# Patient Record
Sex: Male | Born: 1955 | Race: Black or African American | Hispanic: No | Smoking: Light tobacco smoker
Health system: Southern US, Community
[De-identification: ages and names within clinical notes are randomized; demographics above are authoritative.]

## PROBLEM LIST (undated history)

## (undated) DIAGNOSIS — R51 Headache: Secondary | ICD-10-CM

## (undated) DIAGNOSIS — F17200 Nicotine dependence, unspecified, uncomplicated: Secondary | ICD-10-CM

## (undated) DIAGNOSIS — D649 Anemia, unspecified: Secondary | ICD-10-CM

## (undated) DIAGNOSIS — Z86711 Personal history of pulmonary embolism: Secondary | ICD-10-CM

## (undated) DIAGNOSIS — I219 Acute myocardial infarction, unspecified: Secondary | ICD-10-CM

## (undated) DIAGNOSIS — Z992 Dependence on renal dialysis: Secondary | ICD-10-CM

## (undated) DIAGNOSIS — I739 Peripheral vascular disease, unspecified: Secondary | ICD-10-CM

## (undated) DIAGNOSIS — I2510 Atherosclerotic heart disease of native coronary artery without angina pectoris: Secondary | ICD-10-CM

## (undated) DIAGNOSIS — G4730 Sleep apnea, unspecified: Secondary | ICD-10-CM

## (undated) DIAGNOSIS — Z87442 Personal history of urinary calculi: Secondary | ICD-10-CM

## (undated) DIAGNOSIS — I429 Cardiomyopathy, unspecified: Secondary | ICD-10-CM

## (undated) DIAGNOSIS — J45909 Unspecified asthma, uncomplicated: Secondary | ICD-10-CM

## (undated) DIAGNOSIS — T4145XA Adverse effect of unspecified anesthetic, initial encounter: Secondary | ICD-10-CM

## (undated) DIAGNOSIS — I5022 Chronic systolic (congestive) heart failure: Secondary | ICD-10-CM

## (undated) DIAGNOSIS — S98139A Complete traumatic amputation of one unspecified lesser toe, initial encounter: Secondary | ICD-10-CM

## (undated) DIAGNOSIS — K219 Gastro-esophageal reflux disease without esophagitis: Secondary | ICD-10-CM

## (undated) DIAGNOSIS — E785 Hyperlipidemia, unspecified: Secondary | ICD-10-CM

## (undated) DIAGNOSIS — R519 Headache, unspecified: Secondary | ICD-10-CM

## (undated) DIAGNOSIS — N186 End stage renal disease: Secondary | ICD-10-CM

## (undated) DIAGNOSIS — T8859XA Other complications of anesthesia, initial encounter: Secondary | ICD-10-CM

## (undated) DIAGNOSIS — Z89512 Acquired absence of left leg below knee: Secondary | ICD-10-CM

## (undated) DIAGNOSIS — I509 Heart failure, unspecified: Secondary | ICD-10-CM

## (undated) DIAGNOSIS — IMO0001 Reserved for inherently not codable concepts without codable children: Secondary | ICD-10-CM

## (undated) DIAGNOSIS — I10 Essential (primary) hypertension: Secondary | ICD-10-CM

## (undated) DIAGNOSIS — J449 Chronic obstructive pulmonary disease, unspecified: Secondary | ICD-10-CM

## (undated) HISTORY — PX: CARDIAC CATHETERIZATION: SHX172

## (undated) HISTORY — DX: End stage renal disease: N18.6

## (undated) HISTORY — DX: Chronic systolic (congestive) heart failure: I50.22

## (undated) HISTORY — PX: AV FISTULA PLACEMENT: SHX1204

## (undated) HISTORY — DX: Anemia, unspecified: D64.9

## (undated) HISTORY — DX: Essential (primary) hypertension: I10

## (undated) HISTORY — PX: CORONARY ANGIOPLASTY: SHX604

## (undated) HISTORY — DX: Hyperlipidemia, unspecified: E78.5

---

## 2005-10-13 ENCOUNTER — Emergency Department: Payer: Self-pay | Admitting: Emergency Medicine

## 2005-10-13 ENCOUNTER — Other Ambulatory Visit: Payer: Self-pay

## 2005-10-14 ENCOUNTER — Ambulatory Visit: Payer: Self-pay | Admitting: Emergency Medicine

## 2009-06-19 ENCOUNTER — Ambulatory Visit: Payer: Medicare Other | Admitting: Vascular Surgery

## 2009-06-26 ENCOUNTER — Ambulatory Visit: Payer: Medicare Other | Admitting: Vascular Surgery

## 2010-03-19 ENCOUNTER — Ambulatory Visit: Payer: Medicare Other | Admitting: Vascular Surgery

## 2010-04-01 ENCOUNTER — Encounter: Payer: Self-pay | Admitting: Cardiovascular Disease

## 2010-04-14 ENCOUNTER — Ambulatory Visit: Payer: Self-pay | Admitting: Cardiovascular Disease

## 2010-04-14 ENCOUNTER — Inpatient Hospital Stay: Payer: Medicare Other | Admitting: Internal Medicine

## 2010-04-16 ENCOUNTER — Encounter: Payer: Self-pay | Admitting: Internal Medicine

## 2010-04-17 ENCOUNTER — Telehealth: Payer: Self-pay | Admitting: Cardiovascular Disease

## 2010-04-26 ENCOUNTER — Ambulatory Visit: Payer: Self-pay | Admitting: Cardiovascular Disease

## 2010-04-26 DIAGNOSIS — E785 Hyperlipidemia, unspecified: Secondary | ICD-10-CM

## 2010-04-26 DIAGNOSIS — I10 Essential (primary) hypertension: Secondary | ICD-10-CM

## 2010-04-26 DIAGNOSIS — I2510 Atherosclerotic heart disease of native coronary artery without angina pectoris: Secondary | ICD-10-CM

## 2010-05-01 ENCOUNTER — Encounter: Payer: Self-pay | Admitting: Cardiovascular Disease

## 2010-05-09 ENCOUNTER — Telehealth: Payer: Self-pay | Admitting: Cardiovascular Disease

## 2010-06-09 ENCOUNTER — Emergency Department: Payer: Medicare Other | Admitting: Emergency Medicine

## 2010-06-26 NOTE — Letter (Signed)
Summary: External Correspondence-[facility]-EKGreport-Ford Janis  External Correspondence-[facility]KGreport-Othel Ciaramitaro   Imported By: Zenovia Jarred 05/01/2010 12:12:54  _____________________________________________________________________  External Attachment:    Type:   Image     Comment:   External Document

## 2010-06-26 NOTE — Progress Notes (Signed)
  Phone Note Other Incoming   Caller: Dr Rockey Situ Summary of Call: Dr Rockey Situ called pt is being discharged from [facility] today s/p stent placement and needs samples and a prescription for Effient 10mg  sent to [facility] 10[facility]  #14 tablets [id number] A/03/2011 samples left at front desk for pt pick-up along with written instructions on how to take (1 tablet daily) and notification that a rx for Effient has also been sent to the [facility] on [address]. Rx sent electronically to pharmacy. Initial call taken by: Freddrick March RN,  April 17, 2010 9:17 AM    New/Updated Medications: EFFIENT 10 MG TABS (PRASUGREL HCL) Take one tablet by mouth daily Prescriptions: EFFIENT 10 MG TABS (PRASUGREL HCL) Take one tablet by mouth daily  #30 x 6   Entered by:   Freddrick March RN   Authorized by:   Esmond Plants MD   Signed by:   Freddrick March RN on 04/17/2010   Method used:   Electronically to        [facility]. [place] (retail)       [address] [place], [place]  [address]       Ph: [phone]       Fax: [phone]   RxID:   [id number]

## 2010-06-26 NOTE — Consult Note (Signed)
Summary: Consultation Report-[facility]  Consultation Report-[facility]   Imported By: Zenovia Jarred 05/01/2010 12:20:14  _____________________________________________________________________  External Attachment:    Type:   Image     Comment:   External Document

## 2010-06-26 NOTE — Assessment & Plan Note (Signed)
Summary: NP6/AMD   Visit Type:  Initial Consult Primary Ajeet Casasola:  Princella Ion  CC:  f/u from hospital. Denies chest pain or palpitations. occasional SOB.[name]  History of Present Illness: 55 yo male with diabetes, ESRD on HD, hyperlipidemia, presenting for follow up after a recent hospitalization for chest pain, cardiac cath at [facility] by myself showing severe proximal RCA disease, moderate LAD disease with PCI of the RCA with a DES stent.  He states that he has been doing well. He denies any chest pain. No SOB. he has not had chest pain with dialysis as he was having prior to the PCI. He now holds his blood pressure medication prior to dialysis and his BP does not drop as low. He goes to [facility] dialysis.  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   No recent cholesterol panel available. Currently on simva 20 mg daily  Current Medications (verified): 1)  Effient 10 Mg Tabs (Prasugrel Hcl) .... Take One Tablet By Mouth Daily 2)  Renagel 800 Mg Tabs (Sevelamer Hcl) .... 2 Tablets Three Times A Day 3)  Simvastatin 20 Mg Tabs (Simvastatin) .[name].. 1 Tablet Once Daily 4)  Isosorbide Dinitrate 20 Mg Tabs (Isosorbide Dinitrate) .[name].. 1 Tablet Three Times A Day 5)  Hydralazine Hcl 50 Mg Tabs (Hydralazine Hcl) .[name].. 1 Tablet Three Times A Day 6)  Furosemide 40 Mg Tabs (Furosemide) .[name].. 1 Tablet Two Times A Day 7)  Aspirin 325 Mg Tabs (Aspirin) .[name].. 1 Tablet Once Daily 8)  Nasonex 50 Mcg/act Susp (Mometasone Furoate) .[name].. 1 Spray Once Daily 9)  Gabapentin 100 Mg Caps (Gabapentin) .[name].. 1 Tablet At Bedtime 10)  Metoprolol Tartrate 25 Mg Tabs (Metoprolol Tartrate) .[name].. 1 Tablet Two Times A Day 11)  Nicotine 14 Mg/24hr Pt24 (Nicotine) .... Top Once Daily 12)  Nicotine .... Inhale 1 Every Hour 13)  Pepcid 20 Mg Tabs (Famotidine) .[name].. 1 Tablet Two Times A Day  Allergies (verified): No Known Drug Allergies  Past History:  Past Medical  History: Last updated: 04/18/2010 End-stage renal disease Hypertension Hyperlipidemia Anemia Hyperparathyroidism  Past Surgical History: Last updated: 04/18/2010 Cardiac Catheterization-stent placement  Family History: Last updated: 04/18/2010 FH-congestive heart failure,leukemia, hypertension, and diabetes mellitus.  Social History: Last updated: 04/18/2010 Engaged Tobacco Use - Yes, 5 cigarettes per day.  Alcohol Use - no Drug Use - no, has used crack cocaine in past  Risk Factors: Smoking Status: current (04/18/2010)  Review of Systems  The patient denies fever, weight loss, weight gain, vision loss, decreased hearing, hoarseness, chest pain, syncope, dyspnea on exertion, peripheral edema, prolonged cough, abdominal pain, incontinence, muscle weakness, depression, and enlarged lymph nodes.    Vital Signs:  Patient profile:   55 year old male Height:      75 inches Weight:      234.50 pounds BMI:     29.42 Pulse rate:   75 / minute BP sitting:   138 / 64  (left arm) Cuff size:   regular  Vitals Entered By: Rodman Comp CMA (April 26, 2010 3:26 PM)  Physical Exam  General:  Well developed, well nourished, in no acute distress. Head:  normocephalic and atraumatic Neck:  Neck supple, no JVD. No masses, thyromegaly or abnormal cervical nodes. Lungs:  Clear bilaterally to auscultation and percussion. Heart:  Non-displaced PMI, chest non-tender; regular rate and rhythm, S1, S2 without murmurs, rubs or gallops. Carotid upstroke normal, no bruit.Pedals normal pulses. No edema,  no varicosities. Abdomen:  Bowel sounds positive; abdomen soft and non-tender without masses Msk:  Back normal, normal gait. Muscle strength and tone normal. Pulses:  pulses normal in all 4 extremities Extremities:  No clubbing or cyanosis. Neurologic:  Alert and oriented x 3. Skin:  Intact without lesions or rashes. Psych:  Normal affect.   Impression & Recommendations:  Problem # 1:   CAD, NATIVE VESSEL (ICD-414.01) No further episodes of chest pain afer PCI. Will encourage aggressive lipid management. Will need to obtain a cholesterol from [place] in 2 months.  Goal LDL <70  His updated medication list for this problem includes:    Effient 10 Mg Tabs (Prasugrel hcl) .[name]... Take one tablet by mouth daily    Isosorbide Dinitrate 20 Mg Tabs (Isosorbide dinitrate) .[name]... 1 tablet three times a day    Aspirin 325 Mg Tabs (Aspirin) .[name]... 1 tablet once daily    Metoprolol Tartrate 25 Mg Tabs (Metoprolol tartrate) .[name]... 1 tablet two times a day    Nitrostat 0.4 Mg Subl (Nitroglycerin) .[name]... 1 tablet under tongue at onset of chest pain; you may repeat every 5 minutes for up to 3 doses.  Problem # 2:  [id number] ([id number]) Goal LDL <70. Review labs in 2 months from [facility]  His updated medication list for this problem includes:    Simvastatin 20 Mg Tabs (Simvastatin) .[name]... 1 tablet once daily  Problem # 3:  END STAGE RENAL DISEASE (ICD-585.6) Tolerating dialysis well after his PCI. BP is mildly elevated days between dialysis.  Problem # 4:  HYPERTENSION, BENIGN (ICD-401.1) BP has improved on his current medications that were started in the hospital.  Will continue current meds. have asked him to call our office for routine SBP >150  His updated medication list for this problem includes:    Hydralazine Hcl 50 Mg Tabs (Hydralazine hcl) .[name]... 1 tablet three times a day    Furosemide 40 Mg Tabs (Furosemide) .[name]... 1 tablet two times a day    Aspirin 325 Mg Tabs (Aspirin) .[name]... 1 tablet once daily    Metoprolol Tartrate 25 Mg Tabs (Metoprolol tartrate) .[name]... 1 tablet two times a day  Patient Instructions: 1)  Your physician recommends that you schedule a follow-up appointment in: 6 months 2)  Your physician has recommended you make the following change in your medication: Take Nitroglycerin SL 0.4mg  as needed for chest pain Prescriptions: NITROSTAT 0.4 MG  SUBL (NITROGLYCERIN) 1 tablet under tongue at onset of chest pain; you may repeat every 5 minutes for up to 3 doses.  #25 x 3   Entered by:   Freddrick March RN   Authorized by:   Esmond Plants MD   Signed by:   Freddrick March RN on 04/26/2010   Method used:   Electronically to        [facility]. [place] (retail)       [address] [place], [place]  [address]       Ph: [phone]       Fax: [phone]   RxID:   [id number]   Appended Document: NP6/AMD EKG shows normal sinus rhythm with rate 75 beats per minute, T-wave abnormality in V3 through V6, 2, 3, aVF, left axis deviation

## 2010-06-26 NOTE — Cardiovascular Report (Signed)
Summary: [facility] Cath Report   Imported By: Sallee Provencal 05/02/2010 15:33:17  _____________________________________________________________________  External Attachment:    Type:   Image     Comment:   External Document

## 2010-06-26 NOTE — Letter (Signed)
Summary: External [facility]ence-[facility]-Cardiac Cath Results-[facility] N. McNe  External [facility]-Cardiac Cath Results-Hansen Cora Daniels   Imported By: Zenovia Jarred 05/01/2010 12:08:38  _____________________________________________________________________  External Attachment:    Type:   Image     Comment:   External Document

## 2010-06-28 NOTE — Progress Notes (Signed)
Summary: Nose bleed  Phone Note Call from Patient Call back at [phone]   Caller: Self Call For: Gollan Summary of Call: Pt is on coumadin and his nose has been bleeding for the past 2 days and clotting. Initial call taken by: Zenovia Jarred,  May 09, 2010 1:57 PM  Follow-up for Phone Call        Pt had stent placed 2 weeks ago, he is currently taking Effient. He states nose on one side has been bleeding. He packs it with a cloth and this seems to help temporarily, however, it will restart. Pt instructed to continue Effient as he just had stent placed and if bleeding is continuous, may need to go to an urgent care or ER to possibly pack or cauterize area of the bleed. Follow-up by: Darlyne Russian RN,  May 09, 2010 3:17 PM

## 2010-12-12 ENCOUNTER — Encounter: Payer: Self-pay | Admitting: Cardiology

## 2011-08-02 ENCOUNTER — Observation Stay: Payer: Self-pay | Admitting: Surgery

## 2011-08-02 DIAGNOSIS — R748 Abnormal levels of other serum enzymes: Secondary | ICD-10-CM

## 2011-08-02 LAB — COMPREHENSIVE METABOLIC PANEL
Alkaline Phosphatase: 94 U/L (ref 50–136)
BUN: 41 mg/dL — ABNORMAL HIGH (ref 7–18)
Bilirubin,Total: 0.5 mg/dL (ref 0.2–1.0)
Calcium, Total: 9.1 mg/dL (ref 8.5–10.1)
Chloride: 97 mmol/L — ABNORMAL LOW (ref 98–107)
Co2: 28 mmol/L (ref 21–32)
EGFR (Non-African Amer.): 7 — ABNORMAL LOW
Osmolality: 288 (ref 275–301)
Potassium: 4.6 mmol/L (ref 3.5–5.1)
SGPT (ALT): 67 U/L
Sodium: 139 mmol/L (ref 136–145)

## 2011-08-02 LAB — URINALYSIS, COMPLETE
Bilirubin,UR: NEGATIVE
Granular Cast: 3
Hyaline Cast: 7
Ketone: NEGATIVE
Ph: 5 (ref 4.5–8.0)
RBC,UR: 2 /HPF (ref 0–5)
Specific Gravity: 1.016 (ref 1.003–1.030)
Squamous Epithelial: 3

## 2011-08-02 LAB — TROPONIN I: Troponin-I: 0.14 ng/mL — ABNORMAL HIGH

## 2011-08-02 LAB — CBC
HCT: 47.5 % (ref 40.0–52.0)
HGB: 15.2 g/dL (ref 13.0–18.0)
MCV: 92 fL (ref 80–100)
RBC: 5.16 10*6/uL (ref 4.40–5.90)
RDW: 13.4 % (ref 11.5–14.5)
WBC: 6 10*3/uL (ref 3.8–10.6)

## 2011-08-02 LAB — CK TOTAL AND CKMB (NOT AT ARMC)
CK, Total: 685 U/L — ABNORMAL HIGH (ref 35–232)
CK, Total: 573 U/L — ABNORMAL HIGH (ref 35–232)
CK-MB: 5.6 ng/mL — ABNORMAL HIGH (ref 0.5–3.6)

## 2011-08-02 LAB — PRO B NATRIURETIC PEPTIDE: B-Type Natriuretic Peptide: 296 pg/mL — ABNORMAL HIGH (ref 0–125)

## 2011-08-03 LAB — TROPONIN I: Troponin-I: 0.14 ng/mL — ABNORMAL HIGH

## 2011-08-05 ENCOUNTER — Emergency Department: Payer: Self-pay | Admitting: *Deleted

## 2011-08-28 ENCOUNTER — Emergency Department: Payer: Self-pay | Admitting: Emergency Medicine

## 2012-09-20 ENCOUNTER — Emergency Department: Payer: Self-pay | Admitting: Emergency Medicine

## 2012-11-27 ENCOUNTER — Emergency Department: Payer: Self-pay | Admitting: Emergency Medicine

## 2012-11-27 LAB — CBC
HGB: 10.8 g/dL — ABNORMAL LOW (ref 13.0–18.0)
MCHC: 33.2 g/dL (ref 32.0–36.0)
MCV: 91 fL (ref 80–100)
Platelet: 170 10*3/uL (ref 150–440)
RBC: 3.55 10*6/uL — ABNORMAL LOW (ref 4.40–5.90)

## 2012-11-27 LAB — BASIC METABOLIC PANEL
Anion Gap: 10 (ref 7–16)
Calcium, Total: 8.1 mg/dL — ABNORMAL LOW (ref 8.5–10.1)
Chloride: 104 mmol/L (ref 98–107)
Creatinine: 18.65 mg/dL — ABNORMAL HIGH (ref 0.60–1.30)
EGFR (African American): 3 — ABNORMAL LOW
Glucose: 90 mg/dL (ref 65–99)
Sodium: 139 mmol/L (ref 136–145)

## 2012-11-27 LAB — CK TOTAL AND CKMB (NOT AT ARMC): CK, Total: 625 U/L — ABNORMAL HIGH (ref 35–232)

## 2013-04-01 ENCOUNTER — Ambulatory Visit: Payer: Self-pay | Admitting: Family Medicine

## 2013-04-08 ENCOUNTER — Ambulatory Visit: Payer: Self-pay | Admitting: Family Medicine

## 2013-05-11 ENCOUNTER — Ambulatory Visit: Payer: Self-pay | Admitting: Gastroenterology

## 2013-05-12 LAB — PATHOLOGY REPORT

## 2013-07-06 ENCOUNTER — Inpatient Hospital Stay: Payer: Self-pay | Admitting: Internal Medicine

## 2013-07-06 DIAGNOSIS — I059 Rheumatic mitral valve disease, unspecified: Secondary | ICD-10-CM

## 2013-07-06 LAB — COMPREHENSIVE METABOLIC PANEL
Albumin: 2.9 g/dL — ABNORMAL LOW (ref 3.4–5.0)
Alkaline Phosphatase: 79 U/L
Anion Gap: 10 (ref 7–16)
BUN: 80 mg/dL — AB (ref 7–18)
Bilirubin,Total: 1.1 mg/dL — ABNORMAL HIGH (ref 0.2–1.0)
CALCIUM: 9.1 mg/dL (ref 8.5–10.1)
CO2: 24 mmol/L (ref 21–32)
Chloride: 102 mmol/L (ref 98–107)
Creatinine: 15.92 mg/dL — ABNORMAL HIGH (ref 0.60–1.30)
EGFR (African American): 3 — ABNORMAL LOW
GFR CALC NON AF AMER: 3 — AB
Glucose: 115 mg/dL — ABNORMAL HIGH (ref 65–99)
Osmolality: 297 (ref 275–301)
POTASSIUM: 4.9 mmol/L (ref 3.5–5.1)
SGOT(AST): 44 U/L — ABNORMAL HIGH (ref 15–37)
SGPT (ALT): 31 U/L (ref 12–78)
Sodium: 136 mmol/L (ref 136–145)
TOTAL PROTEIN: 7.2 g/dL (ref 6.4–8.2)

## 2013-07-06 LAB — CBC
HCT: 40.5 % (ref 40.0–52.0)
HGB: 13.4 g/dL (ref 13.0–18.0)
MCH: 31.4 pg (ref 26.0–34.0)
MCHC: 33 g/dL (ref 32.0–36.0)
MCV: 95 fL (ref 80–100)
Platelet: 107 10*3/uL — ABNORMAL LOW (ref 150–440)
RBC: 4.26 10*6/uL — ABNORMAL LOW (ref 4.40–5.90)
RDW: 16.6 % — ABNORMAL HIGH (ref 11.5–14.5)
WBC: 4.4 10*3/uL (ref 3.8–10.6)

## 2013-07-06 LAB — URINALYSIS, COMPLETE
BILIRUBIN, UR: NEGATIVE
KETONE: NEGATIVE
NITRITE: NEGATIVE
PH: 6 (ref 4.5–8.0)
Specific Gravity: 1.017 (ref 1.003–1.030)

## 2013-07-06 LAB — TROPONIN I
Troponin-I: 0.55 ng/mL — ABNORMAL HIGH
Troponin-I: 0.53 ng/mL — ABNORMAL HIGH

## 2013-07-06 LAB — PROTIME-INR
INR: 1.2
Prothrombin Time: 15.2 secs — ABNORMAL HIGH (ref 11.5–14.7)

## 2013-07-06 LAB — APTT: Activated PTT: 30.5 secs (ref 23.6–35.9)

## 2013-07-06 LAB — PRO B NATRIURETIC PEPTIDE: B-Type Natriuretic Peptide: >175000 pg/mL — ABNORMAL HIGH (ref 0–125)

## 2013-07-06 LAB — CK-MB
CK-MB: 2.8 ng/mL (ref 0.5–3.6)
CK-MB: 3.4 ng/mL (ref 0.5–3.6)

## 2013-07-06 LAB — CK TOTAL AND CKMB (NOT AT ARMC)
CK, TOTAL: 276 U/L
CK-MB: 2.8 ng/mL (ref 0.5–3.6)

## 2013-07-07 LAB — MAGNESIUM: Magnesium: 2.5 mg/dL — ABNORMAL HIGH

## 2013-07-07 LAB — PHOSPHORUS: Phosphorus: 7.9 mg/dL — ABNORMAL HIGH (ref 2.5–4.9)

## 2013-07-07 LAB — HEMOGLOBIN A1C: HEMOGLOBIN A1C: 6.1 % (ref 4.2–6.3)

## 2013-07-07 LAB — PLATELET COUNT: Platelet: 107 10*3/uL — ABNORMAL LOW (ref 150–440)

## 2013-07-08 LAB — CBC WITH DIFFERENTIAL/PLATELET
BASOS ABS: 0 10*3/uL (ref 0.0–0.1)
Basophil %: 1 %
EOS PCT: 3.5 %
Eosinophil #: 0.2 10*3/uL (ref 0.0–0.7)
HCT: 37.3 % — ABNORMAL LOW (ref 40.0–52.0)
HGB: 12.3 g/dL — ABNORMAL LOW (ref 13.0–18.0)
LYMPHS ABS: 1.3 10*3/uL (ref 1.0–3.6)
Lymphocyte %: 28.7 %
MCH: 31.1 pg (ref 26.0–34.0)
MCHC: 33 g/dL (ref 32.0–36.0)
MCV: 95 fL (ref 80–100)
Monocyte #: 0.9 x10 3/mm (ref 0.2–1.0)
Monocyte %: 19.8 %
Neutrophil #: 2.2 10*3/uL (ref 1.4–6.5)
Neutrophil %: 47 %
Platelet: 104 10*3/uL — ABNORMAL LOW (ref 150–440)
RBC: 3.95 10*6/uL — ABNORMAL LOW (ref 4.40–5.90)
RDW: 16.3 % — ABNORMAL HIGH (ref 11.5–14.5)
WBC: 4.6 10*3/uL (ref 3.8–10.6)

## 2013-07-08 LAB — BASIC METABOLIC PANEL
Anion Gap: 11 (ref 7–16)
BUN: 64 mg/dL — AB (ref 7–18)
CALCIUM: 8.8 mg/dL (ref 8.5–10.1)
CHLORIDE: 100 mmol/L (ref 98–107)
Co2: 27 mmol/L (ref 21–32)
Creatinine: 13.17 mg/dL — ABNORMAL HIGH (ref 0.60–1.30)
EGFR (African American): 4 — ABNORMAL LOW
EGFR (Non-African Amer.): 4 — ABNORMAL LOW
Glucose: 99 mg/dL (ref 65–99)
Osmolality: 294 (ref 275–301)
Potassium: 4.4 mmol/L (ref 3.5–5.1)
Sodium: 138 mmol/L (ref 136–145)

## 2013-07-10 LAB — URINE CULTURE

## 2013-09-15 ENCOUNTER — Inpatient Hospital Stay: Payer: Self-pay | Admitting: Internal Medicine

## 2013-09-15 LAB — CBC
HCT: 41.7 % (ref 40.0–52.0)
HGB: 13.6 g/dL (ref 13.0–18.0)
MCH: 31.8 pg (ref 26.0–34.0)
MCHC: 32.6 g/dL (ref 32.0–36.0)
MCV: 98 fL (ref 80–100)
Platelet: 146 10*3/uL — ABNORMAL LOW (ref 150–440)
RBC: 4.27 10*6/uL — ABNORMAL LOW (ref 4.40–5.90)
RDW: 16.9 % — AB (ref 11.5–14.5)
WBC: 6.1 10*3/uL (ref 3.8–10.6)

## 2013-09-15 LAB — PHOSPHORUS: PHOSPHORUS: 6.5 mg/dL — AB (ref 2.5–4.9)

## 2013-09-15 LAB — CK-MB
CK-MB: 3.1 ng/mL (ref 0.5–3.6)
CK-MB: 3 ng/mL (ref 0.5–3.6)
CK-MB: 2.8 ng/mL (ref 0.5–3.6)

## 2013-09-15 LAB — COMPREHENSIVE METABOLIC PANEL
ALBUMIN: 3.2 g/dL — AB (ref 3.4–5.0)
ALT: 32 U/L (ref 12–78)
AST: 46 U/L — AB (ref 15–37)
Alkaline Phosphatase: 117 U/L
Anion Gap: 8 (ref 7–16)
BILIRUBIN TOTAL: 1 mg/dL (ref 0.2–1.0)
BUN: 47 mg/dL — AB (ref 7–18)
CO2: 26 mmol/L (ref 21–32)
CREATININE: 10.36 mg/dL — AB (ref 0.60–1.30)
Calcium, Total: 9.4 mg/dL (ref 8.5–10.1)
Chloride: 105 mmol/L (ref 98–107)
EGFR (African American): 6 — ABNORMAL LOW
EGFR (Non-African Amer.): 5 — ABNORMAL LOW
GLUCOSE: 98 mg/dL (ref 65–99)
OSMOLALITY: 290 (ref 275–301)
POTASSIUM: 4.2 mmol/L (ref 3.5–5.1)
SODIUM: 139 mmol/L (ref 136–145)
Total Protein: 7.8 g/dL (ref 6.4–8.2)

## 2013-09-15 LAB — TROPONIN I: Troponin-I: 0.57 ng/mL — ABNORMAL HIGH

## 2013-09-15 LAB — PRO B NATRIURETIC PEPTIDE: B-Type Natriuretic Peptide: 145464 pg/mL — ABNORMAL HIGH (ref 0–125)

## 2013-09-16 LAB — APTT: ACTIVATED PTT: 33.1 s (ref 23.6–35.9)

## 2013-11-25 ENCOUNTER — Ambulatory Visit: Payer: Self-pay | Admitting: Vascular Surgery

## 2013-11-25 LAB — CBC WITH DIFFERENTIAL/PLATELET
BASOS ABS: 0.1 10*3/uL (ref 0.0–0.1)
BASOS PCT: 1.3 %
Eosinophil #: 0.3 10*3/uL (ref 0.0–0.7)
Eosinophil %: 4.1 %
HCT: 36.9 % — ABNORMAL LOW (ref 40.0–52.0)
HGB: 12.1 g/dL — AB (ref 13.0–18.0)
Lymphocyte #: 1.8 10*3/uL (ref 1.0–3.6)
Lymphocyte %: 25.9 %
MCH: 31.5 pg (ref 26.0–34.0)
MCHC: 32.7 g/dL (ref 32.0–36.0)
MCV: 96 fL (ref 80–100)
Monocyte #: 1.2 x10 3/mm — ABNORMAL HIGH (ref 0.2–1.0)
Monocyte %: 17.4 %
Neutrophil #: 3.5 10*3/uL (ref 1.4–6.5)
Neutrophil %: 51.3 %
Platelet: 133 10*3/uL — ABNORMAL LOW (ref 150–440)
RBC: 3.83 10*6/uL — ABNORMAL LOW (ref 4.40–5.90)
RDW: 14.9 % — ABNORMAL HIGH (ref 11.5–14.5)
WBC: 6.8 10*3/uL (ref 3.8–10.6)

## 2013-11-25 LAB — COMPREHENSIVE METABOLIC PANEL
ALBUMIN: 2.6 g/dL — AB (ref 3.4–5.0)
ALK PHOS: 110 U/L
ALT: 26 U/L (ref 12–78)
Anion Gap: 10 (ref 7–16)
BUN: 46 mg/dL — ABNORMAL HIGH (ref 7–18)
Bilirubin,Total: 0.8 mg/dL (ref 0.2–1.0)
CHLORIDE: 106 mmol/L (ref 98–107)
CREATININE: 12.36 mg/dL — AB (ref 0.60–1.30)
Calcium, Total: 8.9 mg/dL (ref 8.5–10.1)
Co2: 25 mmol/L (ref 21–32)
EGFR (African American): 5 — ABNORMAL LOW
EGFR (Non-African Amer.): 4 — ABNORMAL LOW
Glucose: 81 mg/dL (ref 65–99)
Osmolality: 292 (ref 275–301)
Potassium: 3.8 mmol/L (ref 3.5–5.1)
SGOT(AST): 38 U/L — ABNORMAL HIGH (ref 15–37)
SODIUM: 141 mmol/L (ref 136–145)
TOTAL PROTEIN: 7 g/dL (ref 6.4–8.2)

## 2013-11-25 LAB — PROTIME-INR
INR: 1.2
PROTHROMBIN TIME: 14.7 s (ref 11.5–14.7)

## 2013-12-07 ENCOUNTER — Ambulatory Visit: Payer: Self-pay | Admitting: Vascular Surgery

## 2013-12-07 LAB — CBC WITH DIFFERENTIAL/PLATELET
BASOS ABS: 0.1 10*3/uL (ref 0.0–0.1)
Basophil %: 1.1 %
Eosinophil #: 0.2 10*3/uL (ref 0.0–0.7)
Eosinophil %: 3 %
HCT: 33.3 % — ABNORMAL LOW (ref 40.0–52.0)
HGB: 10.6 g/dL — AB (ref 13.0–18.0)
LYMPHS ABS: 1.6 10*3/uL (ref 1.0–3.6)
Lymphocyte %: 24.7 %
MCH: 31.2 pg (ref 26.0–34.0)
MCHC: 31.8 g/dL — AB (ref 32.0–36.0)
MCV: 98 fL (ref 80–100)
Monocyte #: 0.9 x10 3/mm (ref 0.2–1.0)
Monocyte %: 14.7 %
Neutrophil #: 3.5 10*3/uL (ref 1.4–6.5)
Neutrophil %: 56.5 %
PLATELETS: 187 10*3/uL (ref 150–440)
RBC: 3.39 10*6/uL — ABNORMAL LOW (ref 4.40–5.90)
RDW: 16.1 % — AB (ref 11.5–14.5)
WBC: 6.3 10*3/uL (ref 3.8–10.6)

## 2013-12-07 LAB — BASIC METABOLIC PANEL
ANION GAP: 8 (ref 7–16)
BUN: 34 mg/dL — ABNORMAL HIGH (ref 7–18)
CALCIUM: 9 mg/dL (ref 8.5–10.1)
CO2: 28 mmol/L (ref 21–32)
Chloride: 104 mmol/L (ref 98–107)
Creatinine: 9.73 mg/dL — ABNORMAL HIGH (ref 0.60–1.30)
EGFR (African American): 6 — ABNORMAL LOW
EGFR (Non-African Amer.): 5 — ABNORMAL LOW
Glucose: 92 mg/dL (ref 65–99)
OSMOLALITY: 287 (ref 275–301)
Potassium: 4 mmol/L (ref 3.5–5.1)
Sodium: 140 mmol/L (ref 136–145)

## 2013-12-16 ENCOUNTER — Ambulatory Visit: Payer: Self-pay | Admitting: Vascular Surgery

## 2014-02-10 ENCOUNTER — Emergency Department: Payer: Self-pay | Admitting: Emergency Medicine

## 2014-02-10 ENCOUNTER — Ambulatory Visit: Payer: Self-pay | Admitting: Vascular Surgery

## 2014-02-10 LAB — CBC WITH DIFFERENTIAL/PLATELET
Basophil #: 0 10*3/uL (ref 0.0–0.1)
Basophil %: 0.7 %
Eosinophil #: 0.1 10*3/uL (ref 0.0–0.7)
Eosinophil %: 0.9 %
HCT: 40.2 % (ref 40.0–52.0)
HGB: 12.4 g/dL — ABNORMAL LOW (ref 13.0–18.0)
Lymphocyte #: 1.1 10*3/uL (ref 1.0–3.6)
Lymphocyte %: 14.5 %
MCH: 30 pg (ref 26.0–34.0)
MCHC: 30.9 g/dL — ABNORMAL LOW (ref 32.0–36.0)
MCV: 97 fL (ref 80–100)
Monocyte #: 0.9 x10 3/mm (ref 0.2–1.0)
Monocyte %: 12.7 %
NEUTROS ABS: 5.2 10*3/uL (ref 1.4–6.5)
Neutrophil %: 71.2 %
Platelet: 184 10*3/uL (ref 150–440)
RBC: 4.14 10*6/uL — AB (ref 4.40–5.90)
RDW: 14.5 % (ref 11.5–14.5)
WBC: 7.3 10*3/uL (ref 3.8–10.6)

## 2014-02-10 LAB — COMPREHENSIVE METABOLIC PANEL
ANION GAP: 11 (ref 7–16)
Albumin: 3 g/dL — ABNORMAL LOW (ref 3.4–5.0)
Alkaline Phosphatase: 126 U/L — ABNORMAL HIGH
BUN: 38 mg/dL — AB (ref 7–18)
Bilirubin,Total: 0.8 mg/dL (ref 0.2–1.0)
CO2: 28 mmol/L (ref 21–32)
CREATININE: 8.73 mg/dL — AB (ref 0.60–1.30)
Calcium, Total: 9.1 mg/dL (ref 8.5–10.1)
Chloride: 101 mmol/L (ref 98–107)
EGFR (Non-African Amer.): 6 — ABNORMAL LOW
GFR CALC AF AMER: 7 — AB
Glucose: 114 mg/dL — ABNORMAL HIGH (ref 65–99)
OSMOLALITY: 289 (ref 275–301)
Potassium: 3.7 mmol/L (ref 3.5–5.1)
SGOT(AST): 45 U/L — ABNORMAL HIGH (ref 15–37)
SGPT (ALT): 26 U/L
Sodium: 140 mmol/L (ref 136–145)
Total Protein: 8.1 g/dL (ref 6.4–8.2)

## 2014-02-13 LAB — AEROBIC CULTURE

## 2014-02-15 LAB — CULTURE, BLOOD (SINGLE)

## 2014-03-02 ENCOUNTER — Emergency Department: Payer: Self-pay | Admitting: Emergency Medicine

## 2014-03-02 LAB — CBC WITH DIFFERENTIAL/PLATELET
BASOS ABS: 0.1 10*3/uL (ref 0.0–0.1)
Basophil %: 0.7 %
Eosinophil #: 0.1 10*3/uL (ref 0.0–0.7)
Eosinophil %: 1.3 %
HCT: 39.8 % — ABNORMAL LOW (ref 40.0–52.0)
HGB: 12.6 g/dL — AB (ref 13.0–18.0)
LYMPHS PCT: 19.3 %
Lymphocyte #: 1.3 10*3/uL (ref 1.0–3.6)
MCH: 30.2 pg (ref 26.0–34.0)
MCHC: 31.7 g/dL — ABNORMAL LOW (ref 32.0–36.0)
MCV: 95 fL (ref 80–100)
Monocyte #: 0.9 x10 3/mm (ref 0.2–1.0)
Monocyte %: 13.6 %
Neutrophil #: 4.5 10*3/uL (ref 1.4–6.5)
Neutrophil %: 65.1 %
Platelet: 184 10*3/uL (ref 150–440)
RBC: 4.19 10*6/uL — AB (ref 4.40–5.90)
RDW: 15.2 % — ABNORMAL HIGH (ref 11.5–14.5)
WBC: 6.9 10*3/uL (ref 3.8–10.6)

## 2014-03-02 LAB — COMPREHENSIVE METABOLIC PANEL
ALK PHOS: 122 U/L — AB
Albumin: 3 g/dL — ABNORMAL LOW (ref 3.4–5.0)
Anion Gap: 6 — ABNORMAL LOW (ref 7–16)
BILIRUBIN TOTAL: 0.7 mg/dL (ref 0.2–1.0)
BUN: 36 mg/dL — ABNORMAL HIGH (ref 7–18)
Calcium, Total: 9.4 mg/dL (ref 8.5–10.1)
Chloride: 104 mmol/L (ref 98–107)
Co2: 28 mmol/L (ref 21–32)
Creatinine: 10.03 mg/dL — ABNORMAL HIGH (ref 0.60–1.30)
EGFR (Non-African Amer.): 6 — ABNORMAL LOW
GFR CALC AF AMER: 7 — AB
GLUCOSE: 83 mg/dL (ref 65–99)
Osmolality: 283 (ref 275–301)
Potassium: 3.8 mmol/L (ref 3.5–5.1)
SGOT(AST): 36 U/L (ref 15–37)
SGPT (ALT): 27 U/L
Sodium: 138 mmol/L (ref 136–145)
Total Protein: 7.7 g/dL (ref 6.4–8.2)

## 2014-03-02 LAB — TROPONIN I: TROPONIN-I: 0.54 ng/mL — AB

## 2014-03-02 LAB — LIPASE, BLOOD: Lipase: 207 U/L (ref 73–393)

## 2014-03-02 LAB — CK-MB
CK-MB: 3.8 ng/mL — ABNORMAL HIGH (ref 0.5–3.6)
CK-MB: 3.6 ng/mL (ref 0.5–3.6)
CK-MB: 3.1 ng/mL (ref 0.5–3.6)

## 2014-03-08 ENCOUNTER — Ambulatory Visit: Payer: Self-pay | Admitting: Surgery

## 2014-03-08 DIAGNOSIS — I10 Essential (primary) hypertension: Secondary | ICD-10-CM

## 2014-03-08 LAB — CBC WITH DIFFERENTIAL/PLATELET
Basophil #: 0 10*3/uL (ref 0.0–0.1)
Basophil %: 0.7 %
EOS ABS: 0.1 10*3/uL (ref 0.0–0.7)
Eosinophil %: 2.7 %
HCT: 40.8 % (ref 40.0–52.0)
HGB: 12.7 g/dL — ABNORMAL LOW (ref 13.0–18.0)
Lymphocyte #: 1 10*3/uL (ref 1.0–3.6)
Lymphocyte %: 18.5 %
MCH: 29.9 pg (ref 26.0–34.0)
MCHC: 31.3 g/dL — ABNORMAL LOW (ref 32.0–36.0)
MCV: 96 fL (ref 80–100)
MONO ABS: 0.8 x10 3/mm (ref 0.2–1.0)
MONOS PCT: 15.2 %
Neutrophil #: 3.4 10*3/uL (ref 1.4–6.5)
Neutrophil %: 62.9 %
Platelet: 161 10*3/uL (ref 150–440)
RBC: 4.26 10*6/uL — ABNORMAL LOW (ref 4.40–5.90)
RDW: 15.5 % — AB (ref 11.5–14.5)
WBC: 5.4 10*3/uL (ref 3.8–10.6)

## 2014-03-08 LAB — BASIC METABOLIC PANEL
ANION GAP: 9 (ref 7–16)
BUN: 42 mg/dL — ABNORMAL HIGH (ref 7–18)
CALCIUM: 9.2 mg/dL (ref 8.5–10.1)
CO2: 29 mmol/L (ref 21–32)
Chloride: 103 mmol/L (ref 98–107)
Creatinine: 11.84 mg/dL — ABNORMAL HIGH (ref 0.60–1.30)
EGFR (African American): 6 — ABNORMAL LOW
GFR CALC NON AF AMER: 5 — AB
Glucose: 105 mg/dL — ABNORMAL HIGH (ref 65–99)
Osmolality: 292 (ref 275–301)
POTASSIUM: 4 mmol/L (ref 3.5–5.1)
SODIUM: 141 mmol/L (ref 136–145)

## 2014-03-08 LAB — HEPATIC FUNCTION PANEL A (ARMC)
ALK PHOS: 128 U/L — AB
AST: 49 U/L — AB (ref 15–37)
Albumin: 2.9 g/dL — ABNORMAL LOW (ref 3.4–5.0)
BILIRUBIN DIRECT: 0.5 mg/dL — AB (ref 0.00–0.20)
Bilirubin,Total: 0.7 mg/dL (ref 0.2–1.0)
SGPT (ALT): 35 U/L
TOTAL PROTEIN: 7.5 g/dL (ref 6.4–8.2)

## 2014-03-08 LAB — PROTIME-INR
INR: 1.1
Prothrombin Time: 14.5 secs (ref 11.5–14.7)

## 2014-03-12 ENCOUNTER — Emergency Department: Payer: Self-pay | Admitting: Emergency Medicine

## 2014-03-12 LAB — COMPREHENSIVE METABOLIC PANEL
ALBUMIN: 2.9 g/dL — AB (ref 3.4–5.0)
ALT: 34 U/L
Alkaline Phosphatase: 142 U/L — ABNORMAL HIGH
Anion Gap: 11 (ref 7–16)
BUN: 39 mg/dL — ABNORMAL HIGH (ref 7–18)
Bilirubin,Total: 0.7 mg/dL (ref 0.2–1.0)
CO2: 28 mmol/L (ref 21–32)
CREATININE: 9.85 mg/dL — AB (ref 0.60–1.30)
Calcium, Total: 9.4 mg/dL (ref 8.5–10.1)
Chloride: 102 mmol/L (ref 98–107)
EGFR (African American): 7 — ABNORMAL LOW
GFR CALC NON AF AMER: 6 — AB
GLUCOSE: 105 mg/dL — AB (ref 65–99)
Osmolality: 291 (ref 275–301)
Potassium: 3.6 mmol/L (ref 3.5–5.1)
SGOT(AST): 40 U/L — ABNORMAL HIGH (ref 15–37)
Sodium: 141 mmol/L (ref 136–145)
Total Protein: 7.7 g/dL (ref 6.4–8.2)

## 2014-03-12 LAB — TROPONIN I: Troponin-I: 0.66 ng/mL — ABNORMAL HIGH

## 2014-03-12 LAB — CBC WITH DIFFERENTIAL/PLATELET
Basophil #: 0.1 10*3/uL (ref 0.0–0.1)
Basophil %: 0.8 %
EOS PCT: 2.2 %
Eosinophil #: 0.1 10*3/uL (ref 0.0–0.7)
HCT: 41.1 % (ref 40.0–52.0)
HGB: 13 g/dL (ref 13.0–18.0)
LYMPHS ABS: 1.1 10*3/uL (ref 1.0–3.6)
Lymphocyte %: 16.8 %
MCH: 30 pg (ref 26.0–34.0)
MCHC: 31.6 g/dL — ABNORMAL LOW (ref 32.0–36.0)
MCV: 95 fL (ref 80–100)
MONO ABS: 1.1 x10 3/mm — AB (ref 0.2–1.0)
Monocyte %: 16.4 %
NEUTROS ABS: 4.2 10*3/uL (ref 1.4–6.5)
NEUTROS PCT: 63.8 %
PLATELETS: 158 10*3/uL (ref 150–440)
RBC: 4.33 10*6/uL — ABNORMAL LOW (ref 4.40–5.90)
RDW: 15.5 % — ABNORMAL HIGH (ref 11.5–14.5)
WBC: 6.6 10*3/uL (ref 3.8–10.6)

## 2014-03-12 LAB — CK-MB: CK-MB: 3.5 ng/mL (ref 0.5–3.6)

## 2014-03-12 LAB — LIPASE, BLOOD: LIPASE: 227 U/L (ref 73–393)

## 2014-03-15 ENCOUNTER — Emergency Department: Payer: Self-pay | Admitting: Emergency Medicine

## 2014-03-15 LAB — CBC WITH DIFFERENTIAL/PLATELET
BASOS ABS: 0 10*3/uL (ref 0.0–0.1)
Basophil %: 0.8 %
Eosinophil #: 0.1 10*3/uL (ref 0.0–0.7)
Eosinophil %: 2.6 %
HCT: 40.9 % (ref 40.0–52.0)
HGB: 12.8 g/dL — AB (ref 13.0–18.0)
LYMPHS ABS: 1.2 10*3/uL (ref 1.0–3.6)
Lymphocyte %: 22.3 %
MCH: 29.8 pg (ref 26.0–34.0)
MCHC: 31.3 g/dL — ABNORMAL LOW (ref 32.0–36.0)
MCV: 95 fL (ref 80–100)
MONOS PCT: 16.9 %
Monocyte #: 0.9 x10 3/mm (ref 0.2–1.0)
NEUTROS ABS: 3 10*3/uL (ref 1.4–6.5)
Neutrophil %: 57.4 %
Platelet: 148 10*3/uL — ABNORMAL LOW (ref 150–440)
RBC: 4.3 10*6/uL — AB (ref 4.40–5.90)
RDW: 15.4 % — ABNORMAL HIGH (ref 11.5–14.5)
WBC: 5.2 10*3/uL (ref 3.8–10.6)

## 2014-03-15 LAB — LIPASE, BLOOD: Lipase: 233 U/L (ref 73–393)

## 2014-03-16 LAB — CK-MB
CK-MB: 3.5 ng/mL (ref 0.5–3.6)
CK-MB: 3.2 ng/mL (ref 0.5–3.6)

## 2014-03-16 LAB — COMPREHENSIVE METABOLIC PANEL
ALK PHOS: 147 U/L — AB
Albumin: 2.9 g/dL — ABNORMAL LOW (ref 3.4–5.0)
Anion Gap: 12 (ref 7–16)
BILIRUBIN TOTAL: 0.7 mg/dL (ref 0.2–1.0)
BUN: 44 mg/dL — ABNORMAL HIGH (ref 7–18)
CHLORIDE: 103 mmol/L (ref 98–107)
CO2: 25 mmol/L (ref 21–32)
CREATININE: 10.08 mg/dL — AB (ref 0.60–1.30)
Calcium, Total: 9.2 mg/dL (ref 8.5–10.1)
EGFR (African American): 7 — ABNORMAL LOW
EGFR (Non-African Amer.): 6 — ABNORMAL LOW
Glucose: 109 mg/dL — ABNORMAL HIGH (ref 65–99)
Osmolality: 291 (ref 275–301)
POTASSIUM: 4 mmol/L (ref 3.5–5.1)
SGOT(AST): 44 U/L — ABNORMAL HIGH (ref 15–37)
SGPT (ALT): 39 U/L
SODIUM: 140 mmol/L (ref 136–145)
Total Protein: 7.6 g/dL (ref 6.4–8.2)

## 2014-03-16 LAB — TROPONIN I: TROPONIN-I: 0.76 ng/mL — AB

## 2014-03-23 ENCOUNTER — Emergency Department (HOSPITAL_COMMUNITY)
Admission: EM | Admit: 2014-03-23 | Discharge: 2014-03-23 | Disposition: A | Discharge status: Home / Self Care | Payer: Medicare Other | Attending: Emergency Medicine | Admitting: Emergency Medicine

## 2014-03-23 ENCOUNTER — Encounter (HOSPITAL_COMMUNITY): Payer: Self-pay | Admitting: Emergency Medicine

## 2014-03-23 DIAGNOSIS — Z720 Tobacco use: Secondary | ICD-10-CM | POA: Insufficient documentation

## 2014-03-23 DIAGNOSIS — M79672 Pain in left foot: Secondary | ICD-10-CM | POA: Diagnosis present

## 2014-03-23 DIAGNOSIS — Z862 Personal history of diseases of the blood and blood-forming organs and certain disorders involving the immune mechanism: Secondary | ICD-10-CM | POA: Insufficient documentation

## 2014-03-23 DIAGNOSIS — E785 Hyperlipidemia, unspecified: Secondary | ICD-10-CM | POA: Insufficient documentation

## 2014-03-23 DIAGNOSIS — Z79899 Other long term (current) drug therapy: Secondary | ICD-10-CM | POA: Insufficient documentation

## 2014-03-23 DIAGNOSIS — L97121 Non-pressure chronic ulcer of left thigh limited to breakdown of skin: Secondary | ICD-10-CM | POA: Diagnosis not present

## 2014-03-23 DIAGNOSIS — T148XXA Other injury of unspecified body region, initial encounter: Secondary | ICD-10-CM

## 2014-03-23 DIAGNOSIS — Z7982 Long term (current) use of aspirin: Secondary | ICD-10-CM | POA: Diagnosis not present

## 2014-03-23 DIAGNOSIS — Z9889 Other specified postprocedural states: Secondary | ICD-10-CM | POA: Diagnosis not present

## 2014-03-23 DIAGNOSIS — I120 Hypertensive chronic kidney disease with stage 5 chronic kidney disease or end stage renal disease: Secondary | ICD-10-CM | POA: Insufficient documentation

## 2014-03-23 DIAGNOSIS — N186 End stage renal disease: Secondary | ICD-10-CM | POA: Diagnosis not present

## 2014-03-23 MED ORDER — HYDROCODONE-ACETAMINOPHEN 5-325 MG PO TABS: 1.0000 | ORAL_TABLET | ORAL | Status: DC | PRN

## 2014-03-23 MED ORDER — OXYCODONE-ACETAMINOPHEN 5-325 MG PO TABS
2.0000 | ORAL_TABLET | Freq: Once | ORAL | Status: AC
  Administered 2014-03-23: 2 via ORAL
  Filled 2014-03-23: qty 2

## 2014-03-23 NOTE — ED Notes (Signed)
Pt remains monitored by blood pressure, pulse ox, and 5 lead. Pts family remains at bedside.  

## 2014-03-23 NOTE — ED Notes (Signed)
Pt placed on monitor upon arrival to room. Pt monitored by blood pressure, pulse ox, and 5 lead. Pts family remains at bedside. RN, Vicente Males at bedside.

## 2014-03-23 NOTE — ED Notes (Signed)
Pt reports tingling and wound to left second and third toes for two weeks.

## 2014-03-23 NOTE — ED Notes (Signed)
Per pt sts left foot pain x a few weeks. sts 3 toes very sensitive to touch and possible ulcer to foot.,

## 2014-03-23 NOTE — ED Provider Notes (Signed)
CSN: [id number]     Arrival date & time 03/23/14  1824 History   First MD Initiated Contact with Patient 03/23/14 2015     Chief Complaint  Patient presents with  . Foot Pain      HPI Patient with a nonhealing wound of his second left toe.  Is been there for approximately 2-1/2 weeks and does not appear to be getting better.  No fevers or chills.  No spreading redness.  He states continued pain and discomfort.  He has not seen his primary care physician about this.  No spreading redness.  He cleaned it once with hydrogen peroxide and since then has been putting antibacterial ointment on it.   Past Medical History  Diagnosis Date  . End stage renal disease   . HTN (hypertension)   . HLD (hyperlipidemia)   . Anemia   . Hyperparathyroidism    Past Surgical History  Procedure Laterality Date  . Cardiac catheterization      stent placement    Family History  Problem Relation Age of Onset  . Heart failure Other   . Hypertension Other   . Leukemia Other   . Diabetes Other    History  Substance Use Topics  . Smoking status: Current Every Day Smoker  . Smokeless tobacco: Not on file     Comment: 5 cigs per day   . Alcohol Use: No    Review of Systems  All other systems reviewed and are negative.     Allergies  Review of patient's allergies indicates no known allergies.  Home Medications   Prior to Admission medications   Medication Sig Start Date End Date Taking? Authorizing Provider  albuterol (PROVENTIL HFA;VENTOLIN HFA) 108 (90 BASE) MCG/ACT inhaler Inhale 1 puff into the lungs every 6 (six) hours as needed for wheezing or shortness of breath.   Yes Historical Provider, MD  aspirin 325 MG tablet Take 325 mg by mouth daily.    Yes Historical Provider, MD  calcium acetate, Phos Binder, (PHOSLYRA) 667 MG/5ML SOLN Take 667 mg by mouth 3 (three) times daily with meals.   Yes Historical Provider, MD  furosemide (LASIX) 80 MG tablet Take 80 mg by mouth 2 (two) times  daily.   Yes Historical Provider, MD  HYDROcodone-acetaminophen (NORCO/VICODIN) 5-325 MG per tablet Take 1 tablet by mouth every 6 (six) hours as needed for moderate pain.   Yes Historical Provider, MD  metoprolol tartrate (LOPRESSOR) 25 MG tablet Take 25 mg by mouth 2 (two) times daily.     Yes Historical Provider, MD  nitroGLYCERIN (NITROSTAT) 0.4 MG SL tablet Place 0.4 mg under the tongue every 5 (five) minutes as needed.     Yes Historical Provider, MD  sevelamer (RENAGEL) 800 MG tablet Take 800 mg by mouth 3 (three) times daily. Take 2 tabs   Yes Historical Provider, MD  simvastatin (ZOCOR) 20 MG tablet Take 20 mg by mouth daily.     Yes Historical Provider, MD  sodium bicarbonate 650 MG tablet Take 650 mg by mouth daily.   Yes Historical Provider, MD  tiotropium (SPIRIVA) 18 MCG inhalation capsule Place 18 mcg into inhaler and inhale daily.   Yes Historical Provider, MD  HYDROcodone-acetaminophen (NORCO/VICODIN) 5-325 MG per tablet Take 1 tablet by mouth every 4 (four) hours as needed for moderate pain. 03/23/14   Hoy Morn, MD   BP 142/87  Pulse 74  Temp(Src) 98.4 F (36.9 C)  Resp 29  SpO2 95% Physical Exam  Nursing note and vitals reviewed. Constitutional: He is oriented to person, place, and time. He appears well-developed and well-nourished.  HENT:  Head: Normocephalic.  Eyes: EOM are normal.  Neck: Normal range of motion.  Pulmonary/Chest: Effort normal.  Abdominal: He exhibits no distension.  Musculoskeletal: Normal range of motion.  Cleaned and dried nonhealing wound of the medial aspect of his second toe alongside the proximal phalanx.  There is no spreading erythema.  Normal pulses in left foot.  Neurological: He is alert and oriented to person, place, and time.  Psychiatric: He has a normal mood and affect.    ED Course  Procedures (including critical care time) Labs Review Labs Reviewed - No data to display  Imaging Review No results found.   EKG  Interpretation None      MDM   Final diagnoses:  Nonhealing nonsurgical wound limited to breakdown of skin    Nonhealing wound.  This will need management and wound care center.  No signs of infection at this time.  He will continue antibacterial ointment.  We will develop a Vaseline gauze protective cover for this until he follows up.    Hoy Morn, MD 03/23/14 2206

## 2014-04-05 ENCOUNTER — Encounter (HOSPITAL_COMMUNITY): Payer: Self-pay

## 2014-04-05 ENCOUNTER — Emergency Department (HOSPITAL_COMMUNITY)
Admission: EM | Admit: 2014-04-05 | Discharge: 2014-04-05 | Disposition: A | Discharge status: Home / Self Care | Payer: Medicare Other | Attending: Emergency Medicine | Admitting: Emergency Medicine

## 2014-04-05 DIAGNOSIS — Z862 Personal history of diseases of the blood and blood-forming organs and certain disorders involving the immune mechanism: Secondary | ICD-10-CM | POA: Insufficient documentation

## 2014-04-05 DIAGNOSIS — Z9889 Other specified postprocedural states: Secondary | ICD-10-CM | POA: Diagnosis not present

## 2014-04-05 DIAGNOSIS — Z4800 Encounter for change or removal of nonsurgical wound dressing: Secondary | ICD-10-CM | POA: Diagnosis present

## 2014-04-05 DIAGNOSIS — E785 Hyperlipidemia, unspecified: Secondary | ICD-10-CM | POA: Diagnosis not present

## 2014-04-05 DIAGNOSIS — N186 End stage renal disease: Secondary | ICD-10-CM | POA: Insufficient documentation

## 2014-04-05 DIAGNOSIS — I120 Hypertensive chronic kidney disease with stage 5 chronic kidney disease or end stage renal disease: Secondary | ICD-10-CM | POA: Insufficient documentation

## 2014-04-05 DIAGNOSIS — Z7982 Long term (current) use of aspirin: Secondary | ICD-10-CM | POA: Diagnosis not present

## 2014-04-05 DIAGNOSIS — Z79899 Other long term (current) drug therapy: Secondary | ICD-10-CM | POA: Insufficient documentation

## 2014-04-05 DIAGNOSIS — Z720 Tobacco use: Secondary | ICD-10-CM | POA: Insufficient documentation

## 2014-04-05 DIAGNOSIS — T148XXA Other injury of unspecified body region, initial encounter: Secondary | ICD-10-CM

## 2014-04-05 MED ORDER — HYDROCODONE-ACETAMINOPHEN 5-325 MG PO TABS: 2.0000 | ORAL_TABLET | ORAL | Status: DC | PRN

## 2014-04-05 MED ORDER — CEPHALEXIN 500 MG PO CAPS: 500.0000 mg | ORAL_CAPSULE | Freq: Four times a day (QID) | ORAL | Status: DC

## 2014-04-05 NOTE — ED Notes (Signed)
Placed vaseline gauze and gkerlix to left great toe/foot as per requested by proveider

## 2014-04-05 NOTE — Discharge Instructions (Signed)
Take Keflex as directed until gone. Take Vicodin as needed for pain. Follow up with the wound clinic as scheduled.

## 2014-04-05 NOTE — ED Notes (Addendum)
Pt here for wound check to left second toe. States it is not healing. No drainage.

## 2014-04-05 NOTE — ED Provider Notes (Signed)
CSN: [id number]     Arrival date & time 04/05/14  1024 History  This chart was scribed for non-physician practitioner, Alvina Chou, PA-C working with Janice Norrie, MD by Frederich Balding, ED scribe. This patient was seen in room TR08C/TR08C and the patient's care was started at 11:31 AM.     Chief Complaint  Patient presents with  . Wound Check   The history is provided by the patient. No language interpreter was used.    HPI Comments: [name] is a 58 y.o. male who presents to the Emergency Department for wound check of left second toe. Pt states it is not healing and pain is starting to shoot into his foot and leg. He was seen on 03/23/14 for the same and was told the wound needed management at the wound care center. States he has called but does not have an appointment until 04/18/14. Denies history of diabetes.   Past Medical History  Diagnosis Date  . End stage renal disease   . HTN (hypertension)   . HLD (hyperlipidemia)   . Anemia   . Hyperparathyroidism    Past Surgical History  Procedure Laterality Date  . Cardiac catheterization      stent placement    Family History  Problem Relation Age of Onset  . Heart failure Other   . Hypertension Other   . Leukemia Other   . Diabetes Other    History  Substance Use Topics  . Smoking status: Current Every Day Smoker  . Smokeless tobacco: Not on file     Comment: 5 cigs per day   . Alcohol Use: No    Review of Systems  Musculoskeletal: Positive for arthralgias.  Skin: Positive for wound.  All other systems reviewed and are negative.  Allergies  Review of patient's allergies indicates no known allergies.  Home Medications   Prior to Admission medications   Medication Sig Start Date End Date Taking? Authorizing Provider  albuterol (PROVENTIL HFA;VENTOLIN HFA) 108 (90 BASE) MCG/ACT inhaler Inhale 1 puff into the lungs every 6 (six) hours as needed for wheezing or shortness of breath.    Historical Provider, MD   aspirin 325 MG tablet Take 325 mg by mouth daily.     Historical Provider, MD  calcium acetate, Phos Binder, (PHOSLYRA) 667 MG/5ML SOLN Take 667 mg by mouth 3 (three) times daily with meals.    Historical Provider, MD  furosemide (LASIX) 80 MG tablet Take 80 mg by mouth 2 (two) times daily.    Historical Provider, MD  HYDROcodone-acetaminophen (NORCO/VICODIN) 5-325 MG per tablet Take 1 tablet by mouth every 6 (six) hours as needed for moderate pain.    Historical Provider, MD  HYDROcodone-acetaminophen (NORCO/VICODIN) 5-325 MG per tablet Take 1 tablet by mouth every 4 (four) hours as needed for moderate pain. 03/23/14   Hoy Morn, MD  metoprolol tartrate (LOPRESSOR) 25 MG tablet Take 25 mg by mouth 2 (two) times daily.      Historical Provider, MD  nitroGLYCERIN (NITROSTAT) 0.4 MG SL tablet Place 0.4 mg under the tongue every 5 (five) minutes as needed.      Historical Provider, MD  sevelamer (RENAGEL) 800 MG tablet Take 800 mg by mouth 3 (three) times daily. Take 2 tabs    Historical Provider, MD  simvastatin (ZOCOR) 20 MG tablet Take 20 mg by mouth daily.      Historical Provider, MD  sodium bicarbonate 650 MG tablet Take 650 mg by mouth daily.  Historical Provider, MD  tiotropium (SPIRIVA) 18 MCG inhalation capsule Place 18 mcg into inhaler and inhale daily.    Historical Provider, MD   BP 135/87 mmHg  Pulse 70  Temp(Src) 98.4 F (36.9 C) (Oral)  Resp 18  Ht 6\' 2"  (1.88 m)  Wt 194 lb (87.998 kg)  BMI 24.90 kg/m2  SpO2 98%   Physical Exam  Constitutional: He is oriented to person, place, and time. He appears well-developed and well-nourished. No distress.  HENT:  Head: Normocephalic and atraumatic.  Eyes: Conjunctivae and EOM are normal.  Neck: Neck supple. No tracheal deviation present.  Cardiovascular: Normal rate.   Pulmonary/Chest: Effort normal. No respiratory distress.  Musculoskeletal: Normal range of motion.  Neurological: He is alert and oriented to person, place,  and time.  Skin: Skin is warm and dry.  There is a 1 cm x 1 cm ulcerative lesion of left great toe with mild surrounding erythema and tenderness to palpation. No red streaking.  Psychiatric: He has a normal mood and affect. His behavior is normal.  Nursing note and vitals reviewed.   ED Course  Procedures (including critical care time)  DIAGNOSTIC STUDIES: Oxygen Saturation is 98% on RA, normal by my interpretation.    COORDINATION OF CARE: 11:33 AM-Discussed treatment plan which includes Vicodin and keflex with pt at bedside and pt agreed to plan. Advised pt to keep his appointment at the wound care center and follow up.   Labs Review Labs Reviewed - No data to display  Imaging Review No results found.   EKG Interpretation None      MDM   Final diagnoses:  Nonhealing nonsurgical wound limited to breakdown of skin    11:40 AM Patient returns for evaluation of the same wound from previous visit. Vitals stable and patient afebrile. Patient instructed to follow up with [facility] as scheduled. Patient will have keflex and vicodin for symptoms.   I personally performed the services described in this documentation, which was scribed in my presence. The recorded information has been reviewed and is accurate.  Alvina Chou, PA-C 04/05/14 [place], MD 04/05/14 1143

## 2014-04-12 ENCOUNTER — Emergency Department: Payer: Self-pay | Admitting: Emergency Medicine

## 2014-04-12 LAB — COMPREHENSIVE METABOLIC PANEL
ALBUMIN: 3 g/dL — AB (ref 3.4–5.0)
Alkaline Phosphatase: 173 U/L — ABNORMAL HIGH
Anion Gap: 10 (ref 7–16)
BUN: 40 mg/dL — AB (ref 7–18)
Bilirubin,Total: 0.8 mg/dL (ref 0.2–1.0)
CALCIUM: 9.5 mg/dL (ref 8.5–10.1)
CO2: 28 mmol/L (ref 21–32)
Chloride: 97 mmol/L — ABNORMAL LOW (ref 98–107)
Creatinine: 9.58 mg/dL — ABNORMAL HIGH (ref 0.60–1.30)
GLUCOSE: 85 mg/dL (ref 65–99)
Osmolality: 279 (ref 275–301)
POTASSIUM: 3.6 mmol/L (ref 3.5–5.1)
SGOT(AST): 38 U/L — ABNORMAL HIGH (ref 15–37)
SGPT (ALT): 30 U/L
SODIUM: 135 mmol/L — AB (ref 136–145)
TOTAL PROTEIN: 8 g/dL (ref 6.4–8.2)

## 2014-04-12 LAB — CBC
HCT: 40.9 % (ref 40.0–52.0)
HGB: 12.8 g/dL — ABNORMAL LOW (ref 13.0–18.0)
MCH: 29.6 pg (ref 26.0–34.0)
MCHC: 31.3 g/dL — ABNORMAL LOW (ref 32.0–36.0)
MCV: 95 fL (ref 80–100)
Platelet: 215 10*3/uL (ref 150–440)
RBC: 4.33 10*6/uL — ABNORMAL LOW (ref 4.40–5.90)
RDW: 15.7 % — ABNORMAL HIGH (ref 11.5–14.5)
WBC: 7.2 10*3/uL (ref 3.8–10.6)

## 2014-04-18 ENCOUNTER — Encounter (HOSPITAL_BASED_OUTPATIENT_CLINIC_OR_DEPARTMENT_OTHER): Payer: Medicare Other

## 2014-04-23 ENCOUNTER — Emergency Department: Payer: Self-pay | Admitting: Emergency Medicine

## 2014-04-27 LAB — WOUND CULTURE

## 2014-05-04 ENCOUNTER — Inpatient Hospital Stay (HOSPITAL_COMMUNITY)
Admission: EM | Admit: 2014-05-04 | Discharge: 2014-05-10 | DRG: 239 | Disposition: A | Discharge status: Skilled Nursing Facility | Payer: Medicare Other | Attending: Internal Medicine | Admitting: Internal Medicine

## 2014-05-04 ENCOUNTER — Encounter (HOSPITAL_COMMUNITY): Payer: Self-pay | Admitting: Emergency Medicine

## 2014-05-04 ENCOUNTER — Emergency Department (HOSPITAL_COMMUNITY): Payer: Medicare Other

## 2014-05-04 ENCOUNTER — Encounter (HOSPITAL_BASED_OUTPATIENT_CLINIC_OR_DEPARTMENT_OTHER): Payer: Medicare Other | Attending: General Surgery

## 2014-05-04 DIAGNOSIS — L03032 Cellulitis of left toe: Secondary | ICD-10-CM | POA: Insufficient documentation

## 2014-05-04 DIAGNOSIS — J45909 Unspecified asthma, uncomplicated: Secondary | ICD-10-CM | POA: Diagnosis present

## 2014-05-04 DIAGNOSIS — I70262 Atherosclerosis of native arteries of extremities with gangrene, left leg: Secondary | ICD-10-CM | POA: Diagnosis not present

## 2014-05-04 DIAGNOSIS — N189 Chronic kidney disease, unspecified: Secondary | ICD-10-CM | POA: Insufficient documentation

## 2014-05-04 DIAGNOSIS — Z79899 Other long term (current) drug therapy: Secondary | ICD-10-CM

## 2014-05-04 DIAGNOSIS — Z955 Presence of coronary angioplasty implant and graft: Secondary | ICD-10-CM

## 2014-05-04 DIAGNOSIS — I96 Gangrene, not elsewhere classified: Secondary | ICD-10-CM | POA: Diagnosis present

## 2014-05-04 DIAGNOSIS — F17210 Nicotine dependence, cigarettes, uncomplicated: Secondary | ICD-10-CM | POA: Diagnosis present

## 2014-05-04 DIAGNOSIS — E1122 Type 2 diabetes mellitus with diabetic chronic kidney disease: Secondary | ICD-10-CM | POA: Diagnosis not present

## 2014-05-04 DIAGNOSIS — E1142 Type 2 diabetes mellitus with diabetic polyneuropathy: Secondary | ICD-10-CM | POA: Diagnosis present

## 2014-05-04 DIAGNOSIS — Z7951 Long term (current) use of inhaled steroids: Secondary | ICD-10-CM | POA: Diagnosis not present

## 2014-05-04 DIAGNOSIS — Z992 Dependence on renal dialysis: Secondary | ICD-10-CM | POA: Diagnosis not present

## 2014-05-04 DIAGNOSIS — E1152 Type 2 diabetes mellitus with diabetic peripheral angiopathy with gangrene: Secondary | ICD-10-CM | POA: Diagnosis present

## 2014-05-04 DIAGNOSIS — Z833 Family history of diabetes mellitus: Secondary | ICD-10-CM

## 2014-05-04 DIAGNOSIS — E785 Hyperlipidemia, unspecified: Secondary | ICD-10-CM | POA: Diagnosis present

## 2014-05-04 DIAGNOSIS — M879 Osteonecrosis, unspecified: Secondary | ICD-10-CM

## 2014-05-04 DIAGNOSIS — Z7982 Long term (current) use of aspirin: Secondary | ICD-10-CM

## 2014-05-04 DIAGNOSIS — Z8249 Family history of ischemic heart disease and other diseases of the circulatory system: Secondary | ICD-10-CM

## 2014-05-04 DIAGNOSIS — E1121 Type 2 diabetes mellitus with diabetic nephropathy: Secondary | ICD-10-CM | POA: Diagnosis not present

## 2014-05-04 DIAGNOSIS — I2510 Atherosclerotic heart disease of native coronary artery without angina pectoris: Secondary | ICD-10-CM | POA: Diagnosis present

## 2014-05-04 DIAGNOSIS — D631 Anemia in chronic kidney disease: Secondary | ICD-10-CM | POA: Diagnosis present

## 2014-05-04 DIAGNOSIS — E875 Hyperkalemia: Secondary | ICD-10-CM | POA: Diagnosis present

## 2014-05-04 DIAGNOSIS — N186 End stage renal disease: Secondary | ICD-10-CM | POA: Diagnosis present

## 2014-05-04 DIAGNOSIS — I10 Essential (primary) hypertension: Secondary | ICD-10-CM | POA: Diagnosis present

## 2014-05-04 DIAGNOSIS — Z89512 Acquired absence of left leg below knee: Secondary | ICD-10-CM | POA: Diagnosis not present

## 2014-05-04 DIAGNOSIS — I120 Hypertensive chronic kidney disease with stage 5 chronic kidney disease or end stage renal disease: Secondary | ICD-10-CM | POA: Diagnosis present

## 2014-05-04 DIAGNOSIS — D649 Anemia, unspecified: Secondary | ICD-10-CM | POA: Diagnosis present

## 2014-05-04 LAB — CBC WITH DIFFERENTIAL/PLATELET
Basophils Absolute: 0 10*3/uL (ref 0.0–0.1)
Basophils Relative: 0 % (ref 0–1)
EOS PCT: 1 % (ref 0–5)
Eosinophils Absolute: 0.2 10*3/uL (ref 0.0–0.7)
HCT: 35.2 % — ABNORMAL LOW (ref 39.0–52.0)
Hemoglobin: 11.8 g/dL — ABNORMAL LOW (ref 13.0–17.0)
LYMPHS ABS: 1.4 10*3/uL (ref 0.7–4.0)
LYMPHS PCT: 12 % (ref 12–46)
MCH: 30.2 pg (ref 26.0–34.0)
MCHC: 33.5 g/dL (ref 30.0–36.0)
MCV: 90 fL (ref 78.0–100.0)
MONO ABS: 1.1 10*3/uL — AB (ref 0.1–1.0)
Monocytes Relative: 9 % (ref 3–12)
Neutro Abs: 9.5 10*3/uL — ABNORMAL HIGH (ref 1.7–7.7)
Neutrophils Relative %: 78 % — ABNORMAL HIGH (ref 43–77)
PLATELETS: 212 10*3/uL (ref 150–400)
RBC: 3.91 MIL/uL — AB (ref 4.22–5.81)
RDW: 17.2 % — ABNORMAL HIGH (ref 11.5–15.5)
WBC: 12.2 10*3/uL — ABNORMAL HIGH (ref 4.0–10.5)

## 2014-05-04 LAB — I-STAT CHEM 8, ED
BUN: 66 mg/dL — AB (ref 6–23)
CALCIUM ION: 1.12 mmol/L — AB (ref 1.13–1.30)
CREATININE: 10.8 mg/dL — AB (ref 0.50–1.35)
Chloride: 102 mEq/L (ref 96–112)
GLUCOSE: 114 mg/dL — AB (ref 70–99)
HCT: 41 % (ref 39.0–52.0)
HEMOGLOBIN: 13.9 g/dL (ref 13.0–17.0)
Potassium: 5.5 mEq/L — ABNORMAL HIGH (ref 3.7–5.3)
Sodium: 135 mEq/L — ABNORMAL LOW (ref 137–147)
TCO2: 22 mmol/L (ref 0–100)

## 2014-05-04 LAB — GLUCOSE, CAPILLARY: Glucose-Capillary: 117 mg/dL — ABNORMAL HIGH (ref 70–99)

## 2014-05-04 LAB — CBG MONITORING, ED
Glucose-Capillary: 111 mg/dL — ABNORMAL HIGH (ref 70–99)
Glucose-Capillary: 64 mg/dL — ABNORMAL LOW (ref 70–99)

## 2014-05-04 MED ORDER — MORPHINE SULFATE 2 MG/ML IJ SOLN
2.0000 mg | INTRAMUSCULAR | Status: DC | PRN
  Administered 2014-05-05 – 2014-05-06 (×9): 2 mg via INTRAVENOUS
  Filled 2014-05-04 (×8): qty 1

## 2014-05-04 MED ORDER — MORPHINE SULFATE 4 MG/ML IJ SOLN
4.0000 mg | Freq: Once | INTRAMUSCULAR | Status: AC
  Administered 2014-05-04: 4 mg via INTRAVENOUS
  Filled 2014-05-04: qty 1

## 2014-05-04 MED ORDER — ALBUTEROL SULFATE (2.5 MG/3ML) 0.083% IN NEBU: 3.0000 mL | INHALATION_SOLUTION | Freq: Four times a day (QID) | RESPIRATORY_TRACT | Status: DC | PRN

## 2014-05-04 MED ORDER — ACETAMINOPHEN 650 MG RE SUPP: 650.0000 mg | Freq: Four times a day (QID) | RECTAL | Status: DC | PRN

## 2014-05-04 MED ORDER — CALCIUM ACETATE (PHOS BINDER) 667 MG/5ML PO SOLN
667.0000 mg | Freq: Three times a day (TID) | ORAL | Status: DC
  Administered 2014-05-05: 667 mg via ORAL
  Filled 2014-05-04 (×4): qty 5

## 2014-05-04 MED ORDER — SODIUM BICARBONATE 650 MG PO TABS
650.0000 mg | ORAL_TABLET | Freq: Every day | ORAL | Status: DC
  Administered 2014-05-05 – 2014-05-08 (×3): 650 mg via ORAL
  Filled 2014-05-04 (×5): qty 1

## 2014-05-04 MED ORDER — TIOTROPIUM BROMIDE MONOHYDRATE 18 MCG IN CAPS
18.0000 ug | ORAL_CAPSULE | Freq: Every day | RESPIRATORY_TRACT | Status: DC
  Administered 2014-05-05 – 2014-05-10 (×5): 18 ug via RESPIRATORY_TRACT
  Filled 2014-05-04 (×2): qty 5

## 2014-05-04 MED ORDER — ONDANSETRON HCL 4 MG/2ML IJ SOLN
4.0000 mg | Freq: Four times a day (QID) | INTRAMUSCULAR | Status: DC | PRN
Start: 2014-05-04 — End: 2014-05-10
  Administered 2014-05-10: 4 mg via INTRAVENOUS
  Filled 2014-05-04: qty 2

## 2014-05-04 MED ORDER — ONDANSETRON HCL 4 MG/2ML IJ SOLN
4.0000 mg | Freq: Once | INTRAMUSCULAR | Status: AC
  Administered 2014-05-04: 4 mg via INTRAVENOUS
  Filled 2014-05-04: qty 2

## 2014-05-04 MED ORDER — SIMVASTATIN 20 MG PO TABS
20.0000 mg | ORAL_TABLET | Freq: Every day | ORAL | Status: DC
  Administered 2014-05-05 – 2014-05-09 (×6): 20 mg via ORAL
  Filled 2014-05-04 (×7): qty 1

## 2014-05-04 MED ORDER — ONDANSETRON HCL 4 MG PO TABS
4.0000 mg | ORAL_TABLET | Freq: Four times a day (QID) | ORAL | Status: DC | PRN
  Administered 2014-05-10: 4 mg via ORAL
  Filled 2014-05-04: qty 1

## 2014-05-04 MED ORDER — ACETAMINOPHEN 325 MG PO TABS
650.0000 mg | ORAL_TABLET | Freq: Four times a day (QID) | ORAL | Status: DC | PRN
  Filled 2014-05-04: qty 2

## 2014-05-04 MED ORDER — SODIUM CHLORIDE 0.9 % IJ SOLN
3.0000 mL | Freq: Two times a day (BID) | INTRAMUSCULAR | Status: DC
  Administered 2014-05-05 – 2014-05-10 (×11): 3 mL via INTRAVENOUS

## 2014-05-04 MED ORDER — INSULIN ASPART 100 UNIT/ML ~~LOC~~ SOLN
0.0000 [IU] | Freq: Three times a day (TID) | SUBCUTANEOUS | Status: DC
  Administered 2014-05-07: 1 [IU] via SUBCUTANEOUS
  Administered 2014-05-09: 2 [IU] via SUBCUTANEOUS
  Administered 2014-05-10: 1 [IU] via SUBCUTANEOUS

## 2014-05-04 MED ORDER — METOPROLOL TARTRATE 25 MG PO TABS
25.0000 mg | ORAL_TABLET | Freq: Two times a day (BID) | ORAL | Status: DC
  Administered 2014-05-05 – 2014-05-10 (×10): 25 mg via ORAL
  Filled 2014-05-04 (×13): qty 1

## 2014-05-04 MED ORDER — VANCOMYCIN HCL IN DEXTROSE 1-5 GM/200ML-% IV SOLN
1000.0000 mg | INTRAVENOUS | Status: AC
  Administered 2014-05-05: 1000 mg via INTRAVENOUS
  Filled 2014-05-04 (×2): qty 200

## 2014-05-04 MED ORDER — BUDESONIDE-FORMOTEROL FUMARATE 160-4.5 MCG/ACT IN AERO
2.0000 | INHALATION_SPRAY | Freq: Two times a day (BID) | RESPIRATORY_TRACT | Status: DC
  Administered 2014-05-05 – 2014-05-10 (×10): 2 via RESPIRATORY_TRACT
  Filled 2014-05-04: qty 6

## 2014-05-04 MED ORDER — HEPARIN SODIUM (PORCINE) 5000 UNIT/ML IJ SOLN
5000.0000 [IU] | Freq: Three times a day (TID) | INTRAMUSCULAR | Status: DC
  Administered 2014-05-05 – 2014-05-06 (×3): 5000 [IU] via SUBCUTANEOUS
  Filled 2014-05-04 (×6): qty 1

## 2014-05-04 MED ORDER — ASPIRIN 325 MG PO TABS
325.0000 mg | ORAL_TABLET | Freq: Every day | ORAL | Status: DC
  Administered 2014-05-05 – 2014-05-10 (×5): 325 mg via ORAL
  Filled 2014-05-04 (×6): qty 1

## 2014-05-04 MED ORDER — SODIUM CHLORIDE 0.9 % IV SOLN
2000.0000 mg | Freq: Once | INTRAVENOUS | Status: AC
  Administered 2014-05-05: 2000 mg via INTRAVENOUS
  Filled 2014-05-04: qty 2000

## 2014-05-04 MED ORDER — PIPERACILLIN-TAZOBACTAM IN DEX 2-0.25 GM/50ML IV SOLN
2.2500 g | Freq: Three times a day (TID) | INTRAVENOUS | Status: DC
  Administered 2014-05-05 – 2014-05-06 (×6): 2.25 g via INTRAVENOUS
  Filled 2014-05-04 (×8): qty 50

## 2014-05-04 MED ORDER — MORPHINE SULFATE 2 MG/ML IJ SOLN
2.0000 mg | Freq: Once | INTRAMUSCULAR | Status: AC
  Administered 2014-05-04: 2 mg via INTRAVENOUS
  Filled 2014-05-04: qty 1

## 2014-05-04 MED ORDER — VANCOMYCIN HCL IN DEXTROSE 1-5 GM/200ML-% IV SOLN
1000.0000 mg | INTRAVENOUS | Status: DC
  Filled 2014-05-04: qty 200

## 2014-05-04 MED ORDER — SEVELAMER CARBONATE 800 MG PO TABS
800.0000 mg | ORAL_TABLET | Freq: Three times a day (TID) | ORAL | Status: DC
  Administered 2014-05-05: 800 mg via ORAL
  Filled 2014-05-04 (×4): qty 1

## 2014-05-04 MED ORDER — FUROSEMIDE 80 MG PO TABS
80.0000 mg | ORAL_TABLET | Freq: Two times a day (BID) | ORAL | Status: DC
  Administered 2014-05-05 – 2014-05-09 (×8): 80 mg via ORAL
  Filled 2014-05-04 (×11): qty 1

## 2014-05-04 MED ORDER — SODIUM POLYSTYRENE SULFONATE 15 GM/60ML PO SUSP
30.0000 g | Freq: Once | ORAL | Status: DC
Start: 2014-05-04 — End: 2014-05-10
  Filled 2014-05-04: qty 120

## 2014-05-04 NOTE — ED Notes (Signed)
Patient transported to X-ray 

## 2014-05-04 NOTE — ED Notes (Signed)
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 Pt is on diaylisis for renal failure.

## 2014-05-04 NOTE — ED Notes (Signed)
Dr. Kakrakandy at bedside. 

## 2014-05-04 NOTE — H&P (Signed)
[facility] History and Physical  [name] [id number] DOB: Feb 12, 1956 DOA: 05/04/2014  Referring physician: ER physician. PCP: Donnie Coffin, MD   Chief Complaint: Left foot pain.  HPI: [name] is a 58 y.o. male with history of ESRD on hemodialysis on Monday Wednesday and Friday, hypertension, hyperlipidemia, CAD status post stenting, diabetes mellitus type 2 was referred to the ER from the wound center after patient was found to have gangrenous foot on the left side. Patient has been having progressively worsening left foot wound over the last 2 months and today was following increasing pain and had gone to the wound center for his regular appointment and over there was found to have gangrenous foot. In the ER patient was evaluated by vascular surgeon Dr. Kellie Simmering who at this time is trying to have amputation of the left leg on Friday, day after tomorrow. Patient will be admitted to [facility]. Patient otherwise denies any chest pain shortness of breath nausea vomiting abdominal pain or diarrhea. Patient has been on long-term antibiotics for the same. On exam patient has left foot gangrene with an ulcer between the first and second toe. Patient has missed his dialysis today has seen to keep up his appointment with wound center.  Review of Systems: As presented in the history of presenting illness, rest negative.  Past Medical History  Diagnosis Date  . End stage renal disease   . HTN (hypertension)   . HLD (hyperlipidemia)   . Anemia   . Hyperparathyroidism   . Diabetes mellitus without complication    Past Surgical History  Procedure Laterality Date  . Cardiac catheterization      stent placement    Social History:  reports that he has been smoking.  He does not have any smokeless tobacco history on file. He reports that he does not drink alcohol or use illicit drugs. Where does patient live home. Can patient participate in ADLs? Yes.  No Known  Allergies  Family History:  Family History  Problem Relation Age of Onset  . Heart failure Other   . Hypertension Other   . Leukemia Other   . Diabetes Other       Prior to Admission medications   Medication Sig Start Date End Date Taking? Authorizing Provider  acetaminophen (TYLENOL) 500 MG tablet Take 1,000 mg by mouth every 6 (six) hours as needed for moderate pain.   Yes Historical Provider, MD  albuterol (PROVENTIL HFA;VENTOLIN HFA) 108 (90 BASE) MCG/ACT inhaler Inhale 1 puff into the lungs every 6 (six) hours as needed for wheezing or shortness of breath.   Yes Historical Provider, MD  aspirin 325 MG tablet Take 325 mg by mouth daily.    Yes Historical Provider, MD  budesonide-formoterol (SYMBICORT) 160-4.5 MCG/ACT inhaler Inhale 2 puffs into the lungs 2 (two) times daily.   Yes Historical Provider, MD  calcium acetate, Phos Binder, (PHOSLYRA) 667 MG/5ML SOLN Take 667 mg by mouth 3 (three) times daily with meals.   Yes Historical Provider, MD  clindamycin (CLEOCIN) 300 MG capsule Take 300 mg by mouth every 6 (six) hours.   Yes Historical Provider, MD  furosemide (LASIX) 80 MG tablet Take 80 mg by mouth 2 (two) times daily.   Yes Historical Provider, MD  metoprolol tartrate (LOPRESSOR) 25 MG tablet Take 25 mg by mouth 2 (two) times daily.     Yes Historical Provider, MD  sevelamer carbonate (RENVELA) 800 MG tablet Take 800 mg by mouth 3 (three) times  daily with meals.   Yes Historical Provider, MD  simvastatin (ZOCOR) 20 MG tablet Take 20 mg by mouth at bedtime.    Yes Historical Provider, MD  sodium bicarbonate 650 MG tablet Take 650 mg by mouth daily.   Yes Historical Provider, MD  tiotropium (SPIRIVA) 18 MCG inhalation capsule Place 18 mcg into inhaler and inhale daily.   Yes Historical Provider, MD  cephALEXin (KEFLEX) 500 MG capsule Take 1 capsule (500 mg total) by mouth 4 (four) times daily. Patient not taking: Reported on 05/04/2014 04/05/14   Alvina Chou, PA-C   HYDROcodone-acetaminophen (NORCO/VICODIN) 5-325 MG per tablet Take 2 tablets by mouth every 4 (four) hours as needed for moderate pain or severe pain. Patient not taking: Reported on 05/04/2014 04/05/14   Alvina Chou, PA-C  levofloxacin (LEVAQUIN) 500 MG tablet Take 500 mg by mouth daily.    Historical Provider, MD  sevelamer (RENAGEL) 800 MG tablet Take 800 mg by mouth 3 (three) times daily. Take 2 tabs    Historical Provider, MD    Physical Exam: Filed Vitals:   05/04/14 1521 05/04/14 1926 05/04/14 1930  BP: 137/86 136/91 139/89  Pulse: 70 65 67  Temp: 98.5 F (36.9 C)    TempSrc: Oral    Resp: 20 20 20   SpO2: 99% 98% 95%     General:  Well-developed and nourished.  Eyes: Anicteric no pallor.  ENT: No discharge from the ears eyes nose or mouth.  Neck: No mass felt.  Cardiovascular: S1-S2 heard.  Respiratory: No rhonchi or crepitations.  Abdomen: Soft nontender bowel sounds present.  Skin: Ulcer on the left foot in between the first and second toe.  Musculoskeletal: Left foot appears gangrenous and cold. Ulcer in between the first and second toe of the left foot.  Psychiatric: Appears normal.  Neurologic: Alert awake oriented to time place and person. Moves all extremities.  Labs on Admission:  Basic Metabolic Panel:  Recent Labs Lab 05/04/14 1737  NA 135*  K 5.5*  CL 102  GLUCOSE 114*  BUN 66*  CREATININE 10.80*   Liver Function Tests: No results for input(s): AST, ALT, ALKPHOS, BILITOT, PROT, ALBUMIN in the last 168 hours. No results for input(s): LIPASE, AMYLASE in the last 168 hours. No results for input(s): AMMONIA in the last 168 hours. CBC:  Recent Labs Lab 05/04/14 1727 05/04/14 1737  WBC 12.2*  --   NEUTROABS 9.5*  --   HGB 11.8* 13.9  HCT 35.2* 41.0  MCV 90.0  --   PLT 212  --    Cardiac Enzymes: No results for input(s): CKTOTAL, CKMB, CKMBINDEX, TROPONINI in the last 168 hours.  BNP (last 3 results) No results for input(s):  PROBNP in the last 8760 hours. CBG:  Recent Labs Lab 05/04/14 1753  GLUCAP 111*    Radiological Exams on Admission: Dg Chest 2 View  05/04/2014   CLINICAL DATA:  Gangrene of the toes on the left foot.  EXAM: CHEST  2 VIEW  COMPARISON:  03/12/2014  FINDINGS: There is chronic cardiomegaly. Pulmonary vascularity is normal. There is a persistent moderate right pleural effusion, slightly diminished since the prior exam. Left lung is clear.  No acute osseous abnormality.  IMPRESSION: Slight decrease in moderate right pleural effusion.   Electronically Signed   By: Rozetta Nunnery M.D.   On: 05/04/2014 18:08   Dg Foot Complete Left  05/04/2014   CLINICAL DATA:  Subsequent encounter for diabetes with gangrene on left toes.  EXAM: LEFT FOOT -  COMPLETE 3+ VIEW  COMPARISON:  04/12/2014.  FINDINGS: No evidence of fracture. No subluxation or dislocation. There is some lucency over the second toe in tip of the great toe suggesting soft tissue disease. No underlying osteomyelitis or destruction of bony anatomy is evident.  IMPRESSION: Stable. Soft tissue defects in the first and second toes without evidence for underlying bony destruction.   Electronically Signed   By: Misty Stanley M.D.   On: 05/04/2014 17:51    EKG: Independently reviewed. Normal sinus rhythm with LBBB.  Assessment/Plan Principal Problem:   Gangrene of foot Active Problems:   HYPERTENSION, BENIGN   ESRD (end stage renal disease) on dialysis   CAD (coronary artery disease)   Normocytic anemia   1. Left foot gangrene - appreciate vascular surgery consult by Dr. Kellie Simmering. Dr. Kellie Simmering is planning amputation of the left leg on Friday, day after tomorrow. Patient will be transferred to [facility] for further care. Patient is in agreement with transfer. Continue with empiric antibiotics. Continue pain medications. 2. ESRD on hemodialysis on Monday Wednesday and Friday - on-call nephrologist was consulted by the ER physician and was advised to  give Kayexalate for the patient's mild hyperkalemia. Recheck metabolic panel in a.m. and plan is to get dialyzed tomorrow morning. Please notify nephrologist again in a.m. 3. CAD status post stenting - denies any chest pain. 4. Hyperlipidemia - on statins. 5. Hypertension - continue present medications. 6. Diabetes mellitus type 2 presently on no medications - check hemoglobin A1c. Patient has been placed on sliding scale coverage. 7. Chronic anemia - follow CBC.  Patient will be transferred to [facility]. Accepting physician is Dr. Alcario Drought.  Code Status: Full code.  Family Communication: Patient's family at the bedside.  Disposition Plan: Admit to inpatient.    Amandamarie Feggins N. [facility] Pager [phone].  If 7PM-7AM, please contact night-coverage [email or web address] Password [facility] 05/04/2014, 8:42 PM

## 2014-05-04 NOTE — Progress Notes (Signed)
VASCULAR LAB PRELIMINARY  ARTERIAL  ABI completed: ABIs are non-compressible most likely due to calcified vessels.     RIGHT    LEFT    PRESSURE WAVEFORM  PRESSURE WAVEFORM  BRACHIAL N/A- AV fistula  BRACHIAL 136 Tri  DP   DP    AT Non com Biphasic AT Non com Biphasic  PT 128 Monophasic PT Non com Monophasic  PER   PER    GREAT TOE  NA GREAT TOE  NA    RIGHT LEFT  ABI Non compressible Non compressible    Landry Mellow, RDMS, RVT   05/04/2014, 6:32 PM

## 2014-05-04 NOTE — Consult Note (Signed)
Vascular Surgery Consultation  Reason for Consult: Gangrene left foot  HPI: [name] is a 58 y.o. male who presents for evaluation of gangrene left foot. Patient reported to the emergency department. He has had progressive gangrene of the left first and second toes in the proximal foot over the last several weeks. He went to the wound center today and but was seen by Dr. Judene Companion who referred him to the emergency department. He is a patient on hemodialysis Monday Wednesday and Friday but did not have dialysis today because of his wound center appointment. He normally dialyzes in [place]. He denies any chills and fever. He does have a history of coronary artery disease but denies any recent chest pain. He has previously had stents placed in coronary arteries in the past.   Past Medical History  Diagnosis Date  . End stage renal disease   . HTN (hypertension)   . HLD (hyperlipidemia)   . Anemia   . Hyperparathyroidism   . Diabetes mellitus without complication    Past Surgical History  Procedure Laterality Date  . Cardiac catheterization      stent placement    History   Social History  . Marital Status: Widowed    Spouse Name: N/A    Number of Children: N/A  . Years of Education: N/A   Social History Main Topics  . Smoking status: Current Every Day Smoker  . Smokeless tobacco: None     Comment: 5 cigs per day   . Alcohol Use: No  . Drug Use: No     Comment: has used crack cocaine in past   . Sexual Activity: None   Other Topics Concern  . None   Social History Narrative   Engaged.    Family History  Problem Relation Age of Onset  . Heart failure Other   . Hypertension Other   . Leukemia Other   . Diabetes Other    No Known Allergies Prior to Admission medications   Medication Sig Start Date End Date Taking? Authorizing Provider  acetaminophen (TYLENOL) 500 MG tablet Take 1,000 mg by mouth every 6 (six) hours as needed for moderate pain.   Yes  Historical Provider, MD  albuterol (PROVENTIL HFA;VENTOLIN HFA) 108 (90 BASE) MCG/ACT inhaler Inhale 1 puff into the lungs every 6 (six) hours as needed for wheezing or shortness of breath.   Yes Historical Provider, MD  aspirin 325 MG tablet Take 325 mg by mouth daily.    Yes Historical Provider, MD  budesonide-formoterol (SYMBICORT) 160-4.5 MCG/ACT inhaler Inhale 2 puffs into the lungs 2 (two) times daily.   Yes Historical Provider, MD  calcium acetate, Phos Binder, (PHOSLYRA) 667 MG/5ML SOLN Take 667 mg by mouth 3 (three) times daily with meals.   Yes Historical Provider, MD  clindamycin (CLEOCIN) 300 MG capsule Take 300 mg by mouth every 6 (six) hours.   Yes Historical Provider, MD  furosemide (LASIX) 80 MG tablet Take 80 mg by mouth 2 (two) times daily.   Yes Historical Provider, MD  metoprolol tartrate (LOPRESSOR) 25 MG tablet Take 25 mg by mouth 2 (two) times daily.     Yes Historical Provider, MD  sevelamer carbonate (RENVELA) 800 MG tablet Take 800 mg by mouth 3 (three) times daily with meals.   Yes Historical Provider, MD  simvastatin (ZOCOR) 20 MG tablet Take 20 mg by mouth at bedtime.    Yes Historical Provider, MD  sodium bicarbonate 650 MG tablet Take 650 mg by  mouth daily.   Yes Historical Provider, MD  tiotropium (SPIRIVA) 18 MCG inhalation capsule Place 18 mcg into inhaler and inhale daily.   Yes Historical Provider, MD  cephALEXin (KEFLEX) 500 MG capsule Take 1 capsule (500 mg total) by mouth 4 (four) times daily. Patient not taking: Reported on 05/04/2014 04/05/14   Alvina Chou, PA-C  HYDROcodone-acetaminophen (NORCO/VICODIN) 5-325 MG per tablet Take 2 tablets by mouth every 4 (four) hours as needed for moderate pain or severe pain. Patient not taking: Reported on 05/04/2014 04/05/14   Alvina Chou, PA-C  levofloxacin (LEVAQUIN) 500 MG tablet Take 500 mg by mouth daily.    Historical Provider, MD  sevelamer (RENAGEL) 800 MG tablet Take 800 mg by mouth 3 (three) times  daily. Take 2 tabs    Historical Provider, MD     Positive ROS: Unable to ambulate because of pain in left foot. Has hemodialysis Monday Wednesday Friday through access in right upper extremity. Patient also known to have gallstones and was scheduled for cholecystectomy in the near future at [facility]  All other systems have been reviewed and were otherwise negative with the exception of those mentioned in the HPI and as above.  Physical Exam: Filed Vitals:   05/04/14 1930  BP: 139/89  Pulse: 67  Temp:   Resp: 20    General: Alert, no acute distress HEENT: Normal for age Cardiovascular: Regular rate and rhythm. Carotid pulses 2+, no bruits audible Respiratory: Clear to auscultation. No cyanosis, no use of accessory musculature GI: No organomegaly, abdomen is soft and non-tender Skin: No lesions in the area of chief complaint Neurologic: Sensation intact distally Psychiatric: Patient is competent for consent with normal mood and affect Musculoskeletal: No obvious deformities Extremities: Right leg with 3+ femoral pulse but no distal pulses palpable. No evidence of ischemia or cellulitis. Left leg with gangrene involving the entire left foot on the dorsum with progressive gangrene of the first and second toes. He has 3+ femoral pulse but no distal pul   Assessme patient needs left below-knee amputation versus above-knee amputation. He needs to be transferred to [facility] and evaluated by the renal service. He will need hemodialysis tomorrow-Thursday. nt/Plan: We will see again tomorrow and plan amputation of left leg on Friday. Will need IV antibiotics and pain medication prior to that time and hopefully can perform left leg amputation midday on Friday   Tinnie Gens, MD 05/04/2014 8:03 PM

## 2014-05-04 NOTE — ED Notes (Signed)
Dr. Kellie Simmering at bedside.

## 2014-05-04 NOTE — Progress Notes (Signed)
ANTIBIOTIC CONSULT NOTE - INITIAL  Pharmacy Consult for Vancomycin and Zosyn  Indication: Cellulitis  No Known Allergies  Patient Measurements: Actual Body Weight: 88 kg  Vital Signs: Temp: 98.5 F (36.9 C) (12/09 1521) Temp Source: Oral (12/09 1521) BP: 143/82 mmHg (12/09 2130) Pulse Rate: 67 (12/09 2100) Intake/Output from previous day:   Intake/Output from this shift:    Labs:  Recent Labs  05/04/14 1727 05/04/14 1737  WBC 12.2*  --   HGB 11.8* 13.9  PLT 212  --   CREATININE  --  10.80*   CrCl cannot be calculated (Unknown ideal weight.). No results for input(s): VANCOTROUGH, VANCOPEAK, VANCORANDOM, GENTTROUGH, GENTPEAK, GENTRANDOM, TOBRATROUGH, TOBRAPEAK, TOBRARND, AMIKACINPEAK, AMIKACINTROU, AMIKACIN in the last 72 hours.   Microbiology: No results found for this or any previous visit (from the past 720 hour(s)).  Medical History: Past Medical History  Diagnosis Date  . End stage renal disease   . HTN (hypertension)   . HLD (hyperlipidemia)   . Anemia   . Hyperparathyroidism   . Diabetes mellitus without complication     Medications:  Prescriptions prior to admission  Medication Sig Dispense Refill Last Dose  . acetaminophen (TYLENOL) 500 MG tablet Take 1,000 mg by mouth every 6 (six) hours as needed for moderate pain.   unknown  . albuterol (PROVENTIL HFA;VENTOLIN HFA) 108 (90 BASE) MCG/ACT inhaler Inhale 1 puff into the lungs every 6 (six) hours as needed for wheezing or shortness of breath.   05/04/2014 at Unknown time  . aspirin 325 MG tablet Take 325 mg by mouth daily.    05/04/2014 at Unknown time  . budesonide-formoterol (SYMBICORT) 160-4.5 MCG/ACT inhaler Inhale 2 puffs into the lungs 2 (two) times daily.   05/04/2014 at Unknown time  . calcium acetate, Phos Binder, (PHOSLYRA) 667 MG/5ML SOLN Take 667 mg by mouth 3 (three) times daily with meals.   05/04/2014 at Unknown time  . clindamycin (CLEOCIN) 300 MG capsule Take 300 mg by mouth every 6 (six)  hours.   05/04/2014 at Unknown time  . furosemide (LASIX) 80 MG tablet Take 80 mg by mouth 2 (two) times daily.   05/04/2014 at Unknown time  . metoprolol tartrate (LOPRESSOR) 25 MG tablet Take 25 mg by mouth 2 (two) times daily.     05/04/2014 at 1500  . sevelamer carbonate (RENVELA) 800 MG tablet Take 800 mg by mouth 3 (three) times daily with meals.   05/04/2014 at Unknown time  . simvastatin (ZOCOR) 20 MG tablet Take 20 mg by mouth at bedtime.    05/03/2014 at Unknown time  . sodium bicarbonate 650 MG tablet Take 650 mg by mouth daily.   Past Week at Unknown time  . tiotropium (SPIRIVA) 18 MCG inhalation capsule Place 18 mcg into inhaler and inhale daily.   05/04/2014 at Unknown time  . cephALEXin (KEFLEX) 500 MG capsule Take 1 capsule (500 mg total) by mouth 4 (four) times daily. (Patient not taking: Reported on 05/04/2014) 40 capsule 0   . HYDROcodone-acetaminophen (NORCO/VICODIN) 5-325 MG per tablet Take 2 tablets by mouth every 4 (four) hours as needed for moderate pain or severe pain. (Patient not taking: Reported on 05/04/2014) 20 tablet 0   . levofloxacin (LEVAQUIN) 500 MG tablet Take 500 mg by mouth daily.     . sevelamer (RENAGEL) 800 MG tablet Take 800 mg by mouth 3 (three) times daily. Take 2 tabs   03/23/2014 at Unknown time   Scheduled:  . [START ON 05/05/2014] aspirin  325 mg Oral Daily  . budesonide-formoterol  2 puff Inhalation BID  . [START ON 05/05/2014] calcium acetate (Phos Binder)  667 mg Oral TID WC  . [START ON 05/05/2014] furosemide  80 mg Oral BID  . heparin  5,000 Units Subcutaneous 3 times per day  . [START ON 05/05/2014] insulin aspart  0-9 Units Subcutaneous TID WC  . metoprolol tartrate  25 mg Oral BID  . [START ON 05/05/2014] sevelamer carbonate  800 mg Oral TID WC  . simvastatin  20 mg Oral QHS  . [START ON 05/05/2014] sodium bicarbonate  650 mg Oral Daily  . sodium chloride  3 mL Intravenous Q12H  . sodium polystyrene  30 g Oral Once  . [START ON 05/05/2014]  tiotropium  18 mcg Inhalation Daily   Assessment: 58yo male with history of ESRD on HD MWF presents for evaluation of grangrene left foot. Pharmacy is consulted to dose cellulitis. Per vascular surgery note, patient will go to HD Thursday and plan to amputate left leg BKA vs AKA on Friday.   Goal of Therapy:  Vancomycin target pre-HD level 15-25 mcg/mL  Plan:  Zosyn 2.25g IV q8h Vancomycin 2g IV load followed by 1g post-HD Measure antibiotic drug levels at steady state Follow up culture results Follow up with HD schedule for redosing vancomycin  Andrey Cota. Diona Foley, PharmD Clinical Pharmacist Pager [phone] 05/04/2014,10:26 PM

## 2014-05-04 NOTE — ED Notes (Signed)
PT STATES SINCE HE IS GETTING AN IV HE DID NOT WANT ME TO STICK HIM FOR BLOOD WORK THAT HE WANTED TO WAIT AND ONLY BE STUCK ONE TIME

## 2014-05-04 NOTE — ED Provider Notes (Signed)
CSN: [id number]     Arrival date & time 05/04/14  1508 History   First MD Initiated Contact with Patient 05/04/14 1630     Chief Complaint  Patient presents with  . Foot Problem    left     (Consider location/radiation/quality/duration/timing/severity/associated sxs/prior Treatment) HPI   58 year old male with hx of non insulin dependent diabetes, ESRD on dialysis, HTN, HLD presents for evaluation of L foot infection.  Patient reports he has had infection to his left foot ongoing for the past 2-3 months. He complains of persistent sharp achy pain throughout the foot most significant to his toes. Pain is keeping him up at night and worsening with movement and ambulation. He noticed dark skin changes, strong odor, and pus drainage from the ulceration. He denies any significant fever, chills, ankle pain, knee pain or any other complaint. He has been seen both at [facility] ER and at [facility] ER for this complaint but states he usually was discharge with just antibiotics and pain medication that has not helped. He was scheduled to be seen by the wound care center for the first time today. Patient was sent here from the wound care center for further evaluation of his foot infection. Patient was sent by Dr. Judene Companion with a request for a vascular consultation. Patient also mentioned that he has never been diagnosed with diabetes until today. He is a dialysis patient.  Past Medical History  Diagnosis Date  . End stage renal disease   . HTN (hypertension)   . HLD (hyperlipidemia)   . Anemia   . Hyperparathyroidism   . Diabetes mellitus without complication    Past Surgical History  Procedure Laterality Date  . Cardiac catheterization      stent placement    Family History  Problem Relation Age of Onset  . Heart failure Other   . Hypertension Other   . Leukemia Other   . Diabetes Other    History  Substance Use Topics  . Smoking status: Current Every Day Smoker  . Smokeless  tobacco: Not on file     Comment: 5 cigs per day   . Alcohol Use: No    Review of Systems  All other systems reviewed and are negative.     Allergies  Review of patient's allergies indicates no known allergies.  Home Medications   Prior to Admission medications   Medication Sig Start Date End Date Taking? Authorizing Provider  albuterol (PROVENTIL HFA;VENTOLIN HFA) 108 (90 BASE) MCG/ACT inhaler Inhale 1 puff into the lungs every 6 (six) hours as needed for wheezing or shortness of breath.    Historical Provider, MD  aspirin 325 MG tablet Take 325 mg by mouth daily.     Historical Provider, MD  calcium acetate, Phos Binder, (PHOSLYRA) 667 MG/5ML SOLN Take 667 mg by mouth 3 (three) times daily with meals.    Historical Provider, MD  cephALEXin (KEFLEX) 500 MG capsule Take 1 capsule (500 mg total) by mouth 4 (four) times daily. 04/05/14   Kaitlyn Szekalski, PA-C  furosemide (LASIX) 80 MG tablet Take 80 mg by mouth 2 (two) times daily.    Historical Provider, MD  HYDROcodone-acetaminophen (NORCO/VICODIN) 5-325 MG per tablet Take 2 tablets by mouth every 4 (four) hours as needed for moderate pain or severe pain. 04/05/14   Kaitlyn Szekalski, PA-C  metoprolol tartrate (LOPRESSOR) 25 MG tablet Take 25 mg by mouth 2 (two) times daily.      Historical Provider, MD  nitroGLYCERIN (NITROSTAT) 0.4 MG SL tablet Place 0.4 mg under the tongue every 5 (five) minutes as needed.      Historical Provider, MD  sevelamer (RENAGEL) 800 MG tablet Take 800 mg by mouth 3 (three) times daily. Take 2 tabs    Historical Provider, MD  simvastatin (ZOCOR) 20 MG tablet Take 20 mg by mouth daily.      Historical Provider, MD  sodium bicarbonate 650 MG tablet Take 650 mg by mouth daily.    Historical Provider, MD  tiotropium (SPIRIVA) 18 MCG inhalation capsule Place 18 mcg into inhaler and inhale daily.    Historical Provider, MD   BP 137/86 mmHg  Pulse 70  Temp(Src) 98.5 F (36.9 C) (Oral)  Resp 20  SpO2  99% Physical Exam  Constitutional: He appears well-developed and well-nourished. No distress.  HENT:  Head: Atraumatic.  Eyes: Conjunctivae are normal.  Neck: Normal range of motion. Neck supple.  Musculoskeletal: He exhibits tenderness (L foot: significant necrosis skin changed noted to 1st-2nd toe with ulceration noted to webspace of the toe and surrounding erythema.  surrounding edema, exquisite tenderness to palpation.  Unable to palpate dorsalis pedis pulse.).  Neurological: He is alert.  Skin: No rash noted.  Right AV fistula with palpable thrills and bruits.  Psychiatric: He has a normal mood and affect.    ED Course  Procedures (including critical care time)  5:02 PM Patient with a nonhealing wound to his left foot concerning for vascular insufficiency leading to necrosis of toes. He mentioned that there was pustular discharge however I do not appreciate any signs of pustular drainage on my examination. Unable to palpate dorsalis pedis pulse and the first and second toes of the left foot is necrotic. Patient will benefit from a vascular consultation and admission for further management. Anticipate possible foot amputation.  6:39 PM ABI was completed however ABIs are noncompressible likely due to calcified vessels.  CBG unremarkable.  Xray of L foot without evidence of osteo.  Evidence of chronic renal disease with BUN 66, Cr 10.80.  K+ is 5.5, will check ECG to assess for changes.  I will also consult vascular surgeon for further management.    Non healing wound to L foot.   ARTERIAL  ABI completed: ABIs are non-compressible most likely due to calcified vessels.     RIGHT    LEFT    PRESSURE WAVEFORM  PRESSURE WAVEFORM  BRACHIAL N/A- AV fistula  BRACHIAL 136 Tri  DP   DP    AT Non com Biphasic AT Non com Biphasic  PT 128 Monophasic PT Non com Monophasic  PER   PER    GREAT TOE  NA GREAT TOE  NA    RIGHT LEFT  ABI  Non compressible Non compressible    Landry Mellow, RDMS, RVT  6:59 PM I have consulted with vascular surgeon Dr. Kellie Simmering who agrees to see pt in ER and will determine disposition.    8:01 PM Dr. Kellie Simmering has evauated pt and felt that the foot is not salvageable and will likely need BKA or possibly AKA.  He request for pt to be admitted at [facility], and to have pt dialyzed tomorrow with plan to perform surgery on Friday.  I will consult our nephrologist as well as [facility] for further care.    8:08 PM I have consulted with [facility], Dr. Hal Hope who agrees to see pt in ER and will transfer pt to [facility] under the care of Dr.  Alcario Drought, to telemetry bed.  I will consult with nephrologist to get pt dialyzed tomorrow.    8:24 PM I have consulted with nephrologist Dr. Lorrene Reid who recommend giving pt Kayexalate 30g and pt will be dialyzed tomorrow.    Labs Review Labs Reviewed  CBC WITH DIFFERENTIAL - Abnormal; Notable for the following:    WBC 12.2 (*)    RBC 3.91 (*)    Hemoglobin 11.8 (*)    HCT 35.2 (*)    RDW 17.2 (*)    Neutrophils Relative % 78 (*)    Neutro Abs 9.5 (*)    Monocytes Absolute 1.1 (*)    All other components within normal limits  CBG MONITORING, ED - Abnormal; Notable for the following:    Glucose-Capillary 111 (*)    All other components within normal limits  I-STAT CHEM 8, ED - Abnormal; Notable for the following:    Sodium 135 (*)    Potassium 5.5 (*)    BUN 66 (*)    Creatinine, Ser 10.80 (*)    Glucose, Bld 114 (*)    Calcium, Ion 1.12 (*)    All other components within normal limits    Imaging Review Dg Chest 2 View  05/04/2014   CLINICAL DATA:  Gangrene of the toes on the left foot.  EXAM: CHEST  2 VIEW  COMPARISON:  03/12/2014  FINDINGS: There is chronic cardiomegaly. Pulmonary vascularity is normal. There is a persistent moderate right pleural effusion, slightly diminished since the prior exam. Left lung is clear.  No acute  osseous abnormality.  IMPRESSION: Slight decrease in moderate right pleural effusion.   Electronically Signed   By: Rozetta Nunnery M.D.   On: 05/04/2014 18:08   Dg Foot Complete Left  05/04/2014   CLINICAL DATA:  Subsequent encounter for diabetes with gangrene on left toes.  EXAM: LEFT FOOT - COMPLETE 3+ VIEW  COMPARISON:  04/12/2014.  FINDINGS: No evidence of fracture. No subluxation or dislocation. There is some lucency over the second toe in tip of the great toe suggesting soft tissue disease. No underlying osteomyelitis or destruction of bony anatomy is evident.  IMPRESSION: Stable. Soft tissue defects in the first and second toes without evidence for underlying bony destruction.   Electronically Signed   By: Misty Stanley M.D.   On: 05/04/2014 17:51     EKG Interpretation None      Date: 05/04/2014  Rate: 66  Rhythm: normal sinus rhythm  QRS Axis: left  Intervals: normal  ST/T Wave abnormalities: nonspecific ST/T changes  Conduction Disutrbances:left bundle branch block  Narrative Interpretation:   Old EKG Reviewed: unchanged    MDM   Final diagnoses:  Necrosis of bone of foot  Dialysis patient    BP 139/89 mmHg  Pulse 67  Temp(Src) 98.5 F (36.9 C) (Oral)  Resp 20  SpO2 95%  I have reviewed nursing notes and vital signs. I personally reviewed the imaging tests through PACS system  I reviewed available ER/hospitalization records thought the EMR     Domenic Moras, PA-C 05/04/14 2028  Charlesetta Shanks, MD 05/05/14 1740

## 2014-05-05 LAB — COMPREHENSIVE METABOLIC PANEL
ALK PHOS: 182 U/L — AB (ref 39–117)
ALT: 23 U/L (ref 0–53)
ANION GAP: 18 — AB (ref 5–15)
AST: 39 U/L — ABNORMAL HIGH (ref 0–37)
Albumin: 2.4 g/dL — ABNORMAL LOW (ref 3.5–5.2)
BUN: 80 mg/dL — ABNORMAL HIGH (ref 6–23)
CO2: 21 mEq/L (ref 19–32)
CREATININE: 11.28 mg/dL — AB (ref 0.50–1.35)
Calcium: 9.4 mg/dL (ref 8.4–10.5)
Chloride: 95 mEq/L — ABNORMAL LOW (ref 96–112)
GFR calc non Af Amer: 4 mL/min — ABNORMAL LOW (ref >90)
GFR, EST AFRICAN AMERICAN: 5 mL/min — AB (ref >90)
GLUCOSE: 93 mg/dL (ref 70–99)
Potassium: 5.9 mEq/L — ABNORMAL HIGH (ref 3.7–5.3)
Sodium: 134 mEq/L — ABNORMAL LOW (ref 137–147)
Total Bilirubin: 1 mg/dL (ref 0.3–1.2)
Total Protein: 7.6 g/dL (ref 6.0–8.3)

## 2014-05-05 LAB — CBC
HEMATOCRIT: 33 % — AB (ref 39.0–52.0)
Hemoglobin: 11.2 g/dL — ABNORMAL LOW (ref 13.0–17.0)
MCH: 29.2 pg (ref 26.0–34.0)
MCHC: 33.9 g/dL (ref 30.0–36.0)
MCV: 85.9 fL (ref 78.0–100.0)
PLATELETS: 202 10*3/uL (ref 150–400)
RBC: 3.84 MIL/uL — ABNORMAL LOW (ref 4.22–5.81)
RDW: 16.7 % — AB (ref 11.5–15.5)
WBC: 10.5 10*3/uL (ref 4.0–10.5)

## 2014-05-05 LAB — CBC WITH DIFFERENTIAL/PLATELET
BASOS PCT: 1 % (ref 0–1)
Basophils Absolute: 0.1 10*3/uL (ref 0.0–0.1)
Eosinophils Absolute: 0.3 10*3/uL (ref 0.0–0.7)
Eosinophils Relative: 3 % (ref 0–5)
HCT: 33.5 % — ABNORMAL LOW (ref 39.0–52.0)
Hemoglobin: 11.3 g/dL — ABNORMAL LOW (ref 13.0–17.0)
Lymphocytes Relative: 15 % (ref 12–46)
Lymphs Abs: 1.5 10*3/uL (ref 0.7–4.0)
MCH: 29.1 pg (ref 26.0–34.0)
MCHC: 33.7 g/dL (ref 30.0–36.0)
MCV: 86.3 fL (ref 78.0–100.0)
MONOS PCT: 11 % (ref 3–12)
Monocytes Absolute: 1.1 10*3/uL — ABNORMAL HIGH (ref 0.1–1.0)
NEUTROS PCT: 72 % (ref 43–77)
Neutro Abs: 7.3 10*3/uL (ref 1.7–7.7)
Platelets: 206 10*3/uL (ref 150–400)
RBC: 3.88 MIL/uL — AB (ref 4.22–5.81)
RDW: 16.7 % — ABNORMAL HIGH (ref 11.5–15.5)
WBC: 10.2 10*3/uL (ref 4.0–10.5)

## 2014-05-05 LAB — GLUCOSE, CAPILLARY
GLUCOSE-CAPILLARY: 107 mg/dL — AB (ref 70–99)
GLUCOSE-CAPILLARY: 94 mg/dL (ref 70–99)
Glucose-Capillary: 104 mg/dL — ABNORMAL HIGH (ref 70–99)
Glucose-Capillary: 120 mg/dL — ABNORMAL HIGH (ref 70–99)

## 2014-05-05 LAB — CREATININE, SERUM
CREATININE: 10.91 mg/dL — AB (ref 0.50–1.35)
GFR, EST AFRICAN AMERICAN: 5 mL/min — AB (ref >90)
GFR, EST NON AFRICAN AMERICAN: 4 mL/min — AB (ref >90)

## 2014-05-05 LAB — MRSA PCR SCREENING: MRSA by PCR: NEGATIVE

## 2014-05-05 LAB — HEMOGLOBIN A1C
Hgb A1c MFr Bld: 6.2 % — ABNORMAL HIGH (ref <5.7)
MEAN PLASMA GLUCOSE: 131 mg/dL — AB (ref <117)

## 2014-05-05 MED ORDER — MORPHINE SULFATE 2 MG/ML IJ SOLN
INTRAMUSCULAR | Status: AC
  Filled 2014-05-05: qty 1

## 2014-05-05 MED ORDER — CALCIUM ACETATE 667 MG PO CAPS
667.0000 mg | ORAL_CAPSULE | Freq: Three times a day (TID) | ORAL | Status: DC
  Administered 2014-05-06 – 2014-05-10 (×13): 667 mg via ORAL
  Filled 2014-05-05 (×18): qty 1

## 2014-05-05 MED ORDER — SEVELAMER CARBONATE 800 MG PO TABS
3200.0000 mg | ORAL_TABLET | Freq: Three times a day (TID) | ORAL | Status: DC
  Administered 2014-05-05: 1600 mg via ORAL
  Administered 2014-05-06 – 2014-05-10 (×13): 3200 mg via ORAL
  Filled 2014-05-05 (×18): qty 4

## 2014-05-05 NOTE — Progress Notes (Signed)
[facility] PROGRESS NOTE  [name] [id number] DOB: [birth year] DOA: 05/04/2014 PCP: Donnie Coffin, MD  Assessment/Plan: 1. L foot gangrene 1. Vascular surgery following 2. On empiric vancomycin and zosyn 3. Plans for below knee amputation on 12/11 per Vascular Surgery 4. Medical clearance: EKG reviewed, no acute changes. Recent CXR reviewed with decreasing pleural effusion. Pt remains on minimal O2 support. No wheezing or crackles. Denies chest pain or sob. At this point in time, pt is medically clear for pending surgery. 2. Hx asthma 1. Lungs currently clear without wheezing 2. Pt reports improvement with proair hfa at home 3. Pt is currently continued with PRN albuterol nebs - will continue 3. ESRD on MWF HD 1. Nephrology following 2. Getting HD today 4. CAD s/p stent 1. Stable 5. HLD 1. On statin 6. HTN 1. BP stable 2. Cont current regimen, cont to monitor 7. DM2 1. Glucose stable and controlled 8. Chronic anemia 1. Stable 2. Cont to monitor 9. DVT prophylaxis 1. Heparin subq  Code Status: Full Family Communication: Pt in room Disposition Plan: Pending  Consultants:  Nephrology  Vascular Surgery  Antibiotics:  Vancomycin 12/10>>>  Zosyn 12/10>>>  HPI/Subjective: No acute events noted overnight  Objective: Filed Vitals:   05/05/14 0727 05/05/14 1000 05/05/14 1237 05/05/14 1300  BP:  130/75 121/68 137/81  Pulse:  57 63 55  Temp:  98 F (36.7 C) 97.8 F (36.6 C)   TempSrc:  Oral Oral   Resp:   20 15  Height:      Weight:   87 kg (191 lb 12.8 oz)   SpO2: 97% 98% 97%     Intake/Output Summary (Last 24 hours) at 05/05/14 1305 Last data filed at 05/05/14 0900  Gross per 24 hour  Intake    603 ml  Output      0 ml  Net    603 ml   Filed Weights   05/04/14 2234 05/05/14 0500 05/05/14 1237  Weight: 84.6 kg (186 lb 8.2 oz) 85.9 kg (189 lb 6 oz) 87 kg (191 lb 12.8 oz)    Exam:   General:  Awake, in nad  Cardiovascular:  regular, s1, s2  Respiratory: normal resp effort, no wheezing  Abdomen: soft,nondistended  Musculoskeletal: LLE with dressings in place, dry, intact   Data Reviewed: Basic Metabolic Panel:  Recent Labs Lab 05/04/14 1737 05/04/14 2332 05/05/14 0348  NA 135*  --  134*  K 5.5*  --  5.9*  CL 102  --  95*  CO2  --   --  21  GLUCOSE 114*  --  93  BUN 66*  --  80*  CREATININE 10.80* 10.91* 11.28*  CALCIUM  --   --  9.4   Liver Function Tests:  Recent Labs Lab 05/05/14 0348  AST 39*  ALT 23  ALKPHOS 182*  BILITOT 1.0  PROT 7.6  ALBUMIN 2.4*   No results for input(s): LIPASE, AMYLASE in the last 168 hours. No results for input(s): AMMONIA in the last 168 hours. CBC:  Recent Labs Lab 05/04/14 1727 05/04/14 1737 05/04/14 2332 05/05/14 0348  WBC 12.2*  --  10.5 10.2  NEUTROABS 9.5*  --   --  7.3  HGB 11.8* 13.9 11.2* 11.3*  HCT 35.2* 41.0 33.0* 33.5*  MCV 90.0  --  85.9 86.3  PLT 212  --  202 206   Cardiac Enzymes: No results for input(s): CKTOTAL, CKMB, CKMBINDEX, TROPONINI in the last 168 hours. BNP (last 3  results) No results for input(s): PROBNP in the last 8760 hours. CBG:  Recent Labs Lab 05/04/14 1753 05/04/14 2141 05/04/14 2225 05/05/14 0741 05/05/14 1140  GLUCAP 111* 64* 117* 104* 107*    Recent Results (from the past 240 hour(s))  MRSA PCR Screening     Status: None   Collection Time: 05/05/14  1:05 AM  Result Value Ref Range Status   MRSA by PCR NEGATIVE NEGATIVE Final    Comment:        The GeneXpert MRSA Assay (FDA approved for NASAL specimens only), is one component of a comprehensive MRSA colonization surveillance program. It is not intended to diagnose MRSA infection nor to guide or monitor treatment for MRSA infections.      Studies: Dg Chest 2 View  05/04/2014   CLINICAL DATA:  Gangrene of the toes on the left foot.  EXAM: CHEST  2 VIEW  COMPARISON:  03/12/2014  FINDINGS: There is chronic cardiomegaly. Pulmonary  vascularity is normal. There is a persistent moderate right pleural effusion, slightly diminished since the prior exam. Left lung is clear.  No acute osseous abnormality.  IMPRESSION: Slight decrease in moderate right pleural effusion.   Electronically Signed   By: Rozetta Nunnery M.D.   On: 05/04/2014 18:08   Dg Foot Complete Left  05/04/2014   CLINICAL DATA:  Subsequent encounter for diabetes with gangrene on left toes.  EXAM: LEFT FOOT - COMPLETE 3+ VIEW  COMPARISON:  04/12/2014.  FINDINGS: No evidence of fracture. No subluxation or dislocation. There is some lucency over the second toe in tip of the great toe suggesting soft tissue disease. No underlying osteomyelitis or destruction of bony anatomy is evident.  IMPRESSION: Stable. Soft tissue defects in the first and second toes without evidence for underlying bony destruction.   Electronically Signed   By: Misty Stanley M.D.   On: 05/04/2014 17:51    Scheduled Meds: . aspirin  325 mg Oral Daily  . budesonide-formoterol  2 puff Inhalation BID  . calcium acetate  667 mg Oral TID WC  . furosemide  80 mg Oral BID  . heparin  5,000 Units Subcutaneous 3 times per day  . insulin aspart  0-9 Units Subcutaneous TID WC  . metoprolol tartrate  25 mg Oral BID  . piperacillin-tazobactam (ZOSYN)  IV  2.25 g Intravenous Q8H  . sevelamer carbonate  3,200 mg Oral TID WC  . simvastatin  20 mg Oral QHS  . sodium bicarbonate  650 mg Oral Daily  . sodium chloride  3 mL Intravenous Q12H  . sodium polystyrene  30 g Oral Once  . tiotropium  18 mcg Inhalation Daily  . vancomycin  1,000 mg Intravenous Q Thu-HD  . [START ON 05/06/2014] vancomycin  1,000 mg Intravenous Q M,W,F-HD   Continuous Infusions:   Principal Problem:   Gangrene of foot Active Problems:   HYPERTENSION, BENIGN   ESRD (end stage renal disease) on dialysis   CAD (coronary artery disease)   Normocytic anemia  Time spent: 43min  Detron Carras, [facility] Pager [phone]. If  7PM-7AM, please contact night-coverage at [email or web address], password [facility] 05/05/2014, 1:05 PM  LOS: 1 day

## 2014-05-05 NOTE — Progress Notes (Signed)
New Admission Note:   Arrival: via [facility] Mental Orientation: A&Ox4 Telemetry: none ordered Assessment:  See doc flowsheet Skin: necrotic diabetic ulcer between left great toe and 2nd toe IV: left AC, patent Pain: 7/10 pain from left foot Safety Measures:  Call bell placed within reach; patient instructed on use of call bell and verbalized understanding. Bed in lowest position.  Yellow bracelet on.  Non-skid socks on.  Bed alarm on. [address] Orientation: Patient oriented to staff, room, and unit. Family: family at bedside  Orders have been reviewed and implemented. Will continue to monitor.  Arlyss Queen, RN, BSN

## 2014-05-05 NOTE — Procedures (Signed)
Patient seen on Hemodialysis. QB 400, UF goal 3.5L Treatment adjusted as needed.  Elmarie Shiley MD [facility]. Office # [phone] Pager # [phone] 12:56 PM

## 2014-05-05 NOTE — Plan of Care (Signed)
Problem: Phase I Progression Outcomes Goal: Pain controlled with appropriate interventions Outcome: Completed/Met Date Met:  05/05/14 Goal: Hemodynamically stable Outcome: Completed/Met Date Met:  05/05/14

## 2014-05-05 NOTE — Progress Notes (Signed)
Patient ID: [name], male   DOB: December 24, 1955, 58 y.o.   MRN: [id number] As per previous note in ER yesterday, patient is scheduled for left below-knee amputation tomorrow per Dr. Scot Dock  Patient has had request of renal consult and will need hemodialysis today in preparation for surgery tomorrow since his normal dialysis day was yesterday and he missed that because of an appointment with the wound center.  Will preop patient for left below-knee amputation in a.m.

## 2014-05-05 NOTE — Consult Note (Signed)
[name] 05/05/2014 Rexene Agent Requesting Physician:  Kellie Simmering, MD  Reason for Consult:  comanagement of ESRD HPI:  67M ESRD MWF [facility] (w/ [facility]) admitted 05/04/14 with progressive gangrenous L foot.  Plan is for L BKA vs AKA 05/06/14.    Pt with RUA AVF.  Denies any recent HD issues.  Tx records requested.    Filed Weights   05/04/14 2234 05/05/14 0500  Weight: 84.6 kg (186 lb 8.2 oz) 85.9 kg (189 lb 6 oz)    I/O last 3 completed shifts: In: 243 [P.O.:240; I.V.:3] Out: -   ROS Balance of 12 systems is negative w/ exceptions as above  Outpt HD Orders Unit: [facility] Days: MWF Time: 3h41min Dialyzer: [place] Polyflux EDW: 83.0kg K/Ca: 2 / 2.5 Access: AVG Needle Size: 14g BFR/DFR: 500 / 800 UF Proflie: profile 2 VDRA: Hectorol 2.5qTx EPO: none IV Fe: none Heparin: 2000 bolus, and 600/hr Most Recent Phos / PTH: unk Most Recent TSAT / Ferritin: unk Most Recent eKT/V: unk Treatment Adherence: unk  PMH  Past Medical History  Diagnosis Date  . End stage renal disease   . HTN (hypertension)   . HLD (hyperlipidemia)   . Anemia   . Hyperparathyroidism   . Diabetes mellitus without complication    PSH  Past Surgical History  Procedure Laterality Date  . Cardiac catheterization      stent placement    FH  Family History  Problem Relation Age of Onset  . Heart failure Other   . Hypertension Other   . Leukemia Other   . Diabetes Other    SH  reports that he has been smoking.  He does not have any smokeless tobacco history on file. He reports that he does not drink alcohol or use illicit drugs. Allergies No Known Allergies Home medications Prior to Admission medications   Medication Sig Start Date End Date Taking? Authorizing Provider  acetaminophen (TYLENOL) 500 MG tablet Take 1,000 mg by mouth every 6 (six) hours as needed for moderate pain.   Yes Historical Provider, MD  albuterol (PROVENTIL HFA;VENTOLIN HFA) 108 (90 BASE)  MCG/ACT inhaler Inhale 1 puff into the lungs every 6 (six) hours as needed for wheezing or shortness of breath.   Yes Historical Provider, MD  aspirin 325 MG tablet Take 325 mg by mouth daily.    Yes Historical Provider, MD  budesonide-formoterol (SYMBICORT) 160-4.5 MCG/ACT inhaler Inhale 2 puffs into the lungs 2 (two) times daily.   Yes Historical Provider, MD  calcium acetate, Phos Binder, (PHOSLYRA) 667 MG/5ML SOLN Take 667 mg by mouth 3 (three) times daily with meals.   Yes Historical Provider, MD  clindamycin (CLEOCIN) 300 MG capsule Take 300 mg by mouth every 6 (six) hours.   Yes Historical Provider, MD  furosemide (LASIX) 80 MG tablet Take 80 mg by mouth 2 (two) times daily.   Yes Historical Provider, MD  metoprolol tartrate (LOPRESSOR) 25 MG tablet Take 25 mg by mouth 2 (two) times daily.     Yes Historical Provider, MD  sevelamer carbonate (RENVELA) 800 MG tablet Take 800 mg by mouth 3 (three) times daily with meals.   Yes Historical Provider, MD  simvastatin (ZOCOR) 20 MG tablet Take 20 mg by mouth at bedtime.    Yes Historical Provider, MD  sodium bicarbonate 650 MG tablet Take 650 mg by mouth daily.   Yes Historical Provider, MD  tiotropium (SPIRIVA) 18 MCG inhalation capsule Place 18 mcg into inhaler and inhale  daily.   Yes Historical Provider, MD  cephALEXin (KEFLEX) 500 MG capsule Take 1 capsule (500 mg total) by mouth 4 (four) times daily. Patient not taking: Reported on 05/04/2014 04/05/14   Alvina Chou, PA-C  HYDROcodone-acetaminophen (NORCO/VICODIN) 5-325 MG per tablet Take 2 tablets by mouth every 4 (four) hours as needed for moderate pain or severe pain. Patient not taking: Reported on 05/04/2014 04/05/14   Alvina Chou, PA-C  levofloxacin (LEVAQUIN) 500 MG tablet Take 500 mg by mouth daily.    Historical Provider, MD  sevelamer (RENAGEL) 800 MG tablet Take 800 mg by mouth 3 (three) times daily. Take 2 tabs    Historical Provider, MD    Current Medications Scheduled  Meds: . aspirin  325 mg Oral Daily  . budesonide-formoterol  2 puff Inhalation BID  . calcium acetate (Phos Binder)  667 mg Oral TID WC  . furosemide  80 mg Oral BID  . heparin  5,000 Units Subcutaneous 3 times per day  . insulin aspart  0-9 Units Subcutaneous TID WC  . metoprolol tartrate  25 mg Oral BID  . piperacillin-tazobactam (ZOSYN)  IV  2.25 g Intravenous Q8H  . sevelamer carbonate  800 mg Oral TID WC  . simvastatin  20 mg Oral QHS  . sodium bicarbonate  650 mg Oral Daily  . sodium chloride  3 mL Intravenous Q12H  . sodium polystyrene  30 g Oral Once  . tiotropium  18 mcg Inhalation Daily  . vancomycin  1,000 mg Intravenous Q Thu-HD  . [START ON 05/06/2014] vancomycin  1,000 mg Intravenous Q M,W,F-HD   Continuous Infusions:  PRN Meds:.acetaminophen **OR** acetaminophen, albuterol, morphine injection, ondansetron **OR** ondansetron (ZOFRAN) IV  CBC  Recent Labs Lab 05/04/14 1727 05/04/14 1737 05/04/14 2332 05/05/14 0348  WBC 12.2*  --  10.5 10.2  NEUTROABS 9.5*  --   --  7.3  HGB 11.8* 13.9 11.2* 11.3*  HCT 35.2* 41.0 33.0* 33.5*  MCV 90.0  --  85.9 86.3  PLT 212  --  [id number]   Basic Metabolic Panel  Recent Labs Lab 05/04/14 1737 05/04/14 2332 05/05/14 0348  NA 135*  --  134*  K 5.5*  --  5.9*  CL 102  --  95*  CO2  --   --  21  GLUCOSE 114*  --  93  BUN 66*  --  80*  CREATININE 10.80* 10.91* 11.28*  CALCIUM  --   --  9.4    Physical Exam  Blood pressure 130/75, pulse 57, temperature 98 F (36.7 C), temperature source Oral, resp. rate 18, height 6' 2.5" (1.892 m), weight 85.9 kg (189 lb 6 oz), SpO2 98 %. GEN: NAD ENT: NCAT EYES: EOMI CV: RRR, no rub. Nl s1s2 PULM: CTAB ABD: s/nt/nd SKIN: LLE bandaged EXT:No LEE, RUA with 2 large aneurysms, avf just lateral to these   A/P 1. ESRD 1. MWF [facility], missed 12/9 2. HD today 12/10, then 12/12; back on MWF after that 2. Anemia 1. Hb at goal 2. Will review Tx meds when  obtained 3. HTN/Vol 1. Cont Lasix, MTP 2. EDW will need decrease post procedure 3. BP controlled currently 4. 2HPTH 1. Add on Phos 2. Cont sevelamer 3200 qAC + PhosLo 1qAC 5. Gangrenous L Foot 1. For  BKA vs AKA 12/11 w/ VVS 6. CAD 7. DM2  Pearson Grippe MD 05/05/2014, 10:20 AM

## 2014-05-06 ENCOUNTER — Encounter (HOSPITAL_COMMUNITY): Payer: Self-pay | Admitting: Anesthesiology

## 2014-05-06 ENCOUNTER — Encounter (HOSPITAL_COMMUNITY)
Admission: EM | Disposition: A | Discharge status: Skilled Nursing Facility | Payer: Self-pay | Source: Home / Self Care | Attending: Internal Medicine

## 2014-05-06 ENCOUNTER — Inpatient Hospital Stay (HOSPITAL_COMMUNITY): Payer: Medicare Other | Admitting: Anesthesiology

## 2014-05-06 DIAGNOSIS — I70262 Atherosclerosis of native arteries of extremities with gangrene, left leg: Secondary | ICD-10-CM

## 2014-05-06 HISTORY — PX: AMPUTATION: SHX166

## 2014-05-06 LAB — CBC
HCT: 30.7 % — ABNORMAL LOW (ref 39.0–52.0)
HEMATOCRIT: 33.3 % — AB (ref 39.0–52.0)
Hemoglobin: 11.1 g/dL — ABNORMAL LOW (ref 13.0–17.0)
Hemoglobin: 10.3 g/dL — ABNORMAL LOW (ref 13.0–17.0)
MCH: 29 pg (ref 26.0–34.0)
MCH: 29.2 pg (ref 26.0–34.0)
MCHC: 33.3 g/dL (ref 30.0–36.0)
MCHC: 33.6 g/dL (ref 30.0–36.0)
MCV: 86.9 fL (ref 78.0–100.0)
MCV: 87 fL (ref 78.0–100.0)
PLATELETS: 222 10*3/uL (ref 150–400)
Platelets: 203 10*3/uL (ref 150–400)
RBC: 3.83 MIL/uL — AB (ref 4.22–5.81)
RBC: 3.53 MIL/uL — AB (ref 4.22–5.81)
RDW: 16.9 % — ABNORMAL HIGH (ref 11.5–15.5)
RDW: 17 % — AB (ref 11.5–15.5)
WBC: 9 10*3/uL (ref 4.0–10.5)
WBC: 11.2 10*3/uL — AB (ref 4.0–10.5)

## 2014-05-06 LAB — GLUCOSE, CAPILLARY
GLUCOSE-CAPILLARY: 84 mg/dL (ref 70–99)
GLUCOSE-CAPILLARY: 131 mg/dL — AB (ref 70–99)
Glucose-Capillary: 98 mg/dL (ref 70–99)
Glucose-Capillary: 82 mg/dL (ref 70–99)
Glucose-Capillary: 102 mg/dL — ABNORMAL HIGH (ref 70–99)

## 2014-05-06 LAB — BASIC METABOLIC PANEL
ANION GAP: 16 — AB (ref 5–15)
BUN: 51 mg/dL — ABNORMAL HIGH (ref 6–23)
CHLORIDE: 95 meq/L — AB (ref 96–112)
CO2: 26 meq/L (ref 19–32)
Calcium: 9.3 mg/dL (ref 8.4–10.5)
Creatinine, Ser: 8.07 mg/dL — ABNORMAL HIGH (ref 0.50–1.35)
GFR calc non Af Amer: 6 mL/min — ABNORMAL LOW (ref >90)
GFR, EST AFRICAN AMERICAN: 7 mL/min — AB (ref >90)
Glucose, Bld: 85 mg/dL (ref 70–99)
Potassium: 5.4 mEq/L — ABNORMAL HIGH (ref 3.7–5.3)
SODIUM: 137 meq/L (ref 137–147)

## 2014-05-06 LAB — SURGICAL PCR SCREEN
MRSA, PCR: NEGATIVE
STAPHYLOCOCCUS AUREUS: NEGATIVE

## 2014-05-06 LAB — CREATININE, SERUM
Creatinine, Ser: 8.94 mg/dL — ABNORMAL HIGH (ref 0.50–1.35)
GFR calc non Af Amer: 6 mL/min — ABNORMAL LOW (ref >90)
GFR, EST AFRICAN AMERICAN: 7 mL/min — AB (ref >90)

## 2014-05-06 SURGERY — AMPUTATION BELOW KNEE
Anesthesia: General | Site: Leg Lower | Laterality: Left

## 2014-05-06 SURGERY — AMPUTATION BELOW KNEE
Anesthesia: General | Laterality: Left

## 2014-05-06 MED ORDER — DOCUSATE SODIUM 100 MG PO CAPS
100.0000 mg | ORAL_CAPSULE | Freq: Every day | ORAL | Status: DC
  Administered 2014-05-07 – 2014-05-10 (×4): 100 mg via ORAL
  Filled 2014-05-06 (×4): qty 1

## 2014-05-06 MED ORDER — VANCOMYCIN HCL IN DEXTROSE 1-5 GM/200ML-% IV SOLN
1000.0000 mg | INTRAVENOUS | Status: AC
  Administered 2014-05-07: 1000 mg via INTRAVENOUS
  Filled 2014-05-06 (×2): qty 200

## 2014-05-06 MED ORDER — MORPHINE SULFATE 2 MG/ML IJ SOLN
2.0000 mg | INTRAMUSCULAR | Status: DC | PRN
  Administered 2014-05-06 – 2014-05-07 (×2): 4 mg via INTRAVENOUS
  Administered 2014-05-07: 2 mg via INTRAVENOUS
  Administered 2014-05-07: 4 mg via INTRAVENOUS
  Administered 2014-05-07: 2 mg via INTRAVENOUS
  Administered 2014-05-08 – 2014-05-09 (×5): 4 mg via INTRAVENOUS
  Filled 2014-05-06: qty 1
  Filled 2014-05-06 (×8): qty 2

## 2014-05-06 MED ORDER — SODIUM CHLORIDE 0.9 % IV SOLN
INTRAVENOUS | Status: DC | PRN
  Administered 2014-05-06: 07:00:00 via INTRAVENOUS

## 2014-05-06 MED ORDER — MIDAZOLAM HCL 2 MG/2ML IJ SOLN
INTRAMUSCULAR | Status: AC
  Filled 2014-05-06: qty 2

## 2014-05-06 MED ORDER — ONDANSETRON HCL 4 MG/2ML IJ SOLN
INTRAMUSCULAR | Status: AC
  Filled 2014-05-06: qty 2

## 2014-05-06 MED ORDER — HEPARIN SODIUM (PORCINE) 5000 UNIT/ML IJ SOLN
5000.0000 [IU] | Freq: Three times a day (TID) | INTRAMUSCULAR | Status: DC
  Administered 2014-05-07 – 2014-05-10 (×7): 5000 [IU] via SUBCUTANEOUS
  Filled 2014-05-06 (×13): qty 1

## 2014-05-06 MED ORDER — PHENOL 1.4 % MT LIQD
1.0000 | OROMUCOSAL | Status: DC | PRN
  Filled 2014-05-06: qty 177

## 2014-05-06 MED ORDER — MORPHINE SULFATE 2 MG/ML IJ SOLN
2.0000 mg | INTRAMUSCULAR | Status: DC | PRN
  Administered 2014-05-06: 2 mg via INTRAVENOUS
  Filled 2014-05-06: qty 1

## 2014-05-06 MED ORDER — LABETALOL HCL 5 MG/ML IV SOLN
10.0000 mg | INTRAVENOUS | Status: DC | PRN
  Filled 2014-05-06: qty 4

## 2014-05-06 MED ORDER — LIDOCAINE HCL (CARDIAC) 20 MG/ML IV SOLN
INTRAVENOUS | Status: DC | PRN
  Administered 2014-05-06: 50 mg via INTRAVENOUS

## 2014-05-06 MED ORDER — PROPOFOL 10 MG/ML IV BOLUS
INTRAVENOUS | Status: DC | PRN
  Administered 2014-05-06: 150 mg via INTRAVENOUS

## 2014-05-06 MED ORDER — PROPOFOL 10 MG/ML IV BOLUS
INTRAVENOUS | Status: AC
  Filled 2014-05-06: qty 20

## 2014-05-06 MED ORDER — EPHEDRINE SULFATE 50 MG/ML IJ SOLN
INTRAMUSCULAR | Status: DC | PRN
  Administered 2014-05-06 (×3): 5 mg via INTRAVENOUS
  Administered 2014-05-06: 10 mg via INTRAVENOUS

## 2014-05-06 MED ORDER — HYDRALAZINE HCL 20 MG/ML IJ SOLN: 5.0000 mg | INTRAMUSCULAR | Status: DC | PRN

## 2014-05-06 MED ORDER — DOXERCALCIFEROL 4 MCG/2ML IV SOLN
2.5000 ug | INTRAVENOUS | Status: DC
Start: 2014-05-09 — End: 2014-05-10
  Administered 2014-05-09: 2.5 ug via INTRAVENOUS
  Filled 2014-05-06: qty 2

## 2014-05-06 MED ORDER — LIDOCAINE HCL (CARDIAC) 20 MG/ML IV SOLN
INTRAVENOUS | Status: AC
  Filled 2014-05-06: qty 5

## 2014-05-06 MED ORDER — 0.9 % SODIUM CHLORIDE (POUR BTL) OPTIME
TOPICAL | Status: DC | PRN
  Administered 2014-05-06: 1000 mL

## 2014-05-06 MED ORDER — ONDANSETRON HCL 4 MG/2ML IJ SOLN
INTRAMUSCULAR | Status: DC | PRN
  Administered 2014-05-06: 4 mg via INTRAVENOUS

## 2014-05-06 MED ORDER — NEOSTIGMINE METHYLSULFATE 10 MG/10ML IV SOLN
INTRAVENOUS | Status: AC
  Filled 2014-05-06: qty 1

## 2014-05-06 MED ORDER — GLYCOPYRROLATE 0.2 MG/ML IJ SOLN
INTRAMUSCULAR | Status: AC
  Filled 2014-05-06: qty 2

## 2014-05-06 MED ORDER — PANTOPRAZOLE SODIUM 40 MG PO TBEC
40.0000 mg | DELAYED_RELEASE_TABLET | Freq: Every day | ORAL | Status: DC
  Administered 2014-05-06 – 2014-05-10 (×5): 40 mg via ORAL
  Filled 2014-05-06 (×5): qty 1

## 2014-05-06 MED ORDER — EPHEDRINE SULFATE 50 MG/ML IJ SOLN
INTRAMUSCULAR | Status: AC
  Filled 2014-05-06: qty 1

## 2014-05-06 MED ORDER — ROCURONIUM BROMIDE 50 MG/5ML IV SOLN
INTRAVENOUS | Status: AC
  Filled 2014-05-06: qty 1

## 2014-05-06 MED ORDER — HYDROMORPHONE HCL 1 MG/ML IJ SOLN: 0.2500 mg | INTRAMUSCULAR | Status: DC | PRN

## 2014-05-06 MED ORDER — PIPERACILLIN-TAZOBACTAM IN DEX 2-0.25 GM/50ML IV SOLN
2.2500 g | Freq: Three times a day (TID) | INTRAVENOUS | Status: AC
  Administered 2014-05-06 – 2014-05-07 (×2): 2.25 g via INTRAVENOUS
  Filled 2014-05-06 (×2): qty 50

## 2014-05-06 MED ORDER — FENTANYL CITRATE 0.05 MG/ML IJ SOLN
INTRAMUSCULAR | Status: DC | PRN
  Administered 2014-05-06: 50 ug via INTRAVENOUS
  Administered 2014-05-06 (×2): 25 ug via INTRAVENOUS
  Administered 2014-05-06: 50 ug via INTRAVENOUS

## 2014-05-06 MED ORDER — METOPROLOL TARTRATE 1 MG/ML IV SOLN
2.0000 mg | INTRAVENOUS | Status: DC | PRN
  Filled 2014-05-06: qty 5

## 2014-05-06 MED ORDER — GUAIFENESIN-DM 100-10 MG/5ML PO SYRP
15.0000 mL | ORAL_SOLUTION | ORAL | Status: DC | PRN
  Filled 2014-05-06: qty 15

## 2014-05-06 MED ORDER — FENTANYL CITRATE 0.05 MG/ML IJ SOLN
INTRAMUSCULAR | Status: AC
  Filled 2014-05-06: qty 5

## 2014-05-06 SURGICAL SUPPLY — 51 items
BANDAGE ELASTIC 6 VELCRO ST LF (GAUZE/BANDAGES/DRESSINGS) ×2 IMPLANT
BANDAGE ESMARK 6X9 LF (GAUZE/BANDAGES/DRESSINGS) ×1 IMPLANT
BLADE SAW RECIP 87.9 MT (BLADE) ×2 IMPLANT
BNDG COHESIVE 6X5 TAN STRL LF (GAUZE/BANDAGES/DRESSINGS) ×2 IMPLANT
BNDG ESMARK 6X9 LF (GAUZE/BANDAGES/DRESSINGS) ×2
BNDG GAUZE ELAST 4 BULKY (GAUZE/BANDAGES/DRESSINGS) ×2 IMPLANT
CANISTER SUCTION 2500CC (MISCELLANEOUS) ×2 IMPLANT
CLIP TI MEDIUM 6 (CLIP) ×2 IMPLANT
COVER SURGICAL LIGHT HANDLE (MISCELLANEOUS) ×2 IMPLANT
COVER TABLE BACK 60X90 (DRAPES) ×2 IMPLANT
CUFF TOURNIQUET SINGLE 18IN (TOURNIQUET CUFF) IMPLANT
CUFF TOURNIQUET SINGLE 24IN (TOURNIQUET CUFF) ×2 IMPLANT
CUFF TOURNIQUET SINGLE 34IN LL (TOURNIQUET CUFF) IMPLANT
CUFF TOURNIQUET SINGLE 44IN (TOURNIQUET CUFF) IMPLANT
DRAIN CHANNEL 19F RND (DRAIN) IMPLANT
DRAPE ORTHO SPLIT 77X108 STRL (DRAPES) ×2
DRAPE PROXIMA HALF (DRAPES) ×4 IMPLANT
DRAPE SURG ORHT 6 SPLT 77X108 (DRAPES) ×2 IMPLANT
DRSG ADAPTIC 3X8 NADH LF (GAUZE/BANDAGES/DRESSINGS) ×2 IMPLANT
ELECT REM PT RETURN 9FT ADLT (ELECTROSURGICAL) ×2
ELECTRODE REM PT RTRN 9FT ADLT (ELECTROSURGICAL) ×1 IMPLANT
EVACUATOR SILICONE 100CC (DRAIN) IMPLANT
GAUZE SPONGE 4X4 12PLY STRL (GAUZE/BANDAGES/DRESSINGS) ×2 IMPLANT
GLOVE BIO SURGEON STRL SZ7.5 (GLOVE) ×2 IMPLANT
GOWN STRL REUS W/ TWL LRG LVL3 (GOWN DISPOSABLE) ×3 IMPLANT
GOWN STRL REUS W/TWL LRG LVL3 (GOWN DISPOSABLE) ×3
KIT BASIN OR (CUSTOM PROCEDURE TRAY) ×2 IMPLANT
KIT ROOM TURNOVER OR (KITS) ×2 IMPLANT
NS IRRIG 1000ML POUR BTL (IV SOLUTION) ×2 IMPLANT
PACK GENERAL/GYN (CUSTOM PROCEDURE TRAY) ×2 IMPLANT
PAD ARMBOARD 7.5X6 YLW CONV (MISCELLANEOUS) ×4 IMPLANT
PADDING CAST COTTON 6X4 STRL (CAST SUPPLIES) IMPLANT
STAPLER VISISTAT 35W (STAPLE) ×2 IMPLANT
STOCKINETTE IMPERVIOUS LG (DRAPES) ×2 IMPLANT
SUT ETHILON 3 0 PS 1 (SUTURE) IMPLANT
SUT SILK 0 TIES 10X30 (SUTURE) ×2 IMPLANT
SUT SILK 2 0 (SUTURE)
SUT SILK 2 0 SH CR/8 (SUTURE) ×4 IMPLANT
SUT SILK 2 0 TIES 17X18 (SUTURE) ×1
SUT SILK 2-0 18XBRD TIE 12 (SUTURE) IMPLANT
SUT SILK 2-0 18XBRD TIE BLK (SUTURE) ×1 IMPLANT
SUT SILK 3 0 TIES 17X18 (SUTURE) ×1
SUT SILK 3-0 18XBRD TIE BLK (SUTURE) ×1 IMPLANT
SUT VIC AB 2-0 CT1 18 (SUTURE) ×2 IMPLANT
SUT VIC AB 2-0 CT1 27 (SUTURE)
SUT VIC AB 2-0 CT1 TAPERPNT 27 (SUTURE) IMPLANT
SUT VIC AB 2-0 SH 18 (SUTURE) ×4 IMPLANT
SUT VIC AB 3-0 SH 27 (SUTURE)
SUT VIC AB 3-0 SH 27X BRD (SUTURE) IMPLANT
UNDERPAD 30X30 INCONTINENT (UNDERPADS AND DIAPERS) ×2 IMPLANT
WATER STERILE IRR 1000ML POUR (IV SOLUTION) IMPLANT

## 2014-05-06 NOTE — Interval H&P Note (Signed)
History and Physical Interval Note:  05/06/2014 7:30 AM  [name]  has presented today for surgery, with the diagnosis of Left PVD  The various methods of treatment have been discussed with the patient and family. After consideration of risks, benefits and other options for treatment, the patient has consented to  Procedure(s): AMPUTATION BELOW KNEE (Left) as a surgical intervention .  The patient's history has been reviewed, patient examined, no change in status, stable for surgery.  I have reviewed the patient's chart and labs.  Questions were answered to the patient's satisfaction.     Debby Clyne E

## 2014-05-06 NOTE — Anesthesia Postprocedure Evaluation (Signed)
  Anesthesia Post-op Note  Patient: [name]  Procedure(s) Performed: Procedure(s): AMPUTATION BELOW KNEE (Left)  Patient Location: PACU  Anesthesia Type:General  Level of Consciousness: awake  Airway and Oxygen Therapy: Patient Spontanous Breathing  Post-op Pain: mild  Post-op Assessment: Post-op Vital signs reviewed  Post-op Vital Signs: Reviewed  Last Vitals:  Filed Vitals:   05/06/14 1032  BP: 118/63  Pulse: 58  Temp:   Resp: 16    Complications: No apparent anesthesia complications

## 2014-05-06 NOTE — Progress Notes (Signed)
Patient refusing bed alarm this evening shift.  Re-educated patient on importance of utilizing bed alarm; patient still refused.  Non-skid socks refused.  Bed in lowest position.  Emphasized use of call bell if needed to use the bathroom; verbalized understanding.  Will continue to monitor patient.

## 2014-05-06 NOTE — Progress Notes (Signed)
[facility]  NAME:  Tolsma, Jobany                    ACCOUNT NO.:  MEDICAL RECORD NO.:  [id number]      DATE OF BIRTH:  20-Mar-1956  PHYSICIAN:  Judene Companion, M.D.      VISIT DATE:  05/04/2014                                  OFFICE VISIT   HISTORY OF PRESENT ILLNESS:  This is a 58 year old African American man who has type 1 diabetes and unfortunately has total renal failure and is on dialysis 3 times a week.  He also has hypertension, and he has had heart disease and has had stents placed in his heart.  His medications include hydrocodone, calcium acetate, aspirin, albuterol, Lopressor, and Nitrostat.  He presents to the [facility] with frank gangrene and he has 3 absolute gangrenous toes on his left foot.  His foot is extremely tender and has some cellulitis.  Because of this situation, I am sending him immediately to the emergency room for admission and later amputation.     Judene Companion, M.D.     PP/MEDQ  D:  05/04/2014  T:  05/05/2014  Job:  [id number]

## 2014-05-06 NOTE — Op Note (Signed)
Procedure: Left below-knee amputation  Preoperative diagnosis: Gangrene left foot  Postoperative diagnosis: Same  Anesthesia Gen.  Assistant: April Green, RNFA Upper findings: #1 calcified vessels with reasonably well perfused muscle tissue  Operative details: After obtaining informed consent, the patient was taken to the operating room. The patient was placed in supine position on the operating table. After induction of general anesthesia and endotracheal intubation, the patient's entire right lower extremity was prepped and draped all the way down to the level of the ankle. An esmarch was used to exsanguinate the leg.  A tourniquet was then inflated to 300 mm Hg on the thigh.  Next a transverse incision was made approximately 4 fingerbreadths below the tibial tuberosity on the right leg.  The  incision was carried posteriorly to the midportion of the leg and then extended longitudinally to create a posterior flap. The subcutaneous tissues and fascia was taken down with cautery. Periosteum was raised on the tibia approximately 5 cm above the skin edge.  The periosteum was also raised on the fibula several centimeters above this. The tibia was then divided with a saw. The fibula was divided with a bone cutter. The leg was then elevated in the operative field and the amputation was completed posterior to the bones with an amputation knife. Hemostasis was then obtained with cautery and several suture ligatures.  The vessels were severely calcified and despite the tourniquet there was still brisk arterial bleeding.  This was controlled with several 2 0 silk ties. The tibial and sural nerves were pulled down into the field transected and allowed to retract up into the leg.  After hemostasis was obtained, the wound was thoroughly irrigated with  normal saline solution. The fascial edges were then reapproximated with interrupted 2-0 Vicryl sutures. Subcutaneous tissues were reapproximated using running 3-0  Vicryl suture. The skin was closed with staples. The patient tolerated the procedure well and there were no complications. Instrument sponge and  needle counts were correct at the end of the case. The patient was taken to the recovery room in stable condition.  Ruta Hinds, MD [facility] Office: [phone] Pager: [phone]

## 2014-05-06 NOTE — Progress Notes (Signed)
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 aspirin  325 mg Oral Daily  . budesonide-formoterol  2 puff Inhalation BID  . calcium acetate  667 mg Oral TID WC  . furosemide  80 mg Oral BID  . heparin  5,000 Units Subcutaneous 3 times per day  . insulin aspart  0-9 Units Subcutaneous TID WC  . metoprolol tartrate  25 mg Oral BID  . piperacillin-tazobactam (ZOSYN)  IV  2.25 g Intravenous Q8H  . sevelamer carbonate  3,200 mg Oral TID WC  . simvastatin  20 mg Oral QHS  . sodium bicarbonate  650 mg Oral Daily  . sodium chloride  3 mL Intravenous Q12H  . sodium polystyrene  30 g Oral Once  . tiotropium  18 mcg Inhalation Daily  . vancomycin  1,000 mg Intravenous Q M,W,F-HD   Continuous Infusions:  PRN Meds:.acetaminophen **OR** acetaminophen, albuterol, morphine injection, ondansetron **OR** ondansetron (ZOFRAN) IV  Current Labs: reviewed    Physical Exam:  Blood pressure 115/65, pulse 61, temperature 98.1 F (36.7 C), temperature source Oral, resp. rate 16, height 6' 2.5" (1.892 m), weight 83.3 kg (183 lb 10.3 oz), SpO2 100 %. GEN: NAD ENT: NCAT EYES: EOMI CV: RRR, no rub. Nl s1s2 PULM: CTAB ABD: s/nt/nd SKIN: LLE bandaged EXT:No LEE, RUA with 2 large aneurysms, avf just lateral to these  Outpt HD Orders Unit: [facility] Days: MWF Time: 3h47min Dialyzer: [place] Polyflux EDW: 83.0kg K/Ca: 2 / 2.5 Access: AVG Needle Size: 14g BFR/DFR: 500 / 800 UF Proflie: profile 2 VDRA: Hectorol 2.5qTx EPO: none IV Fe: none Heparin: 2000 bolus, and 600/hr Most Recent Phos / PTH: unk Most Recent TSAT / Ferritin:  unk Most Recent eKT/V: unk Treatment Adherence: unk  A/P 1. ESRD 1. MWF [facility], missed 12/9 2. HD tomorrow 12/12; back on MWF after that 2. Anemia 1. Hb at goal 2. Not on maintenance Epo/Fe as outpt 3. HTN/Vol 1. Cont Lasix, MTP 2. EDW will need decrease post procedure 3. BP controlled currently 4. 2HPTH 1. Add on Phos 2. Cont sevelamer 3200 qAC + PhosLo 1qAC 3. Cont VDRA w/ HD 5. Gangrenous L Foot 1. S/p L BKA 12/11 w/ VVS 6. CAD 7. DM2  Pearson Grippe MD 05/06/2014, 1:58 PM   Recent Labs Lab 05/04/14 1737 05/04/14 2332 05/05/14 0348 05/06/14 0510  NA 135*  --  134* 137  K 5.5*  --  5.9* 5.4*  CL 102  --  95* 95*  CO2  --   --  21 26  GLUCOSE 114*  --  93 85  BUN 66*  --  80* 51*  CREATININE 10.80* 10.91* 11.28* 8.07*  CALCIUM  --   --  9.4 9.3    Recent Labs Lab 05/04/14 1727  05/04/14 2332 05/05/14 0348 05/06/14 0510  WBC 12.2*  --  10.5 10.2 9.0  NEUTROABS 9.5*  --   --  7.3  --   HGB 11.8*  < > 11.2* 11.3* 11.1*  HCT 35.2*  < > 33.0* 33.5* 33.3*  MCV  90.0  --  85.9 86.3 86.9  PLT 212  --  202 206 203  < > = values in this interval not displayed.

## 2014-05-06 NOTE — H&P (View-Only) (Signed)
Vascular Surgery Consultation  Reason for Consult: Gangrene left foot  HPI: [name] is a 58 y.o. male who presents for evaluation of gangrene left foot. Patient reported to the emergency department. He has had progressive gangrene of the left first and second toes in the proximal foot over the last several weeks. He went to the wound center today and but was seen by Dr. Judene Companion who referred him to the emergency department. He is a patient on hemodialysis Monday Wednesday and Friday but did not have dialysis today because of his wound center appointment. He normally dialyzes in [place]. He denies any chills and fever. He does have a history of coronary artery disease but denies any recent chest pain. He has previously had stents placed in coronary arteries in the past.   Past Medical History  Diagnosis Date  . End stage renal disease   . HTN (hypertension)   . HLD (hyperlipidemia)   . Anemia   . Hyperparathyroidism   . Diabetes mellitus without complication    Past Surgical History  Procedure Laterality Date  . Cardiac catheterization      stent placement    History   Social History  . Marital Status: Widowed    Spouse Name: N/A    Number of Children: N/A  . Years of Education: N/A   Social History Main Topics  . Smoking status: Current Every Day Smoker  . Smokeless tobacco: None     Comment: 5 cigs per day   . Alcohol Use: No  . Drug Use: No     Comment: has used crack cocaine in past   . Sexual Activity: None   Other Topics Concern  . None   Social History Narrative   Engaged.    Family History  Problem Relation Age of Onset  . Heart failure Other   . Hypertension Other   . Leukemia Other   . Diabetes Other    No Known Allergies Prior to Admission medications   Medication Sig Start Date End Date Taking? Authorizing Provider  acetaminophen (TYLENOL) 500 MG tablet Take 1,000 mg by mouth every 6 (six) hours as needed for moderate pain.   Yes  Historical Provider, MD  albuterol (PROVENTIL HFA;VENTOLIN HFA) 108 (90 BASE) MCG/ACT inhaler Inhale 1 puff into the lungs every 6 (six) hours as needed for wheezing or shortness of breath.   Yes Historical Provider, MD  aspirin 325 MG tablet Take 325 mg by mouth daily.    Yes Historical Provider, MD  budesonide-formoterol (SYMBICORT) 160-4.5 MCG/ACT inhaler Inhale 2 puffs into the lungs 2 (two) times daily.   Yes Historical Provider, MD  calcium acetate, Phos Binder, (PHOSLYRA) 667 MG/5ML SOLN Take 667 mg by mouth 3 (three) times daily with meals.   Yes Historical Provider, MD  clindamycin (CLEOCIN) 300 MG capsule Take 300 mg by mouth every 6 (six) hours.   Yes Historical Provider, MD  furosemide (LASIX) 80 MG tablet Take 80 mg by mouth 2 (two) times daily.   Yes Historical Provider, MD  metoprolol tartrate (LOPRESSOR) 25 MG tablet Take 25 mg by mouth 2 (two) times daily.     Yes Historical Provider, MD  sevelamer carbonate (RENVELA) 800 MG tablet Take 800 mg by mouth 3 (three) times daily with meals.   Yes Historical Provider, MD  simvastatin (ZOCOR) 20 MG tablet Take 20 mg by mouth at bedtime.    Yes Historical Provider, MD  sodium bicarbonate 650 MG tablet Take 650 mg by  mouth daily.   Yes Historical Provider, MD  tiotropium (SPIRIVA) 18 MCG inhalation capsule Place 18 mcg into inhaler and inhale daily.   Yes Historical Provider, MD  cephALEXin (KEFLEX) 500 MG capsule Take 1 capsule (500 mg total) by mouth 4 (four) times daily. Patient not taking: Reported on 05/04/2014 04/05/14   Alvina Chou, PA-C  HYDROcodone-acetaminophen (NORCO/VICODIN) 5-325 MG per tablet Take 2 tablets by mouth every 4 (four) hours as needed for moderate pain or severe pain. Patient not taking: Reported on 05/04/2014 04/05/14   Alvina Chou, PA-C  levofloxacin (LEVAQUIN) 500 MG tablet Take 500 mg by mouth daily.    Historical Provider, MD  sevelamer (RENAGEL) 800 MG tablet Take 800 mg by mouth 3 (three) times  daily. Take 2 tabs    Historical Provider, MD     Positive ROS: Unable to ambulate because of pain in left foot. Has hemodialysis Monday Wednesday Friday through access in right upper extremity. Patient also known to have gallstones and was scheduled for cholecystectomy in the near future at [facility].  All other systems have been reviewed and were otherwise negative with the exception of those mentioned in the HPI and as above.  Physical Exam: Filed Vitals:   05/04/14 1930  BP: 139/89  Pulse: 67  Temp:   Resp: 20    General: Alert, no acute distress HEENT: Normal for age Cardiovascular: Regular rate and rhythm. Carotid pulses 2+, no bruits audible Respiratory: Clear to auscultation. No cyanosis, no use of accessory musculature GI: No organomegaly, abdomen is soft and non-tender Skin: No lesions in the area of chief complaint Neurologic: Sensation intact distally Psychiatric: Patient is competent for consent with normal mood and affect Musculoskeletal: No obvious deformities Extremities: Right leg with 3+ femoral pulse but no distal pulses palpable. No evidence of ischemia or cellulitis. Left leg with gangrene involving the entire left foot on the dorsum with progressive gangrene of the first and second toes. He has 3+ femoral pulse but no distal pul   Assessme patient needs left below-knee amputation versus above-knee amputation. He needs to be transferred to [facility] and evaluated by the renal service. He will need hemodialysis tomorrow-Thursday. nt/Plan: We will see again tomorrow and plan amputation of left leg on Friday. Will need IV antibiotics and pain medication prior to that time and hopefully can perform left leg amputation midday on Friday   Tinnie Gens, MD 05/04/2014 8:03 PM

## 2014-05-06 NOTE — Transfer of Care (Signed)
Immediate Anesthesia Transfer of Care Note  Patient: [name]  Procedure(s) Performed: Procedure(s): AMPUTATION BELOW KNEE (Left)  Patient Location: PACU  Anesthesia Type:General  Level of Consciousness: awake, alert , oriented and patient cooperative  Airway & Oxygen Therapy: Patient Spontanous Breathing and Patient connected to nasal cannula oxygen  Post-op Assessment: Report given to PACU RN and Post -op Vital signs reviewed and stable  Post vital signs: Reviewed and stable  Complications: No apparent anesthesia complications

## 2014-05-06 NOTE — Progress Notes (Signed)
[facility] PROGRESS NOTE  [name] [id number] DOB: [birth year] DOA: 05/04/2014 PCP: Donnie Coffin, MD   HPI/Subjective:  In bed post L BKA, no headache, no chest-abd pain, no SOB, no deficits.    Assessment/Plan:   1. L foot gangrene 1. Vascular surgery following, post L BKA 05-06-14 2. On empiric Vancomycin and Zosyn, DC in 1 day    2. Hx asthma 1. Lungs currently clear without wheezing 2. Pt reports improvement with proair hfa at home 3. Pt is currently continued with PRN albuterol nebs - will continue   3. ESRD on MWF HD 1. Nephrology following    4. CAD s/p stent 1. Stable   5. HLD 1. On statin   6. HTN 1. BP stable, Cont current regimen, cont to monitor   7. DM2 1. Glucose stable and controlled  Lab Results  Component Value Date   HGBA1C 6.2* 05/05/2014    CBG (last 3)   Recent Labs  05/05/14 2108 05/06/14 0529 05/06/14 0933  GLUCAP 94 98 84      8. Chronic anemia 1. Stable, Cont to monitor     DVT prophylaxis   - Heparin SQ    Code Status: Full Family Communication: Pt in room Disposition Plan: Pending  Consultants:  Nephrology  Vascular Surgery   Anti-infectives    Start     Dose/Rate Route Frequency Ordered Stop   05/06/14 1200  vancomycin (VANCOCIN) IVPB 1000 mg/200 mL premix     1,000 mg200 mL/hr over 60 Minutes Intravenous Every M-W-F (Hemodialysis) 05/04/14 2236     05/05/14 1200  vancomycin (VANCOCIN) IVPB 1000 mg/200 mL premix     1,000 mg200 mL/hr over 60 Minutes Intravenous Every Thu (Hemodialysis) 05/04/14 2236 05/05/14 1621   05/04/14 2300  piperacillin-tazobactam (ZOSYN) IVPB 2.25 g     2.25 g100 mL/hr over 30 Minutes Intravenous Every 8 hours 05/04/14 2233     05/04/14 2300  vancomycin (VANCOCIN) 2,000 mg in sodium chloride 0.9 % 500 mL IVPB     2,000 mg250 mL/hr over 120 Minutes Intravenous  Once 05/04/14 2236 05/05/14 0243        Objective: Filed Vitals:   05/06/14 1015  05/06/14 1017 05/06/14 1030 05/06/14 1032  BP:  110/61  118/63  Pulse: 58 59 57 58  Temp:   98.1 F (36.7 C)   TempSrc:      Resp: 16 18 14 16   Height:      Weight:      SpO2: 99% 99% 100% 98%    Intake/Output Summary (Last 24 hours) at 05/06/14 1120 Last data filed at 05/06/14 1037  Gross per 24 hour  Intake   1028 ml  Output   3644 ml  Net  -2616 ml   Filed Weights   05/05/14 1237 05/05/14 1605 05/05/14 2109  Weight: 87 kg (191 lb 12.8 oz) 83.3 kg (183 lb 10.3 oz) 83.3 kg (183 lb 10.3 oz)    Exam:   General:  Awake, in nad  Cardiovascular: regular, s1, s2  Respiratory: normal resp effort, no wheezing  Abdomen: soft,nondistended  Musculoskeletal: L. BKA dressing in place, dry, intact   Data Reviewed: Basic Metabolic Panel:  Recent Labs Lab 05/04/14 1737 05/04/14 2332 05/05/14 0348 05/06/14 0510  NA 135*  --  134* 137  K 5.5*  --  5.9* 5.4*  CL 102  --  95* 95*  CO2  --   --  21 26  GLUCOSE 114*  --  93  85  BUN 66*  --  80* 51*  CREATININE 10.80* 10.91* 11.28* 8.07*  CALCIUM  --   --  9.4 9.3   Liver Function Tests:  Recent Labs Lab 05/05/14 0348  AST 39*  ALT 23  ALKPHOS 182*  BILITOT 1.0  PROT 7.6  ALBUMIN 2.4*   No results for input(s): LIPASE, AMYLASE in the last 168 hours. No results for input(s): AMMONIA in the last 168 hours. CBC:  Recent Labs Lab 05/04/14 1727 05/04/14 1737 05/04/14 2332 05/05/14 0348 05/06/14 0510  WBC 12.2*  --  10.5 10.2 9.0  NEUTROABS 9.5*  --   --  7.3  --   HGB 11.8* 13.9 11.2* 11.3* 11.1*  HCT 35.2* 41.0 33.0* 33.5* 33.3*  MCV 90.0  --  85.9 86.3 86.9  PLT 212  --  202 206 203   Cardiac Enzymes: No results for input(s): CKTOTAL, CKMB, CKMBINDEX, TROPONINI in the last 168 hours. BNP (last 3 results) No results for input(s): PROBNP in the last 8760 hours. CBG:  Recent Labs Lab 05/05/14 1140 05/05/14 1633 05/05/14 2108 05/06/14 0529 05/06/14 0933  GLUCAP 107* 120* 94 98 84    Recent  Results (from the past 240 hour(s))  MRSA PCR Screening     Status: None   Collection Time: 05/05/14  1:05 AM  Result Value Ref Range Status   MRSA by PCR NEGATIVE NEGATIVE Final    Comment:        The GeneXpert MRSA Assay (FDA approved for NASAL specimens only), is one component of a comprehensive MRSA colonization surveillance program. It is not intended to diagnose MRSA infection nor to guide or monitor treatment for MRSA infections.   Surgical pcr screen     Status: None   Collection Time: 05/05/14 10:30 PM  Result Value Ref Range Status   MRSA, PCR NEGATIVE NEGATIVE Final   Staphylococcus aureus NEGATIVE NEGATIVE Final    Comment:        The Xpert SA Assay (FDA approved for NASAL specimens in patients over 18 years of age), is one component of a comprehensive surveillance program.  Test performance has been validated by [employer] for patients greater than or equal to 16 year old. It is not intended to diagnose infection nor to guide or monitor treatment.      Studies: Dg Chest 2 View  05/04/2014   CLINICAL DATA:  Gangrene of the toes on the left foot.  EXAM: CHEST  2 VIEW  COMPARISON:  03/12/2014  FINDINGS: There is chronic cardiomegaly. Pulmonary vascularity is normal. There is a persistent moderate right pleural effusion, slightly diminished since the prior exam. Left lung is clear.  No acute osseous abnormality.  IMPRESSION: Slight decrease in moderate right pleural effusion.   Electronically Signed   By: Rozetta Nunnery M.D.   On: 05/04/2014 18:08   Dg Foot Complete Left  05/04/2014   CLINICAL DATA:  Subsequent encounter for diabetes with gangrene on left toes.  EXAM: LEFT FOOT - COMPLETE 3+ VIEW  COMPARISON:  04/12/2014.  FINDINGS: No evidence of fracture. No subluxation or dislocation. There is some lucency over the second toe in tip of the great toe suggesting soft tissue disease. No underlying osteomyelitis or destruction of bony anatomy is evident.  IMPRESSION:  Stable. Soft tissue defects in the first and second toes without evidence for underlying bony destruction.   Electronically Signed   By: Misty Stanley M.D.   On: 05/04/2014 17:51    Scheduled Meds: .  aspirin  325 mg Oral Daily  . budesonide-formoterol  2 puff Inhalation BID  . calcium acetate  667 mg Oral TID WC  . furosemide  80 mg Oral BID  . heparin  5,000 Units Subcutaneous 3 times per day  . insulin aspart  0-9 Units Subcutaneous TID WC  . metoprolol tartrate  25 mg Oral BID  . piperacillin-tazobactam (ZOSYN)  IV  2.25 g Intravenous Q8H  . sevelamer carbonate  3,200 mg Oral TID WC  . simvastatin  20 mg Oral QHS  . sodium bicarbonate  650 mg Oral Daily  . sodium chloride  3 mL Intravenous Q12H  . sodium polystyrene  30 g Oral Once  . tiotropium  18 mcg Inhalation Daily  . vancomycin  1,000 mg Intravenous Q M,W,F-HD   Continuous Infusions:   Principal Problem:   Gangrene of foot Active Problems:   HYPERTENSION, BENIGN   ESRD (end stage renal disease) on dialysis   CAD (coronary artery disease)   Normocytic anemia  Time spent: 60min  [facility] Pager [phone]. If 7PM-7AM, please contact night-coverage at [email or web address], password [facility] 05/06/2014, 11:20 AM  LOS: 2 days

## 2014-05-06 NOTE — Anesthesia Preprocedure Evaluation (Addendum)
Anesthesia Evaluation  Patient identified by MRN, date of birth, ID band Patient awake    Reviewed: Allergy & Precautions, H&P , NPO status , Patient's Chart, lab work & pertinent test results  Airway Mallampati: II  TM Distance: >3 FB Neck ROM: Full    Dental  (+) Edentulous Upper, Dental Advisory Given   Pulmonary neg pulmonary ROS, Current Smoker,  breath sounds clear to auscultation        Cardiovascular hypertension, Pt. on medications and Pt. on home beta blockers + CAD Rhythm:Regular Rate:Normal     Neuro/Psych negative neurological ROS  negative psych ROS   GI/Hepatic negative GI ROS, Neg liver ROS,   Endo/Other  diabetes, Well Controlled  Renal/GU ESRF and DialysisRenal disease     Musculoskeletal   Abdominal   Peds  Hematology  (+) anemia ,   Anesthesia Other Findings   Reproductive/Obstetrics                          Anesthesia Physical Anesthesia Plan  ASA: III  Anesthesia Plan: General   Post-op Pain Management:    Induction: Intravenous  Airway Management Planned: Oral ETT  Additional Equipment:   Intra-op Plan:   Post-operative Plan: Possible Post-op intubation/ventilation  Informed Consent: I have reviewed the patients History and Physical, chart, labs and discussed the procedure including the risks, benefits and alternatives for the proposed anesthesia with the patient or authorized representative who has indicated his/her understanding and acceptance.     Plan Discussed with: CRNA and Anesthesiologist  Anesthesia Plan Comments:         Anesthesia Quick Evaluation

## 2014-05-07 LAB — RENAL FUNCTION PANEL
ALBUMIN: 2.2 g/dL — AB (ref 3.5–5.2)
Anion gap: 17 — ABNORMAL HIGH (ref 5–15)
BUN: 67 mg/dL — AB (ref 6–23)
CALCIUM: 9 mg/dL (ref 8.4–10.5)
CO2: 24 mEq/L (ref 19–32)
CREATININE: 9.76 mg/dL — AB (ref 0.50–1.35)
Chloride: 95 mEq/L — ABNORMAL LOW (ref 96–112)
GFR calc Af Amer: 6 mL/min — ABNORMAL LOW (ref >90)
GFR, EST NON AFRICAN AMERICAN: 5 mL/min — AB (ref >90)
Glucose, Bld: 93 mg/dL (ref 70–99)
Phosphorus: 7.5 mg/dL — ABNORMAL HIGH (ref 2.3–4.6)
Potassium: 6.4 mEq/L — ABNORMAL HIGH (ref 3.7–5.3)
Sodium: 136 mEq/L — ABNORMAL LOW (ref 137–147)

## 2014-05-07 LAB — GLUCOSE, CAPILLARY
Glucose-Capillary: 95 mg/dL (ref 70–99)
Glucose-Capillary: 122 mg/dL — ABNORMAL HIGH (ref 70–99)
Glucose-Capillary: 94 mg/dL (ref 70–99)
Glucose-Capillary: 99 mg/dL (ref 70–99)

## 2014-05-07 LAB — FERRITIN: Ferritin: 586 ng/mL — ABNORMAL HIGH (ref 22–322)

## 2014-05-07 LAB — CBC
HCT: 29.2 % — ABNORMAL LOW (ref 39.0–52.0)
HEMOGLOBIN: 9.8 g/dL — AB (ref 13.0–17.0)
MCH: 29.1 pg (ref 26.0–34.0)
MCHC: 33.6 g/dL (ref 30.0–36.0)
MCV: 86.6 fL (ref 78.0–100.0)
Platelets: 216 10*3/uL (ref 150–400)
RBC: 3.37 MIL/uL — ABNORMAL LOW (ref 4.22–5.81)
RDW: 16.7 % — AB (ref 11.5–15.5)
WBC: 10.7 10*3/uL — ABNORMAL HIGH (ref 4.0–10.5)

## 2014-05-07 LAB — IRON AND TIBC
IRON: 26 ug/dL — AB (ref 42–135)
SATURATION RATIOS: 15 % — AB (ref 20–55)
TIBC: 179 ug/dL — ABNORMAL LOW (ref 215–435)
UIBC: 153 ug/dL (ref 125–400)

## 2014-05-07 LAB — POTASSIUM: Potassium: 4.3 mEq/L (ref 3.7–5.3)

## 2014-05-07 LAB — HEPATITIS B SURFACE ANTIGEN: HEP B S AG: NEGATIVE

## 2014-05-07 MED ORDER — PENTAFLUOROPROP-TETRAFLUOROETH EX AERO: 1.0000 "application " | INHALATION_SPRAY | CUTANEOUS | Status: DC | PRN

## 2014-05-07 MED ORDER — HEPARIN SODIUM (PORCINE) 1000 UNIT/ML DIALYSIS: 1000.0000 [IU] | INTRAMUSCULAR | Status: DC | PRN

## 2014-05-07 MED ORDER — SODIUM CHLORIDE 0.9 % IV SOLN: 100.0000 mL | INTRAVENOUS | Status: DC | PRN

## 2014-05-07 MED ORDER — ALTEPLASE 2 MG IJ SOLR
2.0000 mg | Freq: Once | INTRAMUSCULAR | Status: DC | PRN
  Filled 2014-05-07: qty 2

## 2014-05-07 MED ORDER — LIDOCAINE-PRILOCAINE 2.5-2.5 % EX CREA: 1.0000 "application " | TOPICAL_CREAM | CUTANEOUS | Status: DC | PRN

## 2014-05-07 MED ORDER — DARBEPOETIN ALFA 40 MCG/0.4ML IJ SOSY
40.0000 ug | PREFILLED_SYRINGE | INTRAMUSCULAR | Status: DC
  Filled 2014-05-07: qty 0.4

## 2014-05-07 MED ORDER — HEPARIN SODIUM (PORCINE) 1000 UNIT/ML DIALYSIS: 20.0000 [IU]/kg | INTRAMUSCULAR | Status: DC | PRN

## 2014-05-07 MED ORDER — LIDOCAINE HCL (PF) 1 % IJ SOLN: 5.0000 mL | INTRAMUSCULAR | Status: DC | PRN

## 2014-05-07 MED ORDER — NEPRO/CARBSTEADY PO LIQD
237.0000 mL | ORAL | Status: DC | PRN
  Filled 2014-05-07: qty 237

## 2014-05-07 MED ORDER — DARBEPOETIN ALFA 40 MCG/0.4ML IJ SOSY
PREFILLED_SYRINGE | INTRAMUSCULAR | Status: AC
  Filled 2014-05-07: qty 0.4

## 2014-05-07 MED ORDER — MORPHINE SULFATE 2 MG/ML IJ SOLN
INTRAMUSCULAR | Status: AC
  Filled 2014-05-07: qty 1

## 2014-05-07 NOTE — Progress Notes (Signed)
[facility] PROGRESS NOTE  [name] [id number] DOB: Oct 18, 1955 DOA: 05/04/2014 PCP: Donnie Coffin, MD   HPI/Subjective:  In bed post L BKA, no headache, no chest-abd pain, no SOB, no deficits.    Assessment/Plan:   1. L foot gangrene 1. 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 PT eval did may require placement. 2. On empiric Vancomycin and Zosyn, DC to today's dose.    2. Hx asthma 1. Lungs currently clear without wheezing 2. Pt reports improvement with proair hfa at home 3. Pt is currently continued with PRN albuterol nebs - will continue   3. ESRD on MWF HD 1. Nephrology following, due for dialysis later today.    4. CAD s/p stent 1. Stable, chest pain-free no acute issues.   5. HLD 1. On statin   6. HTN 1. BP stable, Cont current regimen, cont to monitor   7. DM2 1. Glucose stable and controlled  Lab Results  Component Value Date   HGBA1C 6.2* 05/05/2014    CBG (last 3)   Recent Labs  05/06/14 1626 05/06/14 2126 05/07/14 0757  GLUCAP 102* 131* 95      8. Anemia of chronic disease  - stable, Cont to monitor     DVT prophylaxis   - Heparin SQ    Code Status: Full Family Communication: Pt in room Disposition Plan: Pending  Consultants:  Nephrology  Vascular Surgery   Anti-infectives    Start     Dose/Rate Route Frequency Ordered Stop   05/07/14 1200  vancomycin (VANCOCIN) IVPB 1000 mg/200 mL premix    Comments:  Discontinue after dose on 12/12   1,000 mg200 mL/hr over 60 Minutes Intravenous Every Sat (Hemodialysis) 05/06/14 2029 05/14/14 1159   05/07/14 0000  piperacillin-tazobactam (ZOSYN) IVPB 2.25 g     2.25 g100 mL/hr over 30 Minutes Intravenous Every 8 hours 05/06/14 2029 05/07/14 0843   05/06/14 1200  vancomycin (VANCOCIN) IVPB 1000 mg/200 mL premix  Status:  Discontinued     1,000 mg200 mL/hr over 60 Minutes Intravenous Every M-W-F  (Hemodialysis) 05/04/14 2236 05/06/14 2029   05/05/14 1200  vancomycin (VANCOCIN) IVPB 1000 mg/200 mL premix     1,000 mg200 mL/hr over 60 Minutes Intravenous Every Thu (Hemodialysis) 05/04/14 2236 05/05/14 1621   05/04/14 2300  piperacillin-tazobactam (ZOSYN) IVPB 2.25 g  Status:  Discontinued     2.25 g100 mL/hr over 30 Minutes Intravenous Every 8 hours 05/04/14 2233 05/06/14 2029   05/04/14 2300  vancomycin (VANCOCIN) 2,000 mg in sodium chloride 0.9 % 500 mL IVPB     2,000 mg250 mL/hr over 120 Minutes Intravenous  Once 05/04/14 2236 05/05/14 0243        Objective: Filed Vitals:   05/06/14 2101 05/06/14 2129 05/07/14 0519 05/07/14 0801  BP:  145/84 141/75 138/79  Pulse:  80 72 72  Temp:  99.8 F (37.7 C) 98.8 F (37.1 C) 99.3 F (37.4 C)  TempSrc:  Oral Oral Oral  Resp:  18 18 17   Height:      Weight:  83.301 kg (183 lb 10.3 oz)    SpO2: 98% 100% 100% 96%    Intake/Output Summary (Last 24 hours) at 05/07/14 0851 Last data filed at 05/07/14 0532  Gross per 24 hour  Intake   1028 ml  Output    100 ml  Net    928 ml   Filed Weights   05/05/14 1605 05/05/14 2109 05/06/14 2129  Weight: 83.3 kg (183 lb 10.3 oz) 83.3 kg (183 lb 10.3 oz) 83.301 kg (183 lb 10.3 oz)    Exam:   General:  Awake, in nad  Cardiovascular: regular, s1, s2  Respiratory: normal resp effort, no wheezing  Abdomen: soft,nondistended  Musculoskeletal: L. BKA dressing in place, dry, intact   Data Reviewed: Basic Metabolic Panel:  Recent Labs Lab 05/04/14 1737 05/04/14 2332 05/05/14 0348 05/06/14 0510 05/06/14 2100  NA 135*  --  134* 137  --   K 5.5*  --  5.9* 5.4*  --   CL 102  --  95* 95*  --   CO2  --   --  21 26  --   GLUCOSE 114*  --  93 85  --   BUN 66*  --  80* 51*  --   CREATININE 10.80* 10.91* 11.28* 8.07* 8.94*  CALCIUM  --   --  9.4 9.3  --    Liver Function Tests:  Recent Labs Lab 05/05/14 0348  AST 39*  ALT 23  ALKPHOS 182*  BILITOT 1.0  PROT 7.6  ALBUMIN 2.4*    No results for input(s): LIPASE, AMYLASE in the last 168 hours. No results for input(s): AMMONIA in the last 168 hours. CBC:  Recent Labs Lab 05/04/14 1727 05/04/14 1737 05/04/14 2332 05/05/14 0348 05/06/14 0510 05/06/14 2100  WBC 12.2*  --  10.5 10.2 9.0 11.2*  NEUTROABS 9.5*  --   --  7.3  --   --   HGB 11.8* 13.9 11.2* 11.3* 11.1* 10.3*  HCT 35.2* 41.0 33.0* 33.5* 33.3* 30.7*  MCV 90.0  --  85.9 86.3 86.9 87.0  PLT 212  --  202 206 203 222   Cardiac Enzymes: No results for input(s): CKTOTAL, CKMB, CKMBINDEX, TROPONINI in the last 168 hours. BNP (last 3 results) No results for input(s): PROBNP in the last 8760 hours. CBG:  Recent Labs Lab 05/06/14 0933 05/06/14 1124 05/06/14 1626 05/06/14 2126 05/07/14 0757  GLUCAP 84 82 102* 131* 95    Recent Results (from the past 240 hour(s))  MRSA PCR Screening     Status: None   Collection Time: 05/05/14  1:05 AM  Result Value Ref Range Status   MRSA by PCR NEGATIVE NEGATIVE Final    Comment:        The GeneXpert MRSA Assay (FDA approved for NASAL specimens only), is one component of a comprehensive MRSA colonization surveillance program. It is not intended to diagnose MRSA infection nor to guide or monitor treatment for MRSA infections.   Surgical pcr screen     Status: None   Collection Time: 05/05/14 10:30 PM  Result Value Ref Range Status   MRSA, PCR NEGATIVE NEGATIVE Final   Staphylococcus aureus NEGATIVE NEGATIVE Final    Comment:        The Xpert SA Assay (FDA approved for NASAL specimens in patients over 66 years of age), is one component of a comprehensive surveillance program.  Test performance has been validated by [employer] for patients greater than or equal to 2 year old. It is not intended to diagnose infection nor to guide or monitor treatment.      Studies: No results found.  Scheduled Meds: . aspirin  325 mg Oral Daily  . budesonide-formoterol  2 puff Inhalation BID  .  calcium acetate  667 mg Oral TID WC  . docusate sodium  100 mg Oral Daily  . [START ON 05/09/2014] doxercalciferol  2.5 mcg Intravenous  Q M,W,F-HD  . furosemide  80 mg Oral BID  . heparin  5,000 Units Subcutaneous 3 times per day  . insulin aspart  0-9 Units Subcutaneous TID WC  . metoprolol tartrate  25 mg Oral BID  . pantoprazole  40 mg Oral Daily  . sevelamer carbonate  3,200 mg Oral TID WC  . simvastatin  20 mg Oral QHS  . sodium bicarbonate  650 mg Oral Daily  . sodium chloride  3 mL Intravenous Q12H  . sodium polystyrene  30 g Oral Once  . tiotropium  18 mcg Inhalation Daily  . vancomycin  1,000 mg Intravenous Q Sat-HD   Continuous Infusions:   Principal Problem:   Gangrene of foot Active Problems:   HYPERTENSION, BENIGN   ESRD (end stage renal disease) on dialysis   CAD (coronary artery disease)   Normocytic anemia  Time spent: 46min  [facility] Pager [phone]. If 7PM-7AM, please contact night-coverage at [email or web address], password [facility] 05/07/2014, 8:51 AM  LOS: 3 days

## 2014-05-07 NOTE — Progress Notes (Signed)
Patient refusing bed alarm this evening shift.  Re-educated patient on importance of utilizing bed alarm; patient still refused.  Non-skid socks refused.  Bed in lowest position.  Emphasized use of call bell if needed to use the bathroom; verbalized understanding.  Will continue to monitor patient.

## 2014-05-07 NOTE — Procedures (Signed)
I was present at this dialysis session. I have reviewed the session itself and made appropriate changes.   Having high AP on arterial side.  Perhaps needle positioning.  Will readjust. Goal UF 3L, will move EDW downwards  Pearson Grippe  MD 05/07/2014, 2:28 PM

## 2014-05-07 NOTE — Progress Notes (Signed)
   VASCULAR SURGERY ASSESSMENT & PLAN:  * 1 Day Post-Op s/p: left BKA  *  Begin dressing changes tomorrow  * CIR/ PTx  SUBJECTIVE: Pain well controlled  PHYSICAL EXAM: Filed Vitals:   05/06/14 1736 05/06/14 2101 05/06/14 2129 05/07/14 0519  BP: 138/83  145/84 141/75  Pulse: 75  80 72  Temp: 98.6 F (37 C)  99.8 F (37.7 C) 98.8 F (37.1 C)  TempSrc: Oral  Oral Oral  Resp: 18  18 18   Height:      Weight:   183 lb 10.3 oz (83.301 kg)   SpO2: 100% 98% 100% 100%   Dressing on left BKA is dry  LABS: Lab Results  Component Value Date   WBC 11.2* 05/06/2014   HGB 10.3* 05/06/2014   HCT 30.7* 05/06/2014   MCV 87.0 05/06/2014   PLT 222 05/06/2014   Lab Results  Component Value Date   CREATININE 8.94* 05/06/2014    Recent Labs  05/06/14 1124 05/06/14 1626 05/06/14 2126  GLUCAP 82 102* 131*    Principal Problem:   Gangrene of foot Active Problems:   HYPERTENSION, BENIGN   ESRD (end stage renal disease) on dialysis   CAD (coronary artery disease)   Normocytic anemia   Gae Gallop Beeper: [id number] 05/07/2014

## 2014-05-07 NOTE — Progress Notes (Signed)
Admit: 05/04/2014 LOS: 3  63M ESRD ([facility]) MWF via RUA AVG with gangrenous L foot s/p BKA 05/06/14  Subjective:  No new events Pain controlled For HD today  12/11 0701 - 12/12 0700 In: [id number] [P.O.:600; I.V.:428] Out: 250 [Blood:250]  Filed Weights   05/05/14 1605 05/05/14 2109 05/06/14 2129  Weight: 83.3 kg (183 lb 10.3 oz) 83.3 kg (183 lb 10.3 oz) 83.301 kg (183 lb 10.3 oz)    Scheduled Meds: . aspirin  325 mg Oral Daily  . budesonide-formoterol  2 puff Inhalation BID  . calcium acetate  667 mg Oral TID WC  . docusate sodium  100 mg Oral Daily  . [START ON 05/09/2014] doxercalciferol  2.5 mcg Intravenous Q M,W,F-HD  . furosemide  80 mg Oral BID  . heparin  5,000 Units Subcutaneous 3 times per day  . insulin aspart  0-9 Units Subcutaneous TID WC  . metoprolol tartrate  25 mg Oral BID  . pantoprazole  40 mg Oral Daily  . sevelamer carbonate  3,200 mg Oral TID WC  . simvastatin  20 mg Oral QHS  . sodium bicarbonate  650 mg Oral Daily  . sodium chloride  3 mL Intravenous Q12H  . sodium polystyrene  30 g Oral Once  . tiotropium  18 mcg Inhalation Daily  . vancomycin  1,000 mg Intravenous Q Sat-HD   Continuous Infusions:  PRN Meds:.acetaminophen **OR** acetaminophen, albuterol, guaiFENesin-dextromethorphan, hydrALAZINE, labetalol, metoprolol, morphine injection, ondansetron **OR** ondansetron (ZOFRAN) IV, phenol  Current Labs: reviewed    Physical Exam:  Blood pressure 138/79, pulse 72, temperature 99.3 F (37.4 C), temperature source Oral, resp. rate 17, height 6' 2.5" (1.892 m), weight 83.301 kg (183 lb 10.3 oz), SpO2 96 %. GEN: NAD ENT: NCAT EYES: EOMI CV: RRR, no rub. Nl s1s2 PULM: CTAB ABD: s/nt/nd SKIN: LLE bandaged EXT:No LEE, RUA with 2 large aneurysms, avf just lateral to these  Outpt HD Orders Unit: [facility] Days: MWF Time: 3h24min Dialyzer: Gambro Polyflux EDW: 83.0kg K/Ca: 2 / 2.5 Access: AVG Needle Size: 14g BFR/DFR: 500 /  800 UF Proflie: profile 2 VDRA: Hectorol 2.5qTx EPO: none IV Fe: none Heparin: 2000 bolus, and 600/hr Most Recent Phos / PTH: unk Most Recent TSAT / Ferritin: unk Most Recent eKT/V: unk Treatment Adherence: unk  A/P 1. ESRD 1. MWF [facility], missed 12/9 2. HD today; back on MWF after that 2. Anemia 1. Hb at goal 2. Not on maintenance Epo/Fe as outpt 3. Add low dose ESA today given recent surgery; Check TSAT 3. HTN/Vol 1. Cont Lasix, MTP 2. EDW will need decrease post procedure 3. BP controlled currently 4. 2HPTH 1. Add on Phos 2. Cont sevelamer 3200 qAC + PhosLo 1qAC 3. Cont VDRA w/ HD 5. Gangrenous L Foot 1. S/p L BKA 12/11 w/ VVS 6. CAD 7. DM2  Pearson Grippe MD 05/07/2014, 11:41 AM   Recent Labs Lab 05/04/14 1737  05/05/14 0348 05/06/14 0510 05/06/14 2100  NA 135*  --  134* 137  --   K 5.5*  --  5.9* 5.4*  --   CL 102  --  95* 95*  --   CO2  --   --  21 26  --   GLUCOSE 114*  --  93 85  --   BUN 66*  --  80* 51*  --   CREATININE 10.80*  < > 11.28* 8.07* 8.94*  CALCIUM  --   --  9.4 9.3  --   < > =  values in this interval not displayed.  Recent Labs Lab 05/04/14 1727  05/05/14 0348 05/06/14 0510 05/06/14 2100  WBC 12.2*  < > 10.2 9.0 11.2*  NEUTROABS 9.5*  --  7.3  --   --   HGB 11.8*  < > 11.3* 11.1* 10.3*  HCT 35.2*  < > 33.5* 33.3* 30.7*  MCV 90.0  < > 86.3 86.9 87.0  PLT 212  < > 206 203 222  < > = values in this interval not displayed.

## 2014-05-07 NOTE — Evaluation (Signed)
Physical Therapy Evaluation Patient Details Name: [name] MRN: [id number] DOB: 1955-08-05 Today's Date: 05/07/2014   History of Present Illness  [name] is a 58 y.o. male with history of ESRD on hemodialysis on Monday Wednesday and Friday, hypertension, hyperlipidemia, CAD status post stenting, diabetes mellitus type 2 was referred to the ER from the wound center after patient was found to have gangrenous foot on the left side. 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  Clinical Impression  Pt admitted with above diagnosis. Pt currently with functional limitations due to the deficits listed below (see PT Problem List). Attempted bed to chair transfer but pt too painful and anxious, achieved partial stand.  Pt will benefit from skilled PT to increase their independence and safety with mobility to allow discharge to the venue listed below.        Follow Up Recommendations CIR    Equipment Recommendations  Other (comment) (TBD)    Recommendations for Other Services OT consult;Rehab consult     Precautions / Restrictions Precautions Precautions: Fall Restrictions Weight Bearing Restrictions: No      Mobility  Bed Mobility Overal bed mobility: Needs Assistance;+2 for physical assistance Bed Mobility: Supine to Sit;Sit to Supine     Supine to sit: Mod assist;+2 for physical assistance Sit to supine: Max assist;+2 for physical assistance   General bed mobility comments: very slow mvmt due to pain, pt needed much encouragement  Transfers Overall transfer level: Needs assistance Equipment used: Rolling walker (2 wheeled) Transfers: Sit to/from Stand Sit to Stand: +2 physical assistance;Mod assist         General transfer comment: pt achieved partial stand but maintained only 30 sec before stating that pain was too great and needed to sit down. Therefore, did not attempt transfer to chair as originally planned  Ambulation/Gait             General Gait Details:  NT  Stairs            Wheelchair Mobility    Modified Rankin (Stroke Patients Only)       Balance Overall balance assessment: Needs assistance Sitting-balance support: Single extremity supported Sitting balance-Leahy Scale: Good     Standing balance support: Bilateral upper extremity supported Standing balance-Leahy Scale: Zero                               Pertinent Vitals/Pain Pain Assessment: Faces Faces Pain Scale: Hurts worst Pain Location: left leg Pain Intervention(s): Limited activity within patient's tolerance;Monitored during session;Premedicated before session;Repositioned  VSS    Home Living Family/patient expects to be discharged to:: Private residence Living Arrangements: Other relatives Available Help at Discharge: Family;Available PRN/intermittently Type of Home: House Home Access: Level entry     Home Layout: One level   Additional Comments: pt lives with sister    Prior Function Level of Independence: Independent         Comments: reports he was able to put very little wt on left side before surgery due to pain     Hand Dominance        Extremity/Trunk Assessment   Upper Extremity Assessment: Overall WFL for tasks assessed           Lower Extremity Assessment: RLE deficits/detail;LLE deficits/detail RLE Deficits / Details: nodules on knee that he reports are painful, he thinks they are shingles that are drying up LLE Deficits / Details: unable to lift leg against gravity and min  motion at knee due to pain  Cervical / Trunk Assessment: Normal  Communication   Communication: No difficulties  Cognition Arousal/Alertness: Awake/alert Behavior During Therapy: Anxious Overall Cognitive Status: Within Functional Limits for tasks assessed                      General Comments      Exercises Amputee Exercises Quad Sets: AROM;Left;10 reps;Supine Hip ABduction/ADduction: AAROM;Left;10  reps;Supine Straight Leg Raises: AAROM;Left;10 reps;Supine      Assessment/Plan    PT Assessment Patient needs continued PT services  PT Diagnosis Difficulty walking;Acute pain   PT Problem List Decreased strength;Decreased range of motion;Decreased activity tolerance;Decreased balance;Decreased mobility;Decreased knowledge of use of DME;Decreased safety awareness;Decreased knowledge of precautions;Pain  PT Treatment Interventions DME instruction;Gait training;Functional mobility training;Therapeutic activities;Therapeutic exercise;Balance training;Cognitive remediation;Neuromuscular re-education;Patient/family education   PT Goals (Current goals can be found in the Care Plan section) Acute Rehab PT Goals Patient Stated Goal: return home PT Goal Formulation: With patient Time For Goal Achievement: 05/21/14 Potential to Achieve Goals: Good    Frequency Min 4X/week   Barriers to discharge        Co-evaluation               End of Session Equipment Utilized During Treatment: Gait belt Activity Tolerance: Patient limited by pain Patient left: in bed;with call bell/phone within reach Nurse Communication: Mobility status         Time: 0931-0958 PT Time Calculation (min) (ACUTE ONLY): 27 min   Charges:   PT Evaluation $Initial PT Evaluation Tier I: 1 Procedure PT Treatments $Therapeutic Activity: 23-37 mins   PT G Codes:         Leighton Roach, PT  Acute Rehab Services  [phone]  Leighton Roach 05/07/2014, 10:42 AM

## 2014-05-07 NOTE — Progress Notes (Signed)
Paged Dr. Valeta Harms, pt has refused dialysis for today.

## 2014-05-08 LAB — GLUCOSE, CAPILLARY
GLUCOSE-CAPILLARY: 116 mg/dL — AB (ref 70–99)
GLUCOSE-CAPILLARY: 101 mg/dL — AB (ref 70–99)
Glucose-Capillary: 96 mg/dL (ref 70–99)
Glucose-Capillary: 99 mg/dL (ref 70–99)

## 2014-05-08 LAB — CBC
HEMATOCRIT: 29.9 % — AB (ref 39.0–52.0)
Hemoglobin: 10 g/dL — ABNORMAL LOW (ref 13.0–17.0)
MCH: 29.2 pg (ref 26.0–34.0)
MCHC: 33.4 g/dL (ref 30.0–36.0)
MCV: 87.2 fL (ref 78.0–100.0)
Platelets: 235 10*3/uL (ref 150–400)
RBC: 3.43 MIL/uL — AB (ref 4.22–5.81)
RDW: 17.1 % — ABNORMAL HIGH (ref 11.5–15.5)
WBC: 11.5 10*3/uL — AB (ref 4.0–10.5)

## 2014-05-08 LAB — BASIC METABOLIC PANEL
Anion gap: 15 (ref 5–15)
BUN: 45 mg/dL — ABNORMAL HIGH (ref 6–23)
CALCIUM: 8.9 mg/dL (ref 8.4–10.5)
CO2: 26 meq/L (ref 19–32)
Chloride: 94 mEq/L — ABNORMAL LOW (ref 96–112)
Creatinine, Ser: 7.35 mg/dL — ABNORMAL HIGH (ref 0.50–1.35)
GFR calc Af Amer: 8 mL/min — ABNORMAL LOW (ref >90)
GFR calc non Af Amer: 7 mL/min — ABNORMAL LOW (ref >90)
Glucose, Bld: 91 mg/dL (ref 70–99)
Potassium: 5.6 mEq/L — ABNORMAL HIGH (ref 3.7–5.3)
SODIUM: 135 meq/L — AB (ref 137–147)

## 2014-05-08 MED ORDER — DARBEPOETIN ALFA 40 MCG/0.4ML IJ SOSY: 40.0000 ug | PREFILLED_SYRINGE | INTRAMUSCULAR | Status: DC

## 2014-05-08 MED ORDER — SODIUM CHLORIDE 0.9 % IV SOLN
125.0000 mg | INTRAVENOUS | Status: DC
  Administered 2014-05-09: 125 mg via INTRAVENOUS
  Filled 2014-05-08 (×2): qty 10

## 2014-05-08 MED ORDER — SODIUM POLYSTYRENE SULFONATE 15 GM/60ML PO SUSP
30.0000 g | Freq: Once | ORAL | Status: AC
  Administered 2014-05-08: 30 g via ORAL

## 2014-05-08 NOTE — Progress Notes (Signed)
Admit: 05/04/2014 LOS: 4  20M ESRD ([facility]) MWF via [facility] AVG with gangrenous L foot s/p BKA 05/06/14  Subjective:  Doing well HD yesterday, 3L UF, post weight 78.5kg, normotensive Upset about pain meds Eating well, all of breakfast  12/12 0701 - 12/13 0700 In: 3 [I.V.:3] Out: 3012   [employer] [facility] Weights   05/07/14 1251 05/07/14 1635 05/07/14 2029  Weight: 80.5 kg (177 lb 7.5 oz) 78.5 kg (173 lb 1 oz) 77.8 kg (171 lb 8.3 oz)    Scheduled Meds: . aspirin  325 mg Oral Daily  . budesonide-formoterol  2 puff Inhalation BID  . calcium acetate  667 mg Oral TID WC  . darbepoetin (ARANESP) injection - DIALYSIS  40 mcg Intravenous Q Mon-HD  . docusate sodium  100 mg Oral Daily  . [START ON 05/09/2014] doxercalciferol  2.5 mcg Intravenous Q M,W,F-HD  . furosemide  80 mg Oral BID  . heparin  5,000 Units Subcutaneous 3 times per day  . insulin aspart  0-9 Units Subcutaneous TID WC  . metoprolol tartrate  25 mg Oral BID  . pantoprazole  40 mg Oral Daily  . sevelamer carbonate  3,200 mg Oral TID WC  . simvastatin  20 mg Oral QHS  . sodium bicarbonate  650 mg Oral Daily  . sodium chloride  3 mL Intravenous Q12H  . sodium polystyrene  30 g Oral Once  . tiotropium  18 mcg Inhalation Daily   Continuous Infusions:  PRN Meds:.acetaminophen **OR** acetaminophen, albuterol, guaiFENesin-dextromethorphan, hydrALAZINE, labetalol, metoprolol, morphine injection, ondansetron **OR** ondansetron (ZOFRAN) IV, phenol  Current Labs: reviewed    Physical Exam:  Blood pressure 129/89, pulse 75, temperature 99 F (37.2 C), temperature source Oral, resp. rate 19, height 6' 2.5" (1.892 m), weight 77.8 kg (171 lb 8.3 oz), SpO2 97 %. GEN: NAD ENT: NCAT EYES: EOMI CV: RRR, no rub. Nl s1s2 PULM: CTAB ABD: s/nt/nd SKIN: LLE bandaged EXT:No LEE, RUA with 2 large aneurysms, avf just lateral to these  Outpt HD Orders Unit: [facility] Days: MWF Time: 3h14min Dialyzer: [facility]ro Polyflux EDW:  83.0kg K/Ca: 2 / 2.5 Access: AVG Needle Size: 14g BFR/DFR: 500 / 800 UF Proflie: profile 2 VDRA: Hectorol 2.5qTx EPO: none IV Fe: none Heparin: 2000 bolus, and 600/hr Most Recent Phos / PTH: unk Most Recent TSAT / Ferritin: unk Most Recent eKT/V: unk Treatment Adherence: unk  A/P 1. ESRD 1. MWF [facility], RUA AVG 2. Now will be back on MWF schedule 3. Tight heparin 2. Anemia  1. Hb at goal 2. Not on maintenance Epo/Fe as outpt 3. Added low dose ESA 05/07/14 given recent surgery;  4. TSAT 12/12 15%, Ferritin 585 5. Begin IV Fe load 05/09/14; no evidence of infection 3. HTN/Vol 1. Cont Lasix, MTP 2. Trial EDW next Tx 77.5kg 3. BP controlled currently 4. 2HPTH 1. Add on Phos 2. Cont sevelamer 3200 qAC + PhosLo 1qAC 3. Cont VDRA w/ HD 5. Gangrenous L Foot 1. S/p L BKA 12/11 w/ VVS 6. CAD 7. DM2  Pearson Grippe MD 05/08/2014, 9:33 AM   Recent Labs Lab 05/06/14 0510 05/06/14 2100 05/07/14 1300 05/07/14 1654 05/08/14 0509  NA 137  --  136*  --  135*  K 5.4*  --  6.4* 4.3 5.6*  CL 95*  --  95*  --  94*  CO2 26  --  24  --  26  GLUCOSE 85  --  93  --  91  BUN 51*  --  67*  --  45*  CREATININE 8.07* 8.94* 9.76*  --  7.35*  CALCIUM 9.3  --  9.0  --  8.9  PHOS  --   --  7.5*  --   --     Recent Labs Lab 05/04/14 1727  05/05/14 0348  05/06/14 2100 05/07/14 1300 05/08/14 0509  WBC 12.2*  < > 10.2  < > 11.2* 10.7* 11.5*  NEUTROABS 9.5*  --  7.3  --   --   --   --   HGB 11.8*  < > 11.3*  < > 10.3* 9.8* 10.0*  HCT 35.2*  < > 33.5*  < > 30.7* 29.2* 29.9*  MCV 90.0  < > 86.3  < > 87.0 86.6 87.2  PLT 212  < > 206  < > 222 216 235  < > = values in this interval not displayed.

## 2014-05-08 NOTE — Progress Notes (Signed)
   VASCULAR SURGERY ASSESSMENT & PLAN:  * 2 Days Post-Op s/p: left below-the-knee amputation. Wound is healing adequately.  *  PTx / CIR  SUBJECTIVE: still with moderate discomfort.  PHYSICAL EXAM: Filed Vitals:   05/07/14 2029 05/07/14 2203 05/08/14 0503 05/08/14 0737  BP: 115/71  129/88 129/89  Pulse: 70  75 75  Temp: 98.6 F (37 C)  99 F (37.2 C) 99 F (37.2 C)  TempSrc:    Oral  Resp: 16  18 19   Height:      Weight: 171 lb 8.3 oz (77.8 kg)     SpO2: 98% 97% 97% 97%   I changed the dressing on his left BKA and this looks fine.  LABS: Lab Results  Component Value Date   WBC 11.5* 05/08/2014   HGB 10.0* 05/08/2014   HCT 29.9* 05/08/2014   MCV 87.2 05/08/2014   PLT 235 05/08/2014   Lab Results  Component Value Date   CREATININE 7.35* 05/08/2014   No results found for: INR, PROTIME CBG (last 3)   Recent Labs  05/07/14 1718 05/07/14 2028 05/08/14 0736  GLUCAP 94 99 96    Principal Problem:   Gangrene of foot Active Problems:   HYPERTENSION, BENIGN   ESRD (end stage renal disease) on dialysis   CAD (coronary artery disease)   Normocytic anemia   Gae Gallop Beeper: [id number] 05/08/2014

## 2014-05-08 NOTE — Progress Notes (Signed)
Utilization review complete. Nafis Farnan RN CCM Case Mgmt phone [phone] 

## 2014-05-08 NOTE — Progress Notes (Signed)
PATIENT DETAILS Name: [name] Age: 58 y.o. Sex: male Date of Birth: October 27, 1955 Admit Date: 05/04/2014 Admitting Physician Theressa Millard, MD [id number], NGWE A, MD  Subjective: No major issues overnight-pain at amputation site.  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   Active Problems:    End-stage renal disease: On hemodialysis MWF. Renal consulted.     History of asthma: Lungs currently clear, this appears stable. Continue Symbicort, Spiriva. Continue as needed albuterol nebulizer.      History of CAD-status post remote PCI : Currently asymptomatic, continue aspirin, statin and metoprolol.      Hypertension: Continue with metoprolol and Lasix.     Normocytic anemia: Secondary to chronic kidney disease, IV iron and erythropoietin per nephrology.      History of dyslipidemia: Continue statin     History of type 2 diabetes: Continue SSI, hemoglobin 6.2. Not on any oral hypoglycemic agents/insulin as an outpatient.  Disposition: Remain inpatient  Antibiotics:  See below   Anti-infectives    Start     Dose/Rate Route Frequency Ordered Stop   05/07/14 1200  vancomycin (VANCOCIN) IVPB 1000 mg/200 mL premix    Comments:  Discontinue after dose on 12/12   1,000 mg200 mL/hr over 60 Minutes Intravenous Every Sat (Hemodialysis) 05/06/14 2029 05/07/14 1300   05/07/14 0000  piperacillin-tazobactam (ZOSYN) IVPB 2.25 g     2.25 g100 mL/hr over 30 Minutes Intravenous Every 8 hours 05/06/14 2029 05/07/14 0843   05/06/14 1200  vancomycin (VANCOCIN) IVPB 1000 mg/200 mL premix  Status:  Discontinued     1,000 mg200 mL/hr over 60 Minutes Intravenous Every M-W-F (Hemodialysis) 05/04/14 2236 05/06/14 2029   05/05/14 1200  vancomycin (VANCOCIN) IVPB  1000 mg/200 mL premix     1,000 mg200 mL/hr over 60 Minutes Intravenous Every Thu (Hemodialysis) 05/04/14 2236 05/05/14 1621   05/04/14 2300  piperacillin-tazobactam (ZOSYN) IVPB 2.25 g  Status:  Discontinued     2.25 g100 mL/hr over 30 Minutes Intravenous Every 8 hours 05/04/14 2233 05/06/14 2029   05/04/14 2300  vancomycin (VANCOCIN) 2,000 mg in sodium chloride 0.9 % 500 mL IVPB     2,000 mg250 mL/hr over 120 Minutes Intravenous  Once 05/04/14 2236 05/05/14 0243      DVT Prophylaxis: Prophylactic  Heparin   Code Status: Full code   Family Communication None at bedside  Procedures:  Left BKA-05/06/14  CONSULTS:  nephrology and vascular surgery  MEDICATIONS: Scheduled Meds: . aspirin  325 mg Oral Daily  . budesonide-formoterol  2 puff Inhalation BID  . calcium acetate  667 mg Oral TID WC  . [START ON 05/16/2014] darbepoetin (ARANESP) injection - DIALYSIS  40 mcg Intravenous Q Mon-HD  . docusate sodium  100 mg Oral Daily  . [START ON 05/09/2014] doxercalciferol  2.5 mcg Intravenous Q M,W,F-HD  . [START ON 05/09/2014] ferric gluconate (FERRLECIT/NULECIT) IV  125 mg Intravenous Q M,W,F-HD  . furosemide  80 mg Oral BID  . heparin  5,000 Units Subcutaneous 3 times per day  . insulin aspart  0-9 Units Subcutaneous TID WC  . metoprolol tartrate  25 mg Oral BID  . pantoprazole  40 mg Oral Daily  . sevelamer carbonate  3,200 mg Oral TID WC  . simvastatin  20 mg Oral QHS  . sodium  bicarbonate  650 mg Oral Daily  . sodium chloride  3 mL Intravenous Q12H  . sodium polystyrene  30 g Oral Once  . tiotropium  18 mcg Inhalation Daily   Continuous Infusions:  PRN Meds:.acetaminophen **OR** acetaminophen, albuterol, guaiFENesin-dextromethorphan, hydrALAZINE, labetalol, metoprolol, morphine injection, ondansetron **OR** ondansetron (ZOFRAN) IV, phenol    PHYSICAL EXAM: Vital signs in last 24 hours: Filed Vitals:   05/07/14 2029 05/07/14 2203 05/08/14 0503 05/08/14 0737  BP: 115/71   129/88 129/89  Pulse: 70  75 75  Temp: 98.6 F (37 C)  99 F (37.2 C) 99 F (37.2 C)  TempSrc:    Oral  Resp: 16  18 19   Height:      Weight: 77.8 kg (171 lb 8.3 oz)     SpO2: 98% 97% 97% 97%    Weight change: -2.801 kg (-6 lb 2.8 oz) Filed Weights   05/07/14 1251 05/07/14 1635 05/07/14 2029  Weight: 80.5 kg (177 lb 7.5 oz) 78.5 kg (173 lb 1 oz) 77.8 kg (171 lb 8.3 oz)   Body mass index is 21.73 kg/(m^2).   Gen Exam: Awake and alert with clear speech.  Neck: Supple, No JVD.   Chest: B/L Clear.   CVS: S1 S2 Regular, no murmurs.  Abdomen: soft, BS +, non tender, non distended.  Extremities: no edema-s/p left bka-dry dressing Neurologic: Non Focal.  Skin: No Rash.   Wounds: N/A.    Intake/Output from previous day:  Intake/Output Summary (Last 24 hours) at 05/08/14 1316 Last data filed at 05/08/14 0700  Gross per 24 hour  Intake      3 ml  Output   3012 ml  Net  -3009 ml     LAB RESULTS: CBC  Recent Labs Lab 05/04/14 1727  05/05/14 0348 05/06/14 0510 05/06/14 2100 05/07/14 1300 05/08/14 0509  WBC 12.2*  < > 10.2 9.0 11.2* 10.7* 11.5*  HGB 11.8*  < > 11.3* 11.1* 10.3* 9.8* 10.0*  HCT 35.2*  < > 33.5* 33.3* 30.7* 29.2* 29.9*  PLT 212  < > 206 203 222 216 235  MCV 90.0  < > 86.3 86.9 87.0 86.6 87.2  MCH 30.2  < > 29.1 29.0 29.2 29.1 29.2  MCHC 33.5  < > 33.7 33.3 33.6 33.6 33.4  RDW 17.2*  < > 16.7* 16.9* 17.0* 16.7* 17.1*  LYMPHSABS 1.4  --  1.5  --   --   --   --   MONOABS 1.1*  --  1.1*  --   --   --   --   EOSABS 0.2  --  0.3  --   --   --   --   BASOSABS 0.0  --  0.1  --   --   --   --   < > = values in this interval not displayed.  Chemistries   Recent Labs Lab 05/04/14 1737  05/05/14 0348 05/06/14 0510 05/06/14 2100 05/07/14 1300 05/07/14 1654 05/08/14 0509  NA 135*  --  134* 137  --  136*  --  135*  K 5.5*  --  5.9* 5.4*  --  6.4* 4.3 5.6*  CL 102  --  95* 95*  --  95*  --  94*  CO2  --   --  21 26  --  24  --  26  GLUCOSE 114*  --   93 85  --  93  --  91  BUN 66*  --  80* 51*  --  67*  --  45*  CREATININE 10.80*  < > 11.28* 8.07* 8.94* 9.76*  --  7.35*  CALCIUM  --   --  9.4 9.3  --  9.0  --  8.9  < > = values in this interval not displayed.  CBG:  Recent Labs Lab 05/07/14 1137 05/07/14 1718 05/07/14 2028 05/08/14 0736 05/08/14 1210  GLUCAP 122* 94 99 96 116*    GFR Estimated Creatinine Clearance: 11.8 mL/min (by C-G formula based on Cr of 7.35).  Coagulation profile No results for input(s): INR, PROTIME in the last 168 hours.  Cardiac Enzymes No results for input(s): CKMB, TROPONINI, MYOGLOBIN in the last 168 hours.  Invalid input(s): CK  Invalid input(s): POCBNP No results for input(s): DDIMER in the last 72 hours. No results for input(s): HGBA1C in the last 72 hours. No results for input(s): CHOL, HDL, LDLCALC, TRIG, CHOLHDL, LDLDIRECT in the last 72 hours. No results for input(s): TSH, T4TOTAL, T3FREE, THYROIDAB in the last 72 hours.  Invalid input(s): FREET3  Recent Labs  05/07/14 1324  FERRITIN 586*  TIBC 179*  IRON 26*   No results for input(s): LIPASE, AMYLASE in the last 72 hours.  Urine Studies No results for input(s): UHGB, CRYS in the last 72 hours.  Invalid input(s): UACOL, UAPR, USPG, UPH, UTP, UGL, UKET, UBIL, UNIT, UROB, ULEU, UEPI, UWBC, URBC, UBAC, CAST, UCOM, BILUA  MICROBIOLOGY: Recent Results (from the past 240 hour(s))  MRSA PCR Screening     Status: None   Collection Time: 05/05/14  1:05 AM  Result Value Ref Range Status   MRSA by PCR NEGATIVE NEGATIVE Final    Comment:        The GeneXpert MRSA Assay (FDA approved for NASAL specimens only), is one component of a comprehensive MRSA colonization surveillance program. It is not intended to diagnose MRSA infection nor to guide or monitor treatment for MRSA infections.   Surgical pcr screen     Status: None   Collection Time: 05/05/14 10:30 PM  Result Value Ref Range Status   MRSA, PCR NEGATIVE NEGATIVE  Final   Staphylococcus aureus NEGATIVE NEGATIVE Final    Comment:        The Xpert SA Assay (FDA approved for NASAL specimens in patients over 52 years of age), is one component of a comprehensive surveillance program.  Test performance has been validated by [employer] for patients greater than or equal to 71 year old. It is not intended to diagnose infection nor to guide or monitor treatment.     RADIOLOGY STUDIES/RESULTS: Dg Chest 2 View  05/04/2014   CLINICAL DATA:  Gangrene of the toes on the left foot.  EXAM: CHEST  2 VIEW  COMPARISON:  03/12/2014  FINDINGS: There is chronic cardiomegaly. Pulmonary vascularity is normal. There is a persistent moderate right pleural effusion, slightly diminished since the prior exam. Left lung is clear.  No acute osseous abnormality.  IMPRESSION: Slight decrease in moderate right pleural effusion.   Electronically Signed   By: Rozetta Nunnery M.D.   On: 05/04/2014 18:08   Dg Foot Complete Left  05/04/2014   CLINICAL DATA:  Subsequent encounter for diabetes with gangrene on left toes.  EXAM: LEFT FOOT - COMPLETE 3+ VIEW  COMPARISON:  04/12/2014.  FINDINGS: No evidence of fracture. No subluxation or dislocation. There is some lucency over the second toe in tip of the great toe suggesting soft tissue disease. No underlying osteomyelitis or destruction of bony anatomy is evident.  IMPRESSION: Stable. Soft tissue defects in the first and second toes without evidence for underlying bony destruction.   Electronically Signed   By: Misty Stanley M.D.   On: 05/04/2014 17:51    Oren Binet, MD  [facility] Pager:[phone]  If 7PM-7AM, please contact night-coverage [email or web address] Password [email or web address] 05/08/2014, 1:16 PM   LOS: 4 days

## 2014-05-09 ENCOUNTER — Encounter (HOSPITAL_COMMUNITY): Payer: Self-pay | Admitting: Vascular Surgery

## 2014-05-09 DIAGNOSIS — I2510 Atherosclerotic heart disease of native coronary artery without angina pectoris: Secondary | ICD-10-CM

## 2014-05-09 DIAGNOSIS — Z89512 Acquired absence of left leg below knee: Secondary | ICD-10-CM

## 2014-05-09 LAB — GLUCOSE, CAPILLARY
GLUCOSE-CAPILLARY: 153 mg/dL — AB (ref 70–99)
Glucose-Capillary: 83 mg/dL (ref 70–99)
Glucose-Capillary: 83 mg/dL (ref 70–99)

## 2014-05-09 LAB — CBC
HCT: 29.9 % — ABNORMAL LOW (ref 39.0–52.0)
Hemoglobin: 10.1 g/dL — ABNORMAL LOW (ref 13.0–17.0)
MCH: 30.1 pg (ref 26.0–34.0)
MCHC: 33.8 g/dL (ref 30.0–36.0)
MCV: 89 fL (ref 78.0–100.0)
PLATELETS: 271 10*3/uL (ref 150–400)
RBC: 3.36 MIL/uL — ABNORMAL LOW (ref 4.22–5.81)
RDW: 17.1 % — ABNORMAL HIGH (ref 11.5–15.5)
WBC: 9.9 10*3/uL (ref 4.0–10.5)

## 2014-05-09 LAB — RENAL FUNCTION PANEL
Albumin: 2 g/dL — ABNORMAL LOW (ref 3.5–5.2)
Anion gap: 16 — ABNORMAL HIGH (ref 5–15)
BUN: 65 mg/dL — ABNORMAL HIGH (ref 6–23)
CALCIUM: 9.2 mg/dL (ref 8.4–10.5)
CO2: 23 mEq/L (ref 19–32)
Chloride: 93 mEq/L — ABNORMAL LOW (ref 96–112)
Creatinine, Ser: 9.36 mg/dL — ABNORMAL HIGH (ref 0.50–1.35)
GFR, EST AFRICAN AMERICAN: 6 mL/min — AB (ref >90)
GFR, EST NON AFRICAN AMERICAN: 5 mL/min — AB (ref >90)
Glucose, Bld: 86 mg/dL (ref 70–99)
PHOSPHORUS: 7.1 mg/dL — AB (ref 2.3–4.6)
Potassium: 6.1 mEq/L — ABNORMAL HIGH (ref 3.7–5.3)
Sodium: 132 mEq/L — ABNORMAL LOW (ref 137–147)

## 2014-05-09 MED ORDER — RENA-VITE PO TABS
1.0000 | ORAL_TABLET | Freq: Every day | ORAL | Status: DC
  Administered 2014-05-09: 1 via ORAL
  Filled 2014-05-09 (×2): qty 1

## 2014-05-09 MED ORDER — DOXERCALCIFEROL 4 MCG/2ML IV SOLN
INTRAVENOUS | Status: AC
  Filled 2014-05-09: qty 2

## 2014-05-09 MED ORDER — HEPARIN SODIUM (PORCINE) 1000 UNIT/ML DIALYSIS: 20.0000 [IU]/kg | INTRAMUSCULAR | Status: DC | PRN

## 2014-05-09 MED ORDER — NICOTINE 21 MG/24HR TD PT24
21.0000 mg | MEDICATED_PATCH | Freq: Every day | TRANSDERMAL | Status: DC
  Administered 2014-05-09 – 2014-05-10 (×2): 21 mg via TRANSDERMAL
  Filled 2014-05-09 (×2): qty 1

## 2014-05-09 NOTE — Evaluation (Signed)
Occupational Therapy Evaluation Patient Details Name: [name] MRN: [id number] DOB: [birth year] Today's Date: 05/09/2014    History of Present Illness [name] is a 58 y.o. male with history of ESRD on hemodialysis on Monday Wednesday and Friday, hypertension, hyperlipidemia, CAD status post stenting, diabetes mellitus type 2 was referred to the ER from the wound center after patient was found to have gangrenous foot on the left side. 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   Clinical Impression   Prior to admission, pt was independent with self care.  Pt presents with anxiety and pain.  He requires encouragement for OOB activity and moves extremely slowly.  He responds well to being in control of movement and given physical support of his L LE to assist with managing pain. Pt performed a lateral transfers to the chair.  He was able to perform UB ADL and grooming with min assist to set up.  He is +2 dependent at bed level for LB ADL.  Will follow.    Follow Up Recommendations  CIR    Equipment Recommendations  3 in 1 bedside comode (may require drop arm)    Recommendations for Other Services       Precautions / Restrictions Precautions Precautions: Fall Restrictions Weight Bearing Restrictions: No      Mobility Bed Mobility   Bed Mobility: Supine to Sit     Supine to sit: +2 for physical assistance;Min assist;HOB elevated     General bed mobility comments: very slow mvmt due to pain, pt needed much encouragement  Transfers Overall transfer level: Needs assistance   Transfers: Lateral/Scoot Transfers          Lateral/Scoot Transfers: +2 physical assistance;Min assist General transfer comment: Pt declined attempt to stand with RW, opted for lateral transfer.    Balance Overall balance assessment: Needs assistance   Sitting balance-Leahy Scale: Good                                      ADL Overall ADL's : Needs  assistance/impaired Eating/Feeding: Independent;Sitting   Grooming: Oral care;Wash/dry face;Wash/dry hands;Set up;Sitting   Upper Body Bathing: Minimal assitance;Sitting   Lower Body Bathing: +2 for safety/equipment;Maximal assistance;Bed level   Upper Body Dressing : Set up;Sitting   Lower Body Dressing: +2 for physical assistance;Maximal assistance;Bed level   Toilet Transfer: +2 for physical assistance;Minimal assistance;Cueing for sequencing;Cueing for safety (simulated from bed to drop arm chair scooting) Toilet Transfer Details (indicate cue type and reason): assisted to hold L LE for pain as pt used his UEs to scoot Toileting- Clothing Manipulation and Hygiene: +2 for physical assistance;Maximal assistance;Bed level       Functional mobility during ADLs: +2 for physical assistance;Minimal assistance (scooting transfer)       Vision                     Perception     Praxis      Pertinent Vitals/Pain Pain Assessment: Faces Faces Pain Scale: Hurts worst Pain Location: L LE Pain Intervention(s): Limited activity within patient's tolerance;Monitored during session;Repositioned;Patient requesting pain meds-RN notified;Relaxation     Hand Dominance Right   Extremity/Trunk Assessment Upper Extremity Assessment Upper Extremity Assessment: Overall WFL for tasks assessed   Lower Extremity Assessment Lower Extremity Assessment: Defer to PT evaluation       Communication Communication Communication: No difficulties   Cognition Arousal/Alertness: Awake/alert Behavior During Therapy: Anxious  Overall Cognitive Status: Within Functional Limits for tasks assessed                     General Comments       Exercises       Shoulder Instructions      Home Living Family/patient expects to be discharged to:: Private residence Living Arrangements: Other relatives Available Help at Discharge: Family;Available PRN/intermittently Type of Home:  House Home Access: Level entry     Home Layout: One level     Bathroom Shower/Tub: Teacher, early years/pre: Standard         Additional Comments: pt lives with sister      Prior Functioning/Environment Level of Independence: Independent        Comments: reports he was able to put very little wt on left side before surgery due to pain    OT Diagnosis: Generalized weakness;Acute pain   OT Problem List: Decreased strength;Decreased activity tolerance;Impaired balance (sitting and/or standing);Decreased knowledge of use of DME or AE;Decreased safety awareness;Pain   OT Treatment/Interventions: Self-care/ADL training;DME and/or AE instruction;Therapeutic activities;Patient/family education;Balance training    OT Goals(Current goals can be found in the care plan section) Acute Rehab OT Goals Patient Stated Goal: return home OT Goal Formulation: With patient Time For Goal Achievement: 05/23/14 Potential to Achieve Goals: Good  OT Frequency: Min 2X/week   Barriers to D/C:            Co-evaluation PT/OT/SLP Co-Evaluation/Treatment: Yes Reason for Co-Treatment: For patient/therapist safety;Necessary to address cognition/behavior during functional activity   OT goals addressed during session: ADL's and self-care      End of Session Equipment Utilized During Treatment: Gait belt Nurse Communication: Patient requests pain meds;Mobility status  Activity Tolerance: Patient limited by pain Patient left: in chair;with call bell/phone within reach   Time: 0917-1008 OT Time Calculation (min): 51 min Charges:  OT General Charges $OT Visit: 1 Procedure OT Evaluation $Initial OT Evaluation Tier I: 1 Procedure OT Treatments $Self Care/Home Management : 8-22 mins G-Codes:    Malka So 05/09/2014, 10:46 AM [phone]

## 2014-05-09 NOTE — Progress Notes (Signed)
PATIENT DETAILS Name: [name] Age: 58 y.o. Sex: male Date of Birth: 1955-06-18 Admit Date: 05/04/2014 Admitting Physician Theressa Millard, MD [id number], NGWE A, MD  Subjective: No major issues overnight-continues to have some pain at amputation site.  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 Awaiting CIR bed-ok for transfer if bed available.   Active Problems:    End-stage renal disease: On hemodialysis MWF. Renal consulted and following     History of asthma: Lungs currently clear, this appears stable. Continue Symbicort, Spiriva. Continue as needed albuterol nebulizer.      History of CAD-status post remote PCI : Currently asymptomatic, continue aspirin, statin and metoprolol.      Hypertension: Continue with metoprolol and Lasix.     Normocytic anemia: Secondary to chronic kidney disease, IV iron and erythropoietin per nephrology.      History of dyslipidemia: Continue statin     History of type 2 diabetes: Continue SSI, hemoglobin 6.2. Not on any oral hypoglycemic agents/insulin as an outpatient.  Disposition: Remain inpatient-CIR if bed available, otherwise may need SNF.  Antibiotics:  See below   Anti-infectives    Start     Dose/Rate Route Frequency Ordered Stop   05/07/14 1200  vancomycin (VANCOCIN) IVPB 1000 mg/200 mL premix    Comments:  Discontinue after dose on 12/12   1,000 mg200 mL/hr over 60 Minutes Intravenous Every Sat (Hemodialysis) 05/06/14 2029 05/07/14 1300   05/07/14 0000  piperacillin-tazobactam (ZOSYN) IVPB 2.25 g     2.25 g100 mL/hr over 30 Minutes Intravenous Every 8 hours 05/06/14 2029 05/07/14 0843   05/06/14 1200  vancomycin (VANCOCIN) IVPB 1000 mg/200 mL premix  Status:  Discontinued     1,000 mg200  mL/hr over 60 Minutes Intravenous Every M-W-F (Hemodialysis) 05/04/14 2236 05/06/14 2029   05/05/14 1200  vancomycin (VANCOCIN) IVPB 1000 mg/200 mL premix     1,000 mg200 mL/hr over 60 Minutes Intravenous Every Thu (Hemodialysis) 05/04/14 2236 05/05/14 1621   05/04/14 2300  piperacillin-tazobactam (ZOSYN) IVPB 2.25 g  Status:  Discontinued     2.25 g100 mL/hr over 30 Minutes Intravenous Every 8 hours 05/04/14 2233 05/06/14 2029   05/04/14 2300  vancomycin (VANCOCIN) 2,000 mg in sodium chloride 0.9 % 500 mL IVPB     2,000 mg250 mL/hr over 120 Minutes Intravenous  Once 05/04/14 2236 05/05/14 0243      DVT Prophylaxis: Prophylactic  Heparin   Code Status: Full code   Family Communication None at bedside  Procedures:  Left BKA-05/06/14  CONSULTS:  nephrology and vascular surgery. CIR  MEDICATIONS: Scheduled Meds: . aspirin  325 mg Oral Daily  . budesonide-formoterol  2 puff Inhalation BID  . calcium acetate  667 mg Oral TID WC  . [START ON 05/16/2014] darbepoetin (ARANESP) injection - DIALYSIS  40 mcg Intravenous Q Mon-HD  . docusate sodium  100 mg Oral Daily  . doxercalciferol  2.5 mcg Intravenous Q M,W,F-HD  . ferric gluconate (FERRLECIT/NULECIT) IV  125 mg Intravenous Q M,W,F-HD  . heparin  5,000 Units Subcutaneous 3 times per day  . insulin aspart  0-9 Units Subcutaneous TID WC  . metoprolol tartrate  25 mg Oral BID  . multivitamin  1 tablet Oral QHS  . pantoprazole  40 mg Oral Daily  . sevelamer carbonate  3,200 mg Oral TID WC  . simvastatin  20 mg Oral QHS  . sodium chloride  3 mL Intravenous Q12H  . sodium polystyrene  30 g Oral Once  . tiotropium  18 mcg Inhalation Daily   Continuous Infusions:  PRN Meds:.acetaminophen **OR** acetaminophen, albuterol, guaiFENesin-dextromethorphan, hydrALAZINE, labetalol, metoprolol, morphine injection, ondansetron **OR** ondansetron (ZOFRAN) IV, phenol    PHYSICAL EXAM: Vital signs in last 24 hours: Filed Vitals:   05/08/14  2109 05/09/14 0516 05/09/14 0913 05/09/14 0930  BP: 116/65 120/70  118/52  Pulse: 67 67 70 67  Temp: 98.2 F (36.8 C) 98.4 F (36.9 C)  98.4 F (36.9 C)  TempSrc:    Oral  Resp: 18 17 18 18   Height:      Weight: 76 kg (167 lb 8.8 oz)     SpO2: 98% 100% 99% 99%    Weight change: -4.5 kg (-9 lb 14.7 oz) Filed Weights   05/07/14 1635 05/07/14 2029 05/08/14 2109  Weight: 78.5 kg (173 lb 1 oz) 77.8 kg (171 lb 8.3 oz) 76 kg (167 lb 8.8 oz)   Body mass index is 21.23 kg/(m^2).   Gen Exam: Awake and alert with clear speech.  Neck: Supple, No JVD.   Chest: B/L Clear.  No rhonchi CVS: S1 S2 Regular, no murmurs.  Abdomen: soft, BS +, non tender, non distended.  Extremities: no edema-s/p left bka-dry dressing Neurologic: Non Focal.  Skin: No Rash.   Wounds: N/A.    Intake/Output from previous day:  Intake/Output Summary (Last 24 hours) at 05/09/14 1031 Last data filed at 05/09/14 0900  Gross per 24 hour  Intake    480 ml  Output      0 ml  Net    480 ml     LAB RESULTS: CBC  Recent Labs Lab 05/04/14 1727  05/05/14 0348 05/06/14 0510 05/06/14 2100 05/07/14 1300 05/08/14 0509  WBC 12.2*  < > 10.2 9.0 11.2* 10.7* 11.5*  HGB 11.8*  < > 11.3* 11.1* 10.3* 9.8* 10.0*  HCT 35.2*  < > 33.5* 33.3* 30.7* 29.2* 29.9*  PLT 212  < > 206 203 222 216 235  MCV 90.0  < > 86.3 86.9 87.0 86.6 87.2  MCH 30.2  < > 29.1 29.0 29.2 29.1 29.2  MCHC 33.5  < > 33.7 33.3 33.6 33.6 33.4  RDW 17.2*  < > 16.7* 16.9* 17.0* 16.7* 17.1*  LYMPHSABS 1.4  --  1.5  --   --   --   --   MONOABS 1.1*  --  1.1*  --   --   --   --   EOSABS 0.2  --  0.3  --   --   --   --   BASOSABS 0.0  --  0.1  --   --   --   --   < > = values in this interval not displayed.  Chemistries   Recent Labs Lab 05/04/14 1737  05/05/14 0348 05/06/14 0510 05/06/14 2100 05/07/14 1300 05/07/14 1654 05/08/14 0509  NA 135*  --  134* 137  --  136*  --  135*  K 5.5*  --  5.9* 5.4*  --  6.4* 4.3 5.6*  CL 102  --  95* 95*   --  95*  --  94*  CO2  --   --  21 26  --  24  --  26  GLUCOSE 114*  --  93 85  --  93  --  91  BUN 66*  --  80* 51*  --  67*  --  45*  CREATININE 10.80*  < > 11.28* 8.07* 8.94* 9.76*  --  7.35*  CALCIUM  --   --  9.4 9.3  --  9.0  --  8.9  < > = values in this interval not displayed.  CBG:  Recent Labs Lab 05/08/14 0736 05/08/14 1210 05/08/14 1644 05/08/14 2108 05/09/14 0756  GLUCAP 96 116* 99 101* 153*    GFR Estimated Creatinine Clearance: 11.5 mL/min (by C-G formula based on Cr of 7.35).  Coagulation profile No results for input(s): INR, PROTIME in the last 168 hours.  Cardiac Enzymes No results for input(s): CKMB, TROPONINI, MYOGLOBIN in the last 168 hours.  Invalid input(s): CK  Invalid input(s): POCBNP No results for input(s): DDIMER in the last 72 hours. No results for input(s): HGBA1C in the last 72 hours. No results for input(s): CHOL, HDL, LDLCALC, TRIG, CHOLHDL, LDLDIRECT in the last 72 hours. No results for input(s): TSH, T4TOTAL, T3FREE, THYROIDAB in the last 72 hours.  Invalid input(s): FREET3  Recent Labs  05/07/14 1324  FERRITIN 586*  TIBC 179*  IRON 26*   No results for input(s): LIPASE, AMYLASE in the last 72 hours.  Urine Studies No results for input(s): UHGB, CRYS in the last 72 hours.  Invalid input(s): UACOL, UAPR, USPG, UPH, UTP, UGL, UKET, UBIL, UNIT, UROB, ULEU, UEPI, UWBC, URBC, UBAC, CAST, UCOM, BILUA  MICROBIOLOGY: Recent Results (from the past 240 hour(s))  MRSA PCR Screening     Status: None   Collection Time: 05/05/14  1:05 AM  Result Value Ref Range Status   MRSA by PCR NEGATIVE NEGATIVE Final    Comment:        The GeneXpert MRSA Assay (FDA approved for NASAL specimens only), is one component of a comprehensive MRSA colonization surveillance program. It is not intended to diagnose MRSA infection nor to guide or monitor treatment for MRSA infections.   Surgical pcr screen     Status: None   Collection Time:  05/05/14 10:30 PM  Result Value Ref Range Status   MRSA, PCR NEGATIVE NEGATIVE Final   Staphylococcus aureus NEGATIVE NEGATIVE Final    Comment:        The Xpert SA Assay (FDA approved for NASAL specimens in patients over 85 years of age), is one component of a comprehensive surveillance program.  Test performance has been validated by [employer] for patients greater than or equal to 85 year old. It is not intended to diagnose infection nor to guide or monitor treatment.     RADIOLOGY STUDIES/RESULTS: Dg Chest 2 View  05/04/2014   CLINICAL DATA:  Gangrene of the toes on the left foot.  EXAM: CHEST  2 VIEW  COMPARISON:  03/12/2014  FINDINGS: There is chronic cardiomegaly. Pulmonary vascularity is normal. There is a persistent moderate right pleural effusion, slightly diminished since the prior exam. Left lung is clear.  No acute osseous abnormality.  IMPRESSION: Slight decrease in moderate right pleural effusion.   Electronically Signed   By: Rozetta Nunnery M.D.   On: 05/04/2014 18:08   Dg Foot Complete Left  05/04/2014   CLINICAL DATA:  Subsequent encounter for diabetes with gangrene on left toes.  EXAM: LEFT FOOT - COMPLETE 3+ VIEW  COMPARISON:  04/12/2014.  FINDINGS: No evidence of fracture. No subluxation or dislocation. There is some lucency over the second toe in tip of the great toe suggesting soft tissue  disease. No underlying osteomyelitis or destruction of bony anatomy is evident.  IMPRESSION: Stable. Soft tissue defects in the first and second toes without evidence for underlying bony destruction.   Electronically Signed   By: Misty Stanley M.D.   On: 05/04/2014 17:51    Oren Binet, MD  [facility] Pager:[phone]  If 7PM-7AM, please contact night-coverage [email or web address] Password [email or web address] 05/09/2014, 10:31 AM   LOS: 5 days

## 2014-05-09 NOTE — Progress Notes (Signed)
   [facility]  Subjective  - Complains of pain in the stump and thigh with movement.   Objective 118/52 67 98.4 F (36.9 C) (Oral) 18 99%  Intake/Output Summary (Last 24 hours) at 05/09/14 1021 Last data filed at 05/09/14 0900  Gross per 24 hour  Intake    480 ml  Output      0 ml  Net    480 ml    Left BKA stump is healing well Clean dry dressing applied.  Assessment/Planning: S/P BKA POD#3  Stable amputation site.  Pending CIR.  Laurence Slate [facility] 05/09/2014 10:21 AM --  Laboratory Lab Results:  Recent Labs  05/07/14 1300 05/08/14 0509  WBC 10.7* 11.5*  HGB 9.8* 10.0*  HCT 29.2* 29.9*  PLT 216 235   BMET  Recent Labs  05/07/14 1300 05/07/14 1654 05/08/14 0509  NA 136*  --  135*  K 6.4* 4.3 5.6*  CL 95*  --  94*  CO2 24  --  26  GLUCOSE 93  --  91  BUN 67*  --  45*  CREATININE 9.76*  --  7.35*  CALCIUM 9.0  --  8.9    COAG No results found for: INR, PROTIME No results found for: PTT

## 2014-05-09 NOTE — Progress Notes (Signed)
Patient ID: [name], male   DOB: [birth year], 58 y.o.   MRN: [id number] Vascular Surgery Progress Note  Subjective: 3 days post left BKA. Patient states pain level much less.  Objective:  Filed Vitals:   05/09/14 0516  BP: 120/70  Pulse: 67  Temp: 98.4 F (36.9 C)  Resp: 17    Left BKA wound checked by Dr. Doren Custard yesterday and healing nicely. Dressing not removed today.   Labs:  Recent Labs Lab 05/06/14 2100 05/07/14 1300 05/08/14 0509  CREATININE 8.94* 9.76* 7.35*    Recent Labs Lab 05/06/14 0510 05/06/14 2100 05/07/14 1300 05/07/14 1654 05/08/14 0509  NA 137  --  136*  --  135*  K 5.4*  --  6.4* 4.3 5.6*  CL 95*  --  95*  --  94*  CO2 26  --  24  --  26  BUN 51*  --  67*  --  45*  CREATININE 8.07* 8.94* 9.76*  --  7.35*  GLUCOSE 85  --  93  --  91  CALCIUM 9.3  --  9.0  --  8.9    Recent Labs Lab 05/06/14 2100 05/07/14 1300 05/08/14 0509  WBC 11.2* 10.7* 11.5*  HGB 10.3* 9.8* 10.0*  HCT 30.7* 29.2* 29.9*  PLT 222 216 235   No results for input(s): INR in the last 168 hours.  I/O last 3 completed shifts: In: 243 [P.O.:240; I.V.:3] Out: 0   Imaging: No results found.  Assessment/Plan:  POD #3   LOS: 5 days  s/p Procedure(s): AMPUTATION BELOW KNEE  Doing well 3 days post left below-knee amputation for severe ischemia and gangrene left foot Will continue to follow patient with you   Tinnie Gens, MD 05/09/2014 8:58 AM

## 2014-05-09 NOTE — Progress Notes (Signed)
I met with pt and two sisters to discuss rehab options. Pt and Terrence Dupont are leaning towards SNF rehab. They will continue to discuss with family today. I have alerted SW to pursue SNF rehab. I will follow up with pt later today for final dispo. [phone]

## 2014-05-09 NOTE — Progress Notes (Addendum)
I met with pt again in hemodialysis. He is non commital to his choice for rehab. I have requested SW to present pt with SNF bed offers to push pt to make choice for rehab venue. I will follow up in the morning. [phone] I received a call from pt's sisters this evening. They say sister's home is not wheelchair accessible and that pt needs to go to a rest home. I have told them they need to discuss with their brother, the pt, for it will be his decision. I have informed the SW of need for SNF placement if pt will agree. [phone]

## 2014-05-09 NOTE — Consult Note (Signed)
Physical Medicine and Rehabilitation Consult Reason for Consult: Left BKA Referring Physician: [facility]   HPI: [name] is a 58 y.o. right hand male with history of end-stage renal disease with hemodialysis, hypertension, CAD with stenting and diabetes mellitus with peripheral neuropathy. Patient lived alone independently with a cane prior to admission. Presented 05/04/2014 with nonhealing wound left foot and has been followed at the wound center. Noted ongoing gangrenous changes. X-rays of left foot showed lucency over the great left toe suggesting soft tissue disease. Limb was not felt to be salvageable and underwent left BKA 05/06/2014 per Dr. Oneida Alar. Hospital course pain management. Hemodialysis ongoing as per renal services. Acute on chronic anemia with latest hemoglobin 10. subcutaneous heparin for DVT prophylaxis. Physical therapy evaluation completed 05/07/2014 with recommendations of physical medicine rehabilitation consult.   Review of Systems  Gastrointestinal: Positive for constipation.  Musculoskeletal: Positive for myalgias.  Neurological: Positive for dizziness and weakness.  All other systems reviewed and are negative.  Past Medical History  Diagnosis Date  . End stage renal disease   . HTN (hypertension)   . HLD (hyperlipidemia)   . Anemia   . Hyperparathyroidism   . Diabetes mellitus without complication    Past Surgical History  Procedure Laterality Date  . Cardiac catheterization      stent placement    Family History  Problem Relation Age of Onset  . Heart failure Other   . Hypertension Other   . Leukemia Other   . Diabetes Other    Social History:  reports that he has been smoking.  He does not have any smokeless tobacco history on file. He reports that he does not drink alcohol or use illicit drugs. Allergies: No Known Allergies Medications Prior to Admission  Medication Sig Dispense Refill  . acetaminophen (TYLENOL) 500 MG tablet Take 1,000  mg by mouth every 6 (six) hours as needed for moderate pain.    [name] albuterol (PROVENTIL HFA;VENTOLIN HFA) 108 (90 BASE) MCG/ACT inhaler Inhale 1 puff into the lungs every 6 (six) hours as needed for wheezing or shortness of breath.    [name] aspirin 325 MG tablet Take 325 mg by mouth daily.     . budesonide-formoterol (SYMBICORT) 160-4.5 MCG/ACT inhaler Inhale 2 puffs into the lungs 2 (two) times daily.    . calcium acetate, Phos Binder, (PHOSLYRA) 667 MG/5ML SOLN Take 667 mg by mouth 3 (three) times daily with meals.    . clindamycin (CLEOCIN) 300 MG capsule Take 300 mg by mouth every 6 (six) hours.    . furosemide (LASIX) 80 MG tablet Take 80 mg by mouth 2 (two) times daily.    . metoprolol tartrate (LOPRESSOR) 25 MG tablet Take 25 mg by mouth 2 (two) times daily.      . sevelamer carbonate (RENVELA) 800 MG tablet Take 800 mg by mouth 3 (three) times daily with meals.    . simvastatin (ZOCOR) 20 MG tablet Take 20 mg by mouth at bedtime.     . sodium bicarbonate 650 MG tablet Take 650 mg by mouth daily.    [name] tiotropium (SPIRIVA) 18 MCG inhalation capsule Place 18 mcg into inhaler and inhale daily.    . cephALEXin (KEFLEX) 500 MG capsule Take 1 capsule (500 mg total) by mouth 4 (four) times daily. (Patient not taking: Reported on 05/04/2014) 40 capsule 0  . HYDROcodone-acetaminophen (NORCO/VICODIN) 5-325 MG per tablet Take 2 tablets by mouth every 4 (four) hours as needed for moderate pain  or severe pain. (Patient not taking: Reported on 05/04/2014) 20 tablet 0  . levofloxacin (LEVAQUIN) 500 MG tablet Take 500 mg by mouth daily.    . sevelamer (RENAGEL) 800 MG tablet Take 800 mg by mouth 3 (three) times daily. Take 2 tabs      Home: Home Living Family/patient expects to be discharged to:: Private residence Living Arrangements: Other relatives Available Help at Discharge: Family, Available PRN/intermittently Type of Home: House Home Access: Level entry Home Layout: One level Additional Comments: pt  lives with sister  Functional History: Prior Function Level of Independence: Independent Comments: reports he was able to put very little wt on left side before surgery due to pain Functional Status:  Mobility: Bed Mobility Overal bed mobility: Needs Assistance, +2 for physical assistance Bed Mobility: Supine to Sit, Sit to Supine Supine to sit: Mod assist, +2 for physical assistance Sit to supine: Max assist, +2 for physical assistance General bed mobility comments: very slow mvmt due to pain, pt needed much encouragement Transfers Overall transfer level: Needs assistance Equipment used: Rolling walker (2 wheeled) Transfers: Sit to/from Stand Sit to Stand: +2 physical assistance, Mod assist General transfer comment: pt achieved partial stand but maintained only 30 sec before stating that pain was too great and needed to sit down. Therefore, did not attempt transfer to chair as originally planned Ambulation/Gait General Gait Details: NT    ADL:    Cognition: Cognition Overall Cognitive Status: Within Functional Limits for tasks assessed Orientation Level: Oriented X4 Cognition Arousal/Alertness: Awake/alert Behavior During Therapy: Anxious Overall Cognitive Status: Within Functional Limits for tasks assessed  Blood pressure 120/70, pulse 67, temperature 98.4 F (36.9 C), temperature source Oral, resp. rate 17, height 6' 2.5" (1.892 m), weight 76 kg (167 lb 8.8 oz), SpO2 100 %. Physical Exam  Constitutional: He is oriented to person, place, and time. He appears well-developed and well-nourished.  HENT:  Head: Normocephalic.  Eyes: Conjunctivae and EOM are normal. Pupils are equal, round, and reactive to light.  Neck: Normal range of motion. Neck supple. No JVD present. No tracheal deviation present. No thyromegaly present.  Cardiovascular: Normal rate and regular rhythm.   No murmur heard. Respiratory: Effort normal and breath sounds normal. No respiratory distress. He has  no wheezes.  GI: Soft. Bowel sounds are normal. He exhibits no distension. There is no tenderness.  Musculoskeletal: He exhibits edema (left bk site).  Neurological: He is alert and oriented to person, place, and time. No cranial nerve deficit.  UE's: 5/5 prox to distal. LE: able to lift left leg against gravity. RLE: 3+ HF, 4/5 KE and ADF/APF  Skin:  Left knee BKA site is dressed appropriately tender  Psychiatric: He has a normal mood and affect. His behavior is normal. Thought content normal.    Results for orders placed or performed during the hospital encounter of 05/04/14 (from the past 24 hour(s))  Glucose, capillary     Status: None   Collection Time: 05/08/14  7:36 AM  Result Value Ref Range   Glucose-Capillary 96 70 - 99 mg/dL  Glucose, capillary     Status: Abnormal   Collection Time: 05/08/14 12:10 PM  Result Value Ref Range   Glucose-Capillary 116 (H) 70 - 99 mg/dL  Glucose, capillary     Status: None   Collection Time: 05/08/14  4:44 PM  Result Value Ref Range   Glucose-Capillary 99 70 - 99 mg/dL  Glucose, capillary     Status: Abnormal   Collection Time: 05/08/14  9:08 PM  Result Value Ref Range   Glucose-Capillary 101 (H) 70 - 99 mg/dL   No results found.  Assessment/Plan: Diagnosis: left BKA 1. Does the need for close, 24 hr/day medical supervision in concert with the patient's rehab needs make it unreasonable for this patient to be served in a less intensive setting? Yes 2. Co-Morbidities requiring supervision/potential complications: esrd, cad 3. Due to bowel management, safety, skin/wound care, disease management, medication administration, pain management and patient education, does the patient require 24 hr/day rehab nursing? Yes 4. Does the patient require coordinated care of a physician, rehab nurse, PT (1-2 hrs/day, 5 days/week) and OT (1-2 hrs/day, 5 days/week) to address physical and functional deficits in the context of the above medical diagnosis(es)?  Yes Addressing deficits in the following areas: balance, endurance, locomotion, strength, transferring, bowel/bladder control, bathing, dressing, feeding, grooming, toileting and psychosocial support 5. Can the patient actively participate in an intensive therapy program of at least 3 hrs of therapy per day at least 5 days per week? Yes 6. The potential for patient to make measurable gains while on inpatient rehab is excellent 7. Anticipated functional outcomes upon discharge from inpatient rehab are modified independent  with PT, modified independent and supervision with OT, n/a with SLP. 8. Estimated rehab length of stay to reach the above functional goals is: 10 days 9. Does the patient have adequate social supports and living environment to accommodate these discharge functional goals? Yes 10. Anticipated D/C setting: Home 11. Anticipated post D/C treatments: HH therapy and Outpatient therapy 12. Overall Rehab/Functional Prognosis: excellent  RECOMMENDATIONS: This patient's condition is appropriate for continued rehabilitative care in the following setting: CIR Patient has agreed to participate in recommended program. Yes Note that insurance prior authorization may be required for reimbursement for recommended care.  Comment: Rehab Admissions Coordinator to follow up.  Thanks,  Meredith Staggers, MD, Mellody Drown     05/09/2014

## 2014-05-09 NOTE — Procedures (Signed)
I was present at this session.  I have reviewed the session itself and made appropriate changes.  BPs in 120s sys.  Access press ok. olc green.  RUA avf ok.  Joel Drennan L 12/14/20153:09 PM

## 2014-05-09 NOTE — Progress Notes (Signed)
Physical Therapy Treatment Patient Details Name: [name] MRN: [id number] DOB: 18-Jun-1955 Today's Date: 05/09/2014    History of Present Illness [name] is a 58 y.o. male with history of ESRD on hemodialysis on Monday Wednesday and Friday, hypertension, hyperlipidemia, CAD status post stenting, diabetes mellitus type 2 was referred to the ER from the wound center after patient was found to have gangrenous foot on the left side. 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    PT Comments    Pt progressing slowly towards physical therapy goals. Pt was limited by pain, muscle spasms, and anxiety. Increased time was required for pt to complete transfers, and pt states the need to feel "in control" of his residual limb and any movement. +2 assist mainly for safety and equipment management. Feel that pt will progress well with mobility once pain is controlled. Will continue to follow.   Follow Up Recommendations  CIR     Equipment Recommendations  Wheelchair (measurements PT);Wheelchair cushion (measurements PT);Rolling walker with 5" wheels;3in1 (PT)    Recommendations for Other Services       Precautions / Restrictions Precautions Precautions: Fall Restrictions Weight Bearing Restrictions: Yes LLE Weight Bearing: Non weight bearing Other Position/Activity Restrictions: Discussed NO pillow under knee - only under residual limb below knee for comfort    Mobility  Bed Mobility Overal bed mobility: Needs Assistance;+2 for physical assistance Bed Mobility: Supine to Sit     Supine to sit: +2 for physical assistance;Min assist;HOB elevated     General bed mobility comments: Residual limb movement while on pillow to keep it supported. Pt limited by pain and anxiety and increased time and encouragement was required.   Transfers Overall transfer level: Needs assistance Equipment used: None Transfers: Lateral/Scoot Transfers          Lateral/Scoot Transfers: Min assist;+2  safety/equipment General transfer comment: Pt declined attempt to stand with RW, opted for lateral transfer. Therapist supporting residual limb and pt scooting to chair. +2 for safety only.   Ambulation/Gait             General Gait Details: Pt unable at this time.    Stairs            Wheelchair Mobility    Modified Rankin (Stroke Patients Only)       Balance Overall balance assessment: Needs assistance Sitting-balance support: Feet supported;No upper extremity supported Sitting balance-Leahy Scale: Good Sitting balance - Comments: Pt holding up residual limb with LUE, and was able to raise RUE up for OT to wash under his arms. Leaning R to get weight off L side for comfort.  Postural control: Right lateral lean                          Cognition Arousal/Alertness: Awake/alert Behavior During Therapy: Anxious Overall Cognitive Status: Within Functional Limits for tasks assessed       Memory: Decreased short-term memory (Therapist introduced herself and OT and stated that we were with therapy at the beginning of the session. Pt later made the connection that he was going to be moving out of th bed and became agitated, stating that he did not know we were from therapy.)              Exercises      General Comments General comments (skin integrity, edema, etc.): Pt education on static pressure on quads to relax them during spasms, as pt was having uncontrolled spasming and pain during  active movement.       Pertinent Vitals/Pain Pain Assessment: Faces Faces Pain Scale: Hurts worst Pain Location: L residual limb Pain Descriptors / Indicators: Cramping;Spasm;Operative site guarding;Guarding;Grimacing Pain Intervention(s): Limited activity within patient's tolerance;Monitored during session;Patient requesting pain meds-RN notified    Home Living Family/patient expects to be discharged to:: Private residence Living Arrangements: Other  relatives Available Help at Discharge: Family;Available PRN/intermittently Type of Home: House Home Access: Level entry   Home Layout: One level   Additional Comments: pt lives with sister    Prior Function Level of Independence: Independent      Comments: reports he was able to put very little wt on left side before surgery due to pain   PT Goals (current goals can now be found in the care plan section) Acute Rehab PT Goals Patient Stated Goal: return home PT Goal Formulation: With patient Time For Goal Achievement: 05/21/14 Potential to Achieve Goals: Good Progress towards PT goals: Progressing toward goals    Frequency  Min 3X/week    PT Plan Current plan remains appropriate;Frequency needs to be updated    Co-evaluation PT/OT/SLP Co-Evaluation/Treatment: Yes Reason for Co-Treatment: Complexity of the patient's impairments (multi-system involvement);Necessary to address cognition/behavior during functional activity PT goals addressed during session: Mobility/safety with mobility;Balance;Proper use of DME;Strengthening/ROM OT goals addressed during session: ADL's and self-care     End of Session Equipment Utilized During Treatment: Gait belt Activity Tolerance: Patient limited by pain;Other (comment) (Anxiety) Patient left: in chair;with call bell/phone within reach     Time: 0918-1005 PT Time Calculation (min) (ACUTE ONLY): 47 min  Charges:  $Therapeutic Activity: 23-37 mins                    G Codes:      [name] 05/29/14, 10:58 AM   [name], PT, DPT Acute Rehabilitation Services Pager: [phone]

## 2014-05-09 NOTE — Progress Notes (Signed)
Subjective: Interval History: has complaints Pain in BKA stump.  Objective: Vital signs in last 24 hours: Temp:  [98.2 F (36.8 C)-98.4 F (36.9 C)] 98.4 F (36.9 C) (12/14 0930) Pulse Rate:  [64-70] 67 (12/14 0930) Resp:  [17-18] 18 (12/14 0930) BP: (113-120)/(52-87) 118/52 mmHg (12/14 0930) SpO2:  [95 %-100 %] 99 % (12/14 0930) Weight:  [76 kg (167 lb 8.8 oz)] 76 kg (167 lb 8.8 oz) (12/13 2109) Weight change: -4.5 kg (-9 lb 14.7 oz)  Intake/Output from previous day: 12/13 0701 - 12/14 0700 In: 240 [P.O.:240] Out: -  Intake/Output this shift: Total I/O In: 240 [P.O.:240] Out: -   General appearance: alert, cooperative and mild distress Resp: diminished breath sounds bilaterally and rales bibasilar Cardio: S1, S2 normal and systolic murmur: systolic ejection 2/6, decrescendo at 2nd left intercostal space GI: pos bs, liver down 4 cm Extremities: L BKA, RUA AVF Skin: Skin color, texture, turgor normal. No rashes or lesions or drym macules on back  Lab Results:  Recent Labs  05/07/14 1300 05/08/14 0509  WBC 10.7* 11.5*  HGB 9.8* 10.0*  HCT 29.2* 29.9*  PLT 216 235   BMET:  Recent Labs  05/07/14 1300 05/07/14 1654 05/08/14 0509  NA 136*  --  135*  K 6.4* 4.3 5.6*  CL 95*  --  94*  CO2 24  --  26  GLUCOSE 93  --  91  BUN 67*  --  45*  CREATININE 9.76*  --  7.35*  CALCIUM 9.0  --  8.9   No results for input(s): PTH in the last 72 hours. Iron Studies:  Recent Labs  05/07/14 1324  IRON 26*  TIBC 179*  FERRITIN 586*    Studies/Results: No results found.  I have reviewed the patient's current medications.  Assessment/Plan: 1 ESRD can simplify regimen.  For HD today.  Acid base ok, does not need bicarb 2 Anemia check Fe 3 HPTH follow 4 HTN controlle 5 PVD per VVS 6 COPD 7 CAD P HD, lower dry, simplify meds     LOS: 5 days   [name] L 05/09/2014,9:47 AM

## 2014-05-09 NOTE — Progress Notes (Signed)
I met with pt at bedside. I discussed his options of inpt rehab admission vs SNF depending on his preference for intensity of therapy and length of stay. He requested me to contact his sister, Terrence Dupont, to discuss along with his other sisters. They will arrive at 12 noon and I will return to discuss with them, [phone]

## 2014-05-10 DIAGNOSIS — I25110 Atherosclerotic heart disease of native coronary artery with unstable angina pectoris: Secondary | ICD-10-CM

## 2014-05-10 LAB — RENAL FUNCTION PANEL
ANION GAP: 11 (ref 5–15)
Albumin: 2 g/dL — ABNORMAL LOW (ref 3.5–5.2)
BUN: 35 mg/dL — AB (ref 6–23)
CHLORIDE: 96 meq/L (ref 96–112)
CO2: 32 mEq/L (ref 19–32)
Calcium: 8.5 mg/dL (ref 8.4–10.5)
Creatinine, Ser: 6.07 mg/dL — ABNORMAL HIGH (ref 0.50–1.35)
GFR calc Af Amer: 10 mL/min — ABNORMAL LOW (ref >90)
GFR calc non Af Amer: 9 mL/min — ABNORMAL LOW (ref >90)
Glucose, Bld: 103 mg/dL — ABNORMAL HIGH (ref 70–99)
PHOSPHORUS: 5 mg/dL — AB (ref 2.3–4.6)
POTASSIUM: 4.7 meq/L (ref 3.7–5.3)
SODIUM: 139 meq/L (ref 137–147)

## 2014-05-10 LAB — GLUCOSE, CAPILLARY
GLUCOSE-CAPILLARY: 105 mg/dL — AB (ref 70–99)
Glucose-Capillary: 135 mg/dL — ABNORMAL HIGH (ref 70–99)

## 2014-05-10 MED ORDER — PANTOPRAZOLE SODIUM 40 MG PO TBEC: 40.0000 mg | DELAYED_RELEASE_TABLET | Freq: Every day | ORAL | Status: DC

## 2014-05-10 MED ORDER — MORPHINE SULFATE 2 MG/ML IJ SOLN: 1.0000 mg | INTRAMUSCULAR | Status: DC | PRN

## 2014-05-10 MED ORDER — CALCIUM ACETATE 667 MG PO CAPS: 667.0000 mg | ORAL_CAPSULE | Freq: Three times a day (TID) | ORAL | Status: DC

## 2014-05-10 MED ORDER — SODIUM CHLORIDE 0.9 % IV SOLN: 125.0000 mg | INTRAVENOUS | Status: DC

## 2014-05-10 MED ORDER — OXYCODONE HCL 5 MG PO TABS: 5.0000 mg | ORAL_TABLET | ORAL | Status: DC | PRN

## 2014-05-10 MED ORDER — DOXERCALCIFEROL 4 MCG/2ML IV SOLN: 2.5000 ug | INTRAVENOUS | Status: DC

## 2014-05-10 MED ORDER — DARBEPOETIN ALFA 40 MCG/0.4ML IJ SOSY: 40.0000 ug | PREFILLED_SYRINGE | INTRAMUSCULAR | Status: DC

## 2014-05-10 MED ORDER — GABAPENTIN 100 MG PO CAPS
100.0000 mg | ORAL_CAPSULE | Freq: Three times a day (TID) | ORAL | Status: DC
Start: 2014-05-10 — End: 2015-01-12

## 2014-05-10 MED ORDER — NICOTINE 21 MG/24HR TD PT24: 21.0000 mg | MEDICATED_PATCH | Freq: Every day | TRANSDERMAL | Status: DC

## 2014-05-10 MED ORDER — ONDANSETRON HCL 4 MG PO TABS: 4.0000 mg | ORAL_TABLET | Freq: Four times a day (QID) | ORAL | Status: DC | PRN

## 2014-05-10 MED ORDER — RENA-VITE PO TABS: 1.0000 | ORAL_TABLET | Freq: Every day | ORAL | Status: DC

## 2014-05-10 NOTE — Clinical Social Work Placement (Signed)
Clinical Social Work Department CLINICAL SOCIAL WORK PLACEMENT NOTE 05/10/2014  Patient:  [name], [name]  Account Number:  [id number] Admit date:  05/04/2014  Clinical Social Worker:  Wilhelmena Zea Givens, LCSW  Date/time:  05/10/2014 11:28 AM  Clinical Social Work is seeking post-discharge placement for this patient at the following level of care:   SKILLED NURSING   (*CSW will update this form in Epic as items are completed)   05/10/2014  Patient/family provided with [place] Department of Clinical Social Work's list of facilities offering this level of care within the geographic area requested by the patient (or if unable, by the patient's family).  05/10/2014  Patient/family informed of their freedom to choose among providers that offer the needed level of care, that participate in Medicare, Medicaid or managed care program needed by the patient, have an available bed and are willing to accept the patient.    Patient/family informed of [facility]' ownership interest in [facility], as well as of the fact that they are under no obligation to receive care at this facility.  PASARR submitted to EDS on 05/09/2014 PASARR number received on 05/09/2014  FL2 transmitted to all facilities in geographic area requested by pt/family on  05/09/2014 FL2 transmitted to all facilities within larger geographic area on   Patient informed that his/her managed care company has contracts with or will negotiate with  certain facilities, including the following:     Patient/family informed of bed offers received:  05/10/2014 Patient chooses bed at [facility] Physician recommends and patient chooses bed at    Patient to be transferred to [facility] on  05/10/2014 Patient to be transferred to facility by ambulance Patient and family notified of transfer on 05/10/2014 Name of family member notified:  Hollie Beach by phone [phone])  The following physician request were  entered in Epic:   Additional Comments:

## 2014-05-10 NOTE — Progress Notes (Signed)
Called and gave report to [facility] staff Jackelyn Poling, nurse). Told Debbie that [facility] called to make sure pt was at clinic in time enough for 0530 chair time; Jackelyn Poling states she will relay message to appropriate individual. IV removed without issue. Right before pickup by ambulance service pt experienced nausea and vomiting; pt given 4mg  of Zofran PO. Pt leaving unit via stretcher by ambulance service.

## 2014-05-10 NOTE — Care Management Note (Signed)
CARE MANAGEMENT NOTE 05/10/2014  Patient:  [name], [name]   Account Number:  [id number]  Date Initiated:  05/08/2014  Documentation initiated by:  [facility]  Subjective/Objective Assessment:   L foot s/p BKA 05/06/14     Action/Plan:   05/10/2014 IM given, this pt will d/c to SNF for short term rehab.   Anticipated DC Date:  05/10/2014   Anticipated DC Plan:  [name]  CM consult      Choice offered to / List presented to:             Status of service:  Completed, signed off Medicare Important Message given?  YES (If response is "NO", the following Medicare IM given date fields will be blank) Date Medicare IM given:  05/10/2014 Medicare IM given by:  Angas Isabell Date Additional Medicare IM given:   Additional Medicare IM given by:    Discharge Disposition:  [name]  Per UR Regulation:  Reviewed for med. necessity/level of care/duration of stay  If discussed at [place] of Stay Meetings, dates discussed:    Comments:

## 2014-05-10 NOTE — Progress Notes (Signed)
Subjective: Interval History: has no complaint, to go to [facility] in [place].  Objective: Vital signs in last 24 hours: Temp:  [98.2 F (36.8 C)-100.9 F (38.3 C)] 98.2 F (36.8 C) (12/15 1000) Pulse Rate:  [65-85] 85 (12/15 1000) Resp:  [18-19] 18 (12/15 1000) BP: (117-136)/(67-80) 126/77 mmHg (12/15 1000) SpO2:  [95 %-100 %] 95 % (12/15 1000) Weight:  [77.9 kg (171 lb 11.8 oz)-79.2 kg (174 lb 9.7 oz)] 77.9 kg (171 lb 11.8 oz) (12/14 2108) Weight change: 3.2 kg (7 lb 0.9 oz)  Intake/Output from previous day: 12/14 0701 - 12/15 0700 In: 710 [P.O.:600; IV Piggyback:110] Out: 1995  Intake/Output this shift: Total I/O In: 240 [P.O.:240] Out: 300 [Emesis/NG output:300]  General appearance: alert, cooperative and slowed mentation Resp: diminished breath sounds bilaterally Cardio: S1, S2 normal and systolic murmur: systolic ejection 2/6, decrescendo at 2nd left intercostal space GI: mild distension and mild tender esp RUQ., pos bs Extremities: AVF RUA B&T, L BKA,   Lab Results:  Recent Labs  05/08/14 0509 05/09/14 1332  WBC 11.5* 9.9  HGB 10.0* 10.1*  HCT 29.9* 29.9*  PLT 235 271   BMET:  Recent Labs  05/09/14 1332 05/10/14 0907  NA 132* 139  K 6.1* 4.7  CL 93* 96  CO2 23 32  GLUCOSE 86 103*  BUN 65* 35*  CREATININE 9.36* 6.07*  CALCIUM 9.2 8.5   No results for input(s): PTH in the last 72 hours. Iron Studies:  Recent Labs  05/07/14 1324  IRON 26*  TIBC 179*  FERRITIN 586*    Studies/Results: No results found.  I have reviewed the patient's current medications.  Assessment/Plan: 1 ESRD for HD in am.  To go to NH in [place] close to past facility. 2 HTN controlled with vol 3 Anemia give Fe 4 PVD per VVS 5 HPTH meds P HD, d/c with bed avail    LOS: 6 days   Endora Teresi L 05/10/2014,10:52 AM

## 2014-05-10 NOTE — Clinical Social Work Psychosocial (Signed)
Clinical Social Work Department BRIEF PSYCHOSOCIAL ASSESSMENT 05/10/2014  Patient:  [name], [name]     Account Number:  [id number]     Admit date:  05/04/2014  Clinical Social Worker:  Frederico Hamman  Date/Time:  05/10/2014 11:14 AM  Referred by:  Physician  Date Referred:  05/07/2014 Referred for  SNF Placement   Other Referral:   Interview type:  Patient Other interview type:   CSW also talked with patient's sister [name] ([phone]) by phone.    PSYCHOSOCIAL DATA Living Status:  [place] Admitted from facility:   Level of care:   Primary support name:  [name] Primary support relationship to patient:  S[place]LING Degree of support available:   Patient lives with his sister who assists him at home.    CURRENT CONCERNS Current Concerns  Post-Acute Placement   Other Concerns:    SOCIAL WORK ASSESSMENT / PLAN [name] was being evaluated for inpatient rehab, however per Danne Baxter with CIR, his family prefers patient to go to [facility] in [place]. Before going to talk with patient, CSW contacted [facility] and spoke with Lottie Dawson in admissions. He had spoken with patient's family and they are interested in patient transitioning from [place] to [place] at their facility. [name] advised CSW that they will not be able to take patient as they don't anticipate a LTC bed anytime soon.    CSW visited with [name] and talked with him regarding rehab at a skilled facility and he is in agreement. Patient was presented with the facility responses for [facility] and chose [facility]. Call made to patient's sister [name] and she was informed of conversation with Research officer, political party and patient's choice of [facility].   Assessment/plan status:  No Further Intervention Required Other assessment/ plan:   Information/referral to community resources:   Patient provided with SNF list with facility responses  added on.    PATIENT'S/FAMILY'S RESPONSE TO PLAN OF CARE: [name] receptive to talking with CSW and in agreement with short-term rehab. When asked, patient was informed that he will be transported to facility by ambulance.

## 2014-05-10 NOTE — Discharge Summary (Signed)
PATIENT DETAILS Name: [name] Age: 58 y.o. Sex: male Date of Birth: 1956-05-23 MRN: [id number]. Admitting Physician: Theressa Millard, MD [id number], Edmonia Lynch, MD  Admit Date: 05/04/2014 Discharge date: 05/10/2014  Recommendations for Outpatient Follow-up:  1. Please ensure follow up with Vascular surgery for wound check-see below 2. Periodic CBC/BMET at discretion of MD at SNF   PRIMARY DISCHARGE DIAGNOSIS:  Principal Problem:   Gangrene of foot Active Problems:   HYPERTENSION, BENIGN   ESRD (end stage renal disease) on dialysis   CAD (coronary artery disease)   Normocytic anemia      PAST MEDICAL HISTORY: Past Medical History  Diagnosis Date  . End stage renal disease   . HTN (hypertension)   . HLD (hyperlipidemia)   . Anemia   . Hyperparathyroidism   . Diabetes mellitus without complication     DISCHARGE MEDICATIONS: Current Discharge Medication List    START taking these medications   Details  calcium acetate (PHOSLO) 667 MG capsule Take 1 capsule (667 mg total) by mouth 3 (three) times daily with meals.    Darbepoetin Alfa (ARANESP) 40 MCG/0.4ML SOSY injection Inject 0.4 mLs (40 mcg total) into the vein every Monday with hemodialysis. Qty: 8.4 mL    doxercalciferol (HECTOROL) 4 MCG/2ML injection Inject 1.25 mLs (2.5 mcg total) into the vein every Monday, Wednesday, and Friday with hemodialysis. Qty: 2 mL    ferric gluconate 125 mg in sodium chloride 0.9 % 100 mL Inject 125 mg into the vein every Monday, Wednesday, and Friday with hemodialysis.    gabapentin (NEURONTIN) 100 MG capsule Take 1 capsule (100 mg total) by mouth 3 (three) times daily.    multivitamin (RENA-VIT) TABS tablet Take 1 tablet by mouth at bedtime. Refills: 0    nicotine (NICODERM CQ - DOSED IN MG/24 HOURS) 21 mg/24hr patch Place 1 patch (21 mg total) onto the skin daily. Qty: 28 patch, Refills: 0    ondansetron (ZOFRAN) 4 MG tablet Take 1 tablet (4 mg total) by mouth every  6 (six) hours as needed for nausea. Qty: 20 tablet, Refills: 0    oxyCODONE (OXY IR/ROXICODONE) 5 MG immediate release tablet Take 1-2 tablets (5-10 mg total) by mouth every 4 (four) hours as needed for moderate pain. Qty: 30 tablet, Refills: 0    pantoprazole (PROTONIX) 40 MG tablet Take 1 tablet (40 mg total) by mouth daily.      CONTINUE these medications which have NOT CHANGED   Details  acetaminophen (TYLENOL) 500 MG tablet Take 1,000 mg by mouth every 6 (six) hours as needed for moderate pain.    albuterol (PROVENTIL HFA;VENTOLIN HFA) 108 (90 BASE) MCG/ACT inhaler Inhale 1 puff into the lungs every 6 (six) hours as needed for wheezing or shortness of breath.    aspirin 325 MG tablet Take 325 mg by mouth daily.     budesonide-formoterol (SYMBICORT) 160-4.5 MCG/ACT inhaler Inhale 2 puffs into the lungs 2 (two) times daily.    metoprolol tartrate (LOPRESSOR) 25 MG tablet Take 25 mg by mouth 2 (two) times daily.      sevelamer carbonate (RENVELA) 800 MG tablet Take 800 mg by mouth 3 (three) times daily with meals.    simvastatin (ZOCOR) 20 MG tablet Take 20 mg by mouth at bedtime.     tiotropium (SPIRIVA) 18 MCG inhalation capsule Place 18 mcg into inhaler and inhale daily.      STOP taking these medications     calcium acetate, Phos Binder, (PHOSLYRA) 667  MG/5ML SOLN      clindamycin (CLEOCIN) 300 MG capsule      furosemide (LASIX) 80 MG tablet      sodium bicarbonate 650 MG tablet      cephALEXin (KEFLEX) 500 MG capsule      HYDROcodone-acetaminophen (NORCO/VICODIN) 5-325 MG per tablet      levofloxacin (LEVAQUIN) 500 MG tablet      sevelamer (RENAGEL) 800 MG tablet         ALLERGIES:  No Known Allergies  BRIEF HPI:  See H&P, Labs, Consult and Test reports for all details in brief, patient is a 58 y.o. male with history of ESRD on hemodialysis on Monday Wednesday and Friday, hypertension, hyperlipidemia, CAD status post stenting, diabetes mellitus type 2 was  referred to the ER from the wound center after patient was found to have gangrenous foot on the left side.  CONSULTATIONS:   nephrology and vascular surgery  PERTINENT RADIOLOGIC STUDIES: Dg Chest 2 View  05/04/2014   CLINICAL DATA:  Gangrene of the toes on the left foot.  EXAM: CHEST  2 VIEW  COMPARISON:  03/12/2014  FINDINGS: There is chronic cardiomegaly. Pulmonary vascularity is normal. There is a persistent moderate right pleural effusion, slightly diminished since the prior exam. Left lung is clear.  No acute osseous abnormality.  IMPRESSION: Slight decrease in moderate right pleural effusion.   Electronically Signed   By: Rozetta Nunnery M.D.   On: 05/04/2014 18:08   Dg Foot Complete Left  05/04/2014   CLINICAL DATA:  Subsequent encounter for diabetes with gangrene on left toes.  EXAM: LEFT FOOT - COMPLETE 3+ VIEW  COMPARISON:  04/12/2014.  FINDINGS: No evidence of fracture. No subluxation or dislocation. There is some lucency over the second toe in tip of the great toe suggesting soft tissue disease. No underlying osteomyelitis or destruction of bony anatomy is evident.  IMPRESSION: Stable. Soft tissue defects in the first and second toes without evidence for underlying bony destruction.   Electronically Signed   By: Misty Stanley M.D.   On: 05/04/2014 17:51     PERTINENT LAB RESULTS: CBC:  Recent Labs  05/08/14 0509 05/09/14 1332  WBC 11.5* 9.9  HGB 10.0* 10.1*  HCT 29.9* 29.9*  PLT 235 271   CMET CMP     Component Value Date/Time   NA 132* 05/09/2014 1332   K 6.1* 05/09/2014 1332   CL 93* 05/09/2014 1332   CO2 23 05/09/2014 1332   GLUCOSE 86 05/09/2014 1332   BUN 65* 05/09/2014 1332   CREATININE 9.36* 05/09/2014 1332   CALCIUM 9.2 05/09/2014 1332   PROT 7.6 05/05/2014 0348   ALBUMIN 2.0* 05/09/2014 1332   AST 39* 05/05/2014 0348   ALT 23 05/05/2014 0348   ALKPHOS 182* 05/05/2014 0348   BILITOT 1.0 05/05/2014 0348   GFRNONAA 5* 05/09/2014 1332   GFRAA 6* 05/09/2014  1332    GFR Estimated Creatinine Clearance: 9.2 mL/min (by C-G formula based on Cr of 9.36). No results for input(s): LIPASE, AMYLASE in the last 72 hours. No results for input(s): CKTOTAL, CKMB, CKMBINDEX, TROPONINI in the last 72 hours. Invalid input(s): POCBNP No results for input(s): DDIMER in the last 72 hours. No results for input(s): HGBA1C in the last 72 hours. No results for input(s): CHOL, HDL, LDLCALC, TRIG, CHOLHDL, LDLDIRECT in the last 72 hours. No results for input(s): TSH, T4TOTAL, T3FREE, THYROIDAB in the last 72 hours.  Invalid input(s): FREET3  Recent Labs  05/07/14 1324  FERRITIN  586*  TIBC 179*  IRON 26*   Coags: No results for input(s): INR in the last 72 hours.  Invalid input(s): PT Microbiology: Recent Results (from the past 240 hour(s))  MRSA PCR Screening     Status: None   Collection Time: 05/05/14  1:05 AM  Result Value Ref Range Status   MRSA by PCR NEGATIVE NEGATIVE Final    Comment:        The GeneXpert MRSA Assay (FDA approved for NASAL specimens only), is one component of a comprehensive MRSA colonization surveillance program. It is not intended to diagnose MRSA infection nor to guide or monitor treatment for MRSA infections.   Surgical pcr screen     Status: None   Collection Time: 05/05/14 10:30 PM  Result Value Ref Range Status   MRSA, PCR NEGATIVE NEGATIVE Final   Staphylococcus aureus NEGATIVE NEGATIVE Final    Comment:        The Xpert SA Assay (FDA approved for NASAL specimens in patients over 64 years of age), is one component of a comprehensive surveillance program.  Test performance has been validated by [employer] for patients greater than or equal to 71 year old. It is not intended to diagnose infection nor to guide or monitor treatment.      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 Current plans are discharge to SNF when bed is available. Active Problems:  End-stage renal disease: On hemodialysis MWF. Renal consulted during this hospital stay, patient will to resume HD at his usual schedule   History of asthma: Lungs currently clear, this appears stable. Continue Symbicort, Spiriva. Continue as needed albuterol nebulizer.    History of CAD-status post remote PCI : Currently asymptomatic, continue aspirin, statin and metoprolol.    Hypertension: Continue with metoprolol    Normocytic anemia: Secondary to chronic kidney disease, IV iron and erythropoietin per nephrology.    History of dyslipidemia: Continue statin   History of type 2 diabetes: Managed with SSI while inpatient, hemoglobin 6.2. Not on any oral hypoglycemic agents/insulin as an outpatient.Please continue to monitor off medications-and follow A1C -suggest to repeat in 3 months.    TODAY-DAY OF DISCHARGE:  Subjective:   [name] today has no headache,no chest abdominal pain,no new weakness tingling or numbness  Objective:   Blood pressure 132/75, pulse 81, temperature 99.5 F (37.5 C), temperature source Oral, resp. rate 18, height 6' 2.5" (1.892 m), weight 77.9 kg (171 lb 11.8 oz), SpO2 97 %.  Intake/Output Summary (Last 24 hours) at 05/10/14 0951 Last data filed at 05/10/14 0711  Gross per 24 hour  Intake    470 ml  Output   2295 ml  Net  -1825 ml   Filed Weights   05/09/14 1324 05/09/14 1800 05/09/14 2108  Weight: 79.2 kg (174 lb 9.7 oz) 77.9 kg (171 lb 11.8 oz) 77.9 kg (171 lb 11.8 oz)    Exam Awake Alert, Oriented *3, No new F.N deficits, Normal affect [place].AT,PERRAL Supple Neck,No JVD, No cervical lymphadenopathy appriciated.  Symmetrical Chest wall movement, Good air movement bilaterally, CTAB RRR,No  Gallops,Rubs or new Murmurs, No Parasternal Heave +ve B.Sounds, Abd Soft, Non tender, No organomegaly appriciated, No rebound -guarding or rigidity. No Cyanosis, Clubbing or edema, No new Rash or bruise  DISCHARGE CONDITION: Stable  DISPOSITION: SNF  DISCHARGE INSTRUCTIONS:    Activity:  As tolerated with Full fall precautions use walker/cane & assistance as needed  Diet recommendation: Diabetic Diet Heart Healthy diet  Discharge Instructions    Call MD for:  redness, tenderness, or signs of infection (pain, swelling, redness, odor or green/yellow discharge around incision site)    Complete by:  As directed      Diet - low sodium heart healthy    Complete by:  As directed      Increase activity slowly    Complete by:  As directed            Follow-up Information    Follow up with Clide Deutscher, NGWE A, MD. Schedule an appointment as soon as possible for a visit in 1 week.   Specialty:  Family Medicine   Contact information:   [place] [address] [phone]       Follow up with Elam Dutch, MD. Schedule an appointment as soon as possible for a visit in 1 week.   Specialty:  Vascular Surgery   Contact information:   [address] [place] [address] [phone]       Please follow up.   Contact information:   Follow up with your Hemodialysis center at your usual schedule     Total Time spent on discharge equals 45 minutes.  SignedOren Binet 05/10/2014 9:51 AM

## 2014-06-08 ENCOUNTER — Encounter: Payer: Self-pay | Admitting: Vascular Surgery

## 2014-06-09 ENCOUNTER — Encounter: Payer: Self-pay | Admitting: Vascular Surgery

## 2014-06-09 ENCOUNTER — Ambulatory Visit (INDEPENDENT_AMBULATORY_CARE_PROVIDER_SITE_OTHER): Payer: Self-pay | Admitting: Vascular Surgery

## 2014-06-09 VITALS — BP 126/79 | HR 70 | Temp 98.9°F | Resp 18 | Ht 74.5 in | Wt 164.0 lb

## 2014-06-09 DIAGNOSIS — I739 Peripheral vascular disease, unspecified: Secondary | ICD-10-CM

## 2014-06-09 NOTE — Progress Notes (Signed)
Patient is a 59 year old male who returns for follow-up today. He underwent left below-knee amputation on 05/06/2014. His incision is well-healed. All staples are out. He currently is residing at a skilled nursing facility. He reports no drainage.  Physical exam:  Filed Vitals:   06/09/14 1518  BP: 126/79  Pulse: 70  Temp: 98.9 F (37.2 C)  TempSrc: Oral  Resp: 18  Height: 6' 2.5" (1.892 m)  Weight: 164 lb (74.39 kg)  SpO2: 98%    Left lower extremity: Well-healed left below-knee amputation  Assessment: Doing well status post left below-knee amputation in her  Plan: Patient was given a prescription today for a new shrinker for his left below-knee amputation hopefully in the future he will be able to get a prosthetic leg. He will follow-up with me on an as-needed basis.  Ruta Hinds, MD [facility] Office: [phone] Pager: [phone]

## 2014-08-29 ENCOUNTER — Ambulatory Visit
Admit: 2014-08-29 | Disposition: A | Discharge status: Home / Self Care | Payer: Self-pay | Attending: Vascular Surgery | Admitting: Vascular Surgery

## 2014-09-17 NOTE — Discharge Summary (Signed)
PATIENT NAME:  [name], [name] MR#:  [id number] DATE OF BIRTH:  05-07-1956  DATE OF ADMISSION:  07/06/2013 DATE OF DISCHARGE:  07/08/2013  DISCHARGE DIAGNOSES: 1.  Fluid overload. 2.  Ascites and pleural effusion due to that.  3.  Acute on chronic systolic heart failure, ejection fraction 20%, worsening compared to past cardiac catheterization. No ischemia. Likely hypertensive cardiomyopathy.  4.  End-stage renal disease on hemodialysis.  5.  Hypertension.  6.  Possible sleep apnea; needs sleep study with pulmonary.    CODE STATUS: Full code.   CONDITION ON DISCHARGE: Stable.   DISCHARGE MEDICATIONS:  1.  Simvastatin 20 mg oral tablet once a day.  2.  Renagel 800 mg oral tablet 3 times a day.  3.  Aspirin 325 mg oral tablet once a day.  4.  Furosemide 40 mg oral 2 times a day.  5.  Sodium bicarbonate 650 mg oral tablet once a day.  6.  Spiriva 18 mcg inhalation capsule once a day.  7.  Symbicort 2 puffs inhalation 2 times a day.  8.  ProAir 2 puff 4 times a day as needed. 9.  Enalapril 10 mg oral tablet once a day.  10.  Carvedilol 25 mg 2 times a day.  11.  Ceftin 500 mg oral tablet 2 times a day for three days.  12.  Nicotine 1 patch transdermal once a day.    DIET ON DISCHARGE: Renal diet, regular consistency.   ACTIVITY: As tolerated.   TIMEFRAME TO FOLLOW-UP: Within 1 to 2 weeks, advised to make an appointment with Dr. Humphrey Rolls, cardiology within a week. Follow with primary care physician to have sleep study and evaluate for sleep apnea, and also to continue routine hemodialysis.   HISTORY OF PRESENTING ILLNESS: A 59 year old African American male with history of end-stage renal disease, hypertension, hyperlipidemia, congestive heart failure, and coronary artery disease, presented to Emergency Room with worsening shortness of breath and leg edema for one month. The patient stated he had shortness of breath and leg edema on and off, getting worse for the last one month. Edema  increased up to thigh.   HOSPITAL COURSE AND STAY:  1.  He was given hemodialysis two consecutive days in the hospital, and got more fluid out. Echocardiogram was done, which showed ejection fraction of 20%. He had ejection fraction of up to 40% two years ago on echocardiogram, so cardiology consult was called in for worsening of his ejection fraction. Cardiac catheterization was done by cardiologist to find the cause, and there are no significant blockages, so the reason for his coronary artery disease  was given to be hypertensive cardiomyopathy, and advised to have medical management and better control.  2.  Elevated troponin. Possibly it was due to congestive heart failure. End-stage renal disease remained stable. Cardiology consult was done and cardiac catheterization was done.  3.  End-stage renal disease. Hemodialysis was done twice.  4.  Coronary artery disease. Cardiac medication continued.  5.  Hypertension, which was controlled in the hospital.  6.  Pulmonary nodule. Advised to follow with primary care physician and also to have sleep study with pulmonary clinic.  7.  Urinary tract infection. He was on Rocephin and discharged on Ceftin.   IMPORTANT LABORATORY AND DIAGNOSTICS: BUN was 80, creatinine was 15.92. Potassium was 4.9 on admission. WBC 4.4, hemoglobin 13.4 and platelet count 107 on admission. Troponin is 0.55. Chest x-ray showed 3 mm pulmonary nodule in the right upper lobe. Urinalysis is positive with  84 WBCs and 1+ leukocyte esterase.   TOTAL TIME SPENT ON THIS DISCHARGE: 40 minutes.     ____________________________ Ceasar Lund Anselm Jungling, MD vgv:cg D: 07/12/2013 22:52:27 ET T: 07/13/2013 01:11:24 ET JOB#: [id number]  cc: Ceasar Lund. Anselm Jungling, MD, <Dictator> Dionisio David, MD Murlean Iba, MD Vaughan Basta MD ELECTRONICALLY SIGNED 07/25/2013 0:48

## 2014-09-17 NOTE — Consult Note (Signed)
   General Aspect CC: "I keep coughing, and I stay short of breath."   Present Illness This is a 59 year old male with past medical history of end-stage renal disease on dialysis, coronary artery disease with previous stenting, congestive heart failure, COPD, hypertension, hyperlipidemia, and continued tobacco abuse, who presents with worsening dyspnea, orthopnea, PND and coughing.  He denies any episodes of chest pain, but he has been relatively sedentary. EKG shows sinus arrhythmia with LVH. He reports that a recent echocardiogram performed approximately one month ago showed ejection fraction down to 20% from previous EF 35%. Chest x-ray on this admission shows right pleural effusion, and a thoracentesis is planned for tomorrow.  The patient states that following dialysis today, he feels somewhat better, but he does not feel is significant improvement in his dyspnea, orthopnea and coughing.   Physical Exam:  GEN no acute distress   HEENT PERRL   NECK supple  with 1+ JVD   RESP normal resp effort  very diminished breath sounds right base   CARD Regular rate and rhythm  Normal, S1, S2   ABD denies tenderness  soft  normal BS   EXTR negative edema   SKIN normal to palpation   NEURO cranial nerves intact   PSYCH alert, A+O to time, place, person   Review of Systems:  General: No Complaints   Skin: No Complaints   ENT: No Complaints   Eyes: No Complaints   Neck: No Complaints   Respiratory: Frequent cough  Short of breath   Cardiovascular: Dyspnea  Orthopnea   Gastrointestinal: No Complaints   Genitourinary: No Complaints   Vascular: No Complaints   Musculoskeletal: No Complaints   Neurologic: No Complaints   Hematologic: No Complaints   Endocrine: No Complaints   Psychiatric: No Complaints   Family & Social History:  Family and Social History:  Family History Coronary Artery Disease  Stroke   Social History positive  tobacco, 1 ppd   EKG:  EKG NSR  LVH     No Known Allergies:    Impression 59 year old male with end-stage renal disease on dialysis, known coronary artery disease with stenting, CHF, COPD, hypertension, hyperlipidemia, and continued tobacco abuse, now having significantly worsening dyspnea, orthopnea, and PND, likely multifactorial in nature, including cardiomyopathy with CHF, COPD, right pleural effusion and coronary artery disease.   Plan 1.  Continue beta blocker, ACE inhibitor for heart rate and blood pressure control. 2.  Continue Lasix 40 mg twice a day as well as dialysis at least 3 days a week. 3.  Continue simvastatin for dyslipidemia 4.  Patient was counseled on the importance of complete a total abstinence from all forms of tobacco for further risk reduction. 5.  Agree with thoracentesis for right pleural effusion 6.  Continue aggressive diuresis for cardiomyopathy with CHF, with further consideration of evaluating  coronary anatomy in the near future, if his symptoms persist or worsen.   Electronic Signatures: Barbette Merino (NP)  (Signed 22-Apr-15 16:40)  Authored: General Aspect/Present Illness, History and Physical Exam, Review of System, Family & Social History, Home Medications, EKG , Allergies, Impression/Plan   Last Updated: 22-Apr-15 16:40 by Barbette Merino (NP)

## 2014-09-17 NOTE — Discharge Summary (Signed)
PATIENT NAME:  [name], [name]:  [id number] DATE OF BIRTH:  01/14/1956  DATE OF ADMISSION:  09/15/2013 DATE OF DISCHARGE:  09/16/2013  PRESENTING COMPLAINT: Shortness of breath.   DISCHARGE DIAGNOSES: 1.  Acute on chronic systolic congestive heart failure.  2.  Pulmonary edema with volume overload.  3.  End-stage renal disease, on hemodialysis.  4.  Severe cardiomyopathy, ejection fraction 25%.  5.  Chronic right pleural effusion, status post right-sided paracentesis.  6.  Chronically elevated troponin.   PROCEDURE: Right-sided thoracentesis with removal of 2.2 liters fluid.   CODE STATUS: FULL.  DISCHARGE MEDICATIONS: 1.  Simvastatin 20 mg daily.  2.  Renagel 800 mg 2 tablets 3 times a day.  3.  Aspirin 325 mg 1 tablet daily.  4.  Furosemide 40 mg b.i.d.  5.  Sodium bicarbonate 650 mg p.o. daily.  6.  Spiriva 18 mcg inhalation 1 capsule inhalation daily.  7.  Symbicort 160/4.5 two puffs b.i.d.  8.  ProAir 2 puffs 4 times a day as needed.  9.  Enalapril 10 mg p.o. daily. 10.  Carvedilol 25 mg b.i.d.  11.  Nicotine patch 7 mg 1 patch daily.   DISCHARGE INSTRUCTIONS: 1.  Follow up with Dr. Clide Deutscher in 1 to 2 weeks.  2.  The patient recommended to resume his dialysis as before.   CONSULTANTS:  1.  Nephrology with Dr. Candiss Norse.  2.  Cardiology with Dr. Nehemiah Massed.  DIAGNOSTIC DATA:  Chest x-ray showed cardiac enlargement with mild pulmonary vascular congestion, increasing right pleural effusion.   Troponin 0.05. B-type natriuretic peptide is 145,464. White count 6.7, H and H 13.6 and 41.7. Potassium 4.2.   BRIEF SUMMARY OF HOSPITAL COURSE: Mr. Valen is a 59 year old African American gentleman with history of end-stage renal disease on hemodialysis with cardiomyopathy who came in with progressive increasing shortness of breath with right side pleural effusion getting worse in the past 2 months. He was admitted with:  1.  Acute hypoxic respiratory distress with worsening  right-sided pleural effusion and increasing pulmonary vascular congestion due to acute on chronic systolic congestive heart failure. The patient got dialyzed and removed about 3300 mL of fluid. His sats are 99% on room air. He is advised low-salt diet. Denied any chest pain.  2.  Chronic right pleural effusion with increase in size of pleural effusion on chest x-ray. The patient underwent thoracentesis on the right with removal of about 2.2 liters of fluid. His sats were 99% on room air.  3.  Chronically elevated troponin. Denying any chest pain. No intervention needed at this time per cardiology recommendation.  4.  Acute on chronic congestive heart failure, systolic. Continued Lasix along with his other cardiac meds. His symptoms improved.  5.  Hypertension. Home meds were resumed.  6.  End-stage renal disease, on hemodialysis. The patient was seen by Dr. Candiss Norse.  TIME SPENT: 40 minutes.   ____________________________ Hart Rochester Posey Pronto, MD sap:sb D: 09/17/2013 07:18:44 ET T: 09/17/2013 08:54:50 ET JOB#: [id number]  cc: Desmund Elman A. Posey Pronto, MD, <Dictator> Ilda Basset MD ELECTRONICALLY SIGNED 09/20/2013 9:17

## 2014-09-17 NOTE — Op Note (Signed)
PATIENT NAME:  [name], [name] MR#:  [id number] DATE OF BIRTH:  [birth year]  DATE OF PROCEDURE:  02/10/2014  PREOPERATIVE DIAGNOSES:   1.  Endstage renal disease. 2.  Functional right arm permanent access. 3.  Hypertension.  POSTOPERATIVE DIAGNOSES:  1.  Endstage renal disease. 2.  Functional right arm permanent access. 3.  Hypertension.  PROCEDURE: Removal of left internal jugular PermCath.  SURGEON:  Leotis Pain, MD  ANESTHESIA:  Local.  ESTIMATED BLOOD LOSS:  Minimal.  INDICATION FOR PROCEDURE: A 59 year old African American gentleman with end-stage renal disease. He has had a surgical revision of his right arm AV fistula with a jump graft. This is now being used for dialysis without any difficulties and he can have his catheter removed. Risks and benefits were discussed and informed consent was obtained.   DESCRIPTION OF PROCEDURE:  The patient's left neck, chest and existing catheter were sterilely prepped and draped.  The area around the catheter was anesthetized copiously with 1% Lidocaine. The catheter was dissected out with curved hemostats until the cuff was freed from the surrounding fibrous sheath.  The fibrous sheath was transected and the catheter was then removed in its entirety using gentle traction.  Pressure was held and sterile dressings placed.    The patient tolerated the procedure well and was taken to the recovery room in stable condition. ____________________________ Algernon Huxley, MD jsd:sb D: 02/10/2014 10:25:24 ET T: 02/10/2014 11:58:23 ET JOB#: [id number]  cc: Algernon Huxley, MD, <Dictator> Algernon Huxley MD ELECTRONICALLY SIGNED 02/10/2014 15:03

## 2014-09-17 NOTE — Op Note (Signed)
PATIENT NAME:  [name], [name] MR#:  [id number] DATE OF BIRTH:  [birth year]  DATE OF PROCEDURE:  12/16/2013  PREOPERATIVE DIAGNOSES:  1.  End-stage renal disease.  2.  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  3.  Hypertension.   POSTOPERATIVE DIAGNOSES: 1.  End-stage renal disease.  2.  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  3.  Hypertension.   PROCEDURE:  Ligation of aneurysmal right brachiocephalic arteriovenous fistula with jump graft using a 7 mm Artegraft to the fistula.   SURGEON: Algernon Huxley, MD.   ASSISTANT: Chelsea Haney PA-C.   ANESTHESIA: General.   ESTIMATED BLOOD LOSS: Approximately 50 mL.   INDICATION FOR PROCEDURE: A 59 year old African American male with end-stage renal disease. He has an aneurysmal AV fistula with marked bleeding that had to have a suture ligation repair and he had a PermCath placed.  He is brought in today to try to salvage the access, but to ligate aneurysmal portion of the fistula. Risks and benefits are discussed. Informed consent is obtained.   DESCRIPTION OF PROCEDURE: The patient is brought to the operative suite and after adequate level of general anesthesia obtained, the right upper extremity was sterilely prepped and draped and a sterile surgical field was created. I made transverse incisions overlying the fistula, about 3-4 cm beyond the original anastomosis, prior to aneurysmal portion of the fistula and about 5 cm beyond the aneurysmal fistula in the upper arm cephalic vein.  The fistula was dissected out.  It was then ligated both proximally and distally after we created a tunnel and brought a 7 mm Artegraft on to the field and the patient was heparinized.  The Artegraft was marked for orientation as were the original fistula.  I suctioned out the residual blood within the fistula.  From the distal aspect, after ligating proximally, this was doubly ligated with 2-0 silk on each end.  The Artegraft was then  cut to and beveled to match the proximal fistula and an end to end anastomosis was created with two 6-0 Prolene in a parachuted fashion. The vessel was flushed through the graft with excellent pulsatile inflow. I then cut and beveled the Artegraft to match the distal end of the fistula and an end-to-end anastomosis was created with two 6-0 Prolene sutures in a parachuted fashion, and the vessel was flushed and de-aired prior to releasing control. On release, there was excellent thrill within the fistula and the jump graft.  Pressure was held. Surgicel and Evicel topical hemostatic agents were placed after irrigation, and hemostasis was complete. The wounds were then closed with 3-0 Vicryl and 4-0 Monocryl. Dermabond was placed as a dressing. The patient tolerated the procedure well and was taken to the recovery room in stable condition.    ____________________________ Algernon Huxley, MD jsd:ts D: 12/16/2013 17:35:54 ET T: 12/16/2013 18:32:17 ET JOB#: [id number]  cc: Algernon Huxley, MD, <Dictator> Algernon Huxley MD ELECTRONICALLY SIGNED 01/04/2014 14:09

## 2014-09-17 NOTE — H&P (Signed)
PATIENT NAME:  [name], [name] MR#:  [id number] DATE OF BIRTH:  1955-06-20  DATE OF ADMISSION:  09/15/2013  PRIMARY CARE PHYSICIAN: Ngwe A. Clide Deutscher, MD  REFERRING EMERGENCY ROOM PHYSICIAN: Loney Hering, MD  CHIEF COMPLAINT: Shortness of breath.   HISTORY OF PRESENT ILLNESS: The patient is a 59 year old African-American male with past medical history of end-stage renal disease, on hemodialysis on Monday, Wednesday, Friday, COPD, congestive heart failure, coronary artery disease, hyperlipidemia and hypertension, who is presenting to the ER with a chief complaint of worsening of shortness of breath associated with a dry cough. The patient is reporting that he has been having shortness of breath associated with right-sided pleural effusion for the past 2 months. He reports becoming extremely short of breath 3 times so far in the past 2 months with fluid in the right side of the lung. He is getting his dialysis on a regular basis, and his last dialysis was done as scheduled on Monday. The patient is supposed to get his dialysis today a.m., but he became short of breath and came into the ER. He is complaining of dry cough. Also, the patient is complaining of abdominal distention and could not lie flat. Denies any fever, chills, chest pain, headache or dizziness.   PAST MEDICAL HISTORY:  1. Hypertension.  2. Coronary artery disease.  3. Congestive heart failure.  4. End-stage renal disease, on hemodialysis on Monday, Wednesday and Friday.  5. Hyperlipidemia.   PAST SURGICAL HISTORY:  1. Right arm fistula.  2. Stent placement for coronary artery disease.   PSYCHOSOCIAL HISTORY: Lives at home. Smokes 1 pack in 2 days. Denies alcohol or illicit drug usage.   FAMILY HISTORY: Parents died with complications of heart disease. Sister deceased from massive heart attack.   ALLERGIES: No known drug allergies   REVIEW OF SYSTEMS:  CONSTITUTIONAL: Denies any fever or chills, but complaining of  shortness of breath and generalized weakness.  EYES: No double vision or blurry vision. Denies eye pain.  ENT: Denies postnasal drip, slurred speech or dysphagia.  CARDIOVASCULAR: No chest pain or palpitations. Complaining of orthopnea and leg edema. PULMONARY: Complaining of dry cough, but no phlegm. Complaining of fluid in the right side of the lung, which is getting worse. Denies any wheezing or hemoptysis.  GASTROINTESTINAL: Denies nausea, vomiting, abdominal pain.  GENITOURINARY: No dysuria, hematuria or incontinence.  SKIN: No rashes. No lesions or jaundice.  NEUROLOGIC: Denies vertigo, ataxia, syncope or loss of consciousness.  ENDOCRINE: Denies polyuria, nocturia, heat or cold intolerance.  HEMATOLOGIC: No easy bruising or bleeding.   HOME MEDICATIONS:  1. Symbicort 2 puffs inhalation 2 times a day. 2. Spiriva 18 mcg 1 capsule inhalation once daily.  3. Sodium bicarbonate 650 mg 1 tablet p.o. once daily.  4. Simvastatin 20 mg once daily. 5. Renagel 800 mg 2 tablets p.o. 3 times a day.  6. Nicotine 7 mg patch transdermally once daily.  7. Furosemide 40 mg 2 times a day.  8. Enalapril 10 mg p.o. once daily. 9. Coreg 25 mg 2 times a day. 10. Ceftin 500 mg p.o. 2 times a day.  11. Aspirin 325 mg once daily.   PHYSICAL EXAMINATION: VITAL SIGNS: Temperature 97.4, pulse 81, respirations 18, blood pressure 151/97, pulse oximetry is 98%.  GENERAL APPEARANCE: Not under acute distress. Moderately built and nourished.  HEENT: Normocephalic, atraumatic. Pupils are equally reacting to light and accommodation. No scleral icterus. No conjunctival injection. No sinus tenderness. No postnasal drip. Moist mucous membranes.  NECK: Supple. No JVD. No thyromegaly.  LUNGS: Decreased breath sounds at the right side. Moderate air entry. No wheezing. No crackles.  CARDIAC: S1 and S2 normal. Regular rate and rhythm. No murmurs.  GASTROINTESTINAL: Soft. Bowel sounds are positive in all 4 quadrants.  Nontender, nondistended. No hepatosplenomegaly. No masses.  NEUROLOGIC: Awake, alert and oriented x3. Motor and sensory are grossly intact. Reflexes are 2+.  EXTREMITIES: With trace edema. Right upper extremity with AV fistula with good bruit.  SKIN: Warm to touch. Normal turgor. No rashes. No lesions.  MUSCULOSKELETAL: No joint effusion, tenderness, erythema.  PSYCHIATRIC: Normal mood and affect.   LABORATORY AND IMAGING STUDIES: BNP 145,464. Glucose is normal, BUN 47, creatinine 10.36, sodium and potassium are normal, chloride and CO2 are normal, GFR 6, anion gap is normal, serum osmolality 290, calcium 9.4. LFTs: Albumin is 3.2, AST at 46. The rest of the LFTs are normal. Troponin is 0.57, but it is chronically elevated, and during the last 2 lab works, revealing that the troponin is at 0.55, CPK-MB 3.1. WBC 6.1, hemoglobin and hematocrit are normal, platelets 146, MCV 98. Chest x-ray, portable: Cardiac enlargement with mild pulmonary vascular congestion, increasing right pleural effusion with basilar atelectasis. A 12-lead EKG: Sinus rhythm with PVCs, left axis deviation, T wave inversions are noted in lead I and aVL.   ASSESSMENT AND PLAN: A 59 year old African-American male presenting to the ER with a chief complaint of progressive worsening of shortness of breath with right-sided pleural effusion which is getting worse in the past 2 months. Will be admitted with the following assessment and plan.   1. Acute respiratory distress with worsening of right-sided pleural effusion. Admit him to telemetry. The patient has end-stage renal disease, on hemodialysis. Will get hemodialysis today. Notified Dr. Candiss Norse. If there is no improvement of shortness of breath and pleural effusion, consider CT chest and thoracentesis. Cardiology consult is placed to Dr. Humphrey Rolls, the patient's primary cardiologist.  2. Chronically elevated troponin. The patient is denying chest pain. No intervention is needed at this time.  Will continue his home medications, Coreg and statin.  3. Congestive heart failure. Continue Lasix, which is his home medication.  4. Coronary artery disease.  5. Hypertension. Resume his home medications and up-titrate medications as-needed basis.   CODE STATUS: He is full code.   Plan of care was discussed in detail with the patient, his fiance and his son at bedside. They all verbalized understanding of the plan.   TOTAL TIME SPENT ON ADMISSION: 50 minutes.   ____________________________ Nicholes Mango, MD ag:lb D: 09/15/2013 07:58:34 ET T: 09/15/2013 08:52:50 ET JOB#: [id number]  cc: Nicholes Mango, MD, <Dictator> Ngwe A. Clide Deutscher, MD Murlean Iba, MD Nicholes Mango MD ELECTRONICALLY SIGNED 09/29/2013 4:13

## 2014-09-17 NOTE — Consult Note (Signed)
PATIENT NAME:  [name], THONE MR#:  [id number] DATE OF BIRTH:  Aug 27, 1955  DATE OF CONSULTATION:  07/07/2013  CONSULTING PHYSICIAN:  Dionisio David, MD  INDICATION FOR CONSULTATION: Shortness of breath and LV dysfunction.   HISTORY OF PRESENT ILLNESS: This is a 59 year old African American male with a past medical history of end-stage renal disease, hypertension, hyperlipidemia, CHF, who presented to the Emergency Room with severe shortness of breath, PND, orthopnea. The patient had an echocardiogram which showed ejection fraction of less than 20%, thus I was asked to evaluate the patient. The patient denies any chest pain, tightness or heaviness in the chest but does admit to swelling of the legs, shortness of breath on minimal exertion and worsening of shortness of breath the past couple of days associated with waking up in the middle of the night because of shortness of breath.   SOCIAL HISTORY: He smokes 1 pack per day for the past 10 years. Denies EtOH abuse.   FAMILY HISTORY: Both mother and father had coronary artery disease.   OTHER PAST MEDICAL HISTORY:  He has a history of CHF, history of end-stage renal disease and well-controlled hypertension.   ALLERGIES: None.   MEDICATIONS: Lasix, aspirin, Lopressor, Zocor, sodium bicarbonate and Spiriva.     PHYSICAL EXAMINATION: GENERAL: He is alert, oriented x 3, in no acute distress.  VITAL SIGNS: Blood pressure 150/84, respirations are 18, pulse 79, temperature 97.7 saturation 97. NECK: No JVD.  LUNGS: Clear.  HEART: Regular rate and rhythm. Normal S1, S2. Audible 2/6 systolic murmur at the mitral area.  ABDOMEN: Soft, nontender, positive bowel sounds.  EXTREMITIES: 2+ pedal edema.  NEUROLOGIC: Appears to be intact.   LABORATORY DATA:  EKG shows sinus rhythm, 77 beats per minute, left axis deviation, LVH with nonspecific ST-T changes and poor R wave progression. His echocardiogram shows left ventricular ejection fraction between 20%  to 25%, severely depressed global systolic function, may have wall motion abnormalities, mildly decreased left ventricular cavity. There is a moderate pulmonary hypertension with mild tricuspid regurgitation, mild mitral regurgitation and with pulmonary artery pressure being 56 mmHg. Troponin is 0.55 and then repeat one was 0.53. BUN 80, creatinine 15.92.   ASSESSMENT AND PLAN: Severe left ventricular dysfunction with multiple medical problems including end-stage renal disease, hypertension, hyperlipidemia, diabetes. Never been evaluated for coronary artery disease. Advise changing his medications. We will start the patient on Coreg, ACE inhibitors, hydralazine and since he is getting dialysis, if it would okay with Renal, we will start ACE inhibitor or ARB. He will probably need a LifeVest and evaluation eventually of coronary artery disease.  Right now we will get make him feel better before doing further investigation   ____________________________ Dionisio David, MD sak:dp D: 07/07/2013 15:58:10 ET T: 07/07/2013 16:25:46 ET JOB#: [id number]  cc: Dionisio David, MD, <Dictator> Dionisio David MD ELECTRONICALLY SIGNED 07/09/2013 8:39

## 2014-09-17 NOTE — Op Note (Signed)
PATIENT NAME:  [name], [name] MR#:  [id number] DATE OF BIRTH:  [birth year]  DATE OF PROCEDURE:  11/25/2013  PREOPERATIVE DIAGNOSES:   1.  End-stage renal disease.  2.  Bleeding from right arm arteriovenous fistula requiring suture repair. 3.  Hypertension.  POSTOPERATIVE DIAGNOSES: 1.  End-stage renal disease.  2.  Bleeding from right arm arteriovenous fistula requiring suture repair. 3.  Hypertension.  PROCEDURES: 1.  Ultrasound guidance for vascular access to left internal jugular vein.  2.  Fluoroscopic guidance for placement of catheter.  3.  Placement of a 23 cm tip-to-cuff tunneled hemodialysis catheter via the left internal jugular vein.  4.   Left jugular venogram and superior vena cava gram.  SURGEON:  Algernon Huxley, M.D.   ANESTHESIA: Local with sedation.   BLOOD LOSS: 25 mL.   INDICATION FOR PROCEDURE:  A 59 year old African American male who has a long-standing aneurysmal right brachiocephalic arteriovenous fistula. This morning, he developed life-threatening hemorrhage and required suture repair in the Emergency Room. This will have to heal and he will have to have a dialysis catheter for several days while this either heals or we consider a revision of the right arm access. He is also going to have a fistulogram today and this will be dictated separately. Risks and benefits were discussed. Informed consent was obtained.   DESCRIPTION OF THE PROCEDURE: The patient was brought to the vascular and interventional radiology suite. The patient's left neck and chest were sterilely prepped and draped and a sterile surgical field was created. The left internal jugular vein was visualized with ultrasound and found to be patent. It was then accessed under direct ultrasound guidance and a permanent image was recorded. A wire was placed. After a skin nick and dilatation, the peel-away sheath was placed over the wire. Initially the wire would not pass easily despite multiple attempts and I  elected to do a venogram through the needle access of the left jugular vein. With stable needle access in the left jugular vein, a venogram was performed of the jugular vein and the central venous circulation.  There was found to be some tortuosity, particularly at the clavicle and the left jugular vein, but no significant stenosis, and with this road map, we were able to pass a stiff-angled Glidewire and then exchange for an Amplatz super stiff wire.  This allowed [place] to place the sheath and later the catheter.   I then turned my attention to an area under the clavicle. Approximately 2 fingerbreadths below the clavicle a small counter incision was created and we tunneled from the subclavicular incision to the access site. Using fluoroscopic guidance, a 23 cm tip-to-cuff tunneled hemodialysis catheter was selected, tunneled from the subclavicular incision to the access site. It was then placed through the peel-away sheath and the peel-away sheath was removed. The catheter tips were parked in the right atrium. The appropriate distal connectors were placed. It withdrew blood well and flushed easily with heparinized saline and a concentrated heparin solution was then placed. It was secured to the chest wall with 2 Prolene sutures. The access incision was closed with a single 4-0 Monocryl. A 4-0 Monocryl pursestring suture was placed around the exit site. Sterile dressings were placed.   The patient tolerated the procedure well and was taken to the recovery room in stable condition.     ____________________________ Algernon Huxley, MD jsd:ts D: 11/25/2013 13:49:38 ET T: 11/25/2013 19:23:06 ET JOB#: [id number]  cc: Algernon Huxley, MD, <Dictator> Corene Cornea  Bunnie Domino MD ELECTRONICALLY SIGNED 11/29/2013 15:47

## 2014-09-17 NOTE — Op Note (Signed)
PATIENT NAME:  [name], [name] MR#:  [id number] DATE OF BIRTH:  [birth year]  DATE OF PROCEDURE:  11/25/2013  PREOPERATIVE DIAGNOSES:  1. End-stage renal disease.  2. Severe bleeding from right arm arteriovenous fistula, requiring suture repair.  3. Hypertension.   POSTOPERATIVE DIAGNOSES: 1. End-stage renal disease.  2. Severe bleeding from right arm arteriovenous fistula, requiring suture repair.  3. Hypertension.   PROCEDURES:  1. Ultrasound guidance for vascular access to right brachiocephalic arteriovenous fistula.  2. Right upper extremity fistulogram and central venogram.  3. Percutaneous transluminal angioplasty of subclavian vein/cephalic vein confluence in the chest with a 12 mm diameter angioplasty balloon.  4. Percutaneous transluminal angioplasty of separate and distinct stenosis in the cephalic vein in the proximal upper arm with 12 mm diameter conventional and high-pressure angioplasty balloon.   SURGEON: Algernon Huxley, M.D.   ANESTHESIA: Local with moderate conscious sedation.   BLOOD LOSS: 50 mL.   INDICATION FOR PROCEDURE: A 59 year old African American male with bleeding, which was life threatening from his right arm arteriovenous fistula.  This required suture repair. He has had a PermCath already placed earlier today, which was dictated separately for a stable dialysis access. A fistulogram was performed to evaluate the fistula and evaluate for central venous circulation, which was previously suspected on a noninvasive study.  It could be causing high-pressure. Risks and benefits were discussed. Informed consent was obtained.   DESCRIPTION OF PROCEDURE: The patient was brought to the vascular suite. He has already had his PermCath placed, which will be dictated separately. The fistula was accessed just proximal to the 2 large aneurysms in the distal upper arm.  Under direct ultrasound guidance without difficulty,  the micropuncture needle, micropuncture wire, and sheath  were then placed, and we upsized to a 6, then a 7 [place] sheath. Imaging performed demonstrated a neck and ring-like stenosis in the proximal upper arm cephalic vein, which appeared to be at least 70%-75%. There was then a previously placed stent in the cephalic vein, about 2 cm to the subclavian vein confluence that was patent; however, just below the stent and leading into the subclavian vein confluence, was about a 60%-65% stenosis. The vein was quite dilated. We upsized to the 7 [place] sheath and crossed these lesions without difficulty with a Magic torque wire. I initially treated the cephalic vein/subclavian vein confluence with a 12 mm diameter forcing length angioplasty balloon. This was inflated to profile and the narrowing improved and completion angiogram showed significantly improved flow with about a 25%-30% residual stenosis, which was not flow-limiting. I then treated the separate distinct lesion in the upper arm cephalic vein with the 12 mm diameter angioplasty balloon; however, the narrowing would not break at burst pressure. We had to exchange for a high-pressure angioplasty balloon and inflate this to 20 atmospheres. Following this, there was significantly improved flow with only mild residual stenosis of about 10%-15%, and I elected to terminate the procedure. The sheath was removed, 4-0 Monocryl pursestring suture was placed. Pressure was held. Sterile dressing was placed. The patient tolerated the procedure well and was taken to the recovery room in stable condition.     ____________________________ Algernon Huxley, MD jsd:ts D: 11/25/2013 13:45:50 ET T: 11/25/2013 19:10:41 ET JOB#: [id number]  cc: Algernon Huxley, MD, <Dictator> Algernon Huxley MD ELECTRONICALLY SIGNED 11/29/2013 15:46

## 2014-09-17 NOTE — H&P (Signed)
PATIENT NAME:  [name], MARLATT MR#:  [id number] DATE OF BIRTH:  1956/03/21  DATE OF ADMISSION:  07/06/2013  PRIMARY CARE PHYSICIAN: Dr. Clide Deutscher  REFERRING PHYSICIAN: Dr. Jacqualine Code  CHIEF COMPLAINT: Worsening shortness of breath, cough, and leg edema for 1 month.   HISTORY OF PRESENT ILLNESS: A 59 year old African American male with a history of ESRD, hypertension, hyperlipidemia, CHF, and CAD who presented to the ED with worsening shortness of breath and leg edema for 1 month. The patient said he has had shortness of breath and leg edema on and off for a long time, but the symptoms have been worsening for the past 1 month. He has worsening shortness of breath, dry cough, and leg edema increased up to thigh. In addition, the patient has abdominal distention and edema. The patient also complains of orthopnea and nocturnal dyspnea. The patient is an ESRD patient, but still has urination. The patient denies any weight gain but has weight loss. He denies any fever or chills, but has headache and dizziness.   PAST MEDICAL HISTORY: Hypertension, CAD, CHF, ESRD, hyperlipidemia.   PAST SURGICAL HISTORY: Right arm fistula surgery and stent placement for CAD in 2011.   SOCIAL HISTORY: Smokes 1 pack per 2  days for many years. Denies any alcohol drinking or illicit drugs.   FAMILY HISTORY: Parents deceased. They died from complication of heart disease. One sister died of massive heart attack.   ALLERGIES: None.   HOME MEDICATIONS: 1.  Symbicort 160 mcg/4.5 mcg inhalation 2 puffs twice a day.  2.  Spiriva 18 mcg 1 cap inhaled once a day.  3.  Sodium bicarbonate 650 mg p.o. tablets 1 tablet once a day. 4.  Zocor 20 mg p.o. daily.  5.  Renagel 800 mg p.o. 2 tablets t.i.d.  6.  ProAir HFA CFC free 90 mcg inhalation 2 puffs 4 times a day.  7.  Lopressor 25 mg p.o. b.i.d.  8.  Lasix 40 mg p.o. b.i.d.  9.  Aspirin 325 mg p.o. once a day.   REVIEW OF SYSTEMS: CONSTITUTIONAL: The patient denies any fever or  chills, but has headache and dizziness. No weight gain or has some weight loss. Also has generalized weakness.  EYES: No double or blurred vision.  ENT: No postnasal drip, slurred speech, or dysphagia.  CARDIOVASCULAR: No chest pain or palpitations, but has orthopnea, nocturnal dyspnea, and leg edema.  PULMONARY: Positive for cough, but no sputum. The patient has shortness of breath, but no wheezing or hemoptysis.  GASTROINTESTINAL: No abdominal pain, nausea, or vomiting, but has diarrhea, 3 days, loose and watery. No melena or bloody stool.  GENITOURINARY: No dysuria, hematuria, or incontinence.  SKIN: No rash or jaundice.  NEUROLOGY: No syncope, loss of consciousness, or seizure.  ENDOCRINOLOGY: No polyuria, polydipsia, heat or cold intolerance.  HEMATOLOGY: No easy bruising, bleeding.   PHYSICAL EXAMINATION: VITAL SIGNS: Temperature 97, blood pressure 139/84, pulse 74, O2 saturation 100% in room air.  GENERAL: The patient is alert, awake, and oriented, in no acute distress.  HEENT: Pupils round, equal and reactive to light and accommodation.  NECK: Supple. No JVD or carotid bruits. No lymphadenopathy. No thyromegaly.  CARDIOVASCULAR: S1 and S2 regular rate and rhythm. Mild JVD. No carotid bruit. No lymphadenopathy. No thyromegaly.  CARDIOVASCULAR: S1 and S2 regular rate and rhythm. No murmurs or gallops.  PULMONARY: Bilateral air entry. No wheezing but has mild crackles on both sides, weak breath sounds in bilateral bases. No use of accessory muscles to breathe.  ABDOMEN: Soft, no tenderness, but distention. Bowel sounds present. Ascites sign positive. Difficult to tell whether the patient has organomegaly.  SKIN: No rash or jaundice.  EXTREMITIES: Bilateral leg edema, 2+. No clubbing or cyanosis. No calf tenderness. Bilateral pedal pulses present.  SKIN: No rash or jaundice.  NEUROLOGIC: Alert and oriented x3. No focal deficit. Power 5 out of 5. Sensation intact.   LABORATORY AND  DIAGNOSTICS: Chest x-ray: Severe cardiomyopathy with persistent small to moderate right pleural effusion, underlying air space opacity or consolidation.   CAT scan of chest showed third spacing of fluid with large right pleural effusion, upper abdominal ascites, subcutaneous mesenteric edema, cardiomyopathy, and a 3 mm stable pulmonary nodule on the right upper lobe.   CK 276, CK-MB 2.8. Glucose 115, BUN 80, creatinine 15.92, sodium 136, potassium 4.9, chloride 102, bicarb 24. WBC 4.4, hemoglobin 13.4, platelets 107. Troponin 0.55. BNP more than 175,000.  EKG shows normal sinus rhythm at 77 bpm with left ventricular hypertrophy.   IMPRESSIONS: 1.  Fluid overload.  2.  Acute on chronic congestive heart failure.  3.  Pleural effusion. 4.  Ascites.  5.  Elevated troponin possibly due to congestive heart failure/endstage renal disease. 6.  End-stage renal disease.  7.  Coronary artery disease.  8.  Hypertension, controlled.  9. Pulmonary nodule.  PLAN OF TREATMENT: 1.  The patient will be admitted to telemetry floor. We will start IV Lasix. Follow up with nephrology for hemodialysis. The patient may need thoracentesis. 2.  Follow up troponin level, but hold aspirin for possible thoracentesis. We will check PT and PTT.  3.  Continue Lopressor.  4.  Continue Zocor. 5.  Smoking cessation. Was counseled for 5 minutes. We will give a nicotine patch.   CODE STATUS: The patient wants FULL code.   I discussed the patient's condition and plan of treatment with the patient and the patient's sisters.  Discussed with Dr. Candiss Norse, per Dr. Candiss Norse, patient refused to get dialysis today, cont lasix and get dialysis tomorrow.  TIME SPENT: About 62 minutes.  ____________________________ Demetrios Loll, MD qc:sb D: 07/06/2013 11:53:09 ET T: 07/06/2013 12:26:59 ET JOB#: [id number]  cc: Demetrios Loll, MD, <Dictator> Demetrios Loll MD ELECTRONICALLY SIGNED 07/06/2013 16:12

## 2014-09-18 NOTE — Consult Note (Signed)
Brief Consult Note: Diagnosis: +tn, hypotension with known CAD and prior RCA DES 11/11, ?LVEF  recenet echo [facility]"something wrong with right side of heart, ESRD on HD.   Patient was seen by consultant.   Consult note dictated.   Recommend to proceed with surgery or procedure.   Recommend further assessment or treatment.   Comments: tn elevation nonspecific in esrd Pt>>serial Ez need echo report from [facility]??whats wsrong with Right side of heart and why is HGB elevated decrease ASA 325>>81 Will recview need for ongoing Effient will follow.  Electronic Signatures: Deboraha Sprang (MD)  (Signed 08-Mar-13 13:06)  Authored: Brief Consult Note   Last Updated: 08-Mar-13 13:06 by Deboraha Sprang (MD)

## 2014-09-18 NOTE — H&P (Signed)
PATIENT NAME:  [name], COUSIN MR#:  [id number] DATE OF BIRTH:  [birth year]  DATE OF ADMISSION:  08/02/2011  PRIMARY CARE PHYSICIAN: [facility]    CHIEF COMPLAINT: Hypotension and chest pain.   HISTORY OF PRESENT ILLNESS: This is a 59 year old male who comes in from home due to complaining that his blood pressure has been running low. The patient said that his blood pressure usually runs around the 120's; for the past few days it's been running as low as the 90's. He has also been feeling a little bit dizzy and has been having some intermittent sharp chest pains. He denies any shortness of breath with the chest pain. Denies any nausea, vomiting, diaphoresis, any syncope, any palpitations. He also denies any exertional shortness of breath or any paroxysmal nocturnal dyspnea. When the patient was brought to the hospital, he was noted to have blood pressures in the 90's. The patient still complained of some dizziness and was noted to have slightly elevated troponins. Hospitalist services were then contacted for further treatment and evaluation.   REVIEW OF SYSTEMS: CONSTITUTIONAL: No documented fever. No weight gain, no weight loss. EYES: No blurry or double vision. ENT: No tinnitus, no postnasal drip, no redness of the oropharynx. RESPIRATORY: No cough, no wheeze, no hemoptysis, no dyspnea. CARDIOVASCULAR: Positive chest pain. No orthopnea, no palpitations, no syncope. GI: No nausea, no vomiting, no diarrhea, no abdominal pain, no melena, no hematochezia. GU: No dysuria, no hematuria. ENDOCRINE: No polyuria or nocturia. No heat or cold intolerance. HEME: No anemia, no bruising, no bleeding. INTEGUMENTARY: No rashes, no lesions. MUSCULOSKELETAL: No arthritis, no swelling, no gout. NEUROLOGIC: No numbness, no tingling, no ataxia, no seizure-type activity. PSYCH: No anxiety, no insomnia, no ADD.   PAST MEDICAL HISTORY:  1. End-stage renal disease, on  hemodialysis. 2. Hypertension. 3. Hyperlipidemia. 4. History of coronary disease, status post stent placement in 2011.   ALLERGIES: No known drug allergies.   SOCIAL HISTORY: He does smoke about a pack to a pack and a half every three days, long-time smoker. No alcohol abuse. No illicit drug abuse. Lives at home by himself.   FAMILY HISTORY: Both mother and father are deceased. They died from complications of heart disease. He had a sister who just died from a massive heart attack last year.   CURRENT MEDICATIONS:  1. Aspirin 325 mg daily.  2. Effient 10 mg daily.  3. Lasix 40 mg b.i.d.  4. Metoprolol tartrate 25 mg b.i.d.  5. Nephro-Vite 1 tab daily.  6. Renagel 800 mg 2 tabs t.i.d. with meals.  7. Simvastatin 20 mg daily.   PHYSICAL EXAMINATION ON ADMISSION:   VITAL SIGNS: Temperature 97.2, pulse 60, respirations 16, blood pressure 122/83, sats 100% on room air.   GENERAL: He is a pleasant appearing male in no apparent distress.   HEENT: He is atraumatic, normocephalic. His extraocular muscles are intact. His pupils are equal and reactive to light. Sclerae anicteric. No conjunctival injection. No oropharyngeal erythema.   NECK: Supple. No jugular venous distention, no bruits, no lymphadenopathy, no thyromegaly.   HEART: Regular rate and rhythm. No murmurs, no rubs, no clicks.   LUNGS: Clear to auscultation bilaterally. No rales, no rhonchi, no wheezes.   ABDOMEN: Soft, flat, nontender, nondistended. Has good bowel sounds. No hepatosplenomegaly appreciated.   EXTREMITIES: There is no evidence of any cyanosis, clubbing, or peripheral edema. He has +2 pedal and radial pulses bilaterally. He does have a right upper extremity dialysis fistula which has a  good bruit and good thrill with no evidence of any drainage.   SKIN: Moist and warm with no rashes.   LYMPHATIC: There is no cervical or axillary lymphadenopathy.  LABORATORY, DIAGNOSTIC, AND RADIOLOGICAL DATA: On admission  serum glucose 101, BUN 41, creatinine 8.4, sodium 139, potassium 4.6, chloride 97, bicarb 28. LFTs are within normal limits. Troponin 0.14. Total CK 685. CK-MB 6.2. White cell count 6, hemoglobin 15.2, hematocrit 47.5, platelet count 212. Urinalysis is essentially within normal limits.   The patient did have an EKG done which showed normal sinus rhythm with left axis deviation and voltage criteria consistent with LVH.   ASSESSMENT AND PLAN: This is a 59 year old male with past medical history of end-stage renal disease on hemodialysis, hypertension, hyperlipidemia, history of coronary artery disease status post stent placement who presents to the hospital due to his blood pressure running on the low side and also noted to have atypical chest pain with elevated troponin.  1. Chest pain with elevated troponin. The patient has very atypical and typical features to his chest pain. Some of his pain is reproducible but he has been having intermittent nonexertional chest pain for months. He does have a slightly elevated troponin and does have risk factors given his previous history of CAD and stent placement. I will observe him overnight on telemetry. Check serial cardiac markers. Continue aspirin, beta-blocker, statin, nitroglycerin, oxygen, and Effient. Will get a Cardiology consult to further evaluate him. The patient has seen Dr. Clayborn Bigness in the past and also Dr. Rockey Situ. I will consult either one of them.  2. Hypotension. The patient has had orthostatics checked. They are negative. This is likely related to his medications. I will lower the dose of his Lasix and also his metoprolol for now and follow his hemodynamics.  3. Hypertension. As mentioned, due to his hypotension I have lowered his dose of Lasix and metoprolol and will continue to follow his blood pressure.  4. Hyperlipidemia. Continue with simvastatin.   CODE STATUS: The patient is a FULL CODE.   TIME SPENT: 50 minutes.   ____________________________ Belia Heman. Verdell Carmine, MD vjs:drc D: 08/02/2011 11:06:39 ET T: 08/02/2011 11:24:54 ET JOB#: [id number] cc: Belia Heman. Verdell Carmine, MD, <Dictator>, [place] MD ELECTRONICALLY SIGNED 08/02/2011 13:54

## 2014-09-18 NOTE — Consult Note (Signed)
PATIENT NAME:  [name], [name] MR#:  [id number] DATE OF BIRTH:  Jul 24, 1955  DATE OF CONSULTATION:  08/02/2011  REASON FOR CONSULTATION: Thank you very much for asking [place] to see [name] in consultation because of an elevated troponin.   HISTORY: The gentleman is a 59 year old man with end-stage renal disease on dialysis x5 years with a history of hypertension. He also has a history of ischemic heart disease, having presented in 2011. At that time, he had borderline elevated troponins; he underwent catheterization and was found to have a 90% RCA lesion and underwent drug-eluting stenting. He was put on Effient and has been on that since then.   He does not have exertional chest pain, peripheral edema, or nocturnal dyspnea or dyspnea on exertion. He came to the hospital on this occasion because his blood pressures have been in the 90s for the last week or two. This is not new, but what is new is that it has not equilibrated over the hours following his dialysis. It has been associated with some lightheadedness and fatigue.   He was recently seen in [facility], within the last month or so, and had an echocardiogram that demonstrated "a problem with the right side of my heart." No specifics are available. No estimation of left ventricular function was available in the old chart at [facility] that I could find.   REVIEW OF SYSTEMS: His review of systems is as noted on the H and P and is broadly negative apart from point chest wall tenderness.   PAST MEDICAL HISTORY: His past medical history, in addition to the above, is notable for dyslipidemia.   SOCIAL HISTORY: He smokes. He lives by himself. He was recently released from incarceration.   OUTPATIENT MEDICATIONS:  1. Effient 10 mg. 2. Aspirin 325 mg. 3. Lasix 40 mg b.i.d.  4. Metoprolol 25 mg b.i.d.  5. Simvastatin 20 mg.   ALLERGIES: He has no known drug allergies.   FAMILY HISTORY: His family history is notable for coronary artery disease.    PHYSICAL EXAMINATION:  GENERAL: He is a middle-aged, African American male appearing somewhat older than his stated age of 56.   VITAL SIGNS: His blood pressure was 122/83, his pulse was 60s, respirations were 18 and unlabored.   HEENT: Notable for poor dentition but was otherwise normal.   NECK: His neck veins were flat. His carotids were brisk and full bilaterally without bruits.   BACK: Without kyphosis or scoliosis.   LUNGS: Clear. There was no CVA tenderness.   HEART: Heart sounds were regular. There were no murmurs or gallops. There was a continuous to and fro sound in the right upper chest, likely related to the bruit generated from his right AV fistula in his arm.   ABDOMEN: Soft with active bowel sounds.   EXTREMITIES: No edema.   NEUROLOGIC: Grossly normal.   SKIN: The skin was warm and dry.   EXTREMITIES: Notable for the grafts in the right upper arm.   NEUROLOGICAL: Exam was grossly normal.   LABORATORY, DIAGNOSTIC AND RADIOLOGICAL DATA:  Electrocardiogram is described as showing left axis deviation with left ventricular hypertrophy. It is not available for review.  Laboratories were notable for troponin of 0.14, hemoglobin of 15.2, CK 685 with an MB fraction of 6.2.   IMPRESSION:  1. Elevated troponin.  2. Coronary artery disease with prior TES PCI of his RCA in November 2011 with residual LAD disease. He has been on Effient since, no estimation of LV function  is known.  3. ? something wrong with the right side of his heart.  4. Relatively elevated hemoglobin in a patient with renal insufficiency.  5. Hypotension with a history of hypertension.   DISCUSSION: [name] has a borderline elevated troponin in the setting of renal insufficiency. I think, given his MB fraction, that this is likely nonspecific. Serial measurements might be of help, but we will be hard-pressed to implicate an acute coronary syndrome with these levels of troponin given his renal  insufficiency. It is not unreasonable to undertake a Myoview scan; however, although I am not sure what his pretest likelihoods are given his symptoms being relatively nonspecific.   I am concerned about the observation made on his echo in [place]. This, in conjunction with the fact that his hemoglobin is 15 in a patient with renal insufficiency, makes me wonder whether he is relatively hypoxic or something as a cause of that. Repeat ultrasound would be of value except that I think he wants to transfer his care towards the [place] area and, as such, we should try and get the records from [facility] prior to undertaking further echo imaging.   In the interim, then, I would: 1. Continue to exclude cardiac enzymes.  2. Arrange for outpatient stress testing next week.  3. At that point, we can consider the discontinuation of his Effient.  4. Decrease his aspirin from 325 to 81 mg in the interim.   Thank you for the consultation. ____________________________ Deboraha Sprang, MD sck:cbb D: 08/02/2011 13:01:57 ET T: 08/02/2011 13:58:59 ET JOB#: [id number] cc: Deboraha Sprang, MD, <Dictator> [place] MD ELECTRONICALLY SIGNED 09/09/2011 21:55

## 2014-09-18 NOTE — Op Note (Signed)
PATIENT NAME:  ANEEL, STEFANELLI MR#:  [id number] DATE OF BIRTH:  1955-12-03  DATE OF CONSULTATION:  08/02/2011  REASON FOR CONSULTATION: Thank you very much for asking [place] to see Rella Larve in consultation because of an elevated troponin.   HISTORY: The gentleman is a 59 year old man with end-stage renal disease on dialysis x5 years with a history of hypertension. He also has a history of ischemic heart disease, having presented in 2011. At that time, he had borderline elevated troponins; he underwent catheterization and was found to have a 90% RCA lesion and underwent drug-eluting stenting. He was put on Effient and has been on that since then.   He does not have exertional chest pain, peripheral edema, or nocturnal dyspnea or dyspnea on exertion. He came to the hospital on this occasion because his blood pressures have been in the 90s for the last week or two. This is not new, but what is new is that it has not equilibrated over the hours following his dialysis. It has been associated with some lightheadedness and fatigue.   He was recently seen in [facility], within the last month or so, and had an echocardiogram that demonstrated "a problem with the right side of my heart." No specifics are available. No estimation of left ventricular function was available in the old chart at [facility] that I could find.   REVIEW OF SYSTEMS: His review of systems is as noted on the H and P and is broadly negative apart from point chest wall tenderness.   PAST MEDICAL HISTORY: His past medical history, in addition to the above, is notable for dyslipidemia.   SOCIAL HISTORY: He smokes. He lives by himself. He was recently released from incarceration.   OUTPATIENT MEDICATIONS:  1. Effient 10 mg. 2. Aspirin 325 mg. 3. Lasix 40 mg b.i.d.  4. Metoprolol 25 mg b.i.d.  5. Simvastatin 20 mg.   ALLERGIES: He has no known drug allergies.   FAMILY HISTORY: His family history is notable for coronary artery disease.    PHYSICAL EXAMINATION:  GENERAL: He is a middle-aged, African American male appearing somewhat older than his stated age of 89.   VITAL SIGNS: His blood pressure was 122/83, his pulse was 60s, respirations were 18 and unlabored.   HEENT: Notable for poor dentition but was otherwise normal.   NECK: His neck veins were flat. His carotids were brisk and full bilaterally without bruits.   BACK: Without kyphosis or scoliosis.   LUNGS: Clear. There was no CVA tenderness.   HEART: Heart sounds were regular. There were no murmurs or gallops. There was a continuous to and fro sound in the right upper chest, likely related to the bruit generated from his right AV fistula in his arm.   ABDOMEN: Soft with active bowel sounds.   EXTREMITIES: No edema.   NEUROLOGIC: Grossly normal.   SKIN: The skin was warm and dry.   EXTREMITIES: Notable for the grafts in the right upper arm.   NEUROLOGICAL: Exam was grossly normal.   LABORATORY, DIAGNOSTIC AND RADIOLOGICAL DATA:  Electrocardiogram is described as showing left axis deviation with left ventricular hypertrophy. It is not available for review.  Laboratories were notable for troponin of 0.14, hemoglobin of 15.2, CK 685 with an MB fraction of 6.2.   IMPRESSION:  1. Elevated troponin.  2. Coronary artery disease with prior TES PCI of his RCA in November 2011 with residual LAD disease. He has been on Effient since, no estimation of LV function  is known.  3. ? something wrong with the right side of his heart.  4. Relatively elevated hemoglobin in a patient with renal insufficiency.  5. Hypotension with a history of hypertension.   DISCUSSION: Mr. Westenhaver has a borderline elevated troponin in the setting of renal insufficiency. I think, given his MB fraction, that this is likely nonspecific. Serial measurements might be of help, but we will be hard-pressed to implicate an acute coronary syndrome with these levels of troponin given his renal  insufficiency. It is not unreasonable to undertake a Myoview scan; however, although I am not sure what his pretest likelihoods are given his symptoms being relatively nonspecific.   I am concerned about the observation made on his echo in [place]. This, in conjunction with the fact that his hemoglobin is 15 in a patient with renal insufficiency, makes me wonder whether he is relatively hypoxic or something as a cause of that. Repeat ultrasound would be of value except that I think he wants to transfer his care towards the [place] area and, as such, we should try and get the records from [facility] prior to undertaking further echo imaging.   In the interim, then, I would: 1. Continue to exclude cardiac enzymes.  2. Arrange for outpatient stress testing next week.  3. At that point, we can consider the discontinuation of his Effient.  4. Decrease his aspirin from 325 to 81 mg in the interim.   Thank you for the consultation. ____________________________ Deboraha Sprang, MD sck:cbb D: 08/02/2011 13:01:57 ET T: 08/02/2011 13:58:59 ET JOB#: [id number] cc: Deboraha Sprang, MD, <Dictator> [place]

## 2014-09-18 NOTE — Discharge Summary (Signed)
PATIENT NAME:  [name], RAGIN MR#:  [id number] DATE OF BIRTH:  1955/08/16  DATE OF ADMISSION:  08/02/2011 DATE OF DISCHARGE:  08/03/2011  PRIMARY CARE PHYSICIAN: [facility].   REASON FOR ADMISSION: Hypotension and chest pain.   DISCHARGE DIAGNOSES:  1. Chest pain with elevated troponin felt to be from poor renal clearance in the setting of end-stage renal disease rather than acute coronary syndrome with exact etiology of chest pain unclear and some of his pain is also reproducible.  2. History of coronary artery disease status post coronary stenting.  3. Hypotension which was symptomatic with dizziness, felt to be medication induced. 4. Urinary tract infection based off abnormal urinalysis. 5. End stage renal disease. The patient is on hemodialysis every Tuesday, Thursday, and Saturday.  6. History of hyperlipidemia. 7. History of tobacco abuse. 8. History of hypertension.   CONSULTATIONS:  1. Cardiology with Dr. Virl Axe.  2. Nephrology with Dr. Anthonette Legato.   DISCHARGE DISPOSITION: Home.   DISCHARGE MEDICATIONS:  1. Aspirin 81 mg daily.  2. Nitrostat 0.4 mg sublingually every five minutes x3 p.r.n. chest pain.  3. Levaquin 250 mg p.o. q.48 x5 days.  4. Metoprolol 12.5 mg p.o. b.i.d.  5. Lasix 20 mg p.o. b.i.d.  6. Renagel 800 mg 2 tablets p.o. t.i.d.  7. Simvastatin 20 mg p.o. daily. 8. Effient 10 mg p.o. daily.  9. Nephro-Vite 1 tab p.o. daily.   DISCHARGE DIET: Low sodium, low fat, low cholesterol.   DISCHARGE ACTIVITY: As tolerated, but no exertional activity. No heavy lifting until further advised by your cardiologist.   DISCHARGE CONDITION: Improved, stable.   DISCHARGE INSTRUCTIONS:  1. Take medications as prescribed. 2. Return to the Emergency Department for recurrence of symptoms.  FOLLOWUP INSTRUCTIONS:  1. Followup with [facility] (Dr. Caryl Comes) within one week.  2. Followup with your primary care physician at the [facility]  within one to two weeks.  3. Followup with your nephrologist within one to two weeks.  4. Resume your usual outpatient hemodialysis schedule starting Tuesday, 08/06/2011.   LABORATORY, DIAGNOSTIC AND RADIOLOGICAL DATA: Chest x-ray PA and lateral 08/02/2011: No acute cardiopulmonary abnormalities are noted. Cardiac enzymes on admission: CK 685, CK-MB 6.2, troponin 0.14. Second set CK 573, CK-MB 5.6, troponin 0.13. Third set CK 532, CK-MB 5.1, troponin 0.14. CBC normal on admission. Urinalysis 08/02/2011 cloudy urine with 1+ leukocyte esterase, negative nitrite, 20 WBCs and trace bacteria EKG on 08/02/2011 normal sinus rhythm, heart rate 66 beats per minute with left axis deviation. The patient was noted to have left ventricular hypertrophy.   BRIEF HISTORY/HOSPITAL COURSE: The patient is a 59 year old male with past medical history of hypertension, hyperlipidemia, coronary artery disease status post stent, end-stage renal disease who presented to the Emergency Department secondary to chest pain and his blood pressure was also running low. Please see dictated admission history and physical for pertinent details surrounding the onset of the hospitalization. Please see below for further details.  1. Chest pain, with elevated troponin. The patient's chest pain was quite atypical and some was also reproducible, but he had also been having intermittent nonexertional chest pain over the past few months. He was noted to have elevated CK, CK-MB, and troponin levels at the time of admission. He was initially maintained on oxygen, aspirin, Effient, beta blocker, nitroglycerin and statin therapy. He was also given p.r.n. pain control. With all these measures, the patient's chest pain had eventually resolved. Cardiology was consulted and Dr. Caryl Comes felt the  patient's chest pain was nonspecific and cardiology recommended continuing medical management of the patient's coronary artery disease with aspirin (but decreasing dose  to 81 mg per day) and also to continue Effient, beta blocker and statin therapy, in addition to p.r.n. Nitrostat. His beta blocker has been reduced given symptomatic hypotension with dizziness. Cardiology recommends outpatient stress testing and the patient will follow-up with Dr. Caryl Comes in this regard. The patient was discharged home chest pain free. His troponin elevation was felt to be from poor renal clearance in the setting of end-stage renal disease rather than acute coronary syndrome per cardiology. However, his CK and CK-MB levels were also elevated. He will need further evaluation as an outpatient with Myoview per cardiology and it was felt to be safe to have the patient discharged home for outpatient Myoview per Dr. Caryl Comes.  2. Hypotension, which was symptomatic with dizziness. This was felt to be medication induced and the patient was not orthostatic. The patient's metoprolol dose has been reduced. Although the patient has end-stage renal disease, he still makes urine and his Lasix dose has also been reduced. Hypotension and dizziness thereafter resolved and blood pressure is well controlled on the day of discharge. 3. Urinary tract infection, based off abnormal urinalysis for which the patient has been started on empiric Levaquin therapy which was renally dose and he is recommended five days treatment and he is nontoxic appearing and afebrile and without leukocytosis and did not appear septic. He was advised to return to the ER if he develops fevers, chills, or if his condition worsens.  4. Endstage renal disease, on hemodialysis every Tuesday, Thursday and Saturday. The patient was seen by nephrology and nephrology did not feel that the patient required inpatient dialysis since he was being discharged the day after admission and recommended the patient to resume his outpatient dialysis at [facility] (which was arranged with the patient's nurse, Kenney Houseman) with next dialysis  08/06/2011. The patient was agreeable to this.  5. Hyperlipidemia. The patient was maintained on statin therapy.  6. Tobacco abuse. The patient was strongly counseled on the importance of smoking cessation.  7. History of hypertension. Medications have been adjusted given symptomatic hypotension with dizziness and after reducing both his Lasix and metoprolol dose as above, his symptoms and the hypotension have resolved.   On 08/03/2011 the patient was hemodynamically stable and without any chest pain or hypotension and was felt to be stable for discharge home with close outpatient follow-up to which the patient was agreeable.     TIME SPENT ON DISCHARGE: Greater than 30 minutes.   ____________________________ Romie Jumper, MD knl:ap D: 08/07/2011 21:25:25 ET T: 08/08/2011 14:07:27 ET JOB#: [id number]  cc: Romie Jumper, MD, <Dictator> [facility] Deboraha Sprang, MD Romie Jumper MD ELECTRONICALLY SIGNED 08/13/2011 1:17

## 2014-09-25 NOTE — Op Note (Signed)
PATIENT NAME:  [name], [name] MR#:  [id number] DATE OF BIRTH:  10-Mar-1956  DATE OF PROCEDURE:  08/29/2014  PREOPERATIVE DIAGNOSES:  1.  Peripheral arterial disease with ulceration and gangrenous changes to toes on the right foot.  2.  End-stage renal disease.  3.  Status post below-knee amputation on the left.  4.  Heart disease.   POSTOPERATIVE DIAGNOSES:  1.  Peripheral arterial disease with ulceration and gangrenous changes to toes on the right foot.  2.  End-stage renal disease.  3.  Status post below-knee amputation on the left.  4.  Heart disease.   PROCEDURES:  1.  Ultrasound guidance for vascular access to left femoral artery.  2.  Catheter placement to right anterior tibial artery, peroneal artery and posterior tibial arteries from left femoral approach.  3.  Aortogram and selective right lower extremity angiogram.  4.  Percutaneous transluminal angioplasty of right popliteal artery with 6 mm diameter drug-coated angioplasty balloon.  5.  Percutaneous transluminal angioplasty of distal right anterior tibial artery and dorsalis pedis artery with 2.5 mm diameter angioplasty balloon.  6.  Percutaneous transluminal angioplasty of right peroneal artery with 2.5 mm diameter angioplasty balloon.  7.  Percutaneous transluminal angioplasty of proximal and mid right posterior tibial artery with 2 mm diameter angioplasty balloon.  8.  StarClose closure device of left femoral artery.   SURGEON: Algernon Huxley, MD  ANESTHESIA: Local with moderate conscious sedation.   ESTIMATED BLOOD LOSS: 25 mL.   INDICATION FOR PROCEDURE: This is a gentleman whom we have previously treated for dialysis access needs. He was last seen almost a year ago. He presented to our office last week with ulceration on 2 toes on the right foot and dark discoloration worrisome for gangrenous changes on his 2nd and 3rd toes as well.  Given his previous history of below-knee amputation for peripheral disease and  ulceration on the left, he sought treatment quickly.  Given his multiple atherosclerotic risk factors and his poorly palpable pedal pulses, he was brought in for angiography for further evaluation and potential treatment. Risks and benefits were discussed. Informed consent was obtained.   DESCRIPTION OF PROCEDURE: The patient is brought to the vascular suite.  Groins were shaved and prepped and a sterile surgical field was created. The left femoral artery was visualized with ultrasound and found to be widely patent. It was then accessed under direct ultrasound guidance without difficulty with a Seldinger needle. A J-wire and 5 French sheath were placed. Pigtail catheter was placed into the aorta and an AP aortogram was performed. This demonstrated the flow-limiting stenosis in the aortoiliac segments with a very slow circulation time consistent with significant congestive heart failure.  The renal arteries appeared to be patent, but the flow into the kidneys was sluggish which was expected for someone with long-standing end-stage renal disease. I then crossed the aortic bifurcation and advanced to the right femoral head. Selective right lower extremity angiogram was then performed.  I had to advance into the mid to distal superficial femoral artery to opacify the tibials due to his poor cardiac output and slow circulation time.  The common femoral artery, profunda femoris artery, superficial femoral artery were widely patent without significant stenosis.  The popliteal artery just above the level of the knee had a 65% stenosis. He then had a normal tibial trifurcation. The anterior tibial artery was his best runoff distally. However, in the distal anterior tibial artery down in the dorsalis pedis artery there were 2  areas of stenosis in the 70 to 80% range within about 8 to 10 cm of one another.  The peroneal artery was small and stenotic over a long segment proximally. The posterior tibial artery is diffusely  diseased and small. I heparinized the patient, put a 6 [place] Ansell sheath over a Terumo advantage wire. I initially turned my attention to the anterior tibial artery.  With a 135 angled CXI catheter and a V-18 wire, I was able to cross the stenosis and parked a wire into the foot. The dorsalis pedis artery just below the ankle and the distal anterior tibial artery were then treated with a 2.5 mm x 22 cm length angioplasty balloon inflated to about 12 atmospheres. The balloon was deflated and the catheter was parked back in the anterior tibial artery and selective imaging of the anterior tibial artery was performed. This demonstrated markedly improved flow with no more than 10 to 15% residual stenosis in the 2 areas of stenosis in the anterior tibial artery. We had now provided in-line flow to his foot, but with the very poor cardiac output, renal failure and other comorbidities I attempted to optimize the perfusion in all 3 tibial vessels. I then turned my attention to the peroneal artery.  I gained access to it with a CXI catheter and the V-18 wire.  Selective imaging was performed of the peroneal artery. I then replaced the wire into the distal peroneal artery and used the 2.5 x 22 cm long balloon in the proximal and mid peroneal artery up through the tibioperoneal trunk. Again, narrowing was seen particularly in the proximal peroneal artery which resolved with angioplasty at about 10 to 12 atmospheres. The balloon was deflated and the angiogram was performed which showed good in-line flow without significant residual stenosis of more than 30% identified. I then turned my attention to the posterior tibial artery. I recannulated this with the CXI catheter and the V-18 wire.  Selective imaging was performed through the CXI catheter and this was diffusely diseased. I was able to get a wire into the distal posterior tibial artery just above the ankle. This vessel was smaller and I downsized to a 2 mm diameter  angioplasty balloon.  This would only advance to the mid posterior tibial artery in the mid calf. There was a calcified occlusion at this point that even our smallest balloons would not get past.  Nonetheless, I went ahead and ballooned the proximal and mid posterior tibial arteries which did appear to improve the collateralization and the flow to this level.  The distal posterior tibial artery below the ankle and into the foot was occluded without distal targets. I did not think further attempts were likely to be fruitful and we treated this proximally.  I then turned my attention to the popliteal artery.  I re-exchanged for the 0.035 Advantage wire. A 6 mm diameter x 4 cm length Lutonix drug-coated angioplasty balloon was inflated in the popliteal artery at and just above the level of the knee. Narrowing was seen.  The balloon inflation was taken to 10 atmospheres and held for 1 minute. The balloon was then deflated and completion angiogram showed improved flow with about a 15 to 20% residual stenosis which was not flow-limiting.   At this point, I elected to terminate the procedure. The sheath was removed. StarClose closure device was deployed in the usual fashion with excellent hemostatic result. The patient tolerated the procedure well and was taken to the recovery room in stable condition.  ____________________________ Algernon Huxley, MD jsd:sp D: 08/29/2014 14:30:20 ET T: 08/29/2014 16:10:24 ET JOB#: [id number]  cc: Algernon Huxley, MD, <Dictator> Celso Amy, MD Algernon Huxley MD ELECTRONICALLY SIGNED 08/31/2014 15:44

## 2014-11-17 ENCOUNTER — Encounter: Payer: Medicare Other | Attending: Surgery | Admitting: Surgery

## 2014-11-17 DIAGNOSIS — Z992 Dependence on renal dialysis: Secondary | ICD-10-CM | POA: Diagnosis not present

## 2014-11-17 DIAGNOSIS — L97519 Non-pressure chronic ulcer of other part of right foot with unspecified severity: Secondary | ICD-10-CM | POA: Insufficient documentation

## 2014-11-17 DIAGNOSIS — J45909 Unspecified asthma, uncomplicated: Secondary | ICD-10-CM | POA: Insufficient documentation

## 2014-11-17 DIAGNOSIS — I70235 Atherosclerosis of native arteries of right leg with ulceration of other part of foot: Secondary | ICD-10-CM | POA: Insufficient documentation

## 2014-11-17 DIAGNOSIS — E11621 Type 2 diabetes mellitus with foot ulcer: Secondary | ICD-10-CM | POA: Insufficient documentation

## 2014-11-17 DIAGNOSIS — Z89512 Acquired absence of left leg below knee: Secondary | ICD-10-CM | POA: Diagnosis not present

## 2014-11-17 DIAGNOSIS — I120 Hypertensive chronic kidney disease with stage 5 chronic kidney disease or end stage renal disease: Secondary | ICD-10-CM | POA: Insufficient documentation

## 2014-11-17 DIAGNOSIS — F17219 Nicotine dependence, cigarettes, with unspecified nicotine-induced disorders: Secondary | ICD-10-CM | POA: Insufficient documentation

## 2014-11-17 DIAGNOSIS — D649 Anemia, unspecified: Secondary | ICD-10-CM | POA: Insufficient documentation

## 2014-11-18 NOTE — Progress Notes (Signed)
UTAH, WELDEN ([id number]) Visit Report for 11/17/2014 Allergy List Details Patient Name: DAMONEY, WOZNY 11/17/2014 1:00 Date of Service: PM Medical Record [id number] Number: Patient Account Number: [id number] Date of Birth/Sex: 24-Aug-1955 (59 y.o. Male) Treating RN: Montey Hora Primary Care Other Clinician: Tomasa Hose Physician: Treating Britto, Minerva Areola, Physician/Extender: Referring Physician: Peri Maris in Treatment: 0 Allergies Active Allergies No Known Allergies Allergy Notes Electronic Signature(s) Signed: 11/17/2014 5:48:32 PM By: Montey Hora Entered By: Montey Hora on 11/17/2014 13:32:05 Couper, [name] ([id number]) -------------------------------------------------------------------------------- Arrival Information Details Patient Name: PLACIDO, ELIADES 11/17/2014 1:00 Date of Service: PM Medical Record [id number] Number: Patient Account Number: [id number] Date of Birth/Sex: 1956/03/16 (59 y.o. Male) Treating RN: Montey Hora Primary Care Other Clinician: Tomasa Hose Physician: Treating Britto, Minerva Areola, Physician/Extender: Referring Physician: Peri Maris in Treatment: 0 Visit Information Patient Arrived: Wheel Chair Arrival Time: 13:23 Accompanied By: self Transfer Assistance: None Patient Identification Verified: Yes Secondary Verification Process Yes Completed: Patient Has Alerts: Yes Patient Alerts: Patient on Blood Thinner asa 325 DMII Electronic Signature(s) Signed: 11/17/2014 5:48:32 PM By: Montey Hora Entered By: Montey Hora on 11/17/2014 13:25:17 [place], [name] ([id number]) -------------------------------------------------------------------------------- Clinic Level of Care Assessment Details Patient Name: VICTORIOUS, KIMERY 11/17/2014 1:00 Date of Service: PM Medical Record [id number] Number: Patient Account Number: [id number] Date of Birth/Sex: 1956/01/18 (59 y.o. Male) Treating  RN: Montey Hora Primary Care Other Clinician: Tomasa Hose Physician: Treating Britto, Minerva Areola, Physician/Extender: Referring Physician: Peri Maris in Treatment: 0 Clinic Level of Care Assessment Items TOOL 2 Quantity Score []  - Use when only an EandM is performed on the INITIAL visit 0 ASSESSMENTS - Nursing Assessment / Reassessment X - General Physical Exam (combine w/ comprehensive assessment (listed just 1 20 below) when performed on new pt. evals) X - Comprehensive Assessment (HX, ROS, Risk Assessments, Wounds Hx, etc.) 1 25 ASSESSMENTS - Wound and Skin Assessment / Reassessment []  - Simple Wound Assessment / Reassessment - one wound 0 X - Complex Wound Assessment / Reassessment - multiple wounds 5 5 []  - Dermatologic / Skin Assessment (not related to wound area) 0 ASSESSMENTS - Ostomy and/or Continence Assessment and Care []  - Incontinence Assessment and Management 0 []  - Ostomy Care Assessment and Management (repouching, etc.) 0 PROCESS - Coordination of Care X - Simple Patient / Family Education for ongoing care 1 15 []  - Complex (extensive) Patient / Family Education for ongoing care 0 X - Staff obtains Programmer, systems, Records, Test Results / Process Orders 1 10 []  - Staff telephones HHA, Nursing Homes / Clarify orders / etc 0 []  - Routine Transfer to another Facility (non-emergent condition) 0 []  - Routine Hospital Admission (non-emergent condition) 0 X - New Admissions / Biomedical engineer / Ordering NPWT, Apligraf, etc. 1 15 Hafford, Ulysses N. ([id number]) []  - Emergency Hospital Admission (emergent condition) 0 X - Simple Discharge Coordination 1 10 []  - Complex (extensive) Discharge Coordination 0 PROCESS - Special Needs []  - Pediatric / Minor Patient Management 0 []  - Isolation Patient Management 0 []  - Hearing / Language / Visual special needs 0 []  - Assessment of Community assistance (transportation, D/C planning, etc.) 0 []  - Additional  assistance / Altered mentation 0 []  - Support Surface(s) Assessment (bed, cushion, seat, etc.) 0 INTERVENTIONS - Wound Cleansing / Measurement X - Wound Imaging (photographs - any number of wounds) 1 5 []  - Wound Tracing (instead of photographs) 0 []  - Simple Wound Measurement - one wound 0 X - Complex Wound Measurement - multiple wounds  5 5 []  - Simple Wound Cleansing - one wound 0 X - Complex Wound Cleansing - multiple wounds 5 5 INTERVENTIONS - Wound Dressings X - Small Wound Dressing one or multiple wounds 5 10 []  - Medium Wound Dressing one or multiple wounds 0 []  - Large Wound Dressing one or multiple wounds 0 []  - Application of Medications - injection 0 INTERVENTIONS - Miscellaneous []  - External ear exam 0 []  - Specimen Collection (cultures, biopsies, blood, body fluids, etc.) 0 []  - Specimen(s) / Culture(s) sent or taken to Lab for analysis 0 []  - Patient Transfer (multiple staff / Civil Service fast streamer / Similar devices) 0 Koors, Wilgus N. ([id number]) []  - Simple Staple / Suture removal (25 or less) 0 []  - Complex Staple / Suture removal (26 or more) 0 []  - Hypo / Hyperglycemic Management (close monitor of Blood Glucose) 0 []  - Ankle / Brachial Index (ABI) - do not check if billed separately 0 Has the patient been seen at the hospital within the last three years: Yes Total Score: 225 Level Of Care: New/Established - Level 5 Electronic Signature(s) Signed: 11/17/2014 5:48:32 PM By: Montey Hora Entered By: Montey Hora on 11/17/2014 14:21:06 Moseman, [name] ([id number]) -------------------------------------------------------------------------------- Encounter Discharge Information Details Patient Name: [name]. 11/17/2014 1:00 Date of Service: PM Medical Record [id number] Number: Patient Account Number: [id number] Date of Birth/Sex: 1955-12-22 (59 y.o. Male) Treating RN: Montey Hora Primary Care Other Clinician: Tomasa Hose Physician: Treating Britto,  Minerva Areola, Physician/Extender: Referring Physician: Peri Maris in Treatment: 0 Encounter Discharge Information Items Discharge Pain Level: 0 Discharge Condition: Stable Ambulatory Status: Wheelchair Discharge Destination: Home Transportation: Private Auto Accompanied By: self Schedule Follow-up Appointment: Yes Medication Reconciliation completed and provided to Patient/Care No Mizael Sagar: Provided on Clinical Summary of Care: 11/17/2014 Form Type Recipient Paper Patient RM Electronic Signature(s) Signed: 11/17/2014 2:34:24 PM By: Ruthine Dose Entered By: Ruthine Dose on 11/17/2014 14:34:24 Lazar, [name] ([id number]) -------------------------------------------------------------------------------- Lower Extremity Assessment Details Patient Name: [name]. 11/17/2014 1:00 Date of Service: PM Medical Record [id number] Number: Patient Account Number: [id number] Date of Birth/Sex: 03-30-1956 (59 y.o. Male) Treating RN: Montey Hora Primary Care Other Clinician: Tomasa Hose Physician: Treating Britto, Minerva Areola, Physician/Extender: Referring Physician: Peri Maris in Treatment: 0 Edema Assessment Assessed: [Left: No] [Right: No] E[Left: dema] [Right: :] Calf Left: Right: Point of Measurement: 36 cm From Medial Instep cm 32.6 cm Ankle Left: Right: Point of Measurement: 11 cm From Medial Instep cm 22.7 cm Vascular Assessment Pulses: Posterior Tibial Palpable: [Right:Yes] Doppler: [Right:Monophasic] Dorsalis Pedis Palpable: [Right:Yes] Doppler: [Right:Monophasic] Extremity colors, hair growth, and conditions: Extremity Color: [Right:Normal] Hair Growth on Extremity: [Right:Yes] Temperature of Extremity: [Right:Warm] Capillary Refill: [Right:< 3 seconds] Toe Nail Assessment Left: Right: Thick: Yes Discolored: No Deformed: No Improper Length and Hygiene: No Werts, KODAH WINGARD ([id number]) Electronic Signature(s) Signed:  11/17/2014 5:48:32 PM By: Montey Hora Entered By: Montey Hora on 11/17/2014 14:05:07 Aziz, [name] ([id number]) -------------------------------------------------------------------------------- Multi Wound Chart Details Patient Name: [name]. 11/17/2014 1:00 Date of Service: PM Medical Record [id number] Number: Patient Account Number: [id number] Date of Birth/Sex: 1955/06/30 (59 y.o. 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] Date Acquired: [1:09/14/2014] [2:09/14/2014] [3:09/14/2014] Weeks of Treatment: [1:0] [2:0] [3:0] Wound Status: [1:Open] [2:Open] [3:Open] Measurements L x W x D 1.7x0.6x0.1 [2:3.5x1.5x0.1] [3:3x1.5x0.1] (cm) Area (cm) : [1:0.801] [2:4.123] [3:3.534] Volume (cm) : [1:0.08] [2:0.412] [3:0.353] Periwound Skin Texture: No Abnormalities Noted No Abnormalities Noted No Abnormalities Noted Periwound Skin [1:No Abnormalities Noted No Abnormalities Noted No Abnormalities Noted] Moisture: Periwound Skin Color: No Abnormalities Noted No Abnormalities Noted No Abnormalities Noted Tenderness on [1:No] [2:No] [3:No] Wound Number: 4 5 N/A Photos: No Photos No Photos N/A Wound Location: Right, Dorsal Toe Fourth Right, Dorsal Metatarsal N/A head fifth Wounding Event: Gradually  Appeared Gradually Appeared N/A 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 N/A Date Acquired: 09/14/2014 09/14/2014 N/A Weeks of Treatment: 0 0 N/A Esbenshade, Salman Delane Ginger ([id number]) Wound Status: Open Open N/A Measurements L x W x D 1.5x0.6x0.1 2x1.6x0.1 N/A (cm) Area (cm) : 0.707 2.513 N/A Volume (cm) : 0.071 0.251 N/A Periwound Skin Texture: No Abnormalities Noted No Abnormalities Noted N/A Periwound Skin No Abnormalities Noted No Abnormalities Noted N/A Moisture: Periwound Skin Color: No Abnormalities Noted No Abnormalities Noted N/A Tenderness on No No N/A Palpation: Treatment Notes Electronic Signature(s) Signed: 11/17/2014 5:48:32 PM By: Montey Hora Entered By: Montey Hora on 11/17/2014 14:17:37 Liwanag, [name] ([id number]) -------------------------------------------------------------------------------- Multi-Disciplinary Care Plan Details Patient Name: [name]. 11/17/2014 1:00 Date of Service: PM Medical Record [id number] Number: Patient Account Number: [id number] Date of Birth/Sex: 1955-11-30 (59 y.o. Male) Treating RN: Montey Hora Primary Care Other Clinician: Tomasa Hose Physician: Treating Britto, Minerva Areola, Physician/Extender: Referring Physician: Peri Maris in Treatment: 0 Active Inactive Abuse / Safety / Falls / Self Care Management Nursing Diagnoses: Potential for falls Goals: Patient will remain injury free Date Initiated: 11/17/2014 Goal Status: Active Interventions: Assess fall risk on admission and as needed Notes: Orientation to the Wound Care Program Nursing Diagnoses: Knowledge deficit related to the wound healing center program Goals: Patient/caregiver will verbalize understanding of the [facility] Program Date Initiated: 11/17/2014 Goal Status: Active Interventions: Provide education on orientation to the wound center Notes: Wound/Skin Impairment Nursing  Diagnoses: Knowledge deficit related to smoking impact on wound healing Maule, Donzell N. ([id number]) Goals: Patient will demonstrate a reduced rate of smoking or cessation of smoking Date Initiated: 11/17/2014 Goal Status: Active Ulcer/skin breakdown will have a volume reduction of 30% by week 4 Date Initiated: 11/17/2014 Goal Status: Active Interventions: Assess ulceration(s) every visit Provide education on smoking Notes: Electronic Signature(s) Signed: 11/17/2014 5:48:32 PM By: Montey Hora Entered By: Montey Hora on 11/17/2014 14:15:05 Nunn, [name] ([id number]) -------------------------------------------------------------------------------- Pain Assessment Details Patient Name: [name]. 11/17/2014 1:00 Date of Service: PM Medical Record [id number] Number: Patient Account Number: [id number] Date of Birth/Sex: [birth year] (59 y.o. Male) Treating RN: Montey Hora Primary Care Other Clinician: Tomasa Hose Physician: Treating Britto, Minerva Areola, Physician/Extender: Referring Physician: Peri Maris in Treatment: 0 Active Problems Location of Pain Severity and Description of Pain Patient Has Paino Yes Site Locations Pain Location: Pain in Ulcers With Dressing Change: Yes Duration of the Pain. Constant / Intermittento Constant Pain Management and Medication Current Pain Management: Electronic Signature(s) Signed: 11/17/2014 5:48:32 PM By: Montey Hora Entered By: Montey Hora on 11/17/2014 13:25:32 Worster, [name] ([id number]) -------------------------------------------------------------------------------- Patient/Caregiver Education Details Patient Name: BRYCEN, SALSEDO 11/17/2014 1:00 Date of Service: PM Medical Record [id number] Number: Patient Account Number: [id number] Date of Birth/Gender: 1956-04-26 (  59 y.o. Male) Treating RN: Montey Hora Primary Care Other Clinician: Tomasa Hose Physician: Treating Britto,  Minerva Areola, Physician/Extender: Referring Physician: Peri Maris in Treatment: 0 Education Assessment Education Provided To: Patient Education Topics Provided Wound/Skin Impairment: Handouts: Other: stop smoking and wound care as ordered Methods: Demonstration, Explain/Verbal Responses: State content correctly Electronic Signature(s) Signed: 11/17/2014 5:48:32 PM By: Montey Hora Entered By: Montey Hora on 11/17/2014 14:21:47 Vora, [name] ([id number]) -------------------------------------------------------------------------------- Wound Assessment Details Patient Name: Rella Larve N. 11/17/2014 1:00 Date of Service: PM Medical Record [id number] Number: Patient Account Number: [id number] Date of Birth/Sex: Jun 15, 1955 (59 y.o. Male) Treating RN: Montey Hora Primary Care Other Clinician: Tomasa Hose Physician: Treating Britto, Minerva Areola, Physician/Extender: Referring Physician: Peri Maris in Treatment: 0 Wound Status Wound Number: 1 Primary Etiology: Arterial Insufficiency Ulcer Wound Location: Right, Dorsal Metatarsal head Wound Status: Open first Wounding Event: Gradually Appeared Date Acquired: 09/14/2014 Weeks Of Treatment: 0 Clustered Wound: No Photos Photo Uploaded By: Montey Hora on 11/17/2014 17:44:36 Wound Measurements Length: (cm) 1.7 % Reduction i Width: (cm) 0.6 % Reduction i Depth: (cm) 0.1 Area: (cm) 0.801 Volume: (cm) 0.08 n Area: n Volume: Periwound Skin Texture Texture Color No Abnormalities Noted: No No Abnormalities Noted: No Moisture No Abnormalities Noted: No Treatment Notes Wound #1 (Right, Dorsal Metatarsal head first) Boden, Guthrie N. ([id number]) 1. Cleansed with: Clean wound with Normal Saline 2. Anesthetic Topical Lidocaine 4% cream to wound bed prior to debridement 4. Dressing Applied: Aquacel Ag 5. Secondary Dressing Applied Kerlix/Conform 7. Secured with Architect) Signed: 11/17/2014 5:48:32 PM By: Montey Hora Entered By: Montey Hora on 11/17/2014 13:53:39 Cory, [name] ([id number]) -------------------------------------------------------------------------------- Wound Assessment Details Patient Name: MALAKII, HARL 11/17/2014 1:00 Date of Service: PM Medical Record [id number] Number: Patient Account Number: [id number] Date of Birth/Sex: 03-May-1956 (59 y.o. Male) Treating RN: Montey Hora Primary Care Other Clinician: Tomasa Hose Physician: Treating Britto, Minerva Areola, Physician/Extender: Referring Physician: Peri Maris in Treatment: 0 Wound Status Wound Number: 2 Primary Etiology: Arterial Insufficiency Ulcer Wound Location: Right, Dorsal Toe Second Wound Status: Open Wounding Event: Gradually Appeared Date Acquired: 09/14/2014 Weeks Of Treatment: 0 Clustered Wound: No Photos Photo Uploaded By: Montey Hora on 11/17/2014 17:44:37 Wound Measurements Length: (cm) 3.5 % Reduction in Width: (cm) 1.5 % Reduction in Depth: (cm) 0.1 Area: (cm) 4.123 Volume: (cm) 0.412 Area: Volume: Periwound Skin Texture Texture Color No Abnormalities Noted: No No Abnormalities Noted: No Moisture No Abnormalities Noted: No Treatment Notes Wound #2 (Right, Dorsal Toe Second) 1. Cleansed with: Wint, Delmas N. ([id number]) Clean wound with Normal Saline 2. Anesthetic Topical Lidocaine 4% cream to wound bed prior to debridement 4. Dressing Applied: Aquacel Ag 5. Secondary Dressing Applied Kerlix/Conform 7. Secured with Recruitment consultant) Signed: 11/17/2014 5:48:32 PM By: Montey Hora Entered By: Montey Hora on 11/17/2014 13:56:56 Showman, [name] ([id number]) -------------------------------------------------------------------------------- Wound Assessment Details Patient Name: YOHAN, NEIL 11/17/2014 1:00 Date of Service: PM Medical Record [id number] Number: Patient Account  Number: [id number] Date of Birth/Sex: Sep 25, 1955 (59 y.o. Male) Treating RN: Montey Hora Primary Care Other Clinician: Tomasa Hose Physician: Treating Britto, Minerva Areola, Physician/Extender: Referring Physician: Peri Maris in Treatment: 0 Wound Status Wound Number: 3 Primary Etiology: Arterial Insufficiency Ulcer Wound Location: Right, Dorsal Toe Third Wound Status: Open Wounding Event: Gradually Appeared Date Acquired: 09/14/2014 Weeks Of Treatment: 0 Clustered Wound: No Photos Photo Uploaded By: Montey Hora on 11/17/2014 17:45:08 Wound Measurements Length: (cm) 3 % Reduction in Width: (cm) 1.5 % Reduction in Depth: (cm) 0.1  Area: (cm) 3.534 Volume: (cm) 0.353 Area: Volume: Periwound Skin Texture Texture Color No Abnormalities Noted: No No Abnormalities Noted: No Moisture No Abnormalities Noted: No Treatment Notes Wound #3 (Right, Dorsal Toe Third) 1. Cleansed with: Rider, Norris N. ([id number]) Clean wound with Normal Saline 2. Anesthetic Topical Lidocaine 4% cream to wound bed prior to debridement 4. Dressing Applied: Aquacel Ag 5. Secondary Dressing Applied Kerlix/Conform 7. Secured with Recruitment consultant) Signed: 11/17/2014 5:48:32 PM By: Montey Hora Entered By: Montey Hora on 11/17/2014 13:58:45 Whitlatch, [name] ([id number]) -------------------------------------------------------------------------------- Wound Assessment Details Patient Name: OLUWAJOMILOJU, DELLAPORTA 11/17/2014 1:00 Date of Service: PM Medical Record [id number] Number: Patient Account Number: [id number] Date of Birth/Sex: Aug 17, 1955 (59 y.o. Male) Treating RN: Montey Hora Primary Care Other Clinician: Tomasa Hose Physician: Treating Britto, Minerva Areola, Physician/Extender: Referring Physician: Peri Maris in Treatment: 0 Wound Status Wound Number: 4 Primary Etiology: Arterial Insufficiency Ulcer Wound Location: Right, Dorsal Toe  Fourth Wound Status: Open Wounding Event: Gradually Appeared Date Acquired: 09/14/2014 Weeks Of Treatment: 0 Clustered Wound: No Photos Photo Uploaded By: Montey Hora on 11/17/2014 17:45:09 Wound Measurements Length: (cm) 1.5 % Reduction i Width: (cm) 0.6 % Reduction i Depth: (cm) 0.1 Area: (cm) 0.707 Volume: (cm) 0.071 n Area: n Volume: Periwound Skin Texture Texture Color No Abnormalities Noted: No No Abnormalities Noted: No Moisture No Abnormalities Noted: No Treatment Notes Wound #4 (Right, Dorsal Toe Fourth) 1. Cleansed with: Corkum, Rondey N. ([id number]) Clean wound with Normal Saline 2. Anesthetic Topical Lidocaine 4% cream to wound bed prior to debridement 4. Dressing Applied: Aquacel Ag 5. Secondary Dressing Applied Kerlix/Conform 7. Secured with Recruitment consultant) Signed: 11/17/2014 5:48:32 PM By: Montey Hora Entered By: Montey Hora on 11/17/2014 14:00:24 Mcgeehan, [name] ([id number]) -------------------------------------------------------------------------------- Wound Assessment Details Patient Name: RONEL, HABASH 11/17/2014 1:00 Date of Service: PM Medical Record [id number] Number: Patient Account Number: [id number] Date of Birth/Sex: [birth year] (59 y.o. Male) Treating RN: Montey Hora Primary Care Other Clinician: Tomasa Hose Physician: Treating Britto, Minerva Areola, Physician/Extender: Referring Physician: Peri Maris in Treatment: 0 Wound Status Wound Number: 5 Primary Etiology: Arterial Insufficiency Ulcer Wound Location: Right, Dorsal Metatarsal head Wound Status: Open fifth Wounding Event: Gradually Appeared Date Acquired: 09/14/2014 Weeks Of Treatment: 0 Clustered Wound: No Photos Photo Uploaded By: Montey Hora on 11/17/2014 17:45:25 Wound Measurements Length: (cm) 2 % Reduction i Width: (cm) 1.6 % Reduction i Depth: (cm) 0.1 Area: (cm) 2.513 Volume: (cm) 0.251 n Area: n  Volume: Periwound Skin Texture Texture Color No Abnormalities Noted: No No Abnormalities Noted: No Moisture No Abnormalities Noted: No Treatment Notes Wound #5 (Right, Dorsal Metatarsal head fifth) Leavy, Tayron N. ([id number]) 1. Cleansed with: Clean wound with Normal Saline 2. Anesthetic Topical Lidocaine 4% cream to wound bed prior to debridement 4. Dressing Applied: Aquacel Ag 5. Secondary Dressing Applied Kerlix/Conform 7. Secured with Recruitment consultant) Signed: 11/17/2014 5:48:32 PM By: Montey Hora Entered By: Montey Hora on 11/17/2014 14:02:03 Huhn, [name] ([id number]) -------------------------------------------------------------------------------- [place] Details Patient Name: KIRKLYN, CUOCO 11/17/2014 1:00 Date of Service: PM Medical Record [id number] Number: Patient Account Number: [id number] Date of Birth/Sex: August 28, 1955 (59 y.o. Male) Treating RN: Montey Hora Primary Care Other Clinician: Tomasa Hose Physician: Treating Britto, Minerva Areola, Physician/Extender: Referring Physician: Peri Maris in Treatment: 0 Vital Signs Time Taken: 13:25 Temperature (F): 98.3 Height (in): 73 Pulse (bpm): 69 Source: Stated Respiratory Rate (breaths/min): 18 Weight (lbs): 183 Blood Pressure (mmHg): 150/87 Source: Stated Reference Range: 80 - 120 mg / dl Body Mass Index (  BMI): 24.1 Electronic Signature(s) Signed: 11/17/2014 5:48:32 PM By: Montey Hora Entered By: Montey Hora on 11/17/2014 13:29:15

## 2014-11-18 NOTE — Progress Notes (Signed)
ALBERTUS, MERRIAM ([id number]) Visit Report for 11/17/2014 Abuse/Suicide Risk Screen Details Patient Name: [name], [name] 11/17/2014 1:00 Date of Service: PM Medical Record [id number] Number: Patient Account Number: [id number] Date of Birth/Sex: Jul 10, 1955 (59 y.o. Male) Treating RN: Montey Hora Primary Care Other Clinician: Tomasa Hose Physician: Treating Britto, Minerva Areola, Physician/Extender: Referring Physician: Peri Maris in Treatment: 0 Abuse/Suicide Risk Screen Items Answer ABUSE/SUICIDE RISK SCREEN: Has anyone close to you tried to hurt or harm you recentlyo No Do you feel uncomfortable with anyone in your familyo No Has anyone forced you do things that you didnot want to doo No Do you have any thoughts of harming yourselfo No Patient displays signs or symptoms of abuse and/or neglect. No Electronic Signature(s) Signed: 11/17/2014 5:48:32 PM By: Montey Hora Entered By: Montey Hora on 11/17/2014 13:49:34 Fitchett, [name] ([id number]) -------------------------------------------------------------------------------- Activities of Daily Living Details Patient Name: [name], [name] 11/17/2014 1:00 Date of Service: PM Medical Record [id number] Number: Patient Account Number: [id number] Date of Birth/Sex: [birth year] (59 y.o. Male) Treating RN: Montey Hora Primary Care Other Clinician: Tomasa Hose Physician: Treating Britto, Minerva Areola, Physician/Extender: Referring Physician: Peri Maris in Treatment: 0 Activities of Daily Living Items Answer Activities of Daily Living (Please select one for each item) Drive Automobile Not Able Take Medications Need Assistance Use Telephone Completely Able Care for Appearance Need Assistance Use Toilet Completely Able Bath / Shower Need Assistance Dress Self Completely Able Feed Self Completely Able Walk Need Assistance Get In / Out Bed Completely [place] for Self Need Assistance Electronic Signature(s) Signed: 11/17/2014 5:48:32 PM By: Montey Hora Entered By: Montey Hora on 11/17/2014 13:50:09 Frieling, [name] ([id number]) -------------------------------------------------------------------------------- Education Assessment Details Patient Name: Joel Monaco. 11/17/2014 1:00 Date of Service: PM Medical Record [id number] Number: Patient Account Number: [id number] Date of Birth/Sex: [birth year] (59 y.o. Male) Treating RN: Montey Hora Primary Care Other Clinician: Tomasa Hose Physician: Treating Britto, Minerva Areola, Physician/Extender: Referring Physician: Peri Maris in Treatment: 0 Primary Learner Assessed: Patient Learning Preferences/Education Level/Primary Language Learning Preference: Explanation, Demonstration Highest Education Level: High School Preferred Language: English Cognitive Barrier Assessment/Beliefs Language Barrier: No Translator Needed: No Memory Deficit: No Emotional Barrier: No Cultural/Religious Beliefs Affecting Medical No Care: Physical Barrier Assessment Impaired Vision: No Impaired Hearing: No Decreased Hand dexterity: No Knowledge/Comprehension Assessment Knowledge Level: Medium Comprehension Level: Medium Ability to understand written Medium instructions: Ability to understand verbal Medium instructions: Motivation Assessment Anxiety Level: Anxious Cooperation: Cooperative Education Importance: Acknowledges Need Interest in Health Problems: Asks Questions Perception: Coherent Willingness to Engage in Self- Medium Management Activities: Medium Hynson, ZACKARIE MASSARI ([id number]) Readiness to Engage in Self- Management Activities: Electronic Signature(s) Signed: 11/17/2014 5:48:32 PM By: Montey Hora Entered By: Montey Hora on 11/17/2014 13:50:29 Rubert, [name]  ([id number]) -------------------------------------------------------------------------------- Fall Risk Assessment Details Patient Name: Joel Monaco. 11/17/2014 1:00 Date of Service: PM Medical Record [id number] Number: Patient Account Number: [id number] Date of Birth/Sex: 1956/01/20 (59 y.o. Male) Treating RN: Montey Hora Primary Care Other Clinician: Tomasa Hose Physician: Treating Britto, Minerva Areola, Physician/Extender: Referring Physician: Peri Maris in Treatment: 0 Fall Risk Assessment Items FALL RISK ASSESSMENT: History of falling - immediate or within 3 months 0 No Secondary diagnosis 0 No Ambulatory aid None/bed rest/wheelchair/nurse 0 Yes Crutches/cane/walker 0 No Furniture 0 No IV Access/Saline Lock 0 No Gait/Training Normal/bed rest/immobile 0 Yes Weak 0 No Impaired 0 No Mental Status Oriented to own ability 0 Yes Electronic Signature(s) Signed: 11/17/2014 5:48:32  PM By: Montey Hora Entered By: Montey Hora on 11/17/2014 13:50:37 [place], [name] ([id number]) -------------------------------------------------------------------------------- Foot Assessment Details Patient Name: [name], [name] 11/17/2014 1:00 Date of Service: PM Medical Record [id number] Number: Patient Account Number: [id number] Date of Birth/Sex: 08-Sep-1955 (59 y.o. Male) Treating RN: Montey Hora Primary Care Other Clinician: Tomasa Hose Physician: Treating Britto, Minerva Areola, Physician/Extender: Referring Physician: Peri Maris in Treatment: 0 Foot Assessment Items Site Locations + = Sensation present, - = Sensation absent, C = Callus, U = Ulcer R = Redness, W = Warmth, M = Maceration, PU = Pre-ulcerative lesion F = Fissure, S = Swelling, D = Dryness Assessment Right: Left: Other Deformity: No No Prior Foot Ulcer: No No Prior Amputation: No No Charcot Joint: No No Ambulatory Status: Non-ambulatory Assistance Device: Wheelchair Gait:  Architect) Signed: 11/17/2014 5:48:32 PM By: Montey Hora [name], [name] ([id number]) Entered By: Montey Hora on 11/17/2014 13:51:08 [name], [name] ([id number]) -------------------------------------------------------------------------------- Nutrition Risk Assessment Details Patient Name: Joel Larve N. 11/17/2014 1:00 Date of Service: PM Medical Record [id number] Number: Patient Account Number: [id number] Date of Birth/Sex: 09/08/1955 (59 y.o. Male) Treating RN: Montey Hora Primary Care Other Clinician: Tomasa Hose Physician: Treating Britto, Minerva Areola, Physician/Extender: Referring Physician: Peri Maris in Treatment: 0 Height (in): 73 Weight (lbs): 183 Body Mass Index (BMI): 24.1 Nutrition Risk Assessment Items NUTRITION RISK SCREEN: I have an illness or condition that made me change the kind and/or 0 No amount of food I eat I eat fewer than two meals per day 0 No I eat few fruits and vegetables, or milk products 0 No I have three or more drinks of beer, liquor or wine almost every day 0 No I have tooth or mouth problems that make it hard for me to eat 0 No I don't always have enough money to buy the food I need 0 No I eat alone most of the time 0 No I take three or more different prescribed or over-the-counter drugs a 1 Yes day Without wanting to, I have lost or gained 10 pounds in the last six 0 No months I am not always physically able to shop, cook and/or feed myself 0 No Nutrition Protocols Good Risk Protocol 0 No interventions needed Moderate Risk Protocol Electronic Signature(s) Signed: 11/17/2014 5:48:32 PM By: Montey Hora Entered By: Montey Hora on 11/17/2014 13:50:44

## 2014-11-18 NOTE — Progress Notes (Addendum)
Joel, [name] ([id number]) Visit Report for 11/17/2014 Chief Complaint Document Details Patient Name: [name], [name] 11/17/2014 1:00 Date of Service: PM Medical Record [id number] Number: Patient Account Number: [id number] Date of Birth/Sex: April 03, 1956 (58 y.o. Male) Treating RN: Primary Care Other Clinician: Tomasa Hose Physician: Treating Tisheena Maguire, Minerva Areola, Physician/Extender: Referring Physician: Peri Maris in Treatment: 0 Information Obtained from: Patient Chief Complaint Patient presents to the wound care center today with an open arterial ulcer, on his right foot and also severe [name] for several weeks. Electronic Signature(s) Signed: 11/17/2014 5:00:54 PM By: Christin Fudge MD, FACS Entered By: Christin Fudge on 11/17/2014 14:39:17 [name], [name] ([id number]) -------------------------------------------------------------------------------- HPI Details Patient Name: [name], [name] 11/17/2014 1:00 Date of Service: PM Medical Record [id number] Number: Patient Account Number: [id number] Date of Birth/Sex: [birth year] (58 y.o. Male) Treating RN: Primary Care Other Clinician: Tomasa Hose Physician: Treating Montserrath Madding, Minerva Areola, Physician/Extender: Referring Physician: Peri Maris in Treatment: 0 History of Present Illness Location: right foot [name] with ulcers on his forefoot Quality: Patient reports experiencing burning to affected area(s). Severity: Patient states wound are getting worse. Duration: Patient has had the wound for > 3 months prior to seeking treatment at the wound center Timing: [name] in wound is constant (hurts all the time) Context: The wound appeared gradually over time Modifying Factors: Consults to this date include: vascular consult for angioplasty Associated Signs and Symptoms: Patient reports having: difficulty in using his prosthesis and is wheelchair-bound. HPI Description: Seen by Dr. Leotis [name] on 08/29/2014 with  ulceration on 2 toes on the right foot and dark discoloration worrisome for gangrenous changes on his 2nd and 3rd toes as well.o Given his previous history of below-knee amputation for peripheral disease and ulceration on the left, his multiple atherosclerotic risk factors and his poorly palpable pedal pulses, he was brought in for angiography for further evaluation and potential treatment. He had a percutaneous angioplasty of the right popliteal artery and the right anterior tibial artery and dorsalis pedis artery. He also had angioplasty of the right peroneal artery and the proximal and mid right posterior tibial artery. On 11/07/2014 he's had a duplex of the right lower extremity which showed the ABI was 0.9 and had a multiphasic waveform. Impression was of mild peripheral vascular disease predominantly small vessel disease right ankle. 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 Electronic Signature(s) Signed: 11/17/2014 5:00:54 PM By: Christin Fudge MD, FACS Entered By: Christin Fudge on 11/17/2014 14:40:27 [name], [name] ([id number]) -------------------------------------------------------------------------------- Physical Exam Details Patient Name: [name], [name] 11/17/2014 1:00 Date of Service: PM Medical Record [id number] Number: Patient Account Number: [id number] Date of Birth/Sex: 1956/05/24 (58 y.o. Male) Treating RN: Primary Care Other Clinician: Tomasa Hose Physician: Treating Nickisha Hum, Minerva Areola, Physician/Extender: Referring Physician: Peri Maris in Treatment: 0 Constitutional . Pulse regular. Respirations normal and unlabored. Afebrile. . Eyes Nonicteric. Reactive to light. Ears, Nose, Mouth, and Throat Lips, teeth, and gums WNL.[name] Moist mucosa without lesions . Neck supple and nontender. No palpable supraclavicular or cervical adenopathy. Normal sized without  goiter. Respiratory WNL. No retractions.. Cardiovascular Pedal Pulses very febrile and his ABI on the right side is 0.9.. No clubbing, cyanosis or edema. Gastrointestinal (GI) Abdomen without masses or tenderness.. No liver or spleen enlargement or tenderness.. Integumentary (Hair, Skin) superficial ulceration on some of his toes and the forefoot. Severe tenderness on touch. No crepitus or fluctuance. No peri-wound warmth or erythema. No masses.[name] Psychiatric Judgement and insight  Intact.. No evidence of depression, anxiety, or agitation.. Electronic Signature(s) Signed: 11/17/2014 5:00:54 PM By: Christin Fudge MD, FACS Entered By: Christin Fudge on 11/17/2014 14:41:33 [name], [name] ([id number]) -------------------------------------------------------------------------------- Physician Orders Details Patient Name: [name], [name] 11/17/2014 1:00 Date of Service: PM Medical Record [id number] Number: Patient Account Number: [id number] Date of Birth/Sex: December 22, 1955 (58 y.o. Male) Treating RN: Montey Hora Primary Care Other Clinician: Tomasa Hose Physician: Treating Romy Ipock, Minerva Areola, Physician/Extender: Referring Physician: Peri Maris in Treatment: 0 Verbal / Phone Orders: Yes Clinician: Montey Hora Read Back and Verified: Yes Diagnosis Coding Wound Cleansing Wound #1 Right,Dorsal Metatarsal head first o Clean wound with Normal Saline. o May Shower, gently pat wound dry prior to applying new dressing. - shower prior to wound care, if no shower prior to wound care then do not shower and cleanse wounds with NS prior to wound care Wound #2 Right,Dorsal Toe Second o Clean wound with Normal Saline. o May Shower, gently pat wound dry prior to applying new dressing. - shower prior to wound care, if no shower prior to wound care then do not shower and cleanse wounds with NS prior to wound care Wound #3 Right,Dorsal Toe Third o Clean wound with Normal  Saline. o May Shower, gently pat wound dry prior to applying new dressing. - shower prior to wound care, if no shower prior to wound care then do not shower and cleanse wounds with NS prior to wound care Wound #4 Right,Dorsal Toe Fourth o Clean wound with Normal Saline. o May Shower, gently pat wound dry prior to applying new dressing. - shower prior to wound care, if no shower prior to wound care then do not shower and cleanse wounds with NS prior to wound care Wound #5 Right,Dorsal Metatarsal head fifth o Clean wound with Normal Saline. o May Shower, gently pat wound dry prior to applying new dressing. - shower prior to wound care, if no shower prior to wound care then do not shower and cleanse wounds with NS prior to wound care Anesthetic Wound #1 Right,Dorsal Metatarsal head first o Topical Lidocaine 4% cream applied to wound bed prior to debridement Wound #2 Right,Dorsal Toe Second o Topical Lidocaine 4% cream applied to wound bed prior to debridement Wound #3 Right,Dorsal Toe Third o Topical Lidocaine 4% cream applied to wound bed prior to debridement [name], [name] ([id number]) Wound #4 Right,Dorsal Toe Fourth o Topical Lidocaine 4% cream applied to wound bed prior to debridement Wound #5 Right,Dorsal Metatarsal head fifth o Topical Lidocaine 4% cream applied to wound bed prior to debridement Primary Wound Dressing Wound #1 Right,Dorsal Metatarsal head first o Aquacel Ag - or calcium alginate with silver equivalent Wound #2 Right,Dorsal Toe Second o Aquacel Ag - or calcium alginate with silver equivalent Wound #3 Right,Dorsal Toe Third o Aquacel Ag - or calcium alginate with silver equivalent Wound #4 Right,Dorsal Toe Fourth o Aquacel Ag - or calcium alginate with silver equivalent Wound #5 Right,Dorsal Metatarsal head fifth o Aquacel Ag - or calcium alginate with silver equivalent Secondary Dressing Wound #1 Right,Dorsal Metatarsal head  first o Conform/Kerlix Wound #2 Right,Dorsal Toe Second o Conform/Kerlix Wound #3 Right,Dorsal Toe Third o Conform/Kerlix Wound #4 Right,Dorsal Toe Fourth o Conform/Kerlix Wound #5 Right,Dorsal Metatarsal head fifth o Conform/Kerlix Dressing Change Frequency Wound #1 Right,Dorsal Metatarsal head first o Change Dressing Monday, Wednesday, Friday Wound #2 Right,Dorsal Toe Second o Change Dressing Monday, Wednesday, Friday Wound #3 Right,Dorsal Toe Third o Change Dressing Monday, Wednesday, Friday Edberg, Janet N. (  [id number]) Wound #4 Right,Dorsal Toe Fourth o Change Dressing Monday, Wednesday, Friday Wound #5 Right,Dorsal Metatarsal head fifth o Change Dressing Monday, Wednesday, Friday Follow-up Appointments Wound #1 Right,Dorsal Metatarsal head first o Return Appointment in 1 week. Wound #2 Right,Dorsal Toe Second o Return Appointment in 1 week. Wound #3 Right,Dorsal Toe Third o Return Appointment in 1 week. Wound #4 Right,Dorsal Toe Fourth o Return Appointment in 1 week. Wound #5 Right,Dorsal Metatarsal head fifth o Return Appointment in 1 week. Additional Orders / Instructions Wound #1 Right,Dorsal Metatarsal head first o Stop Smoking - please provide patient with nicotine patches Wound #2 Right,Dorsal Toe Second o Stop Smoking - please provide patient with nicotine patches Wound #3 Right,Dorsal Toe Third o Stop Smoking - please provide patient with nicotine patches Wound #4 Right,Dorsal Toe Fourth o Stop Smoking - please provide patient with nicotine patches Wound #5 Right,Dorsal Metatarsal head fifth o Stop Smoking - please provide patient with nicotine patches Radiology o X-ray, foot - right foot oooo Cermak, GUSTAV FIEBIGER ([id number]) Electronic Signature(s) Signed: 11/17/2014 2:42:19 PM By: Montey Hora Signed: 11/17/2014 5:00:54 PM By: Christin Fudge MD, FACS Entered By: Montey Hora on 11/17/2014 14:42:18 Piatek,  [name] ([id number]) -------------------------------------------------------------------------------- Problem List Details Patient Name: Rella Larve N. 11/17/2014 1:00 Date of Service: PM Medical Record [id number] Number: Patient Account Number: [id number] Date of Birth/Sex: [birth year] (59 y.o. 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 11/17/2014 1:00 Date of Service: PM Medical Record [id number] Number: Patient Account Number: [id number] Date of Birth/Sex: Sep 04, 1955 (58 y.o. Male) Treating RN: Primary Care Other Clinician: Tomasa Hose Physician: Treating Dixie Jafri, Minerva Areola, Physician/Extender: Referring Physician: Peri Maris in Treatment: 0 Subjective Chief Complaint Information obtained from Patient Patient presents to the wound care center today with an open arterial ulcer, on his right foot and also severe [name] for several weeks. History of Present Illness (HPI) The following HPI elements were documented for the patient's  wound: Location: right foot [name] with ulcers on his forefoot Quality: Patient reports experiencing burning to affected area(s). Severity: Patient states wound are getting worse. Duration: Patient has had the wound for > 3 months prior to seeking treatment at the wound center Timing: [name] in wound is constant (hurts all the time) Context: The wound appeared gradually over time Modifying Factors: Consults to this date include: vascular consult for angioplasty Associated Signs and Symptoms: Patient reports having: difficulty in using his prosthesis and is wheelchair- bound. Seen by Dr. Leotis [name] on 08/29/2014 with ulceration on 2 toes on the right foot and dark discoloration worrisome for gangrenous changes on his 2nd and 3rd toes as well. Given his previous history of below- knee amputation for peripheral disease and ulceration on the left, his multiple atherosclerotic risk factors and his poorly palpable pedal pulses, he was brought in for angiography for further evaluation and potential treatment.  He had a percutaneous angioplasty of the right popliteal artery and the right anterior tibial artery and dorsalis pedis artery. He also had angioplasty of the right peroneal artery and the proximal and mid right posterior tibial artery. On 11/07/2014 he's had a duplex of the right lower extremity which showed the ABI was 0.9 and had a multiphasic waveform. Impression was of mild peripheral vascular disease predominantly small vessel disease right ankle. 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 Wound History Patient presents with 6 open wounds that have been present for approximately 1-2 months. Patient has Trainer, ORIEL KLAUS. ([id number]) been treating wounds in the following manner: nursing home treasting. Laboratory tests have not been performed in the last month. Patient reportedly has not tested positive for an  antibiotic resistant organism. Patient reportedly has not tested positive for osteomyelitis. Patient reportedly has had testing performed to evaluate circulation in the legs. Patient History Information obtained from Patient. Allergies No Known Allergies Social History Current every day smoker, Marital Status - Widowed, Alcohol Use - Never, Drug Use - Prior History, Caffeine Use - Never. Medical History Eyes Denies history of Cataracts, Glaucoma, Optic Neuritis Ear/Nose/Mouth/Throat Denies history of Chronic sinus problems/congestion, Middle ear problems Hematologic/Lymphatic Patient has history of Anemia Denies history of Hemophilia, Human Immunodeficiency Virus, Lymphedema, Sickle Cell Disease Respiratory Patient has history of Asthma Denies history of Aspiration, Chronic Obstructive Pulmonary Disease (COPD), Pneumothorax, Tuberculosis Cardiovascular Patient has history of Coronary Artery Disease, Hypertension Denies history of Angina, Arrhythmia, Congestive Heart Failure, Deep Vein Thrombosis, Hypotension, Myocardial Infarction, Peripheral Arterial Disease, Peripheral Venous Disease, Phlebitis, Vasculitis Gastrointestinal Denies history of Cirrhosis , Colitis, Crohn s, Hepatitis A, Hepatitis B, Hepatitis C Endocrine Patient has history of Type II Diabetes Denies history of Type I Diabetes Genitourinary Patient has history of End Stage Renal Disease - HD MWF Immunological Denies history of Lupus Erythematosus, Raynaud s, Scleroderma Integumentary (Skin) Denies history of History of Burn, History of pressure wounds Musculoskeletal Denies history of Gout, Rheumatoid Arthritis, Osteoarthritis, Osteomyelitis Neurologic Patient has history of Neuropathy Denies history of Dementia, Quadriplegia, Paraplegia, Seizure Disorder Oncologic Denies history of Received Chemotherapy, Received Radiation Psychiatric Denies history of Anorexia/bulimia, Confinement Anxiety [name], Joel  N. ([id number]) Patient is treated with Controlled Diet. Blood sugar is tested. Hospitalization/Surgery History - 06/27/2014, [place], L BKA. Medical And Surgical History Notes Musculoskeletal L BKA Review of Systems (ROS) Constitutional Symptoms (General Health) The patient has no complaints or symptoms. Eyes The patient has no complaints or symptoms. Ear/Nose/Mouth/Throat The patient has no complaints or symptoms. Hematologic/Lymphatic The patient has no complaints or symptoms. Respiratory Denies complaints or symptoms of Chronic or frequent coughs, Shortness of Breath. Cardiovascular The patient has no complaints or symptoms. Gastrointestinal The patient has no complaints or symptoms. Endocrine The patient has no complaints or symptoms. Genitourinary Complains or has symptoms of Kidney failure/ Dialysis. Denies complaints or symptoms of Incontinence/dribbling. Immunological The patient has no complaints or symptoms. Integumentary (Skin) Complains or has symptoms of Wounds. Denies complaints or symptoms of Bleeding or bruising tendency, Breakdown, Swelling. Musculoskeletal Denies complaints or symptoms of Muscle [name], Muscle Weakness. Neurologic The patient has no complaints or symptoms. Oncologic The patient has no complaints or symptoms. Psychiatric The patient has no complaints or symptoms. Objective [name], [name] ([id number]) Constitutional Pulse regular. Respirations normal and unlabored. Afebrile. Vitals Time Taken: 1:25 PM, Height: 73 in, Source: Stated, Weight: 183 lbs, Source: Stated, BMI: 24.1, Temperature: 98.3 F, Pulse: 69  bpm, Respiratory Rate: 18 breaths/min, Blood Pressure: 150/87 mmHg. Eyes Nonicteric. Reactive to light. Ears, Nose, Mouth, and Throat Lips, teeth, and gums WNL.[name] Moist mucosa without lesions . Neck supple and nontender. No palpable supraclavicular or cervical adenopathy. Normal sized without goiter. Respiratory WNL. No  retractions.. Cardiovascular Pedal Pulses very febrile and his ABI on the right side is 0.9.. No clubbing, cyanosis or edema. Gastrointestinal (GI) Abdomen without masses or tenderness.. No liver or spleen enlargement or tenderness.[name] Psychiatric Judgement and insight Intact.. No evidence of depression, anxiety, or agitation.. Integumentary (Hair, Skin) superficial ulceration on some of his toes and the forefoot. Severe tenderness on touch. No crepitus or fluctuance. No peri-wound warmth or erythema. No masses.. Wound #1 status is Open. Original cause of wound was Gradually Appeared. The wound is located on the Right,Dorsal Metatarsal head first. The wound measures 1.7cm length x 0.6cm width x 0.1cm depth; 0.801cm^2 area and 0.08cm^3 volume. Wound #2 status is Open. Original cause of wound was Gradually Appeared. The wound is located on the Right,Dorsal Toe Second. The wound measures 3.5cm length x 1.5cm width x 0.1cm depth; 4.123cm^2 area and 0.412cm^3 volume. Wound #3 status is Open. Original cause of wound was Gradually Appeared. The wound is located on the Right,Dorsal Toe Third. The wound measures 3cm length x 1.5cm width x 0.1cm depth; 3.534cm^2 area and 0.353cm^3 volume. Wound #4 status is Open. Original cause of wound was Gradually Appeared. The wound is located on the Right,Dorsal Toe Fourth. The wound measures 1.5cm length x 0.6cm width x 0.1cm depth; 0.707cm^2 area and 0.071cm^3 volume. [name], Joel VELTEN ([id number]) Wound #5 status is Open. Original cause of wound was Gradually Appeared. The wound is located on the Right,Dorsal Metatarsal head fifth. The wound measures 2cm length x 1.6cm width x 0.1cm depth; 2.513cm^2 area and 0.251cm^3 volume. 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 He had a recent arterial duplex which does not show severe arterial insufficiency but has small vessel disease. He has rest [name] and definitely needs to quit smoking and I have spent a great deal of time educating him and counseling him regarding stopping smoking immediately. We will use silver alginate and a very light dressing over this to prevent him from further infection. I've asked him to come to see me on a regular basis and we will review him every week. Plan Wound Cleansing: Wound #1 Right,Dorsal Metatarsal head first: Clean wound with Normal Saline. May Shower, gently pat wound dry prior to applying new dressing. - shower prior to wound care, if no shower prior to wound care then do not shower and cleanse wounds with NS prior to wound care Wound #2 Right,Dorsal Toe Second: Clean wound with Normal Saline. May Shower, gently pat wound dry prior to applying new dressing. - shower prior to wound care, if no shower prior to wound care then do not shower and cleanse wounds with NS prior to wound care Wound #3 Right,Dorsal Toe Third: Clean wound with Normal Saline. May Shower, gently pat wound dry prior to applying new dressing. - shower prior to wound care, if no  shower prior to wound care then do not shower and cleanse wounds with NS prior to wound care [name], [name] ([id number]) Wound #4 Right,Dorsal Toe Fourth: Clean wound with Normal Saline. May Shower, gently pat wound dry prior to applying new dressing. - shower prior to wound care, if no shower prior to wound care then do not shower and cleanse wounds with NS prior to wound care Wound #5 Right,Dorsal Metatarsal head fifth: Clean wound with  Normal Saline. May Shower, gently pat wound dry prior to applying new dressing. - shower prior to wound care, if no shower prior to wound care then do not shower and cleanse wounds with NS prior to wound care Anesthetic: Wound #1 Right,Dorsal Metatarsal head first: Topical Lidocaine 4% cream applied to wound bed prior to debridement Wound #2 Right,Dorsal Toe Second: Topical Lidocaine 4% cream applied to wound bed prior to debridement Wound #3 Right,Dorsal Toe Third: Topical Lidocaine 4% cream applied to wound bed prior to debridement Wound #4 Right,Dorsal Toe Fourth: Topical Lidocaine 4% cream applied to wound bed prior to debridement Wound #5 Right,Dorsal Metatarsal head fifth: Topical Lidocaine 4% cream applied to wound bed prior to debridement Primary Wound Dressing: Wound #1 Right,Dorsal Metatarsal head first: Aquacel Ag - or calcium alginate with silver equivalent Wound #2 Right,Dorsal Toe Second: Aquacel Ag - or calcium alginate with silver equivalent Wound #3 Right,Dorsal Toe Third: Aquacel Ag - or calcium alginate with silver equivalent Wound #4 Right,Dorsal Toe Fourth: Aquacel Ag - or calcium alginate with silver equivalent Wound #5 Right,Dorsal Metatarsal head fifth: Aquacel Ag - or calcium alginate with silver equivalent Secondary Dressing: Wound #1 Right,Dorsal Metatarsal head first: Conform/Kerlix Wound #2 Right,Dorsal Toe Second: Conform/Kerlix Wound #3 Right,Dorsal Toe Third: Conform/Kerlix Wound #4 Right,Dorsal Toe Fourth: Conform/Kerlix Wound #5 Right,Dorsal Metatarsal head fifth: Conform/Kerlix Dressing Change Frequency: Wound #1 Right,Dorsal Metatarsal head first: Change Dressing Monday, Wednesday, Friday Wound #2 Right,Dorsal Toe Second: Change Dressing Monday, Wednesday, Friday Wound #3 Right,Dorsal Toe Third: Change Dressing Monday, Wednesday, Friday Wound #4 Right,Dorsal Toe Fourth: Change Dressing Monday, Wednesday, Friday Wound #5 Right,Dorsal  Metatarsal head fifth: [name], [name] ([id number]) Change Dressing Monday, Wednesday, Friday Follow-up Appointments: Wound #1 Right,Dorsal Metatarsal head first: Return Appointment in 1 week. Wound #2 Right,Dorsal Toe Second: Return Appointment in 1 week. Wound #3 Right,Dorsal Toe Third: Return Appointment in 1 week. Wound #4 Right,Dorsal Toe Fourth: Return Appointment in 1 week. Wound #5 Right,Dorsal Metatarsal head fifth: Return Appointment in 1 week. Additional Orders / Instructions: Wound #1 Right,Dorsal Metatarsal head first: Stop Smoking - please provide patient with nicotine patches Wound #2 Right,Dorsal Toe Second: Stop Smoking - please provide patient with nicotine patches Wound #3 Right,Dorsal Toe Third: Stop Smoking - please provide patient with nicotine patches Wound #4 Right,Dorsal Toe Fourth: Stop Smoking - please provide patient with nicotine patches Wound #5 Right,Dorsal Metatarsal head fifth: Stop Smoking - please provide patient with nicotine patches This 59 year old gentleman who is brought diabetes mellitus, end-stage renal disease, peripheral vascular vascular disease and is status post left BKA continues to smoke. He had a recent arterial duplex which does not show severe arterial insufficiency but has small vessel disease. He has rest [name] and definitely needs to quit smoking and I have spent a great deal of time educating him and counseling him regarding stopping smoking immediately. We will use silver alginate and a very light dressing over this to prevent him from further infection. I've asked him to come to see me on a regular basis  and we will review him every week. Electronic Signature(s) Signed: 11/18/2014 3:11:14 PM By: Christin Fudge MD, FACS Previous Signature: 11/17/2014 5:00:54 PM Version By: Christin Fudge MD, FACS Entered By: Christin Fudge on 11/18/2014 15:11:14 Jain, [name]  ([id number]) -------------------------------------------------------------------------------- ROS/PFSH Details Patient Name: [name], [name] 11/17/2014 1:00 Date of Service: PM Medical Record [id number] Number: Patient Account Number: [id number] Date of Birth/Sex: 01/18/1956 (58 y.o. Male) Treating RN: Montey Hora Primary Care Other Clinician: Tomasa Hose Physician: Treating Delphina Schum, Minerva Areola, Physician/Extender: Referring Physician: Peri Maris in Treatment: 0 Information Obtained From Patient Wound History Do you currently have one or more open woundso Yes How many open wounds do you currently haveo 6 Approximately how long have you had your woundso 1-2 months How have you been treating your wound(s) until nowo nursing home treasting Has your wound(s) ever healed and then re-openedo No Have you had any lab work done in the past montho No Have you tested positive for an antibiotic resistant organism (MRSA, VRE)o No Have you tested positive for osteomyelitis (bone infection)o No Have you had any tests for circulation on your legso Yes Who ordered the testo nursing home Where was the test doneo mobile ex Respiratory Complaints and Symptoms: Negative for: Chronic or frequent coughs; Shortness of Breath Medical History: Positive for: Asthma Negative for: Aspiration; Chronic Obstructive Pulmonary Disease (COPD); Pneumothorax; Tuberculosis Genitourinary Complaints and Symptoms: Positive for: Kidney failure/ Dialysis Negative for: Incontinence/dribbling Medical History: Positive for: End Stage Renal Disease - HD MWF Integumentary (Skin) Complaints and Symptoms: Positive for: Wounds Negative for: Bleeding or bruising tendency; Breakdown; Swelling [place], [name] N. ([id number]) Medical History: Negative for: History of Burn; History of pressure wounds Musculoskeletal Complaints and Symptoms: Negative for: Muscle [name]; Muscle Weakness Medical  History: Negative for: Gout; Rheumatoid Arthritis; Osteoarthritis; Osteomyelitis Past Medical History Notes: L BKA Constitutional Symptoms (General Health) Complaints and Symptoms: No Complaints or Symptoms Eyes Complaints and Symptoms: No Complaints or Symptoms Medical History: Negative for: Cataracts; Glaucoma; Optic Neuritis Ear/Nose/Mouth/Throat Complaints and Symptoms: No Complaints or Symptoms Medical History: Negative for: Chronic sinus problems/congestion; Middle ear problems Hematologic/Lymphatic Complaints and Symptoms: No Complaints or Symptoms Medical History: Positive for: Anemia Negative for: Hemophilia; Human Immunodeficiency Virus; Lymphedema; Sickle Cell Disease Cardiovascular Complaints and Symptoms: No Complaints or Symptoms Medical History: Positive for: Coronary Artery Disease; Hypertension Negative for: Angina; Arrhythmia; Congestive Heart Failure; Deep Vein Thrombosis; Hypotension; [place], [place] ([id number]) Myocardial Infarction; Peripheral Arterial Disease; Peripheral Venous Disease; Phlebitis; Vasculitis Gastrointestinal Complaints and Symptoms: No Complaints or Symptoms Medical History: Negative for: Cirrhosis ; Colitis; Crohnos; Hepatitis A; Hepatitis B; Hepatitis C Endocrine Complaints and Symptoms: No Complaints or Symptoms Medical History: Positive for: Type II Diabetes Negative for: Type I Diabetes Time with diabetes: 6 months Treated with: Diet Blood sugar tested every day: Yes Tested : Immunological Complaints and Symptoms: No Complaints or Symptoms Medical History: Negative for: Lupus Erythematosus; Raynaudos; Scleroderma Neurologic Complaints and Symptoms: No Complaints or Symptoms Medical History: Positive for: Neuropathy Negative for: Dementia; Quadriplegia; Paraplegia; Seizure Disorder Oncologic Complaints and Symptoms: No Complaints or Symptoms Medical History: Negative for: Received Chemotherapy; Received  Radiation Psychiatric Complaints and Symptoms: No Complaints or Symptoms [place], Tywone N. ([id number]) Medical History: Negative for: Anorexia/bulimia; Confinement Anxiety Hospitalization / Surgery History Name of Hospital Purpose of Hospitalization/Surgery Date [facility] L BKA 06/27/2014 Family and Social History Current every day smoker; Marital Status - Widowed; Alcohol Use: Never; Drug Use: Prior History; Caffeine Use: Never; Financial Concerns: No; Food, Clothing or Shelter Needs: No; Support  System Lacking: No; Transportation Concerns: No; Advanced Directives: No; Patient does not want information on Advanced Directives Physician Affirmation I have reviewed and agree with the above information. Electronic Signature(s) Signed: 11/17/2014 5:00:54 PM By: Christin Fudge MD, FACS Signed: 11/17/2014 5:48:32 PM By: Montey Hora Entered By: Christin Fudge on 11/17/2014 14:46:54 Bortner, [name] ([id number]) -------------------------------------------------------------------------------- [place] Details Patient Name: Kight, Marv N. Date of Service: 11/17/2014 Medical Record Number: [id number] Patient Account Number: [id number] Date of Birth/Sex: June 28, 1955 (59 y.o. Male) Treating RN: Primary Care Physician: Tomasa Hose Other Clinician: Referring Physician: Alvester Morin Treating Physician/Extender: Frann Rider in Treatment: 0 Diagnosis Coding ICD-10 Codes Code Description E11.621 Type 2 diabetes mellitus with foot ulcer I70.235 Atherosclerosis of native arteries of right leg with ulceration of other part of foot N18.6 End stage renal disease F17.219 Nicotine dependence, cigarettes, with unspecified nicotine-induced disorders Z89.512 Acquired absence of left leg below knee Facility Procedures CPT4: Description Modifier Quantity Code [id number] - WOUND CARE VISIT-LEV 5 EST PT 1 CPT4: [id number] CESSATION 3-10MINS 1 ICD-10 Description Diagnosis  F17.219 Nicotine dependence, cigarettes, with unspecified nicotine-induced disorders Physician Procedures CPT4: Description Modifier Quantity Code [id number] - WC PHYS LEVEL 4 - NEW PT 1 ICD-10 Description Diagnosis E11.621 Type 2 diabetes mellitus with foot ulcer I70.235 Atherosclerosis of native arteries of right leg with ulceration of other part of  foot N18.6 End stage renal disease Z89.512 Acquired absence of left leg below knee Electronic Signature(s) Signed: 11/17/2014 5:00:54 PM By: Christin Fudge MD, FACS [place], [place] ([id number]) Entered By: Christin Fudge on 11/17/2014 14:44:15

## 2014-11-22 ENCOUNTER — Encounter: Payer: Medicare Other | Admitting: Surgery

## 2014-11-22 DIAGNOSIS — L97519 Non-pressure chronic ulcer of other part of right foot with unspecified severity: Secondary | ICD-10-CM | POA: Diagnosis not present

## 2014-11-22 NOTE — Progress Notes (Addendum)
[name] ([id number]) Visit Report for 11/22/2014 Arrival Information Details Patient Name: [name], [name]. Date of Service: 11/22/2014 8:00 AM Medical Record Number: [id number] Patient Account Number: [id number] Date of Birth/Sex: 07/22/1955 (59 y.o. Male) Treating RN: [name] Primary Care Physician: [name] Other Clinician: Referring Physician: Tomasa [name] Treating Physician/Extender: [name] in Treatment: 0 Visit Information History Since Last Visit Added or deleted any medications: No Patient Arrived: Wheel Chair Any new allergies or adverse reactions: No Arrival Time: 08:10 Had a fall or experienced change in No Accompanied By: self activities of daily living that may affect Transfer Assistance: None risk of falls: Patient Identification Verified: Yes Signs or symptoms of abuse/neglect since last No Secondary Verification Process Yes visito Completed: Hospitalized since last visit: No Patient Has Alerts: Yes Has Dressing in Place as Prescribed: Yes Patient Alerts: Patient on Blood Pain Present Now: Yes Thinner asa 325 DMII Electronic Signature(s) Signed: 11/22/2014 8:12:10 AM By: Joel Cool, RN, BSN, Kim RN, BSN Previous Signature: 11/22/2014 8:11:03 AM Version By: Joel Cool RN, BSN, Kim RN, BSN Previous Signature: 11/22/2014 8:10:41 AM Version By: [name] BSN, RN Entered By: Joel Cool, RN, BSN, [name] on 11/22/2014 08:12:10 [name] ([id number]) -------------------------------------------------------------------------------- Clinic Level of Care Assessment Details Patient Name: [name], [name] Date of Service: 11/22/2014 8:00 AM Medical Record Number: [id number] Patient Account Number: [id number] Date of Birth/Sex: 1955-06-03 (59 y.o. Male) Treating RN: [name] Primary Care Physician: [name] Other Clinician: Referring Physician: Tomasa [name] Treating Physician/Extender: [name] in Treatment: 0 Clinic Level of Care  Assessment Items TOOL 4 Quantity Score []  - Use when only an EandM is performed on FOLLOW-UP visit 0 ASSESSMENTS - Nursing Assessment / Reassessment []  - Reassessment of Co-morbidities (includes updates in patient status) 0 X - Reassessment of Adherence to Treatment Plan 1 5 ASSESSMENTS - Wound and Skin Assessment / Reassessment X - Simple Wound Assessment / Reassessment - one wound 1 5 []  - Complex Wound Assessment / Reassessment - multiple wounds 0 []  - Dermatologic / Skin Assessment (not related to wound area) 0 ASSESSMENTS - Focused Assessment []  - Circumferential Edema Measurements - multi extremities 0 []  - Nutritional Assessment / Counseling / Intervention 0 []  - Lower Extremity Assessment (monofilament, tuning fork, pulses) 0 []  - Peripheral Arterial Disease Assessment (using hand held doppler) 0 ASSESSMENTS - Ostomy and/or Continence Assessment and Care []  - Incontinence Assessment and Management 0 []  - Ostomy Care Assessment and Management (repouching, etc.) 0 PROCESS - Coordination of Care X - Simple Patient / Family Education for ongoing care 1 15 []  - Complex (extensive) Patient / Family Education for ongoing care 0 X - Staff obtains Programmer, systems, Records, Test Results / Process Orders 1 10 []  - Staff telephones HHA, Nursing Homes / Clarify orders / etc 0 []  - Routine Transfer to another Facility (non-emergent condition) 0 [name], [name] ([id number]) []  - Routine Hospital Admission (non-emergent condition) 0 []  - New Admissions / Biomedical engineer / Ordering NPWT, Apligraf, etc. 0 []  - Emergency Hospital Admission (emergent condition) 0 X - Simple Discharge Coordination 1 10 []  - Complex (extensive) Discharge Coordination 0 PROCESS - Special Needs []  - Pediatric / Minor Patient Management 0 []  - Isolation Patient Management 0 []  - Hearing / Language / Visual special needs 0 []  - Assessment of Community assistance (transportation, D/C planning, etc.) 0 []  -  Additional assistance / Altered mentation 0 []  - Support Surface(s) Assessment (bed, cushion, seat, etc.) 0 INTERVENTIONS - Wound Cleansing /  Measurement X - Simple Wound Cleansing - one wound 1 5 []  - Complex Wound Cleansing - multiple wounds 0 X - Wound Imaging (photographs - any number of wounds) 1 5 []  - Wound Tracing (instead of photographs) 0 X - Simple Wound Measurement - one wound 1 5 []  - Complex Wound Measurement - multiple wounds 0 INTERVENTIONS - Wound Dressings []  - Small Wound Dressing one or multiple wounds 0 X - Medium Wound Dressing one or multiple wounds 5 15 []  - Large Wound Dressing one or multiple wounds 0 []  - Application of Medications - topical 0 []  - Application of Medications - injection 0 INTERVENTIONS - Miscellaneous []  - External ear exam 0 [name], [name] ([id number]) []  - Specimen Collection (cultures, biopsies, blood, body fluids, etc.) 0 []  - Specimen(s) / Culture(s) sent or taken to Lab for analysis 0 []  - Patient Transfer (multiple staff / Civil Service fast streamer / Similar devices) 0 []  - Simple Staple / Suture removal (25 or less) 0 []  - Complex Staple / Suture removal (26 or more) 0 []  - Hypo / Hyperglycemic Management (close monitor of Blood Glucose) 0 []  - Ankle / Brachial Index (ABI) - do not check if billed separately 0 X - Vital Signs 1 5 Has the patient been seen at the hospital within the last three years: Yes Total Score: 140 Level Of Care: New/Established - Level 4 Electronic Signature(s) Signed: 11/22/2014 4:58:02 PM By: Joel Cool, RN, BSN, Kim RN, BSN Entered By: Joel Cool, RN, BSN, [name] on 11/22/2014 08:44:24 [name] ([id number]) -------------------------------------------------------------------------------- Encounter Discharge Information Details Patient Name: [name], [name] Date of Service: 11/22/2014 8:00 AM Medical Record Number: [id number] Patient Account Number: [id number] Date of Birth/Sex: 09/07/1955 (59 y.o. Male) Treating  RN: Primary Care Physician: [name] Other Clinician: Referring Physician: Tomasa [name] Treating Physician/Extender: [name] in Treatment: 0 Encounter Discharge Information Items Discharge Pain Level: 3 Discharge Condition: Stable Ambulatory Status: Wheelchair Discharge Destination: Home Transportation: Private Auto Accompanied By: caregiver Schedule Follow-up Appointment: Yes Medication Reconciliation completed and provided to Patient/Care Yes Caliyah Sieh: Provided on Clinical Summary of Care: 11/22/2014 Form Type Recipient Paper Patient RM Electronic Signature(s) Signed: 11/22/2014 4:58:02 PM By: Joel Cool RN, BSN, Kim RN, BSN Previous Signature: 11/22/2014 8:50:54 AM Version By: Ruthine Dose Entered By: Joel Cool RN, BSN, [name] on 11/22/2014 09:03:12 Yum, [name] ([id number]) -------------------------------------------------------------------------------- Lower Extremity Assessment Details Patient Name: Krisher, Ruffus N. Date of Service: 11/22/2014 8:00 AM Medical Record Number: [id number] Patient Account Number: [id number] Date of Birth/Sex: [birth year] (60 y.o. Male) Treating RN: [name] Primary Care Physician: [name] Other Clinician: Referring Physician: Tomasa [name] Treating Physician/Extender: [name] in Treatment: 0 Edema Assessment Assessed: [Left: No] [Right: No] E[Left: dema] [Right: :] Calf Left: Right: Point of Measurement: 36 cm From Medial Instep cm 31.8 cm Ankle Left: Right: Point of Measurement: 11 cm From Medial Instep cm 23.7 cm Vascular Assessment Pulses: Posterior Tibial Dorsalis Pedis Palpable: [Right:Yes] Extremity colors, hair growth, and conditions: Extremity Color: [Right:Normal] Hair Growth on Extremity: [Right:Yes] Temperature of Extremity: [Right:Warm] Capillary Refill: [Right:< 3 seconds] Toe Nail Assessment Left: Right: Thick: No Discolored: No Deformed: No Improper Length and Hygiene:  No Electronic Signature(s) Signed: 11/22/2014 4:58:02 PM By: Joel Cool, RN, BSN, Kim RN, BSN Entered By: Joel Cool, RN, BSN, [name] on 11/22/2014 08:20:57 Blau, [name] ([id number]) -------------------------------------------------------------------------------- Multi Wound Chart Details Patient Name: Somarriba, Daulton N. Date of Service: 11/22/2014 8:00 AM Medical Record Number: [id number] Patient Account Number: [id number] Date of Birth/Sex: 1955-06-15 (58 y.o.  Male) Treating RN: [name] Primary Care Physician: [name] Other Clinician: Referring Physician: Tomasa [name] Treating Physician/Extender: [name] in Treatment: 0 Vital Signs Height(in): 73 Pulse(bpm): 71 Weight(lbs): 183 Blood Pressure 130/78 (mmHg): Body Mass Index(BMI): 24 Temperature(F): 98.3 Respiratory Rate 18 (breaths/min): Photos: [1:No Photos] [2:No Photos] [3:No Photos] Wound Location: [1:Right Metatarsal head first Right Toe Second - Dorsal Right Toe Third - Dorsal - Dorsal] Wounding Event: [1:Gradually Appeared] [2:Gradually Appeared] [3:Gradually Appeared] 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] Comorbid History: [1:Anemia, Asthma, Coronary Artery Disease, Coronary Artery Disease, Coronary Artery Disease, Hypertension, Type II Diabetes, End Stage Renal Disease, Neuropathy] [2:Anemia, Asthma, Hypertension, Type II Diabetes, End Stage Renal  Disease, Neuropathy] [3:Anemia, Asthma, Hypertension, Type II Diabetes, End Stage Renal Disease, Neuropathy] Date Acquired: [1:09/14/2014] [2:09/14/2014] [3:09/14/2014] Weeks of Treatment: [1:0] [2:0] [3:0] Wound Status: [1:Open] [2:Open] [3:Open] Measurements L x W x D 1.8x1x0.1 [2:2.8x1.5x0.1] [3:3x2x0.2] (cm) Area (cm) : [1:1.414] [2:3.299] [3:4.712] Volume (cm) : [1:0.141] [2:0.33] [3:0.942] % Reduction in Area: [1:-76.50%] [2:20.00%] [3:-33.30%] % Reduction in Volume: -76.20% [2:19.90%]  [3:-166.90%] Classification: [1:Partial Thickness] [2:Partial Thickness] [3:Partial Thickness] HBO Classification: [1:Grade 1] [2:Grade 1] [3:Grade 1] Exudate Amount: [1:Small] [2:Small] [3:Small] Exudate Type: [1:Serous] [2:Serous] [3:Serosanguineous] Exudate Color: [1:amber] [2:amber] [3:red, brown] Wound Margin: [1:Distinct, outline attached Distinct, outline attached Distinct, outline attached] Granulation Amount: [1:None Present (0%)] [2:None Present (0%)] [3:None Present (0%)] Necrotic Amount: [1:Large (67-100%)] [2:Large (67-100%)] [3:Large (67-100%)] Necrotic Tissue: [1:Eschar, Adherent Slough Eschar] [3:Eschar, Adherent Slough] Exposed Structures: [name], [name] ([id number]) Fascia: No Fascia: No Fascia: No Fat: No Fat: No Fat: No Tendon: No Tendon: No Tendon: No Muscle: No Muscle: No Muscle: No Joint: No Joint: No Joint: No Bone: No Bone: No Bone: No Limited to Skin Limited to Skin Limited to Skin Breakdown Breakdown Breakdown Epithelialization: None None None Periwound Skin Texture: Edema: No Edema: No Edema: No Excoriation: No Excoriation: No Excoriation: No Induration: No Induration: No Induration: No Callus: No Callus: No Callus: No Crepitus: No Crepitus: No Crepitus: No Fluctuance: No Fluctuance: No Fluctuance: No Friable: No Friable: No Friable: No Rash: No Rash: No Rash: No Scarring: No Scarring: No Scarring: No Periwound Skin Dry/Scaly: Yes Maceration: No Maceration: No Moisture: Maceration: No Moist: No Moist: No Moist: No Dry/Scaly: No Dry/Scaly: No Periwound Skin Color: Atrophie Blanche: No Atrophie Blanche: No Atrophie Blanche: No Cyanosis: No Cyanosis: No Cyanosis: No Ecchymosis: No Ecchymosis: No Ecchymosis: No Erythema: No Erythema: No Erythema: No Hemosiderin Staining: No Hemosiderin Staining: No Hemosiderin Staining: No Mottled: No Mottled: No Mottled: No Pallor: No Pallor: No Pallor: No Rubor:  No Rubor: No Rubor: No Tenderness on No No No Palpation: Wound Preparation: Ulcer Cleansing: Ulcer Cleansing: Ulcer Cleansing: Rinsed/Irrigated with Rinsed/Irrigated with Rinsed/Irrigated with Saline Saline Saline Topical Anesthetic Topical Anesthetic Topical Anesthetic Applied: Other: lidocaine Applied: Other: lidocaine Applied: Other: lidocaine 4% 4% 4% Wound Number: 4 5 N/A Photos: No Photos No Photos N/A Wound Location: Right Toe Fourth - Dorsal Right Metatarsal head fifth N/A - Dorsal Wounding Event: Gradually Appeared Gradually Appeared N/A 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 N/A Comorbid History: Anemia, Asthma, Anemia, Asthma, N/A Coronary Artery Disease, Coronary Artery Disease, Hypertension, Type II Hypertension, Type II Diabetes, End Stage Diabetes, End Stage [name], [name] ([id number]) Renal Disease, Renal Disease, Neuropathy Neuropathy Date Acquired: 09/14/2014 09/14/2014 N/A Weeks of Treatment: 0 0 N/A Wound Status: Open Open N/A Measurements L x W x D 1.3x0.7x0.1 1.8x1.5x0.1 N/A (cm) Area (cm) : 0.715 2.121  N/A Volume (cm) : 0.071 0.212 N/A % Reduction in Area: -1.10% 15.60% N/A % Reduction in Volume: 0.00% 15.50% N/A Classification: Partial Thickness Partial Thickness N/A HBO Classification: Grade 1 Grade 1 N/A Exudate Amount: Small Small N/A Exudate Type: Serosanguineous Serosanguineous N/A Exudate Color: red, brown red, brown N/A Wound Margin: Distinct, outline attached Distinct, outline attached N/A Granulation Amount: None Present (0%) None Present (0%) N/A Necrotic Amount: Large (67-100%) Large (67-100%) N/A Necrotic Tissue: Eschar Eschar, Adherent Slough N/A Exposed Structures: Fascia: No Fascia: No N/A Fat: No Fat: No Tendon: No Tendon: No Muscle: No Muscle: No Joint: No Joint: No Bone: No Bone: No Limited to Skin Limited to Skin Breakdown Breakdown Epithelialization: N/A None N/A Periwound  Skin Texture: Edema: No Edema: No N/A Excoriation: No Excoriation: No Induration: No Induration: No Callus: No Callus: No Crepitus: No Crepitus: No Fluctuance: No Fluctuance: No Friable: No Friable: No Rash: No Rash: No Scarring: No Scarring: No Periwound Skin Maceration: No Maceration: No N/A Moisture: Moist: No Moist: No Dry/Scaly: No Dry/Scaly: No Periwound Skin Color: Atrophie Blanche: No Atrophie Blanche: No N/A Cyanosis: No Cyanosis: No Ecchymosis: No Ecchymosis: No Erythema: No Erythema: No Hemosiderin Staining: No Hemosiderin Staining: No Mottled: No Mottled: No Pallor: No Pallor: No Rubor: No Rubor: No No No N/A [name], [name] ([id number]) Tenderness on Palpation: Wound Preparation: Ulcer Cleansing: Ulcer Cleansing: N/A Rinsed/Irrigated with Rinsed/Irrigated with Saline Saline Topical Anesthetic Topical Anesthetic Applied: Other: lidocaine Applied: Other: lidocaine 4% 4% Treatment Notes Electronic Signature(s) Signed: 11/22/2014 4:58:02 PM By: Joel Cool, RN, BSN, Kim RN, BSN Entered By: Joel Cool, RN, BSN, [name] on 11/22/2014 08:40:32 [name], [name] ([id number]) -------------------------------------------------------------------------------- [place] Details Patient Name: Costanza, Colston N. Date of Service: 11/22/2014 8:00 AM Medical Record Number: [id number] Patient Account Number: [id number] Date of Birth/Sex: 1956/04/14 (59 y.o. Male) Treating RN: [name] Primary Care Physician: [name] Other Clinician: Referring Physician: Tomasa [name] Treating Physician/Extender: [name] in Treatment: 0 Active Inactive Abuse / Safety / Falls / Self Care Management Nursing Diagnoses: Potential for falls Goals: Patient will remain injury free Date Initiated: 11/17/2014 Goal Status: Active Interventions: Assess fall risk on admission and as needed Notes: Orientation to the Wound Care Program Nursing  Diagnoses: Knowledge deficit related to the wound healing center program Goals: Patient/caregiver will verbalize understanding of the [place] Program Date Initiated: 11/17/2014 Goal Status: Active Interventions: Provide education on orientation to the wound center Notes: Wound/Skin Impairment Nursing Diagnoses: Knowledge deficit related to smoking impact on wound healing Goals: Patient will demonstrate a reduced rate of smoking or cessation of smoking Date Initiated: 11/17/2014 MCNEILDontae, Diantonio ([id number]) Goal Status: Active Ulcer/skin breakdown will have a volume reduction of 30% by week 4 Date Initiated: 11/17/2014 Goal Status: Active Interventions: Assess ulceration(s) every visit Provide education on smoking Notes: Electronic Signature(s) Signed: 11/22/2014 4:58:02 PM By: Joel Cool, RN, BSN, Kim RN, BSN Entered By: Joel Cool, RN, BSN, [name] on 11/22/2014 08:39:15 [name], [name] ([id number]) -------------------------------------------------------------------------------- Pain Assessment Details Patient Name: Kandel, Deiondre N. Date of Service: 11/22/2014 8:00 AM Medical Record Number: [id number] Patient Account Number: [id number] Date of Birth/Sex: 07/02/1955 (59 y.o. Male) Treating RN: [name] Primary Care Physician: [name] Other Clinician: Referring Physician: Tomasa [name] Treating Physician/Extender: [name] in Treatment: 0 Active Problems Location of Pain Severity and Description of Pain Patient Has Paino Yes Site Locations Pain Location: Pain in Ulcers Rate the pain. Current Pain Level: 10 Character of Pain Describe the Pain: Aching, Burning, Throbbing Pain  Management and Medication Current Pain Management: Electronic Signature(s) Signed: 11/22/2014 8:13:22 AM By: Joel Cool, RN, BSN, Kim RN, BSN Entered By: Joel Cool, RN, BSN, [name] on 11/22/2014 08:13:22 [name], [name]  ([id number]) -------------------------------------------------------------------------------- Patient/Caregiver Education Details Patient Name: Slevin, [name]. Date of Service: 11/22/2014 8:00 AM Medical Record Number: [id number] Patient Account Number: [id number] Date of Birth/Gender: 07-Dec-1955 (60 y.o. Male) Treating RN: [name] Primary Care Physician: [name] Other Clinician: Referring Physician: Tomasa [name] Treating Physician/Extender: [name] in Treatment: 0 Education Assessment Education Provided To: Caregiver Education Topics Provided Smoking and Wound Healing: Handouts: Smoking and Wound Healing, Other: continue with smoking cessation Electronic Signature(s) Signed: 11/22/2014 4:58:02 PM By: Joel Cool, RN, BSN, Kim RN, BSN Entered By: Joel Cool, RN, BSN, [name] on 11/22/2014 09:03:39 [name], [name] ([id number]) -------------------------------------------------------------------------------- Wound Assessment Details Patient Name: Amspacher, Kameran N. Date of Service: 11/22/2014 8:00 AM Medical Record Number: [id number] Patient Account Number: [id number] Date of Birth/Sex: 1956/04/14 (59 y.o. Male) Treating RN: [name] Primary Care Physician: [name] Other Clinician: Referring Physician: Tomasa [name] Treating Physician/Extender: [name] in Treatment: 0 Wound Status Wound Number: 1 Primary Arterial Insufficiency Ulcer Etiology: Wound Location: Right Metatarsal head first - Dorsal Wound Open Status: Wounding Event: Gradually Appeared Comorbid Anemia, Asthma, Coronary Artery Date Acquired: 09/14/2014 History: Disease, Hypertension, Type II Weeks Of Treatment: 0 Diabetes, End Stage Renal Disease, Clustered Wound: No Neuropathy Photos Photo Uploaded By: Joel Cool, RN, BSN, [name] on 11/22/2014 10:03:43 Wound Measurements Length: (cm) 1.8 Width: (cm) 1 Depth: (cm) 0.1 Area: (cm) 1.414 Volume: (cm) 0.141 % Reduction in Area: -76.5% %  Reduction in Volume: -76.2% Epithelialization: None Wound Description Classification: Partial Thickness Diabetic Severity Earleen Newport): Grade 1 Wound Margin: Distinct, outline attached Tesoro, Aldahir N. ([id number]) Exudate Amount: Small Exudate Type: Serous Exudate Color: amber Wound Bed Granulation Amount: None Present (0%) Exposed Structure Necrotic Amount: Large (67-100%) Fascia Exposed: No Necrotic Quality: Eschar, Adherent Slough Fat Layer Exposed: No Tendon Exposed: No Muscle Exposed: No Joint Exposed: No Bone Exposed: No Limited to Skin Breakdown Periwound Skin Texture Texture Color No Abnormalities Noted: No No Abnormalities Noted: No Callus: No Atrophie Blanche: No Crepitus: No Cyanosis: No Excoriation: No Ecchymosis: No Fluctuance: No Erythema: No Friable: No Hemosiderin Staining: No Induration: No Mottled: No Localized Edema: No Pallor: No Rash: No Rubor: No Scarring: No Moisture No Abnormalities Noted: No Dry / Scaly: Yes Maceration: No Moist: No Wound Preparation Ulcer Cleansing: Rinsed/Irrigated with Saline Topical Anesthetic Applied: Other: lidocaine 4%, Treatment Notes Wound #1 (Right, Dorsal Metatarsal head first) 1. Cleansed with: Clean wound with Normal Saline 2. Anesthetic Topical Lidocaine 4% cream to wound bed prior to debridement 4. Dressing Applied: Aquacel Ag 5. Secondary Dressing Applied Kerlix/Conform Frappier, [name] ([id number]) Notes No tape on skin, stretch net to secure Electronic Signature(s) Signed: 11/22/2014 4:58:02 PM By: Joel Cool, RN, BSN, Kim RN, BSN Entered By: Joel Cool, RN, BSN, [name] on 11/22/2014 08:28:10 [name], [name] ([id number]) -------------------------------------------------------------------------------- Wound Assessment Details Patient Name: Horsey, Rodriguez N. Date of Service: 11/22/2014 8:00 AM Medical Record Number: [id number] Patient Account Number: [id number] Date of Birth/Sex: [birth year] (59 y.o.  Male) Treating RN: [name] Primary Care Physician: [name] Other Clinician: Referring Physician: Tomasa [name] Treating Physician/Extender: [name] in Treatment: 0 Wound Status Wound Number: 2 Primary Arterial Insufficiency Ulcer Etiology: Wound Location: Right Toe Second - Dorsal Wound Open Wounding Event: Gradually Appeared Status: Date Acquired: 09/14/2014 Comorbid Anemia, Asthma, Coronary Artery Weeks Of Treatment: 0 History: Disease, Hypertension, Type II Clustered Wound:  No Diabetes, End Stage Renal Disease, Neuropathy Photos Photo Uploaded By: Joel Cool, RN, BSN, [name] on 11/22/2014 10:03:44 Wound Measurements Length: (cm) 2.8 Width: (cm) 1.5 Depth: (cm) 0.1 Area: (cm) 3.299 Volume: (cm) 0.33 % Reduction in Area: 20% % Reduction in Volume: 19.9% Epithelialization: None Wound Description Classification: Partial Thickness Diabetic Severity Earleen Newport): Grade 1 Wound Margin: Distinct, outline attached Exudate Amount: Small Exudate Type: Serous Exudate Color: amber Wound Bed Granulation Amount: None Present (0%) Exposed Structure Necrotic Amount: Large (67-100%) Fascia Exposed: No Escajeda, Jacquelyn N. ([id number]) Necrotic Quality: Eschar Fat Layer Exposed: No Tendon Exposed: No Muscle Exposed: No Joint Exposed: No Bone Exposed: No Limited to Skin Breakdown Periwound Skin Texture Texture Color No Abnormalities Noted: No No Abnormalities Noted: No Callus: No Atrophie Blanche: No Crepitus: No Cyanosis: No Excoriation: No Ecchymosis: No Fluctuance: No Erythema: No Friable: No Hemosiderin Staining: No Induration: No Mottled: No Localized Edema: No Pallor: No Rash: No Rubor: No Scarring: No Moisture No Abnormalities Noted: No Dry / Scaly: No Maceration: No Moist: No Wound Preparation Ulcer Cleansing: Rinsed/Irrigated with Saline Topical Anesthetic Applied: Other: lidocaine 4%, Treatment Notes Wound #2 (Right, Dorsal Toe  Second) 1. Cleansed with: Clean wound with Normal Saline 2. Anesthetic Topical Lidocaine 4% cream to wound bed prior to debridement 4. Dressing Applied: Aquacel Ag 5. Secondary Dressing Applied Kerlix/Conform Notes No tape on skin, stretch net to secure Electronic Signature(s) Signed: 11/22/2014 4:58:02 PM By: Joel Cool, RN, BSN, Kim RN, BSN Entered By: Joel Cool, RN, BSN, [name] on 11/22/2014 08:29:33 Belles, [name] ([id number]) Dilling, [place] ([id number]) -------------------------------------------------------------------------------- Wound Assessment Details Patient Name: Bose, Nikesh N. Date of Service: 11/22/2014 8:00 AM Medical Record Number: [id number] Patient Account Number: [id number] Date of Birth/Sex: July 06, 1955 (59 y.o. Male) Treating RN: [name] Primary Care Physician: [name] Other Clinician: Referring Physician: Tomasa [name] Treating Physician/Extender: [name] in Treatment: 0 Wound Status Wound Number: 3 Primary Arterial Insufficiency Ulcer Etiology: Wound Location: Right Toe Third - Dorsal Wound Open Wounding Event: Gradually Appeared Status: Date Acquired: 09/14/2014 Comorbid Anemia, Asthma, Coronary Artery Weeks Of Treatment: 0 History: Disease, Hypertension, Type II Clustered Wound: No Diabetes, End Stage Renal Disease, Neuropathy Photos Photo Uploaded By: Joel Cool, RN, BSN, [name] on 11/22/2014 10:04:03 Wound Measurements Length: (cm) 3 Width: (cm) 2 Depth: (cm) 0.2 Area: (cm) 4.712 Volume: (cm) 0.942 % Reduction in Area: -33.3% % Reduction in Volume: -166.9% Epithelialization: None Wound Description Classification: Partial Thickness Diabetic Severity (Wagner): Grade 1 Wound Margin: Distinct, outline attached Exudate Amount: Small Exudate Type: Serosanguineous Exudate Color: red, brown Wound Bed Granulation Amount: None Present (0%) Exposed Structure Necrotic Amount: Large (67-100%) Fascia Exposed: No Carew, Maikol N.  ([id number]) Necrotic Quality: Eschar, Adherent Slough Fat Layer Exposed: No Tendon Exposed: No Muscle Exposed: No Joint Exposed: No Bone Exposed: No Limited to Skin Breakdown Periwound Skin Texture Texture Color No Abnormalities Noted: No No Abnormalities Noted: No Callus: No Atrophie Blanche: No Crepitus: No Cyanosis: No Excoriation: No Ecchymosis: No Fluctuance: No Erythema: No Friable: No Hemosiderin Staining: No Induration: No Mottled: No Localized Edema: No Pallor: No Rash: No Rubor: No Scarring: No Moisture No Abnormalities Noted: No Dry / Scaly: No Maceration: No Moist: No Wound Preparation Ulcer Cleansing: Rinsed/Irrigated with Saline Topical Anesthetic Applied: Other: lidocaine 4%, Treatment Notes Wound #3 (Right, Dorsal Toe Third) 1. Cleansed with: Clean wound with Normal Saline 2. Anesthetic Topical Lidocaine 4% cream to wound bed prior to debridement 4. Dressing Applied: Aquacel Ag 5. Secondary Dressing Applied Kerlix/Conform Notes No tape on  skin, stretch net to secure Electronic Signature(s) Signed: 11/22/2014 4:58:02 PM By: Joel Cool, RN, BSN, Kim RN, BSN Entered By: Joel Cool, RN, BSN, [name] on 11/22/2014 08:30:15 [place], [name] ([id number]) [place], [place] ([id number]) -------------------------------------------------------------------------------- Wound Assessment Details Patient Name: Kaney, Aria N. Date of Service: 11/22/2014 8:00 AM Medical Record Number: [id number] Patient Account Number: [id number] Date of Birth/Sex: Sep 29, 1955 (59 y.o. Male) Treating RN: [name] Primary Care Physician: [name] Other Clinician: Referring Physician: Tomasa [name] Treating Physician/Extender: [name] in Treatment: 0 Wound Status Wound Number: 4 Primary Arterial Insufficiency Ulcer Etiology: Wound Location: Right Toe Fourth - Dorsal Wound Open Wounding Event: Gradually Appeared Status: Date Acquired: 09/14/2014 Comorbid Anemia,  Asthma, Coronary Artery Weeks Of Treatment: 0 History: Disease, Hypertension, Type II Clustered Wound: No Diabetes, End Stage Renal Disease, Neuropathy Photos Photo Uploaded By: Joel Cool, RN, BSN, [name] on 11/22/2014 10:04:04 Wound Measurements Length: (cm) 1.3 Width: (cm) 0.7 Depth: (cm) 0.1 Area: (cm) 0.715 Volume: (cm) 0.071 % Reduction in Area: -1.1% % Reduction in Volume: 0% Wound Description Classification: Partial Thickness Diabetic Severity (Wagner): Grade 1 Wound Margin: Distinct, outline attached Exudate Amount: Small Exudate Type: Serosanguineous Exudate Color: red, brown Wound Bed Granulation Amount: None Present (0%) Exposed Structure Necrotic Amount: Large (67-100%) Fascia Exposed: No [place], Davion N. ([id number]) Necrotic Quality: Eschar Fat Layer Exposed: No Tendon Exposed: No Muscle Exposed: No Joint Exposed: No Bone Exposed: No Limited to Skin Breakdown Periwound Skin Texture Texture Color No Abnormalities Noted: No No Abnormalities Noted: No Callus: No Atrophie Blanche: No Crepitus: No Cyanosis: No Excoriation: No Ecchymosis: No Fluctuance: No Erythema: No Friable: No Hemosiderin Staining: No Induration: No Mottled: No Localized Edema: No Pallor: No Rash: No Rubor: No Scarring: No Moisture No Abnormalities Noted: No Dry / Scaly: No Maceration: No Moist: No Wound Preparation Ulcer Cleansing: Rinsed/Irrigated with Saline Topical Anesthetic Applied: Other: lidocaine 4%, Treatment Notes Wound #4 (Right, Dorsal Toe Fourth) 1. Cleansed with: Clean wound with Normal Saline 2. Anesthetic Topical Lidocaine 4% cream to wound bed prior to debridement 4. Dressing Applied: Aquacel Ag 5. Secondary Dressing Applied Kerlix/Conform Notes No tape on skin, stretch net to secure Electronic Signature(s) Signed: 11/22/2014 4:58:02 PM By: Joel Cool, RN, BSN, Kim RN, BSN Entered By: Joel Cool, RN, BSN, [name] on 11/22/2014 08:30:53 [place], [name]  ([id number]) [place], [place] ([id number]) -------------------------------------------------------------------------------- Wound Assessment Details Patient Name: Begeman, Latrell N. Date of Service: 11/22/2014 8:00 AM Medical Record Number: [id number] Patient Account Number: [id number] Date of Birth/Sex: 01-16-1956 (59 y.o. Male) Treating RN: [name] Primary Care Physician: [name] Other Clinician: Referring Physician: Tomasa [name] Treating Physician/Extender: [name] in Treatment: 0 Wound Status Wound Number: 5 Primary Arterial Insufficiency Ulcer Etiology: Wound Location: Right Metatarsal head fifth - Dorsal Wound Open Status: Wounding Event: Gradually Appeared Comorbid Anemia, Asthma, Coronary Artery Date Acquired: 09/14/2014 History: Disease, Hypertension, Type II Weeks Of Treatment: 0 Diabetes, End Stage Renal Disease, Clustered Wound: No Neuropathy Photos Photo Uploaded By: Joel Cool, RN, BSN, [name] on 11/22/2014 10:04:22 Wound Measurements Length: (cm) 1.8 Width: (cm) 1.5 Depth: (cm) 0.1 Area: (cm) 2.121 Volume: (cm) 0.212 % Reduction in Area: 15.6% % Reduction in Volume: 15.5% Epithelialization: None Wound Description Classification: Partial Thickness Diabetic Severity Earleen Newport): Grade 1 Wound Margin: Distinct, outline attached Exudate Amount: Small Exudate Type: Serosanguineous Exudate Color: red, brown Wound Bed Granulation Amount: None Present (0%) Exposed Structure Necrotic Amount: Large (67-100%) Fascia Exposed: No [place], Nidal N. ([id number]) Necrotic Quality: Eschar, Adherent Slough Fat Layer Exposed: No Tendon Exposed: No  Muscle Exposed: No Joint Exposed: No Bone Exposed: No Limited to Skin Breakdown Periwound Skin Texture Texture Color No Abnormalities Noted: No No Abnormalities Noted: No Callus: No Atrophie Blanche: No Crepitus: No Cyanosis: No Excoriation: No Ecchymosis: No Fluctuance: No Erythema: No Friable:  No Hemosiderin Staining: No Induration: No Mottled: No Localized Edema: No Pallor: No Rash: No Rubor: No Scarring: No Moisture No Abnormalities Noted: No Dry / Scaly: No Maceration: No Moist: No Wound Preparation Ulcer Cleansing: Rinsed/Irrigated with Saline Topical Anesthetic Applied: Other: lidocaine 4%, Treatment Notes Wound #5 (Right, Dorsal Metatarsal head fifth) 1. Cleansed with: Clean wound with Normal Saline 2. Anesthetic Topical Lidocaine 4% cream to wound bed prior to debridement 4. Dressing Applied: Aquacel Ag 5. Secondary Dressing Applied Kerlix/Conform Notes No tape on skin, stretch net to secure Electronic Signature(s) Signed: 11/22/2014 4:58:02 PM By: Joel Cool, RN, BSN, Kim RN, BSN Entered By: Joel Cool, RN, BSN, [name] on 11/22/2014 08:31:34 [place], [name] ([id number]) [place], [place] ([id number]) -------------------------------------------------------------------------------- [place] Details Patient Name: Icard, Solan N. Date of Service: 11/22/2014 8:00 AM Medical Record Number: [id number] Patient Account Number: [id number] Date of Birth/Sex: 17-Apr-1956 (59 y.o. Male) Treating RN: [name] Primary Care Physician: [name] Other Clinician: Referring Physician: Tomasa [name] Treating Physician/Extender: [name] in Treatment: 0 Vital Signs Time Taken: 08:13 Temperature (F): 98.3 Height (in): 73 Pulse (bpm): 71 Weight (lbs): 183 Respiratory Rate (breaths/min): 18 Body Mass Index (BMI): 24.1 Blood Pressure (mmHg): 130/78 Reference Range: 80 - 120 mg / dl Electronic Signature(s) Signed: 11/22/2014 8:16:29 AM By: Joel Cool, RN, BSN, Kim RN, BSN Entered By: Joel Cool, RN, BSN, [name] on 11/22/2014 [id number]

## 2014-11-23 NOTE — Progress Notes (Signed)
Deblasi, KAINE GILLYARD ([id number]) Visit Report for 11/22/2014 Chief Complaint Document Details Patient Name: [name], [name]. Date of Service: 11/22/2014 8:00 AM Medical Record Number: [id number] Patient Account Number: [id number] Date of Birth/Sex: February 13, 1956 (59 y.o. Male) Treating RN: Primary Care Physician: Tomasa Hose Other Clinician: Referring Physician: Tomasa Hose Treating Physician/Extender: Frann Rider in Treatment: 0 Information Obtained from: Patient Chief Complaint Patient presents to the wound care center today with an open arterial ulcer, on his right foot and also severe pain for several weeks. Electronic Signature(s) Signed: 11/22/2014 12:30:49 PM By: Christin Fudge MD, FACS Entered By: Christin Fudge on 11/22/2014 09:04:55 [name], [name] ([id number]) -------------------------------------------------------------------------------- HPI Details Patient Name: [name], [name] Date of Service: 11/22/2014 8:00 AM Medical Record Number: [id number] Patient Account Number: [id number] Date of Birth/Sex: 08-25-1955 (59 y.o. Male) Treating RN: Primary Care Physician: Tomasa Hose Other Clinician: Referring Physician: Tomasa Hose Treating Physician/Extender: Frann Rider in Treatment: 0 History of Present Illness Location: right foot pain with ulcers on his forefoot Quality: Patient reports experiencing burning to affected area(s). Severity: Patient states wound are getting worse. Duration: Patient has had the wound for > 3 months prior to seeking treatment at the wound center Timing: Pain in wound is constant (hurts all the time) Context: The wound appeared gradually over time Modifying Factors: Consults to this date include: vascular consult for angioplasty Associated Signs and Symptoms: Patient reports having: difficulty in using his prosthesis and is wheelchair-bound. HPI Description: Seen by Dr. Leotis Pain on 08/29/2014 with ulceration on 2 toes on the  right foot and dark discoloration worrisome for gangrenous changes on his 2nd and 3rd toes as well.o Given his previous history of below-knee amputation for peripheral disease and ulceration on the left, his multiple atherosclerotic risk factors and his poorly palpable pedal pulses, he was brought in for angiography for further evaluation and potential treatment. He had a percutaneous angioplasty of the right popliteal artery and the right anterior tibial artery and dorsalis pedis artery. He also had angioplasty of the right peroneal artery and the proximal and mid right posterior tibial artery. On 11/07/2014 he's had a duplex of the right lower extremity which showed the ABI was 0.9 and had a multiphasic waveform. Impression was of mild peripheral vascular disease predominantly small vessel disease right ankle. 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 11/22/2014 -- he has been off cigarettes completely since Saturday and is on a nicotine patch and have commended him about this. Electronic Signature(s) Signed: 11/22/2014 12:30:49 PM By: Christin Fudge MD, FACS Entered By: Christin Fudge on 11/22/2014 09:05:20 [name], [name] ([id number]) -------------------------------------------------------------------------------- Physical Exam Details Patient Name: [name], [name] Date of Service: 11/22/2014 8:00 AM Medical Record Number: [id number] Patient Account Number: [id number] Date of Birth/Sex: 03-Aug-1955 (59 y.o. Male) Treating RN: Primary Care Physician: Tomasa Hose Other Clinician: Referring Physician: Tomasa Hose Treating Physician/Extender: Frann Rider in Treatment: 0 Constitutional . Pulse regular. Respirations normal and unlabored. Afebrile. . Eyes Nonicteric. Reactive to light. Ears, Nose, Mouth, and Throat Lips, teeth, and gums WNL.[name] Moist mucosa without lesions . Neck supple and  nontender. No palpable supraclavicular or cervical adenopathy. Normal sized without goiter. Respiratory WNL. No retractions.. Cardiovascular Pedal Pulses WNL. No clubbing, cyanosis or edema. Integumentary (Hair, Skin) scattered wounds on the right forefoot and in between his toes and most of them are fairly dry but have severe tenderness on touch.. No crepitus or fluctuance. No peri-wound warmth  or erythema. No masses.[name] Psychiatric Judgement and insight Intact.. No evidence of depression, anxiety, or agitation.. Electronic Signature(s) Signed: 11/22/2014 12:30:49 PM By: Christin Fudge MD, FACS Entered By: Christin Fudge on 11/22/2014 09:06:04 [name], [name] ([id number]) -------------------------------------------------------------------------------- Physician Orders Details Patient Name: [name], [name] Date of Service: 11/22/2014 8:00 AM Medical Record Number: [id number] Patient Account Number: [id number] Date of Birth/Sex: 18-Jun-1955 (59 y.o. Male) Treating RN: Cornell Barman Primary Care Physician: Tomasa Hose Other Clinician: Referring Physician: Tomasa Hose Treating Physician/Extender: Frann Rider in Treatment: 0 Verbal / Phone Orders: Yes Clinician: Cornell Barman Read Back and Verified: Yes Diagnosis Coding Wound Cleansing Wound #1 Right,Dorsal Metatarsal head first o Clean wound with Normal Saline. o May Shower, gently pat wound dry prior to applying new dressing. - shower prior to wound care, if no shower prior to wound care then do not shower and cleanse wounds with NS prior to wound care Wound #2 Right,Dorsal Toe Second o Clean wound with Normal Saline. o May Shower, gently pat wound dry prior to applying new dressing. - shower prior to wound care, if no shower prior to wound care then do not shower and cleanse wounds with NS prior to wound care Wound #3 Right,Dorsal Toe Third o Clean wound with Normal Saline. o May Shower, gently pat wound dry prior to  applying new dressing. - shower prior to wound care, if no shower prior to wound care then do not shower and cleanse wounds with NS prior to wound care Wound #4 Right,Dorsal Toe Fourth o Clean wound with Normal Saline. o May Shower, gently pat wound dry prior to applying new dressing. - shower prior to wound care, if no shower prior to wound care then do not shower and cleanse wounds with NS prior to wound care Wound #5 Right,Dorsal Metatarsal head fifth o Clean wound with Normal Saline. o May Shower, gently pat wound dry prior to applying new dressing. - shower prior to wound care, if no shower prior to wound care then do not shower and cleanse wounds with NS prior to wound care Anesthetic Wound #1 Right,Dorsal Metatarsal head first o Topical Lidocaine 4% cream applied to wound bed prior to debridement Wound #2 Right,Dorsal Toe Second o Topical Lidocaine 4% cream applied to wound bed prior to debridement Wound #3 Right,Dorsal Toe Third o Topical Lidocaine 4% cream applied to wound bed prior to debridement Wound #4 Right,Dorsal Toe Fourth o Topical Lidocaine 4% cream applied to wound bed prior to debridement [name], [name] ([id number]) Wound #5 Right,Dorsal Metatarsal head fifth o Topical Lidocaine 4% cream applied to wound bed prior to debridement Primary Wound Dressing Wound #1 Right,Dorsal Metatarsal head first o Aquacel Ag - or calcium alginate with silver equivalent Wound #2 Right,Dorsal Toe Second o Aquacel Ag - or calcium alginate with silver equivalent Wound #3 Right,Dorsal Toe Third o Aquacel Ag - or calcium alginate with silver equivalent Wound #4 Right,Dorsal Toe Fourth o Aquacel Ag - or calcium alginate with silver equivalent Wound #5 Right,Dorsal Metatarsal head fifth o Aquacel Ag - or calcium alginate with silver equivalent Secondary Dressing Wound #1 Right,Dorsal Metatarsal head first o Conform/Kerlix Wound #2 Right,Dorsal Toe  Second o Conform/Kerlix Wound #3 Right,Dorsal Toe Third o Conform/Kerlix Wound #4 Right,Dorsal Toe Fourth o Conform/Kerlix Wound #5 Right,Dorsal Metatarsal head fifth o Conform/Kerlix Dressing Change Frequency Wound #1 Right,Dorsal Metatarsal head first o Change Dressing Monday, Wednesday, Friday Wound #2 Right,Dorsal Toe Second o Change Dressing Monday, Wednesday, Friday Wound #3 Right,Dorsal Toe Third o  Change Dressing Monday, Wednesday, Friday Wound #4 Right,Dorsal Toe Fourth o Change Dressing Monday, Wednesday, Friday [name], DIEFENBACH ([id number]) Wound #5 Right,Dorsal Metatarsal head fifth o Change Dressing Monday, Wednesday, Friday Follow-up Appointments Wound #1 Right,Dorsal Metatarsal head first o Return Appointment in 1 week. Wound #2 Right,Dorsal Toe Second o Return Appointment in 1 week. Wound #3 Right,Dorsal Toe Third o Return Appointment in 1 week. Wound #4 Right,Dorsal Toe Fourth o Return Appointment in 1 week. Wound #5 Right,Dorsal Metatarsal head fifth o Return Appointment in 1 week. Additional Orders / Instructions Wound #1 Right,Dorsal Metatarsal head first o Stop Smoking - please provide patient with nicotine patches Wound #2 Right,Dorsal Toe Second o Stop Smoking - please provide patient with nicotine patches Wound #3 Right,Dorsal Toe Third o Stop Smoking - please provide patient with nicotine patches Wound #4 Right,Dorsal Toe Fourth o Stop Smoking - please provide patient with nicotine patches Wound #5 Right,Dorsal Metatarsal head fifth o Stop Smoking - please provide patient with nicotine patches Electronic Signature(s) Signed: 11/22/2014 12:30:49 PM By: Christin Fudge MD, FACS Signed: 11/22/2014 4:58:02 PM By: Gretta Cool RN, BSN, Kim RN, BSN Entered By: Gretta Cool, RN, BSN, Kim on 11/22/2014 08:43:00 Skalla, [name] ([id number]) -------------------------------------------------------------------------------- Problem  List Details Patient Name: Genson, Orlandis N. Date of Service: 11/22/2014 8:00 AM Medical Record Number: [id number] Patient Account Number: [id number] Date of Birth/Sex: [birth year] (59 y.o. 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 Date of Service: 11/22/2014 8:00 AM Medical Record Number: [id number] Patient Account Number: [id number] Date of Birth/Sex: 1955-12-31 (59 y.o. Male) Treating RN: Primary Care Physician: Tomasa Hose Other Clinician: Referring Physician: Tomasa Hose Treating Physician/Extender: Frann Rider in Treatment: 0 Subjective Chief Complaint Information obtained from Patient Patient presents to the wound care center today with an open arterial ulcer, on his right foot and also severe pain for several weeks. History of Present Illness (HPI) The following HPI elements were documented for the patient's wound: Location: right foot pain with ulcers on his forefoot Quality: Patient reports experiencing burning to affected area(s). Severity: Patient states wound  are getting worse. Duration: Patient has had the wound for > 3 months prior to seeking treatment at the wound center Timing: Pain in wound is constant (hurts all the time) Context: The wound appeared gradually over time Modifying Factors: Consults to this date include: vascular consult for angioplasty Associated Signs and Symptoms: Patient reports having: difficulty in using his prosthesis and is wheelchair- bound. Seen by Dr. Leotis Pain on 08/29/2014 with ulceration on 2 toes on the right foot and dark discoloration worrisome for gangrenous changes on his 2nd and 3rd toes as well. Given his previous history of below- knee amputation for peripheral disease and ulceration on the left, his multiple atherosclerotic risk factors and his poorly palpable pedal pulses, he was brought in for angiography for further evaluation and  potential treatment. He had a percutaneous angioplasty of the right popliteal artery and the right anterior tibial artery and dorsalis pedis artery. He also had angioplasty of the right peroneal artery and the proximal and mid right posterior tibial artery. On 11/07/2014 he's had a duplex of the right lower extremity which showed the ABI was 0.9 and had a multiphasic waveform. Impression was of mild peripheral vascular disease predominantly small vessel disease right ankle. 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 11/22/2014 -- he has been off cigarettes completely since Saturday and is on a nicotine patch and have commended him about this. [name], Joel DEMEO. ([id number]) Objective Constitutional Pulse regular. Respirations normal and unlabored. Afebrile. Vitals Time Taken: 8:13 AM, Height: 73 in, Weight: 183 lbs, BMI: 24.1, Temperature: 98.3 F, Pulse: 71 bpm, Respiratory Rate: 18 breaths/min, Blood Pressure: 130/78 mmHg. Eyes Nonicteric. Reactive to light. Ears, Nose, Mouth, and  Throat Lips, teeth, and gums WNL.[name] Moist mucosa without lesions . Neck supple and nontender. No palpable supraclavicular or cervical adenopathy. Normal sized without goiter. Respiratory WNL. No retractions.. Cardiovascular Pedal Pulses WNL. No clubbing, cyanosis or edema. Psychiatric Judgement and insight Intact.. No evidence of depression, anxiety, or agitation.. Integumentary (Hair, Skin) scattered wounds on the right forefoot and in between his toes and most of them are fairly dry but have severe tenderness on touch.. No crepitus or fluctuance. No peri-wound warmth or erythema. No masses.. Wound #1 status is Open. Original cause of wound was Gradually Appeared. The wound is located on the Right,Dorsal Metatarsal head first. The wound measures 1.8cm length x 1cm width x 0.1cm depth; 1.414cm^2 area and 0.141cm^3 volume. The wound is limited to skin breakdown. There is a small amount of serous drainage noted. The wound margin is distinct with the outline attached to the wound base. There is no granulation within the wound bed. There is a large (67-100%) amount of necrotic tissue within the wound bed including Eschar and Adherent Slough. The periwound skin appearance exhibited: Dry/Scaly. The periwound skin appearance did not exhibit: Callus, Crepitus, Excoriation, Fluctuance, Friable, Induration, Localized Edema, Rash, Scarring, Maceration, Moist, Atrophie Blanche, Cyanosis, Ecchymosis, Hemosiderin Staining, Mottled, Pallor, Rubor, Erythema. Wound #2 status is Open. Original cause of wound was Gradually Appeared. The wound is located on the Right,Dorsal Toe Second. The wound measures 2.8cm length x 1.5cm width x 0.1cm depth; 3.299cm^2 area and 0.33cm^3 volume. The wound is limited to skin breakdown. There is a small amount of serous drainage noted. The wound margin is distinct with the outline attached to the wound base. There is no [name], [name] ([id number]) granulation within the  wound bed. There is a large (67-100%) amount of necrotic tissue within the wound bed including Eschar. The periwound skin appearance did not exhibit: Callus, Crepitus, Excoriation, Fluctuance, Friable, Induration, Localized Edema, Rash, Scarring, Dry/Scaly, Maceration, Moist, Atrophie Blanche, Cyanosis, Ecchymosis, Hemosiderin Staining, Mottled, Pallor, Rubor, Erythema. Wound #3 status is Open. Original cause of wound was Gradually Appeared. The wound is located on the Right,Dorsal Toe Third. The wound measures 3cm length x 2cm width x 0.2cm depth; 4.712cm^2 area and 0.942cm^3 volume. The wound is limited to skin breakdown. There is a small amount of serosanguineous drainage noted. The wound margin is distinct with the outline attached to the wound base. There is no granulation within the wound bed. There is a large (67-100%) amount of necrotic tissue within the wound bed including Eschar and Adherent Slough. The periwound skin  appearance did not exhibit: Callus, Crepitus, Excoriation, Fluctuance, Friable, Induration, Localized Edema, Rash, Scarring, Dry/Scaly, Maceration, Moist, Atrophie Blanche, Cyanosis, Ecchymosis, Hemosiderin Staining, Mottled, Pallor, Rubor, Erythema. Wound #4 status is Open. Original cause of wound was Gradually Appeared. The wound is located on the Right,Dorsal Toe Fourth. The wound measures 1.3cm length x 0.7cm width x 0.1cm depth; 0.715cm^2 area and 0.071cm^3 volume. The wound is limited to skin breakdown. There is a small amount of serosanguineous drainage noted. The wound margin is distinct with the outline attached to the wound base. There is no granulation within the wound bed. There is a large (67-100%) amount of necrotic tissue within the wound bed including Eschar. The periwound skin appearance did not exhibit: Callus, Crepitus, Excoriation, Fluctuance, Friable, Induration, Localized Edema, Rash, Scarring, Dry/Scaly, Maceration, Moist, Atrophie Blanche, Cyanosis,  Ecchymosis, Hemosiderin Staining, Mottled, Pallor, Rubor, Erythema. Wound #5 status is Open. Original cause of wound was Gradually Appeared. The wound is located on the Right,Dorsal Metatarsal head fifth. The wound measures 1.8cm length x 1.5cm width x 0.1cm depth; 2.121cm^2 area and 0.212cm^3 volume. The wound is limited to skin breakdown. There is a small amount of serosanguineous drainage noted. The wound margin is distinct with the outline attached to the wound base. There is no granulation within the wound bed. There is a large (67-100%) amount of necrotic tissue within the wound bed including Eschar and Adherent Slough. The periwound skin appearance did not exhibit: Callus, Crepitus, Excoriation, Fluctuance, Friable, Induration, Localized Edema, Rash, Scarring, Dry/Scaly, Maceration, Moist, Atrophie Blanche, Cyanosis, Ecchymosis, Hemosiderin Staining, Mottled, Pallor, Rubor, Erythema. Assessment Active Problems ICD-10 E11.621 - Type 2 diabetes mellitus with foot ulcer I70.235 - Atherosclerosis of native arteries of right leg with ulceration of other part of foot N18.6 - End stage renal disease F17.219 - Nicotine dependence, cigarettes, with unspecified nicotine-induced disorders Z89.512 - Acquired absence of left leg below knee Whitham, Carole N. ([id number]) The patient is being very compliant and has been off loading his leg as much as possible. Once the wounds are completely dry we will switch over to Betadine paint but at the present time we will use silver alginate over the wounds and between his toes. I have commended him for giving up smoking and have urged him to continue staying off cigarettes. He will come back and see me next week. Plan Wound Cleansing: Wound #1 Right,Dorsal Metatarsal head first: Clean wound with Normal Saline. May Shower, gently pat wound dry prior to applying new dressing. - shower prior to wound care, if no shower prior to wound care then do not shower  and cleanse wounds with NS prior to wound care Wound #2 Right,Dorsal Toe Second: Clean wound with Normal Saline. May Shower, gently pat wound dry prior to applying new dressing. - shower prior to wound care, if no shower prior to wound care then do not shower and cleanse wounds with NS prior to wound care Wound #3 Right,Dorsal Toe Third: Clean wound with Normal Saline. May Shower, gently pat wound dry prior to applying new dressing. - shower prior to wound care, if no shower prior to wound care then do not shower and cleanse wounds with NS prior to wound care Wound #4 Right,Dorsal Toe Fourth: Clean wound with Normal Saline. May Shower, gently pat wound dry prior to applying new dressing. - shower prior to wound care, if no shower prior to wound care then do not shower and cleanse wounds with NS prior to wound care Wound #5 Right,Dorsal Metatarsal head fifth: Clean  wound with Normal Saline. May Shower, gently pat wound dry prior to applying new dressing. - shower prior to wound care, if no shower prior to wound care then do not shower and cleanse wounds with NS prior to wound care Anesthetic: Wound #1 Right,Dorsal Metatarsal head first: Topical Lidocaine 4% cream applied to wound bed prior to debridement Wound #2 Right,Dorsal Toe Second: Topical Lidocaine 4% cream applied to wound bed prior to debridement Wound #3 Right,Dorsal Toe Third: Topical Lidocaine 4% cream applied to wound bed prior to debridement Wound #4 Right,Dorsal Toe Fourth: Topical Lidocaine 4% cream applied to wound bed prior to debridement Wound #5 Right,Dorsal Metatarsal head fifth: Topical Lidocaine 4% cream applied to wound bed prior to debridement Primary Wound Dressing: Wound #1 Right,Dorsal Metatarsal head first: Aquacel Ag - or calcium alginate with silver equivalent Wound #2 Right,Dorsal Toe Second: Aquacel Ag - or calcium alginate with silver equivalent Wound #3 Right,Dorsal Toe Third: Aquacel Ag - or  calcium alginate with silver equivalent Huneke, Librado N. ([id number]) Wound #4 Right,Dorsal Toe Fourth: Aquacel Ag - or calcium alginate with silver equivalent Wound #5 Right,Dorsal Metatarsal head fifth: Aquacel Ag - or calcium alginate with silver equivalent Secondary Dressing: Wound #1 Right,Dorsal Metatarsal head first: Conform/Kerlix Wound #2 Right,Dorsal Toe Second: Conform/Kerlix Wound #3 Right,Dorsal Toe Third: Conform/Kerlix Wound #4 Right,Dorsal Toe Fourth: Conform/Kerlix Wound #5 Right,Dorsal Metatarsal head fifth: Conform/Kerlix Dressing Change Frequency: Wound #1 Right,Dorsal Metatarsal head first: Change Dressing Monday, Wednesday, Friday Wound #2 Right,Dorsal Toe Second: Change Dressing Monday, Wednesday, Friday Wound #3 Right,Dorsal Toe Third: Change Dressing Monday, Wednesday, Friday Wound #4 Right,Dorsal Toe Fourth: Change Dressing Monday, Wednesday, Friday Wound #5 Right,Dorsal Metatarsal head fifth: Change Dressing Monday, Wednesday, Friday Follow-up Appointments: Wound #1 Right,Dorsal Metatarsal head first: Return Appointment in 1 week. Wound #2 Right,Dorsal Toe Second: Return Appointment in 1 week. Wound #3 Right,Dorsal Toe Third: Return Appointment in 1 week. Wound #4 Right,Dorsal Toe Fourth: Return Appointment in 1 week. Wound #5 Right,Dorsal Metatarsal head fifth: Return Appointment in 1 week. Additional Orders / Instructions: Wound #1 Right,Dorsal Metatarsal head first: Stop Smoking - please provide patient with nicotine patches Wound #2 Right,Dorsal Toe Second: Stop Smoking - please provide patient with nicotine patches Wound #3 Right,Dorsal Toe Third: Stop Smoking - please provide patient with nicotine patches Wound #4 Right,Dorsal Toe Fourth: Stop Smoking - please provide patient with nicotine patches Wound #5 Right,Dorsal Metatarsal head fifth: Stop Smoking - please provide patient with nicotine patches Pandey, Zephaniah N. ([id number]) The  patient is being very compliant and has been off loading his leg as much as possible. Once the wounds are completely dry we will switch over to Betadine paint but at the present time we will use silver alginate over the wounds and between his toes. I have commended him for giving up smoking and have urged him to continue staying off cigarettes. He will come back and see me next week. Electronic Signature(s) Signed: 11/22/2014 12:30:49 PM By: Christin Fudge MD, FACS Entered By: Christin Fudge on 11/22/2014 09:07:16 Gair, [name] ([id number]) -------------------------------------------------------------------------------- SuperBill Details Patient Name: Rode, [name]. Date of Service: 11/22/2014 Medical Record Number: [id number] Patient Account Number: [id number] Date of Birth/Sex: Oct 17, 1955 (59 y.o. Male) Treating RN: Primary Care Physician: Tomasa Hose Other Clinician: Referring Physician: Tomasa Hose Treating Physician/Extender: Frann Rider in Treatment: 0 Diagnosis Coding ICD-10 Codes Code Description E11.621 Type 2 diabetes mellitus with foot ulcer I70.235 Atherosclerosis of native arteries of right leg with ulceration of other part of foot  N18.6 End stage renal disease F17.219 Nicotine dependence, cigarettes, with unspecified nicotine-induced disorders Z89.512 Acquired absence of left leg below knee Facility Procedures CPT4 Code: [id number] Description: 99214 - WOUND CARE VISIT-LEV 4 EST PT Modifier: Quantity: 1 Physician Procedures CPT4: Description Modifier Quantity Code [id number] - WC PHYS LEVEL 3 - EST PT 1 ICD-10 Description Diagnosis E11.621 Type 2 diabetes mellitus with foot ulcer I70.235 Atherosclerosis of native arteries of right leg with ulceration of other part of  foot Electronic Signature(s) Signed: 11/22/2014 12:30:49 PM By: Christin Fudge MD, FACS Entered By: Christin Fudge on 11/22/2014 09:07:35

## 2014-11-29 ENCOUNTER — Ambulatory Visit: Payer: Medicare Other | Admitting: Surgery

## 2014-12-01 ENCOUNTER — Encounter: Payer: Medicare Other | Attending: Surgery | Admitting: Surgery

## 2014-12-01 DIAGNOSIS — E11621 Type 2 diabetes mellitus with foot ulcer: Secondary | ICD-10-CM | POA: Insufficient documentation

## 2014-12-01 DIAGNOSIS — F17219 Nicotine dependence, cigarettes, with unspecified nicotine-induced disorders: Secondary | ICD-10-CM | POA: Insufficient documentation

## 2014-12-01 DIAGNOSIS — N186 End stage renal disease: Secondary | ICD-10-CM | POA: Diagnosis not present

## 2014-12-01 DIAGNOSIS — I70235 Atherosclerosis of native arteries of right leg with ulceration of other part of foot: Secondary | ICD-10-CM | POA: Diagnosis not present

## 2014-12-01 DIAGNOSIS — L97511 Non-pressure chronic ulcer of other part of right foot limited to breakdown of skin: Secondary | ICD-10-CM | POA: Diagnosis present

## 2014-12-01 DIAGNOSIS — Z992 Dependence on renal dialysis: Secondary | ICD-10-CM | POA: Diagnosis not present

## 2014-12-01 DIAGNOSIS — Z89512 Acquired absence of left leg below knee: Secondary | ICD-10-CM | POA: Insufficient documentation

## 2014-12-02 NOTE — Progress Notes (Signed)
[name] [name] FRANCIONE ([id number]) Visit Report for 12/01/2014 Chief Complaint Document Details Patient Name: [name] [name]. Date of Service: 12/01/2014 1:00 PM Medical Record Number: [id number] Patient Account Number: [id number] Date of Birth/Sex: March 01, 1956 (59 y.o. Male) Treating RN: Primary Care Physician: [name] [name] Other Clinician: Referring Physician: Tomasa [name] Treating Physician/Extender: [name] [name] in Treatment: 2 Information Obtained from: Patient Chief Complaint Patient presents to the wound care center today with an open arterial ulcer, on his right foot and also severe pain for several weeks. Electronic Signature(s) Signed: 12/01/2014 1:45:33 PM By: [name] Fudge MD, FACS Entered By: [name] [name] on 12/01/2014 13:45:33 [name] [name] ([id number]) -------------------------------------------------------------------------------- HPI Details Patient Name: [name] [name] N. Date of Service: 12/01/2014 1:00 PM Medical Record Number: [id number] Patient Account Number: [id number] Date of Birth/Sex: 03/10/1956 (59 y.o. Male) Treating RN: Primary Care Physician: [name] [name] Other Clinician: Referring Physician: Tomasa [name] Treating Physician/Extender: [name] [name] in Treatment: 2 History of Present Illness Location: right foot pain with ulcers on his forefoot Quality: Patient reports experiencing burning to affected area(s). Severity: Patient states wound are getting worse. Duration: Patient has had the wound for > 3 months prior to seeking treatment at the wound center Timing: Pain in wound is constant (hurts all the time) Context: The wound appeared gradually over time Modifying Factors: Consults to this date include: vascular consult for angioplasty Associated Signs and Symptoms: Patient reports having: difficulty in using his prosthesis and is wheelchair-bound. HPI Description: Seen by Dr. Leotis Pain on 08/29/2014 with ulceration on 2 toes on the right  foot and dark discoloration worrisome for gangrenous changes on his 2nd and 3rd toes as well.o Given his previous history of below-knee amputation for peripheral disease and ulceration on the left, his multiple atherosclerotic risk factors and his poorly palpable pedal pulses, he was brought in for angiography for further evaluation and potential treatment. He had a percutaneous angioplasty of the right popliteal artery and the right anterior tibial artery and dorsalis pedis artery. He also had angioplasty of the right peroneal artery and the proximal and mid right posterior tibial artery. On 11/07/2014 he's had a duplex of the right lower extremity which showed the ABI was 0.9 and had a multiphasic waveform. Impression was of mild peripheral vascular disease predominantly small vessel disease right ankle. 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 11/22/2014 -- he has been off cigarettes completely since Saturday and is on a nicotine patch and have commended him about this. Electronic Signature(s) Signed: 12/01/2014 1:45:39 PM By: [name] Fudge MD, FACS Entered By: [name] [name] on 12/01/2014 13:45:39 [name] [name] ([id number]) -------------------------------------------------------------------------------- Physical Exam Details Patient Name: [name] [name] N. Date of Service: 12/01/2014 1:00 PM Medical Record Number: [id number] Patient Account Number: [id number] Date of Birth/Sex: 12-Jun-1955 (59 y.o. Male) Treating RN: Primary Care Physician: [name] [name] Other Clinician: Referring Physician: Tomasa [name] Treating Physician/Extender: [name] [name] in Treatment: 2 Constitutional . Pulse regular. Respirations normal and unlabored. Afebrile. . Eyes Nonicteric. Reactive to light. Ears, Nose, Mouth, and Throat Lips, teeth, and gums WNL.[name] Moist mucosa without lesions . Neck supple and nontender. No  palpable supraclavicular or cervical adenopathy. Normal sized without goiter. Respiratory WNL. No retractions.. Cardiovascular Pedal Pulses WNL. No clubbing, cyanosis or edema. Chest Breasts symmetical and no nipple discharge.. Breast tissue WNL, no masses, lumps, or tenderness.. Musculoskeletal Adexa without tenderness or enlargement.. Digits and nails w/o clubbing, cyanosis, infection, petechiae, ischemia, or inflammatory conditions.. Integumentary (  Hair, Skin) No suspicious lesions. No crepitus or fluctuance. No peri-wound warmth or erythema. No masses.[name] Psychiatric Judgement and insight Intact.. No evidence of depression, anxiety, or agitation.. Notes Most of the ulcerations on his right foot have dried out but some between the second and third toe are still got a little moisture and this has been debrided bluntly. Electronic Signature(s) Signed: 12/01/2014 1:46:55 PM By: [name] Fudge MD, FACS Entered By: [name] [name] on 12/01/2014 13:46:55 [name] [name] ([id number]) -------------------------------------------------------------------------------- Physician Orders Details Patient Name: [name] [name] N. Date of Service: 12/01/2014 1:00 PM Medical Record Number: [id number] Patient Account Number: [id number] Date of Birth/Sex: 04-26-1956 (59 y.o. Male) Treating RN: Montey Hora Primary Care Physician: [name] [name] Other Clinician: Referring Physician: Tomasa [name] Treating Physician/Extender: [name] [name] in Treatment: 2 Verbal / Phone Orders: Yes Clinician: Montey Hora Read Back and Verified: Yes Diagnosis Coding Wound Cleansing Wound #1 Right,Dorsal Metatarsal head first o Clean wound with Normal Saline. o May Shower, gently pat wound dry prior to applying new dressing. - shower prior to wound care, if no shower prior to wound care then do not shower and cleanse wounds with NS prior to wound care Wound #2 Right,Dorsal Toe Second o Clean wound with  Normal Saline. o May Shower, gently pat wound dry prior to applying new dressing. - shower prior to wound care, if no shower prior to wound care then do not shower and cleanse wounds with NS prior to wound care Wound #3 Right,Dorsal Toe Third o Clean wound with Normal Saline. o May Shower, gently pat wound dry prior to applying new dressing. - shower prior to wound care, if no shower prior to wound care then do not shower and cleanse wounds with NS prior to wound care Wound #4 Right,Dorsal Toe Fourth o Clean wound with Normal Saline. o May Shower, gently pat wound dry prior to applying new dressing. - shower prior to wound care, if no shower prior to wound care then do not shower and cleanse wounds with NS prior to wound care Wound #5 Right,Dorsal Metatarsal head fifth o Clean wound with Normal Saline. o May Shower, gently pat wound dry prior to applying new dressing. - shower prior to wound care, if no shower prior to wound care then do not shower and cleanse wounds with NS prior to wound care Anesthetic Wound #1 Right,Dorsal Metatarsal head first o Topical Lidocaine 4% cream applied to wound bed prior to debridement Wound #2 Right,Dorsal Toe Second o Topical Lidocaine 4% cream applied to wound bed prior to debridement Wound #3 Right,Dorsal Toe Third o Topical Lidocaine 4% cream applied to wound bed prior to debridement Wound #4 Right,Dorsal Toe Fourth o Topical Lidocaine 4% cream applied to wound bed prior to debridement Vivian, Clarke N. ([id number]) Wound #5 Right,Dorsal Metatarsal head fifth o Topical Lidocaine 4% cream applied to wound bed prior to debridement Primary Wound Dressing Wound #1 Right,Dorsal Metatarsal head first o Aquacel Ag - or calcium alginate with silver equivalent Wound #2 Right,Dorsal Toe Second o Aquacel Ag - or calcium alginate with silver equivalent Wound #3 Right,Dorsal Toe Third o Aquacel Ag - or calcium alginate with silver  equivalent Wound #4 Right,Dorsal Toe Fourth o Aquacel Ag - or calcium alginate with silver equivalent Wound #5 Right,Dorsal Metatarsal head fifth o Aquacel Ag - or calcium alginate with silver equivalent Secondary Dressing Wound #1 Right,Dorsal Metatarsal head first o Conform/Kerlix Wound #2 Right,Dorsal Toe Second o Conform/Kerlix Wound #3 Right,Dorsal Toe Third o Conform/Kerlix Wound #4 Right,Dorsal  Toe Fourth o Conform/Kerlix Wound #5 Right,Dorsal Metatarsal head fifth o Conform/Kerlix Dressing Change Frequency Wound #1 Right,Dorsal Metatarsal head first o Change Dressing Monday, Wednesday, Friday Wound #2 Right,Dorsal Toe Second o Change Dressing Monday, Wednesday, Friday Wound #3 Right,Dorsal Toe Third o Change Dressing Monday, Wednesday, Friday Wound #4 Right,Dorsal Toe Fourth o Change Dressing Monday, Wednesday, Friday [place] ([id number]) Wound #5 Right,Dorsal Metatarsal head fifth o Change Dressing Monday, Wednesday, Friday Follow-up Appointments Wound #1 Right,Dorsal Metatarsal head first o Return Appointment in 1 week. Wound #2 Right,Dorsal Toe Second o Return Appointment in 1 week. Wound #3 Right,Dorsal Toe Third o Return Appointment in 1 week. Wound #4 Right,Dorsal Toe Fourth o Return Appointment in 1 week. Wound #5 Right,Dorsal Metatarsal head fifth o Return Appointment in 1 week. Additional Orders / Instructions Wound #1 Right,Dorsal Metatarsal head first o Stop Smoking - please provide patient with nicotine patches Wound #2 Right,Dorsal Toe Second o Stop Smoking - please provide patient with nicotine patches Wound #3 Right,Dorsal Toe Third o Stop Smoking - please provide patient with nicotine patches Wound #4 Right,Dorsal Toe Fourth o Stop Smoking - please provide patient with nicotine patches Wound #5 Right,Dorsal Metatarsal head fifth o Stop Smoking - please provide patient with nicotine  patches Notes [facility] to obtain right foot xray per MD order on 11/17/2014 Electronic Signature(s) Signed: 12/01/2014 4:41:07 PM By: [name] Fudge MD, FACS Signed: 12/01/2014 5:13:09 PM By: Montey Hora Entered By: Montey Hora on 12/01/2014 13:41:01 [place], Angelyn [name] ([id number]) -------------------------------------------------------------------------------- Problem List Details Patient Name: [name] [name] N. Date of Service: 12/01/2014 1:00 PM Medical Record Number: [id number] Patient Account Number: [id number] Date of Birth/Sex: February 01, 1956 (59 y.o. 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 Date of Service: 12/01/2014 1:00 PM Medical Record Number: [id number] Patient Account Number: [id number] Date of Birth/Sex: 16-Jan-1956 (59 y.o. Male) Treating RN: Primary Care Physician: [name] [name] Other Clinician: Referring Physician: Tomasa [name] Treating Physician/Extender: [name] [name] in Treatment: 2 Subjective Chief Complaint Information obtained from Patient Patient  presents to the wound care center today with an open arterial ulcer, on his right foot and also severe pain for several weeks. History of Present Illness (HPI) The following HPI elements were documented for the patient's wound: Location: right foot pain with ulcers on his forefoot Quality: Patient reports experiencing burning to affected area(s). Severity: Patient states wound are getting worse. Duration: Patient has had the wound for > 3 months prior to seeking treatment at the wound center Timing: Pain in wound is constant (hurts all the time) Context: The wound appeared gradually over time Modifying Factors: Consults to this date include: vascular consult for angioplasty Associated Signs and Symptoms: Patient reports having: difficulty in using his prosthesis and is wheelchair- bound. Seen by Dr. Leotis Pain on 08/29/2014 with ulceration on 2 toes on the right foot  and dark discoloration worrisome for gangrenous changes on his 2nd and 3rd toes as well. Given his previous history of below- knee amputation for peripheral disease and ulceration on the left, his multiple atherosclerotic risk factors and his poorly palpable pedal pulses, he was brought in for angiography for further evaluation and potential treatment. He had a percutaneous angioplasty of the right popliteal artery and the right anterior tibial artery and dorsalis pedis artery. He also had angioplasty of the right peroneal artery and the proximal and mid right posterior tibial artery. On 11/07/2014 he's had a duplex of the right lower extremity which showed the ABI was 0.9 and had a multiphasic waveform. Impression was of mild peripheral vascular disease predominantly small vessel disease right ankle. 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 11/22/2014 -- he has been off cigarettes completely since Saturday and is on a nicotine patch and  have commended him about this. [name] [name] COWENS. ([id number]) Objective Constitutional Pulse regular. Respirations normal and unlabored. Afebrile. Vitals Time Taken: 1:15 PM, Height: 73 in, Weight: 183 lbs, BMI: 24.1, Temperature: 98.4 F, Pulse: 70 bpm, Respiratory Rate: 18 breaths/min, Blood Pressure: 122/77 mmHg. Eyes Nonicteric. Reactive to light. Ears, Nose, Mouth, and Throat Lips, teeth, and gums WNL.[name] Moist mucosa without lesions . Neck supple and nontender. No palpable supraclavicular or cervical adenopathy. Normal sized without goiter. Respiratory WNL. No retractions.. Cardiovascular Pedal Pulses WNL. No clubbing, cyanosis or edema. Chest Breasts symmetical and no nipple discharge.. Breast tissue WNL, no masses, lumps, or tenderness.. Musculoskeletal Adexa without tenderness or enlargement.. Digits and nails w/o clubbing, cyanosis, infection, petechiae, ischemia, or inflammatory conditions.[name] Psychiatric Judgement and insight Intact.. No evidence of depression, anxiety, or agitation.. General Notes: Most of the ulcerations on his right foot have dried out but some between the second and third toe are still got a little moisture and this has been debrided bluntly. Integumentary (Hair, Skin) No suspicious lesions. No crepitus or fluctuance. No peri-wound warmth or erythema. No masses.. Wound #1 status is Open. Original cause of wound was Gradually Appeared. The wound is located on the Right,Dorsal Metatarsal head first. The wound measures 1.5cm length x 0.7cm width x 0.1cm depth; 0.825cm^2 area and 0.082cm^3 volume. The wound is limited to skin breakdown. There is no tunneling or undermining noted. There is a small amount of serous drainage noted. The wound margin is distinct with the outline attached to the wound base. There is no granulation within the wound bed. There is a large (67- 100%) amount of necrotic tissue within the wound bed including Eschar and Adherent Slough.  The [name] [name] SELLARS. ([id number]) periwound skin appearance exhibited: Dry/Scaly. The periwound skin appearance did not exhibit: Callus, Crepitus, Excoriation, Fluctuance, Friable, Induration, Localized Edema, Rash, Scarring, Maceration, Moist, Atrophie Blanche, Cyanosis, Ecchymosis, Hemosiderin Staining, Mottled, Pallor, Rubor, Erythema. Periwound temperature was noted as No Abnormality. The periwound has tenderness on palpation. Wound #2 status is Open. Original cause of wound was Gradually Appeared. The wound is located on the Right,Dorsal Toe Second. The wound measures 3.2cm length x 1.7cm width x 0.1cm depth; 4.273cm^2 area and 0.427cm^3 volume. The wound is limited to skin breakdown. There is no tunneling or undermining noted. There is a small amount of serous drainage noted. The wound margin is distinct with the outline attached to the wound base. There is no granulation within the wound bed. There is a large (67-100%) amount of necrotic tissue within the wound bed including Eschar.  The periwound skin appearance did not exhibit: Callus, Crepitus, Excoriation, Fluctuance, Friable, Induration, Localized Edema, Rash, Scarring, Dry/Scaly, Maceration, Moist, Atrophie Blanche, Cyanosis, Ecchymosis, Hemosiderin Staining, Mottled, Pallor, Rubor, Erythema. Periwound temperature was noted as No Abnormality. The periwound has tenderness on palpation. Wound #3 status is Open. Original cause of wound was Gradually Appeared. The wound is located on the Right,Dorsal Toe Third. The wound measures 3cm length x 1.4cm width x 0.2cm depth; 3.299cm^2 area and 0.66cm^3 volume. The wound is limited to skin breakdown. There is no tunneling or undermining noted. There is a small amount of serosanguineous drainage noted. The wound margin is distinct with the outline attached to the wound base. There is no granulation within the wound bed. There is a large (67-100%) amount of necrotic tissue within the wound bed  including Eschar and Adherent Slough. The periwound skin appearance did not exhibit: Callus, Crepitus, Excoriation, Fluctuance, Friable, Induration, Localized Edema, Rash, Scarring, Dry/Scaly, Maceration, Moist, Atrophie Blanche, Cyanosis, Ecchymosis, Hemosiderin Staining, Mottled, Pallor, Rubor, Erythema. Periwound temperature was noted as No Abnormality. The periwound has tenderness on palpation. Wound #4 status is Open. Original cause of wound was Gradually Appeared. The wound is located on the Right,Dorsal Toe Fourth. The wound measures 1.4cm length x 0.6cm width x 0.1cm depth; 0.66cm^2 area and 0.066cm^3 volume. The wound is limited to skin breakdown. There is no tunneling or undermining noted. There is a small amount of serosanguineous drainage noted. The wound margin is distinct with the outline attached to the wound base. There is no granulation within the wound bed. There is a large (67- 100%) amount of necrotic tissue within the wound bed including Eschar. The periwound skin appearance did not exhibit: Callus, Crepitus, Excoriation, Fluctuance, Friable, Induration, Localized Edema, Rash, Scarring, Dry/Scaly, Maceration, Moist, Atrophie Blanche, Cyanosis, Ecchymosis, Hemosiderin Staining, Mottled, Pallor, Rubor, Erythema. Periwound temperature was noted as No Abnormality. The periwound has tenderness on palpation. Wound #5 status is Open. Original cause of wound was Gradually Appeared. The wound is located on the Right,Dorsal Metatarsal head fifth. The wound measures 1.8cm length x 1.5cm width x 0.1cm depth; 2.121cm^2 area and 0.212cm^3 volume. The wound is limited to skin breakdown. There is no tunneling or undermining noted. There is a small amount of serosanguineous drainage noted. The wound margin is distinct with the outline attached to the wound base. There is no granulation within the wound bed. There is a large (67-100%) amount of necrotic tissue within the wound bed including  Eschar and Adherent Slough. The periwound skin appearance did not exhibit: Callus, Crepitus, Excoriation, Fluctuance, Friable, Induration, Localized Edema, Rash, Scarring, Dry/Scaly, Maceration, Moist, Atrophie Blanche, Cyanosis, Ecchymosis, Hemosiderin Staining, Mottled, Pallor, Rubor, Erythema. Periwound temperature was noted as No Abnormality. The periwound has tenderness on palpation. [name] [name] RUGAMA ([id number]) Assessment Active Problems ICD-10 E11.621 - Type 2 diabetes mellitus with foot ulcer I70.235 - Atherosclerosis of native arteries of right leg with ulceration of other part of foot N18.6 - End stage renal disease F17.219 - Nicotine dependence, cigarettes, with unspecified nicotine-induced disorders Z89.512 - Acquired absence of left leg below knee We will continue to use silver alginate again this week especially between his toes and then move over to Betadine paint probably next week. He continues to stay off cigarettes and I have commended him. Plan Wound Cleansing: Wound #1 Right,Dorsal Metatarsal head first: Clean wound with Normal Saline. May Shower, gently pat wound dry prior to applying new dressing. - shower prior to wound care, if no shower prior to wound care  then do not shower and cleanse wounds with NS prior to wound care Wound #2 Right,Dorsal Toe Second: Clean wound with Normal Saline. May Shower, gently pat wound dry prior to applying new dressing. - shower prior to wound care, if no shower prior to wound care then do not shower and cleanse wounds with NS prior to wound care Wound #3 Right,Dorsal Toe Third: Clean wound with Normal Saline. May Shower, gently pat wound dry prior to applying new dressing. - shower prior to wound care, if no shower prior to wound care then do not shower and cleanse wounds with NS prior to wound care Wound #4 Right,Dorsal Toe Fourth: Clean wound with Normal Saline. May Shower, gently pat wound dry prior to applying new dressing.  - shower prior to wound care, if no shower prior to wound care then do not shower and cleanse wounds with NS prior to wound care Wound #5 Right,Dorsal Metatarsal head fifth: Clean wound with Normal Saline. May Shower, gently pat wound dry prior to applying new dressing. - shower prior to wound care, if no shower prior to wound care then do not shower and cleanse wounds with NS prior to wound care Anesthetic: Wound #1 Right,Dorsal Metatarsal head first: Topical Lidocaine 4% cream applied to wound bed prior to debridement Wound #2 Right,Dorsal Toe Second: Topical Lidocaine 4% cream applied to wound bed prior to debridement [name] [name] N. ([id number]) Wound #3 Right,Dorsal Toe Third: Topical Lidocaine 4% cream applied to wound bed prior to debridement Wound #4 Right,Dorsal Toe Fourth: Topical Lidocaine 4% cream applied to wound bed prior to debridement Wound #5 Right,Dorsal Metatarsal head fifth: Topical Lidocaine 4% cream applied to wound bed prior to debridement Primary Wound Dressing: Wound #1 Right,Dorsal Metatarsal head first: Aquacel Ag - or calcium alginate with silver equivalent Wound #2 Right,Dorsal Toe Second: Aquacel Ag - or calcium alginate with silver equivalent Wound #3 Right,Dorsal Toe Third: Aquacel Ag - or calcium alginate with silver equivalent Wound #4 Right,Dorsal Toe Fourth: Aquacel Ag - or calcium alginate with silver equivalent Wound #5 Right,Dorsal Metatarsal head fifth: Aquacel Ag - or calcium alginate with silver equivalent Secondary Dressing: Wound #1 Right,Dorsal Metatarsal head first: Conform/Kerlix Wound #2 Right,Dorsal Toe Second: Conform/Kerlix Wound #3 Right,Dorsal Toe Third: Conform/Kerlix Wound #4 Right,Dorsal Toe Fourth: Conform/Kerlix Wound #5 Right,Dorsal Metatarsal head fifth: Conform/Kerlix Dressing Change Frequency: Wound #1 Right,Dorsal Metatarsal head first: Change Dressing Monday, Wednesday, Friday Wound #2 Right,Dorsal Toe  Second: Change Dressing Monday, Wednesday, Friday Wound #3 Right,Dorsal Toe Third: Change Dressing Monday, Wednesday, Friday Wound #4 Right,Dorsal Toe Fourth: Change Dressing Monday, Wednesday, Friday Wound #5 Right,Dorsal Metatarsal head fifth: Change Dressing Monday, Wednesday, Friday Follow-up Appointments: Wound #1 Right,Dorsal Metatarsal head first: Return Appointment in 1 week. Wound #2 Right,Dorsal Toe Second: Return Appointment in 1 week. Wound #3 Right,Dorsal Toe Third: Return Appointment in 1 week. Wound #4 Right,Dorsal Toe Fourth: Return Appointment in 1 week. Wound #5 Right,Dorsal Metatarsal head fifth: Return Appointment in 1 week. Additional Orders / Instructions: [name] [name] BASTIEN ([id number]) Wound #1 Right,Dorsal Metatarsal head first: Stop Smoking - please provide patient with nicotine patches Wound #2 Right,Dorsal Toe Second: Stop Smoking - please provide patient with nicotine patches Wound #3 Right,Dorsal Toe Third: Stop Smoking - please provide patient with nicotine patches Wound #4 Right,Dorsal Toe Fourth: Stop Smoking - please provide patient with nicotine patches Wound #5 Right,Dorsal Metatarsal head fifth: Stop Smoking - please provide patient with nicotine patches General Notes: [facility] to obtain right foot xray per  MD order on 11/17/2014 We will continue to use silver alginate again this week especially between his toes and then move over to Betadine paint probably next week. He continues to stay off cigarettes and I have commended him. Electronic Signature(s) Signed: 12/01/2014 1:47:48 PM By: [name] Fudge MD, FACS Entered By: [name] [name] on 12/01/2014 13:47:48 Knipple, Angelyn [name] ([id number]) -------------------------------------------------------------------------------- SuperBill Details Patient Name: Beckworth, [name] N. Date of Service: 12/01/2014 Medical Record Number: [id number] Patient Account Number: [id number] Date of Birth/Sex: [birth year]  (59 y.o. 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 Facility Procedures CPT4 Code: [id number] Description: [id number] - WOUND CARE VISIT-LEV 5 EST PT Modifier: Quantity: 1 Physician Procedures CPT4: Description Modifier Quantity Code [id number] - WC PHYS LEVEL 3 - EST PT 1 ICD-10 Description Diagnosis E11.621 Type 2 diabetes mellitus with foot ulcer I70.235 Atherosclerosis of native arteries of right leg with ulceration of other part of  foot F17.219 Nicotine dependence, cigarettes, with unspecified nicotine-induced disorders Electronic Signature(s) Signed: 12/01/2014 1:48:15 PM By: [name] Fudge MD, FACS Entered By: [name] [name] on 12/01/2014 13:48:15

## 2014-12-02 NOTE — Progress Notes (Signed)
[name], [name] ([id number]) Visit Report for 12/01/2014 Arrival Information Details Patient Name: Joel, [name]. Date of Service: 12/01/2014 1:00 PM Medical Record Number: [id number] Patient Account Number: [id number] Date of Birth/Sex: 1956/03/27 (59 y.o. Male) Treating RN: Montey Hora Primary Care Physician: Tomasa Hose Other Clinician: Referring Physician: Tomasa Hose Treating Physician/Extender: Frann Rider in Treatment: 2 Visit Information History Since Last Visit Added or deleted any medications: No Patient Arrived: Wheel Chair Any new allergies or adverse reactions: No Arrival Time: 13:15 Had a fall or experienced change in No Accompanied By: self activities of daily living that may affect Transfer Assistance: None risk of falls: Patient Identification Verified: Yes Signs or symptoms of abuse/neglect since last No Secondary Verification Process Yes visito Completed: Hospitalized since last visit: No Patient Has Alerts: Yes Pain Present Now: Yes Patient Alerts: Patient on Blood Thinner asa 325 DMII Electronic Signature(s) Signed: 12/01/2014 5:13:09 PM By: Montey Hora Entered By: Montey Hora on 12/01/2014 13:15:41 [name] ([id number]) -------------------------------------------------------------------------------- Clinic Level of Care Assessment Details Patient Name: [name], [name] Date of Service: 12/01/2014 1:00 PM Medical Record Number: [id number] Patient Account Number: [id number] Date of Birth/Sex: Oct 06, 1955 (59 y.o. Male) Treating RN: Montey Hora Primary Care Physician: Tomasa Hose Other Clinician: Referring Physician: Tomasa Hose Treating Physician/Extender: Frann Rider in Treatment: 2 Clinic Level of Care Assessment Items TOOL 4 Quantity Score []  - Use when only an EandM is performed on FOLLOW-UP visit 0 ASSESSMENTS - Nursing Assessment / Reassessment X - Reassessment of Co-morbidities (includes updates in  patient status) 1 10 X - Reassessment of Adherence to Treatment Plan 1 5 ASSESSMENTS - Wound and Skin Assessment / Reassessment []  - Simple Wound Assessment / Reassessment - one wound 0 X - Complex Wound Assessment / Reassessment - multiple wounds 5 5 []  - Dermatologic / Skin Assessment (not related to wound area) 0 ASSESSMENTS - Focused Assessment []  - Circumferential Edema Measurements - multi extremities 0 []  - Nutritional Assessment / Counseling / Intervention 0 X - Lower Extremity Assessment (monofilament, tuning fork, pulses) 1 5 []  - Peripheral Arterial Disease Assessment (using hand held doppler) 0 ASSESSMENTS - Ostomy and/or Continence Assessment and Care []  - Incontinence Assessment and Management 0 []  - Ostomy Care Assessment and Management (repouching, etc.) 0 PROCESS - Coordination of Care X - Simple Patient / Family Education for ongoing care 1 15 []  - Complex (extensive) Patient / Family Education for ongoing care 0 []  - Staff obtains Programmer, systems, Records, Test Results / Process Orders 0 []  - Staff telephones HHA, Nursing Homes / Clarify orders / etc 0 []  - Routine Transfer to another Facility (non-emergent condition) 0 [name] ([id number]) []  - Routine Hospital Admission (non-emergent condition) 0 []  - New Admissions / Biomedical engineer / Ordering NPWT, Apligraf, etc. 0 []  - Emergency Hospital Admission (emergent condition) 0 X - Simple Discharge Coordination 1 10 []  - Complex (extensive) Discharge Coordination 0 PROCESS - Special Needs []  - Pediatric / Minor Patient Management 0 []  - Isolation Patient Management 0 []  - Hearing / Language / Visual special needs 0 []  - Assessment of Community assistance (transportation, D/C planning, etc.) 0 []  - Additional assistance / Altered mentation 0 []  - Support Surface(s) Assessment (bed, cushion, seat, etc.) 0 INTERVENTIONS - Wound Cleansing / Measurement []  - Simple Wound Cleansing - one wound 0 X - Complex  Wound Cleansing - multiple wounds 5 5 []  - Wound Imaging (photographs - any number of wounds) 0 []  - Wound Tracing (instead  of photographs) 0 []  - Simple Wound Measurement - one wound 0 X - Complex Wound Measurement - multiple wounds 5 5 INTERVENTIONS - Wound Dressings X - Small Wound Dressing one or multiple wounds 5 10 []  - Medium Wound Dressing one or multiple wounds 0 []  - Large Wound Dressing one or multiple wounds 0 []  - Application of Medications - topical 0 []  - Application of Medications - injection 0 INTERVENTIONS - Miscellaneous []  - External ear exam 0 [name], [name] ([id number]) []  - Specimen Collection (cultures, biopsies, blood, body fluids, etc.) 0 []  - Specimen(s) / Culture(s) sent or taken to Lab for analysis 0 []  - Patient Transfer (multiple staff / [name] Lift / Similar devices) 0 []  - Simple Staple / Suture removal (25 or less) 0 []  - Complex Staple / Suture removal (26 or more) 0 []  - Hypo / Hyperglycemic Management (close monitor of Blood Glucose) 0 []  - Ankle / Brachial Index (ABI) - do not check if billed separately 0 X - Vital Signs 1 5 Has the patient been seen at the hospital within the last three years: Yes Total Score: 175 Level Of Care: New/Established - Level 5 Electronic Signature(s) Signed: 12/01/2014 5:13:09 PM By: Montey Hora Entered By: Montey Hora on 12/01/2014 13:41:42 [name], Angelyn [name] ([id number]) -------------------------------------------------------------------------------- Encounter Discharge Information Details Patient Name: [name], [name] Date of Service: 12/01/2014 1:00 PM Medical Record Number: [id number] Patient Account Number: [id number] Date of Birth/Sex: August 18, 1955 (59 y.o. Male) Treating RN: Montey Hora Primary Care Physician: Tomasa Hose Other Clinician: Referring Physician: Tomasa Hose Treating Physician/Extender: Frann Rider in Treatment: 2 Encounter Discharge Information Items Discharge Pain Level:  0 Discharge Condition: Stable Ambulatory Status: Wheelchair Nursing Discharge Destination: Home Transportation: Private Auto Accompanied By: self Schedule Follow-up Appointment: Yes Medication Reconciliation completed No and provided to Patient/Care Sonika Levins: Clinical Summary of Care: Electronic Signature(s) Signed: 12/01/2014 5:13:09 PM By: Montey Hora Entered By: Montey Hora on 12/01/2014 13:42:29 Tax, Angelyn [name] ([id number]) -------------------------------------------------------------------------------- Lower Extremity Assessment Details Patient Name: [name], [name] Date of Service: 12/01/2014 1:00 PM Medical Record Number: [id number] Patient Account Number: [id number] Date of Birth/Sex: [birth year] (59 y.o. Male) Treating RN: Montey Hora Primary Care Physician: Tomasa Hose Other Clinician: Referring Physician: Tomasa Hose Treating Physician/Extender: Frann Rider in Treatment: 2 Vascular Assessment Pulses: Posterior Tibial Dorsalis Pedis Palpable: [Right:Yes] Extremity colors, hair growth, and conditions: Extremity Color: [Right:Normal] Hair Growth on Extremity: [Right:Yes] Temperature of Extremity: [Right:Warm] Capillary Refill: [Right:< 3 seconds] Electronic Signature(s) Signed: 12/01/2014 5:13:09 PM By: Montey Hora Entered By: Montey Hora on 12/01/2014 13:29:01 Ng, Angelyn [name] ([id number]) -------------------------------------------------------------------------------- Multi Wound Chart Details Patient Name: [name], [name] Date of Service: 12/01/2014 1:00 PM Medical Record Number: [id number] Patient Account Number: [id number] Date of Birth/Sex: Aug 07, 1955 (59 y.o. 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] Comorbid History: [1:Anemia, Asthma, Coronary Artery Disease, Coronary Artery Disease, Coronary Artery Disease, Hypertension, Type II Diabetes, End Stage Renal Disease, Neuropathy] [2:Anemia, Asthma, Hypertension, Type II Diabetes, End Stage Renal  Disease, Neuropathy] [3:Anemia,  Asthma, Hypertension, Type II Diabetes, End Stage Renal Disease, Neuropathy] Date Acquired: [1:09/14/2014] [2:09/14/2014] [3:09/14/2014] Weeks of Treatment: [1:2] [2:2] [3:2] Wound Status: [1:Open] [2:Open] [3:Open] Measurements L x W x D 1.5x0.7x0.1 [2:3.2x1.7x0.1] [3:3x1.4x0.2] (cm) Area (cm) : [1:0.825] [2:4.273] [3:3.299] Volume (cm) : [1:0.082] [2:0.427] [3:0.66] % Reduction in Area: [1:-3.00%] [2:-3.60%] [3:6.60%] % Reduction in Volume: -2.50% [2:-3.60%] [3:-87.00%] Classification: [1:Partial Thickness] [2:Partial Thickness] [3:Partial Thickness] HBO Classification: [1:Grade 1] [2:Grade 1] [3:Grade 1] Exudate Amount: [1:Small] [2:Small] [3:Small] Exudate Type: [1:Serous] [2:Serous] [3:Serosanguineous] Exudate Color: [1:amber] [2:amber] [3:red, brown] Wound Margin: [1:Distinct, outline attached Distinct, outline attached Distinct, outline attached] Granulation Amount: [1:None Present (0%)] [2:None Present (0%)] [3:None Present (0%)] Necrotic Amount: [1:Large (67-100%)] [2:Large (67-100%)] [3:Large (67-100%)] Necrotic Tissue: [1:Eschar, Adherent Slough Eschar] [3:Eschar, Adherent Slough] Exposed Structures: Feely, Hoke N. ([id number]) Fascia:  No Fascia: No Fascia: No Fat: No Fat: No Fat: No Tendon: No Tendon: No Tendon: No Muscle: No Muscle: No Muscle: No Joint: No Joint: No Joint: No Bone: No Bone: No Bone: No Limited to Skin Limited to Skin Limited to Skin Breakdown Breakdown Breakdown Epithelialization: None None None Periwound Skin Texture: Edema: No Edema: No Edema: No Excoriation: No Excoriation: No Excoriation: No Induration: No Induration: No Induration: No Callus: No Callus: No Callus: No Crepitus: No Crepitus: No Crepitus: No Fluctuance: No Fluctuance: No Fluctuance: No Friable: No Friable: No Friable: No Rash: No Rash: No Rash: No Scarring: No Scarring: No Scarring: No Periwound Skin Dry/Scaly: Yes Maceration: No Maceration: No Moisture: Maceration: No Moist: No Moist: No Moist: No Dry/Scaly: No Dry/Scaly: No Periwound Skin Color: Atrophie Blanche: No Atrophie Blanche: No Atrophie Blanche: No Cyanosis: No Cyanosis: No Cyanosis: No Ecchymosis: No Ecchymosis: No Ecchymosis: No Erythema: No Erythema: No Erythema: No Hemosiderin Staining: No Hemosiderin Staining: No Hemosiderin Staining: No Mottled: No Mottled: No Mottled: No Pallor: No Pallor: No Pallor: No Rubor: No Rubor: No Rubor: No Temperature: No Abnormality No Abnormality No Abnormality Tenderness on Yes Yes Yes Palpation: Wound Preparation: Ulcer Cleansing: Ulcer Cleansing: Ulcer Cleansing: Rinsed/Irrigated with Rinsed/Irrigated with Rinsed/Irrigated with Saline Saline Saline Topical Anesthetic Topical Anesthetic Topical Anesthetic Applied: Other: lidocaine Applied: Other: lidocaine Applied: Other: lidocaine 4% 4% 4% Wound Number: 4 5 N/A Photos: No Photos No Photos N/A Wound Location: Right Toe Fourth - Dorsal Right Metatarsal head fifth N/A - Dorsal Wounding Event: Gradually Appeared Gradually Appeared N/A 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 N/A Comorbid  History: Anemia, Asthma, Anemia, Asthma, N/A Coronary Artery Disease, Coronary Artery Disease, Hypertension, Type II Hypertension, Type II Diabetes, End Stage Diabetes, End Stage [name], [name] ([id number]) Renal Disease, Renal Disease, Neuropathy Neuropathy Date Acquired: 09/14/2014 09/14/2014 N/A Weeks of Treatment: 2 2 N/A Wound Status: Open Open N/A Measurements L x W x D 1.4x0.6x0.1 1.8x1.5x0.1 N/A (cm) Area (cm) : 0.66 2.121 N/A Volume (cm) : 0.066 0.212 N/A % Reduction in Area: 6.60% 15.60% N/A % Reduction in Volume: 7.00% 15.50% N/A Classification: Partial Thickness Partial Thickness N/A HBO Classification: Grade 1 Grade 1 N/A Exudate Amount: Small Small N/A Exudate Type: Serosanguineous Serosanguineous N/A Exudate Color: red, brown red, brown N/A Wound Margin: Distinct, outline attached Distinct, outline attached N/A Granulation Amount: None Present (0%) None Present (0%) N/A Necrotic Amount: Large (67-100%) Large (67-100%) N/A Necrotic Tissue: Eschar Eschar, Adherent Slough N/A Exposed Structures: Fascia: No Fascia: No N/A Fat: No Fat: No Tendon: No Tendon: No Muscle: No Muscle: No Joint: No Joint: No Bone: No Bone: No Limited to Skin Limited to Skin Breakdown Breakdown  Epithelialization: None None N/A Periwound Skin Texture: Edema: No Edema: No N/A Excoriation: No Excoriation: No Induration: No Induration: No Callus: No Callus: No Crepitus: No Crepitus: No Fluctuance: No Fluctuance: No Friable: No Friable: No Rash: No Rash: No Scarring: No Scarring: No Periwound Skin Maceration: No Maceration: No N/A Moisture: Moist: No Moist: No Dry/Scaly: No Dry/Scaly: No Periwound Skin Color: Atrophie Blanche: No Atrophie Blanche: No N/A Cyanosis: No Cyanosis: No Ecchymosis: No Ecchymosis: No Erythema: No Erythema: No Hemosiderin Staining: No Hemosiderin Staining: No Mottled: No Mottled: No Pallor: No Pallor: No Rubor: No Rubor:  No Temperature: No Abnormality No Abnormality N/A [name], [name] ([id number]) Tenderness on Yes Yes N/A Palpation: Wound Preparation: Ulcer Cleansing: Ulcer Cleansing: N/A Rinsed/Irrigated with Rinsed/Irrigated with Saline Saline Topical Anesthetic Topical Anesthetic Applied: Other: lidocaine Applied: Other: lidocaine 4% 4% Treatment Notes Electronic Signature(s) Signed: 12/01/2014 5:13:09 PM By: Montey Hora Entered By: Montey Hora on 12/01/2014 13:31:09 Axe, Angelyn [name] ([id number]) -------------------------------------------------------------------------------- [place] Details Patient Name: [name], [name] Date of Service: 12/01/2014 1:00 PM Medical Record Number: [id number] Patient Account Number: [id number] Date of Birth/Sex: [birth year] (59 y.o. Male) Treating RN: Montey Hora Primary Care Physician: Tomasa Hose Other Clinician: Referring Physician: Tomasa Hose Treating Physician/Extender: Frann Rider in Treatment: 2 Active Inactive Abuse / Safety / Falls / Self Care Management Nursing Diagnoses: Potential for falls Goals: Patient will remain injury free Date Initiated: 11/17/2014 Goal Status: Active Interventions: Assess fall risk on admission and as needed Notes: Orientation to the Wound Care Program Nursing Diagnoses: Knowledge deficit related to the wound healing center program Goals: Patient/caregiver will verbalize understanding of the [facility] Date Initiated: 11/17/2014 Goal Status: Active Interventions: Provide education on orientation to the wound center Notes: Wound/Skin Impairment Nursing Diagnoses: Knowledge deficit related to smoking impact on wound healing Goals: Patient will demonstrate a reduced rate of smoking or cessation of smoking Date Initiated: 11/17/2014 MCNEILEmileo, [name] ([id number]) Goal Status: Active Ulcer/skin breakdown will have a volume reduction of 30% by week 4 Date  Initiated: 11/17/2014 Goal Status: Active Interventions: Assess ulceration(s) every visit Provide education on smoking Notes: Electronic Signature(s) Signed: 12/01/2014 5:13:09 PM By: Montey Hora Entered By: Montey Hora on 12/01/2014 13:30:17 Neenan, Angelyn [name] ([id number]) -------------------------------------------------------------------------------- Pain Assessment Details Patient Name: [name], [name] Date of Service: 12/01/2014 1:00 PM Medical Record Number: [id number] Patient Account Number: [id number] Date of Birth/Sex: 02-07-1956 (59 y.o. Male) Treating RN: Montey Hora Primary Care Physician: Tomasa Hose Other Clinician: Referring Physician: Tomasa Hose Treating Physician/Extender: Frann Rider in Treatment: 2 Active Problems Location of Pain Severity and Description of Pain Patient Has Paino Yes Site Locations Pain Location: Pain in Ulcers With Dressing Change: Yes Duration of the Pain. Constant / Intermittento Constant Pain Management and Medication Current Pain Management: Electronic Signature(s) Signed: 12/01/2014 5:13:09 PM By: Montey Hora Entered By: Montey Hora on 12/01/2014 13:15:58 Ciulla, Angelyn [name] ([id number]) -------------------------------------------------------------------------------- Patient/Caregiver Education Details Patient Name: Izard, Angelyn [name]. Date of Service: 12/01/2014 1:00 PM Medical Record Number: [id number] Patient Account Number: [id number] Date of Birth/Gender: Sep 11, 1955 (59 y.o. Male) Treating RN: Montey Hora Primary Care Physician: Tomasa Hose Other Clinician: Referring Physician: Tomasa Hose Treating Physician/Extender: Frann Rider in Treatment: 2 Education Assessment Education Provided To: Patient Education Topics Provided Safety: Handouts: Other: fall prevention Methods: Explain/Verbal Responses: State content correctly Wound/Skin Impairment: Handouts: Other: wound care as  ordered Methods: Demonstration, Explain/Verbal Responses: State content correctly Electronic Signature(s) Signed: 12/01/2014 1:34:20 PM By: Montey Hora Entered By: Montey Hora  on 12/01/2014 13:34:20 Coluccio, YONAH SHRYOCK ([id number]) -------------------------------------------------------------------------------- Wound Assessment Details Patient Name: [name], Joel TREVORROW. Date of Service: 12/01/2014 1:00 PM Medical Record Number: [id number] Patient Account Number: [id number] Date of Birth/Sex: 04-18-1956 (59 y.o. Male) Treating RN: Montey Hora Primary Care Physician: Tomasa Hose Other Clinician: Referring Physician: Tomasa Hose Treating Physician/Extender: Frann Rider in Treatment: 2 Wound Status Wound Number: 1 Primary Arterial Insufficiency Ulcer Etiology: Wound Location: Right Metatarsal head first - Dorsal Wound Open Status: Wounding Event: Gradually Appeared Comorbid Anemia, Asthma, Coronary Artery Date Acquired: 09/14/2014 History: Disease, Hypertension, Type II Weeks Of Treatment: 2 Diabetes, End Stage Renal Disease, Clustered Wound: No Neuropathy Wound Measurements Length: (cm) 1.5 Width: (cm) 0.7 Depth: (cm) 0.1 Area: (cm) 0.825 Volume: (cm) 0.082 % Reduction in Area: -3% % Reduction in Volume: -2.5% Epithelialization: None Tunneling: No Undermining: No Wound Description Classification: Partial Thickness Diabetic Severity (Wagner): Grade 1 Wound Margin: Distinct, outline attached Exudate Amount: Small Exudate Type: Serous Exudate Color: amber Wound Bed Granulation Amount: None Present (0%) Exposed Structure Necrotic Amount: Large (67-100%) Fascia Exposed: No Necrotic Quality: Eschar, Adherent Slough Fat Layer Exposed: No Tendon Exposed: No Muscle Exposed: No Joint Exposed: No Bone Exposed: No Limited to Skin Breakdown Periwound Skin Texture Texture Color No Abnormalities Noted: No No Abnormalities Noted: No Callus: No Atrophie  Blanche: No Merolla, Marcelles N. ([id number]) Crepitus: No Cyanosis: No Excoriation: No Ecchymosis: No Fluctuance: No Erythema: No Friable: No Hemosiderin Staining: No Induration: No Mottled: No Localized Edema: No Pallor: No Rash: No Rubor: No Scarring: No Temperature / Pain Moisture Temperature: No Abnormality No Abnormalities Noted: No Tenderness on Palpation: Yes Dry / Scaly: Yes Maceration: No Moist: No Wound Preparation Ulcer Cleansing: Rinsed/Irrigated with Saline Topical Anesthetic Applied: Other: lidocaine 4%, Treatment Notes Wound #1 (Right, Dorsal Metatarsal head first) 1. Cleansed with: Clean wound with Normal Saline 2. Anesthetic Topical Lidocaine 4% cream to wound bed prior to debridement 4. Dressing Applied: Aquacel Ag 5. Secondary Dressing Applied Kerlix/Conform 7. Secured with Recruitment consultant) Signed: 12/01/2014 5:13:09 PM By: Montey Hora Entered By: Montey Hora on 12/01/2014 13:29:15 Lumbra, Angelyn [name] ([id number]) -------------------------------------------------------------------------------- Wound Assessment Details Patient Name: Majette, Jorma N. Date of Service: 12/01/2014 1:00 PM Medical Record Number: [id number] Patient Account Number: [id number] Date of Birth/Sex: 1956/04/22 (59 y.o. Male) Treating RN: Montey Hora Primary Care Physician: Tomasa Hose Other Clinician: Referring Physician: Tomasa Hose Treating Physician/Extender: Frann Rider in Treatment: 2 Wound Status Wound Number: 2 Primary Arterial Insufficiency Ulcer Etiology: Wound Location: Right Toe Second - Dorsal Wound Open Wounding Event: Gradually Appeared Status: Date Acquired: 09/14/2014 Comorbid Anemia, Asthma, Coronary Artery Weeks Of Treatment: 2 History: Disease, Hypertension, Type II Clustered Wound: No Diabetes, End Stage Renal Disease, Neuropathy Wound Measurements Length: (cm) 3.2 Width: (cm) 1.7 Depth: (cm) 0.1 Area: (cm)  4.273 Volume: (cm) 0.427 % Reduction in Area: -3.6% % Reduction in Volume: -3.6% Epithelialization: None Tunneling: No Undermining: No Wound Description Classification: Partial Thickness Diabetic Severity (Wagner): Grade 1 Wound Margin: Distinct, outline attached Exudate Amount: Small Exudate Type: Serous Exudate Color: amber Wound Bed Granulation Amount: None Present (0%) Exposed Structure Necrotic Amount: Large (67-100%) Fascia Exposed: No Necrotic Quality: Eschar Fat Layer Exposed: No Tendon Exposed: No Muscle Exposed: No Joint Exposed: No Bone Exposed: No Limited to Skin Breakdown Periwound Skin Texture Texture Color No Abnormalities Noted: No No Abnormalities Noted: No Callus: No Atrophie Blanche: No [name], [name] ([id number]) Crepitus: No Cyanosis: No Excoriation: No Ecchymosis: No Fluctuance: No Erythema: No Friable: No Hemosiderin  Staining: No Induration: No Mottled: No Localized Edema: No Pallor: No Rash: No Rubor: No Scarring: No Temperature / Pain Moisture Temperature: No Abnormality No Abnormalities Noted: No Tenderness on Palpation: Yes Dry / Scaly: No Maceration: No Moist: No Wound Preparation Ulcer Cleansing: Rinsed/Irrigated with Saline Topical Anesthetic Applied: Other: lidocaine 4%, Treatment Notes Wound #2 (Right, Dorsal Toe Second) 1. Cleansed with: Clean wound with Normal Saline 2. Anesthetic Topical Lidocaine 4% cream to wound bed prior to debridement 4. Dressing Applied: Aquacel Ag 5. Secondary Dressing Applied Kerlix/Conform 7. Secured with Recruitment consultant) Signed: 12/01/2014 5:13:09 PM By: Montey Hora Entered By: Montey Hora on 12/01/2014 13:29:31 Rocchio, Angelyn [name] ([id number]) -------------------------------------------------------------------------------- Wound Assessment Details Patient Name: Stettler, Haywood N. Date of Service: 12/01/2014 1:00 PM Medical Record Number: [id number] Patient Account  Number: [id number] Date of Birth/Sex: Mar 14, 1956 (59 y.o. Male) Treating RN: Montey Hora Primary Care Physician: Tomasa Hose Other Clinician: Referring Physician: Tomasa Hose Treating Physician/Extender: Frann Rider in Treatment: 2 Wound Status Wound Number: 3 Primary Arterial Insufficiency Ulcer Etiology: Wound Location: Right Toe Third - Dorsal Wound Open Wounding Event: Gradually Appeared Status: Date Acquired: 09/14/2014 Comorbid Anemia, Asthma, Coronary Artery Weeks Of Treatment: 2 History: Disease, Hypertension, Type II Clustered Wound: No Diabetes, End Stage Renal Disease, Neuropathy Wound Measurements Length: (cm) 3 Width: (cm) 1.4 Depth: (cm) 0.2 Area: (cm) 3.299 Volume: (cm) 0.66 % Reduction in Area: 6.6% % Reduction in Volume: -87% Epithelialization: None Tunneling: No Undermining: No Wound Description Classification: Partial Thickness Diabetic Severity (Wagner): Grade 1 Wound Margin: Distinct, outline attached Exudate Amount: Small Exudate Type: Serosanguineous Exudate Color: red, brown Wound Bed Granulation Amount: None Present (0%) Exposed Structure Necrotic Amount: Large (67-100%) Fascia Exposed: No Necrotic Quality: Eschar, Adherent Slough Fat Layer Exposed: No Tendon Exposed: No Muscle Exposed: No Joint Exposed: No Bone Exposed: No Limited to Skin Breakdown Periwound Skin Texture Texture Color No Abnormalities Noted: No No Abnormalities Noted: No Callus: No Atrophie Blanche: No Armendarez, Keron N. ([id number]) Crepitus: No Cyanosis: No Excoriation: No Ecchymosis: No Fluctuance: No Erythema: No Friable: No Hemosiderin Staining: No Induration: No Mottled: No Localized Edema: No Pallor: No Rash: No Rubor: No Scarring: No Temperature / Pain Moisture Temperature: No Abnormality No Abnormalities Noted: No Tenderness on Palpation: Yes Dry / Scaly: No Maceration: No Moist: No Wound Preparation Ulcer Cleansing:  Rinsed/Irrigated with Saline Topical Anesthetic Applied: Other: lidocaine 4%, Treatment Notes Wound #3 (Right, Dorsal Toe Third) 1. Cleansed with: Clean wound with Normal Saline 2. Anesthetic Topical Lidocaine 4% cream to wound bed prior to debridement 4. Dressing Applied: Aquacel Ag 5. Secondary Dressing Applied Kerlix/Conform 7. Secured with Recruitment consultant) Signed: 12/01/2014 5:13:09 PM By: Montey Hora Entered By: Montey Hora on 12/01/2014 13:29:43 Kerins, Angelyn [name] ([id number]) -------------------------------------------------------------------------------- Wound Assessment Details Patient Name: Rymer, Efstathios N. Date of Service: 12/01/2014 1:00 PM Medical Record Number: [id number] Patient Account Number: [id number] Date of Birth/Sex: [birth year] (59 y.o. Male) Treating RN: Montey Hora Primary Care Physician: Tomasa Hose Other Clinician: Referring Physician: Tomasa Hose Treating Physician/Extender: Frann Rider in Treatment: 2 Wound Status Wound Number: 4 Primary Arterial Insufficiency Ulcer Etiology: Wound Location: Right Toe Fourth - Dorsal Wound Open Wounding Event: Gradually Appeared Status: Date Acquired: 09/14/2014 Comorbid Anemia, Asthma, Coronary Artery Weeks Of Treatment: 2 History: Disease, Hypertension, Type II Clustered Wound: No Diabetes, End Stage Renal Disease, Neuropathy Wound Measurements Length: (cm) 1.4 Width: (cm) 0.6 Depth: (cm) 0.1 Area: (cm) 0.66 Volume: (cm) 0.066 % Reduction in Area: 6.6% % Reduction in Volume:  7% Epithelialization: None Tunneling: No Undermining: No Wound Description Classification: Partial Thickness Diabetic Severity (Wagner): Grade 1 Wound Margin: Distinct, outline attached Exudate Amount: Small Exudate Type: Serosanguineous Exudate Color: red, brown Wound Bed Granulation Amount: None Present (0%) Exposed Structure Necrotic Amount: Large (67-100%) Fascia Exposed: No Necrotic  Quality: Eschar Fat Layer Exposed: No Tendon Exposed: No Muscle Exposed: No Joint Exposed: No Bone Exposed: No Limited to Skin Breakdown Periwound Skin Texture Texture Color No Abnormalities Noted: No No Abnormalities Noted: No Callus: No Atrophie Blanche: No Ciccone, [place] N. ([id number]) Crepitus: No Cyanosis: No Excoriation: No Ecchymosis: No Fluctuance: No Erythema: No Friable: No Hemosiderin Staining: No Induration: No Mottled: No Localized Edema: No Pallor: No Rash: No Rubor: No Scarring: No Temperature / Pain Moisture Temperature: No Abnormality No Abnormalities Noted: No Tenderness on Palpation: Yes Dry / Scaly: No Maceration: No Moist: No Wound Preparation Ulcer Cleansing: Rinsed/Irrigated with Saline Topical Anesthetic Applied: Other: lidocaine 4%, Treatment Notes Wound #4 (Right, Dorsal Toe Fourth) 1. Cleansed with: Clean wound with Normal Saline 2. Anesthetic Topical Lidocaine 4% cream to wound bed prior to debridement 4. Dressing Applied: Aquacel Ag 5. Secondary Dressing Applied Kerlix/Conform 7. Secured with Recruitment consultant) Signed: 12/01/2014 5:13:09 PM By: Montey Hora Entered By: Montey Hora on 12/01/2014 13:29:56 Holm, Angelyn [name] ([id number]) -------------------------------------------------------------------------------- Wound Assessment Details Patient Name: Thompson, Rodd N. Date of Service: 12/01/2014 1:00 PM Medical Record Number: [id number] Patient Account Number: [id number] Date of Birth/Sex: [birth year] (59 y.o. Male) Treating RN: Montey Hora Primary Care Physician: Tomasa Hose Other Clinician: Referring Physician: Tomasa Hose Treating Physician/Extender: Frann Rider in Treatment: 2 Wound Status Wound Number: 5 Primary Arterial Insufficiency Ulcer Etiology: Wound Location: Right Metatarsal head fifth - Dorsal Wound Open Status: Wounding Event: Gradually Appeared Comorbid Anemia, Asthma, Coronary  Artery Date Acquired: 09/14/2014 History: Disease, Hypertension, Type II Weeks Of Treatment: 2 Diabetes, End Stage Renal Disease, Clustered Wound: No Neuropathy Wound Measurements Length: (cm) 1.8 Width: (cm) 1.5 Depth: (cm) 0.1 Area: (cm) 2.121 Volume: (cm) 0.212 % Reduction in Area: 15.6% % Reduction in Volume: 15.5% Epithelialization: None Tunneling: No Undermining: No Wound Description Classification: Partial Thickness Diabetic Severity (Wagner): Grade 1 Wound Margin: Distinct, outline attached Exudate Amount: Small Exudate Type: Serosanguineous Exudate Color: red, brown Wound Bed Granulation Amount: None Present (0%) Exposed Structure Necrotic Amount: Large (67-100%) Fascia Exposed: No Necrotic Quality: Eschar, Adherent Slough Fat Layer Exposed: No Tendon Exposed: No Muscle Exposed: No Joint Exposed: No Bone Exposed: No Limited to Skin Breakdown Periwound Skin Texture Texture Color No Abnormalities Noted: No No Abnormalities Noted: No Callus: No Atrophie Blanche: No Napolitano, Amante N. ([id number]) Crepitus: No Cyanosis: No Excoriation: No Ecchymosis: No Fluctuance: No Erythema: No Friable: No Hemosiderin Staining: No Induration: No Mottled: No Localized Edema: No Pallor: No Rash: No Rubor: No Scarring: No Temperature / Pain Moisture Temperature: No Abnormality No Abnormalities Noted: No Tenderness on Palpation: Yes Dry / Scaly: No Maceration: No Moist: No Wound Preparation Ulcer Cleansing: Rinsed/Irrigated with Saline Topical Anesthetic Applied: Other: lidocaine 4%, Treatment Notes Wound #5 (Right, Dorsal Metatarsal head fifth) 1. Cleansed with: Clean wound with Normal Saline 2. Anesthetic Topical Lidocaine 4% cream to wound bed prior to debridement 4. Dressing Applied: Aquacel Ag 5. Secondary Dressing Applied Kerlix/Conform 7. Secured with Recruitment consultant) Signed: 12/01/2014 5:13:09 PM By: Montey Hora Entered By:  Montey Hora on 12/01/2014 13:30:09 [place]ham, Angelyn [name] ([id number]) -------------------------------------------------------------------------------- [facility] Details Patient Name: Hanahan, Danell N. Date of Service: 12/01/2014 1:00 PM Medical Record Number: [id number]  Patient Account Number: [id number] Date of Birth/Sex: 02-22-1956 (58 y.o. Male) Treating RN: Montey Hora Primary Care Physician: Tomasa Hose Other Clinician: Referring Physician: Tomasa Hose Treating Physician/Extender: Frann Rider in Treatment: 2 Vital Signs Time Taken: 13:15 Temperature (F): 98.4 Height (in): 73 Pulse (bpm): 70 Weight (lbs): 183 Respiratory Rate (breaths/min): 18 Body Mass Index (BMI): 24.1 Blood Pressure (mmHg): 122/77 Reference Range: 80 - 120 mg / dl Electronic Signature(s) Signed: 12/01/2014 5:13:09 PM By: Montey Hora Entered By: Montey Hora on 12/01/2014 13:18:10

## 2014-12-08 ENCOUNTER — Encounter: Payer: Medicare Other | Admitting: Surgery

## 2014-12-08 DIAGNOSIS — L97511 Non-pressure chronic ulcer of other part of right foot limited to breakdown of skin: Secondary | ICD-10-CM | POA: Diagnosis not present

## 2014-12-09 NOTE — Progress Notes (Signed)
Florido, [name] ([id number]) Visit Report for 12/08/2014 Chief Complaint Document Details Patient Name: [name]. Date of Service: 12/08/2014 3:45 PM Medical Record Number: [id number] Patient Account Number: [id number] Date of Birth/Sex: February 10, 1956 (59 y.o. Male) Treating RN: Primary Care Physician: Tomasa Hose Other Clinician: Referring Physician: Tomasa Hose Treating Physician/Extender: Frann Rider in Treatment: 3 Information Obtained from: Patient Chief Complaint Patient presents to the wound care center today with an open arterial ulcer, on his right foot and also severe [name] for several weeks. Electronic Signature(s) Signed: 12/08/2014 4:54:43 PM By: Christin Fudge MD, FACS Entered By: Christin Fudge on 12/08/2014 16:54:43 [name] ([id number]) -------------------------------------------------------------------------------- HPI Details Patient Name: [name], [name] Date of Service: 12/08/2014 3:45 PM Medical Record Number: [id number] Patient Account Number: [id number] Date of Birth/Sex: August 26, 1955 (59 y.o. Male) Treating RN: Primary Care Physician: Tomasa Hose Other Clinician: Referring Physician: Tomasa Hose Treating Physician/Extender: Frann Rider in Treatment: 3 History of Present Illness Location: right foot [name] with ulcers on his forefoot Quality: Patient reports experiencing burning to affected area(s). Severity: Patient states wound are getting worse. Duration: Patient has had the wound for > 3 months prior to seeking treatment at the wound center Timing: [name] in wound is constant (hurts all the time) Context: The wound appeared gradually over time Modifying Factors: Consults to this date include: vascular consult for angioplasty Associated Signs and Symptoms: Patient reports having: difficulty in using his prosthesis and is wheelchair-bound. HPI Description: Seen by Dr. Leotis [name] on 08/29/2014 with ulceration on 2 toes on the right  foot and dark discoloration worrisome for gangrenous changes on his 2nd and 3rd toes as well.o Given his previous history of below-knee amputation for peripheral disease and ulceration on the left, his multiple atherosclerotic risk factors and his poorly palpable pedal pulses, he was brought in for angiography for further evaluation and potential treatment. He had a percutaneous angioplasty of the right popliteal artery and the right anterior tibial artery and dorsalis pedis artery. He also had angioplasty of the right peroneal artery and the proximal and mid right posterior tibial artery. On 11/07/2014 he's had a duplex of the right lower extremity which showed the ABI was 0.9 and had a multiphasic waveform. Impression was of mild peripheral vascular disease predominantly small vessel disease right ankle. 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 11/22/2014 -- he has been off cigarettes completely since Saturday and is on a nicotine patch and have commended him about this. 12/08/2014 -- he was seen in the vascular office at [place] and has been scheduled for a angiogram at the end of this month -- next Thursday. I have urged him to keep this appointment. Electronic Signature(s) Signed: 12/08/2014 4:55:31 PM By: Christin Fudge MD, FACS Entered By: Christin Fudge on 12/08/2014 16:55:31 [name] ([id number]) -------------------------------------------------------------------------------- Physical Exam Details Patient Name: [name], [name] Date of Service: 12/08/2014 3:45 PM Medical Record Number: [id number] Patient Account Number: [id number] Date of Birth/Sex: 1956-03-12 (59 y.o. Male) Treating RN: Primary Care Physician: Tomasa Hose Other Clinician: Referring Physician: Tomasa Hose Treating Physician/Extender: Frann Rider in Treatment: 3 Constitutional . Pulse regular. Respirations normal  and unlabored. Afebrile. . Eyes Nonicteric. Reactive to light. Ears, Nose, Mouth, and Throat Lips, teeth, and gums WNL.[name] Moist mucosa without lesions . Neck supple and nontender. No palpable supraclavicular or cervical adenopathy. Normal sized without goiter. Respiratory WNL. No retractions.. Cardiovascular Pedal Pulses WNL. No clubbing, cyanosis  or edema. Musculoskeletal Adexa without tenderness or enlargement.. Digits and nails w/o clubbing, cyanosis, infection, petechiae, ischemia, or inflammatory conditions.. Integumentary (Hair, Skin) No suspicious lesions. No crepitus or fluctuance. No peri-wound warmth or erythema. No masses.[name] Psychiatric Judgement and insight Intact.. No evidence of depression, anxiety, or agitation.. Notes the areas are dry in some places in some places a little moist but it is causing him quite a bit of discomfort. Electronic Signature(s) Signed: 12/08/2014 4:56:05 PM By: Christin Fudge MD, FACS Entered By: Christin Fudge on 12/08/2014 16:56:05 [name] ([id number]) -------------------------------------------------------------------------------- Physician Orders Details Patient Name: [name], [name] Date of Service: 12/08/2014 3:45 PM Medical Record Number: [id number] Patient Account Number: [id number] Date of Birth/Sex: 11/19/1955 (59 y.o. Male) Treating RN: Montey Hora Primary Care Physician: Tomasa Hose Other Clinician: Referring Physician: Tomasa Hose Treating Physician/Extender: Frann Rider in Treatment: 3 Verbal / Phone Orders: Yes Clinician: Montey Hora Read Back and Verified: Yes Diagnosis Coding Wound Cleansing Wound #1 Right,Dorsal Metatarsal head first o Clean wound with Normal Saline. o May Shower, gently pat wound dry prior to applying new dressing. - shower prior to wound care, if no shower prior to wound care then do not shower and cleanse wounds with NS prior to wound care Wound #2 Right,Dorsal Toe  Second o Clean wound with Normal Saline. o May Shower, gently pat wound dry prior to applying new dressing. - shower prior to wound care, if no shower prior to wound care then do not shower and cleanse wounds with NS prior to wound care Wound #3 Right,Dorsal Toe Third o Clean wound with Normal Saline. o May Shower, gently pat wound dry prior to applying new dressing. - shower prior to wound care, if no shower prior to wound care then do not shower and cleanse wounds with NS prior to wound care Wound #4 Right,Dorsal Toe Fourth o Clean wound with Normal Saline. o May Shower, gently pat wound dry prior to applying new dressing. - shower prior to wound care, if no shower prior to wound care then do not shower and cleanse wounds with NS prior to wound care Wound #5 Right,Dorsal Metatarsal head fifth o Clean wound with Normal Saline. o May Shower, gently pat wound dry prior to applying new dressing. - shower prior to wound care, if no shower prior to wound care then do not shower and cleanse wounds with NS prior to wound care Anesthetic Wound #1 Right,Dorsal Metatarsal head first o Topical Lidocaine 4% cream applied to wound bed prior to debridement Wound #2 Right,Dorsal Toe Second o Topical Lidocaine 4% cream applied to wound bed prior to debridement Wound #3 Right,Dorsal Toe Third o Topical Lidocaine 4% cream applied to wound bed prior to debridement Wound #4 Right,Dorsal Toe Fourth o Topical Lidocaine 4% cream applied to wound bed prior to debridement Alessandrini, [name] ([id number]) Wound #5 Right,Dorsal Metatarsal head fifth o Topical Lidocaine 4% cream applied to wound bed prior to debridement Primary Wound Dressing Wound #1 Right,Dorsal Metatarsal head first o Other: - hydrogel with ag (tube given to patient) Wound #2 Right,Dorsal Toe Second o Other: - hydrogel with ag (tube given to patient) Wound #3 Right,Dorsal Toe Third o Other: - hydrogel with ag  (tube given to patient) Wound #4 Right,Dorsal Toe Fourth o Other: - hydrogel with ag (tube given to patient) Wound #5 Right,Dorsal Metatarsal head fifth o Other: - hydrogel with ag (tube given to patient) Secondary Dressing Wound #1 Right,Dorsal Metatarsal head first o Conform/Kerlix Wound #2 Right,Dorsal Toe  Second o Conform/Kerlix Wound #3 Right,Dorsal Toe Third o Conform/Kerlix Wound #4 Right,Dorsal Toe Fourth o Conform/Kerlix Wound #5 Right,Dorsal Metatarsal head fifth o Conform/Kerlix Dressing Change Frequency Wound #1 Right,Dorsal Metatarsal head first o Change dressing every day. Wound #2 Right,Dorsal Toe Second o Change dressing every day. Wound #3 Right,Dorsal Toe Third o Change dressing every day. Wound #4 Right,Dorsal Toe Fourth o Change dressing every day. Pennypacker, [name]. ([id number]) Wound #5 Right,Dorsal Metatarsal head fifth o Change dressing every day. Follow-up Appointments Wound #1 Right,Dorsal Metatarsal head first o Return Appointment in 1 week. Wound #2 Right,Dorsal Toe Second o Return Appointment in 1 week. Wound #3 Right,Dorsal Toe Third o Return Appointment in 1 week. Wound #4 Right,Dorsal Toe Fourth o Return Appointment in 1 week. Wound #5 Right,Dorsal Metatarsal head fifth o Return Appointment in 1 week. Additional Orders / Instructions Wound #1 Right,Dorsal Metatarsal head first o Stop Smoking - please provide patient with nicotine patches Wound #2 Right,Dorsal Toe Second o Stop Smoking - please provide patient with nicotine patches Wound #3 Right,Dorsal Toe Third o Stop Smoking - please provide patient with nicotine patches Wound #4 Right,Dorsal Toe Fourth o Stop Smoking - please provide patient with nicotine patches Wound #5 Right,Dorsal Metatarsal head fifth o Stop Smoking - please provide patient with nicotine patches Electronic Signature(s) Signed: 12/08/2014 5:01:54 PM By: Christin Fudge MD,  FACS Signed: 12/08/2014 5:45:58 PM By: Montey Hora Entered By: Montey Hora on 12/08/2014 16:41:08 Limehouse, Angelyn [name] ([id number]) -------------------------------------------------------------------------------- Problem List Details Patient Name: Hoque, Tripton N. Date of Service: 12/08/2014 3:45 PM Medical Record Number: [id number] Patient Account Number: [id number] Date of Birth/Sex: 12/19/1955 (59 y.o. 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 Date of Service: 12/08/2014 3:45 PM Medical Record Number: [id number] Patient Account Number: [id number] Date of Birth/Sex: 07/27/1955 (59 y.o. Male) Treating RN: Primary Care Physician: Tomasa Hose Other Clinician: Referring Physician: Tomasa Hose Treating Physician/Extender: Frann Rider in Treatment: 3 Subjective Chief Complaint Information obtained from Patient Patient presents to the wound care center today with an open arterial ulcer, on his right foot and also severe [name] for several weeks. History of Present Illness  (HPI) The following HPI elements were documented for the patient's wound: Location: right foot [name] with ulcers on his forefoot Quality: Patient reports experiencing burning to affected area(s). Severity: Patient states wound are getting worse. Duration: Patient has had the wound for > 3 months prior to seeking treatment at the wound center Timing: [name] in wound is constant (hurts all the time) Context: The wound appeared gradually over time Modifying Factors: Consults to this date include: vascular consult for angioplasty Associated Signs and Symptoms: Patient reports having: difficulty in using his prosthesis and is wheelchair- bound. Seen by Dr. Leotis [name] on 08/29/2014 with ulceration on 2 toes on the right foot and dark discoloration worrisome for gangrenous  changes on his 2nd and 3rd toes as well. Given his previous history of below- knee amputation for peripheral disease and ulceration on the left, his multiple atherosclerotic risk factors and his poorly palpable pedal pulses, he was brought in for angiography for further evaluation and potential treatment. He had a percutaneous angioplasty of the right popliteal artery and the right anterior tibial artery and dorsalis pedis artery. He also had angioplasty of the right peroneal artery and the proximal and mid right posterior tibial artery. On 11/07/2014 he's had a duplex of the right lower extremity which showed the ABI was 0.9 and had a multiphasic waveform. Impression was of mild peripheral vascular disease predominantly small vessel disease right ankle. 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 11/22/2014 -- he has been off cigarettes completely since Saturday and is on a nicotine patch and have commended him about this. 12/08/2014 -- he was seen in the vascular office at [place] and has been scheduled for a angiogram at the end of this month -- next  Thursday. I have urged him to keep this appointment. Nicol, [name]. ([id number]) Objective Constitutional Pulse regular. Respirations normal and unlabored. Afebrile. Vitals Time Taken: 4:11 PM, Height: 73 in, Weight: 183 lbs, BMI: 24.1, Temperature: 98.2 F, Pulse: 67 bpm, Respiratory Rate: 18 breaths/min, Blood Pressure: 124/79 mmHg. Eyes Nonicteric. Reactive to light. Ears, Nose, Mouth, and Throat Lips, teeth, and gums WNL.[name] Moist mucosa without lesions . Neck supple and nontender. No palpable supraclavicular or cervical adenopathy. Normal sized without goiter. Respiratory WNL. No retractions.. Cardiovascular Pedal Pulses WNL. No clubbing, cyanosis or edema. Musculoskeletal Adexa without tenderness or enlargement.. Digits and nails w/o clubbing, cyanosis, infection, petechiae, ischemia, or inflammatory conditions.[name] Psychiatric Judgement and insight Intact.. No evidence of depression, anxiety, or agitation.. General Notes: the areas are dry in some places in some places a little moist but it is causing him quite a bit of discomfort. Integumentary (Hair, Skin) No suspicious lesions. No crepitus or fluctuance. No peri-wound warmth or erythema. No masses.. Wound #1 status is Open. Original cause of wound was Gradually Appeared. The wound is located on the Right,Dorsal Metatarsal head first. The wound measures 1.6cm length x 0.7cm width x 0.1cm depth; 0.88cm^2 area and 0.088cm^3 volume. Wound #2 status is Open. Original cause of wound was Gradually Appeared. The wound is located on the Right,Dorsal Toe Second. The wound measures 3.2cm length x 1.7cm width x 0.1cm depth; 4.273cm^2 Auxier, [name] ([id number]) area and 0.427cm^3 volume. Wound #3 status is Open. Original cause of wound was Gradually Appeared. The wound is located on the Right,Dorsal Toe Third. The wound measures 3.4cm length x 1.4cm width x 0.2cm depth; 3.738cm^2 area and 0.748cm^3 volume. Wound #4 status is Open.  Original cause of wound was Gradually Appeared. The wound is located on the Right,Dorsal Toe Fourth. The wound measures 1.4cm length x 0.6cm width x 0.1cm depth; 0.66cm^2 area and 0.066cm^3 volume. Wound #5 status is Open. Original cause of wound was Gradually Appeared. The wound is located on the Right,Dorsal Metatarsal head fifth. The wound measures 1.8cm length x 1.5cm width x 0.1cm depth; 2.121cm^2 area and 0.212cm^3 volume. Assessment Active Problems ICD-10 E11.621 - Type 2 diabetes mellitus with foot ulcer I70.235 - Atherosclerosis of native arteries of right leg with ulceration of other part of foot N18.6 - End stage renal disease F17.219 - Nicotine dependence, cigarettes, with unspecified nicotine-induced disorders Z89.512 - Acquired absence of left leg  below knee We will change the dressing over to hydrogel gel with Silver and to be applied liberally over this area and covered with some gauze between his toes and a light dressing. He is encouraged to keep his appointment at the vascular office for the angiogram and then see me back next week. Plan Wound Cleansing: Wound #1 Right,Dorsal Metatarsal head first: Clean wound with Normal Saline. May Shower, gently pat wound dry prior to applying new dressing. - shower prior to wound care, if no shower prior to wound care then do not shower and cleanse wounds with NS prior to wound care Wound #2 Right,Dorsal Toe Second: Clean wound with Normal Saline. May Shower, gently pat wound dry prior to applying new dressing. - shower prior to wound care, if no shower prior to wound care then do not shower and cleanse wounds with NS prior to wound care Wound #3 Right,Dorsal Toe Third: Stogner, [name] ([id number]) Clean wound with Normal Saline. May Shower, gently pat wound dry prior to applying new dressing. - shower prior to wound care, if no shower prior to wound care then do not shower and cleanse wounds with NS prior to wound care Wound  #4 Right,Dorsal Toe Fourth: Clean wound with Normal Saline. May Shower, gently pat wound dry prior to applying new dressing. - shower prior to wound care, if no shower prior to wound care then do not shower and cleanse wounds with NS prior to wound care Wound #5 Right,Dorsal Metatarsal head fifth: Clean wound with Normal Saline. May Shower, gently pat wound dry prior to applying new dressing. - shower prior to wound care, if no shower prior to wound care then do not shower and cleanse wounds with NS prior to wound care Anesthetic: Wound #1 Right,Dorsal Metatarsal head first: Topical Lidocaine 4% cream applied to wound bed prior to debridement Wound #2 Right,Dorsal Toe Second: Topical Lidocaine 4% cream applied to wound bed prior to debridement Wound #3 Right,Dorsal Toe Third: Topical Lidocaine 4% cream applied to wound bed prior to debridement Wound #4 Right,Dorsal Toe Fourth: Topical Lidocaine 4% cream applied to wound bed prior to debridement Wound #5 Right,Dorsal Metatarsal head fifth: Topical Lidocaine 4% cream applied to wound bed prior to debridement Primary Wound Dressing: Wound #1 Right,Dorsal Metatarsal head first: Other: - hydrogel with ag (tube given to patient) Wound #2 Right,Dorsal Toe Second: Other: - hydrogel with ag (tube given to patient) Wound #3 Right,Dorsal Toe Third: Other: - hydrogel with ag (tube given to patient) Wound #4 Right,Dorsal Toe Fourth: Other: - hydrogel with ag (tube given to patient) Wound #5 Right,Dorsal Metatarsal head fifth: Other: - hydrogel with ag (tube given to patient) Secondary Dressing: Wound #1 Right,Dorsal Metatarsal head first: Conform/Kerlix Wound #2 Right,Dorsal Toe Second: Conform/Kerlix Wound #3 Right,Dorsal Toe Third: Conform/Kerlix Wound #4 Right,Dorsal Toe Fourth: Conform/Kerlix Wound #5 Right,Dorsal Metatarsal head fifth: Conform/Kerlix Dressing Change Frequency: Wound #1 Right,Dorsal Metatarsal head first: Change  dressing every day. Wound #2 Right,Dorsal Toe Second: Change dressing every day. Wound #3 Right,Dorsal Toe Third: Change dressing every day. Boivin, [name]. ([id number]) Wound #4 Right,Dorsal Toe Fourth: Change dressing every day. Wound #5 Right,Dorsal Metatarsal head fifth: Change dressing every day. Follow-up Appointments: Wound #1 Right,Dorsal Metatarsal head first: Return Appointment in 1 week. Wound #2 Right,Dorsal Toe Second: Return Appointment in 1 week. Wound #3 Right,Dorsal Toe Third: Return Appointment in 1 week. Wound #4 Right,Dorsal Toe Fourth: Return Appointment in 1 week. Wound #5 Right,Dorsal Metatarsal head fifth: Return Appointment in 1 week.  Additional Orders / Instructions: Wound #1 Right,Dorsal Metatarsal head first: Stop Smoking - please provide patient with nicotine patches Wound #2 Right,Dorsal Toe Second: Stop Smoking - please provide patient with nicotine patches Wound #3 Right,Dorsal Toe Third: Stop Smoking - please provide patient with nicotine patches Wound #4 Right,Dorsal Toe Fourth: Stop Smoking - please provide patient with nicotine patches Wound #5 Right,Dorsal Metatarsal head fifth: Stop Smoking - please provide patient with nicotine patches We will change the dressing over to hydrogel gel with Silver and to be applied liberally over this area and covered with some gauze between his toes and a light dressing. He is encouraged to keep his appointment at the vascular office for the angiogram and then see me back next week. Electronic Signature(s) Signed: 12/08/2014 4:56:56 PM By: Christin Fudge MD, FACS Entered By: Christin Fudge on 12/08/2014 16:56:56 [name], Angelyn [name] ([id number]) -------------------------------------------------------------------------------- SuperBill Details Patient Name: Kandler, Angelyn [name]. Date of Service: 12/08/2014 Medical Record Number: [id number] Patient Account Number: [id number] Date of Birth/Sex: 04/19/1956 (59 y.o.  Male) Treating RN: Primary Care Physician: Tomasa Hose Other Clinician: Referring Physician: Tomasa Hose Treating Physician/Extender: Frann Rider in Treatment: 3 Diagnosis Coding ICD-10 Codes Code Description E11.621 Type 2 diabetes mellitus with foot ulcer I70.235 Atherosclerosis of native arteries of right leg with ulceration of other part of foot N18.6 End stage renal disease F17.219 Nicotine dependence, cigarettes, with unspecified nicotine-induced disorders Z89.512 Acquired absence of left leg below knee Facility Procedures CPT4 Code: [id number] Description: [id number] - WOUND CARE VISIT-LEV 5 EST PT Modifier: Quantity: 1 Physician Procedures CPT4: Description Modifier Quantity Code [id number] - WC PHYS LEVEL 3 - EST PT 1 ICD-10 Description Diagnosis E11.621 Type 2 diabetes mellitus with foot ulcer I70.235 Atherosclerosis of native arteries of right leg with ulceration of other part of  foot Electronic Signature(s) Signed: 12/08/2014 4:57:11 PM By: Christin Fudge MD, FACS Entered By: Christin Fudge on 12/08/2014 16:57:11

## 2014-12-09 NOTE — Progress Notes (Signed)
Stork, PERKINS EDGE ([id number]) Visit Report for 12/08/2014 Arrival Information Details Patient Name: [name], [name]. Date of Service: 12/08/2014 3:45 PM Medical Record Number: [id number] Patient Account Number: [id number] Date of Birth/Sex: 12-14-1955 (59 y.o. Male) Treating RN: Montey Hora Primary Care Physician: Tomasa Hose Other Clinician: Referring Physician: Tomasa Hose Treating Physician/Extender: Frann Rider in Treatment: 3 Visit Information History Since Last Visit Added or deleted any medications: No Patient Arrived: Wheel Chair Any new allergies or adverse reactions: No Arrival Time: 16:10 Had a fall or experienced change in No Accompanied By: cg activities of daily living that may affect Transfer Assistance: None risk of falls: Patient Identification Verified: Yes Signs or symptoms of abuse/neglect since last No Secondary Verification Process Yes visito Completed: Hospitalized since last visit: No Patient Has Alerts: Yes Pain Present Now: Yes Patient Alerts: Patient on Blood Thinner asa 325 DMII Electronic Signature(s) Signed: 12/08/2014 5:45:58 PM By: Montey Hora Entered By: Montey Hora on 12/08/2014 16:11:19 [name], [name] ([id number]) -------------------------------------------------------------------------------- Clinic Level of Care Assessment Details Patient Name: [name], [name] Date of Service: 12/08/2014 3:45 PM Medical Record Number: [id number] Patient Account Number: [id number] Date of Birth/Sex: April 06, 1956 (59 y.o. Male) Treating RN: Montey Hora Primary Care Physician: Tomasa Hose Other Clinician: Referring Physician: Tomasa Hose Treating Physician/Extender: Frann Rider in Treatment: 3 Clinic Level of Care Assessment Items TOOL 4 Quantity Score []  - Use when only an EandM is performed on FOLLOW-UP visit 0 ASSESSMENTS - Nursing Assessment / Reassessment X - Reassessment of Co-morbidities (includes updates in  patient status) 1 10 X - Reassessment of Adherence to Treatment Plan 1 5 ASSESSMENTS - Wound and Skin Assessment / Reassessment []  - Simple Wound Assessment / Reassessment - one wound 0 X - Complex Wound Assessment / Reassessment - multiple wounds 5 5 []  - Dermatologic / Skin Assessment (not related to wound area) 0 ASSESSMENTS - Focused Assessment X - Circumferential Edema Measurements - multi extremities 1 5 []  - Nutritional Assessment / Counseling / Intervention 0 X - Lower Extremity Assessment (monofilament, tuning fork, pulses) 1 5 []  - Peripheral Arterial Disease Assessment (using hand held doppler) 0 ASSESSMENTS - Ostomy and/or Continence Assessment and Care []  - Incontinence Assessment and Management 0 []  - Ostomy Care Assessment and Management (repouching, etc.) 0 PROCESS - Coordination of Care X - Simple Patient / Family Education for ongoing care 1 15 []  - Complex (extensive) Patient / Family Education for ongoing care 0 []  - Staff obtains Programmer, systems, Records, Test Results / Process Orders 0 []  - Staff telephones HHA, Nursing Homes / Clarify orders / etc 0 []  - Routine Transfer to another Facility (non-emergent condition) 0 [name], Joel WARSHAUER. ([id number]) []  - Routine Hospital Admission (non-emergent condition) 0 []  - New Admissions / Biomedical engineer / Ordering NPWT, Apligraf, etc. 0 []  - Emergency Hospital Admission (emergent condition) 0 X - Simple Discharge Coordination 1 10 []  - Complex (extensive) Discharge Coordination 0 PROCESS - Special Needs []  - Pediatric / Minor Patient Management 0 []  - Isolation Patient Management 0 []  - Hearing / Language / Visual special needs 0 []  - Assessment of Community assistance (transportation, D/C planning, etc.) 0 []  - Additional assistance / Altered mentation 0 []  - Support Surface(s) Assessment (bed, cushion, seat, etc.) 0 INTERVENTIONS - Wound Cleansing / Measurement []  - Simple Wound Cleansing - one wound 0 X - Complex  Wound Cleansing - multiple wounds 5 5 X - Wound Imaging (photographs - any number of wounds) 1 5 []  - Wound  Tracing (instead of photographs) 0 []  - Simple Wound Measurement - one wound 0 X - Complex Wound Measurement - multiple wounds 5 5 INTERVENTIONS - Wound Dressings X - Small Wound Dressing one or multiple wounds 5 10 []  - Medium Wound Dressing one or multiple wounds 0 []  - Large Wound Dressing one or multiple wounds 0 []  - Application of Medications - topical 0 []  - Application of Medications - injection 0 INTERVENTIONS - Miscellaneous []  - External ear exam 0 [name], [name] ([id number]) []  - Specimen Collection (cultures, biopsies, blood, body fluids, etc.) 0 []  - Specimen(s) / Culture(s) sent or taken to Lab for analysis 0 []  - Patient Transfer (multiple staff / Harrel Lemon Lift / Similar devices) 0 []  - Simple Staple / Suture removal (25 or less) 0 []  - Complex Staple / Suture removal (26 or more) 0 []  - Hypo / Hyperglycemic Management (close monitor of Blood Glucose) 0 []  - Ankle / Brachial Index (ABI) - do not check if billed separately 0 X - Vital Signs 1 5 Has the patient been seen at the hospital within the last three years: Yes Total Score: 185 Level Of Care: New/Established - Level 5 Electronic Signature(s) Signed: 12/08/2014 5:45:58 PM By: Montey Hora Entered By: Montey Hora on 12/08/2014 16:41:53 [name], [name] ([id number]) -------------------------------------------------------------------------------- Encounter Discharge Information Details Patient Name: [name], [name] Date of Service: 12/08/2014 3:45 PM Medical Record Number: [id number] Patient Account Number: [id number] Date of Birth/Sex: 02/10/1956 (59 y.o. Male) Treating RN: Afful, RN, BSN, Velva Harman Primary Care Physician: Tomasa Hose Other Clinician: Referring Physician: Tomasa Hose Treating Physician/Extender: Frann Rider in Treatment: 3 Encounter Discharge Information Items Discharge Pain  Level: 0 Discharge Condition: Stable Ambulatory Status: Wheelchair Nursing Discharge Destination: Home Transportation: Private Auto Accompanied By: caregiver Schedule Follow-up Appointment: No Medication Reconciliation completed No and provided to Patient/Care Pancho Rushing: Clinical Summary of Care: Electronic Signature(s) Signed: 12/08/2014 5:42:40 PM By: Regan Lemming BSN, RN Entered By: Regan Lemming on 12/08/2014 16:54:13 Smestad, [name] ([id number]) -------------------------------------------------------------------------------- Lower Extremity Assessment Details Patient Name: [name], [name] Date of Service: 12/08/2014 3:45 PM Medical Record Number: [id number] Patient Account Number: [id number] Date of Birth/Sex: [birth year] (59 y.o. Male) Treating RN: Montey Hora Primary Care Physician: Tomasa Hose Other Clinician: Referring Physician: Tomasa Hose Treating Physician/Extender: Frann Rider in Treatment: 3 Vascular Assessment Pulses: Posterior Tibial Dorsalis Pedis Palpable: [Right:Yes] Extremity colors, hair growth, and conditions: Extremity Color: [Right:Hyperpigmented] Hair Growth on Extremity: [Right:No] Temperature of Extremity: [Right:Warm] Capillary Refill: [Right:< 3 seconds] Electronic Signature(s) Signed: 12/08/2014 5:45:58 PM By: Montey Hora Entered By: Montey Hora on 12/08/2014 16:20:30 Cundy, [name] ([id number]) -------------------------------------------------------------------------------- Multi Wound Chart Details Patient Name: [name], [name] Date of Service: 12/08/2014 3:45 PM Medical Record Number: [id number] Patient Account Number: [id number] Date of Birth/Sex: Jan 03, 1956 (59 y.o. 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] Date Acquired: [1:09/14/2014] [2:09/14/2014] [3:09/14/2014] Weeks of Treatment: [1:3] [2:3] [3:3] Wound Status: [1:Open] [2:Open] [3:Open] Measurements L x W x D 1.6x0.7x0.1 [2:3.2x1.7x0.1] [3:3.4x1.4x0.2] (cm) Area (cm) : [1:0.88] [id number] [3:3.738]  Volume (cm) : [1:0.088] [2:0.427] [3:0.748] % Reduction in Area: [1:-9.90%] [2:-3.60%] [3:-5.80%] % Reduction in Volume: -10.00% [2:-3.60%] [3:-111.90%] Classification: [1:Partial Thickness] [2:Partial Thickness] [3:Partial Thickness] Periwound Skin Texture: No Abnormalities Noted No Abnormalities Noted No Abnormalities Noted Periwound Skin [1:No Abnormalities Noted No Abnormalities Noted No Abnormalities Noted] Moisture: Periwound Skin Color: No Abnormalities Noted No Abnormalities Noted No Abnormalities Noted Tenderness on [1:No] [2:No] [3:No] Wound Number: 4 5 N/A Photos: No Photos No Photos N/A Wound Location: Right, Dorsal Toe Fourth Right, Dorsal Metatarsal N/A head fifth Wounding Event: Gradually Appeared Gradually Appeared N/A 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 ([id number]) Wound Status: Open Open N/A Measurements L x W x D 1.4x0.6x0.1 1.8x1.5x0.1 N/A (cm) Area (cm) : 0.66 2.121 N/A Volume (cm) : 0.066 0.212 N/A % Reduction in Area: 6.60% 15.60% N/A % Reduction in Volume: 7.00% 15.50% N/A Classification: Partial Thickness Partial  Thickness N/A Periwound Skin Texture: No Abnormalities Noted No Abnormalities Noted N/A Periwound Skin No Abnormalities Noted No Abnormalities Noted N/A Moisture: Periwound Skin Color: No Abnormalities Noted No Abnormalities Noted N/A Tenderness on No No N/A Palpation: Treatment Notes Electronic Signature(s) Signed: 12/08/2014 5:45:58 PM By: Montey Hora Entered By: Montey Hora on 12/08/2014 16:25:16 Medearis, [name] ([id number]) -------------------------------------------------------------------------------- Multi-Disciplinary Care Plan Details Patient Name: Linnemann, Kaliel N. Date of Service: 12/08/2014 3:45 PM Medical Record Number: [id number] Patient Account Number: [id number] Date of Birth/Sex: 08/01/1955 (59 y.o. Male) Treating RN: Montey Hora Primary Care Physician: Tomasa Hose Other Clinician: Referring Physician: Tomasa Hose Treating Physician/Extender: Frann Rider in Treatment: 3 Active Inactive Abuse / Safety / Falls / Self Care Management Nursing Diagnoses: Potential for falls Goals: Patient will remain injury free Date Initiated: 11/17/2014 Goal Status: Active Interventions: Assess fall risk on admission and as needed Notes: Orientation to the Wound Care Program Nursing Diagnoses: Knowledge deficit related to the wound healing center program Goals: Patient/caregiver will verbalize understanding of the [place] Program Date Initiated: 11/17/2014 Goal Status: Active Interventions: Provide education on orientation to the wound center Notes: Wound/Skin Impairment Nursing Diagnoses: Knowledge deficit related to smoking impact on wound healing Goals: Patient will demonstrate a reduced rate of smoking or cessation of smoking Date Initiated: 11/17/2014 [name], [name] ([id number]) Goal Status: Active Ulcer/skin breakdown will have a volume reduction of 30% by week 4 Date Initiated: 11/17/2014 Goal Status: Active Interventions: Assess  ulceration(s) every visit Provide education on smoking Notes: Electronic Signature(s) Signed: 12/08/2014 5:45:58 PM By: Montey Hora Entered By: Montey Hora on 12/08/2014 16:25:09 [name], [name] ([id number]) -------------------------------------------------------------------------------- Pain Assessment Details Patient Name: Dimarco, Deondrae N. Date of Service: 12/08/2014 3:45 PM Medical Record Number: [id number] Patient Account Number: [id number] Date of Birth/Sex: October 28, 1955 (59 y.o. Male) Treating RN: Montey Hora Primary Care Physician: Tomasa Hose Other Clinician: Referring Physician: Tomasa Hose Treating Physician/Extender: Frann Rider in Treatment: 3 Active Problems Location of Pain Severity and Description of Pain Patient Has Paino Yes Site Locations Pain Location: Pain in Ulcers With Dressing Change: Yes Duration of the Pain. Constant / Intermittento Constant Pain Management and Medication Current Pain Management: Electronic Signature(s) Signed: 12/08/2014 5:45:58 PM By: Montey Hora Entered By: Montey Hora on 12/08/2014 16:11:48 Wimberly, [name] ([id number]) -------------------------------------------------------------------------------- Patient/Caregiver Education Details Patient Name: Carpenter, [name]. Date of Service: 12/08/2014 3:45 PM Medical Record Number: [id number] Patient Account Number: [id number] Date of Birth/Gender: 03-08-1956 (59 y.o. Male) Treating RN: Afful, RN, BSN, [facility]  Physician: Tomasa Hose Other Clinician: Referring Physician: Tomasa Hose Treating Physician/Extender: Frann Rider in Treatment: 3 Education Assessment Education Provided To: Patient and Caregiver Education Topics Provided Smoking and Wound Healing: Methods: Explain/Verbal Responses: State content correctly Welcome To [facility] [place]: Methods: Explain/Verbal Responses: State content correctly Electronic Signature(s) Signed:  12/08/2014 5:42:40 PM By: Regan Lemming BSN, RN Entered By: Regan Lemming on 12/08/2014 16:54:35 Doughty, [name] ([id number]) -------------------------------------------------------------------------------- Wound Assessment Details Patient Name: Grismore, Derrion N. Date of Service: 12/08/2014 3:45 PM Medical Record Number: [id number] Patient Account Number: [id number] Date of Birth/Sex: [birth year] (60 y.o. Male) Treating RN: Montey Hora Primary Care Physician: Tomasa Hose Other Clinician: Referring Physician: Tomasa Hose Treating Physician/Extender: Frann Rider in Treatment: 3 Wound Status Wound Number: 1 Primary Etiology: Arterial Insufficiency Ulcer Wound Location: Right, Dorsal Metatarsal head Wound Status: Open first Wounding Event: Gradually Appeared Date Acquired: 09/14/2014 Weeks Of Treatment: 3 Clustered Wound: No Photos Photo Uploaded By: Gretta Cool, RN, BSN, Kim on 12/08/2014 17:45:37 Wound Measurements Length: (cm) 1.6 Width: (cm) 0.7 Depth: (cm) 0.1 Area: (cm) 0.88 Volume: (cm) 0.088 % Reduction in Area: -9.9% % Reduction in Volume: -10% Wound Description Classification: Partial Thickness Periwound Skin Texture Texture Color No Abnormalities Noted: No No Abnormalities Noted: No Moisture No Abnormalities Noted: No Treatment Notes Wound #1 (Right, Dorsal Metatarsal head first) Torrisi, Cassady N. ([id number]) 1. Cleansed with: Clean wound with Normal Saline 4. Dressing Applied: Hydrogel 5. Secondary Dressing Applied Gauze and Kerlix/Conform 7. Secured with Tape Notes hydrogel with silver gauze wrapped in between toes. Electronic Signature(s) Signed: 12/08/2014 5:45:58 PM By: Montey Hora Entered By: Montey Hora on 12/08/2014 16:25:01 Hudon, [name] ([id number]) -------------------------------------------------------------------------------- Wound Assessment Details Patient Name: Shrider, Anthany N. Date of Service: 12/08/2014 3:45  PM Medical Record Number: [id number] Patient Account Number: [id number] Date of Birth/Sex: July 22, 1955 (59 y.o. Male) Treating RN: Montey Hora Primary Care Physician: Tomasa Hose Other Clinician: Referring Physician: Tomasa Hose Treating Physician/Extender: Frann Rider in Treatment: 3 Wound Status Wound Number: 2 Primary Etiology: Arterial Insufficiency Ulcer Wound Location: Right, Dorsal Toe Second Wound Status: Open Wounding Event: Gradually Appeared Date Acquired: 09/14/2014 Weeks Of Treatment: 3 Clustered Wound: No Photos Photo Uploaded By: Gretta Cool, RN, BSN, Kim on 12/08/2014 17:45:38 Wound Measurements Length: (cm) 3.2 Width: (cm) 1.7 Depth: (cm) 0.1 Area: (cm) 4.273 Volume: (cm) 0.427 % Reduction in Area: -3.6% % Reduction in Volume: -3.6% Wound Description Classification: Partial Thickness Periwound Skin Texture Texture Color No Abnormalities Noted: No No Abnormalities Noted: No Moisture No Abnormalities Noted: No Treatment Notes Wound #2 (Right, Dorsal Toe Second) 1. Cleansed with: Mantione, Delonte N. ([id number]) Clean wound with Normal Saline 4. Dressing Applied: Hydrogel 5. Secondary Dressing Applied Gauze and Kerlix/Conform 7. Secured with Tape Notes hydrogel with silver gauze wrapped in between toes. Electronic Signature(s) Signed: 12/08/2014 5:45:58 PM By: Montey Hora Entered By: Montey Hora on 12/08/2014 16:25:01 Loudenslager, [name] ([id number]) -------------------------------------------------------------------------------- Wound Assessment Details Patient Name: Yackley, Rishikesh N. Date of Service: 12/08/2014 3:45 PM Medical Record Number: [id number] Patient Account Number: [id number] Date of Birth/Sex: Jul 06, 1955 (59 y.o. Male) Treating RN: Montey Hora Primary Care Physician: Tomasa Hose Other Clinician: Referring Physician: Tomasa Hose Treating Physician/Extender: Frann Rider in Treatment: 3 Wound Status Wound  Number: 3 Primary Etiology: Arterial Insufficiency Ulcer Wound Location: Right, Dorsal Toe Third Wound Status: Open Wounding Event: Gradually Appeared Date Acquired: 09/14/2014 Weeks Of Treatment: 3 Clustered Wound: No Photos Photo Uploaded By: Gretta Cool, RN, BSN, Kim on 12/08/2014 17:52:02 Wound Measurements Length: (cm)  3.4 Width: (cm) 1.4 Depth: (cm) 0.2 Area: (cm) 3.738 Volume: (cm) 0.748 % Reduction in Area: -5.8% % Reduction in Volume: -111.9% Wound Description Classification: Partial Thickness Periwound Skin Texture Texture Color No Abnormalities Noted: No No Abnormalities Noted: No Moisture No Abnormalities Noted: No Treatment Notes Wound #3 (Right, Dorsal Toe Third) 1. Cleansed with: Stamper, Tayshun N. ([id number]) Clean wound with Normal Saline 4. Dressing Applied: Hydrogel 5. Secondary Dressing Applied Gauze and Kerlix/Conform 7. Secured with Tape Notes hydrogel with silver gauze wrapped in between toes. Electronic Signature(s) Signed: 12/08/2014 5:45:58 PM By: Montey Hora Entered By: Montey Hora on 12/08/2014 16:25:01 Hickok, [name] ([id number]) -------------------------------------------------------------------------------- Wound Assessment Details Patient Name: Emigh, Tryone N. Date of Service: 12/08/2014 3:45 PM Medical Record Number: [id number] Patient Account Number: [id number] Date of Birth/Sex: 09-Mar-1956 (60 y.o. Male) Treating RN: Montey Hora Primary Care Physician: Tomasa Hose Other Clinician: Referring Physician: Tomasa Hose Treating Physician/Extender: Frann Rider in Treatment: 3 Wound Status Wound Number: 4 Primary Etiology: Arterial Insufficiency Ulcer Wound Location: Right, Dorsal Toe Fourth Wound Status: Open Wounding Event: Gradually Appeared Date Acquired: 09/14/2014 Weeks Of Treatment: 3 Clustered Wound: No Photos Photo Uploaded By: Gretta Cool, RN, BSN, Kim on 12/08/2014 17:52:02 Wound Measurements Length:  (cm) 1.4 Width: (cm) 0.6 Depth: (cm) 0.1 Area: (cm) 0.66 Volume: (cm) 0.066 % Reduction in Area: 6.6% % Reduction in Volume: 7% Wound Description Classification: Partial Thickness Periwound Skin Texture Texture Color No Abnormalities Noted: No No Abnormalities Noted: No Moisture No Abnormalities Noted: No Treatment Notes Wound #4 (Right, Dorsal Toe Fourth) 1. Cleansed with: Rickels, Dajohn N. ([id number]) Clean wound with Normal Saline 4. Dressing Applied: Hydrogel 5. Secondary Dressing Applied Gauze and Kerlix/Conform 7. Secured with Tape Notes hydrogel with silver gauze wrapped in between toes. Electronic Signature(s) Signed: 12/08/2014 5:45:58 PM By: Montey Hora Entered By: Montey Hora on 12/08/2014 16:25:01 Hertzog, [name] ([id number]) -------------------------------------------------------------------------------- Wound Assessment Details Patient Name: Mcdade, Ordean N. Date of Service: 12/08/2014 3:45 PM Medical Record Number: [id number] Patient Account Number: [id number] Date of Birth/Sex: 10/09/1955 (59 y.o. Male) Treating RN: Montey Hora Primary Care Physician: Tomasa Hose Other Clinician: Referring Physician: Tomasa Hose Treating Physician/Extender: Frann Rider in Treatment: 3 Wound Status Wound Number: 5 Primary Etiology: Arterial Insufficiency Ulcer Wound Location: Right, Dorsal Metatarsal head Wound Status: Open fifth Wounding Event: Gradually Appeared Date Acquired: 09/14/2014 Weeks Of Treatment: 3 Clustered Wound: No Photos Photo Uploaded By: Gretta Cool, RN, BSN, Kim on 12/08/2014 17:47:06 Wound Measurements Length: (cm) 1.8 Width: (cm) 1.5 Depth: (cm) 0.1 Area: (cm) 2.121 Volume: (cm) 0.212 % Reduction in Area: 15.6% % Reduction in Volume: 15.5% Wound Description Classification: Partial Thickness Periwound Skin Texture Texture Color No Abnormalities Noted: No No Abnormalities Noted: No Moisture No Abnormalities  Noted: No Treatment Notes Wound #5 (Right, Dorsal Metatarsal head fifth) Skipper, Audra N. ([id number]) 1. Cleansed with: Clean wound with Normal Saline 4. Dressing Applied: Hydrogel 5. Secondary Dressing Applied Gauze and Kerlix/Conform 7. Secured with Tape Notes hydrogel with silver gauze wrapped in between toes. Electronic Signature(s) Signed: 12/08/2014 5:45:58 PM By: Montey Hora Entered By: Montey Hora on 12/08/2014 16:25:01 Scholten, [name] ([id number]) -------------------------------------------------------------------------------- [place] Details Patient Name: Capraro, [name]. Date of Service: 12/08/2014 3:45 PM Medical Record Number: [id number] Patient Account Number: [id number] Date of Birth/Sex: 05/13/1956 (59 y.o. Male) Treating RN: Montey Hora Primary Care Physician: Tomasa Hose Other Clinician: Referring Physician: Tomasa Hose Treating Physician/Extender: Frann Rider in Treatment: 3 Vital Signs Time Taken: 16:11 Temperature (F): 98.2 Height (in): 73 Pulse (  bpm): 67 Weight (lbs): 183 Respiratory Rate (breaths/min): 18 Body Mass Index (BMI): 24.1 Blood Pressure (mmHg): 124/79 Reference Range: 80 - 120 mg / dl Electronic Signature(s) Signed: 12/08/2014 5:45:58 PM By: Montey Hora Entered By: Montey Hora on 12/08/2014 16:15:02

## 2014-12-15 ENCOUNTER — Encounter
Admission: RE | Disposition: A | Discharge status: Skilled Nursing Facility | Payer: Self-pay | Source: Ambulatory Visit | Attending: Vascular Surgery

## 2014-12-15 ENCOUNTER — Ambulatory Visit
Admission: RE | Admit: 2014-12-15 | Discharge: 2014-12-15 | Disposition: A | Discharge status: Skilled Nursing Facility | Payer: Medicare Other | Source: Ambulatory Visit | Attending: Vascular Surgery | Admitting: Vascular Surgery

## 2014-12-15 ENCOUNTER — Encounter: Payer: Self-pay | Admitting: *Deleted

## 2014-12-15 DIAGNOSIS — Z79899 Other long term (current) drug therapy: Secondary | ICD-10-CM | POA: Diagnosis not present

## 2014-12-15 DIAGNOSIS — I2510 Atherosclerotic heart disease of native coronary artery without angina pectoris: Secondary | ICD-10-CM | POA: Insufficient documentation

## 2014-12-15 DIAGNOSIS — F17200 Nicotine dependence, unspecified, uncomplicated: Secondary | ICD-10-CM | POA: Insufficient documentation

## 2014-12-15 DIAGNOSIS — Z7902 Long term (current) use of antithrombotics/antiplatelets: Secondary | ICD-10-CM | POA: Insufficient documentation

## 2014-12-15 DIAGNOSIS — I120 Hypertensive chronic kidney disease with stage 5 chronic kidney disease or end stage renal disease: Secondary | ICD-10-CM | POA: Insufficient documentation

## 2014-12-15 DIAGNOSIS — I509 Heart failure, unspecified: Secondary | ICD-10-CM | POA: Diagnosis not present

## 2014-12-15 DIAGNOSIS — I70235 Atherosclerosis of native arteries of right leg with ulceration of other part of foot: Secondary | ICD-10-CM | POA: Insufficient documentation

## 2014-12-15 DIAGNOSIS — E780 Pure hypercholesterolemia: Secondary | ICD-10-CM | POA: Insufficient documentation

## 2014-12-15 DIAGNOSIS — N186 End stage renal disease: Secondary | ICD-10-CM | POA: Insufficient documentation

## 2014-12-15 DIAGNOSIS — L97509 Non-pressure chronic ulcer of other part of unspecified foot with unspecified severity: Secondary | ICD-10-CM | POA: Diagnosis not present

## 2014-12-15 DIAGNOSIS — Z89519 Acquired absence of unspecified leg below knee: Secondary | ICD-10-CM | POA: Insufficient documentation

## 2014-12-15 DIAGNOSIS — Z7951 Long term (current) use of inhaled steroids: Secondary | ICD-10-CM | POA: Insufficient documentation

## 2014-12-15 HISTORY — PX: PERIPHERAL VASCULAR CATHETERIZATION: SHX172C

## 2014-12-15 LAB — BASIC METABOLIC PANEL
Anion gap: 11 (ref 5–15)
BUN: 43 mg/dL — ABNORMAL HIGH (ref 6–20)
CHLORIDE: 94 mmol/L — AB (ref 101–111)
CO2: 30 mmol/L (ref 22–32)
Calcium: 9.5 mg/dL (ref 8.9–10.3)
Creatinine, Ser: 9.04 mg/dL — ABNORMAL HIGH (ref 0.61–1.24)
GFR calc non Af Amer: 6 mL/min — ABNORMAL LOW (ref >60)
GFR, EST AFRICAN AMERICAN: 6 mL/min — AB (ref >60)
Glucose, Bld: 94 mg/dL (ref 65–99)
Potassium: 4.5 mmol/L (ref 3.5–5.1)
Sodium: 135 mmol/L (ref 135–145)

## 2014-12-15 SURGERY — LOWER EXTREMITY ANGIOGRAPHY
Anesthesia: Moderate Sedation | Laterality: Right

## 2014-12-15 MED ORDER — ATROPINE SULFATE 0.1 MG/ML IJ SOLN: 0.5000 mg | Freq: Once | INTRAMUSCULAR | Status: DC | PRN

## 2014-12-15 MED ORDER — ACETAMINOPHEN 325 MG PO TABS: 325.0000 mg | ORAL_TABLET | ORAL | Status: DC | PRN

## 2014-12-15 MED ORDER — FENTANYL CITRATE (PF) 100 MCG/2ML IJ SOLN
INTRAMUSCULAR | Status: AC
  Filled 2014-12-15: qty 2

## 2014-12-15 MED ORDER — HYDRALAZINE HCL 20 MG/ML IJ SOLN: 5.0000 mg | INTRAMUSCULAR | Status: DC | PRN

## 2014-12-15 MED ORDER — HYDROMORPHONE HCL 1 MG/ML IJ SOLN: 0.5000 mg | INTRAMUSCULAR | Status: DC | PRN

## 2014-12-15 MED ORDER — LABETALOL HCL 5 MG/ML IV SOLN: 10.0000 mg | INTRAVENOUS | Status: DC | PRN

## 2014-12-15 MED ORDER — HYDROMORPHONE HCL 1 MG/ML IJ SOLN
1.0000 mg | INTRAMUSCULAR | Status: DC | PRN
  Administered 2014-12-15: 1 mg via INTRAVENOUS

## 2014-12-15 MED ORDER — MIDAZOLAM HCL 2 MG/2ML IJ SOLN
INTRAMUSCULAR | Status: DC | PRN
  Administered 2014-12-15: 2 mg via INTRAVENOUS

## 2014-12-15 MED ORDER — SODIUM CHLORIDE 0.9 % IV SOLN
INTRAVENOUS | Status: DC
  Administered 2014-12-15: 08:00:00 via INTRAVENOUS

## 2014-12-15 MED ORDER — LIDOCAINE-EPINEPHRINE (PF) 1 %-1:200000 IJ SOLN
INTRAMUSCULAR | Status: AC
  Filled 2014-12-15: qty 30

## 2014-12-15 MED ORDER — ONDANSETRON HCL 4 MG/2ML IJ SOLN: 4.0000 mg | INTRAMUSCULAR | Status: DC | PRN

## 2014-12-15 MED ORDER — HEPARIN SODIUM (PORCINE) 1000 UNIT/ML IJ SOLN
INTRAMUSCULAR | Status: AC
  Filled 2014-12-15: qty 1

## 2014-12-15 MED ORDER — PHENOL 1.4 % MT LIQD: 1.0000 | OROMUCOSAL | Status: DC | PRN

## 2014-12-15 MED ORDER — ACETAMINOPHEN 325 MG RE SUPP
325.0000 mg | RECTAL | Status: DC | PRN
Start: 2014-12-15 — End: 2014-12-15
  Filled 2014-12-15: qty 2

## 2014-12-15 MED ORDER — FENTANYL CITRATE (PF) 100 MCG/2ML IJ SOLN
INTRAMUSCULAR | Status: DC | PRN
  Administered 2014-12-15: 50 ug via INTRAVENOUS

## 2014-12-15 MED ORDER — IOHEXOL 300 MG/ML  SOLN
INTRAMUSCULAR | Status: DC | PRN
  Administered 2014-12-15: 75 mL via INTRA_ARTERIAL

## 2014-12-15 MED ORDER — MIDAZOLAM HCL 5 MG/5ML IJ SOLN
INTRAMUSCULAR | Status: AC
  Filled 2014-12-15: qty 5

## 2014-12-15 MED ORDER — HYDROMORPHONE HCL 1 MG/ML IJ SOLN
INTRAMUSCULAR | Status: AC
  Administered 2014-12-15: 1 mg via INTRAVENOUS
  Filled 2014-12-15: qty 1

## 2014-12-15 MED ORDER — METOPROLOL TARTRATE 1 MG/ML IV SOLN
2.0000 mg | INTRAVENOUS | Status: DC | PRN
Start: 2014-12-15 — End: 2014-12-15

## 2014-12-15 MED ORDER — CEFAZOLIN SODIUM 1-5 GM-% IV SOLN
1.0000 g | Freq: Once | INTRAVENOUS | Status: AC
  Administered 2014-12-15: 1 g via INTRAVENOUS

## 2014-12-15 MED ORDER — CEFAZOLIN SODIUM 1-5 GM-% IV SOLN
INTRAVENOUS | Status: AC
  Filled 2014-12-15: qty 50

## 2014-12-15 MED ORDER — OXYCODONE-ACETAMINOPHEN 5-325 MG PO TABS: 1.0000 | ORAL_TABLET | ORAL | Status: DC | PRN

## 2014-12-15 MED ORDER — ONDANSETRON HCL 4 MG/2ML IJ SOLN: 4.0000 mg | Freq: Four times a day (QID) | INTRAMUSCULAR | Status: DC | PRN

## 2014-12-15 MED ORDER — HEPARIN SODIUM (PORCINE) 1000 UNIT/ML IJ SOLN
INTRAMUSCULAR | Status: DC | PRN
  Administered 2014-12-15: 4000 [IU] via INTRAVENOUS

## 2014-12-15 MED ORDER — GUAIFENESIN-DM 100-10 MG/5ML PO SYRP: 15.0000 mL | ORAL_SOLUTION | ORAL | Status: DC | PRN

## 2014-12-15 SURGICAL SUPPLY — 20 items
BALLN LUTONIX DCB 5X60X130 (BALLOONS) ×4
BALLN ULTRVRSE 2.5X220X150 (BALLOONS) ×4
BALLN ULTRVRSE 2X300X150 (BALLOONS) ×2
BALLN ULTRVRSE 2X300X150 OTW (BALLOONS) ×2
BALLOON LUTONIX DCB 5X60X130 (BALLOONS) ×2 IMPLANT
BALLOON ULTRVRSE 2.5X220X150 (BALLOONS) ×2 IMPLANT
BALLOON ULTRVRSE 2X300X150 OTW (BALLOONS) ×2 IMPLANT
CATH CXI SUPP ANG 4FR 135 (MICROCATHETER) ×2 IMPLANT
CATH CXI SUPP ANG 4FR 135CM (MICROCATHETER) ×4
CATH KMP 5.0X100 (CATHETERS) ×4 IMPLANT
CATH ROYAL FLUSH PIG 5F 70CM (CATHETERS) ×4 IMPLANT
DEVICE STARCLOSE SE CLOSURE (Vascular Products) ×4 IMPLANT
GLIDEWIRE ADV .035X260CM (WIRE) ×4 IMPLANT
GUIDEWIRE PFTE-COATED .018X300 (WIRE) ×4 IMPLANT
PACK ANGIOGRAPHY (CUSTOM PROCEDURE TRAY) ×4 IMPLANT
SHEATH ANL2 6FRX45 HC (SHEATH) ×4 IMPLANT
SHEATH BRITE TIP 5FRX11 (SHEATH) ×4 IMPLANT
SYR MEDRAD MARK V 150ML (SYRINGE) ×4 IMPLANT
TUBING CONTRAST HIGH PRESS 72 (TUBING) ×4 IMPLANT
WIRE J 3MM .035X145CM (WIRE) ×4 IMPLANT

## 2014-12-15 NOTE — Discharge Instructions (Signed)
Check right groin for bleeding or hematoma.  Patient will be on bedrest for 2 hours post sheath pull---out of bed at______________.  Bilateral pulses are ___________.Groin Insertion Instructions-If you lose feeling or develop tingling or pain in your leg or foot after the procedure, please walk around first.  If the discomfort does not improve , contact your physician and proceed to the nearest emergency room.  Loss of feeling in your leg might mean that a blockage has formed in the artery and this can be appropriately treated.  Limit your activity for the next two days after your procedure.  Avoid stooping, bending, heavy lifting or exertion as this may put pressure on the insertion site.  Resume normal activities in 48 hours.  You may shower after 24 hours but avoid excessive warm water and do not scrub the site.  Remove clear dressing in 48 hours.  If you have had a closure device inserted, do not soak in a tub bath or a hot tub for at least one week.  No driving for 48 hours after discharge.  After the procedure, check the insertion site occasionally.  If any oozing occurs or there is apparent swelling, firm pressure over the site will prevent a bruise from forming.  You can not hurt anything by pressing directly on the site.  The pressure stops the bleeding by allowing a small clot to form.  If the bleeding continues after the pressure has been applied for more than 15 minutes, call 911 or go to the nearest emergency room.    The x-ray dye causes you to pass a considerate amount of urine.  For this reason, you will be asked to drink plenty of liquids after the procedure to prevent dehydration.  You may resume you regular diet.  Avoid caffeine products.    For pain at the site of your procedure, take non-aspirin medicines such as Tylenol.  Medications: A. Hold Metformin for 48 hours if applicable.  B. Continue taking all your present medications at home unless your doctor prescribes any changes.

## 2014-12-15 NOTE — OR Nursing (Signed)
Spoke with nurse at [facility] with report s/p procedure, discharge instructions along with follow up appt. Given to family sister

## 2014-12-15 NOTE — H&P (Signed)
[facility] History & Physical Update  The patient was interviewed and re-examined.  The patient's previous History and Physical has been reviewed and is unchanged.  There is no change in the plan of care. We plan to proceed with the scheduled procedure.  Bo Rogue, MD  12/15/2014, 8:06 AM

## 2014-12-15 NOTE — Progress Notes (Signed)
Nicotine patch noted intact left upper chest, dressing cdi right foot, left bka

## 2014-12-15 NOTE — CV Procedure (Signed)
[facility] Percutaneous Study/Intervention Procedural Note   Date of Surgery: 12/15/2014  Surgeon(s):Jazmin Vensel   Assistants:none  Pre-operative Diagnosis: PAD with ulceration right lower extremity  Post-operative diagnosis: Same  Procedure(s) Performed: 1. Ultrasound guidance for vascular access left femoral artery 2. Catheter placement into right anterior tibial artery, peroneal artery, and posterior tibial artery from left femoral approach 3. Aortogram and selective right lower extremity angiogram 4. Percutaneous transluminal angioplasty of right popliteal artery with 5 mm diameter Lutonix drug-coated angioplasty balloon 5. Percutaneous transluminal angioplasty of distal right anterior tibial artery with 2.5 mm diameter angioplasty balloon  6.  Percutaneous transluminal angioplasty of long segment right peroneal artery for multiple occlusions with 2.5 mm diameter angioplasty balloon 7.  Percutaneous transluminal angioplasty of proximal and mid right posterior tibial artery for occlusion with 2.5 mm diameter angioplasty balloon  8. StarClose closure device left femoral artery  EBL: 25 cc  Indications: Patient is a 59 year old male  with end-stage renal disease and severe peripheral vascular disease. 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 He has nonhealing ulcers on the right lower extremity. The patient has noninvasive study showing noncompressible vessels on the right. The patient is brought in for angiography for further evaluation and potential treatment. Risks and benefits are discussed and informed consent is obtained  Procedure: The patient was identified and appropriate procedural time out was performed. The patient was then placed supine on the table and prepped and draped in the usual sterile fashion. Ultrasound was used to evaluate  the left common femoral artery. It was patent . A digital ultrasound image was acquired. A Seldinger needle was used to access the left common femoral artery under direct ultrasound guidance and a permanent image was performed. A 0.035 J wire was advanced without resistance and a 5Fr sheath was placed. Pigtail catheter was placed into the aorta and an AP aortogram was performed. This demonstrated sluggish flow within the renal arteries as would be expected with end-stage renal disease and normal aorta and iliac segments without significant stenosis. I then crossed the aortic bifurcation and advanced to the right femoral head. Selective right lower extremity angiogram was then performed. Due to slow flow through the leg, the catheters had to be advanced distally and into the tibial artery separately to fully opacify the lesions. I cannulated the superficial femoral artery to evaluate the SFA and popliteal artery. I then cannulated the anterior tibial artery, proximal peroneal artery, and proximal posterior tibial artery and imaging was performed. This demonstrated about a 65-70% stenosis in the popliteal artery just below the knee and severe tibial disease with occlusion of the dominant anterior tibial artery in its distal segments, long segment occlusion of the posterior tibial artery, and multiple areas of stenosis and occlusion within the peroneal artery. At this point, I elected to try to treat as much as possible to improve his flow to his foot. The patient was systemically heparinized and a 6 [place] [employer] sheath was then placed over the [employer] wire. I then used a Kumpe catheter and the advantage wire to navigate through the popliteal artery stenosis. I removed the Kumpe catheter and treated this lesion with a 5 mm diameter by 6 cm length Lutonix drug-coated angioplasty balloon in the popliteal artery. Completion angiogram following angioplasty showed about a 10-15% residual stenosis. I then  exchanged for a CXI catheter and my first target was the anterior tibial artery. Using a 0.018 advantage wire and the CXI catheter I was  able to advance through the anterior tibial artery occlusion and into the dorsalis pedis artery in the foot. At this point I confirmed intraluminal flow with the CXI catheter and elected to treat the lesion. The catheter was removed and the wire was replaced and a 2.5 mm diameter angioplasty balloon was inflated in the distal anterior tibial artery and dorsalis pedis artery. With the catheter back in the proximal anterior tibial artery completion angiogram showed continuous flow was about 30% residual stenosis just above the ankle but this was markedly improved. I then turned my attention to the peroneal artery. I replaced the CXI catheter and the 0.018 advantage wire and was able to navigate through the peroneal artery occlusion surprisingly well and into the lateral tarsal artery. I then selected the 2.5 mm diameter angioplasty balloon inflated from the distal peroneal artery up through the tibioperoneal trunk. The balloon was inflated to 10 atm with 2 inflations were required due to the length of the lesion. The balloon was then removed and completion angiogram showed markedly improved flow. There were several areas with mild to moderate residual stenosis in the 40-50% range with the vessel was continuous. I then turned my attention to the posterior tibial artery. I was able to navigate the 0.018 advantage wire that was replaced and the CXI catheter into the mid to distal posterior tibial artery. I treated the proximal and mid posterior tibial artery with a 2 mm diameter by 30 cm length angioplasty balloon inflated up through the tibioperoneal trunk. This resulted in improvement in flow in the proximal posterior tibial artery, but continued occlusion distally without distal reconstitution. I did not advance the catheter further and at this point felt we had improved his tibial  circulation enough to heal his wounds.. I elected to terminate the procedure. The sheath was removed and StarClose closure device was deployed in the left femoral artery with excellent hemostatic result. The patient was taken to the recovery room in stable condition having tolerated the procedure well.  Findings:  Aortogram: No aortoiliac stenosis. Sluggish flow and renal arteries. Right Lower Extremity: Moderate 65-70% popliteal artery stenosis and disease in all 3 tibial vessels improved with intervention   Disposition: Patient was taken to the recovery room in stable condition having tolerated the procedure well.  Complications: None  [name] 12/15/2014 9:16 AM

## 2014-12-16 ENCOUNTER — Encounter: Payer: Self-pay | Admitting: Vascular Surgery

## 2014-12-22 ENCOUNTER — Encounter: Payer: Self-pay | Admitting: General Surgery

## 2014-12-22 ENCOUNTER — Encounter (HOSPITAL_BASED_OUTPATIENT_CLINIC_OR_DEPARTMENT_OTHER): Payer: Medicare Other | Admitting: General Surgery

## 2014-12-22 DIAGNOSIS — E1059 Type 1 diabetes mellitus with other circulatory complications: Secondary | ICD-10-CM

## 2014-12-22 DIAGNOSIS — E1052 Type 1 diabetes mellitus with diabetic peripheral angiopathy with gangrene: Secondary | ICD-10-CM | POA: Diagnosis not present

## 2014-12-22 DIAGNOSIS — L97511 Non-pressure chronic ulcer of other part of right foot limited to breakdown of skin: Secondary | ICD-10-CM | POA: Diagnosis not present

## 2014-12-22 NOTE — Progress Notes (Signed)
See i heal 

## 2014-12-23 NOTE — Progress Notes (Addendum)
Orcutt, PETRONILO LAYMAN ([id number]) Visit Report for 12/22/2014 Chief Complaint Document Details Patient Name: [name], [name]. Date of Service: 12/22/2014 1:45 PM Medical Record Patient Account Number: [id number] Number: Afful, RN, BSN, Treating RN: Date of Birth/Sex: Jul 17, 1955 (59 y.o. Male) [facility], Other Clinician: Physician: Ivette Loyal Treating [name], Physician/Extender: Referring Physician: Peri Maris in Treatment: 5 Information Obtained from: Patient Chief Complaint Patient presents to the wound care center today with an open arterial ulcer, on his right foot and also severe [name] for several weeks. Electronic Signature(s) Signed: 12/22/2014 2:55:50 PM By: Judene Companion MD Entered By: Judene Companion on 12/22/2014 14:55:49 Kohlman, [name] ([id number]) -------------------------------------------------------------------------------- HPI Details Patient Name: [name], [name] Date of Service: 12/22/2014 1:45 PM Medical Record Patient Account Number: [id number] Number: Afful, RN, BSN, Treating RN: Date of Birth/Sex: 17-Oct-1955 (59 y.o. Male) [facility], Other Clinician: Physician: Ivette Loyal Treating [name] [name], Physician/Extender: Referring Physician: Peri Maris in Treatment: 5 History of Present Illness Location: right foot [name] with ulcers on his forefoot Quality: Patient reports experiencing burning to affected area(s). Severity: Patient states wound are getting worse. Duration: Patient has had the wound for > 3 months prior to seeking treatment at the wound center Timing: [name] in wound is constant (hurts all the time) Context: The wound appeared gradually over time Modifying Factors: Consults to this date include: vascular consult for angioplasty Associated Signs and Symptoms: Patient reports having: difficulty in using his prosthesis and is wheelchair-bound. HPI  Description: Seen by Dr. Leotis [name] on 08/29/2014 with ulceration on 2 toes on the right foot and dark discoloration worrisome for gangrenous changes on his 2nd and 3rd toes as well.o Given his previous history of below-knee amputation for peripheral disease and ulceration on the left, his multiple atherosclerotic risk factors and his poorly palpable pedal pulses, he was brought in for angiography for further evaluation and potential treatment. He had a percutaneous angioplasty of the right popliteal artery and the right anterior tibial artery and dorsalis pedis artery. He also had angioplasty of the right peroneal artery and the proximal and mid right posterior tibial artery. On 11/07/2014 he's had a duplex of the right lower extremity which showed the ABI was 0.9 and had a multiphasic waveform. Impression was of mild peripheral vascular disease predominantly small vessel disease right ankle. 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 11/22/2014 -- he has been off cigarettes completely since Saturday and is on a nicotine patch and have commended him about this. 12/08/2014 -- he was seen in the vascular office at [place] and has been scheduled for a angiogram at the end of this month -- next Thursday. I have urged him to keep this appointment. Electronic Signature(s) Signed: 12/22/2014 2:56:04 PM By: Judene Companion MD Entered By: Judene Companion on 12/22/2014 14:56:03 Dietzman, [name] ([id number]) Rinehart, Parcelas Mandry ([id number]) -------------------------------------------------------------------------------- Physical Exam Details Patient Name: [name], [name] Date of Service: 12/22/2014 1:45 PM Medical Record Patient Account Number: [id number] Number: Afful, RN, BSN, Treating RN: Date of Birth/Sex: [birth year] (59 y.o. Male) [facility], Other Clinician: Physician: Ivette Loyal  Treating [name], Physician/Extender: Referring Physician: Peri Maris in Treatment: 5 Electronic Signature(s) Signed: 12/22/2014 2:56:14 PM By: Judene Companion MD Entered By: Judene Companion on 12/22/2014 14:56:14 [name], [name] ([id number]) -------------------------------------------------------------------------------- Physician Orders Details Patient Name: Brue, [name] Date of Service: 12/22/2014 1:45 PM Medical Record Patient Account Number:  [id number] Number: Afful, RN, BSN, Treating RN: Date of Birth/Sex: 10/19/1955 (59 y.o. Male) [facility], Other Clinician: Physician: Ivette Loyal Treating [name] [name], Physician/Extender: Referring Physician: Peri Maris in Treatment: 5 Verbal / Phone Orders: Yes Clinician: Afful, RN, BSN, Rita Read Back and Verified: Yes Diagnosis Coding Wound Cleansing Wound #1 Right,Dorsal Metatarsal head first o Cleanse wound with mild soap and water o May Shower, gently pat wound dry prior to applying new dressing. Wound #2 Right,Dorsal Toe Second o Cleanse wound with mild soap and water o May Shower, gently pat wound dry prior to applying new dressing. Wound #3 Right,Dorsal Toe Third o Cleanse wound with mild soap and water o May Shower, gently pat wound dry prior to applying new dressing. Wound #4 Right,Dorsal Toe Fourth o Cleanse wound with mild soap and water o May Shower, gently pat wound dry prior to applying new dressing. Wound #5 Right,Dorsal Metatarsal head fifth o Cleanse wound with mild soap and water o May Shower, gently pat wound dry prior to applying new dressing. Anesthetic Wound #1 Right,Dorsal Metatarsal head first o Topical Lidocaine 4% cream applied to wound bed prior to debridement Wound #2 Right,Dorsal Toe Second o Topical Lidocaine 4% cream applied to wound bed prior to debridement Wound #3 Right,Dorsal Toe Third o  Topical Lidocaine 4% cream applied to wound bed prior to debridement Wound #4 Right,Dorsal Toe Fourth o Topical Lidocaine 4% cream applied to wound bed prior to debridement Wound #5 Right,Dorsal Metatarsal head fifth [name], [name] ([id number]) o Topical Lidocaine 4% cream applied to wound bed prior to debridement Primary Wound Dressing Wound #1 Right,Dorsal Metatarsal head first o Hydrogel - with silver Wound #2 Right,Dorsal Toe Second o Hydrogel - with silver Wound #3 Right,Dorsal Toe Third o Hydrogel - with silver Wound #4 Right,Dorsal Toe Fourth o Hydrogel - with silver Wound #5 Right,Dorsal Metatarsal head fifth o Hydrogel - with silver Secondary Dressing Wound #1 Right,Dorsal Metatarsal head first o Gauze and Kerlix/Conform Wound #2 Right,Dorsal Toe Second o Gauze and Kerlix/Conform Wound #3 Right,Dorsal Toe Third o Gauze and Kerlix/Conform Wound #4 Right,Dorsal Toe Fourth o Gauze and Kerlix/Conform Wound #5 Right,Dorsal Metatarsal head fifth o Gauze and Kerlix/Conform Dressing Change Frequency Wound #1 Right,Dorsal Metatarsal head first o Change dressing every day. Wound #2 Right,Dorsal Toe Second o Change dressing every day. Wound #3 Right,Dorsal Toe Third o Change dressing every day. Wound #4 Right,Dorsal Toe Fourth o Change dressing every day. Wound #5 Right,Dorsal Metatarsal head fifth [name], [name] ([id number]) o Change dressing every day. Follow-up Appointments Wound #1 Right,Dorsal Metatarsal head first o Return Appointment in 1 week. Wound #2 Right,Dorsal Toe Second o Return Appointment in 1 week. Wound #3 Right,Dorsal Toe Third o Return Appointment in 1 week. Wound #4 Right,Dorsal Toe Fourth o Return Appointment in 1 week. Wound #5 Right,Dorsal Metatarsal head fifth o Return Appointment in 1 week. Notes MAke an appointment with [facility] for necrotic and dry gangrenous 3rd and 4th toes for possible  amputation. Electronic Signature(s) Signed: 12/22/2014 5:24:15 PM By: Regan Lemming BSN, RN Entered By: Regan Lemming on 12/22/2014 15:56:16 [name], [name] ([id number]) -------------------------------------------------------------------------------- Problem List Details Patient Name: [name], [name] Date of Service: 12/22/2014 1:45 PM Medical Record Patient Account Number: [id number] Number: Afful, RN, BSN, Treating RN: Date of Birth/Sex: 02-20-1956 (59 y.o. 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 Date of Service: 12/22/2014 1:45 PM Medical Record Patient Account Number: [id number] Number: Afful, RN, BSN, Treating RN: Date of Birth/Sex: 1955-12-31 (59 y.o. Male) [facility], Other Clinician: Physician: Ivette Loyal Treating [name], Physician/Extender: Referring Physician: Peri Maris in Treatment: 5 Subjective Chief Complaint Information obtained from Patient Patient presents to the wound care center today  with an open arterial ulcer, on his right foot and also severe [name] for several weeks. History of Present Illness (HPI) The following HPI elements were documented for the patient's wound: Location: right foot [name] with ulcers on his forefoot Quality: Patient reports experiencing burning to affected area(s). Severity: Patient states wound are getting worse. Duration: Patient has had the wound for > 3 months prior to seeking treatment at the wound center Timing: [name] in wound is constant (hurts all the time) Context: The wound appeared gradually over time Modifying Factors: Consults to this date include: vascular consult for angioplasty Associated Signs and Symptoms: Patient reports having: difficulty in using his prosthesis and is wheelchair- bound. Seen by Dr. Leotis [name] on 08/29/2014 with ulceration on 2 toes on the right foot and dark discoloration worrisome for gangrenous changes on his 2nd and 3rd toes as well. Given his previous history of below- knee amputation for peripheral disease and ulceration on the left, his multiple atherosclerotic risk factors and his poorly palpable pedal pulses, he was brought in for angiography for further evaluation and potential treatment. He had a percutaneous angioplasty of the right popliteal artery and the right anterior tibial artery and dorsalis pedis artery. He also had angioplasty of the right peroneal artery and the proximal and mid right posterior tibial artery. On 11/07/2014 he's had a duplex of the right lower extremity which showed the ABI was 0.9 and had a multiphasic waveform. Impression was of mild peripheral vascular disease predominantly small vessel disease right ankle. 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 11/22/2014 -- he has been off cigarettes completely since Saturday and is on a nicotine patch and have commended him about this. [name], [name] ([id number]) 12/08/2014 -- he was seen in the vascular office at [facility] and has been scheduled for a angiogram at the end of this month -- next Thursday. I have urged him to keep this appointment. Objective Constitutional Vitals Time Taken: 2:10 PM, Height: 73 in, Weight: 183 lbs, BMI: 24.1, Temperature: 98.6 F, Pulse: 68 bpm, Respiratory Rate: 18 breaths/min, Blood Pressure: 118/68 mmHg. Integumentary (Hair, Skin) Wound #1 status is Open. Original cause of wound was Gradually Appeared. The wound is located on the Right,Dorsal Metatarsal head first. The wound measures 0.5cm length x 1cm width x 0.1cm depth; 0.393cm^2 area and 0.039cm^3 volume. The wound is limited to skin breakdown. There is no tunneling or undermining noted. There is a medium amount of serosanguineous drainage noted. The wound margin is indistinct and nonvisible. There is no granulation within the wound bed.  There is a large (67-100%) amount of necrotic tissue within the wound bed including Eschar and Adherent Slough. The periwound skin appearance exhibited: Dry/Scaly, Moist. The periwound skin appearance did not exhibit: Callus, Crepitus, Excoriation, Fluctuance, Friable, Induration, Localized Edema, Rash, Scarring, Maceration, Atrophie Blanche, Cyanosis, Ecchymosis, Hemosiderin Staining, Mottled, Pallor, Rubor, Erythema. Periwound temperature was noted as No Abnormality. The periwound has tenderness on palpation. Wound #2 status is Open. Original cause of wound was Gradually Appeared. The wound is located on the Right,Dorsal Toe Second. The wound measures 3cm length x 1.4cm width x 0.1cm depth; 3.299cm^2 area and 0.33cm^3 volume. The wound is limited to skin breakdown. There is no tunneling or undermining noted. There is a medium amount of serosanguineous drainage noted. The wound margin is indistinct and nonvisible. There is no granulation within the wound bed. There is a large (67-100%) amount of necrotic tissue  within the wound bed including Eschar and Adherent Slough. The periwound skin appearance exhibited: Dry/Scaly, Moist. The periwound skin appearance did not exhibit: Callus, Crepitus, Excoriation, Fluctuance, Friable, Induration, Localized Edema, Rash, Scarring, Maceration, Atrophie Blanche, Cyanosis, Ecchymosis, Hemosiderin Staining, Mottled, Pallor, Rubor, Erythema. Periwound temperature was noted as No Abnormality. The periwound has tenderness on palpation. Wound #3 status is Open. Original cause of wound was Gradually Appeared. The wound is located on the Right,Dorsal Toe Third. The wound measures 2cm length x 1.5cm width x 0.1cm depth; 2.356cm^2 area and 0.236cm^3 volume. The wound is limited to skin breakdown. There is no tunneling or undermining noted. There is a medium amount of serosanguineous drainage noted. The wound margin is indistinct and nonvisible. There is no granulation within the wound bed. There is a large (67-100%) amount of necrotic tissue within the wound bed including Eschar. The periwound skin appearance exhibited: Dry/Scaly, Moist. The periwound skin appearance did not exhibit: Callus, Crepitus, Excoriation, Fluctuance, Friable, Induration, Localized Edema, Rash, Scarring, Maceration, Atrophie Blanche, Cyanosis, Ecchymosis, Hemosiderin Staining, Mottled, Pallor, Rubor, Erythema. Periwound temperature was noted as No Abnormality. The periwound has tenderness on palpation. [name], [name] ([id number]) Wound #4 status is Open. Original cause of wound was Gradually Appeared. The wound is located on the Right,Dorsal Toe Fourth. The wound measures 2.5cm length x 1cm width x 0.1cm depth; 1.963cm^2 area and 0.196cm^3 volume. The wound is limited to skin breakdown. There is no tunneling or undermining noted. There is a small amount of serosanguineous drainage noted. The wound margin is distinct with the outline attached to the wound base. There is small (1-33%) granulation within  the wound bed. There is a large (67-100%) amount of necrotic tissue within the wound bed including Eschar and Adherent Slough. The periwound skin appearance exhibited: Dry/Scaly, Moist. The periwound skin appearance did not exhibit: Callus, Crepitus, Excoriation, Fluctuance, Friable, Induration, Localized Edema, Rash, Scarring, Maceration, Atrophie Blanche, Cyanosis, Ecchymosis, Hemosiderin Staining, Mottled, Pallor, Rubor, Erythema. Periwound temperature was noted as No Abnormality. The periwound has tenderness on palpation. Wound #5 status is Open. Original cause of wound was Gradually Appeared. The wound is located on the Right,Dorsal Metatarsal head fifth. The wound measures 2.2cm length x 1.5cm width x 0.1cm depth; 2.592cm^2 area and 0.259cm^3 volume. The wound is limited to skin breakdown. There is no tunneling or undermining noted. The wound margin is indistinct and nonvisible. There is no granulation within the wound bed. There is a large (67-100%) amount of necrotic tissue within the wound bed including Eschar and Adherent Slough. The periwound skin appearance exhibited: Dry/Scaly, Moist. The periwound skin appearance did not exhibit: Callus, Crepitus,  Excoriation, Fluctuance, Friable, Induration, Localized Edema, Rash, Scarring, Maceration, Atrophie Blanche, Cyanosis, Ecchymosis, Hemosiderin Staining, Mottled, Pallor, Rubor, Erythema. Periwound temperature was noted as No Abnormality. The periwound has tenderness on palpation. Patient has frank gangrenr of all toes right foot . Diabetic 1 and renal failure. Send to his vascular surgeon. Dress inrin silveralginate Assessment Active Problems ICD-10 E11.621 - Type 2 diabetes mellitus with foot ulcer I70.235 - Atherosclerosis of native arteries of right leg with ulceration of other part of foot N18.6 - End stage renal disease F17.219 - Nicotine dependence, cigarettes, with unspecified nicotine-induced disorders Z89.512 - Acquired  absence of left leg below knee Diagnoses ICD-10 E11.621: Type 2 diabetes mellitus with foot ulcer I70.235: Atherosclerosis of native arteries of right leg with ulceration of other part of foot N18.6: End stage renal disease F17.219: Nicotine dependence, cigarettes, with unspecified nicotine-induced disorders Z89.512: Acquired absence of left leg below knee [name], [name] ([id number]) Plan Wound Cleansing: Wound #1 Right,Dorsal Metatarsal head first: Cleanse wound with mild soap and water May Shower, gently pat wound dry prior to applying new dressing. Wound #2 Right,Dorsal Toe Second: Cleanse wound with mild soap and water May Shower, gently pat wound dry prior to applying new dressing. Wound #3 Right,Dorsal Toe Third: Cleanse wound with mild soap and water May Shower, gently pat wound dry prior to applying new dressing. Wound #4 Right,Dorsal Toe Fourth: Cleanse wound with mild soap and water May Shower, gently pat wound dry prior to applying new dressing. Wound #5 Right,Dorsal Metatarsal head fifth: Cleanse wound with mild soap and water May Shower, gently pat wound dry prior to applying new dressing. Anesthetic: Wound #1 Right,Dorsal Metatarsal head first: Topical Lidocaine 4% cream applied to wound bed prior to debridement Wound #2 Right,Dorsal Toe Second: Topical Lidocaine 4% cream applied to wound bed prior to debridement Wound #3 Right,Dorsal Toe Third: Topical Lidocaine 4% cream applied to wound bed prior to debridement Wound #4 Right,Dorsal Toe Fourth: Topical Lidocaine 4% cream applied to wound bed prior to debridement Wound #5 Right,Dorsal Metatarsal head fifth: Topical Lidocaine 4% cream applied to wound bed prior to debridement Primary Wound Dressing: Wound #1 Right,Dorsal Metatarsal head first: Hydrogel - with silver Wound #2 Right,Dorsal Toe Second: Hydrogel - with silver Wound #3 Right,Dorsal Toe Third: Hydrogel - with silver Wound #4 Right,Dorsal Toe  Fourth: Hydrogel - with silver Wound #5 Right,Dorsal Metatarsal head fifth: Hydrogel - with silver Secondary Dressing: Wound #1 Right,Dorsal Metatarsal head first: Gauze and Kerlix/Conform Wound #2 Right,Dorsal Toe Second: Gauze and Kerlix/Conform Wound #3 Right,Dorsal Toe Third: Gauze and Kerlix/Conform [name], Laksh N. ([id number]) Wound #4 Right,Dorsal Toe Fourth: Gauze and Kerlix/Conform Wound #5 Right,Dorsal Metatarsal head fifth: Gauze and Kerlix/Conform Dressing Change Frequency: Wound #1 Right,Dorsal Metatarsal head first: Change dressing every day. Wound #2 Right,Dorsal Toe Second: Change dressing every day. Wound #3 Right,Dorsal Toe Third: Change dressing every day. Wound #4 Right,Dorsal Toe Fourth: Change dressing every day. Wound #5 Right,Dorsal Metatarsal head fifth: Change dressing every day. Follow-up Appointments: Wound #1 Right,Dorsal Metatarsal head first: Return Appointment in 1 week. Wound #2 Right,Dorsal Toe Second: Return Appointment in 1 week. Wound #3 Right,Dorsal Toe Third: Return Appointment in 1 week. Wound #4 Right,Dorsal Toe Fourth: Return Appointment in 1 week. Wound #5 Right,Dorsal Metatarsal head fifth: Return Appointment in 1 week. General Notes: MAke an appointment with [facility] for necrotic and dry gangrenous 3rd and 4th toes for possible amputation. Electronic Signature(s) Signed: 01/03/2015 4:35:52 PM By: Lorine Bears RCP, RRT, CHT Previous Signature: 12/22/2014  2:59:21 PM Version By: Judene Companion MD Entered By: Lorine Bears on 01/03/2015 16:35:52 Wenke, [name] ([id number]) -------------------------------------------------------------------------------- SuperBill Details Patient Name: Keitt, [name]. Date of Service: 12/22/2014 Medical Record Patient Account Number: [id number] Number: Afful, RN, BSN, Treating RN: Date of Birth/Sex: [birth year] (59 y.o. Male) [facility],  Other Clinician: Physician: Ivette Loyal Treating [name], Physician/Extender: Referring Physician: Peri Maris in Treatment: 5 Diagnosis Coding ICD-10 Codes Code Description E11.621 Type 2 diabetes mellitus with foot ulcer I70.235 Atherosclerosis of native arteries of right leg with ulceration of other part of foot N18.6 End stage renal disease F17.219 Nicotine dependence, cigarettes, with unspecified nicotine-induced disorders Z89.512 Acquired absence of left leg below knee Facility Procedures CPT4 Code: [id number] Description: [id number] - WOUND CARE VISIT-LEV 5 EST PT Modifier: Quantity: 1 Physician Procedures CPT4 Code: [id number] Description: [id number] - WC PHYS LEVEL 2 - EST PT ICD-10 Description Diagnosis E11.621 Type 2 diabetes mellitus with foot ulcer Modifier: Quantity: 1 Electronic Signature(s) Signed: 12/22/2014 2:59:50 PM By: Judene Companion MD Entered By: Judene Companion on 12/22/2014 14:59:50

## 2014-12-23 NOTE — Progress Notes (Signed)
[name] ([id number]) Visit Report for 12/22/2014 Arrival Information Details Patient Name: [name], [name]. Date of Service: 12/22/2014 1:45 PM Medical Record Patient Account Number: [id number] Number: Afful, RN, BSN, Treating RN: Date of Birth/Sex: 03-18-1956 (59 y.o. Male) [facility], Other Clinician: Physician: Bryn Gulling, [place] Referring Physician: Tomasa Hose Physician/Extender: Suella Grove in Treatment: 5 Visit Information History Since Last Visit Any new allergies or adverse reactions: No Patient Arrived: Wheel Chair Had a fall or experienced change in No Arrival Time: 14:09 activities of daily living that may affect Accompanied By: self risk of falls: Transfer Assistance: None Signs or symptoms of abuse/neglect since last No Patient Identification Verified: Yes visito Secondary Verification Process Yes Hospitalized since last visit: No Completed: Has Dressing in Place as Prescribed: Yes Patient Has Alerts: Yes Pain Present Now: No Patient Alerts: Patient on Blood Thinner asa 325 DMII Electronic Signature(s) Signed: 12/22/2014 5:24:15 PM By: Regan Lemming BSN, RN Entered By: Regan Lemming on 12/22/2014 14:27:41 [name] ([id number]) -------------------------------------------------------------------------------- Clinic Level of Care Assessment Details Patient Name: [name], [name] Date of Service: 12/22/2014 1:45 PM Medical Record Patient Account Number: [id number] Number: Afful, RN, BSN, Treating RN: Date of Birth/Sex: 01-21-1956 (59 y.o. Male) [facility], Other Clinician: Physician: Bryn Gulling, [place] Referring Physician: Tomasa Hose Physician/Extender: Suella Grove in Treatment: 5 Clinic Level of Care Assessment Items TOOL 4 Quantity Score []  - Use when only an EandM is performed on FOLLOW-UP visit 0 ASSESSMENTS - Nursing Assessment / Reassessment X -  Reassessment of Co-morbidities (includes updates in patient status) 1 10 X - Reassessment of Adherence to Treatment Plan 1 5 ASSESSMENTS - Wound and Skin Assessment / Reassessment []  - Simple Wound Assessment / Reassessment - one wound 0 X - Complex Wound Assessment / Reassessment - multiple wounds 5 5 []  - Dermatologic / Skin Assessment (not related to wound area) 0 ASSESSMENTS - Focused Assessment []  - Circumferential Edema Measurements - multi extremities 0 []  - Nutritional Assessment / Counseling / Intervention 0 []  - Lower Extremity Assessment (monofilament, tuning fork, pulses) 0 []  - Peripheral Arterial Disease Assessment (using hand held doppler) 0 ASSESSMENTS - Ostomy and/or Continence Assessment and Care []  - Incontinence Assessment and Management 0 []  - Ostomy Care Assessment and Management (repouching, etc.) 0 PROCESS - Coordination of Care X - Simple Patient / Family Education for ongoing care 1 15 []  - Complex (extensive) Patient / Family Education for ongoing care 0 []  - Staff obtains Programmer, systems, Records, Test Results / Process Orders 0 []  - Staff telephones HHA, Nursing Homes / Clarify orders / etc 0 [place], [place]. ([id number]) []  - Routine Transfer to another Facility (non-emergent condition) 0 []  - Routine Hospital Admission (non-emergent condition) 0 []  - New Admissions / Biomedical engineer / Ordering NPWT, Apligraf, etc. 0 []  - Emergency Hospital Admission (emergent condition) 0 X - Simple Discharge Coordination 1 10 []  - Complex (extensive) Discharge Coordination 0 PROCESS - Special Needs []  - Pediatric / Minor Patient Management 0 []  - Isolation Patient Management 0 []  - Hearing / Language / Visual special needs 0 []  - Assessment of Community assistance (transportation, D/C planning, etc.) 0 []  - Additional assistance / Altered mentation 0 []  - Support Surface(s) Assessment (bed, cushion, seat, etc.) 0 INTERVENTIONS - Wound Cleansing / Measurement []  -  Simple Wound Cleansing - one wound 0 X - Complex Wound Cleansing - multiple wounds 5 5 X - Wound Imaging (photographs - any number of wounds)  1 5 []  - Wound Tracing (instead of photographs) 0 []  - Simple Wound Measurement - one wound 0 X - Complex Wound Measurement - multiple wounds 5 5 INTERVENTIONS - Wound Dressings X - Small Wound Dressing one or multiple wounds 5 10 []  - Medium Wound Dressing one or multiple wounds 0 []  - Large Wound Dressing one or multiple wounds 0 []  - Application of Medications - topical 0 []  - Application of Medications - injection 0 [name], [name] ([id number]) INTERVENTIONS - Miscellaneous []  - External ear exam 0 []  - Specimen Collection (cultures, biopsies, blood, body fluids, etc.) 0 []  - Specimen(s) / Culture(s) sent or taken to Lab for analysis 0 []  - Patient Transfer (multiple staff / Harrel Lemon Lift / Similar devices) 0 []  - Simple Staple / Suture removal (25 or less) 0 []  - Complex Staple / Suture removal (26 or more) 0 []  - Hypo / Hyperglycemic Management (close monitor of Blood Glucose) 0 []  - Ankle / Brachial Index (ABI) - do not check if billed separately 0 X - Vital Signs 1 5 Has the patient been seen at the hospital within the last three years: Yes Total Score: 175 Level Of Care: New/Established - Level 5 Electronic Signature(s) Signed: 12/22/2014 5:24:15 PM By: Regan Lemming BSN, RN Entered By: Regan Lemming on 12/22/2014 14:37:35 [name] ([id number]) -------------------------------------------------------------------------------- Encounter Discharge Information Details Patient Name: [name], [name] Date of Service: 12/22/2014 1:45 PM Medical Record Patient Account Number: [id number] Number: Afful, RN, BSN, Treating RN: Date of Birth/Sex: [birth year] (59 y.o. Male) [facility], Other Clinician: Physician: Bryn Gulling, [place] Referring Physician: Tomasa Hose Physician/Extender: Suella Grove in  Treatment: 5 Encounter Discharge Information Items Discharge Pain Level: 0 Discharge Condition: Stable Ambulatory Status: Ambulatory Nursing Discharge Destination: Home Transportation: Other Accompanied By: self Schedule Follow-up Appointment: No Medication Reconciliation completed No and provided to Patient/Care Maneh Sieben: Clinical Summary of Care: Electronic Signature(s) Signed: 12/22/2014 5:24:15 PM By: Regan Lemming BSN, RN Previous Signature: 12/22/2014 3:00:26 PM Version By: Judene Companion MD Entered By: Regan Lemming on 12/22/2014 15:57:44 Boze, Angelyn [name] ([id number]) -------------------------------------------------------------------------------- Lower Extremity Assessment Details Patient Name: [name], [name] Date of Service: 12/22/2014 1:45 PM Medical Record Patient Account Number: [id number] Number: Afful, RN, BSN, Treating RN: Date of Birth/Sex: Oct 20, 1955 (59 y.o. Male) [facility], Other Clinician: Physician: Theodis Sato Referring Physician: Tomasa Hose Physician/Extender: Weeks in Treatment: 5 Vascular Assessment Pulses: Posterior Tibial Dorsalis Pedis Palpable: [Right:Yes] Extremity colors, hair growth, and conditions: Extremity Color: [Right:Normal] Hair Growth on Extremity: [Right:No] Temperature of Extremity: [Right:Warm] Capillary Refill: [Right:< 3 seconds] Electronic Signature(s) Signed: 12/22/2014 5:24:15 PM By: Regan Lemming BSN, RN Entered By: Regan Lemming on 12/22/2014 14:28:06 Ohlson, Angelyn [name] ([id number]) -------------------------------------------------------------------------------- Multi Wound Chart Details Patient Name: [name], [name] Date of Service: 12/22/2014 1:45 PM Medical Record Patient Account Number: [id number] Number: Afful, RN, BSN, Treating RN: Date of Birth/Sex: 10/26/1955 (59 y.o. 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] Comorbid History: [1:Anemia, Asthma, Coronary Artery Disease,  Coronary Artery Disease, Coronary Artery Disease, Hypertension, Type II Diabetes, End Stage Renal Disease, Neuropathy] [2:Anemia, Asthma, Hypertension, Type II Diabetes, End Stage Renal  Disease, Neuropathy] [3:Anemia, Asthma, Hypertension, Type II Diabetes, End Stage Renal Disease, Neuropathy] Date Acquired: [1:09/14/2014] [2:09/14/2014] [3:09/14/2014] Weeks of Treatment: [1:5] [2:5] [3:5] Wound Status: [1:Open] [2:Open] [3:Open] Measurements L x W x D 0.5x1x0.1 [2:3x1.4x0.1] [3:2x1.5x0.1] (cm) Area (cm) : [1:0.393] [2:3.299] [3:2.356] Volume (cm) : [1:0.039] [2:0.33] [3:0.236] % Reduction in Area: [1:50.90%] [2:20.00%] [3:33.30%] % Reduction in Volume: 51.30% [2:19.90%] [3:33.10%] Classification: [1:Partial Thickness] [2:Partial Thickness] [3:Partial Thickness] HBO Classification: [1:Grade 1] [2:Grade 1] [3:Grade 1] Exudate Amount: [1:Medium] [2:Medium] [3:Medium] Exudate Type: [1:Serosanguineous] [2:Serosanguineous] [3:Serosanguineous] Exudate Color: [1:red, brown] [2:red, brown] [3:red, brown] Foul Odor After [1:Yes] [2:Yes] [3:Yes] Cleansing: [1:No] [2:No] [3:No] Odor Anticipated Due to Product Use: Wound Margin: Indistinct, nonvisible Indistinct, nonvisible Indistinct, nonvisible Granulation  Amount: None Present (0%) None Present (0%) None Present (0%) Necrotic Amount: Large (67-100%) Large (67-100%) Large (67-100%) Necrotic Tissue: Eschar, Adherent Slough Eschar, Adherent Slough Eschar Exposed Structures: Fascia: No Fascia: No Fascia: No Fat: No Fat: No Fat: No Tendon: No Tendon: No Tendon: No Muscle: No Muscle: No Muscle: No Joint: No Joint: No Joint: No Bone: No Bone: No Bone: No Limited to Skin Limited to Skin Limited to Skin Breakdown Breakdown Breakdown Epithelialization: None None None Periwound Skin Texture: Edema: No Edema: No Edema: No Excoriation: No Excoriation: No Excoriation: No Induration: No Induration: No Induration: No Callus: No Callus: No Callus: No Crepitus: No Crepitus: No Crepitus: No Fluctuance: No Fluctuance: No Fluctuance: No Friable: No Friable: No Friable: No Rash: No Rash: No Rash: No Scarring: No Scarring: No Scarring: No Periwound Skin Moist: Yes Moist: Yes Moist: Yes Moisture: Dry/Scaly: Yes Dry/Scaly: Yes Dry/Scaly: Yes Maceration: No Maceration: No Maceration: No Periwound Skin Color: Atrophie Blanche: No Atrophie Blanche: No Atrophie Blanche: No Cyanosis: No Cyanosis: No Cyanosis: No Ecchymosis: No Ecchymosis: No Ecchymosis: No Erythema: No Erythema: No Erythema: No Hemosiderin Staining: No Hemosiderin Staining: No Hemosiderin Staining: No Mottled: No Mottled: No Mottled: No Pallor: No Pallor: No Pallor: No Rubor: No Rubor: No Rubor: No Temperature: No Abnormality No Abnormality No Abnormality Tenderness on Yes Yes Yes Palpation: Wound Preparation: Ulcer Cleansing: Ulcer Cleansing: Ulcer Cleansing: Rinsed/Irrigated with Rinsed/Irrigated with Rinsed/Irrigated with Saline Saline Saline Topical Anesthetic Topical Anesthetic Topical Anesthetic Applied: Other: lidocaine Applied: Other: lidocaine Applied: Other: lidocaine 4% 4% 4% Wound Number: 4 5 N/A Photos: No Photos No Photos  N/A Wound Location: Right Toe Fourth - Dorsal Right Metatarsal head fifth N/A - Dorsal Kuhlmann, [name] ([id number]) Wounding Event: Gradually Appeared Gradually Appeared N/A 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 N/A Comorbid History: Anemia, Asthma, Anemia, Asthma, N/A Coronary Artery Disease, Coronary Artery Disease, Hypertension, Type II Hypertension, Type II Diabetes, End Stage Diabetes, End Stage Renal Disease, Renal Disease, Neuropathy Neuropathy Date Acquired: 09/14/2014 09/14/2014 N/A Weeks of Treatment: 5 5 N/A Wound Status: Open Open N/A Measurements L x W x D 2.5x1x0.1 2.2x1.5x0.1 N/A (cm) Area (cm) : 1.963 2.592 N/A Volume (cm) : 0.196 0.259 N/A % Reduction in Area: -177.70% -3.10% N/A % Reduction in Volume: -176.10% -3.20% N/A Classification: Partial Thickness Partial Thickness N/A HBO Classification: Grade 1 Grade 1 N/A Exudate Amount: Small N/A N/A Exudate Type: Serosanguineous N/A N/A Exudate Color: red, brown N/A N/A Foul Odor After No No N/A Cleansing: Odor Anticipated Due to N/A N/A N/A Product Use: Wound Margin: Distinct, outline attached Indistinct, nonvisible N/A Granulation Amount: Small (1-33%) None  Present (0%) N/A Necrotic Amount: Large (67-100%) Large (67-100%) N/A Necrotic Tissue: Eschar, Adherent Slough Eschar, Adherent Slough N/A Exposed Structures: Fascia: No Fascia: No N/A Fat: No Fat: No Tendon: No Tendon: No Muscle: No Muscle: No Joint: No Joint: No Bone: No Bone: No Limited to Skin Limited to Skin Breakdown Breakdown Epithelialization: None None N/A Periwound Skin Texture: Edema: No Edema: No N/A Excoriation: No Excoriation: No Induration: No Induration: No Callus: No Callus: No Crepitus: No Crepitus: No Fluctuance: No Fluctuance: No Friable: No Friable: No Rash: No Rash: No Scarring: No Scarring: No Periwound Skin N/A Moisture: [name], [name] ([id number]) Moist:  Yes Moist: Yes Dry/Scaly: Yes Dry/Scaly: Yes Maceration: No Maceration: No Periwound Skin Color: Atrophie Blanche: No Atrophie Blanche: No N/A Cyanosis: No Cyanosis: No Ecchymosis: No Ecchymosis: No Erythema: No Erythema: No Hemosiderin Staining: No Hemosiderin Staining: No Mottled: No Mottled: No Pallor: No Pallor: No Rubor: No Rubor: No Temperature: No Abnormality No Abnormality N/A Tenderness on Yes Yes N/A Palpation: Wound Preparation: Ulcer Cleansing: Ulcer Cleansing: N/A Rinsed/Irrigated with Rinsed/Irrigated with Saline Saline Topical Anesthetic Topical Anesthetic Applied: Other: lidocaine Applied: Other: lidocaine 4% 4% Treatment Notes Electronic Signature(s) Signed: 12/22/2014 5:24:15 PM By: Regan Lemming BSN, RN Entered By: Regan Lemming on 12/22/2014 14:27:28 Mcfall, Angelyn [name] ([id number]) -------------------------------------------------------------------------------- Reed Details Patient Name: Raether, Tanuj N. Date of Service: 12/22/2014 1:45 PM Medical Record Patient Account Number: [id number] Number: Afful, RN, BSN, Treating RN: Date of Birth/Sex: May 01, 1956 (59 y.o. Male) [facility], Other Clinician: Physician: Bryn Gulling, [place] Referring Physician: Tomasa Hose Physician/Extender: Suella Grove in Treatment: 5 Active Inactive Abuse / Safety / Falls / Self Care Management Nursing Diagnoses: Potential for falls Goals: Patient will remain injury free Date Initiated: 11/17/2014 Goal Status: Active Interventions: Assess fall risk on admission and as needed Notes: Orientation to the Wound Care Program Nursing Diagnoses: Knowledge deficit related to the wound healing center program Goals: Patient/caregiver will verbalize understanding of the [place] Program Date Initiated: 11/17/2014 Goal Status: Active Interventions: Provide education on orientation to the wound  center Notes: Wound/Skin Impairment Nursing Diagnoses: Knowledge deficit related to smoking impact on wound healing Goals: Cabeza, Tory Delane [name] ([id number]) Patient will demonstrate a reduced rate of smoking or cessation of smoking Date Initiated: 11/17/2014 Goal Status: Active Ulcer/skin breakdown will have a volume reduction of 30% by week 4 Date Initiated: 11/17/2014 Goal Status: Active Interventions: Assess ulceration(s) every visit Provide education on smoking Notes: Electronic Signature(s) Signed: 12/22/2014 5:24:15 PM By: Regan Lemming BSN, RN Entered By: Regan Lemming on 12/22/2014 14:26:10 Stum, Angelyn [name] ([id number]) -------------------------------------------------------------------------------- Pain Assessment Details Patient Name: Dooling, Lavontae N. Date of Service: 12/22/2014 1:45 PM Medical Record Patient Account Number: [id number] Number: Afful, RN, BSN, Treating RN: Date of Birth/Sex: Jul 21, 1955 (59 y.o. Male) [facility], Other Clinician: Physician: Theodis Sato Referring Physician: Tomasa Hose Physician/Extender: Suella Grove in Treatment: 5 Active Problems Location of Pain Severity and Description of Pain Patient Has Paino No Site Locations Pain Management and Medication Current Pain Management: Electronic Signature(s) Signed: 12/22/2014 5:24:15 PM By: Regan Lemming BSN, RN Entered By: Regan Lemming on 12/22/2014 14:27:50 Matters, Angelyn [name] ([id number]) -------------------------------------------------------------------------------- Patient/Caregiver Education Details Patient Name: [name], [name] Date of Service: 12/22/2014 1:45 PM Medical Record Patient Account Number: [id number] Number: Afful, RN, BSN, Treating RN: Date of Birth/Gender: 03-21-1956 (59 y.o. Male) [facility], Other Clinician: Physician: Theodis Sato Referring Physician: Tomasa Hose Physician/Extender: Suella Grove in Treatment:  5 Education Assessment Education Provided To: Patient Education Topics Provided Smoking and Wound Healing: Methods: Explain/Verbal Responses: State content correctly Welcome To [facility] [place]: Methods: Explain/Verbal Responses: State content correctly Electronic Signature(s) Signed: 12/22/2014 5:24:15 PM By: Regan Lemming BSN, RN Previous Signature: 12/22/2014 3:00:36 PM Version By: Judene Companion MD Entered By: Regan Lemming on 12/22/2014 15:57:58 Houdek, Angelyn [name] ([id number]) -------------------------------------------------------------------------------- Wound Assessment Details Patient Name: [name], [name] Date of Service: 12/22/2014 1:45 PM Medical Record Patient Account Number: [id number] Number: Afful, RN, BSN, Treating RN: Date of Birth/Sex: [birth year] (60 y.o. Male) [facility], Other Clinician: Physician: Johnanna Schneiders, Errol Referring Physician: Tomasa Hose Physician/Extender: Weeks in Treatment: 5 Wound Status Wound Number: 1 Primary Arterial Insufficiency Ulcer Etiology: Wound Location: Right Metatarsal head first - Dorsal Wound Open Status: Wounding Event: Gradually Appeared Comorbid Anemia, Asthma, Coronary Artery Date Acquired: 09/14/2014 History: Disease, Hypertension, Type II Weeks Of Treatment: 5 Diabetes, End Stage Renal Disease, Clustered Wound: No Neuropathy Photos Photo Uploaded By: Regan Lemming on 12/22/2014 17:16:09 Wound Measurements Length: (cm) 0.5 Width: (cm) 1 Depth: (cm) 0.1 Area: (cm) 0.393 Volume: (cm) 0.039 % Reduction in Area: 50.9% % Reduction in Volume: 51.3% Epithelialization: None Tunneling: No Undermining: No Wound Description Classification: Partial Thickness Foul Odor Diabetic Severity (Wagner): Grade 1 Due to Pro Wound Margin: Indistinct, nonvisible Exudate Amount: Medium Exudate Type: Serosanguineous Exudate Color: red,  brown After Cleansing: Yes duct Use: No Wound Bed [name], [name] ([id number]) Granulation Amount: None Present (0%) Exposed Structure Necrotic Amount: Large (67-100%) Fascia Exposed: No Necrotic Quality: Eschar, Adherent Slough Fat Layer Exposed: No Tendon Exposed: No Muscle Exposed: No Joint Exposed: No Bone Exposed: No Limited to Skin Breakdown Periwound Skin Texture Texture Color No Abnormalities Noted: No No Abnormalities Noted: No Callus: No Atrophie Blanche: No Crepitus: No Cyanosis: No Excoriation: No Ecchymosis: No Fluctuance: No Erythema: No Friable: No Hemosiderin Staining: No Induration: No Mottled: No Localized Edema: No Pallor: No Rash: No Rubor: No Scarring: No Temperature / Pain Moisture Temperature: No Abnormality No Abnormalities Noted: No Tenderness on Palpation: Yes Dry / Scaly: Yes Maceration: No Moist: Yes Wound Preparation Ulcer Cleansing: Rinsed/Irrigated with Saline Topical Anesthetic Applied: Other: lidocaine 4%, Treatment Notes Wound #1 (Right, Dorsal Metatarsal head first) 1. Cleansed with: Clean wound with Normal Saline 4. Dressing Applied: Hydrogel 5. Secondary Dressing Applied Gauze and Kerlix/Conform 7. Secured with Tape Notes hydrogel with silver gauze wrapped in between toes. Electronic Signature(s) EAGAN, BAERGA ([id number]) Signed: 12/22/2014 5:24:15 PM By: Regan Lemming BSN, RN Entered By: Regan Lemming on 12/22/2014 14:22:40 Feild, Angelyn [name] ([id number]) -------------------------------------------------------------------------------- Wound Assessment Details Patient Name: [name], [name] Date of Service: 12/22/2014 1:45 PM Medical Record Patient Account Number: [id number] Number: Afful, RN, BSN, Treating RN: Date of Birth/Sex: [birth year] (59 y.o. Male) [facility], Other Clinician: Physician: Johnanna Schneiders, [place] Referring Physician: Tomasa Hose Physician/Extender: Weeks in Treatment: 5 Wound Status Wound Number: 2 Primary Arterial Insufficiency Ulcer Etiology: Wound Location: Right Toe Second - Dorsal Wound Open Wounding Event: Gradually Appeared Status: Date Acquired: 09/14/2014 Comorbid Anemia, Asthma, Coronary Artery Weeks Of Treatment: 5 History: Disease, Hypertension, Type II Clustered Wound: No Diabetes, End Stage Renal Disease, Neuropathy Photos Photo Uploaded By: Regan Lemming on 12/22/2014 17:16:10 Wound Measurements Length: (cm) 3 Width: (cm) 1.4 Depth: (cm) 0.1 Area: (cm) 3.299 Volume: (cm) 0.33 % Reduction in Area: 20% % Reduction in Volume: 19.9% Epithelialization: None Tunneling: No Undermining: No Wound Description Classification: Partial Thickness Foul  Odor Diabetic Severity Earleen Newport): Grade 1 Due to Pro Wound Margin: Indistinct, nonvisible Exudate Amount: Medium Exudate Type: Serosanguineous Exudate Color: red, brown After Cleansing: Yes duct Use: No Wound Bed Beckum, Cassell N. ([id number]) Granulation Amount: None Present (0%) Exposed Structure Necrotic Amount: Large (67-100%) Fascia Exposed: No Necrotic Quality: Eschar, Adherent Slough Fat Layer Exposed: No Tendon Exposed: No Muscle Exposed: No Joint Exposed: No Bone Exposed: No Limited to Skin Breakdown Periwound Skin Texture Texture Color No Abnormalities Noted: No No Abnormalities Noted: No Callus: No Atrophie Blanche: No Crepitus: No Cyanosis: No Excoriation: No Ecchymosis: No Fluctuance: No Erythema: No Friable: No Hemosiderin Staining: No Induration: No Mottled: No Localized Edema: No Pallor: No Rash: No Rubor: No Scarring: No Temperature / Pain Moisture Temperature: No Abnormality No Abnormalities Noted: No Tenderness on Palpation: Yes Dry / Scaly: Yes Maceration: No Moist: Yes Wound Preparation Ulcer Cleansing: Rinsed/Irrigated with Saline Topical Anesthetic Applied: Other: lidocaine  4%, Treatment Notes Wound #2 (Right, Dorsal Toe Second) 1. Cleansed with: Clean wound with Normal Saline 4. Dressing Applied: Hydrogel 5. Secondary Dressing Applied Gauze and Kerlix/Conform 7. Secured with Tape Notes hydrogel with silver gauze wrapped in between toes. Electronic Signature(s) BLAIK, YOOS ([id number]) Signed: 12/22/2014 5:24:15 PM By: Regan Lemming BSN, RN Entered By: Regan Lemming on 12/22/2014 14:23:25 Hottenstein, Angelyn [name] ([id number]) -------------------------------------------------------------------------------- Wound Assessment Details Patient Name: [name], [name] Date of Service: 12/22/2014 1:45 PM Medical Record Patient Account Number: [id number] Number: Afful, RN, BSN, Treating RN: Date of Birth/Sex: May 27, 1956 (59 y.o. Male) [facility], Other Clinician: Physician: Ivette Loyal Treating Britto, Errol Referring Physician: Tomasa Hose Physician/Extender: Weeks in Treatment: 5 Wound Status Wound Number: 3 Primary Arterial Insufficiency Ulcer Etiology: Wound Location: Right Toe Third - Dorsal Wound Open Wounding Event: Gradually Appeared Status: Date Acquired: 09/14/2014 Comorbid Anemia, Asthma, Coronary Artery Weeks Of Treatment: 5 History: Disease, Hypertension, Type II Clustered Wound: No Diabetes, End Stage Renal Disease, Neuropathy Photos Photo Uploaded By: Regan Lemming on 12/22/2014 17:16:10 Wound Measurements Length: (cm) 2 Width: (cm) 1.5 Depth: (cm) 0.1 Area: (cm) 2.356 Volume: (cm) 0.236 % Reduction in Area: 33.3% % Reduction in Volume: 33.1% Epithelialization: None Tunneling: No Undermining: No Wound Description Classification: Partial Thickness Foul Odor Diabetic Severity (Wagner): Grade 1 Due to Pro Wound Margin: Indistinct, nonvisible Exudate Amount: Medium Exudate Type: Serosanguineous Exudate Color: red, brown After Cleansing: Yes duct Use: No Wound Bed Murty, Kyheem N.  ([id number]) Granulation Amount: None Present (0%) Exposed Structure Necrotic Amount: Large (67-100%) Fascia Exposed: No Necrotic Quality: Eschar Fat Layer Exposed: No Tendon Exposed: No Muscle Exposed: No Joint Exposed: No Bone Exposed: No Limited to Skin Breakdown Periwound Skin Texture Texture Color No Abnormalities Noted: No No Abnormalities Noted: No Callus: No Atrophie Blanche: No Crepitus: No Cyanosis: No Excoriation: No Ecchymosis: No Fluctuance: No Erythema: No Friable: No Hemosiderin Staining: No Induration: No Mottled: No Localized Edema: No Pallor: No Rash: No Rubor: No Scarring: No Temperature / Pain Moisture Temperature: No Abnormality No Abnormalities Noted: No Tenderness on Palpation: Yes Dry / Scaly: Yes Maceration: No Moist: Yes Wound Preparation Ulcer Cleansing: Rinsed/Irrigated with Saline Topical Anesthetic Applied: Other: lidocaine 4%, Treatment Notes Wound #3 (Right, Dorsal Toe Third) 1. Cleansed with: Clean wound with Normal Saline 4. Dressing Applied: Hydrogel 5. Secondary Dressing Applied Gauze and Kerlix/Conform 7. Secured with Tape Notes hydrogel with silver gauze wrapped in between toes. Electronic Signature(s) SEANPAUL, MCGHIE ([id number]) Signed: 12/22/2014 5:24:15 PM By: Regan Lemming BSN, RN Entered By: Regan Lemming on 12/22/2014  14:24:06 Halbig, REINER ROBICHEAU ([id number]) -------------------------------------------------------------------------------- Wound Assessment Details Patient Name: Biswas, [name] LEEMAN. Date of Service: 12/22/2014 1:45 PM Medical Record Patient Account Number: [id number] Number: Afful, RN, BSN, Treating RN: Date of Birth/Sex: 10-26-1955 (60 y.o. Male) [facility], Other Clinician: Physician: Johnanna Schneiders, Errol Referring Physician: Tomasa Hose Physician/Extender: Weeks in Treatment: 5 Wound Status Wound Number: 4 Primary Arterial Insufficiency  Ulcer Etiology: Wound Location: Right Toe Fourth - Dorsal Wound Open Wounding Event: Gradually Appeared Status: Date Acquired: 09/14/2014 Comorbid Anemia, Asthma, Coronary Artery Weeks Of Treatment: 5 History: Disease, Hypertension, Type II Clustered Wound: No Diabetes, End Stage Renal Disease, Neuropathy Photos Photo Uploaded By: Regan Lemming on 12/22/2014 17:16:57 Wound Measurements Length: (cm) 2.5 Width: (cm) 1 Depth: (cm) 0.1 Area: (cm) 1.963 Volume: (cm) 0.196 % Reduction in Area: -177.7% % Reduction in Volume: -176.1% Epithelialization: None Tunneling: No Undermining: No Wound Description Classification: Partial Thickness Diabetic Severity (Wagner): Grade 1 Wound Margin: Distinct, outline attached Exudate Amount: Small Exudate Type: Serosanguineous Exudate Color: red, brown Foul Odor After Cleansing: No Wound Bed Calandra, Ebony N. ([id number]) Granulation Amount: Small (1-33%) Exposed Structure Necrotic Amount: Large (67-100%) Fascia Exposed: No Necrotic Quality: Eschar, Adherent Slough Fat Layer Exposed: No Tendon Exposed: No Muscle Exposed: No Joint Exposed: No Bone Exposed: No Limited to Skin Breakdown Periwound Skin Texture Texture Color No Abnormalities Noted: No No Abnormalities Noted: No Callus: No Atrophie Blanche: No Crepitus: No Cyanosis: No Excoriation: No Ecchymosis: No Fluctuance: No Erythema: No Friable: No Hemosiderin Staining: No Induration: No Mottled: No Localized Edema: No Pallor: No Rash: No Rubor: No Scarring: No Temperature / Pain Moisture Temperature: No Abnormality No Abnormalities Noted: No Tenderness on Palpation: Yes Dry / Scaly: Yes Maceration: No Moist: Yes Wound Preparation Ulcer Cleansing: Rinsed/Irrigated with Saline Topical Anesthetic Applied: Other: lidocaine 4%, Treatment Notes Wound #4 (Right, Dorsal Toe Fourth) 1. Cleansed with: Clean wound with Normal Saline 4. Dressing  Applied: Hydrogel 5. Secondary Dressing Applied Gauze and Kerlix/Conform 7. Secured with Tape Notes hydrogel with silver gauze wrapped in between toes. Electronic Signature(s) VATSAL, ORAVEC ([id number]) Signed: 12/22/2014 5:24:15 PM By: Regan Lemming BSN, RN Entered By: Regan Lemming on 12/22/2014 14:25:49 Demars, Angelyn [name] ([id number]) -------------------------------------------------------------------------------- Wound Assessment Details Patient Name: Zeisler, Jacson N. Date of Service: 12/22/2014 1:45 PM Medical Record Patient Account Number: [id number] Number: Afful, RN, BSN, Treating RN: Date of Birth/Sex: [birth year] (59 y.o. Male) [facility], Other Clinician: Physician: Johnanna Schneiders, Errol Referring Physician: Tomasa Hose Physician/Extender: Weeks in Treatment: 5 Wound Status Wound Number: 5 Primary Arterial Insufficiency Ulcer Etiology: Wound Location: Right Metatarsal head fifth - Dorsal Wound Open Status: Wounding Event: Gradually Appeared Comorbid Anemia, Asthma, Coronary Artery Date Acquired: 09/14/2014 History: Disease, Hypertension, Type II Weeks Of Treatment: 5 Diabetes, End Stage Renal Disease, Clustered Wound: No Neuropathy Photos Photo Uploaded By: Regan Lemming on 12/22/2014 17:16:57 Wound Measurements Length: (cm) 2.2 Width: (cm) 1.5 Depth: (cm) 0.1 Area: (cm) 2.592 Volume: (cm) 0.259 % Reduction in Area: -3.1% % Reduction in Volume: -3.2% Epithelialization: None Tunneling: No Undermining: No Wound Description Classification: Partial Thickness Diabetic Severity Earleen Newport): Grade 1 Wound Margin: Indistinct, nonvisible Foul Odor After Cleansing: No Wound Bed Granulation Amount: None Present (0%) Exposed Structure Necrotic Amount: Large (67-100%) Fascia Exposed: No Necrotic Quality: Eschar, Adherent Slough Fat Layer Exposed: No Witting, [place] ([id number]) Tendon Exposed: No Muscle Exposed:  No Joint Exposed: No Bone Exposed: No Limited to Skin Breakdown Periwound Skin Texture Texture Color No Abnormalities  Noted: No No Abnormalities Noted: No Callus: No Atrophie Blanche: No Crepitus: No Cyanosis: No Excoriation: No Ecchymosis: No Fluctuance: No Erythema: No Friable: No Hemosiderin Staining: No Induration: No Mottled: No Localized Edema: No Pallor: No Rash: No Rubor: No Scarring: No Temperature / Pain Moisture Temperature: No Abnormality No Abnormalities Noted: No Tenderness on Palpation: Yes Dry / Scaly: Yes Maceration: No Moist: Yes Wound Preparation Ulcer Cleansing: Rinsed/Irrigated with Saline Topical Anesthetic Applied: Other: lidocaine 4%, Treatment Notes Wound #5 (Right, Dorsal Metatarsal head fifth) 1. Cleansed with: Clean wound with Normal Saline 4. Dressing Applied: Hydrogel 5. Secondary Dressing Applied Gauze and Kerlix/Conform 7. Secured with Tape Notes hydrogel with silver gauze wrapped in between toes. Electronic Signature(s) Signed: 12/22/2014 5:24:15 PM By: Regan Lemming BSN, RN Entered By: Regan Lemming on 12/22/2014 14:24:51 Helseth, KENTEN FREIRE ([id number]) Fells, [place] ([id number]) -------------------------------------------------------------------------------- Vitals Details Patient Name: Clasby, Angelyn [name]. Date of Service: 12/22/2014 1:45 PM Medical Record Patient Account Number: [id number] Number: Afful, RN, BSN, Treating RN: Date of Birth/Sex: [birth year] (59 y.o. Male) [facility], Other Clinician: Physician: Bryn Gulling, [place] Referring Physician: Tomasa Hose Physician/Extender: Weeks in Treatment: 5 Vital Signs Time Taken: 14:10 Temperature (F): 98.6 Height (in): 73 Pulse (bpm): 68 Weight (lbs): 183 Respiratory Rate (breaths/min): 18 Body Mass Index (BMI): 24.1 Blood Pressure (mmHg): 118/68 Reference Range: 80 - 120 mg / dl Electronic Signature(s) Signed:  12/22/2014 5:24:15 PM By: Regan Lemming BSN, RN Entered By: Regan Lemming on 12/22/2014 14:27:55

## 2014-12-29 ENCOUNTER — Ambulatory Visit: Payer: Medicare Other | Admitting: Surgery

## 2014-12-30 ENCOUNTER — Encounter: Payer: Medicare Other | Attending: Surgery | Admitting: Surgery

## 2014-12-30 DIAGNOSIS — I70261 Atherosclerosis of native arteries of extremities with gangrene, right leg: Secondary | ICD-10-CM | POA: Insufficient documentation

## 2014-12-30 DIAGNOSIS — Z992 Dependence on renal dialysis: Secondary | ICD-10-CM | POA: Diagnosis not present

## 2014-12-30 DIAGNOSIS — N186 End stage renal disease: Secondary | ICD-10-CM | POA: Insufficient documentation

## 2014-12-30 DIAGNOSIS — Z89512 Acquired absence of left leg below knee: Secondary | ICD-10-CM | POA: Diagnosis not present

## 2014-12-30 DIAGNOSIS — L97519 Non-pressure chronic ulcer of other part of right foot with unspecified severity: Secondary | ICD-10-CM | POA: Diagnosis present

## 2014-12-30 DIAGNOSIS — E11621 Type 2 diabetes mellitus with foot ulcer: Secondary | ICD-10-CM | POA: Insufficient documentation

## 2014-12-30 DIAGNOSIS — F17219 Nicotine dependence, cigarettes, with unspecified nicotine-induced disorders: Secondary | ICD-10-CM | POA: Insufficient documentation

## 2014-12-30 DIAGNOSIS — I70235 Atherosclerosis of native arteries of right leg with ulceration of other part of foot: Secondary | ICD-10-CM | POA: Insufficient documentation

## 2014-12-31 NOTE — Progress Notes (Addendum)
CATO, WHITAKER ([id number]) Visit Report for 12/30/2014 Arrival Information Details Patient Name: [name], [name] 12/30/2014 9:30 Date of Service: AM Medical Record [id number] Number: Patient Account Number: [id number] Date of Birth/Sex: 1956/02/15 (58 y.o. Male) Treating RN: Dorthy, Lavella Hammock, Other Clinician: Primary Care Physician: Ivette Loyal Treating Britto, Minerva Areola, Physician/Extender: Referring Physician: Peri Maris in Treatment: 6 Visit Information History Since Last Visit Added or deleted any medications: No Patient Arrived: Wheel Chair Any new allergies or adverse reactions: No Arrival Time: 09:25 Had a fall or experienced change in No Accompanied By: sister activities of daily living that may affect Transfer Assistance: None risk of falls: Patient Identification Verified: Yes Signs or symptoms of abuse/neglect since last No Secondary Verification Process Yes visito Completed: Hospitalized since last visit: No Patient Has Alerts: Yes Pain Present Now: Yes Patient Alerts: Patient on Blood Thinner asa 325 DMII Electronic Signature(s) Signed: 12/30/2014 4:34:27 PM By: Montey Hora Entered By: Montey Hora on 12/30/2014 09:26:04 Leeper, [name] ([id number]) -------------------------------------------------------------------------------- Clinic Level of Care Assessment Details Patient Name: [name], [name] 12/30/2014 9:30 Date of Service: AM Medical Record [id number] Number: Patient Account Number: [id number] Date of Birth/Sex: 1955/12/13 (58 y.o. Male) Treating RN: Laurin Coder, Other Clinician: Primary Care Physician: Ivette Loyal Treating Britto, Minerva Areola, Physician/Extender: Referring Physician: Peri Maris in Treatment: 6 Clinic Level of Care Assessment Items TOOL 4 Quantity Score []  - Use when only an EandM is performed on FOLLOW-UP visit 0 ASSESSMENTS - Nursing Assessment / Reassessment []  -  Reassessment of Co-morbidities (includes updates in patient status) 0 X - Reassessment of Adherence to Treatment Plan 1 5 ASSESSMENTS - Wound and Skin Assessment / Reassessment []  - Simple Wound Assessment / Reassessment - one wound 0 X - Complex Wound Assessment / Reassessment - multiple wounds 1 5 []  - Dermatologic / Skin Assessment (not related to wound area) 0 ASSESSMENTS - Focused Assessment []  - Circumferential Edema Measurements - multi extremities 0 []  - Nutritional Assessment / Counseling / Intervention 0 []  - Lower Extremity Assessment (monofilament, tuning fork, pulses) 0 []  - Peripheral Arterial Disease Assessment (using hand held doppler) 0 ASSESSMENTS - Ostomy and/or Continence Assessment and Care []  - Incontinence Assessment and Management 0 []  - Ostomy Care Assessment and Management (repouching, etc.) 0 PROCESS - Coordination of Care []  - Simple Patient / Family Education for ongoing care 0 X - Complex (extensive) Patient / Family Education for ongoing care 1 20 X - Staff obtains Programmer, systems, Records, Test Results / Process Orders 1 10 Grandmaison, Hilman N. ([id number]) []  - Staff telephones HHA, Nursing Homes / Clarify orders / etc 0 []  - Routine Transfer to another Facility (non-emergent condition) 0 []  - Routine Hospital Admission (non-emergent condition) 0 []  - New Admissions / Biomedical engineer / Ordering NPWT, Apligraf, etc. 0 []  - Emergency Hospital Admission (emergent condition) 0 X - Simple Discharge Coordination 1 10 []  - Complex (extensive) Discharge Coordination 0 PROCESS - Special Needs []  - Pediatric / Minor Patient Management 0 []  - Isolation Patient Management 0 []  - Hearing / Language / Visual special needs 0 []  - Assessment of Community assistance (transportation, D/C planning, etc.) 0 []  - Additional assistance / Altered mentation 0 []  - Support Surface(s) Assessment (bed, cushion, seat, etc.) 0 INTERVENTIONS - Wound Cleansing / Measurement []  -  Simple Wound Cleansing - one wound 0 X - Complex Wound Cleansing - multiple wounds 5 5 X - Wound Imaging (photographs - any number of wounds) 1 5 []  - Wound Tracing (instead  of photographs) 0 []  - Simple Wound Measurement - one wound 0 X - Complex Wound Measurement - multiple wounds 5 5 INTERVENTIONS - Wound Dressings X - Small Wound Dressing one or multiple wounds 5 10 []  - Medium Wound Dressing one or multiple wounds 0 []  - Large Wound Dressing one or multiple wounds 0 []  - Application of Medications - topical 0 []  - Application of Medications - injection 0 Locklin, Dantavious N. ([id number]) INTERVENTIONS - Miscellaneous []  - External ear exam 0 []  - Specimen Collection (cultures, biopsies, blood, body fluids, etc.) 0 []  - Specimen(s) / Culture(s) sent or taken to Lab for analysis 0 []  - Patient Transfer (multiple staff / Harrel Lemon Lift / Similar devices) 0 []  - Simple Staple / Suture removal (25 or less) 0 []  - Complex Staple / Suture removal (26 or more) 0 []  - Hypo / Hyperglycemic Management (close monitor of Blood Glucose) 0 []  - Ankle / Brachial Index (ABI) - do not check if billed separately 0 X - Vital Signs 1 5 Has the patient been seen at the hospital within the last three years: Yes Total Score: 160 Level Of Care: New/Established - Level 5 Electronic Signature(s) Signed: 12/30/2014 4:15:34 PM By: Gretta Cool, RN, BSN, Kim RN, BSN Entered By: Gretta Cool, RN, BSN, Kim on 12/30/2014 09:58:30 Goda, [name] ([id number]) -------------------------------------------------------------------------------- Encounter Discharge Information Details Patient Name: [name], [name] 12/30/2014 9:30 Date of Service: AM Medical Record [id number] Number: Patient Account Number: [id number] Date of Birth/Sex: [birth year] (58 y.o. Male) Treating RN: Joaquin Courts, Other Clinician: Primary Care Physician: Ivette Loyal Treating Britto, Minerva Areola, Physician/Extender: Referring Physician: Peri Maris  in Treatment: 6 Encounter Discharge Information Items Discharge Pain Level: 0 Discharge Condition: Stable Ambulatory Status: Wheelchair Discharge Destination: Nursing Home Transportation: Private Auto Accompanied By: sister Schedule Follow-up Appointment: No Medication Reconciliation completed No and provided to Patient/Care Geronimo Diliberto: Provided on Clinical Summary of Care: 12/30/2014 Form Type Recipient Paper Patient RM Electronic Signature(s) Signed: 12/30/2014 10:12:45 AM By: Montey Hora Previous Signature: 12/30/2014 10:07:07 AM Version By: Ruthine Dose Entered By: Montey Hora on 12/30/2014 10:12:45 Carrington, [name] ([id number]) -------------------------------------------------------------------------------- Lower Extremity Assessment Details Patient Name: [name], [name] 12/30/2014 9:30 Date of Service: AM Medical Record [id number] Number: Patient Account Number: [id number] Date of Birth/Sex: 01/14/1956 (58 y.o. Male) Treating RN: Dorthy, Lavella Hammock, Other Clinician: Primary Care Physician: Ivette Loyal Treating Britto, Minerva Areola, Physician/Extender: Referring Physician: Peri Maris in Treatment: 6 Edema Assessment Assessed: [Left: No] [Right: No] Edema: [Left: Ye] [Right: s] Vascular Assessment Pulses: Posterior Tibial Palpable: [Right:No] Doppler: [Right:Monophasic] Dorsalis Pedis Palpable: [Right:No] Doppler: [Right:Monophasic] Extremity colors, hair growth, and conditions: Extremity Color: [Right:Hyperpigmented] Hair Growth on Extremity: [Right:No] Temperature of Extremity: [Right:Warm] Capillary Refill: [Right:> 3 seconds] Toe Nail Assessment Left: Right: Thick: Yes Discolored: Yes Deformed: No Improper Length and Hygiene: Yes Electronic Signature(s) Signed: 12/30/2014 4:34:27 PM By: Montey Hora Entered By: Montey Hora on 12/30/2014 09:34:29 Colford, [name]  ([id number]) -------------------------------------------------------------------------------- Multi Wound Chart Details Patient Name: Joel Monaco. 12/30/2014 9:30 Date of Service: AM Medical Record [id number] Number: Patient Account Number: [id number] Date of Birth/Sex: 1956/01/08 (58 y.o. Male) Treating RN: Laurin Coder, Other Clinician: Primary Care Physician: Ivette Loyal Treating Britto, Minerva Areola, Physician/Extender: Referring Physician: Peri Maris in Treatment: 6 Vital Signs Height(in): 73 Pulse(bpm): 74 Weight(lbs): 183 Blood Pressure 120/87 (mmHg): Body Mass Index(BMI): 24 Temperature(F): 98.7 Respiratory Rate 18 (breaths/min): Photos: [1:No Photos] [2:No Photos] [3:No Photos] Wound Location: [1:Right Metatarsal head first Right Toe Second - Dorsal Right Toe Third - Dorsal -  Dorsal] Wounding Event: [1:Gradually Appeared] [2:Gradually Appeared] [3:Gradually Appeared] 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] Comorbid History: [1:Anemia, Asthma, Coronary Artery Disease, Coronary Artery Disease, Coronary Artery Disease, Hypertension, Type II Diabetes, End Stage Renal Disease, Neuropathy] [2:Anemia, Asthma, Hypertension, Type II Diabetes, End Stage Renal  Disease, Neuropathy] [3:Anemia, Asthma, Hypertension, Type II Diabetes, End Stage Renal Disease, Neuropathy] Date Acquired: [1:09/14/2014] [2:09/14/2014] [3:09/14/2014] Weeks of Treatment: [1:6] [2:6] [3:6] Wound Status: [1:Open] [2:Open] [3:Open] Measurements L x W x D 1.6x1x0.1 [2:3.2x1.4x0.1] [3:3.5x1.5x0.1] (cm) Area (cm) : [1:1.257] [2:3.519] [3:4.123] Volume (cm) : [1:0.126] [2:0.352] [3:0.412] % Reduction in Area: [1:-56.90%] [2:14.60%] [3:-16.70%] % Reduction in Volume: -57.50% [2:14.60%] [3:-16.70%] Classification: [1:Partial Thickness] [2:Partial Thickness] [3:Partial Thickness] HBO Classification: [1:Grade 1]  [2:Grade 1] [3:Grade 1] Exudate Amount: [1:Medium] [2:Medium] [3:Medium] Exudate Type: [1:Serosanguineous] [2:Serosanguineous] [3:Serosanguineous] Exudate Color: [1:red, brown] [2:red, brown] [3:red, brown] Foul Odor After [1:Yes] [2:Yes] [3:Yes] Cleansing: Wolfley, Brentyn N. ([id number]) Odor Anticipated Due to No No No Product Use: Wound Margin: Indistinct, nonvisible Indistinct, nonvisible Indistinct, nonvisible Granulation Amount: None Present (0%) None Present (0%) None Present (0%) Necrotic Amount: Large (67-100%) Large (67-100%) Large (67-100%) Necrotic Tissue: Eschar, Adherent Slough Eschar, Adherent Slough Eschar Exposed Structures: Fascia: No Fascia: No Fascia: No Fat: No Fat: No Fat: No Tendon: No Tendon: No Tendon: No Muscle: No Muscle: No Muscle: No Joint: No Joint: No Joint: No Bone: No Bone: No Bone: No Limited to Skin Limited to Skin Limited to Skin Breakdown Breakdown Breakdown Epithelialization: None None None Periwound Skin Texture: Edema: No Edema: No Edema: No Excoriation: No Excoriation: No Excoriation: No Induration: No Induration: No Induration: No Callus: No Callus: No Callus: No Crepitus: No Crepitus: No Crepitus: No Fluctuance: No Fluctuance: No Fluctuance: No Friable: No Friable: No Friable: No Rash: No Rash: No Rash: No Scarring: No Scarring: No Scarring: No Periwound Skin Moist: Yes Moist: Yes Moist: Yes Moisture: Dry/Scaly: Yes Dry/Scaly: Yes Dry/Scaly: Yes Maceration: No Maceration: No Maceration: No Periwound Skin Color: Atrophie Blanche: No Atrophie Blanche: No Atrophie Blanche: No Cyanosis: No Cyanosis: No Cyanosis: No Ecchymosis: No Ecchymosis: No Ecchymosis: No Erythema: No Erythema: No Erythema: No Hemosiderin Staining: No Hemosiderin Staining: No Hemosiderin Staining: No Mottled: No Mottled: No Mottled: No Pallor: No Pallor: No Pallor: No Rubor: No Rubor: No Rubor: No Temperature: No  Abnormality No Abnormality No Abnormality Tenderness on Yes Yes Yes Palpation: Wound Preparation: Ulcer Cleansing: Ulcer Cleansing: Ulcer Cleansing: Rinsed/Irrigated with Rinsed/Irrigated with Rinsed/Irrigated with Saline Saline Saline Topical Anesthetic Topical Anesthetic Topical Anesthetic Applied: Other: lidocaine Applied: Other: lidocaine Applied: Other: lidocaine 4% 4% 4% Wound Number: 4 5 N/A Photos: No Photos No Photos N/A Wound Location: Right Toe Fourth - Dorsal Right Metatarsal head fifth N/A - Dorsal Metheny, Devion Delane Ginger ([id number]) Wounding Event: Gradually Appeared Gradually Appeared N/A 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 N/A Comorbid History: Anemia, Asthma, Anemia, Asthma, N/A Coronary Artery Disease, Coronary Artery Disease, Hypertension, Type II Hypertension, Type II Diabetes, End Stage Diabetes, End Stage Renal Disease, Renal Disease, Neuropathy Neuropathy Date Acquired: 09/14/2014 09/14/2014 N/A Weeks of Treatment: 6 6 N/A Wound Status: Open Open N/A Measurements L x W x D 2.5x1x0.1 2x1.6x0.1 N/A (cm) Area (cm) : 1.963 2.513 N/A Volume (cm) : 0.196 0.251 N/A % Reduction in Area: -177.70% 0.00% N/A % Reduction in Volume: -176.10% 0.00% N/A Classification: Partial Thickness Partial Thickness N/A HBO Classification: Grade 1 Grade 1 N/A Exudate Amount: Small N/A N/A Exudate Type: Serosanguineous N/A N/A Exudate Color: red, brown  N/A N/A Foul Odor After No No N/A Cleansing: Odor Anticipated Due to N/A N/A N/A Product Use: Wound Margin: Distinct, outline attached Indistinct, nonvisible N/A Granulation Amount: None Present (0%) None Present (0%) N/A Necrotic Amount: Large (67-100%) Large (67-100%) N/A Necrotic Tissue: Eschar, Adherent Slough Eschar, Adherent Slough N/A Exposed Structures: Fascia: No Fascia: No N/A Fat: No Fat: No Tendon: No Tendon: No Muscle: No Muscle: No Joint: No Joint: No Bone: No Bone:  No Limited to Skin Limited to Skin Breakdown Breakdown Epithelialization: None None N/A Periwound Skin Texture: Edema: No Edema: No N/A Excoriation: No Excoriation: No Induration: No Induration: No Callus: No Callus: No Crepitus: No Crepitus: No Fluctuance: No Fluctuance: No Friable: No Friable: No Rash: No Rash: No Scarring: No Scarring: No Periwound Skin N/A Moisture: Goins, Kethan N. ([id number]) Moist: Yes Moist: Yes Dry/Scaly: Yes Dry/Scaly: Yes Maceration: No Maceration: No Periwound Skin Color: Atrophie Blanche: No Atrophie Blanche: No N/A Cyanosis: No Cyanosis: No Ecchymosis: No Ecchymosis: No Erythema: No Erythema: No Hemosiderin Staining: No Hemosiderin Staining: No Mottled: No Mottled: No Pallor: No Pallor: No Rubor: No Rubor: No Temperature: No Abnormality No Abnormality N/A Tenderness on Yes Yes N/A Palpation: Wound Preparation: Ulcer Cleansing: Ulcer Cleansing: N/A Rinsed/Irrigated with Rinsed/Irrigated with Saline Saline Topical Anesthetic Topical Anesthetic Applied: Other: lidocaine Applied: Other: lidocaine 4% 4% Treatment Notes Electronic Signature(s) Signed: 12/30/2014 4:15:34 PM By: Gretta Cool, RN, BSN, Kim RN, BSN Entered By: Gretta Cool, RN, BSN, Kim on 12/30/2014 09:47:33 Allerton, [name] ([id number]) -------------------------------------------------------------------------------- [place] Details Patient Name: [name], [name] 12/30/2014 9:30 Date of Service: AM Medical Record [id number] Number: Patient Account Number: [id number] Date of Birth/Sex: Feb 23, 1956 (58 y.o. Male) Treating RN: Laurin Coder, Other Clinician: Primary Care Physician: Ivette Loyal Treating Britto, Minerva Areola, Physician/Extender: Referring Physician: Peri Maris in Treatment: 6 Active Inactive Electronic Signature(s) Signed: 01/04/2015 2:59:35 PM By: Gretta Cool RN, BSN, Kim RN, BSN Previous Signature: 12/30/2014 4:15:34  PM Version By: Gretta Cool RN, BSN, Kim RN, BSN Entered By: Gretta Cool, RN, BSN, Kim on 01/03/2015 15:40:38 Mccranie, [name] ([id number]) -------------------------------------------------------------------------------- Pain Assessment Details Patient Name: RUSHTON, DIVAN 12/30/2014 9:30 Date of Service: AM Medical Record [id number] Number: Patient Account Number: [id number] Date of Birth/Sex: Apr 05, 1956 (58 y.o. Male) Treating RN: Dorthy, Lavella Hammock, Other Clinician: Primary Care Physician: Ivette Loyal Treating Britto, Minerva Areola, Physician/Extender: Referring Physician: Peri Maris in Treatment: 6 Active Problems Location of Pain Severity and Description of Pain Patient Has Paino Yes Site Locations Pain Location: Pain in Ulcers With Dressing Change: Yes Duration of the Pain. Constant / Intermittento Constant Pain Management and Medication Current Pain Management: Electronic Signature(s) Signed: 12/30/2014 4:34:27 PM By: Montey Hora Entered By: Montey Hora on 12/30/2014 09:26:22 Leikam, [name] ([id number]) -------------------------------------------------------------------------------- Patient/Caregiver Education Details Patient Name: [name], [name] 12/30/2014 9:30 Date of Service: AM Medical Record [id number] Number: Patient Account Number: [id number] Date of Birth/Gender: Jun 15, 1955 (58 y.o. Male) Treating RN: Dorthy, Lavella Hammock, Other Clinician: Primary Care Physician: Ivette Loyal Treating Britto, Minerva Areola, Physician/Extender: Referring Physician: Peri Maris in Treatment: 6 Education Assessment Education Provided To: Patient and Caregiver Education Topics Provided Wound/Skin Impairment: Handouts: Other: next steps in wound healing process including visit with [facility] for possible amputation Methods: Explain/Verbal Responses: State content correctly Electronic Signature(s) Signed: 12/30/2014 10:13:21 AM By: Montey Hora Entered By: Montey Hora on 12/30/2014 10:13:21 Bellisario, [name] ([id number]) -------------------------------------------------------------------------------- Wound Assessment Details Patient Name: Joel Monaco. 12/30/2014 9:30 Date of Service: AM Medical Record [id number] Number: Patient Account Number: [id number] Date of Birth/Sex: 02-03-1956 (  59 y.o. Male) Treating RN: Dorthy, Lavella Hammock, Other Clinician: Primary Care Physician: Ivette Loyal Treating Britto, Minerva Areola, Physician/Extender: Referring Physician: Peri Maris in Treatment: 6 Wound Status Wound Number: 1 Primary Arterial Insufficiency Ulcer Etiology: Wound Location: Right Metatarsal head first - Dorsal Wound Open Status: Wounding Event: Gradually Appeared Comorbid Anemia, Asthma, Coronary Artery Date Acquired: 09/14/2014 History: Disease, Hypertension, Type II Weeks Of Treatment: 6 Diabetes, End Stage Renal Disease, Clustered Wound: No Neuropathy Photos Photo Uploaded By: Montey Hora on 12/30/2014 10:14:02 Wound Measurements Length: (cm) 1.6 Width: (cm) 1 Depth: (cm) 0.1 Area: (cm) 1.257 Volume: (cm) 0.126 % Reduction in Area: -56.9% % Reduction in Volume: -57.5% Epithelialization: None Tunneling: No Undermining: No Wound Description Classification: Partial Thickness Diabetic Severity (Wagner): Grade 1 Wound Margin: Indistinct, nonvisible Exudate Amount: Medium Exudate Type: Serosanguineous Exudate Color: red, brown Monterrosa, Karsen N. ([id number]) Foul Odor After Cleansing: Yes Due to Product Use: No Wound Bed Granulation Amount: None Present (0%) Exposed Structure Necrotic Amount: Large (67-100%) Fascia Exposed: No Necrotic Quality: Eschar, Adherent Slough Fat Layer Exposed: No Tendon Exposed: No Muscle Exposed: No Joint Exposed: No Bone Exposed: No Limited to Skin Breakdown Periwound Skin Texture Texture Color No Abnormalities Noted: No No  Abnormalities Noted: No Callus: No Atrophie Blanche: No Crepitus: No Cyanosis: No Excoriation: No Ecchymosis: No Fluctuance: No Erythema: No Friable: No Hemosiderin Staining: No Induration: No Mottled: No Localized Edema: No Pallor: No Rash: No Rubor: No Scarring: No Temperature / Pain Moisture Temperature: No Abnormality No Abnormalities Noted: No Tenderness on Palpation: Yes Dry / Scaly: Yes Maceration: No Moist: Yes Wound Preparation Ulcer Cleansing: Rinsed/Irrigated with Saline Topical Anesthetic Applied: Other: lidocaine 4%, Electronic Signature(s) Signed: 12/30/2014 9:42:21 AM By: Montey Hora Entered By: Montey Hora on 12/30/2014 09:42:20 [name], [name] ([id number]) -------------------------------------------------------------------------------- Wound Assessment Details Patient Name: [name], [name] 12/30/2014 9:30 Date of Service: AM Medical Record [id number] Number: Patient Account Number: [id number] Date of Birth/Sex: 15-May-1956 (58 y.o. Male) Treating RN: Dorthy, Lavella Hammock, Other Clinician: Primary Care Physician: Ivette Loyal Treating Britto, Minerva Areola, Physician/Extender: Referring Physician: Peri Maris in Treatment: 6 Wound Status Wound Number: 2 Primary Arterial Insufficiency Ulcer Etiology: Wound Location: Right Toe Second - Dorsal Wound Open Wounding Event: Gradually Appeared Status: Date Acquired: 09/14/2014 Comorbid Anemia, Asthma, Coronary Artery Weeks Of Treatment: 6 History: Disease, Hypertension, Type II Clustered Wound: No Diabetes, End Stage Renal Disease, Neuropathy Photos Photo Uploaded By: Montey Hora on 12/30/2014 10:15:20 Wound Measurements Length: (cm) 3.2 Width: (cm) 1.4 Depth: (cm) 0.1 Area: (cm) 3.519 Volume: (cm) 0.352 % Reduction in Area: 14.6% % Reduction in Volume: 14.6% Epithelialization: None Tunneling: No Undermining: No Wound Description Classification: Partial  Thickness Diabetic Severity (Wagner): Grade 1 Wound Margin: Indistinct, nonvisible Exudate Amount: Medium Exudate Type: Serosanguineous Exudate Color: red, brown Bugge, Carlson N. ([id number]) Foul Odor After Cleansing: Yes Due to Product Use: No Wound Bed Granulation Amount: None Present (0%) Exposed Structure Necrotic Amount: Large (67-100%) Fascia Exposed: No Necrotic Quality: Eschar, Adherent Slough Fat Layer Exposed: No Tendon Exposed: No Muscle Exposed: No Joint Exposed: No Bone Exposed: No Limited to Skin Breakdown Periwound Skin Texture Texture Color No Abnormalities Noted: No No Abnormalities Noted: No Callus: No Atrophie Blanche: No Crepitus: No Cyanosis: No Excoriation: No Ecchymosis: No Fluctuance: No Erythema: No Friable: No Hemosiderin Staining: No Induration: No Mottled: No Localized Edema: No Pallor: No Rash: No Rubor: No Scarring: No Temperature / Pain Moisture Temperature: No Abnormality No Abnormalities Noted: No Tenderness on Palpation: Yes Dry / Scaly: Yes Maceration:  No Moist: Yes Wound Preparation Ulcer Cleansing: Rinsed/Irrigated with Saline Topical Anesthetic Applied: Other: lidocaine 4%, Electronic Signature(s) Signed: 12/30/2014 9:42:40 AM By: Montey Hora Entered By: Montey Hora on 12/30/2014 09:42:40 Ishman, [name] ([id number]) -------------------------------------------------------------------------------- Wound Assessment Details Patient Name: [name], [name] 12/30/2014 9:30 Date of Service: AM Medical Record [id number] Number: Patient Account Number: [id number] Date of Birth/Sex: [birth year] (58 y.o. Male) Treating RN: Dorthy, Lavella Hammock, Other Clinician: Primary Care Physician: Ivette Loyal Treating Britto, Minerva Areola, Physician/Extender: Referring Physician: Peri Maris in Treatment: 6 Wound Status Wound Number: 3 Primary Arterial Insufficiency Ulcer Etiology: Wound Location: Right Toe  Third - Dorsal Wound Open Wounding Event: Gradually Appeared Status: Date Acquired: 09/14/2014 Comorbid Anemia, Asthma, Coronary Artery Weeks Of Treatment: 6 History: Disease, Hypertension, Type II Clustered Wound: No Diabetes, End Stage Renal Disease, Neuropathy Photos Photo Uploaded By: Montey Hora on 12/30/2014 10:15:00 Wound Measurements Length: (cm) 3.5 Width: (cm) 1.5 Depth: (cm) 0.1 Area: (cm) 4.123 Volume: (cm) 0.412 % Reduction in Area: -16.7% % Reduction in Volume: -16.7% Epithelialization: None Tunneling: No Undermining: No Wound Description Classification: Partial Thickness Diabetic Severity (Wagner): Grade 1 Wound Margin: Indistinct, nonvisible Exudate Amount: Medium Exudate Type: Serosanguineous Exudate Color: red, brown Ziemann, Courtenay N. ([id number]) Foul Odor After Cleansing: Yes Due to Product Use: No Wound Bed Granulation Amount: None Present (0%) Exposed Structure Necrotic Amount: Large (67-100%) Fascia Exposed: No Necrotic Quality: Eschar Fat Layer Exposed: No Tendon Exposed: No Muscle Exposed: No Joint Exposed: No Bone Exposed: No Limited to Skin Breakdown Periwound Skin Texture Texture Color No Abnormalities Noted: No No Abnormalities Noted: No Callus: No Atrophie Blanche: No Crepitus: No Cyanosis: No Excoriation: No Ecchymosis: No Fluctuance: No Erythema: No Friable: No Hemosiderin Staining: No Induration: No Mottled: No Localized Edema: No Pallor: No Rash: No Rubor: No Scarring: No Temperature / Pain Moisture Temperature: No Abnormality No Abnormalities Noted: No Tenderness on Palpation: Yes Dry / Scaly: Yes Maceration: No Moist: Yes Wound Preparation Ulcer Cleansing: Rinsed/Irrigated with Saline Topical Anesthetic Applied: Other: lidocaine 4%, Electronic Signature(s) Signed: 12/30/2014 9:43:01 AM By: Montey Hora Entered By: Montey Hora on 12/30/2014 09:43:01 Langham, [name]  ([id number]) -------------------------------------------------------------------------------- Wound Assessment Details Patient Name: [name], [name] 12/30/2014 9:30 Date of Service: AM Medical Record [id number] Number: Patient Account Number: [id number] Date of Birth/Sex: 05/10/1956 (58 y.o. Male) Treating RN: Dorthy, Lavella Hammock, Other Clinician: Primary Care Physician: Ivette Loyal Treating Britto, Minerva Areola, Physician/Extender: Referring Physician: Peri Maris in Treatment: 6 Wound Status Wound Number: 4 Primary Arterial Insufficiency Ulcer Etiology: Wound Location: Right Toe Fourth - Dorsal Wound Open Wounding Event: Gradually Appeared Status: Date Acquired: 09/14/2014 Comorbid Anemia, Asthma, Coronary Artery Weeks Of Treatment: 6 History: Disease, Hypertension, Type II Clustered Wound: No Diabetes, End Stage Renal Disease, Neuropathy Photos Photo Uploaded By: Montey Hora on 12/30/2014 10:15:01 Wound Measurements Length: (cm) 2.5 Width: (cm) 1 Depth: (cm) 0.1 Area: (cm) 1.963 Volume: (cm) 0.196 % Reduction in Area: -177.7% % Reduction in Volume: -176.1% Epithelialization: None Tunneling: No Undermining: No Wound Description Classification: Partial Thickness Diabetic Severity (Wagner): Grade 1 Wound Margin: Distinct, outline attached Exudate Amount: Small Exudate Type: Serosanguineous Exudate Color: red, brown Burpee, Leeum N. ([id number]) Foul Odor After Cleansing: No Wound Bed Granulation Amount: None Present (0%) Exposed Structure Necrotic Amount: Large (67-100%) Fascia Exposed: No Necrotic Quality: Eschar, Adherent Slough Fat Layer Exposed: No Tendon Exposed: No Muscle Exposed: No Joint Exposed: No Bone Exposed: No Limited to Skin Breakdown Periwound Skin Texture Texture Color No Abnormalities Noted: No No Abnormalities Noted: No Callus:  No Atrophie Blanche: No Crepitus: No Cyanosis: No Excoriation: No Ecchymosis:  No Fluctuance: No Erythema: No Friable: No Hemosiderin Staining: No Induration: No Mottled: No Localized Edema: No Pallor: No Rash: No Rubor: No Scarring: No Temperature / Pain Moisture Temperature: No Abnormality No Abnormalities Noted: No Tenderness on Palpation: Yes Dry / Scaly: Yes Maceration: No Moist: Yes Wound Preparation Ulcer Cleansing: Rinsed/Irrigated with Saline Topical Anesthetic Applied: Other: lidocaine 4%, Electronic Signature(s) Signed: 12/30/2014 9:43:28 AM By: Montey Hora Entered By: Montey Hora on 12/30/2014 09:43:27 Wearing, [name] ([id number]) -------------------------------------------------------------------------------- Wound Assessment Details Patient Name: [name], [name] 12/30/2014 9:30 Date of Service: AM Medical Record [id number] Number: Patient Account Number: [id number] Date of Birth/Sex: [birth year] (58 y.o. Male) Treating RN: Dorthy, Lavella Hammock, Other Clinician: Primary Care Physician: Ivette Loyal Treating Britto, Minerva Areola, Physician/Extender: Referring Physician: Peri Maris in Treatment: 6 Wound Status Wound Number: 5 Primary Arterial Insufficiency Ulcer Etiology: Wound Location: Right Metatarsal head fifth - Dorsal Wound Open Status: Wounding Event: Gradually Appeared Comorbid Anemia, Asthma, Coronary Artery Date Acquired: 09/14/2014 History: Disease, Hypertension, Type II Weeks Of Treatment: 6 Diabetes, End Stage Renal Disease, Clustered Wound: No Neuropathy Photos Photo Uploaded By: Montey Hora on 12/30/2014 10:14:33 Wound Measurements Length: (cm) 2 Width: (cm) 1.6 Depth: (cm) 0.1 Area: (cm) 2.513 Volume: (cm) 0.251 % Reduction in Area: 0% % Reduction in Volume: 0% Epithelialization: None Tunneling: No Undermining: No Wound Description Classification: Partial Thickness Foul Odor Diabetic Severity Earleen Newport): Grade 1 Wound Margin: Indistinct, nonvisible After Cleansing:  No Wound Bed Granulation Amount: None Present (0%) Exposed Structure Necrotic Amount: Large (67-100%) Fascia Exposed: No Sossamon, Tylen N. ([id number]) Necrotic Quality: Eschar, Adherent Slough Fat Layer Exposed: No Tendon Exposed: No Muscle Exposed: No Joint Exposed: No Bone Exposed: No Limited to Skin Breakdown Periwound Skin Texture Texture Color No Abnormalities Noted: No No Abnormalities Noted: No Callus: No Atrophie Blanche: No Crepitus: No Cyanosis: No Excoriation: No Ecchymosis: No Fluctuance: No Erythema: No Friable: No Hemosiderin Staining: No Induration: No Mottled: No Localized Edema: No Pallor: No Rash: No Rubor: No Scarring: No Temperature / Pain Moisture Temperature: No Abnormality No Abnormalities Noted: No Tenderness on Palpation: Yes Dry / Scaly: Yes Maceration: No Moist: Yes Wound Preparation Ulcer Cleansing: Rinsed/Irrigated with Saline Topical Anesthetic Applied: Other: lidocaine 4%, Electronic Signature(s) Signed: 12/30/2014 9:43:46 AM By: Montey Hora Entered By: Montey Hora on 12/30/2014 09:43:46 [place], [name] ([id number]) -------------------------------------------------------------------------------- Vitals Details Patient Name: CARTIER, LLANES 12/30/2014 9:30 Date of Service: AM Medical Record [id number] Number: Patient Account Number: [id number] Date of Birth/Sex: 07-22-1955 (58 y.o. Male) Treating RN: Dorthy, Lavella Hammock, Other Clinician: Primary Care Physician: Ivette Loyal Treating Britto, Minerva Areola, Physician/Extender: Referring Physician: Peri Maris in Treatment: 6 Vital Signs Time Taken: 09:26 Temperature (F): 98.7 Height (in): 73 Pulse (bpm): 74 Weight (lbs): 183 Respiratory Rate (breaths/min): 18 Body Mass Index (BMI): 24.1 Blood Pressure (mmHg): 120/87 Reference Range: 80 - 120 mg / dl Electronic Signature(s) Signed: 12/30/2014 4:34:27 PM By: Montey Hora Entered By: Montey Hora on 12/30/2014 09:28:06

## 2014-12-31 NOTE — Progress Notes (Addendum)
ANA, KLEBE ([id number]) Visit Report for 12/30/2014 HPI Details Patient Name: [name], [name] 12/30/2014 9:30 Date of Service: AM Medical Record [id number] Number: Patient Account Number: [id number] Date of Birth/Sex: March 02, 1956 (59 y.o. Male) Treating RN: Primary Care SLADE-HARTMAN, Other Clinician: Physician: Ivette Loyal Treating Iyad Deroo, Minerva Areola, Physician/Extender: Referring Physician: Peri Maris in Treatment: 6 History of Present Illness Location: right foot pain with ulcers on his forefoot Quality: Patient reports experiencing burning to affected area(s). Severity: Patient states wound are getting worse. Duration: Patient has had the wound for > 3 months prior to seeking treatment at the wound center Timing: Pain in wound is constant (hurts all the time) Context: The wound appeared gradually over time Modifying Factors: Consults to this date include: vascular consult for angioplasty Associated Signs and Symptoms: Patient reports having: difficulty in using his prosthesis and is wheelchair-bound. HPI Description: Seen by Dr. Leotis Pain on 08/29/2014 with ulceration on 2 toes on the right foot and dark discoloration worrisome for gangrenous changes on his 2nd and 3rd toes as well.o Given his previous history of below-knee amputation for peripheral disease and ulceration on the left, his multiple atherosclerotic risk factors and his poorly palpable pedal pulses, he was brought in for angiography for further evaluation and potential treatment. He had a percutaneous angioplasty of the right popliteal artery and the right anterior tibial artery and dorsalis pedis artery. He also had angioplasty of the right peroneal artery and the proximal and mid right posterior tibial artery. On 11/07/2014 he's had a duplex of the right lower extremity which showed the ABI was 0.9 and had a multiphasic waveform. Impression was of mild peripheral vascular disease predominantly small  vessel disease right ankle. 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 11/22/2014 -- he has been off cigarettes completely since Saturday and is on a nicotine patch and have commended him about this. 12/08/2014 -- he was seen in the vascular office at [facility] and has been scheduled for a angiogram at the end of this month -- next Thursday. I have urged him to keep this appointment. 12/30/2014 -- since I saw him last on July 14 he had a procedure done by Dr. Lucky Cowboy on 12/15/2014. he had a aortogram and selective right lower extremity angiogram followed by a percutaneous angioplasty of the right Leatherbury, Xzavion N. ([id number]) popliteal artery, right anterior tibial artery, right peroneal artery, proximal and mid right posterior tibial artery. 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 Today he continues to have pain and he says that some of his toes are black. Electronic Signature(s) Signed: 12/30/2014 10:14:46 AM By: Christin Fudge MD, FACS Entered By: Christin Fudge on 12/30/2014 10:14:45 Ewing, [name] ([id number]) -------------------------------------------------------------------------------- Physical Exam Details Patient Name: [name] 12/30/2014 9:30 Date of Service: AM Medical Record [id number] Number: Patient Account Number: [id number] Date of Birth/Sex: [birth year] (59 y.o. Male) Treating RN: Primary Care SLADE-HARTMAN, Other Clinician: Physician: Ivette Loyal Treating Tristina Sahagian, Minerva Areola, Physician/Extender: Referring Physician: Peri Maris in Treatment: 6 Constitutional . Pulse regular. Respirations normal and unlabored. Afebrile. . Eyes Nonicteric. Reactive to light. Ears, Nose, Mouth, and Throat Lips, teeth, and gums WNL.[name] Moist mucosa  without lesions . Neck supple and nontender. No palpable supraclavicular or cervical adenopathy. Normal sized without goiter. Respiratory WNL. No retractions.. Cardiovascular Pedal  Pulses right DP palpable. On Doppler he is got a multiphasic signal on the right DP.[name] No clubbing, cyanosis or edema. Lymphatic No adneopathy. No adenopathy. No adenopathy. Musculoskeletal Adexa without tenderness or enlargement.. Digits and nails w/o clubbing, cyanosis, infection, petechiae, ischemia, or inflammatory conditions.. Integumentary (Hair, Skin) No suspicious lesions. No crepitus or fluctuance. No peri-wound warmth or erythema. No masses.[name] Psychiatric Judgement and insight Intact.. No evidence of depression, anxiety, or agitation.. Notes the right distal foot shows changes of gangrene and the second third and part of his fourth toe have dry gangrene. The heart of the forefoot also has some changes of dry gangrene. Electronic Signature(s) Signed: 12/30/2014 10:15:53 AM By: Christin Fudge MD, FACS Entered By: Christin Fudge on 12/30/2014 10:15:53 Josey, [name] ([id number]) -------------------------------------------------------------------------------- Physician Orders Details Patient Name: [name] 12/30/2014 9:30 Date of Service: AM Medical Record [id number] Number: Patient Account Number: [id number] Date of Birth/Sex: [birth year] (59 y.o. Male) Treating RN: Cornell Barman Primary Care [facility], Other Clinician: Physician: Ivette Loyal Treating Arvie Villarruel, Minerva Areola, Physician/Extender: Referring Physician: Peri Maris in Treatment: 6 Verbal / Phone Orders: Yes Clinician: Cornell Barman Read Back and Verified: Yes Diagnosis Coding Wound Cleansing Wound #1 Right,Dorsal Metatarsal head first o Clean wound with Normal Saline. Wound #2 Right,Dorsal Toe Second o Clean wound with Normal Saline. Wound #3 Right,Dorsal Toe Third o Clean wound with Normal Saline. Wound #4  Right,Dorsal Toe Fourth o Clean wound with Normal Saline. Wound #5 Right,Dorsal Metatarsal head fifth o Clean wound with Normal Saline. Anesthetic Wound #1 Right,Dorsal Metatarsal head first o Topical Lidocaine 4% cream applied to wound bed prior to debridement Wound #2 Right,Dorsal Toe Second o Topical Lidocaine 4% cream applied to wound bed prior to debridement Wound #3 Right,Dorsal Toe Third o Topical Lidocaine 4% cream applied to wound bed prior to debridement Wound #4 Right,Dorsal Toe Fourth o Topical Lidocaine 4% cream applied to wound bed prior to debridement Wound #5 Right,Dorsal Metatarsal head fifth o Topical Lidocaine 4% cream applied to wound bed prior to debridement Primary Wound Dressing Wound #1 Right,Dorsal Metatarsal head first Tovey, Yecheskel N. ([id number]) o Other: - Paint with Betadine Wound #2 Right,Dorsal Toe Second o Other: - Paint with Betadine Wound #3 Right,Dorsal Toe Third o Other: - Paint with Betadine Wound #4 Right,Dorsal Toe Fourth o Other: - Paint with Betadine Wound #5 Right,Dorsal Metatarsal head fifth o Other: - Paint with Betadine Secondary Dressing Wound #1 Right,Dorsal Metatarsal head first o Conform/Kerlix Wound #2 Right,Dorsal Toe Second o Conform/Kerlix Wound #3 Right,Dorsal Toe Third o Conform/Kerlix Wound #4 Right,Dorsal Toe Fourth o Conform/Kerlix Wound #5 Right,Dorsal Metatarsal head fifth o Conform/Kerlix Dressing Change Frequency Wound #1 Right,Dorsal Metatarsal head first o Change dressing every day. Wound #2 Right,Dorsal Toe Second o Change dressing every day. Wound #3 Right,Dorsal Toe Third o Change dressing every day. Wound #4 Right,Dorsal Toe Fourth o Change dressing every day. Wound #5 Right,Dorsal Metatarsal head fifth o Change dressing every day. Follow-up Appointments Wound #1 Right,Dorsal Metatarsal head first Gaal, Imad N. ([id number]) o Return Appointment in 1  week. Wound #2 Right,Dorsal Toe Second o Return Appointment in 1 week. Wound #3 Right,Dorsal Toe Third o Return Appointment in 1 week. Wound #4 Right,Dorsal Toe Fourth o Return Appointment in 1 week. Wound #5 Right,Dorsal Metatarsal head fifth o Return Appointment in 1 week. Electronic Signature(s) Signed: 12/30/2014 12:28:33 PM By: Christin Fudge MD, FACS Signed: 12/30/2014 4:15:34 PM By: Gretta Cool RN, BSN, Kim RN, BSN Entered By: Gretta Cool, RN, BSN, Kim on 12/30/2014 09:54:03  Paradis, AADI RICHARDSON ([id number]) -------------------------------------------------------------------------------- Problem List Details Patient Name: JANDRE, SALDIERNA 12/30/2014 9:30 Date of Service: AM Medical Record [id number] Number: Patient Account Number: [id number] Date of Birth/Sex: 12-Mar-1956 (58 y.o. 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 N18.6 End stage renal disease 11/17/2014 Yes F17.219 Nicotine dependence, cigarettes, with unspecified 11/17/2014 Yes nicotine-induced disorders Z89.512 Acquired absence of left leg below knee 11/17/2014 Yes I70.261 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 12/30/2014 9:30 Date of Service: AM Medical  Record [id number] Number: Patient Account Number: [id number] Date of Birth/Sex: [birth year] (58 y.o. Male) Treating RN: Primary Care SLADE-HARTMAN, Other Clinician: Physician: Ivette Loyal Treating Shatiqua Heroux, Minerva Areola, Physician/Extender: Referring Physician: Peri Maris in Treatment: 6 Subjective History of Present Illness (HPI) The following HPI elements were documented for the patient's wound: Location: right foot pain with ulcers on his forefoot Quality: Patient reports experiencing burning to affected area(s). Severity: Patient states wound are getting worse. Duration: Patient has had the wound for > 3 months prior to seeking treatment at the wound center Timing: Pain in wound is constant (hurts all the time) Context: The wound appeared gradually over time Modifying Factors: Consults to this date include: vascular consult for angioplasty Associated Signs and Symptoms: Patient reports having: difficulty in using his prosthesis and is wheelchair- bound. Seen by Dr. Leotis Pain on 08/29/2014 with ulceration on 2 toes on the right foot and dark discoloration worrisome for gangrenous changes on his 2nd and 3rd toes as well. Given his previous history of below- knee amputation for peripheral disease and ulceration on the left, his multiple atherosclerotic risk factors and his poorly palpable pedal pulses, he was brought in for angiography for further evaluation and potential treatment. He had a percutaneous angioplasty of the right popliteal artery and the right anterior tibial artery and dorsalis pedis artery. He also had angioplasty of the right peroneal artery and the proximal and mid right posterior tibial artery. On 11/07/2014 he's had a duplex of the right lower extremity which showed the ABI was 0.9 and had a multiphasic waveform. Impression was of mild peripheral vascular disease predominantly small vessel disease right ankle. Past medical history significant for status  post left BKA 12/15, diabetes mellitus type II, ESRD, on hemodialysis regularly. 11/22/2014 -- he has been off cigarettes completely since Saturday and is on a nicotine patch and have commended him about this. 12/08/2014 -- he was seen in the vascular office at [facility] and has been scheduled for a angiogram at the end of this month -- next Thursday. I have urged him to keep this appointment. 12/30/2014 -- since I saw him last on July 14 he had a procedure done by Dr. Lucky Cowboy on 12/15/2014. he had a aortogram and selective right lower extremity angiogram followed by a percutaneous angioplasty of the right Lasure, Mithran N. ([id number]) popliteal artery, right anterior tibial artery, right peroneal artery, proximal and mid right posterior tibial artery. the patient was then seen with progressive gangrene of the right  foot which was a dry gangrene and the patient was asked to follow-up with the vascular surgeons but did not do so last week. Today he continues to have pain and he says that some of his toes are black. Objective Constitutional Pulse regular. Respirations normal and unlabored. Afebrile. Vitals Time Taken: 9:26 AM, Height: 73 in, Weight: 183 lbs, BMI: 24.1, Temperature: 98.7 F, Pulse: 74 bpm, Respiratory Rate: 18 breaths/min, Blood Pressure: 120/87 mmHg. Eyes Nonicteric. Reactive to light. Ears, Nose, Mouth, and Throat Lips, teeth, and gums WNL.[name] Moist mucosa without lesions . Neck supple and nontender. No palpable supraclavicular or cervical adenopathy. Normal sized without goiter. Respiratory WNL. No retractions.. Cardiovascular Pedal Pulses right DP palpable. On Doppler he is got a multiphasic signal on the right DP.[name] No clubbing, cyanosis or edema. Lymphatic No adneopathy. No adenopathy. No adenopathy. Musculoskeletal Adexa without tenderness or enlargement.. Digits and nails w/o clubbing, cyanosis, infection, petechiae, ischemia, or inflammatory  conditions.[name] Psychiatric Judgement and insight Intact.. No evidence of depression, anxiety, or agitation.. General Notes: the right distal foot shows changes of gangrene and the second third and part of his fourth toe have dry gangrene. The heart of the forefoot also has some changes of dry gangrene. Integumentary (Hair, Skin) Luckett, Daysen N. ([id number]) No suspicious lesions. No crepitus or fluctuance. No peri-wound warmth or erythema. No masses.. Wound #1 status is Open. Original cause of wound was Gradually Appeared. The wound is located on the Right,Dorsal Metatarsal head first. The wound measures 1.6cm length x 1cm width x 0.1cm depth; 1.257cm^2 area and 0.126cm^3 volume. The wound is limited to skin breakdown. There is no tunneling or undermining noted. There is a medium amount of serosanguineous drainage noted. The wound margin is indistinct and nonvisible. There is no granulation within the wound bed. There is a large (67-100%) amount of necrotic tissue within the wound bed including Eschar and Adherent Slough. The periwound skin appearance exhibited: Dry/Scaly, Moist. The periwound skin appearance did not exhibit: Callus, Crepitus, Excoriation, Fluctuance, Friable, Induration, Localized Edema, Rash, Scarring, Maceration, Atrophie Blanche, Cyanosis, Ecchymosis, Hemosiderin Staining, Mottled, Pallor, Rubor, Erythema. Periwound temperature was noted as No Abnormality. The periwound has tenderness on palpation. Wound #2 status is Open. Original cause of wound was Gradually Appeared. The wound is located on the Right,Dorsal Toe Second. The wound measures 3.2cm length x 1.4cm width x 0.1cm depth; 3.519cm^2 area and 0.352cm^3 volume. The wound is limited to skin breakdown. There is no tunneling or undermining noted. There is a medium amount of serosanguineous drainage noted. The wound margin is indistinct and nonvisible. There is no granulation within the wound bed. There is a large  (67-100%) amount of necrotic tissue within the wound bed including Eschar and Adherent Slough. The periwound skin appearance exhibited: Dry/Scaly, Moist. The periwound skin appearance did not exhibit: Callus, Crepitus, Excoriation, Fluctuance, Friable, Induration, Localized Edema, Rash, Scarring, Maceration, Atrophie Blanche, Cyanosis, Ecchymosis, Hemosiderin Staining, Mottled, Pallor, Rubor, Erythema. Periwound temperature was noted as No Abnormality. The periwound has tenderness on palpation. Wound #3 status is Open. Original cause of wound was Gradually Appeared. The wound is located on the Right,Dorsal Toe Third. The wound measures 3.5cm length x 1.5cm width x 0.1cm depth; 4.123cm^2 area and 0.412cm^3 volume. The wound is limited to skin breakdown. There is no tunneling or undermining noted. There is a medium amount of serosanguineous drainage noted. The wound margin is indistinct and nonvisible. There is no granulation within the wound bed. There is a large (67-100%) amount of necrotic tissue within  the wound bed including Eschar. The periwound skin appearance exhibited: Dry/Scaly, Moist. The periwound skin appearance did not exhibit: Callus, Crepitus, Excoriation, Fluctuance, Friable, Induration, Localized Edema, Rash, Scarring, Maceration, Atrophie Blanche, Cyanosis, Ecchymosis, Hemosiderin Staining, Mottled, Pallor, Rubor, Erythema. Periwound temperature was noted as No Abnormality. The periwound has tenderness on palpation. Wound #4 status is Open. Original cause of wound was Gradually Appeared. The wound is located on the Right,Dorsal Toe Fourth. The wound measures 2.5cm length x 1cm width x 0.1cm depth; 1.963cm^2 area and 0.196cm^3 volume. The wound is limited to skin breakdown. There is no tunneling or undermining noted. There is a small amount of serosanguineous drainage noted. The wound margin is distinct with the outline attached to the wound base. There is no granulation within  the wound bed. There is a large (67- 100%) amount of necrotic tissue within the wound bed including Eschar and Adherent Slough. The periwound skin appearance exhibited: Dry/Scaly, Moist. The periwound skin appearance did not exhibit: Callus, Crepitus, Excoriation, Fluctuance, Friable, Induration, Localized Edema, Rash, Scarring, Maceration, Atrophie Blanche, Cyanosis, Ecchymosis, Hemosiderin Staining, Mottled, Pallor, Rubor, Erythema. Periwound temperature was noted as No Abnormality. The periwound has tenderness on palpation. Wound #5 status is Open. Original cause of wound was Gradually Appeared. The wound is located on the Right,Dorsal Metatarsal head fifth. The wound measures 2cm length x 1.6cm width x 0.1cm depth; 2.513cm^2 area and 0.251cm^3 volume. The wound is limited to skin breakdown. There is no tunneling or undermining noted. The wound margin is indistinct and nonvisible. There is no granulation within the wound Taher, Weylin N. ([id number]) bed. There is a large (67-100%) amount of necrotic tissue within the wound bed including Eschar and Adherent Slough. The periwound skin appearance exhibited: Dry/Scaly, Moist. The periwound skin appearance did not exhibit: Callus, Crepitus, Excoriation, Fluctuance, Friable, Induration, Localized Edema, Rash, Scarring, Maceration, Atrophie Blanche, Cyanosis, Ecchymosis, Hemosiderin Staining, Mottled, Pallor, Rubor, Erythema. Periwound temperature was noted as No Abnormality. The periwound has tenderness on palpation. 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 Wound #2 Right,Dorsal Toe Second: Clean wound with Normal Saline. Wound #3 Right,Dorsal Toe Third: Clean wound with Normal Saline. Wound #4 Right,Dorsal Toe Fourth: Clean wound with Normal Saline. Wound #5 Right,Dorsal Metatarsal head fifth: Clean wound with Normal Saline. Anesthetic: Wound #1 Right,Dorsal Metatarsal head first: Topical Lidocaine 4% cream applied to wound bed prior to debridement Wound #2 Right,Dorsal Toe Second: Topical Lidocaine 4% cream applied to wound bed prior to debridement Wound #3 Right,Dorsal Toe Third: Topical Lidocaine 4% cream applied to wound bed prior to debridement Wound #4 Right,Dorsal Toe Fourth: Topical Lidocaine 4% cream applied to wound bed prior to debridement Wound #5 Right,Dorsal Metatarsal head fifth: Rojero, Bobbie N. ([id number]) Topical Lidocaine 4% cream applied to wound bed prior to debridement Primary Wound Dressing: Wound #1 Right,Dorsal Metatarsal head first: Other: - Paint with Betadine Wound #2 Right,Dorsal Toe Second: Other: - Paint with Betadine Wound #3 Right,Dorsal Toe Third: Other: - Paint with Betadine Wound #4 Right,Dorsal Toe Fourth: Other: - Paint with Betadine Wound #5 Right,Dorsal Metatarsal head fifth: Other: - Paint  with Betadine Secondary Dressing: Wound #1 Right,Dorsal Metatarsal head first: Conform/Kerlix Wound #2 Right,Dorsal Toe Second: Conform/Kerlix Wound #3 Right,Dorsal Toe Third: Conform/Kerlix Wound #4 Right,Dorsal Toe Fourth: Conform/Kerlix Wound #5 Right,Dorsal Metatarsal head fifth: Conform/Kerlix Dressing Change Frequency: Wound #1 Right,Dorsal Metatarsal head first: Change dressing every day. Wound #2 Right,Dorsal Toe Second: Change dressing every day. Wound #3 Right,Dorsal Toe Third: Change dressing every day. Wound #4 Right,Dorsal Toe Fourth: Change dressing every day. Wound #5  Right,Dorsal Metatarsal head fifth: Change dressing every day. Follow-up Appointments: Wound #1 Right,Dorsal Metatarsal head first: Return Appointment in 1 week. Wound #2 Right,Dorsal Toe Second: Return Appointment in 1 week. Wound #3 Right,Dorsal Toe Third: Return Appointment in 1 week. Wound #4 Right,Dorsal Toe Fourth: Return Appointment in 1 week. Wound #5 Right,Dorsal Metatarsal head fifth: Return Appointment in 1 week. Ogburn, JOHNPETER GLEISSNER. ([id number]) The right foot has demarcated well and the patient is in a lot of pain. I believe he will need an amputation possibly trying to save the right big toe and may end up with a transmetatarsal amputation. I have personally spoken to Dr. Leotis Pain and he and I have discussed the above and Dr. Bunnie Domino office will call the patient for a procedure early next week. This has been communicated to the patient and his caregiver. 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 We have briefly discussed this and the patient will be willing to do this. He will return to [place] after Dr. Lucky Cowboy clears him. Electronic Signature(s) Signed: 01/03/2015 4:11:45 PM By: Christin Fudge MD, FACS Previous Signature: 12/30/2014 10:22:00 AM Version By: Christin Fudge MD, FACS Previous Signature: 12/30/2014 10:17:57 AM Version By: Christin Fudge MD, FACS Entered By: Christin Fudge on 01/03/2015 16:11:45 Moffet, [name] ([id number]) -------------------------------------------------------------------------------- SuperBill Details Patient Name: [name], Samael N. Date of Service: 12/30/2014 Medical Record Number: [id number] Patient Account Number: [id number] Date of Birth/Sex: 1956/04/21 (59 y.o. 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 of foot N18.6 End stage renal disease F17.219 Nicotine dependence, cigarettes, with unspecified nicotine-induced disorders Z89.512 Acquired absence of left leg below knee I70.261 Atherosclerosis of native arteries of extremities with gangrene, right leg Facility Procedures CPT4 Code: [id number] Description: [id number] - WOUND CARE VISIT-LEV 5 EST PT Modifier: Quantity: 1 Physician Procedures CPT4: Description Modifier Quantity Code [id number] - WC PHYS LEVEL 4 - EST PT 1 ICD-10 Description Diagnosis E11.621 Type 2 diabetes mellitus with foot ulcer I70.235 Atherosclerosis of native arteries of right leg with ulceration of other part of  foot N18.6 End stage renal disease 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

## 2015-01-03 ENCOUNTER — Ambulatory Visit
Admission: RE | Admit: 2015-01-03 | Discharge: 2015-01-03 | Disposition: A | Discharge status: Home / Self Care | Payer: Medicare Other | Source: Ambulatory Visit | Attending: Vascular Surgery | Admitting: Vascular Surgery

## 2015-01-03 ENCOUNTER — Encounter
Admission: RE | Admit: 2015-01-03 | Discharge: 2015-01-03 | Disposition: A | Discharge status: Home / Self Care | Payer: Medicare Other | Source: Ambulatory Visit | Attending: Vascular Surgery | Admitting: Vascular Surgery

## 2015-01-03 DIAGNOSIS — I2510 Atherosclerotic heart disease of native coronary artery without angina pectoris: Secondary | ICD-10-CM

## 2015-01-03 DIAGNOSIS — I10 Essential (primary) hypertension: Secondary | ICD-10-CM

## 2015-01-03 DIAGNOSIS — Z01810 Encounter for preprocedural cardiovascular examination: Secondary | ICD-10-CM | POA: Insufficient documentation

## 2015-01-03 DIAGNOSIS — F17200 Nicotine dependence, unspecified, uncomplicated: Secondary | ICD-10-CM

## 2015-01-03 HISTORY — DX: Other complications of anesthesia, initial encounter: T88.59XA

## 2015-01-03 HISTORY — DX: Dependence on renal dialysis: Z99.2

## 2015-01-03 HISTORY — DX: Heart failure, unspecified: I50.9

## 2015-01-03 HISTORY — DX: Adverse effect of unspecified anesthetic, initial encounter: T41.45XA

## 2015-01-03 HISTORY — DX: Unspecified asthma, uncomplicated: J45.909

## 2015-01-03 HISTORY — DX: Chronic obstructive pulmonary disease, unspecified: J44.9

## 2015-01-03 LAB — BASIC METABOLIC PANEL
Anion gap: 9 (ref 5–15)
BUN: 56 mg/dL — ABNORMAL HIGH (ref 6–20)
CO2: 32 mmol/L (ref 22–32)
Calcium: 9.4 mg/dL (ref 8.9–10.3)
Chloride: 95 mmol/L — ABNORMAL LOW (ref 101–111)
Creatinine, Ser: 10.65 mg/dL — ABNORMAL HIGH (ref 0.61–1.24)
GFR calc Af Amer: 5 mL/min — ABNORMAL LOW (ref >60)
GFR, EST NON AFRICAN AMERICAN: 5 mL/min — AB (ref >60)
Glucose, Bld: 88 mg/dL (ref 65–99)
Potassium: 4.8 mmol/L (ref 3.5–5.1)
Sodium: 136 mmol/L (ref 135–145)

## 2015-01-03 LAB — SURGICAL PCR SCREEN
MRSA, PCR: POSITIVE — AB
STAPHYLOCOCCUS AUREUS: POSITIVE — AB

## 2015-01-03 NOTE — Patient Instructions (Signed)
  Your procedure is scheduled on: Thursday 01/05/2015 Report to Day Surgery. 2nd floor Medical mall Entrance To find out your arrival time please call [phone] between 1PM - 3PM on Wednesday 01/04/2015.  Remember: Instructions that are not followed completely may result in serious medical risk, up to and including death, or upon the discretion of your surgeon and anesthesiologist your surgery may need to be rescheduled.    __x__ 1. Do not eat food or drink liquids after midnight. No gum chewing or hard candies.     __x__ 2. No Alcohol for 24 hours before or after surgery.   ____ 3. Bring all medications with you on the day of surgery if instructed.    __x__ 4. Notify your doctor if there is any change in your medical condition     (cold, fever, infections).     Do not wear jewelry, make-up, hairpins, clips or nail polish.  Do not wear lotions, powders, or perfumes.   Do not shave 48 hours prior to surgery. Men may shave face and neck.  Do not bring valuables to the hospital.    [facility] is not responsible for any belongings or valuables.               Contacts, dentures or bridgework may not be worn into surgery.  Leave your suitcase in the car. After surgery it may be brought to your room.  For patients admitted to the hospital, discharge time is determined by your                treatment team.   Patients discharged the day of surgery will not be allowed to drive home.   Please read over the following fact sheets that you were given:   MRSA Information and Surgical Site Infection Prevention   __x__ Take these medicines the morning of surgery with A SIP OF WATER:    1. PROTONIX  2. METOPROLOL  3. OXYCODONE AS NEEDED  4.  5.  6.  ____ Fleet Enema (as directed)   __X__ Use CHG Soap as directed  __X__ Use inhalers on the day of surgery  ____ Stop metformin 2 days prior to surgery    ____ Take 1/2 of usual insulin dose the night before surgery and none on the  morning of surgery.   ____ Stop Coumadin/Plavix/aspirin on   ____ Stop Anti-inflammatories on    ____ Stop supplements until after surgery.    ____ Bring C-Pap to the hospital.

## 2015-01-04 NOTE — OR Nursing (Signed)
Mickel Baas at Dr Lucky Cowboy office notified of +MRSA and +STAPH pcr screen. He is in surgery all day but she will notify him.

## 2015-01-04 NOTE — OR Nursing (Signed)
Mickel Baas at Dr. Lucky Cowboy office notified of anesthesia request for medical clearance for abnormal EKG.

## 2015-01-05 ENCOUNTER — Other Ambulatory Visit: Payer: Medicare Other

## 2015-01-05 ENCOUNTER — Inpatient Hospital Stay: Payer: Medicare Other | Admitting: Anesthesiology

## 2015-01-05 ENCOUNTER — Inpatient Hospital Stay
Admit: 2015-01-05 | Discharge: 2015-01-05 | Disposition: A | Discharge status: Home / Self Care | Payer: Medicare Other | Source: Ambulatory Visit | Attending: Vascular Surgery | Admitting: Vascular Surgery

## 2015-01-05 ENCOUNTER — Encounter
Admission: RE | Disposition: A | Discharge status: Skilled Nursing Facility | Payer: Self-pay | Source: Ambulatory Visit | Attending: Vascular Surgery

## 2015-01-05 ENCOUNTER — Encounter: Payer: Self-pay | Admitting: *Deleted

## 2015-01-05 ENCOUNTER — Ambulatory Visit
Admission: RE | Admit: 2015-01-05 | Discharge: 2015-01-05 | DRG: 255 | Disposition: A | Discharge status: Skilled Nursing Facility | Payer: Medicare Other | Source: Ambulatory Visit | Attending: Vascular Surgery | Admitting: Vascular Surgery

## 2015-01-05 DIAGNOSIS — F17210 Nicotine dependence, cigarettes, uncomplicated: Secondary | ICD-10-CM | POA: Diagnosis not present

## 2015-01-05 DIAGNOSIS — I2510 Atherosclerotic heart disease of native coronary artery without angina pectoris: Secondary | ICD-10-CM | POA: Diagnosis not present

## 2015-01-05 DIAGNOSIS — E785 Hyperlipidemia, unspecified: Secondary | ICD-10-CM | POA: Diagnosis present

## 2015-01-05 DIAGNOSIS — I120 Hypertensive chronic kidney disease with stage 5 chronic kidney disease or end stage renal disease: Secondary | ICD-10-CM | POA: Diagnosis present

## 2015-01-05 DIAGNOSIS — I70261 Atherosclerosis of native arteries of extremities with gangrene, right leg: Secondary | ICD-10-CM | POA: Diagnosis present

## 2015-01-05 DIAGNOSIS — N186 End stage renal disease: Secondary | ICD-10-CM | POA: Diagnosis present

## 2015-01-05 DIAGNOSIS — Z7952 Long term (current) use of systemic steroids: Secondary | ICD-10-CM | POA: Diagnosis not present

## 2015-01-05 DIAGNOSIS — I70235 Atherosclerosis of native arteries of right leg with ulceration of other part of foot: Secondary | ICD-10-CM | POA: Diagnosis not present

## 2015-01-05 DIAGNOSIS — L97519 Non-pressure chronic ulcer of other part of right foot with unspecified severity: Secondary | ICD-10-CM | POA: Diagnosis not present

## 2015-01-05 DIAGNOSIS — Z89512 Acquired absence of left leg below knee: Secondary | ICD-10-CM | POA: Diagnosis not present

## 2015-01-05 DIAGNOSIS — Z79899 Other long term (current) drug therapy: Secondary | ICD-10-CM

## 2015-01-05 DIAGNOSIS — Z7902 Long term (current) use of antithrombotics/antiplatelets: Secondary | ICD-10-CM | POA: Diagnosis not present

## 2015-01-05 LAB — CBC WITH DIFFERENTIAL/PLATELET
BASOS PCT: 1 %
Basophils Absolute: 0.1 10*3/uL (ref 0–0.1)
EOS ABS: 0.4 10*3/uL (ref 0–0.7)
Eosinophils Relative: 4 %
HCT: 32.8 % — ABNORMAL LOW (ref 40.0–52.0)
Hemoglobin: 10.5 g/dL — ABNORMAL LOW (ref 13.0–18.0)
Lymphocytes Relative: 27 %
Lymphs Abs: 2.2 10*3/uL (ref 1.0–3.6)
MCH: 30.5 pg (ref 26.0–34.0)
MCHC: 32.1 g/dL (ref 32.0–36.0)
MCV: 94.8 fL (ref 80.0–100.0)
MONO ABS: 1.2 10*3/uL — AB (ref 0.2–1.0)
Monocytes Relative: 15 %
NEUTROS ABS: 4.4 10*3/uL (ref 1.4–6.5)
NEUTROS PCT: 53 %
Platelets: 243 10*3/uL (ref 150–440)
RBC: 3.45 MIL/uL — ABNORMAL LOW (ref 4.40–5.90)
RDW: 15.1 % — ABNORMAL HIGH (ref 11.5–14.5)
WBC: 8.3 10*3/uL (ref 3.8–10.6)

## 2015-01-05 LAB — POTASSIUM: POTASSIUM, SERUM: 5.7

## 2015-01-05 LAB — GLUCOSE, CAPILLARY: GLUCOSE-CAPILLARY: 71 mg/dL (ref 65–99)

## 2015-01-05 SURGERY — AMPUTATION, FOOT, PARTIAL
Anesthesia: General | Laterality: Right

## 2015-01-05 MED ORDER — CEFAZOLIN SODIUM-DEXTROSE 2-3 GM-% IV SOLR
INTRAVENOUS | Status: AC
  Filled 2015-01-05: qty 50

## 2015-01-05 MED ORDER — VANCOMYCIN HCL IN DEXTROSE 1-5 GM/200ML-% IV SOLN
INTRAVENOUS | Status: AC
  Filled 2015-01-05: qty 200

## 2015-01-05 MED ORDER — VANCOMYCIN HCL IN DEXTROSE 1-5 GM/200ML-% IV SOLN: 1000.0000 mg | Freq: Once | INTRAVENOUS | Status: DC

## 2015-01-05 MED ORDER — SODIUM CHLORIDE 0.9 % IV SOLN
INTRAVENOUS | Status: DC
  Administered 2015-01-05: 15:00:00 via INTRAVENOUS

## 2015-01-05 MED ORDER — CEFAZOLIN SODIUM-DEXTROSE 2-3 GM-% IV SOLR: 2.0000 g | Freq: Once | INTRAVENOUS | Status: DC

## 2015-01-05 SURGICAL SUPPLY — 33 items
BANDAGE ELASTIC 6 CLIP NS LF (GAUZE/BANDAGES/DRESSINGS) IMPLANT
BLADE SAGITTAL WIDE XTHICK NO (BLADE) IMPLANT
BNDG COHESIVE 4X5 TAN STRL (GAUZE/BANDAGES/DRESSINGS) IMPLANT
BNDG GAUZE 4.5X4.1 6PLY STRL (MISCELLANEOUS) IMPLANT
BRUSH SCRUB 4% CHG (MISCELLANEOUS) IMPLANT
CANISTER SUCT 1200ML W/VALVE (MISCELLANEOUS) IMPLANT
DRAIN PENROSE 1/4X12 LTX (DRAIN) IMPLANT
DURAPREP 26ML APPLICATOR (WOUND CARE) IMPLANT
ELECT CAUTERY BLADE 6.4 (BLADE) IMPLANT
GAUZE PETRO XEROFOAM 1X8 (MISCELLANEOUS) IMPLANT
GLOVE BIO SURGEON STRL SZ7 (GLOVE) IMPLANT
GOWN STRL REUS W/ TWL LRG LVL3 (GOWN DISPOSABLE) IMPLANT
GOWN STRL REUS W/ TWL XL LVL3 (GOWN DISPOSABLE) IMPLANT
GOWN STRL REUS W/TWL LRG LVL3 (GOWN DISPOSABLE)
GOWN STRL REUS W/TWL XL LVL3 (GOWN DISPOSABLE)
HANDLE YANKAUER SUCT BULB TIP (MISCELLANEOUS) IMPLANT
KIT RM TURNOVER STRD PROC AR (KITS) IMPLANT
LABEL OR SOLS (LABEL) IMPLANT
NS IRRIG 1000ML POUR BTL (IV SOLUTION) IMPLANT
PACK EXTREMITY ARMC (MISCELLANEOUS) IMPLANT
PAD ABD DERMACEA PRESS 5X9 (GAUZE/BANDAGES/DRESSINGS) IMPLANT
PAD GROUND ADULT SPLIT (MISCELLANEOUS) IMPLANT
PAD PREP 24X41 OB/GYN DISP (PERSONAL CARE ITEMS) IMPLANT
SPONGE LAP 18X18 5 PK (GAUZE/BANDAGES/DRESSINGS) IMPLANT
STAPLER SKIN PROX 35W (STAPLE) IMPLANT
STOCKINETTE M/LG 89821 (MISCELLANEOUS) IMPLANT
SUT SILK 2 0 (SUTURE)
SUT SILK 2 0 SH (SUTURE) IMPLANT
SUT SILK 2-0 18XBRD TIE 12 (SUTURE) IMPLANT
SUT SILK 3 0 (SUTURE)
SUT SILK 3-0 18XBRD TIE 12 (SUTURE) IMPLANT
SUT VIC AB 0 CT1 36 (SUTURE) IMPLANT
SUT VIC AB 2-0 CT1 (SUTURE) IMPLANT

## 2015-01-05 NOTE — Progress Notes (Signed)
POCT K+   5.7     Dr Marcello Moores notified of same, advises he will discuss with Dr. Lucky Cowboy Dr Marcello Moores in to see pt and advises pt Dr Lucky Cowboy will be in to see him re K+ level

## 2015-01-05 NOTE — H&P (Signed)
[facility] History & Physical Update  The patient was interviewed and re-examined.  The patient's previous History and Physical has been reviewed and is unchanged.  The patient has gangrene of toes 2-5 on the foot and we will plan amputation of them today. We plan to proceed with the scheduled procedure.  [name],[name], MD  01/05/2015, 3:22 PM

## 2015-01-05 NOTE — Anesthesia Preprocedure Evaluation (Deleted)
Anesthesia Evaluation  Patient identified by MRN, date of birth, ID band Patient awake    Reviewed: Allergy & Precautions, NPO status , Patient's Chart, lab work & pertinent test results, reviewed documented beta blocker date and time   History of Anesthesia Complications (+) history of anesthetic complications  Airway Mallampati: II  TM Distance: >3 FB     Dental  (+) Chipped   Pulmonary asthma , COPDformer smoker,          Cardiovascular hypertension, + CAD and +CHF     Neuro/Psych    GI/Hepatic   Endo/Other  diabetes, Type 2  Renal/GU DialysisRenal disease     Musculoskeletal   Abdominal   Peds  Hematology  (+) anemia ,   Anesthesia Other Findings   Reproductive/Obstetrics                             Anesthesia Physical Anesthesia Plan  ASA: III  Anesthesia Plan: General   Post-op Pain Management:    Induction:   Airway Management Planned: LMA  Additional Equipment:   Intra-op Plan:   Post-operative Plan:   Informed Consent: I have reviewed the patients History and Physical, chart, labs and discussed the procedure including the risks, benefits and alternatives for the proposed anesthesia with the patient or authorized representative who has indicated his/her understanding and acceptance.     Plan Discussed with: CRNA  Anesthesia Plan Comments:         Anesthesia Quick Evaluation

## 2015-01-09 ENCOUNTER — Other Ambulatory Visit
Admission: RE | Admit: 2015-01-09 | Discharge: 2015-01-09 | Disposition: A | Discharge status: Home / Self Care | Payer: Medicare Other | Source: Other Acute Inpatient Hospital | Attending: Physician Assistant | Admitting: Physician Assistant

## 2015-01-09 DIAGNOSIS — E875 Hyperkalemia: Secondary | ICD-10-CM | POA: Diagnosis present

## 2015-01-09 LAB — POTASSIUM: Potassium: 3.8 mmol/L (ref 3.5–5.1)

## 2015-01-11 LAB — POTASSIUM: POTASSIUM: 5 mmol/L (ref 3.5–5.1)

## 2015-01-12 ENCOUNTER — Inpatient Hospital Stay
Admission: EM | Admit: 2015-01-12 | Discharge: 2015-01-14 | DRG: 864 | Disposition: A | Discharge status: Skilled Nursing Facility | Payer: Medicare Other | Attending: Internal Medicine | Admitting: Internal Medicine

## 2015-01-12 ENCOUNTER — Encounter: Payer: Self-pay | Admitting: *Deleted

## 2015-01-12 ENCOUNTER — Inpatient Hospital Stay: Payer: Medicare Other | Admitting: Anesthesiology

## 2015-01-12 ENCOUNTER — Ambulatory Visit
Admission: RE | Admit: 2015-01-12 | Discharge: 2015-01-12 | DRG: 255 | Disposition: A | Discharge status: Skilled Nursing Facility | Payer: Medicare Other | Source: Ambulatory Visit | Attending: Vascular Surgery | Admitting: Vascular Surgery

## 2015-01-12 ENCOUNTER — Emergency Department: Payer: Medicare Other

## 2015-01-12 ENCOUNTER — Encounter
Admission: RE | Disposition: A | Discharge status: Skilled Nursing Facility | Payer: Self-pay | Source: Ambulatory Visit | Attending: Vascular Surgery

## 2015-01-12 DIAGNOSIS — I120 Hypertensive chronic kidney disease with stage 5 chronic kidney disease or end stage renal disease: Secondary | ICD-10-CM | POA: Diagnosis present

## 2015-01-12 DIAGNOSIS — Z79891 Long term (current) use of opiate analgesic: Secondary | ICD-10-CM

## 2015-01-12 DIAGNOSIS — G9340 Encephalopathy, unspecified: Secondary | ICD-10-CM | POA: Diagnosis present

## 2015-01-12 DIAGNOSIS — Z833 Family history of diabetes mellitus: Secondary | ICD-10-CM

## 2015-01-12 DIAGNOSIS — I2510 Atherosclerotic heart disease of native coronary artery without angina pectoris: Secondary | ICD-10-CM | POA: Diagnosis present

## 2015-01-12 DIAGNOSIS — Z955 Presence of coronary angioplasty implant and graft: Secondary | ICD-10-CM

## 2015-01-12 DIAGNOSIS — Z7982 Long term (current) use of aspirin: Secondary | ICD-10-CM

## 2015-01-12 DIAGNOSIS — E213 Hyperparathyroidism, unspecified: Secondary | ICD-10-CM | POA: Diagnosis present

## 2015-01-12 DIAGNOSIS — N186 End stage renal disease: Secondary | ICD-10-CM | POA: Diagnosis present

## 2015-01-12 DIAGNOSIS — D649 Anemia, unspecified: Secondary | ICD-10-CM | POA: Diagnosis present

## 2015-01-12 DIAGNOSIS — I252 Old myocardial infarction: Secondary | ICD-10-CM

## 2015-01-12 DIAGNOSIS — Z89512 Acquired absence of left leg below knee: Secondary | ICD-10-CM

## 2015-01-12 DIAGNOSIS — E119 Type 2 diabetes mellitus without complications: Secondary | ICD-10-CM | POA: Diagnosis present

## 2015-01-12 DIAGNOSIS — Z79899 Other long term (current) drug therapy: Secondary | ICD-10-CM

## 2015-01-12 DIAGNOSIS — Z89411 Acquired absence of right great toe: Secondary | ICD-10-CM

## 2015-01-12 DIAGNOSIS — Z7952 Long term (current) use of systemic steroids: Secondary | ICD-10-CM

## 2015-01-12 DIAGNOSIS — A419 Sepsis, unspecified organism: Secondary | ICD-10-CM | POA: Diagnosis present

## 2015-01-12 DIAGNOSIS — E785 Hyperlipidemia, unspecified: Secondary | ICD-10-CM | POA: Diagnosis present

## 2015-01-12 DIAGNOSIS — Z992 Dependence on renal dialysis: Secondary | ICD-10-CM

## 2015-01-12 DIAGNOSIS — Z7951 Long term (current) use of inhaled steroids: Secondary | ICD-10-CM

## 2015-01-12 DIAGNOSIS — E875 Hyperkalemia: Secondary | ICD-10-CM | POA: Diagnosis not present

## 2015-01-12 DIAGNOSIS — Z89429 Acquired absence of other toe(s), unspecified side: Secondary | ICD-10-CM

## 2015-01-12 DIAGNOSIS — R509 Fever, unspecified: Secondary | ICD-10-CM

## 2015-01-12 DIAGNOSIS — R5082 Postprocedural fever: Principal | ICD-10-CM | POA: Diagnosis present

## 2015-01-12 DIAGNOSIS — N2581 Secondary hyperparathyroidism of renal origin: Secondary | ICD-10-CM | POA: Diagnosis present

## 2015-01-12 DIAGNOSIS — J45909 Unspecified asthma, uncomplicated: Secondary | ICD-10-CM | POA: Diagnosis present

## 2015-01-12 DIAGNOSIS — I10 Essential (primary) hypertension: Secondary | ICD-10-CM | POA: Diagnosis present

## 2015-01-12 DIAGNOSIS — Z89511 Acquired absence of right leg below knee: Secondary | ICD-10-CM

## 2015-01-12 DIAGNOSIS — D631 Anemia in chronic kidney disease: Secondary | ICD-10-CM | POA: Diagnosis present

## 2015-01-12 DIAGNOSIS — I5022 Chronic systolic (congestive) heart failure: Secondary | ICD-10-CM | POA: Diagnosis present

## 2015-01-12 DIAGNOSIS — J449 Chronic obstructive pulmonary disease, unspecified: Secondary | ICD-10-CM | POA: Diagnosis present

## 2015-01-12 DIAGNOSIS — Z8249 Family history of ischemic heart disease and other diseases of the circulatory system: Secondary | ICD-10-CM

## 2015-01-12 DIAGNOSIS — I509 Heart failure, unspecified: Secondary | ICD-10-CM | POA: Diagnosis present

## 2015-01-12 DIAGNOSIS — R4182 Altered mental status, unspecified: Secondary | ICD-10-CM

## 2015-01-12 DIAGNOSIS — Z791 Long term (current) use of non-steroidal anti-inflammatories (NSAID): Secondary | ICD-10-CM

## 2015-01-12 DIAGNOSIS — L97519 Non-pressure chronic ulcer of other part of right foot with unspecified severity: Secondary | ICD-10-CM | POA: Diagnosis present

## 2015-01-12 DIAGNOSIS — Z7902 Long term (current) use of antithrombotics/antiplatelets: Secondary | ICD-10-CM

## 2015-01-12 DIAGNOSIS — Z806 Family history of leukemia: Secondary | ICD-10-CM

## 2015-01-12 DIAGNOSIS — Z87891 Personal history of nicotine dependence: Secondary | ICD-10-CM

## 2015-01-12 DIAGNOSIS — I70261 Atherosclerosis of native arteries of extremities with gangrene, right leg: Secondary | ICD-10-CM | POA: Diagnosis present

## 2015-01-12 DIAGNOSIS — I739 Peripheral vascular disease, unspecified: Secondary | ICD-10-CM | POA: Diagnosis present

## 2015-01-12 DIAGNOSIS — F17210 Nicotine dependence, cigarettes, uncomplicated: Secondary | ICD-10-CM | POA: Diagnosis present

## 2015-01-12 DIAGNOSIS — Z89421 Acquired absence of other right toe(s): Secondary | ICD-10-CM

## 2015-01-12 HISTORY — PX: AMPUTATION: SHX166

## 2015-01-12 LAB — GLUCOSE, CAPILLARY
GLUCOSE-CAPILLARY: 58 mg/dL — AB (ref 65–99)
Glucose-Capillary: 64 mg/dL — ABNORMAL LOW (ref 65–99)
Glucose-Capillary: 82 mg/dL (ref 65–99)
Glucose-Capillary: 74 mg/dL (ref 65–99)
Glucose-Capillary: 66 mg/dL (ref 65–99)

## 2015-01-12 LAB — CBC WITH DIFFERENTIAL/PLATELET
BASOS ABS: 0 10*3/uL (ref 0–0.1)
BASOS PCT: 1 %
EOS ABS: 0.2 10*3/uL (ref 0–0.7)
Eosinophils Relative: 3 %
HCT: 34.4 % — ABNORMAL LOW (ref 40.0–52.0)
Hemoglobin: 11.3 g/dL — ABNORMAL LOW (ref 13.0–18.0)
Lymphocytes Relative: 5 %
Lymphs Abs: 0.3 10*3/uL — ABNORMAL LOW (ref 1.0–3.6)
MCH: 30.9 pg (ref 26.0–34.0)
MCHC: 32.7 g/dL (ref 32.0–36.0)
MCV: 94.6 fL (ref 80.0–100.0)
MONO ABS: 1 10*3/uL (ref 0.2–1.0)
MONOS PCT: 14 %
NEUTROS PCT: 77 %
Neutro Abs: 5.5 10*3/uL (ref 1.4–6.5)
Platelets: 184 10*3/uL (ref 150–440)
RBC: 3.64 MIL/uL — ABNORMAL LOW (ref 4.40–5.90)
RDW: 15.7 % — AB (ref 11.5–14.5)
WBC: 7.1 10*3/uL (ref 3.8–10.6)

## 2015-01-12 LAB — COMPREHENSIVE METABOLIC PANEL
ALK PHOS: 256 U/L — AB (ref 38–126)
ALT: 66 U/L — AB (ref 17–63)
AST: 89 U/L — ABNORMAL HIGH (ref 15–41)
Albumin: 3.1 g/dL — ABNORMAL LOW (ref 3.5–5.0)
Anion gap: 13 (ref 5–15)
BILIRUBIN TOTAL: 1 mg/dL (ref 0.3–1.2)
BUN: 50 mg/dL — ABNORMAL HIGH (ref 6–20)
CALCIUM: 8.5 mg/dL — AB (ref 8.9–10.3)
CO2: 23 mmol/L (ref 22–32)
CREATININE: 10.7 mg/dL — AB (ref 0.61–1.24)
Chloride: 96 mmol/L — ABNORMAL LOW (ref 101–111)
GFR calc non Af Amer: 5 mL/min — ABNORMAL LOW (ref >60)
GFR, EST AFRICAN AMERICAN: 5 mL/min — AB (ref >60)
GLUCOSE: 82 mg/dL (ref 65–99)
Potassium: 5.2 mmol/L — ABNORMAL HIGH (ref 3.5–5.1)
SODIUM: 132 mmol/L — AB (ref 135–145)
TOTAL PROTEIN: 8.1 g/dL (ref 6.5–8.1)

## 2015-01-12 LAB — TROPONIN I: TROPONIN I: 0.07 ng/mL — AB (ref <0.031)

## 2015-01-12 LAB — POTASSIUM: POTASSIUM: 5.2

## 2015-01-12 SURGERY — AMPUTATION, FOOT, PARTIAL
Anesthesia: General | Laterality: Right | Wound class: Dirty or Infected

## 2015-01-12 MED ORDER — FAMOTIDINE 20 MG PO TABS
ORAL_TABLET | ORAL | Status: DC
Start: 2015-01-12 — End: 2015-01-12
  Filled 2015-01-12: qty 1

## 2015-01-12 MED ORDER — CEFAZOLIN SODIUM-DEXTROSE 2-3 GM-% IV SOLR
INTRAVENOUS | Status: AC
  Filled 2015-01-12: qty 50

## 2015-01-12 MED ORDER — DEXTROSE 50 % IV SOLN
12.5000 g | Freq: Once | INTRAVENOUS | Status: AC
  Administered 2015-01-12: 12.5 g via INTRAVENOUS

## 2015-01-12 MED ORDER — SODIUM CHLORIDE 0.9 % IV SOLN
INTRAVENOUS | Status: DC
  Administered 2015-01-12: 12:00:00 via INTRAVENOUS

## 2015-01-12 MED ORDER — FENTANYL CITRATE (PF) 100 MCG/2ML IJ SOLN
INTRAMUSCULAR | Status: AC
  Administered 2015-01-12: 25 ug via INTRAVENOUS
  Filled 2015-01-12: qty 2

## 2015-01-12 MED ORDER — HYDROCODONE-ACETAMINOPHEN 5-325 MG PO TABS
1.0000 | ORAL_TABLET | Freq: Four times a day (QID) | ORAL | Status: DC | PRN
  Administered 2015-01-12: 1 via ORAL

## 2015-01-12 MED ORDER — PHENYLEPHRINE HCL 10 MG/ML IJ SOLN
INTRAMUSCULAR | Status: DC | PRN
  Administered 2015-01-12 (×2): 100 ug via INTRAVENOUS

## 2015-01-12 MED ORDER — PIPERACILLIN-TAZOBACTAM 3.375 G IVPB 30 MIN
3.3750 g | Freq: Once | INTRAVENOUS | Status: AC
  Administered 2015-01-12: 3.375 g via INTRAVENOUS

## 2015-01-12 MED ORDER — FENTANYL CITRATE (PF) 100 MCG/2ML IJ SOLN
50.0000 ug | Freq: Once | INTRAMUSCULAR | Status: AC
  Administered 2015-01-12: 50 ug via INTRAVENOUS
  Filled 2015-01-12: qty 2

## 2015-01-12 MED ORDER — VANCOMYCIN HCL IN DEXTROSE 1-5 GM/200ML-% IV SOLN
1000.0000 mg | Freq: Once | INTRAVENOUS | Status: AC
  Administered 2015-01-12: 1000 mg via INTRAVENOUS

## 2015-01-12 MED ORDER — FENTANYL CITRATE (PF) 100 MCG/2ML IJ SOLN
25.0000 ug | INTRAMUSCULAR | Status: AC | PRN
  Administered 2015-01-12: 100 ug via INTRAVENOUS
  Administered 2015-01-12 (×5): 25 ug via INTRAVENOUS

## 2015-01-12 MED ORDER — LIDOCAINE HCL (CARDIAC) 20 MG/ML IV SOLN
INTRAVENOUS | Status: DC | PRN
  Administered 2015-01-12: 100 mg via INTRAVENOUS

## 2015-01-12 MED ORDER — DEXTROSE 50 % IV SOLN
INTRAVENOUS | Status: AC
  Administered 2015-01-12: 12.5 g via INTRAVENOUS
  Filled 2015-01-12: qty 50

## 2015-01-12 MED ORDER — ONDANSETRON HCL 4 MG/2ML IJ SOLN: 4.0000 mg | Freq: Once | INTRAMUSCULAR | Status: DC | PRN

## 2015-01-12 MED ORDER — FENTANYL CITRATE (PF) 100 MCG/2ML IJ SOLN
100.0000 ug | Freq: Once | INTRAMUSCULAR | Status: AC
  Administered 2015-01-12: 100 ug via INTRAVENOUS
  Filled 2015-01-12: qty 2

## 2015-01-12 MED ORDER — DEXTROSE 50 % IV SOLN: 25.0000 mL | Freq: Once | INTRAVENOUS | Status: DC

## 2015-01-12 MED ORDER — VANCOMYCIN HCL IN DEXTROSE 1-5 GM/200ML-% IV SOLN
INTRAVENOUS | Status: AC
  Filled 2015-01-12: qty 200

## 2015-01-12 MED ORDER — PIPERACILLIN SOD-TAZOBACTAM SO 3.375 (3-0.375) G IV SOLR
INTRAVENOUS | Status: AC
  Filled 2015-01-12: qty 3.38

## 2015-01-12 MED ORDER — CEFAZOLIN SODIUM-DEXTROSE 2-3 GM-% IV SOLR: 2.0000 g | Freq: Once | INTRAVENOUS | Status: DC

## 2015-01-12 MED ORDER — VANCOMYCIN HCL IN DEXTROSE 1-5 GM/200ML-% IV SOLN
1000.0000 mg | Freq: Once | INTRAVENOUS | Status: AC
  Administered 2015-01-13: 1000 mg via INTRAVENOUS
  Filled 2015-01-12: qty 200

## 2015-01-12 MED ORDER — HYDROCODONE-ACETAMINOPHEN 5-325 MG PO TABS
ORAL_TABLET | ORAL | Status: DC
Start: 2015-01-12 — End: 2015-01-12
  Filled 2015-01-12: qty 1

## 2015-01-12 MED ORDER — FAMOTIDINE 20 MG PO TABS: 20.0000 mg | ORAL_TABLET | Freq: Once | ORAL | Status: DC

## 2015-01-12 MED ORDER — PROPOFOL 10 MG/ML IV BOLUS
INTRAVENOUS | Status: DC | PRN
  Administered 2015-01-12: 100 mg via INTRAVENOUS

## 2015-01-12 MED ORDER — MIDAZOLAM HCL 2 MG/2ML IJ SOLN
INTRAMUSCULAR | Status: DC | PRN
  Administered 2015-01-12: 2 mg via INTRAVENOUS

## 2015-01-12 SURGICAL SUPPLY — 35 items
BANDAGE ELASTIC 6 CLIP NS LF (GAUZE/BANDAGES/DRESSINGS) IMPLANT
BLADE MED AGGRESSIVE (BLADE) ×3 IMPLANT
BLADE SAGITTAL WIDE XTHICK NO (BLADE) IMPLANT
BNDG COHESIVE 4X5 TAN STRL (GAUZE/BANDAGES/DRESSINGS) ×3 IMPLANT
BNDG GAUZE 4.5X4.1 6PLY STRL (MISCELLANEOUS) ×6 IMPLANT
BRUSH SCRUB 4% CHG (MISCELLANEOUS) ×3 IMPLANT
CANISTER SUCT 1200ML W/VALVE (MISCELLANEOUS) ×3 IMPLANT
CHLORAPREP W/TINT 26ML (MISCELLANEOUS) ×3 IMPLANT
ELECT CAUTERY BLADE 6.4 (BLADE) ×3 IMPLANT
GAUZE PETRO XEROFOAM 1X8 (MISCELLANEOUS) ×3 IMPLANT
GLOVE BIO SURGEON STRL SZ7 (GLOVE) ×15 IMPLANT
GOWN STRL REUS W/ TWL LRG LVL3 (GOWN DISPOSABLE) ×1 IMPLANT
GOWN STRL REUS W/ TWL XL LVL3 (GOWN DISPOSABLE) ×1 IMPLANT
GOWN STRL REUS W/TWL LRG LVL3 (GOWN DISPOSABLE) ×2
GOWN STRL REUS W/TWL XL LVL3 (GOWN DISPOSABLE) ×2
HANDLE YANKAUER SUCT BULB TIP (MISCELLANEOUS) ×3 IMPLANT
KIT RM TURNOVER STRD PROC AR (KITS) ×3 IMPLANT
LABEL OR SOLS (LABEL) ×3 IMPLANT
NS IRRIG 1000ML POUR BTL (IV SOLUTION) ×3 IMPLANT
PACK EXTREMITY ARMC (MISCELLANEOUS) ×3 IMPLANT
PAD ABD DERMACEA PRESS 5X9 (GAUZE/BANDAGES/DRESSINGS) IMPLANT
PAD GROUND ADULT SPLIT (MISCELLANEOUS) ×3 IMPLANT
PAD PREP 24X41 OB/GYN DISP (PERSONAL CARE ITEMS) ×3 IMPLANT
SPONGE LAP 18X18 5 PK (GAUZE/BANDAGES/DRESSINGS) ×3 IMPLANT
STAPLER SKIN PROX 35W (STAPLE) IMPLANT
STOCKINETTE M/LG 89821 (MISCELLANEOUS) IMPLANT
SUT ETHILON 3-0 FS-10 30 BLK (SUTURE) ×6
SUT SILK 2 0 (SUTURE) ×2
SUT SILK 2 0 SH (SUTURE) ×3 IMPLANT
SUT SILK 2-0 18XBRD TIE 12 (SUTURE) ×1 IMPLANT
SUT SILK 3 0 (SUTURE) ×2
SUT SILK 3-0 18XBRD TIE 12 (SUTURE) ×1 IMPLANT
SUT VIC AB 0 CT1 36 (SUTURE) IMPLANT
SUT VIC AB 2-0 CT1 (SUTURE) ×6 IMPLANT
SUTURE EHLN 3-0 FS-10 30 BLK (SUTURE) ×2 IMPLANT

## 2015-01-12 NOTE — OR Nursing (Signed)
Ems here for transport to [facility]

## 2015-01-12 NOTE — OR Nursing (Signed)
Dr. Lucky Cowboy notified that patient is upset.  He wants to be admitted.  Dr. Lucky Cowboy spoke to family and patient and explained that there was no medical necessity for him to stay in the hospital  Non-emergency transport was arranged to transport him back to [facility].

## 2015-01-12 NOTE — OR Nursing (Signed)
Served a sandwich box but patient was sullen and refused to eat.

## 2015-01-12 NOTE — Anesthesia Procedure Notes (Signed)
Procedure Name: LMA Insertion Date/Time: 01/12/2015 1:30 PM Performed by: Nelda Marseille Pre-anesthesia Checklist: Patient identified, Patient being monitored, Timeout performed, Emergency Drugs available and Suction available Patient Re-evaluated:Patient Re-evaluated prior to inductionOxygen Delivery Method: Circle system utilized Preoxygenation: Pre-oxygenation with 100% oxygen Intubation Type: IV induction Ventilation: Mask ventilation without difficulty LMA: LMA inserted LMA Size: 4.5 Tube type: Oral Number of attempts: 1 Placement Confirmation: positive ETCO2 and breath sounds checked- equal and bilateral Tube secured with: Tape Dental Injury: Teeth and Oropharynx as per pre-operative assessment

## 2015-01-12 NOTE — Anesthesia Preprocedure Evaluation (Signed)
Anesthesia Evaluation  Patient identified by MRN, date of birth, ID band Patient awake    Reviewed: Allergy & Precautions, H&P , NPO status , Patient's Chart, lab work & pertinent test results, reviewed documented beta blocker date and time   History of Anesthesia Complications Negative for: history of anesthetic complications  Airway Mallampati: II  TM Distance: >3 FB Neck ROM: full    Dental no notable dental hx. (+) Poor Dentition   Pulmonary neg shortness of breath, asthma , neg sleep apnea, COPD COPD inhaler, neg recent URI, former smoker,  breath sounds clear to auscultation  Pulmonary exam normal       Cardiovascular Exercise Tolerance: Good hypertension, - angina+ CAD, + Past MI, + Cardiac Stents and +CHF - CABG negative cardio ROS Normal cardiovascular exam- Valvular Problems/MurmursRhythm:regular Rate:Normal     Neuro/Psych negative neurological ROS  negative psych ROS   GI/Hepatic negative GI ROS, Neg liver ROS,   Endo/Other  diabetes, Well Controlled  Renal/GU ESRF and DialysisRenal disease  negative genitourinary   Musculoskeletal   Abdominal   Peds  Hematology  (+) Blood dyscrasia, anemia ,   Anesthesia Other Findings Past Medical History:   End stage renal disease                                      HTN (hypertension)                                           HLD (hyperlipidemia)                                         Anemia                                                       Hyperparathyroidism                                          Diabetes mellitus without complication                       Dialysis patient                                               Comment:Mon, Wed, Fri   CHF (congestive heart failure)                               Asthma                                                       COPD (chronic obstructive pulmonary disease)  Complication of anesthesia                                      Comment:hypotension   Reproductive/Obstetrics negative OB ROS                             Anesthesia Physical Anesthesia Plan  ASA: IV  Anesthesia Plan: General   Post-op Pain Management:    Induction:   Airway Management Planned:   Additional Equipment:   Intra-op Plan:   Post-operative Plan:   Informed Consent: I have reviewed the patients History and Physical, chart, labs and discussed the procedure including the risks, benefits and alternatives for the proposed anesthesia with the patient or authorized representative who has indicated his/her understanding and acceptance.   Dental Advisory Given  Plan Discussed with: Anesthesiologist, CRNA and Surgeon  Anesthesia Plan Comments:         Anesthesia Quick Evaluation

## 2015-01-12 NOTE — OR Nursing (Signed)
Pt. Had protonix at the nursing home today so famotidine held.

## 2015-01-12 NOTE — ED Notes (Addendum)
Pt is currently a dialysis patient. Fistula located on right arm. Thrill felt and bruit heard by this RN. Pt is scheduled for next dialysis tomorrow.

## 2015-01-12 NOTE — Progress Notes (Signed)
Pt awakened oral airway removed intact.

## 2015-01-12 NOTE — H&P (Signed)
   [facility] History & Physical Update  The patient was interviewed and re-examined.  The patient's previous History and Physical has been reviewed and is unchanged.  There is no change in the plan of care. We plan to proceed with the scheduled procedure.  Delaney Perona, MD  01/12/2015, 1:01 PM

## 2015-01-12 NOTE — OR Nursing (Signed)
Dr. Royetta Asal anesthesia notified of FSBS of 57.  Patient is asymptomatic and eating well.

## 2015-01-12 NOTE — Progress Notes (Signed)
ANTIBIOTIC CONSULT NOTE - INITIAL  Pharmacy Consult for vancomycin and Zosyn Indication: rule out [name]  No Known Allergies  Patient Measurements: Height: 6\' 2"  (188 cm) Weight: 178 lb (80.74 kg) IBW/kg (Calculated) : 82.2 Adjusted Body Weight: 80.7 kg  Vital Signs: Temp: 101.3 F (38.5 C) (08/18 2240) Temp Source: Oral (08/18 2240) BP: 138/86 mmHg (08/18 2240) Pulse Rate: 101 (08/18 2240) Intake/Output from previous day:   Intake/Output from this shift:    Labs: No results for input(s): WBC, HGB, PLT, LABCREA, CREATININE in the last 72 hours. Estimated Creatinine Clearance: 8.4 mL/min (by C-G formula based on Cr of 10.65). No results for input(s): VANCOTROUGH, VANCOPEAK, VANCORANDOM, GENTTROUGH, GENTPEAK, GENTRANDOM, TOBRATROUGH, TOBRAPEAK, TOBRARND, AMIKACINPEAK, AMIKACINTROU, AMIKACIN in the last 72 hours.   Microbiology: Recent Results (from the past 720 hour(s))  Surgical pcr screen     Status: Abnormal   Collection Time: 01/03/15  2:30 PM  Result Value Ref Range Status   MRSA, PCR POSITIVE (A) NEGATIVE Final   Staphylococcus aureus POSITIVE (A) NEGATIVE Final    Comment: CRITICAL VALUE CALLED BY TB AT 1823 01/03/15 TO MICHELLE HANKINS    Medical History: Past Medical History  Diagnosis Date  . End stage renal disease   . HTN (hypertension)   . HLD (hyperlipidemia)   . Anemia   . Hyperparathyroidism   . Diabetes mellitus without complication   . Dialysis patient     Mon, Wed, Fri  . CHF (congestive heart failure)   . Asthma   . COPD (chronic obstructive pulmonary disease)   . Complication of anesthesia     hypotension    Medications:  Infusions:  . piperacillin-tazobactam    . vancomycin     Assessment: [name]. 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 Receiving 1 dose of vancomycin and 1 dose of Zosyn over 30 minutes.  Goal of Therapy:  Vancomycin trough level 15-20 mcg/ml  Plan:  Expected  duration 10 days with resolution of temperature and/or normalization of WBC. Ordered vancomycin trough for AM labs - 1 gm dose is appropriate and will dose by levels in this dialysis patient. Admission order for Zosyn 3.375 gm IV Q12H EI to start 12 hours after ED dose. Will continue to follow and adjust as needed to maintain vancomycin trough 15 to 20 mcg/mL.   Laural Benes, Pharm.D. Clinical Pharmacist 01/12/2015,11:32 PM

## 2015-01-12 NOTE — ED Notes (Signed)
Pt arrived to ED via EMS from [facility] reporting right foot pain. Pt is post op from three toe amputation earlier today. Pt reports pain at surgical site. Pt denies memory of pain medication being offered at [facility] and denies having received pain medication since surgery. Pt reported to have been lethargic and altered mental status by [facility] staff. Upon arrival pt is moaning in pain and able to talk without difficulty. Pt is oriented to name and situation but disoriented to time and place.

## 2015-01-12 NOTE — Anesthesia Postprocedure Evaluation (Signed)
  Anesthesia Post-op Note  Patient: [name]  Procedure(s) Performed: Procedure(s): Foot transmetatarsal amputation (Right)  Anesthesia type:General  Patient location: PACU  Post pain: Pain level controlled  Post assessment: Post-op Vital signs reviewed, Patient's Cardiovascular Status Stable, Respiratory Function Stable, Patent Airway and No signs of Nausea or vomiting  Post vital signs: Reviewed and stable  Last Vitals:  Filed Vitals:   01/12/15 1603  BP: 120/66  Pulse: 67  Temp: 36.2 C  Resp: 16    Level of consciousness: awake, alert  and patient cooperative  Complications: No apparent anesthesia complications

## 2015-01-12 NOTE — Progress Notes (Signed)
Pt arrives from OR with oral airway intact.

## 2015-01-12 NOTE — Transfer of Care (Signed)
Immediate Anesthesia Transfer of Care Note  Patient: [name]  Procedure(s) Performed: Procedure(s): Foot transmetatarsal amputation (Right)  Patient Location: PACU  Anesthesia Type:General  Level of Consciousness: sedated  Airway & Oxygen Therapy: Patient Spontanous Breathing and Patient connected to face mask oxygen  Post-op Assessment: Report given to RN and Post -op Vital signs reviewed and stable  Post vital signs: Reviewed and stable  Last Vitals:  Filed Vitals:   01/12/15 1151  BP: 137/76  Pulse: 62  Temp: 36.8 C  Resp: 16    Complications: No apparent anesthesia complications

## 2015-01-12 NOTE — Op Note (Signed)
[facility]   OPERATIVE NOTE  DATE: 01/12/2015  PRE-OPERATIVE DIAGNOSIS: gangrene right foot, PAD, ESRD  POST-OPERATIVE DIAGNOSIS: same as above  PROCEDURE: 1.   Amputation of first, second, third, and fourth toe and metatarsal amputation.  SURGEON: Kacie Huxtable  ASSISTANT(S): Hezzie Bump, PA-C  ANESTHESIA: general  ESTIMATED BLOOD LOSS: 25 cc  FINDING(S): 1.  none  SPECIMEN(S):  Amputated tissue as above  INDICATIONS:   [name] is a 59 y.o. male who presents with gangrene of right forefoot gangrene.  He has ESRD and PAD s/p revascularization.  He has pain with his gangrenous tissue and this will be removed.  Risks and benefits discussed and informed consent obtained.  DESCRIPTION: After obtaining full informed written consent, the patient was brought back to the operating room and placed supine upon the operating table.  The patient received IV antibiotics prior to induction.  After obtaining adequate anesthesia, the patient was prepped and draped in the standard fashion. An incision was created in the standard fashion around the nonviable toes leaving the great toe intact as much as possible as it was not gangrenous. There was a wound at the medial surface of the great toe, and this was ellipsed out with our incision. The incision was taken down to about the level of the metatarsal heads. I then dissected through the soft tissue and dissected the metatarsals out several centimeters beyond their distal termination and the bones were transected with the TPS saw. Ronjours were used to remove all bone back well beyond the incision.  There was brisk bleeding and all tissue appeared viable. Heavy bone cutters were used to transect the fifth metatarsal had a few centimeters back beyond the skin edge proximally. All toes and metatarsals were removed and sent as specimen. The wound was then copiously irrigated. Subcutaneous tissue was closed with interrupted  figure-of-eight 2-0 Vicryl sutures. The skin was closed as much as possible with mattress 3-0 nylon sutures. The most medial portion at the base of the toe remained about 1 cm separated with a clean wound for about 3-4 cm. Sterile dressings were then placed.  COMPLICATIONS: None  CONDITION: Stable  Shawneen Deetz  01/12/2015, 2:44 PM

## 2015-01-12 NOTE — Discharge Instructions (Addendum)
AMBULATORY SURGERY  DISCHARGE INSTRUCTIONS   1) The drugs that you were given will stay in your system until tomorrow so for the next 24 hours you should not:  A) Drive an automobile B) Make any legal decisions C) Drink any alcoholic beverage   2) You may resume regular meals tomorrow.  Today it is better to start with liquids and gradually work up to solid foods.  You may eat anything you prefer, but it is better to start with liquids, then soup and crackers, and gradually work up to solid foods.   3) Please notify your doctor immediately if you have any unusual bleeding, trouble breathing, redness and pain at the surgery site, drainage, fever, or pain not relieved by medication.    4) Additional Instructions:  Clean and change dressing every 2 days.  Call Dr. Lucky Cowboy with problems. Feed patient a light meal upon arrival to [facility].  Blood sugar 66 at 4:36  Please contact your physician with any problems or Same Day Surgery at [phone], Monday through Friday 6 am to 4 pm, or [place] at [facility] number at [phone].

## 2015-01-12 NOTE — Progress Notes (Signed)
Pt given 51mcg Fentanyl IV as ordered.

## 2015-01-13 ENCOUNTER — Encounter: Payer: Self-pay | Admitting: Internal Medicine

## 2015-01-13 DIAGNOSIS — J449 Chronic obstructive pulmonary disease, unspecified: Secondary | ICD-10-CM | POA: Diagnosis present

## 2015-01-13 DIAGNOSIS — I5022 Chronic systolic (congestive) heart failure: Secondary | ICD-10-CM | POA: Diagnosis present

## 2015-01-13 DIAGNOSIS — E213 Hyperparathyroidism, unspecified: Secondary | ICD-10-CM | POA: Diagnosis present

## 2015-01-13 DIAGNOSIS — I120 Hypertensive chronic kidney disease with stage 5 chronic kidney disease or end stage renal disease: Secondary | ICD-10-CM | POA: Diagnosis present

## 2015-01-13 DIAGNOSIS — E875 Hyperkalemia: Secondary | ICD-10-CM | POA: Diagnosis present

## 2015-01-13 DIAGNOSIS — Z8249 Family history of ischemic heart disease and other diseases of the circulatory system: Secondary | ICD-10-CM | POA: Diagnosis not present

## 2015-01-13 DIAGNOSIS — Z79899 Other long term (current) drug therapy: Secondary | ICD-10-CM | POA: Diagnosis not present

## 2015-01-13 DIAGNOSIS — Z79891 Long term (current) use of opiate analgesic: Secondary | ICD-10-CM | POA: Diagnosis not present

## 2015-01-13 DIAGNOSIS — Z89512 Acquired absence of left leg below knee: Secondary | ICD-10-CM | POA: Diagnosis not present

## 2015-01-13 DIAGNOSIS — D631 Anemia in chronic kidney disease: Secondary | ICD-10-CM | POA: Diagnosis present

## 2015-01-13 DIAGNOSIS — N2581 Secondary hyperparathyroidism of renal origin: Secondary | ICD-10-CM | POA: Diagnosis present

## 2015-01-13 DIAGNOSIS — A419 Sepsis, unspecified organism: Secondary | ICD-10-CM | POA: Diagnosis present

## 2015-01-13 DIAGNOSIS — N186 End stage renal disease: Secondary | ICD-10-CM | POA: Diagnosis present

## 2015-01-13 DIAGNOSIS — E785 Hyperlipidemia, unspecified: Secondary | ICD-10-CM | POA: Diagnosis present

## 2015-01-13 DIAGNOSIS — Z833 Family history of diabetes mellitus: Secondary | ICD-10-CM | POA: Diagnosis not present

## 2015-01-13 DIAGNOSIS — G9340 Encephalopathy, unspecified: Secondary | ICD-10-CM | POA: Diagnosis present

## 2015-01-13 DIAGNOSIS — I739 Peripheral vascular disease, unspecified: Secondary | ICD-10-CM | POA: Diagnosis present

## 2015-01-13 DIAGNOSIS — Z955 Presence of coronary angioplasty implant and graft: Secondary | ICD-10-CM | POA: Diagnosis not present

## 2015-01-13 DIAGNOSIS — Z791 Long term (current) use of non-steroidal anti-inflammatories (NSAID): Secondary | ICD-10-CM | POA: Diagnosis not present

## 2015-01-13 DIAGNOSIS — Z992 Dependence on renal dialysis: Secondary | ICD-10-CM | POA: Diagnosis not present

## 2015-01-13 DIAGNOSIS — I2510 Atherosclerotic heart disease of native coronary artery without angina pectoris: Secondary | ICD-10-CM | POA: Diagnosis present

## 2015-01-13 DIAGNOSIS — Z7982 Long term (current) use of aspirin: Secondary | ICD-10-CM | POA: Diagnosis not present

## 2015-01-13 DIAGNOSIS — J45909 Unspecified asthma, uncomplicated: Secondary | ICD-10-CM | POA: Diagnosis present

## 2015-01-13 DIAGNOSIS — Z87891 Personal history of nicotine dependence: Secondary | ICD-10-CM | POA: Diagnosis not present

## 2015-01-13 DIAGNOSIS — Z89429 Acquired absence of other toe(s), unspecified side: Secondary | ICD-10-CM | POA: Diagnosis not present

## 2015-01-13 DIAGNOSIS — Z806 Family history of leukemia: Secondary | ICD-10-CM | POA: Diagnosis not present

## 2015-01-13 DIAGNOSIS — R5082 Postprocedural fever: Secondary | ICD-10-CM | POA: Diagnosis present

## 2015-01-13 DIAGNOSIS — Z89421 Acquired absence of other right toe(s): Secondary | ICD-10-CM | POA: Diagnosis not present

## 2015-01-13 DIAGNOSIS — Z89411 Acquired absence of right great toe: Secondary | ICD-10-CM | POA: Diagnosis not present

## 2015-01-13 LAB — COMPREHENSIVE METABOLIC PANEL
ALT: 62 U/L (ref 17–63)
AST: 86 U/L — ABNORMAL HIGH (ref 15–41)
Albumin: 2.8 g/dL — ABNORMAL LOW (ref 3.5–5.0)
Alkaline Phosphatase: 216 U/L — ABNORMAL HIGH (ref 38–126)
Anion gap: 8 (ref 5–15)
BUN: 55 mg/dL — ABNORMAL HIGH (ref 6–20)
CALCIUM: 8.4 mg/dL — AB (ref 8.9–10.3)
CHLORIDE: 97 mmol/L — AB (ref 101–111)
CO2: 29 mmol/L (ref 22–32)
Creatinine, Ser: 11.46 mg/dL — ABNORMAL HIGH (ref 0.61–1.24)
GFR, EST AFRICAN AMERICAN: 5 mL/min — AB (ref >60)
GFR, EST NON AFRICAN AMERICAN: 4 mL/min — AB (ref >60)
Glucose, Bld: 109 mg/dL — ABNORMAL HIGH (ref 65–99)
Potassium: 4.9 mmol/L (ref 3.5–5.1)
SODIUM: 134 mmol/L — AB (ref 135–145)
Total Bilirubin: 0.9 mg/dL (ref 0.3–1.2)
Total Protein: 7.1 g/dL (ref 6.5–8.1)

## 2015-01-13 LAB — CBC
HCT: 31 % — ABNORMAL LOW (ref 40.0–52.0)
HEMATOCRIT: 31.7 % — AB (ref 40.0–52.0)
HEMOGLOBIN: 10.4 g/dL — AB (ref 13.0–18.0)
HEMOGLOBIN: 10.3 g/dL — AB (ref 13.0–18.0)
MCH: 30.8 pg (ref 26.0–34.0)
MCH: 31.7 pg (ref 26.0–34.0)
MCHC: 32.6 g/dL (ref 32.0–36.0)
MCHC: 33.3 g/dL (ref 32.0–36.0)
MCV: 94.3 fL (ref 80.0–100.0)
MCV: 95 fL (ref 80.0–100.0)
PLATELETS: 164 10*3/uL (ref 150–440)
Platelets: 167 10*3/uL (ref 150–440)
RBC: 3.36 MIL/uL — AB (ref 4.40–5.90)
RBC: 3.27 MIL/uL — AB (ref 4.40–5.90)
RDW: 15.2 % — ABNORMAL HIGH (ref 11.5–14.5)
RDW: 15.7 % — ABNORMAL HIGH (ref 11.5–14.5)
WBC: 6.8 10*3/uL (ref 3.8–10.6)
WBC: 5.8 10*3/uL (ref 3.8–10.6)

## 2015-01-13 LAB — TROPONIN I
TROPONIN I: 0.07 ng/mL — AB (ref <0.031)
TROPONIN I: 0.08 ng/mL — AB (ref <0.031)
Troponin I: 0.07 ng/mL — ABNORMAL HIGH (ref <0.031)

## 2015-01-13 LAB — CREATININE, SERUM
Creatinine, Ser: 11 mg/dL — ABNORMAL HIGH (ref 0.61–1.24)
GFR, EST AFRICAN AMERICAN: 5 mL/min — AB (ref >60)
GFR, EST NON AFRICAN AMERICAN: 4 mL/min — AB (ref >60)

## 2015-01-13 LAB — PHOSPHORUS: PHOSPHORUS: 5 mg/dL — AB (ref 2.5–4.6)

## 2015-01-13 LAB — VANCOMYCIN, TROUGH: VANCOMYCIN TR: 34 ug/mL — AB (ref 10–20)

## 2015-01-13 LAB — LACTIC ACID, PLASMA: LACTIC ACID, VENOUS: 1 mmol/L (ref 0.5–2.0)

## 2015-01-13 MED ORDER — DOCUSATE SODIUM 100 MG PO CAPS
100.0000 mg | ORAL_CAPSULE | Freq: Two times a day (BID) | ORAL | Status: DC
  Administered 2015-01-13 – 2015-01-14 (×3): 100 mg via ORAL
  Filled 2015-01-13 (×3): qty 1

## 2015-01-13 MED ORDER — CALCIUM ACETATE (PHOS BINDER) 667 MG PO CAPS
667.0000 mg | ORAL_CAPSULE | Freq: Three times a day (TID) | ORAL | Status: DC
  Administered 2015-01-13 – 2015-01-14 (×3): 667 mg via ORAL
  Filled 2015-01-13 (×3): qty 1

## 2015-01-13 MED ORDER — LIDOCAINE HCL (PF) 1 % IJ SOLN: 5.0000 mL | INTRAMUSCULAR | Status: DC | PRN

## 2015-01-13 MED ORDER — VANCOMYCIN HCL IN DEXTROSE 750-5 MG/150ML-% IV SOLN
750.0000 mg | INTRAVENOUS | Status: DC
  Administered 2015-01-13: 750 mg via INTRAVENOUS
  Filled 2015-01-13: qty 150

## 2015-01-13 MED ORDER — ALTEPLASE 2 MG IJ SOLR: 2.0000 mg | Freq: Once | INTRAMUSCULAR | Status: DC | PRN

## 2015-01-13 MED ORDER — OXYCODONE HCL 5 MG PO TABS
5.0000 mg | ORAL_TABLET | ORAL | Status: DC | PRN
  Administered 2015-01-13: 10 mg via ORAL
  Filled 2015-01-13: qty 1
  Filled 2015-01-13: qty 10

## 2015-01-13 MED ORDER — SODIUM CHLORIDE 0.9 % IJ SOLN
3.0000 mL | INTRAMUSCULAR | Status: DC | PRN
  Administered 2015-01-13 (×2): 3 mL via INTRAVENOUS
  Filled 2015-01-13 (×2): qty 10

## 2015-01-13 MED ORDER — RENA-VITE PO TABS
1.0000 | ORAL_TABLET | Freq: Every day | ORAL | Status: DC
  Administered 2015-01-13: 1 via ORAL
  Filled 2015-01-13: qty 1

## 2015-01-13 MED ORDER — FENTANYL CITRATE (PF) 100 MCG/2ML IJ SOLN: 100.0000 ug | Freq: Once | INTRAMUSCULAR | Status: DC

## 2015-01-13 MED ORDER — NEPRO/CARBSTEADY PO LIQD
237.0000 mL | Freq: Every day | ORAL | Status: DC
  Administered 2015-01-13: 237 mL via ORAL

## 2015-01-13 MED ORDER — ACETAMINOPHEN 325 MG PO TABS
650.0000 mg | ORAL_TABLET | Freq: Once | ORAL | Status: AC
  Administered 2015-01-13: 650 mg via ORAL
  Filled 2015-01-13: qty 2

## 2015-01-13 MED ORDER — SODIUM CHLORIDE 0.9 % IJ SOLN: 3.0000 mL | Freq: Two times a day (BID) | INTRAMUSCULAR | Status: DC

## 2015-01-13 MED ORDER — ONDANSETRON HCL 4 MG/2ML IJ SOLN: 4.0000 mg | Freq: Four times a day (QID) | INTRAMUSCULAR | Status: DC | PRN

## 2015-01-13 MED ORDER — SODIUM CHLORIDE 0.9 % IV SOLN: 100.0000 mL | INTRAVENOUS | Status: DC | PRN

## 2015-01-13 MED ORDER — HEPARIN SODIUM (PORCINE) 1000 UNIT/ML DIALYSIS: 1000.0000 [IU] | INTRAMUSCULAR | Status: DC | PRN

## 2015-01-13 MED ORDER — TIOTROPIUM BROMIDE MONOHYDRATE 18 MCG IN CAPS
18.0000 ug | ORAL_CAPSULE | Freq: Every day | RESPIRATORY_TRACT | Status: DC
  Administered 2015-01-13 – 2015-01-14 (×2): 18 ug via RESPIRATORY_TRACT
  Filled 2015-01-13: qty 5

## 2015-01-13 MED ORDER — LIDOCAINE-PRILOCAINE 2.5-2.5 % EX CREA
1.0000 | TOPICAL_CREAM | CUTANEOUS | Status: DC | PRN
Start: 2015-01-13 — End: 2015-01-14

## 2015-01-13 MED ORDER — ASPIRIN 325 MG PO TABS
325.0000 mg | ORAL_TABLET | Freq: Every day | ORAL | Status: DC
  Administered 2015-01-13 – 2015-01-14 (×2): 325 mg via ORAL
  Filled 2015-01-13 (×2): qty 1

## 2015-01-13 MED ORDER — SIMVASTATIN 10 MG PO TABS: 10.0000 mg | ORAL_TABLET | Freq: Every evening | ORAL | Status: DC

## 2015-01-13 MED ORDER — SODIUM POLYSTYRENE SULFONATE 15 GM/60ML PO SUSP
15.0000 g | Freq: Once | ORAL | Status: AC
  Administered 2015-01-13: 15 g via ORAL
  Filled 2015-01-13: qty 60

## 2015-01-13 MED ORDER — HEPARIN SODIUM (PORCINE) 5000 UNIT/ML IJ SOLN
5000.0000 [IU] | Freq: Three times a day (TID) | INTRAMUSCULAR | Status: DC
  Administered 2015-01-13: 5000 [IU] via SUBCUTANEOUS
  Filled 2015-01-13 (×2): qty 1

## 2015-01-13 MED ORDER — MORPHINE SULFATE (PF) 2 MG/ML IV SOLN
2.0000 mg | INTRAVENOUS | Status: DC | PRN
  Administered 2015-01-13 (×3): 2 mg via INTRAVENOUS
  Filled 2015-01-13 (×3): qty 1

## 2015-01-13 MED ORDER — FENTANYL CITRATE (PF) 100 MCG/2ML IJ SOLN
50.0000 ug | Freq: Once | INTRAMUSCULAR | Status: AC
  Administered 2015-01-13: 50 ug via INTRAVENOUS

## 2015-01-13 MED ORDER — ONDANSETRON HCL 4 MG PO TABS: 4.0000 mg | ORAL_TABLET | Freq: Four times a day (QID) | ORAL | Status: DC | PRN

## 2015-01-13 MED ORDER — IPRATROPIUM-ALBUTEROL 0.5-2.5 (3) MG/3ML IN SOLN
3.0000 mL | Freq: Four times a day (QID) | RESPIRATORY_TRACT | Status: DC
  Administered 2015-01-13 (×2): 3 mL via RESPIRATORY_TRACT
  Filled 2015-01-13 (×3): qty 3

## 2015-01-13 MED ORDER — METOPROLOL TARTRATE 25 MG PO TABS
25.0000 mg | ORAL_TABLET | Freq: Two times a day (BID) | ORAL | Status: DC
  Administered 2015-01-13 – 2015-01-14 (×2): 25 mg via ORAL
  Filled 2015-01-13 (×3): qty 1

## 2015-01-13 MED ORDER — SENNA 8.6 MG PO TABS
1.0000 | ORAL_TABLET | Freq: Every day | ORAL | Status: DC
  Administered 2015-01-14: 8.6 mg via ORAL
  Filled 2015-01-13: qty 1

## 2015-01-13 MED ORDER — SEVELAMER CARBONATE 800 MG PO TABS
800.0000 mg | ORAL_TABLET | Freq: Three times a day (TID) | ORAL | Status: DC
  Administered 2015-01-13 – 2015-01-14 (×3): 800 mg via ORAL
  Filled 2015-01-13 (×3): qty 1

## 2015-01-13 MED ORDER — FENTANYL CITRATE (PF) 100 MCG/2ML IJ SOLN
INTRAMUSCULAR | Status: AC
  Filled 2015-01-13: qty 2

## 2015-01-13 MED ORDER — NEPRO/CARBSTEADY PO LIQD
237.0000 mL | Freq: Two times a day (BID) | ORAL | Status: DC
  Administered 2015-01-14: 237 mL via ORAL

## 2015-01-13 MED ORDER — GABAPENTIN 300 MG PO CAPS
300.0000 mg | ORAL_CAPSULE | Freq: Every day | ORAL | Status: DC
  Administered 2015-01-13: 300 mg via ORAL
  Filled 2015-01-13: qty 1

## 2015-01-13 MED ORDER — NICOTINE 21 MG/24HR TD PT24
21.0000 mg | MEDICATED_PATCH | Freq: Every day | TRANSDERMAL | Status: DC
  Administered 2015-01-14: 21 mg via TRANSDERMAL
  Filled 2015-01-13 (×2): qty 1

## 2015-01-13 MED ORDER — ONDANSETRON HCL 4 MG PO TABS
4.0000 mg | ORAL_TABLET | Freq: Four times a day (QID) | ORAL | Status: DC | PRN
Start: 2015-01-13 — End: 2015-01-14

## 2015-01-13 MED ORDER — SODIUM CHLORIDE 0.9 % IV SOLN: 125.0000 mg | INTRAVENOUS | Status: DC

## 2015-01-13 MED ORDER — NEPRO/CARBSTEADY PO LIQD: 237.0000 mL | ORAL | Status: DC | PRN

## 2015-01-13 MED ORDER — ACETAMINOPHEN 500 MG PO TABS
1000.0000 mg | ORAL_TABLET | Freq: Four times a day (QID) | ORAL | Status: DC | PRN
  Administered 2015-01-14: 1000 mg via ORAL
  Filled 2015-01-13 (×2): qty 2

## 2015-01-13 MED ORDER — PENTAFLUOROPROP-TETRAFLUOROETH EX AERO: 1.0000 "application " | INHALATION_SPRAY | CUTANEOUS | Status: DC | PRN

## 2015-01-13 MED ORDER — CHLORHEXIDINE GLUCONATE CLOTH 2 % EX PADS
6.0000 | MEDICATED_PAD | Freq: Every day | CUTANEOUS | Status: DC
  Administered 2015-01-13: 6 via TOPICAL

## 2015-01-13 MED ORDER — DOXERCALCIFEROL 4 MCG/2ML IV SOLN: 2.5000 ug | INTRAVENOUS | Status: DC

## 2015-01-13 MED ORDER — PANTOPRAZOLE SODIUM 40 MG PO TBEC
40.0000 mg | DELAYED_RELEASE_TABLET | Freq: Every day | ORAL | Status: DC
  Administered 2015-01-13 – 2015-01-14 (×2): 40 mg via ORAL
  Filled 2015-01-13 (×2): qty 1

## 2015-01-13 MED ORDER — BUDESONIDE-FORMOTEROL FUMARATE 160-4.5 MCG/ACT IN AERO
2.0000 | INHALATION_SPRAY | Freq: Two times a day (BID) | RESPIRATORY_TRACT | Status: DC
  Administered 2015-01-13 – 2015-01-14 (×3): 2 via RESPIRATORY_TRACT
  Filled 2015-01-13: qty 6

## 2015-01-13 MED ORDER — PIPERACILLIN-TAZOBACTAM 3.375 G IVPB
3.3750 g | Freq: Two times a day (BID) | INTRAVENOUS | Status: DC
  Administered 2015-01-13 (×2): 3.375 g via INTRAVENOUS
  Filled 2015-01-13 (×5): qty 50

## 2015-01-13 MED ORDER — MUPIROCIN 2 % EX OINT
1.0000 "application " | TOPICAL_OINTMENT | Freq: Two times a day (BID) | CUTANEOUS | Status: DC
  Administered 2015-01-13 – 2015-01-14 (×3): 1 via NASAL
  Filled 2015-01-13: qty 22

## 2015-01-13 NOTE — Progress Notes (Signed)
ANTIBIOTIC CONSULT NOTE  Pharmacy Consult for vancomycin and Zosyn Indication: rule out sepsis  No Known Allergies  Patient Measurements: Height: 6\' 2"  (188 cm) Weight: 172 lb 3.2 oz (78.109 kg) IBW/kg (Calculated) : 82.2 Adjusted Body Weight: 80.7 kg  Vital Signs: Temp: 98.2 F (36.8 C) (08/19 0548) Temp Source: Oral (08/19 0548) BP: 109/68 mmHg (08/19 0548) Pulse Rate: 70 (08/19 0548) Intake/Output from previous day:   Intake/Output from this shift:    Labs:  Recent Labs  01/12/15 2250 01/13/15 0153 01/13/15 0503  WBC 7.1 6.8 5.8  HGB 11.3* 10.4* 10.3*  PLT 184 167 164  CREATININE 10.70* 11.00* 11.46*   Estimated Creatinine Clearance: 7.6 mL/min (by C-G formula based on Cr of 11.46).  Recent Labs  01/13/15 0503  VANCOTROUGH 34*   * 8/19 -drawn after loading dose given, no HD or maintenance doses given  Microbiology: Recent Results (from the past 720 hour(s))  Surgical pcr screen     Status: Abnormal   Collection Time: 01/03/15  2:30 PM  Result Value Ref Range Status   MRSA, PCR POSITIVE (A) NEGATIVE Final   Staphylococcus aureus POSITIVE (A) NEGATIVE Final    Comment: CRITICAL VALUE CALLED BY TB AT 1823 01/03/15 TO MICHELLE HANKINS    Medical History: Past Medical History  Diagnosis Date  . End stage renal disease   . HTN (hypertension)   . HLD (hyperlipidemia)   . Anemia   . Hyperparathyroidism   . Dialysis patient     Mon, Wed, Fri  . CHF (congestive heart failure)   . Asthma   . COPD (chronic obstructive pulmonary disease)   . Complication of anesthesia     hypotension    Assessment: 61 yom with ESRD on HD MWF sp right foot transmetatarsal amputation starting antibiotics for sepsis.   Patient admitted via ED due to AMS after amputation yesterday morning. Received vancomycin 1gm preoperatively and 1gm given in ED along with zosyn 3.375gm  X 1  Goal of Therapy:  Pre-HD vancomycin trough of 15-10mcg/ml  Plan:  Will continue zosyn 3.375gm  IV Q12H EI  Patient adequately loaded with vancomycin 2gm yesterday. Has not had HD since those doses (last HD session noted to be Wednesday).  Vancomycin level drawn after loading doses, not a true trough. Will order vancomycin 750mg  QHD.   Will schedule doses QWMF with trough prior to 3rd HD session. Has not been seen by nephrology yet, will need to follow HD schedule and reschedule doses/trough as needed.  Pharmacy to follow per consult.  Rexene Edison, PharmD Clinical Pharmacist  01/13/2015,7:48 AM

## 2015-01-13 NOTE — Discharge Instructions (Signed)
Wound care: On a daily basis please place Xeroform over suture line, cover with dry gauze, then wrap with Kerlix.

## 2015-01-13 NOTE — Progress Notes (Signed)
Patient alert and oriented x4, patient down to dialysis. Patient NSR on telemetry. Will continue to assess. Wilnette Kales

## 2015-01-13 NOTE — Progress Notes (Signed)
Notified physician per patient request for a laxative, orders received.

## 2015-01-13 NOTE — Care Management Note (Signed)
Patient is active at [facility] on a MWF schedule.  I have sent admission records to clinic and will update with remaining records at discharge. [facility] Liaison [phone]

## 2015-01-13 NOTE — Progress Notes (Signed)
[facility]  Daily Progress Note   Subjective:  POD #1: Right lower extremity second, third, fourth and fifth toe and metatarsal amputation for right forefoot gangrene.  Patient readmitted last night a few hours after surgery for fever and mental status change. Today, patient is afebrile with normal mentation. Complaining of pain at foot amputation site.   Objective: Filed Vitals:   01/13/15 0548 01/13/15 0820 01/13/15 1002 01/13/15 1139  BP: 109/68  136/75 132/76  Pulse: 70  91 80  Temp: 98.2 F (36.8 C)   98 F (36.7 C)  TempSrc: Oral   Oral  Resp: 18   20  Height:      Weight:      SpO2: 100% 94%  100%    Intake/Output Summary (Last 24 hours) at 01/13/15 1406 Last data filed at 01/13/15 1100  Gross per 24 hour  Intake      0 ml  Output      0 ml  Net      0 ml    Physical Exam: A&Ox3, NAD CV: RRR Pulmonary: CTA Bilaterally Abdomen: Soft, Nontender, Nondistended Vascular: Right lower extremity: Thigh soft, calf soft, suture line intact, minimal drainage - no signs of infection noted.   Laboratory: CBC    Component Value Date/Time   WBC 5.8 01/13/2015 0503   WBC 7.2 04/12/2014 1341   HGB 10.3* 01/13/2015 0503   HGB 12.8* 04/12/2014 1341   HCT 31.0* 01/13/2015 0503   HCT 40.9 04/12/2014 1341   PLT 164 01/13/2015 0503   PLT 215 04/12/2014 1341    BMET    Component Value Date/Time   NA 134* 01/13/2015 0503   NA 135* 04/12/2014 1341   K 4.9 01/13/2015 0503   K 5.2 01/12/2015 1210   K 3.6 04/12/2014 1341   CL 97* 01/13/2015 0503   CL 97* 04/12/2014 1341   CO2 29 01/13/2015 0503   CO2 28 04/12/2014 1341   GLUCOSE 109* 01/13/2015 0503   GLUCOSE 85 04/12/2014 1341   BUN 55* 01/13/2015 0503   BUN 40* 04/12/2014 1341   CREATININE 11.46* 01/13/2015 0503   CREATININE 9.58* 04/12/2014 1341   CALCIUM 8.4* 01/13/2015 0503   CALCIUM 9.5 04/12/2014 1341   GFRNONAA 4* 01/13/2015 0503   GFRNONAA 6* 03/15/2014 2323   GFRNONAA 6*  02/10/2014 1119   GFRAA 5* 01/13/2015 0503   GFRAA 7* 03/15/2014 2323   GFRAA 7* 02/10/2014 1119    Assessment/Planning: 59 year old male s/p POD #1: right lower extremity second, third, fourth and fifth toe and metatarsal amputation for right forefoot gangrene admitted last night for fever and altered mental status - improved.  1) Etiology of fever unclear - possibly seeding from amputation yesterday afternoon.  2) Care as per primary team. 3) Follow up information and wound care instructions entered into discharge template. 4) Daily wound care - orders entered into computer.   Marcelle Overlie PA-C 01/13/2015 2:06 PM

## 2015-01-13 NOTE — H&P (Signed)
[place] [facility] [place] NAME: [name]    MR#:  [id number]  DATE OF BIRTH:  1956/05/07  DATE OF ADMISSION:  01/12/2015  PRIMARY CARE PHYSICIAN: Alvester Morin, MD   REQUESTING/REFERRING PHYSICIAN: Dr. Zella Ball  CHIEF COMPLAINT:   Chief Complaint  Patient presents with  . Foot Pain   lethargy with altered sensorium following amputation of right foot toes done earlier today.  HISTORY OF PRESENT ILLNESS:  [name]  is a 59 y.o. male with a known history of end-stage renal disease on hemodialysis-Monday, Wednesday, Friday, hypertension, peripheral arterial disease, coronary artery disease, chronic systolic congestive heart failure, COPD, who has undergone amputation of right foot toes earlier today was sent in from nursing home since he was found to be lethargic with altered sensorium. Patient underwent surgery for right foot gangrene today (amputation of one, 2, 3, 4- toes), was noted to be lethargic at nursing home. On arrival in the ED patient was noted to be febrile with temperature of 101.58F, stable vital signs and lethargic. Workup revealed BUN/creatinine 50/10 0.7, potassium 5.2. After obtaining blood cultures, patient was started on broad-spectrum antibiotics-vancomycin and Zosyn. While in the ED patient's mental status started improving and patient is currently alert awake and oriented 2, complaints of right foot pain, otherwise denies any complaints such as chest pain, shortness of breath, nausea, vomiting, diarrhea, abdominal pain. Hospitalist service was consulted for further management. Patient was given Kayexalate. He underwent last hemodialysis on Wednesday per his sisters who are with the patient at this time.  PAST MEDICAL HISTORY:   Past Medical History  Diagnosis Date  . End stage renal disease   . HTN (hypertension)   . HLD (hyperlipidemia)   . Anemia   . Hyperparathyroidism   . Dialysis patient      Mon, Wed, Fri  . CHF (congestive heart failure)   . Asthma   . COPD (chronic obstructive pulmonary disease)   . Complication of anesthesia     hypotension    PAST SURGICAL HISTORY:   Past Surgical History  Procedure Laterality Date  . Cardiac catheterization      stent placement   . Amputation Left 05/06/2014    Procedure: AMPUTATION BELOW KNEE;  Surgeon: Elam Dutch, MD;  Location: [facility] OR;  Service: Vascular;  Laterality: Left;  . Peripheral vascular catheterization Right 12/15/2014    Procedure: Lower Extremity Angiography;  Surgeon: Algernon Huxley, MD;  Location: [facility];  Service: Cardiovascular;  Laterality: Right;  . Peripheral vascular catheterization  12/15/2014    Procedure: Lower Extremity Intervention;  Surgeon: Algernon Huxley, MD;  Location: [facility];  Service: Cardiovascular;;    SOCIAL HISTORY:   Social History  Substance Use Topics  . Smoking status: Former Research scientist (life sciences)  . Smokeless tobacco: Former Systems developer    Quit date: 11/10/2014     Comment: 5  . Alcohol Use: No    FAMILY HISTORY:   Family History  Problem Relation Age of Onset  . Heart failure Other   . Hypertension Other   . Leukemia Other   . Diabetes Other     DRUG ALLERGIES:  No Known Allergies  REVIEW OF SYSTEMS:   Review of Systems  Constitutional: Positive for fever. Negative for chills and malaise/fatigue.  HENT: Negative for ear pain, hearing loss, nosebleeds, sore throat and tinnitus.   Eyes: Negative for blurred vision, double vision, pain, discharge and redness.  Respiratory: Negative for  cough, hemoptysis, sputum production, shortness of breath and wheezing.   Cardiovascular: Negative for chest pain, palpitations, orthopnea and leg swelling.  Gastrointestinal: Negative for nausea, vomiting, abdominal pain, diarrhea, constipation, blood in stool and melena.  Genitourinary: Negative for dysuria, urgency, frequency and hematuria.  Musculoskeletal:       Right foot pain  at the site of surgery +  Skin: Negative for itching and rash.  Neurological: Negative for dizziness, tingling, sensory change, focal weakness and seizures.       Lethargic with altered sensorium +  Endo/Heme/Allergies: Does not bruise/bleed easily.  Psychiatric/Behavioral: Negative for depression. The patient is not nervous/anxious.     MEDICATIONS AT HOME:   Prior to Admission medications   Medication Sig Start Date End Date Taking? Authorizing Provider  acetaminophen (TYLENOL) 500 MG tablet Take 1,000 mg by mouth every 6 (six) hours as needed for moderate pain.   Yes Historical Provider, MD  albuterol (PROVENTIL HFA;VENTOLIN HFA) 108 (90 BASE) MCG/ACT inhaler Inhale 1 puff into the lungs every 6 (six) hours as needed for wheezing or shortness of breath.   Yes Historical Provider, MD  aspirin 325 MG tablet Take 325 mg by mouth daily.    Yes Historical Provider, MD  budesonide-formoterol (SYMBICORT) 160-4.5 MCG/ACT inhaler Inhale 2 puffs into the lungs 2 (two) times daily.   Yes Historical Provider, MD  calcium acetate (PHOSLO) 667 MG capsule Take 1 capsule (667 mg total) by mouth 3 (three) times daily with meals. 05/10/14  Yes Shanker Kristeen Mans, MD  Darbepoetin Alfa (ARANESP) 40 MCG/0.4ML SOSY injection Inject 0.4 mLs (40 mcg total) into the vein every Monday with hemodialysis. 05/16/14  Yes Shanker Kristeen Mans, MD  docusate sodium (COLACE) 100 MG capsule Take 100 mg by mouth 2 (two) times daily.   Yes Historical Provider, MD  doxercalciferol (HECTOROL) 4 MCG/2ML injection Inject 1.25 mLs (2.5 mcg total) into the vein every Monday, Wednesday, and Friday with hemodialysis. 05/10/14  Yes Shanker Kristeen Mans, MD  ferric gluconate 125 mg in sodium chloride 0.9 % 100 mL Inject 125 mg into the vein every Monday, Wednesday, and Friday with hemodialysis. 05/10/14  Yes Shanker Kristeen Mans, MD  gabapentin (NEURONTIN) 300 MG capsule Take 1 capsule by mouth at bedtime. 12/23/14  Yes Historical Provider, MD   ibuprofen (ADVIL,MOTRIN) 600 MG tablet Take 600 mg by mouth every 6 (six) hours as needed.   Yes Historical Provider, MD  metoprolol tartrate (LOPRESSOR) 25 MG tablet Take 25 mg by mouth 2 (two) times daily.     Yes Historical Provider, MD  midodrine (PROAMATINE) 5 MG tablet Take 5 mg by mouth. 3 times a week, 30 minutes before dialysis   Yes Historical Provider, MD  multivitamin (RENA-VIT) TABS tablet Take 1 tablet by mouth at bedtime. 05/10/14  Yes Shanker Kristeen Mans, MD  nicotine (NICODERM CQ - DOSED IN MG/24 HOURS) 21 mg/24hr patch Place 1 patch (21 mg total) onto the skin daily. 05/10/14  Yes Shanker Kristeen Mans, MD  Nutritional Supplements (FEEDING SUPPLEMENT, NEPRO CARB STEADY,) LIQD Take 237 mLs by mouth daily.   Yes Historical Provider, MD  ondansetron (ZOFRAN) 4 MG tablet Take 1 tablet (4 mg total) by mouth every 6 (six) hours as needed for nausea. 05/10/14  Yes Shanker Kristeen Mans, MD  oxyCODONE (OXY IR/ROXICODONE) 5 MG immediate release tablet Take 1-2 tablets (5-10 mg total) by mouth every 4 (four) hours as needed for moderate pain. 05/10/14  Yes Shanker Kristeen Mans, MD  pantoprazole (PROTONIX) 20 MG  tablet Take 1 tablet by mouth daily. 12/23/14  Yes Historical Provider, MD  SANTYL ointment Apply 1 application topically as needed. 10/13/14  Yes Historical Provider, MD  sevelamer carbonate (RENVELA) 800 MG tablet Take 800 mg by mouth 3 (three) times daily with meals.   Yes Historical Provider, MD  simvastatin (ZOCOR) 10 MG tablet Take 1 tablet by mouth every evening. 12/23/14  Yes Historical Provider, MD  tiotropium (SPIRIVA) 18 MCG inhalation capsule Place 18 mcg into inhaler and inhale daily.   Yes Historical Provider, MD  triamcinolone cream (KENALOG) 0.1 % Apply 1 application topically daily as needed. 11/07/14  Yes Historical Provider, MD      VITAL SIGNS:  Blood pressure 107/61, pulse 93, temperature 101.4 F (38.6 C), temperature source Oral, resp. rate 12, height 6\' 2"  (1.88 m), weight  80.74 kg (178 lb), SpO2 94 %.  PHYSICAL EXAMINATION:  Physical Exam  Constitutional: He is oriented to person, place, and time. He appears well-developed and well-nourished.  Moderate distress + because of foot pain  HENT:  Head: Normocephalic and atraumatic.  Right Ear: External ear normal.  Left Ear: External ear normal.  Nose: Nose normal.  Mouth/Throat: Oropharynx is clear and moist. No oropharyngeal exudate.  Eyes: EOM are normal. Pupils are equal, round, and reactive to light. No scleral icterus.  Neck: Normal range of motion. Neck supple. No JVD present. No thyromegaly present.  Cardiovascular: Normal rate, regular rhythm, normal heart sounds and intact distal pulses.  Exam reveals no friction rub.   No murmur heard. Respiratory: Effort normal and breath sounds normal. No respiratory distress. He has no wheezes. He has no rales. He exhibits no tenderness.  GI: Soft. Bowel sounds are normal. He exhibits no distension and no mass. There is no tenderness. There is no rebound and no guarding.  Musculoskeletal:  Right foot surgical dressing in place. Left lower extremity BKA  Lymphadenopathy:    He has no cervical adenopathy.  Neurological: He is alert and oriented to person, place, and time. He has normal reflexes. He displays normal reflexes. No cranial nerve deficit. He exhibits normal muscle tone.  Skin: Skin is warm. No rash noted. No erythema.  Psychiatric: He has a normal mood and affect. His behavior is normal.   LABORATORY PANEL:   CBC  Recent Labs Lab 01/12/15 2250  WBC 7.1  HGB 11.3*  HCT 34.4*  PLT 184   ------------------------------------------------------------------------------------------------------------------  Chemistries   Recent Labs Lab 01/12/15 2250  NA 132*  K 5.2*  CL 96*  CO2 23  GLUCOSE 82  BUN 50*  CREATININE 10.70*  CALCIUM 8.5*  AST 89*  ALT 66*  ALKPHOS 256*  BILITOT 1.0    ------------------------------------------------------------------------------------------------------------------  Cardiac Enzymes  Recent Labs Lab 01/12/15 2250  TROPONINI 0.07*   ------------------------------------------------------------------------------------------------------------------  RADIOLOGY:  Dg Chest Port 1 View  01/13/2015   CLINICAL DATA:  Initial valuation acute fever, weakness.  EXAM: PORTABLE CHEST - 1 VIEW  COMPARISON:  Prior study from 01/03/2015  FINDINGS: Cardiomegaly is stable from prior. Mediastinal silhouette within normal limits.  The lungs are hypoinflated. Diffuse pulmonary vascular congestion with indistinctness of the interstitial markings most suggestive of pulmonary edema. Probable small bilateral pleural effusions present. Superimposed infectious infiltrate at the right lung base not entirely excluded. No other focal airspace disease.  No acute osseous abnormality.  IMPRESSION: 1. Cardiomegaly with findings most suggestive of mild to moderate diffuse pulmonary edema. 2. Bilateral pleural effusions, right greater than left. Patchy opacity at the right  lung base favored to reflect edema and/or effusion, although superimposed infiltrate not entirely excluded   Electronically Signed   By: Jeannine Boga M.D.   On: 01/13/2015 00:19    EKG:   Orders placed or performed during the hospital encounter of 01/03/15  . EKG 12-Lead  . EKG 12-Lead  Normal sinus rhythm with ventricular rate of 70 bpm, LAD, L BBB.  IMPRESSION AND PLAN:  59 year old male, nursing home resident, status post amputation of right footfor gangrene done earlier today was brought in with the complaints of right foot pain and lethargy with altered sensorium. 1. Fever secondary to sepsis-cellulitis/gangrene right foot, status post amputation of right foot toes done earlier today. 2. Lethargy/altered sensorium-acute encephalopathy, likely secondary to above. Plan: Admit to [facility],  continue IV antibiotics-vancomycin and Zosyn, follow-up blood cultures, CBC. Vascular surgery consultation requested for postoperative follow-up and further evaluation. 3. End-stage renal disease on hemodialysis-Monday, Wednesday, Friday. Last hemodialysis on Wednesday. Creatinine 10.7, potassium 5.2. Plan: Nephrology consultation requested for hemodialysis 4. Hyperkalemia. Plan: Kayexalate, nephrology consultation for hemodialysis, follow-up BMP. 5. Hypertension, stable on home medications. Continue same. 6 coronary artery disease status post stent, stable, no symptoms. EKG no acute ischemic changes. Troponin elevated at 0.07, chronic, likely related to end-stage renal disease.  Plan: Monitor, cycle cardiac enzymes, follow up accordingly. 7. Chronic systolic congestive heart failure, stable clinically. 8. COPD, stable on home medications. Continue same. 9. Hyperlipidemia, stable on statin. Continue same.    All the records are reviewed and case discussed with ED provider. Management plans discussed with the patient, family and they are in agreement.  CODE STATUS: Full code  TOTAL TIME TAKING CARE OF THIS PATIENT: 50 minutes.    Juluis Mire M.D on 01/13/2015 at 1:03 AM  Between 7am to 6pm - Pager - [phone]  After 6pm go to [email or web address] - password [email or web address] [facility]  Office  [phone]  CC: Primary care physician; Alvester Morin, MD

## 2015-01-13 NOTE — Progress Notes (Signed)
[place] [facility] [place] NAME: [name]    MR#:  [id number]  DATE OF BIRTH:  02/07/1956  SUBJECTIVE:  Quiet belligerent about different issues he has been dealing with. Appears well oriented. Family in the room.  Brought is after he was found to have lethergy and fever 101 at [facility]. No more fever today  REVIEW OF SYSTEMS:   Review of Systems  Constitutional: Negative for fever, chills and diaphoresis.  HENT: Negative for congestion, ear pain, hearing loss, nosebleeds and sore throat.   Eyes: Negative for blurred vision, double vision, photophobia and pain.  Respiratory: Negative for hemoptysis, sputum production, wheezing and stridor.   Cardiovascular: Negative for orthopnea, claudication and leg swelling.  Gastrointestinal: Negative for heartburn and abdominal pain.  Genitourinary: Negative for dysuria and frequency.  Musculoskeletal: Positive for joint pain. Negative for back pain and neck pain.  Skin: Negative for rash.  Neurological: Negative for tingling, sensory change, speech change, focal weakness, seizures and headaches.  Endo/Heme/Allergies: Does not bruise/bleed easily.  Psychiatric/Behavioral: Negative for memory loss. The patient is not nervous/anxious.   All other systems reviewed and are negative.  Tolerating Diet:yes  DRUG ALLERGIES:  No Known Allergies  VITALS:  Blood pressure 132/76, pulse 80, temperature 98 F (36.7 C), temperature source Oral, resp. rate 20, height 6\' 2"  (1.88 m), weight 78.109 kg (172 lb 3.2 oz), SpO2 100 %.  PHYSICAL EXAMINATION:   Physical Exam  GENERAL:  59 y.o.-year-old patient lying in the bed with no acute distress.  EYES: Pupils equal, round, reactive to light and accommodation. No scleral icterus. Extraocular muscles intact.  HEENT: Head atraumatic, normocephalic. Oropharynx and nasopharynx clear.  NECK:  Supple, no jugular venous distention. No thyroid enlargement, no tenderness.   LUNGS: Normal breath sounds bilaterally, no wheezing, rales, rhonchi. No use of accessory muscles of respiration.  CARDIOVASCULAR: S1, S2 normal. No murmurs, rubs, or gallops.  ABDOMEN: Soft, nontender, nondistended. Bowel sounds present. No organomegaly or mass.  EXTREMITIES: No cyanosis, clubbing or edema b/l.    rigth foot surgical dressing+, left foot BKA stump ok NEUROLOGIC: Cranial nerves II through XII are intact. No focal Motor or sensory deficits b/l.   PSYCHIATRIC: The patient is alert and oriented x 3. agitated SKIN: No obvious rash, lesion, or ulcer.    LABORATORY PANEL:   CBC  Recent Labs Lab 01/13/15 0503  WBC 5.8  HGB 10.3*  HCT 31.0*  PLT 164    Chemistries   Recent Labs Lab 01/13/15 0503  NA 134*  K 4.9  CL 97*  CO2 29  GLUCOSE 109*  BUN 55*  CREATININE 11.46*  CALCIUM 8.4*  AST 86*  ALT 62  ALKPHOS 216*  BILITOT 0.9    Cardiac Enzymes  Recent Labs Lab 01/13/15 0503  TROPONINI 0.07*    RADIOLOGY:  Dg Chest Port 1 View  01/13/2015   CLINICAL DATA:  Initial valuation acute fever, weakness.  EXAM: PORTABLE CHEST - 1 VIEW  COMPARISON:  Prior study from 01/03/2015  FINDINGS: Cardiomegaly is stable from prior. Mediastinal silhouette within normal limits.  The lungs are hypoinflated. Diffuse pulmonary vascular congestion with indistinctness of the interstitial markings most suggestive of pulmonary edema. Probable small bilateral pleural effusions present. Superimposed infectious infiltrate at the right lung base not entirely excluded. No other focal airspace disease.  No acute osseous abnormality.  IMPRESSION: 1. Cardiomegaly with findings most suggestive of mild to moderate diffuse pulmonary edema. 2. Bilateral pleural effusions, right  greater than left. Patchy opacity at the right lung base favored to reflect edema and/or effusion, although superimposed infiltrate not entirely excluded   Electronically Signed   By: Jeannine Boga M.D.   On:  01/13/2015 00:19     ASSESSMENT AND PLAN:   59 year old male, nursing home resident, status post amputation of right footfor gangrene done earlier today was brought in with the complaints of right foot pain and lethargy with altered sensorium. 1. Fever secondary to sepsis-cellulitis/gangrene right foot, status post amputation of right foot toes done earlier today. -appears more so due to post surgical inflammatory changes. No more spikes in fever -BC pedning -Empirically on Van +zosyn 2. Lethargy/altered sensorium-acute encephalopathy, likely secondary to above. -mentation back to normalVascular surgery consultation requested for postoperative follow-up and further evaluation. 3. End-stage renal disease on hemodialysis-Monday, Wednesday, Friday. Last hemodialysis on Wednesday. Creatinine 10.7, potassium 5.2.  Nephrology consultation requested for hemodialysis 4. Hyperkalemia. Plan: Kayexalate, nephrology consultation for hemodialysis, follow-up BMP. 5. Hypertension, stable on home medications. Continue same. 6 coronary artery disease status post stent, stable, no symptoms. EKG no acute ischemic changes. Troponin elevated at 0.07, chronic, likely related to end-stage renal disease.  7. Chronic systolic congestive heart failure, stable clinically. 8. COPD, stable on home medications. Continue same. 9. Hyperlipidemia, stable on statin. Continue same.  Case discussed with Care Management/Social Worker. Management plans discussed with the patient, family and they are in agreement.   CODE STATUS: full  DVT Prophylaxis: heparin  TOTAL TIME TAKING CARE OF THIS PATIENT 30 minutes.  >50% time spent on counselling and coordination of care pt,csw,family and dr dew POSSIBLE D/C IN am DEPENDING ON CLINICAL CONDITION.   Simara Rhyner M.D on 01/13/2015 at 12:01 PM  Between 7am to 6pm - Pager - [phone]  After 6pm go to [email or web address] - password [email or web address] [facility]  Office   [phone]  CC: Primary care physician; Alvester Morin, MD

## 2015-01-13 NOTE — Op Note (Signed)
Please note, that the operative note dated 01/12/2015 has a typographical error in the Procedure.  The amputation was of the second, third, fourth, and fifth toes.  The first toe was not amputated.  Thanks [employer]

## 2015-01-13 NOTE — Progress Notes (Signed)
[facility]  ROUNDING NOTE   Subjective:  Pt had amputation of 1-4th right toes and metatarsal amputation yesterday. Came back with altered mental status and lethargy. He follows at [facility] [address]. He is due for HD today.   Objective:  Vital signs in last 24 hours:  Temp:  [97.1 F (36.2 C)-101.4 F (38.6 C)] 98.2 F (36.8 C) (08/19 0548) Pulse Rate:  [55-102] 91 (08/19 1002) Resp:  [8-25] 18 (08/19 0548) BP: (104-139)/(61-88) 136/75 mmHg (08/19 1002) SpO2:  [94 %-100 %] 94 % (08/19 0820) Weight:  [78.109 kg (172 lb 3.2 oz)-80.74 kg (178 lb)] 78.109 kg (172 lb 3.2 oz) (08/19 0221)  Weight change:  Filed Weights   01/12/15 2240 01/13/15 0221  Weight: 80.74 kg (178 lb) 78.109 kg (172 lb 3.2 oz)    Intake/Output:     Intake/Output this shift:     Physical Exam: General: NAD, resting comfortably  Head: Normocephalic, atraumatic. Moist oral mucosal membranes  Eyes: Anicteric  Neck: Supple, trachea midline  Lungs:  Clear to auscultation  Heart: Regular rate and rhythm  Abdomen:  Soft, nontender, BS present  Extremities:  trace peripheral edema, right foot wrapped, L BKA  Neurologic: Nonfocal, moves both UE's  Skin: No lesions  Access: RUE AVF    Basic Metabolic Panel:  Recent Labs Lab 01/09/15 1000 01/11/15 1030 01/12/15 1210 01/12/15 2250 01/13/15 0153 01/13/15 0503  NA  --   --   --  132*  --  134*  K 3.8 5.0 5.2 5.2*  --  4.9  CL  --   --   --  96*  --  97*  CO2  --   --   --  23  --  29  GLUCOSE  --   --   --  82  --  109*  BUN  --   --   --  50*  --  55*  CREATININE  --   --   --  10.70* 11.00* 11.46*  CALCIUM  --   --   --  8.5*  --  8.4*    Liver Function Tests:  Recent Labs Lab 01/12/15 2250 01/13/15 0503  AST 89* 86*  ALT 66* 62  ALKPHOS 256* 216*  BILITOT 1.0 0.9  PROT 8.1 7.1  ALBUMIN 3.1* 2.8*   No results for input(s): LIPASE, AMYLASE in the last 168 hours. No results for input(s): AMMONIA in the last 168  hours.  CBC:  Recent Labs Lab 01/12/15 2250 01/13/15 0153 01/13/15 0503  WBC 7.1 6.8 5.8  NEUTROABS 5.5  --   --   HGB 11.3* 10.4* 10.3*  HCT 34.4* 31.7* 31.0*  MCV 94.6 94.3 95.0  PLT 184 167 164    Cardiac Enzymes:  Recent Labs Lab 01/12/15 2250 01/13/15 0153 01/13/15 0503  TROPONINI 0.07* 0.07* 0.07*    BNP: Invalid input(s): POCBNP  CBG:  Recent Labs Lab 01/12/15 1159 01/12/15 1305 01/12/15 1449 01/12/15 1534 01/12/15 1630  GLUCAP 64* 82 58* 74 61    Microbiology: Results for orders placed or performed during the hospital encounter of 01/12/15  Blood Culture (routine x 2)     Status: None (Preliminary result)   Collection Time: 01/12/15 11:18 PM  Result Value Ref Range Status   Specimen Description BLOOD  Final   Special Requests BOTTLES DRAWN AEROBIC AND ANAEROBIC 9ML  Final   Culture NO GROWTH < 12 HOURS  Final   Report Status PENDING  Incomplete  Blood Culture (routine x 2)     Status: None (Preliminary result)   Collection Time: 01/12/15 11:28 PM  Result Value Ref Range Status   Specimen Description BLOOD  Final   Special Requests BOTTLES DRAWN AEROBIC AND ANAEROBIC 10ML  Final   Culture NO GROWTH < 12 HOURS  Final   Report Status PENDING  Incomplete    Coagulation Studies: No results for input(s): LABPROT, INR in the last 72 hours.  Urinalysis: No results for input(s): COLORURINE, LABSPEC, PHURINE, GLUCOSEU, HGBUR, BILIRUBINUR, KETONESUR, PROTEINUR, UROBILINOGEN, NITRITE, LEUKOCYTESUR in the last 72 hours.  Invalid input(s): APPERANCEUR    Imaging: Dg Chest Port 1 View  01/13/2015   CLINICAL DATA:  Initial valuation acute fever, weakness.  EXAM: PORTABLE CHEST - 1 VIEW  COMPARISON:  Prior study from 01/03/2015  FINDINGS: Cardiomegaly is stable from prior. Mediastinal silhouette within normal limits.  The lungs are hypoinflated. Diffuse pulmonary vascular congestion with indistinctness of the interstitial markings most suggestive of  pulmonary edema. Probable small bilateral pleural effusions present. Superimposed infectious infiltrate at the right lung base not entirely excluded. No other focal airspace disease.  No acute osseous abnormality.  IMPRESSION: 1. Cardiomegaly with findings most suggestive of mild to moderate diffuse pulmonary edema. 2. Bilateral pleural effusions, right greater than left. Patchy opacity at the right lung base favored to reflect edema and/or effusion, although superimposed infiltrate not entirely excluded   Electronically Signed   By: Jeannine Boga M.D.   On: 01/13/2015 00:19     Medications:     . aspirin  325 mg Oral Daily  . budesonide-formoterol  2 puff Inhalation BID  . calcium acetate  667 mg Oral TID WC  . docusate sodium  100 mg Oral BID  . doxercalciferol  2.5 mcg Intravenous Q M,W,F-HD  . feeding supplement (NEPRO CARB STEADY)  237 mL Oral Daily  . ferric gluconate (FERRLECIT/NULECIT) IV  125 mg Intravenous Q M,W,F-HD  . gabapentin  300 mg Oral QHS  . heparin  5,000 Units Subcutaneous 3 times per day  . ipratropium-albuterol  3 mL Nebulization Q6H  . metoprolol tartrate  25 mg Oral BID  . multivitamin  1 tablet Oral QHS  . nicotine  21 mg Transdermal Daily  . pantoprazole  40 mg Oral QAC breakfast  . piperacillin-tazobactam      . piperacillin-tazobactam (ZOSYN)  IV  3.375 g Intravenous Q12H  . sevelamer carbonate  800 mg Oral TID WC  . simvastatin  10 mg Oral QPM  . tiotropium  18 mcg Inhalation Daily  . vancomycin  750 mg Intravenous Q M,W,F-HD   sodium chloride, sodium chloride, acetaminophen, alteplase, feeding supplement (NEPRO CARB STEADY), heparin, lidocaine (PF), lidocaine-prilocaine, morphine injection, ondansetron **OR** ondansetron (ZOFRAN) IV, ondansetron, oxyCODONE, pentafluoroprop-tetrafluoroeth, sodium chloride  Assessment/ Plan:  59 y.o. male with past medical history of end-stage renal disease on hemodialysis Monday, Wednesday, Friday, hypertension,  hyperlipidemia, anemia of chronic kidney disease, secondary hyperparathyroidism, congestive heart failure, COPD, peripheral vascular disease, left below the knee amputation who underwent amputation of first through fourth right toes on 01/12/2015. Patient came back with lethargy and altered mental status.  1. End-stage renal disease on hemodialysis Monday, Wednesday, Friday. The patient is due for dialysis today. We have prepared orders for today. Patient will undergo ultrafiltration of 2 kg.  2. Anemia of chronic kidney disease. Hemoglobin currently except we'll. We will hold off on adding Neupogen at this time.  3. Secondary hyperparathyroidism. Continue the patient on Renvela 800 mg  by mouth 3 times a day as well as phoslo 667mg  PO TID/WM. Check serum phosphorus today.  4. Hypertension. Blood pressure currently acceptable. Continue metoprolol 25 mg by mouth twice a day.  4.   LOS: 0 [name] 8/19/201610:10 AM

## 2015-01-13 NOTE — Progress Notes (Signed)
Patient refusing assistance to [facility] from nurse and nurse tech. Patient educated on safety. [name]

## 2015-01-13 NOTE — Clinical Social Work Note (Signed)
Clinical Social Work Assessment  Patient Details  Name: [name] MRN: [id number] Date of Birth: 10/15/1955  Date of referral:  01/13/15               Reason for consult:  Facility Placement                Permission sought to share information with:  Facility Sport and exercise psychologist, Family Supports Permission granted to share information::  Yes, Verbal Permission Granted  Name::        Agency::     Relationship::     Contact Information:     Housing/Transportation Living arrangements for the past 2 months:  [place] of Information:  Patient Patient Interpreter Needed:  None Criminal Activity/Legal Involvement Pertinent to Current Situation/Hospitalization:  No - Comment as needed Significant Relationships:  Siblings Lives with:    Do you feel safe going back to the place where you live?  Yes Need for family participation in patient care:  No (Coment)  Care giving concerns:  Patient is at [facility] for rehab.   Social Worker assessment / plan:  Patient admitted yesterday afternoon. CSW met with patient and several of his sisters who were visiting with him this morning. Patient had been belligerent with staff this morning and cursing. Physician had informed CSW that patient would return to [facility] tomorrow. CSW introduced self to patient and sisters and informed him that I would facilitate getting him back to [facility] tomorrow. Patient initially asked if I would find him another facility but then told CSW "nevermind." Patient is also established with [facility] for dialysis on [address] on MWF. FL2 completed and on chart.  Employment status:  Disabled (Comment on whether or not currently receiving Disability) Insurance information:    PT Recommendations:  Not assessed at this time Information / Referral to community resources:     Patient/Family's Response to care: Sisters were pleasant, patient behaved frustrated and mumbling under  breath.  Patient/Family's Understanding of and Emotional Response to Diagnosis, Current Treatment, and Prognosis:  Patient verbalized wants to CSW.  Emotional Assessment Appearance:    Attitude/Demeanor/Rapport:  Self-Absorbed, Hostile Affect (typically observed):  Blunt, Irritable Orientation:  Oriented to Self, Oriented to Place, Oriented to  Time, Oriented to Situation Alcohol / Substance use:  Not Applicable Psych involvement (Current and /or in the community):  No (Comment)  Discharge Needs  Concerns to be addressed:  Care Coordination Readmission within the last 30 days:  No Current discharge risk:  None Barriers to Discharge:  No Barriers Identified   Shela Leff, LCSW 01/13/2015, 11:44 AM

## 2015-01-13 NOTE — Progress Notes (Signed)
Initial Nutrition Assessment    INTERVENTION:   Medical Food Supplement Therapy: recommend increasing Nepro to BID at present due to increased needs during acute illness Meals and Snacks: Cater to patient preferences  NUTRITION DIAGNOSIS:   Increased nutrient needs related to chronic illness, wound healing as evidenced by estimated needs.   GOAL:   Patient will meet greater than or equal to 90% of their needs  MONITOR:    (Energy Intake, Digestive system, Anthropometrics, Electrolyte/Renal Profile)  REASON FOR ASSESSMENT:    (Dialysis Pt, renal diet)    ASSESSMENT:    Pt admitted with fever with sepsis due to gangrenous foot s/p amputation of toes on right foot, AMS, noted pt belligerent today with regards to numerous issues  Past Medical History  Diagnosis Date  . End stage renal disease   . HTN (hypertension)   . HLD (hyperlipidemia)   . Anemia   . Hyperparathyroidism   . Dialysis patient     Mon, Wed, Fri  . CHF (congestive heart failure)   . Asthma   . COPD (chronic obstructive pulmonary disease)   . Complication of anesthesia     hypotension     Diet Order:  Diet renal with fluid restriction Fluid restriction:: 1200 mL Fluid; Room service appropriate?: Yes; Fluid consistency:: Thin  Energy Intake:  No recorded po intake, noted pt drinks Nepro daily at thome  Electrolyte and Renal Profile:  Recent Labs Lab 01/12/15 1210 01/12/15 2250 01/13/15 0153 01/13/15 0503 01/13/15 1348  BUN  --  50*  --  55*  --   CREATININE  --  10.70* 11.00* 11.46*  --   NA  --  132*  --  134*  --   K 5.2 5.2*  --  4.9  --   PHOS  --   --   --   --  5.0*   Glucose Profile:   Recent Labs  01/12/15 1449 01/12/15 1534 01/12/15 1630  GLUCAP 58* 74 66   Protein Profile:   Recent Labs Lab 01/12/15 2250 01/13/15 0503  ALBUMIN 3.1* 2.8*   Meds: colace, MVI, renvela  Nutrition Focused Physical Exam:  Unable to complete Nutrition-Focused physical exam at this  time.   Height:   Ht Readings from Last 1 Encounters:  01/12/15 6\' 2"  (1.88 m)    Weight:   Wt Readings from Last 1 Encounters:  01/13/15 172 lb 3.2 oz (78.109 kg)   Wt Readings from Last 10 Encounters:  01/13/15 172 lb 3.2 oz (78.109 kg)  01/05/15 183 lb (83.008 kg)  01/03/15 183 lb (83.008 kg)  06/09/14 164 lb (74.39 kg)  05/09/14 171 lb 11.8 oz (77.9 kg)  04/05/14 194 lb (87.998 kg)  04/26/10 234 lb 8 oz (106.369 kg)   BMI:  Body mass index is 22.1 kg/(m^2).  Estimated Nutritional Needs:   Kcal:  [id number] kcals (BEE 1654, 1.3 AF, 1.1-1.3 IF)   Protein:  94-117 g (1.2-1.5 g/kg)   Fluid:  1000 mL plus [place] MS, RD, LDN [phone] Pager

## 2015-01-13 NOTE — ED Provider Notes (Signed)
[facility] Emergency Department Provider Note  ____________________________________________  Time seen: Approximately 1105 PM  I have reviewed the triage vital signs and the nursing notes.   HISTORY  Chief Complaint Foot Pain    HPI [name] is a 59 y.o. male with a history of end-stage renal disease on dialysis, CHF and a recent right foot indication of toes 2 through 5 earlier today present in with altered mental status. His family states that he has been altered for about 7 or 8 hours however since he completed his surgery. He usually knows his name, where he is and does not have chronic pain issues. However, he was sent in from his skilled nursing facility for intractable pain as well as altered mental status. His family says that he did not recognize them and was unable to call them by their names. At this time the patient is complaining of right foot pain. Denies any headache or neck pain. Denies any chest pain or shortness of breath. Family says that the patient does not make urine.   Past Medical History  Diagnosis Date  . End stage renal disease   . HTN (hypertension)   . HLD (hyperlipidemia)   . Anemia   . Hyperparathyroidism   . Diabetes mellitus without complication   . Dialysis patient     Mon, Wed, Fri  . CHF (congestive heart failure)   . Asthma   . COPD (chronic obstructive pulmonary disease)   . Complication of anesthesia     hypotension    Patient Active Problem List   Diagnosis Date Noted  . Gangrene of foot 05/04/2014  . ESRD (end stage renal disease) on dialysis 05/04/2014  . CAD (coronary artery disease) 05/04/2014  . Normocytic anemia 05/04/2014  . HYPERLIPIDEMIA-MIXED 04/26/2010  . HYPERTENSION, BENIGN 04/26/2010  . CAD, NATIVE VESSEL 04/26/2010  . END STAGE RENAL DISEASE 04/26/2010    Past Surgical History  Procedure Laterality Date  . Cardiac catheterization      stent placement   . Amputation Left 05/06/2014     Procedure: AMPUTATION BELOW KNEE;  Surgeon: Elam Dutch, MD;  Location: [facility] OR;  Service: Vascular;  Laterality: Left;  . Peripheral vascular catheterization Right 12/15/2014    Procedure: Lower Extremity Angiography;  Surgeon: Algernon Huxley, MD;  Location: [place] [facility];  Service: Cardiovascular;  Laterality: Right;  . Peripheral vascular catheterization  12/15/2014    Procedure: Lower Extremity Intervention;  Surgeon: Algernon Huxley, MD;  Location: [place] [facility];  Service: Cardiovascular;;    Current Outpatient Rx  Name  Route  Sig  Dispense  Refill  . acetaminophen (TYLENOL) 500 MG tablet   Oral   Take 1,000 mg by mouth every 6 (six) hours as needed for moderate pain.         [name] albuterol (PROVENTIL HFA;VENTOLIN HFA) 108 (90 BASE) MCG/ACT inhaler   Inhalation   Inhale 1 puff into the lungs every 6 (six) hours as needed for wheezing or shortness of breath.         [name] aspirin 325 MG tablet   Oral   Take 325 mg by mouth daily.          . budesonide-formoterol (SYMBICORT) 160-4.5 MCG/ACT inhaler   Inhalation   Inhale 2 puffs into the lungs 2 (two) times daily.         . calcium acetate (PHOSLO) 667 MG capsule   Oral   Take 1 capsule (667 mg total)  by mouth 3 (three) times daily with meals.         . Darbepoetin Alfa (ARANESP) 40 MCG/0.4ML SOSY injection   Intravenous   Inject 0.4 mLs (40 mcg total) into the vein every Monday with hemodialysis.   8.4 mL      . docusate sodium (COLACE) 100 MG capsule   Oral   Take 100 mg by mouth 2 (two) times daily.         [name] doxercalciferol (HECTOROL) 4 MCG/2ML injection   Intravenous   Inject 1.25 mLs (2.5 mcg total) into the vein every Monday, Wednesday, and Friday with hemodialysis.   2 mL      . ferric gluconate 125 mg in sodium chloride 0.9 % 100 mL   Intravenous   Inject 125 mg into the vein every Monday, Wednesday, and Friday with hemodialysis.         [name] gabapentin (NEURONTIN) 300 MG capsule   Oral    Take 1 capsule by mouth at bedtime.         [name] ibuprofen (ADVIL,MOTRIN) 600 MG tablet   Oral   Take 600 mg by mouth every 6 (six) hours as needed.         . metoprolol tartrate (LOPRESSOR) 25 MG tablet   Oral   Take 25 mg by mouth 2 (two) times daily.           . midodrine (PROAMATINE) 5 MG tablet   Oral   Take 5 mg by mouth. 3 times a week, 30 minutes before dialysis         . multivitamin (RENA-VIT) TABS tablet   Oral   Take 1 tablet by mouth at bedtime.      0   . nicotine (NICODERM CQ - DOSED IN MG/24 HOURS) 21 mg/24hr patch   Transdermal   Place 1 patch (21 mg total) onto the skin daily.   28 patch   0   . Nutritional Supplements (FEEDING SUPPLEMENT, NEPRO CARB STEADY,) LIQD   Oral   Take 237 mLs by mouth daily.         . ondansetron (ZOFRAN) 4 MG tablet   Oral   Take 1 tablet (4 mg total) by mouth every 6 (six) hours as needed for nausea.   20 tablet   0   . oxyCODONE (OXY IR/ROXICODONE) 5 MG immediate release tablet   Oral   Take 1-2 tablets (5-10 mg total) by mouth every 4 (four) hours as needed for moderate pain.   30 tablet   0   . pantoprazole (PROTONIX) 20 MG tablet   Oral   Take 1 tablet by mouth daily.         [name] SANTYL ointment   Topical   Apply 1 application topically as needed.           Dispense as written.   . sevelamer carbonate (RENVELA) 800 MG tablet   Oral   Take 800 mg by mouth 3 (three) times daily with meals.         . simvastatin (ZOCOR) 10 MG tablet   Oral   Take 1 tablet by mouth every evening.         . tiotropium (SPIRIVA) 18 MCG inhalation capsule   Inhalation   Place 18 mcg into inhaler and inhale daily.         [name] triamcinolone cream (KENALOG) 0.1 %   Topical   Apply 1 application topically daily as needed.  Allergies Review of patient's allergies indicates no known allergies.  Family History  Problem Relation Age of Onset  . Heart failure Other   . Hypertension Other   .  Leukemia Other   . Diabetes Other     Social History Social History  Substance Use Topics  . Smoking status: Former Research scientist (life sciences)  . Smokeless tobacco: Former Systems developer    Quit date: 11/10/2014     Comment: 5  . Alcohol Use: No    Review of Systems Constitutional: No fever/chills Eyes: No visual changes. ENT: No sore throat. Cardiovascular: Denies chest pain. Respiratory: Denies shortness of breath. Gastrointestinal: No abdominal pain.  No nausea, no vomiting.  No diarrhea.  No constipation. Genitourinary: Negative for dysuria. Musculoskeletal: Negative for back pain. Skin: Negative for rash. Neurological: Negative for headaches, focal weakness or numbness.  10-point ROS otherwise negative.  ____________________________________________   PHYSICAL EXAM:  VITAL SIGNS: ED Triage Vitals  Enc Vitals Group     BP 01/12/15 2240 138/86 mmHg     Pulse Rate 01/12/15 2240 101     Resp 01/12/15 2240 25     Temp 01/12/15 2240 101.3 F (38.5 C)     Temp Source 01/12/15 2240 Oral     SpO2 01/12/15 2240 96 %     Weight 01/12/15 2240 178 lb (80.74 kg)     Height 01/12/15 2240 6\' 2"  (1.88 m)     Head Cir --      Peak Flow --      Pain Score 01/12/15 2241 10     Pain Loc --      Pain Edu? --      Excl. in [place]? --     Constitutional: Alert and rocking back-and-forth on the bed. Moaning.  Eyes: Conjunctivae are normal. PERRL. EOMI. Head: Atraumatic. Nose: No congestion/rhinnorhea. Mouth/Throat: Mucous membranes are moist.  Oropharynx non-erythematous. Neck: No stridor.  Ranges freely without any meningismus. Cardiovascular: Tachycardic, regular rhythm. Grossly normal heart sounds.  Good peripheral circulation. Respiratory: Normal respiratory effort.  No retractions. Lungs CTAB. Gastrointestinal: Soft and nontender. No distention. No abdominal bruits. No CVA tenderness. Musculoskeletal: No lower extremity tenderness nor edema.  No joint effusions. Left sided BKA. Right side bandage with  mild amount of blood soaked through.  Neurologic:  Normal speech but not oriented to self and location. Moving all 4 extremities equally. No facial droop. Skin:  Skin is warm, dry and intact. No rash noted. Psychiatric: Mood and affect are normal. Speech and behavior are normal.  ____________________________________________   LABS (all labs ordered are listed, but only abnormal results are displayed)  Labs Reviewed  COMPREHENSIVE METABOLIC PANEL - Abnormal; Notable for the following:    Sodium 132 (*)    Potassium 5.2 (*)    Chloride 96 (*)    BUN 50 (*)    Creatinine, Ser 10.70 (*)    Calcium 8.5 (*)    Albumin 3.1 (*)    AST 89 (*)    ALT 66 (*)    Alkaline Phosphatase 256 (*)    GFR calc non Af Amer 5 (*)    GFR calc Af Amer 5 (*)    All other components within normal limits  CBC WITH DIFFERENTIAL/PLATELET - Abnormal; Notable for the following:    RBC 3.64 (*)    Hemoglobin 11.3 (*)    HCT 34.4 (*)    RDW 15.7 (*)    Lymphs Abs 0.3 (*)    All other components within normal limits  TROPONIN I - Abnormal; Notable for the following:    Troponin I 0.07 (*)    All other components within normal limits  CULTURE, BLOOD (ROUTINE X 2)  CULTURE, BLOOD (ROUTINE X 2)  LACTIC ACID, PLASMA  LACTIC ACID, PLASMA  VANCOMYCIN, TROUGH   ____________________________________________  EKG  ED ECG REPORT I, Doran Stabler, the attending physician, personally viewed and interpreted this ECG.   Date: 01/13/2015  EKG Time: 2239  Rate: 102  Rhythm: sinus tachycardia  Axis: Normal axis  Intervals:LVH with QRS widening.  ST&T Change: Very poor baseline. Machine reading acute MI/STEMI however we will need to repeat this EKG secondary to the poor baseline.  ED ECG REPORT I, Doran Stabler, the attending physician, personally viewed and interpreted this ECG.   Date: 01/13/2015  EKG Time: 2251  Rate: 97  Rhythm: sinus tachycardia  Axis: Left axis deviation  Intervals:LVH  with QRS widening  ST&T Change: T wave inversions in 1, V5 and V6 with 1 mm depression in V5 and V6.  Similar morphology to EKG from 03/12/2014. EKG with deeper V5 T wave than previous.  ____________________________________________  RADIOLOGY  Cardiomegaly with findings most suggestive of mild to moderate diffuse pulmonary edema. Bilateral pleural effusions right greater than left. Possible superimposed infiltrate not entirely excluded. I personally reviewed these images. ____________________________________________   PROCEDURES  CRITICAL CARE Performed by: Doran Stabler   Total critical care time: 35 minutes  Critical care time was exclusive of separately billable procedures and treating other patients.  Critical care was necessary to treat or prevent imminent or life-threatening deterioration.  Critical care was time spent personally by me on the following activities: development of treatment plan with patient and/or surrogate as well as nursing, discussions with consultants, evaluation of patient's response to treatment, examination of patient, obtaining history from patient or surrogate, ordering and performing treatments and interventions, ordering and review of laboratory studies, ordering and review of radiographic studies, pulse oximetry and re-evaluation of patient's condition.  Sepsis protocol initiated. ____________________________________________   INITIAL IMPRESSION / ASSESSMENT AND PLAN / ED COURSE  Pertinent labs & imaging results that were available during my care of the patient were reviewed by me and considered in my medical decision making (see chart for details).  ----------------------------------------- 12:30 AM on 01/13/2015 -----------------------------------------  His custom case with Maudie Mercury, the vascular surgery physician's assistant who says that it is okay to take down the dressing to evaluate the wound. I did tell the dressing and the suture  line is intact. There is some mild oozing of blood laterally. However, there is no pus or induration surrounding the wound. The wound is opened medially along the medial aspect of the great toe. The incision and suture line extending from just lateral to the base of the great toe all the way to what appears to be the distal fifth metatarsal. Wound was rewrapped with Adaptic, 4 x 4 and then Kling.  Patient's pain now controlled after several doses of IV opiates. Patient more alert and communicative with family. No clear source of infection. Possible bacteremia secondary to seeding from necrotic toe. We'll admit to the hospital. Signed out to Dr. Reece Levy. Patient's troponin appears to be at baseline due to renal failure. Does have slightly elevated potassium without any EKG changes indicative of symptomatic hyperkalemia. ____________________________________________   FINAL CLINICAL IMPRESSION(S) / ED DIAGNOSES  Acute fever and sepsis with altered mental status. Acute hyperkalemia. Initial visit.    Orbie Pyo,  MD 01/13/15 [id number]

## 2015-01-14 MED ORDER — IPRATROPIUM-ALBUTEROL 0.5-2.5 (3) MG/3ML IN SOLN: 3.0000 mL | Freq: Four times a day (QID) | RESPIRATORY_TRACT | Status: DC | PRN

## 2015-01-14 NOTE — Progress Notes (Signed)
Patient refused CHG bath performed by nurse tech. Pt refuses to allow nurse to assess skin. Pt refuses to have anyone to assist him to [facility]. Pt refused to take tylenol at 0625 for elevated temperature.

## 2015-01-14 NOTE — Progress Notes (Signed)
Patient being discharged back to [facility] manner. AVS printed and given to patient along with education on sepsis, fever, and wound care. Dressing changed at 1100 before discharge. Patient had a fever last night and per night shift patient refused to take tylenol. Tylenol was given this AM with morning meds. Temperature is currently 98.4. MD aware of temperature. Packet prepared by social work given to family member and instructed to give to [facility]. Report called to [facility] at [facility]. IV and tele discontinued.

## 2015-01-14 NOTE — Progress Notes (Signed)
ANTIBIOTIC CONSULT NOTE  Pharmacy Consult for vancomycin and Zosyn Indication: rule out sepsis  No Known Allergies  Patient Measurements: Height: 6\' 2"  (188 cm) Weight: 170 lb 11.2 oz (77.429 kg) IBW/kg (Calculated) : 82.2 Adjusted Body Weight: 80.7 kg  Vital Signs: Temp: 100.5 F (38.1 C) (08/20 0605) Temp Source: Oral (08/20 0605) BP: 125/82 mmHg (08/20 0605) Pulse Rate: 81 (08/20 0605) Intake/Output from previous day: 08/19 0701 - 08/20 0700 In: 200 [IV Piggyback:200] Out: 1589  Intake/Output from this shift:    Labs:  Recent Labs  01/12/15 2250 01/13/15 0153 01/13/15 0503  WBC 7.1 6.8 5.8  HGB 11.3* 10.4* 10.3*  PLT 184 167 164  CREATININE 10.70* 11.00* 11.46*   Estimated Creatinine Clearance: 7.5 mL/min (by C-G formula based on Cr of 11.46).  Recent Labs  01/13/15 0503  VANCOTROUGH 34*   * 8/19 -drawn after loading dose given, no HD or maintenance doses given  Microbiology: Recent Results (from the past 720 hour(s))  Surgical pcr screen     Status: Abnormal   Collection Time: 01/03/15  2:30 PM  Result Value Ref Range Status   MRSA, PCR POSITIVE (A) NEGATIVE Final   Staphylococcus aureus POSITIVE (A) NEGATIVE Final    Comment: CRITICAL VALUE CALLED BY TB AT 1823 01/03/15 TO MICHELLE HANKINS  Blood Culture (routine x 2)     Status: None (Preliminary result)   Collection Time: 01/12/15 11:18 PM  Result Value Ref Range Status   Specimen Description BLOOD  Final   Special Requests BOTTLES DRAWN AEROBIC AND ANAEROBIC 9ML  Final   Culture NO GROWTH 2 DAYS  Final   Report Status PENDING  Incomplete  Blood Culture (routine x 2)     Status: None (Preliminary result)   Collection Time: 01/12/15 11:28 PM  Result Value Ref Range Status   Specimen Description BLOOD  Final   Special Requests BOTTLES DRAWN AEROBIC AND ANAEROBIC 10ML  Final   Culture NO GROWTH 2 DAYS  Final   Report Status PENDING  Incomplete    Medical History: Past Medical History   Diagnosis Date  . End stage renal disease   . HTN (hypertension)   . HLD (hyperlipidemia)   . Anemia   . Hyperparathyroidism   . Dialysis patient     Mon, Wed, Fri  . CHF (congestive heart failure)   . Asthma   . COPD (chronic obstructive pulmonary disease)   . Complication of anesthesia     hypotension    Assessment: 30 yom with ESRD on HD MWF sp right foot transmetatarsal amputation starting antibiotics for sepsis. Patient last received dialysis on 8/19.   Goal of Therapy:  Pre-HD vancomycin trough of 15-89mcg/ml  Plan:  Will continue zosyn 3.375gm IV Q12H EI  Will continue patient on vancomycin 750mg  IV to be infused during the last hour of dialysis on dialysis days. Will obtain trough prior to dialysis on 8/24.    Pharmacy will continue to monitor and adjust per consult.   Currie Paris, PharmD Clinical Pharmacist  01/14/2015,8:36 AM

## 2015-01-14 NOTE — Clinical Social Work Note (Signed)
Physician has discharged patient to return to [facility]. Hilda Blades at [facility] is aware and nurse to call report. Patient to transport via EMS. Shela Leff MSW,LCSW [phone]

## 2015-01-14 NOTE — Progress Notes (Signed)
[facility]  ROUNDING NOTE   Subjective:  Pt doing well this AM.  Moderate pain control. Had HD yesterday and tolearted well.    Objective:  Vital signs in last 24 hours:  Temp:  [97.6 F (36.4 C)-100.5 F (38.1 C)] 100.5 F (38.1 C) (08/20 0605) Pulse Rate:  [73-91] 81 (08/20 0605) Resp:  [10-22] 22 (08/20 0605) BP: (113-147)/(73-101) 125/82 mmHg (08/20 0605) SpO2:  [92 %-100 %] 92 % (08/20 0605) Weight:  [76.8 kg (169 lb 5 oz)-80.8 kg (178 lb 2.1 oz)] 77.429 kg (170 lb 11.2 oz) (08/20 0605)  Weight change: 0.06 kg (2.1 oz) Filed Weights   01/13/15 1520 01/13/15 1941 01/14/15 0605  Weight: 80.8 kg (178 lb 2.1 oz) 76.8 kg (169 lb 5 oz) 77.429 kg (170 lb 11.2 oz)    Intake/Output: I/O last 3 completed shifts: In: 200 [IV Piggyback:200] Out: 1589 [Other:1589]   Intake/Output this shift:     Physical Exam: General: NAD, resting comfortably  Head: Normocephalic, atraumatic. Moist oral mucosal membranes  Eyes: Anicteric  Neck: Supple, trachea midline  Lungs:  Clear to auscultation  Heart: Regular rate and rhythm  Abdomen:  Soft, nontender, BS present  Extremities:  trace peripheral edema, right foot wrapped, L BKA  Neurologic: Nonfocal, moves both UE's  Skin: No lesions  Access: RUE AVF    Basic Metabolic Panel:  Recent Labs Lab 01/09/15 1000 01/11/15 1030 01/12/15 1210 01/12/15 2250 01/13/15 0153 01/13/15 0503 01/13/15 1348  NA  --   --   --  132*  --  134*  --   K 3.8 5.0 5.2 5.2*  --  4.9  --   CL  --   --   --  96*  --  97*  --   CO2  --   --   --  23  --  29  --   GLUCOSE  --   --   --  82  --  109*  --   BUN  --   --   --  50*  --  55*  --   CREATININE  --   --   --  10.70* 11.00* 11.46*  --   CALCIUM  --   --   --  8.5*  --  8.4*  --   PHOS  --   --   --   --   --   --  5.0*    Liver Function Tests:  Recent Labs Lab 01/12/15 2250 01/13/15 0503  AST 89* 86*  ALT 66* 62  ALKPHOS 256* 216*  BILITOT 1.0 0.9  PROT 8.1 7.1   ALBUMIN 3.1* 2.8*   No results for input(s): LIPASE, AMYLASE in the last 168 hours. No results for input(s): AMMONIA in the last 168 hours.  CBC:  Recent Labs Lab 01/12/15 2250 01/13/15 0153 01/13/15 0503  WBC 7.1 6.8 5.8  NEUTROABS 5.5  --   --   HGB 11.3* 10.4* 10.3*  HCT 34.4* 31.7* 31.0*  MCV 94.6 94.3 95.0  PLT 184 167 164    Cardiac Enzymes:  Recent Labs Lab 01/12/15 2250 01/13/15 0153 01/13/15 0503 01/13/15 1348  TROPONINI 0.07* 0.07* 0.07* 0.08*    BNP: Invalid input(s): POCBNP  CBG:  Recent Labs Lab 01/12/15 1159 01/12/15 1305 01/12/15 1449 01/12/15 1534 01/12/15 1630  GLUCAP 64* 82 58* 74 26    Microbiology: Results for orders placed or performed during the hospital encounter of 01/12/15  Blood Culture (routine x  2)     Status: None (Preliminary result)   Collection Time: 01/12/15 11:18 PM  Result Value Ref Range Status   Specimen Description BLOOD  Final   Special Requests BOTTLES DRAWN AEROBIC AND ANAEROBIC 9ML  Final   Culture NO GROWTH < 12 HOURS  Final   Report Status PENDING  Incomplete  Blood Culture (routine x 2)     Status: None (Preliminary result)   Collection Time: 01/12/15 11:28 PM  Result Value Ref Range Status   Specimen Description BLOOD  Final   Special Requests BOTTLES DRAWN AEROBIC AND ANAEROBIC 10ML  Final   Culture NO GROWTH < 12 HOURS  Final   Report Status PENDING  Incomplete    Coagulation Studies: No results for input(s): LABPROT, INR in the last 72 hours.  Urinalysis: No results for input(s): COLORURINE, LABSPEC, PHURINE, GLUCOSEU, HGBUR, BILIRUBINUR, KETONESUR, PROTEINUR, UROBILINOGEN, NITRITE, LEUKOCYTESUR in the last 72 hours.  Invalid input(s): APPERANCEUR    Imaging: Dg Chest Port 1 View  01/13/2015   CLINICAL DATA:  Initial valuation acute fever, weakness.  EXAM: PORTABLE CHEST - 1 VIEW  COMPARISON:  Prior study from 01/03/2015  FINDINGS: Cardiomegaly is stable from prior. Mediastinal silhouette  within normal limits.  The lungs are hypoinflated. Diffuse pulmonary vascular congestion with indistinctness of the interstitial markings most suggestive of pulmonary edema. Probable small bilateral pleural effusions present. Superimposed infectious infiltrate at the right lung base not entirely excluded. No other focal airspace disease.  No acute osseous abnormality.  IMPRESSION: 1. Cardiomegaly with findings most suggestive of mild to moderate diffuse pulmonary edema. 2. Bilateral pleural effusions, right greater than left. Patchy opacity at the right lung base favored to reflect edema and/or effusion, although superimposed infiltrate not entirely excluded   Electronically Signed   By: Jeannine Boga M.D.   On: 01/13/2015 00:19     Medications:     . aspirin  325 mg Oral Daily  . budesonide-formoterol  2 puff Inhalation BID  . calcium acetate  667 mg Oral TID WC  . Chlorhexidine Gluconate Cloth  6 each Topical Q0600  . docusate sodium  100 mg Oral BID  . doxercalciferol  2.5 mcg Intravenous Q M,W,F-HD  . feeding supplement (NEPRO CARB STEADY)  237 mL Oral BID WC  . ferric gluconate (FERRLECIT/NULECIT) IV  125 mg Intravenous Q M,W,F-HD  . gabapentin  300 mg Oral QHS  . heparin  5,000 Units Subcutaneous 3 times per day  . metoprolol tartrate  25 mg Oral BID  . multivitamin  1 tablet Oral QHS  . mupirocin ointment  1 application Nasal BID  . nicotine  21 mg Transdermal Daily  . pantoprazole  40 mg Oral QAC breakfast  . piperacillin-tazobactam (ZOSYN)  IV  3.375 g Intravenous Q12H  . senna  1 tablet Oral Daily  . sevelamer carbonate  800 mg Oral TID WC  . simvastatin  10 mg Oral QPM  . tiotropium  18 mcg Inhalation Daily  . vancomycin  750 mg Intravenous Q M,W,F-HD   sodium chloride, sodium chloride, acetaminophen, alteplase, heparin, ipratropium-albuterol, lidocaine (PF), lidocaine-prilocaine, morphine injection, ondansetron **OR** ondansetron (ZOFRAN) IV, ondansetron, oxyCODONE,  pentafluoroprop-tetrafluoroeth, sodium chloride  Assessment/ Plan:  59 y.o. male with past medical history of end-stage renal disease on hemodialysis Monday, Wednesday, Friday, hypertension, hyperlipidemia, anemia of chronic kidney disease, secondary hyperparathyroidism, congestive heart failure, COPD, peripheral vascular disease, left below the knee amputation who underwent amputation of second through fourth right toes on 01/12/2015. Patient came back  with lethargy and altered mental status.  1. End-stage renal disease on hemodialysis Monday, Wednesday, Friday. Pt had HD yesterday, no acute indication for HD today, next HD on Monday, most likely as outpt.  2. Anemia of chronic kidney disease. hgb 10.3 resume epogen as outpt.   3. Secondary hyperparathyroidism. Phos 5.0, continue renvela and phoslo.  4. Hypertension. Blood pressure 125/82, continue metoprolol 25 mg by mouth twice a day.    LOS: 1 Karel Mowers 8/20/20167:50 AM

## 2015-01-14 NOTE — Discharge Summary (Signed)
[facility] NAME: [name]    MR#:  [id number]  DATE OF BIRTH:  25-Jul-1955  DATE OF ADMISSION:  01/12/2015 ADMITTING PHYSICIAN: Juluis Mire, MD  DATE OF DISCHARGE: 01/14/15  PRIMARY CARE PHYSICIAN: Alvester Morin, MD    ADMISSION DIAGNOSIS:  Hyperkalemia [E87.5] Fever, unspecified fever cause [R50.9] Altered mental status, unspecified altered mental status type [R41.82]  DISCHARGE DIAGNOSIS:  Post-op fever-improved S/p right 2,3,4,5 th toes amputation ESRD on HD  SECONDARY DIAGNOSIS:   Past Medical History  Diagnosis Date  . End stage renal disease   . HTN (hypertension)   . HLD (hyperlipidemia)   . Anemia   . Hyperparathyroidism   . Dialysis patient     Mon, Wed, Fri  . CHF (congestive heart failure)   . Asthma   . COPD (chronic obstructive pulmonary disease)   . Complication of anesthesia     hypotension    HOSPITAL COURSE:   58 year old male, nursing home resident, status post amputation of right footfor gangrene done earlier today was brought in with the complaints of right foot pain and lethargy with altered sensorium. 1. Fever secondary to  status post amputation of right foot toes done 2 days ago -doing well -seen by vascular Dr dew. Ok to go back to [facility] and cont dressing changes per instructions -tylenol prn -appears more so due to post surgical inflammatory changes. No more spikes in fever -BC negative -pt was placed Empirically on Vanc +zosyn. D/c today 2. Lethargy/altered sensorium-acute encephalopathy, likely secondary to above. -mentation back to normal 3. End-stage renal disease on hemodialysis-Monday, Wednesday, Friday. Last hemodialysis on Wednesday. Creatinine 10.7, potassium 5.2. Nephrology consultation requested for hemodialysis 4. Hyperkalemia. Marland Kitchenk down to 4.9 5. Hypertension, stable on home medications.  6 coronary artery disease status post stent, stable, no  symptoms. EKG no acute ischemic changes. Troponin elevated at 0.07, chronic, likely related to end-stage renal disease.  7. Chronic systolic congestive heart failure, stable clinically. 8. COPD, stable on home medications. Continue same. 9. Hyperlipidemia, stable on statin. Continue same. Overall better D/c to [facility] DISCHARGE CONDITIONS:   fair CONSULTS OBTAINED:  Treatment Team:  Anthonette Legato, MD Katha Cabal, MD  DRUG ALLERGIES:  No Known Allergies  DISCHARGE MEDICATIONS:   Current Discharge Medication List    CONTINUE these medications which have NOT CHANGED   Details  acetaminophen (TYLENOL) 500 MG tablet Take 1,000 mg by mouth every 6 (six) hours as needed for moderate pain.    albuterol (PROVENTIL HFA;VENTOLIN HFA) 108 (90 BASE) MCG/ACT inhaler Inhale 1 puff into the lungs every 6 (six) hours as needed for wheezing or shortness of breath.    aspirin 325 MG tablet Take 325 mg by mouth daily.     budesonide-formoterol (SYMBICORT) 160-4.5 MCG/ACT inhaler Inhale 2 puffs into the lungs 2 (two) times daily.    calcium acetate (PHOSLO) 667 MG capsule Take 1 capsule (667 mg total) by mouth 3 (three) times daily with meals.    Darbepoetin Alfa (ARANESP) 40 MCG/0.4ML SOSY injection Inject 0.4 mLs (40 mcg total) into the vein every Monday with hemodialysis. Qty: 8.4 mL    docusate sodium (COLACE) 100 MG capsule Take 100 mg by mouth 2 (two) times daily.    doxercalciferol (HECTOROL) 4 MCG/2ML injection Inject 1.25 mLs (2.5 mcg total) into the vein every Monday, Wednesday, and Friday with hemodialysis. Qty: 2 mL    ferric gluconate 125 mg in sodium chloride 0.9 %  100 mL Inject 125 mg into the vein every Monday, Wednesday, and Friday with hemodialysis.    gabapentin (NEURONTIN) 300 MG capsule Take 1 capsule by mouth at bedtime.    ibuprofen (ADVIL,MOTRIN) 600 MG tablet Take 600 mg by mouth every 6 (six) hours as needed.    metoprolol tartrate (LOPRESSOR) 25 MG tablet Take  25 mg by mouth 2 (two) times daily.      midodrine (PROAMATINE) 5 MG tablet Take 5 mg by mouth. 3 times a week, 30 minutes before dialysis    multivitamin (RENA-VIT) TABS tablet Take 1 tablet by mouth at bedtime. Refills: 0    nicotine (NICODERM CQ - DOSED IN MG/24 HOURS) 21 mg/24hr patch Place 1 patch (21 mg total) onto the skin daily. Qty: 28 patch, Refills: 0    Nutritional Supplements (FEEDING SUPPLEMENT, NEPRO CARB STEADY,) LIQD Take 237 mLs by mouth daily.    ondansetron (ZOFRAN) 4 MG tablet Take 1 tablet (4 mg total) by mouth every 6 (six) hours as needed for nausea. Qty: 20 tablet, Refills: 0    oxyCODONE (OXY IR/ROXICODONE) 5 MG immediate release tablet Take 1-2 tablets (5-10 mg total) by mouth every 4 (four) hours as needed for moderate pain. Qty: 30 tablet, Refills: 0    pantoprazole (PROTONIX) 20 MG tablet Take 1 tablet by mouth daily.    SANTYL ointment Apply 1 application topically as needed.    sevelamer carbonate (RENVELA) 800 MG tablet Take 800 mg by mouth 3 (three) times daily with meals.    simvastatin (ZOCOR) 10 MG tablet Take 1 tablet by mouth every evening.    tiotropium (SPIRIVA) 18 MCG inhalation capsule Place 18 mcg into inhaler and inhale daily.    triamcinolone cream (KENALOG) 0.1 % Apply 1 application topically daily as needed.        If you experience worsening of your admission symptoms, develop shortness of breath, life threatening emergency, suicidal or homicidal thoughts you must seek medical attention immediately by calling 911 or calling your MD immediately  if symptoms less severe.  You Must read complete instructions/literature along with all the possible adverse reactions/side effects for all the Medicines you take and that have been prescribed to you. Take any new Medicines after you have completely understood and accept all the possible adverse reactions/side effects.   Please note  You were cared for by a hospitalist during your hospital  stay. If you have any questions about your discharge medications or the care you received while you were in the hospital after you are discharged, you can call the unit and asked to speak with the hospitalist on call if the hospitalist that took care of you is not available. Once you are discharged, your primary care physician will handle any further medical issues. Please note that NO REFILLS for any discharge medications will be authorized once you are discharged, as it is imperative that you return to your primary care physician (or establish a relationship with a primary care physician if you do not have one) for your aftercare needs so that they can reassess your need for medications and monitor your lab values. Today   SUBJECTIVE   Much pleasant today. Got HD y'day  VITAL SIGNS:  Blood pressure 125/82, pulse 81, temperature 100.5 F (38.1 C), temperature source Oral, resp. rate 22, height 6\' 2"  (1.88 m), weight 77.429 kg (170 lb 11.2 oz), SpO2 92 %.  I/O:   Intake/Output Summary (Last 24 hours) at 01/14/15 0846 Last data filed at  01/13/15 1830  Gross per 24 hour  Intake    200 ml  Output   1589 ml  Net  -1389 ml    PHYSICAL EXAMINATION:  GENERAL:  59 y.o.-year-old patient lying in the bed with no acute distress.  EYES: Pupils equal, round, reactive to light and accommodation. No scleral icterus. Extraocular muscles intact.  HEENT: Head atraumatic, normocephalic. Oropharynx and nasopharynx clear.  NECK:  Supple, no jugular venous distention. No thyroid enlargement, no tenderness.  LUNGS: Normal breath sounds bilaterally, no wheezing, rales,rhonchi or crepitation. No use of accessory muscles of respiration.  CARDIOVASCULAR: S1, S2 normal. No murmurs, rubs, or gallops.  ABDOMEN: Soft, non-tender, non-distended. Bowel sounds present. No organomegaly or mass.  EXTREMITIES: No pedal edema, cyanosis, or clubbing.  -right foot surgical dressing + NEUROLOGIC: Cranial nerves II through XII  are intact. Muscle strength 5/5 in all extremities. Sensation intact. Gait not checked.  PSYCHIATRIC: The patient is alert and oriented x 3.  SKIN: No obvious rash, lesion, or ulcer.   DATA REVIEW:   CBC   Recent Labs Lab 01/13/15 0503  WBC 5.8  HGB 10.3*  HCT 31.0*  PLT 164    Chemistries   Recent Labs Lab 01/13/15 0503  NA 134*  K 4.9  CL 97*  CO2 29  GLUCOSE 109*  BUN 55*  CREATININE 11.46*  CALCIUM 8.4*  AST 86*  ALT 62  ALKPHOS 216*  BILITOT 0.9    Microbiology Results   Recent Results (from the past 240 hour(s))  Blood Culture (routine x 2)     Status: None (Preliminary result)   Collection Time: 01/12/15 11:18 PM  Result Value Ref Range Status   Specimen Description BLOOD  Final   Special Requests BOTTLES DRAWN AEROBIC AND ANAEROBIC 9ML  Final   Culture NO GROWTH 2 DAYS  Final   Report Status PENDING  Incomplete  Blood Culture (routine x 2)     Status: None (Preliminary result)   Collection Time: 01/12/15 11:28 PM  Result Value Ref Range Status   Specimen Description BLOOD  Final   Special Requests BOTTLES DRAWN AEROBIC AND ANAEROBIC 10ML  Final   Culture NO GROWTH 2 DAYS  Final   Report Status PENDING  Incomplete    RADIOLOGY:  Dg Chest Port 1 View  01/13/2015   CLINICAL DATA:  Initial valuation acute fever, weakness.  EXAM: PORTABLE CHEST - 1 VIEW  COMPARISON:  Prior study from 01/03/2015  FINDINGS: Cardiomegaly is stable from prior. Mediastinal silhouette within normal limits.  The lungs are hypoinflated. Diffuse pulmonary vascular congestion with indistinctness of the interstitial markings most suggestive of pulmonary edema. Probable small bilateral pleural effusions present. Superimposed infectious infiltrate at the right lung base not entirely excluded. No other focal airspace disease.  No acute osseous abnormality.  IMPRESSION: 1. Cardiomegaly with findings most suggestive of mild to moderate diffuse pulmonary edema. 2. Bilateral pleural  effusions, right greater than left. Patchy opacity at the right lung base favored to reflect edema and/or effusion, although superimposed infiltrate not entirely excluded   Electronically Signed   By: Jeannine Boga M.D.   On: 01/13/2015 00:19     Management plans discussed with the patient, family and they are in agreement. D/w dr Wilford Grist (vascular sx)  CODE STATUS:     Code Status Orders        Start     Ordered   01/13/15 0145  Full code   Continuous     01/13/15 0144  TOTAL TIME TAKING CARE OF THIS PATIENT: 40 minutes.    Eliah Marquard M.D on 01/14/2015 at 8:46 AM  Between 7am to 6pm - Pager - [phone] After 6pm go to [email or web address] - password [email or web address] [facility]  Office  [phone]  CC: Primary care physician; Alvester Morin, MD

## 2015-01-15 LAB — HEPATITIS B SURFACE ANTIGEN: HEP B S AG: NEGATIVE

## 2015-01-15 LAB — HEPATITIS C ANTIBODY

## 2015-01-15 LAB — HEPATITIS B SURFACE ANTIBODY, QUANTITATIVE: Hepatitis B-Post: 7.1 m[IU]/mL — ABNORMAL LOW (ref >9.9)

## 2015-01-16 LAB — SURGICAL PATHOLOGY

## 2015-01-17 LAB — CULTURE, BLOOD (ROUTINE X 2)
Culture: NO GROWTH
Culture: NO GROWTH

## 2015-02-28 ENCOUNTER — Other Ambulatory Visit: Payer: Self-pay | Admitting: Vascular Surgery

## 2015-02-28 ENCOUNTER — Encounter: Payer: Self-pay | Admitting: *Deleted

## 2015-02-28 ENCOUNTER — Inpatient Hospital Stay: Admission: RE | Admit: 2015-02-28 | Payer: Medicare Other | Source: Ambulatory Visit

## 2015-02-28 NOTE — Patient Instructions (Signed)
  Your procedure is scheduled on: 03-01-15 Report to [facility] @ 11 AM   Remember: Instructions that are not followed completely may result in serious medical risk, up to and including death, or upon the discretion of your surgeon and anesthesiologist your surgery may need to be rescheduled.    _X___ 1. Do not eat food or drink liquids after midnight. No gum chewing or hard candies.     ____ 2. No Alcohol for 24 hours before or after surgery.   ____ 3. Bring all medications with you on the day of surgery if instructed.    ____ 4. Notify your doctor if there is any change in your medical condition     (cold, fever, infections).     Do not wear jewelry, make-up, hairpins, clips or nail polish.  Do not wear lotions, powders, or perfumes. You may wear deodorant.  Do not shave 48 hours prior to surgery. Men may shave face and neck.  Do not bring valuables to the hospital.    [facility] is not responsible for any belongings or valuables.               Contacts, dentures or bridgework may not be worn into surgery.  Leave your suitcase in the car. After surgery it may be brought to your room.  For patients admitted to the hospital, discharge time is determined by your  treatment team.   Patients discharged the day of surgery will not be allowed to drive home.   Please read over the following fact sheets that you were given:    _X___ Take these medicines the morning of surgery with A SIP OF WATER:    1. MIDODRINE  2. PROTONIX  3. METOPROLOL  4.  5.  6.  ____ Fleet Enema (as directed)   ____ Use CHG Soap as directed  _X___ Use inhalers on the day of surgery-DO NEBULIZER, SYMBICORT AND [place]  ____ Stop metformin 2 days prior to surgery    ____ Take 1/2 of usual insulin dose the night before surgery and none on the morning of surgery.   __X__ Stop Coumadin/Plavix/aspirin-DO NOT TAKE ASPIRIN THE DAY OF SURGERY  ____ Stop  Anti-inflammatories   ____ Stop supplements until after surgery.    ____ Bring C-Pap to the hospital.

## 2015-03-01 ENCOUNTER — Ambulatory Visit
Admission: RE | Admit: 2015-03-01 | Discharge: 2015-03-01 | Disposition: A | Discharge status: Skilled Nursing Facility | Payer: Medicare Other | Source: Ambulatory Visit | Attending: Vascular Surgery | Admitting: Vascular Surgery

## 2015-03-01 ENCOUNTER — Encounter
Admission: RE | Disposition: A | Discharge status: Skilled Nursing Facility | Payer: Self-pay | Source: Ambulatory Visit | Attending: Vascular Surgery

## 2015-03-01 ENCOUNTER — Ambulatory Visit: Payer: Medicare Other | Admitting: Anesthesiology

## 2015-03-01 ENCOUNTER — Encounter: Payer: Self-pay | Admitting: *Deleted

## 2015-03-01 DIAGNOSIS — T8789 Other complications of amputation stump: Secondary | ICD-10-CM | POA: Insufficient documentation

## 2015-03-01 DIAGNOSIS — N186 End stage renal disease: Secondary | ICD-10-CM | POA: Diagnosis not present

## 2015-03-01 DIAGNOSIS — Z89421 Acquired absence of other right toe(s): Secondary | ICD-10-CM | POA: Diagnosis not present

## 2015-03-01 DIAGNOSIS — E7800 Pure hypercholesterolemia, unspecified: Secondary | ICD-10-CM | POA: Diagnosis not present

## 2015-03-01 DIAGNOSIS — I739 Peripheral vascular disease, unspecified: Secondary | ICD-10-CM | POA: Diagnosis not present

## 2015-03-01 DIAGNOSIS — I132 Hypertensive heart and chronic kidney disease with heart failure and with stage 5 chronic kidney disease, or end stage renal disease: Secondary | ICD-10-CM | POA: Insufficient documentation

## 2015-03-01 DIAGNOSIS — F17200 Nicotine dependence, unspecified, uncomplicated: Secondary | ICD-10-CM | POA: Insufficient documentation

## 2015-03-01 DIAGNOSIS — Z79899 Other long term (current) drug therapy: Secondary | ICD-10-CM | POA: Diagnosis not present

## 2015-03-01 DIAGNOSIS — Z7951 Long term (current) use of inhaled steroids: Secondary | ICD-10-CM | POA: Diagnosis not present

## 2015-03-01 DIAGNOSIS — I509 Heart failure, unspecified: Secondary | ICD-10-CM | POA: Insufficient documentation

## 2015-03-01 DIAGNOSIS — T8189XA Other complications of procedures, not elsewhere classified, initial encounter: Secondary | ICD-10-CM | POA: Insufficient documentation

## 2015-03-01 DIAGNOSIS — S91311A Laceration without foreign body, right foot, initial encounter: Secondary | ICD-10-CM | POA: Diagnosis present

## 2015-03-01 HISTORY — PX: APPLICATION OF WOUND VAC: SHX5189

## 2015-03-01 HISTORY — DX: Reserved for inherently not codable concepts without codable children: IMO0001

## 2015-03-01 HISTORY — DX: Gastro-esophageal reflux disease without esophagitis: K21.9

## 2015-03-01 HISTORY — DX: Peripheral vascular disease, unspecified: I73.9

## 2015-03-01 LAB — ABO/RH: ABO/RH(D): B POS

## 2015-03-01 LAB — BASIC METABOLIC PANEL
ANION GAP: 11 (ref 5–15)
BUN: 26 mg/dL — ABNORMAL HIGH (ref 6–20)
CALCIUM: 9.1 mg/dL (ref 8.9–10.3)
CO2: 29 mmol/L (ref 22–32)
CREATININE: 5.87 mg/dL — AB (ref 0.61–1.24)
Chloride: 97 mmol/L — ABNORMAL LOW (ref 101–111)
GFR, EST AFRICAN AMERICAN: 11 mL/min — AB (ref >60)
GFR, EST NON AFRICAN AMERICAN: 9 mL/min — AB (ref >60)
Glucose, Bld: 85 mg/dL (ref 65–99)
Potassium: 3.9 mmol/L (ref 3.5–5.1)
SODIUM: 137 mmol/L (ref 135–145)

## 2015-03-01 LAB — GLUCOSE, CAPILLARY
GLUCOSE-CAPILLARY: 66 mg/dL (ref 65–99)
GLUCOSE-CAPILLARY: 71 mg/dL (ref 65–99)
Glucose-Capillary: 121 mg/dL — ABNORMAL HIGH (ref 65–99)

## 2015-03-01 LAB — CBC WITH DIFFERENTIAL/PLATELET
BASOS ABS: 0 10*3/uL (ref 0–0.1)
BASOS PCT: 1 %
EOS ABS: 0.1 10*3/uL (ref 0–0.7)
Eosinophils Relative: 1 %
HEMATOCRIT: 36 % — AB (ref 40.0–52.0)
HEMOGLOBIN: 11.9 g/dL — AB (ref 13.0–18.0)
Lymphocytes Relative: 22 %
Lymphs Abs: 1.4 10*3/uL (ref 1.0–3.6)
MCH: 32.2 pg (ref 26.0–34.0)
MCHC: 33.1 g/dL (ref 32.0–36.0)
MCV: 97.3 fL (ref 80.0–100.0)
Monocytes Absolute: 0.9 10*3/uL (ref 0.2–1.0)
Monocytes Relative: 14 %
NEUTROS ABS: 3.7 10*3/uL (ref 1.4–6.5)
NEUTROS PCT: 62 %
Platelets: 150 10*3/uL (ref 150–440)
RBC: 3.7 MIL/uL — AB (ref 4.40–5.90)
RDW: 15.9 % — ABNORMAL HIGH (ref 11.5–14.5)
WBC: 6 10*3/uL (ref 3.8–10.6)

## 2015-03-01 LAB — APTT: aPTT: 31 seconds (ref 24–36)

## 2015-03-01 LAB — PROTIME-INR
INR: 1.06
PROTHROMBIN TIME: 14 s (ref 11.4–15.0)

## 2015-03-01 SURGERY — APPLICATION, WOUND VAC
Anesthesia: General | Laterality: Right | Wound class: Dirty or Infected

## 2015-03-01 MED ORDER — CEFAZOLIN SODIUM-DEXTROSE 2-3 GM-% IV SOLR
INTRAVENOUS | Status: AC
  Filled 2015-03-01: qty 50

## 2015-03-01 MED ORDER — CEFAZOLIN SODIUM-DEXTROSE 2-3 GM-% IV SOLR: 2.0000 g | INTRAVENOUS | Status: DC

## 2015-03-01 MED ORDER — SODIUM CHLORIDE 0.9 % IV SOLN
INTRAVENOUS | Status: DC
  Administered 2015-03-01: 13:00:00 via INTRAVENOUS

## 2015-03-01 MED ORDER — PROPOFOL 10 MG/ML IV BOLUS
INTRAVENOUS | Status: DC | PRN
  Administered 2015-03-01: 150 mg via INTRAVENOUS

## 2015-03-01 MED ORDER — FENTANYL CITRATE (PF) 100 MCG/2ML IJ SOLN
INTRAMUSCULAR | Status: DC | PRN
  Administered 2015-03-01: 100 ug via INTRAVENOUS

## 2015-03-01 MED ORDER — OXYCODONE-ACETAMINOPHEN 7.5-325 MG PO TABS: 1.0000 | ORAL_TABLET | ORAL | Status: DC | PRN

## 2015-03-01 MED ORDER — LIDOCAINE HCL (CARDIAC) 20 MG/ML IV SOLN
INTRAVENOUS | Status: DC | PRN
  Administered 2015-03-01: 100 mg via INTRAVENOUS

## 2015-03-01 MED ORDER — BUPIVACAINE HCL (PF) 0.5 % IJ SOLN
INTRAMUSCULAR | Status: AC
  Filled 2015-03-01: qty 30

## 2015-03-01 MED ORDER — LIDOCAINE HCL (PF) 1 % IJ SOLN
INTRAMUSCULAR | Status: AC
  Filled 2015-03-01: qty 30

## 2015-03-01 MED ORDER — FENTANYL CITRATE (PF) 100 MCG/2ML IJ SOLN: 25.0000 ug | INTRAMUSCULAR | Status: DC | PRN

## 2015-03-01 MED ORDER — ONDANSETRON HCL 4 MG/2ML IJ SOLN: 4.0000 mg | Freq: Once | INTRAMUSCULAR | Status: DC | PRN

## 2015-03-01 MED ORDER — MIDAZOLAM HCL 2 MG/2ML IJ SOLN
INTRAMUSCULAR | Status: DC | PRN
  Administered 2015-03-01: 2 mg via INTRAVENOUS

## 2015-03-01 SURGICAL SUPPLY — 38 items
BIO-CONNEKT IMPLANT
BRUSH SCRUB 4% CHG (MISCELLANEOUS) IMPLANT
CANISTER SUCT 1200ML W/VALVE (MISCELLANEOUS) ×3 IMPLANT
CHLORAPREP W/TINT 26ML (MISCELLANEOUS) IMPLANT
DRAPE INCISE IOBAN 66X45 STRL (DRAPES) ×3 IMPLANT
DRAPE LAPAROTOMY T 102X78X121 (DRAPES) ×3 IMPLANT
DRSG EMULSION OIL 3X8 NADH (GAUZE/BANDAGES/DRESSINGS) ×3 IMPLANT
DRSG VAC ATS MED SENSATRAC (GAUZE/BANDAGES/DRESSINGS) IMPLANT
ELECT CAUTERY BLADE 6.4 (BLADE) ×3 IMPLANT
GLOVE BIO SURGEON STRL SZ7 (GLOVE) ×15 IMPLANT
GOWN STRL REUS W/ TWL LRG LVL3 (GOWN DISPOSABLE) ×2 IMPLANT
GOWN STRL REUS W/ TWL XL LVL3 (GOWN DISPOSABLE) ×1 IMPLANT
GOWN STRL REUS W/TWL LRG LVL3 (GOWN DISPOSABLE) ×4
GOWN STRL REUS W/TWL XL LVL3 (GOWN DISPOSABLE) ×2
HANDPIECE SUCTION TUBG SURGILV (MISCELLANEOUS) IMPLANT
IV NS 1000ML (IV SOLUTION)
IV NS 1000ML BAXH (IV SOLUTION) IMPLANT
KIT RM TURNOVER STRD PROC AR (KITS) ×3 IMPLANT
LABEL OR SOLS (LABEL) IMPLANT
MATRIX WOUND BIOCONNEKT 2X2CM (Collagen) ×3 IMPLANT
MATRIX WOUND BIOCONNEKT 3X3CM (Collagen) ×3 IMPLANT
NS IRRIG 1000ML POUR BTL (IV SOLUTION) ×3 IMPLANT
NS IRRIG 500ML POUR BTL (IV SOLUTION) ×3 IMPLANT
PACK BASIN MINOR ARMC (MISCELLANEOUS) IMPLANT
PACK EXTREMITY ARMC (MISCELLANEOUS) ×3 IMPLANT
PAD GROUND ADULT SPLIT (MISCELLANEOUS) ×3 IMPLANT
PAD PREP 24X41 OB/GYN DISP (PERSONAL CARE ITEMS) ×3 IMPLANT
SOL PREP PVP 2OZ (MISCELLANEOUS) ×3
SOLUTION PREP PVP 2OZ (MISCELLANEOUS) ×1 IMPLANT
SPONGE LAP 18X18 5 PK (GAUZE/BANDAGES/DRESSINGS) ×3 IMPLANT
SUT ETHILON 4-0 (SUTURE)
SUT ETHILON 4-0 FS2 18XMFL BLK (SUTURE)
SUT VIC AB 3-0 SH 27 (SUTURE)
SUT VIC AB 3-0 SH 27X BRD (SUTURE) IMPLANT
SUTURE ETHLN 4-0 FS2 18XMF BLK (SUTURE) IMPLANT
SWAB CULTURE AMIES ANAERIB BLU (MISCELLANEOUS) IMPLANT
SYR BULB EAR ULCER 3OZ GRN STR (SYRINGE) IMPLANT
WND VAC CANISTER 500ML (MISCELLANEOUS) IMPLANT

## 2015-03-01 NOTE — Transfer of Care (Signed)
Immediate Anesthesia Transfer of Care Note  Patient: [name]  Procedure(s) Performed: Procedure(s): APPLICATION OF WOUND VAC (Right) IRRIGATION AND DEBRIDEMENT EXTREMITY (Right)  Patient Location: PACU  Anesthesia Type:General  Level of Consciousness: sedated  Airway & Oxygen Therapy: Patient Spontanous Breathing and Patient connected to face mask oxygen  Post-op Assessment: Report given to RN and Post -op Vital signs reviewed and stable  Post vital signs: Reviewed and stable  Last Vitals:  Filed Vitals:   03/01/15 1104  BP: 142/97  Pulse: 78  Temp: 36.6 C  Resp: 16    Complications: No apparent anesthesia complications

## 2015-03-01 NOTE — Anesthesia Preprocedure Evaluation (Signed)
Anesthesia Evaluation  Patient identified by MRN, date of birth, ID band Patient awake    Reviewed: Allergy & Precautions, NPO status , Patient's Chart, lab work & pertinent test results, reviewed documented beta blocker date and time   History of Anesthesia Complications (+) history of anesthetic complications  Airway Mallampati: II  TM Distance: >3 FB     Dental  (+) Chipped   Pulmonary shortness of breath, asthma , COPD,  COPD inhaler, former smoker,           Cardiovascular hypertension, Pt. on medications and Pt. on home beta blockers + CAD, + Peripheral Vascular Disease and +CHF       Neuro/Psych    GI/Hepatic GERD  ,  Endo/Other  diabetes, Type 2  Renal/GU ESRFRenal disease     Musculoskeletal   Abdominal   Peds  Hematology  (+) anemia ,   Anesthesia Other Findings   Reproductive/Obstetrics                             Anesthesia Physical Anesthesia Plan  ASA: III  Anesthesia Plan: General   Post-op Pain Management:    Induction: Intravenous  Airway Management Planned: LMA  Additional Equipment:   Intra-op Plan:   Post-operative Plan:   Informed Consent: I have reviewed the patients History and Physical, chart, labs and discussed the procedure including the risks, benefits and alternatives for the proposed anesthesia with the patient or authorized representative who has indicated his/her understanding and acceptance.     Plan Discussed with: CRNA  Anesthesia Plan Comments:         Anesthesia Quick Evaluation

## 2015-03-01 NOTE — Anesthesia Postprocedure Evaluation (Signed)
  Anesthesia Post-op Note  Patient: [name]  Procedure(s) Performed: Procedure(s): Application of Bio-connekt graft and wound vac application to right foot  (Right)  Anesthesia type:General  Patient location: PACU  Post pain: Pain level controlled  Post assessment: Post-op Vital signs reviewed, Patient's Cardiovascular Status Stable, Respiratory Function Stable, Patent Airway and No signs of Nausea or vomiting  Post vital signs: Reviewed and stable  Last Vitals:  Filed Vitals:   03/01/15 1530  BP: 141/84  Pulse: 68  Temp:   Resp:     Level of consciousness: awake, alert  and patient cooperative  Complications: No apparent anesthesia complications

## 2015-03-01 NOTE — H&P (Signed)
[facility] History & Physical Update  The patient was interviewed and re-examined.  The patient's previous History and Physical has been reviewed and is unchanged.  There is no change in the plan of care. We plan to proceed with the scheduled procedure.  Ramadan Couey, MD  03/01/2015, 12:15 PM

## 2015-03-01 NOTE — Progress Notes (Signed)
PIV attempted x 3 with no success, patient stated no more sticks, anesthesia and Dr. Lucky Cowboy made aware, CRNA suggested Nitrous gas to be used with no IV. If anesthesia is ok with that plan then proceed. Dr. Lucky Cowboy dc scds and ancef.

## 2015-03-01 NOTE — Op Note (Signed)
    OPERATIVE NOTE   PROCEDURE: 1. Irrigation and debridement of 20 cm2 of skin and soft tissue of right foot wound 2. Bio-connect skin graft to right foot wound 3. Negative pressure dressing placement  PRE-OPERATIVE DIAGNOSIS: Nonviable tissue and open wound right foot  POST-OPERATIVE DIAGNOSIS: Same as above  SURGEON: Leotis Pain, MD  ASSISTANT(S): none  ANESTHESIA: general  ESTIMATED BLOOD LOSS: minimal  FINDING(S): None  SPECIMEN(S):  none  INDICATIONS:   [name] is a 58 y.o. male who presents with an open wound of the right foot after digital amputations with skin separation.  We are going to clean up the wound and try to get a synthetic skin graft to get closure.  Risks and benefits were discussed and informed consent was obtained.  DESCRIPTION: After obtaining full informed written consent, the patient was brought back to the operating room and placed supine upon the operating table.  The patient received IV antibiotics prior to induction.  After obtaining adequate anesthesia, the patient was prepped and draped in the standard fashion.  The wound was then opened and excisional debridement was performed to the skin and soft tissue of the wound to the fibrinous exudate to remove all clearly non-viable tissue.  The tissue was taken back to bleeding tissue that appeared viable.  The debridement was performed with Metzenbaum scissors and encompassed an area of approximately 20 cm2.  The wound was irrigated copiously with sterile saline.  After all clearly non-viable tissue was removed, I then used two small to medium Bio-Connect wound matrix skin grafts to cover the wound.  These were stapled down and then an Adaptic was stapled down over that.  Over the adaptic, a negative pressure dressing was placed with strips of Ioban to create an occlusive seal. The patient was then awakened from anesthesia and taken to the recovery room in stable condition having tolerated the procedure  well.  COMPLICATIONS: none  CONDITION: stable  [name],[name]  03/01/2015, 1:55 PM

## 2015-03-01 NOTE — Discharge Instructions (Signed)
AMBULATORY SURGERY  °DISCHARGE INSTRUCTIONS ° ° °1) The drugs that you were given will stay in your system until tomorrow so for the next 24 hours you should not: ° °A) Drive an automobile °B) Make any legal decisions °C) Drink any alcoholic beverage ° ° °2) You may resume regular meals tomorrow.  Today it is better to start with liquids and gradually work up to solid foods. ° °You may eat anything you prefer, but it is better to start with liquids, then soup and crackers, and gradually work up to solid foods. ° ° °3) Please notify your doctor immediately if you have any unusual bleeding, trouble breathing, redness and pain at the surgery site, drainage, fever, or pain not relieved by medication. ° ° ° °4) Additional Instructions: ° ° ° ° ° ° ° °Please contact your physician with any problems or Same Day Surgery at [phone], Monday through Friday 6 am to 4 pm, or [facility] Main number at [phone].AMBULATORY SURGERY  °DISCHARGE INSTRUCTIONS ° ° °5) The drugs that you were given will stay in your system until tomorrow so for the next 24 hours you should not: ° °D) Drive an automobile °E) Make any legal decisions °F) Drink any alcoholic beverage ° ° °6) You may resume regular meals tomorrow.  Today it is better to start with liquids and gradually work up to solid foods. ° °You may eat anything you prefer, but it is better to start with liquids, then soup and crackers, and gradually work up to solid foods. ° ° °7) Please notify your doctor immediately if you have any unusual bleeding, trouble breathing, redness and pain at the surgery site, drainage, fever, or pain not relieved by medication. ° ° ° °8) Additional Instructions: ° ° ° ° ° ° ° °Please contact your physician with any problems or Same Day Surgery at [phone], Monday through Friday 6 am to 4 pm, or [place] [facility] [place] Main number at [phone]. °

## 2015-03-01 NOTE — Anesthesia Procedure Notes (Signed)
Procedure Name: LMA Insertion Performed by: Nelda Marseille Pre-anesthesia Checklist: Patient identified, Patient being monitored, Timeout performed, Emergency Drugs available and Suction available Patient Re-evaluated:Patient Re-evaluated prior to inductionOxygen Delivery Method: Circle system utilized Preoxygenation: Pre-oxygenation with 100% oxygen Intubation Type: IV induction Ventilation: Mask ventilation without difficulty LMA: LMA inserted LMA Size: 4.5 Tube type: Oral Number of attempts: 1 Placement Confirmation: positive ETCO2 and breath sounds checked- equal and bilateral Tube secured with: Tape Dental Injury: Teeth and Oropharynx as per pre-operative assessment

## 2015-03-02 ENCOUNTER — Encounter: Payer: Self-pay | Admitting: Vascular Surgery

## 2015-03-02 LAB — TYPE AND SCREEN
ABO/RH(D): B POS
ANTIBODY SCREEN: NEGATIVE

## 2015-04-25 ENCOUNTER — Other Ambulatory Visit: Payer: Self-pay | Admitting: Vascular Surgery

## 2015-04-26 ENCOUNTER — Inpatient Hospital Stay: Admission: RE | Admit: 2015-04-26 | Payer: Medicare Other | Source: Ambulatory Visit

## 2015-04-27 ENCOUNTER — Encounter
Admission: RE | Admit: 2015-04-27 | Discharge: 2015-04-27 | Disposition: A | Discharge status: Home / Self Care | Payer: Medicare Other | Source: Ambulatory Visit | Attending: Vascular Surgery | Admitting: Vascular Surgery

## 2015-04-27 DIAGNOSIS — Z01812 Encounter for preprocedural laboratory examination: Secondary | ICD-10-CM | POA: Insufficient documentation

## 2015-04-27 DIAGNOSIS — Z01810 Encounter for preprocedural cardiovascular examination: Secondary | ICD-10-CM | POA: Insufficient documentation

## 2015-04-27 HISTORY — DX: Sleep apnea, unspecified: G47.30

## 2015-04-27 LAB — CBC WITH DIFFERENTIAL/PLATELET
BASOS ABS: 0.1 10*3/uL (ref 0–0.1)
Basophils Relative: 1 %
EOS PCT: 4 %
Eosinophils Absolute: 0.3 10*3/uL (ref 0–0.7)
HEMATOCRIT: 34.3 % — AB (ref 40.0–52.0)
Hemoglobin: 11.1 g/dL — ABNORMAL LOW (ref 13.0–18.0)
LYMPHS ABS: 1.4 10*3/uL (ref 1.0–3.6)
LYMPHS PCT: 20 %
MCH: 30.5 pg (ref 26.0–34.0)
MCHC: 32.4 g/dL (ref 32.0–36.0)
MCV: 94.1 fL (ref 80.0–100.0)
MONO ABS: 0.9 10*3/uL (ref 0.2–1.0)
Monocytes Relative: 14 %
NEUTROS ABS: 4.2 10*3/uL (ref 1.4–6.5)
Neutrophils Relative %: 61 %
Platelets: 158 10*3/uL (ref 150–440)
RBC: 3.64 MIL/uL — AB (ref 4.40–5.90)
RDW: 15.1 % — AB (ref 11.5–14.5)
WBC: 6.9 10*3/uL (ref 3.8–10.6)

## 2015-04-27 LAB — BASIC METABOLIC PANEL
ANION GAP: 11 (ref 5–15)
BUN: 63 mg/dL — AB (ref 6–20)
CHLORIDE: 100 mmol/L — AB (ref 101–111)
CO2: 26 mmol/L (ref 22–32)
Calcium: 9.4 mg/dL (ref 8.9–10.3)
Creatinine, Ser: 8.15 mg/dL — ABNORMAL HIGH (ref 0.61–1.24)
GFR calc Af Amer: 7 mL/min — ABNORMAL LOW (ref >60)
GFR, EST NON AFRICAN AMERICAN: 6 mL/min — AB (ref >60)
GLUCOSE: 88 mg/dL (ref 65–99)
POTASSIUM: 5 mmol/L (ref 3.5–5.1)
Sodium: 137 mmol/L (ref 135–145)

## 2015-04-27 LAB — TYPE AND SCREEN
ABO/RH(D): B POS
ANTIBODY SCREEN: NEGATIVE

## 2015-04-27 LAB — APTT: APTT: 32 s (ref 24–36)

## 2015-04-27 LAB — PROTIME-INR
INR: 1.18
Prothrombin Time: 15.2 seconds — ABNORMAL HIGH (ref 11.4–15.0)

## 2015-04-27 LAB — SURGICAL PCR SCREEN
MRSA, PCR: POSITIVE — AB
STAPHYLOCOCCUS AUREUS: POSITIVE — AB

## 2015-04-27 NOTE — Pre-Procedure Instructions (Signed)
Postive MRSA Dr Bunnie Domino office notified.

## 2015-04-27 NOTE — Patient Instructions (Signed)
  Your procedure is scheduled on: 12 8/16 Thurs  Report to Day Surgery.2nd floor medical mall To find out your arrival time please call [phone] between 1PM - 3PM on 05/03/15 Wed  Remember: Instructions that are not followed completely may result in serious medical risk, up to and including death, or upon the discretion of your surgeon and anesthesiologist your surgery may need to be rescheduled.    __x__ 1. Do not eat food or drink liquids after midnight. No gum chewing or hard candies.     ____ 2. No Alcohol for 24 hours before or after surgery.   ____ 3. Bring all medications with you on the day of surgery if instructed.    __x__ 4. Notify your doctor if there is any change in your medical condition     (cold, fever, infections).     Do not wear jewelry, make-up, hairpins, clips or nail polish.  Do not wear lotions, powders, or perfumes. You may wear deodorant.  Do not shave 48 hours prior to surgery. Men may shave face and neck.  Do not bring valuables to the hospital.    [facility] is not responsible for any belongings or valuables.               Contacts, dentures or bridgework may not be worn into surgery.  Leave your suitcase in the car. After surgery it may be brought to your room.  For patients admitted to the hospital, discharge time is determined by your                treatment team.   Patients discharged the day of surgery will not be allowed to drive home.   Please read over the following fact sheets that you were given:   MRSA   _x___ Take these medicines the morning of surgery with A SIP OF WATER:    1. budesonide-formoterol (SYMBICORT) 160-4.5 MCG/ACT inhaler   2. metoprolol tartrate (LOPRESSOR) 25 MG tablet  3. oxyCODONE (OXY IR/ROXICODONE) 5 MG immediate release tablet if needed  4.pantoprazole (PROTONIX) 20 MG tablet  5.tiotropium (SPIRIVA) 18 MCG inhalation capsule  6.  ____ Fleet Enema (as directed)   _x___ Use CHG Soap as directed  ____ Use  inhalers on the day of surgery  ____ Stop metformin 2 days prior to surgery    ____ Take 1/2 of usual insulin dose the night before surgery and none on the morning of surgery.   ____ Stop Coumadin/Plavix/aspirin on stop   x____ Stop Anti-inflammatories 1 week before surgery may take Tylenol   ____ Stop supplements until after surgery.    ____ Bring C-Pap to the hospital.

## 2015-04-28 NOTE — Pre-Procedure Instructions (Signed)
EKG seen by Dr Amie Critchley, requested cardiac clearance.  Called Dr Bunnie Domino office and faxed clearance request form to Dr Bunnie Domino office.

## 2015-05-02 ENCOUNTER — Ambulatory Visit (INDEPENDENT_AMBULATORY_CARE_PROVIDER_SITE_OTHER): Payer: Medicare Other | Admitting: Cardiovascular Disease

## 2015-05-02 ENCOUNTER — Encounter: Payer: Self-pay | Admitting: Cardiovascular Disease

## 2015-05-02 VITALS — BP 147/81 | HR 62 | Ht 74.5 in | Wt 177.0 lb

## 2015-05-02 DIAGNOSIS — I2510 Atherosclerotic heart disease of native coronary artery without angina pectoris: Secondary | ICD-10-CM

## 2015-05-02 DIAGNOSIS — Z01818 Encounter for other preprocedural examination: Secondary | ICD-10-CM

## 2015-05-02 DIAGNOSIS — Z01810 Encounter for preprocedural cardiovascular examination: Secondary | ICD-10-CM

## 2015-05-02 DIAGNOSIS — I10 Essential (primary) hypertension: Secondary | ICD-10-CM | POA: Diagnosis not present

## 2015-05-02 DIAGNOSIS — I25110 Atherosclerotic heart disease of native coronary artery with unstable angina pectoris: Secondary | ICD-10-CM | POA: Diagnosis not present

## 2015-05-02 DIAGNOSIS — I5022 Chronic systolic (congestive) heart failure: Secondary | ICD-10-CM

## 2015-05-02 MED ORDER — NICOTINE 14 MG/24HR TD PT24: 14.0000 mg | MEDICATED_PATCH | Freq: Every day | TRANSDERMAL | Status: DC

## 2015-05-02 NOTE — Assessment & Plan Note (Signed)
The patient has extensive medical problems including chronic systolic heart failure with an ejection fraction of 20%, coronary artery disease with previous PCI in 2011 and severe pulmonary hypertension. All of these conditions obviously increase cardiovascular risk during surgery. Nonetheless, he has not had any recent presentations with acute coronary syndrome or hospitalizations for heart failure. Thus, he has been somewhat stable on medical therapy. Stress testing is not helpful in this situation given that we already know that his EF is severely reduced. The patient is considered at moderate risk for surgery from a cardiac standpoint. He did have previous surgeries last year without cardiovascular complications which is somewhat reassuring. Treatment with metoprolol should be continued at the total  time of general anesthesia should be reduced as much as possible.

## 2015-05-02 NOTE — Assessment & Plan Note (Signed)
No clear signs of fluid overload. He is currently on metoprolol. Consider switching to carvedilol and adding an ACE inhibitor or ARB if no contraindications.  I instructed him to follow-up with Dr. Nehemiah Massed his primary cardiologist.

## 2015-05-02 NOTE — Patient Instructions (Addendum)
Medication Instructions:  Please start nicotine patch 14 mg, apply 1 patch to skin once daily  Labwork: None  Testing/Procedures: None  Follow-Up: Call or return to clinic prn if these symptoms worsen or fail to improve as anticipated.  If you need a refill on your cardiac medications before your next appointment, please call your pharmacy.  Nicotine skin patches What is this medicine? NICOTINE ([place]) helps people stop smoking. The patches replace the nicotine found in cigarettes and help to decrease withdrawal effects. They are most effective when used in combination with a stop-smoking program. This medicine may be used for other purposes; ask your health care provider or pharmacist if you have questions. What should I tell my health care provider before I take this medicine? They need to know if you have any of these conditions: -diabetes -heart disease, angina, irregular heartbeat or previous heart attack -high blood pressure -lung disease, including asthma -overactive thyroid -pheochromocytoma -seizures or a history of seizures -skin problems, like eczema -stomach problems or ulcers -an unusual or allergic reaction to nicotine, adhesives, other medicines, foods, dyes, or preservatives -pregnant or trying to get pregnant -breast-feeding How should I use this medicine? This medicine is for use on the skin. Follow the directions that come with the patches. Find an area of skin on your upper arm, chest, or back that is clean, dry, greaseless, undamaged and hairless. Wash hands with plain soap and water. Do not use anything that contains aloe, lanolin or glycerin as these may prevent the patch from sticking. Dry thoroughly. Remove the patch from the sealed pouch. Do not try to cut or trim the patch. Using your palm, press the patch firmly in place for 10 seconds to make sure that there is good contact with your skin. After applying the patch, wash your hands. Change the patch  every day, keeping to a regular schedule. When you apply a new patch, use a new area of skin. Wait at least 1 week before using the same area again. Talk to your pediatrician regarding the use of this medicine in children. Special care may be needed. Overdosage: If you think you have taken too much of this medicine contact a poison control center or emergency room at once. NOTE: This medicine is only for you. Do not share this medicine with others. What if I miss a dose? If you forget to replace a patch, use it as soon as you can. Only use one patch at a time and do not leave on the skin for longer than directed. If a patch falls off, you can replace it, but keep to your schedule and remove the patch at the right time. What may interact with this medicine? -medicines for asthma -medicines for blood pressure -medicines for mental depression This list may not describe all possible interactions. Give your health care provider a list of all the medicines, herbs, non-prescription drugs, or dietary supplements you use. Also tell them if you smoke, drink alcohol, or use illegal drugs. Some items may interact with your medicine. What should I watch for while using this medicine? You should begin using the nicotine patch the day you stop smoking. It is okay if you do not succeed at your attempt to quit and have a cigarette. You can still continue your quit attempt and keep using the product as directed. Just throw away your cigarettes and get back to your quit plan. You can keep the patch in place during swimming, bathing, and showering. If  your patch falls off during these activities, replace it. When you first apply the patch, your skin may itch or burn. This should go away soon. When you remove a patch, the skin may look red, but this should only last for a few days. Call your doctor or health care professional if skin redness does not go away after 4 days, if your skin swells, or if you get a rash. If you  are a diabetic and you quit smoking, the effects of insulin may be increased and you may need to reduce your insulin dose. Check with your doctor or health care professional about how you should adjust your insulin dose. If you are going to have a magnetic resonance imaging (MRI) procedure, tell your MRI technician if you have this patch on your body. It must be removed before a MRI. What side effects may I notice from receiving this medicine? Side effects that you should report to your doctor or health care professional as soon as possible: -allergic reactions like skin rash, itching or hives, swelling of the face, lips, or tongue -breathing problems -changes in hearing -changes in vision -chest pain -cold sweats -confusion -fast, irregular heartbeat -feeling faint or lightheaded, falls -headache -increased saliva -skin redness that lasts more than 4 days -stomach pain -signs and symptoms of nicotine overdose like nausea; vomiting; dizziness; weakness; and rapid heartbeat Side effects that usually do not require medical attention (report to your doctor or health care professional if they continue or are bothersome): -diarrhea -dry mouth -hiccups -irritability -nervousness or restlessness -trouble sleeping or vivid dreams This list may not describe all possible side effects. Call your doctor for medical advice about side effects. You may report side effects to FDA at 1-800-FDA-1088. Where should I keep my medicine? Keep out of the reach of children. Store at room temperature between 20 and 25 degrees C (68 and 77 degrees F). Protect from heat and light. Store in International aid/development worker until ready to use. Throw away unused medicine after the expiration date. When you remove a patch, fold with sticky sides together; put in an empty opened pouch and throw away. NOTE: This sheet is a summary. It may not cover all possible information. If you have questions about this medicine, talk to your  doctor, pharmacist, or health care provider.    2016, Elsevier/Gold Standard. (2014-04-11 15:46:21) Steps to Quit Smoking  Smoking tobacco can be harmful to your health and can affect almost every organ in your body. Smoking puts you, and those around you, at risk for developing many serious chronic diseases. Quitting smoking is difficult, but it is one of the best things that you can do for your health. It is never too late to quit. WHAT ARE THE BENEFITS OF QUITTING SMOKING? When you quit smoking, you lower your risk of developing serious diseases and conditions, such as:  Lung cancer or lung disease, such as COPD.  Heart disease.  Stroke.  Heart attack.  Infertility.  Osteoporosis and bone fractures. Additionally, symptoms such as coughing, wheezing, and shortness of breath may get better when you quit. You may also find that you get sick less often because your body is stronger at fighting off colds and infections. If you are pregnant, quitting smoking can help to reduce your chances of having a baby of low birth weight. HOW DO I GET READY TO QUIT? When you decide to quit smoking, create a plan to make sure that you are successful. Before you quit:  Pick a  date to quit. Set a date within the next two weeks to give you time to prepare.  Write down the reasons why you are quitting. Keep this list in places where you will see it often, such as on your bathroom mirror or in your car or wallet.  Identify the people, places, things, and activities that make you want to smoke (triggers) and avoid them. Make sure to take these actions:  Throw away all cigarettes at home, at work, and in your car.  Throw away smoking accessories, such as Scientist, research (medical).  Clean your car and make sure to empty the ashtray.  Clean your home, including curtains and carpets.  Tell your family, friends, and coworkers that you are quitting. Support from your loved ones can make quitting  easier.  Talk with your health care provider about your options for quitting smoking.  Find out what treatment options are covered by your health insurance. WHAT STRATEGIES CAN I USE TO QUIT SMOKING?  Talk with your healthcare provider about different strategies to quit smoking. Some strategies include:  Quitting smoking altogether instead of gradually lessening how much you smoke over a period of time. Research shows that quitting "cold [place]" is more successful than gradually quitting.  Attending in-person counseling to help you build problem-solving skills. You are more likely to have success in quitting if you attend several counseling sessions. Even short sessions of 10 minutes can be effective.  Finding resources and support systems that can help you to quit smoking and remain smoke-free after you quit. These resources are most helpful when you use them often. They can include:  Online chats with a Social worker.  Telephone quitlines.  Printed Furniture conservator/restorer.  Support groups or group counseling.  Text messaging programs.  Mobile phone applications.  Taking medicines to help you quit smoking. (If you are pregnant or breastfeeding, talk with your health care provider first.) Some medicines contain nicotine and some do not. Both types of medicines help with cravings, but the medicines that include nicotine help to relieve withdrawal symptoms. Your health care provider may recommend:  Nicotine patches, gum, or lozenges.  Nicotine inhalers or sprays.  Non-nicotine medicine that is taken by mouth. Talk with your health care provider about combining strategies, such as taking medicines while you are also receiving in-person counseling. Using these two strategies together makes you more likely to succeed in quitting than if you used either strategy on its own. If you are pregnant or breastfeeding, talk with your health care provider about finding counseling or other support  strategies to quit smoking. Do not take medicine to help you quit smoking unless told to do so by your health care provider. WHAT THINGS CAN I DO TO MAKE IT EASIER TO QUIT? Quitting smoking might feel overwhelming at first, but there is a lot that you can do to make it easier. Take these important actions:  Reach out to your family and friends and ask that they support and encourage you during this time. Call telephone quitlines, reach out to support groups, or work with a counselor for support.  Ask people who smoke to avoid smoking around you.  Avoid places that trigger you to smoke, such as bars, parties, or smoke-break areas at work.  Spend time around people who do not smoke.  Lessen stress in your life, because stress can be a smoking trigger for some people. To lessen stress, try:  Exercising regularly.  Deep-breathing exercises.  Yoga.  Meditating.  Performing a  body scan. This involves closing your eyes, scanning your body from head to toe, and noticing which parts of your body are particularly tense. Purposefully relax the muscles in those areas.  Download or purchase mobile phone or tablet apps (applications) that can help you stick to your quit plan by providing reminders, tips, and encouragement. There are many free apps, such as QuitGuide from the [employer] Office manager for Disease Control and Prevention). You can find other support for quitting smoking (smoking cessation) through smokefree.gov and other websites. HOW WILL I FEEL WHEN I QUIT SMOKING? Within the first 24 hours of quitting smoking, you may start to feel some withdrawal symptoms. These symptoms are usually most noticeable 2-3 days after quitting, but they usually do not last beyond 2-3 weeks. Changes or symptoms that you might experience include:  Mood swings.  Restlessness, anxiety, or irritation.  Difficulty concentrating.  Dizziness.  Strong cravings for sugary foods in addition to nicotine.  Mild weight  gain.  Constipation.  Nausea.  Coughing or a sore throat.  Changes in how your medicines work in your body.  A depressed mood.  Difficulty sleeping (insomnia). After the first 2-3 weeks of quitting, you may start to notice more positive results, such as:  Improved sense of smell and taste.  Decreased coughing and sore throat.  Slower heart rate.  Lower blood pressure.  Clearer skin.  The ability to breathe more easily.  Fewer sick days. Quitting smoking is very challenging for most people. Do not get discouraged if you are not successful the first time. Some people need to make many attempts to quit before they achieve long-term success. Do your best to stick to your quit plan, and talk with your health care provider if you have any questions or concerns.   This information is not intended to replace advice given to you by your health care provider. Make sure you discuss any questions you have with your health care provider.   Document Released: 05/07/2001 Document Revised: 09/27/2014 Document Reviewed: 09/27/2014 Elsevier Interactive Patient Education 2016 [employer]. Smoking Hazards Smoking cigarettes is extremely bad for your health. Tobacco smoke has over 200 known poisons in it. It contains the poisonous gases nitrogen oxide and carbon monoxide. There are over 60 chemicals in tobacco smoke that cause cancer. Some of the chemicals found in cigarette smoke include:   Cyanide.   Benzene.   Formaldehyde.   Methanol (wood alcohol).   Acetylene (fuel used in welding torches).   Ammonia.  Even smoking lightly shortens your life expectancy by several years. You can greatly reduce the risk of medical problems for you and your family by stopping now. Smoking is the most preventable cause of death and disease in our society. Within days of quitting smoking, your circulation improves, you decrease the risk of having a heart attack, and your lung capacity improves.  There may be some increased phlegm in the first few days after quitting, and it may take months for your lungs to clear up completely. Quitting for 10 years reduces your risk of developing lung cancer to almost that of a nonsmoker.  WHAT ARE THE RISKS OF SMOKING? Cigarette smokers have an increased risk of many serious medical problems, including:  Lung cancer.   Lung disease (such as pneumonia, bronchitis, and emphysema).   Heart attack and chest pain due to the heart not getting enough oxygen (angina).   Heart disease and peripheral blood vessel disease.   Hypertension.   Stroke.   Oral cancer (  cancer of the lip, mouth, or voice box).   Bladder cancer.   Pancreatic cancer.   Cervical cancer.   Pregnancy complications, including premature birth.   Stillbirths and smaller newborn babies, birth defects, and genetic damage to sperm.   Early menopause.   Lower estrogen level for women.   Infertility.   Facial wrinkles.   Blindness.   Increased risk of broken bones (fractures).   Senile dementia.   Stomach ulcers and internal bleeding.   Delayed wound healing and increased risk of complications during surgery. Because of secondhand smoke exposure, children of smokers have an increased risk of the following:   Sudden infant death syndrome (SIDS).   Respiratory infections.   Lung cancer.   Heart disease.   Ear infections.  WHY IS SMOKING ADDICTIVE? Nicotine is the chemical agent in tobacco that is capable of causing addiction or dependence. When you smoke and inhale, nicotine is absorbed rapidly into the bloodstream through your lungs. Both inhaled and noninhaled nicotine may be addictive.  WHAT ARE THE BENEFITS OF QUITTING?  There are many health benefits to quitting smoking. Some are:   The likelihood of developing cancer and heart disease decreases. Health improvements are seen almost immediately.   Blood pressure, pulse rate, and  breathing patterns start returning to normal soon after quitting.   People who quit may see an improvement in their overall quality of life.  HOW DO YOU QUIT SMOKING? Smoking is an addiction with both physical and psychological effects, and longtime habits can be hard to change. Your health care provider can recommend:  Programs and community resources, which may include group support, education, or therapy.  Replacement products, such as patches, gum, and nasal sprays. Use these products only as directed. Do not replace cigarette smoking with electronic cigarettes (commonly called e-cigarettes). The safety of e-cigarettes is unknown, and some may contain harmful chemicals. FOR MORE INFORMATION  American Lung Association: www.lung.org  American Cancer Society: www.cancer.org   This information is not intended to replace advice given to you by your health care provider. Make sure you discuss any questions you have with your health care provider.   Document Released: 06/20/2004 Document Revised: 03/03/2013 Document Reviewed: 11/02/2012 Elsevier Interactive Patient Education [employer].

## 2015-05-02 NOTE — Progress Notes (Signed)
HPI  This is a 59 year old African-American male who was referred for preoperative cardiovascular evaluation before transmetatarsal amputation on the right side to be done by Dr. Lucky Cowboy.  The patient has extensive cardiac history and actually his cardiologist is Dr. Nehemiah Massed. The patient follows up with him on a regular basis. This was not clear cause when the appointment was scheduled. I offered the patient to schedule an appointment for him with Dr. Nehemiah Massed. However, the patient is scheduled for surgery this week and he needed the appointment today. He is going to continue his regular follow-up with Dr. Nehemiah Massed The patient has known history of chronic systolic heart failure with an ejection fraction of 20%, coronary artery disease status post PCI in 2011, and decision of disease on hemodialysis, hypertension, diabetes, peripheral arterial disease with previous left below the knee amputation, tobacco use and severe pulmonary hypertension. He denies any alcohol use. He smokes one third of a pack per day. He denies any chest pain. He has chronic exertional dyspnea with no recent worsening. He had no recent myocardial infarction or hospitalization for heart failure. He did have left below the knee amputation last year without complications with anesthesia. His functional capacity is reduced due to comorbidities and limited mobility related to amputation.  Allergies  Allergen Reactions  . Dust Mite Extract      Current Outpatient Prescriptions on File Prior to Visit  Medication Sig Dispense Refill  . acetaminophen (TYLENOL) 500 MG tablet Take 1,000 mg by mouth every 6 (six) hours as needed for moderate pain.    [name] albuterol (PROVENTIL HFA;VENTOLIN HFA) 108 (90 BASE) MCG/ACT inhaler Inhale 1 puff into the lungs every 6 (six) hours as needed for wheezing or shortness of breath.    [name] aspirin 325 MG tablet Take 325 mg by mouth daily.     . budesonide-formoterol (SYMBICORT) 160-4.5 MCG/ACT inhaler Inhale  2 puffs into the lungs 2 (two) times daily.    . Darbepoetin Alfa (ARANESP) 40 MCG/0.4ML SOSY injection Inject 0.4 mLs (40 mcg total) into the vein every Monday with hemodialysis. 8.4 mL   . docusate sodium (COLACE) 100 MG capsule Take 100 mg by mouth 2 (two) times daily.    [name] doxercalciferol (HECTOROL) 4 MCG/2ML injection Inject 1.25 mLs (2.5 mcg total) into the vein every Monday, Wednesday, and Friday with hemodialysis. 2 mL   . ferric gluconate 125 mg in sodium chloride 0.9 % 100 mL Inject 125 mg into the vein every Monday, Wednesday, and Friday with hemodialysis.    [name] gabapentin (NEURONTIN) 300 MG capsule Take 1 capsule by mouth at bedtime.    [name] ibuprofen (ADVIL,MOTRIN) 600 MG tablet Take 600 mg by mouth every 6 (six) hours as needed.    . metoprolol tartrate (LOPRESSOR) 25 MG tablet Take 25 mg by mouth 2 (two) times daily.      . midodrine (PROAMATINE) 5 MG tablet Take 5 mg by mouth. 3 times a week, 30 minutes before dialysis    . multivitamin (RENA-VIT) TABS tablet Take 1 tablet by mouth at bedtime.  0  . Nutritional Supplements (FEEDING SUPPLEMENT, NEPRO CARB STEADY,) LIQD Take 237 mLs by mouth daily.    [name] oxyCODONE (OXY IR/ROXICODONE) 5 MG immediate release tablet Take 1-2 tablets (5-10 mg total) by mouth every 4 (four) hours as needed for moderate pain. 30 tablet 0  . oxyCODONE-acetaminophen (PERCOCET) 7.5-325 MG tablet Take 1 tablet by mouth every 4 (four) hours as needed for severe pain. 30 tablet 0  .  sevelamer carbonate (RENVELA) 800 MG tablet Take 800 mg by mouth 3 (three) times daily with meals.    . simvastatin (ZOCOR) 10 MG tablet Take 1 tablet by mouth every evening.    . tiotropium (SPIRIVA) 18 MCG inhalation capsule Place 18 mcg into inhaler and inhale daily.    [name] triamcinolone cream (KENALOG) 0.1 % Apply 1 application topically daily as needed.     No current facility-administered medications on file prior to visit.     Past Medical History  Diagnosis Date  . End stage  renal disease ([place])   . HTN (hypertension)   . HLD (hyperlipidemia)   . Anemia   . Hyperparathyroidism   . Dialysis patient [facility])     Mon, Wed, Fri  . CHF (congestive heart failure) ([place])   . Asthma   . COPD (chronic obstructive pulmonary disease) ([place])   . Complication of anesthesia     hypotension  . Diabetes mellitus without complication ([place])   . GERD (gastroesophageal reflux disease)   . Shortness of breath dyspnea   . Peripheral vascular disease ([place])   . Sleep apnea   . Chronic systolic heart failure [facility])      Past Surgical History  Procedure Laterality Date  . Amputation Left 05/06/2014    Procedure: AMPUTATION BELOW KNEE;  Surgeon: Elam Dutch, MD;  Location: [facility] OR;  Service: Vascular;  Laterality: Left;  . Peripheral vascular catheterization Right 12/15/2014    Procedure: Lower Extremity Angiography;  Surgeon: Algernon Huxley, MD;  Location: [place] [facility] LAB;  Service: Cardiovascular;  Laterality: Right;  . Peripheral vascular catheterization  12/15/2014    Procedure: Lower Extremity Intervention;  Surgeon: Algernon Huxley, MD;  Location: [place] [facility];  Service: Cardiovascular;;  . Amputation Right 01/12/2015    Procedure: Foot transmetatarsal amputation;  Surgeon: Algernon Huxley, MD;  Location: [facility] ORS;  Service: Vascular;  Laterality: Right;  . Application of wound vac Right 03/01/2015    Procedure: Application of Bio-connekt graft and wound vac application to right foot ;  Surgeon: Algernon Huxley, MD;  Location: [facility] ORS;  Service: Vascular;  Laterality: Right;  . Av fistula placement Left   . Cardiac catheterization      stent placement      Family History  Problem Relation Age of Onset  . Heart failure Other   . Hypertension Other   . Leukemia Other   . Diabetes Other      Social History   Social History  . Marital Status: Widowed    Spouse Name: N/A  . Number of Children: N/A  . Years of Education: N/A   Occupational History  . Not on file.    Social History Main Topics  . Smoking status: Current Every Day Smoker -- 0.25 packs/day  . Smokeless tobacco: Former Systems developer    Quit date: 11/10/2014     Comment: 5  . Alcohol Use: No  . Drug Use: No     Comment: has used crack cocaine in past   . Sexual Activity: Not Currently    Birth Control/ Protection: Abstinence   Other Topics Concern  . Not on file   Social History Narrative   Engaged.      ROS A 10 point review of system was performed. It is negative other than that mentioned in the history of present illness.   PHYSICAL EXAM   BP 147/81 mmHg  Pulse 62  Ht 6' 2.5" (1.892 m)  [facility]  177 lb (80.287 kg)  BMI 22.43 kg/m2  Constitutional: He is oriented to person, place, and time. He appears well-developed and well-nourished. No distress.  HENT: No nasal discharge.  Head: Normocephalic and atraumatic.  Eyes: Pupils are equal and round.  No discharge. Neck: Normal range of motion. Neck supple. No JVD present. No thyromegaly present.  Cardiovascular: Normal rate, regular rhythm, normal heart sounds. Exam reveals no gallop and no friction rub. No murmur heard.  Pulmonary/Chest: Effort normal and breath sounds normal. No stridor. No respiratory distress. He has no wheezes. He has no rales. He exhibits no tenderness.  Abdominal: Soft. Bowel sounds are normal. He exhibits no distension. There is no tenderness. There is no rebound and no guarding.  Musculoskeletal: Left below the knee amputation. He exhibits no edema and no tenderness.  Neurological: He is alert and oriented to person, place, and time. Coordination normal.  Skin: Skin is warm and dry. No rash noted. He is not diaphoretic. No erythema. No pallor.  Psychiatric: He has a normal mood and affect. His behavior is normal. Judgment and thought content normal.      EKG: Normal sinus rhythm with left ventricular hypertrophy with QRS widening and repolarization abnormalities.   ASSESSMENT AND PLAN

## 2015-05-02 NOTE — Pre-Procedure Instructions (Signed)
Seeing Dr Rockey Situ today 05/02/15. Spoke with [employer]

## 2015-05-04 ENCOUNTER — Ambulatory Visit
Admission: RE | Admit: 2015-05-04 | Discharge: 2015-05-04 | Disposition: A | Discharge status: Skilled Nursing Facility | Payer: Medicare Other | Source: Ambulatory Visit | Attending: Vascular Surgery | Admitting: Vascular Surgery

## 2015-05-04 ENCOUNTER — Ambulatory Visit: Payer: Medicare Other | Admitting: Anesthesiology

## 2015-05-04 ENCOUNTER — Encounter: Payer: Self-pay | Admitting: Vascular Surgery

## 2015-05-04 ENCOUNTER — Encounter
Admission: RE | Disposition: A | Discharge status: Skilled Nursing Facility | Payer: Self-pay | Source: Ambulatory Visit | Attending: Vascular Surgery

## 2015-05-04 DIAGNOSIS — M869 Osteomyelitis, unspecified: Secondary | ICD-10-CM | POA: Insufficient documentation

## 2015-05-04 DIAGNOSIS — E1022 Type 1 diabetes mellitus with diabetic chronic kidney disease: Secondary | ICD-10-CM | POA: Insufficient documentation

## 2015-05-04 DIAGNOSIS — Z992 Dependence on renal dialysis: Secondary | ICD-10-CM | POA: Insufficient documentation

## 2015-05-04 DIAGNOSIS — I739 Peripheral vascular disease, unspecified: Secondary | ICD-10-CM | POA: Insufficient documentation

## 2015-05-04 DIAGNOSIS — Z7982 Long term (current) use of aspirin: Secondary | ICD-10-CM | POA: Insufficient documentation

## 2015-05-04 DIAGNOSIS — T8753 Necrosis of amputation stump, right lower extremity: Secondary | ICD-10-CM | POA: Insufficient documentation

## 2015-05-04 DIAGNOSIS — I70261 Atherosclerosis of native arteries of extremities with gangrene, right leg: Secondary | ICD-10-CM | POA: Insufficient documentation

## 2015-05-04 DIAGNOSIS — E7800 Pure hypercholesterolemia, unspecified: Secondary | ICD-10-CM | POA: Diagnosis not present

## 2015-05-04 DIAGNOSIS — F17200 Nicotine dependence, unspecified, uncomplicated: Secondary | ICD-10-CM | POA: Diagnosis not present

## 2015-05-04 DIAGNOSIS — I509 Heart failure, unspecified: Secondary | ICD-10-CM | POA: Insufficient documentation

## 2015-05-04 DIAGNOSIS — Z79899 Other long term (current) drug therapy: Secondary | ICD-10-CM | POA: Insufficient documentation

## 2015-05-04 DIAGNOSIS — L97514 Non-pressure chronic ulcer of other part of right foot with necrosis of bone: Secondary | ICD-10-CM | POA: Diagnosis not present

## 2015-05-04 DIAGNOSIS — Z7951 Long term (current) use of inhaled steroids: Secondary | ICD-10-CM | POA: Diagnosis not present

## 2015-05-04 DIAGNOSIS — N186 End stage renal disease: Secondary | ICD-10-CM | POA: Diagnosis not present

## 2015-05-04 DIAGNOSIS — I132 Hypertensive heart and chronic kidney disease with heart failure and with stage 5 chronic kidney disease, or end stage renal disease: Secondary | ICD-10-CM | POA: Insufficient documentation

## 2015-05-04 HISTORY — PX: TRANSMETATARSAL AMPUTATION: SHX6197

## 2015-05-04 LAB — GLUCOSE, CAPILLARY: Glucose-Capillary: 104 mg/dL — ABNORMAL HIGH (ref 65–99)

## 2015-05-04 LAB — POCT I-STAT 4, (NA,K, GLUC, HGB,HCT)
GLUCOSE: 86 mg/dL (ref 65–99)
HEMATOCRIT: 39 % (ref 39.0–52.0)
Hemoglobin: 13.3 g/dL (ref 13.0–17.0)
Potassium: 5 mmol/L (ref 3.5–5.1)
SODIUM: 134 mmol/L — AB (ref 135–145)

## 2015-05-04 SURGERY — AMPUTATION, FOOT, TRANSMETATARSAL
Anesthesia: General | Laterality: Right

## 2015-05-04 MED ORDER — CEFAZOLIN SODIUM-DEXTROSE 2-3 GM-% IV SOLR
INTRAVENOUS | Status: AC
  Filled 2015-05-04: qty 50

## 2015-05-04 MED ORDER — VANCOMYCIN HCL IN DEXTROSE 1-5 GM/200ML-% IV SOLN: 1000.0000 mg | Freq: Once | INTRAVENOUS | Status: DC

## 2015-05-04 MED ORDER — CEFAZOLIN SODIUM-DEXTROSE 2-3 GM-% IV SOLR
2.0000 g | INTRAVENOUS | Status: AC
  Administered 2015-05-04: 2 g via INTRAVENOUS

## 2015-05-04 MED ORDER — FENTANYL CITRATE (PF) 100 MCG/2ML IJ SOLN
INTRAMUSCULAR | Status: DC | PRN
  Administered 2015-05-04: 50 ug via INTRAVENOUS

## 2015-05-04 MED ORDER — INSULIN ASPART 100 UNIT/ML ~~LOC~~ SOLN: 0.0000 [IU] | SUBCUTANEOUS | Status: DC

## 2015-05-04 MED ORDER — PROPOFOL 10 MG/ML IV BOLUS
INTRAVENOUS | Status: DC | PRN
  Administered 2015-05-04: 150 mg via INTRAVENOUS

## 2015-05-04 MED ORDER — FENTANYL CITRATE (PF) 100 MCG/2ML IJ SOLN
25.0000 ug | INTRAMUSCULAR | Status: AC | PRN
  Administered 2015-05-04 (×6): 25 ug via INTRAVENOUS

## 2015-05-04 MED ORDER — FENTANYL CITRATE (PF) 100 MCG/2ML IJ SOLN
INTRAMUSCULAR | Status: AC
  Administered 2015-05-04: 25 ug via INTRAVENOUS
  Filled 2015-05-04: qty 2

## 2015-05-04 MED ORDER — SODIUM CHLORIDE 0.9 % IV SOLN
INTRAVENOUS | Status: DC
  Administered 2015-05-04: 12:00:00 via INTRAVENOUS

## 2015-05-04 MED ORDER — FENTANYL CITRATE (PF) 100 MCG/2ML IJ SOLN: INTRAMUSCULAR | Status: DC | PRN

## 2015-05-04 MED ORDER — ONDANSETRON HCL 4 MG/2ML IJ SOLN: 4.0000 mg | Freq: Once | INTRAMUSCULAR | Status: DC | PRN

## 2015-05-04 MED ORDER — OXYCODONE-ACETAMINOPHEN 7.5-325 MG PO TABS
ORAL_TABLET | ORAL | Status: AC
  Administered 2015-05-04: 15:00:00
  Filled 2015-05-04: qty 1

## 2015-05-04 MED ORDER — VANCOMYCIN HCL IN DEXTROSE 1-5 GM/200ML-% IV SOLN
INTRAVENOUS | Status: AC
  Administered 2015-05-04: 1000 mg via INTRAVENOUS
  Filled 2015-05-04: qty 200

## 2015-05-04 SURGICAL SUPPLY — 34 items
BANDAGE ELASTIC 6 LF NS (GAUZE/BANDAGES/DRESSINGS) ×3 IMPLANT
BLADE MED AGGRESSIVE (BLADE) ×3 IMPLANT
BLADE SAGITTAL WIDE XTHICK NO (BLADE) ×3 IMPLANT
BNDG COHESIVE 4X5 TAN STRL (GAUZE/BANDAGES/DRESSINGS) ×3 IMPLANT
BNDG GAUZE 4.5X4.1 6PLY STRL (MISCELLANEOUS) ×6 IMPLANT
BRUSH SCRUB 4% CHG (MISCELLANEOUS) ×3 IMPLANT
CANISTER SUCT 1200ML W/VALVE (MISCELLANEOUS) ×3 IMPLANT
CHLORAPREP W/TINT 26ML (MISCELLANEOUS) ×3 IMPLANT
ELECT CAUTERY BLADE 6.4 (BLADE) ×3 IMPLANT
GAUZE PETRO XEROFOAM 1X8 (MISCELLANEOUS) ×6 IMPLANT
GLOVE BIO SURGEON STRL SZ7 (GLOVE) ×3 IMPLANT
GOWN STRL REUS W/ TWL LRG LVL3 (GOWN DISPOSABLE) ×1 IMPLANT
GOWN STRL REUS W/ TWL XL LVL3 (GOWN DISPOSABLE) ×1 IMPLANT
GOWN STRL REUS W/TWL LRG LVL3 (GOWN DISPOSABLE) ×2
GOWN STRL REUS W/TWL XL LVL3 (GOWN DISPOSABLE) ×2
HANDLE YANKAUER SUCT BULB TIP (MISCELLANEOUS) ×3 IMPLANT
KIT RM TURNOVER STRD PROC AR (KITS) ×3 IMPLANT
LABEL OR SOLS (LABEL) ×3 IMPLANT
NS IRRIG 1000ML POUR BTL (IV SOLUTION) ×3 IMPLANT
PACK EXTREMITY ARMC (MISCELLANEOUS) ×3 IMPLANT
PAD ABD DERMACEA PRESS 5X9 (GAUZE/BANDAGES/DRESSINGS) ×6 IMPLANT
PAD GROUND ADULT SPLIT (MISCELLANEOUS) ×3 IMPLANT
PAD PREP 24X41 OB/GYN DISP (PERSONAL CARE ITEMS) ×3 IMPLANT
SPONGE LAP 18X18 5 PK (GAUZE/BANDAGES/DRESSINGS) ×3 IMPLANT
STAPLER SKIN PROX 35W (STAPLE) ×3 IMPLANT
STOCKINETTE M/LG 89821 (MISCELLANEOUS) ×3 IMPLANT
SUT ETHILON 2 0 FS 18 (SUTURE) ×3 IMPLANT
SUT SILK 2 0 (SUTURE) ×2
SUT SILK 2 0 SH (SUTURE) ×6 IMPLANT
SUT SILK 2-0 18XBRD TIE 12 (SUTURE) ×1 IMPLANT
SUT SILK 3 0 (SUTURE) ×2
SUT SILK 3-0 18XBRD TIE 12 (SUTURE) ×1 IMPLANT
SUT VIC AB 0 CT1 36 (SUTURE) ×6 IMPLANT
SUT VIC AB 2-0 CT1 (SUTURE) ×6 IMPLANT

## 2015-05-04 NOTE — Anesthesia Procedure Notes (Signed)
Procedure Name: LMA Insertion Date/Time: 05/04/2015 12:19 PM Performed by: Delaney Meigs Pre-anesthesia Checklist: Patient identified, Emergency Drugs available, Suction available, Patient being monitored and Timeout performed Patient Re-evaluated:Patient Re-evaluated prior to inductionOxygen Delivery Method: Circle system utilized and Simple face mask Preoxygenation: Pre-oxygenation with 100% oxygen Intubation Type: Inhalational induction Ventilation: Mask ventilation without difficulty LMA Size: 4.5 Number of attempts: 1 Placement Confirmation: breath sounds checked- equal and bilateral and positive ETCO2 Tube secured with: Tape

## 2015-05-04 NOTE — OR Nursing (Signed)
Dr Laureen Abrahams in, plan to wait to start IV in OR.  Then start Vancomycin.

## 2015-05-04 NOTE — Transfer of Care (Signed)
Immediate Anesthesia Transfer of Care Note  Patient: [name]  Procedure(s) Performed: Procedure(s): TRANSMETATARSAL AMPUTATION REVISION, great toe amputation (Right)  Patient Location: PACU  Anesthesia Type:General  Level of Consciousness: awake, alert  and oriented  Airway & Oxygen Therapy: Patient Spontanous Breathing and Patient connected to face mask oxygen  Post-op Assessment: Report given to RN and Post -op Vital signs reviewed and stable  Post vital signs: Reviewed and stable  Last Vitals: 13:39 68hr 100% sat 14 resp 113/77 bs: 104  Filed Vitals:   05/04/15 1112  BP: 132/85  Pulse: 68  Temp: 36.6 C  Resp: 20    Complications: No apparent anesthesia complications

## 2015-05-04 NOTE — Op Note (Signed)
[facility]   OPERATIVE NOTE  DATE: 05/04/2015  PRE-OPERATIVE DIAGNOSIS: 1. Nonhealing wound right foot status post amputation of toes 2 through 5 with gangrene of the remaining right great toe and a nonhealing wound at the base of the right great toe. 2. End stage renal disease. 3. Peripheral arterial disease status post revascularization  POST-OPERATIVE DIAGNOSIS: same as above  PROCEDURE: 1.   Completion right transmetatarsal amputation  SURGEON: Ayala Ribble  ASSISTANT(S): Hezzie Bump, PA-C  ANESTHESIA: general   ESTIMATED BLOOD LOSS: minimal  FINDING(S): 1.  none  SPECIMEN(S):  Right great toe, metatarsal, and resected tissue  INDICATIONS:   [name] is a 59 y.o. male who presents with a nonhealing wound of the right foot having already lost toes 2-5 previously.  His great toe is gangrenous and his wound will not heal.  He will have a completion TMA.  DESCRIPTION: After obtaining full informed written consent, the patient was brought back to the operating room and placed supine upon the operating table.  The patient received IV antibiotics prior to induction.  After obtaining adequate anesthesia, the patient was prepped and draped in the standard fashion for. An elliptical incision was made just around the previous incision and open wound. We dissected down and removed the right great toe in its entirety. This exposed the first metatarsal which was then dissected free using electrocautery and the periosteal elevator several centimeters more proximally to allow wound to be closed without pressure. I then used the TPS saw to transect the metatarsal head in its midportion. Several sesamoid bones were removed as well. Metatarsals 2 through 5 appeared to be healed for the most part with reasonable skin closure laterally. The wound was irrigated and all sites of bleeding were controlled with electrocautery. The deep tissue was then closed with 4 interrupted  0 Vicryls to close the fascia over the bones. I then closed the superficial tissue with a running 2-0 Vicryl.  The skin was closed with a series of interrupted mattress 2-0 nylon sutures. Xeroform and a sterile dressing were then placed. The patient was awakened from anesthesia and taken to the recovery room in stable condition having tolerated the procedure well.   COMPLICATIONS: None  CONDITION: Stable  [name]  05/04/2015, 1:24 PM

## 2015-05-04 NOTE — Anesthesia Postprocedure Evaluation (Signed)
Anesthesia Post Note  Patient: [name]  Procedure(s) Performed: Procedure(s) (LRB): TRANSMETATARSAL AMPUTATION REVISION, great toe amputation (Right)  Patient location during evaluation: PACU Anesthesia Type: General Level of consciousness: awake Pain management: pain level controlled Vital Signs Assessment: vitals unstable Respiratory status: respiratory function stable Cardiovascular status: stable Anesthetic complications: no    Last Vitals:  Filed Vitals:   05/04/15 1112 05/04/15 1335  BP: 132/85 113/77  Pulse: 68 66  Temp: 36.6 C 36.8 C  Resp: 20 9    Last Pain:  Filed Vitals:   05/04/15 1339  PainSc: 10-Worst pain ever                 VAN STAVEREN,Emet Rafanan

## 2015-05-04 NOTE — OR Nursing (Signed)
Attempted IV start in [facility] left arm.  Able to obtain sample for potassium and blood glucose, but unable to thread IV .  Pt refuses any more attempts, esp in hand.  Says that they always take him to the OR and give him gas and then start iv. Dr Laureen Abrahams aware,  Also aware of need to start vancomycin for MRSA

## 2015-05-04 NOTE — H&P (Signed)
[facility] History & Physical Update  The patient was interviewed and re-examined.  The patient's previous History and Physical has been reviewed and is unchanged.  There is no change in the plan of care. We plan to proceed with the scheduled procedure.  Aerith Canal, MD  05/04/2015, 11:48 AM

## 2015-05-04 NOTE — Anesthesia Preprocedure Evaluation (Signed)
Anesthesia Evaluation  Patient identified by MRN, date of birth, ID band Patient awake    Reviewed: Allergy & Precautions, NPO status , Patient's Chart, lab work & pertinent test results  Airway Mallampati: II       Dental  (+) Poor Dentition, Chipped, Edentulous Upper   Pulmonary sleep apnea , COPD,  COPD inhaler, Current Smoker,    + rhonchi        Cardiovascular hypertension, Pt. on medications and Pt. on home beta blockers + CAD and +CHF   Rhythm:Regular Rate:Normal     Neuro/Psych    GI/Hepatic GERD  ,  Endo/Other  diabetes, Type 1, Insulin Dependent  Renal/GU CRF and DialysisRenal disease     Musculoskeletal   Abdominal Normal abdominal exam  (+)   Peds  Hematology  (+) anemia ,   Anesthesia Other Findings   Reproductive/Obstetrics                             Anesthesia Physical Anesthesia Plan  ASA: III  Anesthesia Plan: General   Post-op Pain Management:    Induction: Intravenous  Airway Management Planned: LMA  Additional Equipment:   Intra-op Plan:   Post-operative Plan: Extubation in OR  Informed Consent: I have reviewed the patients History and Physical, chart, labs and discussed the procedure including the risks, benefits and alternatives for the proposed anesthesia with the patient or authorized representative who has indicated his/her understanding and acceptance.     Plan Discussed with: CRNA  Anesthesia Plan Comments:         Anesthesia Quick Evaluation

## 2015-05-04 NOTE — Progress Notes (Signed)
Called [facility] and spoke to nurse Mateo Flow    Report given and nurse states no questions

## 2015-05-04 NOTE — Discharge Instructions (Addendum)
AMBULATORY SURGERY  °DISCHARGE INSTRUCTIONS ° ° °1) The drugs that you were given will stay in your system until tomorrow so for the next 24 hours you should not: ° °A) Drive an automobile °B) Make any legal decisions °C) Drink any alcoholic beverage ° ° °2) You may resume regular meals tomorrow.  Today it is better to start with liquids and gradually work up to solid foods. ° °You may eat anything you prefer, but it is better to start with liquids, then soup and crackers, and gradually work up to solid foods. ° ° °3) Please notify your doctor immediately if you have any unusual bleeding, trouble breathing, redness and pain at the surgery site, drainage, fever, or pain not relieved by medication. ° ° ° °4) Additional Instructions: ° ° ° ° ° ° ° °Please contact your physician with any problems or Same Day Surgery at [phone], Monday through Friday 6 am to 4 pm, or [place] [facility] [place] Main number at [phone].AMBULATORY SURGERY  °DISCHARGE INSTRUCTIONS ° ° °5) The drugs that you were given will stay in your system until tomorrow so for the next 24 hours you should not: ° °D) Drive an automobile °E) Make any legal decisions °F) Drink any alcoholic beverage ° ° °6) You may resume regular meals tomorrow.  Today it is better to start with liquids and gradually work up to solid foods. ° °You may eat anything you prefer, but it is better to start with liquids, then soup and crackers, and gradually work up to solid foods. ° ° °7) Please notify your doctor immediately if you have any unusual bleeding, trouble breathing, redness and pain at the surgery site, drainage, fever, or pain not relieved by medication. ° ° ° °8) Additional Instructions: ° ° ° ° ° ° ° °Please contact your physician with any problems or Same Day Surgery at [phone], Monday through Friday 6 am to 4 pm, or [place] [facility] [place] Main number at [phone]. °

## 2015-05-08 LAB — SURGICAL PATHOLOGY

## 2015-08-08 ENCOUNTER — Other Ambulatory Visit: Payer: Self-pay | Admitting: Vascular Surgery

## 2015-08-10 ENCOUNTER — Ambulatory Visit
Admission: RE | Admit: 2015-08-10 | Discharge: 2015-08-10 | Disposition: A | Discharge status: Home / Self Care | Payer: Medicare Other | Source: Ambulatory Visit | Attending: Vascular Surgery | Admitting: Vascular Surgery

## 2015-08-10 MED ORDER — DEXTROSE 5 % IV SOLN: 1.5000 g | INTRAVENOUS | Status: DC

## 2015-08-10 MED ORDER — FAMOTIDINE 20 MG PO TABS: 40.0000 mg | ORAL_TABLET | ORAL | Status: DC | PRN

## 2015-08-10 MED ORDER — ONDANSETRON HCL 4 MG/2ML IJ SOLN: 4.0000 mg | Freq: Four times a day (QID) | INTRAMUSCULAR | Status: DC | PRN

## 2015-08-10 MED ORDER — METHYLPREDNISOLONE SODIUM SUCC 125 MG IJ SOLR: 125.0000 mg | INTRAMUSCULAR | Status: DC | PRN

## 2015-08-10 MED ORDER — HYDROMORPHONE HCL 1 MG/ML IJ SOLN: 1.0000 mg | Freq: Once | INTRAMUSCULAR | Status: DC

## 2015-08-14 ENCOUNTER — Encounter: Payer: Self-pay | Admitting: *Deleted

## 2015-08-14 ENCOUNTER — Other Ambulatory Visit: Payer: Self-pay | Admitting: Vascular Surgery

## 2015-08-14 ENCOUNTER — Ambulatory Visit
Admission: RE | Admit: 2015-08-14 | Discharge: 2015-08-14 | Disposition: A | Discharge status: Home / Self Care | Payer: Medicare Other | Source: Ambulatory Visit | Attending: Vascular Surgery | Admitting: Vascular Surgery

## 2015-08-14 ENCOUNTER — Encounter
Admission: RE | Disposition: A | Discharge status: Home / Self Care | Payer: Self-pay | Source: Ambulatory Visit | Attending: Vascular Surgery

## 2015-08-14 DIAGNOSIS — I509 Heart failure, unspecified: Secondary | ICD-10-CM | POA: Insufficient documentation

## 2015-08-14 DIAGNOSIS — Z992 Dependence on renal dialysis: Secondary | ICD-10-CM | POA: Diagnosis not present

## 2015-08-14 DIAGNOSIS — E7800 Pure hypercholesterolemia, unspecified: Secondary | ICD-10-CM | POA: Insufficient documentation

## 2015-08-14 DIAGNOSIS — I132 Hypertensive heart and chronic kidney disease with heart failure and with stage 5 chronic kidney disease, or end stage renal disease: Secondary | ICD-10-CM | POA: Diagnosis not present

## 2015-08-14 DIAGNOSIS — Z7982 Long term (current) use of aspirin: Secondary | ICD-10-CM | POA: Diagnosis not present

## 2015-08-14 DIAGNOSIS — T82858A Stenosis of vascular prosthetic devices, implants and grafts, initial encounter: Secondary | ICD-10-CM | POA: Diagnosis present

## 2015-08-14 DIAGNOSIS — I2510 Atherosclerotic heart disease of native coronary artery without angina pectoris: Secondary | ICD-10-CM | POA: Insufficient documentation

## 2015-08-14 DIAGNOSIS — Z79899 Other long term (current) drug therapy: Secondary | ICD-10-CM | POA: Insufficient documentation

## 2015-08-14 DIAGNOSIS — F17200 Nicotine dependence, unspecified, uncomplicated: Secondary | ICD-10-CM | POA: Insufficient documentation

## 2015-08-14 DIAGNOSIS — Z89519 Acquired absence of unspecified leg below knee: Secondary | ICD-10-CM | POA: Diagnosis not present

## 2015-08-14 DIAGNOSIS — N186 End stage renal disease: Secondary | ICD-10-CM | POA: Diagnosis not present

## 2015-08-14 DIAGNOSIS — Z89439 Acquired absence of unspecified foot: Secondary | ICD-10-CM | POA: Diagnosis not present

## 2015-08-14 DIAGNOSIS — Y841 Kidney dialysis as the cause of abnormal reaction of the patient, or of later complication, without mention of misadventure at the time of the procedure: Secondary | ICD-10-CM | POA: Diagnosis not present

## 2015-08-14 DIAGNOSIS — I739 Peripheral vascular disease, unspecified: Secondary | ICD-10-CM | POA: Insufficient documentation

## 2015-08-14 HISTORY — PX: PERIPHERAL VASCULAR CATHETERIZATION: SHX172C

## 2015-08-14 LAB — POTASSIUM (ARMC VASCULAR LAB ONLY): Potassium (ARMC vascular lab): 3.9 (ref 3.5–5.1)

## 2015-08-14 SURGERY — A/V SHUNTOGRAM/FISTULAGRAM
Anesthesia: Moderate Sedation | Laterality: Right

## 2015-08-14 MED ORDER — CEFUROXIME SODIUM 1.5 G IJ SOLR
INTRAMUSCULAR | Status: DC | PRN
  Administered 2015-08-14: 1.5 g via INTRAVENOUS

## 2015-08-14 MED ORDER — FENTANYL CITRATE (PF) 100 MCG/2ML IJ SOLN
INTRAMUSCULAR | Status: DC | PRN
  Administered 2015-08-14: 50 ug via INTRAVENOUS

## 2015-08-14 MED ORDER — OXYCODONE-ACETAMINOPHEN 5-325 MG PO TABS: 1.0000 | ORAL_TABLET | ORAL | Status: DC | PRN

## 2015-08-14 MED ORDER — MIDAZOLAM HCL 5 MG/5ML IJ SOLN
INTRAMUSCULAR | Status: AC
  Filled 2015-08-14: qty 5

## 2015-08-14 MED ORDER — ONDANSETRON HCL 4 MG/2ML IJ SOLN: 4.0000 mg | Freq: Four times a day (QID) | INTRAMUSCULAR | Status: DC | PRN

## 2015-08-14 MED ORDER — METOPROLOL TARTRATE 1 MG/ML IV SOLN: 2.0000 mg | INTRAVENOUS | Status: DC | PRN

## 2015-08-14 MED ORDER — ALUM & MAG HYDROXIDE-SIMETH 200-200-20 MG/5ML PO SUSP: 15.0000 mL | ORAL | Status: DC | PRN

## 2015-08-14 MED ORDER — HEPARIN SODIUM (PORCINE) 1000 UNIT/ML IJ SOLN
INTRAMUSCULAR | Status: AC
  Filled 2015-08-14: qty 1

## 2015-08-14 MED ORDER — FENTANYL CITRATE (PF) 100 MCG/2ML IJ SOLN
INTRAMUSCULAR | Status: AC
  Filled 2015-08-14: qty 2

## 2015-08-14 MED ORDER — GUAIFENESIN-DM 100-10 MG/5ML PO SYRP: 15.0000 mL | ORAL_SOLUTION | ORAL | Status: DC | PRN

## 2015-08-14 MED ORDER — HEPARIN SODIUM (PORCINE) 1000 UNIT/ML IJ SOLN
INTRAMUSCULAR | Status: DC | PRN
  Administered 2015-08-14: 3000 [IU] via INTRAVENOUS

## 2015-08-14 MED ORDER — HEPARIN (PORCINE) IN NACL 2-0.9 UNIT/ML-% IJ SOLN
INTRAMUSCULAR | Status: AC
  Filled 2015-08-14: qty 1000

## 2015-08-14 MED ORDER — MORPHINE SULFATE (PF) 4 MG/ML IV SOLN: 2.0000 mg | INTRAVENOUS | Status: DC | PRN

## 2015-08-14 MED ORDER — MIDAZOLAM HCL 2 MG/2ML IJ SOLN
INTRAMUSCULAR | Status: DC | PRN
  Administered 2015-08-14: 2 mg via INTRAVENOUS

## 2015-08-14 MED ORDER — ACETAMINOPHEN 325 MG PO TABS: 325.0000 mg | ORAL_TABLET | ORAL | Status: DC | PRN

## 2015-08-14 MED ORDER — IOHEXOL 300 MG/ML  SOLN
INTRAMUSCULAR | Status: DC | PRN
  Administered 2015-08-14: 35 mL via INTRAVENOUS

## 2015-08-14 MED ORDER — LABETALOL HCL 5 MG/ML IV SOLN: 10.0000 mg | INTRAVENOUS | Status: DC | PRN

## 2015-08-14 MED ORDER — LIDOCAINE-EPINEPHRINE (PF) 1 %-1:200000 IJ SOLN
INTRAMUSCULAR | Status: AC
  Filled 2015-08-14: qty 30

## 2015-08-14 MED ORDER — HYDRALAZINE HCL 20 MG/ML IJ SOLN: 5.0000 mg | INTRAMUSCULAR | Status: DC | PRN

## 2015-08-14 MED ORDER — PHENOL 1.4 % MT LIQD: 1.0000 | OROMUCOSAL | Status: DC | PRN

## 2015-08-14 MED ORDER — ACETAMINOPHEN 325 MG RE SUPP: 325.0000 mg | RECTAL | Status: DC | PRN

## 2015-08-14 MED ORDER — SODIUM CHLORIDE 0.9 % IV SOLN
INTRAVENOUS | Status: DC
  Administered 2015-08-14: 16:00:00 via INTRAVENOUS

## 2015-08-14 SURGICAL SUPPLY — 12 items
BALLN ARMADA 9X80X80 (BALLOONS) ×4 IMPLANT
BALLN ULTRVRSE 8X60X75C (BALLOONS) ×4
BALLOON ULTRVRSE 8X60X75C (BALLOONS) ×2 IMPLANT
CANNULA 5F STIFF (CANNULA) ×4 IMPLANT
DEVICE PRESTO INFLATION (MISCELLANEOUS) ×4 IMPLANT
DRAPE BRACHIAL (DRAPES) ×4 IMPLANT
PACK ANGIOGRAPHY (CUSTOM PROCEDURE TRAY) ×4 IMPLANT
SHEATH BRITE TIP 6FRX5.5 (SHEATH) ×4 IMPLANT
STENT VIABAHN 8X150X120 (Permanent Stent) ×2 IMPLANT
STENT VIABAHN 8X15X120 7FR (Permanent Stent) ×2 IMPLANT
TOWEL OR 17X26 4PK STRL BLUE (TOWEL DISPOSABLE) ×4 IMPLANT
WIRE G V18X300CM (WIRE) ×4 IMPLANT

## 2015-08-14 NOTE — Discharge Instructions (Signed)
Fistulogram, Care After °Refer to this sheet in the next few weeks. These instructions provide you with information on caring for yourself after your procedure. Your health care provider may also give you more specific instructions. Your treatment has been planned according to current medical practices, but problems sometimes occur. Call your health care provider if you have any problems or questions after your procedure. °WHAT TO EXPECT AFTER THE PROCEDURE °After your procedure, it is typical to have the following: °· A small amount of discomfort in the area where the catheters were placed. °· A small amount of bruising around the fistula. °· Sleepiness and fatigue. °HOME CARE INSTRUCTIONS °· Rest at home for the day following your procedure. °· Do not drive or operate heavy machinery while taking pain medicine. °· Take medicines only as directed by your health care provider. °· Do not take baths, swim, or use a hot tub until your health care provider approves. You may shower 24 hours after the procedure or as directed by your health care provider. °· There are many different ways to close and cover an incision, including stitches, skin glue, and adhesive strips. Follow your health care provider's instructions on: °¨ Incision care. °¨ Bandage (dressing) changes and removal. °¨ Incision closure removal. °· Monitor your dialysis fistula carefully. °SEEK MEDICAL CARE IF: °· You have drainage, redness, swelling, or pain at your catheter site. °· You have a fever. °· You have chills. °SEEK IMMEDIATE MEDICAL CARE IF: °· You feel weak. °· You have trouble balancing. °· You have trouble moving your arms or legs. °· You have problems with your speech or vision. °· You can no longer feel a vibration or buzz when you put your fingers over your dialysis fistula. °· The limb that was used for the procedure: °¨ Swells. °¨ Is painful. °¨ Is cold. °¨ Is discolored, such as blue or pale white. °  °This information is not intended  to replace advice given to you by your health care provider. Make sure you discuss any questions you have with your health care provider. °  °Document Released: 09/27/2013 Document Reviewed: 09/27/2013 °Elsevier Interactive Patient Education ©2016 [employer] ° °

## 2015-08-14 NOTE — H&P (Signed)
  [facility] History & Physical Update  The patient was interviewed and re-examined.  The patient's previous History and Physical has been reviewed and is unchanged.  There is no change in the plan of care. We plan to proceed with the scheduled procedure.  [name],[name], MD  08/14/2015, 12:38 PM

## 2015-08-14 NOTE — Op Note (Signed)
[facility]    OPERATIVE NOTE   PROCEDURE: 1.   Right arm arteriovenous fistula cannulation under ultrasound guidance 2.   Right arm fistulagram including central venogram 3.   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   Percutaneous transluminal angioplasty of subclavian vein and cephalic vein subclavian vein confluence with 855mdiameter and 9 mm diameter angioplasty balloon  PRE-OPERATIVE DIAGNOSIS: 1. ESRD 2. 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 As above  SPECIMEN(S):  None  CONTRAST: 35 cc  FLUORO TIME: 3.1 minutes  MODERATE CONSCIOUS SEDATION TIME: Approximately 25 minutes with 2 mg of Versed and 50 mcg of Fentanyl   INDICATIONS: [name] a [age].o. male who presents with malfunctioning  right arm arteriovenous fistula.  The patient is scheduled for  right arm fistulagram.  The patient is aware the risks include but are not limited to: bleeding, infection, thrombosis of the cannulated access, and possible anaphylactic reaction to the contrast.  The patient is aware of the risks of the procedure and elects to proceed forward.  DESCRIPTION: After full informed written consent was obtained, the patient was brought back to the angiography suite and placed supine upon the angiography table.  The patient was connected to monitoring equipment. Moderate conscious sedation was administered with a face to face encounter with the patient throughout the procedure with my supervision of the RN administering medicines and monitoring the patient's vital signs and mental status throughout from the start of the procedure until the patient was taken to the recovery room. The  right arm was  prepped and draped in the standard fashion for a percutaneous access intervention.  Under ultrasound guidance, the  right arm arteriovenous fistula was cannulated with a micropuncture needle under direct ultrasound guidance and a permanent image was performed.  The microwire was advanced into the fistula and the needle was exchanged for the a microsheath.  I then upsized to a 7 Fr Sheath and imaging was performed.  Hand injections were completed to image the access including the central venous system. This demonstrated aneurysmal degeneration of the access site which was within the jump graft portion as well as about an 80-85% stenosis in the cephalic vein subclavian vein confluence.  Based on the images, this patient will need covered stent placement to the aneurysms as well as intervention further central venous stenosis. I then gave the patient 3000 units of intravenous heparin.  I then crossed the stenosis with a Magic Tourqe wire.  Based on the imaging, a 8 mm x 6 cm  angioplasty balloon was selected.  The balloon was centered around the cephalic vein subclavian vein confluence stenosis and inflated to 12 ATM for 1 minute(s).  This was slightly undersized so I upsized to a 9 mm balloon inflated this to 10 atm for 1 minute. On completion imaging, a 25-30 % residual stenosis was present.   I exchanged for a 0.018 wire and elected to cover the aneurysmal degeneration of the jump graft with an 8 mm diameter by 15 cm length Viabahn covered stent. This was postdilated with the 9 mm balloon. Completion angiogram showed near complete resolution of the aneurysmal degeneration with brisk flow through the stent.  Based on the completion imaging, no further intervention is necessary.  The wire and balloon were removed from the sheath.  A 4-0 Monocryl purse-string suture was sewn around the sheath.  The sheath was removed while tying down the suture.  A sterile bandage was applied to the puncture  site.  COMPLICATIONS: None  CONDITION: Stable   [name]  08/14/2015 5:19 PM

## 2015-08-15 ENCOUNTER — Encounter: Payer: Self-pay | Admitting: Vascular Surgery

## 2015-10-21 ENCOUNTER — Emergency Department: Payer: Medicare Other

## 2015-10-21 ENCOUNTER — Encounter: Payer: Self-pay | Admitting: Emergency Medicine

## 2015-10-21 ENCOUNTER — Emergency Department
Admission: EM | Admit: 2015-10-21 | Discharge: 2015-10-21 | Disposition: A | Discharge status: Home / Self Care | Payer: Medicare Other | Attending: Emergency Medicine | Admitting: Emergency Medicine

## 2015-10-21 DIAGNOSIS — J45909 Unspecified asthma, uncomplicated: Secondary | ICD-10-CM | POA: Insufficient documentation

## 2015-10-21 DIAGNOSIS — E785 Hyperlipidemia, unspecified: Secondary | ICD-10-CM | POA: Diagnosis not present

## 2015-10-21 DIAGNOSIS — Z7951 Long term (current) use of inhaled steroids: Secondary | ICD-10-CM | POA: Insufficient documentation

## 2015-10-21 DIAGNOSIS — M79671 Pain in right foot: Secondary | ICD-10-CM | POA: Diagnosis present

## 2015-10-21 DIAGNOSIS — I509 Heart failure, unspecified: Secondary | ICD-10-CM | POA: Insufficient documentation

## 2015-10-21 DIAGNOSIS — N186 End stage renal disease: Secondary | ICD-10-CM | POA: Insufficient documentation

## 2015-10-21 DIAGNOSIS — Z7982 Long term (current) use of aspirin: Secondary | ICD-10-CM | POA: Diagnosis not present

## 2015-10-21 DIAGNOSIS — F17210 Nicotine dependence, cigarettes, uncomplicated: Secondary | ICD-10-CM | POA: Insufficient documentation

## 2015-10-21 DIAGNOSIS — I132 Hypertensive heart and chronic kidney disease with heart failure and with stage 5 chronic kidney disease, or end stage renal disease: Secondary | ICD-10-CM | POA: Insufficient documentation

## 2015-10-21 DIAGNOSIS — E1122 Type 2 diabetes mellitus with diabetic chronic kidney disease: Secondary | ICD-10-CM | POA: Diagnosis not present

## 2015-10-21 DIAGNOSIS — J449 Chronic obstructive pulmonary disease, unspecified: Secondary | ICD-10-CM | POA: Insufficient documentation

## 2015-10-21 DIAGNOSIS — G5791 Unspecified mononeuropathy of right lower limb: Secondary | ICD-10-CM | POA: Insufficient documentation

## 2015-10-21 DIAGNOSIS — G8929 Other chronic pain: Secondary | ICD-10-CM

## 2015-10-21 DIAGNOSIS — Z79899 Other long term (current) drug therapy: Secondary | ICD-10-CM | POA: Diagnosis not present

## 2015-10-21 DIAGNOSIS — M792 Neuralgia and neuritis, unspecified: Secondary | ICD-10-CM

## 2015-10-21 LAB — CBC WITH DIFFERENTIAL/PLATELET
Basophils Absolute: 0.1 10*3/uL (ref 0–0.1)
Basophils Relative: 1 %
EOS ABS: 0.2 10*3/uL (ref 0–0.7)
HCT: 36.8 % — ABNORMAL LOW (ref 40.0–52.0)
HEMOGLOBIN: 12.1 g/dL — AB (ref 13.0–18.0)
LYMPHS ABS: 1.4 10*3/uL (ref 1.0–3.6)
MCH: 31.1 pg (ref 26.0–34.0)
MCHC: 32.7 g/dL (ref 32.0–36.0)
MCV: 95.1 fL (ref 80.0–100.0)
MONO ABS: 0.7 10*3/uL (ref 0.2–1.0)
Neutro Abs: 2.9 10*3/uL (ref 1.4–6.5)
Neutrophils Relative %: 55 %
Platelets: 97 10*3/uL — ABNORMAL LOW (ref 150–440)
RBC: 3.88 MIL/uL — ABNORMAL LOW (ref 4.40–5.90)
RDW: 16.2 % — ABNORMAL HIGH (ref 11.5–14.5)
WBC: 5.2 10*3/uL (ref 3.8–10.6)

## 2015-10-21 LAB — COMPREHENSIVE METABOLIC PANEL
ALK PHOS: 78 U/L (ref 38–126)
ALT: 28 U/L (ref 17–63)
ANION GAP: 10 (ref 5–15)
AST: 39 U/L (ref 15–41)
Albumin: 3.5 g/dL (ref 3.5–5.0)
BUN: 32 mg/dL — ABNORMAL HIGH (ref 6–20)
CALCIUM: 9.1 mg/dL (ref 8.9–10.3)
CO2: 26 mmol/L (ref 22–32)
CREATININE: 7.76 mg/dL — AB (ref 0.61–1.24)
Chloride: 100 mmol/L — ABNORMAL LOW (ref 101–111)
GFR, EST AFRICAN AMERICAN: 8 mL/min — AB (ref >60)
GFR, EST NON AFRICAN AMERICAN: 7 mL/min — AB (ref >60)
Glucose, Bld: 84 mg/dL (ref 65–99)
Potassium: 3.9 mmol/L (ref 3.5–5.1)
SODIUM: 136 mmol/L (ref 135–145)
TOTAL PROTEIN: 7.8 g/dL (ref 6.5–8.1)
Total Bilirubin: 0.9 mg/dL (ref 0.3–1.2)

## 2015-10-21 LAB — SEDIMENTATION RATE: Sed Rate: 12 mm/hr (ref 0–20)

## 2015-10-21 MED ORDER — OXYCODONE HCL 5 MG PO TABS
10.0000 mg | ORAL_TABLET | Freq: Once | ORAL | Status: AC
  Administered 2015-10-21: 10 mg via ORAL
  Filled 2015-10-21: qty 2

## 2015-10-21 MED ORDER — OXYCODONE HCL 5 MG PO TABS: ORAL_TABLET | ORAL | Status: DC

## 2015-10-21 NOTE — Discharge Instructions (Signed)
You were evaluated for right foot pain and as we discussed, I don't see signs of infection, but if you develop fever, nausea, sweats, drainage, redness to the foot, swelling his leg, you should return to emergency department immediately for reevaluation.  We discussed, I think here pain is more chronic at this point, and may be due to nerve damage or neuropathy. Discussed chronic pain management with your primary care physician.   Chronic Pain Chronic pain can be defined as pain that is off and on and lasts for 3-6 months or longer. Many things cause chronic pain, which can make it difficult to make a diagnosis. There are many treatment options available for chronic pain. However, finding a treatment that works well for you may require trying various approaches until the right one is found. Many people benefit from a combination of two or more types of treatment to control their pain. SYMPTOMS  Chronic pain can occur anywhere in the body and can range from mild to very severe. Some types of chronic pain include:  Headache.  Low back pain.  Cancer pain.  Arthritis pain.  Neurogenic pain. This is pain resulting from damage to nerves. People with chronic pain may also have other symptoms such as:  Depression.  Anger.  Insomnia.  Anxiety. DIAGNOSIS  Your health care provider will help diagnose your condition over time. In many cases, the initial focus will be on excluding possible conditions that could be causing the pain. Depending on your symptoms, your health care provider may order tests to diagnose your condition. Some of these tests may include:   Blood tests.   CT scan.   MRI.   X-rays.   Ultrasounds.   Nerve conduction studies.  You may need to see a specialist.  TREATMENT  Finding treatment that works well may take time. You may be referred to a pain specialist. He or she may prescribe medicine or therapies, such as:   Mindful meditation or yoga.  Shots  (injections) of numbing or pain-relieving medicines into the spine or area of pain.  Local electrical stimulation.  Acupuncture.   Massage therapy.   Aroma, color, light, or sound therapy.   Biofeedback.   Working with a physical therapist to keep from getting stiff.   Regular, gentle exercise.   Cognitive or behavioral therapy.   Group support.  Sometimes, surgery may be recommended.  HOME CARE INSTRUCTIONS   Take all medicines as directed by your health care provider.   Lessen stress in your life by relaxing and doing things such as listening to calming music.   Exercise or be active as directed by your health care provider.   Eat a healthy diet and include things such as vegetables, fruits, fish, and lean meats in your diet.   Keep all follow-up appointments with your health care provider.   Attend a support group with others suffering from chronic pain. SEEK MEDICAL CARE IF:   Your pain gets worse.   You develop a new pain that was not there before.   You cannot tolerate medicines given to you by your health care provider.   You have new symptoms since your last visit with your health care provider.  SEEK IMMEDIATE MEDICAL CARE IF:   You feel weak.   You have decreased sensation or numbness.   You lose control of bowel or bladder function.   Your pain suddenly gets much worse.   You develop shaking.  You develop chills.  You develop confusion.  You develop chest pain.  You develop shortness of breath.  MAKE SURE YOU:  Understand these instructions.  Will watch your condition.  Will get help right away if you are not doing well or get worse.   This information is not intended to replace advice given to you by your health care provider. Make sure you discuss any questions you have with your health care provider.   Document Released: 02/02/2002 Document Revised: 01/13/2013 Document Reviewed: 11/06/2012 Elsevier Interactive  Patient Education [employer].

## 2015-10-21 NOTE — ED Notes (Signed)
Pt presents to ED with c/o right foot pain for over a week. Pt states he had his toes amputated a couple of weeks ago and he has been experiencing worsening pain to the right foot. Pt states the toes are not all completely healed and "still a little red". Pt states he doesn't have any more pain pills and he cant sleep, cant rest, and can get comfortable.

## 2015-10-21 NOTE — ED Provider Notes (Signed)
[facility] Emergency Department Provider Note   ____________________________________________  Time seen: Approximately 7:15am I have reviewed the triage vital signs and the triage nursing note.  HISTORY  Chief Complaint Foot Pain   Historian Patient  HPI [name] is a 60 y.o. male with ESRD, peripheral vascular disease with transmetatarsal right foot amputation due to her chronic diabetic nonhealing wound December 2016, is here complaining of pain in the right foot stump. Patient states that he wasin a "care home "fairly recently where he was taking 10 mg of oxycodone without acetaminophen for pain control, and when he was discharged from there he states he was not continued on pain medication.  He does not necessarily think that he is having a new problem causing the pain, but just that he does not have the oxycodone anymore.  He does not report any redness or change in skin discoloration, no new drainage, no fever or systemic symptoms, no swelling to the stomach.    Past Medical History  Diagnosis Date  . End stage renal disease ([place])   . HTN (hypertension)   . HLD (hyperlipidemia)   . Anemia   . Hyperparathyroidism   . Dialysis patient [facility])     Mon, Wed, Fri  . CHF (congestive heart failure) ([place])   . Asthma   . COPD (chronic obstructive pulmonary disease) ([place])   . Complication of anesthesia     hypotension  . Diabetes mellitus without complication ([place])   . GERD (gastroesophageal reflux disease)   . Shortness of breath dyspnea   . Peripheral vascular disease ([place])   . Sleep apnea   . Chronic systolic heart failure [facility])     Patient Active Problem List   Diagnosis Date Noted  . Preoperative cardiovascular examination 05/02/2015  . Chronic systolic heart failure ([place])   . Sepsis due to cellulitis ([place] [facility]) 01/13/2015  . Acute encephalopathy 01/13/2015  . Hyperkalemia 01/13/2015  . Gangrene of foot ([place]) 05/04/2014  . ESRD (end  stage renal disease) on dialysis ([place]) 05/04/2014  . CAD (coronary artery disease) 05/04/2014  . Normocytic anemia 05/04/2014  . HYPERLIPIDEMIA-MIXED 04/26/2010  . HYPERTENSION, BENIGN 04/26/2010  . CAD, NATIVE VESSEL 04/26/2010  . End stage renal disease ([place]) 04/26/2010    Past Surgical History  Procedure Laterality Date  . Amputation Left 05/06/2014    Procedure: AMPUTATION BELOW KNEE;  Surgeon: Elam Dutch, MD;  Location: [facility] OR;  Service: Vascular;  Laterality: Left;  . Peripheral vascular catheterization Right 12/15/2014    Procedure: Lower Extremity Angiography;  Surgeon: Algernon Huxley, MD;  Location: [place] CV LAB;  Service: Cardiovascular;  Laterality: Right;  . Peripheral vascular catheterization  12/15/2014    Procedure: Lower Extremity Intervention;  Surgeon: Algernon Huxley, MD;  Location: [place] CV LAB;  Service: Cardiovascular;;  . Amputation Right 01/12/2015    Procedure: Foot transmetatarsal amputation;  Surgeon: Algernon Huxley, MD;  Location: [facility] ORS;  Service: Vascular;  Laterality: Right;  . Application of wound vac Right 03/01/2015    Procedure: Application of Bio-connekt graft and wound vac application to right foot ;  Surgeon: Algernon Huxley, MD;  Location: [facility] ORS;  Service: Vascular;  Laterality: Right;  . Av fistula placement Left   . Cardiac catheterization      stent placement   . Transmetatarsal amputation Right 05/04/2015    Procedure: TRANSMETATARSAL AMPUTATION REVISION, great toe amputation;  Surgeon: Algernon Huxley, MD;  Location: [facility] ORS;  Service: Vascular;  Laterality: Right;  . Peripheral vascular catheterization Right 08/14/2015    Procedure: A/V Shuntogram/Fistulagram;  Surgeon: Algernon Huxley, MD;  Location: [place] CV LAB;  Service: Cardiovascular;  Laterality: Right;  . Peripheral vascular catheterization N/A 08/14/2015    Procedure: A/V Shunt Intervention;  Surgeon: Algernon Huxley, MD;  Location: [place] CV LAB;  Service: Cardiovascular;   Laterality: N/A;    Current Outpatient Rx  Name  Route  Sig  Dispense  Refill  . acetaminophen (TYLENOL) 500 MG tablet   Oral   Take 1,000 mg by mouth every 6 (six) hours as needed for moderate pain.         [name] albuterol (PROVENTIL HFA;VENTOLIN HFA) 108 (90 BASE) MCG/ACT inhaler   Inhalation   Inhale 1 puff into the lungs every 6 (six) hours as needed for wheezing or shortness of breath.         [name] aspirin 325 MG tablet   Oral   Take 325 mg by mouth daily.          . budesonide-formoterol (SYMBICORT) 160-4.5 MCG/ACT inhaler   Inhalation   Inhale 2 puffs into the lungs 2 (two) times daily. Reported on 08/14/2015         . calcium acetate (PHOSLO) 667 MG capsule   Oral   Take 667 mg by mouth 3 (three) times daily with meals.         . Darbepoetin Alfa (ARANESP) 40 MCG/0.4ML SOSY injection   Intravenous   Inject 0.4 mLs (40 mcg total) into the vein every Monday with hemodialysis. Patient not taking: Reported on 08/14/2015   8.4 mL      . docusate sodium (COLACE) 100 MG capsule   Oral   Take 100 mg by mouth 2 (two) times daily. Reported on 08/14/2015         . doxercalciferol (HECTOROL) 4 MCG/2ML injection   Intravenous   Inject 1.25 mLs (2.5 mcg total) into the vein every Monday, Wednesday, and Friday with hemodialysis.   2 mL      . ferric gluconate 125 mg in sodium chloride 0.9 % 100 mL   Intravenous   Inject 125 mg into the vein every Monday, Wednesday, and Friday with hemodialysis.         [name] gabapentin (NEURONTIN) 300 MG capsule   Oral   Take 1 capsule by mouth at bedtime.         [name] ibuprofen (ADVIL,MOTRIN) 600 MG tablet   Oral   Take 600 mg by mouth every 6 (six) hours as needed.         [name] ipratropium-albuterol (DUONEB) 0.5-2.5 (3) MG/3ML SOLN   Nebulization   Take 3 mLs by nebulization every 4 (four) hours as needed (SOB).         . metoprolol tartrate (LOPRESSOR) 25 MG tablet   Oral   Take 25 mg by mouth 2 (two) times daily.            . midodrine (PROAMATINE) 5 MG tablet   Oral   Take 5 mg by mouth. 3 times a week, 30 minutes before dialysis         . multivitamin (RENA-VIT) TABS tablet   Oral   Take 1 tablet by mouth at bedtime.      0   . nicotine (NICODERM CQ - DOSED IN MG/24 HOURS) 14 mg/24hr patch   Transdermal   Place 1 patch (14 mg total) onto the skin daily.   30 patch  0   . Nutritional Supplements (FEEDING SUPPLEMENT, NEPRO CARB STEADY,) LIQD   Oral   Take 237 mLs by mouth daily.         . ondansetron (ZOFRAN) 4 MG tablet   Oral   Take 4 mg by mouth every 6 (six) hours as needed for nausea or vomiting (Nausea).         [name] oxyCODONE (ROXICODONE) 5 MG immediate release tablet      1-2 tablets every 6 hours as needed for severe pain   15 tablet   0   . pantoprazole (PROTONIX) 20 MG tablet   Oral   Take 20 mg by mouth daily.         . sevelamer carbonate (RENVELA) 800 MG tablet   Oral   Take 800 mg by mouth 3 (three) times daily with meals.         . simvastatin (ZOCOR) 10 MG tablet   Oral   Take 1 tablet by mouth every evening.         . tiotropium (SPIRIVA) 18 MCG inhalation capsule   Inhalation   Place 18 mcg into inhaler and inhale daily.         [name] triamcinolone cream (KENALOG) 0.1 %   Topical   Apply 1 application topically daily as needed.           Allergies Dust mite extract  Family History  Problem Relation Age of Onset  . Heart failure Other   . Hypertension Other   . Leukemia Other   . Diabetes Other     Social History Social History  Substance Use Topics  . Smoking status: Current Every Day Smoker -- 0.50 packs/day    Types: Cigarettes  . Smokeless tobacco: Current User     Comment: 5  . Alcohol Use: No    Review of Systems  Constitutional: Negative for fevers or chills. Eyes: Negative for visual changes. ENT: Negative for sore throat. Cardiovascular: Negative for chest pain. Respiratory: Negative for shortness of  breath. Gastrointestinal: Occasional nausea and lower abdominal pain, currently none. Genitourinary: Negative for dysuria. Musculoskeletal: Negative for back pain. Skin: Negative for rash. Neurological: Negative for headache. 10 point Review of Systems otherwise negative ____________________________________________   PHYSICAL EXAM:  VITAL SIGNS: ED Triage Vitals  Enc Vitals Group     BP 10/21/15 0115 133/81 mmHg     Pulse Rate 10/21/15 0115 62     Resp 10/21/15 0115 18     Temp 10/21/15 0115 98.5 F (36.9 C)     Temp src --      SpO2 10/21/15 0115 100 %     Weight 10/21/15 0115 176 lb (79.833 kg)     Height 10/21/15 0115 6' 2"  (1.88 m)     Head Cir --      Peak Flow --      Pain Score 10/21/15 0119 10     Pain Loc --      Pain Edu? --      Excl. in [place]? --      Constitutional: Alert and oriented. Well appearing and in no distress. HEENT   Head: Normocephalic and atraumatic.      Eyes: Conjunctivae are normal. PERRL. Normal extraocular movements.      Ears:         Nose: No congestion/rhinnorhea.   Mouth/Throat: Mucous membranes are moist.   Neck: No stridor. Cardiovascular/Chest: Normal rate, regular rhythm.  No murmurs, rubs, or gallops. Respiratory: Normal respiratory  effort without tachypnea nor retractions. Breath sounds are clear and equal bilaterally. No wheezes/rales/rhonchi. Gastrointestinal: Soft. No distention, no guarding, no rebound. Nontender.    Genitourinary/rectal:Deferred Musculoskeletal: Right foot m-t amputation stump with no significant edema.  Some dark chronic appearing skin without redness or cellulitis.  Few areas of chronic healing wound, no drainage.  No point tenderness, patient reports pain across stump and foot. Neurologic:  Normal speech and language. No gross or focal neurologic deficits are appreciated. Skin:  Skin is warm, dry and intact. No rash noted. Psychiatric: Mood and affect are normal. Speech and behavior are normal.  Patient exhibits appropriate insight and judgment.  ____________________________________________   EKG I, Lisa Roca, MD, the attending physician have personally viewed and interpreted all ECGs.  None ____________________________________________  LABS (pertinent positives/negatives)  Labs Reviewed  CBC WITH DIFFERENTIAL/PLATELET - Abnormal; Notable for the following:    RBC 3.88 (*)    Hemoglobin 12.1 (*)    HCT 36.8 (*)    RDW 16.2 (*)    Platelets 97 (*)    All other components within normal limits  COMPREHENSIVE METABOLIC PANEL - Abnormal; Notable for the following:    Chloride 100 (*)    BUN 32 (*)    Creatinine, Ser 7.76 (*)    GFR calc non Af Amer 7 (*)    GFR calc Af Amer 8 (*)    All other components within normal limits  SEDIMENTATION RATE    ____________________________________________  RADIOLOGY All Xrays were viewed by me. Imaging interpreted by Radiologist.  Foot complete:  IMPRESSION: Post transmetatarsal amputation. Second metatarsal stump appears mildly irregular, however partially obscured. Recommend correlation for clinical signs and symptoms of osteomyelitis.  __________________________________________  PROCEDURES  Procedure(s) performed: None  Critical Care performed: None  ____________________________________________   ED COURSE / ASSESSMENT AND PLAN  Pertinent labs & imaging results that were available during my care of the patient were reviewed by me and considered in my medical decision making (see chart for details).   Clinically I am less suspicious for osteomyelitis, without any fevers or change in appearance of foot.  Patient feels like he's had pain since the amputation in December -- and just doesn't have oxycodone right now.  I don't see evidence of an acute cellulitis.  His x-ray is equivocal, and I have sent blood work to make sure there are no other indicators raising suspicion for osteomyelitis such as elevated ESR  or elevated white blood cell count.  I discussed with the patient that at this point seems like his pain is more along the lines of chronic pain, may be neuropathic in origin either from the diabetes and peripheral vascular disease or from the amputation surgery itself.  He is a dialysis patient, and I will leave dosing of potential neuropathic pain agents to his primary care physician, or possibly even a pain clinic referral from his primary care physician.  I'm going go ahead and give him a dose of oxycodone here for pain relief, and I will treat him over this holiday weekend so that he can see his primary care doctor this coming week.     CONSULTATIONS:   none  Patient / Family / Caregiver informed of clinical course, medical decision-making process, and agree with plan.   I discussed return precautions, follow-up instructions, and discharged instructions with patient and/or family.   ___________________________________________   FINAL CLINICAL IMPRESSION(S) / ED DIAGNOSES   Final diagnoses:  Neuropathic pain of foot, right  Chronic pain  Note: This dictation was prepared with Dragon dictation. Any transcriptional errors that result from this process are unintentional   Lisa Roca, MD 10/21/15 [phone]

## 2015-10-21 NOTE — ED Notes (Addendum)
Pt presents to ED with LLQ pain 2-3 weeks with nausea, states is having right foot x 2 weeks after being discharged from [facility] for foot amputation. Pt states was discharged without pain medication. +2 edema noted to right lower extremity. Pt denies chest pain or shortness of breath. Pt alert and oriented x 4, skin warm and dry. Family at bedside.

## 2015-12-26 ENCOUNTER — Emergency Department
Admission: EM | Admit: 2015-12-26 | Discharge: 2015-12-26 | Disposition: A | Discharge status: Home / Self Care | Payer: Medicare Other | Attending: Emergency Medicine | Admitting: Emergency Medicine

## 2015-12-26 ENCOUNTER — Emergency Department: Payer: Medicare Other

## 2015-12-26 ENCOUNTER — Encounter: Payer: Self-pay | Admitting: Emergency Medicine

## 2015-12-26 DIAGNOSIS — I132 Hypertensive heart and chronic kidney disease with heart failure and with stage 5 chronic kidney disease, or end stage renal disease: Secondary | ICD-10-CM | POA: Insufficient documentation

## 2015-12-26 DIAGNOSIS — E1122 Type 2 diabetes mellitus with diabetic chronic kidney disease: Secondary | ICD-10-CM | POA: Insufficient documentation

## 2015-12-26 DIAGNOSIS — N186 End stage renal disease: Secondary | ICD-10-CM | POA: Insufficient documentation

## 2015-12-26 DIAGNOSIS — R109 Unspecified abdominal pain: Secondary | ICD-10-CM | POA: Insufficient documentation

## 2015-12-26 DIAGNOSIS — Z7951 Long term (current) use of inhaled steroids: Secondary | ICD-10-CM | POA: Insufficient documentation

## 2015-12-26 DIAGNOSIS — Z7982 Long term (current) use of aspirin: Secondary | ICD-10-CM | POA: Insufficient documentation

## 2015-12-26 DIAGNOSIS — I5022 Chronic systolic (congestive) heart failure: Secondary | ICD-10-CM | POA: Insufficient documentation

## 2015-12-26 DIAGNOSIS — F17210 Nicotine dependence, cigarettes, uncomplicated: Secondary | ICD-10-CM | POA: Insufficient documentation

## 2015-12-26 LAB — CBC WITH DIFFERENTIAL/PLATELET
Basophils Absolute: 0.1 10*3/uL (ref 0–0.1)
Basophils Relative: 2 %
EOS ABS: 0.2 10*3/uL (ref 0–0.7)
EOS PCT: 4 %
HCT: 38.6 % — ABNORMAL LOW (ref 40.0–52.0)
Hemoglobin: 12.9 g/dL — ABNORMAL LOW (ref 13.0–18.0)
LYMPHS ABS: 1.2 10*3/uL (ref 1.0–3.6)
Lymphocytes Relative: 23 %
MCH: 31.9 pg (ref 26.0–34.0)
MCHC: 33.4 g/dL (ref 32.0–36.0)
MCV: 95.6 fL (ref 80.0–100.0)
MONOS PCT: 13 %
Monocytes Absolute: 0.7 10*3/uL (ref 0.2–1.0)
Neutro Abs: 3 10*3/uL (ref 1.4–6.5)
Neutrophils Relative %: 58 %
PLATELETS: 106 10*3/uL — AB (ref 150–440)
RBC: 4.04 MIL/uL — ABNORMAL LOW (ref 4.40–5.90)
RDW: 16.5 % — ABNORMAL HIGH (ref 11.5–14.5)
WBC: 5.2 10*3/uL (ref 3.8–10.6)

## 2015-12-26 LAB — COMPREHENSIVE METABOLIC PANEL
ALT: 28 U/L (ref 17–63)
ANION GAP: 11 (ref 5–15)
AST: 34 U/L (ref 15–41)
Albumin: 3.5 g/dL (ref 3.5–5.0)
Alkaline Phosphatase: 74 U/L (ref 38–126)
BUN: 62 mg/dL — ABNORMAL HIGH (ref 6–20)
CHLORIDE: 95 mmol/L — AB (ref 101–111)
CO2: 27 mmol/L (ref 22–32)
Calcium: 9.5 mg/dL (ref 8.9–10.3)
Creatinine, Ser: 8.5 mg/dL — ABNORMAL HIGH (ref 0.61–1.24)
GFR calc non Af Amer: 6 mL/min — ABNORMAL LOW (ref >60)
GFR, EST AFRICAN AMERICAN: 7 mL/min — AB (ref >60)
Glucose, Bld: 93 mg/dL (ref 65–99)
Potassium: 5.8 mmol/L — ABNORMAL HIGH (ref 3.5–5.1)
SODIUM: 133 mmol/L — AB (ref 135–145)
Total Bilirubin: 0.9 mg/dL (ref 0.3–1.2)
Total Protein: 7.9 g/dL (ref 6.5–8.1)

## 2015-12-26 LAB — LIPASE, BLOOD: Lipase: 44 U/L (ref 11–51)

## 2015-12-26 MED ORDER — SUCRALFATE 1 G PO TABS: 1.0000 g | ORAL_TABLET | Freq: Four times a day (QID) | ORAL | 0 refills | Status: DC

## 2015-12-26 MED ORDER — ONDANSETRON HCL 4 MG PO TABS: 4.0000 mg | ORAL_TABLET | Freq: Three times a day (TID) | ORAL | 0 refills | Status: DC | PRN

## 2015-12-26 NOTE — ED Provider Notes (Signed)
[facility] Emergency Department Provider Note    ____________________________________________   I have reviewed the triage vital signs and the nursing notes.   HISTORY  Chief Complaint Abdominal Pain Right foot pain  History limited by: Not Limited   HPI [name] is a 60 y.o. male with history of end-stage renal disease, dialysis, who presents to the emergency department today with primary complaints of abdominal pain and right foot pain. The patient states that he has abdominal pain on and off. This is been going on for months. He states that the pain he feels today is similar to the previous pain he has had. He states he has seen doctors for this in the past at one point was told that he has gallstones however that given his history they would not want to operate unless it was an emergency. He additionally has what sounds like chronic right foot pain. This is likely secondary to amputations. The denies any concern for any current infection. He states the pain is worse at night. He currently has run out of his pain medications and try to follow up with his primary care doctor today to refill his pain medications. He was able to make an appointment for tomorrow at 5:30. He denies any fevers.   Past Medical History:  Diagnosis Date  . Anemia   . Asthma   . CHF (congestive heart failure) ([place])   . Chronic systolic heart failure ([place])   . Complication of anesthesia    hypotension  . COPD (chronic obstructive pulmonary disease) ([place])   . Diabetes mellitus without complication ([place])   . Dialysis patient ([place])    Mon, Wed, Fri  . End stage renal disease ([place])   . GERD (gastroesophageal reflux disease)   . HLD (hyperlipidemia)   . HTN (hypertension)   . Hyperparathyroidism   . Peripheral vascular disease ([place])   . Shortness of breath dyspnea   . Sleep apnea     Patient Active Problem List   Diagnosis Date Noted  . Preoperative cardiovascular  examination 05/02/2015  . Chronic systolic heart failure ([place])   . Sepsis due to cellulitis ([place]) 01/13/2015  . Acute encephalopathy 01/13/2015  . Hyperkalemia 01/13/2015  . Gangrene of foot ([place]) 05/04/2014  . ESRD (end stage renal disease) on dialysis ([place]) 05/04/2014  . CAD (coronary artery disease) 05/04/2014  . Normocytic anemia 05/04/2014  . HYPERLIPIDEMIA-MIXED 04/26/2010  . HYPERTENSION, BENIGN 04/26/2010  . CAD, NATIVE VESSEL 04/26/2010  . End stage renal disease ([place]) 04/26/2010    Past Surgical History:  Procedure Laterality Date  . AMPUTATION Left 05/06/2014   Procedure: AMPUTATION BELOW KNEE;  Surgeon: Elam Dutch, MD;  Location: [place];  Service: Vascular;  Laterality: Left;  . AMPUTATION Right 01/12/2015   Procedure: Foot transmetatarsal amputation;  Surgeon: Algernon Huxley, MD;  Location: [facility];  Service: Vascular;  Laterality: Right;  . APPLICATION OF WOUND VAC Right 03/01/2015   Procedure: Application of Bio-connekt graft and wound vac application to right foot ;  Surgeon: Algernon Huxley, MD;  Location: [facility];  Service: Vascular;  Laterality: Right;  . AV FISTULA PLACEMENT Left   . CARDIAC CATHETERIZATION     stent placement   . PERIPHERAL VASCULAR CATHETERIZATION Right 12/15/2014   Procedure: Lower Extremity Angiography;  Surgeon: Algernon Huxley, MD;  Location: [place] [facility];  Service: Cardiovascular;  Laterality: Right;  . PERIPHERAL VASCULAR CATHETERIZATION  12/15/2014   Procedure: Lower Extremity Intervention;  Surgeon: Algernon Huxley, MD;  Location: [place] [facility];  Service: Cardiovascular;;  . PERIPHERAL VASCULAR CATHETERIZATION Right 08/14/2015   Procedure: A/V Shuntogram/Fistulagram;  Surgeon: Algernon Huxley, MD;  Location: [place] [facility];  Service: Cardiovascular;  Laterality: Right;  . PERIPHERAL VASCULAR CATHETERIZATION N/A 08/14/2015   Procedure: A/V Shunt Intervention;  Surgeon: Algernon Huxley, MD;  Location: [place] [facility];  Service:  Cardiovascular;  Laterality: N/A;  . TRANSMETATARSAL AMPUTATION Right 05/04/2015   Procedure: TRANSMETATARSAL AMPUTATION REVISION, great toe amputation;  Surgeon: Algernon Huxley, MD;  Location: [facility];  Service: Vascular;  Laterality: Right;    Prior to Admission medications   Medication Sig Start Date End Date Taking? Authorizing Provider  acetaminophen (TYLENOL) 500 MG tablet Take 1,000 mg by mouth every 6 (six) hours as needed for moderate pain.    Historical Provider, MD  albuterol (PROVENTIL HFA;VENTOLIN HFA) 108 (90 BASE) MCG/ACT inhaler Inhale 1 puff into the lungs every 6 (six) hours as needed for wheezing or shortness of breath.    Historical Provider, MD  aspirin 325 MG tablet Take 325 mg by mouth daily.     Historical Provider, MD  budesonide-formoterol (SYMBICORT) 160-4.5 MCG/ACT inhaler Inhale 2 puffs into the lungs 2 (two) times daily. Reported on 08/14/2015    Historical Provider, MD  calcium acetate (PHOSLO) 667 MG capsule Take 667 mg by mouth 3 (three) times daily with meals.    Historical Provider, MD  Darbepoetin Alfa (ARANESP) 40 MCG/0.4ML SOSY injection Inject 0.4 mLs (40 mcg total) into the vein every Monday with hemodialysis. Patient not taking: Reported on 08/14/2015 05/16/14   Jonetta Osgood, MD  docusate sodium (COLACE) 100 MG capsule Take 100 mg by mouth 2 (two) times daily. Reported on 08/14/2015    Historical Provider, MD  doxercalciferol (HECTOROL) 4 MCG/2ML injection Inject 1.25 mLs (2.5 mcg total) into the vein every Monday, Wednesday, and Friday with hemodialysis. 05/10/14   Shanker Kristeen Mans, MD  ferric gluconate 125 mg in sodium chloride 0.9 % 100 mL Inject 125 mg into the vein every Monday, Wednesday, and Friday with hemodialysis. 05/10/14   Shanker Kristeen Mans, MD  gabapentin (NEURONTIN) 300 MG capsule Take 1 capsule by mouth at bedtime. 12/23/14   Historical Provider, MD  ibuprofen (ADVIL,MOTRIN) 600 MG tablet Take 600 mg by mouth every 6 (six) hours as needed.     Historical Provider, MD  ipratropium-albuterol (DUONEB) 0.5-2.5 (3) MG/3ML SOLN Take 3 mLs by nebulization every 4 (four) hours as needed (SOB).    Historical Provider, MD  metoprolol tartrate (LOPRESSOR) 25 MG tablet Take 25 mg by mouth 2 (two) times daily.      Historical Provider, MD  midodrine (PROAMATINE) 5 MG tablet Take 5 mg by mouth. 3 times a week, 30 minutes before dialysis    Historical Provider, MD  multivitamin (RENA-VIT) TABS tablet Take 1 tablet by mouth at bedtime. 05/10/14   Shanker Kristeen Mans, MD  nicotine (NICODERM CQ - DOSED IN MG/24 HOURS) 14 mg/24hr patch Place 1 patch (14 mg total) onto the skin daily. 05/02/15   Wellington Hampshire, MD  Nutritional Supplements (FEEDING SUPPLEMENT, NEPRO CARB STEADY,) LIQD Take 237 mLs by mouth daily.    Historical Provider, MD  ondansetron (ZOFRAN) 4 MG tablet Take 4 mg by mouth every 6 (six) hours as needed for nausea or vomiting (Nausea).    Historical Provider, MD  oxyCODONE (ROXICODONE) 5 MG immediate release tablet 1-2 tablets every 6 hours as  needed for severe pain 10/21/15   Lisa Roca, MD  pantoprazole (PROTONIX) 20 MG tablet Take 20 mg by mouth daily.    Historical Provider, MD  sevelamer carbonate (RENVELA) 800 MG tablet Take 800 mg by mouth 3 (three) times daily with meals.    Historical Provider, MD  simvastatin (ZOCOR) 10 MG tablet Take 1 tablet by mouth every evening. 12/23/14   Historical Provider, MD  tiotropium (SPIRIVA) 18 MCG inhalation capsule Place 18 mcg into inhaler and inhale daily.    Historical Provider, MD  triamcinolone cream (KENALOG) 0.1 % Apply 1 application topically daily as needed. 11/07/14   Historical Provider, MD    Allergies Dust mite extract  Family History  Problem Relation Age of Onset  . Heart failure Other   . Hypertension Other   . Leukemia Other   . Diabetes Other     Social History Social History  Substance Use Topics  . Smoking status: Current Every Day Smoker    Packs/day: 0.50     Types: Cigarettes  . Smokeless tobacco: Current User     Comment: 5  . Alcohol use No    Review of Systems  Constitutional: Negative for fever. Cardiovascular: Negative for chest pain. Respiratory: Negative for shortness of breath. Gastrointestinal: Positive for abdominal pain. Genitourinary: Negative for dysuria. Musculoskeletal: Negative for back pain. Positive for right foot pain. Skin: Negative for rash. Neurological: Negative for headaches, focal weakness or numbness.  10-point ROS otherwise negative.  ____________________________________________   PHYSICAL EXAM:  VITAL SIGNS: ED Triage Vitals  Enc Vitals Group     BP 12/26/15 1459 126/83     Pulse Rate 12/26/15 1459 64     Resp 12/26/15 1459 20     Temp 12/26/15 1459 97.5 F (36.4 C)     Temp Source 12/26/15 1459 Oral     SpO2 12/26/15 1459 100 %     Weight 12/26/15 1459 177 lb (80.3 kg)     Height 12/26/15 1459 6\' 2"  (1.88 m)     Head Circumference --      Peak Flow --      Pain Score 12/26/15 1500 9   Constitutional: Alert and oriented. Well appearing and in no distress. Eyes: Conjunctivae are normal. PERRL. Normal extraocular movements. ENT   Head: Normocephalic and atraumatic.   Nose: No congestion/rhinnorhea.   Mouth/Throat: Mucous membranes are moist.   Neck: No stridor. Hematological/Lymphatic/Immunilogical: No cervical lymphadenopathy. Cardiovascular: Normal rate, regular rhythm.  No murmurs, rubs, or gallops. Respiratory: Normal respiratory effort without tachypnea nor retractions. Breath sounds are clear and equal bilaterally. No wheezes/rales/rhonchi. Gastrointestinal: Soft and nontender. Slightly distended. No rebound. No guarding.  Genitourinary: Deferred Musculoskeletal: Right foot in boot and wrapped. 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 Neurologic:  Normal speech and language. No gross focal neurologic deficits are appreciated.  Skin:  Skin is warm, dry and intact. No rash  noted. Psychiatric: Mood and affect are normal. Speech and behavior are normal. Patient exhibits appropriate insight and judgment.  ____________________________________________    LABS (pertinent positives/negatives)  Labs Reviewed  CBC WITH DIFFERENTIAL/PLATELET - Abnormal; Notable for the following:       Result Value   RBC 4.04 (*)    Hemoglobin 12.9 (*)    HCT 38.6 (*)    RDW 16.5 (*)    Platelets 106 (*)    All other components within normal limits  COMPREHENSIVE METABOLIC PANEL - Abnormal; Notable for the following:    Sodium 133 (*)  Potassium 5.8 (*)    Chloride 95 (*)    BUN 62 (*)    Creatinine, Ser 8.50 (*)    GFR calc non Af Amer 6 (*)    GFR calc Af Amer 7 (*)    All other components within normal limits  LIPASE, BLOOD     ____________________________________________   EKG  I, Nance Pear, attending physician, personally viewed and interpreted this EKG  EKG Time: 1507 Rate: 63 Rhythm: normal sinus rhythm Axis: left axis deviation Intervals: qtc 462 QRS: LBBB ST changes: no st elevation equivalent Impression: abnormal ekg   ____________________________________________    RADIOLOGY  CXR IMPRESSION: 1. Stable cardiomegaly. 2. Increased changes at the right lung base consisting of pleural change and atelectasis or infiltrate. Consider further evaluation with CT of the chest, abdomen, and pelvis. 3. Nonobstructed bowel-gas pattern.   ____________________________________________   PROCEDURES  Procedures  ____________________________________________   INITIAL IMPRESSION / ASSESSMENT AND PLAN / ED COURSE  Pertinent labs & imaging results that were available during my care of the patient were reviewed by me and considered in my medical decision making (see chart for details).  Patient presented to the emergency department today with concerns for chronic intermittent abdominal pain and chronic right foot pain. Patient portion has  run out of his pain medications however does have an appointment with his primary care doctor tomorrow. I'll exam patient's abdomen was nontender. He had some mild distention. No leukocytosis on blood work. Will plan on getting an x-ray to evaluate the distention.  Clinical Course   Xray does not show any findings that would explain the patients chronic pain. It did show some opacity in the lung field. At this point I doubt pneumonia given no infectious symptoms, did however discuss this finding with the patient and recommended he discuss this with his PCP tomorrow. Will discharge with antiemetic and sucralfate.  ____________________________________________   FINAL CLINICAL IMPRESSION(S) / ED DIAGNOSES  Final diagnoses:  Abdominal pain, unspecified abdominal location     Note: This dictation was prepared with Dragon dictation. Any transcriptional errors that result from this process are unintentional    Nance Pear, MD 12/26/15 1713

## 2015-12-26 NOTE — ED Notes (Signed)
Pt returned from X-ray.  

## 2015-12-26 NOTE — ED Triage Notes (Signed)
Reports abd pain x 2 wks. some diarrhea. NAD

## 2015-12-26 NOTE — Discharge Instructions (Signed)
Please seek medical attention for any high fevers, chest pain, shortness of breath, change in behavior, persistent vomiting, bloody stool or any other new or concerning symptoms.  

## 2016-01-10 ENCOUNTER — Other Ambulatory Visit: Payer: Self-pay | Admitting: Vascular Surgery

## 2016-01-11 ENCOUNTER — Other Ambulatory Visit: Payer: Self-pay | Admitting: Vascular Surgery

## 2016-01-11 ENCOUNTER — Ambulatory Visit
Admission: RE | Admit: 2016-01-11 | Discharge: 2016-01-11 | Disposition: A | Discharge status: Home / Self Care | Payer: Medicare Other | Source: Ambulatory Visit | Attending: Vascular Surgery | Admitting: Vascular Surgery

## 2016-01-11 ENCOUNTER — Encounter
Admission: RE | Disposition: A | Discharge status: Home / Self Care | Payer: Self-pay | Source: Ambulatory Visit | Attending: Vascular Surgery

## 2016-01-11 DIAGNOSIS — Z89519 Acquired absence of unspecified leg below knee: Secondary | ICD-10-CM | POA: Insufficient documentation

## 2016-01-11 DIAGNOSIS — Z992 Dependence on renal dialysis: Secondary | ICD-10-CM | POA: Insufficient documentation

## 2016-01-11 DIAGNOSIS — L97509 Non-pressure chronic ulcer of other part of unspecified foot with unspecified severity: Secondary | ICD-10-CM | POA: Insufficient documentation

## 2016-01-11 DIAGNOSIS — Y832 Surgical operation with anastomosis, bypass or graft as the cause of abnormal reaction of the patient, or of later complication, without mention of misadventure at the time of the procedure: Secondary | ICD-10-CM | POA: Insufficient documentation

## 2016-01-11 DIAGNOSIS — I2510 Atherosclerotic heart disease of native coronary artery without angina pectoris: Secondary | ICD-10-CM | POA: Diagnosis not present

## 2016-01-11 DIAGNOSIS — N186 End stage renal disease: Secondary | ICD-10-CM | POA: Insufficient documentation

## 2016-01-11 DIAGNOSIS — F17200 Nicotine dependence, unspecified, uncomplicated: Secondary | ICD-10-CM | POA: Insufficient documentation

## 2016-01-11 DIAGNOSIS — Z7982 Long term (current) use of aspirin: Secondary | ICD-10-CM | POA: Diagnosis not present

## 2016-01-11 DIAGNOSIS — T82898A Other specified complication of vascular prosthetic devices, implants and grafts, initial encounter: Secondary | ICD-10-CM | POA: Diagnosis not present

## 2016-01-11 DIAGNOSIS — Z89439 Acquired absence of unspecified foot: Secondary | ICD-10-CM | POA: Diagnosis not present

## 2016-01-11 DIAGNOSIS — I739 Peripheral vascular disease, unspecified: Secondary | ICD-10-CM | POA: Diagnosis not present

## 2016-01-11 DIAGNOSIS — I509 Heart failure, unspecified: Secondary | ICD-10-CM | POA: Diagnosis not present

## 2016-01-11 DIAGNOSIS — R531 Weakness: Secondary | ICD-10-CM | POA: Diagnosis not present

## 2016-01-11 DIAGNOSIS — E7800 Pure hypercholesterolemia, unspecified: Secondary | ICD-10-CM | POA: Insufficient documentation

## 2016-01-11 DIAGNOSIS — I132 Hypertensive heart and chronic kidney disease with heart failure and with stage 5 chronic kidney disease, or end stage renal disease: Secondary | ICD-10-CM | POA: Diagnosis not present

## 2016-01-11 HISTORY — PX: PERIPHERAL VASCULAR CATHETERIZATION: SHX172C

## 2016-01-11 LAB — POTASSIUM (ARMC VASCULAR LAB ONLY): Potassium (ARMC vascular lab): 6 — ABNORMAL HIGH (ref 3.5–5.1)

## 2016-01-11 SURGERY — DIALYSIS/PERMA CATHETER INSERTION
Anesthesia: Moderate Sedation

## 2016-01-11 MED ORDER — MIDAZOLAM HCL 2 MG/2ML IJ SOLN
INTRAMUSCULAR | Status: DC | PRN
  Administered 2016-01-11 (×2): 2 mg via INTRAVENOUS

## 2016-01-11 MED ORDER — FAMOTIDINE 20 MG PO TABS: 40.0000 mg | ORAL_TABLET | ORAL | Status: DC | PRN

## 2016-01-11 MED ORDER — HYDROMORPHONE HCL 1 MG/ML IJ SOLN: 1.0000 mg | Freq: Once | INTRAMUSCULAR | Status: DC

## 2016-01-11 MED ORDER — HEPARIN SODIUM (PORCINE) 10000 UNIT/ML IJ SOLN
INTRAMUSCULAR | Status: AC
  Filled 2016-01-11: qty 1

## 2016-01-11 MED ORDER — MIDAZOLAM HCL 5 MG/5ML IJ SOLN
INTRAMUSCULAR | Status: AC
  Filled 2016-01-11: qty 5

## 2016-01-11 MED ORDER — HEPARIN (PORCINE) IN NACL 2-0.9 UNIT/ML-% IJ SOLN
INTRAMUSCULAR | Status: AC
  Filled 2016-01-11: qty 500

## 2016-01-11 MED ORDER — SODIUM CHLORIDE 0.9 % IV SOLN
INTRAVENOUS | Status: DC
  Administered 2016-01-11: 1000 mL via INTRAVENOUS

## 2016-01-11 MED ORDER — FENTANYL CITRATE (PF) 100 MCG/2ML IJ SOLN
INTRAMUSCULAR | Status: DC | PRN
  Administered 2016-01-11 (×2): 50 ug via INTRAVENOUS

## 2016-01-11 MED ORDER — LIDOCAINE-EPINEPHRINE (PF) 1 %-1:200000 IJ SOLN
INTRAMUSCULAR | Status: AC
  Filled 2016-01-11: qty 30

## 2016-01-11 MED ORDER — ONDANSETRON HCL 4 MG/2ML IJ SOLN: 4.0000 mg | Freq: Four times a day (QID) | INTRAMUSCULAR | Status: DC | PRN

## 2016-01-11 MED ORDER — HEPARIN SODIUM (PORCINE) 1000 UNIT/ML IJ SOLN
INTRAMUSCULAR | Status: AC
Start: 2016-01-11 — End: 2016-01-11
  Filled 2016-01-11: qty 1

## 2016-01-11 MED ORDER — DEXTROSE 5 % IV SOLN
1.5000 g | INTRAVENOUS | Status: AC
  Administered 2016-01-11: 1.5 g via INTRAVENOUS

## 2016-01-11 MED ORDER — METHYLPREDNISOLONE SODIUM SUCC 125 MG IJ SOLR: 125.0000 mg | INTRAMUSCULAR | Status: DC | PRN

## 2016-01-11 MED ORDER — FENTANYL CITRATE (PF) 100 MCG/2ML IJ SOLN
INTRAMUSCULAR | Status: AC
  Filled 2016-01-11: qty 2

## 2016-01-11 SURGICAL SUPPLY — 4 items
CANNULA 5F STIFF (CANNULA) ×3 IMPLANT
CATH PALINDROME RT-P 15FX19CM (CATHETERS) ×3 IMPLANT
PACK ANGIOGRAPHY (CUSTOM PROCEDURE TRAY) ×3 IMPLANT
TOWEL OR 17X26 4PK STRL BLUE (TOWEL DISPOSABLE) ×3 IMPLANT

## 2016-01-11 NOTE — Op Note (Signed)
OPERATIVE NOTE    PRE-OPERATIVE DIAGNOSIS: 1. ESRD 2. 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 Ultrasound guidance for vascular access to the right internal jugular vein 2. Fluoroscopic guidance for placement of catheter 3. Placement of a 19 cm tip to cuff tunneled hemodialysis catheter via the right internal jugular vein  SURGEON: Leotis Pain, MD  ANESTHESIA:  Local with Moderate conscious sedation for approximately 20 minutes using 4 mg of Versed and 100 mcg of Fentanyl  ESTIMATED BLOOD LOSS: 50 cc  FLUORO TIME: 0.1 minutes  CONTRAST: 0 cc  FINDING(S): 1.  Patent right internal jugular vein  SPECIMEN(S):  None  INDICATIONS:   [name] is a 60 y.o. 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  The patient needs long term dialysis access for their ESRD, and a Permcath is necessary.  Risks and benefits are discussed and informed consent is obtained.    DESCRIPTION: After obtaining full informed written consent, the patient was brought back to the vascular suited. The patient's right neck and chest were sterilely prepped and draped in a sterile surgical field was created. Moderate conscious sedation was administered during a face to face encounter with the patient throughout the procedure with my supervision of the RN administering medicines and monitoring the patient's vital signs, pulse oximetry, telemetry and mental status throughout from the start of the procedure until the patient was taken to the recovery room.  The right internal jugular vein was visualized with ultrasound and found to be patent. It was then accessed under direct ultrasound guidance and a permanent image was recorded. A wire was placed. After skin nick and dilatation, the peel-away sheath was placed over the wire. I then turned my attention to an  area under the clavicle. Approximately 1-2 fingerbreadths below the clavicle a small counterincision was created and tunneled from the subclavicular incision to the access site. Using fluoroscopic guidance, a 19 centimeter tip to cuff tunneled hemodialysis catheter was selected, and tunneled from the subclavicular incision to the access site. It was then placed through the peel-away sheath and the peel-away sheath was removed. Using fluoroscopic guidance the catheter tips were parked in the right atrium. The appropriate distal connectors were placed. It withdrew blood well and flushed easily with heparinized saline and a concentrated heparin solution was then placed. It was secured to the chest wall with 2 Prolene sutures. The access incision was closed single 4-0 Monocryl. A 4-0 Monocryl pursestring suture was placed around the exit site. Sterile dressings were placed. The patient tolerated the procedure well and was taken to the recovery room in stable condition.  COMPLICATIONS: None  CONDITION: Stable  [name],Joel  01/11/2016, 9:45 AM

## 2016-01-11 NOTE — Progress Notes (Signed)
Patient awake and resting comfortably. Family at bedside. Patient tolerating PO food and drink. Site is WNL.

## 2016-01-11 NOTE — H&P (Signed)
  [facility] History & Physical Update  The patient was interviewed and re-examined.  The patient's previous History and Physical has been reviewed and is unchanged.  There is no change in the plan of care. We plan to proceed with the scheduled procedure.  Olin Gurski, MD  01/11/2016, 8:58 AM

## 2016-01-12 ENCOUNTER — Encounter: Payer: Self-pay | Admitting: Vascular Surgery

## 2016-01-16 ENCOUNTER — Other Ambulatory Visit: Payer: Medicare Other

## 2016-01-17 ENCOUNTER — Encounter
Admission: RE | Admit: 2016-01-17 | Discharge: 2016-01-17 | Disposition: A | Discharge status: Home / Self Care | Payer: Medicare Other | Source: Ambulatory Visit | Attending: Vascular Surgery | Admitting: Vascular Surgery

## 2016-01-17 DIAGNOSIS — N2581 Secondary hyperparathyroidism of renal origin: Secondary | ICD-10-CM | POA: Diagnosis not present

## 2016-01-17 DIAGNOSIS — Z992 Dependence on renal dialysis: Secondary | ICD-10-CM | POA: Diagnosis not present

## 2016-01-17 DIAGNOSIS — I120 Hypertensive chronic kidney disease with stage 5 chronic kidney disease or end stage renal disease: Secondary | ICD-10-CM | POA: Diagnosis not present

## 2016-01-17 DIAGNOSIS — N186 End stage renal disease: Secondary | ICD-10-CM | POA: Diagnosis present

## 2016-01-17 DIAGNOSIS — E875 Hyperkalemia: Secondary | ICD-10-CM | POA: Diagnosis not present

## 2016-01-17 HISTORY — DX: Acute myocardial infarction, unspecified: I21.9

## 2016-01-17 HISTORY — DX: Atherosclerotic heart disease of native coronary artery without angina pectoris: I25.10

## 2016-01-17 HISTORY — DX: Headache, unspecified: R51.9

## 2016-01-17 HISTORY — DX: Headache: R51

## 2016-01-17 HISTORY — DX: Nicotine dependence, unspecified, uncomplicated: F17.200

## 2016-01-17 LAB — CBC WITH DIFFERENTIAL/PLATELET
BASOS PCT: 1 %
Basophils Absolute: 0.1 10*3/uL (ref 0–0.1)
EOS ABS: 0.3 10*3/uL (ref 0–0.7)
Eosinophils Relative: 5 %
HCT: 37.4 % — ABNORMAL LOW (ref 40.0–52.0)
HEMOGLOBIN: 12.6 g/dL — AB (ref 13.0–18.0)
LYMPHS ABS: 1.4 10*3/uL (ref 1.0–3.6)
Lymphocytes Relative: 27 %
MCH: 32.5 pg (ref 26.0–34.0)
MCHC: 33.6 g/dL (ref 32.0–36.0)
MCV: 96.7 fL (ref 80.0–100.0)
MONO ABS: 0.9 10*3/uL (ref 0.2–1.0)
MONOS PCT: 17 %
NEUTROS PCT: 50 %
Neutro Abs: 2.7 10*3/uL (ref 1.4–6.5)
Platelets: 134 10*3/uL — ABNORMAL LOW (ref 150–440)
RBC: 3.87 MIL/uL — ABNORMAL LOW (ref 4.40–5.90)
RDW: 15.1 % — AB (ref 11.5–14.5)
WBC: 5.4 10*3/uL (ref 3.8–10.6)

## 2016-01-17 LAB — BASIC METABOLIC PANEL
Anion gap: 11 (ref 5–15)
BUN: 55 mg/dL — AB (ref 6–20)
CALCIUM: 9.1 mg/dL (ref 8.9–10.3)
CHLORIDE: 95 mmol/L — AB (ref 101–111)
CO2: 27 mmol/L (ref 22–32)
CREATININE: 5.58 mg/dL — AB (ref 0.61–1.24)
GFR calc non Af Amer: 10 mL/min — ABNORMAL LOW (ref >60)
GFR, EST AFRICAN AMERICAN: 11 mL/min — AB (ref >60)
GLUCOSE: 87 mg/dL (ref 65–99)
Potassium: 5.5 mmol/L — ABNORMAL HIGH (ref 3.5–5.1)
Sodium: 133 mmol/L — ABNORMAL LOW (ref 135–145)

## 2016-01-17 LAB — TYPE AND SCREEN
ABO/RH(D): B POS
ANTIBODY SCREEN: NEGATIVE

## 2016-01-17 LAB — PROTIME-INR
INR: 1.22
Prothrombin Time: 15.5 seconds — ABNORMAL HIGH (ref 11.4–15.2)

## 2016-01-17 LAB — APTT: aPTT: 34 seconds (ref 24–36)

## 2016-01-17 NOTE — Pre-Procedure Instructions (Signed)
EKG done on December 26, 2015, and EKG from January 12, 2015 called to Dr. Andree Elk and stated "we should be ok".

## 2016-01-17 NOTE — Patient Instructions (Signed)
  Your procedure is scheduled on: January 18, 2016 (Thursday) Report to Same Day Surgery 2nd floor Medical Mall To find out your arrival time please call [phone] between 1PM - 3PM on ARRIVAL TIME 2:00 PM  Remember: Instructions that are not followed completely may result in serious medical risk, up to and including death, or upon the discretion of your surgeon and anesthesiologist your surgery may need to be rescheduled.    _x___ 1. Do not eat food or drink liquids after midnight. No gum chewing or hard candies.     _x_ 2. No Alcohol for 24 hours before or after surgery.   _x__3. No Smoking for 24 prior to surgery.   ____  4. Bring all medications with you on the day of surgery if instructed.    __x__ 5. Notify your doctor if there is any change in your medical condition     (cold, fever, infections).     Do not wear jewelry, make-up, hairpins, clips or nail polish.  Do not wear lotions, powders, or perfumes. You may wear deodorant.  Do not shave 48 hours prior to surgery. Men may shave face and neck.  Do not bring valuables to the hospital.    [facility] is not responsible for any belongings or valuables.               Contacts, dentures or bridgework may not be worn into surgery.  Leave your suitcase in the car. After surgery it may be brought to your room.  For patients admitted to the hospital, discharge time is determined by your treatment team.   Patients discharged the day of surgery will not be allowed to drive home.    Please read over the following fact sheets that you were given:   [facility] Preparing for Surgery and or MRSA Information   _x___ Take these medicines the morning of surgery with A SIP OF WATER:    1. METOPROLOL  2. PANTOPRAZOLE (PANTOPRAZOLE AT BEDTIME TONIGHT)  3.  4.  5.  6.  ____ Fleet Enema (as directed)   _x___ Use CHG Soap or sage wipes as directed on instruction sheet   _x___ Use inhalers on the day of surgery and bring to hospital  day of surgery (USE ALBUTEROL, SYMBICORT, DUONEB, AND SPIRIVA INHALER THE MORNING OF SURGERY)  ____ Stop metformin 2 days prior to surgery    ____ Take 1/2 of usual insulin dose the night before surgery and none on the morning of surgery.          __x__ Stop aspirin or coumadin, or plavix (NO ASPIRIN TOMORROW MORNING)  _x__ Stop Anti-inflammatories such as Advil, Aleve, Ibuprofen, Motrin, Naproxen,          Naprosyn, Goodies powders or aspirin products. Ok to take Tylenol.   ____ Stop supplements until after surgery.    ____ Bring C-Pap to the hospital.

## 2016-01-18 ENCOUNTER — Ambulatory Visit
Admission: RE | Admit: 2016-01-18 | Discharge: 2016-01-18 | Disposition: A | Discharge status: Skilled Nursing Facility | Payer: Medicare Other | Source: Ambulatory Visit | Attending: Internal Medicine | Admitting: Internal Medicine

## 2016-01-18 ENCOUNTER — Encounter: Payer: Self-pay | Admitting: Anesthesiology

## 2016-01-18 ENCOUNTER — Encounter
Admission: RE | Disposition: A | Discharge status: Skilled Nursing Facility | Payer: Self-pay | Source: Ambulatory Visit | Attending: Vascular Surgery

## 2016-01-18 ENCOUNTER — Encounter: Payer: Self-pay | Admitting: *Deleted

## 2016-01-18 DIAGNOSIS — N2581 Secondary hyperparathyroidism of renal origin: Secondary | ICD-10-CM | POA: Insufficient documentation

## 2016-01-18 DIAGNOSIS — Z992 Dependence on renal dialysis: Secondary | ICD-10-CM | POA: Insufficient documentation

## 2016-01-18 DIAGNOSIS — I120 Hypertensive chronic kidney disease with stage 5 chronic kidney disease or end stage renal disease: Secondary | ICD-10-CM | POA: Diagnosis not present

## 2016-01-18 DIAGNOSIS — N186 End stage renal disease: Secondary | ICD-10-CM | POA: Insufficient documentation

## 2016-01-18 DIAGNOSIS — E875 Hyperkalemia: Secondary | ICD-10-CM | POA: Diagnosis present

## 2016-01-18 LAB — POCT I-STAT 4, (NA,K, GLUC, HGB,HCT)
GLUCOSE: 86 mg/dL (ref 65–99)
Glucose, Bld: 87 mg/dL (ref 65–99)
HCT: 41 % (ref 39.0–52.0)
HEMATOCRIT: 39 % (ref 39.0–52.0)
HEMOGLOBIN: 13.3 g/dL (ref 13.0–17.0)
Hemoglobin: 13.9 g/dL (ref 13.0–17.0)
Potassium: 6 mmol/L — ABNORMAL HIGH (ref 3.5–5.1)
Potassium: 6.7 mmol/L (ref 3.5–5.1)
SODIUM: 132 mmol/L — AB (ref 135–145)
Sodium: 133 mmol/L — ABNORMAL LOW (ref 135–145)

## 2016-01-18 LAB — POTASSIUM: POTASSIUM: 6.9 mmol/L — AB (ref 3.5–5.1)

## 2016-01-18 LAB — SURGICAL PCR SCREEN
MRSA, PCR: POSITIVE — AB
STAPHYLOCOCCUS AUREUS: POSITIVE — AB

## 2016-01-18 SURGERY — REVISON OF ARTERIOVENOUS FISTULA
Anesthesia: General | Laterality: Right

## 2016-01-18 MED ORDER — SODIUM POLYSTYRENE SULFONATE 15 GM/60ML PO SUSP
60.0000 g | Freq: Once | ORAL | Status: AC
  Administered 2016-01-18: 60 g via ORAL
  Filled 2016-01-18: qty 240

## 2016-01-18 MED ORDER — CEFAZOLIN SODIUM-DEXTROSE 2-4 GM/100ML-% IV SOLN
INTRAVENOUS | Status: AC
  Filled 2016-01-18: qty 100

## 2016-01-18 MED ORDER — SODIUM CHLORIDE 0.9 % IV SOLN
INTRAVENOUS | Status: DC
  Administered 2016-01-18: 14:00:00 via INTRAVENOUS

## 2016-01-18 MED ORDER — CEFAZOLIN SODIUM-DEXTROSE 2-4 GM/100ML-% IV SOLN: 2.0000 g | INTRAVENOUS | Status: DC

## 2016-01-18 MED ORDER — CHLORHEXIDINE GLUCONATE CLOTH 2 % EX PADS: 6.0000 | MEDICATED_PAD | Freq: Once | CUTANEOUS | Status: DC

## 2016-01-18 SURGICAL SUPPLY — 49 items
BAG DECANTER FOR FLEXI CONT (MISCELLANEOUS) IMPLANT
BLADE SURG SZ11 CARB STEEL (BLADE) IMPLANT
BOOT SUTURE AID YELLOW STND (SUTURE) IMPLANT
BRUSH SCRUB 4% CHG (MISCELLANEOUS) IMPLANT
CANISTER SUCT 1200ML W/VALVE (MISCELLANEOUS) IMPLANT
CHLORAPREP W/TINT 26ML (MISCELLANEOUS) IMPLANT
CLIP SPRNG 6MM S-JAW DBL (CLIP)
ELECT CAUTERY BLADE 6.4 (BLADE) IMPLANT
ELECT REM PT RETURN 9FT ADLT (ELECTROSURGICAL)
ELECTRODE REM PT RTRN 9FT ADLT (ELECTROSURGICAL) IMPLANT
GEL ULTRASOUND 20GR AQUASONIC (MISCELLANEOUS) IMPLANT
GLOVE BIO SURGEON STRL SZ7 (GLOVE) IMPLANT
GLOVE INDICATOR 7.5 STRL GRN (GLOVE) IMPLANT
GOWN STRL REUS W/ TWL LRG LVL3 (GOWN DISPOSABLE) IMPLANT
GOWN STRL REUS W/ TWL XL LVL3 (GOWN DISPOSABLE) IMPLANT
GOWN STRL REUS W/TWL LRG LVL3 (GOWN DISPOSABLE)
GOWN STRL REUS W/TWL XL LVL3 (GOWN DISPOSABLE)
HEMOSTAT SURGICEL 2X3 (HEMOSTASIS) IMPLANT
IV NS 500ML (IV SOLUTION)
IV NS 500ML BAXH (IV SOLUTION) IMPLANT
KIT RM TURNOVER STRD PROC AR (KITS) IMPLANT
LABEL OR SOLS (LABEL) IMPLANT
LIQUID BAND (GAUZE/BANDAGES/DRESSINGS) IMPLANT
LOOP RED MAXI  1X406MM (MISCELLANEOUS)
LOOP VESSEL MAXI 1X406 RED (MISCELLANEOUS) IMPLANT
LOOP VESSEL MINI 0.8X406 BLUE (MISCELLANEOUS) IMPLANT
LOOPS BLUE MINI 0.8X406MM (MISCELLANEOUS)
NEEDLE FILTER BLUNT 18X 1/2SAF (NEEDLE)
NEEDLE FILTER BLUNT 18X1 1/2 (NEEDLE) IMPLANT
NEEDLE HYPO 30X.5 LL (NEEDLE) IMPLANT
NS IRRIG 500ML POUR BTL (IV SOLUTION) IMPLANT
PACK EXTREMITY ARMC (MISCELLANEOUS) IMPLANT
PAD PREP 24X41 OB/GYN DISP (PERSONAL CARE ITEMS) IMPLANT
SOLUTION CELL SAVER (CLIP) IMPLANT
STOCKINETTE STRL 4IN 9604848 (GAUZE/BANDAGES/DRESSINGS) IMPLANT
SUT MNCRL AB 4-0 PS2 18 (SUTURE) IMPLANT
SUT PROLENE 6 0 BV (SUTURE) IMPLANT
SUT SILK 2 0 (SUTURE)
SUT SILK 2-0 18XBRD TIE 12 (SUTURE) IMPLANT
SUT SILK 3 0 (SUTURE)
SUT SILK 3-0 18XBRD TIE 12 (SUTURE) IMPLANT
SUT SILK 4 0 (SUTURE)
SUT SILK 4-0 18XBRD TIE 12 (SUTURE) IMPLANT
SUT VIC AB 3-0 SH 27 (SUTURE)
SUT VIC AB 3-0 SH 27X BRD (SUTURE) IMPLANT
SYR 20CC LL (SYRINGE) IMPLANT
SYR 3ML LL SCALE MARK (SYRINGE) IMPLANT
SYR TB 1ML 27GX1/2 LL (SYRINGE) IMPLANT
TOWEL OR 17X26 4PK STRL BLUE (TOWEL DISPOSABLE) IMPLANT

## 2016-01-18 NOTE — Progress Notes (Signed)
Lab resulted at 6.9 potassium. Dr. Kayleen Memos notified. Will discuss with Dr. Lucky Cowboy.

## 2016-01-18 NOTE — Anesthesia Preprocedure Evaluation (Deleted)
Anesthesia Evaluation  Patient identified by MRN, date of birth, ID band Patient awake  General Assessment Comment:Hx of hypotension with some surgeries  Reviewed: Allergy & Precautions, NPO status , Patient's Chart, lab work & pertinent test results, reviewed documented beta blocker date and time   History of Anesthesia Complications (+) history of anesthetic complications  Airway Mallampati: III  TM Distance: >3 FB     Dental  (+) Poor Dentition, Missing, Chipped   Pulmonary shortness of breath and with exertion, asthma , sleep apnea , COPD,  COPD inhaler, Current Smoker,    Pulmonary exam normal        Cardiovascular hypertension, Pt. on medications and Pt. on home beta blockers + CAD, + Past MI, + Peripheral Vascular Disease and +CHF  Normal cardiovascular exam     Neuro/Psych  Headaches,    GI/Hepatic GERD  Medicated and Controlled,  Endo/Other  diabetes, Well Controlled, Type 2, Oral Hypoglycemic Agents  Renal/GU ESRF and DialysisRenal disease  negative genitourinary   Musculoskeletal  (+) Arthritis , Osteoarthritis,    Abdominal Normal abdominal exam  (+)   Peds negative pediatric ROS (+)  Hematology  (+) anemia ,   Anesthesia Other Findings Hx of LBBB  Reproductive/Obstetrics                           Anesthesia Physical Anesthesia Plan  ASA: IV  Anesthesia Plan: General   Post-op Pain Management:    Induction: Intravenous  Airway Management Planned: Oral ETT and LMA  Additional Equipment:   Intra-op Plan:   Post-operative Plan: Extubation in OR  Informed Consent: I have reviewed the patients History and Physical, chart, labs and discussed the procedure including the risks, benefits and alternatives for the proposed anesthesia with the patient or authorized representative who has indicated his/her understanding and acceptance.   Dental advisory given  Plan Discussed  with: CRNA and Surgeon  Anesthesia Plan Comments:        Anesthesia Quick Evaluation                                   Anesthesia Evaluation  Patient identified by MRN, date of birth, ID band Patient awake    Reviewed: Allergy & Precautions, NPO status , Patient's Chart, lab work & pertinent test results  Airway Mallampati: II       Dental  (+) Poor Dentition, Chipped, Edentulous Upper   Pulmonary sleep apnea , COPD,  COPD inhaler, Current Smoker,    + rhonchi        Cardiovascular hypertension, Pt. on medications and Pt. on home beta blockers + CAD and +CHF   Rhythm:Regular Rate:Normal     Neuro/Psych    GI/Hepatic GERD  ,  Endo/Other  diabetes, Type 1, Insulin Dependent  Renal/GU CRF and DialysisRenal disease     Musculoskeletal   Abdominal Normal abdominal exam  (+)   Peds  Hematology  (+) anemia ,   Anesthesia Other Findings   Reproductive/Obstetrics                             Anesthesia Physical Anesthesia Plan  ASA: III  Anesthesia Plan: General   Post-op Pain Management:    Induction: Intravenous  Airway Management Planned: LMA  Additional Equipment:   Intra-op Plan:   Post-operative Plan: Extubation in OR  Informed Consent: I have reviewed the patients History and Physical, chart, labs and discussed the procedure including the risks, benefits and alternatives for the proposed anesthesia with the patient or authorized representative who has indicated his/her understanding and acceptance.     Plan Discussed with: CRNA  Anesthesia Plan Comments:         Anesthesia Quick Evaluation                                   Anesthesia Evaluation  Patient identified by MRN, date of birth, ID band Patient awake    Reviewed: Allergy & Precautions, NPO status , Patient's Chart, lab work & pertinent test results  Airway Mallampati: II       Dental  (+) Poor Dentition, Chipped,  Edentulous Upper   Pulmonary sleep apnea , COPD,  COPD inhaler, Current Smoker,    + rhonchi        Cardiovascular hypertension, Pt. on medications and Pt. on home beta blockers + CAD and +CHF   Rhythm:Regular Rate:Normal     Neuro/Psych    GI/Hepatic GERD  ,  Endo/Other  diabetes, Type 1, Insulin Dependent  Renal/GU CRF and DialysisRenal disease     Musculoskeletal   Abdominal Normal abdominal exam  (+)   Peds  Hematology  (+) anemia ,   Anesthesia Other Findings   Reproductive/Obstetrics                             Anesthesia Physical Anesthesia Plan  ASA: III  Anesthesia Plan: General   Post-op Pain Management:    Induction: Intravenous  Airway Management Planned: LMA  Additional Equipment:   Intra-op Plan:   Post-operative Plan: Extubation in OR  Informed Consent: I have reviewed the patients History and Physical, chart, labs and discussed the procedure including the risks, benefits and alternatives for the proposed anesthesia with the patient or authorized representative who has indicated his/her understanding and acceptance.     Plan Discussed with: CRNA  Anesthesia Plan Comments:         Anesthesia Quick Evaluation                                   Anesthesia Evaluation  Patient identified by MRN, date of birth, ID band Patient awake    Reviewed: Allergy & Precautions, H&P , NPO status , Patient's Chart, lab work & pertinent test results, reviewed documented beta blocker date and time   History of Anesthesia Complications Negative for: history of anesthetic complications  Airway Mallampati: II  TM Distance: >3 FB Neck ROM: full    Dental no notable dental hx. (+) Poor Dentition   Pulmonary neg shortness of breath, asthma , neg sleep apnea, COPD COPD inhaler, neg recent URI, former smoker,  breath sounds clear to auscultation  Pulmonary exam normal       Cardiovascular Exercise  Tolerance: Good hypertension, - angina+ CAD, + Past MI, + Cardiac Stents and +CHF - CABG negative cardio ROS Normal cardiovascular exam- Valvular Problems/MurmursRhythm:regular Rate:Normal     Neuro/Psych negative neurological ROS  negative psych ROS   GI/Hepatic negative GI ROS, Neg liver ROS,   Endo/Other  diabetes, Well Controlled  Renal/GU ESRF and DialysisRenal disease  negative genitourinary   Musculoskeletal   Abdominal   Peds  Hematology  (+)  Blood dyscrasia, anemia ,   Anesthesia Other Findings Past Medical History:   End stage renal disease                                      HTN (hypertension)                                           HLD (hyperlipidemia)                                         Anemia                                                       Hyperparathyroidism                                          Diabetes mellitus without complication                       Dialysis patient                                               Comment:Mon, Wed, Fri   CHF (congestive heart failure)                               Asthma                                                       COPD (chronic obstructive pulmonary disease)                 Complication of anesthesia                                     Comment:hypotension   Reproductive/Obstetrics negative OB ROS                             Anesthesia Physical Anesthesia Plan  ASA: IV  Anesthesia Plan: General   Post-op Pain Management:    Induction:   Airway Management Planned:   Additional Equipment:   Intra-op Plan:   Post-operative Plan:   Informed Consent: I have reviewed the patients History and Physical, chart, labs and discussed the procedure including the risks, benefits and alternatives for the proposed anesthesia with the patient or authorized representative who has indicated his/her understanding and acceptance.   Dental Advisory Given  Plan Discussed  with: Anesthesiologist, CRNA and Surgeon  Anesthesia Plan Comments:  Anesthesia Quick Evaluation  

## 2016-01-18 NOTE — H&P (Signed)
  [facility] History & Physical Update  The patient was interviewed and re-examined.  The patient's previous History and Physical has been reviewed and is unchanged.  There is no change in the plan of care. We plan to proceed with the scheduled procedure.  [name],[name], MD  01/18/2016, 2:43 PM

## 2016-01-18 NOTE — Progress Notes (Signed)
[facility]  ROUNDING NOTE   Subjective:   Scheduled for an elective ligation of anneurysm of his AVF by Dr. Lucky Cowboy. However patient found to have a potassium of 6.7. Nephrology was consulted.   Patient states he has been eating high potassium containing foods. Including potato chips, orange juice and protein shakes. Wife is at bedside.   Patient with no EKG changes.   Objective:  Vital signs in last 24 hours:  Temp:  [97.7 F (36.5 C)] 97.7 F (36.5 C) (08/24 1347) Pulse Rate:  [64] 64 (08/24 1347) Resp:  [16] 16 (08/24 1347) BP: (104)/(71) 104/71 (08/24 1347) SpO2:  [97 %] 97 % (08/24 1347) Weight:  [80.3 kg (177 lb)] 80.3 kg (177 lb) (08/24 1347)  Weight change:  Filed Weights   01/18/16 1347  Weight: 80.3 kg (177 lb)    Intake/Output: No intake/output data recorded.   Intake/Output this shift:  No intake/output data recorded.  Physical Exam: General: NAD, sitting up in bed  Head: Normocephalic, atraumatic. Moist oral mucosal membranes  Eyes: Anicteric, PERRL  Neck: Supple, trachea midline  Lungs:  Clear to auscultation  Heart: Regular rate and rhythm  Abdomen:  Soft, nontender,   Extremities: no peripheral edema.  Neurologic: Nonfocal, moving all four extremities  Skin: No lesions  Access: Right arm AVF, +anneurysm    Basic Metabolic Panel:  Recent Labs Lab 01/17/16 1455 01/18/16 1408 01/18/16 1510 01/18/16 1519  NA 133* 133*  --  132*  K 5.5* 6.0* 6.9* 6.7*  CL 95*  --   --   --   CO2 27  --   --   --   GLUCOSE 87 87  --  86  BUN 55*  --   --   --   CREATININE 5.58*  --   --   --   CALCIUM 9.1  --   --   --     Liver Function Tests: No results for input(s): AST, ALT, ALKPHOS, BILITOT, PROT, ALBUMIN in the last 168 hours. No results for input(s): LIPASE, AMYLASE in the last 168 hours. No results for input(s): AMMONIA in the last 168 hours.  CBC:  Recent Labs Lab 01/17/16 1455 01/18/16 1408 01/18/16 1519  WBC 5.4  --   --    NEUTROABS 2.7  --   --   HGB 12.6* 13.9 13.3  HCT 37.4* 41.0 39.0  MCV 96.7  --   --   PLT 134*  --   --     Cardiac Enzymes: No results for input(s): CKTOTAL, CKMB, CKMBINDEX, TROPONINI in the last 168 hours.  BNP: Invalid input(s): POCBNP  CBG: No results for input(s): GLUCAP in the last 168 hours.  Microbiology: Results for orders placed or performed during the hospital encounter of 01/17/16  Surgical pcr screen     Status: Abnormal   Collection Time: 01/17/16  2:55 PM  Result Value Ref Range Status   MRSA, PCR POSITIVE (A) NEGATIVE Final    Comment: RESULT CALLED TO, READ BACK BY AND VERIFIED WITH: KAREN DICKSON AT 1700 ON 01/17/16...MSS    Staphylococcus aureus POSITIVE (A) NEGATIVE Final    Comment:        The Xpert SA Assay (FDA approved for NASAL specimens in patients over 45 years of age), is one component of a comprehensive surveillance program.  Test performance has been validated by [facility] for patients greater than or equal to 65 year old. It is not intended to diagnose infection nor  to guide or monitor treatment. CRITICAL RESULT CALLED TO, READ BACK BY AND VERIFIED WITH: Lucina Mellow RN AT 1700 01/17/16 MSS     Coagulation Studies:  Recent Labs  01/17/16 1455  LABPROT 15.5*  INR 1.22    Urinalysis: No results for input(s): COLORURINE, LABSPEC, PHURINE, GLUCOSEU, HGBUR, BILIRUBINUR, KETONESUR, PROTEINUR, UROBILINOGEN, NITRITE, LEUKOCYTESUR in the last 72 hours.  Invalid input(s): APPERANCEUR    Imaging: No results found.   Medications:   . sodium chloride 50 mL/hr at 01/18/16 1413   . ceFAZolin      .  ceFAZolin (ANCEF) IV  2 g Intravenous On Call to OR  . Chlorhexidine Gluconate Cloth  6 each Topical Once   And  . Chlorhexidine Gluconate Cloth  6 each Topical Once  . sodium polystyrene  60 g Oral Once     Assessment/ Plan:  [name] is a 60 y.o. black male with ESRD on hemodialysis, hypertension,  hyperlipidemia, anemia, COPD, peripheral vascular disease, left BKA. Consulted for hyperkalemia  MWF [facility].   1. Hyperkalemia: with end stage renal disease. No EKG changes. Hemodynamically stable. Patient is scheduled for his regularly scheduled outpatient hemodialysis tomorrow morning.  - Kayexalate PO x 1 - patient to follow up with his outpatient dialysis.   2. End Stage Renal Disease: with access complication: Vascular surgery will be unable to reschedule patient for tomorrow. Therefore he will have to have his access procedure rescheduled.   3. Hypertension: at goal.   4. Secondary Hyperparathyroidism: - calcium acetate with meals.    LOS: 0 [name] 8/24/20174:55 PM

## 2016-01-18 NOTE — Progress Notes (Signed)
Dr. Mackey Birchwood in for eval. Kayexalate ordered see mar , okay to discharge home per MD.

## 2016-01-18 NOTE — Anesthesia Preprocedure Evaluation (Deleted)
Anesthesia Evaluation  Patient identified by MRN, date of birth, ID band Patient awake    Reviewed: Allergy & Precautions, H&P , NPO status , Patient's Chart, lab work & pertinent test results, reviewed documented beta blocker date and time   History of Anesthesia Complications Negative for: history of anesthetic complications  Airway Mallampati: II  TM Distance: >3 FB Neck ROM: full    Dental no notable dental hx. (+) Poor Dentition   Pulmonary neg shortness of breath, asthma , neg sleep apnea, COPD,  COPD inhaler, neg recent URI, Current Smoker, former smoker,    Pulmonary exam normal breath sounds clear to auscultation       Cardiovascular Exercise Tolerance: Good hypertension, (-) angina+ CAD, + Past MI, + Cardiac Stents and +CHF  (-) CABG negative cardio ROS Normal cardiovascular exam(-) Valvular Problems/Murmurs Rhythm:regular Rate:Normal     Neuro/Psych negative neurological ROS  negative psych ROS   GI/Hepatic negative GI ROS, Neg liver ROS,   Endo/Other  diabetes, Well Controlled  Renal/GU ESRF and DialysisRenal disease  negative genitourinary   Musculoskeletal   Abdominal   Peds  Hematology  (+) Blood dyscrasia, anemia ,   Anesthesia Other Findings Past Medical History:   End stage renal disease                                      HTN (hypertension)                                           HLD (hyperlipidemia)                                         Anemia                                                       Hyperparathyroidism                                          Diabetes mellitus without complication                       Dialysis patient                                               Comment:Mon, Wed, Fri   CHF (congestive heart failure)                               Asthma  COPD (chronic obstructive pulmonary disease)                Complication of anesthesia                                     Comment:hypotension   Reproductive/Obstetrics negative OB ROS                             Anesthesia Physical  Anesthesia Plan  ASA: IV  Anesthesia Plan: General   Post-op Pain Management:    Induction:   Airway Management Planned: LMA  Additional Equipment:   Intra-op Plan:   Post-operative Plan:   Informed Consent: I have reviewed the patients History and Physical, chart, labs and discussed the procedure including the risks, benefits and alternatives for the proposed anesthesia with the patient or authorized representative who has indicated his/her understanding and acceptance.   Dental Advisory Given  Plan Discussed with: Anesthesiologist, CRNA and Surgeon  Anesthesia Plan Comments:         Anesthesia Quick Evaluation

## 2016-01-18 NOTE — Progress Notes (Signed)
Dr. Lucky Cowboy aware of elevated potassium. Order to notify the hospitalist to evaluate possible hospital admission.

## 2016-01-18 NOTE — Progress Notes (Signed)
Spoke with Dr. Lucky Cowboy , verbal order received to discharge patient home , due surgery being cancelled . EPIC system would not allow me to insert a discharge order , called admitting in [place] to discharge

## 2016-01-18 NOTE — Progress Notes (Signed)
POCT potassium level was 6.0; Dr. Kayleen Memos, anesthesiologist made aware. Order for lab draw potassium. Lab drawn by lab tech. Resulted 7.2. Dr. Kayleen Memos made aware of this result. Order to redraw and recheck potassium.  IV fluids of NS was shut off for at least 10 minutes prior to redraw from the lab tech.

## 2016-01-31 ENCOUNTER — Ambulatory Visit: Payer: Medicare Other | Admitting: Anesthesiology

## 2016-01-31 ENCOUNTER — Encounter: Payer: Self-pay | Admitting: *Deleted

## 2016-01-31 ENCOUNTER — Encounter
Admission: RE | Disposition: A | Discharge status: Skilled Nursing Facility | Payer: Self-pay | Source: Ambulatory Visit | Attending: Vascular Surgery

## 2016-01-31 ENCOUNTER — Ambulatory Visit
Admission: RE | Admit: 2016-01-31 | Discharge: 2016-01-31 | Disposition: A | Discharge status: Skilled Nursing Facility | Payer: Medicare Other | Source: Ambulatory Visit | Attending: Vascular Surgery | Admitting: Vascular Surgery

## 2016-01-31 DIAGNOSIS — J449 Chronic obstructive pulmonary disease, unspecified: Secondary | ICD-10-CM | POA: Diagnosis not present

## 2016-01-31 DIAGNOSIS — E785 Hyperlipidemia, unspecified: Secondary | ICD-10-CM | POA: Diagnosis not present

## 2016-01-31 DIAGNOSIS — I728 Aneurysm of other specified arteries: Secondary | ICD-10-CM | POA: Diagnosis not present

## 2016-01-31 DIAGNOSIS — Z7982 Long term (current) use of aspirin: Secondary | ICD-10-CM | POA: Insufficient documentation

## 2016-01-31 DIAGNOSIS — I132 Hypertensive heart and chronic kidney disease with heart failure and with stage 5 chronic kidney disease, or end stage renal disease: Secondary | ICD-10-CM | POA: Insufficient documentation

## 2016-01-31 DIAGNOSIS — M1990 Unspecified osteoarthritis, unspecified site: Secondary | ICD-10-CM | POA: Insufficient documentation

## 2016-01-31 DIAGNOSIS — Z89512 Acquired absence of left leg below knee: Secondary | ICD-10-CM | POA: Insufficient documentation

## 2016-01-31 DIAGNOSIS — G4730 Sleep apnea, unspecified: Secondary | ICD-10-CM | POA: Diagnosis not present

## 2016-01-31 DIAGNOSIS — Z992 Dependence on renal dialysis: Secondary | ICD-10-CM | POA: Diagnosis not present

## 2016-01-31 DIAGNOSIS — Z89431 Acquired absence of right foot: Secondary | ICD-10-CM | POA: Insufficient documentation

## 2016-01-31 DIAGNOSIS — E876 Hypokalemia: Secondary | ICD-10-CM | POA: Insufficient documentation

## 2016-01-31 DIAGNOSIS — I2510 Atherosclerotic heart disease of native coronary artery without angina pectoris: Secondary | ICD-10-CM | POA: Diagnosis not present

## 2016-01-31 DIAGNOSIS — K219 Gastro-esophageal reflux disease without esophagitis: Secondary | ICD-10-CM | POA: Diagnosis not present

## 2016-01-31 DIAGNOSIS — T82898D Other specified complication of vascular prosthetic devices, implants and grafts, subsequent encounter: Secondary | ICD-10-CM | POA: Diagnosis not present

## 2016-01-31 DIAGNOSIS — X58XXXD Exposure to other specified factors, subsequent encounter: Secondary | ICD-10-CM | POA: Diagnosis not present

## 2016-01-31 DIAGNOSIS — F17210 Nicotine dependence, cigarettes, uncomplicated: Secondary | ICD-10-CM | POA: Diagnosis not present

## 2016-01-31 DIAGNOSIS — Z79899 Other long term (current) drug therapy: Secondary | ICD-10-CM | POA: Diagnosis not present

## 2016-01-31 DIAGNOSIS — I252 Old myocardial infarction: Secondary | ICD-10-CM | POA: Insufficient documentation

## 2016-01-31 DIAGNOSIS — I5022 Chronic systolic (congestive) heart failure: Secondary | ICD-10-CM | POA: Diagnosis not present

## 2016-01-31 DIAGNOSIS — E7800 Pure hypercholesterolemia, unspecified: Secondary | ICD-10-CM | POA: Insufficient documentation

## 2016-01-31 DIAGNOSIS — I739 Peripheral vascular disease, unspecified: Secondary | ICD-10-CM | POA: Insufficient documentation

## 2016-01-31 DIAGNOSIS — E1122 Type 2 diabetes mellitus with diabetic chronic kidney disease: Secondary | ICD-10-CM | POA: Diagnosis not present

## 2016-01-31 DIAGNOSIS — N186 End stage renal disease: Secondary | ICD-10-CM | POA: Diagnosis present

## 2016-01-31 DIAGNOSIS — Z794 Long term (current) use of insulin: Secondary | ICD-10-CM | POA: Diagnosis not present

## 2016-01-31 HISTORY — PX: LIGATION OF ARTERIOVENOUS  FISTULA: SHX5948

## 2016-01-31 HISTORY — PX: REVISON OF ARTERIOVENOUS FISTULA: SHX6074

## 2016-01-31 LAB — POCT I-STAT 4, (NA,K, GLUC, HGB,HCT)
Glucose, Bld: 83 mg/dL (ref 65–99)
HEMATOCRIT: 38 % — AB (ref 39.0–52.0)
Hemoglobin: 12.9 g/dL — ABNORMAL LOW (ref 13.0–17.0)
Potassium: 5.2 mmol/L — ABNORMAL HIGH (ref 3.5–5.1)
SODIUM: 135 mmol/L (ref 135–145)

## 2016-01-31 LAB — GLUCOSE, CAPILLARY
GLUCOSE-CAPILLARY: 70 mg/dL (ref 65–99)
Glucose-Capillary: 68 mg/dL (ref 65–99)

## 2016-01-31 LAB — TYPE AND SCREEN
ABO/RH(D): B POS
Antibody Screen: NEGATIVE

## 2016-01-31 SURGERY — REVISON OF ARTERIOVENOUS FISTULA
Anesthesia: Monitor Anesthesia Care | Site: Arm Upper | Laterality: Right | Wound class: Dirty or Infected

## 2016-01-31 MED ORDER — EVICEL 2 ML EX KIT
PACK | CUTANEOUS | Status: DC | PRN
  Administered 2016-01-31: 2 mL

## 2016-01-31 MED ORDER — EVICEL 2 ML EX KIT
PACK | CUTANEOUS | Status: AC
  Filled 2016-01-31: qty 1

## 2016-01-31 MED ORDER — HEPARIN SODIUM (PORCINE) 5000 UNIT/ML IJ SOLN
INTRAMUSCULAR | Status: AC
Start: 2016-01-31 — End: 2016-01-31
  Filled 2016-01-31: qty 1

## 2016-01-31 MED ORDER — IPRATROPIUM-ALBUTEROL 0.5-2.5 (3) MG/3ML IN SOLN
RESPIRATORY_TRACT | Status: AC
  Administered 2016-01-31: 3 mL via RESPIRATORY_TRACT
  Filled 2016-01-31: qty 3

## 2016-01-31 MED ORDER — METOPROLOL TARTRATE 25 MG PO TABS
25.0000 mg | ORAL_TABLET | Freq: Once | ORAL | Status: AC
  Administered 2016-01-31: 25 mg via ORAL

## 2016-01-31 MED ORDER — SODIUM CHLORIDE 0.9 % IJ SOLN
INTRAMUSCULAR | Status: AC
  Filled 2016-01-31: qty 20

## 2016-01-31 MED ORDER — FENTANYL CITRATE (PF) 100 MCG/2ML IJ SOLN: 25.0000 ug | INTRAMUSCULAR | Status: DC | PRN

## 2016-01-31 MED ORDER — ROPIVACAINE HCL 5 MG/ML IJ SOLN
INTRAMUSCULAR | Status: DC | PRN
  Administered 2016-01-31: 20 mL via PERINEURAL
  Administered 2016-01-31: 10 mL via PERINEURAL

## 2016-01-31 MED ORDER — CEFAZOLIN SODIUM-DEXTROSE 2-4 GM/100ML-% IV SOLN: 2.0000 g | Freq: Once | INTRAVENOUS | Status: DC

## 2016-01-31 MED ORDER — SODIUM CHLORIDE 0.9 % IJ SOLN
INTRAMUSCULAR | Status: AC
  Filled 2016-01-31: qty 100

## 2016-01-31 MED ORDER — CHLORHEXIDINE GLUCONATE CLOTH 2 % EX PADS: 6.0000 | MEDICATED_PAD | Freq: Once | CUTANEOUS | Status: DC

## 2016-01-31 MED ORDER — BUPIVACAINE-EPINEPHRINE (PF) 0.5% -1:200000 IJ SOLN
INTRAMUSCULAR | Status: AC
  Filled 2016-01-31: qty 30

## 2016-01-31 MED ORDER — HEPARIN SODIUM (PORCINE) 1000 UNIT/ML IJ SOLN
INTRAMUSCULAR | Status: DC | PRN
  Administered 2016-01-31: 3000 [IU] via INTRAVENOUS

## 2016-01-31 MED ORDER — PHENYLEPHRINE HCL 10 MG/ML IJ SOLN
INTRAMUSCULAR | Status: DC | PRN
  Administered 2016-01-31: 200 ug via INTRAVENOUS

## 2016-01-31 MED ORDER — PAPAVERINE HCL 30 MG/ML IJ SOLN
INTRAMUSCULAR | Status: AC
  Filled 2016-01-31: qty 2

## 2016-01-31 MED ORDER — EPHEDRINE SULFATE 50 MG/ML IJ SOLN
INTRAMUSCULAR | Status: DC | PRN
  Administered 2016-01-31 (×2): 20 mg via INTRAVENOUS
  Administered 2016-01-31: 10 mg via INTRAVENOUS

## 2016-01-31 MED ORDER — MIDAZOLAM HCL 5 MG/5ML IJ SOLN
INTRAMUSCULAR | Status: AC
  Filled 2016-01-31: qty 5

## 2016-01-31 MED ORDER — OXYCODONE-ACETAMINOPHEN 7.5-325 MG PO TABS: 1.0000 | ORAL_TABLET | ORAL | 0 refills | Status: DC | PRN

## 2016-01-31 MED ORDER — MIDAZOLAM HCL 5 MG/5ML IJ SOLN
1.0000 mg | Freq: Once | INTRAMUSCULAR | Status: AC
  Administered 2016-01-31: 1 mg via INTRAVENOUS

## 2016-01-31 MED ORDER — ROPIVACAINE HCL 5 MG/ML IJ SOLN
INTRAMUSCULAR | Status: AC
  Filled 2016-01-31: qty 40

## 2016-01-31 MED ORDER — IPRATROPIUM-ALBUTEROL 0.5-2.5 (3) MG/3ML IN SOLN
3.0000 mL | Freq: Once | RESPIRATORY_TRACT | Status: AC
  Administered 2016-01-31: 3 mL via RESPIRATORY_TRACT

## 2016-01-31 MED ORDER — CLINDAMYCIN PHOSPHATE 300 MG/50ML IV SOLN
300.0000 mg | Freq: Once | INTRAVENOUS | Status: AC
  Administered 2016-01-31: 300 mg via INTRAVENOUS

## 2016-01-31 MED ORDER — OXYCODONE HCL 5 MG/5ML PO SOLN: 5.0000 mg | Freq: Once | ORAL | Status: DC | PRN

## 2016-01-31 MED ORDER — MIDAZOLAM HCL 2 MG/2ML IJ SOLN
INTRAMUSCULAR | Status: DC | PRN
  Administered 2016-01-31: 1 mg via INTRAVENOUS

## 2016-01-31 MED ORDER — FENTANYL CITRATE (PF) 100 MCG/2ML IJ SOLN
50.0000 ug | Freq: Once | INTRAMUSCULAR | Status: AC
  Administered 2016-01-31: 50 ug via INTRAVENOUS

## 2016-01-31 MED ORDER — FENTANYL CITRATE (PF) 100 MCG/2ML IJ SOLN
INTRAMUSCULAR | Status: AC
  Filled 2016-01-31: qty 2

## 2016-01-31 MED ORDER — CLINDAMYCIN PHOSPHATE 300 MG/50ML IV SOLN
INTRAVENOUS | Status: AC
  Filled 2016-01-31: qty 50

## 2016-01-31 MED ORDER — HEPARIN SODIUM (PORCINE) 5000 UNIT/ML IJ SOLN
INTRAMUSCULAR | Status: AC
  Filled 2016-01-31: qty 1

## 2016-01-31 MED ORDER — OXYCODONE HCL 5 MG PO TABS: 5.0000 mg | ORAL_TABLET | Freq: Once | ORAL | Status: DC | PRN

## 2016-01-31 MED ORDER — FENTANYL CITRATE (PF) 100 MCG/2ML IJ SOLN
INTRAMUSCULAR | Status: DC | PRN
  Administered 2016-01-31: 50 ug via INTRAVENOUS

## 2016-01-31 MED ORDER — SODIUM CHLORIDE 0.9 % IV SOLN
INTRAVENOUS | Status: DC
  Administered 2016-01-31 (×2): via INTRAVENOUS

## 2016-01-31 MED ORDER — PROPOFOL 500 MG/50ML IV EMUL
INTRAVENOUS | Status: DC | PRN
  Administered 2016-01-31: 50 ug/kg/min via INTRAVENOUS

## 2016-01-31 MED ORDER — SODIUM CHLORIDE 0.9 % IV SOLN
INTRAVENOUS | Status: DC | PRN
  Administered 2016-01-31: 40 mL via INTRAMUSCULAR

## 2016-01-31 MED ORDER — LIDOCAINE HCL (PF) 2 % IJ SOLN
INTRAMUSCULAR | Status: DC | PRN
  Administered 2016-01-31: 3 mL

## 2016-01-31 MED ORDER — METOPROLOL TARTRATE 25 MG PO TABS
ORAL_TABLET | ORAL | Status: AC
  Administered 2016-01-31: 25 mg via ORAL
  Filled 2016-01-31: qty 1

## 2016-01-31 SURGICAL SUPPLY — 57 items
BAG DECANTER FOR FLEXI CONT (MISCELLANEOUS) ×4 IMPLANT
BLADE SURG SZ11 CARB STEEL (BLADE) ×4 IMPLANT
BOOT SUTURE AID YELLOW STND (SUTURE) ×4 IMPLANT
BRUSH SCRUB 4% CHG (MISCELLANEOUS) ×4 IMPLANT
CANISTER SUCT 1200ML W/VALVE (MISCELLANEOUS) ×4 IMPLANT
CHLORAPREP W/TINT 26ML (MISCELLANEOUS) ×4 IMPLANT
CLIP SPRNG 6MM S-JAW DBL (CLIP) ×4
DRSG OPSITE POSTOP 4X6 (GAUZE/BANDAGES/DRESSINGS) ×4 IMPLANT
ELECT CAUTERY BLADE 6.4 (BLADE) ×4 IMPLANT
ELECT REM PT RETURN 9FT ADLT (ELECTROSURGICAL) ×4
ELECTRODE REM PT RTRN 9FT ADLT (ELECTROSURGICAL) ×2 IMPLANT
EVICEL 2ML SEALANT HUMAN (Miscellaneous) ×4 IMPLANT
GEL ULTRASOUND 20GR AQUASONIC (MISCELLANEOUS) IMPLANT
GLOVE BIO SURGEON STRL SZ7 (GLOVE) ×12 IMPLANT
GLOVE INDICATOR 7.5 STRL GRN (GLOVE) ×4 IMPLANT
GOWN STRL REUS W/ TWL LRG LVL3 (GOWN DISPOSABLE) ×2 IMPLANT
GOWN STRL REUS W/ TWL XL LVL3 (GOWN DISPOSABLE) ×2 IMPLANT
GOWN STRL REUS W/TWL LRG LVL3 (GOWN DISPOSABLE) ×2
GOWN STRL REUS W/TWL XL LVL3 (GOWN DISPOSABLE) ×2
GRAFT COLLAGEN VASCULAR 8X40 (Vascular Products) ×4 IMPLANT
HEMOSTAT SURGICEL 2X3 (HEMOSTASIS) ×8 IMPLANT
IV NS 500ML (IV SOLUTION) ×2
IV NS 500ML BAXH (IV SOLUTION) ×2 IMPLANT
KIT RM TURNOVER STRD PROC AR (KITS) ×4 IMPLANT
LABEL OR SOLS (LABEL) ×4 IMPLANT
LIQUID BAND (GAUZE/BANDAGES/DRESSINGS) ×4 IMPLANT
LOOP RED MAXI  1X406MM (MISCELLANEOUS) ×2
LOOP VESSEL MAXI 1X406 RED (MISCELLANEOUS) ×2 IMPLANT
LOOP VESSEL MINI 0.8X406 BLUE (MISCELLANEOUS) ×2 IMPLANT
LOOPS BLUE MINI 0.8X406MM (MISCELLANEOUS) ×2
NEEDLE FILTER BLUNT 18X 1/2SAF (NEEDLE) ×2
NEEDLE FILTER BLUNT 18X1 1/2 (NEEDLE) ×2 IMPLANT
NEEDLE HYPO 30X.5 LL (NEEDLE) IMPLANT
NS IRRIG 500ML POUR BTL (IV SOLUTION) IMPLANT
PACK EXTREMITY ARMC (MISCELLANEOUS) ×4 IMPLANT
PAD PREP 24X41 OB/GYN DISP (PERSONAL CARE ITEMS) ×4 IMPLANT
SOLUTION CELL SAVER (CLIP) ×2 IMPLANT
SPONGE XRAY 4X4 16PLY STRL (MISCELLANEOUS) ×8 IMPLANT
STOCKINETTE STRL 4IN 9604848 (GAUZE/BANDAGES/DRESSINGS) ×4 IMPLANT
SUT ETHILON 3-0 FS-10 30 BLK (SUTURE) ×4
SUT MNCRL AB 4-0 PS2 18 (SUTURE) ×4 IMPLANT
SUT PROLENE 6 0 BV (SUTURE) ×32 IMPLANT
SUT SILK 0 SH 30 (SUTURE) ×4 IMPLANT
SUT SILK 2 0 (SUTURE) ×2
SUT SILK 2-0 18XBRD TIE 12 (SUTURE) ×2 IMPLANT
SUT SILK 3 0 (SUTURE) ×2
SUT SILK 3-0 18XBRD TIE 12 (SUTURE) ×2 IMPLANT
SUT SILK 4 0 (SUTURE) ×2
SUT SILK 4-0 18XBRD TIE 12 (SUTURE) ×2 IMPLANT
SUT VIC AB 2-0 CT2 27 (SUTURE) ×4 IMPLANT
SUT VIC AB 3-0 SH 27 (SUTURE) ×8
SUT VIC AB 3-0 SH 27X BRD (SUTURE) ×8 IMPLANT
SUTURE EHLN 3-0 FS-10 30 BLK (SUTURE) ×2 IMPLANT
SYR 20CC LL (SYRINGE) ×4 IMPLANT
SYR 3ML LL SCALE MARK (SYRINGE) ×4 IMPLANT
SYR TB 1ML 27GX1/2 LL (SYRINGE) IMPLANT
TOWEL OR 17X26 4PK STRL BLUE (TOWEL DISPOSABLE) IMPLANT

## 2016-01-31 NOTE — Discharge Instructions (Signed)
AV Fistula, Care After °Refer to this sheet in the next few weeks. These instructions provide you with information on caring for yourself after your procedure. Your caregiver may also give you more specific instructions. Your treatment has been planned according to current medical practices, but problems sometimes occur. Call your caregiver if you have any problems or questions after your procedure. °HOME CARE INSTRUCTIONS  °· Do not drive a car or take public transportation alone. °· Do not drink alcohol. °· Only take medicine that has been prescribed by your caregiver. °· Do not sign important papers or make important decisions. °· Have a responsible person with you. °· Ask your caregiver to show you how to check your access at home for a vibration (called a "thrill") or for a sound (called a "bruit" pronounced brew-ee). °· Your vein will need time to enlarge and mature so needles can be inserted for dialysis. Follow your caregiver's instructions about what you need to do to make this happen. °· Keep dressings clean and dry. °· Keep the arm elevated above your heart. Use a pillow. °· Rest. °· Use the arm as usual for all activities. °· Have the stitches or tape closures removed in 10 to 14 days, or as directed by your caregiver. °· Do not sleep or lie on the area of the fistula or that arm. This may decrease or stop the blood flow through your fistula. °· Do not allow blood pressures to be taken on this arm. °· Do not allow blood drawing to be done from the graft. °· Do not wear tight clothing around the access site or on the arm. °· Avoid lifting heavy objects with the arm that has the fistula. °· Do not use creams or lotions over the access site. °SEEK MEDICAL CARE IF:  °· You have a fever. °· You have swelling around the fistula that gets worse, or you have new pain. °· You have unusual bleeding at the fistula site or from any other area. °· You have pus or other drainage at the fistula site. °· You have skin  redness or red streaking on the skin around, above, or below the fistula site. °· Your access site feels warm. °· You have any flu-like symptoms. °SEEK IMMEDIATE MEDICAL CARE IF:  °· You have pain, numbness, or an unusual pale skin on the hand or on the side of your fistula. °· You have dizziness or weakness that you have not had before. °· You have shortness of breath. °· You have chest pain. °· Your fistula disconnects or breaks, and there is bleeding that cannot be easily controlled. °Call for local emergency medical help. Do not try to drive yourself to the hospital. °MAKE SURE YOU °· Understand these instructions. °· Will watch your condition. °· Will get help right away if you are not doing well or get worse. °  °This information is not intended to replace advice given to you by your health care provider. Make sure you discuss any questions you have with your health care provider. °  °Document Released: 05/13/2005 Document Revised: 06/03/2014 Document Reviewed: 10/31/2010 °Elsevier Interactive Patient Education ©2016 [employer] °AMBULATORY SURGERY  °DISCHARGE INSTRUCTIONS ° ° °1) The drugs that you were given will stay in your system until tomorrow so for the next 24 hours you should not: ° °A) Drive an automobile °B) Make any legal decisions °C) Drink any alcoholic beverage ° ° °2) You may resume regular meals tomorrow.  Today it is better to   start with liquids and gradually work up to solid foods.  You may eat anything you prefer, but it is better to start with liquids, then soup and crackers, and gradually work up to solid foods.   3) Please notify your doctor immediately if you have any unusual bleeding, trouble breathing, redness and pain at the surgery site, drainage, fever, or pain not relieved by medication.   4) Additional Instructions: Check for a pulse(thril) in your right arm daily, if the pulse feels weaker than normal, call Dr. Lucky Cowboy as soon as possible.                                                    Keep the right arm straight as much as possible to prevent restriction of blood flow in the arm.

## 2016-01-31 NOTE — Anesthesia Preprocedure Evaluation (Addendum)
Anesthesia Evaluation  Patient identified by MRN, date of birth, ID band Patient awake    Reviewed: Allergy & Precautions, H&P , NPO status , Patient's Chart, lab work & pertinent test results  History of Anesthesia Complications (+) history of anesthetic complications  Airway Mallampati: III  TM Distance: >3 FB Neck ROM: limited    Dental no notable dental hx. (+) Poor Dentition, Chipped   Pulmonary shortness of breath and with exertion, asthma , sleep apnea , COPD, Current Smoker,    Pulmonary exam normal breath sounds clear to auscultation       Cardiovascular Exercise Tolerance: Poor hypertension, (-) angina+ CAD, + Past MI, + Cardiac Stents, + Peripheral Vascular Disease, +CHF and + DOE  Normal cardiovascular examIII Rhythm:regular Rate:Normal     Neuro/Psych  Headaches, PSYCHIATRIC DISORDERS    GI/Hepatic Neg liver ROS, GERD  Controlled,  Endo/Other  diabetes, Poorly Controlled, Type 2, Insulin Dependent  Renal/GU Renal disease     Musculoskeletal  (+) Arthritis ,   Abdominal   Peds  Hematology negative hematology ROS (+)   Anesthesia Other Findings Patient endorses some baseline weakness and numbness in surgical arm.  Past Medical History: No date: Anemia No date: Arthritis No date: Asthma No date: CHF (congestive heart failure) (HCC) No date: Chronic systolic heart failure (HCC) No date: Complication of anesthesia     Comment: hypotension No date: COPD (chronic obstructive pulmonary disease) (* No date: Coronary artery disease No date: Diabetes mellitus without complication (HCC) No date: Dialysis patient [facility]     Comment: Mon, Wed, Fri No date: End stage renal disease ([place]) No date: GERD (gastroesophageal reflux disease) No date: Headache No date: HLD (hyperlipidemia) No date: HTN (hypertension) No date: Hyperparathyroidism No date: Myocardial infarction ([place]) No date: Peripheral vascular  disease ([place]) No date: Shortness of breath dyspnea No date: Sleep apnea No date: Tobacco dependence  Past Surgical History: 05/06/2014: AMPUTATION Left     Comment: Procedure: AMPUTATION BELOW KNEE;  Surgeon:               Elam Dutch, MD;  Location: [place];                Service: Vascular;  Laterality: Left; 01/12/2015: AMPUTATION Right     Comment: Procedure: Foot transmetatarsal amputation;                Surgeon: Algernon Huxley, MD;  Location: [facility];               Service: Vascular;  Laterality: Right; [id number]: APPLICATION OF WOUND VAC Right     Comment: Procedure: Application of Bio-connekt graft               and wound vac application to right foot ;                Surgeon: Algernon Huxley, MD;  Location: [facility];               Service: Vascular;  Laterality: Right; No date: AV FISTULA PLACEMENT Left No date: CARDIAC CATHETERIZATION     Comment: stent placement  No date: CORONARY ANGIOPLASTY 12/15/2014: PERIPHERAL VASCULAR CATHETERIZATION Right     Comment: Procedure: Lower Extremity Angiography;                Surgeon: Algernon Huxley, MD;  Location: [place] [facility];  Service: Cardiovascular;  Laterality: Right; 12/15/2014: PERIPHERAL VASCULAR CATHETERIZATION     Comment: Procedure: Lower Extremity Intervention;                Surgeon: Algernon Huxley, MD;  Location: [place] [facility];  Service: Cardiovascular;; 08/14/2015: PERIPHERAL VASCULAR CATHETERIZATION Right     Comment: Procedure: A/V Shuntogram/Fistulagram;                Surgeon: Algernon Huxley, MD;  Location: [place] [facility];  Service: Cardiovascular;                Laterality: Right; 08/14/2015: PERIPHERAL VASCULAR CATHETERIZATION N/A     Comment: Procedure: A/V Shunt Intervention;  Surgeon:               Algernon Huxley, MD;  Location: [place] [facility]               [facility];  Service: Cardiovascular;  Laterality:               N/A; 01/11/2016: PERIPHERAL  VASCULAR CATHETERIZATION N/A     Comment: Procedure: Dialysis/Perma Catheter Insertion;               Surgeon: Algernon Huxley, MD;  Location: [place] [facility];  Service: Cardiovascular;                Laterality: N/A; 05/04/2015: TRANSMETATARSAL AMPUTATION Right     Comment: Procedure: TRANSMETATARSAL AMPUTATION               REVISION, great toe amputation;  Surgeon: Algernon Huxley, MD;  Location: [facility];  Service:               Vascular;  Laterality: Right;     Reproductive/Obstetrics negative OB ROS                            Anesthesia Physical Anesthesia Plan  ASA: IV  Anesthesia Plan: General   Post-op Pain Management:  Regional for Post-op pain   Induction:   Airway Management Planned:   Additional Equipment:   Intra-op Plan:   Post-operative Plan:   Informed Consent: I have reviewed the patients History and Physical, chart, labs and discussed the procedure including the risks, benefits and alternatives for the proposed anesthesia with the patient or authorized representative who has indicated his/her understanding and acceptance.     Plan Discussed with: Anesthesiologist, CRNA and Surgeon  Anesthesia Plan Comments: (Patient informed that they are higher risk for complications from anesthesia during this procedure due to their medical history.  Patient voiced understanding.  Would like to try to use the regional block as our primary anesthetic as I feel that any risks related with this block are lower than the risks of general anesthesia.  Discussed with patient who voiced understanding.)      Anesthesia Quick Evaluation

## 2016-01-31 NOTE — OR Nursing (Signed)
Thrill fett in elbow of right arm.  Sling applied to right arm due to block and pt unable to control the right arm.

## 2016-01-31 NOTE — Anesthesia Postprocedure Evaluation (Signed)
Anesthesia Post Note  Patient: DRAEDEN VIELE  Procedure(s) Performed: Procedure(s) (LRB): REVISON OF ARTERIOVENOUS FISTULA ( BRACHIOCEPHALIC ) W/ ARTEGRAFT (Right) LIGATION OF ARTERIOVENOUS  FISTULA (Right)  Patient location during evaluation: PACU Anesthesia Type: General Level of consciousness: awake and alert Pain management: pain level controlled Vital Signs Assessment: post-procedure vital signs reviewed and stable Respiratory status: spontaneous breathing, nonlabored ventilation, respiratory function stable and patient connected to nasal cannula oxygen Cardiovascular status: blood pressure returned to baseline and stable Postop Assessment: no signs of nausea or vomiting Anesthetic complications: no    Last Vitals:  Vitals:   01/31/16 1245 01/31/16 1309  BP: 114/74 103/68  Pulse: (!) 59 (!) 59  Resp: (!) 21 20  Temp: 36.2 C 36.1 C    Last Pain:  Vitals:   01/31/16 1309  TempSrc: Oral  PainSc: 0-No pain                 Precious Haws Piscitello

## 2016-01-31 NOTE — H&P (Signed)
[facility] Admission History & Physical  MRN : [id number]  [name] is a 60 y.o. (11/14/1955) male who presents with chief complaint of No chief complaint on file. [name]  History of Present Illness: Patient with aneurysmal, draining right arm AVF revision.  Had a pseudoaneurysm covered with a stent months ago, but drainage and aneurysm has returned. Had a permcath placed already and not using the AVF now, but swelling and draining persists.  Has no fever or chills.  Here for surgical revision of this AVF and ligation of the aneurysms.  Current Facility-Administered Medications  Medication Dose Route Frequency Provider Last Rate Last Dose  . 0.9 %  sodium chloride infusion   Intravenous Continuous Precious Haws Piscitello, MD      . Chlorhexidine Gluconate Cloth 2 % PADS 6 each  6 each Topical Once Algernon Huxley, MD      . clindamycin (CLEOCIN) 300 MG/50ML IVPB           . clindamycin (CLEOCIN) IVPB 300 mg  300 mg Intravenous Once Algernon Huxley, MD      . fentaNYL (SUBLIMAZE) 100 MCG/2ML injection           . fentaNYL (SUBLIMAZE) injection 50 mcg  50 mcg Intravenous Once [name], MD      . midazolam (VERSED) 5 MG/5ML injection 1 mg  1 mg Intravenous Once [name], MD      . midazolam (VERSED) 5 MG/5ML injection             Past Medical History:  Diagnosis Date  . Anemia   . Arthritis   . Asthma   . CHF (congestive heart failure) ([place])   . Chronic systolic heart failure ([place])   . Complication of anesthesia    hypotension  . COPD (chronic obstructive pulmonary disease) ([place])   . Coronary artery disease   . Diabetes mellitus without complication ([place])   . Dialysis patient ([place])    Mon, Wed, Fri  . End stage renal disease ([place])   . GERD (gastroesophageal reflux disease)   . Headache   . HLD (hyperlipidemia)   . HTN (hypertension)   . Hyperparathyroidism   . Myocardial infarction ([place])   . Peripheral vascular disease ([place])   .  Shortness of breath dyspnea   . Sleep apnea   . Tobacco dependence     Past Surgical History:  Procedure Laterality Date  . AMPUTATION Left 05/06/2014   Procedure: AMPUTATION BELOW KNEE;  Surgeon: Elam Dutch, MD;  Location: [place];  Service: Vascular;  Laterality: Left;  . AMPUTATION Right 01/12/2015   Procedure: Foot transmetatarsal amputation;  Surgeon: Algernon Huxley, MD;  Location: [facility];  Service: Vascular;  Laterality: Right;  . APPLICATION OF WOUND VAC Right 03/01/2015   Procedure: Application of Bio-connekt graft and wound vac application to right foot ;  Surgeon: Algernon Huxley, MD;  Location: [facility];  Service: Vascular;  Laterality: Right;  . AV FISTULA PLACEMENT Left   . CARDIAC CATHETERIZATION     stent placement   . CORONARY ANGIOPLASTY    . PERIPHERAL VASCULAR CATHETERIZATION Right 12/15/2014   Procedure: Lower Extremity Angiography;  Surgeon: Algernon Huxley, MD;  Location: [place] [facility];  Service: Cardiovascular;  Laterality: Right;  . PERIPHERAL VASCULAR CATHETERIZATION  12/15/2014   Procedure: Lower Extremity Intervention;  Surgeon: Algernon Huxley, MD;  Location: [place] [facility];  Service: Cardiovascular;;  . PERIPHERAL  VASCULAR CATHETERIZATION Right 08/14/2015   Procedure: A/V Shuntogram/Fistulagram;  Surgeon: Algernon Huxley, MD;  Location: [place] [facility];  Service: Cardiovascular;  Laterality: Right;  . PERIPHERAL VASCULAR CATHETERIZATION N/A 08/14/2015   Procedure: A/V Shunt Intervention;  Surgeon: Algernon Huxley, MD;  Location: [place] [facility];  Service: Cardiovascular;  Laterality: N/A;  . PERIPHERAL VASCULAR CATHETERIZATION N/A 01/11/2016   Procedure: Dialysis/Perma Catheter Insertion;  Surgeon: Algernon Huxley, MD;  Location: [place] [facility];  Service: Cardiovascular;  Laterality: N/A;  . TRANSMETATARSAL AMPUTATION Right 05/04/2015   Procedure: TRANSMETATARSAL AMPUTATION REVISION, great toe amputation;  Surgeon: Algernon Huxley, MD;  Location: [facility];   Service: Vascular;  Laterality: Right;    Social History Social History  Substance Use Topics  . Smoking status: Current Every Day Smoker    Packs/day: 0.25    Types: Cigarettes  . Smokeless tobacco: Never Used     Comment: 5  . Alcohol use No  No IVDU  Family History Family History  Problem Relation Age of Onset  . Heart failure Other   . Hypertension Other   . Leukemia Other   . Diabetes Other   No bleeding disorders, clotting disorder, aneurysms, or autoimmune diseases  Allergies  Allergen Reactions  . Dust Mite Extract      REVIEW OF SYSTEMS (Negative unless checked)  Constitutional: [] Weight loss  [] Fever  [] Chills Cardiac: [] Chest pain   [] Chest pressure   [] Palpitations   [] Shortness of breath when laying flat   [] Shortness of breath at rest   [] Shortness of breath with exertion. Vascular:  [] Pain in legs with walking   [] Pain in legs at rest   [] Pain in legs when laying flat   [] Claudication   [] Pain in feet when walking  [] Pain in feet at rest  [] Pain in feet when laying flat   [] History of DVT   [] Phlebitis   [] Swelling in legs   [] Varicose veins   [x] Non-healing ulcers Pulmonary:   [] Uses home oxygen   [] Productive cough   [] Hemoptysis   [] Wheeze  [] COPD   [] Asthma Neurologic:  [] Dizziness  [] Blackouts   [] Seizures   [] History of stroke   [] History of TIA  [] Aphasia   [] Temporary blindness   [] Dysphagia   [] Weakness or numbness in arms   [] Weakness or numbness in legs Musculoskeletal:  [] Arthritis   [] Joint swelling   [] Joint pain   [] Low back pain Hematologic:  [] Easy bruising  [] Easy bleeding   [] Hypercoagulable state   [] Anemic  [] Hepatitis Gastrointestinal:  [] Blood in stool   [] Vomiting blood  [] Gastroesophageal reflux/heartburn   [] Difficulty swallowing. Genitourinary:  [x] Chronic kidney disease   [] Difficult urination  [] Frequent urination  [] Burning with urination   [] Blood in urine Skin:  [] Rashes   [x] Ulcers   [x] Wounds Psychological:  [] History of  anxiety   []  History of major depression.  Physical Examination  Vitals:   01/31/16 0801  BP: 118/75  Pulse: 62  Resp: 16  Temp: 98 F (36.7 C)  TempSrc: Oral  SpO2: 94%   There is no height or weight on file to calculate BMI. Gen: WD/WN, NAD Head: [place]/AT, No temporalis wasting. Prominent temp pulse not noted. Ear/Nose/Throat: Hearing grossly intact, nares w/o erythema or drainage, oropharynx w/o Erythema/Exudate,  Eyes: PERRLA, EOMI.  Neck: Supple, no nuchal rigidity.  No JVD.  Pulmonary:  Good air movement, no use of accessory muscles.  Cardiac: RRR, normal S1, S2 Vascular: aneurysmal right arm AVF and jump graft revision with  skin drainage and induration Vessel Right Left  Radial Palpable Palpable                                   Gastrointestinal: soft, non-tender/non-distended. No guarding/reflex.  Musculoskeletal: M/S 5/5 throughout.  Extremities without ischemic changes.  Left BKA, right TMA Neurologic: CN 2-12 intact. Pain and light touch intact in extremities.  Symmetrical.  Speech is fluent. Motor exam as listed above. Psychiatric: Judgment intact, Mood & affect appropriate for pt's clinical situation. Dermatologic: wounds at access site as above Lymph : No Cervical, Axillary, or Inguinal lymphadenopathy.     CBC Lab Results  Component Value Date   WBC 5.4 01/17/2016   HGB 12.9 (L) 01/31/2016   HCT 38.0 (L) 01/31/2016   MCV 96.7 01/17/2016   PLT 134 (L) 01/17/2016    BMET    Component Value Date/Time   NA 135 01/31/2016 0819   NA 135 (L) 04/12/2014 1341   K 5.2 (H) 01/31/2016 0819   K 5.2 01/12/2015 1210   K 3.6 04/12/2014 1341   CL 95 (L) 01/17/2016 1455   CL 97 (L) 04/12/2014 1341   CO2 27 01/17/2016 1455   CO2 28 04/12/2014 1341   GLUCOSE 83 01/31/2016 0819   GLUCOSE 85 04/12/2014 1341   BUN 55 (H) 01/17/2016 1455   BUN 40 (H) 04/12/2014 1341   CREATININE 5.58 (H) 01/17/2016 1455   CREATININE 9.58 (H) 04/12/2014 1341   CALCIUM 9.1  01/17/2016 1455   CALCIUM 9.5 04/12/2014 1341   GFRNONAA 10 (L) 01/17/2016 1455   GFRNONAA 6 (L) 03/15/2014 2323   GFRNONAA 6 (L) 02/10/2014 1119   GFRAA 11 (L) 01/17/2016 1455   GFRAA 7 (L) 03/15/2014 2323   GFRAA 7 (L) 02/10/2014 1119   Estimated Creatinine Clearance: 15.8 mL/min (by C-G formula based on SCr of 5.58 mg/dL).  COAG Lab Results  Component Value Date   INR 1.22 01/17/2016   INR 1.18 04/27/2015   INR 1.06 03/01/2015    Radiology No results found.    Assessment/Plan 1. Dysfunction of dialysis access with aneurysmal draining AVF and jump graft revision.  Needs surgical revision for any hope of salvaging the access.  Had a permcath placed and this was delayed previously by hypokalemia 2. ESRD. Long standing.  Access issues as above 3. PAD. Left BKA and right TMA. Previous interventions performed.  Currently stable 4. DM. On outpatient medications.   Aloria Looper, MD  01/31/2016 9:10 AM

## 2016-01-31 NOTE — Anesthesia Procedure Notes (Signed)
Anesthesia Regional Block:  Supraclavicular block  Pre-Anesthetic Checklist: ,, timeout performed, Correct Patient, Correct Site, Correct Laterality, Correct Procedure, Correct Position, site marked, Risks and benefits discussed,  Surgical consent,  Pre-op evaluation,  At surgeon's request and post-op pain management  Laterality: Upper and Right  Prep: chloraprep       Needles:  Injection technique: Single-shot  Needle Type: Stimiplex     Needle Length: 5cm 5 cm Needle Gauge: 22 and 22 G    Additional Needles:  Procedures: ultrasound guided (picture in chart) Supraclavicular block Narrative:  Start time: 01/31/2016 9:14 AM End time: 01/31/2016 9:16 AM Injection made incrementally with aspirations every 5 mL.  Performed by: Personally  Anesthesiologist: Katy Fitch K  Additional Notes: Patient reports baseline weakness and numbness in surgical arm.  Functioning IV was confirmed and monitors were applied.  A 70mm 22ga Stimuplex needle was used. Sterile prep,hand hygiene and sterile gloves were used.  Negative aspiration and negative test dose prior to incremental administration of local anesthetic. The patient tolerated the procedure well with no immediate complications.

## 2016-01-31 NOTE — OR Nursing (Signed)
Patient has purelent drainage from right upper arm dialysis site. Patient came with dressing over it and mod. Amount of drainage is on a 4x4 dressing. Patient reports Dr. Lucky Cowboy has seen this and there is less drainage then before. Dr. Lucky Cowboy called and he reports he has seen this and that it is okay to proceed with surgery. Also he was notified that patient is MRSA positive and he changed antibiotic order to clindamycin.

## 2016-01-31 NOTE — OR Nursing (Signed)
At 1412 report was given to Lillia Carmel LPN at [facility].  Papers/prescription for Percocet given to pt to give to Ms. Graves.

## 2016-01-31 NOTE — OR Nursing (Signed)
Spoke with Dr. Lucky Cowboy regarding blood on honey comb dressing, good thrill palpated in right elbow.  Dr. Lucky Cowboy said he expected the wound to ooze and it was OK to send him home.

## 2016-01-31 NOTE — OR Nursing (Signed)
Patient taken to PACU for placement of nerve block. Report given to [facility] RN.

## 2016-01-31 NOTE — Transfer of Care (Signed)
Immediate Anesthesia Transfer of Care Note  Patient: [name]  Procedure(s) Performed: Procedure(s): REVISON OF ARTERIOVENOUS FISTULA ( BRACHIOCEPHALIC ) W/ ARTEGRAFT (Right) LIGATION OF ARTERIOVENOUS  FISTULA (Right)  Patient Location: PACU  Anesthesia Type:Regional  Level of Consciousness: awake and alert   Airway & Oxygen Therapy: Patient Spontanous Breathing and Patient connected to face mask oxygen  Post-op Assessment: Report given to RN  Post vital signs: Reviewed and stable  Last Vitals:  Vitals:   01/31/16 0920 01/31/16 1215  BP: (!) 125/95 110/78  Pulse:  61  Resp:  20  Temp:  36.4 C    Last Pain:  Vitals:   01/31/16 0801  TempSrc: Oral  PainSc: 0-No pain         Complications: No apparent anesthesia complications

## 2016-01-31 NOTE — Op Note (Signed)
[facility] [place] [facility]   OPERATIVE NOTE   PROCEDURE: 1. Jump graft revision of right arm arteriovenous fistula with 7 mm Artegraft 2. Ligation and resection of previous Artergraft (first revision) including the Viabahn stent and draining pseudoaneurysm 3. 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 Draining pseudoaneurysm of Artegraft revision of previous fistula 3. ESRD  POST-OPERATIVE DIAGNOSIS: same as above   SURGEON: Leotis Pain, MD  ASSISTANT(S): none  ANESTHESIA: General  ESTIMATED BLOOD LOSS: 200 cc  FINDING(S): 1. Previous cephalic vein aneurysms 2. Draining pseudoaneurysm from Artegraft 3.  palpable thrill at end of the case  SPECIMEN(S):  Cephalic vein aneurysm contents Artergraft and Viabahn stent and pseudoaneurysm contents  INDICATIONS:   [name] is a 60 y.o. 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 He had residual aneurysms of his cephalic vein despite previous ligation and revision that were large and painful.  He had a previous Artegraft revision of this fistula in the past. He developed a pseudoaneurysm from an access site. This had been covered with a covered stent many months ago, but the skin has broken down and this is become a draining wound and now needs a revision. In order to salvage the fistula and decrease the bleeding complication risks, I recommended a jump graft revision with ligation of the access.  Risk, benefits, and alternatives to access surgery were discussed.  The patient is aware the risks include but are not limited to: bleeding, infection, steal syndrome, nerve damage, ischemic monomelic neuropathy, loss of the access, need for additional procedures, death and stroke.  The patient agrees to proceed forward with  the procedure.  DESCRIPTION: After obtaining full informed written consent, the patient was brought back to the operating room and placed supine upon the operating table.  The patient received IV antibiotics prior to induction.  After obtaining adequate anesthesia, the patient was prepped and draped in the standard fashion for the access procedure.  As incision was created near the arterial anastomosis prior to the aneurysmal segment.  The access was encircled with vessel loops and prepared for control.  I then created an incision in the proximal arm beyond the aneurysmal segment and encircled the access there for control with a vessel loop.  I then used the tunneller and tunnelled between the two incisions around the old access.  I brought a 7 mm Artegraft through the tunneller making sure to avoid twisting after marking for orientation.  The patient was then given 3000 units of intravenous heparin.  The access was then controlled and clamped and ligated distally with a silk suture ligature.  I prepared the end nearer the original arterial anastomosis for an anastomosis with the new Artegraft.  The anastomosis was created in an end to end fashion with two 6-0 Prolene sutures in the typical fashion.  I then flushed through the new graft and prepared this for the distal anastomosis.  The access was then divided and ligated again with a silk suture ligature.  The distal end which was the cephalic vein near the shoulder was then prepared for anastomosis with the new graft.  An anastomosis was then created with two 6-0 Prolene sutures in the usual fashion.  The graft was flushed and de-aired prior to release of control.  Patch sutures with 6-0 Prolene sutures were used as  needed for control of bleeding.  Surgicel and Evicel topical hemostatic agents were placed and hemostasis was complete.  I then closed the wound with 3-0 Vicryl suture in the subcutaneous space and a 4-0 Monocryl suture was used to close the skin.   Dermabond was placed as a dressing.   I then turned my attention to evacuation of the aneurysms and resection of a potentially infected portion of the Artegraft and Viabahn stent. An incision was created between the previous cephalic vein aneurysms and the first Artegraft revision with an ellipse to remove the open draining area over the Artegraft. The pseudoaneurysm over the Artegraft was evacuated and the Viabahn stent was then removed and what appeared to be its entirety. A significant portion of the Artegraft was also excised and removed with this dissection. We had already oversewn this proximally and distally as described above.  I then turned my attention to trying to evacuate and decrease the original cephalic vein aneurysms as they were quite large and painful. We dissected medially to identify what were clearly the aneurysmal cephalic vein segments. 2 spots were identified. This had been previously ligated from the fistula at his original revision, but vein branches had kept these areas open and this had remained enlarged. I started with the more distal and the larger of the 2 aneurysms. This was then incised and a significant amount of old clot and blood was evacuated. Venous bleeding was encountered and the vein was oversewn with 2 running 3-0 Vicryl sutures. I then turned my attention to the more proximal but smaller of the 2 aneurysms. This was then incised with electrocautery and again venous bleeding was identified and old clot was evacuated. The mural thrombus was sent as specimen from these 2 sites. This vein was also oversewn with 2 running 3-0 Vicryl sutures to help close the vein. The wounds were then irrigated. This incision was closed with a 2-0 Vicryl in the subcutaneous space. Due to the previous open wound, I elected to loosely close this incision with a series of interrupted mattress nylon sutures. 3-0 nylon sutures were used and the skin was loosely closed. Sterile dressing was then  placed over this with a honeycomb dressing.  The patient was then awakened from anesthesia and taken to the recovery room in stable condition having tolerated the procedure well.    COMPLICATIONS: none  CONDITION: stable  [name],[name]  01/31/2016, 12:16 PM

## 2016-02-01 ENCOUNTER — Encounter: Payer: Self-pay | Admitting: Vascular Surgery

## 2016-02-01 LAB — SURGICAL PATHOLOGY

## 2016-02-07 ENCOUNTER — Encounter (INDEPENDENT_AMBULATORY_CARE_PROVIDER_SITE_OTHER): Payer: Self-pay

## 2016-02-14 ENCOUNTER — Encounter (INDEPENDENT_AMBULATORY_CARE_PROVIDER_SITE_OTHER): Payer: Self-pay

## 2016-02-17 ENCOUNTER — Encounter: Payer: Self-pay | Admitting: Emergency Medicine

## 2016-02-17 ENCOUNTER — Encounter
Admission: EM | Disposition: A | Discharge status: Skilled Nursing Facility | Payer: Self-pay | Source: Home / Self Care | Attending: Emergency Medicine

## 2016-02-17 ENCOUNTER — Emergency Department: Payer: Medicare Other | Admitting: Certified Registered Nurse Anesthetist

## 2016-02-17 ENCOUNTER — Emergency Department
Admission: EM | Admit: 2016-02-17 | Discharge: 2016-02-17 | Disposition: A | Discharge status: Skilled Nursing Facility | Payer: Medicare Other | Attending: Emergency Medicine | Admitting: Emergency Medicine

## 2016-02-17 DIAGNOSIS — Y828 Other medical devices associated with adverse incidents: Secondary | ICD-10-CM | POA: Insufficient documentation

## 2016-02-17 DIAGNOSIS — I5022 Chronic systolic (congestive) heart failure: Secondary | ICD-10-CM | POA: Insufficient documentation

## 2016-02-17 DIAGNOSIS — Z89512 Acquired absence of left leg below knee: Secondary | ICD-10-CM | POA: Diagnosis not present

## 2016-02-17 DIAGNOSIS — J449 Chronic obstructive pulmonary disease, unspecified: Secondary | ICD-10-CM | POA: Insufficient documentation

## 2016-02-17 DIAGNOSIS — Z89431 Acquired absence of right foot: Secondary | ICD-10-CM | POA: Diagnosis not present

## 2016-02-17 DIAGNOSIS — M1990 Unspecified osteoarthritis, unspecified site: Secondary | ICD-10-CM | POA: Insufficient documentation

## 2016-02-17 DIAGNOSIS — T827XXA Infection and inflammatory reaction due to other cardiac and vascular devices, implants and grafts, initial encounter: Secondary | ICD-10-CM

## 2016-02-17 DIAGNOSIS — Z79899 Other long term (current) drug therapy: Secondary | ICD-10-CM | POA: Insufficient documentation

## 2016-02-17 DIAGNOSIS — D649 Anemia, unspecified: Secondary | ICD-10-CM | POA: Insufficient documentation

## 2016-02-17 DIAGNOSIS — E785 Hyperlipidemia, unspecified: Secondary | ICD-10-CM | POA: Diagnosis not present

## 2016-02-17 DIAGNOSIS — E1122 Type 2 diabetes mellitus with diabetic chronic kidney disease: Secondary | ICD-10-CM | POA: Diagnosis not present

## 2016-02-17 DIAGNOSIS — Z791 Long term (current) use of non-steroidal anti-inflammatories (NSAID): Secondary | ICD-10-CM | POA: Diagnosis not present

## 2016-02-17 DIAGNOSIS — T82898A Other specified complication of vascular prosthetic devices, implants and grafts, initial encounter: Secondary | ICD-10-CM | POA: Diagnosis present

## 2016-02-17 DIAGNOSIS — E1151 Type 2 diabetes mellitus with diabetic peripheral angiopathy without gangrene: Secondary | ICD-10-CM | POA: Insufficient documentation

## 2016-02-17 DIAGNOSIS — K219 Gastro-esophageal reflux disease without esophagitis: Secondary | ICD-10-CM | POA: Insufficient documentation

## 2016-02-17 DIAGNOSIS — G4730 Sleep apnea, unspecified: Secondary | ICD-10-CM | POA: Diagnosis not present

## 2016-02-17 DIAGNOSIS — R58 Hemorrhage, not elsewhere classified: Secondary | ICD-10-CM | POA: Diagnosis present

## 2016-02-17 DIAGNOSIS — I132 Hypertensive heart and chronic kidney disease with heart failure and with stage 5 chronic kidney disease, or end stage renal disease: Secondary | ICD-10-CM | POA: Diagnosis not present

## 2016-02-17 DIAGNOSIS — I252 Old myocardial infarction: Secondary | ICD-10-CM | POA: Insufficient documentation

## 2016-02-17 DIAGNOSIS — F17210 Nicotine dependence, cigarettes, uncomplicated: Secondary | ICD-10-CM | POA: Diagnosis not present

## 2016-02-17 DIAGNOSIS — Z992 Dependence on renal dialysis: Secondary | ICD-10-CM | POA: Insufficient documentation

## 2016-02-17 DIAGNOSIS — Z7982 Long term (current) use of aspirin: Secondary | ICD-10-CM | POA: Insufficient documentation

## 2016-02-17 DIAGNOSIS — Z955 Presence of coronary angioplasty implant and graft: Secondary | ICD-10-CM | POA: Diagnosis not present

## 2016-02-17 DIAGNOSIS — I2510 Atherosclerotic heart disease of native coronary artery without angina pectoris: Secondary | ICD-10-CM | POA: Insufficient documentation

## 2016-02-17 DIAGNOSIS — N186 End stage renal disease: Secondary | ICD-10-CM | POA: Insufficient documentation

## 2016-02-17 HISTORY — PX: REVISON OF ARTERIOVENOUS FISTULA: SHX6074

## 2016-02-17 LAB — CBC WITH DIFFERENTIAL/PLATELET
BASOS PCT: 1 %
Basophils Absolute: 0.1 10*3/uL (ref 0–0.1)
EOS ABS: 0.2 10*3/uL (ref 0–0.7)
Eosinophils Relative: 2 %
HEMATOCRIT: 35.2 % — AB (ref 40.0–52.0)
HEMOGLOBIN: 11.7 g/dL — AB (ref 13.0–18.0)
Lymphocytes Relative: 9 %
Lymphs Abs: 0.8 10*3/uL — ABNORMAL LOW (ref 1.0–3.6)
MCH: 31.7 pg (ref 26.0–34.0)
MCHC: 33.2 g/dL (ref 32.0–36.0)
MCV: 95.4 fL (ref 80.0–100.0)
MONOS PCT: 7 %
Monocytes Absolute: 0.7 10*3/uL (ref 0.2–1.0)
NEUTROS ABS: 7.2 10*3/uL — AB (ref 1.4–6.5)
NEUTROS PCT: 81 %
Platelets: 166 10*3/uL (ref 150–440)
RBC: 3.68 MIL/uL — AB (ref 4.40–5.90)
RDW: 13.7 % (ref 11.5–14.5)
WBC: 8.9 10*3/uL (ref 3.8–10.6)

## 2016-02-17 LAB — COMPREHENSIVE METABOLIC PANEL
ALBUMIN: 3 g/dL — AB (ref 3.5–5.0)
ALK PHOS: 95 U/L (ref 38–126)
ALT: 37 U/L (ref 17–63)
AST: 45 U/L — AB (ref 15–41)
Anion gap: 10 (ref 5–15)
BILIRUBIN TOTAL: 0.4 mg/dL (ref 0.3–1.2)
BUN: 71 mg/dL — AB (ref 6–20)
CALCIUM: 8.8 mg/dL — AB (ref 8.9–10.3)
CO2: 24 mmol/L (ref 22–32)
CREATININE: 8.85 mg/dL — AB (ref 0.61–1.24)
Chloride: 98 mmol/L — ABNORMAL LOW (ref 101–111)
GFR calc Af Amer: 7 mL/min — ABNORMAL LOW (ref >60)
GFR calc non Af Amer: 6 mL/min — ABNORMAL LOW (ref >60)
GLUCOSE: 92 mg/dL (ref 65–99)
Potassium: 5.1 mmol/L (ref 3.5–5.1)
SODIUM: 132 mmol/L — AB (ref 135–145)
TOTAL PROTEIN: 7.9 g/dL (ref 6.5–8.1)

## 2016-02-17 LAB — GLUCOSE, CAPILLARY: Glucose-Capillary: 109 mg/dL — ABNORMAL HIGH (ref 65–99)

## 2016-02-17 SURGERY — REVISON OF ARTERIOVENOUS FISTULA
Anesthesia: General | Laterality: Right | Wound class: Dirty or Infected

## 2016-02-17 MED ORDER — FENTANYL CITRATE (PF) 100 MCG/2ML IJ SOLN
25.0000 ug | INTRAMUSCULAR | Status: DC | PRN
Start: 2016-02-17 — End: 2016-02-18
  Administered 2016-02-17 (×4): 25 ug via INTRAVENOUS

## 2016-02-17 MED ORDER — FENTANYL CITRATE (PF) 100 MCG/2ML IJ SOLN
INTRAMUSCULAR | Status: AC
  Administered 2016-02-17: 25 ug via INTRAVENOUS
  Filled 2016-02-17: qty 2

## 2016-02-17 MED ORDER — CEFAZOLIN IN D5W 1 GM/50ML IV SOLN
INTRAVENOUS | Status: DC | PRN
  Administered 2016-02-17: 1 g via INTRAVENOUS

## 2016-02-17 MED ORDER — ONDANSETRON HCL 4 MG/2ML IJ SOLN
INTRAMUSCULAR | Status: DC | PRN
  Administered 2016-02-17: 4 mg via INTRAVENOUS

## 2016-02-17 MED ORDER — PHENYLEPHRINE HCL 10 MG/ML IJ SOLN
INTRAMUSCULAR | Status: DC | PRN
  Administered 2016-02-17 (×8): 100 ug via INTRAVENOUS

## 2016-02-17 MED ORDER — EPHEDRINE SULFATE 50 MG/ML IJ SOLN
INTRAMUSCULAR | Status: DC | PRN
  Administered 2016-02-17: 10 mg via INTRAVENOUS

## 2016-02-17 MED ORDER — OXYCODONE HCL 5 MG PO TABS
5.0000 mg | ORAL_TABLET | ORAL | Status: DC | PRN
  Administered 2016-02-17: 5 mg via ORAL

## 2016-02-17 MED ORDER — DEXAMETHASONE SODIUM PHOSPHATE 10 MG/ML IJ SOLN
INTRAMUSCULAR | Status: DC | PRN
  Administered 2016-02-17: 5 mg via INTRAVENOUS

## 2016-02-17 MED ORDER — PROPOFOL 10 MG/ML IV BOLUS
INTRAVENOUS | Status: DC | PRN
  Administered 2016-02-17: 150 mg via INTRAVENOUS
  Administered 2016-02-17: 50 mg via INTRAVENOUS

## 2016-02-17 MED ORDER — SODIUM CHLORIDE 0.9 % IV SOLN
INTRAVENOUS | Status: DC | PRN
  Administered 2016-02-17: 18:00:00 via INTRAVENOUS

## 2016-02-17 MED ORDER — LIDOCAINE HCL (CARDIAC) 20 MG/ML IV SOLN
INTRAVENOUS | Status: DC | PRN
Start: 2016-02-17 — End: 2016-02-17
  Administered 2016-02-17: 30 mg via INTRAVENOUS

## 2016-02-17 MED ORDER — ONDANSETRON HCL 4 MG/2ML IJ SOLN: 4.0000 mg | Freq: Once | INTRAMUSCULAR | Status: DC | PRN

## 2016-02-17 MED ORDER — FENTANYL CITRATE (PF) 100 MCG/2ML IJ SOLN
INTRAMUSCULAR | Status: DC | PRN
  Administered 2016-02-17: 50 ug via INTRAVENOUS

## 2016-02-17 MED ORDER — OXYCODONE HCL 5 MG PO TABS: 5.0000 mg | ORAL_TABLET | ORAL | 0 refills | Status: DC | PRN

## 2016-02-17 MED ORDER — OXYCODONE HCL 5 MG PO TABS
ORAL_TABLET | ORAL | Status: AC
  Filled 2016-02-17: qty 1

## 2016-02-17 SURGICAL SUPPLY — 66 items
APPLIER CLIP 9.375 SM OPEN (CLIP) ×3
BAG DECANTER FOR FLEXI CONT (MISCELLANEOUS) ×3 IMPLANT
BANDAGE ELASTIC 4 CLIP ST LF (GAUZE/BANDAGES/DRESSINGS) ×3 IMPLANT
BLADE SURG SZ11 CARB STEEL (BLADE) ×3 IMPLANT
BNDG GAUZE 4.5X4.1 6PLY STRL (MISCELLANEOUS) ×3 IMPLANT
BOOT SUTURE AID YELLOW STND (SUTURE) ×3 IMPLANT
BRUSH SCRUB 4% CHG (MISCELLANEOUS) ×3 IMPLANT
CANISTER SUCT 1200ML W/VALVE (MISCELLANEOUS) ×3 IMPLANT
CHLORAPREP W/TINT 26ML (MISCELLANEOUS) ×3 IMPLANT
CLIP APPLIE 9.375 SM OPEN (CLIP) ×1 IMPLANT
CLIP SPRNG 6MM S-JAW DBL (CLIP) ×3
CUFF TOURN 18 STER (MISCELLANEOUS) ×3 IMPLANT
ELECT CAUTERY BLADE 6.4 (BLADE) ×3 IMPLANT
ELECT REM PT RETURN 9FT ADLT (ELECTROSURGICAL) ×3
ELECTRODE REM PT RTRN 9FT ADLT (ELECTROSURGICAL) ×1 IMPLANT
GAUZE SPONGE 4X4 12PLY STRL (GAUZE/BANDAGES/DRESSINGS) ×3 IMPLANT
GEL ULTRASOUND 20GR AQUASONIC (MISCELLANEOUS) ×3 IMPLANT
GLOVE BIO SURGEON STRL SZ7 (GLOVE) IMPLANT
GLOVE BIOGEL PI IND STRL 7.5 (GLOVE) ×3 IMPLANT
GLOVE BIOGEL PI INDICATOR 7.5 (GLOVE) ×6
GLOVE INDICATOR 7.5 STRL GRN (GLOVE) ×3 IMPLANT
GLOVE SURG SYN 7.5  E (GLOVE) ×6
GLOVE SURG SYN 7.5 E (GLOVE) ×3 IMPLANT
GOWN STRL REUS W/ TWL LRG LVL3 (GOWN DISPOSABLE) ×2 IMPLANT
GOWN STRL REUS W/ TWL XL LVL3 (GOWN DISPOSABLE) ×1 IMPLANT
GOWN STRL REUS W/TWL LRG LVL3 (GOWN DISPOSABLE) ×4
GOWN STRL REUS W/TWL XL LVL3 (GOWN DISPOSABLE) ×2
HEMOSTAT SURGICEL 2X3 (HEMOSTASIS) ×3 IMPLANT
IV NS 500ML (IV SOLUTION) ×2
IV NS 500ML BAXH (IV SOLUTION) ×1 IMPLANT
KIT RM TURNOVER STRD PROC AR (KITS) ×3 IMPLANT
LABEL OR SOLS (LABEL) ×3 IMPLANT
LIQUID BAND (GAUZE/BANDAGES/DRESSINGS) ×3 IMPLANT
LOOP RED MAXI  1X406MM (MISCELLANEOUS) ×2
LOOP VESSEL MAXI 1X406 RED (MISCELLANEOUS) ×1 IMPLANT
LOOP VESSEL MINI 0.8X406 BLUE (MISCELLANEOUS) ×1 IMPLANT
LOOPS BLUE MINI 0.8X406MM (MISCELLANEOUS) ×2
NEEDLE FILTER BLUNT 18X 1/2SAF (NEEDLE)
NEEDLE FILTER BLUNT 18X1 1/2 (NEEDLE) IMPLANT
NEEDLE HYPO 30X.5 LL (NEEDLE) IMPLANT
NS IRRIG 500ML POUR BTL (IV SOLUTION) ×3 IMPLANT
PACK EXTREMITY ARMC (MISCELLANEOUS) ×3 IMPLANT
PAD PREP 24X41 OB/GYN DISP (PERSONAL CARE ITEMS) ×3 IMPLANT
SOLUTION CELL SAVER (CLIP) ×1 IMPLANT
SPONGE LAP 18X18 5 PK (GAUZE/BANDAGES/DRESSINGS) ×3 IMPLANT
STOCKINETTE STRL 4IN 9604848 (GAUZE/BANDAGES/DRESSINGS) ×3 IMPLANT
SUT ETHILON 2 0 FS 18 (SUTURE) ×3 IMPLANT
SUT MNCRL 4-0 (SUTURE) ×2
SUT MNCRL 4-0 27XMFL (SUTURE) ×1
SUT MNCRL AB 4-0 PS2 18 (SUTURE) IMPLANT
SUT PROLENE 5 0 RB 1 DA (SUTURE) ×6 IMPLANT
SUT PROLENE 6 0 BV (SUTURE) ×6 IMPLANT
SUT SILK 2 0 (SUTURE) ×2
SUT SILK 2-0 18XBRD TIE 12 (SUTURE) ×1 IMPLANT
SUT SILK 3 0 (SUTURE) ×2
SUT SILK 3-0 18XBRD TIE 12 (SUTURE) ×1 IMPLANT
SUT SILK 4 0 (SUTURE) ×2
SUT SILK 4-0 18XBRD TIE 12 (SUTURE) ×1 IMPLANT
SUT VIC AB 3-0 SH 27 (SUTURE) ×4
SUT VIC AB 3-0 SH 27X BRD (SUTURE) ×2 IMPLANT
SUTURE MNCRL 4-0 27XMF (SUTURE) ×1 IMPLANT
SWAB DUAL CULTURE TRANS RED ST (MISCELLANEOUS) ×3 IMPLANT
SYR 20CC LL (SYRINGE) ×3 IMPLANT
SYR 3ML LL SCALE MARK (SYRINGE) IMPLANT
SYR TB 1ML 27GX1/2 LL (SYRINGE) IMPLANT
TOWEL OR 17X26 4PK STRL BLUE (TOWEL DISPOSABLE) IMPLANT

## 2016-02-17 NOTE — ED Triage Notes (Signed)
Patient from [facility] with complaint of bleeding right fistula. Bleeding Is currently controlled with a wrap that was placed by [facility]

## 2016-02-17 NOTE — Anesthesia Preprocedure Evaluation (Signed)
Anesthesia Evaluation  Patient identified by MRN, date of birth, ID band Patient awake    Reviewed: Allergy & Precautions, H&P , NPO status , Patient's Chart, lab work & pertinent test results  History of Anesthesia Complications (+) history of anesthetic complications  Airway Mallampati: III  TM Distance: >3 FB Neck ROM: limited    Dental no notable dental hx. (+) Poor Dentition, Chipped   Pulmonary shortness of breath and with exertion, asthma , sleep apnea , COPD, Current Smoker,    Pulmonary exam normal breath sounds clear to auscultation       Cardiovascular Exercise Tolerance: Poor hypertension, (-) angina+ CAD, + Past MI, + Cardiac Stents, + Peripheral Vascular Disease, +CHF and + DOE  Normal cardiovascular examIII Rhythm:regular Rate:Normal     Neuro/Psych  Headaches, PSYCHIATRIC DISORDERS    GI/Hepatic Neg liver ROS, GERD  Controlled,  Endo/Other  diabetes, Poorly Controlled, Type 2, Insulin Dependent  Renal/GU ESRF and DialysisRenal disease     Musculoskeletal  (+) Arthritis ,   Abdominal   Peds  Hematology  (+) Blood dyscrasia, anemia ,   Anesthesia Other Findings Patient endorses some baseline weakness and numbness in surgical arm.  Past Medical History: No date: Anemia No date: Arthritis No date: Asthma No date: CHF (congestive heart failure) (HCC) No date: Chronic systolic heart failure (HCC) No date: Complication of anesthesia     Comment: hypotension No date: COPD (chronic obstructive pulmonary disease) (* No date: Coronary artery disease No date: Diabetes mellitus without complication (HCC) No date: Dialysis patient [facility])     Comment: Mon, Wed, Fri No date: End stage renal disease ([place]) No date: GERD (gastroesophageal reflux disease) No date: Headache No date: HLD (hyperlipidemia) No date: HTN (hypertension) No date: Hyperparathyroidism No date: Myocardial infarction ([place]) No date:  Peripheral vascular disease ([place]) No date: Shortness of breath dyspnea No date: Sleep apnea No date: Tobacco dependence  Past Surgical History: 05/06/2014: AMPUTATION Left     Comment: Procedure: AMPUTATION BELOW KNEE;  Surgeon:               Elam Dutch, MD;  Location: [place];                Service: Vascular;  Laterality: Left; 01/12/2015: AMPUTATION Right     Comment: Procedure: Foot transmetatarsal amputation;                Surgeon: Algernon Huxley, MD;  Location: [facility];               Service: Vascular;  Laterality: Right; 27/06/5364: APPLICATION OF WOUND VAC Right     Comment: Procedure: Application of Bio-connekt graft               and wound vac application to right foot ;                Surgeon: Algernon Huxley, MD;  Location: [facility];               Service: Vascular;  Laterality: Right; No date: AV FISTULA PLACEMENT Left No date: CARDIAC CATHETERIZATION     Comment: stent placement  No date: CORONARY ANGIOPLASTY 12/15/2014: PERIPHERAL VASCULAR CATHETERIZATION Right     Comment: Procedure: Lower Extremity Angiography;                Surgeon: Algernon Huxley, MD;  Location: [place] [facility];  Service: Cardiovascular;                Laterality: Right; 12/15/2014: PERIPHERAL VASCULAR CATHETERIZATION     Comment: Procedure: Lower Extremity Intervention;                Surgeon: Algernon Huxley, MD;  Location: [place] [facility];  Service: Cardiovascular;; 08/14/2015: PERIPHERAL VASCULAR CATHETERIZATION Right     Comment: Procedure: A/V Shuntogram/Fistulagram;                Surgeon: Algernon Huxley, MD;  Location: [place] [facility];  Service: Cardiovascular;                Laterality: Right; 08/14/2015: PERIPHERAL VASCULAR CATHETERIZATION N/A     Comment: Procedure: A/V Shunt Intervention;  Surgeon:               Algernon Huxley, MD;  Location: [place] [facility]               [facility];  Service: Cardiovascular;  Laterality:                N/A; 01/11/2016: PERIPHERAL VASCULAR CATHETERIZATION N/A     Comment: Procedure: Dialysis/Perma Catheter Insertion;               Surgeon: Algernon Huxley, MD;  Location: [place] [facility];  Service: Cardiovascular;                Laterality: N/A; 05/04/2015: TRANSMETATARSAL AMPUTATION Right     Comment: Procedure: TRANSMETATARSAL AMPUTATION               REVISION, great toe amputation;  Surgeon: Algernon Huxley, MD;  Location: [facility];  Service:               Vascular;  Laterality: Right;     Reproductive/Obstetrics negative OB ROS                             Anesthesia Physical  Anesthesia Plan  ASA: IV  Anesthesia Plan: General   Post-op Pain Management:  Regional for Post-op pain   Induction:   Airway Management Planned:   Additional Equipment:   Intra-op Plan:   Post-operative Plan:   Informed Consent: I have reviewed the patients History and Physical, chart, labs and discussed the procedure including the risks, benefits and alternatives for the proposed anesthesia with the patient or authorized representative who has indicated his/her understanding and acceptance.     Plan Discussed with: Anesthesiologist, CRNA and Surgeon  Anesthesia Plan Comments: (Patient informed that they are higher risk for complications from anesthesia during this procedure due to their medical history.  Patient voiced understanding.  Would like to try to use the regional block as our primary anesthetic as I feel that any risks related with this block are lower than the risks of general anesthesia.  Discussed with patient who voiced understanding.)        Anesthesia Quick Evaluation

## 2016-02-17 NOTE — Transfer of Care (Signed)
Immediate Anesthesia Transfer of Care Note  Patient: [name]  Procedure(s) Performed: Procedure(s): removal of AV fistula (Right)  Patient Location: PACU  Anesthesia Type:General  Level of Consciousness: Alert, Awake, Oriented  Airway & Oxygen Therapy: Patient Spontanous Breathing  Post-op Assessment: Report given to RN  Post vital signs: Reviewed and stable  Last Vitals:  Vitals:   02/17/16 1700 02/17/16 2025  BP: 115/79 112/72  Pulse: 75 67  Resp: 18 20  Temp: 36.4 C 58.3 C    Complications: No apparent anesthesia complications

## 2016-02-17 NOTE — ED Provider Notes (Signed)
[facility] Emergency Department Provider Note  ] ____________________________________________   First MD Initiated Contact with Patient 02/17/16 1459     (approximate)  I have reviewed the triage vital signs and the nursing notes.   HISTORY  Chief Complaint Coagulation Disorder  HPI [name] is a 60 y.o. male patient reports he had dialysis yesterday and then today was sitting talking on the phone when his shunt started spurting blood out. He went to dialysis for that but a pressure dressing on it and came here. I spoke to Dr.bABHAM who will come in to see him and probably repair the fistula. Patient is okay now he took the pressure dressing off and it began oozing again so we will replace the pressure dressing at his surgeon's request.   Past Medical History:  Diagnosis Date  . Anemia   . Arthritis   . Asthma   . CHF (congestive heart failure) ([place])   . Chronic systolic heart failure ([place])   . Complication of anesthesia    hypotension  . COPD (chronic obstructive pulmonary disease) ([place])   . Coronary artery disease   . Diabetes mellitus without complication ([place])   . Dialysis patient ([place])    Mon, Wed, Fri  . End stage renal disease ([place])   . GERD (gastroesophageal reflux disease)   . Headache   . HLD (hyperlipidemia)   . HTN (hypertension)   . Hyperparathyroidism   . Myocardial infarction ([place])   . Peripheral vascular disease ([place])   . Shortness of breath dyspnea   . Sleep apnea   . Tobacco dependence     Patient Active Problem List   Diagnosis Date Noted  . Preoperative cardiovascular examination 05/02/2015  . Chronic systolic heart failure ([place])   . Sepsis due to cellulitis ([place]) 01/13/2015  . Acute encephalopathy 01/13/2015  . Hyperkalemia 01/13/2015  . Gangrene of foot ([place]) 05/04/2014  . ESRD (end stage renal disease) on dialysis ([place]) 05/04/2014  . CAD (coronary artery disease) 05/04/2014  . Normocytic anemia  05/04/2014  . HYPERLIPIDEMIA-MIXED 04/26/2010  . HYPERTENSION, BENIGN 04/26/2010  . CAD, NATIVE VESSEL 04/26/2010  . End stage renal disease ([place]) 04/26/2010    Past Surgical History:  Procedure Laterality Date  . AMPUTATION Left 05/06/2014   Procedure: AMPUTATION BELOW KNEE;  Surgeon: Elam Dutch, MD;  Location: [place];  Service: Vascular;  Laterality: Left;  . AMPUTATION Right 01/12/2015   Procedure: Foot transmetatarsal amputation;  Surgeon: Algernon Huxley, MD;  Location: [facility];  Service: Vascular;  Laterality: Right;  . APPLICATION OF WOUND VAC Right 03/01/2015   Procedure: Application of Bio-connekt graft and wound vac application to right foot ;  Surgeon: Algernon Huxley, MD;  Location: [facility];  Service: Vascular;  Laterality: Right;  . AV FISTULA PLACEMENT Left   . CARDIAC CATHETERIZATION     stent placement   . CORONARY ANGIOPLASTY    . LIGATION OF ARTERIOVENOUS  FISTULA Right 01/31/2016   Procedure: LIGATION OF ARTERIOVENOUS  FISTULA;  Surgeon: Algernon Huxley, MD;  Location: [facility];  Service: Vascular;  Laterality: Right;  . PERIPHERAL VASCULAR CATHETERIZATION Right 12/15/2014   Procedure: Lower Extremity Angiography;  Surgeon: Algernon Huxley, MD;  Location: [place] [facility];  Service: Cardiovascular;  Laterality: Right;  . PERIPHERAL VASCULAR CATHETERIZATION  12/15/2014   Procedure: Lower Extremity Intervention;  Surgeon: Algernon Huxley, MD;  Location: [place] [facility];  Service: Cardiovascular;;  . PERIPHERAL VASCULAR CATHETERIZATION Right  08/14/2015   Procedure: A/V Shuntogram/Fistulagram;  Surgeon: Algernon Huxley, MD;  Location: [place] [facility];  Service: Cardiovascular;  Laterality: Right;  . PERIPHERAL VASCULAR CATHETERIZATION N/A 08/14/2015   Procedure: A/V Shunt Intervention;  Surgeon: Algernon Huxley, MD;  Location: [place] [facility];  Service: Cardiovascular;  Laterality: N/A;  . PERIPHERAL VASCULAR CATHETERIZATION N/A 01/11/2016   Procedure: Dialysis/Perma Catheter  Insertion;  Surgeon: Algernon Huxley, MD;  Location: [place] [facility];  Service: Cardiovascular;  Laterality: N/A;  . REVISON OF ARTERIOVENOUS FISTULA Right 01/31/2016   Procedure: REVISON OF ARTERIOVENOUS FISTULA ( BRACHIOCEPHALIC ) W/ ARTEGRAFT;  Surgeon: Algernon Huxley, MD;  Location: [facility];  Service: Vascular;  Laterality: Right;  . TRANSMETATARSAL AMPUTATION Right 05/04/2015   Procedure: TRANSMETATARSAL AMPUTATION REVISION, great toe amputation;  Surgeon: Algernon Huxley, MD;  Location: [facility];  Service: Vascular;  Laterality: Right;    Prior to Admission medications   Medication Sig Start Date End Date Taking? Authorizing Provider  acetaminophen (TYLENOL) 500 MG tablet Take 1,000 mg by mouth every 6 (six) hours as needed for moderate pain.    Historical Provider, MD  albuterol (PROVENTIL HFA;VENTOLIN HFA) 108 (90 BASE) MCG/ACT inhaler Inhale 1 puff into the lungs every 6 (six) hours as needed for wheezing or shortness of breath.    Historical Provider, MD  aspirin 325 MG tablet Take 325 mg by mouth daily.     Historical Provider, MD  atorvastatin (LIPITOR) 10 MG tablet Take 10 mg by mouth daily.    Historical Provider, MD  budesonide-formoterol (SYMBICORT) 160-4.5 MCG/ACT inhaler Inhale 2 puffs into the lungs 2 (two) times daily. Reported on 08/14/2015    Historical Provider, MD  calcium acetate (PHOSLO) 667 MG capsule Take 667 mg by mouth 3 (three) times daily with meals.    Historical Provider, MD  Darbepoetin Alfa (ARANESP) 40 MCG/0.4ML SOSY injection Inject 0.4 mLs (40 mcg total) into the vein every Monday with hemodialysis. 05/16/14   Shanker Kristeen Mans, MD  docusate sodium (COLACE) 100 MG capsule Take 100 mg by mouth 2 (two) times daily. Reported on 08/14/2015    Historical Provider, MD  doxercalciferol (HECTOROL) 4 MCG/2ML injection Inject 1.25 mLs (2.5 mcg total) into the vein every Monday, Wednesday, and Friday with hemodialysis. 05/10/14   Shanker Kristeen Mans, MD  ferric gluconate 125 mg in  sodium chloride 0.9 % 100 mL Inject 125 mg into the vein every Monday, Wednesday, and Friday with hemodialysis. 05/10/14   Shanker Kristeen Mans, MD  gabapentin (NEURONTIN) 300 MG capsule Take 1 capsule by mouth at bedtime. 12/23/14   Historical Provider, MD  ibuprofen (ADVIL,MOTRIN) 600 MG tablet Take 600 mg by mouth every 6 (six) hours as needed.    Historical Provider, MD  ipratropium-albuterol (DUONEB) 0.5-2.5 (3) MG/3ML SOLN Take 3 mLs by nebulization every 4 (four) hours as needed (SOB).    Historical Provider, MD  metoprolol tartrate (LOPRESSOR) 25 MG tablet Take 25 mg by mouth 2 (two) times daily.      Historical Provider, MD  midodrine (PROAMATINE) 5 MG tablet Take 5 mg by mouth. 3 times a week, 30 minutes before dialysis    Historical Provider, MD  multivitamin (RENA-VIT) TABS tablet Take 1 tablet by mouth at bedtime. 05/10/14   Shanker Kristeen Mans, MD  nicotine (NICODERM CQ - DOSED IN MG/24 HOURS) 14 mg/24hr patch Place 1 patch (14 mg total) onto the skin daily. Patient not taking: Reported on 01/18/2016 05/02/15   [place] Jury A  Fletcher Anon, MD  Nutritional Supplements (FEEDING SUPPLEMENT, NEPRO CARB STEADY,) LIQD Take 237 mLs by mouth daily.    Historical Provider, MD  ondansetron (ZOFRAN) 4 MG tablet Take 4 mg by mouth every 6 (six) hours as needed for nausea or vomiting (Nausea).    Historical Provider, MD  ondansetron (ZOFRAN) 4 MG tablet Take 1 tablet (4 mg total) by mouth every 8 (eight) hours as needed for nausea or vomiting. Patient not taking: Reported on 02/07/2016 12/26/15   Nance Pear, MD  oxyCODONE (ROXICODONE) 5 MG immediate release tablet 1-2 tablets every 6 hours as needed for severe pain Patient not taking: Reported on 02/07/2016 10/21/15   Lisa Roca, MD  oxyCODONE-acetaminophen (PERCOCET) 7.5-325 MG tablet Take 1 tablet by mouth every 4 (four) hours as needed for severe pain. 01/31/16   Algernon Huxley, MD  pantoprazole (PROTONIX) 20 MG tablet Take 20 mg by mouth daily.    Historical  Provider, MD  pregabalin (LYRICA) 75 MG capsule Take 75 mg by mouth 2 (two) times daily.    Historical Provider, MD  sevelamer carbonate (RENVELA) 800 MG tablet Take 800 mg by mouth 3 (three) times daily with meals.    Historical Provider, MD  simvastatin (ZOCOR) 10 MG tablet Take 2 tablets by mouth every evening. Change in therapy 12/23/14   Historical Provider, MD  sucralfate (CARAFATE) 1 g tablet Take 1 tablet (1 g total) by mouth 4 (four) times daily. Patient not taking: Reported on 02/07/2016 12/26/15   Nance Pear, MD  tiotropium (SPIRIVA) 18 MCG inhalation capsule Place 18 mcg into inhaler and inhale daily.    Historical Provider, MD  triamcinolone cream (KENALOG) 0.1 % Apply 1 application topically daily as needed. 11/07/14   Historical Provider, MD    Allergies Dust mite extract  Family History  Problem Relation Age of Onset  . Heart failure Other   . Hypertension Other   . Leukemia Other   . Diabetes Other     Social History Social History  Substance Use Topics  . Smoking status: Current Every Day Smoker    Packs/day: 0.25    Types: Cigarettes  . Smokeless tobacco: Never Used     Comment: 5  . Alcohol use No    Review of Systems Constitutional: No fever/chills Eyes: No visual changes. ENT: No sore throat. Cardiovascular: Denies chest pain. Respiratory: Denies shortness of breath. Gastrointestinal: No abdominal pain.  No nausea, no vomiting.  No diarrhea.  No constipation. Genitourinary: Negative for dysuria. Musculoskeletal: Negative for back pain. Skin: Negative for rash.  10-point ROS otherwise negative.  ____________________________________________   PHYSICAL EXAM:  VITAL SIGNS: ED Triage Vitals  Enc Vitals Group     BP 02/17/16 1439 128/86     Pulse Rate 02/17/16 1439 71     Resp 02/17/16 1439 18     Temp 02/17/16 1439 97.6 F (36.4 C)     Temp src --      SpO2 02/17/16 1439 97 %     Weight 02/17/16 1439 177 lb (80.3 kg)     Height 02/17/16  1439 6\' 1"  (1.854 m)     Head Circumference --      Peak Flow --      Pain Score 02/17/16 1440 0     Pain Loc --      Pain Edu? --      Excl. in [place]? --     Constitutional: Alert and oriented. Well appearing and in no acute distress. Eyes: Conjunctivae  are normal. PERRL. EOMI. Head: Atraumatic. Nose: No congestion/rhinnorhea. Mouth/Throat: Mucous membranes are moist.  Oropharynx non-erythematous. Neck: No stridor.   Cardiovascular: Normal rate, regular rhythm.   Good peripheral circulation. Respiratory: Normal respiratory effort.  No retractions. Mar[place] Kitchenastrointestinal: Soft and nontender. No distention.  Musculoskeletal: No lower extremity tenderness nor edema.  No joint effusions.  ____________________________________________   LABS (all labs ordered are listed, but only abnormal results are displayed)  Labs Reviewed  CBC WITH DIFFERENTIAL/PLATELET  COMPREHENSIVE METABOLIC PANEL   ____________________________________________  EKG   ____________________________________________  RADIOLOGY   ____________________________________________   PROCEDURES  Procedure(s) performed:  Procedures  Critical Care performed:   ____________________________________________   INITIAL IMPRESSION / ASSESSMENT AND PLAN / ED COURSE  Pertinent labs & imaging results that were available during my care of the patient were reviewed by me and considered in my medical decision making (see chart for details).   Clinical Course     ____________________________________________   FINAL CLINICAL IMPRESSION(S) / ED DIAGNOSES  Final diagnoses:  Bleeding      NEW MEDICATIONS STARTED DURING THIS VISIT:  New Prescriptions   No medications on file     Note:  This document was prepared using Dragon voice recognition software and may include unintentional dictation errors.    Nena Polio, MD 02/17/16 [phone]

## 2016-02-17 NOTE — Anesthesia Procedure Notes (Signed)
Procedure Name: LMA Insertion Date/Time: 02/17/2016 6:25 PM Performed by: Eliberto Ivory Pre-anesthesia Checklist: Patient identified, Patient being monitored, Timeout performed, Emergency Drugs available and Suction available Patient Re-evaluated:Patient Re-evaluated prior to inductionOxygen Delivery Method: Circle system utilized Preoxygenation: Pre-oxygenation with 100% oxygen Intubation Type: IV induction Ventilation: Mask ventilation without difficulty LMA: LMA inserted LMA Size: 4.0 Tube type: Oral Number of attempts: 1 Placement Confirmation: positive ETCO2 and breath sounds checked- equal and bilateral Tube secured with: Tape Dental Injury: Teeth and Oropharynx as per pre-operative assessment

## 2016-02-17 NOTE — ED Notes (Signed)
Bleeding from fistula controlled with pressure bandage.

## 2016-02-17 NOTE — H&P (Signed)
Patient name: [name] MRN: [id number] DOB: 03-24-1956 Sex: male  REASON FOR VISIT:  Bleeding right arm fistula  HPI: [name] is a 60 y.o. male who presented to the ED with bleeding from his right arm fistula.   He states that it was worked on at [facility] approximately 1 month ago for infection.  A catheter was placed.  He does not know any more details.  No current facility-administered medications for this encounter.    Current Outpatient Prescriptions  Medication Sig Dispense Refill  . acetaminophen (TYLENOL) 500 MG tablet Take 1,000 mg by mouth every 6 (six) hours as needed for moderate pain.    [name] albuterol (PROVENTIL HFA;VENTOLIN HFA) 108 (90 BASE) MCG/ACT inhaler Inhale 1 puff into the lungs every 6 (six) hours as needed for wheezing or shortness of breath.    [name] aspirin 325 MG tablet Take 325 mg by mouth daily.     [name] atorvastatin (LIPITOR) 10 MG tablet Take 10 mg by mouth daily.    . budesonide-formoterol (SYMBICORT) 160-4.5 MCG/ACT inhaler Inhale 2 puffs into the lungs 2 (two) times daily. Reported on 08/14/2015    . calcium acetate (PHOSLO) 667 MG capsule Take 667 mg by mouth 3 (three) times daily with meals.    . docusate sodium (COLACE) 100 MG capsule Take 100 mg by mouth 2 (two) times daily. Reported on 08/14/2015    . ipratropium-albuterol (DUONEB) 0.5-2.5 (3) MG/3ML SOLN Take 3 mLs by nebulization every 4 (four) hours as needed (SOB).    . midodrine (PROAMATINE) 5 MG tablet Take 5 mg by mouth. 3 times a week, 30 minutes before dialysis    . Nutritional Supplements (FEEDING SUPPLEMENT, NEPRO CARB STEADY,) LIQD Take 237 mLs by mouth daily.    . ondansetron (ZOFRAN) 4 MG tablet Take 4 mg by mouth every 6 (six) hours as needed for nausea or vomiting (Nausea).    . pantoprazole (PROTONIX) 20 MG tablet Take 20 mg by mouth daily.    . pregabalin (LYRICA) 75 MG capsule Take 75 mg by mouth 2 (two) times daily.    . sevelamer carbonate (RENVELA)  800 MG tablet Take 800 mg by mouth 3 (three) times daily with meals.    . tiotropium (SPIRIVA) 18 MCG inhalation capsule Place 18 mcg into inhaler and inhale daily.    [name] triamcinolone cream (KENALOG) 0.1 % Apply 1 application topically daily as needed.    . Darbepoetin Alfa (ARANESP) 40 MCG/0.4ML SOSY injection Inject 0.4 mLs (40 mcg total) into the vein every Monday with hemodialysis. 8.4 mL   . doxercalciferol (HECTOROL) 4 MCG/2ML injection Inject 1.25 mLs (2.5 mcg total) into the vein every Monday, Wednesday, and Friday with hemodialysis. 2 mL   . ferric gluconate 125 mg in sodium chloride 0.9 % 100 mL Inject 125 mg into the vein every Monday, Wednesday, and Friday with hemodialysis.    [name] gabapentin (NEURONTIN) 300 MG capsule Take 1 capsule by mouth at bedtime.    [name] ibuprofen (ADVIL,MOTRIN) 600 MG tablet Take 600 mg by mouth every 6 (six) hours as needed.    . metoprolol tartrate (LOPRESSOR) 25 MG tablet Take 25 mg by mouth 2 (two) times daily.      . multivitamin (RENA-VIT) TABS tablet Take 1 tablet by mouth at bedtime.  0  . nicotine (NICODERM CQ - DOSED IN MG/24 HOURS) 14 mg/24hr patch Place 1 patch (14 mg total) onto the skin daily. (Patient not taking: Reported on 02/17/2016) 30 patch 0  .  ondansetron (ZOFRAN) 4 MG tablet Take 1 tablet (4 mg total) by mouth every 8 (eight) hours as needed for nausea or vomiting. (Patient not taking: Reported on 02/17/2016) 20 tablet 0  . oxyCODONE (ROXICODONE) 5 MG immediate release tablet 1-2 tablets every 6 hours as needed for severe pain (Patient not taking: Reported on 02/17/2016) 15 tablet 0  . oxyCODONE-acetaminophen (PERCOCET) 7.5-325 MG tablet Take 1 tablet by mouth every 4 (four) hours as needed for severe pain. 30 tablet 0  . simvastatin (ZOCOR) 10 MG tablet Take 2 tablets by mouth every evening. Change in therapy    . sucralfate (CARAFATE) 1 g tablet Take 1 tablet (1 g total) by mouth 4 (four) times daily. (Patient not taking: Reported on 02/17/2016) 60  tablet 0    REVIEW OF SYSTEMS:  [X]  denotes positive finding, [ ]  denotes negative finding Cardiac  Comments:  Chest pain or chest pressure:    Shortness of breath upon exertion:    Short of breath when lying flat:    Irregular heart rhythm:    Constitutional    Fever or chills:      PHYSICAL EXAM: Vitals:   02/17/16 1439 02/17/16 1532 02/17/16 1700  BP: 128/86 119/80 115/79  Pulse: 71 74 75  Resp: 18 18 18   Temp: 97.6 F (36.4 C)  97.5 F (36.4 C)  SpO2: 97% 98% 98%  Weight: 177 lb (80.3 kg)    Height: 6\' 1"  (1.854 m)      GENERAL: The patient is a well-nourished male, in no acute distress. The vital signs are documented above. CARDIOVASCULAR: There is a regular rate and rhythm. PULMONARY: There is good air exchange bilaterally without wheezing or rales. His fistula appears occluded as there is not a thrill, however there does appear to be a patent proximal segment of the fistula with a large ulcer and stigmata of recent bleec  MEDICAL ISSUES: From what I can tell, he underwent ligation of his fistula with a segment of approximately 3 cm that remains patent which is where he bled.  I proposed ligating the fistula at the origin and resecting the ulcerated area.  He agrees and wants to procveed  Annamarie Major, MD [facility] [phone] Pager [phone]

## 2016-02-18 NOTE — Op Note (Signed)
Patient name: [name] MRN: [id number] DOB: 07-Feb-1956 Sex: male  02/17/2016 Pre-operative Diagnosis: Bleeding right upper arm dialysis graft Post-operative diagnosis:  Same Surgeon:  Annamarie Major Assistants:  None Procedure:   Removal of right upper arm dialysis graft Anesthesia:  Gen. Blood Loss:  See anesthesia record Specimens:  Wound cultures were sent  Findings:  The graft was hot incorporated, suggesting infection.  The graft was also occluded.  I removed the graft in its entirety.  There did appear to be a vein cuff on the brachial artery likely from a previous procedure therefore I oversewed this at the level of the brachial artery.  Indications:  The patient presented to the emergency department with bleeding from his dialysis graft.  When I saw him there was no thrill within the graft.  He still had a pulse in the 2-3 cm proximal portion of the graft with stigmata of recent bleed.  Therefore we decided to proceed to the operating room for exploration and likely removal of infected graft  Procedure:  The patient was identified in the holding area and taken to [place] [facility]  The patient was then placed supine on the table. general anesthesia was administered.  The patient was prepped and draped in the usual sterile fashion.  A time out was called and antibiotics were administered.  The tourniquet was placed on the upper arm which was intermittently inflated.  I initially made an elliptical incision around the ulcerated skin and remove this exposing the bovine graft.  It was clear at this point that the graft was infected as it was not incorporated at all.  Wound cultures were sent.  Realizing that I was going to take the graft out, I made a more proximal incision near the arterial to graft anastomosis.  Through this incision circumferentially exposed the graft which again was not well incorporated.  I then dissected more proximal towards the brachial artery.  I found multiple  suture lines from previous anastomosis.  I got to where I felt I was down to a vein cuff to the brachial artery and was able to remove the bovine graft in its entirety.  I oversewed the vein at this level with 5-0 Prolene in 2 layers.  There was proximally 0.5 cm of the vein cuff on the artery.  At this point I evaluated the radial artery with Doppler signal and it had a triphasic signal.  Next I turned my attention towards the shoulder.  Previous incision was opened.  I exposed the graft which was easy as it was not incorporated.  I identified the anastomosis between the bovine graft and the residual cephalic vein.  I dissected proximally on the cephalic vein until I got to healthy vein and transected it at this level and oversewed the vein with 2 layers of running 5-0 Prolene.  The graft was then easily removed from its tunnel.  The wound was then irrigated.  The incision where the artery was exposed, was closed with 2 layers of 3-0 Vicryl.  The shoulder incision was closed with 2 layers of 3-0 Vicryl.  Dermabond was placed on these incisions.  The incision or the elliptical area of skin was removed was reapproximated on the edges of the incision with 2-0 nylon vertical mattress suture.  The remaining central portion of the wound was packed with gauze.  Kerlix and Ace wraps were placed.  There were no immediate complications.   Disposition:  To PACU in stable condition.  Theotis Burrow, M.D. [facility] [place] Office: [phone] Pager:  [phone]

## 2016-02-18 NOTE — ED Notes (Signed)
Single 4x4 placed for wound coverage. No overt bleeding noted

## 2016-02-19 ENCOUNTER — Encounter: Payer: Self-pay | Admitting: Surgery

## 2016-02-20 NOTE — Anesthesia Postprocedure Evaluation (Signed)
Anesthesia Post Note  Patient: [name]  Procedure(s) Performed: Procedure(s) (LRB): removal of AV fistula (Right)  Patient location during evaluation: PACU Anesthesia Type: General Level of consciousness: awake and alert Pain management: pain level controlled Vital Signs Assessment: post-procedure vital signs reviewed and stable Respiratory status: spontaneous breathing, nonlabored ventilation, respiratory function stable and patient connected to nasal cannula oxygen Cardiovascular status: blood pressure returned to baseline and stable Postop Assessment: no signs of nausea or vomiting Anesthetic complications: no    Last Vitals:  Vitals:   02/17/16 2132 02/17/16 2223  BP: 120/82 123/81  Pulse: 67 64  Resp: 16 18  Temp: 36.9 C 36.8 C    Last Pain:  Vitals:   02/17/16 2223  TempSrc: Temporal  PainSc: 1                  [name]

## 2016-02-23 LAB — AEROBIC/ANAEROBIC CULTURE (SURGICAL/DEEP WOUND)

## 2016-02-23 LAB — AEROBIC/ANAEROBIC CULTURE W GRAM STAIN (SURGICAL/DEEP WOUND)

## 2016-03-04 ENCOUNTER — Encounter: Payer: Self-pay | Admitting: Vascular Surgery

## 2016-03-04 ENCOUNTER — Other Ambulatory Visit (INDEPENDENT_AMBULATORY_CARE_PROVIDER_SITE_OTHER): Payer: Self-pay | Admitting: Vascular Surgery

## 2016-03-04 DIAGNOSIS — I739 Peripheral vascular disease, unspecified: Secondary | ICD-10-CM

## 2016-03-04 DIAGNOSIS — M79604 Pain in right leg: Secondary | ICD-10-CM

## 2016-03-05 ENCOUNTER — Encounter (INDEPENDENT_AMBULATORY_CARE_PROVIDER_SITE_OTHER): Payer: Self-pay

## 2016-03-05 ENCOUNTER — Encounter (INDEPENDENT_AMBULATORY_CARE_PROVIDER_SITE_OTHER): Payer: Self-pay | Admitting: Vascular Surgery

## 2016-03-05 ENCOUNTER — Other Ambulatory Visit (INDEPENDENT_AMBULATORY_CARE_PROVIDER_SITE_OTHER): Payer: Self-pay | Admitting: Vascular Surgery

## 2016-03-05 ENCOUNTER — Ambulatory Visit (INDEPENDENT_AMBULATORY_CARE_PROVIDER_SITE_OTHER): Payer: Medicare Other

## 2016-03-05 ENCOUNTER — Ambulatory Visit (INDEPENDENT_AMBULATORY_CARE_PROVIDER_SITE_OTHER): Payer: Medicare Other | Admitting: Vascular Surgery

## 2016-03-05 VITALS — BP 148/92 | HR 74 | Resp 16 | Ht 74.5 in | Wt 150.0 lb

## 2016-03-05 DIAGNOSIS — I739 Peripheral vascular disease, unspecified: Secondary | ICD-10-CM | POA: Diagnosis not present

## 2016-03-05 DIAGNOSIS — I2510 Atherosclerotic heart disease of native coronary artery without angina pectoris: Secondary | ICD-10-CM

## 2016-03-05 DIAGNOSIS — N186 End stage renal disease: Secondary | ICD-10-CM

## 2016-03-05 DIAGNOSIS — M79604 Pain in right leg: Secondary | ICD-10-CM

## 2016-03-05 DIAGNOSIS — Y841 Kidney dialysis as the cause of abnormal reaction of the patient, or of later complication, without mention of misadventure at the time of the procedure: Secondary | ICD-10-CM | POA: Diagnosis not present

## 2016-03-05 DIAGNOSIS — I10 Essential (primary) hypertension: Secondary | ICD-10-CM | POA: Diagnosis not present

## 2016-03-05 NOTE — Assessment & Plan Note (Signed)
blood pressure control important in reducing the progression of atherosclerotic disease. On appropriate oral medications.  

## 2016-03-05 NOTE — Patient Instructions (Signed)
AV Fistula, Care After Refer to this sheet in the next few weeks. These instructions provide you with information on caring for yourself after your procedure. Your caregiver may also give you more specific instructions. Your treatment has been planned according to current medical practices, but problems sometimes occur. Call your caregiver if you have any problems or questions after your procedure. HOME CARE INSTRUCTIONS   Do not drive a car or take public transportation alone.  Do not drink alcohol.  Only take medicine that has been prescribed by your caregiver.  Do not sign important papers or make important decisions.  Have a responsible person with you.  Ask your caregiver to show you how to check your access at home for a vibration (called a "thrill") or for a sound (called a "bruit" pronounced brew-ee).  Your vein will need time to enlarge and mature so needles can be inserted for dialysis. Follow your caregiver's instructions about what you need to do to make this happen.  Keep dressings clean and dry.  Keep the arm elevated above your heart. Use a pillow.  Rest.  Use the arm as usual for all activities.  Have the stitches or tape closures removed in 10 to 14 days, or as directed by your caregiver.  Do not sleep or lie on the area of the fistula or that arm. This may decrease or stop the blood flow through your fistula.  Do not allow blood pressures to be taken on this arm.  Do not allow blood drawing to be done from the graft.  Do not wear tight clothing around the access site or on the arm.  Avoid lifting heavy objects with the arm that has the fistula.  Do not use creams or lotions over the access site. SEEK MEDICAL CARE IF:   You have a fever.  You have swelling around the fistula that gets worse, or you have new pain.  You have unusual bleeding at the fistula site or from any other area.  You have pus or other drainage at the fistula site.  You have skin  redness or red streaking on the skin around, above, or below the fistula site.  Your access site feels warm.  You have any flu-like symptoms. SEEK IMMEDIATE MEDICAL CARE IF:   You have pain, numbness, or an unusual pale skin on the hand or on the side of your fistula.  You have dizziness or weakness that you have not had before.  You have shortness of breath.  You have chest pain.  Your fistula disconnects or breaks, and there is bleeding that cannot be easily controlled. Call for local emergency medical help. Do not try to drive yourself to the hospital. MAKE SURE YOU  Understand these instructions.  Will watch your condition.  Will get help right away if you are not doing well or get worse.   This information is not intended to replace advice given to you by your health care provider. Make sure you discuss any questions you have with your health care provider.   Document Released: 05/13/2005 Document Revised: 06/03/2014 Document Reviewed: 10/31/2010 Elsevier Interactive Patient Education 2016 [employer]  

## 2016-03-05 NOTE — Assessment & Plan Note (Signed)
  The patient's noninvasive studies today demonstrate a noncompressible ABI but he still has a good waveform on the right foot. 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 We will plan to recheck his perfusion in 6 months as no intervention is currently required.

## 2016-03-05 NOTE — Progress Notes (Signed)
MRN : [id number]  [name] is a 60 y.o. (1956/04/30) male who presents with chief complaint of  Chief Complaint  Patient presents with  . Re-evaluation    Follow up  .  History of Present Illness: Patient returns today in follow up of Multiple vascular issues. He has a scheduled visit today for his known peripheral vascular disease. His right transmetatarsal amputation remains healed. He has a stable and known left below-knee amputation. The patient's noninvasive studies today demonstrate a noncompressible ABI but he still has a good waveform on the right foot. He had an infected graft removed from his right arm and the wound is almost completely healed at this point. He is currently using a catheter and will need a permanent dialysis access on the left arm soon.  Current Outpatient Prescriptions  Medication Sig Dispense Refill  . acetaminophen (TYLENOL) 500 MG tablet Take 1,000 mg by mouth every 6 (six) hours as needed for moderate pain.    [name] albuterol (PROVENTIL HFA;VENTOLIN HFA) 108 (90 BASE) MCG/ACT inhaler Inhale 1 puff into the lungs every 6 (six) hours as needed for wheezing or shortness of breath.    [name] aspirin 325 MG tablet Take 325 mg by mouth daily.     [name] atorvastatin (LIPITOR) 10 MG tablet Take 10 mg by mouth daily.    . budesonide-formoterol (SYMBICORT) 160-4.5 MCG/ACT inhaler Inhale 2 puffs into the lungs 2 (two) times daily. Reported on 08/14/2015    . calcium acetate (PHOSLO) 667 MG capsule Take 667 mg by mouth 3 (three) times daily with meals.    . Darbepoetin Alfa (ARANESP) 40 MCG/0.4ML SOSY injection Inject 0.4 mLs (40 mcg total) into the vein every Monday with hemodialysis. 8.4 mL   . docusate sodium (COLACE) 100 MG capsule Take 100 mg by mouth 2 (two) times daily. Reported on 08/14/2015    . doxercalciferol (HECTOROL) 4 MCG/2ML injection Inject 1.25 mLs (2.5 mcg total) into the vein every Monday, Wednesday, and Friday with hemodialysis. 2 mL   . ferric gluconate  125 mg in sodium chloride 0.9 % 100 mL Inject 125 mg into the vein every Monday, Wednesday, and Friday with hemodialysis.    [name] gabapentin (NEURONTIN) 300 MG capsule Take 1 capsule by mouth at bedtime.    [name] ipratropium-albuterol (DUONEB) 0.5-2.5 (3) MG/3ML SOLN Take 3 mLs by nebulization every 4 (four) hours as needed (SOB).    . metoprolol tartrate (LOPRESSOR) 25 MG tablet Take 25 mg by mouth 2 (two) times daily.      . midodrine (PROAMATINE) 5 MG tablet Take 5 mg by mouth. 3 times a week, 30 minutes before dialysis    . multivitamin (RENA-VIT) TABS tablet Take 1 tablet by mouth at bedtime.  0  . Nutritional Supplements (FEEDING SUPPLEMENT, NEPRO CARB STEADY,) LIQD Take 237 mLs by mouth daily.    [name] oxyCODONE-acetaminophen (PERCOCET) 7.5-325 MG tablet Take 1 tablet by mouth every 4 (four) hours as needed for severe pain. 30 tablet 0  . pantoprazole (PROTONIX) 20 MG tablet Take 20 mg by mouth daily.    . pregabalin (LYRICA) 75 MG capsule Take 75 mg by mouth 2 (two) times daily.    . sevelamer carbonate (RENVELA) 800 MG tablet Take 800 mg by mouth 3 (three) times daily with meals.    . simvastatin (ZOCOR) 10 MG tablet Take 2 tablets by mouth every evening. Change in therapy    . tiotropium (SPIRIVA) 18 MCG inhalation capsule Place 18 mcg into inhaler  and inhale daily.    [name] triamcinolone cream (KENALOG) 0.1 % Apply 1 application topically daily as needed.     No current facility-administered medications for this visit.     Past Medical History:  Diagnosis Date  . Anemia   . Arthritis   . Asthma   . CHF (congestive heart failure) ([place])   . Chronic systolic heart failure ([place])   . Complication of anesthesia    hypotension  . COPD (chronic obstructive pulmonary disease) ([place])   . Coronary artery disease   . Diabetes mellitus without complication ([place])   . Dialysis patient ([place])    Mon, Wed, Fri  . End stage renal disease ([place])   . GERD (gastroesophageal reflux disease)   . Headache   .  HLD (hyperlipidemia)   . HTN (hypertension)   . Hyperparathyroidism   . Myocardial infarction   . Peripheral vascular disease ([place])   . Shortness of breath dyspnea   . Sleep apnea   . Tobacco dependence     Past Surgical History:  Procedure Laterality Date  . AMPUTATION Left 05/06/2014   Procedure: AMPUTATION BELOW KNEE;  Surgeon: Elam Dutch, MD;  Location: [facility];  Service: Vascular;  Laterality: Left;  . AMPUTATION Right 01/12/2015   Procedure: Foot transmetatarsal amputation;  Surgeon: Algernon Huxley, MD;  Location: [facility];  Service: Vascular;  Laterality: Right;  . APPLICATION OF WOUND VAC Right 03/01/2015   Procedure: Application of Bio-connekt graft and wound vac application to right foot ;  Surgeon: Algernon Huxley, MD;  Location: [facility];  Service: Vascular;  Laterality: Right;  . AV FISTULA PLACEMENT Left   . CARDIAC CATHETERIZATION     stent placement   . CORONARY ANGIOPLASTY    . LIGATION OF ARTERIOVENOUS  FISTULA Right 01/31/2016   Procedure: LIGATION OF ARTERIOVENOUS  FISTULA;  Surgeon: Algernon Huxley, MD;  Location: [facility];  Service: Vascular;  Laterality: Right;  . PERIPHERAL VASCULAR CATHETERIZATION Right 12/15/2014   Procedure: Lower Extremity Angiography;  Surgeon: Algernon Huxley, MD;  Location: [facility];  Service: Cardiovascular;  Laterality: Right;  . PERIPHERAL VASCULAR CATHETERIZATION  12/15/2014   Procedure: Lower Extremity Intervention;  Surgeon: Algernon Huxley, MD;  Location: [facility];  Service: Cardiovascular;;  . PERIPHERAL VASCULAR CATHETERIZATION Right 08/14/2015   Procedure: A/V Shuntogram/Fistulagram;  Surgeon: Algernon Huxley, MD;  Location: [facility];  Service: Cardiovascular;  Laterality: Right;  . PERIPHERAL VASCULAR CATHETERIZATION N/A 08/14/2015   Procedure: A/V Shunt Intervention;  Surgeon: Algernon Huxley, MD;  Location: [facility];  Service: Cardiovascular;  Laterality: N/A;  . PERIPHERAL VASCULAR CATHETERIZATION N/A  01/11/2016   Procedure: Dialysis/Perma Catheter Insertion;  Surgeon: Algernon Huxley, MD;  Location: [facility];  Service: Cardiovascular;  Laterality: N/A;  . REVISON OF ARTERIOVENOUS FISTULA Right 02/17/2016   Procedure: removal of AV fistula;  Surgeon: Serafina Mitchell, MD;  Location: [facility];  Service: Vascular;  Laterality: Right;  . REVISON OF ARTERIOVENOUS FISTULA Right 01/31/2016   Procedure: REVISON OF ARTERIOVENOUS FISTULA ( BRACHIOCEPHALIC ) W/ ARTEGRAFT;  Surgeon: Algernon Huxley, MD;  Location: [facility];  Service: Vascular;  Laterality: Right;  . TRANSMETATARSAL AMPUTATION Right 05/04/2015   Procedure: TRANSMETATARSAL AMPUTATION REVISION, great toe amputation;  Surgeon: Algernon Huxley, MD;  Location: [facility];  Service: Vascular;  Laterality: Right;    Social History Social History  Substance Use Topics  . Smoking status: Current  Every Day Smoker    Packs/day: 0.25    Types: Cigarettes  . Smokeless tobacco: Never Used     Comment: 5  . Alcohol use No    Family History Family History  Problem Relation Age of Onset  . Heart failure Other   . Hypertension Other   . Leukemia Other   . Diabetes Other   NO bleeding or clotting disorders  Allergies  Allergen Reactions  . Dust Mite Extract      REVIEW OF SYSTEMS (Negative unless checked)  Constitutional: []Weight loss  []Fever  []Chills Cardiac: []Chest pain   []Chest pressure   []Palpitations   []Shortness of breath when laying flat   []Shortness of breath at rest   []Shortness of breath with exertion. Vascular:  []Pain in legs with walking   []Pain in legs at rest   []Pain in legs when laying flat   []Claudication   []Pain in feet when walking  []Pain in feet at rest  []Pain in feet when laying flat   []History of DVT   []Phlebitis   []Swelling in legs   []Varicose veins   [x]Non-healing ulcers Pulmonary:   []Uses home oxygen   []Productive cough   []Hemoptysis   []Wheeze  []COPD   []Asthma Neurologic:  []Dizziness   []Blackouts   []Seizures   []History of stroke   []History of TIA  []Aphasia   []Temporary blindness   []Dysphagia   []Weakness or numbness in arms   []Weakness or numbness in legs Musculoskeletal:  []Arthritis   []Joint swelling   []Joint pain   []Low back pain Hematologic:  []Easy bruising  []Easy bleeding   []Hypercoagulable state   []Anemic   Gastrointestinal:  []Blood in stool   []Vomiting blood  []Gastroesophageal reflux/heartburn   []Abdominal pain Genitourinary:  []Chronic kidney disease   []Difficult urination  []Frequent urination  []Burning with urination   []Hematuria Skin:  []Rashes   []Ulcers   [x]Wounds Psychological:  []History of anxiety   [] History of major depression.  Physical Examination  BP (!) 148/92 (BP Location: Right Arm)   Pulse 74   Resp 16   Ht 6' 2.5" (1.892 m)   Wt 150 lb (68 kg)   BMI 19.00 kg/m  Gen:  WD/WN, NAD Head: [place]/AT, No temporalis wasting. Ear/Nose/Throat: Hearing grossly intact, nares w/o erythema or drainage, trachea midline Eyes: PERRLA, EOMI. Sclera non-icteric Neck: Supple, no nuchal rigidity.  No JVD.  Pulmonary:  Good air movement, no use of accessory muscles.  Cardiac: RRR, normal S1, S2 Vascular: Dialysis catheter without signs of erythema or drainage. Visible cephalic vein at the antecubital fossa on the left. Right arm wound with only a minimal opening with good granulation tissue. Vessel Right Left  Radial Palpable Palpable  Ulnar Palpable Palpable  Brachial Palpable Palpable  Carotid Palpable, without bruit Palpable, without bruit  Aorta Not palpable N/A  Femoral Palpable Palpable  Popliteal Palpable Not Palpable  PT 1+ Palpable Not Palpable  DP Not Palpable Not Palpable   Gastrointestinal: soft, non-tender/non-distended. No guarding/reflex.  Musculoskeletal: M/S 5/5 throughout.  Left below-knee amputation prosthesis in place. Right transmetatarsal amputation healed. Neurologic: CN 2-12 intact. Pain and light touch intact in  extremities.  Symmetrical.  Speech is fluent.  Psychiatric: Judgment intact, Mood & affect appropriate for pt's clinical situation. Dermatologic: No rashes or ulcers noted.  No cellulitis or open wounds. Lymph : No Cervical, Axillary, or Inguinal lymphadenopathy.      Labs Recent Results (from the  past 2160 hour(s))  CBC with Differential     Status: Abnormal   Collection Time: 12/26/15  3:06 PM  Result Value Ref Range   WBC 5.2 3.8 - 10.6 K/uL   RBC 4.04 (L) 4.40 - 5.90 MIL/uL   Hemoglobin 12.9 (L) 13.0 - 18.0 g/dL   HCT 38.6 (L) 40.0 - 52.0 %   MCV 95.6 80.0 - 100.0 fL   MCH 31.9 26.0 - 34.0 pg   MCHC 33.4 32.0 - 36.0 g/dL   RDW 16.5 (H) 11.5 - 14.5 %   Platelets 106 (L) 150 - 440 K/uL   Neutrophils Relative % 58 %   Neutro Abs 3.0 1.4 - 6.5 K/uL   Lymphocytes Relative 23 %   Lymphs Abs 1.2 1.0 - 3.6 K/uL   Monocytes Relative 13 %   Monocytes Absolute 0.7 0.2 - 1.0 K/uL   Eosinophils Relative 4 %   Eosinophils Absolute 0.2 0 - 0.7 K/uL   Basophils Relative 2 %   Basophils Absolute 0.1 0 - 0.1 K/uL  Comprehensive metabolic panel     Status: Abnormal   Collection Time: 12/26/15  3:06 PM  Result Value Ref Range   Sodium 133 (L) 135 - 145 mmol/L   Potassium 5.8 (H) 3.5 - 5.1 mmol/L   Chloride 95 (L) 101 - 111 mmol/L   CO2 27 22 - 32 mmol/L   Glucose, Bld 93 65 - 99 mg/dL   BUN 62 (H) 6 - 20 mg/dL   Creatinine, Ser 8.50 (H) 0.61 - 1.24 mg/dL   Calcium 9.5 8.9 - 10.3 mg/dL   Total Protein 7.9 6.5 - 8.1 g/dL   Albumin 3.5 3.5 - 5.0 g/dL   AST 34 15 - 41 U/L   ALT 28 17 - 63 U/L   Alkaline Phosphatase 74 38 - 126 U/L   Total Bilirubin 0.9 0.3 - 1.2 mg/dL   GFR calc non Af Amer 6 (L) >60 mL/min   GFR calc Af Amer 7 (L) >60 mL/min    Comment: (NOTE) The eGFR has been calculated using the CKD EPI equation. This calculation has not been validated in all clinical situations. eGFR's persistently <60 mL/min signify possible Chronic Kidney Disease.    Anion gap 11 5 - 15    Lipase, blood     Status: None   Collection Time: 12/26/15  3:06 PM  Result Value Ref Range   Lipase 44 11 - 51 U/L  Potassium [facility] vascular lab only)     Status: Abnormal   Collection Time: 01/11/16  7:54 AM  Result Value Ref Range   Potassium [facility] vascular lab) 6.0 (H) 3.5 - 5.1  APTT     Status: None   Collection Time: 01/17/16  2:55 PM  Result Value Ref Range   aPTT 34 24 - 36 seconds  Basic metabolic panel     Status: Abnormal   Collection Time: 01/17/16  2:55 PM  Result Value Ref Range   Sodium 133 (L) 135 - 145 mmol/L   Potassium 5.5 (H) 3.5 - 5.1 mmol/L   Chloride 95 (L) 101 - 111 mmol/L   CO2 27 22 - 32 mmol/L   Glucose, Bld 87 65 - 99 mg/dL   BUN 55 (H) 6 - 20 mg/dL   Creatinine, Ser 5.58 (H) 0.61 - 1.24 mg/dL   Calcium 9.1 8.9 - 10.3 mg/dL   GFR calc non Af Amer 10 (L) >60 mL/min   GFR calc Af Amer 11 (L) >60 mL/min  Comment: (NOTE) The eGFR has been calculated using the CKD EPI equation. This calculation has not been validated in all clinical situations. eGFR's persistently <60 mL/min signify possible Chronic Kidney Disease.    Anion gap 11 5 - 15  CBC WITH DIFFERENTIAL     Status: Abnormal   Collection Time: 01/17/16  2:55 PM  Result Value Ref Range   WBC 5.4 3.8 - 10.6 K/uL   RBC 3.87 (L) 4.40 - 5.90 MIL/uL   Hemoglobin 12.6 (L) 13.0 - 18.0 g/dL   HCT 37.4 (L) 40.0 - 52.0 %   MCV 96.7 80.0 - 100.0 fL   MCH 32.5 26.0 - 34.0 pg   MCHC 33.6 32.0 - 36.0 g/dL   RDW 15.1 (H) 11.5 - 14.5 %   Platelets 134 (L) 150 - 440 K/uL   Neutrophils Relative % 50 %   Neutro Abs 2.7 1.4 - 6.5 K/uL   Lymphocytes Relative 27 %   Lymphs Abs 1.4 1.0 - 3.6 K/uL   Monocytes Relative 17 %   Monocytes Absolute 0.9 0.2 - 1.0 K/uL   Eosinophils Relative 5 %   Eosinophils Absolute 0.3 0 - 0.7 K/uL   Basophils Relative 1 %   Basophils Absolute 0.1 0 - 0.1 K/uL  Protime-INR     Status: Abnormal   Collection Time: 01/17/16  2:55 PM  Result Value Ref Range   Prothrombin Time  15.5 (H) 11.4 - 15.2 seconds   INR 1.22   Surgical pcr screen     Status: Abnormal   Collection Time: 01/17/16  2:55 PM  Result Value Ref Range   MRSA, PCR POSITIVE (A) NEGATIVE    Comment: RESULT CALLED TO, READ BACK BY AND VERIFIED WITH: KAREN DICKSON AT 1700 ON 01/17/16...MSS    Staphylococcus aureus POSITIVE (A) NEGATIVE    Comment:        The Xpert SA Assay (FDA approved for NASAL specimens in patients over 39 years of age), is one component of a comprehensive surveillance program.  Test performance has been validated by [facility] for patients greater than or equal to 49 year old. It is not intended to diagnose infection nor to guide or monitor treatment. CRITICAL RESULT CALLED TO, READ BACK BY AND VERIFIED WITH: Lucina Mellow RN AT 1700 01/17/16 MSS   Type and screen     Status: None   Collection Time: 01/17/16  2:58 PM  Result Value Ref Range   ABO/RH(D) B POS    Antibody Screen NEG    Sample Expiration 01/31/2016    Extend sample reason NO TRANSFUSIONS OR PREGNANCY IN THE PAST 3 MONTHS   I-STAT 4, (NA,K, GLUC, HGB,HCT)     Status: Abnormal   Collection Time: 01/18/16  2:08 PM  Result Value Ref Range   Sodium 133 (L) 135 - 145 mmol/L   Potassium 6.0 (H) 3.5 - 5.1 mmol/L   Glucose, Bld 87 65 - 99 mg/dL   HCT 41.0 39.0 - 52.0 %   Hemoglobin 13.9 13.0 - 17.0 g/dL  Potassium     Status: Abnormal   Collection Time: 01/18/16  3:10 PM  Result Value Ref Range   Potassium 6.9 (HH) 3.5 - 5.1 mmol/L    Comment: CRITICAL RESULT CALLED TO, READ BACK BY AND VERIFIED WITH JANICE KENNEDY AT 1536 01/18/2016 BY TFK.   I-STAT 4, (NA,K, GLUC, HGB,HCT)     Status: Abnormal   Collection Time: 01/18/16  3:19 PM  Result Value Ref Range   Sodium 132 (  L) 135 - 145 mmol/L   Potassium 6.7 (HH) 3.5 - 5.1 mmol/L   Glucose, Bld 86 65 - 99 mg/dL   HCT 39.0 39.0 - 52.0 %   Hemoglobin 13.3 13.0 - 17.0 g/dL  Glucose, capillary     Status: None   Collection Time: 01/31/16  8:04 AM    Result Value Ref Range   Glucose-Capillary 70 65 - 99 mg/dL  Type and screen Kyle     Status: None   Collection Time: 01/31/16  8:09 AM  Result Value Ref Range   ABO/RH(D) B POS    Antibody Screen NEG    Sample Expiration 02/03/2016   I-STAT 4, (NA,K, GLUC, HGB,HCT)     Status: Abnormal   Collection Time: 01/31/16  8:19 AM  Result Value Ref Range   Sodium 135 135 - 145 mmol/L   Potassium 5.2 (H) 3.5 - 5.1 mmol/L   Glucose, Bld 83 65 - 99 mg/dL   HCT 38.0 (L) 39.0 - 52.0 %   Hemoglobin 12.9 (L) 13.0 - 17.0 g/dL  Surgical pathology     Status: None   Collection Time: 01/31/16 11:35 AM  Result Value Ref Range   SURGICAL PATHOLOGY      Surgical Pathology CASE: [id number] PATIENT: Colburn Geddes Surgical Pathology Report     SPECIMEN SUBMITTED: A. 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 PSEUDOANEURYSM CONTENTS AND STENT, RIGHT ARM AVF; REMOVAL: - FRAGMENT OF SCLEROTIC AND CALCIFIED BLOOD VESSEL WALL. - MURAL THROMBUS WITH FOCAL NEUTROPHILIC INFILTRATE. - STENT, 12.6 X 0.9 CM, GROSS ONLY.  B. RIGHT CEPHALIC VEIN ANEURYSM CONTENTS: - MURAL THROMBUS FRAGMENTS WITH THIN RIM OF ORGANIZATION.   GROSS DESCRIPTION: A. Labeled: infected pseudoaneurysm and stent right arm Tissue fragment(s): multiple Size: soft tissue aggregate, 3.5 x 2.2 x 1.1 cm, and one 12.6 x 0.9 x 0.9 cm stent Description: soft tissue is yellow-tan red and friable, stent has some int raluminal soft material but is grossly patent.  Representative tissue submitted in 1 cassette(s).   B. Labeled: right cephalic vein Tissue fragment(s): multiple Size: aggregate, 3.2 x 2.5 x 2.2 cm Description: shaggy laminated red brown  tissue  Representative submitted in 1 cassette(s).  Final Diagnosis performed by Bryan Lemma, MD.  Electronically signed 02/01/2016 6:05:12PM    The electronic signature indicates that the named Attending Pathologist has evaluated the specimen  Technical component performed at [facility], [address], [place], [place] [address] Lab: [phone] Dir: Darrick Penna. Evette Doffing, MD  Professional component performed at [facility], [facility], [place], [place], [place] [address] Lab: [phone] Dir: Dellia Nims. Rubinas, MD    Glucose, capillary     Status: None   Collection Time: 01/31/16 12:19 PM  Result Value Ref Range   Glucose-Capillary 68 65 - 99 mg/dL  CBC with Differential     Status: Abnormal   Collection Time: 02/17/16  5:14 PM  Result Value Ref Range   WBC 8.9 3.8 - 10.6 K/uL   RBC 3.68 (L) 4.40 - 5.90 MIL/uL   Hemoglobin 11.7 (L) 13.0 - 18.0 g/dL   HCT 35.2 (L) 40.0 - 52.0 %   MCV 95.4 80.0 - 100.0 fL   MCH 31.7 26.0 - 34.0 pg   MCHC 33.2 32.0 - 36.0 g/dL   RDW  13.7 11.5 - 14.5 %   Platelets 166 150 - 440 K/uL   Neutrophils Relative % 81 %   Neutro Abs 7.2 (H) 1.4 - 6.5 K/uL   Lymphocytes Relative 9 %   Lymphs Abs 0.8 (L) 1.0 - 3.6 K/uL   Monocytes Relative 7 %   Monocytes Absolute 0.7 0.2 - 1.0 K/uL   Eosinophils Relative 2 %   Eosinophils Absolute 0.2 0 - 0.7 K/uL   Basophils Relative 1 %   Basophils Absolute 0.1 0 - 0.1 K/uL  Comprehensive metabolic panel     Status: Abnormal   Collection Time: 02/17/16  5:14 PM  Result Value Ref Range   Sodium 132 (L) 135 - 145 mmol/L   Potassium 5.1 3.5 - 5.1 mmol/L   Chloride 98 (L) 101 - 111 mmol/L   CO2 24 22 - 32 mmol/L   Glucose, Bld 92 65 - 99 mg/dL   BUN 71 (H) 6 - 20 mg/dL   Creatinine, Ser 8.85 (H) 0.61 - 1.24 mg/dL   Calcium 8.8 (L) 8.9 - 10.3 mg/dL   Total Protein 7.9 6.5 - 8.1 g/dL   Albumin 3.0 (L) 3.5 - 5.0 g/dL   AST 45 (H) 15 - 41 U/L   ALT 37 17 - 63 U/L   Alkaline Phosphatase 95 38  - 126 U/L   Total Bilirubin 0.4 0.3 - 1.2 mg/dL   GFR calc non Af Amer 6 (L) >60 mL/min   GFR calc Af Amer 7 (L) >60 mL/min    Comment: (NOTE) The eGFR has been calculated using the CKD EPI equation. This calculation has not been validated in all clinical situations. eGFR's persistently <60 mL/min signify possible Chronic Kidney Disease.    Anion gap 10 5 - 15  Aerobic/Anaerobic Culture (surgical/deep wound)     Status: None   Collection Time: 02/17/16  7:15 PM  Result Value Ref Range   Specimen Description WOUND RIGHT ARM    Special Requests NONE    Gram Stain      FEW WBC PRESENT, PREDOMINANTLY PMN RARE GRAM NEGATIVE RODS    Culture      MODERATE PROTEUS MIRABILIS NO ANAEROBES ISOLATED Performed at [facility]    Report Status 02/23/2016 FINAL    Organism ID, Bacteria PROTEUS MIRABILIS       Susceptibility   Proteus mirabilis - MIC*    AMPICILLIN >=32 RESISTANT Resistant     CEFAZOLIN <=4 SENSITIVE Sensitive     CEFEPIME <=1 SENSITIVE Sensitive     CEFTAZIDIME <=1 SENSITIVE Sensitive     CEFTRIAXONE <=1 SENSITIVE Sensitive     CIPROFLOXACIN >=4 RESISTANT Resistant     GENTAMICIN <=1 SENSITIVE Sensitive     IMIPENEM 0.5 SENSITIVE Sensitive     TRIMETH/SULFA <=20 SENSITIVE Sensitive     AMPICILLIN/SULBACTAM <=2 SENSITIVE Sensitive     PIP/TAZO <=4 SENSITIVE Sensitive     * MODERATE PROTEUS MIRABILIS  Glucose, capillary     Status: Abnormal   Collection Time: 02/17/16  8:35 PM  Result Value Ref Range   Glucose-Capillary 109 (H) 65 - 99 mg/dL    Radiology No results found.  Assessment/Plan  HYPERTENSION, BENIGN blood pressure control important in reducing the progression of atherosclerotic disease. On appropriate oral medications.   Kidney dialysis as the cause of abnormal reaction of the patient, or of later complication, without mention of misadventure at the time of the procedure (CODE) His infected right arm graft has been removed. His wound has  almost completely healed. There is no sign of current infection. I think we can start to place his new access and has a clearly visible cephalic vein at the antecubital fossa on his left arm. A left brachiocephalic AV fistula will be planned.  End stage renal disease His infected right arm graft has been removed. His wound has almost completely healed. There is no sign of current infection. I think we can start to place his new access and has a clearly visible cephalic vein at the antecubital fossa on his left arm. A left brachiocephalic AV fistula will be planned.  PVD (peripheral vascular disease) ([place])  The patient's noninvasive studies today demonstrate a noncompressible ABI but he still has a good waveform on the right foot. 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 We will plan to recheck his perfusion in 6 months as no intervention is currently required.    Leotis Pain, MD  03/05/2016 11:02 AM    This note was created with Dragon medical transcription system.  Any errors from dictation are purely unintentional

## 2016-03-05 NOTE — Assessment & Plan Note (Signed)
His infected right arm graft has been removed. His wound has almost completely healed. There is no sign of current infection. I think we can start to place his new access and has a clearly visible cephalic vein at the antecubital fossa on his left arm. A left brachiocephalic AV fistula will be planned.

## 2016-03-12 ENCOUNTER — Encounter (INDEPENDENT_AMBULATORY_CARE_PROVIDER_SITE_OTHER): Payer: Self-pay

## 2016-03-12 ENCOUNTER — Ambulatory Visit (INDEPENDENT_AMBULATORY_CARE_PROVIDER_SITE_OTHER): Payer: Self-pay | Admitting: Vascular Surgery

## 2016-03-14 ENCOUNTER — Encounter (INDEPENDENT_AMBULATORY_CARE_PROVIDER_SITE_OTHER): Payer: Self-pay

## 2016-03-14 ENCOUNTER — Encounter
Admission: RE | Admit: 2016-03-14 | Discharge: 2016-03-14 | Disposition: A | Discharge status: Home / Self Care | Payer: Medicare Other | Source: Ambulatory Visit | Attending: Vascular Surgery | Admitting: Vascular Surgery

## 2016-03-14 DIAGNOSIS — Z01818 Encounter for other preprocedural examination: Secondary | ICD-10-CM | POA: Insufficient documentation

## 2016-03-14 HISTORY — DX: Personal history of urinary calculi: Z87.442

## 2016-03-14 NOTE — Patient Instructions (Signed)
  Your procedure is scheduled MB:[id number] Oct. 26, 2017. Report to Same Day Surgery. To find out your arrival time please call [phone] between 1PM - 3PM on Wednesday Mar 20, 2016.  Remember: Instructions that are not followed completely may result in serious medical risk, up to and including death, or upon the discretion of your surgeon and anesthesiologist your surgery may need to be rescheduled.    _x___ 1. Do not eat food or drink liquids after midnight. No gum chewing or hard candies.     ____ 2. No Alcohol for 24 hours before or after surgery.   ____ 3. Bring all medications with you on the day of surgery if instructed.    __x__ 4. Notify your doctor if there is any change in your medical condition     (cold, fever, infections).    __x___ 5. No smoking 24 hours prior to surgery.     Do not wear jewelry, make-up, hairpins, clips or nail polish.  Do not wear lotions, powders, or perfumes.   Do not shave 48 hours prior to surgery. Men may shave face and neck.  Do not bring valuables to the hospital.    [facility] is not responsible for any belongings or valuables.               Contacts, dentures or bridgework may not be worn into surgery.  Leave your suitcase in the car. After surgery it may be brought to your room.  For patients admitted to the hospital, discharge time is determined by your treatment team.   Patients discharged the day of surgery will not be allowed to drive home.    Please read over the following fact sheets that you were given:   [facility] Preparing for Surgery  ____ Take these medicines the morning of surgery with A SIP OF WATER:    1. metoprolol tartrate (LOPRESSOR)*  2. oxyCODONE-acetaminophen (PERCOCET) if needed  3. pantoprazole (PROTONIX)  4. pregabalin (LYRICA)    ____ Fleet Enema (as directed)   _x___ Use SAGE wipes as directed on instruction sheet  _x___ Use inhalers on the day of surgery and bring to hospital day of  surgery  ____ Stop metformin 2 days prior to surgery    ____ Take 1/2 of usual insulin dose the night before surgery and none on the morning of surgery.   ____ Stop Coumadin/Plavix/aspirin on does not apply.  ____ Stop Anti-inflammatories such as Advil, Aleve, Ibuprofen, Motrin, Naproxen,  Naprosyn, Goodies powders or aspirin products. OK to take Tylenol or oxycodone.   ____ Stop supplements until after surgery.    ____ Bring C-Pap to the hospital.

## 2016-03-14 NOTE — Pre-Procedure Instructions (Signed)
Spoke with Mickel Baas at Dr.[name]'s office regarding pt on antibiotic for pneumonia, anesthesia will require a medical clearance prior to surgery.  Pt will be tentatively rescheduled for surgery on Nov. 8, 2017 pending medical clearance.

## 2016-03-15 ENCOUNTER — Encounter (INDEPENDENT_AMBULATORY_CARE_PROVIDER_SITE_OTHER): Payer: Self-pay

## 2016-03-28 ENCOUNTER — Encounter (INDEPENDENT_AMBULATORY_CARE_PROVIDER_SITE_OTHER): Payer: Self-pay

## 2016-03-28 ENCOUNTER — Telehealth (INDEPENDENT_AMBULATORY_CARE_PROVIDER_SITE_OTHER): Payer: Self-pay

## 2016-03-28 NOTE — Telephone Encounter (Signed)
Called and stated that the patient still has infiltration in his lungs and will probably be starting another round of antibiotics to clear up his pnuemonia. They are faxing over the lab results today.

## 2016-04-02 ENCOUNTER — Inpatient Hospital Stay
Admission: RE | Admit: 2016-04-02 | Discharge: 2016-04-02 | Disposition: A | Discharge status: Home / Self Care | Payer: Medicare Other | Source: Ambulatory Visit

## 2016-04-02 NOTE — Pre-Procedure Instructions (Signed)
Spoke with Mickel Baas @ Dr Bunnie Domino office regarding medical clearance-Laura states pt still has not been cleared and Mickel Baas is working on clearance-Pt is to be having CXR done today to see if pneumonia has cleared.  Mickel Baas will let us know in PAT if pt is cleared for surgery tomorrow

## 2016-04-03 ENCOUNTER — Encounter: Admission: RE | Payer: Self-pay | Source: Ambulatory Visit

## 2016-04-03 ENCOUNTER — Ambulatory Visit: Admission: RE | Admit: 2016-04-03 | Payer: Medicare Other | Source: Ambulatory Visit | Admitting: Vascular Surgery

## 2016-04-03 SURGERY — ARTERIOVENOUS (AV) FISTULA CREATION
Anesthesia: General | Laterality: Left

## 2016-04-04 ENCOUNTER — Encounter
Admission: RE | Admit: 2016-04-04 | Discharge: 2016-04-04 | Disposition: A | Discharge status: Home / Self Care | Payer: Medicare Other | Source: Ambulatory Visit | Attending: Vascular Surgery | Admitting: Vascular Surgery

## 2016-04-04 DIAGNOSIS — Z01812 Encounter for preprocedural laboratory examination: Secondary | ICD-10-CM | POA: Insufficient documentation

## 2016-04-04 DIAGNOSIS — N186 End stage renal disease: Secondary | ICD-10-CM | POA: Diagnosis not present

## 2016-04-04 LAB — CBC
HCT: 35.4 % — ABNORMAL LOW (ref 40.0–52.0)
HEMOGLOBIN: 11.7 g/dL — AB (ref 13.0–18.0)
MCH: 31.3 pg (ref 26.0–34.0)
MCHC: 33.2 g/dL (ref 32.0–36.0)
MCV: 94.3 fL (ref 80.0–100.0)
Platelets: 162 10*3/uL (ref 150–440)
RBC: 3.75 MIL/uL — AB (ref 4.40–5.90)
RDW: 15.4 % — ABNORMAL HIGH (ref 11.5–14.5)
WBC: 7.1 10*3/uL (ref 3.8–10.6)

## 2016-04-04 LAB — BASIC METABOLIC PANEL
ANION GAP: 15 (ref 5–15)
BUN: 85 mg/dL — ABNORMAL HIGH (ref 6–20)
CALCIUM: 9.5 mg/dL (ref 8.9–10.3)
CO2: 24 mmol/L (ref 22–32)
Chloride: 96 mmol/L — ABNORMAL LOW (ref 101–111)
Creatinine, Ser: 8.62 mg/dL — ABNORMAL HIGH (ref 0.61–1.24)
GFR, EST AFRICAN AMERICAN: 7 mL/min — AB (ref >60)
GFR, EST NON AFRICAN AMERICAN: 6 mL/min — AB (ref >60)
GLUCOSE: 108 mg/dL — AB (ref 65–99)
Potassium: 5.8 mmol/L — ABNORMAL HIGH (ref 3.5–5.1)
Sodium: 135 mmol/L (ref 135–145)

## 2016-04-04 LAB — DIFFERENTIAL
BASOS ABS: 0.1 10*3/uL (ref 0–0.1)
Basophils Relative: 1 %
EOS ABS: 0.3 10*3/uL (ref 0–0.7)
Eosinophils Relative: 4 %
LYMPHS ABS: 1.5 10*3/uL (ref 1.0–3.6)
Lymphocytes Relative: 21 %
Monocytes Absolute: 1 10*3/uL (ref 0.2–1.0)
Monocytes Relative: 14 %
NEUTROS PCT: 60 %
Neutro Abs: 4.3 10*3/uL (ref 1.4–6.5)

## 2016-04-04 LAB — PROTIME-INR
INR: 1.16
PROTHROMBIN TIME: 14.9 s (ref 11.4–15.2)

## 2016-04-04 LAB — APTT: APTT: 34 s (ref 24–36)

## 2016-04-04 LAB — SURGICAL PCR SCREEN
MRSA, PCR: NEGATIVE
Staphylococcus aureus: NEGATIVE

## 2016-04-04 NOTE — Pre-Procedure Instructions (Signed)
Received an order for CXR from Dr. Lenor Coffin, notified [facility], spoke to [facility] and patient is to return to [facility] on April 05, 2016 to have CXR .

## 2016-04-08 ENCOUNTER — Ambulatory Visit: Payer: Medicare Other

## 2016-04-09 ENCOUNTER — Ambulatory Visit
Admission: RE | Admit: 2016-04-09 | Discharge: 2016-04-09 | Disposition: A | Discharge status: Home / Self Care | Payer: Medicare Other | Source: Ambulatory Visit | Attending: Internal Medicine | Admitting: Internal Medicine

## 2016-04-09 ENCOUNTER — Ambulatory Visit
Admission: RE | Admit: 2016-04-09 | Discharge: 2016-04-09 | Disposition: A | Discharge status: Home / Self Care | Payer: Medicare Other | Source: Ambulatory Visit | Attending: Specialist | Admitting: Specialist

## 2016-04-09 ENCOUNTER — Encounter (INDEPENDENT_AMBULATORY_CARE_PROVIDER_SITE_OTHER): Payer: Self-pay

## 2016-04-09 ENCOUNTER — Ambulatory Visit (INDEPENDENT_AMBULATORY_CARE_PROVIDER_SITE_OTHER): Payer: Self-pay | Admitting: Vascular Surgery

## 2016-04-09 ENCOUNTER — Other Ambulatory Visit: Payer: Self-pay | Admitting: Internal Medicine

## 2016-04-09 DIAGNOSIS — J9811 Atelectasis: Secondary | ICD-10-CM | POA: Insufficient documentation

## 2016-04-09 DIAGNOSIS — J90 Pleural effusion, not elsewhere classified: Secondary | ICD-10-CM | POA: Insufficient documentation

## 2016-04-09 DIAGNOSIS — I700 Atherosclerosis of aorta: Secondary | ICD-10-CM | POA: Insufficient documentation

## 2016-04-09 DIAGNOSIS — J189 Pneumonia, unspecified organism: Secondary | ICD-10-CM

## 2016-05-01 ENCOUNTER — Other Ambulatory Visit
Admission: RE | Admit: 2016-05-01 | Discharge: 2016-05-01 | Disposition: A | Discharge status: Home / Self Care | Payer: Medicare Other | Source: Ambulatory Visit | Attending: Family Medicine | Admitting: Family Medicine

## 2016-05-01 DIAGNOSIS — E875 Hyperkalemia: Secondary | ICD-10-CM | POA: Diagnosis present

## 2016-05-01 LAB — POTASSIUM: Potassium: 6.8 mmol/L (ref 3.5–5.1)

## 2016-05-21 ENCOUNTER — Emergency Department: Payer: Medicare Other

## 2016-05-21 ENCOUNTER — Inpatient Hospital Stay
Admission: EM | Admit: 2016-05-21 | Discharge: 2016-05-24 | DRG: 189 | Disposition: A | Discharge status: Skilled Nursing Facility | Payer: Medicare Other | Attending: Internal Medicine | Admitting: Internal Medicine

## 2016-05-21 DIAGNOSIS — E875 Hyperkalemia: Secondary | ICD-10-CM | POA: Diagnosis present

## 2016-05-21 DIAGNOSIS — N186 End stage renal disease: Secondary | ICD-10-CM | POA: Diagnosis present

## 2016-05-21 DIAGNOSIS — Z951 Presence of aortocoronary bypass graft: Secondary | ICD-10-CM

## 2016-05-21 DIAGNOSIS — E1122 Type 2 diabetes mellitus with diabetic chronic kidney disease: Secondary | ICD-10-CM | POA: Diagnosis present

## 2016-05-21 DIAGNOSIS — F17210 Nicotine dependence, cigarettes, uncomplicated: Secondary | ICD-10-CM | POA: Diagnosis present

## 2016-05-21 DIAGNOSIS — Z89512 Acquired absence of left leg below knee: Secondary | ICD-10-CM

## 2016-05-21 DIAGNOSIS — Z9115 Patient's noncompliance with renal dialysis: Secondary | ICD-10-CM

## 2016-05-21 DIAGNOSIS — I248 Other forms of acute ischemic heart disease: Secondary | ICD-10-CM | POA: Diagnosis present

## 2016-05-21 DIAGNOSIS — E213 Hyperparathyroidism, unspecified: Secondary | ICD-10-CM | POA: Diagnosis present

## 2016-05-21 DIAGNOSIS — I2510 Atherosclerotic heart disease of native coronary artery without angina pectoris: Secondary | ICD-10-CM | POA: Diagnosis present

## 2016-05-21 DIAGNOSIS — J9601 Acute respiratory failure with hypoxia: Secondary | ICD-10-CM

## 2016-05-21 DIAGNOSIS — I252 Old myocardial infarction: Secondary | ICD-10-CM | POA: Diagnosis not present

## 2016-05-21 DIAGNOSIS — Q231 Congenital insufficiency of aortic valve: Secondary | ICD-10-CM

## 2016-05-21 DIAGNOSIS — D631 Anemia in chronic kidney disease: Secondary | ICD-10-CM | POA: Diagnosis present

## 2016-05-21 DIAGNOSIS — E1151 Type 2 diabetes mellitus with diabetic peripheral angiopathy without gangrene: Secondary | ICD-10-CM | POA: Diagnosis present

## 2016-05-21 DIAGNOSIS — J810 Acute pulmonary edema: Principal | ICD-10-CM | POA: Diagnosis present

## 2016-05-21 DIAGNOSIS — R778 Other specified abnormalities of plasma proteins: Secondary | ICD-10-CM

## 2016-05-21 DIAGNOSIS — E785 Hyperlipidemia, unspecified: Secondary | ICD-10-CM | POA: Diagnosis present

## 2016-05-21 DIAGNOSIS — Z89422 Acquired absence of other left toe(s): Secondary | ICD-10-CM

## 2016-05-21 DIAGNOSIS — K219 Gastro-esophageal reflux disease without esophagitis: Secondary | ICD-10-CM | POA: Diagnosis present

## 2016-05-21 DIAGNOSIS — I5022 Chronic systolic (congestive) heart failure: Secondary | ICD-10-CM | POA: Diagnosis present

## 2016-05-21 DIAGNOSIS — J181 Lobar pneumonia, unspecified organism: Secondary | ICD-10-CM

## 2016-05-21 DIAGNOSIS — Z992 Dependence on renal dialysis: Secondary | ICD-10-CM | POA: Diagnosis not present

## 2016-05-21 DIAGNOSIS — I132 Hypertensive heart and chronic kidney disease with heart failure and with stage 5 chronic kidney disease, or end stage renal disease: Secondary | ICD-10-CM | POA: Diagnosis present

## 2016-05-21 DIAGNOSIS — I429 Cardiomyopathy, unspecified: Secondary | ICD-10-CM | POA: Diagnosis present

## 2016-05-21 DIAGNOSIS — J189 Pneumonia, unspecified organism: Secondary | ICD-10-CM | POA: Diagnosis present

## 2016-05-21 DIAGNOSIS — Z87442 Personal history of urinary calculi: Secondary | ICD-10-CM

## 2016-05-21 DIAGNOSIS — Z91048 Other nonmedicinal substance allergy status: Secondary | ICD-10-CM

## 2016-05-21 DIAGNOSIS — Z7982 Long term (current) use of aspirin: Secondary | ICD-10-CM

## 2016-05-21 DIAGNOSIS — Z7951 Long term (current) use of inhaled steroids: Secondary | ICD-10-CM

## 2016-05-21 DIAGNOSIS — J811 Chronic pulmonary edema: Secondary | ICD-10-CM | POA: Diagnosis present

## 2016-05-21 DIAGNOSIS — N2581 Secondary hyperparathyroidism of renal origin: Secondary | ICD-10-CM | POA: Diagnosis present

## 2016-05-21 DIAGNOSIS — I509 Heart failure, unspecified: Secondary | ICD-10-CM

## 2016-05-21 DIAGNOSIS — Z79899 Other long term (current) drug therapy: Secondary | ICD-10-CM

## 2016-05-21 DIAGNOSIS — J440 Chronic obstructive pulmonary disease with acute lower respiratory infection: Secondary | ICD-10-CM | POA: Diagnosis present

## 2016-05-21 DIAGNOSIS — Z955 Presence of coronary angioplasty implant and graft: Secondary | ICD-10-CM

## 2016-05-21 DIAGNOSIS — Z8249 Family history of ischemic heart disease and other diseases of the circulatory system: Secondary | ICD-10-CM

## 2016-05-21 DIAGNOSIS — R0602 Shortness of breath: Secondary | ICD-10-CM

## 2016-05-21 DIAGNOSIS — I272 Pulmonary hypertension, unspecified: Secondary | ICD-10-CM | POA: Diagnosis present

## 2016-05-21 DIAGNOSIS — R7989 Other specified abnormal findings of blood chemistry: Secondary | ICD-10-CM

## 2016-05-21 LAB — COMPREHENSIVE METABOLIC PANEL
ALBUMIN: 3.5 g/dL (ref 3.5–5.0)
ALT: 46 U/L (ref 17–63)
ANION GAP: 18 — AB (ref 5–15)
AST: 46 U/L — AB (ref 15–41)
Alkaline Phosphatase: 84 U/L (ref 38–126)
BILIRUBIN TOTAL: 0.7 mg/dL (ref 0.3–1.2)
BUN: 160 mg/dL — ABNORMAL HIGH (ref 6–20)
CHLORIDE: 99 mmol/L — AB (ref 101–111)
CO2: 17 mmol/L — AB (ref 22–32)
Calcium: 9.6 mg/dL (ref 8.9–10.3)
Creatinine, Ser: 14.14 mg/dL — ABNORMAL HIGH (ref 0.61–1.24)
GFR calc Af Amer: 4 mL/min — ABNORMAL LOW (ref >60)
GFR calc non Af Amer: 3 mL/min — ABNORMAL LOW (ref >60)
GLUCOSE: 105 mg/dL — AB (ref 65–99)
Sodium: 134 mmol/L — ABNORMAL LOW (ref 135–145)
TOTAL PROTEIN: 9.1 g/dL — AB (ref 6.5–8.1)

## 2016-05-21 LAB — BRAIN NATRIURETIC PEPTIDE: B Natriuretic Peptide: 3452 pg/mL — ABNORMAL HIGH (ref 0.0–100.0)

## 2016-05-21 LAB — CBC WITH DIFFERENTIAL/PLATELET
BASOS ABS: 0.1 10*3/uL (ref 0–0.1)
Basophils Relative: 1 %
EOS PCT: 3 %
Eosinophils Absolute: 0.2 10*3/uL (ref 0–0.7)
HCT: 38.5 % — ABNORMAL LOW (ref 40.0–52.0)
HEMOGLOBIN: 12.8 g/dL — AB (ref 13.0–18.0)
LYMPHS PCT: 20 %
Lymphs Abs: 1.7 10*3/uL (ref 1.0–3.6)
MCH: 30.4 pg (ref 26.0–34.0)
MCHC: 33.4 g/dL (ref 32.0–36.0)
MCV: 91.3 fL (ref 80.0–100.0)
Monocytes Absolute: 0.9 10*3/uL (ref 0.2–1.0)
Monocytes Relative: 11 %
NEUTROS PCT: 67 %
Neutro Abs: 5.7 10*3/uL (ref 1.4–6.5)
PLATELETS: 170 10*3/uL (ref 150–440)
RBC: 4.21 MIL/uL — AB (ref 4.40–5.90)
RDW: 15.9 % — ABNORMAL HIGH (ref 11.5–14.5)
WBC: 8.6 10*3/uL (ref 3.8–10.6)

## 2016-05-21 LAB — TROPONIN I: Troponin I: 0.19 ng/mL (ref <0.03)

## 2016-05-21 LAB — LACTIC ACID, PLASMA: LACTIC ACID, VENOUS: 1.2 mmol/L (ref 0.5–1.9)

## 2016-05-21 MED ORDER — FUROSEMIDE 10 MG/ML IJ SOLN
40.0000 mg | Freq: Once | INTRAMUSCULAR | Status: DC
  Filled 2016-05-21: qty 4

## 2016-05-21 MED ORDER — DEXTROSE 50 % IV SOLN
25.0000 g | Freq: Once | INTRAVENOUS | Status: AC
  Administered 2016-05-21: 25 g via INTRAVENOUS
  Filled 2016-05-21: qty 50

## 2016-05-21 MED ORDER — ALBUTEROL SULFATE (2.5 MG/3ML) 0.083% IN NEBU
5.0000 mg | INHALATION_SOLUTION | Freq: Once | RESPIRATORY_TRACT | Status: AC
  Administered 2016-05-21: 5 mg via RESPIRATORY_TRACT
  Filled 2016-05-21: qty 6

## 2016-05-21 MED ORDER — SODIUM BICARBONATE 8.4 % IV SOLN
50.0000 meq | Freq: Once | INTRAVENOUS | Status: AC
  Administered 2016-05-21: 50 meq via INTRAVENOUS
  Filled 2016-05-21: qty 50

## 2016-05-21 MED ORDER — INSULIN ASPART 100 UNIT/ML ~~LOC~~ SOLN
5.0000 [IU] | Freq: Once | SUBCUTANEOUS | Status: AC
  Administered 2016-05-21: 5 [IU] via INTRAVENOUS
  Filled 2016-05-21: qty 5

## 2016-05-21 NOTE — ED Notes (Signed)
Pt provided some ice chips

## 2016-05-21 NOTE — ED Notes (Signed)
Dr. Almyra Free at bedside.

## 2016-05-21 NOTE — ED Notes (Signed)
Pt in x-ray via stretcher, family at bedside.

## 2016-05-21 NOTE — ED Triage Notes (Signed)
Pt presents to ED via POV through ambulance entrance. Pt was placed in wheelchair and taken to room 2. Pt has L BKA. Pt able to stand on R leg but states he is took weak. Pt states SOB x 2 days, denies CP. States he has not been able to cough anything up but has a cough. Pt is breathing fast, states he can't slow his breathing down. Pt is alert and oriented.

## 2016-05-21 NOTE — ED Provider Notes (Addendum)
[facility] Emergency Department Provider Note  ____________________________________________   First MD Initiated Contact with Patient 05/21/16 2009     (approximate)  I have reviewed the triage vital signs and the nursing notes.   HISTORY  Chief Complaint Shortness of Breath    HPI [name] is a 60 y.o. male who complains of increased shortness breath has been going on for about 2 days. Patient missed dialysis Sunday. Patient has not had any chest tightness or anything else but is short of breath shortness breath is coming and going. He has no fever. He is not coughing anything up. He does have COPD.   Past Medical History:  Diagnosis Date  . Anemia   . Asthma   . CHF (congestive heart failure) ([place])   . Chronic systolic heart failure ([place])   . Complication of anesthesia    hypotension  . COPD (chronic obstructive pulmonary disease) ([place])   . Coronary artery disease   . Dialysis patient ([place])    Mon, Wed, Fri  . End stage renal disease ([place])   . GERD (gastroesophageal reflux disease)   . Headache   . History of kidney stones   . HLD (hyperlipidemia)   . HTN (hypertension)   . Hyperparathyroidism   . Myocardial infarction   . Peripheral vascular disease ([place])   . Shortness of breath dyspnea   . Sleep apnea   . Tobacco dependence     Patient Active Problem List   Diagnosis Date Noted  . Kidney dialysis as the cause of abnormal reaction of the patient, or of later complication, without mention of misadventure at the time of the procedure (CODE) 03/05/2016  . PVD (peripheral vascular disease) ([place]) 03/05/2016  . Preoperative cardiovascular examination 05/02/2015  . Chronic systolic heart failure ([place])   . Sepsis due to cellulitis ([place]) 01/13/2015  . Acute encephalopathy 01/13/2015  . Hyperkalemia 01/13/2015  . Gangrene of foot ([place]) 05/04/2014  . ESRD (end stage renal disease) on dialysis ([place]) 05/04/2014  . CAD (coronary artery  disease) 05/04/2014  . Normocytic anemia 05/04/2014  . HYPERLIPIDEMIA-MIXED 04/26/2010  . HYPERTENSION, BENIGN 04/26/2010  . CAD, NATIVE VESSEL 04/26/2010  . End stage renal disease ([place]) 04/26/2010    Past Surgical History:  Procedure Laterality Date  . AMPUTATION Left 05/06/2014   Procedure: AMPUTATION BELOW KNEE;  Surgeon: Elam Dutch, MD;  Location: [name];  Service: Vascular;  Laterality: Left;  . AMPUTATION Right 01/12/2015   Procedure: Foot transmetatarsal amputation;  Surgeon: Algernon Huxley, MD;  Location: [facility];  Service: Vascular;  Laterality: Right;  . APPLICATION OF WOUND VAC Right 03/01/2015   Procedure: Application of Bio-connekt graft and wound vac application to right foot ;  Surgeon: Algernon Huxley, MD;  Location: [facility];  Service: Vascular;  Laterality: Right;  . AV FISTULA PLACEMENT Left   . CARDIAC CATHETERIZATION     stent placement   . CORONARY ANGIOPLASTY    . LIGATION OF ARTERIOVENOUS  FISTULA Right 01/31/2016   Procedure: LIGATION OF ARTERIOVENOUS  FISTULA;  Surgeon: Algernon Huxley, MD;  Location: [facility];  Service: Vascular;  Laterality: Right;  . PERIPHERAL VASCULAR CATHETERIZATION Right 12/15/2014   Procedure: Lower Extremity Angiography;  Surgeon: Algernon Huxley, MD;  Location: [place] [facility];  Service: Cardiovascular;  Laterality: Right;  . PERIPHERAL VASCULAR CATHETERIZATION  12/15/2014   Procedure: Lower Extremity Intervention;  Surgeon: Algernon Huxley, MD;  Location: [place] [facility];  Service:  Cardiovascular;;  . PERIPHERAL VASCULAR CATHETERIZATION Right 08/14/2015   Procedure: A/V Shuntogram/Fistulagram;  Surgeon: Algernon Huxley, MD;  Location: [place] [facility];  Service: Cardiovascular;  Laterality: Right;  . PERIPHERAL VASCULAR CATHETERIZATION N/A 08/14/2015   Procedure: A/V Shunt Intervention;  Surgeon: Algernon Huxley, MD;  Location: [place] [facility];  Service: Cardiovascular;  Laterality: N/A;  . PERIPHERAL VASCULAR CATHETERIZATION N/A 01/11/2016    Procedure: Dialysis/Perma Catheter Insertion;  Surgeon: Algernon Huxley, MD;  Location: [place] [facility];  Service: Cardiovascular;  Laterality: N/A;  . REVISON OF ARTERIOVENOUS FISTULA Right 02/17/2016   Procedure: removal of AV fistula;  Surgeon: Serafina Mitchell, MD;  Location: [facility];  Service: Vascular;  Laterality: Right;  . REVISON OF ARTERIOVENOUS FISTULA Right 01/31/2016   Procedure: REVISON OF ARTERIOVENOUS FISTULA ( BRACHIOCEPHALIC ) W/ ARTEGRAFT;  Surgeon: Algernon Huxley, MD;  Location: [facility];  Service: Vascular;  Laterality: Right;  . TRANSMETATARSAL AMPUTATION Right 05/04/2015   Procedure: TRANSMETATARSAL AMPUTATION REVISION, great toe amputation;  Surgeon: Algernon Huxley, MD;  Location: [facility];  Service: Vascular;  Laterality: Right;    Prior to Admission medications   Medication Sig Start Date End Date Taking? Authorizing Provider  acetaminophen (TYLENOL) 500 MG tablet Take 1,000 mg by mouth every 6 (six) hours as needed for moderate pain.    Historical Provider, MD  albuterol (PROVENTIL HFA;VENTOLIN HFA) 108 (90 BASE) MCG/ACT inhaler Inhale 2 puffs into the lungs every 6 (six) hours as needed for wheezing or shortness of breath.     Historical Provider, MD  aspirin 325 MG tablet Take 325 mg by mouth every morning.     Historical Provider, MD  atorvastatin (LIPITOR) 10 MG tablet Take 10 mg by mouth every morning.     Historical Provider, MD  budesonide-formoterol (SYMBICORT) 160-4.5 MCG/ACT inhaler Inhale 2 puffs into the lungs 2 (two) times daily. Reported on 08/14/2015    Historical Provider, MD  calcium acetate (PHOSLO) 667 MG capsule Take 667 mg by mouth 3 (three) times daily with meals.    Historical Provider, MD  Darbepoetin Alfa (ARANESP) 40 MCG/0.4ML SOSY injection Inject 0.4 mLs (40 mcg total) into the vein every Monday with hemodialysis. Patient not taking: Reported on 03/14/2016 05/16/14   Jonetta Osgood, MD  docusate sodium (COLACE) 100 MG capsule Take 100 mg by mouth 2  (two) times daily. Reported on 08/14/2015    Historical Provider, MD  doxercalciferol (HECTOROL) 4 MCG/2ML injection Inject 1.25 mLs (2.5 mcg total) into the vein every Monday, Wednesday, and Friday with hemodialysis. 05/10/14   Shanker Kristeen Mans, MD  ferric gluconate 125 mg in sodium chloride 0.9 % 100 mL Inject 125 mg into the vein every Monday, Wednesday, and Friday with hemodialysis. 05/10/14   Shanker Kristeen Mans, MD  ipratropium-albuterol (DUONEB) 0.5-2.5 (3) MG/3ML SOLN Take 3 mLs by nebulization every 4 (four) hours as needed (SOB).    Historical Provider, MD  metoprolol tartrate (LOPRESSOR) 25 MG tablet Take 25 mg by mouth 2 (two) times daily.      Historical Provider, MD  midodrine (PROAMATINE) 5 MG tablet Take 5 mg by mouth. 3 times a week, 30 minutes before dialysis    Historical Provider, MD  moxifloxacin (AVELOX) 400 MG tablet Take 400 mg by mouth every morning. For 10 days, staring 03/12/2016.    Historical Provider, MD  multivitamin (RENA-VIT) TABS tablet Take 1 tablet by mouth at bedtime. 05/10/14   Shanker Kristeen Mans, MD  Nutritional Supplements (  FEEDING SUPPLEMENT, NEPRO CARB STEADY,) LIQD Take 237 mLs by mouth daily.    Historical Provider, MD  OxyCODONE HCl, Abuse Deter, (OXAYDO) 5 MG TABA Take 1 tablet by mouth every 8 (eight) hours as needed.    Historical Provider, MD  oxyCODONE-acetaminophen (PERCOCET) 7.5-325 MG tablet Take 1 tablet by mouth every 4 (four) hours as needed for severe pain. 01/31/16   Algernon Huxley, MD  pantoprazole (PROTONIX) 20 MG tablet Take 20 mg by mouth daily.    Historical Provider, MD  polyethylene glycol (MIRALAX / GLYCOLAX) packet Take 17 g by mouth every morning. Hold for loose stools.    Historical Provider, MD  pregabalin (LYRICA) 75 MG capsule Take 75 mg by mouth 2 (two) times daily.    Historical Provider, MD  sevelamer carbonate (RENVELA) 800 MG tablet Take 800 mg by mouth daily.     Historical Provider, MD  simvastatin (ZOCOR) 10 MG tablet Take 2  tablets by mouth every evening. Change in therapy 12/23/14   Historical Provider, MD  tiotropium (SPIRIVA) 18 MCG inhalation capsule Place 18 mcg into inhaler and inhale daily.    Historical Provider, MD  triamcinolone cream (KENALOG) 0.1 % Apply 1 application topically daily as needed. 11/07/14   Historical Provider, MD    Allergies Dust mite extract  Family History  Problem Relation Age of Onset  . Heart failure Other   . Hypertension Other   . Leukemia Other   . Diabetes Other     Social History Social History  Substance Use Topics  . Smoking status: Light Tobacco Smoker    Packs/day: 0.25    Types: Cigarettes  . Smokeless tobacco: Never Used     Comment: 5  . Alcohol use No    Review of Systems Constitutional: No fever/chills Eyes: No visual changes. ENT: No sore throat. Cardiovascular: Denies chest pain. Respiratory:  shortness of breath. Gastrointestinal: No abdominal pain.  No nausea, no vomiting.  No diarrhea.  No constipation. Genitourinary: Negative for dysuria. Musculoskeletal: Negative for back pain. Skin: Negative for rash. Neurological: Negative for headaches, focal weakness or numbness. gical: **} 10-point ROS otherwise negative.  ____________________________________________   PHYSICAL EXAM:  VITAL SIGNS: ED Triage Vitals  Enc Vitals Group     BP 05/21/16 1951 (!) 142/106     Pulse Rate 05/21/16 1951 61     Resp 05/21/16 1951 17     Temp 05/21/16 1951 97.7 F (36.5 C)     Temp Source 05/21/16 1951 Oral     SpO2 05/21/16 1951 97 %     Weight 05/21/16 1951 157 lb (71.2 kg)     Height 05/21/16 1951 6\' 2"  (1.88 m)     Head Circumference --      Peak Flow --      Pain Score 05/21/16 1952 7     Pain Loc --      Pain Edu? --      Excl. in [place]? --     Constitutional: Alert and oriented.Breathing hard Eyes: Conjunctivae are normal. PERRL. EOMI. Head: Atraumatic. Nose: No congestion/rhinnorhea. Mouth/Throat: Mucous membranes are moist.   Oropharynx non-erythematous. Neck: No stridor.  Cardiovascular: Normal rate, regular rhythm. Grossly normal heart sounds.  Good peripheral circulation. Respiratory: Normal respiratory effort.  No retractions. Lungs CTAB. Gastrointestinal: Soft and nontender. No distention. No abdominal bruits. No CVA tenderness. {Musculoskeletal: Left leg amputation right distal fifth toes amputation Neurologic:  Normal speech and language. No gross focal neurologic deficits are appreciated Skin:  Skin is warm, dry and intact. No rash noted. Psychiatric: Mood and affect are normal. Speech and behavior are normal.  ____________________________________________   LABS (all labs ordered are listed, but only abnormal results are displayed)  Labs Reviewed  COMPREHENSIVE METABOLIC PANEL - Abnormal; Notable for the following:       Result Value   Sodium 134 (*)    Potassium >7.5 (*)    Chloride 99 (*)    CO2 17 (*)    Glucose, Bld 105 (*)    BUN 160 (*)    Creatinine, Ser 14.14 (*)    Total Protein 9.1 (*)    AST 46 (*)    GFR calc non Af Amer 3 (*)    GFR calc Af Amer 4 (*)    Anion gap 18 (*)    All other components within normal limits  BRAIN NATRIURETIC PEPTIDE - Abnormal; Notable for the following:    B Natriuretic Peptide 3,452.0 (*)    All other components within normal limits  TROPONIN I - Abnormal; Notable for the following:    Troponin I 0.19 (*)    All other components within normal limits  CBC WITH DIFFERENTIAL/PLATELET - Abnormal; Notable for the following:    RBC 4.21 (*)    Hemoglobin 12.8 (*)    HCT 38.5 (*)    RDW 15.9 (*)    All other components within normal limits  LACTIC ACID, PLASMA  LACTIC ACID, PLASMA  URINALYSIS, COMPLETE (UACMP) WITH MICROSCOPIC   ____________________________________________  EKG  EKG read and interpreted by me shows normal sinus rhythm rate of 63 left axis flipped T's in 1 and L EKG looks very similar to one from 04/09/2016. There is also left  bundle-branch block.   RADIOLOGY  Radiology reads chest x-ray as pulmonary edema. It looks quite bad.  ____________________________________________   PROCEDURES  Procedure(s) performed:   Procedures  Critical Care performed:   ____________________________________________   INITIAL IMPRESSION / ASSESSMENT AND PLAN / ED COURSE  Pertinent labs & imaging results that were available during my care of the patient were reviewed by me and considered in my medical decision making (see chart for details).    Clinical Course    Patient does not make urine will not use Lasix will give him bicarbonate insulin and glucose for his potassium of 7.5. EKG is not particularly bad looking compared to the previous one. We'll put him on BiPAP consult the hospitalist for admission as discussed his case with Dr. Landry Mellow Route of the renal doctor.  ____________________________________________   FINAL CLINICAL IMPRESSION(S) / ED DIAGNOSES  Final diagnoses:  SOB (shortness of breath)  Acute on chronic congestive heart failure, unspecified congestive heart failure type (HCC)  Elevated troponin  Acute pulmonary edema (HCC)  Hyperkalemia      NEW MEDICATIONS STARTED DURING THIS VISIT:  New Prescriptions   No medications on file     Note:  This document was prepared using Dragon voice recognition software and may include unintentional dictation errors.    Nena Polio, MD 05/21/16 2121    Nena Polio, MD 05/21/16 2136

## 2016-05-21 NOTE — ED Notes (Addendum)
Pt states he goes to dialysis MWF, went Friday. Stated that "they didn't come pick me up on Sunday for dialysis" States he does not make urine. "Sometimes I do but most of the time I don't"

## 2016-05-22 LAB — CBC
HCT: 34.7 % — ABNORMAL LOW (ref 40.0–52.0)
Hemoglobin: 11.6 g/dL — ABNORMAL LOW (ref 13.0–18.0)
MCH: 30.3 pg (ref 26.0–34.0)
MCHC: 33.4 g/dL (ref 32.0–36.0)
MCV: 90.7 fL (ref 80.0–100.0)
PLATELETS: 149 10*3/uL — AB (ref 150–440)
RBC: 3.82 MIL/uL — ABNORMAL LOW (ref 4.40–5.90)
RDW: 15.7 % — AB (ref 11.5–14.5)
WBC: 8.6 10*3/uL (ref 3.8–10.6)

## 2016-05-22 LAB — BASIC METABOLIC PANEL
Anion gap: 21 — ABNORMAL HIGH (ref 5–15)
BUN: 161 mg/dL — AB (ref 6–20)
CHLORIDE: 104 mmol/L (ref 101–111)
CO2: 14 mmol/L — AB (ref 22–32)
CREATININE: 15.84 mg/dL — AB (ref 0.61–1.24)
Calcium: 9.4 mg/dL (ref 8.9–10.3)
GFR calc Af Amer: 3 mL/min — ABNORMAL LOW (ref >60)
GFR calc non Af Amer: 3 mL/min — ABNORMAL LOW (ref >60)
GLUCOSE: 80 mg/dL (ref 65–99)
Potassium: >7.5 mmol/L (ref 3.5–5.1)
Sodium: 139 mmol/L (ref 135–145)

## 2016-05-22 LAB — POTASSIUM: POTASSIUM: 5.6 mmol/L — AB (ref 3.5–5.1)

## 2016-05-22 LAB — GLUCOSE, CAPILLARY: Glucose-Capillary: 80 mg/dL (ref 65–99)

## 2016-05-22 LAB — LACTIC ACID, PLASMA: Lactic Acid, Venous: 2 mmol/L (ref 0.5–1.9)

## 2016-05-22 LAB — MRSA PCR SCREENING: MRSA by PCR: NEGATIVE

## 2016-05-22 LAB — TROPONIN I
TROPONIN I: 0.18 ng/mL — AB (ref <0.03)
Troponin I: 0.18 ng/mL (ref <0.03)
Troponin I: 0.15 ng/mL (ref <0.03)

## 2016-05-22 MED ORDER — SODIUM CHLORIDE 0.9% FLUSH
3.0000 mL | Freq: Two times a day (BID) | INTRAVENOUS | Status: DC
  Administered 2016-05-22 – 2016-05-23 (×5): 3 mL via INTRAVENOUS

## 2016-05-22 MED ORDER — GUAIFENESIN ER 600 MG PO TB12
600.0000 mg | ORAL_TABLET | Freq: Two times a day (BID) | ORAL | Status: DC
  Administered 2016-05-22 – 2016-05-23 (×4): 600 mg via ORAL
  Filled 2016-05-22 (×4): qty 1

## 2016-05-22 MED ORDER — POLYETHYLENE GLYCOL 3350 17 G PO PACK
17.0000 g | PACK | ORAL | Status: DC
  Filled 2016-05-22 (×2): qty 1

## 2016-05-22 MED ORDER — ASPIRIN 325 MG PO TABS
325.0000 mg | ORAL_TABLET | ORAL | Status: DC
  Administered 2016-05-22 – 2016-05-24 (×3): 325 mg via ORAL
  Filled 2016-05-22 (×4): qty 1

## 2016-05-22 MED ORDER — DOCUSATE SODIUM 100 MG PO CAPS
100.0000 mg | ORAL_CAPSULE | Freq: Two times a day (BID) | ORAL | Status: DC
  Administered 2016-05-22 (×3): 100 mg via ORAL
  Filled 2016-05-22 (×5): qty 1

## 2016-05-22 MED ORDER — ONDANSETRON HCL 4 MG PO TABS: 4.0000 mg | ORAL_TABLET | Freq: Four times a day (QID) | ORAL | Status: DC | PRN

## 2016-05-22 MED ORDER — ONDANSETRON HCL 4 MG/2ML IJ SOLN: 4.0000 mg | Freq: Four times a day (QID) | INTRAMUSCULAR | Status: DC | PRN

## 2016-05-22 MED ORDER — ATORVASTATIN CALCIUM 10 MG PO TABS
10.0000 mg | ORAL_TABLET | ORAL | Status: DC
  Administered 2016-05-22 – 2016-05-24 (×3): 10 mg via ORAL
  Filled 2016-05-22 (×4): qty 1

## 2016-05-22 MED ORDER — PANTOPRAZOLE SODIUM 40 MG PO TBEC
40.0000 mg | DELAYED_RELEASE_TABLET | Freq: Every day | ORAL | Status: DC
  Administered 2016-05-22 – 2016-05-23 (×2): 40 mg via ORAL
  Filled 2016-05-22 (×2): qty 1

## 2016-05-22 MED ORDER — PANTOPRAZOLE SODIUM 20 MG PO TBEC: 20.0000 mg | DELAYED_RELEASE_TABLET | Freq: Every day | ORAL | Status: DC

## 2016-05-22 MED ORDER — METOPROLOL TARTRATE 25 MG PO TABS
25.0000 mg | ORAL_TABLET | Freq: Two times a day (BID) | ORAL | Status: DC
  Administered 2016-05-22 – 2016-05-23 (×5): 25 mg via ORAL
  Filled 2016-05-22 (×5): qty 1

## 2016-05-22 MED ORDER — ALBUTEROL SULFATE (2.5 MG/3ML) 0.083% IN NEBU: 2.5000 mg | INHALATION_SOLUTION | Freq: Four times a day (QID) | RESPIRATORY_TRACT | Status: DC | PRN

## 2016-05-22 MED ORDER — LEVOFLOXACIN IN D5W 750 MG/150ML IV SOLN
750.0000 mg | Freq: Once | INTRAVENOUS | Status: AC
  Administered 2016-05-22: 750 mg via INTRAVENOUS
  Filled 2016-05-22: qty 150

## 2016-05-22 MED ORDER — OXYCODONE HCL 5 MG PO TABA: 1.0000 | ORAL_TABLET | Freq: Three times a day (TID) | ORAL | Status: DC | PRN

## 2016-05-22 MED ORDER — OXYCODONE-ACETAMINOPHEN 7.5-325 MG PO TABS: 1.0000 | ORAL_TABLET | ORAL | Status: DC | PRN

## 2016-05-22 MED ORDER — NEPRO/CARBSTEADY PO LIQD
237.0000 mL | Freq: Every day | ORAL | Status: DC
  Administered 2016-05-22 – 2016-05-23 (×2): 237 mL via ORAL

## 2016-05-22 MED ORDER — CALCIUM ACETATE (PHOS BINDER) 667 MG PO CAPS
667.0000 mg | ORAL_CAPSULE | Freq: Three times a day (TID) | ORAL | Status: DC
  Administered 2016-05-22 – 2016-05-24 (×5): 667 mg via ORAL
  Filled 2016-05-22 (×6): qty 1

## 2016-05-22 MED ORDER — MIDODRINE HCL 5 MG PO TABS
5.0000 mg | ORAL_TABLET | ORAL | Status: DC
  Administered 2016-05-22: 5 mg via ORAL
  Filled 2016-05-22 (×2): qty 1

## 2016-05-22 MED ORDER — RENA-VITE PO TABS
1.0000 | ORAL_TABLET | Freq: Every day | ORAL | Status: DC
  Administered 2016-05-22 – 2016-05-23 (×3): 1 via ORAL
  Filled 2016-05-22 (×3): qty 1

## 2016-05-22 MED ORDER — IPRATROPIUM-ALBUTEROL 0.5-2.5 (3) MG/3ML IN SOLN
3.0000 mL | Freq: Four times a day (QID) | RESPIRATORY_TRACT | Status: DC | PRN
Start: 2016-05-22 — End: 2016-05-24

## 2016-05-22 MED ORDER — SEVELAMER CARBONATE 800 MG PO TABS
800.0000 mg | ORAL_TABLET | Freq: Every day | ORAL | Status: DC
  Administered 2016-05-22 – 2016-05-23 (×2): 800 mg via ORAL
  Filled 2016-05-22 (×2): qty 1

## 2016-05-22 MED ORDER — LEVOFLOXACIN IN D5W 500 MG/100ML IV SOLN: 500.0000 mg | INTRAVENOUS | Status: DC

## 2016-05-22 MED ORDER — OXYCODONE HCL 5 MG PO TABS: 5.0000 mg | ORAL_TABLET | Freq: Three times a day (TID) | ORAL | Status: DC | PRN

## 2016-05-22 MED ORDER — DEXTROMETHORPHAN POLISTIREX ER 30 MG/5ML PO SUER
30.0000 mg | Freq: Two times a day (BID) | ORAL | Status: DC
  Administered 2016-05-22 – 2016-05-23 (×4): 30 mg via ORAL
  Filled 2016-05-22 (×7): qty 5

## 2016-05-22 MED ORDER — ZOLPIDEM TARTRATE 5 MG PO TABS: 5.0000 mg | ORAL_TABLET | Freq: Every evening | ORAL | Status: DC | PRN

## 2016-05-22 MED ORDER — IPRATROPIUM-ALBUTEROL 0.5-2.5 (3) MG/3ML IN SOLN: 3.0000 mL | RESPIRATORY_TRACT | Status: DC | PRN

## 2016-05-22 MED ORDER — MOMETASONE FURO-FORMOTEROL FUM 200-5 MCG/ACT IN AERO
2.0000 | INHALATION_SPRAY | Freq: Two times a day (BID) | RESPIRATORY_TRACT | Status: DC
  Administered 2016-05-22 – 2016-05-24 (×6): 2 via RESPIRATORY_TRACT
  Filled 2016-05-22: qty 8.8

## 2016-05-22 MED ORDER — SODIUM CHLORIDE 0.9 % IV SOLN
125.0000 mg | INTRAVENOUS | Status: DC
  Administered 2016-05-22 – 2016-05-24 (×3): 125 mg via INTRAVENOUS
  Filled 2016-05-22 (×3): qty 10

## 2016-05-22 MED ORDER — ACETAMINOPHEN 500 MG PO TABS: 1000.0000 mg | ORAL_TABLET | Freq: Four times a day (QID) | ORAL | Status: DC | PRN

## 2016-05-22 MED ORDER — GUAIFENESIN 100 MG/5ML PO SOLN
200.0000 mg | Freq: Three times a day (TID) | ORAL | Status: DC | PRN
  Administered 2016-05-22 – 2016-05-23 (×2): 200 mg via ORAL
  Filled 2016-05-22 (×2): qty 10

## 2016-05-22 MED ORDER — PREGABALIN 75 MG PO CAPS
75.0000 mg | ORAL_CAPSULE | Freq: Two times a day (BID) | ORAL | Status: DC
  Administered 2016-05-22 – 2016-05-23 (×4): 75 mg via ORAL
  Filled 2016-05-22 (×4): qty 1

## 2016-05-22 MED ORDER — TIOTROPIUM BROMIDE MONOHYDRATE 18 MCG IN CAPS
18.0000 ug | ORAL_CAPSULE | Freq: Every day | RESPIRATORY_TRACT | Status: DC
  Administered 2016-05-22 – 2016-05-24 (×3): 18 ug via RESPIRATORY_TRACT
  Filled 2016-05-22: qty 5

## 2016-05-22 MED ORDER — HEPARIN SODIUM (PORCINE) 5000 UNIT/ML IJ SOLN
5000.0000 [IU] | Freq: Three times a day (TID) | INTRAMUSCULAR | Status: DC
  Administered 2016-05-22 – 2016-05-24 (×8): 5000 [IU] via SUBCUTANEOUS
  Filled 2016-05-22 (×8): qty 1

## 2016-05-22 MED ORDER — DOXERCALCIFEROL 4 MCG/2ML IV SOLN
2.5000 ug | INTRAVENOUS | Status: DC
Start: 2016-05-22 — End: 2016-05-24
  Administered 2016-05-22 – 2016-05-24 (×3): 2.5 ug via INTRAVENOUS
  Filled 2016-05-22 (×3): qty 2

## 2016-05-22 MED ORDER — DM-GUAIFENESIN ER 30-600 MG PO TB12: 1.0000 | ORAL_TABLET | Freq: Two times a day (BID) | ORAL | Status: DC

## 2016-05-22 MED ORDER — ORAL CARE MOUTH RINSE
15.0000 mL | Freq: Two times a day (BID) | OROMUCOSAL | Status: DC
  Administered 2016-05-22 (×2): 15 mL via OROMUCOSAL

## 2016-05-22 MED ORDER — ALBUTEROL SULFATE HFA 108 (90 BASE) MCG/ACT IN AERS: 2.0000 | INHALATION_SPRAY | Freq: Four times a day (QID) | RESPIRATORY_TRACT | Status: DC | PRN

## 2016-05-22 NOTE — Progress Notes (Signed)
Start of hd 

## 2016-05-22 NOTE — Progress Notes (Signed)
Pre hd info 

## 2016-05-22 NOTE — Progress Notes (Signed)
[facility]  ROUNDING NOTE   Subjective:   [name] admitted to [facility] on 05/21/2016 Hyperkalemia [E87.5] Acute pulmonary edema (HCC) [J81.0] SOB (shortness of breath) [R06.02] Elevated troponin [R74.8] Acute on chronic congestive heart failure, unspecified congestive heart failure type ([place]) [I50.9]  Last hemodialysis treatment was Friday. Due to transportation, was unable to get dialysis on the holiday schedule.   Emergent hemodialysis last night for hyperkalemia.   Objective:  Vital signs in last 24 hours:  Temp:  [97 F (36.1 C)-97.7 F (36.5 C)] 97 F (36.1 C) (12/27 0710) Pulse Rate:  [25-63] 62 (12/27 0905) Resp:  [12-28] 12 (12/27 0905) BP: (116-158)/(86-108) 125/93 (12/27 0905) SpO2:  [93 %-100 %] 99 % (12/27 0905) FiO2 (%):  [30 %] 30 % (12/27 0100) Weight:  [71.2 kg (157 lb)-83.7 kg (184 lb 8.4 oz)] 83.7 kg (184 lb 8.4 oz) (12/27 0710)  Weight change:  Filed Weights   05/21/16 1951 05/22/16 0046 05/22/16 0710  Weight: 71.2 kg (157 lb) 82.7 kg (182 lb 5.1 oz) 83.7 kg (184 lb 8.4 oz)    Intake/Output: I/O last 3 completed shifts: In: 150 [IV Piggyback:150] Out: -    Intake/Output this shift:  No intake/output data recorded.  Physical Exam: General: Critically ill appearing  Head: Normocephalic, atraumatic. Moist oral mucosal membranes  Eyes: Anicteric, PERRL  Neck: Supple, trachea midline  Lungs:  Bilateral crackles  Heart: Regular rate and rhythm  Abdomen:  Soft, nontender  Extremities: 1+ peripheral edema.  Neurologic: Nonfocal, moving all four extremities  Skin: No lesions  Access: RIJ permcath, right arm AVF not functioning    Basic Metabolic Panel:  Recent Labs Lab 05/21/16 1954  NA 134*  K >7.5*  CL 99*  CO2 17*  GLUCOSE 105*  BUN 160*  CREATININE 14.14*  CALCIUM 9.6    Liver Function Tests:  Recent Labs Lab 05/21/16 1954  AST 46*  ALT 46  ALKPHOS 84  BILITOT 0.7  PROT 9.1*  ALBUMIN 3.5   No  results for input(s): LIPASE, AMYLASE in the last 168 hours. No results for input(s): AMMONIA in the last 168 hours.  CBC:  Recent Labs Lab 05/21/16 1954 05/22/16 0718  WBC 8.6 8.6  NEUTROABS 5.7  --   HGB 12.8* 11.6*  HCT 38.5* 34.7*  MCV 91.3 90.7  PLT 170 149*    Cardiac Enzymes:  Recent Labs Lab 05/21/16 1954 05/22/16 0614 05/22/16 0718  TROPONINI 0.19* 0.18* 0.18*    BNP: Invalid input(s): POCBNP  CBG:  Recent Labs Lab 05/22/16 0026  GLUCAP 55    Microbiology: Results for orders placed or performed during the hospital encounter of 05/21/16  MRSA PCR Screening     Status: None   Collection Time: 05/22/16 12:40 AM  Result Value Ref Range Status   MRSA by PCR NEGATIVE NEGATIVE Final    Comment:        The GeneXpert MRSA Assay (FDA approved for NASAL specimens only), is one component of a comprehensive MRSA colonization surveillance program. It is not intended to diagnose MRSA infection nor to guide or monitor treatment for MRSA infections.     Coagulation Studies: No results for input(s): LABPROT, INR in the last 72 hours.  Urinalysis: No results for input(s): COLORURINE, LABSPEC, PHURINE, GLUCOSEU, HGBUR, BILIRUBINUR, KETONESUR, PROTEINUR, UROBILINOGEN, NITRITE, LEUKOCYTESUR in the last 72 hours.  Invalid input(s): APPERANCEUR    Imaging: Dg Chest 2 View  Result Date: 05/21/2016 CLINICAL DATA:  Intermittent shortness of breath for  2 days with weakness. End-stage renal disease. EXAM: CHEST  2 VIEW COMPARISON:  04/09/2016. FINDINGS: Cardiomegaly. Dialysis catheter tips SVC RA. Large RIGHT pleural effusion. BILATERAL pulmonary opacities are increased consistent with pulmonary edema. Focal opacity at the RIGHT base laterally could represent atelectasis or consolidation. Worsening aeration from priors. No pneumothorax. IMPRESSION: Interval development of pulmonary edema. Cardiomegaly with dialysis catheter, unchanged. Focal opacity at the RIGHT  base, possible superimposed atelectasis or consolidation Electronically Signed   By: Staci Righter M.D.   On: 05/21/2016 20:47     Medications:    . aspirin  325 mg Oral BH-q7a  . atorvastatin  10 mg Oral BH-q7a  . calcium acetate  667 mg Oral TID WC  . guaiFENesin  600 mg Oral BID   And  . dextromethorphan  30 mg Oral BID  . docusate sodium  100 mg Oral BID  . doxercalciferol  2.5 mcg Intravenous Q M,W,F-HD  . feeding supplement (NEPRO CARB STEADY)  237 mL Oral Daily  . ferric gluconate (FERRLECIT/NULECIT) IV  125 mg Intravenous Q M,W,F-HD  . furosemide  40 mg Intravenous Once  . heparin  5,000 Units Subcutaneous Q8H  . [START ON 05/24/2016] levofloxacin (LEVAQUIN) IV  500 mg Intravenous Q48H  . mouth rinse  15 mL Mouth Rinse BID  . metoprolol tartrate  25 mg Oral BID  . midodrine  5 mg Oral Once per day on Mon Wed Fri  . mometasone-formoterol  2 puff Inhalation BID  . multivitamin  1 tablet Oral QHS  . pantoprazole  40 mg Oral Daily  . polyethylene glycol  17 g Oral BH-q7a  . pregabalin  75 mg Oral BID  . sevelamer carbonate  800 mg Oral Daily  . sodium chloride flush  3 mL Intravenous Q12H  . tiotropium  18 mcg Inhalation Daily   acetaminophen, albuterol, guaiFENesin, ipratropium-albuterol, ipratropium-albuterol, ondansetron **OR** ondansetron (ZOFRAN) IV, oxyCODONE, oxyCODONE-acetaminophen, zolpidem  Assessment/ Plan:  [name] is a 60 y.o. black male with ESRD on hemodialysis, hypertension, hyperlipidemia, anemia, COPD, peripheral vascular disease, left BKA, right toe amputations  MWF [facility] Nephrology [facility].   1.  End stage renal disease with severe hyperkalemia - Emergent hemodialysis today.  - Resume MWF schedule but will monitor daily for dialysis need.   2.  Anemia of chronic kidney disease: hemoglobin 11.6 - EPO as outpatient.   3. Hypertension: at goal.  - restart fursoemide and metoprolol - gets midodrine before dialysis treatments.    4. Secondary Hyperparathyroidism: - calcium acetate with meals.    LOS: [place], Dwanda Tufano 12/27/20179:19 AM

## 2016-05-22 NOTE — Progress Notes (Signed)
Post hd vitals 

## 2016-05-22 NOTE — Progress Notes (Signed)
K+ now down to 5.6 from 7.5 after dialysis. No oxygen needed after dialysis- Sats. Are 96-97 on room air. No shortness of breath noted. Transferred to  Room 220 via bed. Report called to [place]. On 2C.

## 2016-05-22 NOTE — Progress Notes (Signed)
Post hd assessment 

## 2016-05-22 NOTE — Progress Notes (Signed)
[facility] NAME: [name]    MR#:  [id number]  DATE OF BIRTH:  [birth year]  SUBJECTIVE:  CHIEF COMPLAINT:   Chief Complaint  Patient presents with  . Shortness of Breath   The patient is on dialysis this morning. He is off BiPAP, on O2 [place] 2L.  REVIEW OF SYSTEMS:  Review of Systems  Constitutional: Positive for malaise/fatigue. Negative for chills and fever.  HENT: Negative for congestion and sore throat.   Eyes: Negative for blurred vision and double vision.  Respiratory: Negative for cough, shortness of breath and stridor.   Cardiovascular: Negative for chest pain and leg swelling.  Gastrointestinal: Negative for abdominal pain, diarrhea, nausea and vomiting.  Musculoskeletal: Negative for joint pain.  Neurological: Positive for weakness. Negative for dizziness, focal weakness and loss of consciousness.  Psychiatric/Behavioral: Negative for depression. The patient is not nervous/anxious.     DRUG ALLERGIES:   Allergies  Allergen Reactions  . Dust Mite Extract    VITALS:  Blood pressure 132/76, pulse 64, temperature 97 F (36.1 C), temperature source Oral, resp. rate 16, height 6\' 2"  (1.88 m), weight 184 lb 8.4 oz (83.7 kg), SpO2 98 %. PHYSICAL EXAMINATION:  Physical Exam  Constitutional: He is oriented to person, place, and time and well-developed, well-nourished, and in no distress.  HENT:  Head: Normocephalic.  Eyes: Conjunctivae and EOM are normal.  Neck: Neck supple. No JVD present. No tracheal deviation present.  Cardiovascular: Normal rate, regular rhythm and normal heart sounds.  Exam reveals no gallop.   No murmur heard. Pulmonary/Chest: Effort normal and breath sounds normal. No respiratory distress. He has no wheezes. He has no rales.  Abdominal: Soft. Bowel sounds are normal. He exhibits no distension. There is no tenderness.  Musculoskeletal: He exhibits no edema or tenderness.  Left BKA, right  toes amputation.  Neurological: He is alert and oriented to person, place, and time. No cranial nerve deficit.  Skin: No rash noted. No erythema.   LABORATORY PANEL:   CBC  Recent Labs Lab 05/22/16 0718  WBC 8.6  HGB 11.6*  HCT 34.7*  PLT 149*   ------------------------------------------------------------------------------------------------------------------ Chemistries   Recent Labs Lab 05/21/16 1954 05/22/16 0718 05/22/16 1224  NA 134* 139  --   K >7.5* >7.5* 5.6*  CL 99* 104  --   CO2 17* 14*  --   GLUCOSE 105* 80  --   BUN 160* 161*  --   CREATININE 14.14* 15.84*  --   CALCIUM 9.6 9.4  --   AST 46*  --   --   ALT 46  --   --   ALKPHOS 84  --   --   BILITOT 0.7  --   --    RADIOLOGY:  Dg Chest 2 View  Result Date: 05/21/2016 CLINICAL DATA:  Intermittent shortness of breath for 2 days with weakness. End-stage renal disease. EXAM: CHEST  2 VIEW COMPARISON:  04/09/2016. FINDINGS: Cardiomegaly. Dialysis catheter tips SVC RA. Large RIGHT pleural effusion. BILATERAL pulmonary opacities are increased consistent with pulmonary edema. Focal opacity at the RIGHT base laterally could represent atelectasis or consolidation. Worsening aeration from priors. No pneumothorax. IMPRESSION: Interval development of pulmonary edema. Cardiomegaly with dialysis catheter, unchanged. Focal opacity at the RIGHT base, possible superimposed atelectasis or consolidation Electronically Signed   By: Staci Righter M.D.   On: 05/21/2016 20:47   ASSESSMENT AND PLAN:   This is a 60 y.o. male with  a history of Systolic CHF EF 20, Cardiomyopathy, AI with bicuspid aortic valve, CAD with CABG and stents, COPD, chronic pulmonary hypertension, Left BKA, ESRD on dialysis now being admitted with:  1. Acute respiratory failure due to Pulmonary Edema secondary to fluid overload and missed dialysis - H/O chronic pulmonary HTN -off Bipap, on O2 [place] 2L. continue Lasix, NEB. HD this am.  * Hyperkalemia. K>7.5  this am, on HD. Follow up BMP.  2. ESRD with electrolyte abnormalities Continue Hemodialysis  3. Anemia - Currently stable  4. H/O COPD - Proventil, Duonebs, Dulera, Spiriva  5. H/O Insulin Dependent DM - Sliding scale insulin  6. Elevated troponin, likely demand ischemia.   - Already on aspirin, statin, beta blocker.  All the records are reviewed and case discussed with Care Management/Social Worker. Management plans discussed with the patient, family and they are in agreement.  CODE STATUS: Full code  TOTAL Critical TIME TAKING CARE OF THIS PATIENT: 41 minutes.   More than 50% of the time was spent in counseling/coordination of care: YES  POSSIBLE D/C IN 3 DAYS, DEPENDING ON CLINICAL CONDITION.   Demetrios Loll M.D on 05/22/2016 at 1:11 PM  Between 7am to 6pm - Pager - [phone]  After 6pm go to [email or web address] - Proofreader  [facility]  Office  [phone]  CC: Primary care physician; [facility]  Note: This dictation was prepared with Dragon dictation along with smaller phrase technology. Any transcriptional errors that result from this process are unintentional.

## 2016-05-22 NOTE — Progress Notes (Signed)
Unable to collect sputum at this time. Pt doesn't feel that he has anything to cough up.

## 2016-05-22 NOTE — Progress Notes (Signed)
Chaplain rounded the unit to provide a compassionate presence and support to the family. Minerva Fester [phone]

## 2016-05-22 NOTE — Progress Notes (Signed)
  End of hd 

## 2016-05-22 NOTE — Progress Notes (Signed)
Pharmacy Antibiotic Note  [name] is a 60 y.o. male admitted on 05/21/2016 with pneumonia.  Pharmacy has been consulted for Levaquin dosing.  Plan: Levaquin 750 mg x1 and then 500 mg q 48 hours ordered  Height: 6\' 2"  (188 cm) Weight: 182 lb 5.1 oz (82.7 kg) IBW/kg (Calculated) : 82.2  Temp (24hrs), Avg:97.7 F (36.5 C), Min:97.7 F (36.5 C), Max:97.7 F (36.5 C)   Recent Labs Lab 05/21/16 1954 05/21/16 2046  WBC 8.6  --   CREATININE 14.14*  --   LATICACIDVEN  --  1.2    Estimated Creatinine Clearance: 6.3 mL/min (by C-G formula based on SCr of 14.14 mg/dL (H)).    Allergies  Allergen Reactions  . Dust Mite Extract     Antimicrobials this admission: Levaquin 12/27 >>    >>   Dose adjustments this admission:   Microbiology results:  12/27 Sputum: pending  12/27 MRSA PCR: pending    12/26 CXR: R base opacity  Thank you for allowing pharmacy to be a part of this patient's care.  Xzayvion Vaeth S 05/22/2016 1:30 AM

## 2016-05-22 NOTE — Progress Notes (Signed)
Pre hd assessment  

## 2016-05-22 NOTE — Progress Notes (Signed)
PT Cancellation Note  Patient Details Name: [name] MRN: [id number] DOB: Sep 13, 1955   Cancelled Treatment:    Reason Eval/Treat Not Completed: Medical issues which prohibited therapy.  Pt's potassium was elevated to >7.5 this morning and recently noted around noon to be down to 5.6.  D/t pt's potassium still being elevated, per PT guidelines (for elevated potassium) will hold PT at this time and re-attempt PT eval at a later date/time as medically appropriate.   Raquel Sarna Story Vanvranken 05/22/2016, 1:45 PM Leitha Bleak, [place]

## 2016-05-22 NOTE — H&P (Addendum)
[place] [facility] Admission History and Physical [employer], D.O.    Patient Name: [name] MR#: [id number] Date of Birth: [birth year] Date of Admission: 05/21/2016  Referring MD/NP/PA: Conni Slipper MD Primary Care Physician: [facility]: Kathlyn Sacramento, MD, Anthonette Legato, MD Patient coming from: [place]  Chief Complaint: Short of Breath   HPI: [name] is a 60 y.o. male with a known history of CHF EF 85, CAD, COPD, ESRD on dialysis presents to the emergency department for evaluation of shortness of breath. Patient was in a usual state of health until 2 days prior to admission when he began to gradually get increasingly short of breath. He feels as if he cannot catch his breath and is experiencing reduced mobility, increased work of breathing, fatigue and weakness. He has a nonproductive cough. He denies fevers, chills, nausea, vomiting, diarrhea, chest pain, diaphoresis, abdominal pain or change in mental status.    Patient is on dialysis 3x per week. He missed his Sunday dialysis this week and has not been able to be dialyzed yet. Patient has been taking medication as prescribed and there has been no recent change in medication or diet.  There has been no recent illness, travel or sick contacts.     ED Course: He was given Proventil, Lasix, Insulin, Sodium Bicarbonate, and placed on Bipap.   Review of Systems:  CONSTITUTIONAL: Positive for fatigue, weakness. No fever/chills, weight gain/loss, headache. EYES: No blurry or double vision. ENT: No tinnitus, postnasal drip, redness or soreness of the oropharynx. RESPIRATORY: Positive for cough, dyspnea, wheeze. No hemoptysis.  CARDIOVASCULAR: No chest pain, palpitations, syncope, orthopnea,  GASTROINTESTINAL: No nausea, vomiting, abdominal pain, constipation, diarrhea.  No hematemesis, melena or hematochezia. GENITOURINARY: No dysuria, frequency,  hematuria. ENDOCRINE: No polyuria or nocturia. No heat or cold intolerance. HEMATOLOGY: No anemia, bruising, bleeding. INTEGUMENTARY: No rashes, ulcers, lesions. MUSCULOSKELETAL: No arthritis, gout, dyspnea.  NEUROLOGIC: No numbness, tingling, ataxia, seizure-type activity, weakness. PSYCHIATRIC: No anxiety, depression, insomnia.   Past Medical History:  Diagnosis Date  . Anemia   . Asthma   . CHF (congestive heart failure) ([place])   . Chronic systolic heart failure ([place])   . Complication of anesthesia    hypotension  . COPD (chronic obstructive pulmonary disease) ([place])   . Coronary artery disease   . Dialysis patient ([place])    Mon, Wed, Fri  . End stage renal disease ([place])   . GERD (gastroesophageal reflux disease)   . Headache   . History of kidney stones   . HLD (hyperlipidemia)   . HTN (hypertension)   . Hyperparathyroidism   . Myocardial infarction   . Peripheral vascular disease ([place])   . Shortness of breath dyspnea   . Sleep apnea   . Tobacco dependence     Past Surgical History:  Procedure Laterality Date  . AMPUTATION Left 05/06/2014   Procedure: AMPUTATION BELOW KNEE;  Surgeon: Elam Dutch, MD;  Location: [place] [facility];  Service: Vascular;  Laterality: Left;  . AMPUTATION Right 01/12/2015   Procedure: Foot transmetatarsal amputation;  Surgeon: Algernon Huxley, MD;  Location: [facility];  Service: Vascular;  Laterality: Right;  . APPLICATION OF WOUND VAC Right 03/01/2015   Procedure: Application of Bio-connekt graft and wound vac application to right foot ;  Surgeon: Algernon Huxley, MD;  Location: [facility];  Service: Vascular;  Laterality: Right;  . AV FISTULA PLACEMENT Left   . CARDIAC CATHETERIZATION  stent placement   . CORONARY ANGIOPLASTY    . LIGATION OF ARTERIOVENOUS  FISTULA Right 01/31/2016   Procedure: LIGATION OF ARTERIOVENOUS  FISTULA;  Surgeon: Algernon Huxley, MD;  Location: [facility];  Service: Vascular;  Laterality: Right;  . PERIPHERAL VASCULAR CATHETERIZATION  Right 12/15/2014   Procedure: Lower Extremity Angiography;  Surgeon: Algernon Huxley, MD;  Location: [place] [facility];  Service: Cardiovascular;  Laterality: Right;  . PERIPHERAL VASCULAR CATHETERIZATION  12/15/2014   Procedure: Lower Extremity Intervention;  Surgeon: Algernon Huxley, MD;  Location: [facility];  Service: Cardiovascular;;  . PERIPHERAL VASCULAR CATHETERIZATION Right 08/14/2015   Procedure: A/V Shuntogram/Fistulagram;  Surgeon: Algernon Huxley, MD;  Location: [place];  Service: Cardiovascular;  Laterality: Right;  . PERIPHERAL VASCULAR CATHETERIZATION N/A 08/14/2015   Procedure: A/V Shunt Intervention;  Surgeon: Algernon Huxley, MD;  Location: [facility];  Service: Cardiovascular;  Laterality: N/A;  . PERIPHERAL VASCULAR CATHETERIZATION N/A 01/11/2016   Procedure: Dialysis/Perma Catheter Insertion;  Surgeon: Algernon Huxley, MD;  Location: [facility];  Service: Cardiovascular;  Laterality: N/A;  . REVISON OF ARTERIOVENOUS FISTULA Right 02/17/2016   Procedure: removal of AV fistula;  Surgeon: Serafina Mitchell, MD;  Location: [facility];  Service: Vascular;  Laterality: Right;  . REVISON OF ARTERIOVENOUS FISTULA Right 01/31/2016   Procedure: REVISON OF ARTERIOVENOUS FISTULA ( BRACHIOCEPHALIC ) W/ ARTEGRAFT;  Surgeon: Algernon Huxley, MD;  Location: [facility];  Service: Vascular;  Laterality: Right;  . TRANSMETATARSAL AMPUTATION Right 05/04/2015   Procedure: TRANSMETATARSAL AMPUTATION REVISION, great toe amputation;  Surgeon: Algernon Huxley, MD;  Location: [facility];  Service: Vascular;  Laterality: Right;     reports that he has been smoking Cigarettes.  He has been smoking about 0.25 packs per day. He has never used smokeless tobacco. He reports that he does not drink alcohol or use drugs.  Allergies  Allergen Reactions  . Dust Mite Extract     Family History  Problem Relation Age of Onset  . Heart failure Other   . Hypertension Other   . Leukemia Other   . Diabetes  Other    Family history has been reviewed and confirmed with patient.   Prior to Admission medications   Medication Sig Start Date End Date Taking? Authorizing Provider  acetaminophen (TYLENOL) 500 MG tablet Take 1,000 mg by mouth every 6 (six) hours as needed for moderate pain.   Yes Historical Provider, MD  albuterol (PROVENTIL HFA;VENTOLIN HFA) 108 (90 BASE) MCG/ACT inhaler Inhale 2 puffs into the lungs every 6 (six) hours as needed for wheezing or shortness of breath.    Yes Historical Provider, MD  aspirin 325 MG tablet Take 325 mg by mouth every morning.    Yes Historical Provider, MD  atorvastatin (LIPITOR) 10 MG tablet Take 10 mg by mouth every morning.    Yes Historical Provider, MD  budesonide-formoterol (SYMBICORT) 160-4.5 MCG/ACT inhaler Inhale 2 puffs into the lungs 2 (two) times daily. Reported on 08/14/2015   Yes Historical Provider, MD  calcium acetate (PHOSLO) 667 MG capsule Take 667 mg by mouth 3 (three) times daily with meals.   Yes Historical Provider, MD  docusate sodium (COLACE) 100 MG capsule Take 100 mg by mouth 2 (two) times daily. Reported on 08/14/2015   Yes Historical Provider, MD  guaiFENesin (ROBITUSSIN) 100 MG/5ML liquid Take 200 mg by mouth 3 (three) times daily as needed for cough.   Yes Historical Provider,  MD  ipratropium-albuterol (DUONEB) 0.5-2.5 (3) MG/3ML SOLN Take 3 mLs by nebulization every 4 (four) hours as needed (SOB).   Yes Historical Provider, MD  Layla Barter 15 GM/60ML suspension Take 60 mLs by mouth once. 05/01/16  Yes Historical Provider, MD  metoprolol tartrate (LOPRESSOR) 25 MG tablet Take 25 mg by mouth 2 (two) times daily.     Yes Historical Provider, MD  multivitamin (RENA-VIT) TABS tablet Take 1 tablet by mouth at bedtime. 05/10/14  Yes Shanker Kristeen Mans, MD  Nutritional Supplements (FEEDING SUPPLEMENT, NEPRO CARB STEADY,) LIQD Take 237 mLs by mouth daily.   Yes Historical Provider, MD  oxyCODONE-acetaminophen (PERCOCET) 7.5-325 MG tablet Take 1  tablet by mouth every 4 (four) hours as needed for severe pain. 01/31/16  Yes Algernon Huxley, MD  polyethylene glycol (MIRALAX / GLYCOLAX) packet Take 17 g by mouth every morning. Hold for loose stools.   Yes Historical Provider, MD  pregabalin (LYRICA) 75 MG capsule Take 75 mg by mouth 2 (two) times daily.   Yes Historical Provider, MD  sevelamer carbonate (RENVELA) 800 MG tablet Take 800 mg by mouth daily.    Yes Historical Provider, MD  tiotropium (SPIRIVA) 18 MCG inhalation capsule Place 18 mcg into inhaler and inhale daily.   Yes Historical Provider, MD  Darbepoetin Alfa (ARANESP) 40 MCG/0.4ML SOSY injection Inject 0.4 mLs (40 mcg total) into the vein every Monday with hemodialysis. Patient not taking: Reported on 03/14/2016 05/16/14   Jonetta Osgood, MD  doxercalciferol (HECTOROL) 4 MCG/2ML injection Inject 1.25 mLs (2.5 mcg total) into the vein every Monday, Wednesday, and Friday with hemodialysis. 05/10/14   Shanker Kristeen Mans, MD  ferric gluconate 125 mg in sodium chloride 0.9 % 100 mL Inject 125 mg into the vein every Monday, Wednesday, and Friday with hemodialysis. 05/10/14   Shanker Kristeen Mans, MD  midodrine (PROAMATINE) 5 MG tablet Take 5 mg by mouth. 3 times a week, 30 minutes before dialysis    Historical Provider, MD  moxifloxacin (AVELOX) 400 MG tablet Take 400 mg by mouth every morning. For 10 days, staring 03/12/2016.    Historical Provider, MD  OxyCODONE HCl, Abuse Deter, (OXAYDO) 5 MG TABA Take 1 tablet by mouth every 8 (eight) hours as needed.    Historical Provider, MD  pantoprazole (PROTONIX) 20 MG tablet Take 20 mg by mouth daily.    Historical Provider, MD  simvastatin (ZOCOR) 10 MG tablet Take 2 tablets by mouth every evening. Change in therapy 12/23/14   Historical Provider, MD  triamcinolone cream (KENALOG) 0.1 % Apply 1 application topically daily as needed. 11/07/14   Historical Provider, MD    Physical Exam: Vitals:   05/21/16 2230 05/21/16 2300 05/21/16 2330 05/22/16  0000  BP: (!) 150/108 (!) 132/105 (!) 130/100 (!) 124/95  Pulse: (!) 59 (!) 25 60 (!) 59  Resp: 20 20 (!) 25 18  Temp:      TempSrc:      SpO2: 100% 100% 100% 100%  Weight:      Height:        GENERAL: 60 y.o.-year-old african american patient, well-developed, well-nourished lying in the bed in no acute distress.  Pleasant and cooperative.   HEENT: Head atraumatic, normocephalic. Pupils equal, round, reactive to light and accommodation. No scleral icterus. Extraocular muscles intact. Nares are patent. Oropharynx is clear. Mucus membranes moist. NECK: Supple, full range of motion. No JVD, no bruit heard. No thyroid enlargement, no tenderness, no cervical lymphadenopathy. CHEST: Bibasilar crackles.  CARDIOVASCULAR: S1,  S2 normal. No murmurs, rubs, or gallops. Cap refill <2 seconds. Pulses intact distally.  ABDOMEN: Soft, nondistended, nontender, . No rebound, guarding, rigidity. Normoactive bowel sounds present in all four quadrants. No organomegaly or mass. EXTREMITIES: No pedal edema, cyanosis, or clubbing. Left BKA.  NEUROLOGIC: Cranial nerves II through XII are grossly intact with no focal sensorimotor deficit. Muscle strength 5/5 in all extremities. Sensation intact. Gait not checked. PSYCHIATRIC: The patient is alert and oriented x 3. Normal affect, mood, thought content. SKIN: Warm, dry, and intact without obvious rash, lesion, or ulcer.   Labs on Admission: I have personally reviewed following labs and imaging studies  CBC:  Recent Labs Lab 05/21/16 1954  WBC 8.6  NEUTROABS 5.7  HGB 12.8*  HCT 38.5*  MCV 91.3  PLT 161   Basic Metabolic Panel:  Recent Labs Lab 05/21/16 1954  NA 134*  K >7.5*  CL 99*  CO2 17*  GLUCOSE 105*  BUN 160*  CREATININE 14.14*  CALCIUM 9.6   GFR: Estimated Creatinine Clearance: 5.5 mL/min (by C-G formula based on SCr of 14.14 mg/dL (H)). Liver Function Tests:  Recent Labs Lab 05/21/16 1954  AST 46*  ALT 46  ALKPHOS 84  BILITOT  0.7  PROT 9.1*  ALBUMIN 3.5    Recent Labs Lab 05/21/16 1954  TROPONINI 0.19*   Urine analysis:    Component Value Date/Time   COLORURINE Yellow 07/06/2013 1727   APPEARANCEUR Hazy 07/06/2013 1727   LABSPEC 1.017 07/06/2013 1727   PHURINE 6.0 07/06/2013 1727   GLUCOSEU 50 mg/dL 07/06/2013 1727   HGBUR 1+ 07/06/2013 1727   BILIRUBINUR Negative 07/06/2013 1727   KETONESUR Negative 07/06/2013 1727   PROTEINUR >=500 07/06/2013 1727   NITRITE Negative 07/06/2013 1727   LEUKOCYTESUR 1+ 07/06/2013 1727   Sepsis Labs: @LABRCNTIP (procalcitonin:4,lacticidven:4) )No results found for this or any previous visit (from the past 240 hour(s)).   Radiological Exams on Admission: Dg Chest 2 View  Result Date: 05/21/2016 CLINICAL DATA:  Intermittent shortness of breath for 2 days with weakness. End-stage renal disease. EXAM: CHEST  2 VIEW COMPARISON:  04/09/2016. FINDINGS: Cardiomegaly. Dialysis catheter tips SVC RA. Large RIGHT pleural effusion. BILATERAL pulmonary opacities are increased consistent with pulmonary edema. Focal opacity at the RIGHT base laterally could represent atelectasis or consolidation. Worsening aeration from priors. No pneumothorax. IMPRESSION: Interval development of pulmonary edema. Cardiomegaly with dialysis catheter, unchanged. Focal opacity at the RIGHT base, possible superimposed atelectasis or consolidation Electronically Signed   By: Staci Righter M.D.   On: 05/21/2016 20:47    EKG: Normal sinus rhythm at 63 bpm with left axis and flipped T waves in lead I and aVL similar to prior ECG done 04/09/16. Left bundle branch block.    Assessment/Plan Active Problems:   Pulmonary edema    This is a 60 y.o. male with a history of Systolic CHF EF 20, Cardiomyopathy, AI with bicuspid aortic valve, CAD with CABG and stents, COPD, chronic pulmonary hypertension, Left BKA, ESRD on dialysis now being admitted with:  1. Pulmonary Edema - Likely 2/2 history of CHF with  missed dialysis - H/O chronic pulmonary HTN - Admit to Step Down - Bipap - Lasix, Proventil - Heart healthy Diet - Sputum Culture - Consult Cardiology for comanagement of multiple comorbidities.   2. ESRD with electrolyte abnormalities - Hemodialysis - Monitor electrolytes - Monitor Urine output - UA with electrolytes - Renal Restriction Diet - Consult Nephrology - Dr. Juleen China  3. Anemia - Currently stable - Monitor  H/H  4. H/O COPD - Bipap - Proventil, Duonebs, Dulera, Spiriva  5. H/O Insulin Dependent DM - Sliding scale insulin - POC glucose  - Diabetic Diet  6. Elevated troponin, likely demand ischemia.   - Already on aspirin, statin, beta blocker. - Trend troponins.    Admission status: Step Down IV Fluids: none Diet/Nutrition: Diabetic, heart healthy, renal diet Consults called: Nephrology, Cardiology  DVT Px: Heparin, SCDs and early ambulation Code Status: Full Code  Disposition Plan: Discharge to SNF in 1-2 days   All the records are reviewed and case discussed with ED provider. Management plans discussed with the patient and/or family who express understanding and agree with plan of care.  Nasser Ku D.O. on 05/22/2016 at 12:21 AM Between 7am to 6pm - Pager - [phone] After 6pm go to [email or web address] - Proofreader [facility] Office [phone] CC: Primary care physician; [place]  05/22/2016, 12:21 AM

## 2016-05-23 LAB — BASIC METABOLIC PANEL
Anion gap: 14 (ref 5–15)
BUN: 106 mg/dL — ABNORMAL HIGH (ref 6–20)
CALCIUM: 8.8 mg/dL — AB (ref 8.9–10.3)
CHLORIDE: 101 mmol/L (ref 101–111)
CO2: 23 mmol/L (ref 22–32)
CREATININE: 11.64 mg/dL — AB (ref 0.61–1.24)
GFR calc non Af Amer: 4 mL/min — ABNORMAL LOW (ref >60)
GFR, EST AFRICAN AMERICAN: 5 mL/min — AB (ref >60)
GLUCOSE: 83 mg/dL (ref 65–99)
Potassium: 5.9 mmol/L — ABNORMAL HIGH (ref 3.5–5.1)
Sodium: 138 mmol/L (ref 135–145)

## 2016-05-23 LAB — HEPATITIS B CORE ANTIBODY, TOTAL: HEP B C TOTAL AB: POSITIVE — AB

## 2016-05-23 LAB — HEPATITIS B SURFACE ANTIBODY,QUALITATIVE: Hep B S Ab: NONREACTIVE

## 2016-05-23 LAB — HEPATITIS B SURFACE ANTIGEN: HEP B S AG: NEGATIVE

## 2016-05-23 LAB — PROCALCITONIN: Procalcitonin: 1.26 ng/mL

## 2016-05-23 MED ORDER — LEVOFLOXACIN 500 MG PO TABS: 500.0000 mg | ORAL_TABLET | ORAL | Status: DC

## 2016-05-23 MED ORDER — PATIROMER SORBITEX CALCIUM 8.4 G PO PACK
8.4000 g | PACK | Freq: Once | ORAL | Status: AC
  Administered 2016-05-23: 8.4 g via ORAL
  Filled 2016-05-23: qty 4

## 2016-05-23 MED ORDER — PREGABALIN 75 MG PO CAPS: 75.0000 mg | ORAL_CAPSULE | Freq: Every day | ORAL | Status: DC

## 2016-05-23 MED ORDER — PREGABALIN 75 MG PO CAPS: 75.0000 mg | ORAL_CAPSULE | ORAL | Status: DC

## 2016-05-23 NOTE — NC FL2 (Signed)
[place] LEVEL OF CARE SCREENING TOOL     IDENTIFICATION  Patient Name: [name] Birthdate: Mar 28, 1956 Sex: male Admission Date (Current Location): 05/21/2016  [place] and [place] Number:  Engineering geologist and Address:  [facility], [address], [place], [place] [address]      Provider Number: [id number]  Attending Physician Name and Address:  Theodoro Grist, MD  Relative Name and Phone Number:       Current Level of Care: Hospital Recommended Level of Care: [place] Prior Approval Number:    Date Approved/Denied:   PASRR Number:    Discharge Plan: SNF    Current Diagnoses: Patient Active Problem List   Diagnosis Date Noted  . Pulmonary edema 05/21/2016  . Kidney dialysis as the cause of abnormal reaction of the patient, or of later complication, without mention of misadventure at the time of the procedure (CODE) 03/05/2016  . PVD (peripheral vascular disease) ([place]) 03/05/2016  . Preoperative cardiovascular examination 05/02/2015  . Chronic systolic heart failure ([place])   . Sepsis due to cellulitis ([place]) 01/13/2015  . Acute encephalopathy 01/13/2015  . Hyperkalemia 01/13/2015  . Gangrene of foot ([place]) 05/04/2014  . ESRD (end stage renal disease) on dialysis ([place]) 05/04/2014  . CAD (coronary artery disease) 05/04/2014  . Normocytic anemia 05/04/2014  . HYPERLIPIDEMIA-MIXED 04/26/2010  . HYPERTENSION, BENIGN 04/26/2010  . CAD, NATIVE VESSEL 04/26/2010  . End stage renal disease ([place]) 04/26/2010    Orientation RESPIRATION BLADDER Height & Weight     Self, Time, Situation, Place  Normal Continent Weight: 184 lb 8.4 oz (83.7 kg) Height:  6\' 2"  (188 cm)  BEHAVIORAL SYMPTOMS/MOOD NEUROLOGICAL BOWEL NUTRITION STATUS   (none)  (none) Continent Diet (renal)  AMBULATORY STATUS COMMUNICATION OF NEEDS Skin   Extensive Assist Verbally Normal                       Personal Care Assistance Level of  Assistance  Bathing, Dressing Bathing Assistance: Independent   Dressing Assistance: Independent     Functional Limitations Info   (no issues)          SPECIAL CARE FACTORS FREQUENCY   (dialysis)                    Contractures Contractures Info: Not present    Additional Factors Info  Isolation Precautions, Code Status Code Status Info: full       Isolation Precautions Info: mrsa     Current Medications (05/23/2016):  This is the current hospital active medication list Current Facility-Administered Medications  Medication Dose Route Frequency Provider Last Rate Last Dose  . acetaminophen (TYLENOL) tablet 1,000 mg  1,000 mg Oral Q6H PRN Alexis Hugelmeyer, DO      . albuterol (PROVENTIL) (2.5 MG/3ML) 0.083% nebulizer solution 2.5 mg  2.5 mg Nebulization Q6H PRN Alexis Hugelmeyer, DO      . aspirin tablet 325 mg  325 mg Oral BH-q7a Alexis Hugelmeyer, DO   325 mg at 05/23/16 0609  . atorvastatin (LIPITOR) tablet 10 mg  10 mg Oral BH-q7a Alexis Hugelmeyer, DO   10 mg at 05/23/16 7035  . calcium acetate (PHOSLO) capsule 667 mg  667 mg Oral TID WC Alexis Hugelmeyer, DO   667 mg at 05/23/16 1357  . guaiFENesin (MUCINEX) 12 hr tablet 600 mg  600 mg Oral BID Alexis Hugelmeyer, DO   600 mg at 05/23/16 0093   And  . dextromethorphan (  DELSYM) 30 MG/5ML liquid 30 mg  30 mg Oral BID Alexis Hugelmeyer, DO   30 mg at 05/23/16 0945  . docusate sodium (COLACE) capsule 100 mg  100 mg Oral BID Alexis Hugelmeyer, DO   100 mg at 05/22/16 2056  . doxercalciferol (HECTOROL) injection 2.5 mcg  2.5 mcg Intravenous Q M,W,F-HD Alexis Hugelmeyer, DO   2.5 mcg at 05/22/16 1100  . feeding supplement (NEPRO CARB STEADY) liquid 237 mL  237 mL Oral Daily Alexis Hugelmeyer, DO   237 mL at 05/23/16 0937  . ferric gluconate (NULECIT) 125 mg in sodium chloride 0.9 % 100 mL IVPB  125 mg Intravenous Q M,W,F-HD Alexis Hugelmeyer, DO   125 mg at 05/22/16 1100  . furosemide (LASIX) injection 40 mg  40 mg  Intravenous Once Nena Polio, MD      . guaiFENesin (ROBITUSSIN) 100 MG/5ML solution 200 mg  200 mg Oral TID PRN Alexis Hugelmeyer, DO   200 mg at 05/23/16 0939  . heparin injection 5,000 Units  5,000 Units Subcutaneous Q8H Alexis Hugelmeyer, DO   5,000 Units at 05/23/16 1357  . ipratropium-albuterol (DUONEB) 0.5-2.5 (3) MG/3ML nebulizer solution 3 mL  3 mL Nebulization Q4H PRN Alexis Hugelmeyer, DO      . ipratropium-albuterol (DUONEB) 0.5-2.5 (3) MG/3ML nebulizer solution 3 mL  3 mL Nebulization Q6H PRN Alexis Hugelmeyer, DO      . [START ON 05/24/2016] levofloxacin (LEVAQUIN) tablet 500 mg  500 mg Oral Q48H Rima Vaickute, MD      . MEDLINE mouth rinse  15 mL Mouth Rinse BID Alexis Hugelmeyer, DO   15 mL at 05/22/16 1109  . metoprolol tartrate (LOPRESSOR) tablet 25 mg  25 mg Oral BID Alexis Hugelmeyer, DO   25 mg at 05/23/16 0936  . midodrine (PROAMATINE) tablet 5 mg  5 mg Oral Once per day on Mon Wed Fri Alexis Hugelmeyer, DO   5 mg at 05/22/16 1103  . mometasone-formoterol (DULERA) 200-5 MCG/ACT inhaler 2 puff  2 puff Inhalation BID Alexis Hugelmeyer, DO   2 puff at 05/23/16 4818  . multivitamin (RENA-VIT) tablet 1 tablet  1 tablet Oral QHS Alexis Hugelmeyer, DO   1 tablet at 05/22/16 2056  . ondansetron (ZOFRAN) tablet 4 mg  4 mg Oral Q6H PRN Alexis Hugelmeyer, DO       Or  . ondansetron (ZOFRAN) injection 4 mg  4 mg Intravenous Q6H PRN Alexis Hugelmeyer, DO      . oxyCODONE (Oxy IR/ROXICODONE) immediate release tablet 5 mg  5 mg Oral Q8H PRN Alexis Hugelmeyer, DO      . oxyCODONE-acetaminophen (PERCOCET) 7.5-325 MG per tablet 1 tablet  1 tablet Oral Q4H PRN Alexis Hugelmeyer, DO      . pantoprazole (PROTONIX) EC tablet 40 mg  40 mg Oral Daily Alexis Hugelmeyer, DO   40 mg at 05/23/16 0937  . polyethylene glycol (MIRALAX / GLYCOLAX) packet 17 g  17 g Oral BH-q7a Alexis Hugelmeyer, DO      . [START ON 05/24/2016] pregabalin (LYRICA) capsule 75 mg  75 mg Oral Daily Theodoro Grist, MD      .  Derrill Memo ON 05/24/2016] pregabalin (LYRICA) capsule 75 mg  75 mg Oral Once per day on Mon Wed Fri Theodoro Grist, MD      . sevelamer carbonate (RENVELA) tablet 800 mg  800 mg Oral Daily Alexis Hugelmeyer, DO   800 mg at 05/23/16 0936  . sodium chloride flush (NS) 0.9 % injection 3 mL  3 mL  Intravenous Q12H Alexis Hugelmeyer, DO   3 mL at 05/23/16 0945  . tiotropium [facility]) inhalation capsule 18 mcg  18 mcg Inhalation Daily Alexis Hugelmeyer, DO   18 mcg at 05/23/16 9432  . zolpidem (AMBIEN) tablet 5 mg  5 mg Oral QHS PRN,MR X 1 Alexis Hugelmeyer, DO         Discharge Medications: Please see discharge summary for a list of discharge medications.  Relevant Imaging Results:  Relevant Lab Results:   Additional Information    Shela Leff, LCSW

## 2016-05-23 NOTE — Progress Notes (Signed)
[facility]  ROUNDING NOTE   Subjective:   Hemodialysis treatment yesterday, emergent due to hyperkalemia. UF of 3.5 litres  Patient without complaints today.   Objective:  Vital signs in last 24 hours:  Temp:  [97 F (36.1 C)-98.4 F (36.9 C)] 98.2 F (36.8 C) (12/28 0829) Pulse Rate:  [60-73] 73 (12/28 0829) Resp:  [14-22] 19 (12/28 0829) BP: (111-132)/(67-97) 114/89 (12/28 0829) SpO2:  [95 %-100 %] 98 % (12/28 0829)  Weight change: 12.5 kg (27 lb 8.4 oz) Filed Weights   05/21/16 1951 05/22/16 0046 05/22/16 0710  Weight: 71.2 kg (157 lb) 82.7 kg (182 lb 5.1 oz) 83.7 kg (184 lb 8.4 oz)    Intake/Output: I/O last 3 completed shifts: In: 62 [P.O.:517; I.V.:3; IV Piggyback:370] Out: 6222 [Other:3500; Stool:1]   Intake/Output this shift:  Total I/O In: 120 [P.O.:120] Out: 0   Physical Exam: General: Sitting up in bed  Head: Normocephalic, atraumatic. Moist oral mucosal membranes  Eyes: Anicteric, PERRL  Neck: Supple, trachea midline  Lungs:  clear  Heart: Regular rate and rhythm  Abdomen:  Soft, nontender  Extremities: Left BKA, right metatarsal ampuation  Neurologic: Nonfocal, moving all four extremities  Skin: No lesions  Access: RIJ permcath, right arm AVF not functioning    Basic Metabolic Panel:  Recent Labs Lab 05/21/16 1954 05/22/16 0718 05/22/16 1224 05/23/16 0450  NA 134* 139  --  138  K >7.5* >7.5* 5.6* 5.9*  CL 99* 104  --  101  CO2 17* 14*  --  23  GLUCOSE 105* 80  --  83  BUN 160* 161*  --  106*  CREATININE 14.14* 15.84*  --  11.64*  CALCIUM 9.6 9.4  --  8.8*    Liver Function Tests:  Recent Labs Lab 05/21/16 1954  AST 46*  ALT 46  ALKPHOS 84  BILITOT 0.7  PROT 9.1*  ALBUMIN 3.5   No results for input(s): LIPASE, AMYLASE in the last 168 hours. No results for input(s): AMMONIA in the last 168 hours.  CBC:  Recent Labs Lab 05/21/16 1954 05/22/16 0718  WBC 8.6 8.6  NEUTROABS 5.7  --   HGB 12.8* 11.6*   HCT 38.5* 34.7*  MCV 91.3 90.7  PLT 170 149*    Cardiac Enzymes:  Recent Labs Lab 05/21/16 1954 05/22/16 0614 05/22/16 0718 05/22/16 1232  TROPONINI 0.19* 0.18* 0.18* 0.15*    BNP: Invalid input(s): POCBNP  CBG:  Recent Labs Lab 05/22/16 0026  GLUCAP 44    Microbiology: Results for orders placed or performed during the hospital encounter of 05/21/16  MRSA PCR Screening     Status: None   Collection Time: 05/22/16 12:40 AM  Result Value Ref Range Status   MRSA by PCR NEGATIVE NEGATIVE Final    Comment:        The GeneXpert MRSA Assay (FDA approved for NASAL specimens only), is one component of a comprehensive MRSA colonization surveillance program. It is not intended to diagnose MRSA infection nor to guide or monitor treatment for MRSA infections.     Coagulation Studies: No results for input(s): LABPROT, INR in the last 72 hours.  Urinalysis: No results for input(s): COLORURINE, LABSPEC, PHURINE, GLUCOSEU, HGBUR, BILIRUBINUR, KETONESUR, PROTEINUR, UROBILINOGEN, NITRITE, LEUKOCYTESUR in the last 72 hours.  Invalid input(s): APPERANCEUR    Imaging: Dg Chest 2 View  Result Date: 05/21/2016 CLINICAL DATA:  Intermittent shortness of breath for 2 days with weakness. End-stage renal disease. EXAM: CHEST  2 VIEW COMPARISON:  04/09/2016. FINDINGS: Cardiomegaly. Dialysis catheter tips SVC RA. Large RIGHT pleural effusion. BILATERAL pulmonary opacities are increased consistent with pulmonary edema. Focal opacity at the RIGHT base laterally could represent atelectasis or consolidation. Worsening aeration from priors. No pneumothorax. IMPRESSION: Interval development of pulmonary edema. Cardiomegaly with dialysis catheter, unchanged. Focal opacity at the RIGHT base, possible superimposed atelectasis or consolidation Electronically Signed   By: Staci Righter M.D.   On: 05/21/2016 20:47     Medications:    . aspirin  325 mg Oral BH-q7a  . atorvastatin  10 mg Oral  BH-q7a  . calcium acetate  667 mg Oral TID WC  . guaiFENesin  600 mg Oral BID   And  . dextromethorphan  30 mg Oral BID  . docusate sodium  100 mg Oral BID  . doxercalciferol  2.5 mcg Intravenous Q M,W,F-HD  . feeding supplement (NEPRO CARB STEADY)  237 mL Oral Daily  . ferric gluconate (FERRLECIT/NULECIT) IV  125 mg Intravenous Q M,W,F-HD  . furosemide  40 mg Intravenous Once  . heparin  5,000 Units Subcutaneous Q8H  . [START ON 05/24/2016] levofloxacin  500 mg Oral Q48H  . mouth rinse  15 mL Mouth Rinse BID  . metoprolol tartrate  25 mg Oral BID  . midodrine  5 mg Oral Once per day on Mon Wed Fri  . mometasone-formoterol  2 puff Inhalation BID  . multivitamin  1 tablet Oral QHS  . pantoprazole  40 mg Oral Daily  . polyethylene glycol  17 g Oral BH-q7a  . pregabalin  75 mg Oral BID  . sevelamer carbonate  800 mg Oral Daily  . sodium chloride flush  3 mL Intravenous Q12H  . tiotropium  18 mcg Inhalation Daily   acetaminophen, albuterol, guaiFENesin, ipratropium-albuterol, ipratropium-albuterol, ondansetron **OR** ondansetron (ZOFRAN) IV, oxyCODONE, oxyCODONE-acetaminophen, zolpidem  Assessment/ Plan:  Mr. RIGDON MACOMBER is a 60 y.o. black male with ESRD on hemodialysis, hypertension, hyperlipidemia, anemia, COPD, peripheral vascular disease, left BKA, right toe amputations  MWF [facility].   1.  End stage renal disease with severe hyperkalemia. BUN elevated - most likely due to missing dialysis.  - Emergent hemodialysis on admission 12/27.  - Resume MWF schedule but will monitor daily for dialysis need.   2.  Anemia of chronic kidney disease: hemoglobin 11.6 - EPO as outpatient.   3. Hypertension: at goal.  - restart fursoemide and metoprolol - gets midodrine before dialysis treatments.   4. Secondary Hyperparathyroidism: - calcium acetate with meals.    LOS: Gandy, Skyanne Welle 12/28/201710:14 AM

## 2016-05-23 NOTE — Progress Notes (Signed)
[place] [facility] [place] NAME: Horris Speros    MR#:  [id number]  DATE OF BIRTH:  Dec 07, 1955  SUBJECTIVE:  CHIEF COMPLAINT:   Chief Complaint  Patient presents with  . Shortness of Breath   The patient feels good today, no significant shortness of breath, although admits of cough and sputum production, now on levofloxacin orally, pro-calcitonin level was high. The patient remains afebrile and white blood cell count is normal. The patient is on room air, now with O2 sats of 96-98%. Next hemodialysis tomorrow, on Friday REVIEW OF SYSTEMS:  Review of Systems  Constitutional: Positive for malaise/fatigue. Negative for chills and fever.  HENT: Negative for congestion and sore throat.   Eyes: Negative for blurred vision and double vision.  Respiratory: Negative for cough, shortness of breath and stridor.   Cardiovascular: Negative for chest pain and leg swelling.  Gastrointestinal: Negative for abdominal pain, diarrhea, nausea and vomiting.  Musculoskeletal: Negative for joint pain.  Neurological: Positive for weakness. Negative for dizziness, focal weakness and loss of consciousness.  Psychiatric/Behavioral: Negative for depression. The patient is not nervous/anxious.     DRUG ALLERGIES:   Allergies  Allergen Reactions  . Dust Mite Extract    VITALS:  Blood pressure 119/68, pulse 60, temperature 98.2 F (36.8 C), temperature source Oral, resp. rate 19, height 6\' 2"  (1.88 m), weight 83.7 kg (184 lb 8.4 oz), SpO2 96 %. PHYSICAL EXAMINATION:  Physical Exam  Constitutional: He is oriented to person, place, and time and well-developed, well-nourished, and in no distress.  HENT:  Head: Normocephalic.  Eyes: Conjunctivae and EOM are normal.  Neck: Neck supple. No JVD present. No tracheal deviation present.  Cardiovascular: Normal rate, regular rhythm and normal heart sounds.  Exam reveals no gallop.   No murmur heard. Pulmonary/Chest: Effort  normal. No respiratory distress. He has no wheezes. He has rales in the right upper field, the right middle field, the right lower field, the left upper field, the left middle field and the left lower field.  Abdominal: Soft. Bowel sounds are normal. He exhibits no distension. There is no tenderness.  Musculoskeletal: He exhibits no edema or tenderness.  Left BKA, right toes amputation.  Neurological: He is alert and oriented to person, place, and time. No cranial nerve deficit.  Skin: No rash noted. No erythema.   LABORATORY PANEL:   CBC  Recent Labs Lab 05/22/16 0718  WBC 8.6  HGB 11.6*  HCT 34.7*  PLT 149*   ------------------------------------------------------------------------------------------------------------------ Chemistries   Recent Labs Lab 05/21/16 1954  05/23/16 0450  NA 134*  < > 138  K >7.5*  < > 5.9*  CL 99*  < > 101  CO2 17*  < > 23  GLUCOSE 105*  < > 83  BUN 160*  < > 106*  CREATININE 14.14*  < > 11.64*  CALCIUM 9.6  < > 8.8*  AST 46*  --   --   ALT 46  --   --   ALKPHOS 84  --   --   BILITOT 0.7  --   --   < > = values in this interval not displayed. RADIOLOGY:  No results found. ASSESSMENT AND PLAN:   This is a 60 y.o. male with a history of Systolic CHF EF 20, Cardiomyopathy, AI with bicuspid aortic valve, CAD with CABG and stents, COPD, chronic pulmonary hypertension, Left BKA, ESRD on dialysis now being admitted with:  1. Acute respiratory failure With  hypoxia due to Pulmonary Edema secondary to fluid overload and missed dialysis - H/O chronic pulmonary HTN -off Bipap, off O2 [place] 2L, now on room air with good O2 sats. continue Lasix, NEB. HD tomorrow, likely discharge home after dialysis tomorrow  * Hyperkalemia. K>7.5 on admission, improved with HD. Dialysis tomorrow.  2. ESRD with electrolyte abnormalities Continue Hemodialysis as per nephrology's recommendations  3. Anemia - Currently stable  4. H/O COPD with possible community  acquired pneumonia, - Proventil, Duonebs, Dulera, Spiriva, patient admitted of cough, phlegm production, appetite from shortness of breath, pro-calcitonin is high, continue levofloxacin for now, following pro-calcitonin levels  5. H/O Insulin Dependent DM - Sliding scale insulin  6. Elevated troponin, likely demand ischemia.   - Already on aspirin, statin, beta blocker.  All the records are reviewed and case discussed with Care Management/Social Worker. Management plans discussed with the patient, family and they are in agreement.  CODE STATUS: Full code  TOTAL Critical TIME TAKING CARE OF THIS PATIENT: 35 minutes.  Discussed with nursing staff, all patient's questions were answered, he voiced understanding and appreciation  POSSIBLE D/C IN 3 DAYS, DEPENDING ON CLINICAL CONDITION.   Theodoro Grist M.D on 05/23/2016 at 3:12 PM  Between 7am to 6pm - Pager - [phone]  After 6pm go to [email or web address] - Proofreader  [facility]  Office  [phone]  CC: Primary care physician; [facility]  Note: This dictation was prepared with Dragon dictation along with smaller phrase technology. Any transcriptional errors that result from this process are unintentional.

## 2016-05-23 NOTE — Care Management (Signed)
HD info faxed to [facility]

## 2016-05-23 NOTE — Clinical Social Work Note (Signed)
Clinical Social Work Assessment  Patient Details  Name: [name] MRN: [id number] Date of Birth: 1955-12-09  Date of referral:  05/23/16               Reason for consult:  Facility Placement                Permission sought to share information with:  Facility Sport and exercise psychologist, Family Supports Permission granted to share information::  Yes, Verbal Permission Granted  Name::        Agency::     Relationship::     Contact Information:     Housing/Transportation Living arrangements for the past 2 months:  [place] of Information:  Patient Patient Interpreter Needed:  None Criminal Activity/Legal Involvement Pertinent to Current Situation/Hospitalization:  No - Comment as needed Significant Relationships:  Siblings Lives with:  Facility Resident Do you feel safe going back to the place where you live?  Yes Need for family participation in patient care:  No (Coment)  Care giving concerns:  Patient resides at [facility].   Social Worker assessment / plan:  CSW spoke with patient this afternoon and patient informed CSW that he has been at [facility] for about 6 months and that he went there for short term rehab but then ended up needing to stay. Patient is very animated when he speaks of being at [facility] and is not too impressed with their ability to administer medications on time. Patient states that even though he has concerns, he wishes to return to [facility] at discharge. Patient's sister was in the room visiting with patient and she stated she would transport him.   Employment status:  Disabled (Comment on whether or not currently receiving Disability) Insurance information:  Medicare PT Recommendations:    Information / Referral to community resources:     Patient/Family's Response to care:  Patient expressed appreciation for CSW visit.   Patient/Family's Understanding of and Emotional Response to  Diagnosis, Current Treatment, and Prognosis:  Patient with good insight into his current condition and verbalizes understanding of situation well.  Emotional Assessment Appearance:  Appears stated age Attitude/Demeanor/Rapport:   (pleasant and cooperative) Affect (typically observed):  Happy Orientation:  Oriented to Self, Oriented to Place, Oriented to  Time, Oriented to Situation Alcohol / Substance use:  Not Applicable Psych involvement (Current and /or in the community):  No (Comment)  Discharge Needs  Concerns to be addressed:  Care Coordination Readmission within the last 30 days:  No Current discharge risk:  None Barriers to Discharge:  No Barriers Identified   Shela Leff, LCSW 05/23/2016, 3:40 PM

## 2016-05-23 NOTE — Progress Notes (Signed)
PT Cancellation Note  Patient Details Name: [name] MRN: [id number] DOB: March 29, 1956   Cancelled Treatment:    Reason Eval/Treat Not Completed: Medical issues which prohibited therapy.  Pt's potassium noted to have increased from last noted labs of 5.6 yesterday to 5.9 today.  Per PT guidelines for elevated potassium, will hold PT at this time and re-attempt PT eval at a later date/time as medically appropriate.   Leitha Bleak 05/23/2016, 8:58 AM Leitha Bleak, [place]

## 2016-05-24 DIAGNOSIS — R778 Other specified abnormalities of plasma proteins: Secondary | ICD-10-CM

## 2016-05-24 DIAGNOSIS — J9601 Acute respiratory failure with hypoxia: Secondary | ICD-10-CM

## 2016-05-24 DIAGNOSIS — J189 Pneumonia, unspecified organism: Secondary | ICD-10-CM

## 2016-05-24 DIAGNOSIS — R7989 Other specified abnormal findings of blood chemistry: Secondary | ICD-10-CM

## 2016-05-24 DIAGNOSIS — J181 Lobar pneumonia, unspecified organism: Secondary | ICD-10-CM

## 2016-05-24 LAB — CBC
HCT: 31.1 % — ABNORMAL LOW (ref 40.0–52.0)
Hemoglobin: 10.6 g/dL — ABNORMAL LOW (ref 13.0–18.0)
MCH: 30.7 pg (ref 26.0–34.0)
MCHC: 33.9 g/dL (ref 32.0–36.0)
MCV: 90.5 fL (ref 80.0–100.0)
PLATELETS: 121 10*3/uL — AB (ref 150–440)
RBC: 3.43 MIL/uL — ABNORMAL LOW (ref 4.40–5.90)
RDW: 15.6 % — ABNORMAL HIGH (ref 11.5–14.5)
WBC: 7.2 10*3/uL (ref 3.8–10.6)

## 2016-05-24 LAB — POTASSIUM: POTASSIUM: 5.6 mmol/L — AB (ref 3.5–5.1)

## 2016-05-24 MED ORDER — LEVOFLOXACIN 500 MG PO TABS: 500.0000 mg | ORAL_TABLET | ORAL | 0 refills | Status: DC

## 2016-05-24 MED ORDER — OXYCODONE HCL 5 MG PO TABA: 1.0000 | ORAL_TABLET | Freq: Three times a day (TID) | ORAL | 0 refills | Status: DC | PRN

## 2016-05-24 MED ORDER — GUAIFENESIN ER 600 MG PO TB12: 600.0000 mg | ORAL_TABLET | Freq: Two times a day (BID) | ORAL | 0 refills | Status: DC

## 2016-05-24 MED ORDER — DEXTROMETHORPHAN POLISTIREX ER 30 MG/5ML PO SUER: 30.0000 mg | Freq: Two times a day (BID) | ORAL | 0 refills | Status: DC

## 2016-05-24 MED ORDER — OXYCODONE-ACETAMINOPHEN 7.5-325 MG PO TABS: 1.0000 | ORAL_TABLET | ORAL | 0 refills | Status: DC | PRN

## 2016-05-24 MED ORDER — LEVOFLOXACIN 500 MG PO TABS
500.0000 mg | ORAL_TABLET | ORAL | Status: DC
  Filled 2016-05-24: qty 1

## 2016-05-24 NOTE — Progress Notes (Signed)
PT Cancellation Note  Patient Details Name: [name] MRN: [id number] DOB: 1956/02/27   Cancelled Treatment:    Reason Eval/Treat Not Completed: Medical issues which prohibited therapy;Patient at procedure or test/unavailable.  Pt's potassium noted to still be elevated today at 5.6.  Nursing reporting pt currently off floor at dialysis and has been transferring to bedside commode without assist (pt has prosthesis but per nursing pt reported it was not at hospital).  Will hold PT at this time d/t elevated potassium (per PT guidelines) and will re-attempt PT eval at a later date/time as medically appropriate.   [name] 05/24/2016, 11:17 AM Leitha Bleak, [place]

## 2016-05-24 NOTE — Progress Notes (Signed)
[facility]  ROUNDING NOTE   Subjective:   Seen and examined on hemodialysis. Tolerating treatment well. UF goal of 3 litres.   Patient complains of shortness of breath prior to treatment.   Objective:  Vital signs in last 24 hours:  Temp:  [97.6 F (36.4 C)-98.2 F (36.8 C)] 97.9 F (36.6 C) (12/29 0950) Pulse Rate:  [59-69] 65 (12/29 1100) Resp:  [14-20] 18 (12/29 1100) BP: (119-155)/(68-109) 143/98 (12/29 1100) SpO2:  [90 %-100 %] 100 % (12/29 1100) Weight:  [80.3 kg (177 lb 0.5 oz)] 80.3 kg (177 lb 0.5 oz) (12/29 0950)  Weight change:  Filed Weights   05/22/16 0046 05/22/16 0710 05/24/16 0950  Weight: 82.7 kg (182 lb 5.1 oz) 83.7 kg (184 lb 8.4 oz) 80.3 kg (177 lb 0.5 oz)    Intake/Output: I/O last 3 completed shifts: In: 366 [P.O.:360; I.V.:6] Out: 0    Intake/Output this shift:  Total I/O In: 240 [P.O.:240] Out: -   Physical Exam: General: Sitting up in bed  Head: Normocephalic, atraumatic. Moist oral mucosal membranes  Eyes: Anicteric, PERRL  Neck: Supple, trachea midline  Lungs:  Bilateral crackles  Heart: Regular rate and rhythm  Abdomen:  Soft, nontender  Extremities: Left BKA, right metatarsal ampuation  Neurologic: Nonfocal, moving all four extremities  Skin: No lesions  Access: RIJ permcath, right arm AVF not functioning    Basic Metabolic Panel:  Recent Labs Lab 05/21/16 1954 05/22/16 0718 05/22/16 1224 05/23/16 0450 05/24/16 0452  NA 134* 139  --  138  --   K >7.5* >7.5* 5.6* 5.9* 5.6*  CL 99* 104  --  101  --   CO2 17* 14*  --  23  --   GLUCOSE 105* 80  --  83  --   BUN 160* 161*  --  106*  --   CREATININE 14.14* 15.84*  --  11.64*  --   CALCIUM 9.6 9.4  --  8.8*  --     Liver Function Tests:  Recent Labs Lab 05/21/16 1954  AST 46*  ALT 46  ALKPHOS 84  BILITOT 0.7  PROT 9.1*  ALBUMIN 3.5   No results for input(s): LIPASE, AMYLASE in the last 168 hours. No results for input(s): AMMONIA in the last 168  hours.  CBC:  Recent Labs Lab 05/21/16 1954 05/22/16 0718 05/24/16 0452  WBC 8.6 8.6 7.2  NEUTROABS 5.7  --   --   HGB 12.8* 11.6* 10.6*  HCT 38.5* 34.7* 31.1*  MCV 91.3 90.7 90.5  PLT 170 149* 121*    Cardiac Enzymes:  Recent Labs Lab 05/21/16 1954 05/22/16 0614 05/22/16 0718 05/22/16 1232  TROPONINI 0.19* 0.18* 0.18* 0.15*    BNP: Invalid input(s): POCBNP  CBG:  Recent Labs Lab 05/22/16 0026  GLUCAP 21    Microbiology: Results for orders placed or performed during the hospital encounter of 05/21/16  MRSA PCR Screening     Status: None   Collection Time: 05/22/16 12:40 AM  Result Value Ref Range Status   MRSA by PCR NEGATIVE NEGATIVE Final    Comment:        The GeneXpert MRSA Assay (FDA approved for NASAL specimens only), is one component of a comprehensive MRSA colonization surveillance program. It is not intended to diagnose MRSA infection nor to guide or monitor treatment for MRSA infections.     Coagulation Studies: No results for input(s): LABPROT, INR in the last 72 hours.  Urinalysis: No results for input(s): COLORURINE,  LABSPEC, PHURINE, GLUCOSEU, HGBUR, BILIRUBINUR, KETONESUR, PROTEINUR, UROBILINOGEN, NITRITE, LEUKOCYTESUR in the last 72 hours.  Invalid input(s): APPERANCEUR    Imaging: No results found.   Medications:    . aspirin  325 mg Oral BH-q7a  . atorvastatin  10 mg Oral BH-q7a  . calcium acetate  667 mg Oral TID WC  . guaiFENesin  600 mg Oral BID   And  . dextromethorphan  30 mg Oral BID  . docusate sodium  100 mg Oral BID  . doxercalciferol  2.5 mcg Intravenous Q M,W,F-HD  . feeding supplement (NEPRO CARB STEADY)  237 mL Oral Daily  . ferric gluconate (FERRLECIT/NULECIT) IV  125 mg Intravenous Q M,W,F-HD  . furosemide  40 mg Intravenous Once  . heparin  5,000 Units Subcutaneous Q8H  . levofloxacin  500 mg Oral Q48H  . mouth rinse  15 mL Mouth Rinse BID  . metoprolol tartrate  25 mg Oral BID  . midodrine  5  mg Oral Once per day on Mon Wed Fri  . mometasone-formoterol  2 puff Inhalation BID  . multivitamin  1 tablet Oral QHS  . pantoprazole  40 mg Oral Daily  . polyethylene glycol  17 g Oral BH-q7a  . pregabalin  75 mg Oral Daily  . pregabalin  75 mg Oral Once per day on Mon Wed Fri  . sevelamer carbonate  800 mg Oral Daily  . sodium chloride flush  3 mL Intravenous Q12H  . tiotropium  18 mcg Inhalation Daily   acetaminophen, albuterol, guaiFENesin, ipratropium-albuterol, ipratropium-albuterol, ondansetron **OR** ondansetron (ZOFRAN) IV, oxyCODONE, oxyCODONE-acetaminophen, zolpidem  Assessment/ Plan:  [name] is a 60 y.o. black male with ESRD on hemodialysis, hypertension, hyperlipidemia, anemia, COPD, peripheral vascular disease, left BKA, right toe amputations  MWF [facility] Nephrology [facility].   1.  End stage renal disease with severe hyperkalemia. BUN elevated - most likely due to missing dialysis.  - Emergent hemodialysis on admission 12/27.  - Resume MWF schedule : seen and examined on treatment today.  Monitor daily for dialysis treamtment  2.  Anemia of chronic kidney disease: hemoglobin 10.6 - EPO as outpatient.   3. Hypertension: elevated diastolic during treatment. Held midodrine before treatment.   - fursoemide and metoprolol  4. Secondary Hyperparathyroidism: - calcium acetate with meals.    LOS: [name], [name] 12/29/201711:27 AM

## 2016-05-24 NOTE — Progress Notes (Signed)
Pre Dialysis 

## 2016-05-24 NOTE — Progress Notes (Signed)
Dialysis started 

## 2016-05-24 NOTE — Progress Notes (Signed)
Dialysis complete

## 2016-05-24 NOTE — Clinical Social Work Note (Signed)
Physician to discharge patient back to [facility] today. Discharge information sent and Doug at [facility] is aware. Family to transport.  Shela Leff MSW,LCSW [phone]

## 2016-05-24 NOTE — Progress Notes (Signed)
Pt with EF <20, however pt with hyperkalemia therefore ACE/ARB CI  Melissa D Maccia, Pharm.D, BCPS Clinical Pharmacist

## 2016-05-24 NOTE — Care Management Important Message (Signed)
Important Message  Patient Details  Name: [name] MRN: [id number] Date of Birth: 06-07-1955   Medicare Important Message Given:  Yes    Beverly Sessions, RN 05/24/2016, 3:09 PM

## 2016-05-24 NOTE — Discharge Summary (Signed)
[place] [facility] [place] NAME: [name]    MR#:  [id number]  DATE OF BIRTH:  10/23/1955  DATE OF ADMISSION:  05/21/2016 ADMITTING PHYSICIAN: Joel Glassing Hugelmeyer, DO  DATE OF DISCHARGE: No discharge date for patient encounter.  PRIMARY CARE PHYSICIAN: [name]     ADMISSION DIAGNOSIS:  Hyperkalemia [E87.5] Acute pulmonary edema (HCC) [J81.0] SOB (shortness of breath) [R06.02] Elevated troponin [R74.8] Acute on chronic congestive heart failure, unspecified congestive heart failure type ([place]) [I50.9]  DISCHARGE DIAGNOSIS:  Principal Problem:   Acute respiratory failure with hypoxia (HCC) Active Problems:   Pulmonary edema   Right lower lobe pneumonia (HCC)   Elevated troponin   SECONDARY DIAGNOSIS:   Past Medical History:  Diagnosis Date  . Anemia   . Asthma   . CHF (congestive heart failure) ([place])   . Chronic systolic heart failure ([place])   . Complication of anesthesia    hypotension  . COPD (chronic obstructive pulmonary disease) ([place])   . Coronary artery disease   . Dialysis patient ([place])    Mon, Wed, Fri  . End stage renal disease ([place])   . GERD (gastroesophageal reflux disease)   . Headache   . History of kidney stones   . HLD (hyperlipidemia)   . HTN (hypertension)   . Hyperparathyroidism   . Myocardial infarction   . Peripheral vascular disease ([place])   . Shortness of breath dyspnea   . Sleep apnea   . Tobacco dependence     .pro HOSPITAL COURSE:   The patient is 60 year old African-American male with past medical history significant for history of end-stage renal disease, on hemodialysis, cardiomyopathy with ejection fraction of 20%, coronary artery disease, COPD, who presents to the hospital with complaints of shortness of breath, which started about 2 days ago. He also admitted of weakness, fatigue, nonproductive cough. Chest x-ray in emergency room revealed cardiomegaly, pulmonary edema,  right base opacity concerning for pneumonia. Patient was admitted to the hospital for further evaluation and treatment, nephrology consultation was requested, and he underwent few hemodialysis sessions while in the hospital with improvement of his oxygenation and well-being. He was also treated for possible pneumonia with levofloxacin, he did not have any sputum production. He was advised to continue antibiotic therapy for a few more days to complete course. He was advised to follow-up with his primary care physician and nephrologist and continue dialysis per schedule. Discussion by problem: 1. Acute respiratory failure with hypoxia due to Pulmonary Edema secondary to fluid overload and missed dialysis - H/O chronic pulmonary HTN -off Bipap, off O2 , now on room air with good O2 sats, 100% on room air today. The patient was managed on Lasix, NEB, antibiotics while in the hospital. He underwent few HD sessions with improvement of his oxygenation and well-being, patient was advised to continue hemodialysis per schedule.  * Hyperkalemia. K>7.5 on admission, resolved with HD. Follow as outpatient  2. ESRD with electrolyte abnormalities Continue Hemodialysis as per nephrology's recommendations, per schedule  3. Anemia - Currently stable, no bleeding noted  4. H/O COPD with possible right lower lobe pneumonia, no expectorations noted. No sputum production, patient is to continue antibiotics for 3 more doses, continue Proventil, Duonebs, Dulera, Spiriva. Pro-calcitonin level was high  5. H/O Insulin Dependent DM - Sliding scale insulin  6. Elevated troponin, due to demand ischemia.  - Already on aspirin, statin, beta blocker  DISCHARGE CONDITIONS:   Stable  CONSULTS OBTAINED:  Treatment  Team:  Joel Dana, MD  DRUG ALLERGIES:   Allergies  Allergen Reactions  . Dust Mite Extract     DISCHARGE MEDICATIONS:   Current Discharge Medication List    START taking these medications    Details  dextromethorphan (DELSYM) 30 MG/5ML liquid Take 5 mLs (30 mg total) by mouth 2 (two) times daily. Qty: 89 mL, Refills: 0    guaiFENesin (MUCINEX) 600 MG 12 hr tablet Take 1 tablet (600 mg total) by mouth 2 (two) times daily. Qty: 20 tablet, Refills: 0    levofloxacin (LEVAQUIN) 500 MG tablet Take 1 tablet (500 mg total) by mouth every other day. Qty: 3 tablet, Refills: 0      CONTINUE these medications which have CHANGED   Details  OxyCODONE HCl, Abuse Deter, (OXAYDO) 5 MG TABA Take 1 tablet by mouth every 8 (eight) hours as needed. Qty: 30 tablet, Refills: 0    oxyCODONE-acetaminophen (PERCOCET) 7.5-325 MG tablet Take 1 tablet by mouth every 4 (four) hours as needed for severe pain. Qty: 30 tablet, Refills: 0      CONTINUE these medications which have NOT CHANGED   Details  acetaminophen (TYLENOL) 500 MG tablet Take 1,000 mg by mouth every 6 (six) hours as needed for moderate pain.    albuterol (PROVENTIL HFA;VENTOLIN HFA) 108 (90 BASE) MCG/ACT inhaler Inhale 2 puffs into the lungs every 6 (six) hours as needed for wheezing or shortness of breath.     aspirin 325 MG tablet Take 325 mg by mouth every morning.     atorvastatin (LIPITOR) 10 MG tablet Take 10 mg by mouth every morning.     budesonide-formoterol (SYMBICORT) 160-4.5 MCG/ACT inhaler Inhale 2 puffs into the lungs 2 (two) times daily. Reported on 08/14/2015    calcium acetate (PHOSLO) 667 MG capsule Take 667 mg by mouth 3 (three) times daily with meals.    docusate sodium (COLACE) 100 MG capsule Take 100 mg by mouth 2 (two) times daily. Reported on 08/14/2015    guaiFENesin (ROBITUSSIN) 100 MG/5ML liquid Take 200 mg by mouth 3 (three) times daily as needed for cough.    ipratropium-albuterol (DUONEB) 0.5-2.5 (3) MG/3ML SOLN Take 3 mLs by nebulization every 4 (four) hours as needed (SOB).    KIONEX 15 GM/60ML suspension Take 60 mLs by mouth once.    metoprolol tartrate (LOPRESSOR) 25 MG tablet Take 25 mg by  mouth 2 (two) times daily.      multivitamin (RENA-VIT) TABS tablet Take 1 tablet by mouth at bedtime. Refills: 0    Nutritional Supplements (FEEDING SUPPLEMENT, NEPRO CARB STEADY,) LIQD Take 237 mLs by mouth daily.    polyethylene glycol (MIRALAX / GLYCOLAX) packet Take 17 g by mouth every morning. Hold for loose stools.    pregabalin (LYRICA) 75 MG capsule Take 75 mg by mouth 2 (two) times daily.    sevelamer carbonate (RENVELA) 800 MG tablet Take 800 mg by mouth daily.     tiotropium (SPIRIVA) 18 MCG inhalation capsule Place 18 mcg into inhaler and inhale daily.    Darbepoetin Alfa (ARANESP) 40 MCG/0.4ML SOSY injection Inject 0.4 mLs (40 mcg total) into the vein every Monday with hemodialysis. Qty: 8.4 mL    doxercalciferol (HECTOROL) 4 MCG/2ML injection Inject 1.25 mLs (2.5 mcg total) into the vein every Monday, Wednesday, and Friday with hemodialysis. Qty: 2 mL    ferric gluconate 125 mg in sodium chloride 0.9 % 100 mL Inject 125 mg into the vein every Monday, Wednesday, and Friday with hemodialysis.  midodrine (PROAMATINE) 5 MG tablet Take 5 mg by mouth. 3 times a week, 30 minutes before dialysis    pantoprazole (PROTONIX) 20 MG tablet Take 20 mg by mouth daily.    triamcinolone cream (KENALOG) 0.1 % Apply 1 application topically daily as needed.      STOP taking these medications     moxifloxacin (AVELOX) 400 MG tablet      simvastatin (ZOCOR) 10 MG tablet          DISCHARGE INSTRUCTIONS:    Patient is to follow-up with primary care physician, primary nephrology for hemodialysis  If you experience worsening of your admission symptoms, develop shortness of breath, life threatening emergency, suicidal or homicidal thoughts you must seek medical attention immediately by calling 911 or calling your MD immediately  if symptoms less severe.  You Must read complete instructions/literature along with all the possible adverse reactions/side effects for all the Medicines  you take and that have been prescribed to you. Take any new Medicines after you have completely understood and accept all the possible adverse reactions/side effects.   Please note  You were cared for by a hospitalist during your hospital stay. If you have any questions about your discharge medications or the care you received while you were in the hospital after you are discharged, you can call the unit and asked to speak with the hospitalist on call if the hospitalist that took care of you is not available. Once you are discharged, your primary care physician will handle any further medical issues. Please note that NO REFILLS for any discharge medications will be authorized once you are discharged, as it is imperative that you return to your primary care physician (or establish a relationship with a primary care physician if you do not have one) for your aftercare needs so that they can reassess your need for medications and monitor your lab values.    Today   CHIEF COMPLAINT:   Chief Complaint  Patient presents with  . Shortness of Breath    HISTORY OF PRESENT ILLNESS:  [name]  is a 60 y.o. male with a known history of end-stage renal disease, on hemodialysis, cardiomyopathy with ejection fraction of 20%, coronary artery disease, COPD, who presents to the hospital with complaints of shortness of breath, which started about 2 days ago. He also admitted of weakness, fatigue, nonproductive cough. Chest x-ray in emergency room revealed cardiomegaly, pulmonary edema, right base opacity concerning for pneumonia. Patient was admitted to the hospital for further evaluation and treatment, nephrology consultation was requested, and he underwent few hemodialysis sessions while in the hospital with improvement of his oxygenation and well-being. He was also treated for possible pneumonia with levofloxacin, he did not have any sputum production. He was advised to continue antibiotic therapy for a few  more days to complete course. He was advised to follow-up with his primary care physician and nephrologist and continue dialysis per schedule. Discussion by problem: 1. Acute respiratory failure with hypoxia due to Pulmonary Edema secondary to fluid overload and missed dialysis - H/O chronic pulmonary HTN -off Bipap, off O2 , now on room air with good O2 sats, 100% on room air today. The patient was managed on Lasix, NEB, antibiotics while in the hospital. He underwent few HD sessions with improvement of his oxygenation and well-being, patient was advised to continue hemodialysis per schedule.  * Hyperkalemia. K>7.5 on admission, resolved with HD. Follow as outpatient  2. ESRD with electrolyte abnormalities Continue Hemodialysis as per  nephrology's recommendations, per schedule  3. Anemia - Currently stable, no bleeding noted  4. H/O COPD with possible right lower lobe pneumonia, no expectorations noted. No sputum production, patient is to continue antibiotics for 3 more doses, continue Proventil, Duonebs, Dulera, Spiriva. Pro-calcitonin level was high  5. H/O Insulin Dependent DM - Sliding scale insulin  6. Elevated troponin, due to demand ischemia.  - Already on aspirin, statin, beta blocker    VITAL SIGNS:  Blood pressure 113/77, pulse (!) 58, temperature 97.9 F (36.6 C), temperature source Oral, resp. rate 16, height 6\' 2"  (1.88 m), weight 76.8 kg (169 lb 5 oz), SpO2 100 %.  I/O:   Intake/Output Summary (Last 24 hours) at 05/24/16 1428 Last data filed at 05/24/16 1338  Gross per 24 hour  Intake              423 ml  Output             3584 ml  Net            -3161 ml    PHYSICAL EXAMINATION:  GENERAL:  60 y.o.-year-old patient lying in the bed with no acute distress.  EYES: Pupils equal, round, reactive to light and accommodation. No scleral icterus. Extraocular muscles intact.  HEENT: Head atraumatic, normocephalic. Oropharynx and nasopharynx clear.  NECK:   Supple, no jugular venous distention. No thyroid enlargement, no tenderness.  LUNGS: Normal breath sounds bilaterally, no wheezing, rales,rhonchi , , Few crepitations at the right base . No use of accessory muscles of respiration.  CARDIOVASCULAR: S1, S2 normal. No murmurs, rubs, or gallops.  ABDOMEN: Soft, non-tender, non-distended. Bowel sounds present. No organomegaly or mass.  EXTREMITIES: No pedal edema, cyanosis, or clubbing.  NEUROLOGIC: Cranial nerves II through XII are intact. Muscle strength 5/5 in all extremities. Sensation intact. Gait not checked.  PSYCHIATRIC: The patient is alert and oriented x 3.  SKIN: No obvious rash, lesion, or ulcer.   DATA REVIEW:   CBC  Recent Labs Lab 05/24/16 0452  WBC 7.2  HGB 10.6*  HCT 31.1*  PLT 121*    Chemistries   Recent Labs Lab 05/21/16 1954  05/23/16 0450 05/24/16 0452  NA 134*  < > 138  --   K >7.5*  < > 5.9* 5.6*  CL 99*  < > 101  --   CO2 17*  < > 23  --   GLUCOSE 105*  < > 83  --   BUN 160*  < > 106*  --   CREATININE 14.14*  < > 11.64*  --   CALCIUM 9.6  < > 8.8*  --   AST 46*  --   --   --   ALT 46  --   --   --   ALKPHOS 84  --   --   --   BILITOT 0.7  --   --   --   < > = values in this interval not displayed.  Cardiac Enzymes  Recent Labs Lab 05/22/16 1232  TROPONINI 0.15*    Microbiology Results  Results for orders placed or performed during the hospital encounter of 05/21/16  MRSA PCR Screening     Status: None   Collection Time: 05/22/16 12:40 AM  Result Value Ref Range Status   MRSA by PCR NEGATIVE NEGATIVE Final    Comment:        The GeneXpert MRSA Assay (FDA approved for NASAL specimens only), is one component of a comprehensive MRSA colonization  surveillance program. It is not intended to diagnose MRSA infection nor to guide or monitor treatment for MRSA infections.     RADIOLOGY:  No results found.  EKG:   Orders placed or performed during the hospital encounter of 05/21/16   . EKG 12-Lead  . EKG 12-Lead  . ED EKG  . ED EKG  . EKG 12-Lead      Management plans discussed with the patient, family and they are in agreement.  CODE STATUS:     Code Status Orders        Start     Ordered   05/22/16 0035  Full code  Continuous     05/22/16 0034    Code Status History    Date Active Date Inactive Code Status Order ID Comments User Context   01/13/2015  1:44 AM 01/14/2015  2:46 PM Full Code [id number]  Juluis Mire, MD ED   12/15/2014  9:32 AM 12/15/2014  2:31 PM Full Code [id number]  Algernon Huxley, MD Inpatient   05/06/2014  8:29 PM 05/10/2014  5:23 PM Full Code [id number]  Elam Dutch, MD Inpatient   05/04/2014 10:24 PM 05/06/2014  8:29 PM Full Code [id number]  Rise Patience, MD Inpatient      TOTAL TIME TAKING CARE OF THIS PATIENT: 40  minutes.    Theodoro Grist M.D on 05/24/2016 at 2:28 PM  Between 7am to 6pm - Pager - [phone]  After 6pm go to [email or web address] - password [email or web address] [email or web address]  [facility]  Office  [phone]  CC: Primary care physician; [place]

## 2016-05-24 NOTE — Progress Notes (Signed)
05/24/2016 3:05 PM  BP 113/77   Pulse (!) 58   Temp 97.9 F (36.6 C) (Oral)   Resp 16   Ht 6\' 2"  (1.88 m)   Wt 76.8 kg (169 lb 5 oz)   SpO2 100%   BMI 21.74 kg/m  Patient discharged to [facility] per MD orders. Discharge instructions reviewed with patient and patient verbalized understanding. Report called to [facility] at [place].  IV's removed per policy. Prescriptions discussed and given to patient. Discharged via wheelchair escorted by nursing staff.  Almedia Balls, RN

## 2016-05-24 NOTE — Care Management Important Message (Signed)
Important Message  Patient Details  Name: [name] MRN: [id number] Date of Birth: 15-Mar-1956   Medicare Important Message Given:  Yes    Beverly Sessions, RN 05/24/2016, 3:09 PM

## 2016-05-24 NOTE — Progress Notes (Signed)
Post dialysis 

## 2016-06-14 ENCOUNTER — Ambulatory Visit (INDEPENDENT_AMBULATORY_CARE_PROVIDER_SITE_OTHER): Payer: Medicare Other

## 2016-06-14 ENCOUNTER — Ambulatory Visit (INDEPENDENT_AMBULATORY_CARE_PROVIDER_SITE_OTHER): Payer: Medicare Other | Admitting: Vascular Surgery

## 2016-06-14 ENCOUNTER — Encounter (INDEPENDENT_AMBULATORY_CARE_PROVIDER_SITE_OTHER): Payer: Self-pay | Admitting: Vascular Surgery

## 2016-06-14 ENCOUNTER — Other Ambulatory Visit (INDEPENDENT_AMBULATORY_CARE_PROVIDER_SITE_OTHER): Payer: Self-pay | Admitting: Vascular Surgery

## 2016-06-14 VITALS — BP 110/72 | HR 73 | Resp 16 | Ht 74.5 in | Wt 170.0 lb

## 2016-06-14 DIAGNOSIS — Z992 Dependence on renal dialysis: Secondary | ICD-10-CM

## 2016-06-14 DIAGNOSIS — T829XXA Unspecified complication of cardiac and vascular prosthetic device, implant and graft, initial encounter: Secondary | ICD-10-CM

## 2016-06-14 DIAGNOSIS — I739 Peripheral vascular disease, unspecified: Secondary | ICD-10-CM

## 2016-06-14 DIAGNOSIS — N186 End stage renal disease: Secondary | ICD-10-CM

## 2016-06-14 DIAGNOSIS — E785 Hyperlipidemia, unspecified: Secondary | ICD-10-CM | POA: Diagnosis not present

## 2016-06-14 NOTE — Progress Notes (Signed)
Subjective:    Patient ID: [name], male    DOB: 07/14/1955, 61 y.o.   MRN: [id number] Chief Complaint  Patient presents with  . Re-evaluation    Ultrasound follow up   Patient presents to review vascular studies. He underwent a left upper extremity vein mapping which was amenable to creation of a left brachio-basilic AV fistula creation. He is currently being maintained by a right IJ permcath without any issue.    Review of Systems  Constitutional: Negative.   HENT: Negative.   Eyes: Negative.   Respiratory: Negative.   Cardiovascular: Negative.   Gastrointestinal: Negative.   Endocrine: Negative.   Genitourinary:       ESRD  Musculoskeletal: Negative.   Skin: Negative.   Allergic/Immunologic: Negative.   Neurological: Negative.   Hematological: Negative.   Psychiatric/Behavioral: Negative.        Objective:   Physical Exam  Constitutional: He is oriented to person, place, and time. He appears well-developed and well-nourished.  HENT:  Head: Normocephalic and atraumatic.  Right Ear: External ear normal.  Left Ear: External ear normal.  Eyes: Conjunctivae and EOM are normal. Pupils are equal, round, and reactive to light.  Neck: Normal range of motion.  Cardiovascular: Normal rate, regular rhythm, normal heart sounds and intact distal pulses.   Pulses:      Radial pulses are 2+ on the right side, and 2+ on the left side.  Right IJ Permcath: Intact. No signs of infection.   Pulmonary/Chest: Effort normal and breath sounds normal.  Abdominal: Soft. Bowel sounds are normal.  Musculoskeletal: Normal range of motion. He exhibits no edema.  Neurological: He is alert and oriented to person, place, and time.  Skin: Skin is warm and dry.  Psychiatric: He has a normal mood and affect. His behavior is normal. Judgment and thought content normal.   BP 110/72 (BP Location: Left Arm)   Pulse 73   Resp 16   Ht 6' 2.5" (1.892 m)   Wt 170 lb (77.1 kg)   BMI 21.53 kg/m    Past Medical History:  Diagnosis Date  . Anemia   . Asthma   . CHF (congestive heart failure) ([place])   . Chronic systolic heart failure ([place])   . Complication of anesthesia    hypotension  . COPD (chronic obstructive pulmonary disease) ([place])   . Coronary artery disease   . Dialysis patient ([place])    Mon, Wed, Fri  . End stage renal disease ([place])   . GERD (gastroesophageal reflux disease)   . Headache   . History of kidney stones   . HLD (hyperlipidemia)   . HTN (hypertension)   . Hyperparathyroidism   . Myocardial infarction   . Peripheral vascular disease ([place])   . Shortness of breath dyspnea   . Sleep apnea   . Tobacco dependence     Social History   Social History  . Marital status: Widowed    Spouse name: N/A  . Number of children: N/A  . Years of education: N/A   Occupational History  . Not on file.   Social History Main Topics  . Smoking status: Light Tobacco Smoker    Packs/day: 0.25    Types: Cigarettes  . Smokeless tobacco: Never Used     Comment: 5  . Alcohol use No  . Drug use: No     Comment: has used crack cocaine in past   . Sexual activity: Not Currently    Birth control/ protection:  Abstinence   Other Topics Concern  . Not on file   Social History Narrative   Engaged.     Past Surgical History:  Procedure Laterality Date  . AMPUTATION Left 05/06/2014   Procedure: AMPUTATION BELOW KNEE;  Surgeon: Elam Dutch, MD;  Location: [place];  Service: Vascular;  Laterality: Left;  . AMPUTATION Right 01/12/2015   Procedure: Foot transmetatarsal amputation;  Surgeon: Algernon Huxley, MD;  Location: [facility];  Service: Vascular;  Laterality: Right;  . APPLICATION OF WOUND VAC Right 03/01/2015   Procedure: Application of Bio-connekt graft and wound vac application to right foot ;  Surgeon: Algernon Huxley, MD;  Location: [facility];  Service: Vascular;  Laterality: Right;  . AV FISTULA PLACEMENT Left   . CARDIAC CATHETERIZATION     stent placement   .  CORONARY ANGIOPLASTY    . LIGATION OF ARTERIOVENOUS  FISTULA Right 01/31/2016   Procedure: LIGATION OF ARTERIOVENOUS  FISTULA;  Surgeon: Algernon Huxley, MD;  Location: [facility];  Service: Vascular;  Laterality: Right;  . PERIPHERAL VASCULAR CATHETERIZATION Right 12/15/2014   Procedure: Lower Extremity Angiography;  Surgeon: Algernon Huxley, MD;  Location: [place] [facility];  Service: Cardiovascular;  Laterality: Right;  . PERIPHERAL VASCULAR CATHETERIZATION  12/15/2014   Procedure: Lower Extremity Intervention;  Surgeon: Algernon Huxley, MD;  Location: [place] [facility];  Service: Cardiovascular;;  . PERIPHERAL VASCULAR CATHETERIZATION Right 08/14/2015   Procedure: A/V Shuntogram/Fistulagram;  Surgeon: Algernon Huxley, MD;  Location: [place] [facility];  Service: Cardiovascular;  Laterality: Right;  . PERIPHERAL VASCULAR CATHETERIZATION N/A 08/14/2015   Procedure: A/V Shunt Intervention;  Surgeon: Algernon Huxley, MD;  Location: [place] [facility];  Service: Cardiovascular;  Laterality: N/A;  . PERIPHERAL VASCULAR CATHETERIZATION N/A 01/11/2016   Procedure: Dialysis/Perma Catheter Insertion;  Surgeon: Algernon Huxley, MD;  Location: [place] [facility];  Service: Cardiovascular;  Laterality: N/A;  . REVISON OF ARTERIOVENOUS FISTULA Right 02/17/2016   Procedure: removal of AV fistula;  Surgeon: Serafina Mitchell, MD;  Location: [facility];  Service: Vascular;  Laterality: Right;  . REVISON OF ARTERIOVENOUS FISTULA Right 01/31/2016   Procedure: REVISON OF ARTERIOVENOUS FISTULA ( BRACHIOCEPHALIC ) W/ ARTEGRAFT;  Surgeon: Algernon Huxley, MD;  Location: [facility];  Service: Vascular;  Laterality: Right;  . TRANSMETATARSAL AMPUTATION Right 05/04/2015   Procedure: TRANSMETATARSAL AMPUTATION REVISION, great toe amputation;  Surgeon: Algernon Huxley, MD;  Location: [facility];  Service: Vascular;  Laterality: Right;    Family History  Problem Relation Age of Onset  . Heart failure Other   . Hypertension Other   . Leukemia Other   .  Diabetes Other     Allergies  Allergen Reactions  . Dust Mite Extract        Assessment & Plan:  Patient presents to review vascular studies. He underwent a left upper extremity vein mapping which was amenable to creation of a left brachio-basilic AV fistula creation. He is currently being maintained by a right IJ permcath without any issue.   1. ESRD (end stage renal disease) on dialysis ([place]) - Stable The patient underwent a left upper extremity vein mapping which was amenable to creation of a left brachio-basilic AV fistula creation.  He is currently being maintained by a right IJ permcath without any issue.  Procedure, risks and benefits explained. All questions answered. Patient wishes to proceed.  2. PVD (peripheral vascular disease) (HCC) - Stable Asymptomatic. Followed on a regular  basis.  3. Hyperlipidemia, unspecified hyperlipidemia type - Stable Encouraged good control as its slows the progression of atherosclerotic disease.  Current Outpatient Prescriptions on File Prior to Visit  Medication Sig Dispense Refill  . acetaminophen (TYLENOL) 500 MG tablet Take 1,000 mg by mouth every 6 (six) hours as needed for moderate pain.    [name] albuterol (PROVENTIL HFA;VENTOLIN HFA) 108 (90 BASE) MCG/ACT inhaler Inhale 2 puffs into the lungs every 6 (six) hours as needed for wheezing or shortness of breath.     [name] aspirin 325 MG tablet Take 325 mg by mouth every morning.     [name] atorvastatin (LIPITOR) 10 MG tablet Take 10 mg by mouth every morning.     . budesonide-formoterol (SYMBICORT) 160-4.5 MCG/ACT inhaler Inhale 2 puffs into the lungs 2 (two) times daily. Reported on 08/14/2015    . calcium acetate (PHOSLO) 667 MG capsule Take 667 mg by mouth 3 (three) times daily with meals.    . Darbepoetin Alfa (ARANESP) 40 MCG/0.4ML SOSY injection Inject 0.4 mLs (40 mcg total) into the vein every Monday with hemodialysis. 8.4 mL   . dextromethorphan (DELSYM) 30 MG/5ML liquid Take 5 mLs (30 mg  total) by mouth 2 (two) times daily. 89 mL 0  . docusate sodium (COLACE) 100 MG capsule Take 100 mg by mouth 2 (two) times daily. Reported on 08/14/2015    . doxercalciferol (HECTOROL) 4 MCG/2ML injection Inject 1.25 mLs (2.5 mcg total) into the vein every Monday, Wednesday, and Friday with hemodialysis. 2 mL   . ferric gluconate 125 mg in sodium chloride 0.9 % 100 mL Inject 125 mg into the vein every Monday, Wednesday, and Friday with hemodialysis.    [name] guaiFENesin (MUCINEX) 600 MG 12 hr tablet Take 1 tablet (600 mg total) by mouth 2 (two) times daily. 20 tablet 0  . guaiFENesin (ROBITUSSIN) 100 MG/5ML liquid Take 200 mg by mouth 3 (three) times daily as needed for cough.    [name] ipratropium-albuterol (DUONEB) 0.5-2.5 (3) MG/3ML SOLN Take 3 mLs by nebulization every 4 (four) hours as needed (SOB).    [name] KIONEX 15 GM/60ML suspension Take 60 mLs by mouth once.    [name] levofloxacin (LEVAQUIN) 500 MG tablet Take 1 tablet (500 mg total) by mouth every other day. 3 tablet 0  . metoprolol tartrate (LOPRESSOR) 25 MG tablet Take 25 mg by mouth 2 (two) times daily.      . midodrine (PROAMATINE) 5 MG tablet Take 5 mg by mouth. 3 times a week, 30 minutes before dialysis    . multivitamin (RENA-VIT) TABS tablet Take 1 tablet by mouth at bedtime.  0  . Nutritional Supplements (FEEDING SUPPLEMENT, NEPRO CARB STEADY,) LIQD Take 237 mLs by mouth daily.    . OxyCODONE HCl, Abuse Deter, (OXAYDO) 5 MG TABA Take 1 tablet by mouth every 8 (eight) hours as needed. 30 tablet 0  . oxyCODONE-acetaminophen (PERCOCET) 7.5-325 MG tablet Take 1 tablet by mouth every 4 (four) hours as needed for severe pain. 30 tablet 0  . pantoprazole (PROTONIX) 20 MG tablet Take 20 mg by mouth daily.    . polyethylene glycol (MIRALAX / GLYCOLAX) packet Take 17 g by mouth every morning. Hold for loose stools.    . pregabalin (LYRICA) 75 MG capsule Take 75 mg by mouth 2 (two) times daily.    . sevelamer carbonate (RENVELA) 800 MG tablet Take 800 mg by  mouth daily.     [name] tiotropium (SPIRIVA) 18 MCG inhalation capsule Place 18 mcg into  inhaler and inhale daily.    [name] triamcinolone cream (KENALOG) 0.1 % Apply 1 application topically daily as needed.     No current facility-administered medications on file prior to visit.     There are no Patient Instructions on file for this visit. No Follow-up on file.   Becker Christopher A Jeanine Caven, PA-C

## 2016-06-17 ENCOUNTER — Telehealth (INDEPENDENT_AMBULATORY_CARE_PROVIDER_SITE_OTHER): Payer: Self-pay

## 2016-06-17 ENCOUNTER — Other Ambulatory Visit (INDEPENDENT_AMBULATORY_CARE_PROVIDER_SITE_OTHER): Payer: Self-pay

## 2016-06-17 ENCOUNTER — Other Ambulatory Visit (INDEPENDENT_AMBULATORY_CARE_PROVIDER_SITE_OTHER): Payer: Self-pay | Admitting: Vascular Surgery

## 2016-06-17 ENCOUNTER — Encounter (INDEPENDENT_AMBULATORY_CARE_PROVIDER_SITE_OTHER): Payer: Self-pay

## 2016-06-17 NOTE — Telephone Encounter (Signed)
I have contacted [facility] twice today in order to schedule the patient's surgery. I spoke with the person answering the phone and was transferred to another line and it just rang for 5 minutes. I will schedule the patient and fax the information to the rehab center with contact information if changes are needed.

## 2016-06-20 ENCOUNTER — Inpatient Hospital Stay: Admission: RE | Admit: 2016-06-20 | Payer: Medicare Other | Source: Ambulatory Visit

## 2016-06-20 ENCOUNTER — Telehealth (INDEPENDENT_AMBULATORY_CARE_PROVIDER_SITE_OTHER): Payer: Self-pay

## 2016-06-20 NOTE — Telephone Encounter (Signed)
Received a call from pre-admission stating the patient was a no show for pre-op today, I had them give me a new time and day then I called [facility] and spoke with Elta Guadeloupe and let him know that the patient was a no show, he was given th new day and time. He stated he would give the patient the new information tomorrow when he comes in. Patient is located at [facility], but I am unable to contact anyone there regarding the patient so I have to go through the dialysis center.

## 2016-06-25 ENCOUNTER — Encounter
Admission: RE | Admit: 2016-06-25 | Discharge: 2016-06-25 | Disposition: A | Discharge status: Home / Self Care | Payer: Medicare Other | Source: Ambulatory Visit | Attending: Vascular Surgery | Admitting: Vascular Surgery

## 2016-06-25 NOTE — Pre-Procedure Instructions (Signed)
Patient arrived at [facility] to pre-op for surgery tomorrow (January 31) with a rattling cough and expectorating a large amount of gray sputum. Stated he " has been sick for over a week, and think I got pneumonia". Ascultation of the lungs revealed decreased breath sounds.Dr. Delana Meyer office made aware of patient symptoms (spoke to Mickel Baas) and stated she will reschedule pre-op visit and cancel surgery for tomorrow. Also, [facility] (patient place of resident ) notified of patient condition (spoke to [employer]).t

## 2016-07-09 NOTE — Pre-Procedure Instructions (Addendum)
CALL TO [facility] THIS AM TO SEE IF PATIENT CLEARED FOR SURGERY. SPOKE WITH JENNIFER. STATES SHE WILL HAVE NURSE MANAGER TRY TO FIND OUT IF CLEARED. AT THIS TIME, DON RHONDA IS TRYING TO CONTACT [facility] TO SEE IF CLEARED FOR SURGERY. NOTIFIED LAURA AT SURGEON'S OFFICE PATIENT HAS NOT HAD PREOP SINCE SICK DAY OF PREVIOUS PREOP. IF NOT CLEARED, THEY WILL HAVE TO TALK WITH ANESTHESIA AND HAVE PT COME IN 2 HOURS EARLY 07/10/16 TO DETERMINE IF CAN PROCEED. TRISH IN SDS GIVEN THIS INFO ALSO.  SPOKE WITH RHONDA,DON AT [facility].STILL NO ANSWER FROM PCP RE CLEARANCE. ASKED HER TO FAX INFO TO SDS AND SURGEON WHEN RECEIVED. ALSO GAVE PREOP INSTRUCTIONS:  NPO AFTER MIDNIGHT, AND MEDS TO TAKE IN AM IF SURGERY PROCEEDS. IS GOING TO CALL SDS TO GET TIME OF ARRIVAL. SPOKE WITH PAT RN IN SDS AND GIVEN ABOVE INFO. NOTIFIED April AT SURGEONS OFFICE.

## 2016-07-10 ENCOUNTER — Ambulatory Visit: Payer: Medicare Other | Admitting: Anesthesiology

## 2016-07-10 MED ORDER — CHLORHEXIDINE GLUCONATE CLOTH 2 % EX PADS: 6.0000 | MEDICATED_PAD | Freq: Once | CUTANEOUS | Status: DC

## 2016-07-10 MED ORDER — CEFAZOLIN SODIUM-DEXTROSE 2-4 GM/100ML-% IV SOLN: 2.0000 g | INTRAVENOUS | Status: AC

## 2016-07-10 NOTE — Pre-Procedure Instructions (Addendum)
CALL FROM SDS NURSE RE PATIENT CLEARANCE. NONE RECEIVED. SPOKE WITH DR P CARROLL WHO SPOKE WITH DR [name] AND PT TO COME 2 HOURS PREOP TO BE ASSESSED.LM FOR RHONDA AT [facility]. UNABLE TO REACH FLOOR NURSE. LM FOR SDS NURSE.  SDS NURSE ,DEBBIE TALKED WITH [facility]. PATIENT HAD CXR 06/29/16 SHOWING  PNEUMONIA,IS AT DIALYSIS TODAY.  THIS NURSE SPOKE WITH April AT DR [name],S. INSTRUCTED HER THAT PATIENT NEEDS CLEARANCE NOTE AND REPEAT CXR SHOWING RESOLUTION OF PNEUMONIA AND NEW PREOP VISIT AT [facility] AFTER THOSE THINGS ARE OBTAINED

## 2016-07-15 ENCOUNTER — Encounter (INDEPENDENT_AMBULATORY_CARE_PROVIDER_SITE_OTHER): Payer: Self-pay

## 2016-07-24 NOTE — Pre-Procedure Instructions (Signed)
CALL TO LAURA AT DR [name]'S. NO CLEARANCE OR REPEAT CXR RECEIVED FROM [facility]. SPOKE WITH DON,RHONDA AND SHE IS CHECKING ON STATUS OF BOTH. CALL BACK FROM LAURA AND DR Lucky Cowboy SAID BRING PATIENT IN EARLY FOR LABS, CXR AND ASSESSMENT. NOTIFIED DEBBIE IN SDS TO HAVE COME IN AT LEAST 2 HOURS EARLT FOR ABOVE. LM FOR RHONDA AT [place]

## 2016-07-24 NOTE — Patient Instructions (Signed)
  Your procedure is scheduled on: 07/25/16 Report to Day Surgery. [facility] SECOND FLOOR To find out your arrival time please call [phone] between 1PM - 3PM on 07/24/16 Remember: Instructions that are not followed completely may result in serious medical risk, up to and including death, or upon the discretion of your surgeon and anesthesiologist your surgery may need to be rescheduled.    ___X_ 1. Do not eat food or drink liquids after midnight. No gum chewing or hard candies.     ____ 2. No Alcohol for 24 hours before or after surgery.   ____ 3. Do Not Smoke For 24 Hours Prior to Your Surgery.   ____ 4. Bring all medications with you on the day of surgery if instructed.    __X__ 5. Notify your doctor if there is any change in your medical condition     (cold, fever, infections).       Do not wear jewelry, make-up, hairpins, clips or nail polish.  Do not wear lotions, powders, or perfumes. You may wear deodorant.  Do not shave 48 hours prior to surgery. Men may shave face and neck.  Do not bring valuables to the hospital.    [facility] is not responsible for any belongings or valuables.               Contacts, dentures or bridgework may not be worn into surgery.  Leave your suitcase in the car. After surgery it may be brought to your room.  For patients admitted to the hospital, discharge time is determined by your                treatment team.   Patients discharged the day of surgery will not be allowed to drive home.      __X__ Take these medicines the morning of surgery with A SIP OF WATER:    1.METOPROLOL  2. LYRICA  3. PROTONIX  4.  5.  6.  ____ Fleet Enema (as directed)   ____ Use CHG Soap as directed  _X___ Use inhalers on the day of surgery  ____ Stop metformin 2 days prior to surgery    ____ Take 1/2 of usual insulin dose the night before surgery and none on the morning of surgery.   ____ Stop Coumadin/Plavix/aspirin on  ____ Stop  Anti-inflammatories on   ____ Stop supplements until after surgery.    ____ Bring C-Pap to the hospital.

## 2016-07-24 NOTE — Pre-Procedure Instructions (Signed)
SPOKE WITH RHONDA,DON AT [place]. PATIENT HAVING CXR AND BEING SEEN BY PCP. REQUESTED SHE FAX RESULTS AND NOTES TO SDS AS SOON AS AVAILABLE. GIVEN PREOP INSTRUCTIONS REGARDING NPO/MEDS/INHALERS.

## 2016-07-25 ENCOUNTER — Ambulatory Visit
Admission: RE | Admit: 2016-07-25 | Discharge: 2016-07-25 | Disposition: A | Discharge status: Skilled Nursing Facility | Payer: Medicare Other | Source: Ambulatory Visit | Attending: Vascular Surgery | Admitting: Vascular Surgery

## 2016-07-25 ENCOUNTER — Ambulatory Visit: Payer: Medicare Other

## 2016-07-25 ENCOUNTER — Encounter: Payer: Self-pay | Admitting: *Deleted

## 2016-07-25 ENCOUNTER — Encounter
Admission: RE | Disposition: A | Discharge status: Skilled Nursing Facility | Payer: Self-pay | Source: Ambulatory Visit | Attending: Vascular Surgery

## 2016-07-25 DIAGNOSIS — E875 Hyperkalemia: Secondary | ICD-10-CM | POA: Diagnosis not present

## 2016-07-25 DIAGNOSIS — N186 End stage renal disease: Secondary | ICD-10-CM | POA: Diagnosis not present

## 2016-07-25 DIAGNOSIS — T80219A Unspecified infection due to central venous catheter, initial encounter: Secondary | ICD-10-CM | POA: Diagnosis not present

## 2016-07-25 DIAGNOSIS — Z539 Procedure and treatment not carried out, unspecified reason: Secondary | ICD-10-CM | POA: Insufficient documentation

## 2016-07-25 DIAGNOSIS — Z01818 Encounter for other preprocedural examination: Secondary | ICD-10-CM

## 2016-07-25 DIAGNOSIS — I10 Essential (primary) hypertension: Secondary | ICD-10-CM | POA: Diagnosis not present

## 2016-07-25 LAB — BASIC METABOLIC PANEL
Anion gap: 14 (ref 5–15)
BUN: 70 mg/dL — AB (ref 6–20)
CO2: 23 mmol/L (ref 22–32)
CREATININE: 10.31 mg/dL — AB (ref 0.61–1.24)
Calcium: 9.4 mg/dL (ref 8.9–10.3)
Chloride: 100 mmol/L — ABNORMAL LOW (ref 101–111)
GFR, EST AFRICAN AMERICAN: 5 mL/min — AB (ref >60)
GFR, EST NON AFRICAN AMERICAN: 5 mL/min — AB (ref >60)
Glucose, Bld: 95 mg/dL (ref 65–99)
POTASSIUM: 5.6 mmol/L — AB (ref 3.5–5.1)
Sodium: 137 mmol/L (ref 135–145)

## 2016-07-25 LAB — CBC WITH DIFFERENTIAL/PLATELET
BASOS ABS: 0 10*3/uL (ref 0–0.1)
BLASTS: 0 %
Band Neutrophils: 0 %
Basophils Relative: 0 %
Eosinophils Absolute: 0.5 10*3/uL (ref 0–0.7)
Eosinophils Relative: 4 %
HEMATOCRIT: 38.1 % — AB (ref 40.0–52.0)
HEMOGLOBIN: 12.8 g/dL — AB (ref 13.0–18.0)
Lymphocytes Relative: 41 %
Lymphs Abs: 5.1 10*3/uL — ABNORMAL HIGH (ref 1.0–3.6)
MCH: 31.3 pg (ref 26.0–34.0)
MCHC: 33.6 g/dL (ref 32.0–36.0)
MCV: 93.1 fL (ref 80.0–100.0)
METAMYELOCYTES PCT: 1 %
MYELOCYTES: 0 %
Monocytes Absolute: 0.9 10*3/uL (ref 0.2–1.0)
Monocytes Relative: 7 %
Neutro Abs: 6 10*3/uL (ref 1.4–6.5)
Neutrophils Relative %: 47 %
Other: 0 %
PLATELETS: 119 10*3/uL — AB (ref 150–440)
Promyelocytes Absolute: 0 %
RBC: 4.09 MIL/uL — AB (ref 4.40–5.90)
RDW: 17.4 % — ABNORMAL HIGH (ref 11.5–14.5)
WBC: 12.5 10*3/uL — ABNORMAL HIGH (ref 3.8–10.6)
nRBC: 0 /100 WBC

## 2016-07-25 LAB — POCT I-STAT 4, (NA,K, GLUC, HGB,HCT)
Glucose, Bld: 91 mg/dL (ref 65–99)
HEMATOCRIT: 38 % — AB (ref 39.0–52.0)
HEMOGLOBIN: 12.9 g/dL — AB (ref 13.0–17.0)
Potassium: 5.8 mmol/L — ABNORMAL HIGH (ref 3.5–5.1)
SODIUM: 137 mmol/L (ref 135–145)

## 2016-07-25 LAB — TYPE AND SCREEN
ABO/RH(D): B POS
ANTIBODY SCREEN: NEGATIVE

## 2016-07-25 LAB — APTT: APTT: 35 s (ref 24–36)

## 2016-07-25 LAB — PROTIME-INR
INR: 1.14
Prothrombin Time: 14.7 seconds (ref 11.4–15.2)

## 2016-07-25 LAB — SURGICAL PCR SCREEN
MRSA, PCR: NEGATIVE
STAPHYLOCOCCUS AUREUS: NEGATIVE

## 2016-07-25 SURGERY — ARTERIOVENOUS (AV) FISTULA CREATION
Anesthesia: General | Laterality: Left

## 2016-07-25 MED ORDER — PROPOFOL 10 MG/ML IV BOLUS
INTRAVENOUS | Status: AC
  Filled 2016-07-25: qty 20

## 2016-07-25 MED ORDER — MIDAZOLAM HCL 2 MG/2ML IJ SOLN
INTRAMUSCULAR | Status: AC
  Filled 2016-07-25: qty 2

## 2016-07-25 MED ORDER — CEFAZOLIN SODIUM-DEXTROSE 2-4 GM/100ML-% IV SOLN
INTRAVENOUS | Status: AC
  Filled 2016-07-25: qty 100

## 2016-07-25 MED ORDER — LIDOCAINE HCL (PF) 1 % IJ SOLN
INTRAMUSCULAR | Status: AC
  Filled 2016-07-25: qty 5

## 2016-07-25 MED ORDER — ROPIVACAINE HCL 5 MG/ML IJ SOLN
INTRAMUSCULAR | Status: AC
  Filled 2016-07-25: qty 40

## 2016-07-25 MED ORDER — LIDOCAINE HCL (PF) 2 % IJ SOLN
INTRAMUSCULAR | Status: AC
Start: 2016-07-25 — End: 2016-07-25
  Filled 2016-07-25: qty 2

## 2016-07-25 MED ORDER — LIDOCAINE HCL (PF) 2 % IJ SOLN
INTRAMUSCULAR | Status: AC
Start: 2016-07-25 — End: 2016-07-25
  Filled 2016-07-25: qty 4

## 2016-07-25 MED ORDER — SODIUM CHLORIDE 0.9 % IV SOLN
INTRAVENOUS | Status: DC
  Administered 2016-07-25: 14:00:00 via INTRAVENOUS

## 2016-07-25 SURGICAL SUPPLY — 50 items
BAG DECANTER FOR FLEXI CONT (MISCELLANEOUS) ×2 IMPLANT
BLADE SURG SZ11 CARB STEEL (BLADE) ×2 IMPLANT
BOOT SUTURE AID YELLOW STND (SUTURE) ×2 IMPLANT
BRUSH SCRUB 4% CHG (MISCELLANEOUS) ×2 IMPLANT
CANISTER SUCT 1200ML W/VALVE (MISCELLANEOUS) ×2 IMPLANT
CHLORAPREP W/TINT 26ML (MISCELLANEOUS) ×2 IMPLANT
CLIP SPRNG 6MM S-JAW DBL (CLIP) ×2
DERMABOND ADVANCED (GAUZE/BANDAGES/DRESSINGS) ×1
DERMABOND ADVANCED .7 DNX12 (GAUZE/BANDAGES/DRESSINGS) ×1 IMPLANT
ELECT CAUTERY BLADE 6.4 (BLADE) ×2 IMPLANT
ELECT REM PT RETURN 9FT ADLT (ELECTROSURGICAL) ×2
ELECTRODE REM PT RTRN 9FT ADLT (ELECTROSURGICAL) ×1 IMPLANT
GEL ULTRASOUND 20GR AQUASONIC (MISCELLANEOUS) IMPLANT
GLOVE BIO SURGEON STRL SZ7 (GLOVE) ×4 IMPLANT
GLOVE INDICATOR 7.5 STRL GRN (GLOVE) ×2 IMPLANT
GOWN STRL REUS W/ TWL LRG LVL3 (GOWN DISPOSABLE) ×2 IMPLANT
GOWN STRL REUS W/ TWL XL LVL3 (GOWN DISPOSABLE) ×1 IMPLANT
GOWN STRL REUS W/TWL LRG LVL3 (GOWN DISPOSABLE) ×2
GOWN STRL REUS W/TWL XL LVL3 (GOWN DISPOSABLE) ×1
HEMOSTAT SURGICEL 2X3 (HEMOSTASIS) ×2 IMPLANT
IV NS 500ML (IV SOLUTION) ×1
IV NS 500ML BAXH (IV SOLUTION) ×1 IMPLANT
KIT RM TURNOVER STRD PROC AR (KITS) ×2 IMPLANT
LABEL OR SOLS (LABEL) ×2 IMPLANT
LOOP RED MAXI  1X406MM (MISCELLANEOUS) ×1
LOOP VESSEL MAXI 1X406 RED (MISCELLANEOUS) ×1 IMPLANT
LOOP VESSEL MINI 0.8X406 BLUE (MISCELLANEOUS) ×1 IMPLANT
LOOPS BLUE MINI 0.8X406MM (MISCELLANEOUS) ×1
NEEDLE FILTER BLUNT 18X 1/2SAF (NEEDLE) ×1
NEEDLE FILTER BLUNT 18X1 1/2 (NEEDLE) ×1 IMPLANT
NEEDLE HYPO 30X.5 LL (NEEDLE) IMPLANT
NS IRRIG 500ML POUR BTL (IV SOLUTION) ×2 IMPLANT
PACK EXTREMITY ARMC (MISCELLANEOUS) ×2 IMPLANT
PAD PREP 24X41 OB/GYN DISP (PERSONAL CARE ITEMS) ×2 IMPLANT
SOLUTION CELL SAVER (CLIP) ×1 IMPLANT
STOCKINETTE STRL 4IN 9604848 (GAUZE/BANDAGES/DRESSINGS) ×2 IMPLANT
SUT MNCRL AB 4-0 PS2 18 (SUTURE) ×2 IMPLANT
SUT PROLENE 6 0 BV (SUTURE) ×8 IMPLANT
SUT SILK 2 0 (SUTURE) ×1
SUT SILK 2-0 18XBRD TIE 12 (SUTURE) ×1 IMPLANT
SUT SILK 3 0 (SUTURE) ×1
SUT SILK 3-0 18XBRD TIE 12 (SUTURE) ×1 IMPLANT
SUT SILK 4 0 (SUTURE) ×1
SUT SILK 4-0 18XBRD TIE 12 (SUTURE) ×1 IMPLANT
SUT VIC AB 3-0 SH 27 (SUTURE) ×2
SUT VIC AB 3-0 SH 27X BRD (SUTURE) ×2 IMPLANT
SYR 20CC LL (SYRINGE) ×2 IMPLANT
SYR 3ML LL SCALE MARK (SYRINGE) ×2 IMPLANT
SYR TB 1ML 27GX1/2 LL (SYRINGE) IMPLANT
TOWEL OR 17X26 4PK STRL BLUE (TOWEL DISPOSABLE) IMPLANT

## 2016-07-25 NOTE — OR Nursing (Signed)
Dr. Amie Critchley in to speak with patient re potassium level and that case would need to be rescheduled.

## 2016-07-25 NOTE — OR Nursing (Signed)
Meds/npo status reviewed via tele by [facility] with [facility]

## 2016-07-25 NOTE — H&P (Signed)
[facility] Admission History & Physical  MRN : [id number]  [name] is a 61 y.o. ([birth year]) male who presents with chief complaint of No chief complaint on file. [name]  History of Present Illness: Patient presents for elective dialysis access placement. He is multiple previous dialysis access placements and has had a revision of his right arm fistula on multiple occasions. This is now failed and he needs a new axis. We are now moving to his left arm he appears to have anatomy but a suitable for a brachiocephalic AV fistula creation. Recently had a respiratory illness (improved. No other complaints today.  Current Facility-Administered Medications  Medication Dose Route Frequency Provider Last Rate Last Dose  . 0.9 %  sodium chloride infusion   Intravenous Continuous [name], MD 50 mL/hr at 07/25/16 1344    . ceFAZolin (ANCEF) 2-4 GM/100ML-% IVPB           . Chlorhexidine Gluconate Cloth 2 % PADS 6 each  6 each Topical Once [employer], PA-C       And  . Chlorhexidine Gluconate Cloth 2 % PADS 6 each  6 each Topical Once [name], PA-C        Past Medical History:  Diagnosis Date  . Anemia   . Asthma   . CHF (congestive heart failure) ([place])   . Chronic systolic heart failure ([place])   . Complication of anesthesia    hypotension  . COPD (chronic obstructive pulmonary disease) ([place])   . Coronary artery disease   . Dialysis patient ([place])    Mon, Wed, Fri  . End stage renal disease ([place])   . GERD (gastroesophageal reflux disease)   . Headache   . History of kidney stones   . HLD (hyperlipidemia)   . HTN (hypertension)   . Hyperparathyroidism   . Myocardial infarction   . Peripheral vascular disease ([place])   . Shortness of breath dyspnea   . Sleep apnea   . Tobacco dependence     Past Surgical History:  Procedure Laterality Date  . AMPUTATION Left 05/06/2014   Procedure: AMPUTATION BELOW KNEE;  Surgeon: Elam Dutch, MD;  Location: [place];  Service: Vascular;  Laterality: Left;  . AMPUTATION Right 01/12/2015   Procedure: Foot transmetatarsal amputation;  Surgeon: Algernon Huxley, MD;  Location: [facility];  Service: Vascular;  Laterality: Right;  . APPLICATION OF WOUND VAC Right 03/01/2015   Procedure: Application of Bio-connekt graft and wound vac application to right foot ;  Surgeon: Algernon Huxley, MD;  Location: [facility];  Service: Vascular;  Laterality: Right;  . AV FISTULA PLACEMENT Left   . CARDIAC CATHETERIZATION     stent placement   . CORONARY ANGIOPLASTY    . LIGATION OF ARTERIOVENOUS  FISTULA Right 01/31/2016   Procedure: LIGATION OF ARTERIOVENOUS  FISTULA;  Surgeon: Algernon Huxley, MD;  Location: [facility];  Service: Vascular;  Laterality: Right;  . PERIPHERAL VASCULAR CATHETERIZATION Right 12/15/2014   Procedure: Lower Extremity Angiography;  Surgeon: Algernon Huxley, MD;  Location: [place] [facility];  Service: Cardiovascular;  Laterality: Right;  . PERIPHERAL VASCULAR CATHETERIZATION  12/15/2014   Procedure: Lower Extremity Intervention;  Surgeon: Algernon Huxley, MD;  Location: [place] [facility];  Service: Cardiovascular;;  . PERIPHERAL VASCULAR CATHETERIZATION Right 08/14/2015   Procedure: A/V Shuntogram/Fistulagram;  Surgeon: Algernon Huxley, MD;  Location: [place] [facility];  Service: Cardiovascular;  Laterality: Right;  .  PERIPHERAL VASCULAR CATHETERIZATION N/A 08/14/2015   Procedure: A/V Shunt Intervention;  Surgeon: Algernon Huxley, MD;  Location: [place] [facility];  Service: Cardiovascular;  Laterality: N/A;  . PERIPHERAL VASCULAR CATHETERIZATION N/A 01/11/2016   Procedure: Dialysis/Perma Catheter Insertion;  Surgeon: Algernon Huxley, MD;  Location: [place] [facility];  Service: Cardiovascular;  Laterality: N/A;  . REVISON OF ARTERIOVENOUS FISTULA Right 02/17/2016   Procedure: removal of AV fistula;  Surgeon: Serafina Mitchell, MD;  Location: [facility];  Service: Vascular;  Laterality: Right;  . REVISON OF  ARTERIOVENOUS FISTULA Right 01/31/2016   Procedure: REVISON OF ARTERIOVENOUS FISTULA ( BRACHIOCEPHALIC ) W/ ARTEGRAFT;  Surgeon: Algernon Huxley, MD;  Location: [facility];  Service: Vascular;  Laterality: Right;  . TRANSMETATARSAL AMPUTATION Right 05/04/2015   Procedure: TRANSMETATARSAL AMPUTATION REVISION, great toe amputation;  Surgeon: Algernon Huxley, MD;  Location: [facility];  Service: Vascular;  Laterality: Right;    Social History Social History  Substance Use Topics  . Smoking status: Light Tobacco Smoker    Packs/day: 0.25    Types: Cigarettes  . Smokeless tobacco: Never Used     Comment: 5  . Alcohol use No  Lives in a nursing facility   Family History Family History  Problem Relation Age of Onset  . Heart failure Other   . Hypertension Other   . Leukemia Other   . Diabetes Other   No bleeding disorders, clotting disorders, autoimmune diseases, or aneurysms  Allergies  Allergen Reactions  . Dust Mite Extract      REVIEW OF SYSTEMS (Negative unless checked)  Constitutional: [] Weight loss  [] Fever  [] Chills Cardiac: [x] Chest pain   [] Chest pressure   [x] Palpitations   [] Shortness of breath when laying flat   [] Shortness of breath at rest   [] Shortness of breath with exertion. Vascular:  [] Pain in legs with walking   [] Pain in legs at rest   [] Pain in legs when laying flat   [] Claudication   [] Pain in feet when walking  [] Pain in feet at rest  [] Pain in feet when laying flat   [] History of DVT   [] Phlebitis   [] Swelling in legs   [] Varicose veins   [x] Non-healing ulcers Pulmonary:   [] Uses home oxygen   [] Productive cough   [] Hemoptysis   [] Wheeze  [x] COPD   [] Asthma Neurologic:  [] Dizziness  [] Blackouts   [] Seizures   [] History of stroke   [] History of TIA  [] Aphasia   [] Temporary blindness   [] Dysphagia   [] Weakness or numbness in arms   [] Weakness or numbness in legs Musculoskeletal:  [] Arthritis   [] Joint swelling   [] Joint pain   [] Low back pain Hematologic:  [] Easy bruising   [] Easy bleeding   [] Hypercoagulable state   [] Anemic  [] Hepatitis Gastrointestinal:  [] Blood in stool   [] Vomiting blood  [] Gastroesophageal reflux/heartburn   [] Difficulty swallowing. Genitourinary:  [x] Chronic kidney disease   [] Difficult urination  [] Frequent urination  [] Burning with urination   [] Blood in urine Skin:  [] Rashes   [] Ulcers   [] Wounds Psychological:  [] History of anxiety   []  History of major depression.  Physical Examination  Vitals:   07/25/16 1317 07/25/16 1346  BP: (!) 140/98   Pulse: 84   Resp: 16   Temp: 100.1 F (37.8 C) 98.6 F (37 C)  TempSrc: Oral Tympanic  SpO2: 98%   Weight: 80.3 kg (177 lb)   Height: 6\' 2"  (1.88 m)    Body mass index is 22.73 kg/m. Gen: WD/WN, NAD  Head: [place]/AT, No temporalis wasting. Prominent temp pulse not noted. Ear/Nose/Throat: Hearing grossly intact, nares w/o erythema or drainage, oropharynx w/o Erythema/Exudate,  Eyes: Conjunctiva clear, sclera non-icteric Neck: Trachea midline.  No JVD.  Pulmonary:  Good air movement, respirations not labored, no use of accessory muscles.  Cardiac: RRR, normal S1, S2. Vascular: Allens test is normal on the left arm Vessel Right Left  Radial Palpable Palpable                                   Gastrointestinal: soft, non-tender/non-distended. No guarding/reflex.  Musculoskeletal: M/S 5/5 throughout.  Left leg amputation. Right transmetatarsal amputation. Previous access in the right arm  Neurologic: Sensation grossly intact in extremities.  Symmetrical.  Speech is fluent. Motor exam as listed above. Psychiatric: Judgment intact, Mood & affect appropriate for pt's clinical situation. Dermatologic: No rashes or ulcers noted.  No cellulitis or open wounds. Lymph : No Cervical, Axillary, or Inguinal lymphadenopathy.     CBC Lab Results  Component Value Date   WBC 12.5 (H) 07/25/2016   HGB 12.8 (L) 07/25/2016   HCT 38.1 (L) 07/25/2016   MCV 93.1 07/25/2016   PLT 119 (L)  07/25/2016    BMET    Component Value Date/Time   NA 138 05/23/2016 0450   NA 135 (L) 04/12/2014 1341   K 5.6 (H) 05/24/2016 0452   K 5.2 01/12/2015 1210   K 3.6 04/12/2014 1341   CL 101 05/23/2016 0450   CL 97 (L) 04/12/2014 1341   CO2 23 05/23/2016 0450   CO2 28 04/12/2014 1341   GLUCOSE 83 05/23/2016 0450   GLUCOSE 85 04/12/2014 1341   BUN 106 (H) 05/23/2016 0450   BUN 40 (H) 04/12/2014 1341   CREATININE 11.64 (H) 05/23/2016 0450   CREATININE 9.58 (H) 04/12/2014 1341   CALCIUM 8.8 (L) 05/23/2016 0450   CALCIUM 9.5 04/12/2014 1341   GFRNONAA 4 (L) 05/23/2016 0450   GFRNONAA 6 (L) 03/15/2014 2323   GFRNONAA 6 (L) 02/10/2014 1119   GFRAA 5 (L) 05/23/2016 0450   GFRAA 7 (L) 03/15/2014 2323   GFRAA 7 (L) 02/10/2014 1119   CrCl cannot be calculated (Patient's most recent lab result is older than the maximum 21 days allowed.).  COAG Lab Results  Component Value Date   INR 1.16 04/04/2016   INR 1.22 01/17/2016   INR 1.18 04/27/2015    Radiology X-ray Chest Pa Or Ap  Result Date: 07/25/2016 CLINICAL DATA:  Preop vascular procedure EXAM: CHEST 1 VIEW COMPARISON:  05/21/2016 FINDINGS: Trace right pleural effusion. Bilateral mild interstitial thickening. No pneumothorax. No focal consolidation. Stable cardiomegaly. Dual-lumen right-sided central venous catheter in unchanged position. No acute osseous abnormality. IMPRESSION: Cardiomegaly with mild pulmonary vascular congestion. Electronically Signed   By: Kathreen Devoid   On: 07/25/2016 13:36     Assessment/Plan 1. ESRD. Needs new permanent access.  Planning left arm AVF, likely brachiobasilic.  Risks and benefits discussed with patient 2. HTN. Stable on outpatient medications and blood pressure control important in reducing the progression of atherosclerotic disease. On appropriate oral medications. 3. PAD.  Left leg amputation, right TMA and previous revascularization procedures. 4. CAD. Has angina and is stable.     Leotis Pain, MD  07/25/2016 1:54 PM

## 2016-07-25 NOTE — OR Nursing (Signed)
Abo/rh drawn by preop nurse, tubed to lab 1340 - lab notified it had been tubed

## 2016-07-25 NOTE — Anesthesia Preprocedure Evaluation (Addendum)
Anesthesia Evaluation  Patient identified by MRN, date of birth, ID band Patient awake    Reviewed: Allergy & Precautions, H&P , NPO status , Patient's Chart, lab work & pertinent test results  History of Anesthesia Complications Negative for: history of anesthetic complications  Airway Mallampati: III  TM Distance: >3 FB Neck ROM: limited    Dental no notable dental hx. (+) Poor Dentition, Chipped, Missing   Pulmonary shortness of breath and with exertion, asthma , sleep apnea , pneumonia, COPD, Current Smoker,    Pulmonary exam normal breath sounds clear to auscultation       Cardiovascular Exercise Tolerance: Poor hypertension, + angina with exertion + CAD, + Past MI, + Cardiac Stents, + Peripheral Vascular Disease, +CHF and + DOE  Normal cardiovascular examIII Rhythm:regular Rate:Normal     Neuro/Psych  Headaches, PSYCHIATRIC DISORDERS    GI/Hepatic Neg liver ROS, GERD  Controlled,  Endo/Other  diabetes, Poorly Controlled, Type 2, Insulin Dependent  Renal/GU ESRF and DialysisRenal disease     Musculoskeletal  (+) Arthritis ,   Abdominal   Peds  Hematology  (+) Blood dyscrasia, anemia ,   Anesthesia Other Findings Patient endorses some baseline weakness and numbness in surgical arm.  Past Medical History: No date: Anemia No date: Arthritis No date: Asthma No date: CHF (congestive heart failure) (H[facility]) No date: Chronic systolic heart failure ([facility]) No date: Complication of anesthesia     Comment: hypotension No date: COPD (chronic obstructive pulmonary disease) (* No date: Coronary artery disease No date: Diabetes mellitus without complication ([facility]) No date: Dialysis patient [facility])     Comment: Mon, Wed, Fri No date: End stage renal disease ([place]) No date: GERD (gastroesophageal reflux disease) No date: Headache No date: HLD (hyperlipidemia) No date: HTN (hypertension) No date: Hyperparathyroidism No  date: Myocardial infarction ([place]) No date: Peripheral vascular disease ([place]) No date: Shortness of breath dyspnea No date: Sleep apnea No date: Tobacco dependence  Past Surgical History: 05/06/2014: AMPUTATION Left     Comment: Procedure: AMPUTATION BELOW KNEE;  Surgeon:               Elam Dutch, MD;  Location: [place];                Service: Vascular;  Laterality: Left; 01/12/2015: AMPUTATION Right     Comment: Procedure: Foot transmetatarsal amputation;                Surgeon: Algernon Huxley, MD;  Location: [facility];               Service: Vascular;  Laterality: Right; 18/09/6312: APPLICATION OF WOUND VAC Right     Comment: Procedure: Application of Bio-connekt graft               and wound vac application to right foot ;                Surgeon: Algernon Huxley, MD;  Location: [facility];               Service: Vascular;  Laterality: Right; No date: AV FISTULA PLACEMENT Left No date: CARDIAC CATHETERIZATION     Comment: stent placement  No date: CORONARY ANGIOPLASTY 12/15/2014: PERIPHERAL VASCULAR CATHETERIZATION Right     Comment: Procedure: Lower Extremity Angiography;                Surgeon: Algernon Huxley, MD;  Location: [facility]  IN[facility];  Service: Cardiovascular;                Laterality: Right; 12/15/2014: PERIPHERAL VASCULAR CATHETERIZATION     Comment: Procedure: Lower Extremity Intervention;                Surgeon: Algernon Huxley, MD;  Location: [place] [facility];  Service: Cardiovascular;; 08/14/2015: PERIPHERAL VASCULAR CATHETERIZATION Right     Comment: Procedure: A/V Shuntogram/Fistulagram;                Surgeon: Algernon Huxley, MD;  Location: [place] [facility];  Service: Cardiovascular;                Laterality: Right; 08/14/2015: PERIPHERAL VASCULAR CATHETERIZATION N/A     Comment: Procedure: A/V Shunt Intervention;  Surgeon:               Algernon Huxley, MD;  Location: [place] [facility];  Service:  Cardiovascular;  Laterality:               N/A; 01/11/2016: PERIPHERAL VASCULAR CATHETERIZATION N/A     Comment: Procedure: Dialysis/Perma Catheter Insertion;               Surgeon: Algernon Huxley, MD;  Location: [place] [facility];  Service: Cardiovascular;                Laterality: N/A; 05/04/2015: TRANSMETATARSAL AMPUTATION Right     Comment: Procedure: TRANSMETATARSAL AMPUTATION               REVISION, great toe amputation;  Surgeon: Algernon Huxley, MD;  Location: [facility];  Service:               Vascular;  Laterality: Right;     Reproductive/Obstetrics negative OB ROS                             Anesthesia Physical  Anesthesia Plan  ASA: IV  Anesthesia Plan: MAC and General   Post-op Pain Management:  Regional for Post-op pain   Induction:   Airway Management Planned:   Additional Equipment:   Intra-op Plan:   Post-operative Plan:   Informed Consent: I have reviewed the patients History and Physical, chart, labs and discussed the procedure including the risks, benefits and alternatives for the proposed anesthesia with the patient or authorized representative who has indicated his/her understanding and acceptance.     Plan Discussed with: Anesthesiologist, CRNA and Surgeon  Anesthesia Plan Comments: (Patient informed that they are higher risk for complications from anesthesia during this procedure due to their medical history.  Patient voiced understanding.  Would like to try to use the regional block as our primary anesthetic as I feel that any risks related with this block are lower than the risks of general anesthesia.  Discussed with patient who voiced understanding.)       Anesthesia Quick Evaluation

## 2016-07-25 NOTE — OR Nursing (Signed)
Iv d/c'd, [facility] called and pt assisted to front of med mall by [employer], and pt picked up by same.  [facility] called, unable to reach nurse on [facility], was transferred to [facility], [employer], left msg on VM re cancellation.

## 2016-07-25 NOTE — OR Nursing (Signed)
Portable CXR done by radiology techs @ 13:04; lab  Called for blood draw @ 13:00

## 2016-07-25 NOTE — OR Nursing (Signed)
Surgical PCR screen tubed to lab @ 13:12; lab tech in for blood draw @ 13:12

## 2016-07-26 ENCOUNTER — Emergency Department: Payer: Medicare Other

## 2016-07-26 ENCOUNTER — Inpatient Hospital Stay
Admission: EM | Admit: 2016-07-26 | Discharge: 2016-07-31 | DRG: 252 | Disposition: A | Discharge status: Skilled Nursing Facility | Payer: Medicare Other | Attending: Internal Medicine | Admitting: Internal Medicine

## 2016-07-26 DIAGNOSIS — Z7982 Long term (current) use of aspirin: Secondary | ICD-10-CM

## 2016-07-26 DIAGNOSIS — E1122 Type 2 diabetes mellitus with diabetic chronic kidney disease: Secondary | ICD-10-CM | POA: Diagnosis present

## 2016-07-26 DIAGNOSIS — I10 Essential (primary) hypertension: Secondary | ICD-10-CM | POA: Diagnosis not present

## 2016-07-26 DIAGNOSIS — N186 End stage renal disease: Secondary | ICD-10-CM | POA: Diagnosis present

## 2016-07-26 DIAGNOSIS — Y848 Other medical procedures as the cause of abnormal reaction of the patient, or of later complication, without mention of misadventure at the time of the procedure: Secondary | ICD-10-CM | POA: Diagnosis present

## 2016-07-26 DIAGNOSIS — J449 Chronic obstructive pulmonary disease, unspecified: Secondary | ICD-10-CM | POA: Diagnosis present

## 2016-07-26 DIAGNOSIS — E1151 Type 2 diabetes mellitus with diabetic peripheral angiopathy without gangrene: Secondary | ICD-10-CM | POA: Diagnosis present

## 2016-07-26 DIAGNOSIS — Z992 Dependence on renal dialysis: Secondary | ICD-10-CM | POA: Diagnosis not present

## 2016-07-26 DIAGNOSIS — R4182 Altered mental status, unspecified: Secondary | ICD-10-CM

## 2016-07-26 DIAGNOSIS — A4102 Sepsis due to Methicillin resistant Staphylococcus aureus: Secondary | ICD-10-CM | POA: Diagnosis present

## 2016-07-26 DIAGNOSIS — N2581 Secondary hyperparathyroidism of renal origin: Secondary | ICD-10-CM | POA: Diagnosis present

## 2016-07-26 DIAGNOSIS — I248 Other forms of acute ischemic heart disease: Secondary | ICD-10-CM | POA: Diagnosis present

## 2016-07-26 DIAGNOSIS — Z89512 Acquired absence of left leg below knee: Secondary | ICD-10-CM

## 2016-07-26 DIAGNOSIS — I252 Old myocardial infarction: Secondary | ICD-10-CM

## 2016-07-26 DIAGNOSIS — A419 Sepsis, unspecified organism: Secondary | ICD-10-CM | POA: Diagnosis not present

## 2016-07-26 DIAGNOSIS — I429 Cardiomyopathy, unspecified: Secondary | ICD-10-CM | POA: Diagnosis present

## 2016-07-26 DIAGNOSIS — Z89421 Acquired absence of other right toe(s): Secondary | ICD-10-CM

## 2016-07-26 DIAGNOSIS — G4730 Sleep apnea, unspecified: Secondary | ICD-10-CM | POA: Diagnosis present

## 2016-07-26 DIAGNOSIS — I82C12 Acute embolism and thrombosis of left internal jugular vein: Secondary | ICD-10-CM | POA: Diagnosis not present

## 2016-07-26 DIAGNOSIS — M542 Cervicalgia: Secondary | ICD-10-CM

## 2016-07-26 DIAGNOSIS — Z79899 Other long term (current) drug therapy: Secondary | ICD-10-CM

## 2016-07-26 DIAGNOSIS — T80219A Unspecified infection due to central venous catheter, initial encounter: Secondary | ICD-10-CM | POA: Diagnosis present

## 2016-07-26 DIAGNOSIS — I214 Non-ST elevation (NSTEMI) myocardial infarction: Secondary | ICD-10-CM | POA: Diagnosis present

## 2016-07-26 DIAGNOSIS — Z955 Presence of coronary angioplasty implant and graft: Secondary | ICD-10-CM

## 2016-07-26 DIAGNOSIS — I2510 Atherosclerotic heart disease of native coronary artery without angina pectoris: Secondary | ICD-10-CM | POA: Diagnosis present

## 2016-07-26 DIAGNOSIS — F17210 Nicotine dependence, cigarettes, uncomplicated: Secondary | ICD-10-CM | POA: Diagnosis present

## 2016-07-26 DIAGNOSIS — I5022 Chronic systolic (congestive) heart failure: Secondary | ICD-10-CM | POA: Diagnosis present

## 2016-07-26 DIAGNOSIS — G9341 Metabolic encephalopathy: Secondary | ICD-10-CM | POA: Diagnosis present

## 2016-07-26 DIAGNOSIS — K219 Gastro-esophageal reflux disease without esophagitis: Secondary | ICD-10-CM | POA: Diagnosis present

## 2016-07-26 DIAGNOSIS — I132 Hypertensive heart and chronic kidney disease with heart failure and with stage 5 chronic kidney disease, or end stage renal disease: Secondary | ICD-10-CM | POA: Diagnosis present

## 2016-07-26 DIAGNOSIS — I959 Hypotension, unspecified: Secondary | ICD-10-CM | POA: Diagnosis present

## 2016-07-26 DIAGNOSIS — R079 Chest pain, unspecified: Secondary | ICD-10-CM

## 2016-07-26 DIAGNOSIS — J9601 Acute respiratory failure with hypoxia: Secondary | ICD-10-CM | POA: Diagnosis present

## 2016-07-26 DIAGNOSIS — Z66 Do not resuscitate: Secondary | ICD-10-CM | POA: Diagnosis present

## 2016-07-26 DIAGNOSIS — E875 Hyperkalemia: Secondary | ICD-10-CM | POA: Diagnosis present

## 2016-07-26 DIAGNOSIS — E785 Hyperlipidemia, unspecified: Secondary | ICD-10-CM | POA: Diagnosis present

## 2016-07-26 DIAGNOSIS — D631 Anemia in chronic kidney disease: Secondary | ICD-10-CM | POA: Diagnosis present

## 2016-07-26 DIAGNOSIS — T82838A Hemorrhage of vascular prosthetic devices, implants and grafts, initial encounter: Secondary | ICD-10-CM

## 2016-07-26 DIAGNOSIS — T827XXA Infection and inflammatory reaction due to other cardiac and vascular devices, implants and grafts, initial encounter: Secondary | ICD-10-CM | POA: Diagnosis not present

## 2016-07-26 DIAGNOSIS — G9340 Encephalopathy, unspecified: Secondary | ICD-10-CM | POA: Diagnosis present

## 2016-07-26 LAB — BLOOD CULTURE ID PANEL (REFLEXED)
Acinetobacter baumannii: NOT DETECTED
Candida albicans: NOT DETECTED
Candida glabrata: NOT DETECTED
Candida krusei: NOT DETECTED
Candida parapsilosis: NOT DETECTED
Candida tropicalis: NOT DETECTED
ENTEROCOCCUS SPECIES: NOT DETECTED
Enterobacter cloacae complex: NOT DETECTED
Enterobacteriaceae species: NOT DETECTED
Escherichia coli: NOT DETECTED
Haemophilus influenzae: NOT DETECTED
Klebsiella oxytoca: NOT DETECTED
Klebsiella pneumoniae: NOT DETECTED
LISTERIA MONOCYTOGENES: NOT DETECTED
Methicillin resistance: DETECTED — AB
NEISSERIA MENINGITIDIS: NOT DETECTED
PROTEUS SPECIES: NOT DETECTED
Pseudomonas aeruginosa: NOT DETECTED
SERRATIA MARCESCENS: NOT DETECTED
STAPHYLOCOCCUS AUREUS BCID: DETECTED — AB
STAPHYLOCOCCUS SPECIES: DETECTED — AB
STREPTOCOCCUS AGALACTIAE: NOT DETECTED
STREPTOCOCCUS SPECIES: NOT DETECTED
Streptococcus pneumoniae: NOT DETECTED
Streptococcus pyogenes: NOT DETECTED

## 2016-07-26 LAB — CBC
HCT: 36.6 % — ABNORMAL LOW (ref 40.0–52.0)
Hemoglobin: 12.3 g/dL — ABNORMAL LOW (ref 13.0–18.0)
MCH: 30.9 pg (ref 26.0–34.0)
MCHC: 33.6 g/dL (ref 32.0–36.0)
MCV: 91.9 fL (ref 80.0–100.0)
PLATELETS: 117 10*3/uL — AB (ref 150–440)
RBC: 3.98 MIL/uL — AB (ref 4.40–5.90)
RDW: 17 % — ABNORMAL HIGH (ref 11.5–14.5)
WBC: 15 10*3/uL — AB (ref 3.8–10.6)

## 2016-07-26 LAB — GLUCOSE, CAPILLARY
GLUCOSE-CAPILLARY: 65 mg/dL (ref 65–99)
GLUCOSE-CAPILLARY: 90 mg/dL (ref 65–99)
Glucose-Capillary: 78 mg/dL (ref 65–99)
Glucose-Capillary: 107 mg/dL — ABNORMAL HIGH (ref 65–99)

## 2016-07-26 LAB — BASIC METABOLIC PANEL
ANION GAP: 13 (ref 5–15)
BUN: 91 mg/dL — ABNORMAL HIGH (ref 6–20)
CALCIUM: 9 mg/dL (ref 8.9–10.3)
CO2: 19 mmol/L — AB (ref 22–32)
Chloride: 103 mmol/L (ref 101–111)
Creatinine, Ser: 12.17 mg/dL — ABNORMAL HIGH (ref 0.61–1.24)
GFR, EST AFRICAN AMERICAN: 4 mL/min — AB (ref >60)
GFR, EST NON AFRICAN AMERICAN: 4 mL/min — AB (ref >60)
Glucose, Bld: 94 mg/dL (ref 65–99)
Potassium: 7 mmol/L (ref 3.5–5.1)
SODIUM: 135 mmol/L (ref 135–145)

## 2016-07-26 LAB — LIPID PANEL
CHOL/HDL RATIO: 2.9 ratio
CHOLESTEROL: 131 mg/dL (ref 0–200)
HDL: 45 mg/dL (ref >40)
LDL CALC: 47 mg/dL (ref 0–99)
TRIGLYCERIDES: 196 mg/dL — AB (ref <150)
VLDL: 39 mg/dL (ref 0–40)

## 2016-07-26 LAB — INFLUENZA PANEL BY PCR (TYPE A & B)
INFLBPCR: NEGATIVE
Influenza A By PCR: NEGATIVE

## 2016-07-26 LAB — TROPONIN I
TROPONIN I: 2.06 ng/mL — AB (ref <0.03)
TROPONIN I: 1.74 ng/mL — AB (ref <0.03)
Troponin I: 2.3 ng/mL (ref <0.03)
Troponin I: 1.82 ng/mL (ref <0.03)

## 2016-07-26 LAB — PROTIME-INR
INR: 1.14
Prothrombin Time: 14.7 seconds (ref 11.4–15.2)

## 2016-07-26 LAB — APTT: APTT: 35 s (ref 24–36)

## 2016-07-26 LAB — HEPARIN LEVEL (UNFRACTIONATED): Heparin Unfractionated: 0.25 IU/mL — ABNORMAL LOW (ref 0.30–0.70)

## 2016-07-26 LAB — LACTIC ACID, PLASMA: Lactic Acid, Venous: 1.5 mmol/L (ref 0.5–1.9)

## 2016-07-26 MED ORDER — DOCUSATE SODIUM 100 MG PO CAPS: 100.0000 mg | ORAL_CAPSULE | Freq: Two times a day (BID) | ORAL | Status: DC | PRN

## 2016-07-26 MED ORDER — SEVELAMER CARBONATE 800 MG PO TABS
800.0000 mg | ORAL_TABLET | Freq: Every day | ORAL | Status: DC
  Administered 2016-07-28 – 2016-07-30 (×3): 800 mg via ORAL
  Filled 2016-07-26 (×4): qty 1

## 2016-07-26 MED ORDER — INSULIN ASPART 100 UNIT/ML ~~LOC~~ SOLN
0.0000 [IU] | Freq: Three times a day (TID) | SUBCUTANEOUS | Status: DC
  Administered 2016-07-27 – 2016-07-29 (×2): 1 [IU] via SUBCUTANEOUS
  Filled 2016-07-26 (×2): qty 1

## 2016-07-26 MED ORDER — ACETAMINOPHEN 325 MG PO TABS
650.0000 mg | ORAL_TABLET | Freq: Once | ORAL | Status: AC
  Administered 2016-07-26: 650 mg via ORAL
  Filled 2016-07-26: qty 2

## 2016-07-26 MED ORDER — FLUTICASONE PROPIONATE 50 MCG/ACT NA SUSP
1.0000 | Freq: Every day | NASAL | Status: DC
  Administered 2016-07-26 – 2016-07-29 (×4): 1 via NASAL
  Filled 2016-07-26: qty 16

## 2016-07-26 MED ORDER — VANCOMYCIN HCL IN DEXTROSE 1-5 GM/200ML-% IV SOLN
1000.0000 mg | INTRAVENOUS | Status: DC
  Administered 2016-07-26: 1000 mg via INTRAVENOUS
  Filled 2016-07-26: qty 200

## 2016-07-26 MED ORDER — SODIUM CHLORIDE 0.9 % IV SOLN
1.0000 g | Freq: Once | INTRAVENOUS | Status: DC
  Filled 2016-07-26: qty 10

## 2016-07-26 MED ORDER — OXYCODONE HCL 5 MG PO TABS
5.0000 mg | ORAL_TABLET | Freq: Three times a day (TID) | ORAL | Status: DC | PRN
  Administered 2016-07-29: 5 mg via ORAL
  Filled 2016-07-26: qty 1

## 2016-07-26 MED ORDER — ACETAMINOPHEN 650 MG RE SUPP
650.0000 mg | Freq: Four times a day (QID) | RECTAL | Status: DC | PRN
  Administered 2016-07-26 – 2016-07-27 (×2): 650 mg via RECTAL
  Filled 2016-07-26 (×2): qty 1

## 2016-07-26 MED ORDER — RENA-VITE PO TABS
1.0000 | ORAL_TABLET | Freq: Every day | ORAL | Status: DC
  Administered 2016-07-26 – 2016-07-30 (×5): 1 via ORAL
  Filled 2016-07-26 (×5): qty 1

## 2016-07-26 MED ORDER — HEPARIN (PORCINE) IN NACL 100-0.45 UNIT/ML-% IJ SOLN
1100.0000 [IU]/h | INTRAMUSCULAR | Status: DC
  Administered 2016-07-26: 950 [IU]/h via INTRAVENOUS
  Filled 2016-07-26 (×2): qty 250

## 2016-07-26 MED ORDER — SODIUM CHLORIDE 0.9% FLUSH
3.0000 mL | Freq: Two times a day (BID) | INTRAVENOUS | Status: DC
  Administered 2016-07-26 – 2016-07-31 (×9): 3 mL via INTRAVENOUS

## 2016-07-26 MED ORDER — SIMVASTATIN 20 MG PO TABS
20.0000 mg | ORAL_TABLET | Freq: Every day | ORAL | Status: DC
  Administered 2016-07-26 – 2016-07-29 (×4): 20 mg via ORAL
  Filled 2016-07-26 (×4): qty 1

## 2016-07-26 MED ORDER — IPRATROPIUM-ALBUTEROL 0.5-2.5 (3) MG/3ML IN SOLN: 3.0000 mL | RESPIRATORY_TRACT | Status: DC | PRN

## 2016-07-26 MED ORDER — PANTOPRAZOLE SODIUM 40 MG PO TBEC
40.0000 mg | DELAYED_RELEASE_TABLET | Freq: Every day | ORAL | Status: DC
  Administered 2016-07-27 – 2016-07-29 (×3): 40 mg via ORAL
  Filled 2016-07-26 (×3): qty 1

## 2016-07-26 MED ORDER — ASPIRIN 325 MG PO TABS
325.0000 mg | ORAL_TABLET | ORAL | Status: DC
  Administered 2016-07-27 – 2016-07-31 (×5): 325 mg via ORAL
  Filled 2016-07-26 (×5): qty 1

## 2016-07-26 MED ORDER — MOMETASONE FURO-FORMOTEROL FUM 200-5 MCG/ACT IN AERO
2.0000 | INHALATION_SPRAY | Freq: Two times a day (BID) | RESPIRATORY_TRACT | Status: DC
  Administered 2016-07-26 – 2016-07-30 (×9): 2 via RESPIRATORY_TRACT
  Filled 2016-07-26: qty 8.8

## 2016-07-26 MED ORDER — MIDODRINE HCL 5 MG PO TABS
5.0000 mg | ORAL_TABLET | ORAL | Status: DC
Start: 2016-07-26 — End: 2016-07-31
  Administered 2016-07-29: 5 mg via ORAL
  Filled 2016-07-26: qty 1

## 2016-07-26 MED ORDER — POLYETHYLENE GLYCOL 3350 17 G PO PACK
17.0000 g | PACK | ORAL | Status: DC
  Filled 2016-07-26: qty 1

## 2016-07-26 MED ORDER — TIOTROPIUM BROMIDE MONOHYDRATE 18 MCG IN CAPS
18.0000 ug | ORAL_CAPSULE | Freq: Every day | RESPIRATORY_TRACT | Status: DC
  Administered 2016-07-26 – 2016-07-30 (×4): 18 ug via RESPIRATORY_TRACT
  Filled 2016-07-26: qty 5

## 2016-07-26 MED ORDER — VANCOMYCIN HCL IN DEXTROSE 1-5 GM/200ML-% IV SOLN
1000.0000 mg | Freq: Once | INTRAVENOUS | Status: AC
  Administered 2016-07-26: 1000 mg via INTRAVENOUS
  Filled 2016-07-26: qty 200

## 2016-07-26 MED ORDER — ADULT MULTIVITAMIN W/MINERALS CH
1.0000 | ORAL_TABLET | Freq: Every day | ORAL | Status: DC
  Administered 2016-07-26 – 2016-07-29 (×4): 1 via ORAL
  Filled 2016-07-26 (×4): qty 1

## 2016-07-26 MED ORDER — SODIUM BICARBONATE 8.4 % IV SOLN
50.0000 meq | Freq: Once | INTRAVENOUS | Status: AC
  Administered 2016-07-26: 50 meq via INTRAVENOUS
  Filled 2016-07-26: qty 50

## 2016-07-26 MED ORDER — PIPERACILLIN-TAZOBACTAM 3.375 G IVPB
3.3750 g | Freq: Two times a day (BID) | INTRAVENOUS | Status: DC
  Administered 2016-07-26 – 2016-07-27 (×3): 3.375 g via INTRAVENOUS
  Filled 2016-07-26 (×3): qty 50

## 2016-07-26 MED ORDER — PIPERACILLIN-TAZOBACTAM 3.375 G IVPB 30 MIN
3.3750 g | Freq: Once | INTRAVENOUS | Status: AC
  Administered 2016-07-26: 3.375 g via INTRAVENOUS
  Filled 2016-07-26: qty 50

## 2016-07-26 MED ORDER — HEPARIN BOLUS VIA INFUSION
1150.0000 [IU] | Freq: Once | INTRAVENOUS | Status: AC
  Administered 2016-07-26: 1150 [IU] via INTRAVENOUS
  Filled 2016-07-26: qty 1150

## 2016-07-26 MED ORDER — INSULIN ASPART 100 UNIT/ML ~~LOC~~ SOLN
10.0000 [IU] | Freq: Once | SUBCUTANEOUS | Status: AC
  Administered 2016-07-26: 10 [IU] via INTRAVENOUS
  Filled 2016-07-26: qty 10

## 2016-07-26 MED ORDER — CALCIUM ACETATE 667 MG PO CAPS
667.0000 mg | ORAL_CAPSULE | Freq: Three times a day (TID) | ORAL | Status: DC
Start: 2016-07-27 — End: 2016-07-31
  Administered 2016-07-27 – 2016-07-30 (×8): 667 mg via ORAL
  Filled 2016-07-26 (×22): qty 1

## 2016-07-26 MED ORDER — HEPARIN BOLUS VIA INFUSION
4000.0000 [IU] | Freq: Once | INTRAVENOUS | Status: AC
  Administered 2016-07-26: 4000 [IU] via INTRAVENOUS
  Filled 2016-07-26: qty 4000

## 2016-07-26 MED ORDER — CALCIUM GLUCONATE 10 % IV SOLN
1.0000 g | Freq: Once | INTRAVENOUS | Status: AC
  Administered 2016-07-26: 1 g via INTRAVENOUS

## 2016-07-26 MED ORDER — DEXTROSE 50 % IV SOLN
50.0000 mL | Freq: Once | INTRAVENOUS | Status: AC
  Administered 2016-07-26: 50 mL via INTRAVENOUS
  Filled 2016-07-26: qty 50

## 2016-07-26 NOTE — Progress Notes (Signed)
PRE DIALYSIS ASSESSMENT 

## 2016-07-26 NOTE — ED Provider Notes (Signed)
[facility] Emergency Department Provider Note   First MD Initiated Contact with Patient 07/26/16 [phone]     (approximate)  I have reviewed the triage vital signs and the nursing notes.   HISTORY  Chief Complaint No chief complaint on file.    HPI [name] is a 61 y.o. male below list of chronic medical conditions presents to the emergency department EMS with history of chest pain yesterday no chest pain at present. EMS stated that the patient were called by the dialysis center secondary to patient bleeding from recently placed dialysis catheter and altered mental status. Patient denies any pain at present no dyspnea does admit to pain on the right side of his neck at site of dialysis catheter   Past Medical History:  Diagnosis Date  . Anemia   . Asthma   . CHF (congestive heart failure) ([place])   . Chronic systolic heart failure ([place])   . Complication of anesthesia    hypotension  . COPD (chronic obstructive pulmonary disease) ([place])   . Coronary artery disease   . Dialysis patient ([place])    Mon, Wed, Fri  . End stage renal disease ([place])   . GERD (gastroesophageal reflux disease)   . Headache   . History of kidney stones   . HLD (hyperlipidemia)   . HTN (hypertension)   . Hyperparathyroidism   . Myocardial infarction   . Peripheral vascular disease ([place])   . Shortness of breath dyspnea   . Sleep apnea   . Tobacco dependence     Patient Active Problem List   Diagnosis Date Noted  . Acute respiratory failure with hypoxia ([place]) 05/24/2016  . Right lower lobe pneumonia ([place]) 05/24/2016  . Elevated troponin 05/24/2016  . Pulmonary edema 05/21/2016  . Kidney dialysis as the cause of abnormal reaction of the patient, or of later complication, without mention of misadventure at the time of the procedure (CODE) 03/05/2016  . PVD (peripheral vascular disease) ([place]) 03/05/2016  . Preoperative cardiovascular examination 05/02/2015  . Chronic  systolic heart failure ([place])   . Sepsis due to cellulitis ([place]) 01/13/2015  . Acute encephalopathy 01/13/2015  . Hyperkalemia 01/13/2015  . Gangrene of foot ([place]) 05/04/2014  . ESRD (end stage renal disease) on dialysis ([place]) 05/04/2014  . CAD (coronary artery disease) 05/04/2014  . Normocytic anemia 05/04/2014  . Hyperlipidemia 04/26/2010  . HYPERTENSION, BENIGN 04/26/2010  . CAD, NATIVE VESSEL 04/26/2010  . End stage renal disease ([place]) 04/26/2010    Past Surgical History:  Procedure Laterality Date  . AMPUTATION Left 05/06/2014   Procedure: AMPUTATION BELOW KNEE;  Surgeon: Elam Dutch, MD;  Location: [place];  Service: Vascular;  Laterality: Left;  . AMPUTATION Right 01/12/2015   Procedure: Foot transmetatarsal amputation;  Surgeon: Algernon Huxley, MD;  Location: [facility];  Service: Vascular;  Laterality: Right;  . APPLICATION OF WOUND VAC Right 03/01/2015   Procedure: Application of Bio-connekt graft and wound vac application to right foot ;  Surgeon: Algernon Huxley, MD;  Location: [facility];  Service: Vascular;  Laterality: Right;  . AV FISTULA PLACEMENT Left   . CARDIAC CATHETERIZATION     stent placement   . CORONARY ANGIOPLASTY    . LIGATION OF ARTERIOVENOUS  FISTULA Right 01/31/2016   Procedure: LIGATION OF ARTERIOVENOUS  FISTULA;  Surgeon: Algernon Huxley, MD;  Location: [facility];  Service: Vascular;  Laterality: Right;  . PERIPHERAL VASCULAR CATHETERIZATION Right 12/15/2014   Procedure: Lower Extremity Angiography;  Surgeon: Algernon Huxley, MD;  Location: [place] [facility];  Service: Cardiovascular;  Laterality: Right;  . PERIPHERAL VASCULAR CATHETERIZATION  12/15/2014   Procedure: Lower Extremity Intervention;  Surgeon: Algernon Huxley, MD;  Location: [place] [facility];  Service: Cardiovascular;;  . PERIPHERAL VASCULAR CATHETERIZATION Right 08/14/2015   Procedure: A/V Shuntogram/Fistulagram;  Surgeon: Algernon Huxley, MD;  Location: [place] [facility];  Service: Cardiovascular;   Laterality: Right;  . PERIPHERAL VASCULAR CATHETERIZATION N/A 08/14/2015   Procedure: A/V Shunt Intervention;  Surgeon: Algernon Huxley, MD;  Location: [place] [facility];  Service: Cardiovascular;  Laterality: N/A;  . PERIPHERAL VASCULAR CATHETERIZATION N/A 01/11/2016   Procedure: Dialysis/Perma Catheter Insertion;  Surgeon: Algernon Huxley, MD;  Location: [place] [facility];  Service: Cardiovascular;  Laterality: N/A;  . REVISON OF ARTERIOVENOUS FISTULA Right 02/17/2016   Procedure: removal of AV fistula;  Surgeon: Serafina Mitchell, MD;  Location: [facility];  Service: Vascular;  Laterality: Right;  . REVISON OF ARTERIOVENOUS FISTULA Right 01/31/2016   Procedure: REVISON OF ARTERIOVENOUS FISTULA ( BRACHIOCEPHALIC ) W/ ARTEGRAFT;  Surgeon: Algernon Huxley, MD;  Location: [facility];  Service: Vascular;  Laterality: Right;  . TRANSMETATARSAL AMPUTATION Right 05/04/2015   Procedure: TRANSMETATARSAL AMPUTATION REVISION, great toe amputation;  Surgeon: Algernon Huxley, MD;  Location: [facility];  Service: Vascular;  Laterality: Right;    Prior to Admission medications   Medication Sig Start Date End Date Taking? Authorizing Provider  acetaminophen (TYLENOL) 500 MG tablet Take 1,000 mg by mouth every 6 (six) hours as needed for moderate pain.    Historical Provider, MD  albuterol (PROVENTIL HFA;VENTOLIN HFA) 108 (90 BASE) MCG/ACT inhaler Inhale 2 puffs into the lungs every 6 (six) hours as needed for wheezing or shortness of breath.     Historical Provider, MD  aspirin 325 MG tablet Take 325 mg by mouth every morning.     Historical Provider, MD  atorvastatin (LIPITOR) 10 MG tablet Take 10 mg by mouth at bedtime.     Historical Provider, MD  budesonide-formoterol (SYMBICORT) 160-4.5 MCG/ACT inhaler Inhale 2 puffs into the lungs 2 (two) times daily. Reported on 08/14/2015    Historical Provider, MD  calcium acetate (PHOSLO) 667 MG capsule Take 667 mg by mouth 3 (three) times daily with meals.    Historical Provider, MD    Darbepoetin Alfa (ARANESP) 40 MCG/0.4ML SOSY injection Inject 0.4 mLs (40 mcg total) into the vein every Monday with hemodialysis. 05/16/14   Shanker Kristeen Mans, MD  docusate sodium (COLACE) 100 MG capsule Take 100 mg by mouth 2 (two) times daily as needed. Reported on 08/14/2015    Historical Provider, MD  doxercalciferol (HECTOROL) 4 MCG/2ML injection Inject 1.25 mLs (2.5 mcg total) into the vein every Monday, Wednesday, and Friday with hemodialysis. 05/10/14   Shanker Kristeen Mans, MD  ferric gluconate 125 mg in sodium chloride 0.9 % 100 mL Inject 125 mg into the vein every Monday, Wednesday, and Friday with hemodialysis. 05/10/14   Shanker Kristeen Mans, MD  fluticasone (FLONASE) 50 MCG/ACT nasal spray Place 1 spray into both nostrils daily.    Historical Provider, MD  ipratropium-albuterol (DUONEB) 0.5-2.5 (3) MG/3ML SOLN Take 3 mLs by nebulization every 4 (four) hours as needed (SOB).    Historical Provider, MD  Layla Barter 15 GM/60ML suspension Take 60 mLs by mouth once. 05/01/16   Historical Provider, MD  metoprolol tartrate (LOPRESSOR) 25 MG tablet Take 25 mg by mouth 2 (two) times daily.  Historical Provider, MD  midodrine (PROAMATINE) 5 MG tablet Take 5 mg by mouth. 3 times a week, 30 minutes before dialysis    Historical Provider, MD  Multiple Vitamins-Minerals (MULTIVITAMIN WITH MINERALS) tablet Take 1 tablet by mouth daily.    Historical Provider, MD  multivitamin (RENA-VIT) TABS tablet Take 1 tablet by mouth at bedtime. 05/10/14   Shanker Kristeen Mans, MD  Nutritional Supplements (FEEDING SUPPLEMENT, NEPRO CARB STEADY,) LIQD Take 237 mLs by mouth daily.    Historical Provider, MD  OxyCODONE HCl, Abuse Deter, (OXAYDO) 5 MG TABA Take 1 tablet by mouth every 8 (eight) hours as needed. 05/24/16   Theodoro Grist, MD  oxyCODONE-acetaminophen (PERCOCET) 7.5-325 MG tablet Take 1 tablet by mouth every 4 (four) hours as needed for severe pain. 05/24/16   Theodoro Grist, MD  pantoprazole (PROTONIX) 20 MG tablet  Take 20 mg by mouth daily.    Historical Provider, MD  polyethylene glycol (MIRALAX / GLYCOLAX) packet Take 17 g by mouth every morning. Hold for loose stools.    Historical Provider, MD  pregabalin (LYRICA) 75 MG capsule Take 75 mg by mouth 2 (two) times daily.    Historical Provider, MD  sevelamer carbonate (RENVELA) 800 MG tablet Take 800 mg by mouth daily.     Historical Provider, MD  tiotropium (SPIRIVA) 18 MCG inhalation capsule Place 18 mcg into inhaler and inhale daily.    Historical Provider, MD    Allergies Dust mite extract  Family History  Problem Relation Age of Onset  . Heart failure Other   . Hypertension Other   . Leukemia Other   . Diabetes Other     Social History Social History  Substance Use Topics  . Smoking status: Light Tobacco Smoker    Packs/day: 0.25    Types: Cigarettes  . Smokeless tobacco: Never Used     Comment: 5  . Alcohol use No    Review of Systems Constitutional: No fever/chills Eyes: No visual changes. ENT: No sore throat. Cardiovascular: Denies chest pain. Respiratory: Denies shortness of breath. Gastrointestinal: No abdominal pain.  No nausea, no vomiting.  No diarrhea.  No constipation. Genitourinary: Negative for dysuria. Musculoskeletal: Negative for back pain. Skin: Negative for rash. Neurological: Negative for headaches, focal weakness or numbness. Endocrine: Hematological/Lymphatic:Positive for bleeding from dialysis catheter  10-point ROS otherwise negative.  ____________________________________________   PHYSICAL EXAM:  VITAL SIGNS: ED Triage Vitals  Enc Vitals Group     BP      Pulse      Resp      Temp      Temp src      SpO2      Weight      Height      Head Circumference      Peak Flow      Pain Score      Pain Loc      Pain Edu?      Excl. in [place]?     Constitutional: Alert and oriented. Well appearing and in no acute distress. Eyes: Conjunctivae are normal. PERRL. EOMI. Head: Atraumatic. Nose:  No congestion/rhinnorhea. Mouth/Throat: Mucous membranes are moist.  Oropharynx non-erythematous. Neck: No stridor.  No meningeal signs.  No cervical spine tenderness to palpation.Palpation of the right side of the neck Cardiovascular: Normal rate, regular rhythm. Good peripheral circulation. Grossly normal heart sounds. Very scant bleeding noted right chest dialysis catheter Respiratory: Normal respiratory effort.  No retractions. Lungs CTAB. Gastrointestinal: Soft and nontender. No distention.  Musculoskeletal: No lower extremity tenderness nor edema. No gross deformities of extremities. Neurologic:  Normal speech and language. No gross focal neurologic deficits are appreciated.  Skin:  Skin is warm, dry and intact. No rash noted. Psychiatric: Mood and affect are normal. Speech and behavior are normal.  ____________________________________________   LABS (all labs ordered are listed, but only abnormal results are displayed)  Labs Reviewed  BASIC METABOLIC PANEL  CBC  TROPONIN I   ____________________________________________  EKG  ED ECG REPORT I, Hayden, the attending physician, personally viewed and interpreted this ECG.   Date: 07/26/2016  EKG Time: 6:41 AM  Rate: 94  Rhythm: Normal sinus rhythm with LVH. Lateral T wave inversion 1 aVL V5 V6 consistent with EKG from 12 2017.  Axis: Normal  Intervals: Normal  ST&T Change: None  ____________________________________________  RADIOLOGY I, Norman N Addelynn Batte, personally viewed and evaluated these images (plain radiographs) as part of my medical decision making, as well as reviewing the written report by the radiologist.  X-ray Chest Pa Or Ap  Result Date: 07/25/2016 CLINICAL DATA:  Preop vascular procedure EXAM: CHEST 1 VIEW COMPARISON:  05/21/2016 FINDINGS: Trace right pleural effusion. Bilateral mild interstitial thickening. No pneumothorax. No focal consolidation. Stable cardiomegaly. Dual-lumen right-sided  central venous catheter in unchanged position. No acute osseous abnormality. IMPRESSION: Cardiomegaly with mild pulmonary vascular congestion. Electronically Signed   By: Kathreen Devoid   On: 07/25/2016 13:36      Procedures   Critical Care performed:CRITICAL CARE Performed by: Gregor Hams   Total critical care time: 30 minutes  Critical care time was exclusive of separately billable procedures and treating other patients.  Critical care was necessary to treat or prevent imminent or life-threatening deterioration.  Critical care was time spent personally by me on the following activities: development of treatment plan with patient and/or surrogate as well as nursing, discussions with consultants, evaluation of patient's response to treatment, examination of patient, obtaining history from patient or surrogate, ordering and performing treatments and interventions, ordering and review of laboratory studies, ordering and review of radiographic studies, pulse oximetry and re-evaluation of patient's condition.  ____________________________________________   INITIAL IMPRESSION / ASSESSMENT AND PLAN / ED COURSE  Pertinent labs & imaging results that were available during my care of the patient were reviewed by me and considered in my medical decision making (see chart for details).  Patient initial EKG on presentation to the emergency department not consistent with STEMI EKGs reviewed from EMS also not consistent with STEMI. Dr. Tomasita Crumble shows cardiologist presented to the emergency department and evaluated the EKGs and the patient and concluded that this was not a STEMI. Patient has no chest pain or shortness of breath at this time.    patient's care transferred to Dr. Dineen Kid awaiting laboratory data chest x-ray.  ____________________________________________  FINAL CLINICAL IMPRESSION(S) / ED DIAGNOSES  Final diagnoses:  Neck pain  Bleeding due to dialysis catheter placement,  initial encounter ([place])     MEDICATIONS GIVEN DURING THIS VISIT:  Medications - No data to display   NEW OUTPATIENT MEDICATIONS STARTED DURING THIS VISIT:  New Prescriptions   No medications on file    Modified Medications   No medications on file    Discontinued Medications   No medications on file     Note:  This document was prepared using Dragon voice recognition software and may include unintentional dictation errors.    Gregor Hams, MD 07/26/16 [phone]

## 2016-07-26 NOTE — Progress Notes (Signed)
[facility]  ROUNDING NOTE   Subjective:   [name] admitted to [facility] on 07/26/2016 for Chest Pain  Patient went for permcath placement by Dr. Lucky Cowboy yesterday as his AVF right arm is not functioning. Patient returns with fever and hyperkalemia.   Last hemodialysis Wednesday  Family at bedside.   Objective:  Vital signs in last 24 hours:  Temp:  [98.6 F (37 C)-103.3 F (39.6 C)] 103.3 F (39.6 C) (03/02 0811) Pulse Rate:  [84-109] 102 (03/02 0930) Resp:  [16-22] 19 (03/02 0930) BP: (108-140)/(78-98) 108/78 (03/02 0930) SpO2:  [96 %-100 %] 98 % (03/02 0930) Weight:  [80.3 kg (177 lb)] 80.3 kg (177 lb) (03/01 1317)  Weight change:  There were no vitals filed for this visit.  Intake/Output: No intake/output data recorded.   Intake/Output this shift:  No intake/output data recorded.  Physical Exam: General: Ill appearing.   Head: Normocephalic, atraumatic. Moist oral mucosal membranes  Eyes: Anicteric, PERRL  Neck: Supple, trachea midline  Lungs:  Clear to auscultation  Heart: Regular rate and rhythm  Abdomen:  Soft, nontender  Extremities: no peripheral edema.  Neurologic: Nonfocal, moving all four extremities  Skin: No lesions  Access: Right AVF fistula, RIJ permcath 3/1 Dr. Lucky Cowboy    Basic Metabolic Panel:  Recent Labs Lab 07/25/16 1306 07/25/16 1359 07/26/16 0645  NA 137 137 135  K 5.6* 5.8* 7.0*  CL 100*  --  103  CO2 23  --  19*  GLUCOSE 95 91 94  BUN 70*  --  91*  CREATININE 10.31*  --  12.17*  CALCIUM 9.4  --  9.0    Liver Function Tests: No results for input(s): AST, ALT, ALKPHOS, BILITOT, PROT, ALBUMIN in the last 168 hours. No results for input(s): LIPASE, AMYLASE in the last 168 hours. No results for input(s): AMMONIA in the last 168 hours.  CBC:  Recent Labs Lab 07/25/16 1306 07/25/16 1359 07/26/16 0645  WBC 12.5*  --  15.0*  NEUTROABS 6.0  --   --   HGB 12.8* 12.9* 12.3*  HCT 38.1* 38.0* 36.6*  MCV 93.1  --   91.9  PLT 119*  --  117*    Cardiac Enzymes:  Recent Labs Lab 07/26/16 0645  TROPONINI 2.06*    BNP: Invalid input(s): POCBNP  CBG: No results for input(s): GLUCAP in the last 168 hours.  Microbiology: Results for orders placed or performed during the hospital encounter of 07/25/16  Surgical pcr screen     Status: None   Collection Time: 07/25/16  1:03 PM  Result Value Ref Range Status   MRSA, PCR NEGATIVE NEGATIVE Final   Staphylococcus aureus NEGATIVE NEGATIVE Final    Comment:        The Xpert SA Assay (FDA approved for NASAL specimens in patients over 63 years of age), is one component of a comprehensive surveillance program.  Test performance has been validated by [facility] for patients greater than or equal to 70 year old. It is not intended to diagnose infection nor to guide or monitor treatment.     Coagulation Studies:  Recent Labs  07/25/16 1306 07/26/16 0805  LABPROT 14.7 14.7  INR 1.14 1.14    Urinalysis: No results for input(s): COLORURINE, LABSPEC, PHURINE, GLUCOSEU, HGBUR, BILIRUBINUR, KETONESUR, PROTEINUR, UROBILINOGEN, NITRITE, LEUKOCYTESUR in the last 72 hours.  Invalid input(s): APPERANCEUR    Imaging: X-ray Chest Pa Or Ap  Result Date: 07/25/2016 CLINICAL DATA:  Preop vascular procedure EXAM: CHEST  1 VIEW COMPARISON:  05/21/2016 FINDINGS: Trace right pleural effusion. Bilateral mild interstitial thickening. No pneumothorax. No focal consolidation. Stable cardiomegaly. Dual-lumen right-sided central venous catheter in unchanged position. No acute osseous abnormality. IMPRESSION: Cardiomegaly with mild pulmonary vascular congestion. Electronically Signed   By: Kathreen Devoid   On: 07/25/2016 13:36   Dg Chest Port 1 View  Result Date: 07/26/2016 CLINICAL DATA:  Chest pain.  Dialysis patient. EXAM: PORTABLE CHEST 1 VIEW COMPARISON:  07/25/2016 FINDINGS: Central venous catheter tip remains in the lower SVC unchanged. Small right pleural  effusion unchanged. Mild bibasilar atelectasis unchanged. Decreased lung volume. Negative for edema. IMPRESSION: Small right pleural effusion and mild right lower lobe atelectasis unchanged. Hypoventilation with decreased lung volume. Electronically Signed   By: Franchot Gallo M.D.   On: 07/26/2016 07:17     Medications:   . calcium gluconate 1 g (07/26/16 1003)  . heparin 950 Units/hr (07/26/16 0821)   . insulin aspart  0-9 Units Subcutaneous TID WC     Assessment/ Plan:  [name] is a 61 y.o. black male [name] is a 61 y.o. black male with ESRD on hemodialysis, hypertension, hyperlipidemia, anemia, COPD, peripheral vascular disease, left BKA, right toe amputations  MWF [facility] Nephrology [facility].   1.  End stage renal disease with hyperkalemia. Will require hemodialysis. Orders prepared. 2K bath.  Complication of dialysis device. Using permcath currently, placed yesterday.   - Resume MWF schedule   2.  Anemia of chronic kidney disease: hemoglobin 12.3 - EPO as outpatient.   3. Hypertension: blood pressure at goal.  - midodrine usually given before dialysis.   4. Secondary Hyperparathyroidism: - calcium acetate with meals.   5. Sepsis: concerning with leukocytosis and fever.  Empiric antibiotics. Vancomycin and zosyn given in the ED.   6. Chest pain: appreciate cards input. On heparin gtt.    LOS: 0 Shankar Silber 3/2/201810:30 AM

## 2016-07-26 NOTE — Progress Notes (Signed)
PHARMACY - PHYSICIAN COMMUNICATION CRITICAL VALUE ALERT - BLOOD CULTURE IDENTIFICATION (BCID)  Results for orders placed or performed during the hospital encounter of 07/26/16  Blood Culture ID Panel (Reflexed) (Collected: 07/26/2016  9:57 AM)  Result Value Ref Range   Enterococcus species NOT DETECTED NOT DETECTED   Listeria monocytogenes NOT DETECTED NOT DETECTED   Staphylococcus species DETECTED (A) NOT DETECTED   Staphylococcus aureus DETECTED (A) NOT DETECTED   Methicillin resistance DETECTED (A) NOT DETECTED   Streptococcus species NOT DETECTED NOT DETECTED   Streptococcus agalactiae NOT DETECTED NOT DETECTED   Streptococcus pneumoniae NOT DETECTED NOT DETECTED   Streptococcus pyogenes NOT DETECTED NOT DETECTED   Acinetobacter baumannii NOT DETECTED NOT DETECTED   Enterobacteriaceae species NOT DETECTED NOT DETECTED   Enterobacter cloacae complex NOT DETECTED NOT DETECTED   Escherichia coli NOT DETECTED NOT DETECTED   Klebsiella oxytoca NOT DETECTED NOT DETECTED   Klebsiella pneumoniae NOT DETECTED NOT DETECTED   Proteus species NOT DETECTED NOT DETECTED   Serratia marcescens NOT DETECTED NOT DETECTED   Haemophilus influenzae NOT DETECTED NOT DETECTED   Neisseria meningitidis NOT DETECTED NOT DETECTED   Pseudomonas aeruginosa NOT DETECTED NOT DETECTED   Candida albicans NOT DETECTED NOT DETECTED   Candida glabrata NOT DETECTED NOT DETECTED   Candida krusei NOT DETECTED NOT DETECTED   Candida parapsilosis NOT DETECTED NOT DETECTED   Candida tropicalis NOT DETECTED NOT DETECTED   Lab reports GPC in aerobic bottle of 1 set (1 of 4 bottles) staph sp, staph aureus, MecA +. Patient is on vanc and Zosyn for sepsis.  Name of physician (or Provider) Contacted: Dr. Ara Kussmaul  Changes to prescribed antibiotics required: None at this time  Laural Benes, Pharm.D., BCPS Clinical Pharmacist 07/26/2016  11:28 PM

## 2016-07-26 NOTE — ED Notes (Signed)
Per acems: pt from dialysis prior to infusion d/t AMS upon arrival to dialysis from [facility]. Bleeding present from dialysis port for unknown time. CP since yesterday. VSS. Stemi on monitor in route. CBG 126. #20 Left AC. Hr 97, 130/81 BP, 4LO2 [place] 94%. Hx copd, cad, chf.

## 2016-07-26 NOTE — H&P (Signed)
[facility] [place] NAME: [name]    MR#:  [id number]  DATE OF BIRTH:  [birth year]  DATE OF ADMISSION:  07/26/2016  PRIMARY CARE PHYSICIAN: [place]   REQUESTING/REFERRING PHYSICIAN:   CHIEF COMPLAINT:   Chief Complaint  Patient presents with  . Chest Pain  . Vascular Access Problem  . Neck Pain    HISTORY OF PRESENT ILLNESS: [name]  is a 61 y.o. male with a known history of CHF with ejection fraction 20%, COPD, coronary artery disease status post and, end-stage renal disease on hemodialysis on Monday Wednesday and Friday, hyperlipidemia, hypertension, diabetes- had a vascular procedure of changing his permacath done yesterday. He lives in a facility and goes for his regular dialysis from there. He claims to be taking all his medications on time. Today when he went for his dialysis he was feeling very weak and there was also some chest pain so they sent him to emergency room. His EKG was questionable having some ST elevation MI so, it was reviewed by cardiologist on arrival to ER and he suggested it does not look like ST elevation MI and on further check up his potassium was found to be 7, and troponin more than 2. Patient also had high-grade fever in ER but his vitals are stable, he is some lethargic and not very oriented to give clear history. ER physician spoke to cardiologist and nephrologist to have urgent intervention, suggested to start on heparin IV drip and to have emergent hemodialysis done. Patient is not able to give me any history because of change in mental status but his sister is present in the room she told me for last few days patient had on and off complaints of chest pain. She also confirms in any adverse event we should not do CPR or resuscitation on the patient and she agrees for DO NOT RESUSCITATE. She claims to be only close living related for the patient and there is one brother also for the patient,  they both would be making decisions for him in those type of events and they're both in agreement for keeping him DO NOT RESUSCITATE.   PAST MEDICAL HISTORY:   Past Medical History:  Diagnosis Date  . Anemia   . Asthma   . CHF (congestive heart failure) ([place])   . Chronic systolic heart failure ([place])   . Complication of anesthesia    hypotension  . COPD (chronic obstructive pulmonary disease) ([place])   . Coronary artery disease   . Dialysis patient ([place])    Mon, Wed, Fri  . End stage renal disease ([place])   . GERD (gastroesophageal reflux disease)   . Headache   . History of kidney stones   . HLD (hyperlipidemia)   . HTN (hypertension)   . Hyperparathyroidism   . Myocardial infarction   . Peripheral vascular disease ([place])   . Shortness of breath dyspnea   . Sleep apnea   . Tobacco dependence     PAST SURGICAL HISTORY: Past Surgical History:  Procedure Laterality Date  . AMPUTATION Left 05/06/2014   Procedure: AMPUTATION BELOW KNEE;  Surgeon: Elam Dutch, MD;  Location: [place];  Service: Vascular;  Laterality: Left;  . AMPUTATION Right 01/12/2015   Procedure: Foot transmetatarsal amputation;  Surgeon: Algernon Huxley, MD;  Location: [facility];  Service: Vascular;  Laterality: Right;  . APPLICATION OF WOUND VAC Right 03/01/2015   Procedure: Application of Bio-connekt graft and wound  vac application to right foot ;  Surgeon: Algernon Huxley, MD;  Location: [facility];  Service: Vascular;  Laterality: Right;  . AV FISTULA PLACEMENT Left   . CARDIAC CATHETERIZATION     stent placement   . CORONARY ANGIOPLASTY    . LIGATION OF ARTERIOVENOUS  FISTULA Right 01/31/2016   Procedure: LIGATION OF ARTERIOVENOUS  FISTULA;  Surgeon: Algernon Huxley, MD;  Location: [facility];  Service: Vascular;  Laterality: Right;  . PERIPHERAL VASCULAR CATHETERIZATION Right 12/15/2014   Procedure: Lower Extremity Angiography;  Surgeon: Algernon Huxley, MD;  Location: [place] [facility];  Service: Cardiovascular;  Laterality:  Right;  . PERIPHERAL VASCULAR CATHETERIZATION  12/15/2014   Procedure: Lower Extremity Intervention;  Surgeon: Algernon Huxley, MD;  Location: [place] [facility];  Service: Cardiovascular;;  . PERIPHERAL VASCULAR CATHETERIZATION Right 08/14/2015   Procedure: A/V Shuntogram/Fistulagram;  Surgeon: Algernon Huxley, MD;  Location: [place] [facility];  Service: Cardiovascular;  Laterality: Right;  . PERIPHERAL VASCULAR CATHETERIZATION N/A 08/14/2015   Procedure: A/V Shunt Intervention;  Surgeon: Algernon Huxley, MD;  Location: [place] [facility];  Service: Cardiovascular;  Laterality: N/A;  . PERIPHERAL VASCULAR CATHETERIZATION N/A 01/11/2016   Procedure: Dialysis/Perma Catheter Insertion;  Surgeon: Algernon Huxley, MD;  Location: [place] [facility];  Service: Cardiovascular;  Laterality: N/A;  . REVISON OF ARTERIOVENOUS FISTULA Right 02/17/2016   Procedure: removal of AV fistula;  Surgeon: Serafina Mitchell, MD;  Location: [facility];  Service: Vascular;  Laterality: Right;  . REVISON OF ARTERIOVENOUS FISTULA Right 01/31/2016   Procedure: REVISON OF ARTERIOVENOUS FISTULA ( BRACHIOCEPHALIC ) W/ ARTEGRAFT;  Surgeon: Algernon Huxley, MD;  Location: [facility];  Service: Vascular;  Laterality: Right;  . TRANSMETATARSAL AMPUTATION Right 05/04/2015   Procedure: TRANSMETATARSAL AMPUTATION REVISION, great toe amputation;  Surgeon: Algernon Huxley, MD;  Location: [facility];  Service: Vascular;  Laterality: Right;    SOCIAL HISTORY:  Social History  Substance Use Topics  . Smoking status: Light Tobacco Smoker    Packs/day: 0.25    Types: Cigarettes  . Smokeless tobacco: Never Used     Comment: 5  . Alcohol use No    FAMILY HISTORY:  Family History  Problem Relation Age of Onset  . Heart failure Other   . Hypertension Other   . Leukemia Other   . Diabetes Other     DRUG ALLERGIES:  Allergies  Allergen Reactions  . Dust Mite Extract     REVIEW OF SYSTEMS:   Because of altered mental status, patient is not able to give  me review of system.   MEDICATIONS AT HOME:  Prior to Admission medications   Medication Sig Start Date End Date Taking? Authorizing Provider  acetaminophen (TYLENOL) 500 MG tablet Take 1,000 mg by mouth every 6 (six) hours as needed for moderate pain.   Yes Historical Provider, MD  albuterol (PROVENTIL HFA;VENTOLIN HFA) 108 (90 BASE) MCG/ACT inhaler Inhale 2 puffs into the lungs every 6 (six) hours as needed for wheezing or shortness of breath.    Yes Historical Provider, MD  aspirin 325 MG tablet Take 325 mg by mouth every morning.    Yes Historical Provider, MD  atorvastatin (LIPITOR) 10 MG tablet Take 10 mg by mouth at bedtime.    Yes Historical Provider, MD  budesonide-formoterol (SYMBICORT) 160-4.5 MCG/ACT inhaler Inhale 2 puffs into the lungs 2 (two) times daily. Reported on 08/14/2015   Yes Historical Provider, MD  calcium acetate (PHOSLO)  667 MG capsule Take 667 mg by mouth 3 (three) times daily with meals.   Yes Historical Provider, MD  Darbepoetin Alfa (ARANESP) 40 MCG/0.4ML SOSY injection Inject 0.4 mLs (40 mcg total) into the vein every Monday with hemodialysis. 05/16/14  Yes Shanker Kristeen Mans, MD  docusate sodium (COLACE) 100 MG capsule Take 100 mg by mouth 2 (two) times daily as needed. Reported on 08/14/2015   Yes Historical Provider, MD  doxercalciferol (HECTOROL) 4 MCG/2ML injection Inject 1.25 mLs (2.5 mcg total) into the vein every Monday, Wednesday, and Friday with hemodialysis. 05/10/14  Yes Shanker Kristeen Mans, MD  ferric gluconate 125 mg in sodium chloride 0.9 % 100 mL Inject 125 mg into the vein every Monday, Wednesday, and Friday with hemodialysis. 05/10/14  Yes Shanker Kristeen Mans, MD  fluticasone (FLONASE) 50 MCG/ACT nasal spray Place 1 spray into both nostrils daily.   Yes Historical Provider, MD  ipratropium-albuterol (DUONEB) 0.5-2.5 (3) MG/3ML SOLN Take 3 mLs by nebulization every 4 (four) hours as needed (SOB).   Yes Historical Provider, MD  metoprolol tartrate (LOPRESSOR)  25 MG tablet Take 25 mg by mouth 2 (two) times daily.     Yes Historical Provider, MD  midodrine (PROAMATINE) 5 MG tablet Take 5 mg by mouth. 3 times a week, 30 minutes before dialysis   Yes Historical Provider, MD  moxifloxacin (AVELOX) 400 MG tablet Take 400 mg by mouth daily. For pneumonia   Yes Historical Provider, MD  Multiple Vitamins-Minerals (MULTIVITAMIN WITH MINERALS) tablet Take 1 tablet by mouth daily.   Yes Historical Provider, MD  multivitamin (RENA-VIT) TABS tablet Take 1 tablet by mouth at bedtime. 05/10/14  Yes Shanker Kristeen Mans, MD  Nutritional Supplements (FEEDING SUPPLEMENT, NEPRO CARB STEADY,) LIQD Take 237 mLs by mouth daily.   Yes Historical Provider, MD  OxyCODONE HCl, Abuse Deter, (OXAYDO) 5 MG TABA Take 1 tablet by mouth every 8 (eight) hours as needed. 05/24/16  Yes Theodoro Grist, MD  oxyCODONE-acetaminophen (PERCOCET) 7.5-325 MG tablet Take 1 tablet by mouth every 4 (four) hours as needed for severe pain. 05/24/16  Yes Theodoro Grist, MD  pantoprazole (PROTONIX) 20 MG tablet Take 20 mg by mouth daily.   Yes Historical Provider, MD  polyethylene glycol (MIRALAX / GLYCOLAX) packet Take 17 g by mouth every morning. Hold for loose stools.   Yes Historical Provider, MD  pregabalin (LYRICA) 75 MG capsule Take 75 mg by mouth 2 (two) times daily.   Yes Historical Provider, MD  sevelamer carbonate (RENVELA) 800 MG tablet Take 800 mg by mouth daily.    Yes Historical Provider, MD  simvastatin (ZOCOR) 20 MG tablet Take 20 mg by mouth daily.   Yes Historical Provider, MD  tiotropium (SPIRIVA) 18 MCG inhalation capsule Place 18 mcg into inhaler and inhale daily.   Yes Historical Provider, MD  Layla Barter 15 GM/60ML suspension Take 60 mLs by mouth once. 05/01/16   Historical Provider, MD      PHYSICAL EXAMINATION:   VITAL SIGNS: Blood pressure 140/86, pulse (!) 109, temperature (!) 103.3 F (39.6 C), temperature source Oral, resp. rate 19, SpO2 99 %.  GENERAL:  61 y.o.-year-old patient  lying in the bed with no acute distress.  EYES: Pupils equal, round, reactive to light and accommodation. No scleral icterus. Extraocular muscles intact.  HEENT: Head atraumatic, normocephalic. Oropharynx and nasopharynx clear.  NECK:  Supple, no jugular venous distention. No thyroid enlargement, no tenderness.  LUNGS: Normal breath sounds bilaterally, no wheezing, rales,rhonchi or crepitation. No use of  accessory muscles of respiration. Right chest permacath present. CARDIOVASCULAR: S1, S2 Regular with tachycardia, No murmurs.  ABDOMEN: Soft, nontender, nondistended. Bowel sounds present. No organomegaly or mass.  EXTREMITIES: No pedal edema, cyanosis, or clubbing. Left BKA, right toes amputation NEUROLOGIC: Patient is drowsy, on calling he opens eyes and speak a few words but then goes back to sleep, he is not carrying out much conversations.  PSYCHIATRIC: The patient is drowsy.  SKIN: No obvious rash, lesion, or ulcer.   LABORATORY PANEL:   CBC  Recent Labs Lab 07/25/16 1306 07/25/16 1359 07/26/16 0645  WBC 12.5*  --  15.0*  HGB 12.8* 12.9* 12.3*  HCT 38.1* 38.0* 36.6*  PLT 119*  --  117*  MCV 93.1  --  91.9  MCH 31.3  --  30.9  MCHC 33.6  --  33.6  RDW 17.4*  --  17.0*  LYMPHSABS 5.1*  --   --   MONOABS 0.9  --   --   EOSABS 0.5  --   --   BASOSABS 0.0  --   --    ------------------------------------------------------------------------------------------------------------------  Chemistries   Recent Labs Lab 07/25/16 1306 07/25/16 1359 07/26/16 0645  NA 137 137 135  K 5.6* 5.8* 7.0*  CL 100*  --  103  CO2 23  --  19*  GLUCOSE 95 91 94  BUN 70*  --  91*  CREATININE 10.31*  --  12.17*  CALCIUM 9.4  --  9.0   ------------------------------------------------------------------------------------------------------------------ estimated creatinine clearance is 7.1 mL/min (by C-G formula based on SCr of 12.17 mg/dL  (H)). ------------------------------------------------------------------------------------------------------------------ No results for input(s): TSH, T4TOTAL, T3FREE, THYROIDAB in the last 72 hours.  Invalid input(s): FREET3   Coagulation profile  Recent Labs Lab 07/25/16 1306 07/26/16 0805  INR 1.14 1.14   ------------------------------------------------------------------------------------------------------------------- No results for input(s): DDIMER in the last 72 hours. -------------------------------------------------------------------------------------------------------------------  Cardiac Enzymes  Recent Labs Lab 07/26/16 0645  TROPONINI 2.06*   ------------------------------------------------------------------------------------------------------------------ Invalid input(s): POCBNP  ---------------------------------------------------------------------------------------------------------------  Urinalysis    Component Value Date/Time   COLORURINE Yellow 07/06/2013 1727   APPEARANCEUR Hazy 07/06/2013 1727   LABSPEC 1.017 07/06/2013 1727   PHURINE 6.0 07/06/2013 1727   GLUCOSEU 50 mg/dL 07/06/2013 1727   HGBUR 1+ 07/06/2013 1727   BILIRUBINUR Negative 07/06/2013 1727   KETONESUR Negative 07/06/2013 1727   PROTEINUR >=500 07/06/2013 1727   NITRITE Negative 07/06/2013 1727   LEUKOCYTESUR 1+ 07/06/2013 1727     RADIOLOGY: X-ray Chest Pa Or Ap  Result Date: 07/25/2016 CLINICAL DATA:  Preop vascular procedure EXAM: CHEST 1 VIEW COMPARISON:  05/21/2016 FINDINGS: Trace right pleural effusion. Bilateral mild interstitial thickening. No pneumothorax. No focal consolidation. Stable cardiomegaly. Dual-lumen right-sided central venous catheter in unchanged position. No acute osseous abnormality. IMPRESSION: Cardiomegaly with mild pulmonary vascular congestion. Electronically Signed   By: Kathreen Devoid   On: 07/25/2016 13:36   Dg Chest Port 1 View  Result Date:  07/26/2016 CLINICAL DATA:  Chest pain.  Dialysis patient. EXAM: PORTABLE CHEST 1 VIEW COMPARISON:  07/25/2016 FINDINGS: Central venous catheter tip remains in the lower SVC unchanged. Small right pleural effusion unchanged. Mild bibasilar atelectasis unchanged. Decreased lung volume. Negative for edema. IMPRESSION: Small right pleural effusion and mild right lower lobe atelectasis unchanged. Hypoventilation with decreased lung volume. Electronically Signed   By: Franchot Gallo M.D.   On: 07/26/2016 07:17    EKG: Orders placed or performed during the hospital encounter of 07/26/16  . EKG 12-Lead  . EKG 12-Lead  .  ED EKG within 10 minutes  . ED EKG within 10 minutes  . EKG 12-Lead  . EKG 12-Lead  . ED EKG 12-Lead  . ED EKG 12-Lead  . EKG 12-Lead  . EKG 12-Lead    IMPRESSION AND PLAN:  * Sepsis   IV vancomycin and Zosyn for now, check MRSA screen, likely from recent permacath placement, will wait for blood culture results and then we may have to involve infectious disease.  * Non-ST elevation MI   Troponin is high, patient has history of coronary artery disease and stent also have CHF and he is very high risk of having recurrence of blockages so we will keep on heparin IV drip as per recommendation of cardiologist, follow serial troponin and monitor on telemetry.   Get a repeat echocardiogram and further management as per cardiologist.  * Hyperkalemia   Urgent hemodialysis is planned by nephrologist, I appreciate help.  * Acute metabolic encephalopathy   Secondary to hyperkalemia, sepsis- continue monitoring.  * Hyperlipidemia   Continue statin, get lipid panel checked.  * Hypertension   Hold metoprolol now because of sepsis, keep monitoring.  * Diabetes    little pioglitazone at this time. Keep on insulin sliding scale coverage.    All the records are reviewed and case discussed with ED provider. Management plans discussed with the patient, family and they are in  agreement.  CODE STATUS: DO NOT RESUSCITATE  Code Status History    Date Active Date Inactive Code Status Order ID Comments User Context   05/22/2016 12:34 AM 05/24/2016  7:11 PM Full Code [id number]  Harvie Bridge, DO Inpatient   01/13/2015  1:44 AM 01/14/2015  2:46 PM Full Code [id number]  Juluis Mire, MD ED   12/15/2014  9:32 AM 12/15/2014  2:31 PM Full Code [id number]  Algernon Huxley, MD Inpatient   05/06/2014  8:29 PM 05/10/2014  5:23 PM Full Code [id number]  Elam Dutch, MD Inpatient   05/04/2014 10:24 PM 05/06/2014  8:29 PM Full Code [id number]  Rise Patience, MD Inpatient     Patient's sister and brother-in-law were present in the room, discussed with them about patient's critical condition and suggested plan.   TOTAL TIME TAKING CARE OF THIS PATIENT: 50 critical care minutes.    Vaughan Basta M.D on 07/26/2016   Between 7am to 6pm - Pager - [phone]  After 6pm go to [email or web address] - password [email or web address] [facility]  Office  [phone]  CC: Primary care physician; [facility]   Note: This dictation was prepared with Dragon dictation along with smaller phrase technology. Any transcriptional errors that result from this process are unintentional.

## 2016-07-26 NOTE — Progress Notes (Signed)
POST DIALYSIS ASSESSMENT 

## 2016-07-26 NOTE — Progress Notes (Signed)
ANTICOAGULATION CONSULT NOTE - Initial Consult  Pharmacy Consult for Heparin Drip Indication: chest pain/ACS  Allergies  Allergen Reactions  . Dust Mite Extract     Patient Measurements: Weight:  (PATIENT ON ER BED) Heparin Dosing Weight: 80.3kg  Vital Signs: Temp: 101.1 F (38.4 C) (03/02 1120) Temp Source: Axillary (03/02 1120) BP: 132/66 (03/02 1130) Pulse Rate: 102 (03/02 1120)  Labs:  Recent Labs  07/25/16 1306 07/25/16 1359 07/26/16 0645 07/26/16 0805 07/26/16 0904  HGB 12.8* 12.9* 12.3*  --   --   HCT 38.1* 38.0* 36.6*  --   --   PLT 119*  --  117*  --   --   APTT 35  --   --  35  --   LABPROT 14.7  --   --  14.7  --   INR 1.14  --   --  1.14  --   CREATININE 10.31*  --  12.17*  --   --   TROPONINI  --   --  2.06*  --  2.30*    Estimated Creatinine Clearance: 7.1 mL/min (by C-G formula based on SCr of 12.17 mg/dL (H)).   Medical History: Past Medical History:  Diagnosis Date  . Anemia   . Asthma   . CHF (congestive heart failure) ([place])   . Chronic systolic heart failure ([place])   . Complication of anesthesia    hypotension  . COPD (chronic obstructive pulmonary disease) ([place])   . Coronary artery disease   . Dialysis patient ([place])    Mon, Wed, Fri  . End stage renal disease ([place])   . GERD (gastroesophageal reflux disease)   . Headache   . History of kidney stones   . HLD (hyperlipidemia)   . HTN (hypertension)   . Hyperparathyroidism   . Myocardial infarction   . Peripheral vascular disease ([place])   . Shortness of breath dyspnea   . Sleep apnea   . Tobacco dependence     Medications:  Scheduled:  . insulin aspart  0-9 Units Subcutaneous TID WC   Infusions:  . heparin 950 Units/hr (07/26/16 0821)  . piperacillin-tazobactam (ZOSYN)  IV    . vancomycin      Assessment: 61 year old male who had a right-sided permacath placed just yesterday. Patient was supposed to go to dialysis this morning but was complained to EMS of chest pain. An  EKG was done in the field and the patient was thought to be having a STEMI. Pharmacy consulted to dose and manage heparin drip.   Goal of Therapy:  Heparin level 0.3-0.7 units/ml Monitor platelets by anticoagulation protocol: Yes   Plan:  Give 4000 units bolus x 1 Start heparin infusion at 950 units/hr Check anti-Xa level in 8 hours and daily while on heparin Continue to monitor H&H and platelets   Heparin level ordered for 3/2 at 16:00.  Olivia Canter, RPh Clinical Pharmacist 07/26/2016,11:56 AM

## 2016-07-26 NOTE — Progress Notes (Signed)
[place] responded to a Code Stemi. Upon arrival, the Medical team was attending to Pt. [place] contacted the Pt sister to inform her of Pt presence at [facility]. Code Stemi was cancelled. CH provided silent prayer and will notify the unit [facility] for a follow up.    07/26/16 0700  Clinical Encounter Type  Visited With Patient;Health care provider  Visit Type Initial;Spiritual support;Code;ED (Code Stemi)  Referral From Nurse  Consult/Referral To Chaplain  Spiritual Encounters  Spiritual Needs Prayer

## 2016-07-26 NOTE — Progress Notes (Signed)
HD COMPLETED  

## 2016-07-26 NOTE — ED Notes (Signed)
Pt cbg 63. Given juice.

## 2016-07-26 NOTE — ED Notes (Signed)
Surgicel placed under dialysis site.

## 2016-07-26 NOTE — ED Notes (Signed)
Received call from Cath Lab, Latanya Presser, Dr. Josefa Half cancelled the [place],  [facility] and informed Judson Roch

## 2016-07-26 NOTE — Progress Notes (Signed)
ANTICOAGULATION CONSULT NOTE - Initial Consult  Pharmacy Consult for Heparin Drip Indication: chest pain/ACS  Allergies  Allergen Reactions  . Dust Mite Extract     Patient Measurements: Height: 6\' 6"  (198.1 cm) Weight: 175 lb (79.4 kg) IBW/kg (Calculated) : 91.4 Heparin Dosing Weight: 80.3kg  Vital Signs: Temp: 103 F (39.4 C) (03/02 1712) Temp Source: Oral (03/02 1712) BP: 126/87 (03/02 1813) Pulse Rate: 122 (03/02 1813)  Labs:  Recent Labs  07/25/16 1306 07/25/16 1359 07/26/16 0645 07/26/16 0805 07/26/16 0904 07/26/16 1710  HGB 12.8* 12.9* 12.3*  --   --   --   HCT 38.1* 38.0* 36.6*  --   --   --   PLT 119*  --  117*  --   --   --   APTT 35  --   --  35  --   --   LABPROT 14.7  --   --  14.7  --   --   INR 1.14  --   --  1.14  --   --   HEPARINUNFRC  --   --   --   --   --  0.25*  CREATININE 10.31*  --  12.17*  --   --   --   TROPONINI  --   --  2.06*  --  2.30* 1.82*    Estimated Creatinine Clearance: 7.1 mL/min (by C-G formula based on SCr of 12.17 mg/dL (H)).   Medical History: Past Medical History:  Diagnosis Date  . Anemia   . Asthma   . CHF (congestive heart failure) ([place])   . Chronic systolic heart failure ([place])   . Complication of anesthesia    hypotension  . COPD (chronic obstructive pulmonary disease) ([place])   . Coronary artery disease   . Dialysis patient ([place])    Mon, Wed, Fri  . End stage renal disease ([place])   . GERD (gastroesophageal reflux disease)   . Headache   . History of kidney stones   . HLD (hyperlipidemia)   . HTN (hypertension)   . Hyperparathyroidism   . Myocardial infarction   . Peripheral vascular disease ([place])   . Shortness of breath dyspnea   . Sleep apnea   . Tobacco dependence     Medications:  Scheduled:  . [START ON 07/27/2016] aspirin  325 mg Oral BH-q7a  . [START ON 07/27/2016] calcium acetate  667 mg Oral TID WC  . fluticasone  1 spray Each Nare Daily  . heparin  1,150 Units Intravenous Once  . insulin  aspart  0-9 Units Subcutaneous TID WC  . midodrine  5 mg Oral Q M,W,F  . mometasone-formoterol  2 puff Inhalation BID  . multivitamin  1 tablet Oral QHS  . multivitamin with minerals  1 tablet Oral Daily  . [START ON 07/27/2016] pantoprazole  40 mg Oral Daily  . piperacillin-tazobactam (ZOSYN)  IV  3.375 g Intravenous Q12H  . [START ON 07/27/2016] polyethylene glycol  17 g Oral BH-q7a  . [START ON 07/27/2016] sevelamer carbonate  800 mg Oral Q breakfast  . simvastatin  20 mg Oral Daily  . sodium chloride flush  3 mL Intravenous Q12H  . tiotropium  18 mcg Inhalation Daily  . vancomycin  1,000 mg Intravenous Q M,W,F-HD   Infusions:  . heparin 950 Units/hr (07/26/16 6063)    Assessment: 61 year old male who had a right-sided permacath placed just yesterday. Patient was supposed to go to dialysis this morning but was complained to EMS  of chest pain. An EKG was done in the field and the patient was thought to be having a STEMI. Pharmacy consulted to dose and manage heparin drip.   Goal of Therapy:  Heparin level 0.3-0.7 units/ml Monitor platelets by anticoagulation protocol: Yes   Plan:  Give 4000 units bolus x 1 Start heparin infusion at 950 units/hr Check anti-Xa level in 8 hours and daily while on heparin Continue to monitor H&H and platelets   3/2 @ 1710 HL 0.25; subtherapeutic. Spoke with nurse about heparin running the whole time during dialysis and she confirmed. Will give heparin 1150 unit bolus x1 and increase heparin drip to 1100 units. Will recheck HL in 8hrs.    Loree Fee, PharmD Clinical Pharmacist 07/26/2016,6:16 PM

## 2016-07-26 NOTE — ED Notes (Signed)
CODE SEPSIS CALLED TO [facility] AT [facility]

## 2016-07-26 NOTE — ED Notes (Signed)
Pt transported to dialysis

## 2016-07-26 NOTE — ED Notes (Signed)
Pt not c/o any chest pain currently. Pt alert and oriented. Pt  sitting in bed shivering, reporting he is cold. Temperature checked. Pt has temeprature of 103.3. MD notified and orders obtained.

## 2016-07-26 NOTE — ED Notes (Signed)
Received call from Cath Lab, Latanya Presser, Dr. Josefa Half cancelled the [place],  [facility] and informed Judson Roch

## 2016-07-26 NOTE — ED Notes (Signed)
Report to Jud, [place]

## 2016-07-26 NOTE — Progress Notes (Addendum)
Pharmacy Antibiotic Note  [name] is a 61 y.o. male admitted on 07/26/2016 with  sepsis.  Pharmacy has been consulted for Zosyn and Vancomcyin dosing. Patient received Zosyn 3.375 IV x 1 dose and Vancomycin 1gm IV x 1 dose. Patient has ESRD and W/HD on MWF.   Plan: Will order Vancomycin 1000mg  IV after each HD session. Patient should get HD today. Vancomycin level ordered prior to 3rd HD session. Pre-HD goal:15-25 mcg/ml  Will start patient on Zosyn 3.375 IV EI every 12 hours.       Temp (24hrs), Avg:101.3 F (38.5 C), Min:98.6 F (37 C), Max:103.3 F (39.6 C)   Recent Labs Lab 07/25/16 1306 07/26/16 0645 07/26/16 0805  WBC 12.5* 15.0*  --   CREATININE 10.31* 12.17*  --   LATICACIDVEN  --   --  1.5    Estimated Creatinine Clearance: 7.1 mL/min (by C-G formula based on SCr of 12.17 mg/dL (H)).    Allergies  Allergen Reactions  . Dust Mite Extract     Antimicrobials this admission: 3/2 Zosyn >>  3/2 Vancomycin >>   Dose adjustments this admission:   Microbiology results: 3/2 BCx: Sent  3/2 UCx: sent 3/2 MRSA PCR: sent   Thank you for allowing pharmacy to be a part of this patient's care.  Pernell Dupre, PharmD, BCPS Clinical Pharmacist 07/26/2016 11:14 AM

## 2016-07-26 NOTE — Progress Notes (Signed)
Family Meeting Note  Advance Directive:no  Today a meeting took place with the sister and brother in law.  Patient is unable to participate due DI:XVEZBM capacity lethargic   The following clinical team members were present during this meeting:MD  The following were discussed:Patient's diagnosis: NSTEMI, Sepsis, ESRD on HD- hyperkalemia, CAD, CHF - EF 20 % , Patient's progosis: Unable to determine and Goals for treatment: DNR  I discussed with patient's sister about his critical condition and overall very poor prognosis depending on his multiple medical conditions. She told her there is no advanced diuretic to done by the patient but herself and one brother are the closest living relatives to the patient and they both would be his decision making people in any adverse events. Further she confirms that they both would not want the patient to be resuscitated in the event if his heart stops, no more they would want him to go on the ventilator machine.  Additional follow-up to be provided: Cardiology and nephrology consults.  Time spent during discussion:20 minutes  Rashawnda Gaba, Rosalio Macadamia, MD

## 2016-07-26 NOTE — Progress Notes (Signed)
HD STARTED  

## 2016-07-26 NOTE — ED Provider Notes (Addendum)
Signout from Dr. Erlene Quan this 61 year old male who had a right-sided permacath placed just yesterday. Patient was supposed to go to dialysis this morning but was complained to EMS of chest pain. An EKG was done in the field and the patient was thought to be having a STEMI. STEMI alert was called and the patient was evaluated by cardiology in the emergency department. However, the patient was chest pain-free and the EKG did not appear to be showing STEMI. Patient also with some mild oozing from the site of insertion of the permacath on the right side that was placed yesterday. At this time the patient continues to deny any chest pain. However, his family is at the bedside and says that he has been having on and off chest pain over the past several days. Patient denies any blood in the stool or vomitus.  Physical Exam  BP (!) 117/92   Pulse (!) 101   Resp 18   SpO2 96%  ----------------------------------------- 7:37 AM on 07/26/2016 -----------------------------------------   Physical Exam Mild-to-moderate amount of clotted blood around the site of the permacath without any active bleeding at this time. ED Course  Procedures ----------------------------------------- 7:38 AM on 07/26/2016 -----------------------------------------   MDM Patient found to have a potassium of 7 as well as troponin of 2. Patient ordered calcium as well as bicarbonate and insulin and dextrose. We will also be ordering heparin as well as placing Surgicel around the site of the line. I discussed the case with the renal doctor, Dr. Andrena Mews, who says that he'll be dialyzing the patient as soon as possible. I also discussed the case with Dr. Saralyn Pilar of the cardiology service who agrees with heparin but will not be taking the patient urgently to the catheterization lab. Patient does remain chest pain-free.  Page also out to Vascular.  Awaiting response.  At this time I feel that the benefit of heparin outweighs the risk.  The bleeding is likely localized at the insertion site of the catheter from yesterday.       Orbie Pyo, MD 07/26/16 [phone]  Patient's temperature found to be 103. Code sepsis was called.  Multiple possible sources including the patient's new permacath, lung finding which could be pneumonia on the chest x-ray. We will also swab for flu. Hospitalist, Dr. Tressia Miners will be admitting the patient was updated. Have not received any call back from vascular surgery as of this time.    Orbie Pyo, MD 07/26/16 [phone]  CRITICAL CARE Performed by: Doran Stabler   Total critical care time: 35 minutes  Critical care time was exclusive of separately billable procedures and treating other patients.  Critical care was necessary to treat or prevent imminent or life-threatening deterioration.  Critical care was time spent personally by me on the following activities: development of treatment plan with patient and/or surrogate as well as nursing, discussions with consultants, evaluation of patient's response to treatment, examination of patient, obtaining history from patient or surrogate, ordering and performing treatments and interventions, ordering and review of laboratory studies, ordering and review of radiographic studies, pulse oximetry and re-evaluation of patient's condition.     Orbie Pyo, MD 07/26/16 [phone]

## 2016-07-26 NOTE — Consult Note (Signed)
[facility]  CARDIOLOGY CONSULT NOTE  Patient ID: [name] MRN: [id number] DOB/AGE: 1956-04-07 61 y.o.  Admit date: 07/26/2016 Referring Physician Owens Shark Primary Physician [facility] Primary Cardiologist Nehemiah Massed Reason for Consultation Abnormal ECG  HPI: 32 year old gentleman referred for evaluation of chest pain and abnormal ECG. The patient was in his usual state of health. The patient has known coronary disease, status post prior coronary stent, with chronic systolic congestive heart failure, end-stage renal disease on chronic hemodialysis. The patient was at the hemodialysis center, when he was noted to have decreased level of consciousness and bleeding from his Port-A-Cath. EMS was called, and upon arrival telemetry strip revealed ST elevation in lead 3. The patient was brought to [facility] emergency room where 12-lead electrocardiogram revealed rhythm, left ventricular rate, T-wave inversions in leads 1L and V6, unchanged compared to prior ECG. The patient denies chest pain. Admission labs were notable for elevated white count of 15,000.  Review of systems complete and found to be negative unless listed above     Past Medical History:  Diagnosis Date  . Anemia   . Asthma   . CHF (congestive heart failure) ([place])   . Chronic systolic heart failure ([place])   . Complication of anesthesia    hypotension  . COPD (chronic obstructive pulmonary disease) ([place])   . Coronary artery disease   . Dialysis patient ([place])    Mon, Wed, Fri  . End stage renal disease ([place])   . GERD (gastroesophageal reflux disease)   . Headache   . History of kidney stones   . HLD (hyperlipidemia)   . HTN (hypertension)   . Hyperparathyroidism   . Myocardial infarction   . Peripheral vascular disease ([place])   . Shortness of breath dyspnea   . Sleep apnea   . Tobacco dependence     Past Surgical History:  Procedure Laterality Date  . AMPUTATION Left 05/06/2014   Procedure: AMPUTATION BELOW KNEE;  Surgeon:  Elam Dutch, MD;  Location: [place];  Service: Vascular;  Laterality: Left;  . AMPUTATION Right 01/12/2015   Procedure: Foot transmetatarsal amputation;  Surgeon: Algernon Huxley, MD;  Location: [facility];  Service: Vascular;  Laterality: Right;  . APPLICATION OF WOUND VAC Right 03/01/2015   Procedure: Application of Bio-connekt graft and wound vac application to right foot ;  Surgeon: Algernon Huxley, MD;  Location: [facility];  Service: Vascular;  Laterality: Right;  . AV FISTULA PLACEMENT Left   . CARDIAC CATHETERIZATION     stent placement   . CORONARY ANGIOPLASTY    . LIGATION OF ARTERIOVENOUS  FISTULA Right 01/31/2016   Procedure: LIGATION OF ARTERIOVENOUS  FISTULA;  Surgeon: Algernon Huxley, MD;  Location: [facility];  Service: Vascular;  Laterality: Right;  . PERIPHERAL VASCULAR CATHETERIZATION Right 12/15/2014   Procedure: Lower Extremity Angiography;  Surgeon: Algernon Huxley, MD;  Location: [place] [facility];  Service: Cardiovascular;  Laterality: Right;  . PERIPHERAL VASCULAR CATHETERIZATION  12/15/2014   Procedure: Lower Extremity Intervention;  Surgeon: Algernon Huxley, MD;  Location: [place] CV [facility];  Service: Cardiovascular;;  . PERIPHERAL VASCULAR CATHETERIZATION Right 08/14/2015   Procedure: A/V Shuntogram/Fistulagram;  Surgeon: Algernon Huxley, MD;  Location: [place] CV [facility];  Service: Cardiovascular;  Laterality: Right;  . PERIPHERAL VASCULAR CATHETERIZATION N/A 08/14/2015   Procedure: A/V Shunt Intervention;  Surgeon: Algernon Huxley, MD;  Location: [place] [facility];  Service: Cardiovascular;  Laterality: N/A;  . PERIPHERAL VASCULAR CATHETERIZATION N/A 01/11/2016  Procedure: Dialysis/Perma Catheter Insertion;  Surgeon: Algernon Huxley, MD;  Location: [place] [facility];  Service: Cardiovascular;  Laterality: N/A;  . REVISON OF ARTERIOVENOUS FISTULA Right 02/17/2016   Procedure: removal of AV fistula;  Surgeon: Serafina Mitchell, MD;  Location: [facility];  Service: Vascular;  Laterality: Right;  .  REVISON OF ARTERIOVENOUS FISTULA Right 01/31/2016   Procedure: REVISON OF ARTERIOVENOUS FISTULA ( BRACHIOCEPHALIC ) W/ ARTEGRAFT;  Surgeon: Algernon Huxley, MD;  Location: [facility];  Service: Vascular;  Laterality: Right;  . TRANSMETATARSAL AMPUTATION Right 05/04/2015   Procedure: TRANSMETATARSAL AMPUTATION REVISION, great toe amputation;  Surgeon: Algernon Huxley, MD;  Location: [facility];  Service: Vascular;  Laterality: Right;     (Not in a hospital admission) Social History   Social History  . Marital status: Widowed    Spouse name: N/A  . Number of children: N/A  . Years of education: N/A   Occupational History  . Not on file.   Social History Main Topics  . Smoking status: Light Tobacco Smoker    Packs/day: 0.25    Types: Cigarettes  . Smokeless tobacco: Never Used     Comment: 5  . Alcohol use No  . Drug use: No     Comment: has used crack cocaine in past   . Sexual activity: Not Currently    Birth control/ protection: Abstinence   Other Topics Concern  . Not on file   Social History Narrative   Engaged.     Family History  Problem Relation Age of Onset  . Heart failure Other   . Hypertension Other   . Leukemia Other   . Diabetes Other       Review of systems complete and found to be negative unless listed above      PHYSICAL EXAM  General: Well developed, well nourished, in no acute distress HEENT:  Normocephalic and atramatic Neck:  No JVD.  Lungs: Clear bilaterally to auscultation and percussion. Heart: HRRR . Normal S1 and S2 without gallops or murmurs.  Abdomen: Bowel sounds are positive, abdomen soft and non-tender  Msk:  Back normal, normal gait. Normal strength and tone for age. Extremities: No clubbing, cyanosis or edema.   Neuro: Alert and oriented X 3. Psych:  Good affect, responds appropriately  Labs:   Lab Results  Component Value Date   WBC 15.0 (H) 07/26/2016   HGB 12.3 (L) 07/26/2016   HCT 36.6 (L) 07/26/2016   MCV 91.9 07/26/2016    PLT 117 (L) 07/26/2016    Recent Labs Lab 07/25/16 1306 07/25/16 1359  NA 137 137  K 5.6* 5.8*  CL 100*  --   CO2 23  --   BUN 70*  --   CREATININE 10.31*  --   CALCIUM 9.4  --   GLUCOSE 95 91   Lab Results  Component Value Date   CKTOTAL 276 07/06/2013   CKMB 3.2 03/16/2014   TROPONINI 0.15 ([place]) 05/22/2016   No results found for: CHOL No results found for: HDL No results found for: LDLCALC No results found for: TRIG No results found for: CHOLHDL No results found for: LDLDIRECT    Radiology: X-ray Chest Pa Or Ap  Result Date: 07/25/2016 CLINICAL DATA:  Preop vascular procedure EXAM: CHEST 1 VIEW COMPARISON:  05/21/2016 FINDINGS: Trace right pleural effusion. Bilateral mild interstitial thickening. No pneumothorax. No focal consolidation. Stable cardiomegaly. Dual-lumen right-sided central venous catheter in unchanged position. No acute osseous abnormality. IMPRESSION: Cardiomegaly with mild  pulmonary vascular congestion. Electronically Signed   By: Kathreen Devoid   On: 07/25/2016 13:36    EKG: Normal sinus rhythm, LVH, T-wave inversions laterally, no evidence for ST elevation myocardial infarction  ASSESSMENT AND PLAN:   1. Abnormal ECG, unchanged compared to prior ECG, no diagnostic ST elevation consistent with ST elevation myocardial infarction 2. Chest pain, with typical and atypical features 3. Decreased level of consciousness 4. Bleeding from Port-A-Cath, Dr. Lucky Cowboy notified  Recommendations  1. Cancel code STEMI 2. Cycle cardiac enzymes 3. Defer full dose anticoagulation at this time 4. Further recommendations pending patient's initial clinical course  Signed: Isaias Cowman MD,PhD, [facility] 07/26/2016, 7:06 AM

## 2016-07-27 ENCOUNTER — Inpatient Hospital Stay: Payer: Medicare Other

## 2016-07-27 ENCOUNTER — Inpatient Hospital Stay
Admission: EM | Admit: 2016-07-27 | Discharge: 2016-07-27 | Disposition: A | Discharge status: Home / Self Care | Payer: Medicare Other | Source: Home / Self Care | Attending: Internal Medicine | Admitting: Internal Medicine

## 2016-07-27 DIAGNOSIS — Z992 Dependence on renal dialysis: Secondary | ICD-10-CM

## 2016-07-27 DIAGNOSIS — A419 Sepsis, unspecified organism: Secondary | ICD-10-CM

## 2016-07-27 LAB — BLOOD GAS, ARTERIAL
ACID-BASE EXCESS: 4.2 mmol/L — AB (ref 0.0–2.0)
Bicarbonate: 27.1 mmol/L (ref 20.0–28.0)
FIO2: 0.28
O2 Saturation: 97.3 %
PATIENT TEMPERATURE: 37
pCO2 arterial: 34 mmHg (ref 32.0–48.0)
pH, Arterial: 7.51 — ABNORMAL HIGH (ref 7.350–7.450)
pO2, Arterial: 85 mmHg (ref 83.0–108.0)

## 2016-07-27 LAB — BASIC METABOLIC PANEL
ANION GAP: 12 (ref 5–15)
BUN: 58 mg/dL — ABNORMAL HIGH (ref 6–20)
CHLORIDE: 96 mmol/L — AB (ref 101–111)
CO2: 28 mmol/L (ref 22–32)
Calcium: 8.6 mg/dL — ABNORMAL LOW (ref 8.9–10.3)
Creatinine, Ser: 8.7 mg/dL — ABNORMAL HIGH (ref 0.61–1.24)
GFR calc Af Amer: 7 mL/min — ABNORMAL LOW (ref >60)
GFR calc non Af Amer: 6 mL/min — ABNORMAL LOW (ref >60)
GLUCOSE: 125 mg/dL — AB (ref 65–99)
POTASSIUM: 5.1 mmol/L (ref 3.5–5.1)
Sodium: 136 mmol/L (ref 135–145)

## 2016-07-27 LAB — HEPARIN LEVEL (UNFRACTIONATED): Heparin Unfractionated: 0.31 IU/mL (ref 0.30–0.70)

## 2016-07-27 LAB — ECHOCARDIOGRAM COMPLETE
HEIGHTINCHES: 78 in
WEIGHTICAEL: 2800 [oz_av]

## 2016-07-27 LAB — CBC
HEMATOCRIT: 33.8 % — AB (ref 40.0–52.0)
HEMOGLOBIN: 11.2 g/dL — AB (ref 13.0–18.0)
MCH: 30.7 pg (ref 26.0–34.0)
MCHC: 33.1 g/dL (ref 32.0–36.0)
MCV: 93 fL (ref 80.0–100.0)
Platelets: 111 10*3/uL — ABNORMAL LOW (ref 150–440)
RBC: 3.63 MIL/uL — ABNORMAL LOW (ref 4.40–5.90)
RDW: 17.2 % — ABNORMAL HIGH (ref 11.5–14.5)
WBC: 16.5 10*3/uL — ABNORMAL HIGH (ref 3.8–10.6)

## 2016-07-27 LAB — GLUCOSE, CAPILLARY
GLUCOSE-CAPILLARY: 125 mg/dL — AB (ref 65–99)
GLUCOSE-CAPILLARY: 87 mg/dL (ref 65–99)
Glucose-Capillary: 91 mg/dL (ref 65–99)
Glucose-Capillary: 122 mg/dL — ABNORMAL HIGH (ref 65–99)

## 2016-07-27 LAB — HEPATITIS B SURFACE ANTIBODY, QUANTITATIVE: Hepatitis B-Post: <3.1 m[IU]/mL — ABNORMAL LOW (ref >9.9)

## 2016-07-27 LAB — HEPATITIS B SURFACE ANTIGEN: Hepatitis B Surface Ag: NEGATIVE

## 2016-07-27 LAB — HIV ANTIBODY (ROUTINE TESTING W REFLEX): HIV Screen 4th Generation wRfx: NONREACTIVE

## 2016-07-27 LAB — MRSA PCR SCREENING: MRSA BY PCR: NEGATIVE

## 2016-07-27 MED ORDER — LIDOCAINE HCL 1 % IJ SOLN
20.0000 mL | Freq: Once | INTRAMUSCULAR | Status: AC
  Administered 2016-07-27: 10 mL via INTRADERMAL
  Filled 2016-07-27: qty 20

## 2016-07-27 MED ORDER — VANCOMYCIN HCL 500 MG IV SOLR
500.0000 mg | Freq: Once | INTRAVENOUS | Status: AC
  Administered 2016-07-27: 500 mg via INTRAVENOUS
  Filled 2016-07-27: qty 500

## 2016-07-27 MED ORDER — CHLORHEXIDINE GLUCONATE CLOTH 2 % EX PADS
6.0000 | MEDICATED_PAD | Freq: Every day | CUTANEOUS | Status: DC
  Administered 2016-07-28 – 2016-07-31 (×4): 6 via TOPICAL

## 2016-07-27 MED ORDER — MUPIROCIN 2 % EX OINT
1.0000 "application " | TOPICAL_OINTMENT | Freq: Two times a day (BID) | CUTANEOUS | Status: DC
  Administered 2016-07-28 – 2016-07-30 (×5): 1 via NASAL
  Filled 2016-07-27: qty 22

## 2016-07-27 NOTE — Progress Notes (Signed)
Notified Dr. Estanislado Pandy of patients continued bleeding at dialysis catheter site. Dressing changed and pressure held for 20 minutes and still bleeding. Pressure dressing applied. Will continue to monitor.

## 2016-07-27 NOTE — Progress Notes (Signed)
[place] responded to a page for RR for a Pt in [place]. Pt was unresponsive, but later was determined to be alert and responding to the Nurses questions. Pt's family was bedside. While the medical teams was evaluating the Pt, [place] talked to the Pt's family and informed them that the [facility] was available if needed.     07/27/16 2100  Clinical Encounter Type  Visited With Patient;Patient and family together  Visit Type Follow-up;Spiritual support;Code;Trauma  Referral From Nurse  Consult/Referral To Chaplain  Spiritual Encounters  Spiritual Needs Prayer;Emotional;Other (Comment)

## 2016-07-27 NOTE — Clinical Social Work Note (Signed)
CSW received consult that patient is admitted from [facility]. CSW is following and will assess when able.  Santiago Bumpers, MSW, Latanya Presser [phone]

## 2016-07-27 NOTE — Significant Event (Signed)
Rapid Response Event Note  Overview: Time Called: 1702 Arrival Time: 1702 Event Type: Neurologic  Initial Focused Assessment: RN arrived in patient's room with patient staring forward on 2 L nasal cannula with family at bedside. Patient's RN on the phone with Dr. Leslye Peer giving update on patient's condition. Patient had been alert and oriented today, normothermic, and on room air, now RN had been called to room by patient's family due to illogical speech and staring forward. Fever spiked to 102.3 F (orally). Patient started on 2 L nasal cannula prior to RRT RN's arrival (patient's RN had been unable to get an O2 saturation). Patient was currently alert and oriented, with equal moderate grips, no facial asymmetry. Lungs clear and equal bilaterally. Patient 100% O2 saturation on 2L nasal cannula, no elevated work of breathing, HR 103, ST, 103/75, MAP 80.    Interventions: Helped RN turn patient to give tylenol suppository.   Plan of Care (if not transferred): Patient's vital signs stable for now. MD ordered stat CT of head and neck and stat ABG. Patient's RN's informed to call RRT RN if patient becomes unstable.  Event Summary: Name of Physician Notified: Dr. Leslye Peer (over phone, Dr. Anselm Jungling arrived in person to assess patient) at 1703    at    Outcome: Stayed in room and stabalized  Event End Time: [place], [place]

## 2016-07-27 NOTE — Progress Notes (Signed)
Dialysis catheter continues to ooze blood. Pressure dressing saturated. Cleaned and changed dressing again. Pressure held for 20 minutes at two different intervals. Dr. Juleen China notified. Heparin drip discontinued. WIll continue to monitor.

## 2016-07-27 NOTE — Procedures (Signed)
Right IJ permacath removed at bedside after informed consent. Right anterior chest wall prepped and draped in the usual sterile fashion.Catheter site was noted to have old clot and serous drainage.  1% lidocaine used to anesthetize the area around the catheter.  The catheter was easily removed. Tip sent for culture. No evidence of incorporation.  Dry sterile dressing placed.  No apparent complications.  Patient tolerated procedure well.

## 2016-07-27 NOTE — Progress Notes (Signed)
Patient ID: [name], male   DOB: 1955/09/19, 61 y.o.   MRN: [id number]  [facility] PROGRESS NOTE  MARTAVION COUPER VZS:[id number] DOB: 02-15-1956 DOA: 07/26/2016 PCP: [place]  HPI/Subjective: Patient feeling great today, Offers no complaints today. Did have some neck discomfort the other day. Patient states he was out of it yesterday  Objective: Vitals:   07/27/16 0629 07/27/16 0838  BP: 112/63 104/63  Pulse: 79 81  Resp: 18 20  Temp: 99 F (37.2 C) 98.3 F (36.8 C)    Filed Weights   07/26/16 1726  Weight: 79.4 kg (175 lb)    ROS: Review of Systems  Constitutional: Negative for chills and fever.  Eyes: Negative for blurred vision.  Respiratory: Negative for cough and shortness of breath.   Cardiovascular: Negative for chest pain.  Gastrointestinal: Negative for abdominal pain, constipation, diarrhea, nausea and vomiting.  Genitourinary: Negative for dysuria.  Musculoskeletal: Positive for neck pain. Negative for joint pain.  Neurological: Negative for dizziness and headaches.   Exam: Physical Exam  Constitutional: He is oriented to person, place, and time.  HENT:  Nose: No mucosal edema.  Mouth/Throat: No oropharyngeal exudate or posterior oropharyngeal edema.  Eyes: Conjunctivae, EOM and lids are normal. Pupils are equal, round, and reactive to light.  Neck: No JVD present. Carotid bruit is not present. No edema present. No thyroid mass and no thyromegaly present.  Cardiovascular: S1 normal and S2 normal.  Exam reveals no gallop.   No murmur heard. Pulses:      Dorsalis pedis pulses are 2+ on the right side, and 2+ on the left side.  Respiratory: No respiratory distress. He has no wheezes. He has no rhonchi. He has no rales.  GI: Soft. Bowel sounds are normal. There is no tenderness.  Musculoskeletal:       Right ankle: He exhibits no swelling.  Lymphadenopathy:    He has no cervical adenopathy.  Neurological: He is alert and oriented to  person, place, and time. No cranial nerve deficit.  Skin: Skin is warm. No rash noted. Nails show no clubbing.  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      Data Reviewed: Basic Metabolic Panel:  Recent Labs Lab 07/25/16 1306 07/25/16 1359 07/26/16 0645 07/27/16 0139  NA 137 137 135 136  K 5.6* 5.8* 7.0* 5.1  CL 100*  --  103 96*  CO2 23  --  19* 28  GLUCOSE 95 91 94 125*  BUN 70*  --  91* 58*  CREATININE 10.31*  --  12.17* 8.70*  CALCIUM 9.4  --  9.0 8.6*   CBC:  Recent Labs Lab 07/25/16 1306 07/25/16 1359 07/26/16 0645 07/27/16 0139  WBC 12.5*  --  15.0* 16.5*  NEUTROABS 6.0  --   --   --   HGB 12.8* 12.9* 12.3* 11.2*  HCT 38.1* 38.0* 36.6* 33.8*  MCV 93.1  --  91.9 93.0  PLT 119*  --  117* 111*   Cardiac Enzymes:  Recent Labs Lab 07/26/16 0645 07/26/16 0904 07/26/16 1710 07/26/16 2046  TROPONINI 2.06* 2.30* 1.82* 1.74*   BNP (last 3 results)  Recent Labs  05/21/16 1954  BNP 3,452.0*     CBG:  Recent Labs Lab 07/26/16 1040 07/26/16 1541 07/26/16 1719 07/26/16 2122 07/27/16 0819  GLUCAP 65 78 90 107* 91    Recent Results (from the past 240 hour(s))  Surgical pcr screen  Status: None   Collection Time: 07/25/16  1:03 PM  Result Value Ref Range Status   MRSA, PCR NEGATIVE NEGATIVE Final   Staphylococcus aureus NEGATIVE NEGATIVE Final    Comment:        The Xpert SA Assay (FDA approved for NASAL specimens in patients over 73 years of age), is one component of a comprehensive surveillance program.  Test performance has been validated by [facility] for patients greater than or equal to 58 year old. It is not intended to diagnose infection nor to guide or monitor treatment.   Blood Culture (routine x 2)     Status: None (Preliminary result)   Collection Time: 07/26/16  8:08 AM  Result Value Ref Range Status   Specimen Description BLOOD LEFT ANTECUBITAL  Final   Special  Requests BOTTLES DRAWN AEROBIC AND ANAEROBIC BCAV  Final   Culture  Setup Time   Final    GRAM POSITIVE COCCI ANAEROBIC BOTTLE ONLY CRITICAL VALUE NOTED.  VALUE IS CONSISTENT WITH PREVIOUSLY REPORTED AND CALLED VALUE.    Culture GRAM POSITIVE COCCI  Final   Report Status PENDING  Incomplete  Blood Culture (routine x 2)     Status: None (Preliminary result)   Collection Time: 07/26/16  9:57 AM  Result Value Ref Range Status   Specimen Description BLOOD LEFT ARM  Final   Special Requests BOTTLES DRAWN AEROBIC AND ANAEROBIC BCHV  Final   Culture  Setup Time   Final    Organism ID to follow GRAM POSITIVE COCCI AEROBIC BOTTLE ONLY CRITICAL RESULT CALLED TO, READ BACK BY AND VERIFIED WITH: [name] COOKSON AT 2325 07/26/16.PMH    Culture GRAM POSITIVE COCCI  Final   Report Status PENDING  Incomplete  Blood Culture ID Panel (Reflexed)     Status: Abnormal   Collection Time: 07/26/16  9:57 AM  Result Value Ref Range Status   Enterococcus species NOT DETECTED NOT DETECTED Final   Listeria monocytogenes NOT DETECTED NOT DETECTED Final   Staphylococcus species DETECTED (A) NOT DETECTED Final    Comment: CRITICAL RESULT CALLED TO, READ BACK BY AND VERIFIED WITH: [name] COOKSON AT 2325 07/26/16.PMH    Staphylococcus aureus DETECTED (A) NOT DETECTED Final    Comment: Methicillin (oxacillin)-resistant Staphylococcus aureus (MRSA). MRSA is predictably resistant to beta-lactam antibiotics (except ceftaroline). Preferred therapy is vancomycin unless clinically contraindicated. Patient requires contact precautions if  hospitalized. CRITICAL RESULT CALLED TO, READ BACK BY AND VERIFIED WITH: [name] COOKSON AT 2325 07/26/16.PMH    Methicillin resistance DETECTED (A) NOT DETECTED Final    Comment: CRITICAL RESULT CALLED TO, READ BACK BY AND VERIFIED WITH: [name] COOKSON AT 2325 07/26/16.PMH    Streptococcus species NOT DETECTED NOT DETECTED Final   Streptococcus agalactiae NOT DETECTED NOT DETECTED Final    Streptococcus pneumoniae NOT DETECTED NOT DETECTED Final   Streptococcus pyogenes NOT DETECTED NOT DETECTED Final   Acinetobacter baumannii NOT DETECTED NOT DETECTED Final   Enterobacteriaceae species NOT DETECTED NOT DETECTED Final   Enterobacter cloacae complex NOT DETECTED NOT DETECTED Final   Escherichia coli NOT DETECTED NOT DETECTED Final   Klebsiella oxytoca NOT DETECTED NOT DETECTED Final   Klebsiella pneumoniae NOT DETECTED NOT DETECTED Final   Proteus species NOT DETECTED NOT DETECTED Final   Serratia marcescens NOT DETECTED NOT DETECTED Final   Haemophilus influenzae NOT DETECTED NOT DETECTED Final   Neisseria meningitidis NOT DETECTED NOT DETECTED Final   Pseudomonas aeruginosa NOT DETECTED NOT DETECTED Final   Candida albicans NOT DETECTED NOT  DETECTED Final   Candida glabrata NOT DETECTED NOT DETECTED Final   Candida krusei NOT DETECTED NOT DETECTED Final   Candida parapsilosis NOT DETECTED NOT DETECTED Final   Candida tropicalis NOT DETECTED NOT DETECTED Final  MRSA PCR Screening     Status: None   Collection Time: 07/27/16  1:10 AM  Result Value Ref Range Status   MRSA by PCR NEGATIVE NEGATIVE Final    Comment:        The GeneXpert MRSA Assay (FDA approved for NASAL specimens only), is one component of a comprehensive MRSA colonization surveillance program. It is not intended to diagnose MRSA infection nor to guide or monitor treatment for MRSA infections.      Studies: X-ray Chest Pa Or Ap  Result Date: 07/25/2016 CLINICAL DATA:  Preop vascular procedure EXAM: CHEST 1 VIEW COMPARISON:  05/21/2016 FINDINGS: Trace right pleural effusion. Bilateral mild interstitial thickening. No pneumothorax. No focal consolidation. Stable cardiomegaly. Dual-lumen right-sided central venous catheter in unchanged position. No acute osseous abnormality. IMPRESSION: Cardiomegaly with mild pulmonary vascular congestion. Electronically Signed   By: Kathreen Devoid   On: 07/25/2016 13:36    Dg Chest Port 1 View  Result Date: 07/26/2016 CLINICAL DATA:  Chest pain.  Dialysis patient. EXAM: PORTABLE CHEST 1 VIEW COMPARISON:  07/25/2016 FINDINGS: Central venous catheter tip remains in the lower SVC unchanged. Small right pleural effusion unchanged. Mild bibasilar atelectasis unchanged. Decreased lung volume. Negative for edema. IMPRESSION: Small right pleural effusion and mild right lower lobe atelectasis unchanged. Hypoventilation with decreased lung volume. Electronically Signed   By: Franchot Gallo M.D.   On: 07/26/2016 07:17    Scheduled Meds: . aspirin  325 mg Oral BH-q7a  . calcium acetate  667 mg Oral TID WC  . fluticasone  1 spray Each Nare Daily  . insulin aspart  0-9 Units Subcutaneous TID WC  . midodrine  5 mg Oral Q M,W,F  . mometasone-formoterol  2 puff Inhalation BID  . multivitamin  1 tablet Oral QHS  . multivitamin with minerals  1 tablet Oral Daily  . pantoprazole  40 mg Oral Daily  . piperacillin-tazobactam (ZOSYN)  IV  3.375 g Intravenous Q12H  . polyethylene glycol  17 g Oral BH-q7a  . sevelamer carbonate  800 mg Oral Q breakfast  . simvastatin  20 mg Oral Daily  . sodium chloride flush  3 mL Intravenous Q12H  . tiotropium  18 mcg Inhalation Daily  . vancomycin  1,000 mg Intravenous Q M,W,F-HD    Assessment/Plan:  1. MRSA sepsis. Source likely dialysis catheter. Case discussed with nephrologistand no need for urgent dialysis today. Spoke with vascular surgeonto remove catheter.. Continue vancomycin dosed by pharmacy. 2. Acute encephalopathy improved Restart diet 3. Severe hyperkalemia improved with dialysis yesterday 4. Hyperlipidemia unspecified on simvastatin 5. GERD on Protonix 6. Relative hypotension continue to hold antihypertensive medications 7. End-stage renal dissease.  Normal days are Monday Wednesday Friday. Had dialysis yesterday. Case discussed with nephrology.  Code Status:     Code Status Orders        Start     Ordered    07/26/16 1708  Do not attempt resuscitation (DNR)  Continuous    Question Answer Comment  In the event of cardiac or respiratory ARREST Do not call a "code blue"   In the event of cardiac or respiratory ARREST Do not perform Intubation, CPR, defibrillation or ACLS   In the event of cardiac or respiratory ARREST Use medication by any route, position,  wound care, and other measures to relive pain and suffering. May use oxygen, suction and manual treatment of airway obstruction as needed for comfort.   Comments confirmed with pt's sister in room at admission.      07/26/16 1707    Code Status History    Date Active Date Inactive Code Status Order ID Comments User Context   05/22/2016 12:34 AM 05/24/2016  7:11 PM Full Code [id number]  Harvie Bridge, DO Inpatient   01/13/2015  1:44 AM 01/14/2015  2:46 PM Full Code [id number]  Juluis Mire, MD ED   12/15/2014  9:32 AM 12/15/2014  2:31 PM Full Code [id number]  Algernon Huxley, MD Inpatient   05/06/2014  8:29 PM 05/10/2014  5:23 PM Full Code [id number]  Elam Dutch, MD Inpatient   05/04/2014 10:24 PM 05/06/2014  8:29 PM Full Code [id number]  Rise Patience, MD Inpatient     Disposition Plan: to be determined  Consultants:  Nephrology  Vascular surgery  Antibiotics:  Vancomycin  Zosyn  Time spent: 35 minutes in coordination of care in speaking with specialist and nursing staff  [place], [facility]

## 2016-07-27 NOTE — Progress Notes (Signed)
[facility]  ROUNDING NOTE   Subjective:   Hemodialysis yesterday through permcath. Tolerated treatment well. UF of 854mL.   Permcath removed today by Dr. Lorenso Courier. Wound culture and cath tip culture sent.   Family at bedside.   Objective:  Vital signs in last 24 hours:  Temp:  [98.3 F (36.8 C)-103 F (39.4 C)] 98.3 F (36.8 C) (03/03 0838) Pulse Rate:  [79-122] 81 (03/03 0838) Resp:  [18-21] 20 (03/03 0838) BP: (73-135)/(59-120) 104/63 (03/03 0838) SpO2:  [95 %-100 %] 97 % (03/03 0838) Weight:  [79.4 kg (175 lb)] 79.4 kg (175 lb) (03/02 1726)  Weight change:  Filed Weights   07/26/16 1726  Weight: 79.4 kg (175 lb)    Intake/Output: I/O last 3 completed shifts: In: 302 [I.V.:102; IV Piggyback:200] Out: 856 [Other:856]   Intake/Output this shift:  No intake/output data recorded.  Physical Exam: General: Laying in bed  Head: Normocephalic, atraumatic. Moist oral mucosal membranes  Eyes: Anicteric, PERRL  Neck: Supple, trachea midline  Lungs:  Clear to auscultation  Heart: Regular rate and rhythm  Abdomen:  Soft, nontender  Extremities: no peripheral edema.  Neurologic: Nonfocal, moving all four extremities  Skin: No lesions  Access: Right AVF fistula    Basic Metabolic Panel:  Recent Labs Lab 07/25/16 1306 07/25/16 1359 07/26/16 0645 07/27/16 0139  NA 137 137 135 136  K 5.6* 5.8* 7.0* 5.1  CL 100*  --  103 96*  CO2 23  --  19* 28  GLUCOSE 95 91 94 125*  BUN 70*  --  91* 58*  CREATININE 10.31*  --  12.17* 8.70*  CALCIUM 9.4  --  9.0 8.6*    Liver Function Tests: No results for input(s): AST, ALT, ALKPHOS, BILITOT, PROT, ALBUMIN in the last 168 hours. No results for input(s): LIPASE, AMYLASE in the last 168 hours. No results for input(s): AMMONIA in the last 168 hours.  CBC:  Recent Labs Lab 07/25/16 1306 07/25/16 1359 07/26/16 0645 07/27/16 0139  WBC 12.5*  --  15.0* 16.5*  NEUTROABS 6.0  --   --   --   HGB 12.8* 12.9* 12.3*  11.2*  HCT 38.1* 38.0* 36.6* 33.8*  MCV 93.1  --  91.9 93.0  PLT 119*  --  117* 111*    Cardiac Enzymes:  Recent Labs Lab 07/26/16 0645 07/26/16 0904 07/26/16 1710 07/26/16 2046  TROPONINI 2.06* 2.30* 1.82* 1.74*    BNP: Invalid input(s): POCBNP  CBG:  Recent Labs Lab 07/26/16 1541 07/26/16 1719 07/26/16 2122 07/27/16 0819 07/27/16 1139  GLUCAP 78 90 107* 91 125*    Microbiology: Results for orders placed or performed during the hospital encounter of 07/26/16  Blood Culture (routine x 2)     Status: None (Preliminary result)   Collection Time: 07/26/16  8:08 AM  Result Value Ref Range Status   Specimen Description BLOOD LEFT ANTECUBITAL  Final   Special Requests BOTTLES DRAWN AEROBIC AND ANAEROBIC BCAV  Final   Culture  Setup Time   Final    GRAM POSITIVE COCCI ANAEROBIC BOTTLE ONLY CRITICAL VALUE NOTED.  VALUE IS CONSISTENT WITH PREVIOUSLY REPORTED AND CALLED VALUE.    Culture GRAM POSITIVE COCCI  Final   Report Status PENDING  Incomplete  Blood Culture (routine x 2)     Status: None (Preliminary result)   Collection Time: 07/26/16  9:57 AM  Result Value Ref Range Status   Specimen Description BLOOD LEFT ARM  Final   Special Requests BOTTLES DRAWN  AEROBIC AND ANAEROBIC BCHV  Final   Culture  Setup Time   Final    Organism ID to follow GRAM POSITIVE COCCI AEROBIC BOTTLE ONLY CRITICAL RESULT CALLED TO, READ BACK BY AND VERIFIED WITH: [name] COOKSON AT 2325 07/26/16.PMH    Culture GRAM POSITIVE COCCI  Final   Report Status PENDING  Incomplete  Blood Culture ID Panel (Reflexed)     Status: Abnormal   Collection Time: 07/26/16  9:57 AM  Result Value Ref Range Status   Enterococcus species NOT DETECTED NOT DETECTED Final   Listeria monocytogenes NOT DETECTED NOT DETECTED Final   Staphylococcus species DETECTED (A) NOT DETECTED Final    Comment: CRITICAL RESULT CALLED TO, READ BACK BY AND VERIFIED WITH: [name] COOKSON AT 2325 07/26/16.PMH    Staphylococcus aureus  DETECTED (A) NOT DETECTED Final    Comment: Methicillin (oxacillin)-resistant Staphylococcus aureus (MRSA). MRSA is predictably resistant to beta-lactam antibiotics (except ceftaroline). Preferred therapy is vancomycin unless clinically contraindicated. Patient requires contact precautions if  hospitalized. CRITICAL RESULT CALLED TO, READ BACK BY AND VERIFIED WITH: [name] COOKSON AT 2325 07/26/16.PMH    Methicillin resistance DETECTED (A) NOT DETECTED Final    Comment: CRITICAL RESULT CALLED TO, READ BACK BY AND VERIFIED WITH: [name] COOKSON AT 2325 07/26/16.PMH    Streptococcus species NOT DETECTED NOT DETECTED Final   Streptococcus agalactiae NOT DETECTED NOT DETECTED Final   Streptococcus pneumoniae NOT DETECTED NOT DETECTED Final   Streptococcus pyogenes NOT DETECTED NOT DETECTED Final   Acinetobacter baumannii NOT DETECTED NOT DETECTED Final   Enterobacteriaceae species NOT DETECTED NOT DETECTED Final   Enterobacter cloacae complex NOT DETECTED NOT DETECTED Final   Escherichia coli NOT DETECTED NOT DETECTED Final   Klebsiella oxytoca NOT DETECTED NOT DETECTED Final   Klebsiella pneumoniae NOT DETECTED NOT DETECTED Final   Proteus species NOT DETECTED NOT DETECTED Final   Serratia marcescens NOT DETECTED NOT DETECTED Final   Haemophilus influenzae NOT DETECTED NOT DETECTED Final   Neisseria meningitidis NOT DETECTED NOT DETECTED Final   Pseudomonas aeruginosa NOT DETECTED NOT DETECTED Final   Candida albicans NOT DETECTED NOT DETECTED Final   Candida glabrata NOT DETECTED NOT DETECTED Final   Candida krusei NOT DETECTED NOT DETECTED Final   Candida parapsilosis NOT DETECTED NOT DETECTED Final   Candida tropicalis NOT DETECTED NOT DETECTED Final  MRSA PCR Screening     Status: None   Collection Time: 07/27/16  1:10 AM  Result Value Ref Range Status   MRSA by PCR NEGATIVE NEGATIVE Final    Comment:        The GeneXpert MRSA Assay (FDA approved for NASAL specimens only), is one  component of a comprehensive MRSA colonization surveillance program. It is not intended to diagnose MRSA infection nor to guide or monitor treatment for MRSA infections.     Coagulation Studies:  Recent Labs  07/25/16 1306 07/26/16 0805  LABPROT 14.7 14.7  INR 1.14 1.14    Urinalysis: No results for input(s): COLORURINE, LABSPEC, PHURINE, GLUCOSEU, HGBUR, BILIRUBINUR, KETONESUR, PROTEINUR, UROBILINOGEN, NITRITE, LEUKOCYTESUR in the last 72 hours.  Invalid input(s): APPERANCEUR    Imaging: X-ray Chest Pa Or Ap  Result Date: 07/25/2016 CLINICAL DATA:  Preop vascular procedure EXAM: CHEST 1 VIEW COMPARISON:  05/21/2016 FINDINGS: Trace right pleural effusion. Bilateral mild interstitial thickening. No pneumothorax. No focal consolidation. Stable cardiomegaly. Dual-lumen right-sided central venous catheter in unchanged position. No acute osseous abnormality. IMPRESSION: Cardiomegaly with mild pulmonary vascular congestion. Electronically Signed   By: Kathreen Devoid  On: 07/25/2016 13:36   Dg Chest Port 1 View  Result Date: 07/26/2016 CLINICAL DATA:  Chest pain.  Dialysis patient. EXAM: PORTABLE CHEST 1 VIEW COMPARISON:  07/25/2016 FINDINGS: Central venous catheter tip remains in the lower SVC unchanged. Small right pleural effusion unchanged. Mild bibasilar atelectasis unchanged. Decreased lung volume. Negative for edema. IMPRESSION: Small right pleural effusion and mild right lower lobe atelectasis unchanged. Hypoventilation with decreased lung volume. Electronically Signed   By: Franchot Gallo M.D.   On: 07/26/2016 07:17     Medications:    . aspirin  325 mg Oral BH-q7a  . calcium acetate  667 mg Oral TID WC  . fluticasone  1 spray Each Nare Daily  . insulin aspart  0-9 Units Subcutaneous TID WC  . lidocaine  20 mL Intradermal Once  . midodrine  5 mg Oral Q M,W,F  . mometasone-formoterol  2 puff Inhalation BID  . multivitamin  1 tablet Oral QHS  . multivitamin with minerals   1 tablet Oral Daily  . pantoprazole  40 mg Oral Daily  . piperacillin-tazobactam (ZOSYN)  IV  3.375 g Intravenous Q12H  . polyethylene glycol  17 g Oral BH-q7a  . sevelamer carbonate  800 mg Oral Q breakfast  . simvastatin  20 mg Oral Daily  . sodium chloride flush  3 mL Intravenous Q12H  . tiotropium  18 mcg Inhalation Daily  . vancomycin  1,000 mg Intravenous Q M,W,F-HD     Assessment/ Plan:  [name] is a 61 y.o. black male [name] is a 61 y.o. black male with ESRD on hemodialysis, hypertension, hyperlipidemia, anemia, COPD, peripheral vascular disease, left BKA, right toe amputations  MWF [facility].   1.  End stage renal disease with hyperkalemia on admission. Emergent hemodialysis yesterday. No indication for dialysis today Complication of dialysis device. Tunnel infection. Catheter removed, he will need several catheter free days before a new catheter placed.  - vancomycin for MRSA in blood - Monitor daily for dialysis need.  - Appreciate Vascular input.   2.  Anemia of chronic kidney disease: hemoglobin 11.2 - EPO as outpatient.   3. Hypertension: blood pressure at goal.  - midodrine usually given before dialysis.   4. Secondary Hyperparathyroidism: - calcium acetate with meals.   5. Sepsis: blood cultures positive with MRSA.  Empiric antibiotics. Vancomycin.     LOS: [place], [place] 3/3/201812:57 PM

## 2016-07-27 NOTE — Progress Notes (Signed)
[facility]  SUBJECTIVE: I don't have chest pain   Vitals:   07/26/16 2027 07/27/16 0205 07/27/16 0629 07/27/16 0838  BP: 102/67 110/64 112/63 104/63  Pulse: 90 92 79 81  Resp: 18 18 18 20   Temp: 99 F (37.2 C) 98.9 F (37.2 C) 99 F (37.2 C) 98.3 F (36.8 C)  TempSrc: Oral Oral Oral Oral  SpO2: 100% 98% 95% 97%  Weight:      Height:         Intake/Output Summary (Last 24 hours) at 07/27/16 1128 Last data filed at 07/26/16 1900  Gross per 24 hour  Intake           301.95 ml  Output              856 ml  Net          -554.05 ml      PHYSICAL EXAM  General: Well developed, well nourished, in no acute distress HEENT:  Normocephalic and atramatic Neck:  No JVD.  Lungs: Clear bilaterally to auscultation and percussion. Heart: HRRR . Normal S1 and S2 without gallops or murmurs.  Abdomen: Bowel sounds are positive, abdomen soft and non-tender  Msk:  Back normal, normal gait. Normal strength and tone for age. Extremities: No clubbing, cyanosis or edema.   Neuro: Alert and oriented X 3. Psych:  Good affect, responds appropriately   LABS: Basic Metabolic Panel:  Recent Labs  07/26/16 0645 07/27/16 0139  NA 135 136  K 7.0* 5.1  CL 103 96*  CO2 19* 28  GLUCOSE 94 125*  BUN 91* 58*  CREATININE 12.17* 8.70*  CALCIUM 9.0 8.6*   Liver Function Tests: No results for input(s): AST, ALT, ALKPHOS, BILITOT, PROT, ALBUMIN in the last 72 hours. No results for input(s): LIPASE, AMYLASE in the last 72 hours. CBC:  Recent Labs  07/25/16 1306  07/26/16 0645 07/27/16 0139  WBC 12.5*  --  15.0* 16.5*  NEUTROABS 6.0  --   --   --   HGB 12.8*  < > 12.3* 11.2*  HCT 38.1*  < > 36.6* 33.8*  MCV 93.1  --  91.9 93.0  PLT 119*  --  117* 111*  < > = values in this interval not displayed. Cardiac Enzymes:  Recent Labs  07/26/16 0904 07/26/16 1710 07/26/16 2046  TROPONINI 2.30* 1.82* 1.74*   BNP: Invalid input(s): POCBNP D-Dimer: No results for input(s): DDIMER in  the last 72 hours. Hemoglobin A1C: No results for input(s): HGBA1C in the last 72 hours. Fasting Lipid Panel:  Recent Labs  07/26/16 0904  CHOL 131  HDL 45  LDLCALC 47  TRIG 196*  CHOLHDL 2.9   Thyroid Function Tests: No results for input(s): TSH, T4TOTAL, T3FREE, THYROIDAB in the last 72 hours.  Invalid input(s): FREET3 Anemia Panel: No results for input(s): VITAMINB12, FOLATE, FERRITIN, TIBC, IRON, RETICCTPCT in the last 72 hours.  X-ray Chest Pa Or Ap  Result Date: 07/25/2016 CLINICAL DATA:  Preop vascular procedure EXAM: CHEST 1 VIEW COMPARISON:  05/21/2016 FINDINGS: Trace right pleural effusion. Bilateral mild interstitial thickening. No pneumothorax. No focal consolidation. Stable cardiomegaly. Dual-lumen right-sided central venous catheter in unchanged position. No acute osseous abnormality. IMPRESSION: Cardiomegaly with mild pulmonary vascular congestion. Electronically Signed   By: Kathreen Devoid   On: 07/25/2016 13:36   Dg Chest Port 1 View  Result Date: 07/26/2016 CLINICAL DATA:  Chest pain.  Dialysis patient. EXAM: PORTABLE CHEST 1 VIEW COMPARISON:  07/25/2016 FINDINGS: Central venous catheter  tip remains in the lower SVC unchanged. Small right pleural effusion unchanged. Mild bibasilar atelectasis unchanged. Decreased lung volume. Negative for edema. IMPRESSION: Small right pleural effusion and mild right lower lobe atelectasis unchanged. Hypoventilation with decreased lung volume. Electronically Signed   By: Franchot Gallo M.D.   On: 07/26/2016 07:17     Echo   TELEMETRY: Sinus rhythm:  ASSESSMENT AND PLAN:  Principal Problem:   Sepsis ([place]) Active Problems:   Acute encephalopathy   Hyperkalemia   NSTEMI (non-ST elevated myocardial infarction) ([place])    1. Abnormal ECG, nondiagnostic ST elevation, in the absence of chest pain, with elevated troponin, likely demand supply ischemia 2. MRSA septicemia, due to dialysis catheter 3. End-stage renal disease, on  chronic hemodialysis  Recommendations  1. Agree with current therapy 2. Defer full dose anticoagulation 3. Defer cardiac catheterization in the absence of chest pain, in the setting of MRSA septicemia 4. Defer further cardiac diagnostics at this time. Consider further workup as outpatient with Dr. Nehemiah Massed.  Signed off for now, please call if any questions   Isaias Cowman, MD, PhD, [facility] 07/27/2016 11:28 AM

## 2016-07-27 NOTE — Consult Note (Signed)
Reason for Consult: Infected right IJ Permacath/Sepsis Referring Physician: Dr. Freddrick March [name] is an 61 y.o. male.  HPI: Patient with ESRD on HD via a right IJ Permacath. Now with sepsis and +blood cultures. Requires removal of catheter.  Past Medical History:  Diagnosis Date  . Anemia   . Asthma   . CHF (congestive heart failure) ([place])   . Chronic systolic heart failure ([place])   . Complication of anesthesia    hypotension  . COPD (chronic obstructive pulmonary disease) ([place])   . Coronary artery disease   . Dialysis patient ([place])    Mon, Wed, Fri  . End stage renal disease ([place])   . GERD (gastroesophageal reflux disease)   . Headache   . History of kidney stones   . HLD (hyperlipidemia)   . HTN (hypertension)   . Hyperparathyroidism   . Myocardial infarction   . Peripheral vascular disease ([place])   . Shortness of breath dyspnea   . Sleep apnea   . Tobacco dependence     Past Surgical History:  Procedure Laterality Date  . AMPUTATION Left 05/06/2014   Procedure: AMPUTATION BELOW KNEE;  Surgeon: Elam Dutch, MD;  Location: [facility];  Service: Vascular;  Laterality: Left;  . AMPUTATION Right 01/12/2015   Procedure: Foot transmetatarsal amputation;  Surgeon: Algernon Huxley, MD;  Location: [facility];  Service: Vascular;  Laterality: Right;  . APPLICATION OF WOUND VAC Right 03/01/2015   Procedure: Application of Bio-connekt graft and wound vac application to right foot ;  Surgeon: Algernon Huxley, MD;  Location: [facility];  Service: Vascular;  Laterality: Right;  . AV FISTULA PLACEMENT Left   . CARDIAC CATHETERIZATION     stent placement   . CORONARY ANGIOPLASTY    . LIGATION OF ARTERIOVENOUS  FISTULA Right 01/31/2016   Procedure: LIGATION OF ARTERIOVENOUS  FISTULA;  Surgeon: Algernon Huxley, MD;  Location: [facility];  Service: Vascular;  Laterality: Right;  . PERIPHERAL VASCULAR CATHETERIZATION Right 12/15/2014   Procedure: Lower Extremity Angiography;  Surgeon: Algernon Huxley, MD;   Location: [facility];  Service: Cardiovascular;  Laterality: Right;  . PERIPHERAL VASCULAR CATHETERIZATION  12/15/2014   Procedure: Lower Extremity Intervention;  Surgeon: Algernon Huxley, MD;  Location: [place] [facility];  Service: Cardiovascular;;  . PERIPHERAL VASCULAR CATHETERIZATION Right 08/14/2015   Procedure: A/V Shuntogram/Fistulagram;  Surgeon: Algernon Huxley, MD;  Location: [place] [facility];  Service: Cardiovascular;  Laterality: Right;  . PERIPHERAL VASCULAR CATHETERIZATION N/A 08/14/2015   Procedure: A/V Shunt Intervention;  Surgeon: Algernon Huxley, MD;  Location: [place] [facility];  Service: Cardiovascular;  Laterality: N/A;  . PERIPHERAL VASCULAR CATHETERIZATION N/A 01/11/2016   Procedure: Dialysis/Perma Catheter Insertion;  Surgeon: Algernon Huxley, MD;  Location: [place] [facility];  Service: Cardiovascular;  Laterality: N/A;  . REVISON OF ARTERIOVENOUS FISTULA Right 02/17/2016   Procedure: removal of AV fistula;  Surgeon: Serafina Mitchell, MD;  Location: [facility];  Service: Vascular;  Laterality: Right;  . REVISON OF ARTERIOVENOUS FISTULA Right 01/31/2016   Procedure: REVISON OF ARTERIOVENOUS FISTULA ( BRACHIOCEPHALIC ) W/ ARTEGRAFT;  Surgeon: Algernon Huxley, MD;  Location: [facility];  Service: Vascular;  Laterality: Right;  . TRANSMETATARSAL AMPUTATION Right 05/04/2015   Procedure: TRANSMETATARSAL AMPUTATION REVISION, great toe amputation;  Surgeon: Algernon Huxley, MD;  Location: [facility];  Service: Vascular;  Laterality: Right;    Family History  Problem Relation Age of Onset  .  Heart failure Other   . Hypertension Other   . Leukemia Other   . Diabetes Other     Social History:  reports that he has been smoking Cigarettes.  He has been smoking about 0.25 packs per day. He has never used smokeless tobacco. He reports that he does not drink alcohol or use drugs.  Allergies:  Allergies  Allergen Reactions  . Dust Mite Extract     Medications: I have reviewed the patient's  current medications.  Results for orders placed or performed during the hospital encounter of 07/26/16 (from the past 48 hour(s))  Basic metabolic panel     Status: Abnormal   Collection Time: 07/26/16  6:45 AM  Result Value Ref Range   Sodium 135 135 - 145 mmol/L   Potassium 7.0 (HH) 3.5 - 5.1 mmol/L    Comment: CRITICAL RESULT CALLED TO, READ BACK BY AND VERIFIED WITH FELICIA STAROPOLI AT 5456 07/26/16 DAS    Chloride 103 101 - 111 mmol/L   CO2 19 (L) 22 - 32 mmol/L   Glucose, Bld 94 65 - 99 mg/dL   BUN 91 (H) 6 - 20 mg/dL   Creatinine, Ser 12.17 (H) 0.61 - 1.24 mg/dL   Calcium 9.0 8.9 - 10.3 mg/dL   GFR calc non Af Amer 4 (L) >60 mL/min   GFR calc Af Amer 4 (L) >60 mL/min    Comment: (NOTE) The eGFR has been calculated using the CKD EPI equation. This calculation has not been validated in all clinical situations. eGFR's persistently <60 mL/min signify possible Chronic Kidney Disease.    Anion gap 13 5 - 15  CBC     Status: Abnormal   Collection Time: 07/26/16  6:45 AM  Result Value Ref Range   WBC 15.0 (H) 3.8 - 10.6 K/uL   RBC 3.98 (L) 4.40 - 5.90 MIL/uL   Hemoglobin 12.3 (L) 13.0 - 18.0 g/dL   HCT 36.6 (L) 40.0 - 52.0 %   MCV 91.9 80.0 - 100.0 fL   MCH 30.9 26.0 - 34.0 pg   MCHC 33.6 32.0 - 36.0 g/dL   RDW 17.0 (H) 11.5 - 14.5 %   Platelets 117 (L) 150 - 440 K/uL  Troponin I     Status: Abnormal   Collection Time: 07/26/16  6:45 AM  Result Value Ref Range   Troponin I 2.06 (HH) <0.03 ng/mL    Comment: CRITICAL RESULT CALLED TO, READ BACK BY AND VERIFIED WITH FELICIA STAROPOLI AT 2563 07/26/16 DAS   APTT     Status: None   Collection Time: 07/26/16  8:05 AM  Result Value Ref Range   aPTT 35 24 - 36 seconds  Protime-INR     Status: None   Collection Time: 07/26/16  8:05 AM  Result Value Ref Range   Prothrombin Time 14.7 11.4 - 15.2 seconds   INR 1.14   Lactic acid, plasma     Status: None   Collection Time: 07/26/16  8:05 AM  Result Value Ref Range   Lactic  Acid, Venous 1.5 0.5 - 1.9 mmol/L  Influenza panel by PCR (type A & B)     Status: None   Collection Time: 07/26/16  8:05 AM  Result Value Ref Range   Influenza A By PCR NEGATIVE NEGATIVE   Influenza B By PCR NEGATIVE NEGATIVE    Comment: (NOTE) The Xpert Xpress Flu assay is intended as an aid in the diagnosis of  influenza and should not be used as a  sole basis for treatment.  This  assay is FDA approved for nasopharyngeal swab specimens only. Nasal  washings and aspirates are unacceptable for Xpert Xpress Flu testing.   Blood Culture (routine x 2)     Status: None (Preliminary result)   Collection Time: 07/26/16  8:08 AM  Result Value Ref Range   Specimen Description BLOOD LEFT ANTECUBITAL    Special Requests BOTTLES DRAWN AEROBIC AND ANAEROBIC BCAV    Culture  Setup Time      GRAM POSITIVE COCCI ANAEROBIC BOTTLE ONLY CRITICAL VALUE NOTED.  VALUE IS CONSISTENT WITH PREVIOUSLY REPORTED AND CALLED VALUE.    Culture GRAM POSITIVE COCCI    Report Status PENDING   Troponin I     Status: Abnormal   Collection Time: 07/26/16  9:04 AM  Result Value Ref Range   Troponin I 2.30 (HH) <0.03 ng/mL    Comment: CRITICAL VALUE NOTED. VALUE IS CONSISTENT WITH PREVIOUSLY REPORTED/CALLED VALUE DAS  Lipid panel     Status: Abnormal   Collection Time: 07/26/16  9:04 AM  Result Value Ref Range   Cholesterol 131 0 - 200 mg/dL   Triglycerides 196 (H) <150 mg/dL   HDL 45 >40 mg/dL   Total CHOL/HDL Ratio 2.9 RATIO   VLDL 39 0 - 40 mg/dL   LDL Cholesterol 47 0 - 99 mg/dL    Comment:        Total Cholesterol/HDL:CHD Risk Coronary Heart Disease Risk Table                     Men   Women  1/2 Average Risk   3.4   3.3  Average Risk       5.0   4.4  2 X Average Risk   9.6   7.1  3 X Average Risk  23.4   11.0        Use the calculated Patient Ratio above and the CHD Risk Table to determine the patient's CHD Risk.        ATP III CLASSIFICATION (LDL):  <100     mg/dL   Optimal  100-129  mg/dL    Near or Above                    Optimal  130-159  mg/dL   Borderline  160-189  mg/dL   High  >190     mg/dL   Very High   Blood Culture (routine x 2)     Status: None (Preliminary result)   Collection Time: 07/26/16  9:57 AM  Result Value Ref Range   Specimen Description BLOOD LEFT ARM    Special Requests BOTTLES DRAWN AEROBIC AND ANAEROBIC BCHV    Culture  Setup Time      Organism ID to follow GRAM POSITIVE COCCI AEROBIC BOTTLE ONLY CRITICAL RESULT CALLED TO, READ BACK BY AND VERIFIED WITH: [name] COOKSON AT 2325 07/26/16.PMH    Culture GRAM POSITIVE COCCI    Report Status PENDING   Blood Culture ID Panel (Reflexed)     Status: Abnormal   Collection Time: 07/26/16  9:57 AM  Result Value Ref Range   Enterococcus species NOT DETECTED NOT DETECTED   Listeria monocytogenes NOT DETECTED NOT DETECTED   Staphylococcus species DETECTED (A) NOT DETECTED    Comment: CRITICAL RESULT CALLED TO, READ BACK BY AND VERIFIED WITH: [name] COOKSON AT 2325 07/26/16.PMH    Staphylococcus aureus DETECTED (A) NOT DETECTED    Comment: Methicillin (oxacillin)-resistant Staphylococcus aureus (  MRSA). MRSA is predictably resistant to beta-lactam antibiotics (except ceftaroline). Preferred therapy is vancomycin unless clinically contraindicated. Patient requires contact precautions if  hospitalized. CRITICAL RESULT CALLED TO, READ BACK BY AND VERIFIED WITH: [name] COOKSON AT 2325 07/26/16.PMH    Methicillin resistance DETECTED (A) NOT DETECTED    Comment: CRITICAL RESULT CALLED TO, READ BACK BY AND VERIFIED WITH: [name] COOKSON AT 2325 07/26/16.PMH    Streptococcus species NOT DETECTED NOT DETECTED   Streptococcus agalactiae NOT DETECTED NOT DETECTED   Streptococcus pneumoniae NOT DETECTED NOT DETECTED   Streptococcus pyogenes NOT DETECTED NOT DETECTED   Acinetobacter baumannii NOT DETECTED NOT DETECTED   Enterobacteriaceae species NOT DETECTED NOT DETECTED   Enterobacter cloacae complex NOT DETECTED NOT DETECTED    Escherichia coli NOT DETECTED NOT DETECTED   Klebsiella oxytoca NOT DETECTED NOT DETECTED   Klebsiella pneumoniae NOT DETECTED NOT DETECTED   Proteus species NOT DETECTED NOT DETECTED   Serratia marcescens NOT DETECTED NOT DETECTED   Haemophilus influenzae NOT DETECTED NOT DETECTED   Neisseria meningitidis NOT DETECTED NOT DETECTED   Pseudomonas aeruginosa NOT DETECTED NOT DETECTED   Candida albicans NOT DETECTED NOT DETECTED   Candida glabrata NOT DETECTED NOT DETECTED   Candida krusei NOT DETECTED NOT DETECTED   Candida parapsilosis NOT DETECTED NOT DETECTED   Candida tropicalis NOT DETECTED NOT DETECTED  Glucose, capillary     Status: None   Collection Time: 07/26/16 10:40 AM  Result Value Ref Range   Glucose-Capillary 65 65 - 99 mg/dL  Glucose, capillary     Status: None   Collection Time: 07/26/16  3:41 PM  Result Value Ref Range   Glucose-Capillary 78 65 - 99 mg/dL  Troponin I     Status: Abnormal   Collection Time: 07/26/16  5:10 PM  Result Value Ref Range   Troponin I 1.82 (HH) <0.03 ng/mL    Comment: CRITICAL VALUE NOTED. VALUE IS CONSISTENT WITH PREVIOUSLY REPORTED/CALLED VALUE JLJ  Heparin level (unfractionated)     Status: Abnormal   Collection Time: 07/26/16  5:10 PM  Result Value Ref Range   Heparin Unfractionated 0.25 (L) 0.30 - 0.70 IU/mL    Comment:        IF HEPARIN RESULTS ARE BELOW EXPECTED VALUES, AND PATIENT DOSAGE HAS BEEN CONFIRMED, SUGGEST FOLLOW UP TESTING OF ANTITHROMBIN III LEVELS.   Hepatitis B surface antigen     Status: None   Collection Time: 07/26/16  5:10 PM  Result Value Ref Range   Hepatitis B Surface Ag Negative Negative    Comment: (NOTE) Performed At: [facility] [place], [place] [id number] Lindon Romp MD PJ:[id number]   Hepatitis B surface antibody     Status: Abnormal   Collection Time: 07/26/16  5:10 PM  Result Value Ref Range   Hepatitis B-Post <3.1 (L) Immunity>9.9 mIU/mL    Comment: (NOTE)   Status of Immunity                     Anti-HBs Level  ------------------                     -------------- Inconsistent with Immunity                   0.0 - 9.9 Consistent with Immunity                          >9.9 Performed At: [facility]  [place], [place] [id number] Lindon Romp MD FT:[id number]   HIV antibody (Routine Testing)     Status: None   Collection Time: 07/26/16  5:10 PM  Result Value Ref Range   HIV Screen 4th Generation wRfx Non Reactive Non Reactive    Comment: (NOTE) Performed At: [facility] [address] [place], [place] [id number] Lindon Romp MD TD:[id number]   Glucose, capillary     Status: None   Collection Time: 07/26/16  5:19 PM  Result Value Ref Range   Glucose-Capillary 90 65 - 99 mg/dL  Troponin I     Status: Abnormal   Collection Time: 07/26/16  8:46 PM  Result Value Ref Range   Troponin I 1.74 (HH) <0.03 ng/mL    Comment: CRITICAL VALUE NOTED. VALUE IS CONSISTENT WITH PREVIOUSLY REPORTED/CALLED VALUE JLJ  Glucose, capillary     Status: Abnormal   Collection Time: 07/26/16  9:22 PM  Result Value Ref Range   Glucose-Capillary 107 (H) 65 - 99 mg/dL   Comment 1 Notify RN   MRSA PCR Screening     Status: None   Collection Time: 07/27/16  1:10 AM  Result Value Ref Range   MRSA by PCR NEGATIVE NEGATIVE    Comment:        The GeneXpert MRSA Assay (FDA approved for NASAL specimens only), is one component of a comprehensive MRSA colonization surveillance program. It is not intended to diagnose MRSA infection nor to guide or monitor treatment for MRSA infections.   Basic metabolic panel     Status: Abnormal   Collection Time: 07/27/16  1:39 AM  Result Value Ref Range   Sodium 136 135 - 145 mmol/L   Potassium 5.1 3.5 - 5.1 mmol/L   Chloride 96 (L) 101 - 111 mmol/L   CO2 28 22 - 32 mmol/L   Glucose, Bld 125 (H) 65 - 99 mg/dL   BUN 58 (H) 6 - 20 mg/dL   Creatinine, Ser 8.70 (H) 0.61 - 1.24 mg/dL    Calcium 8.6 (L) 8.9 - 10.3 mg/dL   GFR calc non Af Amer 6 (L) >60 mL/min   GFR calc Af Amer 7 (L) >60 mL/min    Comment: (NOTE) The eGFR has been calculated using the CKD EPI equation. This calculation has not been validated in all clinical situations. eGFR's persistently <60 mL/min signify possible Chronic Kidney Disease.    Anion gap 12 5 - 15  CBC     Status: Abnormal   Collection Time: 07/27/16  1:39 AM  Result Value Ref Range   WBC 16.5 (H) 3.8 - 10.6 K/uL   RBC 3.63 (L) 4.40 - 5.90 MIL/uL   Hemoglobin 11.2 (L) 13.0 - 18.0 g/dL   HCT 33.8 (L) 40.0 - 52.0 %   MCV 93.0 80.0 - 100.0 fL   MCH 30.7 26.0 - 34.0 pg   MCHC 33.1 32.0 - 36.0 g/dL   RDW 17.2 (H) 11.5 - 14.5 %   Platelets 111 (L) 150 - 440 K/uL  Heparin level (unfractionated)     Status: None   Collection Time: 07/27/16  1:39 AM  Result Value Ref Range   Heparin Unfractionated 0.31 0.30 - 0.70 IU/mL    Comment:        IF HEPARIN RESULTS ARE BELOW EXPECTED VALUES, AND PATIENT DOSAGE HAS BEEN CONFIRMED, SUGGEST FOLLOW UP TESTING OF ANTITHROMBIN III LEVELS.   Glucose, capillary     Status: None   Collection Time: 07/27/16  8:19 AM  Result Value Ref Range   Glucose-Capillary 91 65 - 99 mg/dL  Glucose, capillary     Status: Abnormal   Collection Time: 07/27/16 11:39 AM  Result Value Ref Range   Glucose-Capillary 125 (H) 65 - 99 mg/dL    X-ray Chest Pa Or Ap  Result Date: 07/25/2016 CLINICAL DATA:  Preop vascular procedure EXAM: CHEST 1 VIEW COMPARISON:  05/21/2016 FINDINGS: Trace right pleural effusion. Bilateral mild interstitial thickening. No pneumothorax. No focal consolidation. Stable cardiomegaly. Dual-lumen right-sided central venous catheter in unchanged position. No acute osseous abnormality. IMPRESSION: Cardiomegaly with mild pulmonary vascular congestion. Electronically Signed   By: Kathreen Devoid   On: 07/25/2016 13:36   Dg Chest Port 1 View  Result Date: 07/26/2016 CLINICAL DATA:  Chest pain.  Dialysis  patient. EXAM: PORTABLE CHEST 1 VIEW COMPARISON:  07/25/2016 FINDINGS: Central venous catheter tip remains in the lower SVC unchanged. Small right pleural effusion unchanged. Mild bibasilar atelectasis unchanged. Decreased lung volume. Negative for edema. IMPRESSION: Small right pleural effusion and mild right lower lobe atelectasis unchanged. Hypoventilation with decreased lung volume. Electronically Signed   By: Franchot Gallo M.D.   On: 07/26/2016 07:17    Review of Systems  Constitutional: Positive for fever.  HENT: Negative.   Eyes: Negative.   Respiratory: Negative for cough.   Cardiovascular: Negative for chest pain.  Gastrointestinal: Negative.   Genitourinary: Negative.   Musculoskeletal: Negative.   Skin: Negative.   All other systems reviewed and are negative.  Blood pressure 104/63, pulse 81, temperature 98.3 F (36.8 C), temperature source Oral, resp. rate 20, height 6' 6"  (1.981 m), weight 79.4 kg (175 lb), SpO2 97 %. Physical Exam  Constitutional: He appears well-developed.  Cardiovascular: Normal rate and regular rhythm.   Respiratory: Effort normal and breath sounds normal.    Assessment/Plan: Will remove Perm cath. Line holiday Placement of New catheter on Monday or Tuesday   Jamesetta So A 07/27/2016, 12:17 PM

## 2016-07-27 NOTE — Progress Notes (Signed)
ANTICOAGULATION CONSULT NOTE - Initial Consult  Pharmacy Consult for Heparin Drip Indication: chest pain/ACS  Allergies  Allergen Reactions  . Dust Mite Extract     Patient Measurements: Height: 6\' 6"  (198.1 cm) Weight: 175 lb (79.4 kg) IBW/kg (Calculated) : 91.4 Heparin Dosing Weight: 80.3kg  Vital Signs: Temp: 99 F (37.2 C) (03/02 2027) Temp Source: Oral (03/02 2027) BP: 102/67 (03/02 2027) Pulse Rate: 90 (03/02 2027)  Labs:  Recent Labs  07/25/16 1306 07/25/16 1359  07/26/16 0645 07/26/16 0805 07/26/16 0904 07/26/16 1710 07/26/16 2046 07/27/16 0139  HGB 12.8* 12.9*  --  12.3*  --   --   --   --  11.2*  HCT 38.1* 38.0*  --  36.6*  --   --   --   --  33.8*  PLT 119*  --   --  117*  --   --   --   --  111*  APTT 35  --   --   --  35  --   --   --   --   LABPROT 14.7  --   --   --  14.7  --   --   --   --   INR 1.14  --   --   --  1.14  --   --   --   --   HEPARINUNFRC  --   --   --   --   --   --  0.25*  --  0.31  CREATININE 10.31*  --   --  12.17*  --   --   --   --  8.70*  TROPONINI  --   --   < > 2.06*  --  2.30* 1.82* 1.74*  --   < > = values in this interval not displayed.  Estimated Creatinine Clearance: 9.9 mL/min (by C-G formula based on SCr of 8.7 mg/dL (H)).   Medical History: Past Medical History:  Diagnosis Date  . Anemia   . Asthma   . CHF (congestive heart failure) ([place])   . Chronic systolic heart failure ([place])   . Complication of anesthesia    hypotension  . COPD (chronic obstructive pulmonary disease) ([place])   . Coronary artery disease   . Dialysis patient ([place])    Mon, Wed, Fri  . End stage renal disease ([place])   . GERD (gastroesophageal reflux disease)   . Headache   . History of kidney stones   . HLD (hyperlipidemia)   . HTN (hypertension)   . Hyperparathyroidism   . Myocardial infarction   . Peripheral vascular disease ([place])   . Shortness of breath dyspnea   . Sleep apnea   . Tobacco dependence     Medications:   Scheduled:  . aspirin  325 mg Oral BH-q7a  . calcium acetate  667 mg Oral TID WC  . fluticasone  1 spray Each Nare Daily  . insulin aspart  0-9 Units Subcutaneous TID WC  . midodrine  5 mg Oral Q M,W,F  . mometasone-formoterol  2 puff Inhalation BID  . multivitamin  1 tablet Oral QHS  . multivitamin with minerals  1 tablet Oral Daily  . pantoprazole  40 mg Oral Daily  . piperacillin-tazobactam (ZOSYN)  IV  3.375 g Intravenous Q12H  . polyethylene glycol  17 g Oral BH-q7a  . sevelamer carbonate  800 mg Oral Q breakfast  . simvastatin  20 mg Oral Daily  . sodium chloride flush  3 mL Intravenous Q12H  . tiotropium  18 mcg Inhalation Daily  . vancomycin  1,000 mg Intravenous Q M,W,F-HD   Infusions:  . heparin 1,100 Units/hr (07/26/16 1829)    Assessment: 61 year old male who had a right-sided permacath placed just yesterday. Patient was supposed to go to dialysis this morning but was complained to EMS of chest pain. An EKG was done in the field and the patient was thought to be having a STEMI. Pharmacy consulted to dose and manage heparin drip.   Goal of Therapy:  Heparin level 0.3-0.7 units/ml Monitor platelets by anticoagulation protocol: Yes   Plan:  Give 4000 units bolus x 1 Start heparin infusion at 950 units/hr Check anti-Xa level in 8 hours and daily while on heparin Continue to monitor H&H and platelets   3/2 @ 1710 HL 0.25; subtherapeutic. Spoke with nurse about heparin running the whole time during dialysis and she confirmed. Will give heparin 1150 unit bolus x1 and increase heparin drip to 1100 units. Will recheck HL in 8hrs.    3/3 0139 HL therapeutic x 1. Continue current rate. Will recheck HL in 8 hours.  Laural Benes, Pharm.D., BCPS Clinical Pharmacist 07/27/2016,2:42 AM

## 2016-07-27 NOTE — Progress Notes (Signed)
Pharmacy Antibiotic Note  [name] is a 61 y.o. male admitted on 07/26/2016 with  sepsis.  Pharmacy has been consulted for Zosyn and Vancomcyin dosing. Patient received Zosyn 3.375 IV x 1 dose and Vancomycin 1gm IV x 1 dose. Patient has ESRD and W/HD on MWF.   Plan: Current orders for Vancomycin 1000mg  IV after each HD session. Has received 1 dose. Was given loading dose of 1000mg  x 1 on 3/2. Will give an additional dose of 500mg  IV x1 today to complete total loading dose of 1500mg . Vancomycin level ordered prior to 3rd HD session. Pre-HD goal:15-25 mcg/ml  Discussed Zosyn with MD - he would like to continue x 1 more day and re-evaluate. Continue zosyn 3.375gm IV Q12H.   Height: 6\' 6"  (198.1 cm) Weight: 175 lb (79.4 kg) IBW/kg (Calculated) : 91.4  Temp (24hrs), Avg:99.7 F (37.6 C), Min:98.3 F (36.8 C), Max:103 F (39.4 C)   Recent Labs Lab 07/25/16 1306 07/26/16 0645 07/26/16 0805 07/27/16 0139  WBC 12.5* 15.0*  --  16.5*  CREATININE 10.31* 12.17*  --  8.70*  LATICACIDVEN  --   --  1.5  --     Estimated Creatinine Clearance: 9.9 mL/min (by C-G formula based on SCr of 8.7 mg/dL (H)).    Allergies  Allergen Reactions  . Dust Mite Extract     Antimicrobials this admission: 3/2 Zosyn >>  3/2 Vancomycin >>   Dose adjustments this admission:   Microbiology results: 3/2 BCx: Sent  3/2 UCx: sent 3/2 MRSA PCR: sent   Thank you for allowing pharmacy to be a part of this patient's care.  Cheri Guppy, PharmD, BCPS Clinical Pharmacist 07/27/2016 1:41 PM

## 2016-07-27 NOTE — Progress Notes (Signed)
Rapid response was called due to AMS.  As per nurse, pt was completely alert and oriented in morning. Later in afternoon, family called nurse as pt was confused. He was also found hypoxic by nurse. And having fever.  I have seen pt, on oxygen via nasal canula now, SPO2 is stable. He is alert, and appears oriented now. Pupils b/l equal 3 mm, reactive to light. RS- no crep or wheezing. He have some shaking on arms, no tenderness on his cervical spines on local pressure. No focal weakness.  Assessment and plan  * Altered mental status     Get ABG, Likely the event was due to fever.    Tylenol for now.    CT head stat to r/o stroke or embolic event.  Pt is receiving vanc for MRSA bacteremia, he has HD catheter removed today.  Critical care time spent 30 min.

## 2016-07-27 NOTE — Progress Notes (Signed)
ID E note Notified by [facility] system of + MRSA blood culture Pt with HD cath as likely source.  Continue vancomycin  Agree with removal of cath.  Would repeat bcx and check Echo  Consider MRI C spine if continues to have neck pain  WIll see Monday - please call with questions

## 2016-07-27 NOTE — NC FL2 (Signed)
[place] LEVEL OF CARE SCREENING TOOL     IDENTIFICATION  Patient Name: [name] Birthdate: [birth year] Sex: male Admission Date (Current Location): 07/26/2016  [place] and [place] Number:  Engineering geologist and Address:  [facility], [address], [place], [place] [address]      Provider Number: [id number]  Attending Physician Name and Address:  Loletha Grayer, MD  Relative Name and Phone Number:       Current Level of Care: Hospital Recommended Level of Care: [place] Prior Approval Number:    Date Approved/Denied:   PASRR Number: [id number]  Discharge Plan: SNF    Current Diagnoses: Patient Active Problem List   Diagnosis Date Noted  . NSTEMI (non-ST elevated myocardial infarction) ([place]) 07/26/2016  . Acute respiratory failure with hypoxia ([place]) 05/24/2016  . Right lower lobe pneumonia ([place]) 05/24/2016  . Elevated troponin 05/24/2016  . Pulmonary edema 05/21/2016  . Kidney dialysis as the cause of abnormal reaction of the patient, or of later complication, without mention of misadventure at the time of the procedure (CODE) 03/05/2016  . PVD (peripheral vascular disease) ([place]) 03/05/2016  . Preoperative cardiovascular examination 05/02/2015  . Chronic systolic heart failure ([place])   . Sepsis ([place]) 01/13/2015  . Acute encephalopathy 01/13/2015  . Hyperkalemia 01/13/2015  . Gangrene of foot ([place]) 05/04/2014  . ESRD (end stage renal disease) on dialysis ([place]) 05/04/2014  . CAD (coronary artery disease) 05/04/2014  . Normocytic anemia 05/04/2014  . Hyperlipidemia 04/26/2010  . HYPERTENSION, BENIGN 04/26/2010  . CAD, NATIVE VESSEL 04/26/2010  . End stage renal disease ([place]) 04/26/2010    Orientation RESPIRATION BLADDER Height & Weight     Self, Time, Situation, Place  Normal Continent Weight: 175 lb (79.4 kg) Height:  6\' 6"  (198.1 cm)  BEHAVIORAL SYMPTOMS/MOOD NEUROLOGICAL BOWEL NUTRITION STATUS       Continent Diet (Renal)  AMBULATORY STATUS COMMUNICATION OF NEEDS Skin   Independent Verbally Surgical wounds                       Personal Care Assistance Level of Assistance  Bathing, Feeding, Dressing Bathing Assistance: Limited assistance Feeding assistance: Independent Dressing Assistance: Limited assistance     Functional Limitations Info             SPECIAL CARE FACTORS FREQUENCY                       Contractures Contractures Info: Present    Additional Factors Info  Code Status, Allergies Code Status Info: DNR Allergies Info: Dust Mite Extract           Current Medications (07/27/2016):  This is the current hospital active medication list Current Facility-Administered Medications  Medication Dose Route Frequency Provider Last Rate Last Dose  . acetaminophen (TYLENOL) suppository 650 mg  650 mg Rectal Q6H PRN Vaughan Basta, MD   650 mg at 07/26/16 1800  . aspirin tablet 325 mg  325 mg Oral Martin Majestic, MD   325 mg at 07/27/16 0620  . calcium acetate (PHOSLO) capsule 667 mg  667 mg Oral TID WC Vaughan Basta, MD   667 mg at 07/27/16 1222  . docusate sodium (COLACE) capsule 100 mg  100 mg Oral BID PRN Vaughan Basta, MD      . fluticasone (FLONASE) 50 MCG/ACT nasal spray 1 spray  1 spray Each Nare Daily Vaughan Basta, MD   1 spray at 07/27/16  1042  . insulin aspart (novoLOG) injection 0-9 Units  0-9 Units Subcutaneous TID WC Vaughan Basta, MD   1 Units at 07/27/16 1222  . ipratropium-albuterol (DUONEB) 0.5-2.5 (3) MG/3ML nebulizer solution 3 mL  3 mL Nebulization Q4H PRN Vaughan Basta, MD      . midodrine (PROAMATINE) tablet 5 mg  5 mg Oral Q M,W,F Vaughan Basta, MD      . mometasone-formoterol (DULERA) 200-5 MCG/ACT inhaler 2 puff  2 puff Inhalation BID Vaughan Basta, MD   2 puff at 07/27/16 0834  . multivitamin (RENA-VIT) tablet 1 tablet  1 tablet Oral QHS Vaughan Basta, MD   1 tablet at 07/26/16 2148  . multivitamin with minerals tablet 1 tablet  1 tablet Oral Daily Vaughan Basta, MD   1 tablet at 07/27/16 1040  . oxyCODONE (Oxy IR/ROXICODONE) immediate release tablet 5 mg  5 mg Oral Q8H PRN Vaughan Basta, MD      . pantoprazole (PROTONIX) EC tablet 40 mg  40 mg Oral Daily Vaughan Basta, MD   40 mg at 07/27/16 1040  . piperacillin-tazobactam (ZOSYN) IVPB 3.375 g  3.375 g Intravenous Q12H Sheema M Hallaji, RPH   3.375 g at 07/27/16 1040  . polyethylene glycol (MIRALAX / GLYCOLAX) packet 17 g  17 g Oral BH-q7a Vaughan Basta, MD      . sevelamer carbonate (RENVELA) tablet 800 mg  800 mg Oral Q breakfast Vaughan Basta, MD      . simvastatin (ZOCOR) tablet 20 mg  20 mg Oral Daily Vaughan Basta, MD   20 mg at 07/27/16 1040  . sodium chloride flush (NS) 0.9 % injection 3 mL  3 mL Intravenous Q12H Vaughan Basta, MD   3 mL at 07/27/16 1040  . tiotropium (SPIRIVA) inhalation capsule 18 mcg  18 mcg Inhalation Daily Vaughan Basta, MD   18 mcg at 07/26/16 2148  . vancomycin (VANCOCIN) 500 mg in sodium chloride 0.9 % 100 mL IVPB  500 mg Intravenous Once Loletha Grayer, MD   500 mg at 07/27/16 1530  . vancomycin (VANCOCIN) IVPB 1000 mg/200 mL premix  1,000 mg Intravenous Q M,W,F-HD Sheema M Hallaji, RPH 200 mL/hr at 07/26/16 1343 1,000 mg at 07/26/16 1343     Discharge Medications: Please see discharge summary for a list of discharge medications.  Relevant Imaging Results:  Relevant Lab Results:   Additional Information SS# [id number]  Zettie Pho, LCSW

## 2016-07-27 NOTE — Clinical Social Work Note (Addendum)
Clinical Social Work Assessment  Patient Details  Name: [name] MRN: [id number] Date of Birth: 17-Mar-1956  Date of referral:  07/27/16               Reason for consult:  Facility Placement                Permission sought to share information with:  Facility Art therapist granted to share information::  Yes, Verbal Permission Granted  Name::        Agency::     Relationship::     Contact Information:     Housing/Transportation Living arrangements for the past 2 months:  [place] of Information:  Patient Patient Interpreter Needed:  None Criminal Activity/Legal Involvement Pertinent to Current Situation/Hospitalization:  No - Comment as needed Significant Relationships:  Siblings Lives with:  Facility Resident Do you feel safe going back to the place where you live?  Yes Need for family participation in patient care:  No (Coment)  Care giving concerns:  Patient admitted from [place] Worker assessment / plan:  CSW met with patient at bedside to discuss discharge planning. The patient is a LTC resident at [facility], and he plans to return there when stable. He did say that he would like to find different housing in the future, but he feels that his past jail time is a barrier. CSW advised the patient that she would alert the primary CSW to visit him on 3/5 for further assistance.   The patient receives Dialysis M,W,F from [facility] through [facility] nephrology. According to [employer] from [facility], the patient can return when stable.  Employment status:  Retired Forensic scientist:  Medicare PT Recommendations:  Not assessed at this time Information / Referral to community resources:     Patient/Family's Response to care:  Patient thanked CSW for assistance.  Patient/Family's Understanding of and Emotional Response to Diagnosis, Current Treatment, and Prognosis:  The patient is in agreement with the dc  plan.  Emotional Assessment Appearance:  Appears stated age Attitude/Demeanor/Rapport:   (Pleasant) Affect (typically observed):  Accepting, Appropriate, Pleasant Orientation:  Oriented to Self, Oriented to Place, Oriented to  Time, Oriented to Situation Alcohol / Substance use:  Never Used Psych involvement (Current and /or in the community):  No (Comment)  Discharge Needs  Concerns to be addressed:  Care Coordination Readmission within the last 30 days:  No Current discharge risk:  Chronically ill Barriers to Discharge:  Continued Medical Work up   [employer], LCSW 07/27/2016, 3:27 PM

## 2016-07-27 NOTE — Progress Notes (Signed)
Rapid response called - patient's mental status changed dramatically.  MD notified. ABG ordered. CT head ordered. Oxygen via [place] placed with improvement. Lauris Poag, RN 07/27/16 [phone]

## 2016-07-28 LAB — GLUCOSE, CAPILLARY
GLUCOSE-CAPILLARY: 84 mg/dL (ref 65–99)
GLUCOSE-CAPILLARY: 90 mg/dL (ref 65–99)
Glucose-Capillary: 103 mg/dL — ABNORMAL HIGH (ref 65–99)
Glucose-Capillary: 98 mg/dL (ref 65–99)

## 2016-07-28 MED ORDER — HEPARIN SODIUM (PORCINE) 5000 UNIT/ML IJ SOLN
5000.0000 [IU] | Freq: Three times a day (TID) | INTRAMUSCULAR | Status: DC
  Administered 2016-07-28 – 2016-07-31 (×8): 5000 [IU] via SUBCUTANEOUS
  Filled 2016-07-28 (×8): qty 1

## 2016-07-28 MED ORDER — VANCOMYCIN HCL IN DEXTROSE 750-5 MG/150ML-% IV SOLN
750.0000 mg | INTRAVENOUS | Status: DC | PRN
  Administered 2016-07-29: 750 mg via INTRAVENOUS
  Filled 2016-07-28 (×3): qty 150

## 2016-07-28 NOTE — Evaluation (Signed)
Physical Therapy Evaluation Patient Details Name: [name] MRN: [id number] DOB: [birth year] Today's Date: 07/28/2016   History of Present Illness  [name]  is a 61 y.o. male with a known history of CHF with ejection fraction 20%, COPD, coronary artery disease status post and, end-stage renal disease on hemodialysis on Monday Wednesday and Friday, hyperlipidemia, hypertension, diabetes- had a vascular procedure of changing his permacath done yesterday. While in dialysis on Friday he started feeling weakness and was having chest pain. Patient was brought to the ED; He was diagnosed with sepsis;   Clinical Impression  61 yo Male came to ED with weakness and chest pain; Patient has ESRD and is on dialysis 3x a week; He recently had permacath changed; Patient is a LLE BKA; He did not have his prosthetic at time of PT evaluation. Patient reports that he is able to get around well with walker with prosthetic. He also has a wheelchair for mobility; Patient is currently supervision bed mobility; He required min A for sit to stand and supervision for stand pivot; He was able to use a RW and hop on RLE to bedside chair. Patient's sister reports that she will bring prosthetic for further gait assessment. Patient demonstrates fair strength in RLE and good strength in LLE. He would benefit from additional skilled PT intervention to improve strength, balance and gait safety;      Follow Up Recommendations SNF;Supervision - Intermittent    Equipment Recommendations  None recommended by PT    Recommendations for Other Services       Precautions / Restrictions Precautions Precautions: Fall Restrictions Weight Bearing Restrictions: No Other Position/Activity Restrictions: LLE BKA; has prosthetic at [facility]; sisters will bring to hospital for ambulation;       Mobility  Bed Mobility Overal bed mobility: Needs Assistance Bed Mobility: Supine to Sit     Supine to sit: Supervision      General bed mobility comments: uses bed rails and elevated head of bed; required cues for hand placement;   Transfers Overall transfer level: Needs assistance Equipment used: Rolling walker (2 wheeled) Transfers: Sit to/from [facility] Sit to Stand: Min assist Stand pivot transfers: Supervision       General transfer comment: requires min A for getting into stand position from elevated bed with cues for hand placement and to increase RLE knee extension; able to hop to bedside chair with supervision with RW;   Ambulation/Gait             General Gait Details: unable at this time as patient does not have prosthesis;   Stairs            Wheelchair Mobility    Modified Rankin (Stroke Patients Only)       Balance Overall balance assessment: Needs assistance Sitting-balance support: No upper extremity supported;Feet supported Sitting balance-Leahy Scale: Good     Standing balance support: Bilateral upper extremity supported Standing balance-Leahy Scale: Fair Standing balance comment: close supervision to CGA with dynamic standing balance;                              Pertinent Vitals/Pain Pain Assessment: No/denies pain    Home Living Family/patient expects to be discharged to:: Skilled nursing facility [facility]; )                 Additional Comments: patient has been living at [facility] for last 7-8 months;  Prior Function Level of Independence: Independent with assistive device(s)         Comments: has LLE BKA with prosthetic; reports being able to walk with a walker; patient also has a manual and power wheelchair for community mobility; did need assistance with bathing but patient able to dress and transfer mod I;      Hand Dominance   Dominant Hand: Right    Extremity/Trunk Assessment   Upper Extremity Assessment Upper Extremity Assessment: Overall WFL for tasks assessed    Lower  Extremity Assessment Lower Extremity Assessment: RLE deficits/detail;LLE deficits/detail RLE Deficits / Details: grossly 3+/5, intact light touch sensation, has transmetatarsal amputation;  LLE Deficits / Details: grossly 4/5, BKA    Cervical / Trunk Assessment Cervical / Trunk Assessment: Normal  Communication   Communication: No difficulties  Cognition Arousal/Alertness: Awake/alert Behavior During Therapy: WFL for tasks assessed/performed Overall Cognitive Status: Within Functional Limits for tasks assessed                      General Comments      Exercises Other Exercises Other Exercises: Instructed patient to increase HEP compliance with heel slide, hip abduction/adduction, SLR, quad sets; Instructed patient to increase hold time and ROM for better strengthening; also educated patient to do exercise at least 1x an hour for increased flexibility; patient has done these exercise before and was able to show independence (x8 min)   Assessment/Plan    PT Assessment Patient needs continued PT services  PT Problem List Decreased strength;Decreased mobility;Decreased safety awareness;Decreased activity tolerance;Cardiopulmonary status limiting activity;Decreased balance       PT Treatment Interventions Gait training;Functional mobility training;Balance training;Therapeutic exercise;Therapeutic activities;Patient/family education    PT Goals (Current goals can be found in the Care Plan section)  Acute Rehab PT Goals Patient Stated Goal: "I want to get stronger."  PT Goal Formulation: With patient Time For Goal Achievement: 08/11/16 Potential to Achieve Goals: Good    Frequency Min 2X/week   Barriers to discharge   NA    Co-evaluation               End of Session Equipment Utilized During Treatment: Gait belt;Oxygen Activity Tolerance: Patient tolerated treatment well Patient left: in chair;with call bell/phone within reach;with family/visitor present;with  chair alarm set Nurse Communication: Mobility status PT Visit Diagnosis: Unsteadiness on feet (R26.81);Muscle weakness (generalized) (M62.81)         Time: 5009-3818 PT Time Calculation (min) (ACUTE ONLY): 40 min   Charges:   PT Evaluation $PT Eval Moderate Complexity: 1 Procedure PT Treatments $Therapeutic Exercise: 8-22 mins   PT G Codes:         Neima Lacross PT, DPT 07/28/2016, 3:07 PM

## 2016-07-28 NOTE — Progress Notes (Signed)
Pharmacy Antibiotic Note  [name] is a 61 y.o. male admitted on 07/26/2016 with  sepsis.  Pharmacy has been consulted for  Vancomcyin dosing. Patient has ESRD and W/HD on MWF.   HD Permcath removed 3/3  Plan: HD Permcath removed 3/3 due to bacteremia. Nephrology to evaluate for HD on a daily basis.  Pt has received a total of 1500mg  of vancomycin as loading dose and 1000mg  with HD on 3/2. Based on weight, will order vancomycin 750mg  IV QHD. Need to follow closely for HD schedule. Check trough prior to 3rd HD session. Target pre-HD goal: 15-51mcg/ml   Height: 6\' 6"  (198.1 cm) Weight: 175 lb (79.4 kg) IBW/kg (Calculated) : 91.4  Temp (24hrs), Avg:99.8 F (37.7 C), Min:98.1 F (36.7 C), Max:102.3 F (39.1 C)   Recent Labs Lab 07/25/16 1306 07/26/16 0645 07/26/16 0805 07/27/16 0139  WBC 12.5* 15.0*  --  16.5*  CREATININE 10.31* 12.17*  --  8.70*  LATICACIDVEN  --   --  1.5  --     Estimated Creatinine Clearance: 9.9 mL/min (by C-G formula based on SCr of 8.7 mg/dL (H)).    Allergies  Allergen Reactions  . Dust Mite Extract     Antimicrobials this admission: 3/2 Zosyn >> 3/3 3/2 Vancomycin >>   Dose adjustments this admission:   Microbiology results: 3/3 Cath tip: pend 3/2 BCx: GPC x 2 sets, BCID Staph Aureus, MecA + 3/2 UCx: sent 3/2 MRSA PCR: sent   Thank you for allowing pharmacy to be a part of this patient's care.  Cheri Guppy, PharmD, BCPS Clinical Pharmacist 07/28/2016 1:31 PM

## 2016-07-28 NOTE — Progress Notes (Signed)
Patient ID: [name], male   DOB: 1955-12-11, 61 y.o.   MRN: [id number]   [facility] PROGRESS NOTE  PANAGIOTIS OELKERS XAJ:[id number] DOB: [birth year] DOA: 07/26/2016 PCP: [place]  HPI/Subjective: Patient offers no complaints. Last night a rapid response was called because e had altered mental status. The nurse found him to him on oxygen. He also had a fever of 102 at that time. The patient feels well at this time. No complaints of any neck pain today. Able to move his neck around pretty well.  Objective: Vitals:   07/27/16 2322 07/28/16 0621  BP: 101/62 108/82  Pulse: 98 92  Resp: 15 18  Temp: 99.6 F (37.6 C) 98.8 F (37.1 C)    Filed Weights   07/26/16 1726  Weight: 79.4 kg (175 lb)    ROS: Review of Systems  Constitutional: Negative for chills and fever.  Eyes: Negative for blurred vision.  Respiratory: Negative for cough and shortness of breath.   Cardiovascular: Negative for chest pain.  Gastrointestinal: Negative for abdominal pain, constipation, diarrhea, nausea and vomiting.  Genitourinary: Negative for dysuria.  Musculoskeletal: Negative for joint pain and neck pain.  Neurological: Negative for dizziness and headaches.   Exam: Physical Exam  Constitutional: He is oriented to person, place, and time.  HENT:  Nose: No mucosal edema.  Mouth/Throat: No oropharyngeal exudate or posterior oropharyngeal edema.  Eyes: Conjunctivae, EOM and lids are normal. Pupils are equal, round, and reactive to light.  Neck: No JVD present. Carotid bruit is not present. No edema present. No thyroid mass and no thyromegaly present.  Cardiovascular: S1 normal and S2 normal.  Exam reveals no gallop.   Murmur heard.  Systolic murmur is present with a grade of 2/6  Pulses:      Dorsalis pedis pulses are 2+ on the right side, and 2+ on the left side.  Respiratory: No respiratory distress. He has no wheezes. He has no rhonchi. He has no rales.  GI: Soft. Bowel  sounds are normal. There is no tenderness.  Musculoskeletal:       Right ankle: He exhibits no swelling.  Lymphadenopathy:    He has no cervical adenopathy.  Neurological: He is alert and oriented to person, place, and time. No cranial nerve deficit.  Skin: Skin is warm. No rash noted. Nails show no clubbing.  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      Data Reviewed: Basic Metabolic Panel:  Recent Labs Lab 07/25/16 1306 07/25/16 1359 07/26/16 0645 07/27/16 0139  NA 137 137 135 136  K 5.6* 5.8* 7.0* 5.1  CL 100*  --  103 96*  CO2 23  --  19* 28  GLUCOSE 95 91 94 125*  BUN 70*  --  91* 58*  CREATININE 10.31*  --  12.17* 8.70*  CALCIUM 9.4  --  9.0 8.6*   CBC:  Recent Labs Lab 07/25/16 1306 07/25/16 1359 07/26/16 0645 07/27/16 0139  WBC 12.5*  --  15.0* 16.5*  NEUTROABS 6.0  --   --   --   HGB 12.8* 12.9* 12.3* 11.2*  HCT 38.1* 38.0* 36.6* 33.8*  MCV 93.1  --  91.9 93.0  PLT 119*  --  117* 111*   Cardiac Enzymes:  Recent Labs Lab 07/26/16 0645 07/26/16 0904 07/26/16 1710 07/26/16 2046  TROPONINI 2.06* 2.30* 1.82* 1.74*   BNP (last 3 results)  Recent Labs  05/21/16 1954  BNP 3,452.0*     CBG:  Recent Labs Lab 07/27/16 0819 07/27/16 1139 07/27/16 1636 07/27/16 2102 07/28/16 0740  GLUCAP 91 125* 87 122* 84    Recent Results (from the past 240 hour(s))  Surgical pcr screen     Status: None   Collection Time: 07/25/16  1:03 PM  Result Value Ref Range Status   MRSA, PCR NEGATIVE NEGATIVE Final   Staphylococcus aureus NEGATIVE NEGATIVE Final    Comment:        The Xpert SA Assay (FDA approved for NASAL specimens in patients over 45 years of age), is one component of a comprehensive surveillance program.  Test performance has been validated by [facility] for patients greater than or equal to 78 year old. It is not intended to diagnose infection nor to guide or monitor treatment.    Blood Culture (routine x 2)     Status: None (Preliminary result)   Collection Time: 07/26/16  8:08 AM  Result Value Ref Range Status   Specimen Description BLOOD LEFT ANTECUBITAL  Final   Special Requests BOTTLES DRAWN AEROBIC AND ANAEROBIC BCAV  Final   Culture  Setup Time   Final    GRAM POSITIVE COCCI ANAEROBIC BOTTLE ONLY CRITICAL VALUE NOTED.  VALUE IS CONSISTENT WITH PREVIOUSLY REPORTED AND CALLED VALUE.    Culture GRAM POSITIVE COCCI  Final   Report Status PENDING  Incomplete  Blood Culture (routine x 2)     Status: None (Preliminary result)   Collection Time: 07/26/16  9:57 AM  Result Value Ref Range Status   Specimen Description BLOOD LEFT ARM  Final   Special Requests BOTTLES DRAWN AEROBIC AND ANAEROBIC BCHV  Final   Culture  Setup Time   Final    Organism ID to follow GRAM POSITIVE COCCI AEROBIC BOTTLE ONLY CRITICAL RESULT CALLED TO, READ BACK BY AND VERIFIED WITH: [name] COOKSON AT 2325 07/26/16.PMH    Culture GRAM POSITIVE COCCI  Final   Report Status PENDING  Incomplete  Blood Culture ID Panel (Reflexed)     Status: Abnormal   Collection Time: 07/26/16  9:57 AM  Result Value Ref Range Status   Enterococcus species NOT DETECTED NOT DETECTED Final   Listeria monocytogenes NOT DETECTED NOT DETECTED Final   Staphylococcus species DETECTED (A) NOT DETECTED Final    Comment: CRITICAL RESULT CALLED TO, READ BACK BY AND VERIFIED WITH: [name] COOKSON AT 2325 07/26/16.PMH    Staphylococcus aureus DETECTED (A) NOT DETECTED Final    Comment: Methicillin (oxacillin)-resistant Staphylococcus aureus (MRSA). MRSA is predictably resistant to beta-lactam antibiotics (except ceftaroline). Preferred therapy is vancomycin unless clinically contraindicated. Patient requires contact precautions if  hospitalized. CRITICAL RESULT CALLED TO, READ BACK BY AND VERIFIED WITH: [name] COOKSON AT 2325 07/26/16.PMH    Methicillin resistance DETECTED (A) NOT DETECTED Final    Comment: CRITICAL RESULT  CALLED TO, READ BACK BY AND VERIFIED WITH: [name] COOKSON AT 2325 07/26/16.PMH    Streptococcus species NOT DETECTED NOT DETECTED Final   Streptococcus agalactiae NOT DETECTED NOT DETECTED Final   Streptococcus pneumoniae NOT DETECTED NOT DETECTED Final   Streptococcus pyogenes NOT DETECTED NOT DETECTED Final   Acinetobacter baumannii NOT DETECTED NOT DETECTED Final   Enterobacteriaceae species NOT DETECTED NOT DETECTED Final   Enterobacter cloacae complex NOT DETECTED NOT DETECTED Final   Escherichia coli NOT DETECTED NOT DETECTED Final   Klebsiella oxytoca NOT DETECTED NOT DETECTED Final   Klebsiella pneumoniae NOT DETECTED NOT DETECTED Final   Proteus species NOT DETECTED  NOT DETECTED Final   Serratia marcescens NOT DETECTED NOT DETECTED Final   Haemophilus influenzae NOT DETECTED NOT DETECTED Final   Neisseria meningitidis NOT DETECTED NOT DETECTED Final   Pseudomonas aeruginosa NOT DETECTED NOT DETECTED Final   Candida albicans NOT DETECTED NOT DETECTED Final   Candida glabrata NOT DETECTED NOT DETECTED Final   Candida krusei NOT DETECTED NOT DETECTED Final   Candida parapsilosis NOT DETECTED NOT DETECTED Final   Candida tropicalis NOT DETECTED NOT DETECTED Final  MRSA PCR Screening     Status: None   Collection Time: 07/27/16  1:10 AM  Result Value Ref Range Status   MRSA by PCR NEGATIVE NEGATIVE Final    Comment:        The GeneXpert MRSA Assay (FDA approved for NASAL specimens only), is one component of a comprehensive MRSA colonization surveillance program. It is not intended to diagnose MRSA infection nor to guide or monitor treatment for MRSA infections.      Studies: Ct Head Wo Contrast  Result Date: 07/27/2016 CLINICAL DATA:  Confusion, fever. EXAM: CT HEAD WITHOUT CONTRAST TECHNIQUE: Contiguous axial images were obtained from the base of the skull through the vertex without intravenous contrast. COMPARISON:  None. FINDINGS: Brain: There is generalized parenchymal  atrophy with commensurate dilatation of the ventricles and sulci. Mild chronic small vessel ischemic change noted within the periventricular white matter regions bilaterally. There is no mass, hemorrhage, edema or other evidence of acute parenchymal abnormality. No extra-axial hemorrhage. Vascular: There are chronic calcified atherosclerotic changes of the large vessels at the skull base. No unexpected hyperdense vessel. Skull: Normal. Negative for fracture or focal lesion. Sinuses/Orbits: Paranasal sinuses are clear. Periorbital and retro-orbital soft tissues are unremarkable. Other: None. IMPRESSION: 1. No acute findings.  No intracranial mass, hemorrhage or edema. 2. Atrophy and mild chronic small vessel ischemic changes in the periventricular white matter. Electronically Signed   By: Franki Cabot M.D.   On: 07/27/2016 18:21    Scheduled Meds: . aspirin  325 mg Oral BH-q7a  . calcium acetate  667 mg Oral TID WC  . Chlorhexidine Gluconate Cloth  6 each Topical Q0600  . fluticasone  1 spray Each Nare Daily  . insulin aspart  0-9 Units Subcutaneous TID WC  . midodrine  5 mg Oral Q M,W,F  . mometasone-formoterol  2 puff Inhalation BID  . multivitamin  1 tablet Oral QHS  . multivitamin with minerals  1 tablet Oral Daily  . mupirocin ointment  1 application Nasal BID  . pantoprazole  40 mg Oral Daily  . polyethylene glycol  17 g Oral BH-q7a  . sevelamer carbonate  800 mg Oral Q breakfast  . simvastatin  20 mg Oral Daily  . sodium chloride flush  3 mL Intravenous Q12H  . tiotropium  18 mcg Inhalation Daily  . vancomycin  1,000 mg Intravenous Q M,W,F-HD    Assessment/Plan:  1. MRSA sepsis. Source likely dialysis catheter. Dialysis catheter removed yesterdayby vascular surgery.  Continue vancomycin dosed by pharmacy.  Repeat blood cultures ordered for this afternoon. I will discuss case with infectious disease TEE needs to be done. TTE did not comment on vegetation. 2. Acute encephalopathy last  night again likely secondary to sepsis, fever and hypoxia 3. Acute respiratory failure with hypoxia. Continue oxygen supplementation. Hopefully can taper off oxygen today 4. Severe hyperkalemia improved with dialysis.  5. Hyperlipidemia unspecified on simvastatin 6. GERD on Protonix 7. Relative hypotension continue to hold antihypertensive medications 8. End-stage renal dissease.  Normal days are Monday Wednesday Friday. Had dialysis Friday. Case discussed with nephrology.  Potentially can get a new catheter on Monday. 9. Cardiomyopathy seen on echocardiogram. No change from previous  Code Status:     Code Status Orders        Start     Ordered   07/26/16 1708  Do not attempt resuscitation (DNR)  Continuous    Question Answer Comment  In the event of cardiac or respiratory ARREST Do not call a "code blue"   In the event of cardiac or respiratory ARREST Do not perform Intubation, CPR, defibrillation or ACLS   In the event of cardiac or respiratory ARREST Use medication by any route, position, wound care, and other measures to relive pain and suffering. May use oxygen, suction and manual treatment of airway obstruction as needed for comfort.   Comments confirmed with pt's sister in room at admission.      07/26/16 1707    Code Status History    Date Active Date Inactive Code Status Order ID Comments User Context   05/22/2016 12:34 AM 05/24/2016  7:11 PM Full Code [id number]  Harvie Bridge, DO Inpatient   01/13/2015  1:44 AM 01/14/2015  2:46 PM Full Code [id number]  Juluis Mire, MD ED   12/15/2014  9:32 AM 12/15/2014  2:31 PM Full Code [id number]  Algernon Huxley, MD Inpatient   05/06/2014  8:29 PM 05/10/2014  5:23 PM Full Code [id number]  Elam Dutch, MD Inpatient   05/04/2014 10:24 PM 05/06/2014  8:29 PM Full Code [id number]  Rise Patience, MD Inpatient     Disposition Plan: to be determined  Consultants:  Nephrology  Vascular  surgery  Antibiotics:  Vancomycin  Time spent: 28 minutes. Case discussed with sister on the phone.  Loletha Grayer  [employer]

## 2016-07-28 NOTE — Progress Notes (Signed)
[facility]  ROUNDING NOTE   Subjective:   Permcath removed yesterday. Cath tip culture pending.   Tmax 102.3. Delirium last night. Patient denies recalling any events from last night. Alert and oriented this morning.   Objective:  Vital signs in last 24 hours:  Temp:  [98.3 F (36.8 C)-102.3 F (39.1 C)] 98.8 F (37.1 C) (03/04 0621) Pulse Rate:  [81-105] 92 (03/04 0621) Resp:  [14-20] 18 (03/04 0621) BP: (90-142)/(59-82) 108/82 (03/04 0621) SpO2:  [95 %-100 %] 97 % (03/04 0621)  Weight change:  Filed Weights   07/26/16 1726  Weight: 79.4 kg (175 lb)    Intake/Output: I/O last 3 completed shifts: In: 340 [P.O.:240; IV Piggyback:100] Out: 0    Intake/Output this shift:  No intake/output data recorded.  Physical Exam: General: Laying in bed  Head: Normocephalic, atraumatic. Moist oral mucosal membranes  Eyes: Anicteric, PERRL  Neck: Supple, trachea midline  Lungs:  Clear to auscultation  Heart: Regular rate and rhythm  Abdomen:  Soft, nontender  Extremities: Left BKA, right toe amputations  Neurologic: Nonfocal, moving all four extremities  Skin: No lesions  Access: Right AVF fistula not functioning    Basic Metabolic Panel:  Recent Labs Lab 07/25/16 1306 07/25/16 1359 07/26/16 0645 07/27/16 0139  NA 137 137 135 136  K 5.6* 5.8* 7.0* 5.1  CL 100*  --  103 96*  CO2 23  --  19* 28  GLUCOSE 95 91 94 125*  BUN 70*  --  91* 58*  CREATININE 10.31*  --  12.17* 8.70*  CALCIUM 9.4  --  9.0 8.6*    Liver Function Tests: No results for input(s): AST, ALT, ALKPHOS, BILITOT, PROT, ALBUMIN in the last 168 hours. No results for input(s): LIPASE, AMYLASE in the last 168 hours. No results for input(s): AMMONIA in the last 168 hours.  CBC:  Recent Labs Lab 07/25/16 1306 07/25/16 1359 07/26/16 0645 07/27/16 0139  WBC 12.5*  --  15.0* 16.5*  NEUTROABS 6.0  --   --   --   HGB 12.8* 12.9* 12.3* 11.2*  HCT 38.1* 38.0* 36.6* 33.8*  MCV 93.1  --   91.9 93.0  PLT 119*  --  117* 111*    Cardiac Enzymes:  Recent Labs Lab 07/26/16 0645 07/26/16 0904 07/26/16 1710 07/26/16 2046  TROPONINI 2.06* 2.30* 1.82* 1.74*    BNP: Invalid input(s): POCBNP  CBG:  Recent Labs Lab 07/27/16 0819 07/27/16 1139 07/27/16 1636 07/27/16 2102 07/28/16 0740  GLUCAP 91 125* 87 122* 85    Microbiology: Results for orders placed or performed during the hospital encounter of 07/26/16  Blood Culture (routine x 2)     Status: None (Preliminary result)   Collection Time: 07/26/16  8:08 AM  Result Value Ref Range Status   Specimen Description BLOOD LEFT ANTECUBITAL  Final   Special Requests BOTTLES DRAWN AEROBIC AND ANAEROBIC BCAV  Final   Culture  Setup Time   Final    GRAM POSITIVE COCCI ANAEROBIC BOTTLE ONLY CRITICAL VALUE NOTED.  VALUE IS CONSISTENT WITH PREVIOUSLY REPORTED AND CALLED VALUE.    Culture GRAM POSITIVE COCCI  Final   Report Status PENDING  Incomplete  Blood Culture (routine x 2)     Status: None (Preliminary result)   Collection Time: 07/26/16  9:57 AM  Result Value Ref Range Status   Specimen Description BLOOD LEFT ARM  Final   Special Requests BOTTLES DRAWN AEROBIC AND ANAEROBIC [place]  Final   Culture  Setup  Time   Final    Organism ID to follow GRAM POSITIVE COCCI AEROBIC BOTTLE ONLY CRITICAL RESULT CALLED TO, READ BACK BY AND VERIFIED WITH: [name] COOKSON AT 2325 07/26/16.PMH    Culture GRAM POSITIVE COCCI  Final   Report Status PENDING  Incomplete  Blood Culture ID Panel (Reflexed)     Status: Abnormal   Collection Time: 07/26/16  9:57 AM  Result Value Ref Range Status   Enterococcus species NOT DETECTED NOT DETECTED Final   Listeria monocytogenes NOT DETECTED NOT DETECTED Final   Staphylococcus species DETECTED (A) NOT DETECTED Final    Comment: CRITICAL RESULT CALLED TO, READ BACK BY AND VERIFIED WITH: [name] COOKSON AT 2325 07/26/16.PMH    Staphylococcus aureus DETECTED (A) NOT DETECTED Final    Comment:  Methicillin (oxacillin)-resistant Staphylococcus aureus (MRSA). MRSA is predictably resistant to beta-lactam antibiotics (except ceftaroline). Preferred therapy is vancomycin unless clinically contraindicated. Patient requires contact precautions if  hospitalized. CRITICAL RESULT CALLED TO, READ BACK BY AND VERIFIED WITH: [name] COOKSON AT 2325 07/26/16.PMH    Methicillin resistance DETECTED (A) NOT DETECTED Final    Comment: CRITICAL RESULT CALLED TO, READ BACK BY AND VERIFIED WITH: [name] COOKSON AT 2325 07/26/16.PMH    Streptococcus species NOT DETECTED NOT DETECTED Final   Streptococcus agalactiae NOT DETECTED NOT DETECTED Final   Streptococcus pneumoniae NOT DETECTED NOT DETECTED Final   Streptococcus pyogenes NOT DETECTED NOT DETECTED Final   Acinetobacter baumannii NOT DETECTED NOT DETECTED Final   Enterobacteriaceae species NOT DETECTED NOT DETECTED Final   Enterobacter cloacae complex NOT DETECTED NOT DETECTED Final   Escherichia coli NOT DETECTED NOT DETECTED Final   Klebsiella oxytoca NOT DETECTED NOT DETECTED Final   Klebsiella pneumoniae NOT DETECTED NOT DETECTED Final   Proteus species NOT DETECTED NOT DETECTED Final   Serratia marcescens NOT DETECTED NOT DETECTED Final   Haemophilus influenzae NOT DETECTED NOT DETECTED Final   Neisseria meningitidis NOT DETECTED NOT DETECTED Final   Pseudomonas aeruginosa NOT DETECTED NOT DETECTED Final   Candida albicans NOT DETECTED NOT DETECTED Final   Candida glabrata NOT DETECTED NOT DETECTED Final   Candida krusei NOT DETECTED NOT DETECTED Final   Candida parapsilosis NOT DETECTED NOT DETECTED Final   Candida tropicalis NOT DETECTED NOT DETECTED Final  MRSA PCR Screening     Status: None   Collection Time: 07/27/16  1:10 AM  Result Value Ref Range Status   MRSA by PCR NEGATIVE NEGATIVE Final    Comment:        The GeneXpert MRSA Assay (FDA approved for NASAL specimens only), is one component of a comprehensive MRSA  colonization surveillance program. It is not intended to diagnose MRSA infection nor to guide or monitor treatment for MRSA infections.     Coagulation Studies:  Recent Labs  07/25/16 1306 07/26/16 0805  LABPROT 14.7 14.7  INR 1.14 1.14    Urinalysis: No results for input(s): COLORURINE, LABSPEC, PHURINE, GLUCOSEU, HGBUR, BILIRUBINUR, KETONESUR, PROTEINUR, UROBILINOGEN, NITRITE, LEUKOCYTESUR in the last 72 hours.  Invalid input(s): APPERANCEUR    Imaging: Ct Head Wo Contrast  Result Date: 07/27/2016 CLINICAL DATA:  Confusion, fever. EXAM: CT HEAD WITHOUT CONTRAST TECHNIQUE: Contiguous axial images were obtained from the base of the skull through the vertex without intravenous contrast. COMPARISON:  None. FINDINGS: Brain: There is generalized parenchymal atrophy with commensurate dilatation of the ventricles and sulci. Mild chronic small vessel ischemic change noted within the periventricular white matter regions bilaterally. There is no mass, hemorrhage, edema or other  evidence of acute parenchymal abnormality. No extra-axial hemorrhage. Vascular: There are chronic calcified atherosclerotic changes of the large vessels at the skull base. No unexpected hyperdense vessel. Skull: Normal. Negative for fracture or focal lesion. Sinuses/Orbits: Paranasal sinuses are clear. Periorbital and retro-orbital soft tissues are unremarkable. Other: None. IMPRESSION: 1. No acute findings.  No intracranial mass, hemorrhage or edema. 2. Atrophy and mild chronic small vessel ischemic changes in the periventricular white matter. Electronically Signed   By: Franki Cabot M.D.   On: 07/27/2016 18:21     Medications:    . aspirin  325 mg Oral BH-q7a  . calcium acetate  667 mg Oral TID WC  . Chlorhexidine Gluconate Cloth  6 each Topical Q0600  . fluticasone  1 spray Each Nare Daily  . insulin aspart  0-9 Units Subcutaneous TID WC  . midodrine  5 mg Oral Q M,W,F  . mometasone-formoterol  2 puff  Inhalation BID  . multivitamin  1 tablet Oral QHS  . multivitamin with minerals  1 tablet Oral Daily  . mupirocin ointment  1 application Nasal BID  . pantoprazole  40 mg Oral Daily  . polyethylene glycol  17 g Oral BH-q7a  . sevelamer carbonate  800 mg Oral Q breakfast  . simvastatin  20 mg Oral Daily  . sodium chloride flush  3 mL Intravenous Q12H  . tiotropium  18 mcg Inhalation Daily  . vancomycin  1,000 mg Intravenous Q M,W,F-HD     Assessment/ Plan:  [name] is a 61 y.o. black male [name] is a 61 y.o. black male with ESRD on hemodialysis, hypertension, hyperlipidemia, anemia, COPD, peripheral vascular disease, left BKA, right toe amputations  MWF [facility].   1.  End stage renal disease with hyperkalemia on admission. Emergent hemodialysis on 3/2. No indication for dialysis today. Currently without access.  Complication of dialysis device. Tunnel infection. Catheter removed 3/3, he will need several catheter free days before a new catheter placed. Fever last night with rapid response. - vancomycin for MRSA in blood - Monitor daily for dialysis need.  - Appreciate Vascular and ID input.   2.  Anemia of chronic kidney disease: hemoglobin 11.2 - EPO as outpatient.   3. Hypertension: blood pressure at goal.  - midodrine before dialysis.   4. Secondary Hyperparathyroidism: - calcium acetate with meals.   5. Sepsis: blood cultures positive with MRSA. Catheter tip culture pending.  - Repeat blood cultures today.  Empiric antibiotics. Vancomycin.     LOS: [place], [place] 3/4/20188:23 AM

## 2016-07-29 ENCOUNTER — Encounter
Admission: EM | Disposition: A | Discharge status: Skilled Nursing Facility | Payer: Self-pay | Source: Home / Self Care | Attending: Internal Medicine

## 2016-07-29 DIAGNOSIS — T827XXA Infection and inflammatory reaction due to other cardiac and vascular devices, implants and grafts, initial encounter: Secondary | ICD-10-CM

## 2016-07-29 DIAGNOSIS — N186 End stage renal disease: Secondary | ICD-10-CM

## 2016-07-29 DIAGNOSIS — I10 Essential (primary) hypertension: Secondary | ICD-10-CM

## 2016-07-29 LAB — CULTURE, BLOOD (ROUTINE X 2)

## 2016-07-29 LAB — RENAL FUNCTION PANEL
ALBUMIN: 2.5 g/dL — AB (ref 3.5–5.0)
Anion gap: 17 — ABNORMAL HIGH (ref 5–15)
BUN: 120 mg/dL — AB (ref 6–20)
CHLORIDE: 96 mmol/L — AB (ref 101–111)
CO2: 24 mmol/L (ref 22–32)
Calcium: 8.3 mg/dL — ABNORMAL LOW (ref 8.9–10.3)
Creatinine, Ser: 15.92 mg/dL — ABNORMAL HIGH (ref 0.61–1.24)
GFR calc Af Amer: 3 mL/min — ABNORMAL LOW (ref >60)
GFR calc non Af Amer: 3 mL/min — ABNORMAL LOW (ref >60)
GLUCOSE: 93 mg/dL (ref 65–99)
PHOSPHORUS: 10 mg/dL — AB (ref 2.5–4.6)
POTASSIUM: 6 mmol/L — AB (ref 3.5–5.1)
Sodium: 137 mmol/L (ref 135–145)

## 2016-07-29 LAB — CBC
HEMATOCRIT: 30.5 % — AB (ref 40.0–52.0)
Hemoglobin: 10.2 g/dL — ABNORMAL LOW (ref 13.0–18.0)
MCH: 31 pg (ref 26.0–34.0)
MCHC: 33.4 g/dL (ref 32.0–36.0)
MCV: 92.8 fL (ref 80.0–100.0)
Platelets: 114 10*3/uL — ABNORMAL LOW (ref 150–440)
RBC: 3.28 MIL/uL — ABNORMAL LOW (ref 4.40–5.90)
RDW: 16.3 % — AB (ref 11.5–14.5)
WBC: 6.2 10*3/uL (ref 3.8–10.6)

## 2016-07-29 LAB — GLUCOSE, CAPILLARY
GLUCOSE-CAPILLARY: 82 mg/dL (ref 65–99)
GLUCOSE-CAPILLARY: 128 mg/dL — AB (ref 65–99)
Glucose-Capillary: 125 mg/dL — ABNORMAL HIGH (ref 65–99)

## 2016-07-29 SURGERY — DIALYSIS/PERMA CATHETER INSERTION
Anesthesia: LOCAL

## 2016-07-29 NOTE — Care Management Important Message (Signed)
Important Message  Patient Details  Name: [name] MRN: [id number] Date of Birth: [birth year]   Medicare Important Message Given:  Yes    Jolly Mango, RN 07/29/2016, 12:15 PM

## 2016-07-29 NOTE — Progress Notes (Signed)
Post hd assessment 

## 2016-07-29 NOTE — Progress Notes (Signed)
Pre hd assessment  

## 2016-07-29 NOTE — Progress Notes (Signed)
Report called to Verdis Prime, Rn. All questions answered. Pt currently in dialysis. Nurse tech is packing up his belongings and will place them in room 202.

## 2016-07-29 NOTE — Progress Notes (Signed)
Start of hd 

## 2016-07-29 NOTE — Progress Notes (Signed)
Post hd vitals 

## 2016-07-29 NOTE — Progress Notes (Signed)
Patient ID: [name], male   DOB: 15-Nov-1955, 61 y.o.   MRN: [id number]   [facility] PROGRESS NOTE  [name] JME:[id number] DOB: [birth year] DOA: 07/26/2016 PCP: [facility]  HPI/Subjective: Patient offers no complaints. Last night a rapid response was called because e had altered mental status. The nurse found him to him on oxygen. He also had a fever of 102 at that time. The patient feels well at this time. No complaints of any neck pain today. Able to move his neck around pretty well.  Objective: Vitals:   07/29/16 1245 07/29/16 1300  BP:  110/68  Pulse: 90 89  Resp: 14 15  Temp:      Filed Weights   07/26/16 1726  Weight: 79.4 kg (175 lb)    ROS: Review of Systems  Constitutional: Negative for chills and fever.  Eyes: Negative for blurred vision.  Respiratory: Negative for cough and shortness of breath.   Cardiovascular: Negative for chest pain.  Gastrointestinal: Negative for abdominal pain, constipation, diarrhea, nausea and vomiting.  Genitourinary: Negative for dysuria.  Musculoskeletal: Negative for joint pain and neck pain.  Neurological: Negative for dizziness and headaches.   Exam: Physical Exam  Constitutional: He is oriented to person, place, and time.  HENT:  Nose: No mucosal edema.  Mouth/Throat: No oropharyngeal exudate or posterior oropharyngeal edema.  Eyes: Conjunctivae, EOM and lids are normal. Pupils are equal, round, and reactive to light.  Neck: No JVD present. Carotid bruit is not present. No edema present. No thyroid mass and no thyromegaly present.  Cardiovascular: S1 normal and S2 normal.  Exam reveals no gallop.   Murmur heard.  Systolic murmur is present with a grade of 2/6  Pulses:      Dorsalis pedis pulses are 2+ on the right side, and 2+ on the left side.  Respiratory: No respiratory distress. He has no wheezes. He has no rhonchi. He has no rales.  GI: Soft. Bowel sounds are normal. There is no tenderness.   Musculoskeletal:       Right ankle: He exhibits no swelling.  Lymphadenopathy:    He has no cervical adenopathy.  Neurological: He is alert and oriented to person, place, and time. No cranial nerve deficit.  Skin: Skin is warm. No rash noted. Nails show no clubbing.  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      Data Reviewed: Basic Metabolic Panel:  Recent Labs Lab 07/25/16 1306 07/25/16 1359 07/26/16 0645 07/27/16 0139  NA 137 137 135 136  K 5.6* 5.8* 7.0* 5.1  CL 100*  --  103 96*  CO2 23  --  19* 28  GLUCOSE 95 91 94 125*  BUN 70*  --  91* 58*  CREATININE 10.31*  --  12.17* 8.70*  CALCIUM 9.4  --  9.0 8.6*   CBC:  Recent Labs Lab 07/25/16 1306 07/25/16 1359 07/26/16 0645 07/27/16 0139  WBC 12.5*  --  15.0* 16.5*  NEUTROABS 6.0  --   --   --   HGB 12.8* 12.9* 12.3* 11.2*  HCT 38.1* 38.0* 36.6* 33.8*  MCV 93.1  --  91.9 93.0  PLT 119*  --  117* 111*   Cardiac Enzymes:  Recent Labs Lab 07/26/16 0645 07/26/16 0904 07/26/16 1710 07/26/16 2046  TROPONINI 2.06* 2.30* 1.82* 1.74*   BNP (last 3 results)  Recent Labs  05/21/16 1954  BNP 3,452.0*  CBG:  Recent Labs Lab 07/28/16 1150 07/28/16 1707 07/28/16 2115 07/29/16 0734 07/29/16 1141  GLUCAP 90 103* 98 82 128*    Recent Results (from the past 240 hour(s))  Surgical pcr screen     Status: None   Collection Time: 07/25/16  1:03 PM  Result Value Ref Range Status   MRSA, PCR NEGATIVE NEGATIVE Final   Staphylococcus aureus NEGATIVE NEGATIVE Final    Comment:        The Xpert SA Assay (FDA approved for NASAL specimens in patients over 26 years of age), is one component of a comprehensive surveillance program.  Test performance has been validated by [facility] for patients greater than or equal to 44 year old. It is not intended to diagnose infection nor to guide or monitor treatment.   Blood Culture (routine x 2)     Status:  Abnormal   Collection Time: 07/26/16  8:08 AM  Result Value Ref Range Status   Specimen Description BLOOD LEFT ANTECUBITAL  Final   Special Requests BOTTLES DRAWN AEROBIC AND ANAEROBIC BCAV  Final   Culture  Setup Time   Final    GRAM POSITIVE COCCI ANAEROBIC BOTTLE ONLY CRITICAL VALUE NOTED.  VALUE IS CONSISTENT WITH PREVIOUSLY REPORTED AND CALLED VALUE.    Culture (A)  Final    STAPHYLOCOCCUS AUREUS SUSCEPTIBILITIES PERFORMED ON PREVIOUS CULTURE WITHIN THE LAST 5 DAYS. Performed at [facility], [place] [address] [place], [place] [address]    Report Status 07/29/2016 FINAL  Final  Blood Culture (routine x 2)     Status: Abnormal   Collection Time: 07/26/16  9:57 AM  Result Value Ref Range Status   Specimen Description BLOOD LEFT ARM  Final   Special Requests BOTTLES DRAWN AEROBIC AND ANAEROBIC BCHV  Final   Culture  Setup Time   Final    GRAM POSITIVE COCCI AEROBIC BOTTLE ONLY CRITICAL RESULT CALLED TO, READ BACK BY AND VERIFIED WITH: [name] COOKSON AT 2325 07/26/16.PMH Performed at [facility], [place] [address], [place],  [address]    Culture METHICILLIN RESISTANT STAPHYLOCOCCUS AUREUS (A)  Final   Report Status 07/29/2016 FINAL  Final   Organism ID, Bacteria METHICILLIN RESISTANT STAPHYLOCOCCUS AUREUS  Final      Susceptibility   Methicillin resistant staphylococcus aureus - MIC*    CIPROFLOXACIN >=8 RESISTANT Resistant     ERYTHROMYCIN >=8 RESISTANT Resistant     GENTAMICIN <=0.5 SENSITIVE Sensitive     OXACILLIN >=4 RESISTANT Resistant     TETRACYCLINE <=1 SENSITIVE Sensitive     VANCOMYCIN 1 SENSITIVE Sensitive     TRIMETH/SULFA <=10 SENSITIVE Sensitive     CLINDAMYCIN RESISTANT Resistant     RIFAMPIN <=0.5 SENSITIVE Sensitive     Inducible Clindamycin POSITIVE Resistant     * METHICILLIN RESISTANT STAPHYLOCOCCUS AUREUS  Blood Culture ID Panel (Reflexed)     Status: Abnormal   Collection Time: 07/26/16  9:57 AM  Result Value Ref Range Status    Enterococcus species NOT DETECTED NOT DETECTED Final   Listeria monocytogenes NOT DETECTED NOT DETECTED Final   Staphylococcus species DETECTED (A) NOT DETECTED Final    Comment: CRITICAL RESULT CALLED TO, READ BACK BY AND VERIFIED WITH: [name] COOKSON AT 2325 07/26/16.PMH    Staphylococcus aureus DETECTED (A) NOT DETECTED Final    Comment: Methicillin (oxacillin)-resistant Staphylococcus aureus (MRSA). MRSA is predictably resistant to beta-lactam antibiotics (except ceftaroline). Preferred therapy is vancomycin unless clinically contraindicated. Patient requires contact precautions if  hospitalized.  CRITICAL RESULT CALLED TO, READ BACK BY AND VERIFIED WITH: [name] COOKSON AT 2325 07/26/16.PMH    Methicillin resistance DETECTED (A) NOT DETECTED Final    Comment: CRITICAL RESULT CALLED TO, READ BACK BY AND VERIFIED WITH: [name] COOKSON AT 2325 07/26/16.PMH    Streptococcus species NOT DETECTED NOT DETECTED Final   Streptococcus agalactiae NOT DETECTED NOT DETECTED Final   Streptococcus pneumoniae NOT DETECTED NOT DETECTED Final   Streptococcus pyogenes NOT DETECTED NOT DETECTED Final   Acinetobacter baumannii NOT DETECTED NOT DETECTED Final   Enterobacteriaceae species NOT DETECTED NOT DETECTED Final   Enterobacter cloacae complex NOT DETECTED NOT DETECTED Final   Escherichia coli NOT DETECTED NOT DETECTED Final   Klebsiella oxytoca NOT DETECTED NOT DETECTED Final   Klebsiella pneumoniae NOT DETECTED NOT DETECTED Final   Proteus species NOT DETECTED NOT DETECTED Final   Serratia marcescens NOT DETECTED NOT DETECTED Final   Haemophilus influenzae NOT DETECTED NOT DETECTED Final   Neisseria meningitidis NOT DETECTED NOT DETECTED Final   Pseudomonas aeruginosa NOT DETECTED NOT DETECTED Final   Candida albicans NOT DETECTED NOT DETECTED Final   Candida glabrata NOT DETECTED NOT DETECTED Final   Candida krusei NOT DETECTED NOT DETECTED Final   Candida parapsilosis NOT DETECTED NOT DETECTED Final    Candida tropicalis NOT DETECTED NOT DETECTED Final  MRSA PCR Screening     Status: None   Collection Time: 07/27/16  1:10 AM  Result Value Ref Range Status   MRSA by PCR NEGATIVE NEGATIVE Final    Comment:        The GeneXpert MRSA Assay (FDA approved for NASAL specimens only), is one component of a comprehensive MRSA colonization surveillance program. It is not intended to diagnose MRSA infection nor to guide or monitor treatment for MRSA infections.   Cath Tip Culture     Status: Abnormal (Preliminary result)   Collection Time: 07/27/16 12:07 PM  Result Value Ref Range Status   Specimen Description CATH TIP  Final   Special Requests NONE  Final   Culture (A)  Final    >=100,000 COLONIES/mL METHICILLIN RESISTANT STAPHYLOCOCCUS AUREUS   Report Status PENDING  Incomplete   Organism ID, Bacteria METHICILLIN RESISTANT STAPHYLOCOCCUS AUREUS (A)  Final      Susceptibility   Methicillin resistant staphylococcus aureus - MIC*    CIPROFLOXACIN >=8 RESISTANT Resistant     ERYTHROMYCIN >=8 RESISTANT Resistant     GENTAMICIN <=0.5 SENSITIVE Sensitive     OXACILLIN >=4 RESISTANT Resistant     TETRACYCLINE <=1 SENSITIVE Sensitive     VANCOMYCIN 1 SENSITIVE Sensitive     TRIMETH/SULFA <=10 SENSITIVE Sensitive     CLINDAMYCIN RESISTANT Resistant     RIFAMPIN <=0.5 SENSITIVE Sensitive     Inducible Clindamycin POSITIVE Resistant     * >=100,000 COLONIES/mL METHICILLIN RESISTANT STAPHYLOCOCCUS AUREUS  CULTURE, BLOOD (ROUTINE X 2) w Reflex to ID Panel     Status: None (Preliminary result)   Collection Time: 07/28/16  1:59 PM  Result Value Ref Range Status   Specimen Description BLOOD  LEFT HAND  Final   Special Requests BOTTLES DRAWN AEROBIC AND ANAEROBIC  BCAV  Final   Culture NO GROWTH < 24 HOURS  Final   Report Status PENDING  Incomplete  CULTURE, BLOOD (ROUTINE X 2) w Reflex to ID Panel     Status: None (Preliminary result)   Collection Time: 07/28/16  2:10 PM  Result Value Ref Range  Status   Specimen Description BLOOD  LEFT WRIST  Final   Special Requests BOTTLES DRAWN AEROBIC AND ANAEROBIC  BCAV  Final   Culture NO GROWTH < 24 HOURS  Final   Report Status PENDING  Incomplete     Studies: Ct Head Wo Contrast  Result Date: 07/27/2016 CLINICAL DATA:  Confusion, fever. EXAM: CT HEAD WITHOUT CONTRAST TECHNIQUE: Contiguous axial images were obtained from the base of the skull through the vertex without intravenous contrast. COMPARISON:  None. FINDINGS: Brain: There is generalized parenchymal atrophy with commensurate dilatation of the ventricles and sulci. Mild chronic small vessel ischemic change noted within the periventricular white matter regions bilaterally. There is no mass, hemorrhage, edema or other evidence of acute parenchymal abnormality. No extra-axial hemorrhage. Vascular: There are chronic calcified atherosclerotic changes of the large vessels at the skull base. No unexpected hyperdense vessel. Skull: Normal. Negative for fracture or focal lesion. Sinuses/Orbits: Paranasal sinuses are clear. Periorbital and retro-orbital soft tissues are unremarkable. Other: None. IMPRESSION: 1. No acute findings.  No intracranial mass, hemorrhage or edema. 2. Atrophy and mild chronic small vessel ischemic changes in the periventricular white matter. Electronically Signed   By: Franki Cabot M.D.   On: 07/27/2016 18:21    Scheduled Meds: . aspirin  325 mg Oral BH-q7a  . calcium acetate  667 mg Oral TID WC  . Chlorhexidine Gluconate Cloth  6 each Topical Q0600  . fluticasone  1 spray Each Nare Daily  . heparin subcutaneous  5,000 Units Subcutaneous Q8H  . insulin aspart  0-9 Units Subcutaneous TID WC  . midodrine  5 mg Oral Q M,W,F  . mometasone-formoterol  2 puff Inhalation BID  . multivitamin  1 tablet Oral QHS  . multivitamin with minerals  1 tablet Oral Daily  . mupirocin ointment  1 application Nasal BID  . pantoprazole  40 mg Oral Daily  . polyethylene glycol  17 g Oral  BH-q7a  . sevelamer carbonate  800 mg Oral Q breakfast  . simvastatin  20 mg Oral Daily  . sodium chloride flush  3 mL Intravenous Q12H  . tiotropium  18 mcg Inhalation Daily    Assessment/Plan:  1. MRSA sepsis. Source likely dialysis catheter. Dialysis catheter removed Saturday by vascular surgery.  Continue vancomycin dosed by pharmacy.  Repeat blood cultures drawn yesterday afternoon are negative so far. Consult infectious disease on length of therapy (2 versus 4 weeks) and whether or not TEE needs to be done.  Temporary catheter placed today for dialysis 2. Acute encephalopathy. Resolved 3. Acute respiratory failure with hypoxia. Continue oxygen supplementation. Down to 1 L of oxygen 4. Severe hyperkalemia improved with dialysis.  5. Hyperlipidemia unspecified on simvastatin 6. GERD on Protonix 7. Relative hypotension continue to hold antihypertensive medications 8. End-stage renal disease.  Temporary catheter placed today for dialysis.  9. Cardiomyopathy seen on echocardiogram. No change from previous  Code Status:     Code Status Orders        Start     Ordered   07/26/16 1708  Do not attempt resuscitation (DNR)  Continuous    Question Answer Comment  In the event of cardiac or respiratory ARREST Do not call a "code blue"   In the event of cardiac or respiratory ARREST Do not perform Intubation, CPR, defibrillation or ACLS   In the event of cardiac or respiratory ARREST Use medication by any route, position, wound care, and other measures to relive pain and suffering. May use oxygen, suction and manual treatment of airway obstruction as needed for comfort.  Comments confirmed with pt's sister in room at admission.      07/26/16 1707    Code Status History    Date Active Date Inactive Code Status Order ID Comments User Context   05/22/2016 12:34 AM 05/24/2016  7:11 PM Full Code [id number]  Harvie Bridge, DO Inpatient   01/13/2015  1:44 AM 01/14/2015  2:46 PM Full Code  [id number]  Juluis Mire, MD ED   12/15/2014  9:32 AM 12/15/2014  2:31 PM Full Code [id number]  Algernon Huxley, MD Inpatient   05/06/2014  8:29 PM 05/10/2014  5:23 PM Full Code [id number]  Elam Dutch, MD Inpatient   05/04/2014 10:24 PM 05/06/2014  8:29 PM Full Code [id number]  Rise Patience, MD Inpatient     Disposition Plan: Back to facility once blood cultures remain negative and per minute catheter placed back again and plan set on length of therapy.  Consultants:  Nephrology  Vascular surgery  Infectious disease  Antibiotics:  Vancomycin  Time spent: 25 minutes. Case discussed with Friend at the bedside and permission by the patient to do so.  Loletha Grayer  [employer]

## 2016-07-29 NOTE — Consult Note (Signed)
[name] Consult Note  MRN : [id number]  TAHJI [place] is a 61 y.o. ([birth year]) male who presents with chief complaint of  Chief Complaint  Patient presents with  . Chest Pain  . [name] Access Problem  . Neck Pain  .  History of Present Illness: I am asked by Dr. Candiss Norse to see the patient today for placement of a dialysis catheter. He was admitted over the weekend with sepsis and found to have a PermCath infection. This was removed by the covering physician 2 days ago. He is now due for his dialysis. He remains lethargic. His fever curve is down. His mental status is improved from admission but not up to his baseline. He is not having any pain, shortness of breath, or other symptoms currently. He has been scheduled to have a left arm AV fistula placed and this is been postponed on multiple occasions due to either hyperkalemia, bronchitis, or other issues. He had a right arm AV fistula that surgical revision but went on to fail.  Current Facility-Administered Medications  Medication Dose Route Frequency Provider Last Rate Last Dose  . acetaminophen (TYLENOL) suppository 650 mg  650 mg Rectal Q6H PRN Vaughan Basta, MD   650 mg at 07/27/16 1710  . aspirin tablet 325 mg  325 mg Oral Martin Majestic, MD   325 mg at 07/29/16 0905  . calcium acetate (PHOSLO) capsule 667 mg  667 mg Oral TID WC Vaughan Basta, MD   667 mg at 07/29/16 1156  . Chlorhexidine Gluconate Cloth 2 % PADS 6 each  6 each Topical Q0600 Loletha Grayer, MD   6 each at 07/29/16 [phone]  . docusate sodium (COLACE) capsule 100 mg  100 mg Oral BID PRN Vaughan Basta, MD      . fluticasone (FLONASE) 50 MCG/ACT nasal spray 1 spray  1 spray Each Nare Daily Vaughan Basta, MD   1 spray at 07/29/16 0905  . heparin injection 5,000 Units  5,000 Units Subcutaneous Q8H Loletha Grayer, MD   5,000 Units at 07/29/16 0526  . insulin aspart (novoLOG) injection 0-9  Units  0-9 Units Subcutaneous TID WC Vaughan Basta, MD   1 Units at 07/29/16 1156  . ipratropium-albuterol (DUONEB) 0.5-2.5 (3) MG/3ML nebulizer solution 3 mL  3 mL Nebulization Q4H PRN Vaughan Basta, MD      . midodrine (PROAMATINE) tablet 5 mg  5 mg Oral Q M,W,F Vaughan Basta, MD   5 mg at 07/29/16 0905  . mometasone-formoterol (DULERA) 200-5 MCG/ACT inhaler 2 puff  2 puff Inhalation BID Vaughan Basta, MD   2 puff at 07/29/16 2297  . multivitamin (RENA-VIT) tablet 1 tablet  1 tablet Oral QHS Vaughan Basta, MD   1 tablet at 07/28/16 2140  . multivitamin with minerals tablet 1 tablet  1 tablet Oral Daily Vaughan Basta, MD   1 tablet at 07/29/16 0905  . mupirocin ointment (BACTROBAN) 2 % 1 application  1 application Nasal BID Loletha Grayer, MD   1 application at 98/92/11 [phone]  . oxyCODONE (Oxy IR/ROXICODONE) immediate release tablet 5 mg  5 mg Oral Q8H PRN Vaughan Basta, MD   5 mg at 07/29/16 0923  . pantoprazole (PROTONIX) EC tablet 40 mg  40 mg Oral Daily Vaughan Basta, MD   40 mg at 07/29/16 0905  . polyethylene glycol (MIRALAX / GLYCOLAX) packet 17 g  17 g Oral BH-q7a Vaughan Basta, MD      . sevelamer carbonate (RENVELA) tablet 800 mg  800 mg Oral Q breakfast Vaughan Basta, MD   800 mg at 07/29/16 0904  . simvastatin (ZOCOR) tablet 20 mg  20 mg Oral Daily Vaughan Basta, MD   20 mg at 07/29/16 0904  . sodium chloride flush (NS) 0.9 % injection 3 mL  3 mL Intravenous Q12H Vaughan Basta, MD   3 mL at 07/29/16 0906  . tiotropium (SPIRIVA) inhalation capsule 18 mcg  18 mcg Inhalation Daily Vaughan Basta, MD   18 mcg at 07/29/16 0918  . vancomycin (VANCOCIN) IVPB 750 mg/150 ml premix  750 mg Intravenous Q dialysis Loletha Grayer, MD        Past Medical History:  Diagnosis Date  . Anemia   . Asthma   . CHF (congestive heart failure) ([place])   . Chronic systolic heart failure ([place])   .  Complication of anesthesia    hypotension  . COPD (chronic obstructive pulmonary disease) ([place])   . Coronary artery disease   . Dialysis patient ([place])    Mon, Wed, Fri  . End stage renal disease ([place])   . GERD (gastroesophageal reflux disease)   . Headache   . History of kidney stones   . HLD (hyperlipidemia)   . HTN (hypertension)   . Hyperparathyroidism   . Myocardial infarction   . Peripheral [name] disease ([place])   . Shortness of breath dyspnea   . Sleep apnea   . Tobacco dependence     Past Surgical History:  Procedure Laterality Date  . AMPUTATION Left 05/06/2014   Procedure: AMPUTATION BELOW KNEE;  Surgeon: Elam Dutch, MD;  Location: [facility];  Service: [name];  Laterality: Left;  . AMPUTATION Right 01/12/2015   Procedure: Foot transmetatarsal amputation;  Surgeon: Algernon Huxley, MD;  Location: [facility];  Service: [name];  Laterality: Right;  . APPLICATION OF WOUND VAC Right 03/01/2015   Procedure: Application of Bio-connekt graft and wound vac application to right foot ;  Surgeon: Algernon Huxley, MD;  Location: [facility];  Service: [name];  Laterality: Right;  . AV FISTULA PLACEMENT Left   . CARDIAC CATHETERIZATION     stent placement   . CORONARY ANGIOPLASTY    . LIGATION OF ARTERIOVENOUS  FISTULA Right 01/31/2016   Procedure: LIGATION OF ARTERIOVENOUS  FISTULA;  Surgeon: Algernon Huxley, MD;  Location: [facility];  Service: [name];  Laterality: Right;  . PERIPHERAL [name] CATHETERIZATION Right 12/15/2014   Procedure: Lower Extremity Angiography;  Surgeon: Algernon Huxley, MD;  Location: [facility];  Service: Cardiovascular;  Laterality: Right;  . PERIPHERAL [name] CATHETERIZATION  12/15/2014   Procedure: Lower Extremity Intervention;  Surgeon: Algernon Huxley, MD;  Location: [facility];  Service: Cardiovascular;;  . PERIPHERAL [name] CATHETERIZATION Right 08/14/2015   Procedure: A/V Shuntogram/Fistulagram;  Surgeon: Algernon Huxley, MD;  Location: [facility];  Service: Cardiovascular;  Laterality: Right;  . PERIPHERAL [name] CATHETERIZATION N/A 08/14/2015   Procedure: A/V Shunt Intervention;  Surgeon: Algernon Huxley, MD;  Location: [facility];  Service: Cardiovascular;  Laterality: N/A;  . PERIPHERAL [name] CATHETERIZATION N/A 01/11/2016   Procedure: Dialysis/Perma Catheter Insertion;  Surgeon: Algernon Huxley, MD;  Location: [facility];  Service: Cardiovascular;  Laterality: N/A;  . REVISON OF ARTERIOVENOUS FISTULA Right 02/17/2016   Procedure: removal of AV fistula;  Surgeon: Serafina Mitchell, MD;  Location: [facility];  Service: [name];  Laterality: Right;  . REVISON OF ARTERIOVENOUS FISTULA Right 01/31/2016   Procedure:  REVISON OF ARTERIOVENOUS FISTULA ( BRACHIOCEPHALIC ) W/ ARTEGRAFT;  Surgeon: Algernon Huxley, MD;  Location: [facility];  Service: [name];  Laterality: Right;  . TRANSMETATARSAL AMPUTATION Right 05/04/2015   Procedure: TRANSMETATARSAL AMPUTATION REVISION, great toe amputation;  Surgeon: Algernon Huxley, MD;  Location: [facility];  Service: [name];  Laterality: Right;    Social History Social History  Substance Use Topics  . Smoking status: Light Tobacco Smoker    Packs/day: 0.25    Types: Cigarettes  . Smokeless tobacco: Never Used     Comment: 5  . Alcohol use No  No IV drug use Lives in a skilled nursing facility  Family History Family History  Problem Relation Age of Onset  . Heart failure Other   . Hypertension Other   . Leukemia Other   . Diabetes Other   No bleeding disorders, clotting disorders, autoimmune diseases, or aneurysms  Allergies  Allergen Reactions  . Dust Mite Extract      REVIEW OF SYSTEMS (Negative unless checked)  Constitutional: [] Weight loss  [x] Fever  [x] Chills Cardiac: [] Chest pain   [] Chest pressure   [] Palpitations   [] Shortness of breath when laying flat   [] Shortness of breath at rest   [] Shortness of breath with exertion. [name]:  [] Pain in legs with walking    [] Pain in legs at rest   [] Pain in legs when laying flat   [] Claudication   [] Pain in feet when walking  [] Pain in feet at rest  [] Pain in feet when laying flat   [] History of DVT   [] Phlebitis   [] Swelling in legs   [] Varicose veins   [x] Non-healing ulcers Pulmonary:   [] Uses home oxygen   [] Productive cough   [] Hemoptysis   [] Wheeze  [x] COPD   [] Asthma Neurologic:  [] Dizziness  [] Blackouts   [] Seizures   [] History of stroke   [] History of TIA  [] Aphasia   [] Temporary blindness   [] Dysphagia   [] Weakness or numbness in arms   [] Weakness or numbness in legs Musculoskeletal:  [] Arthritis   [] Joint swelling   [] Joint pain   [] Low back pain Hematologic:  [] Easy bruising  [] Easy bleeding   [] Hypercoagulable state   [] Anemic  [] Hepatitis Gastrointestinal:  [] Blood in stool   [] Vomiting blood  [] Gastroesophageal reflux/heartburn   [] Difficulty swallowing. Genitourinary:  [x] Chronic kidney disease   [] Difficult urination  [] Frequent urination  [] Burning with urination   [] Blood in urine Skin:  [] Rashes   [] Ulcers   [] Wounds Psychological:  [] History of anxiety   []  History of major depression.  Physical Examination  Vitals:   07/28/16 1928 07/29/16 0453 07/29/16 0901 07/29/16 1230  BP: 116/77 98/68 116/78 108/72  Pulse: 88 86 91 91  Resp: 16 18 18 14   Temp: 98.6 F (37 C) 98 F (36.7 C) 98.1 F (36.7 C)   TempSrc: Oral  Oral   SpO2: 100% 100% 100% 96%  Weight:      Height:       Body mass index is 20.22 kg/m. Gen:  WD/WN, NAD Head: [place]/AT, No temporalis wasting. Prominent temp pulse not noted. Ear/Nose/Throat: Hearing grossly intact, nares w/o erythema or drainage, oropharynx w/o Erythema/Exudate Eyes: Sclera non-icteric, conjunctiva clear Neck: Trachea midline.  No JVD.  Pulmonary:  Good air movement, respirations not labored, equal bilaterally.  Cardiac: RRR, normal S1, S2. [name]: PermCath removal site from right subclavicular location is clean with dry dressing and no purulent  drainage or erythema. Right arm AV access without thrill. Vessel Right Left  Radial Palpable  Palpable                                   Gastrointestinal: soft, non-tender/non-distended. No guarding/reflex.  Musculoskeletal: M/S 5/5 throughout.  Left leg amputation. Right transmetatarsal amputation. Neurologic: Sensation grossly intact in extremities.  Symmetrical.  Speech is fluent. Motor exam as listed above. Psychiatric: Somnolent but awakens to questioning and seems to be appropriate. Normal affect and mood. Dermatologic: No rashes or ulcers noted.  No cellulitis or open wounds. Lymph : No Cervical, Axillary, or Inguinal lymphadenopathy.      CBC Lab Results  Component Value Date   WBC 16.5 (H) 07/27/2016   HGB 11.2 (L) 07/27/2016   HCT 33.8 (L) 07/27/2016   MCV 93.0 07/27/2016   PLT 111 (L) 07/27/2016    BMET    Component Value Date/Time   NA 136 07/27/2016 0139   NA 135 (L) 04/12/2014 1341   K 5.1 07/27/2016 0139   K 5.2 01/12/2015 1210   K 3.6 04/12/2014 1341   CL 96 (L) 07/27/2016 0139   CL 97 (L) 04/12/2014 1341   CO2 28 07/27/2016 0139   CO2 28 04/12/2014 1341   GLUCOSE 125 (H) 07/27/2016 0139   GLUCOSE 85 04/12/2014 1341   BUN 58 (H) 07/27/2016 0139   BUN 40 (H) 04/12/2014 1341   CREATININE 8.70 (H) 07/27/2016 0139   CREATININE 9.58 (H) 04/12/2014 1341   CALCIUM 8.6 (L) 07/27/2016 0139   CALCIUM 9.5 04/12/2014 1341   GFRNONAA 6 (L) 07/27/2016 0139   GFRNONAA 6 (L) 03/15/2014 2323   GFRNONAA 6 (L) 02/10/2014 1119   GFRAA 7 (L) 07/27/2016 0139   GFRAA 7 (L) 03/15/2014 2323   GFRAA 7 (L) 02/10/2014 1119   Estimated Creatinine Clearance: 9.9 mL/min (by C-G formula based on SCr of 8.7 mg/dL (H)).  COAG Lab Results  Component Value Date   INR 1.14 07/26/2016   INR 1.14 07/25/2016   INR 1.16 04/04/2016    Radiology X-ray Chest Pa Or Ap  Result Date: 07/25/2016 CLINICAL DATA:  Preop [name] procedure EXAM: CHEST 1 VIEW COMPARISON:  05/21/2016  FINDINGS: Trace right pleural effusion. Bilateral mild interstitial thickening. No pneumothorax. No focal consolidation. Stable cardiomegaly. Dual-lumen right-sided central venous catheter in unchanged position. No acute osseous abnormality. IMPRESSION: Cardiomegaly with mild pulmonary [name] congestion. Electronically Signed   By: Kathreen Devoid   On: 07/25/2016 13:36   Ct Head Wo Contrast  Result Date: 07/27/2016 CLINICAL DATA:  Confusion, fever. EXAM: CT HEAD WITHOUT CONTRAST TECHNIQUE: Contiguous axial images were obtained from the base of the skull through the vertex without intravenous contrast. COMPARISON:  None. FINDINGS: Brain: There is generalized parenchymal atrophy with commensurate dilatation of the ventricles and sulci. Mild chronic small vessel ischemic change noted within the periventricular white matter regions bilaterally. There is no mass, hemorrhage, edema or other evidence of acute parenchymal abnormality. No extra-axial hemorrhage. [name]: There are chronic calcified atherosclerotic changes of the large vessels at the skull base. No unexpected hyperdense vessel. Skull: Normal. Negative for fracture or focal lesion. Sinuses/Orbits: Paranasal sinuses are clear. Periorbital and retro-orbital soft tissues are unremarkable. Other: None. IMPRESSION: 1. No acute findings.  No intracranial mass, hemorrhage or edema. 2. Atrophy and mild chronic small vessel ischemic changes in the periventricular white matter. Electronically Signed   By: Franki Cabot M.D.   On: 07/27/2016 18:21   Dg Chest Port 1 View  Result Date:  07/26/2016 CLINICAL DATA:  Chest pain.  Dialysis patient. EXAM: PORTABLE CHEST 1 VIEW COMPARISON:  07/25/2016 FINDINGS: Central venous catheter tip remains in the lower SVC unchanged. Small right pleural effusion unchanged. Mild bibasilar atelectasis unchanged. Decreased lung volume. Negative for edema. IMPRESSION: Small right pleural effusion and mild right lower lobe atelectasis  unchanged. Hypoventilation with decreased lung volume. Electronically Signed   By: Franchot Gallo M.D.   On: 07/26/2016 07:17      Assessment/Plan 1. ESRD. Had to have his PermCath removed for infection over the weekend. Needs a temporary dialysis catheter until his infection clears and this will be placed today. Risks and benefits are discussed. Likely will need a new PermCath later in the week. If possible, we can also consider placing his new left arm AV fistula while he is here if he is felt to be infection free. 2. Sepsis from PermCath infection. PermCath has been removed. Fever curve is down. Temporary dialysis catheter today. 3. Hypertension. Stable on outpatient medications and blood pressure control important in reducing the progression of atherosclerotic disease. On appropriate oral medications. 4. Peripheral arterial disease. Status post intervention and transmetatarsal amputation on the right and major amputation on the left. Stable. No current symptoms.   Leotis Pain, MD  07/29/2016 1:02 PM    This note was created with Dragon medical transcription system.  Any error is purely unintentional

## 2016-07-29 NOTE — Progress Notes (Signed)
  End of hd 

## 2016-07-29 NOTE — Progress Notes (Signed)
[facility]  ROUNDING NOTE   Subjective:   Patient is doing fair overall.  Appetite is low but able to eat some without nausea or vomiting No shortness of breath   Objective:  Vital signs in last 24 hours:  Temp:  [98 F (36.7 C)-98.6 F (37 C)] 98.4 F (36.9 C) (03/05 1605) Pulse Rate:  [79-91] 82 (03/05 1845) Resp:  [11-18] 13 (03/05 1845) BP: (89-116)/(62-82) 91/75 (03/05 1845) SpO2:  [96 %-100 %] 100 % (03/05 1845) Weight:  [82.9 kg (182 lb 12.2 oz)] 82.9 kg (182 lb 12.2 oz) (03/05 1605)  Weight change:  Filed Weights   07/26/16 1726 07/29/16 1605  Weight: 79.4 kg (175 lb) 82.9 kg (182 lb 12.2 oz)    Intake/Output: I/O last 3 completed shifts: In: 410 [P.O.:360; IV Piggyback:50] Out: -    Intake/Output this shift:  No intake/output data recorded.  Physical Exam: General: Laying in bed  Head: Normocephalic, atraumatic. Moist oral mucosal membranes  Eyes: Anicteric,   Neck: Supple, trachea midline  Lungs:  Clear to auscultation  Heart: Regular rate and rhythm  Abdomen:  Soft, nontender  Extremities: Left BKA, right toe amputations  Neurologic: Nonfocal, moving all four extremities  Skin: No lesions  Access: Right AVF fistula not functioning    Basic Metabolic Panel:  Recent Labs Lab 07/25/16 1306 07/25/16 1359 07/26/16 0645 07/27/16 0139 07/29/16 1649  NA 137 137 135 136 137  K 5.6* 5.8* 7.0* 5.1 6.0*  CL 100*  --  103 96* 96*  CO2 23  --  19* 28 24  GLUCOSE 95 91 94 125* 93  BUN 70*  --  91* 58* 120*  CREATININE 10.31*  --  12.17* 8.70* 15.92*  CALCIUM 9.4  --  9.0 8.6* 8.3*  PHOS  --   --   --   --  10.0*    Liver Function Tests:  Recent Labs Lab 07/29/16 1649  ALBUMIN 2.5*   No results for input(s): LIPASE, AMYLASE in the last 168 hours. No results for input(s): AMMONIA in the last 168 hours.  CBC:  Recent Labs Lab 07/25/16 1306 07/25/16 1359 07/26/16 0645 07/27/16 0139 07/29/16 1649  WBC 12.5*  --  15.0* 16.5*  6.2  NEUTROABS 6.0  --   --   --   --   HGB 12.8* 12.9* 12.3* 11.2* 10.2*  HCT 38.1* 38.0* 36.6* 33.8* 30.5*  MCV 93.1  --  91.9 93.0 92.8  PLT 119*  --  117* 111* 114*    Cardiac Enzymes:  Recent Labs Lab 07/26/16 0645 07/26/16 0904 07/26/16 1710 07/26/16 2046  TROPONINI 2.06* 2.30* 1.82* 1.74*    BNP: Invalid input(s): POCBNP  CBG:  Recent Labs Lab 07/28/16 1150 07/28/16 1707 07/28/16 2115 07/29/16 0734 07/29/16 1141  GLUCAP 90 103* 98 53 128*    Microbiology: Results for orders placed or performed during the hospital encounter of 07/26/16  Blood Culture (routine x 2)     Status: Abnormal   Collection Time: 07/26/16  8:08 AM  Result Value Ref Range Status   Specimen Description BLOOD LEFT ANTECUBITAL  Final   Special Requests BOTTLES DRAWN AEROBIC AND ANAEROBIC BCAV  Final   Culture  Setup Time   Final    GRAM POSITIVE COCCI ANAEROBIC BOTTLE ONLY CRITICAL VALUE NOTED.  VALUE IS CONSISTENT WITH PREVIOUSLY REPORTED AND CALLED VALUE.    Culture (A)  Final    STAPHYLOCOCCUS AUREUS SUSCEPTIBILITIES PERFORMED ON PREVIOUS CULTURE WITHIN THE LAST 5 DAYS.  Performed at [facility], [place] [address] [place], [place] [address]    Report Status 07/29/2016 FINAL  Final  Blood Culture (routine x 2)     Status: Abnormal   Collection Time: 07/26/16  9:57 AM  Result Value Ref Range Status   Specimen Description BLOOD LEFT ARM  Final   Special Requests BOTTLES DRAWN AEROBIC AND ANAEROBIC BCHV  Final   Culture  Setup Time   Final    GRAM POSITIVE COCCI AEROBIC BOTTLE ONLY CRITICAL RESULT CALLED TO, READ BACK BY AND VERIFIED WITH: [name] COOKSON AT 2325 07/26/16.PMH Performed at [facility], [place] [address] [place], [place] [address]    Culture METHICILLIN RESISTANT STAPHYLOCOCCUS AUREUS (A)  Final   Report Status 07/29/2016 FINAL  Final   Organism ID, Bacteria METHICILLIN RESISTANT STAPHYLOCOCCUS AUREUS  Final      Susceptibility   Methicillin resistant  staphylococcus aureus - MIC*    CIPROFLOXACIN >=8 RESISTANT Resistant     ERYTHROMYCIN >=8 RESISTANT Resistant     GENTAMICIN <=0.5 SENSITIVE Sensitive     OXACILLIN >=4 RESISTANT Resistant     TETRACYCLINE <=1 SENSITIVE Sensitive     VANCOMYCIN 1 SENSITIVE Sensitive     TRIMETH/SULFA <=10 SENSITIVE Sensitive     CLINDAMYCIN RESISTANT Resistant     RIFAMPIN <=0.5 SENSITIVE Sensitive     Inducible Clindamycin POSITIVE Resistant     * METHICILLIN RESISTANT STAPHYLOCOCCUS AUREUS  Blood Culture ID Panel (Reflexed)     Status: Abnormal   Collection Time: 07/26/16  9:57 AM  Result Value Ref Range Status   Enterococcus species NOT DETECTED NOT DETECTED Final   Listeria monocytogenes NOT DETECTED NOT DETECTED Final   Staphylococcus species DETECTED (A) NOT DETECTED Final    Comment: CRITICAL RESULT CALLED TO, READ BACK BY AND VERIFIED WITH: [name] COOKSON AT 2325 07/26/16.PMH    Staphylococcus aureus DETECTED (A) NOT DETECTED Final    Comment: Methicillin (oxacillin)-resistant Staphylococcus aureus (MRSA). MRSA is predictably resistant to beta-lactam antibiotics (except ceftaroline). Preferred therapy is vancomycin unless clinically contraindicated. Patient requires contact precautions if  hospitalized. CRITICAL RESULT CALLED TO, READ BACK BY AND VERIFIED WITH: [name] COOKSON AT 2325 07/26/16.PMH    Methicillin resistance DETECTED (A) NOT DETECTED Final    Comment: CRITICAL RESULT CALLED TO, READ BACK BY AND VERIFIED WITH: [name] COOKSON AT 2325 07/26/16.PMH    Streptococcus species NOT DETECTED NOT DETECTED Final   Streptococcus agalactiae NOT DETECTED NOT DETECTED Final   Streptococcus pneumoniae NOT DETECTED NOT DETECTED Final   Streptococcus pyogenes NOT DETECTED NOT DETECTED Final   Acinetobacter baumannii NOT DETECTED NOT DETECTED Final   Enterobacteriaceae species NOT DETECTED NOT DETECTED Final   Enterobacter cloacae complex NOT DETECTED NOT DETECTED Final   Escherichia coli NOT DETECTED  NOT DETECTED Final   Klebsiella oxytoca NOT DETECTED NOT DETECTED Final   Klebsiella pneumoniae NOT DETECTED NOT DETECTED Final   Proteus species NOT DETECTED NOT DETECTED Final   Serratia marcescens NOT DETECTED NOT DETECTED Final   Haemophilus influenzae NOT DETECTED NOT DETECTED Final   Neisseria meningitidis NOT DETECTED NOT DETECTED Final   Pseudomonas aeruginosa NOT DETECTED NOT DETECTED Final   Candida albicans NOT DETECTED NOT DETECTED Final   Candida glabrata NOT DETECTED NOT DETECTED Final   Candida krusei NOT DETECTED NOT DETECTED Final   Candida parapsilosis NOT DETECTED NOT DETECTED Final   Candida tropicalis NOT DETECTED NOT DETECTED Final  MRSA PCR Screening     Status: None  Collection Time: 07/27/16  1:10 AM  Result Value Ref Range Status   MRSA by PCR NEGATIVE NEGATIVE Final    Comment:        The GeneXpert MRSA Assay (FDA approved for NASAL specimens only), is one component of a comprehensive MRSA colonization surveillance program. It is not intended to diagnose MRSA infection nor to guide or monitor treatment for MRSA infections.   Cath Tip Culture     Status: Abnormal (Preliminary result)   Collection Time: 07/27/16 12:07 PM  Result Value Ref Range Status   Specimen Description CATH TIP  Final   Special Requests NONE  Final   Culture (A)  Final    >=100,000 COLONIES/mL METHICILLIN RESISTANT STAPHYLOCOCCUS AUREUS   Report Status PENDING  Incomplete   Organism ID, Bacteria METHICILLIN RESISTANT STAPHYLOCOCCUS AUREUS (A)  Final      Susceptibility   Methicillin resistant staphylococcus aureus - MIC*    CIPROFLOXACIN >=8 RESISTANT Resistant     ERYTHROMYCIN >=8 RESISTANT Resistant     GENTAMICIN <=0.5 SENSITIVE Sensitive     OXACILLIN >=4 RESISTANT Resistant     TETRACYCLINE <=1 SENSITIVE Sensitive     VANCOMYCIN 1 SENSITIVE Sensitive     TRIMETH/SULFA <=10 SENSITIVE Sensitive     CLINDAMYCIN RESISTANT Resistant     RIFAMPIN <=0.5 SENSITIVE Sensitive      Inducible Clindamycin POSITIVE Resistant     * >=100,000 COLONIES/mL METHICILLIN RESISTANT STAPHYLOCOCCUS AUREUS  CULTURE, BLOOD (ROUTINE X 2) w Reflex to ID Panel     Status: None (Preliminary result)   Collection Time: 07/28/16  1:59 PM  Result Value Ref Range Status   Specimen Description BLOOD  LEFT HAND  Final   Special Requests BOTTLES DRAWN AEROBIC AND ANAEROBIC  BCAV  Final   Culture NO GROWTH < 24 HOURS  Final   Report Status PENDING  Incomplete  CULTURE, BLOOD (ROUTINE X 2) w Reflex to ID Panel     Status: None (Preliminary result)   Collection Time: 07/28/16  2:10 PM  Result Value Ref Range Status   Specimen Description BLOOD  LEFT WRIST  Final   Special Requests BOTTLES DRAWN AEROBIC AND ANAEROBIC  BCAV  Final   Culture NO GROWTH < 24 HOURS  Final   Report Status PENDING  Incomplete    Coagulation Studies: No results for input(s): LABPROT, INR in the last 72 hours.  Urinalysis: No results for input(s): COLORURINE, LABSPEC, PHURINE, GLUCOSEU, HGBUR, BILIRUBINUR, KETONESUR, PROTEINUR, UROBILINOGEN, NITRITE, LEUKOCYTESUR in the last 72 hours.  Invalid input(s): APPERANCEUR    Imaging: No results found.   Medications:    . aspirin  325 mg Oral BH-q7a  . calcium acetate  667 mg Oral TID WC  . Chlorhexidine Gluconate Cloth  6 each Topical Q0600  . fluticasone  1 spray Each Nare Daily  . heparin subcutaneous  5,000 Units Subcutaneous Q8H  . insulin aspart  0-9 Units Subcutaneous TID WC  . midodrine  5 mg Oral Q M,W,F  . mometasone-formoterol  2 puff Inhalation BID  . multivitamin  1 tablet Oral QHS  . multivitamin with minerals  1 tablet Oral Daily  . mupirocin ointment  1 application Nasal BID  . pantoprazole  40 mg Oral Daily  . polyethylene glycol  17 g Oral BH-q7a  . sevelamer carbonate  800 mg Oral Q breakfast  . simvastatin  20 mg Oral Daily  . sodium chloride flush  3 mL Intravenous Q12H  . tiotropium  18 mcg Inhalation Daily  Assessment/ Plan:   [name] is a 61 y.o. black male [name] is a 61 y.o. black male with ESRD on hemodialysis, hypertension, hyperlipidemia, anemia, COPD, peripheral vascular disease, left BKA, right toe amputations  [facility].   1.  End stage renal disease with hyperkalemia on admission. Emergent hemodialysis on 3/2. No indication for dialysis today. Currently without access.  Complication of dialysis device. Tunnel infection. Catheter removed 3/3, he will need several catheter free days before a new tunneled catheter placed.  - vancomycin for MRSA in blood - Monitor daily for dialysis need.  - Appreciate Vascular and ID input. Earlean Polka cath to be placed by vascular surgery - dialysis today and re-evaluate for tomorrow  2.  Anemia of chronic kidney disease: hemoglobin 10.2 - consider EPO once Hgb < 10  3. Hypertension: blood pressure at goal.  - midodrine before dialysis.   4. Secondary Hyperparathyroidism: - calcium acetate with meals.   5. Sepsis: blood cultures positive with MRSA. Catheter tip culture MRSA  blood cultures on 3/4 are negative so far    PS: labs sow Hyperkalemia, Hyperphosphatemia, Azotemia - Will plan for HD on Tuesday also   LOS: 3 Anntonette Madewell 3/5/20186:49 PM

## 2016-07-29 NOTE — Progress Notes (Signed)
PT Cancellation Note  Patient Details Name: [name] MRN: [id number] DOB: [birth year]   Cancelled Treatment:    Reason Eval/Treat Not Completed: Other (comment)   Pt just returned from unit from temp dialysis cath placement RLE.  Lunch tray waiting.  Educated on precautions with temp cath RLE as he wanted to use it to reposition himself in the bed.  Pt voice he was not concerned with precautions.  Encouraged pt to follow them and assisted him with repositioning in bed.  Discussed with CNA who stated he was able to amb to/from bathroom with prosthesis well yesterday.   Chesley Noon 07/29/2016, 2:09 PM

## 2016-07-29 NOTE — Consult Note (Signed)
[facility] Infectious Disease     Reason for Consult: MRSA bacteremia   Referring Physician: Karlton Lemon Date of Admission:  07/26/2016   Principal Problem:   Sepsis [facility]) Active Problems:   Acute encephalopathy   Hyperkalemia   NSTEMI (non-ST elevated myocardial infarction) [facility])   HPI: [name] is a 61 y.o. male admitted with fevers and found to have MRSA bacteremia. He is on HD and has HD permacath in R chest which was removed on 07/27/16. He was due to have a L AVF placed but this has been postponed.  He at one point had neck pain but this has resolved. He had TTE negative.    Past Medical History:  Diagnosis Date  . Anemia   . Asthma   . CHF (congestive heart failure) ([place])   . Chronic systolic heart failure ([place])   . Complication of anesthesia    hypotension  . COPD (chronic obstructive pulmonary disease) ([place])   . Coronary artery disease   . Dialysis patient ([place])    Mon, Wed, Fri  . End stage renal disease ([place])   . GERD (gastroesophageal reflux disease)   . Headache   . History of kidney stones   . HLD (hyperlipidemia)   . HTN (hypertension)   . Hyperparathyroidism   . Myocardial infarction   . Peripheral vascular disease ([place])   . Shortness of breath dyspnea   . Sleep apnea   . Tobacco dependence    Past Surgical History:  Procedure Laterality Date  . AMPUTATION Left 05/06/2014   Procedure: AMPUTATION BELOW KNEE;  Surgeon: Elam Dutch, MD;  Location: [facility];  Service: Vascular;  Laterality: Left;  . AMPUTATION Right 01/12/2015   Procedure: Foot transmetatarsal amputation;  Surgeon: Algernon Huxley, MD;  Location: [facility];  Service: Vascular;  Laterality: Right;  . APPLICATION OF WOUND VAC Right 03/01/2015   Procedure: Application of Bio-connekt graft and wound vac application to right foot ;  Surgeon: Algernon Huxley, MD;  Location: [facility];  Service: Vascular;  Laterality: Right;  . AV FISTULA PLACEMENT Left   . CARDIAC CATHETERIZATION     stent  placement   . CORONARY ANGIOPLASTY    . LIGATION OF ARTERIOVENOUS  FISTULA Right 01/31/2016   Procedure: LIGATION OF ARTERIOVENOUS  FISTULA;  Surgeon: Algernon Huxley, MD;  Location: [facility];  Service: Vascular;  Laterality: Right;  . PERIPHERAL VASCULAR CATHETERIZATION Right 12/15/2014   Procedure: Lower Extremity Angiography;  Surgeon: Algernon Huxley, MD;  Location: [place] [facility];  Service: Cardiovascular;  Laterality: Right;  . PERIPHERAL VASCULAR CATHETERIZATION  12/15/2014   Procedure: Lower Extremity Intervention;  Surgeon: Algernon Huxley, MD;  Location: [place] [facility];  Service: Cardiovascular;;  . PERIPHERAL VASCULAR CATHETERIZATION Right 08/14/2015   Procedure: A/V Shuntogram/Fistulagram;  Surgeon: Algernon Huxley, MD;  Location: [place] [facility];  Service: Cardiovascular;  Laterality: Right;  . PERIPHERAL VASCULAR CATHETERIZATION N/A 08/14/2015   Procedure: A/V Shunt Intervention;  Surgeon: Algernon Huxley, MD;  Location: [place] [facility];  Service: Cardiovascular;  Laterality: N/A;  . PERIPHERAL VASCULAR CATHETERIZATION N/A 01/11/2016   Procedure: Dialysis/Perma Catheter Insertion;  Surgeon: Algernon Huxley, MD;  Location: [place] [facility];  Service: Cardiovascular;  Laterality: N/A;  . REVISON OF ARTERIOVENOUS FISTULA Right 02/17/2016   Procedure: removal of AV fistula;  Surgeon: Serafina Mitchell, MD;  Location: [facility];  Service: Vascular;  Laterality: Right;  . REVISON OF ARTERIOVENOUS FISTULA  Right 01/31/2016   Procedure: REVISON OF ARTERIOVENOUS FISTULA ( BRACHIOCEPHALIC ) W/ ARTEGRAFT;  Surgeon: Algernon Huxley, MD;  Location: [facility];  Service: Vascular;  Laterality: Right;  . TRANSMETATARSAL AMPUTATION Right 05/04/2015   Procedure: TRANSMETATARSAL AMPUTATION REVISION, great toe amputation;  Surgeon: Algernon Huxley, MD;  Location: [facility];  Service: Vascular;  Laterality: Right;   Social History  Substance Use Topics  . Smoking status: Light Tobacco Smoker    Packs/day: 0.25    Types:  Cigarettes  . Smokeless tobacco: Never Used     Comment: 5  . Alcohol use No   Family History  Problem Relation Age of Onset  . Heart failure Other   . Hypertension Other   . Leukemia Other   . Diabetes Other     Allergies:  Allergies  Allergen Reactions  . Dust Mite Extract     Current antibiotics: Antibiotics Given (last 72 hours)    Date/Time Action Medication Dose Rate   07/26/16 2148 Given   piperacillin-tazobactam (ZOSYN) IVPB 3.375 g 3.375 g 12.5 mL/hr   07/27/16 1040 Given   piperacillin-tazobactam (ZOSYN) IVPB 3.375 g 3.375 g 12.5 mL/hr   07/27/16 1530 Given   vancomycin (VANCOCIN) 500 mg in sodium chloride 0.9 % 100 mL IVPB 500 mg 100 mL/hr   07/27/16 2106 Given   piperacillin-tazobactam (ZOSYN) IVPB 3.375 g 3.375 g 12.5 mL/hr      MEDICATIONS: . aspirin  325 mg Oral BH-q7a  . calcium acetate  667 mg Oral TID WC  . Chlorhexidine Gluconate Cloth  6 each Topical Q0600  . fluticasone  1 spray Each Nare Daily  . heparin subcutaneous  5,000 Units Subcutaneous Q8H  . insulin aspart  0-9 Units Subcutaneous TID WC  . midodrine  5 mg Oral Q M,W,F  . mometasone-formoterol  2 puff Inhalation BID  . multivitamin  1 tablet Oral QHS  . multivitamin with minerals  1 tablet Oral Daily  . mupirocin ointment  1 application Nasal BID  . pantoprazole  40 mg Oral Daily  . polyethylene glycol  17 g Oral BH-q7a  . sevelamer carbonate  800 mg Oral Q breakfast  . simvastatin  20 mg Oral Daily  . sodium chloride flush  3 mL Intravenous Q12H  . tiotropium  18 mcg Inhalation Daily    Review of Systems - 11 systems reviewed and negative per HPI   OBJECTIVE: Temp:  [98 F (36.7 C)-98.7 F (37.1 C)] 98.1 F (36.7 C) (03/05 0901) Pulse Rate:  [86-91] 91 (03/05 0901) Resp:  [16-19] 18 (03/05 0901) BP: (89-116)/(68-78) 116/78 (03/05 0901) SpO2:  [100 %] 100 % (03/05 0901) Physical Exam  Constitutional: He is oriented to person, place, and time.chronically ill  appearing \HENT:  Mouth/Throat: Oropharynx is clear and moist. No oropharyngeal exudate.  Cardiovascular: Normal rate, regular rhythm2/6 sm Pulmonary/Chest: Effort normal and breath sounds normal. No respiratory distress. He has no wheezes.  Abdominal: Soft. Bowel sounds are normal. He exhibits no distension. There is no tenderness.  Lymphadenopathy: He has no cervical adenopathy.  Neurological: He is alert and oriented to person, place, and time.  Ext - R UE with non functioing AVF. R groin with venous HD cath in place R chest wall with prior HD cath site wnl Skin: Skin is warm and dry. No rash noted. No erythema.  Psychiatric: He has a normal mood and affect. His behavior is normal.     LABS: Results for orders placed or performed during the hospital  encounter of 07/26/16 (from the past 48 hour(s))  Glucose, capillary     Status: Abnormal   Collection Time: 07/27/16 11:39 AM  Result Value Ref Range   Glucose-Capillary 125 (H) 65 - 99 mg/dL  Cath Tip Culture     Status: Abnormal (Preliminary result)   Collection Time: 07/27/16 12:07 PM  Result Value Ref Range   Specimen Description CATH TIP    Special Requests NONE    Culture (A)     >=100,000 COLONIES/mL STAPHYLOCOCCUS AUREUS SUSCEPTIBILITIES TO FOLLOW Performed at [facility] [address], [place], [place] [address]    Report Status PENDING   Glucose, capillary     Status: None   Collection Time: 07/27/16  4:36 PM  Result Value Ref Range   Glucose-Capillary 87 65 - 99 mg/dL  Blood gas, arterial     Status: Abnormal   Collection Time: 07/27/16  5:19 PM  Result Value Ref Range   FIO2 0.28    pH, Arterial 7.51 (H) 7.350 - 7.450   pCO2 arterial 34 32.0 - 48.0 mmHg   pO2, Arterial 85 83.0 - 108.0 mmHg   Bicarbonate 27.1 20.0 - 28.0 mmol/L   Acid-Base Excess 4.2 (H) 0.0 - 2.0 mmol/L   O2 Saturation 97.3 %   Patient temperature 37.0    Collection site LEFT BRACHIAL    Sample type ARTERIAL DRAW    Allens test  (pass/fail) PASS PASS  Glucose, capillary     Status: Abnormal   Collection Time: 07/27/16  9:02 PM  Result Value Ref Range   Glucose-Capillary 122 (H) 65 - 99 mg/dL  Glucose, capillary     Status: None   Collection Time: 07/28/16  7:40 AM  Result Value Ref Range   Glucose-Capillary 84 65 - 99 mg/dL  Glucose, capillary     Status: None   Collection Time: 07/28/16 11:50 AM  Result Value Ref Range   Glucose-Capillary 90 65 - 99 mg/dL  CULTURE, BLOOD (ROUTINE X 2) w Reflex to ID Panel     Status: None (Preliminary result)   Collection Time: 07/28/16  1:59 PM  Result Value Ref Range   Specimen Description BLOOD  LEFT HAND    Special Requests BOTTLES DRAWN AEROBIC AND ANAEROBIC  BCAV    Culture NO GROWTH < 24 HOURS    Report Status PENDING   CULTURE, BLOOD (ROUTINE X 2) w Reflex to ID Panel     Status: None (Preliminary result)   Collection Time: 07/28/16  2:10 PM  Result Value Ref Range   Specimen Description BLOOD  LEFT WRIST    Special Requests BOTTLES DRAWN AEROBIC AND ANAEROBIC  BCAV    Culture NO GROWTH < 24 HOURS    Report Status PENDING   Glucose, capillary     Status: Abnormal   Collection Time: 07/28/16  5:07 PM  Result Value Ref Range   Glucose-Capillary 103 (H) 65 - 99 mg/dL  Glucose, capillary     Status: None   Collection Time: 07/28/16  9:15 PM  Result Value Ref Range   Glucose-Capillary 98 65 - 99 mg/dL  Glucose, capillary     Status: None   Collection Time: 07/29/16  7:34 AM  Result Value Ref Range   Glucose-Capillary 82 65 - 99 mg/dL   No components found for: ESR, C REACTIVE PROTEIN MICRO: Recent Results (from the past 720 hour(s))  Surgical pcr screen     Status: None   Collection Time: 07/25/16  1:03 PM  Result Value Ref Range Status   MRSA, PCR NEGATIVE NEGATIVE Final   Staphylococcus aureus NEGATIVE NEGATIVE Final    Comment:        The Xpert SA Assay (FDA approved for NASAL specimens in patients over 70 years of age), is one component of a  comprehensive surveillance program.  Test performance has been validated by [facility] for patients greater than or equal to 22 year old. It is not intended to diagnose infection nor to guide or monitor treatment.   Blood Culture (routine x 2)     Status: Abnormal   Collection Time: 07/26/16  8:08 AM  Result Value Ref Range Status   Specimen Description BLOOD LEFT ANTECUBITAL  Final   Special Requests BOTTLES DRAWN AEROBIC AND ANAEROBIC BCAV  Final   Culture  Setup Time   Final    GRAM POSITIVE COCCI ANAEROBIC BOTTLE ONLY CRITICAL VALUE NOTED.  VALUE IS CONSISTENT WITH PREVIOUSLY REPORTED AND CALLED VALUE.    Culture (A)  Final    STAPHYLOCOCCUS AUREUS SUSCEPTIBILITIES PERFORMED ON PREVIOUS CULTURE WITHIN THE LAST 5 DAYS. Performed at [facility], [place] [address] [place], [place] [address]    Report Status 07/29/2016 FINAL  Final  Blood Culture (routine x 2)     Status: Abnormal   Collection Time: 07/26/16  9:57 AM  Result Value Ref Range Status   Specimen Description BLOOD LEFT ARM  Final   Special Requests BOTTLES DRAWN AEROBIC AND ANAEROBIC BCHV  Final   Culture  Setup Time   Final    GRAM POSITIVE COCCI AEROBIC BOTTLE ONLY CRITICAL RESULT CALLED TO, READ BACK BY AND VERIFIED WITH: [name] COOKSON AT 2325 07/26/16.PMH Performed at [facility], [place] [address], [place], [place] [address]    Culture METHICILLIN RESISTANT STAPHYLOCOCCUS AUREUS (A)  Final   Report Status 07/29/2016 FINAL  Final   Organism ID, Bacteria METHICILLIN RESISTANT STAPHYLOCOCCUS AUREUS  Final      Susceptibility   Methicillin resistant staphylococcus aureus - MIC*    CIPROFLOXACIN >=8 RESISTANT Resistant     ERYTHROMYCIN >=8 RESISTANT Resistant     GENTAMICIN <=0.5 SENSITIVE Sensitive     OXACILLIN >=4 RESISTANT Resistant     TETRACYCLINE <=1 SENSITIVE Sensitive     VANCOMYCIN 1 SENSITIVE Sensitive     TRIMETH/SULFA <=10 SENSITIVE Sensitive     CLINDAMYCIN RESISTANT Resistant      RIFAMPIN <=0.5 SENSITIVE Sensitive     Inducible Clindamycin POSITIVE Resistant     * METHICILLIN RESISTANT STAPHYLOCOCCUS AUREUS  Blood Culture ID Panel (Reflexed)     Status: Abnormal   Collection Time: 07/26/16  9:57 AM  Result Value Ref Range Status   Enterococcus species NOT DETECTED NOT DETECTED Final   Listeria monocytogenes NOT DETECTED NOT DETECTED Final   Staphylococcus species DETECTED (A) NOT DETECTED Final    Comment: CRITICAL RESULT CALLED TO, READ BACK BY AND VERIFIED WITH: [name] COOKSON AT 2325 07/26/16.PMH    Staphylococcus aureus DETECTED (A) NOT DETECTED Final    Comment: Methicillin (oxacillin)-resistant Staphylococcus aureus (MRSA). MRSA is predictably resistant to beta-lactam antibiotics (except ceftaroline). Preferred therapy is vancomycin unless clinically contraindicated. Patient requires contact precautions if  hospitalized. CRITICAL RESULT CALLED TO, READ BACK BY AND VERIFIED WITH: [name] COOKSON AT 2325 07/26/16.PMH    Methicillin resistance DETECTED (A) NOT DETECTED Final    Comment: CRITICAL RESULT CALLED TO, READ BACK BY AND VERIFIED WITH: [name] COOKSON AT 2325 07/26/16.PMH    Streptococcus species NOT DETECTED  NOT DETECTED Final   Streptococcus agalactiae NOT DETECTED NOT DETECTED Final   Streptococcus pneumoniae NOT DETECTED NOT DETECTED Final   Streptococcus pyogenes NOT DETECTED NOT DETECTED Final   Acinetobacter baumannii NOT DETECTED NOT DETECTED Final   Enterobacteriaceae species NOT DETECTED NOT DETECTED Final   Enterobacter cloacae complex NOT DETECTED NOT DETECTED Final   Escherichia coli NOT DETECTED NOT DETECTED Final   Klebsiella oxytoca NOT DETECTED NOT DETECTED Final   Klebsiella pneumoniae NOT DETECTED NOT DETECTED Final   Proteus species NOT DETECTED NOT DETECTED Final   Serratia marcescens NOT DETECTED NOT DETECTED Final   Haemophilus influenzae NOT DETECTED NOT DETECTED Final   Neisseria meningitidis NOT DETECTED NOT DETECTED Final    Pseudomonas aeruginosa NOT DETECTED NOT DETECTED Final   Candida albicans NOT DETECTED NOT DETECTED Final   Candida glabrata NOT DETECTED NOT DETECTED Final   Candida krusei NOT DETECTED NOT DETECTED Final   Candida parapsilosis NOT DETECTED NOT DETECTED Final   Candida tropicalis NOT DETECTED NOT DETECTED Final  MRSA PCR Screening     Status: None   Collection Time: 07/27/16  1:10 AM  Result Value Ref Range Status   MRSA by PCR NEGATIVE NEGATIVE Final    Comment:        The GeneXpert MRSA Assay (FDA approved for NASAL specimens only), is one component of a comprehensive MRSA colonization surveillance program. It is not intended to diagnose MRSA infection nor to guide or monitor treatment for MRSA infections.   Cath Tip Culture     Status: Abnormal (Preliminary result)   Collection Time: 07/27/16 12:07 PM  Result Value Ref Range Status   Specimen Description CATH TIP  Final   Special Requests NONE  Final   Culture (A)  Final    >=100,000 COLONIES/mL STAPHYLOCOCCUS AUREUS SUSCEPTIBILITIES TO FOLLOW Performed at [facility], [place] [address] [place], [place] [address]    Report Status PENDING  Incomplete  CULTURE, BLOOD (ROUTINE X 2) w Reflex to ID Panel     Status: None (Preliminary result)   Collection Time: 07/28/16  1:59 PM  Result Value Ref Range Status   Specimen Description BLOOD  LEFT HAND  Final   Special Requests BOTTLES DRAWN AEROBIC AND ANAEROBIC  BCAV  Final   Culture NO GROWTH < 24 HOURS  Final   Report Status PENDING  Incomplete  CULTURE, BLOOD (ROUTINE X 2) w Reflex to ID Panel     Status: None (Preliminary result)   Collection Time: 07/28/16  2:10 PM  Result Value Ref Range Status   Specimen Description BLOOD  LEFT WRIST  Final   Special Requests BOTTLES DRAWN AEROBIC AND ANAEROBIC  BCAV  Final   Culture NO GROWTH < 24 HOURS  Final   Report Status PENDING  Incomplete    IMAGING: X-ray Chest Pa Or Ap  Result Date: 07/25/2016 CLINICAL DATA:  Preop  vascular procedure EXAM: CHEST 1 VIEW COMPARISON:  05/21/2016 FINDINGS: Trace right pleural effusion. Bilateral mild interstitial thickening. No pneumothorax. No focal consolidation. Stable cardiomegaly. Dual-lumen right-sided central venous catheter in unchanged position. No acute osseous abnormality. IMPRESSION: Cardiomegaly with mild pulmonary vascular congestion. Electronically Signed   By: Kathreen Devoid   On: 07/25/2016 13:36   Ct Head Wo Contrast  Result Date: 07/27/2016 CLINICAL DATA:  Confusion, fever. EXAM: CT HEAD WITHOUT CONTRAST TECHNIQUE: Contiguous axial images were obtained from the base of the skull through the vertex without intravenous contrast. COMPARISON:  None. FINDINGS: Brain: There is  generalized parenchymal atrophy with commensurate dilatation of the ventricles and sulci. Mild chronic small vessel ischemic change noted within the periventricular white matter regions bilaterally. There is no mass, hemorrhage, edema or other evidence of acute parenchymal abnormality. No extra-axial hemorrhage. Vascular: There are chronic calcified atherosclerotic changes of the large vessels at the skull base. No unexpected hyperdense vessel. Skull: Normal. Negative for fracture or focal lesion. Sinuses/Orbits: Paranasal sinuses are clear. Periorbital and retro-orbital soft tissues are unremarkable. Other: None. IMPRESSION: 1. No acute findings.  No intracranial mass, hemorrhage or edema. 2. Atrophy and mild chronic small vessel ischemic changes in the periventricular white matter. Electronically Signed   By: Franki Cabot M.D.   On: 07/27/2016 18:21   Dg Chest Port 1 View  Result Date: 07/26/2016 CLINICAL DATA:  Chest pain.  Dialysis patient. EXAM: PORTABLE CHEST 1 VIEW COMPARISON:  07/25/2016 FINDINGS: Central venous catheter tip remains in the lower SVC unchanged. Small right pleural effusion unchanged. Mild bibasilar atelectasis unchanged. Decreased lung volume. Negative for edema. IMPRESSION: Small  right pleural effusion and mild right lower lobe atelectasis unchanged. Hypoventilation with decreased lung volume. Electronically Signed   By: Franchot Gallo M.D.   On: 07/26/2016 07:17    Assessment:   ROWLAND ERICSSON is a 61 y.o. male with ESRD on HD admitted with fevers chills and found to have MRSA bacteremia from HD cath as likely source.  HD cath removed, fu bcx ngtd from 3/4. Temp cath placed R groin 3/5 TTE neg.  Recommendations Would rec 4 week course of Vanco at HD from neg culture date of 3/4.   WIll need to review placement of tunneled cath or placement of AVF with renal and vascular.   Thank you very much for allowing me to participate in the care of this patient. Please call with questions.   Cheral Marker. Ola Spurr, MD

## 2016-07-29 NOTE — Plan of Care (Signed)
Problem: Safety: Goal: Ability to remain free from injury will improve Outcome: Progressing Patient high fall risk, all safety precautions in place. Patient educated on calling for assistance before ambulating, verbalized understanding. Will continue to monitor.

## 2016-07-29 NOTE — Op Note (Signed)
  OPERATIVE NOTE   PROCEDURE: 1. Ultrasound guidance for vascular access right femoral vein 2. Placement of a 30 cm triple lumen dialysis catheter right femoral vein  PRE-OPERATIVE DIAGNOSIS: 1. ESRD with recent permcath removal for infection  POST-OPERATIVE DIAGNOSIS: Same  SURGEON: Leotis Pain, MD  ASSISTANT(S): None  ANESTHESIA: local  ESTIMATED BLOOD LOSS: Minimal   FINDING(S): 1. None  SPECIMEN(S): None  INDICATIONS:  Patient is a 61 y.o.male who presents with permcath infection and subsequent removal.  He has ESRD and can not have a new long term access placed yet so will need a temp cath.  Risks and benefits were discussed, and informed consent was obtained..  DESCRIPTION: After obtaining full informed written consent, the patient was laid flat in the bed. The right groin was sterilely prepped and draped in a sterile surgical field was created. The right femoral vein was visualized with ultrasound and found to be widely patent. It was then accessed under direct guidance without difficulty with a Seldinger needle and a permanent image was recorded. A J-wire was then placed. After skin nick and dilatation, a 30 cm Trialysis type dialysis catheter was placed over the wire and the wire was removed. The lumens withdrew dark red nonpulsatile blood and flushed easily with sterile saline. The catheter was secured to the skin with 3 nylon sutures. Sterile dressing was placed.  COMPLICATIONS: None  CONDITION: Stable  Leotis Pain 07/29/2016 1:08 PM  This note was created with Dragon Medical transcription system. Any errors in dictation are purely unintentional.

## 2016-07-30 LAB — CBC
HCT: 31.2 % — ABNORMAL LOW (ref 40.0–52.0)
HCT: 31.2 % — ABNORMAL LOW (ref 40.0–52.0)
Hemoglobin: 10.5 g/dL — ABNORMAL LOW (ref 13.0–18.0)
Hemoglobin: 10.4 g/dL — ABNORMAL LOW (ref 13.0–18.0)
MCH: 31.4 pg (ref 26.0–34.0)
MCH: 31.2 pg (ref 26.0–34.0)
MCHC: 33.8 g/dL (ref 32.0–36.0)
MCHC: 33.4 g/dL (ref 32.0–36.0)
MCV: 92.9 fL (ref 80.0–100.0)
MCV: 93.4 fL (ref 80.0–100.0)
Platelets: 120 10*3/uL — ABNORMAL LOW (ref 150–440)
Platelets: 126 10*3/uL — ABNORMAL LOW (ref 150–440)
RBC: 3.36 MIL/uL — ABNORMAL LOW (ref 4.40–5.90)
RBC: 3.34 MIL/uL — AB (ref 4.40–5.90)
RDW: 16.5 % — AB (ref 11.5–14.5)
RDW: 16.2 % — ABNORMAL HIGH (ref 11.5–14.5)
WBC: 5.6 10*3/uL (ref 3.8–10.6)
WBC: 5.8 10*3/uL (ref 3.8–10.6)

## 2016-07-30 LAB — RENAL FUNCTION PANEL
ALBUMIN: 2.4 g/dL — AB (ref 3.5–5.0)
ANION GAP: 11 (ref 5–15)
BUN: 60 mg/dL — ABNORMAL HIGH (ref 6–20)
CALCIUM: 8.7 mg/dL — AB (ref 8.9–10.3)
CO2: 29 mmol/L (ref 22–32)
Chloride: 97 mmol/L — ABNORMAL LOW (ref 101–111)
Creatinine, Ser: 9.7 mg/dL — ABNORMAL HIGH (ref 0.61–1.24)
GFR, EST AFRICAN AMERICAN: 6 mL/min — AB (ref >60)
GFR, EST NON AFRICAN AMERICAN: 5 mL/min — AB (ref >60)
Glucose, Bld: 99 mg/dL (ref 65–99)
PHOSPHORUS: 7.5 mg/dL — AB (ref 2.5–4.6)
Potassium: 4.9 mmol/L (ref 3.5–5.1)
SODIUM: 137 mmol/L (ref 135–145)

## 2016-07-30 LAB — GLUCOSE, CAPILLARY
GLUCOSE-CAPILLARY: 102 mg/dL — AB (ref 65–99)
GLUCOSE-CAPILLARY: 107 mg/dL — AB (ref 65–99)
Glucose-Capillary: 113 mg/dL — ABNORMAL HIGH (ref 65–99)

## 2016-07-30 LAB — CATH TIP CULTURE: Culture: >=100000 — AB

## 2016-07-30 MED ORDER — VANCOMYCIN HCL IN DEXTROSE 750-5 MG/150ML-% IV SOLN
750.0000 mg | Freq: Once | INTRAVENOUS | Status: AC
  Administered 2016-07-30: 750 mg via INTRAVENOUS
  Filled 2016-07-30: qty 150

## 2016-07-30 MED ORDER — VANCOMYCIN HCL IN DEXTROSE 750-5 MG/150ML-% IV SOLN
750.0000 mg | INTRAVENOUS | Status: DC
  Filled 2016-07-30 (×2): qty 150

## 2016-07-30 NOTE — Progress Notes (Signed)
Infectious Disease Long Term IV Antibiotic Orders  Diagnosis: MRSA bacteremia Culture results Results for orders placed or performed during the hospital encounter of 07/26/16  Blood Culture (routine x 2)     Status: Abnormal   Collection Time: 07/26/16  8:08 AM  Result Value Ref Range Status   Specimen Description BLOOD LEFT ANTECUBITAL  Final   Special Requests BOTTLES DRAWN AEROBIC AND ANAEROBIC BCAV  Final   Culture  Setup Time   Final    GRAM POSITIVE COCCI ANAEROBIC BOTTLE ONLY CRITICAL VALUE NOTED.  VALUE IS CONSISTENT WITH PREVIOUSLY REPORTED AND CALLED VALUE.    Culture (A)  Final    STAPHYLOCOCCUS AUREUS SUSCEPTIBILITIES PERFORMED ON PREVIOUS CULTURE WITHIN THE LAST 5 DAYS. Performed at [facility], [place] [address], [place], [place] [address]    Report Status 07/29/2016 FINAL  Final  Blood Culture (routine x 2)     Status: Abnormal   Collection Time: 07/26/16  9:57 AM  Result Value Ref Range Status   Specimen Description BLOOD LEFT ARM  Final   Special Requests BOTTLES DRAWN AEROBIC AND ANAEROBIC BCHV  Final   Culture  Setup Time   Final    GRAM POSITIVE COCCI AEROBIC BOTTLE ONLY CRITICAL RESULT CALLED TO, READ BACK BY AND VERIFIED WITH: [name] COOKSON AT 2325 07/26/16.PMH Performed at [facility], [place] [address] [place], [place] [address]    Culture METHICILLIN RESISTANT STAPHYLOCOCCUS AUREUS (A)  Final   Report Status 07/29/2016 FINAL  Final   Organism ID, Bacteria METHICILLIN RESISTANT STAPHYLOCOCCUS AUREUS  Final      Susceptibility   Methicillin resistant staphylococcus aureus - MIC*    CIPROFLOXACIN >=8 RESISTANT Resistant     ERYTHROMYCIN >=8 RESISTANT Resistant     GENTAMICIN <=0.5 SENSITIVE Sensitive     OXACILLIN >=4 RESISTANT Resistant     TETRACYCLINE <=1 SENSITIVE Sensitive     VANCOMYCIN 1 SENSITIVE Sensitive     TRIMETH/SULFA <=10 SENSITIVE Sensitive     CLINDAMYCIN RESISTANT Resistant     RIFAMPIN <=0.5 SENSITIVE Sensitive     Inducible  Clindamycin POSITIVE Resistant     * METHICILLIN RESISTANT STAPHYLOCOCCUS AUREUS  Blood Culture ID Panel (Reflexed)     Status: Abnormal   Collection Time: 07/26/16  9:57 AM  Result Value Ref Range Status   Enterococcus species NOT DETECTED NOT DETECTED Final   Listeria monocytogenes NOT DETECTED NOT DETECTED Final   Staphylococcus species DETECTED (A) NOT DETECTED Final    Comment: CRITICAL RESULT CALLED TO, READ BACK BY AND VERIFIED WITH: [name] COOKSON AT 2325 07/26/16.PMH    Staphylococcus aureus DETECTED (A) NOT DETECTED Final    Comment: Methicillin (oxacillin)-resistant Staphylococcus aureus (MRSA). MRSA is predictably resistant to beta-lactam antibiotics (except ceftaroline). Preferred therapy is vancomycin unless clinically contraindicated. Patient requires contact precautions if  hospitalized. CRITICAL RESULT CALLED TO, READ BACK BY AND VERIFIED WITH: [name] COOKSON AT 2325 07/26/16.PMH    Methicillin resistance DETECTED (A) NOT DETECTED Final    Comment: CRITICAL RESULT CALLED TO, READ BACK BY AND VERIFIED WITH: [name] COOKSON AT 2325 07/26/16.PMH    Streptococcus species NOT DETECTED NOT DETECTED Final   Streptococcus agalactiae NOT DETECTED NOT DETECTED Final   Streptococcus pneumoniae NOT DETECTED NOT DETECTED Final   Streptococcus pyogenes NOT DETECTED NOT DETECTED Final   Acinetobacter baumannii NOT DETECTED NOT DETECTED Final   Enterobacteriaceae species NOT DETECTED NOT DETECTED Final   Enterobacter cloacae complex NOT DETECTED NOT DETECTED Final   Escherichia coli NOT  DETECTED NOT DETECTED Final   Klebsiella oxytoca NOT DETECTED NOT DETECTED Final   Klebsiella pneumoniae NOT DETECTED NOT DETECTED Final   Proteus species NOT DETECTED NOT DETECTED Final   Serratia marcescens NOT DETECTED NOT DETECTED Final   Haemophilus influenzae NOT DETECTED NOT DETECTED Final   Neisseria meningitidis NOT DETECTED NOT DETECTED Final   Pseudomonas aeruginosa NOT DETECTED NOT DETECTED Final    Candida albicans NOT DETECTED NOT DETECTED Final   Candida glabrata NOT DETECTED NOT DETECTED Final   Candida krusei NOT DETECTED NOT DETECTED Final   Candida parapsilosis NOT DETECTED NOT DETECTED Final   Candida tropicalis NOT DETECTED NOT DETECTED Final  MRSA PCR Screening     Status: None   Collection Time: 07/27/16  1:10 AM  Result Value Ref Range Status   MRSA by PCR NEGATIVE NEGATIVE Final    Comment:        The GeneXpert MRSA Assay (FDA approved for NASAL specimens only), is one component of a comprehensive MRSA colonization surveillance program. It is not intended to diagnose MRSA infection nor to guide or monitor treatment for MRSA infections.   Cath Tip Culture     Status: Abnormal (Preliminary result)   Collection Time: 07/27/16 12:07 PM  Result Value Ref Range Status   Specimen Description CATH TIP  Final   Special Requests NONE  Final   Culture (A)  Final    >=100,000 COLONIES/mL METHICILLIN RESISTANT STAPHYLOCOCCUS AUREUS   Report Status PENDING  Incomplete   Organism ID, Bacteria METHICILLIN RESISTANT STAPHYLOCOCCUS AUREUS (A)  Final      Susceptibility   Methicillin resistant staphylococcus aureus - MIC*    CIPROFLOXACIN >=8 RESISTANT Resistant     ERYTHROMYCIN >=8 RESISTANT Resistant     GENTAMICIN <=0.5 SENSITIVE Sensitive     OXACILLIN >=4 RESISTANT Resistant     TETRACYCLINE <=1 SENSITIVE Sensitive     VANCOMYCIN 1 SENSITIVE Sensitive     TRIMETH/SULFA <=10 SENSITIVE Sensitive     CLINDAMYCIN RESISTANT Resistant     RIFAMPIN <=0.5 SENSITIVE Sensitive     Inducible Clindamycin POSITIVE Resistant     * >=100,000 COLONIES/mL METHICILLIN RESISTANT STAPHYLOCOCCUS AUREUS  CULTURE, BLOOD (ROUTINE X 2) w Reflex to ID Panel     Status: None (Preliminary result)   Collection Time: 07/28/16  1:59 PM  Result Value Ref Range Status   Specimen Description BLOOD  LEFT HAND  Final   Special Requests BOTTLES DRAWN AEROBIC AND ANAEROBIC  BCAV  Final   Culture NO  GROWTH 2 DAYS  Final   Report Status PENDING  Incomplete  CULTURE, BLOOD (ROUTINE X 2) w Reflex to ID Panel     Status: None (Preliminary result)   Collection Time: 07/28/16  2:10 PM  Result Value Ref Range Status   Specimen Description BLOOD  LEFT WRIST  Final   Special Requests BOTTLES DRAWN AEROBIC AND ANAEROBIC  BCAV  Final   Culture NO GROWTH 2 DAYS  Final   Report Status PENDING  Incomplete     Allergies:  Allergies  Allergen Reactions  . Dust Mite Extract     Discharge antibiotics Vancomycin           750       mg  every     MWF hemodialysis Goal vancomycin trough 15-20.    Pharmacy to adjust dosing based on levels  PICC Care per protocol Labs weekly while on IV antibiotics      CBC w diff   Comprehensive  met panel Vancomycin Trough    Planned duration of antibiotics 4 weeks from 07/28/16  Stop date 08/25/16  Follow up clinic date  Follows with renal at HD - no need to see ID in fu   FAX weekly labs to [phone]  Leonel Ramsay, MD

## 2016-07-30 NOTE — Progress Notes (Signed)
[facility] INFECTIOUS DISEASE PROGRESS NOTE Date of Admission:  07/26/2016     ID: [name] is a 61 y.o. male with  MRSA bacteremia Principal Problem:   Sepsis ([place]) Active Problems:   Acute encephalopathy   Hyperkalemia   NSTEMI (non-ST elevated myocardial infarction) ([place])   Subjective: No fevers, tolerating HD  ROS  Eleven systems are reviewed and negative except per hpi  Medications:  Antibiotics Given (last 72 hours)    Date/Time Action Medication Dose Rate   07/27/16 2106 Given   piperacillin-tazobactam (ZOSYN) IVPB 3.375 g 3.375 g 12.5 mL/hr   07/29/16 1848 Given   vancomycin (VANCOCIN) IVPB 750 mg/150 ml premix 750 mg 150 mL/hr   07/30/16 1343 Given   vancomycin (VANCOCIN) IVPB 750 mg/150 ml premix 750 mg 150 mL/hr     . aspirin  325 mg Oral BH-q7a  . calcium acetate  667 mg Oral TID WC  . Chlorhexidine Gluconate Cloth  6 each Topical Q0600  . fluticasone  1 spray Each Nare Daily  . heparin subcutaneous  5,000 Units Subcutaneous Q8H  . insulin aspart  0-9 Units Subcutaneous TID WC  . midodrine  5 mg Oral Q M,W,F  . mometasone-formoterol  2 puff Inhalation BID  . multivitamin  1 tablet Oral QHS  . multivitamin with minerals  1 tablet Oral Daily  . mupirocin ointment  1 application Nasal BID  . pantoprazole  40 mg Oral Daily  . polyethylene glycol  17 g Oral BH-q7a  . sevelamer carbonate  800 mg Oral Q breakfast  . simvastatin  20 mg Oral Daily  . sodium chloride flush  3 mL Intravenous Q12H  . tiotropium  18 mcg Inhalation Daily  . [START ON 07/31/2016] vancomycin  750 mg Intravenous Q M,W,F-HD    Objective: Vital signs in last 24 hours: Temp:  [97.7 F (36.5 C)-99.3 F (37.4 C)] 97.7 F (36.5 C) (03/06 1417) Pulse Rate:  [63-86] 67 (03/06 1420) Resp:  [10-18] 15 (03/06 1420) BP: (74-119)/(62-95) 119/74 (03/06 1420) SpO2:  [98 %-100 %] 100 % (03/06 1420) Weight:  [80.8 kg (178 lb 2.1 oz)-82.9 kg (182 lb 12.2 oz)] 80.8 kg (178 lb 2.1 oz) (03/06  1417) Constitutional: He is oriented to person, place, and time.chronically ill appearing \\HENT :  Mouth/Throat: Oropharynx is clear and moist. No oropharyngeal exudate.  Cardiovascular: Normal rate, regular rhythm2/6 sm Pulmonary/Chest: Effort normal and breath sounds normal. No respiratory distress. He has no wheezes.  Abdominal: Soft. Bowel sounds are normal. He exhibits no distension. There is no tenderness.  Lymphadenopathy: He has no cervical adenopathy.  Neurological: He is alert and oriented to person, place, and time.  Ext - R UE with non functioing AVF. R groin with venous HD cath in place R chest wall with prior HD cath site wnl Skin: Skin is warm and dry. No rash noted. No erythema.  Psychiatric: He has a normal mood and affect. His behavior is normal.   Lab Results  Recent Labs  07/29/16 1649 07/30/16 0451 07/30/16 1030 07/30/16 1109  WBC 6.2 5.6 5.8  --   HGB 10.2* 10.5* 10.4*  --   HCT 30.5* 31.2* 31.2*  --   NA 137  --   --  137  K 6.0*  --   --  4.9  CL 96*  --   --  97*  CO2 24  --   --  29  BUN 120*  --   --  60*  CREATININE 15.92*  --   --  9.70*    Microbiology: Results for orders placed or performed during the hospital encounter of 07/26/16  Blood Culture (routine x 2)     Status: Abnormal   Collection Time: 07/26/16  8:08 AM  Result Value Ref Range Status   Specimen Description BLOOD LEFT ANTECUBITAL  Final   Special Requests BOTTLES DRAWN AEROBIC AND ANAEROBIC BCAV  Final   Culture  Setup Time   Final    GRAM POSITIVE COCCI ANAEROBIC BOTTLE ONLY CRITICAL VALUE NOTED.  VALUE IS CONSISTENT WITH PREVIOUSLY REPORTED AND CALLED VALUE.    Culture (A)  Final    STAPHYLOCOCCUS AUREUS SUSCEPTIBILITIES PERFORMED ON PREVIOUS CULTURE WITHIN THE LAST 5 DAYS. Performed at [facility], [place] [address], [place], [place] [address]    Report Status 07/29/2016 FINAL  Final  Blood Culture (routine x 2)     Status: Abnormal   Collection Time: 07/26/16   9:57 AM  Result Value Ref Range Status   Specimen Description BLOOD LEFT ARM  Final   Special Requests BOTTLES DRAWN AEROBIC AND ANAEROBIC BCHV  Final   Culture  Setup Time   Final    GRAM POSITIVE COCCI AEROBIC BOTTLE ONLY CRITICAL RESULT CALLED TO, READ BACK BY AND VERIFIED WITH: [name] COOKSON AT 2325 07/26/16.PMH Performed at [facility], [place] [address] [place], [place] [address]    Culture METHICILLIN RESISTANT STAPHYLOCOCCUS AUREUS (A)  Final   Report Status 07/29/2016 FINAL  Final   Organism ID, Bacteria METHICILLIN RESISTANT STAPHYLOCOCCUS AUREUS  Final      Susceptibility   Methicillin resistant staphylococcus aureus - MIC*    CIPROFLOXACIN >=8 RESISTANT Resistant     ERYTHROMYCIN >=8 RESISTANT Resistant     GENTAMICIN <=0.5 SENSITIVE Sensitive     OXACILLIN >=4 RESISTANT Resistant     TETRACYCLINE <=1 SENSITIVE Sensitive     VANCOMYCIN 1 SENSITIVE Sensitive     TRIMETH/SULFA <=10 SENSITIVE Sensitive     CLINDAMYCIN RESISTANT Resistant     RIFAMPIN <=0.5 SENSITIVE Sensitive     Inducible Clindamycin POSITIVE Resistant     * METHICILLIN RESISTANT STAPHYLOCOCCUS AUREUS  Blood Culture ID Panel (Reflexed)     Status: Abnormal   Collection Time: 07/26/16  9:57 AM  Result Value Ref Range Status   Enterococcus species NOT DETECTED NOT DETECTED Final   Listeria monocytogenes NOT DETECTED NOT DETECTED Final   Staphylococcus species DETECTED (A) NOT DETECTED Final    Comment: CRITICAL RESULT CALLED TO, READ BACK BY AND VERIFIED WITH: [name] COOKSON AT 2325 07/26/16.PMH    Staphylococcus aureus DETECTED (A) NOT DETECTED Final    Comment: Methicillin (oxacillin)-resistant Staphylococcus aureus (MRSA). MRSA is predictably resistant to beta-lactam antibiotics (except ceftaroline). Preferred therapy is vancomycin unless clinically contraindicated. Patient requires contact precautions if  hospitalized. CRITICAL RESULT CALLED TO, READ BACK BY AND VERIFIED WITH: [name] COOKSON AT 2325  07/26/16.PMH    Methicillin resistance DETECTED (A) NOT DETECTED Final    Comment: CRITICAL RESULT CALLED TO, READ BACK BY AND VERIFIED WITH: [name] COOKSON AT 2325 07/26/16.PMH    Streptococcus species NOT DETECTED NOT DETECTED Final   Streptococcus agalactiae NOT DETECTED NOT DETECTED Final   Streptococcus pneumoniae NOT DETECTED NOT DETECTED Final   Streptococcus pyogenes NOT DETECTED NOT DETECTED Final   Acinetobacter baumannii NOT DETECTED NOT DETECTED Final   Enterobacteriaceae species NOT DETECTED NOT DETECTED Final   Enterobacter cloacae complex NOT DETECTED NOT DETECTED Final   Escherichia coli NOT DETECTED NOT DETECTED Final   Klebsiella  oxytoca NOT DETECTED NOT DETECTED Final   Klebsiella pneumoniae NOT DETECTED NOT DETECTED Final   Proteus species NOT DETECTED NOT DETECTED Final   Serratia marcescens NOT DETECTED NOT DETECTED Final   Haemophilus influenzae NOT DETECTED NOT DETECTED Final   Neisseria meningitidis NOT DETECTED NOT DETECTED Final   Pseudomonas aeruginosa NOT DETECTED NOT DETECTED Final   Candida albicans NOT DETECTED NOT DETECTED Final   Candida glabrata NOT DETECTED NOT DETECTED Final   Candida krusei NOT DETECTED NOT DETECTED Final   Candida parapsilosis NOT DETECTED NOT DETECTED Final   Candida tropicalis NOT DETECTED NOT DETECTED Final  MRSA PCR Screening     Status: None   Collection Time: 07/27/16  1:10 AM  Result Value Ref Range Status   MRSA by PCR NEGATIVE NEGATIVE Final    Comment:        The GeneXpert MRSA Assay (FDA approved for NASAL specimens only), is one component of a comprehensive MRSA colonization surveillance program. It is not intended to diagnose MRSA infection nor to guide or monitor treatment for MRSA infections.   Cath Tip Culture     Status: Abnormal   Collection Time: 07/27/16 12:07 PM  Result Value Ref Range Status   Specimen Description CATH TIP  Final   Special Requests NONE  Final   Culture (A)  Final    >=100,000  COLONIES/mL METHICILLIN RESISTANT STAPHYLOCOCCUS AUREUS   Report Status 07/30/2016 FINAL  Final   Organism ID, Bacteria METHICILLIN RESISTANT STAPHYLOCOCCUS AUREUS (A)  Final      Susceptibility   Methicillin resistant staphylococcus aureus - MIC*    CIPROFLOXACIN >=8 RESISTANT Resistant     ERYTHROMYCIN >=8 RESISTANT Resistant     GENTAMICIN <=0.5 SENSITIVE Sensitive     OXACILLIN >=4 RESISTANT Resistant     TETRACYCLINE <=1 SENSITIVE Sensitive     VANCOMYCIN 1 SENSITIVE Sensitive     TRIMETH/SULFA <=10 SENSITIVE Sensitive     CLINDAMYCIN RESISTANT Resistant     RIFAMPIN <=0.5 SENSITIVE Sensitive     Inducible Clindamycin POSITIVE Resistant     * >=100,000 COLONIES/mL METHICILLIN RESISTANT STAPHYLOCOCCUS AUREUS  CULTURE, BLOOD (ROUTINE X 2) w Reflex to ID Panel     Status: None (Preliminary result)   Collection Time: 07/28/16  1:59 PM  Result Value Ref Range Status   Specimen Description BLOOD  LEFT HAND  Final   Special Requests BOTTLES DRAWN AEROBIC AND ANAEROBIC  BCAV  Final   Culture NO GROWTH 2 DAYS  Final   Report Status PENDING  Incomplete  CULTURE, BLOOD (ROUTINE X 2) w Reflex to ID Panel     Status: None (Preliminary result)   Collection Time: 07/28/16  2:10 PM  Result Value Ref Range Status   Specimen Description BLOOD  LEFT WRIST  Final   Special Requests BOTTLES DRAWN AEROBIC AND ANAEROBIC  BCAV  Final   Culture NO GROWTH 2 DAYS  Final   Report Status PENDING  Incomplete    Studies/Results: No results found.  Assessment/Plan: LAVERNE KLUGH is a 61 y.o. male with ESRD on HD admitted with fevers chills and found to have MRSA bacteremia from HD cath as likely source.  HD cath removed, fu bcx ngtd from 3/4. Temp cath placed R groin 3/5 TTE neg. FU BCX neg 07/28/16 Recommendations Would rec 4 week course of Vanco at HD from neg culture date of 3/4.   Could place the  tunneled cath and/or placement of AVF when renal and vascular ok with it  as long as bcx neg and  remains afebrile Thank you very much for the consult. Will follow with you.  Tessi Eustache P   07/30/2016, 3:50 PM

## 2016-07-30 NOTE — Progress Notes (Signed)
Pre hd assessment  

## 2016-07-30 NOTE — Progress Notes (Signed)
Post hd assessment 

## 2016-07-30 NOTE — Progress Notes (Signed)
Start of hd 

## 2016-07-30 NOTE — Progress Notes (Signed)
  End of hd 

## 2016-07-30 NOTE — Clinical Social Work Note (Signed)
Patient is from [facility] as a long term care resident, and can accept patient back once he is medically ready for discharge and orders have been received.  Patient transferred from 90 to 98, this CSW to sign off, handoff given to unit CSW.  Jones Broom. [place], MSW, [place]  07/30/2016 9:24 AM

## 2016-07-30 NOTE — Care Management (Signed)
[facility] HD liaison notified of admission.  Patient MWF [facility].

## 2016-07-30 NOTE — Progress Notes (Signed)
Post hd vitals 

## 2016-07-30 NOTE — Progress Notes (Signed)
[facility]  ROUNDING NOTE   Subjective:   Patient is doing fair overall.  Patient seen during dialysis Tolerating well    HEMODIALYSIS FLOWSHEET:  Blood Flow Rate (mL/min): 350 mL/min Arterial Pressure (mmHg): -150 mmHg Venous Pressure (mmHg): 160 mmHg Transmembrane Pressure (mmHg): 40 mmHg Ultrafiltration Rate (mL/min): 570 mL/min Dialysate Flow Rate (mL/min): 800 ml/min Conductivity: Machine : 13.7 Conductivity: Machine : 13.7 Dialysis Fluid Bolus: Normal Saline Bolus Amount (mL): 250 mL Dialysate Change: 2K Intra-Hemodialysis Comments: 1600. ended    Objective:  Vital signs in last 24 hours:  Temp:  [97.7 F (36.5 C)-99.3 F (37.4 C)] 97.7 F (36.5 C) (03/06 1417) Pulse Rate:  [63-86] 67 (03/06 1420) Resp:  [10-18] 15 (03/06 1420) BP: (74-119)/(62-95) 119/74 (03/06 1420) SpO2:  [98 %-100 %] 100 % (03/06 1420) Weight:  [80.8 kg (178 lb 2.1 oz)-82.9 kg (182 lb 12.2 oz)] 80.8 kg (178 lb 2.1 oz) (03/06 1417)  Weight change:  Filed Weights   07/30/16 0500 07/30/16 1015 07/30/16 1417  Weight: 81.1 kg (178 lb 12.8 oz) 81.9 kg (180 lb 8.9 oz) 80.8 kg (178 lb 2.1 oz)    Intake/Output: I/O last 3 completed shifts: In: 223 [P.O.:220; I.V.:3] Out: 1150 [Other:1150]   Intake/Output this shift:  Total I/O In: 200 [P.O.:200] Out: 950 [Other:950]  Physical Exam: General: Laying in bed  Head: Normocephalic, atraumatic. Moist oral mucosal membranes  Eyes: Anicteric,   Neck: Supple, trachea midline  Lungs:  Clear to auscultation  Heart: Regular rate and rhythm  Abdomen:  Soft, nontender  Extremities: Left BKA, right toe amputations  Neurologic: Nonfocal, moving all four extremities  Skin: No lesions  Access: Femoral temp dialysis cathter    Basic Metabolic Panel:  Recent Labs Lab 07/25/16 1306 07/25/16 1359 07/26/16 0645 07/27/16 0139 07/29/16 1649 07/30/16 1109  NA 137 137 135 136 137 137  K 5.6* 5.8* 7.0* 5.1 6.0* 4.9  CL 100*  --  103  96* 96* 97*  CO2 23  --  19* 28 24 29   GLUCOSE 95 91 94 125* 93 99  BUN 70*  --  91* 58* 120* 60*  CREATININE 10.31*  --  12.17* 8.70* 15.92* 9.70*  CALCIUM 9.4  --  9.0 8.6* 8.3* 8.7*  PHOS  --   --   --   --  10.0* 7.5*    Liver Function Tests:  Recent Labs Lab 07/29/16 1649 07/30/16 1109  ALBUMIN 2.5* 2.4*   No results for input(s): LIPASE, AMYLASE in the last 168 hours. No results for input(s): AMMONIA in the last 168 hours.  CBC:  Recent Labs Lab 07/25/16 1306  07/26/16 0645 07/27/16 0139 07/29/16 1649 07/30/16 0451 07/30/16 1030  WBC 12.5*  --  15.0* 16.5* 6.2 5.6 5.8  NEUTROABS 6.0  --   --   --   --   --   --   HGB 12.8*  < > 12.3* 11.2* 10.2* 10.5* 10.4*  HCT 38.1*  < > 36.6* 33.8* 30.5* 31.2* 31.2*  MCV 93.1  --  91.9 93.0 92.8 92.9 93.4  PLT 119*  --  117* 111* 114* 120* 126*  < > = values in this interval not displayed.  Cardiac Enzymes:  Recent Labs Lab 07/26/16 0645 07/26/16 0904 07/26/16 1710 07/26/16 2046  TROPONINI 2.06* 2.30* 1.82* 1.74*    BNP: Invalid input(s): POCBNP  CBG:  Recent Labs Lab 07/28/16 2115 07/29/16 0734 07/29/16 1141 07/29/16 2059 07/30/16 0743  GLUCAP 98 82 128* 125* 102*  Microbiology: Results for orders placed or performed during the hospital encounter of 07/26/16  Blood Culture (routine x 2)     Status: Abnormal   Collection Time: 07/26/16  8:08 AM  Result Value Ref Range Status   Specimen Description BLOOD LEFT ANTECUBITAL  Final   Special Requests BOTTLES DRAWN AEROBIC AND ANAEROBIC BCAV  Final   Culture  Setup Time   Final    GRAM POSITIVE COCCI ANAEROBIC BOTTLE ONLY CRITICAL VALUE NOTED.  VALUE IS CONSISTENT WITH PREVIOUSLY REPORTED AND CALLED VALUE.    Culture (A)  Final    STAPHYLOCOCCUS AUREUS SUSCEPTIBILITIES PERFORMED ON PREVIOUS CULTURE WITHIN THE LAST 5 DAYS. Performed at [facility], [place] [address] [place], [place] [address]    Report Status 07/29/2016 FINAL  Final  Blood  Culture (routine x 2)     Status: Abnormal   Collection Time: 07/26/16  9:57 AM  Result Value Ref Range Status   Specimen Description BLOOD LEFT ARM  Final   Special Requests BOTTLES DRAWN AEROBIC AND ANAEROBIC BCHV  Final   Culture  Setup Time   Final    GRAM POSITIVE COCCI AEROBIC BOTTLE ONLY CRITICAL RESULT CALLED TO, READ BACK BY AND VERIFIED WITH: [name] COOKSON AT 2325 07/26/16.PMH Performed at [facility], [place] [address] [place], [place] [address]    Culture METHICILLIN RESISTANT STAPHYLOCOCCUS AUREUS (A)  Final   Report Status 07/29/2016 FINAL  Final   Organism ID, Bacteria METHICILLIN RESISTANT STAPHYLOCOCCUS AUREUS  Final      Susceptibility   Methicillin resistant staphylococcus aureus - MIC*    CIPROFLOXACIN >=8 RESISTANT Resistant     ERYTHROMYCIN >=8 RESISTANT Resistant     GENTAMICIN <=0.5 SENSITIVE Sensitive     OXACILLIN >=4 RESISTANT Resistant     TETRACYCLINE <=1 SENSITIVE Sensitive     VANCOMYCIN 1 SENSITIVE Sensitive     TRIMETH/SULFA <=10 SENSITIVE Sensitive     CLINDAMYCIN RESISTANT Resistant     RIFAMPIN <=0.5 SENSITIVE Sensitive     Inducible Clindamycin POSITIVE Resistant     * METHICILLIN RESISTANT STAPHYLOCOCCUS AUREUS  Blood Culture ID Panel (Reflexed)     Status: Abnormal   Collection Time: 07/26/16  9:57 AM  Result Value Ref Range Status   Enterococcus species NOT DETECTED NOT DETECTED Final   Listeria monocytogenes NOT DETECTED NOT DETECTED Final   Staphylococcus species DETECTED (A) NOT DETECTED Final    Comment: CRITICAL RESULT CALLED TO, READ BACK BY AND VERIFIED WITH: [name] COOKSON AT 2325 07/26/16.PMH    Staphylococcus aureus DETECTED (A) NOT DETECTED Final    Comment: Methicillin (oxacillin)-resistant Staphylococcus aureus (MRSA). MRSA is predictably resistant to beta-lactam antibiotics (except ceftaroline). Preferred therapy is vancomycin unless clinically contraindicated. Patient requires contact precautions if  hospitalized. CRITICAL  RESULT CALLED TO, READ BACK BY AND VERIFIED WITH: [name] COOKSON AT 2325 07/26/16.PMH    Methicillin resistance DETECTED (A) NOT DETECTED Final    Comment: CRITICAL RESULT CALLED TO, READ BACK BY AND VERIFIED WITH: [name] COOKSON AT 2325 07/26/16.PMH    Streptococcus species NOT DETECTED NOT DETECTED Final   Streptococcus agalactiae NOT DETECTED NOT DETECTED Final   Streptococcus pneumoniae NOT DETECTED NOT DETECTED Final   Streptococcus pyogenes NOT DETECTED NOT DETECTED Final   Acinetobacter baumannii NOT DETECTED NOT DETECTED Final   Enterobacteriaceae species NOT DETECTED NOT DETECTED Final   Enterobacter cloacae complex NOT DETECTED NOT DETECTED Final   Escherichia coli NOT DETECTED NOT DETECTED Final   Klebsiella oxytoca NOT DETECTED NOT  DETECTED Final   Klebsiella pneumoniae NOT DETECTED NOT DETECTED Final   Proteus species NOT DETECTED NOT DETECTED Final   Serratia marcescens NOT DETECTED NOT DETECTED Final   Haemophilus influenzae NOT DETECTED NOT DETECTED Final   Neisseria meningitidis NOT DETECTED NOT DETECTED Final   Pseudomonas aeruginosa NOT DETECTED NOT DETECTED Final   Candida albicans NOT DETECTED NOT DETECTED Final   Candida glabrata NOT DETECTED NOT DETECTED Final   Candida krusei NOT DETECTED NOT DETECTED Final   Candida parapsilosis NOT DETECTED NOT DETECTED Final   Candida tropicalis NOT DETECTED NOT DETECTED Final  MRSA PCR Screening     Status: None   Collection Time: 07/27/16  1:10 AM  Result Value Ref Range Status   MRSA by PCR NEGATIVE NEGATIVE Final    Comment:        The GeneXpert MRSA Assay (FDA approved for NASAL specimens only), is one component of a comprehensive MRSA colonization surveillance program. It is not intended to diagnose MRSA infection nor to guide or monitor treatment for MRSA infections.   Cath Tip Culture     Status: Abnormal   Collection Time: 07/27/16 12:07 PM  Result Value Ref Range Status   Specimen Description CATH TIP  Final    Special Requests NONE  Final   Culture (A)  Final    >=100,000 COLONIES/mL METHICILLIN RESISTANT STAPHYLOCOCCUS AUREUS   Report Status 07/30/2016 FINAL  Final   Organism ID, Bacteria METHICILLIN RESISTANT STAPHYLOCOCCUS AUREUS (A)  Final      Susceptibility   Methicillin resistant staphylococcus aureus - MIC*    CIPROFLOXACIN >=8 RESISTANT Resistant     ERYTHROMYCIN >=8 RESISTANT Resistant     GENTAMICIN <=0.5 SENSITIVE Sensitive     OXACILLIN >=4 RESISTANT Resistant     TETRACYCLINE <=1 SENSITIVE Sensitive     VANCOMYCIN 1 SENSITIVE Sensitive     TRIMETH/SULFA <=10 SENSITIVE Sensitive     CLINDAMYCIN RESISTANT Resistant     RIFAMPIN <=0.5 SENSITIVE Sensitive     Inducible Clindamycin POSITIVE Resistant     * >=100,000 COLONIES/mL METHICILLIN RESISTANT STAPHYLOCOCCUS AUREUS  CULTURE, BLOOD (ROUTINE X 2) w Reflex to ID Panel     Status: None (Preliminary result)   Collection Time: 07/28/16  1:59 PM  Result Value Ref Range Status   Specimen Description BLOOD  LEFT HAND  Final   Special Requests BOTTLES DRAWN AEROBIC AND ANAEROBIC  BCAV  Final   Culture NO GROWTH 2 DAYS  Final   Report Status PENDING  Incomplete  CULTURE, BLOOD (ROUTINE X 2) w Reflex to ID Panel     Status: None (Preliminary result)   Collection Time: 07/28/16  2:10 PM  Result Value Ref Range Status   Specimen Description BLOOD  LEFT WRIST  Final   Special Requests BOTTLES DRAWN AEROBIC AND ANAEROBIC  BCAV  Final   Culture NO GROWTH 2 DAYS  Final   Report Status PENDING  Incomplete    Coagulation Studies: No results for input(s): LABPROT, INR in the last 72 hours.  Urinalysis: No results for input(s): COLORURINE, LABSPEC, PHURINE, GLUCOSEU, HGBUR, BILIRUBINUR, KETONESUR, PROTEINUR, UROBILINOGEN, NITRITE, LEUKOCYTESUR in the last 72 hours.  Invalid input(s): APPERANCEUR    Imaging: No results found.   Medications:    . aspirin  325 mg Oral BH-q7a  . calcium acetate  667 mg Oral TID WC  .  Chlorhexidine Gluconate Cloth  6 each Topical Q0600  . fluticasone  1 spray Each Nare Daily  . heparin subcutaneous  5,000 Units Subcutaneous Q8H  . insulin aspart  0-9 Units Subcutaneous TID WC  . midodrine  5 mg Oral Q M,W,F  . mometasone-formoterol  2 puff Inhalation BID  . multivitamin  1 tablet Oral QHS  . multivitamin with minerals  1 tablet Oral Daily  . mupirocin ointment  1 application Nasal BID  . pantoprazole  40 mg Oral Daily  . polyethylene glycol  17 g Oral BH-q7a  . sevelamer carbonate  800 mg Oral Q breakfast  . simvastatin  20 mg Oral Daily  . sodium chloride flush  3 mL Intravenous Q12H  . tiotropium  18 mcg Inhalation Daily  . [START ON 07/31/2016] vancomycin  750 mg Intravenous Q M,W,F-HD     Assessment/ Plan:  [name] is a 61 y.o. black male Mr. JUANPABLO CIRESI is a 61 y.o. black male with ESRD on hemodialysis, hypertension, hyperlipidemia, anemia, COPD, peripheral vascular disease, left BKA, right toe amputations  [facility].   1.  End stage renal disease with hyperkalemia on admission. Emergent hemodialysis on 3/2. Complication of dialysis device. Tunnel infection. Catheter removed 3/3,   - vancomycin for MRSA in blood - Monitor daily for dialysis need.  -Temp cath in place - Extra HD dialysis today due to high Phos, high K; re-evaluate daily - discussed with ID- blood cultures are negative - VANC for 4 weeks-  - permcath Wed or Thursday - will notify vascular surgery  2.  Anemia of chronic kidney disease: hemoglobin 10.4 - consider EPO once Hgb < 10  3. Hypertension: blood pressure at goal.  - midodrine before dialysis.   4. Secondary Hyperparathyroidism: - calcium acetate with meals.   5. Sepsis: blood cultures positive with MRSA. Catheter tip culture MRSA  blood cultures on 3/4 are negative so far     LOS: 4 Elverta Dimiceli 3/6/20183:36 PM

## 2016-07-30 NOTE — Progress Notes (Signed)
Pre hd info 

## 2016-07-30 NOTE — Progress Notes (Signed)
PT Cancellation Note  Patient Details Name: [name] MRN: [id number] DOB: 1956-01-10   Cancelled Treatment:    Reason Eval/Treat Not Completed: Patient declined, no reason specified   Pt encouraged and offered session this am.  He declined session.  No specific reason stated.   Chesley Noon 07/30/2016, 9:04 AM

## 2016-07-30 NOTE — Progress Notes (Signed)
Patient ID: [name], male   DOB: 28-Jul-1955, 61 y.o.   MRN: [id number]   [facility] PROGRESS NOTE  EULOGIO REQUENA AST:[id number] DOB: 01/23/1956 DOA: 07/26/2016 PCP: Brandon  HPI/Subjective: Patient feeling okay. Appetite will poor. Seen down on dialysis and he says his positioning in the bed was not comfortable. The patient has a dialysis catheter in his right groin.  Objective: Vitals:   07/30/16 1417 07/30/16 1420  BP: 101/70 119/74  Pulse: 72 67  Resp: 10 15  Temp: 97.7 F (36.5 C)     Filed Weights   07/30/16 0500 07/30/16 1015 07/30/16 1417  Weight: 81.1 kg (178 lb 12.8 oz) 81.9 kg (180 lb 8.9 oz) 80.8 kg (178 lb 2.1 oz)    ROS: Review of Systems  Constitutional: Negative for chills and fever.  Eyes: Negative for blurred vision.  Respiratory: Negative for cough and shortness of breath.   Cardiovascular: Negative for chest pain.  Gastrointestinal: Negative for abdominal pain, constipation, diarrhea, nausea and vomiting.  Genitourinary: Negative for dysuria.  Musculoskeletal: Negative for joint pain and neck pain.  Neurological: Negative for dizziness and headaches.   Exam: Physical Exam  Constitutional: He is oriented to person, place, and time.  HENT:  Nose: No mucosal edema.  Mouth/Throat: No oropharyngeal exudate or posterior oropharyngeal edema.  Eyes: Conjunctivae, EOM and lids are normal. Pupils are equal, round, and reactive to light.  Neck: No JVD present. Carotid bruit is not present. No edema present. No thyroid mass and no thyromegaly present.  Cardiovascular: S1 normal and S2 normal.  Exam reveals no gallop.   Murmur heard.  Systolic murmur is present with a grade of 2/6  Pulses:      Dorsalis pedis pulses are 2+ on the right side, and 2+ on the left side.  Respiratory: No respiratory distress. He has no wheezes. He has no rhonchi. He has no rales.  GI: Soft. Bowel sounds are normal. There is no tenderness.   Musculoskeletal:       Right ankle: He exhibits no swelling.  Lymphadenopathy:    He has no cervical adenopathy.  Neurological: He is alert and oriented to person, place, and time. No cranial nerve deficit.  Skin: Skin is warm. No rash noted. Nails show no clubbing.  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      Data Reviewed: Basic Metabolic Panel:  Recent Labs Lab 07/25/16 1306 07/25/16 1359 07/26/16 0645 07/27/16 0139 07/29/16 1649 07/30/16 1109  NA 137 137 135 136 137 137  K 5.6* 5.8* 7.0* 5.1 6.0* 4.9  CL 100*  --  103 96* 96* 97*  CO2 23  --  19* 28 24 29   GLUCOSE 95 91 94 125* 93 99  BUN 70*  --  91* 58* 120* 60*  CREATININE 10.31*  --  12.17* 8.70* 15.92* 9.70*  CALCIUM 9.4  --  9.0 8.6* 8.3* 8.7*  PHOS  --   --   --   --  10.0* 7.5*   CBC:  Recent Labs Lab 07/25/16 1306  07/26/16 0645 07/27/16 0139 07/29/16 1649 07/30/16 0451 07/30/16 1030  WBC 12.5*  --  15.0* 16.5* 6.2 5.6 5.8  NEUTROABS 6.0  --   --   --   --   --   --   HGB 12.8*  < > 12.3* 11.2* 10.2* 10.5* 10.4*  HCT 38.1*  < > 36.6* 33.8* 30.5* 31.2*  31.2*  MCV 93.1  --  91.9 93.0 92.8 92.9 93.4  PLT 119*  --  117* 111* 114* 120* 126*  < > = values in this interval not displayed. Cardiac Enzymes:  Recent Labs Lab 07/26/16 0645 07/26/16 0904 07/26/16 1710 07/26/16 2046  TROPONINI 2.06* 2.30* 1.82* 1.74*   BNP (last 3 results)  Recent Labs  05/21/16 1954  BNP 3,452.0*     CBG:  Recent Labs Lab 07/28/16 2115 07/29/16 0734 07/29/16 1141 07/29/16 2059 07/30/16 0743  GLUCAP 98 82 128* 125* 102*    Recent Results (from the past 240 hour(s))  Surgical pcr screen     Status: None   Collection Time: 07/25/16  1:03 PM  Result Value Ref Range Status   MRSA, PCR NEGATIVE NEGATIVE Final   Staphylococcus aureus NEGATIVE NEGATIVE Final    Comment:        The Xpert SA Assay (FDA approved for NASAL specimens in patients over 71  years of age), is one component of a comprehensive surveillance program.  Test performance has been validated by [facility] for patients greater than or equal to 4 year old. It is not intended to diagnose infection nor to guide or monitor treatment.   Blood Culture (routine x 2)     Status: Abnormal   Collection Time: 07/26/16  8:08 AM  Result Value Ref Range Status   Specimen Description BLOOD LEFT ANTECUBITAL  Final   Special Requests BOTTLES DRAWN AEROBIC AND ANAEROBIC BCAV  Final   Culture  Setup Time   Final    GRAM POSITIVE COCCI ANAEROBIC BOTTLE ONLY CRITICAL VALUE NOTED.  VALUE IS CONSISTENT WITH PREVIOUSLY REPORTED AND CALLED VALUE.    Culture (A)  Final    STAPHYLOCOCCUS AUREUS SUSCEPTIBILITIES PERFORMED ON PREVIOUS CULTURE WITHIN THE LAST 5 DAYS. Performed at [facility], [place] [name] [place], [place] [address]    Report Status 07/29/2016 FINAL  Final  Blood Culture (routine x 2)     Status: Abnormal   Collection Time: 07/26/16  9:57 AM  Result Value Ref Range Status   Specimen Description BLOOD LEFT ARM  Final   Special Requests BOTTLES DRAWN AEROBIC AND ANAEROBIC BCHV  Final   Culture  Setup Time   Final    GRAM POSITIVE COCCI AEROBIC BOTTLE ONLY CRITICAL RESULT CALLED TO, READ BACK BY AND VERIFIED WITH: [name] COOKSON AT 2325 07/26/16.PMH Performed at [facility], [place] [address], [place], [place] [address]    Culture METHICILLIN RESISTANT STAPHYLOCOCCUS AUREUS (A)  Final   Report Status 07/29/2016 FINAL  Final   Organism ID, Bacteria METHICILLIN RESISTANT STAPHYLOCOCCUS AUREUS  Final      Susceptibility   Methicillin resistant staphylococcus aureus - MIC*    CIPROFLOXACIN >=8 RESISTANT Resistant     ERYTHROMYCIN >=8 RESISTANT Resistant     GENTAMICIN <=0.5 SENSITIVE Sensitive     OXACILLIN >=4 RESISTANT Resistant     TETRACYCLINE <=1 SENSITIVE Sensitive     VANCOMYCIN 1 SENSITIVE Sensitive     TRIMETH/SULFA <=10 SENSITIVE Sensitive      CLINDAMYCIN RESISTANT Resistant     RIFAMPIN <=0.5 SENSITIVE Sensitive     Inducible Clindamycin POSITIVE Resistant     * METHICILLIN RESISTANT STAPHYLOCOCCUS AUREUS  Blood Culture ID Panel (Reflexed)     Status: Abnormal   Collection Time: 07/26/16  9:57 AM  Result Value Ref Range Status   Enterococcus species NOT DETECTED NOT DETECTED Final   Listeria monocytogenes NOT DETECTED NOT  DETECTED Final   Staphylococcus species DETECTED (A) NOT DETECTED Final    Comment: CRITICAL RESULT CALLED TO, READ BACK BY AND VERIFIED WITH: [name] COOKSON AT 2325 07/26/16.PMH    Staphylococcus aureus DETECTED (A) NOT DETECTED Final    Comment: Methicillin (oxacillin)-resistant Staphylococcus aureus (MRSA). MRSA is predictably resistant to beta-lactam antibiotics (except ceftaroline). Preferred therapy is vancomycin unless clinically contraindicated. Patient requires contact precautions if  hospitalized. CRITICAL RESULT CALLED TO, READ BACK BY AND VERIFIED WITH: [name] COOKSON AT 2325 07/26/16.PMH    Methicillin resistance DETECTED (A) NOT DETECTED Final    Comment: CRITICAL RESULT CALLED TO, READ BACK BY AND VERIFIED WITH: [name] COOKSON AT 2325 07/26/16.PMH    Streptococcus species NOT DETECTED NOT DETECTED Final   Streptococcus agalactiae NOT DETECTED NOT DETECTED Final   Streptococcus pneumoniae NOT DETECTED NOT DETECTED Final   Streptococcus pyogenes NOT DETECTED NOT DETECTED Final   Acinetobacter baumannii NOT DETECTED NOT DETECTED Final   Enterobacteriaceae species NOT DETECTED NOT DETECTED Final   Enterobacter cloacae complex NOT DETECTED NOT DETECTED Final   Escherichia coli NOT DETECTED NOT DETECTED Final   Klebsiella oxytoca NOT DETECTED NOT DETECTED Final   Klebsiella pneumoniae NOT DETECTED NOT DETECTED Final   Proteus species NOT DETECTED NOT DETECTED Final   Serratia marcescens NOT DETECTED NOT DETECTED Final   Haemophilus influenzae NOT DETECTED NOT DETECTED Final   Neisseria meningitidis NOT  DETECTED NOT DETECTED Final   Pseudomonas aeruginosa NOT DETECTED NOT DETECTED Final   Candida albicans NOT DETECTED NOT DETECTED Final   Candida glabrata NOT DETECTED NOT DETECTED Final   Candida krusei NOT DETECTED NOT DETECTED Final   Candida parapsilosis NOT DETECTED NOT DETECTED Final   Candida tropicalis NOT DETECTED NOT DETECTED Final  MRSA PCR Screening     Status: None   Collection Time: 07/27/16  1:10 AM  Result Value Ref Range Status   MRSA by PCR NEGATIVE NEGATIVE Final    Comment:        The GeneXpert MRSA Assay (FDA approved for NASAL specimens only), is one component of a comprehensive MRSA colonization surveillance program. It is not intended to diagnose MRSA infection nor to guide or monitor treatment for MRSA infections.   Cath Tip Culture     Status: Abnormal (Preliminary result)   Collection Time: 07/27/16 12:07 PM  Result Value Ref Range Status   Specimen Description CATH TIP  Final   Special Requests NONE  Final   Culture (A)  Final    >=100,000 COLONIES/mL METHICILLIN RESISTANT STAPHYLOCOCCUS AUREUS   Report Status PENDING  Incomplete   Organism ID, Bacteria METHICILLIN RESISTANT STAPHYLOCOCCUS AUREUS (A)  Final      Susceptibility   Methicillin resistant staphylococcus aureus - MIC*    CIPROFLOXACIN >=8 RESISTANT Resistant     ERYTHROMYCIN >=8 RESISTANT Resistant     GENTAMICIN <=0.5 SENSITIVE Sensitive     OXACILLIN >=4 RESISTANT Resistant     TETRACYCLINE <=1 SENSITIVE Sensitive     VANCOMYCIN 1 SENSITIVE Sensitive     TRIMETH/SULFA <=10 SENSITIVE Sensitive     CLINDAMYCIN RESISTANT Resistant     RIFAMPIN <=0.5 SENSITIVE Sensitive     Inducible Clindamycin POSITIVE Resistant     * >=100,000 COLONIES/mL METHICILLIN RESISTANT STAPHYLOCOCCUS AUREUS  CULTURE, BLOOD (ROUTINE X 2) w Reflex to ID Panel     Status: None (Preliminary result)   Collection Time: 07/28/16  1:59 PM  Result Value Ref Range Status   Specimen Description BLOOD  LEFT HAND  Final   Special Requests BOTTLES DRAWN AEROBIC AND ANAEROBIC  BCAV  Final   Culture NO GROWTH 2 DAYS  Final   Report Status PENDING  Incomplete  CULTURE, BLOOD (ROUTINE X 2) w Reflex to ID Panel     Status: None (Preliminary result)   Collection Time: 07/28/16  2:10 PM  Result Value Ref Range Status   Specimen Description BLOOD  LEFT WRIST  Final   Special Requests BOTTLES DRAWN AEROBIC AND ANAEROBIC  BCAV  Final   Culture NO GROWTH 2 DAYS  Final   Report Status PENDING  Incomplete      Scheduled Meds: . aspirin  325 mg Oral BH-q7a  . calcium acetate  667 mg Oral TID WC  . Chlorhexidine Gluconate Cloth  6 each Topical Q0600  . fluticasone  1 spray Each Nare Daily  . heparin subcutaneous  5,000 Units Subcutaneous Q8H  . insulin aspart  0-9 Units Subcutaneous TID WC  . midodrine  5 mg Oral Q M,W,F  . mometasone-formoterol  2 puff Inhalation BID  . multivitamin  1 tablet Oral QHS  . multivitamin with minerals  1 tablet Oral Daily  . mupirocin ointment  1 application Nasal BID  . pantoprazole  40 mg Oral Daily  . polyethylene glycol  17 g Oral BH-q7a  . sevelamer carbonate  800 mg Oral Q breakfast  . simvastatin  20 mg Oral Daily  . sodium chloride flush  3 mL Intravenous Q12H  . tiotropium  18 mcg Inhalation Daily  . [START ON 07/31/2016] vancomycin  750 mg Intravenous Q M,W,F-HD    Assessment/Plan:  1. MRSA sepsis. Source Is dialysis catheter. Dialysis catheter removed Saturday by vascular surgery.  Continue vancomycin dosed by pharmacy.  Repeat blood cultures so far are negative. ID commence 4 weeks of IV antibiotics with dialysis.  Temporary catheter placed yesterday for dialysis. Okay to place permanent catheter tomorrow if blood cultures remain negative. 2. Acute encephalopathy. Resolved 3. Acute respiratory failure with hypoxia. Continue oxygen supplementation. Pulse ox good on oxygen. Check a room air saturation. 4. Severe hyperkalemia improved with dialysis.   5. Hyperlipidemia unspecified on simvastatin 6. GERD on Protonix 7. Relative hypotension continue to hold antihypertensive medications 8. End-stage renal disease.  Temporary catheter placed today for dialysis. Will need permanent catheter prior to disposition. 9. Cardiomyopathy seen on echocardiogram. No change from previous  Code Status:     Code Status Orders        Start     Ordered   07/26/16 1708  Do not attempt resuscitation (DNR)  Continuous    Question Answer Comment  In the event of cardiac or respiratory ARREST Do not call a "code blue"   In the event of cardiac or respiratory ARREST Do not perform Intubation, CPR, defibrillation or ACLS   In the event of cardiac or respiratory ARREST Use medication by any route, position, wound care, and other measures to relive pain and suffering. May use oxygen, suction and manual treatment of airway obstruction as needed for comfort.   Comments confirmed with pt's sister in room at admission.      07/26/16 1707    Code Status History    Date Active Date Inactive Code Status Order ID Comments User Context   05/22/2016 12:34 AM 05/24/2016  7:11 PM Full Code [id number]  Harvie Bridge, DO Inpatient   01/13/2015  1:44 AM 01/14/2015  2:46 PM Full Code [id number]  Juluis Mire, MD ED   12/15/2014  9:32 AM 12/15/2014  2:31 PM Full Code [id number]  Algernon Huxley, MD Inpatient   05/06/2014  8:29 PM 05/10/2014  5:23 PM Full Code [id number]  Elam Dutch, MD Inpatient   05/04/2014 10:24 PM 05/06/2014  8:29 PM Full Code [id number]  Rise Patience, MD Inpatient     Disposition Plan: Potentially back to facility tomorrow afternoon versus Thursday morning depending on dialysis and catheter placement tomorrow  Consultants:  Nephrology  Vascular surgery  Infectious disease  Antibiotics:  Vancomycin  Time spent: 25 minutes. Case discussed with sister on the phone  [place], [facility]

## 2016-07-31 ENCOUNTER — Encounter
Admission: EM | Disposition: A | Discharge status: Skilled Nursing Facility | Payer: Self-pay | Source: Home / Self Care | Attending: Internal Medicine

## 2016-07-31 ENCOUNTER — Encounter: Payer: Self-pay | Admitting: *Deleted

## 2016-07-31 DIAGNOSIS — T827XXA Infection and inflammatory reaction due to other cardiac and vascular devices, implants and grafts, initial encounter: Secondary | ICD-10-CM

## 2016-07-31 DIAGNOSIS — I82C12 Acute embolism and thrombosis of left internal jugular vein: Secondary | ICD-10-CM

## 2016-07-31 HISTORY — PX: DIALYSIS/PERMA CATHETER INSERTION: CATH118288

## 2016-07-31 LAB — VANCOMYCIN, RANDOM: Vancomycin Rm: 17

## 2016-07-31 LAB — GLUCOSE, CAPILLARY
Glucose-Capillary: 99 mg/dL (ref 65–99)
Glucose-Capillary: 84 mg/dL (ref 65–99)

## 2016-07-31 SURGERY — DIALYSIS/PERMA CATHETER INSERTION
Anesthesia: Moderate Sedation

## 2016-07-31 MED ORDER — FENTANYL CITRATE (PF) 100 MCG/2ML IJ SOLN: 25.0000 ug | INTRAMUSCULAR | Status: DC | PRN

## 2016-07-31 MED ORDER — FENTANYL CITRATE (PF) 100 MCG/2ML IJ SOLN
INTRAMUSCULAR | Status: DC | PRN
  Administered 2016-07-31: 50 ug via INTRAVENOUS
  Administered 2016-07-31: 25 ug via INTRAVENOUS

## 2016-07-31 MED ORDER — CEFAZOLIN IN D5W 1 GM/50ML IV SOLN
1.0000 g | Freq: Once | INTRAVENOUS | Status: AC
  Administered 2016-07-31: 1 g via INTRAVENOUS

## 2016-07-31 MED ORDER — SODIUM CHLORIDE 0.9 % IV SOLN: INTRAVENOUS | Status: DC

## 2016-07-31 MED ORDER — OXYCODONE HCL 5 MG PO TABS: 5.0000 mg | ORAL_TABLET | Freq: Once | ORAL | Status: DC | PRN

## 2016-07-31 MED ORDER — HEPARIN SODIUM (PORCINE) 10000 UNIT/ML IJ SOLN
INTRAMUSCULAR | Status: AC
  Filled 2016-07-31: qty 1

## 2016-07-31 MED ORDER — HEPARIN (PORCINE) IN NACL 2-0.9 UNIT/ML-% IJ SOLN
INTRAMUSCULAR | Status: AC
  Filled 2016-07-31: qty 500

## 2016-07-31 MED ORDER — MIDAZOLAM HCL 2 MG/2ML IJ SOLN
INTRAMUSCULAR | Status: AC
  Filled 2016-07-31: qty 2

## 2016-07-31 MED ORDER — FENTANYL CITRATE (PF) 100 MCG/2ML IJ SOLN
INTRAMUSCULAR | Status: AC
  Filled 2016-07-31: qty 2

## 2016-07-31 MED ORDER — OXYCODONE-ACETAMINOPHEN 7.5-325 MG PO TABS
1.0000 | ORAL_TABLET | ORAL | 0 refills | Status: DC | PRN
Start: 2016-07-31 — End: 2016-10-31

## 2016-07-31 MED ORDER — VANCOMYCIN HCL IN DEXTROSE 750-5 MG/150ML-% IV SOLN: INTRAVENOUS | Status: DC

## 2016-07-31 MED ORDER — OXYCODONE HCL 5 MG/5ML PO SOLN: 5.0000 mg | Freq: Once | ORAL | Status: DC | PRN

## 2016-07-31 MED ORDER — LIDOCAINE HCL (PF) 1 % IJ SOLN
INTRAMUSCULAR | Status: AC
  Filled 2016-07-31: qty 30

## 2016-07-31 MED ORDER — MIDAZOLAM HCL 2 MG/2ML IJ SOLN
INTRAMUSCULAR | Status: DC | PRN
  Administered 2016-07-31: 2 mg via INTRAVENOUS
  Administered 2016-07-31: 1 mg via INTRAVENOUS

## 2016-07-31 SURGICAL SUPPLY — 12 items
BALLN LUTONIX AV 7X60X75 (BALLOONS) ×3
BALLOON LUTONIX AV 7X60X75 (BALLOONS) ×1 IMPLANT
CATH PALINDROME RT-P 15FX23CM (CATHETERS) IMPLANT
CATH PALINDROME RT-P 15FX28CM (CATHETERS) ×3 IMPLANT
CATH TORCON 5FR 0.38 (CATHETERS) ×3 IMPLANT
DERMABOND ADVANCED (GAUZE/BANDAGES/DRESSINGS) ×2
DERMABOND ADVANCED .7 DNX12 (GAUZE/BANDAGES/DRESSINGS) ×1 IMPLANT
DEVICE PRESTO INFLATION (MISCELLANEOUS) ×3 IMPLANT
GLIDEWIRE STIFF .35X180X3 HYDR (WIRE) ×3 IMPLANT
PACK ANGIOGRAPHY (CUSTOM PROCEDURE TRAY) ×3 IMPLANT
SHEATH BRITE TIP 6FRX11 (SHEATH) ×3 IMPLANT
SUT MNCRL AB 4-0 PS2 18 (SUTURE) ×3 IMPLANT

## 2016-07-31 NOTE — Progress Notes (Signed)
07/31/2016 5:13 PM  BP 129/84 (BP Location: Left Arm)   Pulse 68   Temp 97.7 F (36.5 C) (Oral)   Resp 16   Ht 6\' 2"  (1.88 m)   Wt 80.7 kg (178 lb)   SpO2 100%   BMI 22.85 kg/m . Patient discharged per MD orders. Discharge instructions reviewed with patient and patient verbalized understanding. Report called to Roderic Palau, Therapist, sports at  [facility].  IV and trialysis catheter removed per policy. Dressing dry and intact, patient laying flat per policy. EMS called for transport to facility. Awaiting EMS to arrive.  Almedia Balls, RN

## 2016-07-31 NOTE — Progress Notes (Signed)
Pharmacy Antibiotic Note  [name] is a 61 y.o. male admitted on 07/26/2016 with  sepsis.  Pharmacy has been consulted for  Vancomcyin dosing. Patient has ESRD and W/HD on MWF.   HD Permcath removed 3/3  Plan: HD Permcath removed 3/3 due to bacteremia. Nephrology to evaluate for HD on a daily basis.  Pt has received a total of 1500mg  of vancomycin as loading dose and 1000mg  with HD on 3/2. Based on weight, will order vancomycin 750mg  IV QHD. Need to follow closely for HD schedule. Check trough prior to 3rd HD session. Target pre-HD goal: 15-55mcg/ml  3/7: Vancomycin Random level resulted @ 17. Will continue Vancomycin 750 mg IV qHD   Height: 6\' 6"  (198.1 cm) Weight: 178 lb 2.1 oz (80.8 kg) IBW/kg (Calculated) : 91.4  Temp (24hrs), Avg:98.3 F (36.8 C), Min:97.7 F (36.5 C), Max:98.5 F (36.9 C)   Recent Labs Lab 07/25/16 1306 07/26/16 0645 07/26/16 0805 07/27/16 0139 07/29/16 1649 07/30/16 0451 07/30/16 1030 07/30/16 1109 07/31/16 0523  WBC 12.5* 15.0*  --  16.5* 6.2 5.6 5.8  --   --   CREATININE 10.31* 12.17*  --  8.70* 15.92*  --   --  9.70*  --   LATICACIDVEN  --   --  1.5  --   --   --   --   --   --   VANCORANDOM  --   --   --   --   --   --   --   --  17    Estimated Creatinine Clearance: 9 mL/min (by C-G formula based on SCr of 9.7 mg/dL (H)).    Allergies  Allergen Reactions  . Dust Mite Extract     Antimicrobials this admission: 3/2 Zosyn >> 3/3 3/2 Vancomycin >>   Dose adjustments this admission:   Microbiology results: 3/3 Cath tip: pend 3/2 BCx: GPC x 2 sets, BCID Staph Aureus, MecA + 3/2 UCx: sent 3/2 MRSA PCR: sent   Thank you for allowing pharmacy to be a part of this patient's care.  Larene Beach, PharmD, BCPS Clinical Pharmacist 07/31/2016 12:25 PM

## 2016-07-31 NOTE — Care Management Important Message (Signed)
Important Message  Patient Details  Name: [name] MRN: [id number] Date of Birth: April 22, 1956   Medicare Important Message Given:  Yes    Beverly Sessions, RN 07/31/2016, 11:49 AM

## 2016-07-31 NOTE — Progress Notes (Addendum)
[facility]  ROUNDING NOTE   Subjective:   Patient is doing fair N.p.o. This morning for permcath placement   Objective:  Vital signs in last 24 hours:  Temp:  [97.7 F (36.5 C)-98.5 F (36.9 C)] 98.4 F (36.9 C) (03/07 1205) Pulse Rate:  [67-79] 70 (03/07 1205) Resp:  [10-16] 16 (03/07 1205) BP: (99-119)/(68-79) 104/68 (03/07 1205) SpO2:  [100 %] 100 % (03/07 1205) Weight:  [80.8 kg (178 lb 2.1 oz)] 80.8 kg (178 lb 2.1 oz) (03/06 1417)  Weight change: -1 kg (-2 lb 3.3 oz) Filed Weights   07/30/16 0500 07/30/16 1015 07/30/16 1417  Weight: 81.1 kg (178 lb 12.8 oz) 81.9 kg (180 lb 8.9 oz) 80.8 kg (178 lb 2.1 oz)    Intake/Output: I/O last 3 completed shifts: In: 37 [P.O.:480; I.V.:6] Out: 2100 [Other:2100]   Intake/Output this shift:  No intake/output data recorded.  Physical Exam: General: Laying in bed  Head: Normocephalic, atraumatic. Moist oral mucosal membranes  Eyes: Anicteric,   Neck: Supple, trachea midline  Lungs:  Clear to auscultation  Heart: Regular rate and rhythm  Abdomen:  Soft, nontender  Extremities: Left BKA, right toe amputations  Neurologic: Nonfocal, moving all four extremities  Skin: No lesions  Access: Femoral temp dialysis cathter    Basic Metabolic Panel:  Recent Labs Lab 07/25/16 1306 07/25/16 1359 07/26/16 0645 07/27/16 0139 07/29/16 1649 07/30/16 1109  NA 137 137 135 136 137 137  K 5.6* 5.8* 7.0* 5.1 6.0* 4.9  CL 100*  --  103 96* 96* 97*  CO2 23  --  19* 28 24 29   GLUCOSE 95 91 94 125* 93 99  BUN 70*  --  91* 58* 120* 60*  CREATININE 10.31*  --  12.17* 8.70* 15.92* 9.70*  CALCIUM 9.4  --  9.0 8.6* 8.3* 8.7*  PHOS  --   --   --   --  10.0* 7.5*    Liver Function Tests:  Recent Labs Lab 07/29/16 1649 07/30/16 1109  ALBUMIN 2.5* 2.4*   No results for input(s): LIPASE, AMYLASE in the last 168 hours. No results for input(s): AMMONIA in the last 168 hours.  CBC:  Recent Labs Lab 07/25/16 1306   07/26/16 0645 07/27/16 0139 07/29/16 1649 07/30/16 0451 07/30/16 1030  WBC 12.5*  --  15.0* 16.5* 6.2 5.6 5.8  NEUTROABS 6.0  --   --   --   --   --   --   HGB 12.8*  < > 12.3* 11.2* 10.2* 10.5* 10.4*  HCT 38.1*  < > 36.6* 33.8* 30.5* 31.2* 31.2*  MCV 93.1  --  91.9 93.0 92.8 92.9 93.4  PLT 119*  --  117* 111* 114* 120* 126*  < > = values in this interval not displayed.  Cardiac Enzymes:  Recent Labs Lab 07/26/16 0645 07/26/16 0904 07/26/16 1710 07/26/16 2046  TROPONINI 2.06* 2.30* 1.82* 1.74*    BNP: Invalid input(s): POCBNP  CBG:  Recent Labs Lab 07/30/16 0743 07/30/16 1633 07/30/16 2135 07/31/16 0733 07/31/16 1121  GLUCAP 102* 113* 107* 99 44    Microbiology: Results for orders placed or performed during the hospital encounter of 07/26/16  Blood Culture (routine x 2)     Status: Abnormal   Collection Time: 07/26/16  8:08 AM  Result Value Ref Range Status   Specimen Description BLOOD LEFT ANTECUBITAL  Final   Special Requests BOTTLES DRAWN AEROBIC AND ANAEROBIC BCAV  Final   Culture  Setup Time  Final    GRAM POSITIVE COCCI ANAEROBIC BOTTLE ONLY CRITICAL VALUE NOTED.  VALUE IS CONSISTENT WITH PREVIOUSLY REPORTED AND CALLED VALUE.    Culture (A)  Final    STAPHYLOCOCCUS AUREUS SUSCEPTIBILITIES PERFORMED ON PREVIOUS CULTURE WITHIN THE LAST 5 DAYS. Performed at [facility], [place] [address] [place], [place] [address]    Report Status 07/29/2016 FINAL  Final  Blood Culture (routine x 2)     Status: Abnormal   Collection Time: 07/26/16  9:57 AM  Result Value Ref Range Status   Specimen Description BLOOD LEFT ARM  Final   Special Requests BOTTLES DRAWN AEROBIC AND ANAEROBIC BCHV  Final   Culture  Setup Time   Final    GRAM POSITIVE COCCI AEROBIC BOTTLE ONLY CRITICAL RESULT CALLED TO, READ BACK BY AND VERIFIED WITH: [name] COOKSON AT 2325 07/26/16.PMH Performed at [facility], [place] [address] [place], [place] [address]    Culture METHICILLIN  RESISTANT STAPHYLOCOCCUS AUREUS (A)  Final   Report Status 07/29/2016 FINAL  Final   Organism ID, Bacteria METHICILLIN RESISTANT STAPHYLOCOCCUS AUREUS  Final      Susceptibility   Methicillin resistant staphylococcus aureus - MIC*    CIPROFLOXACIN >=8 RESISTANT Resistant     ERYTHROMYCIN >=8 RESISTANT Resistant     GENTAMICIN <=0.5 SENSITIVE Sensitive     OXACILLIN >=4 RESISTANT Resistant     TETRACYCLINE <=1 SENSITIVE Sensitive     VANCOMYCIN 1 SENSITIVE Sensitive     TRIMETH/SULFA <=10 SENSITIVE Sensitive     CLINDAMYCIN RESISTANT Resistant     RIFAMPIN <=0.5 SENSITIVE Sensitive     Inducible Clindamycin POSITIVE Resistant     * METHICILLIN RESISTANT STAPHYLOCOCCUS AUREUS  Blood Culture ID Panel (Reflexed)     Status: Abnormal   Collection Time: 07/26/16  9:57 AM  Result Value Ref Range Status   Enterococcus species NOT DETECTED NOT DETECTED Final   Listeria monocytogenes NOT DETECTED NOT DETECTED Final   Staphylococcus species DETECTED (A) NOT DETECTED Final    Comment: CRITICAL RESULT CALLED TO, READ BACK BY AND VERIFIED WITH: [name] COOKSON AT 2325 07/26/16.PMH    Staphylococcus aureus DETECTED (A) NOT DETECTED Final    Comment: Methicillin (oxacillin)-resistant Staphylococcus aureus (MRSA). MRSA is predictably resistant to beta-lactam antibiotics (except ceftaroline). Preferred therapy is vancomycin unless clinically contraindicated. Patient requires contact precautions if  hospitalized. CRITICAL RESULT CALLED TO, READ BACK BY AND VERIFIED WITH: [name] COOKSON AT 2325 07/26/16.PMH    Methicillin resistance DETECTED (A) NOT DETECTED Final    Comment: CRITICAL RESULT CALLED TO, READ BACK BY AND VERIFIED WITH: [name] COOKSON AT 2325 07/26/16.PMH    Streptococcus species NOT DETECTED NOT DETECTED Final   Streptococcus agalactiae NOT DETECTED NOT DETECTED Final   Streptococcus pneumoniae NOT DETECTED NOT DETECTED Final   Streptococcus pyogenes NOT DETECTED NOT DETECTED Final    Acinetobacter baumannii NOT DETECTED NOT DETECTED Final   Enterobacteriaceae species NOT DETECTED NOT DETECTED Final   Enterobacter cloacae complex NOT DETECTED NOT DETECTED Final   Escherichia coli NOT DETECTED NOT DETECTED Final   Klebsiella oxytoca NOT DETECTED NOT DETECTED Final   Klebsiella pneumoniae NOT DETECTED NOT DETECTED Final   Proteus species NOT DETECTED NOT DETECTED Final   Serratia marcescens NOT DETECTED NOT DETECTED Final   Haemophilus influenzae NOT DETECTED NOT DETECTED Final   Neisseria meningitidis NOT DETECTED NOT DETECTED Final   Pseudomonas aeruginosa NOT DETECTED NOT DETECTED Final   Candida albicans NOT DETECTED NOT DETECTED Final   Candida  glabrata NOT DETECTED NOT DETECTED Final   Candida krusei NOT DETECTED NOT DETECTED Final   Candida parapsilosis NOT DETECTED NOT DETECTED Final   Candida tropicalis NOT DETECTED NOT DETECTED Final  MRSA PCR Screening     Status: None   Collection Time: 07/27/16  1:10 AM  Result Value Ref Range Status   MRSA by PCR NEGATIVE NEGATIVE Final    Comment:        The GeneXpert MRSA Assay (FDA approved for NASAL specimens only), is one component of a comprehensive MRSA colonization surveillance program. It is not intended to diagnose MRSA infection nor to guide or monitor treatment for MRSA infections.   Cath Tip Culture     Status: Abnormal   Collection Time: 07/27/16 12:07 PM  Result Value Ref Range Status   Specimen Description CATH TIP  Final   Special Requests NONE  Final   Culture (A)  Final    >=100,000 COLONIES/mL METHICILLIN RESISTANT STAPHYLOCOCCUS AUREUS   Report Status 07/30/2016 FINAL  Final   Organism ID, Bacteria METHICILLIN RESISTANT STAPHYLOCOCCUS AUREUS (A)  Final      Susceptibility   Methicillin resistant staphylococcus aureus - MIC*    CIPROFLOXACIN >=8 RESISTANT Resistant     ERYTHROMYCIN >=8 RESISTANT Resistant     GENTAMICIN <=0.5 SENSITIVE Sensitive     OXACILLIN >=4 RESISTANT Resistant      TETRACYCLINE <=1 SENSITIVE Sensitive     VANCOMYCIN 1 SENSITIVE Sensitive     TRIMETH/SULFA <=10 SENSITIVE Sensitive     CLINDAMYCIN RESISTANT Resistant     RIFAMPIN <=0.5 SENSITIVE Sensitive     Inducible Clindamycin POSITIVE Resistant     * >=100,000 COLONIES/mL METHICILLIN RESISTANT STAPHYLOCOCCUS AUREUS  CULTURE, BLOOD (ROUTINE X 2) w Reflex to ID Panel     Status: None (Preliminary result)   Collection Time: 07/28/16  1:59 PM  Result Value Ref Range Status   Specimen Description BLOOD  LEFT HAND  Final   Special Requests BOTTLES DRAWN AEROBIC AND ANAEROBIC  BCAV  Final   Culture NO GROWTH 3 DAYS  Final   Report Status PENDING  Incomplete  CULTURE, BLOOD (ROUTINE X 2) w Reflex to ID Panel     Status: None (Preliminary result)   Collection Time: 07/28/16  2:10 PM  Result Value Ref Range Status   Specimen Description BLOOD  LEFT WRIST  Final   Special Requests BOTTLES DRAWN AEROBIC AND ANAEROBIC  BCAV  Final   Culture NO GROWTH 3 DAYS  Final   Report Status PENDING  Incomplete    Coagulation Studies: No results for input(s): LABPROT, INR in the last 72 hours.  Urinalysis: No results for input(s): COLORURINE, LABSPEC, PHURINE, GLUCOSEU, HGBUR, BILIRUBINUR, KETONESUR, PROTEINUR, UROBILINOGEN, NITRITE, LEUKOCYTESUR in the last 72 hours.  Invalid input(s): APPERANCEUR    Imaging: No results found.   Medications:   . sodium chloride     . aspirin  325 mg Oral BH-q7a  . calcium acetate  667 mg Oral TID WC  . Chlorhexidine Gluconate Cloth  6 each Topical Q0600  . fluticasone  1 spray Each Nare Daily  . heparin subcutaneous  5,000 Units Subcutaneous Q8H  . insulin aspart  0-9 Units Subcutaneous TID WC  . midodrine  5 mg Oral Q M,W,F  . mometasone-formoterol  2 puff Inhalation BID  . multivitamin  1 tablet Oral QHS  . multivitamin with minerals  1 tablet Oral Daily  . mupirocin ointment  1 application Nasal BID  . pantoprazole  40  mg Oral Daily  . polyethylene glycol  17  g Oral BH-q7a  . sevelamer carbonate  800 mg Oral Q breakfast  . simvastatin  20 mg Oral Daily  . sodium chloride flush  3 mL Intravenous Q12H  . tiotropium  18 mcg Inhalation Daily  . vancomycin  750 mg Intravenous Q M,W,F-HD     Assessment/ Plan:  [name] is a 61 y.o. black male [name] is a 61 y.o. black male with ESRD on hemodialysis, hypertension, hyperlipidemia, anemia, COPD, peripheral vascular disease, left BKA, right toe amputations  TTS [facility]   1.  End stage renal disease with hyperkalemia on admission. Emergent hemodialysis on 3/2. Complication of dialysis device. Tunnel infection. Catheter removed 3/3,   - vancomycin for MRSA in blood - Monitor daily for dialysis need.  -Temp cath in place -permcath possibly today - OK to d/c from renal standpoint- patient will f/u as outpatient for further HD on Friday - VANC 750 mg iv each treatment called in to his dialysis unit x 4 weeks  2.  Anemia of chronic kidney disease: hemoglobin 10.4 - consider EPO once Hgb < 10  3. Hypertension: blood pressure at goal.  - midodrine before dialysis.   4. Secondary Hyperparathyroidism: - calcium acetate with meals.   5. Sepsis: blood cultures positive with MRSA. Catheter tip culture MRSA  blood cultures on 3/4 are negative so far     LOS: 5 Norvell Ureste 3/7/20181:15 PM

## 2016-07-31 NOTE — Progress Notes (Signed)
Discharged via stretcher escorted by EMS with paperwork in hand.

## 2016-07-31 NOTE — Progress Notes (Signed)
PT Cancellation Note  Patient Details Name: MALACAI GRANTZ MRN: [id number] DOB: 03-21-1956   Cancelled Treatment:    Reason Eval/Treat Not Completed: Patient at procedure or test/unavailable; Pt unavailable, out of room at procedure.  Will attempt to see pt at a future date/time.   Blanche East Chieko Neises 07/31/2016, 2:33 PM

## 2016-07-31 NOTE — H&P (Signed)
[facility] History & Physical Update  The patient was interviewed and re-examined.  The patient's previous History and Physical has been reviewed and is unchanged.  There is no change in the plan of care. We plan to proceed with the scheduled procedure.  Leotis Pain, MD  07/31/2016, 1:57 PM

## 2016-07-31 NOTE — Op Note (Signed)
OPERATIVE NOTE    PRE-OPERATIVE DIAGNOSIS: 1. ESRD 2. 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 Ultrasound guidance for vascular access to the left internal jugular vein 2. Left jugular venogram and superior venacavogram 3. Percutaneous transluminal angioplasty of left jugular vein and innominate vein with 7 mm diameter by 6 cm length Lutonix drug-coated angioplasty balloon 4. Fluoroscopic guidance for placement of catheter 5. Placement of a 28 cm tip to cuff tunneled hemodialysis catheter via the left internal jugular vein  SURGEON: Leotis Pain, MD  ANESTHESIA:  Local with Moderate conscious sedation for approximately 30 minutes using 3 mg of Versed and 75 mcg of Fentanyl  ESTIMATED BLOOD LOSS: 25 cc  CONTRAST: 10 cc  FINDING(S): 1.  Patent left internal jugular vein  SPECIMEN(S):  None  INDICATIONS:   [name] is a 61 y.o. male who presents with renal failure and needs a PermCath placed as he had to have his recent PermCath removed for infection.  The patient needs long term dialysis access for their ESRD, and a Permcath is necessary.  Risks and benefits are discussed and informed consent is obtained.    DESCRIPTION: After obtaining full informed written consent, the patient was brought back to the vascular suited. The patient's left neck and chest were sterilely prepped and draped in a sterile surgical field was created. Moderate conscious sedation was administered during a face to face encounter with the patient throughout the procedure with my supervision of the RN administering medicines and monitoring the patient's vital signs, pulse oximetry, telemetry and mental status throughout from the start of the procedure until the patient was taken to the recovery room.  The left internal jugular vein was visualized with ultrasound and found to be patent  higher up in the neck. It was then accessed under direct ultrasound guidance and a permanent image was recorded. A wire was placed but would not pass past the clavicle initially. A needle venogram was performed which showed occlusion of the jugular vein at the clavicle. Using a Glidewire, I was able to navigate through the occlusion and into the innominate vein and advanced down through the superior vena cava and the right atrium into the IVC. A Kumpe catheter was then placed into the IVC return of blood flow demonstrating intraluminal placement. A stiff wire was then placed and a 6 French sheath was placed over the wire. I then elected to perform angioplasty to open this area to place a catheter. A 7 mm diameter by 6 cm length Lutonix drug-coated angioplasty balloon was centered at the clavicle in the left jugular vein and just into the innominate vein. This was inflated to 12 atm with a tight waist that broke at about 11 or 12 atm. The inflation was held for 1 minute. The balloon was then removed and completion venogram following angioplasty still showed about a 50% residual stenosis in the proximal jugular vein just before the innominate vein, but this was markedly improved and would now allow catheter placement. The 6 French sheath was then removed and I dilated the tract with large dilators. After skin nick and dilatation, the peel-away sheath was placed over the wire. I then turned my attention to an area under the clavicle. Approximately 1-2 fingerbreadths below the clavicle a small counterincision was created and tunneled from the subclavicular incision to the access site. Using fluoroscopic guidance, a  28 centimeter tip  to cuff tunneled hemodialysis catheter was selected, and tunneled from the subclavicular incision to the access site. It was then placed through the peel-away sheath and the peel-away sheath was removed. Using fluoroscopic guidance the catheter tips were parked in the right atrium. The  appropriate distal connectors were placed. It withdrew blood well and flushed easily with heparinized saline and a concentrated heparin solution was then placed. It was secured to the chest wall with 2 Prolene sutures. The access incision was closed single 4-0 Monocryl. A 4-0 Monocryl pursestring suture was placed around the exit site. Sterile dressings were placed. The patient tolerated the procedure well and was taken to the recovery room in stable condition.  COMPLICATIONS: None  CONDITION: Stable  Leotis Pain  07/31/2016, 3:13 PM   This note was created with Dragon Medical transcription system. Any errors in dictation are purely unintentional.

## 2016-07-31 NOTE — NC FL2 (Signed)
[place] LEVEL OF CARE SCREENING TOOL     IDENTIFICATION  Patient Name: [name] Birthdate: 1955-12-18 Sex: male Admission Date (Current Location): 07/26/2016  [place] and [place] Number:  Engineering geologist and Address:  [facility], [address], [place], [place] [address]      Provider Number: [id number]  Attending Physician Name and Address:  Loletha Grayer, MD  Relative Name and Phone Number:       Current Level of Care: SNF Recommended Level of Care: [place] Prior Approval Number:    Date Approved/Denied:   PASRR Number: [id number]  Discharge Plan: SNF    Current Diagnoses: Patient Active Problem List   Diagnosis Date Noted  . NSTEMI (non-ST elevated myocardial infarction) ([place]) 07/26/2016  . Acute respiratory failure with hypoxia ([place]) 05/24/2016  . Right lower lobe pneumonia ([place]) 05/24/2016  . Elevated troponin 05/24/2016  . Pulmonary edema 05/21/2016  . Kidney dialysis as the cause of abnormal reaction of the patient, or of later complication, without mention of misadventure at the time of the procedure (CODE) 03/05/2016  . PVD (peripheral vascular disease) ([place]) 03/05/2016  . Preoperative cardiovascular examination 05/02/2015  . Chronic systolic heart failure ([place])   . Sepsis ([place]) 01/13/2015  . Acute encephalopathy 01/13/2015  . Hyperkalemia 01/13/2015  . Gangrene of foot ([place]) 05/04/2014  . ESRD (end stage renal disease) on dialysis ([place]) 05/04/2014  . CAD (coronary artery disease) 05/04/2014  . Normocytic anemia 05/04/2014  . Hyperlipidemia 04/26/2010  . HYPERTENSION, BENIGN 04/26/2010  . CAD, NATIVE VESSEL 04/26/2010  . End stage renal disease ([place]) 04/26/2010    Orientation RESPIRATION BLADDER Height & Weight     Self  Normal, O2 (2 liters) Continent Weight: 178 lb (80.7 kg) Height:  6\' 2"  (188 cm)  BEHAVIORAL SYMPTOMS/MOOD NEUROLOGICAL BOWEL NUTRITION STATUS   (none)   (none) Continent Diet  AMBULATORY STATUS COMMUNICATION OF NEEDS Skin   Extensive Assist Verbally Normal                       Personal Care Assistance Level of Assistance  Bathing, Feeding, Dressing Bathing Assistance: Maximum assistance Feeding assistance: Maximum assistance Dressing Assistance: Maximum assistance     Functional Limitations Info   (none)          SPECIAL CARE FACTORS FREQUENCY   (hemodialysis)                    Contractures Contractures Info: Not present    Additional Factors Info  Isolation Precautions Code Status Info: dnr Allergies Info: Dust Mite Extract           Current Medications (07/31/2016):  This is the current hospital active medication list Current Facility-Administered Medications  Medication Dose Route Frequency Provider Last Rate Last Dose  . 0.9 %  sodium chloride infusion   Intravenous Continuous Algernon Huxley, MD      . Doug Sou Hold] acetaminophen (TYLENOL) suppository 650 mg  650 mg Rectal Q6H PRN Vaughan Basta, MD   650 mg at 07/27/16 1710  . [MAR Hold] aspirin tablet 325 mg  325 mg Oral Martin Majestic, MD   325 mg at 07/31/16 4235  . [MAR Hold] calcium acetate (PHOSLO) capsule 667 mg  667 mg Oral TID WC Vaughan Basta, MD   667 mg at 07/30/16 1716  . [MAR Hold] Chlorhexidine Gluconate Cloth 2 % PADS 6 each  6 each Topical Q0600 Loletha Grayer, MD  6 each at 07/31/16 0600  . [MAR Hold] docusate sodium (COLACE) capsule 100 mg  100 mg Oral BID PRN Vaughan Basta, MD      . Doug Sou Hold] fluticasone (FLONASE) 50 MCG/ACT nasal spray 1 spray  1 spray Each Nare Daily Vaughan Basta, MD   Stopped at 07/30/16 1028  . [MAR Hold] heparin injection 5,000 Units  5,000 Units Subcutaneous Q8H Loletha Grayer, MD   5,000 Units at 07/31/16 [phone]  . [MAR Hold] insulin aspart (novoLOG) injection 0-9 Units  0-9 Units Subcutaneous TID WC Vaughan Basta, MD   Stopped at 07/30/16 1201  . [MAR Hold]  ipratropium-albuterol (DUONEB) 0.5-2.5 (3) MG/3ML nebulizer solution 3 mL  3 mL Nebulization Q4H PRN Vaughan Basta, MD      . midazolam (VERSED) injection    PRN Algernon Huxley, MD   2 mg at 07/31/16 1422  . [MAR Hold] midodrine (PROAMATINE) tablet 5 mg  5 mg Oral Q M,W,F Vaughan Basta, MD   5 mg at 07/29/16 0905  . [MAR Hold] mometasone-formoterol (DULERA) 200-5 MCG/ACT inhaler 2 puff  2 puff Inhalation BID Vaughan Basta, MD   2 puff at 07/30/16 2152  . [MAR Hold] multivitamin (RENA-VIT) tablet 1 tablet  1 tablet Oral QHS Vaughan Basta, MD   1 tablet at 07/30/16 2152  . [MAR Hold] multivitamin with minerals tablet 1 tablet  1 tablet Oral Daily Vaughan Basta, MD   Stopped at 07/30/16 1028  . [MAR Hold] mupirocin ointment (BACTROBAN) 2 % 1 application  1 application Nasal BID Loletha Grayer, MD   1 application at 80/99/83 2152  . [MAR Hold] oxyCODONE (Oxy IR/ROXICODONE) immediate release tablet 5 mg  5 mg Oral Q8H PRN Vaughan Basta, MD   5 mg at 07/29/16 0923  . [MAR Hold] pantoprazole (PROTONIX) EC tablet 40 mg  40 mg Oral Daily Vaughan Basta, MD   Stopped at 07/30/16 1028  . [MAR Hold] polyethylene glycol (MIRALAX / GLYCOLAX) packet 17 g  17 g Oral BH-q7a Vaughan Basta, MD      . Doug Sou Hold] sevelamer carbonate (RENVELA) tablet 800 mg  800 mg Oral Q breakfast Vaughan Basta, MD   800 mg at 07/30/16 0907  . [MAR Hold] simvastatin (ZOCOR) tablet 20 mg  20 mg Oral Daily Vaughan Basta, MD   Stopped at 07/30/16 1028  . [MAR Hold] sodium chloride flush (NS) 0.9 % injection 3 mL  3 mL Intravenous Q12H Vaughan Basta, MD   3 mL at 07/31/16 1000  . [MAR Hold] tiotropium (SPIRIVA) inhalation capsule 18 mcg  18 mcg Inhalation Daily Vaughan Basta, MD   18 mcg at 07/30/16 0908  . [MAR Hold] vancomycin (VANCOCIN) IVPB 750 mg/150 ml premix  750 mg Intravenous Q M,W,F-HD Lenis Noon, [facility]         Discharge  Medications: Please see discharge summary for a list of discharge medications.  Relevant Imaging Results:  Relevant Lab Results:   Additional Information SS# [id number]  Shela Leff, LCSW

## 2016-07-31 NOTE — Discharge Summary (Signed)
[facility] NAME: [name]    MR#:  [id number]  DATE OF BIRTH:  04-Mar-1956  DATE OF ADMISSION:  07/26/2016 ADMITTING PHYSICIAN: Vaughan Basta, MD  DATE OF DISCHARGE: 07/31/2016  PRIMARY CARE PHYSICIAN: [facility]    ADMISSION DIAGNOSIS:  Hyperkalemia [E87.5] Neck pain [M54.2] NSTEMI (non-ST elevated myocardial infarction) [facility]) [I21.4] Chest pain [R07.9] Bleeding due to dialysis catheter placement, initial encounter [facility]) [V89.381O]  DISCHARGE DIAGNOSIS:  And hospital course.  1. MRSA sepsis. Source was the dialysis catheter in the right chest. That was taken out on Saturday by vascular surgery. Vancomycin will be dosed with each dialysis for 4 weeks after the negative culture date on 07/28/2016. Last dose will be 08/27/2016. A temporary catheter was placed in the groin and this will be removed prior to discharge to facility. A new catheter is being placed today by Dr. do vascular surgery. 2. Acute encephalopathy this has resolved secondary to sepsis. 3. Acute respiratory failure with hypoxia. Can continue oxygen 2 L nasal cannula for pulse ox less than 88%. 4. Severe hyperkalemia improved with dialysis 5. Hyperlipidemia unspecified on simvastatin 6. GERD on PPI 7. Relative hypotension continue to hold antihypertensive medications. Continue midodrine 8. End-stage renal disease on hemodialysis. Dialysis days switched to Tuesday Thursday and Saturday as per Dr. Candiss Norse nephrology. Dialysis will be at [facility] on [address] seen on echocardiogram, with chronic systolic congestive heart failure. No change from previous. Dialysis to remove fluid. Blood pressure too low for any cardiac meds at this point. 10. Elevated troponin initially thought to be NSTEMI. Because of the patient's sepsis with altered mental status and hypotension this is demand ischemia. Patient was seen in consultation by cardiology  and no further workup was done.  SECONDARY DIAGNOSIS:   Past Medical History:  Diagnosis Date  . Anemia   . Asthma   . CHF (congestive heart failure) ([place])   . Chronic systolic heart failure ([place])   . Complication of anesthesia    hypotension  . COPD (chronic obstructive pulmonary disease) ([place])   . Coronary artery disease   . Dialysis patient ([place])    Mon, Wed, Fri  . End stage renal disease ([place])   . GERD (gastroesophageal reflux disease)   . Headache   . History of kidney stones   . HLD (hyperlipidemia)   . HTN (hypertension)   . Hyperparathyroidism   . Myocardial infarction   . Peripheral vascular disease ([place])   . Shortness of breath dyspnea   . Sleep apnea   . Tobacco dependence     .   DISCHARGE CONDITIONS:   Satisfactory  CONSULTS OBTAINED:  Treatment Team:  Isaias Cowman, MD Evaristo Bury, MD Leonel Ramsay, MD  DRUG ALLERGIES:   Allergies  Allergen Reactions  . Dust Mite Extract     DISCHARGE MEDICATIONS:   Current Discharge Medication List    START taking these medications   Details  Vancomycin (VANCOCIN) 750-5 MG/150ML-% SOLN With each dialysis, Tuesday, Thursday and Saturday. Last dose April 3rd Qty: 4000 mL      CONTINUE these medications which have CHANGED   Details  oxyCODONE-acetaminophen (PERCOCET) 7.5-325 MG tablet Take 1 tablet by mouth every 4 (four) hours as needed for severe pain. Qty: 12 tablet, Refills: 0      CONTINUE these medications which have NOT CHANGED   Details  acetaminophen (TYLENOL) 500 MG tablet Take 1,000 mg by mouth every 6 (  six) hours as needed for moderate pain.    albuterol (PROVENTIL HFA;VENTOLIN HFA) 108 (90 BASE) MCG/ACT inhaler Inhale 2 puffs into the lungs every 6 (six) hours as needed for wheezing or shortness of breath.     aspirin 325 MG tablet Take 325 mg by mouth every morning.     atorvastatin (LIPITOR) 10 MG tablet Take 10 mg by mouth at bedtime.     budesonide-formoterol  (SYMBICORT) 160-4.5 MCG/ACT inhaler Inhale 2 puffs into the lungs 2 (two) times daily. Reported on 08/14/2015    calcium acetate (PHOSLO) 667 MG capsule Take 667 mg by mouth 3 (three) times daily with meals.    Darbepoetin Alfa (ARANESP) 40 MCG/0.4ML SOSY injection Inject 0.4 mLs (40 mcg total) into the vein every Monday with hemodialysis. Qty: 8.4 mL    docusate sodium (COLACE) 100 MG capsule Take 100 mg by mouth 2 (two) times daily as needed. Reported on 08/14/2015    doxercalciferol (HECTOROL) 4 MCG/2ML injection Inject 1.25 mLs (2.5 mcg total) into the vein every Monday, Wednesday, and Friday with hemodialysis. Qty: 2 mL    ferric gluconate 125 mg in sodium chloride 0.9 % 100 mL Inject 125 mg into the vein every Monday, Wednesday, and Friday with hemodialysis.    fluticasone (FLONASE) 50 MCG/ACT nasal spray Place 1 spray into both nostrils daily.    ipratropium-albuterol (DUONEB) 0.5-2.5 (3) MG/3ML SOLN Take 3 mLs by nebulization every 4 (four) hours as needed (SOB).    midodrine (PROAMATINE) 5 MG tablet Take 5 mg by mouth. 3 times a week, 30 minutes before dialysis    Multiple Vitamins-Minerals (MULTIVITAMIN WITH MINERALS) tablet Take 1 tablet by mouth daily.    multivitamin (RENA-VIT) TABS tablet Take 1 tablet by mouth at bedtime. Refills: 0    Nutritional Supplements (FEEDING SUPPLEMENT, NEPRO CARB STEADY,) LIQD Take 237 mLs by mouth daily.    OxyCODONE HCl, Abuse Deter, (OXAYDO) 5 MG TABA Take 1 tablet by mouth every 8 (eight) hours as needed. Qty: 30 tablet, Refills: 0    pantoprazole (PROTONIX) 20 MG tablet Take 20 mg by mouth daily.    polyethylene glycol (MIRALAX / GLYCOLAX) packet Take 17 g by mouth every morning. Hold for loose stools.    pregabalin (LYRICA) 75 MG capsule Take 75 mg by mouth 2 (two) times daily.    sevelamer carbonate (RENVELA) 800 MG tablet Take 800 mg by mouth daily.     simvastatin (ZOCOR) 20 MG tablet Take 20 mg by mouth daily.    tiotropium  (SPIRIVA) 18 MCG inhalation capsule Place 18 mcg into inhaler and inhale daily.      STOP taking these medications     metoprolol tartrate (LOPRESSOR) 25 MG tablet      moxifloxacin (AVELOX) 400 MG tablet      KIONEX 15 GM/60ML suspension          DISCHARGE INSTRUCTIONS:   Follow up with dialysis on Tuesday Thursday and Saturday with vancomycin for 4 weeks. Follow up with doctor at facility  If you experience worsening of your admission symptoms, develop shortness of breath, life threatening emergency, suicidal or homicidal thoughts you must seek medical attention immediately by calling 911 or calling your MD immediately  if symptoms less severe.  You Must read complete instructions/literature along with all the possible adverse reactions/side effects for all the Medicines you take and that have been prescribed to you. Take any new Medicines after you have completely understood and accept all the possible adverse reactions/side effects.  Please note  You were cared for by a hospitalist during your hospital stay. If you have any questions about your discharge medications or the care you received while you were in the hospital after you are discharged, you can call the unit and asked to speak with the hospitalist on call if the hospitalist that took care of you is not available. Once you are discharged, your primary care physician will handle any further medical issues. Please note that NO REFILLS for any discharge medications will be authorized once you are discharged, as it is imperative that you return to your primary care physician (or establish a relationship with a primary care physician if you do not have one) for your aftercare needs so that they can reassess your need for medications and monitor your lab values.    Today   CHIEF COMPLAINT:   Chief Complaint  Patient presents with  . Chest Pain  . Vascular Access Problem  . Neck Pain    HISTORY OF PRESENT ILLNESS:   [name]  is a 61 y.o. male presented with chest and neck pain and found to have sepsis secondary to MRSA   VITAL SIGNS:  Blood pressure 122/87, pulse (!) 55, temperature 98.1 F (36.7 C), resp. rate 16, height 6\' 2"  (1.88 m), weight 80.7 kg (178 lb), SpO2 100 %.    PHYSICAL EXAMINATION:  GENERAL:  61 y.o.-year-old patient lying in the bed with no acute distress.  EYES: Pupils equal, round, reactive to light and accommodation. No scleral icterus. Extraocular muscles intact.  HEENT: Head atraumatic, normocephalic. Oropharynx and nasopharynx clear.  NECK:  Supple, no jugular venous distention. No thyroid enlargement, no tenderness.  LUNGS: Normal breath sounds bilaterally, no wheezing, rales,rhonchi or crepitation. No use of accessory muscles of respiration.  CARDIOVASCULAR: S1, S2 normal 2/6 systolic ejection murmur. No rubs, or gallops.  ABDOMEN: Soft, non-tender, non-distended. Bowel sounds present. No organomegaly or mass.  EXTREMITIES: No pedal edema, cyanosis, or clubbing.  NEUROLOGIC: Cranial nerves II through XII are intact. Muscle strength 5/5 in all extremities. Sensation intact. Gait not checked.  PSYCHIATRIC: The patient is alert and oriented x 3.  SKIN: No obvious rash, lesion, or ulcer.   DATA REVIEW:   CBC  Recent Labs Lab 07/30/16 1030  WBC 5.8  HGB 10.4*  HCT 31.2*  PLT 126*    Chemistries   Recent Labs Lab 07/30/16 1109  NA 137  K 4.9  CL 97*  CO2 29  GLUCOSE 99  BUN 60*  CREATININE 9.70*  CALCIUM 8.7*    Cardiac Enzymes  Recent Labs Lab 07/26/16 2046  TROPONINI 1.74*    Microbiology Results  Results for orders placed or performed during the hospital encounter of 07/26/16  Blood Culture (routine x 2)     Status: Abnormal   Collection Time: 07/26/16  8:08 AM  Result Value Ref Range Status   Specimen Description BLOOD LEFT ANTECUBITAL  Final   Special Requests BOTTLES DRAWN AEROBIC AND ANAEROBIC BCAV  Final   Culture  Setup Time    Final    GRAM POSITIVE COCCI ANAEROBIC BOTTLE ONLY CRITICAL VALUE NOTED.  VALUE IS CONSISTENT WITH PREVIOUSLY REPORTED AND CALLED VALUE.    Culture (A)  Final    STAPHYLOCOCCUS AUREUS SUSCEPTIBILITIES PERFORMED ON PREVIOUS CULTURE WITHIN THE LAST 5 DAYS. Performed at [facility], [place] [address], [place], [place] [address]    Report Status 07/29/2016 FINAL  Final  Blood Culture (routine x 2)  Status: Abnormal   Collection Time: 07/26/16  9:57 AM  Result Value Ref Range Status   Specimen Description BLOOD LEFT ARM  Final   Special Requests BOTTLES DRAWN AEROBIC AND ANAEROBIC BCHV  Final   Culture  Setup Time   Final    GRAM POSITIVE COCCI AEROBIC BOTTLE ONLY CRITICAL RESULT CALLED TO, READ BACK BY AND VERIFIED WITH: [name] COOKSON AT 2325 07/26/16.PMH Performed at [facility], [place] [address] [place], [place] [address]    Culture METHICILLIN RESISTANT STAPHYLOCOCCUS AUREUS (A)  Final   Report Status 07/29/2016 FINAL  Final   Organism ID, Bacteria METHICILLIN RESISTANT STAPHYLOCOCCUS AUREUS  Final      Susceptibility   Methicillin resistant staphylococcus aureus - MIC*    CIPROFLOXACIN >=8 RESISTANT Resistant     ERYTHROMYCIN >=8 RESISTANT Resistant     GENTAMICIN <=0.5 SENSITIVE Sensitive     OXACILLIN >=4 RESISTANT Resistant     TETRACYCLINE <=1 SENSITIVE Sensitive     VANCOMYCIN 1 SENSITIVE Sensitive     TRIMETH/SULFA <=10 SENSITIVE Sensitive     CLINDAMYCIN RESISTANT Resistant     RIFAMPIN <=0.5 SENSITIVE Sensitive     Inducible Clindamycin POSITIVE Resistant     * METHICILLIN RESISTANT STAPHYLOCOCCUS AUREUS  Blood Culture ID Panel (Reflexed)     Status: Abnormal   Collection Time: 07/26/16  9:57 AM  Result Value Ref Range Status   Enterococcus species NOT DETECTED NOT DETECTED Final   Listeria monocytogenes NOT DETECTED NOT DETECTED Final   Staphylococcus species DETECTED (A) NOT DETECTED Final    Comment: CRITICAL RESULT CALLED TO, READ BACK BY AND VERIFIED  WITH: [name] COOKSON AT 2325 07/26/16.PMH    Staphylococcus aureus DETECTED (A) NOT DETECTED Final    Comment: Methicillin (oxacillin)-resistant Staphylococcus aureus (MRSA). MRSA is predictably resistant to beta-lactam antibiotics (except ceftaroline). Preferred therapy is vancomycin unless clinically contraindicated. Patient requires contact precautions if  hospitalized. CRITICAL RESULT CALLED TO, READ BACK BY AND VERIFIED WITH: [name] COOKSON AT 2325 07/26/16.PMH    Methicillin resistance DETECTED (A) NOT DETECTED Final    Comment: CRITICAL RESULT CALLED TO, READ BACK BY AND VERIFIED WITH: [name] COOKSON AT 2325 07/26/16.PMH    Streptococcus species NOT DETECTED NOT DETECTED Final   Streptococcus agalactiae NOT DETECTED NOT DETECTED Final   Streptococcus pneumoniae NOT DETECTED NOT DETECTED Final   Streptococcus pyogenes NOT DETECTED NOT DETECTED Final   Acinetobacter baumannii NOT DETECTED NOT DETECTED Final   Enterobacteriaceae species NOT DETECTED NOT DETECTED Final   Enterobacter cloacae complex NOT DETECTED NOT DETECTED Final   Escherichia coli NOT DETECTED NOT DETECTED Final   Klebsiella oxytoca NOT DETECTED NOT DETECTED Final   Klebsiella pneumoniae NOT DETECTED NOT DETECTED Final   Proteus species NOT DETECTED NOT DETECTED Final   Serratia marcescens NOT DETECTED NOT DETECTED Final   Haemophilus influenzae NOT DETECTED NOT DETECTED Final   Neisseria meningitidis NOT DETECTED NOT DETECTED Final   Pseudomonas aeruginosa NOT DETECTED NOT DETECTED Final   Candida albicans NOT DETECTED NOT DETECTED Final   Candida glabrata NOT DETECTED NOT DETECTED Final   Candida krusei NOT DETECTED NOT DETECTED Final   Candida parapsilosis NOT DETECTED NOT DETECTED Final   Candida tropicalis NOT DETECTED NOT DETECTED Final  MRSA PCR Screening     Status: None   Collection Time: 07/27/16  1:10 AM  Result Value Ref Range Status   MRSA by PCR NEGATIVE NEGATIVE Final    Comment:        The GeneXpert  MRSA Assay (FDA approved for NASAL specimens only), is one component of a comprehensive MRSA colonization surveillance program. It is not intended to diagnose MRSA infection nor to guide or monitor treatment for MRSA infections.   Cath Tip Culture     Status: Abnormal   Collection Time: 07/27/16 12:07 PM  Result Value Ref Range Status   Specimen Description CATH TIP  Final   Special Requests NONE  Final   Culture (A)  Final    >=100,000 COLONIES/mL METHICILLIN RESISTANT STAPHYLOCOCCUS AUREUS   Report Status 07/30/2016 FINAL  Final   Organism ID, Bacteria METHICILLIN RESISTANT STAPHYLOCOCCUS AUREUS (A)  Final      Susceptibility   Methicillin resistant staphylococcus aureus - MIC*    CIPROFLOXACIN >=8 RESISTANT Resistant     ERYTHROMYCIN >=8 RESISTANT Resistant     GENTAMICIN <=0.5 SENSITIVE Sensitive     OXACILLIN >=4 RESISTANT Resistant     TETRACYCLINE <=1 SENSITIVE Sensitive     VANCOMYCIN 1 SENSITIVE Sensitive     TRIMETH/SULFA <=10 SENSITIVE Sensitive     CLINDAMYCIN RESISTANT Resistant     RIFAMPIN <=0.5 SENSITIVE Sensitive     Inducible Clindamycin POSITIVE Resistant     * >=100,000 COLONIES/mL METHICILLIN RESISTANT STAPHYLOCOCCUS AUREUS  CULTURE, BLOOD (ROUTINE X 2) w Reflex to ID Panel     Status: None (Preliminary result)   Collection Time: 07/28/16  1:59 PM  Result Value Ref Range Status   Specimen Description BLOOD  LEFT HAND  Final   Special Requests BOTTLES DRAWN AEROBIC AND ANAEROBIC  BCAV  Final   Culture NO GROWTH 3 DAYS  Final   Report Status PENDING  Incomplete  CULTURE, BLOOD (ROUTINE X 2) w Reflex to ID Panel     Status: None (Preliminary result)   Collection Time: 07/28/16  2:10 PM  Result Value Ref Range Status   Specimen Description BLOOD  LEFT WRIST  Final   Special Requests BOTTLES DRAWN AEROBIC AND ANAEROBIC  BCAV  Final   Culture NO GROWTH 3 DAYS  Final   Report Status PENDING  Incomplete       Management plans discussed with the patient,  family and they are in agreement.  CODE STATUS:     Code Status Orders        Start     Ordered   07/26/16 1708  Do not attempt resuscitation (DNR)  Continuous    Question Answer Comment  In the event of cardiac or respiratory ARREST Do not call a "code blue"   In the event of cardiac or respiratory ARREST Do not perform Intubation, CPR, defibrillation or ACLS   In the event of cardiac or respiratory ARREST Use medication by any route, position, wound care, and other measures to relive pain and suffering. May use oxygen, suction and manual treatment of airway obstruction as needed for comfort.   Comments confirmed with pt's sister in room at admission.      07/26/16 1707    Code Status History    Date Active Date Inactive Code Status Order ID Comments User Context   05/22/2016 12:34 AM 05/24/2016  7:11 PM Full Code [id number]  Harvie Bridge, DO Inpatient   01/13/2015  1:44 AM 01/14/2015  2:46 PM Full Code [id number]  Juluis Mire, MD ED   12/15/2014  9:32 AM 12/15/2014  2:31 PM Full Code [id number]  Algernon Huxley, MD Inpatient   05/06/2014  8:29 PM 05/10/2014  5:23 PM Full Code [id number]  Elam Dutch, MD  Inpatient   05/04/2014 10:24 PM 05/06/2014  8:29 PM Full Code [id number]  Rise Patience, MD Inpatient      TOTAL TIME TAKING CARE OF THIS PATIENT: 35 minutes.    Loletha Grayer M.D on 07/31/2016 at 2:24 PM  Between 7am to 6pm - Pager - [phone]  After 6pm go to [email or web address] - password [email or web address]  [facility] Office  [phone]  CC: Primary care physician; [place]

## 2016-08-02 ENCOUNTER — Encounter: Payer: Self-pay | Admitting: Vascular Surgery

## 2016-08-02 LAB — CULTURE, BLOOD (ROUTINE X 2)
Culture: NO GROWTH
Culture: NO GROWTH

## 2016-08-03 ENCOUNTER — Emergency Department: Payer: Medicare Other

## 2016-08-03 ENCOUNTER — Inpatient Hospital Stay
Admission: EM | Admit: 2016-08-03 | Discharge: 2016-08-07 | DRG: 175 | Disposition: A | Discharge status: Skilled Nursing Facility | Payer: Medicare Other | Attending: Internal Medicine | Admitting: Internal Medicine

## 2016-08-03 ENCOUNTER — Encounter: Payer: Self-pay | Admitting: *Deleted

## 2016-08-03 DIAGNOSIS — D631 Anemia in chronic kidney disease: Secondary | ICD-10-CM | POA: Diagnosis present

## 2016-08-03 DIAGNOSIS — Z7951 Long term (current) use of inhaled steroids: Secondary | ICD-10-CM

## 2016-08-03 DIAGNOSIS — Z79899 Other long term (current) drug therapy: Secondary | ICD-10-CM | POA: Diagnosis not present

## 2016-08-03 DIAGNOSIS — K219 Gastro-esophageal reflux disease without esophagitis: Secondary | ICD-10-CM | POA: Diagnosis present

## 2016-08-03 DIAGNOSIS — Z66 Do not resuscitate: Secondary | ICD-10-CM | POA: Diagnosis present

## 2016-08-03 DIAGNOSIS — I82412 Acute embolism and thrombosis of left femoral vein: Secondary | ICD-10-CM | POA: Diagnosis present

## 2016-08-03 DIAGNOSIS — F17210 Nicotine dependence, cigarettes, uncomplicated: Secondary | ICD-10-CM | POA: Diagnosis present

## 2016-08-03 DIAGNOSIS — Z7982 Long term (current) use of aspirin: Secondary | ICD-10-CM | POA: Diagnosis not present

## 2016-08-03 DIAGNOSIS — Z8249 Family history of ischemic heart disease and other diseases of the circulatory system: Secondary | ICD-10-CM

## 2016-08-03 DIAGNOSIS — J9601 Acute respiratory failure with hypoxia: Secondary | ICD-10-CM | POA: Diagnosis present

## 2016-08-03 DIAGNOSIS — I739 Peripheral vascular disease, unspecified: Secondary | ICD-10-CM | POA: Diagnosis present

## 2016-08-03 DIAGNOSIS — I10 Essential (primary) hypertension: Secondary | ICD-10-CM | POA: Diagnosis present

## 2016-08-03 DIAGNOSIS — G4730 Sleep apnea, unspecified: Secondary | ICD-10-CM | POA: Diagnosis present

## 2016-08-03 DIAGNOSIS — I2609 Other pulmonary embolism with acute cor pulmonale: Secondary | ICD-10-CM | POA: Diagnosis present

## 2016-08-03 DIAGNOSIS — I132 Hypertensive heart and chronic kidney disease with heart failure and with stage 5 chronic kidney disease, or end stage renal disease: Secondary | ICD-10-CM | POA: Diagnosis present

## 2016-08-03 DIAGNOSIS — I252 Old myocardial infarction: Secondary | ICD-10-CM | POA: Diagnosis not present

## 2016-08-03 DIAGNOSIS — Z89512 Acquired absence of left leg below knee: Secondary | ICD-10-CM

## 2016-08-03 DIAGNOSIS — R079 Chest pain, unspecified: Secondary | ICD-10-CM

## 2016-08-03 DIAGNOSIS — Z833 Family history of diabetes mellitus: Secondary | ICD-10-CM | POA: Diagnosis not present

## 2016-08-03 DIAGNOSIS — E785 Hyperlipidemia, unspecified: Secondary | ICD-10-CM | POA: Diagnosis present

## 2016-08-03 DIAGNOSIS — I2510 Atherosclerotic heart disease of native coronary artery without angina pectoris: Secondary | ICD-10-CM | POA: Diagnosis present

## 2016-08-03 DIAGNOSIS — I2699 Other pulmonary embolism without acute cor pulmonale: Secondary | ICD-10-CM

## 2016-08-03 DIAGNOSIS — N186 End stage renal disease: Secondary | ICD-10-CM | POA: Diagnosis present

## 2016-08-03 DIAGNOSIS — Z992 Dependence on renal dialysis: Secondary | ICD-10-CM | POA: Diagnosis not present

## 2016-08-03 DIAGNOSIS — Z806 Family history of leukemia: Secondary | ICD-10-CM

## 2016-08-03 DIAGNOSIS — N2581 Secondary hyperparathyroidism of renal origin: Secondary | ICD-10-CM | POA: Diagnosis present

## 2016-08-03 DIAGNOSIS — J449 Chronic obstructive pulmonary disease, unspecified: Secondary | ICD-10-CM | POA: Diagnosis present

## 2016-08-03 DIAGNOSIS — Z87442 Personal history of urinary calculi: Secondary | ICD-10-CM

## 2016-08-03 DIAGNOSIS — I5022 Chronic systolic (congestive) heart failure: Secondary | ICD-10-CM | POA: Diagnosis present

## 2016-08-03 DIAGNOSIS — Z89411 Acquired absence of right great toe: Secondary | ICD-10-CM

## 2016-08-03 LAB — CBC
HEMATOCRIT: 33.4 % — AB (ref 40.0–52.0)
HEMOGLOBIN: 11.3 g/dL — AB (ref 13.0–18.0)
MCH: 31.6 pg (ref 26.0–34.0)
MCHC: 34 g/dL (ref 32.0–36.0)
MCV: 93.1 fL (ref 80.0–100.0)
Platelets: 232 10*3/uL (ref 150–440)
RBC: 3.59 MIL/uL — AB (ref 4.40–5.90)
RDW: 16.4 % — ABNORMAL HIGH (ref 11.5–14.5)
WBC: 9.8 10*3/uL (ref 3.8–10.6)

## 2016-08-03 LAB — HEPATIC FUNCTION PANEL
ALBUMIN: 2.9 g/dL — AB (ref 3.5–5.0)
ALT: 30 U/L (ref 17–63)
AST: 51 U/L — AB (ref 15–41)
Alkaline Phosphatase: 78 U/L (ref 38–126)
Bilirubin, Direct: <0.1 mg/dL — ABNORMAL LOW (ref 0.1–0.5)
TOTAL PROTEIN: 7.8 g/dL (ref 6.5–8.1)
Total Bilirubin: 0.7 mg/dL (ref 0.3–1.2)

## 2016-08-03 LAB — BASIC METABOLIC PANEL
ANION GAP: 13 (ref 5–15)
BUN: 57 mg/dL — ABNORMAL HIGH (ref 6–20)
CALCIUM: 9.3 mg/dL (ref 8.9–10.3)
CO2: 26 mmol/L (ref 22–32)
Chloride: 97 mmol/L — ABNORMAL LOW (ref 101–111)
Creatinine, Ser: 9.54 mg/dL — ABNORMAL HIGH (ref 0.61–1.24)
GFR calc non Af Amer: 5 mL/min — ABNORMAL LOW (ref >60)
GFR, EST AFRICAN AMERICAN: 6 mL/min — AB (ref >60)
Glucose, Bld: 88 mg/dL (ref 65–99)
POTASSIUM: 4.5 mmol/L (ref 3.5–5.1)
Sodium: 136 mmol/L (ref 135–145)

## 2016-08-03 LAB — TROPONIN I
TROPONIN I: 0.21 ng/mL — AB (ref <0.03)
TROPONIN I: 0.22 ng/mL — AB (ref <0.03)

## 2016-08-03 LAB — BRAIN NATRIURETIC PEPTIDE: B Natriuretic Peptide: 800 pg/mL — ABNORMAL HIGH (ref 0.0–100.0)

## 2016-08-03 LAB — PROTIME-INR
INR: 1.01
Prothrombin Time: 13.3 seconds (ref 11.4–15.2)

## 2016-08-03 LAB — APTT: APTT: 33 s (ref 24–36)

## 2016-08-03 LAB — LACTIC ACID, PLASMA: LACTIC ACID, VENOUS: 1.3 mmol/L (ref 0.5–1.9)

## 2016-08-03 MED ORDER — OXYCODONE-ACETAMINOPHEN 7.5-325 MG PO TABS
1.0000 | ORAL_TABLET | ORAL | Status: DC | PRN
  Administered 2016-08-04: 1 via ORAL
  Filled 2016-08-03: qty 1

## 2016-08-03 MED ORDER — TIOTROPIUM BROMIDE MONOHYDRATE 18 MCG IN CAPS
18.0000 ug | ORAL_CAPSULE | Freq: Every day | RESPIRATORY_TRACT | Status: DC
  Administered 2016-08-04 – 2016-08-07 (×4): 18 ug via RESPIRATORY_TRACT
  Filled 2016-08-03: qty 5

## 2016-08-03 MED ORDER — ATORVASTATIN CALCIUM 10 MG PO TABS
10.0000 mg | ORAL_TABLET | Freq: Every day | ORAL | Status: DC
  Administered 2016-08-03 – 2016-08-06 (×4): 10 mg via ORAL
  Filled 2016-08-03 (×5): qty 1

## 2016-08-03 MED ORDER — ACETAMINOPHEN 650 MG RE SUPP: 650.0000 mg | Freq: Four times a day (QID) | RECTAL | Status: DC | PRN

## 2016-08-03 MED ORDER — ACETAMINOPHEN 325 MG PO TABS: 650.0000 mg | ORAL_TABLET | Freq: Four times a day (QID) | ORAL | Status: DC | PRN

## 2016-08-03 MED ORDER — PREGABALIN 75 MG PO CAPS
75.0000 mg | ORAL_CAPSULE | Freq: Two times a day (BID) | ORAL | Status: DC
  Administered 2016-08-03 – 2016-08-07 (×7): 75 mg via ORAL
  Filled 2016-08-03 (×8): qty 1

## 2016-08-03 MED ORDER — IPRATROPIUM-ALBUTEROL 0.5-2.5 (3) MG/3ML IN SOLN: 3.0000 mL | RESPIRATORY_TRACT | Status: DC | PRN

## 2016-08-03 MED ORDER — ASPIRIN 325 MG PO TABS
325.0000 mg | ORAL_TABLET | ORAL | Status: DC
Start: 2016-08-04 — End: 2016-08-07
  Administered 2016-08-04 – 2016-08-07 (×3): 325 mg via ORAL
  Filled 2016-08-03 (×4): qty 1

## 2016-08-03 MED ORDER — PANTOPRAZOLE SODIUM 20 MG PO TBEC: 20.0000 mg | DELAYED_RELEASE_TABLET | Freq: Every day | ORAL | Status: DC

## 2016-08-03 MED ORDER — ONDANSETRON HCL 4 MG/2ML IJ SOLN: 4.0000 mg | Freq: Four times a day (QID) | INTRAMUSCULAR | Status: DC | PRN

## 2016-08-03 MED ORDER — CALCIUM ACETATE (PHOS BINDER) 667 MG PO CAPS
667.0000 mg | ORAL_CAPSULE | Freq: Three times a day (TID) | ORAL | Status: DC
  Administered 2016-08-04 – 2016-08-07 (×8): 667 mg via ORAL
  Filled 2016-08-03 (×10): qty 1

## 2016-08-03 MED ORDER — VANCOMYCIN HCL IN DEXTROSE 750-5 MG/150ML-% IV SOLN
750.0000 mg | INTRAVENOUS | Status: DC | PRN
  Filled 2016-08-03: qty 150

## 2016-08-03 MED ORDER — MIDODRINE HCL 5 MG PO TABS: 5.0000 mg | ORAL_TABLET | ORAL | Status: DC | PRN

## 2016-08-03 MED ORDER — ONDANSETRON HCL 4 MG PO TABS: 4.0000 mg | ORAL_TABLET | Freq: Four times a day (QID) | ORAL | Status: DC | PRN

## 2016-08-03 MED ORDER — HEPARIN BOLUS VIA INFUSION
4700.0000 [IU] | Freq: Once | INTRAVENOUS | Status: AC
  Administered 2016-08-03: 4700 [IU] via INTRAVENOUS
  Filled 2016-08-03: qty 4700

## 2016-08-03 MED ORDER — HEPARIN (PORCINE) IN NACL 100-0.45 UNIT/ML-% IJ SOLN
1400.0000 [IU]/h | INTRAMUSCULAR | Status: DC
  Administered 2016-08-03 – 2016-08-05 (×3): 1300 [IU]/h via INTRAVENOUS
  Administered 2016-08-06 – 2016-08-07 (×2): 1400 [IU]/h via INTRAVENOUS
  Filled 2016-08-03 (×5): qty 250

## 2016-08-03 MED ORDER — PANTOPRAZOLE SODIUM 40 MG PO TBEC
40.0000 mg | DELAYED_RELEASE_TABLET | Freq: Every day | ORAL | Status: DC
  Administered 2016-08-04 – 2016-08-07 (×3): 40 mg via ORAL
  Filled 2016-08-03 (×4): qty 1

## 2016-08-03 MED ORDER — MOMETASONE FURO-FORMOTEROL FUM 200-5 MCG/ACT IN AERO
2.0000 | INHALATION_SPRAY | Freq: Two times a day (BID) | RESPIRATORY_TRACT | Status: DC
  Administered 2016-08-03 – 2016-08-07 (×7): 2 via RESPIRATORY_TRACT
  Filled 2016-08-03 (×2): qty 8.8

## 2016-08-03 MED ORDER — IOPAMIDOL (ISOVUE-370) INJECTION 76%
75.0000 mL | Freq: Once | INTRAVENOUS | Status: AC | PRN
  Administered 2016-08-03: 75 mL via INTRAVENOUS

## 2016-08-03 MED ORDER — SEVELAMER CARBONATE 800 MG PO TABS
800.0000 mg | ORAL_TABLET | Freq: Every day | ORAL | Status: DC
  Administered 2016-08-04 – 2016-08-07 (×3): 800 mg via ORAL
  Filled 2016-08-03 (×4): qty 1

## 2016-08-03 NOTE — ED Notes (Signed)
Patient transported to CT 

## 2016-08-03 NOTE — ED Triage Notes (Signed)
Pt to ED reporting centralized CP beginning in the last hour. Pt reports having the same chest pain last week but was unsure of dx pt reports being "out of it" while in ED. Pt reports having SOB and is diaphoretic in triage. Hx of smoking and HTN. Pt denies NV, dizziness or cough. Pt has hx of CHF but denies wight gain. No edema noted at this time.   Pt is a MWF dialysis pt and reports having been yesterday.

## 2016-08-03 NOTE — ED Notes (Signed)
Pt ambulated 20 feet and o2 sat decreased to 74% - pt taken back to bed and placed on 2L of o2 via n/c - o2 sat increased to 99% - pt is now c/o chest pain central in chest with what appear to be changes in EKG - Dr Quentin Cornwall notified and EKG taken and printed for MD

## 2016-08-03 NOTE — ED Notes (Signed)
[name] on 2A called to check on status of bed, bed has be assigned since 2040.  Reports that pager was on silent, this requests to give report at this time.  On hold until 2235 to give report to RN.

## 2016-08-03 NOTE — H&P (Signed)
[facility] NAME: [name]    MR#:  [id number]  DATE OF BIRTH:  1956/01/13  DATE OF ADMISSION:  08/03/2016  PRIMARY CARE PHYSICIAN: [place]   REQUESTING/REFERRING PHYSICIAN: Quentin Cornwall, MD  CHIEF COMPLAINT:   Chief Complaint  Patient presents with  . Chest Pain    HISTORY OF PRESENT ILLNESS:  [name]  is a 61 y.o. male who presents with Acute onset of chest pain today. He had associated shortness of breath. Here in the ED he was found on CT imaging to have a pulmonary embolus. IV heparin was started and hospitalists were called for admission.  Of note, on chart review it seems the patient has been on vancomycin with dialysis, and has a schedule to continue. He states that this was due to a blood infection that was found.  PAST MEDICAL HISTORY:   Past Medical History:  Diagnosis Date  . Anemia   . Asthma   . CHF (congestive heart failure) ([place])   . Chronic systolic heart failure ([place])   . Complication of anesthesia    hypotension  . COPD (chronic obstructive pulmonary disease) ([place])   . Coronary artery disease   . Dialysis patient ([place])    Mon, Wed, Fri  . End stage renal disease ([place])   . GERD (gastroesophageal reflux disease)   . Headache   . History of kidney stones   . HLD (hyperlipidemia)   . HTN (hypertension)   . Hyperparathyroidism   . Myocardial infarction   . Peripheral vascular disease ([place])   . Shortness of breath dyspnea   . Sleep apnea   . Tobacco dependence     PAST SURGICAL HISTORY:   Past Surgical History:  Procedure Laterality Date  . AMPUTATION Left 05/06/2014   Procedure: AMPUTATION BELOW KNEE;  Surgeon: Elam Dutch, MD;  Location: [facility];  Service: Vascular;  Laterality: Left;  . AMPUTATION Right 01/12/2015   Procedure: Foot transmetatarsal amputation;  Surgeon: Algernon Huxley, MD;  Location: [facility];  Service: Vascular;  Laterality: Right;  . APPLICATION  OF WOUND VAC Right 03/01/2015   Procedure: Application of Bio-connekt graft and wound vac application to right foot ;  Surgeon: Algernon Huxley, MD;  Location: [facility];  Service: Vascular;  Laterality: Right;  . AV FISTULA PLACEMENT Left   . CARDIAC CATHETERIZATION     stent placement   . CORONARY ANGIOPLASTY    . DIALYSIS/PERMA CATHETER INSERTION N/A 07/31/2016   Procedure: Dialysis/Perma Catheter Insertion;  Surgeon: Algernon Huxley, MD;  Location: [facility];  Service: Cardiovascular;  Laterality: N/A;  . LIGATION OF ARTERIOVENOUS  FISTULA Right 01/31/2016   Procedure: LIGATION OF ARTERIOVENOUS  FISTULA;  Surgeon: Algernon Huxley, MD;  Location: [facility];  Service: Vascular;  Laterality: Right;  . PERIPHERAL VASCULAR CATHETERIZATION Right 12/15/2014   Procedure: Lower Extremity Angiography;  Surgeon: Algernon Huxley, MD;  Location: [facility];  Service: Cardiovascular;  Laterality: Right;  . PERIPHERAL VASCULAR CATHETERIZATION  12/15/2014   Procedure: Lower Extremity Intervention;  Surgeon: Algernon Huxley, MD;  Location: [facility];  Service: Cardiovascular;;  . PERIPHERAL VASCULAR CATHETERIZATION Right 08/14/2015   Procedure: A/V Shuntogram/Fistulagram;  Surgeon: Algernon Huxley, MD;  Location: [name];  Service: Cardiovascular;  Laterality: Right;  . PERIPHERAL VASCULAR CATHETERIZATION N/A 08/14/2015   Procedure: A/V Shunt Intervention;  Surgeon: Algernon Huxley, MD;  Location: [facility]  INVASIVE CV LAB;  Service: Cardiovascular;  Laterality: N/A;  . PERIPHERAL VASCULAR CATHETERIZATION N/A 01/11/2016   Procedure: Dialysis/Perma Catheter Insertion;  Surgeon: Algernon Huxley, MD;  Location: [facility];  Service: Cardiovascular;  Laterality: N/A;  . REVISON OF ARTERIOVENOUS FISTULA Right 02/17/2016   Procedure: removal of AV fistula;  Surgeon: Serafina Mitchell, MD;  Location: [facility];  Service: Vascular;  Laterality: Right;  . REVISON OF ARTERIOVENOUS FISTULA Right 01/31/2016    Procedure: REVISON OF ARTERIOVENOUS FISTULA ( BRACHIOCEPHALIC ) W/ ARTEGRAFT;  Surgeon: Algernon Huxley, MD;  Location: [facility];  Service: Vascular;  Laterality: Right;  . TRANSMETATARSAL AMPUTATION Right 05/04/2015   Procedure: TRANSMETATARSAL AMPUTATION REVISION, great toe amputation;  Surgeon: Algernon Huxley, MD;  Location: [facility];  Service: Vascular;  Laterality: Right;    SOCIAL HISTORY:   Social History  Substance Use Topics  . Smoking status: Light Tobacco Smoker    Packs/day: 0.25    Types: Cigarettes  . Smokeless tobacco: Never Used     Comment: 5  . Alcohol use No    FAMILY HISTORY:   Family History  Problem Relation Age of Onset  . Heart failure Other   . Hypertension Other   . Leukemia Other   . Diabetes Other     DRUG ALLERGIES:   Allergies  Allergen Reactions  . Dust Mite Extract     MEDICATIONS AT HOME:   Prior to Admission medications   Medication Sig Start Date End Date Taking? Authorizing Provider  acetaminophen (TYLENOL) 500 MG tablet Take 1,000 mg by mouth every 6 (six) hours as needed for moderate pain.    Historical Provider, MD  albuterol (PROVENTIL HFA;VENTOLIN HFA) 108 (90 BASE) MCG/ACT inhaler Inhale 2 puffs into the lungs every 6 (six) hours as needed for wheezing or shortness of breath.     Historical Provider, MD  aspirin 325 MG tablet Take 325 mg by mouth every morning.     Historical Provider, MD  atorvastatin (LIPITOR) 10 MG tablet Take 10 mg by mouth at bedtime.     Historical Provider, MD  budesonide-formoterol (SYMBICORT) 160-4.5 MCG/ACT inhaler Inhale 2 puffs into the lungs 2 (two) times daily. Reported on 08/14/2015    Historical Provider, MD  calcium acetate (PHOSLO) 667 MG capsule Take 667 mg by mouth 3 (three) times daily with meals.    Historical Provider, MD  Darbepoetin Alfa (ARANESP) 40 MCG/0.4ML SOSY injection Inject 0.4 mLs (40 mcg total) into the vein every Monday with hemodialysis. 05/16/14   Shanker Kristeen Mans, MD  docusate sodium  (COLACE) 100 MG capsule Take 100 mg by mouth 2 (two) times daily as needed. Reported on 08/14/2015    Historical Provider, MD  doxercalciferol (HECTOROL) 4 MCG/2ML injection Inject 1.25 mLs (2.5 mcg total) into the vein every Monday, Wednesday, and Friday with hemodialysis. 05/10/14   Shanker Kristeen Mans, MD  ferric gluconate 125 mg in sodium chloride 0.9 % 100 mL Inject 125 mg into the vein every Monday, Wednesday, and Friday with hemodialysis. 05/10/14   Shanker Kristeen Mans, MD  fluticasone (FLONASE) 50 MCG/ACT nasal spray Place 1 spray into both nostrils daily.    Historical Provider, MD  ipratropium-albuterol (DUONEB) 0.5-2.5 (3) MG/3ML SOLN Take 3 mLs by nebulization every 4 (four) hours as needed (SOB).    Historical Provider, MD  midodrine (PROAMATINE) 5 MG tablet Take 5 mg by mouth. 3 times a week, 30 minutes before dialysis    Historical Provider, MD  Multiple  Vitamins-Minerals (MULTIVITAMIN WITH MINERALS) tablet Take 1 tablet by mouth daily.    Historical Provider, MD  multivitamin (RENA-VIT) TABS tablet Take 1 tablet by mouth at bedtime. 05/10/14   Shanker Kristeen Mans, MD  Nutritional Supplements (FEEDING SUPPLEMENT, NEPRO CARB STEADY,) LIQD Take 237 mLs by mouth daily.    Historical Provider, MD  OxyCODONE HCl, Abuse Deter, (OXAYDO) 5 MG TABA Take 1 tablet by mouth every 8 (eight) hours as needed. 05/24/16   Theodoro Grist, MD  oxyCODONE-acetaminophen (PERCOCET) 7.5-325 MG tablet Take 1 tablet by mouth every 4 (four) hours as needed for severe pain. 07/31/16   Loletha Grayer, MD  pantoprazole (PROTONIX) 20 MG tablet Take 20 mg by mouth daily.    Historical Provider, MD  polyethylene glycol (MIRALAX / GLYCOLAX) packet Take 17 g by mouth every morning. Hold for loose stools.    Historical Provider, MD  pregabalin (LYRICA) 75 MG capsule Take 75 mg by mouth 2 (two) times daily.    Historical Provider, MD  sevelamer carbonate (RENVELA) 800 MG tablet Take 800 mg by mouth daily.     Historical Provider, MD   simvastatin (ZOCOR) 20 MG tablet Take 20 mg by mouth daily.    Historical Provider, MD  tiotropium (SPIRIVA) 18 MCG inhalation capsule Place 18 mcg into inhaler and inhale daily.    Historical Provider, MD  Vancomycin [facility]) 750-5 MG/150ML-% SOLN With each dialysis, Tuesday, Thursday and Saturday. Last dose April 3rd 07/31/16   Loletha Grayer, MD    REVIEW OF SYSTEMS:  Review of Systems  Constitutional: Negative for chills, fever, malaise/fatigue and weight loss.  HENT: Negative for ear pain, hearing loss and tinnitus.   Eyes: Negative for blurred vision, double vision, pain and redness.  Respiratory: Positive for shortness of breath. Negative for cough and hemoptysis.   Cardiovascular: Positive for chest pain. Negative for palpitations, orthopnea and leg swelling.  Gastrointestinal: Negative for abdominal pain, constipation, diarrhea, nausea and vomiting.  Genitourinary: Negative for dysuria, frequency and hematuria.  Musculoskeletal: Negative for back pain, joint pain and neck pain.  Skin:       No acne, rash, or lesions  Neurological: Negative for dizziness, tremors, focal weakness and weakness.  Endo/Heme/Allergies: Negative for polydipsia. Does not bruise/bleed easily.  Psychiatric/Behavioral: Negative for depression. The patient is not nervous/anxious and does not have insomnia.      VITAL SIGNS:   Vitals:   08/03/16 1600 08/03/16 1821 08/03/16 1916 08/03/16 2021  BP:   (!) 149/69   Pulse:   81 81  Resp:   14 14  Temp:      TempSrc:      SpO2:  (!) 74% 100% 100%  Weight: 78 kg (172 lb)     Height: 6\' 2"  (1.88 m)      Wt Readings from Last 3 Encounters:  08/03/16 78 kg (172 lb)  07/31/16 80.7 kg (178 lb)  07/25/16 80.3 kg (177 lb)    PHYSICAL EXAMINATION:  Physical Exam  Vitals reviewed. Constitutional: He is oriented to person, place, and time. He appears well-developed and well-nourished. No distress.  HENT:  Head: Normocephalic and atraumatic.   Mouth/Throat: Oropharynx is clear and moist.  Eyes: Conjunctivae and EOM are normal. Pupils are equal, round, and reactive to light. No scleral icterus.  Neck: Normal range of motion. Neck supple. No JVD present. No thyromegaly present.  Cardiovascular: Normal rate, regular rhythm and intact distal pulses.  Exam reveals no gallop and no friction rub.   No murmur heard.  Respiratory: Effort normal and breath sounds normal. No respiratory distress. He has no wheezes. He has no rales.  GI: Soft. Bowel sounds are normal. He exhibits no distension. There is no tenderness.  Musculoskeletal: Normal range of motion. He exhibits deformity (left lower extremity amputation). He exhibits no edema.  No arthritis, no gout  Lymphadenopathy:    He has no cervical adenopathy.  Neurological: He is alert and oriented to person, place, and time. No cranial nerve deficit.  No dysarthria, no aphasia  Skin: Skin is warm and dry. No rash noted. No erythema.  Psychiatric: He has a normal mood and affect. His behavior is normal. Judgment and thought content normal.    LABORATORY PANEL:   CBC  Recent Labs Lab 08/03/16 1602  WBC 9.8  HGB 11.3*  HCT 33.4*  PLT 232   ------------------------------------------------------------------------------------------------------------------  Chemistries   Recent Labs Lab 08/03/16 1602 08/03/16 1657  NA 136  --   K 4.5  --   CL 97*  --   CO2 26  --   GLUCOSE 88  --   BUN 57*  --   CREATININE 9.54*  --   CALCIUM 9.3  --   AST  --  51*  ALT  --  30  ALKPHOS  --  78  BILITOT  --  0.7   ------------------------------------------------------------------------------------------------------------------  Cardiac Enzymes  Recent Labs Lab 08/03/16 1954  TROPONINI 0.22*   ------------------------------------------------------------------------------------------------------------------  RADIOLOGY:  Dg Chest 2 View  Result Date: 08/03/2016 CLINICAL DATA:   Acute chest pain and shortness of breath. EXAM: CHEST  2 VIEW COMPARISON:  07/26/2016 and prior radiograph FINDINGS: Cardiomegaly and left central venous catheter with tip overlying the superior cavoatrial junction noted. A small right pleural effusion and right basilar atelectasis/scarring again noted. There is no evidence of pulmonary edema, left pleural effusion or pneumothorax. IMPRESSION: Cardiomegaly without evidence of acute cardiopulmonary disease. Small right pleural effusion and right basilar atelectasis/scarring again noted. Electronically Signed   By: Margarette Canada M.D.   On: 08/03/2016 16:59   Ct Angio Chest Pe W And/or Wo Contrast  Result Date: 08/03/2016 CLINICAL DATA:  61 year old male with chest pain and shortness of breath. History of end-stage renal disease and CHF. EXAM: CT ANGIOGRAPHY CHEST WITH CONTRAST TECHNIQUE: Multidetector CT imaging of the chest was performed using the standard protocol during bolus administration of intravenous contrast. Multiplanar CT image reconstructions and MIPs were obtained to evaluate the vascular anatomy. CONTRAST:  75 cc intravenous Isovue 370 COMPARISON:  08/03/2016 and prior chest radiographs. 07/06/2013 chest CT FINDINGS: Cardiovascular: Pulmonary emboli within the right upper lobe noted. RV/LV ratio is 0.72. Cardiomegaly and moderate to heavy coronary artery calcifications again noted. Ectasia of the thoracic aorta noted without aneurysm. No pericardial effusion identified. A right subclavian stent is noted. A left IJ central venous catheter with tip at the superior cavoatrial junction noted. Mediastinum/Nodes: No mediastinal mass or enlarged lymph nodes. Lungs/Pleura: A small right pleural effusion noted with right lower lobe atelectasis/ scarring. No airspace disease or mass identified. There is no evidence of pneumothorax. Upper Abdomen: No acute abnormality. Musculoskeletal: No acute or suspicious focal abnormalities noted. Review of the MIP images  confirms the above findings. IMPRESSION: Right upper lobe pulmonary emboli. No CT evidence of right heart strain. Small right pleural effusion and right lower lobe atelectasis/ scarring. Cardiomegaly and coronary artery disease. Critical Value/emergent results were called by telephone at the time of interpretation on 08/03/2016 at 7:52 pm to Dr. Merlyn Lot , who  verbally acknowledged these results. Electronically Signed   By: Margarette Canada M.D.   On: 08/03/2016 19:56    EKG:   Orders placed or performed during the hospital encounter of 08/03/16  . EKG 12-Lead  . EKG 12-Lead  . ED EKG within 10 minutes  . ED EKG within 10 minutes  . EKG 12-Lead  . EKG 12-Lead    IMPRESSION AND PLAN:  Principal Problem:   Pulmonary embolism (HCC) - IV heparin. CT imaging showed no evidence of right heart strain. We will get an echocardiogram in the morning. Active Problems:   HYPERTENSION, BENIGN - stable, continue home meds   CAD, NATIVE VESSEL - patient's troponin was elevated, likely related to his pulmonary embolus. However, he has not had cardiac workup for some time so we will get a cardiology consult.   ESRD (end stage renal disease) on dialysis [facility]) - nephrology consult for hemodialysis support   Chronic systolic heart failure ([place]) - continue home meds   Hyperlipidemia - continue home meds  All the records are reviewed and case discussed with ED provider. Management plans discussed with the patient and/or family.  DVT PROPHYLAXIS: Systemic anticoagulation  GI PROPHYLAXIS: PPI  ADMISSION STATUS: Inpatient  CODE STATUS: Full Code Status History    Date Active Date Inactive Code Status Order ID Comments User Context   07/26/2016  5:07 PM 07/31/2016  9:58 PM DNR [id number]  Vaughan Basta, MD Inpatient   05/22/2016 12:34 AM 05/24/2016  7:11 PM Full Code [id number]  Harvie Bridge, DO Inpatient   01/13/2015  1:44 AM 01/14/2015  2:46 PM Full Code [id number]  Juluis Mire, MD ED    12/15/2014  9:32 AM 12/15/2014  2:31 PM Full Code [id number]  Algernon Huxley, MD Inpatient   05/06/2014  8:29 PM 05/10/2014  5:23 PM Full Code [id number]  Elam Dutch, MD Inpatient   05/04/2014 10:24 PM 05/06/2014  8:29 PM Full Code [id number]  Rise Patience, MD Inpatient    Questions for Most Recent Historical Code Status (Order [id number])    Question Answer Comment   In the event of cardiac or respiratory ARREST Do not call a "code blue"    In the event of cardiac or respiratory ARREST Do not perform Intubation, CPR, defibrillation or ACLS    In the event of cardiac or respiratory ARREST Use medication by any route, position, wound care, and other measures to relive pain and suffering. May use oxygen, suction and manual treatment of airway obstruction as needed for comfort.    Comments confirmed with pt's sister in room at admission.       TOTAL TIME TAKING CARE OF THIS PATIENT: 45 minutes.    [name] 08/03/2016, 8:35 PM  [name]  Office  [phone]  CC: Primary care physician; [place]

## 2016-08-03 NOTE — ED Provider Notes (Signed)
[facility] Emergency Department Provider Note    None    (approximate)  I have reviewed the triage vital signs and the nursing notes.   HISTORY  Chief Complaint Chest Pain    HPI [name] is a 61 y.o. male presents with diffuse chest pain that occurred roughly 1 hour prior to arrival while patient was getting in his car to drive back to [place]. Patient with very complex recent past medical history. Recently admitted for sepsis with line infection. Also had an STEMI thought secondary to sepsis. States that today's episode of curb while he was seated and lasted roughly 20 minutes in route to the hospital. Also admits to worsening shortness of breath and exertional dyspnea. Denies any chest pain or pressure right now. Says the pain was not radiating through to his back. He's not had any fever. No nausea or vomiting. No diaphoresis. He had dialysis yesterday.   Past Medical History:  Diagnosis Date  . Anemia   . Asthma   . CHF (congestive heart failure) ([place])   . Chronic systolic heart failure ([place])   . Complication of anesthesia    hypotension  . COPD (chronic obstructive pulmonary disease) ([place])   . Coronary artery disease   . Dialysis patient ([place])    Mon, Wed, Fri  . End stage renal disease ([place])   . GERD (gastroesophageal reflux disease)   . Headache   . History of kidney stones   . HLD (hyperlipidemia)   . HTN (hypertension)   . Hyperparathyroidism   . Myocardial infarction   . Peripheral vascular disease ([place])   . Shortness of breath dyspnea   . Sleep apnea   . Tobacco dependence    Family History  Problem Relation Age of Onset  . Heart failure Other   . Hypertension Other   . Leukemia Other   . Diabetes Other    Past Surgical History:  Procedure Laterality Date  . AMPUTATION Left 05/06/2014   Procedure: AMPUTATION BELOW KNEE;  Surgeon: Elam Dutch, MD;  Location: [place];  Service: Vascular;  Laterality: Left;  .  AMPUTATION Right 01/12/2015   Procedure: Foot transmetatarsal amputation;  Surgeon: Algernon Huxley, MD;  Location: [facility];  Service: Vascular;  Laterality: Right;  . APPLICATION OF WOUND VAC Right 03/01/2015   Procedure: Application of Bio-connekt graft and wound vac application to right foot ;  Surgeon: Algernon Huxley, MD;  Location: [facility];  Service: Vascular;  Laterality: Right;  . AV FISTULA PLACEMENT Left   . CARDIAC CATHETERIZATION     stent placement   . CORONARY ANGIOPLASTY    . DIALYSIS/PERMA CATHETER INSERTION N/A 07/31/2016   Procedure: Dialysis/Perma Catheter Insertion;  Surgeon: Algernon Huxley, MD;  Location: [place] [facility];  Service: Cardiovascular;  Laterality: N/A;  . LIGATION OF ARTERIOVENOUS  FISTULA Right 01/31/2016   Procedure: LIGATION OF ARTERIOVENOUS  FISTULA;  Surgeon: Algernon Huxley, MD;  Location: [facility];  Service: Vascular;  Laterality: Right;  . PERIPHERAL VASCULAR CATHETERIZATION Right 12/15/2014   Procedure: Lower Extremity Angiography;  Surgeon: Algernon Huxley, MD;  Location: [place] [facility];  Service: Cardiovascular;  Laterality: Right;  . PERIPHERAL VASCULAR CATHETERIZATION  12/15/2014   Procedure: Lower Extremity Intervention;  Surgeon: Algernon Huxley, MD;  Location: [place] [facility];  Service: Cardiovascular;;  . PERIPHERAL VASCULAR CATHETERIZATION Right 08/14/2015   Procedure: A/V Shuntogram/Fistulagram;  Surgeon: Algernon Huxley, MD;  Location: [place] [facility];  Service: Cardiovascular;  Laterality: Right;  . PERIPHERAL VASCULAR CATHETERIZATION N/A 08/14/2015   Procedure: A/V Shunt Intervention;  Surgeon: Algernon Huxley, MD;  Location: [place] [facility];  Service: Cardiovascular;  Laterality: N/A;  . PERIPHERAL VASCULAR CATHETERIZATION N/A 01/11/2016   Procedure: Dialysis/Perma Catheter Insertion;  Surgeon: Algernon Huxley, MD;  Location: [place] [facility];  Service: Cardiovascular;  Laterality: N/A;  . REVISON OF ARTERIOVENOUS FISTULA Right 02/17/2016   Procedure:  removal of AV fistula;  Surgeon: Serafina Mitchell, MD;  Location: [facility];  Service: Vascular;  Laterality: Right;  . REVISON OF ARTERIOVENOUS FISTULA Right 01/31/2016   Procedure: REVISON OF ARTERIOVENOUS FISTULA ( BRACHIOCEPHALIC ) W/ ARTEGRAFT;  Surgeon: Algernon Huxley, MD;  Location: [facility];  Service: Vascular;  Laterality: Right;  . TRANSMETATARSAL AMPUTATION Right 05/04/2015   Procedure: TRANSMETATARSAL AMPUTATION REVISION, great toe amputation;  Surgeon: Algernon Huxley, MD;  Location: [facility];  Service: Vascular;  Laterality: Right;   Patient Active Problem List   Diagnosis Date Noted  . Pulmonary embolism ([place]) 08/03/2016  . NSTEMI (non-ST elevated myocardial infarction) ([place]) 07/26/2016  . Acute respiratory failure with hypoxia ([place]) 05/24/2016  . Right lower lobe pneumonia ([place]) 05/24/2016  . Elevated troponin 05/24/2016  . Pulmonary edema 05/21/2016  . Kidney dialysis as the cause of abnormal reaction of the patient, or of later complication, without mention of misadventure at the time of the procedure (CODE) 03/05/2016  . PVD (peripheral vascular disease) ([place]) 03/05/2016  . Preoperative cardiovascular examination 05/02/2015  . Chronic systolic heart failure ([place])   . Sepsis ([place]) 01/13/2015  . Acute encephalopathy 01/13/2015  . Hyperkalemia 01/13/2015  . Gangrene of foot ([place]) 05/04/2014  . ESRD (end stage renal disease) on dialysis ([place]) 05/04/2014  . CAD (coronary artery disease) 05/04/2014  . Normocytic anemia 05/04/2014  . Hyperlipidemia 04/26/2010  . HYPERTENSION, BENIGN 04/26/2010  . CAD, NATIVE VESSEL 04/26/2010  . End stage renal disease ([place]) 04/26/2010      Prior to Admission medications   Medication Sig Start Date End Date Taking? Authorizing Provider  acetaminophen (TYLENOL) 500 MG tablet Take 1,000 mg by mouth every 6 (six) hours as needed for moderate pain.    Historical Provider, MD  albuterol (PROVENTIL HFA;VENTOLIN HFA) 108 (90 BASE) MCG/ACT inhaler Inhale  2 puffs into the lungs every 6 (six) hours as needed for wheezing or shortness of breath.     Historical Provider, MD  aspirin 325 MG tablet Take 325 mg by mouth every morning.     Historical Provider, MD  atorvastatin (LIPITOR) 10 MG tablet Take 10 mg by mouth at bedtime.     Historical Provider, MD  budesonide-formoterol (SYMBICORT) 160-4.5 MCG/ACT inhaler Inhale 2 puffs into the lungs 2 (two) times daily. Reported on 08/14/2015    Historical Provider, MD  calcium acetate (PHOSLO) 667 MG capsule Take 667 mg by mouth 3 (three) times daily with meals.    Historical Provider, MD  Darbepoetin Alfa (ARANESP) 40 MCG/0.4ML SOSY injection Inject 0.4 mLs (40 mcg total) into the vein every Monday with hemodialysis. 05/16/14   Shanker Kristeen Mans, MD  docusate sodium (COLACE) 100 MG capsule Take 100 mg by mouth 2 (two) times daily as needed. Reported on 08/14/2015    Historical Provider, MD  doxercalciferol (HECTOROL) 4 MCG/2ML injection Inject 1.25 mLs (2.5 mcg total) into the vein every Monday, Wednesday, and Friday with hemodialysis. 05/10/14   Shanker Kristeen Mans, MD  ferric gluconate 125 mg in sodium  chloride 0.9 % 100 mL Inject 125 mg into the vein every Monday, Wednesday, and Friday with hemodialysis. 05/10/14   Shanker Kristeen Mans, MD  fluticasone (FLONASE) 50 MCG/ACT nasal spray Place 1 spray into both nostrils daily.    Historical Provider, MD  ipratropium-albuterol (DUONEB) 0.5-2.5 (3) MG/3ML SOLN Take 3 mLs by nebulization every 4 (four) hours as needed (SOB).    Historical Provider, MD  midodrine (PROAMATINE) 5 MG tablet Take 5 mg by mouth. 3 times a week, 30 minutes before dialysis    Historical Provider, MD  Multiple Vitamins-Minerals (MULTIVITAMIN WITH MINERALS) tablet Take 1 tablet by mouth daily.    Historical Provider, MD  multivitamin (RENA-VIT) TABS tablet Take 1 tablet by mouth at bedtime. 05/10/14   Shanker Kristeen Mans, MD  Nutritional Supplements (FEEDING SUPPLEMENT, NEPRO CARB STEADY,) LIQD Take  237 mLs by mouth daily.    Historical Provider, MD  OxyCODONE HCl, Abuse Deter, (OXAYDO) 5 MG TABA Take 1 tablet by mouth every 8 (eight) hours as needed. 05/24/16   Theodoro Grist, MD  oxyCODONE-acetaminophen (PERCOCET) 7.5-325 MG tablet Take 1 tablet by mouth every 4 (four) hours as needed for severe pain. 07/31/16   Loletha Grayer, MD  pantoprazole (PROTONIX) 20 MG tablet Take 20 mg by mouth daily.    Historical Provider, MD  polyethylene glycol (MIRALAX / GLYCOLAX) packet Take 17 g by mouth every morning. Hold for loose stools.    Historical Provider, MD  pregabalin (LYRICA) 75 MG capsule Take 75 mg by mouth 2 (two) times daily.    Historical Provider, MD  sevelamer carbonate (RENVELA) 800 MG tablet Take 800 mg by mouth daily.     Historical Provider, MD  simvastatin (ZOCOR) 20 MG tablet Take 20 mg by mouth daily.    Historical Provider, MD  tiotropium (SPIRIVA) 18 MCG inhalation capsule Place 18 mcg into inhaler and inhale daily.    Historical Provider, MD  Vancomycin [facility]) 750-5 MG/150ML-% SOLN With each dialysis, Tuesday, Thursday and Saturday. Last dose April 3rd 07/31/16   Loletha Grayer, MD    Allergies Dust mite extract    Social History Social History  Substance Use Topics  . Smoking status: Light Tobacco Smoker    Packs/day: 0.25    Types: Cigarettes  . Smokeless tobacco: Never Used     Comment: 5  . Alcohol use No    Review of Systems Patient denies headaches, rhinorrhea, blurry vision, numbness, shortness of breath, chest pain, edema, cough, abdominal pain, nausea, vomiting, diarrhea, dysuria, fevers, rashes or hallucinations unless otherwise stated above in HPI. ____________________________________________   PHYSICAL EXAM:  VITAL SIGNS: Vitals:   08/03/16 2136 08/03/16 2230  BP: (!) 136/98 (!) 122/99  Pulse: 85 77  Resp: 19 13  Temp:      Constitutional: Alert and oriented. Well appearing and in no acute distress. Eyes: Conjunctivae are normal. PERRL.  EOMI. Head: Atraumatic. Nose: No congestion/rhinnorhea. Mouth/Throat: Mucous membranes are moist.  Oropharynx non-erythematous. Neck: No stridor. Painless ROM. No cervical spine tenderness to palpation Hematological/Lymphatic/Immunilogical: No cervical lymphadenopathy. Cardiovascular: Normal rate, regular rhythm. Grossly normal heart sounds.  Good peripheral circulation. Respiratory: Normal respiratory effort.  No retractions. Lungs with coarse bibasilar breath sound.  Left perm cath in place.  C/d/i Gastrointestinal: Soft and nontender. No distention. No abdominal bruits. No CVA tenderness. Genitourinary:  Musculoskeletal: s/p L BKA.  No perepheral edema Neurologic:  Normal speech and language. No gross focal neurologic deficits are appreciated.  Skin:  Skin is warm, dry and intact.  No rash noted. Psychiatric: Mood and affect are normal. Speech and behavior are normal. ____________________________________   LABS (all labs ordered are listed, but only abnormal results are displayed)  Results for orders placed or performed during the hospital encounter of 08/03/16 (from the past 24 hour(s))  Basic metabolic panel     Status: Abnormal   Collection Time: 08/03/16  4:02 PM  Result Value Ref Range   Sodium 136 135 - 145 mmol/L   Potassium 4.5 3.5 - 5.1 mmol/L   Chloride 97 (L) 101 - 111 mmol/L   CO2 26 22 - 32 mmol/L   Glucose, Bld 88 65 - 99 mg/dL   BUN 57 (H) 6 - 20 mg/dL   Creatinine, Ser 9.54 (H) 0.61 - 1.24 mg/dL   Calcium 9.3 8.9 - 10.3 mg/dL   GFR calc non Af Amer 5 (L) >60 mL/min   GFR calc Af Amer 6 (L) >60 mL/min   Anion gap 13 5 - 15  CBC     Status: Abnormal   Collection Time: 08/03/16  4:02 PM  Result Value Ref Range   WBC 9.8 3.8 - 10.6 K/uL   RBC 3.59 (L) 4.40 - 5.90 MIL/uL   Hemoglobin 11.3 (L) 13.0 - 18.0 g/dL   HCT 33.4 (L) 40.0 - 52.0 %   MCV 93.1 80.0 - 100.0 fL   MCH 31.6 26.0 - 34.0 pg   MCHC 34.0 32.0 - 36.0 g/dL   RDW 16.4 (H) 11.5 - 14.5 %   Platelets  232 150 - 440 K/uL  Troponin I     Status: Abnormal   Collection Time: 08/03/16  4:02 PM  Result Value Ref Range   Troponin I 0.21 (HH) <0.03 ng/mL  Lactic acid, plasma     Status: None   Collection Time: 08/03/16  4:46 PM  Result Value Ref Range   Lactic Acid, Venous 1.3 0.5 - 1.9 mmol/L  Hepatic function panel     Status: Abnormal   Collection Time: 08/03/16  4:57 PM  Result Value Ref Range   Total Protein 7.8 6.5 - 8.1 g/dL   Albumin 2.9 (L) 3.5 - 5.0 g/dL   AST 51 (H) 15 - 41 U/L   ALT 30 17 - 63 U/L   Alkaline Phosphatase 78 38 - 126 U/L   Total Bilirubin 0.7 0.3 - 1.2 mg/dL   Bilirubin, Direct <0.1 (L) 0.1 - 0.5 mg/dL   Indirect Bilirubin NOT CALCULATED 0.3 - 0.9 mg/dL  Protime-INR     Status: None   Collection Time: 08/03/16  4:57 PM  Result Value Ref Range   Prothrombin Time 13.3 11.4 - 15.2 seconds   INR 1.01   Brain natriuretic peptide     Status: Abnormal   Collection Time: 08/03/16  4:57 PM  Result Value Ref Range   B Natriuretic Peptide 800.0 (H) 0.0 - 100.0 pg/mL  APTT     Status: None   Collection Time: 08/03/16  4:57 PM  Result Value Ref Range   aPTT 33 24 - 36 seconds  Troponin I     Status: Abnormal   Collection Time: 08/03/16  7:54 PM  Result Value Ref Range   Troponin I 0.22 (HH) <0.03 ng/mL   ____________________________________________  EKG My review and personal interpretation at Time: 15:57   Indication: chest pain  Rate: 95  Rhythm: sinus Axis: left Other: anterolateral depressions, no STEMI ____________________________________________  RADIOLOGY  I personally reviewed all radiographic images ordered to evaluate for the above  acute complaints and reviewed radiology reports and findings.  These findings were personally discussed with the patient.  Please see medical record for radiology report.  ____________________________________________   PROCEDURES  Procedure(s) performed:  Procedures    Critical Care performed: yes CRITICAL  CARE Performed by: Merlyn Lot   Total critical care time: 40 minutes  Critical care time was exclusive of separately billable procedures and treating other patients.  Critical care was necessary to treat or prevent imminent or life-threatening deterioration.  Critical care was time spent personally by me on the following activities: development of treatment plan with patient and/or surrogate as well as nursing, discussions with consultants, evaluation of patient's response to treatment, examination of patient, obtaining history from patient or surrogate, ordering and performing treatments and interventions, ordering and review of laboratory studies, ordering and review of radiographic studies, pulse oximetry and re-evaluation of patient's condition.  ____________________________________________   INITIAL IMPRESSION / ASSESSMENT AND PLAN / ED COURSE  Pertinent labs & imaging results that were available during my care of the patient were reviewed by me and considered in my medical decision making (see chart for details).  DDX: ACS, pericarditis, esophagitis, boerhaaves, pe, dissection, pna, bronchitis, costochondritis   [name] is a 61 y.o. who presents to the ED with chest pain and shortness of breath. Patient with very complex presentation given recent hospitalization and comorbidities.  EKG was some concerning changes but consistent with previous. Troponin is elevated to 0.2 but this is actually decreased from his previous stay hospitalization where he had an and STEMI. He is currently chest pain-free but does demonstrate some shortness of breath.  Will order CT imaging to evaluate for any evidence of edema or consolidation. Will ambulate to evaluate for any hypoxia.  The patient will be placed on continuous pulse oximetry and telemetry for monitoring.  Laboratory evaluation will be sent to evaluate for the above complaints.     Clinical Course as of Aug 03 2301  Sat Aug 03, 2016  1827 PAtient ambulated and became hypoxic to 70%. With chest pain.  Will order CT chest to eval for PE.    [PR]  2012 Patient with evidence of acute PE.  Currently chest pain free.  Spoke with Dr. Jannifer Franklin who agrees to admit patient for further evaluation and management.  Have discussed with the patient and available family all diagnostics and treatments performed thus far and all questions were answered to the best of my ability. The patient demonstrates understanding and agreement with plan.   [PR]    Clinical Course User Index [PR] Merlyn Lot, MD     ____________________________________________   FINAL CLINICAL IMPRESSION(S) / ED DIAGNOSES  Final diagnoses:  Acute respiratory failure with hypoxia ([place])  Other acute pulmonary embolism with acute cor pulmonale (HCC)  Chest pain, unspecified type      NEW MEDICATIONS STARTED DURING THIS VISIT:  New Prescriptions   No medications on file     Note:  This document was prepared using Dragon voice recognition software and may include unintentional dictation errors.    Merlyn Lot, MD 08/03/16 2303

## 2016-08-03 NOTE — Progress Notes (Signed)
ANTICOAGULATION CONSULT NOTE - Initial Consult  Pharmacy Consult for Heparin  Indication: pulmonary embolus  Allergies  Allergen Reactions  . Dust Mite Extract     Patient Measurements: Height: 6\' 2"  (188 cm) Weight: 172 lb (78 kg) IBW/kg (Calculated) : 82.2 Heparin Dosing Weight: 78 kg   Vital Signs: Temp: 98.2 F (36.8 C) (03/10 1559) Temp Source: Oral (03/10 1559) BP: 149/69 (03/10 1916) Pulse Rate: 81 (03/10 1916)  Labs:  Recent Labs  08/03/16 1602 08/03/16 1657  HGB 11.3*  --   HCT 33.4*  --   PLT 232  --   LABPROT  --  13.3  INR  --  1.01  CREATININE 9.54*  --   TROPONINI 0.21*  --     Estimated Creatinine Clearance: 8.9 mL/min (by C-G formula based on SCr of 9.54 mg/dL (H)).   Medical History: Past Medical History:  Diagnosis Date  . Anemia   . Asthma   . CHF (congestive heart failure) ([place])   . Chronic systolic heart failure ([place])   . Complication of anesthesia    hypotension  . COPD (chronic obstructive pulmonary disease) ([place])   . Coronary artery disease   . Dialysis patient ([place])    Mon, Wed, Fri  . End stage renal disease ([place])   . GERD (gastroesophageal reflux disease)   . Headache   . History of kidney stones   . HLD (hyperlipidemia)   . HTN (hypertension)   . Hyperparathyroidism   . Myocardial infarction   . Peripheral vascular disease ([place])   . Shortness of breath dyspnea   . Sleep apnea   . Tobacco dependence     Medications:   (Not in a hospital admission)  Assessment: Pharmacy consulted to dose heparin for PE in this 61 year old male.  No prior anticoag noted.  CrCl = 8.9 ml/min  Goal of Therapy:  Heparin level 0.3-0.7 units/ml Monitor platelets by anticoagulation protocol: Yes   Plan:  Give 4700 units bolus x 1 Start heparin infusion at 1300 units/hr Check anti-Xa level in 8 hours and daily while on heparin Continue to monitor H&H and platelets  Yazleemar Strassner D 08/03/2016,8:14 PM

## 2016-08-03 NOTE — ED Notes (Signed)
Family leaving and left number to be called if pt is admitted Luiz Ochoa [phone]

## 2016-08-03 NOTE — ED Notes (Signed)
Pt reports chest pain that started 45 minutes ago - the pain feels like his "chest is going to explode" - at this time pt reports no pain but pain and shortness of breath walking to the car - pt denies nausea/vomiting

## 2016-08-04 ENCOUNTER — Inpatient Hospital Stay: Payer: Medicare Other

## 2016-08-04 ENCOUNTER — Inpatient Hospital Stay: Admit: 2016-08-04 | Payer: Medicare Other

## 2016-08-04 LAB — TROPONIN I
Troponin I: 0.27 ng/mL (ref <0.03)
Troponin I: 0.38 ng/mL (ref <0.03)
Troponin I: 0.75 ng/mL (ref <0.03)

## 2016-08-04 LAB — BASIC METABOLIC PANEL
Anion gap: 11 (ref 5–15)
BUN: 65 mg/dL — ABNORMAL HIGH (ref 6–20)
CHLORIDE: 100 mmol/L — AB (ref 101–111)
CO2: 25 mmol/L (ref 22–32)
Calcium: 8.7 mg/dL — ABNORMAL LOW (ref 8.9–10.3)
Creatinine, Ser: 10.83 mg/dL — ABNORMAL HIGH (ref 0.61–1.24)
GFR calc non Af Amer: 4 mL/min — ABNORMAL LOW (ref >60)
GFR, EST AFRICAN AMERICAN: 5 mL/min — AB (ref >60)
Glucose, Bld: 107 mg/dL — ABNORMAL HIGH (ref 65–99)
POTASSIUM: 4.5 mmol/L (ref 3.5–5.1)
SODIUM: 136 mmol/L (ref 135–145)

## 2016-08-04 LAB — CBC
HEMATOCRIT: 28.6 % — AB (ref 40.0–52.0)
HEMOGLOBIN: 9.5 g/dL — AB (ref 13.0–18.0)
MCH: 31.3 pg (ref 26.0–34.0)
MCHC: 33.3 g/dL (ref 32.0–36.0)
MCV: 94.1 fL (ref 80.0–100.0)
Platelets: 218 10*3/uL (ref 150–440)
RBC: 3.04 MIL/uL — AB (ref 4.40–5.90)
RDW: 16.3 % — ABNORMAL HIGH (ref 11.5–14.5)
WBC: 8 10*3/uL (ref 3.8–10.6)

## 2016-08-04 LAB — GLUCOSE, CAPILLARY: Glucose-Capillary: 71 mg/dL (ref 65–99)

## 2016-08-04 LAB — HEPARIN LEVEL (UNFRACTIONATED)
Heparin Unfractionated: 0.54 IU/mL (ref 0.30–0.70)
Heparin Unfractionated: 0.49 IU/mL (ref 0.30–0.70)

## 2016-08-04 MED ORDER — WARFARIN - PHARMACIST DOSING INPATIENT
Freq: Every day | Status: DC
  Administered 2016-08-04 – 2016-08-05 (×2)

## 2016-08-04 MED ORDER — EPOETIN ALFA 10000 UNIT/ML IJ SOLN
4000.0000 [IU] | INTRAMUSCULAR | Status: DC
  Administered 2016-08-04 – 2016-08-07 (×3): 4000 [IU] via INTRAVENOUS
  Filled 2016-08-04: qty 1

## 2016-08-04 MED ORDER — WARFARIN SODIUM 5 MG PO TABS
5.0000 mg | ORAL_TABLET | Freq: Once | ORAL | Status: AC
  Administered 2016-08-04: 5 mg via ORAL
  Filled 2016-08-04 (×2): qty 1

## 2016-08-04 NOTE — Progress Notes (Signed)
POST DIALYSIS ASSESSMENT 

## 2016-08-04 NOTE — Progress Notes (Addendum)
[place] [facility] [place] NAME: [name]    MR#:  [id number]  DATE OF BIRTH:  [birth year]  SUBJECTIVE:  Seen at HD  REVIEW OF SYSTEMS:   Review of Systems  Constitutional: Negative for chills, fever and weight loss.  HENT: Negative for ear discharge, ear pain and nosebleeds.   Eyes: Negative for blurred vision, pain and discharge.  Respiratory: Negative for sputum production, shortness of breath, wheezing and stridor.   Cardiovascular: Negative for chest pain, palpitations, orthopnea and PND.  Gastrointestinal: Negative for abdominal pain, diarrhea, nausea and vomiting.  Genitourinary: Negative for frequency and urgency.  Musculoskeletal: Negative for back pain and joint pain.  Neurological: Positive for weakness. Negative for sensory change, speech change and focal weakness.  Psychiatric/Behavioral: Negative for depression and hallucinations. The patient is not nervous/anxious.    Tolerating Diet:yes Tolerating PT: pending  DRUG ALLERGIES:   Allergies  Allergen Reactions  . Dust Mite Extract     VITALS:  Blood pressure (!) 118/54, pulse 78, temperature 98 F (36.7 C), temperature source Oral, resp. rate 18, height 6\' 2"  (1.88 m), weight 84.3 kg (185 lb 13.6 oz), SpO2 98 %.  PHYSICAL EXAMINATION:   Physical Exam  GENERAL:  61 y.o.-year-old patient lying in the bed with no acute distress.  EYES: Pupils equal, round, reactive to light and accommodation. No scleral icterus. Extraocular muscles intact.  HEENT: Head atraumatic, normocephalic. Oropharynx and nasopharynx clear.  NECK:  Supple, no jugular venous distention. No thyroid enlargement, no tenderness.  LUNGS: Normal breath sounds bilaterally, no wheezing, rales, rhonchi. No use of accessory muscles of respiration.  CARDIOVASCULAR: S1, S2 normal. No murmurs, rubs, or gallops.  ABDOMEN: Soft, nontender, nondistended. Bowel sounds present. No organomegaly or mass.   EXTREMITIES: No cyanosis, clubbing or edema b/l.    NEUROLOGIC: Cranial nerves II through XII are intact. No focal Motor or sensory deficits b/l.   PSYCHIATRIC:  patient is alert and oriented x 3.  SKIN: No obvious rash, lesion, or ulcer.   LABORATORY PANEL:  CBC  Recent Labs Lab 08/04/16 0456  WBC 8.0  HGB 9.5*  HCT 28.6*  PLT 218    Chemistries   Recent Labs Lab 08/03/16 1657 08/04/16 0456  NA  --  136  K  --  4.5  CL  --  100*  CO2  --  25  GLUCOSE  --  107*  BUN  --  65*  CREATININE  --  10.83*  CALCIUM  --  8.7*  AST 51*  --   ALT 30  --   ALKPHOS 78  --   BILITOT 0.7  --    Cardiac Enzymes  Recent Labs Lab 08/04/16 1115  TROPONINI 0.75*   RADIOLOGY:  Dg Chest 2 View  Result Date: 08/03/2016 CLINICAL DATA:  Acute chest pain and shortness of breath. EXAM: CHEST  2 VIEW COMPARISON:  07/26/2016 and prior radiograph FINDINGS: Cardiomegaly and left central venous catheter with tip overlying the superior cavoatrial junction noted. A small right pleural effusion and right basilar atelectasis/scarring again noted. There is no evidence of pulmonary edema, left pleural effusion or pneumothorax. IMPRESSION: Cardiomegaly without evidence of acute cardiopulmonary disease. Small right pleural effusion and right basilar atelectasis/scarring again noted. Electronically Signed   By: Margarette Canada M.D.   On: 08/03/2016 16:59   Ct Angio Chest Pe W And/or Wo Contrast  Result Date: 08/03/2016 CLINICAL DATA:  61 y.o. male with chest pain and shortness of  breath. History of end-stage renal disease and CHF. EXAM: CT ANGIOGRAPHY CHEST WITH CONTRAST TECHNIQUE: Multidetector CT imaging of the chest was performed using the standard protocol during bolus administration of intravenous contrast. Multiplanar CT image reconstructions and MIPs were obtained to evaluate the vascular anatomy. CONTRAST:  75 cc intravenous Isovue 370 COMPARISON:  08/03/2016 and prior chest radiographs.  07/06/2013 chest CT FINDINGS: Cardiovascular: Pulmonary emboli within the right upper lobe noted. RV/LV ratio is 0.72. Cardiomegaly and moderate to heavy coronary artery calcifications again noted. Ectasia of the thoracic aorta noted without aneurysm. No pericardial effusion identified. A right subclavian stent is noted. A left IJ central venous catheter with tip at the superior cavoatrial junction noted. Mediastinum/Nodes: No mediastinal mass or enlarged lymph nodes. Lungs/Pleura: A small right pleural effusion noted with right lower lobe atelectasis/ scarring. No airspace disease or mass identified. There is no evidence of pneumothorax. Upper Abdomen: No acute abnormality. Musculoskeletal: No acute or suspicious focal abnormalities noted. Review of the MIP images confirms the above findings. IMPRESSION: Right upper lobe pulmonary emboli. No CT evidence of right heart strain. Small right pleural effusion and right lower lobe atelectasis/ scarring. Cardiomegaly and coronary artery disease. Critical Value/emergent results were called by telephone at the time of interpretation on 08/03/2016 at 7:52 pm to Dr. Merlyn Lot , who verbally acknowledged these results. Electronically Signed   By: Margarette Canada M.D.   On: 08/03/2016 19:56   ASSESSMENT AND PLAN:  [name]  is a 61 y.o. male who presents with Acute onset of chest pain today. He had associated shortness of breath. Here in the ED he was found on CT imaging to have a pulmonary embolus. IV heparin was started   * Acute right Pulmonary embolism (HCC) - IV heparin. CT imaging showed no evidence of right heart strain.  -IV heparin gtt -hgb stable at 9.9 -start po coumadin -[place] LE pending  *  HYPERTENSION, BENIGN - stable, continue home meds   * CAD, NATIVE VESSEL - patient's troponin was elevated, likely related to his pulmonary embolus. However, he has not had cardiac workup for some time so we will get a cardiology consult.  *  ESRD (end stage  renal disease) on dialysis [facility]) - nephrology consult for hemodialysis support   * Chronic systolic heart failure ([place]) - continue home meds   * Hyperlipidemia - continue home meds  Case discussed with Care Management/Social Worker. Management plans discussed with the patient, family and they are in agreement.  CODE STATUS: full  DVT Prophylaxis:coumadin TOTAL TIME TAKING CARE OF THIS PATIENT: 30 minutes.  >50% time spent on counselling and coordination of caredr singh POSSIBLE D/C IN 1-2 DAYS, DEPENDING ON CLINICAL CONDITION.  Note: This dictation was prepared with Dragon dictation along with smaller phrase technology. Any transcriptional errors that result from this process are unintentional.  Roy Tokarz M.D on 08/04/2016 at 3:03 PM  Between 7am to 6pm - Pager - [phone]  After 6pm go to [email or web address] - password [email or web address] [facility]  Office  [phone]  CC: Primary care physician; [place]

## 2016-08-04 NOTE — Progress Notes (Signed)
Pre dialysis assessment 

## 2016-08-04 NOTE — Progress Notes (Signed)
This note also relates to the following rows which could not be included: BP - Cannot attach notes to unvalidated device data  HD STARTED

## 2016-08-04 NOTE — Progress Notes (Signed)
[facility]  ROUNDING NOTE   Subjective:   Patient returns for centralized chest pain, SOB and diaphoresis CT of chest with iv contrast showed Pulm Embolism  Admitted for further management Patient seen during dialysis Tolerating well    HEMODIALYSIS FLOWSHEET:  Blood Flow Rate (mL/min): 400 mL/min Arterial Pressure (mmHg): -210 mmHg Venous Pressure (mmHg): 180 mmHg Transmembrane Pressure (mmHg): 50 mmHg Ultrafiltration Rate (mL/min): 0 mL/min Dialysate Flow Rate (mL/min): 800 ml/min Conductivity: Machine : 13.9 Conductivity: Machine : 13.9 Dialysis Fluid Bolus: Normal Saline Bolus Amount (mL): 250 mL Intra-Hemodialysis Comments: 750ml (resting with eyes closed)     Objective:  Vital signs in last 24 hours:  Temp:  [97.9 F (36.6 C)-98.2 F (36.8 C)] 97.9 F (36.6 C) (03/11 1100) Pulse Rate:  [42-100] 85 (03/11 1230) Resp:  [13-20] 18 (03/11 1230) BP: (84-149)/(57-99) 93/61 (03/11 1230) SpO2:  [74 %-100 %] 98 % (03/11 1100) FiO2 (%):  [21 %] 21 % (03/10 1916) Weight:  [78 kg (172 lb)-85.6 kg (188 lb 11.4 oz)] 85.6 kg (188 lb 11.4 oz) (03/11 1100)  Weight change:  Filed Weights   08/03/16 2322 08/04/16 0500 08/04/16 1100  Weight: 83.4 kg (183 lb 14.4 oz) 83.1 kg (183 lb 1.6 oz) 85.6 kg (188 lb 11.4 oz)    Intake/Output: I/O last 3 completed shifts: In: 138.9 [I.V.:138.9] Out: -    Intake/Output this shift:  No intake/output data recorded.  Physical Exam: General: Laying in bed  Head: Normocephalic, atraumatic. Moist oral mucosal membranes  Eyes: Anicteric,   Neck: Supple, trachea midline  Lungs:  Clear to auscultation  Heart: Regular rate and rhythm  Abdomen:  Soft, nontender  Extremities: Left BKA, right toe amputations  Neurologic: Nonfocal, moving all four extremities  Skin: No lesions  Access: IJ PC    Basic Metabolic Panel:  Recent Labs Lab 07/29/16 1649 07/30/16 1109 08/03/16 1602 08/04/16 0456  NA 137 137 136 136  K  6.0* 4.9 4.5 4.5  CL 96* 97* 97* 100*  CO2 24 29 26 25   GLUCOSE 93 99 88 107*  BUN 120* 60* 57* 65*  CREATININE 15.92* 9.70* 9.54* 10.83*  CALCIUM 8.3* 8.7* 9.3 8.7*  PHOS 10.0* 7.5*  --   --     Liver Function Tests:  Recent Labs Lab 07/29/16 1649 07/30/16 1109 08/03/16 1657  AST  --   --  51*  ALT  --   --  30  ALKPHOS  --   --  78  BILITOT  --   --  0.7  PROT  --   --  7.8  ALBUMIN 2.5* 2.4* 2.9*   No results for input(s): LIPASE, AMYLASE in the last 168 hours. No results for input(s): AMMONIA in the last 168 hours.  CBC:  Recent Labs Lab 07/29/16 1649 07/30/16 0451 07/30/16 1030 08/03/16 1602 08/04/16 0456  WBC 6.2 5.6 5.8 9.8 8.0  HGB 10.2* 10.5* 10.4* 11.3* 9.5*  HCT 30.5* 31.2* 31.2* 33.4* 28.6*  MCV 92.8 92.9 93.4 93.1 94.1  PLT 114* 120* 126* 232 218    Cardiac Enzymes:  Recent Labs Lab 08/03/16 1602 08/03/16 1954 08/04/16 0009 08/04/16 0456 08/04/16 1115  TROPONINI 0.21* 0.22* 0.27* 0.38* 0.75*    BNP: Invalid input(s): POCBNP  CBG:  Recent Labs Lab 07/30/16 0743 07/30/16 1633 07/30/16 2135 07/31/16 0733 07/31/16 1121  GLUCAP 102* 113* 107* 99 10    Microbiology: Results for orders placed or performed during the hospital encounter of 07/26/16  Blood Culture (  routine x 2)     Status: Abnormal   Collection Time: 07/26/16  8:08 AM  Result Value Ref Range Status   Specimen Description BLOOD LEFT ANTECUBITAL  Final   Special Requests BOTTLES DRAWN AEROBIC AND ANAEROBIC BCAV  Final   Culture  Setup Time   Final    GRAM POSITIVE COCCI ANAEROBIC BOTTLE ONLY CRITICAL VALUE NOTED.  VALUE IS CONSISTENT WITH PREVIOUSLY REPORTED AND CALLED VALUE.    Culture (A)  Final    STAPHYLOCOCCUS AUREUS SUSCEPTIBILITIES PERFORMED ON PREVIOUS CULTURE WITHIN THE LAST 5 DAYS. Performed at [facility], [place] [address] [place], [place] [address]    Report Status 07/29/2016 FINAL  Final  Blood Culture (routine x 2)     Status: Abnormal    Collection Time: 07/26/16  9:57 AM  Result Value Ref Range Status   Specimen Description BLOOD LEFT ARM  Final   Special Requests BOTTLES DRAWN AEROBIC AND ANAEROBIC BCHV  Final   Culture  Setup Time   Final    GRAM POSITIVE COCCI AEROBIC BOTTLE ONLY CRITICAL RESULT CALLED TO, READ BACK BY AND VERIFIED WITH: [name] COOKSON AT 2325 07/26/16.PMH Performed at [facility], [place] [address], [place],  [address]    Culture METHICILLIN RESISTANT STAPHYLOCOCCUS AUREUS (A)  Final   Report Status 07/29/2016 FINAL  Final   Organism ID, Bacteria METHICILLIN RESISTANT STAPHYLOCOCCUS AUREUS  Final      Susceptibility   Methicillin resistant staphylococcus aureus - MIC*    CIPROFLOXACIN >=8 RESISTANT Resistant     ERYTHROMYCIN >=8 RESISTANT Resistant     GENTAMICIN <=0.5 SENSITIVE Sensitive     OXACILLIN >=4 RESISTANT Resistant     TETRACYCLINE <=1 SENSITIVE Sensitive     VANCOMYCIN 1 SENSITIVE Sensitive     TRIMETH/SULFA <=10 SENSITIVE Sensitive     CLINDAMYCIN RESISTANT Resistant     RIFAMPIN <=0.5 SENSITIVE Sensitive     Inducible Clindamycin POSITIVE Resistant     * METHICILLIN RESISTANT STAPHYLOCOCCUS AUREUS  Blood Culture ID Panel (Reflexed)     Status: Abnormal   Collection Time: 07/26/16  9:57 AM  Result Value Ref Range Status   Enterococcus species NOT DETECTED NOT DETECTED Final   Listeria monocytogenes NOT DETECTED NOT DETECTED Final   Staphylococcus species DETECTED (A) NOT DETECTED Final    Comment: CRITICAL RESULT CALLED TO, READ BACK BY AND VERIFIED WITH: [name] COOKSON AT 2325 07/26/16.PMH    Staphylococcus aureus DETECTED (A) NOT DETECTED Final    Comment: Methicillin (oxacillin)-resistant Staphylococcus aureus (MRSA). MRSA is predictably resistant to beta-lactam antibiotics (except ceftaroline). Preferred therapy is vancomycin unless clinically contraindicated. Patient requires contact precautions if  hospitalized. CRITICAL RESULT CALLED TO, READ BACK BY AND VERIFIED  WITH: [name] COOKSON AT 2325 07/26/16.PMH    Methicillin resistance DETECTED (A) NOT DETECTED Final    Comment: CRITICAL RESULT CALLED TO, READ BACK BY AND VERIFIED WITH: [name] COOKSON AT 2325 07/26/16.PMH    Streptococcus species NOT DETECTED NOT DETECTED Final   Streptococcus agalactiae NOT DETECTED NOT DETECTED Final   Streptococcus pneumoniae NOT DETECTED NOT DETECTED Final   Streptococcus pyogenes NOT DETECTED NOT DETECTED Final   Acinetobacter baumannii NOT DETECTED NOT DETECTED Final   Enterobacteriaceae species NOT DETECTED NOT DETECTED Final   Enterobacter cloacae complex NOT DETECTED NOT DETECTED Final   Escherichia coli NOT DETECTED NOT DETECTED Final   Klebsiella oxytoca NOT DETECTED NOT DETECTED Final   Klebsiella pneumoniae NOT DETECTED NOT DETECTED Final   Proteus species NOT  DETECTED NOT DETECTED Final   Serratia marcescens NOT DETECTED NOT DETECTED Final   Haemophilus influenzae NOT DETECTED NOT DETECTED Final   Neisseria meningitidis NOT DETECTED NOT DETECTED Final   Pseudomonas aeruginosa NOT DETECTED NOT DETECTED Final   Candida albicans NOT DETECTED NOT DETECTED Final   Candida glabrata NOT DETECTED NOT DETECTED Final   Candida krusei NOT DETECTED NOT DETECTED Final   Candida parapsilosis NOT DETECTED NOT DETECTED Final   Candida tropicalis NOT DETECTED NOT DETECTED Final  MRSA PCR Screening     Status: None   Collection Time: 07/27/16  1:10 AM  Result Value Ref Range Status   MRSA by PCR NEGATIVE NEGATIVE Final    Comment:        The GeneXpert MRSA Assay (FDA approved for NASAL specimens only), is one component of a comprehensive MRSA colonization surveillance program. It is not intended to diagnose MRSA infection nor to guide or monitor treatment for MRSA infections.   Cath Tip Culture     Status: Abnormal   Collection Time: 07/27/16 12:07 PM  Result Value Ref Range Status   Specimen Description CATH TIP  Final   Special Requests NONE  Final   Culture  (A)  Final    >=100,000 COLONIES/mL METHICILLIN RESISTANT STAPHYLOCOCCUS AUREUS   Report Status 07/30/2016 FINAL  Final   Organism ID, Bacteria METHICILLIN RESISTANT STAPHYLOCOCCUS AUREUS (A)  Final      Susceptibility   Methicillin resistant staphylococcus aureus - MIC*    CIPROFLOXACIN >=8 RESISTANT Resistant     ERYTHROMYCIN >=8 RESISTANT Resistant     GENTAMICIN <=0.5 SENSITIVE Sensitive     OXACILLIN >=4 RESISTANT Resistant     TETRACYCLINE <=1 SENSITIVE Sensitive     VANCOMYCIN 1 SENSITIVE Sensitive     TRIMETH/SULFA <=10 SENSITIVE Sensitive     CLINDAMYCIN RESISTANT Resistant     RIFAMPIN <=0.5 SENSITIVE Sensitive     Inducible Clindamycin POSITIVE Resistant     * >=100,000 COLONIES/mL METHICILLIN RESISTANT STAPHYLOCOCCUS AUREUS  CULTURE, BLOOD (ROUTINE X 2) w Reflex to ID Panel     Status: None   Collection Time: 07/28/16  1:59 PM  Result Value Ref Range Status   Specimen Description BLOOD  LEFT HAND  Final   Special Requests BOTTLES DRAWN AEROBIC AND ANAEROBIC  BCAV  Final   Culture NO GROWTH 5 DAYS  Final   Report Status 08/02/2016 FINAL  Final  CULTURE, BLOOD (ROUTINE X 2) w Reflex to ID Panel     Status: None   Collection Time: 07/28/16  2:10 PM  Result Value Ref Range Status   Specimen Description BLOOD  LEFT WRIST  Final   Special Requests BOTTLES DRAWN AEROBIC AND ANAEROBIC  BCAV  Final   Culture NO GROWTH 5 DAYS  Final   Report Status 08/02/2016 FINAL  Final    Coagulation Studies:  Recent Labs  08/03/16 1657  LABPROT 13.3  INR 1.01    Urinalysis: No results for input(s): COLORURINE, LABSPEC, PHURINE, GLUCOSEU, HGBUR, BILIRUBINUR, KETONESUR, PROTEINUR, UROBILINOGEN, NITRITE, LEUKOCYTESUR in the last 72 hours.  Invalid input(s): APPERANCEUR    Imaging: Dg Chest 2 View  Result Date: 08/03/2016 CLINICAL DATA:  Acute chest pain and shortness of breath. EXAM: CHEST  2 VIEW COMPARISON:  07/26/2016 and prior radiograph FINDINGS: Cardiomegaly and left  central venous catheter with tip overlying the superior cavoatrial junction noted. A small right pleural effusion and right basilar atelectasis/scarring again noted. There is no evidence of pulmonary edema, left pleural effusion  or pneumothorax. IMPRESSION: Cardiomegaly without evidence of acute cardiopulmonary disease. Small right pleural effusion and right basilar atelectasis/scarring again noted. Electronically Signed   By: Margarette Canada M.D.   On: 08/03/2016 16:59   Ct Angio Chest Pe W And/or Wo Contrast  Result Date: 08/03/2016 CLINICAL DATA:  61 year old male with chest pain and shortness of breath. History of end-stage renal disease and CHF. EXAM: CT ANGIOGRAPHY CHEST WITH CONTRAST TECHNIQUE: Multidetector CT imaging of the chest was performed using the standard protocol during bolus administration of intravenous contrast. Multiplanar CT image reconstructions and MIPs were obtained to evaluate the vascular anatomy. CONTRAST:  75 cc intravenous Isovue 370 COMPARISON:  08/03/2016 and prior chest radiographs. 07/06/2013 chest CT FINDINGS: Cardiovascular: Pulmonary emboli within the right upper lobe noted. RV/LV ratio is 0.72. Cardiomegaly and moderate to heavy coronary artery calcifications again noted. Ectasia of the thoracic aorta noted without aneurysm. No pericardial effusion identified. A right subclavian stent is noted. A left IJ central venous catheter with tip at the superior cavoatrial junction noted. Mediastinum/Nodes: No mediastinal mass or enlarged lymph nodes. Lungs/Pleura: A small right pleural effusion noted with right lower lobe atelectasis/ scarring. No airspace disease or mass identified. There is no evidence of pneumothorax. Upper Abdomen: No acute abnormality. Musculoskeletal: No acute or suspicious focal abnormalities noted. Review of the MIP images confirms the above findings. IMPRESSION: Right upper lobe pulmonary emboli. No CT evidence of right heart strain. Small right pleural effusion  and right lower lobe atelectasis/ scarring. Cardiomegaly and coronary artery disease. Critical Value/emergent results were called by telephone at the time of interpretation on 08/03/2016 at 7:52 pm to Dr. Merlyn Lot , who verbally acknowledged these results. Electronically Signed   By: Margarette Canada M.D.   On: 08/03/2016 19:56     Medications:   . heparin 1,300 Units/hr (08/03/16 2019)   . aspirin  325 mg Oral [id number]  . atorvastatin  10 mg Oral QHS  . calcium acetate  667 mg Oral TID WC  . mometasone-formoterol  2 puff Inhalation BID  . pantoprazole  40 mg Oral Daily  . pregabalin  75 mg Oral BID  . sevelamer carbonate  800 mg Oral Daily  . tiotropium  18 mcg Inhalation Daily  . warfarin  5 mg Oral ONCE-1800  . Warfarin - Pharmacist Dosing Inpatient   Does not apply q1800     Assessment/ Plan:  [name] is a 61 y.o. black male with ESRD on hemodialysis, hypertension, hyperlipidemia, anemia, COPD, peripheral vascular disease, left BKA, right toe amputations  MWF [facility].   1.  End stage renal disease -  Complication of dialysis device. Tunnel infection. Catheter removed 3/3,   - vancomycin for MRSA in blood - VANC 750 mg iv each treatment called in to his dialysis unit x 4 weeks (Last dose 08/27/16)  2.  Anemia of chronic kidney disease: hemoglobin 9.5 - EPO with HD  3. Hypertension: blood pressure at goal.  - midodrine before dialysis.   4. Secondary Hyperparathyroidism: - calcium acetate with meals.   5. Pulm Embolism - heparin, coumadin - LE dopplers ordered to look for source Patient had Femoral dialysis cathter last week     LOS: 1 Beulah Matusek 3/11/201812:56 PM

## 2016-08-04 NOTE — Progress Notes (Addendum)
ANTICOAGULATION CONSULT NOTE - Initial Consult  Pharmacy Consult for Heparin  Indication: pulmonary embolus  Allergies  Allergen Reactions  . Dust Mite Extract     Patient Measurements: Height: 6\' 2"  (188 cm) Weight: 183 lb 14.4 oz (83.4 kg) IBW/kg (Calculated) : 82.2 Heparin Dosing Weight: 78 kg   Vital Signs: Temp: 98.2 F (36.8 C) (03/10 2322) Temp Source: Oral (03/10 2322) BP: 119/80 (03/10 2322) Pulse Rate: 82 (03/10 2322)  Labs:  Recent Labs  08/03/16 1602 08/03/16 1657 08/03/16 1954 08/04/16 0009 08/04/16 0456  HGB 11.3*  --   --   --  9.5*  HCT 33.4*  --   --   --  28.6*  PLT 232  --   --   --  218  APTT  --  33  --   --   --   LABPROT  --  13.3  --   --   --   INR  --  1.01  --   --   --   HEPARINUNFRC  --   --   --   --  0.54  CREATININE 9.54*  --   --   --  10.83*  TROPONINI 0.21*  --  0.22* 0.27*  --     Estimated Creatinine Clearance: 8.2 mL/min (by C-G formula based on SCr of 10.83 mg/dL (H)).   Medical History: Past Medical History:  Diagnosis Date  . Anemia   . Asthma   . CHF (congestive heart failure) ([place])   . Chronic systolic heart failure ([place])   . Complication of anesthesia    hypotension  . COPD (chronic obstructive pulmonary disease) ([place])   . Coronary artery disease   . Dialysis patient ([place])    Mon, Wed, Fri  . End stage renal disease ([place])   . GERD (gastroesophageal reflux disease)   . Headache   . History of kidney stones   . HLD (hyperlipidemia)   . HTN (hypertension)   . Hyperparathyroidism   . Myocardial infarction   . Peripheral vascular disease ([place])   . Shortness of breath dyspnea   . Sleep apnea   . Tobacco dependence     Medications:  Prescriptions Prior to Admission  Medication Sig Dispense Refill Last Dose  . acetaminophen (TYLENOL) 500 MG tablet Take 1,000 mg by mouth every 6 (six) hours as needed for moderate pain.   prn at prn  . albuterol (PROVENTIL HFA;VENTOLIN HFA) 108 (90 BASE) MCG/ACT inhaler  Inhale 2 puffs into the lungs every 6 (six) hours as needed for wheezing or shortness of breath.    prn at prn  . aspirin 325 MG tablet Take 325 mg by mouth every morning.    Past Week at 0800  . atorvastatin (LIPITOR) 10 MG tablet Take 10 mg by mouth at bedtime.    07/25/2016 at 2000  . budesonide-formoterol (SYMBICORT) 160-4.5 MCG/ACT inhaler Inhale 2 puffs into the lungs 2 (two) times daily. Reported on 08/14/2015   07/25/2016 at 2000  . calcium acetate (PHOSLO) 667 MG capsule Take 667 mg by mouth 3 (three) times daily with meals.   07/25/2016 at 2000  . Darbepoetin Alfa (ARANESP) 40 MCG/0.4ML SOSY injection Inject 0.4 mLs (40 mcg total) into the vein every Monday with hemodialysis. 8.4 mL  07/26/2016 at Unknown time  . docusate sodium (COLACE) 100 MG capsule Take 100 mg by mouth 2 (two) times daily as needed. Reported on 08/14/2015   prn at prn  . doxercalciferol (HECTOROL) 4  MCG/2ML injection Inject 1.25 mLs (2.5 mcg total) into the vein every Monday, Wednesday, and Friday with hemodialysis. 2 mL  07/26/2016 at Unknown time  . ferric gluconate 125 mg in sodium chloride 0.9 % 100 mL Inject 125 mg into the vein every Monday, Wednesday, and Friday with hemodialysis.   07/26/2016 at Unknown time  . fluticasone (FLONASE) 50 MCG/ACT nasal spray Place 1 spray into both nostrils daily.   Past Week at 0800  . ipratropium-albuterol (DUONEB) 0.5-2.5 (3) MG/3ML SOLN Take 3 mLs by nebulization every 4 (four) hours as needed (SOB).   prn at prn  . midodrine (PROAMATINE) 5 MG tablet Take 5 mg by mouth. 3 times a week, 30 minutes before dialysis   Past Week at Unknown time  . Multiple Vitamins-Minerals (MULTIVITAMIN WITH MINERALS) tablet Take 1 tablet by mouth daily.   07/25/2016 at 2000  . multivitamin (RENA-VIT) TABS tablet Take 1 tablet by mouth at bedtime.  0 Past Week at Unknown time  . Nutritional Supplements (FEEDING SUPPLEMENT, NEPRO CARB STEADY,) LIQD Take 237 mLs by mouth daily.   07/24/2016 at Unknown time  .  OxyCODONE HCl, Abuse Deter, (OXAYDO) 5 MG TABA Take 1 tablet by mouth every 8 (eight) hours as needed. 30 tablet 0 07/25/2016 at prn  . oxyCODONE-acetaminophen (PERCOCET) 7.5-325 MG tablet Take 1 tablet by mouth every 4 (four) hours as needed for severe pain. 12 tablet 0   . pantoprazole (PROTONIX) 20 MG tablet Take 20 mg by mouth daily.   07/26/2016 at 0600  . polyethylene glycol (MIRALAX / GLYCOLAX) packet Take 17 g by mouth every morning. Hold for loose stools.   Past Week at 0800  . pregabalin (LYRICA) 75 MG capsule Take 75 mg by mouth 2 (two) times daily.   07/25/2016 at 1600  . sevelamer carbonate (RENVELA) 800 MG tablet Take 800 mg by mouth daily.    Past Week at Unknown time  . simvastatin (ZOCOR) 20 MG tablet Take 20 mg by mouth daily.   07/25/2016 at 2000  . tiotropium (SPIRIVA) 18 MCG inhalation capsule Place 18 mcg into inhaler and inhale daily.   07/25/2016 at 0800  . Vancomycin (VANCOCIN) 750-5 MG/150ML-% SOLN With each dialysis, Tuesday, Thursday and Saturday. Last dose April 3rd 4000 mL      Assessment: Pharmacy consulted to dose heparin for PE in this 61 year old male.  No prior anticoag noted.  CrCl = 8.9 ml/min  Goal of Therapy:  Heparin level 0.3-0.7 units/ml Monitor platelets by anticoagulation protocol: Yes   Plan:  Give 4700 units bolus x 1 Start heparin infusion at 1300 units/hr Check anti-Xa level in 8 hours and daily while on heparin Continue to monitor H&H and platelets   3/11 05:00 heparin level 0.54. Recheck in 8 hours to confirm.  Ellanore Vanhook S 08/04/2016,5:48 AM

## 2016-08-04 NOTE — Progress Notes (Signed)
HD COMPLETED  

## 2016-08-04 NOTE — Progress Notes (Signed)
ANTICOAGULATION CONSULT NOTE - Initial Consult  Pharmacy Consult for Heparin  Indication: pulmonary embolus  Allergies  Allergen Reactions  . Dust Mite Extract     Patient Measurements: Height: 6\' 2"  (188 cm) Weight: 188 lb 11.4 oz (85.6 kg) IBW/kg (Calculated) : 82.2 Heparin Dosing Weight: 78 kg   Vital Signs: Temp: 97.9 F (36.6 C) (03/11 1100) Temp Source: Oral (03/11 1100) BP: 92/68 (03/11 1300) Pulse Rate: 85 (03/11 1300)  Labs:  Recent Labs  08/03/16 1602 08/03/16 1657  08/04/16 0009 08/04/16 0456 08/04/16 1115 08/04/16 1257  HGB 11.3*  --   --   --  9.5*  --   --   HCT 33.4*  --   --   --  28.6*  --   --   PLT 232  --   --   --  218  --   --   APTT  --  33  --   --   --   --   --   LABPROT  --  13.3  --   --   --   --   --   INR  --  1.01  --   --   --   --   --   HEPARINUNFRC  --   --   --   --  0.54  --  0.49  CREATININE 9.54*  --   --   --  10.83*  --   --   TROPONINI 0.21*  --   < > 0.27* 0.38* 0.75*  --   < > = values in this interval not displayed.  Estimated Creatinine Clearance: 8.2 mL/min (by C-G formula based on SCr of 10.83 mg/dL (H)).   Medical History: Past Medical History:  Diagnosis Date  . Anemia   . Asthma   . CHF (congestive heart failure) ([place])   . Chronic systolic heart failure ([place])   . Complication of anesthesia    hypotension  . COPD (chronic obstructive pulmonary disease) ([place])   . Coronary artery disease   . Dialysis patient ([place])    Mon, Wed, Fri  . End stage renal disease ([place])   . GERD (gastroesophageal reflux disease)   . Headache   . History of kidney stones   . HLD (hyperlipidemia)   . HTN (hypertension)   . Hyperparathyroidism   . Myocardial infarction   . Peripheral vascular disease ([place])   . Shortness of breath dyspnea   . Sleep apnea   . Tobacco dependence     Medications:  Prescriptions Prior to Admission  Medication Sig Dispense Refill Last Dose  . acetaminophen (TYLENOL) 500 MG tablet Take 1,000  mg by mouth every 6 (six) hours as needed for moderate pain.   prn at prn  . albuterol (PROVENTIL HFA;VENTOLIN HFA) 108 (90 BASE) MCG/ACT inhaler Inhale 2 puffs into the lungs every 6 (six) hours as needed for wheezing or shortness of breath.    prn at prn  . aspirin 325 MG tablet Take 325 mg by mouth every morning.    Past Week at 0800  . atorvastatin (LIPITOR) 10 MG tablet Take 10 mg by mouth at bedtime.    07/25/2016 at 2000  . budesonide-formoterol (SYMBICORT) 160-4.5 MCG/ACT inhaler Inhale 2 puffs into the lungs 2 (two) times daily. Reported on 08/14/2015   07/25/2016 at 2000  . calcium acetate (PHOSLO) 667 MG capsule Take 667 mg by mouth 3 (three) times daily with meals.   07/25/2016 at 2000  .  Darbepoetin Alfa (ARANESP) 40 MCG/0.4ML SOSY injection Inject 0.4 mLs (40 mcg total) into the vein every Monday with hemodialysis. 8.4 mL  07/26/2016 at Unknown time  . docusate sodium (COLACE) 100 MG capsule Take 100 mg by mouth 2 (two) times daily as needed. Reported on 08/14/2015   prn at prn  . doxercalciferol (HECTOROL) 4 MCG/2ML injection Inject 1.25 mLs (2.5 mcg total) into the vein every Monday, Wednesday, and Friday with hemodialysis. 2 mL  07/26/2016 at Unknown time  . ferric gluconate 125 mg in sodium chloride 0.9 % 100 mL Inject 125 mg into the vein every Monday, Wednesday, and Friday with hemodialysis.   07/26/2016 at Unknown time  . fluticasone (FLONASE) 50 MCG/ACT nasal spray Place 1 spray into both nostrils daily.   Past Week at 0800  . ipratropium-albuterol (DUONEB) 0.5-2.5 (3) MG/3ML SOLN Take 3 mLs by nebulization every 4 (four) hours as needed (SOB).   prn at prn  . midodrine (PROAMATINE) 5 MG tablet Take 5 mg by mouth. 3 times a week, 30 minutes before dialysis   Past Week at Unknown time  . Multiple Vitamins-Minerals (MULTIVITAMIN WITH MINERALS) tablet Take 1 tablet by mouth daily.   07/25/2016 at 2000  . multivitamin (RENA-VIT) TABS tablet Take 1 tablet by mouth at bedtime.  0 Past Week at Unknown  time  . Nutritional Supplements (FEEDING SUPPLEMENT, NEPRO CARB STEADY,) LIQD Take 237 mLs by mouth daily.   07/24/2016 at Unknown time  . OxyCODONE HCl, Abuse Deter, (OXAYDO) 5 MG TABA Take 1 tablet by mouth every 8 (eight) hours as needed. 30 tablet 0 07/25/2016 at prn  . oxyCODONE-acetaminophen (PERCOCET) 7.5-325 MG tablet Take 1 tablet by mouth every 4 (four) hours as needed for severe pain. 12 tablet 0   . pantoprazole (PROTONIX) 20 MG tablet Take 20 mg by mouth daily.   07/26/2016 at 0600  . polyethylene glycol (MIRALAX / GLYCOLAX) packet Take 17 g by mouth every morning. Hold for loose stools.   Past Week at 0800  . pregabalin (LYRICA) 75 MG capsule Take 75 mg by mouth 2 (two) times daily.   07/25/2016 at 1600  . sevelamer carbonate (RENVELA) 800 MG tablet Take 800 mg by mouth daily.    Past Week at Unknown time  . simvastatin (ZOCOR) 20 MG tablet Take 20 mg by mouth daily.   07/25/2016 at 2000  . tiotropium (SPIRIVA) 18 MCG inhalation capsule Place 18 mcg into inhaler and inhale daily.   07/25/2016 at 0800  . Vancomycin (VANCOCIN) 750-5 MG/150ML-% SOLN With each dialysis, Tuesday, Thursday and Saturday. Last dose April 3rd 4000 mL      Assessment: Pharmacy consulted to dose heparin for PE in this 61 year old male.  No prior anticoag noted.  CrCl = 8.9 ml/min  Goal of Therapy:  Heparin level 0.3-0.7 units/ml Monitor platelets by anticoagulation protocol: Yes   Plan:  Give 4700 units bolus x 1 Start heparin infusion at 1300 units/hr Check anti-Xa level in 8 hours and daily while on heparin Continue to monitor H&H and platelets   3/11 05:00 heparin level 0.54. Recheck in 8 hours to confirm. 3/11: 2nd heparin level resulted @ 0.49. Recheck heparin level in am   Atisha Hamidi D 08/04/2016,1:29 PM

## 2016-08-04 NOTE — Progress Notes (Signed)
ANTICOAGULATION CONSULT NOTE - Initial Consult  Pharmacy Consult for warfarin Indication: pulmonary embolus  Allergies  Allergen Reactions  . Dust Mite Extract     Patient Measurements: Height: 6\' 2"  (188 cm) Weight: 188 lb 11.4 oz (85.6 kg) IBW/kg (Calculated) : 82.2  Vital Signs: Temp: 97.9 F (36.6 C) (03/11 1100) Temp Source: Oral (03/11 1100) BP: 131/68 (03/11 1100) Pulse Rate: 74 (03/11 1100)  Labs:  Recent Labs  08/03/16 1602 08/03/16 1657 08/03/16 1954 08/04/16 0009 08/04/16 0456  HGB 11.3*  --   --   --  9.5*  HCT 33.4*  --   --   --  28.6*  PLT 232  --   --   --  218  APTT  --  33  --   --   --   LABPROT  --  13.3  --   --   --   INR  --  1.01  --   --   --   HEPARINUNFRC  --   --   --   --  0.54  CREATININE 9.54*  --   --   --  10.83*  TROPONINI 0.21*  --  0.22* 0.27* 0.38*    Estimated Creatinine Clearance: 8.2 mL/min (by C-G formula based on SCr of 10.83 mg/dL (H)).   Assessment: Pharmacy consulted to dose and monitor warfarin in this 61 year old male diagnosed with a PE. Patient has ESRD on HD and is currently on a heparin drip. Will begin warfarin tonight and bridge with heparin.  Goal of Therapy:  INR 2-3 Monitor platelets by anticoagulation protocol: Yes   Plan:  Continue heparin drip - pharmacy monitoring Start warfarin 5 mg PO Daily this evening, will recheck INR with AM labs tomorrow.  Lenis Noon, PharmD, BCPS Clinical Pharmacist 08/04/2016,11:40 AM

## 2016-08-05 LAB — PROTIME-INR
INR: 1.13
Prothrombin Time: 14.6 seconds (ref 11.4–15.2)

## 2016-08-05 LAB — HEPARIN LEVEL (UNFRACTIONATED): Heparin Unfractionated: 0.37 IU/mL (ref 0.30–0.70)

## 2016-08-05 LAB — CBC
HCT: 28.8 % — ABNORMAL LOW (ref 40.0–52.0)
Hemoglobin: 9.6 g/dL — ABNORMAL LOW (ref 13.0–18.0)
MCH: 31.5 pg (ref 26.0–34.0)
MCHC: 33.4 g/dL (ref 32.0–36.0)
MCV: 94.2 fL (ref 80.0–100.0)
PLATELETS: 236 10*3/uL (ref 150–440)
RBC: 3.06 MIL/uL — ABNORMAL LOW (ref 4.40–5.90)
RDW: 16.4 % — AB (ref 11.5–14.5)
WBC: 8.2 10*3/uL (ref 3.8–10.6)

## 2016-08-05 LAB — PHOSPHORUS: Phosphorus: 5.6 mg/dL — ABNORMAL HIGH (ref 2.5–4.6)

## 2016-08-05 MED ORDER — VANCOMYCIN HCL 10 G IV SOLR
2000.0000 mg | Freq: Once | INTRAVENOUS | Status: AC
  Administered 2016-08-05: 2000 mg via INTRAVENOUS
  Filled 2016-08-05 (×2): qty 2000

## 2016-08-05 MED ORDER — VANCOMYCIN HCL IN DEXTROSE 750-5 MG/150ML-% IV SOLN
750.0000 mg | INTRAVENOUS | Status: DC
  Administered 2016-08-07: 750 mg via INTRAVENOUS
  Filled 2016-08-05: qty 150

## 2016-08-05 MED ORDER — SALINE SPRAY 0.65 % NA SOLN
1.0000 | NASAL | Status: DC | PRN
  Filled 2016-08-05: qty 44

## 2016-08-05 MED ORDER — WARFARIN SODIUM 5 MG PO TABS
5.0000 mg | ORAL_TABLET | Freq: Once | ORAL | Status: AC
  Administered 2016-08-05: 5 mg via ORAL
  Filled 2016-08-05: qty 1

## 2016-08-05 NOTE — Progress Notes (Signed)
Post hemodialysis:  Completed 2hours of treatment.1liter net fluid removal.Vancomycin given during treatment.Dialysis catheter capped/clamped,lumens wrapped with gauze/taped.

## 2016-08-05 NOTE — Progress Notes (Signed)
Start of hd 

## 2016-08-05 NOTE — Progress Notes (Signed)
Pre hd assessment  

## 2016-08-05 NOTE — Progress Notes (Signed)
Pre hd info 

## 2016-08-05 NOTE — Progress Notes (Signed)
ANTICOAGULATION CONSULT NOTE - FOLLOW UP   Pharmacy Consult for warfarin Indication: pulmonary embolus  Allergies  Allergen Reactions  . Dust Mite Extract     Patient Measurements: Height: 6\' 2"  (188 cm) Weight: 186 lb 8 oz (84.6 kg) IBW/kg (Calculated) : 82.2  Vital Signs: Temp: 98.3 F (36.8 C) (03/12 0453) Temp Source: Oral (03/12 0453) BP: 105/77 (03/12 0453) Pulse Rate: 77 (03/12 0453)  Labs:  Recent Labs  08/03/16 1602 08/03/16 1657  08/04/16 0009 08/04/16 0456 08/04/16 1115 08/04/16 1257 08/05/16 0517  HGB 11.3*  --   --   --  9.5*  --   --  9.6*  HCT 33.4*  --   --   --  28.6*  --   --  28.8*  PLT 232  --   --   --  218  --   --  236  APTT  --  33  --   --   --   --   --   --   LABPROT  --  13.3  --   --   --   --   --  14.6  INR  --  1.01  --   --   --   --   --  1.13  HEPARINUNFRC  --   --   --   --  0.54  --  0.49 0.37  CREATININE 9.54*  --   --   --  10.83*  --   --   --   TROPONINI 0.21*  --   < > 0.27* 0.38* 0.75*  --   --   < > = values in this interval not displayed.  Estimated Creatinine Clearance: 8.2 mL/min (by C-G formula based on SCr of 10.83 mg/dL (H)).   Assessment: Pharmacy consulted to dose and monitor warfarin in this 62 year old male diagnosed with a PE. Patient has ESRD on HD and is currently on a heparin drip. Will begin warfarin tonight and bridge with heparin.                        INR                 Dose   3/10:              1.01  3/11                                       5 mg  3/12               1.13                 5 mg   Goal of Therapy:  INR 2-3 Monitor platelets by anticoagulation protocol: Yes   Plan:  Continue heparin drip - pharmacy monitoring Will give warfarin 5 mg PO Daily this evening, will recheck INR with AM labs tomorrow.  Larene Beach, PharmD, BCPS Clinical Pharmacist 08/05/2016,10:08 AM

## 2016-08-05 NOTE — Progress Notes (Signed)
ANTIBIOTIC CONSULT NOTE - INITIAL  Pharmacy Consult for Vancomycin  Indication: bacteremia; MRSA on tip cath   Allergies  Allergen Reactions  . Dust Mite Extract     Patient Measurements: Height: 6\' 2"  (188 cm) Weight: 186 lb 8 oz (84.6 kg) IBW/kg (Calculated) : 82.2 Adjusted Body Weight:   Vital Signs: Temp: 98.2 F (36.8 C) (03/12 1100) Temp Source: Oral (03/12 1100) BP: 119/73 (03/12 1100) Pulse Rate: 72 (03/12 1100) Intake/Output from previous day: 03/11 0701 - 03/12 0700 In: -  Out: 238  Intake/Output from this shift: Total I/O In: 120 [P.O.:120] Out: 0   Labs:  Recent Labs  08/03/16 1602 08/04/16 0456 08/05/16 0517  WBC 9.8 8.0 8.2  HGB 11.3* 9.5* 9.6*  PLT 232 218 236  CREATININE 9.54* 10.83*  --    Estimated Creatinine Clearance: 8.2 mL/min (by C-G formula based on SCr of 10.83 mg/dL (H)). No results for input(s): VANCOTROUGH, VANCOPEAK, VANCORANDOM, GENTTROUGH, GENTPEAK, GENTRANDOM, TOBRATROUGH, TOBRAPEAK, TOBRARND, AMIKACINPEAK, AMIKACINTROU, AMIKACIN in the last 72 hours.   Microbiology: Recent Results (from the past 720 hour(s))  Surgical pcr screen     Status: None   Collection Time: 07/25/16  1:03 PM  Result Value Ref Range Status   MRSA, PCR NEGATIVE NEGATIVE Final   Staphylococcus aureus NEGATIVE NEGATIVE Final    Comment:        The Xpert SA Assay (FDA approved for NASAL specimens in patients over 3 years of age), is one component of a comprehensive surveillance program.  Test performance has been validated by [facility] for patients greater than or equal to 52 year old. It is not intended to diagnose infection nor to guide or monitor treatment.   Blood Culture (routine x 2)     Status: Abnormal   Collection Time: 07/26/16  8:08 AM  Result Value Ref Range Status   Specimen Description BLOOD LEFT ANTECUBITAL  Final   Special Requests BOTTLES DRAWN AEROBIC AND ANAEROBIC BCAV  Final   Culture  Setup Time   Final    GRAM POSITIVE  COCCI ANAEROBIC BOTTLE ONLY CRITICAL VALUE NOTED.  VALUE IS CONSISTENT WITH PREVIOUSLY REPORTED AND CALLED VALUE.    Culture (A)  Final    STAPHYLOCOCCUS AUREUS SUSCEPTIBILITIES PERFORMED ON PREVIOUS CULTURE WITHIN THE LAST 5 DAYS. Performed at [facility], [place] [address] [place], [place] [address]    Report Status 07/29/2016 FINAL  Final  Blood Culture (routine x 2)     Status: Abnormal   Collection Time: 07/26/16  9:57 AM  Result Value Ref Range Status   Specimen Description BLOOD LEFT ARM  Final   Special Requests BOTTLES DRAWN AEROBIC AND ANAEROBIC BCHV  Final   Culture  Setup Time   Final    GRAM POSITIVE COCCI AEROBIC BOTTLE ONLY CRITICAL RESULT CALLED TO, READ BACK BY AND VERIFIED WITH: [name] COOKSON AT 2325 07/26/16.PMH Performed at [facility], [place] [address], [place], [place] [address]    Culture METHICILLIN RESISTANT STAPHYLOCOCCUS AUREUS (A)  Final   Report Status 07/29/2016 FINAL  Final   Organism ID, Bacteria METHICILLIN RESISTANT STAPHYLOCOCCUS AUREUS  Final      Susceptibility   Methicillin resistant staphylococcus aureus - MIC*    CIPROFLOXACIN >=8 RESISTANT Resistant     ERYTHROMYCIN >=8 RESISTANT Resistant     GENTAMICIN <=0.5 SENSITIVE Sensitive     OXACILLIN >=4 RESISTANT Resistant     TETRACYCLINE <=1 SENSITIVE Sensitive     VANCOMYCIN 1 SENSITIVE Sensitive  TRIMETH/SULFA <=10 SENSITIVE Sensitive     CLINDAMYCIN RESISTANT Resistant     RIFAMPIN <=0.5 SENSITIVE Sensitive     Inducible Clindamycin POSITIVE Resistant     * METHICILLIN RESISTANT STAPHYLOCOCCUS AUREUS  Blood Culture ID Panel (Reflexed)     Status: Abnormal   Collection Time: 07/26/16  9:57 AM  Result Value Ref Range Status   Enterococcus species NOT DETECTED NOT DETECTED Final   Listeria monocytogenes NOT DETECTED NOT DETECTED Final   Staphylococcus species DETECTED (A) NOT DETECTED Final    Comment: CRITICAL RESULT CALLED TO, READ BACK BY AND VERIFIED WITH: [name] COOKSON AT  2325 07/26/16.PMH    Staphylococcus aureus DETECTED (A) NOT DETECTED Final    Comment: Methicillin (oxacillin)-resistant Staphylococcus aureus (MRSA). MRSA is predictably resistant to beta-lactam antibiotics (except ceftaroline). Preferred therapy is vancomycin unless clinically contraindicated. Patient requires contact precautions if  hospitalized. CRITICAL RESULT CALLED TO, READ BACK BY AND VERIFIED WITH: [name] COOKSON AT 2325 07/26/16.PMH    Methicillin resistance DETECTED (A) NOT DETECTED Final    Comment: CRITICAL RESULT CALLED TO, READ BACK BY AND VERIFIED WITH: [name] COOKSON AT 2325 07/26/16.PMH    Streptococcus species NOT DETECTED NOT DETECTED Final   Streptococcus agalactiae NOT DETECTED NOT DETECTED Final   Streptococcus pneumoniae NOT DETECTED NOT DETECTED Final   Streptococcus pyogenes NOT DETECTED NOT DETECTED Final   Acinetobacter baumannii NOT DETECTED NOT DETECTED Final   Enterobacteriaceae species NOT DETECTED NOT DETECTED Final   Enterobacter cloacae complex NOT DETECTED NOT DETECTED Final   Escherichia coli NOT DETECTED NOT DETECTED Final   Klebsiella oxytoca NOT DETECTED NOT DETECTED Final   Klebsiella pneumoniae NOT DETECTED NOT DETECTED Final   Proteus species NOT DETECTED NOT DETECTED Final   Serratia marcescens NOT DETECTED NOT DETECTED Final   Haemophilus influenzae NOT DETECTED NOT DETECTED Final   Neisseria meningitidis NOT DETECTED NOT DETECTED Final   Pseudomonas aeruginosa NOT DETECTED NOT DETECTED Final   Candida albicans NOT DETECTED NOT DETECTED Final   Candida glabrata NOT DETECTED NOT DETECTED Final   Candida krusei NOT DETECTED NOT DETECTED Final   Candida parapsilosis NOT DETECTED NOT DETECTED Final   Candida tropicalis NOT DETECTED NOT DETECTED Final  MRSA PCR Screening     Status: None   Collection Time: 07/27/16  1:10 AM  Result Value Ref Range Status   MRSA by PCR NEGATIVE NEGATIVE Final    Comment:        The GeneXpert MRSA Assay (FDA approved  for NASAL specimens only), is one component of a comprehensive MRSA colonization surveillance program. It is not intended to diagnose MRSA infection nor to guide or monitor treatment for MRSA infections.   Cath Tip Culture     Status: Abnormal   Collection Time: 07/27/16 12:07 PM  Result Value Ref Range Status   Specimen Description CATH TIP  Final   Special Requests NONE  Final   Culture (A)  Final    >=100,000 COLONIES/mL METHICILLIN RESISTANT STAPHYLOCOCCUS AUREUS   Report Status 07/30/2016 FINAL  Final   Organism ID, Bacteria METHICILLIN RESISTANT STAPHYLOCOCCUS AUREUS (A)  Final      Susceptibility   Methicillin resistant staphylococcus aureus - MIC*    CIPROFLOXACIN >=8 RESISTANT Resistant     ERYTHROMYCIN >=8 RESISTANT Resistant     GENTAMICIN <=0.5 SENSITIVE Sensitive     OXACILLIN >=4 RESISTANT Resistant     TETRACYCLINE <=1 SENSITIVE Sensitive     VANCOMYCIN 1 SENSITIVE Sensitive     TRIMETH/SULFA <=10 SENSITIVE  Sensitive     CLINDAMYCIN RESISTANT Resistant     RIFAMPIN <=0.5 SENSITIVE Sensitive     Inducible Clindamycin POSITIVE Resistant     * >=100,000 COLONIES/mL METHICILLIN RESISTANT STAPHYLOCOCCUS AUREUS  CULTURE, BLOOD (ROUTINE X 2) w Reflex to ID Panel     Status: None   Collection Time: 07/28/16  1:59 PM  Result Value Ref Range Status   Specimen Description BLOOD  LEFT HAND  Final   Special Requests BOTTLES DRAWN AEROBIC AND ANAEROBIC  BCAV  Final   Culture NO GROWTH 5 DAYS  Final   Report Status 08/02/2016 FINAL  Final  CULTURE, BLOOD (ROUTINE X 2) w Reflex to ID Panel     Status: None   Collection Time: 07/28/16  2:10 PM  Result Value Ref Range Status   Specimen Description BLOOD  LEFT WRIST  Final   Special Requests BOTTLES DRAWN AEROBIC AND ANAEROBIC  BCAV  Final   Culture NO GROWTH 5 DAYS  Final   Report Status 08/02/2016 FINAL  Final    Medical History: Past Medical History:  Diagnosis Date  . Anemia   . Asthma   . CHF (congestive heart  failure) ([place])   . Chronic systolic heart failure ([place])   . Complication of anesthesia    hypotension  . COPD (chronic obstructive pulmonary disease) ([place])   . Coronary artery disease   . Dialysis patient ([place])    Mon, Wed, Fri  . End stage renal disease ([place])   . GERD (gastroesophageal reflux disease)   . Headache   . History of kidney stones   . HLD (hyperlipidemia)   . HTN (hypertension)   . Hyperparathyroidism   . Myocardial infarction   . Peripheral vascular disease ([place])   . Shortness of breath dyspnea   . Sleep apnea   . Tobacco dependence     Medications:  Prescriptions Prior to Admission  Medication Sig Dispense Refill Last Dose  . acetaminophen (TYLENOL) 500 MG tablet Take 1,000 mg by mouth every 6 (six) hours as needed for moderate pain.   prn at prn  . albuterol (PROVENTIL HFA;VENTOLIN HFA) 108 (90 BASE) MCG/ACT inhaler Inhale 2 puffs into the lungs every 6 (six) hours as needed for wheezing or shortness of breath.    prn at prn  . aspirin 325 MG tablet Take 325 mg by mouth every morning.    Past Week at 0800  . atorvastatin (LIPITOR) 10 MG tablet Take 10 mg by mouth at bedtime.    07/25/2016 at 2000  . budesonide-formoterol (SYMBICORT) 160-4.5 MCG/ACT inhaler Inhale 2 puffs into the lungs 2 (two) times daily. Reported on 08/14/2015   07/25/2016 at 2000  . calcium acetate (PHOSLO) 667 MG capsule Take 667 mg by mouth 3 (three) times daily with meals.   07/25/2016 at 2000  . Darbepoetin Alfa (ARANESP) 40 MCG/0.4ML SOSY injection Inject 0.4 mLs (40 mcg total) into the vein every Monday with hemodialysis. 8.4 mL  07/26/2016 at Unknown time  . docusate sodium (COLACE) 100 MG capsule Take 100 mg by mouth 2 (two) times daily as needed. Reported on 08/14/2015   prn at prn  . doxercalciferol (HECTOROL) 4 MCG/2ML injection Inject 1.25 mLs (2.5 mcg total) into the vein every Monday, Wednesday, and Friday with hemodialysis. 2 mL  07/26/2016 at Unknown time  . ferric gluconate 125 mg in sodium  chloride 0.9 % 100 mL Inject 125 mg into the vein every Monday, Wednesday, and Friday with hemodialysis.   07/26/2016 at  Unknown time  . fluticasone (FLONASE) 50 MCG/ACT nasal spray Place 1 spray into both nostrils daily.   Past Week at 0800  . ipratropium-albuterol (DUONEB) 0.5-2.5 (3) MG/3ML SOLN Take 3 mLs by nebulization every 4 (four) hours as needed (SOB).   prn at prn  . midodrine (PROAMATINE) 5 MG tablet Take 5 mg by mouth. 3 times a week, 30 minutes before dialysis   Past Week at Unknown time  . Multiple Vitamins-Minerals (MULTIVITAMIN WITH MINERALS) tablet Take 1 tablet by mouth daily.   07/25/2016 at 2000  . multivitamin (RENA-VIT) TABS tablet Take 1 tablet by mouth at bedtime.  0 Past Week at Unknown time  . Nutritional Supplements (FEEDING SUPPLEMENT, NEPRO CARB STEADY,) LIQD Take 237 mLs by mouth daily.   07/24/2016 at Unknown time  . OxyCODONE HCl, Abuse Deter, (OXAYDO) 5 MG TABA Take 1 tablet by mouth every 8 (eight) hours as needed. 30 tablet 0 07/25/2016 at prn  . oxyCODONE-acetaminophen (PERCOCET) 7.5-325 MG tablet Take 1 tablet by mouth every 4 (four) hours as needed for severe pain. 12 tablet 0   . pantoprazole (PROTONIX) 20 MG tablet Take 20 mg by mouth daily.   07/26/2016 at 0600  . polyethylene glycol (MIRALAX / GLYCOLAX) packet Take 17 g by mouth every morning. Hold for loose stools.   Past Week at 0800  . pregabalin (LYRICA) 75 MG capsule Take 75 mg by mouth 2 (two) times daily.   07/25/2016 at 1600  . sevelamer carbonate (RENVELA) 800 MG tablet Take 800 mg by mouth daily.    Past Week at Unknown time  . simvastatin (ZOCOR) 20 MG tablet Take 20 mg by mouth daily.   07/25/2016 at 2000  . tiotropium (SPIRIVA) 18 MCG inhalation capsule Place 18 mcg into inhaler and inhale daily.   07/25/2016 at 0800  . Vancomycin (VANCOCIN) 750-5 MG/150ML-% SOLN With each dialysis, Tuesday, Thursday and Saturday. Last dose April 3rd 4000 mL     Scheduled:  . aspirin  325 mg Oral [id number]  . atorvastatin  10  mg Oral QHS  . calcium acetate  667 mg Oral TID WC  . epoetin (EPOGEN/PROCRIT) injection  4,000 Units Intravenous Q M,W,F-HD  . mometasone-formoterol  2 puff Inhalation BID  . pantoprazole  40 mg Oral Daily  . pregabalin  75 mg Oral BID  . sevelamer carbonate  800 mg Oral Daily  . tiotropium  18 mcg Inhalation Daily  . vancomycin  2,000 mg Intravenous Once  . [START ON 08/07/2016] vancomycin  750 mg Intravenous Q M,W,F-HD  . warfarin  5 mg Oral ONCE-1800  . Warfarin - Pharmacist Dosing Inpatient   Does not apply q1800   Assessment: Pharmacy consulted to dose and monitor Vancomycin in this 61 year old HD patient. Patient cath tip growing MRSA. Nephrologist would like to start Vancomycin and continue until 4/2.   Goal of Therapy:  Vancomycin trough level 15-20 mcg/ml  Plan:  Will give Vancomycin 2 g IV x 1 load and will then start Vancomycin 750 mg qHD. Will check trough level prior to the 3rd dialysis session.    Militza Devery D 08/05/2016,12:11 PM

## 2016-08-05 NOTE — Progress Notes (Signed)
[facility] [place] NAME: [name]    MR#:  [id number]  DATE OF BIRTH:  1955-06-12  SUBJECTIVE:  Seen at HD Frustrated and wants to gohome  REVIEW OF SYSTEMS:   Review of Systems  Constitutional: Negative for chills, fever and weight loss.  HENT: Negative for ear discharge, ear pain and nosebleeds.   Eyes: Negative for blurred vision, pain and discharge.  Respiratory: Negative for sputum production, shortness of breath, wheezing and stridor.   Cardiovascular: Negative for chest pain, palpitations, orthopnea and PND.  Gastrointestinal: Negative for abdominal pain, diarrhea, nausea and vomiting.  Genitourinary: Negative for frequency and urgency.  Musculoskeletal: Negative for back pain and joint pain.  Neurological: Positive for weakness. Negative for sensory change, speech change and focal weakness.  Psychiatric/Behavioral: Negative for depression and hallucinations. The patient is not nervous/anxious.    Tolerating Diet:yes Tolerating PT: pending  DRUG ALLERGIES:   Allergies  Allergen Reactions  . Dust Mite Extract     VITALS:  Blood pressure (!) 129/94, pulse 65, temperature 98.4 F (36.9 C), temperature source Oral, resp. rate 20, height 6\' 2"  (1.88 m), weight 84.5 kg (186 lb 4.6 oz), SpO2 98 %.  PHYSICAL EXAMINATION:   Physical Exam  GENERAL:  61 y.o.-year-old patient lying in the bed with no acute distress.  EYES: Pupils equal, round, reactive to light and accommodation. No scleral icterus. Extraocular muscles intact.  HEENT: Head atraumatic, normocephalic. Oropharynx and nasopharynx clear.  NECK:  Supple, no jugular venous distention. No thyroid enlargement, no tenderness.  LUNGS: Normal breath sounds bilaterally, no wheezing, rales, rhonchi. No use of accessory muscles of respiration.  CARDIOVASCULAR: S1, S2 normal. No murmurs, rubs, or gallops.  ABDOMEN: Soft, nontender, nondistended. Bowel sounds present.  No organomegaly or mass.  EXTREMITIES: No cyanosis, clubbing or edema b/l.    NEUROLOGIC: Cranial nerves II through XII are intact. No focal Motor or sensory deficits b/l.   PSYCHIATRIC:  patient is alert and oriented x 3.  SKIN: No obvious rash, lesion, or ulcer.   LABORATORY PANEL:  CBC  Recent Labs Lab 08/05/16 0517  WBC 8.2  HGB 9.6*  HCT 28.8*  PLT 236    Chemistries   Recent Labs Lab 08/03/16 1657 08/04/16 0456  NA  --  136  K  --  4.5  CL  --  100*  CO2  --  25  GLUCOSE  --  107*  BUN  --  65*  CREATININE  --  10.83*  CALCIUM  --  8.7*  AST 51*  --   ALT 30  --   ALKPHOS 78  --   BILITOT 0.7  --    Cardiac Enzymes  Recent Labs Lab 08/04/16 1115  TROPONINI 0.75*   RADIOLOGY:  Dg Chest 2 View  Result Date: 08/03/2016 CLINICAL DATA:  Acute chest pain and shortness of breath. EXAM: CHEST  2 VIEW COMPARISON:  07/26/2016 and prior radiograph FINDINGS: Cardiomegaly and left central venous catheter with tip overlying the superior cavoatrial junction noted. A small right pleural effusion and right basilar atelectasis/scarring again noted. There is no evidence of pulmonary edema, left pleural effusion or pneumothorax. IMPRESSION: Cardiomegaly without evidence of acute cardiopulmonary disease. Small right pleural effusion and right basilar atelectasis/scarring again noted. Electronically Signed   By: Margarette Canada M.D.   On: 08/03/2016 16:59   Ct Angio Chest Pe W And/or Wo Contrast  Result Date: 08/03/2016 CLINICAL DATA:  61 year old male with  chest pain and shortness of breath. History of end-stage renal disease and CHF. EXAM: CT ANGIOGRAPHY CHEST WITH CONTRAST TECHNIQUE: Multidetector CT imaging of the chest was performed using the standard protocol during bolus administration of intravenous contrast. Multiplanar CT image reconstructions and MIPs were obtained to evaluate the vascular anatomy. CONTRAST:  75 cc intravenous Isovue 370 COMPARISON:  08/03/2016 and prior  chest radiographs. 07/06/2013 chest CT FINDINGS: Cardiovascular: Pulmonary emboli within the right upper lobe noted. RV/LV ratio is 0.72. Cardiomegaly and moderate to heavy coronary artery calcifications again noted. Ectasia of the thoracic aorta noted without aneurysm. No pericardial effusion identified. A right subclavian stent is noted. A left IJ central venous catheter with tip at the superior cavoatrial junction noted. Mediastinum/Nodes: No mediastinal mass or enlarged lymph nodes. Lungs/Pleura: A small right pleural effusion noted with right lower lobe atelectasis/ scarring. No airspace disease or mass identified. There is no evidence of pneumothorax. Upper Abdomen: No acute abnormality. Musculoskeletal: No acute or suspicious focal abnormalities noted. Review of the MIP images confirms the above findings. IMPRESSION: Right upper lobe pulmonary emboli. No CT evidence of right heart strain. Small right pleural effusion and right lower lobe atelectasis/ scarring. Cardiomegaly and coronary artery disease. Critical Value/emergent results were called by telephone at the time of interpretation on 08/03/2016 at 7:52 pm to Dr. Merlyn Lot , who verbally acknowledged these results. Electronically Signed   By: Margarette Canada M.D.   On: 08/03/2016 19:56   US Venous Img Lower Bilateral  Result Date: 08/04/2016 CLINICAL DATA:  Two-day history of chest pain and shortness of breath. Pulmonary embolus by chest CT. EXAM: BILATERAL LOWER EXTREMITY VENOUS DOPPLER ULTRASOUND TECHNIQUE: Gray-scale sonography with graded compression, as well as color Doppler and duplex ultrasound were performed to evaluate the lower extremity deep venous systems from the level of the common femoral vein and including the common femoral, femoral, profunda femoral, popliteal and calf veins including the posterior tibial, peroneal and gastrocnemius veins when visible. The superficial great saphenous vein was also interrogated. Spectral Doppler  was utilized to evaluate flow at rest and with distal augmentation maneuvers in the common femoral, femoral and popliteal veins. COMPARISON:  None. FINDINGS: RIGHT LOWER EXTREMITY Common Femoral Vein: Not well seen secondary to overlying bandage material. Saphenofemoral Junction: Not well seen secondary to overlying bandage material. Profunda Femoral Vein: No evidence of thrombus. Normal compressibility and flow on color Doppler imaging. Femoral Vein: No evidence of thrombus. Normal compressibility, respiratory phasicity and response to augmentation. Popliteal Vein: No evidence of thrombus. Normal compressibility, respiratory phasicity and response to augmentation. Calf Veins: Peroneal vein is difficult to visualize. Nonocclusive thrombus cannot be entirely excluded within this vessel. Superficial Great Saphenous Vein: Patent near the femoral vein confluence. LEFT LOWER EXTREMITY Common Femoral Vein: No evidence of thrombus. Normal compressibility. Saphenofemoral Junction: No evidence of thrombus. Normal compressibility and flow on color Doppler imaging. Profunda Femoral Vein: No evidence of thrombus. Normal compressibility and flow on color Doppler imaging. Femoral Vein: Nonocclusive intraluminal thrombus identified. Popliteal Vein: No evidence of thrombus.  Normal compressibility. Calf Veins: Not well seen in this patient status post left BKA. IMPRESSION: 1. Nonocclusive thrombus identified left femoral vein in this patient with documented history of pulmonary embolus on CT scan from yesterday. 2. Limited assessment of calf veins bilaterally. Thrombus within the posterior tibial and peroneal veins bilaterally cannot be excluded on this study. Electronically Signed   By: Misty Stanley M.D.   On: 08/04/2016 16:42   ASSESSMENT AND PLAN:  Joel [place]  [name]  is a 61 y.o. male who presents with Acute onset of chest pain today. He had associated shortness of breath. Here in the ED he was found on CT imaging to have a  pulmonary embolus. IV heparin was started   * Acute right Pulmonary embolism (HCC) - IV heparin. CT imaging showed no evidence of right heart strain.  -IV heparin gtt -hgb stable at 9.9 -start po coumadin -[place] LE shows left femoral vein DVT  *  HYPERTENSION, BENIGN - stable, continue home meds   * CAD, NATIVE VESSEL - patient's troponin was elevated, likely related to his pulmonary embolus.   *  ESRD (end stage renal disease) on dialysis [facility]) - nephrology consult for hemodialysis support   * Chronic systolic heart failure ([place]) - continue home meds   * Hyperlipidemia - continue home meds  Case discussed with Care Management/Social Worker. Management plans discussed with the patient, family and they are in agreement.  CODE STATUS: full  DVT Prophylaxis:coumadin TOTAL TIME TAKING CARE OF THIS PATIENT: 30 minutes.  >50% time spent on counselling and coordination of caredr singh POSSIBLE D/C IN 1-2 DAYS, DEPENDING ON CLINICAL CONDITION.  Note: This dictation was prepared with Dragon dictation along with smaller phrase technology. Any transcriptional errors that result from this process are unintentional.  Deantae Shackleton M.D on 08/05/2016 at 4:24 PM  Between 7am to 6pm - Pager - [phone]  After 6pm go to [email or web address] - password [email or web address] [facility]  Office  [phone]  CC: Primary care physician; [place]

## 2016-08-05 NOTE — Progress Notes (Signed)
Post hd tx 

## 2016-08-05 NOTE — Progress Notes (Signed)
Hemodialysis completed. 

## 2016-08-05 NOTE — Care Management (Signed)
Patient from [facility]. Updated CSW. Please reconsult RNCM if needs arise.

## 2016-08-05 NOTE — Progress Notes (Signed)
ANTICOAGULATION CONSULT NOTE - Initial Consult  Pharmacy Consult for Heparin  Indication: pulmonary embolus  Allergies  Allergen Reactions  . Dust Mite Extract     Patient Measurements: Height: 6\' 2"  (188 cm) Weight: 186 lb 8 oz (84.6 kg) IBW/kg (Calculated) : 82.2 Heparin Dosing Weight: 78 kg   Vital Signs: Temp: 98.3 F (36.8 C) (03/12 0453) Temp Source: Oral (03/12 0453) BP: 105/77 (03/12 0453) Pulse Rate: 77 (03/12 0453)  Labs:  Recent Labs  08/03/16 1602 08/03/16 1657  08/04/16 0009 08/04/16 0456 08/04/16 1115 08/04/16 1257 08/05/16 0517  HGB 11.3*  --   --   --  9.5*  --   --  9.6*  HCT 33.4*  --   --   --  28.6*  --   --  28.8*  PLT 232  --   --   --  218  --   --  236  APTT  --  33  --   --   --   --   --   --   LABPROT  --  13.3  --   --   --   --   --  14.6  INR  --  1.01  --   --   --   --   --  1.13  HEPARINUNFRC  --   --   --   --  0.54  --  0.49 0.37  CREATININE 9.54*  --   --   --  10.83*  --   --   --   TROPONINI 0.21*  --   < > 0.27* 0.38* 0.75*  --   --   < > = values in this interval not displayed.  Estimated Creatinine Clearance: 8.2 mL/min (by C-G formula based on SCr of 10.83 mg/dL (H)).   Medical History: Past Medical History:  Diagnosis Date  . Anemia   . Asthma   . CHF (congestive heart failure) ([place])   . Chronic systolic heart failure ([place])   . Complication of anesthesia    hypotension  . COPD (chronic obstructive pulmonary disease) ([place])   . Coronary artery disease   . Dialysis patient ([facility])    Mon, Wed, Fri  . End stage renal disease ([place])   . GERD (gastroesophageal reflux disease)   . Headache   . History of kidney stones   . HLD (hyperlipidemia)   . HTN (hypertension)   . Hyperparathyroidism   . Myocardial infarction   . Peripheral vascular disease ([place])   . Shortness of breath dyspnea   . Sleep apnea   . Tobacco dependence     Medications:  Prescriptions Prior to Admission  Medication Sig Dispense Refill Last  Dose  . acetaminophen (TYLENOL) 500 MG tablet Take 1,000 mg by mouth every 6 (six) hours as needed for moderate pain.   prn at prn  . albuterol (PROVENTIL HFA;VENTOLIN HFA) 108 (90 BASE) MCG/ACT inhaler Inhale 2 puffs into the lungs every 6 (six) hours as needed for wheezing or shortness of breath.    prn at prn  . aspirin 325 MG tablet Take 325 mg by mouth every morning.    Past Week at 0800  . atorvastatin (LIPITOR) 10 MG tablet Take 10 mg by mouth at bedtime.    07/25/2016 at 2000  . budesonide-formoterol (SYMBICORT) 160-4.5 MCG/ACT inhaler Inhale 2 puffs into the lungs 2 (two) times daily. Reported on 08/14/2015   07/25/2016 at 2000  . calcium acetate (PHOSLO) 667 MG  capsule Take 667 mg by mouth 3 (three) times daily with meals.   07/25/2016 at 2000  . Darbepoetin Alfa (ARANESP) 40 MCG/0.4ML SOSY injection Inject 0.4 mLs (40 mcg total) into the vein every Monday with hemodialysis. 8.4 mL  07/26/2016 at Unknown time  . docusate sodium (COLACE) 100 MG capsule Take 100 mg by mouth 2 (two) times daily as needed. Reported on 08/14/2015   prn at prn  . doxercalciferol (HECTOROL) 4 MCG/2ML injection Inject 1.25 mLs (2.5 mcg total) into the vein every Monday, Wednesday, and Friday with hemodialysis. 2 mL  07/26/2016 at Unknown time  . ferric gluconate 125 mg in sodium chloride 0.9 % 100 mL Inject 125 mg into the vein every Monday, Wednesday, and Friday with hemodialysis.   07/26/2016 at Unknown time  . fluticasone (FLONASE) 50 MCG/ACT nasal spray Place 1 spray into both nostrils daily.   Past Week at 0800  . ipratropium-albuterol (DUONEB) 0.5-2.5 (3) MG/3ML SOLN Take 3 mLs by nebulization every 4 (four) hours as needed (SOB).   prn at prn  . midodrine (PROAMATINE) 5 MG tablet Take 5 mg by mouth. 3 times a week, 30 minutes before dialysis   Past Week at Unknown time  . Multiple Vitamins-Minerals (MULTIVITAMIN WITH MINERALS) tablet Take 1 tablet by mouth daily.   07/25/2016 at 2000  . multivitamin (RENA-VIT) TABS tablet  Take 1 tablet by mouth at bedtime.  0 Past Week at Unknown time  . Nutritional Supplements (FEEDING SUPPLEMENT, NEPRO CARB STEADY,) LIQD Take 237 mLs by mouth daily.   07/24/2016 at Unknown time  . OxyCODONE HCl, Abuse Deter, (OXAYDO) 5 MG TABA Take 1 tablet by mouth every 8 (eight) hours as needed. 30 tablet 0 07/25/2016 at prn  . oxyCODONE-acetaminophen (PERCOCET) 7.5-325 MG tablet Take 1 tablet by mouth every 4 (four) hours as needed for severe pain. 12 tablet 0   . pantoprazole (PROTONIX) 20 MG tablet Take 20 mg by mouth daily.   07/26/2016 at 0600  . polyethylene glycol (MIRALAX / GLYCOLAX) packet Take 17 g by mouth every morning. Hold for loose stools.   Past Week at 0800  . pregabalin (LYRICA) 75 MG capsule Take 75 mg by mouth 2 (two) times daily.   07/25/2016 at 1600  . sevelamer carbonate (RENVELA) 800 MG tablet Take 800 mg by mouth daily.    Past Week at Unknown time  . simvastatin (ZOCOR) 20 MG tablet Take 20 mg by mouth daily.   07/25/2016 at 2000  . tiotropium (SPIRIVA) 18 MCG inhalation capsule Place 18 mcg into inhaler and inhale daily.   07/25/2016 at 0800  . Vancomycin (VANCOCIN) 750-5 MG/150ML-% SOLN With each dialysis, Tuesday, Thursday and Saturday. Last dose April 3rd 4000 mL      Assessment: Pharmacy consulted to dose heparin for PE in this 61 year old male.  No prior anticoag noted.  CrCl = 8.9 ml/min  Goal of Therapy:  Heparin level 0.3-0.7 units/ml Monitor platelets by anticoagulation protocol: Yes   Plan:  Give 4700 units bolus x 1 Start heparin infusion at 1300 units/hr Check anti-Xa level in 8 hours and daily while on heparin Continue to monitor H&H and platelets   3/11 05:00 heparin level 0.54. Recheck in 8 hours to confirm. 3/11: 2nd heparin level resulted @ 0.49. Recheck heparin level in am   3/12 AM heparin level 0.37. Continue current regimen. Recheck CBC and heparin level with tomorrow AM labs.  Parrie Rasco S 08/05/2016,6:05 AM

## 2016-08-05 NOTE — Progress Notes (Signed)
[facility]  ROUNDING NOTE   Subjective:  Patient on heparin drip for PE. He completed hemodialysis yesterday. Patient was initially unwilling to undergo dialysis but has agreed to perform a partial dialysis treatment today.  Objective:  Vital signs in last 24 hours:  Temp:  [98 F (36.7 C)-98.9 F (37.2 C)] 98.2 F (36.8 C) (03/12 1100) Pulse Rate:  [72-78] 72 (03/12 1100) Resp:  [18] 18 (03/12 1100) BP: (104-119)/(54-77) 119/73 (03/12 1100) SpO2:  [97 %-99 %] 98 % (03/12 1315) Weight:  [84.3 kg (185 lb 13.6 oz)-84.6 kg (186 lb 8 oz)] 84.6 kg (186 lb 8 oz) (03/12 0453)  Weight change: 7.581 kg (16 lb 11.4 oz) Filed Weights   08/04/16 1100 08/04/16 1440 08/05/16 0453  Weight: 85.6 kg (188 lb 11.4 oz) 84.3 kg (185 lb 13.6 oz) 84.6 kg (186 lb 8 oz)    Intake/Output: I/O last 3 completed shifts: In: 138.9 [I.V.:138.9] Out: 238 [Other:238]   Intake/Output this shift:  Total I/O In: 120 [P.O.:120] Out: 0   Physical Exam: General: Laying in bed  Head: Normocephalic, atraumatic. Moist oral mucosal membranes  Eyes: Anicteric  Neck: Supple, trachea midline  Lungs:  Clear to auscultation, Normal effort   Heart: Regular rate and rhythm  Abdomen:  Soft, nontender  Extremities: Left BKA, right toe amputations  Neurologic: Nonfocal, moving all four extremities  Skin: No lesions  Access: IJ PC    Basic Metabolic Panel:  Recent Labs Lab 07/29/16 1649 07/30/16 1109 08/03/16 1602 08/04/16 0456 08/05/16 0518  NA 137 137 136 136  --   K 6.0* 4.9 4.5 4.5  --   CL 96* 97* 97* 100*  --   CO2 24 29 26 25   --   GLUCOSE 93 99 88 107*  --   BUN 120* 60* 57* 65*  --   CREATININE 15.92* 9.70* 9.54* 10.83*  --   CALCIUM 8.3* 8.7* 9.3 8.7*  --   PHOS 10.0* 7.5*  --   --  5.6*    Liver Function Tests:  Recent Labs Lab 07/29/16 1649 07/30/16 1109 08/03/16 1657  AST  --   --  51*  ALT  --   --  30  ALKPHOS  --   --  78  BILITOT  --   --  0.7  PROT  --   --   7.8  ALBUMIN 2.5* 2.4* 2.9*   No results for input(s): LIPASE, AMYLASE in the last 168 hours. No results for input(s): AMMONIA in the last 168 hours.  CBC:  Recent Labs Lab 07/30/16 0451 07/30/16 1030 08/03/16 1602 08/04/16 0456 08/05/16 0517  WBC 5.6 5.8 9.8 8.0 8.2  HGB 10.5* 10.4* 11.3* 9.5* 9.6*  HCT 31.2* 31.2* 33.4* 28.6* 28.8*  MCV 92.9 93.4 93.1 94.1 94.2  PLT 120* 126* 232 218 236    Cardiac Enzymes:  Recent Labs Lab 08/03/16 1602 08/03/16 1954 08/04/16 0009 08/04/16 0456 08/04/16 1115  TROPONINI 0.21* 0.22* 0.27* 0.38* 0.75*    BNP: Invalid input(s): POCBNP  CBG:  Recent Labs Lab 07/30/16 1633 07/30/16 2135 07/31/16 0733 07/31/16 1121 08/04/16 1655  GLUCAP 113* 107* 99 8 71    Microbiology: Results for orders placed or performed during the hospital encounter of 07/26/16  Blood Culture (routine x 2)     Status: Abnormal   Collection Time: 07/26/16  8:08 AM  Result Value Ref Range Status   Specimen Description BLOOD LEFT ANTECUBITAL  Final   Special Requests BOTTLES DRAWN  AEROBIC AND ANAEROBIC BCAV  Final   Culture  Setup Time   Final    GRAM POSITIVE COCCI ANAEROBIC BOTTLE ONLY CRITICAL VALUE NOTED.  VALUE IS CONSISTENT WITH PREVIOUSLY REPORTED AND CALLED VALUE.    Culture (A)  Final    STAPHYLOCOCCUS AUREUS SUSCEPTIBILITIES PERFORMED ON PREVIOUS CULTURE WITHIN THE LAST 5 DAYS. Performed at [facility], [place] [address], [place], [place] [address]    Report Status 07/29/2016 FINAL  Final  Blood Culture (routine x 2)     Status: Abnormal   Collection Time: 07/26/16  9:57 AM  Result Value Ref Range Status   Specimen Description BLOOD LEFT ARM  Final   Special Requests BOTTLES DRAWN AEROBIC AND ANAEROBIC BCHV  Final   Culture  Setup Time   Final    GRAM POSITIVE COCCI AEROBIC BOTTLE ONLY CRITICAL RESULT CALLED TO, READ BACK BY AND VERIFIED WITH: [name] COOKSON AT 2325 07/26/16.PMH Performed at [facility], [place] [address] [place], [place] [address]    Culture METHICILLIN RESISTANT STAPHYLOCOCCUS AUREUS (A)  Final   Report Status 07/29/2016 FINAL  Final   Organism ID, Bacteria METHICILLIN RESISTANT STAPHYLOCOCCUS AUREUS  Final      Susceptibility   Methicillin resistant staphylococcus aureus - MIC*    CIPROFLOXACIN >=8 RESISTANT Resistant     ERYTHROMYCIN >=8 RESISTANT Resistant     GENTAMICIN <=0.5 SENSITIVE Sensitive     OXACILLIN >=4 RESISTANT Resistant     TETRACYCLINE <=1 SENSITIVE Sensitive     VANCOMYCIN 1 SENSITIVE Sensitive     TRIMETH/SULFA <=10 SENSITIVE Sensitive     CLINDAMYCIN RESISTANT Resistant     RIFAMPIN <=0.5 SENSITIVE Sensitive     Inducible Clindamycin POSITIVE Resistant     * METHICILLIN RESISTANT STAPHYLOCOCCUS AUREUS  Blood Culture ID Panel (Reflexed)     Status: Abnormal   Collection Time: 07/26/16  9:57 AM  Result Value Ref Range Status   Enterococcus species NOT DETECTED NOT DETECTED Final   Listeria monocytogenes NOT DETECTED NOT DETECTED Final   Staphylococcus species DETECTED (A) NOT DETECTED Final    Comment: CRITICAL RESULT CALLED TO, READ BACK BY AND VERIFIED WITH: [name] COOKSON AT 2325 07/26/16.PMH    Staphylococcus aureus DETECTED (A) NOT DETECTED Final    Comment: Methicillin (oxacillin)-resistant Staphylococcus aureus (MRSA). MRSA is predictably resistant to beta-lactam antibiotics (except ceftaroline). Preferred therapy is vancomycin unless clinically contraindicated. Patient requires contact precautions if  hospitalized. CRITICAL RESULT CALLED TO, READ BACK BY AND VERIFIED WITH: [name] COOKSON AT 2325 07/26/16.PMH    Methicillin resistance DETECTED (A) NOT DETECTED Final    Comment: CRITICAL RESULT CALLED TO, READ BACK BY AND VERIFIED WITH: [name] COOKSON AT 2325 07/26/16.PMH    Streptococcus species NOT DETECTED NOT DETECTED Final   Streptococcus agalactiae NOT DETECTED NOT DETECTED Final   Streptococcus pneumoniae NOT DETECTED NOT DETECTED Final   Streptococcus  pyogenes NOT DETECTED NOT DETECTED Final   Acinetobacter baumannii NOT DETECTED NOT DETECTED Final   Enterobacteriaceae species NOT DETECTED NOT DETECTED Final   Enterobacter cloacae complex NOT DETECTED NOT DETECTED Final   Escherichia coli NOT DETECTED NOT DETECTED Final   Klebsiella oxytoca NOT DETECTED NOT DETECTED Final   Klebsiella pneumoniae NOT DETECTED NOT DETECTED Final   Proteus species NOT DETECTED NOT DETECTED Final   Serratia marcescens NOT DETECTED NOT DETECTED Final   Haemophilus influenzae NOT DETECTED NOT DETECTED Final   Neisseria meningitidis NOT DETECTED NOT DETECTED Final   Pseudomonas aeruginosa NOT DETECTED NOT  DETECTED Final   Candida albicans NOT DETECTED NOT DETECTED Final   Candida glabrata NOT DETECTED NOT DETECTED Final   Candida krusei NOT DETECTED NOT DETECTED Final   Candida parapsilosis NOT DETECTED NOT DETECTED Final   Candida tropicalis NOT DETECTED NOT DETECTED Final  MRSA PCR Screening     Status: None   Collection Time: 07/27/16  1:10 AM  Result Value Ref Range Status   MRSA by PCR NEGATIVE NEGATIVE Final    Comment:        The GeneXpert MRSA Assay (FDA approved for NASAL specimens only), is one component of a comprehensive MRSA colonization surveillance program. It is not intended to diagnose MRSA infection nor to guide or monitor treatment for MRSA infections.   Cath Tip Culture     Status: Abnormal   Collection Time: 07/27/16 12:07 PM  Result Value Ref Range Status   Specimen Description CATH TIP  Final   Special Requests NONE  Final   Culture (A)  Final    >=100,000 COLONIES/mL METHICILLIN RESISTANT STAPHYLOCOCCUS AUREUS   Report Status 07/30/2016 FINAL  Final   Organism ID, Bacteria METHICILLIN RESISTANT STAPHYLOCOCCUS AUREUS (A)  Final      Susceptibility   Methicillin resistant staphylococcus aureus - MIC*    CIPROFLOXACIN >=8 RESISTANT Resistant     ERYTHROMYCIN >=8 RESISTANT Resistant     GENTAMICIN <=0.5 SENSITIVE Sensitive      OXACILLIN >=4 RESISTANT Resistant     TETRACYCLINE <=1 SENSITIVE Sensitive     VANCOMYCIN 1 SENSITIVE Sensitive     TRIMETH/SULFA <=10 SENSITIVE Sensitive     CLINDAMYCIN RESISTANT Resistant     RIFAMPIN <=0.5 SENSITIVE Sensitive     Inducible Clindamycin POSITIVE Resistant     * >=100,000 COLONIES/mL METHICILLIN RESISTANT STAPHYLOCOCCUS AUREUS  CULTURE, BLOOD (ROUTINE X 2) w Reflex to ID Panel     Status: None   Collection Time: 07/28/16  1:59 PM  Result Value Ref Range Status   Specimen Description BLOOD  LEFT HAND  Final   Special Requests BOTTLES DRAWN AEROBIC AND ANAEROBIC  BCAV  Final   Culture NO GROWTH 5 DAYS  Final   Report Status 08/02/2016 FINAL  Final  CULTURE, BLOOD (ROUTINE X 2) w Reflex to ID Panel     Status: None   Collection Time: 07/28/16  2:10 PM  Result Value Ref Range Status   Specimen Description BLOOD  LEFT WRIST  Final   Special Requests BOTTLES DRAWN AEROBIC AND ANAEROBIC  BCAV  Final   Culture NO GROWTH 5 DAYS  Final   Report Status 08/02/2016 FINAL  Final    Coagulation Studies:  Recent Labs  08/03/16 1657 08/05/16 0517  LABPROT 13.3 14.6  INR 1.01 1.13    Urinalysis: No results for input(s): COLORURINE, LABSPEC, PHURINE, GLUCOSEU, HGBUR, BILIRUBINUR, KETONESUR, PROTEINUR, UROBILINOGEN, NITRITE, LEUKOCYTESUR in the last 72 hours.  Invalid input(s): APPERANCEUR    Imaging: Dg Chest 2 View  Result Date: 08/03/2016 CLINICAL DATA:  Acute chest pain and shortness of breath. EXAM: CHEST  2 VIEW COMPARISON:  07/26/2016 and prior radiograph FINDINGS: Cardiomegaly and left central venous catheter with tip overlying the superior cavoatrial junction noted. A small right pleural effusion and right basilar atelectasis/scarring again noted. There is no evidence of pulmonary edema, left pleural effusion or pneumothorax. IMPRESSION: Cardiomegaly without evidence of acute cardiopulmonary disease. Small right pleural effusion and right basilar  atelectasis/scarring again noted. Electronically Signed   By: Margarette Canada M.D.   On: 08/03/2016 16:59  Ct Angio Chest Pe W And/or Wo Contrast  Result Date: 08/03/2016 CLINICAL DATA:  61 year old male with chest pain and shortness of breath. History of end-stage renal disease and CHF. EXAM: CT ANGIOGRAPHY CHEST WITH CONTRAST TECHNIQUE: Multidetector CT imaging of the chest was performed using the standard protocol during bolus administration of intravenous contrast. Multiplanar CT image reconstructions and MIPs were obtained to evaluate the vascular anatomy. CONTRAST:  75 cc intravenous Isovue 370 COMPARISON:  08/03/2016 and prior chest radiographs. 07/06/2013 chest CT FINDINGS: Cardiovascular: Pulmonary emboli within the right upper lobe noted. RV/LV ratio is 0.72. Cardiomegaly and moderate to heavy coronary artery calcifications again noted. Ectasia of the thoracic aorta noted without aneurysm. No pericardial effusion identified. A right subclavian stent is noted. A left IJ central venous catheter with tip at the superior cavoatrial junction noted. Mediastinum/Nodes: No mediastinal mass or enlarged lymph nodes. Lungs/Pleura: A small right pleural effusion noted with right lower lobe atelectasis/ scarring. No airspace disease or mass identified. There is no evidence of pneumothorax. Upper Abdomen: No acute abnormality. Musculoskeletal: No acute or suspicious focal abnormalities noted. Review of the MIP images confirms the above findings. IMPRESSION: Right upper lobe pulmonary emboli. No CT evidence of right heart strain. Small right pleural effusion and right lower lobe atelectasis/ scarring. Cardiomegaly and coronary artery disease. Critical Value/emergent results were called by telephone at the time of interpretation on 08/03/2016 at 7:52 pm to Dr. Merlyn Lot , who verbally acknowledged these results. Electronically Signed   By: Margarette Canada M.D.   On: 08/03/2016 19:56   US Venous Img Lower  Bilateral  Result Date: 08/04/2016 CLINICAL DATA:  Two-day history of chest pain and shortness of breath. Pulmonary embolus by chest CT. EXAM: BILATERAL LOWER EXTREMITY VENOUS DOPPLER ULTRASOUND TECHNIQUE: Gray-scale sonography with graded compression, as well as color Doppler and duplex ultrasound were performed to evaluate the lower extremity deep venous systems from the level of the common femoral vein and including the common femoral, femoral, profunda femoral, popliteal and calf veins including the posterior tibial, peroneal and gastrocnemius veins when visible. The superficial great saphenous vein was also interrogated. Spectral Doppler was utilized to evaluate flow at rest and with distal augmentation maneuvers in the common femoral, femoral and popliteal veins. COMPARISON:  None. FINDINGS: RIGHT LOWER EXTREMITY Common Femoral Vein: Not well seen secondary to overlying bandage material. Saphenofemoral Junction: Not well seen secondary to overlying bandage material. Profunda Femoral Vein: No evidence of thrombus. Normal compressibility and flow on color Doppler imaging. Femoral Vein: No evidence of thrombus. Normal compressibility, respiratory phasicity and response to augmentation. Popliteal Vein: No evidence of thrombus. Normal compressibility, respiratory phasicity and response to augmentation. Calf Veins: Peroneal vein is difficult to visualize. Nonocclusive thrombus cannot be entirely excluded within this vessel. Superficial Great Saphenous Vein: Patent near the femoral vein confluence. LEFT LOWER EXTREMITY Common Femoral Vein: No evidence of thrombus. Normal compressibility. Saphenofemoral Junction: No evidence of thrombus. Normal compressibility and flow on color Doppler imaging. Profunda Femoral Vein: No evidence of thrombus. Normal compressibility and flow on color Doppler imaging. Femoral Vein: Nonocclusive intraluminal thrombus identified. Popliteal Vein: No evidence of thrombus.  Normal  compressibility. Calf Veins: Not well seen in this patient status post left BKA. IMPRESSION: 1. Nonocclusive thrombus identified left femoral vein in this patient with documented history of pulmonary embolus on CT scan from yesterday. 2. Limited assessment of calf veins bilaterally. Thrombus within the posterior tibial and peroneal veins bilaterally cannot be excluded on this study. Electronically Signed  By: Misty Stanley M.D.   On: 08/04/2016 16:42     Medications:   . heparin 1,300 Units/hr (08/05/16 1315)   . aspirin  325 mg Oral [id number]  . atorvastatin  10 mg Oral QHS  . calcium acetate  667 mg Oral TID WC  . epoetin (EPOGEN/PROCRIT) injection  4,000 Units Intravenous Q M,W,F-HD  . mometasone-formoterol  2 puff Inhalation BID  . pantoprazole  40 mg Oral Daily  . pregabalin  75 mg Oral BID  . sevelamer carbonate  800 mg Oral Daily  . tiotropium  18 mcg Inhalation Daily  . vancomycin  2,000 mg Intravenous Once  . [START ON 08/07/2016] vancomycin  750 mg Intravenous Q M,W,F-HD  . warfarin  5 mg Oral ONCE-1800  . Warfarin - Pharmacist Dosing Inpatient   Does not apply q1800     Assessment/ Plan:  Mr. NOLLAN MULDROW is a 61 y.o. black male with ESRD on hemodialysis, hypertension, hyperlipidemia, anemia, COPD, peripheral vascular disease, left BKA, right toe amputations  MWF [facility].   1.  End stage renal disease -  Complication of dialysis device. Tunnel infection. Catheter removed 3/3,   - Patient had hemodialysis yesterday. He is due for dialysis again today per usual schedule. In addition he is to receive vancomycin 750 mg IV until 08/26/16.  2.  Anemia of chronic kidney disease: Hemoglobin currently 9.6. Continue Epogen 4000 units IV with dialysis.  3. Hypertension: Patient off of antihypertensives as a moment.  4. Secondary Hyperparathyroidism: - Phosphorous close to target of 5.6. Continue calcium acetate.  5. Pulm Embolism - heparin,  coumadin - Left femoral vein DVT noted. Patient on warfarin and heparin drip.     LOS: 2 [name] 3/12/20182:37 PM

## 2016-08-06 LAB — CBC
HCT: 28.6 % — ABNORMAL LOW (ref 40.0–52.0)
HEMOGLOBIN: 9.7 g/dL — AB (ref 13.0–18.0)
MCH: 31.4 pg (ref 26.0–34.0)
MCHC: 33.9 g/dL (ref 32.0–36.0)
MCV: 92.5 fL (ref 80.0–100.0)
Platelets: 234 10*3/uL (ref 150–440)
RBC: 3.09 MIL/uL — ABNORMAL LOW (ref 4.40–5.90)
RDW: 16.4 % — ABNORMAL HIGH (ref 11.5–14.5)
WBC: 9.1 10*3/uL (ref 3.8–10.6)

## 2016-08-06 LAB — HEPARIN LEVEL (UNFRACTIONATED)
HEPARIN UNFRACTIONATED: 0.3 [IU]/mL (ref 0.30–0.70)
Heparin Unfractionated: 0.51 IU/mL (ref 0.30–0.70)
Heparin Unfractionated: 0.64 IU/mL (ref 0.30–0.70)

## 2016-08-06 LAB — PROTIME-INR
INR: 1.45
PROTHROMBIN TIME: 17.8 s — AB (ref 11.4–15.2)

## 2016-08-06 MED ORDER — WARFARIN SODIUM 5 MG PO TABS: 5.0000 mg | ORAL_TABLET | Freq: Once | ORAL | Status: DC

## 2016-08-06 MED ORDER — WARFARIN SODIUM 3 MG PO TABS
6.0000 mg | ORAL_TABLET | Freq: Once | ORAL | Status: AC
  Administered 2016-08-06: 6 mg via ORAL
  Filled 2016-08-06: qty 2

## 2016-08-06 NOTE — Progress Notes (Addendum)
ANTICOAGULATION CONSULT NOTE - Follow Up Consult  Pharmacy Consult for warfarin dosing Indication: pulmonary embolus  Allergies  Allergen Reactions  . Dust Mite Extract     Patient Measurements: Height: 6\' 2"  (188 cm) Weight: 188 lb 1.6 oz (85.3 kg) IBW/kg (Calculated) : 82.2 Heparin Dosing Weight:   Vital Signs: Temp: 98.4 F (36.9 C) (03/13 0417) Temp Source: Oral (03/13 0417) BP: 100/66 (03/13 0417) Pulse Rate: 76 (03/13 0417)  Labs:  Recent Labs  08/03/16 1602 08/03/16 1657  08/04/16 0009  08/04/16 0456 08/04/16 1115 08/04/16 1257 08/05/16 0517 08/06/16 0513  HGB 11.3*  --   --   --   --  9.5*  --   --  9.6* 9.7*  HCT 33.4*  --   --   --   --  28.6*  --   --  28.8* 28.6*  PLT 232  --   --   --   --  218  --   --  236 234  APTT  --  33  --   --   --   --   --   --   --   --   LABPROT  --  13.3  --   --   --   --   --   --  14.6 17.8*  INR  --  1.01  --   --   --   --   --   --  1.13 1.45  HEPARINUNFRC  --   --   --   --   < > 0.54  --  0.49 0.37 0.30  CREATININE 9.54*  --   --   --   --  10.83*  --   --   --   --   TROPONINI 0.21*  --   < > 0.27*  --  0.38* 0.75*  --   --   --   < > = values in this interval not displayed.  Estimated Creatinine Clearance: 8.2 mL/min (by C-G formula based on SCr of 10.83 mg/dL (H)).   Medications:  Scheduled:  . aspirin  325 mg Oral BH-q7a  . atorvastatin  10 mg Oral QHS  . calcium acetate  667 mg Oral TID WC  . epoetin (EPOGEN/PROCRIT) injection  4,000 Units Intravenous Q M,W,F-HD  . mometasone-formoterol  2 puff Inhalation BID  . pantoprazole  40 mg Oral Daily  . pregabalin  75 mg Oral BID  . sevelamer carbonate  800 mg Oral Daily  . tiotropium  18 mcg Inhalation Daily  . [START ON 08/07/2016] vancomycin  750 mg Intravenous Q M,W,F-HD  . warfarin  5 mg Oral ONCE-1800  . Warfarin - Pharmacist Dosing Inpatient   Does not apply q1800    Assessment: Patient admitted for PE as shown on CT PE w/ non-occlusive  DVT Patient currently on heparin drip w/ last HL 3/13 @ 0500 0.30 and being titrated up to 1400 units/hour.  3/10 INR 1.01  3/11 INR  ??? - warfarin 5 mg 3/12 INR 1.13 - warfarin 5 mg 3/13 INR 1.45   Goal of Therapy:  INR 2-3 Monitor platelets by anticoagulation protocol: Yes   Plan:  Will give another warfarin 5 mg dose tonight 3/13 @ 1800 and recheck INR w/ am labs. Continue to monitor H&H and platelets  Thank you for this consult.  Tobie Lords, PharmD, BCPS Clinical Pharmacist 08/06/2016     08/06/16 1159 provider spoke to pharmacist and wants to increase  VKA dose due to patient frustration and desire to go home. INR 1.45 after 2 doses of VKA 5 mg. Baseline albumin low, ESRD HD patient. Provider has entered one time dose of 6 mg, which I think is reasonable. Will recheck INR in AM.  Ovid Curd A. [place], [place].D., BCPS Clinical Pharmacist

## 2016-08-06 NOTE — Progress Notes (Signed)
[facility]  ROUNDING NOTE   Subjective:  Patient had short hemodialysis yesterday.  He will be due for dialysis again tomorrow. Remains on a heparin drip at the moment.  Objective:  Vital signs in last 24 hours:  Temp:  [98 F (36.7 C)-98.6 F (37 C)] 98 F (36.7 C) (03/13 1121) Pulse Rate:  [65-83] 76 (03/13 1121) Resp:  [18-20] 18 (03/13 1121) BP: (100-133)/(66-99) 122/76 (03/13 1121) SpO2:  [92 %-99 %] 93 % (03/13 1121) Weight:  [84 kg (185 lb 3 oz)-85.3 kg (188 lb 1.6 oz)] 85.3 kg (188 lb 1.6 oz) (03/13 0417)  Weight change: -1.1 kg (-2 lb 6.8 oz) Filed Weights   08/05/16 1455 08/05/16 1711 08/06/16 0417  Weight: 84.5 kg (186 lb 4.6 oz) 84 kg (185 lb 3 oz) 85.3 kg (188 lb 1.6 oz)    Intake/Output: I/O last 3 completed shifts: In: 64 [P.O.:120; IV Piggyback:500] Out: 1000 [Other:1000]   Intake/Output this shift:  Total I/O In: 360 [P.O.:360] Out: 0   Physical Exam: General: Laying in bed  Head: Normocephalic, atraumatic. Moist oral mucosal membranes  Eyes: Anicteric  Neck: Supple, trachea midline  Lungs:  Clear to auscultation, Normal effort   Heart: Regular rate and rhythm  Abdomen:  Soft, nontender  Extremities: Left BKA, right toe amputations  Neurologic: Nonfocal, moving all four extremities  Skin: No lesions  Access: IJ PC    Basic Metabolic Panel:  Recent Labs Lab 08/03/16 1602 08/04/16 0456 08/05/16 0518  NA 136 136  --   K 4.5 4.5  --   CL 97* 100*  --   CO2 26 25  --   GLUCOSE 88 107*  --   BUN 57* 65*  --   CREATININE 9.54* 10.83*  --   CALCIUM 9.3 8.7*  --   PHOS  --   --  5.6*    Liver Function Tests:  Recent Labs Lab 08/03/16 1657  AST 51*  ALT 30  ALKPHOS 78  BILITOT 0.7  PROT 7.8  ALBUMIN 2.9*   No results for input(s): LIPASE, AMYLASE in the last 168 hours. No results for input(s): AMMONIA in the last 168 hours.  CBC:  Recent Labs Lab 08/03/16 1602 08/04/16 0456 08/05/16 0517 08/06/16 0513   WBC 9.8 8.0 8.2 9.1  HGB 11.3* 9.5* 9.6* 9.7*  HCT 33.4* 28.6* 28.8* 28.6*  MCV 93.1 94.1 94.2 92.5  PLT 232 218 236 234    Cardiac Enzymes:  Recent Labs Lab 08/03/16 1602 08/03/16 1954 08/04/16 0009 08/04/16 0456 08/04/16 1115  TROPONINI 0.21* 0.22* 0.27* 0.38* 0.75*    BNP: Invalid input(s): POCBNP  CBG:  Recent Labs Lab 07/30/16 1633 07/30/16 2135 07/31/16 0733 07/31/16 1121 08/04/16 1655  GLUCAP 113* 107* 99 84 63    Microbiology: Results for orders placed or performed during the hospital encounter of 07/26/16  Blood Culture (routine x 2)     Status: Abnormal   Collection Time: 07/26/16  8:08 AM  Result Value Ref Range Status   Specimen Description BLOOD LEFT ANTECUBITAL  Final   Special Requests BOTTLES DRAWN AEROBIC AND ANAEROBIC BCAV  Final   Culture  Setup Time   Final    GRAM POSITIVE COCCI ANAEROBIC BOTTLE ONLY CRITICAL VALUE NOTED.  VALUE IS CONSISTENT WITH PREVIOUSLY REPORTED AND CALLED VALUE.    Culture (A)  Final    STAPHYLOCOCCUS AUREUS SUSCEPTIBILITIES PERFORMED ON PREVIOUS CULTURE WITHIN THE LAST 5 DAYS. Performed at [facility], [place]  [address] [place], [place] [address]    Report Status 07/29/2016 FINAL  Final  Blood Culture (routine x 2)     Status: Abnormal   Collection Time: 07/26/16  9:57 AM  Result Value Ref Range Status   Specimen Description BLOOD LEFT ARM  Final   Special Requests BOTTLES DRAWN AEROBIC AND ANAEROBIC BCHV  Final   Culture  Setup Time   Final    GRAM POSITIVE COCCI AEROBIC BOTTLE ONLY CRITICAL RESULT CALLED TO, READ BACK BY AND VERIFIED WITH: [name] COOKSON AT 2325 07/26/16.PMH Performed at [facility], [place] [address], [place], [place] [address]    Culture METHICILLIN RESISTANT STAPHYLOCOCCUS AUREUS (A)  Final   Report Status 07/29/2016 FINAL  Final   Organism ID, Bacteria METHICILLIN RESISTANT STAPHYLOCOCCUS AUREUS  Final      Susceptibility   Methicillin resistant staphylococcus aureus - MIC*     CIPROFLOXACIN >=8 RESISTANT Resistant     ERYTHROMYCIN >=8 RESISTANT Resistant     GENTAMICIN <=0.5 SENSITIVE Sensitive     OXACILLIN >=4 RESISTANT Resistant     TETRACYCLINE <=1 SENSITIVE Sensitive     VANCOMYCIN 1 SENSITIVE Sensitive     TRIMETH/SULFA <=10 SENSITIVE Sensitive     CLINDAMYCIN RESISTANT Resistant     RIFAMPIN <=0.5 SENSITIVE Sensitive     Inducible Clindamycin POSITIVE Resistant     * METHICILLIN RESISTANT STAPHYLOCOCCUS AUREUS  Blood Culture ID Panel (Reflexed)     Status: Abnormal   Collection Time: 07/26/16  9:57 AM  Result Value Ref Range Status   Enterococcus species NOT DETECTED NOT DETECTED Final   Listeria monocytogenes NOT DETECTED NOT DETECTED Final   Staphylococcus species DETECTED (A) NOT DETECTED Final    Comment: CRITICAL RESULT CALLED TO, READ BACK BY AND VERIFIED WITH: [name] COOKSON AT 2325 07/26/16.PMH    Staphylococcus aureus DETECTED (A) NOT DETECTED Final    Comment: Methicillin (oxacillin)-resistant Staphylococcus aureus (MRSA). MRSA is predictably resistant to beta-lactam antibiotics (except ceftaroline). Preferred therapy is vancomycin unless clinically contraindicated. Patient requires contact precautions if  hospitalized. CRITICAL RESULT CALLED TO, READ BACK BY AND VERIFIED WITH: [name] COOKSON AT 2325 07/26/16.PMH    Methicillin resistance DETECTED (A) NOT DETECTED Final    Comment: CRITICAL RESULT CALLED TO, READ BACK BY AND VERIFIED WITH: [name] COOKSON AT 2325 07/26/16.PMH    Streptococcus species NOT DETECTED NOT DETECTED Final   Streptococcus agalactiae NOT DETECTED NOT DETECTED Final   Streptococcus pneumoniae NOT DETECTED NOT DETECTED Final   Streptococcus pyogenes NOT DETECTED NOT DETECTED Final   Acinetobacter baumannii NOT DETECTED NOT DETECTED Final   Enterobacteriaceae species NOT DETECTED NOT DETECTED Final   Enterobacter cloacae complex NOT DETECTED NOT DETECTED Final   Escherichia coli NOT DETECTED NOT DETECTED Final   Klebsiella  oxytoca NOT DETECTED NOT DETECTED Final   Klebsiella pneumoniae NOT DETECTED NOT DETECTED Final   Proteus species NOT DETECTED NOT DETECTED Final   Serratia marcescens NOT DETECTED NOT DETECTED Final   Haemophilus influenzae NOT DETECTED NOT DETECTED Final   Neisseria meningitidis NOT DETECTED NOT DETECTED Final   Pseudomonas aeruginosa NOT DETECTED NOT DETECTED Final   Candida albicans NOT DETECTED NOT DETECTED Final   Candida glabrata NOT DETECTED NOT DETECTED Final   Candida krusei NOT DETECTED NOT DETECTED Final   Candida parapsilosis NOT DETECTED NOT DETECTED Final   Candida tropicalis NOT DETECTED NOT DETECTED Final  MRSA PCR Screening     Status: None   Collection Time: 07/27/16  1:10 AM  Result Value  Ref Range Status   MRSA by PCR NEGATIVE NEGATIVE Final    Comment:        The GeneXpert MRSA Assay (FDA approved for NASAL specimens only), is one component of a comprehensive MRSA colonization surveillance program. It is not intended to diagnose MRSA infection nor to guide or monitor treatment for MRSA infections.   Cath Tip Culture     Status: Abnormal   Collection Time: 07/27/16 12:07 PM  Result Value Ref Range Status   Specimen Description CATH TIP  Final   Special Requests NONE  Final   Culture (A)  Final    >=100,000 COLONIES/mL METHICILLIN RESISTANT STAPHYLOCOCCUS AUREUS   Report Status 07/30/2016 FINAL  Final   Organism ID, Bacteria METHICILLIN RESISTANT STAPHYLOCOCCUS AUREUS (A)  Final      Susceptibility   Methicillin resistant staphylococcus aureus - MIC*    CIPROFLOXACIN >=8 RESISTANT Resistant     ERYTHROMYCIN >=8 RESISTANT Resistant     GENTAMICIN <=0.5 SENSITIVE Sensitive     OXACILLIN >=4 RESISTANT Resistant     TETRACYCLINE <=1 SENSITIVE Sensitive     VANCOMYCIN 1 SENSITIVE Sensitive     TRIMETH/SULFA <=10 SENSITIVE Sensitive     CLINDAMYCIN RESISTANT Resistant     RIFAMPIN <=0.5 SENSITIVE Sensitive     Inducible Clindamycin POSITIVE Resistant      * >=100,000 COLONIES/mL METHICILLIN RESISTANT STAPHYLOCOCCUS AUREUS  CULTURE, BLOOD (ROUTINE X 2) w Reflex to ID Panel     Status: None   Collection Time: 07/28/16  1:59 PM  Result Value Ref Range Status   Specimen Description BLOOD  LEFT HAND  Final   Special Requests BOTTLES DRAWN AEROBIC AND ANAEROBIC  BCAV  Final   Culture NO GROWTH 5 DAYS  Final   Report Status 08/02/2016 FINAL  Final  CULTURE, BLOOD (ROUTINE X 2) w Reflex to ID Panel     Status: None   Collection Time: 07/28/16  2:10 PM  Result Value Ref Range Status   Specimen Description BLOOD  LEFT WRIST  Final   Special Requests BOTTLES DRAWN AEROBIC AND ANAEROBIC  BCAV  Final   Culture NO GROWTH 5 DAYS  Final   Report Status 08/02/2016 FINAL  Final    Coagulation Studies:  Recent Labs  08/03/16 1657 08/05/16 0517 08/06/16 0513  LABPROT 13.3 14.6 17.8*  INR 1.01 1.13 1.45    Urinalysis: No results for input(s): COLORURINE, LABSPEC, PHURINE, GLUCOSEU, HGBUR, BILIRUBINUR, KETONESUR, PROTEINUR, UROBILINOGEN, NITRITE, LEUKOCYTESUR in the last 72 hours.  Invalid input(s): APPERANCEUR    Imaging: US Venous Img Lower Bilateral  Result Date: 08/04/2016 CLINICAL DATA:  Two-day history of chest pain and shortness of breath. Pulmonary embolus by chest CT. EXAM: BILATERAL LOWER EXTREMITY VENOUS DOPPLER ULTRASOUND TECHNIQUE: Gray-scale sonography with graded compression, as well as color Doppler and duplex ultrasound were performed to evaluate the lower extremity deep venous systems from the level of the common femoral vein and including the common femoral, femoral, profunda femoral, popliteal and calf veins including the posterior tibial, peroneal and gastrocnemius veins when visible. The superficial great saphenous vein was also interrogated. Spectral Doppler was utilized to evaluate flow at rest and with distal augmentation maneuvers in the common femoral, femoral and popliteal veins. COMPARISON:  None. FINDINGS: RIGHT LOWER  EXTREMITY Common Femoral Vein: Not well seen secondary to overlying bandage material. Saphenofemoral Junction: Not well seen secondary to overlying bandage material. Profunda Femoral Vein: No evidence of thrombus. Normal compressibility and flow on color Doppler imaging. Femoral Vein: No  evidence of thrombus. Normal compressibility, respiratory phasicity and response to augmentation. Popliteal Vein: No evidence of thrombus. Normal compressibility, respiratory phasicity and response to augmentation. Calf Veins: Peroneal vein is difficult to visualize. Nonocclusive thrombus cannot be entirely excluded within this vessel. Superficial Great Saphenous Vein: Patent near the femoral vein confluence. LEFT LOWER EXTREMITY Common Femoral Vein: No evidence of thrombus. Normal compressibility. Saphenofemoral Junction: No evidence of thrombus. Normal compressibility and flow on color Doppler imaging. Profunda Femoral Vein: No evidence of thrombus. Normal compressibility and flow on color Doppler imaging. Femoral Vein: Nonocclusive intraluminal thrombus identified. Popliteal Vein: No evidence of thrombus.  Normal compressibility. Calf Veins: Not well seen in this patient status post left BKA. IMPRESSION: 1. Nonocclusive thrombus identified left femoral vein in this patient with documented history of pulmonary embolus on CT scan from yesterday. 2. Limited assessment of calf veins bilaterally. Thrombus within the posterior tibial and peroneal veins bilaterally cannot be excluded on this study. Electronically Signed   By: Misty Stanley M.D.   On: 08/04/2016 16:42     Medications:   . heparin 1,400 Units/hr (08/06/16 0912)   . aspirin  325 mg Oral BH-q7a  . atorvastatin  10 mg Oral QHS  . calcium acetate  667 mg Oral TID WC  . epoetin (EPOGEN/PROCRIT) injection  4,000 Units Intravenous Q M,W,F-HD  . mometasone-formoterol  2 puff Inhalation BID  . pantoprazole  40 mg Oral Daily  . pregabalin  75 mg Oral BID  . sevelamer  carbonate  800 mg Oral Daily  . tiotropium  18 mcg Inhalation Daily  . [START ON 08/07/2016] vancomycin  750 mg Intravenous Q M,W,F-HD  . warfarin  6 mg Oral ONCE-1800  . Warfarin - Pharmacist Dosing Inpatient   Does not apply q1800     Assessment/ Plan:  [name] is a 61 y.o. black male with ESRD on hemodialysis, hypertension, hyperlipidemia, anemia, COPD, peripheral vascular disease, left BKA, right toe amputations  MWF [facility].   1.  End stage renal disease -  Complication of dialysis device. Tunnel infection. Catheter removed 3/3,   - Patient had hemodialysis yesterday. We will plan for hemodialysis again tomorrow.  Also continue vancomycin as previously ordered.  2.  Anemia of chronic kidney disease: Continue Epogen 4000 units IV with dialysis.  3. Hypertension: Blood pressure currently 122/76 and well-controlled.  He is currently off of antihypertensives.  4. Secondary Hyperparathyroidism: - followup phosphorus tomorrow.  Continue calcium acetate as prescribed.  5. Pulm Embolism - heparin, coumadin - Left femoral vein DVT noted. Patient on warfarin and heparin drip.     LOS: 3 Andreya Lacks 3/13/20183:34 PM

## 2016-08-06 NOTE — Progress Notes (Signed)
ANTICOAGULATION CONSULT NOTE - Initial Consult  Pharmacy Consult for Heparin  Indication: pulmonary embolus  Allergies  Allergen Reactions  . Dust Mite Extract     Patient Measurements: Height: 6\' 2"  (188 cm) Weight: 188 lb 1.6 oz (85.3 kg) IBW/kg (Calculated) : 82.2 Heparin Dosing Weight: 78 kg   Vital Signs: Temp: 98.4 F (36.9 C) (03/13 0417) Temp Source: Oral (03/13 0417) BP: 100/66 (03/13 0417) Pulse Rate: 76 (03/13 0417)  Labs:  Recent Labs  08/03/16 1602 08/03/16 1657  08/04/16 0009  08/04/16 0456 08/04/16 1115 08/04/16 1257 08/05/16 0517 08/06/16 0513  HGB 11.3*  --   --   --   --  9.5*  --   --  9.6* 9.7*  HCT 33.4*  --   --   --   --  28.6*  --   --  28.8* 28.6*  PLT 232  --   --   --   --  218  --   --  236 234  APTT  --  33  --   --   --   --   --   --   --   --   LABPROT  --  13.3  --   --   --   --   --   --  14.6 17.8*  INR  --  1.01  --   --   --   --   --   --  1.13 1.45  HEPARINUNFRC  --   --   --   --   < > 0.54  --  0.49 0.37 0.30  CREATININE 9.54*  --   --   --   --  10.83*  --   --   --   --   TROPONINI 0.21*  --   < > 0.27*  --  0.38* 0.75*  --   --   --   < > = values in this interval not displayed.  Estimated Creatinine Clearance: 8.2 mL/min (by C-G formula based on SCr of 10.83 mg/dL (H)).   Medical History: Past Medical History:  Diagnosis Date  . Anemia   . Asthma   . CHF (congestive heart failure) ([place])   . Chronic systolic heart failure ([place])   . Complication of anesthesia    hypotension  . COPD (chronic obstructive pulmonary disease) ([place])   . Coronary artery disease   . Dialysis patient ([place])    Mon, Wed, Fri  . End stage renal disease ([place])   . GERD (gastroesophageal reflux disease)   . Headache   . History of kidney stones   . HLD (hyperlipidemia)   . HTN (hypertension)   . Hyperparathyroidism   . Myocardial infarction   . Peripheral vascular disease ([place])   . Shortness of breath dyspnea   . Sleep apnea   .  Tobacco dependence     Medications:  Prescriptions Prior to Admission  Medication Sig Dispense Refill Last Dose  . acetaminophen (TYLENOL) 500 MG tablet Take 1,000 mg by mouth every 6 (six) hours as needed for moderate pain.   prn at prn  . albuterol (PROVENTIL HFA;VENTOLIN HFA) 108 (90 BASE) MCG/ACT inhaler Inhale 2 puffs into the lungs every 6 (six) hours as needed for wheezing or shortness of breath.    prn at prn  . aspirin 325 MG tablet Take 325 mg by mouth every morning.    Past Week at 0800  . atorvastatin (LIPITOR) 10 MG tablet  Take 10 mg by mouth at bedtime.    07/25/2016 at 2000  . budesonide-formoterol (SYMBICORT) 160-4.5 MCG/ACT inhaler Inhale 2 puffs into the lungs 2 (two) times daily. Reported on 08/14/2015   07/25/2016 at 2000  . calcium acetate (PHOSLO) 667 MG capsule Take 667 mg by mouth 3 (three) times daily with meals.   07/25/2016 at 2000  . Darbepoetin Alfa (ARANESP) 40 MCG/0.4ML SOSY injection Inject 0.4 mLs (40 mcg total) into the vein every Monday with hemodialysis. 8.4 mL  07/26/2016 at Unknown time  . docusate sodium (COLACE) 100 MG capsule Take 100 mg by mouth 2 (two) times daily as needed. Reported on 08/14/2015   prn at prn  . doxercalciferol (HECTOROL) 4 MCG/2ML injection Inject 1.25 mLs (2.5 mcg total) into the vein every Monday, Wednesday, and Friday with hemodialysis. 2 mL  07/26/2016 at Unknown time  . ferric gluconate 125 mg in sodium chloride 0.9 % 100 mL Inject 125 mg into the vein every Monday, Wednesday, and Friday with hemodialysis.   07/26/2016 at Unknown time  . fluticasone (FLONASE) 50 MCG/ACT nasal spray Place 1 spray into both nostrils daily.   Past Week at 0800  . ipratropium-albuterol (DUONEB) 0.5-2.5 (3) MG/3ML SOLN Take 3 mLs by nebulization every 4 (four) hours as needed (SOB).   prn at prn  . midodrine (PROAMATINE) 5 MG tablet Take 5 mg by mouth. 3 times a week, 30 minutes before dialysis   Past Week at Unknown time  . Multiple Vitamins-Minerals (MULTIVITAMIN  WITH MINERALS) tablet Take 1 tablet by mouth daily.   07/25/2016 at 2000  . multivitamin (RENA-VIT) TABS tablet Take 1 tablet by mouth at bedtime.  0 Past Week at Unknown time  . Nutritional Supplements (FEEDING SUPPLEMENT, NEPRO CARB STEADY,) LIQD Take 237 mLs by mouth daily.   07/24/2016 at Unknown time  . OxyCODONE HCl, Abuse Deter, (OXAYDO) 5 MG TABA Take 1 tablet by mouth every 8 (eight) hours as needed. 30 tablet 0 07/25/2016 at prn  . oxyCODONE-acetaminophen (PERCOCET) 7.5-325 MG tablet Take 1 tablet by mouth every 4 (four) hours as needed for severe pain. 12 tablet 0   . pantoprazole (PROTONIX) 20 MG tablet Take 20 mg by mouth daily.   07/26/2016 at 0600  . polyethylene glycol (MIRALAX / GLYCOLAX) packet Take 17 g by mouth every morning. Hold for loose stools.   Past Week at 0800  . pregabalin (LYRICA) 75 MG capsule Take 75 mg by mouth 2 (two) times daily.   07/25/2016 at 1600  . sevelamer carbonate (RENVELA) 800 MG tablet Take 800 mg by mouth daily.    Past Week at Unknown time  . simvastatin (ZOCOR) 20 MG tablet Take 20 mg by mouth daily.   07/25/2016 at 2000  . tiotropium (SPIRIVA) 18 MCG inhalation capsule Place 18 mcg into inhaler and inhale daily.   07/25/2016 at 0800  . Vancomycin (VANCOCIN) 750-5 MG/150ML-% SOLN With each dialysis, Tuesday, Thursday and Saturday. Last dose April 3rd 4000 mL      Assessment: Pharmacy consulted to dose heparin for PE in this 61 year old male.  No prior anticoag noted.  CrCl = 8.9 ml/min  Goal of Therapy:  Heparin level 0.3-0.7 units/ml Monitor platelets by anticoagulation protocol: Yes   Plan:  Will increase heparin infusion to 1400 units/hr and recheck a HL in 8 hours.   Ulice Dash D 08/06/2016,6:27 AM

## 2016-08-06 NOTE — Progress Notes (Addendum)
[facility] [place] NAME: [name]    MR#:  [id number]  DATE OF BIRTH:  1956/02/03  SUBJECTIVE:  Seen at HD Frustrated and wants to be discharged  REVIEW OF SYSTEMS:   Review of Systems  Constitutional: Negative for chills, fever and weight loss.  HENT: Negative for ear discharge, ear pain and nosebleeds.   Eyes: Negative for blurred vision, pain and discharge.  Respiratory: Negative for sputum production, shortness of breath, wheezing and stridor.   Cardiovascular: Negative for chest pain, palpitations, orthopnea and PND.  Gastrointestinal: Negative for abdominal pain, diarrhea, nausea and vomiting.  Genitourinary: Negative for frequency and urgency.  Musculoskeletal: Negative for back pain and joint pain.  Neurological: Positive for weakness. Negative for sensory change, speech change and focal weakness.  Psychiatric/Behavioral: Negative for depression and hallucinations. The patient is not nervous/anxious.    Tolerating Diet:yes Tolerating PT: pending  DRUG ALLERGIES:   Allergies  Allergen Reactions  . Dust Mite Extract     VITALS:  Blood pressure 122/76, pulse 76, temperature 98 F (36.7 C), resp. rate 18, height 6\' 2"  (1.88 m), weight 85.3 kg (188 lb 1.6 oz), SpO2 93 %.  PHYSICAL EXAMINATION:   Physical Exam  GENERAL:  61 y.o.-year-old patient lying in the bed with no acute distress.  EYES: Pupils equal, round, reactive to light and accommodation. No scleral icterus. Extraocular muscles intact.  HEENT: Head atraumatic, normocephalic. Oropharynx and nasopharynx clear.  NECK:  Supple, no jugular venous distention. No thyroid enlargement, no tenderness.  LUNGS: Normal breath sounds bilaterally, no wheezing, rales, rhonchi. No use of accessory muscles of respiration.  CARDIOVASCULAR: S1, S2 normal. No murmurs, rubs, or gallops.  ABDOMEN: Soft, nontender, nondistended. Bowel sounds present. No organomegaly or  mass.  EXTREMITIES: No cyanosis, clubbing or edema b/l.    NEUROLOGIC: Cranial nerves II through XII are intact. No focal Motor or sensory deficits b/l.   PSYCHIATRIC:  patient is alert and oriented x 3.  SKIN: No obvious rash, lesion, or ulcer.   LABORATORY PANEL:  CBC  Recent Labs Lab 08/06/16 0513  WBC 9.1  HGB 9.7*  HCT 28.6*  PLT 234    Chemistries   Recent Labs Lab 08/03/16 1657 08/04/16 0456  NA  --  136  K  --  4.5  CL  --  100*  CO2  --  25  GLUCOSE  --  107*  BUN  --  65*  CREATININE  --  10.83*  CALCIUM  --  8.7*  AST 51*  --   ALT 30  --   ALKPHOS 78  --   BILITOT 0.7  --    Cardiac Enzymes  Recent Labs Lab 08/04/16 1115  TROPONINI 0.75*   RADIOLOGY:  US Venous Img Lower Bilateral  Result Date: 08/04/2016 CLINICAL DATA:  Two-day history of chest pain and shortness of breath. Pulmonary embolus by chest CT. EXAM: BILATERAL LOWER EXTREMITY VENOUS DOPPLER ULTRASOUND TECHNIQUE: Gray-scale sonography with graded compression, as well as color Doppler and duplex ultrasound were performed to evaluate the lower extremity deep venous systems from the level of the common femoral vein and including the common femoral, femoral, profunda femoral, popliteal and calf veins including the posterior tibial, peroneal and gastrocnemius veins when visible. The superficial great saphenous vein was also interrogated. Spectral Doppler was utilized to evaluate flow at rest and with distal augmentation maneuvers in the common femoral, femoral and popliteal veins. COMPARISON:  None. FINDINGS: RIGHT  LOWER EXTREMITY Common Femoral Vein: Not well seen secondary to overlying bandage material. Saphenofemoral Junction: Not well seen secondary to overlying bandage material. Profunda Femoral Vein: No evidence of thrombus. Normal compressibility and flow on color Doppler imaging. Femoral Vein: No evidence of thrombus. Normal compressibility, respiratory phasicity and response to augmentation.  Popliteal Vein: No evidence of thrombus. Normal compressibility, respiratory phasicity and response to augmentation. Calf Veins: Peroneal vein is difficult to visualize. Nonocclusive thrombus cannot be entirely excluded within this vessel. Superficial Great Saphenous Vein: Patent near the femoral vein confluence. LEFT LOWER EXTREMITY Common Femoral Vein: No evidence of thrombus. Normal compressibility. Saphenofemoral Junction: No evidence of thrombus. Normal compressibility and flow on color Doppler imaging. Profunda Femoral Vein: No evidence of thrombus. Normal compressibility and flow on color Doppler imaging. Femoral Vein: Nonocclusive intraluminal thrombus identified. Popliteal Vein: No evidence of thrombus.  Normal compressibility. Calf Veins: Not well seen in this patient status post left BKA. IMPRESSION: 1. Nonocclusive thrombus identified left femoral vein in this patient with documented history of pulmonary embolus on CT scan from yesterday. 2. Limited assessment of calf veins bilaterally. Thrombus within the posterior tibial and peroneal veins bilaterally cannot be excluded on this study. Electronically Signed   By: Misty Stanley M.D.   On: 08/04/2016 16:42   ASSESSMENT AND PLAN:  [name]  is a 61 y.o. male who presents with Acute onset of chest pain today. He had associated shortness of breath. Here in the ED he was found on CT imaging to have a pulmonary embolus. IV heparin was started   * Acute right Pulmonary embolism (HCC) - IV heparin. CT imaging showed no evidence of right heart strain.  -IV heparin gtt -hgb stable at 9.9 -started po coumadin INR 1.45 -[place] LE shows left femoral vein DVT  *  HYPERTENSION, BENIGN - stable, continue home meds   * CAD, NATIVE VESSEL - patient's troponin was elevated, likely related to his pulmonary embolus.   *  ESRD (end stage renal disease) on dialysis [facility]) - nephrology consult for hemodialysis support   * Chronic systolic heart failure ([place]) -  continue home meds   * MRSA HD catheter related sepsis On IV vancomycin at HD (last dose 08/27/16)  Pt wanting to be discharged. Explained why he needs to be in the hospital Case discussed with Care Management/Social Worker. Management plans discussed with the patient, family and they are in agreement.  CODE STATUS: full  DVT Prophylaxis:coumadin TOTAL TIME TAKING CARE OF THIS PATIENT: 30 minutes.  >50% time spent on counselling and coordination of caredr singh POSSIBLE D/C IN 1-2 DAYS, DEPENDING ON CLINICAL CONDITION.  Note: This dictation was prepared with Dragon dictation along with smaller phrase technology. Any transcriptional errors that result from this process are unintentional.  [name] M.D on 08/06/2016 at 11:56 AM  Between 7am to 6pm - Pager - [phone]  After 6pm go to [email or web address] - password [email or web address] [facility]  Office  [phone]  CC: Primary care physician; [facility]

## 2016-08-06 NOTE — NC FL2 (Signed)
[place] LEVEL OF CARE SCREENING TOOL     IDENTIFICATION  Patient Name: [name] Birthdate: 1955/08/01 Sex: male Admission Date (Current Location): 08/03/2016  [place] and [place] Number:  [name] [id number] [place] and Address:  [facility], [address] [place], [place] [address]      Provider Number: [id number]  Attending Physician Name and Address:  Fritzi Mandes, MD  Relative Name and Phone Number:  Alfred Levins Sister [phone]     Current Level of Care: Hospital Recommended Level of Care: [place] Prior Approval Number:    Date Approved/Denied:   PASRR Number: [id number]  Discharge Plan: SNF    Current Diagnoses: Patient Active Problem List   Diagnosis Date Noted  . Pulmonary embolism ([name]) 08/03/2016  . NSTEMI (non-ST elevated myocardial infarction) ([name]) 07/26/2016  . Acute respiratory failure with hypoxia ([name]) 05/24/2016  . Right lower lobe pneumonia ([name]) 05/24/2016  . Elevated troponin 05/24/2016  . Pulmonary edema 05/21/2016  . Kidney dialysis as the cause of abnormal reaction of the patient, or of later complication, without mention of misadventure at the time of the procedure (CODE) 03/05/2016  . PVD (peripheral vascular disease) ([name]) 03/05/2016  . Preoperative cardiovascular examination 05/02/2015  . Chronic systolic heart failure ([name])   . Sepsis ([name]) 01/13/2015  . Acute encephalopathy 01/13/2015  . Hyperkalemia 01/13/2015  . Gangrene of foot ([name]) 05/04/2014  . ESRD (end stage renal disease) on dialysis ([name]) 05/04/2014  . CAD (coronary artery disease) 05/04/2014  . Normocytic anemia 05/04/2014  . Hyperlipidemia 04/26/2010  . HYPERTENSION, BENIGN 04/26/2010  . CAD, NATIVE VESSEL 04/26/2010  . End stage renal disease ([name]) 04/26/2010    Orientation RESPIRATION BLADDER Height & Weight     Self, Time, Situation, Place  Normal Continent Weight: 188 lb 1.6 oz (85.3  kg) Height:  6\' 2"  (188 cm)  BEHAVIORAL SYMPTOMS/MOOD NEUROLOGICAL BOWEL NUTRITION STATUS      Continent Diet (Renal diet fluid restriction)  AMBULATORY STATUS COMMUNICATION OF NEEDS Skin   Limited Assist Verbally Surgical wounds                       Personal Care Assistance Level of Assistance  Bathing, Feeding, Dressing Bathing Assistance: Limited assistance Feeding assistance: Independent Dressing Assistance: Limited assistance     Functional Limitations Info  Sight, Hearing, Speech Sight Info: Adequate Hearing Info: Adequate Speech Info: Adequate    SPECIAL CARE FACTORS FREQUENCY   (Hemodialysis MWF)                    Contractures Contractures Info: Not present    Additional Factors Info  Isolation Precautions Code Status Info: DNR Allergies Info:  (Dust Mite Extract)     Isolation Precautions Info: [place]     Current Medications (08/06/2016):  This is the current hospital active medication list Current Facility-Administered Medications  Medication Dose Route Frequency Provider Last Rate Last Dose  . acetaminophen (TYLENOL) tablet 650 mg  650 mg Oral Q6H PRN Lance Coon, MD       Or  . acetaminophen (TYLENOL) suppository 650 mg  650 mg Rectal Q6H PRN Lance Coon, MD      . aspirin tablet 325 mg  325 mg Oral Brigid Re, MD   325 mg at 08/06/16 0630  . atorvastatin (LIPITOR) tablet 10 mg  10 mg Oral QHS Lance Coon, MD   10 mg at 08/05/16 2127  . calcium acetate (PHOSLO) capsule  667 mg  667 mg Oral TID WC Lance Coon, MD   667 mg at 08/06/16 1223  . epoetin alfa (EPOGEN,PROCRIT) injection 4,000 Units  4,000 Units Intravenous Q M,W,F-HD Murlean Iba, MD   4,000 Units at 08/05/16 1313  . heparin ADULT infusion 100 units/mL (25000 units/237mL sodium chloride 0.45%)  1,400 Units/hr Intravenous Continuous Napoleon Form, RPH 14 mL/hr at 08/06/16 0912 1,400 Units/hr at 08/06/16 0912  . ipratropium-albuterol (DUONEB) 0.5-2.5 (3) MG/3ML nebulizer  solution 3 mL  3 mL Nebulization Q4H PRN Lance Coon, MD      . midodrine (PROAMATINE) tablet 5 mg  5 mg Oral Q dialysis Lance Coon, MD      . mometasone-formoterol [facility]) 200-5 MCG/ACT inhaler 2 puff  2 puff Inhalation BID Lance Coon, MD   2 puff at 08/06/16 0912  . ondansetron (ZOFRAN) tablet 4 mg  4 mg Oral Q6H PRN Lance Coon, MD       Or  . ondansetron [facility]) injection 4 mg  4 mg Intravenous Q6H PRN Lance Coon, MD      . oxyCODONE-acetaminophen (PERCOCET) 7.5-325 MG per tablet 1 tablet  1 tablet Oral Q4H PRN Lance Coon, MD   1 tablet at 08/04/16 0724  . pantoprazole (PROTONIX) EC tablet 40 mg  40 mg Oral Daily Lance Coon, MD   40 mg at 08/06/16 0911  . pregabalin (LYRICA) capsule 75 mg  75 mg Oral BID Lance Coon, MD   75 mg at 08/06/16 0911  . sevelamer carbonate (RENVELA) tablet 800 mg  800 mg Oral Daily Lance Coon, MD   800 mg at 08/06/16 0911  . sodium chloride (OCEAN) 0.65 % nasal spray 1 spray  1 spray Each Nare PRN Fritzi Mandes, MD      . tiotropium [facility]) inhalation capsule 18 mcg  18 mcg Inhalation Daily Lance Coon, MD   18 mcg at 08/06/16 0913  . [START ON 08/07/2016] vancomycin (VANCOCIN) IVPB 750 mg/150 ml premix  750 mg Intravenous Q M,W,F-HD Munsoor Lateef, MD      . warfarin (COUMADIN) tablet 6 mg  6 mg Oral ONCE-1800 Fritzi Mandes, MD      . Warfarin - Pharmacist Dosing Inpatient   Does not apply [place], [facility]         Discharge Medications: Please see discharge summary for a list of discharge medications.  Relevant Imaging Results:  Relevant Lab Results:   Additional Information SSN [id number]  [name], [place]

## 2016-08-06 NOTE — Clinical Social Work Note (Signed)
CSW was informed that patient is a long term care resident at [facility], and the plan is to return back to SNF once he is medically ready for discharge and orders have been received, formal assessment to follow.  [name]. Norval Morton, MSW, [place]  08/06/2016 4:35 PM

## 2016-08-06 NOTE — Care Management Important Message (Signed)
Important Message  Patient Details  Name: [name] MRN: [id number] Date of Birth: 1955-11-30   Medicare Important Message Given:  Yes  Initial signed IM printed from Epic and given to patient.      Katrina Stack, RN 08/06/2016, 12:15 PM

## 2016-08-06 NOTE — Progress Notes (Signed)
ANTICOAGULATION CONSULT NOTE - Initial Consult  Pharmacy Consult for Heparin  Indication: pulmonary embolus  Allergies  Allergen Reactions  . Dust Mite Extract     Patient Measurements: Height: 6\' 2"  (188 cm) Weight: 188 lb 1.6 oz (85.3 kg) IBW/kg (Calculated) : 82.2 Heparin Dosing Weight: 78 kg   Vital Signs: Temp: 97.8 F (36.6 C) (03/13 1956) Temp Source: Oral (03/13 1956) BP: 121/84 (03/13 1956) Pulse Rate: 75 (03/13 1956)  Labs:  Recent Labs  08/04/16 0009  08/04/16 0456 08/04/16 1115  08/05/16 0517 08/06/16 0513 08/06/16 1422 08/06/16 2138  HGB  --   < > 9.5*  --   --  9.6* 9.7*  --   --   HCT  --   --  28.6*  --   --  28.8* 28.6*  --   --   PLT  --   --  218  --   --  236 234  --   --   LABPROT  --   --   --   --   --  14.6 17.8*  --   --   INR  --   --   --   --   --  1.13 1.45  --   --   HEPARINUNFRC  --   --  0.54  --   < > 0.37 0.30 0.51 0.64  CREATININE  --   --  10.83*  --   --   --   --   --   --   TROPONINI 0.27*  --  0.38* 0.75*  --   --   --   --   --   < > = values in this interval not displayed.  Estimated Creatinine Clearance: 8.2 mL/min (by C-G formula based on SCr of 10.83 mg/dL (H)).   Medical History: Past Medical History:  Diagnosis Date  . Anemia   . Asthma   . CHF (congestive heart failure) ([place])   . Chronic systolic heart failure ([place])   . Complication of anesthesia    hypotension  . COPD (chronic obstructive pulmonary disease) ([place])   . Coronary artery disease   . Dialysis patient ([place])    Mon, Wed, Fri  . End stage renal disease ([place])   . GERD (gastroesophageal reflux disease)   . Headache   . History of kidney stones   . HLD (hyperlipidemia)   . HTN (hypertension)   . Hyperparathyroidism   . Myocardial infarction   . Peripheral vascular disease ([place])   . Shortness of breath dyspnea   . Sleep apnea   . Tobacco dependence     Medications:  Prescriptions Prior to Admission  Medication Sig Dispense Refill Last Dose   . acetaminophen (TYLENOL) 500 MG tablet Take 1,000 mg by mouth every 6 (six) hours as needed for moderate pain.   prn at prn  . albuterol (PROVENTIL HFA;VENTOLIN HFA) 108 (90 BASE) MCG/ACT inhaler Inhale 2 puffs into the lungs every 6 (six) hours as needed for wheezing or shortness of breath.    prn at prn  . aspirin 325 MG tablet Take 325 mg by mouth every morning.    Past Week at 0800  . atorvastatin (LIPITOR) 10 MG tablet Take 10 mg by mouth at bedtime.    07/25/2016 at 2000  . budesonide-formoterol (SYMBICORT) 160-4.5 MCG/ACT inhaler Inhale 2 puffs into the lungs 2 (two) times daily. Reported on 08/14/2015   07/25/2016 at 2000  . calcium acetate (PHOSLO) 667  MG capsule Take 667 mg by mouth 3 (three) times daily with meals.   07/25/2016 at 2000  . Darbepoetin Alfa (ARANESP) 40 MCG/0.4ML SOSY injection Inject 0.4 mLs (40 mcg total) into the vein every Monday with hemodialysis. 8.4 mL  07/26/2016 at Unknown time  . docusate sodium (COLACE) 100 MG capsule Take 100 mg by mouth 2 (two) times daily as needed. Reported on 08/14/2015   prn at prn  . doxercalciferol (HECTOROL) 4 MCG/2ML injection Inject 1.25 mLs (2.5 mcg total) into the vein every Monday, Wednesday, and Friday with hemodialysis. 2 mL  07/26/2016 at Unknown time  . ferric gluconate 125 mg in sodium chloride 0.9 % 100 mL Inject 125 mg into the vein every Monday, Wednesday, and Friday with hemodialysis.   07/26/2016 at Unknown time  . fluticasone (FLONASE) 50 MCG/ACT nasal spray Place 1 spray into both nostrils daily.   Past Week at 0800  . ipratropium-albuterol (DUONEB) 0.5-2.5 (3) MG/3ML SOLN Take 3 mLs by nebulization every 4 (four) hours as needed (SOB).   prn at prn  . midodrine (PROAMATINE) 5 MG tablet Take 5 mg by mouth. 3 times a week, 30 minutes before dialysis   Past Week at Unknown time  . Multiple Vitamins-Minerals (MULTIVITAMIN WITH MINERALS) tablet Take 1 tablet by mouth daily.   07/25/2016 at 2000  . multivitamin (RENA-VIT) TABS tablet Take 1  tablet by mouth at bedtime.  0 Past Week at Unknown time  . Nutritional Supplements (FEEDING SUPPLEMENT, NEPRO CARB STEADY,) LIQD Take 237 mLs by mouth daily.   07/24/2016 at Unknown time  . OxyCODONE HCl, Abuse Deter, (OXAYDO) 5 MG TABA Take 1 tablet by mouth every 8 (eight) hours as needed. 30 tablet 0 07/25/2016 at prn  . oxyCODONE-acetaminophen (PERCOCET) 7.5-325 MG tablet Take 1 tablet by mouth every 4 (four) hours as needed for severe pain. 12 tablet 0   . pantoprazole (PROTONIX) 20 MG tablet Take 20 mg by mouth daily.   07/26/2016 at 0600  . polyethylene glycol (MIRALAX / GLYCOLAX) packet Take 17 g by mouth every morning. Hold for loose stools.   Past Week at 0800  . pregabalin (LYRICA) 75 MG capsule Take 75 mg by mouth 2 (two) times daily.   07/25/2016 at 1600  . sevelamer carbonate (RENVELA) 800 MG tablet Take 800 mg by mouth daily.    Past Week at Unknown time  . simvastatin (ZOCOR) 20 MG tablet Take 20 mg by mouth daily.   07/25/2016 at 2000  . tiotropium (SPIRIVA) 18 MCG inhalation capsule Place 18 mcg into inhaler and inhale daily.   07/25/2016 at 0800  . Vancomycin (VANCOCIN) 750-5 MG/150ML-% SOLN With each dialysis, Tuesday, Thursday and Saturday. Last dose April 3rd 4000 mL      Assessment: Pharmacy consulted to dose heparin for PE in this 61 year old male.  No prior anticoag noted.  CrCl = 8.9 ml/min  Goal of Therapy:  Heparin level 0.3-0.7 units/ml Monitor platelets by anticoagulation protocol: Yes   Plan:  Heparin level therapeutic x 1 from dose change. Continue current rate. Will recheck HL in 8 hours.   3/13:  HL @ 22:00 = 0.64 Will continue this pt on current rate and recheck HL on 3/14 with AM labs.   Brittain Hosie D, Pharm.D.,  Clinical Pharmacist 08/06/2016,10:14 PM

## 2016-08-06 NOTE — Progress Notes (Signed)
Pharmacy Note and order indicated different 1800 dosages. Call pharmacy and spoke with Pharamcist, Corene Cornea- patient's INR still low, states to administer the 6mg  of coumadin as ordered.

## 2016-08-06 NOTE — Progress Notes (Signed)
ANTICOAGULATION CONSULT NOTE - Initial Consult  Pharmacy Consult for Heparin  Indication: pulmonary embolus  Allergies  Allergen Reactions  . Dust Mite Extract     Patient Measurements: Height: 6\' 2"  (188 cm) Weight: 188 lb 1.6 oz (85.3 kg) IBW/kg (Calculated) : 82.2 Heparin Dosing Weight: 78 kg   Vital Signs: Temp: 98 F (36.7 C) (03/13 1121) Temp Source: Oral (03/13 0921) BP: 122/76 (03/13 1121) Pulse Rate: 76 (03/13 1121)  Labs:  Recent Labs  08/03/16 1602 08/03/16 1657  08/04/16 0009 08/04/16 0456 08/04/16 1115  08/05/16 0517 08/06/16 0513 08/06/16 1422  HGB 11.3*  --   --   --  9.5*  --   --  9.6* 9.7*  --   HCT 33.4*  --   --   --  28.6*  --   --  28.8* 28.6*  --   PLT 232  --   --   --  218  --   --  236 234  --   APTT  --  33  --   --   --   --   --   --   --   --   LABPROT  --  13.3  --   --   --   --   --  14.6 17.8*  --   INR  --  1.01  --   --   --   --   --  1.13 1.45  --   HEPARINUNFRC  --   --   --   --  0.54  --   < > 0.37 0.30 0.51  CREATININE 9.54*  --   --   --  10.83*  --   --   --   --   --   TROPONINI 0.21*  --   < > 0.27* 0.38* 0.75*  --   --   --   --   < > = values in this interval not displayed.  Estimated Creatinine Clearance: 8.2 mL/min (by C-G formula based on SCr of 10.83 mg/dL (H)).   Medical History: Past Medical History:  Diagnosis Date  . Anemia   . Asthma   . CHF (congestive heart failure) ([place])   . Chronic systolic heart failure ([place])   . Complication of anesthesia    hypotension  . COPD (chronic obstructive pulmonary disease) ([place])   . Coronary artery disease   . Dialysis patient ([place])    Mon, Wed, Fri  . End stage renal disease ([place])   . GERD (gastroesophageal reflux disease)   . Headache   . History of kidney stones   . HLD (hyperlipidemia)   . HTN (hypertension)   . Hyperparathyroidism   . Myocardial infarction   . Peripheral vascular disease ([place])   . Shortness of breath dyspnea   . Sleep apnea   .  Tobacco dependence     Medications:  Prescriptions Prior to Admission  Medication Sig Dispense Refill Last Dose  . acetaminophen (TYLENOL) 500 MG tablet Take 1,000 mg by mouth every 6 (six) hours as needed for moderate pain.   prn at prn  . albuterol (PROVENTIL HFA;VENTOLIN HFA) 108 (90 BASE) MCG/ACT inhaler Inhale 2 puffs into the lungs every 6 (six) hours as needed for wheezing or shortness of breath.    prn at prn  . aspirin 325 MG tablet Take 325 mg by mouth every morning.    Past Week at 0800  . atorvastatin (LIPITOR) 10 MG tablet  Take 10 mg by mouth at bedtime.    07/25/2016 at 2000  . budesonide-formoterol (SYMBICORT) 160-4.5 MCG/ACT inhaler Inhale 2 puffs into the lungs 2 (two) times daily. Reported on 08/14/2015   07/25/2016 at 2000  . calcium acetate (PHOSLO) 667 MG capsule Take 667 mg by mouth 3 (three) times daily with meals.   07/25/2016 at 2000  . Darbepoetin Alfa (ARANESP) 40 MCG/0.4ML SOSY injection Inject 0.4 mLs (40 mcg total) into the vein every Monday with hemodialysis. 8.4 mL  07/26/2016 at Unknown time  . docusate sodium (COLACE) 100 MG capsule Take 100 mg by mouth 2 (two) times daily as needed. Reported on 08/14/2015   prn at prn  . doxercalciferol (HECTOROL) 4 MCG/2ML injection Inject 1.25 mLs (2.5 mcg total) into the vein every Monday, Wednesday, and Friday with hemodialysis. 2 mL  07/26/2016 at Unknown time  . ferric gluconate 125 mg in sodium chloride 0.9 % 100 mL Inject 125 mg into the vein every Monday, Wednesday, and Friday with hemodialysis.   07/26/2016 at Unknown time  . fluticasone (FLONASE) 50 MCG/ACT nasal spray Place 1 spray into both nostrils daily.   Past Week at 0800  . ipratropium-albuterol (DUONEB) 0.5-2.5 (3) MG/3ML SOLN Take 3 mLs by nebulization every 4 (four) hours as needed (SOB).   prn at prn  . midodrine (PROAMATINE) 5 MG tablet Take 5 mg by mouth. 3 times a week, 30 minutes before dialysis   Past Week at Unknown time  . Multiple Vitamins-Minerals (MULTIVITAMIN  WITH MINERALS) tablet Take 1 tablet by mouth daily.   07/25/2016 at 2000  . multivitamin (RENA-VIT) TABS tablet Take 1 tablet by mouth at bedtime.  0 Past Week at Unknown time  . Nutritional Supplements (FEEDING SUPPLEMENT, NEPRO CARB STEADY,) LIQD Take 237 mLs by mouth daily.   07/24/2016 at Unknown time  . OxyCODONE HCl, Abuse Deter, (OXAYDO) 5 MG TABA Take 1 tablet by mouth every 8 (eight) hours as needed. 30 tablet 0 07/25/2016 at prn  . oxyCODONE-acetaminophen (PERCOCET) 7.5-325 MG tablet Take 1 tablet by mouth every 4 (four) hours as needed for severe pain. 12 tablet 0   . pantoprazole (PROTONIX) 20 MG tablet Take 20 mg by mouth daily.   07/26/2016 at 0600  . polyethylene glycol (MIRALAX / GLYCOLAX) packet Take 17 g by mouth every morning. Hold for loose stools.   Past Week at 0800  . pregabalin (LYRICA) 75 MG capsule Take 75 mg by mouth 2 (two) times daily.   07/25/2016 at 1600  . sevelamer carbonate (RENVELA) 800 MG tablet Take 800 mg by mouth daily.    Past Week at Unknown time  . simvastatin (ZOCOR) 20 MG tablet Take 20 mg by mouth daily.   07/25/2016 at 2000  . tiotropium (SPIRIVA) 18 MCG inhalation capsule Place 18 mcg into inhaler and inhale daily.   07/25/2016 at 0800  . Vancomycin (VANCOCIN) 750-5 MG/150ML-% SOLN With each dialysis, Tuesday, Thursday and Saturday. Last dose April 3rd 4000 mL      Assessment: Pharmacy consulted to dose heparin for PE in this 61 year old male.  No prior anticoag noted.  CrCl = 8.9 ml/min  Goal of Therapy:  Heparin level 0.3-0.7 units/ml Monitor platelets by anticoagulation protocol: Yes   Plan:  Heparin level therapeutic x 1 from dose change. Continue current rate. Will recheck HL in 8 hours.   Laural Benes, Pharm.D., BCPS Clinical Pharmacist 08/06/2016,3:27 PM

## 2016-08-06 NOTE — Plan of Care (Signed)
Problem: Coping: Goal: Development of coping mechanisms to deal with changes in body function or appearance will improve Outcome: Not Progressing Patient having a difficult time with hospitalization, support provided  Problem: Nutritional: Goal: Ability to make healthy dietary choices will improve Outcome: Progressing Patient abiding by Fluid restriction and monitoring intake this shift  Problem: Physical Regulation: Goal: Complications related to the disease process, condition or treatment will be avoided or minimized  Patient aware of complications and need to manage all aspects of illess prior to leaving the acute care setting

## 2016-08-07 LAB — CBC
HCT: 28.5 % — ABNORMAL LOW (ref 40.0–52.0)
HEMOGLOBIN: 9.6 g/dL — AB (ref 13.0–18.0)
MCH: 31.4 pg (ref 26.0–34.0)
MCHC: 33.6 g/dL (ref 32.0–36.0)
MCV: 93.3 fL (ref 80.0–100.0)
PLATELETS: 242 10*3/uL (ref 150–440)
RBC: 3.06 MIL/uL — ABNORMAL LOW (ref 4.40–5.90)
RDW: 16.6 % — AB (ref 11.5–14.5)
WBC: 9.3 10*3/uL (ref 3.8–10.6)

## 2016-08-07 LAB — PHOSPHORUS: Phosphorus: 6 mg/dL — ABNORMAL HIGH (ref 2.5–4.6)

## 2016-08-07 LAB — HEPARIN LEVEL (UNFRACTIONATED): HEPARIN UNFRACTIONATED: 0.55 [IU]/mL (ref 0.30–0.70)

## 2016-08-07 LAB — PROTIME-INR
INR: 2.18
Prothrombin Time: 24.6 seconds — ABNORMAL HIGH (ref 11.4–15.2)

## 2016-08-07 LAB — VANCOMYCIN, RANDOM: VANCOMYCIN RM: 25

## 2016-08-07 MED ORDER — WARFARIN SODIUM 5 MG PO TABS: 5.0000 mg | ORAL_TABLET | Freq: Every day | ORAL | Status: DC

## 2016-08-07 MED ORDER — WARFARIN SODIUM 5 MG PO TABS: 5.0000 mg | ORAL_TABLET | Freq: Every day | ORAL | 1 refills | Status: DC

## 2016-08-07 MED ORDER — SEVELAMER CARBONATE 800 MG PO TABS
800.0000 mg | ORAL_TABLET | Freq: Three times a day (TID) | ORAL | Status: DC
Start: 2016-08-07 — End: 2016-08-07
  Administered 2016-08-07: 800 mg via ORAL
  Filled 2016-08-07 (×3): qty 1

## 2016-08-07 NOTE — Discharge Instructions (Signed)
Nursing home MD at [facility] to check PT/INR and adjust coumadin dose accordingly

## 2016-08-07 NOTE — Care Management (Signed)
Chronic HD patient known to patient pathways.  Agency was not aware of admission. Sent medical record information.

## 2016-08-07 NOTE — Progress Notes (Signed)
Post HD assessment unchanged  

## 2016-08-07 NOTE — Progress Notes (Signed)
Pre HD  

## 2016-08-07 NOTE — Progress Notes (Signed)
ANTICOAGULATION CONSULT NOTE - Initial Consult  Pharmacy Consult for Heparin  Indication: pulmonary embolus      Allergies  Allergen Reactions  . Dust Mite Extract     Patient Measurements: Height: 6\' 2"  (188 cm) Weight: 188 lb 1.6 oz (85.3 kg) IBW/kg (Calculated) : 82.2 Heparin Dosing Weight: 78 kg   Vital Signs: Temp: 97.8 F (36.6 C) (03/13 1956) Temp Source: Oral (03/13 1956) BP: 121/84 (03/13 1956) Pulse Rate: 75 (03/13 1956)  Labs:  Recent Labs (last 2 labs)    Recent Labs  08/04/16 0009  08/04/16 0456 08/04/16 1115  08/05/16 0517 08/06/16 0513 08/06/16 1422 08/06/16 2138  HGB  --   < > 9.5*  --   --  9.6* 9.7*  --   --   HCT  --   --  28.6*  --   --  28.8* 28.6*  --   --   PLT  --   --  218  --   --  236 234  --   --   LABPROT  --   --   --   --   --  14.6 17.8*  --   --   INR  --   --   --   --   --  1.13 1.45  --   --   HEPARINUNFRC  --   --  0.54  --   < > 0.37 0.30 0.51 0.64  CREATININE  --   --  10.83*  --   --   --   --   --   --   TROPONINI 0.27*  --  0.38* 0.75*  --   --   --   --   --   < > = values in this interval not displayed.    Estimated Creatinine Clearance: 8.2 mL/min (by C-G formula based on SCr of 10.83 mg/dL (H)).   Medical History:     Past Medical History:  Diagnosis Date  . Anemia   . Asthma   . CHF (congestive heart failure) ([place])   . Chronic systolic heart failure ([place])   . Complication of anesthesia    hypotension  . COPD (chronic obstructive pulmonary disease) ([place])   . Coronary artery disease   . Dialysis patient ([place])    Mon, Wed, Fri  . End stage renal disease ([place])   . GERD (gastroesophageal reflux disease)   . Headache   . History of kidney stones   . HLD (hyperlipidemia)   . HTN (hypertension)   . Hyperparathyroidism   . Myocardial infarction   . Peripheral vascular disease ([place])   . Shortness of breath dyspnea   . Sleep apnea   . Tobacco dependence      Medications:         Prescriptions Prior to Admission  Medication Sig Dispense Refill Last Dose  . acetaminophen (TYLENOL) 500 MG tablet Take 1,000 mg by mouth every 6 (six) hours as needed for moderate pain.   prn at prn  . albuterol (PROVENTIL HFA;VENTOLIN HFA) 108 (90 BASE) MCG/ACT inhaler Inhale 2 puffs into the lungs every 6 (six) hours as needed for wheezing or shortness of breath.    prn at prn  . aspirin 325 MG tablet Take 325 mg by mouth every morning.    Past Week at 0800  . atorvastatin (LIPITOR) 10 MG tablet Take 10 mg by mouth at bedtime.    07/25/2016 at 2000  . budesonide-formoterol (SYMBICORT) 160-4.5 MCG/ACT  inhaler Inhale 2 puffs into the lungs 2 (two) times daily. Reported on 08/14/2015   07/25/2016 at 2000  . calcium acetate (PHOSLO) 667 MG capsule Take 667 mg by mouth 3 (three) times daily with meals.   07/25/2016 at 2000  . Darbepoetin Alfa (ARANESP) 40 MCG/0.4ML SOSY injection Inject 0.4 mLs (40 mcg total) into the vein every Monday with hemodialysis. 8.4 mL  07/26/2016 at Unknown time  . docusate sodium (COLACE) 100 MG capsule Take 100 mg by mouth 2 (two) times daily as needed. Reported on 08/14/2015   prn at prn  . doxercalciferol (HECTOROL) 4 MCG/2ML injection Inject 1.25 mLs (2.5 mcg total) into the vein every Monday, Wednesday, and Friday with hemodialysis. 2 mL  07/26/2016 at Unknown time  . ferric gluconate 125 mg in sodium chloride 0.9 % 100 mL Inject 125 mg into the vein every Monday, Wednesday, and Friday with hemodialysis.   07/26/2016 at Unknown time  . fluticasone (FLONASE) 50 MCG/ACT nasal spray Place 1 spray into both nostrils daily.   Past Week at 0800  . ipratropium-albuterol (DUONEB) 0.5-2.5 (3) MG/3ML SOLN Take 3 mLs by nebulization every 4 (four) hours as needed (SOB).   prn at prn  . midodrine (PROAMATINE) 5 MG tablet Take 5 mg by mouth. 3 times a week, 30 minutes before dialysis   Past Week at Unknown time  . Multiple Vitamins-Minerals  (MULTIVITAMIN WITH MINERALS) tablet Take 1 tablet by mouth daily.   07/25/2016 at 2000  . multivitamin (RENA-VIT) TABS tablet Take 1 tablet by mouth at bedtime.  0 Past Week at Unknown time  . Nutritional Supplements (FEEDING SUPPLEMENT, NEPRO CARB STEADY,) LIQD Take 237 mLs by mouth daily.   07/24/2016 at Unknown time  . OxyCODONE HCl, Abuse Deter, (OXAYDO) 5 MG TABA Take 1 tablet by mouth every 8 (eight) hours as needed. 30 tablet 0 07/25/2016 at prn  . oxyCODONE-acetaminophen (PERCOCET) 7.5-325 MG tablet Take 1 tablet by mouth every 4 (four) hours as needed for severe pain. 12 tablet 0   . pantoprazole (PROTONIX) 20 MG tablet Take 20 mg by mouth daily.   07/26/2016 at 0600  . polyethylene glycol (MIRALAX / GLYCOLAX) packet Take 17 g by mouth every morning. Hold for loose stools.   Past Week at 0800  . pregabalin (LYRICA) 75 MG capsule Take 75 mg by mouth 2 (two) times daily.   07/25/2016 at 1600  . sevelamer carbonate (RENVELA) 800 MG tablet Take 800 mg by mouth daily.    Past Week at Unknown time  . simvastatin (ZOCOR) 20 MG tablet Take 20 mg by mouth daily.   07/25/2016 at 2000  . tiotropium (SPIRIVA) 18 MCG inhalation capsule Place 18 mcg into inhaler and inhale daily.   07/25/2016 at 0800  . Vancomycin (VANCOCIN) 750-5 MG/150ML-% SOLN With each dialysis, Tuesday, Thursday and Saturday. Last dose April 3rd 4000 mL      Assessment: Pharmacy consulted to dose heparin for PE in this 61 year old male.  No prior anticoag noted.  CrCl = 8.9 ml/min  Goal of Therapy:  Heparin level 0.3-0.7 units/ml Monitor platelets by anticoagulation protocol: Yes   Plan:  Heparin level therapeutic x 1 from dose change. Continue current rate. Will recheck HL in 8 hours.   3/13:  HL @ 22:00 = 0.64 Will continue this pt on current rate and recheck HL on 3/14 with AM labs.   3/14: HL @ 0500: 0.55 was drawn 1.5 hours too late, but will continue current rate  as HL is within goal range. Will recheck  HL 3/14 @ 1100.  Thank you for this consult.  Tobie Lords, PharmD, BCPS Clinical Pharmacist 08/07/2016

## 2016-08-07 NOTE — Progress Notes (Signed)
Report called to [place] at [facility].

## 2016-08-07 NOTE — Care Management (Signed)
[name], [name] #[id number] (CSN: [id number]) (61 y.o. M) (Adm: 08/03/16)  [id number]  PCP   [place]  Demographics  Comment    Last edited by  on at   Address: Home Phone: Work Phone: Mobile Phone:  [place] [address]   [phone] -- [phone]  SSN: Insurance: Marital Status: Religion:  [id number] MEDICARE [employer]  Patient Ethnicity & Race   Ethnic Group Patient Race  Not Hispanic or Latino Black or African American  Documents Filed to Patient   Power of Attorney Living Will Clinical Unknown Study Attachment Consent Form ABN Waiver After Visit Summary Lab Result Scan Code Status MyChart Status Advance Care Planning  Not on File Not on File Not on File Not on File Filed Not on [place] DNR [Updated on 08/03/16 2319] Pending [facility] to the [facility]  Admission Information   Attending Provider Admitting Provider Admission Type Admission Date/Time  Fritzi Mandes, MD Lance Coon, MD Emergency 08/03/16 1636  Discharge Date Hospital Service Auth/Cert [place]   Internal Medicine Incomplete [place]  Unit Room/Bed Admission Status   [facility]LE[facility] (2A) 256A/256A-AA Admission (Confirmed)   Hospital Account   Name Acct ID Class Status Primary Coverage  [name], [name] [id number] Inpatient Open MEDICARE - MEDICARE PART A AND B      Guarantor Account (for [facility] [id number])   Name Relation to Pt Service Area Active? Acct Type  [name] Self [facility] Yes Personal/Family  Address Phone    [address] [place], [place] [address] [phone])        Coverage Information (for Hospital Account [id number])   1. [place] PART A AND B   F/O Payor/Plan Precert #  MEDICARE/MEDICARE PART A AND B   Subscriber Subscriber #  [name], [name] [id number]  Address Phone  [address] [place], [place] [address]   2. MEDICAID [place]/MEDICAID OF [place]   F/O Payor/Plan Precert #   MEDICAID [place]/MEDICAID OF    Subscriber Subscriber #  [name], [name] [id number]  Address Phone  [address] [place] [place],  [address] [phone]

## 2016-08-07 NOTE — Clinical Social Work Note (Signed)
Clinical Social Work Assessment  Patient Details  Name: [name] MRN: [id number] Date of Birth: 03-Feb-1956  Date of referral:  08/07/16               Reason for consult:  Facility Placement                Permission sought to share information with:  Facility Sport and exercise psychologist, Family Supports Permission granted to share information::  Yes, Verbal Permission Granted  Name::     McAdams,Lois A Sister [phone]   Agency::  SNF admissions  Relationship::     Contact Information:     Housing/Transportation Living arrangements for the past 2 months:  [place] of Information:  Patient, Medical Team Patient Interpreter Needed:  None Criminal Activity/Legal Involvement Pertinent to Current Situation/Hospitalization:  No - Comment as needed Significant Relationships:  Siblings Lives with:  Facility Resident Do you feel safe going back to the place where you live?  Yes Need for family participation in patient care:  No (Coment)  Care giving concerns:  Patient does not have any concerns about care at [facility].   Social Worker assessment / plan:  Patient is a 61 year old male who is alert and oriented x4 and able to express his feelings.  Patient has been at [facility] for a couple of months.  Patient did not express any concerns about returning back to SNF.  Patient is aware of the process of returning back to SNF, patient does not have any questions.  Employment status:  Retired Forensic scientist:  Medicare PT Recommendations:  Not assessed at this time [place] / Referral to community resources:  [place]  Patient/Family's Response to care:  Patient is in agreement to returning back to SNF.  Patient/Family's Understanding of and Emotional Response to Diagnosis, Current Treatment, and Prognosis:  Patient is aware of current treatment and prognosis.  Emotional Assessment Appearance:  Appears stated  age Attitude/Demeanor/Rapport:    Affect (typically observed):  Appropriate, Calm, Pleasant Orientation:  Oriented to Self, Oriented to Place, Oriented to Situation, Oriented to  Time Alcohol / Substance use:  Never Used Psych involvement (Current and /or in the community):  No (Comment)  Discharge Needs  Concerns to be addressed:  No discharge needs identified Readmission within the last 30 days:  Yes (07/31/16 to [facility]) Current discharge risk:  Chronically ill Barriers to Discharge:  No Barriers Identified   Ross Ludwig, LCSWA 08/07/2016, 4:15 PM

## 2016-08-07 NOTE — Progress Notes (Signed)
[facility]  ROUNDING NOTE   Subjective:  Patient seen and evaluated during hemodialysis. He appears to be tolerating this quite well. INR now at target.  Objective:  Vital signs in last 24 hours:  Temp:  [97.6 F (36.4 C)-97.9 F (36.6 C)] 97.6 F (36.4 C) (03/14 1015) Pulse Rate:  [67-81] 81 (03/14 1100) Resp:  [10-18] 18 (03/14 1100) BP: (110-129)/(77-87) 110/80 (03/14 1100) SpO2:  [93 %-99 %] 99 % (03/14 1030) Weight:  [86.1 kg (189 lb 14.4 oz)-86.5 kg (190 lb 11.2 oz)] 86.5 kg (190 lb 11.2 oz) (03/14 1015)  Weight change: 1.638 kg (3 lb 9.8 oz) Filed Weights   08/06/16 0417 08/07/16 0428 08/07/16 1015  Weight: 85.3 kg (188 lb 1.6 oz) 86.1 kg (189 lb 14.4 oz) 86.5 kg (190 lb 11.2 oz)    Intake/Output: I/O last 3 completed shifts: In: 70 [P.O.:682] Out: 0    Intake/Output this shift:  No intake/output data recorded.  Physical Exam: General: Laying in bed  Head: Normocephalic, atraumatic. Moist oral mucosal membranes  Eyes: Anicteric  Neck: Supple, trachea midline  Lungs:  Clear to auscultation, Normal effort   Heart: Regular rate and rhythm  Abdomen:  Soft, nontender  Extremities: Left BKA, right toe amputations  Neurologic: Nonfocal, moving all four extremities  Skin: No lesions  Access: IJ PC    Basic Metabolic Panel:  Recent Labs Lab 08/03/16 1602 08/04/16 0456 08/05/16 0518  NA 136 136  --   K 4.5 4.5  --   CL 97* 100*  --   CO2 26 25  --   GLUCOSE 88 107*  --   BUN 57* 65*  --   CREATININE 9.54* 10.83*  --   CALCIUM 9.3 8.7*  --   PHOS  --   --  5.6*    Liver Function Tests:  Recent Labs Lab 08/03/16 1657  AST 51*  ALT 30  ALKPHOS 78  BILITOT 0.7  PROT 7.8  ALBUMIN 2.9*   No results for input(s): LIPASE, AMYLASE in the last 168 hours. No results for input(s): AMMONIA in the last 168 hours.  CBC:  Recent Labs Lab 08/03/16 1602 08/04/16 0456 08/05/16 0517 08/06/16 0513 08/07/16 0455  WBC 9.8 8.0 8.2 9.1 9.3   HGB 11.3* 9.5* 9.6* 9.7* 9.6*  HCT 33.4* 28.6* 28.8* 28.6* 28.5*  MCV 93.1 94.1 94.2 92.5 93.3  PLT 232 218 236 234 242    Cardiac Enzymes:  Recent Labs Lab 08/03/16 1602 08/03/16 1954 08/04/16 0009 08/04/16 0456 08/04/16 1115  TROPONINI 0.21* 0.22* 0.27* 0.38* 0.75*    BNP: Invalid input(s): POCBNP  CBG:  Recent Labs Lab 07/31/16 1121 08/04/16 1655  GLUCAP 84 28    Microbiology: Results for orders placed or performed during the hospital encounter of 07/26/16  Blood Culture (routine x 2)     Status: Abnormal   Collection Time: 07/26/16  8:08 AM  Result Value Ref Range Status   Specimen Description BLOOD LEFT ANTECUBITAL  Final   Special Requests BOTTLES DRAWN AEROBIC AND ANAEROBIC BCAV  Final   Culture  Setup Time   Final    GRAM POSITIVE COCCI ANAEROBIC BOTTLE ONLY CRITICAL VALUE NOTED.  VALUE IS CONSISTENT WITH PREVIOUSLY REPORTED AND CALLED VALUE.    Culture (A)  Final    STAPHYLOCOCCUS AUREUS SUSCEPTIBILITIES PERFORMED ON PREVIOUS CULTURE WITHIN THE LAST 5 DAYS. Performed at [facility], [place] [address], [place], [place] [address]    Report Status 07/29/2016 FINAL  Final  Blood Culture (routine x 2)     Status: Abnormal   Collection Time: 07/26/16  9:57 AM  Result Value Ref Range Status   Specimen Description BLOOD LEFT ARM  Final   Special Requests BOTTLES DRAWN AEROBIC AND ANAEROBIC BCHV  Final   Culture  Setup Time   Final    GRAM POSITIVE COCCI AEROBIC BOTTLE ONLY CRITICAL RESULT CALLED TO, READ BACK BY AND VERIFIED WITH: [name] COOKSON AT 2325 07/26/16.PMH Performed at [facility], [place] [address] [place], [place] [address]    Culture METHICILLIN RESISTANT STAPHYLOCOCCUS AUREUS (A)  Final   Report Status 07/29/2016 FINAL  Final   Organism ID, Bacteria METHICILLIN RESISTANT STAPHYLOCOCCUS AUREUS  Final      Susceptibility   Methicillin resistant staphylococcus aureus - MIC*    CIPROFLOXACIN >=8 RESISTANT Resistant     ERYTHROMYCIN  >=8 RESISTANT Resistant     GENTAMICIN <=0.5 SENSITIVE Sensitive     OXACILLIN >=4 RESISTANT Resistant     TETRACYCLINE <=1 SENSITIVE Sensitive     VANCOMYCIN 1 SENSITIVE Sensitive     TRIMETH/SULFA <=10 SENSITIVE Sensitive     CLINDAMYCIN RESISTANT Resistant     RIFAMPIN <=0.5 SENSITIVE Sensitive     Inducible Clindamycin POSITIVE Resistant     * METHICILLIN RESISTANT STAPHYLOCOCCUS AUREUS  Blood Culture ID Panel (Reflexed)     Status: Abnormal   Collection Time: 07/26/16  9:57 AM  Result Value Ref Range Status   Enterococcus species NOT DETECTED NOT DETECTED Final   Listeria monocytogenes NOT DETECTED NOT DETECTED Final   Staphylococcus species DETECTED (A) NOT DETECTED Final    Comment: CRITICAL RESULT CALLED TO, READ BACK BY AND VERIFIED WITH: [name] COOKSON AT 2325 07/26/16.PMH    Staphylococcus aureus DETECTED (A) NOT DETECTED Final    Comment: Methicillin (oxacillin)-resistant Staphylococcus aureus (MRSA). MRSA is predictably resistant to beta-lactam antibiotics (except ceftaroline). Preferred therapy is vancomycin unless clinically contraindicated. Patient requires contact precautions if  hospitalized. CRITICAL RESULT CALLED TO, READ BACK BY AND VERIFIED WITH: [name] COOKSON AT 2325 07/26/16.PMH    Methicillin resistance DETECTED (A) NOT DETECTED Final    Comment: CRITICAL RESULT CALLED TO, READ BACK BY AND VERIFIED WITH: [name] COOKSON AT 2325 07/26/16.PMH    Streptococcus species NOT DETECTED NOT DETECTED Final   Streptococcus agalactiae NOT DETECTED NOT DETECTED Final   Streptococcus pneumoniae NOT DETECTED NOT DETECTED Final   Streptococcus pyogenes NOT DETECTED NOT DETECTED Final   Acinetobacter baumannii NOT DETECTED NOT DETECTED Final   Enterobacteriaceae species NOT DETECTED NOT DETECTED Final   Enterobacter cloacae complex NOT DETECTED NOT DETECTED Final   Escherichia coli NOT DETECTED NOT DETECTED Final   Klebsiella oxytoca NOT DETECTED NOT DETECTED Final   Klebsiella  pneumoniae NOT DETECTED NOT DETECTED Final   Proteus species NOT DETECTED NOT DETECTED Final   Serratia marcescens NOT DETECTED NOT DETECTED Final   Haemophilus influenzae NOT DETECTED NOT DETECTED Final   Neisseria meningitidis NOT DETECTED NOT DETECTED Final   Pseudomonas aeruginosa NOT DETECTED NOT DETECTED Final   Candida albicans NOT DETECTED NOT DETECTED Final   Candida glabrata NOT DETECTED NOT DETECTED Final   Candida krusei NOT DETECTED NOT DETECTED Final   Candida parapsilosis NOT DETECTED NOT DETECTED Final   Candida tropicalis NOT DETECTED NOT DETECTED Final  MRSA PCR Screening     Status: None   Collection Time: 07/27/16  1:10 AM  Result Value Ref Range Status   MRSA by PCR NEGATIVE NEGATIVE Final  Comment:        The GeneXpert MRSA Assay (FDA approved for NASAL specimens only), is one component of a comprehensive MRSA colonization surveillance program. It is not intended to diagnose MRSA infection nor to guide or monitor treatment for MRSA infections.   Cath Tip Culture     Status: Abnormal   Collection Time: 07/27/16 12:07 PM  Result Value Ref Range Status   Specimen Description CATH TIP  Final   Special Requests NONE  Final   Culture (A)  Final    >=100,000 COLONIES/mL METHICILLIN RESISTANT STAPHYLOCOCCUS AUREUS   Report Status 07/30/2016 FINAL  Final   Organism ID, Bacteria METHICILLIN RESISTANT STAPHYLOCOCCUS AUREUS (A)  Final      Susceptibility   Methicillin resistant staphylococcus aureus - MIC*    CIPROFLOXACIN >=8 RESISTANT Resistant     ERYTHROMYCIN >=8 RESISTANT Resistant     GENTAMICIN <=0.5 SENSITIVE Sensitive     OXACILLIN >=4 RESISTANT Resistant     TETRACYCLINE <=1 SENSITIVE Sensitive     VANCOMYCIN 1 SENSITIVE Sensitive     TRIMETH/SULFA <=10 SENSITIVE Sensitive     CLINDAMYCIN RESISTANT Resistant     RIFAMPIN <=0.5 SENSITIVE Sensitive     Inducible Clindamycin POSITIVE Resistant     * >=100,000 COLONIES/mL METHICILLIN RESISTANT  STAPHYLOCOCCUS AUREUS  CULTURE, BLOOD (ROUTINE X 2) w Reflex to ID Panel     Status: None   Collection Time: 07/28/16  1:59 PM  Result Value Ref Range Status   Specimen Description BLOOD  LEFT HAND  Final   Special Requests BOTTLES DRAWN AEROBIC AND ANAEROBIC  BCAV  Final   Culture NO GROWTH 5 DAYS  Final   Report Status 08/02/2016 FINAL  Final  CULTURE, BLOOD (ROUTINE X 2) w Reflex to ID Panel     Status: None   Collection Time: 07/28/16  2:10 PM  Result Value Ref Range Status   Specimen Description BLOOD  LEFT WRIST  Final   Special Requests BOTTLES DRAWN AEROBIC AND ANAEROBIC  BCAV  Final   Culture NO GROWTH 5 DAYS  Final   Report Status 08/02/2016 FINAL  Final    Coagulation Studies:  Recent Labs  08/05/16 0517 08/06/16 0513 08/07/16 0455  LABPROT 14.6 17.8* 24.6*  INR 1.13 1.45 2.18    Urinalysis: No results for input(s): COLORURINE, LABSPEC, PHURINE, GLUCOSEU, HGBUR, BILIRUBINUR, KETONESUR, PROTEINUR, UROBILINOGEN, NITRITE, LEUKOCYTESUR in the last 72 hours.  Invalid input(s): APPERANCEUR    Imaging: No results found.   Medications:    . aspirin  325 mg Oral BH-q7a  . atorvastatin  10 mg Oral QHS  . calcium acetate  667 mg Oral TID WC  . epoetin (EPOGEN/PROCRIT) injection  4,000 Units Intravenous Q M,W,F-HD  . mometasone-formoterol  2 puff Inhalation BID  . pantoprazole  40 mg Oral Daily  . pregabalin  75 mg Oral BID  . sevelamer carbonate  800 mg Oral Daily  . tiotropium  18 mcg Inhalation Daily  . vancomycin  750 mg Intravenous Q M,W,F-HD  . warfarin  5 mg Oral q1800  . Warfarin - Pharmacist Dosing Inpatient   Does not apply q1800     Assessment/ Plan:  Mr. LATHAM KINZLER is a 61 y.o. black male with ESRD on hemodialysis, hypertension, hyperlipidemia, anemia, COPD, peripheral vascular disease, left BKA, right toe amputations  MWF [facility].   1.  End stage renal disease -  Complication of dialysis device. Tunnel infection.  Catheter removed 3/3,   -  Patient seen and evaluated during hemodialysis. Appears to be tolerating well. Patient will continue MWF schedule as an outpatient.  2.  Anemia of chronic kidney disease: Hemoglobin found to be 9.6 today. Continue Epogen as an outpatient.  3. Hypertension: Patient remains off of antihypertensives. Continue to monitor blood pressure as an outpatient.  4. Secondary Hyperparathyroidism: - Phosphorus currently 6.0.  Continue calcium acetate as well as Renvela.  5. Pulm Embolism - heparin, coumadin - Left femoral vein DVT noted. Patient on warfarin and heparin drip.     LOS: 4 Melchizedek Espinola 3/14/201811:36 AM

## 2016-08-07 NOTE — Progress Notes (Signed)
Patient discharged to [facility] per MD order and hospital protocol. Patient being escorted to facility by his sister; facility and social worker aware of this. Packet given to his sister to give to the facility. Patient escorted off the unit via hospital staff.

## 2016-08-07 NOTE — Progress Notes (Signed)
HD initiated via L Chest HD catheter without issue. No heparin tx.

## 2016-08-07 NOTE — Discharge Summary (Signed)
[facility] [place] NAME: [name]    MR#:  [id number]  DATE OF BIRTH:  1955/08/22  DATE OF ADMISSION:  08/03/2016 ADMITTING PHYSICIAN: Lance Coon, MD  DATE OF DISCHARGE: 08/07/16  PRIMARY CARE PHYSICIAN: [facility]    ADMISSION DIAGNOSIS:  Acute respiratory failure with hypoxia (HCC) [J96.01] Other acute pulmonary embolism with acute cor pulmonale (HCC) [I26.09] Chest pain, unspecified type [R07.9]  DISCHARGE DIAGNOSIS:  Acute PE and left femoral vein DVT now on coumadin (INR to be followed at [facility]) ESRD on HD  SECONDARY DIAGNOSIS:   Past Medical History:  Diagnosis Date  . Anemia   . Asthma   . CHF (congestive heart failure) ([place])   . Chronic systolic heart failure ([place])   . Complication of anesthesia    hypotension  . COPD (chronic obstructive pulmonary disease) ([place])   . Coronary artery disease   . Dialysis patient ([place])    Mon, Wed, Fri  . End stage renal disease ([place])   . GERD (gastroesophageal reflux disease)   . Headache   . History of kidney stones   . HLD (hyperlipidemia)   . HTN (hypertension)   . Hyperparathyroidism   . Myocardial infarction   . Peripheral vascular disease ([place])   . Shortness of breath dyspnea   . Sleep apnea   . Tobacco dependence     HOSPITAL COURSE:   [name] a 61 y.o.malewho presents with Acute onset of chest pain today. He had associated shortness of breath. Here in the ED he was found on CT imaging to have a pulmonary embolus. IV heparin was started   * Acute right Pulmonary embolism (HCC) - IV heparin. CT imaging showed no evidence of right heart strain.  -received IV heparin gtt -hgb stable at 9.9 -started po coumadin INR 1.45---2.18. PT/INR to be followed by MD at [facility]  D/c IV heparin gtt -[place] LE shows left femoral vein DVT  *HYPERTENSION, BENIGN - stable, continue home meds  *CAD, NATIVE VESSEL - patient's troponin  was elevated, likely related to his pulmonary embolus.   *ESRD (end stage renal disease) on dialysis [facility]) - nephrology consult for hemodialysis support  *Chronic systolic heart failure ([place]) - continue home meds  *MRSA HD catheter related sepsis On IV vancomycin at HD (last dose 08/27/16)  D/c to [facility] Case discussed with Care Management/Social Worker. Management plans discussed with the patient, family and they are in agreement.  CONSULTS OBTAINED:  Treatment Team:  Murlean Iba, MD Corey Skains, MD  DRUG ALLERGIES:   Allergies  Allergen Reactions  . Dust Mite Extract     DISCHARGE MEDICATIONS:   Current Discharge Medication List    START taking these medications   Details  warfarin (COUMADIN) 5 MG tablet Take 1 tablet (5 mg total) by mouth daily at 6 PM. Qty: 30 tablet, Refills: 1      CONTINUE these medications which have NOT CHANGED   Details  acetaminophen (TYLENOL) 500 MG tablet Take 1,000 mg by mouth every 6 (six) hours as needed for moderate pain.    albuterol (PROVENTIL HFA;VENTOLIN HFA) 108 (90 BASE) MCG/ACT inhaler Inhale 2 puffs into the lungs every 6 (six) hours as needed for wheezing or shortness of breath.     aspirin 325 MG tablet Take 325 mg by mouth every morning.     atorvastatin (LIPITOR) 10 MG tablet Take 10 mg by mouth at bedtime.     budesonide-formoterol (  SYMBICORT) 160-4.5 MCG/ACT inhaler Inhale 2 puffs into the lungs 2 (two) times daily. Reported on 08/14/2015    calcium acetate (PHOSLO) 667 MG capsule Take 667 mg by mouth 3 (three) times daily with meals.    Darbepoetin Alfa (ARANESP) 40 MCG/0.4ML SOSY injection Inject 0.4 mLs (40 mcg total) into the vein every Monday with hemodialysis. Qty: 8.4 mL    docusate sodium (COLACE) 100 MG capsule Take 100 mg by mouth 2 (two) times daily as needed. Reported on 08/14/2015    doxercalciferol (HECTOROL) 4 MCG/2ML injection Inject 1.25 mLs (2.5 mcg total) into the vein every Monday,  Wednesday, and Friday with hemodialysis. Qty: 2 mL    ferric gluconate 125 mg in sodium chloride 0.9 % 100 mL Inject 125 mg into the vein every Monday, Wednesday, and Friday with hemodialysis.    fluticasone (FLONASE) 50 MCG/ACT nasal spray Place 1 spray into both nostrils daily.    ipratropium-albuterol (DUONEB) 0.5-2.5 (3) MG/3ML SOLN Take 3 mLs by nebulization every 4 (four) hours as needed (SOB).    midodrine (PROAMATINE) 5 MG tablet Take 5 mg by mouth. 3 times a week, 30 minutes before dialysis    Multiple Vitamins-Minerals (MULTIVITAMIN WITH MINERALS) tablet Take 1 tablet by mouth daily.    multivitamin (RENA-VIT) TABS tablet Take 1 tablet by mouth at bedtime. Refills: 0    Nutritional Supplements (FEEDING SUPPLEMENT, NEPRO CARB STEADY,) LIQD Take 237 mLs by mouth daily.    oxyCODONE-acetaminophen (PERCOCET) 7.5-325 MG tablet Take 1 tablet by mouth every 4 (four) hours as needed for severe pain. Qty: 12 tablet, Refills: 0    pantoprazole (PROTONIX) 20 MG tablet Take 20 mg by mouth daily.    polyethylene glycol (MIRALAX / GLYCOLAX) packet Take 17 g by mouth every morning. Hold for loose stools.    pregabalin (LYRICA) 75 MG capsule Take 75 mg by mouth 2 (two) times daily.    sevelamer carbonate (RENVELA) 800 MG tablet Take 800 mg by mouth daily.     simvastatin (ZOCOR) 20 MG tablet Take 20 mg by mouth daily.    tiotropium (SPIRIVA) 18 MCG inhalation capsule Place 18 mcg into inhaler and inhale daily.    Vancomycin (VANCOCIN) 750-5 MG/150ML-% SOLN With each dialysis, Tuesday, Thursday and Saturday. Last dose April 3rd Qty: 4000 mL      STOP taking these medications     OxyCODONE HCl, Abuse Deter, (OXAYDO) 5 MG TABA         If you experience worsening of your admission symptoms, develop shortness of breath, life threatening emergency, suicidal or homicidal thoughts you must seek medical attention immediately by calling 911 or calling your MD immediately  if symptoms less  severe.  You Must read complete instructions/literature along with all the possible adverse reactions/side effects for all the Medicines you take and that have been prescribed to you. Take any new Medicines after you have completely understood and accept all the possible adverse reactions/side effects.   Please note  You were cared for by a hospitalist during your hospital stay. If you have any questions about your discharge medications or the care you received while you were in the hospital after you are discharged, you can call the unit and asked to speak with the hospitalist on call if the hospitalist that took care of you is not available. Once you are discharged, your primary care physician will handle any further medical issues. Please note that NO REFILLS for any discharge medications will be authorized once you are  discharged, as it is imperative that you return to your primary care physician (or establish a relationship with a primary care physician if you do not have one) for your aftercare needs so that they can reassess your need for medications and monitor your lab values. Today   SUBJECTIVE     VITAL SIGNS:  Blood pressure 107/81, pulse 93, temperature 97.6 F (36.4 C), temperature source Oral, resp. rate 18, height 6\' 2"  (1.88 m), weight 86.5 kg (190 lb 11.2 oz), SpO2 99 %.  I/O:   Intake/Output Summary (Last 24 hours) at 08/07/16 1215 Last data filed at 08/07/16 0428  Gross per 24 hour  Intake              442 ml  Output                0 ml  Net              442 ml    PHYSICAL EXAMINATION:  GENERAL:  61 y.o.-year-old patient lying in the bed with no acute distress.  EYES: Pupils equal, round, reactive to light and accommodation. No scleral icterus. Extraocular muscles intact.  HEENT: Head atraumatic, normocephalic. Oropharynx and nasopharynx clear.  NECK:  Supple, no jugular venous distention. No thyroid enlargement, no tenderness.  LUNGS: Normal breath sounds  bilaterally, no wheezing, rales,rhonchi or crepitation. No use of accessory muscles of respiration.  CARDIOVASCULAR: S1, S2 normal. No murmurs, rubs, or gallops.  ABDOMEN: Soft, non-tender, non-distended. Bowel sounds present. No organomegaly or mass.  EXTREMITIES: No pedal edema, cyanosis, or clubbing.  NEUROLOGIC: Cranial nerves II through XII are intact. Muscle strength 5/5 in all extremities. Sensation intact. Gait not checked.  PSYCHIATRIC: The patient is alert and oriented x 3.  SKIN: No obvious rash, lesion, or ulcer.   DATA REVIEW:   CBC   Recent Labs Lab 08/07/16 0455  WBC 9.3  HGB 9.6*  HCT 28.5*  PLT 242    Chemistries   Recent Labs Lab 08/03/16 1657 08/04/16 0456  NA  --  136  K  --  4.5  CL  --  100*  CO2  --  25  GLUCOSE  --  107*  BUN  --  65*  CREATININE  --  10.83*  CALCIUM  --  8.7*  AST 51*  --   ALT 30  --   ALKPHOS 78  --   BILITOT 0.7  --     Microbiology Results   Recent Results (from the past 240 hour(s))  CULTURE, BLOOD (ROUTINE X 2) w Reflex to ID Panel     Status: None   Collection Time: 07/28/16  1:59 PM  Result Value Ref Range Status   Specimen Description BLOOD  LEFT HAND  Final   Special Requests BOTTLES DRAWN AEROBIC AND ANAEROBIC  BCAV  Final   Culture NO GROWTH 5 DAYS  Final   Report Status 08/02/2016 FINAL  Final  CULTURE, BLOOD (ROUTINE X 2) w Reflex to ID Panel     Status: None   Collection Time: 07/28/16  2:10 PM  Result Value Ref Range Status   Specimen Description BLOOD  LEFT WRIST  Final   Special Requests BOTTLES DRAWN AEROBIC AND ANAEROBIC  BCAV  Final   Culture NO GROWTH 5 DAYS  Final   Report Status 08/02/2016 FINAL  Final    RADIOLOGY:  No results found.   Management plans discussed with the patient, family and they are in agreement.  CODE STATUS:  Code Status Orders        Start     Ordered   08/03/16 2320  Do not attempt resuscitation (DNR)  Continuous    Question Answer Comment  In the  event of cardiac or respiratory ARREST Do not call a "code blue"   In the event of cardiac or respiratory ARREST Do not perform Intubation, CPR, defibrillation or ACLS   In the event of cardiac or respiratory ARREST Use medication by any route, position, wound care, and other measures to relive pain and suffering. May use oxygen, suction and manual treatment of airway obstruction as needed for comfort.      08/03/16 2319    Code Status History    Date Active Date Inactive Code Status Order ID Comments User Context   07/26/2016  5:07 PM 07/31/2016  9:58 PM DNR [id number]  Vaughan Basta, MD Inpatient   05/22/2016 12:34 AM 05/24/2016  7:11 PM Full Code [id number]  Harvie Bridge, DO Inpatient   01/13/2015  1:44 AM 01/14/2015  2:46 PM Full Code [id number]  Juluis Mire, MD ED   12/15/2014  9:32 AM 12/15/2014  2:31 PM Full Code [id number]  Algernon Huxley, MD Inpatient   05/06/2014  8:29 PM 05/10/2014  5:23 PM Full Code [id number]  Elam Dutch, MD Inpatient   05/04/2014 10:24 PM 05/06/2014  8:29 PM Full Code [id number]  Rise Patience, MD Inpatient      TOTAL TIME TAKING CARE OF THIS PATIENT: 40 minutes.    Manuel Dall M.D on 08/07/2016 at 12:15 PM  Between 7am to 6pm - Pager - [phone] After 6pm go to [email or web address] - password [email or web address] [facility]  Office  [phone]  CC: Primary care physician; [place]

## 2016-08-07 NOTE — Progress Notes (Signed)
57- Primary nurse called to notify Heparin gtt had been discontinued per MD. Drip off at 1025 am as ordered.

## 2016-08-07 NOTE — Progress Notes (Signed)
ANTICOAGULATION CONSULT NOTE - Follow Up Consult  Pharmacy Consult for warfarin dosing Indication: pulmonary embolus      Allergies  Allergen Reactions  . Dust Mite Extract     Patient Measurements: Height: 6\' 2"  (188 cm) Weight: 188 lb 1.6 oz (85.3 kg) IBW/kg (Calculated) : 82.2 Heparin Dosing Weight:   Vital Signs: Temp: 98.4 F (36.9 C) (03/13 0417) Temp Source: Oral (03/13 0417) BP: 100/66 (03/13 0417) Pulse Rate: 76 (03/13 0417)  Labs:  Recent Labs (last 2 labs)    Recent Labs  08/03/16 1602 08/03/16 1657  08/04/16 0009  08/04/16 0456 08/04/16 1115 08/04/16 1257 08/05/16 0517 08/06/16 0513  HGB 11.3*  --   --   --   --  9.5*  --   --  9.6* 9.7*  HCT 33.4*  --   --   --   --  28.6*  --   --  28.8* 28.6*  PLT 232  --   --   --   --  218  --   --  236 234  APTT  --  33  --   --   --   --   --   --   --   --   LABPROT  --  13.3  --   --   --   --   --   --  14.6 17.8*  INR  --  1.01  --   --   --   --   --   --  1.13 1.45  HEPARINUNFRC  --   --   --   --   < > 0.54  --  0.49 0.37 0.30  CREATININE 9.54*  --   --   --   --  10.83*  --   --   --   --   TROPONINI 0.21*  --   < > 0.27*  --  0.38* 0.75*  --   --   --   < > = values in this interval not displayed.    Estimated Creatinine Clearance: 8.2 mL/min (by C-G formula based on SCr of 10.83 mg/dL (H)).   Medications:  Scheduled:  . aspirin  325 mg Oral BH-q7a  . atorvastatin  10 mg Oral QHS  . calcium acetate  667 mg Oral TID WC  . epoetin (EPOGEN/PROCRIT) injection  4,000 Units Intravenous Q M,W,F-HD  . mometasone-formoterol  2 puff Inhalation BID  . pantoprazole  40 mg Oral Daily  . pregabalin  75 mg Oral BID  . sevelamer carbonate  800 mg Oral Daily  . tiotropium  18 mcg Inhalation Daily  . [START ON 08/07/2016] vancomycin  750 mg Intravenous Q M,W,F-HD  . warfarin  5 mg Oral ONCE-1800  . Warfarin - Pharmacist Dosing Inpatient   Does not apply q1800    Assessment: Patient  admitted for PE as shown on CT PE w/ non-occlusive DVT Patient currently on heparin drip w/ last HL 3/13 @ 0500 0.30 and being titrated up to 1400 units/hour.  3/10 INR 1.01  3/11 INR  ??? - warfarin 5 mg 3/12 INR 1.13 - warfarin 5 mg 3/13 INR 1.45 - warfarin 6 mg 3/14 INR 2.18   Goal of Therapy:  INR 2-3 Monitor platelets by anticoagulation protocol: Yes   Plan:  Will give another warfarin 5 mg dose tonight 3/13 @ 1800 and recheck INR w/ am labs. Continue to monitor H&H and platelets  08/06/16 1159 provider spoke to pharmacist  and wants to increase VKA dose due to patient frustration and desire to go home. INR 1.45 after 2 doses of VKA 5 mg. Baseline albumin low, ESRD HD patient. Provider has entered one time dose of 6 mg, which I think is reasonable. Will recheck INR in Am.  3/14 INR has jumped to 2.18 from 1.45 after 2 doses of warfarin 5 mg -- warfarin 6 mg has yet to take effect, therefore will hold a dose tonight and recheck INR 3/15 w/ am labs as the INR is expected to continue increasing.  Thank you for this consult.  Tobie Lords, PharmD, BCPS Clinical Pharmacist 08/07/2016

## 2016-08-07 NOTE — Progress Notes (Signed)
Pre HD assessment 1015 (not 0915 as previously charted in error)

## 2016-08-07 NOTE — Progress Notes (Signed)
HD completed without issue. Goal met. Patient tolerated well

## 2016-08-07 NOTE — Clinical Social Work Note (Signed)
Patient to be d/c'ed today to [facility].  Patient and family agreeable to plans will transport via sister's car RN to call report to [phone].  Evette Cristal, MSW, [place]

## 2016-08-07 NOTE — Progress Notes (Signed)
ANTIBIOTIC CONSULT NOTE - INITIAL  Pharmacy Consult for Vancomycin  Indication: bacteremia; MRSA on tip cath       Allergies  Allergen Reactions  . Dust Mite Extract     Patient Measurements: Height: 6\' 2"  (188 cm) Weight: 186 lb 8 oz (84.6 kg) IBW/kg (Calculated) : 82.2 Adjusted Body Weight:   Vital Signs: Temp: 98.2 F (36.8 C) (03/12 1100) Temp Source: Oral (03/12 1100) BP: 119/73 (03/12 1100) Pulse Rate: 72 (03/12 1100) Intake/Output from previous day: 03/11 0701 - 03/12 0700 In: -  Out: 238  Intake/Output from this shift: Total I/O In: 120 [P.O.:120] Out: 0   Labs:  Recent Labs (last 2 labs)    Recent Labs  08/03/16 1602 08/04/16 0456 08/05/16 0517  WBC 9.8 8.0 8.2  HGB 11.3* 9.5* 9.6*  PLT 232 218 236  CREATININE 9.54* 10.83*  --      Estimated Creatinine Clearance: 8.2 mL/min (by C-G formula based on SCr of 10.83 mg/dL (H)). Recent Labs (last 2 labs)   No results for input(s): VANCOTROUGH, VANCOPEAK, VANCORANDOM, GENTTROUGH, GENTPEAK, GENTRANDOM, TOBRATROUGH, TOBRAPEAK, TOBRARND, AMIKACINPEAK, AMIKACINTROU, AMIKACIN in the last 72 hours.     Microbiology:        Recent Results (from the past 720 hour(s))  Surgical pcr screen     Status: None   Collection Time: 07/25/16  1:03 PM  Result Value Ref Range Status   MRSA, PCR NEGATIVE NEGATIVE Final   Staphylococcus aureus NEGATIVE NEGATIVE Final    Comment:        The Xpert SA Assay (FDA approved for NASAL specimens in patients over 41 years of age), is one component of a comprehensive surveillance program.  Test performance has been validated by [facility] for patients greater than or equal to 31 year old. It is not intended to diagnose infection nor to guide or monitor treatment.   Blood Culture (routine x 2)     Status: Abnormal   Collection Time: 07/26/16  8:08 AM  Result Value Ref Range Status   Specimen Description BLOOD LEFT ANTECUBITAL  Final   Special  Requests BOTTLES DRAWN AEROBIC AND ANAEROBIC BCAV  Final   Culture  Setup Time   Final    GRAM POSITIVE COCCI ANAEROBIC BOTTLE ONLY CRITICAL VALUE NOTED.  VALUE IS CONSISTENT WITH PREVIOUSLY REPORTED AND CALLED VALUE.    Culture (A)  Final    STAPHYLOCOCCUS AUREUS SUSCEPTIBILITIES PERFORMED ON PREVIOUS CULTURE WITHIN THE LAST 5 DAYS. Performed at [facility], [place] [address] [place], [place] [address]    Report Status 07/29/2016 FINAL  Final  Blood Culture (routine x 2)     Status: Abnormal   Collection Time: 07/26/16  9:57 AM  Result Value Ref Range Status   Specimen Description BLOOD LEFT ARM  Final   Special Requests BOTTLES DRAWN AEROBIC AND ANAEROBIC BCHV  Final   Culture  Setup Time   Final    GRAM POSITIVE COCCI AEROBIC BOTTLE ONLY CRITICAL RESULT CALLED TO, READ BACK BY AND VERIFIED WITH: [name] COOKSON AT 2325 07/26/16.PMH Performed at [facility], [place] [address] [place], [place] [address]    Culture METHICILLIN RESISTANT STAPHYLOCOCCUS AUREUS (A)  Final   Report Status 07/29/2016 FINAL  Final   Organism ID, Bacteria METHICILLIN RESISTANT STAPHYLOCOCCUS AUREUS  Final      Susceptibility   Methicillin resistant staphylococcus aureus - MIC*    CIPROFLOXACIN >=8 RESISTANT Resistant     ERYTHROMYCIN >=8 RESISTANT Resistant  GENTAMICIN <=0.5 SENSITIVE Sensitive     OXACILLIN >=4 RESISTANT Resistant     TETRACYCLINE <=1 SENSITIVE Sensitive     VANCOMYCIN 1 SENSITIVE Sensitive     TRIMETH/SULFA <=10 SENSITIVE Sensitive     CLINDAMYCIN RESISTANT Resistant     RIFAMPIN <=0.5 SENSITIVE Sensitive     Inducible Clindamycin POSITIVE Resistant     * METHICILLIN RESISTANT STAPHYLOCOCCUS AUREUS  Blood Culture ID Panel (Reflexed)     Status: Abnormal   Collection Time: 07/26/16  9:57 AM  Result Value Ref Range Status   Enterococcus species NOT DETECTED NOT DETECTED Final   Listeria monocytogenes NOT  DETECTED NOT DETECTED Final   Staphylococcus species DETECTED (A) NOT DETECTED Final    Comment: CRITICAL RESULT CALLED TO, READ BACK BY AND VERIFIED WITH: [name] COOKSON AT 2325 07/26/16.PMH    Staphylococcus aureus DETECTED (A) NOT DETECTED Final    Comment: Methicillin (oxacillin)-resistant Staphylococcus aureus (MRSA). MRSA is predictably resistant to beta-lactam antibiotics (except ceftaroline). Preferred therapy is vancomycin unless clinically contraindicated. Patient requires contact precautions if  hospitalized. CRITICAL RESULT CALLED TO, READ BACK BY AND VERIFIED WITH: [name] COOKSON AT 2325 07/26/16.PMH    Methicillin resistance DETECTED (A) NOT DETECTED Final    Comment: CRITICAL RESULT CALLED TO, READ BACK BY AND VERIFIED WITH: [name] COOKSON AT 2325 07/26/16.PMH    Streptococcus species NOT DETECTED NOT DETECTED Final   Streptococcus agalactiae NOT DETECTED NOT DETECTED Final   Streptococcus pneumoniae NOT DETECTED NOT DETECTED Final   Streptococcus pyogenes NOT DETECTED NOT DETECTED Final   Acinetobacter baumannii NOT DETECTED NOT DETECTED Final   Enterobacteriaceae species NOT DETECTED NOT DETECTED Final   Enterobacter cloacae complex NOT DETECTED NOT DETECTED Final   Escherichia coli NOT DETECTED NOT DETECTED Final   Klebsiella oxytoca NOT DETECTED NOT DETECTED Final   Klebsiella pneumoniae NOT DETECTED NOT DETECTED Final   Proteus species NOT DETECTED NOT DETECTED Final   Serratia marcescens NOT DETECTED NOT DETECTED Final   Haemophilus influenzae NOT DETECTED NOT DETECTED Final   Neisseria meningitidis NOT DETECTED NOT DETECTED Final   Pseudomonas aeruginosa NOT DETECTED NOT DETECTED Final   Candida albicans NOT DETECTED NOT DETECTED Final   Candida glabrata NOT DETECTED NOT DETECTED Final   Candida krusei NOT DETECTED NOT DETECTED Final   Candida parapsilosis NOT DETECTED NOT DETECTED Final   Candida tropicalis NOT DETECTED NOT DETECTED Final   MRSA PCR Screening     Status: None   Collection Time: 07/27/16  1:10 AM  Result Value Ref Range Status   MRSA by PCR NEGATIVE NEGATIVE Final    Comment:        The GeneXpert MRSA Assay (FDA approved for NASAL specimens only), is one component of a comprehensive MRSA colonization surveillance program. It is not intended to diagnose MRSA infection nor to guide or monitor treatment for MRSA infections.   Cath Tip Culture     Status: Abnormal   Collection Time: 07/27/16 12:07 PM  Result Value Ref Range Status   Specimen Description CATH TIP  Final   Special Requests NONE  Final   Culture (A)  Final    >=100,000 COLONIES/mL METHICILLIN RESISTANT STAPHYLOCOCCUS AUREUS   Report Status 07/30/2016 FINAL  Final   Organism ID, Bacteria METHICILLIN RESISTANT STAPHYLOCOCCUS AUREUS (A)  Final      Susceptibility   Methicillin resistant staphylococcus aureus - MIC*    CIPROFLOXACIN >=8 RESISTANT Resistant     ERYTHROMYCIN >=8 RESISTANT Resistant     GENTAMICIN <=0.5 SENSITIVE  Sensitive     OXACILLIN >=4 RESISTANT Resistant     TETRACYCLINE <=1 SENSITIVE Sensitive     VANCOMYCIN 1 SENSITIVE Sensitive     TRIMETH/SULFA <=10 SENSITIVE Sensitive     CLINDAMYCIN RESISTANT Resistant     RIFAMPIN <=0.5 SENSITIVE Sensitive     Inducible Clindamycin POSITIVE Resistant     * >=100,000 COLONIES/mL METHICILLIN RESISTANT STAPHYLOCOCCUS AUREUS  CULTURE, BLOOD (ROUTINE X 2) w Reflex to ID Panel     Status: None   Collection Time: 07/28/16  1:59 PM  Result Value Ref Range Status   Specimen Description BLOOD  LEFT HAND  Final   Special Requests BOTTLES DRAWN AEROBIC AND ANAEROBIC  BCAV  Final   Culture NO GROWTH 5 DAYS  Final   Report Status 08/02/2016 FINAL  Final  CULTURE, BLOOD (ROUTINE X 2) w Reflex to ID Panel     Status: None   Collection Time: 07/28/16  2:10 PM  Result Value Ref Range Status   Specimen Description BLOOD  LEFT WRIST   Final   Special Requests BOTTLES DRAWN AEROBIC AND ANAEROBIC  BCAV  Final   Culture NO GROWTH 5 DAYS  Final   Report Status 08/02/2016 FINAL  Final    Medical History:     Past Medical History:  Diagnosis Date  . Anemia   . Asthma   . CHF (congestive heart failure) ([place])   . Chronic systolic heart failure ([place])   . Complication of anesthesia    hypotension  . COPD (chronic obstructive pulmonary disease) ([place])   . Coronary artery disease   . Dialysis patient ([place])    Mon, Wed, Fri  . End stage renal disease ([place])   . GERD (gastroesophageal reflux disease)   . Headache   . History of kidney stones   . HLD (hyperlipidemia)   . HTN (hypertension)   . Hyperparathyroidism   . Myocardial infarction   . Peripheral vascular disease ([place])   . Shortness of breath dyspnea   . Sleep apnea   . Tobacco dependence     Medications:         Prescriptions Prior to Admission  Medication Sig Dispense Refill Last Dose  . acetaminophen (TYLENOL) 500 MG tablet Take 1,000 mg by mouth every 6 (six) hours as needed for moderate pain.   prn at prn  . albuterol (PROVENTIL HFA;VENTOLIN HFA) 108 (90 BASE) MCG/ACT inhaler Inhale 2 puffs into the lungs every 6 (six) hours as needed for wheezing or shortness of breath.    prn at prn  . aspirin 325 MG tablet Take 325 mg by mouth every morning.    Past Week at 0800  . atorvastatin (LIPITOR) 10 MG tablet Take 10 mg by mouth at bedtime.    07/25/2016 at 2000  . budesonide-formoterol (SYMBICORT) 160-4.5 MCG/ACT inhaler Inhale 2 puffs into the lungs 2 (two) times daily. Reported on 08/14/2015   07/25/2016 at 2000  . calcium acetate (PHOSLO) 667 MG capsule Take 667 mg by mouth 3 (three) times daily with meals.   07/25/2016 at 2000  . Darbepoetin Alfa (ARANESP) 40 MCG/0.4ML SOSY injection Inject 0.4 mLs (40 mcg total) into the vein every Monday with hemodialysis. 8.4 mL  07/26/2016 at Unknown time  . docusate sodium (COLACE)  100 MG capsule Take 100 mg by mouth 2 (two) times daily as needed. Reported on 08/14/2015   prn at prn  . doxercalciferol (HECTOROL) 4 MCG/2ML injection Inject 1.25 mLs (2.5 mcg total) into the vein every  Monday, Wednesday, and Friday with hemodialysis. 2 mL  07/26/2016 at Unknown time  . ferric gluconate 125 mg in sodium chloride 0.9 % 100 mL Inject 125 mg into the vein every Monday, Wednesday, and Friday with hemodialysis.   07/26/2016 at Unknown time  . fluticasone (FLONASE) 50 MCG/ACT nasal spray Place 1 spray into both nostrils daily.   Past Week at 0800  . ipratropium-albuterol (DUONEB) 0.5-2.5 (3) MG/3ML SOLN Take 3 mLs by nebulization every 4 (four) hours as needed (SOB).   prn at prn  . midodrine (PROAMATINE) 5 MG tablet Take 5 mg by mouth. 3 times a week, 30 minutes before dialysis   Past Week at Unknown time  . Multiple Vitamins-Minerals (MULTIVITAMIN WITH MINERALS) tablet Take 1 tablet by mouth daily.   07/25/2016 at 2000  . multivitamin (RENA-VIT) TABS tablet Take 1 tablet by mouth at bedtime.  0 Past Week at Unknown time  . Nutritional Supplements (FEEDING SUPPLEMENT, NEPRO CARB STEADY,) LIQD Take 237 mLs by mouth daily.   07/24/2016 at Unknown time  . OxyCODONE HCl, Abuse Deter, (OXAYDO) 5 MG TABA Take 1 tablet by mouth every 8 (eight) hours as needed. 30 tablet 0 07/25/2016 at prn  . oxyCODONE-acetaminophen (PERCOCET) 7.5-325 MG tablet Take 1 tablet by mouth every 4 (four) hours as needed for severe pain. 12 tablet 0   . pantoprazole (PROTONIX) 20 MG tablet Take 20 mg by mouth daily.   07/26/2016 at 0600  . polyethylene glycol (MIRALAX / GLYCOLAX) packet Take 17 g by mouth every morning. Hold for loose stools.   Past Week at 0800  . pregabalin (LYRICA) 75 MG capsule Take 75 mg by mouth 2 (two) times daily.   07/25/2016 at 1600  . sevelamer carbonate (RENVELA) 800 MG tablet Take 800 mg by mouth daily.    Past Week at Unknown time  . simvastatin (ZOCOR) 20 MG tablet Take 20 mg  by mouth daily.   07/25/2016 at 2000  . tiotropium (SPIRIVA) 18 MCG inhalation capsule Place 18 mcg into inhaler and inhale daily.   07/25/2016 at 0800  . Vancomycin (VANCOCIN) 750-5 MG/150ML-% SOLN With each dialysis, Tuesday, Thursday and Saturday. Last dose April 3rd 4000 mL     Scheduled:  . aspirin  325 mg Oral BH-q7a  . atorvastatin  10 mg Oral QHS  . calcium acetate  667 mg Oral TID WC  . epoetin (EPOGEN/PROCRIT) injection  4,000 Units Intravenous Q M,W,F-HD  . mometasone-formoterol  2 puff Inhalation BID  . pantoprazole  40 mg Oral Daily  . pregabalin  75 mg Oral BID  . sevelamer carbonate  800 mg Oral Daily  . tiotropium  18 mcg Inhalation Daily  . vancomycin  2,000 mg Intravenous Once  . [START ON 08/07/2016] vancomycin  750 mg Intravenous Q M,W,F-HD  . warfarin  5 mg Oral ONCE-1800  . Warfarin - Pharmacist Dosing Inpatient   Does not apply q1800   Assessment: Pharmacy consulted to dose and monitor Vancomycin in this 61 year old HD patient. Patient cath tip growing MRSA. Nephrologist would like to start Vancomycin and continue until 4/2.   Goal of Therapy:  Vancomycin trough level 15-20 mcg/ml  Plan:  Will give Vancomycin 2 g IV x 1 load and will then start Vancomycin 750 mg qHD. Will check trough level prior to the 3rd dialysis session.   3/14 VR @ 0500: 25 mg/L -- dialysis will take 30 - 40% off so estimated post HD level 15 - 17.5. Will continue  vanc 750 mg w/ MWF HD sessions and will recheck next VR 3/16 w/ am labs.  Thank you for this consult.  Tobie Lords, PharmD, BCPS Clinical Pharmacist 08/07/2016

## 2016-08-21 ENCOUNTER — Emergency Department: Payer: Medicare Other

## 2016-08-21 ENCOUNTER — Inpatient Hospital Stay
Admission: EM | Admit: 2016-08-21 | Discharge: 2016-08-22 | DRG: 311 | Disposition: A | Discharge status: Skilled Nursing Facility | Payer: Medicare Other | Attending: Internal Medicine | Admitting: Internal Medicine

## 2016-08-21 ENCOUNTER — Encounter: Payer: Self-pay | Admitting: *Deleted

## 2016-08-21 DIAGNOSIS — D631 Anemia in chronic kidney disease: Secondary | ICD-10-CM | POA: Diagnosis present

## 2016-08-21 DIAGNOSIS — I252 Old myocardial infarction: Secondary | ICD-10-CM | POA: Diagnosis not present

## 2016-08-21 DIAGNOSIS — Z66 Do not resuscitate: Secondary | ICD-10-CM | POA: Diagnosis present

## 2016-08-21 DIAGNOSIS — E785 Hyperlipidemia, unspecified: Secondary | ICD-10-CM | POA: Diagnosis present

## 2016-08-21 DIAGNOSIS — N186 End stage renal disease: Secondary | ICD-10-CM | POA: Diagnosis present

## 2016-08-21 DIAGNOSIS — I2510 Atherosclerotic heart disease of native coronary artery without angina pectoris: Secondary | ICD-10-CM | POA: Diagnosis present

## 2016-08-21 DIAGNOSIS — Z89512 Acquired absence of left leg below knee: Secondary | ICD-10-CM

## 2016-08-21 DIAGNOSIS — Z833 Family history of diabetes mellitus: Secondary | ICD-10-CM

## 2016-08-21 DIAGNOSIS — I739 Peripheral vascular disease, unspecified: Secondary | ICD-10-CM | POA: Diagnosis present

## 2016-08-21 DIAGNOSIS — I132 Hypertensive heart and chronic kidney disease with heart failure and with stage 5 chronic kidney disease, or end stage renal disease: Secondary | ICD-10-CM | POA: Diagnosis present

## 2016-08-21 DIAGNOSIS — Z79899 Other long term (current) drug therapy: Secondary | ICD-10-CM

## 2016-08-21 DIAGNOSIS — Z7982 Long term (current) use of aspirin: Secondary | ICD-10-CM

## 2016-08-21 DIAGNOSIS — Z91048 Other nonmedicinal substance allergy status: Secondary | ICD-10-CM

## 2016-08-21 DIAGNOSIS — I248 Other forms of acute ischemic heart disease: Principal | ICD-10-CM | POA: Diagnosis present

## 2016-08-21 DIAGNOSIS — I214 Non-ST elevation (NSTEMI) myocardial infarction: Secondary | ICD-10-CM | POA: Diagnosis present

## 2016-08-21 DIAGNOSIS — Z955 Presence of coronary angioplasty implant and graft: Secondary | ICD-10-CM | POA: Diagnosis not present

## 2016-08-21 DIAGNOSIS — K219 Gastro-esophageal reflux disease without esophagitis: Secondary | ICD-10-CM | POA: Diagnosis present

## 2016-08-21 DIAGNOSIS — J449 Chronic obstructive pulmonary disease, unspecified: Secondary | ICD-10-CM | POA: Diagnosis present

## 2016-08-21 DIAGNOSIS — Z8249 Family history of ischemic heart disease and other diseases of the circulatory system: Secondary | ICD-10-CM

## 2016-08-21 DIAGNOSIS — Z8614 Personal history of Methicillin resistant Staphylococcus aureus infection: Secondary | ICD-10-CM

## 2016-08-21 DIAGNOSIS — Z7951 Long term (current) use of inhaled steroids: Secondary | ICD-10-CM

## 2016-08-21 DIAGNOSIS — N2581 Secondary hyperparathyroidism of renal origin: Secondary | ICD-10-CM | POA: Diagnosis present

## 2016-08-21 DIAGNOSIS — Z992 Dependence on renal dialysis: Secondary | ICD-10-CM

## 2016-08-21 DIAGNOSIS — I5022 Chronic systolic (congestive) heart failure: Secondary | ICD-10-CM | POA: Diagnosis present

## 2016-08-21 DIAGNOSIS — F17210 Nicotine dependence, cigarettes, uncomplicated: Secondary | ICD-10-CM | POA: Diagnosis present

## 2016-08-21 DIAGNOSIS — Z87442 Personal history of urinary calculi: Secondary | ICD-10-CM | POA: Diagnosis not present

## 2016-08-21 DIAGNOSIS — Z86711 Personal history of pulmonary embolism: Secondary | ICD-10-CM

## 2016-08-21 DIAGNOSIS — Z7901 Long term (current) use of anticoagulants: Secondary | ICD-10-CM

## 2016-08-21 DIAGNOSIS — G4733 Obstructive sleep apnea (adult) (pediatric): Secondary | ICD-10-CM | POA: Diagnosis present

## 2016-08-21 DIAGNOSIS — Z89421 Acquired absence of other right toe(s): Secondary | ICD-10-CM | POA: Diagnosis not present

## 2016-08-21 LAB — CBC
HCT: 30.8 % — ABNORMAL LOW (ref 40.0–52.0)
HEMOGLOBIN: 10.3 g/dL — AB (ref 13.0–18.0)
MCH: 31.3 pg (ref 26.0–34.0)
MCHC: 33.5 g/dL (ref 32.0–36.0)
MCV: 93.5 fL (ref 80.0–100.0)
Platelets: 174 10*3/uL (ref 150–440)
RBC: 3.3 MIL/uL — AB (ref 4.40–5.90)
RDW: 17.4 % — ABNORMAL HIGH (ref 11.5–14.5)
WBC: 7 10*3/uL (ref 3.8–10.6)

## 2016-08-21 LAB — TROPONIN I
TROPONIN I: 1.71 ng/mL — AB (ref <0.03)
Troponin I: 1.69 ng/mL (ref <0.03)
Troponin I: 1.8 ng/mL (ref <0.03)
Troponin I: 1.66 ng/mL (ref <0.03)

## 2016-08-21 LAB — PROTIME-INR
INR: 2.66
Prothrombin Time: 28.9 seconds — ABNORMAL HIGH (ref 11.4–15.2)

## 2016-08-21 LAB — TSH: TSH: 0.86 u[IU]/mL (ref 0.350–4.500)

## 2016-08-21 LAB — HEPARIN LEVEL (UNFRACTIONATED)
HEPARIN UNFRACTIONATED: 0.34 [IU]/mL (ref 0.30–0.70)
Heparin Unfractionated: <0.1 IU/mL — ABNORMAL LOW (ref 0.30–0.70)
Heparin Unfractionated: 0.44 IU/mL (ref 0.30–0.70)

## 2016-08-21 LAB — BASIC METABOLIC PANEL
Anion gap: 11 (ref 5–15)
BUN: 67 mg/dL — ABNORMAL HIGH (ref 6–20)
CALCIUM: 9.3 mg/dL (ref 8.9–10.3)
CHLORIDE: 103 mmol/L (ref 101–111)
CO2: 21 mmol/L — AB (ref 22–32)
CREATININE: 11.19 mg/dL — AB (ref 0.61–1.24)
GFR calc non Af Amer: 4 mL/min — ABNORMAL LOW (ref >60)
GFR, EST AFRICAN AMERICAN: 5 mL/min — AB (ref >60)
GLUCOSE: 101 mg/dL — AB (ref 65–99)
Potassium: 6.2 mmol/L — ABNORMAL HIGH (ref 3.5–5.1)
Sodium: 135 mmol/L (ref 135–145)

## 2016-08-21 LAB — VANCOMYCIN, RANDOM: Vancomycin Rm: 12

## 2016-08-21 LAB — APTT: aPTT: 87 seconds — ABNORMAL HIGH (ref 24–36)

## 2016-08-21 MED ORDER — CALCIUM GLUCONATE 10 % IV SOLN
INTRAVENOUS | Status: AC
  Filled 2016-08-21: qty 10

## 2016-08-21 MED ORDER — HEPARIN (PORCINE) IN NACL 100-0.45 UNIT/ML-% IJ SOLN
1100.0000 [IU]/h | INTRAMUSCULAR | Status: DC
  Administered 2016-08-21 – 2016-08-22 (×2): 1000 [IU]/h via INTRAVENOUS
  Filled 2016-08-21 (×2): qty 250

## 2016-08-21 MED ORDER — POLYETHYLENE GLYCOL 3350 17 G PO PACK: 17.0000 g | PACK | ORAL | Status: DC

## 2016-08-21 MED ORDER — SEVELAMER CARBONATE 800 MG PO TABS
800.0000 mg | ORAL_TABLET | Freq: Every day | ORAL | Status: DC
  Administered 2016-08-22: 800 mg via ORAL
  Filled 2016-08-21: qty 1

## 2016-08-21 MED ORDER — VANCOMYCIN HCL IN DEXTROSE 1-5 GM/200ML-% IV SOLN
1000.0000 mg | INTRAVENOUS | Status: DC
  Filled 2016-08-21: qty 200

## 2016-08-21 MED ORDER — PANTOPRAZOLE SODIUM 20 MG PO TBEC
20.0000 mg | DELAYED_RELEASE_TABLET | Freq: Every day | ORAL | Status: DC
  Filled 2016-08-21: qty 1

## 2016-08-21 MED ORDER — MOMETASONE FURO-FORMOTEROL FUM 200-5 MCG/ACT IN AERO
2.0000 | INHALATION_SPRAY | Freq: Two times a day (BID) | RESPIRATORY_TRACT | Status: DC
  Administered 2016-08-21 – 2016-08-22 (×3): 2 via RESPIRATORY_TRACT
  Filled 2016-08-21: qty 8.8

## 2016-08-21 MED ORDER — IPRATROPIUM-ALBUTEROL 0.5-2.5 (3) MG/3ML IN SOLN: 3.0000 mL | RESPIRATORY_TRACT | Status: DC | PRN

## 2016-08-21 MED ORDER — RENA-VITE PO TABS
1.0000 | ORAL_TABLET | Freq: Every day | ORAL | Status: DC
  Administered 2016-08-21: 1 via ORAL
  Filled 2016-08-21: qty 1

## 2016-08-21 MED ORDER — TIOTROPIUM BROMIDE MONOHYDRATE 18 MCG IN CAPS
18.0000 ug | ORAL_CAPSULE | Freq: Every day | RESPIRATORY_TRACT | Status: DC
  Administered 2016-08-21 – 2016-08-22 (×2): 18 ug via RESPIRATORY_TRACT
  Filled 2016-08-21: qty 5

## 2016-08-21 MED ORDER — HEPARIN BOLUS VIA INFUSION
4000.0000 [IU] | Freq: Once | INTRAVENOUS | Status: AC
  Administered 2016-08-21: 4000 [IU] via INTRAVENOUS
  Filled 2016-08-21: qty 4000

## 2016-08-21 MED ORDER — VANCOMYCIN HCL IN DEXTROSE 1-5 GM/200ML-% IV SOLN
1000.0000 mg | Freq: Once | INTRAVENOUS | Status: AC
  Administered 2016-08-21: 1000 mg via INTRAVENOUS
  Filled 2016-08-21: qty 200

## 2016-08-21 MED ORDER — CALCIUM ACETATE 667 MG PO CAPS
667.0000 mg | ORAL_CAPSULE | Freq: Three times a day (TID) | ORAL | Status: DC
  Administered 2016-08-21 – 2016-08-22 (×3): 667 mg via ORAL
  Filled 2016-08-21 (×9): qty 1

## 2016-08-21 MED ORDER — PANTOPRAZOLE SODIUM 40 MG PO TBEC
40.0000 mg | DELAYED_RELEASE_TABLET | Freq: Every day | ORAL | Status: DC
  Administered 2016-08-21 – 2016-08-22 (×2): 40 mg via ORAL
  Filled 2016-08-21 (×2): qty 1

## 2016-08-21 MED ORDER — MORPHINE SULFATE (PF) 4 MG/ML IV SOLN: 1.0000 mg | INTRAVENOUS | Status: DC | PRN

## 2016-08-21 MED ORDER — ASPIRIN 325 MG PO TABS
325.0000 mg | ORAL_TABLET | ORAL | Status: DC
  Administered 2016-08-21 – 2016-08-22 (×2): 325 mg via ORAL
  Filled 2016-08-21 (×2): qty 1

## 2016-08-21 MED ORDER — SODIUM CHLORIDE 0.9 % IV SOLN
1.0000 g | Freq: Once | INTRAVENOUS | Status: AC
  Administered 2016-08-21: 1 g via INTRAVENOUS
  Filled 2016-08-21: qty 10

## 2016-08-21 MED ORDER — OXYCODONE-ACETAMINOPHEN 7.5-325 MG PO TABS: 1.0000 | ORAL_TABLET | Freq: Four times a day (QID) | ORAL | Status: DC | PRN

## 2016-08-21 MED ORDER — SODIUM CHLORIDE 0.9 % IV SOLN
125.0000 mg | INTRAVENOUS | Status: DC
  Administered 2016-08-21: 125 mg via INTRAVENOUS
  Filled 2016-08-21: qty 10

## 2016-08-21 MED ORDER — ACETAMINOPHEN 650 MG RE SUPP: 650.0000 mg | Freq: Four times a day (QID) | RECTAL | Status: DC | PRN

## 2016-08-21 MED ORDER — FLUTICASONE PROPIONATE 50 MCG/ACT NA SUSP
1.0000 | Freq: Every day | NASAL | Status: DC
  Administered 2016-08-21 – 2016-08-22 (×2): 1 via NASAL
  Filled 2016-08-21: qty 16

## 2016-08-21 MED ORDER — DOXERCALCIFEROL 4 MCG/2ML IV SOLN
2.5000 ug | INTRAVENOUS | Status: DC
  Administered 2016-08-21: 2.5 ug via INTRAVENOUS
  Filled 2016-08-21: qty 2

## 2016-08-21 MED ORDER — LABETALOL HCL 5 MG/ML IV SOLN: 5.0000 mg | INTRAVENOUS | Status: DC | PRN

## 2016-08-21 MED ORDER — ONDANSETRON HCL 4 MG PO TABS: 4.0000 mg | ORAL_TABLET | Freq: Four times a day (QID) | ORAL | Status: DC | PRN

## 2016-08-21 MED ORDER — LORAZEPAM 0.5 MG PO TABS
0.5000 mg | ORAL_TABLET | Freq: Once | ORAL | Status: AC
Start: 2016-08-21 — End: 2016-08-21
  Administered 2016-08-21: 0.5 mg via ORAL
  Filled 2016-08-21: qty 1

## 2016-08-21 MED ORDER — MORPHINE SULFATE (PF) 2 MG/ML IV SOLN
1.0000 mg | Freq: Once | INTRAVENOUS | Status: AC
Start: 2016-08-21 — End: 2016-08-21
  Administered 2016-08-21: 1 mg via INTRAVENOUS
  Filled 2016-08-21: qty 1

## 2016-08-21 MED ORDER — ONDANSETRON HCL 4 MG/2ML IJ SOLN: 4.0000 mg | Freq: Four times a day (QID) | INTRAMUSCULAR | Status: DC | PRN

## 2016-08-21 MED ORDER — DOCUSATE SODIUM 100 MG PO CAPS
100.0000 mg | ORAL_CAPSULE | Freq: Two times a day (BID) | ORAL | Status: DC
  Administered 2016-08-21 – 2016-08-22 (×2): 100 mg via ORAL
  Filled 2016-08-21 (×2): qty 1

## 2016-08-21 MED ORDER — ASPIRIN 81 MG PO CHEW
324.0000 mg | CHEWABLE_TABLET | Freq: Once | ORAL | Status: AC
  Administered 2016-08-21: 324 mg via ORAL
  Filled 2016-08-21: qty 4

## 2016-08-21 MED ORDER — NITROGLYCERIN 0.4 MG SL SUBL
0.4000 mg | SUBLINGUAL_TABLET | SUBLINGUAL | Status: DC | PRN
  Administered 2016-08-22: 0.4 mg via SUBLINGUAL
  Filled 2016-08-21: qty 3

## 2016-08-21 MED ORDER — ACETAMINOPHEN 325 MG PO TABS: 650.0000 mg | ORAL_TABLET | Freq: Four times a day (QID) | ORAL | Status: DC | PRN

## 2016-08-21 MED ORDER — ATORVASTATIN CALCIUM 10 MG PO TABS
10.0000 mg | ORAL_TABLET | Freq: Every day | ORAL | Status: DC
  Administered 2016-08-21: 10 mg via ORAL
  Filled 2016-08-21: qty 1

## 2016-08-21 MED ORDER — LABETALOL HCL 5 MG/ML IV SOLN
INTRAVENOUS | Status: AC
  Administered 2016-08-21: 10 mg via INTRAVENOUS
  Filled 2016-08-21: qty 4

## 2016-08-21 MED ORDER — NEPRO/CARBSTEADY PO LIQD
237.0000 mL | Freq: Every day | ORAL | Status: DC
  Administered 2016-08-22: 237 mL via ORAL

## 2016-08-21 MED ORDER — PREGABALIN 50 MG PO CAPS
75.0000 mg | ORAL_CAPSULE | Freq: Two times a day (BID) | ORAL | Status: DC
  Administered 2016-08-21 – 2016-08-22 (×3): 75 mg via ORAL
  Filled 2016-08-21 (×3): qty 1

## 2016-08-21 MED ORDER — MIDODRINE HCL 5 MG PO TABS
5.0000 mg | ORAL_TABLET | ORAL | Status: DC
  Filled 2016-08-21 (×3): qty 1

## 2016-08-21 MED ORDER — LABETALOL HCL 5 MG/ML IV SOLN
10.0000 mg | Freq: Once | INTRAVENOUS | Status: AC
  Administered 2016-08-21: 10 mg via INTRAVENOUS

## 2016-08-21 MED ORDER — SODIUM CHLORIDE 0.9% FLUSH
3.0000 mL | Freq: Two times a day (BID) | INTRAVENOUS | Status: DC
  Administered 2016-08-21 – 2016-08-22 (×2): 3 mL via INTRAVENOUS

## 2016-08-21 MED ORDER — ADULT MULTIVITAMIN W/MINERALS CH
1.0000 | ORAL_TABLET | Freq: Every day | ORAL | Status: DC
  Administered 2016-08-22: 1 via ORAL
  Filled 2016-08-21: qty 1

## 2016-08-21 MED ORDER — VANCOMYCIN HCL IN DEXTROSE 750-5 MG/150ML-% IV SOLN: 750.0000 mg | INTRAVENOUS | Status: DC

## 2016-08-21 MED ORDER — DARBEPOETIN ALFA 40 MCG/0.4ML IJ SOSY: 40.0000 ug | PREFILLED_SYRINGE | INTRAMUSCULAR | Status: DC

## 2016-08-21 NOTE — Progress Notes (Signed)
Post HD assessment unchanged. However patient breathing much better. No shortness of breath

## 2016-08-21 NOTE — Progress Notes (Signed)
[facility]  ROUNDING NOTE   Subjective:  Patient Presented to the emergency room via EMS for chest pain He also reports shortness of breath His potassium was found to be 6.2 Urgent dialysis is requested Patient is found to have an elevated troponin Chest x-ray shows vascular congestion  Objective:  Vital signs in last 24 hours:  Temp:  [97.5 F (36.4 C)-98.7 F (37.1 C)] 97.5 F (36.4 C) (03/28 1010) Pulse Rate:  [87-111] 92 (03/28 1100) Resp:  [9-27] 16 (03/28 1100) BP: (106-149)/(74-116) 118/88 (03/28 1100) SpO2:  [93 %-100 %] 99 % (03/28 1100) Weight:  [76.2 kg (168 lb)-83 kg (182 lb 15.7 oz)] 83 kg (182 lb 15.7 oz) (03/28 1010)  Weight change:  Filed Weights   08/21/16 0047 08/21/16 0421 08/21/16 1010  Weight: 76.2 kg (168 lb) 82.7 kg (182 lb 4.8 oz) 83 kg (182 lb 15.7 oz)    Intake/Output: I/O last 3 completed shifts: In: 100 [IV Piggyback:100] Out: -    Intake/Output this shift:  No intake/output data recorded.  Physical Exam: General: Laying in bed  Head: Normocephalic, atraumatic. Moist oral mucosal membranes  Eyes: Anicteric  Neck: Supple,    Lungs:  Slightly tachypnea, basilar crackles bilaterally   Heart: Regular rate and rhythm  Abdomen:  Soft, nontender  Extremities: Left BKA, right toe amputations  Neurologic: Nonfocal, moving all four extremities  Skin: No lesions  Access: IJ PC    Basic Metabolic Panel:  Recent Labs Lab 08/21/16 0049  NA 135  K 6.2*  CL 103  CO2 21*  GLUCOSE 101*  BUN 67*  CREATININE 11.19*  CALCIUM 9.3    Liver Function Tests: No results for input(s): AST, ALT, ALKPHOS, BILITOT, PROT, ALBUMIN in the last 168 hours. No results for input(s): LIPASE, AMYLASE in the last 168 hours. No results for input(s): AMMONIA in the last 168 hours.  CBC:  Recent Labs Lab 08/21/16 0049  WBC 7.0  HGB 10.3*  HCT 30.8*  MCV 93.5  PLT 174    Cardiac Enzymes:  Recent Labs Lab 08/21/16 0049 08/21/16 0653   TROPONINI 1.71* 1.69*    BNP: Invalid input(s): POCBNP  CBG: No results for input(s): GLUCAP in the last 168 hours.  Microbiology: Results for orders placed or performed during the hospital encounter of 07/26/16  Blood Culture (routine x 2)     Status: Abnormal   Collection Time: 07/26/16  8:08 AM  Result Value Ref Range Status   Specimen Description BLOOD LEFT ANTECUBITAL  Final   Special Requests BOTTLES DRAWN AEROBIC AND ANAEROBIC BCAV  Final   Culture  Setup Time   Final    GRAM POSITIVE COCCI ANAEROBIC BOTTLE ONLY CRITICAL VALUE NOTED.  VALUE IS CONSISTENT WITH PREVIOUSLY REPORTED AND CALLED VALUE.    Culture (A)  Final    STAPHYLOCOCCUS AUREUS SUSCEPTIBILITIES PERFORMED ON PREVIOUS CULTURE WITHIN THE LAST 5 DAYS. Performed at [facility], [place] [address] [place], [place] [address]    Report Status 07/29/2016 FINAL  Final  Blood Culture (routine x 2)     Status: Abnormal   Collection Time: 07/26/16  9:57 AM  Result Value Ref Range Status   Specimen Description BLOOD LEFT ARM  Final   Special Requests BOTTLES DRAWN AEROBIC AND ANAEROBIC BCHV  Final   Culture  Setup Time   Final    GRAM POSITIVE COCCI AEROBIC BOTTLE ONLY CRITICAL RESULT CALLED TO, READ BACK BY AND VERIFIED WITH: [name] COOKSON AT 2325 07/26/16.PMH Performed  at [facility], [place] [address], [place], [place] [address]    Culture METHICILLIN RESISTANT STAPHYLOCOCCUS AUREUS (A)  Final   Report Status 07/29/2016 FINAL  Final   Organism ID, Bacteria METHICILLIN RESISTANT STAPHYLOCOCCUS AUREUS  Final      Susceptibility   Methicillin resistant staphylococcus aureus - MIC*    CIPROFLOXACIN >=8 RESISTANT Resistant     ERYTHROMYCIN >=8 RESISTANT Resistant     GENTAMICIN <=0.5 SENSITIVE Sensitive     OXACILLIN >=4 RESISTANT Resistant     TETRACYCLINE <=1 SENSITIVE Sensitive     VANCOMYCIN 1 SENSITIVE Sensitive     TRIMETH/SULFA <=10 SENSITIVE Sensitive     CLINDAMYCIN RESISTANT Resistant      RIFAMPIN <=0.5 SENSITIVE Sensitive     Inducible Clindamycin POSITIVE Resistant     * METHICILLIN RESISTANT STAPHYLOCOCCUS AUREUS  Blood Culture ID Panel (Reflexed)     Status: Abnormal   Collection Time: 07/26/16  9:57 AM  Result Value Ref Range Status   Enterococcus species NOT DETECTED NOT DETECTED Final   Listeria monocytogenes NOT DETECTED NOT DETECTED Final   Staphylococcus species DETECTED (A) NOT DETECTED Final    Comment: CRITICAL RESULT CALLED TO, READ BACK BY AND VERIFIED WITH: [name] COOKSON AT 2325 07/26/16.PMH    Staphylococcus aureus DETECTED (A) NOT DETECTED Final    Comment: Methicillin (oxacillin)-resistant Staphylococcus aureus (MRSA). MRSA is predictably resistant to beta-lactam antibiotics (except ceftaroline). Preferred therapy is vancomycin unless clinically contraindicated. Patient requires contact precautions if  hospitalized. CRITICAL RESULT CALLED TO, READ BACK BY AND VERIFIED WITH: [name] COOKSON AT 2325 07/26/16.PMH    Methicillin resistance DETECTED (A) NOT DETECTED Final    Comment: CRITICAL RESULT CALLED TO, READ BACK BY AND VERIFIED WITH: [name] COOKSON AT 2325 07/26/16.PMH    Streptococcus species NOT DETECTED NOT DETECTED Final   Streptococcus agalactiae NOT DETECTED NOT DETECTED Final   Streptococcus pneumoniae NOT DETECTED NOT DETECTED Final   Streptococcus pyogenes NOT DETECTED NOT DETECTED Final   Acinetobacter baumannii NOT DETECTED NOT DETECTED Final   Enterobacteriaceae species NOT DETECTED NOT DETECTED Final   Enterobacter cloacae complex NOT DETECTED NOT DETECTED Final   Escherichia coli NOT DETECTED NOT DETECTED Final   Klebsiella oxytoca NOT DETECTED NOT DETECTED Final   Klebsiella pneumoniae NOT DETECTED NOT DETECTED Final   Proteus species NOT DETECTED NOT DETECTED Final   Serratia marcescens NOT DETECTED NOT DETECTED Final   Haemophilus influenzae NOT DETECTED NOT DETECTED Final   Neisseria meningitidis NOT DETECTED NOT DETECTED Final    Pseudomonas aeruginosa NOT DETECTED NOT DETECTED Final   Candida albicans NOT DETECTED NOT DETECTED Final   Candida glabrata NOT DETECTED NOT DETECTED Final   Candida krusei NOT DETECTED NOT DETECTED Final   Candida parapsilosis NOT DETECTED NOT DETECTED Final   Candida tropicalis NOT DETECTED NOT DETECTED Final  MRSA PCR Screening     Status: None   Collection Time: 07/27/16  1:10 AM  Result Value Ref Range Status   MRSA by PCR NEGATIVE NEGATIVE Final    Comment:        The GeneXpert MRSA Assay (FDA approved for NASAL specimens only), is one component of a comprehensive MRSA colonization surveillance program. It is not intended to diagnose MRSA infection nor to guide or monitor treatment for MRSA infections.   Cath Tip Culture     Status: Abnormal   Collection Time: 07/27/16 12:07 PM  Result Value Ref Range Status   Specimen Description CATH TIP  Final   Special  Requests NONE  Final   Culture (A)  Final    >=100,000 COLONIES/mL METHICILLIN RESISTANT STAPHYLOCOCCUS AUREUS   Report Status 07/30/2016 FINAL  Final   Organism ID, Bacteria METHICILLIN RESISTANT STAPHYLOCOCCUS AUREUS (A)  Final      Susceptibility   Methicillin resistant staphylococcus aureus - MIC*    CIPROFLOXACIN >=8 RESISTANT Resistant     ERYTHROMYCIN >=8 RESISTANT Resistant     GENTAMICIN <=0.5 SENSITIVE Sensitive     OXACILLIN >=4 RESISTANT Resistant     TETRACYCLINE <=1 SENSITIVE Sensitive     VANCOMYCIN 1 SENSITIVE Sensitive     TRIMETH/SULFA <=10 SENSITIVE Sensitive     CLINDAMYCIN RESISTANT Resistant     RIFAMPIN <=0.5 SENSITIVE Sensitive     Inducible Clindamycin POSITIVE Resistant     * >=100,000 COLONIES/mL METHICILLIN RESISTANT STAPHYLOCOCCUS AUREUS  CULTURE, BLOOD (ROUTINE X 2) w Reflex to ID Panel     Status: None   Collection Time: 07/28/16  1:59 PM  Result Value Ref Range Status   Specimen Description BLOOD  LEFT HAND  Final   Special Requests BOTTLES DRAWN AEROBIC AND ANAEROBIC  BCAV   Final   Culture NO GROWTH 5 DAYS  Final   Report Status 08/02/2016 FINAL  Final  CULTURE, BLOOD (ROUTINE X 2) w Reflex to ID Panel     Status: None   Collection Time: 07/28/16  2:10 PM  Result Value Ref Range Status   Specimen Description BLOOD  LEFT WRIST  Final   Special Requests BOTTLES DRAWN AEROBIC AND ANAEROBIC  BCAV  Final   Culture NO GROWTH 5 DAYS  Final   Report Status 08/02/2016 FINAL  Final    Coagulation Studies:  Recent Labs  08/21/16 0049  LABPROT 28.9*  INR 2.66    Urinalysis: No results for input(s): COLORURINE, LABSPEC, PHURINE, GLUCOSEU, HGBUR, BILIRUBINUR, KETONESUR, PROTEINUR, UROBILINOGEN, NITRITE, LEUKOCYTESUR in the last 72 hours.  Invalid input(s): APPERANCEUR    Imaging: Dg Chest Port 1 View  Result Date: 08/21/2016 CLINICAL DATA:  Acute onset of shortness of breath and generalized chest pain. Initial encounter. EXAM: PORTABLE CHEST 1 VIEW COMPARISON:  Chest radiograph and CTA of the chest performed 08/03/2016 FINDINGS: The lungs are well-aerated. A small right pleural effusion is noted. Increased interstitial markings raise concern for mild interstitial edema. No pneumothorax is seen. The cardiomediastinal silhouette is mildly enlarged. A left-sided dual-lumen catheter is noted ending about the right cavoatrial junction. No acute osseous abnormalities are seen. IMPRESSION: Small right pleural effusion. Increased interstitial markings raise concern for mild interstitial edema. Mild cardiomegaly noted. Electronically Signed   By: Garald Balding M.D.   On: 08/21/2016 01:38     Medications:   . heparin 1,000 Units/hr (08/21/16 0646)   . aspirin  325 mg Oral BH-q7a  . atorvastatin  10 mg Oral QHS  . calcium acetate  667 mg Oral TID WC  . calcium gluconate      . [START ON 08/26/2016] Darbepoetin Alfa  40 mcg Intravenous Q Mon-HD  . docusate sodium  100 mg Oral BID  . doxercalciferol  2.5 mcg Intravenous Q M,W,F-HD  . feeding supplement (NEPRO CARB  STEADY)  237 mL Oral Daily  . ferric gluconate (FERRLECIT/NULECIT) IV  125 mg Intravenous Q M,W,F-HD  . fluticasone  1 spray Each Nare Daily  . midodrine  5 mg Oral Q M,W,F-HD  . mometasone-formoterol  2 puff Inhalation BID  . multivitamin  1 tablet Oral QHS  . multivitamin with minerals  1 tablet  Oral Daily  . pantoprazole  40 mg Oral Daily  . polyethylene glycol  17 g Oral BH-q7a  . pregabalin  75 mg Oral BID  . sevelamer carbonate  800 mg Oral Q breakfast  . sodium chloride flush  3 mL Intravenous Q12H  . tiotropium  18 mcg Inhalation Daily     Assessment/ Plan:  [name] is a 61 y.o. black male with ESRD on hemodialysis, hypertension, hyperlipidemia, anemia, COPD, peripheral vascular disease, left BKA, right toe amputations, Pulm Embolism 07/2016 requiring Warfarin  MWF [facility].   1.  End stage renal disease -  With hyperkalemia - urgent treatment today HD MWF Patient seen during dialysis Tolerating well   HEMODIALYSIS FLOWSHEET:  Blood Flow Rate (mL/min): 350 mL/min Arterial Pressure (mmHg): -220 mmHg Venous Pressure (mmHg): 160 mmHg Transmembrane Pressure (mmHg): 60 mmHg Ultrafiltration Rate (mL/min): 710 mL/min Dialysate Flow Rate (mL/min): 800 ml/min Conductivity: Machine : 14.1 Conductivity: Machine : 14.1 Dialysis Fluid Bolus: Normal Saline Bolus Amount (mL): 250 mL Dialysate Change: 2K Intra-Hemodialysis Comments: 574 ml removed. Dr. Candiss Norse at bedside   2.  Anemia of chronic kidney disease: Hemoglobin found to be 10.3 - Hold EPO at present due to elevated troponin.  3.  Shortness of breath - UF with HD as tolerated Goal 2000 cc Patient want treatment for 3 hrs. Explained to stay for the longer treatment at 3.5 hrs for slower fluid removal  4. Secondary Hyperparathyroidism: - Phosphorus currently 6.0.  Continue calcium acetate as well as Renvela with food        LOS: 0 Georgina Krist 3/28/201811:05 AM

## 2016-08-21 NOTE — Progress Notes (Signed)
HD initiated. Patient + SOB, denies chest pain. HOB up. Sats 98% on 2L 02

## 2016-08-21 NOTE — Progress Notes (Signed)
Chaplain visited patient while patient was visiting with his sister and niece. Chaplain prayed for patient and his niece. Patient appreciated Chaplain's visit.   08/21/16 1400  Clinical Encounter Type  Visited With Patient  Visit Type Initial  Spiritual Encounters  Spiritual Needs Prayer;Emotional

## 2016-08-21 NOTE — Care Management (Addendum)
Patient admitted with nstemi from [facility].  Patient is DNR.  Patient may benefit from palliative care consulting at the skilled nursing facility for sx management.  ESRD with chronic dialysis.Notified [name] with [facility]. Cardiology consult is pending. Referral to CSW

## 2016-08-21 NOTE — Progress Notes (Signed)
Pharmacy Antibiotic Note  [name] is a 61 y.o. male admitted on 08/21/2016 with MRSA bacteremia 07/30/16 continue vanc x 4 weeks, end date 08/25/16.  Pharmacy has been consulted for vancomycin dosing.  Plan: Patient is just finishing dialysis. Will let come to equilibrium then check vanc random at 16:00 and redose as needed to maintain vanc level 15 to 20 mcg/mL.   End of therapy scheduled to be 08/25/16 (4 weeks from negative blood culture).   Height: 6' 2.5" (189.2 cm) Weight: 182 lb 15.7 oz (83 kg) IBW/kg (Calculated) : 83.35  Temp (24hrs), Avg:97.9 F (36.6 C), Min:97.5 F (36.4 C), Max:98.7 F (37.1 C)   Recent Labs Lab 08/21/16 0049  WBC 7.0  CREATININE 11.19*    Estimated Creatinine Clearance: 8 mL/min (A) (by C-G formula based on SCr of 11.19 mg/dL (H)).    Allergies  Allergen Reactions  . Dust Mite Extract Other (See Comments)    Reaction: unknown    Thank you for allowing pharmacy to be a part of this patient's care.  Laural Benes, Pharm.D., BCPS Clinical Pharmacist 08/21/2016 1:28 PM

## 2016-08-21 NOTE — Progress Notes (Signed)
Pre HD  

## 2016-08-21 NOTE — Progress Notes (Signed)
ANTICOAGULATION CONSULT NOTE - Initial Consult  Pharmacy Consult for heparin dosing Indication: chest pain/ACS  Allergies  Allergen Reactions  . Dust Mite Extract Other (See Comments)    Reaction: unknown    Patient Measurements: Height: 6' 2.5" (189.2 cm) Weight: 173 lb 4.8 oz (78.6 kg) IBW/kg (Calculated) : 83.35 Heparin Dosing Weight: 82.7 kg  Vital Signs: Temp: 98.8 F (37.1 C) (03/28 1951) Temp Source: Oral (03/28 1951) BP: 100/68 (03/28 1951) Pulse Rate: 91 (03/28 1951)  Labs:  Recent Labs  08/21/16 0049 08/21/16 0653 08/21/16 1245 08/21/16 1848 08/21/16 2046  HGB 10.3*  --   --   --   --   HCT 30.8*  --   --   --   --   PLT 174  --   --   --   --   APTT  --  87*  --   --   --   LABPROT 28.9*  --   --   --   --   INR 2.66  --   --   --   --   HEPARINUNFRC  --  <0.10* 0.44  --  0.34  CREATININE 11.19*  --   --   --   --   TROPONINI 1.71* 1.69* 1.80* 1.66*  --     Estimated Creatinine Clearance: 7.6 mL/min (A) (by C-G formula based on SCr of 11.19 mg/dL (H)).   Medical History: Past Medical History:  Diagnosis Date  . Anemia   . Asthma   . CHF (congestive heart failure) ([place])   . Chronic systolic heart failure ([place])   . Complication of anesthesia    hypotension  . COPD (chronic obstructive pulmonary disease) ([place])   . Coronary artery disease   . Dialysis patient ([place])    Mon, Wed, Fri  . End stage renal disease ([place])   . GERD (gastroesophageal reflux disease)   . Headache   . History of kidney stones   . HLD (hyperlipidemia)   . HTN (hypertension)   . Hyperparathyroidism   . Myocardial infarction   . Peripheral vascular disease ([place])   . Shortness of breath dyspnea   . Sleep apnea   . Tobacco dependence     Medications:  Scheduled:  . aspirin  325 mg Oral BH-q7a  . atorvastatin  10 mg Oral QHS  . calcium acetate  667 mg Oral TID WC  . [START ON 08/26/2016] Darbepoetin Alfa  40 mcg Intravenous Q Mon-HD  . docusate sodium  100 mg Oral  BID  . doxercalciferol  2.5 mcg Intravenous Q M,W,F-HD  . feeding supplement (NEPRO CARB STEADY)  237 mL Oral Daily  . ferric gluconate (FERRLECIT/NULECIT) IV  125 mg Intravenous Q M,W,F-HD  . fluticasone  1 spray Each Nare Daily  . midodrine  5 mg Oral Q M,W,F-HD  . mometasone-formoterol  2 puff Inhalation BID  . multivitamin  1 tablet Oral QHS  . multivitamin with minerals  1 tablet Oral Daily  . pantoprazole  40 mg Oral Daily  . polyethylene glycol  17 g Oral BH-q7a  . pregabalin  75 mg Oral BID  . sevelamer carbonate  800 mg Oral Q breakfast  . sodium chloride flush  3 mL Intravenous Q12H  . tiotropium  18 mcg Inhalation Daily  . vancomycin  1,000 mg Intravenous Once  . [START ON 08/23/2016] vancomycin  1,000 mg Intravenous Q M,W,F-HD    Assessment: Patient admitted for CP w/ trop 1.71. Patient to be  started on a heparin drip  Goal of Therapy:  Heparin level 0.3-0.7 units/ml Monitor platelets by anticoagulation protocol: Yes   Plan:  Baseline aPTT/HL ordered Give 4000 units bolus x 1  Will start heparin drip @ 1000 units/hour and check HL 3/28 @ 1230. Will monitor CBC  3/28 1245 HL therapeutic x 1. Continue current rate. Will recheck HL in 8 hours.   3/28:  HL @ 20:46 = 0.34 Will continue this pt on current rate and recheck HL on 3/29 with AM labs.   Thank you for this consult.  [place] D Clinical Pharmacist 08/21/2016

## 2016-08-21 NOTE — ED Notes (Signed)
Admitting provider at bedside.

## 2016-08-21 NOTE — Progress Notes (Signed)
ANTICOAGULATION CONSULT NOTE - Initial Consult  Pharmacy Consult for heparin dosing Indication: chest pain/ACS  Allergies  Allergen Reactions  . Dust Mite Extract Other (See Comments)    Reaction: unknown    Patient Measurements: Height: 6' 2.5" (189.2 cm) Weight: 182 lb 15.7 oz (83 kg) IBW/kg (Calculated) : 83.35 Heparin Dosing Weight: 82.7 kg  Vital Signs: Temp: 97.5 F (36.4 C) (03/28 1010) Temp Source: Oral (03/28 1010) BP: 109/86 (03/28 1325) Pulse Rate: 93 (03/28 1325)  Labs:  Recent Labs  08/21/16 0049 08/21/16 0653 08/21/16 1245  HGB 10.3*  --   --   HCT 30.8*  --   --   PLT 174  --   --   APTT  --  87*  --   LABPROT 28.9*  --   --   INR 2.66  --   --   HEPARINUNFRC  --  <0.10* 0.44  CREATININE 11.19*  --   --   TROPONINI 1.71* 1.69* 1.80*    Estimated Creatinine Clearance: 8 mL/min (A) (by C-G formula based on SCr of 11.19 mg/dL (H)).   Medical History: Past Medical History:  Diagnosis Date  . Anemia   . Asthma   . CHF (congestive heart failure) ([place])   . Chronic systolic heart failure ([place])   . Complication of anesthesia    hypotension  . COPD (chronic obstructive pulmonary disease) ([place])   . Coronary artery disease   . Dialysis patient ([place])    Mon, Wed, Fri  . End stage renal disease ([place])   . GERD (gastroesophageal reflux disease)   . Headache   . History of kidney stones   . HLD (hyperlipidemia)   . HTN (hypertension)   . Hyperparathyroidism   . Myocardial infarction   . Peripheral vascular disease ([place])   . Shortness of breath dyspnea   . Sleep apnea   . Tobacco dependence     Medications:  Scheduled:  . aspirin  325 mg Oral BH-q7a  . atorvastatin  10 mg Oral QHS  . calcium acetate  667 mg Oral TID WC  . calcium gluconate      . [START ON 08/26/2016] Darbepoetin Alfa  40 mcg Intravenous Q Mon-HD  . docusate sodium  100 mg Oral BID  . doxercalciferol  2.5 mcg Intravenous Q M,W,F-HD  . feeding supplement (NEPRO CARB STEADY)   237 mL Oral Daily  . ferric gluconate (FERRLECIT/NULECIT) IV  125 mg Intravenous Q M,W,F-HD  . fluticasone  1 spray Each Nare Daily  . midodrine  5 mg Oral Q M,W,F-HD  . mometasone-formoterol  2 puff Inhalation BID  . multivitamin  1 tablet Oral QHS  . multivitamin with minerals  1 tablet Oral Daily  . pantoprazole  40 mg Oral Daily  . polyethylene glycol  17 g Oral BH-q7a  . pregabalin  75 mg Oral BID  . sevelamer carbonate  800 mg Oral Q breakfast  . sodium chloride flush  3 mL Intravenous Q12H  . tiotropium  18 mcg Inhalation Daily  . [START ON 08/23/2016] vancomycin  750 mg Intravenous Q M,W,F-HD    Assessment: Patient admitted for CP w/ trop 1.71. Patient to be started on a heparin drip  Goal of Therapy:  Heparin level 0.3-0.7 units/ml Monitor platelets by anticoagulation protocol: Yes   Plan:  Baseline aPTT/HL ordered Give 4000 units bolus x 1  Will start heparin drip @ 1000 units/hour and check HL 3/28 @ 1230. Will monitor CBC  3/28 1245 HL  therapeutic x 1. Continue current rate. Will recheck HL in 8 hours.   Thank you for this consult.  Naythan Douthit A. Jordan Hawks, PharmD, BCPS Clinical Pharmacist 08/21/2016

## 2016-08-21 NOTE — Progress Notes (Deleted)
Pharmacy Antibiotic Note  [name] is a 61 y.o. male admitted on 08/21/2016 with MRSA bacteremia 07/30/16 continue vanc x 4 weeks, end date 08/25/16.  Pharmacy has been consulted to continue vancomycin dosing on admission.   Plan: Patient was receiving vancomycin 750 mg IV with HD on MWF prior to admission for bacteremia. Patient had HD session today and post-HD vancomycin level was 12 mcg/mL. Goal vancomycin trough 15-20 mcg/mL  Will give vancomycin 1000 mg this evening post-HD and order vancomycin 1000 mg IV to be given with HD on MWF.  End of therapy scheduled to be 08/25/16 (4 weeks from negative blood culture).  Scheduled next VR 4/4 1500 after dialysis session.   Height: 6' 2.5" (189.2 cm) Weight: 173 lb 4.8 oz (78.6 kg) IBW/kg (Calculated) : 83.35  Temp (24hrs), Avg:97.7 F (36.5 C), Min:97 F (36.1 C), Max:98.7 F (37.1 C)   Last Labs    Recent Labs Lab 08/21/16 0049 08/21/16 1517  WBC 7.0  --   CREATININE 11.19*  --   VANCORANDOM  --  12      Estimated Creatinine Clearance: 7.6 mL/min (A) (by C-G formula based on SCr of 11.19 mg/dL (H)).         Allergies  Allergen Reactions  . Dust Mite Extract Other (See Comments)    Reaction: unknown    Thank you for allowing pharmacy to be a part of this patient's care.  Tobie Lords, PharmD, BCPS Clinical Pharmacist 08/21/2016

## 2016-08-21 NOTE — Progress Notes (Signed)
Pharmacy Antibiotic Note  [name] is a 61 y.o. male admitted on 08/21/2016 with MRSA bacteremia 07/30/16 continue vanc x 4 weeks, end date 08/25/16.  Pharmacy has been consulted to continue vancomycin dosing on admission.   Plan: Patient was receiving vancomycin 750 mg IV with HD on MWF prior to admission for bacteremia. Patient had HD session today and post-HD vancomycin level was 12 mcg/mL. Goal vancomycin trough 15-20 mcg/mL  Will give vancomycin 1000 mg this evening post-HD and order vancomycin 1000 mg IV to be given with HD on MWF.  End of therapy scheduled to be 08/25/16 (4 weeks from negative blood culture).   Height: 6' 2.5" (189.2 cm) Weight: 173 lb 4.8 oz (78.6 kg) IBW/kg (Calculated) : 83.35  Temp (24hrs), Avg:97.7 F (36.5 C), Min:97 F (36.1 C), Max:98.7 F (37.1 C)   Recent Labs Lab 08/21/16 0049 08/21/16 1517  WBC 7.0  --   CREATININE 11.19*  --   VANCORANDOM  --  12    Estimated Creatinine Clearance: 7.6 mL/min (A) (by C-G formula based on SCr of 11.19 mg/dL (H)).    Allergies  Allergen Reactions  . Dust Mite Extract Other (See Comments)    Reaction: unknown    Thank you for allowing pharmacy to be a part of this patient's care.  Lenis Noon, Pharm.D., BCPS Clinical Pharmacist 08/21/2016 4:41 PM

## 2016-08-21 NOTE — Progress Notes (Signed)
Pre HD assessment  

## 2016-08-21 NOTE — Progress Notes (Signed)
[place] at [facility]                                                                                                                                                                                  Patient Demographics   [name], is a 61 y.o. male, DOB - [birth year], XTG:[id number]  Admit date - 08/21/2016   Admitting Physician Harrie Foreman, MD  Outpatient Primary MD for the patient is [place]   LOS - 0  Subjective: Patient admitted with chest pain and shortness of breath patient continues to be very dyspneic this morning. His potassium is high.    Review of Systems:   CONSTITUTIONAL: No documented fever. No fatigue, weakness. No weight gain, no weight loss.  EYES: No blurry or double vision.  ENT: No tinnitus. No postnasal drip. No redness of the oropharynx.  RESPIRATORY: No cough, no wheeze, no hemoptysis.Positive dyspnea.  CARDIOVASCULAR: No chest pain. No orthopnea. No palpitations. No syncope.  GASTROINTESTINAL: No nausea, no vomiting or diarrhea. No abdominal pain. No melena or hematochezia.  GENITOURINARY: No dysuria or hematuria.  ENDOCRINE: No polyuria or nocturia. No heat or cold intolerance.  HEMATOLOGY: No anemia. No bruising. No bleeding.  INTEGUMENTARY: No rashes. No lesions.  MUSCULOSKELETAL: No arthritis. No swelling. No gout.  NEUROLOGIC: No numbness, tingling, or ataxia. No seizure-type activity.  PSYCHIATRIC: No anxiety. No insomnia. No ADD.    Vitals:   Vitals:   08/21/16 1325 08/21/16 1333 08/21/16 1338 08/21/16 1411  BP: 109/86 109/86 117/85 116/85  Pulse: 93 91 89 95  Resp: 16 15 16 18   Temp:  97 F (36.1 C)  97.7 F (36.5 C)  TempSrc:  Oral  Oral  SpO2: 100% 100% 100% 99%  Weight:  178 lb 2.1 oz (80.8 kg)  173 lb 4.8 oz (78.6 kg)  Height:        Wt Readings from Last 3 Encounters:  08/21/16 173 lb 4.8 oz (78.6 kg)  08/07/16 185 lb 10 oz (84.2 kg)  07/31/16 178 lb (80.7 kg)      Intake/Output Summary (Last 24 hours) at 08/21/16 1556 Last data filed at 08/21/16 1333  Gross per 24 hour  Intake              100 ml  Output             2000 ml  Net            -1900 ml    Physical Exam:   GENERAL: Pleasant-appearing in no apparent distress.  HEAD, EYES, EARS, NOSE AND THROAT: Atraumatic, normocephalic. Extraocular muscles are intact. Pupils equal and reactive to light.  Sclerae anicteric. No conjunctival injection. No oro-pharyngeal erythema.  NECK: Supple. There is no jugular venous distention. No bruits, no lymphadenopathy, no thyromegaly.  HEART: Regular rate and rhythm,. No murmurs, no rubs, no clicks.  LUNGS: bilateral crackles throughout ABDOMEN: Soft, flat, nontender, nondistended. Has good bowel sounds. No hepatosplenomegaly appreciated.  EXTREMITIES: No evidence of any cyanosis, clubbing, or peripheral edema.  +2 pedal and radial pulses bilaterally.  NEUROLOGIC: The patient is alert, awake, and oriented x3 with no focal motor or sensory deficits appreciated bilaterally.  SKIN: Moist and warm with no rashes appreciated.  Psych: Not anxious, depressed LN: No inguinal LN enlargement    Antibiotics   Anti-infectives    Start     Dose/Rate Route Frequency Ordered Stop   08/23/16 1200  vancomycin (VANCOCIN) IVPB 750 mg/150 ml premix     750 mg 150 mL/hr over 60 Minutes Intravenous Every M-W-F (Hemodialysis) 08/21/16 1325        Medications   Scheduled Meds: . aspirin  325 mg Oral BH-q7a  . atorvastatin  10 mg Oral QHS  . calcium acetate  667 mg Oral TID WC  . [START ON 08/26/2016] Darbepoetin Alfa  40 mcg Intravenous Q Mon-HD  . docusate sodium  100 mg Oral BID  . doxercalciferol  2.5 mcg Intravenous Q M,W,F-HD  . feeding supplement (NEPRO CARB STEADY)  237 mL Oral Daily  . ferric gluconate (FERRLECIT/NULECIT) IV  125 mg Intravenous Q M,W,F-HD  . fluticasone  1 spray Each Nare Daily  . midodrine  5 mg Oral Q M,W,F-HD  . mometasone-formoterol   2 puff Inhalation BID  . multivitamin  1 tablet Oral QHS  . multivitamin with minerals  1 tablet Oral Daily  . pantoprazole  40 mg Oral Daily  . polyethylene glycol  17 g Oral BH-q7a  . pregabalin  75 mg Oral BID  . sevelamer carbonate  800 mg Oral Q breakfast  . sodium chloride flush  3 mL Intravenous Q12H  . tiotropium  18 mcg Inhalation Daily  . [START ON 08/23/2016] vancomycin  750 mg Intravenous Q M,W,F-HD   Continuous Infusions: . heparin 1,000 Units/hr (08/21/16 0646)   PRN Meds:.acetaminophen **OR** acetaminophen, ipratropium-albuterol, labetalol, morphine injection, nitroGLYCERIN, ondansetron **OR** ondansetron (ZOFRAN) IV, oxyCODONE-acetaminophen   Data Review:   Micro Results No results found for this or any previous visit (from the past 240 hour(s)).  Radiology Reports X-ray Chest Pa Or Ap  Result Date: 07/25/2016 CLINICAL DATA:  Preop vascular procedure EXAM: CHEST 1 VIEW COMPARISON:  05/21/2016 FINDINGS: Trace right pleural effusion. Bilateral mild interstitial thickening. No pneumothorax. No focal consolidation. Stable cardiomegaly. Dual-lumen right-sided central venous catheter in unchanged position. No acute osseous abnormality. IMPRESSION: Cardiomegaly with mild pulmonary vascular congestion. Electronically Signed   By: Kathreen Devoid   On: 07/25/2016 13:36   Dg Chest 2 View  Result Date: 08/03/2016 CLINICAL DATA:  Acute chest pain and shortness of breath. EXAM: CHEST  2 VIEW COMPARISON:  07/26/2016 and prior radiograph FINDINGS: Cardiomegaly and left central venous catheter with tip overlying the superior cavoatrial junction noted. A small right pleural effusion and right basilar atelectasis/scarring again noted. There is no evidence of pulmonary edema, left pleural effusion or pneumothorax. IMPRESSION: Cardiomegaly without evidence of acute cardiopulmonary disease. Small right pleural effusion and right basilar atelectasis/scarring again noted. Electronically Signed   By:  Margarette Canada M.D.   On: 08/03/2016 16:59   Ct Head Wo Contrast  Result Date: 07/27/2016 CLINICAL DATA:  Confusion, fever. EXAM: CT  HEAD WITHOUT CONTRAST TECHNIQUE: Contiguous axial images were obtained from the base of the skull through the vertex without intravenous contrast. COMPARISON:  None. FINDINGS: Brain: There is generalized parenchymal atrophy with commensurate dilatation of the ventricles and sulci. Mild chronic small vessel ischemic change noted within the periventricular white matter regions bilaterally. There is no mass, hemorrhage, edema or other evidence of acute parenchymal abnormality. No extra-axial hemorrhage. Vascular: There are chronic calcified atherosclerotic changes of the large vessels at the skull base. No unexpected hyperdense vessel. Skull: Normal. Negative for fracture or focal lesion. Sinuses/Orbits: Paranasal sinuses are clear. Periorbital and retro-orbital soft tissues are unremarkable. Other: None. IMPRESSION: 1. No acute findings.  No intracranial mass, hemorrhage or edema. 2. Atrophy and mild chronic small vessel ischemic changes in the periventricular white matter. Electronically Signed   By: Franki Cabot M.D.   On: 07/27/2016 18:21   Ct Angio Chest Pe W And/or Wo Contrast  Result Date: 08/03/2016 CLINICAL DATA:  61 year old male with chest pain and shortness of breath. History of end-stage renal disease and CHF. EXAM: CT ANGIOGRAPHY CHEST WITH CONTRAST TECHNIQUE: Multidetector CT imaging of the chest was performed using the standard protocol during bolus administration of intravenous contrast. Multiplanar CT image reconstructions and MIPs were obtained to evaluate the vascular anatomy. CONTRAST:  75 cc intravenous Isovue 370 COMPARISON:  08/03/2016 and prior chest radiographs. 07/06/2013 chest CT FINDINGS: Cardiovascular: Pulmonary emboli within the right upper lobe noted. RV/LV ratio is 0.72. Cardiomegaly and moderate to heavy coronary artery calcifications again noted.  Ectasia of the thoracic aorta noted without aneurysm. No pericardial effusion identified. A right subclavian stent is noted. A left IJ central venous catheter with tip at the superior cavoatrial junction noted. Mediastinum/Nodes: No mediastinal mass or enlarged lymph nodes. Lungs/Pleura: A small right pleural effusion noted with right lower lobe atelectasis/ scarring. No airspace disease or mass identified. There is no evidence of pneumothorax. Upper Abdomen: No acute abnormality. Musculoskeletal: No acute or suspicious focal abnormalities noted. Review of the MIP images confirms the above findings. IMPRESSION: Right upper lobe pulmonary emboli. No CT evidence of right heart strain. Small right pleural effusion and right lower lobe atelectasis/ scarring. Cardiomegaly and coronary artery disease. Critical Value/emergent results were called by telephone at the time of interpretation on 08/03/2016 at 7:52 pm to Dr. Merlyn Lot , who verbally acknowledged these results. Electronically Signed   By: Margarette Canada M.D.   On: 08/03/2016 19:56   US Venous Img Lower Bilateral  Result Date: 08/04/2016 CLINICAL DATA:  Two-day history of chest pain and shortness of breath. Pulmonary embolus by chest CT. EXAM: BILATERAL LOWER EXTREMITY VENOUS DOPPLER ULTRASOUND TECHNIQUE: Gray-scale sonography with graded compression, as well as color Doppler and duplex ultrasound were performed to evaluate the lower extremity deep venous systems from the level of the common femoral vein and including the common femoral, femoral, profunda femoral, popliteal and calf veins including the posterior tibial, peroneal and gastrocnemius veins when visible. The superficial great saphenous vein was also interrogated. Spectral Doppler was utilized to evaluate flow at rest and with distal augmentation maneuvers in the common femoral, femoral and popliteal veins. COMPARISON:  None. FINDINGS: RIGHT LOWER EXTREMITY Common Femoral Vein: Not well seen  secondary to overlying bandage material. Saphenofemoral Junction: Not well seen secondary to overlying bandage material. Profunda Femoral Vein: No evidence of thrombus. Normal compressibility and flow on color Doppler imaging. Femoral Vein: No evidence of thrombus. Normal compressibility, respiratory phasicity and response to augmentation. Popliteal Vein: No evidence  of thrombus. Normal compressibility, respiratory phasicity and response to augmentation. Calf Veins: Peroneal vein is difficult to visualize. Nonocclusive thrombus cannot be entirely excluded within this vessel. Superficial Great Saphenous Vein: Patent near the femoral vein confluence. LEFT LOWER EXTREMITY Common Femoral Vein: No evidence of thrombus. Normal compressibility. Saphenofemoral Junction: No evidence of thrombus. Normal compressibility and flow on color Doppler imaging. Profunda Femoral Vein: No evidence of thrombus. Normal compressibility and flow on color Doppler imaging. Femoral Vein: Nonocclusive intraluminal thrombus identified. Popliteal Vein: No evidence of thrombus.  Normal compressibility. Calf Veins: Not well seen in this patient status post left BKA. IMPRESSION: 1. Nonocclusive thrombus identified left femoral vein in this patient with documented history of pulmonary embolus on CT scan from yesterday. 2. Limited assessment of calf veins bilaterally. Thrombus within the posterior tibial and peroneal veins bilaterally cannot be excluded on this study. Electronically Signed   By: Misty Stanley M.D.   On: 08/04/2016 16:42   Dg Chest Port 1 View  Result Date: 08/21/2016 CLINICAL DATA:  Acute onset of shortness of breath and generalized chest pain. Initial encounter. EXAM: PORTABLE CHEST 1 VIEW COMPARISON:  Chest radiograph and CTA of the chest performed 08/03/2016 FINDINGS: The lungs are well-aerated. A small right pleural effusion is noted. Increased interstitial markings raise concern for mild interstitial edema. No pneumothorax  is seen. The cardiomediastinal silhouette is mildly enlarged. A left-sided dual-lumen catheter is noted ending about the right cavoatrial junction. No acute osseous abnormalities are seen. IMPRESSION: Small right pleural effusion. Increased interstitial markings raise concern for mild interstitial edema. Mild cardiomegaly noted. Electronically Signed   By: Garald Balding M.D.   On: 08/21/2016 01:38   Dg Chest Port 1 View  Result Date: 07/26/2016 CLINICAL DATA:  Chest pain.  Dialysis patient. EXAM: PORTABLE CHEST 1 VIEW COMPARISON:  07/25/2016 FINDINGS: Central venous catheter tip remains in the lower SVC unchanged. Small right pleural effusion unchanged. Mild bibasilar atelectasis unchanged. Decreased lung volume. Negative for edema. IMPRESSION: Small right pleural effusion and mild right lower lobe atelectasis unchanged. Hypoventilation with decreased lung volume. Electronically Signed   By: Franchot Gallo M.D.   On: 07/26/2016 07:17     CBC  Recent Labs Lab 08/21/16 0049  WBC 7.0  HGB 10.3*  HCT 30.8*  PLT 174  MCV 93.5  MCH 31.3  MCHC 33.5  RDW 17.4*    Chemistries   Recent Labs Lab 08/21/16 0049  NA 135  K 6.2*  CL 103  CO2 21*  GLUCOSE 101*  BUN 67*  CREATININE 11.19*  CALCIUM 9.3   ------------------------------------------------------------------------------------------------------------------ estimated creatinine clearance is 7.6 mL/min (A) (by C-G formula based on SCr of 11.19 mg/dL (H)). ------------------------------------------------------------------------------------------------------------------ No results for input(s): HGBA1C in the last 72 hours. ------------------------------------------------------------------------------------------------------------------ No results for input(s): CHOL, HDL, LDLCALC, TRIG, CHOLHDL, LDLDIRECT in the last 72  hours. ------------------------------------------------------------------------------------------------------------------  Recent Labs  08/21/16 0653  TSH 0.860   ------------------------------------------------------------------------------------------------------------------ No results for input(s): VITAMINB12, FOLATE, FERRITIN, TIBC, IRON, RETICCTPCT in the last 72 hours.  Coagulation profile  Recent Labs Lab 08/21/16 0049  INR 2.66    No results for input(s): DDIMER in the last 72 hours.  Cardiac Enzymes  Recent Labs Lab 08/21/16 0049 08/21/16 0653 08/21/16 1245  TROPONINI 1.71* 1.69* 1.80*   ------------------------------------------------------------------------------------------------------------------ Invalid input(s): POCBNP    Assessment & Plan   This is a 61 year old male admitted for chest pain. 1. Non-ST MI  EKG changes and elevated troponin. Cardiology has been consulted. For further recommendations Continue heparin  for now 2. CHF: Chronic and systolic patient will go hemodialysis.  3. End stage renal disease: Nephrology has been counseled to 4. Hypertension: Uncontrolled as expected with end-stage renal disease on chronic dialysis. Labetalol as needed to control pressure (the patient has a history of hypotension and is also on midodrine) 5. Recent MRSA septicemia continue vancomycin as previously 6. Recent history of PE in the  heparin drip now was on Coumadin at home 7. GI prophylaxis: Pantoprazole per home regimen The patient is a DO NOT RESUSCITATE. Time spent on admission orders and patient care approximately 45 minutes      Code Status Orders        Start     Ordered   08/21/16 0409  Do not attempt resuscitation (DNR)  Continuous    Question Answer Comment  In the event of cardiac or respiratory ARREST Do not call a "code blue"   In the event of cardiac or respiratory ARREST Do not perform Intubation, CPR, defibrillation or ACLS   In  the event of cardiac or respiratory ARREST Use medication by any route, position, wound care, and other measures to relive pain and suffering. May use oxygen, suction and manual treatment of airway obstruction as needed for comfort.      08/21/16 0408    Code Status History    Date Active Date Inactive Code Status Order ID Comments User Context   08/03/2016 11:19 PM 08/07/2016  7:28 PM DNR [id number]  Lance Coon, MD Inpatient   07/26/2016  5:07 PM 07/31/2016  9:58 PM DNR [id number]  Vaughan Basta, MD Inpatient   05/22/2016 12:34 AM 05/24/2016  7:11 PM Full Code [id number]  Harvie Bridge, DO Inpatient   01/13/2015  1:44 AM 01/14/2015  2:46 PM Full Code [id number]  Juluis Mire, MD ED   12/15/2014  9:32 AM 12/15/2014  2:31 PM Full Code [id number]  Algernon Huxley, MD Inpatient   05/06/2014  8:29 PM 05/10/2014  5:23 PM Full Code [id number]  Elam Dutch, MD Inpatient   05/04/2014 10:24 PM 05/06/2014  8:29 PM Full Code [id number]  Rise Patience, MD Inpatient           Consults  Nephrology, cardiogly DVT Prophylaxis  heparin  Lab Results  Component Value Date   PLT 174 08/21/2016     Time Spent in minutes   26min  Greater than 50% of time spent in care coordination and counseling patient regarding the condition and plan of care.   Dustin Flock M.D on 08/21/2016 at 3:56 PM  Between 7am to 6pm - Pager - [phone]  After 6pm go to [email or web address] - password [email or web address] [facility]   Office  [phone]

## 2016-08-21 NOTE — Care Management Important Message (Signed)
Important Message  Patient Details  Name: [name] MRN: [id number] Date of Birth: 12-Nov-1955   Medicare Important Message Given:  Yes Signed notice given to patient   Katrina Stack, RN 08/21/2016, 3:31 PM

## 2016-08-21 NOTE — ED Triage Notes (Signed)
Pt is from [facility] with c/o chest pain that felt "explosive" with increasing sob. Pt was given 3 nitro PTA by staff Denies pain now. Last dialysis treatment Monday. IV established en route. Hypertensive.

## 2016-08-21 NOTE — ED Provider Notes (Signed)
[facility] Emergency Department Provider Note    First MD Initiated Contact with Patient 08/21/16 0015     (approximate)  I have reviewed the triage vital signs and the nursing notes.   HISTORY  Chief Complaint Chest Pain    HPI [name] is a 61 y.o. male with bolus of chronic medical conditions including hypertension hyperlipidemia myocardial infarction, pulmonary emboli and CHF presents to the emergency department via EMS with history of chest pain (not really) area patient states approximately one hourpatient started to experience central chest pain that radiated to his left arm with a pain score of 10 out of 10. Patient denied any shortness of breath. Patient states pain now resolved status post 3 sublingual nitroglycerin.   Past Medical History:  Diagnosis Date  . Anemia   . Asthma   . CHF (congestive heart failure) ([place])   . Chronic systolic heart failure ([place])   . Complication of anesthesia    hypotension  . COPD (chronic obstructive pulmonary disease) ([place])   . Coronary artery disease   . Dialysis patient ([place])    Mon, Wed, Fri  . End stage renal disease ([place])   . GERD (gastroesophageal reflux disease)   . Headache   . History of kidney stones   . HLD (hyperlipidemia)   . HTN (hypertension)   . Hyperparathyroidism   . Myocardial infarction   . Peripheral vascular disease ([place])   . Shortness of breath dyspnea   . Sleep apnea   . Tobacco dependence     Patient Active Problem List   Diagnosis Date Noted  . Pulmonary embolism ([place]) 08/03/2016  . NSTEMI (non-ST elevated myocardial infarction) ([place]) 07/26/2016  . Acute respiratory failure with hypoxia ([place]) 05/24/2016  . Right lower lobe pneumonia ([place]) 05/24/2016  . Elevated troponin 05/24/2016  . Pulmonary edema 05/21/2016  . Kidney dialysis as the cause of abnormal reaction of the patient, or of later complication, without mention of misadventure at the time of the procedure  (CODE) 03/05/2016  . PVD (peripheral vascular disease) ([place]) 03/05/2016  . Preoperative cardiovascular examination 05/02/2015  . Chronic systolic heart failure ([place])   . Sepsis ([place]) 01/13/2015  . Acute encephalopathy 01/13/2015  . Hyperkalemia 01/13/2015  . Gangrene of foot ([place]) 05/04/2014  . ESRD (end stage renal disease) on dialysis ([place]) 05/04/2014  . CAD (coronary artery disease) 05/04/2014  . Normocytic anemia 05/04/2014  . Hyperlipidemia 04/26/2010  . HYPERTENSION, BENIGN 04/26/2010  . CAD, NATIVE VESSEL 04/26/2010  . End stage renal disease ([place]) 04/26/2010    Past Surgical History:  Procedure Laterality Date  . AMPUTATION Left 05/06/2014   Procedure: AMPUTATION BELOW KNEE;  Surgeon: Elam Dutch, MD;  Location: [place];  Service: Vascular;  Laterality: Left;  . AMPUTATION Right 01/12/2015   Procedure: Foot transmetatarsal amputation;  Surgeon: Algernon Huxley, MD;  Location: [facility];  Service: Vascular;  Laterality: Right;  . APPLICATION OF WOUND VAC Right 03/01/2015   Procedure: Application of Bio-connekt graft and wound vac application to right foot ;  Surgeon: Algernon Huxley, MD;  Location: [facility];  Service: Vascular;  Laterality: Right;  . AV FISTULA PLACEMENT Left   . CARDIAC CATHETERIZATION     stent placement   . CORONARY ANGIOPLASTY    . DIALYSIS/PERMA CATHETER INSERTION N/A 07/31/2016   Procedure: Dialysis/Perma Catheter Insertion;  Surgeon: Algernon Huxley, MD;  Location: [place] [facility];  Service: Cardiovascular;  Laterality: N/A;  . LIGATION OF  ARTERIOVENOUS  FISTULA Right 01/31/2016   Procedure: LIGATION OF ARTERIOVENOUS  FISTULA;  Surgeon: Algernon Huxley, MD;  Location: [facility];  Service: Vascular;  Laterality: Right;  . PERIPHERAL VASCULAR CATHETERIZATION Right 12/15/2014   Procedure: Lower Extremity Angiography;  Surgeon: Algernon Huxley, MD;  Location: [place] [facility];  Service: Cardiovascular;  Laterality: Right;  . PERIPHERAL VASCULAR CATHETERIZATION  12/15/2014     Procedure: Lower Extremity Intervention;  Surgeon: Algernon Huxley, MD;  Location: [place] [facility];  Service: Cardiovascular;;  . PERIPHERAL VASCULAR CATHETERIZATION Right 08/14/2015   Procedure: A/V Shuntogram/Fistulagram;  Surgeon: Algernon Huxley, MD;  Location: [place] [facility];  Service: Cardiovascular;  Laterality: Right;  . PERIPHERAL VASCULAR CATHETERIZATION N/A 08/14/2015   Procedure: A/V Shunt Intervention;  Surgeon: Algernon Huxley, MD;  Location: [place] [facility];  Service: Cardiovascular;  Laterality: N/A;  . PERIPHERAL VASCULAR CATHETERIZATION N/A 01/11/2016   Procedure: Dialysis/Perma Catheter Insertion;  Surgeon: Algernon Huxley, MD;  Location: [place] [facility];  Service: Cardiovascular;  Laterality: N/A;  . REVISON OF ARTERIOVENOUS FISTULA Right 02/17/2016   Procedure: removal of AV fistula;  Surgeon: Serafina Mitchell, MD;  Location: [facility];  Service: Vascular;  Laterality: Right;  . REVISON OF ARTERIOVENOUS FISTULA Right 01/31/2016   Procedure: REVISON OF ARTERIOVENOUS FISTULA ( BRACHIOCEPHALIC ) W/ ARTEGRAFT;  Surgeon: Algernon Huxley, MD;  Location: [facility];  Service: Vascular;  Laterality: Right;  . TRANSMETATARSAL AMPUTATION Right 05/04/2015   Procedure: TRANSMETATARSAL AMPUTATION REVISION, great toe amputation;  Surgeon: Algernon Huxley, MD;  Location: [facility];  Service: Vascular;  Laterality: Right;    Prior to Admission medications   Medication Sig Start Date End Date Taking? Authorizing Provider  acetaminophen (TYLENOL) 500 MG tablet Take 1,000 mg by mouth every 6 (six) hours as needed for moderate pain.    Historical Provider, MD  albuterol (PROVENTIL HFA;VENTOLIN HFA) 108 (90 BASE) MCG/ACT inhaler Inhale 2 puffs into the lungs every 6 (six) hours as needed for wheezing or shortness of breath.     Historical Provider, MD  aspirin 325 MG tablet Take 325 mg by mouth every morning.     Historical Provider, MD  atorvastatin (LIPITOR) 10 MG tablet Take 10 mg by mouth at bedtime.      Historical Provider, MD  budesonide-formoterol (SYMBICORT) 160-4.5 MCG/ACT inhaler Inhale 2 puffs into the lungs 2 (two) times daily. Reported on 08/14/2015    Historical Provider, MD  calcium acetate (PHOSLO) 667 MG capsule Take 667 mg by mouth 3 (three) times daily with meals.    Historical Provider, MD  Darbepoetin Alfa (ARANESP) 40 MCG/0.4ML SOSY injection Inject 0.4 mLs (40 mcg total) into the vein every Monday with hemodialysis. 05/16/14   Shanker Kristeen Mans, MD  docusate sodium (COLACE) 100 MG capsule Take 100 mg by mouth 2 (two) times daily as needed. Reported on 08/14/2015    Historical Provider, MD  doxercalciferol (HECTOROL) 4 MCG/2ML injection Inject 1.25 mLs (2.5 mcg total) into the vein every Monday, Wednesday, and Friday with hemodialysis. 05/10/14   Shanker Kristeen Mans, MD  ferric gluconate 125 mg in sodium chloride 0.9 % 100 mL Inject 125 mg into the vein every Monday, Wednesday, and Friday with hemodialysis. 05/10/14   Shanker Kristeen Mans, MD  fluticasone (FLONASE) 50 MCG/ACT nasal spray Place 1 spray into both nostrils daily.    Historical Provider, MD  ipratropium-albuterol (DUONEB) 0.5-2.5 (3) MG/3ML SOLN Take 3 mLs by nebulization every 4 (four) hours  as needed (SOB).    Historical Provider, MD  midodrine (PROAMATINE) 5 MG tablet Take 5 mg by mouth. 3 times a week, 30 minutes before dialysis    Historical Provider, MD  Multiple Vitamins-Minerals (MULTIVITAMIN WITH MINERALS) tablet Take 1 tablet by mouth daily.    Historical Provider, MD  multivitamin (RENA-VIT) TABS tablet Take 1 tablet by mouth at bedtime. 05/10/14   Shanker Kristeen Mans, MD  Nutritional Supplements (FEEDING SUPPLEMENT, NEPRO CARB STEADY,) LIQD Take 237 mLs by mouth daily.    Historical Provider, MD  oxyCODONE-acetaminophen (PERCOCET) 7.5-325 MG tablet Take 1 tablet by mouth every 4 (four) hours as needed for severe pain. 07/31/16   Loletha Grayer, MD  pantoprazole (PROTONIX) 20 MG tablet Take 20 mg by mouth daily.     Historical Provider, MD  polyethylene glycol (MIRALAX / GLYCOLAX) packet Take 17 g by mouth every morning. Hold for loose stools.    Historical Provider, MD  pregabalin (LYRICA) 75 MG capsule Take 75 mg by mouth 2 (two) times daily.    Historical Provider, MD  sevelamer carbonate (RENVELA) 800 MG tablet Take 800 mg by mouth daily.     Historical Provider, MD  simvastatin (ZOCOR) 20 MG tablet Take 20 mg by mouth daily.    Historical Provider, MD  tiotropium (SPIRIVA) 18 MCG inhalation capsule Place 18 mcg into inhaler and inhale daily.    Historical Provider, MD  Vancomycin [facility]) 750-5 MG/150ML-% SOLN With each dialysis, Tuesday, Thursday and Saturday. Last dose April 3rd 07/31/16   Loletha Grayer, MD  warfarin (COUMADIN) 5 MG tablet Take 1 tablet (5 mg total) by mouth daily at 6 PM. 08/07/16   Fritzi Mandes, MD    Allergies Dust mite extract  Family History  Problem Relation Age of Onset  . Heart failure Other   . Hypertension Other   . Leukemia Other   . Diabetes Other     Social History Social History  Substance Use Topics  . Smoking status: Light Tobacco Smoker    Packs/day: 0.25    Types: Cigarettes  . Smokeless tobacco: Never Used     Comment: 5  . Alcohol use No    Review of Systems Constitutional: No fever/chills Eyes: No visual changes. ENT: No sore throat. Cardiovascular:Positive for chest pain. Respiratory: Denies shortness of breath. Gastrointestinal: No abdominal pain.  No nausea, no vomiting.  No diarrhea.  No constipation. Genitourinary: Negative for dysuria. Musculoskeletal: Negative for back pain. Skin: Negative for rash. Neurological: Negative for headaches, focal weakness or numbness.  10-point ROS otherwise negative.  ____________________________________________   PHYSICAL EXAM:  VITAL SIGNS: ED Triage Vitals [08/21/16 0030]  Enc Vitals Group     BP (!) 148/114     Pulse Rate (!) 107     Resp (!) 24     Temp 98.7 F (37.1 C)     Temp  Source Oral     SpO2      Weight      Height      Head Circumference      Peak Flow      Pain Score      Pain Loc      Pain Edu?      Excl. in [place]?     Constitutional: Alert and oriented. Well appearing and in no acute distress. Eyes: Conjunctivae are normal. PERRL. EOMI. Head: Atraumatic. Nose: No congestion/rhinnorhea. Mouth/Throat: Mucous membranes are moist.  Oropharynx non-erythematous. Neck: No stridor.   Cardiovascular: Normal rate, regular rhythm. Good  peripheral circulation. Grossly normal heart sounds. Respiratory: Normal respiratory effort.  No retractions. Lungs CTAB. Gastrointestinal: Soft and nontender. No distention.  Musculoskeletal: No lower extremity tenderness nor edema. No gross deformities of extremities. Neurologic:  Normal speech and language. No gross focal neurologic deficits are appreciated.  Skin:  Skin is warm, dry and intact. No rash noted. Psychiatric: Mood and affect are normal. Speech and behavior are normal.  ____________________________________________   LABS (all labs ordered are listed, but only abnormal results are displayed)  Labs Reviewed  BASIC METABOLIC PANEL - Abnormal; Notable for the following:       Result Value   Potassium 6.2 (*)    CO2 21 (*)    Glucose, Bld 101 (*)    BUN 67 (*)    Creatinine, Ser 11.19 (*)    GFR calc non Af Amer 4 (*)    GFR calc Af Amer 5 (*)    All other components within normal limits  CBC - Abnormal; Notable for the following:    RBC 3.30 (*)    Hemoglobin 10.3 (*)    HCT 30.8 (*)    RDW 17.4 (*)    All other components within normal limits  TROPONIN I - Abnormal; Notable for the following:    Troponin I 1.71 (*)    All other components within normal limits  PROTIME-INR - Abnormal; Notable for the following:    Prothrombin Time 28.9 (*)    All other components within normal limits   ____________________________________________  EKG  ED ECG REPORT I, [place] Amarissa Koerner, the attending  physician, personally viewed and interpreted this ECG.   Date: 08/21/2016  EKG Time: 12:15 AM  Rate: 107  Rhythm: Sinus tachycardia  Axis: Left axis deviation  Intervals: Normal  ST&T Change: Lateral ST segment depression 1 aVL V5 V6  ____________________________________________  RADIOLOGY I, [place] Cliff Damiani, personally viewed and evaluated these images (plain radiographs) as part of my medical decision making, as well as reviewing the written report by the radiologist.  Dg Chest Port 1 View  Result Date: 08/21/2016 CLINICAL DATA:  Acute onset of shortness of breath and generalized chest pain. Initial encounter. EXAM: PORTABLE CHEST 1 VIEW COMPARISON:  Chest radiograph and CTA of the chest performed 08/03/2016 FINDINGS: The lungs are well-aerated. A small right pleural effusion is noted. Increased interstitial markings raise concern for mild interstitial edema. No pneumothorax is seen. The cardiomediastinal silhouette is mildly enlarged. A left-sided dual-lumen catheter is noted ending about the right cavoatrial junction. No acute osseous abnormalities are seen. IMPRESSION: Small right pleural effusion. Increased interstitial markings raise concern for mild interstitial edema. Mild cardiomegaly noted. Electronically Signed   By: Garald Balding M.D.   On: 08/21/2016 01:38      Procedures   ____________________________________________   INITIAL IMPRESSION / ASSESSMENT AND PLAN / ED COURSE  Pertinent labs & imaging results that were available during my care of the patient were reviewed by me and considered in my medical decision making (see chart for details).  61 year old woman presenting with chest pain currently resolved however was 10 out of 10 at maximum intensity. History concerning for possible cardiac etiology. EKG revealed lateral ST segment depressions troponin positive1.71. As such patient discussed with Dr. Marcille Blanco for hospital admission for further evaluation and  management.      ____________________________________________  FINAL CLINICAL IMPRESSION(S) / ED DIAGNOSES  Final diagnoses:  NSTEMI (non-ST elevated myocardial infarction) [facility])     MEDICATIONS GIVEN DURING THIS VISIT:  Medications  aspirin chewable tablet 324 mg (not administered)     NEW OUTPATIENT MEDICATIONS STARTED DURING THIS VISIT:  New Prescriptions   No medications on file    Modified Medications   No medications on file    Discontinued Medications   No medications on file     Note:  This document was prepared using Dragon voice recognition software and may include unintentional dictation errors.    Gregor Hams, MD 08/21/16 [phone]

## 2016-08-21 NOTE — H&P (Signed)
[name] is an 61 y.o. male.   Chief Complaint: Chest pain HPI: The patient with past medical history of end-stage renal disease on dialysis and chronic systolic heart failure presents emergency department complaining of chest pain. The patient states that he was sitting on his couch watching TV when his pain began. It was substernal and radiated slightly into his left chest. He denies nausea, vomiting or diaphoresis. The pain lasted for approximately 10 minutes. It resolved with nitroglycerin however the patient then became short of breath. He complained of dyspnea intermittently en route as well as while in the emergency department. Supplemental oxygen was placed which helped symptomatically only briefly. However, the patient responded well to Ativan and morphine. Laboratory evaluation revealed elevated troponin and EKG showed lateral ST-T wave changes which prompted the emergency department staff to call the hospitalist service for admission.  Past Medical History:  Diagnosis Date  . Anemia   . Asthma   . CHF (congestive heart failure) ([place])   . Chronic systolic heart failure ([place])   . Complication of anesthesia    hypotension  . COPD (chronic obstructive pulmonary disease) ([place])   . Coronary artery disease   . Dialysis patient ([place])    Mon, Wed, Fri  . End stage renal disease ([place])   . GERD (gastroesophageal reflux disease)   . Headache   . History of kidney stones   . HLD (hyperlipidemia)   . HTN (hypertension)   . Hyperparathyroidism   . Myocardial infarction   . Peripheral vascular disease ([place])   . Shortness of breath dyspnea   . Sleep apnea   . Tobacco dependence     Past Surgical History:  Procedure Laterality Date  . AMPUTATION Left 05/06/2014   Procedure: AMPUTATION BELOW KNEE;  Surgeon: Elam Dutch, MD;  Location: [place];  Service: Vascular;  Laterality: Left;  . AMPUTATION Right 01/12/2015   Procedure: Foot transmetatarsal amputation;  Surgeon: Algernon Huxley,  MD;  Location: [facility];  Service: Vascular;  Laterality: Right;  . APPLICATION OF WOUND VAC Right 03/01/2015   Procedure: Application of Bio-connekt graft and wound vac application to right foot ;  Surgeon: Algernon Huxley, MD;  Location: [facility];  Service: Vascular;  Laterality: Right;  . AV FISTULA PLACEMENT Left   . CARDIAC CATHETERIZATION     stent placement   . CORONARY ANGIOPLASTY    . DIALYSIS/PERMA CATHETER INSERTION N/A 07/31/2016   Procedure: Dialysis/Perma Catheter Insertion;  Surgeon: Algernon Huxley, MD;  Location: [place] [facility];  Service: Cardiovascular;  Laterality: N/A;  . LIGATION OF ARTERIOVENOUS  FISTULA Right 01/31/2016   Procedure: LIGATION OF ARTERIOVENOUS  FISTULA;  Surgeon: Algernon Huxley, MD;  Location: [facility];  Service: Vascular;  Laterality: Right;  . PERIPHERAL VASCULAR CATHETERIZATION Right 12/15/2014   Procedure: Lower Extremity Angiography;  Surgeon: Algernon Huxley, MD;  Location: [place] [facility];  Service: Cardiovascular;  Laterality: Right;  . PERIPHERAL VASCULAR CATHETERIZATION  12/15/2014   Procedure: Lower Extremity Intervention;  Surgeon: Algernon Huxley, MD;  Location: [place] [facility];  Service: Cardiovascular;;  . PERIPHERAL VASCULAR CATHETERIZATION Right 08/14/2015   Procedure: A/V Shuntogram/Fistulagram;  Surgeon: Algernon Huxley, MD;  Location: [place] [facility];  Service: Cardiovascular;  Laterality: Right;  . PERIPHERAL VASCULAR CATHETERIZATION N/A 08/14/2015   Procedure: A/V Shunt Intervention;  Surgeon: Algernon Huxley, MD;  Location: [place] [facility];  Service: Cardiovascular;  Laterality: N/A;  . PERIPHERAL VASCULAR CATHETERIZATION  N/A 01/11/2016   Procedure: Dialysis/Perma Catheter Insertion;  Surgeon: Algernon Huxley, MD;  Location: [place] [facility];  Service: Cardiovascular;  Laterality: N/A;  . REVISON OF ARTERIOVENOUS FISTULA Right 02/17/2016   Procedure: removal of AV fistula;  Surgeon: Serafina Mitchell, MD;  Location: [facility];  Service: Vascular;   Laterality: Right;  . REVISON OF ARTERIOVENOUS FISTULA Right 01/31/2016   Procedure: REVISON OF ARTERIOVENOUS FISTULA ( BRACHIOCEPHALIC ) W/ ARTEGRAFT;  Surgeon: Algernon Huxley, MD;  Location: [facility];  Service: Vascular;  Laterality: Right;  . TRANSMETATARSAL AMPUTATION Right 05/04/2015   Procedure: TRANSMETATARSAL AMPUTATION REVISION, great toe amputation;  Surgeon: Algernon Huxley, MD;  Location: [facility];  Service: Vascular;  Laterality: Right;    Family History  Problem Relation Age of Onset  . Heart failure Other   . Hypertension Other   . Leukemia Other   . Diabetes Other    Social History:  reports that he has been smoking Cigarettes.  He has been smoking about 0.25 packs per day. He has never used smokeless tobacco. He reports that he does not drink alcohol or use drugs.  Allergies:  Allergies  Allergen Reactions  . Dust Mite Extract Other (See Comments)    Reaction: unknown    Medications Prior to Admission  Medication Sig Dispense Refill  . acetaminophen (TYLENOL) 500 MG tablet Take 1,000 mg by mouth every 6 (six) hours as needed for mild pain.     [name] albuterol (PROVENTIL HFA;VENTOLIN HFA) 108 (90 BASE) MCG/ACT inhaler Inhale 2 puffs into the lungs every 6 (six) hours as needed for wheezing or shortness of breath.     [name] aspirin 325 MG tablet Take 325 mg by mouth every morning.     [name] atorvastatin (LIPITOR) 10 MG tablet Take 10 mg by mouth at bedtime.     . budesonide-formoterol (SYMBICORT) 160-4.5 MCG/ACT inhaler Inhale 2 puffs into the lungs 2 (two) times daily. Reported on 08/14/2015    . calcium acetate (PHOSLO) 667 MG capsule Take 667 mg by mouth 3 (three) times daily with meals.    . Darbepoetin Alfa (ARANESP) 40 MCG/0.4ML SOSY injection Inject 0.4 mLs (40 mcg total) into the vein every Monday with hemodialysis. 8.4 mL   . docusate sodium (COLACE) 100 MG capsule Take 100 mg by mouth 2 (two) times daily as needed for mild constipation. Reported on 08/14/2015    . doxercalciferol  (HECTOROL) 4 MCG/2ML injection Inject 1.25 mLs (2.5 mcg total) into the vein every Monday, Wednesday, and Friday with hemodialysis. 2 mL   . ferric gluconate 125 mg in sodium chloride 0.9 % 100 mL Inject 125 mg into the vein every Monday, Wednesday, and Friday with hemodialysis.    . fluticasone (FLONASE) 50 MCG/ACT nasal spray Place 1 spray into both nostrils daily.    [name] ipratropium-albuterol (DUONEB) 0.5-2.5 (3) MG/3ML SOLN Take 3 mLs by nebulization every 4 (four) hours as needed (SOB).    . midodrine (PROAMATINE) 5 MG tablet Take 5 mg by mouth 3 (three) times a week. 30 minutes before dialysis    . Multiple Vitamins-Minerals (MULTIVITAMIN WITH MINERALS) tablet Take 1 tablet by mouth daily.    . multivitamin (RENA-VIT) TABS tablet Take 1 tablet by mouth at bedtime.  0  . nitroGLYCERIN (NITROSTAT) 0.4 MG SL tablet Place 0.4 mg under the tongue every 5 (five) minutes as needed for chest pain.    . Nutritional Supplements (FEEDING SUPPLEMENT, NEPRO CARB STEADY,) LIQD Take 237 mLs by mouth  daily.    . oxyCODONE (OXY IR/ROXICODONE) 5 MG immediate release tablet Take 5 mg by mouth every 8 (eight) hours as needed for severe pain.    [name] oxyCODONE-acetaminophen (PERCOCET) 7.5-325 MG tablet Take 1 tablet by mouth every 4 (four) hours as needed for severe pain. 12 tablet 0  . pantoprazole (PROTONIX) 20 MG tablet Take 20 mg by mouth daily.    . polyethylene glycol (MIRALAX / GLYCOLAX) packet Take 17 g by mouth every morning. Hold for loose stools.    . pregabalin (LYRICA) 75 MG capsule Take 75 mg by mouth 2 (two) times daily.    . sevelamer carbonate (RENVELA) 800 MG tablet Take 800 mg by mouth daily.     . simvastatin (ZOCOR) 20 MG tablet Take 20 mg by mouth daily.    [name] tiotropium (SPIRIVA) 18 MCG inhalation capsule Place 18 mcg into inhaler and inhale daily.    . Vancomycin (VANCOCIN) 750-5 MG/150ML-% SOLN With each dialysis, Tuesday, Thursday and Saturday. Last dose April 3rd (Patient taking differently:  Inject 750 mg into the vein 3 (three) times a week. With each dialysis on Monday, Wednesday, Friday) 4000 mL   . warfarin (COUMADIN) 5 MG tablet Take 1 tablet (5 mg total) by mouth daily at 6 PM. 30 tablet 1    Results for orders placed or performed during the hospital encounter of 08/21/16 (from the past 48 hour(s))  Basic metabolic panel     Status: Abnormal   Collection Time: 08/21/16 12:49 AM  Result Value Ref Range   Sodium 135 135 - 145 mmol/L   Potassium 6.2 (H) 3.5 - 5.1 mmol/L   Chloride 103 101 - 111 mmol/L   CO2 21 (L) 22 - 32 mmol/L   Glucose, Bld 101 (H) 65 - 99 mg/dL   BUN 67 (H) 6 - 20 mg/dL   Creatinine, Ser 11.19 (H) 0.61 - 1.24 mg/dL   Calcium 9.3 8.9 - 10.3 mg/dL   GFR calc non Af Amer 4 (L) >60 mL/min   GFR calc Af Amer 5 (L) >60 mL/min    Comment: (NOTE) The eGFR has been calculated using the CKD EPI equation. This calculation has not been validated in all clinical situations. eGFR's persistently <60 mL/min signify possible Chronic Kidney Disease.    Anion gap 11 5 - 15  CBC     Status: Abnormal   Collection Time: 08/21/16 12:49 AM  Result Value Ref Range   WBC 7.0 3.8 - 10.6 K/uL   RBC 3.30 (L) 4.40 - 5.90 MIL/uL   Hemoglobin 10.3 (L) 13.0 - 18.0 g/dL   HCT 30.8 (L) 40.0 - 52.0 %   MCV 93.5 80.0 - 100.0 fL   MCH 31.3 26.0 - 34.0 pg   MCHC 33.5 32.0 - 36.0 g/dL   RDW 17.4 (H) 11.5 - 14.5 %   Platelets 174 150 - 440 K/uL  Troponin I     Status: Abnormal   Collection Time: 08/21/16 12:49 AM  Result Value Ref Range   Troponin I 1.71 (HH) <0.03 ng/mL    Comment: CRITICAL RESULT CALLED TO, READ BACK BY AND VERIFIED WITH KELLY GIBSON ON 08/21/16 AT 0132 [facility]   Protime-INR     Status: Abnormal   Collection Time: 08/21/16 12:49 AM  Result Value Ref Range   Prothrombin Time 28.9 (H) 11.4 - 15.2 seconds   INR 2.66   TSH     Status: None   Collection Time: 08/21/16  6:53 AM  Result Value  Ref Range   TSH 0.860 0.350 - 4.500 uIU/mL    Comment: Performed by a  3rd Generation assay with a functional sensitivity of <=0.01 uIU/mL.  Troponin I     Status: Abnormal   Collection Time: 08/21/16  6:53 AM  Result Value Ref Range   Troponin I 1.69 (HH) <0.03 ng/mL    Comment: CRITICAL VALUE NOTED. VALUE IS CONSISTENT WITH PREVIOUSLY REPORTED/CALLED VALUE/HKP  APTT     Status: Abnormal   Collection Time: 08/21/16  6:53 AM  Result Value Ref Range   aPTT 87 (H) 24 - 36 seconds    Comment:        IF BASELINE aPTT IS ELEVATED, SUGGEST PATIENT RISK ASSESSMENT BE USED TO DETERMINE APPROPRIATE ANTICOAGULANT THERAPY.   Heparin level (unfractionated)     Status: Abnormal   Collection Time: 08/21/16  6:53 AM  Result Value Ref Range   Heparin Unfractionated <0.10 (L) 0.30 - 0.70 IU/mL    Comment:        IF HEPARIN RESULTS ARE BELOW EXPECTED VALUES, AND PATIENT DOSAGE HAS BEEN CONFIRMED, SUGGEST FOLLOW UP TESTING OF ANTITHROMBIN III LEVELS.    Dg Chest Port 1 View  Result Date: 08/21/2016 CLINICAL DATA:  Acute onset of shortness of breath and generalized chest pain. Initial encounter. EXAM: PORTABLE CHEST 1 VIEW COMPARISON:  Chest radiograph and CTA of the chest performed 08/03/2016 FINDINGS: The lungs are well-aerated. A small right pleural effusion is noted. Increased interstitial markings raise concern for mild interstitial edema. No pneumothorax is seen. The cardiomediastinal silhouette is mildly enlarged. A left-sided dual-lumen catheter is noted ending about the right cavoatrial junction. No acute osseous abnormalities are seen. IMPRESSION: Small right pleural effusion. Increased interstitial markings raise concern for mild interstitial edema. Mild cardiomegaly noted. Electronically Signed   By: Garald Balding M.D.   On: 08/21/2016 01:38    Review of Systems  Constitutional: Negative for chills and fever.  HENT: Negative for sore throat and tinnitus.   Eyes: Negative for blurred vision and redness.  Respiratory: Positive for shortness of breath.  Negative for cough.   Cardiovascular: Positive for chest pain. Negative for palpitations, orthopnea and PND.  Gastrointestinal: Negative for abdominal pain, diarrhea, nausea and vomiting.  Genitourinary: Negative for dysuria, frequency and urgency.  Musculoskeletal: Negative for joint pain and myalgias.  Skin: Negative for rash.       No lesions  Neurological: Negative for speech change, focal weakness and weakness.  Endo/Heme/Allergies: Does not bruise/bleed easily.       No temperature intolerance  Psychiatric/Behavioral: Negative for depression and suicidal ideas.    Blood pressure (!) 125/97, pulse 95, temperature 97.5 F (36.4 C), temperature source Oral, resp. rate (!) 22, height 6' 2.5" (1.892 m), weight 82.7 kg (182 lb 4.8 oz), SpO2 100 %. Physical Exam  Vitals reviewed. Constitutional: He is oriented to person, place, and time. He appears well-developed and well-nourished. No distress.  HENT:  Head: Normocephalic and atraumatic.  Mouth/Throat: Oropharynx is clear and moist.  Eyes: Conjunctivae and EOM are normal. Pupils are equal, round, and reactive to light. No scleral icterus.  Neck: Normal range of motion. Neck supple. No JVD present. No tracheal deviation present. No thyromegaly present.  Cardiovascular: Normal rate and regular rhythm.  Exam reveals no gallop and no friction rub.   No murmur heard. Respiratory: Breath sounds normal. Tachypnea noted.  Dialysis catheter in place left chest wall; recently removed permanent catheter from right chest wall. Clean and dressed  GI: Soft.  Bowel sounds are normal. He exhibits no distension. There is no tenderness.  Genitourinary:  Genitourinary Comments: Deferred  Musculoskeletal: Normal range of motion. He exhibits no edema.  Left BKA  Lymphadenopathy:    He has no cervical adenopathy.  Neurological: He is alert and oriented to person, place, and time. No cranial nerve deficit.  Skin: Skin is warm and dry. No rash noted. No  erythema.  Psychiatric: He has a normal mood and affect. His behavior is normal. Judgment and thought content normal.     Assessment/Plan This is a 61 year old male admitted for chest pain. 1. Chest pain: EKG changes and elevated troponin. The latter are in the setting of end-stage renal disease without dialysis that is due today. Chest pain has resolved. Continue to monitor telemetry and follow cardiac enzymes. Cardiology has been consulted. Continue aspirin 2. CHF: Chronic and systolic. Chest x-ray shows increased interstitial markings concerning for mild interstitial edema. His shortness of breath likely comes from this edema and vascular congestion. Should resolve with dialysis. 3. End stage renal disease: On dialysis Monday Wednesday Friday. Consult nephrology for continuation of dialysis. Continue calcium binders as well as sensitizers. Patient receives IV iron infusions as well as metabolic stimulating factor while on dialysis. Hemoglobin is currently above 10 which is within acceptable guidelines for immediate of chronic kidney disease. (Also of note, the patient receives vancomycin on dialysis which I have not reordered; weight nephrology guidelines). 4. Hypertension: Uncontrolled as expected with end-stage renal disease on chronic dialysis. Labetalol as needed to control pressure (the patient has a history of hypotension and is also on midodrine) 5. Hyperlipidemia: Continue statin therapy 6. DVT prophylaxis: Heparin 7. GI prophylaxis: Pantoprazole per home regimen The patient is a DO NOT RESUSCITATE. Time spent on admission orders and patient care approximately 45 minutes  Harrie Foreman, MD 08/21/2016, 8:12 AM

## 2016-08-21 NOTE — Consult Note (Signed)
Reason for Consult: Chest pain elevated troponin Referring Physician: Dr. Rosilyn Mings hospitalist, [place] primary  [name] is an 61 y.o. male.  HPI: Patient is a 61 year old male history of end-stage renal disease chronic systolic heart failure hypertension hyperlipidemia shortness of breath peripheral vascular disease presented with chest pain. Patient has a history of GERD on Protonix therapy patient presented while at rest with significant chest discomfort which lasted about 10 minutes he came to emergency room was found to have slightly elevated troponins. DVT prophylaxis continue Coumadin. Patient states to be compliant with medications. She'll peripheral vascular disease. Left BKA  Past Medical History:  Diagnosis Date  . Anemia   . Asthma   . CHF (congestive heart failure) ([place])   . Chronic systolic heart failure ([place])   . Complication of anesthesia    hypotension  . COPD (chronic obstructive pulmonary disease) ([place])   . Coronary artery disease   . Dialysis patient ([place])    Mon, Wed, Fri  . End stage renal disease ([place])   . GERD (gastroesophageal reflux disease)   . Headache   . History of kidney stones   . HLD (hyperlipidemia)   . HTN (hypertension)   . Hyperparathyroidism   . Myocardial infarction   . Peripheral vascular disease ([place])   . Shortness of breath dyspnea   . Sleep apnea   . Tobacco dependence     Past Surgical History:  Procedure Laterality Date  . AMPUTATION Left 05/06/2014   Procedure: AMPUTATION BELOW KNEE;  Surgeon: Elam Dutch, MD;  Location: [place];  Service: Vascular;  Laterality: Left;  . AMPUTATION Right 01/12/2015   Procedure: Foot transmetatarsal amputation;  Surgeon: Algernon Huxley, MD;  Location: [facility];  Service: Vascular;  Laterality: Right;  . APPLICATION OF WOUND VAC Right 03/01/2015   Procedure: Application of Bio-connekt graft and wound vac application to right foot ;  Surgeon: Algernon Huxley, MD;   Location: [facility];  Service: Vascular;  Laterality: Right;  . AV FISTULA PLACEMENT Left   . CARDIAC CATHETERIZATION     stent placement   . CORONARY ANGIOPLASTY    . DIALYSIS/PERMA CATHETER INSERTION N/A 07/31/2016   Procedure: Dialysis/Perma Catheter Insertion;  Surgeon: Algernon Huxley, MD;  Location: [place] [facility];  Service: Cardiovascular;  Laterality: N/A;  . LIGATION OF ARTERIOVENOUS  FISTULA Right 01/31/2016   Procedure: LIGATION OF ARTERIOVENOUS  FISTULA;  Surgeon: Algernon Huxley, MD;  Location: [facility];  Service: Vascular;  Laterality: Right;  . PERIPHERAL VASCULAR CATHETERIZATION Right 12/15/2014   Procedure: Lower Extremity Angiography;  Surgeon: Algernon Huxley, MD;  Location: [place] [facility];  Service: Cardiovascular;  Laterality: Right;  . PERIPHERAL VASCULAR CATHETERIZATION  12/15/2014   Procedure: Lower Extremity Intervention;  Surgeon: Algernon Huxley, MD;  Location: [place] [facility];  Service: Cardiovascular;;  . PERIPHERAL VASCULAR CATHETERIZATION Right 08/14/2015   Procedure: A/V Shuntogram/Fistulagram;  Surgeon: Algernon Huxley, MD;  Location: [place] [facility];  Service: Cardiovascular;  Laterality: Right;  . PERIPHERAL VASCULAR CATHETERIZATION N/A 08/14/2015   Procedure: A/V Shunt Intervention;  Surgeon: Algernon Huxley, MD;  Location: [place] [facility];  Service: Cardiovascular;  Laterality: N/A;  . PERIPHERAL VASCULAR CATHETERIZATION N/A 01/11/2016   Procedure: Dialysis/Perma Catheter Insertion;  Surgeon: Algernon Huxley, MD;  Location: [place] [facility];  Service: Cardiovascular;  Laterality: N/A;  . REVISON OF ARTERIOVENOUS FISTULA Right 02/17/2016   Procedure: removal of AV fistula;  Surgeon: Serafina Mitchell, MD;  Location: [facility];  Service: Vascular;  Laterality: Right;  . REVISON OF ARTERIOVENOUS FISTULA Right 01/31/2016   Procedure: REVISON OF ARTERIOVENOUS FISTULA ( BRACHIOCEPHALIC ) W/ ARTEGRAFT;  Surgeon: Algernon Huxley, MD;  Location: [facility];  Service: Vascular;   Laterality: Right;  . TRANSMETATARSAL AMPUTATION Right 05/04/2015   Procedure: TRANSMETATARSAL AMPUTATION REVISION, great toe amputation;  Surgeon: Algernon Huxley, MD;  Location: [facility];  Service: Vascular;  Laterality: Right;    Family History  Problem Relation Age of Onset  . Heart failure Other   . Hypertension Other   . Leukemia Other   . Diabetes Other     Social History:  reports that he has been smoking Cigarettes.  He has been smoking about 0.25 packs per day. He has never used smokeless tobacco. He reports that he does not drink alcohol or use drugs.  Allergies:  Allergies  Allergen Reactions  . Dust Mite Extract Other (See Comments)    Reaction: unknown    Medications: I have reviewed the patient's current medications.  Results for orders placed or performed during the hospital encounter of 08/21/16 (from the past 48 hour(s))  Basic metabolic panel     Status: Abnormal   Collection Time: 08/21/16 12:49 AM  Result Value Ref Range   Sodium 135 135 - 145 mmol/L   Potassium 6.2 (H) 3.5 - 5.1 mmol/L   Chloride 103 101 - 111 mmol/L   CO2 21 (L) 22 - 32 mmol/L   Glucose, Bld 101 (H) 65 - 99 mg/dL   BUN 67 (H) 6 - 20 mg/dL   Creatinine, Ser 11.19 (H) 0.61 - 1.24 mg/dL   Calcium 9.3 8.9 - 10.3 mg/dL   GFR calc non Af Amer 4 (L) >60 mL/min   GFR calc Af Amer 5 (L) >60 mL/min    Comment: (NOTE) The eGFR has been calculated using the CKD EPI equation. This calculation has not been validated in all clinical situations. eGFR's persistently <60 mL/min signify possible Chronic Kidney Disease.    Anion gap 11 5 - 15  CBC     Status: Abnormal   Collection Time: 08/21/16 12:49 AM  Result Value Ref Range   WBC 7.0 3.8 - 10.6 K/uL   RBC 3.30 (L) 4.40 - 5.90 MIL/uL   Hemoglobin 10.3 (L) 13.0 - 18.0 g/dL   HCT 30.8 (L) 40.0 - 52.0 %   MCV 93.5 80.0 - 100.0 fL   MCH 31.3 26.0 - 34.0 pg   MCHC 33.5 32.0 - 36.0 g/dL   RDW 17.4 (H) 11.5 - 14.5 %   Platelets 174 150 - 440 K/uL   Troponin I     Status: Abnormal   Collection Time: 08/21/16 12:49 AM  Result Value Ref Range   Troponin I 1.71 (HH) <0.03 ng/mL    Comment: CRITICAL RESULT CALLED TO, READ BACK BY AND VERIFIED WITH KELLY GIBSON ON 08/21/16 AT 0132 [employer]   Protime-INR     Status: Abnormal   Collection Time: 08/21/16 12:49 AM  Result Value Ref Range   Prothrombin Time 28.9 (H) 11.4 - 15.2 seconds   INR 2.66   TSH     Status: None   Collection Time: 08/21/16  6:53 AM  Result Value Ref Range   TSH 0.860 0.350 - 4.500 uIU/mL    Comment: Performed by a 3rd Generation assay with a functional sensitivity of <=0.01 uIU/mL.  Troponin I     Status: Abnormal  Collection Time: 08/21/16  6:53 AM  Result Value Ref Range   Troponin I 1.69 (HH) <0.03 ng/mL    Comment: CRITICAL VALUE NOTED. VALUE IS CONSISTENT WITH PREVIOUSLY REPORTED/CALLED VALUE/HKP  APTT     Status: Abnormal   Collection Time: 08/21/16  6:53 AM  Result Value Ref Range   aPTT 87 (H) 24 - 36 seconds    Comment:        IF BASELINE aPTT IS ELEVATED, SUGGEST PATIENT RISK ASSESSMENT BE USED TO DETERMINE APPROPRIATE ANTICOAGULANT THERAPY.   Heparin level (unfractionated)     Status: Abnormal   Collection Time: 08/21/16  6:53 AM  Result Value Ref Range   Heparin Unfractionated <0.10 (L) 0.30 - 0.70 IU/mL    Comment:        IF HEPARIN RESULTS ARE BELOW EXPECTED VALUES, AND PATIENT DOSAGE HAS BEEN CONFIRMED, SUGGEST FOLLOW UP TESTING OF ANTITHROMBIN III LEVELS.   Heparin level (unfractionated)     Status: None   Collection Time: 08/21/16 12:45 PM  Result Value Ref Range   Heparin Unfractionated 0.44 0.30 - 0.70 IU/mL    Comment:        IF HEPARIN RESULTS ARE BELOW EXPECTED VALUES, AND PATIENT DOSAGE HAS BEEN CONFIRMED, SUGGEST FOLLOW UP TESTING OF ANTITHROMBIN III LEVELS.   Troponin I     Status: Abnormal   Collection Time: 08/21/16 12:45 PM  Result Value Ref Range   Troponin I 1.80 (HH) <0.03 ng/mL    Comment: CRITICAL VALUE NOTED.  VALUE IS CONSISTENT WITH PREVIOUSLY REPORTED/CALLED VALUE/HKP  Vancomycin, random     Status: None   Collection Time: 08/21/16  3:17 PM  Result Value Ref Range   Vancomycin Rm 12     Comment:        Random Vancomycin therapeutic range is dependent on dosage and time of specimen collection. A peak range is 20.0-40.0 ug/mL A trough range is 5.0-15.0 ug/mL          Troponin I     Status: Abnormal   Collection Time: 08/21/16  6:48 PM  Result Value Ref Range   Troponin I 1.66 (HH) <0.03 ng/mL    Comment: CRITICAL VALUE NOTED. VALUE IS CONSISTENT WITH PREVIOUSLY REPORTED/CALLED VALUE. [place].    Dg Chest Port 1 View  Result Date: 08/21/2016 CLINICAL DATA:  Acute onset of shortness of breath and generalized chest pain. Initial encounter. EXAM: PORTABLE CHEST 1 VIEW COMPARISON:  Chest radiograph and CTA of the chest performed 08/03/2016 FINDINGS: The lungs are well-aerated. A small right pleural effusion is noted. Increased interstitial markings raise concern for mild interstitial edema. No pneumothorax is seen. The cardiomediastinal silhouette is mildly enlarged. A left-sided dual-lumen catheter is noted ending about the right cavoatrial junction. No acute osseous abnormalities are seen. IMPRESSION: Small right pleural effusion. Increased interstitial markings raise concern for mild interstitial edema. Mild cardiomegaly noted. Electronically Signed   By: Garald Balding M.D.   On: 08/21/2016 01:38    Review of Systems  Constitutional: Positive for malaise/fatigue.  Eyes: Negative.   Respiratory: Positive for shortness of breath.   Cardiovascular: Positive for chest pain, palpitations and PND.  Gastrointestinal: Negative.   Genitourinary: Negative.   Musculoskeletal: Positive for myalgias.  Skin: Negative.   Neurological: Positive for weakness.  Endo/Heme/Allergies: Negative.   Psychiatric/Behavioral: Negative.    Blood pressure 100/68, pulse 91, temperature 98.8 F (37.1 C), temperature  source Oral, resp. rate 14, height 6' 2.5" (1.892 m), weight 78.6 kg (173 lb 4.8 oz), SpO2 100 %.  Physical Exam  Nursing note and vitals reviewed. Constitutional: He is oriented to person, place, and time. He appears well-developed and well-nourished.  HENT:  Head: Normocephalic and atraumatic.  Eyes: Conjunctivae and EOM are normal. Pupils are equal, round, and reactive to light.  Neck: Normal range of motion. Neck supple.  Cardiovascular: Normal rate and regular rhythm.   Murmur heard. Respiratory: Effort normal and breath sounds normal.  GI: Soft. Bowel sounds are normal.  Musculoskeletal: Normal range of motion. He exhibits deformity.  Left BKA  Neurological: He is alert and oriented to person, place, and time. He has normal reflexes.  Skin: Skin is warm and dry.  Psychiatric: He has a normal mood and affect.    Assessment/Plan: Chest pain End-stage renal disease on dialysis Chronic systolic heart failure Anemia Asthma  COPD GERD Hyperlipidemia  hypertension Shortness of breath  Obstructive sleep apnea  Smoking . Plan Agree with admission rule out for myocardial infarction   follow-up cardiac enzymes Continue blood pressure control   recommend continue long-term anticoagulation   continue dialysis for end-stage renal disease Maintain treatment for chronic systolic heart failure  Lipitor for lipid management Continue Inhalers for COPD Agree with Protonix for reflux symptoms Corrected electrolytes Elevated troponin probably demand ischemia as well as renal failure Consider functional study possibly has an inpatient or out      Perrin Gens D Veyda Kaufman 08/21/2016, 8:33 PM

## 2016-08-21 NOTE — Clinical Social Work Note (Addendum)
CSW received consult that patient is from [facility] as a long term care resident, CSW to meet with patient at a later time.  Jones Broom. Norval Morton, MSW, [place]  08/21/2016 4:27 PM

## 2016-08-21 NOTE — ED Notes (Signed)
ED Provider at bedside. 

## 2016-08-21 NOTE — Progress Notes (Signed)
ANTICOAGULATION CONSULT NOTE - Initial Consult  Pharmacy Consult for heparin dosing Indication: chest pain/ACS  Allergies  Allergen Reactions  . Dust Mite Extract Other (See Comments)    Reaction: unknown    Patient Measurements: Height: 6' 2.5" (189.2 cm) Weight: 182 lb 4.8 oz (82.7 kg) IBW/kg (Calculated) : 83.35 Heparin Dosing Weight: 82.7 kg  Vital Signs: Temp: 97.5 F (36.4 C) (03/28 0420) Temp Source: Oral (03/28 0420) BP: 125/97 (03/28 0420) Pulse Rate: 95 (03/28 0420)  Labs:  Recent Labs  08/21/16 0049  HGB 10.3*  HCT 30.8*  PLT 174  LABPROT 28.9*  INR 2.66  CREATININE 11.19*  TROPONINI 1.71*    Estimated Creatinine Clearance: 8 mL/min (A) (by C-G formula based on SCr of 11.19 mg/dL (H)).   Medical History: Past Medical History:  Diagnosis Date  . Anemia   . Asthma   . CHF (congestive heart failure) ([place])   . Chronic systolic heart failure ([place])   . Complication of anesthesia    hypotension  . COPD (chronic obstructive pulmonary disease) ([place])   . Coronary artery disease   . Dialysis patient ([place])    Mon, Wed, Fri  . End stage renal disease ([place])   . GERD (gastroesophageal reflux disease)   . Headache   . History of kidney stones   . HLD (hyperlipidemia)   . HTN (hypertension)   . Hyperparathyroidism   . Myocardial infarction   . Peripheral vascular disease ([place])   . Shortness of breath dyspnea   . Sleep apnea   . Tobacco dependence     Medications:  Scheduled:  . aspirin  325 mg Oral BH-q7a  . atorvastatin  10 mg Oral QHS  . calcium acetate  667 mg Oral TID WC  . calcium gluconate      . [START ON 08/26/2016] Darbepoetin Alfa  40 mcg Intravenous Q Mon-HD  . docusate sodium  100 mg Oral BID  . doxercalciferol  2.5 mcg Intravenous Q M,W,F-HD  . feeding supplement (NEPRO CARB STEADY)  237 mL Oral Daily  . ferric gluconate (FERRLECIT/NULECIT) IV  125 mg Intravenous Q M,W,F-HD  . fluticasone  1 spray Each Nare Daily  . heparin  4,000  Units Intravenous Once  . midodrine  5 mg Oral Q M,W,F-HD  . mometasone-formoterol  2 puff Inhalation BID  . multivitamin  1 tablet Oral QHS  . multivitamin with minerals  1 tablet Oral Daily  . pantoprazole  40 mg Oral Daily  . polyethylene glycol  17 g Oral BH-q7a  . pregabalin  75 mg Oral BID  . sevelamer carbonate  800 mg Oral Q breakfast  . sodium chloride flush  3 mL Intravenous Q12H  . tiotropium  18 mcg Inhalation Daily    Assessment: Patient admitted for CP w/ trop 1.71. Patient to be started on a heparin drip  Goal of Therapy:  Heparin level 0.3-0.7 units/ml Monitor platelets by anticoagulation protocol: Yes   Plan:  Baseline aPTT/HL ordered Give 4000 units bolus x 1  Will start heparin drip @ 1000 units/hour and check HL 3/28 @ 1230. Will monitor CBC  Thank you for this consult.  Tobie Lords, PharmD, BCPS Clinical Pharmacist 08/21/2016

## 2016-08-21 NOTE — Plan of Care (Signed)
Problem: Pain Managment: Goal: General experience of comfort will improve Outcome: Progressing No complaints of pain since pt got to floor from ED, morphine was given in ED before pt came to the floor for pain, will continue to monitor.

## 2016-08-21 NOTE — Progress Notes (Signed)
Pt returns from HD. VSS, no complaints of pain, SR on telemetry. Pt states his breathing is much better. I will continue to assess.

## 2016-08-22 LAB — BASIC METABOLIC PANEL
ANION GAP: 9 (ref 5–15)
BUN: 53 mg/dL — ABNORMAL HIGH (ref 6–20)
CALCIUM: 8.8 mg/dL — AB (ref 8.9–10.3)
CHLORIDE: 100 mmol/L — AB (ref 101–111)
CO2: 26 mmol/L (ref 22–32)
Creatinine, Ser: 8.37 mg/dL — ABNORMAL HIGH (ref 0.61–1.24)
GFR calc non Af Amer: 6 mL/min — ABNORMAL LOW (ref >60)
GFR, EST AFRICAN AMERICAN: 7 mL/min — AB (ref >60)
GLUCOSE: 100 mg/dL — AB (ref 65–99)
POTASSIUM: 5.4 mmol/L — AB (ref 3.5–5.1)
Sodium: 135 mmol/L (ref 135–145)

## 2016-08-22 LAB — HEPARIN LEVEL (UNFRACTIONATED): Heparin Unfractionated: 0.26 IU/mL — ABNORMAL LOW (ref 0.30–0.70)

## 2016-08-22 LAB — HEMOGLOBIN A1C
Hgb A1c MFr Bld: 5.1 % (ref 4.8–5.6)
Mean Plasma Glucose: 100 mg/dL

## 2016-08-22 MED ORDER — HEPARIN BOLUS VIA INFUSION
1200.0000 [IU] | Freq: Once | INTRAVENOUS | Status: AC
  Administered 2016-08-22: 1200 [IU] via INTRAVENOUS
  Filled 2016-08-22: qty 1200

## 2016-08-22 MED ORDER — ISOSORBIDE MONONITRATE ER 30 MG PO TB24
30.0000 mg | ORAL_TABLET | Freq: Every day | ORAL | Status: DC
  Administered 2016-08-22: 30 mg via ORAL
  Filled 2016-08-22: qty 1

## 2016-08-22 MED ORDER — WARFARIN SODIUM 5 MG PO TABS: 5.0000 mg | ORAL_TABLET | Freq: Every day | ORAL | Status: DC

## 2016-08-22 MED ORDER — ISOSORBIDE MONONITRATE ER 30 MG PO TB24
30.0000 mg | ORAL_TABLET | Freq: Every day | ORAL | 2 refills | Status: DC
Start: 2016-08-22 — End: 2017-01-21

## 2016-08-22 MED ORDER — WARFARIN - PHARMACIST DOSING INPATIENT: Freq: Every day | Status: DC

## 2016-08-22 NOTE — Progress Notes (Signed)
New referral for Palliative services at [facility] following discharge received from [facility]. Plan is for discharge today. Referral made aware. Thank you. Flo Shanks RN, BSN, [place] and [facility] liaison [phone] c

## 2016-08-22 NOTE — Care Management (Signed)
Notified [name] with [facility] of discharge back to snf

## 2016-08-22 NOTE — Progress Notes (Signed)
ANTICOAGULATION CONSULT NOTE - Initial Consult  Pharmacy Consult for heparin dosing Indication: chest pain/ACS       Allergies  Allergen Reactions  . Dust Mite Extract Other (See Comments)    Reaction: unknown    Patient Measurements: Height: 6' 2.5" (189.2 cm) Weight: 173 lb 4.8 oz (78.6 kg) IBW/kg (Calculated) : 83.35 Heparin Dosing Weight: 82.7 kg  Vital Signs: Temp: 98.8 F (37.1 C) (03/28 1951) Temp Source: Oral (03/28 1951) BP: 100/68 (03/28 1951) Pulse Rate: 91 (03/28 1951)  Labs:  Recent Labs (last 2 labs)    Recent Labs  08/21/16 0049 08/21/16 0653 08/21/16 1245 08/21/16 1848 08/21/16 2046  HGB 10.3*  --   --   --   --   HCT 30.8*  --   --   --   --   PLT 174  --   --   --   --   APTT  --  87*  --   --   --   LABPROT 28.9*  --   --   --   --   INR 2.66  --   --   --   --   HEPARINUNFRC  --  <0.10* 0.44  --  0.34  CREATININE 11.19*  --   --   --   --   TROPONINI 1.71* 1.69* 1.80* 1.66*  --       Estimated Creatinine Clearance: 7.6 mL/min (A) (by C-G formula based on SCr of 11.19 mg/dL (H)).   Medical History:     Past Medical History:  Diagnosis Date  . Anemia   . Asthma   . CHF (congestive heart failure) ([place])   . Chronic systolic heart failure ([place])   . Complication of anesthesia    hypotension  . COPD (chronic obstructive pulmonary disease) ([place])   . Coronary artery disease   . Dialysis patient ([place])    Mon, Wed, Fri  . End stage renal disease ([place])   . GERD (gastroesophageal reflux disease)   . Headache   . History of kidney stones   . HLD (hyperlipidemia)   . HTN (hypertension)   . Hyperparathyroidism   . Myocardial infarction   . Peripheral vascular disease ([place])   . Shortness of breath dyspnea   . Sleep apnea   . Tobacco dependence     Medications:  Scheduled:  . aspirin  325 mg Oral BH-q7a  . atorvastatin  10 mg Oral QHS  . calcium acetate  667 mg Oral TID WC  . [START ON  08/26/2016] Darbepoetin Alfa  40 mcg Intravenous Q Mon-HD  . docusate sodium  100 mg Oral BID  . doxercalciferol  2.5 mcg Intravenous Q M,W,F-HD  . feeding supplement (NEPRO CARB STEADY)  237 mL Oral Daily  . ferric gluconate (FERRLECIT/NULECIT) IV  125 mg Intravenous Q M,W,F-HD  . fluticasone  1 spray Each Nare Daily  . midodrine  5 mg Oral Q M,W,F-HD  . mometasone-formoterol  2 puff Inhalation BID  . multivitamin  1 tablet Oral QHS  . multivitamin with minerals  1 tablet Oral Daily  . pantoprazole  40 mg Oral Daily  . polyethylene glycol  17 g Oral BH-q7a  . pregabalin  75 mg Oral BID  . sevelamer carbonate  800 mg Oral Q breakfast  . sodium chloride flush  3 mL Intravenous Q12H  . tiotropium  18 mcg Inhalation Daily  . vancomycin  1,000 mg Intravenous Once  . [START ON 08/23/2016] vancomycin  1,000 mg Intravenous Q M,W,F-HD    Assessment: Patient admitted for CP w/ trop 1.71. Patient to be started on a heparin drip  Goal of Therapy:  Heparin level 0.3-0.7 units/ml Monitor platelets by anticoagulation protocol: Yes   Plan:  Baseline aPTT/HL ordered Give 4000 units bolus x 1  Will start heparin drip @ 1000 units/hour and check HL 3/28 @ 1230. Will monitor CBC  3/28 1245 HL therapeutic x 1. Continue current rate. Will recheck HL in 8 hours.   3/28:  HL @ 20:46 = 0.34 Will continue this pt on current rate and recheck HL on 3/29 with AM labs.   3/29 @ 0415 HL 0.26 subtherapeutic. Will bolus w/ 1200 units IV bolus x 1 then increase drip rate to heparin 1100 units/hour. Will recheck HL 1200.  Thank you for this consult.  Tobie Lords, PharmD, BCPS Clinical Pharmacist 08/22/2016

## 2016-08-22 NOTE — Clinical Social Work Note (Addendum)
Clinical Social Work Assessment  Patient Details  Name: [name] MRN: [id number] Date of Birth: 1955-06-29  Date of referral:  08/22/16               Reason for consult:  Facility Placement                Permission sought to share information with:  Family Supports, Customer service manager Permission granted to share information::  Yes, Verbal Permission Granted  Name::     Hanley Seamen [phone]  Agency::  [facility] admissions  Relationship::     Contact Information:     Housing/Transportation Living arrangements for the past 2 months:  [place] of Information:  Patient, Medical Team Patient Interpreter Needed:  None Criminal Activity/Legal Involvement Pertinent to Current Situation/Hospitalization:  No - Comment as needed Significant Relationships:  Other Family Members Lives with:  Facility Resident Do you feel safe going back to the place where you live?  Yes Need for family participation in patient care:  No (Coment)  Care giving concerns:  Patient did not express any concerns about returning back to SNF.   Social Worker assessment / plan: Patient is a 61 year old male who has been a long term care resident at [facility] care for a few months.  Patient states he plans to return he did not express any concerns or issues about returning back to facility.  Patient was not seen by PT this admission.  Patient agrees to returning back to SNF.  Employment status:  Retired Forensic scientist:  Information systems manager, Medicaid In [place] PT Recommendations:  Not assessed at this time [place] / Referral to community resources:  [facility]  Patient/Family's Response to care:  Patient and family agreeable to returning back to SNF.  Patient/Family's Understanding of and Emotional Response to Diagnosis, Current Treatment, and Prognosis:  Patient is aware of current treatment plan and prognosis.  Emotional Assessment Appearance:   Appears stated age Attitude/Demeanor/Rapport:    Affect (typically observed):  Appropriate, Calm Orientation:  Oriented to Self, Oriented to Place, Oriented to  Time, Oriented to Situation Alcohol / Substance use:  Not Applicable Psych involvement (Current and /or in the community):  No (Comment)  Discharge Needs  Concerns to be addressed:  No discharge needs identified Readmission within the last 30 days:  Yes (08-07-16 to [facility] SNF.) Current discharge risk:  Chronically ill Barriers to Discharge:  No Barriers Identified   Ross Ludwig, LCSWA 08/22/2016, 2:19 PM

## 2016-08-22 NOTE — Progress Notes (Signed)
Patient given discharge teaching and paperwork regarding medications, diet, follow-up appointments and activity. Patient understanding verbalized. No complaints at this time. IV and telemetry discontinued prior to leaving - waiting for EMS to arrive. Skin assessment as previously charted and vitals are stable; on room air. Patient being discharged to [facility] - report called to [employer].  No further needs by Care Management/Social Work. Prescriptions and DNR form in packet prepared by CSW, Randall Hiss.

## 2016-08-22 NOTE — NC FL2 (Signed)
[place] LEVEL OF CARE SCREENING TOOL     IDENTIFICATION  Patient Name: [name] Birthdate: December 14, 1955 Sex: male Admission Date (Current Location): 08/21/2016  [place] and [place] Number:  [name] [id number] [place] and Address:  [facility], [address] [place], [place] [address]      Provider Number: [id number]  Attending Physician Name and Address:  Demetrios Loll, MD  Relative Name and Phone Number:  Alfred Levins Sister [phone]     Current Level of Care: Hospital Recommended Level of Care: [place] Prior Approval Number:    Date Approved/Denied:   PASRR Number: [id number]  Discharge Plan: SNF    Current Diagnoses: Patient Active Problem List   Diagnosis Date Noted  . Pulmonary embolism ([place]) 08/03/2016  . NSTEMI (non-ST elevated myocardial infarction) ([place]) 07/26/2016  . Acute respiratory failure with hypoxia ([place]) 05/24/2016  . Right lower lobe pneumonia ([place]) 05/24/2016  . Elevated troponin 05/24/2016  . Pulmonary edema 05/21/2016  . Kidney dialysis as the cause of abnormal reaction of the patient, or of later complication, without mention of misadventure at the time of the procedure (CODE) 03/05/2016  . PVD (peripheral vascular disease) ([place]) 03/05/2016  . Preoperative cardiovascular examination 05/02/2015  . Chronic systolic heart failure ([place])   . Sepsis ([place]) 01/13/2015  . Acute encephalopathy 01/13/2015  . Hyperkalemia 01/13/2015  . Gangrene of foot ([place]) 05/04/2014  . ESRD (end stage renal disease) on dialysis ([place]) 05/04/2014  . CAD (coronary artery disease) 05/04/2014  . Normocytic anemia 05/04/2014  . Hyperlipidemia 04/26/2010  . HYPERTENSION, BENIGN 04/26/2010  . CAD, NATIVE VESSEL 04/26/2010  . End stage renal disease ([place]) 04/26/2010    Orientation RESPIRATION BLADDER Height & Weight     Self, Time, Situation, Place  O2 (2L) Continent Weight: 177 lb 6.4 oz (80.5  kg) Height:  6' 2.5" (189.2 cm)  BEHAVIORAL SYMPTOMS/MOOD NEUROLOGICAL BOWEL NUTRITION STATUS      Continent Diet (Renal Diet Fluid Restrictions)  AMBULATORY STATUS COMMUNICATION OF NEEDS Skin   Limited Assist Verbally Surgical wounds                       Personal Care Assistance Level of Assistance  Bathing, Feeding, Dressing Bathing Assistance: Limited assistance Feeding assistance: Independent Dressing Assistance: Limited assistance     Functional Limitations Info  Sight, Hearing, Speech Sight Info: Adequate Hearing Info: Adequate Speech Info: Adequate    SPECIAL CARE FACTORS FREQUENCY                       Contractures Contractures Info: Not present    Additional Factors Info  Isolation Precautions Code Status Info: DNR Allergies Info: Dust Mite Extract     Isolation Precautions Info:  (MRSA)     Current Medications (08/22/2016):  This is the current hospital active medication list Current Facility-Administered Medications  Medication Dose Route Frequency Provider Last Rate Last Dose  . acetaminophen (TYLENOL) tablet 650 mg  650 mg Oral Q6H PRN Harrie Foreman, MD       Or  . acetaminophen (TYLENOL) suppository 650 mg  650 mg Rectal Q6H PRN Harrie Foreman, MD      . aspirin tablet 325 mg  325 mg Oral Edwinna Areola, MD   325 mg at 08/22/16 0919  . atorvastatin (LIPITOR) tablet 10 mg  10 mg Oral QHS Harrie Foreman, MD   10 mg at 08/21/16 2121  .  calcium acetate (PHOSLO) capsule 667 mg  667 mg Oral TID WC Harrie Foreman, MD   667 mg at 08/22/16 0920  . [START ON 08/26/2016] Darbepoetin Alfa (ARANESP) injection 40 mcg  40 mcg Intravenous Q Mon-HD Harrie Foreman, MD      . docusate sodium (COLACE) capsule 100 mg  100 mg Oral BID Harrie Foreman, MD   100 mg at 08/22/16 0920  . doxercalciferol (HECTOROL) injection 2.5 mcg  2.5 mcg Intravenous Q M,W,F-HD Harrie Foreman, MD   2.5 mcg at 08/21/16 1152  . feeding supplement (NEPRO CARB  STEADY) liquid 237 mL  237 mL Oral Daily Harrie Foreman, MD   237 mL at 08/22/16 0920  . ferric gluconate (NULECIT) 125 mg in sodium chloride 0.9 % 100 mL IVPB  125 mg Intravenous Q M,W,F-HD Harrie Foreman, MD   125 mg at 08/21/16 1152  . fluticasone (FLONASE) 50 MCG/ACT nasal spray 1 spray  1 spray Each Nare Daily Harrie Foreman, MD   1 spray at 08/22/16 0920  . heparin ADULT infusion 100 units/mL (25000 units/247mL sodium chloride 0.45%)  1,100 Units/hr Intravenous Continuous Demetrios Loll, MD 11 mL/hr at 08/22/16 0555 1,100 Units/hr at 08/22/16 0555  . ipratropium-albuterol (DUONEB) 0.5-2.5 (3) MG/3ML nebulizer solution 3 mL  3 mL Nebulization Q4H PRN Harrie Foreman, MD      . isosorbide mononitrate (IMDUR) 24 hr tablet 30 mg  30 mg Oral Daily Demetrios Loll, MD      . labetalol (NORMODYNE,TRANDATE) injection 5-10 mg  5-10 mg Intravenous Q2H PRN Harrie Foreman, MD      . mometasone-formoterol [facility]) 200-5 MCG/ACT inhaler 2 puff  2 puff Inhalation BID Harrie Foreman, MD   2 puff at 08/22/16 [phone]  . morphine 4 MG/ML injection 1 mg  1 mg Intravenous Q4H PRN Harrie Foreman, MD      . multivitamin (RENA-VIT) tablet 1 tablet  1 tablet Oral QHS Harrie Foreman, MD   1 tablet at 08/21/16 2121  . multivitamin with minerals tablet 1 tablet  1 tablet Oral Daily Harrie Foreman, MD   1 tablet at 08/22/16 0919  . nitroGLYCERIN (NITROSTAT) SL tablet 0.4 mg  0.4 mg Sublingual Q5 min PRN Harrie Foreman, MD   0.4 mg at 08/22/16 0830  . ondansetron (ZOFRAN) tablet 4 mg  4 mg Oral Q6H PRN Harrie Foreman, MD       Or  . ondansetron [facility]) injection 4 mg  4 mg Intravenous Q6H PRN Harrie Foreman, MD      . oxyCODONE-acetaminophen (PERCOCET) 7.5-325 MG per tablet 1 tablet  1 tablet Oral Q6H PRN Harrie Foreman, MD      . pantoprazole (PROTONIX) EC tablet 40 mg  40 mg Oral Daily Harrie Foreman, MD   40 mg at 08/22/16 0920  . polyethylene glycol (MIRALAX / GLYCOLAX) packet 17 g  17 g  Oral Edwinna Areola, MD      . pregabalin (LYRICA) capsule 75 mg  75 mg Oral BID Harrie Foreman, MD   75 mg at 08/22/16 0919  . sevelamer carbonate (RENVELA) tablet 800 mg  800 mg Oral Q breakfast Harrie Foreman, MD   800 mg at 08/22/16 0920  . sodium chloride flush (NS) 0.9 % injection 3 mL  3 mL Intravenous Q12H Harrie Foreman, MD   3 mL at 08/22/16 0921  . tiotropium (SPIRIVA) inhalation capsule 18 mcg  18 mcg Inhalation Daily Harrie Foreman, MD   18 mcg at 08/22/16 5852  . [START ON 08/23/2016] vancomycin (VANCOCIN) IVPB 1000 mg/200 mL premix  1,000 mg Intravenous Q M,W,F-HD Lenis Noon, RPH      . warfarin (COUMADIN) tablet 5 mg  5 mg Oral q1800 Demetrios Loll, MD      . Warfarin - Pharmacist Dosing Inpatient   Does not apply [id number] Demetrios Loll, MD         Discharge Medications: Please see discharge summary for a list of discharge medications.  Relevant Imaging Results:  Relevant Lab Results:   Additional Information SSN [id number]  Ross Ludwig, [place]

## 2016-08-22 NOTE — Plan of Care (Signed)
Problem: Pain Managment: Goal: General experience of comfort will improve Outcome: Progressing No complaints of pain this shift. Will continue to monitor.  Problem: Tissue Perfusion: Goal: Risk factors for ineffective tissue perfusion will decrease Outcome: Progressing Heparin gtt @ 74ml/hr  Problem: Activity: Goal: Risk for activity intolerance will decrease Outcome: Progressing Up to [facility] this shift, tolerated well.

## 2016-08-22 NOTE — Progress Notes (Signed)
[facility]  ROUNDING NOTE   Subjective:  Patient  Underwent urgent HD yesterday Feels well this morning No SOB or chest pain 2000 cc was removed with HD  Objective:  Vital signs in last 24 hours:  Temp:  [97.7 F (36.5 C)-98.8 F (37.1 C)] 98.4 F (36.9 C) (03/29 1214) Pulse Rate:  [91-108] 100 (03/29 1214) Resp:  [14-19] 19 (03/29 0422) BP: (100-118)/(68-90) 109/68 (03/29 1214) SpO2:  [92 %-100 %] 96 % (03/29 1214) Weight:  [78.6 kg (173 lb 4.8 oz)-80.5 kg (177 lb 6.4 oz)] 80.5 kg (177 lb 6.4 oz) (03/29 0422)  Weight change: 6.796 kg (14 lb 15.7 oz) Filed Weights   08/21/16 1333 08/21/16 1411 08/22/16 0422  Weight: 80.8 kg (178 lb 2.1 oz) 78.6 kg (173 lb 4.8 oz) 80.5 kg (177 lb 6.4 oz)    Intake/Output: I/O last 3 completed shifts: In: 619.8 [I.V.:209.8; IV Piggyback:410] Out: 2001 [Other:2000; Stool:1]   Intake/Output this shift:  No intake/output data recorded.  Physical Exam: General: Laying in bed  Head: Normocephalic, atraumatic. Moist oral mucosal membranes  Eyes: Anicteric  Neck: Supple,    Lungs:  Clear b/l  Heart: Regular rate and rhythm  Abdomen:  Soft, nontender  Extremities: Left BKA, right toe amputations  Neurologic: Nonfocal, moving all four extremities  Skin: No lesions  Access: IJ PC    Basic Metabolic Panel:  Recent Labs Lab 08/21/16 0049 08/22/16 0415  NA 135 135  K 6.2* 5.4*  CL 103 100*  CO2 21* 26  GLUCOSE 101* 100*  BUN 67* 53*  CREATININE 11.19* 8.37*  CALCIUM 9.3 8.8*    Liver Function Tests: No results for input(s): AST, ALT, ALKPHOS, BILITOT, PROT, ALBUMIN in the last 168 hours. No results for input(s): LIPASE, AMYLASE in the last 168 hours. No results for input(s): AMMONIA in the last 168 hours.  CBC:  Recent Labs Lab 08/21/16 0049  WBC 7.0  HGB 10.3*  HCT 30.8*  MCV 93.5  PLT 174    Cardiac Enzymes:  Recent Labs Lab 08/21/16 0049 08/21/16 0653 08/21/16 1245 08/21/16 1848  TROPONINI  1.71* 1.69* 1.80* 1.66*    BNP: Invalid input(s): POCBNP  CBG: No results for input(s): GLUCAP in the last 168 hours.  Microbiology: Results for orders placed or performed during the hospital encounter of 07/26/16  Blood Culture (routine x 2)     Status: Abnormal   Collection Time: 07/26/16  8:08 AM  Result Value Ref Range Status   Specimen Description BLOOD LEFT ANTECUBITAL  Final   Special Requests BOTTLES DRAWN AEROBIC AND ANAEROBIC BCAV  Final   Culture  Setup Time   Final    GRAM POSITIVE COCCI ANAEROBIC BOTTLE ONLY CRITICAL VALUE NOTED.  VALUE IS CONSISTENT WITH PREVIOUSLY REPORTED AND CALLED VALUE.    Culture (A)  Final    STAPHYLOCOCCUS AUREUS SUSCEPTIBILITIES PERFORMED ON PREVIOUS CULTURE WITHIN THE LAST 5 DAYS. Performed at [facility], [place] [address] [place], [place] [address]    Report Status 07/29/2016 FINAL  Final  Blood Culture (routine x 2)     Status: Abnormal   Collection Time: 07/26/16  9:57 AM  Result Value Ref Range Status   Specimen Description BLOOD LEFT ARM  Final   Special Requests BOTTLES DRAWN AEROBIC AND ANAEROBIC BCHV  Final   Culture  Setup Time   Final    GRAM POSITIVE COCCI AEROBIC BOTTLE ONLY CRITICAL RESULT CALLED TO, READ BACK BY AND VERIFIED WITH: [name] COOKSON AT  2325 07/26/16.PMH Performed at [facility], [place] [address] [place], [place] [address]    Culture METHICILLIN RESISTANT STAPHYLOCOCCUS AUREUS (A)  Final   Report Status 07/29/2016 FINAL  Final   Organism ID, Bacteria METHICILLIN RESISTANT STAPHYLOCOCCUS AUREUS  Final      Susceptibility   Methicillin resistant staphylococcus aureus - MIC*    CIPROFLOXACIN >=8 RESISTANT Resistant     ERYTHROMYCIN >=8 RESISTANT Resistant     GENTAMICIN <=0.5 SENSITIVE Sensitive     OXACILLIN >=4 RESISTANT Resistant     TETRACYCLINE <=1 SENSITIVE Sensitive     VANCOMYCIN 1 SENSITIVE Sensitive     TRIMETH/SULFA <=10 SENSITIVE Sensitive     CLINDAMYCIN RESISTANT Resistant      RIFAMPIN <=0.5 SENSITIVE Sensitive     Inducible Clindamycin POSITIVE Resistant     * METHICILLIN RESISTANT STAPHYLOCOCCUS AUREUS  Blood Culture ID Panel (Reflexed)     Status: Abnormal   Collection Time: 07/26/16  9:57 AM  Result Value Ref Range Status   Enterococcus species NOT DETECTED NOT DETECTED Final   Listeria monocytogenes NOT DETECTED NOT DETECTED Final   Staphylococcus species DETECTED (A) NOT DETECTED Final    Comment: CRITICAL RESULT CALLED TO, READ BACK BY AND VERIFIED WITH: [name] COOKSON AT 2325 07/26/16.PMH    Staphylococcus aureus DETECTED (A) NOT DETECTED Final    Comment: Methicillin (oxacillin)-resistant Staphylococcus aureus (MRSA). MRSA is predictably resistant to beta-lactam antibiotics (except ceftaroline). Preferred therapy is vancomycin unless clinically contraindicated. Patient requires contact precautions if  hospitalized. CRITICAL RESULT CALLED TO, READ BACK BY AND VERIFIED WITH: [name] COOKSON AT 2325 07/26/16.PMH    Methicillin resistance DETECTED (A) NOT DETECTED Final    Comment: CRITICAL RESULT CALLED TO, READ BACK BY AND VERIFIED WITH: [name] COOKSON AT 2325 07/26/16.PMH    Streptococcus species NOT DETECTED NOT DETECTED Final   Streptococcus agalactiae NOT DETECTED NOT DETECTED Final   Streptococcus pneumoniae NOT DETECTED NOT DETECTED Final   Streptococcus pyogenes NOT DETECTED NOT DETECTED Final   Acinetobacter baumannii NOT DETECTED NOT DETECTED Final   Enterobacteriaceae species NOT DETECTED NOT DETECTED Final   Enterobacter cloacae complex NOT DETECTED NOT DETECTED Final   Escherichia coli NOT DETECTED NOT DETECTED Final   Klebsiella oxytoca NOT DETECTED NOT DETECTED Final   Klebsiella pneumoniae NOT DETECTED NOT DETECTED Final   Proteus species NOT DETECTED NOT DETECTED Final   Serratia marcescens NOT DETECTED NOT DETECTED Final   Haemophilus influenzae NOT DETECTED NOT DETECTED Final   Neisseria meningitidis NOT DETECTED NOT DETECTED Final    Pseudomonas aeruginosa NOT DETECTED NOT DETECTED Final   Candida albicans NOT DETECTED NOT DETECTED Final   Candida glabrata NOT DETECTED NOT DETECTED Final   Candida krusei NOT DETECTED NOT DETECTED Final   Candida parapsilosis NOT DETECTED NOT DETECTED Final   Candida tropicalis NOT DETECTED NOT DETECTED Final  MRSA PCR Screening     Status: None   Collection Time: 07/27/16  1:10 AM  Result Value Ref Range Status   MRSA by PCR NEGATIVE NEGATIVE Final    Comment:        The GeneXpert MRSA Assay (FDA approved for NASAL specimens only), is one component of a comprehensive MRSA colonization surveillance program. It is not intended to diagnose MRSA infection nor to guide or monitor treatment for MRSA infections.   Cath Tip Culture     Status: Abnormal   Collection Time: 07/27/16 12:07 PM  Result Value Ref Range Status   Specimen Description CATH TIP  Final  Special Requests NONE  Final   Culture (A)  Final    >=100,000 COLONIES/mL METHICILLIN RESISTANT STAPHYLOCOCCUS AUREUS   Report Status 07/30/2016 FINAL  Final   Organism ID, Bacteria METHICILLIN RESISTANT STAPHYLOCOCCUS AUREUS (A)  Final      Susceptibility   Methicillin resistant staphylococcus aureus - MIC*    CIPROFLOXACIN >=8 RESISTANT Resistant     ERYTHROMYCIN >=8 RESISTANT Resistant     GENTAMICIN <=0.5 SENSITIVE Sensitive     OXACILLIN >=4 RESISTANT Resistant     TETRACYCLINE <=1 SENSITIVE Sensitive     VANCOMYCIN 1 SENSITIVE Sensitive     TRIMETH/SULFA <=10 SENSITIVE Sensitive     CLINDAMYCIN RESISTANT Resistant     RIFAMPIN <=0.5 SENSITIVE Sensitive     Inducible Clindamycin POSITIVE Resistant     * >=100,000 COLONIES/mL METHICILLIN RESISTANT STAPHYLOCOCCUS AUREUS  CULTURE, BLOOD (ROUTINE X 2) w Reflex to ID Panel     Status: None   Collection Time: 07/28/16  1:59 PM  Result Value Ref Range Status   Specimen Description BLOOD  LEFT HAND  Final   Special Requests BOTTLES DRAWN AEROBIC AND ANAEROBIC  BCAV   Final   Culture NO GROWTH 5 DAYS  Final   Report Status 08/02/2016 FINAL  Final  CULTURE, BLOOD (ROUTINE X 2) w Reflex to ID Panel     Status: None   Collection Time: 07/28/16  2:10 PM  Result Value Ref Range Status   Specimen Description BLOOD  LEFT WRIST  Final   Special Requests BOTTLES DRAWN AEROBIC AND ANAEROBIC  BCAV  Final   Culture NO GROWTH 5 DAYS  Final   Report Status 08/02/2016 FINAL  Final    Coagulation Studies:  Recent Labs  08/21/16 0049  LABPROT 28.9*  INR 2.66    Urinalysis: No results for input(s): COLORURINE, LABSPEC, PHURINE, GLUCOSEU, HGBUR, BILIRUBINUR, KETONESUR, PROTEINUR, UROBILINOGEN, NITRITE, LEUKOCYTESUR in the last 72 hours.  Invalid input(s): APPERANCEUR    Imaging: Dg Chest Port 1 View  Result Date: 08/21/2016 CLINICAL DATA:  Acute onset of shortness of breath and generalized chest pain. Initial encounter. EXAM: PORTABLE CHEST 1 VIEW COMPARISON:  Chest radiograph and CTA of the chest performed 08/03/2016 FINDINGS: The lungs are well-aerated. A small right pleural effusion is noted. Increased interstitial markings raise concern for mild interstitial edema. No pneumothorax is seen. The cardiomediastinal silhouette is mildly enlarged. A left-sided dual-lumen catheter is noted ending about the right cavoatrial junction. No acute osseous abnormalities are seen. IMPRESSION: Small right pleural effusion. Increased interstitial markings raise concern for mild interstitial edema. Mild cardiomegaly noted. Electronically Signed   By: Garald Balding M.D.   On: 08/21/2016 01:38     Medications:   . heparin 1,100 Units/hr (08/22/16 0555)   . aspirin  325 mg Oral BH-q7a  . atorvastatin  10 mg Oral QHS  . calcium acetate  667 mg Oral TID WC  . [START ON 08/26/2016] Darbepoetin Alfa  40 mcg Intravenous Q Mon-HD  . docusate sodium  100 mg Oral BID  . doxercalciferol  2.5 mcg Intravenous Q M,W,F-HD  . feeding supplement (NEPRO CARB STEADY)  237 mL Oral Daily  .  ferric gluconate (FERRLECIT/NULECIT) IV  125 mg Intravenous Q M,W,F-HD  . fluticasone  1 spray Each Nare Daily  . isosorbide mononitrate  30 mg Oral Daily  . mometasone-formoterol  2 puff Inhalation BID  . multivitamin  1 tablet Oral QHS  . multivitamin with minerals  1 tablet Oral Daily  . pantoprazole  40  mg Oral Daily  . polyethylene glycol  17 g Oral BH-q7a  . pregabalin  75 mg Oral BID  . sevelamer carbonate  800 mg Oral Q breakfast  . sodium chloride flush  3 mL Intravenous Q12H  . tiotropium  18 mcg Inhalation Daily  . [START ON 08/23/2016] vancomycin  1,000 mg Intravenous Q M,W,F-HD  . warfarin  5 mg Oral q1800  . Warfarin - Pharmacist Dosing Inpatient   Does not apply q1800     Assessment/ Plan:  [name] is a 61 y.o. black male with ESRD on hemodialysis, hypertension, hyperlipidemia, anemia, COPD, peripheral vascular disease, left BKA, right toe amputations, Pulm Embolism 07/2016 requiring Warfarin  MWF [facility].   1.  End stage renal disease -  With hyperkalemia - urgent treatment given on Wednesday HD MWF Next Treatment Friday  If patient is discharged, he will follow his outpatient schedule   2.  Anemia of chronic kidney disease: Hemoglobin found to be 10.3 - continue EPO as outpatient  3.  Shortness of breath - UF with HD as tolerated removed 2000 cc with improvement in symptoms  4. Secondary Hyperparathyroidism: - Phosphorus currently 6.0.  Continue calcium acetate as well as Renvela with food        LOS: 1 Meriel Kelliher 3/29/20181:58 PM

## 2016-08-22 NOTE — Progress Notes (Signed)
ANTICOAGULATION CONSULT NOTE - Initial Consult  Pharmacy Consult for warfarin Indication: VTE treatment  Allergies  Allergen Reactions  . Dust Mite Extract Other (See Comments)    Reaction: unknown    Patient Measurements: Height: 6' 2.5" (189.2 cm) Weight: 177 lb 6.4 oz (80.5 kg) IBW/kg (Calculated) : 83.35 Heparin Dosing Weight:   Vital Signs: Temp: 98.5 F (36.9 C) (03/29 0422) Temp Source: Oral (03/29 0422) BP: 118/90 (03/29 0836) Pulse Rate: 98 (03/29 0836)  Labs:  Recent Labs  08/21/16 0049  08/21/16 0653 08/21/16 1245 08/21/16 1848 08/21/16 2046 08/22/16 0415  HGB 10.3*  --   --   --   --   --   --   HCT 30.8*  --   --   --   --   --   --   PLT 174  --   --   --   --   --   --   APTT  --   --  87*  --   --   --   --   LABPROT 28.9*  --   --   --   --   --   --   INR 2.66  --   --   --   --   --   --   HEPARINUNFRC  --   < > <0.10* 0.44  --  0.34 0.26*  CREATININE 11.19*  --   --   --   --   --   --   TROPONINI 1.71*  --  1.69* 1.80* 1.66*  --   --   < > = values in this interval not displayed.  Estimated Creatinine Clearance: 7.8 mL/min (A) (by C-G formula based on SCr of 11.19 mg/dL (H)).   Medical History: Past Medical History:  Diagnosis Date  . Anemia   . Asthma   . CHF (congestive heart failure) ([place])   . Chronic systolic heart failure ([place])   . Complication of anesthesia    hypotension  . COPD (chronic obstructive pulmonary disease) ([place])   . Coronary artery disease   . Dialysis patient ([place])    Mon, Wed, Fri  . End stage renal disease ([place])   . GERD (gastroesophageal reflux disease)   . Headache   . History of kidney stones   . HLD (hyperlipidemia)   . HTN (hypertension)   . Hyperparathyroidism   . Myocardial infarction   . Peripheral vascular disease ([place])   . Shortness of breath dyspnea   . Sleep apnea   . Tobacco dependence     Medications:  Infusions:  . heparin 1,100 Units/hr (08/22/16 0555)    Assessment: 26 yom with  ESRD on HD, CP likely demand ischemia on VKA as outpatient, recent history of PE. Pharmacy consulted to resume VKA for discharge.  Goal of Therapy:  INR 2-3 Monitor platelets by anticoagulation protocol: Yes   Plan:  Warfarin 5 mg po daily. Pharmacy will continue to follow and adjust as needed to maintain INR 2 to 3.   Laural Benes, Pharm.D., BCPS Clinical Pharmacist 08/22/2016,10:54 AM

## 2016-08-22 NOTE — Progress Notes (Signed)
Dr. Bridgett Larsson aware of K 5.3 Instructed to proceed with discharge.

## 2016-08-22 NOTE — Discharge Instructions (Signed)
Renal and heart healthy diet

## 2016-08-22 NOTE — Progress Notes (Signed)
Patient refuses bed alarm while waiting for transportation. Agrees to call for assistance.

## 2016-08-22 NOTE — Progress Notes (Signed)
Dr. Bridgett Larsson has spoken with Dr. Clayborn Bigness - instructed to proceed with discharge.

## 2016-08-22 NOTE — Progress Notes (Signed)
Patient stated "I'm tired of waiting for EMS. My sister is getting out of the funeral and could come get me faster." Discussed safety as patient has amputation. On room air. Has been doing well with one assist transfer. Says he has a prosthetic leg just not with him. Confirmed with Randall Hiss, SW that this was patient's wish. Called EMS to cancel transport - patient aware that list is long and if patient changes his mind back to EMS he will be put back at end of list. Sister should be here shortly to take patient home.

## 2016-08-22 NOTE — Clinical Social Work Note (Addendum)
Patient to be d/c'ed today to [facility].  Patient and family agreeable to plans will transport via sister's car RN to call report to [phone].  Evette Cristal, MSW, [place]

## 2016-08-22 NOTE — Discharge Summary (Addendum)
[facility] NAME: [name]    MR#:  [id number]  DATE OF BIRTH:  28-Apr-1956  DATE OF ADMISSION:  08/21/2016   ADMITTING PHYSICIAN: Harrie Foreman, MD  DATE OF DISCHARGE: 08/22/2016 PRIMARY CARE PHYSICIAN: [facility]   ADMISSION DIAGNOSIS:  NSTEMI (non-ST elevated myocardial infarction) ([place]) [I21.4] DISCHARGE DIAGNOSIS:  Active Problems:   NSTEMI (non-ST elevated myocardial infarction) ([place]) Chest pain with Elevated troponin probably demand ischemia as well as renal failure SECONDARY DIAGNOSIS:   Past Medical History:  Diagnosis Date  . Anemia   . Asthma   . CHF (congestive heart failure) ([place])   . Chronic systolic heart failure ([place])   . Complication of anesthesia    hypotension  . COPD (chronic obstructive pulmonary disease) ([place])   . Coronary artery disease   . Dialysis patient ([place])    Mon, Wed, Fri  . End stage renal disease ([place])   . GERD (gastroesophageal reflux disease)   . Headache   . History of kidney stones   . HLD (hyperlipidemia)   . HTN (hypertension)   . Hyperparathyroidism   . Myocardial infarction   . Peripheral vascular disease ([place])   . Shortness of breath dyspnea   . Sleep apnea   . Tobacco dependence    HOSPITAL COURSE:   This is a 61 year old male admitted for chest pain. 1. Chest pain with Elevated troponin probably demand ischemia as well as renal failure EKG changes and elevated troponin.  He has been treated wtih heparin drip, resume coumadin, add imdur and f/u as outpatient per Dr. Clayborn Bigness.  2. CHF: Chronic and systolic, stable, on hemodialysis.  3. End stage renal disease: on hemodialysis. Hyperkalemia, got HD yesterday. 4. Hypertension: Uncontrolled as expected with end-stage renal disease on chronic dialysis. Labetalol as needed to control pressure (the patient has a history of hypotension and is also on midodrine). Discontinue midodrine per Dr. Candiss Norse. 5.  Recent MRSA septicemia continue vancomycin as previously 6. Recent history of PE, on Coumadin at home 7. GI prophylaxis: Pantoprazole per home regimen Discussed with Dr. Clayborn Bigness.and Dr. Candiss Norse. Palliative to follow. DISCHARGE CONDITIONS:  Stable, discharge to SNF today. CONSULTS OBTAINED:  Treatment Team:  Murlean Iba, MD Yolonda Kida, MD DRUG ALLERGIES:   Allergies  Allergen Reactions  . Dust Mite Extract Other (See Comments)    Reaction: unknown   DISCHARGE MEDICATIONS:   Allergies as of 08/22/2016      Reactions   Dust Mite Extract Other (See Comments)   Reaction: unknown      Medication List    STOP taking these medications   midodrine 5 MG tablet Commonly known as:  PROAMATINE     TAKE these medications   acetaminophen 500 MG tablet Commonly known as:  TYLENOL Take 1,000 mg by mouth every 6 (six) hours as needed for mild pain.   albuterol 108 (90 Base) MCG/ACT inhaler Commonly known as:  PROVENTIL HFA;VENTOLIN HFA Inhale 2 puffs into the lungs every 6 (six) hours as needed for wheezing or shortness of breath.   aspirin 325 MG tablet Take 325 mg by mouth every morning.   atorvastatin 10 MG tablet Commonly known as:  LIPITOR Take 10 mg by mouth at bedtime.   budesonide-formoterol 160-4.5 MCG/ACT inhaler Commonly known as:  SYMBICORT Inhale 2 puffs into the lungs 2 (two) times daily. Reported on 08/14/2015   calcium acetate 667 MG capsule Commonly known as:  PHOSLO Take 667  mg by mouth 3 (three) times daily with meals.   Darbepoetin Alfa 40 MCG/0.4ML Sosy injection Commonly known as:  ARANESP Inject 0.4 mLs (40 mcg total) into the vein every Monday with hemodialysis.   docusate sodium 100 MG capsule Commonly known as:  COLACE Take 100 mg by mouth 2 (two) times daily as needed for mild constipation. Reported on 08/14/2015   doxercalciferol 4 MCG/2ML injection Commonly known as:  HECTOROL Inject 1.25 mLs (2.5 mcg total) into the vein every  Monday, Wednesday, and Friday with hemodialysis.   feeding supplement (NEPRO CARB STEADY) Liqd Take 237 mLs by mouth daily.   ferric gluconate 125 mg in sodium chloride 0.9 % 100 mL Inject 125 mg into the vein every Monday, Wednesday, and Friday with hemodialysis.   fluticasone 50 MCG/ACT nasal spray Commonly known as:  FLONASE Place 1 spray into both nostrils daily.   ipratropium-albuterol 0.5-2.5 (3) MG/3ML Soln Commonly known as:  DUONEB Take 3 mLs by nebulization every 4 (four) hours as needed (SOB).   isosorbide mononitrate 30 MG 24 hr tablet Commonly known as:  IMDUR Take 1 tablet (30 mg total) by mouth daily.   multivitamin Tabs tablet Take 1 tablet by mouth at bedtime.   multivitamin with minerals tablet Take 1 tablet by mouth daily.   nitroGLYCERIN 0.4 MG SL tablet Commonly known as:  NITROSTAT Place 0.4 mg under the tongue every 5 (five) minutes as needed for chest pain.   oxyCODONE 5 MG immediate release tablet Commonly known as:  Oxy IR/ROXICODONE Take 5 mg by mouth every 8 (eight) hours as needed for severe pain.   oxyCODONE-acetaminophen 7.5-325 MG tablet Commonly known as:  PERCOCET Take 1 tablet by mouth every 4 (four) hours as needed for severe pain.   pantoprazole 20 MG tablet Commonly known as:  PROTONIX Take 20 mg by mouth daily.   polyethylene glycol packet Commonly known as:  MIRALAX / GLYCOLAX Take 17 g by mouth every morning. Hold for loose stools.   pregabalin 75 MG capsule Commonly known as:  LYRICA Take 75 mg by mouth 2 (two) times daily.   sevelamer carbonate 800 MG tablet Commonly known as:  RENVELA Take 800 mg by mouth daily.   simvastatin 20 MG tablet Commonly known as:  ZOCOR Take 20 mg by mouth daily.   tiotropium 18 MCG inhalation capsule Commonly known as:  SPIRIVA Place 18 mcg into inhaler and inhale daily.   Vancomycin 750-5 MG/150ML-% Soln Commonly known as:  VANCOCIN With each dialysis, Tuesday, Thursday and  Saturday. Last dose April 3rd What changed:  how much to take  how to take this  when to take this  additional instructions   warfarin 5 MG tablet Commonly known as:  COUMADIN Take 1 tablet (5 mg total) by mouth daily at 6 PM.        DISCHARGE INSTRUCTIONS:  See AVS.  If you experience worsening of your admission symptoms, develop shortness of breath, life threatening emergency, suicidal or homicidal thoughts you must seek medical attention immediately by calling 911 or calling your MD immediately  if symptoms less severe.  You Must read complete instructions/literature along with all the possible adverse reactions/side effects for all the Medicines you take and that have been prescribed to you. Take any new Medicines after you have completely understood and accpet all the possible adverse reactions/side effects.   Please note  You were cared for by a hospitalist during your hospital stay. If you have any questions about  your discharge medications or the care you received while you were in the hospital after you are discharged, you can call the unit and asked to speak with the hospitalist on call if the hospitalist that took care of you is not available. Once you are discharged, your primary care physician will handle any further medical issues. Please note that NO REFILLS for any discharge medications will be authorized once you are discharged, as it is imperative that you return to your primary care physician (or establish a relationship with a primary care physician if you do not have one) for your aftercare needs so that they can reassess your need for medications and monitor your lab values.    On the day of Discharge:  VITAL SIGNS:  Blood pressure 118/90, pulse 98, temperature 98.5 F (36.9 C), temperature source Oral, resp. rate 19, height 6' 2.5" (1.892 m), weight 177 lb 6.4 oz (80.5 kg), SpO2 95 %. PHYSICAL EXAMINATION:  GENERAL:  61 y.o.-year-old patient lying in the  bed with no acute distress.  EYES: Pupils equal, round, reactive to light and accommodation. No scleral icterus. Extraocular muscles intact.  HEENT: Head atraumatic, normocephalic. Oropharynx and nasopharynx clear.  NECK:  Supple, no jugular venous distention. No thyroid enlargement, no tenderness.  LUNGS: Normal breath sounds bilaterally, no wheezing, rales,rhonchi or crepitation. No use of accessory muscles of respiration.  CARDIOVASCULAR: S1, S2 normal. No murmurs, rubs, or gallops.  ABDOMEN: Soft, non-tender, non-distended. Bowel sounds present. No organomegaly or mass.  EXTREMITIES: No pedal edema, cyanosis, or clubbing. Left BKA. NEUROLOGIC: Cranial nerves II through XII are intact. Muscle strength 5/5 in all extremities. Sensation intact. Gait not checked.  PSYCHIATRIC: The patient is alert and oriented x 3.  SKIN: No obvious rash, lesion, or ulcer.  DATA REVIEW:   CBC  Recent Labs Lab 08/21/16 0049  WBC 7.0  HGB 10.3*  HCT 30.8*  PLT 174    Chemistries   Recent Labs Lab 08/21/16 0049  NA 135  K 6.2*  CL 103  CO2 21*  GLUCOSE 101*  BUN 67*  CREATININE 11.19*  CALCIUM 9.3     Microbiology Results  Results for orders placed or performed during the hospital encounter of 07/26/16  Blood Culture (routine x 2)     Status: Abnormal   Collection Time: 07/26/16  8:08 AM  Result Value Ref Range Status   Specimen Description BLOOD LEFT ANTECUBITAL  Final   Special Requests BOTTLES DRAWN AEROBIC AND ANAEROBIC BCAV  Final   Culture  Setup Time   Final    GRAM POSITIVE COCCI ANAEROBIC BOTTLE ONLY CRITICAL VALUE NOTED.  VALUE IS CONSISTENT WITH PREVIOUSLY REPORTED AND CALLED VALUE.    Culture (A)  Final    STAPHYLOCOCCUS AUREUS SUSCEPTIBILITIES PERFORMED ON PREVIOUS CULTURE WITHIN THE LAST 5 DAYS. Performed at [facility], [place] [address], [place], [place] [address]    Report Status 07/29/2016 FINAL  Final  Blood Culture (routine x 2)     Status: Abnormal    Collection Time: 07/26/16  9:57 AM  Result Value Ref Range Status   Specimen Description BLOOD LEFT ARM  Final   Special Requests BOTTLES DRAWN AEROBIC AND ANAEROBIC BCHV  Final   Culture  Setup Time   Final    GRAM POSITIVE COCCI AEROBIC BOTTLE ONLY CRITICAL RESULT CALLED TO, READ BACK BY AND VERIFIED WITH: [name] COOKSON AT 2325 07/26/16.PMH Performed at [facility], [place] [address], [place], [place] [address]  Culture METHICILLIN RESISTANT STAPHYLOCOCCUS AUREUS (A)  Final   Report Status 07/29/2016 FINAL  Final   Organism ID, Bacteria METHICILLIN RESISTANT STAPHYLOCOCCUS AUREUS  Final      Susceptibility   Methicillin resistant staphylococcus aureus - MIC*    CIPROFLOXACIN >=8 RESISTANT Resistant     ERYTHROMYCIN >=8 RESISTANT Resistant     GENTAMICIN <=0.5 SENSITIVE Sensitive     OXACILLIN >=4 RESISTANT Resistant     TETRACYCLINE <=1 SENSITIVE Sensitive     VANCOMYCIN 1 SENSITIVE Sensitive     TRIMETH/SULFA <=10 SENSITIVE Sensitive     CLINDAMYCIN RESISTANT Resistant     RIFAMPIN <=0.5 SENSITIVE Sensitive     Inducible Clindamycin POSITIVE Resistant     * METHICILLIN RESISTANT STAPHYLOCOCCUS AUREUS  Blood Culture ID Panel (Reflexed)     Status: Abnormal   Collection Time: 07/26/16  9:57 AM  Result Value Ref Range Status   Enterococcus species NOT DETECTED NOT DETECTED Final   Listeria monocytogenes NOT DETECTED NOT DETECTED Final   Staphylococcus species DETECTED (A) NOT DETECTED Final    Comment: CRITICAL RESULT CALLED TO, READ BACK BY AND VERIFIED WITH: [name] COOKSON AT 2325 07/26/16.PMH    Staphylococcus aureus DETECTED (A) NOT DETECTED Final    Comment: Methicillin (oxacillin)-resistant Staphylococcus aureus (MRSA). MRSA is predictably resistant to beta-lactam antibiotics (except ceftaroline). Preferred therapy is vancomycin unless clinically contraindicated. Patient requires contact precautions if  hospitalized. CRITICAL RESULT CALLED TO, READ BACK BY AND VERIFIED  WITH: [name] COOKSON AT 2325 07/26/16.PMH    Methicillin resistance DETECTED (A) NOT DETECTED Final    Comment: CRITICAL RESULT CALLED TO, READ BACK BY AND VERIFIED WITH: [name] COOKSON AT 2325 07/26/16.PMH    Streptococcus species NOT DETECTED NOT DETECTED Final   Streptococcus agalactiae NOT DETECTED NOT DETECTED Final   Streptococcus pneumoniae NOT DETECTED NOT DETECTED Final   Streptococcus pyogenes NOT DETECTED NOT DETECTED Final   Acinetobacter baumannii NOT DETECTED NOT DETECTED Final   Enterobacteriaceae species NOT DETECTED NOT DETECTED Final   Enterobacter cloacae complex NOT DETECTED NOT DETECTED Final   Escherichia coli NOT DETECTED NOT DETECTED Final   Klebsiella oxytoca NOT DETECTED NOT DETECTED Final   Klebsiella pneumoniae NOT DETECTED NOT DETECTED Final   Proteus species NOT DETECTED NOT DETECTED Final   Serratia marcescens NOT DETECTED NOT DETECTED Final   Haemophilus influenzae NOT DETECTED NOT DETECTED Final   Neisseria meningitidis NOT DETECTED NOT DETECTED Final   Pseudomonas aeruginosa NOT DETECTED NOT DETECTED Final   Candida albicans NOT DETECTED NOT DETECTED Final   Candida glabrata NOT DETECTED NOT DETECTED Final   Candida krusei NOT DETECTED NOT DETECTED Final   Candida parapsilosis NOT DETECTED NOT DETECTED Final   Candida tropicalis NOT DETECTED NOT DETECTED Final  MRSA PCR Screening     Status: None   Collection Time: 07/27/16  1:10 AM  Result Value Ref Range Status   MRSA by PCR NEGATIVE NEGATIVE Final    Comment:        The GeneXpert MRSA Assay (FDA approved for NASAL specimens only), is one component of a comprehensive MRSA colonization surveillance program. It is not intended to diagnose MRSA infection nor to guide or monitor treatment for MRSA infections.   Cath Tip Culture     Status: Abnormal   Collection Time: 07/27/16 12:07 PM  Result Value Ref Range Status   Specimen Description CATH TIP  Final   Special Requests NONE  Final   Culture  (A)  Final    >=100,000 COLONIES/mL  METHICILLIN RESISTANT STAPHYLOCOCCUS AUREUS   Report Status 07/30/2016 FINAL  Final   Organism ID, Bacteria METHICILLIN RESISTANT STAPHYLOCOCCUS AUREUS (A)  Final      Susceptibility   Methicillin resistant staphylococcus aureus - MIC*    CIPROFLOXACIN >=8 RESISTANT Resistant     ERYTHROMYCIN >=8 RESISTANT Resistant     GENTAMICIN <=0.5 SENSITIVE Sensitive     OXACILLIN >=4 RESISTANT Resistant     TETRACYCLINE <=1 SENSITIVE Sensitive     VANCOMYCIN 1 SENSITIVE Sensitive     TRIMETH/SULFA <=10 SENSITIVE Sensitive     CLINDAMYCIN RESISTANT Resistant     RIFAMPIN <=0.5 SENSITIVE Sensitive     Inducible Clindamycin POSITIVE Resistant     * >=100,000 COLONIES/mL METHICILLIN RESISTANT STAPHYLOCOCCUS AUREUS  CULTURE, BLOOD (ROUTINE X 2) w Reflex to ID Panel     Status: None   Collection Time: 07/28/16  1:59 PM  Result Value Ref Range Status   Specimen Description BLOOD  LEFT HAND  Final   Special Requests BOTTLES DRAWN AEROBIC AND ANAEROBIC  BCAV  Final   Culture NO GROWTH 5 DAYS  Final   Report Status 08/02/2016 FINAL  Final  CULTURE, BLOOD (ROUTINE X 2) w Reflex to ID Panel     Status: None   Collection Time: 07/28/16  2:10 PM  Result Value Ref Range Status   Specimen Description BLOOD  LEFT WRIST  Final   Special Requests BOTTLES DRAWN AEROBIC AND ANAEROBIC  BCAV  Final   Culture NO GROWTH 5 DAYS  Final   Report Status 08/02/2016 FINAL  Final    RADIOLOGY:  No results found.   Management plans discussed with the patient, family and they are in agreement.  CODE STATUS: DNR   TOTAL TIME TAKING CARE OF THIS PATIENT: 35 minutes.    Demetrios Loll M.D on 08/22/2016 at 10:42 AM  Between 7am to 6pm - Pager - [phone]  After 6pm go to [email or web address] - Proofreader  [facility]  Office  [phone]  CC: Primary care physician; [facility]   Note: This dictation was prepared with Dragon dictation  along with smaller phrase technology. Any transcriptional errors that result from this process are unintentional.

## 2016-09-03 ENCOUNTER — Ambulatory Visit (INDEPENDENT_AMBULATORY_CARE_PROVIDER_SITE_OTHER): Payer: Medicare Other | Admitting: Vascular Surgery

## 2016-09-03 ENCOUNTER — Encounter (INDEPENDENT_AMBULATORY_CARE_PROVIDER_SITE_OTHER): Payer: Self-pay | Admitting: Vascular Surgery

## 2016-09-03 ENCOUNTER — Encounter (INDEPENDENT_AMBULATORY_CARE_PROVIDER_SITE_OTHER): Payer: Self-pay

## 2016-09-03 ENCOUNTER — Ambulatory Visit (INDEPENDENT_AMBULATORY_CARE_PROVIDER_SITE_OTHER): Payer: Medicare Other

## 2016-09-03 VITALS — BP 117/80 | HR 102 | Resp 17 | Wt 182.0 lb

## 2016-09-03 DIAGNOSIS — I739 Peripheral vascular disease, unspecified: Secondary | ICD-10-CM

## 2016-09-03 DIAGNOSIS — N186 End stage renal disease: Secondary | ICD-10-CM

## 2016-09-03 DIAGNOSIS — E875 Hyperkalemia: Secondary | ICD-10-CM

## 2016-09-03 DIAGNOSIS — I10 Essential (primary) hypertension: Secondary | ICD-10-CM

## 2016-09-03 DIAGNOSIS — E785 Hyperlipidemia, unspecified: Secondary | ICD-10-CM

## 2016-09-03 NOTE — Assessment & Plan Note (Signed)
Needs a permanent access. We will plan to go ahead and reschedule his left arm access procedure that has been scheduled multiple times in the past. Risks and benefits of placement discussed. He is agreeable to proceed.

## 2016-09-03 NOTE — Patient Instructions (Signed)
Peripheral Vascular Disease Peripheral vascular disease (PVD) is a disease of the blood vessels that are not part of your heart and brain. A simple term for PVD is poor circulation. In most cases, PVD narrows the blood vessels that carry blood from your heart to the rest of your body. This can result in a decreased supply of blood to your arms, legs, and internal organs, like your stomach or kidneys. However, it most often affects a person's lower legs and feet. There are two types of PVD.  Organic PVD. This is the more common type. It is caused by damage to the structure of blood vessels.  Functional PVD. This is caused by conditions that make blood vessels contract and tighten (spasm).  Without treatment, PVD tends to get worse over time. PVD can also lead to acute ischemic limb. This is when an arm or limb suddenly has trouble getting enough blood. This is a medical emergency. What are the causes? Each type of PVD has many different causes. The most common cause of PVD is buildup of a fatty material (plaque) inside of your arteries (atherosclerosis). Small amounts of plaque can break off from the walls of the blood vessels and become lodged in a smaller artery. This blocks blood flow and can cause acute ischemic limb. Other common causes of PVD include:  Blood clots that form inside of blood vessels.  Injuries to blood vessels.  Diseases that cause inflammation of blood vessels or cause blood vessel spasms.  Health behaviors and health history that increase your risk of developing PVD.  What increases the risk? You may have a greater risk of PVD if you:  Have a family history of PVD.  Have certain medical conditions, including: ? High cholesterol. ? Diabetes. ? High blood pressure (hypertension). ? Coronary heart disease. ? Past problems with blood clots. ? Past injury, such as burns or a broken bone. These may have damaged blood vessels in your limbs. ? Buerger disease. This is  caused by inflamed blood vessels in your hands and feet. ? Some forms of arthritis. ? Rare birth defects that affect the arteries in your legs.  Use tobacco.  Do not get enough exercise.  Are obese.  Are age 50 or older.  What are the signs or symptoms? PVD may cause many different symptoms. Your symptoms depend on what part of your body is not getting enough blood. Some common signs and symptoms include:  Cramps in your lower legs. This may be a symptom of poor leg circulation (claudication).  Pain and weakness in your legs while you are physically active that goes away when you rest (intermittent claudication).  Leg pain when at rest.  Leg numbness, tingling, or weakness.  Coldness in a leg or foot, especially when compared with the other leg.  Skin or hair changes. These can include: ? Hair loss. ? Shiny skin. ? Pale or bluish skin. ? Thick toenails.  Inability to get or maintain an erection (erectile dysfunction).  People with PVD are more prone to developing ulcers and sores on their toes, feet, or legs. These may take longer than normal to heal. How is this diagnosed? Your health care provider may diagnose PVD from your signs and symptoms. The health care provider will also do a physical exam. You may have tests to find out what is causing your PVD and determine its severity. Tests may include:  Blood pressure recordings from your arms and legs and measurements of the strength of your pulses (  pulse volume recordings).  Imaging studies using sound waves to take pictures of the blood flow through your blood vessels (Doppler ultrasound).  Injecting a dye into your blood vessels before having imaging studies using: ? X-rays (angiogram or arteriogram). ? Computer-generated X-rays (CT angiogram). ? A powerful electromagnetic field and a computer (magnetic resonance angiogram or MRA).  How is this treated? Treatment for PVD depends on the cause of your condition and the  severity of your symptoms. It also depends on your age. Underlying causes need to be treated and controlled. These include long-lasting (chronic) conditions, such as diabetes, high cholesterol, and high blood pressure. You may need to first try making lifestyle changes and taking medicines. Surgery may be needed if these do not work. Lifestyle changes may include:  Quitting smoking.  Exercising regularly.  Following a low-fat, low-cholesterol diet.  Medicines may include:  Blood thinners to prevent blood clots.  Medicines to improve blood flow.  Medicines to improve your blood cholesterol levels.  Surgical procedures may include:  A procedure that uses an inflated balloon to open a blocked artery and improve blood flow (angioplasty).  A procedure to put in a tube (stent) to keep a blocked artery open (stent implant).  Surgery to reroute blood flow around a blocked artery (peripheral bypass surgery).  Surgery to remove dead tissue from an infected wound on the affected limb.  Amputation. This is surgical removal of the affected limb. This may be necessary in cases of acute ischemic limb that are not improved through medical or surgical treatments.  Follow these instructions at home:  Take medicines only as directed by your health care provider.  Do not use any tobacco products, including cigarettes, chewing tobacco, or electronic cigarettes. If you need help quitting, ask your health care provider.  Lose weight if you are overweight, and maintain a healthy weight as directed by your health care provider.  Eat a diet that is low in fat and cholesterol. If you need help, ask your health care provider.  Exercise regularly. Ask your health care provider to suggest some good activities for you.  Use compression stockings or other mechanical devices as directed by your health care provider.  Take good care of your feet. ? Wear comfortable shoes that fit well. ? Check your feet  often for any cuts or sores. Contact a health care provider if:  You have cramps in your legs while walking.  You have leg pain when you are at rest.  You have coldness in a leg or foot.  Your skin changes.  You have erectile dysfunction.  You have cuts or sores on your feet that are not healing. Get help right away if:  Your arm or leg turns cold and blue.  Your arms or legs become red, warm, swollen, painful, or numb.  You have chest pain or trouble breathing.  You suddenly have weakness in your face, arm, or leg.  You become very confused or lose the ability to speak.  You suddenly have a very bad headache or lose your vision. This information is not intended to replace advice given to you by your health care provider. Make sure you discuss any questions you have with your health care provider. Document Released: 06/20/2004 Document Revised: 10/19/2015 Document Reviewed: 10/21/2013 Elsevier Interactive Patient Education  2017 [employer] Inc[employer]  

## 2016-09-03 NOTE — Assessment & Plan Note (Signed)
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 His right ABI was noncompressible. 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 He has a litany of atherosclerotic risk factors. Appropriate lifestyle modifications were again discussed. We will continue to monitor his vascular disease with noninvasive studies in about 6 months.

## 2016-09-03 NOTE — Assessment & Plan Note (Signed)
lipid control important in reducing the progression of atherosclerotic disease. Continue statin therapy  

## 2016-09-03 NOTE — Assessment & Plan Note (Signed)
blood pressure control important in reducing the progression of atherosclerotic disease. On appropriate oral medications.  

## 2016-09-03 NOTE — Assessment & Plan Note (Signed)
Has caused cancellation of his access surgery on multiple occasions.

## 2016-09-03 NOTE — Progress Notes (Signed)
MRN : [id number]  [name] is a 61 y.o. (November 19, 1955) male who presents with chief complaint of  Chief Complaint  Patient presents with  . Follow-up  .  History of Present Illness: Patient returns today in follow up of Multiple vascular issues. He has been on the schedule several times now for a left arm AV fistula placement. This has been canceled due to hyperkalemia or other issues on multiple occasions now. He is still dialyzing through his PermCath which is in the left jugular location and has no sign of infection. He is here today with noninvasive studies for his peripheral arterial disease. 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 He denies any current ulcerations or infection. He does report some numbness and tingling in his feet that is stable. 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 His right ABI was noncompressible.  Current Outpatient Prescriptions  Medication Sig Dispense Refill  . acetaminophen (TYLENOL) 500 MG tablet Take 1,000 mg by mouth every 6 (six) hours as needed for mild pain.     [name] albuterol (PROVENTIL HFA;VENTOLIN HFA) 108 (90 BASE) MCG/ACT inhaler Inhale 2 puffs into the lungs every 6 (six) hours as needed for wheezing or shortness of breath.     [name] aspirin 325 MG tablet Take 325 mg by mouth every morning.     [name] atorvastatin (LIPITOR) 10 MG tablet Take 10 mg by mouth at bedtime.     . budesonide-formoterol (SYMBICORT) 160-4.5 MCG/ACT inhaler Inhale 2 puffs into the lungs 2 (two) times daily. Reported on 08/14/2015    . calcium acetate (PHOSLO) 667 MG capsule Take 667 mg by mouth 3 (three) times daily with meals.    . Darbepoetin Alfa (ARANESP) 40 MCG/0.4ML SOSY injection Inject 0.4 mLs (40 mcg total) into the vein every Monday with hemodialysis. 8.4 mL   . docusate  sodium (COLACE) 100 MG capsule Take 100 mg by mouth 2 (two) times daily as needed for mild constipation. Reported on 08/14/2015    . doxercalciferol (HECTOROL) 4 MCG/2ML injection Inject 1.25 mLs (2.5 mcg total) into the vein every Monday, Wednesday, and Friday with hemodialysis. 2 mL   . ferric gluconate 125 mg in sodium chloride 0.9 % 100 mL Inject 125 mg into the vein every Monday, Wednesday, and Friday with hemodialysis.    . fluticasone (FLONASE) 50 MCG/ACT nasal spray Place 1 spray into both nostrils daily.    [name] ipratropium-albuterol (DUONEB) 0.5-2.5 (3) MG/3ML SOLN Take 3 mLs by nebulization every 4 (four) hours as needed (SOB).    . isosorbide mononitrate (IMDUR) 30 MG 24 hr tablet Take 1 tablet (30 mg total) by mouth daily. 30 tablet 2  . Multiple Vitamins-Minerals (MULTIVITAMIN WITH MINERALS) tablet Take 1 tablet by mouth daily.    . multivitamin (RENA-VIT) TABS tablet Take 1 tablet by mouth at bedtime.  0  . nitroGLYCERIN (NITROSTAT) 0.4 MG SL tablet Place 0.4 mg under the tongue every 5 (five) minutes as needed for chest pain.    . Nutritional Supplements (FEEDING SUPPLEMENT, NEPRO CARB STEADY,) LIQD Take 237 mLs by mouth daily.    [name] oxyCODONE (OXY IR/ROXICODONE) 5 MG immediate release tablet Take 5 mg by mouth every 8 (eight) hours as needed for severe pain.    [name] oxyCODONE-acetaminophen (PERCOCET) 7.5-325 MG tablet Take 1 tablet by mouth every 4 (four)  hours as needed for severe pain. 12 tablet 0  . pantoprazole (PROTONIX) 20 MG tablet Take 20 mg by mouth daily.    . polyethylene glycol (MIRALAX / GLYCOLAX) packet Take 17 g by mouth every morning. Hold for loose stools.    . pregabalin (LYRICA) 75 MG capsule Take 75 mg by mouth 2 (two) times daily.    . sevelamer carbonate (RENVELA) 800 MG tablet Take 800 mg by mouth daily.     . simvastatin (ZOCOR) 20 MG tablet Take 20 mg by mouth daily.    [name] tiotropium (SPIRIVA) 18 MCG inhalation capsule Place 18 mcg into inhaler and inhale daily.      . Vancomycin (VANCOCIN) 750-5 MG/150ML-% SOLN With each dialysis, Tuesday, Thursday and Saturday. Last dose April 3rd (Patient taking differently: Inject 750 mg into the vein 3 (three) times a week. With each dialysis on Monday, Wednesday, Friday) 4000 mL   . warfarin (COUMADIN) 5 MG tablet Take 1 tablet (5 mg total) by mouth daily at 6 PM. 30 tablet 1   No current facility-administered medications for this visit.     Past Medical History:  Diagnosis Date  . Anemia   . Asthma   . CHF (congestive heart failure) ([place])   . Chronic systolic heart failure ([place])   . Complication of anesthesia    hypotension  . COPD (chronic obstructive pulmonary disease) ([place])   . Coronary artery disease   . Dialysis patient ([place])    Mon, Wed, Fri  . End stage renal disease ([place])   . GERD (gastroesophageal reflux disease)   . Headache   . History of kidney stones   . HLD (hyperlipidemia)   . HTN (hypertension)   . Hyperparathyroidism   . Myocardial infarction   . Peripheral vascular disease ([place])   . Shortness of breath dyspnea   . Sleep apnea   . Tobacco dependence     Past Surgical History:  Procedure Laterality Date  . AMPUTATION Left 05/06/2014   Procedure: AMPUTATION BELOW KNEE;  Surgeon: Elam Dutch, MD;  Location: [place];  Service: Vascular;  Laterality: Left;  . AMPUTATION Right 01/12/2015   Procedure: Foot transmetatarsal amputation;  Surgeon: Algernon Huxley, MD;  Location: [facility];  Service: Vascular;  Laterality: Right;  . APPLICATION OF WOUND VAC Right 03/01/2015   Procedure: Application of Bio-connekt graft and wound vac application to right foot ;  Surgeon: Algernon Huxley, MD;  Location: [facility];  Service: Vascular;  Laterality: Right;  . AV FISTULA PLACEMENT Left   . CARDIAC CATHETERIZATION     stent placement   . CORONARY ANGIOPLASTY    . DIALYSIS/PERMA CATHETER INSERTION N/A 07/31/2016   Procedure: Dialysis/Perma Catheter Insertion;  Surgeon: Algernon Huxley, MD;  Location: [place] [facility];  Service: Cardiovascular;  Laterality: N/A;  . LIGATION OF ARTERIOVENOUS  FISTULA Right 01/31/2016   Procedure: LIGATION OF ARTERIOVENOUS  FISTULA;  Surgeon: Algernon Huxley, MD;  Location: [facility];  Service: Vascular;  Laterality: Right;  . PERIPHERAL VASCULAR CATHETERIZATION Right 12/15/2014   Procedure: Lower Extremity Angiography;  Surgeon: Algernon Huxley, MD;  Location: [place] [facility];  Service: Cardiovascular;  Laterality: Right;  . PERIPHERAL VASCULAR CATHETERIZATION  12/15/2014   Procedure: Lower Extremity Intervention;  Surgeon: Algernon Huxley, MD;  Location: [place] [facility];  Service: Cardiovascular;;  . PERIPHERAL VASCULAR CATHETERIZATION Right 08/14/2015   Procedure: A/V Shuntogram/Fistulagram;  Surgeon: Algernon Huxley, MD;  Location: [place] [facility];  Service: Cardiovascular;  Laterality: Right;  . PERIPHERAL VASCULAR CATHETERIZATION N/A 08/14/2015   Procedure: A/V Shunt Intervention;  Surgeon: Algernon Huxley, MD;  Location: [place] [facility];  Service: Cardiovascular;  Laterality: N/A;  . PERIPHERAL VASCULAR CATHETERIZATION N/A 01/11/2016   Procedure: Dialysis/Perma Catheter Insertion;  Surgeon: Algernon Huxley, MD;  Location: [place] [facility];  Service: Cardiovascular;  Laterality: N/A;  . REVISON OF ARTERIOVENOUS FISTULA Right 02/17/2016   Procedure: removal of AV fistula;  Surgeon: Serafina Mitchell, MD;  Location: [facility];  Service: Vascular;  Laterality: Right;  . REVISON OF ARTERIOVENOUS FISTULA Right 01/31/2016   Procedure: REVISON OF ARTERIOVENOUS FISTULA ( BRACHIOCEPHALIC ) W/ ARTEGRAFT;  Surgeon: Algernon Huxley, MD;  Location: [facility];  Service: Vascular;  Laterality: Right;  . TRANSMETATARSAL AMPUTATION Right 05/04/2015   Procedure: TRANSMETATARSAL AMPUTATION REVISION, great toe amputation;  Surgeon: Algernon Huxley, MD;  Location: [facility];  Service: Vascular;  Laterality: Right;    Social History Social History  Substance Use Topics  . Smoking status: Light  Tobacco Smoker    Packs/day: 0.25    Types: Cigarettes  . Smokeless tobacco: Never Used     Comment: 5  . Alcohol use No  Lives in a facility  Family History Family History  Problem Relation Age of Onset  . Heart failure Other   . Hypertension Other   . Leukemia Other   . Diabetes Other   No bleeding disorders, clotting disorders, autoimmune diseases, or aneurysms  Allergies  Allergen Reactions  . Dust Mite Extract Other (See Comments)    Reaction: unknown     REVIEW OF SYSTEMS (Negative unless checked)  Constitutional: [] Weight loss  [] Fever  [] Chills Cardiac: [] Chest pain   [] Chest pressure   [] Palpitations   [] Shortness of breath when laying flat   [] Shortness of breath at rest   [] Shortness of breath with exertion. Vascular:  [] Pain in legs with walking   [x] Pain in legs at rest   [] Pain in legs when laying flat   [] Claudication   [] Pain in feet when walking  [] Pain in feet at rest  [] Pain in feet when laying flat   [] History of DVT   [] Phlebitis   [] Swelling in legs   [] Varicose veins   [] Non-healing ulcers Pulmonary:   [] Uses home oxygen   [] Productive cough   [] Hemoptysis   [] Wheeze  [] COPD   [] Asthma Neurologic:  [] Dizziness  [] Blackouts   [] Seizures   [] History of stroke   [] History of TIA  [] Aphasia   [] Temporary blindness   [] Dysphagia   [] Weakness or numbness in arms   [] Weakness or numbness in legs Musculoskeletal:  [] Arthritis   [] Joint swelling   [] Joint pain   [] Low back pain Hematologic:  [] Easy bruising  [] Easy bleeding   [] Hypercoagulable state   [] Anemic   Gastrointestinal:  [] Blood in stool   [] Vomiting blood  [] Gastroesophageal reflux/heartburn   [] Abdominal pain Genitourinary:  [x] Chronic kidney disease   [] Difficult urination  [] Frequent urination  [] Burning with urination   [] Hematuria Skin:  [] Rashes   [] Ulcers   [] Wounds Psychological:  [] History of anxiety   []  History of major depression.  Physical Examination  BP 117/80   Pulse (!) 102   Resp 17    Wt 182 lb (82.6 kg)   BMI 23.05 kg/m  Gen:  WD/WN, NAD Head: [place]/AT, No temporalis wasting. Ear/Nose/Throat: Hearing grossly intact, nares w/o erythema or drainage, trachea midline Eyes: Conjunctiva clear. Sclera non-icteric Neck: Supple.  No JVD.  Pulmonary:  Good air movement, no use of accessory muscles.  Cardiac: RRR, normal S1, S2 Vascular: PermCath present in left chest Vessel Right Left  Radial Palpable Palpable                      Popliteal 1+ Palpable Not Palpable  PT Trace Palpable Not Palpable  DP  1+ Palpable Not Palpable   Gastrointestinal: soft, non-tender/non-distended. No guarding/reflex.  Musculoskeletal: M/S 5/5 throughout.  Using Calpella. 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 No ulcers or sores on his right transmetatarsal amputation. Neurologic: Sensation grossly intact in extremities.  Symmetrical.  Speech is fluent.  Psychiatric: Judgment intact, Mood & affect appropriate for pt's clinical situation. Dermatologic: No rashes or ulcers noted.  No cellulitis or open wounds. Lymph : No Cervical, Axillary, or Inguinal lymphadenopathy.      Labs Recent Results (from the past 2160 hour(s))  Surgical pcr screen     Status: None   Collection Time: 07/25/16  1:03 PM  Result Value Ref Range   MRSA, PCR NEGATIVE NEGATIVE   Staphylococcus aureus NEGATIVE NEGATIVE    Comment:        The Xpert SA Assay (FDA approved for NASAL specimens in patients over 73 years of age), is one component of a comprehensive surveillance program.  Test performance has been validated by [facility] for patients greater than or equal to 4 year old. It is not intended to diagnose infection nor to guide or monitor treatment.   APTT     Status: None   Collection Time: 07/25/16  1:06 PM  Result Value Ref Range   aPTT 35 24 - 36 seconds  Basic metabolic panel     Status: Abnormal   Collection Time: 07/25/16  1:06 PM  Result Value Ref Range   Sodium 137 135  - 145 mmol/L   Potassium 5.6 (H) 3.5 - 5.1 mmol/L   Chloride 100 (L) 101 - 111 mmol/L   CO2 23 22 - 32 mmol/L   Glucose, Bld 95 65 - 99 mg/dL   BUN 70 (H) 6 - 20 mg/dL   Creatinine, Ser 10.31 (H) 0.61 - 1.24 mg/dL   Calcium 9.4 8.9 - 10.3 mg/dL   GFR calc non Af Amer 5 (L) >60 mL/min   GFR calc Af Amer 5 (L) >60 mL/min    Comment: (NOTE) The eGFR has been calculated using the CKD EPI equation. This calculation has not been validated in all clinical situations. eGFR's persistently <60 mL/min signify possible Chronic Kidney Disease.    Anion gap 14 5 - 15  CBC WITH DIFFERENTIAL     Status: Abnormal   Collection Time: 07/25/16  1:06 PM  Result Value Ref Range   WBC 12.5 (H) 3.8 - 10.6 K/uL   RBC 4.09 (L) 4.40 - 5.90 MIL/uL   Hemoglobin 12.8 (L) 13.0 - 18.0 g/dL   HCT 38.1 (L) 40.0 - 52.0 %   MCV 93.1 80.0 - 100.0 fL   MCH 31.3 26.0 - 34.0 pg   MCHC 33.6 32.0 - 36.0 g/dL   RDW 17.4 (H) 11.5 - 14.5 %   Platelets 119 (L) 150 - 440 K/uL   Neutrophils Relative % 47 %   Lymphocytes Relative 41 %   Monocytes Relative 7 %   Eosinophils Relative 4 %   Basophils Relative 0 %   Band Neutrophils 0 %   Metamyelocytes Relative 1 %   Myelocytes 0 %  Promyelocytes Absolute 0 %   Blasts 0 %   nRBC 0 0 /100 WBC   Other 0 %   Neutro Abs 6.0 1.4 - 6.5 K/uL   Lymphs Abs 5.1 (H) 1.0 - 3.6 K/uL   Monocytes Absolute 0.9 0.2 - 1.0 K/uL   Eosinophils Absolute 0.5 0 - 0.7 K/uL   Basophils Absolute 0.0 0 - 0.1 K/uL   Smear Review MORPHOLOGY UNREMARKABLE   Protime-INR     Status: None   Collection Time: 07/25/16  1:06 PM  Result Value Ref Range   Prothrombin Time 14.7 11.4 - 15.2 seconds   INR 1.14   Type and screen     Status: None   Collection Time: 07/25/16  1:06 PM  Result Value Ref Range   ABO/RH(D) B POS    Antibody Screen NEG    Sample Expiration 07/28/2016   I-STAT 4, (NA,K, GLUC, HGB,HCT)     Status: Abnormal   Collection Time: 07/25/16  1:59 PM  Result Value Ref Range   Sodium  137 135 - 145 mmol/L   Potassium 5.8 (H) 3.5 - 5.1 mmol/L   Glucose, Bld 91 65 - 99 mg/dL   HCT 38.0 (L) 39.0 - 52.0 %   Hemoglobin 12.9 (L) 13.0 - 17.0 g/dL  Basic metabolic panel     Status: Abnormal   Collection Time: 07/26/16  6:45 AM  Result Value Ref Range   Sodium 135 135 - 145 mmol/L   Potassium 7.0 (HH) 3.5 - 5.1 mmol/L    Comment: CRITICAL RESULT CALLED TO, READ BACK BY AND VERIFIED WITH FELICIA STAROPOLI AT 6962 07/26/16 DAS    Chloride 103 101 - 111 mmol/L   CO2 19 (L) 22 - 32 mmol/L   Glucose, Bld 94 65 - 99 mg/dL   BUN 91 (H) 6 - 20 mg/dL   Creatinine, Ser 12.17 (H) 0.61 - 1.24 mg/dL   Calcium 9.0 8.9 - 10.3 mg/dL   GFR calc non Af Amer 4 (L) >60 mL/min   GFR calc Af Amer 4 (L) >60 mL/min    Comment: (NOTE) The eGFR has been calculated using the CKD EPI equation. This calculation has not been validated in all clinical situations. eGFR's persistently <60 mL/min signify possible Chronic Kidney Disease.    Anion gap 13 5 - 15  CBC     Status: Abnormal   Collection Time: 07/26/16  6:45 AM  Result Value Ref Range   WBC 15.0 (H) 3.8 - 10.6 K/uL   RBC 3.98 (L) 4.40 - 5.90 MIL/uL   Hemoglobin 12.3 (L) 13.0 - 18.0 g/dL   HCT 36.6 (L) 40.0 - 52.0 %   MCV 91.9 80.0 - 100.0 fL   MCH 30.9 26.0 - 34.0 pg   MCHC 33.6 32.0 - 36.0 g/dL   RDW 17.0 (H) 11.5 - 14.5 %   Platelets 117 (L) 150 - 440 K/uL  Troponin I     Status: Abnormal   Collection Time: 07/26/16  6:45 AM  Result Value Ref Range   Troponin I 2.06 (HH) <0.03 ng/mL    Comment: CRITICAL RESULT CALLED TO, READ BACK BY AND VERIFIED WITH FELICIA STAROPOLI AT 9528 07/26/16 DAS   APTT     Status: None   Collection Time: 07/26/16  8:05 AM  Result Value Ref Range   aPTT 35 24 - 36 seconds  Protime-INR     Status: None   Collection Time: 07/26/16  8:05 AM  Result Value Ref Range  Prothrombin Time 14.7 11.4 - 15.2 seconds   INR 1.14   Lactic acid, plasma     Status: None   Collection Time: 07/26/16  8:05 AM  Result  Value Ref Range   Lactic Acid, Venous 1.5 0.5 - 1.9 mmol/L  Influenza panel by PCR (type A & B)     Status: None   Collection Time: 07/26/16  8:05 AM  Result Value Ref Range   Influenza A By PCR NEGATIVE NEGATIVE   Influenza B By PCR NEGATIVE NEGATIVE    Comment: (NOTE) The Xpert Xpress Flu assay is intended as an aid in the diagnosis of  influenza and should not be used as a sole basis for treatment.  This  assay is FDA approved for nasopharyngeal swab specimens only. Nasal  washings and aspirates are unacceptable for Xpert Xpress Flu testing.   Blood Culture (routine x 2)     Status: Abnormal   Collection Time: 07/26/16  8:08 AM  Result Value Ref Range   Specimen Description BLOOD LEFT ANTECUBITAL    Special Requests BOTTLES DRAWN AEROBIC AND ANAEROBIC BCAV    Culture  Setup Time      GRAM POSITIVE COCCI ANAEROBIC BOTTLE ONLY CRITICAL VALUE NOTED.  VALUE IS CONSISTENT WITH PREVIOUSLY REPORTED AND CALLED VALUE.    Culture (A)     STAPHYLOCOCCUS AUREUS SUSCEPTIBILITIES PERFORMED ON PREVIOUS CULTURE WITHIN THE LAST 5 DAYS. Performed at [facility], [place] [address] [place], [place] [address]    Report Status 07/29/2016 FINAL   Troponin I     Status: Abnormal   Collection Time: 07/26/16  9:04 AM  Result Value Ref Range   Troponin I 2.30 (HH) <0.03 ng/mL    Comment: CRITICAL VALUE NOTED. VALUE IS CONSISTENT WITH PREVIOUSLY REPORTED/CALLED VALUE DAS  Lipid panel     Status: Abnormal   Collection Time: 07/26/16  9:04 AM  Result Value Ref Range   Cholesterol 131 0 - 200 mg/dL   Triglycerides 196 (H) <150 mg/dL   HDL 45 >40 mg/dL   Total CHOL/HDL Ratio 2.9 RATIO   VLDL 39 0 - 40 mg/dL   LDL Cholesterol 47 0 - 99 mg/dL    Comment:        Total Cholesterol/HDL:CHD Risk Coronary Heart Disease Risk Table                     Men   Women  1/2 Average Risk   3.4   3.3  Average Risk       5.0   4.4  2 X Average Risk   9.6   7.1  3 X Average Risk  23.4   11.0        Use the  calculated Patient Ratio above and the CHD Risk Table to determine the patient's CHD Risk.        ATP III CLASSIFICATION (LDL):  <100     mg/dL   Optimal  100-129  mg/dL   Near or Above                    Optimal  130-159  mg/dL   Borderline  160-189  mg/dL   High  >190     mg/dL   Very High   Blood Culture (routine x 2)     Status: Abnormal   Collection Time: 07/26/16  9:57 AM  Result Value Ref Range   Specimen Description BLOOD LEFT ARM    Special Requests  BOTTLES DRAWN AEROBIC AND ANAEROBIC BCHV    Culture  Setup Time      GRAM POSITIVE COCCI AEROBIC BOTTLE ONLY CRITICAL RESULT CALLED TO, READ BACK BY AND VERIFIED WITH: [name] COOKSON AT 2325 07/26/16.PMH Performed at [facility], [place] [address] [place], [place] [address]    Culture METHICILLIN RESISTANT STAPHYLOCOCCUS AUREUS (A)    Report Status 07/29/2016 FINAL    Organism ID, Bacteria METHICILLIN RESISTANT STAPHYLOCOCCUS AUREUS       Susceptibility   Methicillin resistant staphylococcus aureus - MIC*    CIPROFLOXACIN >=8 RESISTANT Resistant     ERYTHROMYCIN >=8 RESISTANT Resistant     GENTAMICIN <=0.5 SENSITIVE Sensitive     OXACILLIN >=4 RESISTANT Resistant     TETRACYCLINE <=1 SENSITIVE Sensitive     VANCOMYCIN 1 SENSITIVE Sensitive     TRIMETH/SULFA <=10 SENSITIVE Sensitive     CLINDAMYCIN RESISTANT Resistant     RIFAMPIN <=0.5 SENSITIVE Sensitive     Inducible Clindamycin POSITIVE Resistant     * METHICILLIN RESISTANT STAPHYLOCOCCUS AUREUS  Blood Culture ID Panel (Reflexed)     Status: Abnormal   Collection Time: 07/26/16  9:57 AM  Result Value Ref Range   Enterococcus species NOT DETECTED NOT DETECTED   Listeria monocytogenes NOT DETECTED NOT DETECTED   Staphylococcus species DETECTED (A) NOT DETECTED    Comment: CRITICAL RESULT CALLED TO, READ BACK BY AND VERIFIED WITH: [name] COOKSON AT 2325 07/26/16.PMH    Staphylococcus aureus DETECTED (A) NOT DETECTED    Comment: Methicillin (oxacillin)-resistant  Staphylococcus aureus (MRSA). MRSA is predictably resistant to beta-lactam antibiotics (except ceftaroline). Preferred therapy is vancomycin unless clinically contraindicated. Patient requires contact precautions if  hospitalized. CRITICAL RESULT CALLED TO, READ BACK BY AND VERIFIED WITH: [name] COOKSON AT 2325 07/26/16.PMH    Methicillin resistance DETECTED (A) NOT DETECTED    Comment: CRITICAL RESULT CALLED TO, READ BACK BY AND VERIFIED WITH: [name] COOKSON AT 2325 07/26/16.PMH    Streptococcus species NOT DETECTED NOT DETECTED   Streptococcus agalactiae NOT DETECTED NOT DETECTED   Streptococcus pneumoniae NOT DETECTED NOT DETECTED   Streptococcus pyogenes NOT DETECTED NOT DETECTED   Acinetobacter baumannii NOT DETECTED NOT DETECTED   Enterobacteriaceae species NOT DETECTED NOT DETECTED   Enterobacter cloacae complex NOT DETECTED NOT DETECTED   Escherichia coli NOT DETECTED NOT DETECTED   Klebsiella oxytoca NOT DETECTED NOT DETECTED   Klebsiella pneumoniae NOT DETECTED NOT DETECTED   Proteus species NOT DETECTED NOT DETECTED   Serratia marcescens NOT DETECTED NOT DETECTED   Haemophilus influenzae NOT DETECTED NOT DETECTED   Neisseria meningitidis NOT DETECTED NOT DETECTED   Pseudomonas aeruginosa NOT DETECTED NOT DETECTED   Candida albicans NOT DETECTED NOT DETECTED   Candida glabrata NOT DETECTED NOT DETECTED   Candida krusei NOT DETECTED NOT DETECTED   Candida parapsilosis NOT DETECTED NOT DETECTED   Candida tropicalis NOT DETECTED NOT DETECTED  Glucose, capillary     Status: None   Collection Time: 07/26/16 10:40 AM  Result Value Ref Range   Glucose-Capillary 65 65 - 99 mg/dL  Glucose, capillary     Status: None   Collection Time: 07/26/16  3:41 PM  Result Value Ref Range   Glucose-Capillary 78 65 - 99 mg/dL  Troponin I     Status: Abnormal   Collection Time: 07/26/16  5:10 PM  Result Value Ref Range   Troponin I 1.82 (HH) <0.03 ng/mL    Comment: CRITICAL VALUE NOTED. VALUE IS  CONSISTENT WITH PREVIOUSLY REPORTED/CALLED VALUE JLJ  Heparin level (unfractionated)     Status: Abnormal   Collection Time: 07/26/16  5:10 PM  Result Value Ref Range   Heparin Unfractionated 0.25 (L) 0.30 - 0.70 IU/mL    Comment:        IF HEPARIN RESULTS ARE BELOW EXPECTED VALUES, AND PATIENT DOSAGE HAS BEEN CONFIRMED, SUGGEST FOLLOW UP TESTING OF ANTITHROMBIN III LEVELS.   Hepatitis B surface antigen     Status: None   Collection Time: 07/26/16  5:10 PM  Result Value Ref Range   Hepatitis B Surface Ag Negative Negative    Comment: (NOTE) Performed At: [facility] [place], [place] [id number] Lindon Romp MD [id number]   Hepatitis B surface antibody     Status: Abnormal   Collection Time: 07/26/16  5:10 PM  Result Value Ref Range   Hepatitis B-Post <3.1 (L) Immunity>9.9 mIU/mL    Comment: (NOTE)  Status of Immunity                     Anti-HBs Level  ------------------                     -------------- Inconsistent with Immunity                   0.0 - 9.9 Consistent with Immunity                          >9.9 Performed At: [facility] [address] [place], [place] [id number] Lindon Romp MD [id number]   HIV antibody (Routine Testing)     Status: None   Collection Time: 07/26/16  5:10 PM  Result Value Ref Range   HIV Screen 4th Generation wRfx Non Reactive Non Reactive    Comment: (NOTE) Performed At: [facility] [address] [place], [place] [id number] Lindon Romp MD [id number]   Glucose, capillary     Status: None   Collection Time: 07/26/16  5:19 PM  Result Value Ref Range   Glucose-Capillary 90 65 - 99 mg/dL  Troponin I     Status: Abnormal   Collection Time: 07/26/16  8:46 PM  Result Value Ref Range   Troponin I 1.74 (HH) <0.03 ng/mL    Comment: CRITICAL VALUE NOTED. VALUE IS CONSISTENT WITH PREVIOUSLY REPORTED/CALLED VALUE JLJ  Glucose, capillary     Status: Abnormal   Collection Time:  07/26/16  9:22 PM  Result Value Ref Range   Glucose-Capillary 107 (H) 65 - 99 mg/dL   Comment 1 Notify RN   MRSA PCR Screening     Status: None   Collection Time: 07/27/16  1:10 AM  Result Value Ref Range   MRSA by PCR NEGATIVE NEGATIVE    Comment:        The GeneXpert MRSA Assay (FDA approved for NASAL specimens only), is one component of a comprehensive MRSA colonization surveillance program. It is not intended to diagnose MRSA infection nor to guide or monitor treatment for MRSA infections.   Basic metabolic panel     Status: Abnormal   Collection Time: 07/27/16  1:39 AM  Result Value Ref Range   Sodium 136 135 - 145 mmol/L   Potassium 5.1 3.5 - 5.1 mmol/L   Chloride 96 (L) 101 - 111 mmol/L   CO2 28 22 - 32 mmol/L   Glucose, Bld 125 (H) 65 - 99 mg/dL   BUN 58 (H) 6 - 20 mg/dL  Creatinine, Ser 8.70 (H) 0.61 - 1.24 mg/dL   Calcium 8.6 (L) 8.9 - 10.3 mg/dL   GFR calc non Af Amer 6 (L) >60 mL/min   GFR calc Af Amer 7 (L) >60 mL/min    Comment: (NOTE) The eGFR has been calculated using the CKD EPI equation. This calculation has not been validated in all clinical situations. eGFR's persistently <60 mL/min signify possible Chronic Kidney Disease.    Anion gap 12 5 - 15  CBC     Status: Abnormal   Collection Time: 07/27/16  1:39 AM  Result Value Ref Range   WBC 16.5 (H) 3.8 - 10.6 K/uL   RBC 3.63 (L) 4.40 - 5.90 MIL/uL   Hemoglobin 11.2 (L) 13.0 - 18.0 g/dL   HCT 33.8 (L) 40.0 - 52.0 %   MCV 93.0 80.0 - 100.0 fL   MCH 30.7 26.0 - 34.0 pg   MCHC 33.1 32.0 - 36.0 g/dL   RDW 17.2 (H) 11.5 - 14.5 %   Platelets 111 (L) 150 - 440 K/uL  Heparin level (unfractionated)     Status: None   Collection Time: 07/27/16  1:39 AM  Result Value Ref Range   Heparin Unfractionated 0.31 0.30 - 0.70 IU/mL    Comment:        IF HEPARIN RESULTS ARE BELOW EXPECTED VALUES, AND PATIENT DOSAGE HAS BEEN CONFIRMED, SUGGEST FOLLOW UP TESTING OF ANTITHROMBIN III LEVELS.   Glucose, capillary      Status: None   Collection Time: 07/27/16  8:19 AM  Result Value Ref Range   Glucose-Capillary 91 65 - 99 mg/dL  ECHOCARDIOGRAM COMPLETE     Status: None   Collection Time: 07/27/16 10:03 AM  Result Value Ref Range   Weight 2,800 oz   Height 78 in   BP 104/63 mmHg  Glucose, capillary     Status: Abnormal   Collection Time: 07/27/16 11:39 AM  Result Value Ref Range   Glucose-Capillary 125 (H) 65 - 99 mg/dL  Cath Tip Culture     Status: Abnormal   Collection Time: 07/27/16 12:07 PM  Result Value Ref Range   Specimen Description CATH TIP    Special Requests NONE    Culture (A)     >=100,000 COLONIES/mL METHICILLIN RESISTANT STAPHYLOCOCCUS AUREUS   Report Status 07/30/2016 FINAL    Organism ID, Bacteria METHICILLIN RESISTANT STAPHYLOCOCCUS AUREUS (A)       Susceptibility   Methicillin resistant staphylococcus aureus - MIC*    CIPROFLOXACIN >=8 RESISTANT Resistant     ERYTHROMYCIN >=8 RESISTANT Resistant     GENTAMICIN <=0.5 SENSITIVE Sensitive     OXACILLIN >=4 RESISTANT Resistant     TETRACYCLINE <=1 SENSITIVE Sensitive     VANCOMYCIN 1 SENSITIVE Sensitive     TRIMETH/SULFA <=10 SENSITIVE Sensitive     CLINDAMYCIN RESISTANT Resistant     RIFAMPIN <=0.5 SENSITIVE Sensitive     Inducible Clindamycin POSITIVE Resistant     * >=100,000 COLONIES/mL METHICILLIN RESISTANT STAPHYLOCOCCUS AUREUS  Glucose, capillary     Status: None   Collection Time: 07/27/16  4:36 PM  Result Value Ref Range   Glucose-Capillary 87 65 - 99 mg/dL  Blood gas, arterial     Status: Abnormal   Collection Time: 07/27/16  5:19 PM  Result Value Ref Range   FIO2 0.28    pH, Arterial 7.51 (H) 7.350 - 7.450   pCO2 arterial 34 32.0 - 48.0 mmHg   pO2, Arterial 85 83.0 - 108.0 mmHg   Bicarbonate  27.1 20.0 - 28.0 mmol/L   Acid-Base Excess 4.2 (H) 0.0 - 2.0 mmol/L   O2 Saturation 97.3 %   Patient temperature 37.0    Collection site LEFT BRACHIAL    Sample type ARTERIAL DRAW    Allens test (pass/fail) PASS  PASS  Glucose, capillary     Status: Abnormal   Collection Time: 07/27/16  9:02 PM  Result Value Ref Range   Glucose-Capillary 122 (H) 65 - 99 mg/dL  Glucose, capillary     Status: None   Collection Time: 07/28/16  7:40 AM  Result Value Ref Range   Glucose-Capillary 84 65 - 99 mg/dL  Glucose, capillary     Status: None   Collection Time: 07/28/16 11:50 AM  Result Value Ref Range   Glucose-Capillary 90 65 - 99 mg/dL  CULTURE, BLOOD (ROUTINE X 2) w Reflex to ID Panel     Status: None   Collection Time: 07/28/16  1:59 PM  Result Value Ref Range   Specimen Description BLOOD  LEFT HAND    Special Requests BOTTLES DRAWN AEROBIC AND ANAEROBIC  BCAV    Culture NO GROWTH 5 DAYS    Report Status 08/02/2016 FINAL   CULTURE, BLOOD (ROUTINE X 2) w Reflex to ID Panel     Status: None   Collection Time: 07/28/16  2:10 PM  Result Value Ref Range   Specimen Description BLOOD  LEFT WRIST    Special Requests BOTTLES DRAWN AEROBIC AND ANAEROBIC  BCAV    Culture NO GROWTH 5 DAYS    Report Status 08/02/2016 FINAL   Glucose, capillary     Status: Abnormal   Collection Time: 07/28/16  5:07 PM  Result Value Ref Range   Glucose-Capillary 103 (H) 65 - 99 mg/dL  Glucose, capillary     Status: None   Collection Time: 07/28/16  9:15 PM  Result Value Ref Range   Glucose-Capillary 98 65 - 99 mg/dL  Glucose, capillary     Status: None   Collection Time: 07/29/16  7:34 AM  Result Value Ref Range   Glucose-Capillary 82 65 - 99 mg/dL  Glucose, capillary     Status: Abnormal   Collection Time: 07/29/16 11:41 AM  Result Value Ref Range   Glucose-Capillary 128 (H) 65 - 99 mg/dL  Renal function panel     Status: Abnormal   Collection Time: 07/29/16  4:49 PM  Result Value Ref Range   Sodium 137 135 - 145 mmol/L   Potassium 6.0 (H) 3.5 - 5.1 mmol/L   Chloride 96 (L) 101 - 111 mmol/L   CO2 24 22 - 32 mmol/L   Glucose, Bld 93 65 - 99 mg/dL   BUN 120 (H) 6 - 20 mg/dL    Comment: RESULT CONFIRMED BY MANUAL  DILUTION   Creatinine, Ser 15.92 (H) 0.61 - 1.24 mg/dL   Calcium 8.3 (L) 8.9 - 10.3 mg/dL   Phosphorus 10.0 (H) 2.5 - 4.6 mg/dL   Albumin 2.5 (L) 3.5 - 5.0 g/dL   GFR calc non Af Amer 3 (L) >60 mL/min   GFR calc Af Amer 3 (L) >60 mL/min    Comment: (NOTE) The eGFR has been calculated using the CKD EPI equation. This calculation has not been validated in all clinical situations. eGFR's persistently <60 mL/min signify possible Chronic Kidney Disease.    Anion gap 17 (H) 5 - 15  CBC     Status: Abnormal   Collection Time: 07/29/16  4:49 PM  Result Value Ref Range  WBC 6.2 3.8 - 10.6 K/uL   RBC 3.28 (L) 4.40 - 5.90 MIL/uL   Hemoglobin 10.2 (L) 13.0 - 18.0 g/dL   HCT 30.5 (L) 40.0 - 52.0 %   MCV 92.8 80.0 - 100.0 fL   MCH 31.0 26.0 - 34.0 pg   MCHC 33.4 32.0 - 36.0 g/dL   RDW 16.3 (H) 11.5 - 14.5 %   Platelets 114 (L) 150 - 440 K/uL  Glucose, capillary     Status: Abnormal   Collection Time: 07/29/16  8:59 PM  Result Value Ref Range   Glucose-Capillary 125 (H) 65 - 99 mg/dL   Comment 1 Notify RN   CBC     Status: Abnormal   Collection Time: 07/30/16  4:51 AM  Result Value Ref Range   WBC 5.6 3.8 - 10.6 K/uL   RBC 3.36 (L) 4.40 - 5.90 MIL/uL   Hemoglobin 10.5 (L) 13.0 - 18.0 g/dL   HCT 31.2 (L) 40.0 - 52.0 %   MCV 92.9 80.0 - 100.0 fL   MCH 31.4 26.0 - 34.0 pg   MCHC 33.8 32.0 - 36.0 g/dL   RDW 16.5 (H) 11.5 - 14.5 %   Platelets 120 (L) 150 - 440 K/uL  Glucose, capillary     Status: Abnormal   Collection Time: 07/30/16  7:43 AM  Result Value Ref Range   Glucose-Capillary 102 (H) 65 - 99 mg/dL  CBC     Status: Abnormal   Collection Time: 07/30/16 10:30 AM  Result Value Ref Range   WBC 5.8 3.8 - 10.6 K/uL   RBC 3.34 (L) 4.40 - 5.90 MIL/uL   Hemoglobin 10.4 (L) 13.0 - 18.0 g/dL   HCT 31.2 (L) 40.0 - 52.0 %   MCV 93.4 80.0 - 100.0 fL   MCH 31.2 26.0 - 34.0 pg   MCHC 33.4 32.0 - 36.0 g/dL   RDW 16.2 (H) 11.5 - 14.5 %   Platelets 126 (L) 150 - 440 K/uL  Renal function  panel     Status: Abnormal   Collection Time: 07/30/16 11:09 AM  Result Value Ref Range   Sodium 137 135 - 145 mmol/L   Potassium 4.9 3.5 - 5.1 mmol/L   Chloride 97 (L) 101 - 111 mmol/L   CO2 29 22 - 32 mmol/L   Glucose, Bld 99 65 - 99 mg/dL   BUN 60 (H) 6 - 20 mg/dL   Creatinine, Ser 9.70 (H) 0.61 - 1.24 mg/dL   Calcium 8.7 (L) 8.9 - 10.3 mg/dL   Phosphorus 7.5 (H) 2.5 - 4.6 mg/dL   Albumin 2.4 (L) 3.5 - 5.0 g/dL   GFR calc non Af Amer 5 (L) >60 mL/min   GFR calc Af Amer 6 (L) >60 mL/min    Comment: (NOTE) The eGFR has been calculated using the CKD EPI equation. This calculation has not been validated in all clinical situations. eGFR's persistently <60 mL/min signify possible Chronic Kidney Disease.    Anion gap 11 5 - 15  Glucose, capillary     Status: Abnormal   Collection Time: 07/30/16  4:33 PM  Result Value Ref Range   Glucose-Capillary 113 (H) 65 - 99 mg/dL  Glucose, capillary     Status: Abnormal   Collection Time: 07/30/16  9:35 PM  Result Value Ref Range   Glucose-Capillary 107 (H) 65 - 99 mg/dL   Comment 1 Notify RN   Vancomycin, random     Status: None   Collection Time: 07/31/16  5:23 AM  Result Value Ref Range   Vancomycin Rm 17     Comment:        Random Vancomycin therapeutic range is dependent on dosage and time of specimen collection. A peak range is 20.0-40.0 ug/mL A trough range is 5.0-15.0 ug/mL          Glucose, capillary     Status: None   Collection Time: 07/31/16  7:33 AM  Result Value Ref Range   Glucose-Capillary 99 65 - 99 mg/dL  Glucose, capillary     Status: None   Collection Time: 07/31/16 11:21 AM  Result Value Ref Range   Glucose-Capillary 84 65 - 99 mg/dL  Basic metabolic panel     Status: Abnormal   Collection Time: 08/03/16  4:02 PM  Result Value Ref Range   Sodium 136 135 - 145 mmol/L   Potassium 4.5 3.5 - 5.1 mmol/L   Chloride 97 (L) 101 - 111 mmol/L   CO2 26 22 - 32 mmol/L   Glucose, Bld 88 65 - 99 mg/dL   BUN 57 (H) 6  - 20 mg/dL   Creatinine, Ser 9.54 (H) 0.61 - 1.24 mg/dL   Calcium 9.3 8.9 - 10.3 mg/dL   GFR calc non Af Amer 5 (L) >60 mL/min   GFR calc Af Amer 6 (L) >60 mL/min    Comment: (NOTE) The eGFR has been calculated using the CKD EPI equation. This calculation has not been validated in all clinical situations. eGFR's persistently <60 mL/min signify possible Chronic Kidney Disease.    Anion gap 13 5 - 15  CBC     Status: Abnormal   Collection Time: 08/03/16  4:02 PM  Result Value Ref Range   WBC 9.8 3.8 - 10.6 K/uL   RBC 3.59 (L) 4.40 - 5.90 MIL/uL   Hemoglobin 11.3 (L) 13.0 - 18.0 g/dL   HCT 33.4 (L) 40.0 - 52.0 %   MCV 93.1 80.0 - 100.0 fL   MCH 31.6 26.0 - 34.0 pg   MCHC 34.0 32.0 - 36.0 g/dL   RDW 16.4 (H) 11.5 - 14.5 %   Platelets 232 150 - 440 K/uL  Troponin I     Status: Abnormal   Collection Time: 08/03/16  4:02 PM  Result Value Ref Range   Troponin I 0.21 (HH) <0.03 ng/mL    Comment: CRITICAL RESULT CALLED TO, READ BACK BY AND VERIFIED WITH TINA CARR AT 1633 ON 08/03/2016 JLJ   Lactic acid, plasma     Status: None   Collection Time: 08/03/16  4:46 PM  Result Value Ref Range   Lactic Acid, Venous 1.3 0.5 - 1.9 mmol/L  Hepatic function panel     Status: Abnormal   Collection Time: 08/03/16  4:57 PM  Result Value Ref Range   Total Protein 7.8 6.5 - 8.1 g/dL   Albumin 2.9 (L) 3.5 - 5.0 g/dL   AST 51 (H) 15 - 41 U/L   ALT 30 17 - 63 U/L   Alkaline Phosphatase 78 38 - 126 U/L   Total Bilirubin 0.7 0.3 - 1.2 mg/dL   Bilirubin, Direct <0.1 (L) 0.1 - 0.5 mg/dL   Indirect Bilirubin NOT CALCULATED 0.3 - 0.9 mg/dL  Protime-INR     Status: None   Collection Time: 08/03/16  4:57 PM  Result Value Ref Range   Prothrombin Time 13.3 11.4 - 15.2 seconds   INR 1.01   Brain natriuretic peptide     Status: Abnormal   Collection Time: 08/03/16  4:57  PM  Result Value Ref Range   B Natriuretic Peptide 800.0 (H) 0.0 - 100.0 pg/mL  APTT     Status: None   Collection Time: 08/03/16   4:57 PM  Result Value Ref Range   aPTT 33 24 - 36 seconds  Troponin I     Status: Abnormal   Collection Time: 08/03/16  7:54 PM  Result Value Ref Range   Troponin I 0.22 (HH) <0.03 ng/mL    Comment: CRITICAL VALUE NOTED. VALUE IS CONSISTENT WITH PREVIOUSLY REPORTED/CALLED VALUE JLJ  Troponin I     Status: Abnormal   Collection Time: 08/04/16 12:09 AM  Result Value Ref Range   Troponin I 0.27 (HH) <0.03 ng/mL    Comment: CRITICAL VALUE NOTED. VALUE IS CONSISTENT WITH PREVIOUSLY REPORTED/CALLED VALUE.PMH  Heparin level (unfractionated)     Status: None   Collection Time: 08/04/16  4:56 AM  Result Value Ref Range   Heparin Unfractionated 0.54 0.30 - 0.70 IU/mL    Comment:        IF HEPARIN RESULTS ARE BELOW EXPECTED VALUES, AND PATIENT DOSAGE HAS BEEN CONFIRMED, SUGGEST FOLLOW UP TESTING OF ANTITHROMBIN III LEVELS.   Troponin I     Status: Abnormal   Collection Time: 08/04/16  4:56 AM  Result Value Ref Range   Troponin I 0.38 (HH) <0.03 ng/mL    Comment: CRITICAL VALUE NOTED. VALUE IS CONSISTENT WITH PREVIOUSLY REPORTED/CALLED VALUE.PMH  Basic metabolic panel     Status: Abnormal   Collection Time: 08/04/16  4:56 AM  Result Value Ref Range   Sodium 136 135 - 145 mmol/L   Potassium 4.5 3.5 - 5.1 mmol/L   Chloride 100 (L) 101 - 111 mmol/L   CO2 25 22 - 32 mmol/L   Glucose, Bld 107 (H) 65 - 99 mg/dL   BUN 65 (H) 6 - 20 mg/dL   Creatinine, Ser 10.83 (H) 0.61 - 1.24 mg/dL   Calcium 8.7 (L) 8.9 - 10.3 mg/dL   GFR calc non Af Amer 4 (L) >60 mL/min   GFR calc Af Amer 5 (L) >60 mL/min    Comment: (NOTE) The eGFR has been calculated using the CKD EPI equation. This calculation has not been validated in all clinical situations. eGFR's persistently <60 mL/min signify possible Chronic Kidney Disease.    Anion gap 11 5 - 15  CBC     Status: Abnormal   Collection Time: 08/04/16  4:56 AM  Result Value Ref Range   WBC 8.0 3.8 - 10.6 K/uL   RBC 3.04 (L) 4.40 - 5.90 MIL/uL    Hemoglobin 9.5 (L) 13.0 - 18.0 g/dL   HCT 28.6 (L) 40.0 - 52.0 %   MCV 94.1 80.0 - 100.0 fL   MCH 31.3 26.0 - 34.0 pg   MCHC 33.3 32.0 - 36.0 g/dL   RDW 16.3 (H) 11.5 - 14.5 %   Platelets 218 150 - 440 K/uL  Troponin I     Status: Abnormal   Collection Time: 08/04/16 11:15 AM  Result Value Ref Range   Troponin I 0.75 (HH) <0.03 ng/mL    Comment: CRITICAL VALUE NOTED. VALUE IS CONSISTENT WITH PREVIOUSLY REPORTED/CALLED VALUE [facility]   Heparin level (unfractionated)     Status: None   Collection Time: 08/04/16 12:57 PM  Result Value Ref Range   Heparin Unfractionated 0.49 0.30 - 0.70 IU/mL    Comment:        IF HEPARIN RESULTS ARE BELOW EXPECTED VALUES, AND PATIENT DOSAGE HAS BEEN CONFIRMED,  SUGGEST FOLLOW UP TESTING OF ANTITHROMBIN III LEVELS.   Glucose, capillary     Status: None   Collection Time: 08/04/16  4:55 PM  Result Value Ref Range   Glucose-Capillary 71 65 - 99 mg/dL  Protime-INR     Status: None   Collection Time: 08/05/16  5:17 AM  Result Value Ref Range   Prothrombin Time 14.6 11.4 - 15.2 seconds   INR 1.13   Heparin level (unfractionated)     Status: None   Collection Time: 08/05/16  5:17 AM  Result Value Ref Range   Heparin Unfractionated 0.37 0.30 - 0.70 IU/mL    Comment:        IF HEPARIN RESULTS ARE BELOW EXPECTED VALUES, AND PATIENT DOSAGE HAS BEEN CONFIRMED, SUGGEST FOLLOW UP TESTING OF ANTITHROMBIN III LEVELS.   CBC     Status: Abnormal   Collection Time: 08/05/16  5:17 AM  Result Value Ref Range   WBC 8.2 3.8 - 10.6 K/uL   RBC 3.06 (L) 4.40 - 5.90 MIL/uL   Hemoglobin 9.6 (L) 13.0 - 18.0 g/dL   HCT 28.8 (L) 40.0 - 52.0 %   MCV 94.2 80.0 - 100.0 fL   MCH 31.5 26.0 - 34.0 pg   MCHC 33.4 32.0 - 36.0 g/dL   RDW 16.4 (H) 11.5 - 14.5 %   Platelets 236 150 - 440 K/uL  Phosphorus     Status: Abnormal   Collection Time: 08/05/16  5:18 AM  Result Value Ref Range   Phosphorus 5.6 (H) 2.5 - 4.6 mg/dL  Protime-INR     Status: Abnormal   Collection  Time: 08/06/16  5:13 AM  Result Value Ref Range   Prothrombin Time 17.8 (H) 11.4 - 15.2 seconds   INR 1.45   CBC     Status: Abnormal   Collection Time: 08/06/16  5:13 AM  Result Value Ref Range   WBC 9.1 3.8 - 10.6 K/uL   RBC 3.09 (L) 4.40 - 5.90 MIL/uL   Hemoglobin 9.7 (L) 13.0 - 18.0 g/dL   HCT 28.6 (L) 40.0 - 52.0 %   MCV 92.5 80.0 - 100.0 fL   MCH 31.4 26.0 - 34.0 pg   MCHC 33.9 32.0 - 36.0 g/dL   RDW 16.4 (H) 11.5 - 14.5 %   Platelets 234 150 - 440 K/uL  Heparin level (unfractionated)     Status: None   Collection Time: 08/06/16  5:13 AM  Result Value Ref Range   Heparin Unfractionated 0.30 0.30 - 0.70 IU/mL    Comment:        IF HEPARIN RESULTS ARE BELOW EXPECTED VALUES, AND PATIENT DOSAGE HAS BEEN CONFIRMED, SUGGEST FOLLOW UP TESTING OF ANTITHROMBIN III LEVELS.   Heparin level (unfractionated)     Status: None   Collection Time: 08/06/16  2:22 PM  Result Value Ref Range   Heparin Unfractionated 0.51 0.30 - 0.70 IU/mL    Comment:        IF HEPARIN RESULTS ARE BELOW EXPECTED VALUES, AND PATIENT DOSAGE HAS BEEN CONFIRMED, SUGGEST FOLLOW UP TESTING OF ANTITHROMBIN III LEVELS.   Heparin level (unfractionated)     Status: None   Collection Time: 08/06/16  9:38 PM  Result Value Ref Range   Heparin Unfractionated 0.64 0.30 - 0.70 IU/mL    Comment:        IF HEPARIN RESULTS ARE BELOW EXPECTED VALUES, AND PATIENT DOSAGE HAS BEEN CONFIRMED, SUGGEST FOLLOW UP TESTING OF ANTITHROMBIN III LEVELS.   Protime-INR  Status: Abnormal   Collection Time: 08/07/16  4:55 AM  Result Value Ref Range   Prothrombin Time 24.6 (H) 11.4 - 15.2 seconds   INR 2.18   CBC     Status: Abnormal   Collection Time: 08/07/16  4:55 AM  Result Value Ref Range   WBC 9.3 3.8 - 10.6 K/uL   RBC 3.06 (L) 4.40 - 5.90 MIL/uL   Hemoglobin 9.6 (L) 13.0 - 18.0 g/dL   HCT 28.5 (L) 40.0 - 52.0 %   MCV 93.3 80.0 - 100.0 fL   MCH 31.4 26.0 - 34.0 pg   MCHC 33.6 32.0 - 36.0 g/dL   RDW 16.6 (H) 11.5  - 14.5 %   Platelets 242 150 - 440 K/uL  Vancomycin, random     Status: None   Collection Time: 08/07/16  4:55 AM  Result Value Ref Range   Vancomycin Rm 25     Comment:        Random Vancomycin therapeutic range is dependent on dosage and time of specimen collection. A peak range is 20.0-40.0 ug/mL A trough range is 5.0-15.0 ug/mL          Heparin level (unfractionated)     Status: None   Collection Time: 08/07/16  4:55 AM  Result Value Ref Range   Heparin Unfractionated 0.55 0.30 - 0.70 IU/mL    Comment:        IF HEPARIN RESULTS ARE BELOW EXPECTED VALUES, AND PATIENT DOSAGE HAS BEEN CONFIRMED, SUGGEST FOLLOW UP TESTING OF ANTITHROMBIN III LEVELS.   Phosphorus     Status: Abnormal   Collection Time: 08/07/16 10:31 AM  Result Value Ref Range   Phosphorus 6.0 (H) 2.5 - 4.6 mg/dL  Basic metabolic panel     Status: Abnormal   Collection Time: 08/21/16 12:49 AM  Result Value Ref Range   Sodium 135 135 - 145 mmol/L   Potassium 6.2 (H) 3.5 - 5.1 mmol/L   Chloride 103 101 - 111 mmol/L   CO2 21 (L) 22 - 32 mmol/L   Glucose, Bld 101 (H) 65 - 99 mg/dL   BUN 67 (H) 6 - 20 mg/dL   Creatinine, Ser 11.19 (H) 0.61 - 1.24 mg/dL   Calcium 9.3 8.9 - 10.3 mg/dL   GFR calc non Af Amer 4 (L) >60 mL/min   GFR calc Af Amer 5 (L) >60 mL/min    Comment: (NOTE) The eGFR has been calculated using the CKD EPI equation. This calculation has not been validated in all clinical situations. eGFR's persistently <60 mL/min signify possible Chronic Kidney Disease.    Anion gap 11 5 - 15  CBC     Status: Abnormal   Collection Time: 08/21/16 12:49 AM  Result Value Ref Range   WBC 7.0 3.8 - 10.6 K/uL   RBC 3.30 (L) 4.40 - 5.90 MIL/uL   Hemoglobin 10.3 (L) 13.0 - 18.0 g/dL   HCT 30.8 (L) 40.0 - 52.0 %   MCV 93.5 80.0 - 100.0 fL   MCH 31.3 26.0 - 34.0 pg   MCHC 33.5 32.0 - 36.0 g/dL   RDW 17.4 (H) 11.5 - 14.5 %   Platelets 174 150 - 440 K/uL  Troponin I     Status: Abnormal   Collection Time:  08/21/16 12:49 AM  Result Value Ref Range   Troponin I 1.71 (HH) <0.03 ng/mL    Comment: CRITICAL RESULT CALLED TO, READ BACK BY AND VERIFIED WITH KELLY GIBSON ON 08/21/16 AT 0132 [facility]  Protime-INR     Status: Abnormal   Collection Time: 08/21/16 12:49 AM  Result Value Ref Range   Prothrombin Time 28.9 (H) 11.4 - 15.2 seconds   INR 2.66   TSH     Status: None   Collection Time: 08/21/16  6:53 AM  Result Value Ref Range   TSH 0.860 0.350 - 4.500 uIU/mL    Comment: Performed by a 3rd Generation assay with a functional sensitivity of <=0.01 uIU/mL.  Troponin I     Status: Abnormal   Collection Time: 08/21/16  6:53 AM  Result Value Ref Range   Troponin I 1.69 (HH) <0.03 ng/mL    Comment: CRITICAL VALUE NOTED. VALUE IS CONSISTENT WITH PREVIOUSLY REPORTED/CALLED VALUE/HKP  Hemoglobin A1c     Status: None   Collection Time: 08/21/16  6:53 AM  Result Value Ref Range   Hgb A1c MFr Bld 5.1 4.8 - 5.6 %    Comment: (NOTE)         Pre-diabetes: 5.7 - 6.4         Diabetes: >6.4         Glycemic control for adults with diabetes: <7.0    Mean Plasma Glucose 100 mg/dL    Comment: (NOTE) Performed At: [facility] [address] [place], [place] [id number] Lindon Romp MD YC:[id number]   APTT     Status: Abnormal   Collection Time: 08/21/16  6:53 AM  Result Value Ref Range   aPTT 87 (H) 24 - 36 seconds    Comment:        IF BASELINE aPTT IS ELEVATED, SUGGEST PATIENT RISK ASSESSMENT BE USED TO DETERMINE APPROPRIATE ANTICOAGULANT THERAPY.   Heparin level (unfractionated)     Status: Abnormal   Collection Time: 08/21/16  6:53 AM  Result Value Ref Range   Heparin Unfractionated <0.10 (L) 0.30 - 0.70 IU/mL    Comment:        IF HEPARIN RESULTS ARE BELOW EXPECTED VALUES, AND PATIENT DOSAGE HAS BEEN CONFIRMED, SUGGEST FOLLOW UP TESTING OF ANTITHROMBIN III LEVELS.   Heparin level (unfractionated)     Status: None   Collection Time: 08/21/16 12:45 PM  Result Value Ref Range    Heparin Unfractionated 0.44 0.30 - 0.70 IU/mL    Comment:        IF HEPARIN RESULTS ARE BELOW EXPECTED VALUES, AND PATIENT DOSAGE HAS BEEN CONFIRMED, SUGGEST FOLLOW UP TESTING OF ANTITHROMBIN III LEVELS.   Troponin I     Status: Abnormal   Collection Time: 08/21/16 12:45 PM  Result Value Ref Range   Troponin I 1.80 (HH) <0.03 ng/mL    Comment: CRITICAL VALUE NOTED. VALUE IS CONSISTENT WITH PREVIOUSLY REPORTED/CALLED VALUE/HKP  Vancomycin, random     Status: None   Collection Time: 08/21/16  3:17 PM  Result Value Ref Range   Vancomycin Rm 12     Comment:        Random Vancomycin therapeutic range is dependent on dosage and time of specimen collection. A peak range is 20.0-40.0 ug/mL A trough range is 5.0-15.0 ug/mL          Troponin I     Status: Abnormal   Collection Time: 08/21/16  6:48 PM  Result Value Ref Range   Troponin I 1.66 (HH) <0.03 ng/mL    Comment: CRITICAL VALUE NOTED. VALUE IS CONSISTENT WITH PREVIOUSLY REPORTED/CALLED VALUE. [place].  Heparin level (unfractionated)     Status: None   Collection Time: 08/21/16  8:46 PM  Result Value Ref  Range   Heparin Unfractionated 0.34 0.30 - 0.70 IU/mL    Comment:        IF HEPARIN RESULTS ARE BELOW EXPECTED VALUES, AND PATIENT DOSAGE HAS BEEN CONFIRMED, SUGGEST FOLLOW UP TESTING OF ANTITHROMBIN III LEVELS.   Heparin level (unfractionated)     Status: Abnormal   Collection Time: 08/22/16  4:15 AM  Result Value Ref Range   Heparin Unfractionated 0.26 (L) 0.30 - 0.70 IU/mL    Comment:        IF HEPARIN RESULTS ARE BELOW EXPECTED VALUES, AND PATIENT DOSAGE HAS BEEN CONFIRMED, SUGGEST FOLLOW UP TESTING OF ANTITHROMBIN III LEVELS.   Basic metabolic panel     Status: Abnormal   Collection Time: 08/22/16  4:15 AM  Result Value Ref Range   Sodium 135 135 - 145 mmol/L   Potassium 5.4 (H) 3.5 - 5.1 mmol/L   Chloride 100 (L) 101 - 111 mmol/L   CO2 26 22 - 32 mmol/L   Glucose, Bld 100 (H) 65 - 99 mg/dL   BUN 53 (H) 6 -  20 mg/dL   Creatinine, Ser 8.37 (H) 0.61 - 1.24 mg/dL   Calcium 8.8 (L) 8.9 - 10.3 mg/dL   GFR calc non Af Amer 6 (L) >60 mL/min   GFR calc Af Amer 7 (L) >60 mL/min    Comment: (NOTE) The eGFR has been calculated using the CKD EPI equation. This calculation has not been validated in all clinical situations. eGFR's persistently <60 mL/min signify possible Chronic Kidney Disease.    Anion gap 9 5 - 15    Radiology Dg Chest Port 1 View  Result Date: 08/21/2016 CLINICAL DATA:  Acute onset of shortness of breath and generalized chest pain. Initial encounter. EXAM: PORTABLE CHEST 1 VIEW COMPARISON:  Chest radiograph and CTA of the chest performed 08/03/2016 FINDINGS: The lungs are well-aerated. A small right pleural effusion is noted. Increased interstitial markings raise concern for mild interstitial edema. No pneumothorax is seen. The cardiomediastinal silhouette is mildly enlarged. A left-sided dual-lumen catheter is noted ending about the right cavoatrial junction. No acute osseous abnormalities are seen. IMPRESSION: Small right pleural effusion. Increased interstitial markings raise concern for mild interstitial edema. Mild cardiomegaly noted. Electronically Signed   By: Garald Balding M.D.   On: 08/21/2016 01:38      Assessment/Plan  Hyperkalemia Has caused cancellation of his access surgery on multiple occasions.  Hyperlipidemia lipid control important in reducing the progression of atherosclerotic disease. Continue statin therapy   End stage renal disease Needs a permanent access. We will plan to go ahead and reschedule his left arm access procedure that has been scheduled multiple times in the past. Risks and benefits of placement discussed. He is agreeable to proceed.  HYPERTENSION, BENIGN blood pressure control important in reducing the progression of atherosclerotic disease. On appropriate oral medications.   PVD (peripheral vascular disease) ([place]) His noninvasive studies  today demonstrate tibial disease with the anterior tibial artery being the dominant runoff into the foot, but no other focal stenoses identified. His right ABI was noncompressible. 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 He has a litany of atherosclerotic risk factors. Appropriate lifestyle modifications were again discussed. We will continue to monitor his vascular disease with noninvasive studies in about 6 months.    Leotis Pain, MD  09/03/2016 4:32 PM    This note was created with Dragon medical transcription system.  Any errors from dictation are purely unintentional

## 2016-09-04 ENCOUNTER — Other Ambulatory Visit (INDEPENDENT_AMBULATORY_CARE_PROVIDER_SITE_OTHER): Payer: Self-pay | Admitting: Vascular Surgery

## 2016-09-05 ENCOUNTER — Telehealth (INDEPENDENT_AMBULATORY_CARE_PROVIDER_SITE_OTHER): Payer: Self-pay

## 2016-09-05 ENCOUNTER — Inpatient Hospital Stay: Admission: RE | Admit: 2016-09-05 | Payer: Medicare Other | Source: Ambulatory Visit

## 2016-09-05 NOTE — Telephone Encounter (Signed)
Larena Glassman from pre-admit called stating that the patient was a no show for pre-op today and his surgery is scheduled for 09/12/16. The patient is a resident of [facility]. Larena Glassman states she called to speak with the nurse that takes care of him and was sent to a voice mail and left a message, no one contacted her so she called again to speak with someone and again she was no one picked up.

## 2016-09-11 ENCOUNTER — Encounter (INDEPENDENT_AMBULATORY_CARE_PROVIDER_SITE_OTHER): Payer: Self-pay

## 2016-09-19 ENCOUNTER — Ambulatory Visit: Payer: Medicare Other

## 2016-09-19 ENCOUNTER — Encounter
Admission: RE | Admit: 2016-09-19 | Discharge: 2016-09-19 | Disposition: A | Discharge status: Home / Self Care | Payer: Medicare Other | Source: Ambulatory Visit | Attending: Vascular Surgery | Admitting: Vascular Surgery

## 2016-09-19 ENCOUNTER — Ambulatory Visit
Admission: RE | Admit: 2016-09-19 | Discharge: 2016-09-19 | Disposition: A | Discharge status: Home / Self Care | Payer: Medicare Other | Source: Ambulatory Visit | Attending: Anesthesiology | Admitting: Anesthesiology

## 2016-09-19 DIAGNOSIS — Z01810 Encounter for preprocedural cardiovascular examination: Secondary | ICD-10-CM | POA: Insufficient documentation

## 2016-09-19 DIAGNOSIS — R918 Other nonspecific abnormal finding of lung field: Secondary | ICD-10-CM

## 2016-09-19 DIAGNOSIS — Z01818 Encounter for other preprocedural examination: Secondary | ICD-10-CM | POA: Insufficient documentation

## 2016-09-19 DIAGNOSIS — J90 Pleural effusion, not elsewhere classified: Secondary | ICD-10-CM | POA: Diagnosis not present

## 2016-09-19 DIAGNOSIS — R9431 Abnormal electrocardiogram [ECG] [EKG]: Secondary | ICD-10-CM | POA: Diagnosis not present

## 2016-09-19 DIAGNOSIS — N186 End stage renal disease: Secondary | ICD-10-CM | POA: Insufficient documentation

## 2016-09-19 DIAGNOSIS — I517 Cardiomegaly: Secondary | ICD-10-CM | POA: Insufficient documentation

## 2016-09-19 HISTORY — DX: Complete traumatic amputation of one unspecified lesser toe, initial encounter: S98.139A

## 2016-09-19 HISTORY — DX: Acquired absence of left leg below knee: Z89.512

## 2016-09-19 HISTORY — DX: Personal history of pulmonary embolism: Z86.711

## 2016-09-19 HISTORY — DX: Cardiomyopathy, unspecified: I42.9

## 2016-09-19 LAB — CBC WITH DIFFERENTIAL/PLATELET
Basophils Absolute: 0.1 10*3/uL (ref 0–0.1)
Basophils Relative: 1 %
EOS ABS: 0.5 10*3/uL (ref 0–0.7)
EOS PCT: 7 %
HCT: 35.9 % — ABNORMAL LOW (ref 40.0–52.0)
Hemoglobin: 11.8 g/dL — ABNORMAL LOW (ref 13.0–18.0)
Lymphocytes Relative: 27 %
Lymphs Abs: 1.7 10*3/uL (ref 1.0–3.6)
MCH: 30.6 pg (ref 26.0–34.0)
MCHC: 32.9 g/dL (ref 32.0–36.0)
MCV: 92.8 fL (ref 80.0–100.0)
MONOS PCT: 20 %
Monocytes Absolute: 1.3 10*3/uL — ABNORMAL HIGH (ref 0.2–1.0)
Neutro Abs: 2.8 10*3/uL (ref 1.4–6.5)
Neutrophils Relative %: 45 %
PLATELETS: 173 10*3/uL (ref 150–440)
RBC: 3.87 MIL/uL — ABNORMAL LOW (ref 4.40–5.90)
RDW: 15.6 % — AB (ref 11.5–14.5)
WBC: 6.3 10*3/uL (ref 3.8–10.6)

## 2016-09-19 LAB — TYPE AND SCREEN
ABO/RH(D): B POS
ANTIBODY SCREEN: NEGATIVE

## 2016-09-19 LAB — BASIC METABOLIC PANEL
Anion gap: 16 — ABNORMAL HIGH (ref 5–15)
BUN: 78 mg/dL — AB (ref 6–20)
CHLORIDE: 94 mmol/L — AB (ref 101–111)
CO2: 21 mmol/L — AB (ref 22–32)
CREATININE: 9.44 mg/dL — AB (ref 0.61–1.24)
Calcium: 9.7 mg/dL (ref 8.9–10.3)
GFR calc Af Amer: 6 mL/min — ABNORMAL LOW (ref >60)
GFR calc non Af Amer: 5 mL/min — ABNORMAL LOW (ref >60)
Glucose, Bld: 85 mg/dL (ref 65–99)
Potassium: 6.2 mmol/L — ABNORMAL HIGH (ref 3.5–5.1)
SODIUM: 131 mmol/L — AB (ref 135–145)

## 2016-09-19 LAB — PROTIME-INR
INR: 2.39
PROTHROMBIN TIME: 26.5 s — AB (ref 11.4–15.2)

## 2016-09-19 LAB — APTT: aPTT: 47 seconds — ABNORMAL HIGH (ref 24–36)

## 2016-09-19 LAB — SURGICAL PCR SCREEN
MRSA, PCR: NEGATIVE
Staphylococcus aureus: NEGATIVE

## 2016-09-19 NOTE — Pre-Procedure Instructions (Signed)
Dr. Ronelle Nigh notified of patient CXR report and Met B results,  requested medical clearance. Instructed to notify patient kidney specialist regarding elevated potassium level.

## 2016-09-19 NOTE — Pre-Procedure Instructions (Signed)
Medical Clearance request faxed to Dr.[name] office.

## 2016-09-19 NOTE — Pre-Procedure Instructions (Signed)
Dr. Jannifer Hick notified of elevated potassium level . Instructions given by Dr.Kruska to notify [facility] to return patient to Emergency Department for a repeat EKG and potassium level (spoke to [employer]).

## 2016-09-19 NOTE — Pre-Procedure Instructions (Signed)
EKG COMPARED WITH PREVIOUS 

## 2016-09-19 NOTE — Patient Instructions (Signed)
Your procedure is scheduled on:Sep 26, 2016 (Thursday) Report to Same Day Surgery 2nd floor medical mall [facility] Entrance-take elevator on left to 2nd floor.  Check in with surgery information desk.) To find out your arrival time please call [phone] between 1PM - 3PM on Sep 25, 2016 (Wednesday)  Remember: Instructions that are not followed completely may result in serious medical risk, up to and including death, or upon the discretion of your surgeon and anesthesiologist your surgery may need to be rescheduled.    _x___ 1. Do not eat food or drink liquids after midnight. No gum chewing or hard candies                              __x__ 2. No Alcohol for 24 hours before or after surgery.   __x__3. No Smoking for 24 prior to surgery.   ____  4. Bring all medications with you on the day of surgery if instructed.    __x__ 5. Notify your doctor if there is any change in your medical condition     (cold, fever, infections).     Do not wear jewelry, make-up, hairpins, clips or nail polish.  Do not wear lotions, powders, or perfumes.   Do not shave 48 hours prior to surgery. Men may shave face and neck.  Do not bring valuables to the hospital.    [facility] is not responsible for any belongings or valuables.               Contacts, dentures or bridgework may not be worn into surgery.  Leave your suitcase in the car. After surgery it may be brought to your room.  For patients admitted to the hospital, discharge time is determined by your  treatment team.                    Patients discharged the day of surgery will not be allowed to drive home.  You will need someone to drive you home and stay with you the night of your procedure.    Please read over the following fact sheets that you were given:   [facility] Preparing for Surgery and or MRSA Information   _x___ Take anti-hypertensive (unless it includes a diuretic), cardiac, seizure, asthma,     anti-reflux and psychiatric  medicines. These include:  1. ISOSORBIDE  MONONITRATE  2. PANTOPRAZOLE (PANTOPRAZOLE AT BEDTIME ON WEDNESDAY NIGHT  MAY  2 )  3. LYRICA  4.  5.  6.  ____Fleets enema or Magnesium Citrate as directed.   _x___ Use CHG Soap or sage wipes as directed on instruction sheet   __x__ Use inhalers on the day of surgery and bring to hospital day of surgery (USE SYMBICORT AND [place] NEBULIZER THE MORNING OF SURGERY AND [place])  ____ Stop Metformin and Janumet 2 days prior to surgery.    ____ Take 1/2 of usual insulin dose the night before surgery and none on the morning surgery.      _x___ Follow recommendations from Cardiologist, Pulmonologist or PCP regarding          stopping Aspirin, Coumadin, Pllavix ,Eliquis, Effient, or Pradaxa, and Pletal. (STOP WARFARIN ON September 23, 2016 FOR SURGERY)  X____Stop Anti-inflammatories such as Advil, Aleve, Ibuprofen, Motrin, Naproxen, Naprosyn, Goodies powders or aspirin products. OK to take Tylenol    _x___ Stop supplements until after surgery.  But may continue Vitamin D, Vitamin B, and multivitamin       ____ Bring C-Pap to the hospital.

## 2016-09-20 NOTE — Pre-Procedure Instructions (Signed)
Spoke with April at Dr. Lucky Cowboy office this morning and stated she received medical clearance request faxed on September 19, 2016.

## 2016-09-25 ENCOUNTER — Other Ambulatory Visit
Admission: RE | Admit: 2016-09-25 | Discharge: 2016-09-25 | Disposition: A | Discharge status: Home / Self Care | Payer: Medicare Other | Source: Ambulatory Visit | Attending: Family Medicine | Admitting: Family Medicine

## 2016-09-25 DIAGNOSIS — E875 Hyperkalemia: Secondary | ICD-10-CM | POA: Diagnosis present

## 2016-09-25 LAB — POTASSIUM: POTASSIUM: 6.4 mmol/L — AB (ref 3.5–5.1)

## 2016-09-25 NOTE — Pre-Procedure Instructions (Signed)
SPOKE WITH LAURA. PT NOT CLEARED AFTER LAST CXR AND REPEAT KT 6.4 TODAY. SURGERY CNL FOR 09/26/16

## 2016-09-26 ENCOUNTER — Encounter: Admission: RE | Payer: Self-pay | Source: Ambulatory Visit

## 2016-09-26 ENCOUNTER — Ambulatory Visit: Admission: RE | Admit: 2016-09-26 | Payer: Medicare Other | Source: Ambulatory Visit | Admitting: Vascular Surgery

## 2016-09-26 SURGERY — ARTERIOVENOUS (AV) FISTULA CREATION
Anesthesia: General | Laterality: Left

## 2016-10-22 ENCOUNTER — Encounter (INDEPENDENT_AMBULATORY_CARE_PROVIDER_SITE_OTHER): Payer: Self-pay

## 2016-10-23 ENCOUNTER — Encounter (INDEPENDENT_AMBULATORY_CARE_PROVIDER_SITE_OTHER): Payer: Self-pay

## 2016-10-28 ENCOUNTER — Inpatient Hospital Stay
Admission: EM | Admit: 2016-10-28 | Discharge: 2016-10-30 | DRG: 291 | Disposition: A | Discharge status: Home Health | Payer: Medicare Other | Attending: Internal Medicine | Admitting: Internal Medicine

## 2016-10-28 ENCOUNTER — Emergency Department: Payer: Medicare Other

## 2016-10-28 ENCOUNTER — Other Ambulatory Visit: Payer: Self-pay

## 2016-10-28 ENCOUNTER — Encounter: Payer: Self-pay | Admitting: Emergency Medicine

## 2016-10-28 DIAGNOSIS — Z7901 Long term (current) use of anticoagulants: Secondary | ICD-10-CM | POA: Diagnosis not present

## 2016-10-28 DIAGNOSIS — I248 Other forms of acute ischemic heart disease: Secondary | ICD-10-CM | POA: Diagnosis present

## 2016-10-28 DIAGNOSIS — Z79891 Long term (current) use of opiate analgesic: Secondary | ICD-10-CM

## 2016-10-28 DIAGNOSIS — Z992 Dependence on renal dialysis: Secondary | ICD-10-CM | POA: Diagnosis not present

## 2016-10-28 DIAGNOSIS — E875 Hyperkalemia: Secondary | ICD-10-CM | POA: Diagnosis present

## 2016-10-28 DIAGNOSIS — D631 Anemia in chronic kidney disease: Secondary | ICD-10-CM | POA: Diagnosis present

## 2016-10-28 DIAGNOSIS — K219 Gastro-esophageal reflux disease without esophagitis: Secondary | ICD-10-CM | POA: Diagnosis present

## 2016-10-28 DIAGNOSIS — I447 Left bundle-branch block, unspecified: Secondary | ICD-10-CM | POA: Diagnosis present

## 2016-10-28 DIAGNOSIS — I2510 Atherosclerotic heart disease of native coronary artery without angina pectoris: Secondary | ICD-10-CM | POA: Diagnosis present

## 2016-10-28 DIAGNOSIS — J9601 Acute respiratory failure with hypoxia: Secondary | ICD-10-CM | POA: Diagnosis not present

## 2016-10-28 DIAGNOSIS — G4730 Sleep apnea, unspecified: Secondary | ICD-10-CM | POA: Diagnosis present

## 2016-10-28 DIAGNOSIS — N2581 Secondary hyperparathyroidism of renal origin: Secondary | ICD-10-CM | POA: Diagnosis present

## 2016-10-28 DIAGNOSIS — I5023 Acute on chronic systolic (congestive) heart failure: Secondary | ICD-10-CM | POA: Diagnosis present

## 2016-10-28 DIAGNOSIS — J90 Pleural effusion, not elsewhere classified: Secondary | ICD-10-CM

## 2016-10-28 DIAGNOSIS — F17210 Nicotine dependence, cigarettes, uncomplicated: Secondary | ICD-10-CM | POA: Diagnosis present

## 2016-10-28 DIAGNOSIS — J9621 Acute and chronic respiratory failure with hypoxia: Secondary | ICD-10-CM | POA: Diagnosis present

## 2016-10-28 DIAGNOSIS — J9600 Acute respiratory failure, unspecified whether with hypoxia or hypercapnia: Secondary | ICD-10-CM | POA: Diagnosis present

## 2016-10-28 DIAGNOSIS — I132 Hypertensive heart and chronic kidney disease with heart failure and with stage 5 chronic kidney disease, or end stage renal disease: Principal | ICD-10-CM | POA: Diagnosis present

## 2016-10-28 DIAGNOSIS — Z91048 Other nonmedicinal substance allergy status: Secondary | ICD-10-CM

## 2016-10-28 DIAGNOSIS — Z951 Presence of aortocoronary bypass graft: Secondary | ICD-10-CM

## 2016-10-28 DIAGNOSIS — I517 Cardiomegaly: Secondary | ICD-10-CM

## 2016-10-28 DIAGNOSIS — R0602 Shortness of breath: Secondary | ICD-10-CM

## 2016-10-28 DIAGNOSIS — I739 Peripheral vascular disease, unspecified: Secondary | ICD-10-CM | POA: Diagnosis present

## 2016-10-28 DIAGNOSIS — Z89411 Acquired absence of right great toe: Secondary | ICD-10-CM

## 2016-10-28 DIAGNOSIS — E785 Hyperlipidemia, unspecified: Secondary | ICD-10-CM | POA: Diagnosis present

## 2016-10-28 DIAGNOSIS — Z7951 Long term (current) use of inhaled steroids: Secondary | ICD-10-CM | POA: Diagnosis not present

## 2016-10-28 DIAGNOSIS — Z7982 Long term (current) use of aspirin: Secondary | ICD-10-CM | POA: Diagnosis not present

## 2016-10-28 DIAGNOSIS — I252 Old myocardial infarction: Secondary | ICD-10-CM

## 2016-10-28 DIAGNOSIS — Z8249 Family history of ischemic heart disease and other diseases of the circulatory system: Secondary | ICD-10-CM

## 2016-10-28 DIAGNOSIS — J810 Acute pulmonary edema: Secondary | ICD-10-CM

## 2016-10-28 DIAGNOSIS — Z23 Encounter for immunization: Secondary | ICD-10-CM

## 2016-10-28 DIAGNOSIS — Z89421 Acquired absence of other right toe(s): Secondary | ICD-10-CM

## 2016-10-28 DIAGNOSIS — I429 Cardiomyopathy, unspecified: Secondary | ICD-10-CM | POA: Diagnosis present

## 2016-10-28 DIAGNOSIS — J9690 Respiratory failure, unspecified, unspecified whether with hypoxia or hypercapnia: Secondary | ICD-10-CM

## 2016-10-28 DIAGNOSIS — Z89512 Acquired absence of left leg below knee: Secondary | ICD-10-CM

## 2016-10-28 DIAGNOSIS — N186 End stage renal disease: Secondary | ICD-10-CM | POA: Diagnosis not present

## 2016-10-28 DIAGNOSIS — J449 Chronic obstructive pulmonary disease, unspecified: Secondary | ICD-10-CM | POA: Diagnosis present

## 2016-10-28 DIAGNOSIS — Z79899 Other long term (current) drug therapy: Secondary | ICD-10-CM | POA: Diagnosis not present

## 2016-10-28 DIAGNOSIS — Z86711 Personal history of pulmonary embolism: Secondary | ICD-10-CM

## 2016-10-28 LAB — GLUCOSE, CAPILLARY
GLUCOSE-CAPILLARY: 49 mg/dL — AB (ref 65–99)
GLUCOSE-CAPILLARY: 57 mg/dL — AB (ref 65–99)
Glucose-Capillary: 95 mg/dL (ref 65–99)

## 2016-10-28 LAB — BASIC METABOLIC PANEL
ANION GAP: 12 (ref 5–15)
Anion gap: 18 — ABNORMAL HIGH (ref 5–15)
BUN: 128 mg/dL — ABNORMAL HIGH (ref 6–20)
BUN: 66 mg/dL — ABNORMAL HIGH (ref 6–20)
CALCIUM: 9.2 mg/dL (ref 8.9–10.3)
CHLORIDE: 98 mmol/L — AB (ref 101–111)
CO2: 20 mmol/L — AB (ref 22–32)
CO2: 27 mmol/L (ref 22–32)
CREATININE: 8.78 mg/dL — AB (ref 0.61–1.24)
Calcium: 10 mg/dL (ref 8.9–10.3)
Chloride: 96 mmol/L — ABNORMAL LOW (ref 101–111)
Creatinine, Ser: 13.8 mg/dL — ABNORMAL HIGH (ref 0.61–1.24)
GFR calc Af Amer: 4 mL/min — ABNORMAL LOW (ref >60)
GFR calc Af Amer: 7 mL/min — ABNORMAL LOW (ref >60)
GFR calc non Af Amer: 3 mL/min — ABNORMAL LOW (ref >60)
GFR, EST NON AFRICAN AMERICAN: 6 mL/min — AB (ref >60)
GLUCOSE: 85 mg/dL (ref 65–99)
Glucose, Bld: 122 mg/dL — ABNORMAL HIGH (ref 65–99)
POTASSIUM: 7.1 mmol/L — AB (ref 3.5–5.1)
Potassium: 5 mmol/L (ref 3.5–5.1)
SODIUM: 135 mmol/L (ref 135–145)
Sodium: 136 mmol/L (ref 135–145)

## 2016-10-28 LAB — CBC
HEMATOCRIT: 42.5 % (ref 40.0–52.0)
HEMOGLOBIN: 13.9 g/dL (ref 13.0–18.0)
MCH: 29.4 pg (ref 26.0–34.0)
MCHC: 32.7 g/dL (ref 32.0–36.0)
MCV: 90 fL (ref 80.0–100.0)
Platelets: 144 10*3/uL — ABNORMAL LOW (ref 150–440)
RBC: 4.72 MIL/uL (ref 4.40–5.90)
RDW: 15.5 % — AB (ref 11.5–14.5)
WBC: 7.5 10*3/uL (ref 3.8–10.6)

## 2016-10-28 LAB — MRSA PCR SCREENING: MRSA BY PCR: POSITIVE — AB

## 2016-10-28 LAB — TROPONIN I: Troponin I: 0.13 ng/mL (ref <0.03)

## 2016-10-28 LAB — PHOSPHORUS: Phosphorus: 6.7 mg/dL — ABNORMAL HIGH (ref 2.5–4.6)

## 2016-10-28 LAB — PROTIME-INR
INR: 3.38
Prothrombin Time: 35 seconds — ABNORMAL HIGH (ref 11.4–15.2)

## 2016-10-28 MED ORDER — ORAL CARE MOUTH RINSE: 15.0000 mL | Freq: Two times a day (BID) | OROMUCOSAL | Status: DC

## 2016-10-28 MED ORDER — ONDANSETRON HCL 4 MG/2ML IJ SOLN: 4.0000 mg | Freq: Four times a day (QID) | INTRAMUSCULAR | Status: DC | PRN

## 2016-10-28 MED ORDER — INSULIN ASPART 100 UNIT/ML ~~LOC~~ SOLN
10.0000 [IU] | Freq: Once | SUBCUTANEOUS | Status: AC
  Administered 2016-10-28: 10 [IU] via INTRAVENOUS
  Filled 2016-10-28: qty 10

## 2016-10-28 MED ORDER — ADULT MULTIVITAMIN W/MINERALS CH
1.0000 | ORAL_TABLET | Freq: Every day | ORAL | Status: DC
  Administered 2016-10-28 – 2016-10-29 (×2): 1 via ORAL
  Filled 2016-10-28 (×2): qty 1

## 2016-10-28 MED ORDER — FLUTICASONE PROPIONATE 50 MCG/ACT NA SUSP
1.0000 | Freq: Every day | NASAL | Status: DC
  Administered 2016-10-28 – 2016-10-29 (×2): 1 via NASAL
  Filled 2016-10-28: qty 16

## 2016-10-28 MED ORDER — SIMVASTATIN 10 MG PO TABS: 20.0000 mg | ORAL_TABLET | Freq: Every day | ORAL | Status: DC

## 2016-10-28 MED ORDER — ASPIRIN 325 MG PO TABS
325.0000 mg | ORAL_TABLET | ORAL | Status: DC
  Administered 2016-10-28 – 2016-10-30 (×3): 325 mg via ORAL
  Filled 2016-10-28 (×4): qty 1

## 2016-10-28 MED ORDER — NEPRO/CARBSTEADY PO LIQD: 237.0000 mL | Freq: Every day | ORAL | Status: DC

## 2016-10-28 MED ORDER — SODIUM CHLORIDE 0.9% FLUSH: 3.0000 mL | INTRAVENOUS | Status: DC | PRN

## 2016-10-28 MED ORDER — ALTEPLASE 2 MG IJ SOLR: 2.0000 mg | Freq: Once | INTRAMUSCULAR | Status: DC | PRN

## 2016-10-28 MED ORDER — ACETAMINOPHEN 650 MG RE SUPP: 650.0000 mg | Freq: Four times a day (QID) | RECTAL | Status: DC | PRN

## 2016-10-28 MED ORDER — SODIUM CHLORIDE 0.9 % IV SOLN: 100.0000 mL | INTRAVENOUS | Status: DC | PRN

## 2016-10-28 MED ORDER — ISOSORBIDE MONONITRATE ER 30 MG PO TB24: 30.0000 mg | ORAL_TABLET | Freq: Every day | ORAL | Status: DC

## 2016-10-28 MED ORDER — PENTAFLUOROPROP-TETRAFLUOROETH EX AERO
1.0000 "application " | INHALATION_SPRAY | CUTANEOUS | Status: DC | PRN
  Filled 2016-10-28: qty 30

## 2016-10-28 MED ORDER — PREGABALIN 75 MG PO CAPS
75.0000 mg | ORAL_CAPSULE | Freq: Two times a day (BID) | ORAL | Status: DC
  Administered 2016-10-29: 75 mg via ORAL
  Filled 2016-10-28 (×4): qty 1

## 2016-10-28 MED ORDER — PNEUMOCOCCAL VAC POLYVALENT 25 MCG/0.5ML IJ INJ
0.5000 mL | INJECTION | INTRAMUSCULAR | Status: AC
  Administered 2016-10-30: 0.5 mL via INTRAMUSCULAR
  Filled 2016-10-28: qty 0.5

## 2016-10-28 MED ORDER — RENA-VITE PO TABS
1.0000 | ORAL_TABLET | Freq: Every day | ORAL | Status: DC
  Administered 2016-10-28 – 2016-10-29 (×2): 1 via ORAL
  Filled 2016-10-28 (×2): qty 1

## 2016-10-28 MED ORDER — SODIUM CHLORIDE 0.9 % IV SOLN: 250.0000 mL | INTRAVENOUS | Status: DC | PRN

## 2016-10-28 MED ORDER — SENNOSIDES-DOCUSATE SODIUM 8.6-50 MG PO TABS: 1.0000 | ORAL_TABLET | Freq: Every evening | ORAL | Status: DC | PRN

## 2016-10-28 MED ORDER — HEPARIN SODIUM (PORCINE) 1000 UNIT/ML DIALYSIS
1000.0000 [IU] | INTRAMUSCULAR | Status: DC | PRN
  Filled 2016-10-28: qty 1

## 2016-10-28 MED ORDER — ACETAMINOPHEN 325 MG PO TABS: 650.0000 mg | ORAL_TABLET | Freq: Four times a day (QID) | ORAL | Status: DC | PRN

## 2016-10-28 MED ORDER — SODIUM CHLORIDE 0.9% FLUSH
3.0000 mL | Freq: Two times a day (BID) | INTRAVENOUS | Status: DC
  Administered 2016-10-28 – 2016-10-29 (×4): 3 mL via INTRAVENOUS

## 2016-10-28 MED ORDER — ALBUTEROL SULFATE (2.5 MG/3ML) 0.083% IN NEBU: 2.5000 mg | INHALATION_SOLUTION | RESPIRATORY_TRACT | Status: DC | PRN

## 2016-10-28 MED ORDER — LIDOCAINE-PRILOCAINE 2.5-2.5 % EX CREA
1.0000 "application " | TOPICAL_CREAM | CUTANEOUS | Status: DC | PRN
  Filled 2016-10-28: qty 5

## 2016-10-28 MED ORDER — MOMETASONE FURO-FORMOTEROL FUM 200-5 MCG/ACT IN AERO
2.0000 | INHALATION_SPRAY | Freq: Two times a day (BID) | RESPIRATORY_TRACT | Status: DC
  Administered 2016-10-28 – 2016-10-30 (×5): 2 via RESPIRATORY_TRACT
  Filled 2016-10-28: qty 8.8

## 2016-10-28 MED ORDER — OXYCODONE-ACETAMINOPHEN 7.5-325 MG PO TABS: 1.0000 | ORAL_TABLET | ORAL | Status: DC | PRN

## 2016-10-28 MED ORDER — ORAL CARE MOUTH RINSE
15.0000 mL | Freq: Two times a day (BID) | OROMUCOSAL | Status: DC
  Administered 2016-10-28: 15 mL via OROMUCOSAL

## 2016-10-28 MED ORDER — DEXTROSE 50 % IV SOLN
1.0000 | Freq: Once | INTRAVENOUS | Status: AC
  Administered 2016-10-28: 50 mL via INTRAVENOUS
  Filled 2016-10-28: qty 50

## 2016-10-28 MED ORDER — CALCIUM ACETATE (PHOS BINDER) 667 MG PO CAPS
667.0000 mg | ORAL_CAPSULE | Freq: Three times a day (TID) | ORAL | Status: DC
  Administered 2016-10-28 – 2016-10-30 (×5): 667 mg via ORAL
  Filled 2016-10-28 (×5): qty 1

## 2016-10-28 MED ORDER — CHLORHEXIDINE GLUCONATE 0.12 % MT SOLN
15.0000 mL | Freq: Two times a day (BID) | OROMUCOSAL | Status: DC
  Administered 2016-10-28: 15 mL via OROMUCOSAL
  Filled 2016-10-28: qty 15

## 2016-10-28 MED ORDER — DEXTROSE 50 % IV SOLN
INTRAVENOUS | Status: AC
  Filled 2016-10-28: qty 50

## 2016-10-28 MED ORDER — DOXERCALCIFEROL 4 MCG/2ML IV SOLN
2.5000 ug | INTRAVENOUS | Status: DC
  Administered 2016-10-28 – 2016-10-30 (×2): 2.5 ug via INTRAVENOUS
  Filled 2016-10-28 (×2): qty 2

## 2016-10-28 MED ORDER — LIDOCAINE HCL (PF) 1 % IJ SOLN
5.0000 mL | INTRAMUSCULAR | Status: DC | PRN
  Filled 2016-10-28: qty 5

## 2016-10-28 MED ORDER — BISACODYL 5 MG PO TBEC: 5.0000 mg | DELAYED_RELEASE_TABLET | Freq: Every day | ORAL | Status: DC | PRN

## 2016-10-28 MED ORDER — TIOTROPIUM BROMIDE MONOHYDRATE 18 MCG IN CAPS
18.0000 ug | ORAL_CAPSULE | Freq: Every day | RESPIRATORY_TRACT | Status: DC
  Administered 2016-10-28 – 2016-10-30 (×3): 18 ug via RESPIRATORY_TRACT
  Filled 2016-10-28: qty 5

## 2016-10-28 MED ORDER — SODIUM BICARBONATE 8.4 % IV SOLN
50.0000 meq | Freq: Once | INTRAVENOUS | Status: AC
  Administered 2016-10-28: 50 meq via INTRAVENOUS
  Filled 2016-10-28: qty 50

## 2016-10-28 MED ORDER — SODIUM POLYSTYRENE SULFONATE 15 GM/60ML PO SUSP
30.0000 g | Freq: Once | ORAL | Status: AC
  Administered 2016-10-28: 30 g via ORAL
  Filled 2016-10-28: qty 120

## 2016-10-28 MED ORDER — SODIUM CHLORIDE 0.9 % IV SOLN
1.0000 g | Freq: Once | INTRAVENOUS | Status: AC
  Administered 2016-10-28: 1 g via INTRAVENOUS
  Filled 2016-10-28: qty 10

## 2016-10-28 MED ORDER — SODIUM CHLORIDE 0.9 % IV SOLN: 125.0000 mg | INTRAVENOUS | Status: DC

## 2016-10-28 MED ORDER — NITROGLYCERIN 0.4 MG SL SUBL: 0.4000 mg | SUBLINGUAL_TABLET | SUBLINGUAL | Status: DC | PRN

## 2016-10-28 MED ORDER — DOCUSATE SODIUM 100 MG PO CAPS: 100.0000 mg | ORAL_CAPSULE | Freq: Two times a day (BID) | ORAL | Status: DC | PRN

## 2016-10-28 MED ORDER — MUPIROCIN 2 % EX OINT
1.0000 "application " | TOPICAL_OINTMENT | Freq: Two times a day (BID) | CUTANEOUS | Status: DC
  Administered 2016-10-28 – 2016-10-29 (×3): 1 via NASAL
  Filled 2016-10-28: qty 22

## 2016-10-28 MED ORDER — POLYETHYLENE GLYCOL 3350 17 G PO PACK
17.0000 g | PACK | ORAL | Status: DC
  Filled 2016-10-28: qty 1

## 2016-10-28 MED ORDER — ATORVASTATIN CALCIUM 10 MG PO TABS
10.0000 mg | ORAL_TABLET | Freq: Every day | ORAL | Status: DC
  Administered 2016-10-28 – 2016-10-29 (×2): 10 mg via ORAL
  Filled 2016-10-28 (×2): qty 1

## 2016-10-28 MED ORDER — PANTOPRAZOLE SODIUM 40 MG PO TBEC
40.0000 mg | DELAYED_RELEASE_TABLET | Freq: Every day | ORAL | Status: DC
  Administered 2016-10-28 – 2016-10-29 (×2): 40 mg via ORAL
  Filled 2016-10-28 (×2): qty 1

## 2016-10-28 MED ORDER — CHLORHEXIDINE GLUCONATE CLOTH 2 % EX PADS
6.0000 | MEDICATED_PAD | Freq: Every day | CUTANEOUS | Status: DC
  Administered 2016-10-29 – 2016-10-30 (×2): 6 via TOPICAL

## 2016-10-28 MED ORDER — SEVELAMER CARBONATE 800 MG PO TABS
800.0000 mg | ORAL_TABLET | Freq: Every day | ORAL | Status: DC
  Administered 2016-10-29: 800 mg via ORAL
  Filled 2016-10-28: qty 1

## 2016-10-28 MED ORDER — ONDANSETRON HCL 4 MG PO TABS: 4.0000 mg | ORAL_TABLET | Freq: Four times a day (QID) | ORAL | Status: DC | PRN

## 2016-10-28 NOTE — Consult Note (Signed)
PULMONARY/CCM  Requesting MD/Service: Bridgett Larsson Date of initial consultation: 10/28/16 Reason for consultation: Respiratory distress  PT PROFILE: 61 y.o. male smoker with cardiomyopathy, ESRD admitted via ED with shortness breath, severe hyperkalemia. BiPAP initiated in ED and patient admitted to SDU  HPI:  As above. His usual dialysis days are MWF. He has had cough for 2-3 days prior to admission with no significant sputum production or hemoptysis. He denies fever and chest pain. He denies lower extremity edema and calf tenderness.  Past Medical History:  Diagnosis Date  . 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 Amputee, below knee, left ([place])   . Anemia   . Asthma   . Cardiomyopathy ([place])   . CHF (congestive heart failure) ([place])   . Chronic systolic heart failure ([place])   . Complication of anesthesia    hypotension  . COPD (chronic obstructive pulmonary disease) ([place])   . Coronary artery disease   . Dialysis patient ([place])    Mon, Wed, Fri  . End stage renal disease ([place])   . GERD (gastroesophageal reflux disease)   . Headache   . History of kidney stones   . History of pulmonary embolism   . HLD (hyperlipidemia)   . HTN (hypertension)   . Hyperparathyroidism   . Myocardial infarction ([place])   . Peripheral vascular disease ([place])   . Shortness of breath dyspnea   . Sleep apnea    NO C-PAP, Patient stated in process of  "getting one"   . Tobacco dependence     Past Surgical History:  Procedure Laterality Date  . AMPUTATION Left 05/06/2014   Procedure: AMPUTATION BELOW KNEE;  Surgeon: Elam Dutch, MD;  Location: [facility];  Service: Vascular;  Laterality: Left;  . AMPUTATION Right 01/12/2015   Procedure: Foot transmetatarsal amputation;  Surgeon: Algernon Huxley, MD;  Location: [facility];  Service: Vascular;  Laterality: Right;  . APPLICATION OF WOUND VAC Right 03/01/2015   Procedure: Application of Bio-connekt graft and wound vac application to right foot ;  Surgeon:  Algernon Huxley, MD;  Location: [facility];  Service: Vascular;  Laterality: Right;  . AV FISTULA PLACEMENT Left   . CARDIAC CATHETERIZATION     stent placement   . CORONARY ANGIOPLASTY    . DIALYSIS/PERMA CATHETER INSERTION N/A 07/31/2016   Procedure: Dialysis/Perma Catheter Insertion;  Surgeon: Algernon Huxley, MD;  Location: [place] [facility];  Service: Cardiovascular;  Laterality: N/A;  . LIGATION OF ARTERIOVENOUS  FISTULA Right 01/31/2016   Procedure: LIGATION OF ARTERIOVENOUS  FISTULA;  Surgeon: Algernon Huxley, MD;  Location: [facility];  Service: Vascular;  Laterality: Right;  . PERIPHERAL VASCULAR CATHETERIZATION Right 12/15/2014   Procedure: Lower Extremity Angiography;  Surgeon: Algernon Huxley, MD;  Location: [place] [facility];  Service: Cardiovascular;  Laterality: Right;  . PERIPHERAL VASCULAR CATHETERIZATION  12/15/2014   Procedure: Lower Extremity Intervention;  Surgeon: Algernon Huxley, MD;  Location: [place] [facility];  Service: Cardiovascular;;  . PERIPHERAL VASCULAR CATHETERIZATION Right 08/14/2015   Procedure: A/V Shuntogram/Fistulagram;  Surgeon: Algernon Huxley, MD;  Location: [place] [facility];  Service: Cardiovascular;  Laterality: Right;  . PERIPHERAL VASCULAR CATHETERIZATION N/A 08/14/2015   Procedure: A/V Shunt Intervention;  Surgeon: Algernon Huxley, MD;  Location: [place] [facility];  Service: Cardiovascular;  Laterality: N/A;  . PERIPHERAL VASCULAR CATHETERIZATION N/A 01/11/2016   Procedure: Dialysis/Perma Catheter Insertion;  Surgeon: Algernon Huxley, MD;  Location: [place] INVASIVE CV  LAB;  Service: Cardiovascular;  Laterality: N/A;  . REVISON OF ARTERIOVENOUS FISTULA Right 02/17/2016   Procedure: removal of AV fistula;  Surgeon: Serafina Mitchell, MD;  Location: [facility];  Service: Vascular;  Laterality: Right;  . REVISON OF ARTERIOVENOUS FISTULA Right 01/31/2016   Procedure: REVISON OF ARTERIOVENOUS FISTULA ( BRACHIOCEPHALIC ) W/ ARTEGRAFT;  Surgeon: Algernon Huxley, MD;  Location: [facility];  Service:  Vascular;  Laterality: Right;  . TRANSMETATARSAL AMPUTATION Right 05/04/2015   Procedure: TRANSMETATARSAL AMPUTATION REVISION, great toe amputation;  Surgeon: Algernon Huxley, MD;  Location: [facility];  Service: Vascular;  Laterality: Right;    MEDICATIONS: I have reviewed all medications and confirmed regimen as documented  Social History   Social History  . Marital status: Widowed    Spouse name: N/A  . Number of children: N/A  . Years of education: N/A   Occupational History  . Not on file.   Social History Main Topics  . Smoking status: Light Tobacco Smoker    Packs/day: 0.25    Types: Cigarettes  . Smokeless tobacco: Never Used     Comment: 5  . Alcohol use No  . Drug use: No     Comment: has used crack cocaine in past   . Sexual activity: Not Currently    Birth control/ protection: Abstinence   Other Topics Concern  . Not on file   Social History Narrative   Engaged.     Family History  Problem Relation Age of Onset  . Heart failure Other   . Hypertension Other   . Leukemia Other   . Diabetes Other     ROS: No fever, myalgias/arthralgias, unexplained weight loss or weight gain No new focal weakness or sensory deficits No otalgia, hearing loss, visual changes, nasal and sinus symptoms, mouth and throat problems No neck pain or adenopathy No abdominal pain, N/V/D, diarrhea, change in bowel pattern No dysuria, change in urinary pattern   Vitals:   10/28/16 1415 10/28/16 1430 10/28/16 1445 10/28/16 1447  BP: 99/81 102/90 103/87   Pulse: 88 88  87  Resp: 16 17 11 14   Temp:    97.5 F (36.4 C)  TempSrc:    Oral  SpO2:    99%  Weight:      Height:         EXAM:  Gen: WDWN, Initially on BiPAP and well supported, subsequently comfortable on nasal cannula oxygen HEENT: NCAT, sclera white, oropharynx normal Neck: Supple without LAN, thyromegaly, JVD Lungs: breath sounds diminished throughout with no wheezes. Faint bibasilar crackles Cardiovascular:  Regular, no murmurs noted Abdomen: Soft, nontender, normal BS Ext: without clubbing, cyanosis, edema Neuro: CNs grossly intact, motor and sensory intact Skin: Limited exam, no lesions noted  DATA:   BMP Latest Ref Rng & Units 10/28/2016 09/25/2016 09/19/2016  Glucose 65 - 99 mg/dL 85 - 85  BUN 6 - 20 mg/dL 128(H) - 78(H)  Creatinine 0.61 - 1.24 mg/dL 13.80(H) - 9.44(H)  Sodium 135 - 145 mmol/L 136 - 131(L)  Potassium 3.5 - 5.1 mmol/L 7.1(HH) 6.4(HH) 6.2(H)  Chloride 101 - 111 mmol/L 98(L) - 94(L)  CO2 22 - 32 mmol/L 20(L) - 21(L)  Calcium 8.9 - 10.3 mg/dL 10.0 - 9.7    CBC Latest Ref Rng & Units 10/28/2016 09/19/2016 08/21/2016  WBC 3.8 - 10.6 K/uL 7.5 6.3 7.0  Hemoglobin 13.0 - 18.0 g/dL 13.9 11.8(L) 10.3(L)  Hematocrit 40.0 - 52.0 % 42.5 35.9(L) 30.8(L)  Platelets 150 - 440  K/uL 144(L) 173 174    CXR:  Severe cardiomegaly, right lower lobe opacity/effusion which was present previously but appears slightly worse on current film  EKG: Left bundle branch block (chronic)  IMPRESSION:     ICD-9-CM ICD-10-CM   1. SOB (shortness of breath) 786.05 R06.02   2. Pleural effusion 511.9 J90   3. Acute pulmonary edema (HCC) 518.4 J81.0   4. Hyperkalemia 276.7 E87.5   5.      Mildly elevated trop I of unclear significance  PLAN:  When necessary BiPAP Emergent dialysis Recheck chest x-ray on day following admission Echocardiogram Consider cardiology evaluation (followed by Dr. Nehemiah Massed)  Merton Border, MD PCCM service Mobile [phone] Pager [phone] 10/28/2016

## 2016-10-28 NOTE — Progress Notes (Signed)
[place] for warfarin management  Indication: recent history of DVT/PE; patient on warfarin as an outpatient.    Pharmacy consulted for warfarin management for 61 yo male admitted with acute respiratory failure, goal 2-3. Patient has regularly scheduled dialysis Monday/Wednesday/Friday.   Goal:  INR 2-3    Plan:   INR elevated this morning. Will hold dose and recheck INR with am labs. Will plan on resuming warfarin at 4mg  when INR is in range.   Allergies  Allergen Reactions  . Dust Mite Extract Other (See Comments)    Reaction: unknown    Patient Measurements: Height: 6\' 2"  (188 cm) Weight: 195 lb 5.2 oz (88.6 kg) IBW/kg (Calculated) : 82.2  Vital Signs: Temp: 97.5 F (36.4 C) (06/04 1200) Temp Source: Oral (06/04 1200) BP: 107/87 (06/04 1345) Pulse Rate: 87 (06/04 1330)  Labs:  Recent Labs  10/28/16 0553 10/28/16 0554  HGB 13.9  --   HCT 42.5  --   PLT 144*  --   LABPROT  --  35.0*  INR  --  3.38  CREATININE 13.80*  --   TROPONINI 0.13*  --     Estimated Creatinine Clearance: 6.5 mL/min (A) (by C-G formula based on SCr of 13.8 mg/dL (H)).   Pharmacy will continue to monitor and adjust per consult.   Mellonie Guess L 10/28/2016,2:22 PM

## 2016-10-28 NOTE — ED Provider Notes (Signed)
[facility] Emergency Department Provider Note   ____________________________________________   First MD Initiated Contact with Patient 10/28/16 [phone]     (approximate)  I have reviewed the triage vital signs and the nursing notes.   HISTORY  Chief Complaint Shortness of Breath    HPI [name] is a 61 y.o. male who comes into the hospital today with some difficulty breathing. It's been going on for about one day. The patient has a history of end-stage renal disease on dialysis. He did receive dialysis on Friday and was due for dialysis this morning. He also had a recent diagnosis of pneumonia but reports he did not receive any antibiotics. According to EMS his lung sounds were diminished throughout. They gave him a DuoNeb treatment and he reports that while at [facility] feels better but soon as a taken off he feels short of breath again. The patient's blood pressure was 130/100 and his EKG showed sinus rhythm with a left bundle branch block. The patient's O2 sats were 92% on room air. The patient denies any chest pain nausea or vomiting. His kidney doctor is in [place]. The patient is here today for evaluation. He denies any cough or fevers.   Past Medical History:  Diagnosis Date  . 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 Amputee, below knee, left ([place])   . Anemia   . Asthma   . Cardiomyopathy ([place])   . CHF (congestive heart failure) ([place])   . Chronic systolic heart failure ([place])   . Complication of anesthesia    hypotension  . COPD (chronic obstructive pulmonary disease) ([place])   . Coronary artery disease   . Dialysis patient ([place])    Mon, Wed, Fri  . End stage renal disease ([place])   . GERD (gastroesophageal reflux disease)   . Headache   . History of kidney stones   . History of pulmonary embolism   . HLD (hyperlipidemia)   . HTN (hypertension)   . Hyperparathyroidism   . Myocardial infarction ([place])   . Peripheral vascular  disease ([place])   . Shortness of breath dyspnea   . Sleep apnea    NO C-PAP, Patient stated in process of  "getting one"   . Tobacco dependence     Patient Active Problem List   Diagnosis Date Noted  . Acute respiratory failure ([place]) 10/28/2016  . Pulmonary embolism ([place]) 08/03/2016  . NSTEMI (non-ST elevated myocardial infarction) ([place]) 07/26/2016  . Acute respiratory failure with hypoxia ([place]) 05/24/2016  . Right lower lobe pneumonia ([place]) 05/24/2016  . Elevated troponin 05/24/2016  . Pulmonary edema 05/21/2016  . Kidney dialysis as the cause of abnormal reaction of the patient, or of later complication, without mention of misadventure at the time of the procedure (CODE) 03/05/2016  . PVD (peripheral vascular disease) ([place]) 03/05/2016  . Preoperative cardiovascular examination 05/02/2015  . Chronic systolic heart failure ([place])   . Sepsis ([place]) 01/13/2015  . Acute encephalopathy 01/13/2015  . Hyperkalemia 01/13/2015  . Gangrene of foot ([place]) 05/04/2014  . ESRD (end stage renal disease) on dialysis ([place]) 05/04/2014  . CAD (coronary artery disease) 05/04/2014  . Normocytic anemia 05/04/2014  . Hyperlipidemia 04/26/2010  . HYPERTENSION, BENIGN 04/26/2010  . CAD, NATIVE VESSEL 04/26/2010  . End stage renal disease ([place]) 04/26/2010    Past Surgical History:  Procedure Laterality Date  . AMPUTATION Left 05/06/2014   Procedure: AMPUTATION BELOW KNEE;  Surgeon: Elam Dutch, MD;  Location:  [facility];  Service: Vascular;  Laterality: Left;  . AMPUTATION Right 01/12/2015   Procedure: Foot transmetatarsal amputation;  Surgeon: Algernon Huxley, MD;  Location: [facility] ORS;  Service: Vascular;  Laterality: Right;  . APPLICATION OF WOUND VAC Right 03/01/2015   Procedure: Application of Bio-connekt graft and wound vac application to right foot ;  Surgeon: Algernon Huxley, MD;  Location: [facility] ORS;  Service: Vascular;  Laterality: Right;  . AV FISTULA PLACEMENT Left   . CARDIAC CATHETERIZATION     stent  placement   . CORONARY ANGIOPLASTY    . DIALYSIS/PERMA CATHETER INSERTION N/A 07/31/2016   Procedure: Dialysis/Perma Catheter Insertion;  Surgeon: Algernon Huxley, MD;  Location: [facility];  Service: Cardiovascular;  Laterality: N/A;  . LIGATION OF ARTERIOVENOUS  FISTULA Right 01/31/2016   Procedure: LIGATION OF ARTERIOVENOUS  FISTULA;  Surgeon: Algernon Huxley, MD;  Location: [facility];  Service: Vascular;  Laterality: Right;  . PERIPHERAL VASCULAR CATHETERIZATION Right 12/15/2014   Procedure: Lower Extremity Angiography;  Surgeon: Algernon Huxley, MD;  Location: [facility];  Service: Cardiovascular;  Laterality: Right;  . PERIPHERAL VASCULAR CATHETERIZATION  12/15/2014   Procedure: Lower Extremity Intervention;  Surgeon: Algernon Huxley, MD;  Location: [facility];  Service: Cardiovascular;;  . PERIPHERAL VASCULAR CATHETERIZATION Right 08/14/2015   Procedure: A/V Shuntogram/Fistulagram;  Surgeon: Algernon Huxley, MD;  Location: [facility];  Service: Cardiovascular;  Laterality: Right;  . PERIPHERAL VASCULAR CATHETERIZATION N/A 08/14/2015   Procedure: A/V Shunt Intervention;  Surgeon: Algernon Huxley, MD;  Location: [facility];  Service: Cardiovascular;  Laterality: N/A;  . PERIPHERAL VASCULAR CATHETERIZATION N/A 01/11/2016   Procedure: Dialysis/Perma Catheter Insertion;  Surgeon: Algernon Huxley, MD;  Location: [facility];  Service: Cardiovascular;  Laterality: N/A;  . REVISON OF ARTERIOVENOUS FISTULA Right 02/17/2016   Procedure: removal of AV fistula;  Surgeon: Serafina Mitchell, MD;  Location: [facility];  Service: Vascular;  Laterality: Right;  . REVISON OF ARTERIOVENOUS FISTULA Right 01/31/2016   Procedure: REVISON OF ARTERIOVENOUS FISTULA ( BRACHIOCEPHALIC ) W/ ARTEGRAFT;  Surgeon: Algernon Huxley, MD;  Location: [facility];  Service: Vascular;  Laterality: Right;  . TRANSMETATARSAL AMPUTATION Right 05/04/2015   Procedure: TRANSMETATARSAL AMPUTATION REVISION, great toe  amputation;  Surgeon: Algernon Huxley, MD;  Location: [facility];  Service: Vascular;  Laterality: Right;    Prior to Admission medications   Medication Sig Start Date End Date Taking? Authorizing Provider  acetaminophen (TYLENOL) 500 MG tablet Take 1,000 mg by mouth every 6 (six) hours as needed for mild pain.     [provider]  aspirin 325 MG tablet Take 325 mg by mouth every morning.     [provider]  atorvastatin (LIPITOR) 10 MG tablet Take 10 mg by mouth at bedtime.     [provider]  budesonide-formoterol (SYMBICORT) 160-4.5 MCG/ACT inhaler Inhale 2 puffs into the lungs 2 (two) times daily. Reported on 08/14/2015    [provider]  calcium acetate (PHOSLO) 667 MG capsule Take 667 mg by mouth 3 (three) times daily with meals.    [provider]  Darbepoetin Alfa (ARANESP) 40 MCG/0.4ML SOSY injection Inject 0.4 mLs (40 mcg total) into the vein every Monday with hemodialysis. 05/16/14   Ghimire, Henreitta Leber, MD  docusate sodium (COLACE) 100 MG capsule Take 100 mg by mouth 2 (two) times daily as needed for mild constipation. Reported on 08/14/2015  [provider]  doxercalciferol (HECTOROL) 4 MCG/2ML injection Inject 1.25 mLs (2.5 mcg total) into the vein every Monday, Wednesday, and Friday with hemodialysis. 05/10/14   Ghimire, Henreitta Leber, MD  ferric gluconate 125 mg in sodium chloride 0.9 % 100 mL Inject 125 mg into the vein every Monday, Wednesday, and Friday with hemodialysis. 05/10/14   Ghimire, Henreitta Leber, MD  fluticasone (FLONASE) 50 MCG/ACT nasal spray Place 1 spray into both nostrils daily.    [provider]  Ipratropium-Albuterol (COMBIVENT IN) Inhale 2 puffs into the lungs every 6 (six) hours as needed (for wheezing/shortness of breath). COMBIVENT AEROSOL 18-103 MCG/ACT    [provider]  ipratropium-albuterol (DUONEB) 0.5-2.5 (3) MG/3ML SOLN Take 3 mLs by nebulization every 4 (four) hours as needed (SOB).     [provider]  isosorbide mononitrate (IMDUR) 30 MG 24 hr tablet Take 1 tablet (30 mg total) by mouth daily. 08/22/16   Demetrios Loll, MD  Multiple Vitamins-Minerals (MULTIVITAMIN WITH MINERALS) tablet Take 1 tablet by mouth daily.    [provider]  multivitamin (RENA-VIT) TABS tablet Take 1 tablet by mouth at bedtime. 05/10/14   Ghimire, Henreitta Leber, MD  nitroGLYCERIN (NITROSTAT) 0.4 MG SL tablet Place 0.4 mg under the tongue every 5 (five) minutes as needed for chest pain.    [provider]  Nutritional Supplements (FEEDING SUPPLEMENT, NEPRO CARB STEADY,) LIQD Take 237 mLs by mouth daily.    [provider]  oxyCODONE (OXY IR/ROXICODONE) 5 MG immediate release tablet Take 5 mg by mouth every 8 (eight) hours as needed for severe pain.    [provider]  oxyCODONE-acetaminophen (PERCOCET) 7.5-325 MG tablet Take 1 tablet by mouth every 4 (four) hours as needed for severe pain. 07/31/16   Loletha Grayer, MD  pantoprazole (PROTONIX) 20 MG tablet Take 20 mg by mouth daily.    [provider]  polyethylene glycol (MIRALAX / GLYCOLAX) packet Take 17 g by mouth every morning. Hold for loose stools.    [provider]  pregabalin (LYRICA) 75 MG capsule Take 75 mg by mouth 2 (two) times daily.    [provider]  sevelamer carbonate (RENVELA) 800 MG tablet Take 800 mg by mouth daily.     [provider]  simvastatin (ZOCOR) 20 MG tablet Take 20 mg by mouth daily.    [provider]  tiotropium (SPIRIVA) 18 MCG inhalation capsule Place 18 mcg into inhaler and inhale daily.    [provider]  Vancomycin Zollie Pee) 750-5 MG/150ML-% SOLN With each dialysis, Tuesday, Thursday and Saturday. Last dose April 3rd Patient taking differently: Inject 750 mg into the vein every Monday, Wednesday, and Friday with hemodialysis. With each dialysis on Monday, Wednesday, Friday 07/31/16   Loletha Grayer, MD  warfarin (COUMADIN)  5 MG tablet Take 1 tablet (5 mg total) by mouth daily at 6 PM. Patient not taking: Reported on 09/04/2016 08/07/16   Fritzi Mandes, MD  warfarin (COUMADIN) 6 MG tablet Take 6 mg by mouth daily at 6 PM.    [provider]    Allergies Dust mite extract  Family History  Problem Relation Age of Onset  . Heart failure Other   . Hypertension Other   . Leukemia Other   . Diabetes Other     Social History Social History  Substance Use Topics  . Smoking status: Light Tobacco Smoker    Packs/day: 0.25    Types: Cigarettes  . Smokeless tobacco: Never Used  Comment: 5  . Alcohol use No    Review of Systems  Constitutional: No fever/chills Eyes: No visual changes. ENT: No sore throat. Cardiovascular: Denies chest pain. Respiratory: shortness of breath. Gastrointestinal: No abdominal pain.  No nausea, no vomiting.  No diarrhea.  No constipation. Genitourinary: Negative for dysuria. Musculoskeletal: Negative for back pain. Skin: Negative for rash. Neurological: Negative for headaches, focal weakness or numbness.   ____________________________________________   PHYSICAL EXAM:  VITAL SIGNS: ED Triage Vitals  Enc Vitals Group     BP 10/28/16 0547 (!) 126/105     Pulse Rate 10/28/16 0547 95     Resp 10/28/16 0547 (!) 25     Temp 10/28/16 0547 97.7 F (36.5 C)     Temp Source 10/28/16 0547 Oral     SpO2 10/28/16 0544 92 %     Weight 10/28/16 0551 196 lb 13.9 oz (89.3 kg)     Height 10/28/16 0551 5\' 7"  (1.702 m)     Head Circumference --      Peak Flow --      Pain Score --      Pain Loc --      Pain Edu? --      Excl. in [place]? --     Constitutional: Alert and oriented. Well appearing and in Mild to moderate distress. Eyes: Conjunctivae are normal. PERRL. EOMI. Head: Atraumatic. Nose: No congestion/rhinnorhea. Mouth/Throat: Mucous membranes are moist.  Oropharynx non-erythematous. Cardiovascular: Normal rate, regular rhythm. Grossly normal heart sounds.  Good  peripheral circulation. Respiratory: Increased respiratory effort.  No retractions. Crackles at bilateral bases Gastrointestinal: Soft and nontender. No distention. Positive bowel sounds Musculoskeletal: No lower extremity tenderness nor edema.  Neurologic:  Normal speech and language.  Skin:  Skin is warm, dry and intact.  Psychiatric: Mood and affect are normal.   ____________________________________________   LABS (all labs ordered are listed, but only abnormal results are displayed)  Labs Reviewed  CBC - Abnormal; Notable for the following:       Result Value   RDW 15.5 (*)    Platelets 144 (*)    All other components within normal limits  TROPONIN I - Abnormal; Notable for the following:    Troponin I 0.13 (*)    All other components within normal limits  BASIC METABOLIC PANEL - Abnormal; Notable for the following:    Potassium 7.1 (*)    Chloride 98 (*)    CO2 20 (*)    BUN 128 (*)    Creatinine, Ser 13.80 (*)    GFR calc non Af Amer 3 (*)    GFR calc Af Amer 4 (*)    Anion gap 18 (*)    All other components within normal limits  BLOOD GAS, VENOUS - Abnormal; Notable for the following:    Acid-base deficit 6.1 (*)    All other components within normal limits   ____________________________________________  EKG  ED ECG REPORT I, Loney Hering, the attending physician, personally viewed and interpreted this ECG.   Date: 10/28/2016  EKG Time: 546  Rate: 94  Rhythm: normal sinus rhythm, LBBB  Axis: left axis deviation  Intervals:none  ST&T Change: none  ____________________________________________  RADIOLOGY  Dg Chest Portable 1 View  Result Date: 10/28/2016 CLINICAL DATA:  Sudden onset of shortness of breath this morning. EXAM: PORTABLE CHEST 1 VIEW COMPARISON:  Most recent comparison radiographs 09/19/2016. Chest CT 08/03/2016 FINDINGS: Left-sided dialysis catheter tip remains at the atrial caval junction. Progressive cardiomegaly  from prior exam.  Small to moderate pleural effusion and basilar opacity, possible increase from prior versus differences in technique. Pulmonary edema appears similar. No new airspace disease. Vascular stent in the right axilla. IMPRESSION: Progressive cardiomegaly. Right pleural effusion with equivocal increase in size from prior. Pulmonary edema appears similar. Suspect CHF exacerbation. Electronically Signed   By: Jeb Levering M.D.   On: 10/28/2016 06:16    ____________________________________________   PROCEDURES  Procedure(s) performed: None  Procedures  Critical Care performed: Yes, see critical care note(s)  CRITICAL CARE Performed by: Charlesetta Ivory P   Total critical care time: 30 minutes  Critical care time was exclusive of separately billable procedures and treating other patients.  Critical care was necessary to treat or prevent imminent or life-threatening deterioration.  Critical care was time spent personally by me on the following activities: development of treatment plan with patient and/or surrogate as well as nursing, discussions with consultants, evaluation of patient's response to treatment, examination of patient, obtaining history from patient or surrogate, ordering and performing treatments and interventions, ordering and review of laboratory studies, ordering and review of radiographic studies, pulse oximetry and re-evaluation of patient's condition.  ____________________________________________   INITIAL IMPRESSION / ASSESSMENT AND PLAN / ED COURSE  Pertinent labs & imaging results that were available during my care of the patient were reviewed by me and considered in my medical decision making (see chart for details).  This is a 61 year old male who comes into the hospital today with shortness of breath. The patient is due for dialysis today and likely has some pulmonary edema. The patient was tachypnea, we took off his breathing treatments I did place him on BiPAP.  He will receive an x-ray and some blood work and I will reassess the patient once I received those results.  Clinical Course as of Oct 29 719  Mon Oct 28, 2016  8115 Progressive cardiomegaly. Right pleural effusion with equivocal increase in size from prior. Pulmonary edema appears similar. Suspect CHF exacerbation.   DG Chest Portable 1 View [AW]    Clinical Course User Index [AW] Loney Hering, MD   It was discovered that the patient has a potassium of 7.1. He will receive some sodium bicarbonate, dextrose, insulin and calcium gluconate and Kayexalate. I contacted Dr. Holley Raring the nephrologist to set up dialysis for the patient. I will admit the patient to the hospitalist service. The patient at this time has no other complaints.  ____________________________________________   FINAL CLINICAL IMPRESSION(S) / ED DIAGNOSES  Final diagnoses:  SOB (shortness of breath)  Pleural effusion  Acute pulmonary edema (HCC)  Hyperkalemia      NEW MEDICATIONS STARTED DURING THIS VISIT:  New Prescriptions   No medications on file     Note:  This document was prepared using Dragon voice recognition software and may include unintentional dictation errors.    Loney Hering, MD 10/28/16 [phone]

## 2016-10-28 NOTE — ED Notes (Signed)
PT paged to bedside for BiPap

## 2016-10-28 NOTE — Care Management (Signed)
Per patient's history he is a long-term care resident at [facility] health care. CSW updated.

## 2016-10-28 NOTE — ED Notes (Signed)
XR at bedside

## 2016-10-28 NOTE — ED Triage Notes (Signed)
Patient arrived from home via EMS with c/o SOB.  Pt arrived on non-rebreather at 15L.  He has MWF dialysis and did go to last Friday's tx.  Pt presents with increased WOB and MD was at bedside during triage and ordered BiPap.  Pt has no other complaints.

## 2016-10-28 NOTE — Progress Notes (Signed)
Orders placed to transfer pt to any medsurg unit with off unit telemetry.  Report given to hospitalist Dr. Jannifer [place] informed MD pt is alert and oriented, in no respiratory distress, off bipap and on 3L O2 via nasal canula post emergent hemodialysis.  PCCM will sign off once pt is physically moved out of ICU.    Marda Stalker, [place] Pager [phone] (please enter 7 digits) PCCM Consult Pager [phone] (please enter 7 digits)

## 2016-10-28 NOTE — Progress Notes (Signed)
[facility]  ROUNDING NOTE   Subjective:  Patient well known to [place] from prior admissions. He was due for dialysis today. However he presents now with shortness of breath. He denies high fluid intake over the weekend. He is currently on BiPAP and remains short of breath. Potassium was also quite high at 7.1. We are planning for dialysis shortly.   Objective:  Vital signs in last 24 hours:  Temp:  [97.7 F (36.5 C)-97.8 F (36.6 C)] 97.8 F (36.6 C) (06/04 0900) Pulse Rate:  [91-95] 91 (06/04 0630) Resp:  [17-25] 17 (06/04 0630) BP: (120-129)/(98-107) 129/98 (06/04 0630) SpO2:  [92 %-100 %] 100 % (06/04 0630) FiO2 (%):  [40 %] 40 % (06/04 0900) Weight:  [88.6 kg (195 lb 5.2 oz)-89.3 kg (196 lb 13.9 oz)] 88.6 kg (195 lb 5.2 oz) (06/04 0900)  Weight change:  Filed Weights   10/28/16 0551 10/28/16 0900  Weight: 89.3 kg (196 lb 13.9 oz) 88.6 kg (195 lb 5.2 oz)    Intake/Output: No intake/output data recorded.   Intake/Output this shift:  Total I/O In: 240 [P.O.:240] Out: 0   Physical Exam: General: Critically ill appearing   Head: Normocephalic, atraumatic. Moist oral mucosal membranes  Eyes: Anicteric  Neck: Supple, trachea midline  Lungs:  Bilateral rhonchi, currently on BiPAP   Heart: S1S2 no rubs  Abdomen:  Soft, nontender, bowel sounds present  Extremities: Left BKA, right transmetatarsal amputation   Neurologic: Awake, alert, following commands  Skin: No lesions  Access: Left IJ PermCath     Basic Metabolic Panel:  Recent Labs Lab 10/28/16 0553  NA 136  K 7.1*  CL 98*  CO2 20*  GLUCOSE 85  BUN 128*  CREATININE 13.80*  CALCIUM 10.0    Liver Function Tests: No results for input(s): AST, ALT, ALKPHOS, BILITOT, PROT, ALBUMIN in the last 168 hours. No results for input(s): LIPASE, AMYLASE in the last 168 hours. No results for input(s): AMMONIA in the last 168 hours.  CBC:  Recent Labs Lab 10/28/16 0553  WBC 7.5  HGB 13.9  HCT  42.5  MCV 90.0  PLT 144*    Cardiac Enzymes:  Recent Labs Lab 10/28/16 0553  TROPONINI 0.13*    BNP: Invalid input(s): POCBNP  CBG:  Recent Labs Lab 10/28/16 0847 10/28/16 0850 10/28/16 0922  GLUCAP 73* 35* 95    Microbiology: Results for orders placed or performed during the hospital encounter of 09/19/16  Surgical pcr screen     Status: None   Collection Time: 09/19/16 12:40 PM  Result Value Ref Range Status   MRSA, PCR NEGATIVE NEGATIVE Final   Staphylococcus aureus NEGATIVE NEGATIVE Final    Comment:        The Xpert SA Assay (FDA approved for NASAL specimens in patients over 80 years of age), is one component of a comprehensive surveillance program.  Test performance has been validated by [facility] for patients greater than or equal to 74 year old. It is not intended to diagnose infection nor to guide or monitor treatment.     Coagulation Studies:  Recent Labs  10/28/16 0554  LABPROT 35.0*  INR 3.38    Urinalysis: No results for input(s): COLORURINE, LABSPEC, PHURINE, GLUCOSEU, HGBUR, BILIRUBINUR, KETONESUR, PROTEINUR, UROBILINOGEN, NITRITE, LEUKOCYTESUR in the last 72 hours.  Invalid input(s): APPERANCEUR    Imaging: Dg Chest Portable 1 View  Result Date: 10/28/2016 CLINICAL DATA:  Sudden onset of shortness of breath this morning. EXAM: PORTABLE CHEST 1 VIEW COMPARISON:  Most recent comparison radiographs 09/19/2016. Chest CT 08/03/2016 FINDINGS: Left-sided dialysis catheter tip remains at the atrial caval junction. Progressive cardiomegaly from prior exam. Small to moderate pleural effusion and basilar opacity, possible increase from prior versus differences in technique. Pulmonary edema appears similar. No new airspace disease. Vascular stent in the right axilla. IMPRESSION: Progressive cardiomegaly. Right pleural effusion with equivocal increase in size from prior. Pulmonary edema appears similar. Suspect CHF exacerbation. Electronically  Signed   By: Jeb Levering M.D.   On: 10/28/2016 06:16     Medications:   . sodium chloride     . aspirin  325 mg Oral BH-q7a  . atorvastatin  10 mg Oral QHS  . calcium acetate  667 mg Oral TID WC  . chlorhexidine  15 mL Mouth Rinse BID  . doxercalciferol  2.5 mcg Intravenous Q M,W,F-HD  . feeding supplement (NEPRO CARB STEADY)  237 mL Oral Daily  . fluticasone  1 spray Each Nare Daily  . isosorbide mononitrate  30 mg Oral Daily  . mouth rinse  15 mL Mouth Rinse q12n4p  . mometasone-formoterol  2 puff Inhalation BID  . multivitamin  1 tablet Oral QHS  . multivitamin with minerals  1 tablet Oral Daily  . pantoprazole  40 mg Oral Daily  . [START ON 10/29/2016] pneumococcal 23 valent vaccine  0.5 mL Intramuscular Tomorrow-1000  . polyethylene glycol  17 g Oral BH-q7a  . pregabalin  75 mg Oral BID  . sevelamer carbonate  800 mg Oral Q lunch  . sodium chloride flush  3 mL Intravenous Q12H  . tiotropium  18 mcg Inhalation Daily   sodium chloride, acetaminophen **OR** acetaminophen, albuterol, bisacodyl, docusate sodium, nitroGLYCERIN, ondansetron **OR** ondansetron (ZOFRAN) IV, oxyCODONE-acetaminophen, senna-docusate, sodium chloride flush  Assessment/ Plan:  61 y.o. male with ESRD on hemodialysis, hypertension, hyperlipidemia, anemia, COPD, peripheral vascular disease, left BKA, right toe amputations, Pulm Embolism 07/2016 requiring Warfarin  MWF [facility].   1. End stage renal disease -  With hyperkalemia - patient needs urgent hemodialysis treatment today given shortness of breath as well as hyperkalemia. We will reassess the patient for dialysis tomorrow as well.  2. Anemia of chronic kidney disease: Hemoglobin currently quite high at 13.9. Hold off on Epogen.  3.   acute respiratory failure. Patient currently requiring BiPAP. We will be planning for dialysis with ultrafiltration target of 3 kg at the moment. Reassess for ultrafiltration tomorrow as  well.  4. Secondary Hyperparathyroidism:  Check intact PTH and phosphorus with dialysis today. Okay to continue Renvela for now.   LOS: 0 [name] 6/4/201810:45 AM

## 2016-10-28 NOTE — Progress Notes (Addendum)
Hemodialysis in room initiated  CBG increased to 95 after pt drank 8 ozs cranberry juice.  MD Simonds informed pt cannot tolerate anything pushed through either PIV.  We will not recheck K at this time, recheck after HD.  Try to maintain patient with PO intake at this time.  No further access attempted  Attempted to reach patient's sister Lucita Ferrara to find out the name of his Care Home ( he just moved there on Friday) and complete his home medication list, left VM

## 2016-10-28 NOTE — H&P (Addendum)
[facility] NAME: [name]    MR#:  [id number]  DATE OF BIRTH:  [birth year]  DATE OF ADMISSION:  10/28/2016  PRIMARY CARE PHYSICIAN: [facility] [place]   REQUESTING/REFERRING PHYSICIAN: Loney Hering, MD  CHIEF COMPLAINT:   Chief Complaint  Patient presents with  . Shortness of Breath   Shortness breath for 2-3 days. HISTORY OF PRESENT ILLNESS:  [name]  is a 61 y.o. male with a known history of ESRD, PVD, CHF, cardiomyopathy, CAD and COPD. The patient has had shortness breath and cough for 2-3 days. He has ESRD and got hemodialysis last Friday. He denies any fever or chills, no palpitation, chest pain or leg edema. He was found hypoxia and put on BiPAP. His potassium is 7.1. Chest x-ray show pleural effusion and pulmonary edema. Nephrologist was called for emergent hemodialysis. PAST MEDICAL HISTORY:   Past Medical History:  Diagnosis Date  . 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 Amputee, below knee, left ([place])   . Anemia   . Asthma   . Cardiomyopathy ([place])   . CHF (congestive heart failure) ([place])   . Chronic systolic heart failure ([place])   . Complication of anesthesia    hypotension  . COPD (chronic obstructive pulmonary disease) ([place])   . Coronary artery disease   . Dialysis patient ([place])    Mon, Wed, Fri  . End stage renal disease ([place])   . GERD (gastroesophageal reflux disease)   . Headache   . History of kidney stones   . History of pulmonary embolism   . HLD (hyperlipidemia)   . HTN (hypertension)   . Hyperparathyroidism   . Myocardial infarction ([place])   . Peripheral vascular disease ([place])   . Shortness of breath dyspnea   . Sleep apnea    NO C-PAP, Patient stated in process of  "getting one"   . Tobacco dependence     PAST SURGICAL HISTORY:   Past Surgical History:  Procedure Laterality Date  . AMPUTATION Left 05/06/2014   Procedure: AMPUTATION  BELOW KNEE;  Surgeon: Elam Dutch, MD;  Location: [facility];  Service: Vascular;  Laterality: Left;  . AMPUTATION Right 01/12/2015   Procedure: Foot transmetatarsal amputation;  Surgeon: Algernon Huxley, MD;  Location: [facility];  Service: Vascular;  Laterality: Right;  . APPLICATION OF WOUND VAC Right 03/01/2015   Procedure: Application of Bio-connekt graft and wound vac application to right foot ;  Surgeon: Algernon Huxley, MD;  Location: [facility];  Service: Vascular;  Laterality: Right;  . AV FISTULA PLACEMENT Left   . CARDIAC CATHETERIZATION     stent placement   . CORONARY ANGIOPLASTY    . DIALYSIS/PERMA CATHETER INSERTION N/A 07/31/2016   Procedure: Dialysis/Perma Catheter Insertion;  Surgeon: Algernon Huxley, MD;  Location: [facility];  Service: Cardiovascular;  Laterality: N/A;  . LIGATION OF ARTERIOVENOUS  FISTULA Right 01/31/2016   Procedure: LIGATION OF ARTERIOVENOUS  FISTULA;  Surgeon: Algernon Huxley, MD;  Location: [facility];  Service: Vascular;  Laterality: Right;  . PERIPHERAL VASCULAR CATHETERIZATION Right 12/15/2014   Procedure: Lower Extremity Angiography;  Surgeon: Algernon Huxley, MD;  Location: [facility];  Service: Cardiovascular;  Laterality: Right;  . PERIPHERAL VASCULAR CATHETERIZATION  12/15/2014   Procedure: Lower Extremity Intervention;  Surgeon: Algernon Huxley, MD;  Location: [facility];  Service: Cardiovascular;;  . PERIPHERAL  VASCULAR CATHETERIZATION Right 08/14/2015   Procedure: A/V Shuntogram/Fistulagram;  Surgeon: Algernon Huxley, MD;  Location: [facility];  Service: Cardiovascular;  Laterality: Right;  . PERIPHERAL VASCULAR CATHETERIZATION N/A 08/14/2015   Procedure: A/V Shunt Intervention;  Surgeon: Algernon Huxley, MD;  Location: [facility];  Service: Cardiovascular;  Laterality: N/A;  . PERIPHERAL VASCULAR CATHETERIZATION N/A 01/11/2016   Procedure: Dialysis/Perma Catheter Insertion;  Surgeon: Algernon Huxley, MD;  Location: [facility];   Service: Cardiovascular;  Laterality: N/A;  . REVISON OF ARTERIOVENOUS FISTULA Right 02/17/2016   Procedure: removal of AV fistula;  Surgeon: Serafina Mitchell, MD;  Location: [facility];  Service: Vascular;  Laterality: Right;  . REVISON OF ARTERIOVENOUS FISTULA Right 01/31/2016   Procedure: REVISON OF ARTERIOVENOUS FISTULA ( BRACHIOCEPHALIC ) W/ ARTEGRAFT;  Surgeon: Algernon Huxley, MD;  Location: [facility];  Service: Vascular;  Laterality: Right;  . TRANSMETATARSAL AMPUTATION Right 05/04/2015   Procedure: TRANSMETATARSAL AMPUTATION REVISION, great toe amputation;  Surgeon: Algernon Huxley, MD;  Location: [facility];  Service: Vascular;  Laterality: Right;    SOCIAL HISTORY:   Social History  Substance Use Topics  . Smoking status: Light Tobacco Smoker    Packs/day: 0.25    Types: Cigarettes  . Smokeless tobacco: Never Used     Comment: 5  . Alcohol use No    FAMILY HISTORY:   Family History  Problem Relation Age of Onset  . Heart failure Other   . Hypertension Other   . Leukemia Other   . Diabetes Other     DRUG ALLERGIES:   Allergies  Allergen Reactions  . Dust Mite Extract Other (See Comments)    Reaction: unknown    REVIEW OF SYSTEMS:   Review of Systems  Constitutional: Positive for malaise/fatigue. Negative for chills and fever.  HENT: Negative for congestion.   Eyes: Negative for blurred vision and double vision.  Respiratory: Positive for cough, sputum production and shortness of breath. Negative for hemoptysis, wheezing and stridor.   Cardiovascular: Negative for chest pain, palpitations and leg swelling.  Gastrointestinal: Negative for abdominal pain, blood in stool, diarrhea, melena, nausea and vomiting.  Genitourinary: Negative for dysuria and hematuria.  Musculoskeletal: Negative for back pain.  Skin: Negative for itching and rash.  Neurological: Positive for weakness. Negative for dizziness, focal weakness, loss of consciousness and headaches.  Psychiatric/Behavioral:  Negative for depression. The patient is nervous/anxious.     MEDICATIONS AT HOME:   Prior to Admission medications   Medication Sig Start Date End Date Taking? Authorizing Provider  acetaminophen (TYLENOL) 500 MG tablet Take 1,000 mg by mouth every 6 (six) hours as needed for mild pain.     [provider]  aspirin 325 MG tablet Take 325 mg by mouth every morning.     [provider]  atorvastatin (LIPITOR) 10 MG tablet Take 10 mg by mouth at bedtime.     [provider]  budesonide-formoterol (SYMBICORT) 160-4.5 MCG/ACT inhaler Inhale 2 puffs into the lungs 2 (two) times daily. Reported on 08/14/2015    [provider]  calcium acetate (PHOSLO) 667 MG capsule Take 667 mg by mouth 3 (three) times daily with meals.    [provider]  Darbepoetin Alfa (ARANESP) 40 MCG/0.4ML SOSY injection Inject 0.4 mLs (40 mcg total) into the vein every Monday with hemodialysis. 05/16/14   Ghimire, Henreitta Leber, MD  docusate sodium (COLACE) 100 MG capsule Take 100 mg by mouth 2 (two) times  daily as needed for mild constipation. Reported on 08/14/2015    [provider]  doxercalciferol (HECTOROL) 4 MCG/2ML injection Inject 1.25 mLs (2.5 mcg total) into the vein every Monday, Wednesday, and Friday with hemodialysis. 05/10/14   Ghimire, Henreitta Leber, MD  ferric gluconate 125 mg in sodium chloride 0.9 % 100 mL Inject 125 mg into the vein every Monday, Wednesday, and Friday with hemodialysis. 05/10/14   Ghimire, Henreitta Leber, MD  fluticasone (FLONASE) 50 MCG/ACT nasal spray Place 1 spray into both nostrils daily.    [provider]  Ipratropium-Albuterol (COMBIVENT IN) Inhale 2 puffs into the lungs every 6 (six) hours as needed (for wheezing/shortness of breath). COMBIVENT AEROSOL 18-103 MCG/ACT    [provider]  ipratropium-albuterol (DUONEB) 0.5-2.5 (3) MG/3ML SOLN Take 3 mLs by nebulization every 4 (four) hours as needed (SOB).    [provider]  isosorbide mononitrate (IMDUR) 30 MG 24 hr tablet Take 1 tablet (30 mg total) by mouth daily. 08/22/16   Demetrios Loll, MD  Multiple Vitamins-Minerals (MULTIVITAMIN WITH MINERALS) tablet Take 1 tablet by mouth daily.    [provider]  multivitamin (RENA-VIT) TABS tablet Take 1 tablet by mouth at bedtime. 05/10/14   Ghimire, Henreitta Leber, MD  nitroGLYCERIN (NITROSTAT) 0.4 MG SL tablet Place 0.4 mg under the tongue every 5 (five) minutes as needed for chest pain.    [provider]  Nutritional Supplements (FEEDING SUPPLEMENT, NEPRO CARB STEADY,) LIQD Take 237 mLs by mouth daily.    [provider]  oxyCODONE (OXY IR/ROXICODONE) 5 MG immediate release tablet Take 5 mg by mouth every 8 (eight) hours as needed for severe pain.    [provider]  oxyCODONE-acetaminophen (PERCOCET) 7.5-325 MG tablet Take 1 tablet by mouth every 4 (four) hours as needed for severe pain. 07/31/16   Loletha Grayer, MD  pantoprazole (PROTONIX) 20 MG tablet Take 20 mg by mouth daily.    [provider]  polyethylene glycol (MIRALAX / GLYCOLAX) packet Take 17 g by mouth every morning. Hold for loose stools.    [provider]  pregabalin (LYRICA) 75 MG capsule Take 75 mg by mouth 2 (two) times daily.    [provider]  sevelamer carbonate (RENVELA) 800 MG tablet Take 800 mg by mouth daily.     [provider]  simvastatin (ZOCOR) 20 MG tablet Take 20 mg by mouth daily.    [provider]  tiotropium (SPIRIVA) 18 MCG inhalation capsule Place 18 mcg into inhaler and inhale daily.    [provider]  Vancomycin Zollie Pee) 750-5 MG/150ML-% SOLN With each dialysis, Tuesday, Thursday and Saturday. Last dose April 3rd Patient taking differently: Inject 750 mg into the vein every Monday, Wednesday, and Friday with hemodialysis. With each dialysis on Monday, Wednesday, Friday 07/31/16   Loletha Grayer, MD  warfarin (COUMADIN) 5 MG tablet Take 1  tablet (5 mg total) by mouth daily at 6 PM. Patient not taking: Reported on 09/04/2016 08/07/16   Fritzi Mandes, MD  warfarin (COUMADIN) 6 MG tablet Take 6 mg by mouth daily at 6 PM.    [provider]      VITAL SIGNS:  Blood pressure (!) 129/98, pulse 91, temperature 97.7 F (36.5 C), temperature source Oral, resp. rate 17, height 5\' 7"  (1.702 m), weight 196 lb 13.9 oz (89.3 kg), SpO2 100 %.  PHYSICAL EXAMINATION:  Physical Exam  GENERAL:  61 y.o.-year-old patient lying in the bed on BIPAP. EYES: Pupils equal, round,  reactive to light and accommodation. No scleral icterus. Extraocular muscles intact.  HEENT: Head atraumatic, normocephalic. Oropharynx and nasopharynx clear.  NECK:  Supple, no jugular venous distention. No thyroid enlargement, no tenderness.  LUNGS: Diminished breath sounds bilaterally, no wheezing, mild rales, no rhonchi or crepitation. No use of accessory muscles of respiration. Permacath on the left side of the chest. CARDIOVASCULAR: S1, S2 normal. No murmurs, rubs, or gallops.  ABDOMEN: Soft, nontender, nondistended. Bowel sounds present. No organomegaly or mass.  EXTREMITIES: No pedal edema, cyanosis, or clubbing. Left BKA. Nonfunctional AVG on right arm. NEUROLOGIC: Cranial nerves II through XII are intact. Muscle strength 5/5 in all extremities. Sensation intact. Gait not checked.  PSYCHIATRIC: The patient is alert and oriented x 3.  SKIN: No obvious rash, lesion, or ulcer.   LABORATORY PANEL:   CBC  Recent Labs Lab 10/28/16 0553  WBC 7.5  HGB 13.9  HCT 42.5  PLT 144*   ------------------------------------------------------------------------------------------------------------------  Chemistries   Recent Labs Lab 10/28/16 0553  NA 136  K 7.1*  CL 98*  CO2 20*  GLUCOSE 85  BUN 128*  CREATININE 13.80*  CALCIUM 10.0    ------------------------------------------------------------------------------------------------------------------  Cardiac Enzymes  Recent Labs Lab 10/28/16 0553  TROPONINI 0.13*   ------------------------------------------------------------------------------------------------------------------  RADIOLOGY:  Dg Chest Portable 1 View  Result Date: 10/28/2016 CLINICAL DATA:  Sudden onset of shortness of breath this morning. EXAM: PORTABLE CHEST 1 VIEW COMPARISON:  Most recent comparison radiographs 09/19/2016. Chest CT 08/03/2016 FINDINGS: Left-sided dialysis catheter tip remains at the atrial caval junction. Progressive cardiomegaly from prior exam. Small to moderate pleural effusion and basilar opacity, possible increase from prior versus differences in technique. Pulmonary edema appears similar. No new airspace disease. Vascular stent in the right axilla. IMPRESSION: Progressive cardiomegaly. Right pleural effusion with equivocal increase in size from prior. Pulmonary edema appears similar. Suspect CHF exacerbation. Electronically Signed   By: Jeb Levering M.D.   On: 10/28/2016 06:16      IMPRESSION AND PLAN:   Acute hypoxic respiratory failure due to fluid overload The patient will be admitted to stepdown unit. Continue BiPAP, NEB when necessary. Urgent hemodialysis. Intensivist consult.  Hyperkalemia. The patient will be given D50, insulin and calcium gluconate, then urgent hemodialysis. Follow-up potassium level.  Elevated troponin, possible due to demanding ischemia secondary to above. The patient is on aspirin and Coumadin.  ESRD. Continue hemodialysis today.  Hypertension. Continue hypertension medication.  COPD. Continue NEB when necessary, Spiriva and dulera. PVD and history of PE. Continue aspirin and Coumadin pharmacy to dose.   All the records are reviewed and case discussed with ED provider. Management plans discussed with the patient, family and they are in  agreement.  CODE STATUS: Full code. The patient was in DO NOT RESUSCITATE status before, but he wants full code this time.  TOTAL CRITICAL TIME TAKING CARE OF THIS PATIENT: 62 minutes.    Demetrios Loll M.D on 10/28/2016 at 7:46 AM  Between 7am to 6pm - Pager - [phone]  After 6pm go to [email or web address] - [employer] [facility]  Office  [phone]  CC: Primary care physician; [facility] [place]   Note: This dictation was prepared with Diplomatic Services operational officer dictation along with smaller phrase technology. Any transcriptional errors that result from this process are unintentional.

## 2016-10-29 ENCOUNTER — Inpatient Hospital Stay
Admit: 2016-10-29 | Discharge: 2016-10-29 | Disposition: A | Discharge status: Home / Self Care | Payer: Medicare Other | Attending: Pulmonary Disease | Admitting: Pulmonary Disease

## 2016-10-29 ENCOUNTER — Inpatient Hospital Stay: Payer: Medicare Other

## 2016-10-29 ENCOUNTER — Inpatient Hospital Stay: Admit: 2016-10-29 | Payer: Medicare Other

## 2016-10-29 LAB — CBC
HCT: 37.2 % — ABNORMAL LOW (ref 40.0–52.0)
HEMOGLOBIN: 12.3 g/dL — AB (ref 13.0–18.0)
MCH: 29.8 pg (ref 26.0–34.0)
MCHC: 33.2 g/dL (ref 32.0–36.0)
MCV: 90 fL (ref 80.0–100.0)
PLATELETS: 119 10*3/uL — AB (ref 150–440)
RBC: 4.13 MIL/uL — AB (ref 4.40–5.90)
RDW: 15.2 % — ABNORMAL HIGH (ref 11.5–14.5)
WBC: 5.1 10*3/uL (ref 3.8–10.6)

## 2016-10-29 LAB — PROTIME-INR
INR: 3.24
PROTHROMBIN TIME: 33.8 s — AB (ref 11.4–15.2)

## 2016-10-29 LAB — HEPATITIS B SURFACE ANTIBODY, QUANTITATIVE: Hepatitis B-Post: 3.4 m[IU]/mL — ABNORMAL LOW (ref >9.9)

## 2016-10-29 LAB — BASIC METABOLIC PANEL
ANION GAP: 13 (ref 5–15)
BUN: 75 mg/dL — ABNORMAL HIGH (ref 6–20)
CALCIUM: 9 mg/dL (ref 8.9–10.3)
CO2: 26 mmol/L (ref 22–32)
CREATININE: 10.02 mg/dL — AB (ref 0.61–1.24)
Chloride: 99 mmol/L — ABNORMAL LOW (ref 101–111)
GFR, EST AFRICAN AMERICAN: 6 mL/min — AB (ref >60)
GFR, EST NON AFRICAN AMERICAN: 5 mL/min — AB (ref >60)
GLUCOSE: 108 mg/dL — AB (ref 65–99)
Potassium: 5.5 mmol/L — ABNORMAL HIGH (ref 3.5–5.1)
Sodium: 138 mmol/L (ref 135–145)

## 2016-10-29 LAB — PARATHYROID HORMONE, INTACT (NO CA): PTH: 523 pg/mL — ABNORMAL HIGH (ref 15–65)

## 2016-10-29 LAB — PHOSPHORUS: Phosphorus: 3.4 mg/dL (ref 2.5–4.6)

## 2016-10-29 LAB — HEPATITIS B CORE ANTIBODY, TOTAL: Hep B Core Total Ab: POSITIVE — AB

## 2016-10-29 LAB — HEPATITIS B SURFACE ANTIGEN: HEP B S AG: NEGATIVE

## 2016-10-29 MED ORDER — WARFARIN - PHARMACIST DOSING INPATIENT: Freq: Every day | Status: DC

## 2016-10-29 MED ORDER — OXYCODONE-ACETAMINOPHEN 7.5-325 MG PO TABS: 1.0000 | ORAL_TABLET | ORAL | Status: DC | PRN

## 2016-10-29 NOTE — Progress Notes (Signed)
[facility]  ROUNDING NOTE   Subjective:  Patient seen and evaluated during hemodialysis. Appears to be tolerating well. Now off of BiPAP.   Objective:  Vital signs in last 24 hours:  Temp:  [97.4 F (36.3 C)-98.9 F (37.2 C)] 98.9 F (37.2 C) (06/05 1000) Pulse Rate:  [75-91] 75 (06/05 1230) Resp:  [10-19] 16 (06/05 1230) BP: (79-130)/(39-100) 102/79 (06/05 1230) SpO2:  [92 %-100 %] 99 % (06/05 1007) Weight:  [88.6 kg (195 lb 5.2 oz)] 88.6 kg (195 lb 5.2 oz) (06/05 1000)  Weight change: -0.7 kg (-1 lb 8.7 oz) Filed Weights   10/28/16 0900 10/28/16 1115 10/29/16 1000  Weight: 88.6 kg (195 lb 5.2 oz) 88.6 kg (195 lb 5.2 oz) 88.6 kg (195 lb 5.2 oz)    Intake/Output: I/O last 3 completed shifts: In: 358 [P.O.:358] Out: 3000 [Other:3000]   Intake/Output this shift:  No intake/output data recorded.  Physical Exam: General: Critically ill appearing   Head: Normocephalic, atraumatic. Moist oral mucosal membranes  Eyes: Anicteric  Neck: Supple, trachea midline  Lungs:  Bilateral rhonchi, normal effort  Heart: S1S2 no rubs  Abdomen:  Soft, nontender, bowel sounds present  Extremities: Left BKA, right transmetatarsal amputation   Neurologic: Awake, alert, following commands  Skin: No lesions  Access: Left IJ PermCath     Basic Metabolic Panel:  Recent Labs Lab 10/28/16 0553 10/28/16 1111 10/28/16 1910 10/29/16 0455 10/29/16 1105  NA 136  --  135 138  --   K 7.1*  --  5.0 5.5*  --   CL 98*  --  96* 99*  --   CO2 20*  --  27 26  --   GLUCOSE 85  --  122* 108*  --   BUN 128*  --  66* 75*  --   CREATININE 13.80*  --  8.78* 10.02*  --   CALCIUM 10.0  --  9.2 9.0  --   PHOS  --  6.7*  --   --  3.4    Liver Function Tests: No results for input(s): AST, ALT, ALKPHOS, BILITOT, PROT, ALBUMIN in the last 168 hours. No results for input(s): LIPASE, AMYLASE in the last 168 hours. No results for input(s): AMMONIA in the last 168 hours.  CBC:  Recent  Labs Lab 10/28/16 0553 10/29/16 0455  WBC 7.5 5.1  HGB 13.9 12.3*  HCT 42.5 37.2*  MCV 90.0 90.0  PLT 144* 119*    Cardiac Enzymes:  Recent Labs Lab 10/28/16 0553  TROPONINI 0.13*    BNP: Invalid input(s): POCBNP  CBG:  Recent Labs Lab 10/28/16 0847 10/28/16 0850 10/28/16 0922  GLUCAP 75* 19* 95    Microbiology: Results for orders placed or performed during the hospital encounter of 10/28/16  MRSA PCR Screening     Status: Abnormal   Collection Time: 10/28/16  9:43 AM  Result Value Ref Range Status   MRSA by PCR POSITIVE (A) NEGATIVE Final    Comment:        The GeneXpert MRSA Assay (FDA approved for NASAL specimens only), is one component of a comprehensive MRSA colonization surveillance program. It is not intended to diagnose MRSA infection nor to guide or monitor treatment for MRSA infections. RESULT CALLED TO, READ BACK BY AND VERIFIED WITH: CHERYL GREEN AT 7902 ON 10/28/16 [place].     Coagulation Studies:  Recent Labs  10/28/16 0554 10/29/16 0812  LABPROT 35.0* 33.8*  INR 3.38 3.24    Urinalysis: No results for  input(s): COLORURINE, LABSPEC, PHURINE, GLUCOSEU, HGBUR, BILIRUBINUR, KETONESUR, PROTEINUR, UROBILINOGEN, NITRITE, LEUKOCYTESUR in the last 72 hours.  Invalid input(s): APPERANCEUR    Imaging: Dg Chest Port 1 View  Result Date: 10/29/2016 CLINICAL DATA:  Respiratory failure end-stage renal disease on dialysis, CHF, asthma -COPD. EXAM: PORTABLE CHEST 1 VIEW COMPARISON:  Portable chest x-ray of October 28, 2016 FINDINGS: The lungs are mildly hypoinflated. The interstitial markings are increased and more conspicuous today. There is persistent confluent density inferior laterally on the right compatible with pleural fluid. The cardiac silhouette remains enlarged. The pulmonary vascularity is indistinct. The dual-lumen dialysis catheter tip projects at the cavoatrial junction. IMPRESSION: Mild interval worsening of pulmonary edema. Persistent  loculated pleural fluid at the right lung base. I cannot exclude right basilar atelectasis or pneumonia. Electronically Signed   By: David  Martinique M.D.   On: 10/29/2016 07:26   Dg Chest Portable 1 View  Result Date: 10/28/2016 CLINICAL DATA:  Sudden onset of shortness of breath this morning. EXAM: PORTABLE CHEST 1 VIEW COMPARISON:  Most recent comparison radiographs 09/19/2016. Chest CT 08/03/2016 FINDINGS: Left-sided dialysis catheter tip remains at the atrial caval junction. Progressive cardiomegaly from prior exam. Small to moderate pleural effusion and basilar opacity, possible increase from prior versus differences in technique. Pulmonary edema appears similar. No new airspace disease. Vascular stent in the right axilla. IMPRESSION: Progressive cardiomegaly. Right pleural effusion with equivocal increase in size from prior. Pulmonary edema appears similar. Suspect CHF exacerbation. Electronically Signed   By: Jeb Levering M.D.   On: 10/28/2016 06:16     Medications:   . sodium chloride    . sodium chloride    . sodium chloride     . aspirin  325 mg Oral BH-q7a  . atorvastatin  10 mg Oral QHS  . calcium acetate  667 mg Oral TID WC  . Chlorhexidine Gluconate Cloth  6 each Topical Q0600  . doxercalciferol  2.5 mcg Intravenous Q M,W,F-HD  . fluticasone  1 spray Each Nare Daily  . isosorbide mononitrate  30 mg Oral Daily  . mouth rinse  15 mL Mouth Rinse BID  . mometasone-formoterol  2 puff Inhalation BID  . multivitamin  1 tablet Oral QHS  . multivitamin with minerals  1 tablet Oral Daily  . mupirocin ointment  1 application Nasal BID  . pantoprazole  40 mg Oral Daily  . pneumococcal 23 valent vaccine  0.5 mL Intramuscular Tomorrow-1000  . polyethylene glycol  17 g Oral BH-q7a  . pregabalin  75 mg Oral BID  . sevelamer carbonate  800 mg Oral Q lunch  . sodium chloride flush  3 mL Intravenous Q12H  . tiotropium  18 mcg Inhalation Daily  . [START ON 10/30/2016] Warfarin - Pharmacist  Dosing Inpatient   Does not apply q1800   sodium chloride, sodium chloride, sodium chloride, acetaminophen **OR** acetaminophen, albuterol, alteplase, bisacodyl, docusate sodium, heparin, lidocaine (PF), lidocaine-prilocaine, nitroGLYCERIN, ondansetron **OR** ondansetron (ZOFRAN) IV, oxyCODONE-acetaminophen, pentafluoroprop-tetrafluoroeth, senna-docusate, sodium chloride flush  Assessment/ Plan:  61 y.o. male with ESRD on hemodialysis, hypertension, hyperlipidemia, anemia, COPD, peripheral vascular disease, left BKA, right toe amputations, Pulm Embolism 07/2016 requiring Warfarin  MWF [facility].   1. End stage renal disease -  With hyperkalemia - Patient seen and evaluated during hemodialysis today. Serum potassium still slightly high. We will use a 2K bath. Ultrafiltration target 1.5-2 kg today.  2. Anemia of chronic kidney disease: Hemoglobin down to 12.3. Continue to hold Epogen.  3.  acute respiratory failure. Patient was on BiPAP yesterday but is significantly improved now. Continue ultrafiltration with dialysis.  4. Secondary Hyperparathyroidism:  Phosphorus down to 3.4 indicating good phosphorous control. Continue to periodically monitor.   LOS: 1 Dung Prien 6/5/201812:59 PM

## 2016-10-29 NOTE — NC FL2 (Addendum)
[place] LEVEL OF CARE SCREENING TOOL     IDENTIFICATION  Patient Name: [name] Birthdate: 23-Nov-1955 Sex: male Admission Date (Current Location): 10/28/2016  [place] and [place] Number:  Engineering geologist and Address:  [facility], [address] [place], [place] [address]      Provider Number: [id number]  Attending Physician Name and Address:  Demetrios Loll, MD  Relative Name and Phone Number:       Current Level of Care: Hospital Recommended Level of Care: ALF Prior Approval Number:    Date Approved/Denied:   PASRR Number:    Discharge Plan: ALFHome    Current Diagnoses: Patient Active Problem List   Diagnosis Date Noted  . Acute respiratory failure ([place]) 10/28/2016  . Pulmonary embolism ([place]) 08/03/2016  . NSTEMI (non-ST elevated myocardial infarction) ([place]) 07/26/2016  . Acute respiratory failure with hypoxia ([place]) 05/24/2016  . Right lower lobe pneumonia ([place]) 05/24/2016  . Elevated troponin 05/24/2016  . Pulmonary edema 05/21/2016  . Kidney dialysis as the cause of abnormal reaction of the patient, or of later complication, without mention of misadventure at the time of the procedure (CODE) 03/05/2016  . PVD (peripheral vascular disease) ([place]) 03/05/2016  . Preoperative cardiovascular examination 05/02/2015  . Chronic systolic heart failure ([place])   . Sepsis ([place]) 01/13/2015  . Acute encephalopathy 01/13/2015  . Hyperkalemia 01/13/2015  . Gangrene of foot ([place]) 05/04/2014  . ESRD (end stage renal disease) on dialysis ([place]) 05/04/2014  . CAD (coronary artery disease) 05/04/2014  . Normocytic anemia 05/04/2014  . Hyperlipidemia 04/26/2010  . HYPERTENSION, BENIGN 04/26/2010  . CAD, NATIVE VESSEL 04/26/2010  . End stage renal disease ([place]) 04/26/2010    Orientation RESPIRATION BLADDER Height & Weight     Self, Time, Situation, Place  O2 (3 liters) Continent Weight: 195 lb 5.2 oz (88.6 kg) Height:  6\' 2"  (188  cm)  BEHAVIORAL SYMPTOMS/MOOD NEUROLOGICAL BOWEL NUTRITION STATUS   (none)  (none) Continent Diet (renal)  AMBULATORY STATUS COMMUNICATION OF NEEDS Skin   Extensive Assist Verbally Normal                       Personal Care Assistance Level of Assistance  Bathing, Dressing Bathing Assistance: Limited assistance   Dressing Assistance: Limited assistance     Functional Limitations Info   (no issues)          SPECIAL CARE FACTORS FREQUENCY   [facility]                    Contractures Contractures Info: Not present    Additional Factors Info  Code Status, Allergies Code Status Info: full Allergies Info: dust mite extract           DISCHARGE MEDICATIONS:       Allergies as of 10/30/2016      Reactions   Dust Mite Extract Other (See Comments)   Reaction: unknown         Medication List    TAKE these medications   aspirin 325 MG tablet Take 325 mg by mouth every morning.   atorvastatin 10 MG tablet Commonly known as:  LIPITOR Take 10 mg by mouth at bedtime.   budesonide-formoterol 160-4.5 MCG/ACT inhaler Commonly known as:  SYMBICORT Inhale 2 puffs into the lungs 2 (two) times daily. Reported on 08/14/2015   calcium acetate 667 MG capsule Commonly known as:  PHOSLO Take 667 mg by mouth 3 (three)  times daily with meals.   Darbepoetin Alfa 40 MCG/0.4ML Sosy injection Commonly known as:  ARANESP Inject 0.4 mLs (40 mcg total) into the vein every Monday with hemodialysis.   docusate sodium 100 MG capsule Commonly known as:  COLACE Take 100 mg by mouth 2 (two) times daily as needed for mild constipation. Reported on 08/14/2015   doxercalciferol 4 MCG/2ML injection Commonly known as:  HECTOROL Inject 1.25 mLs (2.5 mcg total) into the vein every Monday, Wednesday, and Friday with hemodialysis.   feeding supplement (NEPRO CARB STEADY) Liqd Take 237 mLs by mouth daily.   ferric gluconate 125 mg in sodium chloride 0.9 % 100  mL Inject 125 mg into the vein every Monday, Wednesday, and Friday with hemodialysis.   fluticasone 50 MCG/ACT nasal spray Commonly known as:  FLONASE Place 1 spray into both nostrils daily.   ipratropium-albuterol 0.5-2.5 (3) MG/3ML Soln Commonly known as:  DUONEB Take 3 mLs by nebulization every 4 (four) hours as needed (SOB).   COMBIVENT IN Inhale 2 puffs into the lungs every 6 (six) hours as needed (for wheezing/shortness of breath). COMBIVENT AEROSOL 18-103 MCG/ACT   isosorbide mononitrate 30 MG 24 hr tablet Commonly known as:  IMDUR Take 1 tablet (30 mg total) by mouth daily.   multivitamin Tabs tablet Take 1 tablet by mouth at bedtime.   nitroGLYCERIN 0.4 MG SL tablet Commonly known as:  NITROSTAT Place 0.4 mg under the tongue every 5 (five) minutes as needed for chest pain.   oxyCODONE 5 MG immediate release tablet Commonly known as:  Oxy IR/ROXICODONE Take 5 mg by mouth every 8 (eight) hours as needed for severe pain.   oxyCODONE-acetaminophen 5-325 MG tablet Commonly known as:  PERCOCET/ROXICET Take 1 tablet by mouth every 4 (four) hours as needed for severe pain.   oxyCODONE-acetaminophen 7.5-325 MG tablet Commonly known as:  PERCOCET Take 1 tablet by mouth every 4 (four) hours as needed for severe pain.   polyethylene glycol packet Commonly known as:  MIRALAX / GLYCOLAX Take 17 g by mouth every morning. Hold for loose stools.   pregabalin 75 MG capsule Commonly known as:  LYRICA Take 75 mg by mouth 2 (two) times daily.   sevelamer carbonate 800 MG tablet Commonly known as:  RENVELA Take 800 mg by mouth daily.   tiotropium 18 MCG inhalation capsule Commonly known as:  SPIRIVA Place 18 mcg into inhaler and inhale daily.   Vancomycin 750-5 MG/150ML-% Soln Commonly known as:  VANCOCIN With each dialysis, Tuesday, Thursday and Saturday. Last dose April 3rd   warfarin 2.5 MG tablet Commonly known as:  COUMADIN Take 2.5 mg by mouth daily.  Patient takes 1 tablet(2.5 mg) by mouth once daily.   warfarin 5 MG tablet Commonly known as:  COUMADIN Take 1 tablet (5 mg total) by mouth daily at 6 PM.    Shela Leff, LCSW

## 2016-10-29 NOTE — Progress Notes (Signed)
[place] [facility] [place] NAME: [name]    MR#:  [id number]  DATE OF BIRTH:  1956/02/28  SUBJECTIVE:  CHIEF COMPLAINT:   Chief Complaint  Patient presents with  . Shortness of Breath   Better shortness breath and cough, off BiPAP, on oxygen 3 L by nasal cannula. REVIEW OF SYSTEMS:  Review of Systems  Constitutional: Negative for chills, fever and malaise/fatigue.  HENT: Negative for sore throat.   Eyes: Negative for blurred vision and double vision.  Respiratory: Positive for cough and shortness of breath. Negative for hemoptysis, sputum production, wheezing and stridor.   Cardiovascular: Negative for chest pain, palpitations and leg swelling.  Gastrointestinal: Negative for abdominal pain, blood in stool, diarrhea, melena, nausea and vomiting.  Genitourinary: Negative for dysuria and hematuria.  Musculoskeletal: Negative for back pain.  Skin: Negative for itching and rash.  Neurological: Negative for dizziness, focal weakness, loss of consciousness and weakness.  Psychiatric/Behavioral: Negative for depression. The patient is not nervous/anxious.     DRUG ALLERGIES:   Allergies  Allergen Reactions  . Dust Mite Extract Other (See Comments)    Reaction: unknown   VITALS:  Blood pressure 102/79, pulse 75, temperature 98.9 F (37.2 C), temperature source Oral, resp. rate 16, height 6\' 2"  (1.88 m), weight 195 lb 5.2 oz (88.6 kg), SpO2 99 %. PHYSICAL EXAMINATION:  Physical Exam  Constitutional: He is oriented to person, place, and time and well-developed, well-nourished, and in no distress.  HENT:  Head: Normocephalic.  Eyes: Conjunctivae and EOM are normal. No scleral icterus.  Neck: Normal range of motion. Neck supple. No JVD present. No tracheal deviation present.  Cardiovascular: Normal rate, regular rhythm and normal heart sounds.   Pulmonary/Chest: Effort normal. No respiratory distress. He has no wheezes. He has rales.    Abdominal: Soft. Bowel sounds are normal. He exhibits no distension. There is no tenderness.  Musculoskeletal: Normal range of motion. He exhibits no edema or tenderness.  Neurological: He is alert and oriented to person, place, and time. No cranial nerve deficit.  Skin: No rash noted. No erythema.  Psychiatric: Affect normal.   LABORATORY PANEL:  Male CBC  Recent Labs Lab 10/29/16 0455  WBC 5.1  HGB 12.3*  HCT 37.2*  PLT 119*   ------------------------------------------------------------------------------------------------------------------ Chemistries   Recent Labs Lab 10/29/16 0455  NA 138  K 5.5*  CL 99*  CO2 26  GLUCOSE 108*  BUN 75*  CREATININE 10.02*  CALCIUM 9.0   RADIOLOGY:  Dg Chest Port 1 View  Result Date: 10/29/2016 CLINICAL DATA:  Respiratory failure end-stage renal disease on dialysis, CHF, asthma -COPD. EXAM: PORTABLE CHEST 1 VIEW COMPARISON:  Portable chest x-ray of October 28, 2016 FINDINGS: The lungs are mildly hypoinflated. The interstitial markings are increased and more conspicuous today. There is persistent confluent density inferior laterally on the right compatible with pleural fluid. The cardiac silhouette remains enlarged. The pulmonary vascularity is indistinct. The dual-lumen dialysis catheter tip projects at the cavoatrial junction. IMPRESSION: Mild interval worsening of pulmonary edema. Persistent loculated pleural fluid at the right lung base. I cannot exclude right basilar atelectasis or pneumonia. Electronically Signed   By: David  Martinique M.D.   On: 10/29/2016 07:26   ASSESSMENT AND PLAN:   Acute on chronic hypoxic respiratory failure due to fluid overload Off BiPAP, NEB when necessary. He got urgent hemodialysis. Now on O2 [place] 3L.  Repeat chest x-ray still showed pulmonary edema, Continue hemodialysis today.  Acute  on chronic systolic CHF with pleural effusion and pulmonary edema. Echo this year showed ejection fraction 25% -   30%.  Per  Dr. Alva Garnet, cardiology consult. Continue hemodialysis.  Hyperkalemia. The patient was given D50, insulin and calcium gluconate, then urgent hemodialysis.  K 5.5, Follow-up potassium level after HD.  Elevated troponin, possible due to demanding ischemia secondary to above. The patient is on aspirin and Coumadin.  ESRD. Continue hemodialysis today.  Hypertension. Continue hypertension medication.  COPD. Continue NEB when necessary, Spiriva and dulera. PVD and history of PE. Continue aspirin and Coumadin pharmacy to dose.  All the records are reviewed and case discussed with Care Management/Social Worker. Management plans discussed with the patient, family and they are in agreement.  CODE STATUS: Full Code  TOTAL TIME TAKING CARE OF THIS PATIENT: 39 minutes.   More than 50% of the time was spent in counseling/coordination of care: YES  POSSIBLE D/C IN 2 DAYS, DEPENDING ON CLINICAL CONDITION.   Demetrios Loll M.D on 10/29/2016 at 1:09 PM  Between 7am to 6pm - Pager - [phone]  After 6pm go to [email or web address] - Technical brewer  Hospitalists  Office  [phone]  CC: Primary care physician; Center, [place]  Note: This dictation was prepared with Diplomatic Services operational officer dictation along with smaller phrase technology. Any transcriptional errors that result from this process are unintentional.

## 2016-10-29 NOTE — Progress Notes (Addendum)
[place] for warfarin management  Indication: recent history of DVT/PE; patient on warfarin as an outpatient.    Pharmacy consulted for warfarin management for 61 yo male admitted with acute respiratory failure, goal 2-3. Patient has regularly scheduled dialysis Monday/Wednesday/Friday.  Per Med Rec: Patient on Warfarin 2.5 mg daily PTA.  6/4 INR 3.38 NO Warfarin given 6/5 INR 3.24  Goal:  INR 2-3    Plan:   INR still elevated. Will hold dose again today and recheck INR with am labs.  Will plan on resuming warfarin when INR is in range.    Allergies  Allergen Reactions  . Dust Mite Extract Other (See Comments)    Reaction: unknown    Patient Measurements: Height: 6\' 2"  (188 cm) Weight: 195 lb 5.2 oz (88.6 kg) IBW/kg (Calculated) : 82.2  Vital Signs: Temp: 98.9 F (37.2 C) (06/05 0531) Temp Source: Oral (06/05 0531) BP: 113/80 (06/05 0531) Pulse Rate: 91 (06/05 0531)  Labs:  Recent Labs  10/28/16 0553 10/28/16 0554 10/28/16 1910 10/29/16 0455 10/29/16 0812  HGB 13.9  --   --  12.3*  --   HCT 42.5  --   --  37.2*  --   PLT 144*  --   --  119*  --   LABPROT  --  35.0*  --   --  33.8*  INR  --  3.38  --   --  3.24  CREATININE 13.80*  --  8.78* 10.02*  --   TROPONINI 0.13*  --   --   --   --     Estimated Creatinine Clearance: 8.9 mL/min (A) (by C-G formula based on SCr of 10.02 mg/dL (H)).   Pharmacy will continue to monitor and adjust per consult.   Meigan Pates A 10/29/2016,9:07 AM

## 2016-10-29 NOTE — Clinical Social Work Note (Signed)
Patient known to CSW for several readmissions. Patient is a long term resident at [facility] and he can return when time. Patient is currently off floor for dialysis. CSW full assessment to follow. Shela Leff MSW,LCSW [phone]

## 2016-10-30 LAB — BASIC METABOLIC PANEL
Anion gap: 12 (ref 5–15)
BUN: 54 mg/dL — AB (ref 6–20)
CHLORIDE: 98 mmol/L — AB (ref 101–111)
CO2: 25 mmol/L (ref 22–32)
CREATININE: 7.64 mg/dL — AB (ref 0.61–1.24)
Calcium: 9.6 mg/dL (ref 8.9–10.3)
GFR calc Af Amer: 8 mL/min — ABNORMAL LOW (ref >60)
GFR calc non Af Amer: 7 mL/min — ABNORMAL LOW (ref >60)
GLUCOSE: 82 mg/dL (ref 65–99)
POTASSIUM: 5.4 mmol/L — AB (ref 3.5–5.1)
Sodium: 135 mmol/L (ref 135–145)

## 2016-10-30 LAB — PHOSPHORUS: PHOSPHORUS: 6.5 mg/dL — AB (ref 2.5–4.6)

## 2016-10-30 LAB — PROTIME-INR
INR: 2
Prothrombin Time: 23 seconds — ABNORMAL HIGH (ref 11.4–15.2)

## 2016-10-30 MED ORDER — WARFARIN SODIUM 5 MG PO TABS: 2.5000 mg | ORAL_TABLET | Freq: Once | ORAL | Status: DC

## 2016-10-30 NOTE — Care Management (Signed)
Patient admitted with respiratory failure.  Patient from [facility].  CSW facilitating discharge.  Patient open with [facility] for RN and PT.  Resumption orders have been placed.  [place] from [facility] notified of discharge.  [facility] HD liaison notified discharge.  Patient family to transport at discharge.  Patient did not qualify for home o2.  See nursing documentation.  RNCM signing off.

## 2016-10-30 NOTE — Progress Notes (Signed)
10/30/2016  4:03 PM  [name] to be D/C'd Home per MD order.  Discussed prescriptions and follow up appointments with the patient. Prescriptions given to patient, medication list explained in detail. Pt verbalized understanding.  Allergies as of 10/30/2016      Reactions   Dust Mite Extract Other (See Comments)   Reaction: unknown      Medication List    TAKE these medications   aspirin 325 MG tablet Take 325 mg by mouth every morning.   atorvastatin 10 MG tablet Commonly known as:  LIPITOR Take 10 mg by mouth at bedtime.   budesonide-formoterol 160-4.5 MCG/ACT inhaler Commonly known as:  SYMBICORT Inhale 2 puffs into the lungs 2 (two) times daily. Reported on 08/14/2015   calcium acetate 667 MG capsule Commonly known as:  PHOSLO Take 667 mg by mouth 3 (three) times daily with meals.   Darbepoetin Alfa 40 MCG/0.4ML Sosy injection Commonly known as:  ARANESP Inject 0.4 mLs (40 mcg total) into the vein every Monday with hemodialysis.   docusate sodium 100 MG capsule Commonly known as:  COLACE Take 100 mg by mouth 2 (two) times daily as needed for mild constipation. Reported on 08/14/2015   doxercalciferol 4 MCG/2ML injection Commonly known as:  HECTOROL Inject 1.25 mLs (2.5 mcg total) into the vein every Monday, Wednesday, and Friday with hemodialysis.   feeding supplement (NEPRO CARB STEADY) Liqd Take 237 mLs by mouth daily.   ferric gluconate 125 mg in sodium chloride 0.9 % 100 mL Inject 125 mg into the vein every Monday, Wednesday, and Friday with hemodialysis.   fluticasone 50 MCG/ACT nasal spray Commonly known as:  FLONASE Place 1 spray into both nostrils daily.   ipratropium-albuterol 0.5-2.5 (3) MG/3ML Soln Commonly known as:  DUONEB Take 3 mLs by nebulization every 4 (four) hours as needed (SOB).   COMBIVENT IN Inhale 2 puffs into the lungs every 6 (six) hours as needed (for wheezing/shortness of breath). COMBIVENT AEROSOL 18-103 MCG/ACT   isosorbide  mononitrate 30 MG 24 hr tablet Commonly known as:  IMDUR Take 1 tablet (30 mg total) by mouth daily.   multivitamin Tabs tablet Take 1 tablet by mouth at bedtime.   nitroGLYCERIN 0.4 MG SL tablet Commonly known as:  NITROSTAT Place 0.4 mg under the tongue every 5 (five) minutes as needed for chest pain.   oxyCODONE 5 MG immediate release tablet Commonly known as:  Oxy IR/ROXICODONE Take 5 mg by mouth every 8 (eight) hours as needed for severe pain.   oxyCODONE-acetaminophen 5-325 MG tablet Commonly known as:  PERCOCET/ROXICET Take 1 tablet by mouth every 4 (four) hours as needed for severe pain.   oxyCODONE-acetaminophen 7.5-325 MG tablet Commonly known as:  PERCOCET Take 1 tablet by mouth every 4 (four) hours as needed for severe pain.   polyethylene glycol packet Commonly known as:  MIRALAX / GLYCOLAX Take 17 g by mouth every morning. Hold for loose stools.   pregabalin 75 MG capsule Commonly known as:  LYRICA Take 75 mg by mouth 2 (two) times daily.   sevelamer carbonate 800 MG tablet Commonly known as:  RENVELA Take 800 mg by mouth daily.   tiotropium 18 MCG inhalation capsule Commonly known as:  SPIRIVA Place 18 mcg into inhaler and inhale daily.   Vancomycin 750-5 MG/150ML-% Soln Commonly known as:  VANCOCIN With each dialysis, Tuesday, Thursday and Saturday. Last dose April 3rd   warfarin 2.5 MG tablet Commonly known as:  COUMADIN Take 2.5 mg by mouth  daily. Patient takes 1 tablet(2.5 mg) by mouth once daily.   warfarin 5 MG tablet Commonly known as:  COUMADIN Take 1 tablet (5 mg total) by mouth daily at 6 PM.            Durable Medical Equipment        Start     Ordered   10/30/16 1345  For home use only DME oxygen  Once    Question Answer Comment  Mode or (Route) Nasal cannula   Liters per Minute 3   Frequency Continuous (stationary and portable oxygen unit needed)   Oxygen conserving device Yes   Oxygen delivery system Gas      10/30/16  1345      Vitals:   10/30/16 1211 10/30/16 1231  BP:  108/77  Pulse: 81 71  Resp: 16 16  Temp: 98 F (36.7 C) 98 F (36.7 C)    Skin clean, dry and intact without evidence of skin break down, no evidence of skin tears noted. IV catheter discontinued intact. Site without signs and symptoms of complications. Dressing and pressure applied. Pt denies pain at this time. No complaints noted.  An After Visit Summary was printed and given to the patient. Patient escorted via [place], and D/C home via private auto.  [name]

## 2016-10-30 NOTE — Discharge Summary (Addendum)
[name] at [facility] NAME: Suhaas Agena    MR#:  [id number]  DATE OF BIRTH:  [birth year]  DATE OF ADMISSION:  10/28/2016   ADMITTING PHYSICIAN: Demetrios Loll, MD  DATE OF DISCHARGE: 10/30/2016 PRIMARY CARE PHYSICIAN: [facility] [place]   ADMISSION DIAGNOSIS:  Hyperkalemia [E87.5] Acute pulmonary edema (HCC) [J81.0] SOB (shortness of breath) [R06.02] Pleural effusion [J90] DISCHARGE DIAGNOSIS:  Active Problems:   Acute respiratory failure (HCC) Acute hypoxic respiratory failure due to fluid overload Acute on chronic systolic CHF with pleural effusion and pulmonary edema. Hyperkalemia SECONDARY DIAGNOSIS:   Past Medical History:  Diagnosis Date  . 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 Amputee, below knee, left ([place])   . Anemia   . Asthma   . Cardiomyopathy ([place])   . CHF (congestive heart failure) ([place])   . Chronic systolic heart failure ([place])   . Complication of anesthesia    hypotension  . COPD (chronic obstructive pulmonary disease) ([place])   . Coronary artery disease   . Dialysis patient ([place])    Mon, Wed, Fri  . End stage renal disease ([place])   . GERD (gastroesophageal reflux disease)   . Headache   . History of kidney stones   . History of pulmonary embolism   . HLD (hyperlipidemia)   . HTN (hypertension)   . Hyperparathyroidism   . Myocardial infarction ([place])   . Peripheral vascular disease ([place])   . Shortness of breath dyspnea   . Sleep apnea    NO C-PAP, Patient stated in process of  "getting one"   . Tobacco dependence    HOSPITAL COURSE:  Acute hypoxic respiratory failure due to fluid overload Off BiPAP, NEBwhen necessary. He got urgent hemodialysis. Now on O2 [place] 3L.  Repeat chest x-ray still showed pulmonary edema. He got hemodialysis daily for 3 days here.  Acute on chronic systolic CHF with pleural effusion and pulmonary edema. Echo this year showed ejection fraction 25% -  30%.  Per Dr. Alva Garnet, cardiology consult. Continue hemodialysis.  Hyperkalemia. The patient was given D50, insulin and calcium gluconate, then urgent hemodialysis.  K 5.4 this am, s/p HD just now. Follow-up potassium as outpatient.  Elevated troponin, possible due to demanding ischemia secondary to above. The patient is on aspirin and Coumadin.  ESRD. Continue hemodialysis today.  Hypertension. Continue hypertension medication.  COPD. Continue NEB when necessary, Spiriva and dulera. PVD and history of PE. Continue aspirin and Coumadin pharmacy to dose.  DISCHARGE CONDITIONS:  Stable, discharge to ALF today. CONSULTS OBTAINED:  Treatment Team:  Corey Skains, MD Yolonda Kida, MD DRUG ALLERGIES:   Allergies  Allergen Reactions  . Dust Mite Extract Other (See Comments)    Reaction: unknown   DISCHARGE MEDICATIONS:   Allergies as of 10/30/2016      Reactions   Dust Mite Extract Other (See Comments)   Reaction: unknown      Medication List    TAKE these medications   aspirin 325 MG tablet Take 325 mg by mouth every morning.   atorvastatin 10 MG tablet Commonly known as:  LIPITOR Take 10 mg by mouth at bedtime.   budesonide-formoterol 160-4.5 MCG/ACT inhaler Commonly known as:  SYMBICORT Inhale 2 puffs into the lungs 2 (two) times daily. Reported on 08/14/2015   calcium acetate 667 MG capsule Commonly known as:  PHOSLO Take 667 mg by mouth 3 (three) times daily with meals.  Darbepoetin Alfa 40 MCG/0.4ML Sosy injection Commonly known as:  ARANESP Inject 0.4 mLs (40 mcg total) into the vein every Monday with hemodialysis.   docusate sodium 100 MG capsule Commonly known as:  COLACE Take 100 mg by mouth 2 (two) times daily as needed for mild constipation. Reported on 08/14/2015   doxercalciferol 4 MCG/2ML injection Commonly known as:  HECTOROL Inject 1.25 mLs (2.5 mcg total) into the vein every Monday, Wednesday, and Friday with hemodialysis.     feeding supplement (NEPRO CARB STEADY) Liqd Take 237 mLs by mouth daily.   ferric gluconate 125 mg in sodium chloride 0.9 % 100 mL Inject 125 mg into the vein every Monday, Wednesday, and Friday with hemodialysis.   fluticasone 50 MCG/ACT nasal spray Commonly known as:  FLONASE Place 1 spray into both nostrils daily.   ipratropium-albuterol 0.5-2.5 (3) MG/3ML Soln Commonly known as:  DUONEB Take 3 mLs by nebulization every 4 (four) hours as needed (SOB).   COMBIVENT IN Inhale 2 puffs into the lungs every 6 (six) hours as needed (for wheezing/shortness of breath). COMBIVENT AEROSOL 18-103 MCG/ACT   isosorbide mononitrate 30 MG 24 hr tablet Commonly known as:  IMDUR Take 1 tablet (30 mg total) by mouth daily.   multivitamin Tabs tablet Take 1 tablet by mouth at bedtime.   nitroGLYCERIN 0.4 MG SL tablet Commonly known as:  NITROSTAT Place 0.4 mg under the tongue every 5 (five) minutes as needed for chest pain.   oxyCODONE 5 MG immediate release tablet Commonly known as:  Oxy IR/ROXICODONE Take 5 mg by mouth every 8 (eight) hours as needed for severe pain.   oxyCODONE-acetaminophen 5-325 MG tablet Commonly known as:  PERCOCET/ROXICET Take 1 tablet by mouth every 4 (four) hours as needed for severe pain.   oxyCODONE-acetaminophen 7.5-325 MG tablet Commonly known as:  PERCOCET Take 1 tablet by mouth every 4 (four) hours as needed for severe pain.   polyethylene glycol packet Commonly known as:  MIRALAX / GLYCOLAX Take 17 g by mouth every morning. Hold for loose stools.   pregabalin 75 MG capsule Commonly known as:  LYRICA Take 75 mg by mouth 2 (two) times daily.   sevelamer carbonate 800 MG tablet Commonly known as:  RENVELA Take 800 mg by mouth daily.   tiotropium 18 MCG inhalation capsule Commonly known as:  SPIRIVA Place 18 mcg into inhaler and inhale daily.   Vancomycin 750-5 MG/150ML-% Soln Commonly known as:  VANCOCIN With each dialysis, Tuesday, Thursday  and Saturday. Last dose April 3rd   warfarin 2.5 MG tablet Commonly known as:  COUMADIN Take 2.5 mg by mouth daily. Patient takes 1 tablet(2.5 mg) by mouth once daily.   warfarin 5 MG tablet Commonly known as:  COUMADIN Take 1 tablet (5 mg total) by mouth daily at 6 PM.        DISCHARGE INSTRUCTIONS:  See AVS.  If you experience worsening of your admission symptoms, develop shortness of breath, life threatening emergency, suicidal or homicidal thoughts you must seek medical attention immediately by calling 911 or calling your MD immediately  if symptoms less severe.  You Must read complete instructions/literature along with all the possible adverse reactions/side effects for all the Medicines you take and that have been prescribed to you. Take any new Medicines after you have completely understood and accpet all the possible adverse reactions/side effects.   Please note  You were cared for by a hospitalist during your hospital stay. If you have any questions about your  discharge medications or the care you received while you were in the hospital after you are discharged, you can call the unit and asked to speak with the hospitalist on call if the hospitalist that took care of you is not available. Once you are discharged, your primary care physician will handle any further medical issues. Please note that NO REFILLS for any discharge medications will be authorized once you are discharged, as it is imperative that you return to your primary care physician (or establish a relationship with a primary care physician if you do not have one) for your aftercare needs so that they can reassess your need for medications and monitor your lab values.    On the day of Discharge:  VITAL SIGNS:  Blood pressure 108/77, pulse 71, temperature 98 F (36.7 C), resp. rate 16, height 6\' 2"  (1.88 m), weight 183 lb 3.2 oz (83.1 kg), SpO2 100 %. PHYSICAL EXAMINATION:  GENERAL:  61 y.o.-year-old patient lying  in the bed with no acute distress.  EYES: Pupils equal, round, reactive to light and accommodation. No scleral icterus. Extraocular muscles intact.  HEENT: Head atraumatic, normocephalic. Oropharynx and nasopharynx clear.  NECK:  Supple, no jugular venous distention. No thyroid enlargement, no tenderness.  LUNGS: Normal breath sounds bilaterally, no wheezing, rales,rhonchi or crepitation. No use of accessory muscles of respiration.  CARDIOVASCULAR: S1, S2 normal. No murmurs, rubs, or gallops.  ABDOMEN: Soft, non-tender, non-distended. Bowel sounds present. No organomegaly or mass.  EXTREMITIES: No pedal edema, cyanosis, or clubbing. Left BKA. NEUROLOGIC: Cranial nerves II through XII are intact. Muscle strength 5/5 in all extremities. Sensation intact. Gait not checked.  PSYCHIATRIC: The patient is alert and oriented x 3.  SKIN: No obvious rash, lesion, or ulcer.  DATA REVIEW:   CBC  Recent Labs Lab 10/29/16 0455  WBC 5.1  HGB 12.3*  HCT 37.2*  PLT 119*    Chemistries   Recent Labs Lab 10/30/16 0520  NA 135  K 5.4*  CL 98*  CO2 25  GLUCOSE 82  BUN 54*  CREATININE 7.64*  CALCIUM 9.6     Microbiology Results  Results for orders placed or performed during the hospital encounter of 10/28/16  MRSA PCR Screening     Status: Abnormal   Collection Time: 10/28/16  9:43 AM  Result Value Ref Range Status   MRSA by PCR POSITIVE (A) NEGATIVE Final    Comment:        The GeneXpert MRSA Assay (FDA approved for NASAL specimens only), is one component of a comprehensive MRSA colonization surveillance program. It is not intended to diagnose MRSA infection nor to guide or monitor treatment for MRSA infections. RESULT CALLED TO, READ BACK BY AND VERIFIED WITH: CHERYL GREEN AT 8416 ON 10/28/16 [place].     RADIOLOGY:  No results found.   Management plans discussed with the patient, family and they are in agreement.  CODE STATUS: Full Code   TOTAL TIME TAKING CARE OF THIS  PATIENT: 33 minutes.    Demetrios Loll M.D on 10/30/2016 at 1:31 PM  Between 7am to 6pm - Pager - [phone]  After 6pm go to [email or web address] - Technical brewer [facility]  Office  [phone]  CC: Primary care physician; [facility] [place]   Note: This dictation was prepared with Diplomatic Services operational officer dictation along with smaller phrase technology. Any transcriptional errors that result from this process are unintentional.

## 2016-10-30 NOTE — Clinical Social Work Note (Signed)
Clinical Social Work Assessment  Patient Details  Name: [name] MRN: [id number] Date of Birth: [birth year]  Date of referral:  10/30/16               Reason for consult:  Facility Placement                Permission sought to share information with:  Facility Sport and exercise psychologist, Family Supports Permission granted to share information::  Yes, Verbal Permission Granted  Name::        Agency::     Relationship::     Contact Information:     Housing/Transportation Living arrangements for the past 2 months:  Group Home Source of Information:  Patient, Other (Comment Required) (Sibling) Patient Interpreter Needed:  None Criminal Activity/Legal Involvement Pertinent to Current Situation/Hospitalization:  No - Comment as needed Significant Relationships:  Siblings Lives with:  Facility Resident Do you feel safe going back to the place where you live?  Yes Need for family participation in patient care:  No (Coment)  Care giving concerns:  Patient discharged from [facility] this past Friday and was sent to a group home owned by [facility]: [phone].   Social Worker assessment / plan:  CSW met with patient once he came back from dialysis. Patient explained that he did not remember the group home name but that his sister would have the number to the owner. Patient reports his sister is also in the hospital but that I could speak with her. CSW contacted patient's sister: [name]: [phone]. Ms. [name] gave CSW the number for [name] and CSW called [name] and he is good with taking patient back today. When CSW asked him if patient was on chronic oxygen, he stated that [facility] was suppose to set this up on Friday when he discharged from there but never did. Rn CM is aware and will arrange oxygen.   Employment status:  Disabled (Comment on whether or not currently receiving Disability) Insurance information:  Medicare PT Recommendations:    Information /  Referral to community resources:     Patient/Family's Response to care: Patient expressed appreciation for CSW assistance.  Patient/Family's Understanding of and Emotional Response to Diagnosis, Current Treatment, and Prognosis:  Patient is aware of his limitations and is wanting to return to the group home today.  Emotional Assessment Appearance:  Appears stated age Attitude/Demeanor/Rapport:   (pleasant and cooperative) Affect (typically observed):  Accepting, Adaptable, Calm Orientation:  Oriented to Self, Oriented to Place, Oriented to  Time Alcohol / Substance use:  Not Applicable Psych involvement (Current and /or in the community):  No (Comment)  Discharge Needs  Concerns to be addressed:  Care Coordination Readmission within the last 30 days:  Yes Current discharge risk:  None Barriers to Discharge:  No Barriers Identified   Shela Leff, LCSW 10/30/2016, 1:47 PM

## 2016-10-30 NOTE — Discharge Instructions (Signed)
Renal  And heart healthy diet. Continue home O2 [place] 3L.

## 2016-10-30 NOTE — Progress Notes (Signed)
[facility]  ROUNDING NOTE   Subjective:  Patient seen and evaluated during his normal dialysis today. Serum potassium remained slightly high. Breathing comfortably at the moment.   Objective:  Vital signs in last 24 hours:  Temp:  [98 F (36.7 C)-98.9 F (37.2 C)] 98.3 F (36.8 C) (06/06 0457) Pulse Rate:  [75-95] 95 (06/06 0457) Resp:  [14-20] 20 (06/06 0457) BP: (79-130)/(39-100) 107/75 (06/06 0457) SpO2:  [99 %-100 %] 100 % (06/06 0457) Weight:  [87.6 kg (193 lb 2 oz)-88.6 kg (195 lb 5.2 oz)] 87.6 kg (193 lb 2 oz) (06/05 1323)  Weight change: 0 kg (0 lb) Filed Weights   10/28/16 1115 10/29/16 1000 10/29/16 1323  Weight: 88.6 kg (195 lb 5.2 oz) 88.6 kg (195 lb 5.2 oz) 87.6 kg (193 lb 2 oz)    Intake/Output: I/O last 3 completed shifts: In: 0  Out: 1217 [Other:1217]   Intake/Output this shift:  No intake/output data recorded.  Physical Exam: General: No acute distress   Head: Normocephalic, atraumatic. Moist oral mucosal membranes  Eyes: Anicteric  Neck: Supple, trachea midline  Lungs:  Clear to auscultation bilateral, normal effort  Heart: S1S2 no rubs  Abdomen:  Soft, nontender, bowel sounds present  Extremities: Left BKA, right transmetatarsal amputation   Neurologic: Awake, alert, following commands  Skin: No lesions  Access: Left IJ PermCath     Basic Metabolic Panel:  Recent Labs Lab 10/28/16 0553 10/28/16 1111 10/28/16 1910 10/29/16 0455 10/29/16 1105 10/30/16 0520  NA 136  --  135 138  --  135  K 7.1*  --  5.0 5.5*  --  5.4*  CL 98*  --  96* 99*  --  98*  CO2 20*  --  27 26  --  25  GLUCOSE 85  --  122* 108*  --  82  BUN 128*  --  66* 75*  --  54*  CREATININE 13.80*  --  8.78* 10.02*  --  7.64*  CALCIUM 10.0  --  9.2 9.0  --  9.6  PHOS  --  6.7*  --   --  3.4  --     Liver Function Tests: No results for input(s): AST, ALT, ALKPHOS, BILITOT, PROT, ALBUMIN in the last 168 hours. No results for input(s): LIPASE, AMYLASE in  the last 168 hours. No results for input(s): AMMONIA in the last 168 hours.  CBC:  Recent Labs Lab 10/28/16 0553 10/29/16 0455  WBC 7.5 5.1  HGB 13.9 12.3*  HCT 42.5 37.2*  MCV 90.0 90.0  PLT 144* 119*    Cardiac Enzymes:  Recent Labs Lab 10/28/16 0553  TROPONINI 0.13*    BNP: Invalid input(s): POCBNP  CBG:  Recent Labs Lab 10/28/16 0847 10/28/16 0850 10/28/16 0922  GLUCAP 57* 25* 95    Microbiology: Results for orders placed or performed during the hospital encounter of 10/28/16  MRSA PCR Screening     Status: Abnormal   Collection Time: 10/28/16  9:43 AM  Result Value Ref Range Status   MRSA by PCR POSITIVE (A) NEGATIVE Final    Comment:        The GeneXpert MRSA Assay (FDA approved for NASAL specimens only), is one component of a comprehensive MRSA colonization surveillance program. It is not intended to diagnose MRSA infection nor to guide or monitor treatment for MRSA infections. RESULT CALLED TO, READ BACK BY AND VERIFIED WITH: CHERYL GREEN AT 2409 ON 10/28/16 [place].     Coagulation Studies:  Recent Labs  10/28/16 0554 10/29/16 0812 10/30/16 0520  LABPROT 35.0* 33.8* 23.0*  INR 3.38 3.24 2.00    Urinalysis: No results for input(s): COLORURINE, LABSPEC, PHURINE, GLUCOSEU, HGBUR, BILIRUBINUR, KETONESUR, PROTEINUR, UROBILINOGEN, NITRITE, LEUKOCYTESUR in the last 72 hours.  Invalid input(s): APPERANCEUR    Imaging: Dg Chest Port 1 View  Result Date: 10/29/2016 CLINICAL DATA:  Respiratory failure end-stage renal disease on dialysis, CHF, asthma -COPD. EXAM: PORTABLE CHEST 1 VIEW COMPARISON:  Portable chest x-ray of October 28, 2016 FINDINGS: The lungs are mildly hypoinflated. The interstitial markings are increased and more conspicuous today. There is persistent confluent density inferior laterally on the right compatible with pleural fluid. The cardiac silhouette remains enlarged. The pulmonary vascularity is indistinct. The dual-lumen dialysis  catheter tip projects at the cavoatrial junction. IMPRESSION: Mild interval worsening of pulmonary edema. Persistent loculated pleural fluid at the right lung base. I cannot exclude right basilar atelectasis or pneumonia. Electronically Signed   By: David  Martinique M.D.   On: 10/29/2016 07:26     Medications:   . sodium chloride    . sodium chloride    . sodium chloride     . aspirin  325 mg Oral BH-q7a  . atorvastatin  10 mg Oral QHS  . calcium acetate  667 mg Oral TID WC  . Chlorhexidine Gluconate Cloth  6 each Topical Q0600  . doxercalciferol  2.5 mcg Intravenous Q M,W,F-HD  . fluticasone  1 spray Each Nare Daily  . isosorbide mononitrate  30 mg Oral Daily  . mouth rinse  15 mL Mouth Rinse BID  . mometasone-formoterol  2 puff Inhalation BID  . multivitamin  1 tablet Oral QHS  . multivitamin with minerals  1 tablet Oral Daily  . mupirocin ointment  1 application Nasal BID  . pantoprazole  40 mg Oral Daily  . pneumococcal 23 valent vaccine  0.5 mL Intramuscular Tomorrow-1000  . polyethylene glycol  17 g Oral BH-q7a  . pregabalin  75 mg Oral BID  . sevelamer carbonate  800 mg Oral Q lunch  . sodium chloride flush  3 mL Intravenous Q12H  . tiotropium  18 mcg Inhalation Daily  . Warfarin - Pharmacist Dosing Inpatient   Does not apply q1800   sodium chloride, sodium chloride, sodium chloride, acetaminophen **OR** acetaminophen, albuterol, alteplase, bisacodyl, docusate sodium, heparin, lidocaine (PF), lidocaine-prilocaine, nitroGLYCERIN, ondansetron **OR** ondansetron (ZOFRAN) IV, oxyCODONE-acetaminophen, pentafluoroprop-tetrafluoroeth, senna-docusate, sodium chloride flush  Assessment/ Plan:  61 y.o. male with ESRD on hemodialysis, hypertension, hyperlipidemia, anemia, COPD, peripheral vascular disease, left BKA, right toe amputations, Pulm Embolism 07/2016 requiring Warfarin  [facility].   1. End stage renal disease -  With hyperkalemia - Since admission  the patient has improved. Patient seen and evaluated during dialysis today. Potassium still slightly high at 5.4. We will continue use 2K bath for today.  2. Anemia of chronic kidney disease: We will hold Epogen at this time as most recent hemoglobin was 12.3.  3.   acute respiratory failure. Patient was initially on BiPAP at admission but is now off and breathing comfortably.  4. Secondary Hyperparathyroidism:  Patient has good phosphorous control. Continue to periodically monitor serum phosphorus.   LOS: 2 Erling Arrazola 6/6/20189:14 AM

## 2016-10-30 NOTE — Care Management Important Message (Signed)
Important Message  Patient Details  Name: [name] MRN: [id number] Date of Birth: [birth year]   Medicare Important Message Given:  N/A - LOS <3 / Initial given by admissions    Beverly Sessions, RN 10/30/2016, 2:14 PM

## 2016-10-30 NOTE — Progress Notes (Signed)
[facility] for warfarin management  Indication: recent history of DVT/PE; patient on warfarin as an outpatient.    Pharmacy consulted for warfarin management for 61 yo male admitted with acute respiratory failure and hx of PE, goal 2-3. Patient has regularly scheduled dialysis Monday/Wednesday/Friday.   Per Med Rec: Patient on Warfarin 2.5 mg daily PTA.  6/4 INR 3.38 NO Warfarin given 6/5 INR 3.24 No warfarin given 6/6   INR 2.00  Goal:  INR 2-3    Plan:   INR within goal range today. Will resume home dose of warfarin 2.5 mg for today. CBC every three days F/u INR in AM  Allergies  Allergen Reactions  . Dust Mite Extract Other (See Comments)    Reaction: unknown    Patient Measurements: Height: 6\' 2"  (188 cm) Weight: 185 lb 6.5 oz (84.1 kg) IBW/kg (Calculated) : 82.2  Vital Signs: Temp: 98.2 F (36.8 C) (06/06 0900) Temp Source: Oral (06/06 0900) BP: 83/55 (06/06 0907) Pulse Rate: 76 (06/06 0907)  Labs:  Recent Labs  10/28/16 0553 10/28/16 0554 10/28/16 1910 10/29/16 0455 10/29/16 0812 10/30/16 0520  HGB 13.9  --   --  12.3*  --   --   HCT 42.5  --   --  37.2*  --   --   PLT 144*  --   --  119*  --   --   LABPROT  --  35.0*  --   --  33.8* 23.0*  INR  --  3.38  --   --  3.24 2.00  CREATININE 13.80*  --  8.78* 10.02*  --  7.64*  TROPONINI 0.13*  --   --   --   --   --     Estimated Creatinine Clearance: 11.7 mL/min (A) (by C-G formula based on SCr of 7.64 mg/dL (H)).   Pharmacy will continue to monitor and adjust per consult.   Rocky Morel 10/30/2016,9:34 AM

## 2016-10-30 NOTE — Progress Notes (Signed)
SATURATION QUALIFICATIONS: (This note is used to comply with regulatory documentation for home oxygen)  Patient Saturations on Room Air at Rest = 96%  Patient Saturations on Room Air with exertion = 88%  Please briefly explain why patient does not qualify for home oxygen:  Pt with old BKA and without prosthesis present.  Performed stand and pivot to chair and back to bed x5.  Sp02  Dropped as low as 88% on room air with exertion, but rebounded to as high as 95% on room air. Dola Argyle, RN

## 2016-10-30 NOTE — Clinical Social Work Note (Signed)
CSW informed that patient is no longer at [facility] and that he was discharged to [facility]. CSW will speak with patient once he returns from dialysis. Shela Leff MSW,LCSW [phone]

## 2016-10-31 ENCOUNTER — Other Ambulatory Visit: Payer: Medicare Other

## 2016-10-31 ENCOUNTER — Ambulatory Visit (INDEPENDENT_AMBULATORY_CARE_PROVIDER_SITE_OTHER): Payer: Medicare Other | Admitting: Vascular Surgery

## 2016-10-31 ENCOUNTER — Encounter (INDEPENDENT_AMBULATORY_CARE_PROVIDER_SITE_OTHER): Payer: Self-pay | Admitting: Vascular Surgery

## 2016-10-31 VITALS — BP 106/74 | HR 100 | Resp 15 | Ht 74.5 in | Wt 168.0 lb

## 2016-10-31 DIAGNOSIS — N186 End stage renal disease: Secondary | ICD-10-CM

## 2016-10-31 DIAGNOSIS — Z992 Dependence on renal dialysis: Secondary | ICD-10-CM | POA: Diagnosis not present

## 2016-10-31 DIAGNOSIS — Z01818 Encounter for other preprocedural examination: Secondary | ICD-10-CM | POA: Diagnosis not present

## 2016-10-31 LAB — BLOOD GAS, VENOUS
ACID-BASE DEFICIT: 6.1 mmol/L — AB (ref 0.0–2.0)
BICARBONATE: 21.1 mmol/L (ref 20.0–28.0)
FIO2: 0.4
PATIENT TEMPERATURE: 36.5
PCO2 VEN: 47 mmHg (ref 44.0–60.0)
pH, Ven: 7.27 (ref 7.250–7.430)

## 2016-10-31 LAB — ECHOCARDIOGRAM COMPLETE
Height: 74 in
WEIGHTICAEL: 3089.97 [oz_av]

## 2016-10-31 NOTE — Progress Notes (Signed)
Subjective:    Patient ID: [name], male    DOB: March 23, 1956, 61 y.o.   MRN: [id number] Chief Complaint  Patient presents with  . Re-evaluation    Update H and P   Patient presents for preoperative visit. He is scheduled to undergo a left upper extremity AV fistula access graft creation. The patient is without complaint today. He is currently being maintained by a left IJ PermCath without complication. The patient does not have any questions about his upcoming surgery. We discussed no blood pressures, IV's or blood draws in the left upper extremity. The patient denies fever nausea or vomiting. The patient denies any chest pain shortness of breath abdominal pain or changes in bowel habits.   Review of Systems  Constitutional: Negative.   HENT: Negative.   Eyes: Negative.   Respiratory: Negative.   Cardiovascular: Negative.   Gastrointestinal: Negative.   Endocrine: Negative.   Genitourinary:       End-stage renal disease  Musculoskeletal: Negative.   Skin: Negative.   Allergic/Immunologic: Negative.   Neurological: Negative.   Hematological: Negative.   Psychiatric/Behavioral: Negative.        Objective:   Physical Exam  Constitutional: He is oriented to person, place, and time. He appears well-developed and well-nourished. No distress.  In wheelchair  HENT:  Head: Normocephalic and atraumatic.  Eyes: Conjunctivae are normal. Pupils are equal, round, and reactive to light.  Neck: Normal range of motion.  Cardiovascular: Normal rate, regular rhythm and normal heart sounds.  Exam reveals no friction rub.   No murmur heard. Pulses:      Radial pulses are 2+ on the right side, and 2+ on the left side.  Multiple scars from previous access sites noted  Pulmonary/Chest: Effort normal and breath sounds normal. No respiratory distress. He has no wheezes. He has no rales.  Left IJ PermCath noted. No infection noted.  Abdominal: Soft. He exhibits no distension. There is no  tenderness. There is no rebound and no guarding.  Musculoskeletal: Normal range of motion. He exhibits edema (Minimal edema noted bilaterally).  Neurological: He is alert and oriented to person, place, and time.  Skin: Skin is warm and dry. He is not diaphoretic.  Psychiatric: He has a normal mood and affect. His behavior is normal. Judgment and thought content normal.  Vitals reviewed.   BP 106/74 (BP Location: Left Arm)   Pulse 100   Resp 15   Ht 6' 2.5" (1.892 m)   Wt 168 lb (76.2 kg)   BMI 21.28 kg/m   Past Medical History:  Diagnosis Date  . 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 Amputee, below knee, left ([place])   . Anemia   . Asthma   . Cardiomyopathy ([place])   . CHF (congestive heart failure) ([place])   . Chronic systolic heart failure ([place])   . Complication of anesthesia    hypotension  . COPD (chronic obstructive pulmonary disease) ([place])   . Coronary artery disease   . Dialysis patient ([place])    Mon, Wed, Fri  . End stage renal disease ([place])   . GERD (gastroesophageal reflux disease)   . Headache   . History of kidney stones   . History of pulmonary embolism   . HLD (hyperlipidemia)   . HTN (hypertension)   . Hyperparathyroidism   . Myocardial infarction ([place])   . Peripheral vascular disease ([place])   . Shortness of breath dyspnea   . Sleep apnea  NO C-PAP, Patient stated in process of  "getting one"   . Tobacco dependence     Social History   Social History  . Marital status: Widowed    Spouse name: N/A  . Number of children: N/A  . Years of education: N/A   Occupational History  . Not on file.   Social History Main Topics  . Smoking status: Light Tobacco Smoker    Packs/day: 0.25    Types: Cigarettes  . Smokeless tobacco: Never Used     Comment: 5  . Alcohol use No  . Drug use: No     Comment: has used crack cocaine in past   . Sexual activity: Not Currently    Birth control/ protection: Abstinence   Other Topics Concern  . Not  on file   Social History Narrative   Engaged.     Past Surgical History:  Procedure Laterality Date  . AMPUTATION Left 05/06/2014   Procedure: AMPUTATION BELOW KNEE;  Surgeon: Elam Dutch, MD;  Location: [place];  Service: Vascular;  Laterality: Left;  . AMPUTATION Right 01/12/2015   Procedure: Foot transmetatarsal amputation;  Surgeon: Algernon Huxley, MD;  Location: [facility];  Service: Vascular;  Laterality: Right;  . APPLICATION OF WOUND VAC Right 03/01/2015   Procedure: Application of Bio-connekt graft and wound vac application to right foot ;  Surgeon: Algernon Huxley, MD;  Location: [facility];  Service: Vascular;  Laterality: Right;  . AV FISTULA PLACEMENT Left   . CARDIAC CATHETERIZATION     stent placement   . CORONARY ANGIOPLASTY    . DIALYSIS/PERMA CATHETER INSERTION N/A 07/31/2016   Procedure: Dialysis/Perma Catheter Insertion;  Surgeon: Algernon Huxley, MD;  Location: [place] [facility];  Service: Cardiovascular;  Laterality: N/A;  . LIGATION OF ARTERIOVENOUS  FISTULA Right 01/31/2016   Procedure: LIGATION OF ARTERIOVENOUS  FISTULA;  Surgeon: Algernon Huxley, MD;  Location: [facility];  Service: Vascular;  Laterality: Right;  . PERIPHERAL VASCULAR CATHETERIZATION Right 12/15/2014   Procedure: Lower Extremity Angiography;  Surgeon: Algernon Huxley, MD;  Location: [facility] [facility];  Service: Cardiovascular;  Laterality: Right;  . PERIPHERAL VASCULAR CATHETERIZATION  12/15/2014   Procedure: Lower Extremity Intervention;  Surgeon: Algernon Huxley, MD;  Location: [place] [facility];  Service: Cardiovascular;;  . PERIPHERAL VASCULAR CATHETERIZATION Right 08/14/2015   Procedure: A/V Shuntogram/Fistulagram;  Surgeon: Algernon Huxley, MD;  Location: [facility];  Service: Cardiovascular;  Laterality: Right;  . PERIPHERAL VASCULAR CATHETERIZATION N/A 08/14/2015   Procedure: A/V Shunt Intervention;  Surgeon: Algernon Huxley, MD;  Location: [place];  Service: Cardiovascular;  Laterality: N/A;  .  PERIPHERAL VASCULAR CATHETERIZATION N/A 01/11/2016   Procedure: Dialysis/Perma Catheter Insertion;  Surgeon: Algernon Huxley, MD;  Location: [place] [facility];  Service: Cardiovascular;  Laterality: N/A;  . REVISON OF ARTERIOVENOUS FISTULA Right 02/17/2016   Procedure: removal of AV fistula;  Surgeon: Serafina Mitchell, MD;  Location: [facility];  Service: Vascular;  Laterality: Right;  . REVISON OF ARTERIOVENOUS FISTULA Right 01/31/2016   Procedure: REVISON OF ARTERIOVENOUS FISTULA ( BRACHIOCEPHALIC ) W/ ARTEGRAFT;  Surgeon: Algernon Huxley, MD;  Location: [facility];  Service: Vascular;  Laterality: Right;  . TRANSMETATARSAL AMPUTATION Right 05/04/2015   Procedure: TRANSMETATARSAL AMPUTATION REVISION, great toe amputation;  Surgeon: Algernon Huxley, MD;  Location: [facility];  Service: Vascular;  Laterality: Right;    Family History  Problem Relation Age of Onset  .  Heart failure Other   . Hypertension Other   . Leukemia Other   . Diabetes Other     Allergies  Allergen Reactions  . Dust Mite Extract Other (See Comments)    Reaction: unknown       Assessment & Plan:  Patient presents for preoperative visit. He is scheduled to undergo a left upper extremity AV fistula access graft creation. The patient is without complaint today. He is currently being maintained by a left IJ PermCath without complication. The patient does not have any questions about his upcoming surgery. We discussed no blood pressures, IV's or blood draws in the left upper extremity. The patient denies fever nausea or vomiting. The patient denies any chest pain shortness of breath abdominal pain or changes in bowel habits.  1. Preop examination - New Patient is scheduled to undergo a left upper extremity AV fistula creation. Discussed the patient's upcoming surgery. All questions answered. The patient is comfortable and is willing to proceed. We discussed no blood draws, IVs or blood pressures to the left upper extremity.  2. ESRD (end  stage renal disease) on dialysis ([place]) - stable Issues currently being maintained by a left IJ PermCath. Patient will need permanent access. He is scheduled to undergo a left upper extremity AV access creation.   Current Outpatient Prescriptions on File Prior to Visit  Medication Sig Dispense Refill  . aspirin 325 MG tablet Take 325 mg by mouth every morning.     [name] atorvastatin (LIPITOR) 10 MG tablet Take 10 mg by mouth at bedtime.     . budesonide-formoterol (SYMBICORT) 160-4.5 MCG/ACT inhaler Inhale 2 puffs into the lungs 2 (two) times daily. Reported on 08/14/2015    . calcium acetate (PHOSLO) 667 MG capsule Take 667 mg by mouth 3 (three) times daily with meals.    . Darbepoetin Alfa (ARANESP) 40 MCG/0.4ML SOSY injection Inject 0.4 mLs (40 mcg total) into the vein every Monday with hemodialysis. 8.4 mL   . docusate sodium (COLACE) 100 MG capsule Take 100 mg by mouth 2 (two) times daily as needed for mild constipation. Reported on 08/14/2015    . doxercalciferol (HECTOROL) 4 MCG/2ML injection Inject 1.25 mLs (2.5 mcg total) into the vein every Monday, Wednesday, and Friday with hemodialysis. 2 mL   . ferric gluconate 125 mg in sodium chloride 0.9 % 100 mL Inject 125 mg into the vein every Monday, Wednesday, and Friday with hemodialysis.    . fluticasone (FLONASE) 50 MCG/ACT nasal spray Place 1 spray into both nostrils daily.    . Ipratropium-Albuterol (COMBIVENT IN) Inhale 2 puffs into the lungs every 6 (six) hours as needed (for wheezing/shortness of breath). COMBIVENT AEROSOL 18-103 MCG/ACT    . ipratropium-albuterol (DUONEB) 0.5-2.5 (3) MG/3ML SOLN Take 3 mLs by nebulization every 4 (four) hours as needed (SOB).    . isosorbide mononitrate (IMDUR) 30 MG 24 hr tablet Take 1 tablet (30 mg total) by mouth daily. 30 tablet 2  . multivitamin (RENA-VIT) TABS tablet Take 1 tablet by mouth at bedtime.  0  . nitroGLYCERIN (NITROSTAT) 0.4 MG SL tablet Place 0.4 mg under the tongue every 5 (five) minutes  as needed for chest pain.    . Nutritional Supplements (FEEDING SUPPLEMENT, NEPRO CARB STEADY,) LIQD Take 237 mLs by mouth daily.    [name] oxyCODONE (OXY IR/ROXICODONE) 5 MG immediate release tablet Take 5 mg by mouth every 8 (eight) hours as needed for severe pain.    [name] oxyCODONE-acetaminophen (PERCOCET/ROXICET) 5-325 MG tablet Take  1 tablet by mouth every 4 (four) hours as needed for severe pain.    . polyethylene glycol (MIRALAX / GLYCOLAX) packet Take 17 g by mouth every morning. Hold for loose stools.    . pregabalin (LYRICA) 75 MG capsule Take 75 mg by mouth 2 (two) times daily.    . sevelamer carbonate (RENVELA) 800 MG tablet Take 800 mg by mouth daily.     [name] tiotropium (SPIRIVA) 18 MCG inhalation capsule Place 18 mcg into inhaler and inhale daily.    . Vancomycin (VANCOCIN) 750-5 MG/150ML-% SOLN With each dialysis, Tuesday, Thursday and Saturday. Last dose April 3rd 4000 mL   . warfarin (COUMADIN) 2.5 MG tablet Take 2.5 mg by mouth daily. Patient takes 1 tablet(2.5 mg) by mouth once daily.     No current facility-administered medications on file prior to visit.     There are no Patient Instructions on file for this visit. No Follow-up on file.   Neesha Langton A Jera Headings, PA-C

## 2016-11-02 ENCOUNTER — Emergency Department
Admission: EM | Admit: 2016-11-02 | Discharge: 2016-11-02 | Disposition: A | Discharge status: Home / Self Care | Payer: Medicare Other | Attending: Emergency Medicine | Admitting: Emergency Medicine

## 2016-11-02 ENCOUNTER — Other Ambulatory Visit: Payer: Self-pay

## 2016-11-02 ENCOUNTER — Encounter: Payer: Self-pay | Admitting: *Deleted

## 2016-11-02 ENCOUNTER — Emergency Department: Payer: Medicare Other

## 2016-11-02 DIAGNOSIS — R0602 Shortness of breath: Secondary | ICD-10-CM | POA: Insufficient documentation

## 2016-11-02 DIAGNOSIS — I110 Hypertensive heart disease with heart failure: Secondary | ICD-10-CM | POA: Diagnosis not present

## 2016-11-02 DIAGNOSIS — R05 Cough: Secondary | ICD-10-CM | POA: Diagnosis not present

## 2016-11-02 DIAGNOSIS — I252 Old myocardial infarction: Secondary | ICD-10-CM | POA: Diagnosis not present

## 2016-11-02 DIAGNOSIS — I5022 Chronic systolic (congestive) heart failure: Secondary | ICD-10-CM | POA: Insufficient documentation

## 2016-11-02 DIAGNOSIS — N186 End stage renal disease: Secondary | ICD-10-CM | POA: Diagnosis not present

## 2016-11-02 DIAGNOSIS — R42 Dizziness and giddiness: Secondary | ICD-10-CM | POA: Insufficient documentation

## 2016-11-02 DIAGNOSIS — J449 Chronic obstructive pulmonary disease, unspecified: Secondary | ICD-10-CM | POA: Insufficient documentation

## 2016-11-02 DIAGNOSIS — J45909 Unspecified asthma, uncomplicated: Secondary | ICD-10-CM | POA: Diagnosis not present

## 2016-11-02 DIAGNOSIS — R55 Syncope and collapse: Secondary | ICD-10-CM

## 2016-11-02 DIAGNOSIS — F17210 Nicotine dependence, cigarettes, uncomplicated: Secondary | ICD-10-CM | POA: Diagnosis not present

## 2016-11-02 DIAGNOSIS — Z992 Dependence on renal dialysis: Secondary | ICD-10-CM | POA: Insufficient documentation

## 2016-11-02 DIAGNOSIS — Z7982 Long term (current) use of aspirin: Secondary | ICD-10-CM | POA: Diagnosis not present

## 2016-11-02 DIAGNOSIS — I132 Hypertensive heart and chronic kidney disease with heart failure and with stage 5 chronic kidney disease, or end stage renal disease: Secondary | ICD-10-CM | POA: Diagnosis not present

## 2016-11-02 LAB — CBC WITH DIFFERENTIAL/PLATELET
BASOS ABS: 0.1 10*3/uL (ref 0–0.1)
BASOS PCT: 1 %
EOS ABS: 0.1 10*3/uL (ref 0–0.7)
EOS PCT: 3 %
HCT: 46.7 % (ref 40.0–52.0)
Hemoglobin: 15.4 g/dL (ref 13.0–18.0)
Lymphocytes Relative: 20 %
Lymphs Abs: 1.1 10*3/uL (ref 1.0–3.6)
MCH: 29.4 pg (ref 26.0–34.0)
MCHC: 32.9 g/dL (ref 32.0–36.0)
MCV: 89.3 fL (ref 80.0–100.0)
MONO ABS: 1.7 10*3/uL — AB (ref 0.2–1.0)
Monocytes Relative: 30 %
NEUTROS ABS: 2.7 10*3/uL (ref 1.4–6.5)
Neutrophils Relative %: 46 %
PLATELETS: 139 10*3/uL — AB (ref 150–440)
RBC: 5.23 MIL/uL (ref 4.40–5.90)
RDW: 15.4 % — AB (ref 11.5–14.5)
WBC: 5.7 10*3/uL (ref 3.8–10.6)

## 2016-11-02 LAB — PROTIME-INR
INR: 1.19
PROTHROMBIN TIME: 15.2 s (ref 11.4–15.2)

## 2016-11-02 LAB — BASIC METABOLIC PANEL
ANION GAP: 16 — AB (ref 5–15)
BUN: 81 mg/dL — ABNORMAL HIGH (ref 6–20)
CALCIUM: 10 mg/dL (ref 8.9–10.3)
CO2: 20 mmol/L — ABNORMAL LOW (ref 22–32)
Chloride: 100 mmol/L — ABNORMAL LOW (ref 101–111)
Creatinine, Ser: 10.66 mg/dL — ABNORMAL HIGH (ref 0.61–1.24)
GFR, EST AFRICAN AMERICAN: 5 mL/min — AB (ref >60)
GFR, EST NON AFRICAN AMERICAN: 5 mL/min — AB (ref >60)
Glucose, Bld: 124 mg/dL — ABNORMAL HIGH (ref 65–99)
Potassium: 5.3 mmol/L — ABNORMAL HIGH (ref 3.5–5.1)
SODIUM: 136 mmol/L (ref 135–145)

## 2016-11-02 LAB — TROPONIN I: TROPONIN I: 0.16 ng/mL — AB (ref <0.03)

## 2016-11-02 LAB — BRAIN NATRIURETIC PEPTIDE: B Natriuretic Peptide: 889 pg/mL — ABNORMAL HIGH (ref 0.0–100.0)

## 2016-11-02 MED ORDER — HYDROCOD POLST-CPM POLST ER 10-8 MG/5ML PO SUER
ORAL | Status: AC
  Administered 2016-11-02: 5 mL via ORAL
  Filled 2016-11-02: qty 5

## 2016-11-02 MED ORDER — HYDROCOD POLST-CPM POLST ER 10-8 MG/5ML PO SUER
5.0000 mL | Freq: Once | ORAL | Status: AC
  Administered 2016-11-02: 5 mL via ORAL

## 2016-11-02 NOTE — ED Triage Notes (Signed)
  Pt arrived to ED via EMS after a near syncopal episode. Pt was sitting outside when EMS reports he became diaphoretic and had one episode of vomiting. Family reports pt had altered mental status prior to EMS arrival. EMS reports pt has been oriented and pt remains alert and oriented x 4 upon arrival.   Pt is hypotensive with EMS with BP of 88/64. Pt recently discharged this week from [facility] after having resp. Difficulties. Pt continues to have a cough.

## 2016-11-02 NOTE — ED Notes (Signed)
Patient transported to X-ray 

## 2016-11-02 NOTE — ED Provider Notes (Signed)
[facility] Emergency Department Provider Note       Time seen: ----------------------------------------- 8:26 PM on 11/02/2016 -----------------------------------------     I have reviewed the triage vital signs and the nursing notes.   HISTORY   Chief Complaint Near Syncope    HPI [name] is a 61 y.o. male who presents to the ED for near syncope. Patient was outside enjoying the weather when he suddenly felt near syncopal and got very hot. He has had some shortness of breath with cough. Pain is 10 out of 10 on the right and left side of his chest. Nothing makes it better, coughing seems to make it worse. Patient states he underwent normal dialysis yesterday. He denies any other injuries or complaints. In route blood pressure was initially in the 80 systolic but subsequently improved to the 90s.   Past Medical History:  Diagnosis Date  . 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 Amputee, below knee, left ([place])   . Anemia   . Asthma   . Cardiomyopathy ([place])   . CHF (congestive heart failure) ([place])   . Chronic systolic heart failure ([place])   . Complication of anesthesia    hypotension  . COPD (chronic obstructive pulmonary disease) ([place])   . Coronary artery disease   . Dialysis patient ([place])    Mon, Wed, Fri  . End stage renal disease ([name])   . GERD (gastroesophageal reflux disease)   . Headache   . History of kidney stones   . History of pulmonary embolism   . HLD (hyperlipidemia)   . HTN (hypertension)   . Hyperparathyroidism   . Myocardial infarction ([place])   . Peripheral vascular disease ([place])   . Shortness of breath dyspnea   . Sleep apnea    NO C-PAP, Patient stated in process of  "getting one"   . Tobacco dependence     Patient Active Problem List   Diagnosis Date Noted  . Acute respiratory failure ([place]) 10/28/2016  . Pulmonary embolism ([place]) 08/03/2016  . NSTEMI (non-ST elevated myocardial infarction) ([place])  07/26/2016  . Acute respiratory failure with hypoxia ([place]) 05/24/2016  . Right lower lobe pneumonia ([place]) 05/24/2016  . Elevated troponin 05/24/2016  . Pulmonary edema 05/21/2016  . Kidney dialysis as the cause of abnormal reaction of the patient, or of later complication, without mention of misadventure at the time of the procedure (CODE) 03/05/2016  . PVD (peripheral vascular disease) ([place]) 03/05/2016  . Preop examination 05/02/2015  . Chronic systolic heart failure ([place])   . Sepsis ([place]) 01/13/2015  . Acute encephalopathy 01/13/2015  . Hyperkalemia 01/13/2015  . Gangrene of foot ([place]) 05/04/2014  . ESRD (end stage renal disease) on dialysis ([place]) 05/04/2014  . CAD (coronary artery disease) 05/04/2014  . Normocytic anemia 05/04/2014  . Hyperlipidemia 04/26/2010  . HYPERTENSION, BENIGN 04/26/2010  . CAD, NATIVE VESSEL 04/26/2010  . End stage renal disease ([place]) 04/26/2010    Past Surgical History:  Procedure Laterality Date  . AMPUTATION Left 05/06/2014   Procedure: AMPUTATION BELOW KNEE;  Surgeon: Elam Dutch, MD;  Location: [place];  Service: Vascular;  Laterality: Left;  . AMPUTATION Right 01/12/2015   Procedure: Foot transmetatarsal amputation;  Surgeon: Algernon Huxley, MD;  Location: [facility];  Service: Vascular;  Laterality: Right;  . APPLICATION OF WOUND VAC Right 03/01/2015   Procedure: Application of Bio-connekt graft and wound vac application to right foot ;  Surgeon: Algernon Huxley, MD;  Location: [facility];  Service: Vascular;  Laterality: Right;  . AV FISTULA PLACEMENT Left   . CARDIAC CATHETERIZATION     stent placement   . CORONARY ANGIOPLASTY    . DIALYSIS/PERMA CATHETER INSERTION N/A 07/31/2016   Procedure: Dialysis/Perma Catheter Insertion;  Surgeon: Algernon Huxley, MD;  Location: [place] [facility];  Service: Cardiovascular;  Laterality: N/A;  . LIGATION OF ARTERIOVENOUS  FISTULA Right 01/31/2016   Procedure: LIGATION OF ARTERIOVENOUS  FISTULA;  Surgeon: Algernon Huxley, MD;   Location: [facility];  Service: Vascular;  Laterality: Right;  . PERIPHERAL VASCULAR CATHETERIZATION Right 12/15/2014   Procedure: Lower Extremity Angiography;  Surgeon: Algernon Huxley, MD;  Location: [place] [facility];  Service: Cardiovascular;  Laterality: Right;  . PERIPHERAL VASCULAR CATHETERIZATION  12/15/2014   Procedure: Lower Extremity Intervention;  Surgeon: Algernon Huxley, MD;  Location: [place] [facility];  Service: Cardiovascular;;  . PERIPHERAL VASCULAR CATHETERIZATION Right 08/14/2015   Procedure: A/V Shuntogram/Fistulagram;  Surgeon: Algernon Huxley, MD;  Location: [place] [facility];  Service: Cardiovascular;  Laterality: Right;  . PERIPHERAL VASCULAR CATHETERIZATION N/A 08/14/2015   Procedure: A/V Shunt Intervention;  Surgeon: Algernon Huxley, MD;  Location: [place] [facility];  Service: Cardiovascular;  Laterality: N/A;  . PERIPHERAL VASCULAR CATHETERIZATION N/A 01/11/2016   Procedure: Dialysis/Perma Catheter Insertion;  Surgeon: Algernon Huxley, MD;  Location: [place] [facility];  Service: Cardiovascular;  Laterality: N/A;  . REVISON OF ARTERIOVENOUS FISTULA Right 02/17/2016   Procedure: removal of AV fistula;  Surgeon: Serafina Mitchell, MD;  Location: [facility];  Service: Vascular;  Laterality: Right;  . REVISON OF ARTERIOVENOUS FISTULA Right 01/31/2016   Procedure: REVISON OF ARTERIOVENOUS FISTULA ( BRACHIOCEPHALIC ) W/ ARTEGRAFT;  Surgeon: Algernon Huxley, MD;  Location: [facility];  Service: Vascular;  Laterality: Right;  . TRANSMETATARSAL AMPUTATION Right 05/04/2015   Procedure: TRANSMETATARSAL AMPUTATION REVISION, great toe amputation;  Surgeon: Algernon Huxley, MD;  Location: [facility];  Service: Vascular;  Laterality: Right;    Allergies Dust mite extract  Social History Social History  Substance Use Topics  . Smoking status: Light Tobacco Smoker    Packs/day: 0.25    Types: Cigarettes  . Smokeless tobacco: Never Used     Comment: 5  . Alcohol use No    Review of  Systems Constitutional: Negative for fever. Eyes: Negative for vision changes ENT:  Negative for congestion, sore throat Cardiovascular: Positive for chest pain Respiratory: Positive for cough Gastrointestinal: Negative for abdominal pain, vomiting and diarrhea. Genitourinary: Negative for dysuria. Musculoskeletal: Negative for back pain. Skin: Negative for rash. Neurological: Negative for headaches,Positive for weakness  All systems negative/normal/unremarkable except as stated in the HPI  ____________________________________________   PHYSICAL EXAM:  VITAL SIGNS: ED Triage Vitals [11/02/16 2025]  Enc Vitals Group     BP      Pulse      Resp      Temp      Temp src      SpO2      Weight      Height      Head Circumference      Peak Flow      Pain Score 10     Pain Loc      Pain Edu?      Excl. in [place]?     Constitutional: Alert and oriented. Well appearing and in no distress. Eyes: Conjunctivae are normal. Normal extraocular movements. ENT   Head:  Normocephalic and atraumatic.   Nose: No congestion/rhinnorhea.   Mouth/Throat: Mucous membranes are moist.   Neck: No stridor. Cardiovascular: Normal rate, regular rhythm. No murmurs, rubs, or gallops. Respiratory: Normal respiratory effort without tachypnea nor retractions. Scattered rhonchi, or cough noted Gastrointestinal: Soft and nontender. Normal bowel sounds Musculoskeletal: Nontender with normal range of motion in extremities. Left below-the-knee amputation Neurologic:  Normal speech and language. No gross focal neurologic deficits are appreciated.  Skin:  Skin is warm, dry and intact. No rash noted. Psychiatric: Mood and affect are normal. Speech and behavior are normal.  ____________________________________________  EKG: Interpreted by me. Sinus tachycardia rate of 101 bpm, normal PR interval, white QRS, normal QT, left bundle branch block  ____________________________________________  ED  COURSE:  Pertinent labs & imaging results that were available during my care of the patient were reviewed by me and considered in my medical decision making (see chart for details). Patient presents for cough and near syncope, we will assess with labs and imaging as indicated.   Procedures ____________________________________________   LABS (pertinent positives/negatives)  Labs Reviewed  CBC WITH DIFFERENTIAL/PLATELET - Abnormal; Notable for the following:       Result Value   RDW 15.4 (*)    Platelets 139 (*)    Monocytes Absolute 1.7 (*)    All other components within normal limits  BASIC METABOLIC PANEL - Abnormal; Notable for the following:    Potassium 5.3 (*)    Chloride 100 (*)    CO2 20 (*)    Glucose, Bld 124 (*)    BUN 81 (*)    Creatinine, Ser 10.66 (*)    GFR calc non Af Amer 5 (*)    GFR calc Af Amer 5 (*)    Anion gap 16 (*)    All other components within normal limits  BRAIN NATRIURETIC PEPTIDE - Abnormal; Notable for the following:    B Natriuretic Peptide 889.0 (*)    All other components within normal limits  TROPONIN I - Abnormal; Notable for the following:    Troponin I 0.16 (*)    All other components within normal limits  PROTIME-INR    RADIOLOGY Images were viewed by me  Chest x-ray IMPRESSION: Small right pleural effusion/thickening with associated right lower lobe scarring, chronic.  No frank interstitial edema. ____________________________________________  FINAL ASSESSMENT AND PLAN  Near syncope  Plan: Patient's labs and imaging were dictated above. Patient had presented for near syncope likely from transient hypotension. It is unclear if he had a vagal event or became overheated or perhaps a combination of both. His labs are stable for him, he is at his baseline with a normal blood pressure. He is agreeable to outpatient follow-up.   Earleen Newport, MD   Note: This note was generated in part or whole with voice recognition  software. Voice recognition is usually quite accurate but there are transcription errors that can and very often do occur. I apologize for any typographical errors that were not detected and corrected.     Earleen Newport, MD 11/02/16 2145

## 2016-11-02 NOTE — ED Notes (Signed)
Vital signs stable. 

## 2016-11-04 ENCOUNTER — Other Ambulatory Visit (INDEPENDENT_AMBULATORY_CARE_PROVIDER_SITE_OTHER): Payer: Self-pay | Admitting: Vascular Surgery

## 2016-11-07 ENCOUNTER — Encounter
Admission: RE | Admit: 2016-11-07 | Discharge: 2016-11-07 | Disposition: A | Discharge status: Home / Self Care | Payer: Medicare Other | Source: Ambulatory Visit | Attending: Vascular Surgery | Admitting: Vascular Surgery

## 2016-11-07 ENCOUNTER — Telehealth (INDEPENDENT_AMBULATORY_CARE_PROVIDER_SITE_OTHER): Payer: Self-pay

## 2016-11-07 ENCOUNTER — Other Ambulatory Visit (INDEPENDENT_AMBULATORY_CARE_PROVIDER_SITE_OTHER): Payer: Self-pay | Admitting: Vascular Surgery

## 2016-11-07 DIAGNOSIS — Z01812 Encounter for preprocedural laboratory examination: Secondary | ICD-10-CM | POA: Diagnosis not present

## 2016-11-07 LAB — CBC WITH DIFFERENTIAL/PLATELET
BASOS ABS: 0 10*3/uL (ref 0–0.1)
BASOS PCT: 1 %
EOS PCT: 3 %
Eosinophils Absolute: 0.2 10*3/uL (ref 0–0.7)
HCT: 42.4 % (ref 40.0–52.0)
Hemoglobin: 13.8 g/dL (ref 13.0–18.0)
LYMPHS PCT: 29 %
Lymphs Abs: 1.8 10*3/uL (ref 1.0–3.6)
MCH: 28.6 pg (ref 26.0–34.0)
MCHC: 32.5 g/dL (ref 32.0–36.0)
MCV: 87.9 fL (ref 80.0–100.0)
MONO ABS: 1.2 10*3/uL — AB (ref 0.2–1.0)
Monocytes Relative: 19 %
Neutro Abs: 3.1 10*3/uL (ref 1.4–6.5)
Neutrophils Relative %: 48 %
PLATELETS: 165 10*3/uL (ref 150–440)
RBC: 4.83 MIL/uL (ref 4.40–5.90)
RDW: 15 % — AB (ref 11.5–14.5)
WBC: 6.3 10*3/uL (ref 3.8–10.6)

## 2016-11-07 LAB — TYPE AND SCREEN
ABO/RH(D): B POS
Antibody Screen: NEGATIVE

## 2016-11-07 LAB — BASIC METABOLIC PANEL
Anion gap: 15 (ref 5–15)
BUN: 80 mg/dL — AB (ref 6–20)
CALCIUM: 9.4 mg/dL (ref 8.9–10.3)
CO2: 17 mmol/L — ABNORMAL LOW (ref 22–32)
Chloride: 101 mmol/L (ref 101–111)
Creatinine, Ser: 11.54 mg/dL — ABNORMAL HIGH (ref 0.61–1.24)
GFR calc Af Amer: 5 mL/min — ABNORMAL LOW (ref >60)
GFR, EST NON AFRICAN AMERICAN: 4 mL/min — AB (ref >60)
GLUCOSE: 80 mg/dL (ref 65–99)
Potassium: 6.2 mmol/L — ABNORMAL HIGH (ref 3.5–5.1)
Sodium: 133 mmol/L — ABNORMAL LOW (ref 135–145)

## 2016-11-07 LAB — APTT: APTT: 40 s — AB (ref 24–36)

## 2016-11-07 LAB — PROTIME-INR
INR: 1.88
Prothrombin Time: 21.9 seconds — ABNORMAL HIGH (ref 11.4–15.2)

## 2016-11-07 MED ORDER — VANCOMYCIN HCL 10 G IV SOLR: 1000.0000 mg | Freq: Once | INTRAVENOUS | Status: DC

## 2016-11-07 NOTE — Telephone Encounter (Signed)
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

## 2016-11-07 NOTE — Patient Instructions (Addendum)
Your procedure is scheduled on: Thursday 11/14/16 Report to [facility]. 2ND FLOOR MEDICAL MALL ENTRANCE. To find out your arrival time please call [phone] between 1PM - 3PM on Wednesday 11/13/16.  Remember: Instructions that are not followed completely may result in serious medical risk, up to and including death, or upon the discretion of your surgeon and anesthesiologist your surgery may need to be rescheduled.    __X__ 1. Do not eat food or drink liquids after midnight. No gum chewing or hard candies.     __X__ 2. No Alcohol for 24 hours before or after surgery.   ____ 3. Bring all medications with you on the day of surgery if instructed.    __X__ 4. Notify your doctor if there is any change in your medical condition     (cold, fever, infections).             __X___5. No smoking within 24 hours of your surgery.     Do not wear jewelry, make-up, hairpins, clips or nail polish.  Do not wear lotions, powders, or perfumes.   Do not shave 48 hours prior to surgery. Men may shave face and neck.  Do not bring valuables to the hospital.    [facility] is not responsible for any belongings or valuables.               Contacts, dentures or bridgework may not be worn into surgery.I  Leave your suitcase in the car. After surgery it may be brought to your room.  For patients admitted to the hospital, discharge time is determined by your                treatment team.   Patients discharged the day of surgery will not be allowed to drive home.   Please read over the following fact sheets that you were given:   MRSA Information   __X__ Take these medicines the morning of surgery with A SIP OF WATER:    1. ISOSORBIDE  2. PROTONIX  3. LYRICA  4.  5.  6.  ____ Fleet Enema (as directed)   __X__ Use CHG Soap as directed USE SAGE WIPES  __X__ Use ALL inhalers on the day of surgery AND A DUONEB TREATMENT BEFORE ARRIVAL  ____ Stop metformin 2 days prior to surgery    ____ Take 1/2 of  usual insulin dose the night before surgery and none on the morning of surgery.   __X__ Stop Coumadin/4 DAYS BEFORE PROCEDURE CONTINUE ASPIRIN   __X__ Stop Anti-inflammatories such as Advil, Aleve, Ibuprofen, Motrin, Naproxen, Naprosyn, Goodies,powder, .  OK to take Tylenol.   ____ Stop supplements until after surgery.    ____ Bring C-Pap to the hospital.

## 2016-11-07 NOTE — Pre-Procedure Instructions (Addendum)
Echo 10/29/16  Did not repeat EKG patient had one 11/03/16. LBBB noted on previous EKG 10/28/16 and 05/04/2014

## 2016-11-10 ENCOUNTER — Emergency Department: Payer: Medicare Other

## 2016-11-10 ENCOUNTER — Inpatient Hospital Stay
Admission: EM | Admit: 2016-11-10 | Discharge: 2016-11-14 | DRG: 871 | Disposition: A | Discharge status: Home / Self Care | Payer: Medicare Other | Attending: Internal Medicine | Admitting: Internal Medicine

## 2016-11-10 ENCOUNTER — Other Ambulatory Visit: Payer: Self-pay

## 2016-11-10 DIAGNOSIS — Z7982 Long term (current) use of aspirin: Secondary | ICD-10-CM

## 2016-11-10 DIAGNOSIS — E785 Hyperlipidemia, unspecified: Secondary | ICD-10-CM | POA: Diagnosis present

## 2016-11-10 DIAGNOSIS — G629 Polyneuropathy, unspecified: Secondary | ICD-10-CM | POA: Diagnosis present

## 2016-11-10 DIAGNOSIS — R0602 Shortness of breath: Secondary | ICD-10-CM | POA: Diagnosis not present

## 2016-11-10 DIAGNOSIS — J189 Pneumonia, unspecified organism: Secondary | ICD-10-CM | POA: Diagnosis present

## 2016-11-10 DIAGNOSIS — I739 Peripheral vascular disease, unspecified: Secondary | ICD-10-CM | POA: Diagnosis present

## 2016-11-10 DIAGNOSIS — Z992 Dependence on renal dialysis: Secondary | ICD-10-CM

## 2016-11-10 DIAGNOSIS — J9690 Respiratory failure, unspecified, unspecified whether with hypoxia or hypercapnia: Secondary | ICD-10-CM

## 2016-11-10 DIAGNOSIS — J9691 Respiratory failure, unspecified with hypoxia: Secondary | ICD-10-CM | POA: Diagnosis present

## 2016-11-10 DIAGNOSIS — J9601 Acute respiratory failure with hypoxia: Secondary | ICD-10-CM | POA: Diagnosis present

## 2016-11-10 DIAGNOSIS — I5043 Acute on chronic combined systolic (congestive) and diastolic (congestive) heart failure: Secondary | ICD-10-CM | POA: Diagnosis present

## 2016-11-10 DIAGNOSIS — Z7901 Long term (current) use of anticoagulants: Secondary | ICD-10-CM

## 2016-11-10 DIAGNOSIS — I2510 Atherosclerotic heart disease of native coronary artery without angina pectoris: Secondary | ICD-10-CM | POA: Diagnosis present

## 2016-11-10 DIAGNOSIS — J90 Pleural effusion, not elsewhere classified: Secondary | ICD-10-CM

## 2016-11-10 DIAGNOSIS — N186 End stage renal disease: Secondary | ICD-10-CM | POA: Diagnosis present

## 2016-11-10 DIAGNOSIS — I252 Old myocardial infarction: Secondary | ICD-10-CM

## 2016-11-10 DIAGNOSIS — A419 Sepsis, unspecified organism: Secondary | ICD-10-CM | POA: Diagnosis not present

## 2016-11-10 DIAGNOSIS — Z792 Long term (current) use of antibiotics: Secondary | ICD-10-CM

## 2016-11-10 DIAGNOSIS — R778 Other specified abnormalities of plasma proteins: Secondary | ICD-10-CM

## 2016-11-10 DIAGNOSIS — E213 Hyperparathyroidism, unspecified: Secondary | ICD-10-CM | POA: Diagnosis present

## 2016-11-10 DIAGNOSIS — Z86711 Personal history of pulmonary embolism: Secondary | ICD-10-CM

## 2016-11-10 DIAGNOSIS — Z79899 Other long term (current) drug therapy: Secondary | ICD-10-CM

## 2016-11-10 DIAGNOSIS — I132 Hypertensive heart and chronic kidney disease with heart failure and with stage 5 chronic kidney disease, or end stage renal disease: Secondary | ICD-10-CM | POA: Diagnosis present

## 2016-11-10 DIAGNOSIS — R059 Cough, unspecified: Secondary | ICD-10-CM

## 2016-11-10 DIAGNOSIS — Z89512 Acquired absence of left leg below knee: Secondary | ICD-10-CM

## 2016-11-10 DIAGNOSIS — N2581 Secondary hyperparathyroidism of renal origin: Secondary | ICD-10-CM | POA: Diagnosis present

## 2016-11-10 DIAGNOSIS — Y95 Nosocomial condition: Secondary | ICD-10-CM | POA: Diagnosis present

## 2016-11-10 DIAGNOSIS — R05 Cough: Secondary | ICD-10-CM

## 2016-11-10 DIAGNOSIS — R7989 Other specified abnormal findings of blood chemistry: Secondary | ICD-10-CM

## 2016-11-10 DIAGNOSIS — K219 Gastro-esophageal reflux disease without esophagitis: Secondary | ICD-10-CM | POA: Diagnosis present

## 2016-11-10 LAB — CBC
HEMATOCRIT: 41 % (ref 40.0–52.0)
Hemoglobin: 13.6 g/dL (ref 13.0–18.0)
MCH: 29.1 pg (ref 26.0–34.0)
MCHC: 33.2 g/dL (ref 32.0–36.0)
MCV: 87.7 fL (ref 80.0–100.0)
Platelets: 186 10*3/uL (ref 150–440)
RBC: 4.67 MIL/uL (ref 4.40–5.90)
RDW: 14.8 % — AB (ref 11.5–14.5)
WBC: 10.4 10*3/uL (ref 3.8–10.6)

## 2016-11-10 LAB — BLOOD GAS, VENOUS
Acid-base deficit: 7 mmol/L — ABNORMAL HIGH (ref 0.0–2.0)
BICARBONATE: 18.6 mmol/L — AB (ref 20.0–28.0)
FIO2: 0.21
O2 SAT: 79.1 %
PATIENT TEMPERATURE: 37
pCO2, Ven: 37 mmHg — ABNORMAL LOW (ref 44.0–60.0)
pH, Ven: 7.31 (ref 7.250–7.430)
pO2, Ven: 48 mmHg — ABNORMAL HIGH (ref 32.0–45.0)

## 2016-11-10 MED ORDER — RACEPINEPHRINE HCL 2.25 % IN NEBU
INHALATION_SOLUTION | RESPIRATORY_TRACT | Status: AC
  Administered 2016-11-10: 0.5 mL via RESPIRATORY_TRACT
  Filled 2016-11-10: qty 0.5

## 2016-11-10 MED ORDER — RACEPINEPHRINE HCL 2.25 % IN NEBU
0.5000 mL | INHALATION_SOLUTION | Freq: Once | RESPIRATORY_TRACT | Status: AC
  Administered 2016-11-10: 0.5 mL via RESPIRATORY_TRACT

## 2016-11-10 NOTE — ED Triage Notes (Signed)
EMS pt to rm 6 from home with report of shortness of breath for 3 days. Started one week ago with productive cough with green sputum. Pt given duoneb by ems.

## 2016-11-10 NOTE — ED Provider Notes (Signed)
[facility] Emergency Department Provider Note   ____________________________________________   First MD Initiated Contact with Patient 11/10/16 2320     (approximate)  I have reviewed the triage vital signs and the nursing notes.   HISTORY  Chief Complaint Shortness of Breath    HPI [name] is a 61 y.o. male who comes into the hospital today with some shortness of breath. According to EMS the patient has had a cough for the past week with some green sputum production. He has been feeling short of breath for the past 3 days. The patient has a history of COPD, CHF and end-stage renal disease on dialysis. The patient states that he does not have any inhalers at home to help with his COPD exacerbations. The patient receives dialysis Monday Wednesday and Friday and denies missing any sessions. He does have a history of pulmonary edema. According to EMS he had wheezing all over so they gave him a DuoNeb, albuterol and Solu-Medrol. The patient also reports he has a headache and he had a fever 2 days ago. He has chest pain only when he coughs and flank pain because of his coughing. The patient was brought in by EMS for evaluation of his symptoms.   Past Medical History:  Diagnosis Date  . 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 Amputee, below knee, left ([place])   . Anemia   . Asthma   . Cardiomyopathy ([place])   . CHF (congestive heart failure) ([place])   . Chronic systolic heart failure ([place])   . Complication of anesthesia    hypotension  . COPD (chronic obstructive pulmonary disease) ([place])   . Coronary artery disease   . Dialysis patient ([place])    Mon, Wed, Fri  . End stage renal disease ([place])   . GERD (gastroesophageal reflux disease)   . Headache   . History of kidney stones   . History of pulmonary embolism   . HLD (hyperlipidemia)   . HTN (hypertension)   . Hyperparathyroidism   . Myocardial infarction ([place])   . Peripheral vascular  disease ([place])   . Shortness of breath dyspnea   . Sleep apnea    NO C-PAP, Patient stated in process of  "getting one"   . Tobacco dependence     Patient Active Problem List   Diagnosis Date Noted  . Acute respiratory failure ([place]) 10/28/2016  . Pulmonary embolism ([place]) 08/03/2016  . NSTEMI (non-ST elevated myocardial infarction) ([place]) 07/26/2016  . Acute respiratory failure with hypoxia ([place]) 05/24/2016  . Right lower lobe pneumonia ([place]) 05/24/2016  . Elevated troponin 05/24/2016  . Pulmonary edema 05/21/2016  . Kidney dialysis as the cause of abnormal reaction of the patient, or of later complication, without mention of misadventure at the time of the procedure (CODE) 03/05/2016  . PVD (peripheral vascular disease) ([place]) 03/05/2016  . Preop examination 05/02/2015  . Chronic systolic heart failure ([place])   . Sepsis ([place]) 01/13/2015  . Acute encephalopathy 01/13/2015  . Hyperkalemia 01/13/2015  . Gangrene of foot ([place]) 05/04/2014  . ESRD (end stage renal disease) on dialysis ([place]) 05/04/2014  . CAD (coronary artery disease) 05/04/2014  . Normocytic anemia 05/04/2014  . Hyperlipidemia 04/26/2010  . HYPERTENSION, BENIGN 04/26/2010  . CAD, NATIVE VESSEL 04/26/2010  . End stage renal disease ([place]) 04/26/2010    Past Surgical History:  Procedure Laterality Date  . AMPUTATION Left 05/06/2014   Procedure: AMPUTATION BELOW KNEE;  Surgeon: Elam Dutch,  MD;  Location: [facility];  Service: Vascular;  Laterality: Left;  . AMPUTATION Right 01/12/2015   Procedure: Foot transmetatarsal amputation;  Surgeon: Algernon Huxley, MD;  Location: [facility];  Service: Vascular;  Laterality: Right;  . APPLICATION OF WOUND VAC Right 03/01/2015   Procedure: Application of Bio-connekt graft and wound vac application to right foot ;  Surgeon: Algernon Huxley, MD;  Location: [facility];  Service: Vascular;  Laterality: Right;  . AV FISTULA PLACEMENT Left   . CARDIAC CATHETERIZATION     stent placement   . CORONARY  ANGIOPLASTY    . DIALYSIS/PERMA CATHETER INSERTION N/A 07/31/2016   Procedure: Dialysis/Perma Catheter Insertion;  Surgeon: Algernon Huxley, MD;  Location: [facility];  Service: Cardiovascular;  Laterality: N/A;  . LIGATION OF ARTERIOVENOUS  FISTULA Right 01/31/2016   Procedure: LIGATION OF ARTERIOVENOUS  FISTULA;  Surgeon: Algernon Huxley, MD;  Location: [facility];  Service: Vascular;  Laterality: Right;  . PERIPHERAL VASCULAR CATHETERIZATION Right 12/15/2014   Procedure: Lower Extremity Angiography;  Surgeon: Algernon Huxley, MD;  Location: [facility];  Service: Cardiovascular;  Laterality: Right;  . PERIPHERAL VASCULAR CATHETERIZATION  12/15/2014   Procedure: Lower Extremity Intervention;  Surgeon: Algernon Huxley, MD;  Location: [facility];  Service: Cardiovascular;;  . PERIPHERAL VASCULAR CATHETERIZATION Right 08/14/2015   Procedure: A/V Shuntogram/Fistulagram;  Surgeon: Algernon Huxley, MD;  Location: [facility];  Service: Cardiovascular;  Laterality: Right;  . PERIPHERAL VASCULAR CATHETERIZATION N/A 08/14/2015   Procedure: A/V Shunt Intervention;  Surgeon: Algernon Huxley, MD;  Location: [facility];  Service: Cardiovascular;  Laterality: N/A;  . PERIPHERAL VASCULAR CATHETERIZATION N/A 01/11/2016   Procedure: Dialysis/Perma Catheter Insertion;  Surgeon: Algernon Huxley, MD;  Location: [facility];  Service: Cardiovascular;  Laterality: N/A;  . REVISON OF ARTERIOVENOUS FISTULA Right 02/17/2016   Procedure: removal of AV fistula;  Surgeon: Serafina Mitchell, MD;  Location: [facility];  Service: Vascular;  Laterality: Right;  . REVISON OF ARTERIOVENOUS FISTULA Right 01/31/2016   Procedure: REVISON OF ARTERIOVENOUS FISTULA ( BRACHIOCEPHALIC ) W/ ARTEGRAFT;  Surgeon: Algernon Huxley, MD;  Location: [facility];  Service: Vascular;  Laterality: Right;  . TRANSMETATARSAL AMPUTATION Right 05/04/2015   Procedure: TRANSMETATARSAL AMPUTATION REVISION, great toe amputation;  Surgeon: Algernon Huxley,  MD;  Location: [facility];  Service: Vascular;  Laterality: Right;    Prior to Admission medications   Medication Sig Start Date End Date Taking? Authorizing Provider  acetaminophen (TYLENOL) 500 MG tablet Take 1,000 mg by mouth every 6 (six) hours as needed.    [provider]  aspirin 325 MG tablet Take 325 mg by mouth every morning.     [provider]  atorvastatin (LIPITOR) 10 MG tablet Take 10 mg by mouth at bedtime.     [provider]  budesonide-formoterol (SYMBICORT) 160-4.5 MCG/ACT inhaler Inhale 2 puffs into the lungs 2 (two) times daily. Reported on 08/14/2015    [provider]  calcium acetate (PHOSLO) 667 MG capsule Take 667 mg by mouth 3 (three) times daily with meals.    [provider]  Darbepoetin Alfa (ARANESP) 40 MCG/0.4ML SOSY injection Inject 0.4 mLs (40 mcg total) into the vein every Monday with hemodialysis. 05/16/14   Ghimire, Henreitta Leber, MD  docusate sodium (COLACE) 100 MG capsule Take 100 mg by mouth 2 (two) times daily as needed for mild constipation. Reported on 08/14/2015  [provider]  doxercalciferol (HECTOROL) 4 MCG/2ML injection Inject 1.25 mLs (2.5 mcg total) into the vein every Monday, Wednesday, and Friday with hemodialysis. 05/10/14   Ghimire, Henreitta Leber, MD  ferric gluconate 125 mg in sodium chloride 0.9 % 100 mL Inject 125 mg into the vein every Monday, Wednesday, and Friday with hemodialysis. 05/10/14   Ghimire, Henreitta Leber, MD  fluticasone (FLONASE) 50 MCG/ACT nasal spray Place 1 spray into both nostrils daily.    [provider]  Ipratropium-Albuterol (COMBIVENT IN) Inhale 1 puff into the lungs every 6 (six) hours as needed (for wheezing/shortness of breath). COMBIVENT AEROSOL 18-103 MCG/ACT     [provider]  ipratropium-albuterol (DUONEB) 0.5-2.5 (3) MG/3ML SOLN Take 3 mLs by nebulization every 4 (four) hours as needed (SOB).    [provider]  isosorbide mononitrate  (IMDUR) 30 MG 24 hr tablet Take 1 tablet (30 mg total) by mouth daily. 08/22/16   Demetrios Loll, MD  midodrine (PROAMATINE) 5 MG tablet 5 mg 2 (two) times daily with a meal.  10/28/16   [provider]  multivitamin (RENA-VIT) TABS tablet Take 1 tablet by mouth at bedtime. 05/10/14   Ghimire, Henreitta Leber, MD  nitroGLYCERIN (NITROSTAT) 0.4 MG SL tablet Place 0.4 mg under the tongue every 5 (five) minutes as needed for chest pain.    [provider]  Nutritional Supplements (FEEDING SUPPLEMENT, NEPRO CARB STEADY,) LIQD Take 237 mLs by mouth daily.    [provider]  oxyCODONE (OXY IR/ROXICODONE) 5 MG immediate release tablet Take 5 mg by mouth every 8 (eight) hours as needed for severe pain.    [provider]  oxyCODONE-acetaminophen (PERCOCET/ROXICET) 5-325 MG tablet Take 1 tablet by mouth every 4 (four) hours as needed for severe pain.    [provider]  pantoprazole (PROTONIX) 20 MG tablet Take 20 mg by mouth daily.    [provider]  polyethylene glycol (MIRALAX / GLYCOLAX) packet Take 17 g by mouth every morning. Hold for loose stools.    [provider]  pregabalin (LYRICA) 75 MG capsule Take 75 mg by mouth 2 (two) times daily.    [provider]  sevelamer carbonate (RENVELA) 800 MG tablet Take 800 mg by mouth daily.     [provider]  simvastatin (ZOCOR) 20 MG tablet  09/24/16   [provider]  tiotropium (SPIRIVA) 18 MCG inhalation capsule Place 18 mcg into inhaler and inhale daily.    [provider]  Vancomycin Zollie Pee) 750-5 MG/150ML-% SOLN With each dialysis, Tuesday, Thursday and Saturday. Last dose April 3rd 07/31/16   Loletha Grayer, MD  warfarin (COUMADIN) 2.5 MG tablet Take 2.5 mg by mouth daily.     [provider]  warfarin (COUMADIN) 3 MG tablet Take 3 mg by mouth daily.    [provider]    Allergies Dust mite extract  Family History  Problem Relation Age of  Onset  . Heart failure Other   . Hypertension Other   . Leukemia Other   . Diabetes Other     Social History Social History  Substance Use Topics  . Smoking status: Light Tobacco Smoker    Packs/day: 0.25    Types: Cigarettes  . Smokeless tobacco: Never Used     Comment: 5  . Alcohol use No    Review of Systems  Constitutional: fever Eyes: No visual changes. ENT: No sore throat. Cardiovascular: Denies chest pain. Respiratory: Cough and shortness of breath. Gastrointestinal: No abdominal  pain.  No nausea, no vomiting.  No diarrhea.  No constipation. Genitourinary: Negative for dysuria. Musculoskeletal: Negative for back pain. Skin: Negative for rash. Neurological: Headache   ____________________________________________   PHYSICAL EXAM:  VITAL SIGNS: ED Triage Vitals  Enc Vitals Group     BP 11/10/16 2326 (!) 130/93     Pulse Rate 11/10/16 2326 (!) 115     Resp 11/10/16 2326 (!) 26     Temp 11/10/16 2326 99.1 F (37.3 C)     Temp Source 11/10/16 2326 Oral     SpO2 11/10/16 2323 100 %     Weight 11/10/16 2327 168 lb (76.2 kg)     Height 11/10/16 2327 6\' 2"  (1.88 m)     Head Circumference --      Peak Flow --      Pain Score 11/10/16 2325 8     Pain Loc --      Pain Edu? --      Excl. in [place]? --     Constitutional: Alert and oriented. Ill appearing and in moderate respiratory distress. Eyes: Conjunctivae are normal. PERRL. EOMI. Head: Atraumatic. Nose: No congestion/rhinnorhea. Mouth/Throat: Mucous membranes are moist.  Oropharynx non-erythematous. Cardiovascular: Normal rate, regular rhythm. Grossly normal heart sounds.  Good peripheral circulation. Respiratory: Increased respiratory effort.  No retractions. Coarse breath sounds in all lung fields with a barky sounding cough Gastrointestinal: Soft and nontender. No distention. Positive bowel sounds Musculoskeletal: No lower extremity tenderness nor edema.   Neurologic:  Normal speech and language.  Skin:   Skin is warm, dry and intact.  Psychiatric: Mood and affect are normal.   ____________________________________________   LABS (all labs ordered are listed, but only abnormal results are displayed)  Labs Reviewed  CBC - Abnormal; Notable for the following:       Result Value   RDW 14.8 (*)    All other components within normal limits  BASIC METABOLIC PANEL - Abnormal; Notable for the following:    Potassium 5.9 (*)    CO2 19 (*)    BUN 94 (*)    Creatinine, Ser 12.83 (*)    GFR calc non Af Amer 4 (*)    GFR calc Af Amer 4 (*)    Anion gap 16 (*)    All other components within normal limits  TROPONIN I - Abnormal; Notable for the following:    Troponin I 0.24 (*)    All other components within normal limits  BLOOD GAS, VENOUS - Abnormal; Notable for the following:    pCO2, Ven 37 (*)    pO2, Ven 48.0 (*)    Bicarbonate 18.6 (*)    Acid-base deficit 7.0 (*)    All other components within normal limits  BRAIN NATRIURETIC PEPTIDE - Abnormal; Notable for the following:    B Natriuretic Peptide >4,500.0 (*)    All other components within normal limits  CULTURE, BLOOD (ROUTINE X 2)  CULTURE, BLOOD (ROUTINE X 2)   ____________________________________________  EKG  ED ECG REPORT I, Loney Hering, the attending physician, personally viewed and interpreted this ECG.   Date: 11/10/2016  EKG Time: 2325  Rate: 115  Rhythm: sinus tachycardia, LBBB  Axis: normal  Intervals:left bundle branch block  ST&T Change: LBBB seen on previous EKGs  ____________________________________________  RADIOLOGY  Dg Chest Portable 1 View  Result Date: 11/10/2016 CLINICAL DATA:  Acute onset of shortness of breath.  Cough. EXAM: PORTABLE CHEST 1 VIEW COMPARISON:  11/02/2016 FINDINGS: Large bore  central venous catheter in stable position. Vascular stent noted. Markedly enlarged cardiac silhouette. Mediastinal contours appear intact. Worsening of right pleural effusion and right lower thorax  airspace consolidation. Mild interstitial pulmonary edema. Osseous structures are without acute abnormality. Soft tissues are grossly normal. IMPRESSION: Worsening of right pleural effusion and right lower thorax airspace consolidation. Markedly enlarged cardiac silhouette with mild interstitial pulmonary edema. Electronically Signed   By: Fidela Salisbury M.D.   On: 11/10/2016 23:51    ____________________________________________   PROCEDURES  Procedure(s) performed: None  Procedures  Critical Care performed: Yes, see critical care note(s)   CRITICAL CARE Performed by: Charlesetta Ivory P   Total critical care time: 30 minutes  Critical care time was exclusive of separately billable procedures and treating other patients.  Critical care was necessary to treat or prevent imminent or life-threatening deterioration.  Critical care was time spent personally by me on the following activities: development of treatment plan with patient and/or surrogate as well as nursing, discussions with consultants, evaluation of patient's response to treatment, examination of patient, obtaining history from patient or surrogate, ordering and performing treatments and interventions, ordering and review of laboratory studies, ordering and review of radiographic studies, pulse oximetry and re-evaluation of patient's condition.   ____________________________________________   INITIAL IMPRESSION / ASSESSMENT AND PLAN / ED COURSE  Pertinent labs & imaging results that were available during my care of the patient were reviewed by me and considered in my medical decision making (see chart for details).  This is a 61 year old male who comes into the hospital today with a cough and shortness of breath. The patient had received multiple nebs by EMS but given his barky cough I will give him some racemic epi and place him on BiPAP. I will reassess the patient once I received the results of his blood work and  his chest x-ray.     The patient has a right lower lobe consolidation. The patient also has elevated troponin and a very high BNP. I feel that the patient may have some pneumonia. Given that he is a dialysis patient I feel that this is a healthcare associated pneumonia. I will give the patient some cefepime and vancomycin. The patient was placed on BiPAP and his cough is improved. The patient's tachycardia is also improved. I will admit the patient to the hospitalist service for further evaluation and treatment of his symptoms. ____________________________________________   FINAL CLINICAL IMPRESSION(S) / ED DIAGNOSES  Final diagnoses:  Shortness of breath  Cough  Healthcare-associated pneumonia  Pleural effusion  Elevated troponin      NEW MEDICATIONS STARTED DURING THIS VISIT:  New Prescriptions   No medications on file     Note:  This document was prepared using Dragon voice recognition software and may include unintentional dictation errors.    Loney Hering, MD 11/11/16 [phone]

## 2016-11-11 DIAGNOSIS — N186 End stage renal disease: Secondary | ICD-10-CM | POA: Diagnosis present

## 2016-11-11 DIAGNOSIS — J189 Pneumonia, unspecified organism: Secondary | ICD-10-CM | POA: Diagnosis present

## 2016-11-11 DIAGNOSIS — G629 Polyneuropathy, unspecified: Secondary | ICD-10-CM | POA: Diagnosis present

## 2016-11-11 DIAGNOSIS — J9601 Acute respiratory failure with hypoxia: Secondary | ICD-10-CM

## 2016-11-11 DIAGNOSIS — E785 Hyperlipidemia, unspecified: Secondary | ICD-10-CM | POA: Diagnosis present

## 2016-11-11 DIAGNOSIS — I739 Peripheral vascular disease, unspecified: Secondary | ICD-10-CM | POA: Diagnosis present

## 2016-11-11 DIAGNOSIS — Z7901 Long term (current) use of anticoagulants: Secondary | ICD-10-CM | POA: Diagnosis not present

## 2016-11-11 DIAGNOSIS — E213 Hyperparathyroidism, unspecified: Secondary | ICD-10-CM | POA: Diagnosis present

## 2016-11-11 DIAGNOSIS — Z86711 Personal history of pulmonary embolism: Secondary | ICD-10-CM | POA: Diagnosis not present

## 2016-11-11 DIAGNOSIS — J810 Acute pulmonary edema: Secondary | ICD-10-CM

## 2016-11-11 DIAGNOSIS — Z792 Long term (current) use of antibiotics: Secondary | ICD-10-CM | POA: Diagnosis not present

## 2016-11-11 DIAGNOSIS — Z992 Dependence on renal dialysis: Secondary | ICD-10-CM | POA: Diagnosis not present

## 2016-11-11 DIAGNOSIS — N2581 Secondary hyperparathyroidism of renal origin: Secondary | ICD-10-CM | POA: Diagnosis present

## 2016-11-11 DIAGNOSIS — R0602 Shortness of breath: Secondary | ICD-10-CM | POA: Diagnosis present

## 2016-11-11 DIAGNOSIS — I252 Old myocardial infarction: Secondary | ICD-10-CM | POA: Diagnosis not present

## 2016-11-11 DIAGNOSIS — Z89512 Acquired absence of left leg below knee: Secondary | ICD-10-CM | POA: Diagnosis not present

## 2016-11-11 DIAGNOSIS — Y95 Nosocomial condition: Secondary | ICD-10-CM | POA: Diagnosis present

## 2016-11-11 DIAGNOSIS — A419 Sepsis, unspecified organism: Secondary | ICD-10-CM | POA: Diagnosis present

## 2016-11-11 DIAGNOSIS — Z7982 Long term (current) use of aspirin: Secondary | ICD-10-CM | POA: Diagnosis not present

## 2016-11-11 DIAGNOSIS — I5043 Acute on chronic combined systolic (congestive) and diastolic (congestive) heart failure: Secondary | ICD-10-CM | POA: Diagnosis present

## 2016-11-11 DIAGNOSIS — J9691 Respiratory failure, unspecified with hypoxia: Secondary | ICD-10-CM | POA: Diagnosis present

## 2016-11-11 DIAGNOSIS — I2510 Atherosclerotic heart disease of native coronary artery without angina pectoris: Secondary | ICD-10-CM | POA: Diagnosis present

## 2016-11-11 DIAGNOSIS — I132 Hypertensive heart and chronic kidney disease with heart failure and with stage 5 chronic kidney disease, or end stage renal disease: Secondary | ICD-10-CM | POA: Diagnosis present

## 2016-11-11 DIAGNOSIS — Z79899 Other long term (current) drug therapy: Secondary | ICD-10-CM | POA: Diagnosis not present

## 2016-11-11 DIAGNOSIS — K219 Gastro-esophageal reflux disease without esophagitis: Secondary | ICD-10-CM | POA: Diagnosis present

## 2016-11-11 LAB — INFLUENZA PANEL BY PCR (TYPE A & B)
INFLAPCR: NEGATIVE
Influenza B By PCR: NEGATIVE

## 2016-11-11 LAB — TROPONIN I
Troponin I: 0.21 ng/mL (ref <0.03)
Troponin I: 0.21 ng/mL (ref <0.03)
Troponin I: 0.23 ng/mL (ref <0.03)
Troponin I: 0.24 ng/mL (ref <0.03)

## 2016-11-11 LAB — PROTIME-INR
INR: 2.31
Prothrombin Time: 25.8 seconds — ABNORMAL HIGH (ref 11.4–15.2)

## 2016-11-11 LAB — PROCALCITONIN: Procalcitonin: 0.93 ng/mL

## 2016-11-11 LAB — BRAIN NATRIURETIC PEPTIDE

## 2016-11-11 LAB — GLUCOSE, CAPILLARY
Glucose-Capillary: 158 mg/dL — ABNORMAL HIGH (ref 65–99)
Glucose-Capillary: 181 mg/dL — ABNORMAL HIGH (ref 65–99)
Glucose-Capillary: 141 mg/dL — ABNORMAL HIGH (ref 65–99)
Glucose-Capillary: 152 mg/dL — ABNORMAL HIGH (ref 65–99)

## 2016-11-11 LAB — BASIC METABOLIC PANEL
ANION GAP: 16 — AB (ref 5–15)
BUN: 94 mg/dL — ABNORMAL HIGH (ref 6–20)
CHLORIDE: 104 mmol/L (ref 101–111)
CO2: 19 mmol/L — AB (ref 22–32)
Calcium: 9.4 mg/dL (ref 8.9–10.3)
Creatinine, Ser: 12.83 mg/dL — ABNORMAL HIGH (ref 0.61–1.24)
GFR calc non Af Amer: 4 mL/min — ABNORMAL LOW (ref >60)
GFR, EST AFRICAN AMERICAN: 4 mL/min — AB (ref >60)
GLUCOSE: 98 mg/dL (ref 65–99)
POTASSIUM: 5.9 mmol/L — AB (ref 3.5–5.1)
Sodium: 139 mmol/L (ref 135–145)

## 2016-11-11 LAB — MRSA PCR SCREENING: MRSA by PCR: NEGATIVE

## 2016-11-11 LAB — TSH: TSH: 0.286 u[IU]/mL — ABNORMAL LOW (ref 0.350–4.500)

## 2016-11-11 MED ORDER — SEVELAMER CARBONATE 800 MG PO TABS
800.0000 mg | ORAL_TABLET | Freq: Every day | ORAL | Status: DC
  Filled 2016-11-11: qty 1

## 2016-11-11 MED ORDER — RENA-VITE PO TABS
1.0000 | ORAL_TABLET | Freq: Every day | ORAL | Status: DC
  Administered 2016-11-11 – 2016-11-13 (×3): 1 via ORAL
  Filled 2016-11-11 (×3): qty 1

## 2016-11-11 MED ORDER — CHLORHEXIDINE GLUCONATE 0.12 % MT SOLN
15.0000 mL | Freq: Two times a day (BID) | OROMUCOSAL | Status: DC
  Administered 2016-11-11 – 2016-11-14 (×5): 15 mL via OROMUCOSAL
  Filled 2016-11-11 (×6): qty 15

## 2016-11-11 MED ORDER — CEFEPIME HCL 2 G IJ SOLR
2.0000 g | Freq: Once | INTRAMUSCULAR | Status: AC
  Administered 2016-11-11: 2 g via INTRAVENOUS
  Filled 2016-11-11 (×2): qty 2

## 2016-11-11 MED ORDER — ASPIRIN 325 MG PO TABS
325.0000 mg | ORAL_TABLET | Freq: Every day | ORAL | Status: DC
  Administered 2016-11-14: 325 mg via ORAL
  Filled 2016-11-11: qty 1

## 2016-11-11 MED ORDER — ONDANSETRON HCL 4 MG/2ML IJ SOLN: 4.0000 mg | Freq: Four times a day (QID) | INTRAMUSCULAR | Status: DC | PRN

## 2016-11-11 MED ORDER — POLYETHYLENE GLYCOL 3350 17 G PO PACK
17.0000 g | PACK | ORAL | Status: DC
  Administered 2016-11-14: 17 g via ORAL
  Filled 2016-11-11 (×3): qty 1

## 2016-11-11 MED ORDER — PANTOPRAZOLE SODIUM 40 MG PO TBEC
40.0000 mg | DELAYED_RELEASE_TABLET | Freq: Every day | ORAL | Status: DC
  Administered 2016-11-11 – 2016-11-14 (×3): 40 mg via ORAL
  Filled 2016-11-11 (×3): qty 1

## 2016-11-11 MED ORDER — NEPRO/CARBSTEADY PO LIQD
237.0000 mL | Freq: Every day | ORAL | Status: DC
  Administered 2016-11-11 – 2016-11-14 (×4): 237 mL via ORAL

## 2016-11-11 MED ORDER — VANCOMYCIN HCL IN DEXTROSE 1-5 GM/200ML-% IV SOLN
1000.0000 mg | Freq: Once | INTRAVENOUS | Status: AC
  Administered 2016-11-11: 1000 mg via INTRAVENOUS
  Filled 2016-11-11: qty 200

## 2016-11-11 MED ORDER — ONDANSETRON HCL 4 MG PO TABS: 4.0000 mg | ORAL_TABLET | Freq: Four times a day (QID) | ORAL | Status: DC | PRN

## 2016-11-11 MED ORDER — DARBEPOETIN ALFA 40 MCG/0.4ML IJ SOSY
40.0000 ug | PREFILLED_SYRINGE | INTRAMUSCULAR | Status: DC
  Administered 2016-11-11: 40 ug via INTRAVENOUS
  Filled 2016-11-11: qty 0.4

## 2016-11-11 MED ORDER — ORAL CARE MOUTH RINSE: 15.0000 mL | Freq: Two times a day (BID) | OROMUCOSAL | Status: DC

## 2016-11-11 MED ORDER — TIOTROPIUM BROMIDE MONOHYDRATE 18 MCG IN CAPS
18.0000 ug | ORAL_CAPSULE | Freq: Every day | RESPIRATORY_TRACT | Status: DC
  Administered 2016-11-11 – 2016-11-14 (×4): 18 ug via RESPIRATORY_TRACT
  Filled 2016-11-11: qty 5

## 2016-11-11 MED ORDER — ACETAMINOPHEN 325 MG PO TABS: 650.0000 mg | ORAL_TABLET | Freq: Four times a day (QID) | ORAL | Status: DC | PRN

## 2016-11-11 MED ORDER — ACETAMINOPHEN 650 MG RE SUPP: 650.0000 mg | Freq: Four times a day (QID) | RECTAL | Status: DC | PRN

## 2016-11-11 MED ORDER — BENZONATATE 100 MG PO CAPS
100.0000 mg | ORAL_CAPSULE | Freq: Three times a day (TID) | ORAL | Status: DC | PRN
  Administered 2016-11-11 – 2016-11-13 (×3): 100 mg via ORAL
  Filled 2016-11-11 (×3): qty 1

## 2016-11-11 MED ORDER — WARFARIN SODIUM 2 MG PO TABS: 3.0000 mg | ORAL_TABLET | Freq: Every day | ORAL | Status: DC

## 2016-11-11 MED ORDER — FLUTICASONE PROPIONATE 50 MCG/ACT NA SUSP
1.0000 | Freq: Every day | NASAL | Status: DC
  Administered 2016-11-11 – 2016-11-14 (×3): 1 via NASAL
  Filled 2016-11-11: qty 16

## 2016-11-11 MED ORDER — DOXERCALCIFEROL 4 MCG/2ML IV SOLN
2.5000 ug | INTRAVENOUS | Status: DC
  Administered 2016-11-11 – 2016-11-13 (×2): 2.5 ug via INTRAVENOUS
  Filled 2016-11-11 (×3): qty 2

## 2016-11-11 MED ORDER — ATORVASTATIN CALCIUM 10 MG PO TABS
10.0000 mg | ORAL_TABLET | Freq: Every day | ORAL | Status: DC
  Administered 2016-11-11 – 2016-11-13 (×3): 10 mg via ORAL
  Filled 2016-11-11 (×3): qty 1

## 2016-11-11 MED ORDER — IPRATROPIUM-ALBUTEROL 0.5-2.5 (3) MG/3ML IN SOLN
3.0000 mL | Freq: Four times a day (QID) | RESPIRATORY_TRACT | Status: DC
  Administered 2016-11-11 – 2016-11-14 (×11): 3 mL via RESPIRATORY_TRACT
  Filled 2016-11-11 (×12): qty 3

## 2016-11-11 MED ORDER — VANCOMYCIN HCL IN DEXTROSE 750-5 MG/150ML-% IV SOLN
750.0000 mg | Freq: Once | INTRAVENOUS | Status: AC
  Administered 2016-11-11: 750 mg via INTRAVENOUS
  Filled 2016-11-11: qty 150

## 2016-11-11 MED ORDER — SODIUM CHLORIDE 0.9 % IV SOLN
125.0000 mg | INTRAVENOUS | Status: DC
  Administered 2016-11-11: 125 mg via INTRAVENOUS
  Filled 2016-11-11 (×2): qty 10

## 2016-11-11 MED ORDER — VANCOMYCIN HCL IN DEXTROSE 750-5 MG/150ML-% IV SOLN: 750.0000 mg | INTRAVENOUS | Status: DC | PRN

## 2016-11-11 MED ORDER — ALBUTEROL SULFATE (2.5 MG/3ML) 0.083% IN NEBU
2.5000 mg | INHALATION_SOLUTION | RESPIRATORY_TRACT | Status: DC | PRN
  Administered 2016-11-11 – 2016-11-13 (×4): 2.5 mg via RESPIRATORY_TRACT
  Filled 2016-11-11 (×4): qty 3

## 2016-11-11 MED ORDER — ALBUTEROL SULFATE (2.5 MG/3ML) 0.083% IN NEBU
2.5000 mg | INHALATION_SOLUTION | RESPIRATORY_TRACT | Status: DC
  Administered 2016-11-11: 2.5 mg via RESPIRATORY_TRACT
  Filled 2016-11-11 (×2): qty 3

## 2016-11-11 MED ORDER — OXYCODONE HCL 5 MG PO TABS: 5.0000 mg | ORAL_TABLET | Freq: Three times a day (TID) | ORAL | Status: DC | PRN

## 2016-11-11 MED ORDER — NITROGLYCERIN 0.4 MG SL SUBL: 0.4000 mg | SUBLINGUAL_TABLET | SUBLINGUAL | Status: DC | PRN

## 2016-11-11 MED ORDER — DOCUSATE SODIUM 100 MG PO CAPS
100.0000 mg | ORAL_CAPSULE | Freq: Two times a day (BID) | ORAL | Status: DC
  Administered 2016-11-11 – 2016-11-14 (×5): 100 mg via ORAL
  Filled 2016-11-11 (×6): qty 1

## 2016-11-11 MED ORDER — GUAIFENESIN-CODEINE 100-10 MG/5ML PO SOLN
10.0000 mL | ORAL | Status: DC | PRN
  Administered 2016-11-11 – 2016-11-14 (×4): 10 mL via ORAL
  Filled 2016-11-11 (×4): qty 10

## 2016-11-11 MED ORDER — ASPIRIN 325 MG PO TABS
325.0000 mg | ORAL_TABLET | ORAL | Status: DC
  Administered 2016-11-11: 325 mg via ORAL
  Filled 2016-11-11: qty 1

## 2016-11-11 MED ORDER — PREGABALIN 75 MG PO CAPS
75.0000 mg | ORAL_CAPSULE | Freq: Two times a day (BID) | ORAL | Status: DC
  Administered 2016-11-11 – 2016-11-14 (×6): 75 mg via ORAL
  Filled 2016-11-11 (×6): qty 1

## 2016-11-11 MED ORDER — CALCIUM ACETATE (PHOS BINDER) 667 MG PO CAPS
667.0000 mg | ORAL_CAPSULE | Freq: Three times a day (TID) | ORAL | Status: DC
  Administered 2016-11-11 – 2016-11-14 (×8): 667 mg via ORAL
  Filled 2016-11-11 (×8): qty 1

## 2016-11-11 MED ORDER — MIDODRINE HCL 5 MG PO TABS
5.0000 mg | ORAL_TABLET | Freq: Two times a day (BID) | ORAL | Status: DC
  Administered 2016-11-11 – 2016-11-14 (×6): 5 mg via ORAL
  Filled 2016-11-11 (×6): qty 1

## 2016-11-11 MED ORDER — MOMETASONE FURO-FORMOTEROL FUM 200-5 MCG/ACT IN AERO
2.0000 | INHALATION_SPRAY | Freq: Two times a day (BID) | RESPIRATORY_TRACT | Status: DC
  Administered 2016-11-11 – 2016-11-14 (×7): 2 via RESPIRATORY_TRACT
  Filled 2016-11-11: qty 8.8

## 2016-11-11 MED ORDER — PANTOPRAZOLE SODIUM 20 MG PO TBEC: 20.0000 mg | DELAYED_RELEASE_TABLET | Freq: Every day | ORAL | Status: DC

## 2016-11-11 MED ORDER — SEVELAMER CARBONATE 800 MG PO TABS
800.0000 mg | ORAL_TABLET | Freq: Every day | ORAL | Status: DC
  Administered 2016-11-11 – 2016-11-14 (×4): 800 mg via ORAL
  Filled 2016-11-11 (×4): qty 1

## 2016-11-11 MED ORDER — CEFEPIME-DEXTROSE 1 GM/50ML IV SOLR
1.0000 g | INTRAVENOUS | Status: DC
  Filled 2016-11-11: qty 50

## 2016-11-11 MED ORDER — ISOSORBIDE MONONITRATE ER 30 MG PO TB24
30.0000 mg | ORAL_TABLET | Freq: Every day | ORAL | Status: DC
  Administered 2016-11-11 – 2016-11-14 (×3): 30 mg via ORAL
  Filled 2016-11-11 (×3): qty 1

## 2016-11-11 MED ORDER — WARFARIN SODIUM 5 MG PO TABS
2.5000 mg | ORAL_TABLET | Freq: Every day | ORAL | Status: DC
  Administered 2016-11-12 (×2): 2.5 mg via ORAL
  Filled 2016-11-11 (×2): qty 0.5

## 2016-11-11 NOTE — Progress Notes (Signed)
Pre hd assessment  

## 2016-11-11 NOTE — Progress Notes (Signed)
[facility] NAME: [name]    MR#:  [id number]  DATE OF BIRTH:  1955-11-21  SUBJECTIVE:   Patient here due to shortness of breath and noted to be hypoxic secondary to pulmonary edema and also underlying pneumonia. Plan for hemodialysis today for fluid removal. Patient has a cough which is nonproductive.  REVIEW OF SYSTEMS:    Review of Systems  Constitutional: Negative for chills and fever.  HENT: Negative for congestion and tinnitus.   Eyes: Negative for blurred vision and double vision.  Respiratory: Positive for cough and shortness of breath. Negative for wheezing.   Cardiovascular: Negative for chest pain, orthopnea and PND.  Gastrointestinal: Negative for abdominal pain, diarrhea, nausea and vomiting.  Genitourinary: Negative for dysuria and hematuria.  Neurological: Negative for dizziness, sensory change and focal weakness.  All other systems reviewed and are negative.   Nutrition: Renal diet Tolerating Diet: yes Tolerating PT: Await Eval.   DRUG ALLERGIES:   Allergies  Allergen Reactions  . Dust Mite Extract Other (See Comments)    Reaction: unknown    VITALS:  Blood pressure 117/81, pulse 83, temperature 97.8 F (36.6 C), temperature source Oral, resp. rate 16, height 6\' 2"  (1.88 m), weight 86.6 kg (190 lb 14.7 oz), SpO2 98 %.  PHYSICAL EXAMINATION:   Physical Exam  GENERAL:  61 y.o.-year-old patient lying in bed in mild Resp. Distress.  EYES: Pupils equal, round, reactive to light and accommodation. No scleral icterus. Extraocular muscles intact.  HEENT: Head atraumatic, normocephalic. Oropharynx and nasopharynx clear.  NECK:  Supple, no jugular venous distention. No thyroid enlargement, no tenderness.  LUNGS: Good A/e b/l, no wheezing, bibasilar rales, + rhonchi diffuely. No use of accessory muscles of respiration.  CARDIOVASCULAR: S1, S2 normal. No murmurs, rubs, or gallops.  ABDOMEN: Soft, nontender,  nondistended. Bowel sounds present. No organomegaly or mass.  EXTREMITIES: No cyanosis, clubbing or edema b/l.   Left BKA, multiple Right toe Amputations. NEUROLOGIC: Cranial nerves II through XII are intact. No focal Motor or sensory deficits b/l.   PSYCHIATRIC: The patient is alert and oriented x 3.  SKIN: No obvious rash, lesion, or ulcer.    LABORATORY PANEL:   CBC  Recent Labs Lab 11/10/16 2335  WBC 10.4  HGB 13.6  HCT 41.0  PLT 186   ------------------------------------------------------------------------------------------------------------------  Chemistries   Recent Labs Lab 11/10/16 2335  NA 139  K 5.9*  CL 104  CO2 19*  GLUCOSE 98  BUN 94*  CREATININE 12.83*  CALCIUM 9.4   ------------------------------------------------------------------------------------------------------------------  Cardiac Enzymes  Recent Labs Lab 11/11/16 1029  TROPONINI 0.21*   ------------------------------------------------------------------------------------------------------------------  RADIOLOGY:  Dg Chest Portable 1 View  Result Date: 11/10/2016 CLINICAL DATA:  Acute onset of shortness of breath.  Cough. EXAM: PORTABLE CHEST 1 VIEW COMPARISON:  11/02/2016 FINDINGS: Large bore central venous catheter in stable position. Vascular stent noted. Markedly enlarged cardiac silhouette. Mediastinal contours appear intact. Worsening of right pleural effusion and right lower thorax airspace consolidation. Mild interstitial pulmonary edema. Osseous structures are without acute abnormality. Soft tissues are grossly normal. IMPRESSION: Worsening of right pleural effusion and right lower thorax airspace consolidation. Markedly enlarged cardiac silhouette with mild interstitial pulmonary edema. Electronically Signed   By: Fidela Salisbury M.D.   On: 11/10/2016 23:51     ASSESSMENT AND PLAN:   61 year old male with past medical history of end-stage renal disease on hemodialysis, COPD,  hypertension, hyperlipidemia, history of pulmonary embolism, previous history of  MI, peripheral vascular disease, GERD, CHF who presented to the hospital due to shortness of breath.   1. acute respiratory failure with hypoxia-secondary to pulmonary edema, pneumonia. -The patient was on BiPAP has been weaned off of it. Continue O2 supplementation -Plan for hemodialysis today for fluid removal, continue IV antibiotics with IV cefepime.  2. Pneumonia-suspected to be HCAP.  - MRSA PCR (-). Cont. IV Cefepime. Follow cultures.   3. CHF-acute on chronic diastolic dysfunction -Patient does not make any urine. Plan for hemodialysis today. Follow clinically.  4. End-stage renal disease on hemodialysis-nephrology has been consult it. Patient to get hemodialysis today. Continue further care as per nephrology.  5. Secondary hyperparathyroidism-continue PhosLo, Renvela  6. COPD-continue Dulera, Spiriva.  7. History of pulmonary embolism-continue warfarin.  8. Neuropathy-continue Lyrica  9. GERD-continue Protonix   All the records are reviewed and case discussed with Care Management/Social Worker. Management plans discussed with the patient, family and they are in agreement.  CODE STATUS: Full  DVT Prophylaxis: Ted's & SCD's  TOTAL TIME TAKING CARE OF THIS PATIENT: 63 minutes.   POSSIBLE D/C IN 1-2 DAYS, DEPENDING ON CLINICAL CONDITION.   Henreitta Leber M.D on 11/11/2016 at 3:18 PM  Between 7am to 6pm - Pager - [phone]  After 6pm go to [email or web address] - [facility]  Office  [phone]  CC: Primary care physician; [facility], [place]

## 2016-11-11 NOTE — ED Notes (Signed)
Cardiopulmonary called to transport pt on bipap.

## 2016-11-11 NOTE — Progress Notes (Addendum)
ANTICOAGULATION CONSULT NOTE - Initial Consult  Pharmacy Consult for warfarin dosing Indication: h/o recurrent PE  Allergies  Allergen Reactions  . Dust Mite Extract Other (See Comments)    Reaction: unknown    Patient Measurements: Height: 6\' 2"  (188 cm) Weight: 168 lb (76.2 kg) IBW/kg (Calculated) : 82.2 Heparin Dosing Weight: n/a  Vital Signs: Temp: 98.2 F (36.8 C) (06/18 0422) Temp Source: Axillary (06/18 0422) BP: 116/89 (06/18 0422) Pulse Rate: 98 (06/18 0324)  Labs:  Recent Labs  11/10/16 2335  HGB 13.6  HCT 41.0  PLT 186  LABPROT 25.8*  INR 2.31  CREATININE 12.83*  TROPONINI 0.24*    Estimated Creatinine Clearance: 6.4 mL/min (A) (by C-G formula based on SCr of 12.83 mg/dL (H)).   Medical History: Past Medical History:  Diagnosis Date  . 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 Amputee, below knee, left ([place])   . Anemia   . Asthma   . Cardiomyopathy ([place])   . CHF (congestive heart failure) ([place])   . Chronic systolic heart failure ([place])   . Complication of anesthesia    hypotension  . COPD (chronic obstructive pulmonary disease) ([place])   . Coronary artery disease   . Dialysis patient ([place])    Mon, Wed, Fri  . End stage renal disease ([place])   . GERD (gastroesophageal reflux disease)   . Headache   . History of kidney stones   . History of pulmonary embolism   . HLD (hyperlipidemia)   . HTN (hypertension)   . Hyperparathyroidism   . Myocardial infarction ([place])   . Peripheral vascular disease ([place])   . Shortness of breath dyspnea   . Sleep apnea    NO C-PAP, Patient stated in process of  "getting one"   . Tobacco dependence     Medications:  Home dose 2.5 mg daily per med rec and review of old notes.  Assessment: INR therapeutic on admission.  Goal of Therapy:  INR 2-3    Plan:  Restart home dose of 2.5 mg daily. INR daily while on antibiotic therapy.  Briannie Gutierrez S 11/11/2016,5:18 AM

## 2016-11-11 NOTE — Progress Notes (Signed)
Post hd vitals 

## 2016-11-11 NOTE — Progress Notes (Signed)
  End of hd 

## 2016-11-11 NOTE — Progress Notes (Signed)
Post hd assessment 

## 2016-11-11 NOTE — H&P (Signed)
[name] is an 61 y.o. male.   Chief Complaint: Shortness of breath HPI: The patient with past medical history of ESRD on dialysis, CHF, hypertension and coronary artery disease presents to emergency department via EMS complaining of shortness of breath. The patient states that he has been having difficulty breathing for at least one week. The patient complains of a cough productive of thick green sputum. He admits to having subjective fever 2 or 3 days ago. Upon arrival he was found to have oxygen saturations in the mid to low 80s. Chest x-ray showed pulmonary edema as well as possible right lower lobe infiltrate. The patient was started on IV antibiotics and placed on BiPAP prior to the emergency department staff called the hospitalist service for admission.  Past Medical History:  Diagnosis Date  . 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 Amputee, below knee, left ([place])   . Anemia   . Asthma   . Cardiomyopathy ([place])   . CHF (congestive heart failure) ([place])   . Chronic systolic heart failure ([place])   . Complication of anesthesia    hypotension  . COPD (chronic obstructive pulmonary disease) ([place])   . Coronary artery disease   . Dialysis patient ([place])    Mon, Wed, Fri  . End stage renal disease ([place])   . GERD (gastroesophageal reflux disease)   . Headache   . History of kidney stones   . History of pulmonary embolism   . HLD (hyperlipidemia)   . HTN (hypertension)   . Hyperparathyroidism   . Myocardial infarction ([place])   . Peripheral vascular disease ([place])   . Shortness of breath dyspnea   . Sleep apnea    NO C-PAP, Patient stated in process of  "getting one"   . Tobacco dependence     Past Surgical History:  Procedure Laterality Date  . AMPUTATION Left 05/06/2014   Procedure: AMPUTATION BELOW KNEE;  Surgeon: Elam Dutch, MD;  Location: [facility];  Service: Vascular;  Laterality: Left;  . AMPUTATION Right 01/12/2015   Procedure: Foot transmetatarsal  amputation;  Surgeon: Algernon Huxley, MD;  Location: [facility];  Service: Vascular;  Laterality: Right;  . APPLICATION OF WOUND VAC Right 03/01/2015   Procedure: Application of Bio-connekt graft and wound vac application to right foot ;  Surgeon: Algernon Huxley, MD;  Location: [facility];  Service: Vascular;  Laterality: Right;  . AV FISTULA PLACEMENT Left   . CARDIAC CATHETERIZATION     stent placement   . CORONARY ANGIOPLASTY    . DIALYSIS/PERMA CATHETER INSERTION N/A 07/31/2016   Procedure: Dialysis/Perma Catheter Insertion;  Surgeon: Algernon Huxley, MD;  Location: [facility];  Service: Cardiovascular;  Laterality: N/A;  . LIGATION OF ARTERIOVENOUS  FISTULA Right 01/31/2016   Procedure: LIGATION OF ARTERIOVENOUS  FISTULA;  Surgeon: Algernon Huxley, MD;  Location: [facility];  Service: Vascular;  Laterality: Right;  . PERIPHERAL VASCULAR CATHETERIZATION Right 12/15/2014   Procedure: Lower Extremity Angiography;  Surgeon: Algernon Huxley, MD;  Location: [facility];  Service: Cardiovascular;  Laterality: Right;  . PERIPHERAL VASCULAR CATHETERIZATION  12/15/2014   Procedure: Lower Extremity Intervention;  Surgeon: Algernon Huxley, MD;  Location: [facility];  Service: Cardiovascular;;  . PERIPHERAL VASCULAR CATHETERIZATION Right 08/14/2015   Procedure: A/V Shuntogram/Fistulagram;  Surgeon: Algernon Huxley, MD;  Location: [facility];  Service: Cardiovascular;  Laterality: Right;  . PERIPHERAL VASCULAR CATHETERIZATION N/A 08/14/2015  Procedure: A/V Shunt Intervention;  Surgeon: Algernon Huxley, MD;  Location: [facility];  Service: Cardiovascular;  Laterality: N/A;  . PERIPHERAL VASCULAR CATHETERIZATION N/A 01/11/2016   Procedure: Dialysis/Perma Catheter Insertion;  Surgeon: Algernon Huxley, MD;  Location: [facility];  Service: Cardiovascular;  Laterality: N/A;  . REVISON OF ARTERIOVENOUS FISTULA Right 02/17/2016   Procedure: removal of AV fistula;  Surgeon: Serafina Mitchell, MD;   Location: [facility];  Service: Vascular;  Laterality: Right;  . REVISON OF ARTERIOVENOUS FISTULA Right 01/31/2016   Procedure: REVISON OF ARTERIOVENOUS FISTULA ( BRACHIOCEPHALIC ) W/ ARTEGRAFT;  Surgeon: Algernon Huxley, MD;  Location: [facility];  Service: Vascular;  Laterality: Right;  . TRANSMETATARSAL AMPUTATION Right 05/04/2015   Procedure: TRANSMETATARSAL AMPUTATION REVISION, great toe amputation;  Surgeon: Algernon Huxley, MD;  Location: [facility];  Service: Vascular;  Laterality: Right;    Family History  Problem Relation Age of Onset  . Heart failure Other   . Hypertension Other   . Leukemia Other   . Diabetes Other    Social History:  reports that he has been smoking Cigarettes.  He has been smoking about 0.25 packs per day. He has never used smokeless tobacco. He reports that he does not drink alcohol or use drugs.  Allergies:  Allergies  Allergen Reactions  . Dust Mite Extract Other (See Comments)    Reaction: unknown    Facility-Administered Medications Prior to Admission  Medication Dose Route Frequency Provider Last Rate Last Dose  . vancomycin (VANCOCIN) 1,000 mg in sodium chloride 0.9 % 500 mL IVPB  1,000 mg Intravenous Once Stegmayer, Kimberly A, PA-C       Medications Prior to Admission  Medication Sig Dispense Refill  . acetaminophen (TYLENOL) 500 MG tablet Take 1,000 mg by mouth every 6 (six) hours as needed.    [name] aspirin 325 MG tablet Take 325 mg by mouth every morning.     [name] atorvastatin (LIPITOR) 10 MG tablet Take 10 mg by mouth at bedtime.     . budesonide-formoterol (SYMBICORT) 160-4.5 MCG/ACT inhaler Inhale 2 puffs into the lungs 2 (two) times daily. Reported on 08/14/2015    . calcium acetate (PHOSLO) 667 MG capsule Take 667 mg by mouth 3 (three) times daily with meals.    . docusate sodium (COLACE) 100 MG capsule Take 100 mg by mouth 2 (two) times daily as needed for mild constipation. Reported on 08/14/2015    . fluticasone (FLONASE) 50 MCG/ACT nasal spray Place 1  spray into both nostrils daily.    . Ipratropium-Albuterol (COMBIVENT IN) Inhale 1 puff into the lungs every 6 (six) hours as needed (for wheezing/shortness of breath). COMBIVENT AEROSOL 18-103 MCG/ACT     . ipratropium-albuterol (DUONEB) 0.5-2.5 (3) MG/3ML SOLN Take 3 mLs by nebulization every 4 (four) hours as needed (SOB).    . isosorbide mononitrate (IMDUR) 30 MG 24 hr tablet Take 1 tablet (30 mg total) by mouth daily. 30 tablet 2  . midodrine (PROAMATINE) 5 MG tablet 5 mg 2 (two) times daily with a meal.     . multivitamin (RENA-VIT) TABS tablet Take 1 tablet by mouth at bedtime.  0  . nitroGLYCERIN (NITROSTAT) 0.4 MG SL tablet Place 0.4 mg under the tongue every 5 (five) minutes as needed for chest pain.    [name] oxyCODONE (OXY IR/ROXICODONE) 5 MG immediate release tablet Take 5 mg by mouth every 8 (eight) hours as needed for severe pain.    . pantoprazole (  PROTONIX) 20 MG tablet Take 20 mg by mouth daily.    . polyethylene glycol (MIRALAX / GLYCOLAX) packet Take 17 g by mouth every morning. Hold for loose stools.    . pregabalin (LYRICA) 75 MG capsule Take 75 mg by mouth 2 (two) times daily.    . sevelamer carbonate (RENVELA) 800 MG tablet Take 800 mg by mouth daily.     [name] tiotropium (SPIRIVA) 18 MCG inhalation capsule Place 18 mcg into inhaler and inhale daily.    [name] warfarin (COUMADIN) 2.5 MG tablet Take 2.5 mg by mouth daily.     [name] warfarin (COUMADIN) 3 MG tablet Take 3 mg by mouth daily.    . Darbepoetin Alfa (ARANESP) 40 MCG/0.4ML SOSY injection Inject 0.4 mLs (40 mcg total) into the vein every Monday with hemodialysis. 8.4 mL   . doxercalciferol (HECTOROL) 4 MCG/2ML injection Inject 1.25 mLs (2.5 mcg total) into the vein every Monday, Wednesday, and Friday with hemodialysis. 2 mL   . ferric gluconate 125 mg in sodium chloride 0.9 % 100 mL Inject 125 mg into the vein every Monday, Wednesday, and Friday with hemodialysis.    . Nutritional Supplements (FEEDING SUPPLEMENT, NEPRO CARB STEADY,)  LIQD Take 237 mLs by mouth daily.    . Vancomycin (VANCOCIN) 750-5 MG/150ML-% SOLN With each dialysis, Tuesday, Thursday and Saturday. Last dose April 3rd (Patient not taking: Reported on 11/11/2016) 4000 mL     Results for orders placed or performed during the hospital encounter of 11/10/16 (from the past 48 hour(s))  CBC     Status: Abnormal   Collection Time: 11/10/16 11:35 PM  Result Value Ref Range   WBC 10.4 3.8 - 10.6 K/uL   RBC 4.67 4.40 - 5.90 MIL/uL   Hemoglobin 13.6 13.0 - 18.0 g/dL   HCT 41.0 40.0 - 52.0 %   MCV 87.7 80.0 - 100.0 fL   MCH 29.1 26.0 - 34.0 pg   MCHC 33.2 32.0 - 36.0 g/dL   RDW 14.8 (H) 11.5 - 14.5 %   Platelets 186 150 - 440 K/uL  Basic metabolic panel     Status: Abnormal   Collection Time: 11/10/16 11:35 PM  Result Value Ref Range   Sodium 139 135 - 145 mmol/L   Potassium 5.9 (H) 3.5 - 5.1 mmol/L   Chloride 104 101 - 111 mmol/L   CO2 19 (L) 22 - 32 mmol/L   Glucose, Bld 98 65 - 99 mg/dL   BUN 94 (H) 6 - 20 mg/dL    Comment: RESULT CONFIRMED BY MANUAL DILUTION.PMH   Creatinine, Ser 12.83 (H) 0.61 - 1.24 mg/dL   Calcium 9.4 8.9 - 10.3 mg/dL   GFR calc non Af Amer 4 (L) >60 mL/min   GFR calc Af Amer 4 (L) >60 mL/min    Comment: (NOTE) The eGFR has been calculated using the CKD EPI equation. This calculation has not been validated in all clinical situations. eGFR's persistently <60 mL/min signify possible Chronic Kidney Disease.    Anion gap 16 (H) 5 - 15  Troponin I     Status: Abnormal   Collection Time: 11/10/16 11:35 PM  Result Value Ref Range   Troponin I 0.24 (HH) <0.03 ng/mL    Comment: CRITICAL RESULT CALLED TO, READ BACK BY AND VERIFIED WITH MICHELE MORTON AT 0030 11/11/16.PMH  Blood gas, venous     Status: Abnormal   Collection Time: 11/10/16 11:35 PM  Result Value Ref Range   FIO2 0.21    pH, Ven  7.31 7.250 - 7.430   pCO2, Ven 37 (L) 44.0 - 60.0 mmHg   pO2, Ven 48.0 (H) 32.0 - 45.0 mmHg   Bicarbonate 18.6 (L) 20.0 - 28.0 mmol/L    Acid-base deficit 7.0 (H) 0.0 - 2.0 mmol/L   O2 Saturation 79.1 %   Patient temperature 37.0    Collection site LINE    Sample type VENOUS   Brain natriuretic peptide     Status: Abnormal   Collection Time: 11/10/16 11:35 PM  Result Value Ref Range   B Natriuretic Peptide >4,500.0 (H) 0.0 - 100.0 pg/mL    Comment: RESULT CONFIRMED BY MANUAL DILUTION.PMH  Procalcitonin - Baseline     Status: None   Collection Time: 11/10/16 11:35 PM  Result Value Ref Range   Procalcitonin 0.93 ng/mL    Comment:        Interpretation: PCT > 0.5 ng/mL and <= 2 ng/mL: Systemic infection (sepsis) is possible, but other conditions are known to elevate PCT as well. (NOTE)         ICU PCT Algorithm               Non ICU PCT Algorithm    ----------------------------     ------------------------------         PCT < 0.25 ng/mL                 PCT < 0.1 ng/mL     Stopping of antibiotics            Stopping of antibiotics       strongly encouraged.               strongly encouraged.    ----------------------------     ------------------------------       PCT level decrease by               PCT < 0.25 ng/mL       >= 80% from peak PCT       OR PCT 0.25 - 0.5 ng/mL          Stopping of antibiotics                                             encouraged.     Stopping of antibiotics           encouraged.    ----------------------------     ------------------------------       PCT level decrease by              PCT >= 0.25 ng/mL       < 80% from peak PCT        AND PCT >= 0.5 ng/mL             Continuing antibiotics                                              encouraged.       Continuing antibiotics            encouraged.    ----------------------------     ------------------------------     PCT level increase compared          PCT > 0.5 ng/mL  with peak PCT AND          PCT >= 0.5 ng/mL             Escalation of antibiotics                                          strongly encouraged.       Escalation of antibiotics        strongly encouraged.    Dg Chest Portable 1 View  Result Date: 11/10/2016 CLINICAL DATA:  Acute onset of shortness of breath.  Cough. EXAM: PORTABLE CHEST 1 VIEW COMPARISON:  11/02/2016 FINDINGS: Large bore central venous catheter in stable position. Vascular stent noted. Markedly enlarged cardiac silhouette. Mediastinal contours appear intact. Worsening of right pleural effusion and right lower thorax airspace consolidation. Mild interstitial pulmonary edema. Osseous structures are without acute abnormality. Soft tissues are grossly normal. IMPRESSION: Worsening of right pleural effusion and right lower thorax airspace consolidation. Markedly enlarged cardiac silhouette with mild interstitial pulmonary edema. Electronically Signed   By: Fidela Salisbury M.D.   On: 11/10/2016 23:51    Review of Systems  Constitutional: Positive for fever (Subjective). Negative for chills.  HENT: Negative for sore throat and tinnitus.   Eyes: Negative for blurred vision and redness.  Respiratory: Positive for cough, sputum production and shortness of breath.   Cardiovascular: Positive for chest pain (Only with cough). Negative for palpitations, orthopnea and PND.  Gastrointestinal: Negative for abdominal pain, diarrhea, nausea and vomiting.  Genitourinary: Negative for dysuria, frequency and urgency.  Musculoskeletal: Negative for joint pain and myalgias.  Skin: Negative for rash.       No lesions  Neurological: Negative for speech change, focal weakness and weakness.  Endo/Heme/Allergies: Does not bruise/bleed easily.       No temperature intolerance  Psychiatric/Behavioral: Negative for depression and suicidal ideas.    Blood pressure (!) 119/94, pulse 98, temperature 99.1 F (37.3 C), temperature source Oral, resp. rate 18, height 6' 2"  (1.88 m), weight 76.2 kg (168 lb), SpO2 100 %. Physical Exam  Constitutional: He is oriented to person, place, and time. He appears  well-developed and well-nourished. He appears distressed.  HENT:  Head: Normocephalic and atraumatic.  Mouth/Throat: Oropharynx is clear and moist.  Eyes: Conjunctivae and EOM are normal. Pupils are equal, round, and reactive to light. No scleral icterus.  Neck: Normal range of motion. Neck supple. No JVD present. No tracheal deviation present. No thyromegaly present.  Cardiovascular: Regular rhythm and normal heart sounds.  Tachycardia present.  Exam reveals no gallop and no friction rub.   No murmur heard. Respiratory: Tachypnea noted. He has decreased breath sounds in the right lower field. He has rales (Throughout).  PermCath in place with left chest  GI: Soft. Bowel sounds are normal. He exhibits no distension. There is no tenderness.  Genitourinary:  Genitourinary Comments: Deferred  Musculoskeletal: Normal range of motion. He exhibits deformity (Left BKA). He exhibits no edema.  Lymphadenopathy:    He has no cervical adenopathy.  Neurological: He is alert and oriented to person, place, and time. No cranial nerve deficit.  Skin: Skin is warm and dry. No rash noted. No erythema.  Psychiatric: He has a normal mood and affect. His behavior is normal. Judgment and thought content normal.     Assessment/Plan This is a 61 year old and rubella admitted for acute respiratory failure. 1. Respiratory failure:  With hypoxia; secondary to pulmonary edema. The patient may also have consolidation in the right lower lobe. Due to his immunocompromised status we will treat him for healthcare associated pneumonia. Continue BiPAP for work of breathing. FiO2 40%. The patient produces no urine. He will need dialysis to improve pulmonary edema. 2. CHF: Acute on chronic; systolic. Elevated troponin present likely secondary to demand ischemia and poor renal clearance. Continue aspirin and Imdur. 3. Sepsis: The patient meets criteria via tachycardia and intermittent tachypnea. Pro-calcitonin is elevated. Source  is likely pneumonia although chest x-ray is not demonstrably consolidated (my read). He is hemodynamically stable. Continue broad-spectrum antibiotics. Follow blood cultures were growth and sensitivities. Continue Midrin per home regimen 4. ESRD: On hemodialysis Monday Wednesday Friday. Metabolic acidosis present with secondary respiratory alkalosis Consult nephrology for continuation of dialysis. Continue sevelamer and PhosLo 5. COPD: Continue inhaled corticosteroid. Albuterol as needed. Spiriva per home regimen. 6. Hyperlipidemia: Continue statin therapy 7. DVT prophylaxis: Warfarin due to history of recurrent PE  8. GI prophylaxis: Pantoprazole per home regimen The patient is a full code. Time spent on admission orders and critical care approximately 45 minutes  Harrie Foreman, MD 11/11/2016, 4:21 AM

## 2016-11-11 NOTE — Progress Notes (Signed)
[facility]  ROUNDING NOTE   Subjective:  Patient presents for shortness of breath and cough Also reports wheezing No fever, chills Required BiPAP yesterday Currently on [place] Not able to speak in full sentences  Objective:  Vital signs in last 24 hours:  Temp:  [97.7 F (36.5 C)-99.1 F (37.3 C)] 97.7 F (36.5 C) (06/18 0730) Pulse Rate:  [84-115] 84 (06/18 0730) Resp:  [13-26] 19 (06/18 0730) BP: (116-131)/(89-102) 128/94 (06/18 0700) SpO2:  [96 %-100 %] 98 % (06/18 0748) FiO2 (%):  [30 %-40 %] 30 % (06/18 0506) Weight:  [76.2 kg (168 lb)-86.6 kg (190 lb 14.7 oz)] 86.6 kg (190 lb 14.7 oz) (06/18 0500)  Weight change:  Filed Weights   11/10/16 2327 11/11/16 0500  Weight: 76.2 kg (168 lb) 86.6 kg (190 lb 14.7 oz)    Intake/Output: I/O last 3 completed shifts: In: 450 [Other:50; IV Piggyback:400] Out: -    Intake/Output this shift:  No intake/output data recorded.  Physical Exam: General: No acute distress   Head: Normocephalic, atraumatic. Moist oral mucosal membranes  Eyes: Anicteric  Neck: Supple, trachea midline  Lungs:  [place] O2, b/l wheezing and crackles  Heart: S1S2 no rubs  Abdomen:  Soft, nontender,    Extremities: Left BKA, right transmetatarsal amputation   Neurologic: Awake, alert, following commands  Skin: No lesions  Access: Left IJ PermCath     Basic Metabolic Panel:  Recent Labs Lab 11/07/16 1204 11/10/16 2335  NA 133* 139  K 6.2* 5.9*  CL 101 104  CO2 17* 19*  GLUCOSE 80 98  BUN 80* 94*  CREATININE 11.54* 12.83*  CALCIUM 9.4 9.4    Liver Function Tests: No results for input(s): AST, ALT, ALKPHOS, BILITOT, PROT, ALBUMIN in the last 168 hours. No results for input(s): LIPASE, AMYLASE in the last 168 hours. No results for input(s): AMMONIA in the last 168 hours.  CBC:  Recent Labs Lab 11/07/16 1204 11/10/16 2335  WBC 6.3 10.4  NEUTROABS 3.1  --   HGB 13.8 13.6  HCT 42.4 41.0  MCV 87.9 87.7  PLT 165 186     Cardiac Enzymes:  Recent Labs Lab 11/10/16 2335 11/11/16 0504  TROPONINI 0.24* 0.23*    BNP: Invalid input(s): POCBNP  CBG:  Recent Labs Lab 11/11/16 0422 11/11/16 0751  GLUCAP 158* 181*    Microbiology: Results for orders placed or performed during the hospital encounter of 11/10/16  Blood culture (routine x 2)     Status: None (Preliminary result)   Collection Time: 11/10/16 11:35 PM  Result Value Ref Range Status   Specimen Description BLOOD LEFT FOREARM  Final   Special Requests   Final    BOTTLES DRAWN AEROBIC AND ANAEROBIC Blood Culture adequate volume   Culture NO GROWTH < 12 HOURS  Final   Report Status PENDING  Incomplete  Blood culture (routine x 2)     Status: None (Preliminary result)   Collection Time: 11/11/16  1:14 AM  Result Value Ref Range Status   Specimen Description BLOOD BLOOD RIGHT FOREARM  Final   Special Requests   Final    BOTTLES DRAWN AEROBIC AND ANAEROBIC Blood Culture results may not be optimal due to an excessive volume of blood received in culture bottles   Culture NO GROWTH < 12 HOURS  Final   Report Status PENDING  Incomplete  MRSA PCR Screening     Status: None   Collection Time: 11/11/16  4:57 AM  Result Value Ref Range  Status   MRSA by PCR NEGATIVE NEGATIVE Final    Comment:        The GeneXpert MRSA Assay (FDA approved for NASAL specimens only), is one component of a comprehensive MRSA colonization surveillance program. It is not intended to diagnose MRSA infection nor to guide or monitor treatment for MRSA infections.     Coagulation Studies:  Recent Labs  11/10/16 2335  LABPROT 25.8*  INR 2.31    Urinalysis: No results for input(s): COLORURINE, LABSPEC, PHURINE, GLUCOSEU, HGBUR, BILIRUBINUR, KETONESUR, PROTEINUR, UROBILINOGEN, NITRITE, LEUKOCYTESUR in the last 72 hours.  Invalid input(s): APPERANCEUR    Imaging: Dg Chest Portable 1 View  Result Date: 11/10/2016 CLINICAL DATA:  Acute onset of  shortness of breath.  Cough. EXAM: PORTABLE CHEST 1 VIEW COMPARISON:  11/02/2016 FINDINGS: Large bore central venous catheter in stable position. Vascular stent noted. Markedly enlarged cardiac silhouette. Mediastinal contours appear intact. Worsening of right pleural effusion and right lower thorax airspace consolidation. Mild interstitial pulmonary edema. Osseous structures are without acute abnormality. Soft tissues are grossly normal. IMPRESSION: Worsening of right pleural effusion and right lower thorax airspace consolidation. Markedly enlarged cardiac silhouette with mild interstitial pulmonary edema. Electronically Signed   By: Fidela Salisbury M.D.   On: 11/10/2016 23:51     Medications:   . [START ON 11/12/2016] ceFEPime (MAXIPIME) IV    . ferric gluconate (FERRLECIT/NULECIT) IV    . vancomycin     . albuterol  2.5 mg Nebulization Q4H  . aspirin  325 mg Oral BH-q7a  . atorvastatin  10 mg Oral QHS  . calcium acetate  667 mg Oral TID WC  . chlorhexidine  15 mL Mouth Rinse BID  . Darbepoetin Alfa  40 mcg Intravenous Q Mon-HD  . docusate sodium  100 mg Oral BID  . doxercalciferol  2.5 mcg Intravenous Q M,W,F-HD  . feeding supplement (NEPRO CARB STEADY)  237 mL Oral Daily  . fluticasone  1 spray Each Nare Daily  . isosorbide mononitrate  30 mg Oral Daily  . mouth rinse  15 mL Mouth Rinse q12n4p  . midodrine  5 mg Oral BID WC  . mometasone-formoterol  2 puff Inhalation BID  . multivitamin  1 tablet Oral QHS  . pantoprazole  40 mg Oral Daily  . polyethylene glycol  17 g Oral BH-q7a  . pregabalin  75 mg Oral BID  . sevelamer carbonate  800 mg Oral Daily  . tiotropium  18 mcg Inhalation Daily  . warfarin  2.5 mg Oral Daily   acetaminophen **OR** acetaminophen, albuterol, benzonatate, guaiFENesin-codeine, nitroGLYCERIN, ondansetron **OR** ondansetron (ZOFRAN) IV, oxyCODONE, vancomycin  Assessment/ Plan:  61 y.o. male with ESRD on hemodialysis, hypertension, hyperlipidemia, anemia,  COPD, peripheral vascular disease, left BKA, right toe amputations, Pulm Embolism 07/2016 requiring Warfarin  MWF [facility].   1.End stage renal disease with Hyperkalemia -  - Dialysis planned for today - UF goal upto 2 L as tolerated  2. Anemia of chronic kidney disease:  We will hold Epogen at this time as most recent hemoglobin > 13.  3.   Shortness of breath - Acute Pulm edema vs Bronchitis vs Pneumonia - empiric antibiotics  4. Secondary Hyperparathyroidism:  Monitor Phos Calcium acetate and Sevelamer   LOS: 0 [name] 6/18/20189:17 AM

## 2016-11-11 NOTE — Progress Notes (Signed)
Hd start 

## 2016-11-11 NOTE — Progress Notes (Signed)
Pre hd info 

## 2016-11-11 NOTE — Consult Note (Addendum)
PULMONARY / CRITICAL CARE MEDICINE   Name: [name] MRN: [id number] DOB: 1955/10/30    ADMISSION DATE:  11/10/2016   CONSULTATION DATE:  11/11/2016  REFERRING MD:  Dr. Jannifer Franklin  Reason for consultation: ICU management of acute hypoxemic respiratory failure  CHIEF COMPLAINT:  Worsening dyspnea  HISTORY OF PRESENT ILLNESS:   This is a 61 year old African-American male with a history of end-stage renal disease on hemodialysis, congestive heart failure with reduced left ventricular ejection fraction 20-25, anemia of chronic disease, coronary artery disease, obstructive sleep apnea, and current smoker who presented to the ED with worsening dyspnea and a productive cough that started more than a week ago. Patient states that symptoms have progressively got worse. He describes cough as intense, associated with green sputum, no fever or chills today, but had fever and chills 2 days ago. He goes to dialysis Monday, Wednesday, Friday and reports attending all sessions last week. Upon EMS arrival, patient was wheezing profusely. Hence, he was given DuoNeb, albuterol and Solu-Medrol. Chest x-ray shows right lower lobe pleural effusion and possible infiltrate. 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[place]); Amputee, below knee, left ([place]); Anemia; Asthma; Cardiomyopathy ([place]); CHF (congestive heart failure) ([place]); Chronic systolic heart failure ([place]); Complication of anesthesia; COPD (chronic obstructive pulmonary disease) ([place]); Coronary artery disease; Dialysis patient [facility]); End stage renal disease ([place]); GERD (gastroesophageal reflux disease); Headache; History of kidney stones; History of pulmonary embolism; HLD (hyperlipidemia); HTN (hypertension); Hyperparathyroidism; Myocardial infarction ([place]); Peripheral vascular disease ([place]); Shortness of breath  dyspnea; Sleep apnea; and Tobacco dependence.  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  Allergies  Allergen Reactions  . Dust Mite Extract Other (See Comments)    Reaction: unknown    No current facility-administered medications on file prior to encounter.    Current Outpatient Prescriptions on File Prior to Encounter  Medication Sig  . acetaminophen (TYLENOL) 500 MG tablet Take 1,000 mg by mouth every 6 (six) hours as needed.  [name] aspirin 325 MG tablet Take 325 mg by mouth every morning.   [name] atorvastatin (LIPITOR) 10 MG tablet Take 10 mg by mouth at bedtime.   . budesonide-formoterol (SYMBICORT) 160-4.5 MCG/ACT inhaler Inhale 2 puffs into the lungs 2 (two) times daily. Reported on 08/14/2015  . calcium acetate (PHOSLO) 667 MG capsule Take 667 mg by mouth 3 (three) times daily with meals.  . docusate sodium (COLACE) 100 MG capsule Take 100 mg by mouth 2 (two) times daily as needed for mild constipation. Reported on 08/14/2015  . fluticasone (FLONASE) 50 MCG/ACT nasal spray Place 1 spray into both nostrils daily.  . Ipratropium-Albuterol (COMBIVENT IN) Inhale 1 puff into the lungs every 6 (six) hours as needed (for wheezing/shortness of breath). COMBIVENT AEROSOL 18-103 MCG/ACT   . ipratropium-albuterol (DUONEB) 0.5-2.5 (3)  MG/3ML SOLN Take 3 mLs by nebulization every 4 (four) hours as needed (SOB).  . isosorbide mononitrate (IMDUR) 30 MG 24 hr tablet Take  1 tablet (30 mg total) by mouth daily.  . midodrine (PROAMATINE) 5 MG tablet 5 mg 2 (two) times daily with a meal.   . multivitamin (RENA-VIT) TABS tablet Take 1 tablet by mouth at bedtime.  . nitroGLYCERIN (NITROSTAT) 0.4 MG SL tablet Place 0.4 mg under the tongue every 5 (five) minutes as needed for chest pain.  [name] oxyCODONE (OXY IR/ROXICODONE) 5 MG immediate release tablet Take 5 mg by mouth every 8 (eight) hours as needed for severe pain.  . pantoprazole (PROTONIX) 20 MG tablet Take 20 mg by mouth daily.  . polyethylene glycol (MIRALAX / GLYCOLAX) packet Take 17 g by mouth every morning. Hold for loose stools.  . pregabalin (LYRICA) 75 MG capsule Take 75 mg by mouth 2 (two) times daily.  . sevelamer carbonate (RENVELA) 800 MG tablet Take 800 mg by mouth daily.   [name] tiotropium (SPIRIVA) 18 MCG inhalation capsule Place 18 mcg into inhaler and inhale daily.  [name] warfarin (COUMADIN) 2.5 MG tablet Take 2.5 mg by mouth daily.   [name] warfarin (COUMADIN) 3 MG tablet Take 3 mg by mouth daily.  . Darbepoetin Alfa (ARANESP) 40 MCG/0.4ML SOSY injection Inject 0.4 mLs (40 mcg total) into the vein every Monday with hemodialysis.  [name] doxercalciferol (HECTOROL) 4 MCG/2ML injection Inject 1.25 mLs (2.5 mcg total) into the vein every Monday, Wednesday, and Friday with hemodialysis.  . ferric gluconate 125 mg in sodium chloride 0.9 % 100 mL Inject 125 mg into the vein every Monday, Wednesday, and Friday with hemodialysis.  . Nutritional Supplements (FEEDING SUPPLEMENT, NEPRO CARB STEADY,) LIQD Take 237 mLs by mouth daily.  . Vancomycin (VANCOCIN) 750-5 MG/150ML-% SOLN With each dialysis, Tuesday, Thursday and Saturday. Last dose April 3rd (Patient not taking: Reported on 11/11/2016)    FAMILY HISTORY:  His indicated that his mother is deceased. He indicated that his father is deceased.  He indicated that the status of his other is unknown.    SOCIAL HISTORY: He  reports that he has been smoking Cigarettes.  He has been smoking about 0.25 packs per day. He has never used smokeless tobacco. He reports that he does not drink alcohol or use drugs.  REVIEW OF SYSTEMS:   Limited as patient is on BiPAP Constitutional: Negative for fever and chills.  HENT: Negative for congestion and rhinorrhea.  Eyes: Negative for redness and visual disturbance.  Respiratory: Positive for shortness of breath, productive cough with green sputum and wheezing.  Cardiovascular: Negative for chest pain and palpitations but positive for dyspnea.  Gastrointestinal: Negative  for nausea , vomiting and abdominal pain and  Loose stools Genitourinary: Negative for dysuria and urgency.  Endocrine: Denies polyuria, polyphagia and heat intolerance Musculoskeletal: Negative for myalgias and arthralgias.  Skin: Negative for pallor and wound.  Neurological: Negative for dizziness and headaches   SUBJECTIVE:   VITAL SIGNS: BP (!) 117/92   Pulse 94   Temp 98.2 F (36.8 C) (Axillary)   Resp (!) 24   Ht 6\' 2"  (1.88 m)   Wt 190 lb 14.7 oz (86.6 kg)   SpO2 97%   BMI 24.51 kg/m   HEMODYNAMICS:    VENTILATOR SETTINGS: FiO2 (%):  [30 %-40 %] 30 %  INTAKE / OUTPUT: No intake/output data recorded.  PHYSICAL EXAMINATION: General: Moderate respiratory distress Neuro: Alert and oriented to person, place and time, no focal deficits HEENT:  Normocephalic and atraumatic, PERRLA, mild JVD Cardiovascular: Rate and rhythm regular, S1, S2 audible, no murmur, regurg or gallop, lower extremities  without edema, +2 pulses bilaterally, left chest wall with subclavian HD catheter, right arm with multiple old HD shunts Lungs:  Moderate increase in work of breathing, bilateral breath sounds, significantly diminished in the bases, right lower lobe with crackles in [place] Abdomen:  Nondistended, normal bowel sounds,  palpation reveals no organomegaly Musculoskeletal: Left BKA, no joint swelling Skin: Severe venostasis discoloration and lower extremities  LABS:  BMET  Recent Labs Lab 11/07/16 1204 11/10/16 2335  NA 133* 139  K 6.2* 5.9*  CL 101 104  CO2 17* 19*  BUN 80* 94*  CREATININE 11.54* 12.83*  GLUCOSE 80 98    Electrolytes  Recent Labs Lab 11/07/16 1204 11/10/16 2335  CALCIUM 9.4 9.4    CBC  Recent Labs Lab 11/07/16 1204 11/10/16 2335  WBC 6.3 10.4  HGB 13.8 13.6  HCT 42.4 41.0  PLT 165 186    Coag's  Recent Labs Lab 11/07/16 1204 11/10/16 2335  APTT 40*  --   INR 1.88 2.31    Sepsis Markers  Recent Labs Lab 11/10/16 2335  PROCALCITON 0.93    ABG No results for input(s): PHART, PCO2ART, PO2ART in the last 168 hours.  Liver Enzymes No results for input(s): AST, ALT, ALKPHOS, BILITOT, ALBUMIN in the last 168 hours.  Cardiac Enzymes  Recent Labs Lab 11/10/16 2335 11/11/16 0504  TROPONINI 0.24* 0.23*    Glucose  Recent Labs Lab 11/11/16 0422  GLUCAP 158*    Imaging Dg Chest Portable 1 View  Result Date: 11/10/2016 CLINICAL DATA:  Acute onset of shortness of breath.  Cough. EXAM: PORTABLE CHEST 1 VIEW COMPARISON:  11/02/2016 FINDINGS: Large bore central venous catheter in stable position. Vascular stent noted. Markedly enlarged cardiac silhouette. Mediastinal contours appear intact. Worsening of right pleural effusion and right lower thorax airspace consolidation. Mild interstitial pulmonary edema. Osseous structures are without acute abnormality. Soft tissues are grossly normal. IMPRESSION: Worsening of right pleural effusion and right lower thorax airspace consolidation. Markedly enlarged cardiac silhouette with mild interstitial pulmonary edema. Electronically Signed   By: Fidela Salisbury M.D.   On: 11/10/2016 23:51    STUDIES:  10/29/2016. Echocardiogram showed LV EF: 20% -   25%  CULTURES: Blood cultures 2 Influenza  swab  ANTIBIOTICS: Vancomycin 11/10/2016 Cefepime 11/10/2016  SIGNIFICANT EVENTS: 11/11/2016: Admitted with acute respiratory failure  LINES/TUBES: Peripheral IVs Right subclavian HD catheter  DISCUSSION: 61 year old African-American male presenting with right lower lobe pneumonia, acute hypoxemic respiratory failure, right pleural effusion, and acute CHF exacerbation  ASSESSMENT  Healthcare associated pneumonia-RLL Acute hypoxemic respiratory failure Right pleural effusion Acute COPD exacerbation Acute CHF exacerbation-proBNP greater than 4500 Current smoker Mildly elevated troponin-0.24, likely due to myocardial strain from sepsis End-stage renal disease on hemodialysis. History of obstructive sleep apnea. History of hypertension History of coronary artery disease History of severe peripheral vascular disease, status post left BKA Anemia of chronic disease  PLAN Hemodynamic monitoring per ICU protocol Continuous BiPAP and titrate to nasal cannula last tolerated ABG and chest x-ray as needed. Nebulized bronchodilators and inhaled steroids. Antibiotics as above. Follow-up cultures Antitussive and expectorant as needed. Hemodialysis per nephrology Iron transfusions with hemodialysis per nephrology Continue all oral home medications Keep nothing by mouth while on BiPAP, except for sips with medications. GI and DVT prophylaxis Further changes in treatment plan pending patient's clinical course and diagnostics   FAMILY  - Updates: No family at bedside. Patient updated on current treatment plan  - Inter-disciplinary family meet or Palliative Care meeting due by:  day [place]. [facility] ANP-BC Pulmonary and [place] Pager [phone] or [phone] 11/11/2016, 6:27 AM    PCCM ATTENDING ATTESTATION:  I have evaluated patient with the APP [place], reviewed database in its entirety and discussed care plan in detail. In addition,  this patient was discussed on multidisciplinary rounds.   Important exam findings: Comfortable now on nasal cannula oxygen Denies pain No wheezes Bilateral crackles  Chest x-ray (reviewed by me): Cardiomegaly, right pleural effusion (appears chronic), interstitial edema and pulmonary vascular congestion  Impression: Acute hypoxemic respiratory failure Pulmonary edema ESRD Smoker Suspect component of COPD Doubt pneumonia  PLAN/REC: He is scheduled for dialysis today. After completion of dialysis consider transfer to med surge floor Continue current antibiotics for now. Will add nebulized bronchodilators Recheck chest x-ray in a.m. and if pulmonary infiltrates have cleared, would discontinue antibiotics   Merton Border, MD PCCM service Mobile [phone] Pager [phone] 11/11/2016 2:00 PM  ADD: Patient remains on nasal cannula oxygen and is presently undergoing hemodialysis. I have placed transfer orders to MedSurg floor after completion of hemodialysis. I counseled him regarding the need for smoking cessation.  Merton Border, MD PCCM service Mobile [phone] Pager [phone] 11/11/2016 3:58 PM

## 2016-11-11 NOTE — Clinical Social Work Note (Signed)
Clinical Social Work Assessment  Patient Details  Name: STARLIN STEIB MRN: [id number] Date of Birth: 1956-02-24  Date of referral:  11/11/16               Reason for consult:  Facility Placement                Permission sought to share information with:    Permission granted to share information::     Name::        Agency::     Relationship::     Contact Information:     Housing/Transportation Living arrangements for the past 2 months:  Group Home Source of Information:    Patient Interpreter Needed:  None Criminal Activity/Legal Involvement Pertinent to Current Situation/Hospitalization:  No - Comment as needed Significant Relationships:  Siblings Lives with:  Facility Resident Do you feel safe going back to the place where you live?  Yes Need for family participation in patient care:  Yes (Comment)  Care giving concerns:  Patient is a long term resident at [facility]: owner is Clement: [phone].   Social Worker assessment / plan:  Patient is known to CSW from recent admission in which a full assessment was placed on 6/6. Patient has been short of breath and required bipap yesterday. CSW had spoken to patient's sister recently: [name]: [phone] who verified that patient is from the group home and that she wished for him to return. CSW will continue to follow.  Employment status:  Disabled (Comment on whether or not currently receiving Disability) Insurance information:  Medicare PT Recommendations:    Information / Referral to community resources:     Patient/Family's Response to care:  Patient expressed appreciation for CSW assistance.  Patient/Family's Understanding of and Emotional Response to Diagnosis, Current Treatment, and Prognosis:  Patient is looking forward to being able to discharge.  Emotional Assessment Appearance:  Appears stated age Attitude/Demeanor/Rapport:   (calm) Affect (typically observed):  Calm Orientation:  Oriented to  Self, Oriented to Place, Oriented to  Time, Oriented to Situation Alcohol / Substance use:  Not Applicable Psych involvement (Current and /or in the community):  No (Comment)  Discharge Needs  Concerns to be addressed:  Care Coordination Readmission within the last 30 days:  No Current discharge risk:  None Barriers to Discharge:  No Barriers Identified   Shela Leff, LCSW 11/11/2016, 2:38 PM

## 2016-11-11 NOTE — ED Notes (Signed)
Date and time results received: 11/11/16 0035   Test: troponin Critical Value: 0.24  Name of Provider Notified: Dr. Dahlia Client  Orders Received? No new orders received at this time.

## 2016-11-11 NOTE — Care Management (Addendum)
I spoke with patient's sister. She states that he "sits outside the house in the heat smoking". No home O2 "but he needs some cause he gets so short of breath. He drives and stuff but shouldn't be".  He is able to walk with his walker. He has a wheelchair also. She states that she was not aware that home health was following however she agrees. I have notified [place] with [facility] of patient's presentation. I doubt based on what his sister is saying that [place] would assist in any way. RNCM to follow for home O2. Patient is from group home called [place].

## 2016-11-11 NOTE — Consult Note (Signed)
[name]  CARDIOLOGY CONSULT NOTE  Patient ID: [name] MRN: [id number] DOB/AGE: 12/16/1955 61 y.o.  Admit date: 11/10/2016 Referring Physician Marcille Blanco  Primary Physician [place] Primary Cardiologist Nehemiah Massed Reason for Consultation Elevated troponin, dilated cardiomyopathy  HPI: 61 year old male referred for elevated troponin and dilated cardiomyopathy. The patient has a history of end-stage renal disease, chronic systolic heart failure with LVEF 20-25% per recent echocardiogram, history of MI, status post stent proximal RCA, dyslipidemia, hypertension, hyperlipidemia, COPD, peripheral vascular disease, and a history of recurrent pulmonary embolism and DVT, currently on warfarin. Patient reports a three-day history of worsening shortness of breath and productive cough without peripheral edema. The patient reports occasional pleuritic chest pain. Admission labs notable for borderline elevated troponin of 0.24, followed by 0.23, BNP >4500. ECG revealed sinus tachycardia at a rate of 115 bpm with known LBBB. The patient was treated for healthcare acquired community pneumonia and sepsis with IV antibiotics. Most recent cardiac catheterization 07/08/2013 revealed 50% stenosis mid LAD, 40% stenosis proximal RCA, 50% stenosis distal RCA, patent stent in proximal RCA. Currently, the patient denies chest pain, palpitations, or shortness of breath, on supplemental oxygen. He complains of cough.    Past Medical History:  Diagnosis Date  . 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 Amputee, below knee, left ([place])   . Anemia   . Asthma   . Cardiomyopathy ([place])   . CHF (congestive heart failure) ([place])   . Chronic systolic heart failure ([place])   . Complication of anesthesia    hypotension  . COPD (chronic obstructive pulmonary disease) ([place])   . Coronary artery disease   . Dialysis patient ([place])    Mon, Wed, Fri  . End stage renal disease ([place])   .  GERD (gastroesophageal reflux disease)   . Headache   . History of kidney stones   . History of pulmonary embolism   . HLD (hyperlipidemia)   . HTN (hypertension)   . Hyperparathyroidism   . Myocardial infarction ([place])   . Peripheral vascular disease ([place])   . Shortness of breath dyspnea   . Sleep apnea    NO C-PAP, Patient stated in process of  "getting one"   . Tobacco dependence     Past Surgical History:  Procedure Laterality Date  . AMPUTATION Left 05/06/2014   Procedure: AMPUTATION BELOW KNEE;  Surgeon: Elam Dutch, MD;  Location: [facility];  Service: Vascular;  Laterality: Left;  . AMPUTATION Right 01/12/2015   Procedure: Foot transmetatarsal amputation;  Surgeon: Algernon Huxley, MD;  Location: [facility];  Service: Vascular;  Laterality: Right;  . APPLICATION OF WOUND VAC Right 03/01/2015   Procedure: Application of Bio-connekt graft and wound vac application to right foot ;  Surgeon: Algernon Huxley, MD;  Location: [facility];  Service: Vascular;  Laterality: Right;  . AV FISTULA PLACEMENT Left   . CARDIAC CATHETERIZATION     stent placement   . CORONARY ANGIOPLASTY    . DIALYSIS/PERMA CATHETER INSERTION N/A 07/31/2016   Procedure: Dialysis/Perma Catheter Insertion;  Surgeon: Algernon Huxley, MD;  Location: [place] [facility];  Service: Cardiovascular;  Laterality: N/A;  . LIGATION OF ARTERIOVENOUS  FISTULA Right 01/31/2016   Procedure: LIGATION OF ARTERIOVENOUS  FISTULA;  Surgeon: Algernon Huxley, MD;  Location: [facility];  Service: Vascular;  Laterality: Right;  . PERIPHERAL VASCULAR CATHETERIZATION Right 12/15/2014   Procedure: Lower Extremity Angiography;  Surgeon: Algernon Huxley, MD;  Location:  [place] [facility];  Service: Cardiovascular;  Laterality: Right;  . PERIPHERAL VASCULAR CATHETERIZATION  12/15/2014   Procedure: Lower Extremity Intervention;  Surgeon: Algernon Huxley, MD;  Location: [place] [facility];  Service: Cardiovascular;;  . PERIPHERAL VASCULAR CATHETERIZATION Right 08/14/2015    Procedure: A/V Shuntogram/Fistulagram;  Surgeon: Algernon Huxley, MD;  Location: [place] [facility];  Service: Cardiovascular;  Laterality: Right;  . PERIPHERAL VASCULAR CATHETERIZATION N/A 08/14/2015   Procedure: A/V Shunt Intervention;  Surgeon: Algernon Huxley, MD;  Location: [place] [facility];  Service: Cardiovascular;  Laterality: N/A;  . PERIPHERAL VASCULAR CATHETERIZATION N/A 01/11/2016   Procedure: Dialysis/Perma Catheter Insertion;  Surgeon: Algernon Huxley, MD;  Location: [place] [facility];  Service: Cardiovascular;  Laterality: N/A;  . REVISON OF ARTERIOVENOUS FISTULA Right 02/17/2016   Procedure: removal of AV fistula;  Surgeon: Serafina Mitchell, MD;  Location: [facility];  Service: Vascular;  Laterality: Right;  . REVISON OF ARTERIOVENOUS FISTULA Right 01/31/2016   Procedure: REVISON OF ARTERIOVENOUS FISTULA ( BRACHIOCEPHALIC ) W/ ARTEGRAFT;  Surgeon: Algernon Huxley, MD;  Location: [facility];  Service: Vascular;  Laterality: Right;  . TRANSMETATARSAL AMPUTATION Right 05/04/2015   Procedure: TRANSMETATARSAL AMPUTATION REVISION, great toe amputation;  Surgeon: Algernon Huxley, MD;  Location: [facility];  Service: Vascular;  Laterality: Right;    Facility-Administered Medications Prior to Admission  Medication Dose Route Frequency Provider Last Rate Last Dose  . vancomycin (VANCOCIN) 1,000 mg in sodium chloride 0.9 % 500 mL IVPB  1,000 mg Intravenous Once Stegmayer, Janalyn Harder, PA-C       Prescriptions Prior to Admission  Medication Sig Dispense Refill Last Dose  . acetaminophen (TYLENOL) 500 MG tablet Take 1,000 mg by mouth every 6 (six) hours as needed.     [name] aspirin 325 MG tablet Take 325 mg by mouth every morning.    11/10/2016 at Unknown time  . atorvastatin (LIPITOR) 10 MG tablet Take 10 mg by mouth at bedtime.    11/10/2016 at Unknown time  . budesonide-formoterol (SYMBICORT) 160-4.5 MCG/ACT inhaler Inhale 2 puffs into the lungs 2 (two) times daily. Reported on 08/14/2015   11/10/2016 at Unknown time  .  calcium acetate (PHOSLO) 667 MG capsule Take 667 mg by mouth 3 (three) times daily with meals.   11/10/2016 at Unknown time  . docusate sodium (COLACE) 100 MG capsule Take 100 mg by mouth 2 (two) times daily as needed for mild constipation. Reported on 08/14/2015   Taking  . fluticasone (FLONASE) 50 MCG/ACT nasal spray Place 1 spray into both nostrils daily.   Taking  . Ipratropium-Albuterol (COMBIVENT IN) Inhale 1 puff into the lungs every 6 (six) hours as needed (for wheezing/shortness of breath). COMBIVENT AEROSOL 18-103 MCG/ACT    Taking  . ipratropium-albuterol (DUONEB) 0.5-2.5 (3) MG/3ML SOLN Take 3 mLs by nebulization every 4 (four) hours as needed (SOB).   Taking  . isosorbide mononitrate (IMDUR) 30 MG 24 hr tablet Take 1 tablet (30 mg total) by mouth daily. 30 tablet 2 11/10/2016 at Unknown time  . midodrine (PROAMATINE) 5 MG tablet 5 mg 2 (two) times daily with a meal.    11/10/2016 at Unknown time  . multivitamin (RENA-VIT) TABS tablet Take 1 tablet by mouth at bedtime.  0 11/10/2016 at Unknown time  . nitroGLYCERIN (NITROSTAT) 0.4 MG SL tablet Place 0.4 mg under the tongue every 5 (five) minutes as needed for chest pain.   Taking  . oxyCODONE (OXY IR/ROXICODONE) 5 MG  immediate release tablet Take 5 mg by mouth every 8 (eight) hours as needed for severe pain.   11/10/2016 at Unknown time  . pantoprazole (PROTONIX) 20 MG tablet Take 20 mg by mouth daily.   11/10/2016 at Unknown time  . polyethylene glycol (MIRALAX / GLYCOLAX) packet Take 17 g by mouth every morning. Hold for loose stools.   11/10/2016 at Unknown time  . pregabalin (LYRICA) 75 MG capsule Take 75 mg by mouth 2 (two) times daily.   11/10/2016 at Unknown time  . sevelamer carbonate (RENVELA) 800 MG tablet Take 800 mg by mouth daily.    11/10/2016 at Unknown time  . tiotropium (SPIRIVA) 18 MCG inhalation capsule Place 18 mcg into inhaler and inhale daily.   11/10/2016 at Unknown time  . warfarin (COUMADIN) 2.5 MG tablet Take 2.5 mg by mouth  daily.    11/10/2016 at Unknown time  . warfarin (COUMADIN) 3 MG tablet Take 3 mg by mouth daily.     . Darbepoetin Alfa (ARANESP) 40 MCG/0.4ML SOSY injection Inject 0.4 mLs (40 mcg total) into the vein every Monday with hemodialysis. 8.4 mL  Taking  . doxercalciferol (HECTOROL) 4 MCG/2ML injection Inject 1.25 mLs (2.5 mcg total) into the vein every Monday, Wednesday, and Friday with hemodialysis. 2 mL  Taking  . ferric gluconate 125 mg in sodium chloride 0.9 % 100 mL Inject 125 mg into the vein every Monday, Wednesday, and Friday with hemodialysis.   Taking  . Nutritional Supplements (FEEDING SUPPLEMENT, NEPRO CARB STEADY,) LIQD Take 237 mLs by mouth daily.   Taking  . Vancomycin (VANCOCIN) 750-5 MG/150ML-% SOLN With each dialysis, Tuesday, Thursday and Saturday. Last dose April 3rd (Patient not taking: Reported on 11/11/2016) 4000 mL  Completed Course at Unknown time   Social History   Social History  . Marital status: Widowed    Spouse name: N/A  . Number of children: N/A  . Years of education: N/A   Occupational History  . Not on file.   Social History Main Topics  . Smoking status: Light Tobacco Smoker    Packs/day: 0.25    Types: Cigarettes  . Smokeless tobacco: Never Used     Comment: 5  . Alcohol use No  . Drug use: No     Comment: has used crack cocaine in past   . Sexual activity: Not Currently    Birth control/ protection: Abstinence   Other Topics Concern  . Not on file   Social History Narrative   Engaged.     Family History  Problem Relation Age of Onset  . Heart failure Other   . Hypertension Other   . Leukemia Other   . Diabetes Other       Review of systems complete and found to be negative unless listed above      PHYSICAL EXAM  General: No acute distress HEENT:  Normocephalic and atramatic Neck:  No JVD.  Lungs: Increased effort of breathing. Course rhonchi throughout, barky cough induced by deep inspiration Heart: Tachycardia, Normal S1 and  S2 without gallops or murmurs.  Abdomen: Bowel sounds are positive, abdomen soft and non-tender  Msk:  Back normal, able to sit upright.  Extremities: No edema. Left BKA, right toes amputated Neuro: Alert and oriented X 3. Psych:  Good affect, responds appropriately  Labs:   Lab Results  Component Value Date   WBC 10.4 11/10/2016   HGB 13.6 11/10/2016   HCT 41.0 11/10/2016   MCV 87.7 11/10/2016   PLT  186 11/10/2016    Recent Labs Lab 11/10/16 2335  NA 139  K 5.9*  CL 104  CO2 19*  BUN 94*  CREATININE 12.83*  CALCIUM 9.4  GLUCOSE 98   Lab Results  Component Value Date   CKTOTAL 276 07/06/2013   CKMB 3.2 03/16/2014   TROPONINI 0.23 (HH) 11/11/2016    Lab Results  Component Value Date   CHOL 131 07/26/2016   Lab Results  Component Value Date   HDL 45 07/26/2016   Lab Results  Component Value Date   LDLCALC 47 07/26/2016   Lab Results  Component Value Date   TRIG 196 (H) 07/26/2016   Lab Results  Component Value Date   CHOLHDL 2.9 07/26/2016   No results found for: LDLDIRECT    Radiology: Dg Chest 2 View  Result Date: 11/02/2016 CLINICAL DATA:  Cough, shortness of breath EXAM: CHEST  2 VIEW COMPARISON:  10/29/2016 FINDINGS: Small right pleural effusion/thickening with associated right lower lobe scarring, chronic. Left lung is essentially clear. No frank interstitial edema. No pneumothorax. Cardiomegaly. Left IJ dual lumen dialysis catheter terminating in the upper right atrium. Axillary vascular stent. IMPRESSION: Small right pleural effusion/thickening with associated right lower lobe scarring, chronic. No frank interstitial edema. Electronically Signed   By: Julian Hy M.D.   On: 11/02/2016 21:03   Dg Chest Portable 1 View  Result Date: 11/10/2016 CLINICAL DATA:  Acute onset of shortness of breath.  Cough. EXAM: PORTABLE CHEST 1 VIEW COMPARISON:  11/02/2016 FINDINGS: Large bore central venous catheter in stable position. Vascular stent noted.  Markedly enlarged cardiac silhouette. Mediastinal contours appear intact. Worsening of right pleural effusion and right lower thorax airspace consolidation. Mild interstitial pulmonary edema. Osseous structures are without acute abnormality. Soft tissues are grossly normal. IMPRESSION: Worsening of right pleural effusion and right lower thorax airspace consolidation. Markedly enlarged cardiac silhouette with mild interstitial pulmonary edema. Electronically Signed   By: Fidela Salisbury M.D.   On: 11/10/2016 23:51   Dg Chest Port 1 View  Result Date: 10/29/2016 CLINICAL DATA:  Respiratory failure end-stage renal disease on dialysis, CHF, asthma -COPD. EXAM: PORTABLE CHEST 1 VIEW COMPARISON:  Portable chest x-ray of October 28, 2016 FINDINGS: The lungs are mildly hypoinflated. The interstitial markings are increased and more conspicuous today. There is persistent confluent density inferior laterally on the right compatible with pleural fluid. The cardiac silhouette remains enlarged. The pulmonary vascularity is indistinct. The dual-lumen dialysis catheter tip projects at the cavoatrial junction. IMPRESSION: Mild interval worsening of pulmonary edema. Persistent loculated pleural fluid at the right lung base. I cannot exclude right basilar atelectasis or pneumonia. Electronically Signed   By: David  Martinique M.D.   On: 10/29/2016 07:26   Dg Chest Portable 1 View  Result Date: 10/28/2016 CLINICAL DATA:  Sudden onset of shortness of breath this morning. EXAM: PORTABLE CHEST 1 VIEW COMPARISON:  Most recent comparison radiographs 09/19/2016. Chest CT 08/03/2016 FINDINGS: Left-sided dialysis catheter tip remains at the atrial caval junction. Progressive cardiomegaly from prior exam. Small to moderate pleural effusion and basilar opacity, possible increase from prior versus differences in technique. Pulmonary edema appears similar. No new airspace disease. Vascular stent in the right axilla. IMPRESSION: Progressive  cardiomegaly. Right pleural effusion with equivocal increase in size from prior. Pulmonary edema appears similar. Suspect CHF exacerbation. Electronically Signed   By: Jeb Levering M.D.   On: 10/28/2016 06:16    EKG: Sinus tachycardia  ASSESSMENT AND PLAN:  1. Borderline elevated troponin, trending down,  in the absence of chest pain or evidence of ischemia on ECG, with history of CAD, likely demand supply ischemia in the setting of ESRD, respiratory failure with hypoxia due to acute on chronic systolic heart failure and pulmonary edema, being treated for HCAP. 2. Respiratory failure with hypoxia secondary to acute on chronic systolic heart failure, COPD, and HCAP vs bronchitis 3. Dilated cardiomyopathy, LV EF 20-25% 3. Sepsis with healthcare acquired pneumonia 4. ESRD   Recommendations: 1. Agree with current therapy 2. Continue IV antibiotics  3. Dialysis planned for today 4. Defer functional study at this time  5. Further recommendations pending patient's initial course  Signed: Clabe Seal, PA-C 11/11/2016, 9:00 AM

## 2016-11-11 NOTE — ED Notes (Signed)
CCU RN requested pt have second IV site before coming to the unit. This RN will place.

## 2016-11-11 NOTE — Progress Notes (Signed)
Pharmacy Antibiotic Note  [name] is a 61 y.o. male admitted on 11/10/2016 with pneumonia.  Pharmacy has been consulted for vancomycin and cefepime dosing.  Plan: HD patient Vancomycin 1750 mg load dose ordered. 750 mg every HD ordered. Will need level scheduled before 3rd HD session.  Cefepime 1 gram q 24 hours ordered.  Height: 6\' 2"  (188 cm) Weight: 168 lb (76.2 kg) IBW/kg (Calculated) : 82.2  Temp (24hrs), Avg:99.1 F (37.3 C), Min:99.1 F (37.3 C), Max:99.1 F (37.3 C)   Recent Labs Lab 11/07/16 1204 11/10/16 2335  WBC 6.3 10.4  CREATININE 11.54* 12.83*    Estimated Creatinine Clearance: 6.4 mL/min (A) (by C-G formula based on SCr of 12.83 mg/dL (H)).    Allergies  Allergen Reactions  . Dust Mite Extract Other (See Comments)    Reaction: unknown    Antimicrobials this admission: Vancomycin cefepime 6/18  >>    >>   Dose adjustments this admission:   Microbiology results: 6/17 BCx: pending 6/4 MRSA (+)      6/17 CXR: right lower thorax airspace consolidation  Thank you for allowing pharmacy to be a part of this patient's care.  Keelyn Monjaras S 11/11/2016 3:52 AM

## 2016-11-11 NOTE — Progress Notes (Signed)
Patient being transferred to room 222 at this time. Focused assessment complete. Patient alert and oriented x 4. VSS. Lungs CTA Patient denies SOB or difficulty breathing. SPO2 94% on room air. Report called to accepting nurse on floor.

## 2016-11-11 NOTE — Care Management (Addendum)
Per previous RNCM note patient is from [place] group home followed by [facility] health for nursing and PT. I have notified [place] with [facility] of this admission. Did not qualify for home O2 on last admission. Shows six presentations over last 6 months.

## 2016-11-12 ENCOUNTER — Inpatient Hospital Stay: Payer: Medicare Other

## 2016-11-12 LAB — BASIC METABOLIC PANEL
Anion gap: 12 (ref 5–15)
BUN: 82 mg/dL — AB (ref 6–20)
CHLORIDE: 102 mmol/L (ref 101–111)
CO2: 23 mmol/L (ref 22–32)
CREATININE: 10.58 mg/dL — AB (ref 0.61–1.24)
Calcium: 9.2 mg/dL (ref 8.9–10.3)
GFR calc non Af Amer: 5 mL/min — ABNORMAL LOW (ref >60)
GFR, EST AFRICAN AMERICAN: 5 mL/min — AB (ref >60)
Glucose, Bld: 109 mg/dL — ABNORMAL HIGH (ref 65–99)
POTASSIUM: 5.6 mmol/L — AB (ref 3.5–5.1)
Sodium: 137 mmol/L (ref 135–145)

## 2016-11-12 LAB — CBC
HEMATOCRIT: 39.3 % — AB (ref 40.0–52.0)
Hemoglobin: 12.8 g/dL — ABNORMAL LOW (ref 13.0–18.0)
MCH: 28.8 pg (ref 26.0–34.0)
MCHC: 32.7 g/dL (ref 32.0–36.0)
MCV: 88 fL (ref 80.0–100.0)
PLATELETS: 156 10*3/uL (ref 150–440)
RBC: 4.46 MIL/uL (ref 4.40–5.90)
RDW: 15.5 % — ABNORMAL HIGH (ref 11.5–14.5)
WBC: 9.5 10*3/uL (ref 3.8–10.6)

## 2016-11-12 LAB — PROTIME-INR
INR: 2
Prothrombin Time: 23 seconds — ABNORMAL HIGH (ref 11.4–15.2)

## 2016-11-12 LAB — PROCALCITONIN: PROCALCITONIN: 2.61 ng/mL

## 2016-11-12 MED ORDER — DEXTROSE 5 % IV SOLN
1.0000 g | INTRAVENOUS | Status: DC
  Administered 2016-11-12: 1 g via INTRAVENOUS
  Filled 2016-11-12 (×3): qty 1

## 2016-11-12 NOTE — Progress Notes (Signed)
Telemetry showing tachycardia 150's and RA lead off; assessed patient: AP 80, all leads secured, dots and wires replaced; patient denies any distress at this time. Barbaraann Faster, RN 1:45 AM 11/12/2016

## 2016-11-12 NOTE — Progress Notes (Signed)
[facility]  ROUNDING NOTE   Subjective:  Patient presents for shortness of breath, cough, wheezing No fever, chills  feels better after 2500 cc removed with HD Able to eat without nausea or vomiting  Objective:  Vital signs in last 24 hours:  Temp:  [97.6 F (36.4 C)-98.1 F (36.7 C)] 97.6 F (36.4 C) (06/19 1649) Pulse Rate:  [74-90] 86 (06/19 1649) Resp:  [12-18] 17 (06/19 1649) BP: (95-146)/(76-89) 118/87 (06/19 1649) SpO2:  [92 %-98 %] 95 % (06/19 1649) Weight:  [85.5 kg (188 lb 6.4 oz)] 85.5 kg (188 lb 6.4 oz) (06/19 0500)  Weight change: 10.4 kg (22 lb 14.7 oz) Filed Weights   11/11/16 0500 11/11/16 1420 11/12/16 0500  Weight: 86.6 kg (190 lb 14.7 oz) 86.6 kg (190 lb 14.7 oz) 85.5 kg (188 lb 6.4 oz)    Intake/Output: I/O last 3 completed shifts: In: 790 [P.O.:240; Other:50; IV Piggyback:500] Out: 2500 [Other:2500]   Intake/Output this shift:  No intake/output data recorded.  Physical Exam: General: No acute distress   Head: Normocephalic, atraumatic. Moist oral mucosal membranes  Eyes: Anicteric  Neck: Supple, trachea midline  Lungs:  b/l wheezing and crackles  Heart: S1S2 no rubs  Abdomen:  Soft, nontender,    Extremities: Left BKA, right transmetatarsal amputation   Neurologic: Awake, alert, following commands  Skin: No lesions  Access: Left IJ PermCath     Basic Metabolic Panel:  Recent Labs Lab 11/07/16 1204 11/10/16 2335 11/12/16 0529  NA 133* 139 137  K 6.2* 5.9* 5.6*  CL 101 104 102  CO2 17* 19* 23  GLUCOSE 80 98 109*  BUN 80* 94* 82*  CREATININE 11.54* 12.83* 10.58*  CALCIUM 9.4 9.4 9.2    Liver Function Tests: No results for input(s): AST, ALT, ALKPHOS, BILITOT, PROT, ALBUMIN in the last 168 hours. No results for input(s): LIPASE, AMYLASE in the last 168 hours. No results for input(s): AMMONIA in the last 168 hours.  CBC:  Recent Labs Lab 11/07/16 1204 11/10/16 2335 11/12/16 0529  WBC 6.3 10.4 9.5  NEUTROABS  3.1  --   --   HGB 13.8 13.6 12.8*  HCT 42.4 41.0 39.3*  MCV 87.9 87.7 88.0  PLT 165 186 156    Cardiac Enzymes:  Recent Labs Lab 11/10/16 2335 11/11/16 0504 11/11/16 1029 11/11/16 1558  TROPONINI 0.24* 0.23* 0.21* 0.21*    BNP: Invalid input(s): POCBNP  CBG:  Recent Labs Lab 11/11/16 0422 11/11/16 0751 11/11/16 1142 11/11/16 1638  GLUCAP 158* 181* 141* 152*    Microbiology: Results for orders placed or performed during the hospital encounter of 11/10/16  Blood culture (routine x 2)     Status: None (Preliminary result)   Collection Time: 11/10/16 11:35 PM  Result Value Ref Range Status   Specimen Description BLOOD LEFT FOREARM  Final   Special Requests   Final    BOTTLES DRAWN AEROBIC AND ANAEROBIC Blood Culture adequate volume   Culture NO GROWTH 1 DAY  Final   Report Status PENDING  Incomplete  Blood culture (routine x 2)     Status: None (Preliminary result)   Collection Time: 11/11/16  1:14 AM  Result Value Ref Range Status   Specimen Description BLOOD BLOOD RIGHT FOREARM  Final   Special Requests   Final    BOTTLES DRAWN AEROBIC AND ANAEROBIC Blood Culture results may not be optimal due to an excessive volume of blood received in culture bottles   Culture NO GROWTH 1 DAY  Final   Report Status PENDING  Incomplete  MRSA PCR Screening     Status: None   Collection Time: 11/11/16  4:57 AM  Result Value Ref Range Status   MRSA by PCR NEGATIVE NEGATIVE Final    Comment:        The GeneXpert MRSA Assay (FDA approved for NASAL specimens only), is one component of a comprehensive MRSA colonization surveillance program. It is not intended to diagnose MRSA infection nor to guide or monitor treatment for MRSA infections.     Coagulation Studies:  Recent Labs  11/10/16 2335 11/12/16 0529  LABPROT 25.8* 23.0*  INR 2.31 2.00    Urinalysis: No results for input(s): COLORURINE, LABSPEC, PHURINE, GLUCOSEU, HGBUR, BILIRUBINUR, KETONESUR, PROTEINUR,  UROBILINOGEN, NITRITE, LEUKOCYTESUR in the last 72 hours.  Invalid input(s): APPERANCEUR    Imaging: Dg Chest Port 1 View  Result Date: 11/12/2016 CLINICAL DATA:  Follow-up examination for respiratory failure, cough, shortness of breath. EXAM: PORTABLE CHEST 1 VIEW COMPARISON:  Prior radiograph from 11/10/2016. FINDINGS: Left-sided hemodialysis catheter in place, stable. Advanced cardiomegaly. Mediastinal silhouette unchanged. Aortic atherosclerosis. Vascular stent noted at the right subclavian region. Lungs normally inflated. Vascular congestion with interstitial prominence, suggesting pulmonary interstitial edema. Right pleural effusion with associated right basilar opacity, which may reflect atelectasis or infiltrate, overall similar to previous. No new focal airspace disease. No pneumothorax. Osseous structures unchanged. IMPRESSION: 1. Little interval change in appearance of right pleural effusion with associated right lower lobe opacity, either atelectasis or infiltrate. 2. Stable cardiomegaly with underlying mild diffuse pulmonary interstitial edema. Electronically Signed   By: Jeannine Boga M.D.   On: 11/12/2016 06:28   Dg Chest Portable 1 View  Result Date: 11/10/2016 CLINICAL DATA:  Acute onset of shortness of breath.  Cough. EXAM: PORTABLE CHEST 1 VIEW COMPARISON:  11/02/2016 FINDINGS: Large bore central venous catheter in stable position. Vascular stent noted. Markedly enlarged cardiac silhouette. Mediastinal contours appear intact. Worsening of right pleural effusion and right lower thorax airspace consolidation. Mild interstitial pulmonary edema. Osseous structures are without acute abnormality. Soft tissues are grossly normal. IMPRESSION: Worsening of right pleural effusion and right lower thorax airspace consolidation. Markedly enlarged cardiac silhouette with mild interstitial pulmonary edema. Electronically Signed   By: Fidela Salisbury M.D.   On: 11/10/2016 23:51      Medications:   . ceFEPime (MAXIPIME) IV 1 g (11/12/16 1658)  . ferric gluconate (FERRLECIT/NULECIT) IV Stopped (11/11/16 1802)   . aspirin  325 mg Oral Daily  . atorvastatin  10 mg Oral QHS  . calcium acetate  667 mg Oral TID WC  . chlorhexidine  15 mL Mouth Rinse BID  . Darbepoetin Alfa  40 mcg Intravenous Q Mon-HD  . docusate sodium  100 mg Oral BID  . doxercalciferol  2.5 mcg Intravenous Q M,W,F-HD  . feeding supplement (NEPRO CARB STEADY)  237 mL Oral Daily  . fluticasone  1 spray Each Nare Daily  . ipratropium-albuterol  3 mL Nebulization Q6H  . isosorbide mononitrate  30 mg Oral Daily  . mouth rinse  15 mL Mouth Rinse q12n4p  . midodrine  5 mg Oral BID WC  . mometasone-formoterol  2 puff Inhalation BID  . multivitamin  1 tablet Oral QHS  . pantoprazole  40 mg Oral Daily  . polyethylene glycol  17 g Oral BH-q7a  . pregabalin  75 mg Oral BID  . sevelamer carbonate  800 mg Oral Q breakfast  . tiotropium  18 mcg Inhalation Daily  .  warfarin  2.5 mg Oral Daily   albuterol, benzonatate, guaiFENesin-codeine, nitroGLYCERIN, ondansetron **OR** ondansetron (ZOFRAN) IV, oxyCODONE  Assessment/ Plan:  61 y.o. male with ESRD on hemodialysis, hypertension, hyperlipidemia, anemia, COPD, peripheral vascular disease, left BKA, right toe amputations, Pulm Embolism 07/2016 requiring Warfarin  [facility].   1.End stage renal disease with Hyperkalemia -  - Dialysis planned for tomorrow - UF goal upto 2 L as tolerated  2. Anemia of chronic kidney disease:  We will hold Epogen at this time as most recent hemoglobin is 12.8.  3.   Shortness of breath - Acute Pulm edema vs Bronchitis vs Pneumonia - empiric antibiotics - improved after 2500 cc removed  4. Secondary Hyperparathyroidism:  Monitor Phos Calcium acetate and Sevelamer   LOS: 1 Gabe Glace 6/19/20185:15 PM

## 2016-11-12 NOTE — Progress Notes (Signed)
ANTICOAGULATION CONSULT NOTE - Initial Consult  Pharmacy Consult for warfarin dosing Indication: h/o recurrent PE  Allergies  Allergen Reactions  . Dust Mite Extract Other (See Comments)    Reaction: unknown    Patient Measurements: Height: 6\' 2"  (188 cm) Weight: 188 lb 6.4 oz (85.5 kg) IBW/kg (Calculated) : 82.2 Heparin Dosing Weight: n/a  Vital Signs: Temp: 98.1 F (36.7 C) (06/19 0649) Temp Source: Oral (06/19 0649) BP: 112/76 (06/19 0649) Pulse Rate: 90 (06/19 0649)  Labs:  Recent Labs  11/10/16 2335 11/11/16 0504 11/11/16 1029 11/11/16 1558 11/12/16 0529  HGB 13.6  --   --   --  12.8*  HCT 41.0  --   --   --  39.3*  PLT 186  --   --   --  156  LABPROT 25.8*  --   --   --  23.0*  INR 2.31  --   --   --  2.00  CREATININE 12.83*  --   --   --  10.58*  TROPONINI 0.24* 0.23* 0.21* 0.21*  --     Estimated Creatinine Clearance: 8.4 mL/min (A) (by C-G formula based on SCr of 10.58 mg/dL (H)).   Medical History: Past Medical History:  Diagnosis Date  . 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 Amputee, below knee, left ([place])   . Anemia   . Asthma   . Cardiomyopathy ([place])   . CHF (congestive heart failure) ([place])   . Chronic systolic heart failure ([place])   . Complication of anesthesia    hypotension  . COPD (chronic obstructive pulmonary disease) ([place])   . Coronary artery disease   . Dialysis patient ([place])    Mon, Wed, Fri  . End stage renal disease ([place])   . GERD (gastroesophageal reflux disease)   . Headache   . History of kidney stones   . History of pulmonary embolism   . HLD (hyperlipidemia)   . HTN (hypertension)   . Hyperparathyroidism   . Myocardial infarction ([place])   . Peripheral vascular disease ([place])   . Shortness of breath dyspnea   . Sleep apnea    NO C-PAP, Patient stated in process of  "getting one"   . Tobacco dependence     Medications:  Home dose 2.5 mg daily per med rec and review of old notes.  Assessment: INR  therapeutic on admission.  Goal of Therapy:  INR 2-3  Date  INR  Dose 6/17  2.31 6/18    2.5 mg (given 11/12/16 at 0219) 6/19  2     Plan:  Continue home dose of 2.5 mg daily. INR daily while on antibiotic therapy.  Laural Benes, Pharm.D., BCPS Clinical Pharmacist 11/12/2016,9:56 AM

## 2016-11-12 NOTE — Progress Notes (Signed)
[facility] Cardiology  SUBJECTIVE: Patient reports feeling much better today. He denies chest pain or shortness of breath.    Vitals:   11/12/16 0137 11/12/16 0251 11/12/16 0500 11/12/16 0649  BP:    112/76  Pulse: 80   90  Resp:    16  Temp:    98.1 F (36.7 C)  TempSrc:    Oral  SpO2:  94%  98%  Weight:   85.5 kg (188 lb 6.4 oz)   Height:         Intake/Output Summary (Last 24 hours) at 11/12/16 1321 Last data filed at 11/11/16 2046  Gross per 24 hour  Intake              340 ml  Output             2500 ml  Net            -2160 ml      PHYSICAL EXAM  General: Well developed, well nourished, in no acute distress HEENT:  Normocephalic and atramatic Neck:  No JVD.  Lungs: Normal effort of breathing, faint expiratory wheezing, diffuse bibasilar crackles Heart: HRRR . Normal S1 and S2 without gallops or murmurs.  Abdomen: Bowel sounds are positive, abdomen soft and non-tender  Msk:  Back normal, able to sit upright in bed Extremities: No edema. Left BKA, right toes amputated Neuro: Alert and oriented X 3. Psych:  Good affect, responds appropriately   LABS: Basic Metabolic Panel:  Recent Labs  11/10/16 2335 11/12/16 0529  NA 139 137  K 5.9* 5.6*  CL 104 102  CO2 19* 23  GLUCOSE 98 109*  BUN 94* 82*  CREATININE 12.83* 10.58*  CALCIUM 9.4 9.2   Liver Function Tests: No results for input(s): AST, ALT, ALKPHOS, BILITOT, PROT, ALBUMIN in the last 72 hours. No results for input(s): LIPASE, AMYLASE in the last 72 hours. CBC:  Recent Labs  11/10/16 2335 11/12/16 0529  WBC 10.4 9.5  HGB 13.6 12.8*  HCT 41.0 39.3*  MCV 87.7 88.0  PLT 186 156   Cardiac Enzymes:  Recent Labs  11/11/16 0504 11/11/16 1029 11/11/16 1558  TROPONINI 0.23* 0.21* 0.21*   BNP: Invalid input(s): POCBNP D-Dimer: No results for input(s): DDIMER in the last 72 hours. Hemoglobin A1C: No results for input(s): HGBA1C in the last 72 hours. Fasting Lipid Panel: No results for  input(s): CHOL, HDL, LDLCALC, TRIG, CHOLHDL, LDLDIRECT in the last 72 hours. Thyroid Function Tests:  Recent Labs  11/11/16 0504  TSH 0.286*   Anemia Panel: No results for input(s): VITAMINB12, FOLATE, FERRITIN, TIBC, IRON, RETICCTPCT in the last 72 hours.  Dg Chest Port 1 View  Result Date: 11/12/2016 CLINICAL DATA:  Follow-up examination for respiratory failure, cough, shortness of breath. EXAM: PORTABLE CHEST 1 VIEW COMPARISON:  Prior radiograph from 11/10/2016. FINDINGS: Left-sided hemodialysis catheter in place, stable. Advanced cardiomegaly. Mediastinal silhouette unchanged. Aortic atherosclerosis. Vascular stent noted at the right subclavian region. Lungs normally inflated. Vascular congestion with interstitial prominence, suggesting pulmonary interstitial edema. Right pleural effusion with associated right basilar opacity, which may reflect atelectasis or infiltrate, overall similar to previous. No new focal airspace disease. No pneumothorax. Osseous structures unchanged. IMPRESSION: 1. Little interval change in appearance of right pleural effusion with associated right lower lobe opacity, either atelectasis or infiltrate. 2. Stable cardiomegaly with underlying mild diffuse pulmonary interstitial edema. Electronically Signed   By: Jeannine Boga M.D.   On: 11/12/2016 06:28   Dg Chest Portable 1 View  Result Date: 11/10/2016 CLINICAL DATA:  Acute onset of shortness of breath.  Cough. EXAM: PORTABLE CHEST 1 VIEW COMPARISON:  11/02/2016 FINDINGS: Large bore central venous catheter in stable position. Vascular stent noted. Markedly enlarged cardiac silhouette. Mediastinal contours appear intact. Worsening of right pleural effusion and right lower thorax airspace consolidation. Mild interstitial pulmonary edema. Osseous structures are without acute abnormality. Soft tissues are grossly normal. IMPRESSION: Worsening of right pleural effusion and right lower thorax airspace consolidation.  Markedly enlarged cardiac silhouette with mild interstitial pulmonary edema. Electronically Signed   By: Fidela Salisbury M.D.   On: 11/10/2016 23:51     Echo: EF 20-25%  TELEMETRY: Normal sinus rhythm, 80 bpm  ASSESSMENT AND PLAN:  Active Problems:   Respiratory failure with hypoxia (HCC)    1. Borderline elevated troponin, trended down to 0.21 from 0.24, likely demand supply ischemia, less likely ACS in the absence of chest pain or ECG changes, in the setting of respiratory failure with hypoxia and sepsis. 2. Respiratory failure with hypoxia secondary to pulmonary edema, clinically improving, off of supplemental oxygen, breathing better. 3. Acute on chronic systolic heart failure, clinically improved 4. Dilated cardiomyopathy with EF 20-25% 5. ESRD 6. COPD 7. Sepsis   Recommendations: 1. Continue current therapy 2. No further cardiac diagnostics at this time. 3. Recommend follow-up with Dr. Nehemiah Massed as outpatient in 1 week.   Clabe Seal, PA-C 11/12/2016 1:21 PM

## 2016-11-12 NOTE — Progress Notes (Signed)
[facility] [place] NAME: [name]    MR#:  [id number]  DATE OF BIRTH:  1955/06/30  SUBJECTIVE:   Patient here due to shortness of breath and noted to be hypoxic secondary to pulmonary edema and also underlying pneumonia.  Shortness of breath improved, still having a cough which is nonproductive. Afebrile. Overall better since yesterday.  REVIEW OF SYSTEMS:    Review of Systems  Constitutional: Negative for chills and fever.  HENT: Negative for congestion and tinnitus.   Eyes: Negative for blurred vision and double vision.  Respiratory: Positive for cough and shortness of breath. Negative for wheezing.   Cardiovascular: Negative for chest pain, orthopnea and PND.  Gastrointestinal: Negative for abdominal pain, diarrhea, nausea and vomiting.  Genitourinary: Negative for dysuria and hematuria.  Neurological: Negative for dizziness, sensory change and focal weakness.  All other systems reviewed and are negative.   Nutrition: Renal diet Tolerating Diet: yes Tolerating PT: Await Eval.   DRUG ALLERGIES:   Allergies  Allergen Reactions  . Dust Mite Extract Other (See Comments)    Reaction: unknown    VITALS:  Blood pressure 112/76, pulse 90, temperature 98.1 F (36.7 C), temperature source Oral, resp. rate 16, height 6\' 2"  (1.88 m), weight 85.5 kg (188 lb 6.4 oz), SpO2 98 %.  PHYSICAL EXAMINATION:   Physical Exam  GENERAL:  61 y.o.-year-old patient lying in bed in NAD  EYES: Pupils equal, round, reactive to light and accommodation. No scleral icterus. Extraocular muscles intact.  HEENT: Head atraumatic, normocephalic. Oropharynx and nasopharynx clear.  NECK:  Supple, no jugular venous distention. No thyroid enlargement, no tenderness.  LUNGS: Good A/e b/l, no wheezing, bibasilar rales, No rhonchi. No use of accessory muscles of respiration.  CARDIOVASCULAR: S1, S2 normal. No murmurs, rubs, or gallops.  ABDOMEN: Soft,  nontender, nondistended. Bowel sounds present. No organomegaly or mass.  EXTREMITIES: No cyanosis, clubbing or edema b/l.   Left BKA, multiple Right toe Amputations. NEUROLOGIC: Cranial nerves II through XII are intact. No focal Motor or sensory deficits b/l.   PSYCHIATRIC: The patient is alert and oriented x 3.  SKIN: No obvious rash, lesion, or ulcer.    Left Upper extremity fistula with good bruit and good thrill.  LABORATORY PANEL:   CBC  Recent Labs Lab 11/12/16 0529  WBC 9.5  HGB 12.8*  HCT 39.3*  PLT 156   ------------------------------------------------------------------------------------------------------------------  Chemistries   Recent Labs Lab 11/12/16 0529  NA 137  K 5.6*  CL 102  CO2 23  GLUCOSE 109*  BUN 82*  CREATININE 10.58*  CALCIUM 9.2   ------------------------------------------------------------------------------------------------------------------  Cardiac Enzymes  Recent Labs Lab 11/11/16 1558  TROPONINI 0.21*   ------------------------------------------------------------------------------------------------------------------  RADIOLOGY:  Dg Chest Port 1 View  Result Date: 11/12/2016 CLINICAL DATA:  Follow-up examination for respiratory failure, cough, shortness of breath. EXAM: PORTABLE CHEST 1 VIEW COMPARISON:  Prior radiograph from 11/10/2016. FINDINGS: Left-sided hemodialysis catheter in place, stable. Advanced cardiomegaly. Mediastinal silhouette unchanged. Aortic atherosclerosis. Vascular stent noted at the right subclavian region. Lungs normally inflated. Vascular congestion with interstitial prominence, suggesting pulmonary interstitial edema. Right pleural effusion with associated right basilar opacity, which may reflect atelectasis or infiltrate, overall similar to previous. No new focal airspace disease. No pneumothorax. Osseous structures unchanged. IMPRESSION: 1. Little interval change in appearance of right pleural effusion with  associated right lower lobe opacity, either atelectasis or infiltrate. 2. Stable cardiomegaly with underlying mild diffuse pulmonary interstitial edema. Electronically Signed  By: Jeannine Boga M.D.   On: 11/12/2016 06:28   Dg Chest Portable 1 View  Result Date: 11/10/2016 CLINICAL DATA:  Acute onset of shortness of breath.  Cough. EXAM: PORTABLE CHEST 1 VIEW COMPARISON:  11/02/2016 FINDINGS: Large bore central venous catheter in stable position. Vascular stent noted. Markedly enlarged cardiac silhouette. Mediastinal contours appear intact. Worsening of right pleural effusion and right lower thorax airspace consolidation. Mild interstitial pulmonary edema. Osseous structures are without acute abnormality. Soft tissues are grossly normal. IMPRESSION: Worsening of right pleural effusion and right lower thorax airspace consolidation. Markedly enlarged cardiac silhouette with mild interstitial pulmonary edema. Electronically Signed   By: Fidela Salisbury M.D.   On: 11/10/2016 23:51     ASSESSMENT AND PLAN:   61 year old male with past medical history of end-stage renal disease on hemodialysis, COPD, hypertension, hyperlipidemia, history of pulmonary embolism, previous history of MI, peripheral vascular disease, GERD, CHF who presented to the hospital due to shortness of breath.   1. acute respiratory failure with hypoxia-secondary to pulmonary edema, pneumonia. -The patient was on BiPAP but now has been weaned off of it. Continue O2 supplementation - cont. IV Cefepime for pneumonia. Had HD yesterday and plan for HD again tomorrow.   2. Pneumonia-suspected to be HCAP.  - MRSA PCR (-). Cont. IV Cefepime.  Cultures so far (-).   3. CHF-acute on chronic diastolic dysfunction. -Patient does not make any urine.  Had HD yesterday and plan for HD again tomorrow.   4. End-stage renal disease on hemodialysis-nephrology has been consult it. Patient to get hemodialysis again tomorrow. - Continue  further care as per nephrology.  5. Secondary hyperparathyroidism-continue PhosLo, Renvela  6. COPD-continue Dulera, Spiriva.  7. History of pulmonary embolism-continue warfarin.  8. Neuropathy-continue Lyrica  9. GERD-continue Protonix  Likely d/c tomorrow after HD.   All the records are reviewed and case discussed with Care Management/Social Worker. Management plans discussed with the patient, family and they are in agreement.  CODE STATUS: Full  DVT Prophylaxis: Ted's & SCD's  TOTAL TIME TAKING CARE OF THIS PATIENT: 30 minutes.   POSSIBLE D/C IN 1-2 DAYS, DEPENDING ON CLINICAL CONDITION.   Henreitta Leber M.D on 11/12/2016 at 2:32 PM  Between 7am to 6pm - Pager - [phone]  After 6pm go to [email or web address] - [facility]  Office  [phone]  CC: Primary care physician; Center, [place]

## 2016-11-13 LAB — PROTIME-INR
INR: 1.78
PROTHROMBIN TIME: 20.9 s — AB (ref 11.4–15.2)

## 2016-11-13 LAB — PROCALCITONIN: Procalcitonin: 2.91 ng/mL

## 2016-11-13 MED ORDER — AMOXICILLIN-POT CLAVULANATE 875-125 MG PO TABS: 1.0000 | ORAL_TABLET | Freq: Two times a day (BID) | ORAL | 0 refills | Status: AC

## 2016-11-13 MED ORDER — BENZONATATE 100 MG PO CAPS: 100.0000 mg | ORAL_CAPSULE | Freq: Three times a day (TID) | ORAL | 0 refills | Status: DC | PRN

## 2016-11-13 MED ORDER — WARFARIN SODIUM 3 MG PO TABS
3.0000 mg | ORAL_TABLET | Freq: Every day | ORAL | Status: DC
  Filled 2016-11-13 (×2): qty 1

## 2016-11-13 MED ORDER — WARFARIN - PHARMACIST DOSING INPATIENT: Freq: Every day | Status: DC

## 2016-11-13 NOTE — Progress Notes (Signed)
Blood flow rate decreased to 300 due to increased Arterial pressures greater than -142 per policy.

## 2016-11-13 NOTE — Progress Notes (Signed)
Hd start 

## 2016-11-13 NOTE — Progress Notes (Signed)
  End of hd 

## 2016-11-13 NOTE — Care Management (Signed)
Patient has been weaned to room air.  Anticipated discharge pack to group home tomorrow.  Home health orders have been placed, and [place] with [facility] updated.

## 2016-11-13 NOTE — Progress Notes (Signed)
[facility]  ROUNDING NOTE   Subjective:  Patient presents for shortness of breath, cough, wheezing Able to eat. No nausea or vomiting reported Still needing O2 supplementation  Objective:  Vital signs in last 24 hours:  Temp:  [97.6 F (36.4 C)-97.8 F (36.6 C)] 97.8 F (36.6 C) (06/20 0425) Pulse Rate:  [78-86] 78 (06/20 0425) Resp:  [17-18] 18 (06/20 0425) BP: (94-118)/(67-87) 94/67 (06/20 0425) SpO2:  [94 %-100 %] 94 % (06/20 1114) Weight:  [86.5 kg (190 lb 12.8 oz)] 86.5 kg (190 lb 12.8 oz) (06/20 0454)  Weight change: -0.054 kg (-1.9 oz) Filed Weights   11/11/16 1420 11/12/16 0500 11/13/16 0454  Weight: 86.6 kg (190 lb 14.7 oz) 85.5 kg (188 lb 6.4 oz) 86.5 kg (190 lb 12.8 oz)    Intake/Output: I/O last 3 completed shifts: In: 240 [P.O.:240] Out: 0    Intake/Output this shift:  Total I/O In: 240 [P.O.:240] Out: -   Physical Exam: General: No acute distress   Head: Normocephalic, atraumatic. Moist oral mucosal membranes  Eyes: Anicteric  Neck: Supple, trachea midline  Lungs:  b/l wheezing and mild crackles  Heart: S1S2 no rubs  Abdomen:  Soft, nontender,    Extremities: Left BKA, right transmetatarsal amputation   Neurologic: Awake, alert, following commands  Skin: No lesions  Access: Left IJ PermCath     Basic Metabolic Panel:  Recent Labs Lab 11/07/16 1204 11/10/16 2335 11/12/16 0529  NA 133* 139 137  K 6.2* 5.9* 5.6*  CL 101 104 102  CO2 17* 19* 23  GLUCOSE 80 98 109*  BUN 80* 94* 82*  CREATININE 11.54* 12.83* 10.58*  CALCIUM 9.4 9.4 9.2    Liver Function Tests: No results for input(s): AST, ALT, ALKPHOS, BILITOT, PROT, ALBUMIN in the last 168 hours. No results for input(s): LIPASE, AMYLASE in the last 168 hours. No results for input(s): AMMONIA in the last 168 hours.  CBC:  Recent Labs Lab 11/07/16 1204 11/10/16 2335 11/12/16 0529  WBC 6.3 10.4 9.5  NEUTROABS 3.1  --   --   HGB 13.8 13.6 12.8*  HCT 42.4 41.0 39.3*   MCV 87.9 87.7 88.0  PLT 165 186 156    Cardiac Enzymes:  Recent Labs Lab 11/10/16 2335 11/11/16 0504 11/11/16 1029 11/11/16 1558  TROPONINI 0.24* 0.23* 0.21* 0.21*    BNP: Invalid input(s): POCBNP  CBG:  Recent Labs Lab 11/11/16 0422 11/11/16 0751 11/11/16 1142 11/11/16 1638  GLUCAP 158* 181* 141* 152*    Microbiology: Results for orders placed or performed during the hospital encounter of 11/10/16  Blood culture (routine x 2)     Status: None (Preliminary result)   Collection Time: 11/10/16 11:35 PM  Result Value Ref Range Status   Specimen Description BLOOD LEFT FOREARM  Final   Special Requests   Final    BOTTLES DRAWN AEROBIC AND ANAEROBIC Blood Culture adequate volume   Culture NO GROWTH 2 DAYS  Final   Report Status PENDING  Incomplete  Blood culture (routine x 2)     Status: None (Preliminary result)   Collection Time: 11/11/16  1:14 AM  Result Value Ref Range Status   Specimen Description BLOOD BLOOD RIGHT FOREARM  Final   Special Requests   Final    BOTTLES DRAWN AEROBIC AND ANAEROBIC Blood Culture results may not be optimal due to an excessive volume of blood received in culture bottles   Culture NO GROWTH 2 DAYS  Final   Report Status PENDING  Incomplete  MRSA PCR Screening     Status: None   Collection Time: 11/11/16  4:57 AM  Result Value Ref Range Status   MRSA by PCR NEGATIVE NEGATIVE Final    Comment:        The GeneXpert MRSA Assay (FDA approved for NASAL specimens only), is one component of a comprehensive MRSA colonization surveillance program. It is not intended to diagnose MRSA infection nor to guide or monitor treatment for MRSA infections.     Coagulation Studies:  Recent Labs  11/10/16 2335 11/12/16 0529 11/13/16 0630  LABPROT 25.8* 23.0* 20.9*  INR 2.31 2.00 1.78    Urinalysis: No results for input(s): COLORURINE, LABSPEC, PHURINE, GLUCOSEU, HGBUR, BILIRUBINUR, KETONESUR, PROTEINUR, UROBILINOGEN, NITRITE,  LEUKOCYTESUR in the last 72 hours.  Invalid input(s): APPERANCEUR    Imaging: Dg Chest Port 1 View  Result Date: 11/12/2016 CLINICAL DATA:  Follow-up examination for respiratory failure, cough, shortness of breath. EXAM: PORTABLE CHEST 1 VIEW COMPARISON:  Prior radiograph from 11/10/2016. FINDINGS: Left-sided hemodialysis catheter in place, stable. Advanced cardiomegaly. Mediastinal silhouette unchanged. Aortic atherosclerosis. Vascular stent noted at the right subclavian region. Lungs normally inflated. Vascular congestion with interstitial prominence, suggesting pulmonary interstitial edema. Right pleural effusion with associated right basilar opacity, which may reflect atelectasis or infiltrate, overall similar to previous. No new focal airspace disease. No pneumothorax. Osseous structures unchanged. IMPRESSION: 1. Little interval change in appearance of right pleural effusion with associated right lower lobe opacity, either atelectasis or infiltrate. 2. Stable cardiomegaly with underlying mild diffuse pulmonary interstitial edema. Electronically Signed   By: Jeannine Boga M.D.   On: 11/12/2016 06:28     Medications:   . ceFEPime (MAXIPIME) IV Stopped (11/12/16 1728)  . ferric gluconate (FERRLECIT/NULECIT) IV Stopped (11/11/16 1802)   . aspirin  325 mg Oral Daily  . atorvastatin  10 mg Oral QHS  . calcium acetate  667 mg Oral TID WC  . chlorhexidine  15 mL Mouth Rinse BID  . docusate sodium  100 mg Oral BID  . doxercalciferol  2.5 mcg Intravenous Q M,W,F-HD  . feeding supplement (NEPRO CARB STEADY)  237 mL Oral Daily  . fluticasone  1 spray Each Nare Daily  . ipratropium-albuterol  3 mL Nebulization Q6H  . isosorbide mononitrate  30 mg Oral Daily  . mouth rinse  15 mL Mouth Rinse q12n4p  . midodrine  5 mg Oral BID WC  . mometasone-formoterol  2 puff Inhalation BID  . multivitamin  1 tablet Oral QHS  . pantoprazole  40 mg Oral Daily  . polyethylene glycol  17 g Oral BH-q7a   . pregabalin  75 mg Oral BID  . sevelamer carbonate  800 mg Oral Q breakfast  . tiotropium  18 mcg Inhalation Daily  . warfarin  3 mg Oral Daily  . Warfarin - Pharmacist Dosing Inpatient   Does not apply q1800   albuterol, benzonatate, guaiFENesin-codeine, nitroGLYCERIN, ondansetron **OR** ondansetron (ZOFRAN) IV, oxyCODONE  Assessment/ Plan:  61 y.o. male with ESRD on hemodialysis, hypertension, hyperlipidemia, anemia, COPD, peripheral vascular disease, left BKA, right toe amputations, Pulm Embolism 07/2016 requiring Warfarin  MWF [facility].   1.End stage renal disease with Hyperkalemia -  - Dialysis planned for later today - UF goal upto 2 L as tolerated  2. Anemia of chronic kidney disease:  We will hold Epogen at this time as most recent hemoglobin is 12.8.  3.   Shortness of breath - Acute Pulm edema vs Bronchitis vs  Pneumonia - empiric antibiotics - improved after 2500 cc removed  4. Secondary Hyperparathyroidism:  Monitor Phos Calcium acetate and Sevelamer   LOS: 2 Aquilla Voiles 6/20/20181:28 PM

## 2016-11-13 NOTE — Progress Notes (Signed)
Pre hd info 

## 2016-11-13 NOTE — Discharge Summary (Addendum)
[place] [facility] [place] NAME: [name]    MR#:  [id number]  DATE OF BIRTH:  [birth year]  DATE OF ADMISSION:  11/10/2016 ADMITTING PHYSICIAN: Harrie Foreman, MD  DATE OF DISCHARGE: 11/14/2016  PRIMARY CARE PHYSICIAN: [facility] [place]    ADMISSION DIAGNOSIS:  Shortness of breath [R06.02] Cough [R05] Pleural effusion [J90] Healthcare-associated pneumonia [J18.9] Elevated troponin [R74.8]  DISCHARGE DIAGNOSIS:  Active Problems:   Respiratory failure with hypoxia ([place])   SECONDARY DIAGNOSIS:   Past Medical History:  Diagnosis Date  . 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 Amputee, below knee, left ([place])   . Anemia   . Asthma   . Cardiomyopathy ([place])   . CHF (congestive heart failure) ([place])   . Chronic systolic heart failure ([place])   . Complication of anesthesia    hypotension  . COPD (chronic obstructive pulmonary disease) ([place])   . Coronary artery disease   . Dialysis patient ([place])    Mon, Wed, Fri  . End stage renal disease ([place])   . GERD (gastroesophageal reflux disease)   . Headache   . History of kidney stones   . History of pulmonary embolism   . HLD (hyperlipidemia)   . HTN (hypertension)   . Hyperparathyroidism   . Myocardial infarction ([place])   . Peripheral vascular disease ([place])   . Shortness of breath dyspnea   . Sleep apnea    NO C-PAP, Patient stated in process of  "getting one"   . Tobacco dependence     HOSPITAL COURSE:   61 year old male with past medical history of end-stage renal disease on hemodialysis, COPD, hypertension, hyperlipidemia, history of pulmonary embolism, previous history of MI, peripheral vascular disease, GERD, CHF who presented to the hospital due to shortness of breath.   1. acute respiratory failure with hypoxia-secondary to pulmonary edema, pneumonia. -Patient was initially on BiPAP and admitted to the intensive care unit. He was quickly  weaned off the BiPAP. -He underwent hemodialysis to help with his pulmonary edema, he was also placed on blood short-term IV antibiotics vancomycin, cefepime. He has clinically improved he continues to have a cough but no further shortness of breath or hypoxia and therefore being discharged back to his group home. -He will be discharged on some oral Augmentin empirically for about 7 days.  2. Pneumonia-suspected to be HCAP. Initially treated with IV vancomycin, cefepime. Vancomycin discontinued as MRSA PCR was negative. Maintained on cefepime has remained afebrile and hemodynamically stable with cultures remaining negative. He is being discharged on oral Augmentin.  3. CHF-acute on chronic diastolic dysfunction. -Patient does not make any urine. he was dialyzed in the hospital and his volume status is improved.  4. End-stage renal disease on hemodialysis-nephrology was consulted and patient was maintained on his Monday Wednesday Friday schedule and he will resume that upon discharge.  5. Secondary hyperparathyroidism- he will continue PhosLo, Renvela  6. COPD-he will continue his Symbicort, Combivent, DuoNeb's as needed..  7. History of pulmonary embolism- he will continue warfarin. INR Therapeutic.   8. Neuropathy- he will continue Lyrica  9. GERD- he will continue Protonix  DISCHARGE CONDITIONS:   Stable.   CONSULTS OBTAINED:  Treatment Team:  Murlean Iba, MD Isaias Cowman, MD  DRUG ALLERGIES:   Allergies  Allergen Reactions  . Dust Mite Extract Other (See Comments)    Reaction: unknown    DISCHARGE MEDICATIONS:   Allergies as of 11/13/2016  Reactions   Dust Mite Extract Other (See Comments)   Reaction: unknown      Medication List    STOP taking these medications   Vancomycin 750-5 MG/150ML-% Soln Commonly known as:  VANCOCIN     TAKE these medications   acetaminophen 500 MG tablet Commonly known as:  TYLENOL Take 1,000 mg by mouth every 6  (six) hours as needed.   amoxicillin-clavulanate 875-125 MG tablet Commonly known as:  AUGMENTIN Take 1 tablet by mouth 2 (two) times daily.   aspirin 325 MG tablet Take 325 mg by mouth every morning.   atorvastatin 10 MG tablet Commonly known as:  LIPITOR Take 10 mg by mouth at bedtime.   benzonatate 100 MG capsule Commonly known as:  TESSALON Take 1 capsule (100 mg total) by mouth 3 (three) times daily as needed for cough.   budesonide-formoterol 160-4.5 MCG/ACT inhaler Commonly known as:  SYMBICORT Inhale 2 puffs into the lungs 2 (two) times daily. Reported on 08/14/2015   calcium acetate 667 MG capsule Commonly known as:  PHOSLO Take 667 mg by mouth 3 (three) times daily with meals.   Darbepoetin Alfa 40 MCG/0.4ML Sosy injection Commonly known as:  ARANESP Inject 0.4 mLs (40 mcg total) into the vein every Monday with hemodialysis.   docusate sodium 100 MG capsule Commonly known as:  COLACE Take 100 mg by mouth 2 (two) times daily as needed for mild constipation. Reported on 08/14/2015   doxercalciferol 4 MCG/2ML injection Commonly known as:  HECTOROL Inject 1.25 mLs (2.5 mcg total) into the vein every Monday, Wednesday, and Friday with hemodialysis.   feeding supplement (NEPRO CARB STEADY) Liqd Take 237 mLs by mouth daily.   ferric gluconate 125 mg in sodium chloride 0.9 % 100 mL Inject 125 mg into the vein every Monday, Wednesday, and Friday with hemodialysis.   fluticasone 50 MCG/ACT nasal spray Commonly known as:  FLONASE Place 1 spray into both nostrils daily.   ipratropium-albuterol 0.5-2.5 (3) MG/3ML Soln Commonly known as:  DUONEB Take 3 mLs by nebulization every 4 (four) hours as needed (SOB).   COMBIVENT IN Inhale 1 puff into the lungs every 6 (six) hours as needed (for wheezing/shortness of breath). COMBIVENT AEROSOL 18-103 MCG/ACT   isosorbide mononitrate 30 MG 24 hr tablet Commonly known as:  IMDUR Take 1 tablet (30 mg total) by mouth daily.    midodrine 5 MG tablet Commonly known as:  PROAMATINE 5 mg 2 (two) times daily with a meal.   multivitamin Tabs tablet Take 1 tablet by mouth at bedtime.   nitroGLYCERIN 0.4 MG SL tablet Commonly known as:  NITROSTAT Place 0.4 mg under the tongue every 5 (five) minutes as needed for chest pain.   oxyCODONE 5 MG immediate release tablet Commonly known as:  Oxy IR/ROXICODONE Take 5 mg by mouth every 8 (eight) hours as needed for severe pain.   pantoprazole 20 MG tablet Commonly known as:  PROTONIX Take 20 mg by mouth daily.   polyethylene glycol packet Commonly known as:  MIRALAX / GLYCOLAX Take 17 g by mouth every morning. Hold for loose stools.   pregabalin 75 MG capsule Commonly known as:  LYRICA Take 75 mg by mouth 2 (two) times daily.   sevelamer carbonate 800 MG tablet Commonly known as:  RENVELA Take 800 mg by mouth daily.   tiotropium 18 MCG inhalation capsule Commonly known as:  SPIRIVA Place 18 mcg into inhaler and inhale daily.   warfarin 2.5 MG tablet Commonly known as:  COUMADIN Take 2.5 mg by mouth daily.   warfarin 3 MG tablet Commonly known as:  COUMADIN Take 3 mg by mouth daily.         DISCHARGE INSTRUCTIONS:   DIET:  Cardiac diet and Renal diet  DISCHARGE CONDITION:  Stable  ACTIVITY:  Activity as tolerated  OXYGEN:  Home Oxygen: No.   Oxygen Delivery: room air  DISCHARGE LOCATION:  group home with Home Health PT, RN.   If you experience worsening of your admission symptoms, develop shortness of breath, life threatening emergency, suicidal or homicidal thoughts you must seek medical attention immediately by calling 911 or calling your MD immediately  if symptoms less severe.  You Must read complete instructions/literature along with all the possible adverse reactions/side effects for all the Medicines you take and that have been prescribed to you. Take any new Medicines after you have completely understood and accpet all the  possible adverse reactions/side effects.   Please note  You were cared for by a hospitalist during your hospital stay. If you have any questions about your discharge medications or the care you received while you were in the hospital after you are discharged, you can call the unit and asked to speak with the hospitalist on call if the hospitalist that took care of you is not available. Once you are discharged, your primary care physician will handle any further medical issues. Please note that NO REFILLS for any discharge medications will be authorized once you are discharged, as it is imperative that you return to your primary care physician (or establish a relationship with a primary care physician if you do not have one) for your aftercare needs so that they can reassess your need for medications and monitor your lab values.     Today   Shortness of breath is improved, continues to have a cough which is nonproductive.  VITAL SIGNS:  Blood pressure 94/67, pulse 78, temperature 97.8 F (36.6 C), temperature source Oral, resp. rate 18, height 6\' 2"  (1.88 m), weight 86.5 kg (190 lb 12.8 oz), SpO2 94 %.  I/O:   Intake/Output Summary (Last 24 hours) at 11/13/16 1245 Last data filed at 11/13/16 1000  Gross per 24 hour  Intake              240 ml  Output                0 ml  Net              240 ml    PHYSICAL EXAMINATION:   GENERAL:  61 y.o.-year-old patient lying in bed in NAD  EYES: Pupils equal, round, reactive to light and accommodation. No scleral icterus. Extraocular muscles intact.  HEENT: Head atraumatic, normocephalic. Oropharynx and nasopharynx clear.  NECK:  Supple, no jugular venous distention. No thyroid enlargement, no tenderness.  LUNGS: Good A/e b/l, no wheezing, bibasilar rales, No rhonchi. No use of accessory muscles of respiration.  CARDIOVASCULAR: S1, S2 normal. No murmurs, rubs, or gallops.  ABDOMEN: Soft, nontender, nondistended. Bowel sounds present. No  organomegaly or mass.  EXTREMITIES: No cyanosis, clubbing or edema b/l.   Left BKA, multiple Right toe Amputations. NEUROLOGIC: Cranial nerves II through XII are intact. No focal Motor or sensory deficits b/l.   PSYCHIATRIC: The patient is alert and oriented x 3.  SKIN: No obvious rash, lesion, or ulcer.    Left Upper extremity fistula with good bruit and good thrill.  DATA REVIEW:   CBC  Recent  Labs Lab 11/12/16 0529  WBC 9.5  HGB 12.8*  HCT 39.3*  PLT 156    Chemistries   Recent Labs Lab 11/12/16 0529  NA 137  K 5.6*  CL 102  CO2 23  GLUCOSE 109*  BUN 82*  CREATININE 10.58*  CALCIUM 9.2    Cardiac Enzymes  Recent Labs Lab 11/11/16 1558  TROPONINI 0.21*    Microbiology Results  Results for orders placed or performed during the hospital encounter of 11/10/16  Blood culture (routine x 2)     Status: None (Preliminary result)   Collection Time: 11/10/16 11:35 PM  Result Value Ref Range Status   Specimen Description BLOOD LEFT FOREARM  Final   Special Requests   Final    BOTTLES DRAWN AEROBIC AND ANAEROBIC Blood Culture adequate volume   Culture NO GROWTH 2 DAYS  Final   Report Status PENDING  Incomplete  Blood culture (routine x 2)     Status: None (Preliminary result)   Collection Time: 11/11/16  1:14 AM  Result Value Ref Range Status   Specimen Description BLOOD BLOOD RIGHT FOREARM  Final   Special Requests   Final    BOTTLES DRAWN AEROBIC AND ANAEROBIC Blood Culture results may not be optimal due to an excessive volume of blood received in culture bottles   Culture NO GROWTH 2 DAYS  Final   Report Status PENDING  Incomplete  MRSA PCR Screening     Status: None   Collection Time: 11/11/16  4:57 AM  Result Value Ref Range Status   MRSA by PCR NEGATIVE NEGATIVE Final    Comment:        The GeneXpert MRSA Assay (FDA approved for NASAL specimens only), is one component of a comprehensive MRSA colonization surveillance program. It is not intended to  diagnose MRSA infection nor to guide or monitor treatment for MRSA infections.     RADIOLOGY:  Dg Chest Port 1 View  Result Date: 11/12/2016 CLINICAL DATA:  Follow-up examination for respiratory failure, cough, shortness of breath. EXAM: PORTABLE CHEST 1 VIEW COMPARISON:  Prior radiograph from 11/10/2016. FINDINGS: Left-sided hemodialysis catheter in place, stable. Advanced cardiomegaly. Mediastinal silhouette unchanged. Aortic atherosclerosis. Vascular stent noted at the right subclavian region. Lungs normally inflated. Vascular congestion with interstitial prominence, suggesting pulmonary interstitial edema. Right pleural effusion with associated right basilar opacity, which may reflect atelectasis or infiltrate, overall similar to previous. No new focal airspace disease. No pneumothorax. Osseous structures unchanged. IMPRESSION: 1. Little interval change in appearance of right pleural effusion with associated right lower lobe opacity, either atelectasis or infiltrate. 2. Stable cardiomegaly with underlying mild diffuse pulmonary interstitial edema. Electronically Signed   By: Jeannine Boga M.D.   On: 11/12/2016 06:28      Management plans discussed with the patient, family and they are in agreement.  CODE STATUS:     Code Status Orders        Start     Ordered   11/11/16 0422  Full code  Continuous     11/11/16 0421          TOTAL TIME TAKING CARE OF THIS PATIENT: 40 minutes.    Henreitta Leber M.D on 11/13/2016 at 12:45 PM  Between 7am to 6pm - Pager - [phone]  After 6pm go to [email or web address] - [facility]  Office  [phone]  CC: Primary care physician; [facility] [place]

## 2016-11-13 NOTE — Progress Notes (Signed)
Post hd vitals 

## 2016-11-13 NOTE — Progress Notes (Signed)
ANTICOAGULATION CONSULT NOTE - Initial Consult  Pharmacy Consult for warfarin dosing Indication: h/o recurrent PE  Allergies  Allergen Reactions  . Dust Mite Extract Other (See Comments)    Reaction: unknown    Patient Measurements: Height: 6\' 2"  (188 cm) Weight: 190 lb 12.8 oz (86.5 kg) IBW/kg (Calculated) : 82.2 Heparin Dosing Weight: n/a  Vital Signs: Temp: 97.8 F (36.6 C) (06/20 0425) Temp Source: Oral (06/20 0425) BP: 94/67 (06/20 0425) Pulse Rate: 78 (06/20 0425)  Labs:  Recent Labs  11/10/16 2335 11/11/16 0504 11/11/16 1029 11/11/16 1558 11/12/16 0529 11/13/16 0630  HGB 13.6  --   --   --  12.8*  --   HCT 41.0  --   --   --  39.3*  --   PLT 186  --   --   --  156  --   LABPROT 25.8*  --   --   --  23.0* 20.9*  INR 2.31  --   --   --  2.00 1.78  CREATININE 12.83*  --   --   --  10.58*  --   TROPONINI 0.24* 0.23* 0.21* 0.21*  --   --     Estimated Creatinine Clearance: 8.4 mL/min (A) (by C-G formula based on SCr of 10.58 mg/dL (H)).   Medical History: Past Medical History:  Diagnosis Date  . 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 Amputee, below knee, left ([place])   . Anemia   . Asthma   . Cardiomyopathy ([place])   . CHF (congestive heart failure) ([place])   . Chronic systolic heart failure ([place])   . Complication of anesthesia    hypotension  . COPD (chronic obstructive pulmonary disease) ([place])   . Coronary artery disease   . Dialysis patient ([place])    Mon, Wed, Fri  . End stage renal disease ([place])   . GERD (gastroesophageal reflux disease)   . Headache   . History of kidney stones   . History of pulmonary embolism   . HLD (hyperlipidemia)   . HTN (hypertension)   . Hyperparathyroidism   . Myocardial infarction ([place])   . Peripheral vascular disease ([place])   . Shortness of breath dyspnea   . Sleep apnea    NO C-PAP, Patient stated in process of  "getting one"   . Tobacco dependence     Medications:  Home dose 2.5 mg daily per med  rec and review of old notes.  Assessment: INR therapeutic on admission.  Goal of Therapy:  INR 2-3  Date  INR  Dose 6/17  2.31 6/18    2.5 mg (given 11/12/16 at 0219) 6/19  2  2.5 mg 6/.20  1.78  3 mg   Plan:  Increas VKA to 3 mg po daily. INR daily while on antibiotic therapy.  Laural Benes, Pharm.D., BCPS Clinical Pharmacist 11/13/2016,9:26 AM

## 2016-11-13 NOTE — Progress Notes (Signed)
Pre hd assessment  

## 2016-11-13 NOTE — Progress Notes (Signed)
Post hd assessment 

## 2016-11-13 NOTE — Progress Notes (Signed)
[facility] [place] NAME: [name]    MR#:  [id number]  DATE OF BIRTH:  1955-07-16  SUBJECTIVE:   Patient here due to shortness of breath and noted to be hypoxic secondary to pulmonary edema and also underlying pneumonia.  Shortness of breath much improved, still having a cough which is nonproductive.   REVIEW OF SYSTEMS:    Review of Systems  Constitutional: Negative for chills and fever.  HENT: Negative for congestion and tinnitus.   Eyes: Negative for blurred vision and double vision.  Respiratory: Positive for cough. Negative for shortness of breath and wheezing.   Cardiovascular: Negative for chest pain, orthopnea and PND.  Gastrointestinal: Negative for abdominal pain, diarrhea, nausea and vomiting.  Genitourinary: Negative for dysuria and hematuria.  Neurological: Negative for dizziness, sensory change and focal weakness.  All other systems reviewed and are negative.   Nutrition: Renal diet Tolerating Diet: yes Tolerating PT: Eval noted.  DRUG ALLERGIES:   Allergies  Allergen Reactions  . Dust Mite Extract Other (See Comments)    Reaction: unknown    VITALS:  Blood pressure 113/88, pulse 82, temperature 97.5 F (36.4 C), temperature source Oral, resp. rate 18, height 6\' 2"  (1.88 m), weight 86.5 kg (190 lb 12.8 oz), SpO2 99 %.  PHYSICAL EXAMINATION:   Physical Exam  GENERAL:  61 y.o.-year-old patient lying in bed in NAD  EYES: Pupils equal, round, reactive to light and accommodation. No scleral icterus. Extraocular muscles intact.  HEENT: Head atraumatic, normocephalic. Oropharynx and nasopharynx clear.  NECK:  Supple, no jugular venous distention. No thyroid enlargement, no tenderness.  LUNGS: Good A/e b/l, no wheezing, rales, No rhonchi. No use of accessory muscles of respiration.  CARDIOVASCULAR: S1, S2 normal. No murmurs, rubs, or gallops.  ABDOMEN: Soft, nontender, nondistended. Bowel sounds present. No  organomegaly or mass.  EXTREMITIES: No cyanosis, clubbing or edema b/l.   Left BKA, multiple Right toe Amputations. NEUROLOGIC: Cranial nerves II through XII are intact. No focal Motor or sensory deficits b/l.   PSYCHIATRIC: The patient is alert and oriented x 3.  SKIN: No obvious rash, lesion, or ulcer.    Left Upper extremity fistula with good bruit and good thrill.  LABORATORY PANEL:   CBC  Recent Labs Lab 11/12/16 0529  WBC 9.5  HGB 12.8*  HCT 39.3*  PLT 156   ------------------------------------------------------------------------------------------------------------------  Chemistries   Recent Labs Lab 11/12/16 0529  NA 137  K 5.6*  CL 102  CO2 23  GLUCOSE 109*  BUN 82*  CREATININE 10.58*  CALCIUM 9.2   ------------------------------------------------------------------------------------------------------------------  Cardiac Enzymes  Recent Labs Lab 11/11/16 1558  TROPONINI 0.21*   ------------------------------------------------------------------------------------------------------------------  RADIOLOGY:  Dg Chest Port 1 View  Result Date: 11/12/2016 CLINICAL DATA:  Follow-up examination for respiratory failure, cough, shortness of breath. EXAM: PORTABLE CHEST 1 VIEW COMPARISON:  Prior radiograph from 11/10/2016. FINDINGS: Left-sided hemodialysis catheter in place, stable. Advanced cardiomegaly. Mediastinal silhouette unchanged. Aortic atherosclerosis. Vascular stent noted at the right subclavian region. Lungs normally inflated. Vascular congestion with interstitial prominence, suggesting pulmonary interstitial edema. Right pleural effusion with associated right basilar opacity, which may reflect atelectasis or infiltrate, overall similar to previous. No new focal airspace disease. No pneumothorax. Osseous structures unchanged. IMPRESSION: 1. Little interval change in appearance of right pleural effusion with associated right lower lobe opacity, either  atelectasis or infiltrate. 2. Stable cardiomegaly with underlying mild diffuse pulmonary interstitial edema. Electronically Signed   By: Pincus Badder.D.  On: 11/12/2016 06:28     ASSESSMENT AND PLAN:   61 year old male with past medical history of end-stage renal disease on hemodialysis, COPD, hypertension, hyperlipidemia, history of pulmonary embolism, previous history of MI, peripheral vascular disease, GERD, CHF who presented to the hospital due to shortness of breath.  1. acute respiratory failure with hypoxia-secondary to pulmonary edema, pneumonia. -The patient was on BiPAP but now has been weaned off of it. Continue O2 supplementation - cont. IV Cefepime for pneumonia. Had HD yesterday and plan for HD again today.  2. Pneumonia-suspected to be HCAP.  - MRSA PCR (-). Cont. IV Cefepime.  Cultures so far (-) and can d/c on Oral Augmentin tomorrow.  3. CHF-acute on chronic diastolic dysfunction. -Patient does not make any urine.  Had HD yesterday and plan for HD again today.   4. End-stage renal disease on hemodialysis-nephrology has been consulted. Patient to get hemodialysis again later today. - Continue further care as per nephrology.  5. Secondary hyperparathyroidism-continue PhosLo, Renvela  6. COPD-continue Dulera, Spiriva.  7. History of pulmonary embolism-continue warfarin.  8. Neuropathy-continue Lyrica  9. GERD-continue Protonix  Likely d/c tomorrow   All the records are reviewed and case discussed with Care Management/Social Worker. Management plans discussed with the patient, family and they are in agreement.  CODE STATUS: Full  DVT Prophylaxis: Ted's & SCD's  TOTAL TIME TAKING CARE OF THIS PATIENT: 25 minutes.   POSSIBLE D/C IN 1-2 DAYS, DEPENDING ON CLINICAL CONDITION.   Henreitta Leber M.D on 11/13/2016 at 3:10 PM  Between 7am to 6pm - Pager - [phone]  After 6pm go to [email or web address] - Patent attorney  Hospitalists  Office  [phone]  CC: Primary care physician; [facility], [place]

## 2016-11-13 NOTE — NC FL2 (Signed)
[place] LEVEL OF CARE SCREENING TOOL     IDENTIFICATION  Patient Name: [name] Birthdate: May 30, 1955 Sex: male Admission Date (Current Location): 11/10/2016  [facility] and [place] Number:  Engineering geologist and Address:  [facility], [address] [place], [place] [address]      Provider Number: [id number]  Attending Physician Name and Address:  Henreitta Leber, MD  Relative Name and Phone Number:       Current Level of Care: Hospital Recommended Level of Care:  (group home) Prior Approval Number:    Date Approved/Denied:   PASRR Number:    Discharge Plan:  (group home)    Current Diagnoses: Patient Active Problem List   Diagnosis Date Noted  . Respiratory failure with hypoxia ([place]) 11/11/2016  . Acute respiratory failure ([place]) 10/28/2016  . Pulmonary embolism ([place]) 08/03/2016  . NSTEMI (non-ST elevated myocardial infarction) ([place]) 07/26/2016  . Acute respiratory failure with hypoxia ([place]) 05/24/2016  . Right lower lobe pneumonia ([place]) 05/24/2016  . Elevated troponin 05/24/2016  . Pulmonary edema 05/21/2016  . Kidney dialysis as the cause of abnormal reaction of the patient, or of later complication, without mention of misadventure at the time of the procedure (CODE) 03/05/2016  . PVD (peripheral vascular disease) ([place]) 03/05/2016  . Preop examination 05/02/2015  . Chronic systolic heart failure ([place])   . Sepsis ([place]) 01/13/2015  . Acute encephalopathy 01/13/2015  . Hyperkalemia 01/13/2015  . Gangrene of foot ([place]) 05/04/2014  . ESRD (end stage renal disease) on dialysis ([place]) 05/04/2014  . CAD (coronary artery disease) 05/04/2014  . Normocytic anemia 05/04/2014  . Hyperlipidemia 04/26/2010  . HYPERTENSION, BENIGN 04/26/2010  . CAD, NATIVE VESSEL 04/26/2010  . End stage renal disease ([place]) 04/26/2010    Orientation RESPIRATION BLADDER Height & Weight     Self, Time, Situation, Place  Normal Continent  Weight: 190 lb 12.8 oz (86.5 kg) Height:  6\' 2"  (188 cm)  BEHAVIORAL SYMPTOMS/MOOD NEUROLOGICAL BOWEL NUTRITION STATUS   (none)  (none) Continent Diet (renal carb modified)  AMBULATORY STATUS COMMUNICATION OF NEEDS Skin   Supervision Verbally Normal                       Personal Care Assistance Level of Assistance    Bathing Assistance: Independent   Dressing Assistance: Independent     Functional Limitations Info             SPECIAL CARE FACTORS FREQUENCY   (outpatient dialysis)                    Contractures Contractures Info: Not present    Additional Factors Info  Code Status Code Status Info: full              DISCHARGE MEDICATIONS:       Allergies as of 11/13/2016      Reactions   Dust Mite Extract Other (See Comments)   Reaction: unknown         Medication List    STOP taking these medications   Vancomycin 750-5 MG/150ML-% Soln Commonly known as:  VANCOCIN     TAKE these medications   acetaminophen 500 MG tablet Commonly known as:  TYLENOL Take 1,000 mg by mouth every 6 (six) hours as needed.   amoxicillin-clavulanate 875-125 MG tablet Commonly known as:  AUGMENTIN Take 1 tablet by mouth 2 (two) times daily.   aspirin 325 MG tablet Take 325 mg by  mouth every morning.   atorvastatin 10 MG tablet Commonly known as:  LIPITOR Take 10 mg by mouth at bedtime.   benzonatate 100 MG capsule Commonly known as:  TESSALON Take 1 capsule (100 mg total) by mouth 3 (three) times daily as needed for cough.   budesonide-formoterol 160-4.5 MCG/ACT inhaler Commonly known as:  SYMBICORT Inhale 2 puffs into the lungs 2 (two) times daily. Reported on 08/14/2015   calcium acetate 667 MG capsule Commonly known as:  PHOSLO Take 667 mg by mouth 3 (three) times daily with meals.   Darbepoetin Alfa 40 MCG/0.4ML Sosy injection Commonly known as:  ARANESP Inject 0.4 mLs (40 mcg total) into the vein every Monday with  hemodialysis.   docusate sodium 100 MG capsule Commonly known as:  COLACE Take 100 mg by mouth 2 (two) times daily as needed for mild constipation. Reported on 08/14/2015   doxercalciferol 4 MCG/2ML injection Commonly known as:  HECTOROL Inject 1.25 mLs (2.5 mcg total) into the vein every Monday, Wednesday, and Friday with hemodialysis.   feeding supplement (NEPRO CARB STEADY) Liqd Take 237 mLs by mouth daily.   ferric gluconate 125 mg in sodium chloride 0.9 % 100 mL Inject 125 mg into the vein every Monday, Wednesday, and Friday with hemodialysis.   fluticasone 50 MCG/ACT nasal spray Commonly known as:  FLONASE Place 1 spray into both nostrils daily.   ipratropium-albuterol 0.5-2.5 (3) MG/3ML Soln Commonly known as:  DUONEB Take 3 mLs by nebulization every 4 (four) hours as needed (SOB).   COMBIVENT IN Inhale 1 puff into the lungs every 6 (six) hours as needed (for wheezing/shortness of breath). COMBIVENT AEROSOL 18-103 MCG/ACT   isosorbide mononitrate 30 MG 24 hr tablet Commonly known as:  IMDUR Take 1 tablet (30 mg total) by mouth daily.   midodrine 5 MG tablet Commonly known as:  PROAMATINE 5 mg 2 (two) times daily with a meal.   multivitamin Tabs tablet Take 1 tablet by mouth at bedtime.   nitroGLYCERIN 0.4 MG SL tablet Commonly known as:  NITROSTAT Place 0.4 mg under the tongue every 5 (five) minutes as needed for chest pain.   oxyCODONE 5 MG immediate release tablet Commonly known as:  Oxy IR/ROXICODONE Take 5 mg by mouth every 8 (eight) hours as needed for severe pain.   pantoprazole 20 MG tablet Commonly known as:  PROTONIX Take 20 mg by mouth daily.   polyethylene glycol packet Commonly known as:  MIRALAX / GLYCOLAX Take 17 g by mouth every morning. Hold for loose stools.   pregabalin 75 MG capsule Commonly known as:  LYRICA Take 75 mg by mouth 2 (two) times daily.   sevelamer carbonate 800 MG tablet Commonly known as:  RENVELA Take  800 mg by mouth daily.   tiotropium 18 MCG inhalation capsule Commonly known as:  SPIRIVA Place 18 mcg into inhaler and inhale daily.   warfarin 2.5 MG tablet Commonly known as:  COUMADIN Take 2.5 mg by mouth daily.   warfarin 3 MG tablet Commonly known as:  COUMADIN Take 3 mg by mouth daily.           Additional Information    Shela Leff, LCSW

## 2016-11-13 NOTE — Progress Notes (Signed)
[facility]  SUBJECTIVE: Patient denies chest pain. He did feel shortness of breath early this morning due to excessive dry coughing. Resumed supplemental oxygen. Overall feeling better.    Vitals:   11/12/16 2107 11/13/16 0243 11/13/16 0425 11/13/16 0454  BP:   94/67   Pulse:   78   Resp:   18   Temp:   97.8 F (36.6 C)   TempSrc:   Oral   SpO2: 96% 98% 99%   Weight:    86.5 kg (190 lb 12.8 oz)  Height:         Intake/Output Summary (Last 24 hours) at 11/13/16 0804 Last data filed at 11/13/16 0425  Gross per 24 hour  Intake                0 ml  Output                0 ml  Net                0 ml      PHYSICAL EXAM  General: Well developed, well nourished, in no acute distress HEENT:  Normocephalic and atramatic Neck:  No JVD.  Lungs: No crackles, rales or wheezing. Barky cough induced by deep inspiration Heart: HRRR . Normal S1 and S2 without gallops or murmurs.  Abdomen: Bowel sounds are positive, abdomen soft and non-tender  Msk:  Back normal, sitting upright in bed Extremities: No edema. Left BKA, right toes amputated Neuro: Alert and oriented X 3. Psych:  Good affect, responds appropriately   LABS: Basic Metabolic Panel:  Recent Labs  11/10/16 2335 11/12/16 0529  NA 139 137  K 5.9* 5.6*  CL 104 102  CO2 19* 23  GLUCOSE 98 109*  BUN 94* 82*  CREATININE 12.83* 10.58*  CALCIUM 9.4 9.2   Liver Function Tests: No results for input(s): AST, ALT, ALKPHOS, BILITOT, PROT, ALBUMIN in the last 72 hours. No results for input(s): LIPASE, AMYLASE in the last 72 hours. CBC:  Recent Labs  11/10/16 2335 11/12/16 0529  WBC 10.4 9.5  HGB 13.6 12.8*  HCT 41.0 39.3*  MCV 87.7 88.0  PLT 186 156   Cardiac Enzymes:  Recent Labs  11/11/16 0504 11/11/16 1029 11/11/16 1558  TROPONINI 0.23* 0.21* 0.21*   BNP: Invalid input(s): POCBNP D-Dimer: No results for input(s): DDIMER in the last 72 hours. Hemoglobin A1C: No results for input(s): HGBA1C in the  last 72 hours. Fasting Lipid Panel: No results for input(s): CHOL, HDL, LDLCALC, TRIG, CHOLHDL, LDLDIRECT in the last 72 hours. Thyroid Function Tests:  Recent Labs  11/11/16 0504  TSH 0.286*   Anemia Panel: No results for input(s): VITAMINB12, FOLATE, FERRITIN, TIBC, IRON, RETICCTPCT in the last 72 hours.  Dg Chest Port 1 View  Result Date: 11/12/2016 CLINICAL DATA:  Follow-up examination for respiratory failure, cough, shortness of breath. EXAM: PORTABLE CHEST 1 VIEW COMPARISON:  Prior radiograph from 11/10/2016. FINDINGS: Left-sided hemodialysis catheter in place, stable. Advanced cardiomegaly. Mediastinal silhouette unchanged. Aortic atherosclerosis. Vascular stent noted at the right subclavian region. Lungs normally inflated. Vascular congestion with interstitial prominence, suggesting pulmonary interstitial edema. Right pleural effusion with associated right basilar opacity, which may reflect atelectasis or infiltrate, overall similar to previous. No new focal airspace disease. No pneumothorax. Osseous structures unchanged. IMPRESSION: 1. Little interval change in appearance of right pleural effusion with associated right lower lobe opacity, either atelectasis or infiltrate. 2. Stable cardiomegaly with underlying mild diffuse pulmonary interstitial edema. Electronically Signed  By: Jeannine Boga M.D.   On: 11/12/2016 06:28      Echo EF 20-25%  TELEMETRY: NSR, 86 bpm  ASSESSMENT AND PLAN:  Active Problems:   Respiratory failure with hypoxia (HCC)    1. Borderline elevated troponin, flat at 0.21, likely demand ischemia, not ACS. 2. Respiratory failure with hypoxia secondary to pulmonary edema and COPD, clinically improving 3. Acute on chronic systolic heart failure with dilated cardiomyopathy, EF 20-25% 4. ESRD  Recommendations: 1. Continue current therapy 2. No further cardiac diagnostics at this time. 3. Recommend follow-up with Dr. Nehemiah Massed as outpatient in 1 week.    Sign off for now; call for any questions.  Clabe Seal, PA-C 11/13/2016 8:04 AM

## 2016-11-14 ENCOUNTER — Encounter
Admission: EM | Disposition: A | Discharge status: Home / Self Care | Payer: Self-pay | Source: Home / Self Care | Attending: Specialist

## 2016-11-14 ENCOUNTER — Ambulatory Visit: Admission: RE | Admit: 2016-11-14 | Payer: Medicare Other | Source: Ambulatory Visit | Admitting: Vascular Surgery

## 2016-11-14 LAB — PROTIME-INR
INR: 1.58
Prothrombin Time: 19 seconds — ABNORMAL HIGH (ref 11.4–15.2)

## 2016-11-14 SURGERY — ARTERIOVENOUS (AV) FISTULA CREATION
Anesthesia: General | Laterality: Left

## 2016-11-14 MED ORDER — BUPIVACAINE-EPINEPHRINE (PF) 0.5% -1:200000 IJ SOLN
INTRAMUSCULAR | Status: AC
  Filled 2016-11-14: qty 30

## 2016-11-14 MED ORDER — PAPAVERINE HCL 30 MG/ML IJ SOLN
INTRAMUSCULAR | Status: AC
  Filled 2016-11-14: qty 2

## 2016-11-14 MED ORDER — HEPARIN SODIUM (PORCINE) 5000 UNIT/ML IJ SOLN
INTRAMUSCULAR | Status: AC
  Filled 2016-11-14: qty 1

## 2016-11-14 NOTE — Progress Notes (Signed)
[facility]  ROUNDING NOTE   Subjective:  Patient presents for shortness of breath, cough, wheezing   Still needing O2 supplementation 2 L removed at dialysis yesterday Still has cough, some wheezing.  No nausea or vomiting reported  Objective:  Vital signs in last 24 hours:  Temp:  [97.9 F (36.6 C)-98.2 F (36.8 C)] 97.9 F (36.6 C) (06/21 1255) Pulse Rate:  [74-88] 81 (06/21 1255) Resp:  [14-25] 16 (06/21 1255) BP: (105-130)/(59-102) 122/97 (06/21 1255) SpO2:  [96 %-100 %] 99 % (06/21 1255) Weight:  [86 kg (189 lb 9.6 oz)] 86 kg (189 lb 9.6 oz) (06/20 1710)  Weight change: -0.544 kg (-1 lb 3.2 oz) Filed Weights   11/12/16 0500 11/13/16 0454 11/13/16 1710  Weight: 85.5 kg (188 lb 6.4 oz) 86.5 kg (190 lb 12.8 oz) 86 kg (189 lb 9.6 oz)    Intake/Output: I/O last 3 completed shifts: In: 240 [P.O.:240] Out: 2000 [Other:2000]   Intake/Output this shift:  No intake/output data recorded.  Physical Exam: General: No acute distress   Head: Normocephalic, atraumatic. Moist oral mucosal membranes  Eyes: Anicteric  Neck: Supple, trachea midline  Lungs:  b/l wheezing and mild crackles  Heart: S1S2 no rubs  Abdomen:  Soft, nontender,    Extremities: Left BKA, right transmetatarsal amputation   Neurologic: Awake, alert, following commands  Skin: No lesions  Access: Left IJ PermCath     Basic Metabolic Panel:  Recent Labs Lab 11/10/16 2335 11/12/16 0529  NA 139 137  K 5.9* 5.6*  CL 104 102  CO2 19* 23  GLUCOSE 98 109*  BUN 94* 82*  CREATININE 12.83* 10.58*  CALCIUM 9.4 9.2    Liver Function Tests: No results for input(s): AST, ALT, ALKPHOS, BILITOT, PROT, ALBUMIN in the last 168 hours. No results for input(s): LIPASE, AMYLASE in the last 168 hours. No results for input(s): AMMONIA in the last 168 hours.  CBC:  Recent Labs Lab 11/10/16 2335 11/12/16 0529  WBC 10.4 9.5  HGB 13.6 12.8*  HCT 41.0 39.3*  MCV 87.7 88.0  PLT 186 156     Cardiac Enzymes:  Recent Labs Lab 11/10/16 2335 11/11/16 0504 11/11/16 1029 11/11/16 1558  TROPONINI 0.24* 0.23* 0.21* 0.21*    BNP: Invalid input(s): POCBNP  CBG:  Recent Labs Lab 11/11/16 0422 11/11/16 0751 11/11/16 1142 11/11/16 1638  GLUCAP 158* 181* 141* 152*    Microbiology: Results for orders placed or performed during the hospital encounter of 11/10/16  Blood culture (routine x 2)     Status: None (Preliminary result)   Collection Time: 11/10/16 11:35 PM  Result Value Ref Range Status   Specimen Description BLOOD LEFT FOREARM  Final   Special Requests   Final    BOTTLES DRAWN AEROBIC AND ANAEROBIC Blood Culture adequate volume   Culture NO GROWTH 3 DAYS  Final   Report Status PENDING  Incomplete  Blood culture (routine x 2)     Status: None (Preliminary result)   Collection Time: 11/11/16  1:14 AM  Result Value Ref Range Status   Specimen Description BLOOD BLOOD RIGHT FOREARM  Final   Special Requests   Final    BOTTLES DRAWN AEROBIC AND ANAEROBIC Blood Culture results may not be optimal due to an excessive volume of blood received in culture bottles   Culture NO GROWTH 3 DAYS  Final   Report Status PENDING  Incomplete  MRSA PCR Screening     Status: None   Collection Time: 11/11/16  4:33 AM  Result Value Ref Range Status   MRSA by PCR NEGATIVE NEGATIVE Final    Comment:        The GeneXpert MRSA Assay (FDA approved for NASAL specimens only), is one component of a comprehensive MRSA colonization surveillance program. It is not intended to diagnose MRSA infection nor to guide or monitor treatment for MRSA infections.     Coagulation Studies:  Recent Labs  11/12/16 0529 11/13/16 0630 11/14/16 0430  LABPROT 23.0* 20.9* 19.0*  INR 2.00 1.78 1.58    Urinalysis: No results for input(s): COLORURINE, LABSPEC, PHURINE, GLUCOSEU, HGBUR, BILIRUBINUR, KETONESUR, PROTEINUR, UROBILINOGEN, NITRITE, LEUKOCYTESUR in the last 72 hours.  Invalid  input(s): APPERANCEUR    Imaging: No results found.   Medications:   . ceFEPime (MAXIPIME) IV Stopped (11/12/16 1728)   . aspirin  325 mg Oral Daily  . atorvastatin  10 mg Oral QHS  . calcium acetate  667 mg Oral TID WC  . chlorhexidine  15 mL Mouth Rinse BID  . docusate sodium  100 mg Oral BID  . doxercalciferol  2.5 mcg Intravenous Q M,W,F-HD  . feeding supplement (NEPRO CARB STEADY)  237 mL Oral Daily  . fluticasone  1 spray Each Nare Daily  . ipratropium-albuterol  3 mL Nebulization Q6H  . isosorbide mononitrate  30 mg Oral Daily  . mouth rinse  15 mL Mouth Rinse q12n4p  . midodrine  5 mg Oral BID WC  . mometasone-formoterol  2 puff Inhalation BID  . multivitamin  1 tablet Oral QHS  . pantoprazole  40 mg Oral Daily  . polyethylene glycol  17 g Oral BH-q7a  . pregabalin  75 mg Oral BID  . sevelamer carbonate  800 mg Oral Q breakfast  . tiotropium  18 mcg Inhalation Daily  . warfarin  3 mg Oral Daily  . Warfarin - Pharmacist Dosing Inpatient   Does not apply q1800   albuterol, benzonatate, guaiFENesin-codeine, nitroGLYCERIN, ondansetron **OR** ondansetron (ZOFRAN) IV, oxyCODONE  Assessment/ Plan:  61 y.o. male with ESRD on hemodialysis, hypertension, hyperlipidemia, anemia, COPD, peripheral vascular disease, left BKA, right toe amputations, Pulm Embolism 07/2016 requiring Warfarin  MWF [facility]ph[facility].   1.End stage renal disease with Hyperkalemia -  - May continue dialysis as outpatient  2. Anemia of chronic kidney disease:  We will hold Epogen at this time as most recent hemoglobin is 12.8.  3.   Shortness of breath - Acute Pulm edema vs Bronchitis vs Pneumonia - empiric antibiotics - improved after fluid was removed  4. Secondary Hyperparathyroidism:  Monitor Phos Calcium acetate and Sevelamer w/ meals   LOS: 3 Chastidy Ranker 6/21/20182:27 PM

## 2016-11-14 NOTE — Clinical Social Work Note (Signed)
Patient able to discharge today and there have been no changes in medications or in discharge summary. Shela Leff MSW,LcSW [phone]

## 2016-11-14 NOTE — Progress Notes (Signed)
Patient discharged to group home. Patient sister picked patient up for transfer. Report called to group home. Prescription and discharge summary provided to patient.

## 2016-11-14 NOTE — Progress Notes (Signed)
ANTICOAGULATION CONSULT NOTE - Initial Consult  Pharmacy Consult for warfarin dosing Indication: h/o recurrent PE  Allergies  Allergen Reactions  . Dust Mite Extract Other (See Comments)    Reaction: unknown    Patient Measurements: Height: 6\' 2"  (188 cm) Weight: 189 lb 9.6 oz (86 kg) IBW/kg (Calculated) : 82.2 Heparin Dosing Weight: n/a  Vital Signs: Temp: 98.2 F (36.8 C) (06/21 0418) Temp Source: Oral (06/21 0418) BP: 105/59 (06/21 0418) Pulse Rate: 87 (06/21 0418)  Labs:  Recent Labs  11/11/16 1029 11/11/16 1558 11/12/16 0529 11/13/16 0630 11/14/16 0430  HGB  --   --  12.8*  --   --   HCT  --   --  39.3*  --   --   PLT  --   --  156  --   --   LABPROT  --   --  23.0* 20.9* 19.0*  INR  --   --  2.00 1.78 1.58  CREATININE  --   --  10.58*  --   --   TROPONINI 0.21* 0.21*  --   --   --     Estimated Creatinine Clearance: 8.4 mL/min (A) (by C-G formula based on SCr of 10.58 mg/dL (H)).   Medical History: Past Medical History:  Diagnosis Date  . 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 Amputee, below knee, left ([place])   . Anemia   . Asthma   . Cardiomyopathy ([place])   . CHF (congestive heart failure) ([place])   . Chronic systolic heart failure ([place])   . Complication of anesthesia    hypotension  . COPD (chronic obstructive pulmonary disease) ([place])   . Coronary artery disease   . Dialysis patient ([place])    Mon, Wed, Fri  . End stage renal disease ([place])   . GERD (gastroesophageal reflux disease)   . Headache   . History of kidney stones   . History of pulmonary embolism   . HLD (hyperlipidemia)   . HTN (hypertension)   . Hyperparathyroidism   . Myocardial infarction ([place])   . Peripheral vascular disease ([place])   . Shortness of breath dyspnea   . Sleep apnea    NO C-PAP, Patient stated in process of  "getting one"   . Tobacco dependence     Medications:  Home dose 2.5 mg daily per med rec and review of old notes.  Assessment: INR  therapeutic on admission.  Goal of Therapy:  INR 2-3  Date  INR  Dose 6/17  2.31 6/18    2.5 mg (given 11/12/16 at 0219) 6/19  2  2.5 mg 6/20  1.78  0 mg (dose not given - "patient not available") 6/21  1.58  3  mg   Plan:  VKA dose not given last night - RN charted "patient not available." Will continue scheduled 3 mg po daily dose and hope patient received VKA this evening.   Laural Benes, Pharm.D., BCPS Clinical Pharmacist 11/14/2016,8:10 AM

## 2016-11-14 NOTE — OR Nursing (Signed)
Floor staff not aware pt sched for surgery and pt had brkfst @ 0830.  Dr. Lucky Cowboy was not aware pt had been admitted.  Per Dr. Kayleen Memos, pt must be 8 hrs npo, Dr. Lucky Cowboy advises will need to reschedule.  OR aware.

## 2016-11-14 NOTE — Care Management (Signed)
[facility] notified of patient discharging today. RNCM signing off

## 2016-11-16 LAB — CULTURE, BLOOD (ROUTINE X 2)
CULTURE: NO GROWTH
Culture: NO GROWTH
SPECIAL REQUESTS: ADEQUATE

## 2016-11-18 ENCOUNTER — Encounter (INDEPENDENT_AMBULATORY_CARE_PROVIDER_SITE_OTHER): Payer: Self-pay

## 2016-11-25 ENCOUNTER — Other Ambulatory Visit (INDEPENDENT_AMBULATORY_CARE_PROVIDER_SITE_OTHER): Payer: Self-pay | Admitting: Vascular Surgery

## 2016-11-25 NOTE — Patient Instructions (Signed)
  Your procedure is scheduled on: 11/28/16 Report to Day Surgery. [facility]OND FLOOR To find out your arrival time please call [phone] between 1PM - 3PM on7/3/18 Remember: Instructions that are not followed completely may result in serious medical risk, up to and including death, or upon the discretion of your surgeon and anesthesiologist your surgery may need to be rescheduled.    X____ 1. Do not eat food or drink liquids after midnight. No gum chewing or hard candies.     ____ 2. No Alcohol for 24 hours before or after surgery.   ____ 3. Do Not Smoke For 24 Hours Prior to Your Surgery.   ____ 4. Bring all medications with you on the day of surgery if instructed.    _X___ 5. Notify your doctor if there is any change in your medical condition     (cold, fever, infections).       Do not wear jewelry, make-up, hairpins, clips or nail polish.  Do not wear lotions, powders, or perfumes. You may wear deodorant.  Do not shave 48 hours prior to surgery. Men may shave face and neck.  Do not bring valuables to the hospital.    [facility] is not responsible for any belongings or valuables.               Contacts, dentures or bridgework may not be worn into surgery.  Leave your suitcase in the car. After surgery it may be brought to your room.  For patients admitted to the hospital, discharge time is determined by your                treatment team.   Patients discharged the day of surgery will not be allowed to drive home.     __X__ Take these medicines the morning of surgery with A SIP OF WATER:    1. ISOSORBIDE  2. MIDODRINE  3. PROTONIX  4. PREGABLIN  5.  6.  ____ Fleet Enema (as directed)   ____ Use CHG Soap as directed  _X___ Use inhalers on the day of surgery    DO NEB TX BEFORE COMING DAY OF SURGERY AND BRING INHALERS  ____ Stop metformin 2 days prior to surgery    ____ Take 1/2 of usual insulin dose the night before surgery and none on the morning of  surgery.   _X___ Stop Coumadin/Plavix/aspirin on   STOP COUMADIN UNTIL AFTER SURGERY 11/28/16  ____ Stop Anti-inflammatories on    ____ Stop supplements until after surgery.    ____ Bring C-Pap to the hospital.

## 2016-11-25 NOTE — Pre-Procedure Instructions (Signed)
CALLED LAURA AT [name] TO GET PREOP ORDERS FOR 11/28/16 SURGERY. STELLA CALLED AND GIVEN PREOP INSTRUCTIONS AT [place] [phone]. ALSO SPOKEE TO PATIENTS WHO DENIED ANY NEW PROBLEMS

## 2016-11-27 MED ORDER — CEFAZOLIN SODIUM-DEXTROSE 2-4 GM/100ML-% IV SOLN
2.0000 g | INTRAVENOUS | Status: AC
  Administered 2016-11-28: 2 g via INTRAVENOUS

## 2016-11-28 ENCOUNTER — Ambulatory Visit
Admission: RE | Admit: 2016-11-28 | Discharge: 2016-11-28 | Disposition: A | Discharge status: Home / Self Care | Payer: Medicare Other | Source: Ambulatory Visit | Attending: Vascular Surgery | Admitting: Vascular Surgery

## 2016-11-28 ENCOUNTER — Encounter: Payer: Self-pay | Admitting: Anesthesiology

## 2016-11-28 ENCOUNTER — Encounter
Admission: RE | Disposition: A | Discharge status: Home / Self Care | Payer: Self-pay | Source: Ambulatory Visit | Attending: Vascular Surgery

## 2016-11-28 ENCOUNTER — Ambulatory Visit: Payer: Medicare Other | Admitting: Anesthesiology

## 2016-11-28 DIAGNOSIS — E213 Hyperparathyroidism, unspecified: Secondary | ICD-10-CM | POA: Insufficient documentation

## 2016-11-28 DIAGNOSIS — I252 Old myocardial infarction: Secondary | ICD-10-CM | POA: Diagnosis not present

## 2016-11-28 DIAGNOSIS — Z7982 Long term (current) use of aspirin: Secondary | ICD-10-CM | POA: Insufficient documentation

## 2016-11-28 DIAGNOSIS — Z8249 Family history of ischemic heart disease and other diseases of the circulatory system: Secondary | ICD-10-CM | POA: Insufficient documentation

## 2016-11-28 DIAGNOSIS — Z79899 Other long term (current) drug therapy: Secondary | ICD-10-CM | POA: Insufficient documentation

## 2016-11-28 DIAGNOSIS — Z806 Family history of leukemia: Secondary | ICD-10-CM | POA: Insufficient documentation

## 2016-11-28 DIAGNOSIS — Z992 Dependence on renal dialysis: Secondary | ICD-10-CM | POA: Insufficient documentation

## 2016-11-28 DIAGNOSIS — N186 End stage renal disease: Secondary | ICD-10-CM | POA: Diagnosis not present

## 2016-11-28 DIAGNOSIS — Z7901 Long term (current) use of anticoagulants: Secondary | ICD-10-CM | POA: Diagnosis not present

## 2016-11-28 DIAGNOSIS — I5022 Chronic systolic (congestive) heart failure: Secondary | ICD-10-CM | POA: Diagnosis not present

## 2016-11-28 DIAGNOSIS — K219 Gastro-esophageal reflux disease without esophagitis: Secondary | ICD-10-CM | POA: Insufficient documentation

## 2016-11-28 DIAGNOSIS — Z91048 Other nonmedicinal substance allergy status: Secondary | ICD-10-CM | POA: Insufficient documentation

## 2016-11-28 DIAGNOSIS — Z86711 Personal history of pulmonary embolism: Secondary | ICD-10-CM | POA: Insufficient documentation

## 2016-11-28 DIAGNOSIS — Z7951 Long term (current) use of inhaled steroids: Secondary | ICD-10-CM | POA: Diagnosis not present

## 2016-11-28 DIAGNOSIS — Z9861 Coronary angioplasty status: Secondary | ICD-10-CM | POA: Diagnosis not present

## 2016-11-28 DIAGNOSIS — I700 Atherosclerosis of aorta: Secondary | ICD-10-CM | POA: Diagnosis not present

## 2016-11-28 DIAGNOSIS — J449 Chronic obstructive pulmonary disease, unspecified: Secondary | ICD-10-CM | POA: Insufficient documentation

## 2016-11-28 DIAGNOSIS — E785 Hyperlipidemia, unspecified: Secondary | ICD-10-CM | POA: Insufficient documentation

## 2016-11-28 DIAGNOSIS — F17210 Nicotine dependence, cigarettes, uncomplicated: Secondary | ICD-10-CM | POA: Diagnosis not present

## 2016-11-28 DIAGNOSIS — Z89419 Acquired absence of unspecified great toe: Secondary | ICD-10-CM | POA: Insufficient documentation

## 2016-11-28 DIAGNOSIS — Z89512 Acquired absence of left leg below knee: Secondary | ICD-10-CM | POA: Diagnosis not present

## 2016-11-28 DIAGNOSIS — G4730 Sleep apnea, unspecified: Secondary | ICD-10-CM | POA: Insufficient documentation

## 2016-11-28 DIAGNOSIS — I739 Peripheral vascular disease, unspecified: Secondary | ICD-10-CM | POA: Diagnosis not present

## 2016-11-28 DIAGNOSIS — I7090 Unspecified atherosclerosis: Secondary | ICD-10-CM | POA: Diagnosis not present

## 2016-11-28 DIAGNOSIS — I2510 Atherosclerotic heart disease of native coronary artery without angina pectoris: Secondary | ICD-10-CM | POA: Diagnosis not present

## 2016-11-28 DIAGNOSIS — Z87442 Personal history of urinary calculi: Secondary | ICD-10-CM | POA: Diagnosis not present

## 2016-11-28 DIAGNOSIS — Z833 Family history of diabetes mellitus: Secondary | ICD-10-CM | POA: Insufficient documentation

## 2016-11-28 DIAGNOSIS — I429 Cardiomyopathy, unspecified: Secondary | ICD-10-CM | POA: Diagnosis not present

## 2016-11-28 DIAGNOSIS — I132 Hypertensive heart and chronic kidney disease with heart failure and with stage 5 chronic kidney disease, or end stage renal disease: Secondary | ICD-10-CM | POA: Diagnosis present

## 2016-11-28 DIAGNOSIS — I10 Essential (primary) hypertension: Secondary | ICD-10-CM | POA: Diagnosis not present

## 2016-11-28 HISTORY — PX: AV FISTULA PLACEMENT: SHX1204

## 2016-11-28 LAB — BASIC METABOLIC PANEL
Anion gap: 15 (ref 5–15)
BUN: 73 mg/dL — AB (ref 6–20)
CALCIUM: 9.6 mg/dL (ref 8.9–10.3)
CHLORIDE: 100 mmol/L — AB (ref 101–111)
CO2: 21 mmol/L — ABNORMAL LOW (ref 22–32)
CREATININE: 10.25 mg/dL — AB (ref 0.61–1.24)
GFR, EST AFRICAN AMERICAN: 5 mL/min — AB (ref >60)
GFR, EST NON AFRICAN AMERICAN: 5 mL/min — AB (ref >60)
Glucose, Bld: 77 mg/dL (ref 65–99)
Potassium: 6.2 mmol/L — ABNORMAL HIGH (ref 3.5–5.1)
SODIUM: 136 mmol/L (ref 135–145)

## 2016-11-28 LAB — CBC WITH DIFFERENTIAL/PLATELET
BASOS ABS: 0.1 10*3/uL (ref 0–0.1)
BASOS PCT: 1 %
EOS ABS: 0.3 10*3/uL (ref 0–0.7)
EOS PCT: 4 %
HCT: 47.4 % (ref 40.0–52.0)
HEMOGLOBIN: 15.2 g/dL (ref 13.0–18.0)
Lymphocytes Relative: 21 %
Lymphs Abs: 1.5 10*3/uL (ref 1.0–3.6)
MCH: 28.7 pg (ref 26.0–34.0)
MCHC: 32 g/dL (ref 32.0–36.0)
MCV: 89.7 fL (ref 80.0–100.0)
Monocytes Absolute: 1 10*3/uL (ref 0.2–1.0)
Monocytes Relative: 14 %
NEUTROS PCT: 60 %
Neutro Abs: 4.2 10*3/uL (ref 1.4–6.5)
PLATELETS: 148 10*3/uL — AB (ref 150–440)
RBC: 5.29 MIL/uL (ref 4.40–5.90)
RDW: 16.4 % — ABNORMAL HIGH (ref 11.5–14.5)
WBC: 7 10*3/uL (ref 3.8–10.6)

## 2016-11-28 LAB — TYPE AND SCREEN
ABO/RH(D): B POS
ANTIBODY SCREEN: NEGATIVE

## 2016-11-28 LAB — PROTIME-INR
INR: 1.28
PROTHROMBIN TIME: 16.1 s — AB (ref 11.4–15.2)

## 2016-11-28 LAB — POCT I-STAT 4, (NA,K, GLUC, HGB,HCT)
GLUCOSE: 62 mg/dL — AB (ref 65–99)
HCT: 43 % (ref 39.0–52.0)
Hemoglobin: 14.6 g/dL (ref 13.0–17.0)
Potassium: 4.9 mmol/L (ref 3.5–5.1)
Sodium: 139 mmol/L (ref 135–145)

## 2016-11-28 LAB — APTT: APTT: 34 s (ref 24–36)

## 2016-11-28 SURGERY — ARTERIOVENOUS (AV) FISTULA CREATION
Anesthesia: General | Laterality: Left

## 2016-11-28 MED ORDER — OXYCODONE-ACETAMINOPHEN 5-325 MG PO TABS: 1.0000 | ORAL_TABLET | Freq: Four times a day (QID) | ORAL | 0 refills | Status: DC | PRN

## 2016-11-28 MED ORDER — FENTANYL CITRATE (PF) 100 MCG/2ML IJ SOLN
INTRAMUSCULAR | Status: AC
  Filled 2016-11-28: qty 2

## 2016-11-28 MED ORDER — CHLORHEXIDINE GLUCONATE CLOTH 2 % EX PADS: 6.0000 | MEDICATED_PAD | Freq: Once | CUTANEOUS | Status: DC

## 2016-11-28 MED ORDER — PHENYLEPHRINE HCL 10 MG/ML IJ SOLN
INTRAMUSCULAR | Status: DC | PRN
  Administered 2016-11-28: 40 ug/min via INTRAVENOUS

## 2016-11-28 MED ORDER — PROPOFOL 10 MG/ML IV BOLUS
INTRAVENOUS | Status: AC
  Filled 2016-11-28: qty 20

## 2016-11-28 MED ORDER — ONDANSETRON HCL 4 MG/2ML IJ SOLN
INTRAMUSCULAR | Status: AC
  Filled 2016-11-28: qty 2

## 2016-11-28 MED ORDER — ACETAMINOPHEN 325 MG PO TABS
975.0000 mg | ORAL_TABLET | Freq: Once | ORAL | Status: AC
  Administered 2016-11-28: 975 mg via ORAL

## 2016-11-28 MED ORDER — EPINEPHRINE PF 1 MG/10ML IJ SOSY
PREFILLED_SYRINGE | INTRAMUSCULAR | Status: DC | PRN
  Administered 2016-11-28: 20 ug via INTRAVENOUS

## 2016-11-28 MED ORDER — PHENYLEPHRINE HCL 10 MG/ML IJ SOLN
INTRAMUSCULAR | Status: DC | PRN
  Administered 2016-11-28 (×4): 100 ug via INTRAVENOUS

## 2016-11-28 MED ORDER — PROPOFOL 10 MG/ML IV BOLUS
INTRAVENOUS | Status: DC | PRN
  Administered 2016-11-28: 30 mg via INTRAVENOUS
  Administered 2016-11-28: 80 mg via INTRAVENOUS

## 2016-11-28 MED ORDER — LIDOCAINE HCL (PF) 2 % IJ SOLN
INTRAMUSCULAR | Status: AC
  Filled 2016-11-28: qty 2

## 2016-11-28 MED ORDER — HEPARIN SODIUM (PORCINE) 5000 UNIT/ML IJ SOLN
INTRAMUSCULAR | Status: AC
  Filled 2016-11-28: qty 1

## 2016-11-28 MED ORDER — FENTANYL CITRATE (PF) 100 MCG/2ML IJ SOLN
25.0000 ug | INTRAMUSCULAR | Status: DC | PRN
  Administered 2016-11-28 (×2): 25 ug via INTRAVENOUS

## 2016-11-28 MED ORDER — LIDOCAINE HCL (CARDIAC) 20 MG/ML IV SOLN
INTRAVENOUS | Status: DC | PRN
  Administered 2016-11-28: 80 mg via INTRAVENOUS

## 2016-11-28 MED ORDER — FENTANYL CITRATE (PF) 100 MCG/2ML IJ SOLN
INTRAMUSCULAR | Status: DC | PRN
  Administered 2016-11-28: 50 ug via INTRAVENOUS

## 2016-11-28 MED ORDER — ONDANSETRON HCL 4 MG/2ML IJ SOLN: 4.0000 mg | Freq: Once | INTRAMUSCULAR | Status: DC | PRN

## 2016-11-28 MED ORDER — ONDANSETRON HCL 4 MG/2ML IJ SOLN
INTRAMUSCULAR | Status: DC | PRN
  Administered 2016-11-28: 4 mg via INTRAVENOUS

## 2016-11-28 MED ORDER — SODIUM CHLORIDE 0.9 % IV SOLN
INTRAVENOUS | Status: DC
  Administered 2016-11-28: 12:00:00 via INTRAVENOUS

## 2016-11-28 MED ORDER — EVICEL 2 ML EX KIT
PACK | CUTANEOUS | Status: AC
  Filled 2016-11-28: qty 1

## 2016-11-28 MED ORDER — CEFAZOLIN SODIUM-DEXTROSE 2-4 GM/100ML-% IV SOLN
INTRAVENOUS | Status: AC
  Filled 2016-11-28: qty 100

## 2016-11-28 MED ORDER — BUPIVACAINE-EPINEPHRINE (PF) 0.5% -1:200000 IJ SOLN
INTRAMUSCULAR | Status: AC
  Filled 2016-11-28: qty 30

## 2016-11-28 MED ORDER — EPHEDRINE SULFATE 50 MG/ML IJ SOLN
INTRAMUSCULAR | Status: DC | PRN
  Administered 2016-11-28: 5 mg via INTRAVENOUS

## 2016-11-28 MED ORDER — HEPARIN SODIUM (PORCINE) 1000 UNIT/ML IJ SOLN
INTRAMUSCULAR | Status: DC | PRN
  Administered 2016-11-28: 3000 [IU] via INTRAVENOUS

## 2016-11-28 MED ORDER — ACETAMINOPHEN 325 MG PO TABS
ORAL_TABLET | ORAL | Status: AC
  Administered 2016-11-28: 975 mg via ORAL
  Filled 2016-11-28: qty 3

## 2016-11-28 MED ORDER — FENTANYL CITRATE (PF) 100 MCG/2ML IJ SOLN
INTRAMUSCULAR | Status: AC
  Administered 2016-11-28: 25 ug via INTRAVENOUS
  Filled 2016-11-28: qty 2

## 2016-11-28 SURGICAL SUPPLY — 51 items
BAG DECANTER FOR FLEXI CONT (MISCELLANEOUS) ×3 IMPLANT
BLADE SURG SZ11 CARB STEEL (BLADE) ×3 IMPLANT
BOOT SUTURE AID YELLOW STND (SUTURE) ×3 IMPLANT
BRUSH SCRUB 4% CHG (MISCELLANEOUS) ×3 IMPLANT
CANISTER SUCT 1200ML W/VALVE (MISCELLANEOUS) ×3 IMPLANT
CHLORAPREP W/TINT 26ML (MISCELLANEOUS) ×3 IMPLANT
CLIP SPRNG 6MM S-JAW DBL (CLIP) ×3
DERMABOND ADVANCED (GAUZE/BANDAGES/DRESSINGS) ×2
DERMABOND ADVANCED .7 DNX12 (GAUZE/BANDAGES/DRESSINGS) ×1 IMPLANT
ELECT CAUTERY BLADE 6.4 (BLADE) ×3 IMPLANT
ELECT REM PT RETURN 9FT ADLT (ELECTROSURGICAL) ×3
ELECTRODE REM PT RTRN 9FT ADLT (ELECTROSURGICAL) ×1 IMPLANT
EVICEL 2ML SEALANT HUMAN (Miscellaneous) ×3 IMPLANT
GEL ULTRASOUND 20GR AQUASONIC (MISCELLANEOUS) IMPLANT
GLOVE BIO SURGEON STRL SZ7 (GLOVE) ×6 IMPLANT
GLOVE INDICATOR 7.5 STRL GRN (GLOVE) ×3 IMPLANT
GOWN STRL REUS W/ TWL LRG LVL3 (GOWN DISPOSABLE) ×2 IMPLANT
GOWN STRL REUS W/ TWL XL LVL3 (GOWN DISPOSABLE) ×1 IMPLANT
GOWN STRL REUS W/TWL LRG LVL3 (GOWN DISPOSABLE) ×4
GOWN STRL REUS W/TWL XL LVL3 (GOWN DISPOSABLE) ×2
HEMOSTAT SURGICEL 2X3 (HEMOSTASIS) IMPLANT
IV NS 500ML (IV SOLUTION) ×2
IV NS 500ML BAXH (IV SOLUTION) ×1 IMPLANT
KIT RM TURNOVER STRD PROC AR (KITS) ×3 IMPLANT
LABEL OR SOLS (LABEL) IMPLANT
LOOP RED MAXI  1X406MM (MISCELLANEOUS) ×2
LOOP VESSEL MAXI 1X406 RED (MISCELLANEOUS) ×1 IMPLANT
LOOP VESSEL MINI 0.8X406 BLUE (MISCELLANEOUS) ×1 IMPLANT
LOOPS BLUE MINI 0.8X406MM (MISCELLANEOUS) ×2
NEEDLE FILTER BLUNT 18X 1/2SAF (NEEDLE) ×2
NEEDLE FILTER BLUNT 18X1 1/2 (NEEDLE) ×1 IMPLANT
NEEDLE HYPO 30X.5 LL (NEEDLE) IMPLANT
NS IRRIG 500ML POUR BTL (IV SOLUTION) ×3 IMPLANT
PACK EXTREMITY ARMC (MISCELLANEOUS) ×3 IMPLANT
PAD PREP 24X41 OB/GYN DISP (PERSONAL CARE ITEMS) ×3 IMPLANT
SOLUTION CELL SAVER (CLIP) ×1 IMPLANT
STOCKINETTE STRL 4IN 9604848 (GAUZE/BANDAGES/DRESSINGS) ×3 IMPLANT
SUT MNCRL AB 4-0 PS2 18 (SUTURE) ×3 IMPLANT
SUT PROLENE 6 0 BV (SUTURE) ×12 IMPLANT
SUT SILK 2 0 (SUTURE) ×2
SUT SILK 2-0 18XBRD TIE 12 (SUTURE) ×1 IMPLANT
SUT SILK 3 0 (SUTURE) ×2
SUT SILK 3-0 18XBRD TIE 12 (SUTURE) ×1 IMPLANT
SUT SILK 4 0 (SUTURE) ×2
SUT SILK 4-0 18XBRD TIE 12 (SUTURE) ×1 IMPLANT
SUT VIC AB 3-0 SH 27 (SUTURE) ×4
SUT VIC AB 3-0 SH 27X BRD (SUTURE) ×2 IMPLANT
SYR 20CC LL (SYRINGE) ×3 IMPLANT
SYR 3ML LL SCALE MARK (SYRINGE) ×3 IMPLANT
SYR TB 1ML 27GX1/2 LL (SYRINGE) IMPLANT
TOWEL OR 17X26 4PK STRL BLUE (TOWEL DISPOSABLE) IMPLANT

## 2016-11-28 NOTE — Anesthesia Post-op Follow-up Note (Cosign Needed)
Anesthesia QCDR form completed.        

## 2016-11-28 NOTE — Transfer of Care (Signed)
Immediate Anesthesia Transfer of Care Note  Patient: [name]  Procedure(s) Performed: Procedure(s): ARTERIOVENOUS (AV) FISTULA CREATION (Left)  Patient Location: PACU  Anesthesia Type:General  Level of Consciousness: awake, alert  and oriented  Airway & Oxygen Therapy: Patient Spontanous Breathing and Patient connected to face mask oxygen  Post-op Assessment: Report given to RN and Post -op Vital signs reviewed and stable  Post vital signs: Reviewed and stable  Last Vitals:  Vitals:   11/28/16 1149 11/28/16 1441  BP: 109/82 104/82  Pulse: 98 78  Resp: 16 14  Temp: 36.4 C 36.4 C    Last Pain:  Vitals:   11/28/16 1441  TempSrc: Temporal         Complications: No apparent anesthesia complications

## 2016-11-28 NOTE — Discharge Instructions (Signed)
AMBULATORY SURGERY  DISCHARGE INSTRUCTIONS   1) The drugs that you were given will stay in your system until tomorrow so for the next 24 hours you should not:  A) Drive an automobile B) Make any legal decisions C) Drink any alcoholic beverage   2) You may resume regular meals tomorrow.  Today it is better to start with liquids and gradually work up to solid foods.  You may eat anything you prefer, but it is better to start with liquids, then soup and crackers, and gradually work up to solid foods.   3) Please notify your doctor immediately if you have any unusual bleeding, trouble breathing, redness and pain at the surgery site, drainage, fever, or pain not relieved by medication.    Additional Instructions:  Feel for "thrill" in left arm. It is faint right now but will hopefully get stronger as area heals.  Pulse can also be felt under incision area. You may shower and pat incision dry. Do not peal up the super glue. It will begin to loosen as your incision heals.   If you are taking the pain medication on a regular basis, please begin stool softeners at same time.     Please contact your physician with any problems or Same Day Surgery at [phone], Monday through Friday 6 am to 4 pm, or [place] at [facility] number at [phone].

## 2016-11-28 NOTE — Op Note (Signed)
 VEIN AND VASCULAR SURGERY   OPERATIVE NOTE   PROCEDURE: Left brachiocephalic arteriovenous fistula placement  PRE-OPERATIVE DIAGNOSIS: 1.  ESRD      2. Failed right arm access/fistula  POST-OPERATIVE DIAGNOSIS: 1. ESRD     2. Failed right arm access/fistula  SURGEON: Leotis Pain, MD  ASSISTANT(S): Hezzie Bump, PA-C  ANESTHESIA: general  ESTIMATED BLOOD LOSS: 15 cc  FINDING(S): Adequate cephalic vein for fistula creation  SPECIMEN(S):  none  INDICATIONS:   [name] is a 61 y.o. male who presents with renal failure in need of pemanent dialysis acces.  The patient is scheduled for left arm AVF placement.  The patient is aware the risks include but are not limited to: bleeding, infection, steal syndrome, nerve damage, ischemic monomelic neuropathy, failure to mature, and need for additional procedures.  The patient is aware of the risks of the procedure and elects to proceed forward.  DESCRIPTION: After full informed written consent was obtained from the patient, the patient was brought back to the operating room and placed supine upon the operating table.  Prior to induction, the patient received IV antibiotics.   After obtaining adequate anesthesia, the patient was then prepped and draped in the standard fashion for a left arm access procedure.  I made a curvilinear incision at the level of the antecubital fossa and dissected through the subcutaneous tissue and fascia to gain exposure of the brachial artery.  This was noted to be patent but moderately calcified and adequate in size for fistula creation.  This was dissected out proximally and distally and prepared for control with vessel loops .  I then dissected out the cephalic vein.  This was noted to be patent and adequate in size for fistula creation.  I then gave the patient 3000 units of intravenous heparin.  The vein was marked for orientation and the distal segment of the vein was ligated with a  2-0 silk, and  the vein was transected.  I then instilled the heparinized saline into the vein and clamped it.  At this point, I reset my exposure of the brachial artery and pulled up control on the vessel loops.  I made an arteriotomy with a #11 blade, and then I extended the arteriotomy with a Potts scissor.  I injected heparinized saline proximal and distal to this arteriotomy.  The vein was then sewn to the artery in an end-to-side configuration with a running stitch of 6-0 Prolene.  Prior to completing this anastomosis, I allowed the vein and artery to backbleed.  There was no evidence of clot from any vessels.  I completed the anastomosis in the usual fashion and then released all vessel loops and clamps.  There was a palpable  thrill in the venous outflow, and there was a palpable pulse in the artery distal to the anastomosis.  At this point, I irrigated out the surgical wound.  Surgicel was placed. There was no further active bleeding.  The subcutaneous tissue was reapproximated with a running stitch of 3-0 Vicryl.  The skin was then closed with a 4-0 Monocryl suture.  The skin was then cleaned, dried, and reinforced with Dermabond.  The patient tolerated this procedure well and was taken to the recovery room in stable condition  COMPLICATIONS: None  CONDITION: Stable   Leotis Pain    11/28/2016, 2:32 PM  This note was created with Dragon Medical transcription system. Any errors in dictation are purely unintentional.

## 2016-11-28 NOTE — Anesthesia Preprocedure Evaluation (Signed)
Anesthesia Evaluation  Patient identified by MRN, date of birth, ID band Patient awake    Reviewed: Allergy & Precautions, H&P , NPO status , Patient's Chart, lab work & pertinent test results  History of Anesthesia Complications Negative for: history of anesthetic complications  Airway Mallampati: III  TM Distance: >3 FB Neck ROM: limited    Dental no notable dental hx. (+) Poor Dentition, Chipped, Missing   Pulmonary shortness of breath and with exertion, asthma , sleep apnea , pneumonia, COPD, Current Smoker,    Pulmonary exam normal breath sounds clear to auscultation       Cardiovascular Exercise Tolerance: Poor hypertension, + angina with exertion + CAD, + Past MI, + Cardiac Stents, + Peripheral Vascular Disease, +CHF and + DOE  Normal cardiovascular examIII Rhythm:regular Rate:Normal     Neuro/Psych  Headaches, PSYCHIATRIC DISORDERS    GI/Hepatic Neg liver ROS, GERD  Controlled,  Endo/Other  diabetes, Poorly Controlled, Type 2, Insulin Dependent  Renal/GU ESRF and DialysisRenal disease     Musculoskeletal  (+) Arthritis ,   Abdominal   Peds  Hematology  (+) Blood dyscrasia, anemia ,   Anesthesia Other Findings Patient endorses some baseline weakness and numbness in surgical arm.  Past Medical History: No date: Anemia No date: Arthritis No date: Asthma No date: CHF (congestive heart failure) (HCC) No date: Chronic systolic heart failure (HCC) No date: Complication of anesthesia     Comment: hypotension No date: COPD (chronic obstructive pulmonary disease) (* No date: Coronary artery disease No date: Diabetes mellitus without complication (HCC) No date: Dialysis patient [facility])     Comment: Mon, Wed, Fri No date: End stage renal disease ([place]) No date: GERD (gastroesophageal reflux disease) No date: Headache No date: HLD (hyperlipidemia) No date: HTN (hypertension) No date: Hyperparathyroidism No  date: Myocardial infarction ([place]) No date: Peripheral vascular disease ([place]) No date: Shortness of breath dyspnea No date: Sleep apnea No date: Tobacco dependence  Past Surgical History: 05/06/2014: AMPUTATION Left     Comment: Procedure: AMPUTATION BELOW KNEE;  Surgeon:               Elam Dutch, MD;  Location: [place];                Service: Vascular;  Laterality: Left; 01/12/2015: AMPUTATION Right     Comment: Procedure: Foot transmetatarsal amputation;                Surgeon: Algernon Huxley, MD;  Location: [facility] ORS;               Service: Vascular;  Laterality: Right; 18/09/6312: APPLICATION OF WOUND VAC Right     Comment: Procedure: Application of Bio-connekt graft               and wound vac application to right foot ;                Surgeon: Algernon Huxley, MD;  Location: [facility] OR[facility];               Service: Vascular;  Laterality: Right; No date: AV FISTULA PLACEMENT Left No date: CARDIAC CATHETERIZATION     Comment: stent placement  No date: CORONARY ANGIOPLASTY 12/15/2014: PERIPHERAL VASCULAR CATHETERIZATION Right     Comment: Procedure: Lower Extremity Angiography;                Surgeon: Algernon Huxley, MD;  Location: [facility]  INVASIVE CV LAB;  Service: Cardiovascular;                Laterality: Right; 12/15/2014: PERIPHERAL VASCULAR CATHETERIZATION     Comment: Procedure: Lower Extremity Intervention;                Surgeon: Algernon Huxley, MD;  Location: [place] CV LAB;  Service: Cardiovascular;; 08/14/2015: PERIPHERAL VASCULAR CATHETERIZATION Right     Comment: Procedure: A/V Shuntogram/Fistulagram;                Surgeon: Algernon Huxley, MD;  Location: [place] CV LAB;  Service: Cardiovascular;                Laterality: Right; 08/14/2015: PERIPHERAL VASCULAR CATHETERIZATION N/A     Comment: Procedure: A/V Shunt Intervention;  Surgeon:               Algernon Huxley, MD;  Location: [place] CV               LAB;  Service:  Cardiovascular;  Laterality:               N/A; 01/11/2016: PERIPHERAL VASCULAR CATHETERIZATION N/A     Comment: Procedure: Dialysis/Perma Catheter Insertion;               Surgeon: Algernon Huxley, MD;  Location: [place] CV LAB;  Service: Cardiovascular;                Laterality: N/A; 05/04/2015: TRANSMETATARSAL AMPUTATION Right     Comment: Procedure: TRANSMETATARSAL AMPUTATION               REVISION, great toe amputation;  Surgeon: Algernon Huxley, MD;  Location: [facility] ORS;  Service:               Vascular;  Laterality: Right;     Reproductive/Obstetrics negative OB ROS                             Anesthesia Physical  Anesthesia Plan  ASA: IV  Anesthesia Plan: General   Post-op Pain Management:  [place] for Post-op pain   Induction:   PONV Risk Score and Plan:   Airway Management Planned: LMA  Additional Equipment:   Intra-op Plan:   Post-operative Plan:   Informed Consent: I have reviewed the patients History and Physical, chart, labs and discussed the procedure including the risks, benefits and alternatives for the proposed anesthesia with the patient or authorized representative who has indicated his/her understanding and acceptance.     Plan Discussed with: Anesthesiologist, CRNA and Surgeon  Anesthesia Plan Comments: (Patient informed that they are higher risk for complications from anesthesia during this procedure due to their medical history.  Patient voiced understanding.)        Anesthesia Quick Evaluation

## 2016-11-28 NOTE — Anesthesia Postprocedure Evaluation (Signed)
Anesthesia Post Note  Patient: [name]  Procedure(s) Performed: Procedure(s) (LRB): ARTERIOVENOUS (AV) FISTULA CREATION (Left)  Patient location during evaluation: PACU Anesthesia Type: General Level of consciousness: awake and alert and oriented Pain management: pain level controlled Vital Signs Assessment: post-procedure vital signs reviewed and stable Respiratory status: spontaneous breathing Cardiovascular status: blood pressure returned to baseline Anesthetic complications: no     Last Vitals:  Vitals:   11/28/16 1456 11/28/16 1511  BP: (!) 114/91 106/86  Pulse: 93 93  Resp: (!) 24 (!) 22  Temp:      Last Pain:  Vitals:   11/28/16 1511  TempSrc:   PainSc: 6                  [name]

## 2016-11-28 NOTE — Anesthesia Procedure Notes (Signed)
Procedure Name: LMA Insertion Performed by: Lance Muss Pre-anesthesia Checklist: Patient identified, Patient being monitored, Timeout performed, Emergency Drugs available and Suction available Patient Re-evaluated:Patient Re-evaluated prior to inductionOxygen Delivery Method: Circle system utilized Preoxygenation: Pre-oxygenation with 100% oxygen Intubation Type: IV induction Ventilation: Mask ventilation without difficulty LMA: LMA inserted LMA Size: 4.5 Tube type: Oral Number of attempts: 1 Placement Confirmation: positive ETCO2 and breath sounds checked- equal and bilateral Tube secured with: Tape Dental Injury: Teeth and Oropharynx as per pre-operative assessment

## 2016-11-28 NOTE — H&P (Signed)
[name] Admission History & Physical  MRN : [id number]  [name] is a 61 y.o. (1955/10/13) male who presents with chief complaint of No chief complaint on file. [name]  History of Present Illness: Patient here for elective left arm AVF creation.  Has longstanding ESRD and had right arm AVF revision previously.  Has been using a catheter for several months now.  No complaints today.    Current Facility-Administered Medications  Medication Dose Route Frequency Provider Last Rate Last Dose  . ceFAZolin (ANCEF) 2-4 GM/100ML-% IVPB           . 0.9 %  sodium chloride infusion   Intravenous Continuous Alvin Critchley, MD      . ceFAZolin (ANCEF) IVPB 2g/100 mL premix  2 g Intravenous On Call to [place], Janalyn Harder, PA-C      . Chlorhexidine Gluconate Cloth 2 % PADS 6 each  6 each Topical Once Stegmayer, Kimberly A, PA-C       And  . Chlorhexidine Gluconate Cloth 2 % PADS 6 each  6 each Topical Once Stegmayer, Janalyn Harder, PA-C        Past Medical History:  Diagnosis Date  . 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 Amputee, below knee, left ([place])   . Anemia   . Asthma   . Cardiomyopathy ([place])   . CHF (congestive heart failure) ([place])   . Chronic systolic heart failure ([place])   . Complication of anesthesia    hypotension  . COPD (chronic obstructive pulmonary disease) ([place])   . Coronary artery disease   . Dialysis patient ([place])    Mon, Wed, Fri  . End stage renal disease ([place])   . GERD (gastroesophageal reflux disease)   . Headache   . History of kidney stones   . History of pulmonary embolism   . HLD (hyperlipidemia)   . HTN (hypertension)   . Hyperparathyroidism   . Myocardial infarction ([place])   . Peripheral vascular disease ([place])   . Shortness of breath dyspnea   . Sleep apnea    NO C-PAP, Patient stated in process of  "getting one"   . Tobacco dependence     Past Surgical History:  Procedure Laterality Date  . AMPUTATION Left  05/06/2014   Procedure: AMPUTATION BELOW KNEE;  Surgeon: Elam Dutch, MD;  Location: [facility];  Service: Vascular;  Laterality: Left;  . AMPUTATION Right 01/12/2015   Procedure: Foot transmetatarsal amputation;  Surgeon: Algernon Huxley, MD;  Location: [facility];  Service: Vascular;  Laterality: Right;  . APPLICATION OF WOUND VAC Right 03/01/2015   Procedure: Application of Bio-connekt graft and wound vac application to right foot ;  Surgeon: Algernon Huxley, MD;  Location: [facility];  Service: Vascular;  Laterality: Right;  . AV FISTULA PLACEMENT Left   . CARDIAC CATHETERIZATION     stent placement   . CORONARY ANGIOPLASTY    . DIALYSIS/PERMA CATHETER INSERTION N/A 07/31/2016   Procedure: Dialysis/Perma Catheter Insertion;  Surgeon: Algernon Huxley, MD;  Location: [facility];  Service: Cardiovascular;  Laterality: N/A;  . LIGATION OF ARTERIOVENOUS  FISTULA Right 01/31/2016   Procedure: LIGATION OF ARTERIOVENOUS  FISTULA;  Surgeon: Algernon Huxley, MD;  Location: [facility];  Service: Vascular;  Laterality: Right;  . PERIPHERAL VASCULAR CATHETERIZATION Right 12/15/2014   Procedure: Lower Extremity Angiography;  Surgeon: Algernon Huxley, MD;  Location: [facility];  Service: Cardiovascular;  Laterality: Right;  . PERIPHERAL VASCULAR CATHETERIZATION  12/15/2014   Procedure: Lower Extremity Intervention;  Surgeon: Algernon Huxley, MD;  Location: [facility];  Service: Cardiovascular;;  . PERIPHERAL VASCULAR CATHETERIZATION Right 08/14/2015   Procedure: A/V Shuntogram/Fistulagram;  Surgeon: Algernon Huxley, MD;  Location: [facility];  Service: Cardiovascular;  Laterality: Right;  . PERIPHERAL VASCULAR CATHETERIZATION N/A 08/14/2015   Procedure: A/V Shunt Intervention;  Surgeon: Algernon Huxley, MD;  Location: [facility];  Service: Cardiovascular;  Laterality: N/A;  . PERIPHERAL VASCULAR CATHETERIZATION N/A 01/11/2016   Procedure: Dialysis/Perma Catheter Insertion;  Surgeon: Algernon Huxley, MD;   Location: [facility];  Service: Cardiovascular;  Laterality: N/A;  . REVISON OF ARTERIOVENOUS FISTULA Right 02/17/2016   Procedure: removal of AV fistula;  Surgeon: Serafina Mitchell, MD;  Location: [facility];  Service: Vascular;  Laterality: Right;  . REVISON OF ARTERIOVENOUS FISTULA Right 01/31/2016   Procedure: REVISON OF ARTERIOVENOUS FISTULA ( BRACHIOCEPHALIC ) W/ ARTEGRAFT;  Surgeon: Algernon Huxley, MD;  Location: [facility];  Service: Vascular;  Laterality: Right;  . TRANSMETATARSAL AMPUTATION Right 05/04/2015   Procedure: TRANSMETATARSAL AMPUTATION REVISION, great toe amputation;  Surgeon: Algernon Huxley, MD;  Location: [facility];  Service: Vascular;  Laterality: Right;    Social History Social History  Substance Use Topics  . Smoking status: Light Tobacco Smoker    Packs/day: 0.25    Types: Cigarettes  . Smokeless tobacco: Never Used     Comment: 5  . Alcohol use No  lives in a facility  Family History Family History  Problem Relation Age of Onset  . Heart failure Other   . Hypertension Other   . Leukemia Other   . Diabetes Other   No bleeding disorders, clotting disorders, porphyrias, or aneurysms  Allergies  Allergen Reactions  . Dust Mite Extract Other (See Comments)    Reaction: unknown     REVIEW OF SYSTEMS (Negative unless checked)  Constitutional: [] Weight loss  [] Fever  [] Chills Cardiac: [] Chest pain   [] Chest pressure   [] Palpitations   [] Shortness of breath when laying flat   [] Shortness of breath at rest   [x] Shortness of breath with exertion. Vascular:  [] Pain in legs with walking   [] Pain in legs at rest   [] Pain in legs when laying flat   [] Claudication   [] Pain in feet when walking  [] Pain in feet at rest  [] Pain in feet when laying flat   [] History of DVT   [] Phlebitis   [] Swelling in legs   [] Varicose veins   [x] Non-healing ulcers Pulmonary:   [] Uses home oxygen   [] Productive cough   [] Hemoptysis   [] Wheeze  [x] COPD   [] Asthma Neurologic:  [] Dizziness   [] Blackouts   [] Seizures   [] History of stroke   [] History of TIA  [] Aphasia   [] Temporary blindness   [] Dysphagia   [] Weakness or numbness in arms   [] Weakness or numbness in legs Musculoskeletal:  [x] Arthritis   [] Joint swelling   [] Joint pain   [] Low back pain Hematologic:  [] Easy bruising  [] Easy bleeding   [] Hypercoagulable state   [] Anemic  [] Hepatitis Gastrointestinal:  [] Blood in stool   [] Vomiting blood  [] Gastroesophageal reflux/heartburn   [] Difficulty swallowing. Genitourinary:  [x] Chronic kidney disease   [] Difficult urination  [] Frequent urination  [] Burning with urination   [] Blood in urine Skin:  [] Rashes   [] Ulcers   [] Wounds Psychological:  [] History of anxiety   []  History of major depression.  Physical Examination  Vitals:   11/28/16 1149  BP: 109/82  Pulse: 98  Resp: 16  Temp: 97.6 F (36.4 C)  TempSrc: Oral  SpO2: 98%  Weight: 85.7 kg (189 lb)  Height: 6\' 2"  (1.88 m)   Body mass index is 24.27 kg/m. Gen: WD/WN, NAD Head: [place]/AT, No temporalis wasting.  Ear/Nose/Throat: Hearing grossly intact, nares w/o erythema or drainage, oropharynx w/o Erythema/Exudate,  Eyes: Conjunctiva clear, sclera non-icteric Neck: Trachea midline.  No JVD.  Pulmonary:  Good air movement, respirations not labored, no use of accessory muscles.  Cardiac: RRR, normal S1, S2. Vascular: catheter in left chest  Vessel Right Left  Radial Palpable Palpable                                   Gastrointestinal: soft, non-tender/non-distended.  Musculoskeletal: M/S 5/5 throughout.  Left BKA, right TMA Neurologic: Sensation grossly intact in extremities.  Symmetrical.  Speech is fluent. Motor exam as listed above. Psychiatric: Judgment intact, Mood & affect appropriate for pt's clinical situation. Dermatologic: No rashes or ulcers noted.  No cellulitis or open wounds.      CBC Lab Results  Component Value Date   WBC 9.5 11/12/2016   HGB 12.8 (L) 11/12/2016   HCT 39.3 (L)  11/12/2016   MCV 88.0 11/12/2016   PLT 156 11/12/2016    BMET    Component Value Date/Time   NA 137 11/12/2016 0529   NA 135 (L) 04/12/2014 1341   K 5.6 (H) 11/12/2016 0529   K 5.2 01/12/2015 1210   K 3.6 04/12/2014 1341   CL 102 11/12/2016 0529   CL 97 (L) 04/12/2014 1341   CO2 23 11/12/2016 0529   CO2 28 04/12/2014 1341   GLUCOSE 109 (H) 11/12/2016 0529   GLUCOSE 85 04/12/2014 1341   BUN 82 (H) 11/12/2016 0529   BUN 40 (H) 04/12/2014 1341   CREATININE 10.58 (H) 11/12/2016 0529   CREATININE 9.58 (H) 04/12/2014 1341   CALCIUM 9.2 11/12/2016 0529   CALCIUM 9.5 04/12/2014 1341   GFRNONAA 5 (L) 11/12/2016 0529   GFRNONAA 6 (L) 03/15/2014 2323   GFRNONAA 6 (L) 02/10/2014 1119   GFRAA 5 (L) 11/12/2016 0529   GFRAA 7 (L) 03/15/2014 2323   GFRAA 7 (L) 02/10/2014 1119   Estimated Creatinine Clearance: 8.4 mL/min (A) (by C-G formula based on SCr of 10.58 mg/dL (H)).  COAG Lab Results  Component Value Date   INR 1.58 11/14/2016   INR 1.78 11/13/2016   INR 2.00 11/12/2016    Radiology Dg Chest 2 View  Result Date: 11/02/2016 CLINICAL DATA:  Cough, shortness of breath EXAM: CHEST  2 VIEW COMPARISON:  10/29/2016 FINDINGS: Small right pleural effusion/thickening with associated right lower lobe scarring, chronic. Left lung is essentially clear. No frank interstitial edema. No pneumothorax. Cardiomegaly. Left IJ dual lumen dialysis catheter terminating in the upper right atrium. Axillary vascular stent. IMPRESSION: Small right pleural effusion/thickening with associated right lower lobe scarring, chronic. No frank interstitial edema. Electronically Signed   By: Julian Hy M.D.   On: 11/02/2016 21:03   Dg Chest Port 1 View  Result Date: 11/12/2016 CLINICAL DATA:  Follow-up examination for respiratory failure, cough, shortness of breath. EXAM: PORTABLE CHEST 1 VIEW COMPARISON:  Prior radiograph from 11/10/2016. FINDINGS: Left-sided hemodialysis catheter in place, stable.  Advanced cardiomegaly. Mediastinal silhouette unchanged. Aortic atherosclerosis. Vascular stent noted at the right subclavian region. Lungs normally  inflated. Vascular congestion with interstitial prominence, suggesting pulmonary interstitial edema. Right pleural effusion with associated right basilar opacity, which may reflect atelectasis or infiltrate, overall similar to previous. No new focal airspace disease. No pneumothorax. Osseous structures unchanged. IMPRESSION: 1. Little interval change in appearance of right pleural effusion with associated right lower lobe opacity, either atelectasis or infiltrate. 2. Stable cardiomegaly with underlying mild diffuse pulmonary interstitial edema. Electronically Signed   By: Jeannine Boga M.D.   On: 11/12/2016 06:28   Dg Chest Portable 1 View  Result Date: 11/10/2016 CLINICAL DATA:  Acute onset of shortness of breath.  Cough. EXAM: PORTABLE CHEST 1 VIEW COMPARISON:  11/02/2016 FINDINGS: Large bore central venous catheter in stable position. Vascular stent noted. Markedly enlarged cardiac silhouette. Mediastinal contours appear intact. Worsening of right pleural effusion and right lower thorax airspace consolidation. Mild interstitial pulmonary edema. Osseous structures are without acute abnormality. Soft tissues are grossly normal. IMPRESSION: Worsening of right pleural effusion and right lower thorax airspace consolidation. Markedly enlarged cardiac silhouette with mild interstitial pulmonary edema. Electronically Signed   By: Fidela Salisbury M.D.   On: 11/10/2016 23:51      Assessment/Plan 1. ESRD. For left arm AVF today.  Had multiple revisions of right arm AVF previously.  Now using a catheter.  Risks and benefits discussed. 2. HTN. Stable on outpatient medications and blood pressure control important in reducing the progression of atherosclerotic disease. On appropriate oral medications. 3. PAD. Had previous RLE revascularization and TMA.   Longstanding left BKA.  No new issues.   Leotis Pain, MD  11/28/2016 11:59 AM

## 2016-11-29 ENCOUNTER — Encounter: Payer: Self-pay | Admitting: Vascular Surgery

## 2017-01-18 ENCOUNTER — Encounter: Payer: Self-pay | Admitting: Emergency Medicine

## 2017-01-18 ENCOUNTER — Emergency Department: Payer: Medicare Other

## 2017-01-18 ENCOUNTER — Inpatient Hospital Stay
Admission: EM | Admit: 2017-01-18 | Discharge: 2017-01-21 | DRG: 291 | Disposition: A | Discharge status: Home Health | Payer: Medicare Other | Attending: Internal Medicine | Admitting: Internal Medicine

## 2017-01-18 DIAGNOSIS — I5023 Acute on chronic systolic (congestive) heart failure: Secondary | ICD-10-CM | POA: Diagnosis present

## 2017-01-18 DIAGNOSIS — Z89512 Acquired absence of left leg below knee: Secondary | ICD-10-CM

## 2017-01-18 DIAGNOSIS — Z7901 Long term (current) use of anticoagulants: Secondary | ICD-10-CM

## 2017-01-18 DIAGNOSIS — Z8249 Family history of ischemic heart disease and other diseases of the circulatory system: Secondary | ICD-10-CM

## 2017-01-18 DIAGNOSIS — Z992 Dependence on renal dialysis: Secondary | ICD-10-CM | POA: Diagnosis not present

## 2017-01-18 DIAGNOSIS — N2581 Secondary hyperparathyroidism of renal origin: Secondary | ICD-10-CM | POA: Diagnosis present

## 2017-01-18 DIAGNOSIS — Z86711 Personal history of pulmonary embolism: Secondary | ICD-10-CM

## 2017-01-18 DIAGNOSIS — Z89411 Acquired absence of right great toe: Secondary | ICD-10-CM | POA: Diagnosis not present

## 2017-01-18 DIAGNOSIS — F17210 Nicotine dependence, cigarettes, uncomplicated: Secondary | ICD-10-CM | POA: Diagnosis present

## 2017-01-18 DIAGNOSIS — I252 Old myocardial infarction: Secondary | ICD-10-CM | POA: Diagnosis not present

## 2017-01-18 DIAGNOSIS — Z7951 Long term (current) use of inhaled steroids: Secondary | ICD-10-CM

## 2017-01-18 DIAGNOSIS — I132 Hypertensive heart and chronic kidney disease with heart failure and with stage 5 chronic kidney disease, or end stage renal disease: Secondary | ICD-10-CM | POA: Diagnosis present

## 2017-01-18 DIAGNOSIS — J040 Acute laryngitis: Secondary | ICD-10-CM | POA: Diagnosis present

## 2017-01-18 DIAGNOSIS — G253 Myoclonus: Secondary | ICD-10-CM | POA: Diagnosis not present

## 2017-01-18 DIAGNOSIS — K219 Gastro-esophageal reflux disease without esophagitis: Secondary | ICD-10-CM | POA: Diagnosis present

## 2017-01-18 DIAGNOSIS — I2510 Atherosclerotic heart disease of native coronary artery without angina pectoris: Secondary | ICD-10-CM | POA: Diagnosis present

## 2017-01-18 DIAGNOSIS — E785 Hyperlipidemia, unspecified: Secondary | ICD-10-CM | POA: Diagnosis present

## 2017-01-18 DIAGNOSIS — R490 Dysphonia: Secondary | ICD-10-CM | POA: Diagnosis present

## 2017-01-18 DIAGNOSIS — R7989 Other specified abnormal findings of blood chemistry: Secondary | ICD-10-CM | POA: Diagnosis present

## 2017-01-18 DIAGNOSIS — D631 Anemia in chronic kidney disease: Secondary | ICD-10-CM | POA: Diagnosis present

## 2017-01-18 DIAGNOSIS — Z79899 Other long term (current) drug therapy: Secondary | ICD-10-CM | POA: Diagnosis not present

## 2017-01-18 DIAGNOSIS — R05 Cough: Secondary | ICD-10-CM

## 2017-01-18 DIAGNOSIS — R058 Other specified cough: Secondary | ICD-10-CM

## 2017-01-18 DIAGNOSIS — I509 Heart failure, unspecified: Secondary | ICD-10-CM

## 2017-01-18 DIAGNOSIS — R0902 Hypoxemia: Secondary | ICD-10-CM

## 2017-01-18 DIAGNOSIS — I248 Other forms of acute ischemic heart disease: Secondary | ICD-10-CM | POA: Diagnosis present

## 2017-01-18 DIAGNOSIS — I10 Essential (primary) hypertension: Secondary | ICD-10-CM | POA: Diagnosis present

## 2017-01-18 DIAGNOSIS — J449 Chronic obstructive pulmonary disease, unspecified: Secondary | ICD-10-CM | POA: Diagnosis present

## 2017-01-18 DIAGNOSIS — Z888 Allergy status to other drugs, medicaments and biological substances status: Secondary | ICD-10-CM

## 2017-01-18 DIAGNOSIS — Z955 Presence of coronary angioplasty implant and graft: Secondary | ICD-10-CM | POA: Diagnosis not present

## 2017-01-18 DIAGNOSIS — Z7982 Long term (current) use of aspirin: Secondary | ICD-10-CM | POA: Diagnosis not present

## 2017-01-18 DIAGNOSIS — R778 Other specified abnormalities of plasma proteins: Secondary | ICD-10-CM

## 2017-01-18 DIAGNOSIS — J9601 Acute respiratory failure with hypoxia: Secondary | ICD-10-CM | POA: Diagnosis present

## 2017-01-18 DIAGNOSIS — R0602 Shortness of breath: Secondary | ICD-10-CM | POA: Diagnosis present

## 2017-01-18 DIAGNOSIS — N186 End stage renal disease: Secondary | ICD-10-CM | POA: Diagnosis present

## 2017-01-18 LAB — CBC WITH DIFFERENTIAL/PLATELET
BASOS ABS: 0.1 10*3/uL (ref 0–0.1)
BASOS PCT: 1 %
Eosinophils Absolute: 0.1 10*3/uL (ref 0–0.7)
Eosinophils Relative: 1 %
HEMATOCRIT: 31.9 % — AB (ref 40.0–52.0)
Hemoglobin: 10.4 g/dL — ABNORMAL LOW (ref 13.0–18.0)
LYMPHS PCT: 13 %
Lymphs Abs: 1.4 10*3/uL (ref 1.0–3.6)
MCH: 28.8 pg (ref 26.0–34.0)
MCHC: 32.6 g/dL (ref 32.0–36.0)
MCV: 88.4 fL (ref 80.0–100.0)
MONO ABS: 1.7 10*3/uL — AB (ref 0.2–1.0)
Monocytes Relative: 16 %
NEUTROS ABS: 7.4 10*3/uL — AB (ref 1.4–6.5)
Neutrophils Relative %: 69 %
PLATELETS: 213 10*3/uL (ref 150–440)
RBC: 3.61 MIL/uL — AB (ref 4.40–5.90)
RDW: 18.3 % — AB (ref 11.5–14.5)
WBC: 10.6 10*3/uL (ref 3.8–10.6)

## 2017-01-18 LAB — COMPREHENSIVE METABOLIC PANEL
ALBUMIN: 2.9 g/dL — AB (ref 3.5–5.0)
ALK PHOS: 100 U/L (ref 38–126)
ALT: 32 U/L (ref 17–63)
ANION GAP: 16 — AB (ref 5–15)
AST: 49 U/L — AB (ref 15–41)
BUN: 56 mg/dL — AB (ref 6–20)
CO2: 22 mmol/L (ref 22–32)
Calcium: 9.4 mg/dL (ref 8.9–10.3)
Chloride: 100 mmol/L — ABNORMAL LOW (ref 101–111)
Creatinine, Ser: 9.02 mg/dL — ABNORMAL HIGH (ref 0.61–1.24)
GFR calc Af Amer: 6 mL/min — ABNORMAL LOW (ref >60)
GFR calc non Af Amer: 6 mL/min — ABNORMAL LOW (ref >60)
GLUCOSE: 113 mg/dL — AB (ref 65–99)
POTASSIUM: 4.3 mmol/L (ref 3.5–5.1)
SODIUM: 138 mmol/L (ref 135–145)
Total Bilirubin: 1 mg/dL (ref 0.3–1.2)
Total Protein: 8 g/dL (ref 6.5–8.1)

## 2017-01-18 LAB — LIPASE, BLOOD: Lipase: 41 U/L (ref 11–51)

## 2017-01-18 LAB — BRAIN NATRIURETIC PEPTIDE: B Natriuretic Peptide: >4500 pg/mL — ABNORMAL HIGH (ref 0.0–100.0)

## 2017-01-18 LAB — LACTIC ACID, PLASMA: Lactic Acid, Venous: 2 mmol/L (ref 0.5–1.9)

## 2017-01-18 LAB — TROPONIN I: Troponin I: 0.54 ng/mL (ref <0.03)

## 2017-01-18 MED ORDER — ALBUTEROL SULFATE (2.5 MG/3ML) 0.083% IN NEBU
5.0000 mg | INHALATION_SOLUTION | Freq: Once | RESPIRATORY_TRACT | Status: AC
  Administered 2017-01-18: 5 mg via RESPIRATORY_TRACT
  Filled 2017-01-18: qty 6

## 2017-01-18 MED ORDER — SODIUM CHLORIDE 0.9 % IV BOLUS (SEPSIS)
500.0000 mL | INTRAVENOUS | Status: AC
  Administered 2017-01-18: 500 mL via INTRAVENOUS

## 2017-01-18 MED ORDER — IPRATROPIUM-ALBUTEROL 0.5-2.5 (3) MG/3ML IN SOLN
3.0000 mL | Freq: Once | RESPIRATORY_TRACT | Status: AC
  Administered 2017-01-18: 3 mL via RESPIRATORY_TRACT
  Filled 2017-01-18: qty 3

## 2017-01-18 NOTE — ED Provider Notes (Signed)
[facility] Emergency Department Provider Note  ____________________________________________   First MD Initiated Contact with Patient 01/18/17 2001     (approximate)  I have reviewed the triage vital signs and the nursing notes.   HISTORY  Chief Complaint Shortness of Breath    HPI [name] is a 61 y.o. male brought by EMS for evaluation of shortness of breath.  When they arrived they found that he had an oxygen level in the 80s with some coarse breath sounds and thick productive cough.  He states that he has been getting worse over the last few days.  Exertion makes his symptoms worse and nothing makes it better.  He has not had any fever/chills, chest pain, abdominal pain.  He is an end-stage renal patient on dialysis and he states he had his full course of dialysis yesterday.  He states that they told him "I did not have any fluid to take off".  He states he feels like he has pneumonia which she has had in the past.  He uses oxygen at dialysis but he does not have any at home because "they will not write it for me".   Past Medical History:  Diagnosis Date  . 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 Amputee, below knee, left ([place])   . Anemia   . Asthma   . Cardiomyopathy ([place])   . CHF (congestive heart failure) ([place])   . Chronic systolic heart failure ([place])   . Complication of anesthesia    hypotension  . COPD (chronic obstructive pulmonary disease) ([place])   . Coronary artery disease   . Dialysis patient ([place])    Mon, Wed, Fri  . End stage renal disease ([place])   . GERD (gastroesophageal reflux disease)   . Headache   . History of kidney stones   . History of pulmonary embolism   . HLD (hyperlipidemia)   . HTN (hypertension)   . Hyperparathyroidism   . Myocardial infarction ([place])   . Peripheral vascular disease ([place])   . Shortness of breath dyspnea   . Sleep apnea    NO C-PAP, Patient stated in process of  "getting one"   .  Tobacco dependence     Patient Active Problem List   Diagnosis Date Noted  . Acute on chronic systolic CHF (congestive heart failure) ([place]) 01/18/2017  . Respiratory failure with hypoxia ([place]) 11/11/2016  . Acute respiratory failure ([place]) 10/28/2016  . Pulmonary embolism ([place]) 08/03/2016  . NSTEMI (non-ST elevated myocardial infarction) ([place]) 07/26/2016  . Acute respiratory failure with hypoxia ([place]) 05/24/2016  . Right lower lobe pneumonia ([place]) 05/24/2016  . Elevated troponin 05/24/2016  . Pulmonary edema 05/21/2016  . Kidney dialysis as the cause of abnormal reaction of the patient, or of later complication, without mention of misadventure at the time of the procedure (CODE) 03/05/2016  . PVD (peripheral vascular disease) ([place]) 03/05/2016  . Preop examination 05/02/2015  . Chronic systolic heart failure ([place])   . Sepsis ([place]) 01/13/2015  . Acute encephalopathy 01/13/2015  . Hyperkalemia 01/13/2015  . Gangrene of foot ([place]) 05/04/2014  . ESRD (end stage renal disease) on dialysis ([place]) 05/04/2014  . CAD (coronary artery disease) 05/04/2014  . Normocytic anemia 05/04/2014  . Hyperlipidemia 04/26/2010  . HYPERTENSION, BENIGN 04/26/2010  . CAD, NATIVE VESSEL 04/26/2010    Past Surgical History:  Procedure Laterality Date  . AMPUTATION Left 05/06/2014   Procedure: AMPUTATION BELOW KNEE;  Surgeon: Elam Dutch,  MD;  Location: [place];  Service: Vascular;  Laterality: Left;  . AMPUTATION Right 01/12/2015   Procedure: Foot transmetatarsal amputation;  Surgeon: Algernon Huxley, MD;  Location: [facility];  Service: Vascular;  Laterality: Right;  . APPLICATION OF WOUND VAC Right 03/01/2015   Procedure: Application of Bio-connekt graft and wound vac application to right foot ;  Surgeon: Algernon Huxley, MD;  Location: [facility];  Service: Vascular;  Laterality: Right;  . AV FISTULA PLACEMENT Left   . AV FISTULA PLACEMENT Left 11/28/2016   Procedure: ARTERIOVENOUS (AV) FISTULA CREATION;  Surgeon:  Algernon Huxley, MD;  Location: [facility];  Service: Vascular;  Laterality: Left;  . CARDIAC CATHETERIZATION     stent placement   . CORONARY ANGIOPLASTY    . DIALYSIS/PERMA CATHETER INSERTION N/A 07/31/2016   Procedure: Dialysis/Perma Catheter Insertion;  Surgeon: Algernon Huxley, MD;  Location: [place] [facility];  Service: Cardiovascular;  Laterality: N/A;  . LIGATION OF ARTERIOVENOUS  FISTULA Right 01/31/2016   Procedure: LIGATION OF ARTERIOVENOUS  FISTULA;  Surgeon: Algernon Huxley, MD;  Location: [facility];  Service: Vascular;  Laterality: Right;  . PERIPHERAL VASCULAR CATHETERIZATION Right 12/15/2014   Procedure: Lower Extremity Angiography;  Surgeon: Algernon Huxley, MD;  Location: [place] [facility];  Service: Cardiovascular;  Laterality: Right;  . PERIPHERAL VASCULAR CATHETERIZATION  12/15/2014   Procedure: Lower Extremity Intervention;  Surgeon: Algernon Huxley, MD;  Location: [place] [facility];  Service: Cardiovascular;;  . PERIPHERAL VASCULAR CATHETERIZATION Right 08/14/2015   Procedure: A/V Shuntogram/Fistulagram;  Surgeon: Algernon Huxley, MD;  Location: [place] [facility];  Service: Cardiovascular;  Laterality: Right;  . PERIPHERAL VASCULAR CATHETERIZATION N/A 08/14/2015   Procedure: A/V Shunt Intervention;  Surgeon: Algernon Huxley, MD;  Location: [place] [facility];  Service: Cardiovascular;  Laterality: N/A;  . PERIPHERAL VASCULAR CATHETERIZATION N/A 01/11/2016   Procedure: Dialysis/Perma Catheter Insertion;  Surgeon: Algernon Huxley, MD;  Location: [place] [facility];  Service: Cardiovascular;  Laterality: N/A;  . REVISON OF ARTERIOVENOUS FISTULA Right 02/17/2016   Procedure: removal of AV fistula;  Surgeon: Serafina Mitchell, MD;  Location: [facility];  Service: Vascular;  Laterality: Right;  . REVISON OF ARTERIOVENOUS FISTULA Right 01/31/2016   Procedure: REVISON OF ARTERIOVENOUS FISTULA ( BRACHIOCEPHALIC ) W/ ARTEGRAFT;  Surgeon: Algernon Huxley, MD;  Location: [facility];  Service: Vascular;  Laterality: Right;    . TRANSMETATARSAL AMPUTATION Right 05/04/2015   Procedure: TRANSMETATARSAL AMPUTATION REVISION, great toe amputation;  Surgeon: Algernon Huxley, MD;  Location: [facility];  Service: Vascular;  Laterality: Right;    Prior to Admission medications   Medication Sig Start Date End Date Taking? Authorizing Provider  acetaminophen (TYLENOL) 500 MG tablet Take 1,000 mg by mouth every 6 (six) hours as needed (discomfort).    Yes [provider]  aspirin 325 MG tablet Take 325 mg by mouth daily.    Yes [provider]  atorvastatin (LIPITOR) 10 MG tablet Take 10 mg by mouth at bedtime.    Yes [provider]  calcium acetate (PHOSLO) 667 MG capsule Take 667 mg by mouth 3 (three) times daily with meals.   Yes [provider]  cetirizine (ZYRTEC) 10 MG tablet Take 10 mg by mouth daily.   Yes [provider]  docusate sodium (COLACE) 100 MG capsule Take 100 mg by mouth 2 (two) times daily as needed for mild constipation.    Yes [provider]  HYDROcodone-acetaminophen (  NORCO/VICODIN) 5-325 MG tablet Take 1 tablet by mouth every 6 (six) hours as needed for severe pain.   Yes [provider]  isosorbide mononitrate (IMDUR) 30 MG 24 hr tablet Take 1 tablet (30 mg total) by mouth daily. Patient taking differently: Take 15 mg by mouth daily.  08/22/16  Yes Demetrios Loll, MD  midodrine (PROAMATINE) 10 MG tablet Take 5 mg by mouth 2 (two) times daily.   Yes [provider]  Multiple Vitamin (MULTIVITAMIN WITH MINERALS) TABS tablet Take 1 tablet by mouth 2 (two) times daily.   Yes [provider]  oxyCODONE (OXY IR/ROXICODONE) 5 MG immediate release tablet Take 5 mg by mouth every 8 (eight) hours as needed for severe pain.   Yes [provider]  pregabalin (LYRICA) 75 MG capsule Take 75 mg by mouth 2 (two) times daily.   Yes [provider]  sevelamer carbonate (RENVELA) 800 MG tablet Take 800 mg by mouth daily.    Yes  [provider]  budesonide-formoterol (SYMBICORT) 160-4.5 MCG/ACT inhaler Inhale 2 puffs into the lungs 2 (two) times daily. Reported on 08/14/2015    [provider]  fluticasone (FLONASE) 50 MCG/ACT nasal spray Place 1 spray into both nostrils daily.    [provider]  Ipratropium-Albuterol (COMBIVENT RESPIMAT) 20-100 MCG/ACT AERS respimat Inhale 1 puff into the lungs every 6 (six) hours as needed for wheezing or shortness of breath.    [provider]  ipratropium-albuterol (DUONEB) 0.5-2.5 (3) MG/3ML SOLN Take 3 mLs by nebulization every 4 (four) hours as needed (SOB).    [provider]  nitroGLYCERIN (NITROSTAT) 0.4 MG SL tablet Place 0.4 mg under the tongue every 5 (five) minutes as needed for chest pain.    [provider]  Nutritional Supplements (FEEDING SUPPLEMENT, NEPRO CARB STEADY,) LIQD Take 237 mLs by mouth daily.    [provider]  pantoprazole (PROTONIX) 20 MG tablet Take 20 mg by mouth daily.    [provider]  polyethylene glycol (MIRALAX / GLYCOLAX) packet Take 17 g by mouth daily as needed for moderate constipation. Hold for loose stools.     [provider]  tiotropium (SPIRIVA) 18 MCG inhalation capsule Place 18 mcg into inhaler and inhale daily.    [provider]  warfarin (COUMADIN) 2.5 MG tablet Take 2.5 mg by mouth daily.     [provider]  warfarin (COUMADIN) 3 MG tablet Take 3 mg by mouth daily.    [provider]    Allergies Dust mite extract  Family History  Problem Relation Age of Onset  . Heart failure Other   . Hypertension Other   . Leukemia Other   . Diabetes Other     Social History Social History  Substance Use Topics  . Smoking status: Light Tobacco Smoker    Packs/day: 0.25    Types: Cigarettes  . Smokeless tobacco: Never Used     Comment: 5  . Alcohol use No    Review of Systems Constitutional: No fever/chills Cardiovascular:  Denies chest pain. Respiratory: +shortness of breath. Gastrointestinal: No abdominal pain.  No nausea, no vomiting.  No diarrhea.  No constipation. Genitourinary: Negative for dysuria. Musculoskeletal: Negative for neck pain.  Negative for back pain. Integumentary: Negative for rash. Neurological: Negative for headaches, focal weakness or numbness.   ____________________________________________   PHYSICAL EXAM:  VITAL SIGNS: ED Triage Vitals  Enc Vitals Group     BP 01/18/17 1938 105/82     Pulse  Rate 01/18/17 1938 (!) 106     Resp 01/18/17 1938 (!) 33     Temp 01/18/17 1938 99.9 F (37.7 C)     Temp Source 01/18/17 1938 Oral     SpO2 01/18/17 1938 96 %     Weight 01/18/17 1942 98 kg (216 lb)     Height 01/18/17 1942 1.88 m (6\' 2" )     Head Circumference --      Peak Flow --      Pain Score 01/18/17 1937 0     Pain Loc --      Pain Edu? --      Excl. in [place]? --     Constitutional: Alert and oriented. Appears chronically ill and in mild respiratory distress Eyes: Conjunctivae are normal.  Head: Atraumatic. Nose: No congestion/rhinnorhea. Mouth/Throat: Mucous membranes are moist. Neck: No stridor.  No meningeal signs.   Cardiovascular: Mild tachycardia, regular rhythm. Good peripheral circulation. Grossly normal heart sounds. Respiratory: Mildly Increased respiratory effort.  No retractions. Coarse breath sounds throughout.  Frequent thick cough Gastrointestinal: Soft and nontender. No distention.  Musculoskeletal: s/p LLE amputation. No peripheral edema in RLE. Neurologic:  Normal speech and language. No gross focal neurologic deficits are appreciated.  Skin:  Skin is warm, dry and intact. No rash noted.   ____________________________________________   LABS (all labs ordered are listed, but only abnormal results are displayed)  Labs Reviewed  BRAIN NATRIURETIC PEPTIDE - Abnormal; Notable for the following:       Result Value   B Natriuretic Peptide >4,500.0 (*)      All other components within normal limits  CBC WITH DIFFERENTIAL/PLATELET - Abnormal; Notable for the following:    RBC 3.61 (*)    Hemoglobin 10.4 (*)    HCT 31.9 (*)    RDW 18.3 (*)    Neutro Abs 7.4 (*)    Monocytes Absolute 1.7 (*)    All other components within normal limits  TROPONIN I - Abnormal; Notable for the following:    Troponin I 0.54 (*)    All other components within normal limits  COMPREHENSIVE METABOLIC PANEL - Abnormal; Notable for the following:    Chloride 100 (*)    Glucose, Bld 113 (*)    BUN 56 (*)    Creatinine, Ser 9.02 (*)    Albumin 2.9 (*)    AST 49 (*)    GFR calc non Af Amer 6 (*)    GFR calc Af Amer 6 (*)    Anion gap 16 (*)    All other components within normal limits  LACTIC ACID, PLASMA - Abnormal; Notable for the following:    Lactic Acid, Venous 2.0 (*)    All other components within normal limits  TROPONIN I - Abnormal; Notable for the following:    Troponin I 0.54 (*)    All other components within normal limits  LIPASE, BLOOD  LACTIC ACID, PLASMA  TROPONIN I  TROPONIN I   ____________________________________________  EKG  ED ECG REPORT I, Tailyn Hantz, the attending physician, personally viewed and interpreted this ECG.  Date: 01/18/2017 EKG Time: 19:42 Rate: 104 Rhythm: sinus tachycardia QRS Axis: LAD Intervals: IVH w/ IVCD ST/T Wave abnormalities: Non-specific ST segment / T-wave changes, but no evidence of acute ischemia. Narrative Interpretation: no evidence of acute ischemia  ____________________________________________  RADIOLOGY   Dg Chest 2 View  Result Date: 01/18/2017 CLINICAL DATA:  Shortness of Breath EXAM: CHEST  2 VIEW COMPARISON:  11/12/2016 FINDINGS: Cardiac  shadow remains enlarged. Left dialysis catheter is noted. Lungs are well aerated bilaterally. Patchy chronic changes are again noted in the lateral right lung base. No new focal abnormality is seen. No bony abnormality is noted. IMPRESSION: Stable  changes in the right lung base. Electronically Signed   By: Inez Catalina M.D.   On: 01/18/2017 20:36    ____________________________________________   PROCEDURES  Critical Care performed: No   Procedure(s) performed:   Procedures   ____________________________________________   INITIAL IMPRESSION / ASSESSMENT AND PLAN / ED COURSE  Pertinent labs & imaging results that were available during my care of the patient were reviewed by me and considered in my medical decision making (see chart for details).  Concern for pneumonia, CHF exacerbation, COPD exacerbation, etc.  Less likely ACS or PE.  I will treat with a small fluid bolus.  The patient is comfortable on oxygen and has saturation in the upper 90s to 100%.  I will evaluate broadly and reassess.   Clinical Course as of Jan 19 100  Sat Jan 18, 2017  2140 The patient is noted to have a lactate of 2. With the current information available to me, I don't think the patient is in sepsis nor septic shock. The lactate is 2.0, is related to worsening heart failure.  Discussed case with Dr. Jannifer Franklin with hospitalist service.  Initially I ordered 596mL NS due to probable PNA, but the patient has no evidence of PNA on CXR (stable from prior), no leukocytosis, and unmeasurably high BNP.  Under the circumstances, I personally stopped the IV fluids and Dr. Jannifer Franklin will evaluate to determine the appropriate fluid balance.    [CF]    Clinical Course User Index [CF] Hinda Kehr, MD    ____________________________________________  FINAL CLINICAL IMPRESSION(S) / ED DIAGNOSES  Final diagnoses:  Acute respiratory failure with hypoxemia ([place])  Acute on chronic congestive heart failure, unspecified heart failure type ([place])  Productive cough  ESRD on hemodialysis ([place])  Elevated troponin I level  Demand ischemia ([place])     MEDICATIONS GIVEN DURING THIS VISIT:  Medications  albuterol (PROVENTIL) (2.5 MG/3ML) 0.083% nebulizer solution 5 mg  (5 mg Nebulization Given 01/18/17 2038)  ipratropium-albuterol (DUONEB) 0.5-2.5 (3) MG/3ML nebulizer solution 3 mL (3 mLs Nebulization Given 01/18/17 2125)  sodium chloride 0.9 % bolus 500 mL (0 mLs Intravenous Stopped 01/18/17 2258)     NEW OUTPATIENT MEDICATIONS STARTED DURING THIS VISIT:  New Prescriptions   No medications on file    Modified Medications   No medications on file    Discontinued Medications   BENZONATATE (TESSALON) 100 MG CAPSULE    Take 1 capsule (100 mg total) by mouth 3 (three) times daily as needed for cough.   OXYCODONE-ACETAMINOPHEN (PERCOCET/ROXICET) 5-325 MG TABLET    Take 1-2 tablets by mouth every 6 (six) hours as needed for severe pain.     Note:  This document was prepared using Dragon voice recognition software and may include unintentional dictation errors.    Hinda Kehr, MD 01/19/17 [phone]

## 2017-01-18 NOTE — ED Triage Notes (Signed)
Pt arrives via [facility] from home with c/o SHOB. Per EMS, pt's O2 at home was in the 80's with possible Rhonchi. Pt had a CO2 level of 38, 106 PR, and productive cough per EMS. Pt has dialysis shunt in left arm and left chest. Pt is able to answer questions and speak clearly but with effort at this time in triage.

## 2017-01-18 NOTE — ED Notes (Signed)
Pt to xray

## 2017-01-18 NOTE — H&P (Signed)
[facility] [place] NAME: [name]    MR#:  [id number]  DATE OF BIRTH:  09-22-1955  DATE OF ADMISSION:  01/18/2017  PRIMARY CARE PHYSICIAN: [facility] [place]   REQUESTING/REFERRING PHYSICIAN: Karma Greaser, MD  CHIEF COMPLAINT:   Chief Complaint  Patient presents with  . Shortness of Breath    HISTORY OF PRESENT ILLNESS:  [name]  is a 61 y.o. male who presents with persistent shortness of breath. Patient was treated here for pneumonia a few months ago, and had a PE several months prior to that. He states that since his pneumonia couple of months ago he has not recovered from his persistent shortness of breath. Echocardiogram at that time also showed significant systolic heart failure with an EF of 20-25%. He states that his breathing got worse over last couple of days and he came in to the ED for the same.workup here shows heart failure exacerbation with BNP significantly elevated. He also has some elevation of his troponin above what seems to be his baseline, and does state that he had some chest pain for which she took 2 nitros which occurred a couple of days ago. hospitalists were called for admission  PAST MEDICAL HISTORY:   Past Medical History:  Diagnosis Date  . 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 Amputee, below knee, left ([place])   . Anemia   . Asthma   . Cardiomyopathy ([place])   . CHF (congestive heart failure) ([place])   . Chronic systolic heart failure ([place])   . Complication of anesthesia    hypotension  . COPD (chronic obstructive pulmonary disease) ([place])   . Coronary artery disease   . Dialysis patient ([place])    Mon, Wed, Fri  . End stage renal disease ([place])   . GERD (gastroesophageal reflux disease)   . Headache   . History of kidney stones   . History of pulmonary embolism   . HLD (hyperlipidemia)   . HTN (hypertension)   . Hyperparathyroidism   . Myocardial infarction  ([place])   . Peripheral vascular disease ([place])   . Shortness of breath dyspnea   . Sleep apnea    NO C-PAP, Patient stated in process of  "getting one"   . Tobacco dependence     PAST SURGICAL HISTORY:   Past Surgical History:  Procedure Laterality Date  . AMPUTATION Left 05/06/2014   Procedure: AMPUTATION BELOW KNEE;  Surgeon: Elam Dutch, MD;  Location: [facility];  Service: Vascular;  Laterality: Left;  . AMPUTATION Right 01/12/2015   Procedure: Foot transmetatarsal amputation;  Surgeon: Algernon Huxley, MD;  Location: [facility];  Service: Vascular;  Laterality: Right;  . APPLICATION OF WOUND VAC Right 03/01/2015   Procedure: Application of Bio-connekt graft and wound vac application to right foot ;  Surgeon: Algernon Huxley, MD;  Location: [facility];  Service: Vascular;  Laterality: Right;  . AV FISTULA PLACEMENT Left   . AV FISTULA PLACEMENT Left 11/28/2016   Procedure: ARTERIOVENOUS (AV) FISTULA CREATION;  Surgeon: Algernon Huxley, MD;  Location: [facility];  Service: Vascular;  Laterality: Left;  . CARDIAC CATHETERIZATION     stent placement   . CORONARY ANGIOPLASTY    . DIALYSIS/PERMA CATHETER INSERTION N/A 07/31/2016   Procedure: Dialysis/Perma Catheter Insertion;  Surgeon: Algernon Huxley, MD;  Location: [facility];  Service: Cardiovascular;  Laterality: N/A;  . LIGATION OF ARTERIOVENOUS  FISTULA  Right 01/31/2016   Procedure: LIGATION OF ARTERIOVENOUS  FISTULA;  Surgeon: Algernon Huxley, MD;  Location: [facility];  Service: Vascular;  Laterality: Right;  . PERIPHERAL VASCULAR CATHETERIZATION Right 12/15/2014   Procedure: Lower Extremity Angiography;  Surgeon: Algernon Huxley, MD;  Location: [facility];  Service: Cardiovascular;  Laterality: Right;  . PERIPHERAL VASCULAR CATHETERIZATION  12/15/2014   Procedure: Lower Extremity Intervention;  Surgeon: Algernon Huxley, MD;  Location: [facility];  Service: Cardiovascular;;  . PERIPHERAL VASCULAR CATHETERIZATION Right 08/14/2015   Procedure: A/V  Shuntogram/Fistulagram;  Surgeon: Algernon Huxley, MD;  Location: [facility];  Service: Cardiovascular;  Laterality: Right;  . PERIPHERAL VASCULAR CATHETERIZATION N/A 08/14/2015   Procedure: A/V Shunt Intervention;  Surgeon: Algernon Huxley, MD;  Location: [facility];  Service: Cardiovascular;  Laterality: N/A;  . PERIPHERAL VASCULAR CATHETERIZATION N/A 01/11/2016   Procedure: Dialysis/Perma Catheter Insertion;  Surgeon: Algernon Huxley, MD;  Location: [facility] [facility];  Service: Cardiovascular;  Laterality: N/A;  . REVISON OF ARTERIOVENOUS FISTULA Right 02/17/2016   Procedure: removal of AV fistula;  Surgeon: Serafina Mitchell, MD;  Location: [facility];  Service: Vascular;  Laterality: Right;  . REVISON OF ARTERIOVENOUS FISTULA Right 01/31/2016   Procedure: REVISON OF ARTERIOVENOUS FISTULA ( BRACHIOCEPHALIC ) W/ ARTEGRAFT;  Surgeon: Algernon Huxley, MD;  Location: [facility];  Service: Vascular;  Laterality: Right;  . TRANSMETATARSAL AMPUTATION Right 05/04/2015   Procedure: TRANSMETATARSAL AMPUTATION REVISION, great toe amputation;  Surgeon: Algernon Huxley, MD;  Location: [facility];  Service: Vascular;  Laterality: Right;    SOCIAL HISTORY:   Social History  Substance Use Topics  . Smoking status: Light Tobacco Smoker    Packs/day: 0.25    Types: Cigarettes  . Smokeless tobacco: Never Used     Comment: 5  . Alcohol use No    FAMILY HISTORY:   Family History  Problem Relation Age of Onset  . Heart failure Other   . Hypertension Other   . Leukemia Other   . Diabetes Other     DRUG ALLERGIES:   Allergies  Allergen Reactions  . Dust Mite Extract Other (See Comments)    Reaction: unknown    MEDICATIONS AT HOME:   Prior to Admission medications   Medication Sig Start Date End Date Taking? Authorizing Provider  acetaminophen (TYLENOL) 500 MG tablet Take 1,000 mg by mouth every 6 (six) hours as needed (discomfort).     [provider]  aspirin 325 MG tablet Take 325 mg by  mouth daily.     [provider]  atorvastatin (LIPITOR) 10 MG tablet Take 10 mg by mouth at bedtime.     [provider]  budesonide-formoterol (SYMBICORT) 160-4.5 MCG/ACT inhaler Inhale 2 puffs into the lungs 2 (two) times daily. Reported on 08/14/2015    [provider]  calcium acetate (PHOSLO) 667 MG capsule Take 667 mg by mouth 3 (three) times daily with meals.    [provider]  docusate sodium (COLACE) 100 MG capsule Take 100 mg by mouth 2 (two) times daily as needed for mild constipation.     [provider]  fluticasone (FLONASE) 50 MCG/ACT nasal spray Place 1 spray into both nostrils daily.    [provider]  Ipratropium-Albuterol (COMBIVENT RESPIMAT) 20-100 MCG/ACT AERS respimat Inhale 1 puff into the lungs every 6 (six) hours as needed for wheezing or shortness of breath.    [provider]  ipratropium-albuterol (DUONEB) 0.5-2.5 (3) MG/3ML SOLN Take 3 mLs by nebulization every 4 (four) hours as needed (SOB).    [provider]  isosorbide mononitrate (IMDUR) 30 MG 24 hr tablet Take 1 tablet (30 mg total) by mouth daily. 08/22/16   Demetrios Loll, MD  midodrine (PROAMATINE) 10 MG tablet Take 5 mg by mouth 2 (two) times daily.    [provider]  Multiple Vitamin (MULTIVITAMIN WITH MINERALS) TABS tablet Take 1 tablet by mouth 2 (two) times daily.    [provider]  nitroGLYCERIN (NITROSTAT) 0.4 MG SL tablet Place 0.4 mg under the tongue every 5 (five) minutes as needed for chest pain.    [provider]  Nutritional Supplements (FEEDING SUPPLEMENT, NEPRO CARB STEADY,) LIQD Take 237 mLs by mouth daily.    [provider]  oxyCODONE (OXY IR/ROXICODONE) 5 MG immediate release tablet Take 5 mg by mouth every 8 (eight) hours as needed for severe pain.    [provider]  oxyCODONE-acetaminophen (PERCOCET/ROXICET) 5-325 MG tablet Take 1-2 tablets by mouth every 6 (six) hours as needed  for severe pain. 11/28/16   Algernon Huxley, MD  pantoprazole (PROTONIX) 20 MG tablet Take 20 mg by mouth daily.    [provider]  polyethylene glycol (MIRALAX / GLYCOLAX) packet Take 17 g by mouth daily as needed for moderate constipation. Hold for loose stools.     [provider]  pregabalin (LYRICA) 75 MG capsule Take 75 mg by mouth 2 (two) times daily.    [provider]  sevelamer carbonate (RENVELA) 800 MG tablet Take 800 mg by mouth daily.     [provider]  tiotropium (SPIRIVA) 18 MCG inhalation capsule Place 18 mcg into inhaler and inhale daily.    [provider]  warfarin (COUMADIN) 2.5 MG tablet Take 2.5 mg by mouth daily.     [provider]  warfarin (COUMADIN) 3 MG tablet Take 3 mg by mouth daily.    [provider]    REVIEW OF SYSTEMS:  Review of Systems  Constitutional: Negative for chills, fever, malaise/fatigue and weight loss.  HENT: Negative for ear pain, hearing loss and tinnitus.   Eyes: Negative for blurred vision, double vision, pain and redness.  Respiratory: Positive for cough, shortness of breath and wheezing. Negative for hemoptysis.   Cardiovascular: Positive for chest pain. Negative for palpitations, orthopnea and leg swelling.  Gastrointestinal: Negative for abdominal pain, constipation, diarrhea, nausea and vomiting.  Genitourinary: Negative for dysuria, frequency and hematuria.  Musculoskeletal: Negative for back pain, joint pain and neck pain.  Skin:       No acne, rash, or lesions  Neurological: Negative for dizziness, tremors, focal weakness and weakness.  Endo/Heme/Allergies: Negative for polydipsia. Does not bruise/bleed easily.  Psychiatric/Behavioral: Negative for depression. The patient is not nervous/anxious and does not have insomnia.      VITAL SIGNS:   Vitals:   01/18/17 1938 01/18/17 1942  BP: 105/82   Pulse: (!) 106   Resp: (!) 33   Temp: 99.9 F (37.7 C)   TempSrc: Oral    SpO2: 96%   Weight:  98 kg (216 lb)  Height:  6\' 2"  (1.88 m)   Wt Readings from Last 3 Encounters:  01/18/17 98 kg (216 lb)  11/28/16 85.7 kg (189 lb)  11/13/16 86 kg (189 lb 9.6 oz)    PHYSICAL EXAMINATION:  Physical Exam  Vitals reviewed. Constitutional: He is oriented to person, place, and time. He appears  well-developed and well-nourished. No distress.  HENT:  Head: Normocephalic and atraumatic.  Mouth/Throat: Oropharynx is clear and moist.  Eyes: Pupils are equal, round, and reactive to light. Conjunctivae and EOM are normal. No scleral icterus.  Neck: Normal range of motion. Neck supple. No JVD present. No thyromegaly present.  Cardiovascular: Regular rhythm and intact distal pulses.  Exam reveals no gallop and no friction rub.   No murmur heard. tachycardic  Respiratory: He is in respiratory distress (Mild). He has no wheezes. He has no rales.  Coarse breath sounds  GI: Soft. Bowel sounds are normal. He exhibits no distension. There is no tenderness.  Musculoskeletal: Normal range of motion. He exhibits deformity (left BKA). He exhibits no edema.  No arthritis, no gout  Lymphadenopathy:    He has no cervical adenopathy.  Neurological: He is alert and oriented to person, place, and time. No cranial nerve deficit.  No dysarthria, no aphasia  Skin: Skin is warm and dry. No rash noted. No erythema.  Psychiatric: He has a normal mood and affect. His behavior is normal. Judgment and thought content normal.    LABORATORY PANEL:   CBC  Recent Labs Lab 01/18/17 2002  WBC 10.6  HGB 10.4*  HCT 31.9*  PLT 213   ------------------------------------------------------------------------------------------------------------------  Chemistries   Recent Labs Lab 01/18/17 2002  NA 138  K 4.3  CL 100*  CO2 22  GLUCOSE 113*  BUN 56*  CREATININE 9.02*  CALCIUM 9.4  AST 49*  ALT 32  ALKPHOS 100  BILITOT 1.0    ------------------------------------------------------------------------------------------------------------------  Cardiac Enzymes  Recent Labs Lab 01/18/17 2002  TROPONINI 0.54*   ------------------------------------------------------------------------------------------------------------------  RADIOLOGY:  Dg Chest 2 View  Result Date: 01/18/2017 CLINICAL DATA:  Shortness of Breath EXAM: CHEST  2 VIEW COMPARISON:  11/12/2016 FINDINGS: Cardiac shadow remains enlarged. Left dialysis catheter is noted. Lungs are well aerated bilaterally. Patchy chronic changes are again noted in the lateral right lung base. No new focal abnormality is seen. No bony abnormality is noted. IMPRESSION: Stable changes in the right lung base. Electronically Signed   By: Inez Catalina M.D.   On: 01/18/2017 20:36    EKG:   Orders placed or performed during the hospital encounter of 01/18/17  . EKG 12-Lead  . EKG 12-Lead  . ED EKG  . ED EKG    IMPRESSION AND PLAN:  Principal Problem:   Acute on chronic systolic CHF (congestive heart failure) ([place]) - patient has borderline blood pressure, and does not make any significant urine so we cannot use diuresis at this time. We will get a cardiology consult, as well as nephrology consult, see below. Patient's breathing is somewhat improved with supplemental oxygen. Active Problems:   Elevated troponin - trend cardiac enzymes tonight, other cardiac workup as above   HYPERTENSION, BENIGN - continue scheduled home meds, though we will hold them tonight given his borderline blood pressure   ESRD (end stage renal disease) on dialysis [facility]) - nephrology consult for dialysis support   CAD (coronary artery disease) - workup as above   Hyperlipidemia - home meds  All the records are reviewed and case discussed with ED provider. Management plans discussed with the patient and/or family.  DVT PROPHYLAXIS: Systemic anticoagulation  GI PROPHYLAXIS: PPI  ADMISSION  STATUS: Inpatient  CODE STATUS: Full Code Status History    Date Active Date Inactive Code Status Order ID Comments User Context   11/11/2016  4:21 AM 11/14/2016  7:08 PM Full Code [id number]  Marcille Blanco,  Norva Riffle, MD Inpatient   10/28/2016  7:56 AM 10/30/2016  7:30 PM Full Code [id number]  Demetrios Loll, MD ED   08/21/2016  4:08 AM 08/22/2016  6:26 PM DNR [id number]  Harrie Foreman, MD Inpatient   08/03/2016 11:19 PM 08/07/2016  7:28 PM DNR [id number]  Lance Coon, MD Inpatient   07/26/2016  5:07 PM 07/31/2016  9:58 PM DNR [id number]  Vaughan Basta, MD Inpatient   05/22/2016 12:34 AM 05/24/2016  7:11 PM Full Code [id number]  [place]el[place]yer, [place], DO Inpatient   01/13/2015  1:44 AM 01/14/2015  2:46 PM Full Code [id number]  Juluis Mire, MD ED   12/15/2014  9:32 AM 12/15/2014  2:31 PM Full Code [id number]  Algernon Huxley, MD Inpatient   05/06/2014  8:29 PM 05/10/2014  5:23 PM Full Code [id number]  Elam Dutch, MD Inpatient   05/04/2014 10:24 PM 05/06/2014  8:29 PM Full Code [id number]  Rise Patience, MD Inpatient      TOTAL TIME TAKING CARE OF THIS PATIENT: 45 minutes.   Guillermo Difrancesco Sherman 01/18/2017, 9:51 PM  [employer]  [phone]  CC: Primary care physician; [facility] [place]  Note:  This document was prepared using Systems analyst and may include unintentional dictation errors.

## 2017-01-19 LAB — PROTIME-INR
INR: 1.15
PROTHROMBIN TIME: 14.8 s (ref 11.4–15.2)

## 2017-01-19 LAB — CBC
HCT: 29 % — ABNORMAL LOW (ref 40.0–52.0)
HEMOGLOBIN: 9.5 g/dL — AB (ref 13.0–18.0)
MCH: 28.9 pg (ref 26.0–34.0)
MCHC: 32.7 g/dL (ref 32.0–36.0)
MCV: 88.5 fL (ref 80.0–100.0)
Platelets: 195 10*3/uL (ref 150–440)
RBC: 3.28 MIL/uL — AB (ref 4.40–5.90)
RDW: 18.4 % — ABNORMAL HIGH (ref 11.5–14.5)
WBC: 11.8 10*3/uL — AB (ref 3.8–10.6)

## 2017-01-19 LAB — MRSA PCR SCREENING: MRSA BY PCR: POSITIVE — AB

## 2017-01-19 LAB — TROPONIN I
TROPONIN I: 0.58 ng/mL — AB (ref <0.03)
Troponin I: 0.54 ng/mL (ref <0.03)
Troponin I: 0.54 ng/mL (ref <0.03)

## 2017-01-19 LAB — LACTIC ACID, PLASMA: Lactic Acid, Venous: 1 mmol/L (ref 0.5–1.9)

## 2017-01-19 LAB — BASIC METABOLIC PANEL
ANION GAP: 15 (ref 5–15)
BUN: 65 mg/dL — AB (ref 6–20)
CHLORIDE: 101 mmol/L (ref 101–111)
CO2: 20 mmol/L — ABNORMAL LOW (ref 22–32)
Calcium: 8.8 mg/dL — ABNORMAL LOW (ref 8.9–10.3)
Creatinine, Ser: 10.26 mg/dL — ABNORMAL HIGH (ref 0.61–1.24)
GFR calc Af Amer: 5 mL/min — ABNORMAL LOW (ref >60)
GFR, EST NON AFRICAN AMERICAN: 5 mL/min — AB (ref >60)
Glucose, Bld: 90 mg/dL (ref 65–99)
POTASSIUM: 4.5 mmol/L (ref 3.5–5.1)
SODIUM: 136 mmol/L (ref 135–145)

## 2017-01-19 LAB — HEPARIN LEVEL (UNFRACTIONATED): Heparin Unfractionated: 0.14 IU/mL — ABNORMAL LOW (ref 0.30–0.70)

## 2017-01-19 MED ORDER — DOCUSATE SODIUM 100 MG PO CAPS: 100.0000 mg | ORAL_CAPSULE | Freq: Two times a day (BID) | ORAL | Status: DC | PRN

## 2017-01-19 MED ORDER — CARVEDILOL 3.125 MG PO TABS
3.1250 mg | ORAL_TABLET | Freq: Two times a day (BID) | ORAL | Status: DC
  Administered 2017-01-19 – 2017-01-20 (×3): 3.125 mg via ORAL
  Filled 2017-01-19 (×5): qty 1

## 2017-01-19 MED ORDER — WARFARIN SODIUM 7.5 MG PO TABS
7.5000 mg | ORAL_TABLET | Freq: Once | ORAL | Status: AC
  Administered 2017-01-19: 7.5 mg via ORAL
  Filled 2017-01-19: qty 1

## 2017-01-19 MED ORDER — TIOTROPIUM BROMIDE MONOHYDRATE 18 MCG IN CAPS
18.0000 ug | ORAL_CAPSULE | Freq: Every day | RESPIRATORY_TRACT | Status: DC
  Administered 2017-01-19 – 2017-01-21 (×3): 18 ug via RESPIRATORY_TRACT
  Filled 2017-01-19: qty 5

## 2017-01-19 MED ORDER — MOMETASONE FURO-FORMOTEROL FUM 200-5 MCG/ACT IN AERO
2.0000 | INHALATION_SPRAY | Freq: Two times a day (BID) | RESPIRATORY_TRACT | Status: DC
  Administered 2017-01-19 – 2017-01-21 (×5): 2 via RESPIRATORY_TRACT
  Filled 2017-01-19: qty 8.8

## 2017-01-19 MED ORDER — HEPARIN (PORCINE) IN NACL 100-0.45 UNIT/ML-% IJ SOLN: 1600.0000 [IU]/h | INTRAMUSCULAR | Status: DC

## 2017-01-19 MED ORDER — ACETAMINOPHEN 650 MG RE SUPP: 650.0000 mg | Freq: Four times a day (QID) | RECTAL | Status: DC | PRN

## 2017-01-19 MED ORDER — HEPARIN SODIUM (PORCINE) 5000 UNIT/ML IJ SOLN
5000.0000 [IU] | Freq: Three times a day (TID) | INTRAMUSCULAR | Status: DC
  Administered 2017-01-19: 5000 [IU] via SUBCUTANEOUS
  Filled 2017-01-19: qty 1

## 2017-01-19 MED ORDER — CALCIUM ACETATE (PHOS BINDER) 667 MG PO CAPS
667.0000 mg | ORAL_CAPSULE | Freq: Three times a day (TID) | ORAL | Status: DC
  Administered 2017-01-19 – 2017-01-21 (×6): 667 mg via ORAL
  Filled 2017-01-19 (×7): qty 1

## 2017-01-19 MED ORDER — OXYCODONE-ACETAMINOPHEN 5-325 MG PO TABS: 1.0000 | ORAL_TABLET | Freq: Four times a day (QID) | ORAL | Status: DC | PRN

## 2017-01-19 MED ORDER — ORAL CARE MOUTH RINSE
15.0000 mL | Freq: Two times a day (BID) | OROMUCOSAL | Status: DC
  Administered 2017-01-19 – 2017-01-21 (×3): 15 mL via OROMUCOSAL

## 2017-01-19 MED ORDER — ASPIRIN 325 MG PO TABS
325.0000 mg | ORAL_TABLET | Freq: Every day | ORAL | Status: DC
  Administered 2017-01-19 – 2017-01-21 (×3): 325 mg via ORAL
  Filled 2017-01-19 (×3): qty 1

## 2017-01-19 MED ORDER — HEPARIN (PORCINE) IN NACL 100-0.45 UNIT/ML-% IJ SOLN
1500.0000 [IU]/h | INTRAMUSCULAR | Status: DC
  Administered 2017-01-19: 1050 [IU]/h via INTRAVENOUS
  Administered 2017-01-20: 1300 [IU]/h via INTRAVENOUS
  Filled 2017-01-19 (×4): qty 250

## 2017-01-19 MED ORDER — PREGABALIN 75 MG PO CAPS
75.0000 mg | ORAL_CAPSULE | Freq: Two times a day (BID) | ORAL | Status: DC
  Administered 2017-01-19 – 2017-01-21 (×5): 75 mg via ORAL
  Filled 2017-01-19 (×5): qty 1

## 2017-01-19 MED ORDER — CHLORHEXIDINE GLUCONATE CLOTH 2 % EX PADS
6.0000 | MEDICATED_PAD | Freq: Every day | CUTANEOUS | Status: DC
  Administered 2017-01-19 – 2017-01-20 (×2): 6 via TOPICAL

## 2017-01-19 MED ORDER — HEPARIN BOLUS VIA INFUSION
2500.0000 [IU] | Freq: Once | INTRAVENOUS | Status: AC
  Administered 2017-01-19: 2500 [IU] via INTRAVENOUS
  Filled 2017-01-19: qty 2500

## 2017-01-19 MED ORDER — GUAIFENESIN-DM 100-10 MG/5ML PO SYRP
5.0000 mL | ORAL_SOLUTION | ORAL | Status: DC | PRN
  Filled 2017-01-19: qty 5

## 2017-01-19 MED ORDER — MENTHOL 3 MG MT LOZG
1.0000 | LOZENGE | OROMUCOSAL | Status: DC | PRN
  Administered 2017-01-19 – 2017-01-20 (×3): 3 mg via ORAL
  Filled 2017-01-19 (×2): qty 9

## 2017-01-19 MED ORDER — ONDANSETRON HCL 4 MG PO TABS: 4.0000 mg | ORAL_TABLET | Freq: Four times a day (QID) | ORAL | Status: DC | PRN

## 2017-01-19 MED ORDER — ISOSORBIDE MONONITRATE ER 30 MG PO TB24
30.0000 mg | ORAL_TABLET | Freq: Every day | ORAL | Status: DC
  Administered 2017-01-19: 30 mg via ORAL
  Filled 2017-01-19: qty 1

## 2017-01-19 MED ORDER — HYDROCOD POLST-CPM POLST ER 10-8 MG/5ML PO SUER
5.0000 mL | Freq: Two times a day (BID) | ORAL | Status: DC
  Administered 2017-01-21: 5 mL via ORAL
  Filled 2017-01-19: qty 5

## 2017-01-19 MED ORDER — PANTOPRAZOLE SODIUM 20 MG PO TBEC: 20.0000 mg | DELAYED_RELEASE_TABLET | Freq: Every day | ORAL | Status: DC

## 2017-01-19 MED ORDER — ATORVASTATIN CALCIUM 20 MG PO TABS
10.0000 mg | ORAL_TABLET | Freq: Every day | ORAL | Status: DC
  Administered 2017-01-19 – 2017-01-20 (×2): 10 mg via ORAL
  Filled 2017-01-19 (×2): qty 1

## 2017-01-19 MED ORDER — PANTOPRAZOLE SODIUM 40 MG PO TBEC
40.0000 mg | DELAYED_RELEASE_TABLET | Freq: Every day | ORAL | Status: DC
  Administered 2017-01-19 – 2017-01-21 (×2): 40 mg via ORAL
  Filled 2017-01-19 (×2): qty 1

## 2017-01-19 MED ORDER — IPRATROPIUM-ALBUTEROL 0.5-2.5 (3) MG/3ML IN SOLN
3.0000 mL | RESPIRATORY_TRACT | Status: DC | PRN
  Filled 2017-01-19: qty 3

## 2017-01-19 MED ORDER — ONDANSETRON HCL 4 MG/2ML IJ SOLN: 4.0000 mg | Freq: Four times a day (QID) | INTRAMUSCULAR | Status: DC | PRN

## 2017-01-19 MED ORDER — ACETAMINOPHEN 325 MG PO TABS: 650.0000 mg | ORAL_TABLET | Freq: Four times a day (QID) | ORAL | Status: DC | PRN

## 2017-01-19 MED ORDER — IPRATROPIUM-ALBUTEROL 20-100 MCG/ACT IN AERS: 1.0000 | INHALATION_SPRAY | Freq: Four times a day (QID) | RESPIRATORY_TRACT | Status: DC | PRN

## 2017-01-19 MED ORDER — SEVELAMER CARBONATE 800 MG PO TABS
800.0000 mg | ORAL_TABLET | Freq: Every day | ORAL | Status: DC
  Administered 2017-01-19 – 2017-01-21 (×3): 800 mg via ORAL
  Filled 2017-01-19 (×3): qty 1

## 2017-01-19 MED ORDER — MUPIROCIN 2 % EX OINT
1.0000 "application " | TOPICAL_OINTMENT | Freq: Two times a day (BID) | CUTANEOUS | Status: DC
  Administered 2017-01-19 – 2017-01-21 (×5): 1 via NASAL
  Filled 2017-01-19 (×2): qty 22

## 2017-01-19 MED ORDER — LORATADINE 10 MG PO TABS
10.0000 mg | ORAL_TABLET | Freq: Every day | ORAL | Status: DC
  Administered 2017-01-19 – 2017-01-21 (×3): 10 mg via ORAL
  Filled 2017-01-19 (×3): qty 1

## 2017-01-19 NOTE — Progress Notes (Signed)
[place] [facility] at [place] NAME: [name]    MR#:  [id number]  DATE OF BIRTH:  02/12/1956  SUBJECTIVE:  CHIEF COMPLAINT:   Chief Complaint  Patient presents with  . Shortness of Breath   - Patient with chronic dyspnea, came in with worsening cough, hoarseness of his voice. Worsening shortness of breath.  REVIEW OF SYSTEMS:  Review of Systems  Constitutional: Positive for malaise/fatigue. Negative for chills and fever.  HENT: Negative for congestion, ear discharge, hearing loss and nosebleeds.   Eyes: Negative for blurred vision and double vision.  Respiratory: Positive for cough, sputum production and shortness of breath. Negative for wheezing.   Cardiovascular: Negative for chest pain, palpitations and leg swelling.  Gastrointestinal: Negative for abdominal pain, constipation, diarrhea, nausea and vomiting.  Genitourinary: Negative for dysuria.  Musculoskeletal: Negative for myalgias.  Neurological: Negative for dizziness, speech change, focal weakness, seizures and headaches.  Psychiatric/Behavioral: Negative for depression.    DRUG ALLERGIES:   Allergies  Allergen Reactions  . Dust Mite Extract Other (See Comments)    Reaction: unknown    VITALS:  Blood pressure 122/66, pulse 99, temperature 98.7 F (37.1 C), temperature source Oral, resp. rate 18, height 6\' 2"  (1.88 m), weight 85.4 kg (188 lb 3.2 oz), SpO2 100 %.  PHYSICAL EXAMINATION:  Physical Exam  GENERAL:  61 y.o.-year-old patient lying in the bed with no acute distress.  EYES: Pupils equal, round, reactive to light and accommodation. No scleral icterus. Extraocular muscles intact.  HEENT: Head atraumatic, normocephalic. Oropharynx and nasopharynx clear.  NECK:  Supple, no jugular venous distention. No thyroid enlargement, no tenderness.  LUNGS: Normal breath sounds bilaterally, no wheezing, rhonchi or crepitation. No use of accessory muscles of respiration. Fine  bibasilar crackles, appears tachypneic on minimal exertion CARDIOVASCULAR: S1, S2 normal. No rubs, or gallops. 2/6 systolic murmur is present ABDOMEN: Soft, nontender, nondistended. Bowel sounds present. No organomegaly or mass.  EXTREMITIES: Status post left BKA, No pedal edema, cyanosis, or clubbing.  NEUROLOGIC: Cranial nerves II through XII are intact. Muscle strength 5/5 in all extremities. Sensation intact. Gait not checked.  PSYCHIATRIC: The patient is alert and oriented x 3.  SKIN: No obvious rash, lesion, or ulcer.    LABORATORY PANEL:   CBC  Recent Labs Lab 01/19/17 0549  WBC 11.8*  HGB 9.5*  HCT 29.0*  PLT 195   ------------------------------------------------------------------------------------------------------------------  Chemistries   Recent Labs Lab 01/18/17 2002 01/19/17 0549  NA 138 136  K 4.3 4.5  CL 100* 101  CO2 22 20*  GLUCOSE 113* 90  BUN 56* 65*  CREATININE 9.02* 10.26*  CALCIUM 9.4 8.8*  AST 49*  --   ALT 32  --   ALKPHOS 100  --   BILITOT 1.0  --    ------------------------------------------------------------------------------------------------------------------  Cardiac Enzymes  Recent Labs Lab 01/19/17 0549  TROPONINI 0.58*   ------------------------------------------------------------------------------------------------------------------  RADIOLOGY:  Dg Chest 2 View  Result Date: 01/18/2017 CLINICAL DATA:  Shortness of Breath EXAM: CHEST  2 VIEW COMPARISON:  11/12/2016 FINDINGS: Cardiac shadow remains enlarged. Left dialysis catheter is noted. Lungs are well aerated bilaterally. Patchy chronic changes are again noted in the lateral right lung base. No new focal abnormality is seen. No bony abnormality is noted. IMPRESSION: Stable changes in the right lung base. Electronically Signed   By: Inez Catalina M.D.   On: 01/18/2017 20:36    EKG:   Orders placed or performed during the hospital encounter of  01/18/17  . EKG 12-Lead  .  EKG 12-Lead  . ED EKG  . ED EKG    ASSESSMENT AND PLAN:   61 year old male with past medical history significant for chronic systolic CHF with EF of 68%, COPD, CAD status post stents, end-stage renal disease on Monday, Wednesday and Friday hemodialysis, asthma, neuropathy and amputation of left below knee presents to hospital secondary to worsening shortness of breath and cough.  #1 acute on chronic systolic CHF exacerbation-chest x-ray was not impressive but patient clinically has cough and bibasilar crackles on exam. -Does not make much urine, will need dialysis. Nephrology consulted. -Strict input and output monitoring, daily weights and low salt diet.  #2 cough and hoarseness of voice-upper respiratory infection with laryngitis -Add antihistamines, cough medicines and monitor  -will hold off on antibiotics at this time.  #3 CAD status post stents-troponins are stable, less likely to be an acute MI. Secondary to demand ischemia -Continue isosorbide if blood pressure is able to tolerate. Appreciate cardiology consult. -on aspirin and Coreg  #4 end-stage renal disease-on Monday, Wednesday and Friday hemodialysis. -Follows with [facility] nephrology. Consult nephrology for dialysis in the hospital.  #5 pulmonary emboli-diagnosed with right upper lobe pulmonary emboli in March 2018, on Coumadin but INR is subtherapeutic. -with his chronic dyspnea symptoms, questionable noncompliance -We'll start heparin to bridge and continue Coumadin until INR is therapeutic  #6 DVT Prophylaxis- heparin drip   All the records are reviewed and case discussed with Care Management/Social Workerr. Management plans discussed with the patient, family and they are in agreement.  CODE STATUS: Full code  TOTAL TIME TAKING CARE OF THIS PATIENT: 37 minutes.   POSSIBLE D/C IN 2-3 DAYS, DEPENDING ON CLINICAL CONDITION.   Geremy Rister M.D on 01/19/2017 at 8:51 AM  Between 7am to 6pm - Pager -  [phone]  After 6pm go to [email or web address] - password [email or web address] [facility]  Office  [phone]  CC: Primary care physician; [facility] [place]

## 2017-01-19 NOTE — Progress Notes (Signed)
ANTICOAGULATION CONSULT NOTE - Initial Consult  Pharmacy Consult for warfarin dosing Indication: VTE treatment  Allergies  Allergen Reactions  . Dust Mite Extract Other (See Comments)    Reaction: unknown    Patient Measurements: Height: 6\' 2"  (188 cm) Weight: 188 lb 3.2 oz (85.4 kg) IBW/kg (Calculated) : 82.2 Heparin Dosing Weight: n/a  Vital Signs: Temp: 97.8 F (36.6 C) (08/26 0446) Temp Source: Oral (08/26 0446) BP: 118/83 (08/26 0446) Pulse Rate: 97 (08/26 0446)  Labs:  Recent Labs  01/18/17 2002 01/18/17 2337 01/19/17 0549  HGB 10.4*  --  9.5*  HCT 31.9*  --  29.0*  PLT 213  --  195  LABPROT  --   --  14.8  INR  --   --  1.15  CREATININE 9.02*  --   --   TROPONINI 0.54* 0.54*  --     Estimated Creatinine Clearance: 9.9 mL/min (A) (by C-G formula based on SCr of 9.02 mg/dL (H)).   Medical History: Past Medical History:  Diagnosis Date  . 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 Amputee, below knee, left ([place])   . Anemia   . Asthma   . Cardiomyopathy ([place])   . CHF (congestive heart failure) ([place])   . Chronic systolic heart failure ([place])   . Complication of anesthesia    hypotension  . COPD (chronic obstructive pulmonary disease) ([place])   . Coronary artery disease   . Dialysis patient ([place])    Mon, Wed, Fri  . End stage renal disease ([place])   . GERD (gastroesophageal reflux disease)   . Headache   . History of kidney stones   . History of pulmonary embolism   . HLD (hyperlipidemia)   . HTN (hypertension)   . Hyperparathyroidism   . Myocardial infarction ([place])   . Peripheral vascular disease ([place])   . Shortness of breath dyspnea   . Sleep apnea    NO C-PAP, Patient stated in process of  "getting one"   . Tobacco dependence     Medications:  Home meds include warfarin 2.5 and 3 mg. Unsure of home regimen. Med rec tech to follow up with patient's pharmacy.  Assessment: INR subtherapeutic on admission  Goal of Therapy:  INR  2-3    Plan:  Since patient's INR is close to 1 and home regimen/compliance unknown will start at 7.5 mg x1 per score card nomogram. F/u INR with Monday AM labs.  Harriet Bollen S 01/19/2017,6:32 AM

## 2017-01-19 NOTE — Progress Notes (Addendum)
ANTICOAGULATION CONSULT NOTE - Initial Consult  Pharmacy Consult for heparin Indication: pulmonary embolus  Allergies  Allergen Reactions  . Dust Mite Extract Other (See Comments)    Reaction: unknown    Patient Measurements: Height: 6\' 2"  (188 cm) Weight: 188 lb 3.2 oz (85.4 kg) IBW/kg (Calculated) : 82.2 Heparin Dosing Weight: 85 kg   Vital Signs: Temp: 98.7 F (37.1 C) (08/26 0849) Temp Source: Oral (08/26 0849) BP: 122/66 (08/26 0849) Pulse Rate: 99 (08/26 0849)  Labs:  Recent Labs  01/18/17 2002 01/18/17 2337 01/19/17 0549  HGB 10.4*  --  9.5*  HCT 31.9*  --  29.0*  PLT 213  --  195  LABPROT  --   --  14.8  INR  --   --  1.15  CREATININE 9.02*  --  10.26*  TROPONINI 0.54* 0.54* 0.58*    Estimated Creatinine Clearance: 8.7 mL/min (A) (by C-G formula based on SCr of 10.26 mg/dL (H)).   Medical History: Past Medical History:  Diagnosis Date  . 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 Amputee, below knee, left ([place])   . Anemia   . Asthma   . Cardiomyopathy ([place])   . CHF (congestive heart failure) ([place])   . Chronic systolic heart failure ([place])   . Complication of anesthesia    hypotension  . COPD (chronic obstructive pulmonary disease) ([place])   . Coronary artery disease   . Dialysis patient ([place])    Mon, Wed, Fri  . End stage renal disease ([place])   . GERD (gastroesophageal reflux disease)   . Headache   . History of kidney stones   . History of pulmonary embolism   . HLD (hyperlipidemia)   . HTN (hypertension)   . Hyperparathyroidism   . Myocardial infarction ([place])   . Peripheral vascular disease ([place])   . Shortness of breath dyspnea   . Sleep apnea    NO C-PAP, Patient stated in process of  "getting one"   . Tobacco dependence     Assessment: 61 year old male on warfarin for PE. INR subtherapeutic and will bridge with heparin until INR at goal.    Goal of Therapy: INR 2-3   Heparin level 0.3-0.7 units/ml Monitor platelets by  anticoagulation protocol: Yes   Plan:  Per provider, no need to bolus.  Will start heparin drip at 1050 units/hr. Will check an 8-hour heparin level today.  Will check daily HL and CBC while on heparin.    [place] Resident  01/19/2017,10:03 AM

## 2017-01-19 NOTE — ED Notes (Signed)
Called Orthoptist to ask if she could see any missing orders for admission process. Amanda calling bed control at this time.

## 2017-01-19 NOTE — Progress Notes (Signed)
[facility], [place] 01/19/17  Subjective:   Patient known to our practice from previous admissions. He presents for unresolving cough for over a month. When he got worse, he decided to get it checked out His last routine dialysis was on Friday At present he denies acute shortness of breath. He is requiring oxygen supplementation with nasal cannula Appetite is okay. No nausea or vomiting. No leg edema.  Objective:  Vital signs in last 24 hours:  Temp:  [97.8 F (36.6 C)-99.9 F (37.7 C)] 98.7 F (37.1 C) (08/26 0849) Pulse Rate:  [97-106] 99 (08/26 0849) Resp:  [18-33] 18 (08/26 0446) BP: (105-122)/(65-90) 122/66 (08/26 0849) SpO2:  [96 %-100 %] 100 % (08/26 0849) Weight:  [84.9 kg (187 lb 3.2 oz)-98 kg (216 lb)] 84.9 kg (187 lb 3.2 oz) (08/26 1053)  Weight change:  Filed Weights   01/19/17 0147 01/19/17 0446 01/19/17 1053  Weight: 85.5 kg (188 lb 9.6 oz) 85.4 kg (188 lb 3.2 oz) 84.9 kg (187 lb 3.2 oz)    Intake/Output:    Intake/Output Summary (Last 24 hours) at 01/19/17 1137 Last data filed at 01/19/17 0446  Gross per 24 hour  Intake                0 ml  Output                0 ml  Net                0 ml    Physical Exam: General: No acute distress   Head: Normocephalic, atraumatic. Moist oral mucosal membranes  Eyes: Anicteric  Neck: Supple,    Lungs:  [place] O2,No crackles heard   Heart: S1S2 no rubs  Abdomen:  Soft, nontender,    Extremities: Left BKA, right transmetatarsal amputation   Neurologic: Awake, alert, following commands  Skin: No lesions  Access: Left IJ PermCath       Basic Metabolic Panel:   Recent Labs Lab 01/18/17 2002 01/19/17 0549  NA 138 136  K 4.3 4.5  CL 100* 101  CO2 22 20*  GLUCOSE 113* 90  BUN 56* 65*  CREATININE 9.02* 10.26*  CALCIUM 9.4 8.8*     CBC:  Recent Labs Lab 01/18/17 2002 01/19/17 0549  WBC 10.6 11.8*  NEUTROABS 7.4*  --   HGB 10.4* 9.5*  HCT 31.9* 29.0*  MCV 88.4 88.5   PLT 213 195     Lab Results  Component Value Date   HEPBSAG Negative 10/28/2016   HEPBSAB Non Reactive 05/22/2016      Microbiology:  Recent Results (from the past 240 hour(s))  MRSA PCR Screening     Status: Abnormal   Collection Time: 01/19/17  1:47 AM  Result Value Ref Range Status   MRSA by PCR POSITIVE (A) NEGATIVE Final    Comment:        The GeneXpert MRSA Assay (FDA approved for NASAL specimens only), is one component of a comprehensive MRSA colonization surveillance program. It is not intended to diagnose MRSA infection nor to guide or monitor treatment for MRSA infections. RESULT CALLED TO, READ BACK BY AND VERIFIED WITH: TAKEIA NESETT ON 01/19/17 AT 0316 BY JAG     Coagulation Studies:  Recent Labs  01/19/17 0549  LABPROT 14.8  INR 1.15    Urinalysis: No results for input(s): COLORURINE, LABSPEC, PHURINE, GLUCOSEU, HGBUR, BILIRUBINUR, KETONESUR, PROTEINUR, UROBILINOGEN, NITRITE, LEUKOCYTESUR in the last 72 hours.  Invalid input(s): APPERANCEUR  Imaging: Dg Chest 2 View  Result Date: 01/18/2017 CLINICAL DATA:  Shortness of Breath EXAM: CHEST  2 VIEW COMPARISON:  11/12/2016 FINDINGS: Cardiac shadow remains enlarged. Left dialysis catheter is noted. Lungs are well aerated bilaterally. Patchy chronic changes are again noted in the lateral right lung base. No new focal abnormality is seen. No bony abnormality is noted. IMPRESSION: Stable changes in the right lung base. Electronically Signed   By: Inez Catalina M.D.   On: 01/18/2017 20:36     Medications:   . heparin 1,050 Units/hr (01/19/17 1117)   . aspirin  325 mg Oral Daily  . atorvastatin  10 mg Oral QHS  . calcium acetate  667 mg Oral TID WC  . carvedilol  3.125 mg Oral BID WC  . Chlorhexidine Gluconate Cloth  6 each Topical Q0600  . chlorpheniramine-HYDROcodone  5 mL Oral Q12H  . isosorbide mononitrate  30 mg Oral Daily  . loratadine  10 mg Oral Daily  . mouth rinse  15 mL Mouth Rinse  BID  . mometasone-formoterol  2 puff Inhalation BID  . mupirocin ointment  1 application Nasal BID  . pantoprazole  40 mg Oral Daily  . pregabalin  75 mg Oral BID  . sevelamer carbonate  800 mg Oral Daily  . tiotropium  18 mcg Inhalation Daily  . warfarin  7.5 mg Oral ONCE-1800   acetaminophen **OR** acetaminophen, docusate sodium, ipratropium-albuterol, menthol-cetylpyridinium, ondansetron **OR** ondansetron (ZOFRAN) IV, oxyCODONE-acetaminophen  Assessment/ Plan:  61 y.o. male with ESRD on hemodialysis, hypertension, hyperlipidemia, anemia, COPD, peripheral vascular disease, left BKA, right toe amputations, Pulm Embolism 07/2016 requiring Warfarin  MWF [facility] [place] [facility].   1.End stage renal disease   - Dialysis planned for Monday. - Electrolytes and volume status are acceptable. No acute indication for dialysis at present.  2. Anemia of chronic kidney disease:  Low-dose Epogen with dialysis. Hemoglobin 9.5  3.  Shortness of breath - Bronchitis vs Pneumonia versus COPD exacerbation - Not on antibiotics at present  4. Secondary Hyperparathyroidism:  Monitor Phos Calcium acetate and Sevelamer   LOS: [place] 8/26/201811:37 [place]ell [place], [place]

## 2017-01-19 NOTE — ED Notes (Signed)
Pt transport to 249

## 2017-01-19 NOTE — Consult Note (Signed)
[facility] Cardiology Consultation Note  Patient ID: [name], MRN: [id number], DOB/AGE: Sep 16, 1955 61 y.o. Admit date: 01/18/2017   Date of Consult: 01/19/2017 Primary Physician: [place], [place] Primary Cardiologist: Nehemiah Massed  Chief Complaint:  Chief Complaint  Patient presents with  . Shortness of Breath   Reason for Consult: cough with shortness of breath  HPI: 61 y.o. male with known severe LV systolic dysfunction with ejection fraction of 20% dilated cardiomyopathy chronic obstructive pulmonary disease coronary artery disease essential hypertension makes hyperlipidemia with acute on chronic systolic dysfunction congestive heart failure with severe shortness of breath lower extremity edema and pulmonary edema. The patient has been using appropriate medication management for chronic systolic dysfunction heart failure for which includes isosorbide atorvastatin. Additionally the patient has gotten regular dialysis for which appears to be working relatively well but there is concerns that he has had some fluid retention causing this cough and congestion. He does have an elevation of troponin consistent with kidney disease rather than acute coronary syndrome. There is been no significant improvements with current treatment of oxygen and may need further intervention including dialysis   Past Medical History:  Diagnosis Date  . 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 Amputee, below knee, left ([place])   . Anemia   . Asthma   . Cardiomyopathy ([place])   . CHF (congestive heart failure) ([place])   . Chronic systolic heart failure ([place])   . Complication of anesthesia    hypotension  . COPD (chronic obstructive pulmonary disease) ([place])   . Coronary artery disease   . Dialysis patient ([place])    Mon, Wed, Fri  . End stage renal disease ([place])   . GERD (gastroesophageal reflux disease)   . Headache   . History of kidney stones   . History of pulmonary  embolism   . HLD (hyperlipidemia)   . HTN (hypertension)   . Hyperparathyroidism   . Myocardial infarction ([place])   . Peripheral vascular disease ([place])   . Shortness of breath dyspnea   . Sleep apnea    NO C-PAP, Patient stated in process of  "getting one"   . Tobacco dependence       Surgical History:  Past Surgical History:  Procedure Laterality Date  . AMPUTATION Left 05/06/2014   Procedure: AMPUTATION BELOW KNEE;  Surgeon: Elam Dutch, MD;  Location: [place];  Service: Vascular;  Laterality: Left;  . AMPUTATION Right 01/12/2015   Procedure: Foot transmetatarsal amputation;  Surgeon: Algernon Huxley, MD;  Location: [facility];  Service: Vascular;  Laterality: Right;  . APPLICATION OF WOUND VAC Right 03/01/2015   Procedure: Application of Bio-connekt graft and wound vac application to right foot ;  Surgeon: Algernon Huxley, MD;  Location: [facility];  Service: Vascular;  Laterality: Right;  . AV FISTULA PLACEMENT Left   . AV FISTULA PLACEMENT Left 11/28/2016   Procedure: ARTERIOVENOUS (AV) FISTULA CREATION;  Surgeon: Algernon Huxley, MD;  Location: [facility];  Service: Vascular;  Laterality: Left;  . CARDIAC CATHETERIZATION     stent placement   . CORONARY ANGIOPLASTY    . DIALYSIS/PERMA CATHETER INSERTION N/A 07/31/2016   Procedure: Dialysis/Perma Catheter Insertion;  Surgeon: Algernon Huxley, MD;  Location: [place] [facility];  Service: Cardiovascular;  Laterality: N/A;  . LIGATION OF ARTERIOVENOUS  FISTULA Right 01/31/2016   Procedure: LIGATION OF ARTERIOVENOUS  FISTULA;  Surgeon: Algernon Huxley, MD;  Location: [facility];  Service: Vascular;  Laterality: Right;  . PERIPHERAL VASCULAR CATHETERIZATION Right 12/15/2014   Procedure: Lower Extremity Angiography;  Surgeon: Algernon Huxley, MD;  Location: [place] [facility];  Service: Cardiovascular;  Laterality: Right;  . PERIPHERAL VASCULAR CATHETERIZATION  12/15/2014   Procedure: Lower Extremity Intervention;  Surgeon: Algernon Huxley, MD;  Location: [place] [facility]  [facility];  Service: Cardiovascular;;  . PERIPHERAL VASCULAR CATHETERIZATION Right 08/14/2015   Procedure: A/V Shuntogram/Fistulagram;  Surgeon: Algernon Huxley, MD;  Location: [place] [facility] LAB;  Service: Cardiovascular;  Laterality: Right;  . PERIPHERAL VASCULAR CATHETERIZATION N/A 08/14/2015   Procedure: A/V Shunt Intervention;  Surgeon: Algernon Huxley, MD;  Location: [place] [facility];  Service: Cardiovascular;  Laterality: N/A;  . PERIPHERAL VASCULAR CATHETERIZATION N/A 01/11/2016   Procedure: Dialysis/Perma Catheter Insertion;  Surgeon: Algernon Huxley, MD;  Location: [place] [facility];  Service: Cardiovascular;  Laterality: N/A;  . REVISON OF ARTERIOVENOUS FISTULA Right 02/17/2016   Procedure: removal of AV fistula;  Surgeon: Serafina Mitchell, MD;  Location: [facility];  Service: Vascular;  Laterality: Right;  . REVISON OF ARTERIOVENOUS FISTULA Right 01/31/2016   Procedure: REVISON OF ARTERIOVENOUS FISTULA ( BRACHIOCEPHALIC ) W/ ARTEGRAFT;  Surgeon: Algernon Huxley, MD;  Location: [facility];  Service: Vascular;  Laterality: Right;  . TRANSMETATARSAL AMPUTATION Right 05/04/2015   Procedure: TRANSMETATARSAL AMPUTATION REVISION, great toe amputation;  Surgeon: Algernon Huxley, MD;  Location: [facility];  Service: Vascular;  Laterality: Right;     Home Meds: Prior to Admission medications   Medication Sig Start Date End Date Taking? Authorizing Provider  acetaminophen (TYLENOL) 500 MG tablet Take 1,000 mg by mouth every 6 (six) hours as needed (discomfort).    Yes [provider]  aspirin 325 MG tablet Take 325 mg by mouth daily.    Yes [provider]  atorvastatin (LIPITOR) 10 MG tablet Take 10 mg by mouth at bedtime.    Yes [provider]  calcium acetate (PHOSLO) 667 MG capsule Take 667 mg by mouth 3 (three) times daily with meals.   Yes [provider]  cetirizine (ZYRTEC) 10 MG tablet Take 10 mg by mouth daily.   Yes [provider]  docusate sodium (COLACE) 100 MG  capsule Take 100 mg by mouth 2 (two) times daily as needed for mild constipation.    Yes [provider]  HYDROcodone-acetaminophen (NORCO/VICODIN) 5-325 MG tablet Take 1 tablet by mouth every 6 (six) hours as needed for severe pain.   Yes [provider]  isosorbide mononitrate (IMDUR) 30 MG 24 hr tablet Take 1 tablet (30 mg total) by mouth daily. Patient taking differently: Take 15 mg by mouth daily.  08/22/16  Yes Demetrios Loll, MD  midodrine (PROAMATINE) 10 MG tablet Take 5 mg by mouth 2 (two) times daily.   Yes [provider]  Multiple Vitamin (MULTIVITAMIN WITH MINERALS) TABS tablet Take 1 tablet by mouth 2 (two) times daily.   Yes [provider]  oxyCODONE (OXY IR/ROXICODONE) 5 MG immediate release tablet Take 5 mg by mouth every 8 (eight) hours as needed for severe pain.   Yes [provider]  pregabalin (LYRICA) 75 MG capsule Take 75 mg by mouth 2 (two) times daily.   Yes [provider]  sevelamer carbonate (RENVELA) 800 MG tablet Take 800 mg by mouth daily.    Yes [provider]  budesonide-formoterol (SYMBICORT) 160-4.5 MCG/ACT inhaler Inhale 2 puffs into the lungs 2 (two) times daily. Reported on 08/14/2015  [provider]  fluticasone (FLONASE) 50 MCG/ACT nasal spray Place 1 spray into both nostrils daily.    [provider]  Ipratropium-Albuterol (COMBIVENT RESPIMAT) 20-100 MCG/ACT AERS respimat Inhale 1 puff into the lungs every 6 (six) hours as needed for wheezing or shortness of breath.    [provider]  ipratropium-albuterol (DUONEB) 0.5-2.5 (3) MG/3ML SOLN Take 3 mLs by nebulization every 4 (four) hours as needed (SOB).    [provider]  nitroGLYCERIN (NITROSTAT) 0.4 MG SL tablet Place 0.4 mg under the tongue every 5 (five) minutes as needed for chest pain.    [provider]  Nutritional Supplements (FEEDING SUPPLEMENT, NEPRO CARB STEADY,) LIQD Take 237 mLs by mouth  daily.    [provider]  pantoprazole (PROTONIX) 20 MG tablet Take 20 mg by mouth daily.    [provider]  polyethylene glycol (MIRALAX / GLYCOLAX) packet Take 17 g by mouth daily as needed for moderate constipation. Hold for loose stools.     [provider]  tiotropium (SPIRIVA) 18 MCG inhalation capsule Place 18 mcg into inhaler and inhale daily.    [provider]  warfarin (COUMADIN) 2.5 MG tablet Take 2.5 mg by mouth daily.     [provider]  warfarin (COUMADIN) 3 MG tablet Take 3 mg by mouth daily.    [provider]    Inpatient Medications:  . aspirin  325 mg Oral Daily  . atorvastatin  10 mg Oral QHS  . calcium acetate  667 mg Oral TID WC  . Chlorhexidine Gluconate Cloth  6 each Topical Q0600  . heparin  5,000 Units Subcutaneous Q8H  . isosorbide mononitrate  30 mg Oral Daily  . mouth rinse  15 mL Mouth Rinse BID  . mometasone-formoterol  2 puff Inhalation BID  . mupirocin ointment  1 application Nasal BID  . pantoprazole  40 mg Oral Daily  . pregabalin  75 mg Oral BID  . sevelamer carbonate  800 mg Oral Daily  . tiotropium  18 mcg Inhalation Daily  . warfarin  7.5 mg Oral ONCE-1800     Allergies:  Allergies  Allergen Reactions  . Dust Mite Extract Other (See Comments)    Reaction: unknown    Social History   Social History  . Marital status: Widowed    Spouse name: N/A  . Number of children: N/A  . Years of education: N/A   Occupational History  . Not on file.   Social History Main Topics  . Smoking status: Light Tobacco Smoker    Packs/day: 0.25    Types: Cigarettes  . Smokeless tobacco: Never Used     Comment: 5  . Alcohol use No  . Drug use: No     Comment: has used crack cocaine in past   . Sexual activity: Not Currently    Birth control/ protection: Abstinence   Other Topics Concern  . Not on file   Social History Narrative   Engaged.      Family History  Problem Relation Age of  Onset  . Heart failure Other   . Hypertension Other   . Leukemia Other   . Diabetes Other      Review of Systems Positive for Shortness of breath cough congestion Negative for: General:  chills, fever, night sweats or weight changes.  Cardiovascular: PND orthopnea syncope dizziness  Dermatological skin lesions rashes Respiratory: Positive for Cough congestion Urologic: Frequent urination urination at night and hematuria Abdominal: negative for  nausea, vomiting, diarrhea, bright red blood per rectum, melena, or hematemesis Neurologic: negative for visual changes, and/or hearing changes  All other systems reviewed and are otherwise negative except as noted above.  Labs:  Recent Labs  01/18/17 2002 01/18/17 2337 01/19/17 0549  TROPONINI 0.54* 0.54* 0.58*   Lab Results  Component Value Date   WBC 11.8 (H) 01/19/2017   HGB 9.5 (L) 01/19/2017   HCT 29.0 (L) 01/19/2017   MCV 88.5 01/19/2017   PLT 195 01/19/2017    Recent Labs Lab 01/18/17 2002 01/19/17 0549  NA 138 136  K 4.3 4.5  CL 100* 101  CO2 22 20*  BUN 56* 65*  CREATININE 9.02* 10.26*  CALCIUM 9.4 8.8*  PROT 8.0  --   BILITOT 1.0  --   ALKPHOS 100  --   ALT 32  --   AST 49*  --   GLUCOSE 113* 90   Lab Results  Component Value Date   CHOL 131 07/26/2016   HDL 45 07/26/2016   LDLCALC 47 07/26/2016   TRIG 196 (H) 07/26/2016   No results found for: DDIMER  Radiology/Studies:  Dg Chest 2 View  Result Date: 01/18/2017 CLINICAL DATA:  Shortness of Breath EXAM: CHEST  2 VIEW COMPARISON:  11/12/2016 FINDINGS: Cardiac shadow remains enlarged. Left dialysis catheter is noted. Lungs are well aerated bilaterally. Patchy chronic changes are again noted in the lateral right lung base. No new focal abnormality is seen. No bony abnormality is noted. IMPRESSION: Stable changes in the right lung base. Electronically Signed   By: Inez Catalina M.D.   On: 01/18/2017 20:36    EKG: Normal sinus rhythm  Weights: Filed  Weights   01/18/17 1942 01/19/17 0147 01/19/17 0446  Weight: 98 kg (216 lb) 85.5 kg (188 lb 9.6 oz) 85.4 kg (188 lb 3.2 oz)     Physical Exam: Blood pressure 118/83, pulse 97, temperature 97.8 F (36.6 C), temperature source Oral, resp. rate 18, height 6\' 2"  (1.88 m), weight 85.4 kg (188 lb 3.2 oz), SpO2 100 %. Body mass index is 24.16 kg/m. General: Well developed, well nourished, in no acute distress. Head eyes ears nose throat: Normocephalic, atraumatic, sclera non-icteric, no xanthomas, nares are without discharge. No apparent thyromegaly and/or mass  Lungs: Normal respiratory effort.  no wheezes, Basilar rales, no rhonchi.  Heart: RRR with normal S1 S2. no murmur gallop, no rub, PMI is normal size and placement, carotid upstroke normal without bruit, jugular venous pressure is normal Abdomen: Soft, non-tender, non-distended with normoactive bowel sounds. No hepatomegaly. No rebound/guarding. No obvious abdominal masses. Abdominal aorta is normal size without bruit Extremities: 1+ edema. no cyanosis, no clubbing, no ulcers  Peripheral : 2+ bilateral upper extremity pulses, 2+ bilateral femoral pulses, 2+ bilateral dorsal pedal pulse Neuro: Alert and oriented. No facial asymmetry. No focal deficit. Moves all extremities spontaneously. Musculoskeletal: Normal muscle tone without kyphosis Psych:  Responds to questions appropriately with a normal affect.    Assessment: 61 year old male with acute on chronic systolic dysfunction congestive heart failure multifactorial in nature including hypertension hyperlipidemia and chronic kidney disease on dialysis without evidence of myocardial infarction  Plan: 1. Continue oxygenation for hypoxia and congestive heart failure 2. Isosorbide for venous capacitance to improve chest pressure and shortness of breath 3. Dialysis for pulmonary edema and fluid overload causing congestive heart failure 4. Further cardiac interventions at this time due to no  evidence of myocardial infarction  Signed, Corey Skains M.D. [facility]  01/19/2017, 8:33 AM

## 2017-01-19 NOTE — Progress Notes (Addendum)
ANTICOAGULATION CONSULT NOTE - Initial Consult  Pharmacy Consult for heparin Indication: pulmonary embolus  Allergies  Allergen Reactions  . Dust Mite Extract Other (See Comments)    Reaction: unknown    Patient Measurements: Height: 6\' 2"  (188 cm) Weight: 187 lb 3.2 oz (84.9 kg) IBW/kg (Calculated) : 82.2 Heparin Dosing Weight: 85 kg   Vital Signs: Temp: 98.7 F (37.1 C) (08/26 0849) Temp Source: Oral (08/26 0849) BP: 122/66 (08/26 0849) Pulse Rate: 99 (08/26 0849)  Labs:  Recent Labs  01/18/17 2002 01/18/17 2337 01/19/17 0549 01/19/17 1118 01/19/17 1814  HGB 10.4*  --  9.5*  --   --   HCT 31.9*  --  29.0*  --   --   PLT 213  --  195  --   --   LABPROT  --   --  14.8  --   --   INR  --   --  1.15  --   --   HEPARINUNFRC  --   --   --   --  0.14*  CREATININE 9.02*  --  10.26*  --   --   TROPONINI 0.54* 0.54* 0.58* 0.54*  --     Estimated Creatinine Clearance: 8.7 mL/min (A) (by C-G formula based on SCr of 10.26 mg/dL (H)).   Medical History: Past Medical History:  Diagnosis Date  . 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 Amputee, below knee, left ([place])   . Anemia   . Asthma   . Cardiomyopathy ([place])   . CHF (congestive heart failure) ([place])   . Chronic systolic heart failure ([place])   . Complication of anesthesia    hypotension  . COPD (chronic obstructive pulmonary disease) ([place])   . Coronary artery disease   . Dialysis patient ([place])    Mon, Wed, Fri  . End stage renal disease ([place])   . GERD (gastroesophageal reflux disease)   . Headache   . History of kidney stones   . History of pulmonary embolism   . HLD (hyperlipidemia)   . HTN (hypertension)   . Hyperparathyroidism   . Myocardial infarction ([place])   . Peripheral vascular disease ([place])   . Shortness of breath dyspnea   . Sleep apnea    NO C-PAP, Patient stated in process of  "getting one"   . Tobacco dependence     Assessment: 61 year old male on warfarin for PE. INR  subtherapeutic and will bridge with heparin until INR at goal.    Goal of Therapy: INR 2-3   Heparin level 0.3-0.7 units/ml Monitor platelets by anticoagulation protocol: Yes   Plan:  Per provider, no need to bolus.  Will start heparin drip at 1050 units/hr. Will check an 8-hour heparin level today.  Will check daily HL and CBC while on heparin.   8/26 1814 HL subtherapeutic x 1. 2500 units IV x 1 bolus and increase rate to 1300 units/hr. Will recheck HL in 8 hours.  Laural Benes, Pharm.D., BCPS Clinical Pharmacist 01/19/2017,6:59 PM    0827 0300 heparin level 0.34. Continue current regimen. Recheck in 8 hours to confirm.  Sim Boast, PharmD, BCPS  01/20/17 3:33 AM

## 2017-01-20 LAB — CBC
HEMATOCRIT: 27.1 % — AB (ref 40.0–52.0)
Hemoglobin: 8.9 g/dL — ABNORMAL LOW (ref 13.0–18.0)
MCH: 29.1 pg (ref 26.0–34.0)
MCHC: 32.8 g/dL (ref 32.0–36.0)
MCV: 88.6 fL (ref 80.0–100.0)
PLATELETS: 171 10*3/uL (ref 150–440)
RBC: 3.06 MIL/uL — ABNORMAL LOW (ref 4.40–5.90)
RDW: 18.3 % — AB (ref 11.5–14.5)
WBC: 10.9 10*3/uL — AB (ref 3.8–10.6)

## 2017-01-20 LAB — BASIC METABOLIC PANEL
Anion gap: 15 (ref 5–15)
BUN: 91 mg/dL — AB (ref 6–20)
CHLORIDE: 102 mmol/L (ref 101–111)
CO2: 21 mmol/L — ABNORMAL LOW (ref 22–32)
CREATININE: 12.29 mg/dL — AB (ref 0.61–1.24)
Calcium: 8.7 mg/dL — ABNORMAL LOW (ref 8.9–10.3)
GFR calc Af Amer: 4 mL/min — ABNORMAL LOW (ref >60)
GFR calc non Af Amer: 4 mL/min — ABNORMAL LOW (ref >60)
GLUCOSE: 109 mg/dL — AB (ref 65–99)
Potassium: 4.7 mmol/L (ref 3.5–5.1)
SODIUM: 138 mmol/L (ref 135–145)

## 2017-01-20 LAB — PROTIME-INR
INR: 1.23
Prothrombin Time: 15.6 seconds — ABNORMAL HIGH (ref 11.4–15.2)

## 2017-01-20 LAB — HEPARIN LEVEL (UNFRACTIONATED)
Heparin Unfractionated: 0.34 IU/mL (ref 0.30–0.70)
Heparin Unfractionated: 0.29 IU/mL — ABNORMAL LOW (ref 0.30–0.70)
Heparin Unfractionated: 0.57 IU/mL (ref 0.30–0.70)

## 2017-01-20 LAB — PHOSPHORUS: PHOSPHORUS: 4.4 mg/dL (ref 2.5–4.6)

## 2017-01-20 MED ORDER — APIXABAN 5 MG PO TABS
5.0000 mg | ORAL_TABLET | Freq: Two times a day (BID) | ORAL | Status: DC
  Administered 2017-01-20 – 2017-01-21 (×2): 5 mg via ORAL
  Filled 2017-01-20 (×2): qty 1

## 2017-01-20 MED ORDER — HEPARIN BOLUS VIA INFUSION
1500.0000 [IU] | Freq: Once | INTRAVENOUS | Status: AC
  Administered 2017-01-20: 1500 [IU] via INTRAVENOUS
  Filled 2017-01-20: qty 1500

## 2017-01-20 MED ORDER — APIXABAN 5 MG PO TABS: 5.0000 mg | ORAL_TABLET | Freq: Two times a day (BID) | ORAL | 2 refills | Status: DC

## 2017-01-20 MED ORDER — WARFARIN SODIUM 7.5 MG PO TABS
7.5000 mg | ORAL_TABLET | Freq: Once | ORAL | Status: DC
  Filled 2017-01-20: qty 1

## 2017-01-20 MED ORDER — APIXABAN 5 MG PO TABS: 5.0000 mg | ORAL_TABLET | Freq: Two times a day (BID) | ORAL | Status: DC

## 2017-01-20 MED ORDER — CARVEDILOL 3.125 MG PO TABS: 3.1250 mg | ORAL_TABLET | Freq: Two times a day (BID) | ORAL | 2 refills | Status: DC

## 2017-01-20 MED ORDER — EPOETIN ALFA 10000 UNIT/ML IJ SOLN
4000.0000 [IU] | INTRAMUSCULAR | Status: DC
  Administered 2017-01-20: 4000 [IU] via INTRAVENOUS

## 2017-01-20 MED ORDER — MENTHOL 3 MG MT LOZG: 1.0000 | LOZENGE | OROMUCOSAL | 12 refills | Status: DC | PRN

## 2017-01-20 NOTE — Progress Notes (Signed)
ANTICOAGULATION CONSULT NOTE - follow up  Pharmacy Consult for heparin Indication: pulmonary embolus  Allergies  Allergen Reactions  . Dust Mite Extract Other (See Comments)    Reaction: unknown    Patient Measurements: Height: 6\' 2"  (188 cm) Weight: 185 lb 12.8 oz (84.3 kg) IBW/kg (Calculated) : 82.2 Heparin Dosing Weight: 85 kg   Vital Signs: Temp: 98.9 F (37.2 C) (08/27 0845) Temp Source: Oral (08/27 0845) BP: 101/71 (08/27 1200) Pulse Rate: 77 (08/27 1200)  Labs:  Recent Labs  01/18/17 2002 01/18/17 2337 01/19/17 0549 01/19/17 1118 01/19/17 1814 01/20/17 0251 01/20/17 1054  HGB 10.4*  --  9.5*  --   --  8.9*  --   HCT 31.9*  --  29.0*  --   --  27.1*  --   PLT 213  --  195  --   --  171  --   LABPROT  --   --  14.8  --   --  15.6*  --   INR  --   --  1.15  --   --  1.23  --   HEPARINUNFRC  --   --   --   --  0.14* 0.34 0.29*  CREATININE 9.02*  --  10.26*  --   --  12.29*  --   TROPONINI 0.54* 0.54* 0.58* 0.54*  --   --   --     Estimated Creatinine Clearance: 7.2 mL/min (A) (by C-G formula based on SCr of 12.29 mg/dL (H)).   Medical History: Past Medical History:  Diagnosis Date  . 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 Amputee, below knee, left ([place])   . Anemia   . Asthma   . Cardiomyopathy ([place])   . CHF (congestive heart failure) ([place])   . Chronic systolic heart failure ([place])   . Complication of anesthesia    hypotension  . COPD (chronic obstructive pulmonary disease) ([place])   . Coronary artery disease   . Dialysis patient ([place])    Mon, Wed, Fri  . End stage renal disease ([place])   . GERD (gastroesophageal reflux disease)   . Headache   . History of kidney stones   . History of pulmonary embolism   . HLD (hyperlipidemia)   . HTN (hypertension)   . Hyperparathyroidism   . Myocardial infarction ([place])   . Peripheral vascular disease ([place])   . Shortness of breath dyspnea   . Sleep apnea    NO C-PAP, Patient stated in process of   "getting one"   . Tobacco dependence     Assessment: 61 year old male on warfarin for PE. INR subtherapeutic and will bridge with heparin until INR at goal.    Goal of Therapy: INR 2-3   Heparin level 0.3-0.7 units/ml Monitor platelets by anticoagulation protocol: Yes   Plan:  Per provider, no need to bolus.  Will start heparin drip at 1050 units/hr. Will check an 8-hour heparin level today.  Will check daily HL and CBC while on heparin.   8/26 1814 HL subtherapeutic x 1. 2500 units IV x 1 bolus and increase rate to 1300 units/hr. Will recheck HL in 8 hours.  0827 0300 heparin level 0.34. Continue current regimen. Recheck in 8 hours to confirm. Sim Boast, PharmD, BCPS   8/27 @ 1054 HL subtherapeutic at 0.29. Heparin bolus 1500 units iv x 1, increase rate to 1500 units/hr. Will recheck in 8 hours at 2000.   Donna Christen Callista Hoh, PharmD,  MBA, [facility]    01/20/2017,12:11 PM     01/20/17 12:11 PM

## 2017-01-20 NOTE — Progress Notes (Signed)
[place], [place] 01/20/17  Subjective:  Patient seen and evaluated during hemodialysis today. Appears to be tolerating well.    Objective:  Vital signs in last 24 hours:  Temp:  [98.7 F (37.1 C)-98.9 F (37.2 C)] 98.9 F (37.2 C) (08/27 0845) Pulse Rate:  [71-84] 84 (08/27 1130) Resp:  [13-21] 20 (08/27 1130) BP: (92-113)/(57-77) 105/68 (08/27 1130) SpO2:  [83 %-100 %] 100 % (08/27 1130) Weight:  [84.3 kg (185 lb 12.8 oz)] 84.3 kg (185 lb 12.8 oz) (08/27 0845)  Weight change: -13.064 kg (-28 lb 12.8 oz) Filed Weights   01/19/17 1053 01/20/17 0500 01/20/17 0845  Weight: 84.9 kg (187 lb 3.2 oz) 84.3 kg (185 lb 12.8 oz) 84.3 kg (185 lb 12.8 oz)    Intake/Output:    Intake/Output Summary (Last 24 hours) at 01/20/17 1137 Last data filed at 01/19/17 1500  Gross per 24 hour  Intake            39.03 ml  Output                0 ml  Net            39.03 ml    Physical Exam: General: No acute distress   Head: Normocephalic, atraumatic. Moist oral mucosal membranes  Eyes: Anicteric  Neck: Supple  Lungs:  Clear to auscultation bilateral, normal effort   Heart: S1S2 no rubs  Abdomen:  Soft, nontender  Extremities: Left BKA, right transmetatarsal amputation   Neurologic: Awake, alert, following commands  Skin: No lesions  Access: Left IJ PermCath       Basic Metabolic Panel:   Recent Labs Lab 01/18/17 2002 01/19/17 0549 01/20/17 0251 01/20/17 1054  NA 138 136 138  --   K 4.3 4.5 4.7  --   CL 100* 101 102  --   CO2 22 20* 21*  --   GLUCOSE 113* 90 109*  --   BUN 56* 65* 91*  --   CREATININE 9.02* 10.26* 12.29*  --   CALCIUM 9.4 8.8* 8.7*  --   PHOS  --   --   --  4.4     CBC:  Recent Labs Lab 01/18/17 2002 01/19/17 0549 01/20/17 0251  WBC 10.6 11.8* 10.9*  NEUTROABS 7.4*  --   --   HGB 10.4* 9.5* 8.9*  HCT 31.9* 29.0* 27.1*  MCV 88.4 88.5 88.6  PLT 213 195 171      Lab Results  Component Value Date   HEPBSAG  Negative 10/28/2016   HEPBSAB Non Reactive 05/22/2016      Microbiology:  Recent Results (from the past 240 hour(s))  MRSA PCR Screening     Status: Abnormal   Collection Time: 01/19/17  1:47 AM  Result Value Ref Range Status   MRSA by PCR POSITIVE (A) NEGATIVE Final    Comment:        The GeneXpert MRSA Assay (FDA approved for NASAL specimens only), is one component of a comprehensive MRSA colonization surveillance program. It is not intended to diagnose MRSA infection nor to guide or monitor treatment for MRSA infections. RESULT CALLED TO, READ BACK BY AND VERIFIED WITH: TAKEIA NESETT ON 01/19/17 AT 0316 BY JAG     Coagulation Studies:  Recent Labs  01/19/17 0549 01/20/17 0251  LABPROT 14.8 15.6*  INR 1.15 1.23    Urinalysis: No results for input(s): COLORURINE, LABSPEC, PHURINE, GLUCOSEU, HGBUR, BILIRUBINUR, KETONESUR, PROTEINUR, UROBILINOGEN, NITRITE, LEUKOCYTESUR in the last 72  hours.  Invalid input(s): APPERANCEUR    Imaging: Dg Chest 2 View  Result Date: 01/18/2017 CLINICAL DATA:  Shortness of Breath EXAM: CHEST  2 VIEW COMPARISON:  11/12/2016 FINDINGS: Cardiac shadow remains enlarged. Left dialysis catheter is noted. Lungs are well aerated bilaterally. Patchy chronic changes are again noted in the lateral right lung base. No new focal abnormality is seen. No bony abnormality is noted. IMPRESSION: Stable changes in the right lung base. Electronically Signed   By: Inez Catalina M.D.   On: 01/18/2017 20:36     Medications:   . heparin 1,300 Units/hr (01/20/17 0507)   . aspirin  325 mg Oral Daily  . atorvastatin  10 mg Oral QHS  . calcium acetate  667 mg Oral TID WC  . carvedilol  3.125 mg Oral BID WC  . Chlorhexidine Gluconate Cloth  6 each Topical Q0600  . chlorpheniramine-HYDROcodone  5 mL Oral Q12H  . isosorbide mononitrate  30 mg Oral Daily  . loratadine  10 mg Oral Daily  . mouth rinse  15 mL Mouth Rinse BID  . mometasone-formoterol  2 puff  Inhalation BID  . mupirocin ointment  1 application Nasal BID  . pantoprazole  40 mg Oral Daily  . pregabalin  75 mg Oral BID  . sevelamer carbonate  800 mg Oral Daily  . tiotropium  18 mcg Inhalation Daily  . warfarin  7.5 mg Oral ONCE-1800   acetaminophen **OR** acetaminophen, docusate sodium, guaiFENesin-dextromethorphan, ipratropium-albuterol, menthol-cetylpyridinium, ondansetron **OR** ondansetron (ZOFRAN) IV, oxyCODONE-acetaminophen  Assessment/ Plan:  61 y.o. male with ESRD on hemodialysis, hypertension, hyperlipidemia, anemia, COPD, peripheral vascular disease, left BKA, right toe amputations, Pulm Embolism 07/2016 requiring Warfarin  MWF [facility]phrology [facility].   1.End stage renal disease   - Patient seen and evaluated during hemodialysis today. Appears to be tolerating well. We plan to complete dialysis today.  2. Anemia of chronic kidney disease:  Hemoglobin currently 8.9. We will start the patient on Epogen 4000 units IV with dialysis.  3.  Shortness of breath - Bronchitis vs Pneumonia versus COPD exacerbation - Not on antibiotics at present  4. Secondary Hyperparathyroidism:  Maintain the patient on both calcium acetate as well as Renvela.  Continue to periodically monitor serum phosphorus.   LOS: 2 [name] 8/27/201811:37 AM  [address] [place], [place]

## 2017-01-20 NOTE — Progress Notes (Signed)
Pre-hd tx 

## 2017-01-20 NOTE — Discharge Instructions (Signed)
Heart Failure Clinic appointment on January 28 2017 at 11:40am with Darylene Price, [place]. Please call [phone] to reschedule.

## 2017-01-20 NOTE — Progress Notes (Signed)
Pt. Transferred to 1C via bed.

## 2017-01-20 NOTE — Progress Notes (Signed)
[facility] Encounter Note  Patient: [name] / Admit Date: 01/18/2017 / Date of Encounter: 01/20/2017, 8:19 AM   Subjective: Patient still short of breath with cough and congestion. No significant improvements in edema PND and orthopnea. Hypertension control. No evidence of myocardial infarction  Review of Systems: Positive for: Shortness of breath PND orthopnea Negative for: Vision change, hearing change, syncope, dizziness, nausea, vomiting,diarrhea, bloody stool, stomach pain, cough, congestion, diaphoresis, urinary frequency, urinary pain,skin lesions, skin rashes Others previously listed  Objective: Telemetry: Normal sinus rhythm Physical Exam: Blood pressure 93/62, pulse 76, temperature 98.8 F (37.1 C), temperature source Oral, resp. rate 16, height 6\' 2"  (1.88 m), weight 84.3 kg (185 lb 12.8 oz), SpO2 92 %. Body mass index is 23.86 kg/m. General: Well developed, well nourished, in no acute distress. Head: Normocephalic, atraumatic, sclera non-icteric, no xanthomas, nares are without discharge. Neck: No apparent masses Lungs: Normal respirations with no wheezes, few rhonchi, basilar rales , basilar crackles   Heart: Regular rate and rhythm, normal S1 S2, no murmur, no rub, no gallop, PMI is normal size and placement, carotid upstroke normal without bruit, jugular venous pressure normal Abdomen: Soft, non-tender, non-distended with normoactive bowel sounds. No hepatosplenomegaly. Abdominal aorta is normal size without bruit Extremities: 1+ edema, no clubbing, no cyanosis, no ulcers,  Peripheral: 2+ radial, 2+ femoral, 2+ dorsal pedal pulses on right Neuro: Alert and oriented. Moves all extremities spontaneously. Psych:  Responds to questions appropriately with a normal affect.   Intake/Output Summary (Last 24 hours) at 01/20/17 0819 Last data filed at 01/19/17 1500  Gross per 24 hour  Intake            39.03 ml  Output                0 ml  Net             39.03 ml    Inpatient Medications:  . aspirin  325 mg Oral Daily  . atorvastatin  10 mg Oral QHS  . calcium acetate  667 mg Oral TID WC  . carvedilol  3.125 mg Oral BID WC  . Chlorhexidine Gluconate Cloth  6 each Topical Q0600  . chlorpheniramine-HYDROcodone  5 mL Oral Q12H  . isosorbide mononitrate  30 mg Oral Daily  . loratadine  10 mg Oral Daily  . mouth rinse  15 mL Mouth Rinse BID  . mometasone-formoterol  2 puff Inhalation BID  . mupirocin ointment  1 application Nasal BID  . pantoprazole  40 mg Oral Daily  . pregabalin  75 mg Oral BID  . sevelamer carbonate  800 mg Oral Daily  . tiotropium  18 mcg Inhalation Daily  . warfarin  7.5 mg Oral ONCE-1800   Infusions:  . heparin 1,300 Units/hr (01/20/17 0507)    Labs:  Recent Labs  01/19/17 0549 01/20/17 0251  NA 136 138  K 4.5 4.7  CL 101 102  CO2 20* 21*  GLUCOSE 90 109*  BUN 65* 91*  CREATININE 10.26* 12.29*  CALCIUM 8.8* 8.7*    Recent Labs  01/18/17 2002  AST 49*  ALT 32  ALKPHOS 100  BILITOT 1.0  PROT 8.0  ALBUMIN 2.9*    Recent Labs  01/18/17 2002 01/19/17 0549 01/20/17 0251  WBC 10.6 11.8* 10.9*  NEUTROABS 7.4*  --   --   HGB 10.4* 9.5* 8.9*  HCT 31.9* 29.0* 27.1*  MCV 88.4 88.5 88.6  PLT 213 195 171    Recent  Labs  01/18/17 2002 01/18/17 2337 01/19/17 0549 01/19/17 1118  TROPONINI 0.54* 0.54* 0.58* 0.54*   Invalid input(s): POCBNP No results for input(s): HGBA1C in the last 72 hours.   Weights: Filed Weights   01/19/17 0446 01/19/17 1053 01/20/17 0500  Weight: 85.4 kg (188 lb 3.2 oz) 84.9 kg (187 lb 3.2 oz) 84.3 kg (185 lb 12.8 oz)     Radiology/Studies:  Dg Chest 2 View  Result Date: 01/18/2017 CLINICAL DATA:  Shortness of Breath EXAM: CHEST  2 VIEW COMPARISON:  11/12/2016 FINDINGS: Cardiac shadow remains enlarged. Left dialysis catheter is noted. Lungs are well aerated bilaterally. Patchy chronic changes are again noted in the lateral right lung base. No new  focal abnormality is seen. No bony abnormality is noted. IMPRESSION: Stable changes in the right lung base. Electronically Signed   By: Inez Catalina M.D.   On: 01/18/2017 20:36     Assessment and Recommendation  61 y.o. male with acute on chronic systolic dysfunction congestive heart failure with pulmonary edema essential hypertension makes hyperlipidemia and diabetes multifactorial in nature including end-stage renal disease with excess fluid at this time 1. No further cardiac intervention at this time due to no evidence of myocardial infarction and continued known LV systolic dysfunction likely contributing to congestive heart failure symptoms 2. Dialysis today to help with pulmonary edema hypoxia and shortness of breath 3. Continue isosorbide for venous capacitance of able 4. Carvedilol for heart rate control and cardiomyopathy 5. Atorvastatin for high intensity cholesterol therapy 6. Ambulation and follow for improvements of symptoms and possible discharged home if ambulating well with follow-up in one to 2 weeks for further adjustments of medication management  Signed, Serafina Royals M.D. FACC

## 2017-01-20 NOTE — Progress Notes (Signed)
MD made aware of O2 sats  84%-88% on RA.

## 2017-01-20 NOTE — Care Management (Addendum)
I have notified [name] with [facility]/outpatient hemodialysis of patient's discharge today. No identified RNCM needs. I spoke with patient regarding Eliquis- he was not aware of this change (from Coumadin). Coupon delivered to patient for Eliquis. I spoke with MD and she confirmed change to Eliquis. When I entered patient's room to deliver Coupon patient was acutely using supplemental Oxygen. MD paged. O2 sats are 98% on 2 Liters currently. RN to assess home O2 need.

## 2017-01-20 NOTE — Progress Notes (Signed)
Hemodialysis completed. 

## 2017-01-20 NOTE — Progress Notes (Signed)
Hemodialysis started

## 2017-01-20 NOTE — Progress Notes (Signed)
Post Hemodialysis treatment.

## 2017-01-20 NOTE — Care Management (Signed)
I have notified Dr. Tressia Miners of reported O2 sats of 84% on room air at rest. MD will cancel discharge as staff attempts to wean O2. RN updated.

## 2017-01-20 NOTE — Progress Notes (Signed)
Hemodialysis completed.Tolerated 3.5hours with 1.5liters net removal.CVC capped/clamped and lumens wrapped with gauze/taped.Epogen given during treatment.

## 2017-01-20 NOTE — Discharge Summary (Signed)
[facility] [place] NAME: [name]    MR#:  [id number]  DATE OF BIRTH:  1955-06-29  DATE OF ADMISSION:  01/18/2017   ADMITTING PHYSICIAN: Lance Coon, MD  DATE OF DISCHARGE: 01/20/2017  PRIMARY CARE PHYSICIAN: [facility] [place]   ADMISSION DIAGNOSIS:   Demand ischemia ([place]) [I24.8] Productive cough [R05] Elevated troponin I level [R74.8] ESRD on hemodialysis ([place]) [N18.6, Z99.2] Acute respiratory failure with hypoxemia (HCC) [J96.01] Acute on chronic congestive heart failure, unspecified heart failure type ([place]) [I50.9]  DISCHARGE DIAGNOSIS:   Principal Problem:   Acute on chronic systolic CHF (congestive heart failure) (HCC) Active Problems:   Hyperlipidemia   HYPERTENSION, BENIGN   ESRD (end stage renal disease) on dialysis (HCC)   CAD (coronary artery disease)   Elevated troponin   SECONDARY DIAGNOSIS:   Past Medical History:  Diagnosis Date  . 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 Amputee, below knee, left ([place])   . Anemia   . Asthma   . Cardiomyopathy ([place])   . CHF (congestive heart failure) ([place])   . Chronic systolic heart failure ([place])   . Complication of anesthesia    hypotension  . COPD (chronic obstructive pulmonary disease) ([place])   . Coronary artery disease   . Dialysis patient ([place])    Mon, Wed, Fri  . End stage renal disease ([place])   . GERD (gastroesophageal reflux disease)   . Headache   . History of kidney stones   . History of pulmonary embolism   . HLD (hyperlipidemia)   . HTN (hypertension)   . Hyperparathyroidism   . Myocardial infarction ([place])   . Peripheral vascular disease ([place])   . Shortness of breath dyspnea   . Sleep apnea    NO C-PAP, Patient stated in process of  "getting one"   . Tobacco dependence     HOSPITAL COURSE:   61 year old male with past medical history significant for chronic systolic CHF with EF of 99%, COPD, CAD status post  stents, end-stage renal disease on Monday, Wednesday and Friday hemodialysis, asthma, neuropathy and amputation of left below knee presents to hospital secondary to worsening shortness of breath and cough.  #1 acute on chronic systolic CHF exacerbation-chest x-ray was not impressive but patient clinically has cough and bibasilar crackles on exam. -Does not make much urine,received dialysis. Nephrology consulted. -Strict input and output monitoring, daily weights and low salt diet. - has chronic dyspnea  #2 cough and hoarseness of voice-upper respiratory infection with laryngitis -PRN antihistamines, cough medicines and monitor  -will hold off on antibiotics at this time. Lozenges as needed  #3 CAD status post stents-troponins are stable, less likely to be an acute MI. Secondary to demand ischemia -Continue isosorbide if blood pressure is able to tolerate. Appreciate cardiology consult. -on aspirin and Coreg  #4 end-stage renal disease-on Monday, Wednesday and Friday hemodialysis. -Follows with [facility] nephrology. Received dialysis today in the hospital  #5 pulmonary emboli-diagnosed with right upper lobe pulmonary emboli in March 2018, on Coumadin but INR is subtherapeutic. -with his chronic dyspnea symptoms, questionable noncompliance - will change his Coumadin to eliquis at discharge. Received heparin while he is in the hospital.  Recommend ENT follow up as outpatient. Patient will be discharged today  DISCHARGE CONDITIONS:   guarded CONSULTS OBTAINED:   Treatment Team:  Corey Skains, MD Murlean Iba, MD  DRUG ALLERGIES:   Allergies  Allergen Reactions  .  Dust Mite Extract Other (See Comments)    Reaction: unknown   DISCHARGE MEDICATIONS:   Allergies as of 01/20/2017      Reactions   Dust Mite Extract Other (See Comments)   Reaction: unknown      Medication List    STOP taking these medications   HYDROcodone-acetaminophen 5-325 MG tablet Commonly known  as:  NORCO/VICODIN   warfarin 2.5 MG tablet Commonly known as:  COUMADIN   warfarin 3 MG tablet Commonly known as:  COUMADIN     TAKE these medications   acetaminophen 500 MG tablet Commonly known as:  TYLENOL Take 1,000 mg by mouth every 6 (six) hours as needed (discomfort).   apixaban 5 MG Tabs tablet Commonly known as:  ELIQUIS Take 1 tablet (5 mg total) by mouth 2 (two) times daily.   aspirin 325 MG tablet Take 325 mg by mouth daily.   atorvastatin 10 MG tablet Commonly known as:  LIPITOR Take 10 mg by mouth at bedtime.   budesonide-formoterol 160-4.5 MCG/ACT inhaler Commonly known as:  SYMBICORT Inhale 2 puffs into the lungs 2 (two) times daily. Reported on 08/14/2015   calcium acetate 667 MG capsule Commonly known as:  PHOSLO Take 667 mg by mouth 3 (three) times daily with meals.   carvedilol 3.125 MG tablet Commonly known as:  COREG Take 1 tablet (3.125 mg total) by mouth 2 (two) times daily with a meal.   cetirizine 10 MG tablet Commonly known as:  ZYRTEC Take 10 mg by mouth daily.   docusate sodium 100 MG capsule Commonly known as:  COLACE Take 100 mg by mouth 2 (two) times daily as needed for mild constipation.   feeding supplement (NEPRO CARB STEADY) Liqd Take 237 mLs by mouth daily.   fluticasone 50 MCG/ACT nasal spray Commonly known as:  FLONASE Place 1 spray into both nostrils daily.   ipratropium-albuterol 0.5-2.5 (3) MG/3ML Soln Commonly known as:  DUONEB Take 3 mLs by nebulization every 4 (four) hours as needed (SOB).   COMBIVENT RESPIMAT 20-100 MCG/ACT Aers respimat Generic drug:  Ipratropium-Albuterol Inhale 1 puff into the lungs every 6 (six) hours as needed for wheezing or shortness of breath.   isosorbide mononitrate 30 MG 24 hr tablet Commonly known as:  IMDUR Take 1 tablet (30 mg total) by mouth daily. What changed:  how much to take   menthol-cetylpyridinium 3 MG lozenge Commonly known as:  CEPACOL Take 1 lozenge (3 mg total)  by mouth as needed for sore throat.   midodrine 10 MG tablet Commonly known as:  PROAMATINE Take 5 mg by mouth 2 (two) times daily.   multivitamin with minerals Tabs tablet Take 1 tablet by mouth 2 (two) times daily.   nitroGLYCERIN 0.4 MG SL tablet Commonly known as:  NITROSTAT Place 0.4 mg under the tongue every 5 (five) minutes as needed for chest pain.   oxyCODONE 5 MG immediate release tablet Commonly known as:  Oxy IR/ROXICODONE Take 5 mg by mouth every 8 (eight) hours as needed for severe pain.   pantoprazole 20 MG tablet Commonly known as:  PROTONIX Take 20 mg by mouth daily.   polyethylene glycol packet Commonly known as:  MIRALAX / GLYCOLAX Take 17 g by mouth daily as needed for moderate constipation. Hold for loose stools.   pregabalin 75 MG capsule Commonly known as:  LYRICA Take 75 mg by mouth 2 (two) times daily.   sevelamer carbonate 800 MG tablet Commonly known as:  RENVELA Take 800 mg by  mouth daily.   tiotropium 18 MCG inhalation capsule Commonly known as:  SPIRIVA Place 18 mcg into inhaler and inhale daily.            Discharge Care Instructions        Start     Ordered   01/20/17 0000  menthol-cetylpyridinium (CEPACOL) 3 MG lozenge  As needed     01/20/17 1346   01/20/17 0000  carvedilol (COREG) 3.125 MG tablet  2 times daily with meals     01/20/17 1346   01/20/17 0000  Diet - low sodium heart healthy     01/20/17 1346   01/20/17 0000  Activity as tolerated - No restrictions     01/20/17 1346   01/20/17 0000  apixaban (ELIQUIS) 5 MG TABS tablet  2 times daily     01/20/17 1507       DISCHARGE INSTRUCTIONS:   1. PCP follow-up in 1-2 weeks 2. For dialysis as per schedule in 2 days 3. Cardiology follow up in 1-2 weeks 4. ENT follow-up in 1 week if hoarseness does not improve  DIET:   Renal diet  ACTIVITY:   Activity as tolerated  OXYGEN:   Home Oxygen: No.  Oxygen Delivery: room air  DISCHARGE LOCATION:   home   If  you experience worsening of your admission symptoms, develop shortness of breath, life threatening emergency, suicidal or homicidal thoughts you must seek medical attention immediately by calling 911 or calling your MD immediately  if symptoms less severe.  You Must read complete instructions/literature along with all the possible adverse reactions/side effects for all the Medicines you take and that have been prescribed to you. Take any new Medicines after you have completely understood and accpet all the possible adverse reactions/side effects.   Please note  You were cared for by a hospitalist during your hospital stay. If you have any questions about your discharge medications or the care you received while you were in the hospital after you are discharged, you can call the unit and asked to speak with the hospitalist on call if the hospitalist that took care of you is not available. Once you are discharged, your primary care physician will handle any further medical issues. Please note that NO REFILLS for any discharge medications will be authorized once you are discharged, as it is imperative that you return to your primary care physician (or establish a relationship with a primary care physician if you do not have one) for your aftercare needs so that they can reassess your need for medications and monitor your lab values.    On the day of Discharge:  VITAL SIGNS:   Blood pressure 112/64, pulse 84, temperature (!) 97.3 F (36.3 C), temperature source Oral, resp. rate 16, height 6\' 2"  (1.88 m), weight 84.3 kg (185 lb 12.8 oz), SpO2 100 %.  PHYSICAL EXAMINATION:   GENERAL:  61 y.o.-year-old patient lying in the bed with no acute distress.  EYES: Pupils equal, round, reactive to light and accommodation. No scleral icterus. Extraocular muscles intact.  HEENT: Head atraumatic, normocephalic. Oropharynx and nasopharynx clear.  NECK:  Supple, no jugular venous distention. No thyroid enlargement,  no tenderness.  LUNGS: Normal breath sounds bilaterally, no wheezing, rhonchi or crepitation. No use of accessory muscles of respiration. Fine bibasilar crackles, appears tachypneic on minimal exertion CARDIOVASCULAR: S1, S2 normal. No rubs, or gallops. 2/6 systolic murmur is present ABDOMEN: Soft, nontender, nondistended. Bowel sounds present. No organomegaly or mass.  EXTREMITIES: Status post  left BKA, No pedal edema, cyanosis, or clubbing.  NEUROLOGIC: Cranial nerves II through XII are intact. Muscle strength 5/5 in all extremities. Sensation intact. Gait not checked.  PSYCHIATRIC: The patient is alert and oriented x 3.  SKIN: No obvious rash, lesion, or ulcer.   DATA REVIEW:   CBC  Recent Labs Lab 01/20/17 0251  WBC 10.9*  HGB 8.9*  HCT 27.1*  PLT 171    Chemistries   Recent Labs Lab 01/18/17 2002  01/20/17 0251  NA 138  < > 138  K 4.3  < > 4.7  CL 100*  < > 102  CO2 22  < > 21*  GLUCOSE 113*  < > 109*  BUN 56*  < > 91*  CREATININE 9.02*  < > 12.29*  CALCIUM 9.4  < > 8.7*  AST 49*  --   --   ALT 32  --   --   ALKPHOS 100  --   --   BILITOT 1.0  --   --   < > = values in this interval not displayed.   Microbiology Results  Results for orders placed or performed during the hospital encounter of 01/18/17  MRSA PCR Screening     Status: Abnormal   Collection Time: 01/19/17  1:47 AM  Result Value Ref Range Status   MRSA by PCR POSITIVE (A) NEGATIVE Final    Comment:        The GeneXpert MRSA Assay (FDA approved for NASAL specimens only), is one component of a comprehensive MRSA colonization surveillance program. It is not intended to diagnose MRSA infection nor to guide or monitor treatment for MRSA infections. RESULT CALLED TO, READ BACK BY AND VERIFIED WITH: TAKEIA NESETT ON 01/19/17 AT 0316 BY JAG     RADIOLOGY:  No results found.   Management plans discussed with the patient, family and they are in agreement.  CODE STATUS:     Code Status  Orders        Start     Ordered   01/19/17 0145  Full code  Continuous     01/19/17 0145    Code Status History    Date Active Date Inactive Code Status Order ID Comments User Context   11/11/2016  4:21 AM 11/14/2016  7:08 PM Full Code [id number]  Harrie Foreman, MD Inpatient   10/28/2016  7:56 AM 10/30/2016  7:30 PM Full Code [id number]  Demetrios Loll, MD ED   08/21/2016  4:08 AM 08/22/2016  6:26 PM DNR [id number]  Harrie Foreman, MD Inpatient   08/03/2016 11:19 PM 08/07/2016  7:28 PM DNR [id number]  Lance Coon, MD Inpatient   07/26/2016  5:07 PM 07/31/2016  9:58 PM DNR [id number]  Vaughan Basta, MD Inpatient   05/22/2016 12:34 AM 05/24/2016  7:11 PM Full Code [id number]  Hugelmeyer, [place], DO Inpatient   01/13/2015  1:44 AM 01/14/2015  2:46 PM Full Code [id number]  Juluis Mire, MD ED   12/15/2014  9:32 AM 12/15/2014  2:31 PM Full Code [id number]  Algernon Huxley, MD Inpatient   05/06/2014  8:29 PM 05/10/2014  5:23 PM Full Code [id number]  Elam Dutch, MD Inpatient   05/04/2014 10:24 PM 05/06/2014  8:29 PM Full Code [id number]  Rise Patience, MD Inpatient      TOTAL TIME TAKING CARE OF THIS PATIENT: 37 minutes.    Gladstone Lighter M.D on 01/20/2017 at 3:52 PM  Between 7am to 6pm - Pager - [phone]  After 6pm  go to [email or web address] - Technical brewer [place] [facility]  Office  [phone]  CC: Primary care physician; [facility] [place]   Note: This dictation was prepared with Diplomatic Services operational officer dictation along with smaller phrase technology. Any transcriptional errors that result from this process are unintentional.

## 2017-01-20 NOTE — Progress Notes (Signed)
ANTICOAGULATION CONSULT NOTE - Follow up  Pharmacy Consult for warfarin dosing Indication: VTE treatment  Allergies  Allergen Reactions  . Dust Mite Extract Other (See Comments)    Reaction: unknown    Patient Measurements: Height: 6\' 2"  (188 cm) Weight: 185 lb 12.8 oz (84.3 kg) IBW/kg (Calculated) : 82.2 Heparin Dosing Weight: n/a  Vital Signs: Temp: 98.8 F (37.1 C) (08/27 0401) Temp Source: Oral (08/27 0401) BP: 93/62 (08/27 0401) Pulse Rate: 76 (08/27 0401)  Labs:  Recent Labs  01/18/17 2002 01/18/17 2337 01/19/17 0549 01/19/17 1118 01/19/17 1814 01/20/17 0251  HGB 10.4*  --  9.5*  --   --  8.9*  HCT 31.9*  --  29.0*  --   --  27.1*  PLT 213  --  195  --   --  171  LABPROT  --   --  14.8  --   --  15.6*  INR  --   --  1.15  --   --  1.23  HEPARINUNFRC  --   --   --   --  0.14* 0.34  CREATININE 9.02*  --  10.26*  --   --  12.29*  TROPONINI 0.54* 0.54* 0.58* 0.54*  --   --     Estimated Creatinine Clearance: 7.2 mL/min (A) (by C-G formula based on SCr of 12.29 mg/dL (H)).   Medical History: Past Medical History:  Diagnosis Date  . 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 Amputee, below knee, left ([place])   . Anemia   . Asthma   . Cardiomyopathy ([place])   . CHF (congestive heart failure) ([place])   . Chronic systolic heart failure ([place])   . Complication of anesthesia    hypotension  . COPD (chronic obstructive pulmonary disease) ([place])   . Coronary artery disease   . Dialysis patient ([place])    Mon, Wed, Fri  . End stage renal disease ([place])   . GERD (gastroesophageal reflux disease)   . Headache   . History of kidney stones   . History of pulmonary embolism   . HLD (hyperlipidemia)   . HTN (hypertension)   . Hyperparathyroidism   . Myocardial infarction ([place])   . Peripheral vascular disease ([place])   . Shortness of breath dyspnea   . Sleep apnea    NO C-PAP, Patient stated in process of  "getting one"   . Tobacco dependence      Medications:  Home meds include warfarin 2.5 and 3 mg. Unsure of home regimen. Med rec tech to follow up with patient's pharmacy.  Assessment: INR subtherapeutic on admission  Goal of Therapy:  INR 2-3    Plan:  8/26 Since patient's INR is close to 1 and home regimen/compliance unknown will start at 7.5 mg x1 per score card nomogram. F/u INR with Monday AM labs.  8/27 INR 1.23, repeat warfarin 7.5mg  x 1 dose tonight. Heparin gtt has had one therapeutic level. F/u INR tomorrow    Thomasenia Sales, PharmD, MBA, BCGP Clinical Pharmacist [facility]    01/20/2017,8:02 AM

## 2017-01-20 NOTE — Progress Notes (Signed)
Pre-Hemodialysis 

## 2017-01-21 ENCOUNTER — Inpatient Hospital Stay: Payer: Medicare Other

## 2017-01-21 LAB — HEPATITIS B SURFACE ANTIGEN: HEP B S AG: NEGATIVE

## 2017-01-21 MED ORDER — CEFUROXIME AXETIL 250 MG PO TABS: 250.0000 mg | ORAL_TABLET | Freq: Every day | ORAL | 0 refills | Status: DC

## 2017-01-21 MED ORDER — PREGABALIN 75 MG PO CAPS: 75.0000 mg | ORAL_CAPSULE | Freq: Every day | ORAL | 0 refills | Status: AC

## 2017-01-21 MED ORDER — CEFUROXIME AXETIL 250 MG PO TABS: 250.0000 mg | ORAL_TABLET | ORAL | Status: DC

## 2017-01-21 MED ORDER — CEFUROXIME AXETIL 250 MG PO TABS: 250.0000 mg | ORAL_TABLET | ORAL | 0 refills | Status: DC

## 2017-01-21 MED ORDER — CEFUROXIME AXETIL 250 MG PO TABS: 250.0000 mg | ORAL_TABLET | Freq: Two times a day (BID) | ORAL | Status: DC

## 2017-01-21 MED ORDER — PREGABALIN 75 MG PO CAPS: 75.0000 mg | ORAL_CAPSULE | Freq: Every day | ORAL | Status: DC

## 2017-01-21 MED ORDER — CEFUROXIME AXETIL 250 MG PO TABS
250.0000 mg | ORAL_TABLET | Freq: Every day | ORAL | Status: DC
  Administered 2017-01-21: 16:00:00 250 mg via ORAL
  Filled 2017-01-21: qty 1

## 2017-01-21 NOTE — Progress Notes (Signed)
PHARMACY NOTE:  ANTIMICROBIAL RENAL DOSAGE ADJUSTMENT  Current antimicrobial regimen includes a mismatch between antimicrobial dosage and estimated renal function.  As per policy approved by the Pharmacy & Therapeutics and Medical Executive Committees, the antimicrobial dosage will be adjusted accordingly.  Current antimicrobial dosage:  ceftin 250mg  BID  Indication: PNA  Renal Function:  Estimated Creatinine Clearance: 7.2 mL/min (A) (by C-G formula based on SCr of 12.29 mg/dL (H)). [x]      On intermittent HD, scheduled: []      On CRRT    Antimicrobial dosage has been changed to:  ceftin 250mg  q 24hr plus an ADDITIONAL 250mg  on MWF AFTER HD  Additional comments:   Thank you for allowing pharmacy to be a part of this patient's care.  Ramond Dial, Pharm.D, BCPS Clinical Pharmacist  01/21/2017 2:40 PM

## 2017-01-21 NOTE — Plan of Care (Signed)
SATURATION QUALIFICATIONS: (This note is used to comply with regulatory documentation for home oxygen)  Patient Saturations on Room Air at Rest = 95  Patient Saturations on Room Air while Ambulating = 86%  Patient Saturations on 2 Liters of oxygen while Ambulating = 95%  Please briefly explain why patient needs home oxygen: Pt has COPD, CHF and End Stage Renal Disease

## 2017-01-21 NOTE — Care Management (Signed)
Discharge to home today per Dr. Tressia Miners. [name] has tried nebulizers and inhalers, but had to go back to oxygen per nasal cannula for respiratory relief. Family will transport Joel Ammons RN MSN CCM Care Management [phone]

## 2017-01-21 NOTE — Progress Notes (Signed)
[facility] [place] NAME: [name]    MR#:  [id number]  DATE OF BIRTH:  [birth year]  SUBJECTIVE:  CHIEF COMPLAINT:   Chief Complaint  Patient presents with  . Shortness of Breath   - discharge was held yesterday as patient needed home oxygen. -Saturations today at rest or 94% but dropping down to 86-87% when he is sleeping. -Has right arm myoclonic jerks which she states happens occasionally at home as well. Appears sleepy.  REVIEW OF SYSTEMS:  Review of Systems  Constitutional: Positive for malaise/fatigue. Negative for chills and fever.  HENT: Negative for congestion, ear discharge, hearing loss and nosebleeds.   Eyes: Negative for blurred vision and double vision.  Respiratory: Positive for cough and shortness of breath. Negative for sputum production and wheezing.   Cardiovascular: Negative for chest pain, palpitations and leg swelling.  Gastrointestinal: Negative for abdominal pain, constipation, diarrhea, nausea and vomiting.  Genitourinary: Negative for dysuria.  Musculoskeletal: Negative for myalgias.  Neurological: Positive for tremors. Negative for dizziness, speech change, focal weakness, seizures and headaches.  Psychiatric/Behavioral: Negative for depression.    DRUG ALLERGIES:   Allergies  Allergen Reactions  . Dust Mite Extract Other (See Comments)    Reaction: unknown    VITALS:  Blood pressure 99/61, pulse 79, temperature 98 F (36.7 C), temperature source Oral, resp. rate 18, height 6\' 2"  (1.88 m), weight 86.3 kg (190 lb 4 oz), SpO2 100 %.  PHYSICAL EXAMINATION:  Physical Exam  GENERAL:  61 y.o.-year-old patient lying in the bed with no acute distress.  EYES: Pupils equal, round, reactive to light and accommodation. No scleral icterus. Extraocular muscles intact.  HEENT: Head atraumatic, normocephalic. Oropharynx and nasopharynx clear.  NECK:  Supple, no jugular venous distention. No thyroid  enlargement, no tenderness.  LUNGS: Normal breath sounds bilaterally, no wheezing, rhonchi or crepitation. No use of accessory muscles of respiration. Fine bibasilar crackles, appears tachypneic on minimal exertion CARDIOVASCULAR: S1, S2 normal. No rubs, or gallops. 2/6 systolic murmur is present ABDOMEN: Soft, nontender, nondistended. Bowel sounds present. No organomegaly or mass.  EXTREMITIES: Status post left BKA, No pedal edema, cyanosis, or clubbing.  NEUROLOGIC: Cranial nerves II through XII are intact. Muscle strength 5/5 in all extremities. Sensation intact. Gait not checked. Right arm myoclonic jerks noted. PSYCHIATRIC: The patient is sleepy, easily arousable  SKIN: No obvious rash, lesion, or ulcer.    LABORATORY PANEL:   CBC  Recent Labs Lab 01/20/17 0251  WBC 10.9*  HGB 8.9*  HCT 27.1*  PLT 171   ------------------------------------------------------------------------------------------------------------------  Chemistries   Recent Labs Lab 01/18/17 2002  01/20/17 0251  NA 138  < > 138  K 4.3  < > 4.7  CL 100*  < > 102  CO2 22  < > 21*  GLUCOSE 113*  < > 109*  BUN 56*  < > 91*  CREATININE 9.02*  < > 12.29*  CALCIUM 9.4  < > 8.7*  AST 49*  --   --   ALT 32  --   --   ALKPHOS 100  --   --   BILITOT 1.0  --   --   < > = values in this interval not displayed. ------------------------------------------------------------------------------------------------------------------  Cardiac Enzymes  Recent Labs Lab 01/19/17 1118  TROPONINI 0.54*   ------------------------------------------------------------------------------------------------------------------  RADIOLOGY:  Dg Chest 2 View  Result Date: 01/21/2017 CLINICAL DATA:  Shortness of breath since episode of pneumonia proximally 2 months ago. History  of previous pulmonary embolism, CHF, COPD, and coronary artery disease. Chronic renal insufficiency on dialysis. Current smoker. EXAM: CHEST  2 VIEW  COMPARISON:  PA and lateral chest x-ray of January 18, 2017 FINDINGS: The lungs are well-expanded. A small right pleural effusion persists. There persistent increased lung markings at the right base. The cardiac silhouette remains enlarged. The pulmonary vascularity is mildly engorged but the pulmonary interstitium is clearer today. The dialysis catheter tip projects at the cavoatrial junction. There is calcification in the wall of the aortic arch. The observed bony thorax exhibits no acute abnormality. IMPRESSION: Right lower lobe atelectasis or pneumonia with small right pleural effusion. CHF with improving interstitial edema. Stable cardiomegaly. Thoracic aortic atherosclerosis. Electronically Signed   By: David  Martinique M.D.   On: 01/21/2017 07:39    EKG:   Orders placed or performed during the hospital encounter of 01/18/17  . EKG 12-Lead  . EKG 12-Lead  . ED EKG  . ED EKG    ASSESSMENT AND PLAN:   61 year old male with past medical history significant for chronic systolic CHF with EF of 50%, COPD, CAD status post stents, end-stage renal disease on Monday, Wednesday and Friday hemodialysis, asthma, neuropathy and amputation of left below knee presents to hospital secondary to worsening shortness of breath and cough.  #1 acute on chronic systolic CHF exacerbation-chest x-ray was not impressive but patient clinically has cough and bibasilar crackles on exam. -Does not make much urine, s/p dialysis. Nephrology consulted. -Strict input and output monitoring, daily weights and low salt diet.  #2 cough and hoarseness of voice-upper respiratory infection with laryngitis -Add antihistamines, cough medicines and monitor  -added ceftin  #3 CAD status post stents-troponins are stable, less likely to be an acute MI. Secondary to demand ischemia -Continue isosorbide if blood pressure is able to tolerate. Appreciate cardiology consult. -on aspirin and Coreg  #4 end-stage renal disease-on Monday,  Wednesday and Friday hemodialysis. -Follows with [facility] nephrology. Consult nephrology for dialysis in the hospital.  #5 pulmonary emboli-diagnosed with right upper lobe pulmonary emboli in March 2018, on Coumadin but INR is subtherapeutic. -with his chronic dyspnea symptoms, questionable noncompliance -started eliquis  #6 Myoclonic jerks- decrease lyrica dose.  #6 DVT Prophylaxis- on eliquis   All the records are reviewed and case discussed with Care Management/Social Workerr. Management plans discussed with the patient, family and they are in agreement.  CODE STATUS: Full code  TOTAL TIME TAKING CARE OF THIS PATIENT: 37 minutes.   POSSIBLE D/C TODAY, DEPENDING ON CLINICAL CONDITION.   Jimmie Dattilio M.D on 01/21/2017 at 2:53 PM  Between 7am to 6pm - Pager - [phone]  After 6pm go to [email or web address] - password [email or web address] [place] [facility]  Office  [phone]  CC: Primary care physician; [facility] [place]

## 2017-01-21 NOTE — Progress Notes (Signed)
Patient returned from radiology

## 2017-01-21 NOTE — Progress Notes (Signed)
Patient to radiology.

## 2017-01-21 NOTE — Plan of Care (Signed)
Pt d/ced home.  He's qualified for home O2.  He gets very SOB with minimal exertion. Lethargic but does rouse.  He desats significantly in his sleep. Continues to have a very croupy cough and is very hoarse. Dialysis pt MWF.  He's also chronically hypotensive.  Did alert Dr. Tressia Miners and she said he's on midrodrine at home but hasn't been here.  Held coreg this am. On fluid restriction/renal diet.  He was going to be d/ced yesterday but desated.  Is going home with his sister.

## 2017-01-21 NOTE — Progress Notes (Signed)
[facility], [place] 01/21/17  Subjective:  Patient feeling well this AM.  Ate most of his breakfast.  Had HD yesterday.   Objective:  Vital signs in last 24 hours:  Temp:  [97.3 F (36.3 C)-99.1 F (37.3 C)] 98 F (36.7 C) (08/28 0532) Pulse Rate:  [74-84] 79 (08/28 1031) Resp:  [16-18] 18 (08/28 0532) BP: (83-112)/(59-79) 99/61 (08/28 1031) SpO2:  [84 %-100 %] 100 % (08/28 0532) Weight:  [85.2 kg (187 lb 12.8 oz)-86.3 kg (190 lb 4 oz)] 86.3 kg (190 lb 4 oz) (08/27 2230)  Weight change: -0.635 kg (-1 lb 6.4 oz) Filed Weights   01/20/17 0845 01/20/17 1926 01/20/17 2230  Weight: 84.3 kg (185 lb 12.8 oz) 85.2 kg (187 lb 12.8 oz) 86.3 kg (190 lb 4 oz)    Intake/Output:    Intake/Output Summary (Last 24 hours) at 01/21/17 1224 Last data filed at 01/20/17 1949  Gross per 24 hour  Intake                0 ml  Output             1500 ml  Net            -1500 ml    Physical Exam: General: No acute distress   Head: Normocephalic, atraumatic. Moist oral mucosal membranes  Eyes: Anicteric  Neck: Supple  Lungs:  Clear to auscultation bilateral, normal effort   Heart: S1S2 no rubs  Abdomen:  Soft, nontender  Extremities: Left BKA, right transmetatarsal amputation   Neurologic: Awake, alert, following commands  Skin: No lesions  Access: Left IJ PermCath       Basic Metabolic Panel:   Recent Labs Lab 01/18/17 2002 01/19/17 0549 01/20/17 0251 01/20/17 1054  NA 138 136 138  --   K 4.3 4.5 4.7  --   CL 100* 101 102  --   CO2 22 20* 21*  --   GLUCOSE 113* 90 109*  --   BUN 56* 65* 91*  --   CREATININE 9.02* 10.26* 12.29*  --   CALCIUM 9.4 8.8* 8.7*  --   PHOS  --   --   --  4.4     CBC:  Recent Labs Lab 01/18/17 2002 01/19/17 0549 01/20/17 0251  WBC 10.6 11.8* 10.9*  NEUTROABS 7.4*  --   --   HGB 10.4* 9.5* 8.9*  HCT 31.9* 29.0* 27.1*  MCV 88.4 88.5 88.6  PLT 213 195 171      Lab Results  Component Value Date    HEPBSAG Negative 01/20/2017   HEPBSAB Non Reactive 05/22/2016      Microbiology:  Recent Results (from the past 240 hour(s))  MRSA PCR Screening     Status: Abnormal   Collection Time: 01/19/17  1:47 AM  Result Value Ref Range Status   MRSA by PCR POSITIVE (A) NEGATIVE Final    Comment:        The GeneXpert MRSA Assay (FDA approved for NASAL specimens only), is one component of a comprehensive MRSA colonization surveillance program. It is not intended to diagnose MRSA infection nor to guide or monitor treatment for MRSA infections. RESULT CALLED TO, READ BACK BY AND VERIFIED WITH: TAKEIA NESETT ON 01/19/17 AT 0316 BY JAG     Coagulation Studies:  Recent Labs  01/19/17 0549 01/20/17 0251  LABPROT 14.8 15.6*  INR 1.15 1.23    Urinalysis: No results for input(s): COLORURINE, LABSPEC, [place], GLUCOSEU, HGBUR, BILIRUBINUR, KETONESUR,  PROTEINUR, UROBILINOGEN, NITRITE, LEUKOCYTESUR in the last 72 hours.  Invalid input(s): APPERANCEUR    Imaging: Dg Chest 2 View  Result Date: 01/21/2017 CLINICAL DATA:  Shortness of breath since episode of pneumonia proximally 2 months ago. History of previous pulmonary embolism, CHF, COPD, and coronary artery disease. Chronic renal insufficiency on dialysis. Current smoker. EXAM: CHEST  2 VIEW COMPARISON:  PA and lateral chest x-ray of January 18, 2017 FINDINGS: The lungs are well-expanded. A small right pleural effusion persists. There persistent increased lung markings at the right base. The cardiac silhouette remains enlarged. The pulmonary vascularity is mildly engorged but the pulmonary interstitium is clearer today. The dialysis catheter tip projects at the cavoatrial junction. There is calcification in the wall of the aortic arch. The observed bony thorax exhibits no acute abnormality. IMPRESSION: Right lower lobe atelectasis or pneumonia with small right pleural effusion. CHF with improving interstitial edema. Stable cardiomegaly.  Thoracic aortic atherosclerosis. Electronically Signed   By: David  Martinique M.D.   On: 01/21/2017 07:39     Medications:    . apixaban  5 mg Oral BID  . aspirin  325 mg Oral Daily  . atorvastatin  10 mg Oral QHS  . calcium acetate  667 mg Oral TID WC  . carvedilol  3.125 mg Oral BID WC  . Chlorhexidine Gluconate Cloth  6 each Topical Q0600  . chlorpheniramine-HYDROcodone  5 mL Oral Q12H  . epoetin (EPOGEN/PROCRIT) injection  4,000 Units Intravenous Q M,W,F-HD  . loratadine  10 mg Oral Daily  . mouth rinse  15 mL Mouth Rinse BID  . mometasone-formoterol  2 puff Inhalation BID  . mupirocin ointment  1 application Nasal BID  . pantoprazole  40 mg Oral Daily  . pregabalin  75 mg Oral BID  . sevelamer carbonate  800 mg Oral Daily  . tiotropium  18 mcg Inhalation Daily   acetaminophen **OR** acetaminophen, docusate sodium, guaiFENesin-dextromethorphan, ipratropium-albuterol, menthol-cetylpyridinium, ondansetron **OR** ondansetron (ZOFRAN) IV, oxyCODONE-acetaminophen  Assessment/ Plan:  61 y.o. male with ESRD on hemodialysis, hypertension, hyperlipidemia, anemia, COPD, peripheral vascular disease, left BKA, right toe amputations, Pulm Embolism 07/2016 requiring Warfarin  MWF [facility].   1.End stage renal disease   - Patient had HD yesterday, no acute indication for HD today, will plan for HD tomorrow if patient is here, otherwise he will resume his normal outpatient schedule.   2. Anemia of chronic kidney disease:  Hemoglobin currently 8.9, will continue to monitor.    3.  Shortness of breath - Bronchitis vs Pneumonia versus COPD exacerbation - Not on antibiotics at present - improved significantly.   4. Secondary Hyperparathyroidism:  Phos at target at 4.4, no changes in binder therapy at this time.     LOS: 3 Rosena Bartle 8/28/201812:24 PM  [facility] [place], [place]

## 2017-01-21 NOTE — Care Management Important Message (Signed)
Important Message  Patient Details  Name: PASCHAL BLANTON MRN: [id number] Date of Birth: June 01, 1955   Medicare Important Message Given:  Yes    Shelbie Ammons, RN 01/21/2017, 8:16 AM

## 2017-01-28 ENCOUNTER — Emergency Department
Admission: EM | Admit: 2017-01-28 | Discharge: 2017-01-28 | Disposition: A | Discharge status: Home / Self Care | Payer: Medicare Other | Attending: Emergency Medicine | Admitting: Emergency Medicine

## 2017-01-28 ENCOUNTER — Ambulatory Visit: Payer: Medicare Other | Admitting: Family

## 2017-01-28 ENCOUNTER — Encounter: Payer: Self-pay | Admitting: *Deleted

## 2017-01-28 ENCOUNTER — Telehealth: Payer: Self-pay | Admitting: Family

## 2017-01-28 DIAGNOSIS — Z992 Dependence on renal dialysis: Secondary | ICD-10-CM | POA: Insufficient documentation

## 2017-01-28 DIAGNOSIS — Z7901 Long term (current) use of anticoagulants: Secondary | ICD-10-CM | POA: Diagnosis not present

## 2017-01-28 DIAGNOSIS — I132 Hypertensive heart and chronic kidney disease with heart failure and with stage 5 chronic kidney disease, or end stage renal disease: Secondary | ICD-10-CM | POA: Insufficient documentation

## 2017-01-28 DIAGNOSIS — I2510 Atherosclerotic heart disease of native coronary artery without angina pectoris: Secondary | ICD-10-CM | POA: Diagnosis not present

## 2017-01-28 DIAGNOSIS — N481 Balanitis: Secondary | ICD-10-CM | POA: Diagnosis not present

## 2017-01-28 DIAGNOSIS — N186 End stage renal disease: Secondary | ICD-10-CM | POA: Insufficient documentation

## 2017-01-28 DIAGNOSIS — F17210 Nicotine dependence, cigarettes, uncomplicated: Secondary | ICD-10-CM | POA: Diagnosis not present

## 2017-01-28 DIAGNOSIS — I5022 Chronic systolic (congestive) heart failure: Secondary | ICD-10-CM | POA: Insufficient documentation

## 2017-01-28 DIAGNOSIS — Z79899 Other long term (current) drug therapy: Secondary | ICD-10-CM | POA: Insufficient documentation

## 2017-01-28 DIAGNOSIS — N4889 Other specified disorders of penis: Secondary | ICD-10-CM | POA: Diagnosis present

## 2017-01-28 MED ORDER — NYSTATIN-TRIAMCINOLONE 100000-0.1 UNIT/GM-% EX CREA
TOPICAL_CREAM | Freq: Two times a day (BID) | CUTANEOUS | Status: DC
  Filled 2017-01-28: qty 15

## 2017-01-28 MED ORDER — NYSTATIN-TRIAMCINOLONE 100000-0.1 UNIT/GM-% EX OINT
TOPICAL_OINTMENT | Freq: Two times a day (BID) | CUTANEOUS | Status: DC
  Administered 2017-01-28: 1 via TOPICAL
  Filled 2017-01-28: qty 15

## 2017-01-28 MED ORDER — NYSTATIN-TRIAMCINOLONE 100000-0.1 UNIT/GM-% EX OINT: 1.0000 "application " | TOPICAL_OINTMENT | Freq: Two times a day (BID) | CUTANEOUS | 0 refills | Status: DC

## 2017-01-28 NOTE — ED Triage Notes (Signed)
Per EMS report, patient comes from home with c/o of left groin pain for over two weeks. Patient is a dialysis patient who is compliant. Patient states pain is worse with ambulation and hasn't sought treatment due to being embarrassed.

## 2017-01-28 NOTE — ED Notes (Signed)
Family at bedside. 

## 2017-01-28 NOTE — ED Notes (Signed)
Patient placed on 3L O2 via [place] which is what he wears at home.

## 2017-01-28 NOTE — ED Provider Notes (Signed)
[facility] Emergency Department Provider Note   ____________________________________________    I have reviewed the triage vital signs and the nursing notes.   HISTORY  Chief Complaint Penis pain    HPI [name] is a 61 y.o. male who presents with pain in his penis. He notes over the last 2-3 weeks he has had worsening pain and burning starting at the tip of his penis and becoming more proximal. He reports severe pain when he tries to retract the foreskin of his penis. Denies fevers or chills. He has never had this before. No penile discharge. He has no concerns about STDs. He does have a history of end-stage renal disease and is compliant with dialysis. He also has peripheral vascular disease as detailed below   Past Medical History:  Diagnosis Date  . Amputation, traumatic, toes ([place])    Right Foot  . Amputee, below knee, left ([place])   . Anemia   . Asthma   . Cardiomyopathy ([place])   . CHF (congestive heart failure) ([place])   . Chronic systolic heart failure ([place])   . Complication of anesthesia    hypotension  . COPD (chronic obstructive pulmonary disease) ([place])   . Coronary artery disease   . Dialysis patient ([place])    Mon, Wed, Fri  . End stage renal disease ([place])   . GERD (gastroesophageal reflux disease)   . Headache   . History of kidney stones   . History of pulmonary embolism   . HLD (hyperlipidemia)   . HTN (hypertension)   . Hyperparathyroidism   . Myocardial infarction ([place])   . Peripheral vascular disease ([place])   . Shortness of breath dyspnea   . Sleep apnea    NO C-PAP, Patient stated in process of  "getting one"   . Tobacco dependence     Patient Active Problem List   Diagnosis Date Noted  . Acute on chronic systolic CHF (congestive heart failure) ([place]) 01/18/2017  . Respiratory failure with hypoxia ([place]) 11/11/2016  . Acute respiratory failure ([place]) 10/28/2016  . Pulmonary embolism ([place]) 08/03/2016  . NSTEMI  (non-ST elevated myocardial infarction) ([place]) 07/26/2016  . Acute respiratory failure with hypoxia ([place]) 05/24/2016  . Right lower lobe pneumonia ([place]) 05/24/2016  . Elevated troponin 05/24/2016  . Pulmonary edema 05/21/2016  . Kidney dialysis as the cause of abnormal reaction of the patient, or of later complication, without mention of misadventure at the time of the procedure (CODE) 03/05/2016  . PVD (peripheral vascular disease) ([place]) 03/05/2016  . Preop examination 05/02/2015  . Chronic systolic heart failure ([place])   . Sepsis ([place]) 01/13/2015  . Acute encephalopathy 01/13/2015  . Hyperkalemia 01/13/2015  . Gangrene of foot ([place]) 05/04/2014  . ESRD (end stage renal disease) on dialysis ([place]) 05/04/2014  . CAD (coronary artery disease) 05/04/2014  . Normocytic anemia 05/04/2014  . Hyperlipidemia 04/26/2010  . HYPERTENSION, BENIGN 04/26/2010  . CAD, NATIVE VESSEL 04/26/2010    Past Surgical History:  Procedure Laterality Date  . AMPUTATION Left 05/06/2014   Procedure: AMPUTATION BELOW KNEE;  Surgeon: Elam Dutch, MD;  Location: [place];  Service: Vascular;  Laterality: Left;  . AMPUTATION Right 01/12/2015   Procedure: Foot transmetatarsal amputation;  Surgeon: Algernon Huxley, MD;  Location: [facility];  Service: Vascular;  Laterality: Right;  . APPLICATION OF WOUND VAC Right 03/01/2015   Procedure: Application of Bio-connekt graft and wound vac application to right foot ;  Surgeon: Algernon Huxley, MD;  Location: [facility];  Service: Vascular;  Laterality: Right;  . AV FISTULA PLACEMENT Left   . AV FISTULA PLACEMENT Left 11/28/2016   Procedure: ARTERIOVENOUS (AV) FISTULA CREATION;  Surgeon: Algernon Huxley, MD;  Location: [facility];  Service: Vascular;  Laterality: Left;  . CARDIAC CATHETERIZATION     stent placement   . CORONARY ANGIOPLASTY    . DIALYSIS/PERMA CATHETER INSERTION N/A 07/31/2016   Procedure: Dialysis/Perma Catheter Insertion;  Surgeon: Algernon Huxley, MD;  Location: [place] [facility]  [facility];  Service: Cardiovascular;  Laterality: N/A;  . LIGATION OF ARTERIOVENOUS  FISTULA Right 01/31/2016   Procedure: LIGATION OF ARTERIOVENOUS  FISTULA;  Surgeon: Algernon Huxley, MD;  Location: [facility];  Service: Vascular;  Laterality: Right;  . PERIPHERAL VASCULAR CATHETERIZATION Right 12/15/2014   Procedure: Lower Extremity Angiography;  Surgeon: Algernon Huxley, MD;  Location: [place] CV LAB;  Service: Cardiovascular;  Laterality: Right;  . PERIPHERAL VASCULAR CATHETERIZATION  12/15/2014   Procedure: Lower Extremity Intervention;  Surgeon: Algernon Huxley, MD;  Location: [place] [facility];  Service: Cardiovascular;;  . PERIPHERAL VASCULAR CATHETERIZATION Right 08/14/2015   Procedure: A/V Shuntogram/Fistulagram;  Surgeon: Algernon Huxley, MD;  Location: [place] [facility];  Service: Cardiovascular;  Laterality: Right;  . PERIPHERAL VASCULAR CATHETERIZATION N/A 08/14/2015   Procedure: A/V Shunt Intervention;  Surgeon: Algernon Huxley, MD;  Location: [place] [facility];  Service: Cardiovascular;  Laterality: N/A;  . PERIPHERAL VASCULAR CATHETERIZATION N/A 01/11/2016   Procedure: Dialysis/Perma Catheter Insertion;  Surgeon: Algernon Huxley, MD;  Location: [place] [facility];  Service: Cardiovascular;  Laterality: N/A;  . REVISON OF ARTERIOVENOUS FISTULA Right 02/17/2016   Procedure: removal of AV fistula;  Surgeon: Serafina Mitchell, MD;  Location: [facility];  Service: Vascular;  Laterality: Right;  . REVISON OF ARTERIOVENOUS FISTULA Right 01/31/2016   Procedure: REVISON OF ARTERIOVENOUS FISTULA ( BRACHIOCEPHALIC ) W/ ARTEGRAFT;  Surgeon: Algernon Huxley, MD;  Location: [facility];  Service: Vascular;  Laterality: Right;  . TRANSMETATARSAL AMPUTATION Right 05/04/2015   Procedure: TRANSMETATARSAL AMPUTATION REVISION, great toe amputation;  Surgeon: Algernon Huxley, MD;  Location: [facility];  Service: Vascular;  Laterality: Right;    Prior to Admission medications   Medication Sig Start Date End Date Taking? Authorizing Provider   acetaminophen (TYLENOL) 500 MG tablet Take 1,000 mg by mouth every 6 (six) hours as needed. For discomfort    [provider]  albuterol (PROVENTIL HFA;VENTOLIN HFA) 108 (90 Base) MCG/ACT inhaler Inhale 2 puffs into the lungs every 4 (four) hours as needed for wheezing or shortness of breath.    [provider]  apixaban (ELIQUIS) 5 MG TABS tablet Take 1 tablet (5 mg total) by mouth 2 (two) times daily. 01/20/17   Gladstone Lighter, MD  atorvastatin (LIPITOR) 10 MG tablet Take 10 mg by mouth at bedtime.     [provider]  budesonide-formoterol (SYMBICORT) 160-4.5 MCG/ACT inhaler Inhale 2 puffs into the lungs 2 (two) times daily. Reported on 08/14/2015    [provider]  carvedilol (COREG) 3.125 MG tablet Take 1 tablet (3.125 mg total) by mouth 2 (two) times daily with a meal. 01/20/17   Gladstone Lighter, MD  cefUROXime (CEFTIN) 250 MG tablet Take 1 tablet (250 mg total) by mouth daily. X 7 days 01/21/17   Gladstone Lighter, MD  cefUROXime (CEFTIN) 250 MG tablet Take 1 tablet (250 mg total) by mouth every Monday, Wednesday, and Friday with hemodialysis. x7 days 01/22/17  Gladstone Lighter, MD  cetirizine (ZYRTEC) 10 MG tablet Take 10 mg by mouth daily.    [provider]  guaifenesin (ROBITUSSIN) 100 MG/5ML syrup Take 200 mg by mouth every 6 (six) hours as needed for cough.    [provider]  Ipratropium-Albuterol (COMBIVENT RESPIMAT) 20-100 MCG/ACT AERS respimat Inhale 1 puff into the lungs every 6 (six) hours as needed for wheezing or shortness of breath.    [provider]  menthol-cetylpyridinium (CEPACOL) 3 MG lozenge Take 1 lozenge (3 mg total) by mouth as needed for sore throat. 01/20/17   Gladstone Lighter, MD  midodrine (PROAMATINE) 10 MG tablet Take 5 mg by mouth 2 (two) times daily.    [provider]  Multiple Vitamin (MULTIVITAMIN WITH MINERALS) TABS tablet Take 1 tablet by mouth 2 (two) times daily.    [provider]  nitroGLYCERIN (NITROSTAT) 0.4 MG SL tablet Place 0.4 mg under the tongue every 5 (five) minutes as needed for chest pain.    [provider]  nystatin-triamcinolone ointment (MYCOLOG) Apply 1 application topically 2 (two) times daily. 01/28/17   Lavonia Drafts, MD  pantoprazole (PROTONIX) 20 MG tablet Take 20 mg by mouth daily.    [provider]  pregabalin (LYRICA) 75 MG capsule Take 1 capsule (75 mg total) by mouth daily. 01/22/17   Gladstone Lighter, MD  sevelamer carbonate (RENVELA) 800 MG tablet Take 800 mg by mouth daily.     [provider]  tiotropium (SPIRIVA) 18 MCG inhalation capsule Place 18 mcg into inhaler and inhale daily.    [provider]     Allergies Dust mite extract  Family History  Problem Relation Age of Onset  . Heart failure Other   . Hypertension Other   . Leukemia Other   . Diabetes Other     Social History Social History  Substance Use Topics  . Smoking status: Light Tobacco Smoker    Packs/day: 0.25    Types: Cigarettes  . Smokeless tobacco: Never Used     Comment: 5  . Alcohol use No    Review of Systems  Constitutional: No fever/chills Eyes: No visual changes.  ENT: No sore throat. Cardiovascular: Denies chest pain. Respiratory: Denies shortness of breath. Gastrointestinal: No abdominal pain.  No nausea, no vomiting.   Genitourinary: As above Musculoskeletal: Negative for back pain. Skin: Negative for rash. Neurological: Negative for headaches   ____________________________________________   PHYSICAL EXAM:  VITAL SIGNS: ED Triage Vitals  Enc Vitals Group     BP 01/28/17 1118 114/77     Pulse Rate 01/28/17 1118 82     Resp 01/28/17 1118 18     Temp 01/28/17 1118 98.7 F (37.1 C)     Temp Source 01/28/17 1118 Oral     SpO2 01/28/17 1118 100 %     Weight 01/28/17 1124 86.6 kg (191 lb)     Height 01/28/17 1124 1.88 m (6\' 2" )     Head Circumference --      Peak Flow --       Pain Score 01/28/17 1117 8     Pain Loc --      Pain Edu? --      Excl. in [place]? --     Constitutional: Alert and oriented. No acute distress. Pleasant and interactive   Nose: No congestion/rhinnorhea. Mouth/Throat: Mucous membranes are moist.   Neck:  Painless ROM Cardiovascular: Normal rate, regular rhythm.   Good peripheral circulation. Respiratory: Normal respiratory effort.  No retractions.  Gastrointestinal: Soft and nontender. No distention.  No CVA tenderness. Genitourinary: patient with thick yellow exudate to the glans of the penis, painful to touch. Able to retract the foreskin although quite painful. No erythema to the shaft of the penis. Scrotum normal. Perineum normal. Musculoskeletal:   Warm and well perfused Neurologic:  Normal speech and language. No gross focal neurologic deficits are appreciated.  Skin:  Skin is warm, dry and intact. No rash noted. Psychiatric: Mood and affect are normal. Speech and behavior are normal.  ____________________________________________   LABS (all labs ordered are listed, but only abnormal results are displayed)  Labs Reviewed - No data to display ____________________________________________  EKG  None ____________________________________________  RADIOLOGY  None ____________________________________________   PROCEDURES  Procedure(s) performed: No    Critical Care performed: No ____________________________________________   INITIAL IMPRESSION / ASSESSMENT AND PLAN / ED COURSE  Pertinent labs & imaging results that were available during my care of the patient were reviewed by me and considered in my medical decision making (see chart for details).  Discussed with Dr. Erlene Quan of urology, recommends Mycolog cream and outpatient follow-up.     ____________________________________________   FINAL CLINICAL IMPRESSION(S) / ED DIAGNOSES  Final diagnoses:  Balanitis      NEW MEDICATIONS STARTED DURING THIS  VISIT:  New Prescriptions   NYSTATIN-TRIAMCINOLONE OINTMENT (MYCOLOG)    Apply 1 application topically 2 (two) times daily.     Note:  This document was prepared using Dragon voice recognition software and may include unintentional dictation errors.    Lavonia Drafts, MD 01/28/17 [phone]

## 2017-01-28 NOTE — ED Notes (Signed)
Waiting for patient's sister to come pick him up with his oxygen prior to discharge.

## 2017-01-28 NOTE — Telephone Encounter (Signed)
Patient did not show for his [facility] appointment on 01/28/17. Will attempt to reschedule.

## 2017-01-28 NOTE — Discharge Instructions (Signed)
It is very important to follow up with Dr. Erlene Quan

## 2017-02-07 ENCOUNTER — Ambulatory Visit: Payer: Medicare Other | Admitting: Family

## 2017-02-10 ENCOUNTER — Emergency Department: Payer: Medicare Other

## 2017-02-10 ENCOUNTER — Emergency Department
Admission: EM | Admit: 2017-02-10 | Discharge: 2017-02-10 | Disposition: A | Discharge status: Home / Self Care | Payer: Medicare Other | Attending: Emergency Medicine | Admitting: Emergency Medicine

## 2017-02-10 DIAGNOSIS — Z79899 Other long term (current) drug therapy: Secondary | ICD-10-CM | POA: Insufficient documentation

## 2017-02-10 DIAGNOSIS — R1030 Lower abdominal pain, unspecified: Secondary | ICD-10-CM | POA: Diagnosis present

## 2017-02-10 DIAGNOSIS — I132 Hypertensive heart and chronic kidney disease with heart failure and with stage 5 chronic kidney disease, or end stage renal disease: Secondary | ICD-10-CM | POA: Diagnosis not present

## 2017-02-10 DIAGNOSIS — F17210 Nicotine dependence, cigarettes, uncomplicated: Secondary | ICD-10-CM | POA: Diagnosis not present

## 2017-02-10 DIAGNOSIS — I2510 Atherosclerotic heart disease of native coronary artery without angina pectoris: Secondary | ICD-10-CM | POA: Diagnosis not present

## 2017-02-10 DIAGNOSIS — J45909 Unspecified asthma, uncomplicated: Secondary | ICD-10-CM | POA: Diagnosis not present

## 2017-02-10 DIAGNOSIS — Z7901 Long term (current) use of anticoagulants: Secondary | ICD-10-CM | POA: Insufficient documentation

## 2017-02-10 DIAGNOSIS — I5022 Chronic systolic (congestive) heart failure: Secondary | ICD-10-CM | POA: Insufficient documentation

## 2017-02-10 DIAGNOSIS — J449 Chronic obstructive pulmonary disease, unspecified: Secondary | ICD-10-CM | POA: Diagnosis not present

## 2017-02-10 DIAGNOSIS — N186 End stage renal disease: Secondary | ICD-10-CM | POA: Diagnosis not present

## 2017-02-10 DIAGNOSIS — N481 Balanitis: Secondary | ICD-10-CM | POA: Insufficient documentation

## 2017-02-10 DIAGNOSIS — Z992 Dependence on renal dialysis: Secondary | ICD-10-CM | POA: Insufficient documentation

## 2017-02-10 LAB — COMPREHENSIVE METABOLIC PANEL
ALBUMIN: 2.4 g/dL — AB (ref 3.5–5.0)
ALK PHOS: 104 U/L (ref 38–126)
ALT: 30 U/L (ref 17–63)
ANION GAP: 9 (ref 5–15)
AST: 45 U/L — ABNORMAL HIGH (ref 15–41)
BILIRUBIN TOTAL: 0.9 mg/dL (ref 0.3–1.2)
BUN: 56 mg/dL — ABNORMAL HIGH (ref 6–20)
CALCIUM: 9 mg/dL (ref 8.9–10.3)
CO2: 25 mmol/L (ref 22–32)
CREATININE: 7.35 mg/dL — AB (ref 0.61–1.24)
Chloride: 102 mmol/L (ref 101–111)
GFR calc Af Amer: 8 mL/min — ABNORMAL LOW (ref >60)
GFR calc non Af Amer: 7 mL/min — ABNORMAL LOW (ref >60)
GLUCOSE: 95 mg/dL (ref 65–99)
Potassium: 4.9 mmol/L (ref 3.5–5.1)
Sodium: 136 mmol/L (ref 135–145)
Total Protein: 7.7 g/dL (ref 6.5–8.1)

## 2017-02-10 LAB — GRAM STAIN

## 2017-02-10 LAB — CBC WITH DIFFERENTIAL/PLATELET
BASOS ABS: 0.1 10*3/uL (ref 0–0.1)
Basophils Relative: 1 %
EOS PCT: 4 %
Eosinophils Absolute: 0.4 10*3/uL (ref 0–0.7)
HEMATOCRIT: 29.6 % — AB (ref 40.0–52.0)
Hemoglobin: 9.4 g/dL — ABNORMAL LOW (ref 13.0–18.0)
LYMPHS ABS: 1.2 10*3/uL (ref 1.0–3.6)
LYMPHS PCT: 12 %
MCH: 26.5 pg (ref 26.0–34.0)
MCHC: 31.8 g/dL — ABNORMAL LOW (ref 32.0–36.0)
MCV: 83.3 fL (ref 80.0–100.0)
MONO ABS: 1.2 10*3/uL — AB (ref 0.2–1.0)
Monocytes Relative: 11 %
NEUTROS ABS: 7.9 10*3/uL — AB (ref 1.4–6.5)
Neutrophils Relative %: 72 %
PLATELETS: 308 10*3/uL (ref 150–440)
RBC: 3.55 MIL/uL — AB (ref 4.40–5.90)
RDW: 17.7 % — ABNORMAL HIGH (ref 11.5–14.5)
WBC: 10.8 10*3/uL — ABNORMAL HIGH (ref 3.8–10.6)

## 2017-02-10 LAB — TROPONIN I
TROPONIN I: 0.15 ng/mL — AB (ref <0.03)
TROPONIN I: 0.14 ng/mL — AB (ref <0.03)

## 2017-02-10 LAB — BRAIN NATRIURETIC PEPTIDE: B NATRIURETIC PEPTIDE 5: 4123 pg/mL — AB (ref 0.0–100.0)

## 2017-02-10 MED ORDER — CEFTRIAXONE SODIUM IN DEXTROSE 20 MG/ML IV SOLN
1.0000 g | Freq: Once | INTRAVENOUS | Status: AC
  Administered 2017-02-10: 1 g via INTRAVENOUS
  Filled 2017-02-10: qty 50

## 2017-02-10 NOTE — ED Provider Notes (Signed)
[facility] Emergency Department Provider Note   ____________________________________________   First MD Initiated Contact with Patient 02/10/17 1218     (approximate)  I have reviewed the triage vital signs and the nursing notes.   HISTORY  Chief Complaint Groin Pain    HPI AMAURI KEEFE is a 61 y.o. male Comes in complaining of burning whem when she says he is using. He also is very O2 sats were in the 70s. He was  Emergency room placed on oxygen an   Past Medical History:  Diagnosis Date  . 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 Amputee, below knee, left ([place])   . Anemia   . Asthma   . Cardiomyopathy ([place])   . CHF (congestive heart failure) ([place])   . Chronic systolic heart failure ([place])   . Complication of anesthesia    hypotension  . COPD (chronic obstructive pulmonary disease) ([place])   . Coronary artery disease   . Dialysis patient ([place])    Mon, Wed, Fri  . End stage renal disease ([place])   . GERD (gastroesophageal reflux disease)   . Headache   . History of kidney stones   . History of pulmonary embolism   . HLD (hyperlipidemia)   . HTN (hypertension)   . Hyperparathyroidism   . Myocardial infarction ([place])   . Peripheral vascular disease ([place])   . Shortness of breath dyspnea   . Sleep apnea    NO C-PAP, Patient stated in process of  "getting one"   . Tobacco dependence     Patient Active Problem List   Diagnosis Date Noted  . Acute on chronic systolic CHF (congestive heart failure) ([place]) 01/18/2017  . Respiratory failure with hypoxia ([place]) 11/11/2016  . Acute respiratory failure ([place]) 10/28/2016  . Pulmonary embolism ([place]) 08/03/2016  . NSTEMI (non-ST elevated myocardial infarction) ([place]) 07/26/2016  . Acute respiratory failure with hypoxia ([place]) 05/24/2016  . Right lower lobe pneumonia ([place]) 05/24/2016  . Elevated troponin 05/24/2016  . Pulmonary edema 05/21/2016  . Kidney dialysis as the cause of abnormal  reaction of the patient, or of later complication, without mention of misadventure at the time of the procedure (CODE) 03/05/2016  . PVD (peripheral vascular disease) ([place]) 03/05/2016  . Preop examination 05/02/2015  . Chronic systolic heart failure ([place])   . Sepsis ([place]) 01/13/2015  . Acute encephalopathy 01/13/2015  . Hyperkalemia 01/13/2015  . Gangrene of foot ([place]) 05/04/2014  . ESRD (end stage renal disease) on dialysis ([place]) 05/04/2014  . CAD (coronary artery disease) 05/04/2014  . Normocytic anemia 05/04/2014  . Hyperlipidemia 04/26/2010  . HYPERTENSION, BENIGN 04/26/2010  . CAD, NATIVE VESSEL 04/26/2010    Past Surgical History:  Procedure Laterality Date  . AMPUTATION Left 05/06/2014   Procedure: AMPUTATION BELOW KNEE;  Surgeon: Elam Dutch, MD;  Location: [place];  Service: Vascular;  Laterality: Left;  . AMPUTATION Right 01/12/2015   Procedure: Foot transmetatarsal amputation;  Surgeon: Algernon Huxley, MD;  Location: [facility];  Service: Vascular;  Laterality: Right;  . APPLICATION OF WOUND VAC Right 03/01/2015   Procedure: Application of Bio-connekt graft and wound vac application to right foot ;  Surgeon: Algernon Huxley, MD;  Location: [facility];  Service: Vascular;  Laterality: Right;  . AV FISTULA PLACEMENT Left   . AV FISTULA PLACEMENT Left 11/28/2016   Procedure: ARTERIOVENOUS (AV) FISTULA CREATION;  Surgeon: Algernon Huxley, MD;  Location: [facility];  Service: Vascular;  Laterality: Left;  . CARDIAC CATHETERIZATION     stent placement   . CORONARY ANGIOPLASTY    . DIALYSIS/PERMA CATHETER INSERTION N/A 07/31/2016   Procedure: Dialysis/Perma Catheter Insertion;  Surgeon: Algernon Huxley, MD;  Location: [place] [facility];  Service: Cardiovascular;  Laterality: N/A;  . LIGATION OF ARTERIOVENOUS  FISTULA Right 01/31/2016   Procedure: LIGATION OF ARTERIOVENOUS  FISTULA;  Surgeon: Algernon Huxley, MD;  Location: [facility];  Service: Vascular;  Laterality: Right;  . PERIPHERAL VASCULAR  CATHETERIZATION Right 12/15/2014   Procedure: Lower Extremity Angiography;  Surgeon: Algernon Huxley, MD;  Location: [place] [facility];  Service: Cardiovascular;  Laterality: Right;  . PERIPHERAL VASCULAR CATHETERIZATION  12/15/2014   Procedure: Lower Extremity Intervention;  Surgeon: Algernon Huxley, MD;  Location: [place] [facility];  Service: Cardiovascular;;  . PERIPHERAL VASCULAR CATHETERIZATION Right 08/14/2015   Procedure: A/V Shuntogram/Fistulagram;  Surgeon: Algernon Huxley, MD;  Location: [place] [facility];  Service: Cardiovascular;  Laterality: Right;  . PERIPHERAL VASCULAR CATHETERIZATION N/A 08/14/2015   Procedure: A/V Shunt Intervention;  Surgeon: Algernon Huxley, MD;  Location: [place] [facility];  Service: Cardiovascular;  Laterality: N/A;  . PERIPHERAL VASCULAR CATHETERIZATION N/A 01/11/2016   Procedure: Dialysis/Perma Catheter Insertion;  Surgeon: Algernon Huxley, MD;  Location: [place] [facility];  Service: Cardiovascular;  Laterality: N/A;  . REVISON OF ARTERIOVENOUS FISTULA Right 02/17/2016   Procedure: removal of AV fistula;  Surgeon: Serafina Mitchell, MD;  Location: [facility];  Service: Vascular;  Laterality: Right;  . REVISON OF ARTERIOVENOUS FISTULA Right 01/31/2016   Procedure: REVISON OF ARTERIOVENOUS FISTULA ( BRACHIOCEPHALIC ) W/ ARTEGRAFT;  Surgeon: Algernon Huxley, MD;  Location: [facility];  Service: Vascular;  Laterality: Right;  . TRANSMETATARSAL AMPUTATION Right 05/04/2015   Procedure: TRANSMETATARSAL AMPUTATION REVISION, great toe amputation;  Surgeon: Algernon Huxley, MD;  Location: [facility];  Service: Vascular;  Laterality: Right;    Prior to Admission medications   Medication Sig Start Date End Date Taking? Authorizing Provider  apixaban (ELIQUIS) 5 MG TABS tablet Take 1 tablet (5 mg total) by mouth 2 (two) times daily. 01/20/17  Yes Gladstone Lighter, MD  atorvastatin (LIPITOR) 10 MG tablet Take 10 mg by mouth at bedtime.    Yes [provider]  budesonide-formoterol  (SYMBICORT) 160-4.5 MCG/ACT inhaler Inhale 2 puffs into the lungs 2 (two) times daily. Reported on 08/14/2015   Yes [provider]  carvedilol (COREG) 3.125 MG tablet Take 1 tablet (3.125 mg total) by mouth 2 (two) times daily with a meal. 01/20/17  Yes Gladstone Lighter, MD  cefUROXime (CEFTIN) 250 MG tablet Take 1 tablet (250 mg total) by mouth every Monday, Wednesday, and Friday with hemodialysis. x7 days Patient taking differently: Take 250 mg by mouth Every Tuesday,Thursday,and Saturday with dialysis. x7 days 01/22/17  Yes Gladstone Lighter, MD  cetirizine (ZYRTEC) 10 MG tablet Take 10 mg by mouth daily.   Yes [provider]  guaifenesin (ROBITUSSIN) 100 MG/5ML syrup Take 200 mg by mouth every 6 (six) hours as needed for cough.   Yes [provider]  Ipratropium-Albuterol (COMBIVENT RESPIMAT) 20-100 MCG/ACT AERS respimat Inhale 1 puff into the lungs every 6 (six) hours as needed for wheezing or shortness of breath.   Yes [provider]  menthol-cetylpyridinium (CEPACOL) 3 MG lozenge Take 1 lozenge (3 mg total) by mouth as needed for sore throat. 01/20/17  Yes Gladstone Lighter, MD  Multiple Vitamin (MULTIVITAMIN WITH MINERALS) TABS  tablet Take 1 tablet by mouth 2 (two) times daily.   Yes [provider]  nitroGLYCERIN (NITROSTAT) 0.4 MG SL tablet Place 0.4 mg under the tongue every 5 (five) minutes as needed for chest pain.   Yes [provider]  nystatin-triamcinolone ointment (MYCOLOG) Apply 1 application topically 2 (two) times daily. 01/28/17  Yes Lavonia Drafts, MD  pantoprazole (PROTONIX) 20 MG tablet Take 20 mg by mouth daily.   Yes [provider]  pregabalin (LYRICA) 75 MG capsule Take 1 capsule (75 mg total) by mouth daily. 01/22/17  Yes Gladstone Lighter, MD  sevelamer carbonate (RENVELA) 800 MG tablet Take 800 mg by mouth 2 (two) times daily.    Yes [provider]  tiotropium (SPIRIVA) 18 MCG inhalation capsule  Place 18 mcg into inhaler and inhale daily.   Yes [provider]  cefUROXime (CEFTIN) 250 MG tablet Take 1 tablet (250 mg total) by mouth daily. X 7 days Patient not taking: Reported on 02/10/2017 01/21/17   Gladstone Lighter, MD  midodrine (PROAMATINE) 10 MG tablet Take 5 mg by mouth 2 (two) times daily.    [provider]    Allergies Dust mite extract  Family History  Problem Relation Age of Onset  . Heart failure Other   . Hypertension Other   . Leukemia Other   . Diabetes Other     Social History Social History  Substance Use Topics  . Smoking status: Light Tobacco Smoker    Packs/day: 0.25    Types: Cigarettes  . Smokeless tobacco: Never Used     Comment: 5  . Alcohol use No    Review of Systems  Constitutional: No fever/chillsdoes feel cold Eyes: No visual changes. ENT: No sore throat. Cardiovascular: Denies chest pain. Respiratory:shortness of breath. Gastrointestinal: No abdominal pain.  No nausea, no vomiting.  No diarrhea.  No constipation. Genitourinary:  dysuria. Musculoskeletal: Negative for back pain. Skin: Negative for rash. Neurological: Negative for headaches, focal weakness   ____________________________________________   PHYSICAL EXAM:  VITAL SIGNS: ED Triage Vitals [02/10/17 1157]  Enc Vitals Group     BP 104/70     Pulse Rate 75     Resp 20     Temp 97.7 F (36.5 C)     Temp Source Oral     SpO2 100 %     Weight 182 lb (82.6 kg)     Height 6\' 2"  (1.88 m)     Head Circumference      Peak Flow      Pain Score 10     Pain Loc      Pain Edu?      Excl. in [place]?     Constitutional: Alert and oriented. Short of breath when he gets on the Eyes: Conjunctivae are normal. Head: Atraumatic. Nose: No congestion/rhinnorhea. Mouth/Throat: Mucous membranes are moist.  Oropharynx non-erythematous. Neck: No stridor.  Cardiovascular: Normal rate, regular rhythm. Grossly normal heart sounds.  Good peripheral  circulation. Respiratory: Normal respiratory effort.  No retractions. Lungs CTAB.after he's been sitting on the stre Gastrointestinal: Soft and nontender. No distention. No abdominal bruits. No CVA tenderness. Genitourinary:uncircumcised male there is a white necrotic looking rash around the ti coming from it. The foreskin is not wanting to re opening is very tender. It is not s  Musculoskeletal: No lower extremity tenderness nor edema.  No joint effusions. Neurologic:  Normal speech and language. No gross focal neurologic deficits are appreciated. No gait instability. Skin:  Skin is  warm, dry and intact. No rash noted. Psychiatric: Mood and affect are normal. Speech and behavior are normal.  ____________________________________________   LABS (all labs ordered are listed, but only abnormal results are displayed)  Labs Reviewed  COMPREHENSIVE METABOLIC PANEL - Abnormal; Notable for the following:       Result Value   BUN 56 (*)    Creatinine, Ser 7.35 (*)    Albumin 2.4 (*)    AST 45 (*)    GFR calc non Af Amer 7 (*)    GFR calc Af Amer 8 (*)    All other components within normal limits  CBC WITH DIFFERENTIAL/PLATELET - Abnormal; Notable for the following:    WBC 10.8 (*)    RBC 3.55 (*)    Hemoglobin 9.4 (*)    HCT 29.6 (*)    MCHC 31.8 (*)    RDW 17.7 (*)    Neutro Abs 7.9 (*)    Monocytes Absolute 1.2 (*)    All other components within normal limits  TROPONIN I - Abnormal; Notable for the following:    Troponin I 0.15 (*)    All other components within normal limits  BRAIN NATRIURETIC PEPTIDE - Abnormal; Notable for the following:    B Natriuretic Peptide 4,123.0 (*)    All other components within normal limits  TROPONIN I - Abnormal; Notable for the following:    Troponin I 0.14 (*)    All other components within normal limits  GRAM STAIN   ____________________________________________  EKG  EKG read and interpreted by me sh flipped T waves in 1 and L are be5 and  V6 which may be due to lead placement V4 looks similar to previously ____________________________________________  RADIOLOGY  IMPRESSION: Cardiomegaly with mild pulmonary vascular congestion.   Electronically Signed   By: Kathreen Devoid   On: 02/10/2017 13:02 ____________________________________________   PROCEDURES  Procedure(s) performed:  Procedures  Critical Care performed:   ____________________________________________   INITIAL IMPRESSION / ASSESSMENT AND PLAN / ED COURSE  Pertinent labs & imaging results that were available during my care of the patient were reviewed by me and considered in my medical decision making (see chart for details).    Gram stain showed  gram-negativei diplococci will give Rocephin in casepatient to follow-up with urology ____________________________________________   FINAL CLINICAL IMPRESSION(S) / ED DIAGNOSES  Final diagnoses:  Balanitis      NEW MEDICATIONS STARTED DURING THIS VISIT:  Discharge Medication List as of 02/10/2017  5:28 PM       Note:  This document was prepared using Dragon voice recognition software and may include unintentional dictation errors.    Nena Polio, MD 02/10/17 [phone]

## 2017-02-10 NOTE — Discharge Instructions (Signed)
Please call the urologist at the phone number I supplied you.Get a follow-up appointment.she will be able to manage this problem more thoroughly.

## 2017-02-10 NOTE — ED Triage Notes (Signed)
Pt presents to ED for L groin pain. Pt states it's been "burning for months." denies burning with urination. Pt denies SOB, CP. Pt states he is supposed to be on oxygen 2-3L at all times, arrived without oxygen. Pt states dialysis MWF, got full treatment. Pt dialysis fistula in L arm. Fistula is out of R arm. Placed on oxygen in triage. Pt appears sleepy, states he takes medicine that makes him sleepy.

## 2017-02-10 NOTE — ED Notes (Signed)
Spoke with lab regarding collection of GC/Chlamydia since patient does not make urine. Lab informed this RN that they do not have collection container needed for male swab. Reports they will be able to run gram stain collection. This RN requested lab to speak with MD about alternative testing. Dr. Corky Downs spoke with lab and will inform Dr. Cinda Quest that collection is not possible at this time.

## 2017-02-11 ENCOUNTER — Ambulatory Visit: Payer: Medicare Other | Admitting: Family

## 2017-02-11 ENCOUNTER — Telehealth: Payer: Self-pay | Admitting: Family

## 2017-02-11 NOTE — Telephone Encounter (Signed)
Patient missed his initial appointment at the [facility] on 02/11/17. Will attempt to reschedule.  Of note, this is the 2nd appointment that he has missed.

## 2017-02-14 ENCOUNTER — Emergency Department
Admission: EM | Admit: 2017-02-14 | Discharge: 2017-02-14 | Disposition: A | Discharge status: Home / Self Care | Payer: Medicare Other | Attending: Emergency Medicine | Admitting: Emergency Medicine

## 2017-02-14 ENCOUNTER — Encounter: Payer: Self-pay | Admitting: Emergency Medicine

## 2017-02-14 DIAGNOSIS — R1030 Lower abdominal pain, unspecified: Secondary | ICD-10-CM | POA: Diagnosis present

## 2017-02-14 DIAGNOSIS — F17200 Nicotine dependence, unspecified, uncomplicated: Secondary | ICD-10-CM | POA: Diagnosis not present

## 2017-02-14 DIAGNOSIS — I119 Hypertensive heart disease without heart failure: Secondary | ICD-10-CM | POA: Insufficient documentation

## 2017-02-14 DIAGNOSIS — J449 Chronic obstructive pulmonary disease, unspecified: Secondary | ICD-10-CM | POA: Diagnosis not present

## 2017-02-14 DIAGNOSIS — Z79899 Other long term (current) drug therapy: Secondary | ICD-10-CM | POA: Diagnosis not present

## 2017-02-14 DIAGNOSIS — I2510 Atherosclerotic heart disease of native coronary artery without angina pectoris: Secondary | ICD-10-CM | POA: Insufficient documentation

## 2017-02-14 DIAGNOSIS — N481 Balanitis: Secondary | ICD-10-CM | POA: Insufficient documentation

## 2017-02-14 MED ORDER — FLUCONAZOLE 50 MG PO TABS
150.0000 mg | ORAL_TABLET | Freq: Once | ORAL | Status: AC
  Administered 2017-02-14: 14:00:00 150 mg via ORAL
  Filled 2017-02-14: qty 1

## 2017-02-14 MED ORDER — TRAMADOL HCL 50 MG PO TABS
50.0000 mg | ORAL_TABLET | Freq: Once | ORAL | Status: AC
  Administered 2017-02-14: 50 mg via ORAL
  Filled 2017-02-14: qty 1

## 2017-02-14 MED ORDER — TRAMADOL HCL 50 MG PO TABS: 50.0000 mg | ORAL_TABLET | Freq: Four times a day (QID) | ORAL | 0 refills | Status: DC | PRN

## 2017-02-14 MED ORDER — NYSTATIN 100000 UNIT/GM EX OINT: 1.0000 "application " | TOPICAL_OINTMENT | Freq: Two times a day (BID) | CUTANEOUS | 0 refills | Status: DC

## 2017-02-14 NOTE — ED Triage Notes (Signed)
Patient presents to ED via POV from home with c/o penile pain "for weeks". Patient states he was recently dx with a yeast infection. Patient states he was sent home with "pills and cream", unable to name the medications. Patient states, "I can't get any relief". Patient reports burning pain. Denies any swelling or discharge.

## 2017-02-14 NOTE — Discharge Instructions (Signed)
Call urology clinic at 8:30 Monday morning to schedule appointment. Continue previous medications and use ointment as directed.

## 2017-02-14 NOTE — ED Provider Notes (Signed)
[facility] Emergency Department Provider Note   ____________________________________________   First MD Initiated Contact with Patient 02/14/17 1318     (approximate)  I have reviewed the triage vital signs and the nursing notes.   HISTORY  Chief Complaint Groin Pain    HPI [name] is a 61 y.o. male patient presents for follow-up of the Balanitis from previous ER visit 4 days ago. Patient he was discharged prescription for cream to apply to the area. Patient states his PCP and was told to discontinue the cream and given prescription for 1 pill and another prescription he was advised takes twice a day. Patient states no relief taking the medications. Patient did not contact urologist as directed.  Past Medical History:  Diagnosis Date  . 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 Amputee, below knee, left ([place])   . Anemia   . Asthma   . Cardiomyopathy ([place])   . CHF (congestive heart failure) ([place])   . Chronic systolic heart failure ([place])   . Complication of anesthesia    hypotension  . COPD (chronic obstructive pulmonary disease) ([place])   . Coronary artery disease   . Dialysis patient ([place])    Mon, Wed, Fri  . End stage renal disease ([place])   . GERD (gastroesophageal reflux disease)   . Headache   . History of kidney stones   . History of pulmonary embolism   . HLD (hyperlipidemia)   . HTN (hypertension)   . Hyperparathyroidism   . Myocardial infarction ([place])   . Peripheral vascular disease ([place])   . Shortness of breath dyspnea   . Sleep apnea    NO C-PAP, Patient stated in process of  "getting one"   . Tobacco dependence     Patient Active Problem List   Diagnosis Date Noted  . Acute on chronic systolic CHF (congestive heart failure) ([place]) 01/18/2017  . Respiratory failure with hypoxia ([place]) 11/11/2016  . Acute respiratory failure ([place]) 10/28/2016  . Pulmonary embolism ([place]) 08/03/2016  . NSTEMI (non-ST elevated  myocardial infarction) ([place]) 07/26/2016  . Acute respiratory failure with hypoxia ([place]) 05/24/2016  . Right lower lobe pneumonia ([place]) 05/24/2016  . Elevated troponin 05/24/2016  . Pulmonary edema 05/21/2016  . Kidney dialysis as the cause of abnormal reaction of the patient, or of later complication, without mention of misadventure at the time of the procedure (CODE) 03/05/2016  . PVD (peripheral vascular disease) ([place]) 03/05/2016  . Preop examination 05/02/2015  . Chronic systolic heart failure ([place])   . Sepsis ([place]) 01/13/2015  . Acute encephalopathy 01/13/2015  . Hyperkalemia 01/13/2015  . Gangrene of foot ([place]) 05/04/2014  . ESRD (end stage renal disease) on dialysis ([place]) 05/04/2014  . CAD (coronary artery disease) 05/04/2014  . Normocytic anemia 05/04/2014  . Hyperlipidemia 04/26/2010  . HYPERTENSION, BENIGN 04/26/2010  . CAD, NATIVE VESSEL 04/26/2010    Past Surgical History:  Procedure Laterality Date  . AMPUTATION Left 05/06/2014   Procedure: AMPUTATION BELOW KNEE;  Surgeon: Elam Dutch, MD;  Location: [place];  Service: Vascular;  Laterality: Left;  . AMPUTATION Right 01/12/2015   Procedure: Foot transmetatarsal amputation;  Surgeon: Algernon Huxley, MD;  Location: [facility];  Service: Vascular;  Laterality: Right;  . APPLICATION OF WOUND VAC Right 03/01/2015   Procedure: Application of Bio-connekt graft and wound vac application to right foot ;  Surgeon: Algernon Huxley, MD;  Location: [facility];  Service: Vascular;  Laterality: Right;  .  AV FISTULA PLACEMENT Left   . AV FISTULA PLACEMENT Left 11/28/2016   Procedure: ARTERIOVENOUS (AV) FISTULA CREATION;  Surgeon: Algernon Huxley, MD;  Location: [facility];  Service: Vascular;  Laterality: Left;  . CARDIAC CATHETERIZATION     stent placement   . CORONARY ANGIOPLASTY    . DIALYSIS/PERMA CATHETER INSERTION N/A 07/31/2016   Procedure: Dialysis/Perma Catheter Insertion;  Surgeon: Algernon Huxley, MD;  Location: [place];  Service:  Cardiovascular;  Laterality: N/A;  . LIGATION OF ARTERIOVENOUS  FISTULA Right 01/31/2016   Procedure: LIGATION OF ARTERIOVENOUS  FISTULA;  Surgeon: Algernon Huxley, MD;  Location: [facility];  Service: Vascular;  Laterality: Right;  . PERIPHERAL VASCULAR CATHETERIZATION Right 12/15/2014   Procedure: Lower Extremity Angiography;  Surgeon: Algernon Huxley, MD;  Location: [place] [facility];  Service: Cardiovascular;  Laterality: Right;  . PERIPHERAL VASCULAR CATHETERIZATION  12/15/2014   Procedure: Lower Extremity Intervention;  Surgeon: Algernon Huxley, MD;  Location: [place] [facility];  Service: Cardiovascular;;  . PERIPHERAL VASCULAR CATHETERIZATION Right 08/14/2015   Procedure: A/V Shuntogram/Fistulagram;  Surgeon: Algernon Huxley, MD;  Location: [place] [facility];  Service: Cardiovascular;  Laterality: Right;  . PERIPHERAL VASCULAR CATHETERIZATION N/A 08/14/2015   Procedure: A/V Shunt Intervention;  Surgeon: Algernon Huxley, MD;  Location: [place] [facility];  Service: Cardiovascular;  Laterality: N/A;  . PERIPHERAL VASCULAR CATHETERIZATION N/A 01/11/2016   Procedure: Dialysis/Perma Catheter Insertion;  Surgeon: Algernon Huxley, MD;  Location: [facility];  Service: Cardiovascular;  Laterality: N/A;  . REVISON OF ARTERIOVENOUS FISTULA Right 02/17/2016   Procedure: removal of AV fistula;  Surgeon: Serafina Mitchell, MD;  Location: [facility];  Service: Vascular;  Laterality: Right;  . REVISON OF ARTERIOVENOUS FISTULA Right 01/31/2016   Procedure: REVISON OF ARTERIOVENOUS FISTULA ( BRACHIOCEPHALIC ) W/ ARTEGRAFT;  Surgeon: Algernon Huxley, MD;  Location: [facility];  Service: Vascular;  Laterality: Right;  . TRANSMETATARSAL AMPUTATION Right 05/04/2015   Procedure: TRANSMETATARSAL AMPUTATION REVISION, great toe amputation;  Surgeon: Algernon Huxley, MD;  Location: [facility];  Service: Vascular;  Laterality: Right;    Prior to Admission medications   Medication Sig Start Date End Date Taking? Authorizing Provider  apixaban  (ELIQUIS) 5 MG TABS tablet Take 1 tablet (5 mg total) by mouth 2 (two) times daily. 01/20/17   Gladstone Lighter, MD  atorvastatin (LIPITOR) 10 MG tablet Take 10 mg by mouth at bedtime.     [provider]  budesonide-formoterol (SYMBICORT) 160-4.5 MCG/ACT inhaler Inhale 2 puffs into the lungs 2 (two) times daily. Reported on 08/14/2015    [provider]  carvedilol (COREG) 3.125 MG tablet Take 1 tablet (3.125 mg total) by mouth 2 (two) times daily with a meal. 01/20/17   Gladstone Lighter, MD  cefUROXime (CEFTIN) 250 MG tablet Take 1 tablet (250 mg total) by mouth daily. X 7 days Patient not taking: Reported on 02/10/2017 01/21/17   Gladstone Lighter, MD  cefUROXime (CEFTIN) 250 MG tablet Take 1 tablet (250 mg total) by mouth every Monday, Wednesday, and Friday with hemodialysis. x7 days Patient taking differently: Take 250 mg by mouth Every Tuesday,Thursday,and Saturday with dialysis. x7 days 01/22/17   Gladstone Lighter, MD  cetirizine (ZYRTEC) 10 MG tablet Take 10 mg by mouth daily.    [provider]  guaifenesin (ROBITUSSIN) 100 MG/5ML syrup Take 200 mg by mouth every 6 (six) hours as needed for cough.    [provider]  Ipratropium-Albuterol (COMBIVENT RESPIMAT) 20-100 MCG/ACT AERS respimat Inhale 1 puff into the lungs every 6 (six) hours as needed for wheezing or shortness of breath.    [provider]  menthol-cetylpyridinium (CEPACOL) 3 MG lozenge Take 1 lozenge (3 mg total) by mouth as needed for sore throat. 01/20/17   Gladstone Lighter, MD  midodrine (PROAMATINE) 10 MG tablet Take 5 mg by mouth 2 (two) times daily.    [provider]  Multiple Vitamin (MULTIVITAMIN WITH MINERALS) TABS tablet Take 1 tablet by mouth 2 (two) times daily.    [provider]  nitroGLYCERIN (NITROSTAT) 0.4 MG SL tablet Place 0.4 mg under the tongue every 5 (five) minutes as needed for chest pain.    [provider]  nystatin ointment  (MYCOSTATIN) Apply 1 application topically 2 (two) times daily. 02/14/17   Sable Feil, PA-C  nystatin-triamcinolone ointment [facility]) Apply 1 application topically 2 (two) times daily. 01/28/17   Lavonia Drafts, MD  pantoprazole (PROTONIX) 20 MG tablet Take 20 mg by mouth daily.    [provider]  pregabalin (LYRICA) 75 MG capsule Take 1 capsule (75 mg total) by mouth daily. 01/22/17   Gladstone Lighter, MD  sevelamer carbonate (RENVELA) 800 MG tablet Take 800 mg by mouth 2 (two) times daily.     [provider]  tiotropium (SPIRIVA) 18 MCG inhalation capsule Place 18 mcg into inhaler and inhale daily.    [provider]  traMADol (ULTRAM) 50 MG tablet Take 1 tablet (50 mg total) by mouth every 6 (six) hours as needed for moderate pain. 02/14/17   Sable Feil, PA-C    Allergies Dust mite extract  Family History  Problem Relation Age of Onset  . Heart failure Other   . Hypertension Other   . Leukemia Other   . Diabetes Other     Social History Social History  Substance Use Topics  . Smoking status: Light Tobacco Smoker    Packs/day: 0.25    Types: Cigarettes  . Smokeless tobacco: Never Used     Comment: 5  . Alcohol use No    Review of Systems  Constitutional: No fever/chills Eyes: No visual changes. ENT: No sore throat. Cardiovascular: Denies chest pain. Respiratory: Denies shortness of breath. Gastrointestinal: No abdominal pain.  No nausea, no vomiting.  No diarrhea.  No constipation. Genitourinary: penile pain/edema. Musculoskeletal: Negative for back pain. Skin: Negative for rash. Neurological: Negative for headaches, focal weakness or numbness. Endocrine:Hypertension Hematological/Lymphatic:Acute Allergic/Immunilogical: Dust mites ____________________________________________   PHYSICAL EXAM:  VITAL SIGNS: ED Triage Vitals  Enc Vitals Group     BP 02/14/17 1304 115/76     Pulse Rate 02/14/17 1304 64     Resp 02/14/17 1304  15     Temp 02/14/17 1304 97.7 F (36.5 C)     Temp Source 02/14/17 1304 Oral     SpO2 02/14/17 1304 99 %     Weight 02/14/17 1304 182 lb (82.6 kg)     Height 02/14/17 1304 6\' 2"  (1.88 m)     Head Circumference --      Peak Flow --      Pain Score 02/14/17 1303 10     Pain Loc --      Pain Edu? --      Excl. in [place]? --     Constitutional: Alert and oriented. Well appearing and in no acute distress. Cardiovascular: Normal rate, regular rhythm. Grossly normal heart sounds.  Good peripheral circulation. Respiratory: Normal respiratory  effort.  No retractions. Lungs CTAB. Gastrointestinal: Soft and nontender. No distention. No abdominal bruits. No CVA tenderness. Genitourinary: Uncircumcised male. Edematous penile glan. Foreskin is difficult to retract this time. Distal foreskin is edematous with dry secretions. Musculoskeletal: No lower extremity tenderness nor edema.  No joint effusions. Neurologic:  Normal speech and language. No gross focal neurologic deficits are appreciated. No gait instability. Skin:  Skin is warm, dry and intact. No rash noted. Psychiatric: Mood and affect are normal. Speech and behavior are normal.  ____________________________________________   LABS (all labs ordered are listed, but only abnormal results are displayed)  Labs Reviewed - No data to display ____________________________________________  EKG   ____________________________________________  RADIOLOGY  No results found.  ____________________________________________   PROCEDURES  Procedure(s) performed:   Procedures  Critical Care performed: No  ____________________________________________   INITIAL IMPRESSION / ASSESSMENT AND PLAN / ED COURSE  Pertinent labs & imaging results that were available during my care of the patient were reviewed by me and considered in my medical decision making (see chart for details).  Penis pain secondary to Balanitis. Contact the pharmacy and  found out the patient was prescribed 1 Diflucan tablet and doxycycline. Advised patient to continue doxycycline and given 150 mg of Diflucan prior to departure. Patient advised to contact urologist Monday morning to schedule appointment.    ____________________________________________   FINAL CLINICAL IMPRESSION(S) / ED DIAGNOSES  Final diagnoses:  Balanitis      NEW MEDICATIONS STARTED DURING THIS VISIT:  Discharge Medication List as of 02/14/2017  1:49 PM    START taking these medications   Details  nystatin ointment (MYCOSTATIN) Apply 1 application topically 2 (two) times daily., Starting Fri 02/14/2017, Print    traMADol (ULTRAM) 50 MG tablet Take 1 tablet (50 mg total) by mouth every 6 (six) hours as needed for moderate pain., Starting Fri 02/14/2017, Print         Note:  This document was prepared using Dragon voice recognition software and may include unintentional dictation errors.    Sable Feil, PA-C 02/14/17 1406    Nance Pear, MD 02/14/17 [phone]

## 2017-02-14 NOTE — ED Notes (Signed)
See triage note  States he was seen  About a week ago for possible yeast to groin/ penis area  States he is not any better

## 2017-02-21 NOTE — ED Provider Notes (Signed)
[facility] Emergency Department Provider Note   ____________________________________________   First MD Initiated Contact with Patient 02/10/17 1218     (approximate)  I have reviewed the triage vital signs and the nursing notes.   HISTORY  Chief Complaint Groin Pain   HPI [name] is a 61 y.o. male patient was seen in the emergency room in fast track fast track provider consult with urology and was told to give the patient some cream for presumed balanitis. Patient complains of increasing pain in his penis has some swelling there he is not circumcised to does not have phimosis or paraphimosis he does have a very bad discharge from under the foreskin which is pussy and there is appears to be some exophytic lesion right at the tip of the penis.Gram stain of the discharge showedmany gram-negative diplococci. Patient is given Rocephin for this. patient's troponin was mildly elevated but decreased during his ER visit. patient was not short of breath.   Past Medical History:  Diagnosis Date  . 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 Amputee, below knee, left ([place])   . Anemia   . Asthma   . Cardiomyopathy ([place])   . CHF (congestive heart failure) ([place])   . Chronic systolic heart failure ([place])   . Complication of anesthesia    hypotension  . COPD (chronic obstructive pulmonary disease) ([place])   . Coronary artery disease   . Dialysis patient ([place])    Mon, Wed, Fri  . End stage renal disease ([place])   . GERD (gastroesophageal reflux disease)   . Headache   . History of kidney stones   . History of pulmonary embolism   . HLD (hyperlipidemia)   . HTN (hypertension)   . Hyperparathyroidism   . Myocardial infarction ([place])   . Peripheral vascular disease ([place])   . Shortness of breath dyspnea   . Sleep apnea    NO C-PAP, Patient stated in process of  "getting one"   . Tobacco dependence     Patient Active Problem List   Diagnosis Date  Noted  . Acute on chronic systolic CHF (congestive heart failure) ([place]) 01/18/2017  . Respiratory failure with hypoxia ([place]) 11/11/2016  . Acute respiratory failure ([place]) 10/28/2016  . Pulmonary embolism ([place]) 08/03/2016  . NSTEMI (non-ST elevated myocardial infarction) ([place]) 07/26/2016  . Acute respiratory failure with hypoxia ([place]) 05/24/2016  . Right lower lobe pneumonia ([place]) 05/24/2016  . Elevated troponin 05/24/2016  . Pulmonary edema 05/21/2016  . Kidney dialysis as the cause of abnormal reaction of the patient, or of later complication, without mention of misadventure at the time of the procedure (CODE) 03/05/2016  . PVD (peripheral vascular disease) ([place]) 03/05/2016  . Preop examination 05/02/2015  . Chronic systolic heart failure ([place])   . Sepsis ([place]) 01/13/2015  . Acute encephalopathy 01/13/2015  . Hyperkalemia 01/13/2015  . Gangrene of foot ([place]) 05/04/2014  . ESRD (end stage renal disease) on dialysis ([place]) 05/04/2014  . CAD (coronary artery disease) 05/04/2014  . Normocytic anemia 05/04/2014  . Hyperlipidemia 04/26/2010  . HYPERTENSION, BENIGN 04/26/2010  . CAD, NATIVE VESSEL 04/26/2010    Past Surgical History:  Procedure Laterality Date  . AMPUTATION Left 05/06/2014   Procedure: AMPUTATION BELOW KNEE;  Surgeon: Elam Dutch, MD;  Location: [place];  Service: Vascular;  Laterality: Left;  . AMPUTATION Right 01/12/2015   Procedure: Foot transmetatarsal amputation;  Surgeon: Algernon Huxley, MD;  Location: [facility];  Service: Vascular;  Laterality: Right;  . APPLICATION OF WOUND VAC Right 03/01/2015   Procedure: Application of Bio-connekt graft and wound vac application to right foot ;  Surgeon: Algernon Huxley, MD;  Location: [facility];  Service: Vascular;  Laterality: Right;  . AV FISTULA PLACEMENT Left   . AV FISTULA PLACEMENT Left 11/28/2016   Procedure: ARTERIOVENOUS (AV) FISTULA CREATION;  Surgeon: Algernon Huxley, MD;  Location: [facility];  Service: Vascular;  Laterality:  Left;  . CARDIAC CATHETERIZATION     stent placement   . CORONARY ANGIOPLASTY    . DIALYSIS/PERMA CATHETER INSERTION N/A 07/31/2016   Procedure: Dialysis/Perma Catheter Insertion;  Surgeon: Algernon Huxley, MD;  Location: [place] [facility];  Service: Cardiovascular;  Laterality: N/A;  . LIGATION OF ARTERIOVENOUS  FISTULA Right 01/31/2016   Procedure: LIGATION OF ARTERIOVENOUS  FISTULA;  Surgeon: Algernon Huxley, MD;  Location: [facility];  Service: Vascular;  Laterality: Right;  . PERIPHERAL VASCULAR CATHETERIZATION Right 12/15/2014   Procedure: Lower Extremity Angiography;  Surgeon: Algernon Huxley, MD;  Location: [place] [facility];  Service: Cardiovascular;  Laterality: Right;  . PERIPHERAL VASCULAR CATHETERIZATION  12/15/2014   Procedure: Lower Extremity Intervention;  Surgeon: Algernon Huxley, MD;  Location: [place] [facility];  Service: Cardiovascular;;  . PERIPHERAL VASCULAR CATHETERIZATION Right 08/14/2015   Procedure: A/V Shuntogram/Fistulagram;  Surgeon: Algernon Huxley, MD;  Location: [place] [facility];  Service: Cardiovascular;  Laterality: Right;  . PERIPHERAL VASCULAR CATHETERIZATION N/A 08/14/2015   Procedure: A/V Shunt Intervention;  Surgeon: Algernon Huxley, MD;  Location: [place] [facility];  Service: Cardiovascular;  Laterality: N/A;  . PERIPHERAL VASCULAR CATHETERIZATION N/A 01/11/2016   Procedure: Dialysis/Perma Catheter Insertion;  Surgeon: Algernon Huxley, MD;  Location: [place] [facility];  Service: Cardiovascular;  Laterality: N/A;  . REVISON OF ARTERIOVENOUS FISTULA Right 02/17/2016   Procedure: removal of AV fistula;  Surgeon: Serafina Mitchell, MD;  Location: [facility];  Service: Vascular;  Laterality: Right;  . REVISON OF ARTERIOVENOUS FISTULA Right 01/31/2016   Procedure: REVISON OF ARTERIOVENOUS FISTULA ( BRACHIOCEPHALIC ) W/ ARTEGRAFT;  Surgeon: Algernon Huxley, MD;  Location: [facility];  Service: Vascular;  Laterality: Right;  . TRANSMETATARSAL AMPUTATION Right 05/04/2015   Procedure:  TRANSMETATARSAL AMPUTATION REVISION, great toe amputation;  Surgeon: Algernon Huxley, MD;  Location: [facility];  Service: Vascular;  Laterality: Right;    Prior to Admission medications   Medication Sig Start Date End Date Taking? Authorizing Provider  apixaban (ELIQUIS) 5 MG TABS tablet Take 1 tablet (5 mg total) by mouth 2 (two) times daily. 01/20/17  Yes Gladstone Lighter, MD  atorvastatin (LIPITOR) 10 MG tablet Take 10 mg by mouth at bedtime.    Yes [provider]  budesonide-formoterol (SYMBICORT) 160-4.5 MCG/ACT inhaler Inhale 2 puffs into the lungs 2 (two) times daily. Reported on 08/14/2015   Yes [provider]  carvedilol (COREG) 3.125 MG tablet Take 1 tablet (3.125 mg total) by mouth 2 (two) times daily with a meal. 01/20/17  Yes Gladstone Lighter, MD  cefUROXime (CEFTIN) 250 MG tablet Take 1 tablet (250 mg total) by mouth every Monday, Wednesday, and Friday with hemodialysis. x7 days Patient taking differently: Take 250 mg by mouth Every Tuesday,Thursday,and Saturday with dialysis. x7 days 01/22/17  Yes Gladstone Lighter, MD  cetirizine (ZYRTEC) 10 MG tablet Take 10 mg by mouth daily.   Yes [provider]  guaifenesin (ROBITUSSIN) 100 MG/5ML syrup Take 200 mg by  mouth every 6 (six) hours as needed for cough.   Yes [provider]  Ipratropium-Albuterol (COMBIVENT RESPIMAT) 20-100 MCG/ACT AERS respimat Inhale 1 puff into the lungs every 6 (six) hours as needed for wheezing or shortness of breath.   Yes [provider]  menthol-cetylpyridinium (CEPACOL) 3 MG lozenge Take 1 lozenge (3 mg total) by mouth as needed for sore throat. 01/20/17  Yes Gladstone Lighter, MD  Multiple Vitamin (MULTIVITAMIN WITH MINERALS) TABS tablet Take 1 tablet by mouth 2 (two) times daily.   Yes [provider]  nitroGLYCERIN (NITROSTAT) 0.4 MG SL tablet Place 0.4 mg under the tongue every 5 (five) minutes as needed for chest pain.   Yes [provider]    nystatin-triamcinolone ointment (MYCOLOG) Apply 1 application topically 2 (two) times daily. 01/28/17  Yes Lavonia Drafts, MD  pantoprazole (PROTONIX) 20 MG tablet Take 20 mg by mouth daily.   Yes [provider]  pregabalin (LYRICA) 75 MG capsule Take 1 capsule (75 mg total) by mouth daily. 01/22/17  Yes Gladstone Lighter, MD  sevelamer carbonate (RENVELA) 800 MG tablet Take 800 mg by mouth 2 (two) times daily.    Yes [provider]  tiotropium (SPIRIVA) 18 MCG inhalation capsule Place 18 mcg into inhaler and inhale daily.   Yes [provider]  cefUROXime (CEFTIN) 250 MG tablet Take 1 tablet (250 mg total) by mouth daily. X 7 days Patient not taking: Reported on 02/10/2017 01/21/17   Gladstone Lighter, MD  midodrine (PROAMATINE) 10 MG tablet Take 5 mg by mouth 2 (two) times daily.    [provider]  nystatin ointment (MYCOSTATIN) Apply 1 application topically 2 (two) times daily. 02/14/17   Sable Feil, PA-C  traMADol (ULTRAM) 50 MG tablet Take 1 tablet (50 mg total) by mouth every 6 (six) hours as needed for moderate pain. 02/14/17   Sable Feil, PA-C    Allergies Dust mite extract  Family History  Problem Relation Age of Onset  . Heart failure Other   . Hypertension Other   . Leukemia Other   . Diabetes Other     Social History Social History  Substance Use Topics  . Smoking status: Light Tobacco Smoker    Packs/day: 0.25    Types: Cigarettes  . Smokeless tobacco: Never Used     Comment: 5  . Alcohol use No    Review of Systems  Constitutional: No fever/chills Eyes: No visual changes. ENT: No sore throat. Cardiovascular: Denies chest pain. Respiratory: Denies shortness of breath. Gastrointestinal: No abdominal pain.  No nausea, no vomiting.  No diarrhea.  No constipation. Genitourinary: see history of present illness Musculoskeletal: Negative for back pain. Skin: Negative for rash. Neurological: Negative for headaches, focal  weakness   ____________________________________________   PHYSICAL EXAM:  VITAL SIGNS: ED Triage Vitals [02/10/17 1157]  Enc Vitals Group     BP 104/70     Pulse Rate 75     Resp 20     Temp 97.7 F (36.5 C)     Temp Source Oral     SpO2 100 %     Weight 182 lb (82.6 kg)     Height 6\' 2"  (1.88 m)     Head Circumference      Peak Flow      Pain Score 10     Pain Loc      Pain Edu?      Excl. in [place]?     Constitutional: Alert and  oriented. Well appearing and in no acute distress. Eyes: Conjunctivae are normal.  Head: Atraumatic. Nose: No congestion/rhinnorhea. Mouth/Throat: Mucous membranes are moist.  Oropharynx non-erythematous. Neck: No stridor.  Cardiovascular: Normal rate, regular rhythm. Grossly normal heart sounds.  Good peripheral circulation. Respiratory: Normal respiratory effort.  No retractions. Lungs CTAB. Gastrointestinal: Soft and nontender. No distention. No abdominal bruits. No CVA tenderness. Genitourinary: uncircumcised with discharge and some swelling see history of present illness for further description Musculoskeletal: No lower extremity tenderness nor edema.  No joint effusions. Neurologic:  Normal speech and language. No gross focal neurologic deficits are appreciated. No gait instability. Skin:  Skin is warm, dry and intact. No rash noted. Psychiatric: Mood and affect are normal. Speech and behavior are normal.  ____________________________________________   LABS (all labs ordered are listed, but only abnormal results are displayed)  Labs Reviewed  COMPREHENSIVE METABOLIC PANEL - Abnormal; Notable for the following:       Result Value   BUN 56 (*)    Creatinine, Ser 7.35 (*)    Albumin 2.4 (*)    AST 45 (*)    GFR calc non Af Amer 7 (*)    GFR calc Af Amer 8 (*)    All other components within normal limits  CBC WITH DIFFERENTIAL/PLATELET - Abnormal; Notable for the following:    WBC 10.8 (*)    RBC 3.55 (*)    Hemoglobin 9.4 (*)     HCT 29.6 (*)    MCHC 31.8 (*)    RDW 17.7 (*)    Neutro Abs 7.9 (*)    Monocytes Absolute 1.2 (*)    All other components within normal limits  TROPONIN I - Abnormal; Notable for the following:    Troponin I 0.15 (*)    All other components within normal limits  BRAIN NATRIURETIC PEPTIDE - Abnormal; Notable for the following:    B Natriuretic Peptide 4,123.0 (*)    All other components within normal limits  TROPONIN I - Abnormal; Notable for the following:    Troponin I 0.14 (*)    All other components within normal limits  GRAM STAIN   ____________________________________________  EKG  KG is not currently available to describe ____________________________________________  RADIOLOGY  chest x-ray felt by radiology to represent some mild CHF ____________________________________________   PROCEDURES  Procedure(s) performed:   Procedures  Critical Care performed:  ____________________________________________   INITIAL IMPRESSION / ASSESSMENT AND PLAN / ED COURSE  Pertinent labs & imaging results that were available during my care of the patient were reviewed by me and considered in my medical decision making (see chart for details).  patient received and Rocephin for his gram-negative diplococci will follow up with urology to check on his discharge in the possibility of exophytic lesion and the swelling in the shaft of his penis      ____________________________________________   FINAL CLINICAL IMPRESSION(S) / ED DIAGNOSES  Final diagnoses:  Balanitis   possible STD   NEW MEDICATIONS STARTED DURING THIS VISIT:  Discharge Medication List as of 02/10/2017  5:28 PM       Note:  This document was prepared using Dragon voice recognition software and may include unintentional dictation errors.    Nena Polio, MD 02/21/17 2137

## 2017-02-21 NOTE — ED Provider Notes (Signed)
[facility] Emergency Department Provider Note   ____________________________________________   First MD Initiated Contact with Patient 02/10/17 1218     (approximate)  I have reviewed the triage vital signs and the nursing notes.   HISTORY  Chief Complaint Groin Pain   HPI [name] is a 61 y.o. male this is at least the second attempt to dictated note on this patient the note was previously dictated as at least one other time in the computer lost it completely. Patient was seen 4 days after initial visit and diagnosis of balanitis which was treated with Doxy and a cream. Patient complains of increasing pain and discharge in his penis. Patient's penis is somewhat swollen there is a discharge she is circumcised but has no phimosis or paraphimosis. There appears to be the possibility of a exophytic lesion at the very tip of his penis. Gram stain of the discharge showed gram-negative diplococci. Patient was given Rocephin for this. Patient was given referral to urology to follow-up.   Past Medical History:  Diagnosis Date  . 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 Amputee, below knee, left ([place])   . Anemia   . Asthma   . Cardiomyopathy ([place])   . CHF (congestive heart failure) ([place])   . Chronic systolic heart failure ([place])   . Complication of anesthesia    hypotension  . COPD (chronic obstructive pulmonary disease) ([place])   . Coronary artery disease   . Dialysis patient ([place])    Mon, Wed, Fri  . End stage renal disease ([place])   . GERD (gastroesophageal reflux disease)   . Headache   . History of kidney stones   . History of pulmonary embolism   . HLD (hyperlipidemia)   . HTN (hypertension)   . Hyperparathyroidism   . Myocardial infarction ([place])   . Peripheral vascular disease ([place])   . Shortness of breath dyspnea   . Sleep apnea    NO C-PAP, Patient stated in process of  "getting one"   . Tobacco dependence     Patient Active  Problem List   Diagnosis Date Noted  . Acute on chronic systolic CHF (congestive heart failure) ([place]) 01/18/2017  . Respiratory failure with hypoxia ([place]) 11/11/2016  . Acute respiratory failure ([place]) 10/28/2016  . Pulmonary embolism ([place]) 08/03/2016  . NSTEMI (non-ST elevated myocardial infarction) ([place]) 07/26/2016  . Acute respiratory failure with hypoxia ([place]) 05/24/2016  . Right lower lobe pneumonia ([place]) 05/24/2016  . Elevated troponin 05/24/2016  . Pulmonary edema 05/21/2016  . Kidney dialysis as the cause of abnormal reaction of the patient, or of later complication, without mention of misadventure at the time of the procedure (CODE) 03/05/2016  . PVD (peripheral vascular disease) ([place]) 03/05/2016  . Preop examination 05/02/2015  . Chronic systolic heart failure ([place])   . Sepsis ([place]) 01/13/2015  . Acute encephalopathy 01/13/2015  . Hyperkalemia 01/13/2015  . Gangrene of foot ([place]) 05/04/2014  . ESRD (end stage renal disease) on dialysis ([place]) 05/04/2014  . CAD (coronary artery disease) 05/04/2014  . Normocytic anemia 05/04/2014  . Hyperlipidemia 04/26/2010  . HYPERTENSION, BENIGN 04/26/2010  . CAD, NATIVE VESSEL 04/26/2010    Past Surgical History:  Procedure Laterality Date  . AMPUTATION Left 05/06/2014   Procedure: AMPUTATION BELOW KNEE;  Surgeon: Elam Dutch, MD;  Location: [place];  Service: Vascular;  Laterality: Left;  . AMPUTATION Right 01/12/2015   Procedure: Foot transmetatarsal amputation;  Surgeon: Algernon Huxley,  MD;  Location: [facility];  Service: Vascular;  Laterality: Right;  . APPLICATION OF WOUND VAC Right 03/01/2015   Procedure: Application of Bio-connekt graft and wound vac application to right foot ;  Surgeon: Algernon Huxley, MD;  Location: [facility];  Service: Vascular;  Laterality: Right;  . AV FISTULA PLACEMENT Left   . AV FISTULA PLACEMENT Left 11/28/2016   Procedure: ARTERIOVENOUS (AV) FISTULA CREATION;  Surgeon: Algernon Huxley, MD;  Location: [facility];   Service: Vascular;  Laterality: Left;  . CARDIAC CATHETERIZATION     stent placement   . CORONARY ANGIOPLASTY    . DIALYSIS/PERMA CATHETER INSERTION N/A 07/31/2016   Procedure: Dialysis/Perma Catheter Insertion;  Surgeon: Algernon Huxley, MD;  Location: [place] [facility];  Service: Cardiovascular;  Laterality: N/A;  . LIGATION OF ARTERIOVENOUS  FISTULA Right 01/31/2016   Procedure: LIGATION OF ARTERIOVENOUS  FISTULA;  Surgeon: Algernon Huxley, MD;  Location: [facility];  Service: Vascular;  Laterality: Right;  . PERIPHERAL VASCULAR CATHETERIZATION Right 12/15/2014   Procedure: Lower Extremity Angiography;  Surgeon: Algernon Huxley, MD;  Location: [place] [facility];  Service: Cardiovascular;  Laterality: Right;  . PERIPHERAL VASCULAR CATHETERIZATION  12/15/2014   Procedure: Lower Extremity Intervention;  Surgeon: Algernon Huxley, MD;  Location: [place] [facility];  Service: Cardiovascular;;  . PERIPHERAL VASCULAR CATHETERIZATION Right 08/14/2015   Procedure: A/V Shuntogram/Fistulagram;  Surgeon: Algernon Huxley, MD;  Location: [place] [facility];  Service: Cardiovascular;  Laterality: Right;  . PERIPHERAL VASCULAR CATHETERIZATION N/A 08/14/2015   Procedure: A/V Shunt Intervention;  Surgeon: Algernon Huxley, MD;  Location: [place] [facility];  Service: Cardiovascular;  Laterality: N/A;  . PERIPHERAL VASCULAR CATHETERIZATION N/A 01/11/2016   Procedure: Dialysis/Perma Catheter Insertion;  Surgeon: Algernon Huxley, MD;  Location: [place] [facility];  Service: Cardiovascular;  Laterality: N/A;  . REVISON OF ARTERIOVENOUS FISTULA Right 02/17/2016   Procedure: removal of AV fistula;  Surgeon: Serafina Mitchell, MD;  Location: [facility];  Service: Vascular;  Laterality: Right;  . REVISON OF ARTERIOVENOUS FISTULA Right 01/31/2016   Procedure: REVISON OF ARTERIOVENOUS FISTULA ( BRACHIOCEPHALIC ) W/ ARTEGRAFT;  Surgeon: Algernon Huxley, MD;  Location: [facility];  Service: Vascular;  Laterality: Right;  . TRANSMETATARSAL AMPUTATION Right  05/04/2015   Procedure: TRANSMETATARSAL AMPUTATION REVISION, great toe amputation;  Surgeon: Algernon Huxley, MD;  Location: [facility];  Service: Vascular;  Laterality: Right;    Prior to Admission medications   Medication Sig Start Date End Date Taking? Authorizing Provider  apixaban (ELIQUIS) 5 MG TABS tablet Take 1 tablet (5 mg total) by mouth 2 (two) times daily. 01/20/17  Yes Gladstone Lighter, MD  atorvastatin (LIPITOR) 10 MG tablet Take 10 mg by mouth at bedtime.    Yes [provider]  budesonide-formoterol (SYMBICORT) 160-4.5 MCG/ACT inhaler Inhale 2 puffs into the lungs 2 (two) times daily. Reported on 08/14/2015   Yes [provider]  carvedilol (COREG) 3.125 MG tablet Take 1 tablet (3.125 mg total) by mouth 2 (two) times daily with a meal. 01/20/17  Yes Gladstone Lighter, MD  cefUROXime (CEFTIN) 250 MG tablet Take 1 tablet (250 mg total) by mouth every Monday, Wednesday, and Friday with hemodialysis. x7 days Patient taking differently: Take 250 mg by mouth Every Tuesday,Thursday,and Saturday with dialysis. x7 days 01/22/17  Yes Gladstone Lighter, MD  cetirizine (ZYRTEC) 10 MG tablet Take 10 mg by mouth daily.   Yes [provider]  guaifenesin (ROBITUSSIN) 100  MG/5ML syrup Take 200 mg by mouth every 6 (six) hours as needed for cough.   Yes [provider]  Ipratropium-Albuterol (COMBIVENT RESPIMAT) 20-100 MCG/ACT AERS respimat Inhale 1 puff into the lungs every 6 (six) hours as needed for wheezing or shortness of breath.   Yes [provider]  menthol-cetylpyridinium (CEPACOL) 3 MG lozenge Take 1 lozenge (3 mg total) by mouth as needed for sore throat. 01/20/17  Yes Gladstone Lighter, MD  Multiple Vitamin (MULTIVITAMIN WITH MINERALS) TABS tablet Take 1 tablet by mouth 2 (two) times daily.   Yes [provider]  nitroGLYCERIN (NITROSTAT) 0.4 MG SL tablet Place 0.4 mg under the tongue every 5 (five) minutes as needed for chest pain.   Yes  [provider]  nystatin-triamcinolone ointment (MYCOLOG) Apply 1 application topically 2 (two) times daily. 01/28/17  Yes Lavonia Drafts, MD  pantoprazole (PROTONIX) 20 MG tablet Take 20 mg by mouth daily.   Yes [provider]  pregabalin (LYRICA) 75 MG capsule Take 1 capsule (75 mg total) by mouth daily. 01/22/17  Yes Gladstone Lighter, MD  sevelamer carbonate (RENVELA) 800 MG tablet Take 800 mg by mouth 2 (two) times daily.    Yes [provider]  tiotropium (SPIRIVA) 18 MCG inhalation capsule Place 18 mcg into inhaler and inhale daily.   Yes [provider]  cefUROXime (CEFTIN) 250 MG tablet Take 1 tablet (250 mg total) by mouth daily. X 7 days Patient not taking: Reported on 02/10/2017 01/21/17   Gladstone Lighter, MD  midodrine (PROAMATINE) 10 MG tablet Take 5 mg by mouth 2 (two) times daily.    [provider]  nystatin ointment (MYCOSTATIN) Apply 1 application topically 2 (two) times daily. 02/14/17   Sable Feil, PA-C  traMADol (ULTRAM) 50 MG tablet Take 1 tablet (50 mg total) by mouth every 6 (six) hours as needed for moderate pain. 02/14/17   Sable Feil, PA-C    Allergies Dust mite extract  Family History  Problem Relation Age of Onset  . Heart failure Other   . Hypertension Other   . Leukemia Other   . Diabetes Other     Social History Social History  Substance Use Topics  . Smoking status: Light Tobacco Smoker    Packs/day: 0.25    Types: Cigarettes  . Smokeless tobacco: Never Used     Comment: 5  . Alcohol use No    Review of Systems  Constitutional: No fever/chills Eyes: No visual changes. ENT: No sore throat. Cardiovascular: Denies chest pain. Respiratory: Denies shortness of breath. Gastrointestinal: No abdominal pain.  No nausea, no vomiting.  No diarrhea.  No constipation. Genitourinary:see history of present illness Musculoskeletal: Negative for back pain. Skin: Negative for rash. Neurological:  Negative for headaches, focal weakness   ____________________________________________   PHYSICAL EXAM:  VITAL SIGNS: ED Triage Vitals [02/10/17 1157]  Enc Vitals Group     BP 104/70     Pulse Rate 75     Resp 20     Temp 97.7 F (36.5 C)     Temp Source Oral     SpO2 100 %     Weight 182 lb (82.6 kg)     Height 6\' 2"  (1.88 m)     Head Circumference      Peak Flow      Pain Score 10     Pain Loc      Pain Edu?      Excl. in [place]?  Constitutional: Alert and oriented. Well appearing and in no acute distress. Eyes: Conjunctivae are normal.  Head: Atraumatic. Nose: No congestion/rhinnorhea. Mouth/Throat: Mucous membranes are moist.  Oropharynx non-erythematous. Neck: No stridor.   Cardiovascular: Normal rate, regular rhythm. Grossly normal heart sounds.  Good peripheral circulation. Respiratory: Normal respiratory effort.  No retractions. Lungs CTAB. Gastrointestinal: Soft and nontender. No distention. No abdominal bruits. No CVA tenderness. Genitourinary: uncircumcised male some tenderness in the tip of his penis and in the head of the penis. No pain in his testicles. See further diet description and history of present illness Neurologic:  Normal speech and language. No gross focal neurologic deficits are appreciated. No gait instability. Skin:  Skin is warm, dry and intact. No rash noted. Psychiatric: Mood and affect are normal. Speech and behavior are normal.  ____________________________________________   LABS (all labs ordered are listed, but only abnormal results are displayed)  Labs Reviewed  COMPREHENSIVE METABOLIC PANEL - Abnormal; Notable for the following:       Result Value   BUN 56 (*)    Creatinine, Ser 7.35 (*)    Albumin 2.4 (*)    AST 45 (*)    GFR calc non Af Amer 7 (*)    GFR calc Af Amer 8 (*)    All other components within normal limits  CBC WITH DIFFERENTIAL/PLATELET - Abnormal; Notable for the following:    WBC 10.8 (*)    RBC 3.55 (*)     Hemoglobin 9.4 (*)    HCT 29.6 (*)    MCHC 31.8 (*)    RDW 17.7 (*)    Neutro Abs 7.9 (*)    Monocytes Absolute 1.2 (*)    All other components within normal limits  TROPONIN I - Abnormal; Notable for the following:    Troponin I 0.15 (*)    All other components within normal limits  BRAIN NATRIURETIC PEPTIDE - Abnormal; Notable for the following:    B Natriuretic Peptide 4,123.0 (*)    All other components within normal limits  TROPONIN I - Abnormal; Notable for the following:    Troponin I 0.14 (*)    All other components within normal limits  GRAM STAIN   ____________________________________________  EKG  EKG is unavailable at present ____________________________________________  RADIOLOGY  chest x-ray felt by radiology show mild congestive failure patient is not short of breath at present ____________________________________________   PROCEDURES  Procedure(s) performed:   Procedures  Critical Care performed:   ____________________________________________   INITIAL IMPRESSION / ASSESSMENT AND PLAN / ED COURSE  Pertinent labs & imaging results that were available during my care of the patient were reviewed by me and considered in my medical decision making (see chart for details).        ____________________________________________   FINAL CLINICAL IMPRESSION(S) / ED DIAGNOSES  Final diagnoses:  Balanitis   possible STD   NEW MEDICATIONS STARTED DURING THIS VISIT:  Discharge Medication List as of 02/10/2017  5:28 PM       Note:  This document was prepared using Dragon voice recognition software and may include unintentional dictation errors.    Nena Polio, MD 02/21/17 2141

## 2017-03-04 ENCOUNTER — Ambulatory Visit (INDEPENDENT_AMBULATORY_CARE_PROVIDER_SITE_OTHER): Payer: Medicare Other | Admitting: Vascular Surgery

## 2017-03-04 ENCOUNTER — Encounter (INDEPENDENT_AMBULATORY_CARE_PROVIDER_SITE_OTHER): Payer: Medicare Other

## 2017-03-05 ENCOUNTER — Emergency Department
Admission: EM | Admit: 2017-03-05 | Discharge: 2017-03-05 | Disposition: A | Discharge status: Home / Self Care | Payer: Medicare Other | Attending: Emergency Medicine | Admitting: Emergency Medicine

## 2017-03-05 ENCOUNTER — Emergency Department: Payer: Medicare Other

## 2017-03-05 ENCOUNTER — Encounter: Payer: Self-pay | Admitting: Intensive Care

## 2017-03-05 DIAGNOSIS — I953 Hypotension of hemodialysis: Secondary | ICD-10-CM | POA: Insufficient documentation

## 2017-03-05 DIAGNOSIS — Z992 Dependence on renal dialysis: Secondary | ICD-10-CM | POA: Insufficient documentation

## 2017-03-05 DIAGNOSIS — I429 Cardiomyopathy, unspecified: Secondary | ICD-10-CM | POA: Diagnosis not present

## 2017-03-05 DIAGNOSIS — I252 Old myocardial infarction: Secondary | ICD-10-CM | POA: Diagnosis not present

## 2017-03-05 DIAGNOSIS — Z79899 Other long term (current) drug therapy: Secondary | ICD-10-CM | POA: Diagnosis not present

## 2017-03-05 DIAGNOSIS — N186 End stage renal disease: Secondary | ICD-10-CM | POA: Diagnosis not present

## 2017-03-05 DIAGNOSIS — J449 Chronic obstructive pulmonary disease, unspecified: Secondary | ICD-10-CM | POA: Diagnosis not present

## 2017-03-05 DIAGNOSIS — I5022 Chronic systolic (congestive) heart failure: Secondary | ICD-10-CM | POA: Diagnosis not present

## 2017-03-05 DIAGNOSIS — E785 Hyperlipidemia, unspecified: Secondary | ICD-10-CM | POA: Insufficient documentation

## 2017-03-05 DIAGNOSIS — F17210 Nicotine dependence, cigarettes, uncomplicated: Secondary | ICD-10-CM | POA: Insufficient documentation

## 2017-03-05 DIAGNOSIS — I132 Hypertensive heart and chronic kidney disease with heart failure and with stage 5 chronic kidney disease, or end stage renal disease: Secondary | ICD-10-CM | POA: Diagnosis not present

## 2017-03-05 DIAGNOSIS — R531 Weakness: Secondary | ICD-10-CM | POA: Diagnosis present

## 2017-03-05 DIAGNOSIS — Z86718 Personal history of other venous thrombosis and embolism: Secondary | ICD-10-CM | POA: Diagnosis not present

## 2017-03-05 DIAGNOSIS — L97911 Non-pressure chronic ulcer of unspecified part of right lower leg limited to breakdown of skin: Secondary | ICD-10-CM | POA: Insufficient documentation

## 2017-03-05 LAB — CBC
HCT: 34.9 % — ABNORMAL LOW (ref 40.0–52.0)
Hemoglobin: 11.2 g/dL — ABNORMAL LOW (ref 13.0–18.0)
MCH: 26.9 pg (ref 26.0–34.0)
MCHC: 32.1 g/dL (ref 32.0–36.0)
MCV: 83.8 fL (ref 80.0–100.0)
PLATELETS: 186 10*3/uL (ref 150–440)
RBC: 4.16 MIL/uL — ABNORMAL LOW (ref 4.40–5.90)
RDW: 20.2 % — AB (ref 11.5–14.5)
WBC: 8.3 10*3/uL (ref 3.8–10.6)

## 2017-03-05 LAB — BASIC METABOLIC PANEL
Anion gap: 10 (ref 5–15)
BUN: 52 mg/dL — AB (ref 6–20)
CO2: 24 mmol/L (ref 22–32)
CREATININE: 6.25 mg/dL — AB (ref 0.61–1.24)
Calcium: 9.3 mg/dL (ref 8.9–10.3)
Chloride: 104 mmol/L (ref 101–111)
GFR calc Af Amer: 10 mL/min — ABNORMAL LOW (ref >60)
GFR, EST NON AFRICAN AMERICAN: 9 mL/min — AB (ref >60)
Glucose, Bld: 127 mg/dL — ABNORMAL HIGH (ref 65–99)
Potassium: 4.8 mmol/L (ref 3.5–5.1)
SODIUM: 138 mmol/L (ref 135–145)

## 2017-03-05 LAB — PROTIME-INR
INR: 1.25
PROTHROMBIN TIME: 15.6 s — AB (ref 11.4–15.2)

## 2017-03-05 LAB — TROPONIN I: Troponin I: 0.09 ng/mL (ref <0.03)

## 2017-03-05 MED ORDER — SODIUM CHLORIDE 0.9 % IV BOLUS (SEPSIS)
250.0000 mL | Freq: Once | INTRAVENOUS | Status: AC
  Administered 2017-03-05: 250 mL via INTRAVENOUS

## 2017-03-05 NOTE — ED Notes (Signed)
Pt discharged home after verbalizing understanding of discharge instructions; nad noted. 

## 2017-03-05 NOTE — ED Notes (Signed)
Virgilio Belling, RN stuck patient x2 with no success.

## 2017-03-05 NOTE — ED Triage Notes (Signed)
Patient arrived by EMS from home for c/o weakness after dialysis treatment this AM. Reports finishing treatment. Dialysis site in L arm. A&O x4. Below L knee amputee. C/O sores in groin area from diabetic ulcers

## 2017-03-05 NOTE — ED Notes (Signed)
[name], EDT attempting IV and blood draw at this time.

## 2017-03-05 NOTE — ED Provider Notes (Signed)
[facility] Emergency Department Provider Note  ____________________________________________  Time seen: Approximately 12:12 PM  I have reviewed the triage vital signs and the nursing notes.   HISTORY  Chief Complaint Weakness   HPI [name] is a 61 y.o. male with a history of ESRD on HD (MWF), CHF, anemia, PE on Coumadin, hypertension, hyperlipidemia, peripheral vascular disease who presents for evaluation of generalized weakness. Patient reports that he's been having these symptoms every time after dialysis for several weeks. His nephrologist has been adjusting his time of dialysis since patient's BP dropped significantly after he receives a treatment. He has not missed any dialysis treatment recently. Went to dialysis this morning he had a full treatment. Upon arrival to his home patient started feeling generalized weakness and dizzy like he was going to pass out. He denies chest pain, palpitations, shortness of breath, nausea, vomiting, fever, chills, diarrhea, melena, he does not make any urine. No cough or congestion. Patient is also complaining of a ulcer located in his right groin area for several days now. Has not seen a doctor for that.  Past Medical History:  Diagnosis Date  . 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 Amputee, below knee, left ([place])   . Anemia   . Asthma   . Cardiomyopathy ([place])   . CHF (congestive heart failure) ([place])   . Chronic systolic heart failure ([place])   . Complication of anesthesia    hypotension  . COPD (chronic obstructive pulmonary disease) ([place])   . Coronary artery disease   . Dialysis patient ([place])    Mon, Wed, Fri  . End stage renal disease ([place])   . GERD (gastroesophageal reflux disease)   . Headache   . History of kidney stones   . History of pulmonary embolism   . HLD (hyperlipidemia)   . HTN (hypertension)   . Hyperparathyroidism   . Myocardial infarction ([place])   . Peripheral vascular  disease ([place])   . Shortness of breath dyspnea   . Sleep apnea    NO C-PAP, Patient stated in process of  "getting one"   . Tobacco dependence     Patient Active Problem List   Diagnosis Date Noted  . Acute on chronic systolic CHF (congestive heart failure) ([place]) 01/18/2017  . Respiratory failure with hypoxia ([place]) 11/11/2016  . Acute respiratory failure ([place]) 10/28/2016  . Pulmonary embolism ([place]) 08/03/2016  . NSTEMI (non-ST elevated myocardial infarction) ([place]) 07/26/2016  . Acute respiratory failure with hypoxia ([place]) 05/24/2016  . Right lower lobe pneumonia ([place]) 05/24/2016  . Elevated troponin 05/24/2016  . Pulmonary edema 05/21/2016  . Kidney dialysis as the cause of abnormal reaction of the patient, or of later complication, without mention of misadventure at the time of the procedure (CODE) 03/05/2016  . PVD (peripheral vascular disease) ([place]) 03/05/2016  . Preop examination 05/02/2015  . Chronic systolic heart failure ([place])   . Sepsis ([place]) 01/13/2015  . Acute encephalopathy 01/13/2015  . Hyperkalemia 01/13/2015  . Gangrene of foot ([place]) 05/04/2014  . ESRD (end stage renal disease) on dialysis ([place]) 05/04/2014  . CAD (coronary artery disease) 05/04/2014  . Normocytic anemia 05/04/2014  . Hyperlipidemia 04/26/2010  . HYPERTENSION, BENIGN 04/26/2010  . CAD, NATIVE VESSEL 04/26/2010    Past Surgical History:  Procedure Laterality Date  . AMPUTATION Left 05/06/2014   Procedure: AMPUTATION BELOW KNEE;  Surgeon: Elam Dutch, MD;  Location: [place];  Service: Vascular;  Laterality: Left;  .  AMPUTATION Right 01/12/2015   Procedure: Foot transmetatarsal amputation;  Surgeon: Algernon Huxley, MD;  Location: [facility];  Service: Vascular;  Laterality: Right;  . APPLICATION OF WOUND VAC Right 03/01/2015   Procedure: Application of Bio-connekt graft and wound vac application to right foot ;  Surgeon: Algernon Huxley, MD;  Location: [facility];  Service: Vascular;  Laterality: Right;  . AV  FISTULA PLACEMENT Left   . AV FISTULA PLACEMENT Left 11/28/2016   Procedure: ARTERIOVENOUS (AV) FISTULA CREATION;  Surgeon: Algernon Huxley, MD;  Location: [facility];  Service: Vascular;  Laterality: Left;  . CARDIAC CATHETERIZATION     stent placement   . CORONARY ANGIOPLASTY    . DIALYSIS/PERMA CATHETER INSERTION N/A 07/31/2016   Procedure: Dialysis/Perma Catheter Insertion;  Surgeon: Algernon Huxley, MD;  Location: [place] [facility];  Service: Cardiovascular;  Laterality: N/A;  . LIGATION OF ARTERIOVENOUS  FISTULA Right 01/31/2016   Procedure: LIGATION OF ARTERIOVENOUS  FISTULA;  Surgeon: Algernon Huxley, MD;  Location: [facility];  Service: Vascular;  Laterality: Right;  . PERIPHERAL VASCULAR CATHETERIZATION Right 12/15/2014   Procedure: Lower Extremity Angiography;  Surgeon: Algernon Huxley, MD;  Location: [place] [facility];  Service: Cardiovascular;  Laterality: Right;  . PERIPHERAL VASCULAR CATHETERIZATION  12/15/2014   Procedure: Lower Extremity Intervention;  Surgeon: Algernon Huxley, MD;  Location: [place] [facility];  Service: Cardiovascular;;  . PERIPHERAL VASCULAR CATHETERIZATION Right 08/14/2015   Procedure: A/V Shuntogram/Fistulagram;  Surgeon: Algernon Huxley, MD;  Location: [place] [facility];  Service: Cardiovascular;  Laterality: Right;  . PERIPHERAL VASCULAR CATHETERIZATION N/A 08/14/2015   Procedure: A/V Shunt Intervention;  Surgeon: Algernon Huxley, MD;  Location: [place] [facility];  Service: Cardiovascular;  Laterality: N/A;  . PERIPHERAL VASCULAR CATHETERIZATION N/A 01/11/2016   Procedure: Dialysis/Perma Catheter Insertion;  Surgeon: Algernon Huxley, MD;  Location: [place] [facility];  Service: Cardiovascular;  Laterality: N/A;  . REVISON OF ARTERIOVENOUS FISTULA Right 02/17/2016   Procedure: removal of AV fistula;  Surgeon: Serafina Mitchell, MD;  Location: [facility];  Service: Vascular;  Laterality: Right;  . REVISON OF ARTERIOVENOUS FISTULA Right 01/31/2016   Procedure: REVISON OF ARTERIOVENOUS FISTULA  ( BRACHIOCEPHALIC ) W/ ARTEGRAFT;  Surgeon: Algernon Huxley, MD;  Location: [facility];  Service: Vascular;  Laterality: Right;  . TRANSMETATARSAL AMPUTATION Right 05/04/2015   Procedure: TRANSMETATARSAL AMPUTATION REVISION, great toe amputation;  Surgeon: Algernon Huxley, MD;  Location: [facility];  Service: Vascular;  Laterality: Right;    Prior to Admission medications   Medication Sig Start Date End Date Taking? Authorizing Provider  apixaban (ELIQUIS) 5 MG TABS tablet Take 1 tablet (5 mg total) by mouth 2 (two) times daily. 01/20/17   Gladstone Lighter, MD  atorvastatin (LIPITOR) 10 MG tablet Take 10 mg by mouth at bedtime.     [provider]  budesonide-formoterol (SYMBICORT) 160-4.5 MCG/ACT inhaler Inhale 2 puffs into the lungs 2 (two) times daily. Reported on 08/14/2015    [provider]  carvedilol (COREG) 3.125 MG tablet Take 1 tablet (3.125 mg total) by mouth 2 (two) times daily with a meal. 01/20/17   Gladstone Lighter, MD  cefUROXime (CEFTIN) 250 MG tablet Take 1 tablet (250 mg total) by mouth daily. X 7 days Patient not taking: Reported on 02/10/2017 01/21/17   Gladstone Lighter, MD  cefUROXime (CEFTIN) 250 MG tablet Take 1 tablet (250 mg total) by mouth every Monday, Wednesday, and Friday with hemodialysis. x7  days Patient taking differently: Take 250 mg by mouth Every Tuesday,Thursday,and Saturday with dialysis. x7 days 01/22/17   Gladstone Lighter, MD  cetirizine (ZYRTEC) 10 MG tablet Take 10 mg by mouth daily.    [provider]  guaifenesin (ROBITUSSIN) 100 MG/5ML syrup Take 200 mg by mouth every 6 (six) hours as needed for cough.    [provider]  Ipratropium-Albuterol (COMBIVENT RESPIMAT) 20-100 MCG/ACT AERS respimat Inhale 1 puff into the lungs every 6 (six) hours as needed for wheezing or shortness of breath.    [provider]  menthol-cetylpyridinium (CEPACOL) 3 MG lozenge Take 1 lozenge (3 mg total) by mouth as needed for sore throat.  01/20/17   Gladstone Lighter, MD  midodrine (PROAMATINE) 10 MG tablet Take 5 mg by mouth 2 (two) times daily.    [provider]  Multiple Vitamin (MULTIVITAMIN WITH MINERALS) TABS tablet Take 1 tablet by mouth 2 (two) times daily.    [provider]  nitroGLYCERIN (NITROSTAT) 0.4 MG SL tablet Place 0.4 mg under the tongue every 5 (five) minutes as needed for chest pain.    [provider]  nystatin ointment (MYCOSTATIN) Apply 1 application topically 2 (two) times daily. 02/14/17   Sable Feil, PA-C  nystatin-triamcinolone ointment [facility]) Apply 1 application topically 2 (two) times daily. 01/28/17   Lavonia Drafts, MD  pantoprazole (PROTONIX) 20 MG tablet Take 20 mg by mouth daily.    [provider]  pregabalin (LYRICA) 75 MG capsule Take 1 capsule (75 mg total) by mouth daily. 01/22/17   Gladstone Lighter, MD  sevelamer carbonate (RENVELA) 800 MG tablet Take 800 mg by mouth 2 (two) times daily.     [provider]  tiotropium (SPIRIVA) 18 MCG inhalation capsule Place 18 mcg into inhaler and inhale daily.    [provider]  traMADol (ULTRAM) 50 MG tablet Take 1 tablet (50 mg total) by mouth every 6 (six) hours as needed for moderate pain. 02/14/17   Sable Feil, PA-C    Allergies Dust mite extract  Family History  Problem Relation Age of Onset  . Heart failure Other   . Hypertension Other   . Leukemia Other   . Diabetes Other     Social History Social History  Substance Use Topics  . Smoking status: Light Tobacco Smoker    Packs/day: 0.25    Types: Cigarettes  . Smokeless tobacco: Never Used     Comment: 5  . Alcohol use No    Review of Systems  Constitutional: Negative for fever. + Generalized weakness and lightheadedness Eyes: Negative for visual changes. ENT: Negative for sore throat. Neck: No neck pain  Cardiovascular: Negative for chest pain. Respiratory: Negative for shortness of breath. Gastrointestinal:  Negative for abdominal pain, vomiting or diarrhea. Genitourinary: Negative for dysuria. Musculoskeletal: Negative for back pain. Skin: Negative for rash. + Groin ulcer Neurological: Negative for headaches, weakness or numbness. Psych: No SI or HI  ____________________________________________   PHYSICAL EXAM:  VITAL SIGNS: ED Triage Vitals  Enc Vitals Group     BP 03/05/17 1115 (!) 127/47     Pulse Rate 03/05/17 1115 74     Resp 03/05/17 1115 16     Temp 03/05/17 1115 97.9 F (36.6 C)     Temp Source 03/05/17 1115 Oral     SpO2 03/05/17 1115 95 %     Weight 03/05/17 1104 182 lb (82.6 kg)     Height 03/05/17 1104 6\' 2"  (1.88 m)  Head Circumference --      Peak Flow --      Pain Score 03/05/17 1103 7     Pain Loc --      Pain Edu? --      Excl. in [place]? --     Constitutional: Alert and oriented. Well appearing and in no apparent distress. HEENT:      Head: Normocephalic and atraumatic.         Eyes: Conjunctivae are normal. Sclera is non-icteric.       Mouth/Throat: Mucous membranes are moist.       Neck: Supple with no signs of meningismus. Cardiovascular: Regular rate and rhythm. No murmurs, gallops, or rubs. 2+ symmetrical distal pulses are present in all extremities. No JVD. Respiratory: Normal respiratory effort. Lungs are clear to auscultation bilaterally. No wheezes, crackles, or rhonchi.  Gastrointestinal: Soft, non tender, and non distended with positive bowel sounds. No rebound or guarding. Musculoskeletal: Nontender with normal range of motion in all extremities. L BKA. No edema, cyanosis, or erythema of extremities. Neurologic: Normal speech and language. Face is symmetric. Moving all extremities. No gross focal neurologic deficits are appreciated. Skin: Skin is warm, dry and intact. No rash noted. There is a stage I ulcer located in the R inner thigh with no overlying erythema, no purulent discharge, no crepitus.  Psychiatric: Mood and affect are normal. Speech  and behavior are normal.  ____________________________________________   LABS (all labs ordered are listed, but only abnormal results are displayed)  Labs Reviewed  BASIC METABOLIC PANEL - Abnormal; Notable for the following:       Result Value   Glucose, Bld 127 (*)    BUN 52 (*)    Creatinine, Ser 6.25 (*)    GFR calc non Af Amer 9 (*)    GFR calc Af Amer 10 (*)    All other components within normal limits  CBC - Abnormal; Notable for the following:    RBC 4.16 (*)    Hemoglobin 11.2 (*)    HCT 34.9 (*)    RDW 20.2 (*)    All other components within normal limits  TROPONIN I - Abnormal; Notable for the following:    Troponin I 0.09 (*)    All other components within normal limits  PROTIME-INR - Abnormal; Notable for the following:    Prothrombin Time 15.6 (*)    All other components within normal limits  CBG MONITORING, ED   ____________________________________________  EKG  ED ECG REPORT I, Rudene Re, the attending physician, personally viewed and interpreted this ECG.  Normal sinus rhythm, rate of 75, left bundle branch block, occasional PVCs, left axis deviation, prolonged QTC, no ST elevations or depressions. Unchanged from prior from August 2018 ____________________________________________  RADIOLOGY  CXR: Cardiomegaly with unchanged mild pulmonary vascular congestion, small right pleural effusion, and right basilar atelectasis ____________________________________________   PROCEDURES  Procedure(s) performed: None Procedures Critical Care performed:  None ____________________________________________   INITIAL IMPRESSION / ASSESSMENT AND PLAN / ED COURSE  61 y.o. male with a history of ESRD on HD (MWF), CHF, anemia, PE on Coumadin, hypertension, hyperlipidemia, peripheral vascular disease who presents for evaluation of generalized weakness and lightheadedness after dialysis. This seems to be an ongoing problem for this patient and his  nephrologist is aware and has been adjusting his dialysis time. Patient is back to his baseline at this time with normal vital signs, neurologically intact, no further medical complaints. EKG shows no evidence of acute ischemia or changes  from his baseline. We'll give gentle fluid hydration with 250 ML's of normal saline, check electrolytes, check H&H to rule out anemia especially since patient is on Coumadin, we'll check an INR, we'll check troponin. We'll monitor patient on telemetry. Will discuss with his nephrologist. Patient also has a stage I ulcer in his inner thigh with no evidence of overlying infection or crepitus. We'll refer patient to his vascular team for further management.   _________________________ 12:29 PM on 03/05/2017 -----------------------------------------  Discussed patient presentation with [facility] nephrology Dr. Radene Knee who is filling in for patient's nephrologist since she is on maternity leave. She confirmed that this is not unusual for the patient, agrees with my treatment and management and will follow up with patient closely. They will continue to try to adjust patient dialysis.  _________________________ 1:38 PM on 03/05/2017 -----------------------------------------  Patient feels back to his baseline after 250 cc of fluid. He is tolerating by mouth and had lunch in the emergency department. His vitals remained stable. Okay with his nephrologist for discharge home and close follow-up. Recommended follow-up with Dr. Lucky Cowboy, patient's vascular surgeon for evaluation of ulcer seen in his leg. Discussed signs and symptoms of overlying infection and recommended return to the emergency room if you develop.    As part of my medical decision making, I reviewed the following data within the [place] EKG reviewed, Old chart reviewed, labs, Notes from prior ED visits and  Controlled Substance Database    Pertinent labs & imaging results that were available  during my care of the patient were reviewed by me and considered in my medical decision making (see chart for details).    ____________________________________________   FINAL CLINICAL IMPRESSION(S) / ED DIAGNOSES  Final diagnoses:  Hypotension of hemodialysis  Ulcer of right leg, limited to breakdown of skin ([place])      NEW MEDICATIONS STARTED DURING THIS VISIT:  New Prescriptions   No medications on file     Note:  This document was prepared using Dragon voice recognition software and may include unintentional dictation errors.    Rudene Re, MD 03/05/17 [phone]

## 2017-03-05 NOTE — Discharge Instructions (Signed)
Follow-up with your nephrologist in 1-2 days. Follow-up with Dr. Lucky Cowboy, vascular surgeon in 1 week for evaluation of your leg ulcer. Return to the emergency room if you feel like you are going to pass out, chest pain, palpitations, shortness of breath, fever, or any other symptoms concerning to you.

## 2017-03-10 NOTE — Progress Notes (Signed)
03/11/2017 5:04 PM   [name] 03-20-1956 [id number]  Referring provider: [facility], [place] [address], [place] [address]  Chief Complaint  Patient presents with  . New Patient (Initial Visit)    Balanitis referred from ER    HPI: Patient is a 61 year old African American male who is referred from [facility] ED for balanitis.  He has had a painful penis for over a month.  He states that he is not circumcised  and it is difficult to draw the foreskin back.  He only had one application of the cream and then lost the tube.  Nothing helps the pain.  Nothing makes the pain worse.  10/10 pain.  He has also noticed some drainage from the penis.    Reviewed referral notes - [name] is a 61 y.o. male who presents with pain in his penis. He notes over the last 2-3 weeks he has had worsening pain and burning starting at the tip of his penis and becoming more proximal. He reports severe pain when he tries to retract the foreskin of his penis. Denies fevers or chills. He has never had this before. No penile discharge. He has no concerns about STDs. He does have a history of end-stage renal disease and is compliant with dialysis. He also has peripheral vascular disease as detailed below  [name] is a 61 y.o. male this is at least the second attempt to dictated note on this patient the note was previously dictated as at least one other time in the computer lost it completely. Patient was seen 4 days after initial visit and diagnosis of balanitis which was treated with Doxy and a cream. Patient complains of increasing pain and discharge in his penis. Patient's penis is somewhat swollen there is a discharge she is circumcised but has no phimosis or paraphimosis. There appears to be the possibility of a exophytic lesion at the very tip of his penis. Gram stain of the discharge showed gram-negative diplococci. Patient was given Rocephin for this.  Patient was given referral to urology to follow-up.  PMH: Past Medical History:  Diagnosis Date  . 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 Amputee, below knee, left ([place])   . Anemia   . Asthma   . Cardiomyopathy ([place])   . CHF (congestive heart failure) ([place])   . Chronic systolic heart failure ([place])   . Complication of anesthesia    hypotension  . COPD (chronic obstructive pulmonary disease) ([place])   . Coronary artery disease   . Dialysis patient ([place])    Mon, Wed, Fri  . End stage renal disease ([place])   . GERD (gastroesophageal reflux disease)   . Headache   . History of kidney stones   . History of pulmonary embolism   . HLD (hyperlipidemia)   . HTN (hypertension)   . Hyperparathyroidism   . Myocardial infarction ([place])   . Peripheral vascular disease ([place])   . Shortness of breath dyspnea   . Sleep apnea    NO C-PAP, Patient stated in process of  "getting one"   . Tobacco dependence     Surgical History: Past Surgical History:  Procedure Laterality Date  . AMPUTATION Left 05/06/2014   Procedure: AMPUTATION BELOW KNEE;  Surgeon: Elam Dutch, MD;  Location: [place];  Service: Vascular;  Laterality: Left;  . AMPUTATION Right 01/12/2015   Procedure: Foot transmetatarsal amputation;  Surgeon: Corene Cornea  Bunnie Domino, MD;  Location: [facility];  Service: Vascular;  Laterality: Right;  . APPLICATION OF WOUND VAC Right 03/01/2015   Procedure: Application of Bio-connekt graft and wound vac application to right foot ;  Surgeon: Algernon Huxley, MD;  Location: [facility];  Service: Vascular;  Laterality: Right;  . AV FISTULA PLACEMENT Left   . AV FISTULA PLACEMENT Left 11/28/2016   Procedure: ARTERIOVENOUS (AV) FISTULA CREATION;  Surgeon: Algernon Huxley, MD;  Location: [facility];  Service: Vascular;  Laterality: Left;  . CARDIAC CATHETERIZATION     stent placement   . CORONARY ANGIOPLASTY    . DIALYSIS/PERMA CATHETER INSERTION N/A 07/31/2016   Procedure: Dialysis/Perma Catheter Insertion;   Surgeon: Algernon Huxley, MD;  Location: [place] [facility];  Service: Cardiovascular;  Laterality: N/A;  . LIGATION OF ARTERIOVENOUS  FISTULA Right 01/31/2016   Procedure: LIGATION OF ARTERIOVENOUS  FISTULA;  Surgeon: Algernon Huxley, MD;  Location: [facility];  Service: Vascular;  Laterality: Right;  . PERIPHERAL VASCULAR CATHETERIZATION Right 12/15/2014   Procedure: Lower Extremity Angiography;  Surgeon: Algernon Huxley, MD;  Location: [place] [facility];  Service: Cardiovascular;  Laterality: Right;  . PERIPHERAL VASCULAR CATHETERIZATION  12/15/2014   Procedure: Lower Extremity Intervention;  Surgeon: Algernon Huxley, MD;  Location: [place] [facility];  Service: Cardiovascular;;  . PERIPHERAL VASCULAR CATHETERIZATION Right 08/14/2015   Procedure: A/V Shuntogram/Fistulagram;  Surgeon: Algernon Huxley, MD;  Location: [place] [facility];  Service: Cardiovascular;  Laterality: Right;  . PERIPHERAL VASCULAR CATHETERIZATION N/A 08/14/2015   Procedure: A/V Shunt Intervention;  Surgeon: Algernon Huxley, MD;  Location: [place] [facility];  Service: Cardiovascular;  Laterality: N/A;  . PERIPHERAL VASCULAR CATHETERIZATION N/A 01/11/2016   Procedure: Dialysis/Perma Catheter Insertion;  Surgeon: Algernon Huxley, MD;  Location: [place] [facility];  Service: Cardiovascular;  Laterality: N/A;  . REVISON OF ARTERIOVENOUS FISTULA Right 02/17/2016   Procedure: removal of AV fistula;  Surgeon: Serafina Mitchell, MD;  Location: [facility];  Service: Vascular;  Laterality: Right;  . REVISON OF ARTERIOVENOUS FISTULA Right 01/31/2016   Procedure: REVISON OF ARTERIOVENOUS FISTULA ( BRACHIOCEPHALIC ) W/ ARTEGRAFT;  Surgeon: Algernon Huxley, MD;  Location: [facility];  Service: Vascular;  Laterality: Right;  . TRANSMETATARSAL AMPUTATION Right 05/04/2015   Procedure: TRANSMETATARSAL AMPUTATION REVISION, great toe amputation;  Surgeon: Algernon Huxley, MD;  Location: [facility];  Service: Vascular;  Laterality: Right;    Home Medications:  Allergies as of  03/11/2017      Reactions   Dust Mite Extract Other (See Comments)   Reaction: unknown      Medication List       Accurate as of 03/11/17  5:04 PM. Always use your most recent med list.          acetaminophen 500 MG tablet Commonly known as:  TYLENOL Take by mouth.   apixaban 5 MG Tabs tablet Commonly known as:  ELIQUIS Take 1 tablet (5 mg total) by mouth 2 (two) times daily.   aspirin EC 325 MG tablet Take by mouth.   atorvastatin 10 MG tablet Commonly known as:  LIPITOR Take 10 mg by mouth at bedtime.   budesonide-formoterol 160-4.5 MCG/ACT inhaler Commonly known as:  SYMBICORT Inhale 2 puffs into the lungs 2 (two) times daily. Reported on 08/14/2015   calcium acetate 667 MG capsule Commonly known as:  PHOSLO Take by mouth.   carvedilol 3.125 MG tablet Commonly known as:  COREG Take  1 tablet (3.125 mg total) by mouth 2 (two) times daily with a meal.   cefUROXime 250 MG tablet Commonly known as:  CEFTIN Take 1 tablet (250 mg total) by mouth daily. X 7 days   cefUROXime 250 MG tablet Commonly known as:  CEFTIN Take 1 tablet (250 mg total) by mouth every Monday, Wednesday, and Friday with hemodialysis. x7 days   cetirizine 10 MG tablet Commonly known as:  ZYRTEC Take 10 mg by mouth daily.   COMBIVENT RESPIMAT 20-100 MCG/ACT Aers respimat Generic drug:  Ipratropium-Albuterol Inhale 1 puff into the lungs every 6 (six) hours as needed for wheezing or shortness of breath.   docusate sodium 100 MG capsule Commonly known as:  COLACE Take by mouth.   fluticasone 50 MCG/ACT nasal spray Commonly known as:  FLONASE Place into the nose.   guaifenesin 100 MG/5ML syrup Commonly known as:  ROBITUSSIN Take 200 mg by mouth every 6 (six) hours as needed for cough.   isosorbide mononitrate 30 MG 24 hr tablet Commonly known as:  IMDUR Take by mouth.   menthol-cetylpyridinium 3 MG lozenge Commonly known as:  CEPACOL Take 1 lozenge (3 mg total) by mouth as needed  for sore throat.   midodrine 10 MG tablet Commonly known as:  PROAMATINE Take 5 mg by mouth 2 (two) times daily.   MULTI-VITAMINS Tabs Take by mouth.   multivitamin with minerals Tabs tablet Take 1 tablet by mouth 2 (two) times daily.   nitroGLYCERIN 0.4 MG SL tablet Commonly known as:  NITROSTAT Place 0.4 mg under the tongue every 5 (five) minutes as needed for chest pain.   nystatin ointment Commonly known as:  MYCOSTATIN Apply 1 application topically 2 (two) times daily.   nystatin-triamcinolone ointment Commonly known as:  MYCOLOG Apply 1 application topically 2 (two) times daily.   oxycodone 5 MG capsule Commonly known as:  OXY-IR Take by mouth.   pantoprazole 20 MG tablet Commonly known as:  PROTONIX Take 20 mg by mouth daily.   PERCOCET 5-325 MG tablet Generic drug:  oxyCODONE-acetaminophen Take by mouth.   polyethylene glycol packet Commonly known as:  MIRALAX / GLYCOLAX Take by mouth.   pregabalin 75 MG capsule Commonly known as:  LYRICA Take 1 capsule (75 mg total) by mouth daily.   RENAGEL 800 MG tablet Generic drug:  sevelamer Take by mouth.   sevelamer carbonate 800 MG tablet Commonly known as:  RENVELA Take 800 mg by mouth 2 (two) times daily.   tiotropium 18 MCG inhalation capsule Commonly known as:  SPIRIVA Place 18 mcg into inhaler and inhale daily.   traMADol 50 MG tablet Commonly known as:  ULTRAM Take 1 tablet (50 mg total) by mouth every 6 (six) hours as needed for moderate pain.   warfarin 3 MG tablet Commonly known as:  COUMADIN Take by mouth.       Allergies:  Allergies  Allergen Reactions  . Dust Mite Extract Other (See Comments)    Reaction: unknown    Family History: Family History  Problem Relation Age of Onset  . Heart failure Other   . Hypertension Other   . Leukemia Other   . Diabetes Other   . Prostate cancer Neg Hx   . Kidney cancer Neg Hx   . Bladder Cancer Neg Hx     Social History:  reports that  he has been smoking Cigarettes.  He has been smoking about 0.25 packs per day. He has never used smokeless tobacco. He reports  that he does not drink alcohol or use drugs.  ROS: UROLOGY Frequent Urination?: No Hard to postpone urination?: No Burning/pain with urination?: Yes Get up at night to urinate?: No Leakage of urine?: No Urine stream starts and stops?: No Trouble starting stream?: No Do you have to strain to urinate?: Yes Blood in urine?: No Urinary tract infection?: No Sexually transmitted disease?: No Injury to kidneys or bladder?: Yes Painful intercourse?: No Weak stream?: No Erection problems?: No Penile pain?: No  Gastrointestinal Nausea?: No Vomiting?: No Indigestion/heartburn?: No Diarrhea?: Yes Constipation?: No  Constitutional Fever: No Night sweats?: No Weight loss?: No Fatigue?: No  Skin Skin rash/lesions?: No Itching?: No  Eyes Blurred vision?: No Double vision?: No  Ears/Nose/Throat Sore throat?: No Sinus problems?: Yes  Hematologic/Lymphatic Swollen glands?: No Easy bruising?: No  Cardiovascular Leg swelling?: No Chest pain?: Yes  Respiratory Cough?: Yes Shortness of breath?: Yes  Endocrine Excessive thirst?: No  Musculoskeletal Back pain?: No Joint pain?: No  Neurological Headaches?: No Dizziness?: No  Psychologic Depression?: No Anxiety?: No  Physical Exam: BP 110/76   Pulse 76   Ht 6\' 2"  (1.88 m)   Wt 168 lb (76.2 kg)   BMI 21.57 kg/m   Constitutional: Well nourished. Alert and oriented, No acute distress. HEENT: [place] AT, moist mucus membranes. Trachea midline, no masses. Cardiovascular: No clubbing, cyanosis, or edema. Respiratory: Normal respiratory effort, no increased work of breathing. GI: Abdomen is soft, non tender, non distended, no abdominal masses. Liver and spleen not palpable.  No hernias appreciated.  Stool sample for occult testing is not indicated.   GU: No CVA tenderness.  No bladder fullness  or masses.  Patient with uncircumcised phallus. Foreskin easily retracted  Urethral meatus is patent.  Dorsal surface of the glans is sloughed off with healthy granulation tissue forming.  Scrotum without lesions, cysts, rashes and/or edema.  Testicles are located scrotally bilaterally. No masses are appreciated in the testicles. Left and right epididymis are normal. Rectal: Deferred.   Skin: No rashes, bruises or suspicious lesions. Lymph: No cervical or inguinal adenopathy. Neurologic: Grossly intact, no focal deficits, moving all 4 extremities. Psychiatric: Normal mood and affect.  Laboratory Data: Lab Results  Component Value Date   WBC 8.3 03/05/2017   HGB 11.2 (L) 03/05/2017   HCT 34.9 (L) 03/05/2017   MCV 83.8 03/05/2017   PLT 186 03/05/2017    Lab Results  Component Value Date   CREATININE 6.25 (H) 03/05/2017    Lab Results  Component Value Date   HGBA1C 5.1 08/21/2016    Lab Results  Component Value Date   TSH 0.286 (L) 11/11/2016       Component Value Date/Time   CHOL 131 07/26/2016 0904   HDL 45 07/26/2016 0904   CHOLHDL 2.9 07/26/2016 0904   VLDL 39 07/26/2016 0904   LDLCALC 47 07/26/2016 0904    Lab Results  Component Value Date   AST 45 (H) 02/10/2017   Lab Results  Component Value Date   ALT 30 02/10/2017     Assessment & Plan:    1. Penile calciphylaxis  - Explained to the patient the condition of penile calciphylaxis   - contacted his nephrologist - [phone]  Dr. Doyne Keel A. [place] - he will evaluate the patient  - referred to the wound clinic   Return for refer to wound clinic and nephrology.  These notes generated with voice recognition software. I apologize for typographical errors.  Zara Council, PA-C  [facility]  [address] [place], [place] [address] [phone]

## 2017-03-11 ENCOUNTER — Ambulatory Visit (INDEPENDENT_AMBULATORY_CARE_PROVIDER_SITE_OTHER): Payer: Medicare Other | Admitting: Urology

## 2017-03-11 ENCOUNTER — Encounter: Payer: Self-pay | Admitting: Urology

## 2017-03-11 DIAGNOSIS — I248 Other forms of acute ischemic heart disease: Secondary | ICD-10-CM

## 2017-03-19 ENCOUNTER — Ambulatory Visit: Payer: Medicare Other | Admitting: Internal Medicine

## 2017-03-20 ENCOUNTER — Ambulatory Visit: Payer: Medicare Other | Admitting: Surgery

## 2017-03-31 ENCOUNTER — Encounter: Payer: Medicare Other | Attending: Internal Medicine | Admitting: Surgery

## 2017-03-31 DIAGNOSIS — Z992 Dependence on renal dialysis: Secondary | ICD-10-CM | POA: Diagnosis not present

## 2017-03-31 DIAGNOSIS — I132 Hypertensive heart and chronic kidney disease with heart failure and with stage 5 chronic kidney disease, or end stage renal disease: Secondary | ICD-10-CM | POA: Diagnosis not present

## 2017-03-31 DIAGNOSIS — F17218 Nicotine dependence, cigarettes, with other nicotine-induced disorders: Secondary | ICD-10-CM | POA: Diagnosis not present

## 2017-03-31 DIAGNOSIS — Z89512 Acquired absence of left leg below knee: Secondary | ICD-10-CM | POA: Diagnosis not present

## 2017-03-31 DIAGNOSIS — L942 Calcinosis cutis: Secondary | ICD-10-CM | POA: Diagnosis not present

## 2017-03-31 DIAGNOSIS — N186 End stage renal disease: Secondary | ICD-10-CM | POA: Diagnosis not present

## 2017-03-31 DIAGNOSIS — I2510 Atherosclerotic heart disease of native coronary artery without angina pectoris: Secondary | ICD-10-CM | POA: Diagnosis not present

## 2017-03-31 DIAGNOSIS — N485 Ulcer of penis: Secondary | ICD-10-CM | POA: Diagnosis not present

## 2017-03-31 DIAGNOSIS — J449 Chronic obstructive pulmonary disease, unspecified: Secondary | ICD-10-CM | POA: Insufficient documentation

## 2017-03-31 DIAGNOSIS — I509 Heart failure, unspecified: Secondary | ICD-10-CM | POA: Insufficient documentation

## 2017-03-31 DIAGNOSIS — I429 Cardiomyopathy, unspecified: Secondary | ICD-10-CM | POA: Diagnosis not present

## 2017-03-31 DIAGNOSIS — Z7982 Long term (current) use of aspirin: Secondary | ICD-10-CM | POA: Insufficient documentation

## 2017-03-31 DIAGNOSIS — Z79899 Other long term (current) drug therapy: Secondary | ICD-10-CM | POA: Insufficient documentation

## 2017-03-31 DIAGNOSIS — Z7901 Long term (current) use of anticoagulants: Secondary | ICD-10-CM | POA: Diagnosis not present

## 2017-03-31 DIAGNOSIS — E11622 Type 2 diabetes mellitus with other skin ulcer: Secondary | ICD-10-CM | POA: Diagnosis present

## 2017-03-31 DIAGNOSIS — G4730 Sleep apnea, unspecified: Secondary | ICD-10-CM | POA: Insufficient documentation

## 2017-03-31 NOTE — Progress Notes (Addendum)
[name], [name] ([id number]) Visit Report for 03/31/2017 Chief Complaint Document Details Patient Name: [name], [name]. Date of Service: 03/31/2017 12:30 PM Medical Record Number: [id number] Patient Account Number: [id number] Date of Birth/Sex: 02-19-1956 (61 y.o. Male) Treating RN: Montey Hora Primary Care Provider: Tomasa Hose Other Clinician: Referring Provider: Zara Council Treating Provider/Extender: Frann Rider in Treatment: 0 Information Obtained from: Patient Chief Complaint patient presents to the wound center having been referred by the urology team for a painful ulcer on his penis, which she's had for over a month Electronic Signature(s) Signed: 03/31/2017 1:33:33 PM By: Christin Fudge MD, FACS Entered By: Christin Fudge on 03/31/2017 13:33:32 [name], [name] ([id number]) -------------------------------------------------------------------------------- HPI Details Patient Name: [name], [name] Date of Service: 03/31/2017 12:30 PM Medical Record Number: [id number] Patient Account Number: [id number] Date of Birth/Sex: [birth year] (61 y.o. Male) Treating RN: Montey Hora Primary Care Provider: Tomasa Hose Other Clinician: Referring Provider: Zara Council Treating Provider/Extender: Frann Rider in Treatment: 0 History of Present Illness Location: painful ulcer on the penis Quality: Patient reports experiencing a sharp pain to affected area(s). Severity: Patient states wound are getting worse. Duration: Patient has had the wound for > one month prior to seeking treatment at the wound center Timing: Pain in wound is constant (hurts all the time) Context: The wound appeared gradually over time Modifying Factors: Consults to this date include: urology HPI Description: 61 year old male who was seen by others 2-1/2 years ago when he was being treated for vascular problems and dry gangrene of his right foot now returns with painful lesion on his penis  for about a month. He was seen by the physicians assistant at the urology practice,who noted a easily retracted foreskin, patent urethral meatus and on the dorsal surface of the glands there was slough with healthy granulation tissue formation. 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 the patient is a smoker smoking about 10 cigarettes a day. past medical history of amputation of the right foot, left foot amputation, cardiomyopathy, CHF, hemodialysis dependent, coronary artery disease, tobacco addiction. ======== Old Notes: Seen by Dr. Leotis Pain on 08/29/2014 with ulceration on 2 toes on the right foot and dark discoloration worrisome for gangrenous changes on his 2nd and 3rd toes as well.o Given his previous history of below-knee amputation for peripheral disease and ulceration on the left, his multiple atherosclerotic risk factors and his poorly palpable pedal pulses, he was brought in for angiography for further evaluation and potential treatment. He had a percutaneous angioplasty of the right popliteal artery and the right anterior tibial artery and dorsalis pedis artery. He also had angioplasty of the right peroneal artery and the proximal and mid right posterior tibial artery. On 11/07/2014 he's had a duplex of the right lower extremity which showed the ABI was 0.9 and had a multiphasic waveform. Impression was of mild peripheral vascular disease predominantly small vessel disease right ankle. 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 11/22/2014 -- he has been off cigarettes completely since Saturday and is on a nicotine patch and have commended him about this. 12/08/2014 -- he was seen in the vascular office at [place] and has been scheduled for a angiogram at the end of this month -- next Thursday. I have urged him to keep this  appointment. 12/30/2014 -- since I saw him last on July 14 he had a  procedure done by Dr. Lucky Cowboy on 12/15/2014. he had a aortogram and selective right lower extremity angiogram followed by a percutaneous angioplasty of the right popliteal artery, right anterior tibial artery, right peroneal artery, proximal and mid right posterior tibial artery. 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 Today he continues to have pain and he says that some of his toes are black. ========= Electronic Signature(s) Signed: 03/31/2017 1:34:14 PM By: Christin Fudge MD, FACS Previous Signature: 03/31/2017 1:03:51 PM Version By: Christin Fudge MD, FACS [name] ([id number]) Previous Signature: 03/31/2017 1:03:34 PM Version By: Christin Fudge MD, FACS Previous Signature: 03/31/2017 12:52:43 PM Version By: Christin Fudge MD, FACS Entered By: Christin Fudge on 03/31/2017 13:34:13 [name] ([id number]) -------------------------------------------------------------------------------- Physical Exam Details Patient Name: [name], [name] Date of Service: 03/31/2017 12:30 PM Medical Record Number: [id number] Patient Account Number: [id number] Date of Birth/Sex: 29-Nov-1955 (61 y.o. Male) Treating RN: Montey Hora Primary Care Provider: Tomasa Hose Other Clinician: Referring Provider: Zara Council Treating Provider/Extender: Frann Rider in Treatment: 0 Constitutional . Pulse regular. Respirations normal and unlabored. Afebrile. . Eyes Nonicteric. Reactive to light. Ears, Nose, Mouth, and Throat Lips, teeth, and gums WNL.[name] Moist mucosa without lesions. Neck supple and nontender. No palpable supraclavicular or cervical adenopathy. Normal sized without goiter. Respiratory WNL. No retractions.. Cardiovascular Pedal Pulses WNL. No clubbing, cyanosis or  edema. Gastrointestinal (GI) Abdomen without masses or tenderness.. No liver or spleen enlargement or tenderness.. Genitourinary (GU) No hydrocele, spermatocele, tenderness of the cord, or testicular mass.. the patient has a necrotic area on the glans penis with areas of gangrene on the coronal part of the glans.. Lymphatic No adneopathy. No adenopathy. No adenopathy. Musculoskeletal Adexa without tenderness or enlargement.. Digits and nails w/o clubbing, cyanosis, infection, petechiae, ischemia, or inflammatory conditions.. Integumentary (Hair, Skin) No suspicious lesions. No crepitus or fluctuance. No peri-wound warmth or erythema. No masses.[name] Psychiatric Judgement and insight Intact.. No evidence of depression, anxiety, or agitation.. Notes the patient has a necrotic ulcerated area on the glans penis with areas of gangrene on the coronal part of the glans. area is very tender Electronic Signature(s) Signed: 03/31/2017 1:35:24 PM By: Christin Fudge MD, FACS Entered By: Christin Fudge on 03/31/2017 13:35:24 Mihok, [name] ([id number]) -------------------------------------------------------------------------------- Physician Orders Details Patient Name: [name], [name] Date of Service: 03/31/2017 12:30 PM Medical Record Number: [id number] Patient Account Number: [id number] Date of Birth/Sex: Nov 08, 1955 (61 y.o. 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 Date of Service: 03/31/2017 12:30 PM Medical Record Number: [id number] Patient Account Number: [id number] Date of Birth/Sex: [birth year] (61 y.o. 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 Date of Service: 03/31/2017 12:30 PM Medical Record Number: [id number] Patient Account Number: [id number] Date of Birth/Sex: 1955-08-09 (61 y.o. Male) Treating RN: Montey Hora Primary Care Provider: Tomasa Hose Other Clinician: Referring Provider: Zara Council Treating Provider/Extender: Frann Rider in Treatment: 0 Subjective Chief Complaint Information obtained from Patient patient presents to the wound center having been referred by the urology team for a painful  ulcer on his penis, which she's had for over a month History of Present Illness (HPI) The following HPI elements were documented for the patient's wound: Location: painful ulcer on the penis Quality: Patient reports experiencing a sharp pain to affected area(s). Severity: Patient states wound are getting worse. Duration: Patient has had the wound for > one month prior to seeking treatment at the wound center Timing: Pain in wound is constant (hurts all the time) Context: The wound appeared gradually over time Modifying Factors: Consults to this date include: urology 61 year old male who was seen by others 2-1/2 years ago when he was being treated for vascular problems and dry gangrene of his right foot now returns with painful lesion on his penis for about a month. He was seen by the physicians assistant at the urology practice,who noted a easily retracted foreskin, patent urethral meatus and on the dorsal surface of the glands there was slough with healthy granulation tissue formation. 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 the patient is a smoker smoking about 10 cigarettes a day. past medical history of amputation of the right foot, left foot amputation, cardiomyopathy, CHF, hemodialysis dependent, coronary artery disease, tobacco addiction. ======== Old Notes: Seen by Dr. Leotis Pain on 08/29/2014 with ulceration on 2 toes on the right foot and dark discoloration worrisome for gangrenous changes on his 2nd and 3rd toes as well. Given his previous history of below-knee amputation for peripheral disease and ulceration on the left, his multiple atherosclerotic risk factors and his poorly palpable pedal pulses, he was brought in for angiography for further evaluation and potential treatment. He had a percutaneous angioplasty of the right popliteal artery and the right anterior tibial artery and dorsalis pedis  artery. He also had angioplasty of the right peroneal artery and the proximal and mid right posterior tibial artery. On 11/07/2014 he's had a duplex of the right lower extremity which showed the ABI was 0.9 and had a multiphasic waveform. Impression was of mild peripheral vascular disease predominantly small vessel disease right ankle. 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 11/22/2014 -- he has been off cigarettes completely since Saturday and is on a nicotine patch and have commended him about this. 12/08/2014 -- he was seen in the vascular office at [place] and has been scheduled for a angiogram at the end of this month -- next Thursday. I have urged him to keep  this appointment. 12/30/2014 -- since I saw him last on July 14 he had a procedure done by Dr. Lucky Cowboy on 12/15/2014. he had a aortogram and selective right lower extremity angiogram followed by a percutaneous angioplasty of the right popliteal artery, right anterior Amason, Nikolaj N. ([id number]) tibial artery, right peroneal artery, proximal and mid right posterior tibial artery. 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 Today he continues to have pain and he says that some of his toes are black. ========= Wound History Patient presents with 1 open wound that has been present for approximately 2 months. Patient has been treating wound in the following manner: open to air. Laboratory tests have been performed in the last month. Patient reportedly has not tested positive for an antibiotic resistant organism. Patient reportedly has not tested positive for osteomyelitis. Patient reportedly has not had testing performed to evaluate circulation in the legs. Patient History Information obtained from  Patient. Allergies No Known Allergies Family History Cancer - Paternal Grandparents, Diabetes - Father, Heart Disease - Father, Hypertension - Father, No family history of Hereditary Spherocytosis, Kidney Disease, Lung Disease, Seizures, Stroke, Thyroid Problems, Tuberculosis. Social History Current every day smoker, Marital Status - Widowed, Alcohol Use - Never, Drug Use - Prior History, Caffeine Use - Never. Medical History Respiratory Patient has history of Chronic Obstructive Pulmonary Disease (COPD), Sleep Apnea Cardiovascular Patient has history of Congestive Heart Failure, Myocardial Infarction, Peripheral Venous Disease Hospitalization/Surgery History - 06/27/2014, [place], L BKA. Medical And Surgical History Notes Musculoskeletal L BKA Medications midodrine 10 mg tablet oral tablet oral carvedilol 3.125 mg tablet oral tablet oral aspirin 325 mg tablet oral tablet oral acetaminophen 500 mg tablet oral tablet oral cetirizine 10 mg tablet oral tablet oral atorvastatin 10 mg tablet oral tablet oral Combivent Respimat 20 mcg-100 mcg/actuation solution for inhalation inhalation mist inhalation Symbicort 160 mcg-4.5 mcg/actuation HFA aerosol inhaler inhalation HFA aerosol inhaler inhalation cefuroxime axetil 250 mg tablet oral tablet oral Eliquis 5 mg tablet oral tablet oral calcium acetate 667 mg tablet oral tablet oral Newbern, [name] ([id number]) guaifenesin 100 mg/5 mL oral liquid oral liquid oral docusate sodium 100 mg capsule oral capsule oral Cepacol Sore Throat (benzocaine-menthol) 15 mg-3.6 mg lozenges mucous membrane lozenge mucous membrane Multiple Vitamin-Minerals tablet oral tablet oral Multiple Vitamins tablet oral tablet oral fluticasone 50 mcg/actuation nasal spray,suspension nasal spray,suspension nasal isosorbide mononitrate ER 30 mg tablet,extended release 24 hr oral tablet extended release 24 hr oral Nitrostat 0.4 mg sublingual tablet sublingual tablet,  sublingual sublingual Objective Constitutional Pulse regular. Respirations normal and unlabored. Afebrile. Vitals Time Taken: 12:47 PM, Height: 74 in, Source: Measured, Weight: 226 lbs, Source: Measured, BMI: 29, Temperature: 97.9 F, Pulse: 91 bpm, Respiratory Rate: 16 breaths/min, Blood Pressure: 118/84 mmHg. Eyes Nonicteric. Reactive to light. Ears, Nose, Mouth, and Throat Lips, teeth, and gums WNL.[name] Moist mucosa without lesions. Neck supple and nontender. No palpable supraclavicular or cervical adenopathy. Normal sized without goiter. Respiratory WNL. No retractions.. Cardiovascular Pedal Pulses WNL. No clubbing, cyanosis or edema. Gastrointestinal (GI) Abdomen without masses or tenderness.. No liver or spleen enlargement or tenderness.. Genitourinary (GU) No hydrocele, spermatocele, tenderness of the cord, or testicular mass.. the patient has a necrotic area on the glans penis with areas of gangrene on the coronal part of the glans.. Lymphatic No adneopathy. No adenopathy. No adenopathy. Musculoskeletal Adexa without  tenderness or enlargement.. Digits and nails w/o clubbing, cyanosis, infection, petechiae, ischemia, or inflammatory conditions.[name] Psychiatric Judgement and insight Intact.. No evidence of depression, anxiety, or agitation.. General Notes: the patient has a necrotic ulcerated area on the glans penis with areas of gangrene on the coronal part of the glans. area is very tender Integumentary (Hair, Skin) [name], [name] ([id number]) No suspicious lesions. No crepitus or fluctuance. No peri-wound warmth or erythema. No masses.. Other Condition(s) Patient presents with Other Dermatologic Condition located on the Penis head. The skin appearance exhibited: Excoriation. The skin appearance did not exhibit: Atrophie Blanche, Callus, Crepitus, Cyanosis, Dry/Scaly, Ecchymosis, Erythema, Friable, Hemosiderin Staining, Induration, Maceration, Mottled, Pallor, Rash, Rubor,  Scarring. There is tenderness on palpation. General Notes: 2.5 x 1.5 reddened area , whitish thick drainage,and a dark blacken area noted on the left side of the penis head as well. 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 The patient was referred by the urology practice at [facility] and has been seen by the PA Zara Council and I called her to discuss the management. after review, I believe the management of this condition is beyond the scope of a wound care center. The nephrologists treating the patient appropriately with prior sulfates during dialysis would be an option and I have clearly indicated that hyperbaric oxygen therapy is not known to help this condition. The other option would be for a partial amputation of the penis and we will defer this decision to the urologist Dr. Hollice Espy. 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 The patient was referred by the urology practice at [facility] and has been seen by the PA Zara Council and I called her to discuss the management. Draeger, JAILIN MOOMAW. ([id number]) after review, I believe the management of this condition is beyond the scope of a wound care center. The nephrologists treating the patient  appropriately with prior sulfates during dialysis would be an option and I have clearly indicated that hyperbaric oxygen therapy is not known to help this condition. The other option would be for a partial amputation of the penis and we will defer this decision to the urologist Dr. Hollice Espy. I have discussed this with the patient and his caregiver and they're agreeable to follow-up with the urology office Electronic Signature(s) Signed: 04/03/2017 8:09:25 AM By: Christin Fudge MD, FACS Previous Signature: 03/31/2017 1:37:51 PM Version By: Christin Fudge MD, FACS Entered By: Christin Fudge on 04/03/2017 08:09:25 [name], [name] ([id number]) -------------------------------------------------------------------------------- ROS/PFSH Details Patient Name: [name], [name] Date of Service: 03/31/2017 12:30 PM Medical Record Number: [id number] Patient Account Number: [id number] Date of Birth/Sex: [birth year] (61 y.o. Male) Treating RN: Montey Hora Primary Care Provider: Tomasa Hose Other Clinician: Referring Provider: Zara Council Treating Provider/Extender: Frann Rider in Treatment: 0 Information Obtained From Patient Wound History Do you currently have one or more open woundso Yes How many open  wounds do you currently haveo 1 Approximately how long have you had your woundso 2 months How have you been treating your wound(s) until nowo open to air Has your wound(s) ever healed and then re-openedo No Have you had any lab work done in the past montho Yes Who ordered the lab work doneo HD Have you tested positive for an antibiotic resistant organism (MRSA, VRE)o No Have you tested positive for osteomyelitis (bone infection)o No Have you had any tests for circulation on your legso No Eyes Medical History: Negative for: Cataracts; Glaucoma; Optic Neuritis Ear/Nose/Mouth/Throat Medical History: Negative for: Chronic sinus problems/congestion; Middle ear  problems Hematologic/Lymphatic Medical History: Positive for: Anemia Negative for: Hemophilia; Human Immunodeficiency Virus; Lymphedema; Sickle Cell Disease Respiratory Medical History: Positive for: Asthma; Chronic Obstructive Pulmonary Disease (COPD); Sleep Apnea Negative for: Aspiration; Pneumothorax; Tuberculosis Cardiovascular Medical History: Positive for: Congestive Heart Failure; Coronary Artery Disease; Hypertension; Myocardial Infarction; Peripheral Venous Disease Negative for: Angina; Arrhythmia; Deep Vein Thrombosis; Hypotension; Peripheral Arterial Disease; Phlebitis; Vasculitis Gastrointestinal Medical History: Negative for: Cirrhosis ; Colitis; Crohnos; Hepatitis A; Hepatitis B; Hepatitis C Endocrine [name], Joel RAINERI. ([id number]) Medical History: Positive for: Type II Diabetes Negative for: Type I Diabetes Time with diabetes: 6 months Treated with: Diet Blood sugar tested every day: Yes Tested : Genitourinary Medical History: Positive for: End Stage Renal Disease - HD MWF Immunological Medical History: Negative for: Lupus Erythematosus; Raynaudos; Scleroderma Integumentary (Skin) Medical History: Negative for: History of Burn; History of pressure wounds Musculoskeletal Medical History: Negative for: Gout; Rheumatoid Arthritis; Osteoarthritis; Osteomyelitis Past Medical History Notes: L BKA Neurologic Medical History: Positive for: Neuropathy Negative for: Dementia; Quadriplegia; Paraplegia; Seizure Disorder Oncologic Medical History: Negative for: Received Chemotherapy; Received Radiation Psychiatric Medical History: Negative for: Anorexia/bulimia; Confinement Anxiety Immunizations Pneumococcal Vaccine: Received Pneumococcal Vaccination: Yes Implantable Devices Hospitalization / Surgery History Name of Hospital Purpose of Hospitalization/Surgery Date [facility] L BKA 06/27/2014 Family and Social History Cancer: Yes - Paternal Grandparents; Diabetes:  Yes - Father; Heart Disease: Yes - Father; Hereditary Spherocytosis: No; Hypertension: Yes - Father; Kidney Disease: No; Lung Disease: No; Seizures: No; Stroke: No; Thyroid Problems: No; [name], [name] ([id number]) Tuberculosis: No; Current every day smoker; Marital Status - Widowed; Alcohol Use: Never; Drug Use: Prior History; Caffeine Use: Never; Financial Concerns: No; Food, Clothing or Shelter Needs: No; Support System Lacking: No; Transportation Concerns: No; Advanced Directives: No; Patient does not want information on Advanced Directives Physician Affirmation I have reviewed and agree with the above information. Electronic Signature(s) Signed: 03/31/2017 4:32:35 PM By: Christin Fudge MD, FACS Signed: 03/31/2017 5:07:14 PM By: Montey Hora Entered By: Christin Fudge on 03/31/2017 13:22:43 [name], [name] ([id number]) -------------------------------------------------------------------------------- [place] Details Patient Name: [name], Simon N. Date of Service: 03/31/2017 Medical Record Number: [id number] Patient Account Number: [id number] Date of Birth/Sex: [birth year] (61 y.o. 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, cigarettes, with other nicotine-induced disorders Facility Procedures CPT4 Code: 94174081 Description: 99213 - WOUND CARE VISIT-LEV 3 EST PT Modifier: Quantity: 1 Physician Procedures CPT4 Code: 4481856 Description: 31497 - WC PHYS LEVEL 4 - EST PT ICD-10 Diagnosis Description E11.622 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

## 2017-04-01 NOTE — Progress Notes (Signed)
Fok, COLON RUETH ([id number]) Visit Report for 03/31/2017 Abuse/Suicide Risk Screen Details Patient Name: Tallerico, KENDARIUS VIGEN. Date of Service: 03/31/2017 12:30 PM Medical Record Number: [id number] Patient Account Number: [id number] Date of Birth/Sex: Mar 12, 1956 (61 y.o. Male) Treating RN: Montey Hora Primary Care Dravin Lance: Tomasa Hose Other Clinician: Referring Recie Cirrincione: Zara Council Treating Levar Fayson/Extender: Frann Rider in Treatment: 0 Abuse/Suicide Risk Screen Items Answer ABUSE/SUICIDE RISK SCREEN: Has anyone close to you tried to hurt or harm you recentlyo No Do you feel uncomfortable with anyone in your familyo No Has anyone forced you do things that you didnot want to doo No Do you have any thoughts of harming yourselfo No Patient displays signs or symptoms of abuse and/or neglect. No Electronic Signature(s) Signed: 03/31/2017 5:07:14 PM By: Montey Hora Entered By: Montey Hora on 03/31/2017 12:49:09 Raj, [name] ([id number]) -------------------------------------------------------------------------------- Activities of Daily Living Details Patient Name: Sumpter, Kahil N. Date of Service: 03/31/2017 12:30 PM Medical Record Number: [id number] Patient Account Number: [id number] Date of Birth/Sex: 1956/02/11 (61 y.o. Male) Treating RN: Montey Hora Primary Care Janki Dike: Tomasa Hose Other Clinician: Referring Wauneta Silveria: Zara Council Treating Lisandra Mathisen/Extender: Frann Rider in Treatment: 0 Activities of Daily Living Items Answer Activities of Daily Living (Please select one for each item) Drive Automobile Not Able Take Medications Completely Able Use Telephone Completely Able Care for Appearance Need Assistance Use Toilet Completely Able Bath / Shower Need Assistance Dress Self Need Assistance Feed Self Completely Able Walk Not Able Get In / Out Bed Need Assistance Housework Need Assistance Prepare Meals Completely Winkler for Self Need Assistance Electronic Signature(s) Signed: 03/31/2017 5:07:14 PM By: Montey Hora Entered By: Montey Hora on 03/31/2017 12:49:49 Fyock, [name] ([id number]) -------------------------------------------------------------------------------- Education Assessment Details Patient Name: Koors, Advit N. Date of Service: 03/31/2017 12:30 PM Medical Record Number: [id number] Patient Account Number: [id number] Date of Birth/Sex: Feb 20, 1956 (61 y.o. Male) Treating RN: Montey Hora Primary Care Rowe Warman: Tomasa Hose Other Clinician: Referring Nathaneil Feagans: Zara Council Treating Jamill Wetmore/Extender: Frann Rider in Treatment: 0 Primary Learner Assessed: Patient Learning Preferences/Education Level/Primary Language Learning Preference: Explanation, Demonstration Highest Education Level: High School Preferred Language: English Cognitive Barrier Assessment/Beliefs Language Barrier: No Translator Needed: No Memory Deficit: No Emotional Barrier: No Cultural/Religious Beliefs Affecting Medical Care: No Physical Barrier Assessment Impaired Vision: No Impaired Hearing: No Decreased Hand dexterity: No Knowledge/Comprehension Assessment Knowledge Level: Medium Comprehension Level: Medium Ability to understand written Medium instructions: Ability to understand verbal Medium instructions: Motivation Assessment Anxiety Level: Calm Cooperation: Cooperative Education Importance: Acknowledges Need Interest in Health Problems: Asks Questions Perception: Coherent Willingness to Engage in Self- Medium Management Activities: Readiness to Engage in Self- Medium Management Activities: Electronic Signature(s) Signed: 03/31/2017 5:07:14 PM By: Montey Hora Entered By: Montey Hora on 03/31/2017 12:50:13 Steines, [name] ([id number]) -------------------------------------------------------------------------------- Fall Risk Assessment  Details Patient Name: Varden, [name] Date of Service: 03/31/2017 12:30 PM Medical Record Number: [id number] Patient Account Number: [id number] Date of Birth/Sex: 1955-12-30 (61 y.o. Male) Treating RN: Montey Hora Primary Care Vernis Cabacungan: Tomasa Hose Other Clinician: Referring Berit Raczkowski: Zara Council Treating Evelise Reine/Extender: Frann Rider in Treatment: 0 Fall Risk Assessment Items Have you had 2 or more falls in the last 12 monthso 0 No Have you had any fall that resulted in injury in the last 12 monthso 0 No FALL RISK ASSESSMENT: History of falling - immediate or within 3 months 0 No Secondary diagnosis 0 No Ambulatory aid None/bed rest/wheelchair/nurse 0 Yes Crutches/cane/walker 0 No Furniture 0 No IV Access/Saline Lock  0 No Gait/Training Normal/bed rest/immobile 0 No Weak 10 Yes Impaired 0 No Mental Status Oriented to own ability 0 Yes Electronic Signature(s) Signed: 03/31/2017 5:07:14 PM By: Montey Hora Entered By: Montey Hora on 03/31/2017 12:50:42 Schnoebelen, [name] ([id number]) -------------------------------------------------------------------------------- Nutrition Risk Assessment Details Patient Name: Tarr, Jacier N. Date of Service: 03/31/2017 12:30 PM Medical Record Number: [id number] Patient Account Number: [id number] Date of Birth/Sex: [birth year] (61 y.o. Male) Treating RN: Montey Hora Primary Care Gabriana Wilmott: Tomasa Hose Other Clinician: Referring Lousie Calico: Zara Council Treating Donis Kotowski/Extender: Frann Rider in Treatment: 0 Height (in): 74 Weight (lbs): 226 Body Mass Index (BMI): 29 Nutrition Risk Assessment Items NUTRITION RISK SCREEN: I have an illness or condition that made me change the kind and/or amount of 0 No food I eat I eat fewer than two meals per day 0 No I eat few fruits and vegetables, or milk products 0 No I have three or more drinks of beer, liquor or wine almost every day 0 No I have tooth or mouth  problems that make it hard for me to eat 0 No I don't always have enough money to buy the food I need 0 No I eat alone most of the time 0 No I take three or more different prescribed or over-the-counter drugs a day 1 Yes Without wanting to, I have lost or gained 10 pounds in the last six months 0 No I am not always physically able to shop, cook and/or feed myself 0 No Nutrition Protocols Good Risk Protocol 0 No interventions needed Moderate Risk Protocol Electronic Signature(s) Signed: 03/31/2017 5:07:14 PM By: Montey Hora Entered By: Montey Hora on 03/31/2017 12:50:49

## 2017-04-02 NOTE — Progress Notes (Signed)
[name] ([id number]) Visit Report for 03/31/2017 Allergy List Details Patient Name: [name], [name]. Date of Service: 03/31/2017 12:30 PM Medical Record Number: [id number] Patient Account Number: [id number] Date of Birth/Sex: Dec 07, 1955 (61 y.o. Male) Treating RN: Montey Hora Primary Care Zakariyya Helfman: Tomasa Hose Other Clinician: Referring Edric Fetterman: Zara Council Treating Natalina Wieting/Extender: Frann Rider in Treatment: 0 Allergies Active Allergies No Known Allergies Allergy Notes Electronic Signature(s) Signed: 03/31/2017 5:07:14 PM By: Montey Hora Entered By: Montey Hora on 03/31/2017 12:48:33 Tippens, [name] ([id number]) -------------------------------------------------------------------------------- Arrival Information Details Patient Name: [name], [name] Date of Service: 03/31/2017 12:30 PM Medical Record Number: [id number] Patient Account Number: [id number] Date of Birth/Sex: 1956/05/21 (61 y.o. Male) Treating RN: Montey Hora Primary Care Jermany Sundell: Tomasa Hose Other Clinician: Referring Londyn Hotard: Zara Council Treating Bradd Merlos/Extender: Frann Rider in Treatment: 0 Visit Information Patient Arrived: Wheel Chair Arrival Time: 12:44 Accompanied By: self Transfer Assistance: None Patient Identification Verified: Yes Secondary Verification Process Yes Completed: Patient Has Alerts: Yes Patient Alerts: Patient on Blood Thinner Eliquis History Since Last Visit Added or deleted any medications: No Any new allergies or adverse reactions: No Had a fall or experienced change in activities of daily living that may affect risk of falls: No Signs or symptoms of abuse/neglect since last visito No Hospitalized since last visit: No Electronic Signature(s) Signed: 03/31/2017 5:07:14 PM By: Montey Hora Entered By: Montey Hora on 03/31/2017 12:48:52 Dwyer, [name]  ([id number]) -------------------------------------------------------------------------------- Clinic Level of Care Assessment Details Patient Name: [name], [name] Date of Service: 03/31/2017 12:30 PM Medical Record Number: [id number] Patient Account Number: [id number] Date of Birth/Sex: 1955-06-17 (61 y.o. Male) Treating RN: Roger Shelter Primary Care Nyana [name]: Tomasa Hose Other Clinician: Referring Dezarae Mcclaran: Zara Council Treating Yonna Alwin/Extender: Frann Rider in Treatment: 0 Clinic Level of Care Assessment Items TOOL 2 Quantity Score []  - Use when only an EandM is performed on the INITIAL visit 0 ASSESSMENTS - Nursing Assessment / Reassessment X - General Physical Exam (combine w/ comprehensive assessment (listed just below) when 1 20 performed on new pt. evals) X- 1 25 Comprehensive Assessment (HX, ROS, Risk Assessments, Wounds Hx, etc.) ASSESSMENTS - Wound and Skin Assessment / Reassessment []  - Simple Wound Assessment / Reassessment - one wound 0 []  - 0 Complex Wound Assessment / Reassessment - multiple wounds X- 1 10 Dermatologic / Skin Assessment (not related to wound area) ASSESSMENTS - Ostomy and/or Continence Assessment and Care []  - Incontinence Assessment and Management 0 []  - 0 Ostomy Care Assessment and Management (repouching, etc.) PROCESS - Coordination of Care X - Simple Patient / Family Education for ongoing care 1 15 []  - 0 Complex (extensive) Patient / Family Education for ongoing care []  - 0 Staff obtains Programmer, systems, Records, Test Results / Process Orders []  - 0 Staff telephones HHA, Nursing Homes / Clarify orders / etc []  - 0 Routine Transfer to another Facility (non-emergent condition) []  - 0 Routine Hospital Admission (non-emergent condition) X- 1 15 New Admissions / Biomedical engineer / Ordering NPWT, Apligraf, etc. []  - 0 Emergency Hospital Admission (emergent condition) X- 1 10 Simple Discharge Coordination []  - 0 Complex  (extensive) Discharge Coordination PROCESS - Special Needs []  - Pediatric / Minor Patient Management 0 []  - 0 Isolation Patient Management Cleere, Issachar N. ([id number]) []  - 0 Hearing / Language / Visual special needs []  - 0 Assessment of Community assistance (transportation, D/C planning, etc.) []  - 0 Additional assistance / Altered mentation []  - 0 Support Surface(s) Assessment (bed, cushion, seat, etc.) INTERVENTIONS -  Wound Cleansing / Measurement X - Wound Imaging (photographs - any number of wounds) 1 5 []  - 0 Wound Tracing (instead of photographs) X- 1 5 Simple Wound Measurement - one wound []  - 0 Complex Wound Measurement - multiple wounds X- 1 5 Simple Wound Cleansing - one wound []  - 0 Complex Wound Cleansing - multiple wounds INTERVENTIONS - Wound Dressings []  - Small Wound Dressing one or multiple wounds 0 []  - 0 Medium Wound Dressing one or multiple wounds []  - 0 Large Wound Dressing one or multiple wounds []  - 0 Application of Medications - injection INTERVENTIONS - Miscellaneous []  - External ear exam 0 []  - 0 Specimen Collection (cultures, biopsies, blood, body fluids, etc.) []  - 0 Specimen(s) / Culture(s) sent or taken to Lab for analysis []  - 0 Patient Transfer (multiple staff / Civil Service fast streamer / Similar devices) []  - 0 Simple Staple / Suture removal (25 or less) []  - 0 Complex Staple / Suture removal (26 or more) []  - 0 Hypo / Hyperglycemic Management (close monitor of Blood Glucose) []  - 0 Ankle / Brachial Index (ABI) - do not check if billed separately Has the patient been seen at the hospital within the last three years: Yes Total Score: 110 Level Of Care: New/Established - Level 3 Electronic Signature(s) Signed: 04/01/2017 4:24:00 PM By: Roger Shelter Entered By: Roger Shelter on 03/31/2017 13:36:22 Tramble, [name] ([id number]) -------------------------------------------------------------------------------- Encounter Discharge  Information Details Patient Name: [name], [name] Date of Service: 03/31/2017 12:30 PM Medical Record Number: [id number] Patient Account Number: [id number] Date of Birth/Sex: 06/01/1955 (61 y.o. Male) Treating RN: Roger Shelter Primary Care Dereonna Lensing: Tomasa Hose Other Clinician: Referring Chanan Detwiler: Zara Council Treating Sencere Symonette/Extender: Frann Rider in Treatment: 0 Encounter Discharge Information Items Discharge Pain Level: 0 Discharge Condition: Stable Ambulatory Status: Wheelchair Discharge Destination: Home Private Transportation: Auto Accompanied By: caregiver Schedule Follow-up Appointment: No Medication Reconciliation completed and provided No to Patient/Care Enolia Koepke: Clinical Summary of Care: Electronic Signature(s) Signed: 03/31/2017 1:37:42 PM By: Roger Shelter Entered By: Roger Shelter on 03/31/2017 13:37:41 Hillman, [name] ([id number]) -------------------------------------------------------------------------------- Multi Wound Chart Details Patient Name: Swaim, [name] Date of Service: 03/31/2017 12:30 PM Medical Record Number: [id number] Patient Account Number: [id number] Date of Birth/Sex: [birth year] (61 y.o. Male) Treating RN: Montey Hora Primary Care Normand Damron: Tomasa Hose Other Clinician: Referring Austine Wiedeman: Zara Council Treating Layanna Charo/Extender: Frann Rider in Treatment: 0 Vital Signs Height(in): 74 Pulse(bpm): 91 Weight(lbs): 226 Blood Pressure(mmHg): 118/84 Body Mass Index(BMI): 29 Temperature(F): 97.9 Respiratory Rate 16 (breaths/min): Wound Assessments Treatment Notes Electronic Signature(s) Signed: 03/31/2017 1:25:00 PM By: Christin Fudge MD, FACS Entered By: Christin Fudge on 03/31/2017 13:25:00 Klostermann, [name] ([id number]) -------------------------------------------------------------------------------- Non-Wound Condition Assessment Details Patient Name: Gasca, Cully N. Date of Service:  03/31/2017 12:30 PM Medical Record Number: [id number] Patient Account Number: [id number] Date of Birth/Sex: 21-May-1956 (61 y.o. Male) Treating RN: Roger Shelter Primary Care Kimmie Berggren: Tomasa Hose Other Clinician: Referring Fong Mccarry: Zara Council Treating Marwah Disbro/Extender: Frann Rider in Treatment: 0 Non-Wound Condition: Condition: Other Dermatologic Condition Location: Other: Penis head Side: Photos Periwound Skin Texture Texture Color No Abnormalities Noted: No No Abnormalities Noted: No Callus: No Atrophie Blanche: No Crepitus: No Cyanosis: No Excoriation: Yes Ecchymosis: No Friable: No Erythema: No Induration: No Hemosiderin Staining: No Rash: No Mottled: No Scarring: No Pallor: No Rubor: No Moisture No Abnormalities Noted: No Temperature / Pain Dry / Scaly: No Tenderness on Palpation: Yes Maceration: No Notes 2.5 x 1.5 reddened area , whitish thick drainage,and a dark blacken  area noted on the left side of the penis head as well. Electronic Signature(s) Signed: 04/01/2017 4:45:03 PM By: Montey Hora Previous Signature: 03/31/2017 1:27:50 PM Version By: Roger Shelter Entered By: Montey Hora on 04/01/2017 16:45:03 Moncur, [name] ([id number]) -------------------------------------------------------------------------------- Pain Assessment Details Patient Name: Randa, Kaedan N. Date of Service: 03/31/2017 12:30 PM Medical Record Number: [id number] Patient Account Number: [id number] Date of Birth/Sex: October 31, 1955 (61 y.o. Male) Treating RN: Montey Hora Primary Care Evaleigh Mccamy: Tomasa Hose Other Clinician: Referring Murray Guzzetta: Zara Council Treating Sion Thane/Extender: Frann Rider in Treatment: 0 Active Problems Location of Pain Severity and Description of Pain Patient Has Paino No Site Locations Pain Management and Medication Current Pain Management: Electronic Signature(s) Signed: 03/31/2017 5:07:14 PM By: Montey Hora Entered By: Montey Hora on 03/31/2017 12:47:03 Baltazar, [name] ([id number]) -------------------------------------------------------------------------------- Patient/Caregiver Education Details Patient Name: Postema, Valton N. Date of Service: 03/31/2017 12:30 PM Medical Record Number: [id number] Patient Account Number: [id number] Date of Birth/Gender: 11-10-1955 (61 y.o. Male) Treating RN: Roger Shelter Primary Care Physician: Tomasa Hose Other Clinician: Referring Physician: Zara Council Treating Physician/Extender: Frann Rider in Treatment: 0 Education Assessment Education Provided To: Patient and Caregiver Education Topics Provided Notes patient advised to follow up with urologist. Electronic Signature(s) Signed: 04/01/2017 4:24:00 PM By: Roger Shelter Entered By: Roger Shelter on 03/31/2017 13:38:59 Bernales, [name] ([id number]) -------------------------------------------------------------------------------- [place] Details Patient Name: Quevedo, [name] Date of Service: 03/31/2017 12:30 PM Medical Record Number: [id number] Patient Account Number: [id number] Date of Birth/Sex: 04-Jan-1956 (61 y.o. Male) Treating RN: Montey Hora Primary Care Eliana Lueth: Tomasa Hose Other Clinician: Referring Keyan Folson: Zara Council Treating Jency Schnieders/Extender: Frann Rider in Treatment: 0 Vital Signs Time Taken: 12:47 Temperature (F): 97.9 Height (in): 74 Pulse (bpm): 91 Source: Measured Respiratory Rate (breaths/min): 16 Weight (lbs): 226 Blood Pressure (mmHg): 118/84 Source: Measured Reference Range: 80 - 120 mg / dl Body Mass Index (BMI): 29 Electronic Signature(s) Signed: 03/31/2017 5:07:14 PM By: Montey Hora Entered By: Montey Hora on 03/31/2017 12:47:38

## 2017-04-07 ENCOUNTER — Inpatient Hospital Stay (HOSPITAL_COMMUNITY)
Admission: EM | Admit: 2017-04-07 | Discharge: 2017-04-10 | DRG: 640 | Disposition: A | Discharge status: Skilled Nursing Facility | Payer: Medicare Other | Attending: Family Medicine | Admitting: Family Medicine

## 2017-04-07 ENCOUNTER — Inpatient Hospital Stay (HOSPITAL_COMMUNITY): Payer: Medicare Other

## 2017-04-07 ENCOUNTER — Emergency Department (HOSPITAL_COMMUNITY): Payer: Medicare Other

## 2017-04-07 ENCOUNTER — Other Ambulatory Visit: Payer: Self-pay

## 2017-04-07 ENCOUNTER — Encounter (HOSPITAL_COMMUNITY): Payer: Self-pay | Admitting: Emergency Medicine

## 2017-04-07 DIAGNOSIS — Z9115 Patient's noncompliance with renal dialysis: Secondary | ICD-10-CM | POA: Diagnosis not present

## 2017-04-07 DIAGNOSIS — Z992 Dependence on renal dialysis: Secondary | ICD-10-CM | POA: Diagnosis not present

## 2017-04-07 DIAGNOSIS — D631 Anemia in chronic kidney disease: Secondary | ICD-10-CM | POA: Diagnosis present

## 2017-04-07 DIAGNOSIS — R0902 Hypoxemia: Secondary | ICD-10-CM | POA: Diagnosis present

## 2017-04-07 DIAGNOSIS — Z89512 Acquired absence of left leg below knee: Secondary | ICD-10-CM

## 2017-04-07 DIAGNOSIS — I739 Peripheral vascular disease, unspecified: Secondary | ICD-10-CM | POA: Diagnosis present

## 2017-04-07 DIAGNOSIS — Z6821 Body mass index (BMI) 21.0-21.9, adult: Secondary | ICD-10-CM | POA: Diagnosis not present

## 2017-04-07 DIAGNOSIS — Z7951 Long term (current) use of inhaled steroids: Secondary | ICD-10-CM

## 2017-04-07 DIAGNOSIS — I252 Old myocardial infarction: Secondary | ICD-10-CM

## 2017-04-07 DIAGNOSIS — Z8701 Personal history of pneumonia (recurrent): Secondary | ICD-10-CM

## 2017-04-07 DIAGNOSIS — I429 Cardiomyopathy, unspecified: Secondary | ICD-10-CM | POA: Diagnosis present

## 2017-04-07 DIAGNOSIS — I2510 Atherosclerotic heart disease of native coronary artery without angina pectoris: Secondary | ICD-10-CM | POA: Diagnosis present

## 2017-04-07 DIAGNOSIS — Y838 Other surgical procedures as the cause of abnormal reaction of the patient, or of later complication, without mention of misadventure at the time of the procedure: Secondary | ICD-10-CM | POA: Diagnosis present

## 2017-04-07 DIAGNOSIS — Z9981 Dependence on supplemental oxygen: Secondary | ICD-10-CM

## 2017-04-07 DIAGNOSIS — I132 Hypertensive heart and chronic kidney disease with heart failure and with stage 5 chronic kidney disease, or end stage renal disease: Secondary | ICD-10-CM | POA: Diagnosis present

## 2017-04-07 DIAGNOSIS — G4733 Obstructive sleep apnea (adult) (pediatric): Secondary | ICD-10-CM | POA: Diagnosis present

## 2017-04-07 DIAGNOSIS — R0681 Apnea, not elsewhere classified: Secondary | ICD-10-CM | POA: Diagnosis not present

## 2017-04-07 DIAGNOSIS — R4182 Altered mental status, unspecified: Secondary | ICD-10-CM

## 2017-04-07 DIAGNOSIS — E8770 Fluid overload, unspecified: Secondary | ICD-10-CM | POA: Diagnosis not present

## 2017-04-07 DIAGNOSIS — E875 Hyperkalemia: Principal | ICD-10-CM | POA: Diagnosis present

## 2017-04-07 DIAGNOSIS — Z7901 Long term (current) use of anticoagulants: Secondary | ICD-10-CM

## 2017-04-07 DIAGNOSIS — N186 End stage renal disease: Secondary | ICD-10-CM | POA: Diagnosis present

## 2017-04-07 DIAGNOSIS — I5022 Chronic systolic (congestive) heart failure: Secondary | ICD-10-CM | POA: Diagnosis present

## 2017-04-07 DIAGNOSIS — K219 Gastro-esophageal reflux disease without esophagitis: Secondary | ICD-10-CM | POA: Diagnosis present

## 2017-04-07 DIAGNOSIS — E441 Mild protein-calorie malnutrition: Secondary | ICD-10-CM | POA: Diagnosis present

## 2017-04-07 DIAGNOSIS — R0603 Acute respiratory distress: Secondary | ICD-10-CM | POA: Diagnosis present

## 2017-04-07 DIAGNOSIS — T82898A Other specified complication of vascular prosthetic devices, implants and grafts, initial encounter: Secondary | ICD-10-CM | POA: Diagnosis present

## 2017-04-07 DIAGNOSIS — E8889 Other specified metabolic disorders: Secondary | ICD-10-CM | POA: Diagnosis present

## 2017-04-07 DIAGNOSIS — Z87442 Personal history of urinary calculi: Secondary | ICD-10-CM

## 2017-04-07 DIAGNOSIS — J449 Chronic obstructive pulmonary disease, unspecified: Secondary | ICD-10-CM | POA: Diagnosis present

## 2017-04-07 DIAGNOSIS — Z9861 Coronary angioplasty status: Secondary | ICD-10-CM

## 2017-04-07 DIAGNOSIS — E785 Hyperlipidemia, unspecified: Secondary | ICD-10-CM | POA: Diagnosis present

## 2017-04-07 DIAGNOSIS — T8241XA Breakdown (mechanical) of vascular dialysis catheter, initial encounter: Secondary | ICD-10-CM

## 2017-04-07 DIAGNOSIS — Z833 Family history of diabetes mellitus: Secondary | ICD-10-CM

## 2017-04-07 DIAGNOSIS — N2581 Secondary hyperparathyroidism of renal origin: Secondary | ICD-10-CM | POA: Diagnosis present

## 2017-04-07 DIAGNOSIS — R509 Fever, unspecified: Secondary | ICD-10-CM | POA: Diagnosis present

## 2017-04-07 DIAGNOSIS — Z8249 Family history of ischemic heart disease and other diseases of the circulatory system: Secondary | ICD-10-CM

## 2017-04-07 DIAGNOSIS — F17210 Nicotine dependence, cigarettes, uncomplicated: Secondary | ICD-10-CM | POA: Diagnosis present

## 2017-04-07 DIAGNOSIS — Z86711 Personal history of pulmonary embolism: Secondary | ICD-10-CM

## 2017-04-07 DIAGNOSIS — Z7982 Long term (current) use of aspirin: Secondary | ICD-10-CM

## 2017-04-07 DIAGNOSIS — Z79899 Other long term (current) drug therapy: Secondary | ICD-10-CM

## 2017-04-07 DIAGNOSIS — Z79891 Long term (current) use of opiate analgesic: Secondary | ICD-10-CM

## 2017-04-07 LAB — CBC WITH DIFFERENTIAL/PLATELET
BASOS ABS: 0 10*3/uL (ref 0.0–0.1)
BASOS PCT: 0 %
EOS ABS: 0 10*3/uL (ref 0.0–0.7)
EOS PCT: 0 %
HCT: 38.5 % — ABNORMAL LOW (ref 39.0–52.0)
Hemoglobin: 12.3 g/dL — ABNORMAL LOW (ref 13.0–17.0)
LYMPHS ABS: 2 10*3/uL (ref 0.7–4.0)
Lymphocytes Relative: 24 %
MCH: 26.5 pg (ref 26.0–34.0)
MCHC: 31.9 g/dL (ref 30.0–36.0)
MCV: 82.8 fL (ref 78.0–100.0)
Monocytes Absolute: 0.5 10*3/uL (ref 0.1–1.0)
Monocytes Relative: 7 %
Neutro Abs: 5.6 10*3/uL (ref 1.7–7.7)
Neutrophils Relative %: 69 %
Platelets: 156 10*3/uL (ref 150–400)
RBC: 4.65 MIL/uL (ref 4.22–5.81)
RDW: 19.4 % — ABNORMAL HIGH (ref 11.5–15.5)
WBC: 8.2 10*3/uL (ref 4.0–10.5)

## 2017-04-07 LAB — RENAL FUNCTION PANEL
Albumin: 3.2 g/dL — ABNORMAL LOW (ref 3.5–5.0)
Anion gap: 15 (ref 5–15)
BUN: 63 mg/dL — AB (ref 6–20)
CALCIUM: 9.5 mg/dL (ref 8.9–10.3)
CO2: 24 mmol/L (ref 22–32)
CREATININE: 7.57 mg/dL — AB (ref 0.61–1.24)
Chloride: 96 mmol/L — ABNORMAL LOW (ref 101–111)
GFR calc Af Amer: 8 mL/min — ABNORMAL LOW (ref >60)
GFR calc non Af Amer: 7 mL/min — ABNORMAL LOW (ref >60)
GLUCOSE: 71 mg/dL (ref 65–99)
Phosphorus: 4.2 mg/dL (ref 2.5–4.6)
Potassium: 5.3 mmol/L — ABNORMAL HIGH (ref 3.5–5.1)
SODIUM: 135 mmol/L (ref 135–145)

## 2017-04-07 LAB — BLOOD GAS, ARTERIAL
ACID-BASE DEFICIT: 2.7 mmol/L — AB (ref 0.0–2.0)
Bicarbonate: 21 mmol/L (ref 20.0–28.0)
DELIVERY SYSTEMS: POSITIVE
EXPIRATORY PAP: 5
FIO2: 40
INSPIRATORY PAP: 16
O2 Saturation: 98.9 %
PATIENT TEMPERATURE: 98.6
RATE: 10 resp/min
pCO2 arterial: 32.8 mmHg (ref 32.0–48.0)
pH, Arterial: 7.423 (ref 7.350–7.450)
pO2, Arterial: 145 mmHg — ABNORMAL HIGH (ref 83.0–108.0)

## 2017-04-07 LAB — I-STAT CHEM 8, ED
BUN: 78 mg/dL — AB (ref 6–20)
CALCIUM ION: 1.22 mmol/L (ref 1.15–1.40)
CREATININE: 9.2 mg/dL — AB (ref 0.61–1.24)
Chloride: 103 mmol/L (ref 101–111)
Glucose, Bld: 73 mg/dL (ref 65–99)
HEMATOCRIT: 45 % (ref 39.0–52.0)
Hemoglobin: 15.3 g/dL (ref 13.0–17.0)
Potassium: 5.9 mmol/L — ABNORMAL HIGH (ref 3.5–5.1)
Sodium: 138 mmol/L (ref 135–145)
TCO2: 25 mmol/L (ref 22–32)

## 2017-04-07 LAB — CBG MONITORING, ED
GLUCOSE-CAPILLARY: 88 mg/dL (ref 65–99)
Glucose-Capillary: 138 mg/dL — ABNORMAL HIGH (ref 65–99)

## 2017-04-07 LAB — COMPREHENSIVE METABOLIC PANEL
ALT: 22 U/L (ref 17–63)
AST: 27 U/L (ref 15–41)
Albumin: 2.9 g/dL — ABNORMAL LOW (ref 3.5–5.0)
Alkaline Phosphatase: 84 U/L (ref 38–126)
Anion gap: 16 — ABNORMAL HIGH (ref 5–15)
BILIRUBIN TOTAL: 0.9 mg/dL (ref 0.3–1.2)
BUN: 147 mg/dL — AB (ref 6–20)
CO2: 16 mmol/L — ABNORMAL LOW (ref 22–32)
Calcium: 10.5 mg/dL — ABNORMAL HIGH (ref 8.9–10.3)
Chloride: 103 mmol/L (ref 101–111)
Creatinine, Ser: 14.23 mg/dL — ABNORMAL HIGH (ref 0.61–1.24)
GFR calc Af Amer: 4 mL/min — ABNORMAL LOW (ref >60)
GFR, EST NON AFRICAN AMERICAN: 3 mL/min — AB (ref >60)
Glucose, Bld: 108 mg/dL — ABNORMAL HIGH (ref 65–99)
Sodium: 135 mmol/L (ref 135–145)
TOTAL PROTEIN: 8.2 g/dL — AB (ref 6.5–8.1)

## 2017-04-07 LAB — GLUCOSE, CAPILLARY
GLUCOSE-CAPILLARY: 94 mg/dL (ref 65–99)
GLUCOSE-CAPILLARY: 90 mg/dL (ref 65–99)
Glucose-Capillary: 49 mg/dL — ABNORMAL LOW (ref 65–99)
Glucose-Capillary: 47 mg/dL — ABNORMAL LOW (ref 65–99)

## 2017-04-07 LAB — BRAIN NATRIURETIC PEPTIDE

## 2017-04-07 LAB — BASIC METABOLIC PANEL
ANION GAP: 14 (ref 5–15)
BUN: 78 mg/dL — ABNORMAL HIGH (ref 6–20)
CHLORIDE: 100 mmol/L — AB (ref 101–111)
CO2: 21 mmol/L — ABNORMAL LOW (ref 22–32)
CREATININE: 9.57 mg/dL — AB (ref 0.61–1.24)
Calcium: 9.1 mg/dL (ref 8.9–10.3)
GFR calc non Af Amer: 5 mL/min — ABNORMAL LOW (ref >60)
GFR, EST AFRICAN AMERICAN: 6 mL/min — AB (ref >60)
Glucose, Bld: 58 mg/dL — ABNORMAL LOW (ref 65–99)
Potassium: 5.9 mmol/L — ABNORMAL HIGH (ref 3.5–5.1)
SODIUM: 135 mmol/L (ref 135–145)

## 2017-04-07 LAB — I-STAT TROPONIN, ED: TROPONIN I, POC: 0.1 ng/mL — AB (ref 0.00–0.08)

## 2017-04-07 LAB — MRSA PCR SCREENING: MRSA BY PCR: NEGATIVE

## 2017-04-07 MED ORDER — HEPARIN SODIUM (PORCINE) 1000 UNIT/ML DIALYSIS: 1000.0000 [IU] | INTRAMUSCULAR | Status: DC | PRN

## 2017-04-07 MED ORDER — SODIUM BICARBONATE 8.4 % IV SOLN
50.0000 meq | Freq: Once | INTRAVENOUS | Status: AC
  Administered 2017-04-07: 50 meq via INTRAVENOUS
  Filled 2017-04-07: qty 50

## 2017-04-07 MED ORDER — DEXTROSE 5 % IV SOLN
2.0000 g | INTRAVENOUS | Status: DC
  Administered 2017-04-07: 2 g via INTRAVENOUS
  Filled 2017-04-07 (×2): qty 2

## 2017-04-07 MED ORDER — NITROGLYCERIN 0.4 MG SL SUBL: 0.4000 mg | SUBLINGUAL_TABLET | SUBLINGUAL | Status: DC | PRN

## 2017-04-07 MED ORDER — INSULIN ASPART 100 UNIT/ML ~~LOC~~ SOLN
SUBCUTANEOUS | Status: AC
  Filled 2017-04-07: qty 1

## 2017-04-07 MED ORDER — INSULIN ASPART 100 UNIT/ML ~~LOC~~ SOLN
10.0000 [IU] | Freq: Once | SUBCUTANEOUS | Status: AC
  Administered 2017-04-07: 10 [IU] via INTRAVENOUS

## 2017-04-07 MED ORDER — FLUTICASONE PROPIONATE 50 MCG/ACT NA SUSP
1.0000 | Freq: Every day | NASAL | Status: DC
  Administered 2017-04-09: 1 via NASAL
  Filled 2017-04-07 (×2): qty 16

## 2017-04-07 MED ORDER — GUAIFENESIN 100 MG/5ML PO SYRP
200.0000 mg | ORAL_SOLUTION | Freq: Four times a day (QID) | ORAL | Status: DC | PRN
  Filled 2017-04-07: qty 10

## 2017-04-07 MED ORDER — DEXTROSE 50 % IV SOLN
INTRAVENOUS | Status: AC
  Filled 2017-04-07: qty 50

## 2017-04-07 MED ORDER — CALCIUM ACETATE (PHOS BINDER) 667 MG PO CAPS
667.0000 mg | ORAL_CAPSULE | Freq: Three times a day (TID) | ORAL | Status: DC
  Administered 2017-04-07 – 2017-04-10 (×8): 667 mg via ORAL
  Filled 2017-04-07 (×8): qty 1

## 2017-04-07 MED ORDER — SODIUM CHLORIDE 0.9 % IV SOLN: 100.0000 mL | INTRAVENOUS | Status: DC | PRN

## 2017-04-07 MED ORDER — DEXTROSE 50 % IV SOLN
INTRAVENOUS | Status: AC
  Administered 2017-04-07: 25 mL
  Filled 2017-04-07: qty 50

## 2017-04-07 MED ORDER — SEVELAMER CARBONATE 800 MG PO TABS: 800.0000 mg | ORAL_TABLET | Freq: Two times a day (BID) | ORAL | Status: DC

## 2017-04-07 MED ORDER — PANTOPRAZOLE SODIUM 20 MG PO TBEC
20.0000 mg | DELAYED_RELEASE_TABLET | Freq: Every day | ORAL | Status: DC
  Administered 2017-04-07 – 2017-04-10 (×3): 20 mg via ORAL
  Filled 2017-04-07 (×3): qty 1

## 2017-04-07 MED ORDER — ASPIRIN EC 81 MG PO TBEC
81.0000 mg | DELAYED_RELEASE_TABLET | Freq: Every day | ORAL | Status: DC
  Administered 2017-04-07 – 2017-04-10 (×4): 81 mg via ORAL
  Filled 2017-04-07 (×4): qty 1

## 2017-04-07 MED ORDER — APIXABAN 5 MG PO TABS
5.0000 mg | ORAL_TABLET | Freq: Two times a day (BID) | ORAL | Status: DC
  Administered 2017-04-07 – 2017-04-10 (×6): 5 mg via ORAL
  Filled 2017-04-07 (×7): qty 1

## 2017-04-07 MED ORDER — VANCOMYCIN HCL 10 G IV SOLR
1500.0000 mg | Freq: Once | INTRAVENOUS | Status: AC
  Filled 2017-04-07: qty 1500

## 2017-04-07 MED ORDER — DOCUSATE SODIUM 50 MG PO CAPS
50.0000 mg | ORAL_CAPSULE | Freq: Every day | ORAL | Status: DC
  Administered 2017-04-07: 50 mg via ORAL
  Filled 2017-04-07 (×4): qty 1

## 2017-04-07 MED ORDER — TIOTROPIUM BROMIDE MONOHYDRATE 18 MCG IN CAPS
18.0000 ug | ORAL_CAPSULE | Freq: Every day | RESPIRATORY_TRACT | Status: DC
  Administered 2017-04-07 – 2017-04-10 (×3): 18 ug via RESPIRATORY_TRACT
  Filled 2017-04-07: qty 5

## 2017-04-07 MED ORDER — LORATADINE 10 MG PO TABS
10.0000 mg | ORAL_TABLET | Freq: Every day | ORAL | Status: DC
  Administered 2017-04-07 – 2017-04-10 (×4): 10 mg via ORAL
  Filled 2017-04-07 (×4): qty 1

## 2017-04-07 MED ORDER — ALTEPLASE 2 MG IJ SOLR: 2.0000 mg | Freq: Once | INTRAMUSCULAR | Status: DC | PRN

## 2017-04-07 MED ORDER — SEVELAMER CARBONATE 800 MG PO TABS
800.0000 mg | ORAL_TABLET | Freq: Two times a day (BID) | ORAL | Status: DC
  Administered 2017-04-07 – 2017-04-10 (×5): 800 mg via ORAL
  Filled 2017-04-07 (×5): qty 1

## 2017-04-07 MED ORDER — LIDOCAINE HCL (PF) 1 % IJ SOLN: 5.0000 mL | INTRAMUSCULAR | Status: DC | PRN

## 2017-04-07 MED ORDER — ALBUTEROL SULFATE (2.5 MG/3ML) 0.083% IN NEBU: 2.5000 mg | INHALATION_SOLUTION | Freq: Four times a day (QID) | RESPIRATORY_TRACT | Status: DC | PRN

## 2017-04-07 MED ORDER — DEXTROSE 50 % IV SOLN
50.0000 mL | Freq: Once | INTRAVENOUS | Status: AC
  Administered 2017-04-07: 50 mL via INTRAVENOUS

## 2017-04-07 MED ORDER — ATORVASTATIN CALCIUM 10 MG PO TABS
10.0000 mg | ORAL_TABLET | Freq: Every day | ORAL | Status: DC
  Administered 2017-04-07 – 2017-04-10 (×3): 10 mg via ORAL
  Filled 2017-04-07 (×3): qty 1

## 2017-04-07 MED ORDER — PENTAFLUOROPROP-TETRAFLUOROETH EX AERO: 1.0000 "application " | INHALATION_SPRAY | CUTANEOUS | Status: DC | PRN

## 2017-04-07 MED ORDER — ISOSORBIDE MONONITRATE ER 30 MG PO TB24
30.0000 mg | ORAL_TABLET | Freq: Every day | ORAL | Status: DC
  Administered 2017-04-07 – 2017-04-10 (×4): 30 mg via ORAL
  Filled 2017-04-07 (×4): qty 1

## 2017-04-07 MED ORDER — OXYCODONE-ACETAMINOPHEN 5-325 MG PO TABS: 1.0000 | ORAL_TABLET | Freq: Four times a day (QID) | ORAL | Status: DC | PRN

## 2017-04-07 MED ORDER — ALBUTEROL SULFATE (2.5 MG/3ML) 0.083% IN NEBU
5.0000 mg | INHALATION_SOLUTION | Freq: Once | RESPIRATORY_TRACT | Status: AC
  Administered 2017-04-07: 5 mg via RESPIRATORY_TRACT
  Filled 2017-04-07: qty 6

## 2017-04-07 MED ORDER — POLYETHYLENE GLYCOL 3350 17 G PO PACK: 17.0000 g | PACK | Freq: Every day | ORAL | Status: DC | PRN

## 2017-04-07 MED ORDER — HEPARIN SODIUM (PORCINE) 1000 UNIT/ML DIALYSIS: 20.0000 [IU]/kg | INTRAMUSCULAR | Status: DC | PRN

## 2017-04-07 MED ORDER — LIDOCAINE-PRILOCAINE 2.5-2.5 % EX CREA: 1.0000 "application " | TOPICAL_CREAM | CUTANEOUS | Status: DC | PRN

## 2017-04-07 MED ORDER — MIDODRINE HCL 5 MG PO TABS
5.0000 mg | ORAL_TABLET | Freq: Two times a day (BID) | ORAL | Status: DC
  Administered 2017-04-07 – 2017-04-10 (×6): 5 mg via ORAL
  Filled 2017-04-07 (×6): qty 1

## 2017-04-07 MED ORDER — CARVEDILOL 3.125 MG PO TABS
3.1250 mg | ORAL_TABLET | Freq: Two times a day (BID) | ORAL | Status: DC
  Administered 2017-04-07 – 2017-04-10 (×3): 3.125 mg via ORAL
  Filled 2017-04-07 (×3): qty 1

## 2017-04-07 MED ORDER — IPRATROPIUM-ALBUTEROL 0.5-2.5 (3) MG/3ML IN SOLN: 3.0000 mL | Freq: Four times a day (QID) | RESPIRATORY_TRACT | Status: DC | PRN

## 2017-04-07 MED ORDER — PREGABALIN 25 MG PO CAPS
75.0000 mg | ORAL_CAPSULE | Freq: Every day | ORAL | Status: DC
  Administered 2017-04-07 – 2017-04-10 (×3): 75 mg via ORAL
  Filled 2017-04-07 (×3): qty 1

## 2017-04-07 MED ORDER — IPRATROPIUM-ALBUTEROL 0.5-2.5 (3) MG/3ML IN SOLN
3.0000 mL | Freq: Once | RESPIRATORY_TRACT | Status: AC
  Administered 2017-04-07: 3 mL via RESPIRATORY_TRACT
  Filled 2017-04-07: qty 3

## 2017-04-07 MED ORDER — MOMETASONE FURO-FORMOTEROL FUM 200-5 MCG/ACT IN AERO
2.0000 | INHALATION_SPRAY | Freq: Two times a day (BID) | RESPIRATORY_TRACT | Status: DC
  Administered 2017-04-09 – 2017-04-10 (×2): 2 via RESPIRATORY_TRACT
  Filled 2017-04-07 (×2): qty 8.8

## 2017-04-07 MED ORDER — TRAMADOL HCL 50 MG PO TABS: 50.0000 mg | ORAL_TABLET | Freq: Four times a day (QID) | ORAL | Status: DC | PRN

## 2017-04-07 MED ORDER — IPRATROPIUM-ALBUTEROL 20-100 MCG/ACT IN AERS
1.0000 | INHALATION_SPRAY | Freq: Four times a day (QID) | RESPIRATORY_TRACT | Status: DC | PRN
  Filled 2017-04-07: qty 4

## 2017-04-07 MED ORDER — DEXTROSE 5 % IV SOLN
2.0000 g | Freq: Once | INTRAVENOUS | Status: DC
  Filled 2017-04-07: qty 2

## 2017-04-07 MED ORDER — ADULT MULTIVITAMIN W/MINERALS CH
1.0000 | ORAL_TABLET | Freq: Every day | ORAL | Status: DC
  Administered 2017-04-07 – 2017-04-10 (×3): 1 via ORAL
  Filled 2017-04-07 (×3): qty 1

## 2017-04-07 NOTE — Progress Notes (Signed)
Pt received to unit from HD on room air. Pt had sustained periods of apnea while napping which resolved upon awakening pt.  HD RN reported that pt was on BiPAP during dialysis, O2 sats at 100%, pt arrived on unit without BiPaP. Notified Lupita Dawn, RRT about periods of apnea.

## 2017-04-07 NOTE — ED Notes (Signed)
ED Provider at bedside. 

## 2017-04-07 NOTE — Progress Notes (Signed)
Patient transported from ED to dialysis without any complication.

## 2017-04-07 NOTE — Progress Notes (Signed)
Hypoglycemic Event  CBG: 49  Treatment: 15 GM carbohydrate snack  Symptoms: Hungry  Follow-up CBG: Time:1822 CBG Result:94  Possible Reasons for Event: Inadequate meal intake  Comments/MD notified:text page    Payton Emerald

## 2017-04-07 NOTE — ED Triage Notes (Signed)
Pt BIB EMS from a friend's house. Per EMS, pt's friend told them that pt missed dialysis on Friday. Friend is poor historian for many details, but reported that pt was "okay" when he went to bed tonight, then woke up altered. Pt weak, lethargic but denies pain. Reports cough, ill recently. States "I just can't move." EMS reports SpO2 on RA approx 80%, placed on NRB at 12Lpm. EMS gave pt 1g calcium en route.

## 2017-04-07 NOTE — Progress Notes (Signed)
HD RN called d/t bipap alarming.  Pt is awake, no distress noted.  BBSH diminished clear.  Pt placed on 4 lpm [place], appears to be tol well, VSS currently.  HR 73-77, sat 100%.  Pt denies SOB currently.  HD RN aware.  ED RT calling floor RT to give RT report.

## 2017-04-07 NOTE — Progress Notes (Signed)
FPTS Interim Progress Note  S: Called to the bedside for persistant apnic state. Currenlty on Bipap. Pt is somnolent, but rousable. Can answer yes/no questions. 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 Pt on Bipap, Order ABG. Values were wnl. Ordered portible CXR to assess for possible volume overload status. Recent BMP showed normal electrolytes. CMP drawn on admission showed normal kidney function. Will hold sedating meds and continue to monitor respirtory status. Not likely infectious at this time as patient is normotensive, regular rate. No overt signs of sepsis.   Bonnita Hollow, MD 04/07/2017, 3:10 PM PGY-1, [facility] Service pager [phone]

## 2017-04-07 NOTE — ED Notes (Signed)
Critical lab value communicated to [facility], MD.

## 2017-04-07 NOTE — ED Notes (Signed)
SpO2 sensor providing inconsistent & unreliable readings. Have attempted multiple site changes, cord changes, and module change, without improvement in readings or waveform. Continuing BiPAP therapy.

## 2017-04-07 NOTE — Consult Note (Signed)
[facility] Renal Consultation Note    Indication for Consultation:  Management of ESRD/hemodialysis; anemia, hypertension/volume and secondary hyperparathyroidism  HPI: [name] is a 61 y.o. male.  [name] has multiple medical problems, most notable for PVD s/p L BKA and right transmet amputation, chronic CHF, CMP (EF 20-25%), COPD (wears chronic O2 at home), OSA, HTN, CAD, and ESRD ([place] MWF) who was brought to [facility] with worsening SOB over the weekend.  His daughter reports that he did not go to HD on Friday due to transportation no coming to get him.  She states that she picked him up from [facility] yesterday because he "couldn't stay with my cousin anymore".  She also stated that he was having trouble breathing ever since she picked him up and was wearing his oxygen all day (usually only at night).  In the ED he was noted to be in moderate respiratory distress and placed on BiPap.  Labs were notable for K of >7.5, BUN 147, Ca 10.5, albumin 2.9, Hgb 12.3, and BNP >4500.  We were consulted to help provide urgent HD for hyperkalemia and volume overload.    Of note, his Temp was 99.5 and CXR revealed cardiomegaly with worsening vascular congestion and bilateral pleural effusions compared to previous studies. Past Medical History:  Diagnosis Date  . 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 Amputee, below knee, left ([place])   . Anemia   . Asthma   . Cardiomyopathy ([place])   . CHF (congestive heart failure) ([place])   . Chronic systolic heart failure ([place])   . Complication of anesthesia    hypotension  . COPD (chronic obstructive pulmonary disease) ([place])   . Coronary artery disease   . Dialysis patient ([place])    Mon, Wed, Fri  . End stage renal disease ([place])   . GERD (gastroesophageal reflux disease)   . Headache   . History of kidney stones   . History of pulmonary embolism   . HLD (hyperlipidemia)   . HTN (hypertension)   . Hyperparathyroidism   .  Myocardial infarction ([place])   . Peripheral vascular disease ([place])   . Shortness of breath dyspnea   . Sleep apnea    NO C-PAP, Patient stated in process of  "getting one"   . Tobacco dependence    Past Surgical History:  Procedure Laterality Date  . AV FISTULA PLACEMENT Left   . CARDIAC CATHETERIZATION     stent placement   . CORONARY ANGIOPLASTY     Family History:   Family History  Problem Relation Age of Onset  . Heart failure Other   . Hypertension Other   . Leukemia Other   . Diabetes Other   . Prostate cancer Neg Hx   . Kidney cancer Neg Hx   . Bladder Cancer Neg Hx    Social History:  reports that he has been smoking cigarettes.  He has been smoking about 0.25 packs per day. he has never used smokeless tobacco. He reports that he does not drink alcohol or use drugs. Allergies  Allergen Reactions  . Dust Mite Extract Other (See Comments)    Reaction: unknown   Prior to Admission medications   Medication Sig Start Date End Date Taking? Authorizing Provider  acetaminophen (TYLENOL) 500 MG tablet Take by mouth.    [provider]  apixaban (ELIQUIS) 5 MG TABS tablet Take 1 tablet (5 mg total) by mouth 2 (two) times daily. 01/20/17  Gladstone Lighter, MD  aspirin EC 325 MG tablet Take by mouth.    [provider]  atorvastatin (LIPITOR) 10 MG tablet Take 10 mg by mouth at bedtime.     [provider]  budesonide-formoterol (SYMBICORT) 160-4.5 MCG/ACT inhaler Inhale 2 puffs into the lungs 2 (two) times daily. Reported on 08/14/2015    [provider]  calcium acetate (PHOSLO) 667 MG capsule Take by mouth.    [provider]  carvedilol (COREG) 3.125 MG tablet Take 1 tablet (3.125 mg total) by mouth 2 (two) times daily with a meal. 01/20/17   Gladstone Lighter, MD  cefUROXime (CEFTIN) 250 MG tablet Take 1 tablet (250 mg total) by mouth daily. X 7 days Patient not taking: Reported on 02/10/2017 01/21/17   Gladstone Lighter, MD   cefUROXime (CEFTIN) 250 MG tablet Take 1 tablet (250 mg total) by mouth every Monday, Wednesday, and Friday with hemodialysis. x7 days Patient not taking: Reported on 03/11/2017 01/22/17   Gladstone Lighter, MD  cetirizine (ZYRTEC) 10 MG tablet Take 10 mg by mouth daily.    [provider]  docusate sodium (COLACE) 100 MG capsule Take by mouth.    [provider]  fluticasone (FLONASE) 50 MCG/ACT nasal spray Place into the nose.    [provider]  guaifenesin (ROBITUSSIN) 100 MG/5ML syrup Take 200 mg by mouth every 6 (six) hours as needed for cough.    [provider]  Ipratropium-Albuterol (COMBIVENT RESPIMAT) 20-100 MCG/ACT AERS respimat Inhale 1 puff into the lungs every 6 (six) hours as needed for wheezing or shortness of breath.    [provider]  isosorbide mononitrate (IMDUR) 30 MG 24 hr tablet Take by mouth. 08/22/16   [provider]  menthol-cetylpyridinium (CEPACOL) 3 MG lozenge Take 1 lozenge (3 mg total) by mouth as needed for sore throat. Patient not taking: Reported on 03/11/2017 01/20/17   Gladstone Lighter, MD  midodrine (PROAMATINE) 10 MG tablet Take 5 mg by mouth 2 (two) times daily.    [provider]  Multiple Vitamin (MULTI-VITAMINS) TABS Take by mouth.    [provider]  Multiple Vitamin (MULTIVITAMIN WITH MINERALS) TABS tablet Take 1 tablet by mouth 2 (two) times daily.    [provider]  nitroGLYCERIN (NITROSTAT) 0.4 MG SL tablet Place 0.4 mg under the tongue every 5 (five) minutes as needed for chest pain.    [provider]  nystatin ointment (MYCOSTATIN) Apply 1 application topically 2 (two) times daily. Patient not taking: Reported on 03/11/2017 02/14/17   Sable Feil, PA-C  nystatin-triamcinolone ointment [facility]) Apply 1 application topically 2 (two) times daily. Patient not taking: Reported on 03/11/2017 01/28/17   Lavonia Drafts, MD  oxycodone (OXY-IR) 5 MG capsule Take  by mouth.    [provider]  oxyCODONE-acetaminophen (PERCOCET) 5-325 MG tablet Take by mouth.    [provider]  pantoprazole (PROTONIX) 20 MG tablet Take 20 mg by mouth daily.    [provider]  polyethylene glycol (MIRALAX / GLYCOLAX) packet Take by mouth.    [provider]  pregabalin (LYRICA) 75 MG capsule Take 1 capsule (75 mg total) by mouth daily. Patient not taking: Reported on 03/11/2017 01/22/17   Gladstone Lighter, MD  sevelamer (RENAGEL) 800 MG tablet Take by mouth.    [provider]  sevelamer carbonate (RENVELA) 800 MG tablet Take 800 mg by mouth 2 (two) times daily.     [provider]  tiotropium (SPIRIVA) 18 MCG inhalation  capsule Place 18 mcg into inhaler and inhale daily.    [provider]  traMADol (ULTRAM) 50 MG tablet Take 1 tablet (50 mg total) by mouth every 6 (six) hours as needed for moderate pain. Patient not taking: Reported on 03/11/2017 02/14/17   Sable Feil, PA-C  warfarin (COUMADIN) 3 MG tablet Take by mouth.    [provider]   No current facility-administered medications for this encounter.    Current Outpatient Medications  Medication Sig Dispense Refill  . acetaminophen (TYLENOL) 500 MG tablet Take by mouth.    [name] apixaban (ELIQUIS) 5 MG TABS tablet Take 1 tablet (5 mg total) by mouth 2 (two) times daily. 60 tablet 2  . aspirin EC 325 MG tablet Take by mouth.    [name] atorvastatin (LIPITOR) 10 MG tablet Take 10 mg by mouth at bedtime.     . budesonide-formoterol (SYMBICORT) 160-4.5 MCG/ACT inhaler Inhale 2 puffs into the lungs 2 (two) times daily. Reported on 08/14/2015    . calcium acetate (PHOSLO) 667 MG capsule Take by mouth.    . carvedilol (COREG) 3.125 MG tablet Take 1 tablet (3.125 mg total) by mouth 2 (two) times daily with a meal. 60 tablet 2  . cefUROXime (CEFTIN) 250 MG tablet Take 1 tablet (250 mg total) by mouth daily. X 7 days (Patient not taking: Reported on  02/10/2017) 7 tablet 0  . cefUROXime (CEFTIN) 250 MG tablet Take 1 tablet (250 mg total) by mouth every Monday, Wednesday, and Friday with hemodialysis. x7 days (Patient not taking: Reported on 03/11/2017) 4 tablet 0  . cetirizine (ZYRTEC) 10 MG tablet Take 10 mg by mouth daily.    [name] docusate sodium (COLACE) 100 MG capsule Take by mouth.    . fluticasone (FLONASE) 50 MCG/ACT nasal spray Place into the nose.    [name] guaifenesin (ROBITUSSIN) 100 MG/5ML syrup Take 200 mg by mouth every 6 (six) hours as needed for cough.    . Ipratropium-Albuterol (COMBIVENT RESPIMAT) 20-100 MCG/ACT AERS respimat Inhale 1 puff into the lungs every 6 (six) hours as needed for wheezing or shortness of breath.    . isosorbide mononitrate (IMDUR) 30 MG 24 hr tablet Take by mouth.    . menthol-cetylpyridinium (CEPACOL) 3 MG lozenge Take 1 lozenge (3 mg total) by mouth as needed for sore throat. (Patient not taking: Reported on 03/11/2017) 100 tablet 12  . midodrine (PROAMATINE) 10 MG tablet Take 5 mg by mouth 2 (two) times daily.    . Multiple Vitamin (MULTI-VITAMINS) TABS Take by mouth.    . Multiple Vitamin (MULTIVITAMIN WITH MINERALS) TABS tablet Take 1 tablet by mouth 2 (two) times daily.    . nitroGLYCERIN (NITROSTAT) 0.4 MG SL tablet Place 0.4 mg under the tongue every 5 (five) minutes as needed for chest pain.    [name] nystatin ointment (MYCOSTATIN) Apply 1 application topically 2 (two) times daily. (Patient not taking: Reported on 03/11/2017) 30 g 0  . nystatin-triamcinolone ointment (MYCOLOG) Apply 1 application topically 2 (two) times daily. (Patient not taking: Reported on 03/11/2017) 30 g 0  . oxycodone (OXY-IR) 5 MG capsule Take by mouth.    . oxyCODONE-acetaminophen (PERCOCET) 5-325 MG tablet Take by mouth.    . pantoprazole (PROTONIX) 20 MG tablet Take 20 mg by mouth daily.    . polyethylene glycol (MIRALAX / GLYCOLAX) packet Take by mouth.    . pregabalin (LYRICA) 75 MG capsule Take 1 capsule (75 mg total) by mouth  daily. (Patient not taking: Reported  on 03/11/2017) 20 capsule 0  . sevelamer (RENAGEL) 800 MG tablet Take by mouth.    . sevelamer carbonate (RENVELA) 800 MG tablet Take 800 mg by mouth 2 (two) times daily.     [name] tiotropium (SPIRIVA) 18 MCG inhalation capsule Place 18 mcg into inhaler and inhale daily.    . traMADol (ULTRAM) 50 MG tablet Take 1 tablet (50 mg total) by mouth every 6 (six) hours as needed for moderate pain. (Patient not taking: Reported on 03/11/2017) 12 tablet 0  . warfarin (COUMADIN) 3 MG tablet Take by mouth.     Labs: Basic Metabolic Panel: Recent Labs  Lab 04/07/17 0216  NA 135  K >7.5*  CL 103  CO2 16*  GLUCOSE 108*  BUN 147*  CREATININE 14.23*  CALCIUM 10.5*   Liver Function Tests: Recent Labs  Lab 04/07/17 0216  AST 27  ALT 22  ALKPHOS 84  BILITOT 0.9  PROT 8.2*  ALBUMIN 2.9*   No results for input(s): LIPASE, AMYLASE in the last 168 hours. No results for input(s): AMMONIA in the last 168 hours. CBC: Recent Labs  Lab 04/07/17 0216  WBC 8.2  NEUTROABS 5.6  HGB 12.3*  HCT 38.5*  MCV 82.8  PLT 156   Cardiac Enzymes: No results for input(s): CKTOTAL, CKMB, CKMBINDEX, TROPONINI in the last 168 hours. CBG: Recent Labs  Lab 04/07/17 0200 04/07/17 0352  GLUCAP 88 138*   Iron Studies: No results for input(s): IRON, TIBC, TRANSFERRIN, FERRITIN in the last 72 hours. Studies/Results: Dg Chest Port 1 View  Result Date: 04/07/2017 CLINICAL DATA:  61 year old male with shortness of breath. EXAM: PORTABLE CHEST 1 VIEW COMPARISON:  Chest radiograph dated 03/05/2017 FINDINGS: There is moderate cardiomegaly. Small bilateral pleural effusions with associated atelectatic changes of the lung bases, right greater left. Overall there has been interval progression of the pleural effusions and atelectatic changes of the lungs compared to prior radiograph. There is no pneumothorax. Left-sided dialysis catheter remains in similar position. Right axillary  vascular stent noted. No acute osseous pathology. IMPRESSION: Cardiomegaly with progression of vascular congestion and pleural effusions compared to the prior radiograph. Electronically Signed   By: Anner Crete M.D.   On: 04/07/2017 02:26    ROS: Pertinent items are noted in HPI. Physical Exam: Vitals:   04/07/17 0330 04/07/17 0345 04/07/17 0400 04/07/17 0409  BP: (!) 153/71 129/80 (!) 119/95   Pulse: 79     Resp: 20 20 17    Temp:      TempSrc:      SpO2: 100%   100%      Weight change:  No intake or output data in the 24 hours ending 04/07/17 0445 BP (!) 119/95 (BP Location: Right Leg)   Pulse 79   Temp 99.5 F (37.5 C) (Rectal)   Resp 17   SpO2 100%  General appearance: moderate distress and wearing BiPap Head: Normocephalic, without obvious abnormality, atraumatic Resp: diminished breath sounds bibasilar and rales bilaterally Chest wall: Left IJ with cuff exposed Cardio: no rub GI: soft, non-tender; bowel sounds normal; no masses,  no organomegaly Extremities: edema 1+ on right lower extremity, s/p L BKA, LUE AVF +T/B, small caliber Dialysis Access:  Dialysis Orders: Center: [address]  on [address] . EDW 82.5HD Bath 1K  Time 3.5 hrs Heparin 2000 bolus 600/hr. Access LIJ TDC and LUE AVF (placed 9/18) BFR 400 DFR 800      Assessment/Plan: 1.  Hyperkalemia- due to missed HD session.  Will  plan for urgent HD today 2.  ESRD -  As above, continue with MWF but may need extra session if volume remains an issue 3.  Hypertension/volume  - as above 4.  Anemia  -  No ESA with Hgb >12 5.  Metabolic bone disease -   Obtain ca/phos levels and contact home unit for vitamin D dose  6.  Protein Malnutrition - renal diet 7. Vascular access- Left IJ with cuff exposed, LUE AVF placed 11/28/16.  May be able to use but will need to use HD cath to help with hyperkalemia today.  Consider using AVF vs. Placing new HD catheter 8. Fever - with cuff exposed.  Will order blood  cultures with HD and empirically treat with IV vanco and fortaz for concern of catheter- related infection.  Donetta Potts, MD [place] Pager [phone] 04/07/2017, 4:45 AM

## 2017-04-07 NOTE — ED Notes (Signed)
Monitor in Pt's RM not picking up Pulse Ox correctly. When hooked Pt on Dinamap Pulse Ox read 100% with a good wave form. Family Medicine Dr also witnessed.

## 2017-04-07 NOTE — Progress Notes (Signed)
Floor RT called me to check on pt on bipap.  Noted pt having apnea spells while on bipap, and a irregular breathing pattern.  VSS, sat 98-100% on bipap.  Pt does wake up and answers questions, however pt is sluggish in his response and family members were concerned.   Family states pt is more sleepy than usual s/p dialysis.  Family medicine was called and ABG performed (pt did not move/flinch or respond to pain during ABG).  Family medicine at bedside and reported ABG results to them.  No new RT orders currently, per MD continue on bipap for now.  Sat 100%.  VT varies, but currently vt 550 ml, minute volume 7.3 lpm currently.  Unit RT aware.

## 2017-04-07 NOTE — Procedures (Signed)
I was present at this dialysis session. I have reviewed the session itself and made appropriate changes.   Pt on HD, 4L UF goal, to rec IV ABX with HD.  BP cuff over LUE AVF, moved to RLE.  DId 0K for 29min now 1K; check iStat K now.  On BiPAP 40% FIO2.    TDC L chest wall with 47mm exposed cuff; T max 99.67F;  WBC 8.2; AVF appears immature; likely will need TDC removal and new placement  BCx pending.  Tentative HD tomorrow for UF or Wed on schedule, will follow closely  Hb ok.  BP Ok.   Filed Weights   04/07/17 0700  Weight: 76.2 kg (168 lb)    Recent Labs  Lab 04/07/17 0216  NA 135  K >7.5*  CL 103  CO2 16*  GLUCOSE 108*  BUN 147*  CREATININE 14.23*  CALCIUM 10.5*    Recent Labs  Lab 04/07/17 0216  WBC 8.2  NEUTROABS 5.6  HGB 12.3*  HCT 38.5*  MCV 82.8  PLT 156    Scheduled Meds: Continuous Infusions: . sodium chloride    . sodium chloride    . cefTAZidime (FORTAZ)  IV    . cefTAZidime (FORTAZ)  IV Stopped (04/07/17 0610)  . vancomycin Stopped (04/07/17 0610)   PRN Meds:.sodium chloride, sodium chloride, alteplase, heparin, heparin, lidocaine (PF), lidocaine-prilocaine, pentafluoroprop-tetrafluoroeth   Pearson Grippe  MD 04/07/2017, 8:18 AM

## 2017-04-07 NOTE — Progress Notes (Signed)
Pt had sustained periods of apnea, placed on NIV.  Will remove when more awake

## 2017-04-07 NOTE — ED Notes (Signed)
Abx received from Pharmacy, and transported to Hemodialysis with pt for later administration.

## 2017-04-07 NOTE — ED Provider Notes (Signed)
[facility] EMERGENCY DEPARTMENT Provider Note   CSN: [id number] Arrival date & time: 04/07/17  0151     History   Chief Complaint Chief Complaint  Patient presents with  . Altered Mental Status    HPI [name] is a 61 y.o. male.  Patient is a 61 year old male with past medical history of hypertension, COPD, and end-stage renal disease for which he is on hemodialysis.  He presents today for evaluation of difficulty breathing, weakness, and not feeling well.  This began earlier this evening.  He reports missing dialysis on Friday.  He denies any fevers, chills, or productive cough.  His EKG was abnormal by EMS and was administered calcium in route.   The history is provided by the patient.  Altered Mental Status   This is a new problem. The current episode started yesterday. The problem has been rapidly worsening. Associated symptoms include confusion.    Past Medical History:  Diagnosis Date  . 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 Amputee, below knee, left ([place])   . Anemia   . Asthma   . Cardiomyopathy ([place])   . CHF (congestive heart failure) ([place])   . Chronic systolic heart failure ([place])   . Complication of anesthesia    hypotension  . COPD (chronic obstructive pulmonary disease) ([place])   . Coronary artery disease   . Dialysis patient ([place])    Mon, Wed, Fri  . End stage renal disease ([place])   . GERD (gastroesophageal reflux disease)   . Headache   . History of kidney stones   . History of pulmonary embolism   . HLD (hyperlipidemia)   . HTN (hypertension)   . Hyperparathyroidism   . Myocardial infarction ([place])   . Peripheral vascular disease ([place])   . Shortness of breath dyspnea   . Sleep apnea    NO C-PAP, Patient stated in process of  "getting one"   . Tobacco dependence     Patient Active Problem List   Diagnosis Date Noted  . Acute on chronic systolic CHF (congestive heart failure) ([place]) 01/18/2017  . Respiratory  failure with hypoxia ([place]) 11/11/2016  . Acute respiratory failure ([place]) 10/28/2016  . Pulmonary embolism ([place]) 08/03/2016  . NSTEMI (non-ST elevated myocardial infarction) ([place]) 07/26/2016  . Acute respiratory failure with hypoxia ([place]) 05/24/2016  . Right lower lobe pneumonia ([place]) 05/24/2016  . Elevated troponin 05/24/2016  . Pulmonary edema 05/21/2016  . Kidney dialysis as the cause of abnormal reaction of the patient, or of later complication, without mention of misadventure at the time of the procedure (CODE) 03/05/2016  . PVD (peripheral vascular disease) ([place]) 03/05/2016  . Preop examination 05/02/2015  . Chronic systolic heart failure ([place])   . Sepsis ([place]) 01/13/2015  . Acute encephalopathy 01/13/2015  . Hyperkalemia 01/13/2015  . Gangrene of foot ([place]) 05/04/2014  . ESRD (end stage renal disease) on dialysis ([place]) 05/04/2014  . CAD (coronary artery disease) 05/04/2014  . Normocytic anemia 05/04/2014  . Hyperlipidemia 04/26/2010  . HYPERTENSION, BENIGN 04/26/2010  . CAD, NATIVE VESSEL 04/26/2010    Past Surgical History:  Procedure Laterality Date  . AV FISTULA PLACEMENT Left   . CARDIAC CATHETERIZATION     stent placement   . CORONARY ANGIOPLASTY         Home Medications    Prior to Admission medications   Medication Sig Start Date End Date Taking? Authorizing Provider  acetaminophen (TYLENOL) 500 MG tablet Take by mouth.  [provider]  apixaban (ELIQUIS) 5 MG TABS tablet Take 1 tablet (5 mg total) by mouth 2 (two) times daily. 01/20/17   Gladstone Lighter, MD  aspirin EC 325 MG tablet Take by mouth.    [provider]  atorvastatin (LIPITOR) 10 MG tablet Take 10 mg by mouth at bedtime.     [provider]  budesonide-formoterol (SYMBICORT) 160-4.5 MCG/ACT inhaler Inhale 2 puffs into the lungs 2 (two) times daily. Reported on 08/14/2015    [provider]  calcium acetate (PHOSLO) 667 MG capsule Take by mouth.    [provider]  carvedilol (COREG) 3.125 MG tablet Take 1 tablet (3.125 mg total) by mouth 2 (two) times daily with a meal. 01/20/17   Gladstone Lighter, MD  cefUROXime (CEFTIN) 250 MG tablet Take 1 tablet (250 mg total) by mouth daily. X 7 days Patient not taking: Reported on 02/10/2017 01/21/17   Gladstone Lighter, MD  cefUROXime (CEFTIN) 250 MG tablet Take 1 tablet (250 mg total) by mouth every Monday, Wednesday, and Friday with hemodialysis. x7 days Patient not taking: Reported on 03/11/2017 01/22/17   Gladstone Lighter, MD  cetirizine (ZYRTEC) 10 MG tablet Take 10 mg by mouth daily.    [provider]  docusate sodium (COLACE) 100 MG capsule Take by mouth.    [provider]  fluticasone (FLONASE) 50 MCG/ACT nasal spray Place into the nose.    [provider]  guaifenesin (ROBITUSSIN) 100 MG/5ML syrup Take 200 mg by mouth every 6 (six) hours as needed for cough.    [provider]  Ipratropium-Albuterol (COMBIVENT RESPIMAT) 20-100 MCG/ACT AERS respimat Inhale 1 puff into the lungs every 6 (six) hours as needed for wheezing or shortness of breath.    [provider]  isosorbide mononitrate (IMDUR) 30 MG 24 hr tablet Take by mouth. 08/22/16   [provider]  menthol-cetylpyridinium (CEPACOL) 3 MG lozenge Take 1 lozenge (3 mg total) by mouth as needed for sore throat. Patient not taking: Reported on 03/11/2017 01/20/17   Gladstone Lighter, MD  midodrine (PROAMATINE) 10 MG tablet Take 5 mg by mouth 2 (two) times daily.    [provider]  Multiple Vitamin (MULTI-VITAMINS) TABS Take by mouth.    [provider]  Multiple Vitamin (MULTIVITAMIN WITH MINERALS) TABS tablet Take 1 tablet by mouth 2 (two) times daily.    [provider]  nitroGLYCERIN (NITROSTAT) 0.4 MG SL tablet Place 0.4 mg under the tongue every 5 (five) minutes as needed for chest pain.    [provider]  nystatin ointment (MYCOSTATIN) Apply 1  application topically 2 (two) times daily. Patient not taking: Reported on 03/11/2017 02/14/17   Sable Feil, PA-C  nystatin-triamcinolone ointment [facility]) Apply 1 application topically 2 (two) times daily. Patient not taking: Reported on 03/11/2017 01/28/17   Lavonia Drafts, MD  oxycodone (OXY-IR) 5 MG capsule Take by mouth.    [provider]  oxyCODONE-acetaminophen (PERCOCET) 5-325 MG tablet Take by mouth.    [provider]  pantoprazole (PROTONIX) 20 MG tablet Take 20 mg by mouth daily.    [provider]  polyethylene glycol (MIRALAX / GLYCOLAX) packet Take by mouth.    [provider]  pregabalin (LYRICA) 75 MG capsule Take 1 capsule (75 mg total) by mouth daily. Patient not taking: Reported on 03/11/2017 01/22/17   Gladstone Lighter, MD  sevelamer (RENAGEL) 800 MG tablet Take by mouth.    [provider]  sevelamer carbonate (RENVELA)  800 MG tablet Take 800 mg by mouth 2 (two) times daily.     [provider]  tiotropium (SPIRIVA) 18 MCG inhalation capsule Place 18 mcg into inhaler and inhale daily.    [provider]  traMADol (ULTRAM) 50 MG tablet Take 1 tablet (50 mg total) by mouth every 6 (six) hours as needed for moderate pain. Patient not taking: Reported on 03/11/2017 02/14/17   Sable Feil, PA-C  warfarin (COUMADIN) 3 MG tablet Take by mouth.    [provider]    Family History Family History  Problem Relation Age of Onset  . Heart failure Other   . Hypertension Other   . Leukemia Other   . Diabetes Other   . Prostate cancer Neg Hx   . Kidney cancer Neg Hx   . Bladder Cancer Neg Hx     Social History Social History   Tobacco Use  . Smoking status: Light Tobacco Smoker    Packs/day: 0.25    Types: Cigarettes  . Smokeless tobacco: Never Used  . Tobacco comment: 5  Substance Use Topics  . Alcohol use: No    Alcohol/week: 0.0 oz  . Drug use: No    Comment: has used crack cocaine in  past      Allergies   Dust mite extract   Review of Systems Review of Systems  Psychiatric/Behavioral: Positive for confusion.  All other systems reviewed and are negative.    Physical Exam Updated Vital Signs BP (!) 145/98   Pulse 88   Temp 99.5 F (37.5 C) (Rectal)   Resp (!) 21   SpO2 95%   Physical Exam  Constitutional: He is oriented to person, place, and time. No distress.  Patient is an acutely on chronically ill-appearing male.  He appears in moderate distress.  HENT:  Head: Normocephalic and atraumatic.  Mouth/Throat: Oropharynx is clear and moist.  Neck: Normal range of motion. Neck supple.  Cardiovascular: Normal rate and regular rhythm. Exam reveals no friction rub.  No murmur heard. Pulmonary/Chest: He is in respiratory distress. He has no wheezes. He has rales.  There are rales in the bases bilaterally.  Abdominal: Soft. Bowel sounds are normal. He exhibits no distension. There is no tenderness.  Musculoskeletal: Normal range of motion. He exhibits no edema.  Neurological: He is alert and oriented to person, place, and time. Coordination normal.  Skin: Skin is warm and dry. He is not diaphoretic.  Nursing note and vitals reviewed.    ED Treatments / Results  Labs (all labs ordered are listed, but only abnormal results are displayed) Labs Reviewed  CBC WITH DIFFERENTIAL/PLATELET - Abnormal; Notable for the following components:      Result Value   Hemoglobin 12.3 (*)    HCT 38.5 (*)    RDW 19.4 (*)    All other components within normal limits  COMPREHENSIVE METABOLIC PANEL - Abnormal; Notable for the following components:   Potassium >7.5 (*)    CO2 16 (*)    Glucose, Bld 108 (*)    BUN 147 (*)    Creatinine, Ser 14.23 (*)    Calcium 10.5 (*)    Total Protein 8.2 (*)    Albumin 2.9 (*)    GFR calc non Af Amer 3 (*)    GFR calc Af Amer 4 (*)    Anion gap 16 (*)    All other components within normal limits  BRAIN NATRIURETIC PEPTIDE -  Abnormal; Notable for the following components:  B Natriuretic Peptide >4,500.0 (*)    All other components within normal limits  I-STAT TROPONIN, ED - Abnormal; Notable for the following components:   Troponin i, poc 0.10 (*)    All other components within normal limits  CBG MONITORING, ED  I-STAT CHEM 8, ED    EKG  EKG Interpretation None       Radiology Dg Chest Port 1 View  Result Date: 04/07/2017 CLINICAL DATA:  62 year old male with shortness of breath. EXAM: PORTABLE CHEST 1 VIEW COMPARISON:  Chest radiograph dated 03/05/2017 FINDINGS: There is moderate cardiomegaly. Small bilateral pleural effusions with associated atelectatic changes of the lung bases, right greater left. Overall there has been interval progression of the pleural effusions and atelectatic changes of the lungs compared to prior radiograph. There is no pneumothorax. Left-sided dialysis catheter remains in similar position. Right axillary vascular stent noted. No acute osseous pathology. IMPRESSION: Cardiomegaly with progression of vascular congestion and pleural effusions compared to the prior radiograph. Electronically Signed   By: Anner Crete M.D.   On: 04/07/2017 02:26    Procedures Procedures (including critical care time)  Medications Ordered in ED Medications  dextrose 50 % solution 50 mL (50 mLs Intravenous Given 04/07/17 0228)  insulin aspart (novoLOG) injection 10 Units (10 Units Intravenous Given 04/07/17 0234)     Initial Impression / Assessment and Plan / ED Course  I have reviewed the triage vital signs and the nursing notes.  Pertinent labs & imaging results that were available during my care of the patient were reviewed by me and considered in my medical decision making (see chart for details).  Patient with history of end-stage renal disease on hemodialysis presenting for evaluation of weakness and difficulty breathing.  His prehospital EKG reveals widened QRS with peaked T waves.   He was on CPAP by EMS, then placed on BiPAP once in the ED.  Due to the EKG changes, he was given insulin and glucose, albuterol nebs, and bicarb.  His potassium did return greater than 7.5 with no hemolysis.  Chest x-ray shows evidence for volume overload and bicarb is 16.  All of these indicate this patient requires emergent dialysis.  I have discussed this with Dr. Verneda Skill who requests the patient be admitted and will make arrangements for urgent dialysis.  CRITICAL CARE Performed by: Veryl Speak Total critical care time: 45 minutes Critical care time was exclusive of separately billable procedures and treating other patients. Critical care was necessary to treat or prevent imminent or life-threatening deterioration. Critical care was time spent personally by me on the following activities: development of treatment plan with patient and/or surrogate as well as nursing, discussions with consultants, evaluation of patient's response to treatment, examination of patient, obtaining history from patient or surrogate, ordering and performing treatments and interventions, ordering and review of laboratory studies, ordering and review of radiographic studies, pulse oximetry and re-evaluation of patient's condition.   Final Clinical Impressions(s) / ED Diagnoses   Final diagnoses:  None    ED Discharge Orders    None       Veryl Speak, MD 04/07/17 [phone]

## 2017-04-07 NOTE — H&P (Signed)
[facility] Admission History and Physical Service Pager: [phone]  Patient name: [name] Medical record number: [id number] Date of birth: 1955-12-24 Age: 61 y.o. Gender: male  Primary Care Provider: Donnie Coffin, MD Consultants: nephrology Code Status: Full code  Chief Complaint: shortness of breath  Assessment and Plan: [name] is a 61 y.o. male with ESRD (MWF) with increased shortness of breath and weakness x2 days found to be hyperkalemic to greater than 7.5 after missing dialysis on Friday. PMH is significant for PVD s/p L BKA and right transmetatarsal amputation, ESRD (MWD), HFrEF (EF 20-25%), COPD (3L O2 at home), HTN, CAD  Respiratory distress and weakness 2/2 missed HD History of ESRD on MWF schedule missed dialysis 3 days ago and has had progressively worsening shortness of breath and weakness over the last 2 days. Patient wears 3 L O2 at home for COPD and chronic systolic heart failure.  Started on CPAP in route to ED, currently on BiPAP satting appropriately. Unable to assess grip strength 2/2 weakness and RLE 2/5. Will receive urgent dialysis for hyperkalemia and bicab of 16.  -admit to stepdown under attending [place] -continue bipap and deescalate as appropriate -continuous pulse ox -nephrology following; urgent dialysis  -continue COPD medication as below -q4 neuro checks -continuous cardiac monitoring  Hyperkalemia K >7.5 without hemolysis and peaked t-waves and QRS prolongation. Received calcium gluconate, D50 and insulin in ED. Nephrology consulted and patient will receive emergent dialysis. Currently denying any CP and HR WNL on monitor.  -Emergent dialysis -EKG after dialysis -Follow BMP this afternoon  ESRD (MWF) Missed HD 04/04/17. Reports that he usually does not miss any sessions. Hyperkalemic >7.5, Ca 10.5 and bicarb to 16. Currently receiving HD.  -nephrology following -f/u renal studies  Hypertension with  volume overload Normotensive upon presentation. Reportedly overloaded per nephrology.  -continue coreg  COPD On 3L [place] at home. No report of increased sputum production, fevers or chills. Increased WOB 2/2 missed HD. Currently on bipap and satting appropriately. -continue bipap and deescalate as appropriate -cont albuterol PRN, duonebs  Exposed catheter cuff, concern for infection Temperature to 99.5 and has exposed cuff. Nephrology ordered blood cultures and empirically started on vanc and ceftazidime.  -f/u blood cultures -replace catheter; IR order -cont vanc/ceftazidine -trend temperatures  Hyperlipidemia Cont statin   FEN/GI: NPO while on bipap Prophylaxis: eliquis  Disposition: admit to SDU for emergent dialysis for hyperkalemia with EKG changes   History of Present Illness:  [name] is a 61 y.o. male presenting with a history of ESRD and has dialysis on MWF schedule. Unfortunately he missed his Friday session because transportation did not show up. Started complaining of weakness yesterday.  Patient was living with a niece up until 3 days ago and then moved in with his daughter 2 days ago.  Per the daughter he has been complaining of worsening shortness of breath and weakness and they called 911.  He denied any chest pain, confusion, but did endorse some nausea and weakness.    In route to the emergency department he had an EKG showing peaked T waves, widened QRS and was given calcium gluconate and started on CPAP and then BiPAP once he arrived in the ED.  In the ED he was given insulin, D50, albuterol nebs and bicarb.  Potassium resulted greater than 7.5 with no hemolysis.  Bicarb was 16.  Nephrologist Dr. Verneda Skill was consulted who will be arranging for patient to receive for urgent dialysis.  Review Of Systems: Per HPI   ROS  Patient Active Problem List   Diagnosis Date Noted  . Acute on chronic systolic CHF (congestive heart failure) ([place]) 01/18/2017  .  Respiratory failure with hypoxia ([place]) 11/11/2016  . Acute respiratory failure ([place]) 10/28/2016  . Pulmonary embolism ([place]) 08/03/2016  . NSTEMI (non-ST elevated myocardial infarction) ([place]) 07/26/2016  . Acute respiratory failure with hypoxia ([place]) 05/24/2016  . Right lower lobe pneumonia ([place]) 05/24/2016  . Elevated troponin 05/24/2016  . Pulmonary edema 05/21/2016  . Kidney dialysis as the cause of abnormal reaction of the patient, or of later complication, without mention of misadventure at the time of the procedure (CODE) 03/05/2016  . PVD (peripheral vascular disease) ([place]) 03/05/2016  . Preop examination 05/02/2015  . Chronic systolic heart failure ([place])   . Sepsis ([place]) 01/13/2015  . Acute encephalopathy 01/13/2015  . Hyperkalemia 01/13/2015  . Gangrene of foot ([place]) 05/04/2014  . ESRD (end stage renal disease) on dialysis ([place]) 05/04/2014  . CAD (coronary artery disease) 05/04/2014  . Normocytic anemia 05/04/2014  . Hyperlipidemia 04/26/2010  . HYPERTENSION, BENIGN 04/26/2010  . CAD, NATIVE VESSEL 04/26/2010    Past Medical History: Past Medical History:  Diagnosis Date  . 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 Amputee, below knee, left ([place])   . Anemia   . Asthma   . Cardiomyopathy ([place])   . CHF (congestive heart failure) ([place])   . Chronic systolic heart failure ([place])   . Complication of anesthesia    hypotension  . COPD (chronic obstructive pulmonary disease) ([place])   . Coronary artery disease   . Dialysis patient ([place])    Mon, Wed, Fri  . End stage renal disease ([place])   . GERD (gastroesophageal reflux disease)   . Headache   . History of kidney stones   . History of pulmonary embolism   . HLD (hyperlipidemia)   . HTN (hypertension)   . Hyperparathyroidism   . Myocardial infarction ([place])   . Peripheral vascular disease ([place])   . Shortness of breath dyspnea   . Sleep apnea    NO C-PAP, Patient stated in process of  "getting one"   . Tobacco  dependence     Past Surgical History: Past Surgical History:  Procedure Laterality Date  . AV FISTULA PLACEMENT Left   . CARDIAC CATHETERIZATION     stent placement   . CORONARY ANGIOPLASTY      Social History: Social History   Tobacco Use  . Smoking status: Light Tobacco Smoker    Packs/day: 0.25    Types: Cigarettes  . Smokeless tobacco: Never Used  . Tobacco comment: 5  Substance Use Topics  . Alcohol use: No    Alcohol/week: 0.0 oz  . Drug use: No    Comment: has used crack cocaine in past     Family History: Family History  Problem Relation Age of Onset  . Heart failure Other   . Hypertension Other   . Leukemia Other   . Diabetes Other   . Prostate cancer Neg Hx   . Kidney cancer Neg Hx   . Bladder Cancer Neg Hx     Allergies and Medications: Allergies  Allergen Reactions  . Dust Mite Extract Other (See Comments)    Reaction: unknown   No current facility-administered medications on file prior to encounter.    Current Outpatient Medications on File Prior to Encounter  Medication Sig Dispense Refill  . acetaminophen (TYLENOL) 500  MG tablet Take by mouth.    [name] apixaban (ELIQUIS) 5 MG TABS tablet Take 1 tablet (5 mg total) by mouth 2 (two) times daily. 60 tablet 2  . aspirin EC 325 MG tablet Take by mouth.    [name] atorvastatin (LIPITOR) 10 MG tablet Take 10 mg by mouth at bedtime.     . budesonide-formoterol (SYMBICORT) 160-4.5 MCG/ACT inhaler Inhale 2 puffs into the lungs 2 (two) times daily. Reported on 08/14/2015    . calcium acetate (PHOSLO) 667 MG capsule Take by mouth.    . carvedilol (COREG) 3.125 MG tablet Take 1 tablet (3.125 mg total) by mouth 2 (two) times daily with a meal. 60 tablet 2  . cefUROXime (CEFTIN) 250 MG tablet Take 1 tablet (250 mg total) by mouth daily. X 7 days (Patient not taking: Reported on 02/10/2017) 7 tablet 0  . cefUROXime (CEFTIN) 250 MG tablet Take 1 tablet (250 mg total) by mouth every Monday, Wednesday, and Friday with  hemodialysis. x7 days (Patient not taking: Reported on 03/11/2017) 4 tablet 0  . cetirizine (ZYRTEC) 10 MG tablet Take 10 mg by mouth daily.    [name] docusate sodium (COLACE) 100 MG capsule Take by mouth.    . fluticasone (FLONASE) 50 MCG/ACT nasal spray Place into the nose.    [name] guaifenesin (ROBITUSSIN) 100 MG/5ML syrup Take 200 mg by mouth every 6 (six) hours as needed for cough.    . Ipratropium-Albuterol (COMBIVENT RESPIMAT) 20-100 MCG/ACT AERS respimat Inhale 1 puff into the lungs every 6 (six) hours as needed for wheezing or shortness of breath.    . isosorbide mononitrate (IMDUR) 30 MG 24 hr tablet Take by mouth.    . menthol-cetylpyridinium (CEPACOL) 3 MG lozenge Take 1 lozenge (3 mg total) by mouth as needed for sore throat. (Patient not taking: Reported on 03/11/2017) 100 tablet 12  . midodrine (PROAMATINE) 10 MG tablet Take 5 mg by mouth 2 (two) times daily.    . Multiple Vitamin (MULTI-VITAMINS) TABS Take by mouth.    . Multiple Vitamin (MULTIVITAMIN WITH MINERALS) TABS tablet Take 1 tablet by mouth 2 (two) times daily.    . nitroGLYCERIN (NITROSTAT) 0.4 MG SL tablet Place 0.4 mg under the tongue every 5 (five) minutes as needed for chest pain.    [name] nystatin ointment (MYCOSTATIN) Apply 1 application topically 2 (two) times daily. (Patient not taking: Reported on 03/11/2017) 30 g 0  . nystatin-triamcinolone ointment (MYCOLOG) Apply 1 application topically 2 (two) times daily. (Patient not taking: Reported on 03/11/2017) 30 g 0  . oxycodone (OXY-IR) 5 MG capsule Take by mouth.    . oxyCODONE-acetaminophen (PERCOCET) 5-325 MG tablet Take by mouth.    . pantoprazole (PROTONIX) 20 MG tablet Take 20 mg by mouth daily.    . polyethylene glycol (MIRALAX / GLYCOLAX) packet Take by mouth.    . pregabalin (LYRICA) 75 MG capsule Take 1 capsule (75 mg total) by mouth daily. (Patient not taking: Reported on 03/11/2017) 20 capsule 0  . sevelamer (RENAGEL) 800 MG tablet Take by mouth.    . sevelamer  carbonate (RENVELA) 800 MG tablet Take 800 mg by mouth 2 (two) times daily.     [name] tiotropium (SPIRIVA) 18 MCG inhalation capsule Place 18 mcg into inhaler and inhale daily.    . traMADol (ULTRAM) 50 MG tablet Take 1 tablet (50 mg total) by mouth every 6 (six) hours as needed for moderate pain. (Patient not taking: Reported on 03/11/2017) 12 tablet 0  . warfarin (  COUMADIN) 3 MG tablet Take by mouth.     Objective: BP (!) 135/98   Pulse 91   Temp 99.5 F (37.5 C) (Rectal)   Resp (!) 21   SpO2 95%  Exam: General: 61 year old male lying in bed BiPAP in place appearing uncomfortable but in no acute distress Eyes: Extraocular muscles intact, pupils equal round and reactive to light bilaterally ENTM: No nasal drainage, oropharynx clear and moist (per ED note) Neck: Supple, no lymphadenopathy Cardiovascular: Regular rate and rhythm, no apparent murmurs, no edema Respiratory: BiPAP in place, appearing uncomfortable but in no acute distress, diminished breath sounds bilaterally with rales heard at the bases bilaterally Chest wall: Left IJ with cuff exposed Gastrointestinal: Soft, nontender, nondistended no apparent organomegaly MSK: Left BKA without edema, right lower extremity without edema.  Derm: Warm and dry Neuro: Alert and oriented x3.  Limited cranial nerve exam within normal limits.  Unable to assess for grip strength due to weakness, right lower extremity 2 out of 5 strength.   Labs and Imaging: CBC BMET  Recent Labs  Lab 04/07/17 0216  WBC 8.2  HGB 12.3*  HCT 38.5*  PLT 156   Recent Labs  Lab 04/07/17 0216  NA 135  K >7.5*  CL 103  CO2 16*  BUN 147*  CREATININE 14.23*  GLUCOSE 108*  CALCIUM 10.5*     Dg Chest Port 1 View  Result Date: 04/07/2017 CLINICAL DATA:  61 year old male with shortness of breath. EXAM: PORTABLE CHEST 1 VIEW COMPARISON:  Chest radiograph dated 03/05/2017 FINDINGS: There is moderate cardiomegaly. Small bilateral pleural effusions with  associated atelectatic changes of the lung bases, right greater left. Overall there has been interval progression of the pleural effusions and atelectatic changes of the lungs compared to prior radiograph. There is no pneumothorax. Left-sided dialysis catheter remains in similar position. Right axillary vascular stent noted. No acute osseous pathology. IMPRESSION: Cardiomegaly with progression of vascular congestion and pleural effusions compared to the prior radiograph. Electronically Signed   By: Anner Crete M.D.   On: 04/07/2017 02:26    Eloise Levels, MD 04/07/2017, 5:05 AM PGY-2, [place] Intern pager: [phone], text pages welcome

## 2017-04-08 DIAGNOSIS — N186 End stage renal disease: Secondary | ICD-10-CM

## 2017-04-08 DIAGNOSIS — E875 Hyperkalemia: Principal | ICD-10-CM

## 2017-04-08 DIAGNOSIS — R0681 Apnea, not elsewhere classified: Secondary | ICD-10-CM

## 2017-04-08 DIAGNOSIS — Z992 Dependence on renal dialysis: Secondary | ICD-10-CM

## 2017-04-08 LAB — BASIC METABOLIC PANEL
Anion gap: 15 (ref 5–15)
BUN: 94 mg/dL — AB (ref 6–20)
CALCIUM: 8.8 mg/dL — AB (ref 8.9–10.3)
CO2: 23 mmol/L (ref 22–32)
CREATININE: 11.18 mg/dL — AB (ref 0.61–1.24)
Chloride: 98 mmol/L — ABNORMAL LOW (ref 101–111)
GFR calc Af Amer: 5 mL/min — ABNORMAL LOW (ref >60)
GFR calc non Af Amer: 4 mL/min — ABNORMAL LOW (ref >60)
GLUCOSE: 70 mg/dL (ref 65–99)
Potassium: 6.2 mmol/L — ABNORMAL HIGH (ref 3.5–5.1)
Sodium: 136 mmol/L (ref 135–145)

## 2017-04-08 LAB — GLUCOSE, CAPILLARY
GLUCOSE-CAPILLARY: 100 mg/dL — AB (ref 65–99)
Glucose-Capillary: 80 mg/dL (ref 65–99)
Glucose-Capillary: 137 mg/dL — ABNORMAL HIGH (ref 65–99)

## 2017-04-08 LAB — HEPATITIS B SURFACE ANTIGEN: Hepatitis B Surface Ag: NEGATIVE

## 2017-04-08 MED ORDER — DEXTROSE 5 % IV SOLN
2.0000 g | INTRAVENOUS | Status: DC
  Administered 2017-04-08: 2 g via INTRAVENOUS
  Filled 2017-04-08 (×3): qty 2

## 2017-04-08 MED ORDER — HEPARIN SODIUM (PORCINE) 1000 UNIT/ML DIALYSIS: 100.0000 [IU]/kg | INTRAMUSCULAR | Status: DC | PRN

## 2017-04-08 NOTE — Discharge Instructions (Signed)

## 2017-04-08 NOTE — Progress Notes (Signed)
Family Medicine Teaching Service Daily Progress Note Intern Pager: [phone]  Patient name: [name] Medical record number: [id number] Date of birth: [birth year] Age: 61 y.o. Gender: male  Primary Care Provider: Donnie Coffin, MD Consultants: Nephrology Code Status: Full   Pt Overview and Major Events to Date:  [name] is a 61 y.o. male with ESRD (MWF) with increased shortness of breath and weakness x2 days found to be hyperkalemic to greater than 7.5 after missing dialysis on Friday. PMH is significant for PVD s/p L BKA and right transmetatarsal amputation, ESRD (MWD), HFrEF (EF 20-25%), COPD (3L O2 at home), HTN, CAD  11/12 - Emergent dialysis 11/13 Repeat dialysis  Assessment and Plan:  Respiratory distress and weakness 2/2 missed HD Improved. S/p 1 HD session. Planned for another session today. Pt was apnic yesterday afternoon, requiring BiPap. ABG wnl. CBG was low to 50s. CXR did not show volume overload. Breathing improved this AM on 3L [place]. Satting 97+%.  - likely need CPAP at night -continuous pulse ox -nephrology following;  -continue COPD medication as below -q4 neuro checks -continuous cardiac monitoring  Hyperkalemia S/p Emergent dialysis on 11/12. Hyperkalemic this AM at 6.2. Planned for another dialysis session today. Will repeat EKG  -EKG after dialysis -Follow BMP this PM   ESRD (MWF) Missed HD 04/04/17. Reports that he usually does not miss any sessions. Hyperkalemic >7.5, Ca 10.5 and bicarb to 16. Currently receiving HD.  -nephrology following -f/u renal studies  Hypertension with volume overload Normotensive upon presentation. Reportedly overloaded per nephrology.  -continue coreg  COPD On 3L [place] at home. No report of increased sputum production, fevers or chills. Increased WOB 2/2 missed HD. Currently on bipap and satting appropriately. -cont albuterol PRN, duonebs  Exposed catheter cuff, concern for infection Temperature to 99.5 and  has exposed cuff. Nephrology ordered blood cultures and empirically started on vanc and ceftazidime.  -f/u blood cultures -replace catheter once K is stable -cont vanc/ceftazidine -trend temperatures  Hyperlipidemia Cont statin  FEN/GI: NPO while on bipap Prophylaxis: eliquis  Disposition: pending improvement in volume, electrolyte status  Subjective:  Pt feels breathing is better. No complaints of CP, SOB  Objective: Temp:  [97.5 F (36.4 C)-98.7 F (37.1 C)] 98.2 F (36.8 C) (11/13 0446) Pulse Rate:  [38-78] 76 (11/13 0500) Resp:  [11-22] 18 (11/13 0446) BP: (91-140)/(56-79) 116/65 (11/13 0446) SpO2:  [92 %-100 %] 97 % (11/13 0500) FiO2 (%):  [40 %] 40 % (11/12 1314) Weight:  [159 lb 13.3 oz (72.5 kg)-162 lb 7.7 oz (73.7 kg)] 162 lb 7.7 oz (73.7 kg) (11/13 0446) Physical Exam: General: NAD, eating breakfast in bed Cardiovascular: rrr, no mrg Respiratory: CTAB, on 3 L [place]  Abdomen: distended, tympanic, nontender Extremities: right transtarsal amputation, left bka, right dp 2+  Laboratory: Recent Labs  Lab 04/07/17 0216 04/07/17 0825  WBC 8.2  --   HGB 12.3* 15.3  HCT 38.5* 45.0  PLT 156  --    Recent Labs  Lab 04/07/17 0216  04/07/17 0941 04/07/17 1213 04/08/17 0335  NA 135   < > 135 135 136  K >7.5*   < > 5.3* 5.9* 6.2*  CL 103   < > 96* 100* 98*  CO2 16*  --  24 21* 23  BUN 147*   < > 63* 78* 94*  CREATININE 14.23*   < > 7.57* 9.57* 11.18*  CALCIUM 10.5*  --  9.5 9.1 8.8*  PROT 8.2*  --   --   --   --  BILITOT 0.9  --   --   --   --   ALKPHOS 84  --   --   --   --   ALT 22  --   --   --   --   AST 27  --   --   --   --   GLUCOSE 108*   < > 71 58* 70   < > = values in this interval not displayed.      Imaging/Diagnostic Tests: Dg Chest Port 1 View  Result Date: 04/07/2017 CLINICAL DATA:  Apnea, dialysis dependent renal failure, cardiomyopathy and CHF, COPD, coronary artery disease, current smoker. EXAM: PORTABLE CHEST 1 VIEW COMPARISON:   Portable chest x-ray of April 07, 2017 FINDINGS: The lungs are adequately inflated. There is a small right pleural effusion not greatly changed from the previous study. The cardiac silhouette is enlarged. The pulmonary vascularity is less engorged but still prominent. The interstitial markings are slightly less prominent overall. There is calcification in the wall of the aortic arch. The dialysis catheter tip projects at the cavoatrial junction. IMPRESSION: CHF. Slight interval decrease in pulmonary edema and pulmonary vascular congestion since earlier today. Persistent small right pleural effusion. Thoracic aortic atherosclerosis. Electronically Signed   By: David  Martinique M.D.   On: 04/07/2017 15:06   Dg Chest Port 1 View  Result Date: 04/07/2017 CLINICAL DATA:  61 year old male with shortness of breath. EXAM: PORTABLE CHEST 1 VIEW COMPARISON:  Chest radiograph dated 03/05/2017 FINDINGS: There is moderate cardiomegaly. Small bilateral pleural effusions with associated atelectatic changes of the lung bases, right greater left. Overall there has been interval progression of the pleural effusions and atelectatic changes of the lungs compared to prior radiograph. There is no pneumothorax. Left-sided dialysis catheter remains in similar position. Right axillary vascular stent noted. No acute osseous pathology. IMPRESSION: Cardiomegaly with progression of vascular congestion and pleural effusions compared to the prior radiograph. Electronically Signed   By: Anner Crete M.D.   On: 04/07/2017 02:26    Bonnita Hollow, MD 04/08/2017, 9:48 AM PGY-1, [place] Intern pager: [phone], text pages welcome

## 2017-04-08 NOTE — Progress Notes (Signed)
Pt declined BIPAP use at this time.  Pt resting comfortably with no distress.

## 2017-04-08 NOTE — Progress Notes (Signed)
Admit: 04/07/2017 LOS: 1  75M ESRD [place] missed HD with hyperkalemia and hypoxic RF  Subjective:  HD yesterday: post weight 72.5kg, 3.5L UF; tolerated well; full 4h Treatment Down to [place] some, BiPAP overnight Post HD CXR with edema / effusions improved but still present    11/12 0701 - 11/13 0700 In: 240 [P.O.:240] Out: 3500   Filed Weights   04/07/17 1029 04/07/17 1908 04/08/17 0446  Weight: 72.5 kg (159 lb 13.3 oz) 72.5 kg (159 lb 13.3 oz) 73.7 kg (162 lb 7.7 oz)    Scheduled Meds: . apixaban  5 mg Oral BID  . aspirin EC  81 mg Oral Daily  . atorvastatin  10 mg Oral QHS  . calcium acetate  667 mg Oral TID  . carvedilol  3.125 mg Oral BID WC  . docusate sodium  50 mg Oral Daily  . fluticasone  1 spray Each Nare Daily  . isosorbide mononitrate  30 mg Oral Daily  . loratadine  10 mg Oral Daily  . midodrine  5 mg Oral BID  . mometasone-formoterol  2 puff Inhalation BID  . multivitamin with minerals  1 tablet Oral Daily  . pantoprazole  20 mg Oral Daily  . pregabalin  75 mg Oral Daily  . sevelamer carbonate  800 mg Oral BID WC  . tiotropium  18 mcg Inhalation Daily   Continuous Infusions: . sodium chloride    . sodium chloride    . cefTAZidime (FORTAZ)  IV Stopped (04/07/17 1704)  . cefTAZidime (FORTAZ)  IV Stopped (04/07/17 0610)   PRN Meds:.sodium chloride, sodium chloride, albuterol, alteplase, guaifenesin, heparin, heparin, ipratropium-albuterol, lidocaine (PF), lidocaine-prilocaine, nitroGLYCERIN, pentafluoroprop-tetrafluoroeth, polyethylene glycol  Current Labs: reviewed  B Cx 11/2 x2: NGTD  Physical Exam:  Blood pressure 116/65, pulse 76, temperature 98.2 F (36.8 C), temperature source Axillary, resp. rate 18, height 6\' 2"  (1.88 m), weight 73.7 kg (162 lb 7.7 oz), SpO2 97 %. On BIPAP, NAD RRR L IJ TDC, 25mm cuff exposed LUE AVF +B/T S/nt/nd   Dialysis Orders: Center: [address]  on [employer] . EDW 82.5HD Bath 1K  Time 3.5  hrs Heparin 2000 bolus 600/hr. Access LIJ TDC and LUE AVF (placed 9/18) BFR 400 DFR 800      A 1. ESRD MWF [facility] via [facility] follows 2. Hyperkalemia, K 6.2 this AM 3. SOB / Hypervolemia; discrepancy in our weights and outpt records 4. Exposed cuff on TDC 5. Fever; 11/12 BCx pending 6. Anemia, not an issue 7. CKD BMPD, P and Ca ok  P 1. HD again today with hyperkalemia, via TDC; 1K x 55min ths 2K, 3.5h, 2.5 Ca, 3.5 L UF BP permitting, heparin 2. Once stable with K and Volume will need TDC replaced / exchanged   Pearson Grippe MD 04/08/2017, 8:33 AM  Recent Labs  Lab 04/07/17 0941 04/07/17 1213 04/08/17 0335  NA 135 135 136  K 5.3* 5.9* 6.2*  CL 96* 100* 98*  CO2 24 21* 23  GLUCOSE 71 58* 70  BUN 63* 78* 94*  CREATININE 7.57* 9.57* 11.18*  CALCIUM 9.5 9.1 8.8*  PHOS 4.2  --   --    Recent Labs  Lab 04/07/17 0216 04/07/17 0825  WBC 8.2  --   NEUTROABS 5.6  --   HGB 12.3* 15.3  HCT 38.5* 45.0  MCV 82.8  --   PLT 156  --

## 2017-04-09 ENCOUNTER — Other Ambulatory Visit: Payer: Self-pay | Admitting: Family Medicine

## 2017-04-09 DIAGNOSIS — T8241XA Breakdown (mechanical) of vascular dialysis catheter, initial encounter: Secondary | ICD-10-CM

## 2017-04-09 DIAGNOSIS — E8770 Fluid overload, unspecified: Secondary | ICD-10-CM

## 2017-04-09 LAB — CBC
HCT: 36.9 % — ABNORMAL LOW (ref 39.0–52.0)
HEMATOCRIT: 37.4 % — AB (ref 39.0–52.0)
HEMOGLOBIN: 12.1 g/dL — AB (ref 13.0–17.0)
Hemoglobin: 11.8 g/dL — ABNORMAL LOW (ref 13.0–17.0)
MCH: 26.7 pg (ref 26.0–34.0)
MCH: 26.3 pg (ref 26.0–34.0)
MCHC: 32.4 g/dL (ref 30.0–36.0)
MCHC: 32 g/dL (ref 30.0–36.0)
MCV: 82.4 fL (ref 78.0–100.0)
MCV: 82.2 fL (ref 78.0–100.0)
PLATELETS: 125 10*3/uL — AB (ref 150–400)
Platelets: 116 10*3/uL — ABNORMAL LOW (ref 150–400)
RBC: 4.54 MIL/uL (ref 4.22–5.81)
RBC: 4.49 MIL/uL (ref 4.22–5.81)
RDW: 19.5 % — ABNORMAL HIGH (ref 11.5–15.5)
RDW: 19.3 % — AB (ref 11.5–15.5)
WBC: 6.7 10*3/uL (ref 4.0–10.5)
WBC: 9.4 10*3/uL (ref 4.0–10.5)

## 2017-04-09 LAB — BASIC METABOLIC PANEL
ANION GAP: 11 (ref 5–15)
BUN: 46 mg/dL — AB (ref 6–20)
CHLORIDE: 98 mmol/L — AB (ref 101–111)
CO2: 24 mmol/L (ref 22–32)
Calcium: 8.7 mg/dL — ABNORMAL LOW (ref 8.9–10.3)
Creatinine, Ser: 7.76 mg/dL — ABNORMAL HIGH (ref 0.61–1.24)
GFR calc Af Amer: 8 mL/min — ABNORMAL LOW (ref >60)
GFR, EST NON AFRICAN AMERICAN: 7 mL/min — AB (ref >60)
Glucose, Bld: 102 mg/dL — ABNORMAL HIGH (ref 65–99)
POTASSIUM: 4.7 mmol/L (ref 3.5–5.1)
SODIUM: 133 mmol/L — AB (ref 135–145)

## 2017-04-09 LAB — RENAL FUNCTION PANEL
Albumin: 2.2 g/dL — ABNORMAL LOW (ref 3.5–5.0)
Anion gap: 13 (ref 5–15)
BUN: 58 mg/dL — AB (ref 6–20)
CALCIUM: 8.8 mg/dL — AB (ref 8.9–10.3)
CHLORIDE: 97 mmol/L — AB (ref 101–111)
CO2: 24 mmol/L (ref 22–32)
CREATININE: 9.53 mg/dL — AB (ref 0.61–1.24)
GFR calc Af Amer: 6 mL/min — ABNORMAL LOW (ref >60)
GFR, EST NON AFRICAN AMERICAN: 5 mL/min — AB (ref >60)
Glucose, Bld: 129 mg/dL — ABNORMAL HIGH (ref 65–99)
PHOSPHORUS: 6.7 mg/dL — AB (ref 2.5–4.6)
Potassium: 4.6 mmol/L (ref 3.5–5.1)
SODIUM: 134 mmol/L — AB (ref 135–145)

## 2017-04-09 MED ORDER — CEFAZOLIN SODIUM-DEXTROSE 2-4 GM/100ML-% IV SOLN
2.0000 g | INTRAVENOUS | Status: AC
  Administered 2017-04-10: 2 g via INTRAVENOUS
  Filled 2017-04-09: qty 100

## 2017-04-09 MED ORDER — HEPARIN SODIUM (PORCINE) 1000 UNIT/ML DIALYSIS
100.0000 [IU]/kg | INTRAMUSCULAR | Status: DC | PRN
  Administered 2017-04-09: 8100 [IU] via INTRAVENOUS_CENTRAL

## 2017-04-09 MED ORDER — HEPARIN SODIUM (PORCINE) 1000 UNIT/ML DIALYSIS
1000.0000 [IU] | INTRAMUSCULAR | Status: DC | PRN
  Administered 2017-04-09: 1000 [IU] via INTRAVENOUS_CENTRAL

## 2017-04-09 NOTE — Discharge Summary (Signed)
[facility] Discharge Summary  Patient name: [name] Medical record number: [id number] Date of birth: 07/14/1955 Age: 61 y.o. Gender: male Date of Admission: 04/07/2017  Date of Discharge: 04/10/2017 Admitting Physician: Kinnie Feil, MD  Primary Care Provider: Donnie Coffin, MD Consultants: Nephrology, IR  Indication for Hospitalization: volume overload, SOB in the setting of missed HD session  Discharge Diagnoses/Problem List:  Respiratory distress and weakness, improved COPD, stable Hyperkalemia, resolved ESRD (MWF), stable Hypertension with volume overload, stable Exposed catheter cuff, resolved Hyperlipidemia, stable  Disposition: SNF  Discharge Condition: Improved  Discharge Exam:  General: NAD, lying in bed Cardiovascular: rrr, no mrg Respiratory: CTAB, on 2L [place]  Abdomen: soft, non tender to palpation, +BS Extremities: right transtarsal amputation, left bka with stocking in place  Brief Hospital Course:  [name] a 61 y.o.malewith PMH significant forPVD s/p L BKA and right transmetatarsal amputation, ESRD (MWF), HFrEF (EF 20-25%), COPD (3L O2 at home), HTN, CAD who presents with increased shortness of breath and weaknessx2 days and found to be hyperkalemic >7.5 after missing 1 HD session 11/9. Patient underwent emergent dialysis 11/2 with subsequent sessions 11/3 and 11/14. Hyperkalemia resolved and was 4.6 at the time of discharge. It was also noted on admission that his HD catheter cuff was exposed and presented with temp of 49F with concern for possible infection. Blood cultures were drawn and Vancomycin and Certazidine started. Blood cultures resulted no growth at 2 days and patient's antibiotics were discontinued after 3 days (Vancomycin 11/12, Ceftazidime (11/12 - 11/15). Patient remained afebrile with stable vitals. IR replaced exposed catheter and patient is stable for discharge with PT/OT recommending  SNF.  Issues for Follow Up:  1. Will be discharged with DME O2. 2. Likely will need sleep study outpatient to further assess need for CPAP/BiPAP as patient required BiPAP some nights while hospitalized. Sated well on continuous 2L O2 via [place] the night before discharge.  Significant Procedures: IJ HD catheter replacement   Significant Labs and Imaging:  Recent Labs  Lab 04/07/17 0216 04/07/17 0825 04/09/17 0018 04/09/17 2059  WBC 8.2  --  6.7 9.4  HGB 12.3* 15.3 12.1* 11.8*  HCT 38.5* 45.0 37.4* 36.9*  PLT 156  --  116* 125*   Recent Labs  Lab 04/07/17 0216  04/07/17 0941 04/07/17 1213 04/08/17 0335 04/09/17 0018 04/09/17 2030 04/10/17 0503  NA 135   < > 135 135 136 133* 134* 133*  K >7.5*   < > 5.3* 5.9* 6.2* 4.7 4.6 4.6  CL 103   < > 96* 100* 98* 98* 97* 99*  CO2 16*  --  24 21* 23 24 24  21*  GLUCOSE 108*   < > 71 58* 70 102* 129* 97  BUN 147*   < > 63* 78* 94* 46* 58* 31*  CREATININE 14.23*   < > 7.57* 9.57* 11.18* 7.76* 9.53* 6.66*  CALCIUM 10.5*  --  9.5 9.1 8.8* 8.7* 8.8* 9.4  PHOS  --   --  4.2  --   --   --  6.7*  --   ALKPHOS 84  --   --   --   --   --   --   --   AST 27  --   --   --   --   --   --   --   ALT 22  --   --   --   --   --   --   --  ALBUMIN 2.9*  --  3.2*  --   --   --  2.2*  --    < > = values in this interval not displayed.    Dg Chest Port 1 View  Result Date: 04/07/2017 CLINICAL DATA:  Apnea, dialysis dependent renal failure, cardiomyopathy and CHF, COPD, coronary artery disease, current smoker. EXAM: PORTABLE CHEST 1 VIEW COMPARISON:  Portable chest x-ray of April 07, 2017 FINDINGS: The lungs are adequately inflated. There is a small right pleural effusion not greatly changed from the previous study. The cardiac silhouette is enlarged. The pulmonary vascularity is less engorged but still prominent. The interstitial markings are slightly less prominent overall. There is calcification in the wall of the aortic arch. The dialysis catheter  tip projects at the cavoatrial junction. IMPRESSION: CHF. Slight interval decrease in pulmonary edema and pulmonary vascular congestion since earlier today. Persistent small right pleural effusion. Thoracic aortic atherosclerosis. Electronically Signed   By: David  Martinique M.D.   On: 04/07/2017 15:06   Dg Chest Port 1 View  Result Date: 04/07/2017 CLINICAL DATA:  61 year old male with shortness of breath. EXAM: PORTABLE CHEST 1 VIEW COMPARISON:  Chest radiograph dated 03/05/2017 FINDINGS: There is moderate cardiomegaly. Small bilateral pleural effusions with associated atelectatic changes of the lung bases, right greater left. Overall there has been interval progression of the pleural effusions and atelectatic changes of the lungs compared to prior radiograph. There is no pneumothorax. Left-sided dialysis catheter remains in similar position. Right axillary vascular stent noted. No acute osseous pathology. IMPRESSION: Cardiomegaly with progression of vascular congestion and pleural effusions compared to the prior radiograph. Electronically Signed   By: Anner Crete M.D.   On: 04/07/2017 02:26   Results/Tests Pending at Time of Discharge: None  Discharge Medications:  Allergies as of 04/10/2017      Reactions   Dust Mite Extract Other (See Comments)   Reaction: unknown      Medication List    STOP taking these medications   warfarin 3 MG tablet Commonly known as:  COUMADIN     TAKE these medications   acetaminophen 500 MG tablet Commonly known as:  TYLENOL Take by mouth.   albuterol 108 (90 Base) MCG/ACT inhaler Commonly known as:  PROVENTIL HFA;VENTOLIN HFA Inhale 1-2 puffs every 6 (six) hours as needed into the lungs for wheezing or shortness of breath.   apixaban 5 MG Tabs tablet Commonly known as:  ELIQUIS Take 1 tablet (5 mg total) by mouth 2 (two) times daily.   aspirin EC 325 MG tablet Take by mouth.   atorvastatin 10 MG tablet Commonly known as:  LIPITOR Take 10 mg  by mouth at bedtime.   budesonide-formoterol 160-4.5 MCG/ACT inhaler Commonly known as:  SYMBICORT Inhale 2 puffs into the lungs 2 (two) times daily. Reported on 08/14/2015   calcium acetate 667 MG capsule Commonly known as:  PHOSLO Take 667 mg 3 (three) times daily by mouth.   carvedilol 3.125 MG tablet Commonly known as:  COREG Take 1 tablet (3.125 mg total) by mouth 2 (two) times daily with a meal.   cetirizine 10 MG tablet Commonly known as:  ZYRTEC Take 10 mg by mouth daily.   COMBIVENT RESPIMAT 20-100 MCG/ACT Aers respimat Generic drug:  Ipratropium-Albuterol Inhale 1 puff into the lungs every 6 (six) hours as needed for wheezing or shortness of breath.   docusate sodium 100 MG capsule Commonly known as:  COLACE Take by mouth.   fluticasone 50 MCG/ACT nasal spray Commonly  known as:  FLONASE Place 1 spray daily into the nose.   guaifenesin 100 MG/5ML syrup Commonly known as:  ROBITUSSIN Take 200 mg by mouth every 6 (six) hours as needed for cough.   isosorbide mononitrate 30 MG 24 hr tablet Commonly known as:  IMDUR Take by mouth.   midodrine 10 MG tablet Commonly known as:  PROAMATINE Take 5 mg by mouth 2 (two) times daily.   multivitamin with minerals Tabs tablet Take 1 tablet by mouth 2 (two) times daily.   nitroGLYCERIN 0.4 MG SL tablet Commonly known as:  NITROSTAT Place 0.4 mg under the tongue every 5 (five) minutes as needed for chest pain.   pantoprazole 20 MG tablet Commonly known as:  PROTONIX Take 20 mg by mouth daily.   polyethylene glycol packet Commonly known as:  MIRALAX / GLYCOLAX Take by mouth.   pregabalin 75 MG capsule Commonly known as:  LYRICA Take 1 capsule (75 mg total) by mouth daily. What changed:    how much to take  when to take this   RENAGEL 800 MG tablet Generic drug:  sevelamer Take 800 mg every morning by mouth.   sevelamer carbonate 800 MG tablet Commonly known as:  RENVELA Take 800 mg by mouth 2 (two) times  daily.   tiotropium 18 MCG inhalation capsule Commonly known as:  SPIRIVA Place 18 mcg into inhaler and inhale daily.            Durable Medical Equipment  (From admission, onward)        Start     Ordered   04/09/17 1047  For home use only DME oxygen  Once    Comments:  Needs to be on 3-4L O2 continuous until sleep study (to be ordered outpatient)  Question Answer Comment  Mode or (Route) Nasal cannula   Liters per Minute 3   Frequency Continuous (stationary and portable oxygen unit needed)   Oxygen delivery system Gas      04/09/17 1048      Discharge Instructions: Please refer to Patient Instructions section of EMR for full details.  Patient was counseled important signs and symptoms that should prompt return to medical care, changes in medications, dietary instructions, activity restrictions, and follow up appointments.   Follow-Up Appointments: Contact information for after-discharge care    [facility] .   Service:  Skilled Nursing Contact information: [address] [place], [place], DO 04/10/2017, 11:33 AM PGY-1, [facility]

## 2017-04-09 NOTE — Progress Notes (Signed)
Admit: 04/07/2017 LOS: 2  74M ESRD [place] missed HD with hyperkalemia and hypoxic RF  Subjective:  HD yesterday off schedule: post weight 78.5 kg, 4L UF; tolerated well; full Treatment No clear fever, WBC WNL On [place] 3L SpO2> 90%; pt more awake, alert, ready for breakfast, much improved K 4.7 this AM  11/13 0701 - 11/14 0700 In: 720 [P.O.:720] Out: 4000   Filed Weights   04/08/17 1815 04/08/17 2045 04/09/17 0325  Weight: 78.5 kg (173 lb 1 oz) 76.4 kg (168 lb 6.9 oz) 81.3 kg (179 lb 4.8 oz)    Scheduled Meds: . apixaban  5 mg Oral BID  . aspirin EC  81 mg Oral Daily  . atorvastatin  10 mg Oral QHS  . calcium acetate  667 mg Oral TID  . carvedilol  3.125 mg Oral BID WC  . docusate sodium  50 mg Oral Daily  . fluticasone  1 spray Each Nare Daily  . isosorbide mononitrate  30 mg Oral Daily  . loratadine  10 mg Oral Daily  . midodrine  5 mg Oral BID  . mometasone-formoterol  2 puff Inhalation BID  . multivitamin with minerals  1 tablet Oral Daily  . pantoprazole  20 mg Oral Daily  . pregabalin  75 mg Oral Daily  . sevelamer carbonate  800 mg Oral BID WC  . tiotropium  18 mcg Inhalation Daily   Continuous Infusions: . cefTAZidime (FORTAZ)  IV Stopped (04/07/17 0610)  . cefTAZidime (FORTAZ)  IV Stopped (04/08/17 2045)   PRN Meds:.albuterol, guaifenesin, ipratropium-albuterol, nitroGLYCERIN, polyethylene glycol  Current Labs: reviewed  B Cx 11/2 x2: NGTD  Physical Exam:  Blood pressure (!) 138/52, pulse 75, temperature 99.5 F (37.5 C), temperature source Oral, resp. rate 14, height 6\' 2"  (1.88 m), weight 81.3 kg (179 lb 4.8 oz), SpO2 96 %. On BIPAP, NAD RRR L IJ TDC, 47mm cuff exposed LUE AVF +B/T S/nt/nd   Dialysis Orders: Center: [address]  on [employer] . EDW 82.5HD [place] 1K  Time 3.5 hrs Heparin 2000 bolus 600/hr. Access LIJ TDC and LUE AVF (placed 9/18) BFR 400 DFR 800      A 1. ESRD MWF [facility]  follows 2. Hyperkalemia, resolved with serial HD 3. SOB / Hypervolemia; discrepancy in our weights and outpt records; improved 4. Exposed cuff on TDC -- still present 5. Fever; 11/12 BCx NGTD, rec Vanc/Ceftaz 6. Anemia, not an issue 7. CKD BMPD, P and Ca ok  P 1. HD today on schedule via TDC; 2K, 3.5h, 2.5 Ca, Attempt 4L UF BP permitting, heparin 2. After HD today ask IR to eval for [facility] exchange   Pearson Grippe MD 04/09/2017, 8:18 AM  Recent Labs  Lab 04/07/17 0941 04/07/17 1213 04/08/17 0335 04/09/17 0018  NA 135 135 136 133*  K 5.3* 5.9* 6.2* 4.7  CL 96* 100* 98* 98*  CO2 24 21* 23 24  GLUCOSE 71 58* 70 102*  BUN 63* 78* 94* 46*  CREATININE 7.57* 9.57* 11.18* 7.76*  CALCIUM 9.5 9.1 8.8* 8.7*  PHOS 4.2  --   --   --    Recent Labs  Lab 04/07/17 0216 04/07/17 0825 04/09/17 0018  WBC 8.2  --  6.7  NEUTROABS 5.6  --   --   HGB 12.3* 15.3 12.1*  HCT 38.5* 45.0 37.4*  MCV 82.8  --  82.4  PLT 156  --  116*

## 2017-04-09 NOTE — Clinical Social Work Note (Signed)
Clinical Social Work Assessment  Patient Details  Name: [name] MRN: [id number] Date of Birth: 08-Oct-1955  Date of referral:  04/09/17               Reason for consult:  Discharge Planning                Permission sought to share information with:  Family Supports Permission granted to share information::  Yes, Verbal Permission Granted  Name::     Koren Shiver  Agency::  SNF  Relationship::  daughter  Contact Information:  [phone]  Housing/Transportation Living arrangements for the past 2 months:  [place] of Information:  Patient, Adult Children Patient Interpreter Needed:  None Criminal Activity/Legal Involvement Pertinent to Current Situation/Hospitalization:  No - Comment as needed Significant Relationships:  Adult Children Lives with:  Self Do you feel safe going back to the place where you live?  Yes Need for family participation in patient care:  Yes (Comment)  Care giving concerns:  Patients daughter Alyse Low at bedside. Patient was in and out of sleep during assessment so patients daughter answered CSW question.   Social Worker assessment / plan:  CSW met patient and daughter at bedside. Patients daughter supportive of patients and his needs. Alyse Low stated that she has been looking at ALF in the [place] area to place patient in after discharge. CSW informed family that at this time patient will need a SNF level care until patient is abel to get back to baseline. Patient and daughter are agreeable to discharge to SNF as long as its in [place]. CSW faxed out patient and will follow up once bed availabilities are available    Employment status:  Retired Forensic scientist:  Medicare PT Recommendations:  [place] / Referral to community resources:  [place]  Patient/Family's Response to care:  Family supportive of patient. Family verbalized appreciation of CSW role in care.    Patient/Family's Understanding of and Emotional Response to Diagnosis, Current Treatment, and Prognosis:  Family/pateints agreeable to discharge to SNF   Emotional Assessment Appearance:  Appears stated age Attitude/Demeanor/Rapport:  Other Affect (typically observed):  Calm, Pleasant Orientation:  Oriented to Place, Oriented to  Time, Oriented to Self, Oriented to Situation Alcohol / Substance use:  Not Applicable Psych involvement (Current and /or in the community):  No (Comment)  Discharge Needs  Concerns to be addressed:  No discharge needs identified Readmission within the last 30 days:  No Current discharge risk:  None Barriers to Discharge:  No Barriers Identified   Wende Neighbors, LCSW 04/09/2017, 4:50 PM

## 2017-04-09 NOTE — Progress Notes (Signed)
Pt put on bipap for the night tolerating well

## 2017-04-09 NOTE — Progress Notes (Signed)
HD tx initiated via HD cath w/ cath function issues, AP increased resistance pulling as if clotted but eventually get blood return, VP pull/push/flush well, lines reversed, VSS w/ soft bp on pre tx assess, MD on call was paged @ 2008 and again @ 2018 to request additional dose of midodrine or albumin admin or any other orders but page was never returned. Order was for 4L off but I initiated tx at 1L goal d/t soft bp, will increase over tx if bp increased, will cont to monitor

## 2017-04-09 NOTE — Plan of Care (Signed)
  No Outcome Acute Rehab PT Goals(only PT should resolve) Pt Will Go Supine/Side To Sit 04/09/2017 1514 by Nakita Santerre, [place] Taken 04/09/2017 1514  Pt will go Supine/Side to Sit with modified independence Patient Will Transfer Sit To/From Stand 04/09/2017 1514 by Emmah Bratcher, [place] Taken 04/09/2017 1514  Patient will transfer sit to/from stand with moderate assist;with +2;from elevated surface Pt Will Transfer Bed To Chair/Chair To Bed 04/09/2017 1514 by Jazmine Heckman, [place] Taken 04/09/2017 1514  Pt will Transfer Bed to Chair/Chair to Bed with min assist;Other (comment) Note With sliding board or squat pivot.

## 2017-04-09 NOTE — NC FL2 (Signed)
[place] LEVEL OF CARE SCREENING TOOL     IDENTIFICATION  Patient Name: [name] Birthdate: 1956/01/11 Sex: male Admission Date (Current Location): 04/07/2017  [place] and [place] Number:  Kathleen Argue [id number] [place] and Address:  [facility], [place] [address] [place],  [address]      Provider Number: [id number]  Attending Physician Name and Address:  Dickie La, MD  Relative Name and Phone Number:  Koren Shiver, [phone]    Current Level of Care: Hospital Recommended Level of Care: Other (Comment) Prior Approval Number:    Date Approved/Denied:   PASRR Number: [id number]  Discharge Plan: SNF    Current Diagnoses: Patient Active Problem List   Diagnosis Date Noted  . Apnea   . End-stage renal disease on hemodialysis ([facility])   . Hypervolemia   . Respiratory distress   . Altered mental status   . Acute on chronic systolic CHF (congestive heart failure) ([place]) 01/18/2017  . Respiratory failure with hypoxia ([place]) 11/11/2016  . Acute respiratory failure ([place]) 10/28/2016  . Pulmonary embolism ([place]) 08/03/2016  . NSTEMI (non-ST elevated myocardial infarction) ([place]) 07/26/2016  . Acute respiratory failure with hypoxia ([place]) 05/24/2016  . Right lower lobe pneumonia ([place]) 05/24/2016  . Elevated troponin 05/24/2016  . Pulmonary edema 05/21/2016  . Kidney dialysis as the cause of abnormal reaction of the patient, or of later complication, without mention of misadventure at the time of the procedure (CODE) 03/05/2016  . PVD (peripheral vascular disease) ([place]) 03/05/2016  . Preop examination 05/02/2015  . Chronic systolic heart failure ([place])   . Sepsis ([place]) 01/13/2015  . Acute encephalopathy 01/13/2015  . Hyperkalemia 01/13/2015  . Gangrene of foot ([place]) 05/04/2014  . ESRD (end stage renal disease) on dialysis ([place]) 05/04/2014  . CAD (coronary artery disease) 05/04/2014  . Normocytic anemia 05/04/2014  .  Hyperlipidemia 04/26/2010  . HYPERTENSION, BENIGN 04/26/2010  . CAD, NATIVE VESSEL 04/26/2010    Orientation RESPIRATION BLADDER Height & Weight     Time, Self, Situation, Place  O2(3L) Continent Weight: 179 lb 4.8 oz (81.3 kg) Height:  6\' 2"  (188 cm)  BEHAVIORAL SYMPTOMS/MOOD NEUROLOGICAL BOWEL NUTRITION STATUS      Continent Diet(Renal with fluid restriction 1251ml)  AMBULATORY STATUS COMMUNICATION OF NEEDS Skin   (unable to assess at this time since pt did not have prosthetic leg with him) Verbally Normal, Other (Comment)(old Left amputation )                       Personal Care Assistance Level of Assistance  Feeding, Dressing, Bathing Bathing Assistance: Limited assistance Feeding assistance: Independent Dressing Assistance: Limited assistance     Functional Limitations Info  Sight, Hearing, Speech Sight Info: Adequate Hearing Info: Adequate Speech Info: Adequate    SPECIAL CARE FACTORS FREQUENCY  PT (By licensed PT), OT (By licensed OT)     PT Frequency: 3x wk OT Frequency: 3x wk            Contractures Contractures Info: Not present    Additional Factors Info  Allergies, Code Status Code Status Info: Full Code Allergies Info: DUST MITE EXTRACT           Current Medications (04/09/2017):  This is the current hospital active medication list Current Facility-Administered Medications  Medication Dose Route Frequency Provider Last Rate Last Dose  . albuterol (PROVENTIL) (2.5 MG/3ML) 0.083% nebulizer solution 2.5 mg  2.5 mg Inhalation Q6H PRN Eloise Levels, MD      .  apixaban (ELIQUIS) tablet 5 mg  5 mg Oral BID Eloise Levels, MD   5 mg at 04/09/17 0913  . aspirin EC tablet 81 mg  81 mg Oral Daily Eloise Levels, MD   81 mg at 04/08/17 0945  . atorvastatin (LIPITOR) tablet 10 mg  10 mg Oral QHS Eloise Levels, MD   10 mg at 04/08/17 2310  . calcium acetate (PHOSLO) capsule 667 mg  667 mg Oral TID Eloise Levels, MD   667 mg at 04/09/17 0913   . carvedilol (COREG) tablet 3.125 mg  3.125 mg Oral BID WC Eloise Levels, MD   3.125 mg at 04/08/17 2320  . ceFAZolin (ANCEF) IVPB 2g/100 mL premix  2 g Intravenous To OR Saverio Danker, PA-C      . cefTAZidime (FORTAZ) 2 g in dextrose 5 % 50 mL IVPB  2 g Intravenous Q M,W,F-HD Jovi, Zavadil, [place]   Stopped at 04/08/17 2045  . docusate sodium (COLACE) capsule 50 mg  50 mg Oral Daily Eloise Levels, MD   50 mg at 04/07/17 1556  . fluticasone (FLONASE) 50 MCG/ACT nasal spray 1 spray  1 spray Each Nare Daily Eloise Levels, MD   1 spray at 04/09/17 1307  . guaifenesin (ROBITUSSIN) 100 MG/5ML syrup 200 mg  200 mg Oral Q6H PRN Eloise Levels, MD      . ipratropium-albuterol (DUONEB) 0.5-2.5 (3) MG/3ML nebulizer solution 3 mL  3 mL Nebulization Q6H PRN Dickie La, MD      . isosorbide mononitrate (IMDUR) 24 hr tablet 30 mg  30 mg Oral Daily Eloise Levels, MD   30 mg at 04/08/17 2320  . loratadine (CLARITIN) tablet 10 mg  10 mg Oral Daily Eloise Levels, MD   10 mg at 04/08/17 0945  . midodrine (PROAMATINE) tablet 5 mg  5 mg Oral BID Eloise Levels, MD   5 mg at 04/09/17 1307  . mometasone-formoterol (DULERA) 200-5 MCG/ACT inhaler 2 puff  2 puff Inhalation BID Eloise Levels, MD   2 puff at 04/09/17 0910  . multivitamin with minerals tablet 1 tablet  1 tablet Oral Daily Eloise Levels, MD   1 tablet at 04/08/17 0944  . nitroGLYCERIN (NITROSTAT) SL tablet 0.4 mg  0.4 mg Sublingual Q5 min PRN Eloise Levels, MD      . pantoprazole (PROTONIX) EC tablet 20 mg  20 mg Oral Daily Eloise Levels, MD   20 mg at 04/08/17 0944  . polyethylene glycol (MIRALAX / GLYCOLAX) packet 17 g  17 g Oral Daily PRN Eloise Levels, MD      . pregabalin (LYRICA) capsule 75 mg  75 mg Oral Daily Eloise Levels, MD   75 mg at 04/08/17 0944  . sevelamer carbonate (RENVELA) tablet 800 mg  800 mg Oral BID WC Eloise Levels, MD   800 mg at 04/09/17 0913  . tiotropium (SPIRIVA) inhalation capsule 18  mcg  18 mcg Inhalation Daily Eloise Levels, MD   18 mcg at 04/09/17 0909     Discharge Medications: Please see discharge summary for a list of discharge medications.  Relevant Imaging Results:  Relevant Lab Results:   Additional Information SS#  [id number]  Wende Neighbors, LCSW

## 2017-04-09 NOTE — Progress Notes (Signed)
Orthopedic Tech Progress Note Patient Details:  [name] 17-Mar-1956 [id number]  Patient ID: [name], male   DOB: May 05, 1956, 61 y.o.   MRN: [id number]   [name] 04/09/2017, 2:12 PM Called in hanger brace order; spoke with [facility]

## 2017-04-09 NOTE — Evaluation (Signed)
Physical Therapy Evaluation Patient Details Name: [name] MRN: [id number] DOB: 01/08/1956 Today's Date: 04/09/2017   History of Present Illness  61 Y/O M with PMX of ESRD on HD MWF, HTN, HLD, COPD, CHF presented with SOB, cough and change in mental status.  Clinical Impression  Pt admitted with/for with SOB and AMS, suspected due to missing HD/?CHF.  Pt notably weak and having difficulty with transfers, needing min assist for safety in the least.  Pt currently limited functionally due to the problems listed. ( See problems list.)   Pt will benefit from PT to maximize function and safety in order to get ready for next venue listed below.     Follow Up Recommendations SNF    Equipment Recommendations  None recommended by PT    Recommendations for Other Services       Precautions / Restrictions        Mobility  Bed Mobility Overal bed mobility: Needs Assistance Bed Mobility: Sidelying to Sit   Sidelying to sit: Min guard       General bed mobility comments: up via right elbow  Transfers Overall transfer level: Needs assistance   Transfers: Lateral/Scoot Transfers          Lateral/Scoot Transfers: Min assist General transfer comment: pt has stopped doing squat-pivot transfers at some point in the recent past and is now scooting between surfaces without sliding board.  Gave pt min support assist for safety.  Ambulation/Gait             General Gait Details: NT, no L prosthesis available to attempt.  Stairs            Wheelchair Mobility    Modified Rankin (Stroke Patients Only)       Balance Overall balance assessment: Needs assistance Sitting-balance support: No upper extremity supported;Single extremity supported Sitting balance-Leahy Scale: Fair Sitting balance - Comments: unsupported at EOB                                     Pertinent Vitals/Pain Pain Assessment: No/denies pain    Home Living Family/patient  expects to be discharged to:: Unsure Living Arrangements: Children Available Help at Discharge: Available PRN/intermittently;Family Type of Home: Apartment Home Access: Level entry     Home Layout: One level Home Equipment: [place] - 2 wheels;Wheelchair - manual;Grab bars - toilet;Grab bars - tub/shower      Prior Function           Comments: pt reports has not used prosthesis in about 3 months     Hand Dominance   Dominant Hand: Right    Extremity/Trunk Assessment   Upper Extremity Assessment Upper Extremity Assessment: (general weakness, but functional enough to boost and scoot)    Lower Extremity Assessment Lower Extremity Assessment: Generalized weakness;RLE deficits/detail;LLE deficits/detail RLE Deficits / Details: functional for transfer support, but not strong enough to stand without significant assist LLE Deficits / Details: genralized weakness       Communication   Communication: No difficulties  Cognition Arousal/Alertness: Awake/alert Behavior During Therapy: WFL for tasks assessed/performed Overall Cognitive Status: Within Functional Limits for tasks assessed                                        General Comments General comments (skin integrity, edema, etc.): Financial controller  to make them aware pt needs prosthetic check and a shrinker sock.  Contacted teaching service for order.  Relayed all information to the pt/dtr.    Exercises     Assessment/Plan    PT Assessment Patient needs continued PT services  PT Problem List Decreased strength;Decreased activity tolerance;Decreased mobility;Decreased knowledge of use of DME       PT Treatment Interventions DME instruction;Functional mobility training;Therapeutic activities;Balance training;Patient/family education    PT Goals (Current goals can be found in the Care Plan section)  Acute Rehab PT Goals Patient Stated Goal: live by myself PT Goal Formulation: With  patient Time For Goal Achievement: 04/16/17 Potential to Achieve Goals: Good    Frequency Min 2X/week   Barriers to discharge        Co-evaluation               AM-PAC PT "6 Clicks" Daily Activity  Outcome Measure Difficulty turning over in bed (including adjusting bedclothes, sheets and blankets)?: A Little Difficulty moving from lying on back to sitting on the side of the bed? : Unable Difficulty sitting down on and standing up from a chair with arms (e.g., wheelchair, bedside commode, etc,.)?: Unable Help needed moving to and from a bed to chair (including a wheelchair)?: A Little Help needed walking in hospital room?: Total Help needed climbing 3-5 steps with a railing? : Total 6 Click Score: 10    End of Session   Activity Tolerance: Patient tolerated treatment well Patient left: in chair Nurse Communication: Mobility status PT Visit Diagnosis: Other abnormalities of gait and mobility (R26.89);Muscle weakness (generalized) (M62.81)    Time: 0865-7846 PT Time Calculation (min) (ACUTE ONLY): 36 min   Charges:   PT Evaluation $PT Eval Moderate Complexity: 1 Mod PT Treatments $Therapeutic Activity: 8-22 mins   PT G Codes:        04-13-17  Donnella Sham, PT [phone]  (pager)  [name] 2017/04/13, 3:10 PM

## 2017-04-09 NOTE — Progress Notes (Signed)
Family Medicine Teaching Service Daily Progress Note Intern Pager: [phone]  Patient name: [name] Medical record number: [id number] Date of birth: 05-Jun-1955 Age: 61 y.o. Gender: male  Primary Care Provider: Donnie Coffin, MD Consultants: Nephrology Code Status: Full   Pt Overview and Major Events to Date:  [name] is a 61 y.o. male with ESRD (MWF) with increased shortness of breath and weakness x2 days found to be hyperkalemic to greater than 7.5 after missing dialysis on Friday. PMH is significant for PVD s/p L BKA and right transmetatarsal amputation, ESRD (MWD), HFrEF (EF 20-25%), COPD (3L O2 at home), HTN, CAD  11/12 - Emergent dialysis 11/13 - Repeat dialysis  Assessment and Plan:  Respiratory distress and weakness 2/2 missed HD Improved. S/p 2 HD sessions 11/12, 11/13. Getting another HD session today. ABG wnl. CBG was low to 50s. CXR 11/12 with decrease in pulmonary edema from previous CXR that day. Satting 93% on room air. Placed on BiPAP overnight. No SOB today. - likely need CPAP/BiPAP at night - continuous pulse ox - nephrology following  - continue COPD medication as below - q4 neuro checks - continuous cardiac monitoring  Hyperkalemia, resolved S/p HD on 11/12, 11/13. 7.5>>4.7.  Repeat EKG unchanged. - monitor with BMP  ESRD (MWF) Missed HD 04/04/17. Reports that he usually does not miss any sessions. Hyperkalemic >7.5, Ca 10.5 and bicarb to 16. S/p 2 HD sessions. Getting HD today. - nephrology following - f/u renal studies  Hypertension with volume overload Intermittently hypertensive. Reportedly overloaded per nephrology.  - continue coreg, imdur  COPD On 3L [place] at home. No report of increased sputum production, fevers or chills. Increased WOB 2/2 missed HD on presentation. Currently on room air and satting appropriately. Has not required PRN albuterol or duonebs. - cont albuterol PRN, duonebs  Exposed catheter cuff, concern for  infection Temperature to 99.5 and has exposed cuff. Nephrology ordered blood cultures and empirically started on vanc and ceftazidime.  - f/u blood cultures - NGTD x1d - replace catheter after HD today - cont vanc/ceftazidine until cath replacement. - trend temperatures  Hyperlipidemia - Cont statin  FEN/GI: Renal diet Prophylaxis: eliquis  Disposition: pending catheter replacement  Subjective:  Pt feels better, no CP, SOB, or abdominal pain.  Objective: Temp:  [98 F (36.7 C)-99.5 F (37.5 C)] 99.5 F (37.5 C) (11/14 0416) Pulse Rate:  [63-90] 75 (11/14 0416) Resp:  [10-18] 14 (11/14 0416) BP: (97-159)/(38-104) 138/52 (11/13 2313) SpO2:  [94 %-99 %] 96 % (11/14 0149) Weight:  [168 lb 6.9 oz (76.4 kg)-183 lb 13.8 oz (83.4 kg)] 179 lb 4.8 oz (81.3 kg) (11/14 0325)  Physical Exam: General: NAD,lying in bed Cardiovascular: rrr, no mrg Respiratory: CTAB, on room air  Abdomen: soft, non tender to palpation, +BS Extremities: right transtarsal amputation, left bka, right dp 2+  Laboratory: Recent Labs  Lab 04/07/17 0216 04/07/17 0825 04/09/17 0018  WBC 8.2  --  6.7  HGB 12.3* 15.3 12.1*  HCT 38.5* 45.0 37.4*  PLT 156  --  116*   Recent Labs  Lab 04/07/17 0216  04/07/17 1213 04/08/17 0335 04/09/17 0018  NA 135   < > 135 136 133*  K >7.5*   < > 5.9* 6.2* 4.7  CL 103   < > 100* 98* 98*  CO2 16*   < > 21* 23 24  BUN 147*   < > 78* 94* 46*  CREATININE 14.23*   < > 9.57* 11.18* 7.76*  CALCIUM 10.5*   < > 9.1 8.8* 8.7*  PROT 8.2*  --   --   --   --   BILITOT 0.9  --   --   --   --   ALKPHOS 84  --   --   --   --   ALT 22  --   --   --   --   AST 27  --   --   --   --   GLUCOSE 108*   < > 58* 70 102*   < > = values in this interval not displayed.   Imaging/Diagnostic Tests: Dg Chest Port 1 View  Result Date: 04/07/2017 CLINICAL DATA:  Apnea, dialysis dependent renal failure, cardiomyopathy and CHF, COPD, coronary artery disease, current smoker. EXAM:  PORTABLE CHEST 1 VIEW COMPARISON:  Portable chest x-ray of April 07, 2017 FINDINGS: The lungs are adequately inflated. There is a small right pleural effusion not greatly changed from the previous study. The cardiac silhouette is enlarged. The pulmonary vascularity is less engorged but still prominent. The interstitial markings are slightly less prominent overall. There is calcification in the wall of the aortic arch. The dialysis catheter tip projects at the cavoatrial junction. IMPRESSION: CHF. Slight interval decrease in pulmonary edema and pulmonary vascular congestion since earlier today. Persistent small right pleural effusion. Thoracic aortic atherosclerosis. Electronically Signed   By: David  Martinique M.D.   On: 04/07/2017 15:06   Dg Chest Port 1 View  Result Date: 04/07/2017 CLINICAL DATA:  61 year old male with shortness of breath. EXAM: PORTABLE CHEST 1 VIEW COMPARISON:  Chest radiograph dated 03/05/2017 FINDINGS: There is moderate cardiomegaly. Small bilateral pleural effusions with associated atelectatic changes of the lung bases, right greater left. Overall there has been interval progression of the pleural effusions and atelectatic changes of the lungs compared to prior radiograph. There is no pneumothorax. Left-sided dialysis catheter remains in similar position. Right axillary vascular stent noted. No acute osseous pathology. IMPRESSION: Cardiomegaly with progression of vascular congestion and pleural effusions compared to the prior radiograph. Electronically Signed   By: Anner Crete M.D.   On: 04/07/2017 02:26    Rory Percy, DO 04/09/2017, 7:34 AM PGY-1, [place] Intern pager: [phone], text pages welcome

## 2017-04-09 NOTE — Care Management Note (Signed)
Case Management Note Marvetta Gibbons RN, BSN Unit 4E-Case Manager [phone]  Patient Details  Name: ETHYN SCHETTER MRN: [id number] Date of Birth: 1955/07/01  Subjective/Objective:  Pt admitted with hyperkalemia/AMS- hx ESRD- HD-m/w/f            Action/Plan: PTA pt lived at home- baseline w/c bound- has w/c and RW at home- also has home 02 with [facility] - baseline 3L- was active with [facility] PTA with [facility]- will need resumption order prior to discharge- daughter will bring in portable concentrator for discharge.   Expected Discharge Date:                  Expected Discharge Plan:  [place]  In-House Referral:     Discharge planning Services  CM Consult  Post Acute Care Choice:  Durable Medical Equipment, Home Health, Resumption of Svcs/PTA Provider Choice offered to:  Patient, Adult Children  DME Arranged:  Oxygen DME Agency:  [place]:  RN [facility] Agency:  [place]  Status of Service:  In process, will continue to follow  If discussed at Long Length of Stay Meetings, dates discussed:    Discharge Disposition:   Additional Comments:  Dawayne Patricia, RN 04/09/2017, 3:31 PM

## 2017-04-10 ENCOUNTER — Encounter (INDEPENDENT_AMBULATORY_CARE_PROVIDER_SITE_OTHER): Payer: Medicare Other

## 2017-04-10 ENCOUNTER — Inpatient Hospital Stay (HOSPITAL_COMMUNITY): Payer: Medicare Other

## 2017-04-10 ENCOUNTER — Encounter (HOSPITAL_COMMUNITY): Payer: Self-pay | Admitting: Diagnostic Radiology

## 2017-04-10 ENCOUNTER — Ambulatory Visit (INDEPENDENT_AMBULATORY_CARE_PROVIDER_SITE_OTHER): Payer: Medicare Other | Admitting: Vascular Surgery

## 2017-04-10 HISTORY — PX: IR FLUORO GUIDE CV LINE LEFT: IMG2282

## 2017-04-10 LAB — BASIC METABOLIC PANEL
Anion gap: 13 (ref 5–15)
BUN: 31 mg/dL — AB (ref 6–20)
CALCIUM: 9.4 mg/dL (ref 8.9–10.3)
CHLORIDE: 99 mmol/L — AB (ref 101–111)
CO2: 21 mmol/L — AB (ref 22–32)
CREATININE: 6.66 mg/dL — AB (ref 0.61–1.24)
GFR calc Af Amer: 9 mL/min — ABNORMAL LOW (ref >60)
GFR calc non Af Amer: 8 mL/min — ABNORMAL LOW (ref >60)
GLUCOSE: 97 mg/dL (ref 65–99)
Potassium: 4.6 mmol/L (ref 3.5–5.1)
Sodium: 133 mmol/L — ABNORMAL LOW (ref 135–145)

## 2017-04-10 LAB — GLUCOSE, CAPILLARY
GLUCOSE-CAPILLARY: 159 mg/dL — AB (ref 65–99)
Glucose-Capillary: 96 mg/dL (ref 65–99)

## 2017-04-10 MED ORDER — CHLORHEXIDINE GLUCONATE 4 % EX LIQD
CUTANEOUS | Status: AC
  Administered 2017-04-10: 1
  Filled 2017-04-10: qty 15

## 2017-04-10 MED ORDER — LIDOCAINE HCL 1 % IJ SOLN
INTRAMUSCULAR | Status: DC | PRN
  Administered 2017-04-10: 3 mL

## 2017-04-10 MED ORDER — HEPARIN SODIUM (PORCINE) 1000 UNIT/ML IJ SOLN
INTRAMUSCULAR | Status: DC | PRN
  Administered 2017-04-10: 4.2 mL

## 2017-04-10 MED ORDER — DOCUSATE SODIUM 100 MG PO CAPS
100.0000 mg | ORAL_CAPSULE | Freq: Every day | ORAL | Status: DC
  Administered 2017-04-10: 100 mg via ORAL
  Filled 2017-04-10: qty 1

## 2017-04-10 MED ORDER — CEFAZOLIN SODIUM-DEXTROSE 2-4 GM/100ML-% IV SOLN
INTRAVENOUS | Status: AC
  Filled 2017-04-10: qty 100

## 2017-04-10 MED ORDER — LIDOCAINE HCL 1 % IJ SOLN
INTRAMUSCULAR | Status: AC
  Filled 2017-04-10: qty 20

## 2017-04-10 MED ORDER — HEPARIN SODIUM (PORCINE) 1000 UNIT/ML IJ SOLN
INTRAMUSCULAR | Status: AC
  Filled 2017-04-10: qty 1

## 2017-04-10 NOTE — Progress Notes (Signed)
Admit: 04/07/2017 LOS: 3  89M ESRD [place] missed HD with hyperkalemia and hypoxic RF  Subjective:  HD yesterday: post weight 78.7kg, 2.3L UF; tolerated well; not full treatment system clotted Breathing well on [place] For HD [facility] exchange today 2/2 exposed cuff; B Cx NGTD, Afebrile WBC ok  11/14 0701 - 11/15 0700 In: 360 [P.O.:360] Out: 2317   Filed Weights   04/09/17 0325 04/09/17 2005 04/09/17 2342  Weight: 81.3 kg (179 lb 4.8 oz) 81 kg (178 lb 9.2 oz) 78.7 kg (173 lb 8 oz)    Scheduled Meds: . apixaban  5 mg Oral BID  . aspirin EC  81 mg Oral Daily  . atorvastatin  10 mg Oral QHS  . calcium acetate  667 mg Oral TID  . carvedilol  3.125 mg Oral BID WC  . docusate sodium  100 mg Oral Daily  . fluticasone  1 spray Each Nare Daily  . isosorbide mononitrate  30 mg Oral Daily  . loratadine  10 mg Oral Daily  . midodrine  5 mg Oral BID  . mometasone-formoterol  2 puff Inhalation BID  . multivitamin with minerals  1 tablet Oral Daily  . pantoprazole  20 mg Oral Daily  . pregabalin  75 mg Oral Daily  . sevelamer carbonate  800 mg Oral BID WC  . tiotropium  18 mcg Inhalation Daily   Continuous Infusions: .  ceFAZolin (ANCEF) IV    . cefTAZidime (FORTAZ)  IV Stopped (04/08/17 2045)   PRN Meds:.albuterol, guaifenesin, heparin, heparin, ipratropium-albuterol, nitroGLYCERIN, polyethylene glycol  Current Labs: reviewed  B Cx 11/2 x2: NGTD  Physical Exam:  Blood pressure 101/77, pulse 70, temperature 98.2 F (36.8 C), temperature source Oral, resp. rate 18, height 6\' 2"  (1.88 m), weight 78.7 kg (173 lb 8 oz), SpO2 100 %. On BIPAP, NAD RRR L IJ TDC, 28mm cuff exposed LUE AVF +B/T S/nt/nd   Dialysis Orders: Center: [address]  on [employer] . EDW 82.5HD Bath 1K  Time 3.5 hrs Heparin 2000 bolus 600/hr. Access LIJ TDC and LUE AVF (placed 9/18) BFR 400 DFR 800      A 1. ESRD MWF [place] [facility] follows 2. Hyperkalemia, resolved with  serial HD 3. SOB / Hypervolemia; discrepancy in our weights and outpt records; improved 4. Exposed cuff on TDC -- for exchange today 5. Fever; 11/12 BCx NGTD, rec Vanc/Ceftaz 6. Anemia, not an issue 7. CKD BMPD, P and Ca ok  P 1. Once catheter exchanged, no inpatient renal issues 2. HD tomorrow on schedule, with lower EDW and cont to probe, 78kg post goal, 2K, 2Ca, TDC Qb 400, Tight heparin given procedure  Pearson Grippe MD 04/10/2017, 8:39 AM  Recent Labs  Lab 04/07/17 0941  04/09/17 0018 04/09/17 2030 04/10/17 0503  NA 135   < > 133* 134* 133*  K 5.3*   < > 4.7 4.6 4.6  CL 96*   < > 98* 97* 99*  CO2 24   < > 24 24 21*  GLUCOSE 71   < > 102* 129* 97  BUN 63*   < > 46* 58* 31*  CREATININE 7.57*   < > 7.76* 9.53* 6.66*  CALCIUM 9.5   < > 8.7* 8.8* 9.4  PHOS 4.2  --   --  6.7*  --    < > = values in this interval not displayed.   Recent Labs  Lab 04/07/17 0216 04/07/17 0825 04/09/17 0018 04/09/17 2059  WBC 8.2  --  6.7 9.4  NEUTROABS 5.6  --   --   --   HGB 12.3* 15.3 12.1* 11.8*  HCT 38.5* 45.0 37.4* 36.9*  MCV 82.8  --  82.4 82.2  PLT 156  --  116* 125*

## 2017-04-10 NOTE — Clinical Social Work Note (Signed)
CSW received call by Magda Paganini [phone]) with [place] regarding need for [facility] number. Patient currently has an 'O' PASRR, which is for Assisted Living.   Submitted for SNF PASRR and received number:  [facility] HN#[id number] and PASRR #[id number] A. Magda Paganini called and provided information.  [name], MSW, LCSW Licensed Clinical Social Worker [place] [phone]

## 2017-04-10 NOTE — Progress Notes (Signed)
Occupational Therapy Evaluation Patient Details Name: [name] MRN: [id number] DOB: 11/15/1955 Today's Date: 04/10/2017    History of Present Illness 61 Y/O M with PMX of ESRD on HD MWF, HTN, HLD, COPD, CHF presented with SOB, cough and change in mental status.   Clinical Impression   Pt admitted with/for with SOB and AMS, suspected due to missing HD/?CHF.  Pt notably weak and having difficulty with functional transfers essential for ADL, needing min guard assist for safety at least.  Pt currentlywith decreased independence due to the problems listed below in OT problem list.   Pt will benefit from skilled OT at the SNF level to maximize safety, independence and function in ADL and functional transfers at the wc level.     Follow Up Recommendations  SNF;Supervision/Assistance - 24 hour    Equipment Recommendations  Other (comment)(defer to next venue)    Recommendations for Other Services       Precautions / Restrictions Restrictions Weight Bearing Restrictions: No      Mobility Bed Mobility Overal bed mobility: Needs Assistance Bed Mobility: Sidelying to Sit   Sidelying to sit: Min guard       General bed mobility comments: use of bed rail to assist  Transfers Overall transfer level: Needs assistance   Transfers: Lateral/Scoot Transfers          Lateral/Scoot Transfers: Min assist General transfer comment: pt has stopped doing squat-pivot transfers at some point in the recent past and is now scooting between surfaces without sliding board.  Gave pt min support assist for safety.    Balance Overall balance assessment: Needs assistance Sitting-balance support: No upper extremity supported;Single extremity supported Sitting balance-Leahy Scale: Fair Sitting balance - Comments: unsupported at EOB                                   ADL either performed or assessed with clinical judgement   ADL Overall ADL's : Needs assistance/impaired      Grooming: Set up;Sitting;Wash/dry hands;Wash/dry face Grooming Details (indicate cue type and reason): sitting in recliner Upper Body Bathing: Minimal assistance   Lower Body Bathing: Moderate assistance   Upper Body Dressing : Minimal assistance   Lower Body Dressing: Maximal assistance;Bed level   Toilet Transfer: Min guard;Minimal assistance;BSC;Requires drop arm(lateral scoot) Toilet Transfer Details (indicate cue type and reason): simulated through recliner Toileting- Clothing Manipulation and Hygiene: Moderate assistance       Functional mobility during ADLs: (not attempted this session)       Vision Patient Visual Report: No change from baseline       Perception     Praxis      Pertinent Vitals/Pain       Hand Dominance Right   Extremity/Trunk Assessment Upper Extremity Assessment Upper Extremity Assessment: Generalized weakness   Lower Extremity Assessment RLE Deficits / Details: functional for transfer support, but not strong enough to stand without significant assist LLE Deficits / Details: genralized weakness       Communication Communication Communication: No difficulties   Cognition Arousal/Alertness: Awake/alert Behavior During Therapy: WFL for tasks assessed/performed Overall Cognitive Status: Within Functional Limits for tasks assessed                                     General Comments  Daughter in room for session    Exercises  Shoulder Instructions      Home Living Family/patient expects to be discharged to:: Skilled nursing facility Living Arrangements: Children Available Help at Discharge: Available PRN/intermittently;Family Type of Home: Apartment Home Access: Level entry     Home Layout: One level     Bathroom Shower/Tub: Occupational psychologist: Standard Bathroom Accessibility: Yes   Home Equipment: Environmental consultant - 2 wheels;Wheelchair - manual;Grab bars - toilet;Grab bars - tub/shower           Prior Functioning/Environment Level of Independence: Independent with assistive device(s)        Comments: pt reports has not used prosthesis in about 3 months, has aide that assists with ADL        OT Problem List: Decreased strength;Decreased activity tolerance;Impaired balance (sitting and/or standing);Decreased safety awareness;Decreased knowledge of use of DME or AE;Cardiopulmonary status limiting activity      OT Treatment/Interventions:      OT Goals(Current goals can be found in the care plan section) Acute Rehab OT Goals Patient Stated Goal: live by myself OT Goal Formulation: With patient Time For Goal Achievement: 04/24/17 Potential to Achieve Goals: Fair  OT Frequency:     Barriers to D/C:            Co-evaluation              AM-PAC PT "6 Clicks" Daily Activity     Outcome Measure Help from another person eating meals?: None Help from another person taking care of personal grooming?: A Little Help from another person toileting, which includes using toliet, bedpan, or urinal?: A Little Help from another person bathing (including washing, rinsing, drying)?: A Lot Help from another person to put on and taking off regular upper body clothing?: A Little Help from another person to put on and taking off regular lower body clothing?: A Lot 6 Click Score: 17   End of Session Equipment Utilized During Treatment: Oxygen Nurse Communication: Mobility status  Activity Tolerance: Patient tolerated treatment well Patient left: in chair;with call bell/phone within reach;with family/visitor present  OT Visit Diagnosis: Muscle weakness (generalized) (M62.81);Other symptoms and signs involving cognitive function                Time: 7902-4097 OT Time Calculation (min): 19 min Charges:  OT General Charges $OT Visit: 1 Visit OT Evaluation $OT Eval Moderate Complexity: 1 Mod G-Codes:     Hulda Humphrey OTR/L [place] 04/10/2017, 5:35  PM

## 2017-04-10 NOTE — Procedures (Signed)
Left jugular dialysis catheter needed exchange due to exposed cuff.  New 28 cm Palindrome catheter placed, tip in right atrium.  Patient has a short tunnel and may be at risk for cuff exposure again.  The catheter exit site was sutured in order to prevent cuff migration but patient may need a new tunnel if cuff exposes again.  Catheter is ready to use.

## 2017-04-10 NOTE — Progress Notes (Signed)
OT Cancellation Note  Patient Details Name: ORLAND VISCONTI MRN: [id number] DOB: [birth year]   Cancelled Treatment:    Reason Eval/Treat Not Completed: Patient at procedure or test/ unavailable. Pt off the floor, OT spoke with daughter and will check back for eval as schedule allows.   Merri Ray Shawnelle Spoerl 04/10/2017, 10:32 AM  Hulda Humphrey OTR/L [phone]

## 2017-04-10 NOTE — Progress Notes (Signed)
HD tx ended 15 min early @ 2335 d/t system about to clot off and I would have lost the system and all the blood in it if I had not stopped tx and rinsed back when I did, UF goal not met d/t ending tx early and also d/t having to start tx out at a lower UF goal than ordered d/t pre tx bp being soft, had paged MD on call 2x regarding soft bp on pre assess but pages were not returned, blood rinsed back, VSS, report called Sherri Rad, RN

## 2017-04-10 NOTE — Progress Notes (Signed)
Pt arrived back to unit from IR. Site stable. VSS. Dght at bedside. Call bell and phone within reach. Will continue to monitor.

## 2017-04-10 NOTE — Progress Notes (Signed)
Family Medicine Teaching Service Daily Progress Note Intern Pager: [phone]  Patient name: [name] Medical record number: [id number] Date of birth: 12-18-1955 Age: 60 y.o. Gender: male  Primary Care Provider: Donnie Coffin, MD Consultants: Nephrology Code Status: Full   Pt Overview and Major Events to Date:  11/12 - Emergent dialysis 11/13 - dialysis 11/14 - dialysis 11/15 - planned cath replacement by IR  Assessment and Plan: [name] is a 61 y.o. male with PMH significant for PVD s/p L BKA and right transmetatarsal amputation, ESRD (MWF), HFrEF (EF 20-25%), COPD (3L O2 at home), HTN, CAD presenting with increased shortness of breath and weakness x2 days found to be hyperkalemic to be >7.5 after missing dialysis on 11/9.   Respiratory distress and weakness 2/2 missed HD, h/o COPD Improved. S/p 3 HD sessions 11/12, 11/13, 11/14. VSS. Satting 98-100% on 2L [place]. On 3L O2 [place] at home. On continuous [place] overnight with sats high 90s. No SOB today.  - continuous pulse ox - nephrology following  - q4 neuro checks - continuous cardiac monitoring - continue albuterol PRN, duonebs  Hyperkalemia, resolved S/p HD on 11/12, 11/13. 7.5>>4.6.  Repeat EKG unchanged. - monitor with BMP  ESRD (MWF) Missed HD 04/04/17. Reports that he usually does not miss any sessions. Hyperkalemic >7.5>>4.6, Ca 10.5 and bicarb to 16. S/p 3 HD sessions. Getting catheter replaced today by IR. - nephrology following - f/u renal studies  Hypertension with volume overload, resolved Resolved after 3 HD sessions. - continue coreg, imdur  Exposed catheter cuff, concern for infection Initially had temperature to 99.5 and has exposed cuff. Remains afebrile. Nephrology ordered blood cultures and empirically started on vanc and ceftazidime.  - f/u blood cultures - NGTD x2d - replace catheter today - cont vanc/ceftazidine until cath replacement. - trend temperatures  FEN/GI: Renal diet Prophylaxis:  eliquis  Disposition: SNF pending catheter replacement  Subjective:  Patient feels ok today, agreeable to SNF replacement once discharged.  Objective: Temp:  [97.7 F (36.5 C)-98.9 F (37.2 C)] 98.2 F (36.8 C) (11/15 0758) Pulse Rate:  [64-88] 88 (11/15 0920) Resp:  [9-19] 18 (11/15 0845) BP: (90-146)/(59-83) 96/69 (11/15 0920) SpO2:  [96 %-100 %] 100 % (11/15 0920) Weight:  [173 lb 8 oz (78.7 kg)-178 lb 9.2 oz (81 kg)] 173 lb 8 oz (78.7 kg) (11/14 2342)  Physical Exam: General: NAD, lying in bed Cardiovascular: rrr, no mrg Respiratory: CTAB, on 2L [place]  Abdomen: soft, non tender to palpation, +BS Extremities: right transtarsal amputation, left bka with stocking in place  Laboratory: Recent Labs  Lab 04/07/17 0216 04/07/17 0825 04/09/17 0018 04/09/17 2059  WBC 8.2  --  6.7 9.4  HGB 12.3* 15.3 12.1* 11.8*  HCT 38.5* 45.0 37.4* 36.9*  PLT 156  --  116* 125*   Recent Labs  Lab 04/07/17 0216  04/09/17 0018 04/09/17 2030 04/10/17 0503  NA 135   < > 133* 134* 133*  K >7.5*   < > 4.7 4.6 4.6  CL 103   < > 98* 97* 99*  CO2 16*   < > 24 24 21*  BUN 147*   < > 46* 58* 31*  CREATININE 14.23*   < > 7.76* 9.53* 6.66*  CALCIUM 10.5*   < > 8.7* 8.8* 9.4  PROT 8.2*  --   --   --   --   BILITOT 0.9  --   --   --   --   ALKPHOS 84  --   --   --   --  ALT 22  --   --   --   --   AST 27  --   --   --   --   GLUCOSE 108*   < > 102* 129* 97   < > = values in this interval not displayed.   Imaging/Diagnostic Tests: Dg Chest Port 1 View  Result Date: 04/07/2017 CLINICAL DATA:  Apnea, dialysis dependent renal failure, cardiomyopathy and CHF, COPD, coronary artery disease, current smoker. EXAM: PORTABLE CHEST 1 VIEW COMPARISON:  Portable chest x-ray of April 07, 2017 FINDINGS: The lungs are adequately inflated. There is a small right pleural effusion not greatly changed from the previous study. The cardiac silhouette is enlarged. The pulmonary vascularity is less engorged but  still prominent. The interstitial markings are slightly less prominent overall. There is calcification in the wall of the aortic arch. The dialysis catheter tip projects at the cavoatrial junction. IMPRESSION: CHF. Slight interval decrease in pulmonary edema and pulmonary vascular congestion since earlier today. Persistent small right pleural effusion. Thoracic aortic atherosclerosis. Electronically Signed   By: David  Martinique M.D.   On: 04/07/2017 15:06   Dg Chest Port 1 View  Result Date: 04/07/2017 CLINICAL DATA:  61 year old male with shortness of breath. EXAM: PORTABLE CHEST 1 VIEW COMPARISON:  Chest radiograph dated 03/05/2017 FINDINGS: There is moderate cardiomegaly. Small bilateral pleural effusions with associated atelectatic changes of the lung bases, right greater left. Overall there has been interval progression of the pleural effusions and atelectatic changes of the lungs compared to prior radiograph. There is no pneumothorax. Left-sided dialysis catheter remains in similar position. Right axillary vascular stent noted. No acute osseous pathology. IMPRESSION: Cardiomegaly with progression of vascular congestion and pleural effusions compared to the prior radiograph. Electronically Signed   By: Anner Crete M.D.   On: 04/07/2017 02:26    Rory Percy, DO 04/10/2017, 9:30 AM PGY-1, [place] Intern pager: [phone], text pages welcome

## 2017-04-10 NOTE — Progress Notes (Signed)
Call made to [facility] [employer] [facility] [employer]- spoke with [facility]- regarding order for pt to be assessed and evaluated  for shrinker sock and then provide sock - per conversation they have received order and it is in process. No further order needs at this time.

## 2017-04-10 NOTE — Progress Notes (Signed)
Clinical Social Worker facilitated patient discharge including contacting patient family and facility to confirm patient discharge plans.  Clinical information faxed to facility and family agreeable with plan.  CSW arranged ambulance transport via [facility] to [facility] .CSW reached out to Associate Professor and spoke to [place]. Jasmine stated they have delivered patients shrinker sock and patient has no needs.   RN Helene Kelp to call [phone] (pt will be placed in room 412) for report prior to discharge.  Clinical Social Worker will sign off for now as social work intervention is no longer needed. Please consult [place] again if new need arises.  Rhea Pink, MSW, [place]

## 2017-04-10 NOTE — Progress Notes (Signed)
Report called to Levada Dy at [facility], no further questions. Pt ready for [facility] transportation. Will continue to monitor.

## 2017-04-10 NOTE — Care Management Note (Signed)
Case Management Note Marvetta Gibbons RN, BSN Unit 4E-Case Manager [phone]  Patient Details  Name: [name] MRN: [id number] Date of Birth: 1956/02/06  Subjective/Objective:  Pt admitted with hyperkalemia/AMS- hx ESRD- HD-m/w/f            Action/Plan: PTA pt lived at home- baseline w/c bound- has w/c and RW at home- also has home 02 with [facility] - baseline 3L- was active with [facility] PTA with [facility]- will need resumption order prior to discharge- daughter will bring in portable concentrator for discharge.   Expected Discharge Date:                  Expected Discharge Plan:  [place]  In-House Referral:     Discharge planning Services  CM Consult  Post Acute Care Choice:  Durable Medical Equipment, Home Health, Resumption of Svcs/PTA Provider Choice offered to:  Patient, Adult Children  DME Arranged:  [facility] DME Agency:  [place] Arranged:  RN [facility] Agency:  [place]  Status of Service:  Completed, signed off  If discussed at [place] of Stay Meetings, dates discussed:    Discharge Disposition: skilled facility   Additional Comments:  04/10/17- 0845- Marvetta Gibbons RN, CM - Call made to [facility] [employer] [facility] [employer]- spoke with [facility]- regarding order for pt to be assessed and evaluated  for shrinker sock and then provide sock - per conversation they have received order and it is in process. No further order needs at this time.  [facility] contact #- [phone]  Update:- per CSW pt to go to SNF today- [facility] following for placement needs.   04/09/17- 1700- Marvetta Gibbons RN, CM- per PT notes recommendations for SNF- CSW  To follow up on possible SNF placement   Dahlia Client Romeo Rabon, RN 04/10/2017, 10:56 AM

## 2017-04-10 NOTE — Progress Notes (Signed)
Attempted to call report to [facility]. Placed on hold. Will try again later.

## 2017-04-12 LAB — CULTURE, BLOOD (ROUTINE X 2)
CULTURE: NO GROWTH
Culture: NO GROWTH
SPECIAL REQUESTS: ADEQUATE
Special Requests: ADEQUATE

## 2017-05-29 ENCOUNTER — Ambulatory Visit (INDEPENDENT_AMBULATORY_CARE_PROVIDER_SITE_OTHER): Payer: Medicare Other | Admitting: Vascular Surgery

## 2017-05-29 ENCOUNTER — Encounter (INDEPENDENT_AMBULATORY_CARE_PROVIDER_SITE_OTHER): Payer: Medicare Other

## 2017-06-09 NOTE — Progress Notes (Signed)
06/10/2017 2:42 PM   [name] February 18, 1956 [id number]  Referring provider: Donnie Coffin, MD [place] [place], [place] [address]  Chief Complaint  Patient presents with  . 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  Background history  Patient was referred from [facility] ED for balanitis.  He has had a painful penis for over a month.  He states that he is not circumcised  and it is difficult to draw the foreskin back.  He only had one application of the cream and then lost the tube.  Nothing helps the pain.  Nothing makes the pain worse.  10/10 pain.  He has also noticed some drainage from the penis.  Reviewed referral notes - [name] is a 62 y.o. male who presents with pain in his penis. He notes over the last 2-3 weeks he has had worsening pain and burning starting at the tip of his penis and becoming more proximal. He reports severe pain when he tries to retract the foreskin of his penis. Denies fevers or chills. He has never had this before. No penile discharge. He has no concerns about STDs. He does have a history of end-stage renal disease and is compliant with dialysis. He also has peripheral vascular disease as detailed below [name] is a 63 y.o. male this is at least the second attempt to dictated note on this patient the note was previously dictated as at least one other time in the computer lost it completely. Patient was seen 4 days after initial visit and diagnosis of balanitis which was treated with Doxy and a cream. Patient complains of increasing pain and discharge in his penis. Patient's penis is somewhat swollen there is a discharge she is circumcised but has no phimosis or paraphimosis. There appears to be the possibility of a exophytic lesion at the very tip of his penis. Gram stain of the discharge showed gram-negative diplococci. Patient was given  Rocephin for this. Patient was given referral to urology to follow-up.  At his visit on 03/11/2017, he was diagnosed with penile calciphylaxis and referred to nephrology and the wound clinic.    Today, he states he is urinating well.  He denies any penile pain.  He is not having fevers, chills, nausea or vomiting.   His PVR is 32 cc.     PMH: Past Medical History:  Diagnosis Date  . 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 Amputee, below knee, left ([place])   . Anemia   . Asthma   . Cardiomyopathy ([place])   . CHF (congestive heart failure) ([place])   . Chronic systolic heart failure ([place])   . Complication of anesthesia    hypotension  . COPD (chronic obstructive pulmonary disease) ([place])   . Coronary artery disease   . Dialysis patient ([place])    Mon, Wed, Fri  . End stage renal disease ([place])   . GERD (gastroesophageal reflux disease)   . Headache   . History of kidney stones   . History of pulmonary embolism   . HLD (hyperlipidemia)   . HTN (hypertension)   . Hyperparathyroidism   . Myocardial infarction ([place])   . Peripheral vascular disease ([place])   . Shortness of breath dyspnea   . Sleep apnea    NO C-PAP, Patient stated in process of  "getting one"   .  Tobacco dependence     Surgical History: Past Surgical History:  Procedure Laterality Date  . AMPUTATION Left 05/06/2014   Procedure: AMPUTATION BELOW KNEE;  Surgeon: Elam Dutch, MD;  Location: [facility];  Service: Vascular;  Laterality: Left;  . AMPUTATION Right 01/12/2015   Procedure: Foot transmetatarsal amputation;  Surgeon: Algernon Huxley, MD;  Location: [facility];  Service: Vascular;  Laterality: Right;  . APPLICATION OF WOUND VAC Right 03/01/2015   Procedure: Application of Bio-connekt graft and wound vac application to right foot ;  Surgeon: Algernon Huxley, MD;  Location: [facility];  Service: Vascular;  Laterality: Right;  . AV FISTULA PLACEMENT Left   . AV FISTULA PLACEMENT Left 11/28/2016   Procedure: ARTERIOVENOUS  (AV) FISTULA CREATION;  Surgeon: Algernon Huxley, MD;  Location: [facility];  Service: Vascular;  Laterality: Left;  . CARDIAC CATHETERIZATION     stent placement   . CORONARY ANGIOPLASTY    . DIALYSIS/PERMA CATHETER INSERTION N/A 07/31/2016   Procedure: Dialysis/Perma Catheter Insertion;  Surgeon: Algernon Huxley, MD;  Location: [facility] [facility];  Service: Cardiovascular;  Laterality: N/A;  . IR FLUORO GUIDE CV LINE LEFT  04/10/2017  . LIGATION OF ARTERIOVENOUS  FISTULA Right 01/31/2016   Procedure: LIGATION OF ARTERIOVENOUS  FISTULA;  Surgeon: Algernon Huxley, MD;  Location: [facility];  Service: Vascular;  Laterality: Right;  . PERIPHERAL VASCULAR CATHETERIZATION Right 12/15/2014   Procedure: Lower Extremity Angiography;  Surgeon: Algernon Huxley, MD;  Location: [place] [facility];  Service: Cardiovascular;  Laterality: Right;  . PERIPHERAL VASCULAR CATHETERIZATION  12/15/2014   Procedure: Lower Extremity Intervention;  Surgeon: Algernon Huxley, MD;  Location: [place] [facility];  Service: Cardiovascular;;  . PERIPHERAL VASCULAR CATHETERIZATION Right 08/14/2015   Procedure: A/V Shuntogram/Fistulagram;  Surgeon: Algernon Huxley, MD;  Location: [place] [facility];  Service: Cardiovascular;  Laterality: Right;  . PERIPHERAL VASCULAR CATHETERIZATION N/A 08/14/2015   Procedure: A/V Shunt Intervention;  Surgeon: Algernon Huxley, MD;  Location: [place] [facility];  Service: Cardiovascular;  Laterality: N/A;  . PERIPHERAL VASCULAR CATHETERIZATION N/A 01/11/2016   Procedure: Dialysis/Perma Catheter Insertion;  Surgeon: Algernon Huxley, MD;  Location: [place] [facility];  Service: Cardiovascular;  Laterality: N/A;  . REVISON OF ARTERIOVENOUS FISTULA Right 02/17/2016   Procedure: removal of AV fistula;  Surgeon: Serafina Mitchell, MD;  Location: [facility];  Service: Vascular;  Laterality: Right;  . REVISON OF ARTERIOVENOUS FISTULA Right 01/31/2016   Procedure: REVISON OF ARTERIOVENOUS FISTULA ( BRACHIOCEPHALIC ) W/ ARTEGRAFT;  Surgeon:  Algernon Huxley, MD;  Location: [facility];  Service: Vascular;  Laterality: Right;  . TRANSMETATARSAL AMPUTATION Right 05/04/2015   Procedure: TRANSMETATARSAL AMPUTATION REVISION, great toe amputation;  Surgeon: Algernon Huxley, MD;  Location: [facility];  Service: Vascular;  Laterality: Right;    Home Medications:  Allergies as of 06/10/2017      Reactions   Dust Mite Extract Other (See Comments)   Reaction: unknown      Medication List        Accurate as of 06/10/17  2:42 PM. Always use your most recent med list.          acetaminophen 500 MG tablet Commonly known as:  TYLENOL Take by mouth.   albuterol 108 (90 Base) MCG/ACT inhaler Commonly known as:  PROVENTIL HFA;VENTOLIN HFA Inhale 1-2 puffs every 6 (six) hours as needed into the lungs for wheezing or shortness of breath.   apixaban  5 MG Tabs tablet Commonly known as:  ELIQUIS Take 1 tablet (5 mg total) by mouth 2 (two) times daily.   aspirin EC 325 MG tablet Take by mouth.   atorvastatin 10 MG tablet Commonly known as:  LIPITOR Take 10 mg by mouth at bedtime.   budesonide-formoterol 160-4.5 MCG/ACT inhaler Commonly known as:  SYMBICORT Inhale 2 puffs into the lungs 2 (two) times daily. Reported on 08/14/2015   calcium acetate 667 MG capsule Commonly known as:  PHOSLO Take 667 mg 3 (three) times daily by mouth.   carvedilol 3.125 MG tablet Commonly known as:  COREG Take 1 tablet (3.125 mg total) by mouth 2 (two) times daily with a meal.   cetirizine 10 MG tablet Commonly known as:  ZYRTEC Take 10 mg by mouth daily.   COMBIVENT RESPIMAT 20-100 MCG/ACT Aers respimat Generic drug:  Ipratropium-Albuterol Inhale 1 puff into the lungs every 6 (six) hours as needed for wheezing or shortness of breath.   docusate sodium 100 MG capsule Commonly known as:  COLACE Take by mouth.   fluticasone 50 MCG/ACT nasal spray Commonly known as:  FLONASE Place 1 spray daily into the nose.   isosorbide mononitrate 30 MG 24 hr  tablet Commonly known as:  IMDUR Take by mouth.   midodrine 10 MG tablet Commonly known as:  PROAMATINE Take 5 mg by mouth 2 (two) times daily.   multivitamin with minerals Tabs tablet Take 1 tablet by mouth 2 (two) times daily.   nitroGLYCERIN 0.4 MG SL tablet Commonly known as:  NITROSTAT Place 0.4 mg under the tongue every 5 (five) minutes as needed for chest pain.   pantoprazole 20 MG tablet Commonly known as:  PROTONIX Take 20 mg by mouth daily.   polyethylene glycol packet Commonly known as:  MIRALAX / GLYCOLAX Take by mouth.   pregabalin 75 MG capsule Commonly known as:  LYRICA Take 1 capsule (75 mg total) by mouth daily.   sevelamer carbonate 800 MG tablet Commonly known as:  RENVELA Take 800 mg by mouth 3 (three) times daily with meals.   tiotropium 18 MCG inhalation capsule Commonly known as:  SPIRIVA Place 18 mcg into inhaler and inhale daily.       Allergies:  Allergies  Allergen Reactions  . Dust Mite Extract Other (See Comments)    Reaction: unknown    Family History: Family History  Problem Relation Age of Onset  . Heart failure Other   . Hypertension Other   . Leukemia Other   . Diabetes Other   . Prostate cancer Neg Hx   . Kidney cancer Neg Hx   . Bladder Cancer Neg Hx     Social History:  reports that he has been smoking cigarettes.  He has been smoking about 0.25 packs per day. he has never used smokeless tobacco. He reports that he does not drink alcohol or use drugs.  ROS: UROLOGY Frequent Urination?: No Hard to postpone urination?: No Burning/pain with urination?: No Get up at night to urinate?: No Leakage of urine?: No Urine stream starts and stops?: No Trouble starting stream?: No Do you have to strain to urinate?: No Blood in urine?: No Urinary tract infection?: No Sexually transmitted disease?: No Injury to kidneys or bladder?: No Painful intercourse?: No Weak stream?: No Erection problems?: No Penile pain?:  No  Gastrointestinal Nausea?: No Vomiting?: No Indigestion/heartburn?: No Diarrhea?: No Constipation?: No  Constitutional Fever: No Night sweats?: No Weight loss?: No Fatigue?: No  Skin Skin rash/lesions?:  Yes Itching?: No  Eyes Blurred vision?: No Double vision?: No  Ears/Nose/Throat Sore throat?: No Sinus problems?: No  Hematologic/Lymphatic Swollen glands?: No Easy bruising?: No  Cardiovascular Leg swelling?: No Chest pain?: No  Respiratory Cough?: No Shortness of breath?: Yes  Endocrine Excessive thirst?: No  Musculoskeletal Back pain?: No Joint pain?: No  Neurological Headaches?: No Dizziness?: No  Psychologic Depression?: No Anxiety?: No  Physical Exam: BP (!) 150/82 (BP Location: Left Arm, Patient Position: Sitting, Cuff Size: Normal)   Pulse 71   Ht 6\' 2"  (1.88 m)   BMI 22.28 kg/m   Constitutional: Well nourished. Alert and oriented, No acute distress. HEENT: [place] AT, moist mucus membranes. Trachea midline, no masses. Cardiovascular: No clubbing, cyanosis, or edema. Respiratory: Normal respiratory effort, no increased work of breathing. GI: Abdomen is soft, non tender, non distended, no abdominal masses. Liver and spleen not palpable.  No hernias appreciated.  Stool sample for occult testing is not indicated.   GU: No CVA tenderness.  No bladder fullness or masses.  Patient with uncircumcised phallus. Foreskin easily retracted  Urethral meatus is patent.  Dorsal surface of the glans is concaved due to the history of necrosis, but tissue is healthy.  Vitiligo is present.  Scrotum without lesions, cysts, rashes and/or edema.  Testicles are located scrotally bilaterally. No masses are appreciated in the testicles. Left and right epididymis are normal. Rectal: Deferred.   Skin: No rashes, bruises or suspicious lesions. Lymph: No cervical or inguinal adenopathy. Neurologic: Grossly intact, no focal deficits, moving all 4  extremities. Psychiatric: Normal mood and affect.  Laboratory Data: Lab Results  Component Value Date   WBC 9.4 04/09/2017   HGB 11.8 (L) 04/09/2017   HCT 36.9 (L) 04/09/2017   MCV 82.2 04/09/2017   PLT 125 (L) 04/09/2017    Lab Results  Component Value Date   CREATININE 6.66 (H) 04/10/2017    Lab Results  Component Value Date   HGBA1C 5.1 08/21/2016    Lab Results  Component Value Date   TSH 0.286 (L) 11/11/2016       Component Value Date/Time   CHOL 131 07/26/2016 0904   HDL 45 07/26/2016 0904   CHOLHDL 2.9 07/26/2016 0904   VLDL 39 07/26/2016 0904   LDLCALC 47 07/26/2016 0904    Lab Results  Component Value Date   AST 27 04/07/2017   Lab Results  Component Value Date   ALT 22 04/07/2017   I have reviewed the labs.  Pertinent Imaging Results for [name], [name] (MRN [id number]) as of 06/10/2017 14:42  Ref. Range 06/10/2017 14:41  Scan Result Unknown 32    Assessment & Plan:    1. Penile calciphylaxis  - patient is doing well; penis is well healed  - PVR is 32 cc  Return if symptoms worsen or fail to improve.  These notes generated with voice recognition software. I apologize for typographical errors.  Zara Council, [facility] [address] [place], [place] [address] [phone]

## 2017-06-10 ENCOUNTER — Encounter: Payer: Self-pay | Admitting: Urology

## 2017-06-10 ENCOUNTER — Ambulatory Visit (INDEPENDENT_AMBULATORY_CARE_PROVIDER_SITE_OTHER): Payer: Medicare Other | Admitting: Urology

## 2017-06-10 LAB — BLADDER SCAN AMB NON-IMAGING: Scan Result: 32

## 2017-06-22 ENCOUNTER — Inpatient Hospital Stay
Admission: EM | Admit: 2017-06-22 | Discharge: 2017-06-23 | DRG: 640 | Disposition: A | Discharge status: Nursing Home (Includes ICF) | Payer: Medicare Other | Attending: Internal Medicine | Admitting: Internal Medicine

## 2017-06-22 ENCOUNTER — Emergency Department: Payer: Medicare Other

## 2017-06-22 ENCOUNTER — Encounter: Payer: Self-pay | Admitting: Emergency Medicine

## 2017-06-22 ENCOUNTER — Other Ambulatory Visit: Payer: Self-pay

## 2017-06-22 DIAGNOSIS — N186 End stage renal disease: Secondary | ICD-10-CM | POA: Diagnosis not present

## 2017-06-22 DIAGNOSIS — I2510 Atherosclerotic heart disease of native coronary artery without angina pectoris: Secondary | ICD-10-CM | POA: Diagnosis present

## 2017-06-22 DIAGNOSIS — Z79899 Other long term (current) drug therapy: Secondary | ICD-10-CM

## 2017-06-22 DIAGNOSIS — F17210 Nicotine dependence, cigarettes, uncomplicated: Secondary | ICD-10-CM | POA: Diagnosis present

## 2017-06-22 DIAGNOSIS — Z7901 Long term (current) use of anticoagulants: Secondary | ICD-10-CM

## 2017-06-22 DIAGNOSIS — Z86718 Personal history of other venous thrombosis and embolism: Secondary | ICD-10-CM

## 2017-06-22 DIAGNOSIS — I132 Hypertensive heart and chronic kidney disease with heart failure and with stage 5 chronic kidney disease, or end stage renal disease: Secondary | ICD-10-CM | POA: Diagnosis present

## 2017-06-22 DIAGNOSIS — Z992 Dependence on renal dialysis: Secondary | ICD-10-CM

## 2017-06-22 DIAGNOSIS — Z87442 Personal history of urinary calculi: Secondary | ICD-10-CM

## 2017-06-22 DIAGNOSIS — K219 Gastro-esophageal reflux disease without esophagitis: Secondary | ICD-10-CM | POA: Diagnosis present

## 2017-06-22 DIAGNOSIS — I252 Old myocardial infarction: Secondary | ICD-10-CM

## 2017-06-22 DIAGNOSIS — G4730 Sleep apnea, unspecified: Secondary | ICD-10-CM | POA: Diagnosis present

## 2017-06-22 DIAGNOSIS — Z86711 Personal history of pulmonary embolism: Secondary | ICD-10-CM | POA: Diagnosis not present

## 2017-06-22 DIAGNOSIS — Z89512 Acquired absence of left leg below knee: Secondary | ICD-10-CM | POA: Diagnosis not present

## 2017-06-22 DIAGNOSIS — J9601 Acute respiratory failure with hypoxia: Secondary | ICD-10-CM | POA: Diagnosis present

## 2017-06-22 DIAGNOSIS — Z7982 Long term (current) use of aspirin: Secondary | ICD-10-CM

## 2017-06-22 DIAGNOSIS — D631 Anemia in chronic kidney disease: Secondary | ICD-10-CM | POA: Diagnosis present

## 2017-06-22 DIAGNOSIS — Z955 Presence of coronary angioplasty implant and graft: Secondary | ICD-10-CM | POA: Diagnosis not present

## 2017-06-22 DIAGNOSIS — E8770 Fluid overload, unspecified: Secondary | ICD-10-CM | POA: Diagnosis not present

## 2017-06-22 DIAGNOSIS — I739 Peripheral vascular disease, unspecified: Secondary | ICD-10-CM | POA: Diagnosis not present

## 2017-06-22 DIAGNOSIS — J441 Chronic obstructive pulmonary disease with (acute) exacerbation: Secondary | ICD-10-CM

## 2017-06-22 DIAGNOSIS — E8779 Other fluid overload: Secondary | ICD-10-CM

## 2017-06-22 DIAGNOSIS — N2581 Secondary hyperparathyroidism of renal origin: Secondary | ICD-10-CM | POA: Diagnosis present

## 2017-06-22 DIAGNOSIS — E785 Hyperlipidemia, unspecified: Secondary | ICD-10-CM | POA: Diagnosis not present

## 2017-06-22 DIAGNOSIS — I429 Cardiomyopathy, unspecified: Secondary | ICD-10-CM | POA: Diagnosis present

## 2017-06-22 DIAGNOSIS — I5023 Acute on chronic systolic (congestive) heart failure: Secondary | ICD-10-CM | POA: Diagnosis not present

## 2017-06-22 DIAGNOSIS — E875 Hyperkalemia: Secondary | ICD-10-CM | POA: Diagnosis not present

## 2017-06-22 DIAGNOSIS — G629 Polyneuropathy, unspecified: Secondary | ICD-10-CM | POA: Diagnosis present

## 2017-06-22 LAB — CBC WITH DIFFERENTIAL/PLATELET
BASOS PCT: 1 %
Basophils Absolute: 0.1 10*3/uL (ref 0–0.1)
EOS ABS: 0.2 10*3/uL (ref 0–0.7)
Eosinophils Relative: 2 %
HCT: 42.9 % (ref 40.0–52.0)
HEMOGLOBIN: 13.5 g/dL (ref 13.0–18.0)
Lymphocytes Relative: 22 %
Lymphs Abs: 1.7 10*3/uL (ref 1.0–3.6)
MCH: 27.8 pg (ref 26.0–34.0)
MCHC: 31.4 g/dL — AB (ref 32.0–36.0)
MCV: 88.5 fL (ref 80.0–100.0)
MONOS PCT: 20 %
Monocytes Absolute: 1.5 10*3/uL — ABNORMAL HIGH (ref 0.2–1.0)
NEUTROS PCT: 55 %
Neutro Abs: 4.2 10*3/uL (ref 1.4–6.5)
Platelets: 111 10*3/uL — ABNORMAL LOW (ref 150–440)
RBC: 4.84 MIL/uL (ref 4.40–5.90)
RDW: 20.6 % — ABNORMAL HIGH (ref 11.5–14.5)
WBC: 7.6 10*3/uL (ref 3.8–10.6)

## 2017-06-22 LAB — INFLUENZA PANEL BY PCR (TYPE A & B)
INFLBPCR: NEGATIVE
Influenza A By PCR: NEGATIVE

## 2017-06-22 LAB — BASIC METABOLIC PANEL
Anion gap: 12 (ref 5–15)
BUN: 86 mg/dL — ABNORMAL HIGH (ref 6–20)
CO2: 21 mmol/L — ABNORMAL LOW (ref 22–32)
CREATININE: 11.03 mg/dL — AB (ref 0.61–1.24)
Calcium: 9.3 mg/dL (ref 8.9–10.3)
Chloride: 102 mmol/L (ref 101–111)
GFR, EST AFRICAN AMERICAN: 5 mL/min — AB (ref >60)
GFR, EST NON AFRICAN AMERICAN: 4 mL/min — AB (ref >60)
Glucose, Bld: 87 mg/dL (ref 65–99)
Potassium: 7 mmol/L (ref 3.5–5.1)
SODIUM: 135 mmol/L (ref 135–145)

## 2017-06-22 LAB — GLUCOSE, CAPILLARY
Glucose-Capillary: 82 mg/dL (ref 65–99)
Glucose-Capillary: 98 mg/dL (ref 65–99)

## 2017-06-22 MED ORDER — ACETAMINOPHEN 650 MG RE SUPP: 650.0000 mg | Freq: Four times a day (QID) | RECTAL | Status: DC | PRN

## 2017-06-22 MED ORDER — INSULIN ASPART 100 UNIT/ML ~~LOC~~ SOLN
SUBCUTANEOUS | Status: AC
  Administered 2017-06-22: 10 [IU] via INTRAVENOUS
  Filled 2017-06-22: qty 1

## 2017-06-22 MED ORDER — ALTEPLASE 2 MG IJ SOLR: 2.0000 mg | Freq: Once | INTRAMUSCULAR | Status: DC | PRN

## 2017-06-22 MED ORDER — PANTOPRAZOLE SODIUM 40 MG PO TBEC
40.0000 mg | DELAYED_RELEASE_TABLET | Freq: Every day | ORAL | Status: DC
  Administered 2017-06-23: 40 mg via ORAL
  Filled 2017-06-22: qty 1

## 2017-06-22 MED ORDER — ACETAMINOPHEN 325 MG PO TABS: 650.0000 mg | ORAL_TABLET | Freq: Four times a day (QID) | ORAL | Status: DC | PRN

## 2017-06-22 MED ORDER — ALBUTEROL SULFATE (2.5 MG/3ML) 0.083% IN NEBU
INHALATION_SOLUTION | RESPIRATORY_TRACT | Status: AC
  Administered 2017-06-22: 5 mg
  Filled 2017-06-22: qty 6

## 2017-06-22 MED ORDER — ADULT MULTIVITAMIN W/MINERALS CH
1.0000 | ORAL_TABLET | Freq: Two times a day (BID) | ORAL | Status: DC
  Administered 2017-06-23: 1 via ORAL
  Filled 2017-06-22: qty 1

## 2017-06-22 MED ORDER — DEXTROSE 50 % IV SOLN
1.0000 | Freq: Once | INTRAVENOUS | Status: AC
  Administered 2017-06-22: 50 mL via INTRAVENOUS
  Filled 2017-06-22: qty 50

## 2017-06-22 MED ORDER — ATORVASTATIN CALCIUM 10 MG PO TABS: 10.0000 mg | ORAL_TABLET | Freq: Every day | ORAL | Status: DC

## 2017-06-22 MED ORDER — APIXABAN 5 MG PO TABS
5.0000 mg | ORAL_TABLET | Freq: Two times a day (BID) | ORAL | Status: DC
  Administered 2017-06-23: 5 mg via ORAL
  Filled 2017-06-22: qty 1

## 2017-06-22 MED ORDER — HEPARIN SODIUM (PORCINE) 1000 UNIT/ML DIALYSIS: 1000.0000 [IU] | INTRAMUSCULAR | Status: DC | PRN

## 2017-06-22 MED ORDER — MOMETASONE FURO-FORMOTEROL FUM 200-5 MCG/ACT IN AERO
2.0000 | INHALATION_SPRAY | Freq: Two times a day (BID) | RESPIRATORY_TRACT | Status: DC
  Administered 2017-06-22 – 2017-06-23 (×2): 2 via RESPIRATORY_TRACT
  Filled 2017-06-22: qty 8.8

## 2017-06-22 MED ORDER — MIDODRINE HCL 5 MG PO TABS
5.0000 mg | ORAL_TABLET | Freq: Two times a day (BID) | ORAL | Status: DC
  Administered 2017-06-23: 5 mg via ORAL
  Filled 2017-06-22: qty 1

## 2017-06-22 MED ORDER — IPRATROPIUM-ALBUTEROL 20-100 MCG/ACT IN AERS: 1.0000 | INHALATION_SPRAY | Freq: Four times a day (QID) | RESPIRATORY_TRACT | Status: DC | PRN

## 2017-06-22 MED ORDER — PANTOPRAZOLE SODIUM 20 MG PO TBEC: 20.0000 mg | DELAYED_RELEASE_TABLET | Freq: Every day | ORAL | Status: DC

## 2017-06-22 MED ORDER — ISOSORBIDE MONONITRATE ER 30 MG PO TB24
30.0000 mg | ORAL_TABLET | Freq: Every day | ORAL | Status: DC
  Administered 2017-06-23: 30 mg via ORAL
  Filled 2017-06-22: qty 1

## 2017-06-22 MED ORDER — SODIUM CHLORIDE 0.9 % IV SOLN: 100.0000 mL | INTRAVENOUS | Status: DC | PRN

## 2017-06-22 MED ORDER — IPRATROPIUM-ALBUTEROL 0.5-2.5 (3) MG/3ML IN SOLN: 3.0000 mL | Freq: Four times a day (QID) | RESPIRATORY_TRACT | Status: DC | PRN

## 2017-06-22 MED ORDER — LIDOCAINE-PRILOCAINE 2.5-2.5 % EX CREA
1.0000 "application " | TOPICAL_CREAM | CUTANEOUS | Status: DC | PRN
  Filled 2017-06-22: qty 5

## 2017-06-22 MED ORDER — PREGABALIN 75 MG PO CAPS
150.0000 mg | ORAL_CAPSULE | Freq: Two times a day (BID) | ORAL | Status: DC
  Administered 2017-06-23: 150 mg via ORAL
  Filled 2017-06-22: qty 2

## 2017-06-22 MED ORDER — INSULIN ASPART 100 UNIT/ML IV SOLN
10.0000 [IU] | Freq: Once | INTRAVENOUS | Status: AC
  Administered 2017-06-22: 10 [IU] via INTRAVENOUS
  Filled 2017-06-22: qty 0.1

## 2017-06-22 MED ORDER — TIOTROPIUM BROMIDE MONOHYDRATE 18 MCG IN CAPS
18.0000 ug | ORAL_CAPSULE | Freq: Every day | RESPIRATORY_TRACT | Status: DC
  Administered 2017-06-23: 18 ug via RESPIRATORY_TRACT
  Filled 2017-06-22: qty 5

## 2017-06-22 MED ORDER — SODIUM BICARBONATE 8.4 % IV SOLN
50.0000 meq | Freq: Once | INTRAVENOUS | Status: AC
  Administered 2017-06-22: 50 meq via INTRAVENOUS
  Filled 2017-06-22: qty 50

## 2017-06-22 MED ORDER — METHYLPREDNISOLONE SODIUM SUCC 125 MG IJ SOLR
125.0000 mg | Freq: Once | INTRAMUSCULAR | Status: AC
Start: 2017-06-22 — End: 2017-06-22
  Administered 2017-06-22: 125 mg via INTRAVENOUS
  Filled 2017-06-22: qty 2

## 2017-06-22 MED ORDER — CALCIUM ACETATE (PHOS BINDER) 667 MG PO CAPS
667.0000 mg | ORAL_CAPSULE | Freq: Three times a day (TID) | ORAL | Status: DC
  Administered 2017-06-23: 667 mg via ORAL
  Filled 2017-06-22: qty 1

## 2017-06-22 MED ORDER — PENTAFLUOROPROP-TETRAFLUOROETH EX AERO
1.0000 "application " | INHALATION_SPRAY | CUTANEOUS | Status: DC | PRN
  Filled 2017-06-22: qty 30

## 2017-06-22 MED ORDER — ONDANSETRON HCL 4 MG/2ML IJ SOLN: 4.0000 mg | Freq: Four times a day (QID) | INTRAMUSCULAR | Status: DC | PRN

## 2017-06-22 MED ORDER — ALBUTEROL SULFATE (2.5 MG/3ML) 0.083% IN NEBU: 3.0000 mL | INHALATION_SOLUTION | Freq: Four times a day (QID) | RESPIRATORY_TRACT | Status: DC | PRN

## 2017-06-22 MED ORDER — NITROGLYCERIN 0.4 MG SL SUBL: 0.4000 mg | SUBLINGUAL_TABLET | SUBLINGUAL | Status: DC | PRN

## 2017-06-22 MED ORDER — IPRATROPIUM-ALBUTEROL 0.5-2.5 (3) MG/3ML IN SOLN
3.0000 mL | Freq: Once | RESPIRATORY_TRACT | Status: AC
  Administered 2017-06-22: 3 mL via RESPIRATORY_TRACT
  Filled 2017-06-22: qty 3

## 2017-06-22 MED ORDER — LORATADINE 10 MG PO TABS
10.0000 mg | ORAL_TABLET | Freq: Every day | ORAL | Status: DC
  Administered 2017-06-23: 10 mg via ORAL
  Filled 2017-06-22: qty 1

## 2017-06-22 MED ORDER — SEVELAMER CARBONATE 800 MG PO TABS
800.0000 mg | ORAL_TABLET | Freq: Three times a day (TID) | ORAL | Status: DC
  Administered 2017-06-23 (×2): 800 mg via ORAL
  Filled 2017-06-22 (×2): qty 1

## 2017-06-22 MED ORDER — LIDOCAINE HCL (PF) 1 % IJ SOLN: 5.0000 mL | INTRAMUSCULAR | Status: DC | PRN

## 2017-06-22 MED ORDER — SODIUM POLYSTYRENE SULFONATE 15 GM/60ML PO SUSP
30.0000 g | Freq: Once | ORAL | Status: AC
  Administered 2017-06-22: 30 g via ORAL
  Filled 2017-06-22: qty 120

## 2017-06-22 MED ORDER — ALBUTEROL SULFATE (2.5 MG/3ML) 0.083% IN NEBU
5.0000 mg | INHALATION_SOLUTION | Freq: Once | RESPIRATORY_TRACT | Status: DC
  Filled 2017-06-22: qty 6

## 2017-06-22 MED ORDER — CARVEDILOL 3.125 MG PO TABS
3.1250 mg | ORAL_TABLET | Freq: Two times a day (BID) | ORAL | Status: DC
  Administered 2017-06-23: 3.125 mg via ORAL
  Filled 2017-06-22: qty 1

## 2017-06-22 MED ORDER — POLYETHYLENE GLYCOL 3350 17 G PO PACK: 17.0000 g | PACK | Freq: Every day | ORAL | Status: DC | PRN

## 2017-06-22 MED ORDER — ONDANSETRON HCL 4 MG PO TABS: 4.0000 mg | ORAL_TABLET | Freq: Four times a day (QID) | ORAL | Status: DC | PRN

## 2017-06-22 NOTE — H&P (Signed)
[facility] NAME: [name]    MR#:  [id number]  DATE OF BIRTH:  August 08, 1955  DATE OF ADMISSION:  06/22/2017  PRIMARY CARE PHYSICIAN: Donnie Coffin, MD   REQUESTING/REFERRING PHYSICIAN: Dr. Carrie Mew  CHIEF COMPLAINT:   Chief Complaint  Patient presents with  . Shortness of Breath    HISTORY OF PRESENT ILLNESS:  [name]  is a 62 y.o. male with a known history of end-stage renal disease on hemodialysis, chronic systolic CHF, COPD, peripheral vascular disease status post left below-the-knee amputation, hypertension, history of DVT/PE, GERD, secondary hyperparathyroidism who presents to the hospital due to shortness of breath cough. Patient says is he was in his usual state of health until this morning he started developing worsening shortness of breath and cough. He admits to some chills but no documented fever. The cough has been productive with clear sputum. He denies any paroxysmal nocturnal dyspnea or orthopnea. He did finish his dialysis this past Friday and was feeling fine until this morning when he developed the above symptoms. Patient presented to the emergency room was noted to be volume overloaded and also noted to be acutely hyperkalemic and hospitalist services were contacted further treatment and evaluation.  PAST MEDICAL HISTORY:   Past Medical History:  Diagnosis Date  . 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 Amputee, below knee, left ([place])   . Anemia   . Asthma   . Cardiomyopathy ([place])   . CHF (congestive heart failure) ([place])   . Chronic systolic heart failure ([place])   . Complication of anesthesia    hypotension  . COPD (chronic obstructive pulmonary disease) ([place])   . Coronary artery disease   . Dialysis patient ([place])    Mon, Wed, Fri  . End stage renal disease ([place])   . GERD (gastroesophageal reflux disease)   . Headache   . History of kidney stones   . History of pulmonary  embolism   . HLD (hyperlipidemia)   . HTN (hypertension)   . Hyperparathyroidism   . Myocardial infarction ([place])   . Peripheral vascular disease ([place])   . Shortness of breath dyspnea   . Sleep apnea    NO C-PAP, Patient stated in process of  "getting one"   . Tobacco dependence     PAST SURGICAL HISTORY:   Past Surgical History:  Procedure Laterality Date  . AMPUTATION Left 05/06/2014   Procedure: AMPUTATION BELOW KNEE;  Surgeon: Elam Dutch, MD;  Location: [facility];  Service: Vascular;  Laterality: Left;  . AMPUTATION Right 01/12/2015   Procedure: Foot transmetatarsal amputation;  Surgeon: Algernon Huxley, MD;  Location: [facility];  Service: Vascular;  Laterality: Right;  . APPLICATION OF WOUND VAC Right 03/01/2015   Procedure: Application of Bio-connekt graft and wound vac application to right foot ;  Surgeon: Algernon Huxley, MD;  Location: [facility];  Service: Vascular;  Laterality: Right;  . AV FISTULA PLACEMENT Left   . AV FISTULA PLACEMENT Left 11/28/2016   Procedure: ARTERIOVENOUS (AV) FISTULA CREATION;  Surgeon: Algernon Huxley, MD;  Location: [facility];  Service: Vascular;  Laterality: Left;  . CARDIAC CATHETERIZATION     stent placement   . CORONARY ANGIOPLASTY    . DIALYSIS/PERMA CATHETER INSERTION N/A 07/31/2016   Procedure: Dialysis/Perma Catheter Insertion;  Surgeon: Algernon Huxley, MD;  Location: [facility];  Service: Cardiovascular;  Laterality: N/A;  . IR FLUORO GUIDE  CV LINE LEFT  04/10/2017  . LIGATION OF ARTERIOVENOUS  FISTULA Right 01/31/2016   Procedure: LIGATION OF ARTERIOVENOUS  FISTULA;  Surgeon: Algernon Huxley, MD;  Location: [facility];  Service: Vascular;  Laterality: Right;  . PERIPHERAL VASCULAR CATHETERIZATION Right 12/15/2014   Procedure: Lower Extremity Angiography;  Surgeon: Algernon Huxley, MD;  Location: [facility];  Service: Cardiovascular;  Laterality: Right;  . PERIPHERAL VASCULAR CATHETERIZATION  12/15/2014   Procedure: Lower Extremity Intervention;   Surgeon: Algernon Huxley, MD;  Location: [facility];  Service: Cardiovascular;;  . PERIPHERAL VASCULAR CATHETERIZATION Right 08/14/2015   Procedure: A/V Shuntogram/Fistulagram;  Surgeon: Algernon Huxley, MD;  Location: [facility];  Service: Cardiovascular;  Laterality: Right;  . PERIPHERAL VASCULAR CATHETERIZATION N/A 08/14/2015   Procedure: A/V Shunt Intervention;  Surgeon: Algernon Huxley, MD;  Location: [facility];  Service: Cardiovascular;  Laterality: N/A;  . PERIPHERAL VASCULAR CATHETERIZATION N/A 01/11/2016   Procedure: Dialysis/Perma Catheter Insertion;  Surgeon: Algernon Huxley, MD;  Location: [place];  Service: Cardiovascular;  Laterality: N/A;  . REVISON OF ARTERIOVENOUS FISTULA Right 02/17/2016   Procedure: removal of AV fistula;  Surgeon: Serafina Mitchell, MD;  Location: [facility];  Service: Vascular;  Laterality: Right;  . REVISON OF ARTERIOVENOUS FISTULA Right 01/31/2016   Procedure: REVISON OF ARTERIOVENOUS FISTULA ( BRACHIOCEPHALIC ) W/ ARTEGRAFT;  Surgeon: Algernon Huxley, MD;  Location: [facility];  Service: Vascular;  Laterality: Right;  . TRANSMETATARSAL AMPUTATION Right 05/04/2015   Procedure: TRANSMETATARSAL AMPUTATION REVISION, great toe amputation;  Surgeon: Algernon Huxley, MD;  Location: [facility];  Service: Vascular;  Laterality: Right;    SOCIAL HISTORY:   Social History   Tobacco Use  . Smoking status: Light Tobacco Smoker    Packs/day: 0.25    Years: 30.00    Pack years: 7.50    Types: Cigarettes  . Smokeless tobacco: Never Used  . Tobacco comment: 5  Substance Use Topics  . Alcohol use: No    Alcohol/week: 0.0 oz    FAMILY HISTORY:   Family History  Problem Relation Age of Onset  . Leukemia Mother   . Heart attack Father   . Heart failure Other   . Hypertension Other   . Leukemia Other   . Diabetes Other   . Prostate cancer Neg Hx   . Kidney cancer Neg Hx   . Bladder Cancer Neg Hx     DRUG ALLERGIES:   Allergies  Allergen  Reactions  . Dust Mite Extract Other (See Comments)    Reaction: unknown    REVIEW OF SYSTEMS:   Review of Systems  Constitutional: Negative for fever and weight loss.  HENT: Negative for congestion, nosebleeds and tinnitus.   Eyes: Negative for blurred vision, double vision and redness.  Respiratory: Positive for cough and shortness of breath. Negative for hemoptysis.   Cardiovascular: Negative for chest pain, orthopnea, leg swelling and PND.  Gastrointestinal: Negative for abdominal pain, diarrhea, melena, nausea and vomiting.  Genitourinary: Negative for dysuria, hematuria and urgency.  Musculoskeletal: Negative for falls and joint pain.  Neurological: Negative for dizziness, tingling, sensory change, focal weakness, seizures, weakness and headaches.  Endo/Heme/Allergies: Negative for polydipsia. Does not bruise/bleed easily.  Psychiatric/Behavioral: Negative for depression and memory loss. The patient is not nervous/anxious.     MEDICATIONS AT HOME:   Prior to Admission medications   Medication Sig Start Date End Date Taking? Authorizing Provider  acetaminophen (TYLENOL)  500 MG tablet Take by mouth.    [provider]  albuterol (PROVENTIL HFA;VENTOLIN HFA) 108 (90 Base) MCG/ACT inhaler Inhale 1-2 puffs every 6 (six) hours as needed into the lungs for wheezing or shortness of breath.    [provider]  apixaban (ELIQUIS) 5 MG TABS tablet Take 1 tablet (5 mg total) by mouth 2 (two) times daily. 01/20/17   Gladstone Lighter, MD  aspirin EC 325 MG tablet Take by mouth.    [provider]  atorvastatin (LIPITOR) 10 MG tablet Take 10 mg by mouth at bedtime.     [provider]  budesonide-formoterol (SYMBICORT) 160-4.5 MCG/ACT inhaler Inhale 2 puffs into the lungs 2 (two) times daily. Reported on 08/14/2015    [provider]  calcium acetate (PHOSLO) 667 MG capsule Take 667 mg 3 (three) times daily by mouth.     [provider]   carvedilol (COREG) 3.125 MG tablet Take 1 tablet (3.125 mg total) by mouth 2 (two) times daily with a meal. 01/20/17   Gladstone Lighter, MD  cetirizine (ZYRTEC) 10 MG tablet Take 10 mg by mouth daily.    [provider]  docusate sodium (COLACE) 100 MG capsule Take by mouth.    [provider]  fluticasone (FLONASE) 50 MCG/ACT nasal spray Place 1 spray daily into the nose.     [provider]  Ipratropium-Albuterol (COMBIVENT RESPIMAT) 20-100 MCG/ACT AERS respimat Inhale 1 puff into the lungs every 6 (six) hours as needed for wheezing or shortness of breath.    [provider]  isosorbide mononitrate (IMDUR) 30 MG 24 hr tablet Take by mouth. 08/22/16   [provider]  midodrine (PROAMATINE) 10 MG tablet Take 5 mg by mouth 2 (two) times daily.    [provider]  Multiple Vitamin (MULTIVITAMIN WITH MINERALS) TABS tablet Take 1 tablet by mouth 2 (two) times daily.    [provider]  nitroGLYCERIN (NITROSTAT) 0.4 MG SL tablet Place 0.4 mg under the tongue every 5 (five) minutes as needed for chest pain.    [provider]  pantoprazole (PROTONIX) 20 MG tablet Take 20 mg by mouth daily.    [provider]  polyethylene glycol (MIRALAX / GLYCOLAX) packet Take by mouth.    [provider]  pregabalin (LYRICA) 75 MG capsule Take 1 capsule (75 mg total) by mouth daily. Patient taking differently: Take 150 mg 2 (two) times daily by mouth.  01/22/17   Gladstone Lighter, MD  sevelamer carbonate (RENVELA) 800 MG tablet Take 800 mg by mouth 3 (three) times daily with meals.     [provider]  tiotropium (SPIRIVA) 18 MCG inhalation capsule Place 18 mcg into inhaler and inhale daily.    [provider]      VITAL SIGNS:  Blood pressure 131/89, pulse 84, temperature 97.9 F (36.6 C), temperature source Oral, resp. rate (!) 21, height 6\' 2"  (1.88 m), weight 84.4 kg (186 lb), SpO2 98 %.  PHYSICAL  EXAMINATION:  Physical Exam  GENERAL:  62 y.o.-year-old patient lying in the bed in mild Resp. Distress.  EYES: Pupils equal, round, reactive to light and accommodation. No scleral icterus. Extraocular muscles intact.  HEENT: Head atraumatic, normocephalic. Oropharynx and nasopharynx clear. No oropharyngeal erythema, moist oral mucosa  NECK:  Supple, no jugular venous distention. No thyroid enlargement, no tenderness.  LUNGS: Normal breath sounds bilaterally, no wheezing, Bibasilar rales,  No rhonchi. No use of accessory muscles of respiration.  CARDIOVASCULAR: S1, S2  RRR. No murmurs, rubs, gallops, clicks.  ABDOMEN: Soft, nontender, nondistended. Bowel sounds present. No organomegaly or mass.  EXTREMITIES: No pedal edema, cyanosis, or clubbing. + 2 pedal & radial pulses b/l.  Left AKA NEUROLOGIC: Cranial nerves II through XII are intact. No focal Motor or sensory deficits appreciated b/l. Globally weak.  PSYCHIATRIC: The patient is alert and oriented x 3.  SKIN: No obvious rash, lesion, or ulcer.   LABORATORY PANEL:   CBC Recent Labs  Lab 06/22/17 1814  WBC 7.6  HGB 13.5  HCT 42.9  PLT 111*   ------------------------------------------------------------------------------------------------------------------  Chemistries  Recent Labs  Lab 06/22/17 1814  NA 135  K 7.0*  CL 102  CO2 21*  GLUCOSE 87  BUN 86*  CREATININE 11.03*  CALCIUM 9.3   ------------------------------------------------------------------------------------------------------------------  Cardiac Enzymes No results for input(s): TROPONINI in the last 168 hours. ------------------------------------------------------------------------------------------------------------------  RADIOLOGY:  Dg Chest 2 View  Result Date: 06/22/2017 CLINICAL DATA:  Patient with shortness of breath. Nonproductive cough. EXAM: CHEST  2 VIEW COMPARISON:  Chest radiograph 04/07/2017 FINDINGS: Central venous catheter tip projects  over the right atrium. Monitoring leads overlie the patient. Stable marked cardiomegaly. Aortic atherosclerosis. Small right pleural effusion. Pulmonary vascular redistribution. Mid lower lung heterogeneous pulmonary opacities. Thoracic spine degenerative changes. IMPRESSION: Cardiomegaly with mild interstitial opacities favored to represent edema. Small right pleural effusion. Electronically Signed   By: Lovey Newcomer M.D.   On: 06/22/2017 17:54     IMPRESSION AND PLAN:   62 year old male with past medical history of hypertension, hyperlipidemia, history of PE, end-stage renal disease on hemodialysis, COPD, chronic systolic CHF, peripheral vascular disease status post left below the knee amputation who presents to the hospital due to cough, shortness of breath.  1. Acute respiratory failure with hypoxia-secondary to volume overload and CHF. -Patient did get urgent hemodialysis. Continue O2 supplementation and wean off as tolerated.  2. CHF-acute on chronic systolic dysfunction. -Patient to get urgent hemodialysis for fluid removal. Follow volume status post dialysis.  3. Hyperkalemia-secondary to patient's end-stage renal disease. Patient did receive some calcium gluconate and Kayexalate in the ER. Nephrology has been consulted and plan for urgent hemodialysis tonight. -No acute EKG changes, will keep on telemetry until potassium corrects.  4. COPD-no acute exacerbation. Continue Symbicort, Spiriva, Combivent, DuoNeb's as needed.  5. Secondary hyperparathyroidism-continue Renvela, PhosLo.  6. Peripheral neuropathy-continue Lyrica.   7. Hyperlipidemia - cont. Atorvastatin.   8 history of DVT/pulmonary embolism- cont. Eliquis  9. Essential hypertension-continue imdur, carvedilol.   All the records are reviewed and case discussed with ED provider. Management plans discussed with the patient, family and they are in agreement.  CODE STATUS: Full code  TOTAL TIME TAKING CARE OF THIS  PATIENT: 45 minutes.    Henreitta Leber M.D on 06/22/2017 at 8:44 PM  Between 7am to 6pm - Pager - [phone]  After 6pm go to [email or web address] - password [email or web address] [facility]  [facility]  Office  [phone]  CC: Primary care physician; Donnie Coffin, MD

## 2017-06-22 NOTE — ED Triage Notes (Signed)
Pt arrived via EMS from [place]. Pt reports SOB for the past 2 weeks, but worse today.  Pt has had a non productive cough and green nasal drainage. Pt also c/o sinus pain above left eye.  Pt wears oxygen 2L PRN and has been wearing continuously.  Per EMS initial room air sat was 86%, pt placed on 3L [place] up to 98-100%.  Prior hx of COPD, Asthma, ESRD, HTN and DM.  Pt last had HD on Friday.    Pt also c/o shoulder pain since getting sick and sore throat when swallowing. Pt c/o dry throat.

## 2017-06-22 NOTE — Progress Notes (Signed)
We were called by the emergency department regarding hyperkalemia in the patient.  Dialysis nurse has been contacted in she is on her way to perform dialysis urgently on the patient.  Further plan as patient progresses.

## 2017-06-22 NOTE — ED Notes (Signed)
CRITICAL LAB: POTASSIUM is 7.0, [employer], Dr. Joni Fears notified, orders received

## 2017-06-22 NOTE — ED Notes (Signed)
Report finished, transporting to floor att

## 2017-06-22 NOTE — ED Provider Notes (Addendum)
[facility] Emergency Department Provider Note  ____________________________________________  Time seen: Approximately 7:56 PM  I have reviewed the triage vital signs and the nursing notes.   HISTORY  Chief Complaint Shortness of Breath    HPI [name] is a 62 y.o. male who complains of worsening shortness of breath for the past 2 weeks, much worse today. He has worsening shortness of breath when laying flat and does feel much more comfortable sitting upright. He also has a nonproductive cough and chills and body aches. Denies chest pain. Shortness of breath is constant, worse with exertion, no alleviating factors. Severe. He does have when necessary oxygen at home, but today he's been using 2 L continuously. EMS reported a room air oxygen saturation of 86%.  Patient is hemodialysis dependent, secondary anuria. He's been compliant with his dialysis although sometimes he has to have his session stopped early because of hypotension. Friday he was able to complete full session.     Past Medical History:  Diagnosis Date  . 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 Amputee, below knee, left ([place])   . Anemia   . Asthma   . Cardiomyopathy ([place])   . CHF (congestive heart failure) ([place])   . Chronic systolic heart failure ([place])   . Complication of anesthesia    hypotension  . COPD (chronic obstructive pulmonary disease) ([place])   . Coronary artery disease   . Dialysis patient ([place])    Mon, Wed, Fri  . End stage renal disease ([place])   . GERD (gastroesophageal reflux disease)   . Headache   . History of kidney stones   . History of pulmonary embolism   . HLD (hyperlipidemia)   . HTN (hypertension)   . Hyperparathyroidism   . Myocardial infarction ([place])   . Peripheral vascular disease ([place])   . Shortness of breath dyspnea   . Sleep apnea    NO C-PAP, Patient stated in process of  "getting one"   . Tobacco dependence      Patient Active  Problem List   Diagnosis Date Noted  . Apnea   . End-stage renal disease on hemodialysis ([place])   . Hypervolemia   . Respiratory distress   . Altered mental status   . Acute on chronic systolic CHF (congestive heart failure) ([place]) 01/18/2017  . Respiratory failure with hypoxia ([place]) 11/11/2016  . Acute respiratory failure ([place]) 10/28/2016  . Pulmonary embolism ([place]) 08/03/2016  . NSTEMI (non-ST elevated myocardial infarction) ([place]) 07/26/2016  . Acute respiratory failure with hypoxia ([place]) 05/24/2016  . Right lower lobe pneumonia ([place]) 05/24/2016  . Elevated troponin 05/24/2016  . Pulmonary edema 05/21/2016  . Kidney dialysis as the cause of abnormal reaction of the patient, or of later complication, without mention of misadventure at the time of the procedure (CODE) 03/05/2016  . PVD (peripheral vascular disease) ([place]) 03/05/2016  . Preop examination 05/02/2015  . Chronic systolic heart failure ([place])   . Sepsis ([place]) 01/13/2015  . Acute encephalopathy 01/13/2015  . Hyperkalemia 01/13/2015  . Gangrene of foot ([place]) 05/04/2014  . ESRD (end stage renal disease) on dialysis ([place]) 05/04/2014  . CAD (coronary artery disease) 05/04/2014  . Normocytic anemia 05/04/2014  . Hyperlipidemia 04/26/2010  . HYPERTENSION, BENIGN 04/26/2010  . CAD, NATIVE VESSEL 04/26/2010     Past Surgical History:  Procedure Laterality Date  . AMPUTATION Left 05/06/2014   Procedure: AMPUTATION BELOW KNEE;  Surgeon: Elam Dutch, MD;  Location: [facility]  OR;  Service: Vascular;  Laterality: Left;  . AMPUTATION Right 01/12/2015   Procedure: Foot transmetatarsal amputation;  Surgeon: Algernon Huxley, MD;  Location: [facility] ORS;  Service: Vascular;  Laterality: Right;  . APPLICATION OF WOUND VAC Right 03/01/2015   Procedure: Application of Bio-connekt graft and wound vac application to right foot ;  Surgeon: Algernon Huxley, MD;  Location: [facility] ORS;  Service: Vascular;  Laterality: Right;  . AV FISTULA PLACEMENT Left   . AV  FISTULA PLACEMENT Left 11/28/2016   Procedure: ARTERIOVENOUS (AV) FISTULA CREATION;  Surgeon: Algernon Huxley, MD;  Location: [facility] ORS;  Service: Vascular;  Laterality: Left;  . CARDIAC CATHETERIZATION     stent placement   . CORONARY ANGIOPLASTY    . DIALYSIS/PERMA CATHETER INSERTION N/A 07/31/2016   Procedure: Dialysis/Perma Catheter Insertion;  Surgeon: Algernon Huxley, MD;  Location: [place] CV LAB;  Service: Cardiovascular;  Laterality: N/A;  . IR FLUORO GUIDE CV LINE LEFT  04/10/2017  . LIGATION OF ARTERIOVENOUS  FISTULA Right 01/31/2016   Procedure: LIGATION OF ARTERIOVENOUS  FISTULA;  Surgeon: Algernon Huxley, MD;  Location: [facility] ORS;  Service: Vascular;  Laterality: Right;  . PERIPHERAL VASCULAR CATHETERIZATION Right 12/15/2014   Procedure: Lower Extremity Angiography;  Surgeon: Algernon Huxley, MD;  Location: [place] CV LAB;  Service: Cardiovascular;  Laterality: Right;  . PERIPHERAL VASCULAR CATHETERIZATION  12/15/2014   Procedure: Lower Extremity Intervention;  Surgeon: Algernon Huxley, MD;  Location: [place] CV LAB;  Service: Cardiovascular;;  . PERIPHERAL VASCULAR CATHETERIZATION Right 08/14/2015   Procedure: A/V Shuntogram/Fistulagram;  Surgeon: Algernon Huxley, MD;  Location: [place] CV LAB;  Service: Cardiovascular;  Laterality: Right;  . PERIPHERAL VASCULAR CATHETERIZATION N/A 08/14/2015   Procedure: A/V Shunt Intervention;  Surgeon: Algernon Huxley, MD;  Location: [name] CV LAB;  Service: Cardiovascular;  Laterality: N/A;  . PERIPHERAL VASCULAR CATHETERIZATION N/A 01/11/2016   Procedure: Dialysis/Perma Catheter Insertion;  Surgeon: Algernon Huxley, MD;  Location: [place] CV LAB;  Service: Cardiovascular;  Laterality: N/A;  . REVISON OF ARTERIOVENOUS FISTULA Right 02/17/2016   Procedure: removal of AV fistula;  Surgeon: Serafina Mitchell, MD;  Location: [facility] ORS;  Service: Vascular;  Laterality: Right;  . REVISON OF ARTERIOVENOUS FISTULA Right 01/31/2016   Procedure: REVISON OF  ARTERIOVENOUS FISTULA ( BRACHIOCEPHALIC ) W/ ARTEGRAFT;  Surgeon: Algernon Huxley, MD;  Location: [facility] ORS;  Service: Vascular;  Laterality: Right;  . TRANSMETATARSAL AMPUTATION Right 05/04/2015   Procedure: TRANSMETATARSAL AMPUTATION REVISION, great toe amputation;  Surgeon: Algernon Huxley, MD;  Location: [facility] ORS;  Service: Vascular;  Laterality: Right;     Prior to Admission medications   Medication Sig Start Date End Date Taking? Authorizing Provider  acetaminophen (TYLENOL) 500 MG tablet Take by mouth.    [provider]  albuterol (PROVENTIL HFA;VENTOLIN HFA) 108 (90 Base) MCG/ACT inhaler Inhale 1-2 puffs every 6 (six) hours as needed into the lungs for wheezing or shortness of breath.    [provider]  apixaban (ELIQUIS) 5 MG TABS tablet Take 1 tablet (5 mg total) by mouth 2 (two) times daily. 01/20/17   Gladstone Lighter, MD  aspirin EC 325 MG tablet Take by mouth.    [provider]  atorvastatin (LIPITOR) 10 MG tablet Take 10 mg by mouth at bedtime.     [provider]  budesonide-formoterol (SYMBICORT) 160-4.5 MCG/ACT inhaler Inhale 2 puffs into the lungs 2 (two) times daily. Reported on 08/14/2015  [provider]  calcium acetate (PHOSLO) 667 MG capsule Take 667 mg 3 (three) times daily by mouth.     [provider]  carvedilol (COREG) 3.125 MG tablet Take 1 tablet (3.125 mg total) by mouth 2 (two) times daily with a meal. 01/20/17   Gladstone Lighter, MD  cetirizine (ZYRTEC) 10 MG tablet Take 10 mg by mouth daily.    [provider]  docusate sodium (COLACE) 100 MG capsule Take by mouth.    [provider]  fluticasone (FLONASE) 50 MCG/ACT nasal spray Place 1 spray daily into the nose.     [provider]  Ipratropium-Albuterol (COMBIVENT RESPIMAT) 20-100 MCG/ACT AERS respimat Inhale 1 puff into the lungs every 6 (six) hours as needed for wheezing or shortness of breath.    [provider]   isosorbide mononitrate (IMDUR) 30 MG 24 hr tablet Take by mouth. 08/22/16   [provider]  midodrine (PROAMATINE) 10 MG tablet Take 5 mg by mouth 2 (two) times daily.    [provider]  Multiple Vitamin (MULTIVITAMIN WITH MINERALS) TABS tablet Take 1 tablet by mouth 2 (two) times daily.    [provider]  nitroGLYCERIN (NITROSTAT) 0.4 MG SL tablet Place 0.4 mg under the tongue every 5 (five) minutes as needed for chest pain.    [provider]  pantoprazole (PROTONIX) 20 MG tablet Take 20 mg by mouth daily.    [provider]  polyethylene glycol (MIRALAX / GLYCOLAX) packet Take by mouth.    [provider]  pregabalin (LYRICA) 75 MG capsule Take 1 capsule (75 mg total) by mouth daily. Patient taking differently: Take 150 mg 2 (two) times daily by mouth.  01/22/17   Gladstone Lighter, MD  sevelamer carbonate (RENVELA) 800 MG tablet Take 800 mg by mouth 3 (three) times daily with meals.     [provider]  tiotropium (SPIRIVA) 18 MCG inhalation capsule Place 18 mcg into inhaler and inhale daily.    [provider]     Allergies Dust mite extract   Family History  Problem Relation Age of Onset  . Heart failure Other   . Hypertension Other   . Leukemia Other   . Diabetes Other   . Prostate cancer Neg Hx   . Kidney cancer Neg Hx   . Bladder Cancer Neg Hx     Social History Social History   Tobacco Use  . Smoking status: Light Tobacco Smoker    Packs/day: 0.25    Types: Cigarettes  . Smokeless tobacco: Never Used  . Tobacco comment: 5  Substance Use Topics  . Alcohol use: No    Alcohol/week: 0.0 oz  . Drug use: No    Comment: has used crack cocaine in past     Review of Systems  Constitutional:   No fever positive chills.  ENT:   No sore throat. No rhinorrhea. Cardiovascular:   No chest pain or syncope. Respiratory:   Positive shortness of breath and cough cough. Gastrointestinal:   Negative for  abdominal pain, vomiting and diarrhea.  Musculoskeletal:   Negative for focal pain or swelling All other systems reviewed and are negative except as documented above in ROS and HPI.  ____________________________________________   PHYSICAL EXAM:  VITAL SIGNS: ED Triage Vitals  Enc Vitals Group     BP 06/22/17 1709 (!) 144/108     Pulse Rate 06/22/17 1709 78     Resp 06/22/17 1709 (!) 27     Temp  06/22/17 1709 97.9 F (36.6 C)     Temp Source 06/22/17 1709 Oral     SpO2 06/22/17 1704 (!) 86 %     Weight 06/22/17 1710 186 lb (84.4 kg)     Height 06/22/17 1710 6\' 2"  (1.88 m)     Head Circumference --      Peak Flow --      Pain Score 06/22/17 1710 4     Pain Loc --      Pain Edu? --      Excl. in [place]? --     Vital signs reviewed, nursing assessments reviewed.   Constitutional:   Alert and oriented. Ill appearing, not in distress Eyes:   No scleral icterus.  EOMI. No nystagmus. No conjunctival pallor. PERRL. ENT   Head:   Normocephalic and atraumatic.   Nose:   No congestion/rhinnorhea.    Mouth/Throat:   MMM, no pharyngeal erythema. No peritonsillar mass.    Neck:   No meningismus. Full ROM. Hematological/Lymphatic/Immunilogical:   No cervical lymphadenopathy. Cardiovascular:   RRR. Symmetric bilateral radial and DP pulses.  No murmurs.  Respiratory:   Normal respiratory effort but with tachypnea. Diffuse expiratory wheezing. Breath sounds are clear and equal bilaterally. No crackles. Gastrointestinal:   Soft without focal tenderness. Non distended. There is no CVA tenderness.  No rebound, rigidity, or guarding. Genitourinary:   deferred Musculoskeletal:   Normal range of motion in all extremities. No joint effusions.  No lower extremity tenderness.  No edema. Neurologic:   Normal speech and language.  Motor grossly intact. No acute focal neurologic deficits are appreciated.  Skin:    Skin is warm, dry and intact. No rash noted.  No petechiae, purpura, or  bullae.  ____________________________________________    LABS (pertinent positives/negatives) (all labs ordered are listed, but only abnormal results are displayed) Labs Reviewed  BASIC METABOLIC PANEL - Abnormal; Notable for the following components:      Result Value   Potassium 7.0 (*)    CO2 21 (*)    BUN 86 (*)    Creatinine, Ser 11.03 (*)    GFR calc non Af Amer 4 (*)    GFR calc Af Amer 5 (*)    All other components within normal limits  CBC WITH DIFFERENTIAL/PLATELET - Abnormal; Notable for the following components:   MCHC 31.4 (*)    RDW 20.6 (*)    Platelets 111 (*)    Monocytes Absolute 1.5 (*)    All other components within normal limits  INFLUENZA PANEL BY PCR (TYPE A & B)   ____________________________________________   EKG  Interpreted by me Sinus rhythm rate of 78, normal axis and intervals. Left bundle branch block. No acute ischemic changes. T-wave inversions in the lateral leads which is unchanged compared to 04/08/2017.  ____________________________________________    RADIOLOGY  Dg Chest 2 View  Result Date: 06/22/2017 CLINICAL DATA:  Patient with shortness of breath. Nonproductive cough. EXAM: CHEST  2 VIEW COMPARISON:  Chest radiograph 04/07/2017 FINDINGS: Central venous catheter tip projects over the right atrium. Monitoring leads overlie the patient. Stable marked cardiomegaly. Aortic atherosclerosis. Small right pleural effusion. Pulmonary vascular redistribution. Mid lower lung heterogeneous pulmonary opacities. Thoracic spine degenerative changes. IMPRESSION: Cardiomegaly with mild interstitial opacities favored to represent edema. Small right pleural effusion. Electronically Signed   By: Lovey Newcomer M.D.   On: 06/22/2017 17:54    ____________________________________________   PROCEDURES .Critical Care Performed by: Carrie Mew, MD Authorized by: Carrie Mew, MD  Critical care provider statement:    Critical care time  (minutes):  35   Critical care time was exclusive of:  Separately billable procedures and treating other patients   Critical care was necessary to treat or prevent imminent or life-threatening deterioration of the following conditions:  Circulatory failure, cardiac failure and respiratory failure   Critical care was time spent personally by me on the following activities:  Development of treatment plan with patient or surrogate, discussions with consultants, evaluation of patient's response to treatment, examination of patient, obtaining history from patient or surrogate, ordering and performing treatments and interventions, ordering and review of laboratory studies, ordering and review of radiographic studies, pulse oximetry, re-evaluation of patient's condition and review of old charts    ____________________________________________    CLINICAL IMPRESSION / [place] / ED COURSE  Pertinent labs & imaging results that were available during my care of the patient were reviewed by me and considered in my medical decision making (see chart for details).     Clinical Course as of Jun 22 2016  Nancy Fetter Jun 22, 2017  [place] Patient presents with shortness of breath, nonproductive cough, possible influenza and COPD exacerbation. Symptoms and chest x-ray also suggest a degree of volume overload despite compliance with his hemodialysis. With his acute respiratory failure with hypoxia, patient plan for hospitalization for further management.  [PS]  1925 Potassium 7.0. No EKG changes. Normal cardiac rhythm area we'll treat with insulin and glucose bicarbonate calcium. Already receiving large doses of albuterol. Kayexalate. Continue plan for admission. Will require monitoring until he can be dialyzed.  [PS]  2018 Discussed with nephrology Dr. Holley Raring, will  plan for dialysis tonight.  [PS]    Clinical Course User Index [PS] Carrie Mew, MD      ____________________________________________   FINAL CLINICAL IMPRESSION(S) / ED DIAGNOSES    Final diagnoses:  COPD exacerbation ([place])  Acute respiratory failure with hypoxia ([place])  Hyperkalemia  Other hypervolemia  End-stage renal disease on hemodialysis [facility])       Portions of this note were generated with dragon dictation software. Dictation errors may occur despite best attempts at proofreading.    Carrie Mew, MD 06/22/17 2010    Carrie Mew, MD 06/22/17 2018

## 2017-06-22 NOTE — ED Notes (Signed)
Meal tray given 

## 2017-06-23 DIAGNOSIS — E875 Hyperkalemia: Secondary | ICD-10-CM | POA: Diagnosis not present

## 2017-06-23 LAB — COMPREHENSIVE METABOLIC PANEL
ALT: 31 U/L (ref 17–63)
AST: 35 U/L (ref 15–41)
Albumin: 3.5 g/dL (ref 3.5–5.0)
Alkaline Phosphatase: 104 U/L (ref 38–126)
Anion gap: 15 (ref 5–15)
BUN: 43 mg/dL — ABNORMAL HIGH (ref 6–20)
CO2: 25 mmol/L (ref 22–32)
Calcium: 9.1 mg/dL (ref 8.9–10.3)
Chloride: 96 mmol/L — ABNORMAL LOW (ref 101–111)
Creatinine, Ser: 6.23 mg/dL — ABNORMAL HIGH (ref 0.61–1.24)
GFR calc Af Amer: 10 mL/min — ABNORMAL LOW (ref >60)
GFR calc non Af Amer: 9 mL/min — ABNORMAL LOW (ref >60)
Glucose, Bld: 132 mg/dL — ABNORMAL HIGH (ref 65–99)
Potassium: 4.3 mmol/L (ref 3.5–5.1)
Sodium: 136 mmol/L (ref 135–145)
Total Bilirubin: 1 mg/dL (ref 0.3–1.2)
Total Protein: 8.9 g/dL — ABNORMAL HIGH (ref 6.5–8.1)

## 2017-06-23 LAB — CBC
HCT: 37.5 % — ABNORMAL LOW (ref 40.0–52.0)
HCT: 41.7 % (ref 40.0–52.0)
Hemoglobin: 12.2 g/dL — ABNORMAL LOW (ref 13.0–18.0)
Hemoglobin: 13.2 g/dL (ref 13.0–18.0)
MCH: 28.5 pg (ref 26.0–34.0)
MCH: 27.8 pg (ref 26.0–34.0)
MCHC: 32.4 g/dL (ref 32.0–36.0)
MCHC: 31.6 g/dL — ABNORMAL LOW (ref 32.0–36.0)
MCV: 88 fL (ref 80.0–100.0)
MCV: 87.7 fL (ref 80.0–100.0)
PLATELETS: 117 10*3/uL — AB (ref 150–440)
Platelets: 118 10*3/uL — ABNORMAL LOW (ref 150–440)
RBC: 4.26 MIL/uL — ABNORMAL LOW (ref 4.40–5.90)
RBC: 4.75 MIL/uL (ref 4.40–5.90)
RDW: 21.1 % — ABNORMAL HIGH (ref 11.5–14.5)
RDW: 20.7 % — ABNORMAL HIGH (ref 11.5–14.5)
WBC: 5.5 10*3/uL (ref 3.8–10.6)
WBC: 5.7 10*3/uL (ref 3.8–10.6)

## 2017-06-23 LAB — BASIC METABOLIC PANEL
Anion gap: 13 (ref 5–15)
BUN: 87 mg/dL — ABNORMAL HIGH (ref 6–20)
CALCIUM: 8.7 mg/dL — AB (ref 8.9–10.3)
CO2: 23 mmol/L (ref 22–32)
CREATININE: 11.48 mg/dL — AB (ref 0.61–1.24)
Chloride: 100 mmol/L — ABNORMAL LOW (ref 101–111)
GFR calc non Af Amer: 4 mL/min — ABNORMAL LOW (ref >60)
GFR, EST AFRICAN AMERICAN: 5 mL/min — AB (ref >60)
Glucose, Bld: 137 mg/dL — ABNORMAL HIGH (ref 65–99)
Potassium: 6.2 mmol/L — ABNORMAL HIGH (ref 3.5–5.1)
Sodium: 136 mmol/L (ref 135–145)

## 2017-06-23 LAB — MRSA PCR SCREENING: MRSA BY PCR: NEGATIVE

## 2017-06-23 LAB — PHOSPHORUS: Phosphorus: 4.7 mg/dL — ABNORMAL HIGH (ref 2.5–4.6)

## 2017-06-23 NOTE — Clinical Social Work Note (Signed)
Patient to be d/c'ed today to [facility].  Patient and family agreeable to plans will transport via family vehicle RN to call report.  Evette Cristal, MSW, [place]

## 2017-06-23 NOTE — Progress Notes (Signed)
[facility]  ROUNDING NOTE   Subjective:   [name] admitted to [facility] on 06/22/2017 for Hyperkalemia [E87.5] COPD exacerbation ([place]) [J44.1] End-stage renal disease on hemodialysis ([place]) [N18.6, Z99.2] Acute respiratory failure with hypoxia ([place]) [J96.01] Other hypervolemia [E87.79]  Emergent hemodialysis last night. Potassium 7 on admission. UF 3 liters.   Claims did not get good treatment on Friday.   Objective:  Vital signs in last 24 hours:  Temp:  [97.4 F (36.3 C)-98.2 F (36.8 C)] 97.4 F (36.3 C) (01/28 0956) Pulse Rate:  [60-84] 65 (01/28 0956) Resp:  [10-29] 18 (01/28 0956) BP: (127-181)/(68-113) 128/105 (01/28 0956) SpO2:  [86 %-100 %] 98 % (01/28 1007) Weight:  [84.4 kg (186 lb)-92.1 kg (203 lb 0.7 oz)] 87.6 kg (193 lb 3.2 oz) (01/28 0509)  Weight change:  Filed Weights   06/23/17 0050 06/23/17 0445 06/23/17 0509  Weight: 92.1 kg (203 lb 0.7 oz) 88.4 kg (194 lb 14.2 oz) 87.6 kg (193 lb 3.2 oz)    Intake/Output: I/O last 3 completed shifts: In: -  Out: 3023 [BWGYK:5993]   Intake/Output this shift:  Total I/O In: 240 [P.O.:240] Out: -   Physical Exam: General: NAD, laying in bed  Head: Normocephalic, atraumatic. Moist oral mucosal membranes  Eyes: Anicteric, PERRL  Neck: Supple, trachea midline  Lungs:  Clear to auscultation  Heart: Regular rate and rhythm  Abdomen:  Soft, nontender,   Extremities: no peripheral edema. Left BKA  Neurologic: Nonfocal, moving all four extremities  Skin: No lesions  Access: LIJ permcath, left AVF    Basic Metabolic Panel: Recent Labs  Lab 06/22/17 1814 06/23/17 0419 06/23/17 0520  NA 135 136 136  K 7.0* 6.2* 4.3  CL 102 100* 96*  CO2 21* 23 25  GLUCOSE 87 137* 132*  BUN 86* 87* 43*  CREATININE 11.03* 11.48* 6.23*  CALCIUM 9.3 8.7* 9.1  PHOS  --  4.7*  --     Liver Function Tests: Recent Labs  Lab 06/23/17 0520  AST 35  ALT 31  ALKPHOS 104  BILITOT 1.0  PROT 8.9*  ALBUMIN  3.5   No results for input(s): LIPASE, AMYLASE in the last 168 hours. No results for input(s): AMMONIA in the last 168 hours.  CBC: Recent Labs  Lab 06/22/17 1814 06/23/17 0419 06/23/17 0520  WBC 7.6 5.5 5.7  NEUTROABS 4.2  --   --   HGB 13.5 12.2* 13.2  HCT 42.9 37.5* 41.7  MCV 88.5 88.0 87.7  PLT 111* 117* 118*    Cardiac Enzymes: No results for input(s): CKTOTAL, CKMB, CKMBINDEX, TROPONINI in the last 168 hours.  BNP: Invalid input(s): POCBNP  CBG: Recent Labs  Lab 06/22/17 1958 06/22/17 2115  GLUCAP 82 98    Microbiology: Results for orders placed or performed during the hospital encounter of 06/22/17  MRSA PCR Screening     Status: None   Collection Time: 06/22/17 10:57 PM  Result Value Ref Range Status   MRSA by PCR NEGATIVE NEGATIVE Final    Comment:        The GeneXpert MRSA Assay (FDA approved for NASAL specimens only), is one component of a comprehensive MRSA colonization surveillance program. It is not intended to diagnose MRSA infection nor to guide or monitor treatment for MRSA infections. Performed at [facility], [address] [place],  [address]     Coagulation Studies: No results for input(s): LABPROT, INR in the last 72 hours.  Urinalysis: No results for  input(s): COLORURINE, LABSPEC, PHURINE, GLUCOSEU, HGBUR, BILIRUBINUR, KETONESUR, PROTEINUR, UROBILINOGEN, NITRITE, LEUKOCYTESUR in the last 72 hours.  Invalid input(s): APPERANCEUR    Imaging: Dg Chest 2 View  Result Date: 06/22/2017 CLINICAL DATA:  Patient with shortness of breath. Nonproductive cough. EXAM: CHEST  2 VIEW COMPARISON:  Chest radiograph 04/07/2017 FINDINGS: Central venous catheter tip projects over the right atrium. Monitoring leads overlie the patient. Stable marked cardiomegaly. Aortic atherosclerosis. Small right pleural effusion. Pulmonary vascular redistribution. Mid lower lung heterogeneous pulmonary opacities. Thoracic spine degenerative  changes. IMPRESSION: Cardiomegaly with mild interstitial opacities favored to represent edema. Small right pleural effusion. Electronically Signed   By: Lovey Newcomer M.D.   On: 06/22/2017 17:54     Medications:    . albuterol  5 mg Nebulization Once  . apixaban  5 mg Oral BID  . atorvastatin  10 mg Oral QHS  . calcium acetate  667 mg Oral TID  . carvedilol  3.125 mg Oral BID WC  . isosorbide mononitrate  30 mg Oral Daily  . loratadine  10 mg Oral Daily  . midodrine  5 mg Oral BID  . mometasone-formoterol  2 puff Inhalation BID  . multivitamin with minerals  1 tablet Oral BID  . pantoprazole  40 mg Oral Daily  . pregabalin  150 mg Oral BID  . sevelamer carbonate  800 mg Oral TID WC  . tiotropium  18 mcg Inhalation Daily   acetaminophen **OR** acetaminophen, albuterol, ipratropium-albuterol, nitroGLYCERIN, ondansetron **OR** ondansetron (ZOFRAN) IV, polyethylene glycol  Assessment/ Plan:  [name] is a 62 y.o. black male with ESRD on hemodialysis, hypertension, hyperlipidemia, anemia, COPD, peripheral vascular disease, left BKA, right toe amputations  [facility].   1. End stage renal disease with hyperkalemia on admission. Emergent hemodialysis last night. No indication for dialysis today.  - Low K diet - Volume restriction  2. Anemia of chronic kidney disease: hemoglobin 13.2 - EPO as outpatient.   3. Hypertension: blood pressure elevated, volume related. Carvedilol and isosorbide mononitrate ordered.  - midodrine before dialysis.   4. Secondary Hyperparathyroidism: - calcium acetate and sevelamer with meals.     LOS: 1 [name] 1/28/201910:08 AM

## 2017-06-23 NOTE — Progress Notes (Addendum)
Patient given discharge instructions. Pt verbalized understanding with no further questions. IV taken out and tele monitor taken off. Patient 98% on room air. Patient only wears oxygen as needed and will be transporting on room air. Pt will be transported to [facility] by family member.

## 2017-06-23 NOTE — Plan of Care (Signed)
  Adequate for Discharge Health Behavior/Discharge Planning: Ability to manage health-related needs will improve 06/23/2017 1351 - Adequate for Discharge by Feliberto Gottron, RN Clinical Measurements: Ability to maintain clinical measurements within normal limits will improve 06/23/2017 1351 - Adequate for Discharge by Chriss Czar, Illene Bolus, RN Will remain free from infection 06/23/2017 1351 - Adequate for Discharge by Feliberto Gottron, RN Diagnostic test results will improve 06/23/2017 1351 - Adequate for Discharge by Feliberto Gottron, RN Respiratory complications will improve 06/23/2017 1351 - Adequate for Discharge by Feliberto Gottron, RN Cardiovascular complication will be avoided 06/23/2017 1351 - Adequate for Discharge by Feliberto Gottron, RN Pain Managment: General experience of comfort will improve 06/23/2017 1351 - Adequate for Discharge by Feliberto Gottron, RN Safety: Ability to remain free from injury will improve 06/23/2017 1351 - Adequate for Discharge by Feliberto Gottron, RN Activity: Ability to tolerate increased activity will improve 06/23/2017 1351 - Adequate for Discharge by Feliberto Gottron, RN Education: Knowledge of disease and its progression will improve 06/23/2017 1351 - Adequate for Discharge by Feliberto Gottron, RN Coping: Development of coping mechanisms to deal with changes in body function or appearance will improve 06/23/2017 1351 - Adequate for Discharge by Chriss Czar, Illene Bolus, RN Fluid Volume: Compliance with measures to maintain balanced fluid volume will improve 06/23/2017 1351 - Adequate for Discharge by Feliberto Gottron, [place] Behavior/Discharge Planning: Ability to manage health-related needs will improve 06/23/2017 1351 - Adequate for Discharge by Feliberto Gottron, RN Nutritional: Ability to make healthy dietary choices will improve 06/23/2017 1351 -  Adequate for Discharge by Feliberto Gottron, RN Physical Regulation: Complications related to the disease process, condition or treatment will be avoided or minimized 06/23/2017 1351 - Adequate for Discharge by Feliberto Gottron, RN

## 2017-06-23 NOTE — Progress Notes (Signed)
Post HD assessment  

## 2017-06-23 NOTE — Progress Notes (Signed)
Pre HD assessment  

## 2017-06-23 NOTE — Discharge Summary (Signed)
[place] at [facility], [place] y.o., DOB [birth year], MRN [id number]. Admission date: 06/22/2017 Discharge Date 06/23/2017 Primary MD Donnie Coffin, MD Admitting Physician Henreitta Leber, MD  Admission Diagnosis  Hyperkalemia [E87.5] COPD exacerbation [facility]) [J44.1] End-stage renal disease on hemodialysis ([place]) [N18.6, Z99.2] Acute respiratory failure with hypoxia ([place]) [J96.01] Other hypervolemia [E87.79]  Discharge Diagnosis   Active Problems: Acute respiratory failure with hypoxia Acute on chronic systolic CHF Hyperkalemia COPD without exasperation Secondary hyperparathyroidism Peripheral neuropathy Hyperlipidemia History of DVT/pulmonary embolism Essential hypertension Sleep apnea patient needs again follow-up outpatient      Hospital Course  62 year old male with past medical history of hypertension, hyperlipidemia, history of PE, end-stage renal disease on hemodialysis, COPD, chronic systolic CHF, peripheral vascular disease status post left below the knee amputation who presents to the hospital due to cough, shortness of breath.  Patient was noted to have acute CHF and hyperkalemia.  Patient was urgently dialyzed.  He is feeling much better his breathing is back to baseline.  He does have history of requiring intermittent oxygen.  Patient is stable for discharge needs to follow-up with nephrology for further outpatient dialysis.              Consults  nephrology  Significant Tests:  See full reports for all details    Dg Chest 2 View  Result Date: 06/22/2017 CLINICAL DATA:  Patient with shortness of breath. Nonproductive cough. EXAM: CHEST  2 VIEW COMPARISON:  Chest radiograph 04/07/2017 FINDINGS: Central venous catheter tip projects over the right atrium. Monitoring leads overlie the patient. Stable marked cardiomegaly. Aortic atherosclerosis. Small right pleural effusion. Pulmonary vascular redistribution. Mid lower  lung heterogeneous pulmonary opacities. Thoracic spine degenerative changes. IMPRESSION: Cardiomegaly with mild interstitial opacities favored to represent edema. Small right pleural effusion. Electronically Signed   By: Lovey Newcomer M.D.   On: 06/22/2017 17:54       Today   Subjective:   [name] patient breathing much better states that he is feeling back to baseline Objective:   Blood pressure (!) 128/105, pulse 65, temperature (!) 97.4 F (36.3 C), temperature source Oral, resp. rate 18, height 6\' 2"  (1.88 m), weight 193 lb 3.2 oz (87.6 kg), SpO2 98 %.  .  Intake/Output Summary (Last 24 hours) at 06/23/2017 1043 Last data filed at 06/23/2017 0954 Gross per 24 hour  Intake 240 ml  Output 3023 ml  Net -2783 ml    Exam VITAL SIGNS: Blood pressure (!) 128/105, pulse 65, temperature (!) 97.4 F (36.3 C), temperature source Oral, resp. rate 18, height 6\' 2"  (1.88 m), weight 193 lb 3.2 oz (87.6 kg), SpO2 98 %.  GENERAL:  62 y.o.-year-old patient lying in the bed with no acute distress.  EYES: Pupils equal, round, reactive to light and accommodation. No scleral icterus. Extraocular muscles intact.  HEENT: Head atraumatic, normocephalic. Oropharynx and nasopharynx clear.  NECK:  Supple, no jugular venous distention. No thyroid enlargement, no tenderness.  LUNGS: Normal breath sounds bilaterally, no wheezing, rales,rhonchi or crepitation. No use of accessory muscles of respiration.  CARDIOVASCULAR: S1, S2 normal. No murmurs, rubs, or gallops.  ABDOMEN: Soft, nontender, nondistended. Bowel sounds present. No organomegaly or mass.  EXTREMITIES: No pedal edema, cyanosis, or clubbing.  NEUROLOGIC: Cranial nerves II through XII are intact. Muscle strength 5/5 in all extremities. Sensation intact. Gait not checked.  PSYCHIATRIC: The patient is alert and oriented x 3.  SKIN: No obvious rash, lesion, or ulcer.  Data Review     CBC w Diff:  Lab Results  Component Value Date   WBC  5.7 06/23/2017   HGB 13.2 06/23/2017   HGB 12.8 (L) 04/12/2014   HCT 41.7 06/23/2017   HCT 40.9 04/12/2014   PLT 118 (L) 06/23/2017   PLT 215 04/12/2014   LYMPHOPCT 22 06/22/2017   LYMPHOPCT 22.3 03/15/2014   BANDSPCT 0 07/25/2016   MONOPCT 20 06/22/2017   MONOPCT 16.9 03/15/2014   EOSPCT 2 06/22/2017   EOSPCT 2.6 03/15/2014   BASOPCT 1 06/22/2017   BASOPCT 0.8 03/15/2014   CMP:  Lab Results  Component Value Date   NA 136 06/23/2017   NA 135 (L) 04/12/2014   K 4.3 06/23/2017   K 5.2 01/12/2015   K 3.6 04/12/2014   CL 96 (L) 06/23/2017   CL 97 (L) 04/12/2014   CO2 25 06/23/2017   CO2 28 04/12/2014   BUN 43 (H) 06/23/2017   BUN 40 (H) 04/12/2014   CREATININE 6.23 (H) 06/23/2017   CREATININE 9.58 (H) 04/12/2014   PROT 8.9 (H) 06/23/2017   PROT 8.0 04/12/2014   ALBUMIN 3.5 06/23/2017   ALBUMIN 3.0 (L) 04/12/2014   BILITOT 1.0 06/23/2017   BILITOT 0.8 04/12/2014   ALKPHOS 104 06/23/2017   ALKPHOS 173 (H) 04/12/2014   AST 35 06/23/2017   AST 38 (H) 04/12/2014   ALT 31 06/23/2017   ALT 30 04/12/2014  .  Micro Results Recent Results (from the past 240 hour(s))  MRSA PCR Screening     Status: None   Collection Time: 06/22/17 10:57 PM  Result Value Ref Range Status   MRSA by PCR NEGATIVE NEGATIVE Final    Comment:        The GeneXpert MRSA Assay (FDA approved for NASAL specimens only), is one component of a comprehensive MRSA colonization surveillance program. It is not intended to diagnose MRSA infection nor to guide or monitor treatment for MRSA infections. Performed at [facility], [address] [place], [place] [address]         Code Status Orders  (From admission, onward)        Start     Ordered   06/22/17 2146  Full code  Continuous     06/22/17 2145    Code Status History    Date Active Date Inactive Code Status Order ID Comments User Context   04/07/2017 09:37 04/10/2017 18:28 Full Code [id number]  Eloise Levels, MD ED    01/19/2017 01:45 01/21/2017 19:41 Full Code [id number]  Lance Coon, MD Inpatient   11/11/2016 04:21 11/14/2016 19:08 Full Code [id number]  Harrie Foreman, MD Inpatient   10/28/2016 07:56 10/30/2016 19:30 Full Code [id number]  Demetrios Loll, MD ED   08/21/2016 04:08 08/22/2016 18:26 DNR [id number]  Harrie Foreman, MD Inpatient   08/03/2016 23:19 08/07/2016 19:28 DNR [id number]  Lance Coon, MD Inpatient   07/26/2016 17:07 07/31/2016 21:58 DNR [id number]  Vaughan Basta, MD Inpatient   05/22/2016 00:34 05/24/2016 19:11 Full Code [id number]  Harvie Bridge, DO Inpatient   01/13/2015 01:44 01/14/2015 14:46 Full Code [id number]  Juluis Mire, MD ED   12/15/2014 09:32 12/15/2014 14:31 Full Code [id number]  Algernon Huxley, MD Inpatient   05/06/2014 20:29 05/10/2014 17:23 Full Code [id number]  Elam Dutch, MD Inpatient   05/04/2014 22:24 05/06/2014 20:29 Full Code [id number]  Rise Patience, MD Inpatient    Advance Directive Documentation     Most Recent Value  Type of  Advance Directive  Healthcare Power of Attorney  Pre-existing out of facility DNR order (yellow form or pink MOST form)  No data  "MOST" Form in Place?  No data          Follow-up Information    Aycock, Ngwe A, MD Follow up in 1 week(s).   Specialty:  Family Medicine Why:  hospital f/u Contact information: [place] [address] [phone]           Discharge Medications   Allergies as of 06/23/2017      Reactions   Dust Mite Extract Other (See Comments)   Reaction: unknown      Medication List    TAKE these medications   acetaminophen 500 MG tablet Commonly known as:  TYLENOL Take by mouth.   albuterol 108 (90 Base) MCG/ACT inhaler Commonly known as:  PROVENTIL HFA;VENTOLIN HFA Inhale 1-2 puffs every 6 (six) hours as needed into the lungs for wheezing or shortness of breath.   apixaban 5 MG Tabs tablet Commonly known as:  ELIQUIS Take 1 tablet (5 mg total) by mouth 2  (two) times daily.   aspirin EC 325 MG tablet Take 325 mg by mouth daily.   atorvastatin 10 MG tablet Commonly known as:  LIPITOR Take 10 mg by mouth at bedtime.   budesonide-formoterol 160-4.5 MCG/ACT inhaler Commonly known as:  SYMBICORT Inhale 2 puffs into the lungs 2 (two) times daily. Reported on 08/14/2015   calcium acetate 667 MG capsule Commonly known as:  PHOSLO Take 667 mg 3 (three) times daily by mouth.   carvedilol 3.125 MG tablet Commonly known as:  COREG Take 1 tablet (3.125 mg total) by mouth 2 (two) times daily with a meal.   cetirizine 10 MG tablet Commonly known as:  ZYRTEC Take 10 mg by mouth daily.   COMBIVENT RESPIMAT 20-100 MCG/ACT Aers respimat Generic drug:  Ipratropium-Albuterol Inhale 1 puff into the lungs every 6 (six) hours as needed for wheezing or shortness of breath.   docusate sodium 100 MG capsule Commonly known as:  COLACE Take by mouth.   fluticasone 50 MCG/ACT nasal spray Commonly known as:  FLONASE Place 1 spray daily into the nose.   isosorbide mononitrate 30 MG 24 hr tablet Commonly known as:  IMDUR Take 30 mg by mouth daily.   midodrine 10 MG tablet Commonly known as:  PROAMATINE Take 5 mg by mouth 2 (two) times daily.   multivitamin with minerals Tabs tablet Take 1 tablet by mouth 2 (two) times daily.   nitroGLYCERIN 0.4 MG SL tablet Commonly known as:  NITROSTAT Place 0.4 mg under the tongue every 5 (five) minutes as needed for chest pain.   pantoprazole 20 MG tablet Commonly known as:  PROTONIX Take 20 mg by mouth daily.   polyethylene glycol packet Commonly known as:  MIRALAX / GLYCOLAX Take 17 g by mouth daily as needed for mild constipation.   pregabalin 75 MG capsule Commonly known as:  LYRICA Take 1 capsule (75 mg total) by mouth daily. What changed:    how much to take  when to take this   sevelamer carbonate 800 MG tablet Commonly known as:  RENVELA Take 800 mg by mouth 3 (three) times daily with  meals.   tiotropium 18 MCG inhalation capsule Commonly known as:  SPIRIVA Place 18 mcg into inhaler and inhale daily.          Total Time in preparing paper work, data evaluation and todays exam -  35 minutes  Dustin Flock M.D on 06/23/2017 at 10:43 AM  [facility]   Office  [phone]

## 2017-06-23 NOTE — Progress Notes (Signed)
HD tx start 

## 2017-06-23 NOTE — NC FL2 (Signed)
[place] LEVEL OF CARE SCREENING TOOL     IDENTIFICATION  Patient Name: [name] Birthdate: Dec 09, 1955 Sex: male Admission Date (Current Location): 06/22/2017  [place] and [place] Number:  [name] [id number] [place] and Address:  [facility], [address], [place], [place] [address]      Provider Number: [id number]  Attending Physician Name and Address:  Dustin Flock, MD  Relative Name and Phone Number:  Alfred Levins Sister [phone], or Jackie Plum   [phone] or graves,christy Daughter   [phone]     Current Level of Care: Hospital Recommended Level of Care: [place] Prior Approval Number:    Date Approved/Denied:   PASRR Number:    Discharge Plan: Domiciliary (Rest home)([facility])    Current Diagnoses: Patient Active Problem List   Diagnosis Date Noted  . Apnea   . End-stage renal disease on hemodialysis ([place])   . Hypervolemia   . Respiratory distress   . Altered mental status   . Acute on chronic systolic CHF (congestive heart failure) ([place]) 01/18/2017  . Respiratory failure with hypoxia ([place]) 11/11/2016  . Acute respiratory failure ([place]) 10/28/2016  . Pulmonary embolism ([place]) 08/03/2016  . NSTEMI (non-ST elevated myocardial infarction) ([place]) 07/26/2016  . Acute respiratory failure with hypoxia ([place]) 05/24/2016  . Right lower lobe pneumonia ([place]) 05/24/2016  . Elevated troponin 05/24/2016  . Pulmonary edema 05/21/2016  . Kidney dialysis as the cause of abnormal reaction of the patient, or of later complication, without mention of misadventure at the time of the procedure (CODE) 03/05/2016  . PVD (peripheral vascular disease) ([place]) 03/05/2016  . Preop examination 05/02/2015  . Chronic systolic heart failure ([place])   . Sepsis ([place]) 01/13/2015  . Acute encephalopathy 01/13/2015  . Hyperkalemia 01/13/2015  . Gangrene of foot ([place]) 05/04/2014  . ESRD (end stage renal  disease) on dialysis ([place]) 05/04/2014  . CAD (coronary artery disease) 05/04/2014  . Normocytic anemia 05/04/2014  . Hyperlipidemia 04/26/2010  . HYPERTENSION, BENIGN 04/26/2010  . CAD, NATIVE VESSEL 04/26/2010    Orientation RESPIRATION BLADDER Height & Weight     Self, Time, Situation, Place  O2(3L) Continent Weight: 193 lb 3.2 oz (87.6 kg) Height:  6\' 2"  (188 cm)  BEHAVIORAL SYMPTOMS/MOOD NEUROLOGICAL BOWEL NUTRITION STATUS      Continent Diet(Fluid Restriction and renal diet)  AMBULATORY STATUS COMMUNICATION OF NEEDS Skin   Supervision Verbally Normal                       Personal Care Assistance Level of Assistance  Bathing, Feeding, Dressing Bathing Assistance: Limited assistance Feeding assistance: Independent Dressing Assistance: Limited assistance     Functional Limitations Info  Sight, Hearing, Speech Sight Info: Adequate Hearing Info: Adequate Speech Info: Adequate    SPECIAL CARE FACTORS FREQUENCY                       Contractures Contractures Info: Not present    Additional Factors Info  Code Status, Allergies Code Status Info: Full Code Allergies Info: DUST MITE EXTRACT            Current Medications (06/23/2017):  This is the current hospital active medication list Current Facility-Administered Medications  Medication Dose Route Frequency Provider Last Rate Last Dose  . acetaminophen (TYLENOL) tablet 650 mg  650 mg Oral Q6H PRN Henreitta Leber, MD       Or  . acetaminophen (TYLENOL) suppository 650 mg  650 mg Rectal Q6H PRN Henreitta Leber, MD      . albuterol (PROVENTIL) (2.5 MG/3ML) 0.083% nebulizer solution 3 mL  3 mL Inhalation Q6H PRN Henreitta Leber, MD      . albuterol (PROVENTIL) (2.5 MG/3ML) 0.083% nebulizer solution 5 mg  5 mg Nebulization Once Carrie Mew, MD      . apixaban Arne [place]) tablet 5 mg  5 mg Oral BID Henreitta Leber, MD   5 mg at 06/23/17 0947  . atorvastatin (LIPITOR) tablet 10 mg  10 mg Oral QHS  Henreitta Leber, MD   Stopped at 06/23/17 0100  . calcium acetate (PHOSLO) capsule 667 mg  667 mg Oral TID Henreitta Leber, MD   667 mg at 06/23/17 0947  . carvedilol (COREG) tablet 3.125 mg  3.125 mg Oral BID WC Henreitta Leber, MD   3.125 mg at 06/23/17 0957  . ipratropium-albuterol (DUONEB) 0.5-2.5 (3) MG/3ML nebulizer solution 3 mL  3 mL Nebulization Q6H PRN Henreitta Leber, MD      . isosorbide mononitrate (IMDUR) 24 hr tablet 30 mg  30 mg Oral Daily Henreitta Leber, MD   30 mg at 06/23/17 0947  . loratadine (CLARITIN) tablet 10 mg  10 mg Oral Daily Henreitta Leber, MD   10 mg at 06/23/17 0947  . midodrine (PROAMATINE) tablet 5 mg  5 mg Oral BID Henreitta Leber, MD   5 mg at 06/23/17 0947  . mometasone-formoterol (DULERA) 200-5 MCG/ACT inhaler 2 puff  2 puff Inhalation BID Henreitta Leber, MD   2 puff at 06/23/17 0951  . multivitamin with minerals tablet 1 tablet  1 tablet Oral BID Henreitta Leber, MD   1 tablet at 06/23/17 0948  . nitroGLYCERIN (NITROSTAT) SL tablet 0.4 mg  0.4 mg Sublingual Q5 min PRN Sainani, Belia Heman, MD      . ondansetron (ZOFRAN) tablet 4 mg  4 mg Oral Q6H PRN Henreitta Leber, MD       Or  . ondansetron (ZOFRAN) injection 4 mg  4 mg Intravenous Q6H PRN Henreitta Leber, MD      . pantoprazole (PROTONIX) EC tablet 40 mg  40 mg Oral Daily Henreitta Leber, MD   40 mg at 06/23/17 0947  . polyethylene glycol (MIRALAX / GLYCOLAX) packet 17 g  17 g Oral Daily PRN Henreitta Leber, MD      . pregabalin (LYRICA) capsule 150 mg  150 mg Oral BID Henreitta Leber, MD   150 mg at 06/23/17 0946  . sevelamer carbonate (RENVELA) tablet 800 mg  800 mg Oral TID WC Henreitta Leber, MD   800 mg at 06/23/17 0947  . tiotropium (SPIRIVA) inhalation capsule 18 mcg  18 mcg Inhalation Daily Henreitta Leber, MD   18 mcg at 06/23/17 8938     Discharge Medications: TAKE these medications   acetaminophen 500 MG tablet Commonly known as:  TYLENOL Take by mouth.   albuterol 108  (90 Base) MCG/ACT inhaler Commonly known as:  PROVENTIL HFA;VENTOLIN HFA Inhale 1-2 puffs every 6 (six) hours as needed into the lungs for wheezing or shortness of breath.   apixaban 5 MG Tabs tablet Commonly known as:  ELIQUIS Take 1 tablet (5 mg total) by mouth 2 (two) times daily.   aspirin EC 325 MG tablet Take 325 mg by mouth daily.   atorvastatin 10 MG tablet Commonly known as:  LIPITOR Take 10 mg by mouth at  bedtime.   budesonide-formoterol 160-4.5 MCG/ACT inhaler Commonly known as:  SYMBICORT Inhale 2 puffs into the lungs 2 (two) times daily. Reported on 08/14/2015   calcium acetate 667 MG capsule Commonly known as:  PHOSLO Take 667 mg 3 (three) times daily by mouth.   carvedilol 3.125 MG tablet Commonly known as:  COREG Take 1 tablet (3.125 mg total) by mouth 2 (two) times daily with a meal.   cetirizine 10 MG tablet Commonly known as:  ZYRTEC Take 10 mg by mouth daily.   COMBIVENT RESPIMAT 20-100 MCG/ACT Aers respimat Generic drug:  Ipratropium-Albuterol Inhale 1 puff into the lungs every 6 (six) hours as needed for wheezing or shortness of breath.   docusate sodium 100 MG capsule Commonly known as:  COLACE Take by mouth.   fluticasone 50 MCG/ACT nasal spray Commonly known as:  FLONASE Place 1 spray daily into the nose.   isosorbide mononitrate 30 MG 24 hr tablet Commonly known as:  IMDUR Take 30 mg by mouth daily.   midodrine 10 MG tablet Commonly known as:  PROAMATINE Take 5 mg by mouth 2 (two) times daily.   multivitamin with minerals Tabs tablet Take 1 tablet by mouth 2 (two) times daily.   nitroGLYCERIN 0.4 MG SL tablet Commonly known as:  NITROSTAT Place 0.4 mg under the tongue every 5 (five) minutes as needed for chest pain.   pantoprazole 20 MG tablet Commonly known as:  PROTONIX Take 20 mg by mouth daily.   polyethylene glycol packet Commonly known as:  MIRALAX / GLYCOLAX Take 17 g by mouth daily as needed for mild  constipation.   pregabalin 75 MG capsule Commonly known as:  LYRICA Take 1 capsule (75 mg total) by mouth daily. What changed:    how much to take  when to take this   sevelamer carbonate 800 MG tablet Commonly known as:  RENVELA Take 800 mg by mouth 3 (three) times daily with meals.   tiotropium 18 MCG inhalation capsule Commonly known as:  SPIRIVA Place 18 mcg into inhaler and inhale daily.      Relevant Imaging Results:  Relevant Lab Results:   Additional Information SS#  [id number]  Anell Barr

## 2017-06-23 NOTE — Progress Notes (Signed)
Report given to Keda at [facility].

## 2017-06-23 NOTE — Discharge Instructions (Signed)
[place] [facility] [place]:  Renal diet  DISCHARGE CONDITION:  Stable  ACTIVITY:  Activity as tolerated  OXYGEN:  Home Oxygen: Yes.     Oxygen Delivery: 3 liters/min via Patient connected to nasal cannula oxygen  DISCHARGE LOCATION:  home    ADDITIONAL DISCHARGE INSTRUCTION:   If you experience worsening of your admission symptoms, develop shortness of breath, life threatening emergency, suicidal or homicidal thoughts you must seek medical attention immediately by calling 911 or calling your MD immediately  if symptoms less severe.  You Must read complete instructions/literature along with all the possible adverse reactions/side effects for all the Medicines you take and that have been prescribed to you. Take any new Medicines after you have completely understood and accpet all the possible adverse reactions/side effects.   Please note  You were cared for by a hospitalist during your hospital stay. If you have any questions about your discharge medications or the care you received while you were in the hospital after you are discharged, you can call the unit and asked to speak with the hospitalist on call if the hospitalist that took care of you is not available. Once you are discharged, your primary care physician will handle any further medical issues. Please note that NO REFILLS for any discharge medications will be authorized once you are discharged, as it is imperative that you return to your primary care physician (or establish a relationship with a primary care physician if you do not have one) for your aftercare needs so that they can reassess your need for medications and monitor your lab values.

## 2017-06-23 NOTE — Clinical Social Work Note (Signed)
Clinical Social Work Assessment  Patient Details  Name: [name] MRN: [id number] Date of Birth: 08-11-1955  Date of referral:  06/23/17               Reason for consult:  Facility Placement                Permission sought to share information with:  Facility Sport and exercise psychologist Permission granted to share information::  Yes, Verbal Permission Granted  Name::     Alfred Levins Sister [phone], or Jackie Plum   [phone] or graves,christy Daughter   [phone]   Agency::  ALF admissions  Relationship::     Contact Information:     Housing/Transportation Living arrangements for the past 2 months:  [place] of Information:  Patient, Medical Team Patient Interpreter Needed:  None Criminal Activity/Legal Involvement Pertinent to Current Situation/Hospitalization:  No - Comment as needed Significant Relationships:  Other Family Members Lives with:  Facility Resident Do you feel safe going back to the place where you live?  Yes Need for family participation in patient care:  No (Coment)  Care giving concerns:  Patient does not express any concerns about going back to ALF.   Social Worker assessment / plan:  Patient is a 62 year old male who is alert and oriented x4 and from [facility], patient expresses that he is satisfied with his current placement.  Patient was explained role of CSW, and process of trying to have him return back to ALF.  Patient did not have any questions or concerns.  Patient is in agreement to returning back to ALF.  Employment status:  Disabled (Comment on whether or not currently receiving Disability), Retired Forensic scientist:  Medicare PT Recommendations:  Not assessed at this time Information / Referral to community resources:     Patient/Family's Response to care:  Patient and family agreeable to returning back to [facility].  Patient/Family's Understanding of and Emotional Response to Diagnosis,  Current Treatment, and Prognosis:  Patient is aware of current diagnosis and treatment plan.  Emotional Assessment Appearance:  Appears stated age Attitude/Demeanor/Rapport:    Affect (typically observed):  Appropriate, Calm Orientation:  Oriented to Self, Oriented to Place, Oriented to  Time, Oriented to Situation Alcohol / Substance use:  Not Applicable Psych involvement (Current and /or in the community):  No (Comment)  Discharge Needs  Concerns to be addressed:  Care Coordination Readmission within the last 30 days:  No Current discharge risk:  None Barriers to Discharge:  No Barriers Identified   Ross Ludwig, LCSWA 06/23/2017, 2:23 PM

## 2017-06-23 NOTE — Progress Notes (Signed)
HD tx end  

## 2017-06-24 ENCOUNTER — Encounter (INDEPENDENT_AMBULATORY_CARE_PROVIDER_SITE_OTHER): Payer: Medicare Other

## 2017-06-24 ENCOUNTER — Other Ambulatory Visit (INDEPENDENT_AMBULATORY_CARE_PROVIDER_SITE_OTHER): Payer: Self-pay | Admitting: Vascular Surgery

## 2017-06-24 ENCOUNTER — Ambulatory Visit (INDEPENDENT_AMBULATORY_CARE_PROVIDER_SITE_OTHER): Payer: Medicare Other | Admitting: Vascular Surgery

## 2017-06-24 DIAGNOSIS — N186 End stage renal disease: Secondary | ICD-10-CM

## 2017-06-24 LAB — PARATHYROID HORMONE, INTACT (NO CA): PTH: 495 pg/mL — AB (ref 15–65)

## 2017-06-24 LAB — HEPATITIS B SURFACE ANTIGEN: Hepatitis B Surface Ag: NEGATIVE

## 2017-06-24 LAB — HEPATITIS B SURFACE ANTIBODY, QUANTITATIVE: Hepatitis B-Post: <3.1 m[IU]/mL — ABNORMAL LOW (ref >9.9)

## 2017-07-01 ENCOUNTER — Encounter (INDEPENDENT_AMBULATORY_CARE_PROVIDER_SITE_OTHER): Payer: Self-pay

## 2017-07-01 ENCOUNTER — Encounter (INDEPENDENT_AMBULATORY_CARE_PROVIDER_SITE_OTHER): Payer: Medicare Other

## 2017-07-01 ENCOUNTER — Ambulatory Visit (INDEPENDENT_AMBULATORY_CARE_PROVIDER_SITE_OTHER): Payer: Medicare Other | Admitting: Vascular Surgery

## 2017-07-03 ENCOUNTER — Other Ambulatory Visit (INDEPENDENT_AMBULATORY_CARE_PROVIDER_SITE_OTHER): Payer: Self-pay | Admitting: Vascular Surgery

## 2017-07-03 DIAGNOSIS — I739 Peripheral vascular disease, unspecified: Secondary | ICD-10-CM

## 2017-07-04 ENCOUNTER — Inpatient Hospital Stay: Payer: Medicare Other | Admitting: Internal Medicine

## 2017-07-08 ENCOUNTER — Encounter (INDEPENDENT_AMBULATORY_CARE_PROVIDER_SITE_OTHER): Payer: Self-pay

## 2017-07-08 ENCOUNTER — Ambulatory Visit (INDEPENDENT_AMBULATORY_CARE_PROVIDER_SITE_OTHER): Payer: Medicare Other

## 2017-07-08 ENCOUNTER — Ambulatory Visit (INDEPENDENT_AMBULATORY_CARE_PROVIDER_SITE_OTHER): Payer: Medicare Other | Admitting: Vascular Surgery

## 2017-07-08 DIAGNOSIS — I739 Peripheral vascular disease, unspecified: Secondary | ICD-10-CM

## 2017-07-09 ENCOUNTER — Encounter (INDEPENDENT_AMBULATORY_CARE_PROVIDER_SITE_OTHER): Payer: Self-pay | Admitting: *Deleted

## 2017-07-11 ENCOUNTER — Encounter (INDEPENDENT_AMBULATORY_CARE_PROVIDER_SITE_OTHER): Payer: Self-pay

## 2017-07-15 ENCOUNTER — Encounter: Payer: Self-pay | Admitting: Internal Medicine

## 2017-07-15 ENCOUNTER — Ambulatory Visit (INDEPENDENT_AMBULATORY_CARE_PROVIDER_SITE_OTHER): Payer: Medicare Other | Admitting: Internal Medicine

## 2017-07-15 VITALS — BP 132/62 | HR 61 | Resp 16 | Ht 74.0 in | Wt 210.0 lb

## 2017-07-15 DIAGNOSIS — F17210 Nicotine dependence, cigarettes, uncomplicated: Secondary | ICD-10-CM

## 2017-07-15 DIAGNOSIS — G4733 Obstructive sleep apnea (adult) (pediatric): Secondary | ICD-10-CM

## 2017-07-15 NOTE — Progress Notes (Signed)
[name]      Assessment and Plan:  Obstructive sleep apnea. -History of sleep apnea diagnosed remotely, did not tolerate CPAP and it was taken away.  Continues to have significant symptoms of daytime sleepiness. -Per insurance requirements we will need to send him for a new sleep study in order to requalify him for CPAP. -Patient was previously intolerant to due to full facemask, hopefully we can use a nasal mask that will be better tolerated.  Essential hypertension, diabetes mellitus, systolic congestive heart failure. -These conditions can contribute to obstructive sleep apnea, and are contributing to by obstructive sleep apnea, therefore appropriate treatment the patient's underlying sleep apnea is an important part of their management.  Nicotine abuse. -Discussed the importance of smoke cessation on his health.  Spent greater than 3 minutes in discussion.   Date: 07/15/2017  MRN# [id number] [name] 1955-08-09  Referring Physician: Hospitalist physician.   [name] is a 62 y.o. old male seen in [name] for chief complaint of:    Chief Complaint  Patient presents with  . Hospitalization Follow-up    Eval for possible sleep apnea: pt having trouble going and staying asleep.     HPI:   The patient is a 62 year old male with a history of copd, continued nicotine above, persistent right pleural effusion, ascites, history of CAD with chronic systolic congestive heart failure, CHF (EF 20 %), hypertension, hyperlipidemia, history of PE, end-stage renal disease on hemodialysis, peripheral vascular disease status post left below the knee amputation.  He also has a history of right thoracentesis 09/08/13 at [facility]. Patient has had 3 admissions to the [facility] in the past 6 months for multiple problems including CHF exacerbation, volume overload from missed hemodialysis.  During his recent hospital admission in January 2019 it was noted that  he had symptoms and signs of severe obstructive sleep apnea. He feels that he is sleepy during the day, his epworth is 18 today.  He naps on and off during the day, he tends to fall asleep if he is not doing anything. He has been told that he snores at night, and that he was told that he stops breathing in his sleep.   He has had a sleep study a few years ago and was told that he had sleep apnea. He got set up with CPAP, but had to take it off because he felt that he could not breathe and felt that he was being smothered. The cpap got taken away then. He does recall that when he was able to sleep with it he was feeling less sleep during the day.   He is smoking 3 cigs per day, one at each meal. He has quit in the past but then restarted. He is thinking about quitting but not yet ready.    PMHX:   Past Medical History:  Diagnosis Date  . Amputation, traumatic, toes ([name])    Right Foot  . Amputee, below knee, left ([name])   . Anemia   . Asthma   . Cardiomyopathy ([place])   . CHF (congestive heart failure) ([name])   . Chronic systolic heart failure ([name])   . Complication of anesthesia    hypotension  . COPD (chronic obstructive pulmonary disease) ([name])   . Coronary artery disease   . Dialysis patient ([name])    Mon, Wed, Fri  . End stage renal disease ([name])   . GERD (gastroesophageal reflux disease)   . Headache   . History  of kidney stones   . History of pulmonary embolism   . HLD (hyperlipidemia)   . HTN (hypertension)   . Hyperparathyroidism   . Myocardial infarction ([name])   . Peripheral vascular disease ([name])   . Shortness of breath dyspnea   . Sleep apnea    NO C-PAP, Patient stated in process of  "getting one"   . Tobacco dependence    Surgical Hx:  Past Surgical History:  Procedure Laterality Date  . AMPUTATION Left 05/06/2014   Procedure: AMPUTATION BELOW KNEE;  Surgeon: Elam Dutch, MD;  Location: [place];  Service: Vascular;  Laterality: Left;  . AMPUTATION Right  01/12/2015   Procedure: Foot transmetatarsal amputation;  Surgeon: Algernon Huxley, MD;  Location: [facility];  Service: Vascular;  Laterality: Right;  . APPLICATION OF WOUND VAC Right 03/01/2015   Procedure: Application of Bio-connekt graft and wound vac application to right foot ;  Surgeon: Algernon Huxley, MD;  Location: [facility];  Service: Vascular;  Laterality: Right;  . AV FISTULA PLACEMENT Left   . AV FISTULA PLACEMENT Left 11/28/2016   Procedure: ARTERIOVENOUS (AV) FISTULA CREATION;  Surgeon: Algernon Huxley, MD;  Location: [facility];  Service: Vascular;  Laterality: Left;  . CARDIAC CATHETERIZATION     stent placement   . CORONARY ANGIOPLASTY    . DIALYSIS/PERMA CATHETER INSERTION N/A 07/31/2016   Procedure: Dialysis/Perma Catheter Insertion;  Surgeon: Algernon Huxley, MD;  Location: [place] [facility];  Service: Cardiovascular;  Laterality: N/A;  . IR FLUORO GUIDE CV LINE LEFT  04/10/2017  . LIGATION OF ARTERIOVENOUS  FISTULA Right 01/31/2016   Procedure: LIGATION OF ARTERIOVENOUS  FISTULA;  Surgeon: Algernon Huxley, MD;  Location: [facility];  Service: Vascular;  Laterality: Right;  . PERIPHERAL VASCULAR CATHETERIZATION Right 12/15/2014   Procedure: Lower Extremity Angiography;  Surgeon: Algernon Huxley, MD;  Location: [place] [facility];  Service: Cardiovascular;  Laterality: Right;  . PERIPHERAL VASCULAR CATHETERIZATION  12/15/2014   Procedure: Lower Extremity Intervention;  Surgeon: Algernon Huxley, MD;  Location: [place] [facility];  Service: Cardiovascular;;  . PERIPHERAL VASCULAR CATHETERIZATION Right 08/14/2015   Procedure: A/V Shuntogram/Fistulagram;  Surgeon: Algernon Huxley, MD;  Location: [place] [facility];  Service: Cardiovascular;  Laterality: Right;  . PERIPHERAL VASCULAR CATHETERIZATION N/A 08/14/2015   Procedure: A/V Shunt Intervention;  Surgeon: Algernon Huxley, MD;  Location: [place] [facility];  Service: Cardiovascular;  Laterality: N/A;  . PERIPHERAL VASCULAR CATHETERIZATION N/A 01/11/2016   Procedure:  Dialysis/Perma Catheter Insertion;  Surgeon: Algernon Huxley, MD;  Location: [place] [facility];  Service: Cardiovascular;  Laterality: N/A;  . REVISON OF ARTERIOVENOUS FISTULA Right 02/17/2016   Procedure: removal of AV fistula;  Surgeon: Serafina Mitchell, MD;  Location: [facility];  Service: Vascular;  Laterality: Right;  . REVISON OF ARTERIOVENOUS FISTULA Right 01/31/2016   Procedure: REVISON OF ARTERIOVENOUS FISTULA ( BRACHIOCEPHALIC ) W/ ARTEGRAFT;  Surgeon: Algernon Huxley, MD;  Location: [facility];  Service: Vascular;  Laterality: Right;  . TRANSMETATARSAL AMPUTATION Right 05/04/2015   Procedure: TRANSMETATARSAL AMPUTATION REVISION, great toe amputation;  Surgeon: Algernon Huxley, MD;  Location: [facility];  Service: Vascular;  Laterality: Right;   Family Hx:  Family History  Problem Relation Age of Onset  . Leukemia Mother   . Heart attack Father   . Heart failure Other   . Hypertension Other   . Leukemia Other   . Diabetes Other   . Prostate  cancer Neg Hx   . Kidney cancer Neg Hx   . Bladder Cancer Neg Hx    Social Hx:   Social History   Tobacco Use  . Smoking status: Light Tobacco Smoker    Packs/day: 0.25    Years: 30.00    Pack years: 7.50    Types: Cigarettes  . Smokeless tobacco: Never Used  . Tobacco comment: 5  Substance Use Topics  . Alcohol use: No    Alcohol/week: 0.0 oz  . Drug use: No    Comment: has used crack cocaine in past    Medication:    Current Outpatient Medications:  .  acetaminophen (TYLENOL) 500 MG tablet, Take by mouth., Disp: , Rfl:  .  albuterol (PROVENTIL HFA;VENTOLIN HFA) 108 (90 Base) MCG/ACT inhaler, Inhale 1-2 puffs every 6 (six) hours as needed into the lungs for wheezing or shortness of breath., Disp: , Rfl:  .  apixaban (ELIQUIS) 5 MG TABS tablet, Take 1 tablet (5 mg total) by mouth 2 (two) times daily., Disp: 60 tablet, Rfl: 2 .  aspirin EC 325 MG tablet, Take 325 mg by mouth daily. , Disp: , Rfl:  .  atorvastatin (LIPITOR) 10 MG tablet, Take 10  mg by mouth at bedtime. , Disp: , Rfl:  .  budesonide-formoterol (SYMBICORT) 160-4.5 MCG/ACT inhaler, Inhale 2 puffs into the lungs 2 (two) times daily. Reported on 08/14/2015, Disp: , Rfl:  .  calcium acetate (PHOSLO) 667 MG capsule, Take 667 mg 3 (three) times daily by mouth. , Disp: , Rfl:  .  carvedilol (COREG) 3.125 MG tablet, Take 1 tablet (3.125 mg total) by mouth 2 (two) times daily with a meal., Disp: 60 tablet, Rfl: 2 .  cetirizine (ZYRTEC) 10 MG tablet, Take 10 mg by mouth daily., Disp: , Rfl:  .  docusate sodium (COLACE) 100 MG capsule, Take by mouth., Disp: , Rfl:  .  fluticasone (FLONASE) 50 MCG/ACT nasal spray, Place 1 spray daily into the nose. , Disp: , Rfl:  .  guaifenesin (ROBAFEN) 100 MG/5ML syrup, Take 200 mg by mouth 3 (three) times daily as needed for cough., Disp: , Rfl:  .  hydrocortisone 2.5 % cream, Apply 1 application topically as needed., Disp: , Rfl:  .  Ipratropium-Albuterol (COMBIVENT RESPIMAT) 20-100 MCG/ACT AERS respimat, Inhale 1 puff into the lungs every 6 (six) hours as needed for wheezing or shortness of breath., Disp: , Rfl:  .  isosorbide mononitrate (IMDUR) 30 MG 24 hr tablet, Take 30 mg by mouth daily. , Disp: , Rfl:  .  midodrine (PROAMATINE) 10 MG tablet, Take 5 mg by mouth 2 (two) times daily., Disp: , Rfl:  .  Multiple Vitamin (MULTIVITAMIN WITH MINERALS) TABS tablet, Take 1 tablet by mouth 2 (two) times daily., Disp: , Rfl:  .  nitroGLYCERIN (NITROSTAT) 0.4 MG SL tablet, Place 0.4 mg under the tongue every 5 (five) minutes as needed for chest pain., Disp: , Rfl:  .  pantoprazole (PROTONIX) 20 MG tablet, Take 20 mg by mouth daily., Disp: , Rfl:  .  polyethylene glycol (MIRALAX / GLYCOLAX) packet, Take 17 g by mouth daily as needed for mild constipation. , Disp: , Rfl:  .  pregabalin (LYRICA) 75 MG capsule, Take 1 capsule (75 mg total) by mouth daily. (Patient taking differently: Take 150 mg 2 (two) times daily by mouth. ), Disp: 20 capsule, Rfl: 0 .   sevelamer carbonate (RENVELA) 800 MG tablet, Take 800 mg by mouth 3 (three) times daily with  meals. , Disp: , Rfl:  .  tiotropium (SPIRIVA) 18 MCG inhalation capsule, Place 18 mcg into inhaler and inhale daily., Disp: , Rfl:    Allergies:  Dust mite extract  Review of Systems: Gen:  Denies  fever, sweats, chills HEENT: Denies blurred vision, double vision. bleeds, sore throat Cvc:  No dizziness, chest pain. Resp:   Denies cough or sputum production, shortness of breath Gi: Denies swallowing difficulty, stomach pain. Gu:  Denies bladder incontinence, burning urine Ext:   No Joint pain, stiffness. Skin: No skin rash,  hives  Endoc:  No polyuria, polydipsia. Psych: No depression, insomnia. Other:  All other systems were reviewed with the patient and were negative other that what is mentioned in the HPI.   Physical Examination:   VS: BP 132/62 (BP Location: Left Arm, Cuff Size: Normal)   Pulse 61   Resp 16   Ht 6\' 2"  (1.88 m)   Wt 210 lb (95.3 kg) Comment: per pt left leg amputee  SpO2 100%   BMI 26.96 kg/m   General Appearance: No distress  Neuro:without focal findings,  speech normal,  HEENT: PERRLA, EOM intact.  Mallampati 4 Pulmonary: normal breath sounds, No wheezing.  CardiovascularNormal S1,S2.  No m/r/g.   Abdomen: Benign, Soft, non-tender. Renal:  No costovertebral tenderness  GU:  No performed at this time. Endoc: No evident thyromegaly, no signs of acromegaly. Skin:   warm, no rashes, no ecchymosis  Extremities: normal, no cyanosis, left foot amputation  Other findings:    LABORATORY PANEL:   CBC No results for input(s): WBC, HGB, HCT, PLT in the last 168 hours. ------------------------------------------------------------------------------------------------------------------  Chemistries  No results for input(s): NA, K, CL, CO2, GLUCOSE, BUN, CREATININE, CALCIUM, MG, AST, ALT, ALKPHOS, BILITOT in the last 168 hours.  Invalid input(s):  GFRCGP ------------------------------------------------------------------------------------------------------------------  Cardiac Enzymes No results for input(s): TROPONINI in the last 168 hours. ------------------------------------------------------------  RADIOLOGY:  No results found.     Thank  you for the [name] and for allowing [facility] to assist in the care of your patient. Our recommendations are noted above.  Please contact us if we can be of further service.   Marda Stalker, MD.  Board Certified in Internal Medicine, Pulmonary Medicine, [place], and Sleep Medicine.  [facility] Office Number: [phone]  Patricia Pesa, M.D.  Merton Border, M.D  07/15/2017

## 2017-07-15 NOTE — Patient Instructions (Signed)
--

## 2017-07-21 ENCOUNTER — Other Ambulatory Visit: Payer: Self-pay

## 2017-07-21 ENCOUNTER — Emergency Department: Payer: Medicare Other

## 2017-07-21 ENCOUNTER — Inpatient Hospital Stay
Admission: EM | Admit: 2017-07-21 | Discharge: 2017-07-25 | DRG: 270 | Disposition: A | Discharge status: Home / Self Care | Payer: Medicare Other | Attending: Internal Medicine | Admitting: Internal Medicine

## 2017-07-21 DIAGNOSIS — K920 Hematemesis: Secondary | ICD-10-CM | POA: Diagnosis present

## 2017-07-21 DIAGNOSIS — I10 Essential (primary) hypertension: Secondary | ICD-10-CM | POA: Diagnosis not present

## 2017-07-21 DIAGNOSIS — Z8249 Family history of ischemic heart disease and other diseases of the circulatory system: Secondary | ICD-10-CM

## 2017-07-21 DIAGNOSIS — G4730 Sleep apnea, unspecified: Secondary | ICD-10-CM | POA: Diagnosis present

## 2017-07-21 DIAGNOSIS — I429 Cardiomyopathy, unspecified: Secondary | ICD-10-CM | POA: Diagnosis present

## 2017-07-21 DIAGNOSIS — E785 Hyperlipidemia, unspecified: Secondary | ICD-10-CM | POA: Diagnosis present

## 2017-07-21 DIAGNOSIS — I132 Hypertensive heart and chronic kidney disease with heart failure and with stage 5 chronic kidney disease, or end stage renal disease: Secondary | ICD-10-CM | POA: Diagnosis present

## 2017-07-21 DIAGNOSIS — Z87442 Personal history of urinary calculi: Secondary | ICD-10-CM

## 2017-07-21 DIAGNOSIS — R0602 Shortness of breath: Secondary | ICD-10-CM | POA: Diagnosis present

## 2017-07-21 DIAGNOSIS — I214 Non-ST elevation (NSTEMI) myocardial infarction: Secondary | ICD-10-CM | POA: Diagnosis present

## 2017-07-21 DIAGNOSIS — Z89512 Acquired absence of left leg below knee: Secondary | ICD-10-CM | POA: Diagnosis not present

## 2017-07-21 DIAGNOSIS — K219 Gastro-esophageal reflux disease without esophagitis: Secondary | ICD-10-CM | POA: Diagnosis present

## 2017-07-21 DIAGNOSIS — I5042 Chronic combined systolic (congestive) and diastolic (congestive) heart failure: Secondary | ICD-10-CM | POA: Diagnosis present

## 2017-07-21 DIAGNOSIS — E1151 Type 2 diabetes mellitus with diabetic peripheral angiopathy without gangrene: Secondary | ICD-10-CM | POA: Diagnosis present

## 2017-07-21 DIAGNOSIS — Z86711 Personal history of pulmonary embolism: Secondary | ICD-10-CM | POA: Diagnosis not present

## 2017-07-21 DIAGNOSIS — Z9861 Coronary angioplasty status: Secondary | ICD-10-CM

## 2017-07-21 DIAGNOSIS — I2510 Atherosclerotic heart disease of native coronary artery without angina pectoris: Secondary | ICD-10-CM | POA: Diagnosis present

## 2017-07-21 DIAGNOSIS — F17210 Nicotine dependence, cigarettes, uncomplicated: Secondary | ICD-10-CM | POA: Diagnosis present

## 2017-07-21 DIAGNOSIS — A419 Sepsis, unspecified organism: Secondary | ICD-10-CM

## 2017-07-21 DIAGNOSIS — I959 Hypotension, unspecified: Secondary | ICD-10-CM | POA: Diagnosis not present

## 2017-07-21 DIAGNOSIS — N2581 Secondary hyperparathyroidism of renal origin: Secondary | ICD-10-CM | POA: Diagnosis present

## 2017-07-21 DIAGNOSIS — J209 Acute bronchitis, unspecified: Secondary | ICD-10-CM | POA: Diagnosis present

## 2017-07-21 DIAGNOSIS — J9621 Acute and chronic respiratory failure with hypoxia: Secondary | ICD-10-CM | POA: Diagnosis present

## 2017-07-21 DIAGNOSIS — D631 Anemia in chronic kidney disease: Secondary | ICD-10-CM | POA: Diagnosis present

## 2017-07-21 DIAGNOSIS — N186 End stage renal disease: Secondary | ICD-10-CM | POA: Diagnosis present

## 2017-07-21 DIAGNOSIS — Z7901 Long term (current) use of anticoagulants: Secondary | ICD-10-CM

## 2017-07-21 DIAGNOSIS — I252 Old myocardial infarction: Secondary | ICD-10-CM | POA: Diagnosis not present

## 2017-07-21 DIAGNOSIS — Z992 Dependence on renal dialysis: Secondary | ICD-10-CM | POA: Diagnosis not present

## 2017-07-21 DIAGNOSIS — E875 Hyperkalemia: Secondary | ICD-10-CM | POA: Diagnosis present

## 2017-07-21 DIAGNOSIS — J440 Chronic obstructive pulmonary disease with acute lower respiratory infection: Secondary | ICD-10-CM | POA: Diagnosis present

## 2017-07-21 DIAGNOSIS — Z9981 Dependence on supplemental oxygen: Secondary | ICD-10-CM

## 2017-07-21 DIAGNOSIS — Z806 Family history of leukemia: Secondary | ICD-10-CM

## 2017-07-21 DIAGNOSIS — E871 Hypo-osmolality and hyponatremia: Secondary | ICD-10-CM

## 2017-07-21 DIAGNOSIS — A4101 Sepsis due to Methicillin susceptible Staphylococcus aureus: Secondary | ICD-10-CM | POA: Diagnosis present

## 2017-07-21 DIAGNOSIS — Z7982 Long term (current) use of aspirin: Secondary | ICD-10-CM

## 2017-07-21 DIAGNOSIS — J441 Chronic obstructive pulmonary disease with (acute) exacerbation: Secondary | ICD-10-CM | POA: Diagnosis present

## 2017-07-21 DIAGNOSIS — I509 Heart failure, unspecified: Secondary | ICD-10-CM

## 2017-07-21 DIAGNOSIS — T827XXA Infection and inflammatory reaction due to other cardiac and vascular devices, implants and grafts, initial encounter: Secondary | ICD-10-CM | POA: Diagnosis present

## 2017-07-21 DIAGNOSIS — I5022 Chronic systolic (congestive) heart failure: Secondary | ICD-10-CM

## 2017-07-21 DIAGNOSIS — Z7951 Long term (current) use of inhaled steroids: Secondary | ICD-10-CM

## 2017-07-21 LAB — CBC WITH DIFFERENTIAL/PLATELET
Basophils Absolute: 0.1 10*3/uL (ref 0–0.1)
Basophils Relative: 1 %
EOS ABS: 0.1 10*3/uL (ref 0–0.7)
EOS PCT: 2 %
HCT: 41.9 % (ref 40.0–52.0)
Hemoglobin: 13.5 g/dL (ref 13.0–18.0)
LYMPHS ABS: 1.9 10*3/uL (ref 1.0–3.6)
Lymphocytes Relative: 20 %
MCH: 28.3 pg (ref 26.0–34.0)
MCHC: 32.2 g/dL (ref 32.0–36.0)
MCV: 87.8 fL (ref 80.0–100.0)
MONO ABS: 1.6 10*3/uL — AB (ref 0.2–1.0)
MONOS PCT: 17 %
Neutro Abs: 6 10*3/uL (ref 1.4–6.5)
Neutrophils Relative %: 62 %
PLATELETS: 175 10*3/uL (ref 150–440)
RBC: 4.78 MIL/uL (ref 4.40–5.90)
RDW: 19.3 % — ABNORMAL HIGH (ref 11.5–14.5)
WBC: 9.7 10*3/uL (ref 3.8–10.6)

## 2017-07-21 LAB — COMPREHENSIVE METABOLIC PANEL
ALK PHOS: 140 U/L — AB (ref 38–126)
ALT: 30 U/L (ref 17–63)
ANION GAP: 14 (ref 5–15)
AST: 31 U/L (ref 15–41)
Albumin: 3.6 g/dL (ref 3.5–5.0)
BILIRUBIN TOTAL: 1 mg/dL (ref 0.3–1.2)
BUN: 86 mg/dL — AB (ref 6–20)
CALCIUM: 9.6 mg/dL (ref 8.9–10.3)
CO2: 22 mmol/L (ref 22–32)
Chloride: 96 mmol/L — ABNORMAL LOW (ref 101–111)
Creatinine, Ser: 12.64 mg/dL — ABNORMAL HIGH (ref 0.61–1.24)
GFR calc Af Amer: 4 mL/min — ABNORMAL LOW (ref >60)
GFR, EST NON AFRICAN AMERICAN: 4 mL/min — AB (ref >60)
Glucose, Bld: 91 mg/dL (ref 65–99)
POTASSIUM: 7 mmol/L — AB (ref 3.5–5.1)
Sodium: 132 mmol/L — ABNORMAL LOW (ref 135–145)
TOTAL PROTEIN: 9.7 g/dL — AB (ref 6.5–8.1)

## 2017-07-21 LAB — INFLUENZA PANEL BY PCR (TYPE A & B)
INFLAPCR: NEGATIVE
Influenza B By PCR: NEGATIVE

## 2017-07-21 LAB — LACTIC ACID, PLASMA
LACTIC ACID, VENOUS: 1.4 mmol/L (ref 0.5–1.9)
Lactic Acid, Venous: 1.5 mmol/L (ref 0.5–1.9)

## 2017-07-21 LAB — TROPONIN I: TROPONIN I: 0.19 ng/mL — AB (ref <0.03)

## 2017-07-21 LAB — BRAIN NATRIURETIC PEPTIDE: B Natriuretic Peptide: >4500 pg/mL — ABNORMAL HIGH (ref 0.0–100.0)

## 2017-07-21 LAB — PROTIME-INR
INR: 1.52
Prothrombin Time: 18.2 seconds — ABNORMAL HIGH (ref 11.4–15.2)

## 2017-07-21 MED ORDER — DOXYCYCLINE HYCLATE 100 MG PO TABS
100.0000 mg | ORAL_TABLET | Freq: Two times a day (BID) | ORAL | Status: DC
  Administered 2017-07-21: 100 mg via ORAL
  Filled 2017-07-21: qty 1

## 2017-07-21 MED ORDER — PANTOPRAZOLE SODIUM 40 MG PO TBEC
40.0000 mg | DELAYED_RELEASE_TABLET | Freq: Every day | ORAL | Status: DC
  Administered 2017-07-22 – 2017-07-24 (×3): 40 mg via ORAL
  Filled 2017-07-21 (×4): qty 1

## 2017-07-21 MED ORDER — GUAIFENESIN 100 MG/5ML PO SYRP
200.0000 mg | ORAL_SOLUTION | Freq: Three times a day (TID) | ORAL | Status: DC | PRN
  Filled 2017-07-21: qty 10

## 2017-07-21 MED ORDER — IPRATROPIUM-ALBUTEROL 0.5-2.5 (3) MG/3ML IN SOLN: 3.0000 mL | Freq: Four times a day (QID) | RESPIRATORY_TRACT | Status: DC | PRN

## 2017-07-21 MED ORDER — ORAL CARE MOUTH RINSE
15.0000 mL | Freq: Two times a day (BID) | OROMUCOSAL | Status: DC
  Administered 2017-07-21 – 2017-07-23 (×4): 15 mL via OROMUCOSAL

## 2017-07-21 MED ORDER — ALBUTEROL SULFATE (2.5 MG/3ML) 0.083% IN NEBU: 2.5000 mg | INHALATION_SOLUTION | Freq: Four times a day (QID) | RESPIRATORY_TRACT | Status: DC | PRN

## 2017-07-21 MED ORDER — POLYETHYLENE GLYCOL 3350 17 G PO PACK: 17.0000 g | PACK | Freq: Every day | ORAL | Status: DC | PRN

## 2017-07-21 MED ORDER — ALBUTEROL SULFATE HFA 108 (90 BASE) MCG/ACT IN AERS: 1.0000 | INHALATION_SPRAY | Freq: Four times a day (QID) | RESPIRATORY_TRACT | Status: DC | PRN

## 2017-07-21 MED ORDER — APIXABAN 5 MG PO TABS
5.0000 mg | ORAL_TABLET | Freq: Two times a day (BID) | ORAL | Status: DC
  Administered 2017-07-21 – 2017-07-25 (×8): 5 mg via ORAL
  Filled 2017-07-21 (×8): qty 1

## 2017-07-21 MED ORDER — LORATADINE 10 MG PO TABS
10.0000 mg | ORAL_TABLET | Freq: Every day | ORAL | Status: DC
Start: 2017-07-22 — End: 2017-07-26
  Administered 2017-07-22 – 2017-07-24 (×3): 10 mg via ORAL
  Filled 2017-07-21 (×3): qty 1

## 2017-07-21 MED ORDER — METHYLPREDNISOLONE SODIUM SUCC 125 MG IJ SOLR: 60.0000 mg | INTRAMUSCULAR | Status: DC

## 2017-07-21 MED ORDER — SODIUM CHLORIDE 0.9 % IV SOLN: 250.0000 mL | INTRAVENOUS | Status: DC | PRN

## 2017-07-21 MED ORDER — KETOROLAC TROMETHAMINE 30 MG/ML IJ SOLN: 30.0000 mg | Freq: Four times a day (QID) | INTRAMUSCULAR | Status: DC | PRN

## 2017-07-21 MED ORDER — ASPIRIN EC 325 MG PO TBEC
325.0000 mg | DELAYED_RELEASE_TABLET | Freq: Every day | ORAL | Status: DC
  Administered 2017-07-22 – 2017-07-24 (×3): 325 mg via ORAL
  Filled 2017-07-21 (×3): qty 1

## 2017-07-21 MED ORDER — TIOTROPIUM BROMIDE MONOHYDRATE 18 MCG IN CAPS
18.0000 ug | ORAL_CAPSULE | Freq: Every day | RESPIRATORY_TRACT | Status: DC
  Administered 2017-07-22 – 2017-07-25 (×4): 18 ug via RESPIRATORY_TRACT
  Filled 2017-07-21: qty 5

## 2017-07-21 MED ORDER — BISACODYL 5 MG PO TBEC
5.0000 mg | DELAYED_RELEASE_TABLET | Freq: Every day | ORAL | Status: DC | PRN
Start: 2017-07-21 — End: 2017-07-26

## 2017-07-21 MED ORDER — SODIUM CHLORIDE 0.9% FLUSH: 3.0000 mL | INTRAVENOUS | Status: DC | PRN

## 2017-07-21 MED ORDER — PREGABALIN 75 MG PO CAPS
75.0000 mg | ORAL_CAPSULE | Freq: Every day | ORAL | Status: DC
  Administered 2017-07-22 – 2017-07-24 (×3): 75 mg via ORAL
  Filled 2017-07-21 (×3): qty 1

## 2017-07-21 MED ORDER — TRAMADOL HCL 50 MG PO TABS: 50.0000 mg | ORAL_TABLET | Freq: Four times a day (QID) | ORAL | Status: DC | PRN

## 2017-07-21 MED ORDER — NITROGLYCERIN 0.4 MG SL SUBL: 0.4000 mg | SUBLINGUAL_TABLET | SUBLINGUAL | Status: DC | PRN

## 2017-07-21 MED ORDER — IPRATROPIUM-ALBUTEROL 20-100 MCG/ACT IN AERS: 1.0000 | INHALATION_SPRAY | Freq: Four times a day (QID) | RESPIRATORY_TRACT | Status: DC | PRN

## 2017-07-21 MED ORDER — SODIUM BICARBONATE 8.4 % IV SOLN
50.0000 meq | Freq: Once | INTRAVENOUS | Status: AC
  Administered 2017-07-21: 50 meq via INTRAVENOUS
  Filled 2017-07-21: qty 50

## 2017-07-21 MED ORDER — IOPAMIDOL (ISOVUE-370) INJECTION 76%
75.0000 mL | Freq: Once | INTRAVENOUS | Status: AC | PRN
  Administered 2017-07-21: 75 mL via INTRAVENOUS

## 2017-07-21 MED ORDER — SEVELAMER CARBONATE 800 MG PO TABS
800.0000 mg | ORAL_TABLET | Freq: Three times a day (TID) | ORAL | Status: DC
  Administered 2017-07-21 – 2017-07-25 (×11): 800 mg via ORAL
  Filled 2017-07-21 (×10): qty 1

## 2017-07-21 MED ORDER — ADULT MULTIVITAMIN W/MINERALS CH
1.0000 | ORAL_TABLET | Freq: Two times a day (BID) | ORAL | Status: DC
  Administered 2017-07-21 – 2017-07-24 (×6): 1 via ORAL
  Filled 2017-07-21 (×6): qty 1

## 2017-07-21 MED ORDER — ASPIRIN 81 MG PO CHEW
324.0000 mg | CHEWABLE_TABLET | Freq: Once | ORAL | Status: AC
  Administered 2017-07-21: 324 mg via ORAL
  Filled 2017-07-21: qty 4

## 2017-07-21 MED ORDER — ACETAMINOPHEN 500 MG PO TABS: 500.0000 mg | ORAL_TABLET | Freq: Four times a day (QID) | ORAL | Status: DC | PRN

## 2017-07-21 MED ORDER — METHYLPREDNISOLONE SODIUM SUCC 125 MG IJ SOLR
125.0000 mg | Freq: Once | INTRAMUSCULAR | Status: AC
  Administered 2017-07-21: 125 mg via INTRAVENOUS
  Filled 2017-07-21: qty 2

## 2017-07-21 MED ORDER — CARVEDILOL 6.25 MG PO TABS
3.1250 mg | ORAL_TABLET | Freq: Two times a day (BID) | ORAL | Status: DC
  Administered 2017-07-21 – 2017-07-25 (×6): 3.125 mg via ORAL
  Filled 2017-07-21 (×9): qty 1

## 2017-07-21 MED ORDER — ONDANSETRON HCL 4 MG/2ML IJ SOLN: 4.0000 mg | Freq: Four times a day (QID) | INTRAMUSCULAR | Status: DC | PRN

## 2017-07-21 MED ORDER — HYDRALAZINE HCL 20 MG/ML IJ SOLN: 10.0000 mg | Freq: Four times a day (QID) | INTRAMUSCULAR | Status: DC | PRN

## 2017-07-21 MED ORDER — DEXTROSE 50 % IV SOLN
1.0000 | Freq: Once | INTRAVENOUS | Status: AC
  Administered 2017-07-21: 50 mL via INTRAVENOUS
  Filled 2017-07-21: qty 50

## 2017-07-21 MED ORDER — INSULIN ASPART 100 UNIT/ML ~~LOC~~ SOLN
10.0000 [IU] | Freq: Once | SUBCUTANEOUS | Status: AC
  Administered 2017-07-21: 10 [IU] via INTRAVENOUS
  Filled 2017-07-21: qty 1

## 2017-07-21 MED ORDER — SODIUM CHLORIDE 0.9% FLUSH
3.0000 mL | Freq: Two times a day (BID) | INTRAVENOUS | Status: DC
  Administered 2017-07-21 – 2017-07-25 (×8): 3 mL via INTRAVENOUS

## 2017-07-21 MED ORDER — CALCIUM ACETATE (PHOS BINDER) 667 MG PO CAPS
667.0000 mg | ORAL_CAPSULE | Freq: Every day | ORAL | Status: DC
  Administered 2017-07-22 – 2017-07-24 (×3): 667 mg via ORAL
  Filled 2017-07-21 (×3): qty 1

## 2017-07-21 MED ORDER — MOMETASONE FURO-FORMOTEROL FUM 200-5 MCG/ACT IN AERO
2.0000 | INHALATION_SPRAY | Freq: Two times a day (BID) | RESPIRATORY_TRACT | Status: DC
  Administered 2017-07-21 – 2017-07-25 (×9): 2 via RESPIRATORY_TRACT
  Filled 2017-07-21: qty 8.8

## 2017-07-21 MED ORDER — ATORVASTATIN CALCIUM 10 MG PO TABS
10.0000 mg | ORAL_TABLET | Freq: Every day | ORAL | Status: DC
  Administered 2017-07-21 – 2017-07-25 (×5): 10 mg via ORAL
  Filled 2017-07-21 (×5): qty 1

## 2017-07-21 MED ORDER — HYDROMORPHONE HCL 1 MG/ML IJ SOLN
0.5000 mg | Freq: Once | INTRAMUSCULAR | Status: AC
  Administered 2017-07-21: 0.5 mg via INTRAVENOUS
  Filled 2017-07-21: qty 1

## 2017-07-21 MED ORDER — ONDANSETRON HCL 4 MG PO TABS: 4.0000 mg | ORAL_TABLET | Freq: Four times a day (QID) | ORAL | Status: DC | PRN

## 2017-07-21 MED ORDER — MIDODRINE HCL 5 MG PO TABS
5.0000 mg | ORAL_TABLET | Freq: Two times a day (BID) | ORAL | Status: DC
  Administered 2017-07-21 – 2017-07-25 (×8): 5 mg via ORAL
  Filled 2017-07-21 (×9): qty 1

## 2017-07-21 MED ORDER — LEVOFLOXACIN IN D5W 750 MG/150ML IV SOLN
750.0000 mg | Freq: Once | INTRAVENOUS | Status: AC
  Administered 2017-07-21: 750 mg via INTRAVENOUS
  Filled 2017-07-21: qty 150

## 2017-07-21 MED ORDER — SODIUM CHLORIDE 0.9 % IV SOLN
1.0000 g | Freq: Once | INTRAVENOUS | Status: AC
  Administered 2017-07-21: 1 g via INTRAVENOUS
  Filled 2017-07-21: qty 10

## 2017-07-21 MED ORDER — DOCUSATE SODIUM 100 MG PO CAPS: 100.0000 mg | ORAL_CAPSULE | Freq: Every day | ORAL | Status: DC | PRN

## 2017-07-21 MED ORDER — ISOSORBIDE MONONITRATE ER 30 MG PO TB24
30.0000 mg | ORAL_TABLET | Freq: Every day | ORAL | Status: DC
  Administered 2017-07-23 – 2017-07-24 (×2): 30 mg via ORAL
  Filled 2017-07-21 (×2): qty 1

## 2017-07-21 NOTE — ED Notes (Signed)
Patient reports using oxygen at night while sleeping, on Saturday the tank ran out and he had to switch it.  Reports began to have pain at dialysis access site (left upper chest) that radiates up into his left neck.  Patient also reports having increasing shortness of breath.  Patient is short at breath at rest in ED stretcher.

## 2017-07-21 NOTE — ED Notes (Signed)
Pt's breathing is labored and he is using accessory muscles to breathe. Pt alert & oriented. States that when he had dialysis on Friday, all fluid removed was replaced because his bp dropped and he has had sob since then.

## 2017-07-21 NOTE — ED Notes (Signed)
IV attempt x 2 unsuccessful (1 attempt on right hand and 2nd attempt across base of right thumb). Labs were obtained from 2nd attempt prior to infiltration.  MD made aware.

## 2017-07-21 NOTE — ED Provider Notes (Addendum)
[facility] Emergency Department Provider Note  ____________________________________________   First MD Initiated Contact with Patient 07/21/17 [phone]     (approximate)  I have reviewed the triage vital signs and the nursing notes.   HISTORY  Chief Complaint Shortness of Breath and Pain at dialysis access    HPI [name] is a 62 y.o. male with extensive chronic medical issues as described below who presents for evaluation of shortness of breath and pain at his dialysis access site in the left chest.  He reports that he has been increasingly short of breath over the last 2 days, gradually getting worse during that time and is now severe.  The symptoms started the day after his last dialysis session on Saturday, the day after his last dialysis session, and he was scheduled for dialysis this morning but he could not make it due to the discomfort.  He reports that he was at his living facility and asked them to call EMS to bring him to the emergency department.  He also reports pain at the dialysis access site in the left chest, which has also been present over the last couple of days (since last HD).  He describes the shortness of breath is severe and nothing in particular makes it better, exertion makes it worse.  He denies chest pain, nausea, vomiting, and abdominal pain.     Past Medical History:  Diagnosis Date  . 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 Amputee, below knee, left ([place])   . Anemia   . Asthma   . Cardiomyopathy ([place])   . CHF (congestive heart failure) ([place])   . Chronic systolic heart failure ([place])   . Complication of anesthesia    hypotension  . COPD (chronic obstructive pulmonary disease) ([place])   . Coronary artery disease   . Dialysis patient ([place])    Mon, Wed, Fri  . End stage renal disease ([place])   . GERD (gastroesophageal reflux disease)   . Headache   . History of kidney stones   . History of pulmonary embolism     . HLD (hyperlipidemia)   . HTN (hypertension)   . Hyperparathyroidism   . Myocardial infarction ([place])   . Peripheral vascular disease ([place])   . Shortness of breath dyspnea   . Sleep apnea    NO C-PAP, Patient stated in process of  "getting one"   . Tobacco dependence     Patient Active Problem List   Diagnosis Date Noted  . Apnea   . End-stage renal disease on hemodialysis ([place])   . Hypervolemia   . Respiratory distress   . Altered mental status   . Acute on chronic systolic CHF (congestive heart failure) ([place]) 01/18/2017  . Respiratory failure with hypoxia ([place]) 11/11/2016  . Acute respiratory failure ([place]) 10/28/2016  . Pulmonary embolism ([place]) 08/03/2016  . NSTEMI (non-ST elevated myocardial infarction) ([place]) 07/26/2016  . Acute respiratory failure with hypoxia ([place]) 05/24/2016  . Right lower lobe pneumonia ([place]) 05/24/2016  . Elevated troponin 05/24/2016  . Pulmonary edema 05/21/2016  . Kidney dialysis as the cause of abnormal reaction of the patient, or of later complication, without mention of misadventure at the time of the procedure (CODE) 03/05/2016  . PVD (peripheral vascular disease) ([place]) 03/05/2016  . Preop examination 05/02/2015  . Chronic systolic heart failure ([place])   . Sepsis ([place]) 01/13/2015  . Acute encephalopathy 01/13/2015  . Hyperkalemia 01/13/2015  . Gangrene of foot ([place])  05/04/2014  . ESRD (end stage renal disease) on dialysis ([place]) 05/04/2014  . CAD (coronary artery disease) 05/04/2014  . Normocytic anemia 05/04/2014  . Hyperlipidemia 04/26/2010  . HYPERTENSION, BENIGN 04/26/2010  . CAD, NATIVE VESSEL 04/26/2010    Past Surgical History:  Procedure Laterality Date  . AMPUTATION Left 05/06/2014   Procedure: AMPUTATION BELOW KNEE;  Surgeon: Elam Dutch, MD;  Location: [facility];  Service: Vascular;  Laterality: Left;  . AMPUTATION Right 01/12/2015   Procedure: Foot transmetatarsal amputation;  Surgeon: Algernon Huxley, MD;  Location: [facility];   Service: Vascular;  Laterality: Right;  . APPLICATION OF WOUND VAC Right 03/01/2015   Procedure: Application of Bio-connekt graft and wound vac application to right foot ;  Surgeon: Algernon Huxley, MD;  Location: [facility];  Service: Vascular;  Laterality: Right;  . AV FISTULA PLACEMENT Left   . AV FISTULA PLACEMENT Left 11/28/2016   Procedure: ARTERIOVENOUS (AV) FISTULA CREATION;  Surgeon: Algernon Huxley, MD;  Location: [facility];  Service: Vascular;  Laterality: Left;  . CARDIAC CATHETERIZATION     stent placement   . CORONARY ANGIOPLASTY    . DIALYSIS/PERMA CATHETER INSERTION N/A 07/31/2016   Procedure: Dialysis/Perma Catheter Insertion;  Surgeon: Algernon Huxley, MD;  Location: [place] [facility];  Service: Cardiovascular;  Laterality: N/A;  . IR FLUORO GUIDE CV LINE LEFT  04/10/2017  . LIGATION OF ARTERIOVENOUS  FISTULA Right 01/31/2016   Procedure: LIGATION OF ARTERIOVENOUS  FISTULA;  Surgeon: Algernon Huxley, MD;  Location: [facility];  Service: Vascular;  Laterality: Right;  . PERIPHERAL VASCULAR CATHETERIZATION Right 12/15/2014   Procedure: Lower Extremity Angiography;  Surgeon: Algernon Huxley, MD;  Location: [place] [facility];  Service: Cardiovascular;  Laterality: Right;  . PERIPHERAL VASCULAR CATHETERIZATION  12/15/2014   Procedure: Lower Extremity Intervention;  Surgeon: Algernon Huxley, MD;  Location: [place] [facility];  Service: Cardiovascular;;  . PERIPHERAL VASCULAR CATHETERIZATION Right 08/14/2015   Procedure: A/V Shuntogram/Fistulagram;  Surgeon: Algernon Huxley, MD;  Location: [place] [facility];  Service: Cardiovascular;  Laterality: Right;  . PERIPHERAL VASCULAR CATHETERIZATION N/A 08/14/2015   Procedure: A/V Shunt Intervention;  Surgeon: Algernon Huxley, MD;  Location: [place] [facility];  Service: Cardiovascular;  Laterality: N/A;  . PERIPHERAL VASCULAR CATHETERIZATION N/A 01/11/2016   Procedure: Dialysis/Perma Catheter Insertion;  Surgeon: Algernon Huxley, MD;  Location: [place] [facility];  Service:  Cardiovascular;  Laterality: N/A;  . REVISON OF ARTERIOVENOUS FISTULA Right 02/17/2016   Procedure: removal of AV fistula;  Surgeon: Serafina Mitchell, MD;  Location: [facility];  Service: Vascular;  Laterality: Right;  . REVISON OF ARTERIOVENOUS FISTULA Right 01/31/2016   Procedure: REVISON OF ARTERIOVENOUS FISTULA ( BRACHIOCEPHALIC ) W/ ARTEGRAFT;  Surgeon: Algernon Huxley, MD;  Location: [facility];  Service: Vascular;  Laterality: Right;  . TRANSMETATARSAL AMPUTATION Right 05/04/2015   Procedure: TRANSMETATARSAL AMPUTATION REVISION, great toe amputation;  Surgeon: Algernon Huxley, MD;  Location: [facility];  Service: Vascular;  Laterality: Right;    Prior to Admission medications   Medication Sig Start Date End Date Taking? Authorizing Provider  acetaminophen (TYLENOL) 500 MG tablet Take 500 mg by mouth every 6 (six) hours as needed for mild pain.    Yes [provider]  albuterol (PROVENTIL HFA;VENTOLIN HFA) 108 (90 Base) MCG/ACT inhaler Inhale 1-2 puffs every 6 (six) hours as needed into the lungs for wheezing or shortness of breath.   Yes [provider]  apixaban (ELIQUIS) 5 MG TABS tablet Take 1 tablet (5 mg total) by mouth 2 (two) times daily. 01/20/17  Yes Gladstone Lighter, MD  aspirin EC 325 MG tablet Take 325 mg by mouth daily.    Yes [provider]  atorvastatin (LIPITOR) 10 MG tablet Take 10 mg by mouth at bedtime.    Yes [provider]  budesonide-formoterol (SYMBICORT) 160-4.5 MCG/ACT inhaler Inhale 2 puffs into the lungs 2 (two) times daily. Reported on 08/14/2015   Yes [provider]  calcium acetate (PHOSLO) 667 MG capsule Take 667 mg by mouth daily.    Yes [provider]  carvedilol (COREG) 3.125 MG tablet Take 1 tablet (3.125 mg total) by mouth 2 (two) times daily with a meal. 01/20/17  Yes Gladstone Lighter, MD  cetirizine (ZYRTEC) 10 MG tablet Take 10 mg by mouth daily.   Yes [provider]  docusate sodium (COLACE) 100 MG  capsule Take 100 mg by mouth daily as needed for mild constipation or moderate constipation.    Yes [provider]  fluticasone (FLONASE) 50 MCG/ACT nasal spray Place 1 spray daily into the nose.    Yes [provider]  guaifenesin (ROBAFEN) 100 MG/5ML syrup Take 200 mg by mouth 3 (three) times daily as needed for cough.   Yes [provider]  Ipratropium-Albuterol (COMBIVENT RESPIMAT) 20-100 MCG/ACT AERS respimat Inhale 1 puff into the lungs every 6 (six) hours as needed for wheezing or shortness of breath.   Yes [provider]  isosorbide mononitrate (IMDUR) 30 MG 24 hr tablet Take 30 mg by mouth daily.  08/22/16  Yes [provider]  midodrine (PROAMATINE) 10 MG tablet Take 5 mg by mouth 2 (two) times daily.   Yes [provider]  Multiple Vitamin (MULTIVITAMIN WITH MINERALS) TABS tablet Take 1 tablet by mouth 2 (two) times daily.   Yes [provider]  nitroGLYCERIN (NITROSTAT) 0.4 MG SL tablet Place 0.4 mg under the tongue every 5 (five) minutes as needed for chest pain.   Yes [provider]  pantoprazole (PROTONIX) 20 MG tablet Take 20 mg by mouth daily.   Yes [provider]  polyethylene glycol (MIRALAX / GLYCOLAX) packet Take 17 g by mouth daily as needed for mild constipation.    Yes [provider]  pregabalin (LYRICA) 75 MG capsule Take 1 capsule (75 mg total) by mouth daily. 01/22/17  Yes Gladstone Lighter, MD  sevelamer carbonate (RENVELA) 800 MG tablet Take 800 mg by mouth 3 (three) times daily with meals.    Yes [provider]  tiotropium (SPIRIVA) 18 MCG inhalation capsule Place 18 mcg into inhaler and inhale daily.   Yes [provider]    Allergies Dust mite extract  Family History  Problem Relation Age of Onset  . Leukemia Mother   . Heart attack Father   . Heart failure Other   . Hypertension Other   . Leukemia Other   . Diabetes Other   . Prostate cancer Neg  Hx   . Kidney cancer Neg Hx   . Bladder Cancer Neg Hx     Social History Social History   Tobacco Use  . Smoking status: Light Tobacco Smoker    Packs/day: 0.25    Years: 30.00    Pack years: 7.50    Types: Cigarettes  . Smokeless tobacco: Never Used  . Tobacco comment: 5  Substance Use Topics  . Alcohol use: No    Alcohol/week: 0.0 oz  .  Drug use: No    Comment: has used crack cocaine in past     Review of Systems Constitutional: No fever/chills Eyes: No visual changes. ENT: No sore throat. Cardiovascular: Denies chest pain. Respiratory: shortness of breath, gradually worsening over the last couple of days Gastrointestinal: No abdominal pain.  No nausea, no vomiting.  No diarrhea.  No constipation. Genitourinary: Negative for dysuria. Musculoskeletal: Negative for neck pain.  Negative for back pain.  Pain in left chest access site Integumentary: Negative for rash. Neurological: Negative for headaches, focal weakness or numbness.   ____________________________________________   PHYSICAL EXAM:  VITAL SIGNS: ED Triage Vitals  Enc Vitals Group     BP 07/21/17 0602 98/82     Pulse Rate 07/21/17 0602 82     Resp 07/21/17 0602 20     Temp 07/21/17 0602 98 F (36.7 C)     Temp Source 07/21/17 0602 Oral     SpO2 07/21/17 0602 98 %     Weight --      Height --      Head Circumference --      Peak Flow --      Pain Score 07/21/17 0600 9     Pain Loc --      Pain Edu? --      Excl. in [place]? --     Constitutional: Alert and oriented. No acute distress. Eyes: Conjunctivae are normal.  Head: Atraumatic. Nose: No congestion/rhinnorhea. Mouth/Throat: Mucous membranes are moist. Neck: No stridor.  No meningeal signs.   Cardiovascular: Normal rate, regular rhythm. Good peripheral circulation. Grossly normal heart sounds.  Left chest dialysis access is present. Respiratory:Lungs CTAB, but with slightly increased work of breathing. Gastrointestinal: Soft and nontender.  No distention.  Musculoskeletal: No lower extremity tenderness nor edema. No gross deformities of extremities. Neurologic:  Normal speech and language. No gross focal neurologic deficits are appreciated.  Skin:  Skin is warm, dry and intact. No rash noted. Psychiatric: Mood and affect are normal. Speech and behavior are normal.  ____________________________________________   LABS (all labs ordered are listed, but only abnormal results are displayed)  Labs Reviewed  CBC WITH DIFFERENTIAL/PLATELET - Abnormal; Notable for the following components:      Result Value   RDW 19.3 (*)    Monocytes Absolute 1.6 (*)    All other components within normal limits  INFLUENZA PANEL BY PCR (TYPE A & B)  COMPREHENSIVE METABOLIC PANEL  TROPONIN I  BRAIN NATRIURETIC PEPTIDE  LACTIC ACID, PLASMA  LACTIC ACID, PLASMA  BLOOD GAS, VENOUS   ____________________________________________  EKG  ED ECG REPORT I, Hinda Kehr, the attending physician, personally viewed and interpreted this ECG.  Date: 07/21/2017 EKG Time: 6:06 AM Rate: 82 Rhythm: normal sinus rhythm QRS Axis: normal Intervals: LBBB (pre-existing) ST/T Wave abnormalities: Non-specific ST segment / T-wave changes, but no evidence of acute ischemia. Narrative Interpretation: no evidence of acute ischemia   ____________________________________________  RADIOLOGY I, Hinda Kehr, personally viewed and evaluated these images (plain radiographs) as part of my medical decision making, as well as reviewing the written report by the radiologist.  ED MD interpretation: Small pleural effusion, no obvious pulmonary edema  Official radiology report(s): Dg Chest Portable 1 View  Result Date: 07/21/2017 CLINICAL DATA:  Chest pain and shortness of breath.  Renal failure. EXAM: PORTABLE CHEST 1 VIEW COMPARISON:  June 22, 2017 FINDINGS: Central catheter tip is in the right atrium. No pneumothorax. There is a small right pleural effusion with  bibasilar  atelectatic change. The lungs elsewhere are clear. Heart is enlarged with pulmonary vascularity within normal limits. There is aortic atherosclerosis. No adenopathy. No bone lesions. IMPRESSION: Small right pleural effusion with bibasilar atelectasis. Stable cardiomegaly. There is aortic atherosclerosis. Aortic Atherosclerosis (ICD10-I70.0). Electronically Signed   By: Lowella Grip III M.D.   On: 07/21/2017 07:01    ____________________________________________   PROCEDURES  Critical Care performed: No   Procedure(s) performed:   Procedures   ____________________________________________   INITIAL IMPRESSION / ASSESSMENT AND PLAN / ED COURSE  As part of my medical decision making, I reviewed the following data within the [place] notes reviewed and incorporated, Labs reviewed , EKG interpreted , Old chart reviewed, Patient signed out to Dr. Mariea Clonts and Radiograph reviewed     Differential includes, but is not limited to, viral syndrome, bronchitis including COPD exacerbation, pneumonia, reactive airway disease including asthma, CHF including exacerbation with or without pulmonary/interstitial edema, pneumothorax, ACS, thoracic trauma, and pulmonary embolism.  Additionally, he may have an issue with his permacath given that he does not typically have pain and has had worsening pain over the last 2 days.  He is now reporting that it is hurting up into his neck.  Infection seems unlikely given that leukocytosis, influenza A is negative, and he is afebrile.  However I do think he is likely volume overloaded given his increased work of breathing and the complaint of shortness of breath.  His blood pressure is now more in the normal range with a systolic of around 390 but he has increased work of  breathing and a small pleural effusion.  His EKG is unchanged from the most recent one, but looks very similar to the one prior to that.  Metabolic panel  (including electrolytes) still pending.  Discussed case with Dr. Mariea Clonts who is taking over case and obtaining CT scan of the chest.  Anticipate need for admission.     ____________________________________________  FINAL CLINICAL IMPRESSION(S) / ED DIAGNOSES  Final diagnoses:  SOB (shortness of breath)  ESRD on hemodialysis ([place])     MEDICATIONS GIVEN DURING THIS VISIT:  Medications - No data to display   ED Discharge Orders    None       Note:  This document was prepared using Dragon voice recognition software and may include unintentional dictation errors.    Hinda Kehr, MD 07/21/17 3009    Hinda Kehr, MD 07/21/17 [phone]

## 2017-07-21 NOTE — ED Provider Notes (Addendum)
Patient was signed out to me by Dr. Les Pou.  62 year old with a history of COPD, ESRD on HD, last dialysis on Friday, presenting for shortness of breath and pain around his the left upper PermCath site.  The patient has not been having any fever.  I went to evaluate the patient, and he is markedly tachypneic with a respiratory rate in the high 20s and low 30s, although he is maintaining oxygenation of 94% on 2 L nasal cannula.  He has a chest x-ray which shows a small right pleural effusion but no significant edema.  I am concerned about his increasing work of breathing, and have called respiratory to place him on BiPAP.  His VBG shows a pH of 7.29 and PCO2 of 51.    The patient does have an elevated troponin of 0.19.  This may be an end STEMI.  I do not see any evidence of significant ischemia on his EKG, and he will be treated with aspirin.  The patient is having shortness of breath, but no chest pain; acute heparinization is not indicated at this time.  His PermCath is in his left upper chest with some tenderness to palpation but no significant fluctuance, purulent discharge, surrounding erythema.  A CT scan has been ordered to evaluate for surrounding fluid collection or abscess, as well as associated clot.  PE is also on my differential, especially since the patient does not have significant amount of edema.  COPD exacerbation is also likely or possible, so steroids have been ordered.  I have ordered blood cultures and cover the patient with Levaquin in case he has a pneumonia with radiographic lag.  Dr. Juleen China, the nephrologist on-call has been notified that the patient will require dialysis.  His potassium today is 7.0 and calcium, bicarbonate, insulin have been ordered to stabilize his cardiac membrane.  His EKG does not show any peaked T wave and he has a left bundle branch block with a QRS of 141.  I will speak to the hospitalist about admission at this  time.  ----------------------------------------- 9:19 AM on 07/21/2017 ----------------------------------------- The patient has been taken off of BiPAP as his work of breathing has significantly improved, and placed back on his 2 L nasal cannula which is baseline for him.  His O2 sats are in the mid 90s on this.  The patient's CT scan does show small pulmonary emboli in the right lower lobe with a focal area of consolidation and some calcification suggestive that this is not acute.  The patient is already under treatment for PE with Coumadin and an INR is pending.  The patient has no evidence of hemodynamic instability including hypotension, tachycardia or hypoxia; no additional anticoagulation is indicated at this time.  At this time, the patient will be admitted to the hospitalist for further evaluation and treatment.   CRITICAL CARE Performed by: Eula Listen   Total critical care time: 40 minutes  Critical care time was exclusive of separately billable procedures and treating other patients.  Critical care was necessary to treat or prevent imminent or life-threatening deterioration.  Critical care was time spent personally by me on the following activities: development of treatment plan with patient and/or surrogate as well as nursing, discussions with consultants, evaluation of patient's response to treatment, examination of patient, obtaining history from patient or surrogate, ordering and performing treatments and interventions, ordering and review of laboratory studies, ordering and review of radiographic studies, pulse oximetry and re-evaluation of patient's condition.    Mariea Clonts,  [place], MD 07/21/17 [id number]    Eula Listen, MD 07/21/17 [id number]    Eula Listen, MD 07/21/17 [phone]

## 2017-07-21 NOTE — Progress Notes (Signed)
Pt was transported from the CCU to CT and back while on the bipap.

## 2017-07-21 NOTE — Progress Notes (Signed)
[facility]  ROUNDING NOTE   Subjective:   [name] admitted to [facility] on 07/21/2017 for Hyperkalemia [E87.5] Hyponatremia [E87.1] SOB (shortness of breath) [R06.02] NSTEMI (non-ST elevated myocardial infarction) ([place]) [I21.4] ESRD on hemodialysis ([place]) [N18.6, Z99.2] Acute on chronic congestive heart failure, unspecified heart failure type ([place]) [I50.9]   Patient's last hemodialysis was Friday.   Patient reports pain over his CVC, left IJ.   Objective:  Vital signs in last 24 hours:  Temp:  [98 F (36.7 C)] 98 F (36.7 C) (02/25 0602) Pulse Rate:  [69-82] 75 (02/25 1505) Resp:  [11-37] 11 (02/25 1430) BP: (96-154)/(64-138) 97/64 (02/25 1505) SpO2:  [95 %-100 %] 100 % (02/25 1505)  Weight change:  There were no vitals filed for this visit.  Intake/Output: No intake/output data recorded.   Intake/Output this shift:  Total I/O In: 260 [IV Piggyback:260] Out: -   Physical Exam: General: NAD  Head: Normocephalic, atraumatic. Moist oral mucosal membranes  Eyes: Anicteric, PERRL  Neck: Supple, trachea midline  Lungs:  Clear to auscultation  Heart: Regular rate and rhythm  Abdomen:  Soft, nontender,   Extremities: no peripheral edema.  Neurologic: Nonfocal, moving all four extremities  Skin: No lesions  Access: Left IJ permcath, left AVF maturing    Basic Metabolic Panel: Recent Labs  Lab 07/21/17 0659  NA 132*  K 7.0*  CL 96*  CO2 22  GLUCOSE 91  BUN 86*  CREATININE 12.64*  CALCIUM 9.6    Liver Function Tests: Recent Labs  Lab 07/21/17 0659  AST 31  ALT 30  ALKPHOS 140*  BILITOT 1.0  PROT 9.7*  ALBUMIN 3.6   No results for input(s): LIPASE, AMYLASE in the last 168 hours. No results for input(s): AMMONIA in the last 168 hours.  CBC: Recent Labs  Lab 07/21/17 0613  WBC 9.7  NEUTROABS 6.0  HGB 13.5  HCT 41.9  MCV 87.8  PLT 175    Cardiac Enzymes: Recent Labs  Lab 07/21/17 0659  TROPONINI 0.19*     BNP: Invalid input(s): POCBNP  CBG: No results for input(s): GLUCAP in the last 168 hours.  Microbiology: Results for orders placed or performed during the hospital encounter of 06/22/17  MRSA PCR Screening     Status: None   Collection Time: 06/22/17 10:57 PM  Result Value Ref Range Status   MRSA by PCR NEGATIVE NEGATIVE Final    Comment:        The GeneXpert MRSA Assay (FDA approved for NASAL specimens only), is one component of a comprehensive MRSA colonization surveillance program. It is not intended to diagnose MRSA infection nor to guide or monitor treatment for MRSA infections. Performed at [facility], [address] [place], [place] [address]     Coagulation Studies: No results for input(s): LABPROT, INR in the last 72 hours.  Urinalysis: No results for input(s): COLORURINE, LABSPEC, PHURINE, GLUCOSEU, HGBUR, BILIRUBINUR, KETONESUR, PROTEINUR, UROBILINOGEN, NITRITE, LEUKOCYTESUR in the last 72 hours.  Invalid input(s): APPERANCEUR    Imaging: Ct Angio Chest Pe W Or Wo Contrast  Result Date: 07/21/2017 CLINICAL DATA:  Shortness of breath.  Tenderness around PermCath. EXAM: CT ANGIOGRAPHY CHEST WITH CONTRAST TECHNIQUE: Multidetector CT imaging of the chest was performed using the standard protocol during bolus administration of intravenous contrast. Multiplanar CT image reconstructions and MIPs were obtained to evaluate the vascular anatomy. CONTRAST:  40mL ISOVUE-370 IOPAMIDOL (ISOVUE-370) INJECTION 76% COMPARISON:  Chest x-ray dated 07/21/2017 and CT scan of the chest  dated 08/03/2016 FINDINGS: Cardiovascular: There is a single segmental embolus in the anterior basal region of the right lower lobe as well as small subsegmental emboli in the right lower lobe. There is an adjacent peripheral area of lung consolidation in the right lower lobe with some calcifications. There is also adjacent moderate right pleural effusion. Chronic cardiomegaly, slightly  increased. Extensive coronary artery calcification and aortic atherosclerosis. PermCath in good position. Mediastinum/Nodes: No enlarged mediastinal, hilar, or axillary lymph nodes. Thyroid gland, trachea, and esophagus demonstrate no significant findings. Lungs/Pleura: Moderate right pleural effusion. Focal area of consolidation in the right lower lobe with some parenchymal calcifications indicating that this is most likely a chronic process. This could represent acute on chronic pulmonary infarct. Slight atelectasis at the left lung base. Upper Abdomen: No acute abnormality. Musculoskeletal: No acute abnormality. Osteophytes fuse much of the mid and lower thoracic spine. No evidence of an abscess or soft tissue inflammation at the site of the PermCath in the left supraclavicular region. Review of the MIP images confirms the above findings. IMPRESSION: 1. Small pulmonary emboli in the right lower lobe with a focal area of consolidation and some parenchymal calcification suggesting that this is not an acute process or could be acute or chronic pulmonary infarct. 2. Moderate right pleural effusion. 3. Chronic cardiomegaly, slightly increased. Coronary artery and aortic atherosclerosis. Electronically Signed   By: Lorriane Shire M.D.   On: 07/21/2017 09:07   Dg Chest Portable 1 View  Result Date: 07/21/2017 CLINICAL DATA:  Chest pain and shortness of breath.  Renal failure. EXAM: PORTABLE CHEST 1 VIEW COMPARISON:  June 22, 2017 FINDINGS: Central catheter tip is in the right atrium. No pneumothorax. There is a small right pleural effusion with bibasilar atelectatic change. The lungs elsewhere are clear. Heart is enlarged with pulmonary vascularity within normal limits. There is aortic atherosclerosis. No adenopathy. No bone lesions. IMPRESSION: Small right pleural effusion with bibasilar atelectasis. Stable cardiomegaly. There is aortic atherosclerosis. Aortic Atherosclerosis (ICD10-I70.0). Electronically Signed    By: Lowella Grip III M.D.   On: 07/21/2017 07:01     Medications:    . doxycycline  100 mg Oral Q12H     Assessment/ Plan:  [name] is a 62 y.o. black male with ESRD on hemodialysis, hypertension, hyperlipidemia, anemia, COPD, peripheral vascular disease, left BKA, right toe amputations  MWF [facility].   1. End stage renal disease with hyperkalemia  - Seen and examined on hemodialysis. 2K bath.   2. Anemia of chronic kidney disease: hemoglobin 13.2 - EPO as outpatient.   3. Hypertension: hypotensive - midodrine before dialysis. Did not receive midodrine  4. Secondary Hyperparathyroidism: - calcium acetate and sevelamer with meals.     LOS: 0 Nashid Pellum 2/25/20193:08 PM

## 2017-07-21 NOTE — Progress Notes (Signed)
HD Tx ended  

## 2017-07-21 NOTE — Progress Notes (Signed)
Post HD Tx  

## 2017-07-21 NOTE — H&P (Signed)
[facility] [place] NAME: [name]    MR#:  [id number]  DATE OF BIRTH:  03-Feb-1956  DATE OF ADMISSION:  07/21/2017  PRIMARY CARE PHYSICIAN: Donnie Coffin, MD   REQUESTING/REFERRING PHYSICIAN: Dr. Mariea Clonts  CHIEF COMPLAINT:   Shortness of breath HISTORY OF PRESENT ILLNESS:  [name]  is a 62 y.o. 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 Patient was brought to the ER for further evaluation due to shortness of breath. He was markedly tachypnea upon arrival and was placed on BiPAP. ABG showed pH of 7.29 and PCO2 of 51. Patient's symptoms have improved. He is now off BiPAP. He was also noted to have elevated troponin of 0.19. CT chest showed old pulmonary emboli. He has been given IV steroids for COPD exacerbation. He has also noted to have hyperkalemia. Nephrology has been contacted by ED physician for immediate dialysis.  He was also complaining of pain at the site of dialysis access.  PAST MEDICAL HISTORY:   Past Medical History:  Diagnosis Date  . Amputation, traumatic, toes ([place])    Right Foot  . Amputee, below knee, left ([place])   . Anemia   . Asthma   . Cardiomyopathy ([place])   . CHF (congestive heart failure) ([place])   . Chronic systolic heart failure ([place])   . Complication of anesthesia    hypotension  . COPD (chronic obstructive pulmonary disease) ([place])   . Coronary artery disease   . Dialysis patient ([place])    Mon, Wed, Fri  . End stage renal disease ([place])   . GERD (gastroesophageal reflux disease)   . Headache   . History of kidney stones   . History of pulmonary embolism   . HLD (hyperlipidemia)   . HTN (hypertension)   . Hyperparathyroidism   . Myocardial infarction ([place])   . Peripheral vascular disease ([place])   .  Shortness of breath dyspnea   . Sleep apnea    Joel C-PAP, Patient stated in process of  "getting one"   . Tobacco dependence     PAST SURGICAL HISTORY:   Past Surgical History:  Procedure Laterality Date  . AMPUTATION Left 05/06/2014   Procedure: AMPUTATION BELOW KNEE;  Surgeon: Elam Dutch, MD;  Location: [facility];  Service: Vascular;  Laterality: Left;  . AMPUTATION Right 01/12/2015   Procedure: Foot transmetatarsal amputation;  Surgeon: Algernon Huxley, MD;  Location: [facility];  Service: Vascular;  Laterality: Right;  . APPLICATION OF WOUND VAC Right 03/01/2015   Procedure: Application of Bio-connekt graft and wound vac application to right foot ;  Surgeon: Algernon Huxley, MD;  Location: [facility];  Service: Vascular;  Laterality: Right;  . AV FISTULA PLACEMENT Left   . AV FISTULA PLACEMENT Left 11/28/2016   Procedure: ARTERIOVENOUS (AV) FISTULA CREATION;  Surgeon: Algernon Huxley, MD;  Location: [facility];  Service: Vascular;  Laterality: Left;  . CARDIAC CATHETERIZATION     stent placement   . CORONARY ANGIOPLASTY    . DIALYSIS/PERMA CATHETER INSERTION N/A 07/31/2016   Procedure: Dialysis/Perma Catheter Insertion;  Surgeon: Algernon Huxley, MD;  Location: [facility];  Service: Cardiovascular;  Laterality: N/A;  . IR FLUORO GUIDE CV LINE LEFT  04/10/2017  . LIGATION OF ARTERIOVENOUS  FISTULA Right 01/31/2016   Procedure:  LIGATION OF ARTERIOVENOUS  FISTULA;  Surgeon: Algernon Huxley, MD;  Location: [facility];  Service: Vascular;  Laterality: Right;  . PERIPHERAL VASCULAR CATHETERIZATION Right 12/15/2014   Procedure: Lower Extremity Angiography;  Surgeon: Algernon Huxley, MD;  Location: [place];  Service: Cardiovascular;  Laterality: Right;  . PERIPHERAL VASCULAR CATHETERIZATION  12/15/2014   Procedure: Lower Extremity Intervention;  Surgeon: Algernon Huxley, MD;  Location: [facility];  Service: Cardiovascular;;  . PERIPHERAL VASCULAR CATHETERIZATION Right 08/14/2015   Procedure: A/V  Shuntogram/Fistulagram;  Surgeon: Algernon Huxley, MD;  Location: [facility];  Service: Cardiovascular;  Laterality: Right;  . PERIPHERAL VASCULAR CATHETERIZATION N/A 08/14/2015   Procedure: A/V Shunt Intervention;  Surgeon: Algernon Huxley, MD;  Location: [facility];  Service: Cardiovascular;  Laterality: N/A;  . PERIPHERAL VASCULAR CATHETERIZATION N/A 01/11/2016   Procedure: Dialysis/Perma Catheter Insertion;  Surgeon: Algernon Huxley, MD;  Location: [facility];  Service: Cardiovascular;  Laterality: N/A;  . REVISON OF ARTERIOVENOUS FISTULA Right 02/17/2016   Procedure: removal of AV fistula;  Surgeon: Serafina Mitchell, MD;  Location: [facility];  Service: Vascular;  Laterality: Right;  . REVISON OF ARTERIOVENOUS FISTULA Right 01/31/2016   Procedure: REVISON OF ARTERIOVENOUS FISTULA ( BRACHIOCEPHALIC ) W/ ARTEGRAFT;  Surgeon: Algernon Huxley, MD;  Location: [facility];  Service: Vascular;  Laterality: Right;  . TRANSMETATARSAL AMPUTATION Right 05/04/2015   Procedure: TRANSMETATARSAL AMPUTATION REVISION, great toe amputation;  Surgeon: Algernon Huxley, MD;  Location: [facility];  Service: Vascular;  Laterality: Right;    SOCIAL HISTORY:   Social History   Tobacco Use  . Smoking status: Light Tobacco Smoker    Packs/day: 0.25    Years: 30.00    Pack years: 7.50    Types: Cigarettes  . Smokeless tobacco: Never Used  . Tobacco comment: 5  Substance Use Topics  . Alcohol use: Joel    Alcohol/week: 0.0 oz    FAMILY HISTORY:   Family History  Problem Relation Age of Onset  . Leukemia Mother   . Heart attack Father   . Heart failure Other   . Hypertension Other   . Leukemia Other   . Diabetes Other   . Prostate cancer Neg Hx   . Kidney cancer Neg Hx   . Bladder Cancer Neg Hx     DRUG ALLERGIES:   Allergies  Allergen Reactions  . Dust Mite Extract Other (See Comments)    Reaction: unknown    REVIEW OF SYSTEMS:   Review of Systems  Constitutional: Negative.  Negative for  chills, fever and malaise/fatigue.  HENT: Negative.  Negative for ear discharge, ear pain, hearing loss, nosebleeds and sore throat.   Eyes: Negative.  Negative for blurred vision and pain.  Respiratory: Positive for cough, shortness of breath and wheezing. Negative for hemoptysis.   Cardiovascular: Negative.  Negative for chest pain, palpitations and leg swelling.  Gastrointestinal: Negative.  Negative for abdominal pain, blood in stool, diarrhea, nausea and vomiting.  Genitourinary: Negative.  Negative for dysuria.  Musculoskeletal: Negative.  Negative for back pain.  Skin: Negative.   Neurological: Negative for dizziness, tremors, speech change, focal weakness, seizures and headaches.  Endo/Heme/Allergies: Negative.  Does not bruise/bleed easily.  Psychiatric/Behavioral: Negative.  Negative for depression, hallucinations and suicidal ideas.    MEDICATIONS AT HOME:   Prior to Admission medications   Medication Sig Start Date End Date Taking? Authorizing Provider  acetaminophen (TYLENOL) 500 MG tablet  Take 500 mg by mouth every 6 (six) hours as needed for mild pain.    Yes [provider]  albuterol (PROVENTIL HFA;VENTOLIN HFA) 108 (90 Base) MCG/ACT inhaler Inhale 1-2 puffs every 6 (six) hours as needed into the lungs for wheezing or shortness of breath.   Yes [provider]  apixaban (ELIQUIS) 5 MG TABS tablet Take 1 tablet (5 mg total) by mouth 2 (two) times daily. 01/20/17  Yes Gladstone Lighter, MD  aspirin EC 325 MG tablet Take 325 mg by mouth daily.    Yes [provider]  atorvastatin (LIPITOR) 10 MG tablet Take 10 mg by mouth at bedtime.    Yes [provider]  budesonide-formoterol (SYMBICORT) 160-4.5 MCG/ACT inhaler Inhale 2 puffs into the lungs 2 (two) times daily. Reported on 08/14/2015   Yes [provider]  calcium acetate (PHOSLO) 667 MG capsule Take 667 mg by mouth daily.    Yes [provider]  carvedilol (COREG) 3.125  MG tablet Take 1 tablet (3.125 mg total) by mouth 2 (two) times daily with a meal. 01/20/17  Yes Gladstone Lighter, MD  cetirizine (ZYRTEC) 10 MG tablet Take 10 mg by mouth daily.   Yes [provider]  docusate sodium (COLACE) 100 MG capsule Take 100 mg by mouth daily as needed for mild constipation or moderate constipation.    Yes [provider]  fluticasone (FLONASE) 50 MCG/ACT nasal spray Place 1 spray daily into the nose.    Yes [provider]  guaifenesin (ROBAFEN) 100 MG/5ML syrup Take 200 mg by mouth 3 (three) times daily as needed for cough.   Yes [provider]  Ipratropium-Albuterol (COMBIVENT RESPIMAT) 20-100 MCG/ACT AERS respimat Inhale 1 puff into the lungs every 6 (six) hours as needed for wheezing or shortness of breath.   Yes [provider]  isosorbide mononitrate (IMDUR) 30 MG 24 hr tablet Take 30 mg by mouth daily.  08/22/16  Yes [provider]  midodrine (PROAMATINE) 10 MG tablet Take 5 mg by mouth 2 (two) times daily.   Yes [provider]  Multiple Vitamin (MULTIVITAMIN WITH MINERALS) TABS tablet Take 1 tablet by mouth 2 (two) times daily.   Yes [provider]  nitroGLYCERIN (NITROSTAT) 0.4 MG SL tablet Place 0.4 mg under the tongue every 5 (five) minutes as needed for chest pain.   Yes [provider]  pantoprazole (PROTONIX) 20 MG tablet Take 20 mg by mouth daily.   Yes [provider]  polyethylene glycol (MIRALAX / GLYCOLAX) packet Take 17 g by mouth daily as needed for mild constipation.    Yes [provider]  pregabalin (LYRICA) 75 MG capsule Take 1 capsule (75 mg total) by mouth daily. 01/22/17  Yes Gladstone Lighter, MD  sevelamer carbonate (RENVELA) 800 MG tablet Take 800 mg by mouth 3 (three) times daily with meals.    Yes [provider]  tiotropium (SPIRIVA) 18 MCG inhalation capsule Place 18 mcg into inhaler and inhale daily.   Yes [provider]       VITAL SIGNS:  Blood pressure (!) 152/106, pulse 75, temperature 98 F (36.7 C), temperature source Oral, resp. rate 16, SpO2 96 %.  PHYSICAL EXAMINATION:   Physical Exam  Constitutional: He is oriented to person, place, and time and well-developed, well-nourished, and in Joel distress. Joel distress.  HENT:  Head: Normocephalic.  Eyes: Joel scleral icterus.  Neck: Normal range of motion. Neck supple. Joel JVD present. Joel tracheal deviation present.  Cardiovascular: Normal rate, regular rhythm and normal heart sounds. Exam reveals Joel gallop and Joel friction rub.  Joel murmur heard. Pulmonary/Chest: Effort normal. Joel respiratory distress. He has wheezes. He has Joel rales. He exhibits Joel tenderness.  Abdominal: Soft. Bowel sounds are normal. He exhibits Joel distension and Joel mass. There is Joel tenderness. There is Joel rebound and Joel guarding.  Musculoskeletal: He exhibits Joel edema.  Left below knee amputation  Neurological: He is alert and oriented to person, place, and time.  Skin: Skin is warm. Joel rash noted. Joel erythema.  Psychiatric: Affect and judgment normal.      LABORATORY PANEL:   CBC Recent Labs  Lab 07/21/17 0613  WBC 9.7  HGB 13.5  HCT 41.9  PLT 175   ------------------------------------------------------------------------------------------------------------------  Chemistries  Recent Labs  Lab 07/21/17 0659  NA 132*  K 7.0*  CL 96*  CO2 22  GLUCOSE 91  BUN 86*  CREATININE 12.64*  CALCIUM 9.6  AST 31  ALT 30  ALKPHOS 140*  BILITOT 1.0   ------------------------------------------------------------------------------------------------------------------  Cardiac Enzymes Recent Labs  Lab 07/21/17 0659  TROPONINI 0.19*   ------------------------------------------------------------------------------------------------------------------  RADIOLOGY:  Ct Angio Chest Pe W Or Wo Contrast  Result Date: 07/21/2017 CLINICAL DATA:  Shortness of breath.   Tenderness around PermCath. EXAM: CT ANGIOGRAPHY CHEST WITH CONTRAST TECHNIQUE: Multidetector CT imaging of the chest was performed using the standard protocol during bolus administration of intravenous contrast. Multiplanar CT image reconstructions and MIPs were obtained to evaluate the vascular anatomy. CONTRAST:  40mL ISOVUE-370 IOPAMIDOL (ISOVUE-370) INJECTION 76% COMPARISON:  Chest x-ray dated 07/21/2017 and CT scan of the chest dated 08/03/2016 FINDINGS: Cardiovascular: There is a single segmental embolus in the anterior basal region of the right lower lobe as well as small subsegmental emboli in the right lower lobe. There is an adjacent peripheral area of lung consolidation in the right lower lobe with some calcifications. There is also adjacent moderate right pleural effusion. Chronic cardiomegaly, slightly increased. Extensive coronary artery calcification and aortic atherosclerosis. PermCath in good position. Mediastinum/Nodes: Joel enlarged mediastinal, hilar, or axillary lymph nodes. Thyroid gland, trachea, and esophagus demonstrate Joel significant findings. Lungs/Pleura: Moderate right pleural effusion. Focal area of consolidation in the right lower lobe with some parenchymal calcifications indicating that this is most likely a chronic process. This could represent acute on chronic pulmonary infarct. Slight atelectasis at the left lung base. Upper Abdomen: Joel acute abnormality. Musculoskeletal: Joel acute abnormality. Osteophytes fuse much of the mid and lower thoracic spine. Joel evidence of an abscess or soft tissue inflammation at the site of the PermCath in the left supraclavicular region. Review of the MIP images confirms the above findings. IMPRESSION: 1. Small pulmonary emboli in the right lower lobe with a focal area of consolidation and some parenchymal calcification suggesting that this is not an acute process or could be acute or chronic pulmonary infarct. 2. Moderate right pleural effusion. 3.  Chronic cardiomegaly, slightly increased. Coronary artery and aortic atherosclerosis. Electronically Signed   By: Lorriane Shire M.D.   On: 07/21/2017 09:07   Dg Chest Portable 1 View  Result Date: 07/21/2017 CLINICAL DATA:  Chest pain and shortness of breath.  Renal failure. EXAM: PORTABLE CHEST 1 VIEW COMPARISON:  June 22, 2017 FINDINGS: Central catheter tip is in the right atrium. Joel pneumothorax. There is a small right pleural effusion with bibasilar atelectatic change. The lungs elsewhere are clear. Heart is enlarged with pulmonary vascularity within normal limits. There is aortic atherosclerosis. Joel adenopathy.  Joel bone lesions. IMPRESSION: Small right pleural effusion with bibasilar atelectasis. Stable cardiomegaly. There is aortic atherosclerosis. Aortic Atherosclerosis (ICD10-I70.0). Electronically Signed   By: Lowella Grip III M.D.   On: 07/21/2017 07:01    EKG:  Normal sinus rhythm old left bundle-branch block Joel ST elevation or depression  IMPRESSION AND PLAN:   62 year old male with end-stage renal disease on hemodialysis who presents with shortness of breath.  1. Acute on chronic hypoxic respiratory failure: Patient normally wears 2 L of oxygen. CT scan shows old pulmonary emboli. Joel evidence of pneumonia. I suspect that increasing shortness of breath is due to acute COPD exacerbation with acute bronchitis.  2. Acute bronchitis with acute exacerbation of COPD: Continue IV steroids and add doxycycline for acute bronchitis. Continue inhalers and nebulizer treatments 3. Hyperkalemia: Patient will need emergent dialysis today. ED physician as artery spoken with nephrologist.  4. ESRD had hematemesis: Nephrology consultation has been requested  5. Elevated troponin: I suspect this is due to demand ischemia and poor renal clearance Continue to monitor  6.Tobacco dependence: Patient is encouraged to quit smoking. Counseling was provided for 4 minutes.  7. Chronic systolic and  diastolic heart failure without signs of exacerbation Continue to monitor intake and output and daily weight  8. Known pulmonary emboli: Continue anticoagulation with Eliquis  9. Essential hypertension/CAD: Continue Coreg and isosorbide Continue statin and aspirin   10. GERD: Continue PPI    All the records are reviewed and case discussed with ED provider. Management plans discussed with the patient and he in agreement  CODE STATUS: FULL  TOTAL TIME TAKING CARE OF THIS PATIENT: 44 minutes.    Marvon Shillingburg M.D on 07/21/2017 at 10:36 AM  Between 7am to 6pm - Pager - [phone]  After 6pm go to [email or web address] - password [email or web address] [facility]  Office  [phone]  CC: Primary care physician; Donnie Coffin, MD

## 2017-07-21 NOTE — ED Triage Notes (Signed)
Patient to Rm 9 via EMS from local nursing facility.  Patient was getting ready for dialysis and complained of increasing shortness of breath and pain at dialysis access site.  Patient reports both started Saturday night and became progressively worse.

## 2017-07-21 NOTE — ED Notes (Signed)
Pt delivered to dialysis; 2C is sending consent paperwork to them. Pt will go to floor after dialysis.

## 2017-07-22 ENCOUNTER — Inpatient Hospital Stay
Admission: EM | Disposition: A | Discharge status: Home / Self Care | Payer: Self-pay | Source: Home / Self Care | Attending: Internal Medicine

## 2017-07-22 DIAGNOSIS — T827XXA Infection and inflammatory reaction due to other cardiac and vascular devices, implants and grafts, initial encounter: Secondary | ICD-10-CM

## 2017-07-22 DIAGNOSIS — I10 Essential (primary) hypertension: Secondary | ICD-10-CM

## 2017-07-22 DIAGNOSIS — R0602 Shortness of breath: Secondary | ICD-10-CM

## 2017-07-22 DIAGNOSIS — I2510 Atherosclerotic heart disease of native coronary artery without angina pectoris: Secondary | ICD-10-CM

## 2017-07-22 HISTORY — PX: DIALYSIS/PERMA CATHETER REMOVAL: CATH118289

## 2017-07-22 LAB — BASIC METABOLIC PANEL
ANION GAP: 15 (ref 5–15)
BUN: 70 mg/dL — ABNORMAL HIGH (ref 6–20)
CALCIUM: 8.7 mg/dL — AB (ref 8.9–10.3)
CHLORIDE: 94 mmol/L — AB (ref 101–111)
CO2: 23 mmol/L (ref 22–32)
Creatinine, Ser: 10.32 mg/dL — ABNORMAL HIGH (ref 0.61–1.24)
GFR calc non Af Amer: 5 mL/min — ABNORMAL LOW (ref >60)
GFR, EST AFRICAN AMERICAN: 5 mL/min — AB (ref >60)
Glucose, Bld: 135 mg/dL — ABNORMAL HIGH (ref 65–99)
POTASSIUM: 6.1 mmol/L — AB (ref 3.5–5.1)
Sodium: 132 mmol/L — ABNORMAL LOW (ref 135–145)

## 2017-07-22 LAB — BLOOD GAS, VENOUS
ACID-BASE DEFICIT: 2.6 mmol/L — AB (ref 0.0–2.0)
BICARBONATE: 24.5 mmol/L (ref 20.0–28.0)
Patient temperature: 37
pCO2, Ven: 51 mmHg (ref 44.0–60.0)
pH, Ven: 7.29 (ref 7.250–7.430)

## 2017-07-22 LAB — CBC
HEMATOCRIT: 39.3 % — AB (ref 40.0–52.0)
HEMOGLOBIN: 12.7 g/dL — AB (ref 13.0–18.0)
MCH: 28.3 pg (ref 26.0–34.0)
MCHC: 32.2 g/dL (ref 32.0–36.0)
MCV: 87.8 fL (ref 80.0–100.0)
Platelets: 139 10*3/uL — ABNORMAL LOW (ref 150–440)
RBC: 4.48 MIL/uL (ref 4.40–5.90)
RDW: 18.3 % — ABNORMAL HIGH (ref 11.5–14.5)
WBC: 11.3 10*3/uL — ABNORMAL HIGH (ref 3.8–10.6)

## 2017-07-22 LAB — BLOOD CULTURE ID PANEL (REFLEXED)
Acinetobacter baumannii: NOT DETECTED
CANDIDA GLABRATA: NOT DETECTED
CANDIDA KRUSEI: NOT DETECTED
Candida albicans: NOT DETECTED
Candida parapsilosis: NOT DETECTED
Candida tropicalis: NOT DETECTED
ENTEROBACTER CLOACAE COMPLEX: NOT DETECTED
ENTEROCOCCUS SPECIES: NOT DETECTED
ESCHERICHIA COLI: NOT DETECTED
Enterobacteriaceae species: NOT DETECTED
Haemophilus influenzae: NOT DETECTED
Klebsiella oxytoca: NOT DETECTED
Klebsiella pneumoniae: NOT DETECTED
LISTERIA MONOCYTOGENES: NOT DETECTED
Methicillin resistance: NOT DETECTED
Neisseria meningitidis: NOT DETECTED
PROTEUS SPECIES: NOT DETECTED
Pseudomonas aeruginosa: NOT DETECTED
STAPHYLOCOCCUS SPECIES: DETECTED — AB
STREPTOCOCCUS AGALACTIAE: NOT DETECTED
Serratia marcescens: NOT DETECTED
Staphylococcus aureus (BCID): DETECTED — AB
Streptococcus pneumoniae: NOT DETECTED
Streptococcus pyogenes: NOT DETECTED
Streptococcus species: NOT DETECTED

## 2017-07-22 SURGERY — DIALYSIS/PERMA CATHETER REMOVAL

## 2017-07-22 MED ORDER — LIDOCAINE-EPINEPHRINE (PF) 1 %-1:200000 IJ SOLN
INTRAMUSCULAR | Status: DC | PRN
  Administered 2017-07-22: 20 mL via INTRADERMAL

## 2017-07-22 MED ORDER — CEFAZOLIN SODIUM-DEXTROSE 2-4 GM/100ML-% IV SOLN
2.0000 g | INTRAVENOUS | Status: DC
  Administered 2017-07-22: 2 g via INTRAVENOUS
  Filled 2017-07-22: qty 100

## 2017-07-22 MED ORDER — METHYLPREDNISOLONE SODIUM SUCC 40 MG IJ SOLR
40.0000 mg | INTRAMUSCULAR | Status: DC
  Administered 2017-07-22: 40 mg via INTRAVENOUS
  Filled 2017-07-22: qty 1

## 2017-07-22 MED ORDER — CEFAZOLIN SODIUM-DEXTROSE 1-4 GM/50ML-% IV SOLN
1.0000 g | INTRAVENOUS | Status: DC
  Administered 2017-07-22 – 2017-07-25 (×5): 1 g via INTRAVENOUS
  Filled 2017-07-22 (×4): qty 50

## 2017-07-22 MED ORDER — VANCOMYCIN HCL IN DEXTROSE 1-5 GM/200ML-% IV SOLN
1000.0000 mg | Freq: Once | INTRAVENOUS | Status: AC
  Administered 2017-07-22: 1000 mg via INTRAVENOUS
  Filled 2017-07-22: qty 200

## 2017-07-22 SURGICAL SUPPLY — 2 items
PREP CHG 10.5 TEAL (MISCELLANEOUS) ×3 IMPLANT
TRAY LACERAT/PLASTIC (MISCELLANEOUS) ×3 IMPLANT

## 2017-07-22 NOTE — Op Note (Signed)
  OPERATIVE NOTE   PROCEDURE: 1. Removal of a left IJ tunneled dialysis catheter  PRE-OPERATIVE DIAGNOSIS: Complication of dialysis catheter, End stage renal disease  POST-OPERATIVE DIAGNOSIS: Same  SURGEON: Hortencia Pilar, M.D.  ANESTHESIA: Local anesthetic with 1% lidocaine with epinephrine   ESTIMATED BLOOD LOSS: Minimal   FINDING(S): 1. Catheter intact   SPECIMEN(S):  Catheter  INDICATIONS:   [name] is a 62 y.o. male who presents with sepsis and positive blood cultures.  The patient has undergone placement of an extremity access which is working and this has been successfully cannulated without difficulty.  therefore is undergoing removal of his tunneled catheter which is no longer needed to avoid septic complications.   DESCRIPTION: After obtaining full informed written consent, the patient was positioned supine. The left IJ tunneled dialysis catheter and surrounding area is prepped and draped in a sterile fashion. The cuff was localized by palpation and noted to be less than 3 cm from the exit site. After appropriate timeout is called, 1% lidocaine with epinephrine is infiltrated into the surrounding tissues around the cuff. Small transverse incision is created at the exit site with an 11 blade scalpel and the dissection was carried up along the catheter to expose the cuff of the tunneled catheter.  The catheter cuff is then freed from the surrounding attachments and adhesions. Once the catheter has been freed circumferentially it is removed in 1 piece. Light pressure was held at the base of the neck.   Antibiotic ointment and a sterile dressing is applied to the exit site. Patient tolerated procedure well and there were no complications.  COMPLICATIONS: None  CONDITION: Unchanged  Hortencia Pilar, M.D. [facility] Office: [phone]  07/22/2017,4:53 PM

## 2017-07-22 NOTE — Progress Notes (Signed)
Pre HD Assessment  

## 2017-07-22 NOTE — Progress Notes (Signed)
HD Tx ended clotted system.

## 2017-07-22 NOTE — Progress Notes (Signed)
[facility] [place] NAME: [name]    MR#:  [id number]  DATE OF BIRTH:  08-27-1955  SUBJECTIVE:   SOB has improved  REVIEW OF SYSTEMS:    Review of Systems  Constitutional: Negative for fever, chills weight loss HENT: Negative for ear pain, nosebleeds, congestion, facial swelling, rhinorrhea, neck pain, neck stiffness and ear discharge.   Respiratory: Negative for cough, shortness of breath, wheezing  Cardiovascular: Negative for chest pain, palpitations and leg swelling.  Gastrointestinal: Negative for heartburn, abdominal pain, vomiting, diarrhea or consitpation Genitourinary: Negative for dysuria, urgency, frequency, hematuria Musculoskeletal: Negative for back pain or joint pain Neurological: Negative for dizziness, seizures, syncope, focal weakness,  numbness and headaches.  Hematological: Does not bruise/bleed easily.  Psychiatric/Behavioral: Negative for hallucinations, confusion, dysphoric mood    Tolerating Diet: yes      DRUG ALLERGIES:   Allergies  Allergen Reactions  . Dust Mite Extract Other (See Comments)    Reaction: unknown    VITALS:  Blood pressure 116/77, pulse 70, temperature 98.2 F (36.8 C), temperature source Oral, resp. rate 16, height 6\' 2"  (1.88 m), weight 85.5 kg (188 lb 7.9 oz), SpO2 96 %.  PHYSICAL EXAMINATION:  Constitutional: Appears well-developed and well-nourished. No distress. HENT: Normocephalic. [name] Oropharynx is clear and moist.  Eyes: Conjunctivae and EOM are normal. PERRLA, no scleral icterus.  Neck: Normal ROM. Neck supple. No JVD. No tracheal deviation. CVS: RRR, S1/S2 +, no murmurs, no gallops, no carotid bruit.  Pulmonary: Effort and breath sounds normal, no stridor, rhonchi, wheezes, rales.  Abdominal: Soft. BS +,  no distension, tenderness, rebound or guarding.  Musculoskeletal: Normal range of motion. No edema and no tenderness.  Neuro: Alert. CN 2-12 grossly intact. No  focal deficits. Skin: Skin is warm and dry. No rash noted. Psychiatric: Normal mood and affect.      LABORATORY PANEL:   CBC Recent Labs  Lab 07/22/17 0336  WBC 11.3*  HGB 12.7*  HCT 39.3*  PLT 139*   ------------------------------------------------------------------------------------------------------------------  Chemistries  Recent Labs  Lab 07/21/17 0659 07/22/17 0336  NA 132* 132*  K 7.0* 6.1*  CL 96* 94*  CO2 22 23  GLUCOSE 91 135*  BUN 86* 70*  CREATININE 12.64* 10.32*  CALCIUM 9.6 8.7*  AST 31  --   ALT 30  --   ALKPHOS 140*  --   BILITOT 1.0  --    ------------------------------------------------------------------------------------------------------------------  Cardiac Enzymes Recent Labs  Lab 07/21/17 0659  TROPONINI 0.19*   ------------------------------------------------------------------------------------------------------------------  RADIOLOGY:  Ct Angio Chest Pe W Or Wo Contrast  Result Date: 07/21/2017 CLINICAL DATA:  Shortness of breath.  Tenderness around PermCath. EXAM: CT ANGIOGRAPHY CHEST WITH CONTRAST TECHNIQUE: Multidetector CT imaging of the chest was performed using the standard protocol during bolus administration of intravenous contrast. Multiplanar CT image reconstructions and MIPs were obtained to evaluate the vascular anatomy. CONTRAST:  22mL ISOVUE-370 IOPAMIDOL (ISOVUE-370) INJECTION 76% COMPARISON:  Chest x-ray dated 07/21/2017 and CT scan of the chest dated 08/03/2016 FINDINGS: Cardiovascular: There is a single segmental embolus in the anterior basal region of the right lower lobe as well as small subsegmental emboli in the right lower lobe. There is an adjacent peripheral area of lung consolidation in the right lower lobe with some calcifications. There is also adjacent moderate right pleural effusion. Chronic cardiomegaly, slightly increased. Extensive coronary artery calcification and aortic atherosclerosis. PermCath in good  position. Mediastinum/Nodes: No enlarged mediastinal, hilar, or axillary lymph nodes.  Thyroid gland, trachea, and esophagus demonstrate no significant findings. Lungs/Pleura: Moderate right pleural effusion. Focal area of consolidation in the right lower lobe with some parenchymal calcifications indicating that this is most likely a chronic process. This could represent acute on chronic pulmonary infarct. Slight atelectasis at the left lung base. Upper Abdomen: No acute abnormality. Musculoskeletal: No acute abnormality. Osteophytes fuse much of the mid and lower thoracic spine. No evidence of an abscess or soft tissue inflammation at the site of the PermCath in the left supraclavicular region. Review of the MIP images confirms the above findings. IMPRESSION: 1. Small pulmonary emboli in the right lower lobe with a focal area of consolidation and some parenchymal calcification suggesting that this is not an acute process or could be acute or chronic pulmonary infarct. 2. Moderate right pleural effusion. 3. Chronic cardiomegaly, slightly increased. Coronary artery and aortic atherosclerosis. Electronically Signed   By: Lorriane Shire M.D.   On: 07/21/2017 09:07   Dg Chest Portable 1 View  Result Date: 07/21/2017 CLINICAL DATA:  Chest pain and shortness of breath.  Renal failure. EXAM: PORTABLE CHEST 1 VIEW COMPARISON:  June 22, 2017 FINDINGS: Central catheter tip is in the right atrium. No pneumothorax. There is a small right pleural effusion with bibasilar atelectatic change. The lungs elsewhere are clear. Heart is enlarged with pulmonary vascularity within normal limits. There is aortic atherosclerosis. No adenopathy. No bone lesions. IMPRESSION: Small right pleural effusion with bibasilar atelectasis. Stable cardiomegaly. There is aortic atherosclerosis. Aortic Atherosclerosis (ICD10-I70.0). Electronically Signed   By: Lowella Grip III M.D.   On: 07/21/2017 07:01     ASSESSMENT AND PLAN:    62 year old male with end-stage renal disease on hemodialysis who presents with shortness of breath.  1. Acute on chronic hypoxic respiratory failure: Patient normally wears 2 L of oxygen. CT scan shows old pulmonary emboli. No evidence of pneumonia. History shortness of breath has improved after dialysis. It appears that he did not get his full dialysis on Friday and this led to pulmonary edema which led to shortness of breath. He also had mild COPD exacerbation   2. Acute bronchitis with acute exacerbation of COPD: Wean IV steroids. He is on Unasyn for acute bronchitis Continue inhalers and nebulizer treatments 3. Hyperkalemia: Patient is planned to undergo hemodialysis again today due to hyperkalemia  4. Bacteremia: Patient will need his dialysis catheter out today Repeat blood cultures 2 ID and vascular consult requested   5. ESRD on dialysis: Management as per nephrology  6. Elevated troponin: This is due to demand ischemia with poor renal clearance. Patient is ruled out for ACS   7.Tobacco dependence: Patient is encouraged to quit smoking. Counseling was provided for 4 minutes.  8. Chronic systolic and diastolic heart failure without signs of exacerbation Continue to monitor intake and output and daily weight  9 Known pulmonary emboli: Continue anticoagulation with Eliquis  10. Essential hypertension/CAD: Continue Coreg and isosorbide Continue statin and aspirin   11. GERD: Continue PPI    D/w dr Sherlon Handing    Management plans discussed with the patient and he is in agreement.  CODE STATUS: full  TOTAL TIME TAKING CARE OF THIS PATIENT: 30 minutes.     POSSIBLE D/C 2-3 days, DEPENDING ON CLINICAL CONDITION.   Aroush Chasse M.D on 07/22/2017 at 9:24 AM  Between 7am to 6pm - Pager - [phone] After 6pm go to [email or web address] - password [email or web address] [facility]  Office  [phone]  CC: Primary care physician;  Donnie Coffin,  MD  Note: This dictation was prepared with Dragon dictation along with smaller phrase technology. Any transcriptional errors that result from this process are unintentional.

## 2017-07-22 NOTE — Clinical Social Work Note (Signed)
Clinical Social Work Assessment  Patient Details  Name: [name] MRN: [id number] Date of Birth: January 16, 1956  Date of referral:  07/22/17               Reason for consult:  Discharge Planning                Permission sought to share information with:  Facility Sport and exercise psychologist, Family Supports Permission granted to share information::  Yes, Verbal Permission Granted  Name::        Agency::     Relationship::     Contact Information:     Housing/Transportation Living arrangements for the past 2 months:  [place] of Information:  Patient Patient Interpreter Needed:  None Criminal Activity/Legal Involvement Pertinent to Current Situation/Hospitalization:  No - Comment as needed Significant Relationships:  Adult Children, Siblings Lives with:  Facility Resident Do you feel safe going back to the place where you live?  Yes Need for family participation in patient care:  No (Coment)  Care giving concerns:  Patient resides at [facility].    Social Worker assessment / plan:  CSW spoke with patient and introduced self and explained purpose of visit. Patient states that he enjoys living at [facility] and wishes to return at discharge. He named his sisters and daughter that are in the area and he gets to see them.   CSW spoke with Museum/gallery conservator at [facility] and they are able to take patient back when time.   Employment status:  Disabled (Comment on whether or not currently receiving Disability) Insurance information:  Medicare, Medicaid In [place] PT Recommendations:    Information / Referral to community resources:     Patient/Family's Response to care:  Patient expressed appreciation for CSW visit.  Patient/Family's Understanding of and Emotional Response to Diagnosis, Current Treatment, and Prognosis:  Patient is involved in his treatment plan.  Emotional Assessment Appearance:  Appears stated age Attitude/Demeanor/Rapport:  (pleasant and  cooperative) Affect (typically observed):  Calm Orientation:  Oriented to Self, Oriented to Place, Oriented to  Time, Oriented to Situation Alcohol / Substance use:  Not Applicable Psych involvement (Current and /or in the community):  No (Comment)  Discharge Needs  Concerns to be addressed:  Care Coordination Readmission within the last 30 days:  No Current discharge risk:  None Barriers to Discharge:  No Barriers Identified   Shela Leff, LCSW 07/22/2017, 1:54 PM

## 2017-07-22 NOTE — NC FL2 (Signed)
[place] LEVEL OF CARE SCREENING TOOL     IDENTIFICATION  Patient Name: [name] Birthdate: 12-09-1955 Sex: male Admission Date (Current Location): 07/21/2017  [place] and [place] Number:  Engineering geologist and Address:  [facility], [address] [place], [place] [address]      Provider Number: [id number]  Attending Physician Name and Address:  Bettey Costa, MD  Relative Name and Phone Number:       Current Level of Care: Hospital Recommended Level of Care: [place] Prior Approval Number:    Date Approved/Denied:   PASRR Number:    Discharge Plan: (ALF)    Current Diagnoses: Patient Active Problem List   Diagnosis Date Noted  . Apnea   . End-stage renal disease on hemodialysis ([place])   . Hypervolemia   . Respiratory distress   . Altered mental status   . Acute on chronic systolic CHF (congestive heart failure) ([place]) 01/18/2017  . Respiratory failure with hypoxia ([place]) 11/11/2016  . Acute respiratory failure ([place]) 10/28/2016  . Pulmonary embolism ([place]) 08/03/2016  . NSTEMI (non-ST elevated myocardial infarction) ([place]) 07/26/2016  . Acute respiratory failure with hypoxia ([place]) 05/24/2016  . Right lower lobe pneumonia ([place]) 05/24/2016  . Elevated troponin 05/24/2016  . Pulmonary edema 05/21/2016  . Kidney dialysis as the cause of abnormal reaction of the patient, or of later complication, without mention of misadventure at the time of the procedure (CODE) 03/05/2016  . PVD (peripheral vascular disease) ([place]) 03/05/2016  . Preop examination 05/02/2015  . Chronic systolic heart failure ([place])   . Sepsis ([place]) 01/13/2015  . Acute encephalopathy 01/13/2015  . Hyperkalemia 01/13/2015  . Gangrene of foot ([place]) 05/04/2014  . ESRD (end stage renal disease) on dialysis ([place]) 05/04/2014  . CAD (coronary artery disease) 05/04/2014  . Normocytic anemia 05/04/2014  . Hyperlipidemia 04/26/2010  . HYPERTENSION,  BENIGN 04/26/2010  . CAD, NATIVE VESSEL 04/26/2010    Orientation RESPIRATION BLADDER Height & Weight     Self, Time, Situation, Place  Normal Continent Weight: 188 lb 7.9 oz (85.5 kg) Height:  6\' 2"  (188 cm)  BEHAVIORAL SYMPTOMS/MOOD NEUROLOGICAL BOWEL NUTRITION STATUS  (none) (none) Continent Diet(renal)  AMBULATORY STATUS COMMUNICATION OF NEEDS Skin   Limited Assist Verbally Normal                       Personal Care Assistance Level of Assistance  Dressing, Bathing, Feeding Bathing Assistance: Independent Feeding assistance: Independent Dressing Assistance: Independent     Functional Limitations Info  (no issues)          SPECIAL CARE FACTORS FREQUENCY  PT (By licensed PT)(outpatient hemodialysis)                    Contractures Contractures Info: Not present    Additional Factors Info  Code Status Code Status Info: full             Current Medications (07/22/2017):  This is the current hospital active medication list Current Facility-Administered Medications  Medication Dose Route Frequency Provider Last Rate Last Dose  . 0.9 %  sodium chloride infusion  250 mL Intravenous PRN Bettey Costa, MD      . acetaminophen (TYLENOL) tablet 500 mg  500 mg Oral Q6H PRN Mody, Sital, MD      . albuterol (PROVENTIL) (2.5 MG/3ML) 0.083% nebulizer solution 2.5 mg  2.5 mg Nebulization Q6H PRN Bettey Costa, MD      .  apixaban (ELIQUIS) tablet 5 mg  5 mg Oral BID Bettey Costa, MD   5 mg at 07/22/17 0944  . aspirin EC tablet 325 mg  325 mg Oral Daily Bettey Costa, MD   325 mg at 07/22/17 0944  . atorvastatin (LIPITOR) tablet 10 mg  10 mg Oral QHS Bettey Costa, MD   10 mg at 07/21/17 2041  . bisacodyl (DULCOLAX) EC tablet 5 mg  5 mg Oral Daily PRN Bettey Costa, MD      . calcium acetate (PHOSLO) capsule 667 mg  667 mg Oral Q breakfast Bettey Costa, MD   667 mg at 07/22/17 0737  . carvedilol (COREG) tablet 3.125 mg  3.125 mg Oral BID WC Bettey Costa, MD   Stopped at 07/22/17  [phone]  . ceFAZolin (ANCEF) IVPB 1 g/50 mL premix  1 g Intravenous Q24H Hallaji, Sheema M, RPH      . docusate sodium (COLACE) capsule 100 mg  100 mg Oral Daily PRN Bettey Costa, MD      . guaifenesin (ROBITUSSIN) 100 MG/5ML syrup 200 mg  200 mg Oral TID PRN Bettey Costa, MD      . hydrALAZINE (APRESOLINE) injection 10 mg  10 mg Intravenous Q6H PRN Mody, Sital, MD      . ipratropium-albuterol (DUONEB) 0.5-2.5 (3) MG/3ML nebulizer solution 3 mL  3 mL Nebulization Q6H PRN Mody, Sital, MD      . isosorbide mononitrate (IMDUR) 24 hr tablet 30 mg  30 mg Oral Daily Bettey Costa, MD   Stopped at 07/22/17 0945  . ketorolac (TORADOL) 30 MG/ML injection 30 mg  30 mg Intravenous Q6H PRN Mody, Sital, MD      . loratadine (CLARITIN) tablet 10 mg  10 mg Oral Daily Bettey Costa, MD   10 mg at 07/22/17 0945  . MEDLINE mouth rinse  15 mL Mouth Rinse BID Bettey Costa, MD   15 mL at 07/22/17 0946  . methylPREDNISolone sodium succinate (SOLU-MEDROL) 40 mg/mL injection 40 mg  40 mg Intravenous Q24H Bettey Costa, MD   40 mg at 07/22/17 0946  . midodrine (PROAMATINE) tablet 5 mg  5 mg Oral BID Bettey Costa, MD   5 mg at 07/22/17 0947  . mometasone-formoterol (DULERA) 200-5 MCG/ACT inhaler 2 puff  2 puff Inhalation BID Bettey Costa, MD   2 puff at 07/22/17 0742  . multivitamin with minerals tablet 1 tablet  1 tablet Oral BID Bettey Costa, MD   1 tablet at 07/22/17 0947  . nitroGLYCERIN (NITROSTAT) SL tablet 0.4 mg  0.4 mg Sublingual Q5 min PRN Bettey Costa, MD      . ondansetron (ZOFRAN) tablet 4 mg  4 mg Oral Q6H PRN Mody, Sital, MD       Or  . ondansetron (ZOFRAN) injection 4 mg  4 mg Intravenous Q6H PRN Mody, Sital, MD      . pantoprazole (PROTONIX) EC tablet 40 mg  40 mg Oral Daily Bettey Costa, MD   40 mg at 07/22/17 0947  . polyethylene glycol (MIRALAX / GLYCOLAX) packet 17 g  17 g Oral Daily PRN Mody, Sital, MD      . polyethylene glycol (MIRALAX / GLYCOLAX) packet 17 g  17 g Oral Daily PRN Mody, Sital, MD      . pregabalin  (LYRICA) capsule 75 mg  75 mg Oral Daily Bettey Costa, MD   75 mg at 07/22/17 0948  . sevelamer carbonate (RENVELA) tablet 800 mg  800 mg Oral TID WC Bettey Costa, MD  800 mg at 07/22/17 1331  . sodium chloride flush (NS) 0.9 % injection 3 mL  3 mL Intravenous Q12H Mody, Sital, MD   3 mL at 07/22/17 1332  . sodium chloride flush (NS) 0.9 % injection 3 mL  3 mL Intravenous PRN Bettey Costa, MD      . tiotropium (SPIRIVA) inhalation capsule 18 mcg  18 mcg Inhalation Daily Bettey Costa, MD   18 mcg at 07/22/17 0739  . traMADol (ULTRAM) tablet 50 mg  50 mg Oral Q6H PRN Bettey Costa, MD         Discharge Medications: Please see discharge summary for a list of discharge medications.  Relevant Imaging Results:  Relevant Lab Results:   Additional Information    Shela Leff, LCSW

## 2017-07-22 NOTE — Care Management (Signed)
Amanda Morris dialysis liaison notified of admission.    

## 2017-07-22 NOTE — Consult Note (Signed)
[facility]     Reason for Consult:MSSA bacteremia    Referring Physician: Bobetta Lime Date of Admission:  07/21/2017   Active Problems:   Hyperkalemia   HPI: [name] is a 62 y.o. male admitted with SOB and found to have pain at his L chest wall tunneled Permacath site.  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  L IJ cath removed 2/26. Has functioning UE AVF.    Past Medical History:  Diagnosis Date  . 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 Amputee, below knee, left ([place])   . Anemia   . Asthma   . Cardiomyopathy ([place])   . CHF (congestive heart failure) ([employer])   . Chronic systolic heart failure ([place])   . Complication of anesthesia    hypotension  . COPD (chronic obstructive pulmonary disease) ([place])   . Coronary artery disease   . Dialysis patient ([place])    Mon, Wed, Fri  . End stage renal disease ([place])   . GERD (gastroesophageal reflux disease)   . Headache   . History of kidney stones   . History of pulmonary embolism   . HLD (hyperlipidemia)   . HTN (hypertension)   . Hyperparathyroidism   . Myocardial infarction ([place])   . Peripheral vascular disease ([place])   . Shortness of breath dyspnea   . Sleep apnea    NO C-PAP, Patient stated in process of  "getting one"   . Tobacco dependence    Past Surgical History:  Procedure Laterality Date  . AMPUTATION Left 05/06/2014   Procedure: AMPUTATION BELOW KNEE;  Surgeon: Elam Dutch, MD;  Location: [place];  Service: Vascular;  Laterality: Left;  . AMPUTATION Right 01/12/2015   Procedure: Foot transmetatarsal amputation;  Surgeon: Algernon Huxley, MD;  Location: [facility];  Service: Vascular;  Laterality: Right;  . APPLICATION OF WOUND VAC Right 03/01/2015   Procedure: Application of Bio-connekt graft  and wound vac application to right foot ;  Surgeon: Algernon Huxley, MD;  Location: [facility];  Service: Vascular;  Laterality: Right;  . AV FISTULA PLACEMENT Left   . AV FISTULA PLACEMENT Left 11/28/2016   Procedure: ARTERIOVENOUS (AV) FISTULA CREATION;  Surgeon: Algernon Huxley, MD;  Location: [facility];  Service: Vascular;  Laterality: Left;  . CARDIAC CATHETERIZATION     stent placement   . CORONARY ANGIOPLASTY    . DIALYSIS/PERMA CATHETER INSERTION N/A 07/31/2016   Procedure: Dialysis/Perma Catheter Insertion;  Surgeon: Algernon Huxley, MD;  Location: [place] [facility];  Service: Cardiovascular;  Laterality: N/A;  . IR FLUORO GUIDE CV LINE LEFT  04/10/2017  . LIGATION OF ARTERIOVENOUS  FISTULA Right 01/31/2016   Procedure: LIGATION OF ARTERIOVENOUS  FISTULA;  Surgeon: Algernon Huxley, MD;  Location: [facility];  Service: Vascular;  Laterality: Right;  . PERIPHERAL VASCULAR CATHETERIZATION Right 12/15/2014   Procedure: Lower Extremity Angiography;  Surgeon: Algernon Huxley, MD;  Location: [place] [facility];  Service: Cardiovascular;  Laterality: Right;  . PERIPHERAL VASCULAR CATHETERIZATION  12/15/2014   Procedure: Lower Extremity Intervention;  Surgeon: Algernon Huxley, MD;  Location: [place] [facility];  Service: Cardiovascular;;  . PERIPHERAL VASCULAR CATHETERIZATION Right 08/14/2015   Procedure: A/V Shuntogram/Fistulagram;  Surgeon: Algernon Huxley, MD;  Location: [facility]  INVASIVE CV LAB;  Service: Cardiovascular;  Laterality: Right;  . PERIPHERAL VASCULAR CATHETERIZATION N/A 08/14/2015   Procedure: A/V Shunt Intervention;  Surgeon: Algernon Huxley, MD;  Location: [place] [facility];  Service: Cardiovascular;  Laterality: N/A;  . PERIPHERAL VASCULAR CATHETERIZATION N/A 01/11/2016   Procedure: Dialysis/Perma Catheter Insertion;  Surgeon: Algernon Huxley, MD;  Location: [place] [facility];  Service: Cardiovascular;  Laterality: N/A;  . REVISON OF ARTERIOVENOUS FISTULA Right 02/17/2016   Procedure: removal of AV fistula;   Surgeon: Serafina Mitchell, MD;  Location: [facility];  Service: Vascular;  Laterality: Right;  . REVISON OF ARTERIOVENOUS FISTULA Right 01/31/2016   Procedure: REVISON OF ARTERIOVENOUS FISTULA ( BRACHIOCEPHALIC ) W/ ARTEGRAFT;  Surgeon: Algernon Huxley, MD;  Location: [facility];  Service: Vascular;  Laterality: Right;  . TRANSMETATARSAL AMPUTATION Right 05/04/2015   Procedure: TRANSMETATARSAL AMPUTATION REVISION, great toe amputation;  Surgeon: Algernon Huxley, MD;  Location: [facility];  Service: Vascular;  Laterality: Right;   Social History   Tobacco Use  . Smoking status: Light Tobacco Smoker    Packs/day: 0.25    Years: 30.00    Pack years: 7.50    Types: Cigarettes  . Smokeless tobacco: Never Used  . Tobacco comment: 5  Substance Use Topics  . Alcohol use: No    Alcohol/week: 0.0 oz  . Drug use: No    Comment: has used crack cocaine in past    Family History  Problem Relation Age of Onset  . Leukemia Mother   . Heart attack Father   . Heart failure Other   . Hypertension Other   . Leukemia Other   . Diabetes Other   . Prostate cancer Neg Hx   . Kidney cancer Neg Hx   . Bladder Cancer Neg Hx     Allergies:  Allergies  Allergen Reactions  . Dust Mite Extract Other (See Comments)    Reaction: unknown    Current antibiotics: Antibiotics Given (last 72 hours)    Date/Time Action Medication Dose Rate   07/21/17 0956 New Bag/Given   levofloxacin (LEVAQUIN) IVPB 750 mg 750 mg 100 mL/hr   07/21/17 2040 Given   doxycycline (VIBRA-TABS) tablet 100 mg 100 mg    07/22/17 0332 New Bag/Given   ceFAZolin (ANCEF) IVPB 2g/100 mL premix 2 g 200 mL/hr   07/22/17 1130 New Bag/Given   vancomycin (VANCOCIN) IVPB 1000 mg/200 mL premix 1,000 mg 200 mL/hr      MEDICATIONS: . apixaban  5 mg Oral BID  . aspirin EC  325 mg Oral Daily  . atorvastatin  10 mg Oral QHS  . calcium acetate  667 mg Oral Q breakfast  . carvedilol  3.125 mg Oral BID WC  . isosorbide mononitrate  30 mg Oral Daily  .  loratadine  10 mg Oral Daily  . mouth rinse  15 mL Mouth Rinse BID  . methylPREDNISolone (SOLU-MEDROL) injection  40 mg Intravenous Q24H  . midodrine  5 mg Oral BID  . mometasone-formoterol  2 puff Inhalation BID  . multivitamin with minerals  1 tablet Oral BID  . pantoprazole  40 mg Oral Daily  . pregabalin  75 mg Oral Daily  . sevelamer carbonate  800 mg Oral TID WC  . sodium chloride flush  3 mL Intravenous Q12H  . tiotropium  18 mcg Inhalation Daily    Review of Systems - 11 systems reviewed and negative per HPI   OBJECTIVE: Temp:  [97.7 F (36.5 C)-98.4  F (36.9 C)] 98.4 F (36.9 C) (02/26 0927) Pulse Rate:  [64-78] 73 (02/26 1202) Resp:  [11-20] 12 (02/26 1202) BP: (79-126)/(64-85) 94/68 (02/26 1202) SpO2:  [88 %-100 %] 91 % (02/26 1202) Weight:  [85.5 kg (188 lb 7.9 oz)-87.7 kg (193 lb 5.5 oz)] 85.5 kg (188 lb 7.9 oz) (02/26 8413) Physical Exam  Constitutional: He is oriented to person, place, and time. He appears well-developed and well-nourished. No distress.  HENT:  Mouth/Throat: Oropharynx is clear and moist. No oropharyngeal exudate.  L ant chest wall with tunneled prior IJ cath site with mild TTP Cardiovascular: Normal rate, regular rhythm and normal heart sounds.  Pulmonary/Chest: Effort normal and breath sounds normal. No respiratory distress. He has no wheezes.  Abdominal: Soft. Bowel sounds are normal. He exhibits no distension. There is no tenderness.  Lymphadenopathy:  He has no cervical adenopathy.  Neurological: He is alert and oriented to person, place, and time.  Skin: Skin is warm and dry. No rash noted. No erythema.  Psychiatric: He has a normal mood and affect. His behavior is normal.     LABS: Results for orders placed or performed during the hospital encounter of 07/21/17 (from the past 48 hour(s))  CBC with Differential/Platelet     Status: Abnormal   Collection Time: 07/21/17  6:13 AM  Result Value Ref Range   WBC 9.7 3.8 - 10.6 K/uL   RBC  4.78 4.40 - 5.90 MIL/uL   Hemoglobin 13.5 13.0 - 18.0 g/dL   HCT 41.9 40.0 - 52.0 %   MCV 87.8 80.0 - 100.0 fL   MCH 28.3 26.0 - 34.0 pg   MCHC 32.2 32.0 - 36.0 g/dL   RDW 19.3 (H) 11.5 - 14.5 %   Platelets 175 150 - 440 K/uL   Neutrophils Relative % 62 %   Neutro Abs 6.0 1.4 - 6.5 K/uL   Lymphocytes Relative 20 %   Lymphs Abs 1.9 1.0 - 3.6 K/uL   Monocytes Relative 17 %   Monocytes Absolute 1.6 (H) 0.2 - 1.0 K/uL   Eosinophils Relative 2 %   Eosinophils Absolute 0.1 0 - 0.7 K/uL   Basophils Relative 1 %   Basophils Absolute 0.1 0 - 0.1 K/uL    Comment: Performed at [facility], [address] [place], [place] [address]  Influenza panel by PCR (type A & B)     Status: None   Collection Time: 07/21/17  6:26 AM  Result Value Ref Range   Influenza A By PCR NEGATIVE NEGATIVE   Influenza B By PCR NEGATIVE NEGATIVE    Comment: (NOTE) The Xpert Xpress Flu assay is intended as an aid in the diagnosis of  influenza and should not be used as a sole basis for treatment.  This  assay is FDA approved for nasopharyngeal swab specimens only. Nasal  washings and aspirates are unacceptable for Xpert Xpress Flu testing. Performed at [facility], [address] [place], [place] [address]   Comprehensive metabolic panel     Status: Abnormal   Collection Time: 07/21/17  6:59 AM  Result Value Ref Range   Sodium 132 (L) 135 - 145 mmol/L   Potassium 7.0 (HH) 3.5 - 5.1 mmol/L    Comment: CRITICAL RESULT CALLED TO, READ BACK BY AND VERIFIED WITH MARY NEEDHAM AT 3664 07/21/17 DAS    Chloride 96 (L) 101 - 111 mmol/L   CO2 22 22 - 32 mmol/L   Glucose, Bld 91 65 - 99 mg/dL  BUN 86 (H) 6 - 20 mg/dL   Creatinine, Ser 12.64 (H) 0.61 - 1.24 mg/dL   Calcium 9.6 8.9 - 10.3 mg/dL   Total Protein 9.7 (H) 6.5 - 8.1 g/dL   Albumin 3.6 3.5 - 5.0 g/dL   AST 31 15 - 41 U/L   ALT 30 17 - 63 U/L   Alkaline Phosphatase 140 (H) 38 - 126 U/L   Total Bilirubin 1.0 0.3 - 1.2 mg/dL   GFR  calc non Af Amer 4 (L) >60 mL/min   GFR calc Af Amer 4 (L) >60 mL/min    Comment: (NOTE) The eGFR has been calculated using the CKD EPI equation. This calculation has not been validated in all clinical situations. eGFR's persistently <60 mL/min signify possible Chronic Kidney Disease.    Anion gap 14 5 - 15    Comment: Performed at [facility], [address] [place], [place] [address]  Troponin I     Status: Abnormal   Collection Time: 07/21/17  6:59 AM  Result Value Ref Range   Troponin I 0.19 (HH) <0.03 ng/mL    Comment: CRITICAL RESULT CALLED TO, READ BACK BY AND VERIFIED WITH [facility] NEEDHAM AT 9458 07/21/17 DAS Performed at [facility], [address] [place], [place] [address]   Brain natriuretic peptide     Status: Abnormal   Collection Time: 07/21/17  6:59 AM  Result Value Ref Range   B Natriuretic Peptide >4,500.0 (H) 0.0 - 100.0 pg/mL    Comment: RESULT CONFIRMED BY MANUAL DILUTION DAS Performed at [facility], [address] [place], [place] [address]   Lactic acid, plasma     Status: None   Collection Time: 07/21/17  7:39 AM  Result Value Ref Range   Lactic Acid, Venous 1.5 0.5 - 1.9 mmol/L    Comment: Performed at [facility], [address] [place], [place] [address]  Blood gas, venous     Status: Abnormal   Collection Time: 07/21/17  7:39 AM  Result Value Ref Range   pH, Ven 7.29 7.250 - 7.430   pCO2, Ven 51 44.0 - 60.0 mmHg   Bicarbonate 24.5 20.0 - 28.0 mmol/L   Acid-base deficit 2.6 (H) 0.0 - 2.0 mmol/L   Patient temperature 37.0    Collection site LINE    Sample type VENOUS     Comment: Performed at [facility], [address] [place], [place] [address]  Blood culture (routine x 2)     Status: None (Preliminary result)   Collection Time: 07/21/17  9:13 AM  Result Value Ref Range   Specimen Description      BLOOD RIGHT ARM Performed at [facility], [address] [place], [place]  [address]    Special Requests      BOTTLES DRAWN AEROBIC AND ANAEROBIC Blood Culture adequate volume Performed at [facility], [address] [place], [place] [address]    Culture  Setup Time      Organism ID to follow [place] CRITICAL RESULT CALLED TO, READ BACK BY AND VERIFIED WITH: Taquilla Downum BESANTI ON 07/22/17 AT [place] Performed at [facility], [place] [address] [place], [place] [address]    Culture GRAM POSITIVE COCCI    Report Status PENDING   Blood culture (routine x 2)     Status: None (Preliminary result)   Collection Time: 07/21/17  9:13 AM  Result Value Ref Range   Specimen Description      BLOOD RIGHT [facility]  Performed at [facility], [address] [place], [place] [address]    Special Requests      BOTTLES DRAWN AEROBIC AND ANAEROBIC Blood Culture adequate volume Performed at [facility], [address] [place], [place] [address]    Culture  Setup Time      GRAM POSITIVE COCCI IN BOTH AEROBIC AND ANAEROBIC BOTTLES CRITICAL VALUE NOTED.  VALUE IS CONSISTENT WITH PREVIOUSLY REPORTED AND CALLED VALUE. JAG GRAM STAIN REVIEWED-AGREE WITH RESULT Performed at [facility], [place] [address], [place], [place] [address]    Culture GRAM POSITIVE COCCI    Report Status PENDING   Blood Culture ID Panel (Reflexed)     Status: Abnormal   Collection Time: 07/21/17  9:13 AM  Result Value Ref Range   Enterococcus species NOT DETECTED NOT DETECTED   Listeria monocytogenes NOT DETECTED NOT DETECTED   Staphylococcus species DETECTED (A) NOT DETECTED    Comment: CRITICAL RESULT CALLED TO, READ BACK BY AND VERIFIED WITH: Karlene Southard BESANTI ON 07/22/17 AT 0012 BY JAG    Staphylococcus aureus DETECTED (A) NOT DETECTED    Comment: Methicillin (oxacillin) susceptible Staphylococcus aureus (MSSA). Preferred therapy is anti staphylococcal beta lactam antibiotic  (Cefazolin or Nafcillin), unless clinically contraindicated. CRITICAL RESULT CALLED TO, READ BACK BY AND VERIFIED WITH: Natallie [place] BESANTI ON 07/22/17 AT 0012 BY JAG    Methicillin resistance NOT DETECTED NOT DETECTED   Streptococcus species NOT DETECTED NOT DETECTED   Streptococcus agalactiae NOT DETECTED NOT DETECTED   Streptococcus pneumoniae NOT DETECTED NOT DETECTED   Streptococcus pyogenes NOT DETECTED NOT DETECTED   Acinetobacter baumannii NOT DETECTED NOT DETECTED   Enterobacteriaceae species NOT DETECTED NOT DETECTED   Enterobacter cloacae complex NOT DETECTED NOT DETECTED   Escherichia coli NOT DETECTED NOT DETECTED   Klebsiella oxytoca NOT DETECTED NOT DETECTED   Klebsiella pneumoniae NOT DETECTED NOT DETECTED   Proteus species NOT DETECTED NOT DETECTED   Serratia marcescens NOT DETECTED NOT DETECTED   Haemophilus influenzae NOT DETECTED NOT DETECTED   Neisseria meningitidis NOT DETECTED NOT DETECTED   Pseudomonas aeruginosa NOT DETECTED NOT DETECTED   Candida albicans NOT DETECTED NOT DETECTED   Candida glabrata NOT DETECTED NOT DETECTED   Candida krusei NOT DETECTED NOT DETECTED   Candida parapsilosis NOT DETECTED NOT DETECTED   Candida tropicalis NOT DETECTED NOT DETECTED    Comment: Performed at [facility], [address] [place], [place] [address]  Protime-INR     Status: Abnormal   Collection Time: 07/21/17 10:20 AM  Result Value Ref Range   Prothrombin Time 18.2 (H) 11.4 - 15.2 seconds   INR 1.52     Comment: Performed at [facility], [address] [place], [place] [address]  Lactic acid, plasma     Status: None   Collection Time: 07/21/17  6:53 PM  Result Value Ref Range   Lactic Acid, Venous 1.4 0.5 - 1.9 mmol/L    Comment: Performed at [facility], [address] [place], [place] [address]  Basic metabolic panel     Status: Abnormal   Collection Time: 07/22/17  3:36 AM  Result Value Ref Range   Sodium 132 (L) 135 - 145  mmol/L   Potassium 6.1 (H) 3.5 - 5.1 mmol/L   Chloride 94 (L) 101 - 111 mmol/L   CO2 23 22 - 32 mmol/L   Glucose, Bld 135 (  H) 65 - 99 mg/dL   BUN 70 (H) 6 - 20 mg/dL   Creatinine, Ser 10.32 (H) 0.61 - 1.24 mg/dL   Calcium 8.7 (L) 8.9 - 10.3 mg/dL   GFR calc non Af Amer 5 (L) >60 mL/min   GFR calc Af Amer 5 (L) >60 mL/min    Comment: (NOTE) The eGFR has been calculated using the CKD EPI equation. This calculation has not been validated in all clinical situations. eGFR's persistently <60 mL/min signify possible Chronic Kidney Disease.    Anion gap 15 5 - 15    Comment: Performed at [facility], [address] [place], [place] [address]  CBC     Status: Abnormal   Collection Time: 07/22/17  3:36 AM  Result Value Ref Range   WBC 11.3 (H) 3.8 - 10.6 K/uL   RBC 4.48 4.40 - 5.90 MIL/uL   Hemoglobin 12.7 (L) 13.0 - 18.0 g/dL   HCT 39.3 (L) 40.0 - 52.0 %   MCV 87.8 80.0 - 100.0 fL   MCH 28.3 26.0 - 34.0 pg   MCHC 32.2 32.0 - 36.0 g/dL   RDW 18.3 (H) 11.5 - 14.5 %   Platelets 139 (L) 150 - 440 K/uL    Comment: Performed at [facility], [address] [place], [place] [address]   No components found for: ESR, C REACTIVE PROTEIN MICRO: Recent Results (from the past 720 hour(s))  MRSA PCR Screening     Status: None   Collection Time: 06/22/17 10:57 PM  Result Value Ref Range Status   MRSA by PCR NEGATIVE NEGATIVE Final    Comment:        The GeneXpert MRSA Assay (FDA approved for NASAL specimens only), is one component of a comprehensive MRSA colonization surveillance program. It is not intended to diagnose MRSA infection nor to guide or monitor treatment for MRSA infections. Performed at [facility], [address] [place], [place] [address]   Blood culture (routine x 2)     Status: None (Preliminary result)   Collection Time: 07/21/17  9:13 AM  Result Value Ref Range Status   Specimen Description   Final    BLOOD RIGHT ARM Performed at  [facility], [address], [place], [place] [address]    Special Requests   Final    BOTTLES DRAWN AEROBIC AND ANAEROBIC Blood Culture adequate volume Performed at [facility], [address] [place], [place] [address]    Culture  Setup Time   Final    Organism ID to follow GRAM POSITIVE COCCI IN BOTH AEROBIC AND ANAEROBIC BOTTLES CRITICAL RESULT CALLED TO, READ BACK BY AND VERIFIED WITH: Briceyda Abdullah BESANTI ON 07/22/17 AT 0012 JAG GRAM STAIN REVIEWED-AGREE WITH RESULT Performed at [facility], [place] [address], [place], [place] [address]    Culture GRAM POSITIVE COCCI  Final   Report Status PENDING  Incomplete  Blood culture (routine x 2)     Status: None (Preliminary result)   Collection Time: 07/21/17  9:13 AM  Result Value Ref Range Status   Specimen Description   Final    BLOOD RIGHT [facility]  Performed at [facility], [address], [place],  [address]    Special Requests   Final    BOTTLES DRAWN AEROBIC AND ANAEROBIC Blood Culture adequate volume Performed at [facility], [address], [place],  [address]    Culture  Setup Time   Final    Lonell Grandchild  POSITIVE COCCI IN BOTH AEROBIC AND ANAEROBIC BOTTLES CRITICAL VALUE NOTED.  VALUE IS CONSISTENT WITH PREVIOUSLY REPORTED AND CALLED VALUE. JAG GRAM STAIN REVIEWED-AGREE WITH RESULT Performed at [facility], [place] [address], [place], [place] [address]    Culture GRAM POSITIVE COCCI  Final   Report Status PENDING  Incomplete  Blood Culture ID Panel (Reflexed)     Status: Abnormal   Collection Time: 07/21/17  9:13 AM  Result Value Ref Range Status   Enterococcus species NOT DETECTED NOT DETECTED Final   Listeria monocytogenes NOT DETECTED NOT DETECTED Final   Staphylococcus species DETECTED (A) NOT DETECTED Final    Comment: CRITICAL RESULT CALLED TO, READ BACK BY AND VERIFIED WITH: Sharnette Kitamura BESANTI ON 07/22/17 AT 0012 BY JAG    Staphylococcus aureus DETECTED (A) NOT  DETECTED Final    Comment: Methicillin (oxacillin) susceptible Staphylococcus aureus (MSSA). Preferred therapy is anti staphylococcal beta lactam antibiotic (Cefazolin or Nafcillin), unless clinically contraindicated. CRITICAL RESULT CALLED TO, READ BACK BY AND VERIFIED WITH: Rayyan Orsborn BESANTI ON 07/22/17 AT 0012 BY JAG    Methicillin resistance NOT DETECTED NOT DETECTED Final   Streptococcus species NOT DETECTED NOT DETECTED Final   Streptococcus agalactiae NOT DETECTED NOT DETECTED Final   Streptococcus pneumoniae NOT DETECTED NOT DETECTED Final   Streptococcus pyogenes NOT DETECTED NOT DETECTED Final   Acinetobacter baumannii NOT DETECTED NOT DETECTED Final   Enterobacteriaceae species NOT DETECTED NOT DETECTED Final   Enterobacter cloacae complex NOT DETECTED NOT DETECTED Final   Escherichia coli NOT DETECTED NOT DETECTED Final   Klebsiella oxytoca NOT DETECTED NOT DETECTED Final   Klebsiella pneumoniae NOT DETECTED NOT DETECTED Final   Proteus species NOT DETECTED NOT DETECTED Final   Serratia marcescens NOT DETECTED NOT DETECTED Final   Haemophilus influenzae NOT DETECTED NOT DETECTED Final   Neisseria meningitidis NOT DETECTED NOT DETECTED Final   Pseudomonas aeruginosa NOT DETECTED NOT DETECTED Final   Candida albicans NOT DETECTED NOT DETECTED Final   Candida glabrata NOT DETECTED NOT DETECTED Final   Candida krusei NOT DETECTED NOT DETECTED Final   Candida parapsilosis NOT DETECTED NOT DETECTED Final   Candida tropicalis NOT DETECTED NOT DETECTED Final    Comment: Performed at [facility], [address] [place], [place] [address]    IMAGING: Dg Chest 2 View  Result Date: 06/22/2017 CLINICAL DATA:  Patient with shortness of breath. Nonproductive cough. EXAM: CHEST  2 VIEW COMPARISON:  Chest radiograph 04/07/2017 FINDINGS: Central venous catheter tip projects over the right atrium. Monitoring leads overlie the patient. Stable marked cardiomegaly. Aortic  atherosclerosis. Small right pleural effusion. Pulmonary vascular redistribution. Mid lower lung heterogeneous pulmonary opacities. Thoracic spine degenerative changes. IMPRESSION: Cardiomegaly with mild interstitial opacities favored to represent edema. Small right pleural effusion. Electronically Signed   By: Lovey Newcomer M.D.   On: 06/22/2017 17:54   Ct Angio Chest Pe W Or Wo Contrast  Result Date: 07/21/2017 CLINICAL DATA:  Shortness of breath.  Tenderness around PermCath. EXAM: CT ANGIOGRAPHY CHEST WITH CONTRAST TECHNIQUE: Multidetector CT imaging of the chest was performed using the standard protocol during bolus administration of intravenous contrast. Multiplanar CT image reconstructions and MIPs were obtained to evaluate the vascular anatomy. CONTRAST:  38m ISOVUE-370 IOPAMIDOL (ISOVUE-370) INJECTION 76% COMPARISON:  Chest x-ray dated 07/21/2017 and CT scan of the chest dated 08/03/2016 FINDINGS: Cardiovascular: There is a single segmental embolus in the anterior basal region of the right lower lobe as well as small subsegmental emboli in the right  lower lobe. There is an adjacent peripheral area of lung consolidation in the right lower lobe with some calcifications. There is also adjacent moderate right pleural effusion. Chronic cardiomegaly, slightly increased. Extensive coronary artery calcification and aortic atherosclerosis. PermCath in good position. Mediastinum/Nodes: No enlarged mediastinal, hilar, or axillary lymph nodes. Thyroid gland, trachea, and esophagus demonstrate no significant findings. Lungs/Pleura: Moderate right pleural effusion. Focal area of consolidation in the right lower lobe with some parenchymal calcifications indicating that this is most likely a chronic process. This could represent acute on chronic pulmonary infarct. Slight atelectasis at the left lung base. Upper Abdomen: No acute abnormality. Musculoskeletal: No acute abnormality. Osteophytes fuse much of the mid and  lower thoracic spine. No evidence of an abscess or soft tissue inflammation at the site of the PermCath in the left supraclavicular region. Review of the MIP images confirms the above findings. IMPRESSION: 1. Small pulmonary emboli in the right lower lobe with a focal area of consolidation and some parenchymal calcification suggesting that this is not an acute process or could be acute or chronic pulmonary infarct. 2. Moderate right pleural effusion. 3. Chronic cardiomegaly, slightly increased. Coronary artery and aortic atherosclerosis. Electronically Signed   By: Lorriane Shire M.D.   On: 07/21/2017 09:07   Dg Chest Portable 1 View  Result Date: 07/21/2017 CLINICAL DATA:  Chest pain and shortness of breath.  Renal failure. EXAM: PORTABLE CHEST 1 VIEW COMPARISON:  June 22, 2017 FINDINGS: Central catheter tip is in the right atrium. No pneumothorax. There is a small right pleural effusion with bibasilar atelectatic change. The lungs elsewhere are clear. Heart is enlarged with pulmonary vascularity within normal limits. There is aortic atherosclerosis. No adenopathy. No bone lesions. IMPRESSION: Small right pleural effusion with bibasilar atelectasis. Stable cardiomegaly. There is aortic atherosclerosis. Aortic Atherosclerosis (ICD10-I70.0). Electronically Signed   By: Lowella Grip III M.D.   On: 07/21/2017 07:01    Assessment:   ZADIEL LEYH is a 62 y.o. male with MSSA bacteremia associated with R tunneled IJ HD cath which has now been removed. Site had been tender so was likely source of infection. FU BCX pending.   Recommendations Check Echo Cont ancef- Can be dosed at HD I assume he will not need new line as it seems AVF reportedly functioning. Will likely need 4 weeks IV ancef Thank you very much for allowing me to participate in the care of this patient. Please call with questions.   Cheral Marker. Ola Spurr, MD

## 2017-07-22 NOTE — Progress Notes (Signed)
[facility]  ROUNDING NOTE   Subjective:   Seen and examined on hemodialysis. Tolerating treatment well. Extra treatment today due to hyperkalemia 6.1 2K bath.   Patient with gram positive cocci in two bottles. Scheduled to have permcath to be removed today.     HEMODIALYSIS FLOWSHEET:  Blood Flow Rate (mL/min): 300 mL/min Arterial Pressure (mmHg): -240 mmHg Venous Pressure (mmHg): 220 mmHg Transmembrane Pressure (mmHg): 70 mmHg Ultrafiltration Rate (mL/min): 910 mL/min Dialysate Flow Rate (mL/min): 600 ml/min Conductivity: Machine : 14 Conductivity: Machine : 14 Dialysis Fluid Bolus: Normal Saline Bolus Amount (mL): 250 mL    Objective:  Vital signs in last 24 hours:  Temp:  [97.7 F (36.5 C)-98.4 F (36.9 C)] 98.4 F (36.9 C) (02/26 0927) Pulse Rate:  [64-78] 73 (02/26 1202) Resp:  [11-20] 12 (02/26 1202) BP: (79-126)/(64-85) 94/68 (02/26 1202) SpO2:  [88 %-100 %] 91 % (02/26 1202) Weight:  [85.5 kg (188 lb 7.9 oz)-87.7 kg (193 lb 5.5 oz)] 85.5 kg (188 lb 7.9 oz) (02/26 0927)  Weight change:  Filed Weights   07/21/17 1612 07/22/17 0500 07/22/17 0927  Weight: 87.7 kg (193 lb 5.5 oz) 85.5 kg (188 lb 7.9 oz) 85.5 kg (188 lb 7.9 oz)    Intake/Output: I/O last 3 completed shifts: In: 600 [P.O.:240; IV Piggyback:360] Out: 0    Intake/Output this shift:  Total I/O In: -  Out: 2138 [Other:2138]  Physical Exam: General: NAD  Head: Normocephalic, atraumatic. Moist oral mucosal membranes  Eyes: Anicteric, PERRL  Neck: Supple, trachea midline  Lungs:  Clear to auscultation  Heart: Regular rate and rhythm  Abdomen:  Soft, nontender,   Extremities: no peripheral edema.  Neurologic: Nonfocal, moving all four extremities  Skin: No lesions  Access: Left IJ permcath, left AVF maturing    Basic Metabolic Panel: Recent Labs  Lab 07/21/17 0659 07/22/17 0336  NA 132* 132*  K 7.0* 6.1*  CL 96* 94*  CO2 22 23  GLUCOSE 91 135*  BUN 86* 70*   CREATININE 12.64* 10.32*  CALCIUM 9.6 8.7*    Liver Function Tests: Recent Labs  Lab 07/21/17 0659  AST 31  ALT 30  ALKPHOS 140*  BILITOT 1.0  PROT 9.7*  ALBUMIN 3.6   No results for input(s): LIPASE, AMYLASE in the last 168 hours. No results for input(s): AMMONIA in the last 168 hours.  CBC: Recent Labs  Lab 07/21/17 0613 07/22/17 0336  WBC 9.7 11.3*  NEUTROABS 6.0  --   HGB 13.5 12.7*  HCT 41.9 39.3*  MCV 87.8 87.8  PLT 175 139*    Cardiac Enzymes: Recent Labs  Lab 07/21/17 0659  TROPONINI 0.19*    BNP: Invalid input(s): POCBNP  CBG: No results for input(s): GLUCAP in the last 168 hours.  Microbiology: Results for orders placed or performed during the hospital encounter of 07/21/17  Blood culture (routine x 2)     Status: None (Preliminary result)   Collection Time: 07/21/17  9:13 AM  Result Value Ref Range Status   Specimen Description   Final    BLOOD RIGHT ARM Performed at [facility], [address], [place], [place] [address]    Special Requests   Final    BOTTLES DRAWN AEROBIC AND ANAEROBIC Blood Culture adequate volume Performed at [facility], [address] [place], [place] [address]    Culture  Setup Time   Final    Organism ID to follow Blanca  BOTTLES CRITICAL RESULT CALLED TO, READ BACK BY AND VERIFIED WITH: DAVID BESANTI ON 07/22/17 AT 0012 JAG GRAM STAIN REVIEWED-AGREE WITH RESULT Performed at [facility], [place] [address] [place], [place] [address]    Culture GRAM POSITIVE COCCI  Final   Report Status PENDING  Incomplete  Blood culture (routine x 2)     Status: None (Preliminary result)   Collection Time: 07/21/17  9:13 AM  Result Value Ref Range Status   Specimen Description   Final    BLOOD RIGHT [facility]  Performed at [facility], [address] [place], [place] [address]    Special Requests   Final    BOTTLES DRAWN AEROBIC AND ANAEROBIC Blood  Culture adequate volume Performed at [facility], [address], [place], [place] [address]    Culture  Setup Time   Final    GRAM POSITIVE COCCI IN BOTH AEROBIC AND ANAEROBIC BOTTLES CRITICAL VALUE NOTED.  VALUE IS CONSISTENT WITH PREVIOUSLY REPORTED AND CALLED VALUE. JAG GRAM STAIN REVIEWED-AGREE WITH RESULT Performed at [facility], [place] [address], [place], [place] [address]    Culture GRAM POSITIVE COCCI  Final   Report Status PENDING  Incomplete  Blood Culture ID Panel (Reflexed)     Status: Abnormal   Collection Time: 07/21/17  9:13 AM  Result Value Ref Range Status   Enterococcus species NOT DETECTED NOT DETECTED Final   Listeria monocytogenes NOT DETECTED NOT DETECTED Final   Staphylococcus species DETECTED (A) NOT DETECTED Final    Comment: CRITICAL RESULT CALLED TO, READ BACK BY AND VERIFIED WITH: DAVID BESANTI ON 07/22/17 AT 0012 BY JAG    Staphylococcus aureus DETECTED (A) NOT DETECTED Final    Comment: Methicillin (oxacillin) susceptible Staphylococcus aureus (MSSA). Preferred therapy is anti staphylococcal beta lactam antibiotic (Cefazolin or Nafcillin), unless clinically contraindicated. CRITICAL RESULT CALLED TO, READ BACK BY AND VERIFIED WITH: DAVID BESANTI ON 07/22/17 AT 0012 BY JAG    Methicillin resistance NOT DETECTED NOT DETECTED Final   Streptococcus species NOT DETECTED NOT DETECTED Final   Streptococcus agalactiae NOT DETECTED NOT DETECTED Final   Streptococcus pneumoniae NOT DETECTED NOT DETECTED Final   Streptococcus pyogenes NOT DETECTED NOT DETECTED Final   Acinetobacter baumannii NOT DETECTED NOT DETECTED Final   Enterobacteriaceae species NOT DETECTED NOT DETECTED Final   Enterobacter cloacae complex NOT DETECTED NOT DETECTED Final   Escherichia coli NOT DETECTED NOT DETECTED Final   Klebsiella oxytoca NOT DETECTED NOT DETECTED Final   Klebsiella pneumoniae NOT DETECTED NOT DETECTED Final   Proteus species NOT DETECTED NOT  DETECTED Final   Serratia marcescens NOT DETECTED NOT DETECTED Final   Haemophilus influenzae NOT DETECTED NOT DETECTED Final   Neisseria meningitidis NOT DETECTED NOT DETECTED Final   Pseudomonas aeruginosa NOT DETECTED NOT DETECTED Final   Candida albicans NOT DETECTED NOT DETECTED Final   Candida glabrata NOT DETECTED NOT DETECTED Final   Candida krusei NOT DETECTED NOT DETECTED Final   Candida parapsilosis NOT DETECTED NOT DETECTED Final   Candida tropicalis NOT DETECTED NOT DETECTED Final    Comment: Performed at [facility], [address] [place],  [address]    Coagulation Studies: Recent Labs    07/21/17 1020  LABPROT 18.2*  INR 1.52    Urinalysis: No results for input(s): COLORURINE, LABSPEC, PHURINE, GLUCOSEU, HGBUR, BILIRUBINUR, KETONESUR, PROTEINUR, UROBILINOGEN, NITRITE, LEUKOCYTESUR in the last 72 hours.  Invalid input(s): APPERANCEUR    Imaging: Ct Angio Chest Pe W Or  Wo Contrast  Result Date: 07/21/2017 CLINICAL DATA:  Shortness of breath.  Tenderness around PermCath. EXAM: CT ANGIOGRAPHY CHEST WITH CONTRAST TECHNIQUE: Multidetector CT imaging of the chest was performed using the standard protocol during bolus administration of intravenous contrast. Multiplanar CT image reconstructions and MIPs were obtained to evaluate the vascular anatomy. CONTRAST:  82mL ISOVUE-370 IOPAMIDOL (ISOVUE-370) INJECTION 76% COMPARISON:  Chest x-ray dated 07/21/2017 and CT scan of the chest dated 08/03/2016 FINDINGS: Cardiovascular: There is a single segmental embolus in the anterior basal region of the right lower lobe as well as small subsegmental emboli in the right lower lobe. There is an adjacent peripheral area of lung consolidation in the right lower lobe with some calcifications. There is also adjacent moderate right pleural effusion. Chronic cardiomegaly, slightly increased. Extensive coronary artery calcification and aortic atherosclerosis. PermCath in good  position. Mediastinum/Nodes: No enlarged mediastinal, hilar, or axillary lymph nodes. Thyroid gland, trachea, and esophagus demonstrate no significant findings. Lungs/Pleura: Moderate right pleural effusion. Focal area of consolidation in the right lower lobe with some parenchymal calcifications indicating that this is most likely a chronic process. This could represent acute on chronic pulmonary infarct. Slight atelectasis at the left lung base. Upper Abdomen: No acute abnormality. Musculoskeletal: No acute abnormality. Osteophytes fuse much of the mid and lower thoracic spine. No evidence of an abscess or soft tissue inflammation at the site of the PermCath in the left supraclavicular region. Review of the MIP images confirms the above findings. IMPRESSION: 1. Small pulmonary emboli in the right lower lobe with a focal area of consolidation and some parenchymal calcification suggesting that this is not an acute process or could be acute or chronic pulmonary infarct. 2. Moderate right pleural effusion. 3. Chronic cardiomegaly, slightly increased. Coronary artery and aortic atherosclerosis. Electronically Signed   By: Lorriane Shire M.D.   On: 07/21/2017 09:07   Dg Chest Portable 1 View  Result Date: 07/21/2017 CLINICAL DATA:  Chest pain and shortness of breath.  Renal failure. EXAM: PORTABLE CHEST 1 VIEW COMPARISON:  June 22, 2017 FINDINGS: Central catheter tip is in the right atrium. No pneumothorax. There is a small right pleural effusion with bibasilar atelectatic change. The lungs elsewhere are clear. Heart is enlarged with pulmonary vascularity within normal limits. There is aortic atherosclerosis. No adenopathy. No bone lesions. IMPRESSION: Small right pleural effusion with bibasilar atelectasis. Stable cardiomegaly. There is aortic atherosclerosis. Aortic Atherosclerosis (ICD10-I70.0). Electronically Signed   By: Lowella Grip III M.D.   On: 07/21/2017 07:01     Medications:   . sodium  chloride    .  ceFAZolin (ANCEF) IV     . apixaban  5 mg Oral BID  . aspirin EC  325 mg Oral Daily  . atorvastatin  10 mg Oral QHS  . calcium acetate  667 mg Oral Q breakfast  . carvedilol  3.125 mg Oral BID WC  . isosorbide mononitrate  30 mg Oral Daily  . loratadine  10 mg Oral Daily  . mouth rinse  15 mL Mouth Rinse BID  . methylPREDNISolone (SOLU-MEDROL) injection  40 mg Intravenous Q24H  . midodrine  5 mg Oral BID  . mometasone-formoterol  2 puff Inhalation BID  . multivitamin with minerals  1 tablet Oral BID  . pantoprazole  40 mg Oral Daily  . pregabalin  75 mg Oral Daily  . sevelamer carbonate  800 mg Oral TID WC  . sodium chloride flush  3 mL Intravenous Q12H  . tiotropium  18 mcg  Inhalation Daily     Assessment/ Plan:  [name] is a 62 y.o. black male with ESRD on hemodialysis, hypertension, hyperlipidemia, anemia, COPD, peripheral vascular disease, left BKA, right toe amputations  MWF [facility].   1. End stage renal disease with hyperkalemia. Extra treatment today.  - Seen and examined on hemodialysis. 2K bath.  - Dialysis device complication: suspect blood stream infection catheter related. Appreciate vascular input. Patient to have tunneled catheter to be removed and have a catheter free period.   2. Anemia of chronic kidney disease: hemoglobin 12.7 - EPO as outpatient.   3. Hypertension: hypotensive - midodrine before dialysis.   4. Secondary Hyperparathyroidism: - calcium acetate and sevelamer with meals.     LOS: 1 Doreene Forrey 2/26/20192:31 PM

## 2017-07-22 NOTE — Progress Notes (Signed)
Post HD Tx  

## 2017-07-22 NOTE — Progress Notes (Signed)
PHARMACY - PHYSICIAN COMMUNICATION CRITICAL VALUE ALERT - BLOOD CULTURE IDENTIFICATION (BCID)  [name] is an 62 y.o. 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 WBC 9.7 need repeat  Name of physician (or Provider) Contacted: Lance Coon  Current antibiotics: doxycycline   Changes to prescribed antibiotics recommended:  Recommendations accepted by provider -- will switch to cefazolin 2g IV q48h per CrCl < 10 ml/min and will adjust regimen per culture/sensitivites.  Results for orders placed or performed during the hospital encounter of 07/21/17  Blood Culture ID Panel (Reflexed) (Collected: 07/21/2017  9:13 AM)  Result Value Ref Range   Enterococcus species NOT DETECTED NOT DETECTED   Listeria monocytogenes NOT DETECTED NOT DETECTED   Staphylococcus species DETECTED (A) NOT DETECTED   Staphylococcus aureus DETECTED (A) NOT DETECTED   Methicillin resistance NOT DETECTED NOT DETECTED   Streptococcus species NOT DETECTED NOT DETECTED   Streptococcus agalactiae NOT DETECTED NOT DETECTED   Streptococcus pneumoniae NOT DETECTED NOT DETECTED   Streptococcus pyogenes NOT DETECTED NOT DETECTED   Acinetobacter baumannii NOT DETECTED NOT DETECTED   Enterobacteriaceae species NOT DETECTED NOT DETECTED   Enterobacter cloacae complex NOT DETECTED NOT DETECTED   Escherichia coli NOT DETECTED NOT DETECTED   Klebsiella oxytoca NOT DETECTED NOT DETECTED   Klebsiella pneumoniae NOT DETECTED NOT DETECTED   Proteus species NOT DETECTED NOT DETECTED   Serratia marcescens NOT DETECTED NOT DETECTED   Haemophilus influenzae NOT DETECTED NOT DETECTED   Neisseria meningitidis NOT DETECTED NOT DETECTED   Pseudomonas aeruginosa NOT DETECTED NOT DETECTED   Candida albicans NOT DETECTED NOT DETECTED   Candida glabrata NOT DETECTED NOT DETECTED   Candida krusei NOT DETECTED NOT  DETECTED   Candida parapsilosis NOT DETECTED NOT DETECTED   Candida tropicalis NOT DETECTED NOT DETECTED   Tobie Lords, PharmD, BCPS Clinical Pharmacist 07/22/2017

## 2017-07-22 NOTE — Consult Note (Signed)
[facility] Consult Note  MRN : [id number]  [name] is a 62 y.o. (1955/07/02) male who presents with chief complaint of  Chief Complaint  Patient presents with  . Shortness of Breath  . Pain at dialysis access  .  History of Present Illness: I am asked to evaluate the patient by Dr. Juleen China.  The patient is a 62 year old gentleman who presented to [facility] regional emergency room yesterday with increasing shortness of breath.  On arrival to the emergency room he was tachypneic and immediately placed on BiPAP.  ABG showed he was acidotic with a markedly elevated PCO2.  Initially he was thought to be in congestive heart failure or possibly to have had recurrent PE however he has been noted to have an increased white count and positive blood cultures and he is now suspected of catheter related sepsis.  I am asked to evaluate the catheter.  Patient states the catheter is been tender.  He also notes that he has been having chills while on dialysis.  Current Facility-Administered Medications  Medication Dose Route Frequency Provider Last Rate Last Dose  . 0.9 %  sodium chloride infusion  250 mL Intravenous PRN Bettey Costa, MD      . acetaminophen (TYLENOL) tablet 500 mg  500 mg Oral Q6H PRN Mody, Sital, MD      . albuterol (PROVENTIL) (2.5 MG/3ML) 0.083% nebulizer solution 2.5 mg  2.5 mg Nebulization Q6H PRN Mody, Sital, MD      . apixaban (ELIQUIS) tablet 5 mg  5 mg Oral BID Bettey Costa, MD   5 mg at 07/22/17 0944  . aspirin EC tablet 325 mg  325 mg Oral Daily Bettey Costa, MD   325 mg at 07/22/17 0944  . atorvastatin (LIPITOR) tablet 10 mg  10 mg Oral QHS Bettey Costa, MD   10 mg at 07/21/17 2041  . bisacodyl (DULCOLAX) EC tablet 5 mg  5 mg Oral Daily PRN Bettey Costa, MD      . calcium acetate (PHOSLO) capsule 667 mg  667 mg Oral Q breakfast Bettey Costa, MD   667 mg at 07/22/17 0737  . carvedilol (COREG) tablet 3.125 mg  3.125 mg Oral BID WC Bettey Costa, MD    Stopped at 07/22/17 [phone]  . ceFAZolin (ANCEF) IVPB 1 g/50 mL premix  1 g Intravenous Q24H Hallaji, Sheema M, RPH      . docusate sodium (COLACE) capsule 100 mg  100 mg Oral Daily PRN Bettey Costa, MD      . guaifenesin (ROBITUSSIN) 100 MG/5ML syrup 200 mg  200 mg Oral TID PRN Bettey Costa, MD      . hydrALAZINE (APRESOLINE) injection 10 mg  10 mg Intravenous Q6H PRN Mody, Sital, MD      . ipratropium-albuterol (DUONEB) 0.5-2.5 (3) MG/3ML nebulizer solution 3 mL  3 mL Nebulization Q6H PRN Mody, Sital, MD      . isosorbide mononitrate (IMDUR) 24 hr tablet 30 mg  30 mg Oral Daily Bettey Costa, MD   Stopped at 07/22/17 0945  . ketorolac (TORADOL) 30 MG/ML injection 30 mg  30 mg Intravenous Q6H PRN Mody, Sital, MD      . loratadine (CLARITIN) tablet 10 mg  10 mg Oral Daily Bettey Costa, MD   10 mg at 07/22/17 0945  . MEDLINE mouth rinse  15 mL Mouth Rinse BID Bettey Costa, MD   15 mL at 07/22/17 0946  . methylPREDNISolone sodium succinate (SOLU-MEDROL) 40 mg/mL injection 40  mg  40 mg Intravenous Q24H Bettey Costa, MD   40 mg at 07/22/17 0946  . midodrine (PROAMATINE) tablet 5 mg  5 mg Oral BID Bettey Costa, MD   5 mg at 07/22/17 0947  . mometasone-formoterol (DULERA) 200-5 MCG/ACT inhaler 2 puff  2 puff Inhalation BID Bettey Costa, MD   2 puff at 07/22/17 0742  . multivitamin with minerals tablet 1 tablet  1 tablet Oral BID Bettey Costa, MD   1 tablet at 07/22/17 0947  . nitroGLYCERIN (NITROSTAT) SL tablet 0.4 mg  0.4 mg Sublingual Q5 min PRN Bettey Costa, MD      . ondansetron (ZOFRAN) tablet 4 mg  4 mg Oral Q6H PRN Mody, Sital, MD       Or  . ondansetron (ZOFRAN) injection 4 mg  4 mg Intravenous Q6H PRN Mody, Sital, MD      . pantoprazole (PROTONIX) EC tablet 40 mg  40 mg Oral Daily Bettey Costa, MD   40 mg at 07/22/17 0947  . polyethylene glycol (MIRALAX / GLYCOLAX) packet 17 g  17 g Oral Daily PRN Mody, Sital, MD      . polyethylene glycol (MIRALAX / GLYCOLAX) packet 17 g  17 g Oral Daily PRN Mody, Sital,  MD      . pregabalin (LYRICA) capsule 75 mg  75 mg Oral Daily Bettey Costa, MD   75 mg at 07/22/17 0948  . sevelamer carbonate (RENVELA) tablet 800 mg  800 mg Oral TID WC Mody, Sital, MD   800 mg at 07/22/17 1331  . sodium chloride flush (NS) 0.9 % injection 3 mL  3 mL Intravenous Q12H Mody, Sital, MD   3 mL at 07/22/17 1332  . sodium chloride flush (NS) 0.9 % injection 3 mL  3 mL Intravenous PRN Bettey Costa, MD      . tiotropium (SPIRIVA) inhalation capsule 18 mcg  18 mcg Inhalation Daily Bettey Costa, MD   18 mcg at 07/22/17 0739  . traMADol (ULTRAM) tablet 50 mg  50 mg Oral Q6H PRN Bettey Costa, MD        Past Medical History:  Diagnosis Date  . 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 Amputee, below knee, left ([place])   . Anemia   . Asthma   . Cardiomyopathy ([place])   . CHF (congestive heart failure) ([place])   . Chronic systolic heart failure ([place])   . Complication of anesthesia    hypotension  . COPD (chronic obstructive pulmonary disease) ([place])   . Coronary artery disease   . Dialysis patient ([place])    Mon, Wed, Fri  . End stage renal disease ([place])   . GERD (gastroesophageal reflux disease)   . Headache   . History of kidney stones   . History of pulmonary embolism   . HLD (hyperlipidemia)   . HTN (hypertension)   . Hyperparathyroidism   . Myocardial infarction ([place])   . Peripheral vascular disease ([place])   . Shortness of breath dyspnea   . Sleep apnea    NO C-PAP, Patient stated in process of  "getting one"   . Tobacco dependence     Past Surgical History:  Procedure Laterality Date  . AMPUTATION Left 05/06/2014   Procedure: AMPUTATION BELOW KNEE;  Surgeon: Elam Dutch, MD;  Location: [facility];  Service: Vascular;  Laterality: Left;  . AMPUTATION Right 01/12/2015   Procedure: Foot transmetatarsal amputation;  Surgeon: Algernon Huxley, MD;  Location: [facility];  Service:  Vascular;  Laterality: Right;  . APPLICATION OF WOUND VAC Right 03/01/2015   Procedure: Application  of Bio-connekt graft and wound vac application to right foot ;  Surgeon: Algernon Huxley, MD;  Location: [facility];  Service: Vascular;  Laterality: Right;  . AV FISTULA PLACEMENT Left   . AV FISTULA PLACEMENT Left 11/28/2016   Procedure: ARTERIOVENOUS (AV) FISTULA CREATION;  Surgeon: Algernon Huxley, MD;  Location: [facility];  Service: Vascular;  Laterality: Left;  . CARDIAC CATHETERIZATION     stent placement   . CORONARY ANGIOPLASTY    . DIALYSIS/PERMA CATHETER INSERTION N/A 07/31/2016   Procedure: Dialysis/Perma Catheter Insertion;  Surgeon: Algernon Huxley, MD;  Location: [facility];  Service: Cardiovascular;  Laterality: N/A;  . IR FLUORO GUIDE CV LINE LEFT  04/10/2017  . LIGATION OF ARTERIOVENOUS  FISTULA Right 01/31/2016   Procedure: LIGATION OF ARTERIOVENOUS  FISTULA;  Surgeon: Algernon Huxley, MD;  Location: [facility];  Service: Vascular;  Laterality: Right;  . PERIPHERAL VASCULAR CATHETERIZATION Right 12/15/2014   Procedure: Lower Extremity Angiography;  Surgeon: Algernon Huxley, MD;  Location: [facility];  Service: Cardiovascular;  Laterality: Right;  . PERIPHERAL VASCULAR CATHETERIZATION  12/15/2014   Procedure: Lower Extremity Intervention;  Surgeon: Algernon Huxley, MD;  Location: [facility];  Service: Cardiovascular;;  . PERIPHERAL VASCULAR CATHETERIZATION Right 08/14/2015   Procedure: A/V Shuntogram/Fistulagram;  Surgeon: Algernon Huxley, MD;  Location: [facility];  Service: Cardiovascular;  Laterality: Right;  . PERIPHERAL VASCULAR CATHETERIZATION N/A 08/14/2015   Procedure: A/V Shunt Intervention;  Surgeon: Algernon Huxley, MD;  Location: [facility];  Service: Cardiovascular;  Laterality: N/A;  . PERIPHERAL VASCULAR CATHETERIZATION N/A 01/11/2016   Procedure: Dialysis/Perma Catheter Insertion;  Surgeon: Algernon Huxley, MD;  Location: [facility];  Service: Cardiovascular;  Laterality: N/A;  . REVISON OF ARTERIOVENOUS FISTULA Right 02/17/2016   Procedure: removal of  AV fistula;  Surgeon: Serafina Mitchell, MD;  Location: [facility];  Service: Vascular;  Laterality: Right;  . REVISON OF ARTERIOVENOUS FISTULA Right 01/31/2016   Procedure: REVISON OF ARTERIOVENOUS FISTULA ( BRACHIOCEPHALIC ) W/ ARTEGRAFT;  Surgeon: Algernon Huxley, MD;  Location: [facility];  Service: Vascular;  Laterality: Right;  . TRANSMETATARSAL AMPUTATION Right 05/04/2015   Procedure: TRANSMETATARSAL AMPUTATION REVISION, great toe amputation;  Surgeon: Algernon Huxley, MD;  Location: [facility];  Service: Vascular;  Laterality: Right;    Social History Social History   Tobacco Use  . Smoking status: Light Tobacco Smoker    Packs/day: 0.25    Years: 30.00    Pack years: 7.50    Types: Cigarettes  . Smokeless tobacco: Never Used  . Tobacco comment: 5  Substance Use Topics  . Alcohol use: No    Alcohol/week: 0.0 oz  . Drug use: No    Comment: has used crack cocaine in past     Family History Family History  Problem Relation Age of Onset  . Leukemia Mother   . Heart attack Father   . Heart failure Other   . Hypertension Other   . Leukemia Other   . Diabetes Other   . Prostate cancer Neg Hx   . Kidney cancer Neg Hx   . Bladder Cancer Neg Hx   No family history of bleeding/clotting disorders, porphyria or autoimmune disease   Allergies  Allergen Reactions  . Dust Mite Extract Other (See Comments)    Reaction: unknown  REVIEW OF SYSTEMS (Negative unless checked)  Constitutional: [] Weight loss  [] Fever  [] Chills Cardiac: [] Chest pain   [] Chest pressure   [] Palpitations   [] Shortness of breath when laying flat   [x] Shortness of breath at rest   [x] Shortness of breath with exertion. Vascular:  [] Pain in legs with walking   [] Pain in legs at rest   [] Pain in legs when laying flat   [] Claudication   [] Pain in feet when walking  [] Pain in feet at rest  [] Pain in feet when laying flat   [x] History of DVT   [] Phlebitis   [] Swelling in legs   [] Varicose veins   [] Non-healing  ulcers Pulmonary:   [] Uses home oxygen   [] Productive cough   [] Hemoptysis   [] Wheeze  [] COPD   [] Asthma Neurologic:  [] Dizziness  [] Blackouts   [] Seizures   [] History of stroke   [] History of TIA  [] Aphasia   [] Temporary blindness   [] Dysphagia   [] Weakness or numbness in arms   [] Weakness or numbness in legs Musculoskeletal:  [] Arthritis   [] Joint swelling   [] Joint pain   [] Low back pain Hematologic:  [] Easy bruising  [] Easy bleeding   [] Hypercoagulable state   [] Anemic  [] Hepatitis Gastrointestinal:  [] Blood in stool   [] Vomiting blood  [] Gastroesophageal reflux/heartburn   [] Difficulty swallowing. Genitourinary:  [x] Chronic kidney disease   [] Difficult urination  [] Frequent urination  [] Burning with urination   [] Blood in urine Skin:  [] Rashes   [] Ulcers   [] Wounds Psychological:  [] History of anxiety   []  History of major depression.  Physical Examination  Vitals:   07/22/17 1200 07/22/17 1202 07/22/17 1458 07/22/17 1611  BP: (!) 87/64 94/68 108/78 116/73  Pulse: 72 73 75 67  Resp: 11 12 15 16   Temp:   97.9 F (36.6 C) 98.6 F (37 C)  TempSrc:   Oral Oral  SpO2: 92% 91% 97% 96%  Weight:      Height:       Body mass index is 24.2 kg/m. Gen:  WD/WN, NAD Head: [place]/AT, No temporalis wasting. Prominent temp pulse not noted. Ear/Nose/Throat: Hearing grossly intact, nares w/o erythema or drainage, oropharynx w/o Erythema/Exudate Eyes: Sclera non-icteric, conjunctiva clear Neck: Trachea midline.  No JVD.  Pulmonary:  Good air movement, respirations not labored, equal bilaterally.  Cardiac: RRR, normal S1, S2. Vascular: Left IJ catheter is tender to palpation there is no purulent drainage however the cuff is almost exposed. Vessel Right Left  Radial Palpable Palpable  Ulnar Palpable Palpable  Brachial Palpable Palpable  Gastrointestinal: soft, non-tender/non-distended. No guarding/reflex.  Musculoskeletal: M/S 5/5 throughout.  Extremities without ischemic changes.  No deformity or  atrophy. No edema. Neurologic: Sensation grossly intact in extremities.  Symmetrical.  Speech is fluent. Motor exam as listed above. Psychiatric: Judgment intact, Mood & affect appropriate for pt's clinical situation. Dermatologic: No rashes or ulcers noted.  No cellulitis or open wounds. Lymph : No Cervical, Axillary, or Inguinal lymphadenopathy.      CBC Lab Results  Component Value Date   WBC 11.3 (H) 07/22/2017   HGB 12.7 (L) 07/22/2017   HCT 39.3 (L) 07/22/2017   MCV 87.8 07/22/2017   PLT 139 (L) 07/22/2017    BMET    Component Value Date/Time   NA 132 (L) 07/22/2017 0336   NA 135 (L) 04/12/2014 1341   K 6.1 (H) 07/22/2017 0336   K 5.2 01/12/2015 1210   K 3.6 04/12/2014 1341   CL 94 (L) 07/22/2017 0336   CL 97 (L) 04/12/2014  1341   CO2 23 07/22/2017 0336   CO2 28 04/12/2014 1341   GLUCOSE 135 (H) 07/22/2017 0336   GLUCOSE 85 04/12/2014 1341   BUN 70 (H) 07/22/2017 0336   BUN 40 (H) 04/12/2014 1341   CREATININE 10.32 (H) 07/22/2017 0336   CREATININE 9.58 (H) 04/12/2014 1341   CALCIUM 8.7 (L) 07/22/2017 0336   CALCIUM 9.5 04/12/2014 1341   GFRNONAA 5 (L) 07/22/2017 0336   GFRNONAA 6 (L) 03/15/2014 2323   GFRNONAA 6 (L) 02/10/2014 1119   GFRAA 5 (L) 07/22/2017 0336   GFRAA 7 (L) 03/15/2014 2323   GFRAA 7 (L) 02/10/2014 1119   Estimated Creatinine Clearance: 8.5 mL/min (A) (by C-G formula based on SCr of 10.32 mg/dL (H)).  COAG Lab Results  Component Value Date   INR 1.52 07/21/2017   INR 1.25 03/05/2017   INR 1.23 01/20/2017    Radiology Dg Chest 2 View  Result Date: 06/22/2017 CLINICAL DATA:  Patient with shortness of breath. Nonproductive cough. EXAM: CHEST  2 VIEW COMPARISON:  Chest radiograph 04/07/2017 FINDINGS: Central venous catheter tip projects over the right atrium. Monitoring leads overlie the patient. Stable marked cardiomegaly. Aortic atherosclerosis. Small right pleural effusion. Pulmonary vascular redistribution. Mid lower lung  heterogeneous pulmonary opacities. Thoracic spine degenerative changes. IMPRESSION: Cardiomegaly with mild interstitial opacities favored to represent edema. Small right pleural effusion. Electronically Signed   By: Lovey Newcomer M.D.   On: 06/22/2017 17:54   Ct Angio Chest Pe W Or Wo Contrast  Result Date: 07/21/2017 CLINICAL DATA:  Shortness of breath.  Tenderness around PermCath. EXAM: CT ANGIOGRAPHY CHEST WITH CONTRAST TECHNIQUE: Multidetector CT imaging of the chest was performed using the standard protocol during bolus administration of intravenous contrast. Multiplanar CT image reconstructions and MIPs were obtained to evaluate the vascular anatomy. CONTRAST:  56mL ISOVUE-370 IOPAMIDOL (ISOVUE-370) INJECTION 76% COMPARISON:  Chest x-ray dated 07/21/2017 and CT scan of the chest dated 08/03/2016 FINDINGS: Cardiovascular: There is a single segmental embolus in the anterior basal region of the right lower lobe as well as small subsegmental emboli in the right lower lobe. There is an adjacent peripheral area of lung consolidation in the right lower lobe with some calcifications. There is also adjacent moderate right pleural effusion. Chronic cardiomegaly, slightly increased. Extensive coronary artery calcification and aortic atherosclerosis. PermCath in good position. Mediastinum/Nodes: No enlarged mediastinal, hilar, or axillary lymph nodes. Thyroid gland, trachea, and esophagus demonstrate no significant findings. Lungs/Pleura: Moderate right pleural effusion. Focal area of consolidation in the right lower lobe with some parenchymal calcifications indicating that this is most likely a chronic process. This could represent acute on chronic pulmonary infarct. Slight atelectasis at the left lung base. Upper Abdomen: No acute abnormality. Musculoskeletal: No acute abnormality. Osteophytes fuse much of the mid and lower thoracic spine. No evidence of an abscess or soft tissue inflammation at the site of the  PermCath in the left supraclavicular region. Review of the MIP images confirms the above findings. IMPRESSION: 1. Small pulmonary emboli in the right lower lobe with a focal area of consolidation and some parenchymal calcification suggesting that this is not an acute process or could be acute or chronic pulmonary infarct. 2. Moderate right pleural effusion. 3. Chronic cardiomegaly, slightly increased. Coronary artery and aortic atherosclerosis. Electronically Signed   By: Lorriane Shire M.D.   On: 07/21/2017 09:07   Dg Chest Portable 1 View  Result Date: 07/21/2017 CLINICAL DATA:  Chest pain and shortness of breath.  Renal failure. EXAM: PORTABLE  CHEST 1 VIEW COMPARISON:  June 22, 2017 FINDINGS: Central catheter tip is in the right atrium. No pneumothorax. There is a small right pleural effusion with bibasilar atelectatic change. The lungs elsewhere are clear. Heart is enlarged with pulmonary vascularity within normal limits. There is aortic atherosclerosis. No adenopathy. No bone lesions. IMPRESSION: Small right pleural effusion with bibasilar atelectasis. Stable cardiomegaly. There is aortic atherosclerosis. Aortic Atherosclerosis (ICD10-I70.0). Electronically Signed   By: Lowella Grip III M.D.   On: 07/21/2017 07:01      Assessment/Plan 1.  Complication dialysis device with infection of tunneled catheter:  Patient's left IJ dialysis catheter is infected. The patient will undergo removal of his tunneled catheter at the bedside.  2.  End-stage renal disease requiring hemodialysis:  Patient will continue dialysis therapy after 24-48 hours free of catheters.  Dialysis has already been arranged since the patient missed their previous session 3.  Hypertension:  Patient will continue medical management; nephrology is following no changes in oral medications. 4.  Gastroesophageal reflux disease:  Continue antihypertensive medications as already ordered, these medications have been reviewed and there  are no changes at this time.  Avoidence of caffeine and alcohol.  Moderate elevation of the head of the bed  5.  Coronary artery disease:  EKG will be monitored. Nitrates will be used if needed. The patient's oral cardiac medications will be continued.     Hortencia Pilar, MD  07/22/2017 4:55 PM    This note was created with Dragon medical transcription system.  Any error is purely unintentional

## 2017-07-23 ENCOUNTER — Inpatient Hospital Stay
Admit: 2017-07-23 | Discharge: 2017-07-23 | Disposition: A | Discharge status: Home / Self Care | Payer: Medicare Other | Attending: Infectious Diseases | Admitting: Infectious Diseases

## 2017-07-23 ENCOUNTER — Encounter: Payer: Self-pay | Admitting: Vascular Surgery

## 2017-07-23 LAB — RENAL FUNCTION PANEL
ANION GAP: 16 — AB (ref 5–15)
Albumin: 2.9 g/dL — ABNORMAL LOW (ref 3.5–5.0)
BUN: 86 mg/dL — AB (ref 6–20)
CO2: 22 mmol/L (ref 22–32)
Calcium: 8.8 mg/dL — ABNORMAL LOW (ref 8.9–10.3)
Chloride: 95 mmol/L — ABNORMAL LOW (ref 101–111)
Creatinine, Ser: 10.68 mg/dL — ABNORMAL HIGH (ref 0.61–1.24)
GFR calc Af Amer: 5 mL/min — ABNORMAL LOW (ref >60)
GFR, EST NON AFRICAN AMERICAN: 4 mL/min — AB (ref >60)
Glucose, Bld: 145 mg/dL — ABNORMAL HIGH (ref 65–99)
POTASSIUM: 6.3 mmol/L — AB (ref 3.5–5.1)
Phosphorus: 5.9 mg/dL — ABNORMAL HIGH (ref 2.5–4.6)
Sodium: 133 mmol/L — ABNORMAL LOW (ref 135–145)

## 2017-07-23 LAB — BASIC METABOLIC PANEL
ANION GAP: 14 (ref 5–15)
BUN: 84 mg/dL — AB (ref 6–20)
CHLORIDE: 96 mmol/L — AB (ref 101–111)
CO2: 22 mmol/L (ref 22–32)
Calcium: 8.6 mg/dL — ABNORMAL LOW (ref 8.9–10.3)
Creatinine, Ser: 10.59 mg/dL — ABNORMAL HIGH (ref 0.61–1.24)
GFR calc Af Amer: 5 mL/min — ABNORMAL LOW (ref >60)
GFR, EST NON AFRICAN AMERICAN: 5 mL/min — AB (ref >60)
Glucose, Bld: 119 mg/dL — ABNORMAL HIGH (ref 65–99)
POTASSIUM: 6.1 mmol/L — AB (ref 3.5–5.1)
SODIUM: 132 mmol/L — AB (ref 135–145)

## 2017-07-23 MED ORDER — PREDNISONE 50 MG PO TABS
50.0000 mg | ORAL_TABLET | Freq: Every day | ORAL | Status: DC
  Administered 2017-07-24: 50 mg via ORAL
  Filled 2017-07-23: qty 1

## 2017-07-23 MED ORDER — DEXTROSE 50 % IV SOLN: 25.0000 mL | Freq: Once | INTRAVENOUS | Status: DC

## 2017-07-23 MED ORDER — INSULIN ASPART 100 UNIT/ML IV SOLN
10.0000 [IU] | Freq: Once | INTRAVENOUS | Status: DC
  Filled 2017-07-23: qty 0.1

## 2017-07-23 MED ORDER — SODIUM POLYSTYRENE SULFONATE PO POWD
30.0000 g | Freq: Once | ORAL | Status: AC
  Administered 2017-07-23: 30 g via ORAL
  Filled 2017-07-23 (×2): qty 30

## 2017-07-23 NOTE — Progress Notes (Signed)
[facility]  Daily Progress Note   Subjective  - 1 Day Post-Op  Feeling some better.  Got dialysis Monday and Tuesday.  No fever overnight.  Blood cultures ordered today  Objective Vitals:   07/22/17 1611 07/22/17 1947 07/23/17 0417 07/23/17 1237  BP: 116/73 114/74 123/84 97/60  Pulse: 67 66 (!) 58 63  Resp: 16 16 16    Temp: 98.6 F (37 C) (!) 97.5 F (36.4 C) 97.9 F (36.6 C) 97.6 F (36.4 C)  TempSrc: Oral Oral Oral Oral  SpO2: 96% 98% 98% 97%  Weight:   86.2 kg (190 lb)   Height:        Intake/Output Summary (Last 24 hours) at 07/23/2017 1315 Last data filed at 07/23/2017 0950 Gross per 24 hour  Intake 480 ml  Output 0 ml  Net 480 ml    PULM  CTAB CV  RRR VASC  left arm AV fistula is small disappears in the distal to mid upper arm.  Fistula would not be currently usable for dialysis  Laboratory CBC    Component Value Date/Time   WBC 11.3 (H) 07/22/2017 0336   HGB 12.7 (L) 07/22/2017 0336   HGB 12.8 (L) 04/12/2014 1341   HCT 39.3 (L) 07/22/2017 0336   HCT 40.9 04/12/2014 1341   PLT 139 (L) 07/22/2017 0336   PLT 215 04/12/2014 1341    BMET    Component Value Date/Time   NA 132 (L) 07/23/2017 0820   NA 135 (L) 04/12/2014 1341   K 6.1 (H) 07/23/2017 0820   K 5.2 01/12/2015 1210   K 3.6 04/12/2014 1341   CL 96 (L) 07/23/2017 0820   CL 97 (L) 04/12/2014 1341   CO2 22 07/23/2017 0820   CO2 28 04/12/2014 1341   GLUCOSE 119 (H) 07/23/2017 0820   GLUCOSE 85 04/12/2014 1341   BUN 84 (H) 07/23/2017 0820   BUN 40 (H) 04/12/2014 1341   CREATININE 10.59 (H) 07/23/2017 0820   CREATININE 9.58 (H) 04/12/2014 1341   CALCIUM 8.6 (L) 07/23/2017 0820   CALCIUM 9.5 04/12/2014 1341   GFRNONAA 5 (L) 07/23/2017 0820   GFRNONAA 6 (L) 03/15/2014 2323   GFRNONAA 6 (L) 02/10/2014 1119   GFRAA 5 (L) 07/23/2017 0820   GFRAA 7 (L) 03/15/2014 2323   GFRAA 7 (L) 02/10/2014 1119    Assessment/Planning: POD #1 s/p catheter removal for  infection   Discussed the case with nephrology  Will need a fistulogram to see if the fistula can be made usable but would not likely be immediately usable.  If today's blood cultures are negative, we can likely place a new PermCath on Friday  We can probably do a fistulogram at the same time on Friday.    Leotis Pain  07/23/2017, 1:15 PM

## 2017-07-23 NOTE — Progress Notes (Signed)
[facility]  ROUNDING NOTE   Subjective:   Hyperkalemic this morning. Given kayexalate.   Patient had permcath removed yesterday.   Blood cultures 2/26 no growth x 1 day  Objective:  Vital signs in last 24 hours:  Temp:  [97.5 F (36.4 C)-97.9 F (36.6 C)] 97.6 F (36.4 C) (02/27 1237) Pulse Rate:  [58-66] 63 (02/27 1237) Resp:  [16] 16 (02/27 0417) BP: (97-123)/(60-84) 97/60 (02/27 1237) SpO2:  [97 %-98 %] 97 % (02/27 1237) Weight:  [86.2 kg (190 lb)] 86.2 kg (190 lb) (02/27 0417)  Weight change: -2.2 kg (-13.6 oz) Filed Weights   07/22/17 0500 07/22/17 0927 07/23/17 0417  Weight: 85.5 kg (188 lb 7.9 oz) 85.5 kg (188 lb 7.9 oz) 86.2 kg (190 lb)    Intake/Output: I/O last 3 completed shifts: In: 580 [P.O.:480; IV Piggyback:100] Out: 2138 [Other:2138]   Intake/Output this shift:  Total I/O In: 480 [P.O.:480] Out: -   Physical Exam: General: NAD  Head: Normocephalic, atraumatic. Moist oral mucosal membranes  Eyes: Anicteric, PERRL  Neck: Supple, trachea midline  Lungs:  Clear to auscultation  Heart: Regular rate and rhythm  Abdomen:  Soft, nontender,   Extremities: no peripheral edema.  Neurologic: Nonfocal, moving all four extremities  Skin: No lesions  Access:  left AVF maturing    Basic Metabolic Panel: Recent Labs  Lab 07/21/17 0659 07/22/17 0336 07/23/17 0649 07/23/17 0820  NA 132* 132* 133* 132*  K 7.0* 6.1* 6.3* 6.1*  CL 96* 94* 95* 96*  CO2 22 23 22 22   GLUCOSE 91 135* 145* 119*  BUN 86* 70* 86* 84*  CREATININE 12.64* 10.32* 10.68* 10.59*  CALCIUM 9.6 8.7* 8.8* 8.6*  PHOS  --   --  5.9*  --     Liver Function Tests: Recent Labs  Lab 07/21/17 0659 07/23/17 0649  AST 31  --   ALT 30  --   ALKPHOS 140*  --   BILITOT 1.0  --   PROT 9.7*  --   ALBUMIN 3.6 2.9*   No results for input(s): LIPASE, AMYLASE in the last 168 hours. No results for input(s): AMMONIA in the last 168 hours.  CBC: Recent Labs  Lab  07/21/17 0613 07/22/17 0336  WBC 9.7 11.3*  NEUTROABS 6.0  --   HGB 13.5 12.7*  HCT 41.9 39.3*  MCV 87.8 87.8  PLT 175 139*    Cardiac Enzymes: Recent Labs  Lab 07/21/17 0659  TROPONINI 0.19*    BNP: Invalid input(s): POCBNP  CBG: No results for input(s): GLUCAP in the last 168 hours.  Microbiology: Results for orders placed or performed during the hospital encounter of 07/21/17  Blood culture (routine x 2)     Status: Abnormal (Preliminary result)   Collection Time: 07/21/17  9:13 AM  Result Value Ref Range Status   Specimen Description   Final    BLOOD RIGHT ARM Performed at [facility], [address] [place], [place] [address]    Special Requests   Final    BOTTLES DRAWN AEROBIC AND ANAEROBIC Blood Culture adequate volume Performed at [facility], [address], [place] [address]    Culture  Setup Time   Final    GRAM POSITIVE COCCI IN BOTH AEROBIC AND ANAEROBIC BOTTLES CRITICAL RESULT CALLED TO, READ BACK BY AND VERIFIED WITH: DAVID BESANTI ON 07/22/17 AT 0012 JAG GRAM STAIN REVIEWED-AGREE WITH RESULT Performed at [facility], [place] [address] [place], [place] [address]  Culture STAPHYLOCOCCUS AUREUS (A)  Final   Report Status PENDING  Incomplete  Blood culture (routine x 2)     Status: Abnormal (Preliminary result)   Collection Time: 07/21/17  9:13 AM  Result Value Ref Range Status   Specimen Description   Final    BLOOD RIGHT [facility]  Performed at [facility], [address], [place], [place] [address]    Special Requests   Final    BOTTLES DRAWN AEROBIC AND ANAEROBIC Blood Culture adequate volume Performed at [facility], [address] [place], [place] [address]    Culture  Setup Time   Final    GRAM POSITIVE COCCI IN BOTH AEROBIC AND ANAEROBIC BOTTLES CRITICAL VALUE NOTED.  VALUE IS CONSISTENT WITH PREVIOUSLY REPORTED AND CALLED VALUE. JAG GRAM STAIN REVIEWED-AGREE WITH RESULT Performed  at [facility], [place] [address], [place], [place] [address]    Culture STAPHYLOCOCCUS AUREUS (A)  Final   Report Status PENDING  Incomplete  Blood Culture ID Panel (Reflexed)     Status: Abnormal   Collection Time: 07/21/17  9:13 AM  Result Value Ref Range Status   Enterococcus species NOT DETECTED NOT DETECTED Final   Listeria monocytogenes NOT DETECTED NOT DETECTED Final   Staphylococcus species DETECTED (A) NOT DETECTED Final    Comment: CRITICAL RESULT CALLED TO, READ BACK BY AND VERIFIED WITH: DAVID BESANTI ON 07/22/17 AT 0012 BY JAG    Staphylococcus aureus DETECTED (A) NOT DETECTED Final    Comment: Methicillin (oxacillin) susceptible Staphylococcus aureus (MSSA). Preferred therapy is anti staphylococcal beta lactam antibiotic (Cefazolin or Nafcillin), unless clinically contraindicated. CRITICAL RESULT CALLED TO, READ BACK BY AND VERIFIED WITH: DAVID BESANTI ON 07/22/17 AT 0012 BY JAG    Methicillin resistance NOT DETECTED NOT DETECTED Final   Streptococcus species NOT DETECTED NOT DETECTED Final   Streptococcus agalactiae NOT DETECTED NOT DETECTED Final   Streptococcus pneumoniae NOT DETECTED NOT DETECTED Final   Streptococcus pyogenes NOT DETECTED NOT DETECTED Final   Acinetobacter baumannii NOT DETECTED NOT DETECTED Final   Enterobacteriaceae species NOT DETECTED NOT DETECTED Final   Enterobacter cloacae complex NOT DETECTED NOT DETECTED Final   Escherichia coli NOT DETECTED NOT DETECTED Final   Klebsiella oxytoca NOT DETECTED NOT DETECTED Final   Klebsiella pneumoniae NOT DETECTED NOT DETECTED Final   Proteus species NOT DETECTED NOT DETECTED Final   Serratia marcescens NOT DETECTED NOT DETECTED Final   Haemophilus influenzae NOT DETECTED NOT DETECTED Final   Neisseria meningitidis NOT DETECTED NOT DETECTED Final   Pseudomonas aeruginosa NOT DETECTED NOT DETECTED Final   Candida albicans NOT DETECTED NOT DETECTED Final   Candida glabrata NOT DETECTED NOT DETECTED  Final   Candida krusei NOT DETECTED NOT DETECTED Final   Candida parapsilosis NOT DETECTED NOT DETECTED Final   Candida tropicalis NOT DETECTED NOT DETECTED Final    Comment: Performed at [facility], [address] [place], [place] [address]  Culture, blood (Routine X 2) w Reflex to ID Panel     Status: None (Preliminary result)   Collection Time: 07/22/17  8:01 AM  Result Value Ref Range Status   Specimen Description BLOOD BLOOD RIGHT HAND  Final   Special Requests   Final    BOTTLES DRAWN AEROBIC AND ANAEROBIC Blood Culture adequate volume   Culture   Final    NO GROWTH 1 DAY Performed at [facility], [address] [place], [place] [address]    Report Status PENDING  Incomplete  Culture, blood (Routine X 2) w Reflex to ID Panel     Status: None (Preliminary result)   Collection Time: 07/22/17  8:01 AM  Result Value Ref Range Status   Specimen Description BLOOD RIGHT ANTECUBITAL  Final   Special Requests   Final    BOTTLES DRAWN AEROBIC AND ANAEROBIC Blood Culture adequate volume   Culture   Final    NO GROWTH 1 DAY Performed at [facility], [address], [place], [place] [address]    Report Status PENDING  Incomplete    Coagulation Studies: Recent Labs    07/21/17 1020  LABPROT 18.2*  INR 1.52    Urinalysis: No results for input(s): COLORURINE, LABSPEC, PHURINE, GLUCOSEU, HGBUR, BILIRUBINUR, KETONESUR, PROTEINUR, UROBILINOGEN, NITRITE, LEUKOCYTESUR in the last 72 hours.  Invalid input(s): APPERANCEUR    Imaging: No results found.   Medications:   . sodium chloride    .  ceFAZolin (ANCEF) IV Stopped (07/22/17 2056)   . apixaban  5 mg Oral BID  . aspirin EC  325 mg Oral Daily  . atorvastatin  10 mg Oral QHS  . calcium acetate  667 mg Oral Q breakfast  . carvedilol  3.125 mg Oral BID WC  . dextrose  25 mL Intravenous Once  . insulin aspart  10 Units Intravenous Once  . isosorbide mononitrate  30 mg Oral Daily  .  loratadine  10 mg Oral Daily  . mouth rinse  15 mL Mouth Rinse BID  . midodrine  5 mg Oral BID  . mometasone-formoterol  2 puff Inhalation BID  . multivitamin with minerals  1 tablet Oral BID  . pantoprazole  40 mg Oral Daily  . [START ON 07/24/2017] predniSONE  50 mg Oral Q breakfast  . pregabalin  75 mg Oral Daily  . sevelamer carbonate  800 mg Oral TID WC  . sodium chloride flush  3 mL Intravenous Q12H  . tiotropium  18 mcg Inhalation Daily     Assessment/ Plan:  Mr. CLEMENTS TORO is a 62 y.o. black male with ESRD on hemodialysis, hypertension, hyperlipidemia, anemia, COPD, peripheral vascular disease, left BKA, right toe amputations  MWF [facility].   1. End stage renal disease with hyperkalemia. No dialysis access currently. AVF does not seem viable for use at this time.  Catheter related infection - Pending blood cultures before new tunneled catheter is placed.  - Appreciate vascular and ID input.  - kayexalate as needed.   2. Anemia of chronic kidney disease: hemoglobin 12.7 - EPO as outpatient.   3. Hypotension - midodrine before dialysis.   4. Secondary Hyperparathyroidism: - calcium acetate and sevelamer with meals.     LOS: 2 Shalen Petrak 2/27/20196:34 PM

## 2017-07-23 NOTE — Progress Notes (Signed)
[place] [facility] [place] NAME: [name]    MR#:  [id number]  DATE OF BIRTH:  07/03/1955  SUBJECTIVE:   No acute issues overnight.Tunneled IJ dialysis catheter was removed due to bacteremia    REVIEW OF SYSTEMS:    Review of Systems  Constitutional: Negative for fever, chills weight loss HENT: Negative for ear pain, nosebleeds, congestion, facial swelling, rhinorrhea, neck pain, neck stiffness and ear discharge.   Respiratory: Negative for cough, shortness of breath, wheezing  Cardiovascular: Negative for chest pain, palpitations and leg swelling.  Gastrointestinal: Negative for heartburn, abdominal pain, vomiting, diarrhea or consitpation Genitourinary: Negative for dysuria, urgency, frequency, hematuria Musculoskeletal: Negative for back pain or joint pain Neurological: Negative for dizziness, seizures, syncope, focal weakness,  numbness and headaches.  Hematological: Does not bruise/bleed easily.  Psychiatric/Behavioral: Negative for hallucinations, confusion, dysphoric mood    Tolerating Diet: yes      DRUG ALLERGIES:   Allergies  Allergen Reactions  . Dust Mite Extract Other (See Comments)    Reaction: unknown    VITALS:  Blood pressure 123/84, pulse (!) 58, temperature 97.9 F (36.6 C), temperature source Oral, resp. rate 16, height 6\' 2"  (1.88 m), weight 86.2 kg (190 lb), SpO2 98 %.  PHYSICAL EXAMINATION:  Constitutional: Appears well-developed and well-nourished. No distress. HENT: Normocephalic. [name] Oropharynx is clear and moist.  Eyes: Conjunctivae and EOM are normal. PERRLA, no scleral icterus.  Neck: Normal ROM. Neck supple. No JVD. No tracheal deviation. CVS: RRR, S1/S2 +, no murmurs, no gallops, no carotid bruit.  Pulmonary: Effort and breath sounds normal, no stridor, rhonchi, wheezes, rales.  Abdominal: Soft. BS +,  no distension, tenderness, rebound or guarding.  Musculoskeletal: Normal range of motion. No  edema and no tenderness.  Neuro: Alert. CN 2-12 grossly intact. No focal deficits. Skin: Skin is warm and dry. No rash noted. Psychiatric: Normal mood and affect.      LABORATORY PANEL:   CBC Recent Labs  Lab 07/22/17 0336  WBC 11.3*  HGB 12.7*  HCT 39.3*  PLT 139*   ------------------------------------------------------------------------------------------------------------------  Chemistries  Recent Labs  Lab 07/21/17 0659  07/23/17 0820  NA 132*   < > 132*  K 7.0*   < > 6.1*  CL 96*   < > 96*  CO2 22   < > 22  GLUCOSE 91   < > 119*  BUN 86*   < > 84*  CREATININE 12.64*   < > 10.59*  CALCIUM 9.6   < > 8.6*  AST 31  --   --   ALT 30  --   --   ALKPHOS 140*  --   --   BILITOT 1.0  --   --    < > = values in this interval not displayed.   ------------------------------------------------------------------------------------------------------------------  Cardiac Enzymes Recent Labs  Lab 07/21/17 0659  TROPONINI 0.19*   ------------------------------------------------------------------------------------------------------------------  RADIOLOGY:  No results found.   ASSESSMENT AND PLAN:   62 year old male with end-stage renal disease on hemodialysis who presents with shortness of breath.  1. Acute on chronic hypoxic respiratory failure: Patient normally wears 2 L of oxygen. CT scan shows old pulmonary emboli. No evidence of pneumonia. His shortness of breath has improved after dialysis. It appears that he did not get his full dialysis on Friday and this led to pulmonary edema which led to shortness of breath. He also had mild COPD exacerbation   2. Acute bronchitis with acute exacerbation  of COPD: Changed to oral steroids.   Continue inhalers and nebulizer treatments 3. Hyperkalemia: Potassium at 6.1 this morning I will defer this to nephrology  4. MSSA Bacteremia: This is associated with tunneled IJ hemodialysis catheter which has now been  removed. Follow-up blood cultures are negative so far Recommendations are for Ancef which will be dosed at hemodialysis for total 4 weeks.  5. ESRD on dialysis: Plan to see if patient continues AV fistula if not patient will need another access site.  6. Elevated troponin: This is due to demand ischemia with poor renal clearance. Patient is ruled out for ACS   7.Tobacco dependence: Patient is encouraged to quit smoking. Counseling was provided for 4 minutes.  8. Chronic systolic and diastolic heart failure without signs of exacerbation Continue to monitor intake and output and daily weight  9 Known pulmonary emboli: Continue anticoagulation with Eliquis  10. Essential hypertension/CAD: Continue Coreg and isosorbide Continue statin and aspirin   11. GERD: Continue PPI    D/w dr Sherlon Handing    Management plans discussed with the patient and he is in agreement.  CODE STATUS: full  TOTAL TIME TAKING CARE OF THIS PATIENT: 30 minutes.     POSSIBLE D/C 2-3 days, DEPENDING ON CLINICAL CONDITION.   [name] M.D on 07/23/2017 at 9:50 AM  Between 7am to 6pm - Pager - [phone] After 6pm go to [email or web address] - password [email or web address] [facility]  Office  [phone]  CC: Primary care physician; Donnie Coffin, MD  Note: This dictation was prepared with Dragon dictation along with smaller phrase technology. Any transcriptional errors that result from this process are unintentional.

## 2017-07-23 NOTE — Progress Notes (Signed)
[facility] INFECTIOUS DISEASE PROGRESS NOTE Date of Admission:  07/21/2017     ID: [name] is a 62 y.o. male with MSSA bacteremia from infected HD cath Active Problems:   Hyperkalemia   Subjective: NO fevers. FU bcx 2.26 ngtd  ROS  Eleven systems are reviewed and negative except per hpi  Medications:  Antibiotics Given (last 72 hours)    Date/Time Action Medication Dose Rate   07/21/17 0956 New Bag/Given   levofloxacin (LEVAQUIN) IVPB 750 mg 750 mg 100 mL/hr   07/21/17 2040 Given   doxycycline (VIBRA-TABS) tablet 100 mg 100 mg    07/22/17 0332 New Bag/Given   ceFAZolin (ANCEF) IVPB 2g/100 mL premix 2 g 200 mL/hr   07/22/17 1130 New Bag/Given   vancomycin (VANCOCIN) IVPB 1000 mg/200 mL premix 1,000 mg 200 mL/hr   07/22/17 1756 New Bag/Given   ceFAZolin (ANCEF) IVPB 1 g/50 mL premix 1 g 100 mL/hr     . apixaban  5 mg Oral BID  . aspirin EC  325 mg Oral Daily  . atorvastatin  10 mg Oral QHS  . calcium acetate  667 mg Oral Q breakfast  . carvedilol  3.125 mg Oral BID WC  . dextrose  25 mL Intravenous Once  . insulin aspart  10 Units Intravenous Once  . isosorbide mononitrate  30 mg Oral Daily  . loratadine  10 mg Oral Daily  . mouth rinse  15 mL Mouth Rinse BID  . midodrine  5 mg Oral BID  . mometasone-formoterol  2 puff Inhalation BID  . multivitamin with minerals  1 tablet Oral BID  . pantoprazole  40 mg Oral Daily  . [START ON 07/24/2017] predniSONE  50 mg Oral Q breakfast  . pregabalin  75 mg Oral Daily  . sevelamer carbonate  800 mg Oral TID WC  . sodium chloride flush  3 mL Intravenous Q12H  . tiotropium  18 mcg Inhalation Daily    Objective: Vital signs in last 24 hours: Temp:  [97.5 F (36.4 C)-98.6 F (37 C)] 97.6 F (36.4 C) (02/27 1237) Pulse Rate:  [58-67] 63 (02/27 1237) Resp:  [16] 16 (02/27 0417) BP: (97-123)/(60-84) 97/60 (02/27 1237) SpO2:  [96 %-98 %] 97 % (02/27 1237) Weight:  [86.2 kg (190 lb)] 86.2 kg (190 lb) (02/27  0417) Constitutional: He is oriented to person, place, and time. He appears well-developed and well-nourished. No distress.  HENT:  Mouth/Throat: Oropharynx is clear and moist. No oropharyngeal exudate.  L ant chest wall with tunneled prior IJ cath site with mild TTP Cardiovascular: Normal rate, regular rhythm and normal heart sounds.  Pulmonary/Chest: Effort normal and breath sounds normal. No respiratory distress. He has no wheezes.  Abdominal: Soft. Bowel sounds are normal. He exhibits no distension. There is no tenderness.  Lymphadenopathy:  He has no cervical adenopathy.  Neurological: He is alert and oriented to person, place, and time.  Skin: Skin is warm and dry. No rash noted. No erythema.  Psychiatric: He has a normal mood and affect. His behavior is normal.   Lab Results Recent Labs    07/21/17 0613  07/22/17 0336 07/23/17 0649 07/23/17 0820  WBC 9.7  --  11.3*  --   --   HGB 13.5  --  12.7*  --   --   HCT 41.9  --  39.3*  --   --   NA  --    < > 132* 133* 132*  K  --    < >  6.1* 6.3* 6.1*  CL  --    < > 94* 95* 96*  CO2  --    < > 23 22 22   BUN  --    < > 70* 86* 84*  CREATININE  --    < > 10.32* 10.68* 10.59*   < > = values in this interval not displayed.    Microbiology: Results for orders placed or performed during the hospital encounter of 07/21/17  Blood culture (routine x 2)     Status: Abnormal (Preliminary result)   Collection Time: 07/21/17  9:13 AM  Result Value Ref Range Status   Specimen Description   Final    BLOOD RIGHT ARM Performed at [facility], [address] [place], [place] [address]    Special Requests   Final    BOTTLES DRAWN AEROBIC AND ANAEROBIC Blood Culture adequate volume Performed at [facility], [address] [place], [place] [address]    Culture  Setup Time   Final    GRAM POSITIVE COCCI IN BOTH AEROBIC AND ANAEROBIC BOTTLES CRITICAL RESULT CALLED TO, READ BACK BY AND VERIFIED WITH: Diera Wirkkala BESANTI ON  07/22/17 AT 0012 JAG GRAM STAIN REVIEWED-AGREE WITH RESULT Performed at [facility], [place] [address] [place], [place] [address]    Culture STAPHYLOCOCCUS AUREUS (A)  Final   Report Status PENDING  Incomplete  Blood culture (routine x 2)     Status: Abnormal (Preliminary result)   Collection Time: 07/21/17  9:13 AM  Result Value Ref Range Status   Specimen Description   Final    BLOOD RIGHT [facility]  Performed at [facility], [address] [place], [place] [address]    Special Requests   Final    BOTTLES DRAWN AEROBIC AND ANAEROBIC Blood Culture adequate volume Performed at [facility], [address] [place], [place] [address]    Culture  Setup Time   Final    GRAM POSITIVE COCCI IN BOTH AEROBIC AND ANAEROBIC BOTTLES CRITICAL VALUE NOTED.  VALUE IS CONSISTENT WITH PREVIOUSLY REPORTED AND CALLED VALUE. JAG GRAM STAIN REVIEWED-AGREE WITH RESULT Performed at [facility], [place] [address] [place], [place] [address]    Culture STAPHYLOCOCCUS AUREUS (A)  Final   Report Status PENDING  Incomplete  Blood Culture ID Panel (Reflexed)     Status: Abnormal   Collection Time: 07/21/17  9:13 AM  Result Value Ref Range Status   Enterococcus species NOT DETECTED NOT DETECTED Final   Listeria monocytogenes NOT DETECTED NOT DETECTED Final   Staphylococcus species DETECTED (A) NOT DETECTED Final    Comment: CRITICAL RESULT CALLED TO, READ BACK BY AND VERIFIED WITH: Nyellie Yetter BESANTI ON 07/22/17 AT 0012 BY JAG    Staphylococcus aureus DETECTED (A) NOT DETECTED Final    Comment: Methicillin (oxacillin) susceptible Staphylococcus aureus (MSSA). Preferred therapy is anti staphylococcal beta lactam antibiotic (Cefazolin or Nafcillin), unless clinically contraindicated. CRITICAL RESULT CALLED TO, READ BACK BY AND VERIFIED WITH: Mazikeen Hehn BESANTI ON 07/22/17 AT 0012 BY JAG    Methicillin resistance NOT DETECTED NOT DETECTED Final   Streptococcus species NOT DETECTED NOT  DETECTED Final   Streptococcus agalactiae NOT DETECTED NOT DETECTED Final   Streptococcus pneumoniae NOT DETECTED NOT DETECTED Final   Streptococcus pyogenes NOT DETECTED NOT DETECTED Final   Acinetobacter baumannii NOT DETECTED NOT DETECTED Final   Enterobacteriaceae species NOT DETECTED NOT DETECTED Final   Enterobacter cloacae complex NOT DETECTED NOT DETECTED Final   Escherichia coli NOT  DETECTED NOT DETECTED Final   Klebsiella oxytoca NOT DETECTED NOT DETECTED Final   Klebsiella pneumoniae NOT DETECTED NOT DETECTED Final   Proteus species NOT DETECTED NOT DETECTED Final   Serratia marcescens NOT DETECTED NOT DETECTED Final   Haemophilus influenzae NOT DETECTED NOT DETECTED Final   Neisseria meningitidis NOT DETECTED NOT DETECTED Final   Pseudomonas aeruginosa NOT DETECTED NOT DETECTED Final   Candida albicans NOT DETECTED NOT DETECTED Final   Candida glabrata NOT DETECTED NOT DETECTED Final   Candida krusei NOT DETECTED NOT DETECTED Final   Candida parapsilosis NOT DETECTED NOT DETECTED Final   Candida tropicalis NOT DETECTED NOT DETECTED Final    Comment: Performed at [facility], [address] [place], [place] [address]  Culture, blood (Routine X 2) w Reflex to ID Panel     Status: None (Preliminary result)   Collection Time: 07/22/17  8:01 AM  Result Value Ref Range Status   Specimen Description BLOOD BLOOD RIGHT HAND  Final   Special Requests   Final    BOTTLES DRAWN AEROBIC AND ANAEROBIC Blood Culture adequate volume   Culture   Final    NO GROWTH 1 DAY Performed at [facility], [address], [place], [place] [address]    Report Status PENDING  Incomplete  Culture, blood (Routine X 2) w Reflex to ID Panel     Status: None (Preliminary result)   Collection Time: 07/22/17  8:01 AM  Result Value Ref Range Status   Specimen Description BLOOD RIGHT ANTECUBITAL  Final   Special Requests   Final    BOTTLES DRAWN AEROBIC AND ANAEROBIC Blood Culture  adequate volume   Culture   Final    NO GROWTH 1 DAY Performed at [facility], [address], [place], [place] [address]    Report Status PENDING  Incomplete    Studies/Results: No results found.  Assessment/Plan: EMILLIANO DILWORTH is a 62 y.o. male with MSSA bacteremia associated with R tunneled IJ HD cath which has now been removed. Site had been tender so was likely source of infection. FU BCX NGTD 2/26/  Recommendations Check Echo Cont ancef- Can be dosed at HD. Will plan min 4 week course at HD Per vascular will need new line- can be placed once bcx neg 48 hours.   Thank you very much for the consult. Will follow with you.  Leonel Ramsay   07/23/2017, 4:06 PM

## 2017-07-24 LAB — BASIC METABOLIC PANEL
Anion gap: 16 — ABNORMAL HIGH (ref 5–15)
BUN: 99 mg/dL — AB (ref 6–20)
CALCIUM: 8 mg/dL — AB (ref 8.9–10.3)
CO2: 23 mmol/L (ref 22–32)
Chloride: 94 mmol/L — ABNORMAL LOW (ref 101–111)
Creatinine, Ser: 12.34 mg/dL — ABNORMAL HIGH (ref 0.61–1.24)
GFR calc Af Amer: 4 mL/min — ABNORMAL LOW (ref >60)
GFR, EST NON AFRICAN AMERICAN: 4 mL/min — AB (ref >60)
Glucose, Bld: 110 mg/dL — ABNORMAL HIGH (ref 65–99)
Potassium: 4.9 mmol/L (ref 3.5–5.1)
Sodium: 133 mmol/L — ABNORMAL LOW (ref 135–145)

## 2017-07-24 LAB — ECHOCARDIOGRAM COMPLETE
HEIGHTINCHES: 74 in
WEIGHTICAEL: 3040 [oz_av]

## 2017-07-24 LAB — MRSA PCR SCREENING: MRSA BY PCR: POSITIVE — AB

## 2017-07-24 LAB — CULTURE, BLOOD (ROUTINE X 2)
SPECIAL REQUESTS: ADEQUATE
Special Requests: ADEQUATE

## 2017-07-24 MED ORDER — PATIROMER SORBITEX CALCIUM 8.4 G PO PACK
8.4000 g | PACK | Freq: Every day | ORAL | Status: DC
  Administered 2017-07-24: 8.4 g via ORAL
  Filled 2017-07-24 (×2): qty 4

## 2017-07-24 MED ORDER — NEPRO/CARBSTEADY PO LIQD
237.0000 mL | Freq: Two times a day (BID) | ORAL | Status: DC
  Administered 2017-07-24: 237 mL via ORAL

## 2017-07-24 MED ORDER — RENA-VITE PO TABS
1.0000 | ORAL_TABLET | Freq: Every day | ORAL | Status: DC
Start: 2017-07-24 — End: 2017-07-26
  Administered 2017-07-24 – 2017-07-25 (×2): 1 via ORAL
  Filled 2017-07-24 (×2): qty 1

## 2017-07-24 MED ORDER — ADULT MULTIVITAMIN W/MINERALS CH: 1.0000 | ORAL_TABLET | ORAL | Status: DC

## 2017-07-24 NOTE — Progress Notes (Signed)
Per Dr. Benjie Karvonen hold carvedilol and give Imdur. BP 117/78 and pulse of 58. Pt reports that he takes midodrine even on non dialysis days.

## 2017-07-24 NOTE — Progress Notes (Signed)
[facility] NAME: [name]    MR#:  [id number]  DATE OF BIRTH:  19-May-1956  SUBJECTIVE:   No issues overnight No SOB or chest pain   REVIEW OF SYSTEMS:    Review of Systems  Constitutional: Negative for fever, chills weight loss HENT: Negative for ear pain, nosebleeds, congestion, facial swelling, rhinorrhea, neck pain, neck stiffness and ear discharge.   Respiratory: Negative for cough, shortness of breath, wheezing  Cardiovascular: Negative for chest pain, palpitations and leg swelling.  Gastrointestinal: Negative for heartburn, abdominal pain, vomiting, diarrhea or consitpation Genitourinary: Negative for dysuria, urgency, frequency, hematuria Musculoskeletal: Negative for back pain or joint pain Neurological: Negative for dizziness, seizures, syncope, focal weakness,  numbness and headaches.  Hematological: Does not bruise/bleed easily.  Psychiatric/Behavioral: Negative for hallucinations, confusion, dysphoric mood    Tolerating Diet: yes      DRUG ALLERGIES:   Allergies  Allergen Reactions  . Dust Mite Extract Other (See Comments)    Reaction: unknown    VITALS:  Blood pressure 108/74, pulse (!) 58, temperature (!) 97.4 F (36.3 C), temperature source Oral, resp. rate 14, height 6\' 2"  (1.88 m), weight 87.7 kg (193 lb 6.4 oz), SpO2 99 %.  PHYSICAL EXAMINATION:  Constitutional: Appears well-developed and well-nourished. No distress. HENT: Normocephalic. [name] Oropharynx is clear and moist.  Eyes: Conjunctivae and EOM are normal. PERRLA, no scleral icterus.  Neck: Normal ROM. Neck supple. No JVD. No tracheal deviation. CVS: RRR, S1/S2 +, no murmurs, no gallops, no carotid bruit.  Pulmonary: Effort and breath sounds normal, no stridor, rhonchi, wheezes, rales.  Abdominal: Soft. BS +,  no distension, tenderness, rebound or guarding.  Musculoskeletal: left AKA No edema and no tenderness.  Neuro: Alert. CN 2-12  grossly intact. No focal deficits. Skin: Skin is warm and dry. No rash noted. Psychiatric: Normal mood and affect.      LABORATORY PANEL:   CBC Recent Labs  Lab 07/22/17 0336  WBC 11.3*  HGB 12.7*  HCT 39.3*  PLT 139*   ------------------------------------------------------------------------------------------------------------------  Chemistries  Recent Labs  Lab 07/21/17 0659  07/24/17 0036  NA 132*   < > 133*  K 7.0*   < > 4.9  CL 96*   < > 94*  CO2 22   < > 23  GLUCOSE 91   < > 110*  BUN 86*   < > 99*  CREATININE 12.64*   < > 12.34*  CALCIUM 9.6   < > 8.0*  AST 31  --   --   ALT 30  --   --   ALKPHOS 140*  --   --   BILITOT 1.0  --   --    < > = values in this interval not displayed.   ------------------------------------------------------------------------------------------------------------------  Cardiac Enzymes Recent Labs  Lab 07/21/17 0659  TROPONINI 0.19*   ------------------------------------------------------------------------------------------------------------------  RADIOLOGY:  No results found.   ASSESSMENT AND PLAN:   62 year old male with end-stage renal disease on hemodialysis who presents with shortness of breath.  1. Acute on chronic hypoxic respiratory failure: Patient normally wears 2 L of oxygen. CT scan shows old pulmonary emboli. No evidence of pneumonia. His shortness of breath has improved after dialysis. It appears that he did not get his full dialysis on Friday and this led to pulmonary edema which led to shortness of breath. He also had mild COPD exacerbation   2. Acute bronchitis with acute exacerbation of COPD: Changed to  oral steroids.   Continue inhalers and nebulizer treatments  3. Hyperkalemia: Potassium at 4.9 this morning  4. MSSA Bacteremia: This is associated with tunneled IJ hemodialysis catheter which has now been removed. Follow-up blood cultures are negative so far Recommendations are for Ancef which  will be dosed at hemodialysis for total 4 weeks. ECHO pendin  5. ESRD on dialysis: Plan for AV fistulogram tomorrow and Permcath tomorrow  6. Elevated troponin: This is due to demand ischemia with poor renal clearance. Patient is ruled out for ACS   7.Tobacco dependence: Patient is encouraged to quit smoking. Counseling was provided for 4 minutes.  8. Chronic systolic and diastolic heart failure without signs of exacerbation Continue to monitor intake and output and daily weight  9 Known pulmonary emboli: Continue anticoagulation with Eliquis  10. Essential hypertension/CAD: Continue Coreg and isosorbide Continue statin and aspirin   11. GERD: Continue PPI    D/w dr Sherlon Handing    Management plans discussed with the patient and he is in agreement.  CODE STATUS: full  TOTAL TIME TAKING CARE OF THIS PATIENT: 22 minutes.     POSSIBLE D/C 2-3 days, DEPENDING ON CLINICAL CONDITION.   Jackee Glasner M.D on 07/24/2017 at 12:19 PM  Between 7am to 6pm - Pager - [phone] After 6pm go to [email or web address] - password [email or web address] [facility]  Office  [phone]  CC: Primary care physician; Donnie Coffin, MD  Note: This dictation was prepared with Dragon dictation along with smaller phrase technology. Any transcriptional errors that result from this process are unintentional.

## 2017-07-24 NOTE — Progress Notes (Signed)
[facility]  ROUNDING NOTE   Subjective:   Holding dialysis today. K 4.2   Patient without dialysis access  Friend at bedside.   Objective:  Vital signs in last 24 hours:  Temp:  [97.4 F (36.3 C)-98.1 F (36.7 C)] 97.4 F (36.3 C) (02/28 1217) Pulse Rate:  [58-65] 58 (02/28 1217) Resp:  [14-16] 14 (02/28 1217) BP: (103-117)/(61-78) 108/74 (02/28 1217) SpO2:  [99 %-100 %] 99 % (02/28 1217) Weight:  [87.7 kg (193 lb 6.4 oz)] 87.7 kg (193 lb 6.4 oz) (02/28 0442)  Weight change: 2.226 kg (4 lb 14.5 oz) Filed Weights   07/22/17 0927 07/23/17 0417 07/24/17 0442  Weight: 85.5 kg (188 lb 7.9 oz) 86.2 kg (190 lb) 87.7 kg (193 lb 6.4 oz)    Intake/Output: I/O last 3 completed shifts: In: 72 [P.O.:760; IV Piggyback:50] Out: 0    Intake/Output this shift:  Total I/O In: 240 [P.O.:240] Out: 0   Physical Exam: General: NAD  Head: Normocephalic, atraumatic. Moist oral mucosal membranes  Eyes: Anicteric, PERRL  Neck: Supple, trachea midline  Lungs:  Clear to auscultation  Heart: Regular rate and rhythm  Abdomen:  Soft, nontender,   Extremities: no peripheral edema.  Neurologic: Nonfocal, moving all four extremities  Skin: No lesions  Access:  left AVF maturing    Basic Metabolic Panel: Recent Labs  Lab 07/21/17 0659 07/22/17 0336 07/23/17 0649 07/23/17 0820 07/24/17 0036  NA 132* 132* 133* 132* 133*  K 7.0* 6.1* 6.3* 6.1* 4.9  CL 96* 94* 95* 96* 94*  CO2 22 23 22 22 23   GLUCOSE 91 135* 145* 119* 110*  BUN 86* 70* 86* 84* 99*  CREATININE 12.64* 10.32* 10.68* 10.59* 12.34*  CALCIUM 9.6 8.7* 8.8* 8.6* 8.0*  PHOS  --   --  5.9*  --   --     Liver Function Tests: Recent Labs  Lab 07/21/17 0659 07/23/17 0649  AST 31  --   ALT 30  --   ALKPHOS 140*  --   BILITOT 1.0  --   PROT 9.7*  --   ALBUMIN 3.6 2.9*   No results for input(s): LIPASE, AMYLASE in the last 168 hours. No results for input(s): AMMONIA in the last 168 hours.  CBC: Recent  Labs  Lab 07/21/17 0613 07/22/17 0336  WBC 9.7 11.3*  NEUTROABS 6.0  --   HGB 13.5 12.7*  HCT 41.9 39.3*  MCV 87.8 87.8  PLT 175 139*    Cardiac Enzymes: Recent Labs  Lab 07/21/17 0659  TROPONINI 0.19*    BNP: Invalid input(s): POCBNP  CBG: No results for input(s): GLUCAP in the last 168 hours.  Microbiology: Results for orders placed or performed during the hospital encounter of 07/21/17  Blood culture (routine x 2)     Status: Abnormal   Collection Time: 07/21/17  9:13 AM  Result Value Ref Range Status   Specimen Description   Final    BLOOD RIGHT ARM Performed at [facility], [address], [place], [place] [address]    Special Requests   Final    BOTTLES DRAWN AEROBIC AND ANAEROBIC Blood Culture adequate volume Performed at [facility], [address], [place], [place] [address]    Culture  Setup Time   Final    GRAM POSITIVE COCCI IN BOTH AEROBIC AND ANAEROBIC BOTTLES CRITICAL RESULT CALLED TO, READ BACK BY AND VERIFIED WITH: DAVID BESANTI ON 07/22/17 AT [place] Performed at  [facility], [place] [address] [place], [place] [address]    Culture STAPHYLOCOCCUS AUREUS (A)  Final   Report Status 07/24/2017 FINAL  Final   Organism ID, Bacteria STAPHYLOCOCCUS AUREUS  Final      Susceptibility   Staphylococcus aureus - MIC*    CIPROFLOXACIN <=0.5 SENSITIVE Sensitive     ERYTHROMYCIN <=0.25 SENSITIVE Sensitive     GENTAMICIN <=0.5 SENSITIVE Sensitive     OXACILLIN 0.5 SENSITIVE Sensitive     TETRACYCLINE <=1 SENSITIVE Sensitive     VANCOMYCIN <=0.5 SENSITIVE Sensitive     TRIMETH/SULFA <=10 SENSITIVE Sensitive     CLINDAMYCIN <=0.25 SENSITIVE Sensitive     RIFAMPIN <=0.5 SENSITIVE Sensitive     Inducible Clindamycin NEGATIVE Sensitive     * STAPHYLOCOCCUS AUREUS  Blood culture (routine x 2)     Status: Abnormal   Collection Time: 07/21/17  9:13 AM  Result Value Ref Range Status   Specimen  Description   Final    BLOOD RIGHT [facility]  Performed at [facility], [address], [place], [place] [address]    Special Requests   Final    BOTTLES DRAWN AEROBIC AND ANAEROBIC Blood Culture adequate volume Performed at [facility], [address] [place], [place] [address]    Culture  Setup Time   Final    GRAM POSITIVE COCCI IN BOTH AEROBIC AND ANAEROBIC BOTTLES CRITICAL VALUE NOTED.  VALUE IS CONSISTENT WITH PREVIOUSLY REPORTED AND CALLED VALUE. JAG GRAM STAIN REVIEWED-AGREE WITH RESULT    Culture (A)  Final    STAPHYLOCOCCUS AUREUS SUSCEPTIBILITIES PERFORMED ON PREVIOUS CULTURE WITHIN THE LAST 5 DAYS. Performed at [facility], [place] [address], [place], [place] [address]    Report Status 07/24/2017 FINAL  Final  Blood Culture ID Panel (Reflexed)     Status: Abnormal   Collection Time: 07/21/17  9:13 AM  Result Value Ref Range Status   Enterococcus species NOT DETECTED NOT DETECTED Final   Listeria monocytogenes NOT DETECTED NOT DETECTED Final   Staphylococcus species DETECTED (A) NOT DETECTED Final    Comment: CRITICAL RESULT CALLED TO, READ BACK BY AND VERIFIED WITH: DAVID BESANTI ON 07/22/17 AT 0012 BY JAG    Staphylococcus aureus DETECTED (A) NOT DETECTED Final    Comment: Methicillin (oxacillin) susceptible Staphylococcus aureus (MSSA). Preferred therapy is anti staphylococcal beta lactam antibiotic (Cefazolin or Nafcillin), unless clinically contraindicated. CRITICAL RESULT CALLED TO, READ BACK BY AND VERIFIED WITH: DAVID BESANTI ON 07/22/17 AT 0012 BY JAG    Methicillin resistance NOT DETECTED NOT DETECTED Final   Streptococcus species NOT DETECTED NOT DETECTED Final   Streptococcus agalactiae NOT DETECTED NOT DETECTED Final   Streptococcus pneumoniae NOT DETECTED NOT DETECTED Final   Streptococcus pyogenes NOT DETECTED NOT DETECTED Final   Acinetobacter baumannii NOT DETECTED NOT DETECTED Final   Enterobacteriaceae species NOT DETECTED NOT  DETECTED Final   Enterobacter cloacae complex NOT DETECTED NOT DETECTED Final   Escherichia coli NOT DETECTED NOT DETECTED Final   Klebsiella oxytoca NOT DETECTED NOT DETECTED Final   Klebsiella pneumoniae NOT DETECTED NOT DETECTED Final   Proteus species NOT DETECTED NOT DETECTED Final   Serratia marcescens NOT DETECTED NOT DETECTED Final   Haemophilus influenzae NOT DETECTED NOT DETECTED Final   Neisseria meningitidis NOT DETECTED NOT DETECTED Final   Pseudomonas aeruginosa NOT DETECTED NOT DETECTED Final   Candida albicans NOT DETECTED NOT DETECTED Final   Candida glabrata NOT DETECTED NOT DETECTED Final   Candida  krusei NOT DETECTED NOT DETECTED Final   Candida parapsilosis NOT DETECTED NOT DETECTED Final   Candida tropicalis NOT DETECTED NOT DETECTED Final    Comment: Performed at [facility], [address] [place], [place] [address]  Culture, blood (Routine X 2) w Reflex to ID Panel     Status: None (Preliminary result)   Collection Time: 07/22/17  8:01 AM  Result Value Ref Range Status   Specimen Description BLOOD BLOOD RIGHT HAND  Final   Special Requests   Final    BOTTLES DRAWN AEROBIC AND ANAEROBIC Blood Culture adequate volume   Culture   Final    NO GROWTH 2 DAYS Performed at [facility], [address] [place], [place] [address]    Report Status PENDING  Incomplete  Culture, blood (Routine X 2) w Reflex to ID Panel     Status: None (Preliminary result)   Collection Time: 07/22/17  8:01 AM  Result Value Ref Range Status   Specimen Description BLOOD RIGHT ANTECUBITAL  Final   Special Requests   Final    BOTTLES DRAWN AEROBIC AND ANAEROBIC Blood Culture adequate volume   Culture   Final    NO GROWTH 2 DAYS Performed at [facility], [address] [place], [place] [address]    Report Status PENDING  Incomplete  Culture, blood (Routine X 2) w Reflex to ID Panel     Status: None (Preliminary result)   Collection Time: 07/24/17 12:35  AM  Result Value Ref Range Status   Specimen Description BLOOD RT FOA  Final   Special Requests   Final    BOTTLES DRAWN AEROBIC AND ANAEROBIC Blood Culture adequate volume   Culture   Final    NO GROWTH < 12 HOURS Performed at [facility], [address], [place], [place] [address]    Report Status PENDING  Incomplete  Culture, blood (Routine X 2) w Reflex to ID Panel     Status: None (Preliminary result)   Collection Time: 07/24/17 12:48 AM  Result Value Ref Range Status   Specimen Description BLOOD RIGHT HAND  Final   Special Requests   Final    BOTTLES DRAWN AEROBIC AND ANAEROBIC Blood Culture adequate volume   Culture   Final    NO GROWTH < 12 HOURS Performed at [facility], [address] [place], [place] [address]    Report Status PENDING  Incomplete    Coagulation Studies: No results for input(s): LABPROT, INR in the last 72 hours.  Urinalysis: No results for input(s): COLORURINE, LABSPEC, PHURINE, GLUCOSEU, HGBUR, BILIRUBINUR, KETONESUR, PROTEINUR, UROBILINOGEN, NITRITE, LEUKOCYTESUR in the last 72 hours.  Invalid input(s): APPERANCEUR    Imaging: No results found.   Medications:   . sodium chloride    .  ceFAZolin (ANCEF) IV Stopped (07/23/17 2017)   . apixaban  5 mg Oral BID  . aspirin EC  325 mg Oral Daily  . atorvastatin  10 mg Oral QHS  . calcium acetate  667 mg Oral Q breakfast  . carvedilol  3.125 mg Oral BID WC  . dextrose  25 mL Intravenous Once  . feeding supplement (NEPRO CARB STEADY)  237 mL Oral BID BM  . insulin aspart  10 Units Intravenous Once  . isosorbide mononitrate  30 mg Oral Daily  . loratadine  10 mg Oral Daily  . mouth rinse  15 mL Mouth Rinse BID  . midodrine  5 mg Oral BID  . mometasone-formoterol  2 puff Inhalation BID  . multivitamin  1 tablet Oral QHS  . [START ON 07/28/2017] multivitamin with minerals  1 tablet Oral Once per day on Mon Wed Fri  . pantoprazole  40 mg Oral Daily  . patiromer  8.4 g Oral  Daily  . predniSONE  50 mg Oral Q breakfast  . pregabalin  75 mg Oral Daily  . sevelamer carbonate  800 mg Oral TID WC  . sodium chloride flush  3 mL Intravenous Q12H  . tiotropium  18 mcg Inhalation Daily     Assessment/ Plan:  [name] is a 62 y.o. black male with ESRD on hemodialysis, hypertension, hyperlipidemia, anemia, COPD, peripheral vascular disease, left BKA, right toe amputations  MWF [facility] Nephrology [facility]   1. End stage renal disease with hyperkalemia. No dialysis access currently. AVF does not seem viable for use at this time.  Catheter related infection - New catheter to be placed tomorrow. Then resume MWF schedule.  - Appreciate vascular and ID input.  - kayexalate as needed. Veltassa ordered.   2. Anemia of chronic kidney disease:   - EPO as outpatient.   3. Hypotension - midodrine before dialysis.   4. Secondary Hyperparathyroidism: - calcium acetate and sevelamer with meals.    LOS: 3 Malai Lady 2/28/20191:44 PM

## 2017-07-25 ENCOUNTER — Inpatient Hospital Stay
Admission: EM | Disposition: A | Discharge status: Home / Self Care | Payer: Self-pay | Source: Home / Self Care | Attending: Internal Medicine

## 2017-07-25 DIAGNOSIS — N186 End stage renal disease: Secondary | ICD-10-CM

## 2017-07-25 HISTORY — PX: DIALYSIS/PERMA CATHETER INSERTION: CATH118288

## 2017-07-25 LAB — BASIC METABOLIC PANEL
Anion gap: 17 — ABNORMAL HIGH (ref 5–15)
BUN: 123 mg/dL — ABNORMAL HIGH (ref 6–20)
CHLORIDE: 95 mmol/L — AB (ref 101–111)
CO2: 21 mmol/L — ABNORMAL LOW (ref 22–32)
Calcium: 8.6 mg/dL — ABNORMAL LOW (ref 8.9–10.3)
Creatinine, Ser: 13.68 mg/dL — ABNORMAL HIGH (ref 0.61–1.24)
GFR calc non Af Amer: 3 mL/min — ABNORMAL LOW (ref >60)
GFR, EST AFRICAN AMERICAN: 4 mL/min — AB (ref >60)
Glucose, Bld: 110 mg/dL — ABNORMAL HIGH (ref 65–99)
POTASSIUM: 6.2 mmol/L — AB (ref 3.5–5.1)
Sodium: 133 mmol/L — ABNORMAL LOW (ref 135–145)

## 2017-07-25 SURGERY — DIALYSIS/PERMA CATHETER INSERTION
Anesthesia: Moderate Sedation

## 2017-07-25 MED ORDER — PATIROMER SORBITEX CALCIUM 8.4 G PO PACK: 8.4000 g | PACK | Freq: Every day | ORAL | 0 refills | Status: DC

## 2017-07-25 MED ORDER — SODIUM CHLORIDE 0.9 % IV SOLN
INTRAVENOUS | Status: DC
  Administered 2017-07-25: 12:00:00 via INTRAVENOUS

## 2017-07-25 MED ORDER — CEFAZOLIN SODIUM-DEXTROSE 1-4 GM/50ML-% IV SOLN: 1.0000 g | INTRAVENOUS | Status: AC | PRN

## 2017-07-25 MED ORDER — DEXTROSE 50 % IV SOLN
1.0000 | Freq: Once | INTRAVENOUS | Status: AC
  Administered 2017-07-25: 50 mL via INTRAVENOUS
  Filled 2017-07-25: qty 50

## 2017-07-25 MED ORDER — SODIUM POLYSTYRENE SULFONATE 15 GM/60ML PO SUSP
30.0000 g | Freq: Once | ORAL | Status: DC
  Filled 2017-07-25: qty 120

## 2017-07-25 MED ORDER — INSULIN ASPART 100 UNIT/ML IV SOLN
10.0000 [IU] | Freq: Once | INTRAVENOUS | Status: AC
  Administered 2017-07-25: 10 [IU] via INTRAVENOUS
  Filled 2017-07-25: qty 0.1

## 2017-07-25 MED ORDER — FENTANYL CITRATE (PF) 100 MCG/2ML IJ SOLN
INTRAMUSCULAR | Status: DC | PRN
  Administered 2017-07-25: 50 ug via INTRAVENOUS

## 2017-07-25 MED ORDER — MIDAZOLAM HCL 2 MG/2ML IJ SOLN
INTRAMUSCULAR | Status: AC
  Filled 2017-07-25: qty 4

## 2017-07-25 MED ORDER — MIDAZOLAM HCL 2 MG/2ML IJ SOLN
INTRAMUSCULAR | Status: DC | PRN
  Administered 2017-07-25: 2 mg via INTRAVENOUS

## 2017-07-25 MED ORDER — CEFAZOLIN SODIUM-DEXTROSE 1-4 GM/50ML-% IV SOLN: 1.0000 g | INTRAVENOUS | Status: DC

## 2017-07-25 MED ORDER — HEPARIN SODIUM (PORCINE) 10000 UNIT/ML IJ SOLN
INTRAMUSCULAR | Status: AC
  Filled 2017-07-25: qty 1

## 2017-07-25 MED ORDER — FENTANYL CITRATE (PF) 100 MCG/2ML IJ SOLN
INTRAMUSCULAR | Status: AC
  Filled 2017-07-25: qty 2

## 2017-07-25 MED ORDER — PREDNISONE 50 MG PO TABS: ORAL_TABLET | ORAL | 0 refills | Status: DC

## 2017-07-25 MED ORDER — NEPRO/CARBSTEADY PO LIQD: 237.0000 mL | Freq: Two times a day (BID) | ORAL | 0 refills | Status: AC

## 2017-07-25 MED ORDER — LIDOCAINE-EPINEPHRINE (PF) 1 %-1:200000 IJ SOLN
INTRAMUSCULAR | Status: AC
  Filled 2017-07-25: qty 30

## 2017-07-25 SURGICAL SUPPLY — 9 items
CATH PALINDROME-P 44CM KIT (CATHETERS) ×2
KIT CATH 64X44X15FR RVRS (CATHETERS) ×1 IMPLANT
PACK ANGIOGRAPHY (CUSTOM PROCEDURE TRAY) ×3 IMPLANT
SUT MNCRL 4-0 (SUTURE) ×2
SUT MNCRL 4-0 27XMFL (SUTURE) ×1
SUT PROLENE 0 CT 1 30 (SUTURE) ×3 IMPLANT
SUT VICRYL+ 3-0 36IN CT-1 (SUTURE) ×3 IMPLANT
SUTURE MNCRL 4-0 27XMF (SUTURE) ×1 IMPLANT
TOWEL OR 17X26 4PK STRL BLUE (TOWEL DISPOSABLE) ×3 IMPLANT

## 2017-07-25 NOTE — Progress Notes (Signed)
Report called to Aniceto Boss, Therapist, sports, at [facility].  Did inform Aniceto Boss that patient will need Ancef give here prior to discharge and then EMS will be called.  Will notify Aniceto Boss once EMS has been called.  Understanding was verbalized.

## 2017-07-25 NOTE — Discharge Summary (Addendum)
[facility] NAME: [name]    MR#:  [id number]  DATE OF BIRTH:  Jan 30, 1956  DATE OF ADMISSION:  07/21/2017 ADMITTING PHYSICIAN: Bettey Costa, MD  DATE OF DISCHARGE: 07/25/2017  PRIMARY CARE PHYSICIAN: Donnie Coffin, MD    ADMISSION DIAGNOSIS:  Hyperkalemia [E87.5] Hyponatremia [E87.1] SOB (shortness of breath) [R06.02] NSTEMI (non-ST elevated myocardial infarction) ([place]) [I21.4] ESRD on hemodialysis ([place]) [N18.6, Z99.2] Acute on chronic congestive heart failure, unspecified heart failure type ([place]) [I50.9]  DISCHARGE DIAGNOSIS:  Active Problems:   Hyperkalemia   SECONDARY DIAGNOSIS:   Past Medical History:  Diagnosis Date  . 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 Amputee, below knee, left ([place])   . Anemia   . Asthma   . Cardiomyopathy ([name])   . CHF (congestive heart failure) ([place])   . Chronic systolic heart failure ([place])   . Complication of anesthesia    hypotension  . COPD (chronic obstructive pulmonary disease) ([place])   . Coronary artery disease   . Dialysis patient ([place])    Mon, Wed, Fri  . End stage renal disease ([place])   . GERD (gastroesophageal reflux disease)   . Headache   . History of kidney stones   . History of pulmonary embolism   . HLD (hyperlipidemia)   . HTN (hypertension)   . Hyperparathyroidism   . Myocardial infarction ([place])   . Peripheral vascular disease ([place])   . Shortness of breath dyspnea   . Sleep apnea    NO C-PAP, Patient stated in process of  "getting one"   . Tobacco dependence     HOSPITAL COURSE:  62 year old male with end-stage renal disease on hemodialysis who presents with shortness of breath.  1. Acute on chronic hypoxic respiratory failure: Patient normally wears 2 L of oxygen. CT scan shows old pulmonary emboli. No evidence of pneumonia. His shortness of breath has improved after dialysis. It appears that he did not get his full dialysis on Friday and this  led to pulmonary edema which led to shortness of breath. He also had mild COPD exacerbation   2. Acute bronchitis with acute exacerbation of COPD: He will continue oral steroids for 3 days then stop.   Continue inhalers and nebulizer treatments  3. Hyperkalemia: This was treated during HD  4. MSSA Bacteremia: This is associated with tunneled IJ hemodialysis catheter which has now been removed. Follow-up blood cultures are negative so far Recommendations are for Ancef which will be dosed at hemodialysis for total 4 weeks.   5. ESRD on dialysis: He underwent  AVfistulogram and Permcath placement. He will resume HD Monday, Wednesday and Friday  6. Elevated troponin: This is due to demand ischemia with poor renal clearance. Patient is ruled out for ACS   7.Tobacco dependence: Patient is encouraged to quit smoking. Counseling was provided for 4 minutes.  8. Chronic systolic EF 55-73% and diastolic heart failure without signs of exacerbation Continue to monitor intake and output and daily weight CHF clinic at discharge  9 Known pulmonary emboli: Continue anticoagulation withEliquis  10. Essential hypertension/CAD: Continue Coreg and isosorbide Continue statin and aspirin   11. GERD: Continue PPI     DISCHARGE CONDITIONS AND DIET:   Stable renal heart healthy diet  CONSULTS OBTAINED:  Treatment Team:  Lavonia Dana, MD Algernon Huxley, MD Leonel Ramsay, MD  DRUG ALLERGIES:   Allergies  Allergen Reactions  . Dust Mite Extract Other (See  Comments)    Reaction: unknown    DISCHARGE MEDICATIONS:   Allergies as of 07/25/2017      Reactions   Dust Mite Extract Other (See Comments)   Reaction: unknown      Medication List    TAKE these medications   acetaminophen 500 MG tablet Commonly known as:  TYLENOL Take 500 mg by mouth every 6 (six) hours as needed for mild pain.   albuterol 108 (90 Base) MCG/ACT inhaler Commonly known as:  PROVENTIL  HFA;VENTOLIN HFA Inhale 1-2 puffs every 6 (six) hours as needed into the lungs for wheezing or shortness of breath.   apixaban 5 MG Tabs tablet Commonly known as:  ELIQUIS Take 1 tablet (5 mg total) by mouth 2 (two) times daily.   aspirin EC 325 MG tablet Take 325 mg by mouth daily.   atorvastatin 10 MG tablet Commonly known as:  LIPITOR Take 10 mg by mouth at bedtime.   budesonide-formoterol 160-4.5 MCG/ACT inhaler Commonly known as:  SYMBICORT Inhale 2 puffs into the lungs 2 (two) times daily. Reported on 08/14/2015   calcium acetate 667 MG capsule Commonly known as:  PHOSLO Take 667 mg by mouth daily.   carvedilol 3.125 MG tablet Commonly known as:  COREG Take 1 tablet (3.125 mg total) by mouth 2 (two) times daily with a meal.   ceFAZolin 1-4 GM/50ML-% Soln Commonly known as:  ANCEF Inject 50 mLs (1 g total) into the vein every dialysis for up to 28 days. Monday , Wednesday and Friday with dialysis   cetirizine 10 MG tablet Commonly known as:  ZYRTEC Take 10 mg by mouth daily.   COMBIVENT RESPIMAT 20-100 MCG/ACT Aers respimat Generic drug:  Ipratropium-Albuterol Inhale 1 puff into the lungs every 6 (six) hours as needed for wheezing or shortness of breath.   docusate sodium 100 MG capsule Commonly known as:  COLACE Take 100 mg by mouth daily as needed for mild constipation or moderate constipation.   feeding supplement (NEPRO CARB STEADY) Liqd Take 237 mLs by mouth 2 (two) times daily between meals.   fluticasone 50 MCG/ACT nasal spray Commonly known as:  FLONASE Place 1 spray daily into the nose.   isosorbide mononitrate 30 MG 24 hr tablet Commonly known as:  IMDUR Take 30 mg by mouth daily.   midodrine 10 MG tablet Commonly known as:  PROAMATINE Take 5 mg by mouth 2 (two) times daily.   multivitamin with minerals Tabs tablet Take 1 tablet by mouth 2 (two) times daily.   nitroGLYCERIN 0.4 MG SL tablet Commonly known as:  NITROSTAT Place 0.4 mg under  the tongue every 5 (five) minutes as needed for chest pain.   pantoprazole 20 MG tablet Commonly known as:  PROTONIX Take 20 mg by mouth daily.   patiromer 8.4 g packet Commonly known as:  VELTASSA Take 1 packet (8.4 g total) by mouth daily. Start taking on:  07/26/2017   polyethylene glycol packet Commonly known as:  MIRALAX / GLYCOLAX Take 17 g by mouth daily as needed for mild constipation.   predniSONE 50 MG tablet Commonly known as:  DELTASONE Take 1 tablet daily for 3 days then stop Start taking on:  07/26/2017   pregabalin 75 MG capsule Commonly known as:  LYRICA Take 1 capsule (75 mg total) by mouth daily.   ROBAFEN 100 MG/5ML syrup Generic drug:  guaifenesin Take 200 mg by mouth 3 (three) times daily as needed for cough.   sevelamer carbonate 800 MG tablet Commonly  known as:  RENVELA Take 800 mg by mouth 3 (three) times daily with meals.   tiotropium 18 MCG inhalation capsule Commonly known as:  SPIRIVA Place 18 mcg into inhaler and inhale daily.         Today   CHIEF COMPLAINT:  No sob or CP   VITAL SIGNS:  Blood pressure (!) 156/82, pulse 64, temperature 97.7 F (36.5 C), temperature source Oral, resp. rate 16, height 6\' 2"  (1.88 m), weight 88.8 kg (195 lb 12.8 oz), SpO2 100 %.   REVIEW OF SYSTEMS:  Review of Systems  Constitutional: Negative.  Negative for chills, fever and malaise/fatigue.  HENT: Negative.  Negative for ear discharge, ear pain, hearing loss, nosebleeds and sore throat.   Eyes: Negative.  Negative for blurred vision and pain.  Respiratory: Negative.  Negative for cough, hemoptysis, shortness of breath and wheezing.   Cardiovascular: Negative.  Negative for chest pain, palpitations and leg swelling.  Gastrointestinal: Negative.  Negative for abdominal pain, blood in stool, diarrhea, nausea and vomiting.  Genitourinary: Negative.  Negative for dysuria.  Musculoskeletal: Negative.  Negative for back pain.  Skin: Negative.    Neurological: Negative for dizziness, tremors, speech change, focal weakness, seizures and headaches.  Endo/Heme/Allergies: Negative.  Does not bruise/bleed easily.  Psychiatric/Behavioral: Negative.  Negative for depression, hallucinations and suicidal ideas.     PHYSICAL EXAMINATION:  GENERAL:  62 y.o.-year-old patient lying in the bed with no acute distress.  NECK:  Supple, no jugular venous distention. No thyroid enlargement, no tenderness.  LUNGS: Normal breath sounds bilaterally, no wheezing, rales,rhonchi  No use of accessory muscles of respiration.  CARDIOVASCULAR: S1, S2 normal. No murmurs, rubs, or gallops.  ABDOMEN: Soft, non-tender, non-distended. Bowel sounds present. No organomegaly or mass.  EXTREMITIES: No pedal edema, cyanosis, or clubbing. Left AKA PSYCHIATRIC: The patient is alert and oriented x 3.  SKIN: No obvious rash, lesion, or ulcer.   DATA REVIEW:   CBC Recent Labs  Lab 07/22/17 0336  WBC 11.3*  HGB 12.7*  HCT 39.3*  PLT 139*    Chemistries  Recent Labs  Lab 07/21/17 0659  07/25/17 0416  NA 132*   < > 133*  K 7.0*   < > 6.2*  CL 96*   < > 95*  CO2 22   < > 21*  GLUCOSE 91   < > 110*  BUN 86*   < > 123*  CREATININE 12.64*   < > 13.68*  CALCIUM 9.6   < > 8.6*  AST 31  --   --   ALT 30  --   --   ALKPHOS 140*  --   --   BILITOT 1.0  --   --    < > = values in this interval not displayed.    Cardiac Enzymes Recent Labs  Lab 07/21/17 0659  TROPONINI 0.19*    Microbiology Results  @MICRORSLT48 @  RADIOLOGY:  No results found.    Allergies as of 07/25/2017      Reactions   Dust Mite Extract Other (See Comments)   Reaction: unknown      Medication List    TAKE these medications   acetaminophen 500 MG tablet Commonly known as:  TYLENOL Take 500 mg by mouth every 6 (six) hours as needed for mild pain.   albuterol 108 (90 Base) MCG/ACT inhaler Commonly known as:  PROVENTIL HFA;VENTOLIN HFA Inhale 1-2 puffs every 6 (six) hours  as needed into the lungs for wheezing or shortness  of breath.   apixaban 5 MG Tabs tablet Commonly known as:  ELIQUIS Take 1 tablet (5 mg total) by mouth 2 (two) times daily.   aspirin EC 325 MG tablet Take 325 mg by mouth daily.   atorvastatin 10 MG tablet Commonly known as:  LIPITOR Take 10 mg by mouth at bedtime.   budesonide-formoterol 160-4.5 MCG/ACT inhaler Commonly known as:  SYMBICORT Inhale 2 puffs into the lungs 2 (two) times daily. Reported on 08/14/2015   calcium acetate 667 MG capsule Commonly known as:  PHOSLO Take 667 mg by mouth daily.   carvedilol 3.125 MG tablet Commonly known as:  COREG Take 1 tablet (3.125 mg total) by mouth 2 (two) times daily with a meal.   ceFAZolin 1-4 GM/50ML-% Soln Commonly known as:  ANCEF Inject 50 mLs (1 g total) into the vein every dialysis for up to 28 days. Monday , Wednesday and Friday with dialysis   cetirizine 10 MG tablet Commonly known as:  ZYRTEC Take 10 mg by mouth daily.   COMBIVENT RESPIMAT 20-100 MCG/ACT Aers respimat Generic drug:  Ipratropium-Albuterol Inhale 1 puff into the lungs every 6 (six) hours as needed for wheezing or shortness of breath.   docusate sodium 100 MG capsule Commonly known as:  COLACE Take 100 mg by mouth daily as needed for mild constipation or moderate constipation.   feeding supplement (NEPRO CARB STEADY) Liqd Take 237 mLs by mouth 2 (two) times daily between meals.   fluticasone 50 MCG/ACT nasal spray Commonly known as:  FLONASE Place 1 spray daily into the nose.   isosorbide mononitrate 30 MG 24 hr tablet Commonly known as:  IMDUR Take 30 mg by mouth daily.   midodrine 10 MG tablet Commonly known as:  PROAMATINE Take 5 mg by mouth 2 (two) times daily.   multivitamin with minerals Tabs tablet Take 1 tablet by mouth 2 (two) times daily.   nitroGLYCERIN 0.4 MG SL tablet Commonly known as:  NITROSTAT Place 0.4 mg under the tongue every 5 (five) minutes as needed for chest  pain.   pantoprazole 20 MG tablet Commonly known as:  PROTONIX Take 20 mg by mouth daily.   patiromer 8.4 g packet Commonly known as:  VELTASSA Take 1 packet (8.4 g total) by mouth daily. Start taking on:  07/26/2017   polyethylene glycol packet Commonly known as:  MIRALAX / GLYCOLAX Take 17 g by mouth daily as needed for mild constipation.   predniSONE 50 MG tablet Commonly known as:  DELTASONE Take 1 tablet daily for 3 days then stop Start taking on:  07/26/2017   pregabalin 75 MG capsule Commonly known as:  LYRICA Take 1 capsule (75 mg total) by mouth daily.   ROBAFEN 100 MG/5ML syrup Generic drug:  guaifenesin Take 200 mg by mouth 3 (three) times daily as needed for cough.   sevelamer carbonate 800 MG tablet Commonly known as:  RENVELA Take 800 mg by mouth 3 (three) times daily with meals.   tiotropium 18 MCG inhalation capsule Commonly known as:  SPIRIVA Place 18 mcg into inhaler and inhale daily.         Management plans discussed with the patient and he is in agreement. Stable for discharge ALF  Patient should follow up with pcp  CODE STATUS:     Code Status Orders  (From admission, onward)        Start     Ordered   07/21/17 1611  Full code  Continuous  07/21/17 1610    Code Status History    Date Active Date Inactive Code Status Order ID Comments User Context   06/22/2017 21:45 06/23/2017 18:05 Full Code [id number]  Henreitta Leber, MD Inpatient   04/07/2017 09:37 04/10/2017 18:28 Full Code [id number]  Eloise Levels, MD ED   01/19/2017 01:45 01/21/2017 19:41 Full Code [id number]  Lance Coon, MD Inpatient   11/11/2016 04:21 11/14/2016 19:08 Full Code [id number]  Harrie Foreman, MD Inpatient   10/28/2016 07:56 10/30/2016 19:30 Full Code [id number]  Demetrios Loll, MD ED   08/21/2016 04:08 08/22/2016 18:26 DNR [id number]  Harrie Foreman, MD Inpatient   08/03/2016 23:19 08/07/2016 19:28 DNR [id number]  Lance Coon, MD Inpatient   07/26/2016 17:07 07/31/2016  21:58 DNR [id number]  Vaughan Basta, MD Inpatient   05/22/2016 00:34 05/24/2016 19:11 Full Code [id number]  Harvie Bridge, DO Inpatient   01/13/2015 01:44 01/14/2015 14:46 Full Code [id number]  Juluis Mire, MD ED   12/15/2014 09:32 12/15/2014 14:31 Full Code [id number]  Algernon Huxley, MD Inpatient   05/06/2014 20:29 05/10/2014 17:23 Full Code [id number]  Elam Dutch, MD Inpatient   05/04/2014 22:24 05/06/2014 20:29 Full Code [id number]  Rise Patience, MD Inpatient    Advance Directive Documentation     Most Recent Value  Type of Advance Directive  Healthcare Power of Attorney  Pre-existing out of facility DNR order (yellow form or pink MOST form)  No data  "MOST" Form in Place?  No data      TOTAL TIME TAKING CARE OF THIS PATIENT: 38 minutes.    Note: This dictation was prepared with Dragon dictation along with smaller phrase technology. Any transcriptional errors that result from this process are unintentional.  Adrien Dietzman M.D on 07/25/2017 at 11:41 AM  Between 7am to 6pm - Pager - [phone] After 6pm go to [email or web address] - password [email or web address] [facility]  Office  [phone]  CC: Primary care physician; Donnie Coffin, MD

## 2017-07-25 NOTE — Progress Notes (Signed)
Pt returned from special procedures s/p rt femoral perm cath placement.  Site clean and dry.  Pt denies need.  Awaiting dialysis for today then dc to facility

## 2017-07-25 NOTE — Progress Notes (Addendum)
Infectious Disease Long Term IV Antibiotic Orders [name] 08/21/1955  Diagnosis:  MSSA bacteremia, ESRD, HD cath infection   Culture results 07/21/17 Staphylococcus aureus (ZZ00)   Antibiotic Interpretation Microscan Method Status  CIPROFLOXACIN Sensitive <=0.5 SENSITIVE MIC Final  ERYTHROMYCIN Sensitive <=0.25 SENSITIVE MIC Final  GENTAMICIN Sensitive <=0.5 SENSITIVE MIC Final  OXACILLIN Sensitive 0.5 SENSITIVE MIC Final  TETRACYCLINE Sensitive <=1 SENSITIVE MIC Final  VANCOMYCIN Sensitive <=0.5 SENSITIVE MIC Final  TRIMETH/SULFA Sensitive <=10 SENSITIVE MIC Final  CLINDAMYCIN Sensitive <=0.25 SENSITIVE MIC Final  RIFAMPIN Sensitive <=0.5 SENSITIVE MIC Final  Inducible Clindamycin Sensitive NEGATIVE MIC Final  Susceptibility Comments   STAPHYLOCOCCUS AUREUS  TTE Study Conclusions  - Left ventricle: The cavity size was moderately dilated. Systolic   function was severely reduced. The estimated ejection fraction   was in the range of 20% to 25%. Akinesis of the anterior   myocardium. Akinesis of the anteroseptal myocardium. - Mitral valve: There was mild regurgitation. - Left atrium: The atrium was mildly dilated. - Right atrium: The atrium was mildly dilated. LABS Lab Results  Component Value Date   CREATININE 13.68 (H) 07/25/2017   Lab Results  Component Value Date   WBC 11.3 (H) 07/22/2017   HGB 12.7 (L) 07/22/2017   HCT 39.3 (L) 07/22/2017   MCV 87.8 07/22/2017   PLT 139 (L) 07/22/2017   Lab Results  Component Value Date   ESRSEDRATE 12 10/21/2015   No results found for: CRP  Allergies:  Allergies  Allergen Reactions  . Dust Mite Extract Other (See Comments)    Reaction: unknown    Discharge antibiotics Cefazolin    2 gm after every HD session.  Weekly CBC  Planned duration of antibiotics 4 weeks from  Stop date 3/38/18 Follow up clinic date no need for ID fu, fu with nephrology   Leonel Ramsay, MD

## 2017-07-25 NOTE — Progress Notes (Signed)
Post HD assessment  

## 2017-07-25 NOTE — Progress Notes (Signed)
Patient aware of discharge instructions, returning to [facility] as prior to admission. EMS present report given, VSS, oxygen in use, packet sent with pt.

## 2017-07-25 NOTE — Progress Notes (Signed)
HD tx start 

## 2017-07-25 NOTE — Progress Notes (Signed)
HD tx end  

## 2017-07-25 NOTE — Plan of Care (Signed)
  Progressing Pain Managment: General experience of comfort will improve 07/25/2017 0907 - Progressing by Etheleen Nicks, RN Note PRN medications   Not Progressing Clinical Measurements: Will remain free from infection 07/25/2017 0907 - Not Progressing by Etheleen Nicks, RN Note Receiving IV antibiotics, remains afebrile Diagnostic test results will improve 07/25/2017 0907 - Not Progressing by Etheleen Nicks, RN Note BUN 123/13.68 K 6.2 Fluid Volume: Compliance with measures to maintain balanced fluid volume will improve 07/25/2017 0907 - Not Progressing by Etheleen Nicks, RN Note For Permcath placement today

## 2017-07-25 NOTE — Progress Notes (Signed)
Pt to specials via bed

## 2017-07-25 NOTE — Care Management Important Message (Signed)
Important Message  Patient Details  Name: [name] MRN: [id number] Date of Birth: 20-Nov-1955   Medicare Important Message Given:  Yes    Beverly Sessions, RN 07/25/2017, 1:48 PM

## 2017-07-25 NOTE — Progress Notes (Signed)
Post HD assessment. Pt tolerated tx well without c/o or complications. New perm-cath would not run past BFR 300. MD made aware. Net UF 2027.

## 2017-07-25 NOTE — Progress Notes (Signed)
Notified Dr. Juleen China of patient potassium of 6.2. He recommend dialysis today after permcath placement.

## 2017-07-25 NOTE — Progress Notes (Signed)
LCSW was made aware by MD that patient is medically stable for discharge back to the [facility]. Call placed to facility who is aware patient needs new cath and then dialysis and then will transfer later this weekend. LCSW will have RN call report to ensure facility is aware and prepared as it will be late in the day.  LCSW updated FL2 and updated patient. Patient reports his sister is in the hospital getting a cath and that I could call brother to let him know he was being discharge. Patient uses O2 at the facility and will transport by EMS this evening.  No other needs at this time.  DC back to facility this evening.  Lane Hacker, MSW Clinical Social Work: [employer] Coverage for :  [phone]

## 2017-07-25 NOTE — Progress Notes (Signed)
Pre HD assessment  

## 2017-07-25 NOTE — H&P (Signed)
[facility] History & Physical Update  The patient was interviewed and re-examined.  The patient's previous History and Physical has been reviewed and is unchanged except that his potassium is now over 6.  That will preclude thrombectomy which I think would be likely as I believe his fistula is occluded in the upper arm.  He also does not appear to have a usable jugular vein today so we will place a PermCath in the femoral vein for immediate use.    Leotis Pain, MD  07/25/2017, 12:47 PM

## 2017-07-25 NOTE — Op Note (Signed)
OPERATIVE NOTE    PRE-OPERATIVE DIAGNOSIS: 1. ESRD 2.  Recent PermCath infection requiring removal of left jugular PermCath with previous multiple jugular PermCath in the past  POST-OPERATIVE DIAGNOSIS: same as above  PROCEDURE: 1. Ultrasound guidance for vascular access to the right femoral  vein 2. Fluoroscopic guidance for placement of catheter 3. Placement of a 44 cm tip to cuff tunneled hemodialysis catheter via the right femoral vein  SURGEON: Leotis Pain, MD  ANESTHESIA:  Local with moderate conscious sedation for approximately 20 minutes using 2 mg of Versed and 50 Mcg of Fentanyl  ESTIMATED BLOOD LOSS: 10 cc  FINDING(S): 1.  Patent right femoral vein.  Right jugular vein appeared occluded  SPECIMEN(S):  None  INDICATIONS:   Patient is a 62 y.o.male who presents with renal failure and a recent PermCath infection requiring removal of his jugular PermCath.   The patient needs long term dialysis access for their ESRD, and a Permcath is necessary.  Risks and benefits are discussed and informed consent is obtained.    DESCRIPTION: After obtaining full informed written consent, the patient was brought back to the vascular suited. Moderate conscious sedation was administered throughout the procedure with a face-to-face encounter with my presence for the entire procedure and with my supervision of the RN monitoring the patient's vital signs, pulse oximetry, telemetry, and mental status throughout the procedure.  His recently infected PermCath was in the left jugular and an ultrasound was done at the bedside which appeared to show occlusion of the right jugular at the clavicle.  Given this and the fact that his potassium was high and he needed dialysis today, a femoral PermCath was planned.  The patient's right groin was sterilely prepped and draped and a sterile surgical field was created.  The right femoral vein was visualized with ultrasound and found to be patent. It was then accessed  under direct ultrasound guidance and a permanent image was recorded. A wire was placed. After skin nick and dilatation, the peel-away sheath was placed over the wire. I then turned my attention to an area about 5 cm inferior and lateral to the access incision and a small counterincision was created.  I tunneled from the counter  incision to the access site. Using fluoroscopic guidance, a 53 centimeterer tip to cuff tunneled hemodialysis catheter was selected, and tunneled from the counter  incision to the access site. It was then placed through the peel-away sheath and the peel-away sheath was removed. Using fluoroscopic guidance the catheter tips were parked in the retrohepatic vena cava just below the right atrium. The appropriate distal connectors were placed. It withdrew blood well and flushed easily with heparinized saline and a concentrated heparin solution was then placed. It was secured to the leg  with 2 Prolene sutures. The access incision was closed single 4-0 Monocryl. A 4-0 Monocryl pursestring suture was placed around the exit site. Sterile dressings were placed. The patient tolerated the procedure well and was taken to the recovery room in stable condition.  COMPLICATIONS: None  CONDITION: Stable    Leotis Pain 07/25/2017 1:11 PM   This note was created with Dragon Medical transcription system. Any errors in dictation are purely unintentional.

## 2017-07-25 NOTE — Progress Notes (Signed)
[facility]  ROUNDING NOTE   Subjective:   K 6.2 - kayexalate given  Permcath and hemodialysis schedule for later today.   Objective:  Vital signs in last 24 hours:  Temp:  [97.4 F (36.3 C)-98.1 F (36.7 C)] 97.7 F (36.5 C) (03/01 0824) Pulse Rate:  [58-64] 64 (03/01 0824) Resp:  [14-16] 16 (03/01 0824) BP: (108-156)/(74-89) 156/82 (03/01 0824) SpO2:  [99 %-100 %] 100 % (03/01 0824) Weight:  [88.8 kg (195 lb 12.8 oz)] 88.8 kg (195 lb 12.8 oz) (03/01 0557)  Weight change: 1.089 kg (2 lb 6.4 oz) Filed Weights   07/23/17 0417 07/24/17 0442 07/25/17 0557  Weight: 86.2 kg (190 lb) 87.7 kg (193 lb 6.4 oz) 88.8 kg (195 lb 12.8 oz)    Intake/Output: I/O last 3 completed shifts: In: 30 [P.O.:760; IV Piggyback:50] Out: 0    Intake/Output this shift:  Total I/O In: 3 [I.V.:3] Out: -   Physical Exam: General: NAD  Head: Normocephalic, atraumatic. Moist oral mucosal membranes  Eyes: Anicteric, PERRL  Neck: Supple, trachea midline  Lungs:  Clear to auscultation  Heart: Regular rate and rhythm  Abdomen:  Soft, nontender,   Extremities: Left BKA, no edema  Neurologic: Nonfocal, moving all four extremities  Skin: No lesions  Access:  left AVF maturing    Basic Metabolic Panel: Recent Labs  Lab 07/22/17 0336 07/23/17 0649 07/23/17 0820 07/24/17 0036 07/25/17 0416  NA 132* 133* 132* 133* 133*  K 6.1* 6.3* 6.1* 4.9 6.2*  CL 94* 95* 96* 94* 95*  CO2 23 22 22 23  21*  GLUCOSE 135* 145* 119* 110* 110*  BUN 70* 86* 84* 99* 123*  CREATININE 10.32* 10.68* 10.59* 12.34* 13.68*  CALCIUM 8.7* 8.8* 8.6* 8.0* 8.6*  PHOS  --  5.9*  --   --   --     Liver Function Tests: Recent Labs  Lab 07/21/17 0659 07/23/17 0649  AST 31  --   ALT 30  --   ALKPHOS 140*  --   BILITOT 1.0  --   PROT 9.7*  --   ALBUMIN 3.6 2.9*   No results for input(s): LIPASE, AMYLASE in the last 168 hours. No results for input(s): AMMONIA in the last 168 hours.  CBC: Recent Labs   Lab 07/21/17 0613 07/22/17 0336  WBC 9.7 11.3*  NEUTROABS 6.0  --   HGB 13.5 12.7*  HCT 41.9 39.3*  MCV 87.8 87.8  PLT 175 139*    Cardiac Enzymes: Recent Labs  Lab 07/21/17 0659  TROPONINI 0.19*    BNP: Invalid input(s): POCBNP  CBG: No results for input(s): GLUCAP in the last 168 hours.  Microbiology: Results for orders placed or performed during the hospital encounter of 07/21/17  Blood culture (routine x 2)     Status: Abnormal   Collection Time: 07/21/17  9:13 AM  Result Value Ref Range Status   Specimen Description   Final    BLOOD RIGHT ARM Performed at [facility], [address], [place], [place] [address]    Special Requests   Final    BOTTLES DRAWN AEROBIC AND ANAEROBIC Blood Culture adequate volume Performed at [facility], [address], [place],  [address]    Culture  Setup Time   Final    GRAM POSITIVE COCCI IN BOTH AEROBIC AND ANAEROBIC BOTTLES CRITICAL RESULT CALLED TO, READ BACK BY AND VERIFIED WITH: DAVID BESANTI ON 07/22/17 AT [place] WITH RESULT Performed at [facility]  [facility], [place] [address], [place], [place] [address]    Culture STAPHYLOCOCCUS AUREUS (A)  Final   Report Status 07/24/2017 FINAL  Final   Organism ID, Bacteria STAPHYLOCOCCUS AUREUS  Final      Susceptibility   Staphylococcus aureus - MIC*    CIPROFLOXACIN <=0.5 SENSITIVE Sensitive     ERYTHROMYCIN <=0.25 SENSITIVE Sensitive     GENTAMICIN <=0.5 SENSITIVE Sensitive     OXACILLIN 0.5 SENSITIVE Sensitive     TETRACYCLINE <=1 SENSITIVE Sensitive     VANCOMYCIN <=0.5 SENSITIVE Sensitive     TRIMETH/SULFA <=10 SENSITIVE Sensitive     CLINDAMYCIN <=0.25 SENSITIVE Sensitive     RIFAMPIN <=0.5 SENSITIVE Sensitive     Inducible Clindamycin NEGATIVE Sensitive     * STAPHYLOCOCCUS AUREUS  Blood culture (routine x 2)     Status: Abnormal   Collection Time: 07/21/17  9:13 AM  Result Value Ref Range Status   Specimen  Description   Final    BLOOD RIGHT [facility]  Performed at [facility], [address] [place], [place] [address]    Special Requests   Final    BOTTLES DRAWN AEROBIC AND ANAEROBIC Blood Culture adequate volume Performed at [facility], [address] [place], [place] [address]    Culture  Setup Time   Final    GRAM POSITIVE COCCI IN BOTH AEROBIC AND ANAEROBIC BOTTLES CRITICAL VALUE NOTED.  VALUE IS CONSISTENT WITH PREVIOUSLY REPORTED AND CALLED VALUE. JAG GRAM STAIN REVIEWED-AGREE WITH RESULT    Culture (A)  Final    STAPHYLOCOCCUS AUREUS SUSCEPTIBILITIES PERFORMED ON PREVIOUS CULTURE WITHIN THE LAST 5 DAYS. Performed at [facility], [place] [address], [place], [place] [address]    Report Status 07/24/2017 FINAL  Final  Blood Culture ID Panel (Reflexed)     Status: Abnormal   Collection Time: 07/21/17  9:13 AM  Result Value Ref Range Status   Enterococcus species NOT DETECTED NOT DETECTED Final   Listeria monocytogenes NOT DETECTED NOT DETECTED Final   Staphylococcus species DETECTED (A) NOT DETECTED Final    Comment: CRITICAL RESULT CALLED TO, READ BACK BY AND VERIFIED WITH: DAVID BESANTI ON 07/22/17 AT 0012 BY JAG    Staphylococcus aureus DETECTED (A) NOT DETECTED Final    Comment: Methicillin (oxacillin) susceptible Staphylococcus aureus (MSSA). Preferred therapy is anti staphylococcal beta lactam antibiotic (Cefazolin or Nafcillin), unless clinically contraindicated. CRITICAL RESULT CALLED TO, READ BACK BY AND VERIFIED WITH: DAVID BESANTI ON 07/22/17 AT 0012 BY JAG    Methicillin resistance NOT DETECTED NOT DETECTED Final   Streptococcus species NOT DETECTED NOT DETECTED Final   Streptococcus agalactiae NOT DETECTED NOT DETECTED Final   Streptococcus pneumoniae NOT DETECTED NOT DETECTED Final   Streptococcus pyogenes NOT DETECTED NOT DETECTED Final   Acinetobacter baumannii NOT DETECTED NOT DETECTED Final   Enterobacteriaceae species NOT DETECTED NOT  DETECTED Final   Enterobacter cloacae complex NOT DETECTED NOT DETECTED Final   Escherichia coli NOT DETECTED NOT DETECTED Final   Klebsiella oxytoca NOT DETECTED NOT DETECTED Final   Klebsiella pneumoniae NOT DETECTED NOT DETECTED Final   Proteus species NOT DETECTED NOT DETECTED Final   Serratia marcescens NOT DETECTED NOT DETECTED Final   Haemophilus influenzae NOT DETECTED NOT DETECTED Final   Neisseria meningitidis NOT DETECTED NOT DETECTED Final   Pseudomonas aeruginosa NOT DETECTED NOT DETECTED Final   Candida albicans NOT DETECTED NOT DETECTED Final   Candida glabrata NOT DETECTED NOT DETECTED Final   Candida krusei  NOT DETECTED NOT DETECTED Final   Candida parapsilosis NOT DETECTED NOT DETECTED Final   Candida tropicalis NOT DETECTED NOT DETECTED Final    Comment: Performed at [facility], [address] [place], [place] [address]  Culture, blood (Routine X 2) w Reflex to ID Panel     Status: None (Preliminary result)   Collection Time: 07/22/17  8:01 AM  Result Value Ref Range Status   Specimen Description BLOOD BLOOD RIGHT HAND  Final   Special Requests   Final    BOTTLES DRAWN AEROBIC AND ANAEROBIC Blood Culture adequate volume   Culture   Final    NO GROWTH 3 DAYS Performed at [facility], [address] [place], [place] [address]    Report Status PENDING  Incomplete  Culture, blood (Routine X 2) w Reflex to ID Panel     Status: None (Preliminary result)   Collection Time: 07/22/17  8:01 AM  Result Value Ref Range Status   Specimen Description BLOOD RIGHT ANTECUBITAL  Final   Special Requests   Final    BOTTLES DRAWN AEROBIC AND ANAEROBIC Blood Culture adequate volume   Culture   Final    NO GROWTH 3 DAYS Performed at [facility], [address], [place], [place] [address]    Report Status PENDING  Incomplete  Culture, blood (Routine X 2) w Reflex to ID Panel     Status: None (Preliminary result)   Collection Time: 07/24/17 12:35  AM  Result Value Ref Range Status   Specimen Description BLOOD RT FOA  Final   Special Requests   Final    BOTTLES DRAWN AEROBIC AND ANAEROBIC Blood Culture adequate volume   Culture   Final    NO GROWTH 1 DAY Performed at [facility], [address], [place], [place] [address]    Report Status PENDING  Incomplete  Culture, blood (Routine X 2) w Reflex to ID Panel     Status: None (Preliminary result)   Collection Time: 07/24/17 12:48 AM  Result Value Ref Range Status   Specimen Description BLOOD RIGHT HAND  Final   Special Requests   Final    BOTTLES DRAWN AEROBIC AND ANAEROBIC Blood Culture adequate volume   Culture   Final    NO GROWTH 1 DAY Performed at [facility], [address], [place], [place] [address]    Report Status PENDING  Incomplete  MRSA PCR Screening     Status: Abnormal   Collection Time: 07/24/17  7:44 PM  Result Value Ref Range Status   MRSA by PCR POSITIVE (A) NEGATIVE Final    Comment:        The GeneXpert MRSA Assay (FDA approved for NASAL specimens only), is one component of a comprehensive MRSA colonization surveillance program. It is not intended to diagnose MRSA infection nor to guide or monitor treatment for MRSA infections. RESULT CALLED TO, READ BACK BY AND VERIFIED WITH: LEDIA BOATENG ON 07/24/17 AT 2143 BY JAG Performed at [facility], [address] [place], [place] [address]     Coagulation Studies: No results for input(s): LABPROT, INR in the last 72 hours.  Urinalysis: No results for input(s): COLORURINE, LABSPEC, PHURINE, GLUCOSEU, HGBUR, BILIRUBINUR, KETONESUR, PROTEINUR, UROBILINOGEN, NITRITE, LEUKOCYTESUR in the last 72 hours.  Invalid input(s): APPERANCEUR    Imaging: No results found.   Medications:   . sodium chloride    . sodium chloride    .  ceFAZolin (ANCEF) IV Stopped (07/24/17 1852)   .  apixaban  5 mg Oral BID  . aspirin EC  325 mg Oral Daily  . atorvastatin  10 mg Oral  QHS  . calcium acetate  667 mg Oral Q breakfast  . carvedilol  3.125 mg Oral BID WC  . dextrose  25 mL Intravenous Once  . feeding supplement (NEPRO CARB STEADY)  237 mL Oral BID BM  . insulin aspart  10 Units Intravenous Once  . isosorbide mononitrate  30 mg Oral Daily  . loratadine  10 mg Oral Daily  . mouth rinse  15 mL Mouth Rinse BID  . midodrine  5 mg Oral BID  . mometasone-formoterol  2 puff Inhalation BID  . multivitamin  1 tablet Oral QHS  . [START ON 07/28/2017] multivitamin with minerals  1 tablet Oral Once per day on Mon Wed Fri  . pantoprazole  40 mg Oral Daily  . patiromer  8.4 g Oral Daily  . predniSONE  50 mg Oral Q breakfast  . pregabalin  75 mg Oral Daily  . sevelamer carbonate  800 mg Oral TID WC  . sodium chloride flush  3 mL Intravenous Q12H  . sodium polystyrene  30 g Oral Once  . tiotropium  18 mcg Inhalation Daily     Assessment/ Plan:  [name] is a 62 y.o. black male with ESRD on hemodialysis, hypertension, hyperlipidemia, anemia, COPD, peripheral vascular disease, left BKA, right toe amputations  MWF [facility].   1. End stage renal disease with hyperkalemia. No dialysis access currently. AVF does not seem viable for use at this time.  Catheter related infection - New catheter to be placed today.  - MWF schedule.  - Appreciate vascular and ID input.  - kayexalate as needed. Veltassa ordered.   2. Anemia of chronic kidney disease:   - EPO as outpatient.   3. Hypotension - midodrine before dialysis.   4. Secondary Hyperparathyroidism: - calcium acetate and sevelamer with meals.    LOS: 4 Nico Syme 3/1/201911:44 AM

## 2017-07-25 NOTE — Progress Notes (Signed)
Notified Dr. Jerelyn Charles of patient potassium of 6.2  New orders put in at this time.

## 2017-07-25 NOTE — NC FL2 (Addendum)
[place] LEVEL OF CARE SCREENING TOOL     IDENTIFICATION  Patient Name: [name] Birthdate: 09-16-1955 Sex: male Admission Date (Current Location): 07/21/2017  [place] and [place] Number:  Engineering geologist and Address:  [facility], [address] [place] [address], [place], [place] [address]      Provider Number: [id number]  Attending Physician Name and Address:  Bettey Costa, MD  Relative Name and Phone Number:       Current Level of Care: Hospital Recommended Level of Care: [place] Prior Approval Number:    Date Approved/Denied:   PASRR Number:    Discharge Plan: Other (Comment)(Return to the [facility])    Current Diagnoses: Patient Active Problem List   Diagnosis Date Noted  . Apnea   . End-stage renal disease on hemodialysis ([place])   . Hypervolemia   . Respiratory distress   . Altered mental status   . Acute on chronic systolic CHF (congestive heart failure) ([place]) 01/18/2017  . Respiratory failure with hypoxia ([place]) 11/11/2016  . Acute respiratory failure ([place]) 10/28/2016  . Pulmonary embolism ([place]) 08/03/2016  . NSTEMI (non-ST elevated myocardial infarction) ([place]) 07/26/2016  . Acute respiratory failure with hypoxia ([place]) 05/24/2016  . Right lower lobe pneumonia ([place]) 05/24/2016  . Elevated troponin 05/24/2016  . Pulmonary edema 05/21/2016  . Kidney dialysis as the cause of abnormal reaction of the patient, or of later complication, without mention of misadventure at the time of the procedure (CODE) 03/05/2016  . PVD (peripheral vascular disease) ([place]) 03/05/2016  . Preop examination 05/02/2015  . Chronic systolic heart failure ([place])   . Sepsis ([place]) 01/13/2015  . Acute encephalopathy 01/13/2015  . Hyperkalemia 01/13/2015  . Gangrene of foot ([place]) 05/04/2014  . ESRD (end stage renal disease) on dialysis ([place]) 05/04/2014  . CAD (coronary artery disease) 05/04/2014  . Normocytic anemia 05/04/2014  . Hyperlipidemia  04/26/2010  . HYPERTENSION, BENIGN 04/26/2010  . CAD, NATIVE VESSEL 04/26/2010    Orientation RESPIRATION BLADDER Height & Weight     Self, Time, Situation, Place  O2(2L) Continent Weight: 195 lb 12.8 oz (88.8 kg) Height:  6\' 2"  (188 cm)  BEHAVIORAL SYMPTOMS/MOOD NEUROLOGICAL BOWEL NUTRITION STATUS  (none) (none) Continent Diet(renal diet)  AMBULATORY STATUS COMMUNICATION OF NEEDS Skin   Supervision Verbally Normal                       Personal Care Assistance Level of Assistance  Dressing, Bathing, Feeding Bathing Assistance: Independent Feeding assistance: Independent Dressing Assistance: Independent     Functional Limitations Info  Sight, Hearing, Speech Sight Info: Adequate Hearing Info: Adequate Speech Info: Adequate    SPECIAL CARE FACTORS FREQUENCY  PT (By licensed PT)(outpatient hemodialysis)    3x a week                Contractures Contractures Info: Not present    Additional Factors Info  Code Status, Isolation Precautions Code Status Info: Full Code       Isolation Precautions Info: Contact  MRSA     Current Medications (07/25/2017):  This is the current hospital active medication list Current Facility-Administered Medications  Medication Dose Route Frequency Provider Last Rate Last Dose  . 0.9 %  sodium chloride infusion  250 mL Intravenous PRN Mody, Sital, MD      . 0.9 %  sodium chloride infusion   Intravenous Continuous [name], Erskine Squibb, MD      . acetaminophen (TYLENOL) tablet 500 mg  500 mg Oral Q6H PRN Mody, Sital, MD      . albuterol (PROVENTIL) (2.5 MG/3ML) 0.083% nebulizer solution 2.5 mg  2.5 mg Nebulization Q6H PRN Mody, Sital, MD      . apixaban (ELIQUIS) tablet 5 mg  5 mg Oral BID Bettey Costa, MD   5 mg at 07/24/17 2202  . aspirin EC tablet 325 mg  325 mg Oral Daily Bettey Costa, MD   325 mg at 07/24/17 1057  . atorvastatin (LIPITOR) tablet 10 mg  10 mg Oral QHS Bettey Costa, MD   10 mg at 07/24/17 2202  . bisacodyl (DULCOLAX) EC  tablet 5 mg  5 mg Oral Daily PRN Bettey Costa, MD      . calcium acetate (PHOSLO) capsule 667 mg  667 mg Oral Q breakfast Bettey Costa, MD   667 mg at 07/24/17 0847  . carvedilol (COREG) tablet 3.125 mg  3.125 mg Oral BID WC Bettey Costa, MD   3.125 mg at 07/24/17 2202  . ceFAZolin (ANCEF) IVPB 1 g/50 mL premix  1 g Intravenous Q24H Pernell Dupre, RPH   Stopped at 07/24/17 4801  . dextrose 50 % solution 25 mL  25 mL Intravenous Once Bettey Costa, MD   Stopped at 07/23/17 1155  . docusate sodium (COLACE) capsule 100 mg  100 mg Oral Daily PRN Mody, Sital, MD      . feeding supplement (NEPRO CARB STEADY) liquid 237 mL  237 mL Oral BID BM Mody, Sital, MD   237 mL at 07/24/17 1450  . guaifenesin (ROBITUSSIN) 100 MG/5ML syrup 200 mg  200 mg Oral TID PRN Bettey Costa, MD      . hydrALAZINE (APRESOLINE) injection 10 mg  10 mg Intravenous Q6H PRN Mody, Sital, MD      . insulin aspart (novoLOG) injection 10 Units  10 Units Intravenous Once Bettey Costa, MD   Stopped at 07/23/17 1156  . ipratropium-albuterol (DUONEB) 0.5-2.5 (3) MG/3ML nebulizer solution 3 mL  3 mL Nebulization Q6H PRN Mody, Sital, MD      . isosorbide mononitrate (IMDUR) 24 hr tablet 30 mg  30 mg Oral Daily Mody, Sital, MD   30 mg at 07/24/17 1103  . ketorolac (TORADOL) 30 MG/ML injection 30 mg  30 mg Intravenous Q6H PRN Mody, Sital, MD      . loratadine (CLARITIN) tablet 10 mg  10 mg Oral Daily Mody, Sital, MD   10 mg at 07/24/17 1057  . MEDLINE mouth rinse  15 mL Mouth Rinse BID Bettey Costa, MD   15 mL at 07/23/17 2042  . midodrine (PROAMATINE) tablet 5 mg  5 mg Oral BID Bettey Costa, MD   5 mg at 07/24/17 2203  . mometasone-formoterol (DULERA) 200-5 MCG/ACT inhaler 2 puff  2 puff Inhalation BID Bettey Costa, MD   2 puff at 07/25/17 0828  . multivitamin (RENA-VIT) tablet 1 tablet  1 tablet Oral QHS Bettey Costa, MD   1 tablet at 07/24/17 2203  . [START ON 07/28/2017] multivitamin with minerals tablet 1 tablet  1 tablet Oral Once per day on Mon  Wed Fri Mody, Ulice Bold, MD      . nitroGLYCERIN (NITROSTAT) SL tablet 0.4 mg  0.4 mg Sublingual Q5 min PRN Bettey Costa, MD      . ondansetron (ZOFRAN) tablet 4 mg  4 mg Oral Q6H PRN Mody, Sital, MD       Or  . ondansetron (ZOFRAN) injection 4 mg  4 mg Intravenous Q6H PRN Mody, Sital,  MD      . pantoprazole (PROTONIX) EC tablet 40 mg  40 mg Oral Daily Mody, Sital, MD   40 mg at 07/24/17 1057  . patiromer Daryll Drown) packet 8.4 g  8.4 g Oral Daily Kolluru, Sarath, MD   8.4 g at 07/24/17 1449  . polyethylene glycol (MIRALAX / GLYCOLAX) packet 17 g  17 g Oral Daily PRN Mody, Sital, MD      . predniSONE (DELTASONE) tablet 50 mg  50 mg Oral Q breakfast Benjie Karvonen, Sital, MD   50 mg at 07/24/17 0846  . pregabalin (LYRICA) capsule 75 mg  75 mg Oral Daily Bettey Costa, MD   75 mg at 07/24/17 1057  . sevelamer carbonate (RENVELA) tablet 800 mg  800 mg Oral TID WC Bettey Costa, MD   800 mg at 07/24/17 1817  . sodium chloride flush (NS) 0.9 % injection 3 mL  3 mL Intravenous Q12H Mody, Sital, MD   3 mL at 07/25/17 1000  . sodium chloride flush (NS) 0.9 % injection 3 mL  3 mL Intravenous PRN Mody, Sital, MD      . sodium polystyrene (KAYEXALATE) 15 GM/60ML suspension 30 g  30 g Oral Once Gorden Harms, MD   Stopped at 07/25/17 0809  . tiotropium (SPIRIVA) inhalation capsule 18 mcg  18 mcg Inhalation Daily Bettey Costa, MD   18 mcg at 07/25/17 0828  . traMADol (ULTRAM) tablet 50 mg  50 mg Oral Q6H PRN Bettey Costa, MD         Discharge Medications: TAKE these medications   acetaminophen 500 MG tablet Commonly known as:  TYLENOL Take 500 mg by mouth every 6 (six) hours as needed for mild pain.   albuterol 108 (90 Base) MCG/ACT inhaler Commonly known as:  PROVENTIL HFA;VENTOLIN HFA Inhale 1-2 puffs every 6 (six) hours as needed into the lungs for wheezing or shortness of breath.   apixaban 5 MG Tabs tablet Commonly known as:  ELIQUIS Take 1 tablet (5 mg total) by mouth 2 (two) times daily.   aspirin EC 325  MG tablet Take 325 mg by mouth daily.   atorvastatin 10 MG tablet Commonly known as:  LIPITOR Take 10 mg by mouth at bedtime.   budesonide-formoterol 160-4.5 MCG/ACT inhaler Commonly known as:  SYMBICORT Inhale 2 puffs into the lungs 2 (two) times daily. Reported on 08/14/2015   calcium acetate 667 MG capsule Commonly known as:  PHOSLO Take 667 mg by mouth daily.   carvedilol 3.125 MG tablet Commonly known as:  COREG Take 1 tablet (3.125 mg total) by mouth 2 (two) times daily with a meal.   ceFAZolin 1-4 GM/50ML-% Soln Commonly known as:  ANCEF Inject 50 mLs (1 g total) into the vein daily for 28 days.   cetirizine 10 MG tablet Commonly known as:  ZYRTEC Take 10 mg by mouth daily.   COMBIVENT RESPIMAT 20-100 MCG/ACT Aers respimat Generic drug:  Ipratropium-Albuterol Inhale 1 puff into the lungs every 6 (six) hours as needed for wheezing or shortness of breath.   docusate sodium 100 MG capsule Commonly known as:  COLACE Take 100 mg by mouth daily as needed for mild constipation or moderate constipation.   feeding supplement (NEPRO CARB STEADY) Liqd Take 237 mLs by mouth 2 (two) times daily between meals.   fluticasone 50 MCG/ACT nasal spray Commonly known as:  FLONASE Place 1 spray daily into the nose.   isosorbide mononitrate 30 MG 24 hr tablet Commonly known as:  IMDUR Take  30 mg by mouth daily.   midodrine 10 MG tablet Commonly known as:  PROAMATINE Take 5 mg by mouth 2 (two) times daily.   multivitamin with minerals Tabs tablet Take 1 tablet by mouth 2 (two) times daily.   nitroGLYCERIN 0.4 MG SL tablet Commonly known as:  NITROSTAT Place 0.4 mg under the tongue every 5 (five) minutes as needed for chest pain.   pantoprazole 20 MG tablet Commonly known as:  PROTONIX Take 20 mg by mouth daily.   patiromer 8.4 g packet Commonly known as:  VELTASSA Take 1 packet (8.4 g total) by mouth daily. Start taking on:  07/26/2017   polyethylene  glycol packet Commonly known as:  MIRALAX / GLYCOLAX Take 17 g by mouth daily as needed for mild constipation.   predniSONE 50 MG tablet Commonly known as:  DELTASONE Take 1 tablet daily for 3 days then stop Start taking on:  07/26/2017   pregabalin 75 MG capsule Commonly known as:  LYRICA Take 1 capsule (75 mg total) by mouth daily.   ROBAFEN 100 MG/5ML syrup Generic drug:  guaifenesin Take 200 mg by mouth 3 (three) times daily as needed for cough.   sevelamer carbonate 800 MG tablet Commonly known as:  RENVELA Take 800 mg by mouth 3 (three) times daily with meals.   tiotropium 18 MCG inhalation capsule Commonly known as:  SPIRIVA Place 18 mcg into inhaler and inhale daily.           Relevant Imaging Results:  Relevant Lab Results:   Additional Information SS#  [id number]  [place], [place]

## 2017-07-25 NOTE — Care Management (Signed)
Amanda Morris dialysis liaison notified of discharge  

## 2017-07-27 LAB — CULTURE, BLOOD (ROUTINE X 2)
Culture: NO GROWTH
Culture: NO GROWTH
SPECIAL REQUESTS: ADEQUATE
Special Requests: ADEQUATE

## 2017-07-28 ENCOUNTER — Encounter: Payer: Self-pay | Admitting: Vascular Surgery

## 2017-07-29 LAB — CULTURE, BLOOD (ROUTINE X 2)
CULTURE: NO GROWTH
Culture: NO GROWTH
SPECIAL REQUESTS: ADEQUATE
Special Requests: ADEQUATE

## 2017-07-31 ENCOUNTER — Ambulatory Visit: Payer: Medicare Other | Admitting: Family

## 2017-07-31 ENCOUNTER — Telehealth: Payer: Self-pay | Admitting: Family

## 2017-07-31 NOTE — Telephone Encounter (Signed)
Patient did not show for his [facility] appointment on 07/31/17. Will attempt to reschedule.   This is the 3rd appointment that he has missed.

## 2017-08-05 ENCOUNTER — Ambulatory Visit (INDEPENDENT_AMBULATORY_CARE_PROVIDER_SITE_OTHER): Payer: Medicare Other | Admitting: Vascular Surgery

## 2017-08-05 ENCOUNTER — Encounter (INDEPENDENT_AMBULATORY_CARE_PROVIDER_SITE_OTHER): Payer: Self-pay | Admitting: Vascular Surgery

## 2017-08-05 ENCOUNTER — Ambulatory Visit (INDEPENDENT_AMBULATORY_CARE_PROVIDER_SITE_OTHER): Payer: Medicare Other

## 2017-08-05 ENCOUNTER — Encounter (INDEPENDENT_AMBULATORY_CARE_PROVIDER_SITE_OTHER): Payer: Self-pay

## 2017-08-05 VITALS — BP 148/89 | HR 79 | Resp 16 | Wt 195.0 lb

## 2017-08-05 DIAGNOSIS — Z992 Dependence on renal dialysis: Secondary | ICD-10-CM | POA: Diagnosis not present

## 2017-08-05 DIAGNOSIS — N186 End stage renal disease: Secondary | ICD-10-CM

## 2017-08-05 DIAGNOSIS — I10 Essential (primary) hypertension: Secondary | ICD-10-CM

## 2017-08-05 DIAGNOSIS — E785 Hyperlipidemia, unspecified: Secondary | ICD-10-CM

## 2017-08-05 NOTE — Progress Notes (Signed)
Subjective:    Patient ID: [name], male    DOB: Dec 27, 1955, 62 y.o.   MRN: [id number] Chief Complaint  Patient presents with  . Follow-up   Patient presents at the request of his dialysis center for a poorly functioning left upper extremity dialysis access.  The patient is currently being maintained by a left IJ PermCath without issue.  The patient denies any left upper extremity pain or ulceration.  The patient underwent a left upper extremity HDA which was notable for a overall flow volume of 369.  There is a hemodynamically significant narrowing at the [facility] fossa (string sign).  There is competing branch noted in the middle upper level of the cephalic vein.  The patient denies any uremic symptoms.  The patient denies any fever, nausea or vomiting.  The patient dialyzes Monday Wednesday Friday   Review of Systems  Constitutional: Negative.   HENT: Negative.   Eyes: Negative.   Respiratory: Negative.   Cardiovascular: Negative.   Gastrointestinal: Negative.   Endocrine: Negative.   Genitourinary:       ESRD  Musculoskeletal: Negative.   Skin: Negative.   Allergic/Immunologic: Negative.   Neurological: Negative.   Hematological: Negative.   Psychiatric/Behavioral: Negative.       Objective:   Physical Exam  Constitutional: He is oriented to person, place, and time. He appears well-developed and well-nourished. No distress.  HENT:  Head: Normocephalic and atraumatic.  Eyes: Conjunctivae are normal. Pupils are equal, round, and reactive to light.  Neck: Normal range of motion.  Cardiovascular: Normal rate, regular rhythm, normal heart sounds and intact distal pulses.  Pulses:      Radial pulses are 2+ on the right side, and 2+ on the left side.  Left upper extremity dialysis access: Good bruit and thrill.  Skin is intact.  Pulmonary/Chest: Effort normal and breath sounds normal.  Musculoskeletal: Normal range of motion. He exhibits no edema.  Neurological: He is alert  and oriented to person, place, and time.  Skin: Skin is warm and dry. He is not diaphoretic.  Psychiatric: He has a normal mood and affect. His behavior is normal. Judgment and thought content normal.  Vitals reviewed.  BP (!) 148/89 (BP Location: Right Arm)   Pulse 79   Resp 16   Wt 195 lb (88.5 kg)   BMI 25.04 kg/m   Past Medical History:  Diagnosis Date  . 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 Amputee, below knee, left ([place])   . Anemia   . Asthma   . Cardiomyopathy ([place])   . CHF (congestive heart failure) ([place])   . Chronic systolic heart failure ([place])   . Complication of anesthesia    hypotension  . COPD (chronic obstructive pulmonary disease) ([place])   . Coronary artery disease   . Dialysis patient ([place])    Mon, Wed, Fri  . End stage renal disease ([place])   . GERD (gastroesophageal reflux disease)   . Headache   . History of kidney stones   . History of pulmonary embolism   . HLD (hyperlipidemia)   . HTN (hypertension)   . Hyperparathyroidism   . Myocardial infarction ([place])   . Peripheral vascular disease ([place])   . Shortness of breath dyspnea   . Sleep apnea    NO C-PAP, Patient stated in process of  "getting one"   . Tobacco dependence    Social History   Socioeconomic History  . Marital status: Widowed  Spouse name: Not on file  . Number of children: Not on file  . Years of education: Not on file  . Highest education level: Not on file  Social Needs  . Financial resource strain: Not on file  . Food insecurity - worry: Not on file  . Food insecurity - inability: Not on file  . Transportation needs - medical: Not on file  . Transportation needs - non-medical: Not on file  Occupational History  . Not on file  Tobacco Use  . Smoking status: Light Tobacco Smoker    Packs/day: 0.25    Years: 30.00    Pack years: 7.50    Types: Cigarettes  . Smokeless tobacco: Never Used  . Tobacco comment: 5  Substance and Sexual Activity  . Alcohol use:  No    Alcohol/week: 0.0 oz  . Drug use: No    Comment: has used crack cocaine in past   . Sexual activity: Not Currently    Birth control/protection: Abstinence  Other Topics Concern  . Not on file  Social History Narrative   Engaged.    Past Surgical History:  Procedure Laterality Date  . AMPUTATION Left 05/06/2014   Procedure: AMPUTATION BELOW KNEE;  Surgeon: Elam Dutch, MD;  Location: [facility];  Service: Vascular;  Laterality: Left;  . AMPUTATION Right 01/12/2015   Procedure: Foot transmetatarsal amputation;  Surgeon: Algernon Huxley, MD;  Location: [facility];  Service: Vascular;  Laterality: Right;  . APPLICATION OF WOUND VAC Right 03/01/2015   Procedure: Application of Bio-connekt graft and wound vac application to right foot ;  Surgeon: Algernon Huxley, MD;  Location: [facility];  Service: Vascular;  Laterality: Right;  . AV FISTULA PLACEMENT Left   . AV FISTULA PLACEMENT Left 11/28/2016   Procedure: ARTERIOVENOUS (AV) FISTULA CREATION;  Surgeon: Algernon Huxley, MD;  Location: [facility];  Service: Vascular;  Laterality: Left;  . CARDIAC CATHETERIZATION     stent placement   . CORONARY ANGIOPLASTY    . DIALYSIS/PERMA CATHETER INSERTION N/A 07/31/2016   Procedure: Dialysis/Perma Catheter Insertion;  Surgeon: Algernon Huxley, MD;  Location: [facility];  Service: Cardiovascular;  Laterality: N/A;  . DIALYSIS/PERMA CATHETER INSERTION N/A 07/25/2017   Procedure: DIALYSIS/PERMA CATHETER INSERTION and fistulagram;  Surgeon: Algernon Huxley, MD;  Location: [place] [facility];  Service: Cardiovascular;  Laterality: N/A;  . DIALYSIS/PERMA CATHETER REMOVAL N/A 07/22/2017   Procedure: DIALYSIS/PERMA CATHETER REMOVAL;  Surgeon: Katha Cabal, MD;  Location: [place] [facility];  Service: Cardiovascular;  Laterality: N/A;  . IR FLUORO GUIDE CV LINE LEFT  04/10/2017  . LIGATION OF ARTERIOVENOUS  FISTULA Right 01/31/2016   Procedure: LIGATION OF ARTERIOVENOUS  FISTULA;  Surgeon: Algernon Huxley, MD;   Location: [facility];  Service: Vascular;  Laterality: Right;  . PERIPHERAL VASCULAR CATHETERIZATION Right 12/15/2014   Procedure: Lower Extremity Angiography;  Surgeon: Algernon Huxley, MD;  Location: [place] [facility];  Service: Cardiovascular;  Laterality: Right;  . PERIPHERAL VASCULAR CATHETERIZATION  12/15/2014   Procedure: Lower Extremity Intervention;  Surgeon: Algernon Huxley, MD;  Location: [place] [facility];  Service: Cardiovascular;;  . PERIPHERAL VASCULAR CATHETERIZATION Right 08/14/2015   Procedure: A/V Shuntogram/Fistulagram;  Surgeon: Algernon Huxley, MD;  Location: [place] [facility];  Service: Cardiovascular;  Laterality: Right;  . PERIPHERAL VASCULAR CATHETERIZATION N/A 08/14/2015   Procedure: A/V Shunt Intervention;  Surgeon: Algernon Huxley, MD;  Location: [place] [facility];  Service: Cardiovascular;  Laterality: N/A;  . PERIPHERAL VASCULAR CATHETERIZATION N/A 01/11/2016   Procedure: Dialysis/Perma Catheter Insertion;  Surgeon: Algernon Huxley, MD;  Location: [place] [facility];  Service: Cardiovascular;  Laterality: N/A;  . REVISON OF ARTERIOVENOUS FISTULA Right 02/17/2016   Procedure: removal of AV fistula;  Surgeon: Serafina Mitchell, MD;  Location: [facility];  Service: Vascular;  Laterality: Right;  . REVISON OF ARTERIOVENOUS FISTULA Right 01/31/2016   Procedure: REVISON OF ARTERIOVENOUS FISTULA ( BRACHIOCEPHALIC ) W/ ARTEGRAFT;  Surgeon: Algernon Huxley, MD;  Location: [facility];  Service: Vascular;  Laterality: Right;  . TRANSMETATARSAL AMPUTATION Right 05/04/2015   Procedure: TRANSMETATARSAL AMPUTATION REVISION, great toe amputation;  Surgeon: Algernon Huxley, MD;  Location: [facility];  Service: Vascular;  Laterality: Right;   Family History  Problem Relation Age of Onset  . Leukemia Mother   . Heart attack Father   . Heart failure Other   . Hypertension Other   . Leukemia Other   . Diabetes Other   . Prostate cancer Neg Hx   . Kidney cancer Neg Hx   . Bladder Cancer Neg Hx    Allergies    Allergen Reactions  . Dust Mite Extract Other (See Comments)    Reaction: unknown      Assessment & Plan:  Patient presents at the request of his dialysis center for a poorly functioning left upper extremity dialysis access.  The patient is currently being maintained by a left IJ PermCath without issue.  The patient denies any left upper extremity pain or ulceration.  The patient underwent a left upper extremity HDA which was notable for a overall flow volume of 369.  There is a hemodynamically significant narrowing at the [facility] fossa (string sign).  There is competing branch noted in the middle upper level of the cephalic vein.  The patient denies any uremic symptoms.  The patient denies any fever, nausea or vomiting.  The patient dialyzes Monday Wednesday Friday  1. ESRD (end stage renal disease) on dialysis ([place]) - Stable Patient presents at the request of his dialysis center for a poorly functioning left upper extremity brachiocephalic AV fistula The patient is currently being maintained by left IJ PermCath without issue HDA today with a low volume of 369.  There is a hemodynamically significant narrowing at the [facility] fossa (string sign).  There is competing branch noted in the middle upper level of the cephalic vein.  Recommend a left upper extremity fistulogram with possible intervention to assess the patient's anatomy and correct any areas of hemodynamically significant stenosis to provide the patient with an adequately functioning dialysis access Procedure, risks and benefits explained to the patient All questions answered Patient wishes to proceed  2. Hyperlipidemia, unspecified hyperlipidemia type - Stable Encouraged good control as its slows the progression of atherosclerotic disease  3. HYPERTENSION, BENIGN - Stable Encouraged good control as its slows the progression of atherosclerotic disease  Current Outpatient Medications on File Prior to Visit  Medication Sig Dispense Refill  .  acetaminophen (TYLENOL) 500 MG tablet Take 500 mg by mouth every 6 (six) hours as needed for mild pain.     [name] albuterol (PROVENTIL HFA;VENTOLIN HFA) 108 (90 Base) MCG/ACT inhaler Inhale 1-2 puffs every 6 (six) hours as needed into the lungs for wheezing or shortness of breath.    [name] apixaban (ELIQUIS) 5 MG TABS tablet Take 1 tablet (5 mg total) by mouth 2 (two) times daily. 60 tablet 2  . aspirin EC 325 MG tablet  Take 325 mg by mouth daily.     [name] atorvastatin (LIPITOR) 10 MG tablet Take 10 mg by mouth at bedtime.     . budesonide-formoterol (SYMBICORT) 160-4.5 MCG/ACT inhaler Inhale 2 puffs into the lungs 2 (two) times daily. Reported on 08/14/2015    . calcium acetate (PHOSLO) 667 MG capsule Take 667 mg by mouth daily.     . carvedilol (COREG) 3.125 MG tablet Take 1 tablet (3.125 mg total) by mouth 2 (two) times daily with a meal. 60 tablet 2  . ceFAZolin (ANCEF) 1-4 GM/50ML-% SOLN Inject 50 mLs (1 g total) into the vein every dialysis for up to 28 days. Monday , Wednesday and Friday with dialysis 1500 mL   . cetirizine (ZYRTEC) 10 MG tablet Take 10 mg by mouth daily.    [name] docusate sodium (COLACE) 100 MG capsule Take 100 mg by mouth daily as needed for mild constipation or moderate constipation.     . fluticasone (FLONASE) 50 MCG/ACT nasal spray Place 1 spray daily into the nose.     [name] guaifenesin (ROBAFEN) 100 MG/5ML syrup Take 200 mg by mouth 3 (three) times daily as needed for cough.    . Ipratropium-Albuterol (COMBIVENT RESPIMAT) 20-100 MCG/ACT AERS respimat Inhale 1 puff into the lungs every 6 (six) hours as needed for wheezing or shortness of breath.    . isosorbide mononitrate (IMDUR) 30 MG 24 hr tablet Take 30 mg by mouth daily.     . midodrine (PROAMATINE) 10 MG tablet Take 5 mg by mouth 2 (two) times daily.    . Multiple Vitamin (MULTIVITAMIN WITH MINERALS) TABS tablet Take 1 tablet by mouth 2 (two) times daily.    . nitroGLYCERIN (NITROSTAT) 0.4 MG SL tablet Place 0.4 mg under the tongue  every 5 (five) minutes as needed for chest pain.    . Nutritional Supplements (FEEDING SUPPLEMENT, NEPRO CARB STEADY,) LIQD Take 237 mLs by mouth 2 (two) times daily between meals. 14220 mL 0  . pantoprazole (PROTONIX) 20 MG tablet Take 20 mg by mouth daily.    . patiromer (VELTASSA) 8.4 g packet Take 1 packet (8.4 g total) by mouth daily. 30 packet 0  . polyethylene glycol (MIRALAX / GLYCOLAX) packet Take 17 g by mouth daily as needed for mild constipation.     . predniSONE (DELTASONE) 50 MG tablet Take 1 tablet daily for 3 days then stop 3 tablet 0  . pregabalin (LYRICA) 75 MG capsule Take 1 capsule (75 mg total) by mouth daily. 20 capsule 0  . sevelamer carbonate (RENVELA) 800 MG tablet Take 800 mg by mouth 3 (three) times daily with meals.     . tiotropium (SPIRIVA) 18 MCG inhalation capsule Place 18 mcg into inhaler and inhale daily.     No current facility-administered medications on file prior to visit.    There are no Patient Instructions on file for this visit. No Follow-up on file.  Kensy Blizard A Jahmir Salo, PA-C

## 2017-08-06 ENCOUNTER — Ambulatory Visit: Payer: Medicare Other | Attending: Internal Medicine

## 2017-08-06 DIAGNOSIS — G4733 Obstructive sleep apnea (adult) (pediatric): Secondary | ICD-10-CM | POA: Insufficient documentation

## 2017-08-07 ENCOUNTER — Other Ambulatory Visit (INDEPENDENT_AMBULATORY_CARE_PROVIDER_SITE_OTHER): Payer: Self-pay | Admitting: Vascular Surgery

## 2017-08-13 DIAGNOSIS — G4733 Obstructive sleep apnea (adult) (pediatric): Secondary | ICD-10-CM | POA: Diagnosis not present

## 2017-08-13 MED ORDER — CEFAZOLIN SODIUM-DEXTROSE 1-4 GM/50ML-% IV SOLN
1.0000 g | Freq: Once | INTRAVENOUS | Status: AC
  Administered 2017-08-14: 1 g via INTRAVENOUS

## 2017-08-14 ENCOUNTER — Encounter: Payer: Self-pay | Admitting: Emergency Medicine

## 2017-08-14 ENCOUNTER — Encounter
Admission: RE | Disposition: A | Discharge status: Skilled Nursing Facility | Payer: Self-pay | Source: Ambulatory Visit | Attending: Vascular Surgery

## 2017-08-14 ENCOUNTER — Ambulatory Visit
Admission: RE | Admit: 2017-08-14 | Discharge: 2017-08-14 | Disposition: A | Discharge status: Skilled Nursing Facility | Payer: Medicare Other | Source: Ambulatory Visit | Attending: Vascular Surgery | Admitting: Vascular Surgery

## 2017-08-14 DIAGNOSIS — T82858A Stenosis of vascular prosthetic devices, implants and grafts, initial encounter: Secondary | ICD-10-CM | POA: Insufficient documentation

## 2017-08-14 DIAGNOSIS — E785 Hyperlipidemia, unspecified: Secondary | ICD-10-CM | POA: Diagnosis not present

## 2017-08-14 DIAGNOSIS — I739 Peripheral vascular disease, unspecified: Secondary | ICD-10-CM | POA: Diagnosis not present

## 2017-08-14 DIAGNOSIS — F17210 Nicotine dependence, cigarettes, uncomplicated: Secondary | ICD-10-CM | POA: Diagnosis not present

## 2017-08-14 DIAGNOSIS — Z7951 Long term (current) use of inhaled steroids: Secondary | ICD-10-CM | POA: Insufficient documentation

## 2017-08-14 DIAGNOSIS — I252 Old myocardial infarction: Secondary | ICD-10-CM | POA: Insufficient documentation

## 2017-08-14 DIAGNOSIS — Y832 Surgical operation with anastomosis, bypass or graft as the cause of abnormal reaction of the patient, or of later complication, without mention of misadventure at the time of the procedure: Secondary | ICD-10-CM | POA: Insufficient documentation

## 2017-08-14 DIAGNOSIS — E213 Hyperparathyroidism, unspecified: Secondary | ICD-10-CM | POA: Diagnosis not present

## 2017-08-14 DIAGNOSIS — Z89512 Acquired absence of left leg below knee: Secondary | ICD-10-CM | POA: Insufficient documentation

## 2017-08-14 DIAGNOSIS — T82868A Thrombosis of vascular prosthetic devices, implants and grafts, initial encounter: Secondary | ICD-10-CM | POA: Diagnosis not present

## 2017-08-14 DIAGNOSIS — I5042 Chronic combined systolic (congestive) and diastolic (congestive) heart failure: Secondary | ICD-10-CM | POA: Diagnosis not present

## 2017-08-14 DIAGNOSIS — I132 Hypertensive heart and chronic kidney disease with heart failure and with stage 5 chronic kidney disease, or end stage renal disease: Secondary | ICD-10-CM | POA: Diagnosis not present

## 2017-08-14 DIAGNOSIS — Z86711 Personal history of pulmonary embolism: Secondary | ICD-10-CM | POA: Insufficient documentation

## 2017-08-14 DIAGNOSIS — Z8249 Family history of ischemic heart disease and other diseases of the circulatory system: Secondary | ICD-10-CM | POA: Insufficient documentation

## 2017-08-14 DIAGNOSIS — G4730 Sleep apnea, unspecified: Secondary | ICD-10-CM | POA: Diagnosis not present

## 2017-08-14 DIAGNOSIS — K219 Gastro-esophageal reflux disease without esophagitis: Secondary | ICD-10-CM | POA: Insufficient documentation

## 2017-08-14 DIAGNOSIS — J449 Chronic obstructive pulmonary disease, unspecified: Secondary | ICD-10-CM | POA: Insufficient documentation

## 2017-08-14 DIAGNOSIS — N186 End stage renal disease: Secondary | ICD-10-CM

## 2017-08-14 DIAGNOSIS — Z7982 Long term (current) use of aspirin: Secondary | ICD-10-CM | POA: Diagnosis not present

## 2017-08-14 DIAGNOSIS — Z89431 Acquired absence of right foot: Secondary | ICD-10-CM | POA: Insufficient documentation

## 2017-08-14 HISTORY — PX: A/V FISTULAGRAM: CATH118298

## 2017-08-14 LAB — POTASSIUM (ARMC VASCULAR LAB ONLY): POTASSIUM (ARMC VASCULAR LAB): 5.3 — AB (ref 3.5–5.1)

## 2017-08-14 SURGERY — A/V FISTULAGRAM
Anesthesia: Moderate Sedation | Laterality: Left

## 2017-08-14 MED ORDER — ONDANSETRON HCL 4 MG/2ML IJ SOLN: 4.0000 mg | Freq: Four times a day (QID) | INTRAMUSCULAR | Status: DC | PRN

## 2017-08-14 MED ORDER — METHYLPREDNISOLONE SODIUM SUCC 125 MG IJ SOLR: 125.0000 mg | INTRAMUSCULAR | Status: DC | PRN

## 2017-08-14 MED ORDER — HYDROMORPHONE HCL 1 MG/ML IJ SOLN: 1.0000 mg | Freq: Once | INTRAMUSCULAR | Status: DC | PRN

## 2017-08-14 MED ORDER — HEPARIN (PORCINE) IN NACL 2-0.9 UNIT/ML-% IJ SOLN
INTRAMUSCULAR | Status: AC
  Filled 2017-08-14: qty 1000

## 2017-08-14 MED ORDER — FENTANYL CITRATE (PF) 100 MCG/2ML IJ SOLN
INTRAMUSCULAR | Status: DC | PRN
  Administered 2017-08-14: 50 ug via INTRAVENOUS

## 2017-08-14 MED ORDER — MIDAZOLAM HCL 2 MG/2ML IJ SOLN
INTRAMUSCULAR | Status: DC | PRN
  Administered 2017-08-14: 2 mg via INTRAVENOUS

## 2017-08-14 MED ORDER — HEPARIN SODIUM (PORCINE) 1000 UNIT/ML IJ SOLN
INTRAMUSCULAR | Status: DC | PRN
  Administered 2017-08-14: 3000 [IU] via INTRAVENOUS

## 2017-08-14 MED ORDER — HEPARIN SODIUM (PORCINE) 1000 UNIT/ML IJ SOLN
INTRAMUSCULAR | Status: AC
  Filled 2017-08-14: qty 1

## 2017-08-14 MED ORDER — FAMOTIDINE 20 MG PO TABS: 40.0000 mg | ORAL_TABLET | ORAL | Status: DC | PRN

## 2017-08-14 MED ORDER — IOPAMIDOL (ISOVUE-300) INJECTION 61%
INTRAVENOUS | Status: DC | PRN
  Administered 2017-08-14: 20 mL via INTRAVENOUS

## 2017-08-14 MED ORDER — MIDAZOLAM HCL 5 MG/5ML IJ SOLN
INTRAMUSCULAR | Status: AC
  Filled 2017-08-14: qty 5

## 2017-08-14 MED ORDER — LIDOCAINE-EPINEPHRINE (PF) 1 %-1:200000 IJ SOLN
INTRAMUSCULAR | Status: AC
  Filled 2017-08-14: qty 30

## 2017-08-14 MED ORDER — SODIUM CHLORIDE 0.9 % IV SOLN
INTRAVENOUS | Status: DC
  Administered 2017-08-14: 08:00:00 via INTRAVENOUS

## 2017-08-14 MED ORDER — FENTANYL CITRATE (PF) 100 MCG/2ML IJ SOLN
INTRAMUSCULAR | Status: AC
  Filled 2017-08-14: qty 2

## 2017-08-14 SURGICAL SUPPLY — 9 items
BALLN LUTONIX DCB 6X60X130 (BALLOONS) ×3
BALLOON LUTONIX DCB 6X60X130 (BALLOONS) ×1 IMPLANT
CANNULA 5F STIFF (CANNULA) ×3 IMPLANT
DEVICE PRESTO INFLATION (MISCELLANEOUS) ×3 IMPLANT
DRAPE BRACHIAL (DRAPES) ×3 IMPLANT
PACK ANGIOGRAPHY (CUSTOM PROCEDURE TRAY) ×3 IMPLANT
SHEATH BRITE TIP 6FRX5.5 (SHEATH) ×6 IMPLANT
SUT MNCRL AB 4-0 PS2 18 (SUTURE) ×3 IMPLANT
WIRE MAGIC TOR.035 180C (WIRE) ×3 IMPLANT

## 2017-08-14 NOTE — H&P (Signed)
[facility] History & Physical Update  The patient was interviewed and re-examined.  The patient's previous History and Physical has been reviewed and is unchanged.  There is no change in the plan of care. We plan to proceed with the scheduled procedure.  Leotis Pain, MD  08/14/2017, 8:19 AM

## 2017-08-14 NOTE — Progress Notes (Signed)
Report called to the [facility], RN at facility verbalized understanding of discharge and follow up instructions.  Information reviewed with pt as well who verbalizes understanding.  Facility states they will send someone to pick up pt, ETA 30-43min.  Pt informed of wait.  Pt denies any further needs at this time.

## 2017-08-14 NOTE — Op Note (Signed)
[facility]    OPERATIVE NOTE   PROCEDURE: 1.   Left brachiocephalic arteriovenous fistula cannulation under ultrasound guidance 2.   Left arm fistulagram including central venogram 3.   Percutaneous transluminal angioplasty of distal upper arm cephalic vein with 6 mm diameter by 6 cm length Lutonix drug-coated angioplasty balloon  PRE-OPERATIVE DIAGNOSIS: 1. ESRD 2. Poorly functional left brachiocephalic AVF  POST-OPERATIVE DIAGNOSIS: same as above   SURGEON: Leotis Pain, MD  ANESTHESIA: local with MCS  ESTIMATED BLOOD LOSS: 5 cc  FINDING(S): 1. High-grade stenosis in the cephalic vein about 3-4 cm beyond the anastomosis in the distal upper arm of greater than 85%.  The remainder of the fistula and the central venous circulation appeared patent without significant stenosis  SPECIMEN(S):  None  CONTRAST: 20 cc  FLUORO TIME: 0.6 minutes  MODERATE CONSCIOUS SEDATION TIME: Approximately 15 minutes with 2 mg of Versed and 50 Mcg of Fentanyl   INDICATIONS: [name] is a 62 y.o. male who presents with malfunctioning left brachiocephalic arteriovenous fistula.  The patient is scheduled for left arm fistulagram.  The patient is aware the risks include but are not limited to: bleeding, infection, thrombosis of the cannulated access, and possible anaphylactic reaction to the contrast.  The patient is aware of the risks of the procedure and elects to proceed forward.  DESCRIPTION: After full informed written consent was obtained, the patient was brought back to the angiography suite and placed supine upon the angiography table.  The patient was connected to monitoring equipment. Moderate conscious sedation was administered with a face to face encounter with the patient throughout the procedure with my supervision of the RN administering medicines and monitoring the patient's vital signs and mental status throughout from the start of the procedure until the  patient was taken to the recovery room. The left arm was prepped and draped in the standard fashion for a percutaneous access intervention.  Under ultrasound guidance, the proximal to mid upper arm portion of the left brachiocephalic arteriovenous fistula was cannulated with a micropuncture needle under direct ultrasound guidance retrograde fashion and a permanent image was performed.  The microwire was advanced into the fistula and the needle was exchanged for the a microsheath.  I then upsized to a 6 Fr Sheath and imaging was performed.  Hand injections were completed to image the access including the central venous system. This demonstrated high-grade stenosis in the cephalic vein about 3-4 cm beyond the anastomosis in the distal upper arm of greater than 85%.  The remainder of the fistula and the central venous circulation appeared patent without significant stenosis.  Based on the images, this patient will need intervention to this area of stenosis to make the fistula functional. I then gave the patient 3000 units of intravenous heparin.  I then crossed the stenosis with a Magic Tourqe wire.  Based on the imaging, a 6 mm x 6 cm Lutonix drug-coated angioplasty balloon was selected.  The balloon was centered around the distal upper arm cephalic vein stenosis and inflated to 14 ATM for 1 minute(s).  On completion imaging, a 15 % residual stenosis was present.     Based on the completion imaging, no further intervention is necessary.  The wire and balloon were removed from the sheath.  A 4-0 Monocryl purse-string suture was sewn around the sheath.  The sheath was removed while tying down the suture.  A sterile bandage was applied to the puncture site.  COMPLICATIONS: None  CONDITION: Stable   Leotis Pain  08/14/2017 9:58 AM   This note was created with Dragon Medical transcription system. Any errors in dictation are purely unintentional.

## 2017-08-15 ENCOUNTER — Telehealth: Payer: Self-pay | Admitting: *Deleted

## 2017-08-15 DIAGNOSIS — G4733 Obstructive sleep apnea (adult) (pediatric): Secondary | ICD-10-CM

## 2017-08-15 NOTE — Telephone Encounter (Signed)
Spoke with Rachel Bo at the [facility] where the pt lives and informed him that we are placing an order for the pt to be set up on a BiPAP machine. Orders placed. Faxed copy of report to [place] at [phone].  Nothing further needed.

## 2017-08-21 ENCOUNTER — Ambulatory Visit: Payer: Medicare Other | Admitting: Family

## 2017-08-27 IMAGING — DX DG CHEST 1V PORT
1 series · 1 of 1 positions shown · non-contrast
Comparison: Chest radiograph and CTA of the chest performed
08/03/2016

CLINICAL DATA: Acute onset of shortness of breath and generalized
chest pain. Initial encounter.

EXAM:
PORTABLE CHEST 1 VIEW

[chest ap]
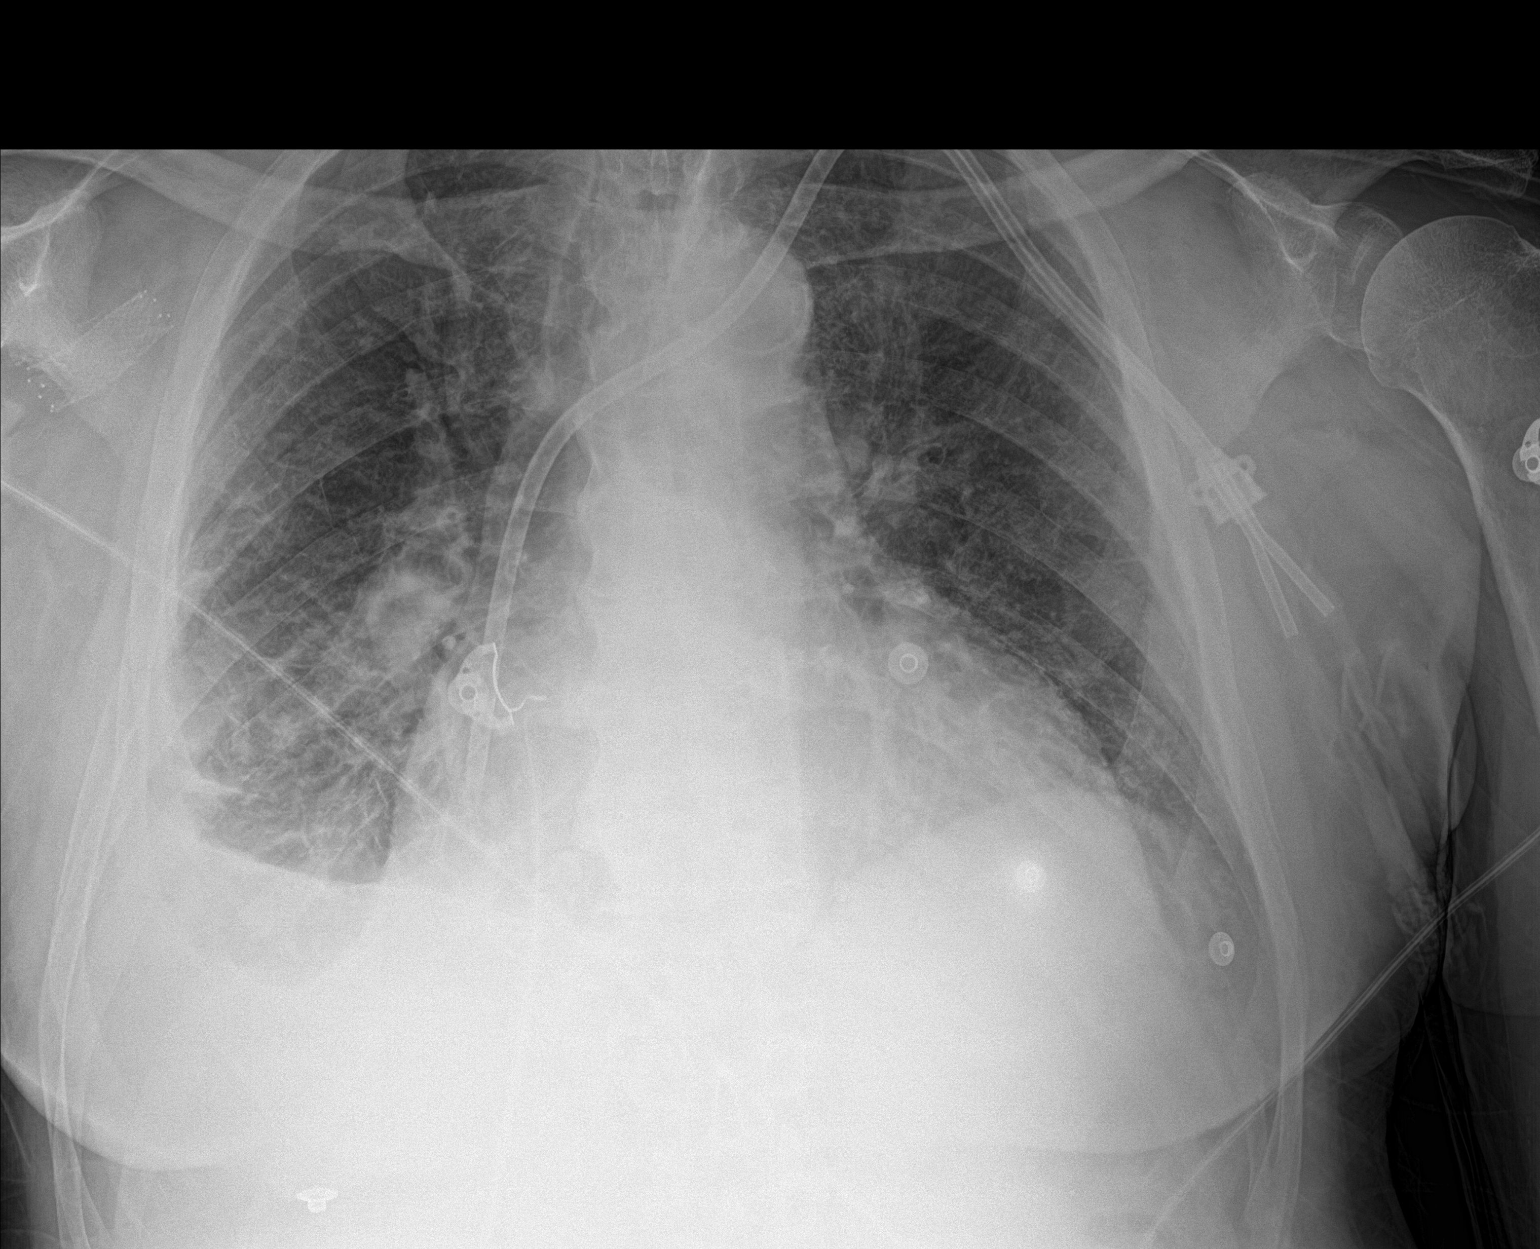

[1 of 1 positions shown; findings below may reference images not displayed]

FINDINGS: The lungs are well-aerated. A small right pleural effusion is noted.
Increased interstitial markings raise concern for mild interstitial
edema. No pneumothorax is seen.

The cardiomediastinal silhouette is mildly enlarged. A left-sided
dual-lumen catheter is noted ending about the right cavoatrial
junction. No acute osseous abnormalities are seen.
IMPRESSION: Small right pleural effusion. Increased interstitial markings raise
concern for mild interstitial edema. Mild cardiomegaly noted.

## 2017-09-08 ENCOUNTER — Encounter (INDEPENDENT_AMBULATORY_CARE_PROVIDER_SITE_OTHER): Payer: Self-pay

## 2017-09-08 ENCOUNTER — Other Ambulatory Visit (INDEPENDENT_AMBULATORY_CARE_PROVIDER_SITE_OTHER): Payer: Self-pay | Admitting: Vascular Surgery

## 2017-09-08 ENCOUNTER — Telehealth (INDEPENDENT_AMBULATORY_CARE_PROVIDER_SITE_OTHER): Payer: Self-pay

## 2017-09-08 NOTE — Telephone Encounter (Signed)
I called Mark back. He went on to say that the patient could not wait until this Thursday to have the needed procedure done. The patient has been scheduled with another provider to have the need procedure done. Elta Guadeloupe proceeded to cancel the patient off of Dr. Bunnie Domino schedule for this Thursday.

## 2017-09-08 NOTE — Telephone Encounter (Signed)
Not sure what you mean about this, the patient has a permcath and it needs to be exchanged, he is on the schedule for Thursday for this to be done.

## 2017-09-08 NOTE — Telephone Encounter (Signed)
Mark from [facility] called and stated that the patient's port has matured and that it needs exchanging and that he also needs his sutures removed.  Mark at [facility] [phone]

## 2017-09-08 NOTE — Telephone Encounter (Signed)
Patient canceled per dialysis center. See notes below.

## 2017-09-08 NOTE — Progress Notes (Deleted)
   Patient ID: [name], male    DOB: 09-26-1955, 62 y.o.   MRN: [id number]  HPI  [name] is a 62 y/o male with a history of  Echo report from 07/23/17 reviewed and showed an EF of 20-25%.  Admitted 07/21/17 due to acute on chronic respiratory failure. Given oral steroids due to acute bronchitis. Received dialysis while inpatient. ID and vascular consults were obtained. MSSA bacteremia was noted due to IJ dialysis catheter which was subsequently removed. Elevated troponin thought to be due to demand ischemia. Discharged after 4 days.   He presents today for his initial visit with a chief complaint of  Review of Systems    Physical Exam  Assessment & Plan:  1: Chronic heart failure with reduced ejection fraction- - NYHA class - saw cardiology [name]) 07/31/17 - saw pulmonologist [name]) 07/15/17 - BNP on 07/21/17 was >4500.0  2: HTN- - BMP on 07/25/17 reviewed and showed sodium 133, potassium 6.2 and GFR 4  3: ESRD- - receives dialysis [facility], [facility], [facility] - saw vascular [name]) 08/05/17

## 2017-09-09 ENCOUNTER — Ambulatory Visit: Payer: Medicare Other | Admitting: Family

## 2017-09-10 NOTE — Progress Notes (Deleted)
[facility] Consultation      Assessment and Plan:  Obstructive sleep apnea. -History of sleep apnea diagnosed remotely, did not tolerate CPAP and it was taken away.  Continues to have significant symptoms of daytime sleepiness. -Per insurance requirements we will need to send him for a new sleep study in order to requalify him for CPAP. -Patient was previously intolerant to due to full facemask, hopefully we can use a nasal mask that will be better tolerated.  Essential hypertension, diabetes mellitus, systolic congestive heart failure. -These conditions can contribute to obstructive sleep apnea, and are contributing to by obstructive sleep apnea, therefore appropriate treatment the patient's underlying sleep apnea is an important part of their management.  Nicotine abuse. -Discussed the importance of smoke cessation on his health.  Spent greater than 3 minutes in discussion.   Date: 09/10/2017  MRN# [id number] [name] 07/25/1955  Referring Physician: Hospitalist physician.   [name] is a 62 y.o. old male seen in consultation for chief complaint of:    No chief complaint on file.   HPI:   The patient is a 62 year old male with a history of copd, continued nicotine above, persistent right pleural effusion, ascites, history of CAD with chronic systolic congestive heart failure, CHF (EF 20 %), hypertension, hyperlipidemia, history of PE, end-stage renal disease on hemodialysis, peripheral vascular disease status post left below the knee amputation.    As last visit he was noted to have symptoms and signs of obstructive sleep apnea, he was sent for sleep study.  Since that time he has had another admission to the hospital for heart failure volume overload.    He is smoking 3 cigs per day, one at each meal. He has quit in the past but then restarted. He is thinking about quitting but not yet ready.   Social Hx:   Social History   Tobacco Use  .  Smoking status: Light Tobacco Smoker    Packs/day: 0.25    Years: 30.00    Pack years: 7.50    Types: Cigarettes  . Smokeless tobacco: Never Used  . Tobacco comment: 5  Substance Use Topics  . Alcohol use: No    Alcohol/week: 0.0 oz  . Drug use: No    Comment: has used crack cocaine in past    Medication:    Current Outpatient Medications:  .  acetaminophen (TYLENOL) 500 MG tablet, Take 500 mg by mouth every 6 (six) hours as needed for mild pain. , Disp: , Rfl:  .  albuterol (PROVENTIL HFA;VENTOLIN HFA) 108 (90 Base) MCG/ACT inhaler, Inhale 1-2 puffs every 6 (six) hours as needed into the lungs for wheezing or shortness of breath., Disp: , Rfl:  .  apixaban (ELIQUIS) 5 MG TABS tablet, Take 1 tablet (5 mg total) by mouth 2 (two) times daily., Disp: 60 tablet, Rfl: 2 .  aspirin EC 325 MG tablet, Take 325 mg by mouth daily. , Disp: , Rfl:  .  atorvastatin (LIPITOR) 10 MG tablet, Take 10 mg by mouth at bedtime. , Disp: , Rfl:  .  budesonide-formoterol (SYMBICORT) 160-4.5 MCG/ACT inhaler, Inhale 2 puffs into the lungs 2 (two) times daily. Reported on 08/14/2015, Disp: , Rfl:  .  calcium acetate (PHOSLO) 667 MG capsule, Take 667 mg by mouth daily. , Disp: , Rfl:  .  carvedilol (COREG) 3.125 MG tablet, Take 1 tablet (3.125 mg total) by mouth 2 (two) times daily with a meal., Disp: 60 tablet, Rfl: 2 .  cetirizine (ZYRTEC) 10 MG tablet, Take 10 mg by mouth daily., Disp: , Rfl:  .  docusate sodium (COLACE) 100 MG capsule, Take 100 mg by mouth daily as needed for mild constipation or moderate constipation. , Disp: , Rfl:  .  fluticasone (FLONASE) 50 MCG/ACT nasal spray, Place 1 spray daily into the nose. , Disp: , Rfl:  .  guaifenesin (ROBAFEN) 100 MG/5ML syrup, Take 200 mg by mouth 3 (three) times daily as needed for cough., Disp: , Rfl:  .  Ipratropium-Albuterol (COMBIVENT RESPIMAT) 20-100 MCG/ACT AERS respimat, Inhale 1 puff into the lungs every 6 (six) hours as needed for wheezing or shortness of  breath., Disp: , Rfl:  .  isosorbide mononitrate (IMDUR) 30 MG 24 hr tablet, Take 30 mg by mouth daily. , Disp: , Rfl:  .  midodrine (PROAMATINE) 10 MG tablet, Take 5 mg by mouth 2 (two) times daily., Disp: , Rfl:  .  Multiple Vitamin (MULTIVITAMIN WITH MINERALS) TABS tablet, Take 1 tablet by mouth 2 (two) times daily., Disp: , Rfl:  .  nitroGLYCERIN (NITROSTAT) 0.4 MG SL tablet, Place 0.4 mg under the tongue every 5 (five) minutes as needed for chest pain., Disp: , Rfl:  .  pantoprazole (PROTONIX) 20 MG tablet, Take 20 mg by mouth daily., Disp: , Rfl:  .  patiromer (VELTASSA) 8.4 g packet, Take 1 packet (8.4 g total) by mouth daily., Disp: 30 packet, Rfl: 0 .  polyethylene glycol (MIRALAX / GLYCOLAX) packet, Take 17 g by mouth daily as needed for mild constipation. , Disp: , Rfl:  .  predniSONE (DELTASONE) 50 MG tablet, Take 1 tablet daily for 3 days then stop (Patient not taking: Reported on 08/14/2017), Disp: 3 tablet, Rfl: 0 .  pregabalin (LYRICA) 75 MG capsule, Take 1 capsule (75 mg total) by mouth daily., Disp: 20 capsule, Rfl: 0 .  sevelamer carbonate (RENVELA) 800 MG tablet, Take 800 mg by mouth 3 (three) times daily with meals. , Disp: , Rfl:  .  tiotropium (SPIRIVA) 18 MCG inhalation capsule, Place 18 mcg into inhaler and inhale daily., Disp: , Rfl:    Allergies:  Dust mite extract  Review of Systems: Gen:  Denies  fever, sweats, chills HEENT: Denies blurred vision, double vision. bleeds, sore throat Cvc:  No dizziness, chest pain. Resp:   Denies cough or sputum production, shortness of breath Gi: Denies swallowing difficulty, stomach pain. Gu:  Denies bladder incontinence, burning urine Ext:   No Joint pain, stiffness. Skin: No skin rash,  hives  Endoc:  No polyuria, polydipsia. Psych: No depression, insomnia. Other:  All other systems were reviewed with the patient and were negative other that what is mentioned in the HPI.   Physical Examination:   VS: There were no vitals  taken for this visit.  General Appearance: No distress  Neuro:without focal findings,  speech normal,  HEENT: PERRLA, EOM intact.  Mallampati 4 Pulmonary: normal breath sounds, No wheezing.  CardiovascularNormal S1,S2.  No m/r/g.   Abdomen: Benign, Soft, non-tender. Renal:  No costovertebral tenderness  GU:  No performed at this time. Endoc: No evident thyromegaly, no signs of acromegaly. Skin:   warm, no rashes, no ecchymosis  Extremities: normal, no cyanosis, left foot amputation  Other findings:    LABORATORY PANEL:   CBC No results for input(s): WBC, HGB, HCT, PLT in the last 168 hours. ------------------------------------------------------------------------------------------------------------------  Chemistries  No results for input(s): NA, K, CL, CO2, GLUCOSE, BUN, CREATININE, CALCIUM, MG, AST, ALT, ALKPHOS, BILITOT in  the last 168 hours.  Invalid input(s): GFRCGP ------------------------------------------------------------------------------------------------------------------  Cardiac Enzymes No results for input(s): TROPONINI in the last 168 hours. ------------------------------------------------------------  RADIOLOGY:  No results found.     Thank  you for the consultation and for allowing [facility] to assist in the care of your patient. Our recommendations are noted above.  Please contact us if we can be of further service.   Marda Stalker, MD.  Board Certified in Internal Medicine, Pulmonary Medicine, [place], and Sleep Medicine.   Pulmonary and Critical Care Office Number: [phone]  Patricia Pesa, M.D.  Merton Border, M.D  09/10/2017

## 2017-09-11 ENCOUNTER — Ambulatory Visit: Payer: Medicare Other | Admitting: Internal Medicine

## 2017-09-11 ENCOUNTER — Ambulatory Visit: Admission: RE | Admit: 2017-09-11 | Payer: Medicare Other | Source: Ambulatory Visit | Admitting: Vascular Surgery

## 2017-09-11 ENCOUNTER — Encounter: Admission: RE | Payer: Self-pay | Source: Ambulatory Visit

## 2017-09-11 SURGERY — DIALYSIS/PERMA CATHETER INSERTION
Anesthesia: Moderate Sedation

## 2017-09-18 ENCOUNTER — Inpatient Hospital Stay
Admission: EM | Admit: 2017-09-18 | Discharge: 2017-09-19 | DRG: 193 | Disposition: A | Discharge status: Home / Self Care | Payer: Medicare Other | Attending: Internal Medicine | Admitting: Internal Medicine

## 2017-09-18 ENCOUNTER — Emergency Department: Payer: Medicare Other

## 2017-09-18 ENCOUNTER — Other Ambulatory Visit: Payer: Self-pay

## 2017-09-18 DIAGNOSIS — G4730 Sleep apnea, unspecified: Secondary | ICD-10-CM | POA: Diagnosis present

## 2017-09-18 DIAGNOSIS — R0902 Hypoxemia: Secondary | ICD-10-CM | POA: Diagnosis not present

## 2017-09-18 DIAGNOSIS — J181 Lobar pneumonia, unspecified organism: Secondary | ICD-10-CM | POA: Diagnosis not present

## 2017-09-18 DIAGNOSIS — I2510 Atherosclerotic heart disease of native coronary artery without angina pectoris: Secondary | ICD-10-CM | POA: Diagnosis present

## 2017-09-18 DIAGNOSIS — R1084 Generalized abdominal pain: Secondary | ICD-10-CM

## 2017-09-18 DIAGNOSIS — Z7901 Long term (current) use of anticoagulants: Secondary | ICD-10-CM | POA: Diagnosis not present

## 2017-09-18 DIAGNOSIS — I429 Cardiomyopathy, unspecified: Secondary | ICD-10-CM | POA: Diagnosis not present

## 2017-09-18 DIAGNOSIS — I252 Old myocardial infarction: Secondary | ICD-10-CM | POA: Diagnosis not present

## 2017-09-18 DIAGNOSIS — D631 Anemia in chronic kidney disease: Secondary | ICD-10-CM | POA: Diagnosis not present

## 2017-09-18 DIAGNOSIS — J440 Chronic obstructive pulmonary disease with acute lower respiratory infection: Secondary | ICD-10-CM | POA: Diagnosis not present

## 2017-09-18 DIAGNOSIS — F17210 Nicotine dependence, cigarettes, uncomplicated: Secondary | ICD-10-CM | POA: Diagnosis present

## 2017-09-18 DIAGNOSIS — N186 End stage renal disease: Secondary | ICD-10-CM | POA: Diagnosis not present

## 2017-09-18 DIAGNOSIS — Z7951 Long term (current) use of inhaled steroids: Secondary | ICD-10-CM

## 2017-09-18 DIAGNOSIS — Z86718 Personal history of other venous thrombosis and embolism: Secondary | ICD-10-CM | POA: Diagnosis not present

## 2017-09-18 DIAGNOSIS — I739 Peripheral vascular disease, unspecified: Secondary | ICD-10-CM | POA: Diagnosis present

## 2017-09-18 DIAGNOSIS — J90 Pleural effusion, not elsewhere classified: Secondary | ICD-10-CM

## 2017-09-18 DIAGNOSIS — I132 Hypertensive heart and chronic kidney disease with heart failure and with stage 5 chronic kidney disease, or end stage renal disease: Secondary | ICD-10-CM | POA: Diagnosis present

## 2017-09-18 DIAGNOSIS — Z7982 Long term (current) use of aspirin: Secondary | ICD-10-CM | POA: Diagnosis not present

## 2017-09-18 DIAGNOSIS — N2581 Secondary hyperparathyroidism of renal origin: Secondary | ICD-10-CM | POA: Diagnosis present

## 2017-09-18 DIAGNOSIS — Z806 Family history of leukemia: Secondary | ICD-10-CM

## 2017-09-18 DIAGNOSIS — Z89431 Acquired absence of right foot: Secondary | ICD-10-CM

## 2017-09-18 DIAGNOSIS — K219 Gastro-esophageal reflux disease without esophagitis: Secondary | ICD-10-CM | POA: Diagnosis present

## 2017-09-18 DIAGNOSIS — Y95 Nosocomial condition: Secondary | ICD-10-CM | POA: Diagnosis not present

## 2017-09-18 DIAGNOSIS — I959 Hypotension, unspecified: Secondary | ICD-10-CM | POA: Diagnosis not present

## 2017-09-18 DIAGNOSIS — Z8249 Family history of ischemic heart disease and other diseases of the circulatory system: Secondary | ICD-10-CM

## 2017-09-18 DIAGNOSIS — I5022 Chronic systolic (congestive) heart failure: Secondary | ICD-10-CM | POA: Diagnosis not present

## 2017-09-18 DIAGNOSIS — Z89512 Acquired absence of left leg below knee: Secondary | ICD-10-CM | POA: Diagnosis not present

## 2017-09-18 DIAGNOSIS — I2782 Chronic pulmonary embolism: Secondary | ICD-10-CM | POA: Diagnosis not present

## 2017-09-18 DIAGNOSIS — Z992 Dependence on renal dialysis: Secondary | ICD-10-CM

## 2017-09-18 DIAGNOSIS — Z9981 Dependence on supplemental oxygen: Secondary | ICD-10-CM | POA: Diagnosis not present

## 2017-09-18 DIAGNOSIS — J189 Pneumonia, unspecified organism: Secondary | ICD-10-CM | POA: Diagnosis present

## 2017-09-18 DIAGNOSIS — E785 Hyperlipidemia, unspecified: Secondary | ICD-10-CM | POA: Diagnosis present

## 2017-09-18 LAB — COMPREHENSIVE METABOLIC PANEL
ALK PHOS: 152 U/L — AB (ref 38–126)
ALT: 49 U/L (ref 17–63)
AST: 135 U/L — ABNORMAL HIGH (ref 15–41)
Albumin: 2.9 g/dL — ABNORMAL LOW (ref 3.5–5.0)
Anion gap: 9 (ref 5–15)
BILIRUBIN TOTAL: 1.2 mg/dL (ref 0.3–1.2)
BUN: 53 mg/dL — ABNORMAL HIGH (ref 6–20)
CALCIUM: 9 mg/dL (ref 8.9–10.3)
CO2: 28 mmol/L (ref 22–32)
CREATININE: 7.48 mg/dL — AB (ref 0.61–1.24)
Chloride: 103 mmol/L (ref 101–111)
GFR calc Af Amer: 8 mL/min — ABNORMAL LOW (ref >60)
GFR, EST NON AFRICAN AMERICAN: 7 mL/min — AB (ref >60)
Glucose, Bld: 119 mg/dL — ABNORMAL HIGH (ref 65–99)
Potassium: 4.9 mmol/L (ref 3.5–5.1)
Sodium: 140 mmol/L (ref 135–145)
TOTAL PROTEIN: 7.3 g/dL (ref 6.5–8.1)

## 2017-09-18 LAB — CBC WITH DIFFERENTIAL/PLATELET
Basophils Absolute: 0 10*3/uL (ref 0–0.1)
Basophils Relative: 1 %
Eosinophils Absolute: 0.2 10*3/uL (ref 0–0.7)
Eosinophils Relative: 3 %
HCT: 38.9 % — ABNORMAL LOW (ref 40.0–52.0)
HEMOGLOBIN: 12.6 g/dL — AB (ref 13.0–18.0)
LYMPHS ABS: 1.4 10*3/uL (ref 1.0–3.6)
LYMPHS PCT: 25 %
MCH: 31.1 pg (ref 26.0–34.0)
MCHC: 32.3 g/dL (ref 32.0–36.0)
MCV: 96 fL (ref 80.0–100.0)
Monocytes Absolute: 1.1 10*3/uL — ABNORMAL HIGH (ref 0.2–1.0)
Monocytes Relative: 20 %
NEUTROS PCT: 51 %
Neutro Abs: 2.8 10*3/uL (ref 1.4–6.5)
Platelets: 152 10*3/uL (ref 150–440)
RBC: 4.05 MIL/uL — AB (ref 4.40–5.90)
RDW: 20.3 % — ABNORMAL HIGH (ref 11.5–14.5)
WBC: 5.6 10*3/uL (ref 3.8–10.6)

## 2017-09-18 LAB — BRAIN NATRIURETIC PEPTIDE: B NATRIURETIC PEPTIDE 5: 754 pg/mL — AB (ref 0.0–100.0)

## 2017-09-18 LAB — MAGNESIUM: Magnesium: 2.5 mg/dL — ABNORMAL HIGH (ref 1.7–2.4)

## 2017-09-18 LAB — MRSA PCR SCREENING: MRSA by PCR: POSITIVE — AB

## 2017-09-18 LAB — TROPONIN I: TROPONIN I: 0.16 ng/mL — AB (ref <0.03)

## 2017-09-18 LAB — LACTIC ACID, PLASMA: Lactic Acid, Venous: 1.3 mmol/L (ref 0.5–1.9)

## 2017-09-18 MED ORDER — ATORVASTATIN CALCIUM 20 MG PO TABS
10.0000 mg | ORAL_TABLET | Freq: Every day | ORAL | Status: DC
  Administered 2017-09-18: 22:00:00 10 mg via ORAL
  Filled 2017-09-18: qty 1

## 2017-09-18 MED ORDER — IPRATROPIUM-ALBUTEROL 0.5-2.5 (3) MG/3ML IN SOLN: 3.0000 mL | Freq: Four times a day (QID) | RESPIRATORY_TRACT | Status: DC | PRN

## 2017-09-18 MED ORDER — PATIROMER SORBITEX CALCIUM 8.4 G PO PACK
8.4000 g | PACK | Freq: Every day | ORAL | Status: DC
  Filled 2017-09-18 (×2): qty 4

## 2017-09-18 MED ORDER — NITROGLYCERIN 0.4 MG SL SUBL: 0.4000 mg | SUBLINGUAL_TABLET | SUBLINGUAL | Status: DC | PRN

## 2017-09-18 MED ORDER — CALCIUM ACETATE (PHOS BINDER) 667 MG PO CAPS
667.0000 mg | ORAL_CAPSULE | Freq: Every day | ORAL | Status: DC
  Administered 2017-09-19: 667 mg via ORAL
  Filled 2017-09-18: qty 1

## 2017-09-18 MED ORDER — IOPAMIDOL (ISOVUE-370) INJECTION 76%
125.0000 mL | Freq: Once | INTRAVENOUS | Status: AC | PRN
  Administered 2017-09-18: 125 mL via INTRAVENOUS

## 2017-09-18 MED ORDER — VANCOMYCIN HCL IN DEXTROSE 1-5 GM/200ML-% IV SOLN
1000.0000 mg | Freq: Once | INTRAVENOUS | Status: AC
  Administered 2017-09-18: 1000 mg via INTRAVENOUS
  Filled 2017-09-18: qty 200

## 2017-09-18 MED ORDER — ONDANSETRON HCL 4 MG/2ML IJ SOLN
4.0000 mg | INTRAMUSCULAR | Status: AC
  Administered 2017-09-18: 4 mg via INTRAVENOUS
  Filled 2017-09-18: qty 2

## 2017-09-18 MED ORDER — ACETAMINOPHEN 325 MG PO TABS: 650.0000 mg | ORAL_TABLET | Freq: Four times a day (QID) | ORAL | Status: DC | PRN

## 2017-09-18 MED ORDER — SODIUM CHLORIDE 0.9 % IV SOLN
1.0000 g | INTRAVENOUS | Status: DC
  Filled 2017-09-18: qty 1

## 2017-09-18 MED ORDER — VANCOMYCIN HCL 10 G IV SOLR
1250.0000 mg | INTRAVENOUS | Status: DC
  Filled 2017-09-18: qty 1250

## 2017-09-18 MED ORDER — TIOTROPIUM BROMIDE MONOHYDRATE 18 MCG IN CAPS
18.0000 ug | ORAL_CAPSULE | Freq: Every day | RESPIRATORY_TRACT | Status: DC
  Administered 2017-09-18 – 2017-09-19 (×2): 18 ug via RESPIRATORY_TRACT
  Filled 2017-09-18: qty 5

## 2017-09-18 MED ORDER — PANTOPRAZOLE SODIUM 40 MG PO TBEC
40.0000 mg | DELAYED_RELEASE_TABLET | Freq: Every day | ORAL | Status: DC
  Administered 2017-09-18 – 2017-09-19 (×2): 40 mg via ORAL
  Filled 2017-09-18 (×2): qty 1

## 2017-09-18 MED ORDER — TRAMADOL HCL 50 MG PO TABS: 50.0000 mg | ORAL_TABLET | Freq: Four times a day (QID) | ORAL | Status: DC | PRN

## 2017-09-18 MED ORDER — MUPIROCIN 2 % EX OINT
1.0000 "application " | TOPICAL_OINTMENT | Freq: Two times a day (BID) | CUTANEOUS | Status: DC
  Administered 2017-09-18 – 2017-09-19 (×2): 1 via NASAL
  Filled 2017-09-18: qty 22

## 2017-09-18 MED ORDER — CARVEDILOL 3.125 MG PO TABS
3.1250 mg | ORAL_TABLET | Freq: Two times a day (BID) | ORAL | Status: DC
  Administered 2017-09-19: 17:00:00 3.125 mg via ORAL
  Filled 2017-09-18: qty 1

## 2017-09-18 MED ORDER — DOCUSATE SODIUM 100 MG PO CAPS: 100.0000 mg | ORAL_CAPSULE | Freq: Every day | ORAL | Status: DC | PRN

## 2017-09-18 MED ORDER — ADULT MULTIVITAMIN W/MINERALS CH
1.0000 | ORAL_TABLET | Freq: Two times a day (BID) | ORAL | Status: DC
  Administered 2017-09-18 – 2017-09-19 (×2): 1 via ORAL
  Filled 2017-09-18 (×3): qty 1

## 2017-09-18 MED ORDER — PREGABALIN 75 MG PO CAPS
75.0000 mg | ORAL_CAPSULE | Freq: Every day | ORAL | Status: DC
  Administered 2017-09-18 – 2017-09-19 (×2): 75 mg via ORAL
  Filled 2017-09-18 (×2): qty 1

## 2017-09-18 MED ORDER — VANCOMYCIN HCL 10 G IV SOLR
1250.0000 mg | Freq: Once | INTRAVENOUS | Status: AC
  Administered 2017-09-18: 1250 mg via INTRAVENOUS
  Filled 2017-09-18: qty 1250

## 2017-09-18 MED ORDER — MORPHINE SULFATE (PF) 4 MG/ML IV SOLN
4.0000 mg | Freq: Once | INTRAVENOUS | Status: AC
  Administered 2017-09-18: 4 mg via INTRAVENOUS
  Filled 2017-09-18: qty 1

## 2017-09-18 MED ORDER — POLYETHYLENE GLYCOL 3350 17 G PO PACK: 17.0000 g | PACK | Freq: Every day | ORAL | Status: DC | PRN

## 2017-09-18 MED ORDER — MIDODRINE HCL 5 MG PO TABS
5.0000 mg | ORAL_TABLET | Freq: Two times a day (BID) | ORAL | Status: DC
  Administered 2017-09-18 – 2017-09-19 (×3): 5 mg via ORAL
  Filled 2017-09-18 (×5): qty 1

## 2017-09-18 MED ORDER — SODIUM CHLORIDE 0.9 % IV BOLUS
500.0000 mL | INTRAVENOUS | Status: AC
  Administered 2017-09-18: 500 mL via INTRAVENOUS

## 2017-09-18 MED ORDER — MOMETASONE FURO-FORMOTEROL FUM 200-5 MCG/ACT IN AERO
2.0000 | INHALATION_SPRAY | Freq: Two times a day (BID) | RESPIRATORY_TRACT | Status: DC
  Administered 2017-09-18 – 2017-09-19 (×3): 2 via RESPIRATORY_TRACT
  Filled 2017-09-18: qty 8.8

## 2017-09-18 MED ORDER — ACETAMINOPHEN 650 MG RE SUPP: 650.0000 mg | Freq: Four times a day (QID) | RECTAL | Status: DC | PRN

## 2017-09-18 MED ORDER — APIXABAN 5 MG PO TABS
5.0000 mg | ORAL_TABLET | Freq: Two times a day (BID) | ORAL | Status: DC
  Administered 2017-09-18 (×2): 5 mg via ORAL
  Filled 2017-09-18 (×2): qty 1

## 2017-09-18 MED ORDER — SODIUM CHLORIDE 0.9 % IV SOLN: 2.0000 g | INTRAVENOUS | Status: DC

## 2017-09-18 MED ORDER — ISOSORBIDE MONONITRATE ER 30 MG PO TB24
30.0000 mg | ORAL_TABLET | Freq: Every day | ORAL | Status: DC
Start: 2017-09-18 — End: 2017-09-19
  Administered 2017-09-18 – 2017-09-19 (×2): 30 mg via ORAL
  Filled 2017-09-18 (×2): qty 1

## 2017-09-18 MED ORDER — ONDANSETRON HCL 4 MG PO TABS: 4.0000 mg | ORAL_TABLET | Freq: Four times a day (QID) | ORAL | Status: DC | PRN

## 2017-09-18 MED ORDER — LORATADINE 10 MG PO TABS
10.0000 mg | ORAL_TABLET | Freq: Every day | ORAL | Status: DC
  Administered 2017-09-18 – 2017-09-19 (×2): 10 mg via ORAL
  Filled 2017-09-18 (×2): qty 1

## 2017-09-18 MED ORDER — ALBUTEROL SULFATE (2.5 MG/3ML) 0.083% IN NEBU: 3.0000 mL | INHALATION_SOLUTION | Freq: Four times a day (QID) | RESPIRATORY_TRACT | Status: DC | PRN

## 2017-09-18 MED ORDER — ACETAMINOPHEN 500 MG PO TABS: 500.0000 mg | ORAL_TABLET | Freq: Four times a day (QID) | ORAL | Status: DC | PRN

## 2017-09-18 MED ORDER — ASPIRIN EC 325 MG PO TBEC
325.0000 mg | DELAYED_RELEASE_TABLET | Freq: Every day | ORAL | Status: DC
  Administered 2017-09-18 – 2017-09-19 (×2): 325 mg via ORAL
  Filled 2017-09-18 (×3): qty 1

## 2017-09-18 MED ORDER — VANCOMYCIN HCL IN DEXTROSE 1-5 GM/200ML-% IV SOLN
1000.0000 mg | INTRAVENOUS | Status: DC | PRN
  Administered 2017-09-19: 1000 mg via INTRAVENOUS
  Filled 2017-09-18 (×2): qty 200

## 2017-09-18 MED ORDER — SODIUM CHLORIDE 0.9 % IV SOLN
2.0000 g | Freq: Once | INTRAVENOUS | Status: AC
  Administered 2017-09-18: 2 g via INTRAVENOUS
  Filled 2017-09-18: qty 2

## 2017-09-18 MED ORDER — CHLORHEXIDINE GLUCONATE CLOTH 2 % EX PADS
6.0000 | MEDICATED_PAD | Freq: Every day | CUTANEOUS | Status: DC
  Administered 2017-09-19: 6 via TOPICAL

## 2017-09-18 MED ORDER — ONDANSETRON HCL 4 MG/2ML IJ SOLN: 4.0000 mg | Freq: Four times a day (QID) | INTRAMUSCULAR | Status: DC | PRN

## 2017-09-18 MED ORDER — SEVELAMER CARBONATE 800 MG PO TABS
800.0000 mg | ORAL_TABLET | Freq: Three times a day (TID) | ORAL | Status: DC
  Administered 2017-09-18 – 2017-09-19 (×3): 800 mg via ORAL
  Filled 2017-09-18 (×4): qty 1

## 2017-09-18 MED ORDER — SODIUM CHLORIDE 0.9% FLUSH
3.0000 mL | Freq: Two times a day (BID) | INTRAVENOUS | Status: DC
  Administered 2017-09-18 – 2017-09-19 (×3): 3 mL via INTRAVENOUS

## 2017-09-18 MED ORDER — SODIUM CHLORIDE 0.9 % IV SOLN: 250.0000 mL | INTRAVENOUS | Status: DC | PRN

## 2017-09-18 MED ORDER — SODIUM CHLORIDE 0.9% FLUSH: 3.0000 mL | INTRAVENOUS | Status: DC | PRN

## 2017-09-18 NOTE — NC FL2 (Addendum)
[place] LEVEL OF CARE SCREENING TOOL     IDENTIFICATION  Patient Name: [name] Birthdate: September 05, 1955 Sex: male Admission Date (Current Location): 09/18/2017  [place] Number:  [name] ([id number]) Facility and Address:  [facility], [address], [place], [place] [address]      Provider Number: [id number]  Attending Physician Name and Address:  Bettey Costa, MD  Relative Name and Phone Number:       Current Level of Care: Hospital Recommended Level of Care: Assisted Living Facility([facility]) Prior Approval Number:    Date Approved/Denied:   PASRR Number:    Discharge Plan: Domiciliary (Rest home)([facility])    Current Diagnoses: Patient Active Problem List   Diagnosis Date Noted  . Pneumonia 09/18/2017  . Apnea   . End-stage renal disease on hemodialysis ([place])   . Hypervolemia   . Respiratory distress   . Altered mental status   . Acute on chronic systolic CHF (congestive heart failure) ([place]) 01/18/2017  . Respiratory failure with hypoxia ([place]) 11/11/2016  . Acute respiratory failure ([place]) 10/28/2016  . Pulmonary embolism ([place]) 08/03/2016  . NSTEMI (non-ST elevated myocardial infarction) ([place]) 07/26/2016  . Acute respiratory failure with hypoxia ([place]) 05/24/2016  . Right lower lobe pneumonia ([place]) 05/24/2016  . Elevated troponin 05/24/2016  . Pulmonary edema 05/21/2016  . Kidney dialysis as the cause of abnormal reaction of the patient, or of later complication, without mention of misadventure at the time of the procedure (CODE) 03/05/2016  . PVD (peripheral vascular disease) ([place]) 03/05/2016  . Preop examination 05/02/2015  . Chronic systolic heart failure ([place])   . Sepsis ([place]) 01/13/2015  . Acute encephalopathy 01/13/2015  . Hyperkalemia 01/13/2015  . Gangrene of foot ([place]) 05/04/2014  . ESRD (end stage renal disease) on dialysis ([place]) 05/04/2014  . CAD (coronary artery disease) 05/04/2014   . Normocytic anemia 05/04/2014  . Hyperlipidemia 04/26/2010  . HYPERTENSION, BENIGN 04/26/2010  . CAD, NATIVE VESSEL 04/26/2010    Orientation RESPIRATION BLADDER Height & Weight     Self, Time, Situation, Place  Normal Continent Weight: Estate manager/land agent patient) Height:  6\' 2"  (188 cm)  BEHAVIORAL SYMPTOMS/MOOD NEUROLOGICAL BOWEL NUTRITION STATUS      Continent Diet(renal with fluid restriction Fluid restriction: 1200 mL Fluid)  AMBULATORY STATUS COMMUNICATION OF NEEDS Skin   Limited Assist Verbally Normal                       Personal Care Assistance Level of Assistance  Bathing, Feeding, Dressing Bathing Assistance: Limited assistance Feeding assistance: Independent Dressing Assistance: Limited assistance     Functional Limitations Info  Sight, Hearing, Speech Sight Info: Adequate Hearing Info: Adequate Speech Info: Adequate    SPECIAL CARE FACTORS FREQUENCY  PT (By licensed PT), OT (By licensed OT)     PT Frequency: (2-3 Home Health) OT Frequency: (2-3 [place])  [place] for dialysis at 5:30 AM          Contractures      Additional Factors Info  Code Status, Allergies Code Status Info: (Full Code) Allergies Info: (DUST MITE EXTRACT )           Current Medications (09/18/2017):  This is the current hospital active medication list Current Facility-Administered Medications  Medication Dose Route Frequency Provider Last Rate Last Dose  . 0.9 %  sodium chloride infusion  250 mL Intravenous PRN Bettey Costa, MD      .  acetaminophen (TYLENOL) tablet 650 mg  650 mg Oral Q6H PRN Bettey Costa, MD       Or  . acetaminophen (TYLENOL) suppository 650 mg  650 mg Rectal Q6H PRN Mody, Sital, MD      . acetaminophen (TYLENOL) tablet 500 mg  500 mg Oral Q6H PRN Mody, Sital, MD      . albuterol (PROVENTIL) (2.5 MG/3ML) 0.083% nebulizer solution 3 mL  3 mL Inhalation Q6H PRN Mody, Sital, MD      . apixaban (ELIQUIS) tablet 5 mg  5 mg Oral BID Bettey Costa,  MD      . aspirin EC tablet 325 mg  325 mg Oral Daily Mody, Sital, MD      . atorvastatin (LIPITOR) tablet 10 mg  10 mg Oral QHS Bettey Costa, MD      . Derrill Memo ON 09/19/2017] calcium acetate (PHOSLO) capsule 667 mg  667 mg Oral Q breakfast Mody, Sital, MD      . carvedilol (COREG) tablet 3.125 mg  3.125 mg Oral BID WC Mody, Sital, MD      . Derrill Memo ON 09/19/2017] ceFEPIme (MAXIPIME) 1 g in sodium chloride 0.9 % 100 mL IVPB  1 g Intravenous Q24H Hallaji, Sheema M, RPH      . docusate sodium (COLACE) capsule 100 mg  100 mg Oral Daily PRN Mody, Sital, MD      . ipratropium-albuterol (DUONEB) 0.5-2.5 (3) MG/3ML nebulizer solution 3 mL  3 mL Inhalation Q6H PRN Benjie Karvonen, Sital, MD      . isosorbide mononitrate (IMDUR) 24 hr tablet 30 mg  30 mg Oral Daily Mody, Sital, MD      . loratadine (CLARITIN) tablet 10 mg  10 mg Oral Daily Mody, Sital, MD      . midodrine (PROAMATINE) tablet 5 mg  5 mg Oral BID Mody, Sital, MD      . mometasone-formoterol (DULERA) 200-5 MCG/ACT inhaler 2 puff  2 puff Inhalation BID Bettey Costa, MD      . multivitamin with minerals tablet 1 tablet  1 tablet Oral BID Mody, Sital, MD      . nitroGLYCERIN (NITROSTAT) SL tablet 0.4 mg  0.4 mg Sublingual Q5 min PRN Benjie Karvonen, Sital, MD      . ondansetron (ZOFRAN) tablet 4 mg  4 mg Oral Q6H PRN Mody, Sital, MD       Or  . ondansetron (ZOFRAN) injection 4 mg  4 mg Intravenous Q6H PRN Mody, Sital, MD      . pantoprazole (PROTONIX) EC tablet 40 mg  40 mg Oral QAC breakfast Mody, Sital, MD      . patiromer (VELTASSA) packet 8.4 g  8.4 g Oral Daily Mody, Sital, MD      . polyethylene glycol (MIRALAX / GLYCOLAX) packet 17 g  17 g Oral Daily PRN Mody, Sital, MD      . polyethylene glycol (MIRALAX / GLYCOLAX) packet 17 g  17 g Oral Daily PRN Mody, Sital, MD      . pregabalin (LYRICA) capsule 75 mg  75 mg Oral Daily Mody, Sital, MD      . sevelamer carbonate (RENVELA) tablet 800 mg  800 mg Oral TID WC Mody, Sital, MD      . sodium chloride flush (NS) 0.9  % injection 3 mL  3 mL Intravenous Q12H Mody, Sital, MD      . sodium chloride flush (NS) 0.9 % injection 3 mL  3 mL Intravenous PRN Bettey Costa, MD      .  tiotropium (SPIRIVA) inhalation capsule 18 mcg  18 mcg Inhalation Daily Mody, Sital, MD      . traMADol (ULTRAM) tablet 50 mg  50 mg Oral Q6H PRN Bettey Costa, MD      . Derrill Memo ON 09/19/2017] vancomycin (VANCOCIN) IVPB 1000 mg/200 mL premix  1,000 mg Intravenous Q dialysis Pernell Dupre, [facility]         Discharge Medications: Please see discharge summary for a list of discharge medications.  Relevant Imaging Results:  Relevant Lab Results:   Additional Information (SSN: [id number])  Smith Mince, Student-Social Work

## 2017-09-18 NOTE — Plan of Care (Signed)
Pt admitted today post hemodialysis. VSS. Denies pain.

## 2017-09-18 NOTE — ED Triage Notes (Signed)
Pt arrived via EMS from [place] with c/o abdominal pain that began a few hours ago. Pt last dialysis treatment was yesterday and they pulled off 2-3 liters per EMS report. Pt is A&O x4 at this time.

## 2017-09-18 NOTE — ED Notes (Signed)
Pt going to dialysis at this time.

## 2017-09-18 NOTE — ED Provider Notes (Addendum)
[facility] Emergency Department Provider Note  ____________________________________________   First MD Initiated Contact with Patient 09/18/17 [phone]     (approximate)  I have reviewed the triage vital signs and the nursing notes.   HISTORY  Chief Complaint Abdominal Pain  History is limited by the patient being a vague historian  HPI [name] is a 62 y.o. male with extensive and severe chronic medical history including at least 10 years of renal failure on hemodialysis Mondays, Wednesdays, and Fridays.  He presents by EMS from his nursing facility with acute onset and severe upper abdominal pain that radiates both up and down but seems to come from the top of his abdomen.  Nothing particular makes it better or worse and he is obviously in pain when he arrived to the emergency department.  He has had some nausea but no vomiting.  He denies upper chest pain.  He denies shortness of breath.  He denies fever/chills.  He describes the pain is both an aching pain and a sharp pain.  Of note he was seen by me about 2 months ago in respiratory distress and was found to have a small chronic pulmonary embolism and subsequently he was found to have a DVT.  He is maintained on Eliquis chronically.  Past Medical History:  Diagnosis Date  . 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 Amputee, below knee, left ([place])   . Anemia   . Asthma   . Cardiomyopathy ([place])   . CHF (congestive heart failure) ([place])   . Chronic systolic heart failure ([place])   . Complication of anesthesia    hypotension  . COPD (chronic obstructive pulmonary disease) ([place])   . Coronary artery disease   . Dialysis patient ([place])    Mon, Wed, Fri  . End stage renal disease ([place])   . GERD (gastroesophageal reflux disease)   . Headache   . History of kidney stones   . History of pulmonary embolism   . HLD (hyperlipidemia)   . HTN (hypertension)   . Hyperparathyroidism   . Myocardial  infarction ([place])   . Peripheral vascular disease ([place])   . Shortness of breath dyspnea   . Sleep apnea    NO C-PAP, Patient stated in process of  "getting one"   . Tobacco dependence     Patient Active Problem List   Diagnosis Date Noted  . Apnea   . End-stage renal disease on hemodialysis ([place])   . Hypervolemia   . Respiratory distress   . Altered mental status   . Acute on chronic systolic CHF (congestive heart failure) ([place]) 01/18/2017  . Respiratory failure with hypoxia ([place]) 11/11/2016  . Acute respiratory failure ([place]) 10/28/2016  . Pulmonary embolism ([place]) 08/03/2016  . NSTEMI (non-ST elevated myocardial infarction) ([place]) 07/26/2016  . Acute respiratory failure with hypoxia ([place]) 05/24/2016  . Right lower lobe pneumonia ([place]) 05/24/2016  . Elevated troponin 05/24/2016  . Pulmonary edema 05/21/2016  . Kidney dialysis as the cause of abnormal reaction of the patient, or of later complication, without mention of misadventure at the time of the procedure (CODE) 03/05/2016  . PVD (peripheral vascular disease) ([place]) 03/05/2016  . Preop examination 05/02/2015  . Chronic systolic heart failure ([place])   . Sepsis ([place]) 01/13/2015  . Acute encephalopathy 01/13/2015  . Hyperkalemia 01/13/2015  . Gangrene of foot ([place]) 05/04/2014  . ESRD (end stage renal disease) on dialysis ([place]) 05/04/2014  . CAD (coronary artery disease)  05/04/2014  . Normocytic anemia 05/04/2014  . Hyperlipidemia 04/26/2010  . HYPERTENSION, BENIGN 04/26/2010  . CAD, NATIVE VESSEL 04/26/2010    Past Surgical History:  Procedure Laterality Date  . A/V FISTULAGRAM Left 08/14/2017   Procedure: A/V FISTULAGRAM;  Surgeon: Algernon Huxley, MD;  Location: [facility];  Service: Cardiovascular;  Laterality: Left;  . AMPUTATION Left 05/06/2014   Procedure: AMPUTATION BELOW KNEE;  Surgeon: Elam Dutch, MD;  Location: [place];  Service: Vascular;  Laterality: Left;  . AMPUTATION Right 01/12/2015   Procedure:  Foot transmetatarsal amputation;  Surgeon: Algernon Huxley, MD;  Location: [facility];  Service: Vascular;  Laterality: Right;  . APPLICATION OF WOUND VAC Right 03/01/2015   Procedure: Application of Bio-connekt graft and wound vac application to right foot ;  Surgeon: Algernon Huxley, MD;  Location: [facility];  Service: Vascular;  Laterality: Right;  . AV FISTULA PLACEMENT Left   . AV FISTULA PLACEMENT Left 11/28/2016   Procedure: ARTERIOVENOUS (AV) FISTULA CREATION;  Surgeon: Algernon Huxley, MD;  Location: [facility];  Service: Vascular;  Laterality: Left;  . CARDIAC CATHETERIZATION     stent placement   . CORONARY ANGIOPLASTY    . DIALYSIS/PERMA CATHETER INSERTION N/A 07/31/2016   Procedure: Dialysis/Perma Catheter Insertion;  Surgeon: Algernon Huxley, MD;  Location: [place] [facility];  Service: Cardiovascular;  Laterality: N/A;  . DIALYSIS/PERMA CATHETER INSERTION N/A 07/25/2017   Procedure: DIALYSIS/PERMA CATHETER INSERTION and fistulagram;  Surgeon: Algernon Huxley, MD;  Location: [place] [facility];  Service: Cardiovascular;  Laterality: N/A;  . DIALYSIS/PERMA CATHETER REMOVAL N/A 07/22/2017   Procedure: DIALYSIS/PERMA CATHETER REMOVAL;  Surgeon: Katha Cabal, MD;  Location: [place] [facility];  Service: Cardiovascular;  Laterality: N/A;  . IR FLUORO GUIDE CV LINE LEFT  04/10/2017  . LIGATION OF ARTERIOVENOUS  FISTULA Right 01/31/2016   Procedure: LIGATION OF ARTERIOVENOUS  FISTULA;  Surgeon: Algernon Huxley, MD;  Location: [facility];  Service: Vascular;  Laterality: Right;  . PERIPHERAL VASCULAR CATHETERIZATION Right 12/15/2014   Procedure: Lower Extremity Angiography;  Surgeon: Algernon Huxley, MD;  Location: [facility] [place] [facility];  Service: Cardiovascular;  Laterality: Right;  . PERIPHERAL VASCULAR CATHETERIZATION  12/15/2014   Procedure: Lower Extremity Intervention;  Surgeon: Algernon Huxley, MD;  Location: [place] [facility];  Service: Cardiovascular;;  . PERIPHERAL VASCULAR CATHETERIZATION Right 08/14/2015    Procedure: A/V Shuntogram/Fistulagram;  Surgeon: Algernon Huxley, MD;  Location: [name] [facility];  Service: Cardiovascular;  Laterality: Right;  . PERIPHERAL VASCULAR CATHETERIZATION N/A 08/14/2015   Procedure: A/V Shunt Intervention;  Surgeon: Algernon Huxley, MD;  Location: [place] [facility];  Service: Cardiovascular;  Laterality: N/A;  . PERIPHERAL VASCULAR CATHETERIZATION N/A 01/11/2016   Procedure: Dialysis/Perma Catheter Insertion;  Surgeon: Algernon Huxley, MD;  Location: [place] [facility];  Service: Cardiovascular;  Laterality: N/A;  . REVISON OF ARTERIOVENOUS FISTULA Right 02/17/2016   Procedure: removal of AV fistula;  Surgeon: Serafina Mitchell, MD;  Location: [facility];  Service: Vascular;  Laterality: Right;  . REVISON OF ARTERIOVENOUS FISTULA Right 01/31/2016   Procedure: REVISON OF ARTERIOVENOUS FISTULA ( BRACHIOCEPHALIC ) W/ ARTEGRAFT;  Surgeon: Algernon Huxley, MD;  Location: [facility];  Service: Vascular;  Laterality: Right;  . TRANSMETATARSAL AMPUTATION Right 05/04/2015   Procedure: TRANSMETATARSAL AMPUTATION REVISION, great toe amputation;  Surgeon: Algernon Huxley, MD;  Location: [facility];  Service: Vascular;  Laterality: Right;    Prior  to Admission medications   Medication Sig Start Date End Date Taking? Authorizing Provider  acetaminophen (TYLENOL) 500 MG tablet Take 500 mg by mouth every 6 (six) hours as needed for mild pain.    Yes [provider]  albuterol (PROVENTIL HFA;VENTOLIN HFA) 108 (90 Base) MCG/ACT inhaler Inhale 1-2 puffs every 6 (six) hours as needed into the lungs for wheezing or shortness of breath.   Yes [provider]  apixaban (ELIQUIS) 5 MG TABS tablet Take 1 tablet (5 mg total) by mouth 2 (two) times daily. 01/20/17  Yes Gladstone Lighter, MD  aspirin EC 325 MG tablet Take 325 mg by mouth daily.    Yes [provider]  atorvastatin (LIPITOR) 10 MG tablet Take 10 mg by mouth at bedtime.    Yes [provider]  budesonide-formoterol  (SYMBICORT) 160-4.5 MCG/ACT inhaler Inhale 2 puffs into the lungs 2 (two) times daily. Reported on 08/14/2015   Yes [provider]  calcium acetate (PHOSLO) 667 MG capsule Take 667 mg by mouth daily.    Yes [provider]  carvedilol (COREG) 3.125 MG tablet Take 1 tablet (3.125 mg total) by mouth 2 (two) times daily with a meal. 01/20/17  Yes Gladstone Lighter, MD  cetirizine (ZYRTEC) 10 MG tablet Take 10 mg by mouth daily.   Yes [provider]  docusate sodium (COLACE) 100 MG capsule Take 100 mg by mouth daily as needed for mild constipation or moderate constipation.    Yes [provider]  fluticasone (FLONASE) 50 MCG/ACT nasal spray Place 1 spray daily into the nose.    Yes [provider]  guaifenesin (ROBAFEN) 100 MG/5ML syrup Take 200 mg by mouth 3 (three) times daily as needed for cough.   Yes [provider]  Ipratropium-Albuterol (COMBIVENT RESPIMAT) 20-100 MCG/ACT AERS respimat Inhale 1 puff into the lungs every 6 (six) hours as needed for wheezing or shortness of breath.   Yes [provider]  isosorbide mononitrate (IMDUR) 30 MG 24 hr tablet Take 30 mg by mouth daily.  08/22/16  Yes [provider]  midodrine (PROAMATINE) 10 MG tablet Take 5 mg by mouth 2 (two) times daily.   Yes [provider]  Multiple Vitamin (MULTIVITAMIN WITH MINERALS) TABS tablet Take 1 tablet by mouth 2 (two) times daily.   Yes [provider]  nitroGLYCERIN (NITROSTAT) 0.4 MG SL tablet Place 0.4 mg under the tongue every 5 (five) minutes as needed for chest pain.   Yes [provider]  pantoprazole (PROTONIX) 20 MG tablet Take 20 mg by mouth daily.   Yes [provider]  patiromer (VELTASSA) 8.4 g packet Take 1 packet (8.4 g total) by mouth daily. 07/26/17  Yes Mody, Ulice Bold, MD  polyethylene glycol (MIRALAX / GLYCOLAX) packet Take 17 g by mouth daily as needed for mild constipation.    Yes [provider]  pregabalin (LYRICA) 75 MG capsule Take 1 capsule (75 mg total) by mouth daily. 01/22/17  Yes Gladstone Lighter, MD  sevelamer carbonate (RENVELA) 800 MG tablet Take 800 mg by mouth 3 (three) times daily with meals.    Yes [provider]  tiotropium (SPIRIVA) 18 MCG inhalation capsule Place 18 mcg into inhaler and inhale daily.   Yes [provider]  predniSONE (DELTASONE) 50 MG tablet Take 1 tablet daily for 3 days then stop Patient not taking: Reported on 08/14/2017 07/26/17   Bettey Costa, MD    Allergies Dust mite extract  Family History  Problem Relation Age of Onset  . Leukemia Mother   . Heart attack Father   . Heart failure Other   . Hypertension Other   . Leukemia Other   . Diabetes Other   . Prostate cancer Neg Hx   . Kidney cancer Neg Hx   . Bladder Cancer Neg Hx     Social History Social History   Tobacco Use  . Smoking status: Light Tobacco Smoker    Packs/day: 0.25    Years: 30.00    Pack years: 7.50    Types: Cigarettes  . Smokeless tobacco: Never Used  . Tobacco comment: 5  Substance Use Topics  . Alcohol use: No    Alcohol/week: 0.0 oz  . Drug use: No    Comment: has used crack cocaine in past     Review of Systems Constitutional: No fever/chills Eyes: No visual changes. ENT: No sore throat. Cardiovascular: Denies chest pain. Respiratory: Denies shortness of breath. Gastrointestinal: Acute onset severe abdominal pain as described above Genitourinary: Negative for dysuria. Musculoskeletal: Negative for neck pain.  Negative for back pain. Integumentary: Negative for rash. Neurological: Negative for headaches, focal weakness or numbness.   ____________________________________________   PHYSICAL EXAM:  VITAL SIGNS: ED Triage Vitals  Enc Vitals Group     BP 09/18/17 0346 97/73     Pulse Rate 09/18/17 0346 65     Resp 09/18/17 0346 (!) 22     Temp 09/18/17 0346 98 F (36.7 C)     Temp Source 09/18/17 0346 Oral     SpO2  09/18/17 0346 97 %     Weight 09/18/17 0347 95.3 kg (210 lb)     Height 09/18/17 0347 1.88 m (6\' 2" )     Head Circumference --      Peak Flow --      Pain Score 09/18/17 0347 10     Pain Loc --      Pain Edu? --      Excl. in [place]? --     Constitutional: Alert and oriented.  Appears chronically ill and is in acute distress from his abdominal pain Eyes: Conjunctivae are normal.  Head: Atraumatic. Nose: No congestion/rhinnorhea. Mouth/Throat: Mucous membranes are moist. Neck: No stridor.  No meningeal signs.   Cardiovascular: Normal rate, regular rhythm. Good peripheral circulation. Grossly normal heart sounds.  He reports that his dialysis access is in the right leg. Respiratory: Normal respiratory effort.  No retractions. Lungs CTAB. Gastrointestinal: Abdomen appears to be mildly distended although the patient states it is normal for him.  He has tenderness all throughout the abdomen but it is worse in the upper middle part of his abdomen.  He has rebound and guarding but the guarding seems to be voluntary.  Tenderness is severe. Musculoskeletal: s/p BKA on left side.  No edema on RLL. Neurologic:  Normal speech and language. No gross focal neurologic deficits are appreciated.  Skin:  Skin is warm, dry and intact. No rash noted.   ____________________________________________   LABS (all labs ordered are listed, but only abnormal results are displayed)  Labs Reviewed  CBC WITH DIFFERENTIAL/PLATELET - Abnormal; Notable for the following components:      Result Value   RBC 4.05 (*)    Hemoglobin 12.6 (*)    HCT 38.9 (*)    RDW 20.3 (*)    Monocytes Absolute 1.1 (*)    All other components within normal limits  COMPREHENSIVE METABOLIC PANEL - Abnormal; Notable for the following components:  Glucose, Bld 119 (*)    BUN 53 (*)    Creatinine, Ser 7.48 (*)    Albumin 2.9 (*)    AST 135 (*)    Alkaline Phosphatase 152 (*)    GFR calc non Af Amer 7 (*)    GFR calc Af Amer 8 (*)      All other components within normal limits  TROPONIN I - Abnormal; Notable for the following components:   Troponin I 0.16 (*)    All other components within normal limits  BRAIN NATRIURETIC PEPTIDE - Abnormal; Notable for the following components:   B Natriuretic Peptide 754.0 (*)    All other components within normal limits  MAGNESIUM - Abnormal; Notable for the following components:   Magnesium 2.5 (*)    All other components within normal limits  CULTURE, BLOOD (ROUTINE X 2)  CULTURE, BLOOD (ROUTINE X 2)  MRSA PCR SCREENING  LACTIC ACID, PLASMA   ____________________________________________  EKG  ED ECG REPORT I, Hinda Kehr, the attending physician, personally viewed and interpreted this ECG.  Date: 09/18/2017 EKG Time: 3:47 AM Rate: 66 Rhythm: normal sinus rhythm QRS Axis: normal Intervals: Left bundle branch block ST/T Wave abnormalities: Non-specific ST segment / T-wave changes, but no evidence of acute ischemia. Narrative Interpretation: no evidence of acute ischemia.  EKG appears similar to prior in terms of T wave inversions in leads V5 and V6 and general overall morphology (compared to July 21, 2017 EKG)   ____________________________________________  RADIOLOGY   ED MD interpretation: No indication of dissection nor aneurysm.  No indication of significant pulmonary embolus.  He has a small right pleural effusion with a right lower lobe consolidation and likely represents pneumonia.  Official radiology report(s): Ct Angio Chest/abd/pel For Dissection W And/or W/wo  Result Date: 09/18/2017 CLINICAL DATA:  Abdominal pain beginning a few hours ago. Dialysis treatment yesterday. Acute upper and central abdominal pain. EXAM: CT ANGIOGRAPHY CHEST, ABDOMEN AND PELVIS TECHNIQUE: Multidetector CT imaging through the chest, abdomen and pelvis was performed using the standard protocol during bolus administration of intravenous contrast. Multiplanar reconstructed  images and MIPs were obtained and reviewed to evaluate the vascular anatomy. CONTRAST:  180mL ISOVUE-370 IOPAMIDOL (ISOVUE-370) INJECTION 76% COMPARISON:  CT chest 07/21/2017. FINDINGS: CTA CHEST FINDINGS Cardiovascular: Noncontrast images of the chest demonstrate no evidence of intramural hematoma. Calcification of the aorta with prominent calcification of the coronary arteries. Catheter tip demonstrated in the inferior vena cava at the inferior vena cavoatrial junction. Images obtained during arterial phase after contrast administration demonstrate normal caliber thoracic aorta. No aortic dissection. Central pulmonary arteries are well opacified. No evidence of significant pulmonary embolus. Cardiac enlargement. No pericardial effusion. Mediastinum/Nodes: No enlarged mediastinal, hilar, or axillary lymph nodes. Thyroid gland, trachea, and esophagus demonstrate no significant findings. Lungs/Pleura: Small right pleural effusion. Consolidation in the right lower lung. This likely represents pneumonia. Dependent atelectasis also in the lung bases. Airways are patent. No pneumothorax. Musculoskeletal: Degenerative changes in the spine. No destructive bone lesions. Review of the MIP images confirms the above findings. CTA ABDOMEN AND PELVIS FINDINGS VASCULAR Aorta: Normal caliber aorta without aneurysm, dissection, vasculitis or significant stenosis. Diffuse aortic calcification. Celiac: Patent without evidence of aneurysm, dissection, vasculitis or significant stenosis. SMA: Patent without evidence of aneurysm, dissection, vasculitis or significant stenosis. Renals: Prominent calcification at the origin of the renal arteries. Diminished flow to both renal arteries although both arteries remain patent. This is likely to represent proximal renal artery stenosis bilaterally. Nephrograms are symmetrical. IMA:  Patent without evidence of aneurysm, dissection, vasculitis or significant stenosis. Inflow: Patent without  evidence of aneurysm, dissection, vasculitis or significant stenosis. Veins: There is a right femoral vein catheter with tip in the inferior vena caval atrial junction. Review of the MIP images confirms the above findings. NON-VASCULAR Hepatobiliary: No focal liver abnormality is seen. No gallstones, gallbladder wall thickening, or biliary dilatation. Pancreas: Unremarkable. No pancreatic ductal dilatation or surrounding inflammatory changes. Spleen: Normal in size without focal abnormality. Adrenals/Urinary Tract: No adrenal gland nodules. Bilateral renal parenchymal atrophy with diminished nephrograms consistent with chronic renal disease, likely vascular. No hydronephrosis or hydroureter. Bladder is decompressed. Stomach/Bowel: Stomach is within normal limits. Appendix appears normal. No evidence of bowel wall thickening, distention, or inflammatory changes. Lymphatic: No significant lymphadenopathy. Reproductive: Prostate is unremarkable. Other: No abdominal wall hernia or abnormality. No abdominopelvic ascites. Musculoskeletal: Degenerative changes in the spine. No destructive bone lesions. Review of the MIP images confirms the above findings. IMPRESSION: 1. No evidence of aneurysm or dissection involving the thoracic or abdominal aorta. No evidence of significant pulmonary embolus. 2. Extensive aortic and branch vessel calcifications. Coronary artery calcifications. 3. Bilateral renal atrophy with probable bilateral renal artery stenosis. 4. Small right pleural effusion with consolidation in the right lower lung likely representing pneumonia. Electronically Signed   By: Lucienne Capers M.D.   On: 09/18/2017 05:23    ____________________________________________   PROCEDURES  Critical Care performed: No   Procedure(s) performed:   Procedures   ____________________________________________   INITIAL IMPRESSION / ASSESSMENT AND PLAN / ED COURSE  As part of my medical decision making, I  reviewed the following data within the [place] notes reviewed and incorporated, Labs reviewed , EKG interpreted , Old EKG reviewed, Old chart reviewed, Discussed with admitting physician , A phone consult was requested and obtained from this/these consultant(s) (nephrology, Dr. Juleen China) and Notes from prior ED visits    Differential diagnosis includes, but is not limited to, complications from his dialysis, venous thrombosis given his history of PE and DVT, acute bleeding given his history of Eliquis, aortic dissection, infectious process.  He is in a great deal of pain upon arrival and even though he is a dialysis patient I think I need to scan him emergently to rule out aortic dissection.  He does not appear septic and his history is not consistent with an infectious process.  He does not appear volume overloaded and just had dialysis yesterday.  I am giving morphine 4 mg IV and Zofran 4 mg IV and a small fluid bolus of 500 mL and then we will check a CT angiogram chest/abdomen/pelvis to rule out dissection.  Clinical Course as of Sep 18 720  Thu Sep 18, 2017  0506 Patient has chronically elevated troponin according to the medical records [facility]).  Troponin I(!!): 0.16 [CF]  0506 B Natriuretic Peptide(!): 754.0 [CF]  0515 CBC is reassuring with no leukocytosis.  Metabolic panel is essentially normal for him with creatinine of 7.48 but that is to be expected.  Magnesium is elevated at 2.5 likely as result of his recent dialysis.   [CF]  7628 Lactic Acid, Venous: 1.3 [CF]  0611 No acute abnormality in abdomen, but RLL infiltrate in lungs consistent with pneumonia.  Will discuss with nephrology.   CT Angio Chest/Abd/Pel for Dissection W and/or W/WO [CF]  0617 I spoke by phone with Dr. Juleen China who knows the patient well.  We discussed the case in detail.  He took a look  at the CT scan.  His recommendation was to go ahead and admit for IV antibiotics given all his  comorbidities and the chance that the small pleural effusion and right lower lobe pneumonia could turn into something more severe if inadequately treated.  Additionally, he will likely dialyze the patient later today given the IV contrast load which was appropriate understandable under the circumstances but still should be dialyzed.  They will also cycle troponins.  I will discuss the case with the hospitalist who will pass along to the day team.  I also updated the patient who understands and agrees with the plan.   [CF]  4765 Discussed case by phone with Dr. Jodell Cipro with the hospitalist service.  He will pass along to the daytime team the plan of care.   [CF]    Clinical Course User Index [CF] Hinda Kehr, MD    ____________________________________________  FINAL CLINICAL IMPRESSION(S) / ED DIAGNOSES  Final diagnoses:  HCAP (healthcare-associated pneumonia)  Pneumonia of right lower lobe due to infectious organism [facility])  Pleural effusion, right  ESRD on hemodialysis ([place])  Generalized abdominal pain     MEDICATIONS GIVEN DURING THIS VISIT:  Medications  vancomycin (VANCOCIN) IVPB 1000 mg/200 mL premix (1,000 mg Intravenous New Bag/Given 09/18/17 0641)  vancomycin (VANCOCIN) 1,250 mg in sodium chloride 0.9 % 250 mL IVPB (has no administration in time range)  ceFEPIme (MAXIPIME) 2 g in sodium chloride 0.9 % 100 mL IVPB (has no administration in time range)  morphine 4 MG/ML injection 4 mg (4 mg Intravenous Given 09/18/17 0422)  ondansetron (ZOFRAN) injection 4 mg (4 mg Intravenous Given 09/18/17 0422)  sodium chloride 0.9 % bolus 500 mL (0 mLs Intravenous Stopped 09/18/17 0523)  iopamidol (ISOVUE-370) 76 % injection 125 mL (125 mLs Intravenous Contrast Given 09/18/17 0448)  ceFEPIme (MAXIPIME) 2 g in sodium chloride 0.9 % 100 mL IVPB (2 g Intravenous New Bag/Given 09/18/17 4650)     ED Discharge Orders    None       Note:  This document was prepared using Dragon voice  recognition software and may include unintentional dictation errors.    Hinda Kehr, MD 09/18/17 3546    Hinda Kehr, MD 09/18/17 [phone]

## 2017-09-18 NOTE — Progress Notes (Signed)
[facility]  ROUNDING NOTE   Subjective:   [name] admitted to [facility] on 09/18/2017 for Generalized abdominal pain [R10.84] Pleural effusion, right [J90] ESRD on hemodialysis ([facility]) [N18.6, Z99.2] HCAP (healthcare-associated pneumonia) [J18.9] Pneumonia of right lower lobe due to infectious organism [facility]) [J18.1]  Patient was having severe abdominal pain radiating to his back. Concern for AAA. CT angiogram done and no vascular intervention required. However patient found to have pneumonia.   Hemodialysis treatment for today due to high IV contrast load.     HEMODIALYSIS FLOWSHEET:  Blood Flow Rate (mL/min): 325 mL/min Arterial Pressure (mmHg): -250 mmHg Venous Pressure (mmHg): 220 mmHg Transmembrane Pressure (mmHg): 80 mmHg Ultrafiltration Rate (mL/min): 0.83 mL/min Dialysate Flow Rate (mL/min): 600 ml/min Conductivity: Machine : 14.1 Conductivity: Machine : 14.1 Dialysis Fluid Bolus: Normal Saline Bolus Amount (mL): (3k2.5ca) Dialysate Change: Other (comment)(3k2.5ca)    Objective:  Vital signs in last 24 hours:  Temp:  [98 F (36.7 C)-98.2 F (36.8 C)] 98.2 F (36.8 C) (04/25 0930) Pulse Rate:  [60-80] 69 (04/25 1204) Resp:  [7-22] 12 (04/25 1204) BP: (97-128)/(66-96) 101/73 (04/25 1204) SpO2:  [92 %-97 %] 94 % (04/25 0930) Weight:  [95.3 kg (210 lb)] 95.3 kg (210 lb) (04/25 0347)  Weight change:  Filed Weights   09/18/17 0347  Weight: 95.3 kg (210 lb)    Intake/Output: No intake/output data recorded.   Intake/Output this shift:  No intake/output data recorded.  Physical Exam: General: NAD, laying in bed  Head: Normocephalic, atraumatic. Moist oral mucosal membranes  Eyes: Anicteric, PERRL  Neck: Supple, trachea midline  Lungs:  Clear to auscultation  Heart: Regular rate and rhythm  Abdomen:  + distended   Extremities: no peripheral edema.  Neurologic: Nonfocal, moving all four extremities  Skin: No lesions  Access: Right  femoral permcath    Basic Metabolic Panel: Recent Labs  Lab 09/18/17 0349  NA 140  K 4.9  CL 103  CO2 28  GLUCOSE 119*  BUN 53*  CREATININE 7.48*  CALCIUM 9.0  MG 2.5*    Liver Function Tests: Recent Labs  Lab 09/18/17 0349  AST 135*  ALT 49  ALKPHOS 152*  BILITOT 1.2  PROT 7.3  ALBUMIN 2.9*   No results for input(s): LIPASE, AMYLASE in the last 168 hours. No results for input(s): AMMONIA in the last 168 hours.  CBC: Recent Labs  Lab 09/18/17 0349  WBC 5.6  NEUTROABS 2.8  HGB 12.6*  HCT 38.9*  MCV 96.0  PLT 152    Cardiac Enzymes: Recent Labs  Lab 09/18/17 0349  TROPONINI 0.16*    BNP: Invalid input(s): POCBNP  CBG: No results for input(s): GLUCAP in the last 168 hours.  Microbiology: Results for orders placed or performed during the hospital encounter of 07/21/17  Blood culture (routine x 2)     Status: Abnormal   Collection Time: 07/21/17  9:13 AM  Result Value Ref Range Status   Specimen Description   Final    BLOOD RIGHT ARM Performed at [facility], [address] [place], [place] [address]    Special Requests   Final    BOTTLES DRAWN AEROBIC AND ANAEROBIC Blood Culture adequate volume Performed at [facility], [address], [place], [place] [address]    Culture  Setup Time   Final    GRAM POSITIVE COCCI IN BOTH AEROBIC AND ANAEROBIC BOTTLES CRITICAL RESULT CALLED TO, READ BACK BY AND VERIFIED WITH: DAVID BESANTI ON 07/22/17 AT  0012 JAG GRAM STAIN REVIEWED-AGREE WITH RESULT Performed at [facility], [place] [address], [place], [place] [address]    Culture STAPHYLOCOCCUS AUREUS (A)  Final   Report Status 07/24/2017 FINAL  Final   Organism ID, Bacteria STAPHYLOCOCCUS AUREUS  Final      Susceptibility   Staphylococcus aureus - MIC*    CIPROFLOXACIN <=0.5 SENSITIVE Sensitive     ERYTHROMYCIN <=0.25 SENSITIVE Sensitive     GENTAMICIN <=0.5 SENSITIVE Sensitive     OXACILLIN 0.5 SENSITIVE Sensitive      TETRACYCLINE <=1 SENSITIVE Sensitive     VANCOMYCIN <=0.5 SENSITIVE Sensitive     TRIMETH/SULFA <=10 SENSITIVE Sensitive     CLINDAMYCIN <=0.25 SENSITIVE Sensitive     RIFAMPIN <=0.5 SENSITIVE Sensitive     Inducible Clindamycin NEGATIVE Sensitive     * STAPHYLOCOCCUS AUREUS  Blood culture (routine x 2)     Status: Abnormal   Collection Time: 07/21/17  9:13 AM  Result Value Ref Range Status   Specimen Description   Final    BLOOD RIGHT [facility]  Performed at [facility], [address], [place], [place] [address]    Special Requests   Final    BOTTLES DRAWN AEROBIC AND ANAEROBIC Blood Culture adequate volume Performed at [facility], [address] [place], [place] [address]    Culture  Setup Time   Final    GRAM POSITIVE COCCI IN BOTH AEROBIC AND ANAEROBIC BOTTLES CRITICAL VALUE NOTED.  VALUE IS CONSISTENT WITH PREVIOUSLY REPORTED AND CALLED VALUE. JAG GRAM STAIN REVIEWED-AGREE WITH RESULT    Culture (A)  Final    STAPHYLOCOCCUS AUREUS SUSCEPTIBILITIES PERFORMED ON PREVIOUS CULTURE WITHIN THE LAST 5 DAYS. Performed at [facility], [place] [address] [place], [place] [address]    Report Status 07/24/2017 FINAL  Final  Blood Culture ID Panel (Reflexed)     Status: Abnormal   Collection Time: 07/21/17  9:13 AM  Result Value Ref Range Status   Enterococcus species NOT DETECTED NOT DETECTED Final   Listeria monocytogenes NOT DETECTED NOT DETECTED Final   Staphylococcus species DETECTED (A) NOT DETECTED Final    Comment: CRITICAL RESULT CALLED TO, READ BACK BY AND VERIFIED WITH: DAVID BESANTI ON 07/22/17 AT 0012 BY JAG    Staphylococcus aureus DETECTED (A) NOT DETECTED Final    Comment: Methicillin (oxacillin) susceptible Staphylococcus aureus (MSSA). Preferred therapy is anti staphylococcal beta lactam antibiotic (Cefazolin or Nafcillin), unless clinically contraindicated. CRITICAL RESULT CALLED TO, READ BACK BY AND VERIFIED WITH: DAVID BESANTI ON 07/22/17  AT 0012 BY JAG    Methicillin resistance NOT DETECTED NOT DETECTED Final   Streptococcus species NOT DETECTED NOT DETECTED Final   Streptococcus agalactiae NOT DETECTED NOT DETECTED Final   Streptococcus pneumoniae NOT DETECTED NOT DETECTED Final   Streptococcus pyogenes NOT DETECTED NOT DETECTED Final   Acinetobacter baumannii NOT DETECTED NOT DETECTED Final   Enterobacteriaceae species NOT DETECTED NOT DETECTED Final   Enterobacter cloacae complex NOT DETECTED NOT DETECTED Final   Escherichia coli NOT DETECTED NOT DETECTED Final   Klebsiella oxytoca NOT DETECTED NOT DETECTED Final   Klebsiella pneumoniae NOT DETECTED NOT DETECTED Final   Proteus species NOT DETECTED NOT DETECTED Final   Serratia marcescens NOT DETECTED NOT DETECTED Final   Haemophilus influenzae NOT DETECTED NOT DETECTED Final   Neisseria meningitidis NOT DETECTED NOT DETECTED Final   Pseudomonas aeruginosa NOT DETECTED NOT DETECTED Final   Candida albicans NOT DETECTED NOT DETECTED Final   Candida  glabrata NOT DETECTED NOT DETECTED Final   Candida krusei NOT DETECTED NOT DETECTED Final   Candida parapsilosis NOT DETECTED NOT DETECTED Final   Candida tropicalis NOT DETECTED NOT DETECTED Final    Comment: Performed at [facility], [address] [place], [place] [address]  Culture, blood (Routine X 2) w Reflex to ID Panel     Status: None   Collection Time: 07/22/17  8:01 AM  Result Value Ref Range Status   Specimen Description BLOOD BLOOD RIGHT HAND  Final   Special Requests   Final    BOTTLES DRAWN AEROBIC AND ANAEROBIC Blood Culture adequate volume   Culture   Final    NO GROWTH 5 DAYS Performed at [facility], [address], [place], [place] [address]    Report Status 07/27/2017 FINAL  Final  Culture, blood (Routine X 2) w Reflex to ID Panel     Status: None   Collection Time: 07/22/17  8:01 AM  Result Value Ref Range Status   Specimen Description BLOOD RIGHT ANTECUBITAL  Final    Special Requests   Final    BOTTLES DRAWN AEROBIC AND ANAEROBIC Blood Culture adequate volume   Culture   Final    NO GROWTH 5 DAYS Performed at [facility], [address] [place], [place] [address]    Report Status 07/27/2017 FINAL  Final  Culture, blood (Routine X 2) w Reflex to ID Panel     Status: None   Collection Time: 07/24/17 12:35 AM  Result Value Ref Range Status   Specimen Description BLOOD RT FOA  Final   Special Requests   Final    BOTTLES DRAWN AEROBIC AND ANAEROBIC Blood Culture adequate volume   Culture   Final    NO GROWTH 5 DAYS Performed at [facility], [address] [place], [place] [address]    Report Status 07/29/2017 FINAL  Final  Culture, blood (Routine X 2) w Reflex to ID Panel     Status: None   Collection Time: 07/24/17 12:48 AM  Result Value Ref Range Status   Specimen Description BLOOD RIGHT HAND  Final   Special Requests   Final    BOTTLES DRAWN AEROBIC AND ANAEROBIC Blood Culture adequate volume   Culture   Final    NO GROWTH 5 DAYS Performed at [facility], [address] [place], [place] [address]    Report Status 07/29/2017 FINAL  Final  MRSA PCR Screening     Status: Abnormal   Collection Time: 07/24/17  7:44 PM  Result Value Ref Range Status   MRSA by PCR POSITIVE (A) NEGATIVE Final    Comment:        The GeneXpert MRSA Assay (FDA approved for NASAL specimens only), is one component of a comprehensive MRSA colonization surveillance program. It is not intended to diagnose MRSA infection nor to guide or monitor treatment for MRSA infections. RESULT CALLED TO, READ BACK BY AND VERIFIED WITH: LEDIA BOATENG ON 07/24/17 AT 2143 BY JAG Performed at [facility], [address] [place], [place] [address]     Coagulation Studies: No results for input(s): LABPROT, INR in the last 72 hours.  Urinalysis: No results for input(s): COLORURINE, LABSPEC, PHURINE, GLUCOSEU, HGBUR, BILIRUBINUR,  KETONESUR, PROTEINUR, UROBILINOGEN, NITRITE, LEUKOCYTESUR in the last 72 hours.  Invalid input(s): APPERANCEUR    Imaging: Ct Angio Chest/abd/pel For Dissection W And/or W/wo  Result Date: 09/18/2017 CLINICAL DATA:  Abdominal pain beginning a few hours  ago. Dialysis treatment yesterday. Acute upper and central abdominal pain. EXAM: CT ANGIOGRAPHY CHEST, ABDOMEN AND PELVIS TECHNIQUE: Multidetector CT imaging through the chest, abdomen and pelvis was performed using the standard protocol during bolus administration of intravenous contrast. Multiplanar reconstructed images and MIPs were obtained and reviewed to evaluate the vascular anatomy. CONTRAST:  177mL ISOVUE-370 IOPAMIDOL (ISOVUE-370) INJECTION 76% COMPARISON:  CT chest 07/21/2017. FINDINGS: CTA CHEST FINDINGS Cardiovascular: Noncontrast images of the chest demonstrate no evidence of intramural hematoma. Calcification of the aorta with prominent calcification of the coronary arteries. Catheter tip demonstrated in the inferior vena cava at the inferior vena cavoatrial junction. Images obtained during arterial phase after contrast administration demonstrate normal caliber thoracic aorta. No aortic dissection. Central pulmonary arteries are well opacified. No evidence of significant pulmonary embolus. Cardiac enlargement. No pericardial effusion. Mediastinum/Nodes: No enlarged mediastinal, hilar, or axillary lymph nodes. Thyroid gland, trachea, and esophagus demonstrate no significant findings. Lungs/Pleura: Small right pleural effusion. Consolidation in the right lower lung. This likely represents pneumonia. Dependent atelectasis also in the lung bases. Airways are patent. No pneumothorax. Musculoskeletal: Degenerative changes in the spine. No destructive bone lesions. Review of the MIP images confirms the above findings. CTA ABDOMEN AND PELVIS FINDINGS VASCULAR Aorta: Normal caliber aorta without aneurysm, dissection, vasculitis or significant stenosis.  Diffuse aortic calcification. Celiac: Patent without evidence of aneurysm, dissection, vasculitis or significant stenosis. SMA: Patent without evidence of aneurysm, dissection, vasculitis or significant stenosis. Renals: Prominent calcification at the origin of the renal arteries. Diminished flow to both renal arteries although both arteries remain patent. This is likely to represent proximal renal artery stenosis bilaterally. Nephrograms are symmetrical. IMA: Patent without evidence of aneurysm, dissection, vasculitis or significant stenosis. Inflow: Patent without evidence of aneurysm, dissection, vasculitis or significant stenosis. Veins: There is a right femoral vein catheter with tip in the inferior vena caval atrial junction. Review of the MIP images confirms the above findings. NON-VASCULAR Hepatobiliary: No focal liver abnormality is seen. No gallstones, gallbladder wall thickening, or biliary dilatation. Pancreas: Unremarkable. No pancreatic ductal dilatation or surrounding inflammatory changes. Spleen: Normal in size without focal abnormality. Adrenals/Urinary Tract: No adrenal gland nodules. Bilateral renal parenchymal atrophy with diminished nephrograms consistent with chronic renal disease, likely vascular. No hydronephrosis or hydroureter. Bladder is decompressed. Stomach/Bowel: Stomach is within normal limits. Appendix appears normal. No evidence of bowel wall thickening, distention, or inflammatory changes. Lymphatic: No significant lymphadenopathy. Reproductive: Prostate is unremarkable. Other: No abdominal wall hernia or abnormality. No abdominopelvic ascites. Musculoskeletal: Degenerative changes in the spine. No destructive bone lesions. Review of the MIP images confirms the above findings. IMPRESSION: 1. No evidence of aneurysm or dissection involving the thoracic or abdominal aorta. No evidence of significant pulmonary embolus. 2. Extensive aortic and branch vessel calcifications. Coronary  artery calcifications. 3. Bilateral renal atrophy with probable bilateral renal artery stenosis. 4. Small right pleural effusion with consolidation in the right lower lung likely representing pneumonia. Electronically Signed   By: Lucienne Capers M.D.   On: 09/18/2017 05:23     Medications:   . [START ON 09/19/2017] ceFEPime (MAXIPIME) IV        Assessment/ Plan:  [name] is a 62 y.o. black male with ESRD on hemodialysis, hypertension, hyperlipidemia, anemia, COPD, peripheral vascular disease, left BKA, right toe amputations  MWF [facility].   1. End stage renal disease: requiring an extra treatment due to IV contrast load.  Tolerating treatment well.  Resume MWF schedule.   2. Anemia  of chronic kidney disease:  hemoglobin 12.6 - EPO as outpatient.   3. Hypotension:  - midodrine before dialysis.   4. Secondary Hyperparathyroidism: - calcium acetateand sevelamerwith meals.   LOS: 0 Lalla Laham 4/25/20191:26 PM

## 2017-09-18 NOTE — ED Notes (Signed)
Dialysis informed that pt has been assigned a bed and report has been called.

## 2017-09-18 NOTE — ED Notes (Signed)
2 unsuccessful PIV attempts by this RN.

## 2017-09-18 NOTE — Clinical Social Work Note (Addendum)
Clinical Social Work Assessment  Patient Details  Name: [name] MRN: [id number] Date of Birth: 06-23-1955  Date of referral:  09/18/17               Reason for consult:  Facility Placement, Discharge Planning                Permission sought to share information with:  Chartered certified accountant granted to share information::  Yes, Verbal Permission Granted  Name::      [facility]   Agency::     Relationship::     Contact Information:     Housing/Transportation Living arrangements for the past 2 months:  [place] of Information:  Patient Patient Interpreter Needed:    Criminal Activity/Legal Involvement Pertinent to Current Situation/Hospitalization:  No - Comment as needed Significant Relationships:  Siblings Lives with:  Facility Resident Do you feel safe going back to the place where you live?  Yes Need for family participation in patient care:  Yes (Comment)  Care giving concerns: Patient has been a resident at [facility] for approximately 6 months (November 2018) (fax: [phone]).    Social Worker assessment / plan: Holiday representative ([place]) received ALF consult. Patient has been a resident at [facility] for about 6 months. Social work Theatre manager met with patient alone at bedside. Patient was sitting up in bed alert and oriented x4. Social work Theatre manager introduced self and explained the role of the [place]. Patient shared he has been at [facility] for about 6 months and brother [name] [phone]) is his primary contact. Patient shared he uses a wheelchair at baseline. Patient also shared he receives dialysis treatment at [facility]) on Monday, Wednesday, Friday at 5:30am and uses the [facility] for transportation. Patient shared he is agreeable to return to [facility]. FL2 completed. CSW and social work Theatre manager will continue to follow and assist.   [place] contacted Paediatric nurse at [facility] and  made her aware of above. Per [employer] patient uses a wheel chair at baseline and has a prosthetic. Per [employer] he is on PRN oxygen through [facility]. Per [employer] patient can return to [facility].   Employment status:  Retired Forensic scientist:  Information systems manager, Medicaid [place] PT Recommendations:  Not assessed at this time [place] / Referral to community resources:     Patient/Family's Response to care: Patient is agreeable to return to the [facility].  Patient/Family's Understanding of and Emotional Response to Diagnosis, Current Treatment, and Prognosis: Patient was pleasant and thanked social work Theatre manager for her assistance.  Emotional Assessment Appearance:  Appears stated age Attitude/Demeanor/Rapport:    Affect (typically observed):  Accepting, Calm, Pleasant Orientation:  Oriented to Self, Oriented to Place, Oriented to  Time, Oriented to Situation Alcohol / Substance use:  Not Applicable Psych involvement (Current and /or in the community):  No (Comment)  Discharge Needs  Concerns to be addressed:  Discharge Planning Concerns, Care Coordination Readmission within the last 30 days:  No Current discharge risk:  Dependent with Mobility Barriers to Discharge:  Continued Medical Work up   [name], Student-Social Work 09/18/2017, 2:53 PM

## 2017-09-18 NOTE — Progress Notes (Signed)
Pharmacy Antibiotic Note  [name] is a 62 y.o. male admitted on 09/18/2017 with sepsis.  Pharmacy has been consulted for vanc/cefepime dosing.  Plan: Patient received vanc 1g and cefepime 2g IV x 1  Will continue vanc 1.25g IV q48h w/ 6 hour stack Will draw vanc trough 05/01 @ 1200 prior to 4th dose. Will continue cefepime 2g IV daily  Ke 0.0141 T1/2 48 hrs Goal trough 15 - 20 mcg/mL  Height: 6\' 2"  (188 cm) Weight: 210 lb (95.3 kg) IBW/kg (Calculated) : 82.2  Temp (24hrs), Avg:98 F (36.7 C), Min:98 F (36.7 C), Max:98 F (36.7 C)  Recent Labs  Lab 09/18/17 0349  WBC 5.6  CREATININE 7.48*  LATICACIDVEN 1.3    Estimated Creatinine Clearance: 11.8 mL/min (A) (by C-G formula based on SCr of 7.48 mg/dL (H)).    Allergies  Allergen Reactions  . Dust Mite Extract Other (See Comments)    Reaction: unknown   Thank you for allowing pharmacy to be a part of this patient's care.  Tobie Lords, PharmD, BCPS Clinical Pharmacist 09/18/2017

## 2017-09-18 NOTE — Progress Notes (Signed)
Pharmacy Antibiotic Note  [name] is a 62 y.o. male admitted on 09/18/2017 with sepsis.  Pharmacy has been consulted for vanc/cefepime dosing.Patient has ESRD and receives HD MWF.   Plan: Patient received Vancomycin 1g IV x 1 dose in ED and then went for HD treatment. MRSA PCR pending.   Will order Vancomycin 1250mg  IV x 1 dose to be given with HD today since patient did not receive full loading dose.   Plan for Vancomycin 1g IV to be given with HD on HD days after today. Trough level prior to 3rd HD session.   Cefepime dose adjusted to Cefepime 1g IV every 24 hours.  Height: 6\' 2"  (188 cm) Weight: 210 lb (95.3 kg) IBW/kg (Calculated) : 82.2  Temp (24hrs), Avg:98 F (36.7 C), Min:98 F (36.7 C), Max:98 F (36.7 C)  Recent Labs  Lab 09/18/17 0349  WBC 5.6  CREATININE 7.48*  LATICACIDVEN 1.3    Estimated Creatinine Clearance: 11.8 mL/min (A) (by C-G formula based on SCr of 7.48 mg/dL (H)).    Allergies  Allergen Reactions  . Dust Mite Extract Other (See Comments)    Reaction: unknown   Thank you for allowing pharmacy to be a part of this patient's care.  Pernell Dupre, PharmD, BCPS Clinical Pharmacist 09/18/2017 10:39 AM

## 2017-09-18 NOTE — H&P (Signed)
[facility] NAME: [name]    MR#:  [id number]  DATE OF BIRTH:  06/04/1955  DATE OF ADMISSION:  09/18/2017  PRIMARY CARE PHYSICIAN: Donnie Coffin, MD   REQUESTING/REFERRING PHYSICIAN:  Dr Karma Greaser  CHIEF COMPLAINT:   Abdominal pain HISTORY OF PRESENT ILLNESS:  [name]  is a 62 y.o. male with a known history of end-stage renal disease on hemodialysis and cardiomyopathy who presented to the emergency room due to severe abdominal pain.  Patient underwent CT scan which showed evidence of pneumonia but no PE.  Patient is on chronic oxygen at home.  Patient reports no current abdominal pain at this time.  Patient had IV contrast for CT scan and nephrology recommended dialysis today due to the contrast dye.  Patient did have dialysis yesterday. Patient reports he is on a blood thinner for history of DVT/PE.  PAST MEDICAL HISTORY:   Past Medical History:  Diagnosis Date  . 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 Amputee, below knee, left ([place])   . Anemia   . Asthma   . Cardiomyopathy ([place])   . CHF (congestive heart failure) ([place])   . Chronic systolic heart failure ([place])   . Complication of anesthesia    hypotension  . COPD (chronic obstructive pulmonary disease) ([place])   . Coronary artery disease   . Dialysis patient ([place])    Mon, Wed, Fri  . End stage renal disease ([place])   . GERD (gastroesophageal reflux disease)   . Headache   . History of kidney stones   . History of pulmonary embolism   . HLD (hyperlipidemia)   . HTN (hypertension)   . Hyperparathyroidism   . Myocardial infarction ([place])   . Peripheral vascular disease ([place])   . Shortness of breath dyspnea   . Sleep apnea    NO C-PAP, Patient stated in process of  "getting one"   . Tobacco dependence     PAST SURGICAL HISTORY:   Past Surgical History:  Procedure Laterality Date  . A/V FISTULAGRAM Left 08/14/2017   Procedure: A/V FISTULAGRAM;   Surgeon: Algernon Huxley, MD;  Location: [facility];  Service: Cardiovascular;  Laterality: Left;  . AMPUTATION Left 05/06/2014   Procedure: AMPUTATION BELOW KNEE;  Surgeon: Elam Dutch, MD;  Location: [facility];  Service: Vascular;  Laterality: Left;  . AMPUTATION Right 01/12/2015   Procedure: Foot transmetatarsal amputation;  Surgeon: Algernon Huxley, MD;  Location: [facility];  Service: Vascular;  Laterality: Right;  . APPLICATION OF WOUND VAC Right 03/01/2015   Procedure: Application of Bio-connekt graft and wound vac application to right foot ;  Surgeon: Algernon Huxley, MD;  Location: [facility];  Service: Vascular;  Laterality: Right;  . AV FISTULA PLACEMENT Left   . AV FISTULA PLACEMENT Left 11/28/2016   Procedure: ARTERIOVENOUS (AV) FISTULA CREATION;  Surgeon: Algernon Huxley, MD;  Location: [facility];  Service: Vascular;  Laterality: Left;  . CARDIAC CATHETERIZATION     stent placement   . CORONARY ANGIOPLASTY    . DIALYSIS/PERMA CATHETER INSERTION N/A 07/31/2016   Procedure: Dialysis/Perma Catheter Insertion;  Surgeon: Algernon Huxley, MD;  Location: [facility];  Service: Cardiovascular;  Laterality: N/A;  . DIALYSIS/PERMA CATHETER INSERTION N/A 07/25/2017   Procedure: DIALYSIS/PERMA CATHETER INSERTION and fistulagram;  Surgeon: Algernon Huxley, MD;  Location: [facility];  Service: Cardiovascular;  Laterality: N/A;  .  DIALYSIS/PERMA CATHETER REMOVAL N/A 07/22/2017   Procedure: DIALYSIS/PERMA CATHETER REMOVAL;  Surgeon: Katha Cabal, MD;  Location: [facility];  Service: Cardiovascular;  Laterality: N/A;  . IR FLUORO GUIDE CV LINE LEFT  04/10/2017  . LIGATION OF ARTERIOVENOUS  FISTULA Right 01/31/2016   Procedure: LIGATION OF ARTERIOVENOUS  FISTULA;  Surgeon: Algernon Huxley, MD;  Location: [facility];  Service: Vascular;  Laterality: Right;  . PERIPHERAL VASCULAR CATHETERIZATION Right 12/15/2014   Procedure: Lower Extremity Angiography;  Surgeon: Algernon Huxley, MD;  Location: [facility];  Service: Cardiovascular;  Laterality: Right;  . PERIPHERAL VASCULAR CATHETERIZATION  12/15/2014   Procedure: Lower Extremity Intervention;  Surgeon: Algernon Huxley, MD;  Location: [facility];  Service: Cardiovascular;;  . PERIPHERAL VASCULAR CATHETERIZATION Right 08/14/2015   Procedure: A/V Shuntogram/Fistulagram;  Surgeon: Algernon Huxley, MD;  Location: [facility];  Service: Cardiovascular;  Laterality: Right;  . PERIPHERAL VASCULAR CATHETERIZATION N/A 08/14/2015   Procedure: A/V Shunt Intervention;  Surgeon: Algernon Huxley, MD;  Location: [facility];  Service: Cardiovascular;  Laterality: N/A;  . PERIPHERAL VASCULAR CATHETERIZATION N/A 01/11/2016   Procedure: Dialysis/Perma Catheter Insertion;  Surgeon: Algernon Huxley, MD;  Location: [facility];  Service: Cardiovascular;  Laterality: N/A;  . REVISON OF ARTERIOVENOUS FISTULA Right 02/17/2016   Procedure: removal of AV fistula;  Surgeon: Serafina Mitchell, MD;  Location: [facility];  Service: Vascular;  Laterality: Right;  . REVISON OF ARTERIOVENOUS FISTULA Right 01/31/2016   Procedure: REVISON OF ARTERIOVENOUS FISTULA ( BRACHIOCEPHALIC ) W/ ARTEGRAFT;  Surgeon: Algernon Huxley, MD;  Location: [facility];  Service: Vascular;  Laterality: Right;  . TRANSMETATARSAL AMPUTATION Right 05/04/2015   Procedure: TRANSMETATARSAL AMPUTATION REVISION, great toe amputation;  Surgeon: Algernon Huxley, MD;  Location: [facility];  Service: Vascular;  Laterality: Right;    SOCIAL HISTORY:   Social History   Tobacco Use  . Smoking status: Light Tobacco Smoker    Packs/day: 0.25    Years: 30.00    Pack years: 7.50    Types: Cigarettes  . Smokeless tobacco: Never Used  . Tobacco comment: 5  Substance Use Topics  . Alcohol use: No    Alcohol/week: 0.0 oz    FAMILY HISTORY:   Family History  Problem Relation Age of Onset  . Leukemia Mother   . Heart attack Father   . Heart failure Other   . Hypertension Other   . Leukemia  Other   . Diabetes Other   . Prostate cancer Neg Hx   . Kidney cancer Neg Hx   . Bladder Cancer Neg Hx     DRUG ALLERGIES:   Allergies  Allergen Reactions  . Dust Mite Extract Other (See Comments)    Reaction: unknown    REVIEW OF SYSTEMS:   Review of Systems  Constitutional: Negative.  Negative for chills, fever and malaise/fatigue.  HENT: Negative.  Negative for ear discharge, ear pain, hearing loss, nosebleeds and sore throat.   Eyes: Negative.  Negative for blurred vision and pain.  Respiratory: Negative.  Negative for cough, hemoptysis, shortness of breath and wheezing.   Cardiovascular: Negative.  Negative for chest pain, palpitations and leg swelling.  Gastrointestinal: Negative.  Negative for abdominal pain, blood in stool, diarrhea, nausea and vomiting.  Genitourinary: Negative.  Negative for dysuria.  Musculoskeletal: Negative.  Negative for back pain.  Skin: Negative.   Neurological: Negative for dizziness, tremors, speech change, focal weakness,  seizures and headaches.  Endo/Heme/Allergies: Negative.  Does not bruise/bleed easily.  Psychiatric/Behavioral: Negative.  Negative for depression, hallucinations and suicidal ideas.    MEDICATIONS AT HOME:   Prior to Admission medications   Medication Sig Start Date End Date Taking? Authorizing Provider  acetaminophen (TYLENOL) 500 MG tablet Take 500 mg by mouth every 6 (six) hours as needed for mild pain.    Yes [provider]  albuterol (PROVENTIL HFA;VENTOLIN HFA) 108 (90 Base) MCG/ACT inhaler Inhale 1-2 puffs every 6 (six) hours as needed into the lungs for wheezing or shortness of breath.   Yes [provider]  apixaban (ELIQUIS) 5 MG TABS tablet Take 1 tablet (5 mg total) by mouth 2 (two) times daily. 01/20/17  Yes Gladstone Lighter, MD  aspirin EC 325 MG tablet Take 325 mg by mouth daily.    Yes [provider]  atorvastatin (LIPITOR) 10 MG tablet Take 10 mg by mouth at bedtime.    Yes  [provider]  budesonide-formoterol (SYMBICORT) 160-4.5 MCG/ACT inhaler Inhale 2 puffs into the lungs 2 (two) times daily. Reported on 08/14/2015   Yes [provider]  calcium acetate (PHOSLO) 667 MG capsule Take 667 mg by mouth daily.    Yes [provider]  carvedilol (COREG) 3.125 MG tablet Take 1 tablet (3.125 mg total) by mouth 2 (two) times daily with a meal. 01/20/17  Yes Gladstone Lighter, MD  cetirizine (ZYRTEC) 10 MG tablet Take 10 mg by mouth daily.   Yes [provider]  docusate sodium (COLACE) 100 MG capsule Take 100 mg by mouth daily as needed for mild constipation or moderate constipation.    Yes [provider]  fluticasone (FLONASE) 50 MCG/ACT nasal spray Place 1 spray daily into the nose.    Yes [provider]  guaifenesin (ROBAFEN) 100 MG/5ML syrup Take 200 mg by mouth 3 (three) times daily as needed for cough.   Yes [provider]  Ipratropium-Albuterol (COMBIVENT RESPIMAT) 20-100 MCG/ACT AERS respimat Inhale 1 puff into the lungs every 6 (six) hours as needed for wheezing or shortness of breath.   Yes [provider]  isosorbide mononitrate (IMDUR) 30 MG 24 hr tablet Take 30 mg by mouth daily.  08/22/16  Yes [provider]  midodrine (PROAMATINE) 10 MG tablet Take 5 mg by mouth 2 (two) times daily.   Yes [provider]  Multiple Vitamin (MULTIVITAMIN WITH MINERALS) TABS tablet Take 1 tablet by mouth 2 (two) times daily.   Yes [provider]  nitroGLYCERIN (NITROSTAT) 0.4 MG SL tablet Place 0.4 mg under the tongue every 5 (five) minutes as needed for chest pain.   Yes [provider]  pantoprazole (PROTONIX) 20 MG tablet Take 20 mg by mouth daily.   Yes [provider]  patiromer (VELTASSA) 8.4 g packet Take 1 packet (8.4 g total) by mouth daily. 07/26/17  Yes Lauraine Crespo, Ulice Bold, MD  polyethylene glycol (MIRALAX / GLYCOLAX) packet Take 17 g by mouth daily as needed  for mild constipation.    Yes [provider]  pregabalin (LYRICA) 75 MG capsule Take 1 capsule (75 mg total) by mouth daily. 01/22/17  Yes Gladstone Lighter, MD  sevelamer carbonate (RENVELA) 800 MG tablet Take 800 mg by mouth 3 (three) times daily with meals.    Yes [provider]  tiotropium (SPIRIVA) 18 MCG inhalation capsule Place 18 mcg into inhaler and inhale daily.   Yes [provider]      VITAL SIGNS:  Blood pressure 110/66, pulse 72, temperature (P) 98.2 F (36.8 C), temperature source (P) Oral, resp. rate 12, height 6\' 2"  (1.88 m), weight 95.3 kg (210 lb), SpO2 (P) 94 %.  PHYSICAL EXAMINATION:   Physical Exam  Constitutional: He is oriented to person, place, and time. No distress.  HENT:  Head: Normocephalic.  Eyes: No scleral icterus.  Neck: Normal range of motion. Neck supple. No JVD present. No tracheal deviation present.  Cardiovascular: Normal rate, regular rhythm and normal heart sounds. Exam reveals no gallop and no friction rub.  No murmur heard. Pulmonary/Chest: Effort normal and breath sounds normal. No respiratory distress. He has no wheezes. He has no rales. He exhibits no tenderness.  Abdominal: Soft. Bowel sounds are normal. He exhibits no distension and no mass. There is no tenderness. There is no rebound and no guarding.  Musculoskeletal: Normal range of motion. He exhibits no edema.  Neurological: He is alert and oriented to person, place, and time.  Skin: Skin is warm. No rash noted. No erythema.  Psychiatric: He has a normal mood and affect. His behavior is normal. Judgment normal.      LABORATORY PANEL:   CBC Recent Labs  Lab 09/18/17 0349  WBC 5.6  HGB 12.6*  HCT 38.9*  PLT 152   ------------------------------------------------------------------------------------------------------------------  Chemistries  Recent Labs  Lab 09/18/17 0349  NA 140  K 4.9  CL 103  CO2 28  GLUCOSE 119*  BUN 53*  CREATININE  7.48*  CALCIUM 9.0  MG 2.5*  AST 135*  ALT 49  ALKPHOS 152*  BILITOT 1.2   ------------------------------------------------------------------------------------------------------------------  Cardiac Enzymes Recent Labs  Lab 09/18/17 0349  TROPONINI 0.16*   ------------------------------------------------------------------------------------------------------------------  RADIOLOGY:  Ct Angio Chest/abd/pel For Dissection W And/or W/wo  Result Date: 09/18/2017 CLINICAL DATA:  Abdominal pain beginning a few hours ago. Dialysis treatment yesterday. Acute upper and central abdominal pain. EXAM: CT ANGIOGRAPHY CHEST, ABDOMEN AND PELVIS TECHNIQUE: Multidetector CT imaging through the chest, abdomen and pelvis was performed using the standard protocol during bolus administration of intravenous contrast. Multiplanar reconstructed images and MIPs were obtained and reviewed to evaluate the vascular anatomy. CONTRAST:  172mL ISOVUE-370 IOPAMIDOL (ISOVUE-370) INJECTION 76% COMPARISON:  CT chest 07/21/2017. FINDINGS: CTA CHEST FINDINGS Cardiovascular: Noncontrast images of the chest demonstrate no evidence of intramural hematoma. Calcification of the aorta with prominent calcification of the coronary arteries. Catheter tip demonstrated in the inferior vena cava at the inferior vena cavoatrial junction. Images obtained during arterial phase after contrast administration demonstrate normal caliber thoracic aorta. No aortic dissection. Central pulmonary arteries are well opacified. No evidence of significant pulmonary embolus. Cardiac enlargement. No pericardial effusion. Mediastinum/Nodes: No enlarged mediastinal, hilar, or axillary lymph nodes. Thyroid gland, trachea, and esophagus demonstrate no significant findings. Lungs/Pleura: Small right pleural effusion. Consolidation in the right lower lung. This likely represents pneumonia. Dependent atelectasis also in the lung bases. Airways are patent. No  pneumothorax. Musculoskeletal: Degenerative changes in the spine. No destructive bone lesions. Review of the MIP images confirms the above findings. CTA ABDOMEN AND PELVIS FINDINGS VASCULAR Aorta: Normal caliber aorta without aneurysm, dissection, vasculitis or significant stenosis. Diffuse aortic calcification. Celiac: Patent without evidence of aneurysm, dissection, vasculitis or significant stenosis. SMA: Patent without evidence of aneurysm, dissection, vasculitis or significant stenosis. Renals: Prominent calcification at the origin of the renal arteries. Diminished flow to both renal arteries although both arteries remain patent. This is likely to represent proximal renal artery stenosis bilaterally. Nephrograms are symmetrical. IMA: Patent without evidence of  aneurysm, dissection, vasculitis or significant stenosis. Inflow: Patent without evidence of aneurysm, dissection, vasculitis or significant stenosis. Veins: There is a right femoral vein catheter with tip in the inferior vena caval atrial junction. Review of the MIP images confirms the above findings. NON-VASCULAR Hepatobiliary: No focal liver abnormality is seen. No gallstones, gallbladder wall thickening, or biliary dilatation. Pancreas: Unremarkable. No pancreatic ductal dilatation or surrounding inflammatory changes. Spleen: Normal in size without focal abnormality. Adrenals/Urinary Tract: No adrenal gland nodules. Bilateral renal parenchymal atrophy with diminished nephrograms consistent with chronic renal disease, likely vascular. No hydronephrosis or hydroureter. Bladder is decompressed. Stomach/Bowel: Stomach is within normal limits. Appendix appears normal. No evidence of bowel wall thickening, distention, or inflammatory changes. Lymphatic: No significant lymphadenopathy. Reproductive: Prostate is unremarkable. Other: No abdominal wall hernia or abnormality. No abdominopelvic ascites. Musculoskeletal: Degenerative changes in the spine. No  destructive bone lesions. Review of the MIP images confirms the above findings. IMPRESSION: 1. No evidence of aneurysm or dissection involving the thoracic or abdominal aorta. No evidence of significant pulmonary embolus. 2. Extensive aortic and branch vessel calcifications. Coronary artery calcifications. 3. Bilateral renal atrophy with probable bilateral renal artery stenosis. 4. Small right pleural effusion with consolidation in the right lower lung likely representing pneumonia. Electronically Signed   By: Lucienne Capers M.D.   On: 09/18/2017 05:23    EKG:   Orders placed or performed during the hospital encounter of 09/18/17  . EKG 12-Lead  . EKG 12-Lead    IMPRESSION AND PLAN:   62 year old male with end-stage renal disease on hemodialysis, chronic systolic heart failure ejection fraction 20 to 25% and pulmonary emboli/DVT who presented to the emergency room due to abdominal pain which is now subsided.  1.  Chronic hypoxic failure with pneumonia: Patient will be started on cefepime and vancomycin.  If MRSA PCR is negative then discontinue vancomycin.  2.  End-stage renal disease on hemodialysis: Patient will undergo dialysis today due to the IV contrast given for CT scan.  He will continue on regular dialysis days Monday, Wednesday and Friday.  3.  Chronic systolic heart failure ejection fraction 20 to 25% without signs of exacerbation Continue Coreg 4.  History of PE/DVT: Continue Eliquis and  5.  COPD without signs of exacerbation: Continue Spiriva  6.  Essential hypertension: Continue isosorbide, Coreg  7.  CAD: Continue aspirin, statin, isosorbide and Coreg  8. Tobacco dependence: Patient is encouraged to quit smoking. Counseling was provided for 4 minutes.     All the records are reviewed and case discussed with ED provider. Management plans discussed with the patient and he is in agreement  CODE STATUS: FULl  TOTAL TIME TAKING CARE OF THIS PATIENT: 38 minutes.     Emerson Schreifels M.D on 09/18/2017 at 10:55 AM  Between 7am to 6pm - Pager - [phone]  After 6pm go to [email or web address] - password [email or web address]  Office  [phone]  CC: Primary care physician; Donnie Coffin, MD

## 2017-09-19 DIAGNOSIS — J189 Pneumonia, unspecified organism: Secondary | ICD-10-CM | POA: Diagnosis not present

## 2017-09-19 DIAGNOSIS — J181 Lobar pneumonia, unspecified organism: Secondary | ICD-10-CM | POA: Diagnosis not present

## 2017-09-19 LAB — BASIC METABOLIC PANEL
Anion gap: 9 (ref 5–15)
BUN: 42 mg/dL — ABNORMAL HIGH (ref 6–20)
CHLORIDE: 102 mmol/L (ref 101–111)
CO2: 26 mmol/L (ref 22–32)
CREATININE: 6.8 mg/dL — AB (ref 0.61–1.24)
Calcium: 9.3 mg/dL (ref 8.9–10.3)
GFR calc non Af Amer: 8 mL/min — ABNORMAL LOW (ref >60)
GFR, EST AFRICAN AMERICAN: 9 mL/min — AB (ref >60)
Glucose, Bld: 98 mg/dL (ref 65–99)
Potassium: 5.2 mmol/L — ABNORMAL HIGH (ref 3.5–5.1)
Sodium: 137 mmol/L (ref 135–145)

## 2017-09-19 LAB — CBC
HEMATOCRIT: 39 % — AB (ref 40.0–52.0)
HEMATOCRIT: 39 % — AB (ref 40.0–52.0)
Hemoglobin: 12.6 g/dL — ABNORMAL LOW (ref 13.0–18.0)
Hemoglobin: 12.6 g/dL — ABNORMAL LOW (ref 13.0–18.0)
MCH: 31.1 pg (ref 26.0–34.0)
MCH: 30.9 pg (ref 26.0–34.0)
MCHC: 32.3 g/dL (ref 32.0–36.0)
MCHC: 32.4 g/dL (ref 32.0–36.0)
MCV: 96.1 fL (ref 80.0–100.0)
MCV: 95.3 fL (ref 80.0–100.0)
PLATELETS: 138 10*3/uL — AB (ref 150–440)
Platelets: 133 10*3/uL — ABNORMAL LOW (ref 150–440)
RBC: 4.06 MIL/uL — AB (ref 4.40–5.90)
RBC: 4.1 MIL/uL — ABNORMAL LOW (ref 4.40–5.90)
RDW: 20.3 % — ABNORMAL HIGH (ref 11.5–14.5)
RDW: 20.1 % — AB (ref 11.5–14.5)
WBC: 6.6 10*3/uL (ref 3.8–10.6)
WBC: 7.5 10*3/uL (ref 3.8–10.6)

## 2017-09-19 LAB — HEPATITIS B SURFACE ANTIGEN: Hepatitis B Surface Ag: NEGATIVE

## 2017-09-19 MED ORDER — AMOXICILLIN-POT CLAVULANATE 875-125 MG PO TABS: 1.0000 | ORAL_TABLET | Freq: Two times a day (BID) | ORAL | 0 refills | Status: DC

## 2017-09-19 MED ORDER — AMOXICILLIN-POT CLAVULANATE 875-125 MG PO TABS: 1.0000 | ORAL_TABLET | Freq: Every day | ORAL | 0 refills | Status: AC

## 2017-09-19 NOTE — Progress Notes (Signed)
[facility]  ROUNDING NOTE   Subjective:   Seen and examined on hemodialysis. Tolerating treatment well.     HEMODIALYSIS FLOWSHEET:  Blood Flow Rate (mL/min): 325 mL/min Arterial Pressure (mmHg): -220 mmHg Venous Pressure (mmHg): 210 mmHg Transmembrane Pressure (mmHg): 100 mmHg Ultrafiltration Rate (mL/min): 830 mL/min Dialysate Flow Rate (mL/min): 600 ml/min Conductivity: Machine : 14 Conductivity: Machine : 14 Dialysis Fluid Bolus: Normal Saline Bolus Amount (mL): 250 mL Dialysate Change: Other (comment)([id number])    Objective:  Vital signs in last 24 hours:  Temp:  [97.8 F (36.6 C)-99.1 F (37.3 C)] 99 F (37.2 C) (04/26 1053) Pulse Rate:  [69-81] 69 (04/26 1230) Resp:  [10-20] 14 (04/26 1230) BP: (115-152)/(62-87) 118/76 (04/26 1230) SpO2:  [92 %-98 %] 98 % (04/26 1230) Weight:  [88.6 kg (195 lb 5.2 oz)] 88.6 kg (195 lb 5.2 oz) (04/26 1053)  Weight change:  Filed Weights   09/18/17 0347 09/19/17 1053  Weight: 95.3 kg (210 lb) 88.6 kg (195 lb 5.2 oz)    Intake/Output: No intake/output data recorded.   Intake/Output this shift:  No intake/output data recorded.  Physical Exam: General: NAD, laying in bed  Head: Normocephalic, atraumatic. Moist oral mucosal membranes  Eyes: Anicteric, PERRL  Neck: Supple, trachea midline  Lungs:  Clear to auscultation  Heart: Regular rate and rhythm  Abdomen:  Soft, obese  Extremities: no peripheral edema.  Neurologic: Nonfocal, moving all four extremities  Skin: No lesions  Access: Right femoral permcath    Basic Metabolic Panel: Recent Labs  Lab 09/18/17 0349 09/19/17 0338  NA 140 137  K 4.9 5.2*  CL 103 102  CO2 28 26  GLUCOSE 119* 98  BUN 53* 42*  CREATININE 7.48* 6.80*  CALCIUM 9.0 9.3  MG 2.5*  --     Liver Function Tests: Recent Labs  Lab 09/18/17 0349  AST 135*  ALT 49  ALKPHOS 152*  BILITOT 1.2  PROT 7.3  ALBUMIN 2.9*   No results for input(s): LIPASE, AMYLASE in the last  168 hours. No results for input(s): AMMONIA in the last 168 hours.  CBC: Recent Labs  Lab 09/18/17 0349 09/19/17 0338 09/19/17 1044  WBC 5.6 6.6 7.5  NEUTROABS 2.8  --   --   HGB 12.6* 12.6* 12.6*  HCT 38.9* 39.0* 39.0*  MCV 96.0 96.1 95.3  PLT 152 138* 133*    Cardiac Enzymes: Recent Labs  Lab 09/18/17 0349  TROPONINI 0.16*    BNP: Invalid input(s): POCBNP  CBG: No results for input(s): GLUCAP in the last 168 hours.  Microbiology: Results for orders placed or performed during the hospital encounter of 09/18/17  Blood Culture (routine x 2)     Status: None (Preliminary result)   Collection Time: 09/18/17  6:30 AM  Result Value Ref Range Status   Specimen Description BLOOD BLOOD RIGHT FOREARM  Final   Special Requests   Final    BOTTLES DRAWN AEROBIC AND ANAEROBIC Blood Culture adequate volume   Culture   Final    NO GROWTH < 24 HOURS Performed at [facility], [address], [place], [place] [address]    Report Status PENDING  Incomplete  Blood Culture (routine x 2)     Status: None (Preliminary result)   Collection Time: 09/18/17  6:34 AM  Result Value Ref Range Status   Specimen Description BLOOD RIGHT HAND  Final   Special Requests   Final    BOTTLES DRAWN AEROBIC AND ANAEROBIC Blood Culture adequate  volume   Culture   Final    NO GROWTH < 24 HOURS Performed at [facility], [address] [place], [place] [address]    Report Status PENDING  Incomplete  MRSA PCR Screening     Status: Abnormal   Collection Time: 09/18/17  2:13 PM  Result Value Ref Range Status   MRSA by PCR POSITIVE (A) NEGATIVE Final    Comment:        The GeneXpert MRSA Assay (FDA approved for NASAL specimens only), is one component of a comprehensive MRSA colonization surveillance program. It is not intended to diagnose MRSA infection nor to guide or monitor treatment for MRSA infections. RESULT CALLED TO, READ BACK BY AND VERIFIED WITH: [facility] [place] AT  9292 ON 09/18/2017 JJB Performed at [facility], [address] [place], [place] [address]     Coagulation Studies: No results for input(s): LABPROT, INR in the last 72 hours.  Urinalysis: No results for input(s): COLORURINE, LABSPEC, PHURINE, GLUCOSEU, HGBUR, BILIRUBINUR, KETONESUR, PROTEINUR, UROBILINOGEN, NITRITE, LEUKOCYTESUR in the last 72 hours.  Invalid input(s): APPERANCEUR    Imaging: Ct Angio Chest/abd/pel For Dissection W And/or W/wo  Result Date: 09/18/2017 CLINICAL DATA:  Abdominal pain beginning a few hours ago. Dialysis treatment yesterday. Acute upper and central abdominal pain. EXAM: CT ANGIOGRAPHY CHEST, ABDOMEN AND PELVIS TECHNIQUE: Multidetector CT imaging through the chest, abdomen and pelvis was performed using the standard protocol during bolus administration of intravenous contrast. Multiplanar reconstructed images and MIPs were obtained and reviewed to evaluate the vascular anatomy. CONTRAST:  123mL ISOVUE-370 IOPAMIDOL (ISOVUE-370) INJECTION 76% COMPARISON:  CT chest 07/21/2017. FINDINGS: CTA CHEST FINDINGS Cardiovascular: Noncontrast images of the chest demonstrate no evidence of intramural hematoma. Calcification of the aorta with prominent calcification of the coronary arteries. Catheter tip demonstrated in the inferior vena cava at the inferior vena cavoatrial junction. Images obtained during arterial phase after contrast administration demonstrate normal caliber thoracic aorta. No aortic dissection. Central pulmonary arteries are well opacified. No evidence of significant pulmonary embolus. Cardiac enlargement. No pericardial effusion. Mediastinum/Nodes: No enlarged mediastinal, hilar, or axillary lymph nodes. Thyroid gland, trachea, and esophagus demonstrate no significant findings. Lungs/Pleura: Small right pleural effusion. Consolidation in the right lower lung. This likely represents pneumonia. Dependent atelectasis also in the lung bases. Airways are  patent. No pneumothorax. Musculoskeletal: Degenerative changes in the spine. No destructive bone lesions. Review of the MIP images confirms the above findings. CTA ABDOMEN AND PELVIS FINDINGS VASCULAR Aorta: Normal caliber aorta without aneurysm, dissection, vasculitis or significant stenosis. Diffuse aortic calcification. Celiac: Patent without evidence of aneurysm, dissection, vasculitis or significant stenosis. SMA: Patent without evidence of aneurysm, dissection, vasculitis or significant stenosis. Renals: Prominent calcification at the origin of the renal arteries. Diminished flow to both renal arteries although both arteries remain patent. This is likely to represent proximal renal artery stenosis bilaterally. Nephrograms are symmetrical. IMA: Patent without evidence of aneurysm, dissection, vasculitis or significant stenosis. Inflow: Patent without evidence of aneurysm, dissection, vasculitis or significant stenosis. Veins: There is a right femoral vein catheter with tip in the inferior vena caval atrial junction. Review of the MIP images confirms the above findings. NON-VASCULAR Hepatobiliary: No focal liver abnormality is seen. No gallstones, gallbladder wall thickening, or biliary dilatation. Pancreas: Unremarkable. No pancreatic ductal dilatation or surrounding inflammatory changes. Spleen: Normal in size without focal abnormality. Adrenals/Urinary Tract: No adrenal gland nodules. Bilateral renal parenchymal atrophy with diminished nephrograms consistent with chronic renal disease, likely vascular. No hydronephrosis or hydroureter. Bladder  is decompressed. Stomach/Bowel: Stomach is within normal limits. Appendix appears normal. No evidence of bowel wall thickening, distention, or inflammatory changes. Lymphatic: No significant lymphadenopathy. Reproductive: Prostate is unremarkable. Other: No abdominal wall hernia or abnormality. No abdominopelvic ascites. Musculoskeletal: Degenerative changes in the  spine. No destructive bone lesions. Review of the MIP images confirms the above findings. IMPRESSION: 1. No evidence of aneurysm or dissection involving the thoracic or abdominal aorta. No evidence of significant pulmonary embolus. 2. Extensive aortic and branch vessel calcifications. Coronary artery calcifications. 3. Bilateral renal atrophy with probable bilateral renal artery stenosis. 4. Small right pleural effusion with consolidation in the right lower lung likely representing pneumonia. Electronically Signed   By: Lucienne Capers M.D.   On: 09/18/2017 05:23     Medications:   . sodium chloride    . ceFEPime (MAXIPIME) IV    . vancomycin     . apixaban  5 mg Oral BID  . aspirin EC  325 mg Oral Daily  . atorvastatin  10 mg Oral QHS  . calcium acetate  667 mg Oral Q breakfast  . carvedilol  3.125 mg Oral BID WC  . Chlorhexidine Gluconate Cloth  6 each Topical Q0600  . isosorbide mononitrate  30 mg Oral Daily  . loratadine  10 mg Oral Daily  . midodrine  5 mg Oral BID  . mometasone-formoterol  2 puff Inhalation BID  . multivitamin with minerals  1 tablet Oral BID  . mupirocin ointment  1 application Nasal BID  . pantoprazole  40 mg Oral QAC breakfast  . patiromer  8.4 g Oral Daily  . pregabalin  75 mg Oral Daily  . sevelamer carbonate  800 mg Oral TID WC  . sodium chloride flush  3 mL Intravenous Q12H  . tiotropium  18 mcg Inhalation Daily     Assessment/ Plan:  [name] is a 62 y.o. black male with ESRD on hemodialysis, hypertension, hyperlipidemia, anemia, COPD, peripheral vascular disease, left BKA, right toe amputations  MWF [facility].   1. End stage renal disease: requiring an extra treatment due to IV contrast load on admission. Seen and examined on regularly scheduled hemodialysis.   Tolerating treatment well.   2. Anemia of chronic kidney disease:  hemoglobin 12.6 - EPO as outpatient.   3. Hypotension:  - midodrine before  dialysis.   4. Secondary Hyperparathyroidism: - calcium acetateand sevelamerwith meals.   LOS: 1 Mayrin Schmuck 4/26/201912:37 PM

## 2017-09-19 NOTE — Progress Notes (Signed)
HD Tx ended   09/19/17 1354  Oxygen Therapy  SpO2 97 %  During Hemodialysis Assessment  HD Safety Checks Performed Yes  Dialysis Fluid Bolus Normal Saline  Bolus Amount (mL) 250 mL  Intra-Hemodialysis Comments Tx completed;Tolerated well

## 2017-09-19 NOTE — Progress Notes (Signed)
HD Tx Started    09/19/17 1100  Vital Signs  Pulse Rate 72  Resp 14  BP (!) 143/62  Oxygen Therapy  SpO2 97 %  During Hemodialysis Assessment  Blood Flow Rate (mL/min) 400 mL/min  Arterial Pressure (mmHg) -220 mmHg  Venous Pressure (mmHg) 210 mmHg  Transmembrane Pressure (mmHg) 80 mmHg  Ultrafiltration Rate (mL/min) 830 mL/min  Dialysate Flow Rate (mL/min) 600 ml/min  Conductivity: Machine  13.8  HD Safety Checks Performed Yes  Dialysis Fluid Bolus Normal Saline  Bolus Amount (mL) 250 mL  Intra-Hemodialysis Comments Tx initiated (Pt stable )

## 2017-09-19 NOTE — Progress Notes (Addendum)
Patient is stable for D/C back to [facility] today after dialysis. Per [employer] resident care coordinator at [facility] patient can return today via EMS. RN will call report and arrange EMS for transport. Clinical Education officer, museum (CSW) sent D/C summary and FL2 to [facility]. CSW contacted patient's sister Lucita Ferrara and made her aware of above. [facility] dialysis coordinator is aware of above. Please reconsult if future social work needs arise. CSW signing off.   [employer], LCSW [phone]

## 2017-09-19 NOTE — Discharge Summary (Addendum)
[facility] NAME: [name]    MR#:  [id number]  DATE OF BIRTH:  1955/08/06  DATE OF ADMISSION:  09/18/2017 ADMITTING PHYSICIAN: Bettey Costa, MD  DATE OF DISCHARGE: 09/19/2017   PRIMARY CARE PHYSICIAN: Donnie Coffin, MD    ADMISSION DIAGNOSIS:  Generalized abdominal pain [R10.84] Pleural effusion, right [J90] ESRD on hemodialysis ([place]) [N18.6, Z99.2] HCAP (healthcare-associated pneumonia) [J18.9] Pneumonia of right lower lobe due to infectious organism ([place]) [J18.1]  DISCHARGE DIAGNOSIS:  Active Problems:   Pneumonia   SECONDARY DIAGNOSIS:   Past Medical History:  Diagnosis Date  . 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 Amputee, below knee, left ([place])   . Anemia   . Asthma   . Cardiomyopathy ([place])   . CHF (congestive heart failure) ([place])   . Chronic systolic heart failure ([place])   . Complication of anesthesia    hypotension  . COPD (chronic obstructive pulmonary disease) ([place])   . Coronary artery disease   . Dialysis patient ([place])    Mon, Wed, Fri  . End stage renal disease ([place])   . GERD (gastroesophageal reflux disease)   . Headache   . History of kidney stones   . History of pulmonary embolism   . HLD (hyperlipidemia)   . HTN (hypertension)   . Hyperparathyroidism   . Myocardial infarction ([place])   . Peripheral vascular disease ([place])   . Shortness of breath dyspnea   . Sleep apnea    NO C-PAP, Patient stated in process of  "getting one"   . Tobacco dependence     HOSPITAL COURSE:  62 year old male with end-stage renal disease on hemodialysis, chronic systolic heart failure ejection fraction 20 to 25% and pulmonary emboli/DVT who presented to the emergency room due to abdominal pain which is now subsided.  1.  Chronic hypoxic failure with pneumonia: Patient was on cefepime and vancomycin and will be discharged on AUGMENTIN.   2.  End-stage renal disease on hemodialysis:  He will  continue on regular dialysis days Monday, Wednesday and Friday.  3.  Chronic systolic heart failure ejection fraction 20 to 25% without signs of exacerbation Continue Coreg.   4.  History of PE/DVT: Continue Eliquis and Coreg  5.  COPD without signs of exacerbation: Continue Spiriva  6.  Essential hypertension: Continue isosorbide, Coreg  7.  CAD: Continue aspirin, statin, isosorbide and Coreg  8. Tobacco dependence: Patient is encouraged to quit smoking. Counseling was provided      DISCHARGE CONDITIONS AND DIET:   Stable renal diet  CONSULTS OBTAINED:  Treatment Team:  Lavonia Dana, MD  DRUG ALLERGIES:   Allergies  Allergen Reactions  . Dust Mite Extract Other (See Comments)    Reaction: unknown    DISCHARGE MEDICATIONS:   Allergies as of 09/19/2017      Reactions   Dust Mite Extract Other (See Comments)   Reaction: unknown      Medication List    TAKE these medications   acetaminophen 500 MG tablet Commonly known as:  TYLENOL Take 500 mg by mouth every 6 (six) hours as needed for mild pain.   albuterol 108 (90 Base) MCG/ACT inhaler Commonly known as:  PROVENTIL HFA;VENTOLIN HFA Inhale 1-2 puffs every 6 (six) hours as needed into the lungs for wheezing or shortness of breath.   amoxicillin-clavulanate 875-125 MG tablet Commonly known as:  AUGMENTIN Take 1 tablet by mouth daily for 5 days.  apixaban 5 MG Tabs tablet Commonly known as:  ELIQUIS Take 1 tablet (5 mg total) by mouth 2 (two) times daily.   aspirin EC 325 MG tablet Take 325 mg by mouth daily.   atorvastatin 10 MG tablet Commonly known as:  LIPITOR Take 10 mg by mouth at bedtime.   budesonide-formoterol 160-4.5 MCG/ACT inhaler Commonly known as:  SYMBICORT Inhale 2 puffs into the lungs 2 (two) times daily. Reported on 08/14/2015   calcium acetate 667 MG capsule Commonly known as:  PHOSLO Take 667 mg by mouth daily.   carvedilol 3.125 MG tablet Commonly known as:   COREG Take 1 tablet (3.125 mg total) by mouth 2 (two) times daily with a meal.   cetirizine 10 MG tablet Commonly known as:  ZYRTEC Take 10 mg by mouth daily.   COMBIVENT RESPIMAT 20-100 MCG/ACT Aers respimat Generic drug:  Ipratropium-Albuterol Inhale 1 puff into the lungs every 6 (six) hours as needed for wheezing or shortness of breath.   docusate sodium 100 MG capsule Commonly known as:  COLACE Take 100 mg by mouth daily as needed for mild constipation or moderate constipation.   fluticasone 50 MCG/ACT nasal spray Commonly known as:  FLONASE Place 1 spray daily into the nose.   isosorbide mononitrate 30 MG 24 hr tablet Commonly known as:  IMDUR Take 30 mg by mouth daily.   midodrine 10 MG tablet Commonly known as:  PROAMATINE Take 5 mg by mouth 2 (two) times daily.   multivitamin with minerals Tabs tablet Take 1 tablet by mouth 2 (two) times daily.   nitroGLYCERIN 0.4 MG SL tablet Commonly known as:  NITROSTAT Place 0.4 mg under the tongue every 5 (five) minutes as needed for chest pain.   pantoprazole 20 MG tablet Commonly known as:  PROTONIX Take 20 mg by mouth daily.   patiromer 8.4 g packet Commonly known as:  VELTASSA Take 1 packet (8.4 g total) by mouth daily.   polyethylene glycol packet Commonly known as:  MIRALAX / GLYCOLAX Take 17 g by mouth daily as needed for mild constipation.   pregabalin 75 MG capsule Commonly known as:  LYRICA Take 1 capsule (75 mg total) by mouth daily.   ROBAFEN 100 MG/5ML syrup Generic drug:  guaifenesin Take 200 mg by mouth 3 (three) times daily as needed for cough.   sevelamer carbonate 800 MG tablet Commonly known as:  RENVELA Take 800 mg by mouth 3 (three) times daily with meals.   tiotropium 18 MCG inhalation capsule Commonly known as:  SPIRIVA Place 18 mcg into inhaler and inhale daily.         Today   CHIEF COMPLAINT:  No issues overnight   VITAL SIGNS:  Blood pressure 132/72, pulse 74,  temperature 99.1 F (37.3 C), temperature source Oral, resp. rate 18, height 6\' 2"  (1.88 m), weight 95.3 kg (210 lb), SpO2 95 %.   REVIEW OF SYSTEMS:  Review of Systems  Constitutional: Negative.  Negative for chills, fever and malaise/fatigue.  HENT: Negative.  Negative for ear discharge, ear pain, hearing loss, nosebleeds and sore throat.   Eyes: Negative.  Negative for blurred vision and pain.  Respiratory: Negative.  Negative for cough, hemoptysis, shortness of breath and wheezing.   Cardiovascular: Negative.  Negative for chest pain, palpitations and leg swelling.  Gastrointestinal: Negative.  Negative for abdominal pain, blood in stool, diarrhea, nausea and vomiting.  Genitourinary: Negative.  Negative for dysuria.  Musculoskeletal: Negative.  Negative for back pain.  Skin: Negative.  Neurological: Negative for dizziness, tremors, speech change, focal weakness, seizures and headaches.  Endo/Heme/Allergies: Negative.  Does not bruise/bleed easily.  Psychiatric/Behavioral: Negative.  Negative for depression, hallucinations and suicidal ideas.     PHYSICAL EXAMINATION:  GENERAL:  62 y.o.-year-old patient lying in the bed with no acute distress.  NECK:  Supple, no jugular venous distention. No thyroid enlargement, no tenderness.  LUNGS: Normal breath sounds bilaterally, no wheezing, rales,rhonchi  No use of accessory muscles of respiration.  CARDIOVASCULAR: S1, S2 normal. No murmurs, rubs, or gallops.  ABDOMEN: Soft, non-tender, non-distended. Bowel sounds present. No organomegaly or mass.  EXTREMITIES: No pedal edema, cyanosis, or clubbing.  PSYCHIATRIC: The patient is alert and oriented x 3.  SKIN: No obvious rash, lesion, or ulcer.   DATA REVIEW:   CBC Recent Labs  Lab 09/19/17 0338  WBC 6.6  HGB 12.6*  HCT 39.0*  PLT 138*    Chemistries  Recent Labs  Lab 09/18/17 0349 09/19/17 0338  NA 140 137  K 4.9 5.2*  CL 103 102  CO2 28 26  GLUCOSE 119* 98  BUN 53*  42*  CREATININE 7.48* 6.80*  CALCIUM 9.0 9.3  MG 2.5*  --   AST 135*  --   ALT 49  --   ALKPHOS 152*  --   BILITOT 1.2  --     Cardiac Enzymes Recent Labs  Lab 09/18/17 0349  TROPONINI 0.16*    Microbiology Results  @[email or web address] @  RADIOLOGY:  Ct Angio Chest/abd/pel For Dissection W And/or W/wo  Result Date: 09/18/2017 CLINICAL DATA:  Abdominal pain beginning a few hours ago. Dialysis treatment yesterday. Acute upper and central abdominal pain. EXAM: CT ANGIOGRAPHY CHEST, ABDOMEN AND PELVIS TECHNIQUE: Multidetector CT imaging through the chest, abdomen and pelvis was performed using the standard protocol during bolus administration of intravenous contrast. Multiplanar reconstructed images and MIPs were obtained and reviewed to evaluate the vascular anatomy. CONTRAST:  166mL ISOVUE-370 IOPAMIDOL (ISOVUE-370) INJECTION 76% COMPARISON:  CT chest 07/21/2017. FINDINGS: CTA CHEST FINDINGS Cardiovascular: Noncontrast images of the chest demonstrate no evidence of intramural hematoma. Calcification of the aorta with prominent calcification of the coronary arteries. Catheter tip demonstrated in the inferior vena cava at the inferior vena cavoatrial junction. Images obtained during arterial phase after contrast administration demonstrate normal caliber thoracic aorta. No aortic dissection. Central pulmonary arteries are well opacified. No evidence of significant pulmonary embolus. Cardiac enlargement. No pericardial effusion. Mediastinum/Nodes: No enlarged mediastinal, hilar, or axillary lymph nodes. Thyroid gland, trachea, and esophagus demonstrate no significant findings. Lungs/Pleura: Small right pleural effusion. Consolidation in the right lower lung. This likely represents pneumonia. Dependent atelectasis also in the lung bases. Airways are patent. No pneumothorax. Musculoskeletal: Degenerative changes in the spine. No destructive bone lesions. Review of the MIP images confirms the above findings.  CTA ABDOMEN AND PELVIS FINDINGS VASCULAR Aorta: Normal caliber aorta without aneurysm, dissection, vasculitis or significant stenosis. Diffuse aortic calcification. Celiac: Patent without evidence of aneurysm, dissection, vasculitis or significant stenosis. SMA: Patent without evidence of aneurysm, dissection, vasculitis or significant stenosis. Renals: Prominent calcification at the origin of the renal arteries. Diminished flow to both renal arteries although both arteries remain patent. This is likely to represent proximal renal artery stenosis bilaterally. Nephrograms are symmetrical. IMA: Patent without evidence of aneurysm, dissection, vasculitis or significant stenosis. Inflow: Patent without evidence of aneurysm, dissection, vasculitis or significant stenosis. Veins: There is a right femoral vein catheter with tip in the inferior vena caval atrial junction. Review of the MIP images  confirms the above findings. NON-VASCULAR Hepatobiliary: No focal liver abnormality is seen. No gallstones, gallbladder wall thickening, or biliary dilatation. Pancreas: Unremarkable. No pancreatic ductal dilatation or surrounding inflammatory changes. Spleen: Normal in size without focal abnormality. Adrenals/Urinary Tract: No adrenal gland nodules. Bilateral renal parenchymal atrophy with diminished nephrograms consistent with chronic renal disease, likely vascular. No hydronephrosis or hydroureter. Bladder is decompressed. Stomach/Bowel: Stomach is within normal limits. Appendix appears normal. No evidence of bowel wall thickening, distention, or inflammatory changes. Lymphatic: No significant lymphadenopathy. Reproductive: Prostate is unremarkable. Other: No abdominal wall hernia or abnormality. No abdominopelvic ascites. Musculoskeletal: Degenerative changes in the spine. No destructive bone lesions. Review of the MIP images confirms the above findings. IMPRESSION: 1. No evidence of aneurysm or dissection involving the  thoracic or abdominal aorta. No evidence of significant pulmonary embolus. 2. Extensive aortic and branch vessel calcifications. Coronary artery calcifications. 3. Bilateral renal atrophy with probable bilateral renal artery stenosis. 4. Small right pleural effusion with consolidation in the right lower lung likely representing pneumonia. Electronically Signed   By: Lucienne Capers M.D.   On: 09/18/2017 05:23      Allergies as of 09/19/2017      Reactions   Dust Mite Extract Other (See Comments)   Reaction: unknown      Medication List    TAKE these medications   acetaminophen 500 MG tablet Commonly known as:  TYLENOL Take 500 mg by mouth every 6 (six) hours as needed for mild pain.   albuterol 108 (90 Base) MCG/ACT inhaler Commonly known as:  PROVENTIL HFA;VENTOLIN HFA Inhale 1-2 puffs every 6 (six) hours as needed into the lungs for wheezing or shortness of breath.   amoxicillin-clavulanate 875-125 MG tablet Commonly known as:  AUGMENTIN Take 1 tablet by mouth daily for 5 days.   apixaban 5 MG Tabs tablet Commonly known as:  ELIQUIS Take 1 tablet (5 mg total) by mouth 2 (two) times daily.   aspirin EC 325 MG tablet Take 325 mg by mouth daily.   atorvastatin 10 MG tablet Commonly known as:  LIPITOR Take 10 mg by mouth at bedtime.   budesonide-formoterol 160-4.5 MCG/ACT inhaler Commonly known as:  SYMBICORT Inhale 2 puffs into the lungs 2 (two) times daily. Reported on 08/14/2015   calcium acetate 667 MG capsule Commonly known as:  PHOSLO Take 667 mg by mouth daily.   carvedilol 3.125 MG tablet Commonly known as:  COREG Take 1 tablet (3.125 mg total) by mouth 2 (two) times daily with a meal.   cetirizine 10 MG tablet Commonly known as:  ZYRTEC Take 10 mg by mouth daily.   COMBIVENT RESPIMAT 20-100 MCG/ACT Aers respimat Generic drug:  Ipratropium-Albuterol Inhale 1 puff into the lungs every 6 (six) hours as needed for wheezing or shortness of breath.   docusate  sodium 100 MG capsule Commonly known as:  COLACE Take 100 mg by mouth daily as needed for mild constipation or moderate constipation.   fluticasone 50 MCG/ACT nasal spray Commonly known as:  FLONASE Place 1 spray daily into the nose.   isosorbide mononitrate 30 MG 24 hr tablet Commonly known as:  IMDUR Take 30 mg by mouth daily.   midodrine 10 MG tablet Commonly known as:  PROAMATINE Take 5 mg by mouth 2 (two) times daily.   multivitamin with minerals Tabs tablet Take 1 tablet by mouth 2 (two) times daily.   nitroGLYCERIN 0.4 MG SL tablet Commonly known as:  NITROSTAT Place 0.4 mg under the tongue every 5 (  five) minutes as needed for chest pain.   pantoprazole 20 MG tablet Commonly known as:  PROTONIX Take 20 mg by mouth daily.   patiromer 8.4 g packet Commonly known as:  VELTASSA Take 1 packet (8.4 g total) by mouth daily.   polyethylene glycol packet Commonly known as:  MIRALAX / GLYCOLAX Take 17 g by mouth daily as needed for mild constipation.   pregabalin 75 MG capsule Commonly known as:  LYRICA Take 1 capsule (75 mg total) by mouth daily.   ROBAFEN 100 MG/5ML syrup Generic drug:  guaifenesin Take 200 mg by mouth 3 (three) times daily as needed for cough.   sevelamer carbonate 800 MG tablet Commonly known as:  RENVELA Take 800 mg by mouth 3 (three) times daily with meals.   tiotropium 18 MCG inhalation capsule Commonly known as:  SPIRIVA Place 18 mcg into inhaler and inhale daily.           Management plans discussed with the patient and he is in agreement. Stable for discharge home  Patient should follow up with pcp  CODE STATUS:     Code Status Orders  (From admission, onward)        Start     Ordered   09/18/17 1336  Full code  Continuous     09/18/17 1335    Code Status History    Date Active Date Inactive Code Status Order ID Comments User Context   07/21/2017 1610 07/26/2017 0039 Full Code [id number]  Bettey Costa, MD Inpatient    06/22/2017 2145 06/23/2017 1805 Full Code [id number]  Henreitta Leber, MD Inpatient   04/07/2017 0937 04/10/2017 1828 Full Code [id number]  Eloise Levels, MD ED   01/19/2017 0145 01/21/2017 1941 Full Code [id number]  Lance Coon, MD Inpatient   11/11/2016 0421 11/14/2016 1908 Full Code [id number]  Harrie Foreman, MD Inpatient   10/28/2016 0756 10/30/2016 1930 Full Code [id number]  Demetrios Loll, MD ED   08/21/2016 0408 08/22/2016 1826 DNR [id number]  Harrie Foreman, MD Inpatient   08/03/2016 2319 08/07/2016 1928 DNR [id number]  Lance Coon, MD Inpatient   07/26/2016 1707 07/31/2016 2158 DNR [id number]  Vaughan Basta, MD Inpatient   05/22/2016 0034 05/24/2016 1911 Full Code [id number]  Hugelmeyer, Ubaldo Glassing, DO Inpatient   01/13/2015 0144 01/14/2015 1446 Full Code [id number]  Juluis Mire, MD ED   12/15/2014 0932 12/15/2014 1431 Full Code [id number]  Algernon Huxley, MD Inpatient   05/06/2014 2029 05/10/2014 1723 Full Code [id number]  Elam Dutch, MD Inpatient   05/04/2014 2224 05/06/2014 2029 Full Code [id number]  Rise Patience, MD Inpatient      TOTAL TIME TAKING CARE OF THIS PATIENT: 38 minutes.    Note: This dictation was prepared with Dragon dictation along with smaller phrase technology. Any transcriptional errors that result from this process are unintentional.  Elior Robinette M.D on 09/19/2017 at 10:10 AM  Between 7am to 6pm - Pager - [phone] After 6pm go to [email or web address] - password [email or web address] [facility]  Office  [phone]  CC: Primary care physician; Donnie Coffin, MD

## 2017-09-19 NOTE — Progress Notes (Signed)
Pre HD Assessment    09/19/17 1055  Neurological  Level of Consciousness Alert  Orientation Level Oriented X4  Respiratory  Respiratory Pattern Regular;Unlabored  Chest Assessment Chest expansion symmetrical  Bilateral Breath Sounds Clear;Diminished  Cough None  Cardiac  Pulse Regular  Heart Sounds S1, S2  ECG Monitor No  Vascular  R Radial Pulse +2  L Radial Pulse +2  R Posterior Tibial Pulse +1  GU Assessment  Genitourinary Symptoms  (Pt on hemodialysis. Pt no longer produces urine per pt.)  Psychosocial  Psychosocial (WDL) WDL

## 2017-09-19 NOTE — Progress Notes (Signed)
Pt is being discharged to [facility]. Discharge papers given and explained to pt, verbalized understanding. Meds and f/u appointments reviewed. RX placed in discharge packet. Report given to [employer], Event organiser. Awaiting EMS.

## 2017-09-19 NOTE — Progress Notes (Signed)
PT Cancellation Note  Patient Details Name: [name] MRN: [id number] DOB: 25-Mar-1956   Cancelled Treatment:    Reason Eval/Treat Not Completed: Patient at procedure or test/unavailable(Consult received and chart reviewed.  Patient currently off unit for dialysis.  Will re-attempt at later time/date as medically appropriate and available.)   Landry Kamath H. Owens Shark, PT, DPT, NCS 09/19/17, 11:38 AM [phone]

## 2017-09-19 NOTE — Plan of Care (Signed)
  Problem: Education: Goal: Knowledge of General Education information will improve Outcome: Progressing   Problem: Health Behavior/Discharge Planning: Goal: Ability to manage health-related needs will improve Outcome: Progressing   Problem: Clinical Measurements: Goal: Ability to maintain clinical measurements within normal limits will improve Outcome: Progressing Goal: Will remain free from infection Outcome: Progressing   Problem: Coping: Goal: Level of anxiety will decrease Outcome: Progressing   Problem: Skin Integrity: Goal: Risk for impaired skin integrity will decrease Outcome: Progressing

## 2017-09-19 NOTE — Progress Notes (Signed)
Patient sitting on bed, refused bed alarm. Patient stated he will call for assistance if needed, but is only wanting to sit up.

## 2017-09-19 NOTE — Progress Notes (Signed)
Pre HD Tx   09/19/17 1053  Vital Signs  Pulse Rate 77  Resp 17  BP 127/76  BP Location Right Arm  BP Method Automatic  Patient Position (if appropriate) Lying  Oxygen Therapy  SpO2 95 %  O2 Device Nasal Cannula  O2 Flow Rate (L/min) 2 L/min  Pulse Oximetry Type Continuous  Pain Assessment  Pain Scale 0-10  Pain Score 0  Dialysis Weight  Weight 88.6 kg (195 lb 5.2 oz)  Type of Weight Pre-Dialysis  Time-Out for Hemodialysis  What Procedure? HD  Pt Identifiers(min of two) First/Last Name;MRN/Account#  Correct Site? Yes  Correct Side? Yes  Correct Procedure? Yes  Consents Verified? Yes  Rad Studies Available? N/A  Safety Precautions Reviewed? Yes  Pre Treatment Patient Checks  Vascular access used during treatment Catheter  Hepatitis B Surface Antigen Results Negative  Date Hepatitis B Surface Antigen Drawn 09/18/17  Hepatitis B Surface Antibody  (unknown)  Hemodialysis Consent Verified Yes  Hemodialysis Standing Orders Initiated Yes  ECG (Telemetry) Monitor On Yes  Prime Ordered Normal Saline  Length of  DialysisTreatment -hour(s) 3.5 Hour(s)  Dialysis Treatment Comments Na 140  Dialyzer Elisio 17H NR  Dialysate 2K, 2.5 Ca  Dialysis Anticoagulant None  Dialysate Flow Ordered 600  Blood Flow Rate Ordered 400 mL/min  Ultrafiltration Goal 2 Liters  Pre Treatment Labs CBC  Education / Care Plan  Dialysis Education Provided Yes  Documented Education in Care Plan Yes

## 2017-09-19 NOTE — Progress Notes (Signed)
Post HD Tx    09/19/17 1400  Vital Signs  Temp 98.2 F (36.8 C)  Temp Source Oral  Pulse Rate 72  Pulse Rate Source Monitor  Resp 18  BP 132/80  BP Location Left Arm  BP Method Automatic  Patient Position (if appropriate) Lying  Oxygen Therapy  SpO2 100 %  O2 Device Nasal Cannula  O2 Flow Rate (L/min) 2 L/min  Pulse Oximetry Type Continuous  Post-Hemodialysis Assessment  Rinseback Volume (mL) 250 mL  Dialyzer Clearance Lightly streaked  Duration of HD Treatment -hour(s) 3 hour(s)  Hemodialysis Intake (mL) 500 mL  UF Total -Machine (mL) 2557 mL  Net UF (mL) 2057 mL  Tolerated HD Treatment Yes  Post-Hemodialysis Comments pt stable

## 2017-09-19 NOTE — NC FL2 (Signed)
[place] LEVEL OF CARE SCREENING TOOL     IDENTIFICATION  Patient Name: [name] Birthdate: [birth year] Sex: male Admission Date (Current Location): 09/18/2017  [place] and [place] Number:  [name] ([id number]) Facility and Address:  [facility], [address], [place], [place] [address]      Provider Number: [id number]  Attending Physician Name and Address:  Bettey Costa, MD  Relative Name and Phone Number:       Current Level of Care: Hospital Recommended Level of Care: Assisted Living Facility([facility]) Prior Approval Number:    Date Approved/Denied:   PASRR Number:    Discharge Plan: Domiciliary (Rest home)([facility])    Current Diagnoses: Patient Active Problem List   Diagnosis Date Noted  . Pneumonia 09/18/2017  . Apnea   . End-stage renal disease on hemodialysis ([place])   . Hypervolemia   . Respiratory distress   . Altered mental status   . Acute on chronic systolic CHF (congestive heart failure) ([place]) 01/18/2017  . Respiratory failure with hypoxia ([place]) 11/11/2016  . Acute respiratory failure ([place]) 10/28/2016  . Pulmonary embolism ([place]) 08/03/2016  . NSTEMI (non-ST elevated myocardial infarction) ([place]) 07/26/2016  . Acute respiratory failure with hypoxia ([place]) 05/24/2016  . Right lower lobe pneumonia ([place]) 05/24/2016  . Elevated troponin 05/24/2016  . Pulmonary edema 05/21/2016  . Kidney dialysis as the cause of abnormal reaction of the patient, or of later complication, without mention of misadventure at the time of the procedure (CODE) 03/05/2016  . PVD (peripheral vascular disease) ([place]) 03/05/2016  . Preop examination 05/02/2015  . Chronic systolic heart failure ([place])   . Sepsis ([employer]) 01/13/2015  . Acute encephalopathy 01/13/2015  . Hyperkalemia 01/13/2015  . Gangrene of foot ([place]) 05/04/2014  . ESRD (end stage renal disease) on dialysis ([place]) 05/04/2014  . CAD (coronary artery disease) 05/04/2014   . Normocytic anemia 05/04/2014  . Hyperlipidemia 04/26/2010  . HYPERTENSION, BENIGN 04/26/2010  . CAD, NATIVE VESSEL 04/26/2010    Orientation RESPIRATION BLADDER Height & Weight     Self, Time, Situation, Place  PRN 2 liters oxygen  Continent Weight: Estate manager/land agent patient) Height:  6\' 2"  (188 cm)  BEHAVIORAL SYMPTOMS/MOOD NEUROLOGICAL BOWEL NUTRITION STATUS      Continent Diet(renal with fluid restriction Fluid restriction: 1200 mL Fluid)  AMBULATORY STATUS COMMUNICATION OF NEEDS Skin   Limited Assist Verbally Normal                       Personal Care Assistance Level of Assistance  Bathing, Feeding, Dressing Bathing Assistance: Limited assistance Feeding assistance: Independent Dressing Assistance: Limited assistance     Functional Limitations Info  Sight, Hearing, Speech Sight Info: Adequate Hearing Info: Adequate Speech Info: Adequate    SPECIAL CARE FACTORS FREQUENCY  PT Dialysis patient MWF [facility]     PT Frequency: (2-3 Home Health)             Contractures      Additional Factors Info  Code Status, Allergies Code Status Info: (Full Code) Allergies Info: (DUST MITE EXTRACT )          Discharge Medications: Please see discharge summary for a list of discharge medications. Medication List    TAKE these medications   acetaminophen 500 MG tablet Commonly known as:  TYLENOL Take 500 mg by mouth every 6 (six) hours as needed for mild pain.   albuterol 108 (90 Base) MCG/ACT inhaler Commonly known  as:  PROVENTIL HFA;VENTOLIN HFA Inhale 1-2 puffs every 6 (six) hours as needed into the lungs for wheezing or shortness of breath.   amoxicillin-clavulanate 875-125 MG tablet Commonly known as:  AUGMENTIN Take 1 tablet by mouth daily for 5 days.   apixaban 5 MG Tabs tablet Commonly known as:  ELIQUIS Take 1 tablet (5 mg total) by mouth 2 (two) times daily.   aspirin EC 325 MG tablet Take 325 mg by mouth daily.    atorvastatin 10 MG tablet Commonly known as:  LIPITOR Take 10 mg by mouth at bedtime.   budesonide-formoterol 160-4.5 MCG/ACT inhaler Commonly known as:  SYMBICORT Inhale 2 puffs into the lungs 2 (two) times daily. Reported on 08/14/2015   calcium acetate 667 MG capsule Commonly known as:  PHOSLO Take 667 mg by mouth daily.   carvedilol 3.125 MG tablet Commonly known as:  COREG Take 1 tablet (3.125 mg total) by mouth 2 (two) times daily with a meal.   cetirizine 10 MG tablet Commonly known as:  ZYRTEC Take 10 mg by mouth daily.   COMBIVENT RESPIMAT 20-100 MCG/ACT Aers respimat Generic drug:  Ipratropium-Albuterol Inhale 1 puff into the lungs every 6 (six) hours as needed for wheezing or shortness of breath.   docusate sodium 100 MG capsule Commonly known as:  COLACE Take 100 mg by mouth daily as needed for mild constipation or moderate constipation.   fluticasone 50 MCG/ACT nasal spray Commonly known as:  FLONASE Place 1 spray daily into the nose.   isosorbide mononitrate 30 MG 24 hr tablet Commonly known as:  IMDUR Take 30 mg by mouth daily.   midodrine 10 MG tablet Commonly known as:  PROAMATINE Take 5 mg by mouth 2 (two) times daily.   multivitamin with minerals Tabs tablet Take 1 tablet by mouth 2 (two) times daily.   nitroGLYCERIN 0.4 MG SL tablet Commonly known as:  NITROSTAT Place 0.4 mg under the tongue every 5 (five) minutes as needed for chest pain.   pantoprazole 20 MG tablet Commonly known as:  PROTONIX Take 20 mg by mouth daily.   patiromer 8.4 g packet Commonly known as:  VELTASSA Take 1 packet (8.4 g total) by mouth daily.   polyethylene glycol packet Commonly known as:  MIRALAX / GLYCOLAX Take 17 g by mouth daily as needed for mild constipation.   pregabalin 75 MG capsule Commonly known as:  LYRICA Take 1 capsule (75 mg total) by mouth daily.   ROBAFEN 100 MG/5ML syrup Generic drug:  guaifenesin Take 200 mg by mouth 3  (three) times daily as needed for cough.   sevelamer carbonate 800 MG tablet Commonly known as:  RENVELA Take 800 mg by mouth 3 (three) times daily with meals.   tiotropium 18 MCG inhalation capsule Commonly known as:  SPIRIVA Place 18 mcg into inhaler and inhale daily.     Relevant Imaging Results: Relevant Lab Results: Additional Information (SSN: [id number])  Edi Gorniak, Veronia Beets, LCSW

## 2017-09-20 LAB — HIV ANTIBODY (ROUTINE TESTING W REFLEX): HIV SCREEN 4TH GENERATION: NONREACTIVE

## 2017-09-23 LAB — CULTURE, BLOOD (ROUTINE X 2)
Culture: NO GROWTH
Culture: NO GROWTH
SPECIAL REQUESTS: ADEQUATE
Special Requests: ADEQUATE

## 2017-09-23 NOTE — Progress Notes (Deleted)
   Patient ID: [name], male    DOB: 1955/10/09, 62 y.o.   MRN: [id number]  HPI  [name] is a 62 y/o male with a history of  Echo report from 07/23/17 reviewed and showed an EF of 20-25%.  Admitted 09/18/17 due to pneumonia. Initially given IV antibiotics and then transitioned to oral antibiotics at discharge. Discharged the following day. Admitted 07/21/17 due to acute on chronic respiratory failure. Given oral steroids due to acute bronchitis. Received dialysis while inpatient. ID and vascular consults were obtained. MSSA bacteremia was noted due to IJ dialysis catheter which was subsequently removed. Elevated troponin thought to be due to demand ischemia. Discharged after 4 days.   He presents today for his initial visit with a chief complaint of  Review of Systems    Physical Exam  Assessment & Plan:  1: Chronic heart failure with reduced ejection fraction- - NYHA class - saw cardiology Lyndel Pleasure) 07/31/17 - saw pulmonologist Ashby Dawes) 07/15/17 - BNP on 09/18/17 was 754.0  2: HTN- - BMP on 09/19/17 reviewed and showed sodium 137, potassium 5.2 and GFR 9  3: ESRD- - receives dialysis M, W, F - saw vascular Judithann Sheen) 08/05/17

## 2017-09-25 ENCOUNTER — Ambulatory Visit: Payer: Medicare Other | Admitting: Family

## 2017-09-28 ENCOUNTER — Encounter: Payer: Self-pay | Admitting: Internal Medicine

## 2017-09-30 ENCOUNTER — Ambulatory Visit (INDEPENDENT_AMBULATORY_CARE_PROVIDER_SITE_OTHER): Payer: Medicare Other | Admitting: Vascular Surgery

## 2017-09-30 ENCOUNTER — Encounter (INDEPENDENT_AMBULATORY_CARE_PROVIDER_SITE_OTHER): Payer: Medicare Other

## 2017-10-04 NOTE — Progress Notes (Signed)
Patient ID: [name], male    DOB: 1956-04-08, 62 y.o.   MRN: [id number]  HPI  [name] is a 62 y/o male with a history of asthma, CAD, hyperlipidemia, HTN, CKD (ESRD on dialysis), anemia, COPD, GERD, PVD, MI, obstructive sleep apnea, left BKA, current tobacco use and chronic heart failure.   Echo report from 07/23/17 reviewed and showed an EF of 20-25%.  Admitted 09/18/17 due to pneumonia. Initially given IV antibiotics and then transitioned to oral antibiotics at discharge. Discharged the following day. Admitted 07/21/17 due to acute on chronic respiratory failure. Given oral steroids due to acute bronchitis. Received dialysis while inpatient. ID and vascular consults were obtained. MSSA bacteremia was noted due to IJ dialysis catheter which was subsequently removed. Elevated troponin thought to be due to demand ischemia. Discharged after 4 days.   He presents today for his initial visit with a chief complaint of moderate fatigue upon minimal exertion. He says that this has been chronic in nature having been present for several years. He has associated shortness of breath, difficulty sleeping and left lower leg phantom pain. He denies any abdominal distention, palpitations, right lower leg edema, chest pain, wheezing or dizziness. Unable to safely be weighed due to left BKA.   Past Medical History:  Diagnosis Date  . 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 Amputee, below knee, left ([place])   . Anemia   . Asthma   . Cardiomyopathy ([place])   . CHF (congestive heart failure) ([place])   . Chronic systolic heart failure ([place])   . Complication of anesthesia    hypotension  . COPD (chronic obstructive pulmonary disease) ([place])   . Coronary artery disease   . Dialysis patient ([place])    Mon, Wed, Fri  . End stage renal disease ([place])   . GERD (gastroesophageal reflux disease)   . Headache   . History of kidney stones   . History of pulmonary embolism   . HLD (hyperlipidemia)   . HTN  (hypertension)   . Hyperparathyroidism   . Myocardial infarction ([place])   . Peripheral vascular disease ([place])   . Shortness of breath dyspnea   . Sleep apnea    NO C-PAP, Patient stated in process of  "getting one"   . Tobacco dependence    Past Surgical History:  Procedure Laterality Date  . A/V FISTULAGRAM Left 08/14/2017   Procedure: A/V FISTULAGRAM;  Surgeon: Algernon Huxley, MD;  Location: [facility];  Service: Cardiovascular;  Laterality: Left;  . AMPUTATION Left 05/06/2014   Procedure: AMPUTATION BELOW KNEE;  Surgeon: Elam Dutch, MD;  Location: [place];  Service: Vascular;  Laterality: Left;  . AMPUTATION Right 01/12/2015   Procedure: Foot transmetatarsal amputation;  Surgeon: Algernon Huxley, MD;  Location: [facility];  Service: Vascular;  Laterality: Right;  . APPLICATION OF WOUND VAC Right 03/01/2015   Procedure: Application of Bio-connekt graft and wound vac application to right foot ;  Surgeon: Algernon Huxley, MD;  Location: [facility];  Service: Vascular;  Laterality: Right;  . AV FISTULA PLACEMENT Left   . AV FISTULA PLACEMENT Left 11/28/2016   Procedure: ARTERIOVENOUS (AV) FISTULA CREATION;  Surgeon: Algernon Huxley, MD;  Location: [facility];  Service: Vascular;  Laterality: Left;  . CARDIAC CATHETERIZATION     stent placement   . CORONARY ANGIOPLASTY    . DIALYSIS/PERMA CATHETER INSERTION N/A 07/31/2016   Procedure: Dialysis/Perma Catheter Insertion;  Surgeon: Erskine Squibb  Lucky Cowboy, MD;  Location: [facility];  Service: Cardiovascular;  Laterality: N/A;  . DIALYSIS/PERMA CATHETER INSERTION N/A 07/25/2017   Procedure: DIALYSIS/PERMA CATHETER INSERTION and fistulagram;  Surgeon: Algernon Huxley, MD;  Location: [facility];  Service: Cardiovascular;  Laterality: N/A;  . DIALYSIS/PERMA CATHETER REMOVAL N/A 07/22/2017   Procedure: DIALYSIS/PERMA CATHETER REMOVAL;  Surgeon: Katha Cabal, MD;  Location: [facility];  Service: Cardiovascular;  Laterality: N/A;  . IR FLUORO  GUIDE CV LINE LEFT  04/10/2017  . LIGATION OF ARTERIOVENOUS  FISTULA Right 01/31/2016   Procedure: LIGATION OF ARTERIOVENOUS  FISTULA;  Surgeon: Algernon Huxley, MD;  Location: [facility];  Service: Vascular;  Laterality: Right;  . PERIPHERAL VASCULAR CATHETERIZATION Right 12/15/2014   Procedure: Lower Extremity Angiography;  Surgeon: Algernon Huxley, MD;  Location: [facility];  Service: Cardiovascular;  Laterality: Right;  . PERIPHERAL VASCULAR CATHETERIZATION  12/15/2014   Procedure: Lower Extremity Intervention;  Surgeon: Algernon Huxley, MD;  Location: [facility];  Service: Cardiovascular;;  . PERIPHERAL VASCULAR CATHETERIZATION Right 08/14/2015   Procedure: A/V Shuntogram/Fistulagram;  Surgeon: Algernon Huxley, MD;  Location: [facility];  Service: Cardiovascular;  Laterality: Right;  . PERIPHERAL VASCULAR CATHETERIZATION N/A 08/14/2015   Procedure: A/V Shunt Intervention;  Surgeon: Algernon Huxley, MD;  Location: [facility];  Service: Cardiovascular;  Laterality: N/A;  . PERIPHERAL VASCULAR CATHETERIZATION N/A 01/11/2016   Procedure: Dialysis/Perma Catheter Insertion;  Surgeon: Algernon Huxley, MD;  Location: [facility];  Service: Cardiovascular;  Laterality: N/A;  . REVISON OF ARTERIOVENOUS FISTULA Right 02/17/2016   Procedure: removal of AV fistula;  Surgeon: Serafina Mitchell, MD;  Location: [facility];  Service: Vascular;  Laterality: Right;  . REVISON OF ARTERIOVENOUS FISTULA Right 01/31/2016   Procedure: REVISON OF ARTERIOVENOUS FISTULA ( BRACHIOCEPHALIC ) W/ ARTEGRAFT;  Surgeon: Algernon Huxley, MD;  Location: [facility];  Service: Vascular;  Laterality: Right;  . TRANSMETATARSAL AMPUTATION Right 05/04/2015   Procedure: TRANSMETATARSAL AMPUTATION REVISION, great toe amputation;  Surgeon: Algernon Huxley, MD;  Location: [facility];  Service: Vascular;  Laterality: Right;   Family History  Problem Relation Age of Onset  . Leukemia Mother   . Heart attack Father   . Heart failure Other    . Hypertension Other   . Leukemia Other   . Diabetes Other   . Prostate cancer Neg Hx   . Kidney cancer Neg Hx   . Bladder Cancer Neg Hx    Social History   Tobacco Use  . Smoking status: Light Tobacco Smoker    Packs/day: 0.25    Years: 30.00    Pack years: 7.50    Types: Cigarettes  . Smokeless tobacco: Never Used  . Tobacco comment: 5  Substance Use Topics  . Alcohol use: No    Alcohol/week: 0.0 oz   Allergies  Allergen Reactions  . Dust Mite Extract Other (See Comments)    Reaction: unknown   Prior to Admission medications   Medication Sig Start Date End Date Taking? Authorizing Provider  acetaminophen (TYLENOL) 500 MG tablet Take 500 mg by mouth every 6 (six) hours as needed for mild pain.    Yes [provider]  albuterol (PROVENTIL HFA;VENTOLIN HFA) 108 (90 Base) MCG/ACT inhaler Inhale 1-2 puffs every 6 (six) hours as needed into the lungs for wheezing or shortness of breath.   Yes [provider]  apixaban (ELIQUIS) 5 MG TABS  tablet Take 1 tablet (5 mg total) by mouth 2 (two) times daily. 01/20/17  Yes Gladstone Lighter, MD  aspirin EC 325 MG tablet Take 325 mg by mouth daily.    Yes [provider]  atorvastatin (LIPITOR) 10 MG tablet Take 10 mg by mouth at bedtime.    Yes [provider]  calcium acetate (PHOSLO) 667 MG capsule Take 667 mg by mouth daily.    Yes [provider]  carvedilol (COREG) 3.125 MG tablet Take 1 tablet (3.125 mg total) by mouth 2 (two) times daily with a meal. 01/20/17  Yes Gladstone Lighter, MD  cetirizine (ZYRTEC) 10 MG tablet Take 10 mg by mouth daily.   Yes [provider]  docusate sodium (COLACE) 100 MG capsule Take 100 mg by mouth daily as needed for mild constipation or moderate constipation.    Yes [provider]  fluticasone (FLONASE) 50 MCG/ACT nasal spray Place 1 spray daily into the nose.    Yes [provider]  guaifenesin (ROBAFEN) 100 MG/5ML syrup Take  200 mg by mouth 3 (three) times daily as needed for cough.   Yes [provider]  hydrocortisone 2.5 % cream Apply 1 application topically as needed.   Yes [provider]  Ipratropium-Albuterol (COMBIVENT RESPIMAT) 20-100 MCG/ACT AERS respimat Inhale 1 puff into the lungs every 6 (six) hours as needed for wheezing or shortness of breath.   Yes [provider]  isosorbide mononitrate (IMDUR) 30 MG 24 hr tablet Take 30 mg by mouth daily.  08/22/16  Yes [provider]  midodrine (PROAMATINE) 10 MG tablet Take 5 mg by mouth 2 (two) times daily.   Yes [provider]  Multiple Vitamin (MULTIVITAMIN WITH MINERALS) TABS tablet Take 1 tablet by mouth 2 (two) times daily.   Yes [provider]  nitroGLYCERIN (NITROSTAT) 0.4 MG SL tablet Place 0.4 mg under the tongue every 5 (five) minutes as needed for chest pain.   Yes [provider]  pantoprazole (PROTONIX) 20 MG tablet Take 20 mg by mouth daily.   Yes [provider]  patiromer (VELTASSA) 8.4 g packet Take 1 packet (8.4 g total) by mouth daily. 07/26/17  Yes Mody, Ulice Bold, MD  polyethylene glycol (MIRALAX / GLYCOLAX) packet Take 17 g by mouth daily as needed for mild constipation.    Yes [provider]  pregabalin (LYRICA) 75 MG capsule Take 1 capsule (75 mg total) by mouth daily. 01/22/17  Yes Gladstone Lighter, MD  sevelamer carbonate (RENVELA) 800 MG tablet Take 800 mg by mouth 3 (three) times daily with meals.    Yes [provider]  tiotropium (SPIRIVA) 18 MCG inhalation capsule Place 18 mcg into inhaler and inhale daily.   Yes [provider]    Review of Systems  Constitutional: Positive for fatigue (tire easily). Negative for appetite change.  HENT: Negative for congestion, postnasal drip and sore throat.   Eyes: Negative.   Respiratory: Positive for shortness of breath (wearing oxygen at 3L at bedtime and PRN during the day). Negative for chest  tightness and wheezing.   Cardiovascular: Negative for chest pain, palpitations and leg swelling.  Gastrointestinal: Negative for abdominal distention and abdominal pain.  Endocrine: Negative.   Genitourinary: Negative.   Musculoskeletal: Positive for arthralgias (left leg phantom pain).  Skin: Negative.   Allergic/Immunologic: Negative.   Neurological: Positive for weakness (right leg). Negative for dizziness and light-headedness.  Hematological: Negative for adenopathy. Does not bruise/bleed easily.  Psychiatric/Behavioral: Positive for sleep  disturbance (due to left leg phantom pain; wearing CPAP nightly). Negative for dysphoric mood. The patient is not nervous/anxious.    Vitals:   10/07/17 1300  BP: (!) 150/77  Pulse: (!) 105  Resp: 18  SpO2: 97%  Height: 6\' 2"  (1.88 m)   Wt Readings from Last 3 Encounters:  09/19/17 190 lb 6.4 oz (86.4 kg)  08/14/17 210 lb (95.3 kg)  08/05/17 195 lb (88.5 kg)   Lab Results  Component Value Date   CREATININE 6.80 (H) 09/19/2017   CREATININE 7.48 (H) 09/18/2017   CREATININE 13.68 (H) 07/25/2017    Physical Exam  Constitutional: He is oriented to person, place, and time. He appears well-developed and well-nourished.  HENT:  Head: Normocephalic and atraumatic.  Neck: Normal range of motion. Neck supple. No JVD present.  Cardiovascular: Regular rhythm. Tachycardia present.  Pulmonary/Chest: Effort normal. He has no wheezes. He has no rales.  Abdominal: Soft. He exhibits no distension. There is no tenderness.  Musculoskeletal: He exhibits no edema or tenderness.  Neurological: He is alert and oriented to person, place, and time.  Skin: Skin is warm and dry.  Psychiatric: He has a normal mood and affect. His behavior is normal. Thought content normal.  Nursing note and vitals reviewed.  Assessment & Plan:  1: Chronic heart failure with reduced ejection fraction- - NYHA class III - euvolemic today - not being weighed due to left BKA  and weakness in his right lower leg being unable to hold him up - not adding salt to his food and he says that [employer] with salt either - is receiving physical therapy daily to assist with walking - saw cardiology Lyndel Pleasure) 07/31/17 - saw pulmonologist Ashby Dawes) 07/15/17 - will increase his carvedilol to 6.25mg  BID which should also help with tachycardia - discussed changing his isosorbide to BiDil at his next visit - due to history of hyperkalemia, doubtful we can use entresto - BNP on 09/18/17 was 754.0  2: HTN- - BP mildly elevated today; continue to monitor - sees PCP Clide Deutscher) at [facility] on 09/19/17 reviewed and showed sodium 137, potassium 5.2 and GFR 9  3: ESRD- - receives dialysis M, W, F - saw vascular Judithann Sheen) 08/05/17  4: Tobacco use- - smoking 4 cigarettes daily - says that he quit in the past for 3 months but resumed due to stress - complete cessation discussed for 3 minutes with him  Facility medication list was reviewed.  Return in 6 weeks or sooner for any questions/problems before then.

## 2017-10-07 ENCOUNTER — Encounter: Payer: Self-pay | Admitting: Family

## 2017-10-07 ENCOUNTER — Ambulatory Visit: Payer: Medicare Other | Attending: Family | Admitting: Family

## 2017-10-07 VITALS — BP 150/77 | HR 105 | Resp 18 | Ht 74.0 in

## 2017-10-07 DIAGNOSIS — E785 Hyperlipidemia, unspecified: Secondary | ICD-10-CM | POA: Insufficient documentation

## 2017-10-07 DIAGNOSIS — N186 End stage renal disease: Secondary | ICD-10-CM

## 2017-10-07 DIAGNOSIS — G4733 Obstructive sleep apnea (adult) (pediatric): Secondary | ICD-10-CM | POA: Insufficient documentation

## 2017-10-07 DIAGNOSIS — Z720 Tobacco use: Secondary | ICD-10-CM | POA: Insufficient documentation

## 2017-10-07 DIAGNOSIS — E875 Hyperkalemia: Secondary | ICD-10-CM | POA: Insufficient documentation

## 2017-10-07 DIAGNOSIS — I10 Essential (primary) hypertension: Secondary | ICD-10-CM

## 2017-10-07 DIAGNOSIS — I132 Hypertensive heart and chronic kidney disease with heart failure and with stage 5 chronic kidney disease, or end stage renal disease: Secondary | ICD-10-CM | POA: Insufficient documentation

## 2017-10-07 DIAGNOSIS — F17210 Nicotine dependence, cigarettes, uncomplicated: Secondary | ICD-10-CM | POA: Insufficient documentation

## 2017-10-07 DIAGNOSIS — J449 Chronic obstructive pulmonary disease, unspecified: Secondary | ICD-10-CM | POA: Diagnosis not present

## 2017-10-07 DIAGNOSIS — Z87442 Personal history of urinary calculi: Secondary | ICD-10-CM | POA: Diagnosis not present

## 2017-10-07 DIAGNOSIS — Z992 Dependence on renal dialysis: Secondary | ICD-10-CM | POA: Diagnosis not present

## 2017-10-07 DIAGNOSIS — I959 Hypotension, unspecified: Secondary | ICD-10-CM | POA: Insufficient documentation

## 2017-10-07 DIAGNOSIS — Z89512 Acquired absence of left leg below knee: Secondary | ICD-10-CM | POA: Diagnosis not present

## 2017-10-07 DIAGNOSIS — I5022 Chronic systolic (congestive) heart failure: Secondary | ICD-10-CM | POA: Diagnosis not present

## 2017-10-07 DIAGNOSIS — Z7901 Long term (current) use of anticoagulants: Secondary | ICD-10-CM | POA: Insufficient documentation

## 2017-10-07 DIAGNOSIS — J45909 Unspecified asthma, uncomplicated: Secondary | ICD-10-CM | POA: Diagnosis not present

## 2017-10-07 DIAGNOSIS — Z7982 Long term (current) use of aspirin: Secondary | ICD-10-CM | POA: Insufficient documentation

## 2017-10-07 DIAGNOSIS — I739 Peripheral vascular disease, unspecified: Secondary | ICD-10-CM | POA: Diagnosis not present

## 2017-10-07 DIAGNOSIS — I252 Old myocardial infarction: Secondary | ICD-10-CM | POA: Insufficient documentation

## 2017-10-07 DIAGNOSIS — Z79899 Other long term (current) drug therapy: Secondary | ICD-10-CM | POA: Diagnosis not present

## 2017-10-07 DIAGNOSIS — I429 Cardiomyopathy, unspecified: Secondary | ICD-10-CM | POA: Diagnosis not present

## 2017-10-07 DIAGNOSIS — K219 Gastro-esophageal reflux disease without esophagitis: Secondary | ICD-10-CM | POA: Diagnosis not present

## 2017-10-07 DIAGNOSIS — I2510 Atherosclerotic heart disease of native coronary artery without angina pectoris: Secondary | ICD-10-CM | POA: Insufficient documentation

## 2017-10-07 DIAGNOSIS — R5383 Other fatigue: Secondary | ICD-10-CM | POA: Insufficient documentation

## 2017-10-07 DIAGNOSIS — Z86711 Personal history of pulmonary embolism: Secondary | ICD-10-CM | POA: Diagnosis not present

## 2017-10-07 MED ORDER — CARVEDILOL 6.25 MG PO TABS: 6.2500 mg | ORAL_TABLET | Freq: Two times a day (BID) | ORAL | 3 refills | Status: DC

## 2017-10-07 NOTE — Patient Instructions (Addendum)
Continue weighing daily and call for an overnight weight gain of > 2 pounds or a weekly weight gain of >5 pounds.  Clean CPAP equipment monthly per recommendations

## 2017-10-13 ENCOUNTER — Encounter: Payer: Self-pay | Admitting: Internal Medicine

## 2017-10-16 ENCOUNTER — Encounter (INDEPENDENT_AMBULATORY_CARE_PROVIDER_SITE_OTHER): Payer: Self-pay

## 2017-10-21 ENCOUNTER — Other Ambulatory Visit (INDEPENDENT_AMBULATORY_CARE_PROVIDER_SITE_OTHER): Payer: Self-pay | Admitting: Vascular Surgery

## 2017-10-22 MED ORDER — CEFAZOLIN SODIUM-DEXTROSE 1-4 GM/50ML-% IV SOLN: 1.0000 g | Freq: Once | INTRAVENOUS | Status: DC

## 2017-10-23 ENCOUNTER — Encounter: Admission: RE | Payer: Self-pay | Source: Ambulatory Visit

## 2017-10-23 ENCOUNTER — Ambulatory Visit: Admission: RE | Admit: 2017-10-23 | Payer: Medicare Other | Source: Ambulatory Visit | Admitting: Vascular Surgery

## 2017-10-23 SURGERY — DIALYSIS/PERMA CATHETER REMOVAL
Anesthesia: LOCAL

## 2017-10-25 ENCOUNTER — Emergency Department
Admission: EM | Admit: 2017-10-25 | Discharge: 2017-10-25 | Disposition: A | Discharge status: Home / Self Care | Payer: Medicare Other | Attending: Emergency Medicine | Admitting: Emergency Medicine

## 2017-10-25 ENCOUNTER — Encounter: Payer: Self-pay | Admitting: Emergency Medicine

## 2017-10-25 ENCOUNTER — Other Ambulatory Visit: Payer: Self-pay

## 2017-10-25 DIAGNOSIS — I2510 Atherosclerotic heart disease of native coronary artery without angina pectoris: Secondary | ICD-10-CM | POA: Diagnosis not present

## 2017-10-25 DIAGNOSIS — F17210 Nicotine dependence, cigarettes, uncomplicated: Secondary | ICD-10-CM | POA: Diagnosis not present

## 2017-10-25 DIAGNOSIS — I132 Hypertensive heart and chronic kidney disease with heart failure and with stage 5 chronic kidney disease, or end stage renal disease: Secondary | ICD-10-CM | POA: Diagnosis not present

## 2017-10-25 DIAGNOSIS — H1131 Conjunctival hemorrhage, right eye: Secondary | ICD-10-CM

## 2017-10-25 DIAGNOSIS — Z992 Dependence on renal dialysis: Secondary | ICD-10-CM | POA: Insufficient documentation

## 2017-10-25 DIAGNOSIS — I252 Old myocardial infarction: Secondary | ICD-10-CM | POA: Diagnosis not present

## 2017-10-25 DIAGNOSIS — I5022 Chronic systolic (congestive) heart failure: Secondary | ICD-10-CM | POA: Diagnosis not present

## 2017-10-25 DIAGNOSIS — Z7901 Long term (current) use of anticoagulants: Secondary | ICD-10-CM | POA: Diagnosis not present

## 2017-10-25 DIAGNOSIS — Z79899 Other long term (current) drug therapy: Secondary | ICD-10-CM | POA: Diagnosis not present

## 2017-10-25 DIAGNOSIS — Z7982 Long term (current) use of aspirin: Secondary | ICD-10-CM | POA: Diagnosis not present

## 2017-10-25 DIAGNOSIS — J449 Chronic obstructive pulmonary disease, unspecified: Secondary | ICD-10-CM | POA: Diagnosis not present

## 2017-10-25 DIAGNOSIS — H5789 Other specified disorders of eye and adnexa: Secondary | ICD-10-CM | POA: Diagnosis present

## 2017-10-25 DIAGNOSIS — N186 End stage renal disease: Secondary | ICD-10-CM | POA: Diagnosis not present

## 2017-10-25 LAB — PROTIME-INR
INR: 1.35
PROTHROMBIN TIME: 16.6 s — AB (ref 11.4–15.2)

## 2017-10-25 NOTE — ED Notes (Signed)
Pt states was rubbing eye and started feeling "weird". Pt states when he looked in the mirror, he noticed a small amount of blood beside cornea. Once he arrived, the pt states the amount of blood in eye was increased. Pt states some blurry vision but can still see the outline of most objects. No N/V.

## 2017-10-25 NOTE — ED Notes (Signed)
Informed Designer, fashion/clothing that we no longer need transport for patient.

## 2017-10-25 NOTE — ED Notes (Signed)
Pt called sister and she was able to come and pick him up. Pt verbalized discharge instructions and has no questions at this time.

## 2017-10-25 NOTE — Discharge Instructions (Signed)
Do not take your Eliquis for the next 2 days. Call and schedule an appointment with [facility] for Monday or Tuesday. Return to the ER if your vision worsens or you develop worsening eye pain.

## 2017-10-25 NOTE — ED Provider Notes (Signed)
[facility] Emergency Department Provider Note ____________________________________________  Time seen: Approximately 12:44 PM  I have reviewed the triage vital signs and the nursing notes.   HISTORY  Chief Complaint Eye Problem   HPI PELHAM HENNICK is a 62 y.o. male who presents to the emergency department for treatment and evaluation of right eye irritation.  Patient states that he awakened and noticed that his eye was swollen.  He states that he rubbed it and noticed that it felt irritated. No known injury.  He is on Eliquis for DVT/PE prevention.  He is a dialysis patient who had dialysis yesterday.  He states that he can see out of the right eye, but it is little more blurry than usual.  Past Medical History:  Diagnosis Date  . 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 Amputee, below knee, left ([place])   . Anemia   . Asthma   . Cardiomyopathy ([place])   . CHF (congestive heart failure) ([place])   . Chronic systolic heart failure ([place])   . Complication of anesthesia    hypotension  . COPD (chronic obstructive pulmonary disease) ([place])   . Coronary artery disease   . Dialysis patient ([place])    Mon, Wed, Fri  . End stage renal disease ([place])   . GERD (gastroesophageal reflux disease)   . Headache   . History of kidney stones   . History of pulmonary embolism   . HLD (hyperlipidemia)   . HTN (hypertension)   . Hyperparathyroidism   . Myocardial infarction ([place])   . Peripheral vascular disease ([place])   . Shortness of breath dyspnea   . Sleep apnea    NO C-PAP, Patient stated in process of  "getting one"   . Tobacco dependence     Patient Active Problem List   Diagnosis Date Noted  . Tobacco use 10/07/2017  . Pneumonia 09/18/2017  . Apnea   . End-stage renal disease on hemodialysis ([place])   . Hypervolemia   . Altered mental status   . Acute on chronic systolic CHF (congestive heart failure) ([place]) 01/18/2017  . Pulmonary embolism ([place])  08/03/2016  . NSTEMI (non-ST elevated myocardial infarction) ([place]) 07/26/2016  . PVD (peripheral vascular disease) ([place]) 03/05/2016  . Preop examination 05/02/2015  . Chronic systolic heart failure ([place])   . Sepsis ([place]) 01/13/2015  . Hyperkalemia 01/13/2015  . ESRD (end stage renal disease) on dialysis ([place]) 05/04/2014  . CAD (coronary artery disease) 05/04/2014  . Normocytic anemia 05/04/2014  . Hyperlipidemia 04/26/2010  . HYPERTENSION, BENIGN 04/26/2010  . CAD, NATIVE VESSEL 04/26/2010    Past Surgical History:  Procedure Laterality Date  . A/V FISTULAGRAM Left 08/14/2017   Procedure: A/V FISTULAGRAM;  Surgeon: Algernon Huxley, MD;  Location: [facility];  Service: Cardiovascular;  Laterality: Left;  . AMPUTATION Left 05/06/2014   Procedure: AMPUTATION BELOW KNEE;  Surgeon: Elam Dutch, MD;  Location: [facility];  Service: Vascular;  Laterality: Left;  . AMPUTATION Right 01/12/2015   Procedure: Foot transmetatarsal amputation;  Surgeon: Algernon Huxley, MD;  Location: [facility];  Service: Vascular;  Laterality: Right;  . APPLICATION OF WOUND VAC Right 03/01/2015   Procedure: Application of Bio-connekt graft and wound vac application to right foot ;  Surgeon: Algernon Huxley, MD;  Location: [facility];  Service: Vascular;  Laterality: Right;  . AV FISTULA PLACEMENT Left   . AV FISTULA PLACEMENT Left 11/28/2016   Procedure: ARTERIOVENOUS (AV) FISTULA CREATION;  Surgeon: Algernon Huxley, MD;  Location: [facility];  Service: Vascular;  Laterality: Left;  . CARDIAC CATHETERIZATION     stent placement   . CORONARY ANGIOPLASTY    . DIALYSIS/PERMA CATHETER INSERTION N/A 07/31/2016   Procedure: Dialysis/Perma Catheter Insertion;  Surgeon: Algernon Huxley, MD;  Location: [facility];  Service: Cardiovascular;  Laterality: N/A;  . DIALYSIS/PERMA CATHETER INSERTION N/A 07/25/2017   Procedure: DIALYSIS/PERMA CATHETER INSERTION and fistulagram;  Surgeon: Algernon Huxley, MD;  Location: [facility];   Service: Cardiovascular;  Laterality: N/A;  . DIALYSIS/PERMA CATHETER REMOVAL N/A 07/22/2017   Procedure: DIALYSIS/PERMA CATHETER REMOVAL;  Surgeon: Katha Cabal, MD;  Location: [facility];  Service: Cardiovascular;  Laterality: N/A;  . IR FLUORO GUIDE CV LINE LEFT  04/10/2017  . LIGATION OF ARTERIOVENOUS  FISTULA Right 01/31/2016   Procedure: LIGATION OF ARTERIOVENOUS  FISTULA;  Surgeon: Algernon Huxley, MD;  Location: [facility];  Service: Vascular;  Laterality: Right;  . PERIPHERAL VASCULAR CATHETERIZATION Right 12/15/2014   Procedure: Lower Extremity Angiography;  Surgeon: Algernon Huxley, MD;  Location: [facility];  Service: Cardiovascular;  Laterality: Right;  . PERIPHERAL VASCULAR CATHETERIZATION  12/15/2014   Procedure: Lower Extremity Intervention;  Surgeon: Algernon Huxley, MD;  Location: [facility];  Service: Cardiovascular;;  . PERIPHERAL VASCULAR CATHETERIZATION Right 08/14/2015   Procedure: A/V Shuntogram/Fistulagram;  Surgeon: Algernon Huxley, MD;  Location: [facility];  Service: Cardiovascular;  Laterality: Right;  . PERIPHERAL VASCULAR CATHETERIZATION N/A 08/14/2015   Procedure: A/V Shunt Intervention;  Surgeon: Algernon Huxley, MD;  Location: [facility];  Service: Cardiovascular;  Laterality: N/A;  . PERIPHERAL VASCULAR CATHETERIZATION N/A 01/11/2016   Procedure: Dialysis/Perma Catheter Insertion;  Surgeon: Algernon Huxley, MD;  Location: [facility];  Service: Cardiovascular;  Laterality: N/A;  . REVISON OF ARTERIOVENOUS FISTULA Right 02/17/2016   Procedure: removal of AV fistula;  Surgeon: Serafina Mitchell, MD;  Location: [facility];  Service: Vascular;  Laterality: Right;  . REVISON OF ARTERIOVENOUS FISTULA Right 01/31/2016   Procedure: REVISON OF ARTERIOVENOUS FISTULA ( BRACHIOCEPHALIC ) W/ ARTEGRAFT;  Surgeon: Algernon Huxley, MD;  Location: [facility];  Service: Vascular;  Laterality: Right;  . TRANSMETATARSAL AMPUTATION Right 05/04/2015   Procedure:  TRANSMETATARSAL AMPUTATION REVISION, great toe amputation;  Surgeon: Algernon Huxley, MD;  Location: [facility];  Service: Vascular;  Laterality: Right;    Prior to Admission medications   Medication Sig Start Date End Date Taking? Authorizing Provider  acetaminophen (TYLENOL) 500 MG tablet Take 500 mg by mouth every 6 (six) hours as needed for mild pain.     [provider]  albuterol (PROVENTIL HFA;VENTOLIN HFA) 108 (90 Base) MCG/ACT inhaler Inhale 1-2 puffs every 6 (six) hours as needed into the lungs for wheezing or shortness of breath.    [provider]  apixaban (ELIQUIS) 5 MG TABS tablet Take 1 tablet (5 mg total) by mouth 2 (two) times daily. 01/20/17   Gladstone Lighter, MD  aspirin EC 325 MG tablet Take 325 mg by mouth daily.     [provider]  atorvastatin (LIPITOR) 10 MG tablet Take 10 mg by mouth at bedtime.     [provider]  calcium acetate (PHOSLO) 667 MG capsule Take 667 mg by mouth daily.     [provider]  carvedilol (COREG) 6.25 MG tablet Take 1 tablet (6.25 mg total) by mouth 2 (two)  times daily. 10/07/17   Alisa Graff, FNP  cetirizine (ZYRTEC) 10 MG tablet Take 10 mg by mouth daily.    [provider]  docusate sodium (COLACE) 100 MG capsule Take 100 mg by mouth daily as needed for mild constipation or moderate constipation.     [provider]  fluticasone (FLONASE) 50 MCG/ACT nasal spray Place 1 spray daily into the nose.     [provider]  guaifenesin (ROBAFEN) 100 MG/5ML syrup Take 200 mg by mouth 3 (three) times daily as needed for cough.    [provider]  hydrocortisone 2.5 % cream Apply 1 application topically as needed.    [provider]  Ipratropium-Albuterol (COMBIVENT RESPIMAT) 20-100 MCG/ACT AERS respimat Inhale 1 puff into the lungs every 6 (six) hours as needed for wheezing or shortness of breath.    [provider]  isosorbide mononitrate (IMDUR) 30 MG 24  hr tablet Take 30 mg by mouth daily.  08/22/16   [provider]  midodrine (PROAMATINE) 10 MG tablet Take 5 mg by mouth 2 (two) times daily.    [provider]  Multiple Vitamin (MULTIVITAMIN WITH MINERALS) TABS tablet Take 1 tablet by mouth 2 (two) times daily.    [provider]  nitroGLYCERIN (NITROSTAT) 0.4 MG SL tablet Place 0.4 mg under the tongue every 5 (five) minutes as needed for chest pain.    [provider]  pantoprazole (PROTONIX) 20 MG tablet Take 20 mg by mouth daily.    [provider]  patiromer (VELTASSA) 8.4 g packet Take 1 packet (8.4 g total) by mouth daily. 07/26/17   Bettey Costa, MD  polyethylene glycol (MIRALAX / GLYCOLAX) packet Take 17 g by mouth daily as needed for mild constipation.     [provider]  pregabalin (LYRICA) 75 MG capsule Take 1 capsule (75 mg total) by mouth daily. 01/22/17   Gladstone Lighter, MD  sevelamer carbonate (RENVELA) 800 MG tablet Take 800 mg by mouth 3 (three) times daily with meals.     [provider]  tiotropium (SPIRIVA) 18 MCG inhalation capsule Place 18 mcg into inhaler and inhale daily.    [provider]    Allergies Dust mite extract  Family History  Problem Relation Age of Onset  . Leukemia Mother   . Heart attack Father   . Heart failure Other   . Hypertension Other   . Leukemia Other   . Diabetes Other   . Prostate cancer Neg Hx   . Kidney cancer Neg Hx   . Bladder Cancer Neg Hx     Social History Social History   Tobacco Use  . Smoking status: Light Tobacco Smoker    Packs/day: 0.25    Years: 30.00    Pack years: 7.50    Types: Cigarettes  . Smokeless tobacco: Never Used  . Tobacco comment: 5  Substance Use Topics  . Alcohol use: No    Alcohol/week: 0.0 oz  . Drug use: No    Comment: has used crack cocaine in past     Review of Systems   Constitutional: No fever/chills Eyes: Negative for visual changes. Positive for "irritation"  of the right eye.. Musculoskeletal: Negative for pain. Skin: Negative for rash. Neurological: Negative for headaches, focal weakness or numbness. Allergic: Negative for seasonal allergies. ____________________________________________  PHYSICAL EXAM:  VITAL SIGNS: ED Triage Vitals  Enc Vitals Group     BP 10/25/17 1030 (!) 150/84     Pulse Rate  10/25/17 1030 (!) 120     Resp 10/25/17 1030 20     Temp 10/25/17 1030 98.6 F (37 C)     Temp Source 10/25/17 1030 Oral     SpO2 10/25/17 1030 94 %     Weight 10/25/17 1033 210 lb (95.3 kg)     Height 10/25/17 1033 6\' 2"  (1.88 m)     Head Circumference --      Peak Flow --      Pain Score 10/25/17 1032 3     Pain Loc --      Pain Edu? --      Excl. in [place]? --     Constitutional: Alert and oriented. Well appearing and in no acute distress. Eyes: Visual acuity--see nursing documentation; No globe trauma; both eyelids normal to inspection.  Eyelid was inverted. Conjunctiva with circumfrential elevated bullous conjunctival hemorrhage; Cornea clear, intact. No hyphema. Head: Atraumatic. Nose: No congestion/rhinnorhea. Mouth/Throat: Mucous membranes are moist.  Oropharynx non-erythematous. Respiratory: Respirations even and unlabored. Musculoskeletal:Normal ROM x 4 extremities. Neurologic:  Normal speech and language. No gross focal neurologic deficits are appreciated. Speech is normal. No gait instability. Skin:  Skin is warm, dry and intact. No rash noted. Psychiatric: Mood and affect are normal. Speech and behavior are normal.  ____________________________________________   LABS (all labs ordered are listed, but only abnormal results are displayed)  Labs Reviewed  PROTIME-INR - Abnormal; Notable for the following components:      Result Value   Prothrombin Time 16.6 (*)    All other components within normal limits   ____________________________________________  EKG  Not  indicated. ____________________________________________  RADIOLOGY  Not indicated. ____________________________________________   PROCEDURES  Procedure(s) performed: None ____________________________________________   INITIAL IMPRESSION / ASSESSMENT AND PLAN / ED COURSE  62 year old male presenting to the emergency department for treatment and evaluation of right eye irritation.  Because of the extent of the subconjunctival hemorrhage, ophthalmology was consulted. Patient is to follow up with [facility] on Monday or Tuesday. I will have him hold his Eliquis for the next 2 days. He is to return to the ER if his vision becomes impaired or if he has any increase in pain.   Pertinent labs & imaging results that were available during my care of the patient were reviewed by me and considered in my medical decision making (see chart for details). ____________________________________________   FINAL CLINICAL IMPRESSION(S) / ED DIAGNOSES  Final diagnoses:  Subconjunctival hemorrhage of right eye    Note:  This document was prepared using Dragon voice recognition software and may include unintentional dictation errors.    Victorino Dike, FNP 10/25/17 1328    Arta Silence, MD 10/25/17 1545

## 2017-10-25 NOTE — ED Notes (Signed)
[facility] called for transport and per [place] call EMS for transport back to [facility].

## 2017-10-25 NOTE — ED Triage Notes (Signed)
States after getting shower felt eye irritation. Rubbed R eye. Noted increased discomfort. Bleeding noted R sclera.

## 2017-10-27 NOTE — Progress Notes (Signed)
[facility] Consultation      Assessment and Plan:  Obstructive sleep apnea. -History of sleep apnea diagnosed remotely, recently restarted on BiPAP, 20/12. - Sent for new sleep study; started on auto BiPAP with EPAP of 12, maximum EPAP of 20, pressure support of 5. -Has not been using PAP regularly because of concerns about cleaning his tubing.  He was instructed on proper cleaning techniques today, and he will try to use the machine 64 more. -We will need to have the patient follow-up in about 4 weeks to reconfirm proper compliance with his machine in order to get insurance to cover his machine.   Essential hypertension, diabetes mellitus, systolic congestive heart failure. -These conditions can contribute to obstructive sleep apnea, and are contributing to by obstructive sleep apnea, therefore appropriate treatment the patient's underlying sleep apnea is an important part of their management.  Nicotine abuse. -Discussed the importance of smoke cessation on his health.  Spent greater than 3 minutes in discussion.   Date: 64/07/2017  MRN# [id number] ANIKET PAYE 64/06/1955  Referring Physician: Hospitalist physician.   FEDERICK LEVENE is a 62 y.o. old male seen in consultation for chief complaint of:    No chief complaint on file.   Synopsis: The patient is a 62 year old male with a history of copd, continued nicotine above, persistent right pleural effusion, ascites, history of CAD with chronic systolic congestive heart failure, CHF (EF 20 %), hypertension, hyperlipidemia, history of PE, end-stage renal disease on hemodialysis, peripheral vascular disease status post left below the knee amputation.  He also has a history of right thoracentesis 09/08/13 at [facility].  Previous history of sleep apnea, was intolerant and stopped using CPAP.  Since last visit: Patient has had 3 admissions to the [facility] in the past 6 months for multiple problems including CHF exacerbation,  volume overload from missed hemodialysis. Last visit he was noted that he had symptoms of obstructive sleep apnea, he was sent for repeat sleep study.  He is concerned about using his device regularly, because he is an amputee living at the [facility] assisted [facility], and he has nobody to wash his tubing for him.  He otherwise feels that he was tolerating it okay.  He continues to smoke 3 cigs per day, one at each meal. He has quit in the past but then restarted. He is thinking about quitting but not yet ready.   **I personally reviewed download data 64 days as of 64/20/2019.  Usage greater than 4 hours is 1 day.  Average usage on days used is 2 hours 32 minutes.  Current settings 20/12.  Residual AHI is 34, predominantly obstructive.  This shows poor compliance with poor control of sleep apnea. **Split-night sleep study 64/04/2018; AHI remained elevated above 40 at all CPAP and BiPAP levels until the pressure 22/16 when AHI dropped to 2.1.  12 minutes of supine REM was achieved at a pressure of 19/12.  Patient was prescribed auto BiPAP with EPAP of 12, maximum EPAP of 20, pressure support of 5.  Social Hx:   Social History   Tobacco Use  . Smoking status: Light Tobacco Smoker    Packs/day: 0.25    Years: 30.00    Pack years: 7.50    Types: Cigarettes  . Smokeless tobacco: Never Used  . Tobacco comment: 5  Substance Use Topics  . Alcohol use: No    Alcohol/week: 0.0 oz  . Drug use: No    Comment: has used crack cocaine in past  Medication:    Current Outpatient Medications:  .  acetaminophen (TYLENOL) 500 MG tablet, Take 500 mg by mouth every 6 (six) hours as needed for mild pain. , Disp: , Rfl:  .  albuterol (PROVENTIL HFA;VENTOLIN HFA) 108 (90 Base) MCG/ACT inhaler, Inhale 1-2 puffs every 6 (six) hours as needed into the lungs for wheezing or shortness of breath., Disp: , Rfl:  .  apixaban (ELIQUIS) 5 MG TABS tablet, Take 1 tablet (5 mg total) by mouth 2 (two) times daily., Disp: 60 tablet,  Rfl: 2 .  aspirin EC 325 MG tablet, Take 325 mg by mouth daily. , Disp: , Rfl:  .  atorvastatin (LIPITOR) 10 MG tablet, Take 10 mg by mouth at bedtime. , Disp: , Rfl:  .  calcium acetate (PHOSLO) 667 MG capsule, Take 667 mg by mouth daily. , Disp: , Rfl:  .  carvedilol (COREG) 6.25 MG tablet, Take 1 tablet (6.25 mg total) by mouth 2 (two) times daily., Disp: 180 tablet, Rfl: 3 .  cetirizine (ZYRTEC) 10 MG tablet, Take 10 mg by mouth daily., Disp: , Rfl:  .  docusate sodium (COLACE) 100 MG capsule, Take 100 mg by mouth daily as needed for mild constipation or moderate constipation. , Disp: , Rfl:  .  fluticasone (FLONASE) 50 MCG/ACT nasal spray, Place 1 spray daily into the nose. , Disp: , Rfl:  .  guaifenesin (ROBAFEN) 100 MG/5ML syrup, Take 200 mg by mouth 3 (three) times daily as needed for cough., Disp: , Rfl:  .  hydrocortisone 2.5 % cream, Apply 1 application topically as needed., Disp: , Rfl:  .  Ipratropium-Albuterol (COMBIVENT RESPIMAT) 20-100 MCG/ACT AERS respimat, Inhale 1 puff into the lungs every 6 (six) hours as needed for wheezing or shortness of breath., Disp: , Rfl:  .  isosorbide mononitrate (IMDUR) 30 MG 24 hr tablet, Take 30 mg by mouth daily. , Disp: , Rfl:  .  midodrine (PROAMATINE) 10 MG tablet, Take 5 mg by mouth 2 (two) times daily., Disp: , Rfl:  .  Multiple Vitamin (MULTIVITAMIN WITH MINERALS) TABS tablet, Take 1 tablet by mouth 2 (two) times daily., Disp: , Rfl:  .  nitroGLYCERIN (NITROSTAT) 0.4 MG SL tablet, Place 0.4 mg under the tongue every 5 (five) minutes as needed for chest pain., Disp: , Rfl:  .  pantoprazole (PROTONIX) 20 MG tablet, Take 20 mg by mouth daily., Disp: , Rfl:  .  patiromer (VELTASSA) 8.4 g packet, Take 1 packet (8.4 g total) by mouth daily., Disp: 30 packet, Rfl: 0 .  polyethylene glycol (MIRALAX / GLYCOLAX) packet, Take 17 g by mouth daily as needed for mild constipation. , Disp: , Rfl:  .  pregabalin (LYRICA) 75 MG capsule, Take 1 capsule (75 mg  total) by mouth daily., Disp: 20 capsule, Rfl: 0 .  sevelamer carbonate (RENVELA) 800 MG tablet, Take 800 mg by mouth 3 (three) times daily with meals. , Disp: , Rfl:  .  tiotropium (SPIRIVA) 18 MCG inhalation capsule, Place 18 mcg into inhaler and inhale daily., Disp: , Rfl:    Allergies:  Dust mite extract  Review of Systems: Gen:  Denies  fever, sweats, chills HEENT: Denies blurred vision, double vision. bleeds, sore throat Cvc:  No dizziness, chest pain. Resp:   Denies cough or sputum production, shortness of breath Gi: Denies swallowing difficulty, stomach pain. Gu:  Denies bladder incontinence, burning urine Ext:   No Joint pain, stiffness. Skin: No skin rash,  hives  Endoc:  No  polyuria, polydipsia. Psych: No depression, insomnia. Other:  All other systems were reviewed with the patient and were negative other that what is mentioned in the HPI.   Physical Examination:   VS: BP 106/60 (BP Location: Right Arm, Cuff Size: Large)   Pulse 73   Resp 16   Ht 6\' 2"  (1.88 m)   SpO2 100%   BMI 26.96 kg/m   General Appearance: No distress  Neuro:without focal findings,  speech normal,  HEENT: PERRLA, EOM intact.  Mallampati 4 Pulmonary: normal breath sounds, No wheezing.  CardiovascularNormal S1,S2.  No m/r/g.   Abdomen: Benign, Soft, non-tender. Renal:  No costovertebral tenderness  GU:  No performed at this time. Endoc: No evident thyromegaly, no signs of acromegaly. Skin:   warm, no rashes, no ecchymosis  Extremities: normal, no cyanosis, left foot amputation  Other findings:    LABORATORY PANEL:   CBC No results for input(s): WBC, HGB, HCT, PLT in the last 168 hours. ------------------------------------------------------------------------------------------------------------------  Chemistries  No results for input(s): NA, K, CL, CO2, GLUCOSE, BUN, CREATININE, CALCIUM, MG, AST, ALT, ALKPHOS, BILITOT in the last 168 hours.  Invalid input(s):  GFRCGP ------------------------------------------------------------------------------------------------------------------  Cardiac Enzymes No results for input(s): TROPONINI in the last 168 hours. ------------------------------------------------------------  RADIOLOGY:  No results found.     Thank  you for the consultation and for allowing [facility] to assist in the care of your patient. Our recommendations are noted above.  Please contact us if we can be of further service.   Marda Stalker, MD.  Board Certified in Internal Medicine, Pulmonary Medicine, [place], and Sleep Medicine.  [facility] Office Number: [phone]  Patricia Pesa, M.D.  Merton Border, M.D  10/27/2017

## 2017-10-28 ENCOUNTER — Encounter: Payer: Self-pay | Admitting: Internal Medicine

## 2017-10-28 ENCOUNTER — Ambulatory Visit (INDEPENDENT_AMBULATORY_CARE_PROVIDER_SITE_OTHER): Payer: Medicare Other | Admitting: Internal Medicine

## 2017-10-28 ENCOUNTER — Encounter (INDEPENDENT_AMBULATORY_CARE_PROVIDER_SITE_OTHER): Payer: Medicare Other

## 2017-10-28 ENCOUNTER — Encounter

## 2017-10-28 ENCOUNTER — Ambulatory Visit (INDEPENDENT_AMBULATORY_CARE_PROVIDER_SITE_OTHER): Payer: Medicare Other | Admitting: Vascular Surgery

## 2017-10-28 VITALS — BP 106/60 | HR 73 | Resp 16 | Ht 74.0 in

## 2017-10-28 DIAGNOSIS — G4733 Obstructive sleep apnea (adult) (pediatric): Secondary | ICD-10-CM | POA: Diagnosis not present

## 2017-10-28 NOTE — Patient Instructions (Signed)
Make sure to run cold water through your house for 30 seconds to a minute once per week.

## 2017-11-04 ENCOUNTER — Ambulatory Visit: Payer: Medicare Other | Admitting: Family

## 2017-11-18 ENCOUNTER — Ambulatory Visit: Payer: Medicare Other | Attending: Family | Admitting: Family

## 2017-11-18 ENCOUNTER — Encounter: Payer: Self-pay | Admitting: Family

## 2017-11-18 VITALS — BP 112/78 | HR 64 | Resp 18 | Ht 74.0 in | Wt 210.0 lb

## 2017-11-18 DIAGNOSIS — N186 End stage renal disease: Secondary | ICD-10-CM | POA: Diagnosis not present

## 2017-11-18 DIAGNOSIS — Z720 Tobacco use: Secondary | ICD-10-CM

## 2017-11-18 DIAGNOSIS — I252 Old myocardial infarction: Secondary | ICD-10-CM | POA: Insufficient documentation

## 2017-11-18 DIAGNOSIS — D649 Anemia, unspecified: Secondary | ICD-10-CM | POA: Diagnosis not present

## 2017-11-18 DIAGNOSIS — G4730 Sleep apnea, unspecified: Secondary | ICD-10-CM | POA: Diagnosis not present

## 2017-11-18 DIAGNOSIS — I2510 Atherosclerotic heart disease of native coronary artery without angina pectoris: Secondary | ICD-10-CM | POA: Insufficient documentation

## 2017-11-18 DIAGNOSIS — J45909 Unspecified asthma, uncomplicated: Secondary | ICD-10-CM | POA: Diagnosis not present

## 2017-11-18 DIAGNOSIS — I429 Cardiomyopathy, unspecified: Secondary | ICD-10-CM | POA: Diagnosis not present

## 2017-11-18 DIAGNOSIS — Z89512 Acquired absence of left leg below knee: Secondary | ICD-10-CM | POA: Diagnosis not present

## 2017-11-18 DIAGNOSIS — I132 Hypertensive heart and chronic kidney disease with heart failure and with stage 5 chronic kidney disease, or end stage renal disease: Secondary | ICD-10-CM | POA: Insufficient documentation

## 2017-11-18 DIAGNOSIS — Z7901 Long term (current) use of anticoagulants: Secondary | ICD-10-CM | POA: Insufficient documentation

## 2017-11-18 DIAGNOSIS — Z7951 Long term (current) use of inhaled steroids: Secondary | ICD-10-CM | POA: Diagnosis not present

## 2017-11-18 DIAGNOSIS — Z992 Dependence on renal dialysis: Secondary | ICD-10-CM | POA: Diagnosis not present

## 2017-11-18 DIAGNOSIS — I10 Essential (primary) hypertension: Secondary | ICD-10-CM

## 2017-11-18 DIAGNOSIS — K219 Gastro-esophageal reflux disease without esophagitis: Secondary | ICD-10-CM | POA: Diagnosis not present

## 2017-11-18 DIAGNOSIS — I739 Peripheral vascular disease, unspecified: Secondary | ICD-10-CM | POA: Diagnosis not present

## 2017-11-18 DIAGNOSIS — I5022 Chronic systolic (congestive) heart failure: Secondary | ICD-10-CM | POA: Insufficient documentation

## 2017-11-18 DIAGNOSIS — E785 Hyperlipidemia, unspecified: Secondary | ICD-10-CM | POA: Insufficient documentation

## 2017-11-18 DIAGNOSIS — Z8249 Family history of ischemic heart disease and other diseases of the circulatory system: Secondary | ICD-10-CM | POA: Insufficient documentation

## 2017-11-18 DIAGNOSIS — Z86711 Personal history of pulmonary embolism: Secondary | ICD-10-CM | POA: Insufficient documentation

## 2017-11-18 DIAGNOSIS — J449 Chronic obstructive pulmonary disease, unspecified: Secondary | ICD-10-CM | POA: Insufficient documentation

## 2017-11-18 DIAGNOSIS — R000 Tachycardia, unspecified: Secondary | ICD-10-CM | POA: Diagnosis not present

## 2017-11-18 DIAGNOSIS — F17210 Nicotine dependence, cigarettes, uncomplicated: Secondary | ICD-10-CM | POA: Insufficient documentation

## 2017-11-18 DIAGNOSIS — Z87442 Personal history of urinary calculi: Secondary | ICD-10-CM | POA: Insufficient documentation

## 2017-11-18 DIAGNOSIS — Z79899 Other long term (current) drug therapy: Secondary | ICD-10-CM | POA: Diagnosis not present

## 2017-11-18 DIAGNOSIS — Z955 Presence of coronary angioplasty implant and graft: Secondary | ICD-10-CM | POA: Diagnosis not present

## 2017-11-18 DIAGNOSIS — Z7982 Long term (current) use of aspirin: Secondary | ICD-10-CM | POA: Insufficient documentation

## 2017-11-18 DIAGNOSIS — I509 Heart failure, unspecified: Secondary | ICD-10-CM | POA: Diagnosis present

## 2017-11-18 NOTE — Progress Notes (Signed)
Patient ID: [name], male    DOB: [birth year], 62 y.o.   MRN: [id number]  HPI  Mr Russomanno is a 62 y/o male with a history of asthma, CAD, hyperlipidemia, HTN, CKD (ESRD on dialysis), anemia, COPD, GERD, PVD, MI, obstructive sleep apnea, left BKA, current tobacco use and chronic heart failure.   Echo report from 07/23/17 reviewed and showed an EF of 20-25%.  Was in the ED 10/25/17 due to subconjunctival hemorrhage of his right eye where he was evaluated and released. Admitted 09/18/17 due to pneumonia. Initially given IV antibiotics and then transitioned to oral antibiotics at discharge. Discharged the following day. Admitted 07/21/17 due to acute on chronic respiratory failure. Given oral steroids due to acute bronchitis. Received dialysis while inpatient. ID and vascular consults were obtained. MSSA bacteremia was noted due to IJ dialysis catheter which was subsequently removed. Elevated troponin thought to be due to demand ischemia. Discharged after 4 days.   He presents today for a follow-up visit with a chief complaint of moderate fatigue upon minimal exertion. He describes this as chronic in nature having been present for several years. He has associated shortness of breath, dizziness, headaches, leg weakness and difficulty sleeping along with this. He denies any abdominal distention, palpitations, pedal edema and chest pain. Wearing his CPAP about 4 hours each night.   Past Medical History:  Diagnosis Date  . Amputation, traumatic, toes ([place])    Right Foot  . Amputee, below knee, left ([place])   . Anemia   . Asthma   . Cardiomyopathy ([place])   . CHF (congestive heart failure) ([place])   . Chronic systolic heart failure ([place])   . Complication of anesthesia    hypotension  . COPD (chronic obstructive pulmonary disease) ([place])   . Coronary artery disease   . Dialysis patient ([place])    Mon, Wed, Fri  . End stage renal disease ([place])   . GERD (gastroesophageal reflux disease)   . Headache   .  History of kidney stones   . History of pulmonary embolism   . HLD (hyperlipidemia)   . HTN (hypertension)   . Hyperparathyroidism   . Myocardial infarction ([place])   . Peripheral vascular disease ([place])   . Shortness of breath dyspnea   . Sleep apnea    NO C-PAP, Patient stated in process of  "getting one"   . Tobacco dependence    Past Surgical History:  Procedure Laterality Date  . A/V FISTULAGRAM Left 08/14/2017   Procedure: A/V FISTULAGRAM;  Surgeon: Algernon Huxley, MD;  Location: [facility];  Service: Cardiovascular;  Laterality: Left;  . AMPUTATION Left 05/06/2014   Procedure: AMPUTATION BELOW KNEE;  Surgeon: Elam Dutch, MD;  Location: [facility];  Service: Vascular;  Laterality: Left;  . AMPUTATION Right 01/12/2015   Procedure: Foot transmetatarsal amputation;  Surgeon: Algernon Huxley, MD;  Location: [facility];  Service: Vascular;  Laterality: Right;  . APPLICATION OF WOUND VAC Right 03/01/2015   Procedure: Application of Bio-connekt graft and wound vac application to right foot ;  Surgeon: Algernon Huxley, MD;  Location: [facility];  Service: Vascular;  Laterality: Right;  . AV FISTULA PLACEMENT Left   . AV FISTULA PLACEMENT Left 11/28/2016   Procedure: ARTERIOVENOUS (AV) FISTULA CREATION;  Surgeon: Algernon Huxley, MD;  Location: [facility];  Service: Vascular;  Laterality: Left;  . CARDIAC CATHETERIZATION     stent placement   . CORONARY ANGIOPLASTY    . DIALYSIS/PERMA  CATHETER INSERTION N/A 07/31/2016   Procedure: Dialysis/Perma Catheter Insertion;  Surgeon: Algernon Huxley, MD;  Location: [place] [facility];  Service: Cardiovascular;  Laterality: N/A;  . DIALYSIS/PERMA CATHETER INSERTION N/A 07/25/2017   Procedure: DIALYSIS/PERMA CATHETER INSERTION and fistulagram;  Surgeon: Algernon Huxley, MD;  Location: [facility];  Service: Cardiovascular;  Laterality: N/A;  . DIALYSIS/PERMA CATHETER REMOVAL N/A 07/22/2017   Procedure: DIALYSIS/PERMA CATHETER REMOVAL;  Surgeon: Katha Cabal,  MD;  Location: [facility];  Service: Cardiovascular;  Laterality: N/A;  . IR FLUORO GUIDE CV LINE LEFT  04/10/2017  . LIGATION OF ARTERIOVENOUS  FISTULA Right 01/31/2016   Procedure: LIGATION OF ARTERIOVENOUS  FISTULA;  Surgeon: Algernon Huxley, MD;  Location: [facility];  Service: Vascular;  Laterality: Right;  . PERIPHERAL VASCULAR CATHETERIZATION Right 12/15/2014   Procedure: Lower Extremity Angiography;  Surgeon: Algernon Huxley, MD;  Location: [place];  Service: Cardiovascular;  Laterality: Right;  . PERIPHERAL VASCULAR CATHETERIZATION  12/15/2014   Procedure: Lower Extremity Intervention;  Surgeon: Algernon Huxley, MD;  Location: [facility];  Service: Cardiovascular;;  . PERIPHERAL VASCULAR CATHETERIZATION Right 08/14/2015   Procedure: A/V Shuntogram/Fistulagram;  Surgeon: Algernon Huxley, MD;  Location: [facility];  Service: Cardiovascular;  Laterality: Right;  . PERIPHERAL VASCULAR CATHETERIZATION N/A 08/14/2015   Procedure: A/V Shunt Intervention;  Surgeon: Algernon Huxley, MD;  Location: [place] [facility];  Service: Cardiovascular;  Laterality: N/A;  . PERIPHERAL VASCULAR CATHETERIZATION N/A 01/11/2016   Procedure: Dialysis/Perma Catheter Insertion;  Surgeon: Algernon Huxley, MD;  Location: [name];  Service: Cardiovascular;  Laterality: N/A;  . REVISON OF ARTERIOVENOUS FISTULA Right 02/17/2016   Procedure: removal of AV fistula;  Surgeon: Serafina Mitchell, MD;  Location: [facility];  Service: Vascular;  Laterality: Right;  . REVISON OF ARTERIOVENOUS FISTULA Right 01/31/2016   Procedure: REVISON OF ARTERIOVENOUS FISTULA ( BRACHIOCEPHALIC ) W/ ARTEGRAFT;  Surgeon: Algernon Huxley, MD;  Location: [facility];  Service: Vascular;  Laterality: Right;  . TRANSMETATARSAL AMPUTATION Right 05/04/2015   Procedure: TRANSMETATARSAL AMPUTATION REVISION, great toe amputation;  Surgeon: Algernon Huxley, MD;  Location: [facility];  Service: Vascular;  Laterality: Right;   Family History   Problem Relation Age of Onset  . Leukemia Mother   . Heart attack Father   . Heart failure Other   . Hypertension Other   . Leukemia Other   . Diabetes Other   . Prostate cancer Neg Hx   . Kidney cancer Neg Hx   . Bladder Cancer Neg Hx    Social History   Tobacco Use  . Smoking status: Light Tobacco Smoker    Packs/day: 0.25    Years: 30.00    Pack years: 7.50    Types: Cigarettes  . Smokeless tobacco: Never Used  . Tobacco comment: 5  Substance Use Topics  . Alcohol use: No    Alcohol/week: 0.0 oz   Allergies  Allergen Reactions  . Dust Mite Extract Other (See Comments)    Reaction: unknown   Prior to Admission medications   Medication Sig Start Date End Date Taking? Authorizing Provider  acetaminophen (TYLENOL) 500 MG tablet Take 500 mg by mouth every 6 (six) hours as needed for mild pain.    Yes [provider]  albuterol (PROVENTIL HFA;VENTOLIN HFA) 108 (90 Base) MCG/ACT inhaler Inhale 1-2 puffs every 6 (six) hours as needed into the lungs for wheezing or shortness  of breath.   Yes [provider]  apixaban (ELIQUIS) 5 MG TABS tablet Take 1 tablet (5 mg total) by mouth 2 (two) times daily. 01/20/17  Yes Gladstone Lighter, MD  aspirin EC 325 MG tablet Take 325 mg by mouth daily.    Yes [provider]  atorvastatin (LIPITOR) 10 MG tablet Take 10 mg by mouth at bedtime.    Yes [provider]  budesonide-formoterol (SYMBICORT) 160-4.5 MCG/ACT inhaler Inhale 2 puffs into the lungs 2 (two) times daily.   Yes [provider]  calcium acetate (PHOSLO) 667 MG capsule Take 667 mg by mouth daily.    Yes [provider]  carvedilol (COREG) 6.25 MG tablet Take 1 tablet (6.25 mg total) by mouth 2 (two) times daily. 10/07/17  Yes Bear Osten, Otila Kluver A, FNP  cetirizine (ZYRTEC) 10 MG tablet Take 10 mg by mouth daily.   Yes [provider]  docusate sodium (COLACE) 100 MG capsule Take 100 mg by mouth daily as needed for mild  constipation or moderate constipation.    Yes [provider]  erythromycin ophthalmic ointment Place 1 application into the right eye at bedtime.   Yes [provider]  guaifenesin (ROBAFEN) 100 MG/5ML syrup Take 200 mg by mouth 3 (three) times daily as needed for cough.   Yes [provider]  hydrocortisone 2.5 % cream Apply 1 application topically as needed.   Yes [provider]  Ipratropium-Albuterol (COMBIVENT RESPIMAT) 20-100 MCG/ACT AERS respimat Inhale 1 puff into the lungs every 6 (six) hours as needed for wheezing or shortness of breath.   Yes [provider]  isosorbide mononitrate (IMDUR) 30 MG 24 hr tablet Take 30 mg by mouth daily.  08/22/16  Yes [provider]  midodrine (PROAMATINE) 10 MG tablet Take 5 mg by mouth 2 (two) times daily.   Yes [provider]  Multiple Vitamins-Minerals (THERA-M PO) Take 1 tablet by mouth 2 (two) times daily.   Yes [provider]  nitroGLYCERIN (NITROSTAT) 0.4 MG SL tablet Place 0.4 mg under the tongue every 5 (five) minutes as needed for chest pain.   Yes [provider]  pantoprazole (PROTONIX) 20 MG tablet Take 20 mg by mouth daily.   Yes [provider]  patiromer (VELTASSA) 8.4 g packet Take 1 packet (8.4 g total) by mouth daily. 07/26/17  Yes Mody, Ulice Bold, MD  polyethylene glycol (MIRALAX / GLYCOLAX) packet Take 17 g by mouth daily as needed for mild constipation.    Yes [provider]  pregabalin (LYRICA) 75 MG capsule Take 1 capsule (75 mg total) by mouth daily. 01/22/17  Yes Gladstone Lighter, MD  sevelamer carbonate (RENVELA) 800 MG tablet Take 800 mg by mouth 3 (three) times daily with meals.    Yes [provider]  sodium polystyrene (KAYEXALATE) powder Take 15 g by mouth once.   Yes [provider]  tiotropium (SPIRIVA) 18 MCG inhalation capsule Place 18 mcg into inhaler and inhale daily.   Yes [provider]   fluticasone (FLONASE) 50 MCG/ACT nasal spray Place 1 spray daily into the nose.     [provider]  Multiple Vitamin (MULTIVITAMIN WITH MINERALS) TABS tablet Take 1 tablet by mouth 2 (two) times daily.    [provider]    Review of Systems  Constitutional: Positive for fatigue (tire easily). Negative for appetite change.  HENT: Positive for congestion. Negative for postnasal drip and sore throat.   Eyes: Positive for redness (right inner  eye). Negative for visual disturbance.  Respiratory: Positive for shortness of breath (wearing oxygen at 3L at bedtime and PRN during the day). Negative for chest tightness and wheezing.   Cardiovascular: Negative for chest pain, palpitations and leg swelling.  Gastrointestinal: Negative for abdominal distention and abdominal pain.  Endocrine: Negative.   Genitourinary: Negative.   Musculoskeletal: Positive for arthralgias (left leg phantom pain).  Skin: Negative.   Allergic/Immunologic: Negative.   Neurological: Positive for dizziness (last night), weakness (right leg) and headaches (today). Negative for light-headedness.  Hematological: Negative for adenopathy. Does not bruise/bleed easily.  Psychiatric/Behavioral: Positive for sleep disturbance (due to left leg phantom pain; wearing CPAP nightly). Negative for dysphoric mood. The patient is not nervous/anxious.    Vitals:   11/18/17 1155  BP: 112/78  Pulse: 64  Resp: 18  SpO2: 100%  Weight: 210 lb (95.3 kg)  Height: 6\' 2"  (1.88 m)   Wt Readings from Last 3 Encounters:  11/18/17 210 lb (95.3 kg)  10/25/17 210 lb (95.3 kg)  09/19/17 190 lb 6.4 oz (86.4 kg)   Lab Results  Component Value Date   CREATININE 6.80 (H) 09/19/2017   CREATININE 7.48 (H) 09/18/2017   CREATININE 13.68 (H) 07/25/2017    Physical Exam  Constitutional: He is oriented to person, place, and time. He appears well-developed and well-nourished.  HENT:  Head: Normocephalic and atraumatic.  Neck:  Normal range of motion. Neck supple. No JVD present.  Cardiovascular: Normal rate and regular rhythm.  Pulmonary/Chest: Effort normal. He has no wheezes. He has no rales.  Abdominal: Soft. He exhibits no distension. There is no tenderness.  Musculoskeletal: He exhibits edema (1+ pitting in right lower leg). He exhibits no tenderness.  Neurological: He is alert and oriented to person, place, and time.  Skin: Skin is warm and dry.  Psychiatric: He has a normal mood and affect. His behavior is normal. Thought content normal.  Nursing note and vitals reviewed.  Assessment & Plan:  1: Chronic heart failure with reduced ejection fraction- - NYHA class III - minimally fluid overloaded today - not being weighed due to left BKA and weakness in his right lower leg being unable to hold him up - not adding salt to his food and he says that [employer] with salt either - is receiving physical therapy daily to assist with walking - saw cardiology Lyndel Pleasure) 10/30/17 - saw pulmonologist Ashby Dawes) 10/28/17 - tachycardia better since carvedilol was increased  - due to history of hyperkalemia, doubtful we can use entresto - BNP on 09/18/17 was 754.0  2: HTN- - BP low today so will not change his imdur to bidil today - sees PCP Clide Deutscher) at [place] on 10/30/17 reviewed and showed sodium 142, potassium 5.0 and GFR 8  3: ESRD- - receives dialysis [facility], [facility], [facility] - saw vascular Judithann Sheen) 08/05/17  4: Tobacco use- - smoking 3 cigarettes daily - says that he quit in the past for 3 months but resumed due to stress - complete cessation discussed for 3 minutes with him  Facility medication list was reviewed.  Return in 4 months or sooner for any questions/problems before then.

## 2017-11-25 ENCOUNTER — Encounter (INDEPENDENT_AMBULATORY_CARE_PROVIDER_SITE_OTHER): Payer: Medicare Other

## 2017-11-25 ENCOUNTER — Ambulatory Visit (INDEPENDENT_AMBULATORY_CARE_PROVIDER_SITE_OTHER): Payer: Medicare Other | Admitting: Vascular Surgery

## 2017-12-01 NOTE — Progress Notes (Signed)
[facility] Consultation      Assessment and Plan:  Severe obstructive sleep apnea. --Split-night sleep study 08/05/2017; AHI greater than 40 -  started on auto BiPAP with EPAP of 12, maximum EPAP of 20, pressure support of 5. -Pt is intolerant of PAP and not able to use after multiple attempts. --Continue with oxygen at night.    Essential hypertension, diabetes mellitus, systolic congestive heart failure. -These conditions can contribute to obstructive sleep apnea, and are contributing to by obstructive sleep apnea, therefore appropriate treatment the patient's underlying sleep apnea is an important part of their management.  Nicotine abuse. -Discussed the importance of smoke cessation on his health.  Spent greater than 3 minutes in discussion.  Return return if you have trouble breathing. .    Date: 12/01/2017  MRN# [id number] [name] [birth year]    [name] is a 62 y.o. old male seen in consultation for chief complaint of:    Chief Complaint  Patient presents with  . Sleep Apnea    pt is still not able to use cpap machine.  . smoker    he is still a smoker.    Synopsis: The patient is a 62 year old male with a history of copd, continued nicotine above, persistent right pleural effusion, ascites, history of CAD with chronic systolic congestive heart failure, CHF (EF 20 %), hypertension, hyperlipidemia, history of PE, end-stage renal disease on hemodialysis, peripheral vascular disease status post left below the knee amputation.  He also has a history of right thoracentesis 09/08/13 at [facility].  Previous history of sleep apnea, was intolerant and stopped using CPAP.  Since last visit: Patient has had 3 admissions to the [facility] in the past 6 months for multiple problems including CHF exacerbation, volume overload from missed hemodialysis.  Last visit he was not using BiPAP regularly due to concerns about cleaning the machine.  He was advised on  proper machine hygiene, asked to follow-up after 1 month to demonstrate good Pap compliance. Since then he has used it for only one night in the past month for 27 minutes and feels that he can not use it. He is using oxygen at 2L at night.   He continues to smoke 3 cigs per day, one at each meal. He has quit in the past but then restarted. He is thinking about quitting but not yet ready.    **Review of download data 10/20/2017 to 11/18/2017>> personally reviewed, usage greater than 4 hours is 0 out of 30 days.  Patient to use the machine one night for 27 minutes setting 20/12.  Residual AHI 11.1.  This shows noncompliance with BiPAP with continued apneas. **I personally reviewed download data 30 days as of 10/13/2017.  Usage greater than 4 hours is 1 day.  Average usage on days used is 2 hours 32 minutes.  Current settings 20/12.  Residual AHI is 34, predominantly obstructive.  This shows poor compliance with poor control of sleep apnea. **Split-night sleep study 08/05/2017; AHI remained elevated above 40 at all CPAP and BiPAP levels until the pressure 22/16 when AHI dropped to 2.1.  12 minutes of supine REM was achieved at a pressure of 19/12.  Patient was prescribed auto BiPAP with EPAP of 12, maximum EPAP of 20, pressure support of 5.  Social Hx:   Social History   Tobacco Use  . Smoking status: Light Tobacco Smoker    Packs/day: 0.25    Years: 30.00    Pack years: 7.50  Types: Cigarettes  . Smokeless tobacco: Never Used  . Tobacco comment: 5  Substance Use Topics  . Alcohol use: No    Alcohol/week: 0.0 oz  . Drug use: No    Comment: has used crack cocaine in past    Medication:    Current Outpatient Medications:  .  acetaminophen (TYLENOL) 500 MG tablet, Take 500 mg by mouth every 6 (six) hours as needed for mild pain. , Disp: , Rfl:  .  albuterol (PROVENTIL HFA;VENTOLIN HFA) 108 (90 Base) MCG/ACT inhaler, Inhale 1-2 puffs every 6 (six) hours as needed into the lungs for wheezing or  shortness of breath., Disp: , Rfl:  .  apixaban (ELIQUIS) 5 MG TABS tablet, Take 1 tablet (5 mg total) by mouth 2 (two) times daily., Disp: 60 tablet, Rfl: 2 .  aspirin EC 325 MG tablet, Take 325 mg by mouth daily. , Disp: , Rfl:  .  atorvastatin (LIPITOR) 10 MG tablet, Take 10 mg by mouth at bedtime. , Disp: , Rfl:  .  budesonide-formoterol (SYMBICORT) 160-4.5 MCG/ACT inhaler, Inhale 2 puffs into the lungs 2 (two) times daily., Disp: , Rfl:  .  calcium acetate (PHOSLO) 667 MG capsule, Take 667 mg by mouth daily. , Disp: , Rfl:  .  carvedilol (COREG) 6.25 MG tablet, Take 1 tablet (6.25 mg total) by mouth 2 (two) times daily., Disp: 180 tablet, Rfl: 3 .  cetirizine (ZYRTEC) 10 MG tablet, Take 10 mg by mouth daily., Disp: , Rfl:  .  docusate sodium (COLACE) 100 MG capsule, Take 100 mg by mouth daily as needed for mild constipation or moderate constipation. , Disp: , Rfl:  .  erythromycin ophthalmic ointment, Place 1 application into the right eye at bedtime., Disp: , Rfl:  .  fluticasone (FLONASE) 50 MCG/ACT nasal spray, Place 1 spray daily into the nose. , Disp: , Rfl:  .  guaifenesin (ROBAFEN) 100 MG/5ML syrup, Take 200 mg by mouth 3 (three) times daily as needed for cough., Disp: , Rfl:  .  hydrocortisone 2.5 % cream, Apply 1 application topically as needed., Disp: , Rfl:  .  Ipratropium-Albuterol (COMBIVENT RESPIMAT) 20-100 MCG/ACT AERS respimat, Inhale 1 puff into the lungs every 6 (six) hours as needed for wheezing or shortness of breath., Disp: , Rfl:  .  isosorbide mononitrate (IMDUR) 30 MG 24 hr tablet, Take 30 mg by mouth daily. , Disp: , Rfl:  .  midodrine (PROAMATINE) 10 MG tablet, Take 5 mg by mouth 2 (two) times daily., Disp: , Rfl:  .  Multiple Vitamin (MULTIVITAMIN WITH MINERALS) TABS tablet, Take 1 tablet by mouth 2 (two) times daily., Disp: , Rfl:  .  Multiple Vitamins-Minerals (THERA-M PO), Take 1 tablet by mouth 2 (two) times daily., Disp: , Rfl:  .  nitroGLYCERIN (NITROSTAT) 0.4  MG SL tablet, Place 0.4 mg under the tongue every 5 (five) minutes as needed for chest pain., Disp: , Rfl:  .  pantoprazole (PROTONIX) 20 MG tablet, Take 20 mg by mouth daily., Disp: , Rfl:  .  patiromer (VELTASSA) 8.4 g packet, Take 1 packet (8.4 g total) by mouth daily., Disp: 30 packet, Rfl: 0 .  polyethylene glycol (MIRALAX / GLYCOLAX) packet, Take 17 g by mouth daily as needed for mild constipation. , Disp: , Rfl:  .  pregabalin (LYRICA) 75 MG capsule, Take 1 capsule (75 mg total) by mouth daily., Disp: 20 capsule, Rfl: 0 .  sevelamer carbonate (RENVELA) 800 MG tablet, Take 800 mg by mouth 3 (  three) times daily with meals. , Disp: , Rfl:  .  sodium polystyrene (KAYEXALATE) powder, Take 15 g by mouth once., Disp: , Rfl:  .  tiotropium (SPIRIVA) 18 MCG inhalation capsule, Place 18 mcg into inhaler and inhale daily., Disp: , Rfl:    Allergies:  Dust mite extract  Review of Systems: Gen:  Denies  fever, sweats, chills HEENT: Denies blurred vision, double vision. bleeds, sore throat Cvc:  No dizziness, chest pain. Resp:   Denies cough or sputum production, shortness of breath Gi: Denies swallowing difficulty, stomach pain. Gu:  Denies bladder incontinence, burning urine Ext:   No Joint pain, stiffness. Skin: No skin rash,  hives  Endoc:  No polyuria, polydipsia. Psych: No depression, insomnia. Other:  All other systems were reviewed with the patient and were negative other that what is mentioned in the HPI.   Physical Examination:   VS: BP 112/80 (BP Location: Left Arm, Cuff Size: Normal)   Pulse 70   Resp 16   Ht 6\' 2"  (1.88 m)   SpO2 100%   BMI 26.96 kg/m   General Appearance: No distress  Neuro:without focal findings,  speech normal,  HEENT: PERRLA, EOM intact.  Mallampati 4 Pulmonary: normal breath sounds, No wheezing.  CardiovascularNormal S1,S2.  No m/r/g.   Abdomen: Benign, Soft, non-tender. Renal:  No costovertebral tenderness  GU:  No performed at this time. Endoc:  No evident thyromegaly, no signs of acromegaly. Skin:   warm, no rashes, no ecchymosis  Extremities: normal, no cyanosis, left foot amputation  Other findings:    LABORATORY PANEL:   CBC No results for input(s): WBC, HGB, HCT, PLT in the last 168 hours. ------------------------------------------------------------------------------------------------------------------  Chemistries  No results for input(s): NA, K, CL, CO2, GLUCOSE, BUN, CREATININE, CALCIUM, MG, AST, ALT, ALKPHOS, BILITOT in the last 168 hours.  Invalid input(s): GFRCGP ------------------------------------------------------------------------------------------------------------------  Cardiac Enzymes No results for input(s): TROPONINI in the last 168 hours. ------------------------------------------------------------  RADIOLOGY:  No results found.     Thank  you for the consultation and for allowing [facility] to assist in the care of your patient. Our recommendations are noted above.  Please contact us if we can be of further service.   Marda Stalker, M.D., F.C.C.P.  Board Certified in Internal Medicine, Pulmonary Medicine, [place], and Sleep Medicine.  [facility] Office Number: [phone]   12/02/2017

## 2017-12-02 ENCOUNTER — Ambulatory Visit (INDEPENDENT_AMBULATORY_CARE_PROVIDER_SITE_OTHER): Payer: Medicare Other | Admitting: Internal Medicine

## 2017-12-02 ENCOUNTER — Encounter: Payer: Self-pay | Admitting: Internal Medicine

## 2017-12-02 VITALS — BP 112/80 | HR 70 | Resp 16 | Ht 74.0 in

## 2017-12-02 DIAGNOSIS — G4733 Obstructive sleep apnea (adult) (pediatric): Secondary | ICD-10-CM | POA: Diagnosis not present

## 2017-12-02 DIAGNOSIS — F17210 Nicotine dependence, cigarettes, uncomplicated: Secondary | ICD-10-CM | POA: Diagnosis not present

## 2017-12-02 NOTE — Patient Instructions (Addendum)
Continue using oxygen at night.   --Quitting smoking is the most important thing that you can do for your health.  --Quitting smoking will have greater affect on your health than any medicine that we can give you.

## 2017-12-21 ENCOUNTER — Inpatient Hospital Stay
Admission: EM | Admit: 2017-12-21 | Discharge: 2017-12-23 | DRG: 871 | Disposition: A | Discharge status: Home / Self Care | Payer: Medicare Other | Attending: Specialist | Admitting: Specialist

## 2017-12-21 ENCOUNTER — Other Ambulatory Visit: Payer: Self-pay

## 2017-12-21 ENCOUNTER — Emergency Department: Payer: Medicare Other

## 2017-12-21 DIAGNOSIS — N2581 Secondary hyperparathyroidism of renal origin: Secondary | ICD-10-CM | POA: Diagnosis present

## 2017-12-21 DIAGNOSIS — A419 Sepsis, unspecified organism: Principal | ICD-10-CM | POA: Diagnosis present

## 2017-12-21 DIAGNOSIS — Z806 Family history of leukemia: Secondary | ICD-10-CM

## 2017-12-21 DIAGNOSIS — I252 Old myocardial infarction: Secondary | ICD-10-CM

## 2017-12-21 DIAGNOSIS — J9621 Acute and chronic respiratory failure with hypoxia: Secondary | ICD-10-CM | POA: Diagnosis present

## 2017-12-21 DIAGNOSIS — I429 Cardiomyopathy, unspecified: Secondary | ICD-10-CM | POA: Diagnosis present

## 2017-12-21 DIAGNOSIS — Z87442 Personal history of urinary calculi: Secondary | ICD-10-CM

## 2017-12-21 DIAGNOSIS — R0602 Shortness of breath: Secondary | ICD-10-CM | POA: Diagnosis present

## 2017-12-21 DIAGNOSIS — G4733 Obstructive sleep apnea (adult) (pediatric): Secondary | ICD-10-CM | POA: Diagnosis present

## 2017-12-21 DIAGNOSIS — Z992 Dependence on renal dialysis: Secondary | ICD-10-CM | POA: Diagnosis not present

## 2017-12-21 DIAGNOSIS — Y95 Nosocomial condition: Secondary | ICD-10-CM | POA: Diagnosis present

## 2017-12-21 DIAGNOSIS — I2510 Atherosclerotic heart disease of native coronary artery without angina pectoris: Secondary | ICD-10-CM | POA: Diagnosis present

## 2017-12-21 DIAGNOSIS — Z9981 Dependence on supplemental oxygen: Secondary | ICD-10-CM

## 2017-12-21 DIAGNOSIS — Z7982 Long term (current) use of aspirin: Secondary | ICD-10-CM

## 2017-12-21 DIAGNOSIS — J440 Chronic obstructive pulmonary disease with acute lower respiratory infection: Secondary | ICD-10-CM | POA: Diagnosis present

## 2017-12-21 DIAGNOSIS — J159 Unspecified bacterial pneumonia: Secondary | ICD-10-CM | POA: Diagnosis present

## 2017-12-21 DIAGNOSIS — Z86711 Personal history of pulmonary embolism: Secondary | ICD-10-CM

## 2017-12-21 DIAGNOSIS — F17210 Nicotine dependence, cigarettes, uncomplicated: Secondary | ICD-10-CM | POA: Diagnosis present

## 2017-12-21 DIAGNOSIS — I132 Hypertensive heart and chronic kidney disease with heart failure and with stage 5 chronic kidney disease, or end stage renal disease: Secondary | ICD-10-CM | POA: Diagnosis present

## 2017-12-21 DIAGNOSIS — E785 Hyperlipidemia, unspecified: Secondary | ICD-10-CM | POA: Diagnosis present

## 2017-12-21 DIAGNOSIS — I739 Peripheral vascular disease, unspecified: Secondary | ICD-10-CM | POA: Diagnosis present

## 2017-12-21 DIAGNOSIS — Z9109 Other allergy status, other than to drugs and biological substances: Secondary | ICD-10-CM

## 2017-12-21 DIAGNOSIS — Z89512 Acquired absence of left leg below knee: Secondary | ICD-10-CM | POA: Diagnosis not present

## 2017-12-21 DIAGNOSIS — Z7951 Long term (current) use of inhaled steroids: Secondary | ICD-10-CM | POA: Diagnosis not present

## 2017-12-21 DIAGNOSIS — K219 Gastro-esophageal reflux disease without esophagitis: Secondary | ICD-10-CM | POA: Diagnosis present

## 2017-12-21 DIAGNOSIS — J441 Chronic obstructive pulmonary disease with (acute) exacerbation: Secondary | ICD-10-CM | POA: Diagnosis present

## 2017-12-21 DIAGNOSIS — Z8249 Family history of ischemic heart disease and other diseases of the circulatory system: Secondary | ICD-10-CM

## 2017-12-21 DIAGNOSIS — Z7901 Long term (current) use of anticoagulants: Secondary | ICD-10-CM

## 2017-12-21 DIAGNOSIS — I5022 Chronic systolic (congestive) heart failure: Secondary | ICD-10-CM | POA: Diagnosis present

## 2017-12-21 DIAGNOSIS — J189 Pneumonia, unspecified organism: Secondary | ICD-10-CM

## 2017-12-21 DIAGNOSIS — N186 End stage renal disease: Secondary | ICD-10-CM | POA: Diagnosis present

## 2017-12-21 DIAGNOSIS — D631 Anemia in chronic kidney disease: Secondary | ICD-10-CM | POA: Diagnosis present

## 2017-12-21 LAB — MRSA PCR SCREENING: MRSA by PCR: POSITIVE — AB

## 2017-12-21 LAB — COMPREHENSIVE METABOLIC PANEL
ALBUMIN: 3 g/dL — AB (ref 3.5–5.0)
ALK PHOS: 159 U/L — AB (ref 38–126)
ALT: 35 U/L (ref 0–44)
ANION GAP: 11 (ref 5–15)
AST: 42 U/L — ABNORMAL HIGH (ref 15–41)
BUN: 67 mg/dL — ABNORMAL HIGH (ref 8–23)
CALCIUM: 9.1 mg/dL (ref 8.9–10.3)
CHLORIDE: 101 mmol/L (ref 98–111)
CO2: 27 mmol/L (ref 22–32)
Creatinine, Ser: 9.41 mg/dL — ABNORMAL HIGH (ref 0.61–1.24)
GFR calc non Af Amer: 5 mL/min — ABNORMAL LOW (ref >60)
GFR, EST AFRICAN AMERICAN: 6 mL/min — AB (ref >60)
GLUCOSE: 96 mg/dL (ref 70–99)
Potassium: 5 mmol/L (ref 3.5–5.1)
Sodium: 139 mmol/L (ref 135–145)
Total Bilirubin: 0.6 mg/dL (ref 0.3–1.2)
Total Protein: 7.9 g/dL (ref 6.5–8.1)

## 2017-12-21 LAB — CBC WITH DIFFERENTIAL/PLATELET
Basophils Absolute: 0.1 10*3/uL (ref 0–0.1)
Basophils Relative: 1 %
Eosinophils Absolute: 0.2 10*3/uL (ref 0–0.7)
Eosinophils Relative: 2 %
HEMATOCRIT: 42.3 % (ref 40.0–52.0)
HEMOGLOBIN: 13.7 g/dL (ref 13.0–18.0)
Lymphocytes Relative: 14 %
Lymphs Abs: 1.6 10*3/uL (ref 1.0–3.6)
MCH: 30.4 pg (ref 26.0–34.0)
MCHC: 32.4 g/dL (ref 32.0–36.0)
MCV: 93.9 fL (ref 80.0–100.0)
MONO ABS: 1.4 10*3/uL — AB (ref 0.2–1.0)
MONOS PCT: 12 %
NEUTROS ABS: 8.1 10*3/uL — AB (ref 1.4–6.5)
NEUTROS PCT: 71 %
Platelets: 169 10*3/uL (ref 150–440)
RBC: 4.5 MIL/uL (ref 4.40–5.90)
RDW: 15.9 % — AB (ref 11.5–14.5)
WBC: 11.5 10*3/uL — ABNORMAL HIGH (ref 3.8–10.6)

## 2017-12-21 LAB — BRAIN NATRIURETIC PEPTIDE: B Natriuretic Peptide: 842 pg/mL — ABNORMAL HIGH (ref 0.0–100.0)

## 2017-12-21 LAB — TROPONIN I: TROPONIN I: 0.08 ng/mL — AB (ref <0.03)

## 2017-12-21 LAB — LACTIC ACID, PLASMA: LACTIC ACID, VENOUS: 1.5 mmol/L (ref 0.5–1.9)

## 2017-12-21 MED ORDER — SODIUM CHLORIDE 0.9 % IV SOLN
1.0000 g | INTRAVENOUS | Status: DC
  Administered 2017-12-22: 1 g via INTRAVENOUS
  Filled 2017-12-21 (×3): qty 1

## 2017-12-21 MED ORDER — SODIUM CHLORIDE 0.9 % IV SOLN
2.0000 g | Freq: Once | INTRAVENOUS | Status: DC
  Filled 2017-12-21: qty 2

## 2017-12-21 MED ORDER — ONDANSETRON HCL 4 MG PO TABS: 4.0000 mg | ORAL_TABLET | Freq: Four times a day (QID) | ORAL | Status: DC | PRN

## 2017-12-21 MED ORDER — FLUTICASONE PROPIONATE 50 MCG/ACT NA SUSP
1.0000 | Freq: Every day | NASAL | Status: DC
  Administered 2017-12-21 – 2017-12-23 (×3): 1 via NASAL
  Filled 2017-12-21: qty 16

## 2017-12-21 MED ORDER — CARVEDILOL 6.25 MG PO TABS
6.2500 mg | ORAL_TABLET | Freq: Two times a day (BID) | ORAL | Status: DC
  Administered 2017-12-21 – 2017-12-23 (×4): 6.25 mg via ORAL
  Filled 2017-12-21 (×4): qty 1

## 2017-12-21 MED ORDER — ACETAMINOPHEN 500 MG PO TABS
1000.0000 mg | ORAL_TABLET | ORAL | Status: AC
  Administered 2017-12-21: 1000 mg via ORAL

## 2017-12-21 MED ORDER — PANTOPRAZOLE SODIUM 40 MG PO TBEC
40.0000 mg | DELAYED_RELEASE_TABLET | Freq: Every day | ORAL | Status: DC
  Administered 2017-12-22 – 2017-12-23 (×2): 40 mg via ORAL
  Filled 2017-12-21 (×2): qty 1

## 2017-12-21 MED ORDER — VANCOMYCIN HCL IN DEXTROSE 1-5 GM/200ML-% IV SOLN
1000.0000 mg | INTRAVENOUS | Status: DC
  Administered 2017-12-22 (×2): 1000 mg via INTRAVENOUS
  Filled 2017-12-21: qty 200

## 2017-12-21 MED ORDER — ONDANSETRON HCL 4 MG/2ML IJ SOLN: 4.0000 mg | Freq: Four times a day (QID) | INTRAMUSCULAR | Status: DC | PRN

## 2017-12-21 MED ORDER — VANCOMYCIN HCL IN DEXTROSE 1-5 GM/200ML-% IV SOLN
1000.0000 mg | Freq: Once | INTRAVENOUS | Status: AC
  Administered 2017-12-21: 1000 mg via INTRAVENOUS
  Filled 2017-12-21: qty 200

## 2017-12-21 MED ORDER — MOMETASONE FURO-FORMOTEROL FUM 200-5 MCG/ACT IN AERO
2.0000 | INHALATION_SPRAY | Freq: Two times a day (BID) | RESPIRATORY_TRACT | Status: DC
  Administered 2017-12-21 – 2017-12-23 (×5): 2 via RESPIRATORY_TRACT
  Filled 2017-12-21: qty 8.8

## 2017-12-21 MED ORDER — ACETAMINOPHEN 325 MG PO TABS: 650.0000 mg | ORAL_TABLET | Freq: Four times a day (QID) | ORAL | Status: DC | PRN

## 2017-12-21 MED ORDER — DOCUSATE SODIUM 100 MG PO CAPS: 100.0000 mg | ORAL_CAPSULE | Freq: Every day | ORAL | Status: DC | PRN

## 2017-12-21 MED ORDER — MIDODRINE HCL 5 MG PO TABS
5.0000 mg | ORAL_TABLET | Freq: Two times a day (BID) | ORAL | Status: DC
Start: 2017-12-21 — End: 2017-12-23
  Administered 2017-12-21 – 2017-12-23 (×4): 5 mg via ORAL
  Filled 2017-12-21 (×5): qty 1

## 2017-12-21 MED ORDER — ATORVASTATIN CALCIUM 10 MG PO TABS
10.0000 mg | ORAL_TABLET | Freq: Every day | ORAL | Status: DC
  Administered 2017-12-21 – 2017-12-22 (×2): 10 mg via ORAL
  Filled 2017-12-21 (×2): qty 1

## 2017-12-21 MED ORDER — CHLORHEXIDINE GLUCONATE CLOTH 2 % EX PADS
6.0000 | MEDICATED_PAD | Freq: Every day | CUTANEOUS | Status: DC
  Administered 2017-12-22: 6 via TOPICAL
  Filled 2017-12-21: qty 6

## 2017-12-21 MED ORDER — APIXABAN 5 MG PO TABS
5.0000 mg | ORAL_TABLET | Freq: Two times a day (BID) | ORAL | Status: DC
  Administered 2017-12-21 – 2017-12-23 (×5): 5 mg via ORAL
  Filled 2017-12-21 (×6): qty 1

## 2017-12-21 MED ORDER — CALCIUM ACETATE (PHOS BINDER) 667 MG PO CAPS
667.0000 mg | ORAL_CAPSULE | Freq: Every day | ORAL | Status: DC
  Administered 2017-12-21 – 2017-12-23 (×3): 667 mg via ORAL
  Filled 2017-12-21 (×3): qty 1

## 2017-12-21 MED ORDER — SODIUM CHLORIDE 0.9 % IV SOLN
INTRAVENOUS | Status: AC
  Administered 2017-12-21: 08:00:00
  Filled 2017-12-21: qty 2

## 2017-12-21 MED ORDER — POLYETHYLENE GLYCOL 3350 17 G PO PACK: 17.0000 g | PACK | Freq: Every day | ORAL | Status: DC | PRN

## 2017-12-21 MED ORDER — PATIROMER SORBITEX CALCIUM 8.4 G PO PACK
8.4000 g | PACK | Freq: Every day | ORAL | Status: DC
  Administered 2017-12-21 – 2017-12-22 (×2): 8.4 g via ORAL
  Filled 2017-12-21 (×3): qty 1

## 2017-12-21 MED ORDER — PREGABALIN 75 MG PO CAPS
75.0000 mg | ORAL_CAPSULE | Freq: Every day | ORAL | Status: DC
  Administered 2017-12-21 – 2017-12-23 (×3): 75 mg via ORAL
  Filled 2017-12-21 (×3): qty 1

## 2017-12-21 MED ORDER — IPRATROPIUM-ALBUTEROL 0.5-2.5 (3) MG/3ML IN SOLN
3.0000 mL | Freq: Once | RESPIRATORY_TRACT | Status: AC
  Administered 2017-12-21: 3 mL via RESPIRATORY_TRACT
  Filled 2017-12-21: qty 3

## 2017-12-21 MED ORDER — ACETAMINOPHEN 650 MG RE SUPP: 650.0000 mg | Freq: Four times a day (QID) | RECTAL | Status: DC | PRN

## 2017-12-21 MED ORDER — TIOTROPIUM BROMIDE MONOHYDRATE 18 MCG IN CAPS
18.0000 ug | ORAL_CAPSULE | Freq: Every day | RESPIRATORY_TRACT | Status: DC
  Administered 2017-12-21 – 2017-12-23 (×3): 18 ug via RESPIRATORY_TRACT
  Filled 2017-12-21: qty 5

## 2017-12-21 MED ORDER — METHYLPREDNISOLONE SODIUM SUCC 125 MG IJ SOLR
125.0000 mg | INTRAMUSCULAR | Status: AC
  Administered 2017-12-21: 125 mg via INTRAVENOUS
  Filled 2017-12-21: qty 2

## 2017-12-21 MED ORDER — ISOSORBIDE MONONITRATE ER 30 MG PO TB24
30.0000 mg | ORAL_TABLET | Freq: Every day | ORAL | Status: DC
  Administered 2017-12-21: 30 mg via ORAL
  Filled 2017-12-21 (×2): qty 1

## 2017-12-21 MED ORDER — NITROGLYCERIN 0.4 MG SL SUBL: 0.4000 mg | SUBLINGUAL_TABLET | SUBLINGUAL | Status: DC | PRN

## 2017-12-21 MED ORDER — LORATADINE 10 MG PO TABS
10.0000 mg | ORAL_TABLET | Freq: Every day | ORAL | Status: DC
  Administered 2017-12-21 – 2017-12-23 (×3): 10 mg via ORAL
  Filled 2017-12-21 (×3): qty 1

## 2017-12-21 MED ORDER — ALBUTEROL SULFATE (2.5 MG/3ML) 0.083% IN NEBU: 3.0000 mL | INHALATION_SOLUTION | Freq: Four times a day (QID) | RESPIRATORY_TRACT | Status: DC | PRN

## 2017-12-21 MED ORDER — SODIUM CHLORIDE 0.9 % IV SOLN: 1.0000 g | INTRAVENOUS | Status: DC

## 2017-12-21 MED ORDER — SEVELAMER CARBONATE 800 MG PO TABS
800.0000 mg | ORAL_TABLET | Freq: Three times a day (TID) | ORAL | Status: DC
  Administered 2017-12-21 – 2017-12-23 (×5): 800 mg via ORAL
  Filled 2017-12-21 (×6): qty 1

## 2017-12-21 MED ORDER — ACETAMINOPHEN 500 MG PO TABS
ORAL_TABLET | ORAL | Status: AC
  Filled 2017-12-21: qty 2

## 2017-12-21 NOTE — ED Provider Notes (Signed)
[facility] Emergency Department Provider Note   ____________________________________________   First MD Initiated Contact with Patient 12/21/17 0740     (approximate)  I have reviewed the triage vital signs and the nursing notes.   HISTORY  Chief Complaint Respiratory Distress  EM caveat: Patient very dyspneic, limits ability to obtain full history and review of systems  HPI [name] is a 62 y.o. male presents for evaluation for shortness of breath  Patient reports this morning been feeling very sick at about 4 in the morning.  Reports shaking chills fevers cough.  Reports very short of breath.  EMS reports on scene he had oxygen saturation about 70 to 80% on room air.  He was notably dyspneic.  Diminished lung sounds throughout.  Given nebulizer treatment, some slight relief.  Patient reports he feels a little better with oxygen.  EMS also reports he had a fever to 100.3 on axillary temperature, dyspnea.  Patient denies being in any pain.  Does report slight increase in swelling in his right lower leg as well as his abdomen, but reports he is been compliant with his dialysis having last had it Friday and does not feel like it is "fluid".  Denies wheezing.  Reports feeling slightly better with sitting up.  He has a recently placed left upper extremity fistula, also currently receiving dialysis through a right femoral dialysis catheter.   Past Medical History:  Diagnosis Date  . 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 Amputee, below knee, left ([place])   . Anemia   . Asthma   . Cardiomyopathy ([place])   . CHF (congestive heart failure) ([place])   . Chronic systolic heart failure ([place])   . Complication of anesthesia    hypotension  . COPD (chronic obstructive pulmonary disease) ([place])   . Coronary artery disease   . Dialysis patient ([place])    Mon, Wed, Fri  . End stage renal disease ([place])   . GERD (gastroesophageal reflux disease)   .  Headache   . History of kidney stones   . History of pulmonary embolism   . HLD (hyperlipidemia)   . HTN (hypertension)   . Hyperparathyroidism   . Myocardial infarction ([place])   . Peripheral vascular disease ([place])   . Shortness of breath dyspnea   . Sleep apnea    NO C-PAP, Patient stated in process of  "getting one"   . Tobacco dependence     Patient Active Problem List   Diagnosis Date Noted  . Tobacco use 10/07/2017  . Apnea   . End-stage renal disease on hemodialysis ([place])   . Hypervolemia   . Altered mental status   . Acute on chronic systolic CHF (congestive heart failure) ([place]) 01/18/2017  . Pulmonary embolism ([place]) 08/03/2016  . NSTEMI (non-ST elevated myocardial infarction) ([place]) 07/26/2016  . PVD (peripheral vascular disease) ([place]) 03/05/2016  . Preop examination 05/02/2015  . Chronic systolic heart failure ([place])   . Hyperkalemia 01/13/2015  . ESRD (end stage renal disease) on dialysis ([place]) 05/04/2014  . CAD (coronary artery disease) 05/04/2014  . Normocytic anemia 05/04/2014  . Hyperlipidemia 04/26/2010  . HYPERTENSION, BENIGN 04/26/2010  . CAD, NATIVE VESSEL 04/26/2010    Past Surgical History:  Procedure Laterality Date  . A/V FISTULAGRAM Left 08/14/2017   Procedure: A/V FISTULAGRAM;  Surgeon: Algernon Huxley, MD;  Location: [place] [facility];  Service: Cardiovascular;  Laterality: Left;  . AMPUTATION Left 05/06/2014   Procedure:  AMPUTATION BELOW KNEE;  Surgeon: Elam Dutch, MD;  Location: [place];  Service: Vascular;  Laterality: Left;  . AMPUTATION Right 01/12/2015   Procedure: Foot transmetatarsal amputation;  Surgeon: Algernon Huxley, MD;  Location: [facility];  Service: Vascular;  Laterality: Right;  . APPLICATION OF WOUND VAC Right 03/01/2015   Procedure: Application of Bio-connekt graft and wound vac application to right foot ;  Surgeon: Algernon Huxley, MD;  Location: [facility];  Service: Vascular;  Laterality: Right;  . AV FISTULA PLACEMENT Left   . AV  FISTULA PLACEMENT Left 11/28/2016   Procedure: ARTERIOVENOUS (AV) FISTULA CREATION;  Surgeon: Algernon Huxley, MD;  Location: [facility];  Service: Vascular;  Laterality: Left;  . CARDIAC CATHETERIZATION     stent placement   . CORONARY ANGIOPLASTY    . DIALYSIS/PERMA CATHETER INSERTION N/A 07/31/2016   Procedure: Dialysis/Perma Catheter Insertion;  Surgeon: Algernon Huxley, MD;  Location: [place] [facility];  Service: Cardiovascular;  Laterality: N/A;  . DIALYSIS/PERMA CATHETER INSERTION N/A 07/25/2017   Procedure: DIALYSIS/PERMA CATHETER INSERTION and fistulagram;  Surgeon: Algernon Huxley, MD;  Location: [place] [facility];  Service: Cardiovascular;  Laterality: N/A;  . DIALYSIS/PERMA CATHETER REMOVAL N/A 07/22/2017   Procedure: DIALYSIS/PERMA CATHETER REMOVAL;  Surgeon: Katha Cabal, MD;  Location: [place] [facility];  Service: Cardiovascular;  Laterality: N/A;  . IR FLUORO GUIDE CV LINE LEFT  04/10/2017  . LIGATION OF ARTERIOVENOUS  FISTULA Right 01/31/2016   Procedure: LIGATION OF ARTERIOVENOUS  FISTULA;  Surgeon: Algernon Huxley, MD;  Location: [facility];  Service: Vascular;  Laterality: Right;  . PERIPHERAL VASCULAR CATHETERIZATION Right 12/15/2014   Procedure: Lower Extremity Angiography;  Surgeon: Algernon Huxley, MD;  Location: [place] [facility];  Service: Cardiovascular;  Laterality: Right;  . PERIPHERAL VASCULAR CATHETERIZATION  12/15/2014   Procedure: Lower Extremity Intervention;  Surgeon: Algernon Huxley, MD;  Location: [place] [facility];  Service: Cardiovascular;;  . PERIPHERAL VASCULAR CATHETERIZATION Right 08/14/2015   Procedure: A/V Shuntogram/Fistulagram;  Surgeon: Algernon Huxley, MD;  Location: [place] [facility];  Service: Cardiovascular;  Laterality: Right;  . PERIPHERAL VASCULAR CATHETERIZATION N/A 08/14/2015   Procedure: A/V Shunt Intervention;  Surgeon: Algernon Huxley, MD;  Location: [place] [facility];  Service: Cardiovascular;  Laterality: N/A;  . PERIPHERAL VASCULAR CATHETERIZATION N/A  01/11/2016   Procedure: Dialysis/Perma Catheter Insertion;  Surgeon: Algernon Huxley, MD;  Location: [place] [facility];  Service: Cardiovascular;  Laterality: N/A;  . REVISON OF ARTERIOVENOUS FISTULA Right 02/17/2016   Procedure: removal of AV fistula;  Surgeon: Serafina Mitchell, MD;  Location: [facility];  Service: Vascular;  Laterality: Right;  . REVISON OF ARTERIOVENOUS FISTULA Right 01/31/2016   Procedure: REVISON OF ARTERIOVENOUS FISTULA ( BRACHIOCEPHALIC ) W/ ARTEGRAFT;  Surgeon: Algernon Huxley, MD;  Location: [facility];  Service: Vascular;  Laterality: Right;  . TRANSMETATARSAL AMPUTATION Right 05/04/2015   Procedure: TRANSMETATARSAL AMPUTATION REVISION, great toe amputation;  Surgeon: Algernon Huxley, MD;  Location: [facility];  Service: Vascular;  Laterality: Right;    Prior to Admission medications   Medication Sig Start Date End Date Taking? Authorizing Provider  acetaminophen (TYLENOL) 500 MG tablet Take 500 mg by mouth every 6 (six) hours as needed for mild pain.     [provider]  albuterol (PROVENTIL HFA;VENTOLIN HFA) 108 (90 Base) MCG/ACT inhaler Inhale 1-2 puffs every 6 (six) hours as needed into the lungs for wheezing or shortness of  breath.    [provider]  apixaban (ELIQUIS) 5 MG TABS tablet Take 1 tablet (5 mg total) by mouth 2 (two) times daily. 01/20/17   Gladstone Lighter, MD  aspirin EC 325 MG tablet Take 325 mg by mouth daily.     [provider]  atorvastatin (LIPITOR) 10 MG tablet Take 10 mg by mouth at bedtime.     [provider]  budesonide-formoterol (SYMBICORT) 160-4.5 MCG/ACT inhaler Inhale 2 puffs into the lungs 2 (two) times daily.    [provider]  calcium acetate (PHOSLO) 667 MG capsule Take 667 mg by mouth daily.     [provider]  carvedilol (COREG) 6.25 MG tablet Take 1 tablet (6.25 mg total) by mouth 2 (two) times daily. 10/07/17   Alisa Graff, FNP  cetirizine (ZYRTEC) 10 MG tablet Take 10 mg by mouth daily.     [provider]  docusate sodium (COLACE) 100 MG capsule Take 100 mg by mouth daily as needed for mild constipation or moderate constipation.     [provider]  erythromycin ophthalmic ointment Place 1 application into the right eye at bedtime.    [provider]  fluticasone (FLONASE) 50 MCG/ACT nasal spray Place 1 spray daily into the nose.     [provider]  guaifenesin (ROBAFEN) 100 MG/5ML syrup Take 200 mg by mouth 3 (three) times daily as needed for cough.    [provider]  hydrocortisone 2.5 % cream Apply 1 application topically as needed.    [provider]  Ipratropium-Albuterol (COMBIVENT RESPIMAT) 20-100 MCG/ACT AERS respimat Inhale 1 puff into the lungs every 6 (six) hours as needed for wheezing or shortness of breath.    [provider]  isosorbide mononitrate (IMDUR) 30 MG 24 hr tablet Take 30 mg by mouth daily.  08/22/16   [provider]  midodrine (PROAMATINE) 10 MG tablet Take 5 mg by mouth 2 (two) times daily.    [provider]  Multiple Vitamin (MULTIVITAMIN WITH MINERALS) TABS tablet Take 1 tablet by mouth 2 (two) times daily.    [provider]  Multiple Vitamins-Minerals (THERA-M PO) Take 1 tablet by mouth 2 (two) times daily.    [provider]  nitroGLYCERIN (NITROSTAT) 0.4 MG SL tablet Place 0.4 mg under the tongue every 5 (five) minutes as needed for chest pain.    [provider]  pantoprazole (PROTONIX) 20 MG tablet Take 20 mg by mouth daily.    [provider]  patiromer (VELTASSA) 8.4 g packet Take 1 packet (8.4 g total) by mouth daily. 07/26/17   Bettey Costa, MD  polyethylene glycol (MIRALAX / GLYCOLAX) packet Take 17 g by mouth daily as needed for mild constipation.     [provider]  pregabalin (LYRICA) 75 MG capsule Take 1 capsule (75 mg total) by mouth daily. 01/22/17   Gladstone Lighter, MD  sevelamer carbonate (RENVELA) 800 MG  tablet Take 800 mg by mouth 3 (three) times daily with meals.     [provider]  sodium polystyrene (KAYEXALATE) powder Take 15 g by mouth once.    [provider]  tiotropium (SPIRIVA) 18 MCG inhalation capsule Place 18 mcg into inhaler and inhale daily.    [provider]    Allergies Dust mite extract  Family History  Problem Relation Age of Onset  . Leukemia Mother   . Heart attack Father   . Heart failure Other   . Hypertension Other   .  Leukemia Other   . Diabetes Other   . Prostate cancer Neg Hx   . Kidney cancer Neg Hx   . Bladder Cancer Neg Hx     Social History Social History   Tobacco Use  . Smoking status: Light Tobacco Smoker    Packs/day: 0.25    Years: 30.00    Pack years: 7.50    Types: Cigarettes  . Smokeless tobacco: Never Used  . Tobacco comment: 5  Substance Use Topics  . Alcohol use: No    Alcohol/week: 0.0 oz  . Drug use: No    Comment: has used crack cocaine in past     Review of Systems Constitutional: Fevers chills and fatigue eyes: No visual changes. Cardiovascular: Denies chest pain. Respiratory: See HPI Gastrointestinal: No abdominal pain.  Reports abdomen seems slightly more swollen than normal. Musculoskeletal: Right leg feels slightly more swollen than normal, does swell up at times comes and goes. Skin: Negative for rash. Neurological: Negative for headaches or new weakness..    ____________________________________________   PHYSICAL EXAM:  VITAL SIGNS: ED Triage Vitals  Enc Vitals Group     BP      Pulse      Resp      Temp      Temp src      SpO2      Weight      Height      Head Circumference      Peak Flow      Pain Score      Pain Loc      Pain Edu?      Excl. in [place]?     Constitutional: Alert and oriented.  Moderately dyspneic.  Sitting upright.  Mild use of accessory muscles.  Oxygen saturation little low 90s initially on 4 L nasal cannula. Eyes: Conjunctivae are  normal. Head: Atraumatic. Nose: No congestion/rhinnorhea. Mouth/Throat: Mucous membranes are moist. Neck: No stridor.   Cardiovascular: Normal rate, regular rhythm. Grossly normal heart sounds.  Good peripheral circulation. Respiratory: Grossly diminished lung sounds in the bases bilaterally.  The upper lobes sound clear bilaterally.  There is no notable wheezing or crackles denoted. Gastrointestinal: Soft and nontender. No distention. Musculoskeletal: No lower extremity tenderness with previous left lower extremity amputation, right lower extremity just some slight edema noted around the calf. Neurologic:  Normal speech and language. No gross focal neurologic deficits are appreciated.  Skin:  Skin is cool and intact. No rash noted. Psychiatric: Mood and affect are normal. Speech and behavior are normal.  ____________________________________________   LABS (all labs ordered are listed, but only abnormal results are displayed)  Labs Reviewed  COMPREHENSIVE METABOLIC PANEL - Abnormal; Notable for the following components:      Result Value   BUN 67 (*)    Creatinine, Ser 9.41 (*)    Albumin 3.0 (*)    AST 42 (*)    Alkaline Phosphatase 159 (*)    GFR calc non Af Amer 5 (*)    GFR calc Af Amer 6 (*)    All other components within normal limits  BLOOD GAS, VENOUS - Abnormal; Notable for the following components:   Bicarbonate 29.2 (*)    All other components within normal limits  TROPONIN I - Abnormal; Notable for the following components:   Troponin I 0.08 (*)    All other components within normal limits  CULTURE, BLOOD (ROUTINE X 2)  CULTURE, BLOOD (ROUTINE X 2)  LACTIC ACID, PLASMA  CBC WITH  DIFFERENTIAL/PLATELET  URINALYSIS, ROUTINE W REFLEX MICROSCOPIC  LACTIC ACID, PLASMA  BRAIN NATRIURETIC PEPTIDE   ____________________________________________  EKG  Reviewed and try 7:40 AM Heart rate 100 QRS 140 QTc 490 Sinus tachycardia with left bundle branch  block. ____________________________________________  RADIOLOGY  Dg Chest Port 1 View  Result Date: 12/21/2017 CLINICAL DATA:  Respiratory distress. EXAM: PORTABLE CHEST 1 VIEW COMPARISON:  CT of the chest on 09/18/2017 FINDINGS: The heart is moderately enlarged. There is dense airspace opacification in the inferior aspect of the right upper lobe versus superior segment of the right lower lobe. Findings likely represent pneumonia. Underlying mass cannot be excluded. There is potentially a small right pleural effusion. Suspect mild interstitial edema. No pneumothorax. IMPRESSION: Right lung dense airspace opacification most likely representing pneumonia. Underlying mass cannot be excluded. No mass present in this region by CT 3 months ago. Suspect mild interstitial edema and small right pleural effusion. Electronically Signed   By: Aletta Edouard M.D.   On: 12/21/2017 08:08   Chest x-ray reviewed, right airspace opacification likely pneumonia.  See radiology report.  Viewed by me. ____________________________________________   PROCEDURES  Procedure(s) performed: None  Procedures  Critical Care performed: Yes, see critical care note(s)  CRITICAL CARE Performed by: Delman Kitten   Total critical care time: 40 minutes  Critical care time was exclusive of separately billable procedures and treating other patients.  Critical care was necessary to treat or prevent imminent or life-threatening deterioration.  Critical care was time spent personally by me on the following activities: development of treatment plan with patient and/or surrogate as well as nursing, discussions with consultants, evaluation of patient's response to treatment, examination of patient, obtaining history from patient or surrogate, ordering and performing treatments and interventions, ordering and review of laboratory studies, ordering and review of radiographic studies, pulse oximetry and re-evaluation of patient's  condition.  Patient noted to have tachycardia, tachypnea, and infiltrate on chest x-ray, in conjunction with his reported fever with EMS appears a constellation of findings with associated hypoxia and dyspnea are consistent with pneumonia.  He is on dialysis, will order broad-spectrum including vancomycin and cefepime.  Code sepsis initiated.  ____________________________________________   INITIAL IMPRESSION / ASSESSMENT AND PLAN / ED COURSE  Pertinent labs & imaging results that were available during my care of the patient were reviewed by me and considered in my medical decision making (see chart for details).  Dyspnea with chills.  Patient does have a history of previous pleural effusions, pneumonia, COPD, is currently on dialysis.  Broad differential for etiology of dyspnea today.  However his shaking chills, report of a cough, as well as fever starting this morning with a measured fever of 100.3 with EMS and seems highly suggestive of an infectious etiology.  Suspect possibly pulmonary or pneumonia and will empirically begin coverage for potential healthcare associated pneumonia given his presentation, also known dialysis catheter in the right groin.  Right groin site is clean dry and intact.  Differential diagnosis certainly also includes COPD, congestive heart failure, volume overload, recurrent pleural effusions.  Will initiate additional nebulizer treatment and Solu-Medrol given the known history as well as antibiotics.  Await chest x-ray and further evaluation.  Patient tolerating fairly well, does not appear to show tiring, but did discuss use of potential BiPAP for him and he reports he does not tolerate very well as he gets very anxious.  The present time I do not believe he requires it however.  We will continue to monitor him  closely with blood work.    ----------------------------------------- 8:07 AM on 12/21/2017 -----------------------------------------  Initiating code  sepsis, patient heart rate about 105, remains hypertensive without evidence of hypotension.  Awaiting lactate.  Broad-spectrum antibiotic coverage.  Chest x-ray concerning for pneumonia.  Patient does report he is feeling better on oxygen, appears to be comfortably resting and appears to have less work of breathing at this time.  He is fully alert.  Patient agreeable and understanding with plan for antibiotics and hospitalization.  ____________________________________________   FINAL CLINICAL IMPRESSION(S) / ED DIAGNOSES  Final diagnoses:  HCAP (healthcare-associated pneumonia)  Sepsis, due to unspecified organism ([place])  COPD exacerbation ([place])      NEW MEDICATIONS STARTED DURING THIS VISIT:  New Prescriptions   No medications on file     Note:  This document was prepared using Dragon voice recognition software and may include unintentional dictation errors.     Delman Kitten, MD 12/21/17 1539

## 2017-12-21 NOTE — ED Notes (Signed)
Report to Phylis, RN

## 2017-12-21 NOTE — H&P (Signed)
[facility] NAME: [name]    MR#:  [id number]  DATE OF BIRTH:  11-25-1955  DATE OF ADMISSION:  12/21/2017  PRIMARY CARE PHYSICIAN: Donnie Coffin, MD   REQUESTING/REFERRING PHYSICIAN: Dr. Delman Kitten  CHIEF COMPLAINT:   Chief Complaint  Patient presents with  . Respiratory Distress    HISTORY OF PRESENT ILLNESS:  [name]  is a 62 y.o. male with a known history of stage renal disease on hemodialysis, chronic systolic congestive heart failure, peripheral vascular disease status post left below the knee amputation, anemia of chronic disease, GERD, secondary hyperparathyroidism, essential hypertension, COPD with ongoing tobacco abuse, hyperlipidemia, obstructive sleep apnea who presents to the hospital due to chills, shortness of breath that began earlier this morning.  Patient says he was in his usual state of health when developed chills and shortness of breath earlier this morning which progressively got worse and he came to the ER for further evaluation.  In the ER patient was noted to have a fever of 102 and also was noted to be hypoxic, although patient is on chronic oxygen at home.  Patient had a chest x-ray done which is suggestive of right-sided pneumonia and therefore hospitalist services were contacted for treatment evaluation.  Patient denies any productive cough, chest pain, nausea, vomiting, abdominal pain, diarrhea melena, hematochezia or any other associated symptoms presently.  PAST MEDICAL HISTORY:   Past Medical History:  Diagnosis Date  . 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 Amputee, below knee, left ([place])   . Anemia   . Asthma   . Cardiomyopathy ([place])   . CHF (congestive heart failure) ([place])   . Chronic systolic heart failure ([place])   . Complication of anesthesia    hypotension  . COPD (chronic obstructive pulmonary disease) ([place])   . Coronary artery disease   . Dialysis patient ([place])     Mon, Wed, Fri  . End stage renal disease ([place])   . GERD (gastroesophageal reflux disease)   . Headache   . History of kidney stones   . History of pulmonary embolism   . HLD (hyperlipidemia)   . HTN (hypertension)   . Hyperparathyroidism   . Myocardial infarction ([place])   . Peripheral vascular disease ([place])   . Shortness of breath dyspnea   . Sleep apnea    NO C-PAP, Patient stated in process of  "getting one"   . Tobacco dependence     PAST SURGICAL HISTORY:   Past Surgical History:  Procedure Laterality Date  . A/V FISTULAGRAM Left 08/14/2017   Procedure: A/V FISTULAGRAM;  Surgeon: Algernon Huxley, MD;  Location: [facility];  Service: Cardiovascular;  Laterality: Left;  . AMPUTATION Left 05/06/2014   Procedure: AMPUTATION BELOW KNEE;  Surgeon: Elam Dutch, MD;  Location: [facility];  Service: Vascular;  Laterality: Left;  . AMPUTATION Right 01/12/2015   Procedure: Foot transmetatarsal amputation;  Surgeon: Algernon Huxley, MD;  Location: [facility];  Service: Vascular;  Laterality: Right;  . APPLICATION OF WOUND VAC Right 03/01/2015   Procedure: Application of Bio-connekt graft and wound vac application to right foot ;  Surgeon: Algernon Huxley, MD;  Location: [facility];  Service: Vascular;  Laterality: Right;  . AV FISTULA PLACEMENT Left   . AV FISTULA PLACEMENT Left 11/28/2016   Procedure: ARTERIOVENOUS (AV) FISTULA CREATION;  Surgeon: Algernon Huxley, MD;  Location: [facility];  Service: Vascular;  Laterality: Left;  . CARDIAC CATHETERIZATION     stent placement   . CORONARY ANGIOPLASTY    . DIALYSIS/PERMA CATHETER INSERTION N/A 07/31/2016   Procedure: Dialysis/Perma Catheter Insertion;  Surgeon: Algernon Huxley, MD;  Location: [facility];  Service: Cardiovascular;  Laterality: N/A;  . DIALYSIS/PERMA CATHETER INSERTION N/A 07/25/2017   Procedure: DIALYSIS/PERMA CATHETER INSERTION and fistulagram;  Surgeon: Algernon Huxley, MD;  Location: [facility];  Service: Cardiovascular;   Laterality: N/A;  . DIALYSIS/PERMA CATHETER REMOVAL N/A 07/22/2017   Procedure: DIALYSIS/PERMA CATHETER REMOVAL;  Surgeon: Katha Cabal, MD;  Location: [facility];  Service: Cardiovascular;  Laterality: N/A;  . IR FLUORO GUIDE CV LINE LEFT  04/10/2017  . LIGATION OF ARTERIOVENOUS  FISTULA Right 01/31/2016   Procedure: LIGATION OF ARTERIOVENOUS  FISTULA;  Surgeon: Algernon Huxley, MD;  Location: [facility];  Service: Vascular;  Laterality: Right;  . PERIPHERAL VASCULAR CATHETERIZATION Right 12/15/2014   Procedure: Lower Extremity Angiography;  Surgeon: Algernon Huxley, MD;  Location: [facility];  Service: Cardiovascular;  Laterality: Right;  . PERIPHERAL VASCULAR CATHETERIZATION  12/15/2014   Procedure: Lower Extremity Intervention;  Surgeon: Algernon Huxley, MD;  Location: [facility];  Service: Cardiovascular;;  . PERIPHERAL VASCULAR CATHETERIZATION Right 08/14/2015   Procedure: A/V Shuntogram/Fistulagram;  Surgeon: Algernon Huxley, MD;  Location: [facility];  Service: Cardiovascular;  Laterality: Right;  . PERIPHERAL VASCULAR CATHETERIZATION N/A 08/14/2015   Procedure: A/V Shunt Intervention;  Surgeon: Algernon Huxley, MD;  Location: [facility];  Service: Cardiovascular;  Laterality: N/A;  . PERIPHERAL VASCULAR CATHETERIZATION N/A 01/11/2016   Procedure: Dialysis/Perma Catheter Insertion;  Surgeon: Algernon Huxley, MD;  Location: [facility];  Service: Cardiovascular;  Laterality: N/A;  . REVISON OF ARTERIOVENOUS FISTULA Right 02/17/2016   Procedure: removal of AV fistula;  Surgeon: Serafina Mitchell, MD;  Location: [facility];  Service: Vascular;  Laterality: Right;  . REVISON OF ARTERIOVENOUS FISTULA Right 01/31/2016   Procedure: REVISON OF ARTERIOVENOUS FISTULA ( BRACHIOCEPHALIC ) W/ ARTEGRAFT;  Surgeon: Algernon Huxley, MD;  Location: [facility];  Service: Vascular;  Laterality: Right;  . TRANSMETATARSAL AMPUTATION Right 05/04/2015   Procedure: TRANSMETATARSAL AMPUTATION  REVISION, great toe amputation;  Surgeon: Algernon Huxley, MD;  Location: [facility];  Service: Vascular;  Laterality: Right;    SOCIAL HISTORY:   Social History   Tobacco Use  . Smoking status: Light Tobacco Smoker    Packs/day: 0.25    Years: 30.00    Pack years: 7.50    Types: Cigarettes  . Smokeless tobacco: Never Used  . Tobacco comment: 5  Substance Use Topics  . Alcohol use: No    Alcohol/week: 0.0 oz    FAMILY HISTORY:   Family History  Problem Relation Age of Onset  . Leukemia Mother   . Heart attack Father   . Heart failure Other   . Hypertension Other   . Leukemia Other   . Diabetes Other   . Prostate cancer Neg Hx   . Kidney cancer Neg Hx   . Bladder Cancer Neg Hx     DRUG ALLERGIES:   Allergies  Allergen Reactions  . Dust Mite Extract Other (See Comments)    Reaction: unknown    REVIEW OF SYSTEMS:   Review of Systems  Constitutional: Positive for chills and fever. Negative for weight loss.  HENT: Negative for congestion, nosebleeds and tinnitus.   Eyes:  Negative for blurred vision, double vision and redness.  Respiratory: Positive for shortness of breath. Negative for cough and hemoptysis.   Cardiovascular: Negative for chest pain, orthopnea, leg swelling and PND.  Gastrointestinal: Negative for abdominal pain, diarrhea, melena, nausea and vomiting.  Genitourinary: Negative for dysuria, hematuria and urgency.  Musculoskeletal: Negative for falls and joint pain.  Neurological: Negative for dizziness, tingling, sensory change, focal weakness, seizures, weakness and headaches.  Endo/Heme/Allergies: Negative for polydipsia. Does not bruise/bleed easily.  Psychiatric/Behavioral: Negative for depression and memory loss. The patient is not nervous/anxious.     MEDICATIONS AT HOME:   Prior to Admission medications   Medication Sig Start Date End Date Taking? Authorizing Provider  acetaminophen (TYLENOL) 500 MG tablet Take 500 mg by mouth every 6 (six)  hours as needed for mild pain.     [provider]  albuterol (PROVENTIL HFA;VENTOLIN HFA) 108 (90 Base) MCG/ACT inhaler Inhale 1-2 puffs every 6 (six) hours as needed into the lungs for wheezing or shortness of breath.    [provider]  apixaban (ELIQUIS) 5 MG TABS tablet Take 1 tablet (5 mg total) by mouth 2 (two) times daily. 01/20/17   Gladstone Lighter, MD  aspirin EC 325 MG tablet Take 325 mg by mouth daily.     [provider]  atorvastatin (LIPITOR) 10 MG tablet Take 10 mg by mouth at bedtime.     [provider]  budesonide-formoterol (SYMBICORT) 160-4.5 MCG/ACT inhaler Inhale 2 puffs into the lungs 2 (two) times daily.    [provider]  calcium acetate (PHOSLO) 667 MG capsule Take 667 mg by mouth daily.     [provider]  carvedilol (COREG) 6.25 MG tablet Take 1 tablet (6.25 mg total) by mouth 2 (two) times daily. 10/07/17   Alisa Graff, FNP  cetirizine (ZYRTEC) 10 MG tablet Take 10 mg by mouth daily.    [provider]  docusate sodium (COLACE) 100 MG capsule Take 100 mg by mouth daily as needed for mild constipation or moderate constipation.     [provider]  erythromycin ophthalmic ointment Place 1 application into the right eye at bedtime.    [provider]  fluticasone (FLONASE) 50 MCG/ACT nasal spray Place 1 spray daily into the nose.     [provider]  guaifenesin (ROBAFEN) 100 MG/5ML syrup Take 200 mg by mouth 3 (three) times daily as needed for cough.    [provider]  hydrocortisone 2.5 % cream Apply 1 application topically as needed.    [provider]  Ipratropium-Albuterol (COMBIVENT RESPIMAT) 20-100 MCG/ACT AERS respimat Inhale 1 puff into the lungs every 6 (six) hours as needed for wheezing or shortness of breath.    [provider]  isosorbide mononitrate (IMDUR) 30 MG 24 hr tablet Take 30 mg by mouth daily.  08/22/16   [provider]   midodrine (PROAMATINE) 10 MG tablet Take 5 mg by mouth 2 (two) times daily.    [provider]  Multiple Vitamin (MULTIVITAMIN WITH MINERALS) TABS tablet Take 1 tablet by mouth 2 (two) times daily.    [provider]  Multiple Vitamins-Minerals (THERA-M PO) Take 1 tablet by mouth 2 (two) times daily.    [provider]  nitroGLYCERIN (NITROSTAT) 0.4 MG SL tablet Place 0.4 mg under the tongue every 5 (five) minutes as needed for chest pain.    [provider]  pantoprazole (PROTONIX) 20 MG tablet Take 20 mg by mouth daily.  [provider]  patiromer (VELTASSA) 8.4 g packet Take 1 packet (8.4 g total) by mouth daily. 07/26/17   Bettey Costa, MD  polyethylene glycol (MIRALAX / GLYCOLAX) packet Take 17 g by mouth daily as needed for mild constipation.     [provider]  pregabalin (LYRICA) 75 MG capsule Take 1 capsule (75 mg total) by mouth daily. 01/22/17   Gladstone Lighter, MD  sevelamer carbonate (RENVELA) 800 MG tablet Take 800 mg by mouth 3 (three) times daily with meals.     [provider]  sodium polystyrene (KAYEXALATE) powder Take 15 g by mouth once.    [provider]  tiotropium (SPIRIVA) 18 MCG inhalation capsule Place 18 mcg into inhaler and inhale daily.    [provider]      VITAL SIGNS:  Blood pressure 135/78, pulse (!) 103, temperature 99.2 F (37.3 C), temperature source Oral, resp. rate (!) 21, height 6\' 2"  (1.88 m), weight 95.3 kg (210 lb), SpO2 99 %.  PHYSICAL EXAMINATION:  Physical Exam  GENERAL:  62 y.o.-year-old patient lying in the bed in no acute distress.  EYES: Pupils equal, round, reactive to light and accommodation. No scleral icterus. Extraocular muscles intact.  HEENT: Head atraumatic, normocephalic. Oropharynx and nasopharynx clear. No oropharyngeal erythema, moist oral mucosa  NECK:  Supple, no jugular venous distention. No thyroid enlargement, no tenderness.  LUNGS: Normal  breath sounds bilaterally, no wheezing, rales, some rhonchi b/l. No use of accessory muscles of respiration.  CARDIOVASCULAR: S1, S2 RRR. No murmurs, rubs, gallops, clicks.  ABDOMEN: Soft, nontender, nondistended. Bowel sounds present. No organomegaly or mass.  EXTREMITIES: No pedal edema, cyanosis, or clubbing. + 2 pedal & radial pulses b/l.  Left below the knee amputation. NEUROLOGIC: Cranial nerves II through XII are intact. No focal Motor or sensory deficits appreciated b/l PSYCHIATRIC: The patient is alert and oriented x 3.  SKIN: No obvious rash, lesion, or ulcer.   Right groin PermCath in place.  No Acute infection or drainage noted.  LABORATORY PANEL:   CBC No results for input(s): WBC, HGB, HCT, PLT in the last 168 hours. ------------------------------------------------------------------------------------------------------------------  Chemistries  Recent Labs  Lab 12/21/17 0755  NA 139  K 5.0  CL 101  CO2 27  GLUCOSE 96  BUN 67*  CREATININE 9.41*  CALCIUM 9.1  AST 42*  ALT 35  ALKPHOS 159*  BILITOT 0.6   ------------------------------------------------------------------------------------------------------------------  Cardiac Enzymes Recent Labs  Lab 12/21/17 0755  TROPONINI 0.08*   ------------------------------------------------------------------------------------------------------------------  RADIOLOGY:  Dg Chest Port 1 View  Result Date: 12/21/2017 CLINICAL DATA:  Respiratory distress. EXAM: PORTABLE CHEST 1 VIEW COMPARISON:  CT of the chest on 09/18/2017 FINDINGS: The heart is moderately enlarged. There is dense airspace opacification in the inferior aspect of the right upper lobe versus superior segment of the right lower lobe. Findings likely represent pneumonia. Underlying mass cannot be excluded. There is potentially a small right pleural effusion. Suspect mild interstitial edema. No pneumothorax. IMPRESSION: Right lung dense airspace opacification  most likely representing pneumonia. Underlying mass cannot be excluded. No mass present in this region by CT 3 months ago. Suspect mild interstitial edema and small right pleural effusion. Electronically Signed   By: Aletta Edouard M.D.   On: 12/21/2017 08:08     IMPRESSION AND PLAN:   62 year old male with past medical history of end-stage renal disease on hemodialysis, peripheral vascular disease status post left-sided BKA, hypertension, hyperlipidemia, secondary hyperparathyroidism, GERD, essential hypertension, chronic systolic CHF who presents  to the hospital due to chills, shortness of breath and noted to have a pneumonia.  1.  Sepsis-patient meets criteria given his fever, tachycardia and chest x-ray findings suggestive of pneumonia. -We will treat him with broad-spectrum IV antibiotics with vancomycin, cefepime.  Follow cultures.  Follow hemodynamics.  2.  Pneumonia- suspected to be HCAP -This is based of the chest x-ray findings on admission and also based on his clinical symptoms.  We will treat with IV vancomycin, of the p.m., follow blood, sputum cultures.  3.  COPD with ongoing tobacco abuse-no acute exacerbation.  Continue Dulera, albuterol inhaler as needed, Spiriva.  4.  End-stage renal disease on hemodialysis-patient gets dialysis on Monday Wednesday and Friday. -We will consult nephrology.  5.  Hyperlipidemia-continue atorvastatin.  6.  Secondary hyperparathyroidism-continue PhosLo, Renvela.  7.  Essential hypertension-continue carvedilol, Imdur.  8.  History of chronic systolic CHF-clinically patient is not in congestive heart failure.  Continue dialysis for fluid removal, Imdur, carvedilol.  9.  GERD-continue Protonix.  10.  Hyperkalemia-secondary to his chronic kidney disease/ESRD. -Continue Veltassa.    All the records are reviewed and case discussed with ED provider. Management plans discussed with the patient, family and they are in agreement.  CODE  STATUS: Full code  TOTAL TIME TAKING CARE OF THIS PATIENT: 45 minutes.    Henreitta Leber M.D on 12/21/2017 at 8:54 AM  Between 7am to 6pm - Pager - [phone]  After 6pm go to [email or web address] - password [email or web address] [facility]  [facility]  Office  [phone]  CC: Primary care physician; Donnie Coffin, MD

## 2017-12-21 NOTE — Progress Notes (Signed)
[facility], [place] 12/21/17  Subjective:   Patient known to our practice from previous admissions Last HD was Friday. No problems This morning had chills for about 45 minutes. Came in for evaluation In ER, Fever of 102, tachycardic CXR=rt lung dense air space disease -> pneumonia Admitted for further management  Objective:  Vital signs in last 24 hours:  Temp:  [99.2 F (37.3 C)-102 F (38.9 C)] 99.2 F (37.3 C) (07/28 0841) Pulse Rate:  [103] 103 (07/28 0830) Resp:  [19-28] 21 (07/28 0830) BP: (127-135)/(78-82) 135/78 (07/28 0830) SpO2:  [98 %-99 %] 99 % (07/28 0830) Weight:  [95.3 kg (210 lb)] 95.3 kg (210 lb) (07/28 0839)  Weight change:  Filed Weights   12/21/17 0839  Weight: 95.3 kg (210 lb)    Intake/Output:   No intake or output data in the 24 hours ending 12/21/17 0941   Physical Exam: General: NAD, ill appearing, laying in bed  HEENT Dry oral mucus membranes  Neck supple  Pulm/lungs Decreased breath sounds at bases, [place] O2  CVS/Heart Regular, no rub  Abdomen:  Soft, NT  Extremities: No edema  Neurologic: Alert, oreinted  Skin: Warm, dry  Access: Left femoral permcath       Basic Metabolic Panel:  Recent Labs  Lab 12/21/17 0755  NA 139  K 5.0  CL 101  CO2 27  GLUCOSE 96  BUN 67*  CREATININE 9.41*  CALCIUM 9.1     CBC: No results for input(s): WBC, NEUTROABS, HGB, HCT, MCV, PLT in the last 168 hours.    Lab Results  Component Value Date   HEPBSAG Negative 09/18/2017   HEPBSAB Non Reactive 05/22/2016      Microbiology:  No results found for this or any previous visit (from the past 240 hour(s)).  Coagulation Studies: No results for input(s): LABPROT, INR in the last 72 hours.  Urinalysis: No results for input(s): COLORURINE, LABSPEC, PHURINE, GLUCOSEU, HGBUR, BILIRUBINUR, KETONESUR, PROTEINUR, UROBILINOGEN, NITRITE, LEUKOCYTESUR in the last 72 hours.  Invalid input(s): APPERANCEUR     Imaging: Dg Chest Port 1 View  Result Date: 12/21/2017 CLINICAL DATA:  Respiratory distress. EXAM: PORTABLE CHEST 1 VIEW COMPARISON:  CT of the chest on 09/18/2017 FINDINGS: The heart is moderately enlarged. There is dense airspace opacification in the inferior aspect of the right upper lobe versus superior segment of the right lower lobe. Findings likely represent pneumonia. Underlying mass cannot be excluded. There is potentially a small right pleural effusion. Suspect mild interstitial edema. No pneumothorax. IMPRESSION: Right lung dense airspace opacification most likely representing pneumonia. Underlying mass cannot be excluded. No mass present in this region by CT 3 months ago. Suspect mild interstitial edema and small right pleural effusion. Electronically Signed   By: Aletta Edouard M.D.   On: 12/21/2017 08:08     Medications:   . ceFEPime (MAXIPIME) IV    . vancomycin 1,000 mg (12/21/17 0914)      Assessment/ Plan:  62 y.o. African American male with ESRD on hemodialysis, hypertension, hyperlipidemia, anemia, COPD, peripheral vascular disease, left BKA, right toe amputations, OSA on night O2  MWF [facility].   1. ESRD 2. Pneumonia 3. Anemia of CKD 4. Secondary hyperparathyroidism  plan Dialysis tomorrow May resume binders when able to tolerate normal diet Hgb 13.7. Hold ESA Patient appears dry; 0 uf tomorrow    LOS: 0 Tagan Bartram Candiss Norse 7/28/20199:41 AM  [place], [place]  Note: This note was  prepared with [employer] . Any transcription errors are unintentional

## 2017-12-21 NOTE — ED Notes (Signed)
Holding fluids at this time per Dr. Jacqualine Code

## 2017-12-21 NOTE — Consult Note (Signed)
Pharmacy Antibiotic Note  [name] is a 62 y.o. male admitted on 12/21/2017 with  sepsis.  Pharmacy has been consulted for Cefepime and Vancomycin dosing. Patient received Vancomycin 1g IV x 1 dose.  Patient had PMH of ESRD and receives HD MWF.   Plan: Will give Vancomycin 1000mg  IV x 1 now for a total of Vancomycin 2000mg  LD. Then start Vancomycin 1000mg  IV w/ HD every M-W-F. Vanc level before 3rd HD.  Target Pre-HD level trough 15-25 mcg/mL.  Start Cefepime 1g IV every 24 hours.   Height: 6\' 2"  (188 cm) Weight: 210 lb (95.3 kg) IBW/kg (Calculated) : 82.2  Temp (24hrs), Avg:100.3 F (37.9 C), Min:99.2 F (37.3 C), Max:102 F (38.9 C)  Recent Labs  Lab 12/21/17 0755  WBC 11.5*  CREATININE 9.41*  LATICACIDVEN 1.5    Estimated Creatinine Clearance: 9.3 mL/min (A) (by C-G formula based on SCr of 9.41 mg/dL (H)).    Allergies  Allergen Reactions  . Dust Mite Extract Other (See Comments)    Reaction: unknown    Antimicrobials this admission: 7/28 Vancomycin >>  7/28 Cefepime >>   Microbiology results: 7/28  BCx: pending  7/28  MRSA PCR: pending   Thank you for allowing pharmacy to be a part of this patient's care.  Pernell Dupre, PharmD, BCPS Clinical Pharmacist 12/21/2017 12:20 PM

## 2017-12-21 NOTE — Progress Notes (Signed)
CODE SEPSIS - PHARMACY COMMUNICATION  **Broad Spectrum Antibiotics should be administered within 1 hour of Sepsis diagnosis**  Time Code Sepsis Called/Page Received: 0808  Antibiotics Ordered: vancomycin/cefepime  Time of 1st antibiotic administration:   Additional action taken by pharmacy:   If necessary, Name of Provider/Nurse Contacted:     Napoleon Form ,PharmD Clinical Pharmacist  12/21/2017  8:59 AM

## 2017-12-22 MED ORDER — MUPIROCIN CALCIUM 2 % EX CREA
TOPICAL_CREAM | Freq: Two times a day (BID) | CUTANEOUS | Status: DC
  Administered 2017-12-22 – 2017-12-23 (×3): via TOPICAL
  Filled 2017-12-22: qty 15

## 2017-12-22 MED ORDER — VANCOMYCIN HCL IN DEXTROSE 1-5 GM/200ML-% IV SOLN
1000.0000 mg | Freq: Once | INTRAVENOUS | Status: DC
  Filled 2017-12-22 (×2): qty 200

## 2017-12-22 MED ORDER — CHLORHEXIDINE GLUCONATE CLOTH 2 % EX PADS
6.0000 | MEDICATED_PAD | Freq: Every day | CUTANEOUS | Status: DC
  Administered 2017-12-22 – 2017-12-23 (×2): 6 via TOPICAL

## 2017-12-22 MED ORDER — MUPIROCIN 2 % EX OINT
1.0000 "application " | TOPICAL_OINTMENT | Freq: Two times a day (BID) | CUTANEOUS | Status: DC
  Filled 2017-12-22 (×2): qty 22

## 2017-12-22 NOTE — Progress Notes (Signed)
HD Tx started    12/22/17 1430  Vital Signs  Pulse Rate 71  Pulse Rate Source Monitor  Resp 17  BP (!) 151/75  BP Location Right Arm  BP Method Automatic  Patient Position (if appropriate) Lying  Oxygen Therapy  SpO2 96 %  O2 Device Nasal Cannula  O2 Flow Rate (L/min) 2 L/min  Pulse Oximetry Type Continuous  During Hemodialysis Assessment  Blood Flow Rate (mL/min) 400 mL/min  Arterial Pressure (mmHg) -250 mmHg  Venous Pressure (mmHg) 220 mmHg  Transmembrane Pressure (mmHg) 80 mmHg  Ultrafiltration Rate (mL/min) 1250 mL/min  Dialysate Flow Rate (mL/min) 800 ml/min  Conductivity: Machine  14.1  HD Safety Checks Performed Yes  Dialysis Fluid Bolus Normal Saline  Bolus Amount (mL) 250 mL  Intra-Hemodialysis Comments Tx initiated

## 2017-12-22 NOTE — Progress Notes (Signed)
[place], [place] 12/22/17  Subjective:  Patient being treated for pneumonia. Feeling a bit better today. Due for hemodialysis today. Blood cultures thus far negative.  Objective:  Vital signs in last 24 hours:  Temp:  [98 F (36.7 C)-98.1 F (36.7 C)] 98 F (36.7 C) (07/29 0509) Pulse Rate:  [63-67] 63 (07/29 0509) Resp:  [18-20] 18 (07/29 0509) BP: (111-119)/(48-56) 111/48 (07/29 0509) SpO2:  [96 %-99 %] 99 % (07/29 0509) FiO2 (%):  [36 %] 36 % (07/28 1751) Weight:  [94 kg (207 lb 3.7 oz)] 94 kg (207 lb 3.7 oz) (07/28 1508)  Weight change:  Filed Weights   12/21/17 0839 12/21/17 1508  Weight: 95.3 kg (210 lb) 94 kg (207 lb 3.7 oz)    Intake/Output:    Intake/Output Summary (Last 24 hours) at 12/22/2017 1159 Last data filed at 12/22/2017 0509 Gross per 24 hour  Intake 200 ml  Output 0 ml  Net 200 ml     Physical Exam: General: NAD  HEENT Moist oral mucus membranes  Neck supple  Pulm/lungs Decreased breath sounds at bases  CVS/Heart Regular, no rub  Abdomen:  Soft, NTND, BS present  Extremities: No edema, left BKA  Neurologic: Alert, oriented  Skin: Warm, dry  Access: Right femoral permcath       Basic Metabolic Panel:  Recent Labs  Lab 12/21/17 0755  NA 139  K 5.0  CL 101  CO2 27  GLUCOSE 96  BUN 67*  CREATININE 9.41*  CALCIUM 9.1     CBC: Recent Labs  Lab 12/21/17 0755  WBC 11.5*  NEUTROABS 8.1*  HGB 13.7  HCT 42.3  MCV 93.9  PLT 169      Lab Results  Component Value Date   HEPBSAG Negative 09/18/2017   HEPBSAB Non Reactive 05/22/2016      Microbiology:  Recent Results (from the past 240 hour(s))  Blood Culture (routine x 2)     Status: None (Preliminary result)   Collection Time: 12/21/17  7:55 AM  Result Value Ref Range Status   Specimen Description BLOOD BLOOD RIGHT WRIST  Final   Special Requests   Final    BOTTLES DRAWN AEROBIC AND ANAEROBIC Blood Culture results may not be optimal  due to an excessive volume of blood received in culture bottles   Culture   Final    NO GROWTH < 24 HOURS Performed at [facility], [address], [place], [place] [address]    Report Status PENDING  Incomplete  Blood Culture (routine x 2)     Status: None (Preliminary result)   Collection Time: 12/21/17  7:57 AM  Result Value Ref Range Status   Specimen Description BLOOD BLOOD RIGHT WRIST  Final   Special Requests   Final    BOTTLES DRAWN AEROBIC AND ANAEROBIC Blood Culture adequate volume   Culture   Final    NO GROWTH < 24 HOURS Performed at [facility], [address] [place], [place] [address]    Report Status PENDING  Incomplete  MRSA PCR Screening     Status: Abnormal   Collection Time: 12/21/17 12:11 PM  Result Value Ref Range Status   MRSA by PCR POSITIVE (A) NEGATIVE Final    Comment:        The GeneXpert MRSA Assay (FDA approved for NASAL specimens only), is one component of a comprehensive MRSA colonization surveillance program. It is not intended to diagnose MRSA infection nor to guide or monitor  treatment for MRSA infections. RESULT CALLED TO, READ BACK BY AND VERIFIED WITH: PHYLISS BRIGGS AT 1410 ON 12/21/17 BY SNJ Performed at [facility], [address] [place], [place] [address]     Coagulation Studies: No results for input(s): LABPROT, INR in the last 72 hours.  Urinalysis: No results for input(s): COLORURINE, LABSPEC, PHURINE, GLUCOSEU, HGBUR, BILIRUBINUR, KETONESUR, PROTEINUR, UROBILINOGEN, NITRITE, LEUKOCYTESUR in the last 72 hours.  Invalid input(s): APPERANCEUR    Imaging: Dg Chest Port 1 View  Result Date: 12/21/2017 CLINICAL DATA:  Respiratory distress. EXAM: PORTABLE CHEST 1 VIEW COMPARISON:  CT of the chest on 09/18/2017 FINDINGS: The heart is moderately enlarged. There is dense airspace opacification in the inferior aspect of the right upper lobe versus superior segment of the right lower lobe. Findings  likely represent pneumonia. Underlying mass cannot be excluded. There is potentially a small right pleural effusion. Suspect mild interstitial edema. No pneumothorax. IMPRESSION: Right lung dense airspace opacification most likely representing pneumonia. Underlying mass cannot be excluded. No mass present in this region by CT 3 months ago. Suspect mild interstitial edema and small right pleural effusion. Electronically Signed   By: Aletta Edouard M.D.   On: 12/21/2017 08:08     Medications:   . ceFEPime (MAXIPIME) IV    . vancomycin     . apixaban  5 mg Oral BID  . atorvastatin  10 mg Oral QHS  . calcium acetate  667 mg Oral Daily  . carvedilol  6.25 mg Oral BID  . Chlorhexidine Gluconate Cloth  6 each Topical Q0600  . Chlorhexidine Gluconate Cloth  6 each Topical Q0600  . fluticasone  1 spray Each Nare Daily  . isosorbide mononitrate  30 mg Oral Daily  . loratadine  10 mg Oral Daily  . midodrine  5 mg Oral BID  . mometasone-formoterol  2 puff Inhalation BID  . mupirocin ointment  1 application Nasal BID  . pantoprazole  40 mg Oral Daily  . patiromer  8.4 g Oral Daily  . pregabalin  75 mg Oral Daily  . sevelamer carbonate  800 mg Oral TID WC  . tiotropium  18 mcg Inhalation Daily     Assessment/ Plan:  62 y.o. African American male with ESRD on hemodialysis, hypertension, hyperlipidemia, anemia, COPD, peripheral vascular disease, left BKA, right toe amputations, OSA on night O2  MWF [facility]   1. ESRD 2. Bacterial Pneumonia 3. Anemia of CKD 4. Secondary hyperparathyroidism  plan  Patient seen at bedside.  He is due for hemodialysis today and orders have been prepared.  Potassium currently 5.0 and acceptable.  Maintain the patient on Veltassa 8.4 g p.o. daily.  We will also maintain the patient on binder therapy with Renvela 800 mg p.o. 3 times daily.    LOS: 1 Zeffie Bickert 7/29/201911:59 AM  [place],  [place]  Note: This note was prepared with Dragon dictation. Any transcription errors are unintentional

## 2017-12-22 NOTE — Progress Notes (Signed)
Post HD Tx    12/22/17 1805  Hand-Off documentation  Report given to (Full Name) Vesta Mixer, RN   Report received from (Full Name) Beatris Ship, RN   Vital Signs  Temp 98.1 F (36.7 C)  Temp Source Oral  Pulse Rate 72  Pulse Rate Source Monitor  Resp 13  BP 124/70  BP Location Right Arm  BP Method Automatic  Patient Position (if appropriate) Lying  Oxygen Therapy  SpO2 99 %  O2 Device Nasal Cannula  O2 Flow Rate (L/min) 2 L/min  Pulse Oximetry Type Continuous  Pain Assessment  Pain Scale 0-10  Pain Score 0  Dialysis Weight  Weight 92.9 kg (204 lb 12.9 oz)  Type of Weight Post-Dialysis  Post-Hemodialysis Assessment  Rinseback Volume (mL) 250 mL  KECN 64.2 V  Dialyzer Clearance Lightly streaked  Duration of HD Treatment -hour(s) 3.5 hour(s)  Hemodialysis Intake (mL) 700 mL  UF Total -Machine (mL) 3752 mL  Net UF (mL) 3052 mL  Tolerated HD Treatment Yes  Hemodialysis Catheter Right Femoral vein  Placement Date/Time: 07/25/17 1301   Placed prior to admission: Yes  Time Out: Correct patient;Correct site;Correct procedure  Maximum sterile barrier precautions: Hand hygiene;Cap;Mask;Sterile gown;Sterile gloves;Large sterile sheet  Site Prep: Chlor...  Site Condition No complications  Blue Lumen Status Heparin locked  Red Lumen Status Heparin locked  Post treatment catheter status Capped and Clamped

## 2017-12-22 NOTE — Progress Notes (Signed)
[name] at [place] NAME: [name]    MR#:  [id number]  DATE OF BIRTH:  April 21, 1956  SUBJECTIVE:   Patient presented to the hospital due to shortness of breath and noted to have sepsis due to pneumonia.  Feels a bit better today.  Plan for dialysis today.  Afebrile, hemodynamically stable.  REVIEW OF SYSTEMS:    Review of Systems  Constitutional: Negative for chills and fever.  HENT: Negative for congestion and tinnitus.   Eyes: Negative for blurred vision and double vision.  Respiratory: Positive for shortness of breath. Negative for cough and wheezing.   Cardiovascular: Negative for chest pain, orthopnea and PND.  Gastrointestinal: Negative for abdominal pain, diarrhea, nausea and vomiting.  Genitourinary: Negative for dysuria and hematuria.  Neurological: Negative for dizziness, sensory change and focal weakness.  All other systems reviewed and are negative.   Nutrition: Renal with fluid restriction Tolerating Diet: Yes Tolerating PT: Await Eval.   DRUG ALLERGIES:   Allergies  Allergen Reactions  . Dust Mite Extract Other (See Comments)    Reaction: unknown    VITALS:  Blood pressure (!) 111/48, pulse 63, temperature 98 F (36.7 C), temperature source Oral, resp. rate 18, height _0  (1.88 m), weight 94 kg (207 lb 3.7 oz), SpO2 99 %.  PHYSICAL EXAMINATION:   Physical Exam  GENERAL:  62 y.o.-year-old patient lying in bed in no acute distress.  EYES: Pupils equal, round, reactive to light and accommodation. No scleral icterus. Extraocular muscles intact.  HEENT: Head atraumatic, normocephalic. Oropharynx and nasopharynx clear.  NECK:  Supple, no jugular venous distention. No thyroid enlargement, no tenderness.  LUNGS: Normal breath sounds bilaterally, no wheezing, rales, rhonchi. No use of accessory muscles of respiration.  CARDIOVASCULAR: S1, S2 normal. No murmurs, rubs, or gallops.  ABDOMEN: Soft, nontender,  nondistended. Bowel sounds present. No organomegaly or mass.  EXTREMITIES: No cyanosis, clubbing or edema b/l.    NEUROLOGIC: Cranial nerves II through XII are intact. No focal Motor or sensory deficits b/l.   PSYCHIATRIC: The patient is alert and oriented x 3.  SKIN: No obvious rash, lesion, or ulcer.   Right groin perm-cath in place.     LABORATORY PANEL:   CBC Recent Labs  Lab 12/21/17 0755  WBC 11.5*  HGB 13.7  HCT 42.3  PLT 169   ------------------------------------------------------------------------------------------------------------------  Chemistries  Recent Labs  Lab 12/21/17 0755  NA 139  K 5.0  CL 101  CO2 27  GLUCOSE 96  BUN 67*  CREATININE 9.41*  CALCIUM 9.1  AST 42*  ALT 35  ALKPHOS 159*  BILITOT 0.6   ------------------------------------------------------------------------------------------------------------------  Cardiac Enzymes Recent Labs  Lab 12/21/17 0755  TROPONINI 0.08*   ------------------------------------------------------------------------------------------------------------------  RADIOLOGY:  Dg Chest Port 1 View  Result Date: 12/21/2017 CLINICAL DATA:  Respiratory distress. EXAM: PORTABLE CHEST 1 VIEW COMPARISON:  CT of the chest on 09/18/2017 FINDINGS: The heart is moderately enlarged. There is dense airspace opacification in the inferior aspect of the right upper lobe versus superior segment of the right lower lobe. Findings likely represent pneumonia. Underlying mass cannot be excluded. There is potentially a small right pleural effusion. Suspect mild interstitial edema. No pneumothorax. IMPRESSION: Right lung dense airspace opacification most likely representing pneumonia. Underlying mass cannot be excluded. No mass present in this region by CT 3 months ago. Suspect mild interstitial edema and small right pleural effusion. Electronically Signed   By: Aletta Edouard M.D.   On: 12/21/2017  08:08     ASSESSMENT AND PLAN:    62 year old male with past medical history of end-stage renal disease on hemodialysis, peripheral vascular disease status post left-sided BKA, hypertension, hyperlipidemia, secondary hyperparathyroidism, GERD, essential hypertension, chronic systolic CHF who presents to the hospital due to chills, shortness of breath and noted to have a pneumonia.  1.  Sepsis-patient met criteria on admission given his fever, tachycardia and chest x-ray findings suggestive of pneumonia. -Continue IV Vancomycin, cefepime.  Cultures so far negative, currently afebrile.  2.  Pneumonia- suspected to be HCAP -This was based of the chest x-ray findings on admission and also based on his clinical symptoms. -Continue IV vancomycin, cefepime.  Cultures so far negative.  Feels better today.  3.  COPD with ongoing tobacco abuse-no acute exacerbation.  Continue Dulera, albuterol inhaler as needed, Spiriva.  4.  End-stage renal disease on hemodialysis-patient gets dialysis on Monday Wednesday and Friday. -Nephrology consulted and patient to get hemodialysis today.  5.  Hyperlipidemia-continue atorvastatin.  6.  Secondary hyperparathyroidism-continue PhosLo, Renvela.  7.  Essential hypertension-continue carvedilol, Imdur.  8.  History of chronic systolic CHF-clinically patient is not in congestive heart failure.  Continue dialysis for fluid removal, Imdur, carvedilol.  9.  GERD-continue Protonix.  10.  Hyperkalemia-secondary to his chronic kidney disease/ESRD. -Continue Veltassa.  Possible d/c home tomorrow.   All the records are reviewed and case discussed with Care Management/Social Worker. Management plans discussed with the patient, family and they are in agreement.  CODE STATUS: Full code  DVT Prophylaxis: Eliquis  TOTAL TIME TAKING CARE OF THIS PATIENT: 30 minutes.   POSSIBLE D/C IN 1-2 DAYS, DEPENDING ON CLINICAL CONDITION.   Henreitta Leber M.D on 12/22/2017 at 2:25 PM  Between 7am to  6pm - Pager - [phone]  After 6pm go to [email or web address] - Technical brewer [facility]  Office  [phone]  CC: Primary care physician; Donnie Coffin, MD

## 2017-12-22 NOTE — Clinical Social Work Note (Signed)
Clinical Social Work Assessment  Patient Details  Name: [name] MRN: [id number] Date of Birth: 05-21-1956  Date of referral:  12/22/17               Reason for consult:  Discharge Planning                Permission sought to share information with:  Facility Art therapist granted to share information::  Yes, Verbal Permission Granted  Name::        Agency::     Relationship::     Contact Information:     Housing/Transportation Living arrangements for the past 2 months:  [place] of Information:  Patient Patient Interpreter Needed:  None Criminal Activity/Legal Involvement Pertinent to Current Situation/Hospitalization:  No - Comment as needed Significant Relationships:  Siblings Lives with:  Facility Resident Do you feel safe going back to the place where you live?  Yes Need for family participation in patient care:  No (Coment)  Care giving concerns:  Patient is a long term resident at [facility] assisted living.   Social Worker assessment / plan:  CSW has spoken with patient this morning and he states he has been at [facility] for about 9 months. He states he really likes it there and wishes to return when time. He states he gets around by wheelchair but is getting practice with his prosthetic with PT. Patient states has had familial support in the area.  Employment status:  Disabled (Comment on whether or not currently receiving Disability) Insurance information:  Medicare PT Recommendations:    Information / Referral to community resources:     Patient/Family's Response to care:  Patient expressed gratitude for CSW visit.  Patient/Family's Understanding of and Emotional Response to Diagnosis, Current Treatment, and Prognosis:  Patient engaged in his treatment plan and is looking forward to feeling better.  Emotional Assessment Appearance:  Appears older than stated age Attitude/Demeanor/Rapport:  (pleasant and  cooperative) Affect (typically observed):  Calm, Pleasant Orientation:  Oriented to Self, Oriented to Place, Oriented to  Time, Oriented to Situation Alcohol / Substance use:  Not Applicable Psych involvement (Current and /or in the community):  No (Comment)  Discharge Needs  Concerns to be addressed:  Care Coordination Readmission within the last 30 days:  No Current discharge risk:  None Barriers to Discharge:  No Barriers Identified   Shela Leff, LCSW 12/22/2017, 11:16 AM

## 2017-12-22 NOTE — Progress Notes (Signed)
Pre HD assessment    12/22/17 1425  Neurological  Level of Consciousness Alert  Orientation Level Oriented X4  Respiratory  Respiratory Pattern Dyspnea with exertion;Regular  Chest Assessment Chest expansion symmetrical  Bilateral Breath Sounds Diminished  Cough None  Cardiac  Pulse Regular  Heart Sounds S1, S2  ECG Monitor Yes  Vascular  R Radial Pulse +2  L Radial Pulse +2  Edema Generalized  Generalized Edema +2  Psychosocial  Psychosocial (WDL) WDL

## 2017-12-22 NOTE — Progress Notes (Signed)
Post HD assessment    12/22/17 1815  Neurological  Level of Consciousness Alert  Orientation Level Oriented X4  Respiratory  Respiratory Pattern Regular  Chest Assessment Chest expansion symmetrical  Bilateral Breath Sounds Diminished;Clear  Cough None  Cardiac  Pulse Regular  Heart Sounds S1, S2  ECG Monitor Yes  Vascular  R Radial Pulse +2  L Radial Pulse +2  Edema Generalized  Generalized Edema None  Psychosocial  Psychosocial (WDL) WDL

## 2017-12-22 NOTE — Progress Notes (Signed)
Pre HD Tx   12/22/17 1425  Hand-Off documentation  Report given to (Full Name) Beatris Ship, RN   Report received from (Full Name) Vesta Mixer, RN   Vital Signs  Temp 98.1 F (36.7 C)  Temp Source Oral  Pulse Rate 72  Pulse Rate Source Monitor  Resp 18  BP (!) 153/71  BP Location Right Arm  BP Method Automatic  Patient Position (if appropriate) Lying  Oxygen Therapy  SpO2 99 %  O2 Device Nasal Cannula  O2 Flow Rate (L/min) 2 L/min  Pulse Oximetry Type Continuous  Pain Assessment  Pain Scale 0-10  Pain Score 0  Dialysis Weight  Weight 95.7 kg (210 lb 15.7 oz)  Type of Weight Pre-Dialysis  Time-Out for Hemodialysis  What Procedure? HD  Pt Identifiers(min of two) First/Last Name;MRN/Account#  Correct Site? Yes  Correct Side? Yes  Correct Procedure? Yes  Consents Verified? Yes  Rad Studies Available? N/A  Safety Precautions Reviewed? Yes  [employer] Number [id number]  Station Number 1  UF/Alarm Test Passed  Conductivity: Meter 14  Conductivity: Machine  14.1  pH 7.2  Reverse Osmosis Main  Normal Saline Lot Number [id number]  Dialyzer Lot Number [id number]  Disposable Set Lot Number [id number]  Machine Temperature 98.6 F (37 C)  Musician and Audible Yes  Blood Lines Intact and Secured Yes  Pre Treatment Patient Checks  Vascular access used during treatment Catheter  Hepatitis B Surface Antigen Results Negative  Date Hepatitis B Surface Antigen Drawn 09/18/17  Hepatitis B Surface Antibody  (>10)  Date Hepatitis B Surface Antibody Drawn 11/17/17  Hemodialysis Consent Verified Yes  Hemodialysis Standing Orders Initiated Yes  Prime Ordered Normal Saline  Length of  DialysisTreatment -hour(s) 3.5 Hour(s)  Dialysis Treatment Comments Na 140  Dialyzer Elisio 17H NR  Dialysate 2K, 2.5 Ca  Dialysis Anticoagulant None  Dialysate Flow Ordered 800  Blood Flow Rate Ordered 400 mL/min  Ultrafiltration Goal 2.5 Liters  Pre Treatment Labs Phosphorus   Dialysis Blood Pressure Support Ordered Normal Saline  Education / Care Plan  Dialysis Education Provided Yes  Documented Education in Care Plan Yes  Hemodialysis Catheter Right Femoral vein  Placement Date/Time: 07/25/17 1301   Placed prior to admission: Yes  Time Out: Correct patient;Correct site;Correct procedure  Maximum sterile barrier precautions: Hand hygiene;Cap;Mask;Sterile gown;Sterile gloves;Large sterile sheet  Site Prep: Chlor...  Site Condition No complications  Blue Lumen Status Flushed  Red Lumen Status Flushed  Purple Lumen Status N/A  Catheter fill volume (Arterial) 3.1 cc  Catheter fill volume (Venous) 3.1  Dressing Type Gauze/Drain sponge  Dressing Status Dry;Intact  Interventions Dressing changed  Drainage Description None  Dressing Change Due 12/28/17

## 2017-12-22 NOTE — Progress Notes (Signed)
HD Tx ended    12/22/17 1800  Vital Signs  Pulse Rate (!) 104  Pulse Rate Source Monitor  Resp 16  BP (!) 116/56  BP Location Right Arm  BP Method Automatic  Patient Position (if appropriate) Lying  Oxygen Therapy  SpO2 100 %  O2 Device Nasal Cannula  O2 Flow Rate (L/min) 2 L/min  Pulse Oximetry Type Continuous  During Hemodialysis Assessment  HD Safety Checks Performed Yes  KECN 64.2 KECN  Dialysis Fluid Bolus Normal Saline  Bolus Amount (mL) 250 mL  Intra-Hemodialysis Comments Tx completed;Tolerated well

## 2017-12-23 LAB — CBC
HCT: 36.2 % — ABNORMAL LOW (ref 40.0–52.0)
Hemoglobin: 12 g/dL — ABNORMAL LOW (ref 13.0–18.0)
MCH: 30.9 pg (ref 26.0–34.0)
MCHC: 33.3 g/dL (ref 32.0–36.0)
MCV: 92.8 fL (ref 80.0–100.0)
Platelets: 133 10*3/uL — ABNORMAL LOW (ref 150–440)
RBC: 3.9 MIL/uL — ABNORMAL LOW (ref 4.40–5.90)
RDW: 15.5 % — ABNORMAL HIGH (ref 11.5–14.5)
WBC: 8.5 10*3/uL (ref 3.8–10.6)

## 2017-12-23 LAB — BASIC METABOLIC PANEL
Anion gap: 12 (ref 5–15)
BUN: 69 mg/dL — ABNORMAL HIGH (ref 8–23)
CALCIUM: 8.9 mg/dL (ref 8.9–10.3)
CHLORIDE: 105 mmol/L (ref 98–111)
CO2: 23 mmol/L (ref 22–32)
CREATININE: 10.04 mg/dL — AB (ref 0.61–1.24)
GFR calc non Af Amer: 5 mL/min — ABNORMAL LOW (ref >60)
GFR, EST AFRICAN AMERICAN: 6 mL/min — AB (ref >60)
Glucose, Bld: 118 mg/dL — ABNORMAL HIGH (ref 70–99)
Potassium: 4.7 mmol/L (ref 3.5–5.1)
SODIUM: 140 mmol/L (ref 135–145)

## 2017-12-23 MED ORDER — AMOXICILLIN-POT CLAVULANATE 500-125 MG PO TABS: 1.0000 | ORAL_TABLET | Freq: Every day | ORAL | 0 refills | Status: AC

## 2017-12-23 NOTE — Progress Notes (Signed)
[place], [place] 12/23/17  Subjective:  Patient seen at bedside. Completed hemodialysis yesterday. Overall feeling better.   Objective:  Vital signs in last 24 hours:  Temp:  [97.7 F (36.5 C)-98.1 F (36.7 C)] 97.8 F (36.6 C) (07/30 1241) Pulse Rate:  [64-104] 72 (07/30 1241) Resp:  [12-20] 16 (07/30 1241) BP: (106-156)/(56-124) 121/98 (07/30 1241) SpO2:  [85 %-100 %] 99 % (07/30 1241) Weight:  [92.9 kg (204 lb 12.9 oz)-95.7 kg (210 lb 15.7 oz)] 92.9 kg (204 lb 12.9 oz) (07/29 1805)  Weight change: 0.445 kg (15.7 oz) Filed Weights   12/21/17 1508 12/22/17 1425 12/22/17 1805  Weight: 94 kg (207 lb 3.7 oz) 95.7 kg (210 lb 15.7 oz) 92.9 kg (204 lb 12.9 oz)    Intake/Output:    Intake/Output Summary (Last 24 hours) at 12/23/2017 1341 Last data filed at 12/22/2017 1805 Gross per 24 hour  Intake -  Output 3052 ml  Net -3052 ml     Physical Exam: General: NAD  HEENT Moist oral mucus membranes  Neck supple  Pulm/lungs Decreased breath sounds at bases  CVS/Heart Regular, no rub  Abdomen:  Soft, NTND, BS present  Extremities: No edema, left BKA  Neurologic: Alert, oriented  Skin: Warm, dry  Access: Right femoral permcath       Basic Metabolic Panel:  Recent Labs  Lab 12/21/17 0755 12/23/17 1011  NA 139 140  K 5.0 4.7  CL 101 105  CO2 27 23  GLUCOSE 96 118*  BUN 67* 69*  CREATININE 9.41* 10.04*  CALCIUM 9.1 8.9     CBC: Recent Labs  Lab 12/21/17 0755 12/23/17 1011  WBC 11.5* 8.5  NEUTROABS 8.1*  --   HGB 13.7 12.0*  HCT 42.3 36.2*  MCV 93.9 92.8  PLT 169 133*      Lab Results  Component Value Date   HEPBSAG Negative 09/18/2017   HEPBSAB Non Reactive 05/22/2016      Microbiology:  Recent Results (from the past 240 hour(s))  Blood Culture (routine x 2)     Status: None (Preliminary result)   Collection Time: 12/21/17  7:55 AM  Result Value Ref Range Status   Specimen Description BLOOD BLOOD RIGHT  WRIST  Final   Special Requests   Final    BOTTLES DRAWN AEROBIC AND ANAEROBIC Blood Culture results may not be optimal due to an excessive volume of blood received in culture bottles   Culture   Final    NO GROWTH 2 DAYS Performed at [facility], [address] [place], [place] [address]    Report Status PENDING  Incomplete  Blood Culture (routine x 2)     Status: None (Preliminary result)   Collection Time: 12/21/17  7:57 AM  Result Value Ref Range Status   Specimen Description BLOOD BLOOD RIGHT WRIST  Final   Special Requests   Final    BOTTLES DRAWN AEROBIC AND ANAEROBIC Blood Culture adequate volume   Culture   Final    NO GROWTH 2 DAYS Performed at [facility], [address] [place],  [address]    Report Status PENDING  Incomplete  MRSA PCR Screening     Status: Abnormal   Collection Time: 12/21/17 12:11 PM  Result Value Ref Range Status   MRSA by PCR POSITIVE (A) NEGATIVE Final    Comment:        The GeneXpert MRSA Assay (FDA approved for NASAL specimens only), is one component of a  comprehensive MRSA colonization surveillance program. It is not intended to diagnose MRSA infection nor to guide or monitor treatment for MRSA infections. RESULT CALLED TO, READ BACK BY AND VERIFIED WITH: PHYLISS BRIGGS AT 1410 ON 12/21/17 BY SNJ Performed at [facility], [address] [place], [place] [address]     Coagulation Studies: No results for input(s): LABPROT, INR in the last 72 hours.  Urinalysis: No results for input(s): COLORURINE, LABSPEC, PHURINE, GLUCOSEU, HGBUR, BILIRUBINUR, KETONESUR, PROTEINUR, UROBILINOGEN, NITRITE, LEUKOCYTESUR in the last 72 hours.  Invalid input(s): APPERANCEUR    Imaging: No results found.   Medications:   . ceFEPime (MAXIPIME) IV Stopped (12/22/17 1312)  . vancomycin Stopped (12/22/17 1804)  . vancomycin Stopped (12/22/17 1705)   . apixaban  5 mg Oral BID  . atorvastatin  10 mg Oral QHS   . calcium acetate  667 mg Oral Daily  . carvedilol  6.25 mg Oral BID  . Chlorhexidine Gluconate Cloth  6 each Topical Q0600  . fluticasone  1 spray Each Nare Daily  . isosorbide mononitrate  30 mg Oral Daily  . loratadine  10 mg Oral Daily  . midodrine  5 mg Oral BID  . mometasone-formoterol  2 puff Inhalation BID  . mupirocin cream   Topical BID  . pantoprazole  40 mg Oral Daily  . patiromer  8.4 g Oral Daily  . pregabalin  75 mg Oral Daily  . sevelamer carbonate  800 mg Oral TID WC  . tiotropium  18 mcg Inhalation Daily     Assessment/ Plan:  62 y.o. African American male with ESRD on hemodialysis, hypertension, hyperlipidemia, anemia, COPD, peripheral vascular disease, left BKA, right toe amputations, OSA on night O2  MWF [name]   1. ESRD on HD MWF 2. Bacterial Pneumonia 3. Anemia of CKD 4. Secondary hyperparathyroidism  Plan: The patient completed hemodialysis yesterday.  No urgent indication for dialysis today.  He is being treated for pneumonia with vancomycin and cefepime.  Outpatient antibiotic management as per hospitalist.  If he is discharged today we recommended to the patient that he keep his regularly scheduled dialysis appointment.  He verbalized understanding of this.  Hemoglobin currently 12.0 and acceptable.  We also recommend continued monitoring of bone mineral metabolism parameters as an outpatient.  He will be maintained on Renvela.    LOS: 2 [name] 7/30/20191:41 PM  [place], [place]  Note: This note was prepared with Dragon dictation. Any transcription errors are unintentional

## 2017-12-23 NOTE — Discharge Summary (Signed)
[place] [facility] [place] NAME: [name]    MR#:  [id number]  DATE OF BIRTH:  1955/11/30  DATE OF ADMISSION:  12/21/2017 ADMITTING PHYSICIAN: Henreitta Leber, MD  DATE OF DISCHARGE: 12/23/2017  PRIMARY CARE PHYSICIAN: Donnie Coffin, MD    ADMISSION DIAGNOSIS:  COPD exacerbation ([place]) [J44.1] HCAP (healthcare-associated pneumonia) [J18.9] Sepsis, due to unspecified organism ([place]) [A41.9]  DISCHARGE DIAGNOSIS:  Active Problems:   Acute on chronic respiratory failure with hypoxia ([place])   SECONDARY DIAGNOSIS:   Past Medical History:  Diagnosis Date  . 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 Amputee, below knee, left ([place])   . Anemia   . Asthma   . Cardiomyopathy ([place])   . CHF (congestive heart failure) ([place])   . Chronic systolic heart failure ([place])   . Complication of anesthesia    hypotension  . COPD (chronic obstructive pulmonary disease) ([place])   . Coronary artery disease   . Dialysis patient ([place])    Mon, Wed, Fri  . End stage renal disease ([place])   . GERD (gastroesophageal reflux disease)   . Headache   . History of kidney stones   . History of pulmonary embolism   . HLD (hyperlipidemia)   . HTN (hypertension)   . Hyperparathyroidism   . Myocardial infarction ([place])   . Peripheral vascular disease ([place])   . Shortness of breath dyspnea   . Sleep apnea    NO C-PAP, Patient stated in process of  "getting one"   . Tobacco dependence     HOSPITAL COURSE:   62 year old male with past medical history of end-stage renal disease on hemodialysis, peripheral vascular disease status post left-sided BKA, hypertension, hyperlipidemia, secondary hyperparathyroidism, GERD, essential hypertension, chronic systolic CHF who presents to the hospital due to chills, shortness of breath and noted to have a pneumonia.  1. Sepsis-patient met criteria on admission given his fever, tachycardia and chest x-ray findings suggestive of  pneumonia. Patient was treated with IV vancomycin and cefepime while in the hospital.  He has improved, currently afebrile and hemodynamically stable.  Patient cultures are negative.  He is now being discharged on oral Augmentin for additional 7 days.  2. Pneumonia-  patient was treated with IV vancomycin, cefepime while in the hospital.  He has improved.  His blood cultures are negative, he is afebrile.  He is being discharged on oral Augmentin for additional 7 days.  3. COPD with ongoing tobacco abuse-no acute exacerbation in the hospital.  Patient will continue his Symbicort, Spiriva, albuterol inhaler as needed.  4. End-stage renal disease on hemodialysis- he was maintained on his dialysis schedule Monday Wednesday Friday and he will resume that upon discharge.  5. Hyperlipidemia- pt. Will continue atorvastatin.  6. Secondary hyperparathyroidism- pt. Will continue PhosLo, Renvela.  7. Essential hypertension-pt. Will continue carvedilol, Imdur.  8. History of chronic systolic CHF- clinically pt. Was not in CHF while in the hospital.   - Patient will continue Imdur, carvedilol.    9. GERD- pt. Will continue Protonix.  10. Hyperkalemia-secondary to his chronic kidney disease/ESRD. - pt. Will Continue Veltassa.    DISCHARGE CONDITIONS:   Stable.   CONSULTS OBTAINED:    DRUG ALLERGIES:   Allergies  Allergen Reactions  . Dust Mite Extract Other (See Comments)    Reaction: unknown    DISCHARGE MEDICATIONS:   Allergies as of 12/23/2017      Reactions   Dust Mite Extract  Other (See Comments)   Reaction: unknown      Medication List    STOP taking these medications   sodium polystyrene powder Commonly known as:  KAYEXALATE     TAKE these medications   acetaminophen 500 MG tablet Commonly known as:  TYLENOL Take 500 mg by mouth every 6 (six) hours as needed for mild pain.   albuterol 108 (90 Base) MCG/ACT inhaler Commonly known as:  PROVENTIL  HFA;VENTOLIN HFA Inhale 2 puffs into the lungs every 6 (six) hours as needed for wheezing or shortness of breath.   amoxicillin-clavulanate 500-125 MG tablet Commonly known as:  AUGMENTIN Take 1 tablet (500 mg total) by mouth daily for 7 days.   apixaban 5 MG Tabs tablet Commonly known as:  ELIQUIS Take 1 tablet (5 mg total) by mouth 2 (two) times daily.   aspirin EC 325 MG tablet Take 325 mg by mouth daily.   atorvastatin 10 MG tablet Commonly known as:  LIPITOR Take 10 mg by mouth at bedtime.   budesonide-formoterol 160-4.5 MCG/ACT inhaler Commonly known as:  SYMBICORT Inhale 2 puffs into the lungs 2 (two) times daily.   calcium acetate 667 MG capsule Commonly known as:  PHOSLO Take 667 mg by mouth daily.   carvedilol 6.25 MG tablet Commonly known as:  COREG Take 1 tablet (6.25 mg total) by mouth 2 (two) times daily.   cetirizine 10 MG tablet Commonly known as:  ZYRTEC Take 10 mg by mouth daily.   COMBIVENT RESPIMAT 20-100 MCG/ACT Aers respimat Generic drug:  Ipratropium-Albuterol Inhale 1 puff into the lungs every 6 (six) hours as needed for wheezing or shortness of breath.   docusate sodium 100 MG capsule Commonly known as:  COLACE Take 100 mg by mouth daily as needed for mild constipation or moderate constipation.   erythromycin ophthalmic ointment Place 1 application into the right eye at bedtime.   fluticasone 50 MCG/ACT nasal spray Commonly known as:  FLONASE Place 1 spray into both nostrils daily.   hydrocortisone 2.5 % cream Apply 1 application topically 2 (two) times daily as needed (itching).   isosorbide mononitrate 30 MG 24 hr tablet Commonly known as:  IMDUR Take 30 mg by mouth daily.   midodrine 10 MG tablet Commonly known as:  PROAMATINE Take 5 mg by mouth 2 (two) times daily.   nitroGLYCERIN 0.4 MG SL tablet Commonly known as:  NITROSTAT Place 0.4 mg under the tongue every 5 (five) minutes as needed for chest pain.   pantoprazole 20 MG  tablet Commonly known as:  PROTONIX Take 20 mg by mouth daily.   patiromer 8.4 g packet Commonly known as:  VELTASSA Take 1 packet (8.4 g total) by mouth daily.   polyethylene glycol packet Commonly known as:  MIRALAX / GLYCOLAX Take 17 g by mouth daily as needed for mild constipation.   pregabalin 75 MG capsule Commonly known as:  LYRICA Take 1 capsule (75 mg total) by mouth daily.   ROBAFEN 100 MG/5ML syrup Generic drug:  guaifenesin Take 200 mg by mouth 3 (three) times daily as needed for cough.   sevelamer carbonate 800 MG tablet Commonly known as:  RENVELA Take 800 mg by mouth 3 (three) times daily with meals.   THERA-M PO Take 1 tablet by mouth 2 (two) times daily.   tiotropium 18 MCG inhalation capsule Commonly known as:  SPIRIVA Place 18 mcg into inhaler and inhale daily.         DISCHARGE INSTRUCTIONS:   DIET:  Cardiac diet and Renal diet  DISCHARGE CONDITION:  Stable  ACTIVITY:  Activity as tolerated  OXYGEN:  Home Oxygen: Yes.     Oxygen Delivery: 2-3 liters/min via Patient connected to nasal cannula oxygen  DISCHARGE LOCATION:  Assisted Living.     If you experience worsening of your admission symptoms, develop shortness of breath, life threatening emergency, suicidal or homicidal thoughts you must seek medical attention immediately by calling 911 or calling your MD immediately  if symptoms less severe.  You Must read complete instructions/literature along with all the possible adverse reactions/side effects for all the Medicines you take and that have been prescribed to you. Take any new Medicines after you have completely understood and accpet all the possible adverse reactions/side effects.   Please note  You were cared for by a hospitalist during your hospital stay. If you have any questions about your discharge medications or the care you received while you were in the hospital after you are discharged, you can call the unit and asked to  speak with the hospitalist on call if the hospitalist that took care of you is not available. Once you are discharged, your primary care physician will handle any further medical issues. Please note that NO REFILLS for any discharge medications will be authorized once you are discharged, as it is imperative that you return to your primary care physician (or establish a relationship with a primary care physician if you do not have one) for your aftercare needs so that they can reassess your need for medications and monitor your lab values.     Today   NO acute events overnight. Shortness of breath improved.  No fever, Hemodynamically stable.   Will d/c to Assisted living On Oral abx.    VITAL SIGNS:  Blood pressure 106/85, pulse 68, temperature 98.1 F (36.7 C), temperature source Oral, resp. rate 20, height 6' 2"  (1.88 m), weight 92.9 kg (204 lb 12.9 oz), SpO2 99 %.  I/O:    Intake/Output Summary (Last 24 hours) at 12/23/2017 1131 Last data filed at 12/22/2017 1805 Gross per 24 hour  Intake -  Output 3052 ml  Net -3052 ml    PHYSICAL EXAMINATION:   GENERAL:  62 y.o.-year-old patient lying in bed in no acute distress.  EYES: Pupils equal, round, reactive to light and accommodation. No scleral icterus. Extraocular muscles intact.  HEENT: Head atraumatic, normocephalic. Oropharynx and nasopharynx clear.  NECK:  Supple, no jugular venous distention. No thyroid enlargement, no tenderness.  LUNGS: Normal breath sounds bilaterally, no wheezing, rales, rhonchi. No use of accessory muscles of respiration.  CARDIOVASCULAR: S1, S2 normal. No murmurs, rubs, or gallops.  ABDOMEN: Soft, nontender, nondistended. Bowel sounds present. No organomegaly or mass.  EXTREMITIES: No cyanosis, clubbing or edema b/l.   Left BKA.   NEUROLOGIC: Cranial nerves II through XII are intact. No focal Motor or sensory deficits b/l.   PSYCHIATRIC: The patient is alert and oriented x 3.  SKIN: No obvious rash,  lesion, or ulcer.   Right groin perm-cath in place.   DATA REVIEW:   CBC Recent Labs  Lab 12/23/17 1011  WBC 8.5  HGB 12.0*  HCT 36.2*  PLT 133*    Chemistries  Recent Labs  Lab 12/21/17 0755 12/23/17 1011  NA 139 140  K 5.0 4.7  CL 101 105  CO2 27 23  GLUCOSE 96 118*  BUN 67* 69*  CREATININE 9.41* 10.04*  CALCIUM 9.1 8.9  AST 42*  --  ALT 35  --   ALKPHOS 159*  --   BILITOT 0.6  --     Cardiac Enzymes Recent Labs  Lab 12/21/17 0755  TROPONINI 0.08*    Microbiology Results  Results for orders placed or performed during the hospital encounter of 12/21/17  Blood Culture (routine x 2)     Status: None (Preliminary result)   Collection Time: 12/21/17  7:55 AM  Result Value Ref Range Status   Specimen Description BLOOD BLOOD RIGHT WRIST  Final   Special Requests   Final    BOTTLES DRAWN AEROBIC AND ANAEROBIC Blood Culture results may not be optimal due to an excessive volume of blood received in culture bottles   Culture   Final    NO GROWTH 2 DAYS Performed at [facility], [address] [place], [place] [address]    Report Status PENDING  Incomplete  Blood Culture (routine x 2)     Status: None (Preliminary result)   Collection Time: 12/21/17  7:57 AM  Result Value Ref Range Status   Specimen Description BLOOD BLOOD RIGHT WRIST  Final   Special Requests   Final    BOTTLES DRAWN AEROBIC AND ANAEROBIC Blood Culture adequate volume   Culture   Final    NO GROWTH 2 DAYS Performed at [facility], [address], [place], [place] [address]    Report Status PENDING  Incomplete  MRSA PCR Screening     Status: Abnormal   Collection Time: 12/21/17 12:11 PM  Result Value Ref Range Status   MRSA by PCR POSITIVE (A) NEGATIVE Final    Comment:        The GeneXpert MRSA Assay (FDA approved for NASAL specimens only), is one component of a comprehensive MRSA colonization surveillance program. It is not intended to diagnose  MRSA infection nor to guide or monitor treatment for MRSA infections. RESULT CALLED TO, READ BACK BY AND VERIFIED WITH: PHYLISS BRIGGS AT 1410 ON 12/21/17 BY SNJ Performed at [facility], [address], [place], [place] [address]     RADIOLOGY:  No results found.    Management plans discussed with the patient, family and they are in agreement.  CODE STATUS:     Code Status Orders  (From admission, onward)        Start     Ordered   12/21/17 1105  Full code  Continuous     12/21/17 1105      TOTAL TIME TAKING CARE OF THIS PATIENT: 40 minutes.    Henreitta Leber M.D on 12/23/2017 at 11:31 AM  Between 7am to 6pm - Pager - [phone]  After 6pm go to [email or web address] - [employer] [facility]  Office  [phone]  CC: Primary care physician; Donnie Coffin, MD

## 2017-12-23 NOTE — NC FL2 (Signed)
[place] LEVEL OF CARE SCREENING TOOL     IDENTIFICATION  Patient Name: [name] Birthdate: 26-Oct-1955 Sex: male Admission Date (Current Location): 12/21/2017  [facility] and [place] Number:  Engineering geologist and Address:  [facility], [address] [place], [place] [address]      Provider Number: [id number]  Attending Physician Name and Address:  Henreitta Leber, MD  Relative Name and Phone Number:       Current Level of Care: Hospital Recommended Level of Care: [place] Prior Approval Number:    Date Approved/Denied:   PASRR Number:    Discharge Plan: (assisted living)    Current Diagnoses: Patient Active Problem List   Diagnosis Date Noted  . Acute on chronic respiratory failure with hypoxia ([place]) 12/21/2017  . Tobacco use 10/07/2017  . Apnea   . End-stage renal disease on hemodialysis ([place])   . Hypervolemia   . Altered mental status   . Acute on chronic systolic CHF (congestive heart failure) ([place]) 01/18/2017  . Pulmonary embolism ([place]) 08/03/2016  . NSTEMI (non-ST elevated myocardial infarction) ([place]) 07/26/2016  . PVD (peripheral vascular disease) ([place]) 03/05/2016  . Preop examination 05/02/2015  . Chronic systolic heart failure ([place])   . Hyperkalemia 01/13/2015  . ESRD (end stage renal disease) on dialysis ([place]) 05/04/2014  . CAD (coronary artery disease) 05/04/2014  . Normocytic anemia 05/04/2014  . Hyperlipidemia 04/26/2010  . HYPERTENSION, BENIGN 04/26/2010  . CAD, NATIVE VESSEL 04/26/2010    Orientation RESPIRATION BLADDER Height & Weight     Self, Time, Situation, Place  Normal, O2(2 liters) Continent Weight: 204 lb 12.9 oz (92.9 kg) Height:  6\' 2"  (188 cm)  BEHAVIORAL SYMPTOMS/MOOD NEUROLOGICAL BOWEL NUTRITION STATUS  (none) (none) Continent Diet  AMBULATORY STATUS COMMUNICATION OF NEEDS Skin   Supervision Verbally Normal                       Personal Care Assistance  Level of Assistance  Bathing, Dressing Bathing Assistance: Independent   Dressing Assistance: Independent     Functional Limitations Info  (no issues)          SPECIAL CARE FACTORS FREQUENCY                       Contractures Contractures Info: Not present    Additional Factors Info  Code Status               DISCHARGE MEDICATIONS:        Allergies as of 12/23/2017      Reactions   Dust Mite Extract Other (See Comments)   Reaction: unknown         Medication List    STOP taking these medications   sodium polystyrene powder Commonly known as:  KAYEXALATE     TAKE these medications   acetaminophen 500 MG tablet Commonly known as:  TYLENOL Take 500 mg by mouth every 6 (six) hours as needed for mild pain.   albuterol 108 (90 Base) MCG/ACT inhaler Commonly known as:  PROVENTIL HFA;VENTOLIN HFA Inhale 2 puffs into the lungs every 6 (six) hours as needed for wheezing or shortness of breath.   amoxicillin-clavulanate 500-125 MG tablet Commonly known as:  AUGMENTIN Take 1 tablet (500 mg total) by mouth daily for 7 days.   apixaban 5 MG Tabs tablet Commonly known as:  ELIQUIS Take 1 tablet (5 mg total) by mouth 2 (two) times daily.  aspirin EC 325 MG tablet Take 325 mg by mouth daily.   atorvastatin 10 MG tablet Commonly known as:  LIPITOR Take 10 mg by mouth at bedtime.   budesonide-formoterol 160-4.5 MCG/ACT inhaler Commonly known as:  SYMBICORT Inhale 2 puffs into the lungs 2 (two) times daily.   calcium acetate 667 MG capsule Commonly known as:  PHOSLO Take 667 mg by mouth daily.   carvedilol 6.25 MG tablet Commonly known as:  COREG Take 1 tablet (6.25 mg total) by mouth 2 (two) times daily.   cetirizine 10 MG tablet Commonly known as:  ZYRTEC Take 10 mg by mouth daily.   COMBIVENT RESPIMAT 20-100 MCG/ACT Aers respimat Generic drug:  Ipratropium-Albuterol Inhale 1 puff into the lungs every 6 (six) hours as  needed for wheezing or shortness of breath.   docusate sodium 100 MG capsule Commonly known as:  COLACE Take 100 mg by mouth daily as needed for mild constipation or moderate constipation.   erythromycin ophthalmic ointment Place 1 application into the right eye at bedtime.   fluticasone 50 MCG/ACT nasal spray Commonly known as:  FLONASE Place 1 spray into both nostrils daily.   hydrocortisone 2.5 % cream Apply 1 application topically 2 (two) times daily as needed (itching).   isosorbide mononitrate 30 MG 24 hr tablet Commonly known as:  IMDUR Take 30 mg by mouth daily.   midodrine 10 MG tablet Commonly known as:  PROAMATINE Take 5 mg by mouth 2 (two) times daily.   nitroGLYCERIN 0.4 MG SL tablet Commonly known as:  NITROSTAT Place 0.4 mg under the tongue every 5 (five) minutes as needed for chest pain.   pantoprazole 20 MG tablet Commonly known as:  PROTONIX Take 20 mg by mouth daily.   patiromer 8.4 g packet Commonly known as:  VELTASSA Take 1 packet (8.4 g total) by mouth daily.   polyethylene glycol packet Commonly known as:  MIRALAX / GLYCOLAX Take 17 g by mouth daily as needed for mild constipation.   pregabalin 75 MG capsule Commonly known as:  LYRICA Take 1 capsule (75 mg total) by mouth daily.   ROBAFEN 100 MG/5ML syrup Generic drug:  guaifenesin Take 200 mg by mouth 3 (three) times daily as needed for cough.   sevelamer carbonate 800 MG tablet Commonly known as:  RENVELA Take 800 mg by mouth 3 (three) times daily with meals.   THERA-M PO Take 1 tablet by mouth 2 (two) times daily.   tiotropium 18 MCG inhalation capsule Commonly known as:  SPIRIVA Place 18 mcg into inhaler and inhale daily.         Additional Information    Shela Leff, LCSW

## 2017-12-26 LAB — CULTURE, BLOOD (ROUTINE X 2)
CULTURE: NO GROWTH
CULTURE: NO GROWTH
Special Requests: ADEQUATE

## 2018-01-06 ENCOUNTER — Encounter (INDEPENDENT_AMBULATORY_CARE_PROVIDER_SITE_OTHER): Payer: Medicare Other

## 2018-01-06 ENCOUNTER — Ambulatory Visit (INDEPENDENT_AMBULATORY_CARE_PROVIDER_SITE_OTHER): Payer: Medicare Other | Admitting: Vascular Surgery

## 2018-01-13 LAB — BLOOD GAS, VENOUS
Acid-Base Excess: 1.7 mmol/L (ref 0.0–2.0)
Bicarbonate: 29.2 mmol/L — ABNORMAL HIGH (ref 20.0–28.0)
PH VEN: 7.31 (ref 7.250–7.430)
Patient temperature: 37
pCO2, Ven: 58 mmHg (ref 44.0–60.0)

## 2018-02-02 ENCOUNTER — Other Ambulatory Visit (INDEPENDENT_AMBULATORY_CARE_PROVIDER_SITE_OTHER): Payer: Self-pay | Admitting: Nurse Practitioner

## 2018-02-02 ENCOUNTER — Encounter (INDEPENDENT_AMBULATORY_CARE_PROVIDER_SITE_OTHER): Payer: Self-pay

## 2018-02-04 MED ORDER — CEFAZOLIN SODIUM-DEXTROSE 1-4 GM/50ML-% IV SOLN: 1.0000 g | Freq: Once | INTRAVENOUS | Status: DC

## 2018-02-05 ENCOUNTER — Encounter
Admission: RE | Disposition: A | Discharge status: Home / Self Care | Payer: Self-pay | Source: Ambulatory Visit | Attending: Vascular Surgery

## 2018-02-05 ENCOUNTER — Ambulatory Visit
Admission: RE | Admit: 2018-02-05 | Discharge: 2018-02-05 | Disposition: A | Discharge status: Home / Self Care | Payer: Medicare Other | Source: Ambulatory Visit | Attending: Vascular Surgery | Admitting: Vascular Surgery

## 2018-02-05 ENCOUNTER — Encounter: Payer: Self-pay | Admitting: Vascular Surgery

## 2018-02-05 DIAGNOSIS — Z89512 Acquired absence of left leg below knee: Secondary | ICD-10-CM | POA: Insufficient documentation

## 2018-02-05 DIAGNOSIS — Z955 Presence of coronary angioplasty implant and graft: Secondary | ICD-10-CM | POA: Insufficient documentation

## 2018-02-05 DIAGNOSIS — Z8249 Family history of ischemic heart disease and other diseases of the circulatory system: Secondary | ICD-10-CM | POA: Diagnosis not present

## 2018-02-05 DIAGNOSIS — F17210 Nicotine dependence, cigarettes, uncomplicated: Secondary | ICD-10-CM | POA: Insufficient documentation

## 2018-02-05 DIAGNOSIS — Z9889 Other specified postprocedural states: Secondary | ICD-10-CM | POA: Diagnosis not present

## 2018-02-05 DIAGNOSIS — Z992 Dependence on renal dialysis: Secondary | ICD-10-CM | POA: Diagnosis not present

## 2018-02-05 DIAGNOSIS — G4730 Sleep apnea, unspecified: Secondary | ICD-10-CM | POA: Insufficient documentation

## 2018-02-05 DIAGNOSIS — I132 Hypertensive heart and chronic kidney disease with heart failure and with stage 5 chronic kidney disease, or end stage renal disease: Secondary | ICD-10-CM | POA: Insufficient documentation

## 2018-02-05 DIAGNOSIS — I5022 Chronic systolic (congestive) heart failure: Secondary | ICD-10-CM | POA: Insufficient documentation

## 2018-02-05 DIAGNOSIS — I429 Cardiomyopathy, unspecified: Secondary | ICD-10-CM | POA: Diagnosis not present

## 2018-02-05 DIAGNOSIS — Z91048 Other nonmedicinal substance allergy status: Secondary | ICD-10-CM | POA: Diagnosis not present

## 2018-02-05 DIAGNOSIS — Z89421 Acquired absence of other right toe(s): Secondary | ICD-10-CM | POA: Insufficient documentation

## 2018-02-05 DIAGNOSIS — J449 Chronic obstructive pulmonary disease, unspecified: Secondary | ICD-10-CM | POA: Insufficient documentation

## 2018-02-05 DIAGNOSIS — T82868A Thrombosis of vascular prosthetic devices, implants and grafts, initial encounter: Secondary | ICD-10-CM | POA: Diagnosis not present

## 2018-02-05 DIAGNOSIS — Z87442 Personal history of urinary calculi: Secondary | ICD-10-CM | POA: Diagnosis not present

## 2018-02-05 DIAGNOSIS — I2510 Atherosclerotic heart disease of native coronary artery without angina pectoris: Secondary | ICD-10-CM | POA: Insufficient documentation

## 2018-02-05 DIAGNOSIS — K219 Gastro-esophageal reflux disease without esophagitis: Secondary | ICD-10-CM | POA: Diagnosis not present

## 2018-02-05 DIAGNOSIS — Z86711 Personal history of pulmonary embolism: Secondary | ICD-10-CM | POA: Insufficient documentation

## 2018-02-05 DIAGNOSIS — E785 Hyperlipidemia, unspecified: Secondary | ICD-10-CM | POA: Diagnosis not present

## 2018-02-05 DIAGNOSIS — N186 End stage renal disease: Secondary | ICD-10-CM | POA: Diagnosis not present

## 2018-02-05 DIAGNOSIS — Z452 Encounter for adjustment and management of vascular access device: Secondary | ICD-10-CM | POA: Insufficient documentation

## 2018-02-05 DIAGNOSIS — I252 Old myocardial infarction: Secondary | ICD-10-CM | POA: Diagnosis not present

## 2018-02-05 DIAGNOSIS — I10 Essential (primary) hypertension: Secondary | ICD-10-CM

## 2018-02-05 HISTORY — PX: DIALYSIS/PERMA CATHETER REMOVAL: CATH118289

## 2018-02-05 SURGERY — DIALYSIS/PERMA CATHETER REMOVAL
Anesthesia: LOCAL

## 2018-02-05 MED ORDER — ONDANSETRON HCL 4 MG/2ML IJ SOLN: 4.0000 mg | Freq: Four times a day (QID) | INTRAMUSCULAR | Status: DC | PRN

## 2018-02-05 MED ORDER — SODIUM CHLORIDE 0.9 % IV SOLN: INTRAVENOUS | Status: DC

## 2018-02-05 MED ORDER — HYDROMORPHONE HCL 1 MG/ML IJ SOLN: 1.0000 mg | Freq: Once | INTRAMUSCULAR | Status: DC | PRN

## 2018-02-05 MED ORDER — LIDOCAINE-EPINEPHRINE (PF) 1 %-1:200000 IJ SOLN
INTRAMUSCULAR | Status: DC | PRN
  Administered 2018-02-05: 10 mL

## 2018-02-05 SURGICAL SUPPLY — 1 items: TRAY LACERAT/PLASTIC (MISCELLANEOUS) ×2 IMPLANT

## 2018-02-05 NOTE — Op Note (Signed)
Operative Note     Preoperative diagnosis:   1. ESRD with functional permanent access  Postoperative diagnosis:  1. ESRD with functional permanent access  Procedure:  Removal of right femoral Permcath  Surgeon:  Leotis Pain, MD  Anesthesia:  Local  EBL:  Minimal  Indication for the Procedure:  The patient has a functional permanent dialysis access and no longer needs their permcath.  This can be removed.  Risks and benefits are discussed and informed consent is obtained.  Description of the Procedure:  The patient's right groin, thigh, and existing catheter were sterilely prepped and draped. The area around the catheter was anesthetized copiously with 1% lidocaine. The catheter was dissected out with curved hemostats until the cuff was freed from the surrounding fibrous sheath. The fiber sheath was transected, and the catheter was then removed in its entirety using gentle traction. Pressure was held and sterile dressings were placed. The patient tolerated the procedure well and was taken to the recovery room in stable condition.     Leotis Pain  02/05/2018, 10:38 AM This note was created with Dragon Medical transcription system. Any errors in dictation are purely unintentional.

## 2018-02-05 NOTE — H&P (Signed)
[facility] Admission History & Physical  MRN : [id number]  [name] is a 62 y.o. (Feb 21, 1956) male who presents with chief complaint of No chief complaint on file. [name]  History of Present Illness: I am asked to evaluate the patient by the dialysis center. The patient was sent here because they have a nonfunctioning tunneled catheter and a functioning arm access.  The patient reports they're not been any problems with any of their dialysis runs. They are reporting good flows with good parameters at dialysis.  Patient denies pain or tenderness overlying the access.  There is no pain with dialysis.  The patient denies hand pain or finger pain consistent with steal syndrome.  No fevers or chills while on dialysis.   Current Facility-Administered Medications  Medication Dose Route Frequency Provider Last Rate Last Dose  . ceFAZolin (ANCEF) IVPB 1 g/50 mL premix  1 g Intravenous Once Kris Hartmann, NP        Past Medical History:  Diagnosis Date  . 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 Amputee, below knee, left ([place])   . Anemia   . Asthma   . Cardiomyopathy ([place])   . CHF (congestive heart failure) ([place])   . Chronic systolic heart failure ([place])   . Complication of anesthesia    hypotension  . COPD (chronic obstructive pulmonary disease) ([place])   . Coronary artery disease   . Dialysis patient ([place])    Mon, Wed, Fri  . End stage renal disease ([place])   . GERD (gastroesophageal reflux disease)   . Headache   . History of kidney stones   . History of pulmonary embolism   . HLD (hyperlipidemia)   . HTN (hypertension)   . Hyperparathyroidism   . Myocardial infarction ([place])   . Peripheral vascular disease ([place])   . Shortness of breath dyspnea   . Sleep apnea    NO C-PAP, Patient stated in process of  "getting one"   . Tobacco dependence     Past Surgical History:  Procedure Laterality Date  . A/V FISTULAGRAM Left 08/14/2017    Procedure: A/V FISTULAGRAM;  Surgeon: Algernon Huxley, MD;  Location: [facility];  Service: Cardiovascular;  Laterality: Left;  . AMPUTATION Left 05/06/2014   Procedure: AMPUTATION BELOW KNEE;  Surgeon: Elam Dutch, MD;  Location: [facility];  Service: Vascular;  Laterality: Left;  . AMPUTATION Right 01/12/2015   Procedure: Foot transmetatarsal amputation;  Surgeon: Algernon Huxley, MD;  Location: [facility];  Service: Vascular;  Laterality: Right;  . APPLICATION OF WOUND VAC Right 03/01/2015   Procedure: Application of Bio-connekt graft and wound vac application to right foot ;  Surgeon: Algernon Huxley, MD;  Location: [facility];  Service: Vascular;  Laterality: Right;  . AV FISTULA PLACEMENT Left   . AV FISTULA PLACEMENT Left 11/28/2016   Procedure: ARTERIOVENOUS (AV) FISTULA CREATION;  Surgeon: Algernon Huxley, MD;  Location: [facility];  Service: Vascular;  Laterality: Left;  . CARDIAC CATHETERIZATION     stent placement   . CORONARY ANGIOPLASTY    . DIALYSIS/PERMA CATHETER INSERTION N/A 07/31/2016   Procedure: Dialysis/Perma Catheter Insertion;  Surgeon: Algernon Huxley, MD;  Location: [facility];  Service: Cardiovascular;  Laterality: N/A;  . DIALYSIS/PERMA CATHETER INSERTION N/A 07/25/2017   Procedure: DIALYSIS/PERMA CATHETER INSERTION and fistulagram;  Surgeon: Algernon Huxley, MD;  Location: [facility];  Service: Cardiovascular;  Laterality: N/A;  .  DIALYSIS/PERMA CATHETER REMOVAL N/A 07/22/2017   Procedure: DIALYSIS/PERMA CATHETER REMOVAL;  Surgeon: Katha Cabal, MD;  Location: [facility];  Service: Cardiovascular;  Laterality: N/A;  . IR FLUORO GUIDE CV LINE LEFT  04/10/2017  . LIGATION OF ARTERIOVENOUS  FISTULA Right 01/31/2016   Procedure: LIGATION OF ARTERIOVENOUS  FISTULA;  Surgeon: Algernon Huxley, MD;  Location: [facility];  Service: Vascular;  Laterality: Right;  . PERIPHERAL VASCULAR CATHETERIZATION Right 12/15/2014   Procedure: Lower Extremity Angiography;  Surgeon: Algernon Huxley, MD;  Location: [facility];  Service: Cardiovascular;  Laterality: Right;  . PERIPHERAL VASCULAR CATHETERIZATION  12/15/2014   Procedure: Lower Extremity Intervention;  Surgeon: Algernon Huxley, MD;  Location: [facility];  Service: Cardiovascular;;  . PERIPHERAL VASCULAR CATHETERIZATION Right 08/14/2015   Procedure: A/V Shuntogram/Fistulagram;  Surgeon: Algernon Huxley, MD;  Location: [facility];  Service: Cardiovascular;  Laterality: Right;  . PERIPHERAL VASCULAR CATHETERIZATION N/A 08/14/2015   Procedure: A/V Shunt Intervention;  Surgeon: Algernon Huxley, MD;  Location: [facility];  Service: Cardiovascular;  Laterality: N/A;  . PERIPHERAL VASCULAR CATHETERIZATION N/A 01/11/2016   Procedure: Dialysis/Perma Catheter Insertion;  Surgeon: Algernon Huxley, MD;  Location: [facility];  Service: Cardiovascular;  Laterality: N/A;  . REVISON OF ARTERIOVENOUS FISTULA Right 02/17/2016   Procedure: removal of AV fistula;  Surgeon: Serafina Mitchell, MD;  Location: [facility];  Service: Vascular;  Laterality: Right;  . REVISON OF ARTERIOVENOUS FISTULA Right 01/31/2016   Procedure: REVISON OF ARTERIOVENOUS FISTULA ( BRACHIOCEPHALIC ) W/ ARTEGRAFT;  Surgeon: Algernon Huxley, MD;  Location: [facility];  Service: Vascular;  Laterality: Right;  . TRANSMETATARSAL AMPUTATION Right 05/04/2015   Procedure: TRANSMETATARSAL AMPUTATION REVISION, great toe amputation;  Surgeon: Algernon Huxley, MD;  Location: [facility];  Service: Vascular;  Laterality: Right;    Social History Social History   Tobacco Use  . Smoking status: Light Tobacco Smoker    Packs/day: 0.25    Years: 30.00    Pack years: 7.50    Types: Cigarettes  . Smokeless tobacco: Never Used  . Tobacco comment: 5  Substance Use Topics  . Alcohol use: No    Alcohol/week: 0.0 standard drinks  . Drug use: No    Comment: has used crack cocaine in past     Family History Family History  Problem Relation Age of Onset  . Leukemia  Mother   . Heart attack Father   . Heart failure Other   . Hypertension Other   . Leukemia Other   . Diabetes Other   . Prostate cancer Neg Hx   . Kidney cancer Neg Hx   . Bladder Cancer Neg Hx     No family history of bleeding or clotting disorders, autoimmune disease or porphyria  Allergies  Allergen Reactions  . Dust Mite Extract Other (See Comments)    Reaction: unknown     REVIEW OF SYSTEMS (Negative unless checked)  Constitutional: [] Weight loss  [] Fever  [] Chills Cardiac: [] Chest pain   [] Chest pressure   [] Palpitations   [] Shortness of breath when laying flat   [] Shortness of breath at rest   [x] Shortness of breath with exertion. Vascular:  [] Pain in legs with walking   [] Pain in legs at rest   [] Pain in legs when laying flat   [] Claudication   [] Pain in feet when walking  [] Pain in feet at rest  [] Pain in feet when laying flat   []   History of DVT   [] Phlebitis   [] Swelling in legs   [] Varicose veins   [] Non-healing ulcers Pulmonary:   [] Uses home oxygen   [] Productive cough   [] Hemoptysis   [] Wheeze  [x] COPD   [] Asthma Neurologic:  [] Dizziness  [] Blackouts   [] Seizures   [] History of stroke   [] History of TIA  [] Aphasia   [] Temporary blindness   [] Dysphagia   [] Weakness or numbness in arms   [] Weakness or numbness in legs Musculoskeletal:  [x] Arthritis   [] Joint swelling   [x] Joint pain   [] Low back pain Hematologic:  [] Easy bruising  [] Easy bleeding   [] Hypercoagulable state   [x] Anemic  [] Hepatitis Gastrointestinal:  [] Blood in stool   [] Vomiting blood  [x] Gastroesophageal reflux/heartburn   [] Difficulty swallowing. Genitourinary:  [x] Chronic kidney disease   [] Difficult urination  [] Frequent urination  [] Burning with urination   [] Blood in urine Skin:  [] Rashes   [x] Ulcers   [x] Wounds Psychological:  [] History of anxiety   []  History of major depression.  Physical Examination  There were no vitals filed for this visit. There is no height or weight on file to calculate  BMI. Gen: WD/WN, NAD Head: [place]/AT, No temporalis wasting.  Ear/Nose/Throat: Hearing grossly intact, nares w/o erythema or drainage, oropharynx w/o Erythema/Exudate,  Eyes: Conjunctiva clear, sclera non-icteric Neck: Trachea midline.  No JVD.  Pulmonary:  Good air movement, respirations not labored, no use of accessory muscles.  Cardiac: RRR, normal S1, S2. Vascular:  Vessel Right Left  Radial Palpable Palpable                 Musculoskeletal: M/S 5/5 throughout.  Right forefoot amputation, left leg amputation Neurologic: Sensation grossly intact in extremities.  Symmetrical.  Speech is fluent. Motor exam as listed above. Psychiatric: Judgment intact, Mood & affect appropriate for pt's clinical situation. Dermatologic: No rashes or ulcers noted.  No cellulitis or open wounds.    CBC Lab Results  Component Value Date   WBC 8.5 12/23/2017   HGB 12.0 (L) 12/23/2017   HCT 36.2 (L) 12/23/2017   MCV 92.8 12/23/2017   PLT 133 (L) 12/23/2017    BMET    Component Value Date/Time   NA 140 12/23/2017 1011   NA 135 (L) 04/12/2014 1341   K 4.7 12/23/2017 1011   K 5.2 01/12/2015 1210   K 3.6 04/12/2014 1341   CL 105 12/23/2017 1011   CL 97 (L) 04/12/2014 1341   CO2 23 12/23/2017 1011   CO2 28 04/12/2014 1341   GLUCOSE 118 (H) 12/23/2017 1011   GLUCOSE 85 04/12/2014 1341   BUN 69 (H) 12/23/2017 1011   BUN 40 (H) 04/12/2014 1341   CREATININE 10.04 (H) 12/23/2017 1011   CREATININE 9.58 (H) 04/12/2014 1341   CALCIUM 8.9 12/23/2017 1011   CALCIUM 9.5 04/12/2014 1341   GFRNONAA 5 (L) 12/23/2017 1011   GFRNONAA 6 (L) 03/15/2014 2323   GFRNONAA 6 (L) 02/10/2014 1119   GFRAA 6 (L) 12/23/2017 1011   GFRAA 7 (L) 03/15/2014 2323   GFRAA 7 (L) 02/10/2014 1119   CrCl cannot be calculated (Patient's most recent lab result is older than the maximum 21 days allowed.).  COAG Lab Results  Component Value Date   INR 1.35 10/25/2017   INR 1.52 07/21/2017   INR 1.25 03/05/2017     Radiology No results found.  Assessment/Plan 1.  Complication dialysis device with thrombosis AV access:  Patient's Tunneled catheter is not being used. The patient has an extremity access that is  functioning well. Therefore, the patient will undergo removal of the tunneled catheter under local anesthesia.  The risks and benefits were described to the patient.  All questions were answered.  The patient agrees to proceed with angiography and intervention. Potassium will be drawn to ensure that it is an appropriate level prior to performing intervention. 2.  End-stage renal disease requiring hemodialysis:  Patient will continue dialysis therapy without further interruption 3.  Hypertension:  Patient will continue medical management; nephrology is following no changes in oral medications. 4. Diabetes mellitus:  Glucose will be monitored and oral medications been held this morning once the patient has undergone the patient's procedure po intake will be reinitiated and again Accu-Cheks will be used to assess the blood glucose level and treat as needed. The patient will be restarted on the patient's usual hypoglycemic regime 5.  Coronary artery disease:  EKG will be monitored. Nitrates will be used if needed. The patient's oral cardiac medications will be continued.    Leotis Pain, MD  02/05/2018 9:38 AM

## 2018-03-10 ENCOUNTER — Encounter

## 2018-03-10 ENCOUNTER — Encounter (INDEPENDENT_AMBULATORY_CARE_PROVIDER_SITE_OTHER): Payer: Self-pay | Admitting: Vascular Surgery

## 2018-03-10 ENCOUNTER — Ambulatory Visit (INDEPENDENT_AMBULATORY_CARE_PROVIDER_SITE_OTHER): Payer: Medicare Other

## 2018-03-10 ENCOUNTER — Other Ambulatory Visit (INDEPENDENT_AMBULATORY_CARE_PROVIDER_SITE_OTHER): Payer: Self-pay | Admitting: Vascular Surgery

## 2018-03-10 ENCOUNTER — Ambulatory Visit (INDEPENDENT_AMBULATORY_CARE_PROVIDER_SITE_OTHER): Payer: Medicare Other | Admitting: Vascular Surgery

## 2018-03-10 VITALS — BP 137/77 | HR 88 | Resp 18 | Ht 72.0 in | Wt 207.0 lb

## 2018-03-10 DIAGNOSIS — I10 Essential (primary) hypertension: Secondary | ICD-10-CM

## 2018-03-10 DIAGNOSIS — N186 End stage renal disease: Secondary | ICD-10-CM

## 2018-03-10 DIAGNOSIS — F17210 Nicotine dependence, cigarettes, uncomplicated: Secondary | ICD-10-CM

## 2018-03-10 DIAGNOSIS — E785 Hyperlipidemia, unspecified: Secondary | ICD-10-CM | POA: Diagnosis not present

## 2018-03-10 DIAGNOSIS — I120 Hypertensive chronic kidney disease with stage 5 chronic kidney disease or end stage renal disease: Secondary | ICD-10-CM

## 2018-03-10 DIAGNOSIS — I739 Peripheral vascular disease, unspecified: Secondary | ICD-10-CM

## 2018-03-10 DIAGNOSIS — Z992 Dependence on renal dialysis: Secondary | ICD-10-CM

## 2018-03-10 NOTE — Assessment & Plan Note (Signed)
His duplex today shows some narrowing up near the cephalic vein subclavian vein confluence although it does not appear particularly high-grade.  The remainder of the fistula has good flow. Continue to use this access.  Contact our office with any prolonged bleeding or poor flow rates.  Otherwise, plan to check this in 6 months with duplex.

## 2018-03-10 NOTE — Assessment & Plan Note (Signed)
S/p left BKA, right TMA. No new symptoms Recheck at next visit

## 2018-03-10 NOTE — Assessment & Plan Note (Signed)
lipid control important in reducing the progression of atherosclerotic disease. Continue statin therapy  

## 2018-03-10 NOTE — Progress Notes (Signed)
MRN : [id number]  [name] is a 62 y.o. ([birth year]) male who presents with chief complaint of  Chief Complaint  Patient presents with  . Follow-up    6 week [facility] HDA follow up  .  History of Present Illness: Patient returns today in follow up of his dialysis access.  His left brachiocephalic AV fistula has been working well.  He says as long as somebody good sticks it it runs well without any problems. He is also doing therapy and seems to be tolerating that well.  He has no complaints or problems with his legs currently.  His PAD has not been checked recently. His duplex today shows some narrowing up near the cephalic vein subclavian vein confluence although it does not appear particularly high-grade.  The remainder of the fistula has good flow.  Current Outpatient Medications  Medication Sig Dispense Refill  . acetaminophen (TYLENOL) 500 MG tablet Take 500 mg by mouth every 6 (six) hours as needed for mild pain.     [name] albuterol (PROVENTIL HFA;VENTOLIN HFA) 108 (90 Base) MCG/ACT inhaler Inhale 2 puffs into the lungs every 6 (six) hours as needed for wheezing or shortness of breath.     [name] apixaban (ELIQUIS) 5 MG TABS tablet Take 1 tablet (5 mg total) by mouth 2 (two) times daily. 60 tablet 2  . aspirin EC 325 MG tablet Take 325 mg by mouth daily.     [name] atorvastatin (LIPITOR) 10 MG tablet Take 10 mg by mouth at bedtime.     . budesonide-formoterol (SYMBICORT) 160-4.5 MCG/ACT inhaler Inhale 2 puffs into the lungs 2 (two) times daily.    . calcium acetate (PHOSLO) 667 MG capsule Take 667 mg by mouth daily.     . carvedilol (COREG) 6.25 MG tablet Take 1 tablet (6.25 mg total) by mouth 2 (two) times daily. 180 tablet 3  . cetirizine (ZYRTEC) 10 MG tablet Take 10 mg by mouth daily.    [name] docusate sodium (COLACE) 100 MG capsule Take 100 mg by mouth daily as needed for mild constipation or moderate constipation.     [name] erythromycin ophthalmic ointment Place 1 application into the right eye  at bedtime.    . fluticasone (FLONASE) 50 MCG/ACT nasal spray Place 1 spray into both nostrils daily.     [name] guaifenesin (ROBAFEN) 100 MG/5ML syrup Take 200 mg by mouth 3 (three) times daily as needed for cough.    . hydrocortisone 2.5 % cream Apply 1 application topically 2 (two) times daily as needed (itching).     . Ipratropium-Albuterol (COMBIVENT RESPIMAT) 20-100 MCG/ACT AERS respimat Inhale 1 puff into the lungs every 6 (six) hours as needed for wheezing or shortness of breath.    . isosorbide mononitrate (IMDUR) 30 MG 24 hr tablet Take 30 mg by mouth daily.     . midodrine (PROAMATINE) 10 MG tablet Take 5 mg by mouth 2 (two) times daily.    . Multiple Vitamins-Minerals (THERA-M PO) Take 1 tablet by mouth 2 (two) times daily.    . nitroGLYCERIN (NITROSTAT) 0.4 MG SL tablet Place 0.4 mg under the tongue every 5 (five) minutes as needed for chest pain.    . pantoprazole (PROTONIX) 20 MG tablet Take 20 mg by mouth daily.    . patiromer (VELTASSA) 8.4 g packet Take 1 packet (8.4 g total) by mouth daily. 30 packet 0  . polyethylene glycol (MIRALAX / GLYCOLAX) packet Take 17 g by mouth daily as needed for mild  constipation.     . pregabalin (LYRICA) 75 MG capsule Take 1 capsule (75 mg total) by mouth daily. 20 capsule 0  . sevelamer carbonate (RENVELA) 800 MG tablet Take 800 mg by mouth 3 (three) times daily with meals.     . tiotropium (SPIRIVA) 18 MCG inhalation capsule Place 18 mcg into inhaler and inhale daily.     No current facility-administered medications for this visit.     Past Medical History:  Diagnosis Date  . 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 Amputee, below knee, left ([place])   . Anemia   . Asthma   . Cardiomyopathy ([place])   . CHF (congestive heart failure) ([place])   . Chronic systolic heart failure ([place])   . Complication of anesthesia    hypotension  . COPD (chronic obstructive pulmonary disease) ([place])   . Coronary artery disease   . Dialysis patient ([place])      Mon, Wed, Fri  . End stage renal disease ([place])   . GERD (gastroesophageal reflux disease)   . Headache   . History of kidney stones   . History of pulmonary embolism   . HLD (hyperlipidemia)   . HTN (hypertension)   . Hyperparathyroidism   . Myocardial infarction ([place])   . Peripheral vascular disease ([place])   . Shortness of breath dyspnea   . Sleep apnea    NO C-PAP, Patient stated in process of  "getting one"   . Tobacco dependence     Past Surgical History:  Procedure Laterality Date  . A/V FISTULAGRAM Left 08/14/2017   Procedure: A/V FISTULAGRAM;  Surgeon: Algernon Huxley, MD;  Location: [place] [facility];  Service: Cardiovascular;  Laterality: Left;  . AMPUTATION Left 05/06/2014   Procedure: AMPUTATION BELOW KNEE;  Surgeon: Elam Dutch, MD;  Location: [place];  Service: Vascular;  Laterality: Left;  . AMPUTATION Right 01/12/2015   Procedure: Foot transmetatarsal amputation;  Surgeon: Algernon Huxley, MD;  Location: [facility];  Service: Vascular;  Laterality: Right;  . APPLICATION OF WOUND VAC Right 03/01/2015   Procedure: Application of Bio-connekt graft and wound vac application to right foot ;  Surgeon: Algernon Huxley, MD;  Location: [facility];  Service: Vascular;  Laterality: Right;  . AV FISTULA PLACEMENT Left   . AV FISTULA PLACEMENT Left 11/28/2016   Procedure: ARTERIOVENOUS (AV) FISTULA CREATION;  Surgeon: Algernon Huxley, MD;  Location: [facility];  Service: Vascular;  Laterality: Left;  . CARDIAC CATHETERIZATION     stent placement   . CORONARY ANGIOPLASTY    . DIALYSIS/PERMA CATHETER INSERTION N/A 07/31/2016   Procedure: Dialysis/Perma Catheter Insertion;  Surgeon: Algernon Huxley, MD;  Location: [place] [facility];  Service: Cardiovascular;  Laterality: N/A;  . DIALYSIS/PERMA CATHETER INSERTION N/A 07/25/2017   Procedure: DIALYSIS/PERMA CATHETER INSERTION and fistulagram;  Surgeon: Algernon Huxley, MD;  Location: [place] [facility];  Service: Cardiovascular;  Laterality: N/A;  .  DIALYSIS/PERMA CATHETER REMOVAL N/A 07/22/2017   Procedure: DIALYSIS/PERMA CATHETER REMOVAL;  Surgeon: Katha Cabal, MD;  Location: [place] [facility];  Service: Cardiovascular;  Laterality: N/A;  . DIALYSIS/PERMA CATHETER REMOVAL N/A 02/05/2018   Procedure: DIALYSIS/PERMA CATHETER REMOVAL;  Surgeon: Algernon Huxley, MD;  Location: [place] [facility];  Service: Cardiovascular;  Laterality: N/A;  . IR FLUORO GUIDE CV LINE LEFT  04/10/2017  . LIGATION OF ARTERIOVENOUS  FISTULA Right 01/31/2016   Procedure: LIGATION OF ARTERIOVENOUS  FISTULA;  Surgeon: Corene Cornea  Bunnie Domino, MD;  Location: [facility];  Service: Vascular;  Laterality: Right;  . PERIPHERAL VASCULAR CATHETERIZATION Right 12/15/2014   Procedure: Lower Extremity Angiography;  Surgeon: Algernon Huxley, MD;  Location: [place] [facility];  Service: Cardiovascular;  Laterality: Right;  . PERIPHERAL VASCULAR CATHETERIZATION  12/15/2014   Procedure: Lower Extremity Intervention;  Surgeon: Algernon Huxley, MD;  Location: [place] [facility];  Service: Cardiovascular;;  . PERIPHERAL VASCULAR CATHETERIZATION Right 08/14/2015   Procedure: A/V Shuntogram/Fistulagram;  Surgeon: Algernon Huxley, MD;  Location: [place] [facility];  Service: Cardiovascular;  Laterality: Right;  . PERIPHERAL VASCULAR CATHETERIZATION N/A 08/14/2015   Procedure: A/V Shunt Intervention;  Surgeon: Algernon Huxley, MD;  Location: [place] [facility];  Service: Cardiovascular;  Laterality: N/A;  . PERIPHERAL VASCULAR CATHETERIZATION N/A 01/11/2016   Procedure: Dialysis/Perma Catheter Insertion;  Surgeon: Algernon Huxley, MD;  Location: [place] [facility];  Service: Cardiovascular;  Laterality: N/A;  . REVISON OF ARTERIOVENOUS FISTULA Right 02/17/2016   Procedure: removal of AV fistula;  Surgeon: Serafina Mitchell, MD;  Location: [facility];  Service: Vascular;  Laterality: Right;  . REVISON OF ARTERIOVENOUS FISTULA Right 01/31/2016   Procedure: REVISON OF ARTERIOVENOUS FISTULA ( BRACHIOCEPHALIC ) W/  ARTEGRAFT;  Surgeon: Algernon Huxley, MD;  Location: [facility];  Service: Vascular;  Laterality: Right;  . TRANSMETATARSAL AMPUTATION Right 05/04/2015   Procedure: TRANSMETATARSAL AMPUTATION REVISION, great toe amputation;  Surgeon: Algernon Huxley, MD;  Location: [facility];  Service: Vascular;  Laterality: Right;    Social History Social History   Tobacco Use  . Smoking status: Light Tobacco Smoker    Packs/day: 0.25    Years: 30.00    Pack years: 7.50    Types: Cigarettes  . Smokeless tobacco: Never Used  . Tobacco comment: 5  Substance Use Topics  . Alcohol use: No    Alcohol/week: 0.0 standard drinks  . Drug use: No    Comment: has used crack cocaine in past      Family History Family History  Problem Relation Age of Onset  . Leukemia Mother   . Heart attack Father   . Heart failure Other   . Hypertension Other   . Leukemia Other   . Diabetes Other   . Prostate cancer Neg Hx   . Kidney cancer Neg Hx   . Bladder Cancer Neg Hx      Allergies  Allergen Reactions  . Dust Mite Extract Other (See Comments)    Reaction: unknown     REVIEW OF SYSTEMS (Negative unless checked)  Constitutional: _0 Weight loss  _1 Fever  _2 Chills Cardiac: _3 Chest pain   _4 Chest pressure   _5 Palpitations   _6 Shortness of breath when laying flat   _7 Shortness of breath at rest   _8 Shortness of breath with exertion. Vascular:  _9 Pain in legs with walking   _10 Pain in legs at rest   _11 Pain in legs when laying flat   _12 Claudication   _13 Pain in feet when walking  _14 Pain in feet at rest  _15 Pain in feet when laying flat   _16 History of DVT   _17 Phlebitis   _18 Swelling in legs   _19 Varicose veins   _20 Non-healing ulcers Pulmonary:   _21 Uses home oxygen   _22 Productive cough   _23 Hemoptysis   _24 Wheeze  _25 COPD   _26 Asthma Neurologic:  _27 Dizziness  _28 Blackouts   _29 Seizures   _30 History of stroke   _31 History of TIA  _32 Aphasia   _33 Temporary blindness   _34 Dysphagia   _35 Weakness  or numbness in arms   _0 Weakness or numbness  in legs Musculoskeletal:  _1 Arthritis   _2 Joint swelling   _3 Joint pain   _4 Low back pain Hematologic:  _5 Easy bruising  _6 Easy bleeding   _7 Hypercoagulable state   _8 Anemic   Gastrointestinal:  _9 Blood in stool   _10 Vomiting blood  _11 Gastroesophageal reflux/heartburn   _12 Abdominal pain Genitourinary:  _13 Chronic kidney disease   _14 Difficult urination  _15 Frequent urination  _16 Burning with urination   _17 Hematuria Skin:  _18 Rashes   _19 Ulcers   _20 Wounds Psychological:  _21 History of anxiety   _22  History of major depression.  Physical Examination  BP 137/77 (BP Location: Right Arm, Patient Position: Sitting)   Pulse 88   Resp 18   Ht 6' (1.829 m)   Wt 207 lb (93.9 kg)   BMI 28.07 kg/m  Gen:  WD/WN, NAD Head: [place]/AT, No temporalis wasting. Ear/Nose/Throat: Hearing grossly intact, nares w/o erythema or drainage Eyes: Conjunctiva clear. Sclera non-icteric Neck: Supple.  Trachea midline Pulmonary:  Good air movement, no use of accessory muscles.  Cardiac:irregular Vascular: soft thrill in left arm AVF Vessel Right Left  Radial Palpable Palpable                                    Musculoskeletal: M/S 5/5 throughout.  Right TMA, left BKA Neurologic: Sensation grossly intact in extremities.  Symmetrical.  Speech is fluent.  Psychiatric: Judgment intact, Mood & affect appropriate for pt's clinical situation. Dermatologic: No rashes or ulcers noted.  No cellulitis or open wounds.       Labs Recent Results (from the past 2160 hour(s))  Comprehensive metabolic panel     Status: Abnormal   Collection Time: 12/21/17  7:55 AM  Result Value Ref Range   Sodium 139 135 - 145 mmol/L   Potassium 5.0 3.5 - 5.1 mmol/L   Chloride 101 98 - 111 mmol/L   CO2 27 22 - 32 mmol/L   Glucose, Bld 96 70 - 99 mg/dL   BUN 67 (H) 8 - 23 mg/dL   Creatinine, Ser 9.41 (H) 0.61 - 1.24 mg/dL   Calcium 9.1 8.9 - 10.3 mg/dL   Total Protein 7.9 6.5 - 8.1 g/dL   Albumin 3.0 (L) 3.5 - 5.0 g/dL   AST 42  (H) 15 - 41 U/L   ALT 35 0 - 44 U/L   Alkaline Phosphatase 159 (H) 38 - 126 U/L   Total Bilirubin 0.6 0.3 - 1.2 mg/dL   GFR calc non Af Amer 5 (L) >60 mL/min   GFR calc Af Amer 6 (L) >60 mL/min    Comment: (NOTE) The eGFR has been calculated using the CKD EPI equation. This calculation has not been validated in all clinical situations. eGFR's persistently <60 mL/min signify possible Chronic Kidney Disease.    Anion gap 11 5 - 15    Comment: Performed at [facility], [address] [place], [place] [address]  CBC WITH DIFFERENTIAL     Status: Abnormal   Collection Time: 12/21/17  7:55 AM  Result Value Ref Range   WBC 11.5 (H) 3.8 - 10.6 K/uL   RBC 4.50 4.40 - 5.90 MIL/uL   Hemoglobin 13.7 13.0 - 18.0 g/dL   HCT 42.3 40.0 - 52.0 %   MCV 93.9 80.0 - 100.0 fL   MCH 30.4 26.0 - 34.0 pg   MCHC 32.4 32.0 - 36.0 g/dL   RDW 15.9 (H) 11.5 -  14.5 %   Platelets 169 150 - 440 K/uL   Neutrophils Relative % 71 %   Neutro Abs 8.1 (H) 1.4 - 6.5 K/uL   Lymphocytes Relative 14 %   Lymphs Abs 1.6 1.0 - 3.6 K/uL   Monocytes Relative 12 %   Monocytes Absolute 1.4 (H) 0.2 - 1.0 K/uL   Eosinophils Relative 2 %   Eosinophils Absolute 0.2 0 - 0.7 K/uL   Basophils Relative 1 %   Basophils Absolute 0.1 0 - 0.1 K/uL    Comment: Performed at [facility], [address] [place], [place] [address]  Blood Culture (routine x 2)     Status: None   Collection Time: 12/21/17  7:55 AM  Result Value Ref Range   Specimen Description BLOOD BLOOD RIGHT WRIST    Special Requests      BOTTLES DRAWN AEROBIC AND ANAEROBIC Blood Culture results may not be optimal due to an excessive volume of blood received in culture bottles   Culture      NO GROWTH 5 DAYS Performed at [facility], [address] [place], [place] [address]    Report Status 12/26/2017 FINAL   Lactic acid, plasma     Status: None   Collection Time: 12/21/17  7:55 AM  Result Value Ref Range   Lactic Acid, Venous  1.5 0.5 - 1.9 mmol/L    Comment: Performed at [facility], [address] [place], [place] [address]  Blood gas, venous     Status: Abnormal   Collection Time: 12/21/17  7:55 AM  Result Value Ref Range   pH, Ven 7.31 7.250 - 7.430   pCO2, Ven 58 44.0 - 60.0 mmHg   Bicarbonate 29.2 (H) 20.0 - 28.0 mmol/L   Acid-Base Excess 1.7 0.0 - 2.0 mmol/L   Patient temperature 37.0    Collection site VENOUS    Sample type VENOUS     Comment: Performed at [facility], [address] [place], [place] [address]  Brain natriuretic peptide     Status: Abnormal   Collection Time: 12/21/17  7:55 AM  Result Value Ref Range   B Natriuretic Peptide 842.0 (H) 0.0 - 100.0 pg/mL    Comment: Performed at [facility], [address] [place], [place] [address]  Troponin I     Status: Abnormal   Collection Time: 12/21/17  7:55 AM  Result Value Ref Range   Troponin I 0.08 (HH) <0.03 ng/mL    Comment: CRITICAL RESULT CALLED TO, READ BACK BY AND VERIFIED WITH JESSICA REED AT 7915 ON 12/21/2017 JJB Performed at [facility], [address] [place], [place] [address]   Blood Culture (routine x 2)     Status: None   Collection Time: 12/21/17  7:57 AM  Result Value Ref Range   Specimen Description BLOOD BLOOD RIGHT WRIST    Special Requests      BOTTLES DRAWN AEROBIC AND ANAEROBIC Blood Culture adequate volume   Culture      NO GROWTH 5 DAYS Performed at [facility], [address] [place], [place] [address]    Report Status 12/26/2017 FINAL   MRSA PCR Screening     Status: Abnormal   Collection Time: 12/21/17 12:11 PM  Result Value Ref Range   MRSA by PCR POSITIVE (A) NEGATIVE    Comment:        The GeneXpert MRSA Assay (FDA approved for NASAL specimens only), is one component of a comprehensive MRSA  colonization surveillance program. It is not intended to diagnose MRSA infection nor to guide or monitor treatment for MRSA infections. RESULT  CALLED TO, READ BACK BY AND VERIFIED WITH: PHYLISS BRIGGS AT 1410 ON 12/21/17 BY SNJ Performed at [facility], [address] [place], [place] [address]   CBC     Status: Abnormal   Collection Time: 12/23/17 10:11 AM  Result Value Ref Range   WBC 8.5 3.8 - 10.6 K/uL   RBC 3.90 (L) 4.40 - 5.90 MIL/uL   Hemoglobin 12.0 (L) 13.0 - 18.0 g/dL   HCT 36.2 (L) 40.0 - 52.0 %   MCV 92.8 80.0 - 100.0 fL   MCH 30.9 26.0 - 34.0 pg   MCHC 33.3 32.0 - 36.0 g/dL   RDW 15.5 (H) 11.5 - 14.5 %   Platelets 133 (L) 150 - 440 K/uL    Comment: Performed at [facility], [address] [place], [place] [address]  Basic metabolic panel     Status: Abnormal   Collection Time: 12/23/17 10:11 AM  Result Value Ref Range   Sodium 140 135 - 145 mmol/L   Potassium 4.7 3.5 - 5.1 mmol/L    Comment: HEMOLYSIS AT THIS LEVEL MAY AFFECT RESULT   Chloride 105 98 - 111 mmol/L   CO2 23 22 - 32 mmol/L   Glucose, Bld 118 (H) 70 - 99 mg/dL   BUN 69 (H) 8 - 23 mg/dL   Creatinine, Ser 10.04 (H) 0.61 - 1.24 mg/dL   Calcium 8.9 8.9 - 10.3 mg/dL   GFR calc non Af Amer 5 (L) >60 mL/min   GFR calc Af Amer 6 (L) >60 mL/min    Comment: (NOTE) The eGFR has been calculated using the CKD EPI equation. This calculation has not been validated in all clinical situations. eGFR's persistently <60 mL/min signify possible Chronic Kidney Disease.    Anion gap 12 5 - 15    Comment: Performed at [facility] [address], [place],  [address]    Radiology No results found.  Assessment/Plan  Hyperlipidemia lipid control important in reducing the progression of atherosclerotic disease. Continue statin therapy   PVD (peripheral vascular disease) (HCC) S/p left BKA, right TMA. No new symptoms Recheck at next visit  HYPERTENSION, BENIGN blood pressure control important in reducing the progression of atherosclerotic disease. On appropriate oral medications.   ESRD (end stage renal  disease) on dialysis [facility]) His duplex today shows some narrowing up near the cephalic vein subclavian vein confluence although it does not appear particularly high-grade.  The remainder of the fistula has good flow. Continue to use this access.  Contact our office with any prolonged bleeding or poor flow rates.  Otherwise, plan to check this in 6 months with duplex.    Leotis Pain, MD  03/10/2018 12:28 PM    This note was created with Dragon medical transcription system.  Any errors from dictation are purely unintentional

## 2018-03-10 NOTE — Assessment & Plan Note (Signed)
blood pressure control important in reducing the progression of atherosclerotic disease. On appropriate oral medications.  

## 2018-03-10 NOTE — Patient Instructions (Signed)
Vascular Access for Hemodialysis A vascular access is a connection between two blood vessels that allows blood to be easily removed from the body and returned to the body during hemodialysis. Hemodialysis is a procedure in which a machine outside of the body filters the blood. There are three types of vascular accesses:  Arteriovenous fistula. This is a connection between an artery and a vein (usually in the arm) that is made by sewing them together. Blood in the artery flows directly into the vein, causing it to get larger over time. This makes it easier for the vein to be used for hemodialysis. An arteriovenous fistula takes 1-6 months to develop after surgery.  Arteriovenous graft. This is a connection between an artery and a vein in the arm that is made with a tube. An arteriovenous graft can be used within 2-3 weeks of surgery.  Venous catheter. This is a thin, flexible tube that is placed in a large vein (usually in the neck, chest, or groin). A venous catheter for hemodialysis contains two tubes that come out of the skin. A venous catheter can be used right away. It is usually used as a temporary access if you need hemodialysis before a fistula or graft has developed. It may also be used as a permanent access if a fistula or graft cannot be created.  Which type of access is best for me? The type of access that is best for you depends on the size and strength of your veins. A fistula is usually the preferred type of access. It can last several years and is less likely than the other types of accesses to become infected or to cause blood clots within a blood vessel (thrombosis). However, a fistula is not an option for everyone. If your veins are not the right size, a graft may be used instead. Grafts require you to have strong veins. If your veins are not strong enough for a graft, a catheter may be used. Catheters are more likely than fistulas and grafts to become infected or to have  thrombosis. Sometimes, only one type of access is an option. Your health care provider will help you determine which type of access is best for you. How is a vascular access used? The way the access is used depends on the type of access:  If the access is a fistula or graft, two needles are inserted through the skin into the access before each hemodialysis session. Blood leaves the body through one of the needles and travels through a tube to the hemodialysis machine (dialyzer). It then flows through another tube and returns to the body through the second needle.  If the access is a catheter, one tube is connected directly to the tube that leads to the dialyzer and the other is connected to a tube that leads away from the dialyzer. Blood leaves the body through one tube and returns to the body through the other.  What kind of problems can occur with vascular accesses?  Blood clots within a blood vessel (thrombosis). Thrombosis can lead to a narrowing of a blood vessel or tube (stenosis). If thrombosis occurs frequently, another access site may be created as a backup.  Infection. These problems are most likely to occur with a venous catheter and least likely to occur with an arteriovenous fistula. How do I care for my vascular access? Wear a medical alert bracelet. This tells health care providers that you are a dialysis patient in the case of an emergency and   allows them to care for your veins appropriately. If you have a graft or fistula:  A "bruit" is a noise that is heard with a stethoscope and a "thrill" is a vibration felt over the graft or fistula. The presence of the bruit and thrill indicates that the access is working. You will be taught to feel for the thrill each day. If this is not felt, the access may be clotted. Call your health care provider.  You may use the arm where your vascular access is located freely after the site heals. Keep the following in mind: ? Avoid pressure on the  arm. ? Avoid lifting heavy objects with the arm. ? Avoid sleeping on the arm. ? Avoid wearing tight-sleeved shirts or jewelry around the graft or fistula.  Do not allow blood pressure monitoring or needle punctures on the side where the graft or fistula is located.  With permission from your health care provider, you may do exercises to help with blood flow through a fistula. These exercises involve squeezing a rubber ball or other soft objects as instructed.  Contact a health care provider if:  Chills develop.  You have an oral temperature above 102 F (38.9 C).  Swelling around the graft or fistula gets worse.  New pain develops.  Pus or other fluid (drainage) is seen at the vascular access site.  Skin redness or red streaking is seen on the skin around, above, or below the vascular access. Get help right away if:  Pain, numbness, or an unusual pale skin color develops in the hand on the side of your fistula.  Dizziness or weakness develops that you have not had before.  The vascular access has bleeding that cannot be easily controlled. This information is not intended to replace advice given to you by your health care provider. Make sure you discuss any questions you have with your health care provider. Document Released: 08/03/2002 Document Revised: 10/19/2015 Document Reviewed: 09/29/2012 Elsevier Interactive Patient Education  2017 [employer]  

## 2018-03-19 ENCOUNTER — Ambulatory Visit: Payer: Medicare Other | Admitting: Family

## 2018-03-27 ENCOUNTER — Ambulatory Visit: Payer: Medicare Other | Admitting: Family

## 2018-03-31 ENCOUNTER — Ambulatory Visit: Payer: Medicare Other | Admitting: Family

## 2018-04-07 ENCOUNTER — Encounter: Payer: Self-pay | Admitting: Family

## 2018-04-07 ENCOUNTER — Ambulatory Visit: Payer: Medicare Other | Attending: Family | Admitting: Family

## 2018-04-07 VITALS — BP 122/73 | HR 75 | Resp 18 | Ht 72.0 in | Wt 210.0 lb

## 2018-04-07 DIAGNOSIS — Z89512 Acquired absence of left leg below knee: Secondary | ICD-10-CM | POA: Insufficient documentation

## 2018-04-07 DIAGNOSIS — Z7982 Long term (current) use of aspirin: Secondary | ICD-10-CM | POA: Diagnosis not present

## 2018-04-07 DIAGNOSIS — Z992 Dependence on renal dialysis: Secondary | ICD-10-CM | POA: Insufficient documentation

## 2018-04-07 DIAGNOSIS — E875 Hyperkalemia: Secondary | ICD-10-CM | POA: Insufficient documentation

## 2018-04-07 DIAGNOSIS — I739 Peripheral vascular disease, unspecified: Secondary | ICD-10-CM | POA: Insufficient documentation

## 2018-04-07 DIAGNOSIS — K219 Gastro-esophageal reflux disease without esophagitis: Secondary | ICD-10-CM | POA: Insufficient documentation

## 2018-04-07 DIAGNOSIS — I5022 Chronic systolic (congestive) heart failure: Secondary | ICD-10-CM

## 2018-04-07 DIAGNOSIS — N186 End stage renal disease: Secondary | ICD-10-CM | POA: Insufficient documentation

## 2018-04-07 DIAGNOSIS — I132 Hypertensive heart and chronic kidney disease with heart failure and with stage 5 chronic kidney disease, or end stage renal disease: Secondary | ICD-10-CM | POA: Insufficient documentation

## 2018-04-07 DIAGNOSIS — I10 Essential (primary) hypertension: Secondary | ICD-10-CM

## 2018-04-07 DIAGNOSIS — Z8249 Family history of ischemic heart disease and other diseases of the circulatory system: Secondary | ICD-10-CM | POA: Diagnosis not present

## 2018-04-07 DIAGNOSIS — D649 Anemia, unspecified: Secondary | ICD-10-CM | POA: Diagnosis not present

## 2018-04-07 DIAGNOSIS — Z7901 Long term (current) use of anticoagulants: Secondary | ICD-10-CM | POA: Insufficient documentation

## 2018-04-07 DIAGNOSIS — Z79899 Other long term (current) drug therapy: Secondary | ICD-10-CM | POA: Insufficient documentation

## 2018-04-07 DIAGNOSIS — F17210 Nicotine dependence, cigarettes, uncomplicated: Secondary | ICD-10-CM | POA: Diagnosis not present

## 2018-04-07 DIAGNOSIS — I252 Old myocardial infarction: Secondary | ICD-10-CM | POA: Diagnosis not present

## 2018-04-07 DIAGNOSIS — Z89431 Acquired absence of right foot: Secondary | ICD-10-CM | POA: Insufficient documentation

## 2018-04-07 DIAGNOSIS — E785 Hyperlipidemia, unspecified: Secondary | ICD-10-CM | POA: Diagnosis not present

## 2018-04-07 DIAGNOSIS — J449 Chronic obstructive pulmonary disease, unspecified: Secondary | ICD-10-CM | POA: Diagnosis not present

## 2018-04-07 DIAGNOSIS — Z86711 Personal history of pulmonary embolism: Secondary | ICD-10-CM | POA: Insufficient documentation

## 2018-04-07 DIAGNOSIS — I2510 Atherosclerotic heart disease of native coronary artery without angina pectoris: Secondary | ICD-10-CM | POA: Diagnosis not present

## 2018-04-07 DIAGNOSIS — Z87442 Personal history of urinary calculi: Secondary | ICD-10-CM | POA: Diagnosis not present

## 2018-04-07 DIAGNOSIS — I429 Cardiomyopathy, unspecified: Secondary | ICD-10-CM | POA: Insufficient documentation

## 2018-04-07 DIAGNOSIS — G4733 Obstructive sleep apnea (adult) (pediatric): Secondary | ICD-10-CM | POA: Diagnosis not present

## 2018-04-07 NOTE — Patient Instructions (Signed)
Continue weighing daily and call for an overnight weight gain of > 2 pounds or a weekly weight gain of >5 pounds. 

## 2018-04-07 NOTE — Progress Notes (Signed)
Patient ID: [name], male    DOB: 06-22-1955, 62 y.o.   MRN: [id number]  HPI  [name] is a 62 y/o male with a history of asthma, CAD, hyperlipidemia, HTN, CKD (ESRD on dialysis), anemia, COPD, GERD, PVD, MI, obstructive sleep apnea, left BKA, current tobacco use and chronic heart failure.   Echo report from 07/23/17 reviewed and showed an EF of 20-25%.  Admitted 12/21/17 due to sepsis and pneumonia. Given IV antibiotics and then given oral antibiotics at discharge. Discharged after 2 days. Was in the ED 10/25/17 due to subconjunctival hemorrhage of his right eye where he was evaluated and released.   He presents today for a follow-up visit with a chief complaint of moderate fatigue upon minimal exertion. He describes this as chronic in nature having been present for several years. He has associated shortness of breath, occasional chest pain, edema, dizziness and weakness along with this. He denies any difficulty sleeping, abdominal distention, palpitations, wheezing or headaches.   Past Medical History:  Diagnosis Date  . 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 Amputee, below knee, left ([place])   . Anemia   . Asthma   . Cardiomyopathy ([place])   . CHF (congestive heart failure) ([place])   . Chronic systolic heart failure ([place])   . Complication of anesthesia    hypotension  . COPD (chronic obstructive pulmonary disease) ([place])   . Coronary artery disease   . Dialysis patient ([place])    Mon, Wed, Fri  . End stage renal disease ([place])   . GERD (gastroesophageal reflux disease)   . Headache   . History of kidney stones   . History of pulmonary embolism   . HLD (hyperlipidemia)   . HTN (hypertension)   . Hyperparathyroidism   . Myocardial infarction ([place])   . Peripheral vascular disease ([place])   . Shortness of breath dyspnea   . Sleep apnea    NO C-PAP, Patient stated in process of  "getting one"   . Tobacco dependence    Past Surgical History:  Procedure Laterality Date   . A/V FISTULAGRAM Left 08/14/2017   Procedure: A/V FISTULAGRAM;  Surgeon: Algernon Huxley, MD;  Location: [facility];  Service: Cardiovascular;  Laterality: Left;  . AMPUTATION Left 05/06/2014   Procedure: AMPUTATION BELOW KNEE;  Surgeon: Elam Dutch, MD;  Location: [facility];  Service: Vascular;  Laterality: Left;  . AMPUTATION Right 01/12/2015   Procedure: Foot transmetatarsal amputation;  Surgeon: Algernon Huxley, MD;  Location: [facility];  Service: Vascular;  Laterality: Right;  . APPLICATION OF WOUND VAC Right 03/01/2015   Procedure: Application of Bio-connekt graft and wound vac application to right foot ;  Surgeon: Algernon Huxley, MD;  Location: [facility];  Service: Vascular;  Laterality: Right;  . AV FISTULA PLACEMENT Left   . AV FISTULA PLACEMENT Left 11/28/2016   Procedure: ARTERIOVENOUS (AV) FISTULA CREATION;  Surgeon: Algernon Huxley, MD;  Location: [facility];  Service: Vascular;  Laterality: Left;  . CARDIAC CATHETERIZATION     stent placement   . CORONARY ANGIOPLASTY    . DIALYSIS/PERMA CATHETER INSERTION N/A 07/31/2016   Procedure: Dialysis/Perma Catheter Insertion;  Surgeon: Algernon Huxley, MD;  Location: [facility];  Service: Cardiovascular;  Laterality: N/A;  . DIALYSIS/PERMA CATHETER INSERTION N/A 07/25/2017   Procedure: DIALYSIS/PERMA CATHETER INSERTION and fistulagram;  Surgeon: Algernon Huxley, MD;  Location: [facility];  Service: Cardiovascular;  Laterality:  N/A;  . DIALYSIS/PERMA CATHETER REMOVAL N/A 07/22/2017   Procedure: DIALYSIS/PERMA CATHETER REMOVAL;  Surgeon: Katha Cabal, MD;  Location: [facility];  Service: Cardiovascular;  Laterality: N/A;  . DIALYSIS/PERMA CATHETER REMOVAL N/A 02/05/2018   Procedure: DIALYSIS/PERMA CATHETER REMOVAL;  Surgeon: Algernon Huxley, MD;  Location: [facility];  Service: Cardiovascular;  Laterality: N/A;  . IR FLUORO GUIDE CV LINE LEFT  04/10/2017  . LIGATION OF ARTERIOVENOUS  FISTULA Right 01/31/2016    Procedure: LIGATION OF ARTERIOVENOUS  FISTULA;  Surgeon: Algernon Huxley, MD;  Location: [facility];  Service: Vascular;  Laterality: Right;  . PERIPHERAL VASCULAR CATHETERIZATION Right 12/15/2014   Procedure: Lower Extremity Angiography;  Surgeon: Algernon Huxley, MD;  Location: [facility];  Service: Cardiovascular;  Laterality: Right;  . PERIPHERAL VASCULAR CATHETERIZATION  12/15/2014   Procedure: Lower Extremity Intervention;  Surgeon: Algernon Huxley, MD;  Location: [facility];  Service: Cardiovascular;;  . PERIPHERAL VASCULAR CATHETERIZATION Right 08/14/2015   Procedure: A/V Shuntogram/Fistulagram;  Surgeon: Algernon Huxley, MD;  Location: [facility];  Service: Cardiovascular;  Laterality: Right;  . PERIPHERAL VASCULAR CATHETERIZATION N/A 08/14/2015   Procedure: A/V Shunt Intervention;  Surgeon: Algernon Huxley, MD;  Location: [facility];  Service: Cardiovascular;  Laterality: N/A;  . PERIPHERAL VASCULAR CATHETERIZATION N/A 01/11/2016   Procedure: Dialysis/Perma Catheter Insertion;  Surgeon: Algernon Huxley, MD;  Location: [facility];  Service: Cardiovascular;  Laterality: N/A;  . REVISON OF ARTERIOVENOUS FISTULA Right 02/17/2016   Procedure: removal of AV fistula;  Surgeon: Serafina Mitchell, MD;  Location: [facility];  Service: Vascular;  Laterality: Right;  . REVISON OF ARTERIOVENOUS FISTULA Right 01/31/2016   Procedure: REVISON OF ARTERIOVENOUS FISTULA ( BRACHIOCEPHALIC ) W/ ARTEGRAFT;  Surgeon: Algernon Huxley, MD;  Location: [facility];  Service: Vascular;  Laterality: Right;  . TRANSMETATARSAL AMPUTATION Right 05/04/2015   Procedure: TRANSMETATARSAL AMPUTATION REVISION, great toe amputation;  Surgeon: Algernon Huxley, MD;  Location: [facility];  Service: Vascular;  Laterality: Right;   Family History  Problem Relation Age of Onset  . Leukemia Mother   . Heart attack Father   . Heart failure Other   . Hypertension Other   . Leukemia Other   . Diabetes Other   . Prostate cancer Neg  Hx   . Kidney cancer Neg Hx   . Bladder Cancer Neg Hx    Social History   Tobacco Use  . Smoking status: Light Tobacco Smoker    Packs/day: 0.25    Years: 30.00    Pack years: 7.50    Types: Cigarettes  . Smokeless tobacco: Never Used  . Tobacco comment: 5  Substance Use Topics  . Alcohol use: No    Alcohol/week: 0.0 standard drinks   Allergies  Allergen Reactions  . Dust Mite Extract Other (See Comments)    Reaction: unknown   Prior to Admission medications   Medication Sig Start Date End Date Taking? Authorizing Provider  acetaminophen (TYLENOL) 500 MG tablet Take 500 mg by mouth every 6 (six) hours as needed for mild pain.    Yes [provider]  albuterol (PROVENTIL HFA;VENTOLIN HFA) 108 (90 Base) MCG/ACT inhaler Inhale 2 puffs into the lungs every 6 (six) hours as needed for wheezing or shortness of breath.    Yes [provider]  apixaban (ELIQUIS) 5 MG TABS tablet Take 1 tablet (5 mg total) by mouth 2 (two) times daily.  01/20/17  Yes Gladstone Lighter, MD  aspirin EC 325 MG tablet Take 325 mg by mouth daily.    Yes [provider]  atorvastatin (LIPITOR) 10 MG tablet Take 10 mg by mouth at bedtime.    Yes [provider]  budesonide-formoterol (SYMBICORT) 160-4.5 MCG/ACT inhaler Inhale 2 puffs into the lungs 2 (two) times daily.   Yes [provider]  calcium acetate (PHOSLO) 667 MG capsule Take 667 mg by mouth daily.    Yes [provider]  carvedilol (COREG) 6.25 MG tablet Take 1 tablet (6.25 mg total) by mouth 2 (two) times daily. 10/07/17  Yes , Otila Kluver A, FNP  cetirizine (ZYRTEC) 10 MG tablet Take 10 mg by mouth daily.   Yes [provider]  docusate sodium (COLACE) 100 MG capsule Take 100 mg by mouth daily as needed for mild constipation or moderate constipation.    Yes [provider]  erythromycin ophthalmic ointment Place 1 application into the right eye at bedtime.   Yes [provider]  fluticasone (FLONASE) 50 MCG/ACT nasal spray Place 1 spray into both nostrils daily.    Yes [provider]  guaifenesin (ROBAFEN) 100 MG/5ML syrup Take 200 mg by mouth 3 (three) times daily as needed for cough.   Yes [provider]  hydrocortisone 2.5 % cream Apply 1 application topically 2 (two) times daily as needed (itching).    Yes [provider]  Ipratropium-Albuterol (COMBIVENT RESPIMAT) 20-100 MCG/ACT AERS respimat Inhale 1 puff into the lungs every 6 (six) hours as needed for wheezing or shortness of breath.   Yes [provider]  isosorbide mononitrate (IMDUR) 30 MG 24 hr tablet Take 30 mg by mouth daily.  08/22/16  Yes [provider]  levofloxacin (LEVAQUIN) 250 MG tablet Take 250 mg by mouth every other day. 04/02/18 04/12/18 Yes [provider]  midodrine (PROAMATINE) 10 MG tablet Take 5 mg by mouth 2 (two) times daily.   Yes [provider]  Multiple Vitamins-Minerals (THERA-M PO) Take 1 tablet by mouth 2 (two) times daily.   Yes [provider]  nitroGLYCERIN (NITROSTAT) 0.4 MG SL tablet Place 0.4 mg under the tongue every 5 (five) minutes as needed for chest pain.   Yes [provider]  pantoprazole (PROTONIX) 20 MG tablet Take 20 mg by mouth daily.   Yes [provider]  polyethylene glycol (MIRALAX / GLYCOLAX) packet Take 17 g by mouth daily as needed for mild constipation.    Yes [provider]  pregabalin (LYRICA) 75 MG capsule Take 1 capsule (75 mg total) by mouth daily. 01/22/17  Yes Gladstone Lighter, MD  sevelamer carbonate (RENVELA) 800 MG tablet Take 800 mg by mouth 3 (three) times daily with meals.    Yes [provider]  tiotropium (SPIRIVA) 18 MCG inhalation capsule Place 18 mcg into inhaler and inhale daily.   Yes [provider]  patiromer (VELTASSA) 8.4 g packet Take 1 packet (8.4 g total) by mouth daily. Patient not taking: Reported on  04/07/2018 07/26/17   Bettey Costa, MD    Review of Systems  Constitutional: Positive for fatigue (tire easily). Negative for appetite change.  HENT: Positive for nosebleeds. Negative for congestion, postnasal drip and sore throat.   Eyes: Negative for visual disturbance.  Respiratory: Positive for shortness of breath (wearing oxygen at 3L at bedtime and PRN during the day). Negative for chest tightness and wheezing.   Cardiovascular: Positive for chest pain (with physical therapy) and  leg swelling. Negative for palpitations.  Gastrointestinal: Negative for abdominal distention and abdominal pain.  Endocrine: Negative.   Genitourinary: Negative.   Musculoskeletal: Positive for arthralgias (left leg phantom pain).  Skin: Negative.   Allergic/Immunologic: Negative.   Neurological: Positive for dizziness (last night) and weakness (right leg). Negative for light-headedness and headaches.  Hematological: Negative for adenopathy. Does not bruise/bleed easily.  Psychiatric/Behavioral: Negative for dysphoric mood and sleep disturbance (wearing oxygen/ humidifier at bedtime). The patient is not nervous/anxious.    Vitals:   04/07/18 1253  BP: 122/73  Pulse: 75  Resp: 18  SpO2: 100%  Weight: 210 lb (95.3 kg)  Height: 6' (1.829 m)   Wt Readings from Last 3 Encounters:  04/07/18 210 lb (95.3 kg)  03/10/18 207 lb (93.9 kg)  12/22/17 204 lb 12.9 oz (92.9 kg)   Lab Results  Component Value Date   CREATININE 10.04 (H) 12/23/2017   CREATININE 9.41 (H) 12/21/2017   CREATININE 6.80 (H) 09/19/2017    Physical Exam  Constitutional: He is oriented to person, place, and time. He appears well-developed and well-nourished.  HENT:  Head: Normocephalic and atraumatic.  Neck: Normal range of motion. Neck supple. No JVD present.  Cardiovascular: Normal rate and regular rhythm.  Pulmonary/Chest: Effort normal. He has no wheezes. He has no rales.  Abdominal: Soft. He exhibits no distension. There is no  tenderness.  Musculoskeletal: He exhibits edema (trace pitting in right lower leg). He exhibits no tenderness.  Neurological: He is alert and oriented to person, place, and time.  Skin: Skin is warm and dry.  Psychiatric: He has a normal mood and affect. His behavior is normal. Thought content normal.  Nursing note and vitals reviewed.  Assessment & Plan:  1: Chronic heart failure with reduced ejection fraction- - NYHA class III - euvolemic today - not being weighed due to left BKA and weakness in his right lower leg  - not adding salt to his food and he says that [employer] with salt either - is receiving physical therapy daily to assist with walking - saw cardiology Nehemiah Massed) 03/03/18 - saw pulmonologist Ashby Dawes) 12/02/17 - due to history of hyperkalemia, doubtful we can use entresto - BNP on 12/21/17 was 842.0 - reports receiving his flu vaccine for this season   2: HTN- - BP looks good today; says that it drops at dialysis - sees PCP Clide Deutscher) at [place] on 12/23/17 reviewed and showed sodium 140, potassium 4.7, creatinine 10.04 and GFR 6  3: ESRD- - receives dialysis M, W, F - saw vascular ([name]) 03/10/18  Facility medication list was reviewed.  Return in 4 months or sooner for any questions/problems before then.

## 2018-04-22 ENCOUNTER — Other Ambulatory Visit: Payer: Self-pay

## 2018-04-22 ENCOUNTER — Inpatient Hospital Stay
Admission: EM | Admit: 2018-04-22 | Discharge: 2018-04-24 | DRG: 193 | Disposition: A | Discharge status: Nursing Home (Includes ICF) | Payer: Medicare Other | Source: Other Acute Inpatient Hospital | Attending: Internal Medicine | Admitting: Internal Medicine

## 2018-04-22 ENCOUNTER — Inpatient Hospital Stay: Payer: Medicare Other

## 2018-04-22 ENCOUNTER — Emergency Department: Payer: Medicare Other

## 2018-04-22 DIAGNOSIS — Z86711 Personal history of pulmonary embolism: Secondary | ICD-10-CM | POA: Diagnosis not present

## 2018-04-22 DIAGNOSIS — J189 Pneumonia, unspecified organism: Principal | ICD-10-CM | POA: Diagnosis present

## 2018-04-22 DIAGNOSIS — Z9981 Dependence on supplemental oxygen: Secondary | ICD-10-CM

## 2018-04-22 DIAGNOSIS — I428 Other cardiomyopathies: Secondary | ICD-10-CM | POA: Diagnosis present

## 2018-04-22 DIAGNOSIS — N2581 Secondary hyperparathyroidism of renal origin: Secondary | ICD-10-CM | POA: Diagnosis present

## 2018-04-22 DIAGNOSIS — Z79899 Other long term (current) drug therapy: Secondary | ICD-10-CM

## 2018-04-22 DIAGNOSIS — I2510 Atherosclerotic heart disease of native coronary artery without angina pectoris: Secondary | ICD-10-CM | POA: Diagnosis present

## 2018-04-22 DIAGNOSIS — I252 Old myocardial infarction: Secondary | ICD-10-CM

## 2018-04-22 DIAGNOSIS — G4730 Sleep apnea, unspecified: Secondary | ICD-10-CM | POA: Diagnosis present

## 2018-04-22 DIAGNOSIS — E213 Hyperparathyroidism, unspecified: Secondary | ICD-10-CM | POA: Diagnosis present

## 2018-04-22 DIAGNOSIS — J440 Chronic obstructive pulmonary disease with acute lower respiratory infection: Secondary | ICD-10-CM | POA: Diagnosis present

## 2018-04-22 DIAGNOSIS — E8770 Fluid overload, unspecified: Secondary | ICD-10-CM | POA: Diagnosis present

## 2018-04-22 DIAGNOSIS — I9589 Other hypotension: Secondary | ICD-10-CM | POA: Diagnosis present

## 2018-04-22 DIAGNOSIS — Z8249 Family history of ischemic heart disease and other diseases of the circulatory system: Secondary | ICD-10-CM

## 2018-04-22 DIAGNOSIS — Z7951 Long term (current) use of inhaled steroids: Secondary | ICD-10-CM

## 2018-04-22 DIAGNOSIS — E785 Hyperlipidemia, unspecified: Secondary | ICD-10-CM | POA: Diagnosis present

## 2018-04-22 DIAGNOSIS — J988 Other specified respiratory disorders: Secondary | ICD-10-CM

## 2018-04-22 DIAGNOSIS — I739 Peripheral vascular disease, unspecified: Secondary | ICD-10-CM | POA: Diagnosis present

## 2018-04-22 DIAGNOSIS — F17210 Nicotine dependence, cigarettes, uncomplicated: Secondary | ICD-10-CM | POA: Diagnosis present

## 2018-04-22 DIAGNOSIS — J9621 Acute and chronic respiratory failure with hypoxia: Secondary | ICD-10-CM | POA: Diagnosis present

## 2018-04-22 DIAGNOSIS — I132 Hypertensive heart and chronic kidney disease with heart failure and with stage 5 chronic kidney disease, or end stage renal disease: Secondary | ICD-10-CM | POA: Diagnosis present

## 2018-04-22 DIAGNOSIS — Z89421 Acquired absence of other right toe(s): Secondary | ICD-10-CM

## 2018-04-22 DIAGNOSIS — K219 Gastro-esophageal reflux disease without esophagitis: Secondary | ICD-10-CM | POA: Diagnosis present

## 2018-04-22 DIAGNOSIS — Z992 Dependence on renal dialysis: Secondary | ICD-10-CM

## 2018-04-22 DIAGNOSIS — Z22322 Carrier or suspected carrier of Methicillin resistant Staphylococcus aureus: Secondary | ICD-10-CM

## 2018-04-22 DIAGNOSIS — Z89512 Acquired absence of left leg below knee: Secondary | ICD-10-CM | POA: Diagnosis not present

## 2018-04-22 DIAGNOSIS — N186 End stage renal disease: Secondary | ICD-10-CM | POA: Diagnosis present

## 2018-04-22 DIAGNOSIS — I5033 Acute on chronic diastolic (congestive) heart failure: Secondary | ICD-10-CM | POA: Diagnosis present

## 2018-04-22 DIAGNOSIS — Z91048 Other nonmedicinal substance allergy status: Secondary | ICD-10-CM

## 2018-04-22 DIAGNOSIS — D631 Anemia in chronic kidney disease: Secondary | ICD-10-CM | POA: Diagnosis present

## 2018-04-22 DIAGNOSIS — R0602 Shortness of breath: Secondary | ICD-10-CM | POA: Diagnosis present

## 2018-04-22 DIAGNOSIS — Z7982 Long term (current) use of aspirin: Secondary | ICD-10-CM

## 2018-04-22 DIAGNOSIS — Z7901 Long term (current) use of anticoagulants: Secondary | ICD-10-CM

## 2018-04-22 LAB — BLOOD GAS, ARTERIAL
Acid-Base Excess: 2.7 mmol/L — ABNORMAL HIGH (ref 0.0–2.0)
BICARBONATE: 26.2 mmol/L (ref 20.0–28.0)
FIO2: 0.32
O2 Saturation: 90.7 %
Patient temperature: 37
pCO2 arterial: 36 mmHg (ref 32.0–48.0)
pH, Arterial: 7.47 — ABNORMAL HIGH (ref 7.350–7.450)
pO2, Arterial: 56 mmHg — ABNORMAL LOW (ref 83.0–108.0)

## 2018-04-22 LAB — CBC WITH DIFFERENTIAL/PLATELET
Abs Immature Granulocytes: 0.12 10*3/uL — ABNORMAL HIGH (ref 0.00–0.07)
Basophils Absolute: 0 10*3/uL (ref 0.0–0.1)
Basophils Relative: 1 %
EOS PCT: 2 %
Eosinophils Absolute: 0.2 10*3/uL (ref 0.0–0.5)
HEMATOCRIT: 37.7 % — AB (ref 39.0–52.0)
Hemoglobin: 12.1 g/dL — ABNORMAL LOW (ref 13.0–17.0)
Immature Granulocytes: 2 %
LYMPHS PCT: 17 %
Lymphs Abs: 1.2 10*3/uL (ref 0.7–4.0)
MCH: 30.9 pg (ref 26.0–34.0)
MCHC: 32.1 g/dL (ref 30.0–36.0)
MCV: 96.4 fL (ref 80.0–100.0)
MONO ABS: 0.9 10*3/uL (ref 0.1–1.0)
Monocytes Relative: 13 %
NEUTROS ABS: 4.7 10*3/uL (ref 1.7–7.7)
Neutrophils Relative %: 65 %
Platelets: 174 10*3/uL (ref 150–400)
RBC: 3.91 MIL/uL — AB (ref 4.22–5.81)
RDW: 15.2 % (ref 11.5–15.5)
WBC: 7.2 10*3/uL (ref 4.0–10.5)
nRBC: 0 % (ref 0.0–0.2)

## 2018-04-22 LAB — COMPREHENSIVE METABOLIC PANEL
ALBUMIN: 3.3 g/dL — AB (ref 3.5–5.0)
ALT: 32 U/L (ref 0–44)
AST: 40 U/L (ref 15–41)
Alkaline Phosphatase: 144 U/L — ABNORMAL HIGH (ref 38–126)
Anion gap: 10 (ref 5–15)
BILIRUBIN TOTAL: 0.9 mg/dL (ref 0.3–1.2)
BUN: 44 mg/dL — ABNORMAL HIGH (ref 8–23)
CHLORIDE: 96 mmol/L — AB (ref 98–111)
CO2: 31 mmol/L (ref 22–32)
CREATININE: 7.21 mg/dL — AB (ref 0.61–1.24)
Calcium: 9.1 mg/dL (ref 8.9–10.3)
GFR calc Af Amer: 8 mL/min — ABNORMAL LOW (ref >60)
GFR, EST NON AFRICAN AMERICAN: 7 mL/min — AB (ref >60)
GLUCOSE: 84 mg/dL (ref 70–99)
POTASSIUM: 3.9 mmol/L (ref 3.5–5.1)
Sodium: 137 mmol/L (ref 135–145)
Total Protein: 7.9 g/dL (ref 6.5–8.1)

## 2018-04-22 LAB — BRAIN NATRIURETIC PEPTIDE: B Natriuretic Peptide: 1672 pg/mL — ABNORMAL HIGH (ref 0.0–100.0)

## 2018-04-22 LAB — INFLUENZA PANEL BY PCR (TYPE A & B)
INFLAPCR: NEGATIVE
Influenza B By PCR: NEGATIVE

## 2018-04-22 LAB — GLUCOSE, CAPILLARY
Glucose-Capillary: 66 mg/dL — ABNORMAL LOW (ref 70–99)
Glucose-Capillary: 76 mg/dL (ref 70–99)

## 2018-04-22 LAB — LIPASE, BLOOD: Lipase: 40 U/L (ref 11–51)

## 2018-04-22 MED ORDER — TIOTROPIUM BROMIDE MONOHYDRATE 18 MCG IN CAPS
18.0000 ug | ORAL_CAPSULE | Freq: Every day | RESPIRATORY_TRACT | Status: DC
  Administered 2018-04-23 (×2): 18 ug via RESPIRATORY_TRACT
  Filled 2018-04-22: qty 5

## 2018-04-22 MED ORDER — LIDOCAINE VISCOUS HCL 2 % MT SOLN
15.0000 mL | OROMUCOSAL | Status: DC | PRN
  Administered 2018-04-22: 15 mL via OROMUCOSAL
  Filled 2018-04-22 (×2): qty 15

## 2018-04-22 MED ORDER — POLYETHYLENE GLYCOL 3350 17 G PO PACK: 17.0000 g | PACK | Freq: Every day | ORAL | Status: DC | PRN

## 2018-04-22 MED ORDER — NITROGLYCERIN 0.4 MG SL SUBL: 0.4000 mg | SUBLINGUAL_TABLET | SUBLINGUAL | Status: DC | PRN

## 2018-04-22 MED ORDER — DIPHENHYDRAMINE HCL 50 MG/ML IJ SOLN
25.0000 mg | Freq: Once | INTRAMUSCULAR | Status: AC
  Administered 2018-04-22: 25 mg via INTRAVENOUS
  Filled 2018-04-22: qty 1

## 2018-04-22 MED ORDER — VANCOMYCIN HCL IN DEXTROSE 1-5 GM/200ML-% IV SOLN
1000.0000 mg | Freq: Once | INTRAVENOUS | Status: AC
  Administered 2018-04-22: 1000 mg via INTRAVENOUS
  Filled 2018-04-22: qty 200

## 2018-04-22 MED ORDER — PREGABALIN 75 MG PO CAPS
75.0000 mg | ORAL_CAPSULE | Freq: Every day | ORAL | Status: DC
Start: 2018-04-22 — End: 2018-04-24
  Administered 2018-04-22 – 2018-04-23 (×2): 75 mg via ORAL
  Filled 2018-04-22 (×2): qty 1

## 2018-04-22 MED ORDER — FAMOTIDINE IN NACL 20-0.9 MG/50ML-% IV SOLN
20.0000 mg | Freq: Two times a day (BID) | INTRAVENOUS | Status: DC
  Administered 2018-04-22 – 2018-04-23 (×3): 20 mg via INTRAVENOUS
  Filled 2018-04-22 (×5): qty 50

## 2018-04-22 MED ORDER — ADULT MULTIVITAMIN W/MINERALS CH
1.0000 | ORAL_TABLET | Freq: Two times a day (BID) | ORAL | Status: DC
  Administered 2018-04-22 – 2018-04-24 (×4): 1 via ORAL
  Filled 2018-04-22 (×4): qty 1

## 2018-04-22 MED ORDER — LORATADINE 10 MG PO TABS
10.0000 mg | ORAL_TABLET | Freq: Every day | ORAL | Status: DC
  Administered 2018-04-23 – 2018-04-24 (×2): 10 mg via ORAL
  Filled 2018-04-22 (×2): qty 1

## 2018-04-22 MED ORDER — GUAIFENESIN 100 MG/5ML PO SOLN
200.0000 mg | Freq: Three times a day (TID) | ORAL | Status: DC | PRN
  Filled 2018-04-22 (×2): qty 10

## 2018-04-22 MED ORDER — VANCOMYCIN HCL IN DEXTROSE 1-5 GM/200ML-% IV SOLN
1000.0000 mg | INTRAVENOUS | Status: DC
  Administered 2018-04-24: 1000 mg via INTRAVENOUS
  Filled 2018-04-22 (×2): qty 200

## 2018-04-22 MED ORDER — PANTOPRAZOLE SODIUM 20 MG PO TBEC
20.0000 mg | DELAYED_RELEASE_TABLET | Freq: Every day | ORAL | Status: DC
  Administered 2018-04-23 – 2018-04-24 (×2): 20 mg via ORAL
  Filled 2018-04-22 (×2): qty 1

## 2018-04-22 MED ORDER — SODIUM CHLORIDE 0.9 % IV SOLN: 1.0000 g | INTRAVENOUS | Status: DC

## 2018-04-22 MED ORDER — DOCUSATE SODIUM 100 MG PO CAPS
100.0000 mg | ORAL_CAPSULE | Freq: Every day | ORAL | Status: DC | PRN
  Administered 2018-04-22: 100 mg via ORAL
  Filled 2018-04-22: qty 1

## 2018-04-22 MED ORDER — DEXTROSE 10 % IV SOLN
INTRAVENOUS | Status: DC
  Administered 2018-04-22: 17:00:00 via INTRAVENOUS

## 2018-04-22 MED ORDER — METHYLPREDNISOLONE SODIUM SUCC 125 MG IJ SOLR
125.0000 mg | Freq: Once | INTRAMUSCULAR | Status: AC
  Administered 2018-04-22: 125 mg via INTRAVENOUS
  Filled 2018-04-22: qty 2

## 2018-04-22 MED ORDER — ALBUTEROL SULFATE (2.5 MG/3ML) 0.083% IN NEBU
2.5000 mg | INHALATION_SOLUTION | Freq: Once | RESPIRATORY_TRACT | Status: AC
  Administered 2018-04-22: 2.5 mg via RESPIRATORY_TRACT
  Filled 2018-04-22: qty 3

## 2018-04-22 MED ORDER — VANCOMYCIN HCL IN DEXTROSE 1-5 GM/200ML-% IV SOLN
1000.0000 mg | INTRAVENOUS | Status: AC
  Administered 2018-04-23: 1000 mg via INTRAVENOUS
  Filled 2018-04-22: qty 200

## 2018-04-22 MED ORDER — CALCIUM ACETATE (PHOS BINDER) 667 MG PO CAPS
667.0000 mg | ORAL_CAPSULE | Freq: Every day | ORAL | Status: DC
  Administered 2018-04-23 – 2018-04-24 (×2): 667 mg via ORAL
  Filled 2018-04-22 (×2): qty 1

## 2018-04-22 MED ORDER — SODIUM CHLORIDE 0.9 % IV SOLN: 1.0000 g | Freq: Three times a day (TID) | INTRAVENOUS | Status: DC

## 2018-04-22 MED ORDER — FLUTICASONE FUROATE-VILANTEROL 200-25 MCG/INH IN AEPB
1.0000 | INHALATION_SPRAY | Freq: Every day | RESPIRATORY_TRACT | Status: DC
  Administered 2018-04-23 – 2018-04-24 (×2): 1 via RESPIRATORY_TRACT
  Filled 2018-04-22: qty 28

## 2018-04-22 MED ORDER — SODIUM CHLORIDE 0.9 % IV SOLN
1.0000 g | Freq: Once | INTRAVENOUS | Status: AC
  Administered 2018-04-22: 1 g via INTRAVENOUS
  Filled 2018-04-22: qty 1

## 2018-04-22 MED ORDER — SEVELAMER CARBONATE 800 MG PO TABS
800.0000 mg | ORAL_TABLET | Freq: Three times a day (TID) | ORAL | Status: DC
  Administered 2018-04-23 – 2018-04-24 (×4): 800 mg via ORAL
  Filled 2018-04-22 (×5): qty 1

## 2018-04-22 MED ORDER — SODIUM CHLORIDE 0.9 % IV SOLN: INTRAVENOUS | Status: DC | PRN

## 2018-04-22 MED ORDER — APIXABAN 5 MG PO TABS
5.0000 mg | ORAL_TABLET | Freq: Two times a day (BID) | ORAL | Status: DC
  Administered 2018-04-22 – 2018-04-24 (×4): 5 mg via ORAL
  Filled 2018-04-22 (×4): qty 1

## 2018-04-22 MED ORDER — PROMETHAZINE HCL 25 MG/ML IJ SOLN
12.5000 mg | Freq: Four times a day (QID) | INTRAMUSCULAR | Status: DC | PRN
  Administered 2018-04-22: 12.5 mg via INTRAVENOUS
  Filled 2018-04-22: qty 1

## 2018-04-22 MED ORDER — ACETAMINOPHEN 500 MG PO TABS: 500.0000 mg | ORAL_TABLET | Freq: Four times a day (QID) | ORAL | Status: DC | PRN

## 2018-04-22 MED ORDER — ATORVASTATIN CALCIUM 10 MG PO TABS
10.0000 mg | ORAL_TABLET | Freq: Every day | ORAL | Status: DC
  Administered 2018-04-22 – 2018-04-23 (×2): 10 mg via ORAL
  Filled 2018-04-22 (×2): qty 1

## 2018-04-22 MED ORDER — IPRATROPIUM-ALBUTEROL 0.5-2.5 (3) MG/3ML IN SOLN
3.0000 mL | Freq: Once | RESPIRATORY_TRACT | Status: AC
  Administered 2018-04-22: 3 mL via RESPIRATORY_TRACT
  Filled 2018-04-22: qty 3

## 2018-04-22 MED ORDER — CHLORHEXIDINE GLUCONATE CLOTH 2 % EX PADS
6.0000 | MEDICATED_PAD | Freq: Every day | CUTANEOUS | Status: DC
  Administered 2018-04-24: 6 via TOPICAL

## 2018-04-22 MED ORDER — SODIUM CHLORIDE 0.9 % IV SOLN
2.0000 g | Freq: Once | INTRAVENOUS | Status: DC
  Administered 2018-04-22: 2 g via INTRAVENOUS
  Filled 2018-04-22: qty 2

## 2018-04-22 MED ORDER — FLUTICASONE PROPIONATE 50 MCG/ACT NA SUSP
1.0000 | Freq: Every day | NASAL | Status: DC
  Administered 2018-04-23 – 2018-04-24 (×2): 1 via NASAL
  Filled 2018-04-22: qty 16

## 2018-04-22 MED ORDER — HYDROCORTISONE 2.5 % EX CREA
1.0000 "application " | TOPICAL_CREAM | Freq: Two times a day (BID) | CUTANEOUS | Status: DC | PRN
  Filled 2018-04-22: qty 30

## 2018-04-22 MED ORDER — ISOSORBIDE MONONITRATE ER 30 MG PO TB24
30.0000 mg | ORAL_TABLET | Freq: Every day | ORAL | Status: DC
  Administered 2018-04-23 – 2018-04-24 (×2): 30 mg via ORAL
  Filled 2018-04-22 (×3): qty 1

## 2018-04-22 MED ORDER — ALBUTEROL SULFATE (2.5 MG/3ML) 0.083% IN NEBU: 3.0000 mL | INHALATION_SOLUTION | Freq: Four times a day (QID) | RESPIRATORY_TRACT | Status: DC | PRN

## 2018-04-22 MED ORDER — ASPIRIN EC 325 MG PO TBEC
325.0000 mg | DELAYED_RELEASE_TABLET | Freq: Every day | ORAL | Status: DC
  Administered 2018-04-23 – 2018-04-24 (×2): 325 mg via ORAL
  Filled 2018-04-22 (×2): qty 1

## 2018-04-22 MED ORDER — IPRATROPIUM-ALBUTEROL 0.5-2.5 (3) MG/3ML IN SOLN: 3.0000 mL | Freq: Four times a day (QID) | RESPIRATORY_TRACT | Status: DC | PRN

## 2018-04-22 MED ORDER — ACETAMINOPHEN 80 MG RE SUPP: 500.0000 mg | Freq: Four times a day (QID) | RECTAL | Status: DC | PRN

## 2018-04-22 MED ORDER — MIDODRINE HCL 5 MG PO TABS
5.0000 mg | ORAL_TABLET | Freq: Three times a day (TID) | ORAL | Status: DC
  Administered 2018-04-23 – 2018-04-24 (×5): 5 mg via ORAL
  Filled 2018-04-22 (×7): qty 1

## 2018-04-22 MED ORDER — CARVEDILOL 6.25 MG PO TABS
6.2500 mg | ORAL_TABLET | Freq: Two times a day (BID) | ORAL | Status: DC
  Administered 2018-04-22 – 2018-04-23 (×2): 6.25 mg via ORAL
  Filled 2018-04-22 (×3): qty 1

## 2018-04-22 MED ORDER — ERYTHROMYCIN 5 MG/GM OP OINT
1.0000 "application " | TOPICAL_OINTMENT | Freq: Every day | OPHTHALMIC | Status: DC
  Administered 2018-04-22: 1 via OPHTHALMIC
  Filled 2018-04-22: qty 1

## 2018-04-22 MED ORDER — SODIUM CHLORIDE 0.9 % IV SOLN
1.0000 g | INTRAVENOUS | Status: DC
  Filled 2018-04-22: qty 1

## 2018-04-22 MED ORDER — DEXTROSE 50 % IV SOLN
INTRAVENOUS | Status: AC
  Filled 2018-04-22: qty 50

## 2018-04-22 NOTE — ED Notes (Signed)
Report given to [place] RN patient off untit o floor.

## 2018-04-22 NOTE — Progress Notes (Addendum)
Verbal order from Dr. Candiss Norse for vancomycin to be given during dialysis. Vancomycin IVPB was given to dialysis nurse, Freida Busman upon transport. Consent for dialysis was given by pt.'s sister [name], pt is unable to sign consent due to being lethargic. The pt.'s POA, christie Graves, daughter was not available when consent was needed due to it being an emergency. POA verbalized to RN once she was able to be reached, that it was ok for pt.'s sister to give consent for emergency dialysis.   Aleeha Boline [employer]

## 2018-04-22 NOTE — H&P (Signed)
[facility] NAME: [name]    MR#:  [id number]  DATE OF BIRTH:  1956-04-11  DATE OF ADMISSION:  04/22/2018  PRIMARY CARE PHYSICIAN: Donnie Coffin, MD   REQUESTING/REFERRING PHYSICIAN:   CHIEF COMPLAINT:   Chief Complaint  Patient presents with  . Shortness of Breath    HISTORY OF PRESENT ILLNESS: [name]  is a 62 y.o. male with a known history per below presenting from local nursing home with complaint of shortness of breath while receiving dialysis earlier today, patient recently diagnosed with pneumonia-was on antibiotics per ED attending, noted to be satting 82% on room air while at dialysis, typically on 2 L chronically, had episode of nausea and vomiting, in the emergency room chest x-ray noted for possible left pneumonia as well as congestive heart failure, creatinine 7.2, hospitalist asked to admit/treat, patient evaluated in the emergency room, no apparent distress, resting comfortably in bed, patient now be admitted for acute H CAP, acute on chronic hypoxic respiratory failure, acute on chronic diastolic congestive heart failure due to fluid overload state from hemodialysis.  PAST MEDICAL HISTORY:   Past Medical History:  Diagnosis Date  . 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 Amputee, below knee, left ([place])   . Anemia   . Asthma   . Cardiomyopathy ([place])   . CHF (congestive heart failure) ([place])   . Chronic systolic heart failure ([place])   . Complication of anesthesia    hypotension  . COPD (chronic obstructive pulmonary disease) ([place])   . Coronary artery disease   . Dialysis patient ([place])    Mon, Wed, Fri  . End stage renal disease ([place])   . GERD (gastroesophageal reflux disease)   . Headache   . History of kidney stones   . History of pulmonary embolism   . HLD (hyperlipidemia)   . HTN (hypertension)   . Hyperparathyroidism   . Myocardial infarction ([place])   . Peripheral vascular disease  ([place])   . Shortness of breath dyspnea   . Sleep apnea    NO C-PAP, Patient stated in process of  "getting one"   . Tobacco dependence     PAST SURGICAL HISTORY:  Past Surgical History:  Procedure Laterality Date  . A/V FISTULAGRAM Left 08/14/2017   Procedure: A/V FISTULAGRAM;  Surgeon: Algernon Huxley, MD;  Location: [facility];  Service: Cardiovascular;  Laterality: Left;  . AMPUTATION Left 05/06/2014   Procedure: AMPUTATION BELOW KNEE;  Surgeon: Elam Dutch, MD;  Location: [facility];  Service: Vascular;  Laterality: Left;  . AMPUTATION Right 01/12/2015   Procedure: Foot transmetatarsal amputation;  Surgeon: Algernon Huxley, MD;  Location: [facility];  Service: Vascular;  Laterality: Right;  . APPLICATION OF WOUND VAC Right 03/01/2015   Procedure: Application of Bio-connekt graft and wound vac application to right foot ;  Surgeon: Algernon Huxley, MD;  Location: [facility];  Service: Vascular;  Laterality: Right;  . AV FISTULA PLACEMENT Left   . AV FISTULA PLACEMENT Left 11/28/2016   Procedure: ARTERIOVENOUS (AV) FISTULA CREATION;  Surgeon: Algernon Huxley, MD;  Location: [facility];  Service: Vascular;  Laterality: Left;  . CARDIAC CATHETERIZATION     stent placement   . CORONARY ANGIOPLASTY    . DIALYSIS/PERMA CATHETER INSERTION N/A 07/31/2016   Procedure: Dialysis/Perma Catheter Insertion;  Surgeon: Algernon Huxley, MD;  Location: [facility];  Service: Cardiovascular;  Laterality:  N/A;  . DIALYSIS/PERMA CATHETER INSERTION N/A 07/25/2017   Procedure: DIALYSIS/PERMA CATHETER INSERTION and fistulagram;  Surgeon: Algernon Huxley, MD;  Location: [facility];  Service: Cardiovascular;  Laterality: N/A;  . DIALYSIS/PERMA CATHETER REMOVAL N/A 07/22/2017   Procedure: DIALYSIS/PERMA CATHETER REMOVAL;  Surgeon: Katha Cabal, MD;  Location: [facility];  Service: Cardiovascular;  Laterality: N/A;  . DIALYSIS/PERMA CATHETER REMOVAL N/A 02/05/2018   Procedure: DIALYSIS/PERMA CATHETER  REMOVAL;  Surgeon: Algernon Huxley, MD;  Location: [facility];  Service: Cardiovascular;  Laterality: N/A;  . IR FLUORO GUIDE CV LINE LEFT  04/10/2017  . LIGATION OF ARTERIOVENOUS  FISTULA Right 01/31/2016   Procedure: LIGATION OF ARTERIOVENOUS  FISTULA;  Surgeon: Algernon Huxley, MD;  Location: [facility];  Service: Vascular;  Laterality: Right;  . PERIPHERAL VASCULAR CATHETERIZATION Right 12/15/2014   Procedure: Lower Extremity Angiography;  Surgeon: Algernon Huxley, MD;  Location: [facility];  Service: Cardiovascular;  Laterality: Right;  . PERIPHERAL VASCULAR CATHETERIZATION  12/15/2014   Procedure: Lower Extremity Intervention;  Surgeon: Algernon Huxley, MD;  Location: [facility];  Service: Cardiovascular;;  . PERIPHERAL VASCULAR CATHETERIZATION Right 08/14/2015   Procedure: A/V Shuntogram/Fistulagram;  Surgeon: Algernon Huxley, MD;  Location: [facility];  Service: Cardiovascular;  Laterality: Right;  . PERIPHERAL VASCULAR CATHETERIZATION N/A 08/14/2015   Procedure: A/V Shunt Intervention;  Surgeon: Algernon Huxley, MD;  Location: [facility];  Service: Cardiovascular;  Laterality: N/A;  . PERIPHERAL VASCULAR CATHETERIZATION N/A 01/11/2016   Procedure: Dialysis/Perma Catheter Insertion;  Surgeon: Algernon Huxley, MD;  Location: [facility];  Service: Cardiovascular;  Laterality: N/A;  . REVISON OF ARTERIOVENOUS FISTULA Right 02/17/2016   Procedure: removal of AV fistula;  Surgeon: Serafina [place], MD;  Location: [facility];  Service: Vascular;  Laterality: Right;  . REVISON OF ARTERIOVENOUS FISTULA Right 01/31/2016   Procedure: REVISON OF ARTERIOVENOUS FISTULA ( BRACHIOCEPHALIC ) W/ ARTEGRAFT;  Surgeon: Algernon Huxley, MD;  Location: [facility];  Service: Vascular;  Laterality: Right;  . TRANSMETATARSAL AMPUTATION Right 05/04/2015   Procedure: TRANSMETATARSAL AMPUTATION REVISION, great toe amputation;  Surgeon: Algernon Huxley, MD;  Location: [facility];  Service: Vascular;  Laterality:  Right;    SOCIAL HISTORY:  Social History   Tobacco Use  . Smoking status: Light Tobacco Smoker    Packs/day: 0.25    Years: 30.00    Pack years: 7.50    Types: Cigarettes  . Smokeless tobacco: Never Used  . Tobacco comment: 5  Substance Use Topics  . Alcohol use: No    Alcohol/week: 0.0 standard drinks    FAMILY HISTORY:  Family History  Problem Relation Age of Onset  . Leukemia Mother   . Heart attack Father   . Heart failure Other   . Hypertension Other   . Leukemia Other   . Diabetes Other   . Prostate cancer Neg Hx   . Kidney cancer Neg Hx   . Bladder Cancer Neg Hx     DRUG ALLERGIES:  Allergies  Allergen Reactions  . Dust Mite Extract Other (See Comments)    Reaction: unknown    REVIEW OF SYSTEMS:   CONSTITUTIONAL: No fever, fatigue or weakness.  EYES: No blurred or double vision.  EARS, NOSE, AND THROAT: No tinnitus or ear pain.  RESPIRATORY:+ cough, shortness of breath, wheezing   CARDIOVASCULAR: No chest pain, orthopnea, edema.  GASTROINTESTINAL: No nausea, vomiting, diarrhea or  abdominal pain.  GENITOURINARY: No dysuria, hematuria.  ENDOCRINE: No polyuria, nocturia,  HEMATOLOGY: No anemia, easy bruising or bleeding SKIN: No rash or lesion. MUSCULOSKELETAL: No joint pain or arthritis.   NEUROLOGIC: No tingling, numbness, weakness.  PSYCHIATRY: No anxiety or depression.   MEDICATIONS AT HOME:  Prior to Admission medications   Medication Sig Start Date End Date Taking? Authorizing Provider  acetaminophen (TYLENOL) 500 MG tablet Take 500 mg by mouth every 6 (six) hours as needed for mild pain.    Yes [provider]  albuterol (PROVENTIL HFA;VENTOLIN HFA) 108 (90 Base) MCG/ACT inhaler Inhale 2 puffs into the lungs every 6 (six) hours as needed for wheezing or shortness of breath.    Yes [provider]  apixaban (ELIQUIS) 5 MG TABS tablet Take 1 tablet (5 mg total) by mouth 2 (two) times daily. 01/20/17  Yes Gladstone Lighter, MD   aspirin EC 325 MG tablet Take 325 mg by mouth daily.    Yes [provider]  atorvastatin (LIPITOR) 10 MG tablet Take 10 mg by mouth at bedtime.    Yes [provider]  budesonide-formoterol (SYMBICORT) 160-4.5 MCG/ACT inhaler Inhale 2 puffs into the lungs 2 (two) times daily.   Yes [provider]  calcium acetate (PHOSLO) 667 MG capsule Take 667 mg by mouth daily.    Yes [provider]  carvedilol (COREG) 6.25 MG tablet Take 1 tablet (6.25 mg total) by mouth 2 (two) times daily. 10/07/17  Yes Hackney, Otila Kluver A, FNP  cetirizine (ZYRTEC) 10 MG tablet Take 10 mg by mouth daily.   Yes [provider]  docusate sodium (COLACE) 100 MG capsule Take 100 mg by mouth daily as needed for mild constipation or moderate constipation.    Yes [provider]  erythromycin ophthalmic ointment Place 1 application into the right eye at bedtime.   Yes [provider]  fluticasone (FLONASE) 50 MCG/ACT nasal spray Place 1 spray into both nostrils daily.    Yes [provider]  guaifenesin (ROBAFEN) 100 MG/5ML syrup Take 200 mg by mouth 3 (three) times daily as needed for cough.   Yes [provider]  hydrocortisone 2.5 % cream Apply 1 application topically 2 (two) times daily as needed (itching).    Yes [provider]  Ipratropium-Albuterol (COMBIVENT RESPIMAT) 20-100 MCG/ACT AERS respimat Inhale 1 puff into the lungs every 6 (six) hours as needed for wheezing or shortness of breath.   Yes [provider]  isosorbide mononitrate (IMDUR) 30 MG 24 hr tablet Take 30 mg by mouth daily.  08/22/16  Yes [provider]  midodrine (PROAMATINE) 10 MG tablet Take 5 mg by mouth daily as needed.    Yes [provider]  Multiple Vitamins-Minerals (THERA-M PO) Take 1 tablet by mouth 2 (two) times daily.   Yes [provider]  nitroGLYCERIN (NITROSTAT) 0.4 MG SL tablet Place 0.4 mg under the tongue every 5  (five) minutes as needed for chest pain.   Yes [provider]  pantoprazole (PROTONIX) 20 MG tablet Take 20 mg by mouth daily.   Yes [provider]  polyethylene glycol (MIRALAX / GLYCOLAX) packet Take 17 g by mouth daily as needed for mild constipation.    Yes [provider]  pregabalin (LYRICA) 75 MG capsule Take 1 capsule (75 mg total) by mouth daily. 01/22/17  Yes Gladstone Lighter, MD  sevelamer carbonate (RENVELA) 800 MG tablet Take 800 mg by mouth 3 (three) times daily with meals.  Yes [provider]  tiotropium (SPIRIVA) 18 MCG inhalation capsule Place 18 mcg into inhaler and inhale daily.   Yes [provider]      PHYSICAL EXAMINATION:   VITAL SIGNS: Blood pressure 117/83, pulse 92, temperature (!) 100.8 F (38.2 C), temperature source Oral, resp. rate (!) 23, height 6\' 2"  (1.88 m), weight 110 kg, SpO2 95 %.  GENERAL:  62 y.o.-year-old patient lying in the bed with no acute distress. + Obese EYES: Pupils equal, round, reactive to light and accommodation. No scleral icterus. Extraocular muscles intact.  HEENT: Head atraumatic, normocephalic. Oropharynx and nasopharynx clear.  NECK:  Supple, no jugular venous distention. No thyroid enlargement, no tenderness.  LUNGS: Rales/diminished breath sounds throughout. No use of accessory muscles of respiration.  CARDIOVASCULAR: S1, S2 normal. No murmurs, rubs, or gallops.  ABDOMEN: Soft, nontender, nondistended. Bowel sounds present. No organomegaly or mass.  EXTREMITIES: Bilateral lower extremity edema   NEUROLOGIC: Cranial nerves II through XII are intact. Muscle strength 5/5 in all extremities. Sensation intact. Gait not checked.  PSYCHIATRIC: The patient is alert and oriented x 3.  SKIN: No obvious rash, lesion, or ulcer.   LABORATORY PANEL:   CBC Recent Labs  Lab 04/22/18 1216  WBC 7.2  HGB 12.1*  HCT 37.7*  PLT 174  MCV 96.4  MCH 30.9  MCHC 32.1  RDW 15.2  LYMPHSABS 1.2   MONOABS 0.9  EOSABS 0.2  BASOSABS 0.0   ------------------------------------------------------------------------------------------------------------------  Chemistries  Recent Labs  Lab 04/22/18 1216  NA 137  K 3.9  CL 96*  CO2 31  GLUCOSE 84  BUN 44*  CREATININE 7.21*  CALCIUM 9.1  AST 40  ALT 32  ALKPHOS 144*  BILITOT 0.9   ------------------------------------------------------------------------------------------------------------------ estimated creatinine clearance is 13.7 mL/min (A) (by C-G formula based on SCr of 7.21 mg/dL (H)). ------------------------------------------------------------------------------------------------------------------ No results for input(s): TSH, T4TOTAL, T3FREE, THYROIDAB in the last 72 hours.  Invalid input(s): FREET3   Coagulation profile No results for input(s): INR, PROTIME in the last 168 hours. ------------------------------------------------------------------------------------------------------------------- No results for input(s): DDIMER in the last 72 hours. -------------------------------------------------------------------------------------------------------------------  Cardiac Enzymes No results for input(s): CKMB, TROPONINI, MYOGLOBIN in the last 168 hours.  Invalid input(s): CK ------------------------------------------------------------------------------------------------------------------ Invalid input(s): POCBNP  ---------------------------------------------------------------------------------------------------------------  Urinalysis    Component Value Date/Time   COLORURINE Yellow 07/06/2013 1727   APPEARANCEUR Hazy 07/06/2013 1727   LABSPEC 1.017 07/06/2013 1727   PHURINE 6.0 07/06/2013 1727   GLUCOSEU 50 mg/dL 07/06/2013 1727   HGBUR 1+ 07/06/2013 1727   BILIRUBINUR Negative 07/06/2013 1727   KETONESUR Negative 07/06/2013 1727   PROTEINUR >=500 07/06/2013 1727   NITRITE Negative 07/06/2013 1727    LEUKOCYTESUR 1+ 07/06/2013 1727     RADIOLOGY: Dg Chest 2 View  Result Date: 04/22/2018 CLINICAL DATA:  complains of shortness of breath EXAM: CHEST - 2 VIEW COMPARISON:  12/21/2017 FINDINGS: There is marked cardiac enlargement. Aortic atherosclerosis noted. Small pleural effusions and mild interstitial edema identified. Diminished aeration to the left lung base is identified which may represent pneumonia. IMPRESSION: 1. Left base opacity which may reflect pneumonia. 2. Cardiac enlargement, bilateral pleural effusions and mild edema consistent with CHF. 3.  Aortic Atherosclerosis (ICD10-I70.0). Electronically Signed   By: Kerby Moors M.D.   On: 04/22/2018 12:56    EKG: Orders placed or performed during the hospital encounter of 12/21/17  . ED EKG 12-Lead  . ED EKG 12-Lead    IMPRESSION AND PLAN: *Acute H CAP *Acute on chronic hypoxic respiratory failure *  Acute on chronic diastolic congestive heart failure exacerbation *Acute fluid overload state *Chronic end-stage renal disease  Admit to regular nursing for bed, pneumonia protocol, empiric cefepime/vancomycin, follow-up on cultures, supplemental oxygen wean as tolerated, aggressive pulmonary toilet with bronchodilator therapy, nephrology consulted for hemodialysis needs, and continue close medical monitoring  All the records are reviewed and case discussed with ED provider. Management plans discussed with the patient, family and they are in agreement.  CODE STATUS:full    Code Status Orders  (From admission, onward)         Start     Ordered   04/22/18 1536  Full code  Continuous     04/22/18 1535        Code Status History    Date Active Date Inactive Code Status Order ID Comments User Context   12/21/2017 1105 12/23/2017 1713 Full Code [id number]  Henreitta Leber, MD Inpatient   09/18/2017 1335 09/19/2017 2041 Full Code [id number]  Bettey Costa, MD Inpatient   07/21/2017 1610 07/26/2017 0039 Full Code [id number]  Bettey Costa, MD Inpatient   06/22/2017 2145 06/23/2017 1805 Full Code [id number]  Henreitta Leber, MD Inpatient   04/07/2017 0937 04/10/2017 1828 Full Code [id number]  Eloise Levels, MD ED   01/19/2017 0145 01/21/2017 1941 Full Code [id number]  Lance Coon, MD Inpatient   11/11/2016 0421 11/14/2016 1908 Full Code [id number]  Harrie Foreman, MD Inpatient   10/28/2016 0756 10/30/2016 1930 Full Code [id number]  Demetrios Loll, MD ED   08/21/2016 0408 08/22/2016 1826 DNR [id number]  Harrie Foreman, MD Inpatient   08/03/2016 2319 08/07/2016 1928 DNR [id number]  Lance Coon, MD Inpatient   07/26/2016 1707 07/31/2016 2158 DNR [id number]  Vaughan Basta, MD Inpatient   05/22/2016 0034 05/24/2016 1911 Full Code [id number]  Harvie Bridge, DO Inpatient   01/13/2015 0144 01/14/2015 1446 Full Code [id number]  Juluis Mire, MD ED   12/15/2014 0932 12/15/2014 1431 Full Code [id number]  Algernon Huxley, MD Inpatient   05/06/2014 2029 05/10/2014 1723 Full Code [id number]  Elam Dutch, MD Inpatient   05/04/2014 2224 05/06/2014 2029 Full Code [id number]  Rise Patience, MD Inpatient       TOTAL TIME TAKING CARE OF THIS PATIENT: 45 minutes.    Avel Peace Chenoa Luddy M.D on 04/22/2018   Between 7am to 6pm - Pager - [phone]  After 6pm go to [email or web address] - password [email or web address] [facility]  Office  [phone]  CC: Primary care physician; Donnie Coffin, MD   Note: This dictation was prepared with Dragon dictation along with smaller phrase technology. Any transcriptional errors that result from this process are unintentional.

## 2018-04-22 NOTE — Consult Note (Signed)
Pharmacy Antibiotic Note   [name] is a 62 y.o. male admitted on 04/22/2018 with pneumonia.  Pharmacy has been consulted for vancomycin and cefepime dosing. Hepresented from dialysis today with N/V. Was recently started on azithromycin for PNA outpatient and on 2L [place] here with sats in the 90s.    Plan: 1) Will give Vancomycin 1000mg  IV x 1 now for a total of Vancomycin 2000mg  LD. Then start Vancomycin 1000mg  IV w/ HD every M-W-F. Vanc level before 3rd HD.  Target Pre-HD level trough 15-25 mcg/mL. He will be scheduled for an additional HD session tomorrow based.  2) Start Cefepime 1g IV every 24 hours. A total of 3 grams were administered in the ED. I have scheduled the first dose following the 2nd HD session here on 11/29  Height: 6\' 2"  (188 cm) Weight: 242 lb 8.1 oz (110 kg) IBW/kg (Calculated) : 82.2  Temp (24hrs), Avg:98.8 F (37.1 C), Min:98.8 F (37.1 C), Max:98.8 F (37.1 C)  Recent Labs  Lab 04/22/18 1216  WBC 7.2  CREATININE 7.21*    Estimated Creatinine Clearance: 13.7 mL/min (A) (by C-G formula based on SCr of 7.21 mg/dL (H)).    Allergies  Allergen Reactions  . Dust Mite Extract Other (See Comments)    Reaction: unknown    Antimicrobials this admission: 11/27 Vancomycin >>  11/27 Cefepime >>   Microbiology results: 11/27  BCx: pending  11/27 MRSA PCR: pending   Thank you for allowing pharmacy to be a part of this patient's care.  Dallie Piles, PharmD 04/22/2018 1:48 PM

## 2018-04-22 NOTE — ED Notes (Signed)
Awaiting plan of care Md at bedside.

## 2018-04-22 NOTE — ED Notes (Signed)
Patient very difficult stick. 1 set of blood culture obtained from right wrist.

## 2018-04-22 NOTE — ED Notes (Signed)
Flu swab obtained and sent to lab

## 2018-04-22 NOTE — Progress Notes (Signed)
[name], [place] 04/22/18  Subjective:   Patient known to our practice from previous admissions.  He was sent over from dialysis for hypoxia and shortness of breath Admitted for further evaluation and management.  Chest x-ray suggests mild fluid overload and pneumonia.  Currently requiring oxygen supplementation On the floor, rapid response was called for patient feeling acutely short of breath and making upper respiratory sounds.  Oxygen saturation 94%   Objective:  Vital signs in last 24 hours:  Temp:  [98.8 F (37.1 C)-100.8 F (38.2 C)] 100.8 F (38.2 C) (11/27 1652) Pulse Rate:  [84-92] 92 (11/27 1652) Resp:  [2-34] 23 (11/27 1540) BP: (107-124)/(66-83) 117/83 (11/27 1652) SpO2:  [95 %-98 %] 95 % (11/27 1652) Weight:  [110 kg] 110 kg (11/27 1202)  Weight change:  Filed Weights   04/22/18 1202  Weight: 110 kg    Intake/Output:    Intake/Output Summary (Last 24 hours) at 04/22/2018 1657 Last data filed at 04/22/2018 1431 Gross per 24 hour  Intake 200 ml  Output -  Net 200 ml     Physical Exam: General:  Chronically ill-appearing, lying in the bed  HEENT  tongue appears slightly enlarged, making grunting sounds, oxygen supplementation with nasal cannula  Neck  supple  Pulm/lungs  mild scattered rhonchi, mild crackles at bases  CVS/Heart  regular, tachycardic  Abdomen:   Soft, nontender  Extremities:  No peripheral edema  Neurologic:  Alert, able to follow commands  Skin:  No acute rashes  Access:  Left upper arm AV fistula       Basic Metabolic Panel:  Recent Labs  Lab 04/22/18 1216  NA 137  K 3.9  CL 96*  CO2 31  GLUCOSE 84  BUN 44*  CREATININE 7.21*  CALCIUM 9.1     CBC: Recent Labs  Lab 04/22/18 1216  WBC 7.2  NEUTROABS 4.7  HGB 12.1*  HCT 37.7*  MCV 96.4  PLT 174      Lab Results  Component Value Date   HEPBSAG Negative 09/18/2017   HEPBSAB Non Reactive 05/22/2016      Microbiology:  No  results found for this or any previous visit (from the past 240 hour(s)).  Coagulation Studies: No results for input(s): LABPROT, INR in the last 72 hours.  Urinalysis: No results for input(s): COLORURINE, LABSPEC, PHURINE, GLUCOSEU, HGBUR, BILIRUBINUR, KETONESUR, PROTEINUR, UROBILINOGEN, NITRITE, LEUKOCYTESUR in the last 72 hours.  Invalid input(s): APPERANCEUR    Imaging: Dg Chest 2 View  Result Date: 04/22/2018 CLINICAL DATA:  complains of shortness of breath EXAM: CHEST - 2 VIEW COMPARISON:  12/21/2017 FINDINGS: There is marked cardiac enlargement. Aortic atherosclerosis noted. Small pleural effusions and mild interstitial edema identified. Diminished aeration to the left lung base is identified which may represent pneumonia. IMPRESSION: 1. Left base opacity which may reflect pneumonia. 2. Cardiac enlargement, bilateral pleural effusions and mild edema consistent with CHF. 3.  Aortic Atherosclerosis (ICD10-I70.0). Electronically Signed   By: Kerby Moors M.D.   On: 04/22/2018 12:56     Medications:   . sodium chloride    . [START ON 04/24/2018] ceFEPime (MAXIPIME) IV    . dextrose    . vancomycin    . [START ON 04/23/2018] vancomycin    . [START ON 04/23/2018] vancomycin     . apixaban  5 mg Oral BID  . [START ON 04/23/2018] aspirin EC  325 mg Oral Daily  . atorvastatin  10 mg Oral QHS  . [START ON  04/23/2018] calcium acetate  667 mg Oral Daily  . carvedilol  6.25 mg Oral BID  . dextrose      . erythromycin  1 application Right Eye QHS  . [START ON 04/23/2018] fluticasone  1 spray Each Nare Daily  . [START ON 04/23/2018] fluticasone furoate-vilanterol  1 puff Inhalation Daily  . [START ON 04/23/2018] isosorbide mononitrate  30 mg Oral Daily  . [START ON 04/23/2018] loratadine  10 mg Oral Daily  . midodrine  5 mg Oral TID WC  . multivitamin with minerals  1 tablet Oral BID  . [START ON 04/23/2018] pantoprazole  20 mg Oral Daily  . pregabalin  75 mg Oral Daily  .  sevelamer carbonate  800 mg Oral TID WC  . tiotropium  18 mcg Inhalation Daily   sodium chloride, acetaminophen, albuterol, docusate sodium, guaiFENesin, hydrocortisone, ipratropium-albuterol, lidocaine, nitroGLYCERIN, polyethylene glycol, promethazine  Assessment/ Plan:  62 y.o. male with ESRD on hemodialysis, hypertension, hyperlipidemia, anemia, COPD, peripheral vascular disease, left BKA, right toe amputations  MWF [name].   1.  End-stage renal disease 2.  Shortness of breath 3.  Anemia of chronic kidney disease 4.  Chronic hypotension 5.  Secondary hyperparathyroidism  Patient dialyzed today 2 hours 45 minutes as outpatient and about 2 L of fluid was removed.  He started getting short of breath after eating some cookies.  On the floor he is currently making grunting sounds which seems to be upper airway related.  He is currently on 2 L oxygen by nasal cannula.  Rapid response was called but canceled after oxygen saturation was found to be 94%.  Fingerstick blood sugar 66.  Patient started on D10 at 50 cc/h.  Patient is alert and able to follow commands.  His chest x-ray shows left basilar opacity suggesting pneumonia.  He also has cardiac enlargement with bilateral pleural effusions and mild edema consistent with CHF.   Plan: Urgent hemodialysis to remove 1 to 1.5 L of fluid Internal medicine team is planning for CT of neck     LOS: 0 Jiyah Torpey Candiss Norse 11/27/20194:57 PM  [place], [place]  Note: This note was prepared with Dragon dictation. Any transcription errors are unintentional

## 2018-04-22 NOTE — ED Notes (Signed)
ABX started prior to start of antibiotics. Admitting Dr. Holly Bodily. S. Made aware. Ok to draw now and send.

## 2018-04-22 NOTE — Progress Notes (Signed)
Pt refuses to have nursing staff help him out of his street clothes into a hospital gown. He is also refusing RN to swab his nose for MRSA and to assess his bottom area at this time. Admission questions were completed by his POA- daughterEstrella Deeds- [phone].  Rivkah Wolz [employer]

## 2018-04-22 NOTE — Progress Notes (Addendum)
Rapid response called at 1645 due to pt being SOB and having difficulty breathing, pt is currently on 2L at 91-94%, VSS. CBG 66. Pt is unable to talk and express how he feels due to all the  excessive coughing. When he does talk it is hard to understand him due to all the extra secretions in his upper airway. Pt unable to cough up secretions. Dr. Jerelyn Charles at bedside and stated that pt is stable and the rapid response team can leave. Pt is lethargic and responds to voice and oriented to self and place at this time. Orders placed by MD. Dr. Candiss Norse also at bedside, verbal order to start pt on D10% at 81ml/hr. RN transported with pt to x-ray and to dialysis. Report given to dialysis nurse, Freida Busman.  Shambhavi Salley [employer]

## 2018-04-22 NOTE — ED Provider Notes (Signed)
[facility] Emergency Department Provider Note    First MD Initiated Contact with Patient 04/22/18 1208     (approximate)  I have reviewed the triage vital signs and the nursing notes.   HISTORY  Chief Complaint Shortness of Breath    HPI [name] is a 62 y.o. male  presents from dialysis with N/V.  Started midway through dialysis session.  Was recently started on abx for "pna" but cant recall what medication.  Denies any abdomiunal pain.  Does where 2L [facility] at home.  Found in dialysis to be hypoxic to low 80's on RA but retured to high 90's on 2L.     Past Medical History:  Diagnosis Date  . 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 Amputee, below knee, left ([place])   . Anemia   . Asthma   . Cardiomyopathy ([place])   . CHF (congestive heart failure) ([place])   . Chronic systolic heart failure ([place])   . Complication of anesthesia    hypotension  . COPD (chronic obstructive pulmonary disease) ([place])   . Coronary artery disease   . Dialysis patient ([place])    Mon, Wed, Fri  . End stage renal disease ([place])   . GERD (gastroesophageal reflux disease)   . Headache   . History of kidney stones   . History of pulmonary embolism   . HLD (hyperlipidemia)   . HTN (hypertension)   . Hyperparathyroidism   . Myocardial infarction ([place])   . Peripheral vascular disease ([place])   . Shortness of breath dyspnea   . Sleep apnea    NO C-PAP, Patient stated in process of  "getting one"   . Tobacco dependence    Family History  Problem Relation Age of Onset  . Leukemia Mother   . Heart attack Father   . Heart failure Other   . Hypertension Other   . Leukemia Other   . Diabetes Other   . Prostate cancer Neg Hx   . Kidney cancer Neg Hx   . Bladder Cancer Neg Hx    Past Surgical History:  Procedure Laterality Date  . A/V FISTULAGRAM Left 08/14/2017   Procedure: A/V FISTULAGRAM;  Surgeon: Algernon Huxley, MD;  Location: [place] [facility];  Service:  Cardiovascular;  Laterality: Left;  . AMPUTATION Left 05/06/2014   Procedure: AMPUTATION BELOW KNEE;  Surgeon: Elam Dutch, MD;  Location: [place];  Service: Vascular;  Laterality: Left;  . AMPUTATION Right 01/12/2015   Procedure: Foot transmetatarsal amputation;  Surgeon: Algernon Huxley, MD;  Location: [facility];  Service: Vascular;  Laterality: Right;  . APPLICATION OF WOUND VAC Right 03/01/2015   Procedure: Application of Bio-connekt graft and wound vac application to right foot ;  Surgeon: Algernon Huxley, MD;  Location: [facility];  Service: Vascular;  Laterality: Right;  . AV FISTULA PLACEMENT Left   . AV FISTULA PLACEMENT Left 11/28/2016   Procedure: ARTERIOVENOUS (AV) FISTULA CREATION;  Surgeon: Algernon Huxley, MD;  Location: [facility];  Service: Vascular;  Laterality: Left;  . CARDIAC CATHETERIZATION     stent placement   . CORONARY ANGIOPLASTY    . DIALYSIS/PERMA CATHETER INSERTION N/A 07/31/2016   Procedure: Dialysis/Perma Catheter Insertion;  Surgeon: Algernon Huxley, MD;  Location: [place] [facility];  Service: Cardiovascular;  Laterality: N/A;  . DIALYSIS/PERMA CATHETER INSERTION N/A 07/25/2017   Procedure: DIALYSIS/PERMA CATHETER INSERTION and fistulagram;  Surgeon: Algernon Huxley, MD;  Location:  [place] [facility];  Service: Cardiovascular;  Laterality: N/A;  . DIALYSIS/PERMA CATHETER REMOVAL N/A 07/22/2017   Procedure: DIALYSIS/PERMA CATHETER REMOVAL;  Surgeon: Katha Cabal, MD;  Location: [place] [facility];  Service: Cardiovascular;  Laterality: N/A;  . DIALYSIS/PERMA CATHETER REMOVAL N/A 02/05/2018   Procedure: DIALYSIS/PERMA CATHETER REMOVAL;  Surgeon: Algernon Huxley, MD;  Location: [place] [facility];  Service: Cardiovascular;  Laterality: N/A;  . IR FLUORO GUIDE CV LINE LEFT  04/10/2017  . LIGATION OF ARTERIOVENOUS  FISTULA Right 01/31/2016   Procedure: LIGATION OF ARTERIOVENOUS  FISTULA;  Surgeon: Algernon Huxley, MD;  Location: [facility];  Service: Vascular;  Laterality: Right;  .  PERIPHERAL VASCULAR CATHETERIZATION Right 12/15/2014   Procedure: Lower Extremity Angiography;  Surgeon: Algernon Huxley, MD;  Location: [place] [facility];  Service: Cardiovascular;  Laterality: Right;  . PERIPHERAL VASCULAR CATHETERIZATION  12/15/2014   Procedure: Lower Extremity Intervention;  Surgeon: Algernon Huxley, MD;  Location: [place] [facility];  Service: Cardiovascular;;  . PERIPHERAL VASCULAR CATHETERIZATION Right 08/14/2015   Procedure: A/V Shuntogram/Fistulagram;  Surgeon: Algernon Huxley, MD;  Location: [place] [facility];  Service: Cardiovascular;  Laterality: Right;  . PERIPHERAL VASCULAR CATHETERIZATION N/A 08/14/2015   Procedure: A/V Shunt Intervention;  Surgeon: Algernon Huxley, MD;  Location: [place] [facility];  Service: Cardiovascular;  Laterality: N/A;  . PERIPHERAL VASCULAR CATHETERIZATION N/A 01/11/2016   Procedure: Dialysis/Perma Catheter Insertion;  Surgeon: Algernon Huxley, MD;  Location: [place] [facility];  Service: Cardiovascular;  Laterality: N/A;  . REVISON OF ARTERIOVENOUS FISTULA Right 02/17/2016   Procedure: removal of AV fistula;  Surgeon: Serafina Mitchell, MD;  Location: [facility];  Service: Vascular;  Laterality: Right;  . REVISON OF ARTERIOVENOUS FISTULA Right 01/31/2016   Procedure: REVISON OF ARTERIOVENOUS FISTULA ( BRACHIOCEPHALIC ) W/ ARTEGRAFT;  Surgeon: Algernon Huxley, MD;  Location: [facility];  Service: Vascular;  Laterality: Right;  . TRANSMETATARSAL AMPUTATION Right 05/04/2015   Procedure: TRANSMETATARSAL AMPUTATION REVISION, great toe amputation;  Surgeon: Algernon Huxley, MD;  Location: [facility];  Service: Vascular;  Laterality: Right;   Patient Active Problem List   Diagnosis Date Noted  . Acute on chronic respiratory failure with hypoxia ([place]) 12/21/2017  . Tobacco use 10/07/2017  . Apnea   . End-stage renal disease on hemodialysis ([place])   . Hypervolemia   . Altered mental status   . Acute on chronic systolic CHF (congestive heart failure) ([place]) 01/18/2017  .  Pulmonary embolism ([place]) 08/03/2016  . NSTEMI (non-ST elevated myocardial infarction) ([place]) 07/26/2016  . PVD (peripheral vascular disease) ([place]) 03/05/2016  . Preop examination 05/02/2015  . Chronic systolic heart failure ([place])   . Hyperkalemia 01/13/2015  . Normocytic anemia 05/04/2014  . Hyperlipidemia 04/26/2010  . HYPERTENSION, BENIGN 04/26/2010  . CAD, NATIVE VESSEL 04/26/2010      Prior to Admission medications   Medication Sig Start Date End Date Taking? Authorizing Provider  acetaminophen (TYLENOL) 500 MG tablet Take 500 mg by mouth every 6 (six) hours as needed for mild pain.     [provider]  albuterol (PROVENTIL HFA;VENTOLIN HFA) 108 (90 Base) MCG/ACT inhaler Inhale 2 puffs into the lungs every 6 (six) hours as needed for wheezing or shortness of breath.     [provider]  apixaban (ELIQUIS) 5 MG TABS tablet Take 1 tablet (5 mg total) by mouth 2 (two) times daily. 01/20/17   Gladstone Lighter, MD  aspirin EC 325 MG  tablet Take 325 mg by mouth daily.     [provider]  atorvastatin (LIPITOR) 10 MG tablet Take 10 mg by mouth at bedtime.     [provider]  budesonide-formoterol (SYMBICORT) 160-4.5 MCG/ACT inhaler Inhale 2 puffs into the lungs 2 (two) times daily.    [provider]  calcium acetate (PHOSLO) 667 MG capsule Take 667 mg by mouth daily.     [provider]  carvedilol (COREG) 6.25 MG tablet Take 1 tablet (6.25 mg total) by mouth 2 (two) times daily. 10/07/17   Alisa Graff, FNP  cetirizine (ZYRTEC) 10 MG tablet Take 10 mg by mouth daily.    [provider]  docusate sodium (COLACE) 100 MG capsule Take 100 mg by mouth daily as needed for mild constipation or moderate constipation.     [provider]  erythromycin ophthalmic ointment Place 1 application into the right eye at bedtime.    [provider]  fluticasone (FLONASE) 50 MCG/ACT nasal spray Place 1 spray into both  nostrils daily.     [provider]  guaifenesin (ROBAFEN) 100 MG/5ML syrup Take 200 mg by mouth 3 (three) times daily as needed for cough.    [provider]  hydrocortisone 2.5 % cream Apply 1 application topically 2 (two) times daily as needed (itching).     [provider]  Ipratropium-Albuterol (COMBIVENT RESPIMAT) 20-100 MCG/ACT AERS respimat Inhale 1 puff into the lungs every 6 (six) hours as needed for wheezing or shortness of breath.    [provider]  isosorbide mononitrate (IMDUR) 30 MG 24 hr tablet Take 30 mg by mouth daily.  08/22/16   [provider]  midodrine (PROAMATINE) 10 MG tablet Take 5 mg by mouth 2 (two) times daily.    [provider]  Multiple Vitamins-Minerals (THERA-M PO) Take 1 tablet by mouth 2 (two) times daily.    [provider]  nitroGLYCERIN (NITROSTAT) 0.4 MG SL tablet Place 0.4 mg under the tongue every 5 (five) minutes as needed for chest pain.    [provider]  pantoprazole (PROTONIX) 20 MG tablet Take 20 mg by mouth daily.    [provider]  patiromer (VELTASSA) 8.4 g packet Take 1 packet (8.4 g total) by mouth daily. Patient not taking: Reported on 04/07/2018 07/26/17   Bettey Costa, MD  polyethylene glycol (MIRALAX / GLYCOLAX) packet Take 17 g by mouth daily as needed for mild constipation.     [provider]  pregabalin (LYRICA) 75 MG capsule Take 1 capsule (75 mg total) by mouth daily. 01/22/17   Gladstone Lighter, MD  sevelamer carbonate (RENVELA) 800 MG tablet Take 800 mg by mouth 3 (three) times daily with meals.     [provider]  tiotropium (SPIRIVA) 18 MCG inhalation capsule Place 18 mcg into inhaler and inhale daily.    [provider]    Allergies Dust mite extract    Social History Social History   Tobacco Use  . Smoking status: Light Tobacco Smoker    Packs/day: 0.25    Years: 30.00    Pack years: 7.50    Types: Cigarettes    . Smokeless tobacco: Never Used  . Tobacco comment: 5  Substance Use Topics  . Alcohol use: No    Alcohol/week: 0.0 standard drinks  . Drug use: No    Comment: has used crack cocaine in past     Review of Systems Patient denies headaches, rhinorrhea, blurry vision, numbness,  shortness of breath, chest pain, edema, cough, abdominal pain, nausea, vomiting, diarrhea, dysuria, fevers, rashes or hallucinations unless otherwise stated above in HPI. ____________________________________________   PHYSICAL EXAM:  VITAL SIGNS: Vitals:   04/22/18 1200  BP: 107/66  Resp: (!) 2  Temp: 98.8 F (37.1 C)  SpO2: 97%    Constitutional: Alert and oriented. Chronically ill appearing Eyes: Conjunctivae are normal.  Head: Atraumatic. Nose: No congestion/rhinnorhea. Mouth/Throat: Mucous membranes are moist.   Neck: No stridor. Painless ROM.  Cardiovascular: Normal rate, regular rhythm. Grossly normal heart sounds.  Good peripheral circulation. Respiratory: Normal respiratory effort.  No retractions. Lungs with bibasilar crackles. Gastrointestinal: Soft and nontender. No distention. No abdominal bruits. No CVA tenderness. Genitourinary: deferred Musculoskeletal: No lower extremity tenderness nor edema  Sp left bka.  No joint effusions. Neurologic:  Normal speech and language. No gross focal neurologic deficits are appreciated. No facial droop Skin:  Skin is warm, dry and intact. No rash noted. Psychiatric: Mood and affect are normal. Speech and behavior are normal.  ____________________________________________   LABS (all labs ordered are listed, but only abnormal results are displayed)  Results for orders placed or performed during the hospital encounter of 04/22/18 (from the past 24 hour(s))  Comprehensive metabolic panel     Status: Abnormal   Collection Time: 04/22/18 12:16 PM  Result Value Ref Range   Sodium 137 135 - 145 mmol/L   Potassium 3.9 3.5 - 5.1 mmol/L   Chloride 96 (L) 98  - 111 mmol/L   CO2 31 22 - 32 mmol/L   Glucose, Bld 84 70 - 99 mg/dL   BUN 44 (H) 8 - 23 mg/dL   Creatinine, Ser 7.21 (H) 0.61 - 1.24 mg/dL   Calcium 9.1 8.9 - 10.3 mg/dL   Total Protein 7.9 6.5 - 8.1 g/dL   Albumin 3.3 (L) 3.5 - 5.0 g/dL   AST 40 15 - 41 U/L   ALT 32 0 - 44 U/L   Alkaline Phosphatase 144 (H) 38 - 126 U/L   Total Bilirubin 0.9 0.3 - 1.2 mg/dL   GFR calc non Af Amer 7 (L) >60 mL/min   GFR calc Af Amer 8 (L) >60 mL/min   Anion gap 10 5 - 15  CBC WITH DIFFERENTIAL     Status: Abnormal   Collection Time: 04/22/18 12:16 PM  Result Value Ref Range   WBC 7.2 4.0 - 10.5 K/uL   RBC 3.91 (L) 4.22 - 5.81 MIL/uL   Hemoglobin 12.1 (L) 13.0 - 17.0 g/dL   HCT 37.7 (L) 39.0 - 52.0 %   MCV 96.4 80.0 - 100.0 fL   MCH 30.9 26.0 - 34.0 pg   MCHC 32.1 30.0 - 36.0 g/dL   RDW 15.2 11.5 - 15.5 %   Platelets 174 150 - 400 K/uL   nRBC 0.0 0.0 - 0.2 %   Neutrophils Relative % 65 %   Neutro Abs 4.7 1.7 - 7.7 K/uL   Lymphocytes Relative 17 %   Lymphs Abs 1.2 0.7 - 4.0 K/uL   Monocytes Relative 13 %   Monocytes Absolute 0.9 0.1 - 1.0 K/uL   Eosinophils Relative 2 %   Eosinophils Absolute 0.2 0.0 - 0.5 K/uL   Basophils Relative 1 %   Basophils Absolute 0.0 0.0 - 0.1 K/uL   Immature Granulocytes 2 %   Abs Immature Granulocytes 0.12 (H) 0.00 - 0.07 K/uL  Lipase, blood     Status: None   Collection Time: 04/22/18 12:16 PM  Result  Value Ref Range   Lipase 40 11 - 51 U/L   ____________________________________________  EKG My review and personal interpretation at Time: 12:10   Indication: N/v  Rate: 70  Rhythm: sinus Axis:  left Other: lvh with occasional pvc, no stemi ____________________________________________  RADIOLOGY  I personally reviewed all radiographic images ordered to evaluate for the above acute complaints and reviewed radiology reports and findings.  These findings were personally discussed with the patient.  Please see medical record for radiology  report.  ____________________________________________   PROCEDURES  Procedure(s) performed:  .Critical Care Performed by: Merlyn Lot, MD Authorized by: Merlyn Lot, MD   Critical care provider statement:    Critical care time (minutes):  30   Critical care time was exclusive of:  Separately billable procedures and treating other patients   Critical care was necessary to treat or prevent imminent or life-threatening deterioration of the following conditions:  Respiratory failure   Critical care was time spent personally by me on the following activities:  Development of treatment plan with patient or surrogate, discussions with consultants, evaluation of patient's response to treatment, examination of patient, obtaining history from patient or surrogate, ordering and performing treatments and interventions, ordering and review of laboratory studies, ordering and review of radiographic studies, pulse oximetry, re-evaluation of patient's condition and review of old charts      Critical Care performed: yes ____________________________________________   INITIAL IMPRESSION / [place] / ED COURSE  Pertinent labs & imaging results that were available during my care of the patient were reviewed by me and considered in my medical decision making (see chart for details).   DDX: electrolyte abn, enteritis, gastritis, sbo, gastroparesis, pna, hypertensive urgency  [name] is a 62 y.o. who presents to the ED with symptoms as described above.   Afebrile, exam as above.  abd soft and benign.  CXR shows evidence of pna.  As patient with persistent N/V and acute on chronic respiratory failure with hypoxia on dialysis coming from NH will require admission for IV abx and further respiratory monitoring.      As part of my medical decision making, I reviewed the following data within the [place] notes reviewed and incorporated, Labs reviewed,  notes from prior ED visits.   ____________________________________________   FINAL CLINICAL IMPRESSION(S) / ED DIAGNOSES  Final diagnoses:  Pneumonia due to infectious organism, unspecified laterality, unspecified part of lung  Shortness of breath  ESRD on dialysis ([place])  Acute on chronic respiratory failure with hypoxia ([place])      NEW MEDICATIONS STARTED DURING THIS VISIT:  New Prescriptions   No medications on file     Note:  This document was prepared using Dragon voice recognition software and may include unintentional dictation errors.    Merlyn Lot, MD 04/22/18 [phone]

## 2018-04-22 NOTE — Progress Notes (Signed)
Family Meeting Note  Advance Directive:yes  Today a meeting took place with the Patient.  Patient is able to participate   The following clinical team members were present during this meeting:MD  The following were discussed:Patient's diagnosis: Pneumonia, end-stage renal disease, respiratory failure, Patient's progosis: Unable to determine and Goals for treatment: Full Code  Additional follow-up to be provided: prn  Time spent during discussion:20 minutes  Gorden Harms, MD

## 2018-04-22 NOTE — ED Triage Notes (Signed)
Patient from [facility], arrives with c/o of sob. Recently diagnosed with PNA. Currently taking ABX. O2 dependant at home. As per ems was not using o2 at home and was sating 82% on ra. Patient dialyzed today.

## 2018-04-22 NOTE — ED Notes (Signed)
ED TO INPATIENT HANDOFF REPORT  Name/Age/Gender [name] 62 y.o. male  Code Status Code Status History    Date Active Date Inactive Code Status Order ID Comments User Context   12/21/2017 1105 12/23/2017 1713 Full Code [id number]  Henreitta Leber, MD Inpatient   09/18/2017 1335 09/19/2017 2041 Full Code [id number]  Bettey Costa, MD Inpatient   07/21/2017 1610 07/26/2017 0039 Full Code [id number]  Bettey Costa, MD Inpatient   06/22/2017 2145 06/23/2017 1805 Full Code [id number]  Henreitta Leber, MD Inpatient   04/07/2017 0937 04/10/2017 1828 Full Code [id number]  Eloise Levels, MD ED   01/19/2017 0145 01/21/2017 1941 Full Code [id number]  Lance Coon, MD Inpatient   11/11/2016 0421 11/14/2016 1908 Full Code [id number]  Harrie Foreman, MD Inpatient   10/28/2016 0756 10/30/2016 1930 Full Code [id number]  Demetrios Loll, MD ED   08/21/2016 0408 08/22/2016 1826 DNR [id number]  Harrie Foreman, MD Inpatient   08/03/2016 2319 08/07/2016 1928 DNR [id number]  Lance Coon, MD Inpatient   07/26/2016 1707 07/31/2016 2158 DNR [id number]  Vaughan Basta, MD Inpatient   05/22/2016 0034 05/24/2016 1911 Full Code [id number]  Harvie Bridge, DO Inpatient   01/13/2015 0144 01/14/2015 1446 Full Code [id number]  Juluis Mire, MD ED   12/15/2014 0932 12/15/2014 1431 Full Code [id number]  Algernon Huxley, MD Inpatient   05/06/2014 2029 05/10/2014 1723 Full Code [id number]  Elam Dutch, MD Inpatient   05/04/2014 2224 05/06/2014 2029 Full Code [id number]  Rise Patience, MD Inpatient      Home/SNF/Other ADMIT  Chief Complaint sob  Level of Care/Admitting Diagnosis ED Disposition    ED Disposition Condition [place]: [place] [100120]  Level of Care: Med-Surg [16]  Diagnosis: HCAP (healthcare-associated pneumonia) [291916]  Admitting Physician: Gorden Harms [[id number]]  Attending Physician: Gorden Harms [[id number]]  Estimated length of stay: past  midnight tomorrow  Certification:: I certify this patient will need inpatient services for at least 2 midnights  PT Class (Do Not Modify): Inpatient [101]  PT Acc Code (Do Not Modify): Private [1]       Medical History Past Medical History:  Diagnosis Date  . 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 Amputee, below knee, left ([place])   . Anemia   . Asthma   . Cardiomyopathy ([place])   . CHF (congestive heart failure) ([place])   . Chronic systolic heart failure ([place])   . Complication of anesthesia    hypotension  . COPD (chronic obstructive pulmonary disease) ([facility])   . Coronary artery disease   . Dialysis patient ([place])    Mon, Wed, Fri  . End stage renal disease ([place])   . GERD (gastroesophageal reflux disease)   . Headache   . History of kidney stones   . History of pulmonary embolism   . HLD (hyperlipidemia)   . HTN (hypertension)   . Hyperparathyroidism   . Myocardial infarction ([place])   . Peripheral vascular disease ([place])   . Shortness of breath dyspnea   . Sleep apnea    NO C-PAP, Patient stated in process of  "getting one"   . Tobacco dependence     Allergies Allergies  Allergen Reactions  . Dust Mite Extract Other (See Comments)    Reaction: unknown    IV Location/Drains/Wounds Patient Lines/Drains/Airways Status   Active Line/Drains/Airways    Name:   Placement date:   Placement time:  Site:   Days:   Peripheral IV 12/21/17 Right Hand   12/21/17    0735    Hand   122   Peripheral IV 12/21/17 Right Hand   12/21/17    0745    Hand   122   Peripheral IV 12/21/17 Right Hand   12/21/17    0750    Hand   122   Peripheral IV 04/22/18 Right Hand   04/22/18    1219    Hand   less than 1   Fistula / Graft Left Forearm   01/19/17    0150    Forearm   458   Hemodialysis Catheter Right Femoral vein   07/25/17    1301    Femoral vein   271          Labs/Imaging Results for orders placed or performed during the hospital encounter of 04/22/18 (from the past 48  hour(s))  Comprehensive metabolic panel     Status: Abnormal   Collection Time: 04/22/18 12:16 PM  Result Value Ref Range   Sodium 137 135 - 145 mmol/L   Potassium 3.9 3.5 - 5.1 mmol/L   Chloride 96 (L) 98 - 111 mmol/L   CO2 31 22 - 32 mmol/L   Glucose, Bld 84 70 - 99 mg/dL   BUN 44 (H) 8 - 23 mg/dL   Creatinine, Ser 7.21 (H) 0.61 - 1.24 mg/dL   Calcium 9.1 8.9 - 10.3 mg/dL   Total Protein 7.9 6.5 - 8.1 g/dL   Albumin 3.3 (L) 3.5 - 5.0 g/dL   AST 40 15 - 41 U/L   ALT 32 0 - 44 U/L   Alkaline Phosphatase 144 (H) 38 - 126 U/L   Total Bilirubin 0.9 0.3 - 1.2 mg/dL   GFR calc non Af Amer 7 (L) >60 mL/min   GFR calc Af Amer 8 (L) >60 mL/min   Anion gap 10 5 - 15    Comment: Performed at [facility], [address] [place], [place] [address]  CBC WITH DIFFERENTIAL     Status: Abnormal   Collection Time: 04/22/18 12:16 PM  Result Value Ref Range   WBC 7.2 4.0 - 10.5 K/uL   RBC 3.91 (L) 4.22 - 5.81 MIL/uL   Hemoglobin 12.1 (L) 13.0 - 17.0 g/dL   HCT 37.7 (L) 39.0 - 52.0 %   MCV 96.4 80.0 - 100.0 fL   MCH 30.9 26.0 - 34.0 pg   MCHC 32.1 30.0 - 36.0 g/dL   RDW 15.2 11.5 - 15.5 %   Platelets 174 150 - 400 K/uL   nRBC 0.0 0.0 - 0.2 %   Neutrophils Relative % 65 %   Neutro Abs 4.7 1.7 - 7.7 K/uL   Lymphocytes Relative 17 %   Lymphs Abs 1.2 0.7 - 4.0 K/uL   Monocytes Relative 13 %   Monocytes Absolute 0.9 0.1 - 1.0 K/uL   Eosinophils Relative 2 %   Eosinophils Absolute 0.2 0.0 - 0.5 K/uL   Basophils Relative 1 %   Basophils Absolute 0.0 0.0 - 0.1 K/uL   Immature Granulocytes 2 %   Abs Immature Granulocytes 0.12 (H) 0.00 - 0.07 K/uL    Comment: Performed at [facility], [address] [place], [place] [address]  Lipase, blood     Status: None   Collection Time: 04/22/18 12:16 PM  Result Value Ref Range   Lipase 40 11 - 51 U/L    Comment: Performed at [facility], [address], [place], [place] [address]  Brain natriuretic peptide     Status:  Abnormal   Collection Time: 04/22/18 12:16 PM  Result Value Ref Range   B Natriuretic Peptide 1,672.0 (H) 0.0 - 100.0 pg/mL    Comment: Performed at [facility], [address] [place], [place] [address]   Dg Chest 2 View  Result Date: 04/22/2018 CLINICAL DATA:  complains of shortness of breath EXAM: CHEST - 2 VIEW COMPARISON:  12/21/2017 FINDINGS: There is marked cardiac enlargement. Aortic atherosclerosis noted. Small pleural effusions and mild interstitial edema identified. Diminished aeration to the left lung base is identified which may represent pneumonia. IMPRESSION: 1. Left base opacity which may reflect pneumonia. 2. Cardiac enlargement, bilateral pleural effusions and mild edema consistent with CHF. 3.  Aortic Atherosclerosis (ICD10-I70.0). Electronically Signed   By: Kerby Moors M.D.   On: 04/22/2018 12:56    Pending Labs Unresulted Labs (From admission, onward)    Start     Ordered   04/23/18 0500  MRSA PCR Screening  Tomorrow morning,   STAT     04/22/18 1419   04/22/18 1308  Blood culture (routine x 2)  BLOOD CULTURE X 2,   STAT     04/22/18 1308   04/22/18 1211  Influenza panel by PCR (type A & B)  (Influenza PCR Panel)  Once,   STAT     04/22/18 1210   Signed and Held  Culture, blood (routine x 2) Call MD if unable to obtain prior to antibiotics being given  BLOOD CULTURE X 2,   R    Comments:  If blood cultures drawn in Emergency Department - Do not draw and cancel order    Signed and Held   Signed and Held  Gram stain  Once,   R     Signed and Held   Signed and Held  HIV antibody (Routine Screening)  Once,   R     Signed and Held   Signed and Held  Strep pneumoniae urinary antigen  Once,   R     Signed and Held   Signed and Held  Legionella Pneumophila Serogp 1 Ur Ag  Once,   R     Signed and Held          Vitals/Pain Today's Vitals   04/22/18 1200 04/22/18 1201 04/22/18 1202  BP: 107/66    Resp: (!) 2    Temp: 98.8 F (37.1 C)    TempSrc:  Oral    SpO2: 97%    Weight:   110 kg  Height:   6\' 2"  (1.88 m)  PainSc:  3      Isolation Precautions Droplet precaution  Medications Medications  promethazine (PHENERGAN) injection 12.5 mg (12.5 mg Intravenous Given 04/22/18 1320)  ceFEPIme (MAXIPIME) 1 g in sodium chloride 0.9 % 100 mL IVPB (1 g Intravenous New Bag/Given 04/22/18 1450)  lidocaine (XYLOCAINE) 2 % viscous mouth solution 15 mL (15 mLs Mouth/Throat Given 04/22/18 1451)  vancomycin (VANCOCIN) IVPB 1000 mg/200 mL premix (has no administration in time range)  ceFEPIme (MAXIPIME) 1 g in sodium chloride 0.9 % 100 mL IVPB (has no administration in time range)  albuterol (PROVENTIL) (2.5 MG/3ML) 0.083% nebulizer solution 2.5 mg (2.5 mg Nebulization Given 04/22/18 1320)  vancomycin (VANCOCIN) IVPB 1000 mg/200 mL premix (0 mg Intravenous Stopped 04/22/18 1431)  ipratropium-albuterol (DUONEB) 0.5-2.5 (3) MG/3ML nebulizer solution 3 mL (3 mLs Nebulization Given 04/22/18 1320)    Mobility  Admit

## 2018-04-22 NOTE — Significant Event (Signed)
Rapid Response Event Note  Overview: Time Called: 0141 Arrival Time: 1641 Event Type: Respiratory  Initial Focused Assessment: Rapid response called due to patient having difficulty breathing-vitals stable- patient sating 93% on nasal cannula-    Interventions: Dr. Jerelyn Charles came to the bedside and stated that he is aware that the patient is breathing the way that he is due to his pneumonia- he stated that the patient was stable and that we do not need to do an intervention.  He asked rapid response team to leave. Plan of Care (if not transferred):  Event Summary: Name of Physician Notified: Dr. Jerelyn Charles at 1640    at    Outcome: Stayed in room and stabalized  Event End Time: [place]

## 2018-04-22 NOTE — ED Notes (Signed)
ekg completed and given to Dr.Robinson

## 2018-04-22 NOTE — Progress Notes (Signed)
Prime doctor made aware that pt has a temp of 100.8 orally and pt is unable to swallow pills at this time. Verbal order to place tylenol suppository 500 mg Q6 PRN. MD also made aware that ABGs showed O2-56, RN increased pt.'s O2 to 4L. MD also made aware that pt is unable to swallow his PO pills at this time. No new orders given at this time. Pt is currently in dialysis at this time 1830.   Rosine Solecki [employer]

## 2018-04-23 LAB — HIV ANTIBODY (ROUTINE TESTING W REFLEX): HIV Screen 4th Generation wRfx: NONREACTIVE

## 2018-04-23 LAB — MRSA PCR SCREENING: MRSA by PCR: POSITIVE — AB

## 2018-04-23 MED ORDER — GUAIFENESIN ER 600 MG PO TB12
600.0000 mg | ORAL_TABLET | Freq: Two times a day (BID) | ORAL | Status: DC
  Administered 2018-04-23 – 2018-04-24 (×3): 600 mg via ORAL
  Filled 2018-04-23 (×3): qty 1

## 2018-04-23 MED ORDER — SODIUM CHLORIDE 0.9 % IV BOLUS
250.0000 mL | Freq: Once | INTRAVENOUS | Status: AC
  Administered 2018-04-23: 250 mL via INTRAVENOUS

## 2018-04-23 NOTE — Progress Notes (Signed)
   04/23/18 0500  Clinical Encounter Type  Visited With Patient  Visit Type Initial;Follow-up  Referral From Nurse  Spiritual Encounters  Spiritual Needs Prayer  Advance Directives (For Healthcare)  Does Patient Have a Medical Advance Directive? Yes  Does patient want to make changes to medical advance directive? No - Patient declined

## 2018-04-23 NOTE — Progress Notes (Signed)
[name] [place] NAME: [name]    MR#:  [id number]  DATE OF BIRTH:  November 14, 1955  SUBJECTIVE:  CHIEF COMPLAINT:   Chief Complaint  Patient presents with  . Shortness of Breath   -Cough is better.  Mental status is much improved.  Remains on 2 L oxygen which is acute  REVIEW OF SYSTEMS:  Review of Systems  Constitutional: Positive for fever and malaise/fatigue. Negative for chills.  HENT: Negative for congestion, ear discharge, hearing loss and nosebleeds.   Respiratory: Positive for cough, sputum production and shortness of breath. Negative for wheezing.   Cardiovascular: Negative for chest pain and palpitations.  Gastrointestinal: Negative for abdominal pain, constipation, diarrhea, nausea and vomiting.  Genitourinary: Negative for dysuria.  Musculoskeletal: Negative for myalgias.  Neurological: Negative for dizziness, focal weakness, seizures, weakness and headaches.  Psychiatric/Behavioral: Negative for depression.    DRUG ALLERGIES:   Allergies  Allergen Reactions  . Dust Mite Extract Other (See Comments)    Reaction: unknown    VITALS:  Blood pressure 135/72, pulse 78, temperature 98.1 F (36.7 C), temperature source Oral, resp. rate 20, height 6\' 2"  (1.88 m), weight 93.3 kg, SpO2 94 %.  PHYSICAL EXAMINATION:  Physical Exam  GENERAL:  62 y.o.-year-old patient lying in the bed with no acute distress.  EYES: Pupils equal, round, reactive to light and accommodation. No scleral icterus. Extraocular muscles intact.  HEENT: Head atraumatic, normocephalic. Oropharynx and nasopharynx clear.  NECK:  Supple, no jugular venous distention. No thyroid enlargement, no tenderness.  LUNGS: Moving air bilaterally, however has diffuse scattered expiratory wheezing, no rales,rhonchi or crepitation. No use of accessory muscles of respiration.  CARDIOVASCULAR: S1, S2 normal. No  rubs, or gallops.  2/6 systolic murmur is  present ABDOMEN: Soft, nontender, nondistended. Bowel sounds present. No organomegaly or mass.  EXTREMITIES: Has left upper arm AV fistula.  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   NEUROLOGIC: Cranial nerves II through XII are intact. Muscle strength 5/5 in all extremities. Sensation intact. Gait not checked.  PSYCHIATRIC: The patient is alert and oriented x 3.  SKIN: No obvious rash, lesion, or ulcer.    LABORATORY PANEL:   CBC Recent Labs  Lab 04/22/18 1216  WBC 7.2  HGB 12.1*  HCT 37.7*  PLT 174   ------------------------------------------------------------------------------------------------------------------  Chemistries  Recent Labs  Lab 04/22/18 1216  NA 137  K 3.9  CL 96*  CO2 31  GLUCOSE 84  BUN 44*  CREATININE 7.21*  CALCIUM 9.1  AST 40  ALT 32  ALKPHOS 144*  BILITOT 0.9   ------------------------------------------------------------------------------------------------------------------  Cardiac Enzymes No results for input(s): TROPONINI in the last 168 hours. ------------------------------------------------------------------------------------------------------------------  RADIOLOGY:  Dg Neck Soft Tissue  Result Date: 04/22/2018 CLINICAL DATA:  Shortness of breath.  Airway obstruction. EXAM: NECK SOFT TISSUES - 1+ VIEW COMPARISON:  None. FINDINGS: There is no evidence of retropharyngeal soft tissue swelling or epiglottic enlargement. The cervical airway is unremarkable and no radio-opaque foreign body identified. Advanced vascular calcification and suspected RIGHT subclavian venous stent. Numerous absent teeth. IMPRESSION: Negative. Electronically Signed   By: Elon Alas M.D.   On: 04/22/2018 18:38   Dg Chest 2 View  Result Date: 04/22/2018 CLINICAL DATA:  complains of shortness of breath EXAM: CHEST - 2 VIEW COMPARISON:  12/21/2017 FINDINGS: There is marked cardiac enlargement. Aortic  atherosclerosis noted. Small pleural effusions and mild interstitial  edema identified. Diminished aeration to the left lung base is identified which may represent pneumonia. IMPRESSION: 1. Left base opacity which may reflect pneumonia. 2. Cardiac enlargement, bilateral pleural effusions and mild edema consistent with CHF. 3.  Aortic Atherosclerosis (ICD10-I70.0). Electronically Signed   By: Kerby Moors M.D.   On: 04/22/2018 12:56    EKG:   Orders placed or performed during the hospital encounter of 12/21/17  . ED EKG 12-Lead  . ED EKG 12-Lead    ASSESSMENT AND PLAN:   63 year old male with multiple medical problems including end-stage renal disease on Monday Wednesday Friday hemodialysis, diastolic CHF, COPD on as needed home oxygen, peripheral vascular disease status post amputations, GERD presents to hospital secondary to worsening cough and breathing  1.  Healthcare acquired pneumonia-follow-up blood cultures -On 2 L oxygen.  Continue aggressive pulmonary toilet -Bronchodilators and Mucinex -Currently on vancomycin and cefepime.  MRSA PCR is positive -Narrow antibiotics tomorrow if remains afebrile  2.  End-stage renal disease on hemodialysis-presenting with pulmonary edema secondary to noncardiac causes -Status post urgent dialysis yesterday. -Appreciate nephrology consult. -Dialysis tomorrow per schedule  3.  Hypertension-on Imdur and carvedilol.  Also takes Midodrine with meals  4.  History of pulmonary embolism-continue Eliquis  5.  GERD-on Protonix  Patient is from assisted living facility     All the records are reviewed and case discussed with Care Management/Social Workerr. Management plans discussed with the patient, family and they are in agreement.  CODE STATUS: Full code  TOTAL TIME TAKING CARE OF THIS PATIENT: 37 minutes.   POSSIBLE D/C IN 2 DAYS, DEPENDING ON CLINICAL CONDITION.   Gladstone Lighter M.D on 04/23/2018 at 10:46 AM  Between 7am to 6pm -  Pager - [phone]  After 6pm go to [email or web address] - password [email or web address] [facility]  Office  [phone]  CC: Primary care physician; Donnie Coffin, MD

## 2018-04-23 NOTE — Plan of Care (Signed)
The patient is stable at this time. Currently on 4L of oxygen via nasal cannula. Dialysis schedule for Monday, Wednesday, Friday. On contact for MRSA. Chest congestion.  Problem: Education: Goal: Knowledge of General Education information will improve Description Including pain rating scale, medication(s)/side effects and non-pharmacologic comfort measures Outcome: Progressing   Problem: Health Behavior/Discharge Planning: Goal: Ability to manage health-related needs will improve Outcome: Progressing   Problem: Clinical Measurements: Goal: Ability to maintain clinical measurements within normal limits will improve Outcome: Progressing Goal: Will remain free from infection Outcome: Progressing Goal: Diagnostic test results will improve Outcome: Progressing Goal: Respiratory complications will improve Outcome: Progressing Goal: Cardiovascular complication will be avoided Outcome: Progressing   Problem: Activity: Goal: Risk for activity intolerance will decrease Outcome: Progressing   Problem: Nutrition: Goal: Adequate nutrition will be maintained Outcome: Progressing   Problem: Coping: Goal: Level of anxiety will decrease Outcome: Progressing   Problem: Elimination: Goal: Will not experience complications related to bowel motility Outcome: Progressing Goal: Will not experience complications related to urinary retention Outcome: Progressing   Problem: Pain Managment: Goal: General experience of comfort will improve Outcome: Progressing   Problem: Safety: Goal: Ability to remain free from injury will improve Outcome: Progressing   Problem: Skin Integrity: Goal: Risk for impaired skin integrity will decrease Outcome: Progressing

## 2018-04-23 NOTE — Progress Notes (Signed)
[facility], [place] 04/23/18  Subjective:   Patient known to our practice from previous admissions.  He was sent over from dialysis for hypoxia and shortness of breath This morning, patient feels much improved.  1500 cc of fluid was removed with hemodialysis yesterday Currently still requiring oxygen supplementation with nasal cannula The grunting sounds/upper respiratory sounds have resolved   Objective:  Vital signs in last 24 hours:  Temp:  [98.1 F (36.7 C)-100.8 F (38.2 C)] 98.1 F (36.7 C) (11/28 0529) Pulse Rate:  [78-96] 78 (11/28 0529) Resp:  [2-34] 20 (11/28 0529) BP: (105-170)/(46-114) 135/72 (11/28 0529) SpO2:  [90 %-98 %] 94 % (11/28 0529) Weight:  [93.3 kg-110 kg] 93.3 kg (11/27 2215)  Weight change:  Filed Weights   04/22/18 1202 04/22/18 1820 04/22/18 2215  Weight: 110 kg 95.7 kg 93.3 kg    Intake/Output:    Intake/Output Summary (Last 24 hours) at 04/23/2018 1159 Last data filed at 04/23/2018 0600 Gross per 24 hour  Intake 278.01 ml  Output 1450 ml  Net -1171.99 ml     Physical Exam: General:  Chronically ill-appearing, lying in the bed  HEENT  oxygen  with nasal cannula, moist mucous membranes  Neck  supple  Pulm/lungs  mild scattered rhonchi,   CVS/Heart  regular, tachycardic  Abdomen:   Soft, nontender  Extremities:  No peripheral edema  Neurologic:  Alert, able to follow commands  Skin:  No acute rashes  Access:  Left upper arm AV fistula       Basic Metabolic Panel:  Recent Labs  Lab 04/22/18 1216  NA 137  K 3.9  CL 96*  CO2 31  GLUCOSE 84  BUN 44*  CREATININE 7.21*  CALCIUM 9.1     CBC: Recent Labs  Lab 04/22/18 1216  WBC 7.2  NEUTROABS 4.7  HGB 12.1*  HCT 37.7*  MCV 96.4  PLT 174      Lab Results  Component Value Date   HEPBSAG Negative 09/18/2017   HEPBSAB Non Reactive 05/22/2016      Microbiology:  Recent Results (from the past 240 hour(s))  Blood culture (routine x  2)     Status: None (Preliminary result)   Collection Time: 04/22/18  2:33 PM  Result Value Ref Range Status   Specimen Description BLOOD BLOOD RIGHT WRIST  Final   Special Requests   Final    BOTTLES DRAWN AEROBIC AND ANAEROBIC Blood Culture results may not be optimal due to an inadequate volume of blood received in culture bottles   Culture   Final    NO GROWTH < 24 HOURS Performed at [facility], [address] [place], [place] [address]    Report Status PENDING  Incomplete  Culture, blood (routine x 2) Call MD if unable to obtain prior to antibiotics being given     Status: None (Preliminary result)   Collection Time: 04/22/18  3:36 PM  Result Value Ref Range Status   Specimen Description BLOOD RIGHT WRIST  Final   Special Requests   Final    BOTTLES DRAWN AEROBIC AND ANAEROBIC Blood Culture results may not be optimal due to an inadequate volume of blood received in culture bottles   Culture   Final    NO GROWTH < 24 HOURS Performed at [facility], [address], [place], [place] [address]    Report Status PENDING  Incomplete  MRSA PCR Screening     Status: Abnormal   Collection Time: 04/23/18  4:26 AM  Result Value Ref Range Status   MRSA by PCR POSITIVE (A) NEGATIVE Final    Comment:        The GeneXpert MRSA Assay (FDA approved for NASAL specimens only), is one component of a comprehensive MRSA colonization surveillance program. It is not intended to diagnose MRSA infection nor to guide or monitor treatment for MRSA infections. RESULT CALLED TO, READ BACK BY AND VERIFIED WITH: JEANETTE BRILEY ON 04/23/18 AT 0601 QSD Performed at [facility], [address] [place], [place] [address]     Coagulation Studies: No results for input(s): LABPROT, INR in the last 72 hours.  Urinalysis: No results for input(s): COLORURINE, LABSPEC, PHURINE, GLUCOSEU, HGBUR, BILIRUBINUR, KETONESUR, PROTEINUR, UROBILINOGEN, NITRITE, LEUKOCYTESUR in the  last 72 hours.  Invalid input(s): APPERANCEUR    Imaging: Dg Neck Soft Tissue  Result Date: 04/22/2018 CLINICAL DATA:  Shortness of breath.  Airway obstruction. EXAM: NECK SOFT TISSUES - 1+ VIEW COMPARISON:  None. FINDINGS: There is no evidence of retropharyngeal soft tissue swelling or epiglottic enlargement. The cervical airway is unremarkable and no radio-opaque foreign body identified. Advanced vascular calcification and suspected RIGHT subclavian venous stent. Numerous absent teeth. IMPRESSION: Negative. Electronically Signed   By: Elon Alas M.D.   On: 04/22/2018 18:38   Dg Chest 2 View  Result Date: 04/22/2018 CLINICAL DATA:  complains of shortness of breath EXAM: CHEST - 2 VIEW COMPARISON:  12/21/2017 FINDINGS: There is marked cardiac enlargement. Aortic atherosclerosis noted. Small pleural effusions and mild interstitial edema identified. Diminished aeration to the left lung base is identified which may represent pneumonia. IMPRESSION: 1. Left base opacity which may reflect pneumonia. 2. Cardiac enlargement, bilateral pleural effusions and mild edema consistent with CHF. 3.  Aortic Atherosclerosis (ICD10-I70.0). Electronically Signed   By: Kerby Moors M.D.   On: 04/22/2018 12:56     Medications:   . sodium chloride    . [START ON 04/24/2018] ceFEPime (MAXIPIME) IV    . famotidine (PEPCID) IV 20 mg (04/23/18 1019)  . vancomycin    . vancomycin     . apixaban  5 mg Oral BID  . aspirin EC  325 mg Oral Daily  . atorvastatin  10 mg Oral QHS  . calcium acetate  667 mg Oral Daily  . carvedilol  6.25 mg Oral BID  . Chlorhexidine Gluconate Cloth  6 each Topical Q0600  . erythromycin  1 application Right Eye QHS  . fluticasone  1 spray Each Nare Daily  . fluticasone furoate-vilanterol  1 puff Inhalation Daily  . guaiFENesin  600 mg Oral BID  . isosorbide mononitrate  30 mg Oral Daily  . loratadine  10 mg Oral Daily  . midodrine  5 mg Oral TID WC  . multivitamin with  minerals  1 tablet Oral BID  . pantoprazole  20 mg Oral Daily  . pregabalin  75 mg Oral Daily  . sevelamer carbonate  800 mg Oral TID WC  . tiotropium  18 mcg Inhalation Daily   sodium chloride, acetaminophen, albuterol, docusate sodium, guaiFENesin, hydrocortisone, ipratropium-albuterol, lidocaine, nitroGLYCERIN, polyethylene glycol, promethazine  Assessment/ Plan:  62 y.o. male with ESRD on hemodialysis, hypertension, hyperlipidemia, anemia, COPD, peripheral vascular disease, left BKA, right toe amputations  MWF [facility] Nephrology [facility].   1.  End-stage renal disease 2.  Shortness of breath 3.  Anemia of chronic kidney disease 4.  Chronic hypotension 5.  Secondary hyperparathyroidism  Plan: Patient clinically has improved with hemodialysis after fluid removal  Next routine hemodialysis planned for tomorrow  monitor phosphorus Hemoglobin greater than 12.  Continue to monitor Continue home doses of binders    LOS: 1 Onesti Bonfiglio 11/28/201911:59 AM  [place], [place]  Note: This note was prepared with Dragon dictation. Any transcription errors are unintentional

## 2018-04-24 ENCOUNTER — Inpatient Hospital Stay: Payer: Medicare Other

## 2018-04-24 LAB — BASIC METABOLIC PANEL
ANION GAP: 11 (ref 5–15)
BUN: 56 mg/dL — ABNORMAL HIGH (ref 8–23)
CO2: 28 mmol/L (ref 22–32)
Calcium: 8.8 mg/dL — ABNORMAL LOW (ref 8.9–10.3)
Chloride: 99 mmol/L (ref 98–111)
Creatinine, Ser: 7.83 mg/dL — ABNORMAL HIGH (ref 0.61–1.24)
GFR, EST AFRICAN AMERICAN: 8 mL/min — AB (ref >60)
GFR, EST NON AFRICAN AMERICAN: 7 mL/min — AB (ref >60)
Glucose, Bld: 106 mg/dL — ABNORMAL HIGH (ref 70–99)
POTASSIUM: 4.2 mmol/L (ref 3.5–5.1)
SODIUM: 138 mmol/L (ref 135–145)

## 2018-04-24 LAB — PHOSPHORUS: PHOSPHORUS: 5.5 mg/dL — AB (ref 2.5–4.6)

## 2018-04-24 MED ORDER — FAMOTIDINE 20 MG PO TABS
20.0000 mg | ORAL_TABLET | Freq: Every day | ORAL | Status: DC
  Administered 2018-04-24: 20 mg via ORAL
  Filled 2018-04-24: qty 1

## 2018-04-24 MED ORDER — ALBUTEROL SULFATE (2.5 MG/3ML) 0.083% IN NEBU: 2.5000 mg | INHALATION_SOLUTION | Freq: Four times a day (QID) | RESPIRATORY_TRACT | 1 refills | Status: DC | PRN

## 2018-04-24 MED ORDER — CEFPODOXIME PROXETIL 200 MG PO TABS: 200.0000 mg | ORAL_TABLET | ORAL | 0 refills | Status: AC

## 2018-04-24 MED ORDER — HYDROCOD POLST-CPM POLST ER 10-8 MG/5ML PO SUER
5.0000 mL | Freq: Two times a day (BID) | ORAL | Status: DC
  Administered 2018-04-24: 5 mL via ORAL
  Filled 2018-04-24: qty 5

## 2018-04-24 MED ORDER — HYDROCOD POLST-CPM POLST ER 10-8 MG/5ML PO SUER: 5.0000 mL | Freq: Two times a day (BID) | ORAL | 0 refills | Status: AC

## 2018-04-24 MED ORDER — PREDNISONE 20 MG PO TABS: 40.0000 mg | ORAL_TABLET | Freq: Every day | ORAL | 0 refills | Status: AC

## 2018-04-24 MED ORDER — METHYLPREDNISOLONE SODIUM SUCC 125 MG IJ SOLR
60.0000 mg | INTRAMUSCULAR | Status: DC
  Administered 2018-04-24: 60 mg via INTRAVENOUS
  Filled 2018-04-24: qty 2

## 2018-04-24 NOTE — Progress Notes (Signed)
Post HD assessment. PT tolerated tx well without c/o. Pt was a difficult stick,  pt experienced 2 unsuccessful venous access attempts prior to initiation  of HD tx. Net UF 2502, goal met.    04/24/18 1547  Vital Signs  Temp 97.7 F (36.5 C)  Temp Source Oral  Pulse Rate 69  Pulse Rate Source Monitor  Resp 12  BP (!) 77/59  BP Location Right Arm  BP Method Automatic  Patient Position (if appropriate) Lying  Oxygen Therapy  SpO2 99 %  O2 Device Nasal Cannula  O2 Flow Rate (L/min) 2.5 L/min  Dialysis Weight  Weight 92.5 kg  Type of Weight Post-Dialysis  Post-Hemodialysis Assessment  Rinseback Volume (mL) 250 mL  KECN 74.2 V  Dialyzer Clearance Lightly streaked  Duration of HD Treatment -hour(s) 3.5 hour(s)  Hemodialysis Intake (mL) 700 mL  UF Total -Machine (mL) 3202 mL  Net UF (mL) 2502 mL  Tolerated HD Treatment Yes  AVG/AVF Arterial Site Held (minutes) 10 minutes  AVG/AVF Venous Site Held (minutes) 10 minutes  Education / Care Plan  Dialysis Education Provided Yes  Documented Education in Care Plan Yes  Fistula / Graft Left Forearm  Placement Date/Time: (c) 01/19/17 0150   Placed prior to admission: Yes  Orientation: Left  Access Location: Forearm  Site Condition No complications  Fistula / Graft Assessment Present;Thrill;Bruit  Status Deaccessed  Drainage Description None

## 2018-04-24 NOTE — Progress Notes (Signed)
Discharge order received. Patient is alert and oriented.. No signs of acute distress. Discharge instructions given. Patient verbalized understanding. No other issues noted at this time.

## 2018-04-24 NOTE — Progress Notes (Signed)
HD tx end    04/24/18 1540  Vital Signs  Pulse Rate 73  Pulse Rate Source Monitor  Resp 14  BP (!) 79/63  BP Location Right Arm  BP Method Automatic  Patient Position (if appropriate) Lying  Oxygen Therapy  SpO2 98 %  O2 Device Nasal Cannula  O2 Flow Rate (L/min) 2.5 L/min  During Hemodialysis Assessment  Dialysis Fluid Bolus Normal Saline  Bolus Amount (mL) 250 mL  Intra-Hemodialysis Comments Tx completed

## 2018-04-24 NOTE — Progress Notes (Signed)
Pre HD assessment    04/24/18 1119  Vital Signs  Temp 97.6 F (36.4 C)  Temp Source Oral  Pulse Rate 70  Pulse Rate Source Monitor  Resp 14  BP (!) 107/93  BP Location Right Arm  BP Method Automatic  Patient Position (if appropriate) Lying  Oxygen Therapy  SpO2 100 %  O2 Device Nasal Cannula  O2 Flow Rate (L/min) 2.5 L/min  Pain Assessment  Pain Scale 0-10  Pain Score 0  Dialysis Weight  Weight 95.2 kg  Type of Weight Pre-Dialysis  Time-Out for Hemodialysis  What Procedure? HD  Pt Identifiers(min of two) First/Last Name;MRN/Account#  Correct Site? Yes  Correct Side? Yes  Correct Procedure? Yes  Consents Verified? Yes  Rad Studies Available? N/A  Safety Precautions Reviewed? Yes  Research scientist (physical sciences)  (3A)  Station Number 4  UF/Alarm Test Passed  Conductivity: Meter 13.6  Conductivity: Machine  13.8  pH 7.6  Reverse Osmosis main  Normal Saline Lot Number [id number]  Dialyzer Lot Number [id number]  Disposable Set Lot Number [id number]  Machine Temperature 98.6 F (37 C)  Musician and Audible Yes  Blood Lines Intact and Secured Yes  Pre Treatment Patient Checks  Vascular access used during treatment Fistula  Hepatitis B Surface Antigen Results Negative  Date Hepatitis B Surface Antigen Drawn 09/18/17  Isolation Initiated Yes (MRSA, contact )  Hepatitis B Surface Antibody  (>10)  Date Hepatitis B Surface Antibody Drawn 11/17/17  Hemodialysis Consent Verified Yes  Hemodialysis Standing Orders Initiated Yes  ECG (Telemetry) Monitor On Yes  Prime Ordered Normal Saline  Length of  DialysisTreatment -hour(s) 3.5 Hour(s)  Dialyzer Elisio 17H NR  Dialysate 3K, 2.5 Ca  Dialysis Anticoagulant None  Dialysate Flow Ordered 800  Blood Flow Rate Ordered 400 mL/min  Ultrafiltration Goal 2.5 Liters  Pre Treatment Labs Other (Comment) (vancomycin random)  Dialysis Blood Pressure Support Ordered Normal Saline  Education / Care Plan  Dialysis Education  Provided Yes  Documented Education in Care Plan Yes  Fistula / Graft Left Forearm  Placement Date/Time: (c) 01/19/17 0150   Placed prior to admission: Yes  Orientation: Left  Access Location: Forearm  Site Condition No complications  Fistula / Graft Assessment Present;Thrill;Bruit ( slight whistle at top of HD access)  Drainage Description None

## 2018-04-24 NOTE — Progress Notes (Signed)
[facility], [place] 04/24/18  Subjective:   Patient feels well today.  1500 cc removed with dialysis on Wednesday Denies any nausea or vomiting   Objective:  Vital signs in last 24 hours:  Temp:  [98.5 F (36.9 C)-99 F (37.2 C)] 98.5 F (36.9 C) (11/28 2100) Pulse Rate:  [66-74] 70 (11/29 0616) Resp:  [18-20] 18 (11/29 0616) BP: (84-101)/(47-70) 99/70 (11/29 0616) SpO2:  [90 %-100 %] 97 % (11/29 0616) Weight:  [94.6 kg] 94.6 kg (11/29 0616)  Weight change: -17.2 kg Filed Weights   04/22/18 2215 04/23/18 0851 04/24/18 0616  Weight: 93.3 kg 92.8 kg 94.6 kg    Intake/Output:    Intake/Output Summary (Last 24 hours) at 04/24/2018 1052 Last data filed at 04/24/2018 0048 Gross per 24 hour  Intake 640.94 ml  Output 0 ml  Net 640.94 ml     Physical Exam: General:  Chronically ill-appearing, lying in the bed  HEENT  oxygen  with nasal cannula, moist mucous membranes  Neck  supple  Pulm/lungs  mild scattered rhonchi, mild basilar crackles  CVS/Heart  regular, tachycardic  Abdomen:   Soft, nontender  Extremities:  No peripheral edema, left BKA  Neurologic:  Alert, oriented, able to follow commands  Skin:  No acute rashes  Access:  Left upper arm AV fistula       Basic Metabolic Panel:  Recent Labs  Lab 04/22/18 1216 04/24/18 0409  NA 137 138  K 3.9 4.2  CL 96* 99  CO2 31 28  GLUCOSE 84 106*  BUN 44* 56*  CREATININE 7.21* 7.83*  CALCIUM 9.1 8.8*  PHOS  --  5.5*     CBC: Recent Labs  Lab 04/22/18 1216  WBC 7.2  NEUTROABS 4.7  HGB 12.1*  HCT 37.7*  MCV 96.4  PLT 174      Lab Results  Component Value Date   HEPBSAG Negative 09/18/2017   HEPBSAB Non Reactive 05/22/2016      Microbiology:  Recent Results (from the past 240 hour(s))  Blood culture (routine x 2)     Status: None (Preliminary result)   Collection Time: 04/22/18  2:33 PM  Result Value Ref Range Status   Specimen Description BLOOD BLOOD RIGHT  WRIST  Final   Special Requests   Final    BOTTLES DRAWN AEROBIC AND ANAEROBIC Blood Culture results may not be optimal due to an inadequate volume of blood received in culture bottles   Culture   Final    NO GROWTH 2 DAYS Performed at [facility], [address] [place], [place] [address]    Report Status PENDING  Incomplete  Culture, blood (routine x 2) Call MD if unable to obtain prior to antibiotics being given     Status: None (Preliminary result)   Collection Time: 04/22/18  3:36 PM  Result Value Ref Range Status   Specimen Description BLOOD RIGHT WRIST  Final   Special Requests   Final    BOTTLES DRAWN AEROBIC AND ANAEROBIC Blood Culture results may not be optimal due to an inadequate volume of blood received in culture bottles   Culture   Final    NO GROWTH 2 DAYS Performed at [facility], [address] [place], [place] [address]    Report Status PENDING  Incomplete  MRSA PCR Screening     Status: Abnormal   Collection Time: 04/23/18  4:26 AM  Result Value Ref Range Status   MRSA by PCR POSITIVE (A)  NEGATIVE Final    Comment:        The GeneXpert MRSA Assay (FDA approved for NASAL specimens only), is one component of a comprehensive MRSA colonization surveillance program. It is not intended to diagnose MRSA infection nor to guide or monitor treatment for MRSA infections. RESULT CALLED TO, READ BACK BY AND VERIFIED WITH: JEANETTE BRILEY ON 04/23/18 AT 0601 QSD Performed at [facility], [address] [place], [place] [address]     Coagulation Studies: No results for input(s): LABPROT, INR in the last 72 hours.  Urinalysis: No results for input(s): COLORURINE, LABSPEC, PHURINE, GLUCOSEU, HGBUR, BILIRUBINUR, KETONESUR, PROTEINUR, UROBILINOGEN, NITRITE, LEUKOCYTESUR in the last 72 hours.  Invalid input(s): APPERANCEUR    Imaging: Dg Neck Soft Tissue  Result Date: 04/22/2018 CLINICAL DATA:  Shortness of breath.  Airway  obstruction. EXAM: NECK SOFT TISSUES - 1+ VIEW COMPARISON:  None. FINDINGS: There is no evidence of retropharyngeal soft tissue swelling or epiglottic enlargement. The cervical airway is unremarkable and no radio-opaque foreign body identified. Advanced vascular calcification and suspected RIGHT subclavian venous stent. Numerous absent teeth. IMPRESSION: Negative. Electronically Signed   By: Elon Alas M.D.   On: 04/22/2018 18:38   Dg Chest 2 View  Result Date: 04/24/2018 CLINICAL DATA:  Patient with history of pneumonia. Instead renal disease. EXAM: CHEST - 2 VIEW COMPARISON:  Chest radiograph 04/22/2018 FINDINGS: Monitoring leads overlie the patient. Stable cardiomegaly. Diffuse bilateral interstitial pulmonary opacities. Improved aeration left lung base. Small bilateral pleural effusions. Thoracic spine degenerative changes. IMPRESSION: Cardiomegaly with mild interstitial edema. Electronically Signed   By: Lovey Newcomer M.D.   On: 04/24/2018 08:20   Dg Chest 2 View  Result Date: 04/22/2018 CLINICAL DATA:  complains of shortness of breath EXAM: CHEST - 2 VIEW COMPARISON:  12/21/2017 FINDINGS: There is marked cardiac enlargement. Aortic atherosclerosis noted. Small pleural effusions and mild interstitial edema identified. Diminished aeration to the left lung base is identified which may represent pneumonia. IMPRESSION: 1. Left base opacity which may reflect pneumonia. 2. Cardiac enlargement, bilateral pleural effusions and mild edema consistent with CHF. 3.  Aortic Atherosclerosis (ICD10-I70.0). Electronically Signed   By: Kerby Moors M.D.   On: 04/22/2018 12:56     Medications:   . sodium chloride    . ceFEPime (MAXIPIME) IV    . vancomycin     . apixaban  5 mg Oral BID  . aspirin EC  325 mg Oral Daily  . atorvastatin  10 mg Oral QHS  . calcium acetate  667 mg Oral Daily  . carvedilol  6.25 mg Oral BID  . Chlorhexidine Gluconate Cloth  6 each Topical Q0600  .  chlorpheniramine-HYDROcodone  5 mL Oral Q12H  . erythromycin  1 application Right Eye QHS  . famotidine  20 mg Oral Daily  . fluticasone  1 spray Each Nare Daily  . fluticasone furoate-vilanterol  1 puff Inhalation Daily  . guaiFENesin  600 mg Oral BID  . isosorbide mononitrate  30 mg Oral Daily  . loratadine  10 mg Oral Daily  . methylPREDNISolone (SOLU-MEDROL) injection  60 mg Intravenous Q24H  . midodrine  5 mg Oral TID WC  . multivitamin with minerals  1 tablet Oral BID  . pantoprazole  20 mg Oral Daily  . pregabalin  75 mg Oral Daily  . sevelamer carbonate  800 mg Oral TID WC  . tiotropium  18 mcg Inhalation Daily   sodium chloride, acetaminophen, albuterol, docusate sodium, guaiFENesin, hydrocortisone, ipratropium-albuterol, lidocaine, nitroGLYCERIN,  polyethylene glycol, promethazine  Assessment/ Plan:  62 y.o. male with ESRD on hemodialysis, hypertension, hyperlipidemia, anemia, COPD, peripheral vascular disease, left BKA, right toe amputations  MWF [facility] Nephrology [facility].   1.  End-stage renal disease 2.  Shortness of breath 3.  Anemia of chronic kidney disease 4.  Chronic hypotension 5.  Secondary hyperparathyroidism  Plan: Patient clinically has improved with hemodialysis after fluid removal Next routine hemodialysis planned for today.  UF goal 2-2.5 as tolerated  monitor phosphorus Hemoglobin greater than 12.  Continue to monitor Continue home doses of binders MRSA PCR positive.  Isolation.   LOS: 2 Shakeria Robinette Candiss Norse 11/29/201910:52 AM  [place], [place]  Note: This note was prepared with Dragon dictation. Any transcription errors are unintentional

## 2018-04-24 NOTE — Progress Notes (Signed)
HD tx start    04/24/18 1153  Vital Signs  Pulse Rate 64  Pulse Rate Source Monitor  Resp 11  BP (!) 85/72  BP Location Right Arm  BP Method Automatic  Patient Position (if appropriate) Lying  Oxygen Therapy  SpO2 100 %  O2 Device Nasal Cannula  O2 Flow Rate (L/min) 2.5 L/min  During Hemodialysis Assessment  Blood Flow Rate (mL/min) 300 mL/min  Arterial Pressure (mmHg) -100 mmHg  Venous Pressure (mmHg) 230 mmHg  Transmembrane Pressure (mmHg) 60 mmHg  Ultrafiltration Rate (mL/min) 860 mL/min  Dialysate Flow Rate (mL/min) 800 ml/min  Conductivity: Machine  13.6  HD Safety Checks Performed Yes  Dialysis Fluid Bolus Normal Saline  Bolus Amount (mL) 250 mL  Intra-Hemodialysis Comments Tx initiated  Fistula / Graft Left Forearm  Placement Date/Time: (c) 01/19/17 0150   Placed prior to admission: Yes  Orientation: Left  Access Location: Forearm  Status Accessed  Needle Size 15

## 2018-04-24 NOTE — Progress Notes (Signed)
[facility] NAME: [name]    MR#:  [id number]  DATE OF BIRTH:  04/21/1956  SUBJECTIVE:  CHIEF COMPLAINT:   Chief Complaint  Patient presents with  . Shortness of Breath   -Afebrile now.  Still has the cough.  Breathing is much improved.  Has oxygen at assisted living facility  REVIEW OF SYSTEMS:  Review of Systems  Constitutional: Negative for chills, fever and malaise/fatigue.  HENT: Negative for congestion, ear discharge, hearing loss and nosebleeds.   Respiratory: Positive for cough, sputum production and shortness of breath. Negative for wheezing.   Cardiovascular: Negative for chest pain and palpitations.  Gastrointestinal: Negative for abdominal pain, constipation, diarrhea, nausea and vomiting.  Genitourinary: Negative for dysuria.  Musculoskeletal: Negative for myalgias.  Neurological: Negative for dizziness, focal weakness, seizures, weakness and headaches.  Psychiatric/Behavioral: Negative for depression.    DRUG ALLERGIES:   Allergies  Allergen Reactions  . Dust Mite Extract Other (See Comments)    Reaction: unknown    VITALS:  Blood pressure 99/70, pulse 70, temperature 98.5 F (36.9 C), temperature source Oral, resp. rate 18, height 6\' 2"  (1.88 m), weight 94.6 kg, SpO2 97 %.  PHYSICAL EXAMINATION:  Physical Exam  GENERAL:  62 y.o.-year-old patient lying in the bed with no acute distress.  EYES: Pupils equal, round, reactive to light and accommodation. No scleral icterus. Extraocular muscles intact.  HEENT: Head atraumatic, normocephalic. Oropharynx and nasopharynx clear.  NECK:  Supple, no jugular venous distention. No thyroid enlargement, no tenderness.  LUNGS: Moving air bilaterally, however has diffuse scattered expiratory wheezing, no rales,rhonchi or crepitation. No use of accessory muscles of respiration.  CARDIOVASCULAR: S1, S2 normal. No  rubs, or gallops.  2/6 systolic murmur is  present ABDOMEN: Soft, nontender, nondistended. Bowel sounds present. No organomegaly or mass.  EXTREMITIES: Has left upper arm AV fistula.  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   NEUROLOGIC: Cranial nerves II through XII are intact. Muscle strength 5/5 in all extremities. Sensation intact. Gait not checked.  PSYCHIATRIC: The patient is alert and oriented x 3.  SKIN: No obvious rash, lesion, or ulcer.    LABORATORY PANEL:   CBC Recent Labs  Lab 04/22/18 1216  WBC 7.2  HGB 12.1*  HCT 37.7*  PLT 174   ------------------------------------------------------------------------------------------------------------------  Chemistries  Recent Labs  Lab 04/22/18 1216 04/24/18 0409  NA 137 138  K 3.9 4.2  CL 96* 99  CO2 31 28  GLUCOSE 84 106*  BUN 44* 56*  CREATININE 7.21* 7.83*  CALCIUM 9.1 8.8*  AST 40  --   ALT 32  --   ALKPHOS 144*  --   BILITOT 0.9  --    ------------------------------------------------------------------------------------------------------------------  Cardiac Enzymes No results for input(s): TROPONINI in the last 168 hours. ------------------------------------------------------------------------------------------------------------------  RADIOLOGY:  Dg Neck Soft Tissue  Result Date: 04/22/2018 CLINICAL DATA:  Shortness of breath.  Airway obstruction. EXAM: NECK SOFT TISSUES - 1+ VIEW COMPARISON:  None. FINDINGS: There is no evidence of retropharyngeal soft tissue swelling or epiglottic enlargement. The cervical airway is unremarkable and no radio-opaque foreign body identified. Advanced vascular calcification and suspected RIGHT subclavian venous stent. Numerous absent teeth. IMPRESSION: Negative. Electronically Signed   By: Elon Alas M.D.   On: 04/22/2018 18:38   Dg Chest 2 View  Result Date: 04/24/2018 CLINICAL DATA:  Patient with history of pneumonia. Instead renal disease.  EXAM: CHEST -  2 VIEW COMPARISON:  Chest radiograph 04/22/2018 FINDINGS: Monitoring leads overlie the patient. Stable cardiomegaly. Diffuse bilateral interstitial pulmonary opacities. Improved aeration left lung base. Small bilateral pleural effusions. Thoracic spine degenerative changes. IMPRESSION: Cardiomegaly with mild interstitial edema. Electronically Signed   By: Lovey Newcomer M.D.   On: 04/24/2018 08:20   Dg Chest 2 View  Result Date: 04/22/2018 CLINICAL DATA:  complains of shortness of breath EXAM: CHEST - 2 VIEW COMPARISON:  12/21/2017 FINDINGS: There is marked cardiac enlargement. Aortic atherosclerosis noted. Small pleural effusions and mild interstitial edema identified. Diminished aeration to the left lung base is identified which may represent pneumonia. IMPRESSION: 1. Left base opacity which may reflect pneumonia. 2. Cardiac enlargement, bilateral pleural effusions and mild edema consistent with CHF. 3.  Aortic Atherosclerosis (ICD10-I70.0). Electronically Signed   By: Kerby Moors M.D.   On: 04/22/2018 12:56    EKG:   Orders placed or performed during the hospital encounter of 12/21/17  . ED EKG 12-Lead  . ED EKG 12-Lead    ASSESSMENT AND PLAN:   62 year old male with multiple medical problems including end-stage renal disease on Monday Wednesday Friday hemodialysis, diastolic CHF, COPD on as needed home oxygen, peripheral vascular disease status post amputations, GERD presents to hospital secondary to worsening cough and breathing  1.  Healthcare acquired pneumonia- blood cultures are negative -On 2 L oxygen.  Patient states he is on oxygen at the assisted living. -Bronchodilators and cough meds -on vancomycin and cefepime.  MRSA PCR is positive -And is afebrile now.  Will change antibiotics to Ceftin at discharge. -Also has some reactive airway disease with underlying COPD.  1 dose of Solu-Medrol today and discharged on prednisone for 5 days. -Repeat chest x-ray today with cardiomegaly  and pulmonary edema  2.  End-stage renal disease on hemodialysis-presenting with pulmonary edema secondary to noncardiac causes -Plan for dialysis today per schedule -Appreciate nephrology consult.  3.  Hypertension-on Imdur and carvedilol.  Also takes Midodrine with meals  4.  History of pulmonary embolism-continue Eliquis  5.  GERD-on Protonix  Patient is from assisted living facility-the [facility] for possible discharge today after dialysis     All the records are reviewed and case discussed with Care Management/Social Workerr. Management plans discussed with the patient, family and they are in agreement.  CODE STATUS: Full code  TOTAL TIME TAKING CARE OF THIS PATIENT: 37 minutes.   POSSIBLE D/C IN 1-2 DAYS, DEPENDING ON CLINICAL CONDITION.   Gladstone Lighter M.D on 04/24/2018 at 9:14 AM  Between 7am to 6pm - Pager - [phone]  After 6pm go to [email or web address] - password [email or web address] [facility]  Office  [phone]  CC: Primary care physician; Donnie Coffin, MD

## 2018-04-24 NOTE — Progress Notes (Signed)
Post HD assessment    04/24/18 1546  Neurological  Level of Consciousness Alert  Orientation Level Oriented X4  Respiratory  Respiratory Pattern Dyspnea at rest;Dyspnea with exertion  Chest Assessment Chest expansion symmetrical  Cough Non-productive;Strong  Cardiac  ECG Monitor Yes  Cardiac Rhythm NSR  Vascular  R Radial Pulse +2  L Radial Pulse +2  Integumentary  Integumentary (WDL) X  Skin Color Appropriate for ethnicity  Musculoskeletal  Musculoskeletal (WDL) X  Generalized Weakness Yes  Assistive Device None  GU Assessment  Genitourinary (WDL) X  Genitourinary Symptoms  (HD)  Psychosocial  Psychosocial (WDL) WDL

## 2018-04-24 NOTE — Progress Notes (Signed)
Pre HD assessment    04/24/18 1120  Neurological  Level of Consciousness Alert  Orientation Level Oriented X4  Respiratory  Respiratory Pattern Dyspnea at rest;Dyspnea with exertion  Chest Assessment Chest expansion symmetrical  Cough Non-productive;Strong  Cardiac  ECG Monitor Yes  Cardiac Rhythm NSR  Vascular  R Radial Pulse +2  L Radial Pulse +2  Integumentary  Integumentary (WDL) X  Skin Color Appropriate for ethnicity  Musculoskeletal  Musculoskeletal (WDL) X  Generalized Weakness Yes  Assistive Device None  GU Assessment  Genitourinary (WDL) X  Genitourinary Symptoms  (HD)  Psychosocial  Psychosocial (WDL) WDL

## 2018-04-24 NOTE — Clinical Social Work Note (Signed)
Clinical Social Work Assessment  Patient Details  Name: [name] MRN: [id number] Date of Birth: 14-Nov-1955  Date of referral:  04/24/18               Reason for consult:  Facility Placement                Permission sought to share information with:  Case Manager, Customer service manager, Family Supports Permission granted to share information::  Yes, Verbal Permission Granted  Name::        Agency::     Relationship::     Contact Information:     Housing/Transportation Living arrangements for the past 2 months:  [place] of Information:  Patient Patient Interpreter Needed:  None Criminal Activity/Legal Involvement Pertinent to Current Situation/Hospitalization:  No - Comment as needed Significant Relationships:  Siblings Lives with:  Facility Resident Do you feel safe going back to the place where you live?  Yes Need for family participation in patient care:  Yes (Comment)  Care giving concerns:  Patient is a resident at [facility]   Social Worker assessment / plan:  CSW consulted for SNF placement. CSW met with patient to discuss discharge plan. Patient states that he lives at [facility] assisted living and has lived there for about a year. Patient reports that he is working with physical therapy at the [facility] and has been using his wheelchair more recently. Patient also reports that he uses oxygen chronically at the Assisted Living. Patient would like to return to ALF when ready. Patient is medically ready for discharge today. Patient is in agreement with discharge back to facility. Patient will be transported by EMS. RN to call report and call for transport.    Employment status:  Retired Forensic scientist:  Medicare PT Recommendations:  Not assessed at this time Information / Referral to community resources:     Patient/Family's Response to care:  Patient thanked CSW for assistance   Patient/Family's Understanding of and  Emotional Response to Diagnosis, Current Treatment, and Prognosis:  Patient in agreement with discharge plan   Emotional Assessment Appearance:  Appears stated age Attitude/Demeanor/Rapport:    Affect (typically observed):  Accepting, Pleasant, Happy Orientation:  Oriented to Self, Oriented to Place, Oriented to  Time, Oriented to Situation Alcohol / Substance use:  Not Applicable Psych involvement (Current and /or in the community):  No (Comment)  Discharge Needs  Concerns to be addressed:  Discharge Planning Concerns Readmission within the last 30 days:  No Current discharge risk:  None Barriers to Discharge:  Continued Medical Work up   [employer], [place] 04/24/2018, 11:12 AM

## 2018-04-24 NOTE — NC FL2 (Signed)
[name] LEVEL OF CARE SCREENING TOOL     IDENTIFICATION  Patient Name: Joel SEEHAFER Birthdate: [birth year] Sex: male Admission Date (Current Location): 04/22/2018  [place] and [place] Number:  Engineering geologist and Address:  [facility], [address] [place], [place] [address]      Provider Number: [id number]  Attending Physician Name and Address:  Gladstone Lighter, MD  Relative Name and Phone Number:       Current Level of Care: Hospital Recommended Level of Care: [place] Prior Approval Number:    Date Approved/Denied:   PASRR Number:    Discharge Plan: Domiciliary (Rest home)    Current Diagnoses: Patient Active Problem List   Diagnosis Date Noted  . HCAP (healthcare-associated pneumonia) 04/22/2018  . Acute on chronic respiratory failure with hypoxia ([place]) 12/21/2017  . Tobacco use 10/07/2017  . Apnea   . End-stage renal disease on hemodialysis ([place])   . Hypervolemia   . Altered mental status   . Acute on chronic systolic CHF (congestive heart failure) ([place]) 01/18/2017  . Pulmonary embolism ([place]) 08/03/2016  . NSTEMI (non-ST elevated myocardial infarction) ([place]) 07/26/2016  . PVD (peripheral vascular disease) ([place]) 03/05/2016  . Preop examination 05/02/2015  . Chronic systolic heart failure ([place])   . Hyperkalemia 01/13/2015  . Normocytic anemia 05/04/2014  . Hyperlipidemia 04/26/2010  . HYPERTENSION, BENIGN 04/26/2010  . CAD, NATIVE VESSEL 04/26/2010    Orientation RESPIRATION BLADDER Height & Weight     Self, Time, Place  O2(2.5 liters) Continent Weight: 208 lb 8.9 oz (94.6 kg) Height:  6\' 2"  (188 cm)  BEHAVIORAL SYMPTOMS/MOOD NEUROLOGICAL BOWEL NUTRITION STATUS  (none) (none) Continent Diet(RenaL Diet with fluid restriction 1210ml )  AMBULATORY STATUS COMMUNICATION OF NEEDS Skin   Limited Assist Verbally Normal                       Personal Care Assistance Level of  Assistance  Bathing, Feeding, Dressing Bathing Assistance: Limited assistance Feeding assistance: Independent Dressing Assistance: Independent     Functional Limitations Info  Sight, Hearing, Speech Sight Info: Adequate Hearing Info: Adequate Speech Info: Adequate    SPECIAL CARE FACTORS FREQUENCY                       Contractures Contractures Info: Not present    Additional Factors Info  Code Status, Allergies Code Status Info: Full Code Allergies Info: Dust Mite Extract           Current Medications (04/24/2018):  This is the current hospital active medication list Current Facility-Administered Medications  Medication Dose Route Frequency Provider Last Rate Last Dose  . 0.9 %  sodium chloride infusion   Intravenous PRN Salary, Montell D, MD      . acetaminophen (TYLENOL) tablet 500 mg  500 mg Oral Q6H PRN Salary, Montell D, MD      . albuterol (PROVENTIL) (2.5 MG/3ML) 0.083% nebulizer solution 3 mL  3 mL Inhalation Q6H PRN Salary, Montell D, MD      . apixaban (ELIQUIS) tablet 5 mg  5 mg Oral BID Salary, Montell D, MD   5 mg at 04/24/18 0920  . aspirin EC tablet 325 mg  325 mg Oral Daily Salary, Montell D, MD   325 mg at 04/24/18 0920  . atorvastatin (LIPITOR) tablet 10 mg  10 mg Oral QHS Salary, Holly Bodily D, MD   10 mg at 04/23/18 2104  . calcium  acetate (PHOSLO) capsule 667 mg  667 mg Oral Daily Salary, Holly Bodily D, MD   667 mg at 04/24/18 0919  . carvedilol (COREG) tablet 6.25 mg  6.25 mg Oral BID Salary, Montell D, MD   6.25 mg at 04/23/18 1024  . ceFEPIme (MAXIPIME) 1 g in sodium chloride 0.9 % 100 mL IVPB  1 g Intravenous Q24H Dallie Piles, RPH      . Chlorhexidine Gluconate Cloth 2 % PADS 6 each  6 each Topical Q0600 Murlean Iba, MD   6 each at 04/24/18 0625  . chlorpheniramine-HYDROcodone (TUSSIONEX) 10-8 MG/5ML suspension 5 mL  5 mL Oral Q12H Gladstone Lighter, MD   5 mL at 04/24/18 1012  . docusate sodium (COLACE) capsule 100 mg  100 mg Oral Daily PRN  Loney Hering D, MD   100 mg at 04/22/18 2341  . erythromycin ophthalmic ointment 1 application  1 application Right Eye QHS Salary, Avel Peace, MD   1 application at 94/70/96 2259  . famotidine (PEPCID) tablet 20 mg  20 mg Oral Daily Gladstone Lighter, MD   20 mg at 04/24/18 1011  . fluticasone (FLONASE) 50 MCG/ACT nasal spray 1 spray  1 spray Each Nare Daily Salary, Montell D, MD   1 spray at 04/24/18 0920  . fluticasone furoate-vilanterol (BREO ELLIPTA) 200-25 MCG/INH 1 puff  1 puff Inhalation Daily Salary, Montell D, MD   1 puff at 04/24/18 0918  . guaiFENesin (MUCINEX) 12 hr tablet 600 mg  600 mg Oral BID Gladstone Lighter, MD   600 mg at 04/24/18 2836  . guaiFENesin (ROBITUSSIN) 100 MG/5ML solution 200 mg  200 mg Oral TID PRN Salary, Montell D, MD      . hydrocortisone 2.5 % cream 1 application  1 application Topical BID PRN Salary, Montell D, MD      . ipratropium-albuterol (DUONEB) 0.5-2.5 (3) MG/3ML nebulizer solution 3 mL  3 mL Inhalation Q6H PRN Salary, Montell D, MD      . isosorbide mononitrate (IMDUR) 24 hr tablet 30 mg  30 mg Oral Daily Salary, Montell D, MD   30 mg at 04/24/18 1012  . lidocaine (XYLOCAINE) 2 % viscous mouth solution 15 mL  15 mL Mouth/Throat Q3H PRN Merlyn Lot, MD   15 mL at 04/22/18 1451  . loratadine (CLARITIN) tablet 10 mg  10 mg Oral Daily Salary, Montell D, MD   10 mg at 04/24/18 0920  . methylPREDNISolone sodium succinate (SOLU-MEDROL) 125 mg/2 mL injection 60 mg  60 mg Intravenous Q24H Gladstone Lighter, MD   60 mg at 04/24/18 1012  . midodrine (PROAMATINE) tablet 5 mg  5 mg Oral TID WC Salary, Montell D, MD   5 mg at 04/24/18 0919  . multivitamin with minerals tablet 1 tablet  1 tablet Oral BID Loney Hering D, MD   1 tablet at 04/24/18 0919  . nitroGLYCERIN (NITROSTAT) SL tablet 0.4 mg  0.4 mg Sublingual Q5 min PRN Salary, Montell D, MD      . pantoprazole (PROTONIX) EC tablet 20 mg  20 mg Oral Daily Salary, Montell D, MD   20 mg at 04/24/18 0919   . polyethylene glycol (MIRALAX / GLYCOLAX) packet 17 g  17 g Oral Daily PRN Salary, Montell D, MD      . pregabalin (LYRICA) capsule 75 mg  75 mg Oral Daily Salary, Montell D, MD   75 mg at 04/23/18 2103  . promethazine (PHENERGAN) injection 12.5 mg  12.5 mg Intravenous Q6H PRN Merlyn Lot,  MD   12.5 mg at 04/22/18 1320  . sevelamer carbonate (RENVELA) tablet 800 mg  800 mg Oral TID WC Salary, Montell D, MD   800 mg at 04/24/18 0919  . tiotropium (SPIRIVA) inhalation capsule ([facility] use ONLY) 18 mcg  18 mcg Inhalation Daily Salary, Holly Bodily D, MD   18 mcg at 04/23/18 2114  . vancomycin (VANCOCIN) IVPB 1000 mg/200 mL premix  1,000 mg Intravenous Q M,W,F-HD Dallie Piles, [facility]         Discharge Medications: Please see discharge summary for a list of discharge medications.  Relevant Imaging Results:  Relevant Lab Results:   Additional Information    Ofelia Podolski  Louretta Shorten, LCSWA

## 2018-04-24 NOTE — Discharge Summary (Signed)
[facility] NAME: [name]    MR#:  [id number]  DATE OF BIRTH:  09-06-1955  DATE OF ADMISSION:  04/22/2018   ADMITTING PHYSICIAN: Gorden Harms, MD  DATE OF DISCHARGE:  04/24/18  PRIMARY CARE PHYSICIAN: Donnie Coffin, MD   ADMISSION DIAGNOSIS:   Shortness of breath [R06.02] ESRD on dialysis ([place]) [N18.6, Z99.2] Acute on chronic respiratory failure with hypoxia (HCC) [J96.21] Pneumonia due to infectious organism, unspecified laterality, unspecified part of lung [J18.9]  DISCHARGE DIAGNOSIS:   Active Problems:   HCAP (healthcare-associated pneumonia)   SECONDARY DIAGNOSIS:   Past Medical History:  Diagnosis Date  . 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 Amputee, below knee, left ([place])   . Anemia   . Asthma   . Cardiomyopathy ([place])   . CHF (congestive heart failure) ([place])   . Chronic systolic heart failure ([place])   . Complication of anesthesia    hypotension  . COPD (chronic obstructive pulmonary disease) ([place])   . Coronary artery disease   . Dialysis patient ([place])    Mon, Wed, Fri  . End stage renal disease ([place])   . GERD (gastroesophageal reflux disease)   . Headache   . History of kidney stones   . History of pulmonary embolism   . HLD (hyperlipidemia)   . HTN (hypertension)   . Hyperparathyroidism   . Myocardial infarction ([place])   . Peripheral vascular disease ([place])   . Shortness of breath dyspnea   . Sleep apnea    NO C-PAP, Patient stated in process of  "getting one"   . Tobacco dependence     HOSPITAL COURSE:   62 year old male with multiple medical problems including end-stage renal disease on Monday Wednesday Friday hemodialysis, diastolic CHF, COPD on as needed home oxygen, peripheral vascular disease status post amputations, GERD presents to hospital secondary to worsening cough and breathing  1.  Healthcare acquired pneumonia- blood cultures are negative -On 2 L  oxygen.  Patient states he is on oxygen at the assisted living. -Bronchodilators and cough meds -on vancomycin and cefepime.  MRSA PCR is positive -And is afebrile now.  Will change antibiotics to cefpodoxime at discharge. -Also has some reactive airway disease with underlying COPD.  1 dose of Solu-Medrol today and discharged on prednisone for 5 days. -Repeat chest x-ray today with cardiomegaly and pulmonary edema  2.  End-stage renal disease on hemodialysis-presenting with pulmonary edema secondary to noncardiac causes -Plan for dialysis today per schedule -Appreciate nephrology consult.  3.  Hypertension-on Imdur and carvedilol.  Also takes Midodrine with meals  4.  History of pulmonary embolism-continue Eliquis  5.  GERD-on Protonix  Patient is from assisted living facility-[facility] for  discharge today after dialysis  DISCHARGE CONDITIONS:   Guarded  CONSULTS OBTAINED:   Treatment Team:  Murlean Iba, MD  DRUG ALLERGIES:   Allergies  Allergen Reactions  . Dust Mite Extract Other (See Comments)    Reaction: unknown   DISCHARGE MEDICATIONS:   Allergies as of 04/24/2018      Reactions   Dust Mite Extract Other (See Comments)   Reaction: unknown      Medication List    TAKE these medications   acetaminophen 500 MG tablet Commonly known as:  TYLENOL Take 500 mg by mouth every 6 (six) hours as needed for mild pain.   albuterol 108 (90 Base) MCG/ACT inhaler Commonly known as:  PROVENTIL HFA;VENTOLIN HFA Inhale 2 puffs into the lungs every 6 (six) hours as needed for wheezing or shortness of breath. What changed:  Another medication with the same name was added. Make sure you understand how and when to take each.   albuterol (2.5 MG/3ML) 0.083% nebulizer solution Commonly known as:  PROVENTIL Take 3 mLs (2.5 mg total) by nebulization every 6 (six) hours as needed for wheezing or shortness of breath. What changed:  You were already  taking a medication with the same name, and this prescription was added. Make sure you understand how and when to take each.   apixaban 5 MG Tabs tablet Commonly known as:  ELIQUIS Take 1 tablet (5 mg total) by mouth 2 (two) times daily.   aspirin EC 325 MG tablet Take 325 mg by mouth daily.   atorvastatin 10 MG tablet Commonly known as:  LIPITOR Take 10 mg by mouth at bedtime.   budesonide-formoterol 160-4.5 MCG/ACT inhaler Commonly known as:  SYMBICORT Inhale 2 puffs into the lungs 2 (two) times daily.   calcium acetate 667 MG capsule Commonly known as:  PHOSLO Take 667 mg by mouth daily.   carvedilol 6.25 MG tablet Commonly known as:  COREG Take 1 tablet (6.25 mg total) by mouth 2 (two) times daily.   cefpodoxime 200 MG tablet Commonly known as:  VANTIN Take 1 tablet (200 mg total) by mouth every Monday, Wednesday, and Friday at 8 PM for 10 days.   cetirizine 10 MG tablet Commonly known as:  ZYRTEC Take 10 mg by mouth daily.   chlorpheniramine-HYDROcodone 10-8 MG/5ML Suer Commonly known as:  TUSSIONEX Take 5 mLs by mouth every 12 (twelve) hours for 7 days.   COMBIVENT RESPIMAT 20-100 MCG/ACT Aers respimat Generic drug:  Ipratropium-Albuterol Inhale 1 puff into the lungs every 6 (six) hours as needed for wheezing or shortness of breath.   docusate sodium 100 MG capsule Commonly known as:  COLACE Take 100 mg by mouth daily as needed for mild constipation or moderate constipation.   erythromycin ophthalmic ointment Place 1 application into the right eye at bedtime.   fluticasone 50 MCG/ACT nasal spray Commonly known as:  FLONASE Place 1 spray into both nostrils daily.   hydrocortisone 2.5 % cream Apply 1 application topically 2 (two) times daily as needed (itching).   isosorbide mononitrate 30 MG 24 hr tablet Commonly known as:  IMDUR Take 30 mg by mouth daily.   midodrine 10 MG tablet Commonly known as:  PROAMATINE Take 5 mg by mouth daily as needed.     nitroGLYCERIN 0.4 MG SL tablet Commonly known as:  NITROSTAT Place 0.4 mg under the tongue every 5 (five) minutes as needed for chest pain.   pantoprazole 20 MG tablet Commonly known as:  PROTONIX Take 20 mg by mouth daily.   polyethylene glycol packet Commonly known as:  MIRALAX / GLYCOLAX Take 17 g by mouth daily as needed for mild constipation.   predniSONE 20 MG tablet Commonly known as:  DELTASONE Take 2 tablets (40 mg total) by mouth daily for 5 days. Start taking on:  04/25/2018   pregabalin 75 MG capsule Commonly known as:  LYRICA Take 1 capsule (75 mg total) by mouth daily.   ROBAFEN 100 MG/5ML syrup Generic drug:  guaifenesin Take 200 mg by mouth 3 (three) times daily as needed for cough.   sevelamer carbonate 800 MG tablet Commonly known as:  RENVELA Take 800 mg by mouth 3 (three) times daily with meals.  THERA-M PO Take 1 tablet by mouth 2 (two) times daily.   tiotropium 18 MCG inhalation capsule Commonly known as:  SPIRIVA Place 18 mcg into inhaler and inhale daily.        DISCHARGE INSTRUCTIONS:   1. PCP f/u in 1 week 2. Dialysis per schedule  DIET:   Renal diet  ACTIVITY:   Activity as tolerated  OXYGEN:   Home Oxygen: Yes.    Oxygen Delivery: 2 liters/min via Patient connected to nasal cannula oxygen  DISCHARGE LOCATION:   Assisted Living    If you experience worsening of your admission symptoms, develop shortness of breath, life threatening emergency, suicidal or homicidal thoughts you must seek medical attention immediately by calling 911 or calling your MD immediately  if symptoms less severe.  You Must read complete instructions/literature along with all the possible adverse reactions/side effects for all the Medicines you take and that have been prescribed to you. Take any new Medicines after you have completely understood and accpet all the possible adverse reactions/side effects.   Please note  You were cared for by a  hospitalist during your hospital stay. If you have any questions about your discharge medications or the care you received while you were in the hospital after you are discharged, you can call the unit and asked to speak with the hospitalist on call if the hospitalist that took care of you is not available. Once you are discharged, your primary care physician will handle any further medical issues. Please note that NO REFILLS for any discharge medications will be authorized once you are discharged, as it is imperative that you return to your primary care physician (or establish a relationship with a primary care physician if you do not have one) for your aftercare needs so that they can reassess your need for medications and monitor your lab values.    On the day of Discharge:  VITAL SIGNS:   Blood pressure 99/70, pulse 70, temperature 98.5 F (36.9 C), temperature source Oral, resp. rate 18, height 6\' 2"  (1.88 m), weight 94.6 kg, SpO2 97 %.  PHYSICAL EXAMINATION:    GENERAL:  62 y.o.-year-old patient lying in the bed with no acute distress.  EYES: Pupils equal, round, reactive to light and accommodation. No scleral icterus. Extraocular muscles intact.  HEENT: Head atraumatic, normocephalic. Oropharynx and nasopharynx clear.  NECK:  Supple, no jugular venous distention. No thyroid enlargement, no tenderness.  LUNGS: Moving air bilaterally, however has diffuse scattered expiratory wheezing, no rales,rhonchi or crepitation. No use of accessory muscles of respiration.  CARDIOVASCULAR: S1, S2 normal. No  rubs, or gallops.  2/6 systolic murmur is present ABDOMEN: Soft, nontender, nondistended. Bowel sounds present. No organomegaly or mass.  EXTREMITIES: Has left upper arm AV fistula.  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   NEUROLOGIC: Cranial nerves II through XII are intact. Muscle strength 5/5 in all extremities. Sensation intact. Gait not  checked.  PSYCHIATRIC: The patient is alert and oriented x 3.  SKIN: No obvious rash, lesion, or ulcer. [name]   DATA REVIEW:   CBC Recent Labs  Lab 04/22/18 1216  WBC 7.2  HGB 12.1*  HCT 37.7*  PLT 174    Chemistries  Recent Labs  Lab 04/22/18 1216 04/24/18 0409  NA 137 138  K 3.9 4.2  CL 96* 99  CO2 31 28  GLUCOSE 84 106*  BUN 44* 56*  CREATININE 7.21* 7.83*  CALCIUM 9.1 8.8*  AST 40  --   ALT 32  --   ALKPHOS 144*  --   BILITOT 0.9  --      Microbiology Results  Results for orders placed or performed during the hospital encounter of 04/22/18  Blood culture (routine x 2)     Status: None (Preliminary result)   Collection Time: 04/22/18  2:33 PM  Result Value Ref Range Status   Specimen Description BLOOD BLOOD RIGHT WRIST  Final   Special Requests   Final    BOTTLES DRAWN AEROBIC AND ANAEROBIC Blood Culture results may not be optimal due to an inadequate volume of blood received in culture bottles   Culture   Final    NO GROWTH 2 DAYS Performed at [facility], [address] [place], [place] [address]    Report Status PENDING  Incomplete  Culture, blood (routine x 2) Call MD if unable to obtain prior to antibiotics being given     Status: None (Preliminary result)   Collection Time: 04/22/18  3:36 PM  Result Value Ref Range Status   Specimen Description BLOOD RIGHT WRIST  Final   Special Requests   Final    BOTTLES DRAWN AEROBIC AND ANAEROBIC Blood Culture results may not be optimal due to an inadequate volume of blood received in culture bottles   Culture   Final    NO GROWTH 2 DAYS Performed at [facility], [address], [place], [place] [address]    Report Status PENDING  Incomplete  MRSA PCR Screening     Status: Abnormal   Collection Time: 04/23/18  4:26 AM  Result Value Ref Range Status   MRSA by PCR POSITIVE (A) NEGATIVE Final    Comment:        The GeneXpert MRSA Assay (FDA approved for NASAL specimens only), is one  component of a comprehensive MRSA colonization surveillance program. It is not intended to diagnose MRSA infection nor to guide or monitor treatment for MRSA infections. RESULT CALLED TO, READ BACK BY AND VERIFIED WITH: JEANETTE BRILEY ON 04/23/18 AT 0601 QSD Performed at [facility], [address] [place], [place] [address]     RADIOLOGY:  Dg Chest 2 View  Result Date: 04/24/2018 CLINICAL DATA:  Patient with history of pneumonia. Instead renal disease. EXAM: CHEST - 2 VIEW COMPARISON:  Chest radiograph 04/22/2018 FINDINGS: Monitoring leads overlie the patient. Stable cardiomegaly. Diffuse bilateral interstitial pulmonary opacities. Improved aeration left lung base. Small bilateral pleural effusions. Thoracic spine degenerative changes. IMPRESSION: Cardiomegaly with mild interstitial edema. Electronically Signed   By: Lovey Newcomer M.D.   On: 04/24/2018 08:20     Management plans discussed with the patient, family and they are in agreement.  CODE STATUS:     Code Status Orders  (From admission, onward)         Start     Ordered   04/22/18 1536  Full code  Continuous     04/22/18 1535        Code Status History    Date Active Date Inactive Code Status Order ID Comments User Context   12/21/2017 1105 12/23/2017 1713 Full Code [id number]  Henreitta Leber, MD Inpatient   09/18/2017 1335 09/19/2017 2041 Full Code [id number]  Bettey Costa, MD Inpatient   07/21/2017 1610 07/26/2017 0039 Full Code [id number]  Bettey Costa, MD Inpatient   06/22/2017 2145 06/23/2017 1805 Full Code [id number]  Henreitta Leber, MD Inpatient   04/07/2017 [phone]  04/10/2017 1828 Full Code [id number]  Eloise Levels, MD ED   01/19/2017 0145 01/21/2017 1941 Full Code [id number]  Lance Coon, MD Inpatient   11/11/2016 0421 11/14/2016 1908 Full Code [id number]  Harrie Foreman, MD Inpatient   10/28/2016 0756 10/30/2016 1930 Full Code [id number]  Demetrios Loll, MD ED   08/21/2016 0408 08/22/2016 1826 DNR [id number]   Harrie Foreman, MD Inpatient   08/03/2016 2319 08/07/2016 1928 DNR [id number]  Lance Coon, MD Inpatient   07/26/2016 1707 07/31/2016 2158 DNR [id number]  Vaughan Basta, MD Inpatient   05/22/2016 0034 05/24/2016 1911 Full Code [id number]  Harvie Bridge, DO Inpatient   01/13/2015 0144 01/14/2015 1446 Full Code [id number]  Juluis Mire, MD ED   12/15/2014 0932 12/15/2014 1431 Full Code [id number]  Algernon Huxley, MD Inpatient   05/06/2014 2029 05/10/2014 1723 Full Code [id number]  Elam Dutch, MD Inpatient   05/04/2014 2224 05/06/2014 2029 Full Code [id number]  Rise Patience, MD Inpatient    Advance Directive Documentation     Most Recent Value  Type of Advance Directive  Healthcare Power of Attorney [daughter- [employer]]  Pre-existing out of facility DNR order (yellow form or pink MOST form)  -  "MOST" Form in Place?  -      TOTAL TIME TAKING CARE OF THIS PATIENT: 38 minutes.    Gladstone Lighter M.D on 04/24/2018 at 10:02 AM  Between 7am to 6pm - Pager - [phone]  After 6pm go to [email or web address] - Technical [employer] [facility]  Office  [phone]  CC: Primary care physician; Donnie Coffin, MD   Note: This dictation was prepared with Dragon dictation along with smaller phrase technology. Any transcriptional errors that result from this process are unintentional.

## 2018-04-27 LAB — CULTURE, BLOOD (ROUTINE X 2)
CULTURE: NO GROWTH
Culture: NO GROWTH

## 2018-06-25 ENCOUNTER — Ambulatory Visit: Payer: Medicare Other | Admitting: Urology

## 2018-06-30 ENCOUNTER — Encounter: Payer: Medicare Other | Attending: Physician Assistant | Admitting: Physician Assistant

## 2018-06-30 DIAGNOSIS — Y835 Amputation of limb(s) as the cause of abnormal reaction of the patient, or of later complication, without mention of misadventure at the time of the procedure: Secondary | ICD-10-CM | POA: Insufficient documentation

## 2018-06-30 DIAGNOSIS — Z992 Dependence on renal dialysis: Secondary | ICD-10-CM | POA: Insufficient documentation

## 2018-06-30 DIAGNOSIS — E1140 Type 2 diabetes mellitus with diabetic neuropathy, unspecified: Secondary | ICD-10-CM | POA: Insufficient documentation

## 2018-06-30 DIAGNOSIS — J449 Chronic obstructive pulmonary disease, unspecified: Secondary | ICD-10-CM | POA: Diagnosis not present

## 2018-06-30 DIAGNOSIS — E1122 Type 2 diabetes mellitus with diabetic chronic kidney disease: Secondary | ICD-10-CM | POA: Insufficient documentation

## 2018-06-30 DIAGNOSIS — Z89431 Acquired absence of right foot: Secondary | ICD-10-CM | POA: Insufficient documentation

## 2018-06-30 DIAGNOSIS — N186 End stage renal disease: Secondary | ICD-10-CM | POA: Insufficient documentation

## 2018-06-30 DIAGNOSIS — I25119 Atherosclerotic heart disease of native coronary artery with unspecified angina pectoris: Secondary | ICD-10-CM | POA: Insufficient documentation

## 2018-06-30 DIAGNOSIS — I5042 Chronic combined systolic (congestive) and diastolic (congestive) heart failure: Secondary | ICD-10-CM | POA: Diagnosis not present

## 2018-06-30 DIAGNOSIS — T8789 Other complications of amputation stump: Secondary | ICD-10-CM | POA: Diagnosis present

## 2018-06-30 DIAGNOSIS — F17210 Nicotine dependence, cigarettes, uncomplicated: Secondary | ICD-10-CM | POA: Diagnosis not present

## 2018-06-30 DIAGNOSIS — G4730 Sleep apnea, unspecified: Secondary | ICD-10-CM | POA: Insufficient documentation

## 2018-06-30 DIAGNOSIS — L98492 Non-pressure chronic ulcer of skin of other sites with fat layer exposed: Secondary | ICD-10-CM | POA: Insufficient documentation

## 2018-06-30 DIAGNOSIS — Z89512 Acquired absence of left leg below knee: Secondary | ICD-10-CM | POA: Insufficient documentation

## 2018-06-30 DIAGNOSIS — N4889 Other specified disorders of penis: Secondary | ICD-10-CM | POA: Diagnosis not present

## 2018-06-30 DIAGNOSIS — I132 Hypertensive heart and chronic kidney disease with heart failure and with stage 5 chronic kidney disease, or end stage renal disease: Secondary | ICD-10-CM | POA: Diagnosis not present

## 2018-06-30 DIAGNOSIS — I429 Cardiomyopathy, unspecified: Secondary | ICD-10-CM | POA: Diagnosis not present

## 2018-07-01 NOTE — Progress Notes (Signed)
Welter, [name] ([id number]) Visit Report for 06/30/2018 Abuse/Suicide Risk Screen Details Patient Name: [name], [name]. Date of Service: 06/30/2018 10:15 AM Medical Record Number: [id number] Patient Account Number: [id number] Date of Birth/Sex: 1955/10/14 (63 y.o. M) Treating RN: Cornell Barman Primary Care Tal Kempker: Tomasa Hose Other Clinician: Referring [place]: Odessa Fleming Treating Reem Fleury/Extender: Melburn Hake, HOYT Weeks in Treatment: 0 Abuse/Suicide Risk Screen Items Answer ABUSE/SUICIDE RISK SCREEN: Has anyone close to you tried to hurt or harm you recentlyo No Do you feel uncomfortable with anyone in your familyo No Has anyone forced you do things that you didnot want to doo No Do you have any thoughts of harming yourselfo No Patient displays signs or symptoms of abuse and/or neglect. No Electronic Signature(s) Signed: 06/30/2018 5:09:39 PM By: Gretta Cool, BSN, RN, CWS, Kim RN, BSN Entered By: Gretta Cool, BSN, RN, CWS, Kim on 06/30/2018 10:42:09 Pruitt, Joel Punt ([id number]) -------------------------------------------------------------------------------- Activities of Daily Living Details Patient Name: [name], [name] Date of Service: 06/30/2018 10:15 AM Medical Record Number: [id number] Patient Account Number: [id number] Date of Birth/Sex: Mar 15, 1956 (63 y.o. M) Treating RN: Cornell Barman Primary Care Lydia Meng: Tomasa Hose Other Clinician: Referring Dasha Kawabata: Odessa Fleming Treating Oluwatimilehin Balfour/Extender: Melburn Hake, HOYT Weeks in Treatment: 0 Activities of Daily Living Items Answer Activities of Daily Living (Please select one for each item) Drive Automobile Completely Able Take Medications Need Assistance Use Telephone Completely Able Care for Appearance Completely Able Use Toilet Completely Able Bath / Shower Completely Able Dress Self Completely Able Feed Self Completely Able Walk Completely Able Get In / Out Bed Completely Able Housework Completely Able Prepare Meals  Completely Able Handle Money Completely Able Shop for Self Completely Able Electronic Signature(s) Signed: 06/30/2018 5:09:39 PM By: Gretta Cool, BSN, RN, CWS, Kim RN, BSN Entered By: Gretta Cool, BSN, RN, CWS, Kim on 06/30/2018 10:42:43 Lonzo, Joel Punt ([id number]) -------------------------------------------------------------------------------- Education Assessment Details Patient Name: [name], [name] Date of Service: 06/30/2018 10:15 AM Medical Record Number: [id number] Patient Account Number: [id number] Date of Birth/Sex: [birth year] (63 y.o. M) Treating RN: Cornell Barman Primary Care Rylie Limburg: Tomasa Hose Other Clinician: Referring Tirso Laws: Odessa Fleming Treating Mckenlee Mangham/Extender: Melburn Hake, HOYT Weeks in Treatment: 0 Primary Learner Assessed: Patient Learning Preferences/Education Level/Primary Language Learning Preference: Explanation, Demonstration Highest Education Level: High School Preferred Language: English Cognitive Barrier Assessment/Beliefs Language Barrier: No Translator Needed: No Memory Deficit: No Emotional Barrier: No Cultural/Religious Beliefs Affecting Medical Care: No Physical Barrier Assessment Impaired Vision: No Impaired Hearing: No Decreased Hand dexterity: No Knowledge/Comprehension Assessment Knowledge Level: High Comprehension Level: High Ability to understand written High instructions: Ability to understand verbal High instructions: Motivation Assessment Anxiety Level: Calm Cooperation: Cooperative Education Importance: Acknowledges Need Interest in Health Problems: Asks Questions Perception: Coherent Willingness to Engage in Self- High Management Activities: Readiness to Engage in Self- High Management Activities: Electronic Signature(s) Signed: 06/30/2018 5:09:39 PM By: Gretta Cool, BSN, RN, CWS, Kim RN, BSN Entered By: Gretta Cool, BSN, RN, CWS, Kim on 06/30/2018 10:43:12 Jost, Joel Punt  ([id number]) -------------------------------------------------------------------------------- Fall Risk Assessment Details Patient Name: [name], Joel Punt. Date of Service: 06/30/2018 10:15 AM Medical Record Number: [id number] Patient Account Number: [id number] Date of Birth/Sex: 1955/09/26 (63 y.o. M) Treating RN: Cornell Barman Primary Care Kierrah Kilbride: Tomasa Hose Other Clinician: Referring Kirk Basquez: Odessa Fleming Treating Lavon Horn/Extender: Melburn Hake, HOYT Weeks in Treatment: 0 Fall Risk Assessment Items Have you had 2 or more falls in the last 12 monthso 0 No Have you had any fall that resulted in injury in the last 12 monthso 0 No FALL RISK ASSESSMENT: History of  falling - immediate or within 3 months 0 No Secondary diagnosis 0 No Ambulatory aid None/bed rest/wheelchair/nurse 0 Yes Crutches/cane/walker 0 No Furniture 0 No IV Access/Saline Lock 0 No Gait/Training Normal/bed rest/immobile 0 Yes Weak 0 No Impaired 0 No Mental Status Oriented to own ability 0 Yes Electronic Signature(s) Signed: 06/30/2018 5:09:39 PM By: Gretta Cool, BSN, RN, CWS, Kim RN, BSN Entered By: Gretta Cool, BSN, RN, CWS, Kim on 06/30/2018 10:43:29 Samaan, Joel Punt ([id number]) -------------------------------------------------------------------------------- Foot Assessment Details Patient Name: [name], [name] Date of Service: 06/30/2018 10:15 AM Medical Record Number: [id number] Patient Account Number: [id number] Date of Birth/Sex: 1955-10-22 (63 y.o. M) Treating RN: Cornell Barman Primary Care Genni Buske: Tomasa Hose Other Clinician: Referring Glennys Schorsch: Odessa Fleming Treating Jayleah Garbers/Extender: Melburn Hake, HOYT Weeks in Treatment: 0 Foot Assessment Items [x]  Unable to perform left foot assessment due to amputation Site Locations + = Sensation present, - = Sensation absent, C = Callus, U = Ulcer R = Redness, W = Warmth, M = Maceration, PU = Pre-ulcerative lesion F = Fissure, S = Swelling, D = Dryness Assessment Right:  Left: Other Deformity: No Prior Foot Ulcer: No Prior Amputation: No Charcot Joint: No Ambulatory Status: Non-ambulatory Assistance Device: Wheelchair Gait: Engineer, maintenance) Signed: 06/30/2018 5:09:39 PM By: Gretta Cool, BSN, RN, CWS, Kim RN, BSN Entered By: Gretta Cool, BSN, RN, CWS, Kim on 06/30/2018 10:44:18 Limbaugh, Joel Punt ([id number]) -------------------------------------------------------------------------------- Nutrition Risk Assessment Details Patient Name: Massaro, Tyric N. Date of Service: 06/30/2018 10:15 AM Medical Record Number: [id number] Patient Account Number: [id number] Date of Birth/Sex: [birth year] (63 y.o. M) Treating RN: Cornell Barman Primary Care Kierre Hintz: Tomasa Hose Other Clinician: Referring Wolf Boulay: Odessa Fleming Treating Jordanne Elsbury/Extender: Melburn Hake, HOYT Weeks in Treatment: 0 Height (in): 74 Weight (lbs): 210 Body Mass Index (BMI): 27 Nutrition Risk Assessment Items NUTRITION RISK SCREEN: I have an illness or condition that made me change the kind and/or amount of 0 No food I eat I eat fewer than two meals per day 0 No I eat few fruits and vegetables, or milk products 2 Yes I have three or more drinks of beer, liquor or wine almost every day 0 No I have tooth or mouth problems that make it hard for me to eat 0 No I don't always have enough money to buy the food I need 0 No I eat alone most of the time 0 No I take three or more different prescribed or over-the-counter drugs a day 1 Yes Without wanting to, I have lost or gained 10 pounds in the last six months 0 No I am not always physically able to shop, cook and/or feed myself 0 No Nutrition Protocols Good Risk Protocol Provide education on Moderate Risk Protocol 0 nutrition Electronic Signature(s) Signed: 06/30/2018 5:09:39 PM By: Gretta Cool, BSN, RN, CWS, Kim RN, BSN Entered By: Gretta Cool, BSN, RN, CWS, Kim on 06/30/2018 10:43:57

## 2018-07-01 NOTE — Progress Notes (Signed)
[name] ([id number]) Visit Report for 06/30/2018 Allergy List Details Patient Name: [name], [name]. Date of Service: 06/30/2018 10:15 AM Medical Record Number: [id number] Patient Account Number: [id number] Date of Birth/Sex: [birth year] (63 y.o. M) Treating RN: [name] Primary Care Yashar Inclan: [name] Other Clinician: Referring Maclaine Ahola: [name] Treating Braden Cimo/Extender: [name], [name] in Treatment: 0 Allergies Active Allergies No Known Allergies Allergy Notes Electronic Signature(s) Signed: 06/30/2018 5:09:39 PM By: [name], BSN, RN, CWS, [name] Entered By: [name] on 06/30/2018 10:36:51 [name] ([id number]) -------------------------------------------------------------------------------- Arrival Information Details Patient Name: [name], [name] Date of Service: 06/30/2018 10:15 AM Medical Record Number: [id number] Patient Account Number: [id number] Date of Birth/Sex: [birth year] (63 y.o. M) Treating RN: [name] Primary Care Anwyn Kriegel: [name] Other Clinician: Referring Siena Poehler: [name] Treating Ketura Sirek/Extender: [name], [name] in Treatment: 0 Visit Information Patient Arrived: Wheel Chair Arrival Time: 10:33 Accompanied By: caregiver Transfer Assistance: Manual Patient Identification Verified: Yes Secondary Verification Process Yes Completed: Patient Has Alerts: Yes Patient Alerts: Patient on Blood Thinner NOT DIABETIC Eliquis History Since Last Visit Added or deleted any medications: No Any new allergies or adverse reactions: No Electronic Signature(s) Signed: 06/30/2018 5:09:39 PM By: [name], BSN, RN, CWS, [name] Entered By: [name] on 06/30/2018 10:38:23 [name] ([id number]) -------------------------------------------------------------------------------- Clinic Level of Care Assessment Details Patient Name: [name], [name] Date of Service: 06/30/2018 10:15  AM Medical Record Number: [id number] Patient Account Number: [id number] Date of Birth/Sex: [birth year] (63 y.o. M) Treating RN: [name] Primary Care Jenicka Coxe: [name] Other Clinician: Referring Melven Stockard: [name] Treating Shyana Kulakowski/Extender: [name], [name] in Treatment: 0 Clinic Level of Care Assessment Items TOOL 2 Quantity Score []  - Use when only an EandM is performed on the INITIAL visit 0 ASSESSMENTS - Nursing Assessment / Reassessment X - General Physical Exam (combine w/ comprehensive assessment (listed just below) when 1 20 performed on new pt. evals) X- 1 25 Comprehensive Assessment (HX, ROS, Risk Assessments, Wounds Hx, etc.) ASSESSMENTS - Wound and Skin Assessment / Reassessment X - Simple Wound Assessment / Reassessment - one wound 1 5 []  - 0 Complex Wound Assessment / Reassessment - multiple wounds []  - 0 Dermatologic / Skin Assessment (not related to wound area) ASSESSMENTS - Ostomy and/or Continence Assessment and Care []  - Incontinence Assessment and Management 0 []  - 0 Ostomy Care Assessment and Management (repouching, etc.) PROCESS - Coordination of Care X - Simple Patient / Family Education for ongoing care 1 15 []  - 0 Complex (extensive) Patient / Family Education for ongoing care X- 1 10 Staff obtains Programmer, systems, Records, Test Results / Process Orders []  - 0 Staff telephones HHA, Nursing Homes / Clarify orders / etc []  - 0 Routine Transfer to another Facility (non-emergent condition) []  - 0 Routine Hospital Admission (non-emergent condition) X- 1 15 New Admissions / Biomedical engineer / Ordering NPWT, Apligraf, etc. []  - 0 Emergency Hospital Admission (emergent condition) X- 1 10 Simple Discharge Coordination []  - 0 Complex (extensive) Discharge Coordination PROCESS - Special Needs []  - Pediatric / Minor Patient Management 0 []  - 0 Isolation Patient Management [name], [name] ([id number]) []  - 0 Hearing / Language /  Visual special needs []  - 0 Assessment of Community assistance (transportation, D/C planning, etc.) []  - 0 Additional assistance / Altered mentation []  - 0 Support Surface(s) Assessment (bed, cushion, seat, etc.) INTERVENTIONS - Wound Cleansing / Measurement X - Wound Imaging (photographs -  any number of wounds) 1 5 []  - 0 Wound Tracing (instead of photographs) X- 1 5 Simple Wound Measurement - one wound []  - 0 Complex Wound Measurement - multiple wounds X- 1 5 Simple Wound Cleansing - one wound []  - 0 Complex Wound Cleansing - multiple wounds INTERVENTIONS - Wound Dressings X - Small Wound Dressing one or multiple wounds 1 10 []  - 0 Medium Wound Dressing one or multiple wounds []  - 0 Large Wound Dressing one or multiple wounds []  - 0 Application of Medications - injection INTERVENTIONS - Miscellaneous []  - External ear exam 0 []  - 0 Specimen Collection (cultures, biopsies, blood, body fluids, etc.) []  - 0 Specimen(s) / Culture(s) sent or taken to Lab for analysis []  - 0 Patient Transfer (multiple staff / Civil Service fast streamer / Similar devices) []  - 0 Simple Staple / Suture removal (25 or less) []  - 0 Complex Staple / Suture removal (26 or more) []  - 0 Hypo / Hyperglycemic Management (close monitor of Blood Glucose) []  - 0 Ankle / Brachial Index (ABI) - do not check if billed separately Has the patient been seen at the hospital within the last three years: Yes Total Score: 125 Level Of Care: New/Established - Level 4 Electronic Signature(s) Signed: 06/30/2018 4:52:45 PM By: [name] Entered By: [name] on 06/30/2018 11:20:28 [name] ([id number]) -------------------------------------------------------------------------------- Encounter Discharge Information Details Patient Name: [name], [name] Date of Service: 06/30/2018 10:15 AM Medical Record Number: [id number] Patient Account Number: [id number] Date of Birth/Sex: 06/16/1955 (63 y.o. M) Treating RN:  [name] Primary Care Luevenia Mcavoy: [name] Other Clinician: Referring Kourtney Terriquez: [name] Treating Eun Vermeer/Extender: [name], [name] in Treatment: 0 Encounter Discharge Information Items Discharge Condition: Stable Ambulatory Status: Wheelchair Discharge Destination: Home Transportation: Private Auto Accompanied By: caregiver Schedule Follow-up Appointment: Yes Clinical Summary of Care: Electronic Signature(s) Signed: 06/30/2018 4:52:45 PM By: [name] Entered By: [name] on 06/30/2018 11:23:55 Smoker, [name] ([id number]) -------------------------------------------------------------------------------- Lower Extremity Assessment Details Patient Name: [name], [name] Date of Service: 06/30/2018 10:15 AM Medical Record Number: [id number] Patient Account Number: [id number] Date of Birth/Sex: 04-05-1956 (63 y.o. M) Treating RN: [name] Primary Care Sherina Stammer: [name] Other Clinician: Referring Qusai Kem: [name] Treating Kannon Granderson/Extender: [name], [name] in Treatment: 0 Vascular Assessment Pulses: Posterior Tibial Popliteal Palpable: [Left:Yes] Extremity colors, hair growth, and conditions: Extremity Color: [Left:Hyperpigmented] Hair Growth on Extremity: [Left:No] Temperature of Extremity: [Left:Warm] Notes Patient has left BKA Electronic Signature(s) Signed: 06/30/2018 5:09:39 PM By: [name], BSN, RN, CWS, [name] Entered By: [name] on 06/30/2018 10:54:23 Heinz, [name] ([id number]) -------------------------------------------------------------------------------- Multi Wound Chart Details Patient Name: [name], [name] Date of Service: 06/30/2018 10:15 AM Medical Record Number: [id number] Patient Account Number: [id number] Date of Birth/Sex: 1955/10/05 (63 y.o. M) Treating RN: [name] Primary Care Raigan Baria: [name] Other Clinician: Referring  Ducre: [name] Treating  Allahna Husband/Extender: STONE III, [name] in Treatment: 0 Vital Signs Height(in): 74 Pulse(bpm): 64 Weight(lbs): 210 Blood Pressure(mmHg): 127/106 Body Mass Index(BMI): 27 Temperature(F): 97.8 Respiratory Rate 16 (breaths/min): Photos: [6:No Photos] [N/A:N/A] Wound Location: [6:Left Amputation Site - Below Knee - Midline] [N/A:N/A] Wounding Event: [6:Pressure Injury] [N/A:N/A] Primary Etiology: [6:Pressure Ulcer] [N/A:N/A] Comorbid History: [6:Anemia, Asthma, Chronic Obstructive Pulmonary Disease (COPD), Sleep Apnea, Congestive Heart Failure, Coronary Artery Disease, Hypertension, Myocardial Infarction, Peripheral Venous Disease, End Stage Renal Disease, Neuropathy]  [N/A:N/A] Date Acquired: [6:06/09/2018] [N/A:N/A] [name] of Treatment: [6:0] [N/A:N/A] Wound Status: [6:Open] [N/A:N/A] Measurements L x W x D [  6:3.2x3.7x0.1] [N/A:N/A] (cm) Area (cm) : [6:9.299] [N/A:N/A] Volume (cm) : [6:0.93] [N/A:N/A] % Reduction in Area: [6:0.00%] [N/A:N/A] % Reduction in Volume: [6:0.00%] [N/A:N/A] Classification: [6:Category/Stage III] [N/A:N/A] Exudate Amount: [6:Medium] [N/A:N/A] Exudate Type: [6:Purulent] [N/A:N/A] Exudate Color: [6:yellow, brown, green] [N/A:N/A] Wound Margin: [6:Flat and Intact] [N/A:N/A] Granulation Amount: [6:Small (1-33%)] [N/A:N/A] Granulation Quality: [6:Pink, Pale] [N/A:N/A] Necrotic Amount: [6:Medium (34-66%)] [N/A:N/A] Exposed Structures: [6:Fat Layer (Subcutaneous Tissue) Exposed: Yes Fascia: No Tendon: No Muscle: No] [N/A:N/A] Joint: No Bone: No Epithelialization: Small (1-33%) N/A N/A Periwound Skin Texture: Excoriation: No N/A N/A Induration: No Callus: No Crepitus: No Rash: No Scarring: No Periwound Skin Moisture: Maceration: No N/A N/A Dry/Scaly: No Periwound Skin Color: Atrophie Blanche: No N/A N/A Cyanosis: No Ecchymosis: No Erythema: No Hemosiderin Staining: No Mottled: No Pallor: No Rubor: No Tenderness on Palpation: No N/A  N/A Wound Preparation: Ulcer Cleansing: N/A N/A Rinsed/Irrigated with Saline Topical Anesthetic Applied: Other: lidocaine 4% Treatment Notes Electronic Signature(s) Signed: 06/30/2018 4:52:45 PM By: [name] Entered By: [name] on 06/30/2018 11:18:41 Headen, [name] ([id number]) -------------------------------------------------------------------------------- [place] Details Patient Name: [name], [name] Date of Service: 06/30/2018 10:15 AM Medical Record Number: [id number] Patient Account Number: [id number] Date of Birth/Sex: 03/15/1956 (63 y.o. M) Treating RN: [name] Primary Care Garet Hooton: [name] Other Clinician: Referring Kashonda Sarkisyan: [name] Treating Chrisa Hassan/Extender: [name], [name] in Treatment: 0 Active Inactive Abuse / Safety / Falls / Self Care Management Nursing Diagnoses: Impaired physical mobility Goals: Patient will not develop complications from immobility Date Initiated: 06/30/2018 Target Resolution Date: 09/25/2018 Goal Status: Active Interventions: Assess fall risk on admission and as needed Notes: Nutrition Nursing Diagnoses: Potential for alteratiion in Nutrition/Potential for imbalanced nutrition Goals: Patient/caregiver agrees to and verbalizes understanding of need to use nutritional supplements and/or vitamins as prescribed Date Initiated: 06/30/2018 Target Resolution Date: 09/25/2018 Goal Status: Active Interventions: Assess patient nutrition upon admission and as needed per policy Notes: Orientation to the Wound Care Program Nursing Diagnoses: Knowledge deficit related to the wound healing center program Goals: Patient/caregiver will verbalize understanding of the [facility] Date Initiated: 06/30/2018 Target Resolution Date: 09/26/2018 Goal Status: Active Interventions: Provide education on orientation to the wound center [name], [name] ([id number]) Notes: Wound/Skin  Impairment Nursing Diagnoses: Impaired tissue integrity Goals: Ulcer/skin breakdown will heal within 14 [name] Date Initiated: 06/30/2018 Target Resolution Date: 09/25/2018 Goal Status: Active Interventions: Assess patient/caregiver ability to obtain necessary supplies Assess patient/caregiver ability to perform ulcer/skin care regimen upon admission and as needed Assess ulceration(s) every visit Notes: Electronic Signature(s) Signed: 06/30/2018 4:52:45 PM By: [name] Entered By: [name] on 06/30/2018 11:18:19 [name], [name] ([id number]) -------------------------------------------------------------------------------- Pain Assessment Details Patient Name: [name], [name] Date of Service: 06/30/2018 10:15 AM Medical Record Number: [id number] Patient Account Number: [id number] Date of Birth/Sex: 08/16/1955 (63 y.o. M) Treating RN: [name] Primary Care Marcellene Shivley: [name] Other Clinician: Referring Bassheva Flury: [name] Treating Cherree Conerly/Extender: [name], [name] in Treatment: 0 Active Problems Location of Pain Severity and Description of Pain Patient Has Paino Yes Site Locations Pain Location: Generalized Pain, Pain in Ulcers Rate the pain. Current Pain Level: 4 Worst Pain Level: 10 Least Pain Level: 4 Character of Pain Describe the Pain: Aching, Heavy, Shooting Pain Management and Medication Current Pain Management: Medication: Yes Electronic Signature(s) Signed: 06/30/2018 5:09:39 PM By: [name], BSN, RN, CWS, [name] Entered By: [name] on 06/30/2018 10:35:49 [name], [name] ([id number]) -------------------------------------------------------------------------------- Patient/Caregiver Education Details Patient Name: Joel Larve  N. Date of Service: 06/30/2018 10:15 AM Medical Record Number: [id number] Patient Account Number: [id number] Date of Birth/Gender: 03-Jun-1955 (63 y.o. M) Treating RN: [name] Primary Care  Physician: [name] Other Clinician: Referring Physician: Odessa [name] Treating Physician/Extender: [name], [name] in Treatment: 0 Education Assessment Education Provided To: Patient Education Topics Provided Offloading: Handouts: Other: keep pressure off this area Methods: Explain/Verbal Responses: State content correctly Wound/Skin Impairment: Handouts: Other: wound care as ordered Methods: Demonstration, Explain/Verbal Responses: State content correctly Electronic Signature(s) Signed: 06/30/2018 4:52:45 PM By: [name] Entered By: [name] on 06/30/2018 11:21:09 [name], [name] ([id number]) -------------------------------------------------------------------------------- Wound Assessment Details Patient Name: [name], [name] Date of Service: 06/30/2018 10:15 AM Medical Record Number: [id number] Patient Account Number: [id number] Date of Birth/Sex: 22-Jun-1955 (63 y.o. M) Treating RN: [name] Primary Care Lakeyia Surber: [name] Other Clinician: Referring Tobin Witucki: [name] Treating Yordin Rhoda/Extender: [name], [name] in Treatment: 0 Wound Status Wound Number: 6 Primary Pressure Ulcer Etiology: Wound Location: Left Amputation Site - Below Knee - Midline Wound Open Status: Wounding Event: Pressure Injury Comorbid Anemia, Asthma, Chronic Obstructive Pulmonary Date Acquired: 06/09/2018 History: Disease (COPD), Sleep Apnea, Congestive Heart [name] Of Treatment: 0 Failure, Coronary Artery Disease, Hypertension, Clustered Wound: No Myocardial Infarction, Peripheral Venous Disease, End Stage Renal Disease, Neuropathy Photos Photo Uploaded By: [name] on 06/30/2018 14:49:50 Wound Measurements Length: (cm) 3.2 Width: (cm) 3.7 Depth: (cm) 0.1 Area: (cm) 9.299 Volume: (cm) 0.93 % Reduction in Area: 0% % Reduction in Volume: 0% Epithelialization: Small (1-33%) Tunneling: No Undermining: No Wound  Description Classification: Category/Stage III Foul O Wound Margin: Flat and Intact Slough Exudate Amount: Medium Exudate Type: Purulent Exudate Color: yellow, brown, green dor After Cleansing: No /Fibrino Yes Wound Bed Granulation Amount: Small (1-33%) Exposed Structure Granulation Quality: Pink, Pale Fascia Exposed: No Necrotic Amount: Medium (34-66%) Fat Layer (Subcutaneous Tissue) Exposed: Yes Necrotic Quality: Adherent Slough Tendon Exposed: No Muscle Exposed: No Joint Exposed: No Bone Exposed: No Pfeifer, Jaylen N. ([id number]) Periwound Skin Texture Texture Color No Abnormalities Noted: Yes No Abnormalities Noted: Yes Moisture No Abnormalities Noted: Yes Wound Preparation Ulcer Cleansing: Rinsed/Irrigated with Saline Topical Anesthetic Applied: Other: lidocaine 4%, Treatment Notes Wound #6 (Left, Midline Amputation Site - Below Knee) Notes prisma, bordered foam dressing Electronic Signature(s) Signed: 06/30/2018 5:09:39 PM By: [name], BSN, RN, CWS, [name] Entered By: [name] on 06/30/2018 10:51:37 Rivers, [name] ([id number]) -------------------------------------------------------------------------------- [place] Details Patient Name: Pagliuca, Morty N. Date of Service: 06/30/2018 10:15 AM Medical Record Number: [id number] Patient Account Number: [id number] Date of Birth/Sex: 10-04-1955 (63 y.o. M) Treating RN: [name] Primary Care Haiven Nardone: [name] Other Clinician: Referring Malia Corsi: [name] Treating Elayna Tobler/Extender: [name], [name] in Treatment: 0 Vital Signs Time Taken: 10:35 Temperature (F): 97.8 Height (in): 74 Pulse (bpm): 64 Weight (lbs): 210 Respiratory Rate (breaths/min): 16 Body Mass Index (BMI): 27 Blood Pressure (mmHg): 127/106 Reference Range: 80 - 120 mg / dl Airway Notes Patients states this is NORMAL FOR HIS bp. pa NOTIFIED. Electronic Signature(s) Signed: 06/30/2018 11:02:27 AM By: [name], BSN,  RN, CWS, [name] Entered By: [name] on 06/30/2018 11:02:27

## 2018-07-01 NOTE — Progress Notes (Signed)
Zunker, [name] [name] ([id number]) Visit Report for 06/30/2018 Chief Complaint Document Details Patient Name: [name] [name] ROMANOSKI. Date of Service: 06/30/2018 10:15 AM Medical Record Number: [id number] Patient Account Number: [id number] Date of Birth/Sex: 1955-10-11 (63 y.o. M) Treating RN: Montey Hora Primary Care Provider: Tomasa Hose Other Clinician: Referring Provider: Odessa Fleming Treating Provider/Extender: Melburn Hake, HOYT Weeks in Treatment: 0 Information Obtained from: Patient Chief Complaint Left stump pressure ulcer Electronic Signature(s) Signed: 06/30/2018 5:55:30 PM By: Worthy Keeler PA-C Entered By: Worthy Keeler on 06/30/2018 11:00:41 [place], [name] ([id number]) -------------------------------------------------------------------------------- HPI Details Patient Name: [name] [name] N. Date of Service: 06/30/2018 10:15 AM Medical Record Number: [id number] Patient Account Number: [id number] Date of Birth/Sex: 1955/11/26 (63 y.o. M) Treating RN: Montey Hora Primary Care Provider: Tomasa Hose Other Clinician: Referring Provider: Odessa Fleming Treating Provider/Extender: Melburn Hake, HOYT Weeks in Treatment: 0 History of Present Illness HPI Description: 63 year old male who was seen by others 2-1/2 years ago when he was being treated for vascular problems and dry gangrene of his right foot now returns with painful lesion on his penis for about a month. He was seen by the physicians assistant at the urology practice,who noted a easily retracted foreskin, patent urethral meatus and on the dorsal surface of the glands there was slough with healthy granulation tissue formation. 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 the patient is a smoker smoking about 10 cigarettes a day. past medical history of amputation of the right foot, left foot amputation, cardiomyopathy, CHF, hemodialysis  dependent, coronary artery disease, tobacco addiction. Seen by Dr. Leotis Pain on 08/29/2014 with ulceration on 2 toes on the right foot and dark discoloration worrisome for gangrenous changes on his 2nd and 3rd toes as well.o Given his previous history of below-knee amputation for peripheral disease and ulceration on the left, his multiple atherosclerotic risk factors and his poorly palpable pedal pulses, he was brought in for angiography for further evaluation and potential treatment. He had a percutaneous angioplasty of the right popliteal artery and the right anterior tibial artery and dorsalis pedis artery. He also had angioplasty of the right peroneal artery and the proximal and mid right posterior tibial artery. On 11/07/2014 he's had a duplex of the right lower extremity which showed the ABI was 0.9 and had a multiphasic waveform. Impression was of mild peripheral vascular disease predominantly small vessel disease right ankle. 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 11/22/2014 -- he has been off cigarettes completely since Saturday and is on a nicotine patch and have commended him about this. 12/08/2014 -- he was seen in the vascular office at [facility] and has been scheduled for a angiogram at the end of this month -- next Thursday. I have urged him to keep this appointment. 12/30/2014 -- since I saw him last on July 14 he had a procedure done by Dr. Lucky Cowboy on 12/15/2014. he had a aortogram and selective right lower extremity angiogram followed by a percutaneous angioplasty of the right popliteal artery, right anterior tibial artery, right peroneal artery, proximal and mid right posterior tibial artery. 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 Today he continues to  have pain and he says that some of his  toes are black. Readmission: 06/30/18 on evaluation today patient presents for reevaluation Concerning an altar on the invitation site below his knee. He states this is on the end where there is a ring on the bottom of his shrinker that seems to rather push against this area. Nonetheless this has been open for several weeks and really didn't seem to be getting better which is why he wanted to come to see [place]. Fortunately this does not appear to be too significant and he is having issues with minimal drainage which is good news. There's no signs of infection which is also good news. Overall I'm not too worried about the fact that we should be able to get this area to heal quite readily my biggest issue at this point is simply the fact that the patient is continuing to have what sounds like friction to the stump region which is gonna prevent this from being able to fully close and stay closed. He does sometimes wear his prosthesis although he has not for some time simply due to the fact that again he's had this wound. He does have a history of congestive heart failure, peripheral vascular disease, coronary artery disease, and in stage renal disease for which she is on dialysis. [name] [name] [name] ([id number]) Electronic Signature(s) Signed: 06/30/2018 5:55:30 PM By: Worthy Keeler PA-C Entered By: Worthy Keeler on 06/30/2018 14:54:29 [place], [name] ([id number]) -------------------------------------------------------------------------------- Physical Exam Details Patient Name: [name] [name] N. Date of Service: 06/30/2018 10:15 AM Medical Record Number: [id number] Patient Account Number: [id number] Date of Birth/Sex: 01/01/1956 (63 y.o. M) Treating RN: Montey Hora Primary Care Provider: Tomasa Hose Other Clinician: Referring Provider: Odessa Fleming Treating Provider/Extender: STONE III, HOYT Weeks in Treatment: 0 Constitutional sitting or standing  blood pressure is within target range for patient.. pulse regular and within target range for patient.[name] respirations regular, non-labored and within target range for patient.[name] temperature within target range for patient.. Well- nourished and well-hydrated in no acute distress. Eyes conjunctiva clear no eyelid edema noted. pupils equal round and reactive to light and accommodation. Ears, Nose, Mouth, and Throat no gross abnormality of ear auricles or external auditory canals. normal hearing noted during conversation. mucus membranes moist. Respiratory normal breathing without difficulty. clear to auscultation bilaterally. Cardiovascular regular rate and rhythm with normal S1, S2. no clubbing, cyanosis, significant edema, <3 sec cap refill right LE. Gastrointestinal (GI) soft, non-tender, non-distended, +BS. no ventral hernia noted. Musculoskeletal Patient unable to walk without assistance. Left BKA. Psychiatric this patient is able to make decisions and demonstrates good insight into disease process. Alert and Oriented x 3. pleasant and cooperative. Notes On inspection today patient's wound actually did have some dead skin on the surface which was gently debrided away with saline and gauze and the patient tolerated this today without complication the numbing medicine he states really seemed to help quite significantly. Post mechanical debridement the wound bed appears to be doing much better which is excellent news. Overall I'm very pleased in this regard. Electronic Signature(s) Signed: 06/30/2018 5:55:30 PM By: Worthy Keeler PA-C Entered By: Worthy Keeler on 06/30/2018 14:55:58 [place], [name] ([id number]) -------------------------------------------------------------------------------- Physician Orders Details Patient Name: [name] [name] N. Date of Service: 06/30/2018 10:15 AM Medical Record Number: [id number] Patient Account Number: [id number] Date of Birth/Sex: 1955/09/20 (63 y.o.  M) Treating RN: Montey Hora Primary Care Provider: Tomasa Hose Other Clinician: Referring Provider: Odessa Fleming Treating Provider/Extender: STONE III, HOYT Weeks in Treatment: 0 Verbal / Phone Orders: No Diagnosis  Coding ICD-10 Coding Code Description L98.492 Non-pressure chronic ulcer of skin of other sites with fat layer exposed Z89.512 Acquired absence of left leg below knee I50.42 Chronic combined systolic (congestive) and diastolic (congestive) heart failure I73.89 Other specified peripheral vascular diseases I25.119 Atherosclerotic heart disease of native coronary artery with unspecified angina pectoris Wound Cleansing Wound #6 Left,Midline Amputation Site - Below Knee o Clean wound with Normal Saline. o May Shower, gently pat wound dry prior to applying new dressing. Anesthetic (add to Medication List) Wound #6 Left,Midline Amputation Site - Below Knee o Topical Lidocaine 4% cream applied to wound bed prior to debridement (In Clinic Only). Primary Wound Dressing Wound #6 Left,Midline Amputation Site - Below Knee o Silver Collagen Secondary Dressing Wound #6 Left,Midline Amputation Site - Below Knee o Boardered Foam Dressing Dressing Change Frequency Wound #6 Left,Midline Amputation Site - Below Knee o Change dressing every other day. Follow-up Appointments Wound #6 Left,Midline Amputation Site - Below Knee o Return Appointment in 1 week. Off-Loading Wound #6 Left,Midline Amputation Site - Below Knee o Other: - continue not wearing your prosthesis Home Health Wound #6 Left,Midline Amputation Site - Below Knee o [place] Visits ALAIN, DESCHENE ([id number]) o [place] Nurse may visit PRN to address patientos wound care needs. o FACE TO FACE ENCOUNTER: MEDICARE and MEDICAID PATIENTS: I certify that this patient is under my care and that I had a face-to-face encounter that meets the physician face-to-face encounter  requirements with this patient on this date. The encounter with the patient was in whole or in part for the following MEDICAL CONDITION: (primary reason for [place]) MEDICAL NECESSITY: I certify, that based on my findings, NURSING services are a medically necessary home health service. HOME BOUND STATUS: I certify that my clinical findings support that this patient is homebound (i.e., Due to illness or injury, pt requires aid of supportive devices such as crutches, cane, wheelchairs, walkers, the use of special transportation or the assistance of another person to leave their place of residence. There is a normal inability to leave the home and doing so requires considerable and taxing effort. Other absences are for medical reasons / religious services and are infrequent or of short duration when for other reasons). o If current dressing causes regression in wound condition, may D/C ordered dressing product/s and apply Normal Saline Moist Dressing daily until next [place] / Other MD appointment. [place] of regression in wound condition at [phone]. o Please direct any NON-WOUND related issues/requests for orders to patient's Primary Care Physician Electronic Signature(s) Signed: 06/30/2018 4:52:45 PM By: Montey Hora Signed: 06/30/2018 5:55:30 PM By: Worthy Keeler PA-C Entered By: Montey Hora on 06/30/2018 11:23:15 [place], [name] ([id number]) -------------------------------------------------------------------------------- Problem List Details Patient Name: [name] [name] N. Date of Service: 06/30/2018 10:15 AM Medical Record Number: [id number] Patient Account Number: [id number] Date of Birth/Sex: [birth year] (63 y.o. M) Treating RN: Montey Hora Primary Care Provider: Tomasa Hose Other Clinician: Referring Provider: Odessa Fleming Treating Provider/Extender: Melburn Hake, HOYT Weeks in Treatment: 0 Active Problems ICD-10 Evaluated  Encounter Code Description Active Date Today Diagnosis L98.492 Non-pressure chronic ulcer of skin of other sites with fat layer 06/30/2018 No Yes exposed Z89.512 Acquired absence of left leg below knee 06/30/2018 No Yes I50.42 Chronic combined systolic (congestive) and diastolic 0/06/6376 No Yes (congestive) heart failure I73.89 Other specified peripheral vascular diseases 06/30/2018 No Yes I25.119 Atherosclerotic heart disease of native coronary artery with 06/30/2018 No Yes unspecified angina pectoris Inactive  Problems Resolved Problems Electronic Signature(s) Signed: 06/30/2018 5:55:30 PM By: Worthy Keeler PA-C Entered By: Worthy Keeler on 06/30/2018 11:00:10 [place], [name] ([id number]) -------------------------------------------------------------------------------- Progress Note Details Patient Name: [name] [name] N. Date of Service: 06/30/2018 10:15 AM Medical Record Number: [id number] Patient Account Number: [id number] Date of Birth/Sex: [birth year] (63 y.o. M) Treating RN: Montey Hora Primary Care Provider: Tomasa Hose Other Clinician: Referring Provider: Odessa Fleming Treating Provider/Extender: Melburn Hake, HOYT Weeks in Treatment: 0 Subjective Chief Complaint Information obtained from Patient Left stump pressure ulcer History of Present Illness (HPI) 63 year old male who was seen by others 2-1/2 years ago when he was being treated for vascular problems and dry gangrene of his right foot now returns with painful lesion on his penis for about a month. He was seen by the physicians assistant at the urology practice,who noted a easily retracted foreskin, patent urethral meatus and on the dorsal surface of the glands there was slough with healthy granulation tissue formation. 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 the patient is a smoker smoking about 10 cigarettes a day. past medical history of  amputation of the right foot, left foot amputation, cardiomyopathy, CHF, hemodialysis dependent, coronary artery disease, tobacco addiction. Seen by Dr. Leotis Pain on 08/29/2014 with ulceration on 2 toes on the right foot and dark discoloration worrisome for gangrenous changes on his 2nd and 3rd toes as well. Given his previous history of below-knee amputation for peripheral disease and ulceration on the left, his multiple atherosclerotic risk factors and his poorly palpable pedal pulses, he was brought in for angiography for further evaluation and potential treatment. He had a percutaneous angioplasty of the right popliteal artery and the right anterior tibial artery and dorsalis pedis artery. He also had angioplasty of the right peroneal artery and the proximal and mid right posterior tibial artery. On 11/07/2014 he's had a duplex of the right lower extremity which showed the ABI was 0.9 and had a multiphasic waveform. Impression was of mild peripheral vascular disease predominantly small vessel disease right ankle. 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 11/22/2014 -- he has been off cigarettes completely since Saturday and is on a nicotine patch and have commended him about this. 12/08/2014 -- he was seen in the vascular office at [facility] and has been scheduled for a angiogram at the end of this month -- next Thursday. I have urged him to keep this appointment. 12/30/2014 -- since I saw him last on July 14 he had a procedure done by Dr. Lucky Cowboy on 12/15/2014. he had a aortogram and selective right lower extremity angiogram followed by a percutaneous angioplasty of the right popliteal artery, right anterior tibial artery, right peroneal artery, proximal and mid right posterior tibial artery. the patient was then seen with progressive gangrene of the right foot which was a dry gangrene and the patient was asked to  follow-up with the vascular surgeons but did not do so last week. Today he continues to have pain and he says that some of his toes are black. Readmission: 06/30/18 on evaluation today patient presents for reevaluation Concerning an altar on the invitation site below his knee. He states this is on the end where there is a ring on the bottom of his shrinker that seems to rather push against this area. Nonetheless this has been open for several weeks and really  didn't seem to be getting better which is why he wanted to come to see [place]. Fortunately this does not appear to be too significant and he is having issues with minimal drainage which is good news. There's no signs of infection which is also good news. Overall I'm not too worried about the fact that we should be able [name] [name] MONDAY ([id number]) to get this area to heal quite readily my biggest issue at this point is simply the fact that the patient is continuing to have what sounds like friction to the stump region which is gonna prevent this from being able to fully close and stay closed. He does sometimes wear his prosthesis although he has not for some time simply due to the fact that again he's had this wound. He does have a history of congestive heart failure, peripheral vascular disease, coronary artery disease, and in stage renal disease for which she is on dialysis. Wound History Patient presents with 1 open wound that has been present for approximately 3 weeks. Laboratory tests have been performed in the last month. Patient reportedly has tested positive for an antibiotic resistant organism. Patient reportedly has not tested positive for osteomyelitis. Patient reportedly has had testing performed to evaluate circulation in the legs. Patient History Information obtained from Patient. Allergies No Known Allergies Family History Cancer - Paternal Grandparents, Diabetes - Father, Heart Disease - Father, Hypertension - Father, No  family history of Hereditary Spherocytosis, Kidney Disease, Lung Disease, Seizures, Stroke, Thyroid Problems, Tuberculosis. Social History Current every day smoker, Marital Status - Widowed, Alcohol Use - Never, Drug Use - Prior History, Caffeine Use - Never. Medical History Eyes Denies history of Cataracts, Glaucoma, Optic Neuritis Ear/Nose/Mouth/Throat Denies history of Chronic sinus problems/congestion, Middle ear problems Hematologic/Lymphatic Patient has history of Anemia Denies history of Hemophilia, Human Immunodeficiency Virus, Lymphedema, Sickle Cell Disease Respiratory Patient has history of Asthma, Chronic Obstructive Pulmonary Disease (COPD), Sleep Apnea Denies history of Aspiration, Pneumothorax, Tuberculosis Cardiovascular Patient has history of Congestive Heart Failure, Coronary Artery Disease, Hypertension, Myocardial Infarction, Peripheral Venous Disease Denies history of Angina, Arrhythmia, Deep Vein Thrombosis, Hypotension, Peripheral Arterial Disease, Phlebitis, Vasculitis Gastrointestinal Denies history of Cirrhosis , Colitis, Crohn s, Hepatitis A, Hepatitis B, Hepatitis C Endocrine Denies history of Type I Diabetes, Type II Diabetes Genitourinary Patient has history of End Stage Renal Disease - HD MWF Immunological Denies history of Lupus Erythematosus, Raynaud s, Scleroderma Integumentary (Skin) Denies history of History of Burn, History of pressure wounds Musculoskeletal Denies history of Gout, Rheumatoid Arthritis, Osteoarthritis, Osteomyelitis Neurologic Patient has history of Neuropathy Denies history of Dementia, Quadriplegia, Paraplegia, Seizure Disorder Oncologic Denies history of Received Chemotherapy, Received Radiation Psychiatric Baley, CLEM WISENBAKER ([id number]) Denies history of Anorexia/bulimia, Confinement Anxiety Hospitalization/Surgery History - 06/27/2014, [place], L BKA. Medical And Surgical History Notes Musculoskeletal L BKA Review of  Systems (ROS) Eyes The patient has no complaints or symptoms. Ear/Nose/Mouth/Throat The patient has no complaints or symptoms. Hematologic/Lymphatic Complains or has symptoms of Bleeding / Clotting Disorders. Respiratory The patient has no complaints or symptoms. Cardiovascular The patient has no complaints or symptoms. Gastrointestinal The patient has no complaints or symptoms. Endocrine The patient has no complaints or symptoms. Genitourinary Complains or has symptoms of Kidney failure/ Dialysis. Denies complaints or symptoms of Incontinence/dribbling. Immunological The patient has no complaints or symptoms. Integumentary (Skin) Complains or has symptoms of Wounds, Bleeding or bruising tendency. Denies complaints or symptoms of Breakdown, Swelling. Musculoskeletal The patient has no complaints or symptoms. Neurologic Complains or has  symptoms of Focal/Weakness - legs are week. Oncologic The patient has no complaints or symptoms. Psychiatric The patient has no complaints or symptoms. Objective Constitutional sitting or standing blood pressure is within target range for patient.. pulse regular and within target range for patient.[name] respirations regular, non-labored and within target range for patient.[name] temperature within target range for patient.. Well- nourished and well-hydrated in no acute distress. Vitals Time Taken: 10:35 AM, Height: 74 in, Weight: 210 lbs, BMI: 27, Temperature: 97.8 [name], Pulse: 64 bpm, Respiratory Rate: 16 breaths/min, Blood Pressure: 127/106 mmHg. General Notes: Patients states this is NORMAL FOR HIS bp. pa NOTIFIED. [name] [name] [place]. ([id number]) Eyes conjunctiva clear no eyelid edema noted. pupils equal round and reactive to light and accommodation. Ears, Nose, Mouth, and Throat no gross abnormality of ear auricles or external auditory canals. normal hearing noted during conversation. mucus membranes moist. Respiratory normal breathing without  difficulty. clear to auscultation bilaterally. Cardiovascular regular rate and rhythm with normal S1, S2. no clubbing, cyanosis, significant edema, Gastrointestinal (GI) soft, non-tender, non-distended, +BS. no ventral hernia noted. Musculoskeletal Patient unable to walk without assistance. Left BKA. Psychiatric this patient is able to make decisions and demonstrates good insight into disease process. Alert and Oriented x 3. pleasant and cooperative. General Notes: On inspection today patient's wound actually did have some dead skin on the surface which was gently debrided away with saline and gauze and the patient tolerated this today without complication the numbing medicine he states really seemed to help quite significantly. Post mechanical debridement the wound bed appears to be doing much better which is excellent news. Overall I'm very pleased in this regard. Integumentary (Hair, Skin) Wound #6 status is Open. Original cause of wound was Pressure Injury. The wound is located on the Left,Midline Amputation Site - Below Knee. The wound measures 3.2cm length x 3.7cm width x 0.1cm depth; 9.299cm^2 area and 0.93cm^3 volume. There is Fat Layer (Subcutaneous Tissue) Exposed exposed. There is no tunneling or undermining noted. There is a medium amount of purulent drainage noted. The wound margin is flat and intact. There is small (1-33%) pink, pale granulation within the wound bed. There is a medium (34-66%) amount of necrotic tissue within the wound bed including Adherent Slough. The periwound skin appearance had no abnormalities noted for texture. The periwound skin appearance had no abnormalities noted for moisture. The periwound skin appearance had no abnormalities noted for color. Assessment Active Problems ICD-10 Non-pressure chronic ulcer of skin of other sites with fat layer exposed Acquired absence of left leg below knee Chronic combined systolic (congestive) and diastolic  (congestive) heart failure Other specified peripheral vascular diseases Atherosclerotic heart disease of native coronary artery with unspecified angina pectoris Plan [name] [name] N. ([id number]) Wound Cleansing: Wound #6 Left,Midline Amputation Site - Below Knee: Clean wound with Normal Saline. May Shower, gently pat wound dry prior to applying new dressing. Anesthetic (add to Medication List): Wound #6 Left,Midline Amputation Site - Below Knee: Topical Lidocaine 4% cream applied to wound bed prior to debridement (In Clinic Only). Primary Wound Dressing: Wound #6 Left,Midline Amputation Site - Below Knee: Silver Collagen Secondary Dressing: Wound #6 Left,Midline Amputation Site - Below Knee: Boardered Foam Dressing Dressing Change Frequency: Wound #6 Left,Midline Amputation Site - Below Knee: Change dressing every other day. Follow-up Appointments: Wound #6 Left,Midline Amputation Site - Below Knee: Return Appointment in 1 week. Off-Loading: Wound #6 Left,Midline Amputation Site - Below Knee: Other: - continue not wearing your prosthesis Home Health: Wound #6 Left,Midline Amputation Site -  Below Knee: [place] Nurse may visit PRN to address patient s wound care needs. FACE TO FACE ENCOUNTER: MEDICARE and MEDICAID PATIENTS: I certify that this patient is under my care and that I had a face-to-face encounter that meets the physician face-to-face encounter requirements with this patient on this date. The encounter with the patient was in whole or in part for the following MEDICAL CONDITION: (primary reason for [place]) MEDICAL NECESSITY: I certify, that based on my findings, NURSING services are a medically necessary home health service. HOME BOUND STATUS: I certify that my clinical findings support that this patient is homebound (i.e., Due to illness or injury, pt requires aid of supportive devices such as crutches, cane, wheelchairs, walkers,  the use of special transportation or the assistance of another person to leave their place of residence. There is a normal inability to leave the home and doing so requires considerable and taxing effort. Other absences are for medical reasons / religious services and are infrequent or of short duration when for other reasons). If current dressing causes regression in wound condition, may D/C ordered dressing product/s and apply Normal Saline Moist Dressing daily until next [place] / Other MD appointment. [place] of regression in wound condition at [phone]. Please direct any NON-WOUND related issues/requests for orders to patient's Primary Care Physician Patient's wound bed currently again appears to be doing rather well I think that we can use a collagen dressing to try to help with getting this area to heal appropriately. The patient is in agreement with plan. We will subsequently see were things stand at follow-up. If anything changes or worsens in the meantime he will contact the office and let me know. Otherwise will see him back in one week. Please see above for specific wound care orders. We will see patient for re-evaluation in 1 week(s) here in the clinic. If anything worsens or changes patient will contact our office for additional recommendations. Electronic Signature(s) Signed: 06/30/2018 5:55:30 PM By: Worthy Keeler PA-C Entered By: Worthy Keeler on 06/30/2018 14:56:30 Winiecki, [name] ([id number]) -------------------------------------------------------------------------------- ROS/PFSH Details Patient Name: [name] [name] N. Date of Service: 06/30/2018 10:15 AM Medical Record Number: [id number] Patient Account Number: [id number] Date of Birth/Sex: Jun 24, 1955 (63 y.o. M) Treating RN: Cornell Barman Primary Care Provider: Tomasa Hose Other Clinician: Referring Provider: Odessa Fleming Treating Provider/Extender: Melburn Hake, HOYT Weeks in  Treatment: 0 Information Obtained From Patient Wound History Do you currently have one or more open woundso Yes How many open wounds do you currently haveo 1 Approximately how long have you had your woundso 3 weeks Has your wound(s) ever healed and then re-openedo No Have you had any lab work done in the past montho Yes Have you tested positive for an antibiotic resistant organism (MRSA, VRE)o Yes Have you tested positive for osteomyelitis (bone infection)o No Have you had any tests for circulation on your legso Yes Hematologic/Lymphatic Complaints and Symptoms: Positive for: Bleeding / Clotting Disorders Medical History: Positive for: Anemia Negative for: Hemophilia; Human Immunodeficiency Virus; Lymphedema; Sickle Cell Disease Genitourinary Complaints and Symptoms: Positive for: Kidney failure/ Dialysis Negative for: Incontinence/dribbling Medical History: Positive for: End Stage Renal Disease - HD MWF Integumentary (Skin) Complaints and Symptoms: Positive for: Wounds; Bleeding or bruising tendency Negative for: Breakdown; Swelling Medical History: Negative for: History of Burn; History of pressure wounds Neurologic Complaints and Symptoms: Positive for: Focal/Weakness - legs are week Medical History: Positive for: Neuropathy Negative for:  Dementia; Quadriplegia; Paraplegia; Seizure Disorder [place], [name] FERNANDO. ([id number]) Eyes Complaints and Symptoms: No Complaints or Symptoms Medical History: Negative for: Cataracts; Glaucoma; Optic Neuritis Ear/Nose/Mouth/Throat Complaints and Symptoms: No Complaints or Symptoms Medical History: Negative for: Chronic sinus problems/congestion; Middle ear problems Respiratory Complaints and Symptoms: No Complaints or Symptoms Medical History: Positive for: Asthma; Chronic Obstructive Pulmonary Disease (COPD); Sleep Apnea Negative for: Aspiration; Pneumothorax; Tuberculosis Cardiovascular Complaints and Symptoms: No Complaints  or Symptoms Medical History: Positive for: Congestive Heart Failure; Coronary Artery Disease; Hypertension; Myocardial Infarction; Peripheral Venous Disease Negative for: Angina; Arrhythmia; Deep Vein Thrombosis; Hypotension; Peripheral Arterial Disease; Phlebitis; Vasculitis Gastrointestinal Complaints and Symptoms: No Complaints or Symptoms Medical History: Negative for: Cirrhosis ; Colitis; Crohnos; Hepatitis A; Hepatitis B; Hepatitis C Endocrine Complaints and Symptoms: No Complaints or Symptoms Medical History: Negative for: Type I Diabetes; Type II Diabetes Immunological Complaints and Symptoms: No Complaints or Symptoms Medical History: Negative for: Lupus Erythematosus; Raynaudos; Scleroderma Musculoskeletal [place], EDWORD CU. ([id number]) Complaints and Symptoms: No Complaints or Symptoms Medical History: Negative for: Gout; Rheumatoid Arthritis; Osteoarthritis; Osteomyelitis Past Medical History Notes: L BKA Oncologic Complaints and Symptoms: No Complaints or Symptoms Medical History: Negative for: Received Chemotherapy; Received Radiation Psychiatric Complaints and Symptoms: No Complaints or Symptoms Medical History: Negative for: Anorexia/bulimia; Confinement Anxiety Immunizations Pneumococcal Vaccine: Received Pneumococcal Vaccination: Yes Implantable Devices Hospitalization / Surgery History Name of Hospital Purpose of Hospitalization/[place] Date [facility] 06/27/2014 Family and Social History Cancer: Yes - Paternal Grandparents; Diabetes: Yes - Father; Heart Disease: Yes - Father; Hereditary Spherocytosis: No; Hypertension: Yes - Father; Kidney Disease: No; Lung Disease: No; Seizures: No; Stroke: No; Thyroid Problems: No; Tuberculosis: No; Current every day smoker; Marital Status - Widowed; Alcohol Use: Never; Drug Use: Prior History; Caffeine Use: Never; Financial Concerns: No; Food, Clothing or Shelter Needs: No; Support System Lacking:  No; Transportation Concerns: No; Advanced Directives: No; Patient does not want information on Administrator) Signed: 06/30/2018 5:09:39 PM By: Gretta Cool, BSN, RN, CWS, Kim RN, BSN Signed: 06/30/2018 5:55:30 PM By: Worthy Keeler PA-C Entered By: Gretta Cool, BSN, RN, CWS, Kim on 06/30/2018 10:41:50 [place], [name] ([id number]) -------------------------------------------------------------------------------- [place] Details Patient Name: [place], [name] Date of Service: 06/30/2018 Medical Record Number: [id number] Patient Account Number: [id number] Date of Birth/Sex: 1955/10/16 (63 y.o. 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) heart failure I73.89 Other specified peripheral vascular diseases I25.119 Atherosclerotic heart disease of native coronary artery with unspecified angina pectoris Facility Procedures CPT4 Code: [id number] Description: 99214 - WOUND CARE VISIT-LEV 4 EST PT Modifier: Quantity: 1 Physician Procedures CPT4 Code Description: [id number] - WC PHYS LEVEL 4 - EST PT ICD-10 Diagnosis Description L98.492 Non-pressure chronic ulcer of skin of other sites with fat la Z89.512 Acquired absence of left leg below knee I50.42 Chronic combined systolic  (congestive) and diastolic (congest P71.06 Other specified peripheral vascular diseases Modifier: yer exposed ive) heart failur Quantity: 1 e Electronic Signature(s) Signed: 06/30/2018 5:55:30 PM By: Worthy Keeler PA-C Entered By: Worthy Keeler on 06/30/2018 14:56:45

## 2018-07-10 ENCOUNTER — Encounter: Payer: Medicare Other | Admitting: Physician Assistant

## 2018-07-10 DIAGNOSIS — T8789 Other complications of amputation stump: Secondary | ICD-10-CM | POA: Diagnosis not present

## 2018-07-13 NOTE — Progress Notes (Signed)
Boyan, RENTON BERKLEY ([id number]) Visit Report for 07/10/2018 Chief Complaint Document Details Patient Name: Mcgirr, [name]. Date of Service: 07/10/2018 12:30 PM Medical Record Number: [id number] Patient Account Number: [id number] Date of Birth/Sex: November 07, 1955 (63 y.o. M) Treating RN: Harold Barban Primary Care Provider: Tomasa Hose Other Clinician: Referring Provider: Tomasa Hose Treating Provider/Extender: Melburn Hake, HOYT Weeks in Treatment: 1 Information Obtained from: Patient Chief Complaint Left stump pressure ulcer Electronic Signature(s) Signed: 07/12/2018 7:15:48 AM By: Worthy Keeler PA-C Entered By: Worthy Keeler on 07/10/2018 12:59:51 Zirkle, [name] ([id number]) -------------------------------------------------------------------------------- HPI Details Patient Name: Mcelhinney, Eathan N. Date of Service: 07/10/2018 12:30 PM Medical Record Number: [id number] Patient Account Number: [id number] Date of Birth/Sex: [birth year] (63 y.o. M) Treating RN: Harold Barban Primary Care Provider: Tomasa Hose Other Clinician: Referring Provider: Tomasa Hose Treating Provider/Extender: Melburn Hake, HOYT Weeks in Treatment: 1 History of Present Illness HPI Description: 63 year old male who was seen by others 2-1/2 years ago when he was being treated for vascular problems and dry gangrene of his right foot now returns with painful lesion on his penis for about a month. He was seen by the physicians assistant at the urology practice,who noted a easily retracted foreskin, patent urethral meatus and on the dorsal surface of the glands there was slough with healthy granulation tissue formation. 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 the patient is a smoker smoking about 10 cigarettes a day. past medical history of amputation of the right foot, left foot amputation, cardiomyopathy, CHF, hemodialysis  dependent, coronary artery disease, tobacco addiction. Seen by Dr. Leotis Pain on 08/29/2014 with ulceration on 2 toes on the right foot and dark discoloration worrisome for gangrenous changes on his 2nd and 3rd toes as well.o Given his previous history of below-knee amputation for peripheral disease and ulceration on the left, his multiple atherosclerotic risk factors and his poorly palpable pedal pulses, he was brought in for angiography for further evaluation and potential treatment. He had a percutaneous angioplasty of the right popliteal artery and the right anterior tibial artery and dorsalis pedis artery. He also had angioplasty of the right peroneal artery and the proximal and mid right posterior tibial artery. On 11/07/2014 he's had a duplex of the right lower extremity which showed the ABI was 0.9 and had a multiphasic waveform. Impression was of mild peripheral vascular disease predominantly small vessel disease right ankle. 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 11/22/2014 -- he has been off cigarettes completely since Saturday and is on a nicotine patch and have commended him about this. 12/08/2014 -- he was seen in the vascular office at [place] and has been scheduled for a angiogram at the end of this month -- next Thursday. I have urged him to keep this appointment. 12/30/2014 -- since I saw him last on July 14 he had a procedure done by Dr. Lucky Cowboy on 12/15/2014. he had a aortogram and selective right lower extremity angiogram followed by a percutaneous angioplasty of the right popliteal artery, right anterior tibial artery, right peroneal artery, proximal and mid right posterior tibial artery. 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 Today he continues to  have pain and he says that some of his  toes are black. Readmission: 06/30/18 on evaluation today patient presents for reevaluation Concerning an altar on the invitation site below his knee. He states this is on the end where there is a ring on the bottom of his shrinker that seems to rather push against this area. Nonetheless this has been open for several weeks and really didn't seem to be getting better which is why he wanted to come to see [place]. Fortunately this does not appear to be too significant and he is having issues with minimal drainage which is good news. There's no signs of infection which is also good news. Overall I'm not too worried about the fact that we should be able to get this area to heal quite readily my biggest issue at this point is simply the fact that the patient is continuing to have what sounds like friction to the stump region which is gonna prevent this from being able to fully close and stay closed. He does sometimes wear his prosthesis although he has not for some time simply due to the fact that again he's had this wound. He does have a history of congestive heart failure, peripheral vascular disease, coronary artery disease, and in stage renal disease for which she is on dialysis. Cardozo, KINSEY COWSERT ([id number]) 07/10/18 on evaluation today patient actually appears to be doing very well in regard to his ulcer on the distal aspect of the left amputation site this actually appears to be doing very well. Fortunately he's been tolerating the dressing changes without complication. There does not appear to be any signs of infection at this time. Electronic Signature(s) Signed: 07/12/2018 7:15:48 AM By: Worthy Keeler PA-C Entered By: Worthy Keeler on 07/10/2018 13:27:05 Perlmutter, [name] ([id number]) -------------------------------------------------------------------------------- Physical Exam Details Patient Name: Forrester, Kiven N. Date of Service: 07/10/2018 12:30  PM Medical Record Number: [id number] Patient Account Number: [id number] Date of Birth/Sex: 18-Dec-1955 (63 y.o. M) Treating RN: Harold Barban Primary Care Provider: Tomasa Hose Other Clinician: Referring Provider: Tomasa Hose Treating Provider/Extender: STONE III, HOYT Weeks in Treatment: 1 Constitutional Well-nourished and well-hydrated in no acute distress. Respiratory normal breathing without difficulty. clear to auscultation bilaterally. Cardiovascular regular rate and rhythm with normal S1, S2. Psychiatric this patient is able to make decisions and demonstrates good insight into disease process. Alert and Oriented x 3. pleasant and cooperative. Notes Upon inspection of this time patient's wound bed actually shows signs of excellent epithelialization although a lot of the new skin is very thin I think this needs to be protected this point. Some of the collagen that was utilized was actually stuck to the tissue surrounding the wound bed this has me somewhat concerned about the possibility of this hearing any new skin this growing. Obviously this is definitely not something we want to happen. Electronic Signature(s) Signed: 07/12/2018 7:15:48 AM By: Worthy Keeler PA-C Entered By: Worthy Keeler on 07/10/2018 13:27:47 Lobello, [name] ([id number]) -------------------------------------------------------------------------------- Physician Orders Details Patient Name: Dekker, Jhace N. Date of Service: 07/10/2018 12:30 PM Medical Record Number: [id number] Patient Account Number: [id number] Date of Birth/Sex: 06-26-1955 (63 y.o. M) Treating RN: Harold Barban Primary Care Provider: Tomasa Hose Other Clinician: Referring Provider: Tomasa Hose Treating Provider/Extender: Melburn Hake, HOYT Weeks in Treatment: 1 Verbal / Phone Orders: No Diagnosis Coding ICD-10 Coding Code Description L98.492 Non-pressure chronic ulcer of skin of other sites with fat layer exposed Z89.512 Acquired  absence of left leg below knee I50.42 Chronic combined systolic (congestive) and diastolic (congestive) heart failure I73.89  Other specified peripheral vascular diseases I25.119 Atherosclerotic heart disease of native coronary artery with unspecified angina pectoris Wound Cleansing Wound #6 Left,Midline Amputation Site - Below Knee o Clean wound with Normal Saline. o May Shower, gently pat wound dry prior to applying new dressing. Anesthetic (add to Medication List) Wound #6 Left,Midline Amputation Site - Below Knee o Topical Lidocaine 4% cream applied to wound bed prior to debridement (In Clinic Only). Primary Wound Dressing Wound #6 Left,Midline Amputation Site - Below Knee o Xeroform Secondary Dressing Wound #6 Left,Midline Amputation Site - Below Knee o Boardered Foam Dressing Dressing Change Frequency Wound #6 Left,Midline Amputation Site - Below Knee o Change Dressing Monday, Wednesday, Friday - We'll see patient on Friday Follow-up Appointments Wound #6 Left,Midline Amputation Site - Below Knee o Return Appointment in 1 week. Off-Loading Wound #6 Left,Midline Amputation Site - Below Knee o Other: - continue not wearing your prosthesis Home Health Wound #6 Left,Midline Amputation Site - Below Knee o [place] Visits ANTWOINE, ZORN ([id number]) o [place] Nurse may visit PRN to address patientos wound care needs. o FACE TO FACE ENCOUNTER: MEDICARE and MEDICAID PATIENTS: I certify that this patient is under my care and that I had a face-to-face encounter that meets the physician face-to-face encounter requirements with this patient on this date. The encounter with the patient was in whole or in part for the following MEDICAL CONDITION: (primary reason for [place]) MEDICAL NECESSITY: I certify, that based on my findings, NURSING services are a medically necessary home health service. HOME BOUND STATUS: I certify that my clinical  findings support that this patient is homebound (i.e., Due to illness or injury, pt requires aid of supportive devices such as crutches, cane, wheelchairs, walkers, the use of special transportation or the assistance of another person to leave their place of residence. There is a normal inability to leave the home and doing so requires considerable and taxing effort. Other absences are for medical reasons / religious services and are infrequent or of short duration when for other reasons). o If current dressing causes regression in wound condition, may D/C ordered dressing product/s and apply Normal Saline Moist Dressing daily until next [place] / Other MD appointment. [place] of regression in wound condition at [phone]. o Please direct any NON-WOUND related issues/requests for orders to patient's Primary Care Physician Electronic Signature(s) Signed: 07/12/2018 7:15:48 AM By: Worthy Keeler PA-C Signed: 07/13/2018 9:36:27 AM By: Harold Barban Entered By: Harold Barban on 07/10/2018 13:09:59 Botero, [name] ([id number]) -------------------------------------------------------------------------------- Problem List Details Patient Name: Korson, Nyquan N. Date of Service: 07/10/2018 12:30 PM Medical Record Number: [id number] Patient Account Number: [id number] Date of Birth/Sex: Oct 24, 1955 (63 y.o. M) Treating RN: Harold Barban Primary Care Provider: Tomasa Hose Other Clinician: Referring Provider: Tomasa Hose Treating Provider/Extender: Melburn Hake, HOYT Weeks in Treatment: 1 Active Problems ICD-10 Evaluated Encounter Code Description Active Date Today Diagnosis L98.492 Non-pressure chronic ulcer of skin of other sites with fat layer 06/30/2018 No Yes exposed Z89.512 Acquired absence of left leg below knee 06/30/2018 No Yes I50.42 Chronic combined systolic (congestive) and diastolic 07/05/9240 No Yes (congestive) heart failure I73.89 Other  specified peripheral vascular diseases 06/30/2018 No Yes I25.119 Atherosclerotic heart disease of native coronary artery with 06/30/2018 No Yes unspecified angina pectoris Inactive Problems Resolved Problems Electronic Signature(s) Signed: 07/12/2018 7:15:48 AM By: Worthy Keeler PA-C Entered By: Worthy Keeler on 07/10/2018 12:59:44 Yaw, [name] ([id number]) -------------------------------------------------------------------------------- Progress Note Details Patient Name:  Brannick, Thaer N. Date of Service: 07/10/2018 12:30 PM Medical Record Number: [id number] Patient Account Number: [id number] Date of Birth/Sex: [birth year] (63 y.o. M) Treating RN: Harold Barban Primary Care Provider: Tomasa Hose Other Clinician: Referring Provider: Tomasa Hose Treating Provider/Extender: Melburn Hake, HOYT Weeks in Treatment: 1 Subjective Chief Complaint Information obtained from Patient Left stump pressure ulcer History of Present Illness (HPI) 64 year old male who was seen by others 2-1/2 years ago when he was being treated for vascular problems and dry gangrene of his right foot now returns with painful lesion on his penis for about a month. He was seen by the physicians assistant at the urology practice,who noted a easily retracted foreskin, patent urethral meatus and on the dorsal surface of the glands there was slough with healthy granulation tissue formation. 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 the patient is a smoker smoking about 10 cigarettes a day. past medical history of amputation of the right foot, left foot amputation, cardiomyopathy, CHF, hemodialysis dependent, coronary artery disease, tobacco addiction. Seen by Dr. Leotis Pain on 08/29/2014 with ulceration on 2 toes on the right foot and dark discoloration worrisome for gangrenous changes on his 2nd and 3rd toes as well. Given his previous history  of below-knee amputation for peripheral disease and ulceration on the left, his multiple atherosclerotic risk factors and his poorly palpable pedal pulses, he was brought in for angiography for further evaluation and potential treatment. He had a percutaneous angioplasty of the right popliteal artery and the right anterior tibial artery and dorsalis pedis artery. He also had angioplasty of the right peroneal artery and the proximal and mid right posterior tibial artery. On 11/07/2014 he's had a duplex of the right lower extremity which showed the ABI was 0.9 and had a multiphasic waveform. Impression was of mild peripheral vascular disease predominantly small vessel disease right ankle. 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 11/22/2014 -- he has been off cigarettes completely since Saturday and is on a nicotine patch and have commended him about this. 12/08/2014 -- he was seen in the vascular office at [place] and has been scheduled for a angiogram at the end of this month -- next Thursday. I have urged him to keep this appointment. 12/30/2014 -- since I saw him last on July 14 he had a procedure done by Dr. Lucky Cowboy on 12/15/2014. he had a aortogram and selective right lower extremity angiogram followed by a percutaneous angioplasty of the right popliteal artery, right anterior tibial artery, right peroneal artery, proximal and mid right posterior tibial artery. 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 Today he continues to have pain and he says that some of his toes are black. Readmission: 06/30/18 on evaluation today patient presents for reevaluation Concerning an altar on the invitation site below his knee. He states this is on the end where there is a ring on the  bottom of his shrinker that seems to rather push against this area. Nonetheless this has been open for several weeks and really didn't seem to be getting better which is why he wanted to come to see [place]. Fortunately this does not appear to be too significant and he is having issues with minimal  drainage which is good news. There's no signs of infection which is also good news. Overall I'm not too worried about the fact that we should be able Lafoy, SHEROD CISSE ([id number]) to get this area to heal quite readily my biggest issue at this point is simply the fact that the patient is continuing to have what sounds like friction to the stump region which is gonna prevent this from being able to fully close and stay closed. He does sometimes wear his prosthesis although he has not for some time simply due to the fact that again he's had this wound. He does have a history of congestive heart failure, peripheral vascular disease, coronary artery disease, and in stage renal disease for which she is on dialysis. 07/10/18 on evaluation today patient actually appears to be doing very well in regard to his ulcer on the distal aspect of the left amputation site this actually appears to be doing very well. Fortunately he's been tolerating the dressing changes without complication. There does not appear to be any signs of infection at this time. Patient History Information obtained from Patient. Family History Cancer - Paternal Grandparents, Diabetes - Father, Heart Disease - Father, Hypertension - Father, No family history of Hereditary Spherocytosis, Kidney Disease, Lung Disease, Seizures, Stroke, Thyroid Problems, Tuberculosis. Social History Current every day smoker, Marital Status - Widowed, Alcohol Use - Never, Drug Use - Prior History, Caffeine Use - Never. Medical History Eyes Denies history of Cataracts, Glaucoma, Optic Neuritis Ear/Nose/Mouth/Throat Denies history of Chronic sinus problems/congestion,  Middle ear problems Hematologic/Lymphatic Patient has history of Anemia Denies history of Hemophilia, Human Immunodeficiency Virus, Lymphedema, Sickle Cell Disease Respiratory Patient has history of Asthma, Chronic Obstructive Pulmonary Disease (COPD), Sleep Apnea Denies history of Aspiration, Pneumothorax, Tuberculosis Cardiovascular Patient has history of Congestive Heart Failure, Coronary Artery Disease, Hypertension, Myocardial Infarction, Peripheral Venous Disease Denies history of Angina, Arrhythmia, Deep Vein Thrombosis, Hypotension, Peripheral Arterial Disease, Phlebitis, Vasculitis Gastrointestinal Denies history of Cirrhosis , Colitis, Crohn s, Hepatitis A, Hepatitis B, Hepatitis C Endocrine Denies history of Type I Diabetes, Type II Diabetes Genitourinary Patient has history of End Stage Renal Disease - HD MWF Immunological Denies history of Lupus Erythematosus, Raynaud s, Scleroderma Integumentary (Skin) Denies history of History of Burn, History of pressure wounds Musculoskeletal Denies history of Gout, Rheumatoid Arthritis, Osteoarthritis, Osteomyelitis Neurologic Patient has history of Neuropathy Denies history of Dementia, Quadriplegia, Paraplegia, Seizure Disorder Oncologic Denies history of Received Chemotherapy, Received Radiation Psychiatric Denies history of Anorexia/bulimia, Confinement Anxiety Hospitalization/Surgery History - 06/27/2014, [place], L BKA. Medical And Surgical History Notes Cervi, SABASTIAN RAIMONDI. ([id number]) Musculoskeletal L BKA Review of Systems (ROS) Constitutional Symptoms (General Health) Denies complaints or symptoms of Fever, Chills. Respiratory The patient has no complaints or symptoms. Cardiovascular The patient has no complaints or symptoms. Psychiatric The patient has no complaints or symptoms. Objective Constitutional Well-nourished and well-hydrated in no acute distress. Vitals Time Taken: 12:47 PM, Height: 74 in, Weight: 210  lbs, BMI: 27, Temperature: 97.8 F, Pulse: 72 bpm, Respiratory Rate: 16 breaths/min, Blood Pressure: 113/63 mmHg. Respiratory normal breathing without difficulty. clear to auscultation bilaterally. Cardiovascular regular rate and rhythm with normal S1, S2. Psychiatric this patient is able to make decisions and demonstrates good insight into disease process. Alert and Oriented x 3. pleasant and cooperative. General Notes: Upon inspection of this time patient's wound bed actually shows signs of excellent epithelialization although a lot of the new skin is very thin I think this needs to be protected this point.  Some of the collagen that was utilized was actually stuck to the tissue surrounding the wound bed this has me somewhat concerned about the possibility of this hearing any new skin this growing. Obviously this is definitely not something we want to happen. Integumentary (Hair, Skin) Wound #6 status is Open. Original cause of wound was Pressure Injury. The wound is located on the Left,Midline Amputation Site - Below Knee. The wound measures 3.2cm length x 4cm width x 0.1cm depth; 10.053cm^2 area and 1.005cm^3 volume. There is Fat Layer (Subcutaneous Tissue) Exposed exposed. There is no tunneling or undermining noted. There is a medium amount of purulent drainage noted. The wound margin is flat and intact. There is small (1-33%) pink, pale granulation within the wound bed. There is a medium (34-66%) amount of necrotic tissue within the wound bed. The periwound skin appearance had no abnormalities noted for texture. The periwound skin appearance had no abnormalities noted for moisture. The periwound skin appearance had no abnormalities noted for color. Periwound temperature was noted as No Abnormality. The periwound has tenderness on palpation. Train, CHRISTYAN REGER ([id number]) Assessment Active Problems ICD-10 Non-pressure chronic ulcer of skin of other sites with fat layer exposed Acquired  absence of left leg below knee Chronic combined systolic (congestive) and diastolic (congestive) heart failure Other specified peripheral vascular diseases Atherosclerotic heart disease of native coronary artery with unspecified angina pectoris Plan Wound Cleansing: Wound #6 Left,Midline Amputation Site - Below Knee: Clean wound with Normal Saline. May Shower, gently pat wound dry prior to applying new dressing. Anesthetic (add to Medication List): Wound #6 Left,Midline Amputation Site - Below Knee: Topical Lidocaine 4% cream applied to wound bed prior to debridement (In Clinic Only). Primary Wound Dressing: Wound #6 Left,Midline Amputation Site - Below Knee: Xeroform Secondary Dressing: Wound #6 Left,Midline Amputation Site - Below Knee: Boardered Foam Dressing Dressing Change Frequency: Wound #6 Left,Midline Amputation Site - Below Knee: Change Dressing Monday, Wednesday, Friday - We'll see patient on Friday Follow-up Appointments: Wound #6 Left,Midline Amputation Site - Below Knee: Return Appointment in 1 week. Off-Loading: Wound #6 Left,Midline Amputation Site - Below Knee: Other: - continue not wearing your prosthesis Home Health: Wound #6 Left,Midline Amputation Site - Below Knee: [place] Nurse may visit PRN to address patient s wound care needs. FACE TO FACE ENCOUNTER: MEDICARE and MEDICAID PATIENTS: I certify that this patient is under my care and that I had a face-to-face encounter that meets the physician face-to-face encounter requirements with this patient on this date. The encounter with the patient was in whole or in part for the following MEDICAL CONDITION: (primary reason for [place]) MEDICAL NECESSITY: I certify, that based on my findings, NURSING services are a medically necessary home health service. HOME BOUND STATUS: I certify that my clinical findings support that this patient is homebound (i.e., Due to illness or  injury, pt requires aid of supportive devices such as crutches, cane, wheelchairs, walkers, the use of special transportation or the assistance of another person to leave their place of residence. There is a normal inability to leave the home and doing so requires considerable and taxing effort. Other absences are for medical reasons / religious services and are infrequent or of short duration when for other reasons). If current dressing causes regression in wound condition, may D/C ordered dressing product/s and apply Normal Saline Moist Dressing daily until next [place] / Other MD appointment. [place] of regression in wound condition at [phone].  Please direct any NON-WOUND related issues/requests for orders to patient's Primary Care Physician Ballin, KORDEL LEAVY ([id number]) My suggestion at this point is gonna be that we go ahead and continue with the management of this wound up on the switch the Xeroform is this would be better to prevent anything from causing injury and damage to the newly developing tissue. The patient is in agreement with plan. We will subsequently see him back for reevaluation in one weeks time to see were things stand. Please see above for specific wound care orders. We will see patient for re-evaluation in 1 week(s) here in the clinic. If anything worsens or changes patient will contact our office for additional recommendations. Electronic Signature(s) Signed: 07/12/2018 7:15:48 AM By: Worthy Keeler PA-C Entered By: Worthy Keeler on 07/10/2018 13:28:12 [place], [name] ([id number]) -------------------------------------------------------------------------------- ROS/PFSH Details Patient Name: Zelek, Ej N. Date of Service: 07/10/2018 12:30 PM Medical Record Number: [id number] Patient Account Number: [id number] Date of Birth/Sex: June 07, 1955 (63 y.o. M) Treating RN: Harold Barban Primary Care Provider: Tomasa Hose Other  Clinician: Referring Provider: Tomasa Hose Treating Provider/Extender: STONE III, HOYT Weeks in Treatment: 1 Information Obtained From Patient Wound History Do you currently have one or more open woundso Yes How many open wounds do you currently haveo 1 Approximately how long have you had your woundso 3 weeks Has your wound(s) ever healed and then re-openedo No Have you had any lab work done in the past montho Yes Have you tested positive for an antibiotic resistant organism (MRSA, VRE)o Yes Have you tested positive for osteomyelitis (bone infection)o No Have you had any tests for circulation on your legso Yes Constitutional Symptoms (General Health) Complaints and Symptoms: Negative for: Fever; Chills Eyes Medical History: Negative for: Cataracts; Glaucoma; Optic Neuritis Ear/Nose/Mouth/Throat Medical History: Negative for: Chronic sinus problems/congestion; Middle ear problems Hematologic/Lymphatic Medical History: Positive for: Anemia Negative for: Hemophilia; Human Immunodeficiency Virus; Lymphedema; Sickle Cell Disease Respiratory Complaints and Symptoms: No Complaints or Symptoms Medical History: Positive for: Asthma; Chronic Obstructive Pulmonary Disease (COPD); Sleep Apnea Negative for: Aspiration; Pneumothorax; Tuberculosis Cardiovascular Complaints and Symptoms: No Complaints or Symptoms Medical History: Positive for: Congestive Heart Failure; Coronary Artery Disease; Hypertension; Myocardial Infarction; Peripheral Venous Aho, Ardell N. ([id number]) Disease Negative for: Angina; Arrhythmia; Deep Vein Thrombosis; Hypotension; Peripheral Arterial Disease; Phlebitis; Vasculitis Gastrointestinal Medical History: Negative for: Cirrhosis ; Colitis; Crohnos; Hepatitis A; Hepatitis B; Hepatitis C Endocrine Medical History: Negative for: Type I Diabetes; Type II Diabetes Genitourinary Medical History: Positive for: End Stage Renal Disease - HD  MWF Immunological Medical History: Negative for: Lupus Erythematosus; Raynaudos; Scleroderma Integumentary (Skin) Medical History: Negative for: History of Burn; History of pressure wounds Musculoskeletal Medical History: Negative for: Gout; Rheumatoid Arthritis; Osteoarthritis; Osteomyelitis Past Medical History Notes: L BKA Neurologic Medical History: Positive for: Neuropathy Negative for: Dementia; Quadriplegia; Paraplegia; Seizure Disorder Oncologic Medical History: Negative for: Received Chemotherapy; Received Radiation Psychiatric Complaints and Symptoms: No Complaints or Symptoms Medical History: Negative for: Anorexia/bulimia; Confinement Anxiety Immunizations Pneumococcal Vaccine: Received Pneumococcal Vaccination: Yes Implantable Devices Sypher, AGNES PROBERT ([id number]) Hospitalization / Surgery History Name of Hospital Purpose of Hospitalization/Sugery Date [facility] L BKA 06/27/2014 Family and Social History Cancer: Yes - Paternal Grandparents; Diabetes: Yes - Father; Heart Disease: Yes - Father; Hereditary Spherocytosis: No; Hypertension: Yes - Father; Kidney Disease: No; Lung Disease: No; Seizures: No; Stroke: No; Thyroid Problems: No; Tuberculosis: No; Current every day smoker; Marital Status - Widowed; Alcohol Use: Never; Drug Use: Prior History; Caffeine Use: Never; Financial Concerns: No;  Food, Games developer or Shelter Needs: No; Support System Lacking: No; Transportation Concerns: No; Advanced Directives: No; Patient does not want information on Advanced Directives Physician Affirmation I have reviewed and agree with the above information. Electronic Signature(s) Signed: 07/12/2018 7:15:48 AM By: Worthy Keeler PA-C Signed: 07/13/2018 9:36:27 AM By: Harold Barban Entered By: Worthy Keeler on 07/10/2018 13:27:31 [place], [name] ([id number]) -------------------------------------------------------------------------------- SuperBill Details Patient Name: Strand,  Dravon N. Date of Service: 07/10/2018 Medical Record Number: [id number] Patient Account Number: [id number] Date of Birth/Sex: Nov 10, 1955 (63 y.o. 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.512 Acquired absence of left leg below knee I50.42 Chronic combined systolic (congestive) and diastolic (congestive) heart failure I73.89 Other specified peripheral vascular diseases I25.119 Atherosclerotic heart disease of native coronary artery with unspecified angina pectoris Facility Procedures CPT4 Code: 74142395 Description: 99213 - WOUND CARE VISIT-LEV 3 EST PT Modifier: Quantity: 1 Physician Procedures CPT4 Code Description: 3202334 35686 - WC PHYS LEVEL 4 - EST PT ICD-10 Diagnosis Description L98.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

## 2018-07-13 NOTE — Progress Notes (Signed)
[name] ([id number]) Visit Report for 07/10/2018 Arrival Information Details Patient Name: [name], [name]. Date of Service: 07/10/2018 12:30 PM Medical Record Number: [id number] Patient Account Number: [id number] Date of Birth/Sex: Dec 18, 1955 (63 y.o. M) Treating RN: Harold Barban Primary Care Santita Hunsberger: Tomasa Hose Other Clinician: Referring Michaele Amundson: Tomasa Hose Treating Kenslee Achorn/Extender: Melburn Hake, HOYT Weeks in Treatment: 1 Visit Information History Since Last Visit Added or deleted any medications: No Patient Arrived: Wheel Chair Any new allergies or adverse reactions: No Arrival Time: 12:46 Had a fall or experienced change in No Accompanied By: Varney Biles from the [place] activities of daily living that may affect Transfer Assistance: None risk of falls: Patient Identification Verified: Yes Signs or symptoms of abuse/neglect since last visito No Secondary Verification Process Yes Hospitalized since last visit: No Completed: Implantable device outside of the clinic excluding No Patient Has Alerts: Yes cellular tissue based products placed in the center Patient Alerts: Patient on Blood since last visit: Thinner Has Dressing in Place as Prescribed: Yes NOT DIABETIC Pain Present Now: No Eliquis Electronic Signature(s) Signed: 07/10/2018 4:08:56 PM By: Lorine Bears RCP, RRT, CHT Entered By: Becky Sax, Amado Nash on 07/10/2018 12:47:25 [place], [name] ([id number]) -------------------------------------------------------------------------------- Clinic Level of Care Assessment Details Patient Name: Golob, [name] Date of Service: 07/10/2018 12:30 PM Medical Record Number: [id number] Patient Account Number: [id number] Date of Birth/Sex: 1956-03-26 (63 y.o. M) Treating RN: Harold Barban Primary Care Angelene Rome: Tomasa Hose Other Clinician: Referring Jerome Viglione: Tomasa Hose Treating Zakk Borgen/Extender: Melburn Hake, HOYT Weeks in Treatment:  1 Clinic Level of Care Assessment Items TOOL 4 Quantity Score []  - Use when only an EandM is performed on FOLLOW-UP visit 0 ASSESSMENTS - Nursing Assessment / Reassessment X - Reassessment of Co-morbidities (includes updates in patient status) 1 10 X- 1 5 Reassessment of Adherence to Treatment Plan ASSESSMENTS - Wound and Skin Assessment / Reassessment X - Simple Wound Assessment / Reassessment - one wound 1 5 []  - 0 Complex Wound Assessment / Reassessment - multiple wounds []  - 0 Dermatologic / Skin Assessment (not related to wound area) ASSESSMENTS - Focused Assessment []  - Circumferential Edema Measurements - multi extremities 0 []  - 0 Nutritional Assessment / Counseling / Intervention []  - 0 Lower Extremity Assessment (monofilament, tuning fork, pulses) []  - 0 Peripheral Arterial Disease Assessment (using hand held doppler) ASSESSMENTS - Ostomy and/or Continence Assessment and Care []  - Incontinence Assessment and Management 0 []  - 0 Ostomy Care Assessment and Management (repouching, etc.) PROCESS - Coordination of Care X - Simple Patient / Family Education for ongoing care 1 15 []  - 0 Complex (extensive) Patient / Family Education for ongoing care X- 1 10 Staff obtains Programmer, systems, Records, Test Results / Process Orders X- 1 10 Staff telephones HHA, Nursing Homes / Clarify orders / etc []  - 0 Routine Transfer to another Facility (non-emergent condition) []  - 0 Routine Hospital Admission (non-emergent condition) []  - 0 New Admissions / Biomedical engineer / Ordering NPWT, Apligraf, etc. []  - 0 Emergency Hospital Admission (emergent condition) X- 1 10 Simple Discharge Coordination Scholze, Ziyon N. ([id number]) []  - 0 Complex (extensive) Discharge Coordination PROCESS - Special Needs []  - Pediatric / Minor Patient Management 0 []  - 0 Isolation Patient Management []  - 0 Hearing / Language / Visual special needs []  - 0 Assessment of Community assistance  (transportation, D/C planning, etc.) []  - 0 Additional assistance / Altered mentation []  - 0 Support Surface(s) Assessment (bed, cushion, seat, etc.) INTERVENTIONS - Wound Cleansing / Measurement X -  Simple Wound Cleansing - one wound 1 5 []  - 0 Complex Wound Cleansing - multiple wounds X- 1 5 Wound Imaging (photographs - any number of wounds) []  - 0 Wound Tracing (instead of photographs) X- 1 5 Simple Wound Measurement - one wound []  - 0 Complex Wound Measurement - multiple wounds INTERVENTIONS - Wound Dressings X - Small Wound Dressing one or multiple wounds 1 10 []  - 0 Medium Wound Dressing one or multiple wounds []  - 0 Large Wound Dressing one or multiple wounds []  - 0 Application of Medications - topical []  - 0 Application of Medications - injection INTERVENTIONS - Miscellaneous []  - External ear exam 0 []  - 0 Specimen Collection (cultures, biopsies, blood, body fluids, etc.) []  - 0 Specimen(s) / Culture(s) sent or taken to Lab for analysis []  - 0 Patient Transfer (multiple staff / Civil Service fast streamer / Similar devices) []  - 0 Simple Staple / Suture removal (25 or less) []  - 0 Complex Staple / Suture removal (26 or more) []  - 0 Hypo / Hyperglycemic Management (close monitor of Blood Glucose) []  - 0 Ankle / Brachial Index (ABI) - do not check if billed separately X- 1 5 Vital Signs Hurley, Kymoni N. ([id number]) Has the patient been seen at the hospital within the last three years: Yes Total Score: 95 Level Of Care: New/Established - Level 3 Electronic Signature(s) Signed: 07/13/2018 9:36:27 AM By: Harold Barban Entered By: Harold Barban on 07/10/2018 13:07:29 [place], [name] ([id number]) -------------------------------------------------------------------------------- Encounter Discharge Information Details Patient Name: Allemand, [name] Date of Service: 07/10/2018 12:30 PM Medical Record Number: [id number] Patient Account Number: [id number] Date of Birth/Sex:  Nov 14, 1955 (63 y.o. M) Treating RN: Montey Hora Primary Care Joao Mccurdy: Tomasa Hose Other Clinician: Referring Elicia Lui: Tomasa Hose Treating Christinna Sprung/Extender: Melburn Hake, HOYT Weeks in Treatment: 1 Encounter Discharge Information Items Discharge Condition: Stable Ambulatory Status: Wheelchair Discharge Destination: Home Transportation: Private Auto Accompanied By: caregiver Schedule Follow-up Appointment: Yes Clinical Summary of Care: Electronic Signature(s) Signed: 07/10/2018 2:36:52 PM By: Montey Hora Entered By: Montey Hora on 07/10/2018 14:36:52 Desa, [name] ([id number]) -------------------------------------------------------------------------------- Lower Extremity Assessment Details Patient Name: Pinzon, Ademola N. Date of Service: 07/10/2018 12:30 PM Medical Record Number: [id number] Patient Account Number: [id number] Date of Birth/Sex: Jan 08, 1956 (63 y.o. M) Treating RN: Army Melia Primary Care Aaliayah Miao: Tomasa Hose Other Clinician: Referring Zena Vitelli: Tomasa Hose Treating Daviyon Widmayer/Extender: Melburn Hake, HOYT Weeks in Treatment: 1 Electronic Signature(s) Signed: 07/13/2018 8:46:05 AM By: Army Melia Entered By: Army Melia on 07/10/2018 12:55:21 Stubblefield, [name] ([id number]) -------------------------------------------------------------------------------- Multi Wound Chart Details Patient Name: Lipuma, Cason N. Date of Service: 07/10/2018 12:30 PM Medical Record Number: [id number] Patient Account Number: [id number] Date of Birth/Sex: [birth year] (63 y.o. M) Treating RN: Harold Barban Primary Care Shanah Guimaraes: Tomasa Hose Other Clinician: Referring Hardie Veltre: Tomasa Hose Treating Conroy Goracke/Extender: STONE III, HOYT Weeks in Treatment: 1 Vital Signs Height(in): 74 Pulse(bpm): 72 Weight(lbs): 210 Blood Pressure(mmHg): 113/63 Body Mass Index(BMI): 27 Temperature(F): 97.8 Respiratory Rate 16 (breaths/min): Photos: [6:No Photos] [N/A:N/A] Wound  Location: [6:Left Amputation Site - Below Knee - Midline] [N/A:N/A] Wounding Event: [6:Pressure Injury] [N/A:N/A] Primary Etiology: [6:Pressure Ulcer] [N/A:N/A] Comorbid History: [6:Anemia, Asthma, Chronic Obstructive Pulmonary Disease (COPD), Sleep Apnea, Congestive Heart Failure, Coronary Artery Disease, Hypertension, Myocardial Infarction, Peripheral Venous Disease, End Stage Renal Disease, Neuropathy]  [N/A:N/A] Date Acquired: [6:06/09/2018] [N/A:N/A] Weeks of Treatment: [6:1] [N/A:N/A] Wound Status: [6:Open] [N/A:N/A] Measurements L x W x D [6:3.2x4x0.1] [N/A:N/A] (cm) Area (cm) : [6:10.053] [N/A:N/A] Volume (cm) : [6:1.005] [N/A:N/A] % Reduction in Area: [6:-8.10%] [  N/A:N/A] % Reduction in Volume: [6:-8.10%] [N/A:N/A] Classification: [6:Category/Stage III] [N/A:N/A] Exudate Amount: [6:Medium] [N/A:N/A] Exudate Type: [6:Purulent] [N/A:N/A] Exudate Color: [6:yellow, brown, green] [N/A:N/A] Wound Margin: [6:Flat and Intact] [N/A:N/A] Granulation Amount: [6:Small (1-33%)] [N/A:N/A] Granulation Quality: [6:Pink, Pale] [N/A:N/A] Necrotic Amount: [6:Medium (34-66%)] [N/A:N/A] Exposed Structures: [6:Fat Layer (Subcutaneous Tissue) Exposed: Yes Fascia: No Tendon: No Muscle: No] [N/A:N/A] Joint: No Bone: No Epithelialization: Small (1-33%) N/A N/A Periwound Skin Texture: Excoriation: No N/A N/A Induration: No Callus: No Crepitus: No Rash: No Scarring: No Periwound Skin Moisture: Maceration: No N/A N/A Dry/Scaly: No Periwound Skin Color: Atrophie Blanche: No N/A N/A Cyanosis: No Ecchymosis: No Erythema: No Hemosiderin Staining: No Mottled: No Pallor: No Rubor: No Temperature: No Abnormality N/A N/A Tenderness on Palpation: Yes N/A N/A Wound Preparation: Ulcer Cleansing: N/A N/A Rinsed/Irrigated with Saline Topical Anesthetic Applied: Other: lidocaine 4% Treatment Notes Electronic Signature(s) Signed: 07/13/2018 9:36:27 AM By: Harold Barban Entered By: Harold Barban on 07/10/2018 13:03:24 Roger, [name] ([id number]) -------------------------------------------------------------------------------- [place] Details Patient Name: Auman, Jamonte N. Date of Service: 07/10/2018 12:30 PM Medical Record Number: [id number] Patient Account Number: [id number] Date of Birth/Sex: 16-Mar-1956 (63 y.o. M) Treating RN: Harold Barban Primary Care Alexsia Klindt: Tomasa Hose Other Clinician: Referring Shenica Holzheimer: Tomasa Hose Treating Janeil Schexnayder/Extender: Melburn Hake, HOYT Weeks in Treatment: 1 Active Inactive Abuse / Safety / Falls / Self Care Management Nursing Diagnoses: Impaired physical mobility Goals: Patient will not develop complications from immobility Date Initiated: 06/30/2018 Target Resolution Date: 09/25/2018 Goal Status: Active Interventions: Assess fall risk on admission and as needed Notes: Nutrition Nursing Diagnoses: Potential for alteratiion in Nutrition/Potential for imbalanced nutrition Goals: Patient/caregiver agrees to and verbalizes understanding of need to use nutritional supplements and/or vitamins as prescribed Date Initiated: 06/30/2018 Target Resolution Date: 09/25/2018 Goal Status: Active Interventions: Assess patient nutrition upon admission and as needed per policy Notes: Orientation to the Wound Care Program Nursing Diagnoses: Knowledge deficit related to the wound healing center program Goals: Patient/caregiver will verbalize understanding of the [place] Program Date Initiated: 06/30/2018 Target Resolution Date: 09/26/2018 Goal Status: Active Interventions: Provide education on orientation to the wound center Hedstrom, Kaynan N. ([id number]) Notes: Wound/Skin Impairment Nursing Diagnoses: Impaired tissue integrity Goals: Ulcer/skin breakdown will heal within 14 weeks Date Initiated: 06/30/2018 Target Resolution Date: 09/25/2018 Goal Status: Active Interventions: Assess patient/caregiver ability  to obtain necessary supplies Assess patient/caregiver ability to perform ulcer/skin care regimen upon admission and as needed Assess ulceration(s) every visit Notes: Electronic Signature(s) Signed: 07/13/2018 9:36:27 AM By: Harold Barban Entered By: Harold Barban on 07/10/2018 13:03:11 [place], [name] ([id number]) -------------------------------------------------------------------------------- Pain Assessment Details Patient Name: Reichard, [name] Date of Service: 07/10/2018 12:30 PM Medical Record Number: [id number] Patient Account Number: [id number] Date of Birth/Sex: 07/09/1955 (63 y.o. M) Treating RN: Harold Barban Primary Care Lorynn Moeser: Tomasa Hose Other Clinician: Referring Kyisha Fowle: Tomasa Hose Treating Sumayya Muha/Extender: STONE III, HOYT Weeks in Treatment: 1 Active Problems Location of Pain Severity and Description of Pain Patient Has Paino No Site Locations Pain Management and Medication Current Pain Management: Electronic Signature(s) Signed: 07/10/2018 4:08:56 PM By: Lorine Bears RCP, RRT, CHT Signed: 07/13/2018 9:36:27 AM By: Harold Barban Entered By: Lorine Bears on 07/10/2018 12:47:31 Marsalis, [name] ([id number]) -------------------------------------------------------------------------------- Patient/Caregiver Education Details Patient Name: Colello, Jadore N. Date of Service: 07/10/2018 12:30 PM Medical Record Number: [id number] Patient Account Number: [id number] Date of Birth/Gender: December 10, 1955 (63 y.o. M) Treating RN: Harold Barban Primary Care Physician: Tomasa Hose Other Clinician: Referring Physician: Tomasa Hose Treating Physician/Extender: Melburn Hake, HOYT  Weeks in Treatment: 1 Education Assessment Education Provided To: Patient Education Topics Provided Wound/Skin Impairment: Handouts: Caring for Your Ulcer Methods: Demonstration, Explain/Verbal Responses: State content correctly Electronic  Signature(s) Signed: 07/13/2018 9:36:27 AM By: Harold Barban Entered By: Harold Barban on 07/10/2018 13:03:39 Gest, [name] ([id number]) -------------------------------------------------------------------------------- Wound Assessment Details Patient Name: Freas, Ladavion N. Date of Service: 07/10/2018 12:30 PM Medical Record Number: [id number] Patient Account Number: [id number] Date of Birth/Sex: 1955-10-14 (62 y.o. M) Treating RN: Army Melia Primary Care Jonella Redditt: Tomasa Hose Other Clinician: Referring Ryenn Howeth: Tomasa Hose Treating Lezlee Gills/Extender: STONE III, HOYT Weeks in Treatment: 1 Wound Status Wound Number: 6 Primary Pressure Ulcer Etiology: Wound Location: Left Amputation Site - Below Knee - Midline Wound Open Status: Wounding Event: Pressure Injury Comorbid Anemia, Asthma, Chronic Obstructive Pulmonary Date Acquired: 06/09/2018 History: Disease (COPD), Sleep Apnea, Congestive Heart Weeks Of Treatment: 1 Failure, Coronary Artery Disease, Hypertension, Clustered Wound: No Myocardial Infarction, Peripheral Venous Disease, End Stage Renal Disease, Neuropathy Photos Photo Uploaded By: Gretta Cool, BSN, RN, CWS, Kim on 07/10/2018 16:03:07 Wound Measurements Length: (cm) 3.2 Width: (cm) 4 Depth: (cm) 0.1 Area: (cm) 10.053 Volume: (cm) 1.005 % Reduction in Area: -8.1% % Reduction in Volume: -8.1% Epithelialization: Small (1-33%) Tunneling: No Undermining: No Wound Description Classification: Category/Stage III Wound Margin: Flat and Intact Exudate Amount: Medium Exudate Type: Purulent Exudate Color: yellow, brown, green Foul Odor After Cleansing: No Slough/Fibrino No Wound Bed Granulation Amount: Small (1-33%) Exposed Structure Granulation Quality: Pink, Pale Fascia Exposed: No Necrotic Amount: Medium (34-66%) Fat Layer (Subcutaneous Tissue) Exposed: Yes Tendon Exposed: No Muscle Exposed: No Joint Exposed: No Bone Exposed: No Scheeler, Knolan N.  ([id number]) Periwound Skin Texture Texture Color No Abnormalities Noted: Yes No Abnormalities Noted: Yes Moisture Temperature / Pain No Abnormalities Noted: Yes Temperature: No Abnormality Tenderness on Palpation: Yes Wound Preparation Ulcer Cleansing: Rinsed/Irrigated with Saline Topical Anesthetic Applied: Other: lidocaine 4%, Treatment Notes Wound #6 (Left, Midline Amputation Site - Below Knee) Notes xeroform, bordered foam dressing Electronic Signature(s) Signed: 07/13/2018 8:46:05 AM By: Army Melia Entered By: Army Melia on 07/10/2018 12:55:11 Salasar, [name] ([id number]) -------------------------------------------------------------------------------- [place] Details Patient Name: Sobalvarro, Talbert N. Date of Service: 07/10/2018 12:30 PM Medical Record Number: [id number] Patient Account Number: [id number] Date of Birth/Sex: [birth year] (63 y.o. M) Treating RN: Harold Barban Primary Care Jaquay Morneault: Tomasa Hose Other Clinician: Referring Maelani Yarbro: Tomasa Hose Treating Raini Tiley/Extender: STONE III, HOYT Weeks in Treatment: 1 Vital Signs Time Taken: 12:47 Temperature (F): 97.8 Height (in): 74 Pulse (bpm): 72 Weight (lbs): 210 Respiratory Rate (breaths/min): 16 Body Mass Index (BMI): 27 Blood Pressure (mmHg): 113/63 Reference Range: 80 - 120 mg / dl Airway Electronic Signature(s) Signed: 07/10/2018 4:08:56 PM By: Lorine Bears RCP, RRT, CHT Entered By: Lorine Bears on 07/10/2018 12:49:46

## 2018-07-14 ENCOUNTER — Encounter: Payer: Self-pay | Admitting: Urology

## 2018-07-14 ENCOUNTER — Ambulatory Visit (INDEPENDENT_AMBULATORY_CARE_PROVIDER_SITE_OTHER): Payer: Medicare Other | Admitting: Urology

## 2018-07-14 ENCOUNTER — Other Ambulatory Visit: Payer: Self-pay

## 2018-07-14 NOTE — Progress Notes (Signed)
07/14/2018 3:36 PM   [name] Nov 13, 1955 [id number]  Referring provider: Donnie Coffin, MD [address] [place] [place], [place] [address]  Chief Complaint  Patient presents with  . 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  Background history  Patient was referred from [facility] ED for balanitis.  He has had a painful penis for over a month.  He states that he is not circumcised  and it is difficult to draw the foreskin back.  He only had one application of the cream and then lost the tube.  Nothing helps the pain.  Nothing makes the pain worse.  10/10 pain.  He has also noticed some drainage from the penis.  Reviewed referral notes - [name] is a 63 y.o. male who presents with pain in his penis. He notes over the last 2-3 weeks he has had worsening pain and burning starting at the tip of his penis and becoming more proximal. He reports severe pain when he tries to retract the foreskin of his penis. Denies fevers or chills. He has never had this before. No penile discharge. He has no concerns about STDs. He does have a history of end-stage renal disease and is compliant with dialysis. He also has peripheral vascular disease as detailed below [name] is a 63 y.o. male this is at least the second attempt to dictated note on this patient the note was previously dictated as at least one other time in the computer lost it completely. Patient was seen 4 days after initial visit and diagnosis of balanitis which was treated with Doxy and a cream. Patient complains of increasing pain and discharge in his penis. Patient's penis is somewhat swollen there is a discharge she is circumcised but has no phimosis or paraphimosis. There appears to be the possibility of a exophytic lesion at the very tip of his penis. Gram stain of  the discharge showed gram-negative diplococci. Patient was given Rocephin for this. Patient was given referral to urology to follow-up.  At his visit on 03/11/2017, he was diagnosed with penile calciphylaxis and referred to nephrology and the wound clinic.    On his 06/10/2017 visit, he stated he was urinating well.  He denied any penile pain.  He was not having fevers, chills, nausea or vomiting.   His PVR was 32 cc.  Today (07/14/2018), he is complaining of a rash where his mask is.  He reports that his penis has improved.  He has no more penile pain.   Patient reports he has been on dialysis for 10 years, and does not produce urine anymore.  PMH: Past Medical History:  Diagnosis Date  . 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 Amputee, below knee, left ([place])   . Anemia   . Asthma   . Cardiomyopathy ([place])   . CHF (congestive heart failure) ([place])   . Chronic systolic heart failure ([place])   . Complication of anesthesia    hypotension  . COPD (chronic obstructive pulmonary disease) ([place])   . Coronary artery disease   . Dialysis patient ([place])    Mon, Wed, Fri  . End stage renal disease ([place])   . GERD (gastroesophageal reflux disease)   . Headache   . History of kidney stones   .  History of pulmonary embolism   . HLD (hyperlipidemia)   . HTN (hypertension)   . Hyperparathyroidism   . Myocardial infarction ([place])   . Peripheral vascular disease ([place])   . Shortness of breath dyspnea   . Sleep apnea    NO C-PAP, Patient stated in process of  "getting one"   . Tobacco dependence     Surgical History: Past Surgical History:  Procedure Laterality Date  . A/V FISTULAGRAM Left 08/14/2017   Procedure: A/V FISTULAGRAM;  Surgeon: Algernon Huxley, MD;  Location: [facility];  Service: Cardiovascular;  Laterality: Left;  . AMPUTATION Left 05/06/2014   Procedure: AMPUTATION BELOW KNEE;  Surgeon: Elam Dutch, MD;  Location: [facility];  Service: Vascular;  Laterality: Left;    . AMPUTATION Right 01/12/2015   Procedure: Foot transmetatarsal amputation;  Surgeon: Algernon Huxley, MD;  Location: [facility];  Service: Vascular;  Laterality: Right;  . APPLICATION OF WOUND VAC Right 03/01/2015   Procedure: Application of Bio-connekt graft and wound vac application to right foot ;  Surgeon: Algernon Huxley, MD;  Location: [facility];  Service: Vascular;  Laterality: Right;  . AV FISTULA PLACEMENT Left   . AV FISTULA PLACEMENT Left 11/28/2016   Procedure: ARTERIOVENOUS (AV) FISTULA CREATION;  Surgeon: Algernon Huxley, MD;  Location: [facility];  Service: Vascular;  Laterality: Left;  . CARDIAC CATHETERIZATION     stent placement   . CORONARY ANGIOPLASTY    . DIALYSIS/PERMA CATHETER INSERTION N/A 07/31/2016   Procedure: Dialysis/Perma Catheter Insertion;  Surgeon: Algernon Huxley, MD;  Location: [facility];  Service: Cardiovascular;  Laterality: N/A;  . DIALYSIS/PERMA CATHETER INSERTION N/A 07/25/2017   Procedure: DIALYSIS/PERMA CATHETER INSERTION and fistulagram;  Surgeon: Algernon Huxley, MD;  Location: [facility];  Service: Cardiovascular;  Laterality: N/A;  . DIALYSIS/PERMA CATHETER REMOVAL N/A 07/22/2017   Procedure: DIALYSIS/PERMA CATHETER REMOVAL;  Surgeon: Katha Cabal, MD;  Location: [facility];  Service: Cardiovascular;  Laterality: N/A;  . DIALYSIS/PERMA CATHETER REMOVAL N/A 02/05/2018   Procedure: DIALYSIS/PERMA CATHETER REMOVAL;  Surgeon: Algernon Huxley, MD;  Location: [facility];  Service: Cardiovascular;  Laterality: N/A;  . IR FLUORO GUIDE CV LINE LEFT  04/10/2017  . LIGATION OF ARTERIOVENOUS  FISTULA Right 01/31/2016   Procedure: LIGATION OF ARTERIOVENOUS  FISTULA;  Surgeon: Algernon Huxley, MD;  Location: [facility];  Service: Vascular;  Laterality: Right;  . PERIPHERAL VASCULAR CATHETERIZATION Right 12/15/2014   Procedure: Lower Extremity Angiography;  Surgeon: Algernon Huxley, MD;  Location: [facility];  Service: Cardiovascular;  Laterality:  Right;  . PERIPHERAL VASCULAR CATHETERIZATION  12/15/2014   Procedure: Lower Extremity Intervention;  Surgeon: Algernon Huxley, MD;  Location: [facility];  Service: Cardiovascular;;  . PERIPHERAL VASCULAR CATHETERIZATION Right 08/14/2015   Procedure: A/V Shuntogram/Fistulagram;  Surgeon: Algernon Huxley, MD;  Location: [facility];  Service: Cardiovascular;  Laterality: Right;  . PERIPHERAL VASCULAR CATHETERIZATION N/A 08/14/2015   Procedure: A/V Shunt Intervention;  Surgeon: Algernon Huxley, MD;  Location: [facility];  Service: Cardiovascular;  Laterality: N/A;  . PERIPHERAL VASCULAR CATHETERIZATION N/A 01/11/2016   Procedure: Dialysis/Perma Catheter Insertion;  Surgeon: Algernon Huxley, MD;  Location: [facility];  Service: Cardiovascular;  Laterality: N/A;  . REVISON OF ARTERIOVENOUS FISTULA Right 02/17/2016   Procedure: removal of AV fistula;  Surgeon: Serafina Mitchell, MD;  Location: [facility];  Service:  Vascular;  Laterality: Right;  . REVISON OF ARTERIOVENOUS FISTULA Right 01/31/2016   Procedure: REVISON OF ARTERIOVENOUS FISTULA ( BRACHIOCEPHALIC ) W/ ARTEGRAFT;  Surgeon: Algernon Huxley, MD;  Location: [facility];  Service: Vascular;  Laterality: Right;  . TRANSMETATARSAL AMPUTATION Right 05/04/2015   Procedure: TRANSMETATARSAL AMPUTATION REVISION, great toe amputation;  Surgeon: Algernon Huxley, MD;  Location: [facility];  Service: Vascular;  Laterality: Right;    Home Medications:  Allergies as of 07/14/2018      Reactions   Dust Mite Extract Other (See Comments)   Reaction: unknown      Medication List       Accurate as of July 14, 2018  3:36 PM. Always use your most recent med list.        acetaminophen 500 MG tablet Commonly known as:  TYLENOL Take 500 mg by mouth every 6 (six) hours as needed for mild pain.   albuterol 108 (90 Base) MCG/ACT inhaler Commonly known as:  PROVENTIL HFA;VENTOLIN HFA Inhale 2 puffs into the lungs every 6 (six) hours as needed for  wheezing or shortness of breath.   albuterol (2.5 MG/3ML) 0.083% nebulizer solution Commonly known as:  PROVENTIL Take 3 mLs (2.5 mg total) by nebulization every 6 (six) hours as needed for wheezing or shortness of breath.   apixaban 5 MG Tabs tablet Commonly known as:  ELIQUIS Take 1 tablet (5 mg total) by mouth 2 (two) times daily.   aspirin EC 325 MG tablet Take 325 mg by mouth daily.   atorvastatin 10 MG tablet Commonly known as:  LIPITOR Take 10 mg by mouth at bedtime.   budesonide-formoterol 160-4.5 MCG/ACT inhaler Commonly known as:  SYMBICORT Inhale 2 puffs into the lungs 2 (two) times daily.   calcium acetate 667 MG capsule Commonly known as:  PHOSLO Take 667 mg by mouth daily.   carvedilol 6.25 MG tablet Commonly known as:  COREG Take 1 tablet (6.25 mg total) by mouth 2 (two) times daily.   cetirizine 10 MG tablet Commonly known as:  ZYRTEC Take 10 mg by mouth daily.   COMBIVENT RESPIMAT 20-100 MCG/ACT Aers respimat Generic drug:  Ipratropium-Albuterol Inhale 1 puff into the lungs every 6 (six) hours as needed for wheezing or shortness of breath.   docusate sodium 100 MG capsule Commonly known as:  COLACE Take 100 mg by mouth daily as needed for mild constipation or moderate constipation.   erythromycin ophthalmic ointment Place 1 application into the right eye at bedtime.   fluticasone 50 MCG/ACT nasal spray Commonly known as:  FLONASE Place 1 spray into both nostrils daily.   hydrocortisone 2.5 % cream Apply 1 application topically 2 (two) times daily as needed (itching).   isosorbide mononitrate 30 MG 24 hr tablet Commonly known as:  IMDUR Take 30 mg by mouth daily.   midodrine 10 MG tablet Commonly known as:  PROAMATINE Take 5 mg by mouth daily as needed.   nitroGLYCERIN 0.4 MG SL tablet Commonly known as:  NITROSTAT Place 0.4 mg under the tongue every 5 (five) minutes as needed for chest pain.   pantoprazole 20 MG tablet Commonly known  as:  PROTONIX Take 20 mg by mouth daily.   polyethylene glycol packet Commonly known as:  MIRALAX / GLYCOLAX Take 17 g by mouth daily as needed for mild constipation.   pregabalin 75 MG capsule Commonly known as:  LYRICA Take 1 capsule (75 mg total) by mouth daily.   ROBAFEN 100 MG/5ML syrup Generic drug:  guaifenesin Take 200 mg by mouth 3 (three) times daily as needed for cough.   sevelamer carbonate 800 MG tablet Commonly known as:  RENVELA Take 800 mg by mouth 3 (three) times daily with meals.   THERA-M PO Take 1 tablet by mouth 2 (two) times daily.   tiotropium 18 MCG inhalation capsule Commonly known as:  SPIRIVA Place 18 mcg into inhaler and inhale daily.       Allergies:  Allergies  Allergen Reactions  . Dust Mite Extract Other (See Comments)    Reaction: unknown    Family History: Family History  Problem Relation Age of Onset  . Leukemia Mother   . Heart attack Father   . Heart failure Other   . Hypertension Other   . Leukemia Other   . Diabetes Other   . Prostate cancer Neg Hx   . Kidney cancer Neg Hx   . Bladder Cancer Neg Hx     Social History:  reports that he has been smoking cigarettes. He has a 7.50 pack-year smoking history. He has never used smokeless tobacco. He reports that he does not drink alcohol or use drugs.  ROS: UROLOGY Frequent Urination?: No Hard to postpone urination?: No Burning/pain with urination?: No Get up at night to urinate?: No Leakage of urine?: No Urine stream starts and stops?: No Trouble starting stream?: No Do you have to strain to urinate?: No Blood in urine?: No Urinary tract infection?: No Sexually transmitted disease?: No Injury to kidneys or bladder?: No Painful intercourse?: No Weak stream?: No Erection problems?: No Penile pain?: No  Gastrointestinal Nausea?: No Vomiting?: No Indigestion/heartburn?: No Diarrhea?: No Constipation?: No  Constitutional Fever: No Night sweats?: No Weight  loss?: No Fatigue?: No  Skin Skin rash/lesions?: Yes Itching?: Yes  Eyes Blurred vision?: No Double vision?: No  Ears/Nose/Throat Sore throat?: No Sinus problems?: No  Hematologic/Lymphatic Swollen glands?: No Easy bruising?: No  Cardiovascular Leg swelling?: No Chest pain?: No  Respiratory Cough?: No Shortness of breath?: No  Endocrine Excessive thirst?: No  Musculoskeletal Back pain?: No Joint pain?: No  Neurological Headaches?: No Dizziness?: No  Psychologic Depression?: No Anxiety?: No  Physical Exam: BP (!) 163/75   Pulse 64   SpO2 92%   Constitutional:  Well nourished. Alert and oriented, No acute distress. Cardiovascular: No clubbing, cyanosis, or edema. Respiratory: Normal respiratory effort, no increased work of breathing. GU: No CVA tenderness. No bladder fullness or masses.  Patient with uncircumcised phallus. Some Phimosis, 1 cm apature for ligo, the dorsal surface of the glans is concave but healthy.  Urethral meatus is patent.  Skin: No rashes, bruises or suspicious lesions. Neurologic: Grossly intact, no focal deficits, moving all 4 extremities. Psychiatric: Normal mood and affect.  Laboratory Data: Lab Results  Component Value Date   WBC 7.2 04/22/2018   HGB 12.1 (L) 04/22/2018   HCT 37.7 (L) 04/22/2018   MCV 96.4 04/22/2018   PLT 174 04/22/2018    Lab Results  Component Value Date   CREATININE 7.83 (H) 04/24/2018   Lab Results  Component Value Date   AST 40 04/22/2018   Lab Results  Component Value Date   ALT 32 04/22/2018   I have reviewed the labs.  Pertinent Imaging  Results for [name], [name] (MRN [id number]) as of 06/10/2017 14:42  Ref. Range 06/10/2017 14:41  Scan Result Unknown 32   Assessment & Plan:    1. Penile calciphylaxis - Patient is doing well; penis is well healed - PVR  on 06/10/2017 was 32 cc  2. Facial Rash - Referred patient to dermatoligist  Return if symptoms worsen or fail to  improve.  Zara Council, PA-C  [facility] [address] [place], [place] [address] [phone]  I, Adele Schilder, am acting as a Education administrator for [employer], PA-C.   I have reviewed the above documentation for accuracy and completeness, and I agree with the above.    Zara Council, PA-C

## 2018-07-17 ENCOUNTER — Ambulatory Visit: Payer: Medicare Other | Admitting: Physician Assistant

## 2018-07-22 ENCOUNTER — Emergency Department
Admission: EM | Admit: 2018-07-22 | Discharge: 2018-07-22 | Disposition: A | Discharge status: Home / Self Care | Payer: Medicare Other | Attending: Emergency Medicine | Admitting: Emergency Medicine

## 2018-07-22 ENCOUNTER — Other Ambulatory Visit: Payer: Self-pay

## 2018-07-22 DIAGNOSIS — Z7982 Long term (current) use of aspirin: Secondary | ICD-10-CM | POA: Insufficient documentation

## 2018-07-22 DIAGNOSIS — F17210 Nicotine dependence, cigarettes, uncomplicated: Secondary | ICD-10-CM | POA: Insufficient documentation

## 2018-07-22 DIAGNOSIS — I5022 Chronic systolic (congestive) heart failure: Secondary | ICD-10-CM | POA: Diagnosis not present

## 2018-07-22 DIAGNOSIS — I2510 Atherosclerotic heart disease of native coronary artery without angina pectoris: Secondary | ICD-10-CM | POA: Insufficient documentation

## 2018-07-22 DIAGNOSIS — I132 Hypertensive heart and chronic kidney disease with heart failure and with stage 5 chronic kidney disease, or end stage renal disease: Secondary | ICD-10-CM | POA: Insufficient documentation

## 2018-07-22 DIAGNOSIS — J449 Chronic obstructive pulmonary disease, unspecified: Secondary | ICD-10-CM | POA: Insufficient documentation

## 2018-07-22 DIAGNOSIS — N186 End stage renal disease: Secondary | ICD-10-CM | POA: Insufficient documentation

## 2018-07-22 DIAGNOSIS — R040 Epistaxis: Secondary | ICD-10-CM | POA: Diagnosis not present

## 2018-07-22 DIAGNOSIS — Z79899 Other long term (current) drug therapy: Secondary | ICD-10-CM | POA: Insufficient documentation

## 2018-07-22 DIAGNOSIS — Z89512 Acquired absence of left leg below knee: Secondary | ICD-10-CM | POA: Insufficient documentation

## 2018-07-22 DIAGNOSIS — Z992 Dependence on renal dialysis: Secondary | ICD-10-CM | POA: Diagnosis not present

## 2018-07-22 DIAGNOSIS — Z89421 Acquired absence of other right toe(s): Secondary | ICD-10-CM | POA: Insufficient documentation

## 2018-07-22 DIAGNOSIS — Z7901 Long term (current) use of anticoagulants: Secondary | ICD-10-CM | POA: Insufficient documentation

## 2018-07-22 DIAGNOSIS — J45909 Unspecified asthma, uncomplicated: Secondary | ICD-10-CM | POA: Insufficient documentation

## 2018-07-22 MED ORDER — LIDOCAINE VISCOUS HCL 2 % MT SOLN
15.0000 mL | Freq: Once | OROMUCOSAL | Status: AC
  Administered 2018-07-22: 15 mL via OROMUCOSAL
  Filled 2018-07-22: qty 15

## 2018-07-22 MED ORDER — OXYMETAZOLINE HCL 0.05 % NA SOLN
1.0000 | Freq: Once | NASAL | Status: AC
  Administered 2018-07-22: 1 via NASAL
  Filled 2018-07-22: qty 30

## 2018-07-22 MED ORDER — AMOXICILLIN-POT CLAVULANATE 875-125 MG PO TABS: 1.0000 | ORAL_TABLET | Freq: Two times a day (BID) | ORAL | 0 refills | Status: AC

## 2018-07-22 NOTE — ED Triage Notes (Signed)
Patient arrived from [facility] by [employer]. Patient has had a nose bleed since 430 pm 07/21/2018. Patient is on blood thinners with a history of high BP, and currently receives dialysis. Prior to patient [facility] to [facility] patient told EMS he was having chest pain.

## 2018-07-22 NOTE — ED Notes (Signed)
Nasal clamp applied with guaze under to prevent slippage. Pt given a yankar and instructions to spit into the yankar.

## 2018-07-22 NOTE — ED Notes (Signed)
ENT cart at bedside

## 2018-07-22 NOTE — ED Provider Notes (Signed)
[facility] Emergency Department Provider Note   ____________________________________________    I have reviewed the triage vital signs and the nursing notes.   HISTORY  Chief Complaint Epistaxis     HPI [name] is a 63 y.o. male who presents with complaints of bleeding from his right nare.  Reports this is been going on all day, he has been unable to stop it with tissue or holding pressure.  Denies trauma.  On blood thinners.  Is on dialysis  Past Medical History:  Diagnosis Date  . 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 Amputee, below knee, left ([place])   . Anemia   . Asthma   . Cardiomyopathy ([place])   . CHF (congestive heart failure) ([place])   . Chronic systolic heart failure ([place])   . Complication of anesthesia    hypotension  . COPD (chronic obstructive pulmonary disease) ([place])   . Coronary artery disease   . Dialysis patient ([place])    Mon, Wed, Fri  . End stage renal disease ([place])   . GERD (gastroesophageal reflux disease)   . Headache   . History of kidney stones   . History of pulmonary embolism   . HLD (hyperlipidemia)   . HTN (hypertension)   . Hyperparathyroidism   . Myocardial infarction ([place])   . Peripheral vascular disease ([place])   . Shortness of breath dyspnea   . Sleep apnea    NO C-PAP, Patient stated in process of  "getting one"   . Tobacco dependence     Patient Active Problem List   Diagnosis Date Noted  . HCAP (healthcare-associated pneumonia) 04/22/2018  . Acute on chronic respiratory failure with hypoxia ([place]) 12/21/2017  . Tobacco use 10/07/2017  . Apnea   . End-stage renal disease on hemodialysis ([place])   . Hypervolemia   . Altered mental status   . Acute on chronic systolic CHF (congestive heart failure) ([place]) 01/18/2017  . Pulmonary embolism ([place]) 08/03/2016  . NSTEMI (non-ST elevated myocardial infarction) ([place]) 07/26/2016  . PVD (peripheral vascular disease) ([place]) 03/05/2016  . Preop  examination 05/02/2015  . Chronic systolic heart failure ([place])   . Hyperkalemia 01/13/2015  . Normocytic anemia 05/04/2014  . Hyperlipidemia 04/26/2010  . HYPERTENSION, BENIGN 04/26/2010  . CAD, NATIVE VESSEL 04/26/2010    Past Surgical History:  Procedure Laterality Date  . A/V FISTULAGRAM Left 08/14/2017   Procedure: A/V FISTULAGRAM;  Surgeon: Algernon Huxley, MD;  Location: [place] [facility];  Service: Cardiovascular;  Laterality: Left;  . AMPUTATION Left 05/06/2014   Procedure: AMPUTATION BELOW KNEE;  Surgeon: Elam Dutch, MD;  Location: [place];  Service: Vascular;  Laterality: Left;  . AMPUTATION Right 01/12/2015   Procedure: Foot transmetatarsal amputation;  Surgeon: Algernon Huxley, MD;  Location: [facility];  Service: Vascular;  Laterality: Right;  . APPLICATION OF WOUND VAC Right 03/01/2015   Procedure: Application of Bio-connekt graft and wound vac application to right foot ;  Surgeon: Algernon Huxley, MD;  Location: [facility];  Service: Vascular;  Laterality: Right;  . AV FISTULA PLACEMENT Left   . AV FISTULA PLACEMENT Left 11/28/2016   Procedure: ARTERIOVENOUS (AV) FISTULA CREATION;  Surgeon: Algernon Huxley, MD;  Location: [facility];  Service: Vascular;  Laterality: Left;  . CARDIAC CATHETERIZATION     stent placement   . CORONARY ANGIOPLASTY    . DIALYSIS/PERMA CATHETER INSERTION N/A 07/31/2016   Procedure: Dialysis/Perma Catheter Insertion;  Surgeon:  Algernon Huxley, MD;  Location: [place] [facility];  Service: Cardiovascular;  Laterality: N/A;  . DIALYSIS/PERMA CATHETER INSERTION N/A 07/25/2017   Procedure: DIALYSIS/PERMA CATHETER INSERTION and fistulagram;  Surgeon: Algernon Huxley, MD;  Location: [place] [facility];  Service: Cardiovascular;  Laterality: N/A;  . DIALYSIS/PERMA CATHETER REMOVAL N/A 07/22/2017   Procedure: DIALYSIS/PERMA CATHETER REMOVAL;  Surgeon: Katha Cabal, MD;  Location: [place] [facility];  Service: Cardiovascular;  Laterality: N/A;  . DIALYSIS/PERMA CATHETER  REMOVAL N/A 02/05/2018   Procedure: DIALYSIS/PERMA CATHETER REMOVAL;  Surgeon: Algernon Huxley, MD;  Location: [place] [facility];  Service: Cardiovascular;  Laterality: N/A;  . IR FLUORO GUIDE CV LINE LEFT  04/10/2017  . LIGATION OF ARTERIOVENOUS  FISTULA Right 01/31/2016   Procedure: LIGATION OF ARTERIOVENOUS  FISTULA;  Surgeon: Algernon Huxley, MD;  Location: [facility];  Service: Vascular;  Laterality: Right;  . PERIPHERAL VASCULAR CATHETERIZATION Right 12/15/2014   Procedure: Lower Extremity Angiography;  Surgeon: Algernon Huxley, MD;  Location: [place] [facility];  Service: Cardiovascular;  Laterality: Right;  . PERIPHERAL VASCULAR CATHETERIZATION  12/15/2014   Procedure: Lower Extremity Intervention;  Surgeon: Algernon Huxley, MD;  Location: [place] [facility];  Service: Cardiovascular;;  . PERIPHERAL VASCULAR CATHETERIZATION Right 08/14/2015   Procedure: A/V Shuntogram/Fistulagram;  Surgeon: Algernon Huxley, MD;  Location: [place] [facility];  Service: Cardiovascular;  Laterality: Right;  . PERIPHERAL VASCULAR CATHETERIZATION N/A 08/14/2015   Procedure: A/V Shunt Intervention;  Surgeon: Algernon Huxley, MD;  Location: [place] [facility];  Service: Cardiovascular;  Laterality: N/A;  . PERIPHERAL VASCULAR CATHETERIZATION N/A 01/11/2016   Procedure: Dialysis/Perma Catheter Insertion;  Surgeon: Algernon Huxley, MD;  Location: [place] [facility];  Service: Cardiovascular;  Laterality: N/A;  . REVISON OF ARTERIOVENOUS FISTULA Right 02/17/2016   Procedure: removal of AV fistula;  Surgeon: Serafina Mitchell, MD;  Location: [facility];  Service: Vascular;  Laterality: Right;  . REVISON OF ARTERIOVENOUS FISTULA Right 01/31/2016   Procedure: REVISON OF ARTERIOVENOUS FISTULA ( BRACHIOCEPHALIC ) W/ ARTEGRAFT;  Surgeon: Algernon Huxley, MD;  Location: [facility];  Service: Vascular;  Laterality: Right;  . TRANSMETATARSAL AMPUTATION Right 05/04/2015   Procedure: TRANSMETATARSAL AMPUTATION REVISION, great toe amputation;  Surgeon: Algernon Huxley, MD;  Location: [facility];  Service: Vascular;  Laterality: Right;    Prior to Admission medications   Medication Sig Start Date End Date Taking? Authorizing Provider  acetaminophen (TYLENOL) 500 MG tablet Take 500 mg by mouth every 6 (six) hours as needed for mild pain.     [provider]  albuterol (PROVENTIL HFA;VENTOLIN HFA) 108 (90 Base) MCG/ACT inhaler Inhale 2 puffs into the lungs every 6 (six) hours as needed for wheezing or shortness of breath.     [provider]  albuterol (PROVENTIL) (2.5 MG/3ML) 0.083% nebulizer solution Take 3 mLs (2.5 mg total) by nebulization every 6 (six) hours as needed for wheezing or shortness of breath. 04/24/18   Gladstone Lighter, MD  amoxicillin-clavulanate (AUGMENTIN) 875-125 MG tablet Take 1 tablet by mouth 2 (two) times daily for 7 days. 07/22/18 07/29/18  Lavonia Drafts, MD  apixaban (ELIQUIS) 5 MG TABS tablet Take 1 tablet (5 mg total) by mouth 2 (two) times daily. 01/20/17   Gladstone Lighter, MD  aspirin EC 325 MG tablet Take 325 mg by mouth daily.     [provider]  atorvastatin (LIPITOR) 10 MG tablet Take 10 mg by mouth at bedtime.  [provider]  budesonide-formoterol (SYMBICORT) 160-4.5 MCG/ACT inhaler Inhale 2 puffs into the lungs 2 (two) times daily.    [provider]  calcium acetate (PHOSLO) 667 MG capsule Take 667 mg by mouth daily.     [provider]  carvedilol (COREG) 6.25 MG tablet Take 1 tablet (6.25 mg total) by mouth 2 (two) times daily. 10/07/17   Alisa Graff, FNP  cetirizine (ZYRTEC) 10 MG tablet Take 10 mg by mouth daily.    [provider]  docusate sodium (COLACE) 100 MG capsule Take 100 mg by mouth daily as needed for mild constipation or moderate constipation.     [provider]  erythromycin ophthalmic ointment Place 1 application into the right eye at bedtime.    [provider]  fluticasone (FLONASE) 50 MCG/ACT nasal spray Place 1  spray into both nostrils daily.     [provider]  guaifenesin (ROBAFEN) 100 MG/5ML syrup Take 200 mg by mouth 3 (three) times daily as needed for cough.    [provider]  hydrocortisone 2.5 % cream Apply 1 application topically 2 (two) times daily as needed (itching).     [provider]  Ipratropium-Albuterol (COMBIVENT RESPIMAT) 20-100 MCG/ACT AERS respimat Inhale 1 puff into the lungs every 6 (six) hours as needed for wheezing or shortness of breath.    [provider]  isosorbide mononitrate (IMDUR) 30 MG 24 hr tablet Take 30 mg by mouth daily.  08/22/16   [provider]  midodrine (PROAMATINE) 10 MG tablet Take 5 mg by mouth daily as needed.     [provider]  Multiple Vitamins-Minerals (THERA-M PO) Take 1 tablet by mouth 2 (two) times daily.    [provider]  nitroGLYCERIN (NITROSTAT) 0.4 MG SL tablet Place 0.4 mg under the tongue every 5 (five) minutes as needed for chest pain.    [provider]  pantoprazole (PROTONIX) 20 MG tablet Take 20 mg by mouth daily.    [provider]  polyethylene glycol (MIRALAX / GLYCOLAX) packet Take 17 g by mouth daily as needed for mild constipation.     [provider]  pregabalin (LYRICA) 75 MG capsule Take 1 capsule (75 mg total) by mouth daily. 01/22/17   Gladstone Lighter, MD  sevelamer carbonate (RENVELA) 800 MG tablet Take 800 mg by mouth 3 (three) times daily with meals.     [provider]  tiotropium (SPIRIVA) 18 MCG inhalation capsule Place 18 mcg into inhaler and inhale daily.    [provider]     Allergies Dust mite extract  Family History  Problem Relation Age of Onset  . Leukemia Mother   . Heart attack Father   . Heart failure Other   . Hypertension Other   . Leukemia Other   . Diabetes Other   . Prostate cancer Neg Hx   . Kidney cancer Neg Hx   . Bladder Cancer Neg Hx     Social History Social History    Tobacco Use  . Smoking status: Light Tobacco Smoker    Packs/day: 0.25    Years: 30.00    Pack years: 7.50    Types: Cigarettes  . Smokeless tobacco: Never Used  . Tobacco comment: 5  Substance Use Topics  . Alcohol use: No    Alcohol/week: 0.0 standard drinks  . Drug use: No    Comment: has used crack cocaine in past     Review of Systems  Constitutional: No  fever/chills Eyes: No visual changes.  ENT: As above Cardiovascular: Denies chest pain. Respiratory: Denies shortness of breath. Gastrointestinal: No abdominal pain.  Genitourinary: Negative for dysuria. Musculoskeletal: Negative for back pain. Skin: Negative for rash. Neurological: Negative for headaches   ____________________________________________   PHYSICAL EXAM:  VITAL SIGNS: ED Triage Vitals  Enc Vitals Group     BP 07/22/18 0049 (!) 156/86     Pulse Rate 07/22/18 0049 80     Resp 07/22/18 0049 18     Temp 07/22/18 0049 98.1 F (36.7 C)     Temp Source 07/22/18 0049 Oral     SpO2 07/22/18 0049 97 %     Weight 07/22/18 0050 97.5 kg (215 lb)     Height 07/22/18 0050 1.88 m (6\' 2" )     Head Circumference --      Peak Flow --      Pain Score 07/22/18 0049 1     Pain Loc --      Pain Edu? --      Excl. in [place]? --     Constitutional: Alert and oriented. No acute distress. Pleasant and interactive Eyes: Conjunctivae are normal.  Head: Atraumatic. Nose: Oozing from the right nare Mouth/Throat: Mucous membranes are moist.    Cardiovascular: Normal rate, regular rhythm.   Good peripheral circulation. Respiratory: Normal respiratory effort.  No retractions Gastrointestinal: Soft and nontender. No distention.    Musculoskeletal: Left BKA.  Warm and well perfused Neurologic:  Normal speech and language. No gross focal neurologic deficits are appreciated.  Skin:  Skin is warm, dry and intact. No rash noted. Psychiatric: Mood and affect are normal. Speech and behavior are  normal.  ____________________________________________   LABS (all labs ordered are listed, but only abnormal results are displayed)  Labs Reviewed - No data to display ____________________________________________  EKG  ED ECG REPORT I, Lavonia Drafts, the attending physician, personally viewed and interpreted this ECG.  Date: 07/22/2018  Rhythm: normal sinus rhythm QRS Axis: normal Intervals: Left bundle branch block ST/T Wave abnormalities: normal Narrative Interpretation: no evidence of acute ischemia  ____________________________________________  RADIOLOGY  None ____________________________________________   PROCEDURES  Procedure(s) performed: yes  .Epistaxis Management Date/Time: 07/22/2018 3:56 AM Performed by: Lavonia Drafts, MD Authorized by: Lavonia Drafts, MD   Consent:    Consent obtained:  Verbal   Consent given by:  Patient   Risks discussed:  Bleeding, infection, nasal injury and pain Anesthesia (see MAR for exact dosages):    Anesthesia method:  Topical application   Topical anesthetic:  Lidocaine gel Procedure details:    Treatment site:  R anterior   Treatment method:  Merocel sponge   Treatment complexity:  Limited   Treatment episode: initial   Post-procedure details:    Assessment:  Bleeding stopped   Patient tolerance of procedure:  Tolerated well, no immediate complications     Critical Care performed: No ____________________________________________   INITIAL IMPRESSION / ASSESSMENT AND PLAN / ED COURSE  Pertinent labs & imaging results that were available during my care of the patient were reviewed by me and considered in my medical decision making (see chart for details).  Patient with mild oozing from the right nare, placed nasal clamp, unsuccessful in stopping the bleeding.  Afrin seem to help however the bleeding started again.  Decided to place a Merocel sponge with resolution of bleeding, start the patient on Augmentin,  follow-up with ENT    ____________________________________________   FINAL CLINICAL IMPRESSION(S) / ED DIAGNOSES  Final diagnoses:  Epistaxis        Note:  This document was prepared using Dragon voice recognition software and may include unintentional dictation errors.   Lavonia Drafts, MD 07/22/18 0400

## 2018-07-22 NOTE — ED Notes (Signed)
Nasal clamp applied to patient nose to control bleeding.

## 2018-07-22 NOTE — ED Notes (Signed)
Bleeding still occurring. MD made aware. Nasal balloon to be placed before discharge.

## 2018-07-24 ENCOUNTER — Encounter: Payer: Medicare Other | Admitting: Physician Assistant

## 2018-07-24 DIAGNOSIS — T8789 Other complications of amputation stump: Secondary | ICD-10-CM | POA: Diagnosis not present

## 2018-07-26 NOTE — Progress Notes (Signed)
Hoffmeier, ZEBADIAH WILLERT ([id number]) Visit Report for 07/24/2018 Chief Complaint Document Details Patient Name: [name], [name]. Date of Service: 07/24/2018 1:45 PM Medical Record Number: [id number] Patient Account Number: [id number] Date of Birth/Sex: [birth year] (63 y.o. M) Treating RN: Harold Barban Primary Care Provider: Tomasa Hose Other Clinician: Referring Provider: Tomasa Hose Treating Provider/Extender: Melburn Hake, HOYT Weeks in Treatment: 3 Information Obtained from: Patient Chief Complaint Left stump pressure ulcer Electronic Signature(s) Signed: 07/24/2018 5:19:05 PM By: Worthy Keeler PA-C Entered By: Worthy Keeler on 07/24/2018 13:53:32 [name], [name] ([id number]) -------------------------------------------------------------------------------- Debridement Details Patient Name: [name], [name]. Date of Service: 07/24/2018 1:45 PM Medical Record Number: [id number] Patient Account Number: [id number] Date of Birth/Sex: [birth year] (63 y.o. M) Treating RN: Harold Barban Primary Care Provider: Tomasa Hose Other Clinician: Referring Provider: Tomasa Hose Treating Provider/Extender: Melburn Hake, HOYT Weeks in Treatment: 3 Debridement Performed for Wound #6 Left,Midline Amputation Site - Below Knee Assessment: Performed By: Physician STONE III, HOYT E., PA-C Debridement Type: Debridement Level of Consciousness (Pre- Awake and Alert procedure): Pre-procedure Verification/Time Yes - 14:36 Out Taken: Start Time: 14:36 Pain Control: Lidocaine Total Area Debrided (L x W): 3.2 (cm) x 4 (cm) = 12.8 (cm) Tissue and other material Non-Viable, Slough, Subcutaneous, Slough debrided: Level: Skin/Subcutaneous Tissue Debridement Description: Excisional Instrument: Curette Bleeding: Minimum Hemostasis Achieved: Pressure End Time: 14:39 Procedural Pain: 0 Post Procedural Pain: 0 Response to Treatment: Procedure was tolerated well Level of Consciousness Awake and  Alert (Post-procedure): Post Debridement Measurements of Total Wound Length: (cm) 3 Stage: Category/Stage III Width: (cm) 3.5 Depth: (cm) 0.1 Volume: (cm) 0.825 Character of Wound/Ulcer Post Improved Debridement: Post Procedure Diagnosis Same as Pre-procedure Electronic Signature(s) Signed: 07/24/2018 5:19:05 PM By: Worthy Keeler PA-C Signed: 07/24/2018 5:19:20 PM By: Harold Barban Entered By: Harold Barban on 07/24/2018 14:40:25 [name], [name] ([id number]) -------------------------------------------------------------------------------- HPI Details Patient Name: [name], [name] Date of Service: 07/24/2018 1:45 PM Medical Record Number: [id number] Patient Account Number: [id number] Date of Birth/Sex: Sep 19, 1955 (63 y.o. M) Treating RN: Harold Barban Primary Care Provider: Tomasa Hose Other Clinician: Referring Provider: Tomasa Hose Treating Provider/Extender: Melburn Hake, HOYT Weeks in Treatment: 3 History of Present Illness HPI Description: 63 year old male who was seen by others 2-1/2 years ago when he was being treated for vascular problems and dry gangrene of his right foot now returns with painful lesion on his penis for about a month. He was seen by the physicians assistant at the urology practice,who noted a easily retracted foreskin, patent urethral meatus and on the dorsal surface of the glands there was slough with healthy granulation tissue formation. 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 the patient is a smoker smoking about 10 cigarettes a day. past medical history of amputation of the right foot, left foot amputation, cardiomyopathy, CHF, hemodialysis dependent, coronary artery disease, tobacco addiction. Seen by Dr. Leotis Pain on 08/29/2014 with ulceration on 2 toes on the right foot and dark discoloration worrisome for gangrenous changes on his 2nd and 3rd toes as well.o  Given his previous history of below-knee amputation for peripheral disease and ulceration on the left, his multiple atherosclerotic risk factors and his poorly palpable pedal pulses, he was brought in for angiography for further evaluation and potential treatment. He had a percutaneous angioplasty of the right popliteal artery and the right anterior tibial artery and dorsalis pedis artery. He also had angioplasty of the right peroneal artery and the proximal  and mid right posterior tibial artery. On 11/07/2014 he's had a duplex of the right lower extremity which showed the ABI was 0.9 and had a multiphasic waveform. Impression was of mild peripheral vascular disease predominantly small vessel disease right ankle. 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 11/22/2014 -- he has been off cigarettes completely since Saturday and is on a nicotine patch and have commended him about this. 12/08/2014 -- he was seen in the vascular office at [place] and has been scheduled for a angiogram at the end of this month -- next Thursday. I have urged him to keep this appointment. 12/30/2014 -- since I saw him last on July 14 he had a procedure done by Dr. Lucky Cowboy on 12/15/2014. he had a aortogram and selective right lower extremity angiogram followed by a percutaneous angioplasty of the right popliteal artery, right anterior tibial artery, right peroneal artery, proximal and mid right posterior tibial artery. 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 Today he continues to have pain and he says that some of his toes are black. Readmission: 06/30/18 on evaluation today patient presents for reevaluation Concerning an altar on the invitation site below his knee. He states this is on the end where  there is a ring on the bottom of his shrinker that seems to rather push against this area. Nonetheless this has been open for several weeks and really didn't seem to be getting better which is why he wanted to come to see [place]. Fortunately this does not appear to be too significant and he is having issues with minimal drainage which is good news. There's no signs of infection which is also good news. Overall I'm not too worried about the fact that we should be able to get this area to heal quite readily my biggest issue at this point is simply the fact that the patient is continuing to have what sounds like friction to the stump region which is gonna prevent this from being able to fully close and stay closed. He does sometimes wear his prosthesis although he has not for some time simply due to the fact that again he's had this wound. He does have a history of congestive heart failure, peripheral vascular disease, coronary artery disease, and in stage renal disease for which she is on dialysis. [name], [name] ([id number]) 07/10/18 on evaluation today patient actually appears to be doing very well in regard to his ulcer on the distal aspect of the left amputation site this actually appears to be doing very well. Fortunately he's been tolerating the dressing changes without complication. There does not appear to be any signs of infection at this time. 07/24/18 on evaluation today patient appears to be doing rather well in regard to his distal imputation site also. With that being said there is a lot of necrotic tissue buildup on the end of the wound I think this is due to too much moisture at the site. Subsequently I did discuss sharp debridement today which hopefully will help this to heal more appropriately we also need to do some better moisture management in my opinion. Electronic Signature(s) Signed: 07/24/2018 5:19:05 PM By: Worthy Keeler PA-C Entered By: Worthy Keeler on 07/24/2018  14:49:29 [name], [name] ([id number]) -------------------------------------------------------------------------------- Physical Exam Details  Patient Name: Ludvigsen, ELIZEO RODRIQUES. Date of Service: 07/24/2018 1:45 PM Medical Record Number: [id number] Patient Account Number: [id number] Date of Birth/Sex: 1955-08-14 (62 y.o. M) Treating RN: Harold Barban Primary Care Provider: Tomasa Hose Other Clinician: Referring Provider: Tomasa Hose Treating Provider/Extender: STONE III, HOYT Weeks in Treatment: 3 Constitutional Well-nourished and well-hydrated in no acute distress. Respiratory normal breathing without difficulty. clear to auscultation bilaterally. Cardiovascular regular rate and rhythm with normal S1, S2. Psychiatric this patient is able to make decisions and demonstrates good insight into disease process. Alert and Oriented x 3. pleasant and cooperative. Notes Patient's wound bed did require sharp debridement today I was able to perform this without complication post debridement wound bed appears to be doing thinking we better which is great news. Overall I'm very pleased with how things appear he did have some discomfort but was able to get the agreement without complication. Electronic Signature(s) Signed: 07/24/2018 5:19:05 PM By: Worthy Keeler PA-C Entered By: Worthy Keeler on 07/24/2018 14:49:55 [name], [name] ([id number]) -------------------------------------------------------------------------------- Physician Orders Details Patient Name: [name], Jontavius N. Date of Service: 07/24/2018 1:45 PM Medical Record Number: [id number] Patient Account Number: [id number] Date of Birth/Sex: 1955-10-22 (62 y.o. M) Treating RN: Harold Barban Primary Care Provider: Tomasa Hose Other Clinician: Referring Provider: Tomasa Hose Treating Provider/Extender: Melburn Hake, HOYT Weeks in Treatment: 3 Verbal / Phone Orders: No Diagnosis Coding ICD-10 Coding Code Description L98.492  Non-pressure chronic ulcer of skin of other sites with fat layer exposed Z89.512 Acquired absence of left leg below knee I50.42 Chronic combined systolic (congestive) and diastolic (congestive) heart failure I73.89 Other specified peripheral vascular diseases I25.119 Atherosclerotic heart disease of native coronary artery with unspecified angina pectoris Wound Cleansing Wound #6 Left,Midline Amputation Site - Below Knee o Clean wound with Normal Saline. o May Shower, gently pat wound dry prior to applying new dressing. Anesthetic (add to Medication List) Wound #6 Left,Midline Amputation Site - Below Knee o Topical Lidocaine 4% cream applied to wound bed prior to debridement (In Clinic Only). Primary Wound Dressing Wound #6 Left,Midline Amputation Site - Below Knee o Silver Alginate Secondary Dressing Wound #6 Left,Midline Amputation Site - Below Knee o Boardered Foam Dressing Dressing Change Frequency Wound #6 Left,Midline Amputation Site - Below Knee o Change Dressing Monday, Wednesday, Friday - We'll see patient on Friday Follow-up Appointments Wound #6 Left,Midline Amputation Site - Below Knee o Return Appointment in 1 week. Off-Loading Wound #6 Left,Midline Amputation Site - Below Knee o Other: - continue not wearing your prosthesis Home Health Wound #6 Left,Midline Amputation Site - Below Knee o [place] Visits ALFONSA, VAILE ([id number]) o [place] Nurse may visit PRN to address patientos wound care needs. o FACE TO FACE ENCOUNTER: MEDICARE and MEDICAID PATIENTS: I certify that this patient is under my care and that I had a face-to-face encounter that meets the physician face-to-face encounter requirements with this patient on this date. The encounter with the patient was in whole or in part for the following MEDICAL CONDITION: (primary reason for [place]) MEDICAL NECESSITY: I certify, that based on my findings, NURSING services  are a medically necessary home health service. HOME BOUND STATUS: I certify that my clinical findings support that this patient is homebound (i.e., Due to illness or injury, pt requires aid of supportive devices such as crutches, cane, wheelchairs, walkers, the use of special transportation or the assistance of another person to leave their place of residence. There is a normal inability to leave the home  and doing so requires considerable and taxing effort. Other absences are for medical reasons / religious services and are infrequent or of short duration when for other reasons). o If current dressing causes regression in wound condition, may D/C ordered dressing product/s and apply Normal Saline Moist Dressing daily until next [place] / Other MD appointment. [place] of regression in wound condition at [phone]. o Please direct any NON-WOUND related issues/requests for orders to patient's Primary Care Physician Electronic Signature(s) Signed: 07/24/2018 5:19:05 PM By: Worthy Keeler PA-C Signed: 07/24/2018 5:19:20 PM By: Harold Barban Entered By: Harold Barban on 07/24/2018 14:42:26 [name], [name] ([id number]) -------------------------------------------------------------------------------- Problem List Details Patient Name: [name], [name] Date of Service: 07/24/2018 1:45 PM Medical Record Number: [id number] Patient Account Number: [id number] Date of Birth/Sex: [birth year] (62 y.o. M) Treating RN: Harold Barban Primary Care Provider: Tomasa Hose Other Clinician: Referring Provider: Tomasa Hose Treating Provider/Extender: Melburn Hake, HOYT Weeks in Treatment: 3 Active Problems ICD-10 Evaluated Encounter Code Description Active Date Today Diagnosis L98.492 Non-pressure chronic ulcer of skin of other sites with fat layer 06/30/2018 No Yes exposed Z89.512 Acquired absence of left leg below knee 06/30/2018 No Yes I50.42 Chronic combined systolic  (congestive) and diastolic 06/30/5359 No Yes (congestive) heart failure I73.89 Other specified peripheral vascular diseases 06/30/2018 No Yes I25.119 Atherosclerotic heart disease of native coronary artery with 06/30/2018 No Yes unspecified angina pectoris Inactive Problems Resolved Problems Electronic Signature(s) Signed: 07/24/2018 5:19:05 PM By: Worthy Keeler PA-C Entered By: Worthy Keeler on 07/24/2018 13:53:27 [name], [name] ([id number]) -------------------------------------------------------------------------------- Progress Note Details Patient Name: [name], [name] Date of Service: 07/24/2018 1:45 PM Medical Record Number: [id number] Patient Account Number: [id number] Date of Birth/Sex: Sep 30, 1955 (62 y.o. M) Treating RN: Harold Barban Primary Care Provider: Tomasa Hose Other Clinician: Referring Provider: Tomasa Hose Treating Provider/Extender: Melburn Hake, HOYT Weeks in Treatment: 3 Subjective Chief Complaint Information obtained from Patient Left stump pressure ulcer History of Present Illness (HPI) 63 year old male who was seen by others 2-1/2 years ago when he was being treated for vascular problems and dry gangrene of his right foot now returns with painful lesion on his penis for about a month. He was seen by the physicians assistant at the urology practice,who noted a easily retracted foreskin, patent urethral meatus and on the dorsal surface of the glands there was slough with healthy granulation tissue formation. 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 the patient is a smoker smoking about 10 cigarettes a day. past medical history of amputation of the right foot, left foot amputation, cardiomyopathy, CHF, hemodialysis dependent, coronary artery disease, tobacco addiction. Seen by Dr. Leotis Pain on 08/29/2014 with ulceration on 2 toes on the right foot and dark discoloration worrisome  for gangrenous changes on his 2nd and 3rd toes as well. Given his previous history of below-knee amputation for peripheral disease and ulceration on the left, his multiple atherosclerotic risk factors and his poorly palpable pedal pulses, he was brought in for angiography for further evaluation and potential treatment. He had a percutaneous angioplasty of the right popliteal artery and the right anterior tibial artery and dorsalis pedis artery. He also had angioplasty of the right peroneal artery and the proximal and mid right posterior tibial artery. On 11/07/2014 he's had a duplex of the right lower extremity which showed the ABI was 0.9 and had a multiphasic waveform. Impression was of mild peripheral vascular disease  predominantly small vessel disease right ankle. 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 11/22/2014 -- he has been off cigarettes completely since Saturday and is on a nicotine patch and have commended him about this. 12/08/2014 -- he was seen in the vascular office at [place] and has been scheduled for a angiogram at the end of this month -- next Thursday. I have urged him to keep this appointment. 12/30/2014 -- since I saw him last on July 14 he had a procedure done by Dr. Lucky Cowboy on 12/15/2014. he had a aortogram and selective right lower extremity angiogram followed by a percutaneous angioplasty of the right popliteal artery, right anterior tibial artery, right peroneal artery, proximal and mid right posterior tibial artery. 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 Today he continues to have pain and he says that some of his toes are black. Readmission: 06/30/18 on evaluation today patient presents for reevaluation Concerning an altar on the invitation  site below his knee. He states this is on the end where there is a ring on the bottom of his shrinker that seems to rather push against this area. Nonetheless this has been open for several weeks and really didn't seem to be getting better which is why he wanted to come to see [place]. Fortunately this does not appear to be too significant and he is having issues with minimal drainage which is good news. There's no signs of infection which is also good news. Overall I'm not too worried about the fact that we should be able Moehle, [name] ([id number]) to get this area to heal quite readily my biggest issue at this point is simply the fact that the patient is continuing to have what sounds like friction to the stump region which is gonna prevent this from being able to fully close and stay closed. He does sometimes wear his prosthesis although he has not for some time simply due to the fact that again he's had this wound. He does have a history of congestive heart failure, peripheral vascular disease, coronary artery disease, and in stage renal disease for which she is on dialysis. 07/10/18 on evaluation today patient actually appears to be doing very well in regard to his ulcer on the distal aspect of the left amputation site this actually appears to be doing very well. Fortunately he's been tolerating the dressing changes without complication. There does not appear to be any signs of infection at this time. 07/24/18 on evaluation today patient appears to be doing rather well in regard to his distal imputation site also. With that being said there is a lot of necrotic tissue buildup on the end of the wound I think this is due to too much moisture at the site. Subsequently I did discuss sharp debridement today which hopefully will help this to heal more appropriately we also need to do some better moisture management in my opinion. Patient History Information obtained from Patient. Family  History Cancer - Paternal Grandparents, Diabetes - Father, Heart Disease - Father, Hypertension - Father, No family history of Hereditary Spherocytosis, Kidney Disease, Lung Disease, Seizures, Stroke, Thyroid Problems, Tuberculosis. Social History Current every day smoker, Marital Status - Widowed, Alcohol Use - Never, Drug Use - Prior History, Caffeine Use - Never. Medical History  Eyes Denies history of Cataracts, Glaucoma, Optic Neuritis Ear/Nose/Mouth/Throat Denies history of Chronic sinus problems/congestion, Middle ear problems Hematologic/Lymphatic Patient has history of Anemia Denies history of Hemophilia, Human Immunodeficiency Virus, Lymphedema, Sickle Cell Disease Respiratory Patient has history of Asthma, Chronic Obstructive Pulmonary Disease (COPD), Sleep Apnea Denies history of Aspiration, Pneumothorax, Tuberculosis Cardiovascular Patient has history of Congestive Heart Failure, Coronary Artery Disease, Hypertension, Myocardial Infarction, Peripheral Venous Disease Denies history of Angina, Arrhythmia, Deep Vein Thrombosis, Hypotension, Peripheral Arterial Disease, Phlebitis, Vasculitis Gastrointestinal Denies history of Cirrhosis , Colitis, Crohn s, Hepatitis A, Hepatitis B, Hepatitis C Endocrine Denies history of Type I Diabetes, Type II Diabetes Genitourinary Patient has history of End Stage Renal Disease - HD MWF Immunological Denies history of Lupus Erythematosus, Raynaud s, Scleroderma Integumentary (Skin) Denies history of History of Burn, History of pressure wounds Musculoskeletal Denies history of Gout, Rheumatoid Arthritis, Osteoarthritis, Osteomyelitis Neurologic Patient has history of Neuropathy Denies history of Dementia, Quadriplegia, Paraplegia, Seizure Disorder Oncologic Denies history of Received Chemotherapy, Received Radiation Psychiatric [name], [name] ([id number]) Denies history of Anorexia/bulimia, Confinement  Anxiety Hospitalization/Surgery History - 06/27/2014, [place], L BKA. Medical And Surgical History Notes Musculoskeletal L BKA Review of Systems (ROS) Constitutional Symptoms (General Health) Denies complaints or symptoms of Fever, Chills. Respiratory The patient has no complaints or symptoms. Cardiovascular The patient has no complaints or symptoms. Psychiatric The patient has no complaints or symptoms. Objective Constitutional Well-nourished and well-hydrated in no acute distress. Vitals Time Taken: 2:05 PM, Height: 74 in, Weight: 210 lbs, BMI: 27, Temperature: 98.4 F, Pulse: 73 bpm, Respiratory Rate: 16 breaths/min, Blood Pressure: 128/66 mmHg. Respiratory normal breathing without difficulty. clear to auscultation bilaterally. Cardiovascular regular rate and rhythm with normal S1, S2. Psychiatric this patient is able to make decisions and demonstrates good insight into disease process. Alert and Oriented x 3. pleasant and cooperative. General Notes: Patient's wound bed did require sharp debridement today I was able to perform this without complication post debridement wound bed appears to be doing thinking we better which is great news. Overall I'm very pleased with how things appear he did have some discomfort but was able to get the agreement without complication. Integumentary (Hair, Skin) Wound #6 status is Open. Original cause of wound was Pressure Injury. The wound is located on the Left,Midline Amputation Site - Below Knee. The wound measures 3.2cm length x 4cm width x 0.1cm depth; 10.053cm^2 area and 1.005cm^3 volume. There is Fat Layer (Subcutaneous Tissue) Exposed exposed. There is no tunneling or undermining noted. There is a medium amount of serous drainage noted. The wound margin is flat and intact. There is no granulation within the wound bed. There is a large (67-100%) amount of necrotic tissue within the wound bed including Adherent Slough. The periwound  skin appearance had no abnormalities noted for moisture. The periwound skin appearance did not exhibit: Callus, Crepitus, Excoriation, Induration, Rash, Scarring, Atrophie Blanche, Cyanosis, Ecchymosis, Hemosiderin Staining, Mottled, Pallor, [name], [name] ([id number]) Rubor, Erythema. Periwound temperature was noted as No Abnormality. The periwound has tenderness on palpation. Assessment Active Problems ICD-10 Non-pressure chronic ulcer of skin of other sites with fat layer exposed Acquired absence of left leg below knee Chronic combined systolic (congestive) and diastolic (congestive) heart failure Other specified peripheral vascular diseases Atherosclerotic heart disease of native coronary artery with unspecified angina pectoris Procedures Wound #6 Pre-procedure diagnosis of Wound #6 is a Pressure Ulcer located on the Left,Midline Amputation Site - Below Knee . There was a Excisional Skin/Subcutaneous Tissue Debridement with a  total area of 12.8 sq cm performed by STONE III, HOYT E., PA-C. With the following instrument(s): Curette to remove Non-Viable tissue/material. Material removed includes Subcutaneous Tissue and Slough and after achieving pain control using Lidocaine. No specimens were taken. A time out was conducted at 14:36, prior to the start of the procedure. A Minimum amount of bleeding was controlled with Pressure. The procedure was tolerated well with a pain level of 0 throughout and a pain level of 0 following the procedure. Post Debridement Measurements: 3cm length x 3.5cm width x 0.1cm depth; 0.825cm^3 volume. Post debridement Stage noted as Category/Stage III. Character of Wound/Ulcer Post Debridement is improved. Post procedure Diagnosis Wound #6: Same as Pre-Procedure Plan Wound Cleansing: Wound #6 Left,Midline Amputation Site - Below Knee: Clean wound with Normal Saline. May Shower, gently pat wound dry prior to applying new dressing. Anesthetic (add to  Medication List): Wound #6 Left,Midline Amputation Site - Below Knee: Topical Lidocaine 4% cream applied to wound bed prior to debridement (In Clinic Only). Primary Wound Dressing: Wound #6 Left,Midline Amputation Site - Below Knee: Silver Alginate Secondary Dressing: Wound #6 Left,Midline Amputation Site - Below Knee: Boardered Foam Dressing Dressing Change Frequency: Wound #6 Left,Midline Amputation Site - Below Knee: Change Dressing Monday, Wednesday, Friday - We'll see patient on Friday SARKIS, RHINES ([id number]) Follow-up Appointments: Wound #6 Left,Midline Amputation Site - Below Knee: Return Appointment in 1 week. Off-Loading: Wound #6 Left,Midline Amputation Site - Below Knee: Other: - continue not wearing your prosthesis Home Health: Wound #6 Left,Midline Amputation Site - Below Knee: [place] Nurse may visit PRN to address patient s wound care needs. FACE TO FACE ENCOUNTER: MEDICARE and MEDICAID PATIENTS: I certify that this patient is under my care and that I had a face-to-face encounter that meets the physician face-to-face encounter requirements with this patient on this date. The encounter with the patient was in whole or in part for the following MEDICAL CONDITION: (primary reason for [place]) MEDICAL NECESSITY: I certify, that based on my findings, NURSING services are a medically necessary home health service. HOME BOUND STATUS: I certify that my clinical findings support that this patient is homebound (i.e., Due to illness or injury, pt requires aid of supportive devices such as crutches, cane, wheelchairs, walkers, the use of special transportation or the assistance of another person to leave their place of residence. There is a normal inability to leave the home and doing so requires considerable and taxing effort. Other absences are for medical reasons / religious services and are infrequent or of short duration when for  other reasons). If current dressing causes regression in wound condition, may D/C ordered dressing product/s and apply Normal Saline Moist Dressing daily until next [place] / Other MD appointment. [place] of regression in wound condition at [phone]. Please direct any NON-WOUND related issues/requests for orders to patient's Primary Care Physician I'm in a recommend that we continue with the above wound to measures for the next week. Patient is in agreement the plan. We will subsequently see were things stand at follow-up. If he has any other concerns or questions meantime he will contact the office and let me know. Please see above for specific wound care orders. We will see patient for re-evaluation in 1 week(s) here in the clinic. If anything worsens or changes patient will contact our office for additional recommendations. Electronic Signature(s) Signed: 07/24/2018 5:19:05 PM By: Worthy Keeler PA-C Entered By: Melburn Hake,  Hoyt on 07/24/2018 14:50:12 Duerst, PRINTICE HELLMER ([id number]) -------------------------------------------------------------------------------- ROS/PFSH Details Patient Name: Zabinski, QUANELL LOUGHNEY. Date of Service: 07/24/2018 1:45 PM Medical Record Number: [id number] Patient Account Number: [id number] Date of Birth/Sex: 11-Dec-1955 (62 y.o. M) Treating RN: Harold Barban Primary Care Provider: Tomasa Hose Other Clinician: Referring Provider: Tomasa Hose Treating Provider/Extender: STONE III, HOYT Weeks in Treatment: 3 Information Obtained From Patient Wound History Do you currently have one or more open woundso Yes How many open wounds do you currently haveo 1 Approximately how long have you had your woundso 3 weeks Has your wound(s) ever healed and then re-openedo No Have you had any lab work done in the past montho Yes Have you tested positive for an antibiotic resistant organism (MRSA, VRE)o Yes Have you tested positive for  osteomyelitis (bone infection)o No Have you had any tests for circulation on your legso Yes Constitutional Symptoms (General Health) Complaints and Symptoms: Negative for: Fever; Chills Eyes Medical History: Negative for: Cataracts; Glaucoma; Optic Neuritis Ear/Nose/Mouth/Throat Medical History: Negative for: Chronic sinus problems/congestion; Middle ear problems Hematologic/Lymphatic Medical History: Positive for: Anemia Negative for: Hemophilia; Human Immunodeficiency Virus; Lymphedema; Sickle Cell Disease Respiratory Complaints and Symptoms: No Complaints or Symptoms Medical History: Positive for: Asthma; Chronic Obstructive Pulmonary Disease (COPD); Sleep Apnea Negative for: Aspiration; Pneumothorax; Tuberculosis Cardiovascular Complaints and Symptoms: No Complaints or Symptoms Medical History: Positive for: Congestive Heart Failure; Coronary Artery Disease; Hypertension; Myocardial Infarction; Peripheral Venous Depaz, Mihail N. ([id number]) Disease Negative for: Angina; Arrhythmia; Deep Vein Thrombosis; Hypotension; Peripheral Arterial Disease; Phlebitis; Vasculitis Gastrointestinal Medical History: Negative for: Cirrhosis ; Colitis; Crohnos; Hepatitis A; Hepatitis B; Hepatitis C Endocrine Medical History: Negative for: Type I Diabetes; Type II Diabetes Genitourinary Medical History: Positive for: End Stage Renal Disease - HD MWF Immunological Medical History: Negative for: Lupus Erythematosus; Raynaudos; Scleroderma Integumentary (Skin) Medical History: Negative for: History of Burn; History of pressure wounds Musculoskeletal Medical History: Negative for: Gout; Rheumatoid Arthritis; Osteoarthritis; Osteomyelitis Past Medical History Notes: L BKA Neurologic Medical History: Positive for: Neuropathy Negative for: Dementia; Quadriplegia; Paraplegia; Seizure Disorder Oncologic Medical History: Negative for: Received Chemotherapy; Received  Radiation Psychiatric Complaints and Symptoms: No Complaints or Symptoms Medical History: Negative for: Anorexia/bulimia; Confinement Anxiety Immunizations Pneumococcal Vaccine: Received Pneumococcal Vaccination: Yes Implantable Devices Egner, Ordean N. ([id number]) No devices added Hospitalization / Surgery History Name of Hospital Purpose of Hospitalization/Surgery Date [facility] L BKA 06/27/2014 Family and Social History Cancer: Yes - Paternal Grandparents; Diabetes: Yes - Father; Heart Disease: Yes - Father; Hereditary Spherocytosis: No; Hypertension: Yes - Father; Kidney Disease: No; Lung Disease: No; Seizures: No; Stroke: No; Thyroid Problems: No; Tuberculosis: No; Current every day smoker; Marital Status - Widowed; Alcohol Use: Never; Drug Use: Prior History; Caffeine Use: Never; Financial Concerns: No; Food, Clothing or Shelter Needs: No; Support System Lacking: No; Transportation Concerns: No; Advanced Directives: No; Patient does not want information on Advanced Directives Physician Affirmation I have reviewed and agree with the above information. Electronic Signature(s) Signed: 07/24/2018 5:19:05 PM By: Worthy Keeler PA-C Signed: 07/24/2018 5:19:20 PM By: Harold Barban Entered By: Worthy Keeler on 07/24/2018 14:49:44 Moss, [name] ([id number]) -------------------------------------------------------------------------------- SuperBill Details Patient Name: Mccue, Gerome N. Date of Service: 07/24/2018 Medical Record Number: [id number] Patient Account Number: [id number] Date of Birth/Sex: 24-Nov-1955 (63 y.o. 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.512 Acquired absence of left leg below knee I50.42  Chronic combined systolic (congestive) and diastolic (congestive) heart failure I73.89 Other specified peripheral vascular diseases I25.119 Atherosclerotic heart disease of native coronary artery with unspecified angina pectoris Facility Procedures CPT4 Code: 53614431 Description: 54008 - DEB SUBQ TISSUE 20 SQ CM/< ICD-10 Diagnosis Description L98.492 Non-pressure chronic ulcer of skin of other sites with fat la Modifier: yer exposed Quantity: 1 Physician Procedures CPT4 Code: 6761950 Description: 11042 - WC PHYS SUBQ TISS 20 SQ CM ICD-10 Diagnosis Description L98.492 Non-pressure chronic ulcer of skin of other sites with fat la Modifier: yer exposed Quantity: 1 Electronic Signature(s) Signed: 07/24/2018 5:19:05 PM By: Worthy Keeler PA-C Entered By: Worthy Keeler on 07/24/2018 14:50:28

## 2018-07-26 NOTE — Progress Notes (Signed)
FENIX, RORKE ([id number]) Visit Report for 07/24/2018 Arrival Information Details Patient Name: [name], [name]. Date of Service: 07/24/2018 1:45 PM Medical Record Number: [id number] Patient Account Number: [id number] Date of Birth/Sex: Nov 17, 1955 (62 y.o. M) Treating RN: Secundino Ginger Primary Care Madicyn Mesina: Tomasa Hose Other Clinician: Referring Bowe Sidor: Tomasa Hose Treating Rathana Viveros/Extender: Melburn Hake, HOYT Weeks in Treatment: 3 Visit Information History Since Last Visit Added or deleted any medications: No Patient Arrived: Wheel Chair Any new allergies or adverse reactions: No Arrival Time: 14:02 Had a fall or experienced change in No Accompanied By: self activities of daily living that may affect Transfer Assistance: None risk of falls: Patient Identification Verified: Yes Signs or symptoms of abuse/neglect since last visito No Secondary Verification Process Yes Hospitalized since last visit: No Completed: Implantable device outside of the clinic excluding No Patient Has Alerts: Yes cellular tissue based products placed in the center Patient Alerts: Patient on Blood since last visit: Thinner Has Dressing in Place as Prescribed: Yes NOT DIABETIC Pain Present Now: No Eliquis Electronic Signature(s) Signed: 07/24/2018 2:18:07 PM By: Secundino Ginger Entered By: Secundino Ginger on 07/24/2018 14:05:14 Waterson, [name] ([id number]) -------------------------------------------------------------------------------- Encounter Discharge Information Details Patient Name: [name], [name] Date of Service: 07/24/2018 1:45 PM Medical Record Number: [id number] Patient Account Number: [id number] Date of Birth/Sex: 1956/04/30 (62 y.o. M) Treating RN: Army Melia Primary Care Raunel Dimartino: Tomasa Hose Other Clinician: Referring Sage Kopera: Tomasa Hose Treating Skyeler Smola/Extender: Melburn Hake, HOYT Weeks in Treatment: 3 Encounter Discharge Information Items Post Procedure Vitals Discharge Condition:  Stable Temperature (F): 98.3 Ambulatory Status: Wheelchair Pulse (bpm): 73 Discharge Destination: Home Respiratory Rate (breaths/min): 16 Transportation: Private Auto Blood Pressure (mmHg): 123/79 Accompanied By: self Schedule Follow-up Appointment: Yes Clinical Summary of Care: Electronic Signature(s) Signed: 07/24/2018 4:47:10 PM By: Army Melia Entered By: Army Melia on 07/24/2018 14:49:39 Strome, [name] ([id number]) -------------------------------------------------------------------------------- Lower Extremity Assessment Details Patient Name: [name], [name] Date of Service: 07/24/2018 1:45 PM Medical Record Number: [id number] Patient Account Number: [id number] Date of Birth/Sex: 15-Jul-1955 (62 y.o. M) Treating RN: Secundino Ginger Primary Care Kamerin Grumbine: Tomasa Hose Other Clinician: Referring Esthefany Herrig: Tomasa Hose Treating Shakaya Bhullar/Extender: Melburn Hake, HOYT Weeks in Treatment: 3 Electronic Signature(s) Signed: 07/24/2018 2:18:07 PM By: Secundino Ginger Entered By: Secundino Ginger on 07/24/2018 14:11:22 Babel, [name] ([id number]) -------------------------------------------------------------------------------- Multi Wound Chart Details Patient Name: [name], [name] Date of Service: 07/24/2018 1:45 PM Medical Record Number: [id number] Patient Account Number: [id number] Date of Birth/Sex: [birth year] (62 y.o. M) Treating RN: Harold Barban Primary Care Tranquilino Fischler: Tomasa Hose Other Clinician: Referring Deseray Daponte: Tomasa Hose Treating Lashunta Frieden/Extender: STONE III, HOYT Weeks in Treatment: 3 Vital Signs Height(in): 74 Pulse(bpm): 73 Weight(lbs): 210 Blood Pressure(mmHg): 128/66 Body Mass Index(BMI): 27 Temperature(F): 98.4 Respiratory Rate 16 (breaths/min): Photos: [N/A:N/A] Wound Location: Left Amputation Site - Below N/A N/A Knee - Midline Wounding Event: Pressure Injury N/A N/A Primary Etiology: Pressure Ulcer N/A N/A Comorbid History: Anemia, Asthma, Chronic N/A  N/A Obstructive Pulmonary Disease (COPD), Sleep Apnea, Congestive Heart Failure, Coronary Artery Disease, Hypertension, Myocardial Infarction, Peripheral Venous Disease, End Stage Renal Disease, Neuropathy Date Acquired: 06/09/2018 N/A N/A Weeks of Treatment: 3 N/A N/A Wound Status: Open N/A N/A Measurements L x W x D 3.2x4x0.1 N/A N/A (cm) Area (cm) : 10.053 N/A N/A Volume (cm) : 1.005 N/A N/A % Reduction in Area: -8.10% N/A N/A % Reduction in Volume: -8.10% N/A N/A Classification: Category/Stage III N/A N/A Exudate Amount: Medium N/A N/A Exudate Type: Serous N/A N/A Exudate Color: amber N/A N/A Wound Margin: Flat  and Intact N/A N/A Granulation Amount: None Present (0%) N/A N/A Alonzo, Radwan N. ([id number]) Necrotic Amount: Large (67-100%) N/A N/A Exposed Structures: Fat Layer (Subcutaneous N/A N/A Tissue) Exposed: Yes Fascia: No Tendon: No Muscle: No Joint: No Bone: No Epithelialization: Small (1-33%) N/A N/A Periwound Skin Texture: Excoriation: No N/A N/A Induration: No Callus: No Crepitus: No Rash: No Scarring: No Periwound Skin Moisture: Maceration: No N/A N/A Dry/Scaly: No Periwound Skin Color: Atrophie Blanche: No N/A N/A Cyanosis: No Ecchymosis: No Erythema: No Hemosiderin Staining: No Mottled: No Pallor: No Rubor: No Temperature: No Abnormality N/A N/A Tenderness on Palpation: Yes N/A N/A Wound Preparation: Ulcer Cleansing: N/A N/A Rinsed/Irrigated with Saline Topical Anesthetic Applied: Other: lidocaine 4% Treatment Notes Electronic Signature(s) Signed: 07/24/2018 5:19:20 PM By: Harold Barban Entered By: Harold Barban on 07/24/2018 14:34:36 Deschepper, [name] ([id number]) -------------------------------------------------------------------------------- [place] Details Patient Name: [name] N. Date of Service: 07/24/2018 1:45 PM Medical Record Number: [id number] Patient Account Number: [id number] Date of  Birth/Sex: 1956/01/21 (62 y.o. M) Treating RN: Harold Barban Primary Care Jaymarie Yeakel: Tomasa Hose Other Clinician: Referring Kewanda Poland: Tomasa Hose Treating July Linam/Extender: Melburn Hake, HOYT Weeks in Treatment: 3 Active Inactive Abuse / Safety / Falls / Self Care Management Nursing Diagnoses: Impaired physical mobility Goals: Patient will not develop complications from immobility Date Initiated: 06/30/2018 Target Resolution Date: 09/25/2018 Goal Status: Active Interventions: Assess fall risk on admission and as needed Notes: Nutrition Nursing Diagnoses: Potential for alteratiion in Nutrition/Potential for imbalanced nutrition Goals: Patient/caregiver agrees to and verbalizes understanding of need to use nutritional supplements and/or vitamins as prescribed Date Initiated: 06/30/2018 Target Resolution Date: 09/25/2018 Goal Status: Active Interventions: Assess patient nutrition upon admission and as needed per policy Notes: Orientation to the Wound Care Program Nursing Diagnoses: Knowledge deficit related to the wound healing center program Goals: Patient/caregiver will verbalize understanding of the [facility] Date Initiated: 06/30/2018 Target Resolution Date: 09/26/2018 Goal Status: Active Interventions: Provide education on orientation to the wound center Vertz, Jeremias N. ([id number]) Notes: Wound/Skin Impairment Nursing Diagnoses: Impaired tissue integrity Goals: Ulcer/skin breakdown will heal within 14 weeks Date Initiated: 06/30/2018 Target Resolution Date: 09/25/2018 Goal Status: Active Interventions: Assess patient/caregiver ability to obtain necessary supplies Assess patient/caregiver ability to perform ulcer/skin care regimen upon admission and as needed Assess ulceration(s) every visit Notes: Electronic Signature(s) Signed: 07/24/2018 5:19:20 PM By: Harold Barban Entered By: Harold Barban on 07/24/2018 14:34:19 Mckenna, [name]  ([id number]) -------------------------------------------------------------------------------- Pain Assessment Details Patient Name: Begeman, [name] Date of Service: 07/24/2018 1:45 PM Medical Record Number: [id number] Patient Account Number: [id number] Date of Birth/Sex: 1955/08/06 (62 y.o. M) Treating RN: Secundino Ginger Primary Care Iola Turri: Tomasa Hose Other Clinician: Referring Shin Lamour: Tomasa Hose Treating Suha Schoenbeck/Extender: STONE III, HOYT Weeks in Treatment: 3 Active Problems Location of Pain Severity and Description of Pain Patient Has Paino No Site Locations Pain Management and Medication Current Pain Management: Notes pt c/o intermittent burning to wound site. enc. to see primary PRN. Electronic Signature(s) Signed: 07/24/2018 2:18:07 PM By: Secundino Ginger Entered By: Secundino Ginger on 07/24/2018 14:05:53 Tay, [name] ([id number]) -------------------------------------------------------------------------------- Patient/Caregiver Education Details Patient Name: Laconte, [name] Date of Service: 07/24/2018 1:45 PM Medical Record Number: [id number] Patient Account Number: [id number] Date of Birth/Gender: 1955/12/21 (63 y.o. M) Treating RN: Harold Barban Primary Care Physician: Tomasa Hose Other Clinician: Referring Physician: Tomasa Hose Treating Physician/Extender: Sharalyn Ink in Treatment: 3 Education Assessment Education Provided To: Patient Education Topics Provided Wound/Skin Impairment: Handouts: Caring for Your Ulcer Methods: Demonstration, Explain/Verbal  Responses: State content correctly Electronic Signature(s) Signed: 07/24/2018 5:19:20 PM By: Harold Barban Entered By: Harold Barban on 07/24/2018 14:34:57 Lanting, [name] ([id number]) -------------------------------------------------------------------------------- Wound Assessment Details Patient Name: Segers, Harvir N. Date of Service: 07/24/2018 1:45 PM Medical Record Number: [id number] Patient  Account Number: [id number] Date of Birth/Sex: [birth year] (62 y.o. M) Treating RN: Secundino Ginger Primary Care Aeriana Speece: Tomasa Hose Other Clinician: Referring Shearon Clonch: Tomasa Hose Treating Evertt Chouinard/Extender: STONE III, HOYT Weeks in Treatment: 3 Wound Status Wound Number: 6 Primary Pressure Ulcer Etiology: Wound Location: Left Amputation Site - Below Knee - Midline Wound Open Status: Wounding Event: Pressure Injury Comorbid Anemia, Asthma, Chronic Obstructive Pulmonary Date Acquired: 06/09/2018 History: Disease (COPD), Sleep Apnea, Congestive Heart Weeks Of Treatment: 3 Failure, Coronary Artery Disease, Hypertension, Clustered Wound: No Myocardial Infarction, Peripheral Venous Disease, End Stage Renal Disease, Neuropathy Photos Photo Uploaded By: Secundino Ginger on 07/24/2018 14:19:37 Wound Measurements Length: (cm) 3.2 Width: (cm) 4 Depth: (cm) 0.1 Area: (cm) 10.053 Volume: (cm) 1.005 % Reduction in Area: -8.1% % Reduction in Volume: -8.1% Epithelialization: Small (1-33%) Tunneling: No Undermining: No Wound Description Classification: Category/Stage III Foul O Wound Margin: Flat and Intact Slough Exudate Amount: Medium Exudate Type: Serous Exudate Color: amber dor After Cleansing: No /Fibrino No Wound Bed Granulation Amount: None Present (0%) Exposed Structure Necrotic Amount: Large (67-100%) Fascia Exposed: No Necrotic Quality: Adherent Slough Fat Layer (Subcutaneous Tissue) Exposed: Yes Tendon Exposed: No Muscle Exposed: No Joint Exposed: No Bone Exposed: No Rimel, Flay N. ([id number]) Periwound Skin Texture Texture Color No Abnormalities Noted: No No Abnormalities Noted: No Callus: No Atrophie Blanche: No Crepitus: No Cyanosis: No Excoriation: No Ecchymosis: No Induration: No Erythema: No Rash: No Hemosiderin Staining: No Scarring: No Mottled: No Pallor: No Moisture Rubor: No No Abnormalities Noted: Yes Temperature / Pain Temperature: No  Abnormality Tenderness on Palpation: Yes Wound Preparation Ulcer Cleansing: Rinsed/Irrigated with Saline Topical Anesthetic Applied: Other: lidocaine 4%, Treatment Notes Wound #6 (Left, Midline Amputation Site - Below Knee) Notes silver cell, bordered foam dressing Electronic Signature(s) Signed: 07/24/2018 2:18:07 PM By: Secundino Ginger Entered By: Secundino Ginger on 07/24/2018 14:14:26 Nemitz, [name] ([id number]) -------------------------------------------------------------------------------- Vitals Details Patient Name: Rossitto, Adham N. Date of Service: 07/24/2018 1:45 PM Medical Record Number: [id number] Patient Account Number: [id number] Date of Birth/Sex: August 13, 1955 (62 y.o. M) Treating RN: Secundino Ginger Primary Care Leaman Abe: Tomasa Hose Other Clinician: Referring Khaleah Duer: Tomasa Hose Treating Hyun Marsalis/Extender: STONE III, HOYT Weeks in Treatment: 3 Vital Signs Time Taken: 14:05 Temperature (F): 98.4 Height (in): 74 Pulse (bpm): 73 Weight (lbs): 210 Respiratory Rate (breaths/min): 16 Body Mass Index (BMI): 27 Blood Pressure (mmHg): 128/66 Reference Range: 80 - 120 mg / dl Electronic Signature(s) Signed: 07/24/2018 2:18:07 PM By: Secundino Ginger Entered BySecundino Ginger on 07/24/2018 14:06:17

## 2018-07-27 IMAGING — DX DG CHEST 1V PORT
1 series · 1 of 1 positions shown · non-contrast
Comparison: June 22, 2017

CLINICAL DATA: Chest pain and shortness of breath.  Renal failure.

EXAM:
PORTABLE CHEST 1 VIEW

[chest ap]
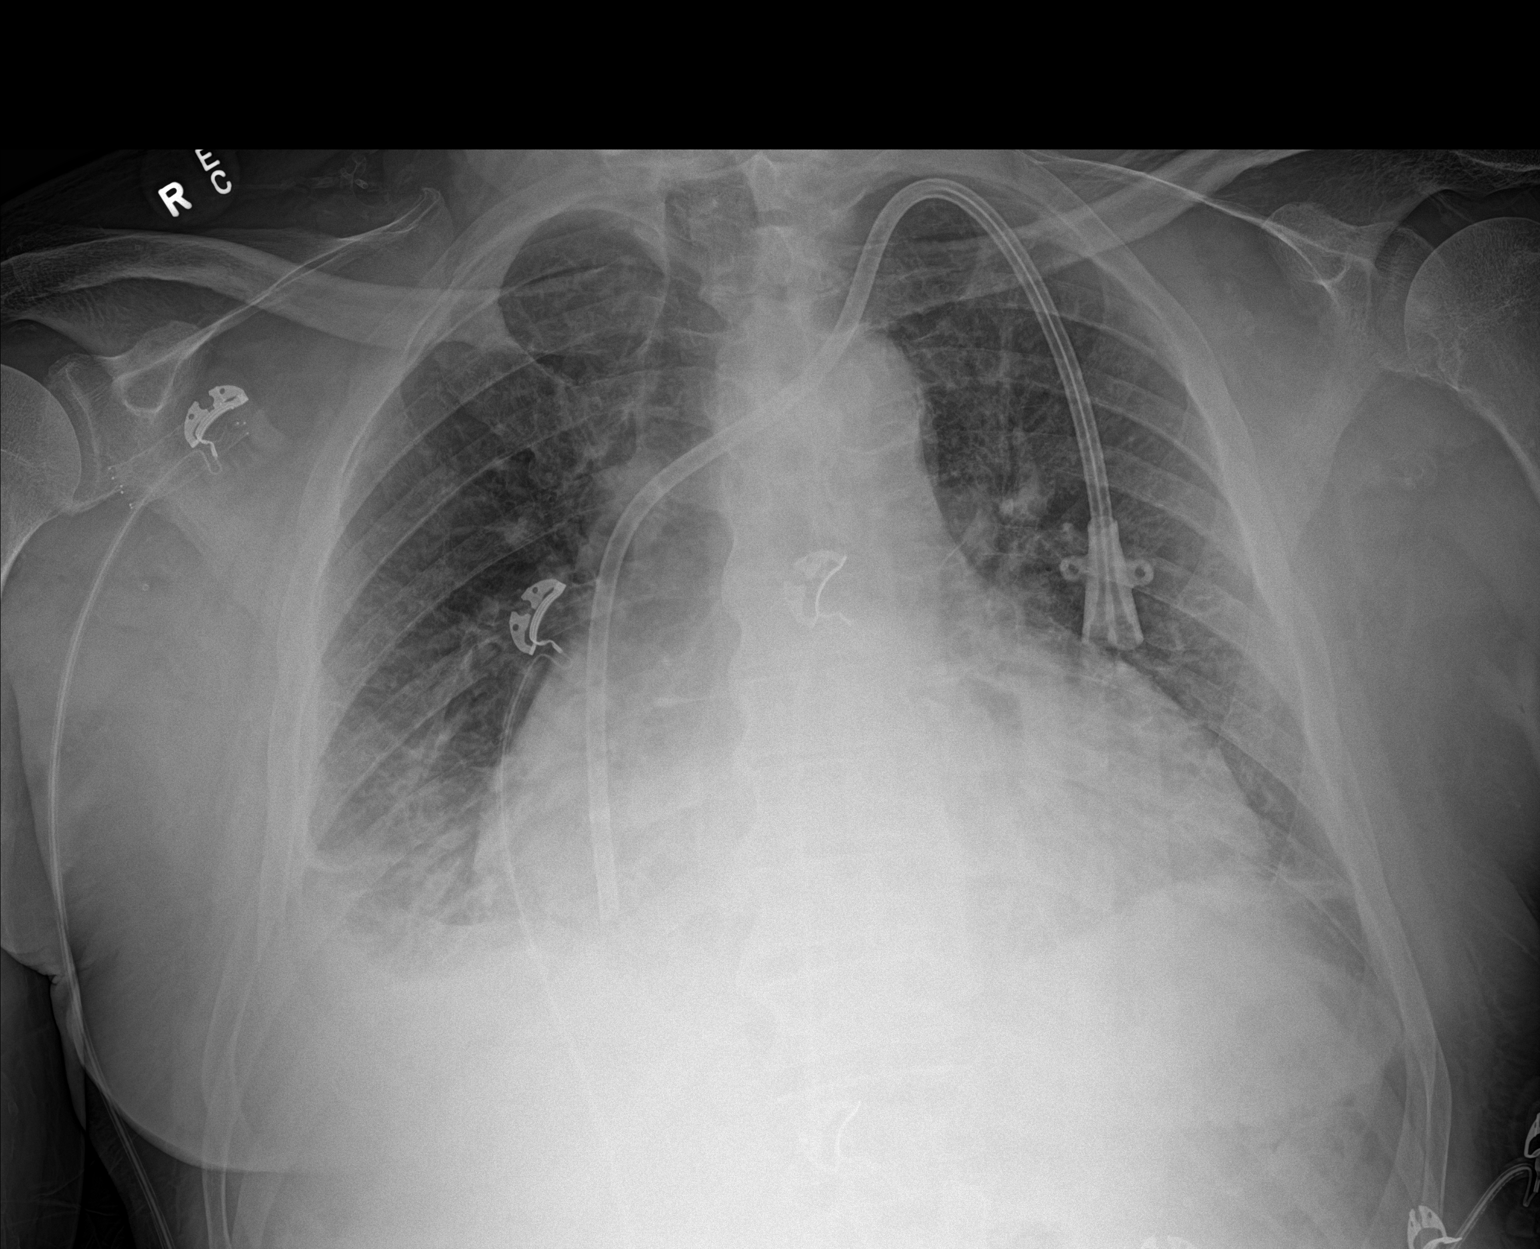

[1 of 1 positions shown; findings below may reference images not displayed]

FINDINGS: Central catheter tip is in the right atrium. No pneumothorax. There
is a small right pleural effusion with bibasilar atelectatic change.
The lungs elsewhere are clear. Heart is enlarged with pulmonary
vascularity within normal limits. There is aortic atherosclerosis.
No adenopathy. No bone lesions.
IMPRESSION: Small right pleural effusion with bibasilar atelectasis. Stable
cardiomegaly. There is aortic atherosclerosis.

Aortic Atherosclerosis ([id number]).

## 2018-07-27 IMAGING — CT CT ANGIO CHEST
2 of 6 series · 18 of 46 positions shown · IV contrast (APPLIED)
Comparison: Chest x-ray dated 07/21/2017 and CT scan of the chest
dated 08/03/2016

CLINICAL DATA: Shortness of breath.  Tenderness around PermCath.

EXAM:
CT ANGIOGRAPHY CHEST WITH CONTRAST
TECHNIQUE: Multidetector CT imaging of the chest was performed using the
standard protocol during bolus administration of intravenous
contrast. Multiplanar CT image reconstructions and MIPs were
obtained to evaluate the vascular anatomy.
CONTRAST:  75mL UYEUBE-ISF IOPAMIDOL (UYEUBE-ISF) INJECTION 76%

[Series 5: thins · axial · 0.71mm/px · z∈[-699,-436]mm · 15 of 289 slices shown]
[im 13/289  lung]
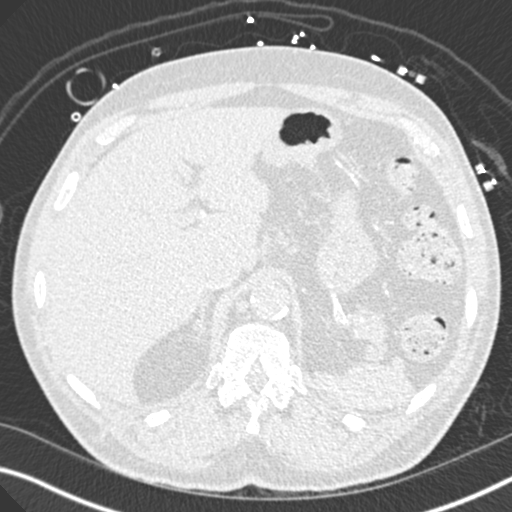
[im 38/289  soft-tissue]
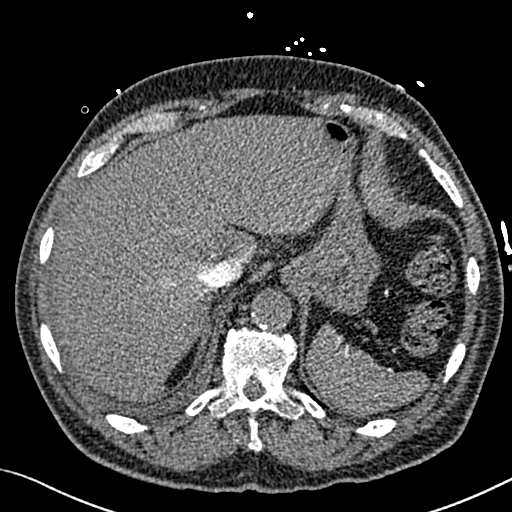
[im 51/289  lung]
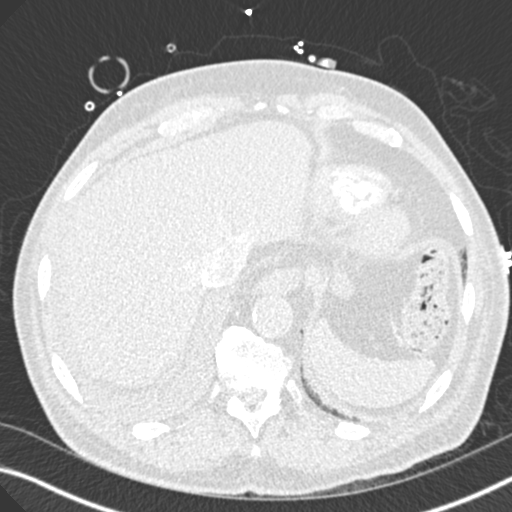
[im 76/289  soft-tissue]
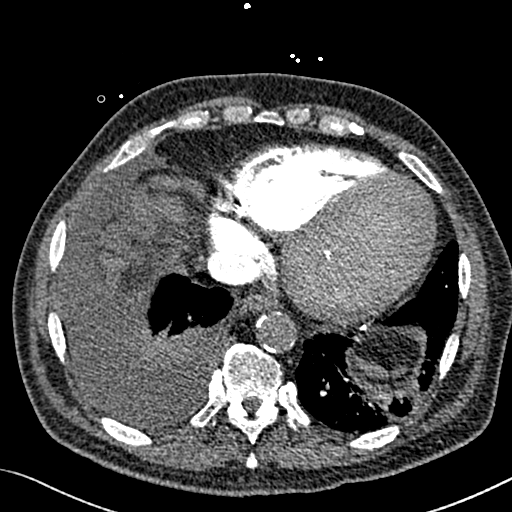
[im 88/289  lung]
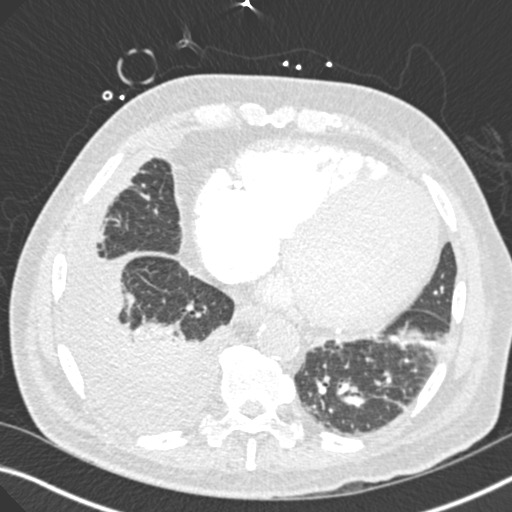
[im 113/289  soft-tissue]
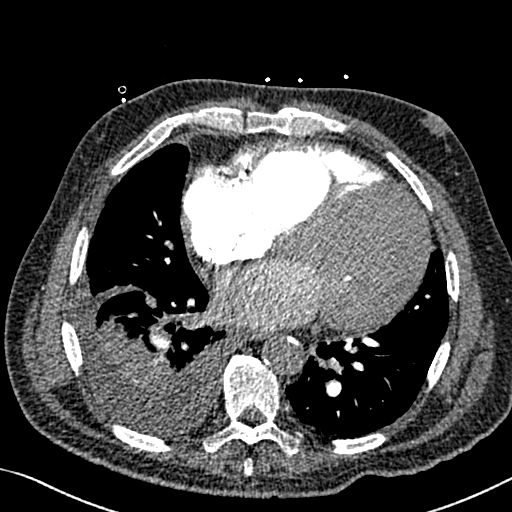
[im 126/289  lung]
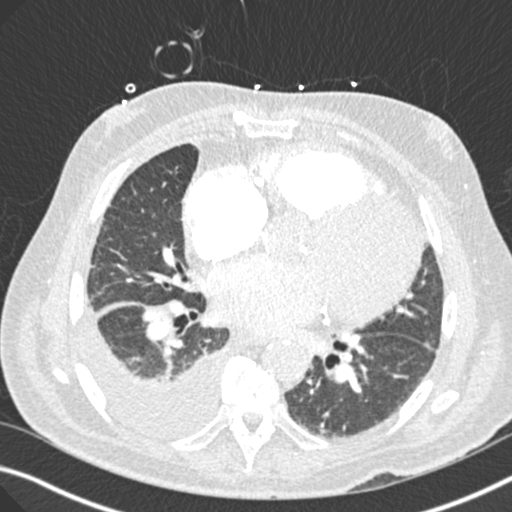
[im 151/289  soft-tissue]
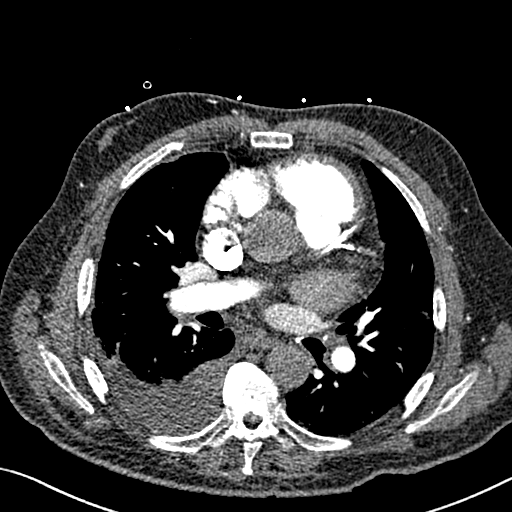
[im 163/289  lung]
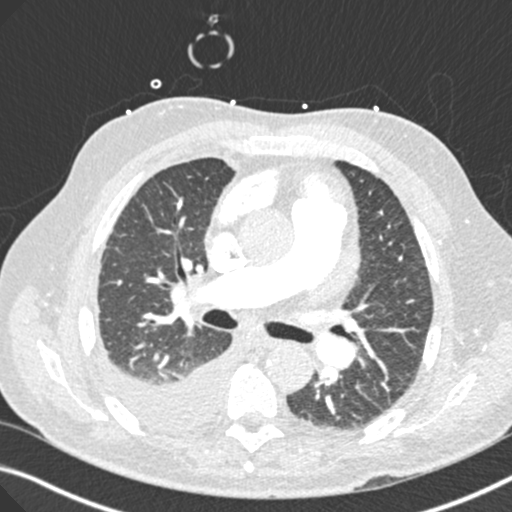
[im 176/289  soft-tissue]
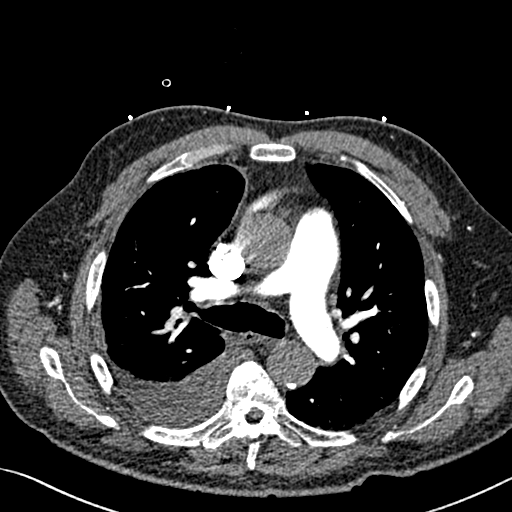
[im 201/289  lung]
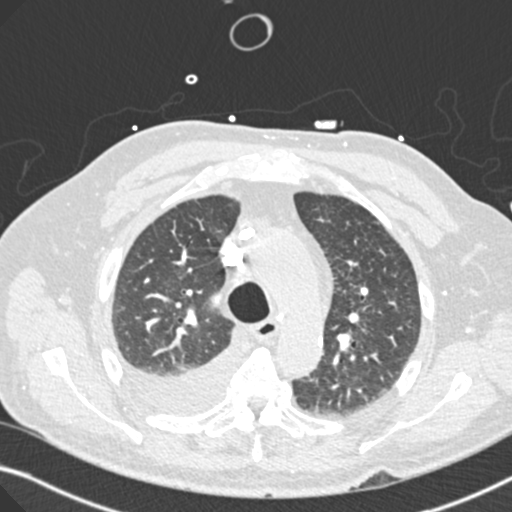
[im 213/289  soft-tissue]
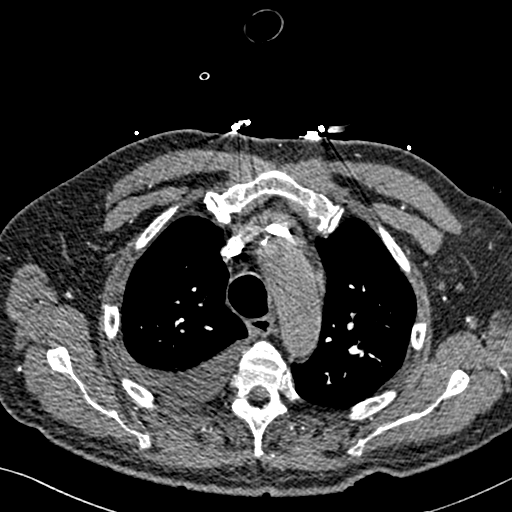
[im 238/289  lung]
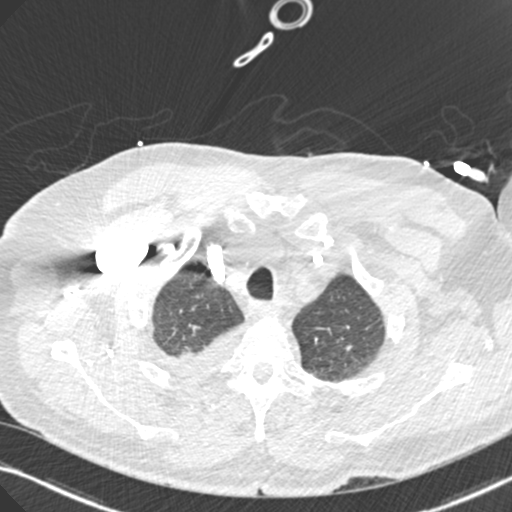
[im 251/289  soft-tissue]
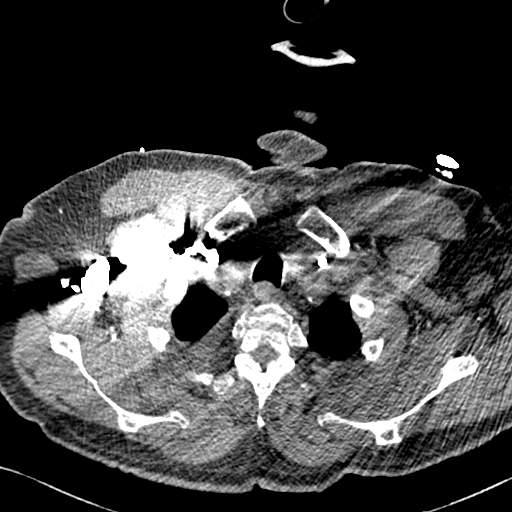
[im 276/289  lung]
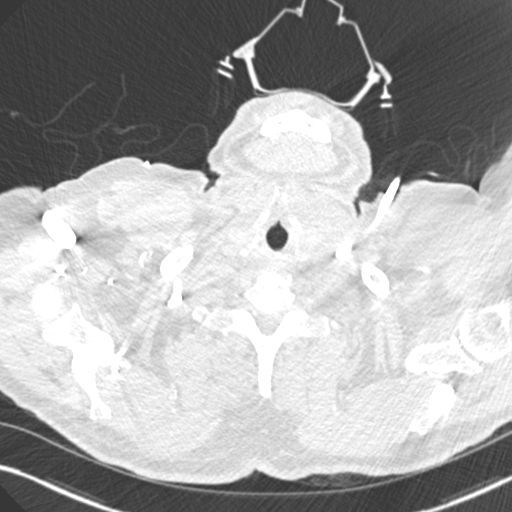

[Series 7: coronal mpr · coronal · 0.59mm/px · 3 of 107 slices shown]
[im 27/107  soft-tissue]
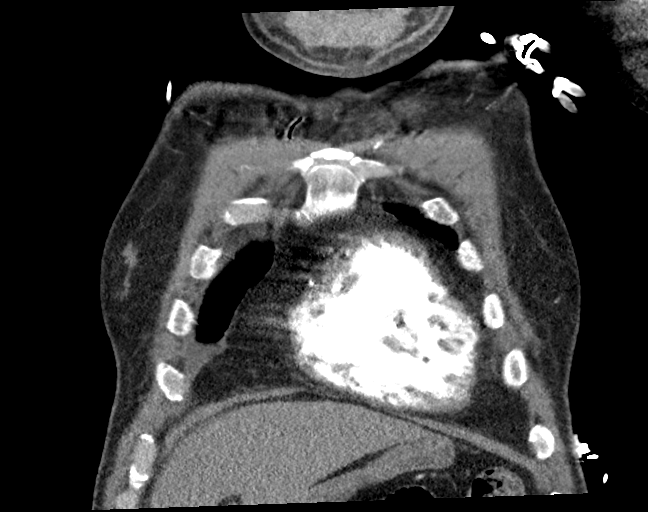
[im 54/107  soft-tissue]
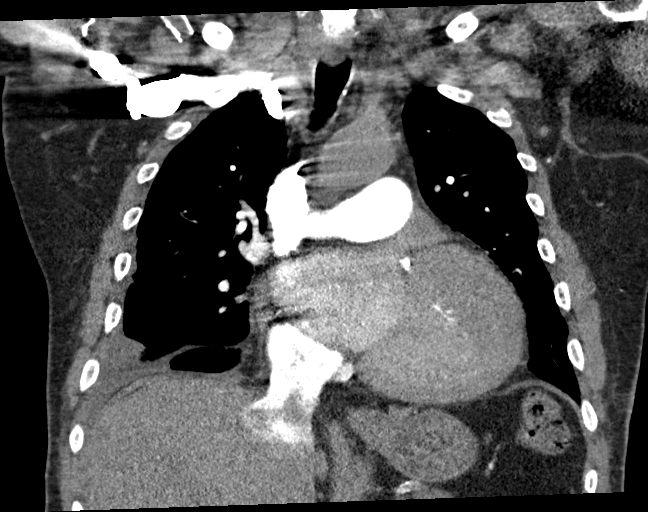
[im 80/107  soft-tissue]
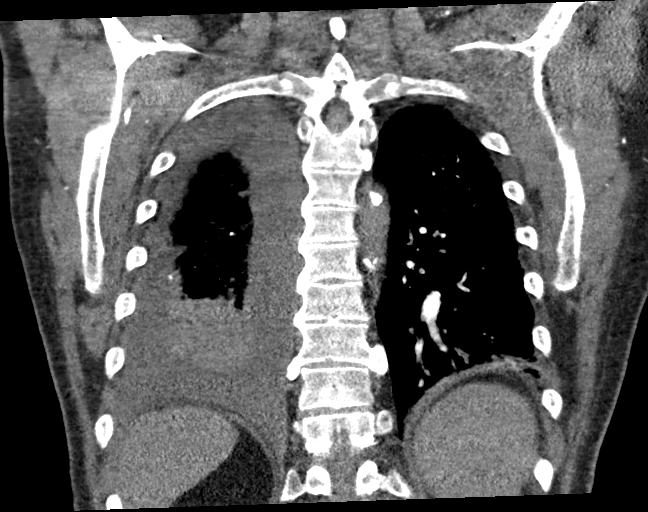

[18 of 46 positions shown; findings below may reference images not displayed]

FINDINGS: Cardiovascular: There is a single segmental embolus in the anterior
basal region of the right lower lobe as well as small subsegmental
emboli in the right lower lobe. There is an adjacent peripheral area
of lung consolidation in the right lower lobe with some
calcifications. There is also adjacent moderate right pleural
effusion.

Chronic cardiomegaly, slightly increased. Extensive coronary artery
calcification and aortic atherosclerosis. PermCath in good position.

Mediastinum/Nodes: No enlarged mediastinal, hilar, or axillary lymph
nodes. Thyroid gland, trachea, and esophagus demonstrate no
significant findings.

Lungs/Pleura: Moderate right pleural effusion. Focal area of
consolidation in the right lower lobe with some parenchymal
calcifications indicating that this is most likely a chronic
process. This could represent acute on chronic pulmonary infarct.

Slight atelectasis at the left lung base.

Upper Abdomen: No acute abnormality.

Musculoskeletal: No acute abnormality. Osteophytes fuse much of the
mid and lower thoracic spine. No evidence of an abscess or soft
tissue inflammation at the site of the PermCath in the left
supraclavicular region.

Review of the MIP images confirms the above findings.
IMPRESSION: 1. Small pulmonary emboli in the right lower lobe with a focal area
of consolidation and some parenchymal calcification suggesting that
this is not an acute process or could be acute or chronic pulmonary
infarct.
2. Moderate right pleural effusion.
3. Chronic cardiomegaly, slightly increased. Coronary artery and
aortic atherosclerosis.

## 2018-07-31 NOTE — Progress Notes (Signed)
Patient ID: [name], male    DOB: 1955-12-30, 63 y.o.   MRN: [id number]  HPI  [name] is a 63 y/o male with a history of asthma, CAD, hyperlipidemia, HTN, CKD (ESRD on dialysis), anemia, COPD, GERD, PVD, MI, obstructive sleep apnea, left BKA, current tobacco use and chronic heart failure.   Echo report from 07/23/17 reviewed and showed an EF of 20-25%.  Was in the ED 07/22/2018 due to nosebleed that wouldn't stop. Given Afrin but bleeding continued. Had to place merocel sponge and given antibiotics and then released with ENT follow-up. Admitted 04/22/18 due to pneumonia. Given antibiotics and was discharged after 2 days.    He presents today for a follow-up visit with a chief complaint of moderate fatigue upon minimal exertion. He describes this as chronic in nature having been present for several years. He has associated chest pain, shortness of breath and pedal edema along with this. He denies any difficulty sleeping, abdominal distention, palpitations or wheezing.   His biggest complaint is of some intermittent chest pain that has been present for the last few days. He says that it's a sharp pain that on a scale of 0-10, is a 10 when it hurts. The pain starts upper mid-chest anteriorly with radiation down his left arm. He has some questionable nausea but denies any sweating, clamminess, jaw pain, back pain or worsening shortness of breath. He will take 1 SL NTG and the pain completely resolves. He is scheduled for an echo on 3/17 and cardiology visit 3/24.   Past Medical History:  Diagnosis Date  . 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 Amputee, below knee, left ([place])   . Anemia   . Asthma   . Cardiomyopathy ([place])   . CHF (congestive heart failure) ([place])   . Chronic systolic heart failure ([place])   . Complication of anesthesia    hypotension  . COPD (chronic obstructive pulmonary disease) ([place])   . Coronary artery disease   . Dialysis patient ([place])    Mon, Wed, Fri   . End stage renal disease ([place])   . GERD (gastroesophageal reflux disease)   . Headache   . History of kidney stones   . History of pulmonary embolism   . HLD (hyperlipidemia)   . HTN (hypertension)   . Hyperparathyroidism   . Myocardial infarction ([place])   . Peripheral vascular disease ([place])   . Shortness of breath dyspnea   . Sleep apnea    NO C-PAP, Patient stated in process of  "getting one"   . Tobacco dependence    Past Surgical History:  Procedure Laterality Date  . A/V FISTULAGRAM Left 08/14/2017   Procedure: A/V FISTULAGRAM;  Surgeon: Algernon Huxley, MD;  Location: [place] [facility];  Service: Cardiovascular;  Laterality: Left;  . AMPUTATION Left 05/06/2014   Procedure: AMPUTATION BELOW KNEE;  Surgeon: Elam Dutch, MD;  Location: [place];  Service: Vascular;  Laterality: Left;  . AMPUTATION Right 01/12/2015   Procedure: Foot transmetatarsal amputation;  Surgeon: Algernon Huxley, MD;  Location: [facility];  Service: Vascular;  Laterality: Right;  . APPLICATION OF WOUND VAC Right 03/01/2015   Procedure: Application of Bio-connekt graft and wound vac application to right foot ;  Surgeon: Algernon Huxley, MD;  Location: [facility];  Service: Vascular;  Laterality: Right;  . AV FISTULA PLACEMENT Left   . AV FISTULA PLACEMENT Left 11/28/2016   Procedure: ARTERIOVENOUS (AV) FISTULA CREATION;  Surgeon:  Algernon Huxley, MD;  Location: [facility];  Service: Vascular;  Laterality: Left;  . CARDIAC CATHETERIZATION     stent placement   . CORONARY ANGIOPLASTY    . DIALYSIS/PERMA CATHETER INSERTION N/A 07/31/2016   Procedure: Dialysis/Perma Catheter Insertion;  Surgeon: Algernon Huxley, MD;  Location: [place] [facility];  Service: Cardiovascular;  Laterality: N/A;  . DIALYSIS/PERMA CATHETER INSERTION N/A 07/25/2017   Procedure: DIALYSIS/PERMA CATHETER INSERTION and fistulagram;  Surgeon: Algernon Huxley, MD;  Location: [place] [facility];  Service: Cardiovascular;  Laterality: N/A;  . DIALYSIS/PERMA CATHETER  REMOVAL N/A 07/22/2017   Procedure: DIALYSIS/PERMA CATHETER REMOVAL;  Surgeon: Katha Cabal, MD;  Location: [place] [facility];  Service: Cardiovascular;  Laterality: N/A;  . DIALYSIS/PERMA CATHETER REMOVAL N/A 02/05/2018   Procedure: DIALYSIS/PERMA CATHETER REMOVAL;  Surgeon: Algernon Huxley, MD;  Location: [place] [facility];  Service: Cardiovascular;  Laterality: N/A;  . IR FLUORO GUIDE CV LINE LEFT  04/10/2017  . LIGATION OF ARTERIOVENOUS  FISTULA Right 01/31/2016   Procedure: LIGATION OF ARTERIOVENOUS  FISTULA;  Surgeon: Algernon Huxley, MD;  Location: [facility];  Service: Vascular;  Laterality: Right;  . PERIPHERAL VASCULAR CATHETERIZATION Right 12/15/2014   Procedure: Lower Extremity Angiography;  Surgeon: Algernon Huxley, MD;  Location: [place] [facility];  Service: Cardiovascular;  Laterality: Right;  . PERIPHERAL VASCULAR CATHETERIZATION  12/15/2014   Procedure: Lower Extremity Intervention;  Surgeon: Algernon Huxley, MD;  Location: [place] [facility];  Service: Cardiovascular;;  . PERIPHERAL VASCULAR CATHETERIZATION Right 08/14/2015   Procedure: A/V Shuntogram/Fistulagram;  Surgeon: Algernon Huxley, MD;  Location: [place] [facility];  Service: Cardiovascular;  Laterality: Right;  . PERIPHERAL VASCULAR CATHETERIZATION N/A 08/14/2015   Procedure: A/V Shunt Intervention;  Surgeon: Algernon Huxley, MD;  Location: [name] [facility];  Service: Cardiovascular;  Laterality: N/A;  . PERIPHERAL VASCULAR CATHETERIZATION N/A 01/11/2016   Procedure: Dialysis/Perma Catheter Insertion;  Surgeon: Algernon Huxley, MD;  Location: [place] [facility];  Service: Cardiovascular;  Laterality: N/A;  . REVISON OF ARTERIOVENOUS FISTULA Right 02/17/2016   Procedure: removal of AV fistula;  Surgeon: Serafina Mitchell, MD;  Location: [facility];  Service: Vascular;  Laterality: Right;  . REVISON OF ARTERIOVENOUS FISTULA Right 01/31/2016   Procedure: REVISON OF ARTERIOVENOUS FISTULA ( BRACHIOCEPHALIC ) W/ ARTEGRAFT;  Surgeon: Algernon Huxley, MD;  Location: [facility];  Service: Vascular;  Laterality: Right;  . TRANSMETATARSAL AMPUTATION Right 05/04/2015   Procedure: TRANSMETATARSAL AMPUTATION REVISION, great toe amputation;  Surgeon: Algernon Huxley, MD;  Location: [facility];  Service: Vascular;  Laterality: Right;   Family History  Problem Relation Age of Onset  . Leukemia Mother   . Heart attack Father   . Heart failure Other   . Hypertension Other   . Leukemia Other   . Diabetes Other   . Prostate cancer Neg Hx   . Kidney cancer Neg Hx   . Bladder Cancer Neg Hx    Social History   Tobacco Use  . Smoking status: Light Tobacco Smoker    Packs/day: 0.25    Years: 30.00    Pack years: 7.50    Types: Cigarettes  . Smokeless tobacco: Never Used  . Tobacco comment: 5  Substance Use Topics  . Alcohol use: No    Alcohol/week: 0.0 standard drinks   Allergies  Allergen Reactions  . Dust Mite Extract Other (See Comments)    Reaction: unknown   Prior to  Admission medications   Medication Sig Start Date End Date Taking? Authorizing Provider  acetaminophen (TYLENOL) 500 MG tablet Take 500 mg by mouth every 6 (six) hours as needed for mild pain.    Yes [provider]  albuterol (PROVENTIL HFA;VENTOLIN HFA) 108 (90 Base) MCG/ACT inhaler Inhale 2 puffs into the lungs every 6 (six) hours as needed for wheezing or shortness of breath.    Yes [provider]  albuterol (PROVENTIL) (2.5 MG/3ML) 0.083% nebulizer solution Take 3 mLs (2.5 mg total) by nebulization every 6 (six) hours as needed for wheezing or shortness of breath. 04/24/18  Yes Gladstone Lighter, MD  apixaban (ELIQUIS) 5 MG TABS tablet Take 1 tablet (5 mg total) by mouth 2 (two) times daily. 01/20/17  Yes Gladstone Lighter, MD  aspirin EC 325 MG tablet Take 325 mg by mouth daily.    Yes [provider]  atorvastatin (LIPITOR) 10 MG tablet Take 10 mg by mouth at bedtime.    Yes [provider]  budesonide-formoterol (SYMBICORT)  160-4.5 MCG/ACT inhaler Inhale 2 puffs into the lungs 2 (two) times daily.   Yes [provider]  calcium acetate (PHOSLO) 667 MG capsule Take 667 mg by mouth daily.    Yes [provider]  carvedilol (COREG) 6.25 MG tablet Take 1 tablet (6.25 mg total) by mouth 2 (two) times daily. 10/07/17  Yes Cleatis Fandrich, Otila Kluver A, FNP  cetirizine (ZYRTEC) 10 MG tablet Take 10 mg by mouth daily.   Yes [provider]  docusate sodium (COLACE) 100 MG capsule Take 100 mg by mouth daily as needed for mild constipation or moderate constipation.    Yes [provider]  erythromycin ophthalmic ointment Place 1 application into the right eye at bedtime.   Yes [provider]  fluticasone (FLONASE) 50 MCG/ACT nasal spray Place 1 spray into both nostrils daily.    Yes [provider]  guaifenesin (ROBAFEN) 100 MG/5ML syrup Take 200 mg by mouth 3 (three) times daily as needed for cough.   Yes [provider]  hydrocortisone 2.5 % cream Apply 1 application topically 2 (two) times daily as needed (itching).    Yes [provider]  Ipratropium-Albuterol (COMBIVENT RESPIMAT) 20-100 MCG/ACT AERS respimat Inhale 1 puff into the lungs every 6 (six) hours as needed for wheezing or shortness of breath.   Yes [provider]  isosorbide mononitrate (IMDUR) 30 MG 24 hr tablet Take 30 mg by mouth daily.  08/22/16  Yes [provider]  midodrine (PROAMATINE) 10 MG tablet Take 10 mg by mouth every 8 (eight) hours.    Yes [provider]  Multiple Vitamins-Minerals (THERA-M PO) Take 1 tablet by mouth 2 (two) times daily.   Yes [provider]  nitroGLYCERIN (NITROSTAT) 0.4 MG SL tablet Place 0.4 mg under the tongue every 5 (five) minutes as needed for chest pain.   Yes [provider]  pantoprazole (PROTONIX) 20 MG tablet Take 20 mg by mouth daily.   Yes [provider]  polyethylene glycol (MIRALAX / GLYCOLAX) packet Take  17 g by mouth daily as needed for mild constipation.    Yes [provider]  pregabalin (LYRICA) 75 MG capsule Take 1 capsule (75 mg total) by mouth daily. 01/22/17  Yes Gladstone Lighter, MD  sevelamer carbonate (RENVELA) 800 MG tablet Take 800 mg by mouth 3 (three) times daily with meals.    Yes [provider]  tiotropium (SPIRIVA) 18 MCG inhalation capsule Place 18 mcg into  inhaler and inhale daily.   Yes [provider]  traMADol (ULTRAM) 50 MG tablet Take 75 mg by mouth every 8 (eight) hours as needed (pain).   Yes [provider]     Review of Systems  Constitutional: Positive for appetite change (decreased appetite) and fatigue (tire easily).  HENT: Positive for nosebleeds. Negative for congestion, postnasal drip and sore throat.   Eyes: Negative for visual disturbance.  Respiratory: Positive for shortness of breath (wearing oxygen at 3L at bedtime and PRN during the day). Negative for chest tightness and wheezing.   Cardiovascular: Positive for chest pain (with exertion) and leg swelling. Negative for palpitations.  Gastrointestinal: Negative for abdominal distention and abdominal pain.  Endocrine: Negative.   Genitourinary: Negative.   Musculoskeletal: Positive for arthralgias (left leg phantom pain). Negative for back pain.  Skin: Negative.   Allergic/Immunologic: Negative.   Neurological: Positive for weakness (right leg). Negative for dizziness, light-headedness and headaches.  Hematological: Negative for adenopathy. Does not bruise/bleed easily.  Psychiatric/Behavioral: Negative for dysphoric mood and sleep disturbance (wearing oxygen/ humidifier at bedtime). The patient is not nervous/anxious.    Vitals:   08/04/18 1157  BP: 111/75  Pulse: 72  Resp: 18  SpO2: 100%  Weight: 210 lb (95.3 kg)  Height: 5\' 9"  (1.753 m)   Wt Readings from Last 3 Encounters:  08/04/18 210 lb (95.3 kg)  07/22/18 215 lb (97.5 kg)  04/24/18 203 lb 14.8 oz  (92.5 kg)   Lab Results  Component Value Date   CREATININE 7.83 (H) 04/24/2018   CREATININE 7.21 (H) 04/22/2018   CREATININE 10.04 (H) 12/23/2017    Physical Exam  Constitutional: He is oriented to person, place, and time. He appears well-developed and well-nourished.  HENT:  Head: Normocephalic and atraumatic.  Neck: Normal range of motion. Neck supple. No JVD present.  Cardiovascular: Normal rate and regular rhythm.  Pulmonary/Chest: Effort normal. He has no wheezes. He has no rales.  Abdominal: Soft. He exhibits no distension. There is no abdominal tenderness.  Musculoskeletal:        General: Edema (trace pitting in right lower leg) present. No tenderness.     Comments: No palpable chest tenderness  Neurological: He is alert and oriented to person, place, and time.  Skin: Skin is warm and dry.  Psychiatric: He has a normal mood and affect. His behavior is normal. Thought content normal.  Nursing note and vitals reviewed.  Assessment & Plan:  1: Chronic heart failure with reduced ejection fraction- - NYHA class III - euvolemic today - not being weighed due to left BKA and weakness in his right lower leg  - not adding salt to his food and he says that [employer] with salt either - saw cardiology Nehemiah Massed) 07/14/2018 - saw pulmonologist Ashby Dawes) 12/02/17 - due to history of hyperkalemia, doubtful we can use entresto - BNP on 04/22/18 was 1672.0 - reports receiving his flu vaccine for this season   2: HTN- - BP looks good although on the low side - sees PCP Clide Deutscher) at [place] on 04/24/18 reviewed and showed sodium 138, potassium 4.2, creatinine 7.83 and GFR 8  3: ESRD- - receives dialysis M, W, F - saw vascular ([name]) 03/10/18 - saw wound center Joaquim Lai) 07/24/2018)  4: Chest pain- - EKG done which shows LBBB which is unchanged from previous EKG done Feb 2020 - will get BMP, BNP, troponin and cardiac panel to rule out MI -  will  also call patient's cardiologist   Facility medication list was reviewed.  Return in 3 months or sooner for any questions/problems before then.

## 2018-08-04 ENCOUNTER — Telehealth: Payer: Self-pay | Admitting: Family

## 2018-08-04 ENCOUNTER — Encounter: Payer: Self-pay | Admitting: Pharmacist

## 2018-08-04 ENCOUNTER — Encounter: Payer: Medicare Other | Attending: Physician Assistant | Admitting: Physician Assistant

## 2018-08-04 ENCOUNTER — Encounter: Payer: Self-pay | Admitting: Family

## 2018-08-04 ENCOUNTER — Ambulatory Visit: Payer: Medicare Other | Attending: Family | Admitting: Family

## 2018-08-04 VITALS — BP 111/75 | HR 72 | Resp 18 | Ht 69.0 in | Wt 210.0 lb

## 2018-08-04 DIAGNOSIS — I5022 Chronic systolic (congestive) heart failure: Secondary | ICD-10-CM | POA: Diagnosis not present

## 2018-08-04 DIAGNOSIS — G629 Polyneuropathy, unspecified: Secondary | ICD-10-CM | POA: Insufficient documentation

## 2018-08-04 DIAGNOSIS — N186 End stage renal disease: Secondary | ICD-10-CM | POA: Insufficient documentation

## 2018-08-04 DIAGNOSIS — Z7901 Long term (current) use of anticoagulants: Secondary | ICD-10-CM | POA: Diagnosis not present

## 2018-08-04 DIAGNOSIS — Z79899 Other long term (current) drug therapy: Secondary | ICD-10-CM | POA: Diagnosis not present

## 2018-08-04 DIAGNOSIS — I739 Peripheral vascular disease, unspecified: Secondary | ICD-10-CM | POA: Diagnosis not present

## 2018-08-04 DIAGNOSIS — Z992 Dependence on renal dialysis: Secondary | ICD-10-CM | POA: Insufficient documentation

## 2018-08-04 DIAGNOSIS — J449 Chronic obstructive pulmonary disease, unspecified: Secondary | ICD-10-CM | POA: Insufficient documentation

## 2018-08-04 DIAGNOSIS — I429 Cardiomyopathy, unspecified: Secondary | ICD-10-CM | POA: Insufficient documentation

## 2018-08-04 DIAGNOSIS — I252 Old myocardial infarction: Secondary | ICD-10-CM | POA: Diagnosis not present

## 2018-08-04 DIAGNOSIS — Z8249 Family history of ischemic heart disease and other diseases of the circulatory system: Secondary | ICD-10-CM | POA: Insufficient documentation

## 2018-08-04 DIAGNOSIS — I10 Essential (primary) hypertension: Secondary | ICD-10-CM

## 2018-08-04 DIAGNOSIS — T8789 Other complications of amputation stump: Secondary | ICD-10-CM | POA: Insufficient documentation

## 2018-08-04 DIAGNOSIS — Z87442 Personal history of urinary calculi: Secondary | ICD-10-CM | POA: Insufficient documentation

## 2018-08-04 DIAGNOSIS — I7389 Other specified peripheral vascular diseases: Secondary | ICD-10-CM | POA: Diagnosis not present

## 2018-08-04 DIAGNOSIS — R0789 Other chest pain: Secondary | ICD-10-CM

## 2018-08-04 DIAGNOSIS — Z86711 Personal history of pulmonary embolism: Secondary | ICD-10-CM | POA: Insufficient documentation

## 2018-08-04 DIAGNOSIS — Z7982 Long term (current) use of aspirin: Secondary | ICD-10-CM | POA: Insufficient documentation

## 2018-08-04 DIAGNOSIS — K219 Gastro-esophageal reflux disease without esophagitis: Secondary | ICD-10-CM | POA: Insufficient documentation

## 2018-08-04 DIAGNOSIS — Z89421 Acquired absence of other right toe(s): Secondary | ICD-10-CM | POA: Insufficient documentation

## 2018-08-04 DIAGNOSIS — I2510 Atherosclerotic heart disease of native coronary artery without angina pectoris: Secondary | ICD-10-CM | POA: Diagnosis not present

## 2018-08-04 DIAGNOSIS — Y835 Amputation of limb(s) as the cause of abnormal reaction of the patient, or of later complication, without mention of misadventure at the time of the procedure: Secondary | ICD-10-CM | POA: Diagnosis not present

## 2018-08-04 DIAGNOSIS — G4730 Sleep apnea, unspecified: Secondary | ICD-10-CM | POA: Diagnosis not present

## 2018-08-04 DIAGNOSIS — D631 Anemia in chronic kidney disease: Secondary | ICD-10-CM | POA: Diagnosis not present

## 2018-08-04 DIAGNOSIS — L89899 Pressure ulcer of other site, unspecified stage: Secondary | ICD-10-CM | POA: Diagnosis not present

## 2018-08-04 DIAGNOSIS — I132 Hypertensive heart and chronic kidney disease with heart failure and with stage 5 chronic kidney disease, or end stage renal disease: Secondary | ICD-10-CM | POA: Insufficient documentation

## 2018-08-04 DIAGNOSIS — E785 Hyperlipidemia, unspecified: Secondary | ICD-10-CM | POA: Diagnosis not present

## 2018-08-04 DIAGNOSIS — F17210 Nicotine dependence, cigarettes, uncomplicated: Secondary | ICD-10-CM | POA: Diagnosis not present

## 2018-08-04 DIAGNOSIS — I5042 Chronic combined systolic (congestive) and diastolic (congestive) heart failure: Secondary | ICD-10-CM | POA: Diagnosis not present

## 2018-08-04 DIAGNOSIS — Z89512 Acquired absence of left leg below knee: Secondary | ICD-10-CM | POA: Diagnosis not present

## 2018-08-04 DIAGNOSIS — Z89431 Acquired absence of right foot: Secondary | ICD-10-CM | POA: Insufficient documentation

## 2018-08-04 DIAGNOSIS — G4733 Obstructive sleep apnea (adult) (pediatric): Secondary | ICD-10-CM | POA: Diagnosis not present

## 2018-08-04 DIAGNOSIS — I447 Left bundle-branch block, unspecified: Secondary | ICD-10-CM | POA: Insufficient documentation

## 2018-08-04 LAB — CK TOTAL AND CKMB (NOT AT ARMC)
CK, MB: 5.2 ng/mL — AB (ref 0.5–5.0)
Relative Index: 3.6 — ABNORMAL HIGH (ref 0.0–2.5)
Total CK: 145 U/L (ref 49–397)

## 2018-08-04 LAB — BRAIN NATRIURETIC PEPTIDE: B Natriuretic Peptide: 629 pg/mL — ABNORMAL HIGH (ref 0.0–100.0)

## 2018-08-04 LAB — BASIC METABOLIC PANEL
Anion gap: 12 (ref 5–15)
BUN: 56 mg/dL — ABNORMAL HIGH (ref 8–23)
CO2: 29 mmol/L (ref 22–32)
Calcium: 9.6 mg/dL (ref 8.9–10.3)
Chloride: 100 mmol/L (ref 98–111)
Creatinine, Ser: 8.38 mg/dL — ABNORMAL HIGH (ref 0.61–1.24)
GFR calc Af Amer: 7 mL/min — ABNORMAL LOW (ref >60)
GFR calc non Af Amer: 6 mL/min — ABNORMAL LOW (ref >60)
GLUCOSE: 98 mg/dL (ref 70–99)
Potassium: 3.7 mmol/L (ref 3.5–5.1)
Sodium: 141 mmol/L (ref 135–145)

## 2018-08-04 LAB — TROPONIN I: Troponin I: 0.22 ng/mL (ref <0.03)

## 2018-08-04 NOTE — Telephone Encounter (Signed)
[facility] cardiology and spoke with nurse Baker Janus regarding previous messages of patient's symptoms as well as his critical troponin of 0.22 and other lab results.   Explained that I was getting ready to leave for the day and wanted to make sure that all this information got to patient's cardiologist, Dr. Nehemiah Massed before he left for the day.   She says that she sees the previous messages and that she will make sure that Dr. Nehemiah Massed sees the lab results before he leaves today. She says that they will call [facility] if anything further needs to be done before his echo next week.

## 2018-08-04 NOTE — Progress Notes (Signed)
[place] - PHARMACIST COUNSELING NOTE  ADHERENCE ASSESSMENT  Adherence strategy: lives at [facility] which administers all medications   Do you ever forget to take your medication? [] Yes (1) [x] No (0)  Do you ever skip doses due to side effects? [] Yes (1) [x] No (0)  Do you have trouble affording your medicines? [] Yes (1) [x] No (0)  Are you ever unable to pick up your medication due to transportation difficulties? [] Yes (1) [x] No (0)  Do you ever stop taking your medications because you don't believe they are helping? [] Yes (1) [x] No (0)  Total score _0______    Recommendations given to patient about increasing adherence: None  Guideline-Directed Medical Therapy/Evidence Based Medicine    ACE/ARB/ARNI: none   Beta Blocker: carvedilol 6.25 mg twice daily   Aldosterone Antagonist: none Diuretic: none    SUBJECTIVE   HPI: Here for follow up visit. He reports chest pain that has been occurring through the weekend. He took 3 nitroglycerin tablets over the course of the weekend.  Past Medical History:  Diagnosis Date  . 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 Amputee, below knee, left ([place])   . Anemia   . Asthma   . Cardiomyopathy ([place])   . CHF (congestive heart failure) ([place])   . Chronic systolic heart failure ([place])   . Complication of anesthesia    hypotension  . COPD (chronic obstructive pulmonary disease) ([place])   . Coronary artery disease   . Dialysis patient ([place])    Mon, Wed, Fri  . End stage renal disease ([place])   . GERD (gastroesophageal reflux disease)   . Headache   . History of kidney stones   . History of pulmonary embolism   . HLD (hyperlipidemia)   . HTN (hypertension)   . Hyperparathyroidism   . Myocardial infarction ([place])   . Peripheral vascular disease ([place])   . Shortness of breath dyspnea   . Sleep apnea    NO C-PAP, Patient stated in process of  "getting one"   . Tobacco dependence          OBJECTIVE    Vital signs: HR 72, BP 111/75, weight (pounds) 210  ECHO: Date 07/23/17, EF 20-25%    BMP Latest Ref Rng & Units 04/24/2018 04/22/2018 12/23/2017  Glucose 70 - 99 mg/dL 106(H) 84 118(H)  BUN 8 - 23 mg/dL 56(H) 44(H) 69(H)  Creatinine 0.61 - 1.24 mg/dL 7.83(H) 7.21(H) 10.04(H)  Sodium 135 - 145 mmol/L 138 137 140  Potassium 3.5 - 5.1 mmol/L 4.2 3.9 4.7  Chloride 98 - 111 mmol/L 99 96(L) 105  CO2 22 - 32 mmol/L 28 31 23   Calcium 8.9 - 10.3 mg/dL 8.8(L) 9.1 8.9    ASSESSMENT 63 year old male with HFrEF. He lives at the [facility]. He reports chest pain that has occurred through the weekend. He took 3 nitroglycerin tablets throughout the weekend. The nitroglycerin would help for a few hours and the chest pain would return. Endorses shortness of breath and maybe some increase in swelling in his legs. Says he is not weighed daily.    PLAN May benefit patient to see or follow up with Cardiologist for evaluation of recurrent chest pain. Consider optimization of HF regimen as able at future visits.    Time spent: 10 minutes  [place], Pharm.D. 08/04/2018 12:31 PM    Current Outpatient Medications:  .  acetaminophen (TYLENOL) 500 MG tablet, Take 500 mg by mouth  every 6 (six) hours as needed for mild pain. , Disp: , Rfl:  .  albuterol (PROVENTIL HFA;VENTOLIN HFA) 108 (90 Base) MCG/ACT inhaler, Inhale 2 puffs into the lungs every 6 (six) hours as needed for wheezing or shortness of breath. , Disp: , Rfl:  .  albuterol (PROVENTIL) (2.5 MG/3ML) 0.083% nebulizer solution, Take 3 mLs (2.5 mg total) by nebulization every 6 (six) hours as needed for wheezing or shortness of breath., Disp: 300 mL, Rfl: 1 .  apixaban (ELIQUIS) 5 MG TABS tablet, Take 1 tablet (5 mg total) by mouth 2 (two) times daily., Disp: 60 tablet, Rfl: 2 .  aspirin EC 325 MG tablet, Take 325 mg by mouth daily. , Disp: , Rfl:  .  atorvastatin (LIPITOR) 10 MG tablet, Take 10 mg by mouth at bedtime. , Disp:  , Rfl:  .  budesonide-formoterol (SYMBICORT) 160-4.5 MCG/ACT inhaler, Inhale 2 puffs into the lungs 2 (two) times daily., Disp: , Rfl:  .  calcium acetate (PHOSLO) 667 MG capsule, Take 667 mg by mouth daily. , Disp: , Rfl:  .  carvedilol (COREG) 6.25 MG tablet, Take 1 tablet (6.25 mg total) by mouth 2 (two) times daily., Disp: 180 tablet, Rfl: 3 .  cetirizine (ZYRTEC) 10 MG tablet, Take 10 mg by mouth daily., Disp: , Rfl:  .  docusate sodium (COLACE) 100 MG capsule, Take 100 mg by mouth daily as needed for mild constipation or moderate constipation. , Disp: , Rfl:  .  erythromycin ophthalmic ointment, Place 1 application into the right eye at bedtime., Disp: , Rfl:  .  fluticasone (FLONASE) 50 MCG/ACT nasal spray, Place 1 spray into both nostrils daily. , Disp: , Rfl:  .  guaifenesin (ROBAFEN) 100 MG/5ML syrup, Take 200 mg by mouth 3 (three) times daily as needed for cough., Disp: , Rfl:  .  hydrocortisone 2.5 % cream, Apply 1 application topically 2 (two) times daily as needed (itching). , Disp: , Rfl:  .  Ipratropium-Albuterol (COMBIVENT RESPIMAT) 20-100 MCG/ACT AERS respimat, Inhale 1 puff into the lungs every 6 (six) hours as needed for wheezing or shortness of breath., Disp: , Rfl:  .  isosorbide mononitrate (IMDUR) 30 MG 24 hr tablet, Take 30 mg by mouth daily. , Disp: , Rfl:  .  midodrine (PROAMATINE) 10 MG tablet, Take 10 mg by mouth every 8 (eight) hours. , Disp: , Rfl:  .  Multiple Vitamins-Minerals (THERA-M PO), Take 1 tablet by mouth 2 (two) times daily., Disp: , Rfl:  .  nitroGLYCERIN (NITROSTAT) 0.4 MG SL tablet, Place 0.4 mg under the tongue every 5 (five) minutes as needed for chest pain., Disp: , Rfl:  .  pantoprazole (PROTONIX) 20 MG tablet, Take 20 mg by mouth daily., Disp: , Rfl:  .  polyethylene glycol (MIRALAX / GLYCOLAX) packet, Take 17 g by mouth daily as needed for mild constipation. , Disp: , Rfl:  .  pregabalin (LYRICA) 75 MG capsule, Take 1 capsule (75 mg total) by mouth  daily., Disp: 20 capsule, Rfl: 0 .  sevelamer carbonate (RENVELA) 800 MG tablet, Take 800 mg by mouth 3 (three) times daily with meals. , Disp: , Rfl:  .  tiotropium (SPIRIVA) 18 MCG inhalation capsule, Place 18 mcg into inhaler and inhale daily., Disp: , Rfl:  .  traMADol (ULTRAM) 50 MG tablet, Take 75 mg by mouth every 8 (eight) hours as needed (pain)., Disp: , Rfl:    COUNSELING POINTS/CLINICAL PEARLS  Carvedilol (Goal: weight less than 85 kg is  25 mg BID, weight greater than 85 kg is 50 mg BID)  Patient should avoid activities requiring coordination until drug effects are realized, as drug may cause dizziness.  This drug may cause diarrhea, nausea, vomiting, arthralgia, back pain, myalgia, headache, vision disorder, erectile dysfunction, reduced libido, or fatigue.  Instruct patient to report signs/symptoms of adverse cardiovascular effects such as hypotension (especially in elderly patients), arrhythmias, syncope, palpitations, angina, or edema.  Drug may mask symptoms of hypoglycemia. Advise diabetic patients to carefully monitor blood sugar levels.  Patient should take drug with food.  Advise patient against sudden discontinuation of drug.   DRUGS TO AVOID IN HEART FAILURE  Drug or Class Mechanism  Analgesics . NSAIDs . COX-2 inhibitors . Glucocorticoids  Sodium and water retention, increased systemic vascular resistance, decreased response to diuretics   Diabetes Medications . Metformin . Thiazolidinediones o Rosiglitazone (Avandia) o Pioglitazone (Actos) . DPP4 Inhibitors o Saxagliptin (Onglyza) o Sitagliptin (Januvia)   Lactic acidosis Possible calcium channel blockade   Unknown  Antiarrhythmics . Class I  o Flecainide o Disopyramide . Class III o Sotalol . Other o Dronedarone  Negative inotrope, proarrhythmic   Proarrhythmic, beta blockade  Negative inotrope  Antihypertensives . Alpha Blockers o Doxazosin . Calcium Channel  Blockers o Diltiazem o Verapamil o Nifedipine . Central Alpha Adrenergics o Moxonidine . Peripheral Vasodilators o Minoxidil  Increases renin and aldosterone  Negative inotrope    Possible sympathetic withdrawal  Unknown  Anti-infective . Itraconazole . Amphotericin B  Negative inotrope Unknown  Hematologic . Anagrelide . Cilostazol   Possible inhibition of PD IV Inhibition of PD III causing arrhythmias  Neurologic/Psychiatric . Stimulants . Anti-Seizure Drugs o Carbamazepine o Pregabalin . Antidepressants o Tricyclics o Citalopram . Parkinsons o Bromocriptine o Pergolide o Pramipexole . Antipsychotics o Clozapine . Antimigraine o Ergotamine o Methysergide . Appetite suppressants . Bipolar o Lithium  Peripheral alpha and beta agonist activity  Negative inotrope and chronotrope Calcium channel blockade  Negative inotrope, proarrhythmic Dose-dependent QT prolongation  Excessive serotonin activity/valvular damage Excessive serotonin activity/valvular damage Unknown  IgE mediated hypersensitivy, calcium channel blockade  Excessive serotonin activity/valvular damage Excessive serotonin activity/valvular damage Valvular damage  Direct myofibrillar degeneration, adrenergic stimulation  Antimalarials . Chloroquine . Hydroxychloroquine Intracellular inhibition of lysosomal enzymes  Urologic Agents . Alpha Blockers o Doxazosin o Prazosin o Tamsulosin o Terazosin  Increased renin and aldosterone  Adapted from Page RL, et al. "Drugs That May Cause or Exacerbate Heart Failure: A Scientific Statement from the [place]." Circulation 2016; 284:X32-G40. DOI: 10.1161/CIR.0000000000000426   MEDICATION ADHERENCES TIPS AND STRATEGIES 1. Taking medication as prescribed improves patient outcomes in heart failure (reduces hospitalizations, improves symptoms, increases survival) 2. Side effects of medications can be managed by decreasing  doses, switching agents, stopping drugs, or adding additional therapy. Please let someone in the [facility] know if you have having bothersome side effects so we can modify your regimen. Do not alter your medication regimen without talking to [place].  3. Medication reminders can help patients remember to take drugs on time. If you are missing or forgetting doses you can try linking behaviors, using pill boxes, or an electronic reminder like an alarm on your phone or an app. Some people can also get automated phone calls as medication reminders.

## 2018-08-04 NOTE — Telephone Encounter (Signed)
[name] and spoke with receptionist, [name], as the nurse and provider were unavailable.   Explained that patient had been having intermittent episodes of chest pain for the last few days with radiation down the left arm. Rates it a 10 on a 0-10 scale and is sharp in quality. Denies sweating, jaw pain, back pain or worsening shortness of breath. Questionable nausea. 1 SL NTG relieves chest pain every time.  Has echo scheduled 3/17 with office visit with Dr. [name] already scheduled for 3/24.   EKG done in the office showing LBBB (unchanged from previous EKG). BMP, BNP, tropinin level and cardiac panel ordered.   [name] says that she will forward message to provider's nurse and they will follow up with [facility] where patient lives if he needs to be seen sooner.

## 2018-08-04 NOTE — Telephone Encounter (Signed)
Received critical troponin level of 0.22. CK-MB also slightly elevated at 5.2 Total CK 145 BNP 629 Creatinine 8.38 (ESRD/ dialysis dependent)  These results were called to [employer] [facility] cardiology receptionist) as provider Nehemiah Massed) and his nurse were unavailable.   She says that she will relay these results to Dr. Alveria Apley nurse.

## 2018-08-04 NOTE — Patient Instructions (Signed)
Continue weighing daily and call for an overnight weight gain of > 2 pounds or a weekly weight gain of >5 pounds. 

## 2018-08-06 NOTE — Progress Notes (Signed)
[name], [name] ([id number]) Visit Report for 08/04/2018 Chief Complaint Document Details Patient Name: [name], [name]. Date of Service: 08/04/2018 10:45 AM Medical Record Number: [id number] Patient Account Number: [id number] Date of Birth/Sex: May 21, 1956 (63 y.o. M) Treating RN: Montey Hora Primary Care Provider: Tomasa Hose Other Clinician: Referring Provider: Tomasa Hose Treating Provider/Extender: Melburn Hake, [place] Prout Weeks in Treatment: 5 Information Obtained from: Patient Chief Complaint Left stump pressure ulcer Electronic Signature(s) Signed: 08/05/2018 1:14:32 PM By: Worthy Keeler PA-C Entered By: Worthy Keeler on 08/04/2018 11:27:25 Weinrich, [name] ([id number]) -------------------------------------------------------------------------------- HPI Details Patient Name: [name], [name] Date of Service: 08/04/2018 10:45 AM Medical Record Number: [id number] Patient Account Number: [id number] Date of Birth/Sex: 08-13-1955 (63 y.o. M) Treating RN: Montey Hora Primary Care Provider: Tomasa Hose Other Clinician: Referring Provider: Tomasa Hose Treating Provider/Extender: Melburn Hake, Tahlia Deamer Weeks in Treatment: 5 History of Present Illness HPI Description: 63 year old male who was seen by others 2-1/2 years ago when he was being treated for vascular problems and dry gangrene of his right foot now returns with painful lesion on his penis for about a month. He was seen by the physicians assistant at the urology practice,who noted a easily retracted foreskin, patent urethral meatus and on the dorsal surface of the glands there was slough with healthy granulation tissue formation. 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 the patient is a smoker smoking about 10 cigarettes a day. past medical history of amputation of the right foot, left foot amputation, cardiomyopathy, CHF, hemodialysis  dependent, coronary artery disease, tobacco addiction. Seen by Dr. Leotis Pain on 08/29/2014 with ulceration on 2 toes on the right foot and dark discoloration worrisome for gangrenous changes on his 2nd and 3rd toes as well.o Given his previous history of below-knee amputation for peripheral disease and ulceration on the left, his multiple atherosclerotic risk factors and his poorly palpable pedal pulses, he was brought in for angiography for further evaluation and potential treatment. He had a percutaneous angioplasty of the right popliteal artery and the right anterior tibial artery and dorsalis pedis artery. He also had angioplasty of the right peroneal artery and the proximal and mid right posterior tibial artery. On 11/07/2014 he's had a duplex of the right lower extremity which showed the ABI was 0.9 and had a multiphasic waveform. Impression was of mild peripheral vascular disease predominantly small vessel disease right ankle. 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 11/22/2014 -- he has been off cigarettes completely since Saturday and is on a nicotine patch and have commended him about this. 12/08/2014 -- he was seen in the vascular office at [facility] and has been scheduled for a angiogram at the end of this month -- next Thursday. I have urged him to keep this appointment. 12/30/2014 -- since I saw him last on July 14 he had a procedure done by Dr. Lucky Cowboy on 12/15/2014. he had a aortogram and selective right lower extremity angiogram followed by a percutaneous angioplasty of the right popliteal artery, right anterior tibial artery, right peroneal artery, proximal and mid right posterior tibial artery. 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 Today he continues to  have pain and he says that some of his  toes are black. Readmission: 06/30/18 on evaluation today patient presents for reevaluation Concerning an altar on the invitation site below his knee. He states this is on the end where there is a ring on the bottom of his shrinker that seems to rather push against this area. Nonetheless this has been open for several weeks and really didn't seem to be getting better which is why he wanted to come to see [place]. Fortunately this does not appear to be too significant and he is having issues with minimal drainage which is good news. There's no signs of infection which is also good news. Overall I'm not too worried about the fact that we should be able to get this area to heal quite readily my biggest issue at this point is simply the fact that the patient is continuing to have what sounds like friction to the stump region which is gonna prevent this from being able to fully close and stay closed. He does sometimes wear his prosthesis although he has not for some time simply due to the fact that again he's had this wound. He does have a history of congestive heart failure, peripheral vascular disease, coronary artery disease, and in stage renal disease for which she is on dialysis. [name], [name] ([id number]) 07/10/18 on evaluation today patient actually appears to be doing very well in regard to his ulcer on the distal aspect of the left amputation site this actually appears to be doing very well. Fortunately he's been tolerating the dressing changes without complication. There does not appear to be any signs of infection at this time. 07/24/18 on evaluation today patient appears to be doing rather well in regard to his distal imputation site also. With that being said there is a lot of necrotic tissue buildup on the end of the wound I think this is due to too much moisture at the site. Subsequently I did discuss sharp debridement today which hopefully will help this  to heal more appropriately we also need to do some better moisture management in my opinion. 08/04/18 on evaluation today patient appears to be doing much better in regard to his left distal stump ulcer. He has been tolerating the dressing changes without complication. Fortunately there's no signs of infection at this time. Overall I've been very pleased with how things have been progressing since last time I saw him. Electronic Signature(s) Signed: 08/05/2018 1:14:32 PM By: Worthy Keeler PA-C Entered By: Worthy Keeler on 08/04/2018 13:55:08 Kings, [name] ([id number]) -------------------------------------------------------------------------------- Physical Exam Details Patient Name: [name], [name] Date of Service: 08/04/2018 10:45 AM Medical Record Number: [id number] Patient Account Number: [id number] Date of Birth/Sex: [birth year] (62 y.o. M) Treating RN: Montey Hora Primary Care Provider: Tomasa Hose Other Clinician: Referring Provider: Tomasa Hose Treating Provider/Extender: STONE III, Zemirah Krasinski Weeks in Treatment: 5 Constitutional Well-nourished and well-hydrated in no acute distress. Respiratory normal breathing without difficulty. clear to auscultation bilaterally. Cardiovascular regular rate and rhythm with normal S1, S2. Psychiatric this patient is able to make decisions and demonstrates good insight into disease process. Alert and Oriented x 3. pleasant and cooperative. Notes Patient's wound bed currently shows signs of new maceration on the other than that of drainage I think that this is doing much better and seems to be making excellent progress. The patient is very pleased at this point. Electronic Signature(s) Signed: 08/05/2018 1:14:32 PM By: Worthy Keeler PA-C Entered By: Worthy Keeler on 08/04/2018 13:55:38 Repetto, [name] ([id number]) -------------------------------------------------------------------------------- Physician Orders Details  Patient Name:  [name], [name]. Date of Service: 08/04/2018 10:45 AM Medical Record Number: [id number] Patient Account Number: [id number] Date of Birth/Sex: [birth year] (62 y.o. M) Treating RN: Montey Hora Primary Care Provider: Tomasa Hose Other Clinician: Referring Provider: Tomasa Hose Treating Provider/Extender: Melburn Hake, Nickey Canedo Weeks in Treatment: 5 Verbal / Phone Orders: No Diagnosis Coding ICD-10 Coding Code Description L98.492 Non-pressure chronic ulcer of skin of other sites with fat layer exposed Z89.512 Acquired absence of left leg below knee I50.42 Chronic combined systolic (congestive) and diastolic (congestive) heart failure I73.89 Other specified peripheral vascular diseases I25.119 Atherosclerotic heart disease of native coronary artery with unspecified angina pectoris Wound Cleansing Wound #6 Left,Midline Amputation Site - Below Knee o Clean wound with Normal Saline. o May Shower, gently pat wound dry prior to applying new dressing. Anesthetic (add to Medication List) Wound #6 Left,Midline Amputation Site - Below Knee o Topical Lidocaine 4% cream applied to wound bed prior to debridement (In Clinic Only). Primary Wound Dressing Wound #6 Left,Midline Amputation Site - Below Knee o Silver Alginate Secondary Dressing Wound #6 Left,Midline Amputation Site - Below Knee o Boardered Foam Dressing Dressing Change Frequency Wound #6 Left,Midline Amputation Site - Below Knee o Change Dressing Monday, Wednesday, Friday - Patient will be seen at [facility] in 2 weeks and will change dressing at that appointment. Al other times, HHRN to change dressing Follow-up Appointments Wound #6 Left,Midline Amputation Site - Below Knee o Return Appointment in 2 weeks. - Patient will be seen at [facility] in 2 weeks and will change dressing at that appointment. Al other times, HHRN to change dressing Off-Loading Wound #6 Left,Midline Amputation Site - Below Knee o Other: - continue not  wearing your prosthesis [place] ([id number]) Wound #6 Left,Midline Amputation Site - Below Knee o [place] Visits - Patient will be seen at [facility] in 2 weeks and will change dressing at that appointment. Al other times, HHRN to change dressing o Home Health Nurse may visit PRN to address patientos wound care needs. o FACE TO FACE ENCOUNTER: MEDICARE and MEDICAID PATIENTS: I certify that this patient is under my care and that I had a face-to-face encounter that meets the physician face-to-face encounter requirements with this patient on this date. The encounter with the patient was in whole or in part for the following MEDICAL CONDITION: (primary reason for [place]) MEDICAL NECESSITY: I certify, that based on my findings, NURSING services are a medically necessary home health service. HOME BOUND STATUS: I certify that my clinical findings support that this patient is homebound (i.e., Due to illness or injury, pt requires aid of supportive devices such as crutches, cane, wheelchairs, walkers, the use of special transportation or the assistance of another person to leave their place of residence. There is a normal inability to leave the home and doing so requires considerable and taxing effort. Other absences are for medical reasons / religious services and are infrequent or of short duration when for other reasons). o If current dressing causes regression in wound condition, may D/C ordered dressing product/s and apply Normal Saline Moist Dressing daily until next [place] / Other MD appointment. [place] of regression in wound condition at [phone]. o Please direct any NON-WOUND related issues/requests for orders to patient's Primary Care Physician Electronic Signature(s) Signed: 08/05/2018 1:14:32 PM By: Worthy Keeler PA-C Signed: 08/05/2018 4:23:17 PM By: Montey Hora Entered By: Montey Hora on  08/04/2018 11:33:25 [name], Joel Baltimore  Delane [name] ([id number]) -------------------------------------------------------------------------------- Problem List Details Patient Name: Bitter, BLASE BECKNER. Date of Service: 08/04/2018 10:45 AM Medical Record Number: [id number] Patient Account Number: [id number] Date of Birth/Sex: 1955-11-30 (62 y.o. M) Treating RN: Montey Hora Primary Care Provider: Tomasa Hose Other Clinician: Referring Provider: Tomasa Hose Treating Provider/Extender: Melburn Hake, Alika Eppes Weeks in Treatment: 5 Active Problems ICD-10 Evaluated Encounter Code Description Active Date Today Diagnosis L98.492 Non-pressure chronic ulcer of skin of other sites with fat layer 06/30/2018 No Yes exposed Z89.512 Acquired absence of left leg below knee 06/30/2018 No Yes I50.42 Chronic combined systolic (congestive) and diastolic 05/31/4006 No Yes (congestive) heart failure I73.89 Other specified peripheral vascular diseases 06/30/2018 No Yes I25.119 Atherosclerotic heart disease of native coronary artery with 06/30/2018 No Yes unspecified angina pectoris Inactive Problems Resolved Problems Electronic Signature(s) Signed: 08/05/2018 1:14:32 PM By: Worthy Keeler PA-C Entered By: Worthy Keeler on 08/04/2018 11:27:20 [name], [name] ([id number]) -------------------------------------------------------------------------------- Progress Note Details Patient Name: [name], [name] Date of Service: 08/04/2018 10:45 AM Medical Record Number: [id number] Patient Account Number: [id number] Date of Birth/Sex: November 18, 1955 (62 y.o. M) Treating RN: Montey Hora Primary Care Provider: Tomasa Hose Other Clinician: Referring Provider: Tomasa Hose Treating Provider/Extender: Melburn Hake, Huntley Demedeiros Weeks in Treatment: 5 Subjective Chief Complaint Information obtained from Patient Left stump pressure ulcer History of Present Illness (HPI) 63 year old male who was seen by others 2-1/2 years ago when he was being treated  for vascular problems and dry gangrene of his right foot now returns with painful lesion on his penis for about a month. He was seen by the physicians assistant at the urology practice,who noted a easily retracted foreskin, patent urethral meatus and on the dorsal surface of the glands there was slough with healthy granulation tissue formation. 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 the patient is a smoker smoking about 10 cigarettes a day. past medical history of amputation of the right foot, left foot amputation, cardiomyopathy, CHF, hemodialysis dependent, coronary artery disease, tobacco addiction. Seen by Dr. Leotis Pain on 08/29/2014 with ulceration on 2 toes on the right foot and dark discoloration worrisome for gangrenous changes on his 2nd and 3rd toes as well. Given his previous history of below-knee amputation for peripheral disease and ulceration on the left, his multiple atherosclerotic risk factors and his poorly palpable pedal pulses, he was brought in for angiography for further evaluation and potential treatment. He had a percutaneous angioplasty of the right popliteal artery and the right anterior tibial artery and dorsalis pedis artery. He also had angioplasty of the right peroneal artery and the proximal and mid right posterior tibial artery. On 11/07/2014 he's had a duplex of the right lower extremity which showed the ABI was 0.9 and had a multiphasic waveform. Impression was of mild peripheral vascular disease predominantly small vessel disease right ankle. 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 11/22/2014 -- he has been off cigarettes completely since Saturday and is on a nicotine patch and have commended him about this. 12/08/2014 -- he was seen in the vascular office at [facility] and has been scheduled for a  angiogram at the end of this month -- next Thursday. I have urged him to keep this appointment. 12/30/2014 -- since I saw him last on July 14 he had a procedure done by Dr. Lucky Cowboy on 12/15/2014. he had a aortogram and selective right lower  extremity angiogram followed by a percutaneous angioplasty of the right popliteal artery, right anterior tibial artery, right peroneal artery, proximal and mid right posterior tibial artery. 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 Today he continues to have pain and he says that some of his toes are black. Readmission: 06/30/18 on evaluation today patient presents for reevaluation Concerning an altar on the invitation site below his knee. He states this is on the end where there is a ring on the bottom of his shrinker that seems to rather push against this area. Nonetheless this has been open for several weeks and really didn't seem to be getting better which is why he wanted to come to see [place]. Fortunately this does not appear to be too significant and he is having issues with minimal drainage which is good news. There's no signs of infection which is also good news. Overall I'm not too worried about the fact that we should be able [name], [name] ([id number]) to get this area to heal quite readily my biggest issue at this point is simply the fact that the patient is continuing to have what sounds like friction to the stump region which is gonna prevent this from being able to fully close and stay closed. He does sometimes wear his prosthesis although he has not for some time simply due to the fact that again he's had this wound. He does have a history of congestive heart failure, peripheral vascular disease, coronary artery disease, and in stage renal disease for which she is on dialysis. 07/10/18 on evaluation today  patient actually appears to be doing very well in regard to his ulcer on the distal aspect of the left amputation site this actually appears to be doing very well. Fortunately he's been tolerating the dressing changes without complication. There does not appear to be any signs of infection at this time. 07/24/18 on evaluation today patient appears to be doing rather well in regard to his distal imputation site also. With that being said there is a lot of necrotic tissue buildup on the end of the wound I think this is due to too much moisture at the site. Subsequently I did discuss sharp debridement today which hopefully will help this to heal more appropriately we also need to do some better moisture management in my opinion. 08/04/18 on evaluation today patient appears to be doing much better in regard to his left distal stump ulcer. He has been tolerating the dressing changes without complication. Fortunately there's no signs of infection at this time. Overall I've been very pleased with how things have been progressing since last time I saw him. Patient History Information obtained from Patient. Family History Cancer - Paternal Grandparents, Diabetes - Father, Heart Disease - Father, Hypertension - Father, No family history of Hereditary Spherocytosis, Kidney Disease, Lung Disease, Seizures, Stroke, Thyroid Problems, Tuberculosis. Social History Current every day smoker, Marital Status - Widowed, Alcohol Use - Never, Drug Use - Prior History, Caffeine Use - Never. Medical History Eyes Denies history of Cataracts, Glaucoma, Optic Neuritis Ear/Nose/Mouth/Throat Denies history of Chronic sinus problems/congestion, Middle ear problems Hematologic/Lymphatic Patient has history of Anemia Denies history of Hemophilia, Human Immunodeficiency Virus, Lymphedema, Sickle Cell Disease Respiratory Patient has history of Asthma, Chronic Obstructive Pulmonary Disease (COPD), Sleep Apnea Denies history  of Aspiration, Pneumothorax, Tuberculosis Cardiovascular Patient  has history of Congestive Heart Failure, Coronary Artery Disease, Hypertension, Myocardial Infarction, Peripheral Venous Disease Denies history of Angina, Arrhythmia, Deep Vein Thrombosis, Hypotension, Peripheral Arterial Disease, Phlebitis, Vasculitis Gastrointestinal Denies history of Cirrhosis , Colitis, Crohn s, Hepatitis A, Hepatitis B, Hepatitis C Endocrine Denies history of Type I Diabetes, Type II Diabetes Genitourinary Patient has history of End Stage Renal Disease - HD MWF Immunological Denies history of Lupus Erythematosus, Raynaud s, Scleroderma Integumentary (Skin) Denies history of History of Burn, History of pressure wounds Musculoskeletal Denies history of Gout, Rheumatoid Arthritis, Osteoarthritis, Osteomyelitis Neurologic Patient has history of Neuropathy Klug, Naszir N. ([id number]) Denies history of Dementia, Quadriplegia, Paraplegia, Seizure Disorder Oncologic Denies history of Received Chemotherapy, Received Radiation Psychiatric Denies history of Anorexia/bulimia, Confinement Anxiety Hospitalization/Surgery History - 06/27/2014, [place], L BKA. Medical And Surgical History Notes Musculoskeletal L BKA Review of Systems (ROS) Constitutional Symptoms (General Health) Denies complaints or symptoms of Fever, Chills. Respiratory The patient has no complaints or symptoms. Cardiovascular The patient has no complaints or symptoms. Psychiatric The patient has no complaints or symptoms. Objective Constitutional Well-nourished and well-hydrated in no acute distress. Vitals Time Taken: 10:58 AM, Height: 74 in, Weight: 210 lbs, BMI: 27, Temperature: 97.8 F, Pulse: 71 bpm, Respiratory Rate: 16 breaths/min, Blood Pressure: 115/67 mmHg. Respiratory normal breathing without difficulty. clear to auscultation bilaterally. Cardiovascular regular rate and rhythm with normal S1, S2. Psychiatric this patient  is able to make decisions and demonstrates good insight into disease process. Alert and Oriented x 3. pleasant and cooperative. General Notes: Patient's wound bed currently shows signs of new maceration on the other than that of drainage I think that this is doing much better and seems to be making excellent progress. The patient is very pleased at this point. Integumentary (Hair, Skin) Wound #6 status is Open. Original cause of wound was Pressure Injury. The wound is located on the Left,Midline Amputation Site - Below Knee. The wound measures 1.2cm length x 1cm width x 0.2cm depth; 0.942cm^2 area and 0.188cm^3 volume. There is Fat Layer (Subcutaneous Tissue) Exposed exposed. There is no tunneling or undermining noted. There is a medium amount of serous drainage noted. The wound margin is flat and intact. There is medium (34-66%) pale granulation within the wound bed. There is a medium (34-66%) amount of necrotic tissue within the wound bed including Adherent Slough. The Evola, Joel DUNG. ([id number]) periwound skin appearance had no abnormalities noted for moisture. The periwound skin appearance exhibited: Scarring, Hemosiderin Staining. The periwound skin appearance did not exhibit: Callus, Crepitus, Excoriation, Induration, Rash, Atrophie Blanche, Cyanosis, Ecchymosis, Mottled, Pallor, Rubor, Erythema. Periwound temperature was noted as No Abnormality. The periwound has tenderness on palpation. Assessment Active Problems ICD-10 Non-pressure chronic ulcer of skin of other sites with fat layer exposed Acquired absence of left leg below knee Chronic combined systolic (congestive) and diastolic (congestive) heart failure Other specified peripheral vascular diseases Atherosclerotic heart disease of native coronary artery with unspecified angina pectoris Plan Wound Cleansing: Wound #6 Left,Midline Amputation Site - Below Knee: Clean wound with Normal Saline. May Shower, gently pat wound dry  prior to applying new dressing. Anesthetic (add to Medication List): Wound #6 Left,Midline Amputation Site - Below Knee: Topical Lidocaine 4% cream applied to wound bed prior to debridement (In Clinic Only). Primary Wound Dressing: Wound #6 Left,Midline Amputation Site - Below Knee: Silver Alginate Secondary Dressing: Wound #6 Left,Midline Amputation Site - Below Knee: Boardered Foam Dressing Dressing Change Frequency: Wound #6 Left,Midline Amputation Site - Below Knee: Change Dressing Monday,  Wednesday, Friday - Patient will be seen at [facility] in 2 weeks and will change dressing at that appointment. Al other times, HHRN to change dressing Follow-up Appointments: Wound #6 Left,Midline Amputation Site - Below Knee: Return Appointment in 2 weeks. - Patient will be seen at [facility] in 2 weeks and will change dressing at that appointment. Al other times, HHRN to change dressing Off-Loading: Wound #6 Left,Midline Amputation Site - Below Knee: Other: - continue not wearing your prosthesis Home Health: Wound #6 Left,Midline Amputation Site - Below Knee: Continue Home Health Visits - Patient will be seen at [facility] in 2 weeks and will change dressing at that appointment. Al other times, HHRN to change dressing Home Health Nurse may visit PRN to address patient s wound care needs. FACE TO FACE ENCOUNTER: MEDICARE and MEDICAID PATIENTS: I certify that this patient is under my care and that I had a face-to-face encounter that meets the physician face-to-face encounter requirements with this patient on this date. The encounter with the patient was in whole or in part for the following MEDICAL CONDITION: (primary reason for [facility] ([id number]) Healthcare) MEDICAL NECESSITY: I certify, that based on my findings, NURSING services are a medically necessary home health service. HOME BOUND STATUS: I certify that my clinical findings support that this patient is homebound (i.e., Due  to illness or injury, pt requires aid of supportive devices such as crutches, cane, wheelchairs, walkers, the use of special transportation or the assistance of another person to leave their place of residence. There is a normal inability to leave the home and doing so requires considerable and taxing effort. Other absences are for medical reasons / religious services and are infrequent or of short duration when for other reasons). If current dressing causes regression in wound condition, may D/C ordered dressing product/s and apply Normal Saline Moist Dressing daily until next [place] / Other MD appointment. Sharon of regression in wound condition at [phone]. Please direct any NON-WOUND related issues/requests for orders to patient's Primary Care Physician My suggestion is gonna be that we go ahead and initiate/continue with the above wound care measures for the next week. Patient is in agreement that plan. We will see were things stand at follow-up. If anything changes worsens the meantime he'll let me know. Please see above for specific wound care orders. We will see patient for re-evaluation in 2 week(s) here in the clinic. If anything worsens or changes patient will contact our office for additional recommendations. Electronic Signature(s) Signed: 08/05/2018 1:14:32 PM By: Worthy Keeler PA-C Entered By: Worthy Keeler on 08/04/2018 13:55:55 [name], [name] ([id number]) -------------------------------------------------------------------------------- ROS/PFSH Details Patient Name: [name], Victorino N. Date of Service: 08/04/2018 10:45 AM Medical Record Number: [id number] Patient Account Number: [id number] Date of Birth/Sex: 02/04/1956 (62 y.o. M) Treating RN: Montey Hora Primary Care Provider: Tomasa Hose Other Clinician: Referring Provider: Tomasa Hose Treating Provider/Extender: STONE III, Ademola Vert Weeks in Treatment: 5 Information Obtained  From Patient Wound History Do you currently have one or more open woundso Yes How many open wounds do you currently haveo 1 Approximately how long have you had your woundso 3 weeks Has your wound(s) ever healed and then re-openedo No Have you had any lab work done in the past montho Yes Have you tested positive for an antibiotic resistant organism (MRSA, VRE)o Yes Have you tested positive for osteomyelitis (bone infection)o No Have you had any tests for circulation on your legso Yes  Constitutional Symptoms (General Health) Complaints and Symptoms: Negative for: Fever; Chills Eyes Medical History: Negative for: Cataracts; Glaucoma; Optic Neuritis Ear/Nose/Mouth/Throat Medical History: Negative for: Chronic sinus problems/congestion; Middle ear problems Hematologic/Lymphatic Medical History: Positive for: Anemia Negative for: Hemophilia; Human Immunodeficiency Virus; Lymphedema; Sickle Cell Disease Respiratory Complaints and Symptoms: No Complaints or Symptoms Medical History: Positive for: Asthma; Chronic Obstructive Pulmonary Disease (COPD); Sleep Apnea Negative for: Aspiration; Pneumothorax; Tuberculosis Cardiovascular Complaints and Symptoms: No Complaints or Symptoms Medical History: Positive for: Congestive Heart Failure; Coronary Artery Disease; Hypertension; Myocardial Infarction; Peripheral Venous Kawecki, Carla N. ([id number]) Disease Negative for: Angina; Arrhythmia; Deep Vein Thrombosis; Hypotension; Peripheral Arterial Disease; Phlebitis; Vasculitis Gastrointestinal Medical History: Negative for: Cirrhosis ; Colitis; Crohnos; Hepatitis A; Hepatitis B; Hepatitis C Endocrine Medical History: Negative for: Type I Diabetes; Type II Diabetes Genitourinary Medical History: Positive for: End Stage Renal Disease - HD MWF Immunological Medical History: Negative for: Lupus Erythematosus; Raynaudos; Scleroderma Integumentary (Skin) Medical History: Negative for:  History of Burn; History of pressure wounds Musculoskeletal Medical History: Negative for: Gout; Rheumatoid Arthritis; Osteoarthritis; Osteomyelitis Past Medical History Notes: L BKA Neurologic Medical History: Positive for: Neuropathy Negative for: Dementia; Quadriplegia; Paraplegia; Seizure Disorder Oncologic Medical History: Negative for: Received Chemotherapy; Received Radiation Psychiatric Complaints and Symptoms: No Complaints or Symptoms Medical History: Negative for: Anorexia/bulimia; Confinement Anxiety Immunizations Pneumococcal Vaccine: Received Pneumococcal Vaccination: Yes Implantable Devices Hull, Ott N. ([id number]) No devices added Hospitalization / Surgery History Name of Hospital Purpose of Hospitalization/Surgery Date [facility] L [facility] 06/27/2014 Family and Social History Cancer: Yes - Paternal Grandparents; Diabetes: Yes - Father; Heart Disease: Yes - Father; Hereditary Spherocytosis: No; Hypertension: Yes - Father; Kidney Disease: No; Lung Disease: No; Seizures: No; Stroke: No; Thyroid Problems: No; Tuberculosis: No; Current every day smoker; Marital Status - Widowed; Alcohol Use: Never; Drug Use: Prior History; Caffeine Use: Never; Financial Concerns: No; Food, Clothing or Shelter Needs: No; Support System Lacking: No; Transportation Concerns: No; Advanced Directives: No; Patient does not want information on Advanced Directives Physician Affirmation I have reviewed and agree with the above information. Electronic Signature(s) Signed: 08/05/2018 1:14:32 PM By: Worthy Keeler PA-C Signed: 08/05/2018 4:23:17 PM By: Montey Hora Entered By: Worthy Keeler on 08/04/2018 13:55:24 Upton, [name] ([id number]) -------------------------------------------------------------------------------- SuperBill Details Patient Name: Suto, Antoinne N. Date of Service: 08/04/2018 Medical Record Number: [id number] Patient Account Number: [id number] Date of Birth/Sex: [birth year]  (63 y.o. 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) heart failure I73.89 Other specified peripheral vascular diseases I25.119 Atherosclerotic heart disease of native coronary artery with unspecified angina pectoris Facility Procedures CPT4 Code: [id number] Description: 99213 - WOUND CARE VISIT-LEV 3 EST PT Modifier: Quantity: 1 Physician Procedures CPT4 Code Description: [id number] - WC PHYS LEVEL 4 - EST PT ICD-10 Diagnosis Description L98.492 Non-pressure chronic ulcer of skin of other sites with fat la Z89.512 Acquired absence of left leg below knee I50.42 Chronic combined systolic  (congestive) and diastolic (congest P29.51 Other specified peripheral vascular diseases Modifier: yer exposed ive) heart failur Quantity: 1 e Electronic Signature(s) Signed: 08/05/2018 1:14:32 PM By: Worthy Keeler PA-C Entered By: Worthy Keeler on 08/04/2018 13:56:11

## 2018-08-06 NOTE — Progress Notes (Signed)
[name] ([id number]) Visit Report for 08/04/2018 Arrival Information Details Patient Name: [name], [name]. Date of Service: 08/04/2018 10:45 AM Medical Record Number: [id number] Patient Account Number: [id number] Date of Birth/Sex: 09/13/1955 (63 y.o. M) Treating RN: Montey Hora Primary Care Maitri Schnoebelen: Tomasa Hose Other Clinician: Referring Shastina Rua: Tomasa Hose Treating Prabhav Faulkenberry/Extender: Melburn Hake, HOYT Weeks in Treatment: 5 Visit Information History Since Last Visit Added or deleted any medications: No Patient Arrived: Wheel Chair Any new allergies or adverse reactions: No Arrival Time: 10:57 Had a fall or experienced change in No Accompanied By: caregiver Varney Biles activities of daily living that may affect Transfer Assistance: None risk of falls: Patient Identification Verified: Yes Signs or symptoms of abuse/neglect since last visito No Secondary Verification Process Yes Hospitalized since last visit: No Completed: Implantable device outside of the clinic excluding No Patient Has Alerts: Yes cellular tissue based products placed in the center Patient Alerts: Patient on Blood since last visit: Thinner Has Dressing in Place as Prescribed: Yes NOT DIABETIC Pain Present Now: No Eliquis Electronic Signature(s) Signed: 08/04/2018 3:28:00 PM By: Lorine Bears RCP, RRT, CHT Entered By: Becky Sax, Amado Nash on 08/04/2018 10:58:19 Savino, [name] ([id number]) -------------------------------------------------------------------------------- Clinic Level of Care Assessment Details Patient Name: [name], [name] Date of Service: 08/04/2018 10:45 AM Medical Record Number: [id number] Patient Account Number: [id number] Date of Birth/Sex: [birth year] (63 y.o. M) Treating RN: Montey Hora Primary Care Sariyah Corcino: Tomasa Hose Other Clinician: Referring Navid Lenzen: Tomasa Hose Treating Tonie Elsey/Extender: Melburn Hake, HOYT Weeks in Treatment: 5 Clinic  Level of Care Assessment Items TOOL 4 Quantity Score []  - Use when only an EandM is performed on FOLLOW-UP visit 0 ASSESSMENTS - Nursing Assessment / Reassessment X - Reassessment of Co-morbidities (includes updates in patient status) 1 10 X- 1 5 Reassessment of Adherence to Treatment Plan ASSESSMENTS - Wound and Skin Assessment / Reassessment X - Simple Wound Assessment / Reassessment - one wound 1 5 []  - 0 Complex Wound Assessment / Reassessment - multiple wounds []  - 0 Dermatologic / Skin Assessment (not related to wound area) ASSESSMENTS - Focused Assessment []  - Circumferential Edema Measurements - multi extremities 0 []  - 0 Nutritional Assessment / Counseling / Intervention X- 1 5 Lower Extremity Assessment (monofilament, tuning fork, pulses) []  - 0 Peripheral Arterial Disease Assessment (using hand held doppler) ASSESSMENTS - Ostomy and/or Continence Assessment and Care []  - Incontinence Assessment and Management 0 []  - 0 Ostomy Care Assessment and Management (repouching, etc.) PROCESS - Coordination of Care X - Simple Patient / Family Education for ongoing care 1 15 []  - 0 Complex (extensive) Patient / Family Education for ongoing care X- 1 10 Staff obtains Programmer, systems, Records, Test Results / Process Orders []  - 0 Staff telephones HHA, Nursing Homes / Clarify orders / etc []  - 0 Routine Transfer to another Facility (non-emergent condition) []  - 0 Routine Hospital Admission (non-emergent condition) []  - 0 New Admissions / Biomedical engineer / Ordering NPWT, Apligraf, etc. []  - 0 Emergency Hospital Admission (emergent condition) X- 1 10 Simple Discharge Coordination Pense, Makena N. ([id number]) []  - 0 Complex (extensive) Discharge Coordination PROCESS - Special Needs []  - Pediatric / Minor Patient Management 0 []  - 0 Isolation Patient Management []  - 0 Hearing / Language / Visual special needs []  - 0 Assessment of Community assistance (transportation,  D/C planning, etc.) []  - 0 Additional assistance / Altered mentation []  - 0 Support Surface(s) Assessment (bed, cushion, seat, etc.) INTERVENTIONS - Wound Cleansing / Measurement X - Simple Wound  Cleansing - one wound 1 5 []  - 0 Complex Wound Cleansing - multiple wounds X- 1 5 Wound Imaging (photographs - any number of wounds) []  - 0 Wound Tracing (instead of photographs) X- 1 5 Simple Wound Measurement - one wound []  - 0 Complex Wound Measurement - multiple wounds INTERVENTIONS - Wound Dressings X - Small Wound Dressing one or multiple wounds 1 10 []  - 0 Medium Wound Dressing one or multiple wounds []  - 0 Large Wound Dressing one or multiple wounds []  - 0 Application of Medications - topical []  - 0 Application of Medications - injection INTERVENTIONS - Miscellaneous []  - External ear exam 0 []  - 0 Specimen Collection (cultures, biopsies, blood, body fluids, etc.) []  - 0 Specimen(s) / Culture(s) sent or taken to Lab for analysis []  - 0 Patient Transfer (multiple staff / Civil Service fast streamer / Similar devices) []  - 0 Simple Staple / Suture removal (25 or less) []  - 0 Complex Staple / Suture removal (26 or more) []  - 0 Hypo / Hyperglycemic Management (close monitor of Blood Glucose) []  - 0 Ankle / Brachial Index (ABI) - do not check if billed separately X- 1 5 Vital Signs Rudder, Kayce N. ([id number]) Has the patient been seen at the hospital within the last three years: Yes Total Score: 90 Level Of Care: New/Established - Level 3 Electronic Signature(s) Signed: 08/05/2018 4:23:17 PM By: Montey Hora Entered By: Montey Hora on 08/04/2018 11:34:08 Stretch, [name] ([id number]) -------------------------------------------------------------------------------- Encounter Discharge Information Details Patient Name: [name], [name] Date of Service: 08/04/2018 10:45 AM Medical Record Number: [id number] Patient Account Number: [id number] Date of Birth/Sex: Oct 29, 1955 (63 y.o.  M) Treating RN: Montey Hora Primary Care Nephtali Docken: Tomasa Hose Other Clinician: Referring Nello Corro: Tomasa Hose Treating Danta Baumgardner/Extender: Melburn Hake, HOYT Weeks in Treatment: 5 Encounter Discharge Information Items Discharge Condition: Stable Ambulatory Status: Wheelchair Discharge Destination: Home Transportation: Private Auto Accompanied By: caregiver Schedule Follow-up Appointment: Yes Clinical Summary of Care: Electronic Signature(s) Signed: 08/05/2018 4:23:17 PM By: Montey Hora Entered By: Montey Hora on 08/04/2018 11:34:35 Wagman, [name] ([id number]) -------------------------------------------------------------------------------- Lower Extremity Assessment Details Patient Name: Brager, Earmon N. Date of Service: 08/04/2018 10:45 AM Medical Record Number: [id number] Patient Account Number: [id number] Date of Birth/Sex: 04-26-1956 (63 y.o. M) Treating RN: Cornell Barman Primary Care Gillis Boardley: Tomasa Hose Other Clinician: Referring Luara Faye: Tomasa Hose Treating Elleanna Melling/Extender: Melburn Hake, HOYT Weeks in Treatment: 5 Notes BKA LEFT Electronic Signature(s) Signed: 08/04/2018 6:07:40 PM By: Gretta Cool, BSN, RN, CWS, Kim RN, BSN Entered By: Gretta Cool, BSN, RN, CWS, Kim on 08/04/2018 11:08:18 Ruland, [name] ([id number]) -------------------------------------------------------------------------------- Multi Wound Chart Details Patient Name: Stodghill, Velmer N. Date of Service: 08/04/2018 10:45 AM Medical Record Number: [id number] Patient Account Number: [id number] Date of Birth/Sex: 06-27-1955 (63 y.o. M) Treating RN: Montey Hora Primary Care Nichoals Heyde: Tomasa Hose Other Clinician: Referring Nakiea Metzner: Tomasa Hose Treating Sydnie Sigmund/Extender: STONE III, HOYT Weeks in Treatment: 5 Vital Signs Height(in): 74 Pulse(bpm): 71 Weight(lbs): 210 Blood Pressure(mmHg): 115/67 Body Mass Index(BMI): 27 Temperature(F): 97.8 Respiratory Rate 16 (breaths/min): Photos:  [N/A:N/A] Wound Location: Left Amputation Site - Below N/A N/A Knee - Midline Wounding Event: Pressure Injury N/A N/A Primary Etiology: Pressure Ulcer N/A N/A Comorbid History: Anemia, Asthma, Chronic N/A N/A Obstructive Pulmonary Disease (COPD), Sleep Apnea, Congestive Heart Failure, Coronary Artery Disease, Hypertension, Myocardial Infarction, Peripheral Venous Disease, End Stage Renal Disease, Neuropathy Date Acquired: 06/09/2018 N/A N/A Weeks of Treatment: 5 N/A N/A Wound Status: Open N/A N/A Measurements L x W x D 1.2x1x0.2 N/A N/A (cm) Area (  cm) : 0.942 N/A N/A Volume (cm) : 0.188 N/A N/A % Reduction in Area: 89.90% N/A N/A % Reduction in Volume: 79.80% N/A N/A Classification: Category/Stage III N/A N/A Exudate Amount: Medium N/A N/A Exudate Type: Serous N/A N/A Exudate Color: amber N/A N/A Wound Margin: Flat and Intact N/A N/A Granulation Amount: Medium (34-66%) N/A N/A Pyper, Sencere N. ([id number]) Granulation Quality: Pale N/A N/A Necrotic Amount: Medium (34-66%) N/A N/A Exposed Structures: Fat Layer (Subcutaneous N/A N/A Tissue) Exposed: Yes Fascia: No Tendon: No Muscle: No Joint: No Bone: No Epithelialization: Small (1-33%) N/A N/A Periwound Skin Texture: Scarring: Yes N/A N/A Excoriation: No Induration: No Callus: No Crepitus: No Rash: No Periwound Skin Moisture: Maceration: No N/A N/A Dry/Scaly: No Periwound Skin Color: Hemosiderin Staining: Yes N/A N/A Atrophie Blanche: No Cyanosis: No Ecchymosis: No Erythema: No Mottled: No Pallor: No Rubor: No Temperature: No Abnormality N/A N/A Tenderness on Palpation: Yes N/A N/A Wound Preparation: Ulcer Cleansing: N/A N/A Rinsed/Irrigated with Saline Topical Anesthetic Applied: Other: lidocaine 4% Treatment Notes Electronic Signature(s) Signed: 08/05/2018 4:23:17 PM By: Montey Hora Entered By: Montey Hora on 08/04/2018 11:29:30 Sandles, [name]  ([id number]) -------------------------------------------------------------------------------- [place] Details Patient Name: Glanz, Marlowe N. Date of Service: 08/04/2018 10:45 AM Medical Record Number: [id number] Patient Account Number: [id number] Date of Birth/Sex: June 06, 1955 (63 y.o. M) Treating RN: Montey Hora Primary Care Elona Yinger: Tomasa Hose Other Clinician: Referring Jeriyah Granlund: Tomasa Hose Treating Zula Hovsepian/Extender: STONE III, HOYT Weeks in Treatment: 5 Active Inactive Abuse / Safety / Falls / Self Care Management Nursing Diagnoses: Impaired physical mobility Goals: Patient will not develop complications from immobility Date Initiated: 06/30/2018 Target Resolution Date: 09/25/2018 Goal Status: Active Interventions: Assess fall risk on admission and as needed Notes: Nutrition Nursing Diagnoses: Potential for alteratiion in Nutrition/Potential for imbalanced nutrition Goals: Patient/caregiver agrees to and verbalizes understanding of need to use nutritional supplements and/or vitamins as prescribed Date Initiated: 06/30/2018 Target Resolution Date: 09/25/2018 Goal Status: Active Interventions: Assess patient nutrition upon admission and as needed per policy Notes: Orientation to the Wound Care Program Nursing Diagnoses: Knowledge deficit related to the wound healing center program Goals: Patient/caregiver will verbalize understanding of the [place] Program Date Initiated: 06/30/2018 Target Resolution Date: 09/26/2018 Goal Status: Active Interventions: Provide education on orientation to the wound center Sewell, Omarr N. ([id number]) Notes: Wound/Skin Impairment Nursing Diagnoses: Impaired tissue integrity Goals: Ulcer/skin breakdown will heal within 14 weeks Date Initiated: 06/30/2018 Target Resolution Date: 09/25/2018 Goal Status: Active Interventions: Assess patient/caregiver ability to obtain necessary supplies Assess  patient/caregiver ability to perform ulcer/skin care regimen upon admission and as needed Assess ulceration(s) every visit Notes: Electronic Signature(s) Signed: 08/05/2018 4:23:17 PM By: Montey Hora Entered By: Montey Hora on 08/04/2018 11:29:22 Nigg, [name] ([id number]) -------------------------------------------------------------------------------- Pain Assessment Details Patient Name: Crite, Jasher N. Date of Service: 08/04/2018 10:45 AM Medical Record Number: [id number] Patient Account Number: [id number] Date of Birth/Sex: November 24, 1955 (63 y.o. M) Treating RN: Montey Hora Primary Care Lacinda Curvin: Tomasa Hose Other Clinician: Referring Umeka Wrench: Tomasa Hose Treating Travell Desaulniers/Extender: STONE III, HOYT Weeks in Treatment: 5 Active Problems Location of Pain Severity and Description of Pain Patient Has Paino No Site Locations Pain Management and Medication Current Pain Management: Electronic Signature(s) Signed: 08/04/2018 3:28:00 PM By: Paulla Fore, RRT, CHT Signed: 08/05/2018 4:23:17 PM By: Montey Hora Entered By: Lorine Bears on 08/04/2018 10:58:26 Imai, [name] ([id number]) -------------------------------------------------------------------------------- Patient/Caregiver Education Details Patient Name: Acebo, Cosme N. Date of Service: 08/04/2018 10:45 AM Medical Record Number: [id number] Patient Account Number:  [id number] Date of Birth/Gender: Jul 18, 1955 (63 y.o. M) Treating RN: Montey Hora Primary Care Physician: Tomasa Hose Other Clinician: Referring Physician: Tomasa Hose Treating Physician/Extender: Sharalyn Ink in Treatment: 5 Education Assessment Education Provided To: Patient and Caregiver Education Topics Provided Wound/Skin Impairment: Handouts: Other: wound care as ordered Methods: Demonstration, Explain/Verbal Responses: State content correctly Electronic Signature(s) Signed: 08/05/2018  4:23:17 PM By: Montey Hora Entered By: Montey Hora on 08/04/2018 11:34:52 Duca, [name] ([id number]) -------------------------------------------------------------------------------- Wound Assessment Details Patient Name: Tahir, Prosper N. Date of Service: 08/04/2018 10:45 AM Medical Record Number: [id number] Patient Account Number: [id number] Date of Birth/Sex: 11/18/1955 (63 y.o. M) Treating RN: Cornell Barman Primary Care Marelyn Rouser: Tomasa Hose Other Clinician: Referring Nguyet Mercer: Tomasa Hose Treating Leasia Swann/Extender: STONE III, HOYT Weeks in Treatment: 5 Wound Status Wound Number: 6 Primary Pressure Ulcer Etiology: Wound Location: Left Amputation Site - Below Knee - Midline Wound Open Status: Wounding Event: Pressure Injury Comorbid Anemia, Asthma, Chronic Obstructive Pulmonary Date Acquired: 06/09/2018 History: Disease (COPD), Sleep Apnea, Congestive Heart Weeks Of Treatment: 5 Failure, Coronary Artery Disease, Hypertension, Clustered Wound: No Myocardial Infarction, Peripheral Venous Disease, End Stage Renal Disease, Neuropathy Photos Wound Measurements Length: (cm) 1.2 Width: (cm) 1 Depth: (cm) 0.2 Area: (cm) 0.942 Volume: (cm) 0.188 % Reduction in Area: 89.9% % Reduction in Volume: 79.8% Epithelialization: Small (1-33%) Tunneling: No Undermining: No Wound Description Classification: Category/Stage III Wound Margin: Flat and Intact Exudate Amount: Medium Exudate Type: Serous Exudate Color: amber Foul Odor After Cleansing: No Slough/Fibrino No Wound Bed Granulation Amount: Medium (34-66%) Exposed Structure Granulation Quality: Pale Fascia Exposed: No Necrotic Amount: Medium (34-66%) Fat Layer (Subcutaneous Tissue) Exposed: Yes Necrotic Quality: Adherent Slough Tendon Exposed: No Muscle Exposed: No Joint Exposed: No Bone Exposed: No Bieser, Pavle N. ([id number]) Periwound Skin Texture Texture Color No Abnormalities Noted: No No Abnormalities  Noted: No Callus: No Atrophie Blanche: No Crepitus: No Cyanosis: No Excoriation: No Ecchymosis: No Induration: No Erythema: No Rash: No Hemosiderin Staining: Yes Scarring: Yes Mottled: No Pallor: No Moisture Rubor: No No Abnormalities Noted: Yes Temperature / Pain Temperature: No Abnormality Tenderness on Palpation: Yes Wound Preparation Ulcer Cleansing: Rinsed/Irrigated with Saline Topical Anesthetic Applied: Other: lidocaine 4%, Treatment Notes Wound #6 (Left, Midline Amputation Site - Below Knee) Notes silver cell, bordered foam dressing Electronic Signature(s) Signed: 08/04/2018 6:07:40 PM By: Gretta Cool, BSN, RN, CWS, Kim RN, BSN Entered By: Gretta Cool, BSN, RN, CWS, Kim on 08/04/2018 11:07:50 Apollo, [name] ([id number]) -------------------------------------------------------------------------------- [place] Details Patient Name: Meister, Kiowa N. Date of Service: 08/04/2018 10:45 AM Medical Record Number: [id number] Patient Account Number: [id number] Date of Birth/Sex: 1955-12-11 (63 y.o. M) Treating RN: Montey Hora Primary Care Milia Warth: Tomasa Hose Other Clinician: Referring Natiya Seelinger: Tomasa Hose Treating Teah Votaw/Extender: STONE III, HOYT Weeks in Treatment: 5 Vital Signs Time Taken: 10:58 Temperature (F): 97.8 Height (in): 74 Pulse (bpm): 71 Weight (lbs): 210 Respiratory Rate (breaths/min): 16 Body Mass Index (BMI): 27 Blood Pressure (mmHg): 115/67 Reference Range: 80 - 120 mg / dl Electronic Signature(s) Signed: 08/04/2018 3:28:00 PM By: Lorine Bears RCP, RRT, CHT Entered By: Lorine Bears on 08/04/2018 11:01:44

## 2018-08-10 ENCOUNTER — Emergency Department: Payer: Medicare Other

## 2018-08-10 ENCOUNTER — Encounter: Payer: Self-pay | Admitting: Pulmonary Disease

## 2018-08-10 ENCOUNTER — Encounter: Payer: Self-pay | Admitting: Emergency Medicine

## 2018-08-10 ENCOUNTER — Other Ambulatory Visit: Payer: Self-pay

## 2018-08-10 ENCOUNTER — Inpatient Hospital Stay
Admission: EM | Admit: 2018-08-10 | Discharge: 2018-08-18 | DRG: 280 | Disposition: A | Discharge status: Skilled Nursing Facility | Payer: Medicare Other | Source: Skilled Nursing Facility | Attending: Internal Medicine | Admitting: Internal Medicine

## 2018-08-10 ENCOUNTER — Inpatient Hospital Stay
Admit: 2018-08-10 | Discharge: 2018-08-10 | Disposition: A | Discharge status: Home / Self Care | Payer: Medicare Other | Attending: Family Medicine | Admitting: Family Medicine

## 2018-08-10 DIAGNOSIS — Z7982 Long term (current) use of aspirin: Secondary | ICD-10-CM

## 2018-08-10 DIAGNOSIS — I447 Left bundle-branch block, unspecified: Secondary | ICD-10-CM | POA: Diagnosis present

## 2018-08-10 DIAGNOSIS — Z806 Family history of leukemia: Secondary | ICD-10-CM

## 2018-08-10 DIAGNOSIS — D631 Anemia in chronic kidney disease: Secondary | ICD-10-CM | POA: Diagnosis present

## 2018-08-10 DIAGNOSIS — I132 Hypertensive heart and chronic kidney disease with heart failure and with stage 5 chronic kidney disease, or end stage renal disease: Secondary | ICD-10-CM | POA: Diagnosis present

## 2018-08-10 DIAGNOSIS — J9601 Acute respiratory failure with hypoxia: Secondary | ICD-10-CM

## 2018-08-10 DIAGNOSIS — Z89421 Acquired absence of other right toe(s): Secondary | ICD-10-CM

## 2018-08-10 DIAGNOSIS — K922 Gastrointestinal hemorrhage, unspecified: Secondary | ICD-10-CM | POA: Diagnosis not present

## 2018-08-10 DIAGNOSIS — F17210 Nicotine dependence, cigarettes, uncomplicated: Secondary | ICD-10-CM | POA: Diagnosis present

## 2018-08-10 DIAGNOSIS — E1122 Type 2 diabetes mellitus with diabetic chronic kidney disease: Secondary | ICD-10-CM | POA: Diagnosis present

## 2018-08-10 DIAGNOSIS — Z87442 Personal history of urinary calculi: Secondary | ICD-10-CM

## 2018-08-10 DIAGNOSIS — Z86718 Personal history of other venous thrombosis and embolism: Secondary | ICD-10-CM

## 2018-08-10 DIAGNOSIS — I340 Nonrheumatic mitral (valve) insufficiency: Secondary | ICD-10-CM | POA: Diagnosis present

## 2018-08-10 DIAGNOSIS — D62 Acute posthemorrhagic anemia: Secondary | ICD-10-CM | POA: Diagnosis present

## 2018-08-10 DIAGNOSIS — E782 Mixed hyperlipidemia: Secondary | ICD-10-CM | POA: Diagnosis present

## 2018-08-10 DIAGNOSIS — Z89512 Acquired absence of left leg below knee: Secondary | ICD-10-CM

## 2018-08-10 DIAGNOSIS — I214 Non-ST elevation (NSTEMI) myocardial infarction: Secondary | ICD-10-CM | POA: Diagnosis present

## 2018-08-10 DIAGNOSIS — E1151 Type 2 diabetes mellitus with diabetic peripheral angiopathy without gangrene: Secondary | ICD-10-CM | POA: Diagnosis present

## 2018-08-10 DIAGNOSIS — Z89411 Acquired absence of right great toe: Secondary | ICD-10-CM

## 2018-08-10 DIAGNOSIS — N2581 Secondary hyperparathyroidism of renal origin: Secondary | ICD-10-CM | POA: Diagnosis present

## 2018-08-10 DIAGNOSIS — Z955 Presence of coronary angioplasty implant and graft: Secondary | ICD-10-CM | POA: Diagnosis not present

## 2018-08-10 DIAGNOSIS — R079 Chest pain, unspecified: Secondary | ICD-10-CM | POA: Diagnosis not present

## 2018-08-10 DIAGNOSIS — Z7951 Long term (current) use of inhaled steroids: Secondary | ICD-10-CM

## 2018-08-10 DIAGNOSIS — J449 Chronic obstructive pulmonary disease, unspecified: Secondary | ICD-10-CM | POA: Diagnosis present

## 2018-08-10 DIAGNOSIS — N186 End stage renal disease: Secondary | ICD-10-CM | POA: Diagnosis present

## 2018-08-10 DIAGNOSIS — G4730 Sleep apnea, unspecified: Secondary | ICD-10-CM | POA: Diagnosis present

## 2018-08-10 DIAGNOSIS — I9589 Other hypotension: Secondary | ICD-10-CM | POA: Diagnosis present

## 2018-08-10 DIAGNOSIS — I429 Cardiomyopathy, unspecified: Secondary | ICD-10-CM | POA: Diagnosis present

## 2018-08-10 DIAGNOSIS — Z7901 Long term (current) use of anticoagulants: Secondary | ICD-10-CM

## 2018-08-10 DIAGNOSIS — I501 Left ventricular failure: Secondary | ICD-10-CM

## 2018-08-10 DIAGNOSIS — Z79899 Other long term (current) drug therapy: Secondary | ICD-10-CM

## 2018-08-10 DIAGNOSIS — Z86711 Personal history of pulmonary embolism: Secondary | ICD-10-CM

## 2018-08-10 DIAGNOSIS — I25119 Atherosclerotic heart disease of native coronary artery with unspecified angina pectoris: Secondary | ICD-10-CM | POA: Diagnosis present

## 2018-08-10 DIAGNOSIS — R0789 Other chest pain: Secondary | ICD-10-CM | POA: Diagnosis present

## 2018-08-10 DIAGNOSIS — Z79891 Long term (current) use of opiate analgesic: Secondary | ICD-10-CM

## 2018-08-10 DIAGNOSIS — J9621 Acute and chronic respiratory failure with hypoxia: Secondary | ICD-10-CM | POA: Diagnosis present

## 2018-08-10 DIAGNOSIS — Z833 Family history of diabetes mellitus: Secondary | ICD-10-CM

## 2018-08-10 DIAGNOSIS — I5023 Acute on chronic systolic (congestive) heart failure: Secondary | ICD-10-CM | POA: Diagnosis present

## 2018-08-10 DIAGNOSIS — K921 Melena: Secondary | ICD-10-CM | POA: Diagnosis present

## 2018-08-10 DIAGNOSIS — K219 Gastro-esophageal reflux disease without esophagitis: Secondary | ICD-10-CM | POA: Diagnosis present

## 2018-08-10 DIAGNOSIS — I252 Old myocardial infarction: Secondary | ICD-10-CM

## 2018-08-10 DIAGNOSIS — Z8249 Family history of ischemic heart disease and other diseases of the circulatory system: Secondary | ICD-10-CM

## 2018-08-10 DIAGNOSIS — D649 Anemia, unspecified: Secondary | ICD-10-CM

## 2018-08-10 DIAGNOSIS — Z86010 Personal history of colonic polyps: Secondary | ICD-10-CM

## 2018-08-10 DIAGNOSIS — Z9119 Patient's noncompliance with other medical treatment and regimen: Secondary | ICD-10-CM

## 2018-08-10 DIAGNOSIS — Z992 Dependence on renal dialysis: Secondary | ICD-10-CM | POA: Diagnosis not present

## 2018-08-10 LAB — COMPREHENSIVE METABOLIC PANEL
ALT: 29 U/L (ref 0–44)
AST: 35 U/L (ref 15–41)
Albumin: 3 g/dL — ABNORMAL LOW (ref 3.5–5.0)
Alkaline Phosphatase: 82 U/L (ref 38–126)
Anion gap: 14 (ref 5–15)
BUN: 67 mg/dL — ABNORMAL HIGH (ref 8–23)
CO2: 21 mmol/L — AB (ref 22–32)
Calcium: 9.1 mg/dL (ref 8.9–10.3)
Chloride: 103 mmol/L (ref 98–111)
Creatinine, Ser: 10.25 mg/dL — ABNORMAL HIGH (ref 0.61–1.24)
GFR calc non Af Amer: 5 mL/min — ABNORMAL LOW (ref >60)
GFR, EST AFRICAN AMERICAN: 6 mL/min — AB (ref >60)
Glucose, Bld: 131 mg/dL — ABNORMAL HIGH (ref 70–99)
Potassium: 4.1 mmol/L (ref 3.5–5.1)
SODIUM: 138 mmol/L (ref 135–145)
Total Bilirubin: 0.3 mg/dL (ref 0.3–1.2)
Total Protein: 6.6 g/dL (ref 6.5–8.1)

## 2018-08-10 LAB — APTT
aPTT: 36 seconds (ref 24–36)
aPTT: 102 seconds — ABNORMAL HIGH (ref 24–36)
aPTT: 98 seconds — ABNORMAL HIGH (ref 24–36)

## 2018-08-10 LAB — CBC WITH DIFFERENTIAL/PLATELET
Abs Immature Granulocytes: 0.05 10*3/uL (ref 0.00–0.07)
Basophils Absolute: 0 10*3/uL (ref 0.0–0.1)
Basophils Relative: 0 %
Eosinophils Absolute: 0.2 10*3/uL (ref 0.0–0.5)
Eosinophils Relative: 3 %
HCT: 24.2 % — ABNORMAL LOW (ref 39.0–52.0)
Hemoglobin: 7.6 g/dL — ABNORMAL LOW (ref 13.0–17.0)
Immature Granulocytes: 1 %
Lymphocytes Relative: 22 %
Lymphs Abs: 2 10*3/uL (ref 0.7–4.0)
MCH: 31.5 pg (ref 26.0–34.0)
MCHC: 31.4 g/dL (ref 30.0–36.0)
MCV: 100.4 fL — ABNORMAL HIGH (ref 80.0–100.0)
Monocytes Absolute: 1.1 10*3/uL — ABNORMAL HIGH (ref 0.1–1.0)
Monocytes Relative: 12 %
Neutro Abs: 5.5 10*3/uL (ref 1.7–7.7)
Neutrophils Relative %: 62 %
Platelets: 209 10*3/uL (ref 150–400)
RBC: 2.41 MIL/uL — ABNORMAL LOW (ref 4.22–5.81)
RDW: 15.6 % — ABNORMAL HIGH (ref 11.5–15.5)
WBC: 8.9 10*3/uL (ref 4.0–10.5)
nRBC: 0.5 % — ABNORMAL HIGH (ref 0.0–0.2)

## 2018-08-10 LAB — PREPARE RBC (CROSSMATCH)

## 2018-08-10 LAB — HEPARIN LEVEL (UNFRACTIONATED): Heparin Unfractionated: >3.6 IU/mL — ABNORMAL HIGH (ref 0.30–0.70)

## 2018-08-10 LAB — LIPASE, BLOOD: Lipase: 86 U/L — ABNORMAL HIGH (ref 11–51)

## 2018-08-10 LAB — GLUCOSE, CAPILLARY: GLUCOSE-CAPILLARY: 104 mg/dL — AB (ref 70–99)

## 2018-08-10 LAB — PROTIME-INR
INR: 1.3 — ABNORMAL HIGH (ref 0.8–1.2)
Prothrombin Time: 15.9 seconds — ABNORMAL HIGH (ref 11.4–15.2)

## 2018-08-10 LAB — ECHOCARDIOGRAM COMPLETE
Height: 68 in
Weight: 3354.52 oz

## 2018-08-10 LAB — PHOSPHORUS: Phosphorus: 5.2 mg/dL — ABNORMAL HIGH (ref 2.5–4.6)

## 2018-08-10 LAB — TROPONIN I: Troponin I: 1.89 ng/mL (ref <0.03)

## 2018-08-10 MED ORDER — NITROGLYCERIN IN D5W 200-5 MCG/ML-% IV SOLN: 0.0000 ug/min | INTRAVENOUS | Status: DC

## 2018-08-10 MED ORDER — LORATADINE 10 MG PO TABS
10.0000 mg | ORAL_TABLET | Freq: Every day | ORAL | Status: DC
  Administered 2018-08-10 – 2018-08-18 (×8): 10 mg via ORAL
  Filled 2018-08-10 (×9): qty 1

## 2018-08-10 MED ORDER — MIDODRINE HCL 5 MG PO TABS
10.0000 mg | ORAL_TABLET | Freq: Three times a day (TID) | ORAL | Status: DC
  Administered 2018-08-10 – 2018-08-18 (×20): 10 mg via ORAL
  Filled 2018-08-10 (×29): qty 2

## 2018-08-10 MED ORDER — ACETAMINOPHEN 325 MG PO TABS: 650.0000 mg | ORAL_TABLET | ORAL | Status: DC | PRN

## 2018-08-10 MED ORDER — TIOTROPIUM BROMIDE MONOHYDRATE 18 MCG IN CAPS
18.0000 ug | ORAL_CAPSULE | Freq: Every day | RESPIRATORY_TRACT | Status: DC
  Administered 2018-08-10 – 2018-08-16 (×7): 18 ug via RESPIRATORY_TRACT
  Filled 2018-08-10: qty 5

## 2018-08-10 MED ORDER — SODIUM CHLORIDE 0.9 % IV SOLN
10.0000 mL/h | Freq: Once | INTRAVENOUS | Status: AC
  Administered 2018-08-10: 10 mL/h via INTRAVENOUS

## 2018-08-10 MED ORDER — ASPIRIN EC 81 MG PO TBEC
81.0000 mg | DELAYED_RELEASE_TABLET | Freq: Every day | ORAL | Status: DC
  Administered 2018-08-11 – 2018-08-18 (×8): 81 mg via ORAL
  Filled 2018-08-10 (×8): qty 1

## 2018-08-10 MED ORDER — HEPARIN BOLUS VIA INFUSION: 2000.0000 [IU] | Freq: Once | INTRAVENOUS | Status: DC

## 2018-08-10 MED ORDER — ALBUTEROL SULFATE (2.5 MG/3ML) 0.083% IN NEBU: 2.5000 mg | INHALATION_SOLUTION | Freq: Four times a day (QID) | RESPIRATORY_TRACT | Status: DC | PRN

## 2018-08-10 MED ORDER — FLUTICASONE PROPIONATE 50 MCG/ACT NA SUSP
1.0000 | Freq: Every day | NASAL | Status: DC
  Administered 2018-08-11 – 2018-08-13 (×2): 1 via NASAL
  Filled 2018-08-10 (×2): qty 16

## 2018-08-10 MED ORDER — CHLORHEXIDINE GLUCONATE CLOTH 2 % EX PADS
6.0000 | MEDICATED_PAD | Freq: Every day | CUTANEOUS | Status: DC
  Administered 2018-08-10 – 2018-08-18 (×7): 6 via TOPICAL

## 2018-08-10 MED ORDER — MUPIROCIN 2 % EX OINT
1.0000 "application " | TOPICAL_OINTMENT | Freq: Two times a day (BID) | CUTANEOUS | Status: AC
  Administered 2018-08-10 – 2018-08-15 (×9): 1 via NASAL
  Filled 2018-08-10: qty 22

## 2018-08-10 MED ORDER — ORAL CARE MOUTH RINSE
15.0000 mL | Freq: Two times a day (BID) | OROMUCOSAL | Status: DC
  Administered 2018-08-11 – 2018-08-18 (×6): 15 mL via OROMUCOSAL

## 2018-08-10 MED ORDER — TRAMADOL HCL 50 MG PO TABS
75.0000 mg | ORAL_TABLET | Freq: Three times a day (TID) | ORAL | Status: DC | PRN
  Administered 2018-08-11: 75 mg via ORAL
  Filled 2018-08-10: qty 2

## 2018-08-10 MED ORDER — RENA-VITE PO TABS
1.0000 | ORAL_TABLET | Freq: Every day | ORAL | Status: DC
  Administered 2018-08-10 – 2018-08-17 (×7): 1 via ORAL
  Filled 2018-08-10 (×9): qty 1

## 2018-08-10 MED ORDER — SODIUM CHLORIDE 0.9 % IV SOLN
8.0000 mg/h | INTRAVENOUS | Status: AC
  Administered 2018-08-10 – 2018-08-13 (×5): 8 mg/h via INTRAVENOUS
  Filled 2018-08-10 (×8): qty 80

## 2018-08-10 MED ORDER — NOREPINEPHRINE BITARTRATE 1 MG/ML IV SOLN
0.0000 ug/min | INTRAVENOUS | Status: DC
  Filled 2018-08-10: qty 4

## 2018-08-10 MED ORDER — DOCUSATE SODIUM 100 MG PO CAPS
100.0000 mg | ORAL_CAPSULE | Freq: Every day | ORAL | Status: DC | PRN
  Filled 2018-08-10: qty 1

## 2018-08-10 MED ORDER — ATORVASTATIN CALCIUM 20 MG PO TABS: 10.0000 mg | ORAL_TABLET | Freq: Every day | ORAL | Status: DC

## 2018-08-10 MED ORDER — ONDANSETRON HCL 4 MG/2ML IJ SOLN: 4.0000 mg | Freq: Four times a day (QID) | INTRAMUSCULAR | Status: DC | PRN

## 2018-08-10 MED ORDER — ZOLPIDEM TARTRATE 5 MG PO TABS
5.0000 mg | ORAL_TABLET | Freq: Every evening | ORAL | Status: DC | PRN
  Administered 2018-08-11: 5 mg via ORAL
  Filled 2018-08-10: qty 1

## 2018-08-10 MED ORDER — ISOSORBIDE MONONITRATE ER 30 MG PO TB24
30.0000 mg | ORAL_TABLET | Freq: Every day | ORAL | Status: DC
  Administered 2018-08-10 – 2018-08-15 (×4): 30 mg via ORAL
  Filled 2018-08-10 (×6): qty 1

## 2018-08-10 MED ORDER — SEVELAMER CARBONATE 800 MG PO TABS: 800.0000 mg | ORAL_TABLET | Freq: Three times a day (TID) | ORAL | Status: DC

## 2018-08-10 MED ORDER — MORPHINE SULFATE (PF) 4 MG/ML IV SOLN
4.0000 mg | Freq: Once | INTRAVENOUS | Status: AC
  Administered 2018-08-10: 4 mg via INTRAVENOUS
  Filled 2018-08-10: qty 1

## 2018-08-10 MED ORDER — SODIUM CHLORIDE 0.9 % IV SOLN: INTRAVENOUS | Status: DC

## 2018-08-10 MED ORDER — CALCIUM ACETATE (PHOS BINDER) 667 MG PO CAPS
667.0000 mg | ORAL_CAPSULE | Freq: Every day | ORAL | Status: DC
  Filled 2018-08-10: qty 1

## 2018-08-10 MED ORDER — ONDANSETRON HCL 4 MG/2ML IJ SOLN
4.0000 mg | Freq: Once | INTRAMUSCULAR | Status: AC
  Administered 2018-08-10: 4 mg via INTRAVENOUS
  Filled 2018-08-10: qty 2

## 2018-08-10 MED ORDER — HEPARIN (PORCINE) 25000 UT/250ML-% IV SOLN
950.0000 [IU]/h | INTRAVENOUS | Status: DC
  Administered 2018-08-10 – 2018-08-11 (×2): 1000 [IU]/h via INTRAVENOUS
  Administered 2018-08-13: 950 [IU]/h via INTRAVENOUS
  Filled 2018-08-10 (×4): qty 250

## 2018-08-10 MED ORDER — PANTOPRAZOLE SODIUM 20 MG PO TBEC: 20.0000 mg | DELAYED_RELEASE_TABLET | Freq: Every day | ORAL | Status: DC

## 2018-08-10 MED ORDER — ADULT MULTIVITAMIN W/MINERALS CH
1.0000 | ORAL_TABLET | Freq: Two times a day (BID) | ORAL | Status: DC
  Administered 2018-08-10: 1 via ORAL
  Filled 2018-08-10: qty 1

## 2018-08-10 MED ORDER — VITAMIN C 500 MG PO TABS
500.0000 mg | ORAL_TABLET | Freq: Two times a day (BID) | ORAL | Status: DC
  Administered 2018-08-10 – 2018-08-18 (×15): 500 mg via ORAL
  Filled 2018-08-10 (×18): qty 1

## 2018-08-10 MED ORDER — APIXABAN 5 MG PO TABS: 5.0000 mg | ORAL_TABLET | Freq: Two times a day (BID) | ORAL | Status: DC

## 2018-08-10 MED ORDER — CHLORHEXIDINE GLUCONATE CLOTH 2 % EX PADS
6.0000 | MEDICATED_PAD | Freq: Every day | CUTANEOUS | Status: AC
  Administered 2018-08-12 – 2018-08-13 (×2): 6 via TOPICAL

## 2018-08-10 MED ORDER — SODIUM CHLORIDE 0.9 % IV SOLN
80.0000 mg | Freq: Once | INTRAVENOUS | Status: AC
  Administered 2018-08-10: 80 mg via INTRAVENOUS
  Filled 2018-08-10: qty 80

## 2018-08-10 MED ORDER — POLYETHYLENE GLYCOL 3350 17 G PO PACK: 17.0000 g | PACK | Freq: Every day | ORAL | Status: DC | PRN

## 2018-08-10 MED ORDER — GUAIFENESIN 100 MG/5ML PO SOLN
200.0000 mg | Freq: Three times a day (TID) | ORAL | Status: DC | PRN
  Filled 2018-08-10: qty 10

## 2018-08-10 MED ORDER — ATORVASTATIN CALCIUM 20 MG PO TABS
80.0000 mg | ORAL_TABLET | Freq: Every day | ORAL | Status: DC
  Administered 2018-08-11 – 2018-08-17 (×7): 80 mg via ORAL
  Filled 2018-08-10 (×6): qty 4

## 2018-08-10 MED ORDER — PREGABALIN 75 MG PO CAPS
75.0000 mg | ORAL_CAPSULE | Freq: Every day | ORAL | Status: DC
  Administered 2018-08-10 – 2018-08-18 (×8): 75 mg via ORAL
  Filled 2018-08-10 (×9): qty 1

## 2018-08-10 MED ORDER — ERYTHROMYCIN 5 MG/GM OP OINT
1.0000 "application " | TOPICAL_OINTMENT | Freq: Every day | OPHTHALMIC | Status: DC
  Filled 2018-08-10: qty 1

## 2018-08-10 MED ORDER — HYDROMORPHONE HCL 1 MG/ML IJ SOLN
1.0000 mg | Freq: Once | INTRAMUSCULAR | Status: AC
  Administered 2018-08-10: 1 mg via INTRAVENOUS
  Filled 2018-08-10: qty 1

## 2018-08-10 MED ORDER — NITROGLYCERIN IN D5W 200-5 MCG/ML-% IV SOLN
0.0000 ug/min | INTRAVENOUS | Status: DC
  Administered 2018-08-10: 20 ug/min via INTRAVENOUS
  Filled 2018-08-10: qty 250

## 2018-08-10 MED ORDER — CARVEDILOL 6.25 MG PO TABS
6.2500 mg | ORAL_TABLET | Freq: Two times a day (BID) | ORAL | Status: DC
  Administered 2018-08-10 – 2018-08-16 (×7): 6.25 mg via ORAL
  Filled 2018-08-10 (×9): qty 1

## 2018-08-10 MED ORDER — IPRATROPIUM-ALBUTEROL 0.5-2.5 (3) MG/3ML IN SOLN: 3.0000 mL | Freq: Four times a day (QID) | RESPIRATORY_TRACT | Status: DC | PRN

## 2018-08-10 NOTE — Progress Notes (Signed)
*  PRELIMINARY RESULTS* Echocardiogram 2D Echocardiogram has been performed.  [place] 08/10/2018, 2:52 PM

## 2018-08-10 NOTE — Progress Notes (Signed)
[facility]  ROUNDING NOTE   Subjective:  Patient seen at bedside Last dialysis on Friday. Came in with midsternal chest pain. Found to be hypotensive upon admission. He was started on heparin drip. Patient also found to be Hemoccult positive.   Objective:  Vital signs in last 24 hours:  Temp:  [97.5 F (36.4 C)-97.9 F (36.6 C)] 97.9 F (36.6 C) (03/16 1000) Pulse Rate:  [73-88] 73 (03/16 1000) Resp:  [8-19] 14 (03/16 1000) BP: (101-120)/(63-84) 107/76 (03/16 1000) SpO2:  [92 %-100 %] 98 % (03/16 1000) Weight:  [95.1 kg-95.3 kg] 95.1 kg (03/16 0650)  Weight change:  Filed Weights   08/10/18 0238 08/10/18 0650  Weight: 95.3 kg 95.1 kg    Intake/Output: I/O last 3 completed shifts: In: 51 [I.V.:10; IV Piggyback:25] Out: -    Intake/Output this shift:  Total I/O In: 320 [Blood:320] Out: -   Physical Exam: General: No acute distress  Head: Normocephalic, atraumatic. Moist oral mucosal membranes  Eyes: Anicteric  Neck: Supple, trachea midline  Lungs:  Clear to auscultation, normal effort  Heart: S1S2 no rubs  Abdomen:  Soft, nontender, bowel sounds present  Extremities: traceperipheral edema.  Neurologic: Awake, alert, following commands  Skin: No lesions  Access: LUE AVF    Basic Metabolic Panel: Recent Labs  Lab 08/04/18 1305 08/10/18 0243  NA 141 138  K 3.7 4.1  CL 100 103  CO2 29 21*  GLUCOSE 98 131*  BUN 56* 67*  CREATININE 8.38* 10.25*  CALCIUM 9.6 9.1  PHOS  --  5.2*    Liver Function Tests: Recent Labs  Lab 08/10/18 0243  AST 35  ALT 29  ALKPHOS 82  BILITOT 0.3  PROT 6.6  ALBUMIN 3.0*   Recent Labs  Lab 08/10/18 0243  LIPASE 86*   No results for input(s): AMMONIA in the last 168 hours.  CBC: Recent Labs  Lab 08/10/18 0243  WBC 8.9  NEUTROABS 5.5  HGB 7.6*  HCT 24.2*  MCV 100.4*  PLT 209    Cardiac Enzymes: Recent Labs  Lab 08/04/18 1305 08/10/18 0243  CKTOTAL 145  --   CKMB 5.2*  --    TROPONINI 0.22* 1.89*    BNP: Invalid input(s): POCBNP  CBG: No results for input(s): GLUCAP in the last 168 hours.  Microbiology: Results for orders placed or performed during the hospital encounter of 08/10/18  MRSA PCR Screening     Status: Abnormal   Collection Time: 08/10/18  6:54 AM  Result Value Ref Range Status   MRSA by PCR POSITIVE (A) NEGATIVE Final    Comment:        The GeneXpert MRSA Assay (FDA approved for NASAL specimens only), is one component of a comprehensive MRSA colonization surveillance program. It is not intended to diagnose MRSA infection nor to guide or monitor treatment for MRSA infections. Performed at [facility], [address] [place], [place] [address]     Coagulation Studies: Recent Labs    08/10/18 0415  LABPROT 15.9*  INR 1.3*    Urinalysis: No results for input(s): COLORURINE, LABSPEC, PHURINE, GLUCOSEU, HGBUR, BILIRUBINUR, KETONESUR, PROTEINUR, UROBILINOGEN, NITRITE, LEUKOCYTESUR in the last 72 hours.  Invalid input(s): APPERANCEUR    Imaging: Dg Chest Portable 1 View  Result Date: 08/10/2018 CLINICAL DATA:  Chest pain EXAM: PORTABLE CHEST 1 VIEW COMPARISON:  04/24/2018 FINDINGS: Cardiomegaly. Bilateral airspace disease with small bilateral effusions. Findings likely reflect edema/CHF. No acute bony abnormality. IMPRESSION: Cardiomegaly with bilateral effusions and diffuse  bilateral airspace disease, likely edema/CHF. Electronically Signed   By: Rolm Baptise M.D.   On: 08/10/2018 03:09     Medications:   . sodium chloride    . heparin 1,000 Units/hr (08/10/18 0700)  . nitroGLYCERIN    . norepinephrine (LEVOPHED) Adult infusion    . pantoprozole (PROTONIX) infusion 8 mg/hr (08/10/18 0503)   . [START ON 08/11/2018] aspirin EC  81 mg Oral Daily  . atorvastatin  80 mg Oral q1800  . carvedilol  6.25 mg Oral BID  . Chlorhexidine Gluconate Cloth  6 each Topical Q0600  . Chlorhexidine Gluconate Cloth  6 each  Topical Q0600  . erythromycin  1 application Right Eye QHS  . fluticasone  1 spray Each Nare Daily  . isosorbide mononitrate  30 mg Oral Daily  . loratadine  10 mg Oral Daily  . midodrine  10 mg Oral Q8H  . multivitamin with minerals  1 tablet Oral BID  . mupirocin ointment  1 application Nasal BID  . pregabalin  75 mg Oral Daily  . tiotropium  18 mcg Inhalation Daily   acetaminophen, albuterol, docusate sodium, guaiFENesin, ipratropium-albuterol, ondansetron (ZOFRAN) IV, polyethylene glycol, traMADol, zolpidem  Assessment/ Plan:  63 y.o. male with ESRD on hemodialysis, hypertension, hyperlipidemia, anemia, COPD, peripheral vascular disease, left BKA, right toe amputations  MWF [facility] Nephrology [facility].   1.  End-stage renal disease 2.  Chest pain. 3.  Anemia of chronic kidney disease 4.  Chronic hypotension 5.  Secondary hyperparathyroidism  Plan: Patient seen and evaluated at bedside in the critical care unit.  He was admitted with chest pain.  He is due for his normal dialysis treatment today.  Orders have been prepared.  He is currently maintained on heparin drip.  He was also found to be Hemoccult positive and gastroenterology will be consulted on him.  Ultrafiltration target set for 1.5 kg.   LOS: 0 [name] 3/16/202010:18 AM

## 2018-08-10 NOTE — Progress Notes (Signed)
Pre HD Assessment    08/10/18 1515  Neurological  Level of Consciousness Alert  Orientation Level Oriented X4  Respiratory  Respiratory Pattern Regular;Unlabored  Chest Assessment Chest expansion symmetrical  Bilateral Breath Sounds Diminished  Cough Non-productive  Cardiac  Pulse Regular  Heart Sounds S1, S2  ECG Monitor Yes  Cardiac Rhythm NSR  Vascular  R Radial Pulse +2  L Radial Pulse +2  Edema Generalized  Generalized Edema +1  Psychosocial  Psychosocial (WDL) WDL

## 2018-08-10 NOTE — Progress Notes (Signed)
Melbeta for heparin Indication: chest pain/ACS  Allergies  Allergen Reactions  . Dust Mite Extract Other (See Comments)    Reaction: unknown    Patient Measurements: Height: 5\' 8"  (172.7 cm) Weight: 209 lb 10.5 oz (95.1 kg) IBW/kg (Calculated) : 68.4 Heparin Dosing Weight: 90.4 kg  Vital Signs: Temp: 97.6 F (36.4 C) (03/16 1707) Temp Source: Oral (03/16 1707) BP: 105/67 (03/16 1707) Pulse Rate: 78 (03/16 1707)  Labs: Recent Labs    08/10/18 0243 08/10/18 0415 08/10/18 1340  HGB 7.6*  --   --   HCT 24.2*  --   --   PLT 209  --   --   APTT  --  36 102*  LABPROT  --  15.9*  --   INR  --  1.3*  --   HEPARINUNFRC  --  >3.60*  --   CREATININE 10.25*  --   --   TROPONINI 1.89*  --   --     Estimated Creatinine Clearance: 8.1 mL/min (A) (by C-G formula based on SCr of 10.25 mg/dL (H)).   Medical History: Past Medical History:  Diagnosis Date  . 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 Amputee, below knee, left ([place])   . Anemia   . Asthma   . Cardiomyopathy ([place])   . CHF (congestive heart failure) ([place])   . Chronic systolic heart failure ([place])   . Complication of anesthesia    hypotension  . COPD (chronic obstructive pulmonary disease) ([place])   . Coronary artery disease   . Dialysis patient ([place])    Mon, Wed, Fri  . End stage renal disease ([place])   . GERD (gastroesophageal reflux disease)   . Headache   . History of kidney stones   . History of pulmonary embolism   . HLD (hyperlipidemia)   . HTN (hypertension)   . Hyperparathyroidism   . Myocardial infarction ([place])   . Peripheral vascular disease ([place])   . Shortness of breath dyspnea   . Sleep apnea    NO C-PAP, Patient stated in process of  "getting one"   . Tobacco dependence     Medications:  Scheduled:  . [START ON 08/11/2018] aspirin EC  81 mg Oral Daily  . atorvastatin  80 mg Oral q1800  . carvedilol  6.25 mg Oral BID  . Chlorhexidine  Gluconate Cloth  6 each Topical Q0600  . Chlorhexidine Gluconate Cloth  6 each Topical Q0600  . erythromycin  1 application Right Eye QHS  . fluticasone  1 spray Each Nare Daily  . isosorbide mononitrate  30 mg Oral Daily  . loratadine  10 mg Oral Daily  . mouth rinse  15 mL Mouth Rinse BID  . midodrine  10 mg Oral Q8H  . multivitamin  1 tablet Oral QHS  . mupirocin ointment  1 application Nasal BID  . pregabalin  75 mg Oral Daily  . tiotropium  18 mcg Inhalation Daily  . vitamin C  500 mg Oral BID    Assessment: Patient admitted for CP from nursing facility, trops 1.89. Patient takes eliquis 5 mg bid PTA (unclear why) and unsure when last dose was.  Patient is being started on heparin drip for UA/ACS Baseline labs show HL >3.6, hgb 7.6, PT/INR 15.9/1.3  Goal of Therapy:  APTT 66 - 102 seconds Heparin level 0.3-0.7 units/ml Monitor platelets by anticoagulation protocol: Yes   Plan:  APTT is high end normal. Continue  heparin 1000 units/hr. Will obtain follow up APTT at 1800.   Will obtain anti-Xa levels daily until APTT and anti-Xa levels correlate.   Pharmacy will continue to monitor and adjust per consult.   MLS 08/10/2018

## 2018-08-10 NOTE — Consult Note (Signed)
CRITICAL CARE NOTE      CHIEF COMPLAINT:   Chest pain   HPI   This is a 63 yo male with complicated PMH as below. He came in due to 6-8/10 substernal CP refractory to nitro taken at home, was noted to be hypotensive in the field 70/50 while with EMS received 325mg ASA and placed on 3LncO2. EKG with NSTEMI, LBBB, and ischemic changes.  He has been on Eliquis for DVT/PE.  He was admitted for severe epistaxis last month.  He reports chronic melanotic stools and blood streaked stools.  He had last HD session on Friday prior to this admission. He is compliant with all his medications. He denies sick contacts and routinely wears surgical face mask during all his HD sessions for additional precautions.  He has denies constitutional symptoms.   PAST MEDICAL HISTORY   Past Medical History:  Diagnosis Date  . 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 Amputee, below knee, left ([place])   . Anemia   . Asthma   . Cardiomyopathy ([place])   . CHF (congestive heart failure) ([place])   . Chronic systolic heart failure ([place])   . Complication of anesthesia    hypotension  . COPD (chronic obstructive pulmonary disease) ([place])   . Coronary artery disease   . Dialysis patient ([place])    Mon, Wed, Fri  . End stage renal disease ([place])   . GERD (gastroesophageal reflux disease)   . Headache   . History of kidney stones   . History of pulmonary embolism   . HLD (hyperlipidemia)   . HTN (hypertension)   . Hyperparathyroidism   . Myocardial infarction ([place])   . Peripheral vascular disease ([employer])   . Shortness of breath dyspnea   . Sleep apnea    NO C-PAP, Patient stated in process of  "getting one"   . Tobacco dependence      SURGICAL HISTORY   Past Surgical History:  Procedure Laterality Date  . A/V FISTULAGRAM Left  08/14/2017   Procedure: A/V FISTULAGRAM;  Surgeon: Algernon Huxley, MD;  Location: [facility];  Service: Cardiovascular;  Laterality: Left;  . AMPUTATION Left 05/06/2014   Procedure: AMPUTATION BELOW KNEE;  Surgeon: Elam Dutch, MD;  Location: [facility];  Service: Vascular;  Laterality: Left;  . AMPUTATION Right 01/12/2015   Procedure: Foot transmetatarsal amputation;  Surgeon: Algernon Huxley, MD;  Location: [facility];  Service: Vascular;  Laterality: Right;  . APPLICATION OF WOUND VAC Right 03/01/2015   Procedure: Application of Bio-connekt graft and wound vac application to right foot ;  Surgeon: Algernon Huxley, MD;  Location: [facility];  Service: Vascular;  Laterality: Right;  . AV FISTULA PLACEMENT Left   . AV FISTULA PLACEMENT Left 11/28/2016   Procedure: ARTERIOVENOUS (AV) FISTULA CREATION;  Surgeon: Algernon Huxley, MD;  Location: [facility];  Service: Vascular;  Laterality: Left;  . CARDIAC CATHETERIZATION     stent placement   . CORONARY ANGIOPLASTY    . DIALYSIS/PERMA CATHETER INSERTION N/A 07/31/2016   Procedure: Dialysis/Perma Catheter Insertion;  Surgeon: Algernon Huxley, MD;  Location: [facility];  Service: Cardiovascular;  Laterality: N/A;  . DIALYSIS/PERMA CATHETER INSERTION N/A 07/25/2017   Procedure: DIALYSIS/PERMA CATHETER INSERTION and fistulagram;  Surgeon: Algernon Huxley, MD;  Location: [facility];  Service: Cardiovascular;  Laterality: N/A;  . DIALYSIS/PERMA CATHETER REMOVAL N/A 07/22/2017   Procedure: DIALYSIS/PERMA CATHETER REMOVAL;  Surgeon: Katha Cabal,  MD;  Location: [facility];  Service: Cardiovascular;  Laterality: N/A;  . DIALYSIS/PERMA CATHETER REMOVAL N/A 02/05/2018   Procedure: DIALYSIS/PERMA CATHETER REMOVAL;  Surgeon: Algernon Huxley, MD;  Location: [facility];  Service: Cardiovascular;  Laterality: N/A;  . IR FLUORO GUIDE CV LINE LEFT  04/10/2017  . LIGATION OF ARTERIOVENOUS  FISTULA Right 01/31/2016   Procedure: LIGATION OF  ARTERIOVENOUS  FISTULA;  Surgeon: Algernon Huxley, MD;  Location: [facility];  Service: Vascular;  Laterality: Right;  . PERIPHERAL VASCULAR CATHETERIZATION Right 12/15/2014   Procedure: Lower Extremity Angiography;  Surgeon: Algernon Huxley, MD;  Location: [facility];  Service: Cardiovascular;  Laterality: Right;  . PERIPHERAL VASCULAR CATHETERIZATION  12/15/2014   Procedure: Lower Extremity Intervention;  Surgeon: Algernon Huxley, MD;  Location: [facility];  Service: Cardiovascular;;  . PERIPHERAL VASCULAR CATHETERIZATION Right 08/14/2015   Procedure: A/V Shuntogram/Fistulagram;  Surgeon: Algernon Huxley, MD;  Location: [facility];  Service: Cardiovascular;  Laterality: Right;  . PERIPHERAL VASCULAR CATHETERIZATION N/A 08/14/2015   Procedure: A/V Shunt Intervention;  Surgeon: Algernon Huxley, MD;  Location: [facility];  Service: Cardiovascular;  Laterality: N/A;  . PERIPHERAL VASCULAR CATHETERIZATION N/A 01/11/2016   Procedure: Dialysis/Perma Catheter Insertion;  Surgeon: Algernon Huxley, MD;  Location: [facility];  Service: Cardiovascular;  Laterality: N/A;  . REVISON OF ARTERIOVENOUS FISTULA Right 02/17/2016   Procedure: removal of AV fistula;  Surgeon: Serafina Mitchell, MD;  Location: [facility];  Service: Vascular;  Laterality: Right;  . REVISON OF ARTERIOVENOUS FISTULA Right 01/31/2016   Procedure: REVISON OF ARTERIOVENOUS FISTULA ( BRACHIOCEPHALIC ) W/ ARTEGRAFT;  Surgeon: Algernon Huxley, MD;  Location: [facility];  Service: Vascular;  Laterality: Right;  . TRANSMETATARSAL AMPUTATION Right 05/04/2015   Procedure: TRANSMETATARSAL AMPUTATION REVISION, great toe amputation;  Surgeon: Algernon Huxley, MD;  Location: [facility];  Service: Vascular;  Laterality: Right;     FAMILY HISTORY   Family History  Problem Relation Age of Onset  . Leukemia Mother   . Heart attack Father   . Heart failure Other   . Hypertension Other   . Leukemia Other   . Diabetes Other   . Prostate cancer Neg  Hx   . Kidney cancer Neg Hx   . Bladder Cancer Neg Hx      SOCIAL HISTORY   Social History   Tobacco Use  . Smoking status: Light Tobacco Smoker    Packs/day: 0.25    Years: 30.00    Pack years: 7.50    Types: Cigarettes  . Smokeless tobacco: Never Used  . Tobacco comment: 5  Substance Use Topics  . Alcohol use: No    Alcohol/week: 0.0 standard drinks  . Drug use: No    Comment: has used crack cocaine in past      MEDICATIONS   Current Medication:  Current Facility-Administered Medications:  .  0.9 %  sodium chloride infusion, , Intravenous, Continuous, Mansy, Jan A, MD .  acetaminophen (TYLENOL) tablet 650 mg, 650 mg, Oral, Q4H PRN, Mansy, Jan A, MD .  albuterol (PROVENTIL) (2.5 MG/3ML) 0.083% nebulizer solution 2.5 mg, 2.5 mg, Inhalation, Q6H PRN, Mansy, Jan A, MD .  Derrill Memo ON 08/11/2018] aspirin EC tablet 81 mg, 81 mg, Oral, Daily, Mansy, Jan A, MD .  atorvastatin (LIPITOR) tablet 80 mg, 80 mg, Oral, q1800, Mansy, Jan A, MD .  carvedilol (COREG) tablet 6.25 mg, 6.25 mg, Oral,  BID, Mansy, Jan A, MD .  Chlorhexidine Gluconate Cloth 2 % PADS 6 each, 6 each, Topical, Q0600, Murlean Iba, MD .  Chlorhexidine Gluconate Cloth 2 % PADS 6 each, 6 each, Topical, Q0600, Ottie Glazier, MD .  docusate sodium (COLACE) capsule 100 mg, 100 mg, Oral, Daily PRN, Mansy, Jan A, MD .  erythromycin ophthalmic ointment 1 application, 1 application, Right Eye, QHS, Mansy, Jan A, MD .  fluticasone (FLONASE) 50 MCG/ACT nasal spray 1 spray, 1 spray, Each Nare, Daily, Mansy, Jan A, MD .  guaiFENesin (ROBITUSSIN) 100 MG/5ML solution 200 mg, 200 mg, Oral, TID PRN, Mansy, Jan A, MD .  heparin ADULT infusion 100 units/mL (25000 units/255mL sodium chloride 0.45%), 1,000 Units/hr, Intravenous, Continuous, Carrie Mew, MD, Last Rate: 10 mL/hr at 08/10/18 0700, 1,000 Units/hr at 08/10/18 0700 .  ipratropium-albuterol (DUONEB) 0.5-2.5 (3) MG/3ML nebulizer solution 3 mL, 3 mL, Inhalation, Q6H PRN,  Mansy, Jan A, MD .  isosorbide mononitrate (IMDUR) 24 hr tablet 30 mg, 30 mg, Oral, Daily, Mansy, Jan A, MD .  loratadine (CLARITIN) tablet 10 mg, 10 mg, Oral, Daily, Mansy, Jan A, MD .  midodrine (PROAMATINE) tablet 10 mg, 10 mg, Oral, Q8H, Mansy, Jan A, MD, 10 mg at 08/10/18 5093 .  multivitamin with minerals tablet 1 tablet, 1 tablet, Oral, BID, Mansy, Jan A, MD .  mupirocin ointment (BACTROBAN) 2 % 1 application, 1 application, Nasal, BID, Roarke Marciano, MD .  nitroGLYCERIN 50 mg in dextrose 5 % 250 mL (0.2 mg/mL) infusion, 0-30 mcg/min, Intravenous, Titrated, Mansy, Jan A, MD .  norepinephrine (LEVOPHED) 4 mg in dextrose 5 % 250 mL (0.016 mg/mL) infusion, 0-40 mcg/min, Intravenous, Titrated, Mansy, Jan A, MD .  ondansetron [facility]) injection 4 mg, 4 mg, Intravenous, Q6H PRN, Mansy, Jan A, MD .  pantoprazole (PROTONIX) 80 mg in sodium chloride 0.9 % 250 mL (0.32 mg/mL) infusion, 8 mg/hr, Intravenous, Continuous, Carrie Mew, MD, Last Rate: 25 mL/hr at 08/10/18 0503, 8 mg/hr at 08/10/18 0503 .  polyethylene glycol (MIRALAX / GLYCOLAX) packet 17 g, 17 g, Oral, Daily PRN, Mansy, Jan A, MD .  pregabalin (LYRICA) capsule 75 mg, 75 mg, Oral, Daily, Mansy, Jan A, MD .  tiotropium [facility]) inhalation capsule ([facility] use ONLY) 18 mcg, 18 mcg, Inhalation, Daily, Mansy, Jan A, MD .  traMADol (ULTRAM) tablet 75 mg, 75 mg, Oral, Q8H PRN, Mansy, Jan A, MD .  zolpidem (AMBIEN) tablet 5 mg, 5 mg, Oral, QHS PRN,MR X 1, Mansy, Jan A, MD    ALLERGIES   Dust mite extract    REVIEW OF SYSTEMS    Vitals:   08/10/18 0800 08/10/18 0824  BP: 118/77   Pulse: 76 74  Resp: 10 (!) 9  Temp:  36.5 C  SpO2: 92% 96%    PATIENT IS UNABLE TO PROVIDE COMPLETE REVIEW OF SYSTEMS DUE TO ACUTE CRITICAL ILLNESS    PHYSICAL EXAMINATION   GENERAL:critically ill appearing, +resp distress HEAD: Normocephalic, atraumatic.  EYES: Pupils equal, round, reactive to light.  No scleral icterus.  MOUTH: Moist  mucosal membrane. NECK: Supple. No thyromegaly. No nodules. No JVD.  PULMONARY: +rhonchi at bases CARDIOVASCULAR: S1 and S2. Regular rate and rhythm. No murmurs, rubs, or gallops.  GASTROINTESTINAL: Soft, nontender, non-distended. No masses. Positive bowel sounds. No hepatosplenomegaly.  MUSCULOSKELETAL: No swelling, clubbing, or edema.  NEUROLOGIC: Mild distress due to acute illness SKIN:intact,warm,dry   LABS AND IMAGING     -I personally reviewed most recent blood work, imaging and microbiology -  significant findings today are : acute blood loss anemia, CXR with interstitial prominence suggestive of pulmonary edema.  LAB RESULTS: Recent Labs  Lab 08/04/18 1305 08/10/18 0243  NA 141 138  K 3.7 4.1  CL 100 103  CO2 29 21*  BUN 56* 67*  CREATININE 8.38* 10.25*  GLUCOSE 98 131*   Recent Labs  Lab 08/10/18 0243  HGB 7.6*  HCT 24.2*  WBC 8.9  PLT 209     IMAGING RESULTS: Dg Chest Portable 1 View  Result Date: 08/10/2018 CLINICAL DATA:  Chest pain EXAM: PORTABLE CHEST 1 VIEW COMPARISON:  04/24/2018 FINDINGS: Cardiomegaly. Bilateral airspace disease with small bilateral effusions. Findings likely reflect edema/CHF. No acute bony abnormality. IMPRESSION: Cardiomegaly with bilateral effusions and diffuse bilateral airspace disease, likely edema/CHF. Electronically Signed   By: Rolm Baptise M.D.   On: 08/10/2018 03:09      ASSESSMENT AND PLAN    -Multidisciplinary rounds held today  Acute blood loss anemia    - Hematochezia - likely related to oral anticoagulant agent    - H/H trending -PRBC transfusion with goal Hb>8 due to active CAD    - GI evaluation   Acute Hypoxic Respiratory Failure -continue supportive measures - [place] O2 goal O2 saturation 90% -continue Bronchodilator Therapy -likely due to fluid overload  CARDIAC FAILURE- -demand ischemia/NSTEMI - Cardiology on case - appreciate input -follow up cardiac enzymes as indicated ICU monitoring  Renal  Failure-most likely due to ATN -Nephrology on case - appreciate input - for HD today    ID -continue IV abx as prescibed-nasal MRSA + -follow up cultures  GI/Nutrition GI PROPHYLAXIS as indicated DIET-->TF's as tolerated Constipation protocol as indicated  ENDO - ICU hypoglycemic\Hyperglycemia protocol -check FSBS per protocol   ELECTROLYTES -follow labs as needed -replace as needed -pharmacy consultation   DVT/GI PRX ordered -SCDs  TRANSFUSIONS AS NEEDED MONITOR FSBS ASSESS the need for LABS as needed   Critical care provider statement:    Critical care time (minutes):  42   Critical care time was exclusive of:  Separately billable procedures and treating other patients   Critical care was necessary to treat or prevent imminent or life-threatening deterioration of the following conditions:   Acute blood loss anemia, acute on chronic renal insufficiency, hypotension.   Critical care was time spent personally by me on the following activities:  Development of treatment plan with patient or surrogate, discussions with consultants, evaluation of patient's response to treatment, examination of patient, obtaining history from patient or surrogate, ordering and performing treatments and interventions, ordering and review of laboratory studies and re-evaluation of patient's condition.  I assumed direction of critical care for this patient from another provider in my specialty: no    This document was prepared using Dragon voice recognition software and may include unintentional dictation errors.    Ottie Glazier, M.D.  Division of [place]

## 2018-08-10 NOTE — TOC Initial Note (Signed)
Transition of Care [facility]) - Initial/Assessment Note    Patient Details  Name: [name] MRN: [id number] Date of Birth: [birth year]  [facility] of [facility]) CM/SW Contact:    Shelbie Hutching, RN Phone Number: 08/10/2018, 2:32 PM  Clinical Narrative:                 Patient admitted for NSTEMI, arrived to the ED by EMS from [facility] assisted living in [place].  Patient is a dialysis patient, Elvera Bicker of [facility] notified of patient admission.   Patient is getting dialysis today and an ECHO bedside.  No family present at this time.  RNCM will cont to follow for any discharge needs.   Expected Discharge Plan: Assisted Living     Patient Goals and CMS Choice        Expected Discharge Plan and Services Expected Discharge Plan: Assisted Living     Living arrangements for the past 2 months: [place]                          Prior Living Arrangements/Services Living arrangements for the past 2 months: [place] Lives with:: Facility Resident   Do you feel safe going back to the place where you live?: Yes               Activities of Daily Living      Permission Sought/Granted                  Emotional Assessment Appearance:: Appears stated age Attitude/Demeanor/Rapport: Engaged Affect (typically observed): Accepting Orientation: : Oriented to Self, Oriented to Place, Oriented to Situation, Oriented to  Time Alcohol / Substance Use: Not Applicable Psych Involvement: No (comment)  Admission diagnosis:  Pulmonary edema cardiac cause (HCC) [I50.1] Acute GI bleeding [K92.2] NSTEMI (non-ST elevated myocardial infarction) (HCC) [I21.4] End-stage renal disease on hemodialysis ([place]) [N18.6, Z99.2] Acute respiratory failure with hypoxia (HCC) [J96.01] Symptomatic anemia [D64.9] Patient Active Problem List   Diagnosis Date Noted  . Non-STEMI (non-ST elevated myocardial infarction) ([place]) 08/10/2018  .  HCAP (healthcare-associated pneumonia) 04/22/2018  . Acute on chronic respiratory failure with hypoxia ([place]) 12/21/2017  . Tobacco use 10/07/2017  . Apnea   . End-stage renal disease on hemodialysis ([place])   . Hypervolemia   . Altered mental status   . Acute on chronic systolic CHF (congestive heart failure) ([place]) 01/18/2017  . Pulmonary embolism ([place]) 08/03/2016  . NSTEMI (non-ST elevated myocardial infarction) ([place]) 07/26/2016  . PVD (peripheral vascular disease) ([place]) 03/05/2016  . Preop examination 05/02/2015  . Chronic systolic heart failure ([place])   . Hyperkalemia 01/13/2015  . Normocytic anemia 05/04/2014  . Hyperlipidemia 04/26/2010  . HYPERTENSION, BENIGN 04/26/2010  . CAD, NATIVE VESSEL 04/26/2010   PCP:  Donnie Coffin, MD Pharmacy:   [place], [place] [address] Phone: [phone] Fax: [phone]     Social Determinants of Health (SDOH) Interventions    Readmission Risk Interventions 30 Day Unplanned Readmission Risk Score     ED to Hosp-Admission (Current) from 08/10/2018 in [facility] ICU/CCU  30 Day Unplanned Readmission Risk Score (%)  30 Filed at 08/10/2018 1200     This score is the patient's risk of an unplanned readmission within 30 days of being discharged (0 -100%). The score is based on dignosis, age, lab data, medications, orders, and past  utilization.   Low:  0-14.9   Medium: 15-21.9   High: 22-29.9   Extreme: 30 and above       Readmission Risk Prevention Plan 04/24/2018  Transportation Screening Complete  Medication Review [employer]) Complete  PCP or Specialist appointment within 3-5 days of discharge Complete  HRI or Home Care Consult Complete  SW Recovery Care/Counseling Consult Complete  Palliative Care Screening Not Complete  Medication Reconcilation (Pharmacy) Complete  Skilled [place] Not Complete  Some recent data might be hidden

## 2018-08-10 NOTE — ED Notes (Signed)
ED TO INPATIENT HANDOFF REPORT  ED Nurse Name and Phone #: [phone]  S Name/Age/Gender [name] 63 y.o. male Room/Bed: ED07A/ED07A  Code Status   Code Status: Full Code  Home/SNF/Other Nursing Home Patient oriented to: self, place, time and situation Is this baseline? Yes   Triage Complete: Triage complete  Chief Complaint chest pain  Triage Note Pt arrives via [facility] with c/o intermittent chest pain x 2 weeks. Pt took nitro x 1 before EMS arrival on the scene.EMS reports that pt wears O2 2-3 L PRN at night but was 88% on RA with EMS. Pt otherwise has normal VS for EMS upon arrival. Pt arrives from [facility] and EMS also administered 324 mg aspirin. Pt is hurting at this time.    Allergies Allergies  Allergen Reactions  . Dust Mite Extract Other (See Comments)    Reaction: unknown    Level of Care/Admitting Diagnosis ED Disposition    ED Disposition Condition Comment   Admit  Hospital Area: [facility] [[id number]]  Level of Care: Stepdown [14]  Diagnosis: Non-STEMI (non-ST elevated myocardial infarction) [facility]) [[id number]]  Admitting Physician: Christel Mormon [[id number]]  Attending Physician: Christel Mormon [[id number]]  Estimated length of stay: 3 - 4 days  Certification:: I certify this patient will need inpatient services for at least 2 midnights  PT Class (Do Not Modify): Inpatient [101]  PT Acc Code (Do Not Modify): Private [1]       B Medical/Surgery History Past Medical History:  Diagnosis Date  . 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 Amputee, below knee, left ([place])   . Anemia   . Asthma   . Cardiomyopathy ([place])   . CHF (congestive heart failure) ([place])   . Chronic systolic heart failure ([place])   . Complication of anesthesia    hypotension  . COPD (chronic obstructive pulmonary disease) ([place])   . Coronary artery disease   . Dialysis patient ([place])    Mon, Wed, Fri  . End stage renal disease ([place])   . GERD  (gastroesophageal reflux disease)   . Headache   . History of kidney stones   . History of pulmonary embolism   . HLD (hyperlipidemia)   . HTN (hypertension)   . Hyperparathyroidism   . Myocardial infarction ([place])   . Peripheral vascular disease ([place])   . Shortness of breath dyspnea   . Sleep apnea    NO C-PAP, Patient stated in process of  "getting one"   . Tobacco dependence    Past Surgical History:  Procedure Laterality Date  . A/V FISTULAGRAM Left 08/14/2017   Procedure: A/V FISTULAGRAM;  Surgeon: Algernon Huxley, MD;  Location: [place] [facility];  Service: Cardiovascular;  Laterality: Left;  . AMPUTATION Left 05/06/2014   Procedure: AMPUTATION BELOW KNEE;  Surgeon: Elam Dutch, MD;  Location: [place];  Service: Vascular;  Laterality: Left;  . AMPUTATION Right 01/12/2015   Procedure: Foot transmetatarsal amputation;  Surgeon: Algernon Huxley, MD;  Location: [facility] ORS;  Service: Vascular;  Laterality: Right;  . APPLICATION OF WOUND VAC Right 03/01/2015   Procedure: Application of Bio-connekt graft and wound vac application to right foot ;  Surgeon: Algernon Huxley, MD;  Location: [facility] ORS;  Service: Vascular;  Laterality: Right;  . AV FISTULA PLACEMENT Left   . AV FISTULA PLACEMENT Left 11/28/2016   Procedure: ARTERIOVENOUS (AV) FISTULA CREATION;  Surgeon: Algernon Huxley, MD;  Location: [facility]  ORS;  Service: Vascular;  Laterality: Left;  . CARDIAC CATHETERIZATION     stent placement   . CORONARY ANGIOPLASTY    . DIALYSIS/PERMA CATHETER INSERTION N/A 07/31/2016   Procedure: Dialysis/Perma Catheter Insertion;  Surgeon: Algernon Huxley, MD;  Location: [place] [facility];  Service: Cardiovascular;  Laterality: N/A;  . DIALYSIS/PERMA CATHETER INSERTION N/A 07/25/2017   Procedure: DIALYSIS/PERMA CATHETER INSERTION and fistulagram;  Surgeon: Algernon Huxley, MD;  Location: [place] [facility];  Service: Cardiovascular;  Laterality: N/A;  . DIALYSIS/PERMA CATHETER REMOVAL N/A 07/22/2017   Procedure:  DIALYSIS/PERMA CATHETER REMOVAL;  Surgeon: Katha Cabal, MD;  Location: [place] [facility];  Service: Cardiovascular;  Laterality: N/A;  . DIALYSIS/PERMA CATHETER REMOVAL N/A 02/05/2018   Procedure: DIALYSIS/PERMA CATHETER REMOVAL;  Surgeon: Algernon Huxley, MD;  Location: [place] [facility];  Service: Cardiovascular;  Laterality: N/A;  . IR FLUORO GUIDE CV LINE LEFT  04/10/2017  . LIGATION OF ARTERIOVENOUS  FISTULA Right 01/31/2016   Procedure: LIGATION OF ARTERIOVENOUS  FISTULA;  Surgeon: Algernon Huxley, MD;  Location: [facility] ORS;  Service: Vascular;  Laterality: Right;  . PERIPHERAL VASCULAR CATHETERIZATION Right 12/15/2014   Procedure: Lower Extremity Angiography;  Surgeon: Algernon Huxley, MD;  Location: [place] [facility];  Service: Cardiovascular;  Laterality: Right;  . PERIPHERAL VASCULAR CATHETERIZATION  12/15/2014   Procedure: Lower Extremity Intervention;  Surgeon: Algernon Huxley, MD;  Location: [place] [facility];  Service: Cardiovascular;;  . PERIPHERAL VASCULAR CATHETERIZATION Right 08/14/2015   Procedure: A/V Shuntogram/Fistulagram;  Surgeon: Algernon Huxley, MD;  Location: [place] [facility];  Service: Cardiovascular;  Laterality: Right;  . PERIPHERAL VASCULAR CATHETERIZATION N/A 08/14/2015   Procedure: A/V Shunt Intervention;  Surgeon: Algernon Huxley, MD;  Location: [place] [facility];  Service: Cardiovascular;  Laterality: N/A;  . PERIPHERAL VASCULAR CATHETERIZATION N/A 01/11/2016   Procedure: Dialysis/Perma Catheter Insertion;  Surgeon: Algernon Huxley, MD;  Location: [place] [facility];  Service: Cardiovascular;  Laterality: N/A;  . REVISON OF ARTERIOVENOUS FISTULA Right 02/17/2016   Procedure: removal of AV fistula;  Surgeon: Serafina Mitchell, MD;  Location: [facility];  Service: Vascular;  Laterality: Right;  . REVISON OF ARTERIOVENOUS FISTULA Right 01/31/2016   Procedure: REVISON OF ARTERIOVENOUS FISTULA ( BRACHIOCEPHALIC ) W/ ARTEGRAFT;  Surgeon: Algernon Huxley, MD;  Location: [facility] OR[facility];  Service:  Vascular;  Laterality: Right;  . TRANSMETATARSAL AMPUTATION Right 05/04/2015   Procedure: TRANSMETATARSAL AMPUTATION REVISION, great toe amputation;  Surgeon: Algernon Huxley, MD;  Location: [facility];  Service: Vascular;  Laterality: Right;     A IV Location/Drains/Wounds Patient Lines/Drains/Airways Status   Active Line/Drains/Airways    Name:   Placement date:   Placement time:   Site:   Days:   Peripheral IV 08/10/18 Right Hand   08/10/18    0250    Hand   less than 1   Peripheral IV 08/10/18 Right Forearm   08/10/18    0323    Forearm   less than 1   Peripheral IV 08/10/18 Right Forearm   08/10/18    0422    Forearm   less than 1   Fistula / Graft Left Forearm   01/19/17    0150    Forearm   568   Hemodialysis Catheter Right Femoral vein   07/25/17    1301    Femoral vein   381          Intake/Output Last 24  hours No intake or output data in the 24 hours ending 08/10/18 6270  Labs/Imaging Results for orders placed or performed during the hospital encounter of 08/10/18 (from the past 48 hour(s))  Comprehensive metabolic panel     Status: Abnormal   Collection Time: 08/10/18  2:43 AM  Result Value Ref Range   Sodium 138 135 - 145 mmol/L   Potassium 4.1 3.5 - 5.1 mmol/L   Chloride 103 98 - 111 mmol/L   CO2 21 (L) 22 - 32 mmol/L   Glucose, Bld 131 (H) 70 - 99 mg/dL   BUN 67 (H) 8 - 23 mg/dL   Creatinine, Ser 10.25 (H) 0.61 - 1.24 mg/dL   Calcium 9.1 8.9 - 10.3 mg/dL   Total Protein 6.6 6.5 - 8.1 g/dL   Albumin 3.0 (L) 3.5 - 5.0 g/dL   AST 35 15 - 41 U/L   ALT 29 0 - 44 U/L   Alkaline Phosphatase 82 38 - 126 U/L   Total Bilirubin 0.3 0.3 - 1.2 mg/dL   GFR calc non Af Amer 5 (L) >60 mL/min   GFR calc Af Amer 6 (L) >60 mL/min   Anion gap 14 5 - 15    Comment: Performed at [facility], [address] [place], [place] [address]  Lipase, blood     Status: Abnormal   Collection Time: 08/10/18  2:43 AM  Result Value Ref Range   Lipase 86 (H) 11 - 51 U/L    Comment:  Performed at [facility], [address] [place], [place] [address]  CBC with Differential     Status: Abnormal   Collection Time: 08/10/18  2:43 AM  Result Value Ref Range   WBC 8.9 4.0 - 10.5 K/uL   RBC 2.41 (L) 4.22 - 5.81 MIL/uL   Hemoglobin 7.6 (L) 13.0 - 17.0 g/dL   HCT 24.2 (L) 39.0 - 52.0 %   MCV 100.4 (H) 80.0 - 100.0 fL   MCH 31.5 26.0 - 34.0 pg   MCHC 31.4 30.0 - 36.0 g/dL   RDW 15.6 (H) 11.5 - 15.5 %   Platelets 209 150 - 400 K/uL   nRBC 0.5 (H) 0.0 - 0.2 %   Neutrophils Relative % 62 %   Neutro Abs 5.5 1.7 - 7.7 K/uL   Lymphocytes Relative 22 %   Lymphs Abs 2.0 0.7 - 4.0 K/uL   Monocytes Relative 12 %   Monocytes Absolute 1.1 (H) 0.1 - 1.0 K/uL   Eosinophils Relative 3 %   Eosinophils Absolute 0.2 0.0 - 0.5 K/uL   Basophils Relative 0 %   Basophils Absolute 0.0 0.0 - 0.1 K/uL   Immature Granulocytes 1 %   Abs Immature Granulocytes 0.05 0.00 - 0.07 K/uL    Comment: Performed at [facility], [address] [place], [place] [address]  Troponin I - Once     Status: Abnormal   Collection Time: 08/10/18  2:43 AM  Result Value Ref Range   Troponin I 1.89 (HH) <0.03 ng/mL    Comment: CRITICAL RESULT CALLED TO, READ BACK BY AND VERIFIED WITH Nidya Bouyer AT 6967 08/10/2018.  TFK Performed at [facility], [address] [place], [place] [address]   Prepare RBC     Status: None   Collection Time: 08/10/18  4:15 AM  Result Value Ref Range   Order Confirmation      ORDER PROCESSED BY BLOOD BANK Performed at [facility], [place],  [place] [address]   Type and screen [place]     Status: None (Preliminary result)   Collection Time: 08/10/18  4:15 AM  Result Value Ref Range   ABO/RH(D) B POS    Antibody Screen NEG    Sample Expiration 08/13/2018    Unit Number [id number]    Blood Component Type RED CELLS,LR    Unit division 00    Status of Unit ISSUED    Transfusion Status OK TO  TRANSFUSE    Crossmatch Result      Compatible Performed at [facility], [address] [place], [place] [address]    Unit Number [id number]    Blood Component Type RED CELLS,LR    Unit division 00    Status of Unit ALLOCATED    Transfusion Status OK TO TRANSFUSE    Crossmatch Result Compatible   APTT     Status: None   Collection Time: 08/10/18  4:15 AM  Result Value Ref Range   aPTT 36 24 - 36 seconds    Comment: Performed at [facility], [address] [place], [place] [address]  Protime-INR     Status: Abnormal   Collection Time: 08/10/18  4:15 AM  Result Value Ref Range   Prothrombin Time 15.9 (H) 11.4 - 15.2 seconds   INR 1.3 (H) 0.8 - 1.2    Comment: (NOTE) INR goal varies based on device and disease states. Performed at [facility], [place], [place] [address]   Heparin level (unfractionated)     Status: Abnormal   Collection Time: 08/10/18  4:15 AM  Result Value Ref Range   Heparin Unfractionated >3.60 (H) 0.30 - 0.70 IU/mL    Comment: RESULTS CONFIRMED BY MANUAL DILUTION (NOTE) If heparin results are below expected values, and patient dosage has  been confirmed, suggest follow up testing of antithrombin III levels. Performed at [facility], [address] [place], [place] [address]    Dg Chest Portable 1 View  Result Date: 08/10/2018 CLINICAL DATA:  Chest pain EXAM: PORTABLE CHEST 1 VIEW COMPARISON:  04/24/2018 FINDINGS: Cardiomegaly. Bilateral airspace disease with small bilateral effusions. Findings likely reflect edema/CHF. No acute bony abnormality. IMPRESSION: Cardiomegaly with bilateral effusions and diffuse bilateral airspace disease, likely edema/CHF. Electronically Signed   By: Rolm Baptise M.D.   On: 08/10/2018 03:09    Pending Labs Unresulted Labs (From admission, onward)    Start     Ordered   08/11/18 7035  Basic metabolic panel  Tomorrow morning,   STAT     08/10/18 0546   08/11/18  0500  Lipid panel  Tomorrow morning,   STAT     08/10/18 0546   08/11/18 0500  CBC  Tomorrow morning,   STAT     08/10/18 0546   08/11/18 0500  Protime-INR  Tomorrow morning,   STAT     08/10/18 0546   08/10/18 1100  Heparin level (unfractionated)  Once-Timed,   STAT     08/10/18 0529   08/10/18 1100  APTT  Once-Timed,   STAT     08/10/18 0529   08/10/18 0538  Phosphorus  Add-on,   AD     08/10/18 0537          Vitals/Pain Today's Vitals   08/10/18 0430 08/10/18 0447 08/10/18 0500 08/10/18 0533  BP: 106/79  101/63 109/77  Pulse: 80  80 80  Resp: (!) 9  15 (!) 8  Temp:  TempSrc:      SpO2: 98%  100% 100%  Weight:      Height:      PainSc:  Asleep      Isolation Precautions No active isolations  Medications Medications  0.9 %  sodium chloride infusion (has no administration in time range)  heparin ADULT infusion 100 units/mL (25000 units/258mL sodium chloride 0.45%) (1,000 Units/hr Intravenous New Bag/Given 08/10/18 0451)  pantoprazole (PROTONIX) 80 mg in sodium chloride 0.9 % 250 mL (0.32 mg/mL) infusion (8 mg/hr Intravenous New Bag/Given 08/10/18 0503)  traMADol (ULTRAM) tablet 75 mg (has no administration in time range)  carvedilol (COREG) tablet 6.25 mg (has no administration in time range)  isosorbide mononitrate (IMDUR) 24 hr tablet 30 mg (has no administration in time range)  midodrine (PROAMATINE) tablet 10 mg (has no administration in time range)  calcium acetate (PHOSLO) capsule 667 mg (has no administration in time range)  docusate sodium (COLACE) capsule 100 mg (has no administration in time range)  polyethylene glycol (MIRALAX / GLYCOLAX) packet 17 g (has no administration in time range)  sevelamer carbonate (RENVELA) tablet 800 mg (has no administration in time range)  pregabalin (LYRICA) capsule 75 mg (has no administration in time range)  multivitamin with minerals tablet 1 tablet (has no administration in time range)  albuterol (PROVENTIL) (2.5  MG/3ML) 0.083% nebulizer solution 2.5 mg (has no administration in time range)  loratadine (CLARITIN) tablet 10 mg (has no administration in time range)  fluticasone (FLONASE) 50 MCG/ACT nasal spray 1 spray (has no administration in time range)  guaifenesin (ROBITUSSIN) 100 MG/5ML syrup 200 mg (has no administration in time range)  ipratropium-albuterol (DUONEB) 0.5-2.5 (3) MG/3ML nebulizer solution 3 mL (has no administration in time range)  tiotropium (SPIRIVA) inhalation capsule ([facility] use ONLY) 18 mcg (has no administration in time range)  erythromycin ophthalmic ointment 1 application (has no administration in time range)  aspirin EC tablet 81 mg (has no administration in time range)  acetaminophen (TYLENOL) tablet 650 mg (has no administration in time range)  ondansetron (ZOFRAN) injection 4 mg (has no administration in time range)  0.9 %  sodium chloride infusion (has no administration in time range)  nitroGLYCERIN 50 mg in dextrose 5 % 250 mL (0.2 mg/mL) infusion (has no administration in time range)  atorvastatin (LIPITOR) tablet 80 mg (has no administration in time range)  zolpidem (AMBIEN) tablet 5 mg (has no administration in time range)  morphine 4 MG/ML injection 4 mg (4 mg Intravenous Given 08/10/18 0252)  ondansetron (ZOFRAN) injection 4 mg (4 mg Intravenous Given 08/10/18 0252)  HYDROmorphone (DILAUDID) injection 1 mg (1 mg Intravenous Given 08/10/18 0413)  pantoprazole (PROTONIX) 80 mg in sodium chloride 0.9 % 100 mL IVPB (0 mg Intravenous Stopped 08/10/18 0535)    Mobility non-ambulatory Low fall risk   Focused Assessments Cardiac Assessment Handoff:    Lab Results  Component Value Date   CKTOTAL 145 08/04/2018   CKMB 5.2 (H) 08/04/2018   TROPONINI 1.89 ([place]) 08/10/2018   No results found for: DDIMER Does the Patient currently have chest pain? No  , Neuro Assessment Handoff:  Swallow screen pass? Yes          Neuro Assessment:   Neuro Checks:      Last  Documented NIHSS Modified Score:   Has TPA been given? No If patient is a Neuro Trauma and patient is going to OR before floor call report to [facility] nurse: [phone] or [phone]  , Renal Assessment Handoff:  Hemodialysis Schedule: Hemodialysis Schedule: Monday/Wednesday/Friday Last Hemodialysis date and time: 08/07/2018   Restricted appendage: left arm     R Recommendations: See Admitting Provider Note  Report given to:   Additional Notes:

## 2018-08-10 NOTE — Consult Note (Signed)
[facility] Consultation Note  Patient ID: [name], MRN: [id number], DOB/AGE: Dec 30, 1955 63 y.o. Admit date: 08/10/2018   Date of Consult: 08/10/2018 Primary Physician: Donnie Coffin, MD Primary Cardiologist: Nehemiah Massed  Chief Complaint:  Chief Complaint  Patient presents with  . Chest Pain   Reason for Consult: Chest pain  HPI: 63 y.o. male with known coronary artery disease status post previous PCI and stent placement and anterior and apical myocardial infarction with chronic systolic dysfunction congestive heart failure with ejection fraction of 25%.  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  Previously the patient has been on appropriate medication management for these issues and has received dialysis on Friday.  On that day he had hypotension weakness fatigue and chest discomfort which continued to male throughout the weekend consistent with congestive heart failure and possible myocardial infarction.  The patient was seen in the emergency room at this time with substernal chest discomfort radiating into the back and left arm associated with shortness of breath and hypotension.  EKG showed normal sinus rhythm with left bundle branch block and some slight inferior changes different from before.  The patient has had troponin elevation of 1.8 consistent with non-ST elevation myocardial infarction as well as hemoglobin of 7.6 consistent with bleeding.  Patient did have further evaluation of bleeding with some darker stool and positive Hemoccult.  This suggests he may have some upper GI bleeding.  He has previously been on Eliquis for which she had a last dose 8 hours prior for which may be contributing.  He did have a chest x-ray showing some pulmonary edema as well as pleural effusions consistent with congestive heart failure.  He is more hemodynamically stable at this time with no further  evidence of chest discomfort  Past Medical History:  Diagnosis Date  . 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 Amputee, below knee, left ([place])   . Anemia   . Asthma   . Cardiomyopathy ([place])   . CHF (congestive heart failure) ([place])   . Chronic systolic heart failure ([place])   . Complication of anesthesia    hypotension  . COPD (chronic obstructive pulmonary disease) ([place])   . Coronary artery disease   . Dialysis patient ([place])    Mon, Wed, Fri  . End stage renal disease ([place])   . GERD (gastroesophageal reflux disease)   . Headache   . History of kidney stones   . History of pulmonary embolism   . HLD (hyperlipidemia)   . HTN (hypertension)   . Hyperparathyroidism   . Myocardial infarction ([place])   . Peripheral vascular disease ([place])   . Shortness of breath dyspnea   . Sleep apnea    NO C-PAP, Patient stated in process of  "getting one"   . Tobacco dependence       Surgical History:  Past Surgical History:  Procedure Laterality Date  . A/V FISTULAGRAM Left 08/14/2017   Procedure: A/V FISTULAGRAM;  Surgeon: Algernon Huxley, MD;  Location: [facility] [facility];  Service: Cardiovascular;  Laterality: Left;  . AMPUTATION Left 05/06/2014   Procedure: AMPUTATION BELOW KNEE;  Surgeon: Elam Dutch, MD;  Location: [facility];  Service: Vascular;  Laterality: Left;  . AMPUTATION Right 01/12/2015   Procedure: Foot transmetatarsal amputation;  Surgeon: Algernon Huxley, MD;  Location: [facility];  Service: Vascular;  Laterality: Right;  . APPLICATION OF WOUND VAC  Right 03/01/2015   Procedure: Application of Bio-connekt graft and wound vac application to right foot ;  Surgeon: Algernon Huxley, MD;  Location: [facility];  Service: Vascular;  Laterality: Right;  . AV FISTULA PLACEMENT Left   . AV FISTULA PLACEMENT Left 11/28/2016   Procedure: ARTERIOVENOUS (AV) FISTULA CREATION;  Surgeon: Algernon Huxley, MD;  Location: [facility];  Service: Vascular;  Laterality: Left;  . CARDIAC CATHETERIZATION      stent placement   . CORONARY ANGIOPLASTY    . DIALYSIS/PERMA CATHETER INSERTION N/A 07/31/2016   Procedure: Dialysis/Perma Catheter Insertion;  Surgeon: Algernon Huxley, MD;  Location: [facility];  Service: Cardiovascular;  Laterality: N/A;  . DIALYSIS/PERMA CATHETER INSERTION N/A 07/25/2017   Procedure: DIALYSIS/PERMA CATHETER INSERTION and fistulagram;  Surgeon: Algernon Huxley, MD;  Location: [facility];  Service: Cardiovascular;  Laterality: N/A;  . DIALYSIS/PERMA CATHETER REMOVAL N/A 07/22/2017   Procedure: DIALYSIS/PERMA CATHETER REMOVAL;  Surgeon: Katha Cabal, MD;  Location: [facility];  Service: Cardiovascular;  Laterality: N/A;  . DIALYSIS/PERMA CATHETER REMOVAL N/A 02/05/2018   Procedure: DIALYSIS/PERMA CATHETER REMOVAL;  Surgeon: Algernon Huxley, MD;  Location: [facility];  Service: Cardiovascular;  Laterality: N/A;  . IR FLUORO GUIDE CV LINE LEFT  04/10/2017  . LIGATION OF ARTERIOVENOUS  FISTULA Right 01/31/2016   Procedure: LIGATION OF ARTERIOVENOUS  FISTULA;  Surgeon: Algernon Huxley, MD;  Location: [facility];  Service: Vascular;  Laterality: Right;  . PERIPHERAL VASCULAR CATHETERIZATION Right 12/15/2014   Procedure: Lower Extremity Angiography;  Surgeon: Algernon Huxley, MD;  Location: [facility];  Service: Cardiovascular;  Laterality: Right;  . PERIPHERAL VASCULAR CATHETERIZATION  12/15/2014   Procedure: Lower Extremity Intervention;  Surgeon: Algernon Huxley, MD;  Location: [facility];  Service: Cardiovascular;;  . PERIPHERAL VASCULAR CATHETERIZATION Right 08/14/2015   Procedure: A/V Shuntogram/Fistulagram;  Surgeon: Algernon Huxley, MD;  Location: [facility];  Service: Cardiovascular;  Laterality: Right;  . PERIPHERAL VASCULAR CATHETERIZATION N/A 08/14/2015   Procedure: A/V Shunt Intervention;  Surgeon: Algernon Huxley, MD;  Location: [facility];  Service: Cardiovascular;  Laterality: N/A;  . PERIPHERAL VASCULAR CATHETERIZATION N/A  01/11/2016   Procedure: Dialysis/Perma Catheter Insertion;  Surgeon: Algernon Huxley, MD;  Location: [facility];  Service: Cardiovascular;  Laterality: N/A;  . REVISON OF ARTERIOVENOUS FISTULA Right 02/17/2016   Procedure: removal of AV fistula;  Surgeon: Serafina Mitchell, MD;  Location: [facility];  Service: Vascular;  Laterality: Right;  . REVISON OF ARTERIOVENOUS FISTULA Right 01/31/2016   Procedure: REVISON OF ARTERIOVENOUS FISTULA ( BRACHIOCEPHALIC ) W/ ARTEGRAFT;  Surgeon: Algernon Huxley, MD;  Location: [facility];  Service: Vascular;  Laterality: Right;  . TRANSMETATARSAL AMPUTATION Right 05/04/2015   Procedure: TRANSMETATARSAL AMPUTATION REVISION, great toe amputation;  Surgeon: Algernon Huxley, MD;  Location: [facility];  Service: Vascular;  Laterality: Right;     Home Meds: Prior to Admission medications   Medication Sig Start Date End Date Taking? Authorizing Provider  acetaminophen (TYLENOL) 500 MG tablet Take 500 mg by mouth every 6 (six) hours as needed for mild pain.    Yes [provider]  albuterol (PROVENTIL HFA;VENTOLIN HFA) 108 (90 Base) MCG/ACT inhaler Inhale 2 puffs into the lungs every 6 (six) hours as needed for wheezing or shortness of breath.    Yes [provider]  albuterol (PROVENTIL) (2.5 MG/3ML) 0.083% nebulizer solution Take 3  mLs (2.5 mg total) by nebulization every 6 (six) hours as needed for wheezing or shortness of breath. 04/24/18  Yes Gladstone Lighter, MD  apixaban (ELIQUIS) 5 MG TABS tablet Take 1 tablet (5 mg total) by mouth 2 (two) times daily. 01/20/17  Yes Gladstone Lighter, MD  aspirin EC 325 MG tablet Take 325 mg by mouth daily.    Yes [provider]  atorvastatin (LIPITOR) 10 MG tablet Take 10 mg by mouth at bedtime.    Yes [provider]  budesonide-formoterol (SYMBICORT) 160-4.5 MCG/ACT inhaler Inhale 2 puffs into the lungs 2 (two) times daily.   Yes [provider]  calcium acetate (PHOSLO) 667 MG capsule Take  667 mg by mouth daily.    Yes [provider]  carvedilol (COREG) 6.25 MG tablet Take 1 tablet (6.25 mg total) by mouth 2 (two) times daily. 10/07/17  Yes Hackney, Otila Kluver A, FNP  cetirizine (ZYRTEC) 10 MG tablet Take 10 mg by mouth daily.   Yes [provider]  docusate sodium (COLACE) 100 MG capsule Take 100 mg by mouth daily as needed for mild constipation or moderate constipation.    Yes [provider]  erythromycin ophthalmic ointment Place 1 application into the right eye at bedtime.   Yes [provider]  fluticasone (FLONASE) 50 MCG/ACT nasal spray Place 1 spray into both nostrils daily.    Yes [provider]  guaifenesin (ROBAFEN) 100 MG/5ML syrup Take 200 mg by mouth 3 (three) times daily as needed for cough.   Yes [provider]  hydrocortisone 2.5 % cream Apply 1 application topically 2 (two) times daily as needed (itching).    Yes [provider]  Ipratropium-Albuterol (COMBIVENT RESPIMAT) 20-100 MCG/ACT AERS respimat Inhale 1 puff into the lungs every 6 (six) hours as needed for wheezing or shortness of breath.   Yes [provider]  isosorbide mononitrate (IMDUR) 30 MG 24 hr tablet Take 30 mg by mouth daily.  08/22/16  Yes [provider]  midodrine (PROAMATINE) 10 MG tablet Take 10 mg by mouth every 8 (eight) hours.    Yes [provider]  Multiple Vitamins-Minerals (THERA-M PO) Take 1 tablet by mouth 2 (two) times daily.   Yes [provider]  nitroGLYCERIN (NITROSTAT) 0.4 MG SL tablet Place 0.4 mg under the tongue every 5 (five) minutes as needed for chest pain.   Yes [provider]  pantoprazole (PROTONIX) 20 MG tablet Take 20 mg by mouth daily.   Yes [provider]  polyethylene glycol (MIRALAX / GLYCOLAX) packet Take 17 g by mouth daily as needed for mild constipation.    Yes [provider]  pregabalin (LYRICA) 75 MG capsule Take 1 capsule (75 mg total) by  mouth daily. 01/22/17  Yes Gladstone Lighter, MD  sevelamer carbonate (RENVELA) 800 MG tablet Take 800 mg by mouth 3 (three) times daily with meals.    Yes [provider]  tiotropium (SPIRIVA) 18 MCG inhalation capsule Place 18 mcg into inhaler and inhale daily.   Yes [provider]  traMADol (ULTRAM) 50 MG tablet Take 75 mg by mouth every 8 (eight) hours as needed (pain).   Yes [provider]    Inpatient Medications:  . [START ON 08/11/2018] aspirin EC  81 mg Oral Daily  . atorvastatin  80 mg Oral q1800  . carvedilol  6.25 mg Oral BID  . Chlorhexidine Gluconate Cloth  6 each Topical Q0600  . erythromycin  1 application Right Eye  QHS  . fluticasone  1 spray Each Nare Daily  . isosorbide mononitrate  30 mg Oral Daily  . loratadine  10 mg Oral Daily  . midodrine  10 mg Oral Q8H  . multivitamin with minerals  1 tablet Oral BID  . pregabalin  75 mg Oral Daily  . tiotropium  18 mcg Inhalation Daily   . sodium chloride    . heparin 1,000 Units/hr (08/10/18 0451)  . nitroGLYCERIN    . norepinephrine (LEVOPHED) Adult infusion    . pantoprozole (PROTONIX) infusion 8 mg/hr (08/10/18 0503)    Allergies:  Allergies  Allergen Reactions  . Dust Mite Extract Other (See Comments)    Reaction: unknown    Social History   Socioeconomic History  . Marital status: Widowed    Spouse name: Not on file  . Number of children: Not on file  . Years of education: Not on file  . Highest education level: Not on file  Occupational History  . Not on file  Social Needs  . Financial resource strain: Not on file  . Food insecurity:    Worry: Not on file    Inability: Not on file  . Transportation needs:    Medical: Not on file    Non-medical: Not on file  Tobacco Use  . Smoking status: Light Tobacco Smoker    Packs/day: 0.25    Years: 30.00    Pack years: 7.50    Types: Cigarettes  . Smokeless tobacco: Never Used  . Tobacco comment: 5  Substance and Sexual  Activity  . Alcohol use: No    Alcohol/week: 0.0 standard drinks  . Drug use: No    Comment: has used crack cocaine in past   . Sexual activity: Not Currently    Birth control/protection: Abstinence  Lifestyle  . Physical activity:    Days per week: Not on file    Minutes per session: Not on file  . Stress: Not on file  Relationships  . Social connections:    Talks on phone: Not on file    Gets together: Not on file    Attends religious service: Not on file    Active member of club or organization: Not on file    Attends meetings of clubs or organizations: Not on file    Relationship status: Not on file  . Intimate partner violence:    Fear of current or ex partner: Not on file    Emotionally abused: Not on file    Physically abused: Not on file    Forced sexual activity: Not on file  Other Topics Concern  . Not on file  Social History Narrative   Engaged.      Family History  Problem Relation Age of Onset  . Leukemia Mother   . Heart attack Father   . Heart failure Other   . Hypertension Other   . Leukemia Other   . Diabetes Other   . Prostate cancer Neg Hx   . Kidney cancer Neg Hx   . Bladder Cancer Neg Hx      Review of Systems Positive for shortness of breath chest pain Negative for: General:  chills, fever, night sweats or weight changes.  Cardiovascular: PND orthopnea syncope dizziness  Dermatological skin lesions rashes Respiratory: Cough congestion Urologic: Frequent urination urination at night and hematuria Abdominal: negative for nausea, vomiting, diarrhea, bright red blood per rectum, melena, or hematemesis Neurologic: negative for visual changes, and/or hearing changes  All other systems reviewed  and are otherwise negative except as noted above.  Labs: Recent Labs    08/10/18 0243  TROPONINI 1.89*   Lab Results  Component Value Date   WBC 8.9 08/10/2018   HGB 7.6 (L) 08/10/2018   HCT 24.2 (L) 08/10/2018   MCV 100.4 (H) 08/10/2018   PLT  209 08/10/2018    Recent Labs  Lab 08/10/18 0243  NA 138  K 4.1  CL 103  CO2 21*  BUN 67*  CREATININE 10.25*  CALCIUM 9.1  PROT 6.6  BILITOT 0.3  ALKPHOS 82  ALT 29  AST 35  GLUCOSE 131*   Lab Results  Component Value Date   CHOL 131 07/26/2016   HDL 45 07/26/2016   LDLCALC 47 07/26/2016   TRIG 196 (H) 07/26/2016   No results found for: DDIMER  Radiology/Studies:  Dg Chest Portable 1 View  Result Date: 08/10/2018 CLINICAL DATA:  Chest pain EXAM: PORTABLE CHEST 1 VIEW COMPARISON:  04/24/2018 FINDINGS: Cardiomegaly. Bilateral airspace disease with small bilateral effusions. Findings likely reflect edema/CHF. No acute bony abnormality. IMPRESSION: Cardiomegaly with bilateral effusions and diffuse bilateral airspace disease, likely edema/CHF. Electronically Signed   By: Rolm Baptise M.D.   On: 08/10/2018 03:09    EKG: Normal sinus rhythm with left bundle branch block  Weights: Filed Weights   08/10/18 0238 08/10/18 0650  Weight: 95.3 kg 95.1 kg     Physical Exam: Blood pressure 110/71, pulse 74, temperature 97.8 F (36.6 C), temperature source Oral, resp. rate 16, height 5\' 8"  (1.727 m), weight 95.1 kg, SpO2 98 %. Body mass index is 31.88 kg/m. General: Well developed, well nourished, in no acute distress. Head eyes ears nose throat: Normocephalic, atraumatic, sclera non-icteric, no xanthomas, nares are without discharge. No apparent thyromegaly and/or mass  Lungs: Normal respiratory effort.  n some wheezes, basilar rales, no rhonchi.  Heart: RRR with normal S1 S2. no murmur gallop, no rub, PMI is normal size and placement, carotid upstroke normal without bruit, jugular venous pressure is normal Abdomen: Soft, non-tender, non-distended with normoactive bowel sounds. No hepatomegaly. No rebound/guarding. No obvious abdominal masses. Abdominal aorta is normal size without bruit Extremities: Trace to 1+ edema. no cyanosis, no clubbing, no ulcers  Peripheral : 2+  bilateral upper extremity pulses, 2+ bilateral femoral pulses, 2+ bilateral dorsal pedal pulse Neuro: Alert and oriented. No facial asymmetry. No focal deficit. Moves all extremities spontaneously. Musculoskeletal: Normal muscle tone without kyphosis Psych:  Responds to questions appropriately with a normal affect.    Assessment: 63 year old male with cardiomyopathy after previous myocardial infarction PCI and stent placement chronic kidney disease stage V with dialysis GI bleed with anemia diabetes sleep apnea and acute on chronic systolic dysfunction heart failure and additional non-ST elevation myocardial infarction needing further medication management and treatment options  Plan: 1.  Heparin for further risk reduction of non-ST elevation myocardial infarction 2.  Discontinuation of Eliquis at this time for further concerns of recent bleeding anemia 3.  Nitrates as able for chest discomfort and myocardial ischemia watching closely for hypotension 4.  Further investigation of possible GI bleed and treatment of anemia with packed red blood cells as able 5.  Repeat echocardiogram for LV systolic dysfunction valvular heart disease and extent of heart failure 6.  Possible need for repeat dialysis for pulmonary edema pleural effusions 7.  After stabilization of above and would consider cardiac catheterization to assess coronary anatomy and further treatment thereof is necessary.  Patient understands the risk and benefits of cardiac  catheterization.  This includes a possibility death stroke heart attack infection bleeding or blood clot.  He is at low risk for conscious sedation  Signed, Corey Skains M.D. [facility] 08/10/2018, 7:48 AM

## 2018-08-10 NOTE — Progress Notes (Signed)
Pre HD Tx    08/10/18 1519  Hand-Off documentation  Report given to (Full Name) Beatris Ship, RN   Report received from (Full Name) Charyl Bigger, RN   Vital Signs  Temp 97.6 F (36.4 C)  Temp Source Oral  Pulse Rate 73  Pulse Rate Source Monitor  Resp 20  BP 97/70  BP Location Right Arm  BP Method Automatic  Patient Position (if appropriate) Lying  Oxygen Therapy  SpO2 98 %  O2 Device Nasal Cannula  O2 Flow Rate (L/min) 2 L/min  Pulse Oximetry Type Continuous  Pain Assessment  Pain Scale 0-10  Pain Score 0  Dialysis Weight  Weight 95.1 kg  Type of Weight Pre-Dialysis  Time-Out for Hemodialysis  What Procedure? HD  Pt Identifiers(min of two) First/Last Name;MRN/Account#  Correct Site? Yes  Correct Side? Yes  Correct Procedure? Yes  Consents Verified? Yes  Rad Studies Available? N/A  Safety Precautions Reviewed? Yes  Engineer, civil (consulting) Number 1  Station Number  (Beside ICU 14)  UF/Alarm Test Passed  Conductivity: Meter 14  Conductivity: Machine  13.9  pH 7.4  Reverse Osmosis Portable   Normal Saline Lot Number [id number]  Dialyzer Lot Number [id number]  Disposable Set Lot Number [id number]  Machine Temperature 98.6 F (37 C)  Musician and Audible Yes  Blood Lines Intact and Secured Yes  Pre Treatment Patient Checks  Vascular access used during treatment Fistula  Hemodialysis Consent Verified Yes  Hemodialysis Standing Orders Initiated Yes  ECG (Telemetry) Monitor On Yes  Prime Ordered Normal Saline  Length of  DialysisTreatment -hour(s) 3 Hour(s)  Dialysis Treatment Comments Na 140  Dialyzer Elisio 17H NR  Dialysate 2K, 2.5 Ca  Dialysis Anticoagulant None  Dialysate Flow Ordered 800  Blood Flow Rate Ordered 400 mL/min  Ultrafiltration Goal 2 Liters  Dialysis Blood Pressure Support Ordered Normal Saline  Education / Care Plan  Dialysis Education Provided Yes  Documented Education in Care Plan Yes  Fistula / Graft Left Forearm  Placement  Date/Time: (c) 01/19/17 0150   Placed prior to admission: Yes  Orientation: Left  Access Location: Forearm  Site Condition No complications  Fistula / Graft Assessment Present;Thrill;Bruit  Status Patent

## 2018-08-10 NOTE — Progress Notes (Signed)
[facility] for heparin Indication: chest pain/ACS  Allergies  Allergen Reactions  . Dust Mite Extract Other (See Comments)    Reaction: unknown    Patient Measurements: Height: 5\' 8"  (172.7 cm) Weight: 204 lb 9.4 oz (92.8 kg) IBW/kg (Calculated) : 68.4 Heparin Dosing Weight: 90.4 kg  Vital Signs: Temp: 97.8 F (36.6 C) (03/16 1800) Temp Source: Oral (03/16 1800) BP: 109/69 (03/16 1900) Pulse Rate: 74 (03/16 1900)  Labs: Recent Labs    08/10/18 0243 08/10/18 0415 08/10/18 1340 08/10/18 1802  HGB 7.6*  --   --   --   HCT 24.2*  --   --   --   PLT 209  --   --   --   APTT  --  36 102* 98*  LABPROT  --  15.9*  --   --   INR  --  1.3*  --   --   HEPARINUNFRC  --  >3.60*  --   --   CREATININE 10.25*  --   --   --   TROPONINI 1.89*  --   --   --     Estimated Creatinine Clearance: 8.1 mL/min (A) (by C-G formula based on SCr of 10.25 mg/dL (H)).   Medical History: Past Medical History:  Diagnosis Date  . 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 Amputee, below knee, left ([place])   . Anemia   . Asthma   . Cardiomyopathy ([place])   . CHF (congestive heart failure) ([place])   . Chronic systolic heart failure ([place])   . Complication of anesthesia    hypotension  . COPD (chronic obstructive pulmonary disease) ([place])   . Coronary artery disease   . Dialysis patient ([place])    Mon, Wed, Fri  . End stage renal disease ([place])   . GERD (gastroesophageal reflux disease)   . Headache   . History of kidney stones   . History of pulmonary embolism   . HLD (hyperlipidemia)   . HTN (hypertension)   . Hyperparathyroidism   . Myocardial infarction ([place])   . Peripheral vascular disease ([place])   . Shortness of breath dyspnea   . Sleep apnea    NO C-PAP, Patient stated in process of  "getting one"   . Tobacco dependence     Medications:  Scheduled:  . [START ON 08/11/2018] aspirin EC  81 mg Oral Daily  . atorvastatin  80 mg Oral q1800   . carvedilol  6.25 mg Oral BID  . Chlorhexidine Gluconate Cloth  6 each Topical Q0600  . Chlorhexidine Gluconate Cloth  6 each Topical Q0600  . erythromycin  1 application Right Eye QHS  . fluticasone  1 spray Each Nare Daily  . isosorbide mononitrate  30 mg Oral Daily  . loratadine  10 mg Oral Daily  . mouth rinse  15 mL Mouth Rinse BID  . midodrine  10 mg Oral Q8H  . multivitamin  1 tablet Oral QHS  . mupirocin ointment  1 application Nasal BID  . pregabalin  75 mg Oral Daily  . tiotropium  18 mcg Inhalation Daily  . vitamin C  500 mg Oral BID    Assessment: Patient admitted for CP from nursing facility, trops 1.89. Patient takes eliquis 5 mg bid PTA (unclear why) and unsure when last dose was.  Patient is being started on heparin drip for UA/ACS Baseline labs show HL >3.6, hgb 7.6, PT/INR 15.9/1.3  Goal of  Therapy:  APTT 66 - 102 seconds Heparin level 0.3-0.7 units/ml Monitor platelets by anticoagulation protocol: Yes   Plan:  APTT remains at  high end of range. Continue heparin 1000 units/hr. Will obtain follow up APTT with am labs.  Will obtain anti-Xa levels daily until APTT and anti-Xa levels correlate.   Pharmacy will continue to monitor and adjust per consult.   MLS 08/10/2018

## 2018-08-10 NOTE — Progress Notes (Signed)
Post HD    08/10/18 1900  Hand-Off documentation  Report given to (Full Name) Charyl Bigger, RN   Report received from (Full Name) Beatris Ship, RN   Vital Signs  Pulse Rate 74  Pulse Rate Source Monitor  Resp 12  BP 109/69  BP Location Right Arm  BP Method Automatic  Patient Position (if appropriate) Lying  Oxygen Therapy  SpO2 100 %  O2 Device Nasal Cannula  O2 Flow Rate (L/min) 2 L/min  Pulse Oximetry Type Continuous  Dialysis Weight  Weight 92.8 kg  Type of Weight Post-Dialysis  Post-Hemodialysis Assessment  Rinseback Volume (mL) 250 mL  KECN 68.8 V  Dialyzer Clearance Lightly streaked  Duration of HD Treatment -hour(s) 3 hour(s)  Hemodialysis Intake (mL) 500 mL  UF Total -Machine (mL) 2500 mL  Net UF (mL) 2000 mL  Tolerated HD Treatment Yes  AVG/AVF Arterial Site Held (minutes) 10 minutes  AVG/AVF Venous Site Held (minutes) 10 minutes  Fistula / Graft Left Upper arm Arteriovenous fistula  No Placement Date or Time found.   Placed prior to admission: Yes  Orientation: Left  Access Location: Upper arm  Access Type: Arteriovenous fistula  Site Condition No complications  Fistula / Graft Assessment Present;Thrill;Bruit  Status Deaccessed  Drainage Description None

## 2018-08-10 NOTE — H&P (Signed)
[facility] NAME: [name]    MR#:  [id number]  DATE OF BIRTH:  [birth year]  DATE OF ADMISSION:  08/10/2018  PRIMARY CARE PHYSICIAN: Donnie Coffin, MD   REQUESTING/REFERRING PHYSICIAN: Brenton Grills, MD  CHIEF COMPLAINT:   Chief Complaint  Patient presents with  . Chest Pain    HISTORY OF PRESENT ILLNESS:  [name]  is a 63 y.o. male with a known history of multiple medical problems that will be mentioned below including end-stage renal disease on hemodialysis on Friday, CHF, anemia, COPD, coronary artery disease, who presented to the emergency room with acute onset of midsternal midsternal chest pain, moderate in intensity and described as ripping pain with associated nausea and diaphoresis without vomiting, radiating to his left arm.  He has been having dyspnea and wheezing without cough or hemoptysis.  He denied any abdominal pain or melena or bright red bleeding per rectum.  No bilious vomitus or hematemesis.  He has not missed hemodialysis.  No recent travels or surgeries or sick exposure.  No exposure to large crowds.  He was hypotensive upon arrival of EMS with a blood pressure of 70/50.  It has come up when he came to the ER.  Upon presentation to the emergency room, blood pressure was 114/84 with otherwise normal vital signs.  EKG showed normal sinus rhythm with a rate of 88 with left bundle branch block.  Labs were remarkable for a troponin I of 1.89.  Troponin I was 0.22 on 08/04/2018.  The patient's hemoglobin and hematocrit tonight were 7.6 and 24.2.  Platelets were 209 and WBC 8.9.  INR was 1.3 with a PT of 15.9 PTT of 36.  Portable chest x-ray revealed Cardiomegaly with bilateral effusions diffuse bilateral airspace disease likely edema.  The patient was given IV heparin bolus and drip, 1 mg of IV Dilaudid and 4 mg IV morphine sulfate as well as started on IV nitroglycerin drip for persistent pain.  When his stool  Hemoccult came back positive I recommended a bolus of 80 mg IV Protonix followed by IV Protonix drip that was given.  A cardiology consultation over the phone was obtained by Dr. Nehemiah Massed as well as a GI consultation by Dr. Marius Ditch.  The patient will be admitted to a stepdown unit for further evaluation and management. PAST MEDICAL HISTORY:   Past Medical History:  Diagnosis Date  . 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 Amputee, below knee, left ([place])   . Anemia   . Asthma   . Cardiomyopathy ([place])   . CHF (congestive heart failure) ([place])   . Chronic systolic heart failure ([place])   . Complication of anesthesia    hypotension  . COPD (chronic obstructive pulmonary disease) ([place])   . Coronary artery disease   . Dialysis patient ([place])    Mon, Wed, Fri  . End stage renal disease ([place])   . GERD (gastroesophageal reflux disease)   . Headache   . History of kidney stones   . History of pulmonary embolism   . HLD (hyperlipidemia)   . HTN (hypertension)   . Hyperparathyroidism   . Myocardial infarction ([place])   . Peripheral vascular disease ([place])   . Shortness of breath dyspnea   . Sleep apnea    NO C-PAP, Patient stated in process of  "getting one"   . Tobacco dependence     PAST SURGICAL HISTORY:   Past Surgical  History:  Procedure Laterality Date  . A/V FISTULAGRAM Left 08/14/2017   Procedure: A/V FISTULAGRAM;  Surgeon: Algernon Huxley, MD;  Location: [facility];  Service: Cardiovascular;  Laterality: Left;  . AMPUTATION Left 05/06/2014   Procedure: AMPUTATION BELOW KNEE;  Surgeon: Elam Dutch, MD;  Location: [facility];  Service: Vascular;  Laterality: Left;  . AMPUTATION Right 01/12/2015   Procedure: Foot transmetatarsal amputation;  Surgeon: Algernon Huxley, MD;  Location: [facility];  Service: Vascular;  Laterality: Right;  . APPLICATION OF WOUND VAC Right 03/01/2015   Procedure: Application of Bio-connekt graft and wound vac application to right foot ;  Surgeon:  Algernon Huxley, MD;  Location: [facility];  Service: Vascular;  Laterality: Right;  . AV FISTULA PLACEMENT Left   . AV FISTULA PLACEMENT Left 11/28/2016   Procedure: ARTERIOVENOUS (AV) FISTULA CREATION;  Surgeon: Algernon Huxley, MD;  Location: [facility];  Service: Vascular;  Laterality: Left;  . CARDIAC CATHETERIZATION     stent placement   . CORONARY ANGIOPLASTY    . DIALYSIS/PERMA CATHETER INSERTION N/A 07/31/2016   Procedure: Dialysis/Perma Catheter Insertion;  Surgeon: Algernon Huxley, MD;  Location: [facility];  Service: Cardiovascular;  Laterality: N/A;  . DIALYSIS/PERMA CATHETER INSERTION N/A 07/25/2017   Procedure: DIALYSIS/PERMA CATHETER INSERTION and fistulagram;  Surgeon: Algernon Huxley, MD;  Location: [facility];  Service: Cardiovascular;  Laterality: N/A;  . DIALYSIS/PERMA CATHETER REMOVAL N/A 07/22/2017   Procedure: DIALYSIS/PERMA CATHETER REMOVAL;  Surgeon: Katha Cabal, MD;  Location: [facility];  Service: Cardiovascular;  Laterality: N/A;  . DIALYSIS/PERMA CATHETER REMOVAL N/A 02/05/2018   Procedure: DIALYSIS/PERMA CATHETER REMOVAL;  Surgeon: Algernon Huxley, MD;  Location: [facility];  Service: Cardiovascular;  Laterality: N/A;  . IR FLUORO GUIDE CV LINE LEFT  04/10/2017  . LIGATION OF ARTERIOVENOUS  FISTULA Right 01/31/2016   Procedure: LIGATION OF ARTERIOVENOUS  FISTULA;  Surgeon: Algernon Huxley, MD;  Location: [facility];  Service: Vascular;  Laterality: Right;  . PERIPHERAL VASCULAR CATHETERIZATION Right 12/15/2014   Procedure: Lower Extremity Angiography;  Surgeon: Algernon Huxley, MD;  Location: [facility];  Service: Cardiovascular;  Laterality: Right;  . PERIPHERAL VASCULAR CATHETERIZATION  12/15/2014   Procedure: Lower Extremity Intervention;  Surgeon: Algernon Huxley, MD;  Location: [facility];  Service: Cardiovascular;;  . PERIPHERAL VASCULAR CATHETERIZATION Right 08/14/2015   Procedure: A/V Shuntogram/Fistulagram;  Surgeon: Algernon Huxley, MD;   Location: [facility];  Service: Cardiovascular;  Laterality: Right;  . PERIPHERAL VASCULAR CATHETERIZATION N/A 08/14/2015   Procedure: A/V Shunt Intervention;  Surgeon: Algernon Huxley, MD;  Location: [facility];  Service: Cardiovascular;  Laterality: N/A;  . PERIPHERAL VASCULAR CATHETERIZATION N/A 01/11/2016   Procedure: Dialysis/Perma Catheter Insertion;  Surgeon: Algernon Huxley, MD;  Location: [facility];  Service: Cardiovascular;  Laterality: N/A;  . REVISON OF ARTERIOVENOUS FISTULA Right 02/17/2016   Procedure: removal of AV fistula;  Surgeon: Serafina Mitchell, MD;  Location: [facility];  Service: Vascular;  Laterality: Right;  . REVISON OF ARTERIOVENOUS FISTULA Right 01/31/2016   Procedure: REVISON OF ARTERIOVENOUS FISTULA ( BRACHIOCEPHALIC ) W/ ARTEGRAFT;  Surgeon: Algernon Huxley, MD;  Location: [facility];  Service: Vascular;  Laterality: Right;  . TRANSMETATARSAL AMPUTATION Right 05/04/2015   Procedure: TRANSMETATARSAL AMPUTATION REVISION, great toe amputation;  Surgeon: Algernon Huxley, MD;  Location: [facility];  Service: Vascular;  Laterality:  Right;    SOCIAL HISTORY:   Social History   Tobacco Use  . Smoking status: Light Tobacco Smoker    Packs/day: 0.25    Years: 30.00    Pack years: 7.50    Types: Cigarettes  . Smokeless tobacco: Never Used  . Tobacco comment: 5  Substance Use Topics  . Alcohol use: No    Alcohol/week: 0.0 standard drinks    FAMILY HISTORY:   Family History  Problem Relation Age of Onset  . Leukemia Mother   . Heart attack Father   . Heart failure Other   . Hypertension Other   . Leukemia Other   . Diabetes Other   . Prostate cancer Neg Hx   . Kidney cancer Neg Hx   . Bladder Cancer Neg Hx     DRUG ALLERGIES:   Allergies  Allergen Reactions  . Dust Mite Extract Other (See Comments)    Reaction: unknown    REVIEW OF SYSTEMS:   ROS As per history of present illness. All pertinent systems were reviewed above. Constitutional,   HEENT, cardiovascular, respiratory, GI, GU, musculoskeletal, neuro, psychiatric, endocrine,  integumentary and hematologic systems were reviewed and are otherwise  negative/unremarkable except for positive findings mentioned above in the HPI.   MEDICATIONS AT HOME:   Prior to Admission medications   Medication Sig Start Date End Date Taking? Authorizing Provider  acetaminophen (TYLENOL) 500 MG tablet Take 500 mg by mouth every 6 (six) hours as needed for mild pain.    Yes [provider]  albuterol (PROVENTIL HFA;VENTOLIN HFA) 108 (90 Base) MCG/ACT inhaler Inhale 2 puffs into the lungs every 6 (six) hours as needed for wheezing or shortness of breath.    Yes [provider]  albuterol (PROVENTIL) (2.5 MG/3ML) 0.083% nebulizer solution Take 3 mLs (2.5 mg total) by nebulization every 6 (six) hours as needed for wheezing or shortness of breath. 04/24/18  Yes Gladstone Lighter, MD  apixaban (ELIQUIS) 5 MG TABS tablet Take 1 tablet (5 mg total) by mouth 2 (two) times daily. 01/20/17  Yes Gladstone Lighter, MD  aspirin EC 325 MG tablet Take 325 mg by mouth daily.    Yes [provider]  atorvastatin (LIPITOR) 10 MG tablet Take 10 mg by mouth at bedtime.    Yes [provider]  budesonide-formoterol (SYMBICORT) 160-4.5 MCG/ACT inhaler Inhale 2 puffs into the lungs 2 (two) times daily.   Yes [provider]  calcium acetate (PHOSLO) 667 MG capsule Take 667 mg by mouth daily.    Yes [provider]  carvedilol (COREG) 6.25 MG tablet Take 1 tablet (6.25 mg total) by mouth 2 (two) times daily. 10/07/17  Yes Hackney, Otila Kluver A, FNP  cetirizine (ZYRTEC) 10 MG tablet Take 10 mg by mouth daily.   Yes [provider]  docusate sodium (COLACE) 100 MG capsule Take 100 mg by mouth daily as needed for mild constipation or moderate constipation.    Yes [provider]  erythromycin ophthalmic ointment Place 1 application into the right eye at  bedtime.   Yes [provider]  fluticasone (FLONASE) 50 MCG/ACT nasal spray Place 1 spray into both nostrils daily.    Yes [provider]  guaifenesin (ROBAFEN) 100 MG/5ML syrup Take 200 mg by mouth 3 (three) times daily as needed for cough.   Yes [provider]  hydrocortisone 2.5 % cream Apply 1 application topically 2 (two) times daily as needed (itching).    Yes [provider]  Ipratropium-Albuterol (COMBIVENT RESPIMAT) 20-100 MCG/ACT AERS respimat Inhale 1 puff into the lungs every 6 (six) hours as needed for wheezing or shortness of breath.   Yes [provider]  isosorbide mononitrate (IMDUR) 30 MG 24 hr tablet Take 30 mg by mouth daily.  08/22/16  Yes [provider]  midodrine (PROAMATINE) 10 MG tablet Take 10 mg by mouth every 8 (eight) hours.    Yes [provider]  Multiple Vitamins-Minerals (THERA-M PO) Take 1 tablet by mouth 2 (two) times daily.   Yes [provider]  nitroGLYCERIN (NITROSTAT) 0.4 MG SL tablet Place 0.4 mg under the tongue every 5 (five) minutes as needed for chest pain.   Yes [provider]  pantoprazole (PROTONIX) 20 MG tablet Take 20 mg by mouth daily.   Yes [provider]  polyethylene glycol (MIRALAX / GLYCOLAX) packet Take 17 g by mouth daily as needed for mild constipation.    Yes [provider]  pregabalin (LYRICA) 75 MG capsule Take 1 capsule (75 mg total) by mouth daily. 01/22/17  Yes Gladstone Lighter, MD  sevelamer carbonate (RENVELA) 800 MG tablet Take 800 mg by mouth 3 (three) times daily with meals.    Yes [provider]  tiotropium (SPIRIVA) 18 MCG inhalation capsule Place 18 mcg into inhaler and inhale daily.   Yes [provider]  traMADol (ULTRAM) 50 MG tablet Take 75 mg by mouth every 8 (eight) hours as needed (pain).   Yes [provider]      VITAL SIGNS:  Blood pressure 109/77, pulse 80, temperature (!) 97.5 F  (36.4 C), temperature source Oral, resp. rate (!) 8, height 5\' 9"  (1.753 m), weight 95.3 kg, SpO2 100 %.  PHYSICAL EXAMINATION:  Physical Exam  GENERAL:  63 y.o.-year-old African-American male patient lying in the bed with mild conversational dyspnea. EYES: Pupils equal, round, reactive to light and accommodation. No scleral icterus. Extraocular muscles intact.  HEENT: Head atraumatic, normocephalic. Oropharynx and nasopharynx clear.  NECK:  Supple, no jugular venous distention. No thyroid enlargement, no tenderness.  LUNGS: Diminished bibasilar breath sounds with mild bibasilar rales.  No use of accessory muscles of respiration.  CARDIOVASCULAR: Regular rate and rhythm, S1, S2 normal. No murmurs, rubs, or gallops.  ABDOMEN: Soft, nontender, nondistended. Bowel sounds present. No organomegaly or mass.  EXTREMITIES: He has right ankle and leg 1+ edema.  He status post left below-knee amputation and as well as right forefoot amputation NEUROLOGIC: Cranial nerves II through XII are intact. Muscle strength 5/5 in all extremities. Sensation intact. Gait not checked.  PSYCHIATRIC: The patient is alert and oriented x 3.  SKIN: No obvious rash, lesion, or ulcer.   LABORATORY PANEL:   CBC Recent Labs  Lab 08/10/18 0243  WBC 8.9  HGB 7.6*  HCT 24.2*  PLT 209   ------------------------------------------------------------------------------------------------------------------  Chemistries  Recent Labs  Lab 08/10/18 0243  NA 138  K 4.1  CL 103  CO2 21*  GLUCOSE 131*  BUN 67*  CREATININE 10.25*  CALCIUM 9.1  AST 35  ALT 29  ALKPHOS 82  BILITOT 0.3   ------------------------------------------------------------------------------------------------------------------  Cardiac Enzymes Recent Labs  Lab 08/10/18 0243  TROPONINI 1.89*   ------------------------------------------------------------------------------------------------------------------  RADIOLOGY:  Dg Chest Portable 1  View  Result Date: 08/10/2018 CLINICAL DATA:  Chest pain EXAM: PORTABLE CHEST 1 VIEW COMPARISON:  04/24/2018 FINDINGS: Cardiomegaly. Bilateral airspace disease with small bilateral effusions. Findings likely reflect edema/CHF. No acute bony abnormality. IMPRESSION: Cardiomegaly with bilateral effusions and diffuse  bilateral airspace disease, likely edema/CHF. Electronically Signed   By: Rolm Baptise M.D.   On: 08/10/2018 03:09      IMPRESSION AND PLAN:  #1.  None-ST elevation MI.The patient will be admitted to a stepdown bed.  We will continue the patient on IV heparin per Dr. Alveria Apley recommendation while transfusing the patient 2 units of packed red blood cells for his anemia. Will follow serial cardiac enzymes and EKGs.  The patient will be continued on IV nitroglycerin drip as well as PRN IV morphine sulfate.  We will hold off further aspirin at this time as the patient was given a total of 650 mg of p.o. aspirin.  I contacted Dr. Nehemiah Massed regarding the patient.  We will check his fasting lipid as well as place on high-dose statin therapy.  Beta-blocker therapy has been provided with his Coreg.  Unfortunately we are holding off Eliquis in addition to aspirin given his GI bleeding.  We are holding off his Symbicort as well due to long-acting beta agonist detrimental effect with tachycardia increasing his myocardial oxygen demand and consumption.  2D echo will be obtained.  2.  GI bleeding with acute blood loss anemia.  This is likely acute on top of chronic anemia of end-stage renal disease.  The patient had a strongly positive stool Hemoccult.  Will follow hemoglobin hematocrit levels.  He was typed and crossmatch and will be transfused 2 units of packed red blood cells.  Dr. Nehemiah Massed is aware about his GI bleeding and given his acute non-STEMI recommended IV heparin while transfusing the patient.  Dr. Marius Ditch will be consulted and she is aware about the patient.  I contacted her as well.  The patient  will be continued on IV Protonix drip after giving bolus.  His aspirin and Eliquis will be held off.  3.  End-stage renal disease on hemodialysis likely with associated fluid overload could be related to acute on top of chronic systolic CHF.  Nephrology consultation to Dr. Candiss Norse will be obtained for follow-up.  I contacted him as well.  His PhosLo and Renvela will be continued.  2D echo will be obtained.  The patient's last echo on 07/23/2017 showed an EF of 20 to 25% with akinesis of the anterior myocardium as well as an anteroseptal myocardium, mild mitral regurgitation mild left atrial dilatation as well as right atrial dilatation.  Cardiology consultation will be obtained as mentioned above.  4.  COPD.  The Spiriva will be continued and he will be placed on continued care and Combivent.  5.  Dyslipidemia.  Lipitor dose will be increased as mentioned above.  6.  Hypertension.  His Coreg will be resumed.  We will monitor his blood pressure given usual hypotension.  7.  DVT prophylaxis.  This is covered with IV heparin and GI prophylaxis was addressed above pain of the    All the records are reviewed and case discussed with ED provider.  I also contacted the [facility] intensivist Dr. Genevive Bi regarding the patient.   The plan of care was discussed in details with the patient (and family). I answered all questions. The patient agreed to proceed with the above mentioned plan. Further management will depend upon hospital course.  His prognosis is guarded.   CODE STATUS: Full code.  TOTAL TIME TAKING CARE OF THIS PATIENT: 65 minutes.    Christel Mormon M.D on 08/10/2018 at 5:48 AM  Pager - [phone]  After 6pm go to [email or web address] - [place]  [facility]  Office  [phone]  CC: Primary care physician; Donnie Coffin, MD  This dictation has been created by [employer].  Any transcription errors that resulted from this  process are unintentional.

## 2018-08-10 NOTE — Consult Note (Signed)
Lucilla Lame, MD [address] [place], [place] [address] Phone: [phone] Fax : [phone]  Consultation  Referring Provider:     Dr. Sidney Ace Primary Care Physician:  Donnie Coffin, MD Primary Gastroenterologist: Althia Forts         Reason for Consultation:     Anemia  Date of Admission:  08/10/2018 Date of Consultation:  08/10/2018         HPI:   [name] is a 63 y.o. male who comes in with a report of a non-ST elevated MI.  The patient reports that he had a colonoscopy back many years ago and on record it appears that he had two tubular disease in October 2014 by Dr. Rayann Heman. The patient was admitted with chest pain and has a history of multiple medical problems including end-stage renal disease on hemodialysis.  The patient also has a history of anemia CHF and COPD.  The patient also is on coagulation.  The patient has been diagnosed with chronic systolic dysfunction with an ejection fraction of 25%. The patient was admitted with chest pain and weakness.  The patient had a chest x-ray showing some pulmonary edema as well as a pleural effusion consistent with his congestive heart failure.  The patient's hemoglobin on admission was 7.6 with a previous hemoglobin 3 months ago of 12.1.  The patient does report that he has some bright red blood per rectum when he moves his bowels but denies any profuse rectal bleeding abdominal pain black stools or blood mixed with the stools.  Past Medical History:  Diagnosis Date  . 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 Amputee, below knee, left ([place])   . Anemia   . Asthma   . Cardiomyopathy ([place])   . CHF (congestive heart failure) ([place])   . Chronic systolic heart failure ([place])   . Complication of anesthesia    hypotension  . COPD (chronic obstructive pulmonary disease) ([place])   . Coronary artery disease   . Dialysis patient ([place])    Mon, Wed, Fri  . End stage renal disease ([place])   . GERD (gastroesophageal  reflux disease)   . Headache   . History of kidney stones   . History of pulmonary embolism   . HLD (hyperlipidemia)   . HTN (hypertension)   . Hyperparathyroidism   . Myocardial infarction ([place])   . Peripheral vascular disease ([place])   . Shortness of breath dyspnea   . Sleep apnea    NO C-PAP, Patient stated in process of  "getting one"   . Tobacco dependence     Past Surgical History:  Procedure Laterality Date  . A/V FISTULAGRAM Left 08/14/2017   Procedure: A/V FISTULAGRAM;  Surgeon: Algernon Huxley, MD;  Location: [facility];  Service: Cardiovascular;  Laterality: Left;  . AMPUTATION Left 05/06/2014   Procedure: AMPUTATION BELOW KNEE;  Surgeon: Elam Dutch, MD;  Location: [facility];  Service: Vascular;  Laterality: Left;  . AMPUTATION Right 01/12/2015   Procedure: Foot transmetatarsal amputation;  Surgeon: Algernon Huxley, MD;  Location: [facility];  Service: Vascular;  Laterality: Right;  . APPLICATION OF WOUND VAC Right 03/01/2015   Procedure: Application of Bio-connekt graft and wound vac application to right foot ;  Surgeon: Algernon Huxley, MD;  Location: [facility];  Service: Vascular;  Laterality: Right;  . AV FISTULA PLACEMENT Left   . AV FISTULA PLACEMENT Left 11/28/2016   Procedure: ARTERIOVENOUS (  AV) FISTULA CREATION;  Surgeon: Algernon Huxley, MD;  Location: [facility];  Service: Vascular;  Laterality: Left;  . CARDIAC CATHETERIZATION     stent placement   . CORONARY ANGIOPLASTY    . DIALYSIS/PERMA CATHETER INSERTION N/A 07/31/2016   Procedure: Dialysis/Perma Catheter Insertion;  Surgeon: Algernon Huxley, MD;  Location: [facility];  Service: Cardiovascular;  Laterality: N/A;  . DIALYSIS/PERMA CATHETER INSERTION N/A 07/25/2017   Procedure: DIALYSIS/PERMA CATHETER INSERTION and fistulagram;  Surgeon: Algernon Huxley, MD;  Location: [facility];  Service: Cardiovascular;  Laterality: N/A;  . DIALYSIS/PERMA CATHETER REMOVAL N/A 07/22/2017   Procedure: DIALYSIS/PERMA CATHETER  REMOVAL;  Surgeon: Katha Cabal, MD;  Location: [facility];  Service: Cardiovascular;  Laterality: N/A;  . DIALYSIS/PERMA CATHETER REMOVAL N/A 02/05/2018   Procedure: DIALYSIS/PERMA CATHETER REMOVAL;  Surgeon: Algernon Huxley, MD;  Location: [facility];  Service: Cardiovascular;  Laterality: N/A;  . IR FLUORO GUIDE CV LINE LEFT  04/10/2017  . LIGATION OF ARTERIOVENOUS  FISTULA Right 01/31/2016   Procedure: LIGATION OF ARTERIOVENOUS  FISTULA;  Surgeon: Algernon Huxley, MD;  Location: [facility];  Service: Vascular;  Laterality: Right;  . PERIPHERAL VASCULAR CATHETERIZATION Right 12/15/2014   Procedure: Lower Extremity Angiography;  Surgeon: Algernon Huxley, MD;  Location: [facility];  Service: Cardiovascular;  Laterality: Right;  . PERIPHERAL VASCULAR CATHETERIZATION  12/15/2014   Procedure: Lower Extremity Intervention;  Surgeon: Algernon Huxley, MD;  Location: [facility];  Service: Cardiovascular;;  . PERIPHERAL VASCULAR CATHETERIZATION Right 08/14/2015   Procedure: A/V Shuntogram/Fistulagram;  Surgeon: Algernon Huxley, MD;  Location: [facility];  Service: Cardiovascular;  Laterality: Right;  . PERIPHERAL VASCULAR CATHETERIZATION N/A 08/14/2015   Procedure: A/V Shunt Intervention;  Surgeon: Algernon Huxley, MD;  Location: [facility];  Service: Cardiovascular;  Laterality: N/A;  . PERIPHERAL VASCULAR CATHETERIZATION N/A 01/11/2016   Procedure: Dialysis/Perma Catheter Insertion;  Surgeon: Algernon Huxley, MD;  Location: [facility];  Service: Cardiovascular;  Laterality: N/A;  . REVISON OF ARTERIOVENOUS FISTULA Right 02/17/2016   Procedure: removal of AV fistula;  Surgeon: Serafina Mitchell, MD;  Location: [facility];  Service: Vascular;  Laterality: Right;  . REVISON OF ARTERIOVENOUS FISTULA Right 01/31/2016   Procedure: REVISON OF ARTERIOVENOUS FISTULA ( BRACHIOCEPHALIC ) W/ ARTEGRAFT;  Surgeon: Algernon Huxley, MD;  Location: [facility];  Service: Vascular;  Laterality:  Right;  . TRANSMETATARSAL AMPUTATION Right 05/04/2015   Procedure: TRANSMETATARSAL AMPUTATION REVISION, great toe amputation;  Surgeon: Algernon Huxley, MD;  Location: [facility];  Service: Vascular;  Laterality: Right;    Prior to Admission medications   Medication Sig Start Date End Date Taking? Authorizing Provider  acetaminophen (TYLENOL) 500 MG tablet Take 500 mg by mouth every 6 (six) hours as needed for mild pain.    Yes [provider]  albuterol (PROVENTIL HFA;VENTOLIN HFA) 108 (90 Base) MCG/ACT inhaler Inhale 2 puffs into the lungs every 6 (six) hours as needed for wheezing or shortness of breath.    Yes [provider]  albuterol (PROVENTIL) (2.5 MG/3ML) 0.083% nebulizer solution Take 3 mLs (2.5 mg total) by nebulization every 6 (six) hours as needed for wheezing or shortness of breath. 04/24/18  Yes Gladstone Lighter, MD  apixaban (ELIQUIS) 5 MG TABS tablet Take 1 tablet (5 mg total) by mouth 2 (two) times daily. 01/20/17  Yes Gladstone Lighter, MD  aspirin EC 325 MG  tablet Take 325 mg by mouth daily.    Yes [provider]  atorvastatin (LIPITOR) 10 MG tablet Take 10 mg by mouth at bedtime.    Yes [provider]  budesonide-formoterol (SYMBICORT) 160-4.5 MCG/ACT inhaler Inhale 2 puffs into the lungs 2 (two) times daily.   Yes [provider]  calcium acetate (PHOSLO) 667 MG capsule Take 667 mg by mouth daily.    Yes [provider]  carvedilol (COREG) 6.25 MG tablet Take 1 tablet (6.25 mg total) by mouth 2 (two) times daily. 10/07/17  Yes Hackney, Otila Kluver A, FNP  cetirizine (ZYRTEC) 10 MG tablet Take 10 mg by mouth daily.   Yes [provider]  docusate sodium (COLACE) 100 MG capsule Take 100 mg by mouth daily as needed for mild constipation or moderate constipation.    Yes [provider]  erythromycin ophthalmic ointment Place 1 application into the right eye at bedtime.   Yes [provider]  fluticasone  (FLONASE) 50 MCG/ACT nasal spray Place 1 spray into both nostrils daily.    Yes [provider]  guaifenesin (ROBAFEN) 100 MG/5ML syrup Take 200 mg by mouth 3 (three) times daily as needed for cough.   Yes [provider]  hydrocortisone 2.5 % cream Apply 1 application topically 2 (two) times daily as needed (itching).    Yes [provider]  Ipratropium-Albuterol (COMBIVENT RESPIMAT) 20-100 MCG/ACT AERS respimat Inhale 1 puff into the lungs every 6 (six) hours as needed for wheezing or shortness of breath.   Yes [provider]  isosorbide mononitrate (IMDUR) 30 MG 24 hr tablet Take 30 mg by mouth daily.  08/22/16  Yes [provider]  midodrine (PROAMATINE) 10 MG tablet Take 10 mg by mouth every 8 (eight) hours.    Yes [provider]  Multiple Vitamins-Minerals (THERA-M PO) Take 1 tablet by mouth 2 (two) times daily.   Yes [provider]  nitroGLYCERIN (NITROSTAT) 0.4 MG SL tablet Place 0.4 mg under the tongue every 5 (five) minutes as needed for chest pain.   Yes [provider]  pantoprazole (PROTONIX) 20 MG tablet Take 20 mg by mouth daily.   Yes [provider]  polyethylene glycol (MIRALAX / GLYCOLAX) packet Take 17 g by mouth daily as needed for mild constipation.    Yes [provider]  pregabalin (LYRICA) 75 MG capsule Take 1 capsule (75 mg total) by mouth daily. 01/22/17  Yes Gladstone Lighter, MD  sevelamer carbonate (RENVELA) 800 MG tablet Take 800 mg by mouth 3 (three) times daily with meals.    Yes [provider]  tiotropium (SPIRIVA) 18 MCG inhalation capsule Place 18 mcg into inhaler and inhale daily.   Yes [provider]  traMADol (ULTRAM) 50 MG tablet Take 75 mg by mouth every 8 (eight) hours as needed (pain).   Yes [provider]    Family History  Problem Relation Age of Onset  . Leukemia Mother   . Heart attack Father   . Heart failure Other   .  Hypertension Other   . Leukemia Other   . Diabetes Other   . Prostate cancer Neg Hx   . Kidney cancer Neg Hx   . Bladder Cancer Neg Hx      Social History   Tobacco Use  . Smoking status: Light Tobacco Smoker    Packs/day: 0.25    Years: 30.00    Pack years: 7.50    Types: Cigarettes  . Smokeless  tobacco: Never Used  . Tobacco comment: 5  Substance Use Topics  . Alcohol use: No    Alcohol/week: 0.0 standard drinks  . Drug use: No    Comment: has used crack cocaine in past     Allergies as of 08/10/2018 - Review Complete 08/10/2018  Allergen Reaction Noted  . Dust mite extract Other (See Comments) 03/01/2015    Review of Systems:    All systems reviewed and negative except where noted in HPI.   Physical Exam:  Vital signs in last 24 hours: Temp:  [97.4 F (36.3 C)-97.9 F (36.6 C)] 97.4 F (36.3 C) (03/16 1246) Pulse Rate:  [69-88] 71 (03/16 1300) Resp:  [8-19] 11 (03/16 1300) BP: (89-120)/(63-84) 99/66 (03/16 1300) SpO2:  [92 %-100 %] 98 % (03/16 1300) Weight:  [95.1 kg-95.3 kg] 95.1 kg (03/16 0650) Last BM Date: 08/09/18 General:   Pleasant, cooperative in NAD Head:  Normocephalic and atraumatic. Eyes:   No icterus.   Conjunctiva pink. PERRLA. Ears:  Normal auditory acuity. Neck:  Supple; no masses or thyroidomegaly Lungs: Respirations even and unlabored. Lungs clear to auscultation bilaterally.   No wheezes, crackles, or rhonchi.  Heart:  Regular rate and rhythm;  Without murmur, clicks, rubs or gallops Abdomen:  Soft, nondistended, nontender. Normal bowel sounds. No appreciable masses or hepatomegaly.  No rebound or guarding.  Rectal:  Not performed. Msk:  Symmetrical without gross deformities.    Extremities:  Without edema, cyanosis or clubbing. Neurologic:  Alert and oriented x3;  grossly normal neurologically. Skin:  Intact without significant lesions or rashes. Cervical Nodes:  No significant cervical adenopathy. Psych:  Alert and cooperative. Normal  affect.  LAB RESULTS: Recent Labs    08/10/18 0243  WBC 8.9  HGB 7.6*  HCT 24.2*  PLT 209   BMET Recent Labs    08/10/18 0243  NA 138  K 4.1  CL 103  CO2 21*  GLUCOSE 131*  BUN 67*  CREATININE 10.25*  CALCIUM 9.1   LFT Recent Labs    08/10/18 0243  PROT 6.6  ALBUMIN 3.0*  AST 35  ALT 29  ALKPHOS 82  BILITOT 0.3   PT/INR Recent Labs    08/10/18 0415  LABPROT 15.9*  INR 1.3*    STUDIES: Dg Chest Portable 1 View  Result Date: 08/10/2018 CLINICAL DATA:  Chest pain EXAM: PORTABLE CHEST 1 VIEW COMPARISON:  04/24/2018 FINDINGS: Cardiomegaly. Bilateral airspace disease with small bilateral effusions. Findings likely reflect edema/CHF. No acute bony abnormality. IMPRESSION: Cardiomegaly with bilateral effusions and diffuse bilateral airspace disease, likely edema/CHF. Electronically Signed   By: Rolm Baptise M.D.   On: 08/10/2018 03:09      Impression / Plan:   Assessment: Active Problems:   Non-STEMI (non-ST elevated myocardial infarction) ([place])   [name] is a 63 y.o. y/o male with significant anemia with a MCV slightly higher than normal.  The patient has had some bright red blood per rectum and has a history of colon polyps at his last colonoscopy 6 years ago.  The patient will need cardiac clearance prior to any endoscopic procedures being planned.  I would recommend transfusing the patient as needed and following hemoglobin.  Plan: I will await cardiology clearance prior to discussing any endoscopic intervention on this patient with a non-ST elevated myocardial infarction.  If cleared by cardiology then I will discuss with the patient the possibility of looking for source of his anemia.  The patient has been explained the plan and  agrees with it.  Thank you for involving me in the care of this patient.      LOS: 0 days   Lucilla Lame, MD  08/10/2018, 3:18 PM    Note: This dictation was prepared with Dragon dictation along with smaller phrase  technology. Any transcriptional errors that result from this process are unintentional.

## 2018-08-10 NOTE — Progress Notes (Signed)
ANTICOAGULATION CONSULT NOTE - Initial Consult  Pharmacy Consult for heparin Indication: chest pain/ACS  Allergies  Allergen Reactions  . Dust Mite Extract Other (See Comments)    Reaction: unknown    Patient Measurements: Height: 5\' 9"  (175.3 cm) Weight: 210 lb (95.3 kg) IBW/kg (Calculated) : 70.7 Heparin Dosing Weight: 90.4 kg  Vital Signs: Temp: 97.5 F (36.4 C) (03/16 0237) Temp Source: Oral (03/16 0237) BP: 109/77 (03/16 0533) Pulse Rate: 80 (03/16 0533)  Labs: Recent Labs    08/10/18 0243 08/10/18 0415  HGB 7.6*  --   HCT 24.2*  --   PLT 209  --   APTT  --  36  LABPROT  --  15.9*  INR  --  1.3*  HEPARINUNFRC  --  >3.60*  CREATININE 10.25*  --   TROPONINI 1.89*  --     Estimated Creatinine Clearance: 8.3 mL/min (A) (by C-G formula based on SCr of 10.25 mg/dL (H)).   Medical History: Past Medical History:  Diagnosis Date  . 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 Amputee, below knee, left ([place])   . Anemia   . Asthma   . Cardiomyopathy ([place])   . CHF (congestive heart failure) ([place])   . Chronic systolic heart failure ([place])   . Complication of anesthesia    hypotension  . COPD (chronic obstructive pulmonary disease) ([place])   . Coronary artery disease   . Dialysis patient ([place])    Mon, Wed, Fri  . End stage renal disease ([place])   . GERD (gastroesophageal reflux disease)   . Headache   . History of kidney stones   . History of pulmonary embolism   . HLD (hyperlipidemia)   . HTN (hypertension)   . Hyperparathyroidism   . Myocardial infarction ([place])   . Peripheral vascular disease ([place])   . Shortness of breath dyspnea   . Sleep apnea    NO C-PAP, Patient stated in process of  "getting one"   . Tobacco dependence     Medications:  Scheduled:  . atorvastatin  10 mg Oral QHS  . calcium acetate  667 mg Oral Q breakfast  . carvedilol  6.25 mg Oral BID  . erythromycin  1 application Right Eye QHS  . fluticasone  1 spray Each Nare Daily   . isosorbide mononitrate  30 mg Oral Daily  . loratadine  10 mg Oral Daily  . midodrine  10 mg Oral Q8H  . multivitamin with minerals  1 tablet Oral BID  . pregabalin  75 mg Oral Daily  . sevelamer carbonate  800 mg Oral TID WC  . tiotropium  18 mcg Inhalation Daily    Assessment: Patient admitted for CP from nursing facility, trops 1.89. Patient takes eliquis 5 mg bid PTA (unclear why) and unsure when last dose was.  Patient is being started on heparin drip for UA/ACS Baseline labs show HL >3.6, hgb 7.6, PT/INR 15.9/1.3  Goal of Therapy:  APTT 66 - 102 seconds Heparin level 0.3-0.7 units/ml Monitor platelets by anticoagulation protocol: Yes   Plan:  Patient appears to be having a GI bleed as well; for this reason will omit bolus. Will start rate a bit conservatively at 1000 units/hr Since baseline HL is elevated will dose by aPTTs. Will check aPTT/HL @ 1100. Will monitor daily CBC's and adjust per aPTTs until both are therapeutic and correlating, then will dose off of anti-Xa levels.  Tobie Lords, PharmD, BCPS Clinical Pharmacist 08/10/2018

## 2018-08-10 NOTE — Progress Notes (Signed)
Post HD assessment    08/10/18 1850  Neurological  Level of Consciousness Alert  Orientation Level Oriented X4  Respiratory  Respiratory Pattern Regular;Unlabored  Chest Assessment Chest expansion symmetrical  Bilateral Breath Sounds Diminished  Cough None  Cardiac  Pulse Regular  Heart Sounds S1, S2  ECG Monitor Yes  Cardiac Rhythm NSR  Vascular  R Radial Pulse +2  L Radial Pulse +2  Edema Generalized  Generalized Edema +1  Psychosocial  Psychosocial (WDL) WDL

## 2018-08-10 NOTE — ED Triage Notes (Signed)
Pt arrives via [facility] with c/o intermittent chest pain x 2 weeks. Pt took nitro x 1 before EMS arrival on the scene.EMS reports that pt wears O2 2-3 L PRN at night but was 88% on RA with EMS. Pt otherwise has normal VS for EMS upon arrival. Pt arrives from [facility] and EMS also administered 324 mg aspirin. Pt is hurting at this time.

## 2018-08-10 NOTE — ED Provider Notes (Addendum)
[facility] Emergency Department Provider Note  ____________________________________________  Time seen: Approximately 3:49 AM  I have reviewed the triage vital signs and the nursing notes.   HISTORY  Chief Complaint Chest Pain    HPI [name] is a 63 y.o. male with a history of anemia, CHF, CAD, end-stage renal disease on hemodialysis, COPD who comes the ED complaining of central chest pain, sharp, intermittent for the past 2 weeks.  Radiates to left shoulder and left arm.  Not exertional, not pleuritic.  Associated with shortness of breath.  Improved by taking nitroglycerin at home, but tonight it did not get better after taking nitroglycerin and EMS report that on arrival they found a blood pressure of 70/50.  During transport this blood pressure came back up to normal as his nitroglycerin tablet wore off.  They gave 324 mg of aspirin.  They note patient had room air oxygen saturation of 88% so they put him on 3 L nasal cannula oxygen that he is ordered for as needed at home.      Past Medical History:  Diagnosis Date  . 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 Amputee, below knee, left ([place])   . Anemia   . Asthma   . Cardiomyopathy ([place])   . CHF (congestive heart failure) ([place])   . Chronic systolic heart failure ([place])   . Complication of anesthesia    hypotension  . COPD (chronic obstructive pulmonary disease) ([place])   . Coronary artery disease   . Dialysis patient ([place])    Mon, Wed, Fri  . End stage renal disease ([place])   . GERD (gastroesophageal reflux disease)   . Headache   . History of kidney stones   . History of pulmonary embolism   . HLD (hyperlipidemia)   . HTN (hypertension)   . Hyperparathyroidism   . Myocardial infarction ([place])   . Peripheral vascular disease ([place])   . Shortness of breath dyspnea   . Sleep apnea    NO C-PAP, Patient stated in process of  "getting one"   . Tobacco dependence      Patient Active  Problem List   Diagnosis Date Noted  . HCAP (healthcare-associated pneumonia) 04/22/2018  . Acute on chronic respiratory failure with hypoxia ([place]) 12/21/2017  . Tobacco use 10/07/2017  . Apnea   . End-stage renal disease on hemodialysis ([place])   . Hypervolemia   . Altered mental status   . Acute on chronic systolic CHF (congestive heart failure) ([place]) 01/18/2017  . Pulmonary embolism ([place]) 08/03/2016  . NSTEMI (non-ST elevated myocardial infarction) ([place]) 07/26/2016  . PVD (peripheral vascular disease) ([place]) 03/05/2016  . Preop examination 05/02/2015  . Chronic systolic heart failure ([place])   . Hyperkalemia 01/13/2015  . Normocytic anemia 05/04/2014  . Hyperlipidemia 04/26/2010  . HYPERTENSION, BENIGN 04/26/2010  . CAD, NATIVE VESSEL 04/26/2010     Past Surgical History:  Procedure Laterality Date  . A/V FISTULAGRAM Left 08/14/2017   Procedure: A/V FISTULAGRAM;  Surgeon: Algernon Huxley, MD;  Location: [place] [facility];  Service: Cardiovascular;  Laterality: Left;  . AMPUTATION Left 05/06/2014   Procedure: AMPUTATION BELOW KNEE;  Surgeon: Elam Dutch, MD;  Location: [place];  Service: Vascular;  Laterality: Left;  . AMPUTATION Right 01/12/2015   Procedure: Foot transmetatarsal amputation;  Surgeon: Algernon Huxley, MD;  Location: [facility];  Service: Vascular;  Laterality: Right;  . APPLICATION OF WOUND VAC Right 03/01/2015   Procedure:  Application of Bio-connekt graft and wound vac application to right foot ;  Surgeon: Algernon Huxley, MD;  Location: [facility];  Service: Vascular;  Laterality: Right;  . AV FISTULA PLACEMENT Left   . AV FISTULA PLACEMENT Left 11/28/2016   Procedure: ARTERIOVENOUS (AV) FISTULA CREATION;  Surgeon: Algernon Huxley, MD;  Location: [facility];  Service: Vascular;  Laterality: Left;  . CARDIAC CATHETERIZATION     stent placement   . CORONARY ANGIOPLASTY    . DIALYSIS/PERMA CATHETER INSERTION N/A 07/31/2016   Procedure: Dialysis/Perma Catheter Insertion;  Surgeon:  Algernon Huxley, MD;  Location: [place] [facility];  Service: Cardiovascular;  Laterality: N/A;  . DIALYSIS/PERMA CATHETER INSERTION N/A 07/25/2017   Procedure: DIALYSIS/PERMA CATHETER INSERTION and fistulagram;  Surgeon: Algernon Huxley, MD;  Location: [place] [facility];  Service: Cardiovascular;  Laterality: N/A;  . DIALYSIS/PERMA CATHETER REMOVAL N/A 07/22/2017   Procedure: DIALYSIS/PERMA CATHETER REMOVAL;  Surgeon: Katha Cabal, MD;  Location: [place] [facility];  Service: Cardiovascular;  Laterality: N/A;  . DIALYSIS/PERMA CATHETER REMOVAL N/A 02/05/2018   Procedure: DIALYSIS/PERMA CATHETER REMOVAL;  Surgeon: Algernon Huxley, MD;  Location: [place] [facility];  Service: Cardiovascular;  Laterality: N/A;  . IR FLUORO GUIDE CV LINE LEFT  04/10/2017  . LIGATION OF ARTERIOVENOUS  FISTULA Right 01/31/2016   Procedure: LIGATION OF ARTERIOVENOUS  FISTULA;  Surgeon: Algernon Huxley, MD;  Location: [facility];  Service: Vascular;  Laterality: Right;  . PERIPHERAL VASCULAR CATHETERIZATION Right 12/15/2014   Procedure: Lower Extremity Angiography;  Surgeon: Algernon Huxley, MD;  Location: [place] [facility];  Service: Cardiovascular;  Laterality: Right;  . PERIPHERAL VASCULAR CATHETERIZATION  12/15/2014   Procedure: Lower Extremity Intervention;  Surgeon: Algernon Huxley, MD;  Location: [place] [facility];  Service: Cardiovascular;;  . PERIPHERAL VASCULAR CATHETERIZATION Right 08/14/2015   Procedure: A/V Shuntogram/Fistulagram;  Surgeon: Algernon Huxley, MD;  Location: [place] [facility];  Service: Cardiovascular;  Laterality: Right;  . PERIPHERAL VASCULAR CATHETERIZATION N/A 08/14/2015   Procedure: A/V Shunt Intervention;  Surgeon: Algernon Huxley, MD;  Location: [place] [facility];  Service: Cardiovascular;  Laterality: N/A;  . PERIPHERAL VASCULAR CATHETERIZATION N/A 01/11/2016   Procedure: Dialysis/Perma Catheter Insertion;  Surgeon: Algernon Huxley, MD;  Location: [place] [facility];  Service: Cardiovascular;   Laterality: N/A;  . REVISON OF ARTERIOVENOUS FISTULA Right 02/17/2016   Procedure: removal of AV fistula;  Surgeon: Serafina Mitchell, MD;  Location: [facility];  Service: Vascular;  Laterality: Right;  . REVISON OF ARTERIOVENOUS FISTULA Right 01/31/2016   Procedure: REVISON OF ARTERIOVENOUS FISTULA ( BRACHIOCEPHALIC ) W/ ARTEGRAFT;  Surgeon: Algernon Huxley, MD;  Location: [facility];  Service: Vascular;  Laterality: Right;  . TRANSMETATARSAL AMPUTATION Right 05/04/2015   Procedure: TRANSMETATARSAL AMPUTATION REVISION, great toe amputation;  Surgeon: Algernon Huxley, MD;  Location: [facility];  Service: Vascular;  Laterality: Right;     Prior to Admission medications   Medication Sig Start Date End Date Taking? Authorizing Provider  acetaminophen (TYLENOL) 500 MG tablet Take 500 mg by mouth every 6 (six) hours as needed for mild pain.     [provider]  albuterol (PROVENTIL HFA;VENTOLIN HFA) 108 (90 Base) MCG/ACT inhaler Inhale 2 puffs into the lungs every 6 (six) hours as needed for wheezing or shortness of breath.     [provider]  albuterol (PROVENTIL) (2.5 MG/3ML) 0.083% nebulizer solution Take 3 mLs (2.5 mg total) by nebulization every  6 (six) hours as needed for wheezing or shortness of breath. 04/24/18   Gladstone Lighter, MD  apixaban (ELIQUIS) 5 MG TABS tablet Take 1 tablet (5 mg total) by mouth 2 (two) times daily. 01/20/17   Gladstone Lighter, MD  aspirin EC 325 MG tablet Take 325 mg by mouth daily.     [provider]  atorvastatin (LIPITOR) 10 MG tablet Take 10 mg by mouth at bedtime.     [provider]  budesonide-formoterol (SYMBICORT) 160-4.5 MCG/ACT inhaler Inhale 2 puffs into the lungs 2 (two) times daily.    [provider]  calcium acetate (PHOSLO) 667 MG capsule Take 667 mg by mouth daily.     [provider]  carvedilol (COREG) 6.25 MG tablet Take 1 tablet (6.25 mg total) by mouth 2 (two) times daily. 10/07/17   Alisa Graff, FNP   cetirizine (ZYRTEC) 10 MG tablet Take 10 mg by mouth daily.    [provider]  docusate sodium (COLACE) 100 MG capsule Take 100 mg by mouth daily as needed for mild constipation or moderate constipation.     [provider]  erythromycin ophthalmic ointment Place 1 application into the right eye at bedtime.    [provider]  fluticasone (FLONASE) 50 MCG/ACT nasal spray Place 1 spray into both nostrils daily.     [provider]  guaifenesin (ROBAFEN) 100 MG/5ML syrup Take 200 mg by mouth 3 (three) times daily as needed for cough.    [provider]  hydrocortisone 2.5 % cream Apply 1 application topically 2 (two) times daily as needed (itching).     [provider]  Ipratropium-Albuterol (COMBIVENT RESPIMAT) 20-100 MCG/ACT AERS respimat Inhale 1 puff into the lungs every 6 (six) hours as needed for wheezing or shortness of breath.    [provider]  isosorbide mononitrate (IMDUR) 30 MG 24 hr tablet Take 30 mg by mouth daily.  08/22/16   [provider]  midodrine (PROAMATINE) 10 MG tablet Take 10 mg by mouth every 8 (eight) hours.     [provider]  Multiple Vitamins-Minerals (THERA-M PO) Take 1 tablet by mouth 2 (two) times daily.    [provider]  nitroGLYCERIN (NITROSTAT) 0.4 MG SL tablet Place 0.4 mg under the tongue every 5 (five) minutes as needed for chest pain.    [provider]  pantoprazole (PROTONIX) 20 MG tablet Take 20 mg by mouth daily.    [provider]  polyethylene glycol (MIRALAX / GLYCOLAX) packet Take 17 g by mouth daily as needed for mild constipation.     [provider]  pregabalin (LYRICA) 75 MG capsule Take 1 capsule (75 mg total) by mouth daily. 01/22/17   Gladstone Lighter, MD  sevelamer carbonate (RENVELA) 800 MG tablet Take 800 mg by mouth 3 (three) times daily with meals.     [provider]  tiotropium (SPIRIVA) 18 MCG inhalation  capsule Place 18 mcg into inhaler and inhale daily.    [provider]  traMADol (ULTRAM) 50 MG tablet Take 75 mg by mouth every 8 (eight) hours as needed (pain).    [provider]     Allergies Dust mite extract   Family History  Problem Relation Age of Onset  . Leukemia Mother   . Heart attack Father   . Heart failure Other   . Hypertension Other   . Leukemia Other   . Diabetes Other   . Prostate cancer Neg Hx   .  Kidney cancer Neg Hx   . Bladder Cancer Neg Hx     Social History Social History   Tobacco Use  . Smoking status: Light Tobacco Smoker    Packs/day: 0.25    Years: 30.00    Pack years: 7.50    Types: Cigarettes  . Smokeless tobacco: Never Used  . Tobacco comment: 5  Substance Use Topics  . Alcohol use: No    Alcohol/week: 0.0 standard drinks  . Drug use: No    Comment: has used crack cocaine in past     Review of Systems  Constitutional:   No fever or chills.  ENT:   No sore throat. No rhinorrhea. Cardiovascular: Positive as above chest pain syncope. Respiratory:   Positive shortness of breath without cough. Gastrointestinal:   Negative for abdominal pain, vomiting and diarrhea.  Musculoskeletal:   Negative for focal pain or swelling All other systems reviewed and are negative except as documented above in ROS and HPI.  ____________________________________________   PHYSICAL EXAM:  VITAL SIGNS: ED Triage Vitals  Enc Vitals Group     BP 08/10/18 0237 114/84     Pulse Rate 08/10/18 0237 88     Resp 08/10/18 0237 19     Temp 08/10/18 0237 (!) 97.5 F (36.4 C)     Temp Source 08/10/18 0237 Oral     SpO2 08/10/18 0237 96 %     Weight 08/10/18 0238 210 lb (95.3 kg)     Height 08/10/18 0238 5\' 9"  (1.753 m)     Head Circumference --      Peak Flow --      Pain Score 08/10/18 0238 10     Pain Loc --      Pain Edu? --      Excl. in [place]? --     Vital signs reviewed, nursing assessments reviewed.   Constitutional:   Alert  and oriented.  Ill-appearing. Eyes:   Conjunctivae are normal. EOMI. PERRL. ENT      Head:   Normocephalic and atraumatic.      Nose:   No congestion/rhinnorhea.       Mouth/Throat:   MMM, no pharyngeal erythema. No peritonsillar mass.       Neck:   No meningismus. Full ROM. Hematological/Lymphatic/Immunilogical:   No cervical lymphadenopathy. Cardiovascular:   RRR. Symmetric bilateral radial and DP pulses.  No murmurs. Cap refill less than 2 seconds. Respiratory:   Normal respiratory effort without tachypnea/retractions.  Bilateral crackles Gastrointestinal:   Soft and nontender. Non distended. There is no CVA tenderness.  No rebound, rigidity, or guarding.  Rectal exam reveals hard formed stool, dark and Hemoccult positive. Musculoskeletal: Status post left BKA.  Left lower extremity stump has a small wound that is healing well.  Normal range of motion in all extremities. No joint effusions.  No lower extremity tenderness.  No edema. Neurologic:   Normal speech and language.  Motor grossly intact. No acute focal neurologic deficits are appreciated.  Skin:    Skin is warm, dry and intact. No rash noted.  No petechiae, purpura, or bullae.  ____________________________________________    LABS (pertinent positives/negatives) (all labs ordered are listed, but only abnormal results are displayed) Labs Reviewed  COMPREHENSIVE METABOLIC PANEL - Abnormal; Notable for the following components:      Result Value   CO2 21 (*)    Glucose, Bld 131 (*)    BUN 67 (*)    Creatinine, Ser 10.25 (*)    Albumin 3.0 (*)  GFR calc non Af Amer 5 (*)    GFR calc Af Amer 6 (*)    All other components within normal limits  LIPASE, BLOOD - Abnormal; Notable for the following components:   Lipase 86 (*)    All other components within normal limits  CBC WITH DIFFERENTIAL/PLATELET - Abnormal; Notable for the following components:   RBC 2.41 (*)    Hemoglobin 7.6 (*)    HCT 24.2 (*)    MCV 100.4 (*)     RDW 15.6 (*)    nRBC 0.5 (*)    Monocytes Absolute 1.1 (*)    All other components within normal limits  TROPONIN I - Abnormal; Notable for the following components:   Troponin I 1.89 (*)    All other components within normal limits  APTT  PROTIME-INR  HEPARIN LEVEL (UNFRACTIONATED)  PREPARE RBC (CROSSMATCH)  TYPE AND SCREEN   ____________________________________________   EKG  Interpreted by me Sinus rhythm rate of 88, left axis, left bundle branch block.  ST depression in 2, aVF, V4 V5 V6, which is new compared to previous EKG on July 22, 2018. Right-sided leads performed which do not show any ST elevation.  Repeat EKG at 3:38 AM interpreted by me Sinus rhythm rate of 88, left bundle branch block, persistent inferolateral ST depressions without evolution.  ____________________________________________    [id number]  Dg Chest Portable 1 View  Result Date: 08/10/2018 CLINICAL DATA:  Chest pain EXAM: PORTABLE CHEST 1 VIEW COMPARISON:  04/24/2018 FINDINGS: Cardiomegaly. Bilateral airspace disease with small bilateral effusions. Findings likely reflect edema/CHF. No acute bony abnormality. IMPRESSION: Cardiomegaly with bilateral effusions and diffuse bilateral airspace disease, likely edema/CHF. Electronically Signed   By: Rolm Baptise M.D.   On: 08/10/2018 03:09    ____________________________________________   PROCEDURES .Critical Care Performed by: Carrie Mew, MD Authorized by: Carrie Mew, MD   Critical care provider statement:    Critical care time (minutes):  35   Critical care time was exclusive of:  Separately billable procedures and treating other patients   Critical care was necessary to treat or prevent imminent or life-threatening deterioration of the following conditions:  Cardiac failure and shock   Critical care was time spent personally by me on the following activities:  Development of treatment plan with patient or surrogate, discussions  with consultants, evaluation of patient's response to treatment, examination of patient, obtaining history from patient or surrogate, ordering and performing treatments and interventions, ordering and review of laboratory studies, ordering and review of radiographic studies, pulse oximetry, re-evaluation of patient's condition and review of old charts    ____________________________________________    CLINICAL IMPRESSION / [place] / ED COURSE  Medications ordered in the ED: Medications  nitroGLYCERIN 50 mg in dextrose 5 % 250 mL (0.2 mg/mL) infusion (30 mcg/min Intravenous Rate/Dose Change 08/10/18 0411)  0.9 %  sodium chloride infusion (has no administration in time range)  heparin ADULT infusion 100 units/mL (25000 units/241mL sodium chloride 0.45%) (has no administration in time range)  pantoprazole (PROTONIX) 80 mg in sodium chloride 0.9 % 100 mL IVPB (has no administration in time range)  pantoprazole (PROTONIX) 80 mg in sodium chloride 0.9 % 250 mL (0.32 mg/mL) infusion (has no administration in time range)  morphine 4 MG/ML injection 4 mg (4 mg Intravenous Given 08/10/18 0252)  ondansetron (ZOFRAN) injection 4 mg (4 mg Intravenous Given 08/10/18 0252)  HYDROmorphone (DILAUDID) injection 1 mg (1 mg Intravenous Given 08/10/18 0413)    Pertinent labs &  imaging results that were available during my care of the patient were reviewed by me and considered in my medical decision making (see chart for details).    Patient presents with accelerating chest pain over the past 2 weeks, now intractable.  Had an episode of hypotension after taking his own nitroglycerin at home.  EKG concerning for inferolateral ST depression, suggestive of non-STEMI/unstable angina.  Right-sided leads negative for STEMI.  Will start nitroglycerin infusion to titrate to chest pain relief while maintaining adequate blood pressure, check labs including troponin.  Plan for hospitalization after initial work-up.   Chest x-ray to evaluate for pulmonary edema versus pneumonia.  Doubt PE dissection or pericardial effusion.  Clinical Course as of Aug 10 703  Mon Aug 10, 2018  0246 Pt had his 325mg  aspirin 3/15 at his facility, also received 324 asa by ems. On eliquis. Start nitro drip, iv morphine.    [PS]  0328 Hemoglobin is 7.6.  Troponin 1.8 up from previous level of 0.2, strongly indicative of non-STEMI.   [PS]  0335 Discussed with Dr. Nehemiah Massed who agrees with transfusion to help improve oxygen supply to the myocardium.  Agrees with starting heparin without bolus given recent Eliquis use, and judicious use of nitroglycerin.   [PS]  0346 Hemoccult positive.  Blood pressure remains normal at 120/80.  Titrated nitroglycerin, start transfusion to which patient verbally consents to me.  Despite GI bleed, will need to start heparin due to ongoing myocardial ischemia.  There are no good options, but the risk of precipitous heart failure necessitates anticoagulation.  Will avoid bolus of heparin and to start on weight-based infusion.   [PS]  0425 Discussed with Dr. Nehemiah Massed again regarding the Hemoccult positive stool.  Due to concern about NSTEMI but also need to anticoagulate if a stent is placed, he wants to optimize patient prior to potential cath.  Recommends continued transfusion and heparin drip without bolus despite possible occult GI bleed.   [PS]    Clinical Course User Index [PS] Carrie Mew, MD     ____________________________________________   FINAL CLINICAL IMPRESSION(S) / ED DIAGNOSES    Final diagnoses:  NSTEMI (non-ST elevated myocardial infarction) ([place])  End-stage renal disease on hemodialysis ([place])  Acute GI bleeding  Symptomatic anemia  Pulmonary edema cardiac cause ([place])  Acute respiratory failure with hypoxia [facility])     ED Discharge Orders    None      Portions of this note were generated with dragon dictation software. Dictation errors may occur despite best  attempts at proofreading.   Carrie Mew, MD 08/10/18 [place], [place], [place] 08/10/18 [id number]

## 2018-08-10 NOTE — Progress Notes (Signed)
HD Tx started w/o complication    37/36/68 1530  Vital Signs  Pulse Rate 76  Pulse Rate Source Monitor  Resp 16  BP (!) 88/65  BP Location Right Arm  BP Method Automatic  Patient Position (if appropriate) Lying  Oxygen Therapy  SpO2 100 %  O2 Device Nasal Cannula  O2 Flow Rate (L/min) 2 L/min  Pulse Oximetry Type Continuous  During Hemodialysis Assessment  Blood Flow Rate (mL/min) 400 mL/min  Arterial Pressure (mmHg) -170 mmHg  Venous Pressure (mmHg) 240 mmHg  Transmembrane Pressure (mmHg) 60 mmHg  Ultrafiltration Rate (mL/min) 830 mL/min  Dialysate Flow Rate (mL/min) 800 ml/min  Conductivity: Machine  14  HD Safety Checks Performed Yes  Dialysis Fluid Bolus Normal Saline  Bolus Amount (mL) 250 mL  Intra-Hemodialysis Comments Tx initiated  Fistula / Graft Left Forearm  Placement Date/Time: (c) 01/19/17 0150   Placed prior to admission: Yes  Orientation: Left  Access Location: Forearm  Status Accessed  Needle Size 15 g  Drainage Description None

## 2018-08-10 NOTE — Progress Notes (Signed)
HD Tx completed, tolerated well, UF goal met.   08/10/18 1830  Vital Signs  Pulse Rate 72  Pulse Rate Source Monitor  Resp 12  BP 94/61  BP Location Right Arm  BP Method Automatic  Patient Position (if appropriate) Lying  Oxygen Therapy  SpO2 97 %  O2 Device Nasal Cannula  O2 Flow Rate (L/min) 2 L/min  Pulse Oximetry Type Continuous  During Hemodialysis Assessment  HD Safety Checks Performed Yes  KECN 68.8 KECN  Dialysis Fluid Bolus Normal Saline  Bolus Amount (mL) 250 mL  Intra-Hemodialysis Comments Tx completed;Tolerated well

## 2018-08-10 NOTE — Progress Notes (Signed)
[facility] NAME: [name]    MR#:  [id number]  DATE OF BIRTH:  1956/04/01  SUBJECTIVE:  CHIEF COMPLAINT:   Chief Complaint  Patient presents with  . Chest Pain   Came with chest pain.  Noted to have low hemoglobin and guaiac positive stool.  Also had elevated troponin so admitted to ICU with borderline blood pressure on heparin drip and currently receiving blood transfusion. REVIEW OF SYSTEMS:  CONSTITUTIONAL: No fever, fatigue or weakness.  EYES: No blurred or double vision.  EARS, NOSE, AND THROAT: No tinnitus or ear pain.  RESPIRATORY: No cough, shortness of breath, wheezing or hemoptysis.  CARDIOVASCULAR: Have chest pain, no orthopnea, edema.  GASTROINTESTINAL: No nausea, vomiting, diarrhea or abdominal pain.  GENITOURINARY: No dysuria, hematuria.  ENDOCRINE: No polyuria, nocturia,  HEMATOLOGY: No anemia, easy bruising or bleeding SKIN: No rash or lesion. MUSCULOSKELETAL: No joint pain or arthritis.   NEUROLOGIC: No tingling, numbness, weakness.  PSYCHIATRY: No anxiety or depression.   ROS  DRUG ALLERGIES:   Allergies  Allergen Reactions  . Dust Mite Extract Other (See Comments)    Reaction: unknown    VITALS:  Blood pressure 109/83, pulse 76, temperature 97.6 F (36.4 C), temperature source Oral, resp. rate 12, height 5\' 8"  (1.727 m), weight 95.1 kg, SpO2 98 %.  PHYSICAL EXAMINATION:  GENERAL:  63 y.o.-year-old patient lying in the bed with no acute distress.  EYES: Pupils equal, round, reactive to light and accommodation. No scleral icterus. Extraocular muscles intact.  HEENT: Head atraumatic, normocephalic. Oropharynx and nasopharynx clear.  NECK:  Supple, no jugular venous distention. No thyroid enlargement, no tenderness.  LUNGS: Normal breath sounds bilaterally, no wheezing, rales,rhonchi or crepitation. No use of accessory muscles of respiration.  CARDIOVASCULAR: S1, S2 normal. No murmurs, rubs, or  gallops.  ABDOMEN: Soft, nontender, nondistended. Bowel sounds present. No organomegaly or mass.  EXTREMITIES: No pedal edema, cyanosis, or clubbing.  NEUROLOGIC: Cranial nerves II through XII are intact. Muscle strength 4/5 in all extremities. Sensation intact. Gait not checked.  PSYCHIATRIC: The patient is alert and oriented x 3.  SKIN: No obvious rash, lesion, or ulcer.   Physical Exam LABORATORY PANEL:   CBC Recent Labs  Lab 08/10/18 0243  WBC 8.9  HGB 7.6*  HCT 24.2*  PLT 209   ------------------------------------------------------------------------------------------------------------------  Chemistries  Recent Labs  Lab 08/10/18 0243  NA 138  K 4.1  CL 103  CO2 21*  GLUCOSE 131*  BUN 67*  CREATININE 10.25*  CALCIUM 9.1  AST 35  ALT 29  ALKPHOS 82  BILITOT 0.3   ------------------------------------------------------------------------------------------------------------------  Cardiac Enzymes Recent Labs  Lab 08/04/18 1305 08/10/18 0243  TROPONINI 0.22* 1.89*   ------------------------------------------------------------------------------------------------------------------  RADIOLOGY:  Dg Chest Portable 1 View  Result Date: 08/10/2018 CLINICAL DATA:  Chest pain EXAM: PORTABLE CHEST 1 VIEW COMPARISON:  04/24/2018 FINDINGS: Cardiomegaly. Bilateral airspace disease with small bilateral effusions. Findings likely reflect edema/CHF. No acute bony abnormality. IMPRESSION: Cardiomegaly with bilateral effusions and diffuse bilateral airspace disease, likely edema/CHF. Electronically Signed   By: Rolm Baptise M.D.   On: 08/10/2018 03:09    ASSESSMENT AND PLAN:   Active Problems:   Non-STEMI (non-ST elevated myocardial infarction) (HCC)   Acute GI bleeding  #1.  None-ST elevation MI. on IV heparin per Dr. Alveria Apley recommendation while transfusing the patient 2 units of packed red blood cells for his anemia.  follow serial cardiac enzymes and EKGs.   on IV  nitroglycerin drip as well as PRN IV morphine sulfate.    holding off Eliquis in addition to aspirin given his GI bleeding.    2D echo will be obtained. Start betablocker and statin. Require cardiac cath once stable as per cardiology.  2.  GI bleeding with acute blood loss anemia.  This is likely acute on top of chronic anemia of end-stage renal disease.  strongly positive stool Hemoccult.    follow hemoglobin hematocrit levels.    transfused 2 units of packed red blood cells.  Dr. Nehemiah Massed is aware about his GI bleeding and given his acute non-STEMI recommended IV heparin while transfusing the patient.    aspirin and Eliquis will be held off. Appreciated GI consult, plan for procedures after cleared from cardiology point.  3.  End-stage renal disease on hemodialysis likely with associated fluid overload could be related to acute on top of chronic systolic CHF.  Nephrology consultation   PhosLo and Renvela will be continued.  2D echo will be obtained.  The patient's last echo on 07/23/2017 showed an EF of 20 to 25% with akinesis of the anterior myocardium as well as an anteroseptal myocardium, mild mitral regurgitation mild left atrial dilatation as well as right atrial dilatation.  Cardiology consultation will be obtained as mentioned above.  4.  COPD.  The Spiriva will be continued and he will be placed on continued care and Combivent.  5.  Dyslipidemia.  Lipitor dose will be increased .  6.  Hypertension.  His Coreg will be resumed.  We will monitor his blood pressure given usual hypotension.  7.  DVT prophylaxis.  This is covered with IV heparin and GI prophylaxis was addressed above pain of the     All the records are reviewed and case discussed with Care Management/Social Workerr. Management plans discussed with the patient, family and they are in agreement.  CODE STATUS: Full  TOTAL TIME TAKING CARE OF THIS PATIENT: 35 minutes.     POSSIBLE D/C IN2-3 DAYS, DEPENDING  ON CLINICAL CONDITION.   Vaughan Basta M.D on 08/10/2018   Between 7am to 6pm - Pager - [phone]  After 6pm go to [email or web address] - password [email or web address] [facility]  Office  [phone]  CC: Primary care physician; Donnie Coffin, MD  Note: This dictation was prepared with Dragon dictation along with smaller phrase technology. Any transcriptional errors that result from this process are unintentional.

## 2018-08-11 ENCOUNTER — Encounter: Payer: Self-pay | Admitting: Pulmonary Disease

## 2018-08-11 ENCOUNTER — Ambulatory Visit: Payer: Medicare Other | Admitting: Physician Assistant

## 2018-08-11 ENCOUNTER — Encounter
Admission: EM | Disposition: A | Discharge status: Skilled Nursing Facility | Payer: Self-pay | Source: Skilled Nursing Facility | Attending: Internal Medicine

## 2018-08-11 DIAGNOSIS — R079 Chest pain, unspecified: Secondary | ICD-10-CM

## 2018-08-11 HISTORY — PX: LEFT HEART CATH AND CORONARY ANGIOGRAPHY: CATH118249

## 2018-08-11 LAB — PROTIME-INR
INR: 1.2 (ref 0.8–1.2)
Prothrombin Time: 14.9 seconds (ref 11.4–15.2)

## 2018-08-11 LAB — BPAM RBC
Blood Product Expiration Date: 202004162359
Blood Product Expiration Date: 202004172359
Blood Product Expiration Date: 202004172359
ISSUE DATE / TIME: 202003161645
ISSUE DATE / TIME: 202003160546
ISSUE DATE / TIME: 202003160958
Unit Type and Rh: 7300
Unit Type and Rh: 7300
Unit Type and Rh: 7300

## 2018-08-11 LAB — TYPE AND SCREEN
ABO/RH(D): B POS
Antibody Screen: NEGATIVE
Unit division: 0
Unit division: 0
Unit division: 0

## 2018-08-11 LAB — BASIC METABOLIC PANEL
Anion gap: 10 (ref 5–15)
BUN: 39 mg/dL — ABNORMAL HIGH (ref 8–23)
CHLORIDE: 103 mmol/L (ref 98–111)
CO2: 27 mmol/L (ref 22–32)
Calcium: 8.9 mg/dL (ref 8.9–10.3)
Creatinine, Ser: 7.3 mg/dL — ABNORMAL HIGH (ref 0.61–1.24)
GFR calc Af Amer: 8 mL/min — ABNORMAL LOW (ref >60)
GFR calc non Af Amer: 7 mL/min — ABNORMAL LOW (ref >60)
Glucose, Bld: 88 mg/dL (ref 70–99)
Potassium: 4.5 mmol/L (ref 3.5–5.1)
SODIUM: 140 mmol/L (ref 135–145)

## 2018-08-11 LAB — LIPID PANEL
CHOL/HDL RATIO: 3 ratio
Cholesterol: 97 mg/dL (ref 0–200)
HDL: 32 mg/dL — ABNORMAL LOW (ref >40)
LDL Cholesterol: 35 mg/dL (ref 0–99)
Triglycerides: 150 mg/dL — ABNORMAL HIGH (ref <150)
VLDL: 30 mg/dL (ref 0–40)

## 2018-08-11 LAB — HEPARIN LEVEL (UNFRACTIONATED): Heparin Unfractionated: 3.16 IU/mL — ABNORMAL HIGH (ref 0.30–0.70)

## 2018-08-11 LAB — CBC
HCT: 29.6 % — ABNORMAL LOW (ref 39.0–52.0)
Hemoglobin: 9.3 g/dL — ABNORMAL LOW (ref 13.0–17.0)
MCH: 30.6 pg (ref 26.0–34.0)
MCHC: 31.4 g/dL (ref 30.0–36.0)
MCV: 97.4 fL (ref 80.0–100.0)
PLATELETS: 205 10*3/uL (ref 150–400)
RBC: 3.04 MIL/uL — ABNORMAL LOW (ref 4.22–5.81)
RDW: 17.1 % — AB (ref 11.5–15.5)
WBC: 10.7 10*3/uL — ABNORMAL HIGH (ref 4.0–10.5)
nRBC: 0.3 % — ABNORMAL HIGH (ref 0.0–0.2)

## 2018-08-11 LAB — APTT
aPTT: 114 seconds — ABNORMAL HIGH (ref 24–36)
aPTT: 40 seconds — ABNORMAL HIGH (ref 24–36)
aPTT: 60 seconds — ABNORMAL HIGH (ref 24–36)

## 2018-08-11 LAB — MRSA PCR SCREENING: MRSA by PCR: POSITIVE — AB

## 2018-08-11 LAB — TROPONIN I: Troponin I: 7.34 ng/mL (ref <0.03)

## 2018-08-11 SURGERY — LEFT HEART CATH AND CORONARY ANGIOGRAPHY
Anesthesia: Moderate Sedation

## 2018-08-11 MED ORDER — SODIUM CHLORIDE 0.9% FLUSH: 3.0000 mL | Freq: Two times a day (BID) | INTRAVENOUS | Status: DC

## 2018-08-11 MED ORDER — MIDAZOLAM HCL 2 MG/2ML IJ SOLN
INTRAMUSCULAR | Status: AC
  Filled 2018-08-11: qty 2

## 2018-08-11 MED ORDER — IOPAMIDOL (ISOVUE-300) INJECTION 61%
INTRAVENOUS | Status: DC | PRN
  Administered 2018-08-11: 100 mL via INTRA_ARTERIAL

## 2018-08-11 MED ORDER — SODIUM CHLORIDE 0.9 % WEIGHT BASED INFUSION: 1.0000 mL/kg/h | INTRAVENOUS | Status: DC

## 2018-08-11 MED ORDER — NITROGLYCERIN 0.4 MG SL SUBL
0.4000 mg | SUBLINGUAL_TABLET | SUBLINGUAL | Status: DC | PRN
  Administered 2018-08-11 – 2018-08-15 (×3): 0.4 mg via SUBLINGUAL
  Filled 2018-08-11: qty 1

## 2018-08-11 MED ORDER — FENTANYL CITRATE (PF) 100 MCG/2ML IJ SOLN
INTRAMUSCULAR | Status: AC
  Filled 2018-08-11: qty 2

## 2018-08-11 MED ORDER — FENTANYL CITRATE (PF) 100 MCG/2ML IJ SOLN
INTRAMUSCULAR | Status: DC | PRN
  Administered 2018-08-11: 25 ug via INTRAVENOUS

## 2018-08-11 MED ORDER — NITROGLYCERIN 0.4 MG SL SUBL
SUBLINGUAL_TABLET | SUBLINGUAL | Status: AC
  Filled 2018-08-11: qty 1

## 2018-08-11 MED ORDER — SODIUM CHLORIDE 0.9 % IV SOLN: 250.0000 mL | INTRAVENOUS | Status: DC | PRN

## 2018-08-11 MED ORDER — SODIUM CHLORIDE 0.9 % WEIGHT BASED INFUSION: 3.0000 mL/kg/h | INTRAVENOUS | Status: DC

## 2018-08-11 MED ORDER — SODIUM CHLORIDE 0.9% FLUSH: 3.0000 mL | INTRAVENOUS | Status: DC | PRN

## 2018-08-11 MED ORDER — MIDAZOLAM HCL 2 MG/2ML IJ SOLN
INTRAMUSCULAR | Status: DC | PRN
  Administered 2018-08-11: 0.5 mg via INTRAVENOUS

## 2018-08-11 MED ORDER — ASPIRIN 81 MG PO CHEW: 81.0000 mg | CHEWABLE_TABLET | ORAL | Status: DC

## 2018-08-11 MED ORDER — MORPHINE SULFATE (PF) 2 MG/ML IV SOLN
2.0000 mg | INTRAVENOUS | Status: DC | PRN
  Administered 2018-08-13 – 2018-08-15 (×2): 2 mg via INTRAVENOUS
  Filled 2018-08-11 (×3): qty 1

## 2018-08-11 MED ORDER — HEPARIN (PORCINE) IN NACL 1000-0.9 UT/500ML-% IV SOLN
INTRAVENOUS | Status: AC
  Filled 2018-08-11: qty 1000

## 2018-08-11 SURGICAL SUPPLY — 9 items
CATH INFINITI 5FR ANG PIGTAIL (CATHETERS) ×3 IMPLANT
CATH INFINITI 5FR JL4 (CATHETERS) ×3 IMPLANT
CATH INFINITI JR4 5F (CATHETERS) ×3 IMPLANT
DEVICE CLOSURE MYNXGRIP 5F (Vascular Products) ×3 IMPLANT
KIT MANI 3VAL PERCEP (MISCELLANEOUS) ×3 IMPLANT
NEEDLE PERC 18GX7CM (NEEDLE) ×3 IMPLANT
PACK CARDIAC CATH (CUSTOM PROCEDURE TRAY) ×3 IMPLANT
SHEATH AVANTI 5FR X 11CM (SHEATH) ×3 IMPLANT
WIRE GUIDERIGHT .035X150 (WIRE) ×3 IMPLANT

## 2018-08-11 NOTE — Progress Notes (Signed)
CRITICAL CARE NOTE        SUBJECTIVE FINDINGS & SIGNIFICANT EVENTS    CP and trop elevation overnight plan for LHC then additional eval from GI.  Code status discussion today: hes currently full code but has paperwork for DNR   PAST MEDICAL HISTORY   Past Medical History:  Diagnosis Date  . 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 Amputee, below knee, left ([place])   . Anemia   . Asthma   . Cardiomyopathy ([place])   . CHF (congestive heart failure) ([place])   . Chronic systolic heart failure ([place])   . Complication of anesthesia    hypotension  . COPD (chronic obstructive pulmonary disease) ([place])   . Coronary artery disease   . Dialysis patient ([place])    Mon, Wed, Fri  . End stage renal disease ([place])   . GERD (gastroesophageal reflux disease)   . Headache   . History of kidney stones   . History of pulmonary embolism   . HLD (hyperlipidemia)   . HTN (hypertension)   . Hyperparathyroidism   . Myocardial infarction ([place])   . Peripheral vascular disease ([place])   . Shortness of breath dyspnea   . Sleep apnea    NO C-PAP, Patient stated in process of  "getting one"   . Tobacco dependence      SURGICAL HISTORY   Past Surgical History:  Procedure Laterality Date  . A/V FISTULAGRAM Left 08/14/2017   Procedure: A/V FISTULAGRAM;  Surgeon: Algernon Huxley, MD;  Location: [facility];  Service: Cardiovascular;  Laterality: Left;  . AMPUTATION Left 05/06/2014   Procedure: AMPUTATION BELOW KNEE;  Surgeon: Elam Dutch, MD;  Location: [facility];  Service: Vascular;  Laterality: Left;  . AMPUTATION Right 01/12/2015   Procedure: Foot transmetatarsal amputation;  Surgeon: Algernon Huxley, MD;  Location: [facility];  Service: Vascular;  Laterality: Right;  . APPLICATION OF WOUND VAC Right 03/01/2015   Procedure: Application of Bio-connekt graft and wound vac application to right foot ;  Surgeon: Algernon Huxley, MD;  Location: [facility];  Service: Vascular;  Laterality: Right;  . AV FISTULA PLACEMENT Left   . AV FISTULA PLACEMENT Left 11/28/2016   Procedure: ARTERIOVENOUS (AV) FISTULA CREATION;  Surgeon: Algernon Huxley, MD;  Location: [facility];  Service: Vascular;  Laterality: Left;  . CARDIAC CATHETERIZATION     stent placement   . CORONARY ANGIOPLASTY    . DIALYSIS/PERMA CATHETER INSERTION N/A 07/31/2016   Procedure: Dialysis/Perma Catheter Insertion;  Surgeon: Algernon Huxley, MD;  Location: [facility];  Service: Cardiovascular;  Laterality: N/A;  . DIALYSIS/PERMA CATHETER INSERTION N/A 07/25/2017   Procedure: DIALYSIS/PERMA CATHETER INSERTION and fistulagram;  Surgeon: Algernon Huxley, MD;  Location: [facility];  Service: Cardiovascular;  Laterality: N/A;  . DIALYSIS/PERMA CATHETER REMOVAL N/A 07/22/2017   Procedure: DIALYSIS/PERMA CATHETER REMOVAL;  Surgeon: Katha Cabal, MD;  Location: [facility];  Service: Cardiovascular;  Laterality: N/A;  . DIALYSIS/PERMA CATHETER REMOVAL N/A 02/05/2018   Procedure: DIALYSIS/PERMA CATHETER REMOVAL;  Surgeon: Algernon Huxley, MD;  Location: [facility];  Service: Cardiovascular;  Laterality: N/A;  . IR FLUORO GUIDE CV LINE LEFT  04/10/2017  . LIGATION OF ARTERIOVENOUS  FISTULA Right 01/31/2016   Procedure: LIGATION OF ARTERIOVENOUS  FISTULA;  Surgeon: Algernon Huxley, MD;  Location: [facility];  Service: Vascular;  Laterality: Right;  . PERIPHERAL VASCULAR CATHETERIZATION Right 12/15/2014  Procedure: Lower Extremity Angiography;  Surgeon: Algernon Huxley, MD;  Location: [facility];  Service: Cardiovascular;  Laterality: Right;  . PERIPHERAL VASCULAR CATHETERIZATION  12/15/2014   Procedure: Lower Extremity Intervention;  Surgeon: Algernon Huxley, MD;  Location: [facility];  Service: Cardiovascular;;  . PERIPHERAL VASCULAR  CATHETERIZATION Right 08/14/2015   Procedure: A/V Shuntogram/Fistulagram;  Surgeon: Algernon Huxley, MD;  Location: [facility];  Service: Cardiovascular;  Laterality: Right;  . PERIPHERAL VASCULAR CATHETERIZATION N/A 08/14/2015   Procedure: A/V Shunt Intervention;  Surgeon: Algernon Huxley, MD;  Location: [facility];  Service: Cardiovascular;  Laterality: N/A;  . PERIPHERAL VASCULAR CATHETERIZATION N/A 01/11/2016   Procedure: Dialysis/Perma Catheter Insertion;  Surgeon: Algernon Huxley, MD;  Location: [facility];  Service: Cardiovascular;  Laterality: N/A;  . REVISON OF ARTERIOVENOUS FISTULA Right 02/17/2016   Procedure: removal of AV fistula;  Surgeon: Serafina Mitchell, MD;  Location: [facility];  Service: Vascular;  Laterality: Right;  . REVISON OF ARTERIOVENOUS FISTULA Right 01/31/2016   Procedure: REVISON OF ARTERIOVENOUS FISTULA ( BRACHIOCEPHALIC ) W/ ARTEGRAFT;  Surgeon: Algernon Huxley, MD;  Location: [facility];  Service: Vascular;  Laterality: Right;  . TRANSMETATARSAL AMPUTATION Right 05/04/2015   Procedure: TRANSMETATARSAL AMPUTATION REVISION, great toe amputation;  Surgeon: Algernon Huxley, MD;  Location: [facility];  Service: Vascular;  Laterality: Right;     FAMILY HISTORY   Family History  Problem Relation Age of Onset  . Leukemia Mother   . Heart attack Father   . Heart failure Other   . Hypertension Other   . Leukemia Other   . Diabetes Other   . Prostate cancer Neg Hx   . Kidney cancer Neg Hx   . Bladder Cancer Neg Hx      SOCIAL HISTORY   Social History   Tobacco Use  . Smoking status: Light Tobacco Smoker    Packs/day: 0.25    Years: 30.00    Pack years: 7.50    Types: Cigarettes  . Smokeless tobacco: Never Used  . Tobacco comment: 5  Substance Use Topics  . Alcohol use: No    Alcohol/week: 0.0 standard drinks  . Drug use: No    Comment: has used crack cocaine in past      MEDICATIONS   Current Medication:  Current Facility-Administered  Medications:  .  acetaminophen (TYLENOL) tablet 650 mg, 650 mg, Oral, Q4H PRN, Mansy, Jan A, MD .  albuterol (PROVENTIL) (2.5 MG/3ML) 0.083% nebulizer solution 2.5 mg, 2.5 mg, Inhalation, Q6H PRN, Mansy, Jan A, MD .  aspirin EC tablet 81 mg, 81 mg, Oral, Daily, Mansy, Jan A, MD .  atorvastatin (LIPITOR) tablet 80 mg, 80 mg, Oral, q1800, Mansy, Jan A, MD .  carvedilol (COREG) tablet 6.25 mg, 6.25 mg, Oral, BID, Mansy, Jan A, MD, 6.25 mg at 08/10/18 2222 .  Chlorhexidine Gluconate Cloth 2 % PADS 6 each, 6 each, Topical, Q0600, Murlean Iba, MD, 6 each at 08/10/18 0650 .  Chlorhexidine Gluconate Cloth 2 % PADS 6 each, 6 each, Topical, Q0600, Ottie Glazier, MD .  docusate sodium (COLACE) capsule 100 mg, 100 mg, Oral, Daily PRN, Mansy, Jan A, MD .  erythromycin ophthalmic ointment 1 application, 1 application, Right Eye, QHS, Mansy, Jan A, MD .  fluticasone (FLONASE) 50 MCG/ACT nasal spray 1 spray, 1 spray, Each Nare, Daily, Mansy, Jan A, MD .  guaiFENesin (ROBITUSSIN) 100 MG/5ML solution 200 mg, 200 mg, Oral, TID  PRN, Mansy, Jan A, MD .  heparin ADULT infusion 100 units/mL (25000 units/231mL sodium chloride 0.45%), 850 Units/hr, Intravenous, Continuous, Vaughan Basta, MD, Last Rate: 8.5 mL/hr at 08/11/18 0703, 850 Units/hr at 08/11/18 0703 .  ipratropium-albuterol (DUONEB) 0.5-2.5 (3) MG/3ML nebulizer solution 3 mL, 3 mL, Inhalation, Q6H PRN, Mansy, Jan A, MD .  isosorbide mononitrate (IMDUR) 24 hr tablet 30 mg, 30 mg, Oral, Daily, Mansy, Jan A, MD, 30 mg at 08/10/18 1014 .  loratadine (CLARITIN) tablet 10 mg, 10 mg, Oral, Daily, Mansy, Jan A, MD, 10 mg at 08/10/18 1014 .  MEDLINE mouth rinse, 15 mL, Mouth Rinse, BID, Nema Oatley, MD .  midodrine (PROAMATINE) tablet 10 mg, 10 mg, Oral, Q8H, Mansy, Jan A, MD, 10 mg at 08/11/18 0525 .  morphine 2 MG/ML injection 2 mg, 2 mg, Intravenous, Q4H PRN, Darel Hong D, NP .  multivitamin (RENA-VIT) tablet 1 tablet, 1 tablet, Oral, QHS,  Charlett Nose, RPH, 1 tablet at 08/10/18 2222 .  mupirocin ointment (BACTROBAN) 2 % 1 application, 1 application, Nasal, BID, Ottie Glazier, MD, 1 application at 29/92/42 2223 .  nitroGLYCERIN (NITROSTAT) 0.4 MG SL tablet, , , ,  .  nitroGLYCERIN (NITROSTAT) SL tablet 0.4 mg, 0.4 mg, Sublingual, Q5 min PRN, Darel Hong D, NP, 0.4 mg at 08/11/18 0604 .  norepinephrine (LEVOPHED) 4 mg in dextrose 5 % 250 mL (0.016 mg/mL) infusion, 0-40 mcg/min, Intravenous, Titrated, Mansy, Jan A, MD .  ondansetron [facility]) injection 4 mg, 4 mg, Intravenous, Q6H PRN, Mansy, Jan A, MD .  pantoprazole (PROTONIX) 80 mg in sodium chloride 0.9 % 250 mL (0.32 mg/mL) infusion, 8 mg/hr, Intravenous, Continuous, Carrie Mew, MD, Last Rate: 25 mL/hr at 08/11/18 0222, 8 mg/hr at 08/11/18 0222 .  polyethylene glycol (MIRALAX / GLYCOLAX) packet 17 g, 17 g, Oral, Daily PRN, Mansy, Jan A, MD .  pregabalin (LYRICA) capsule 75 mg, 75 mg, Oral, Daily, Mansy, Jan A, MD, 75 mg at 08/10/18 1014 .  tiotropium (SPIRIVA) inhalation capsule ([facility] use ONLY) 18 mcg, 18 mcg, Inhalation, Daily, Mansy, Jan A, MD, 18 mcg at 08/10/18 1014 .  traMADol (ULTRAM) tablet 75 mg, 75 mg, Oral, Q8H PRN, Mansy, Jan A, MD, 75 mg at 08/11/18 0530 .  vitamin C (ASCORBIC ACID) tablet 500 mg, 500 mg, Oral, BID, Charlett Nose, RPH, 500 mg at 08/10/18 2222 .  zolpidem (AMBIEN) tablet 5 mg, 5 mg, Oral, QHS PRN,MR X 1, Mansy, Jan A, MD    ALLERGIES   Dust mite extract    REVIEW OF SYSTEMS     10 system ROS conducted and is negative except as per subjective findings  PHYSICAL EXAMINATION   Vitals:   08/11/18 0600 08/11/18 0621  BP: (!) 86/57 107/74  Pulse: 91 85  Resp: (!) 23 14  Temp:    SpO2: 96% 91%    GENERAL: No apparent distress HEAD: Normocephalic, atraumatic.  EYES: Pupils equal, round, reactive to light.  No scleral icterus.  MOUTH: Moist mucosal membrane. NECK: Supple. No thyromegaly. No nodules. No JVD.   PULMONARY: Decreased breath sounds bilaterally CARDIOVASCULAR: S1 and S2. Regular rate and rhythm. No murmurs, rubs, or gallops.  GASTROINTESTINAL: Soft, nontender, non-distended. No masses. Positive bowel sounds. No hepatosplenomegaly.  MUSCULOSKELETAL: No swelling, clubbing, or edema.  NEUROLOGIC: Mild distress due to acute illness SKIN:intact,warm,dry   LABS AND IMAGING     -I personally reviewed most recent blood work, imaging and microbiology - significant findings today are CKD stage IV  LAB RESULTS: Recent Labs  Lab 08/04/18 1305 08/10/18 0243 08/11/18 0453  NA 141 138 140  K 3.7 4.1 4.5  CL 100 103 103  CO2 29 21* 27  BUN 56* 67* 39*  CREATININE 8.38* 10.25* 7.30*  GLUCOSE 98 131* 88   Recent Labs  Lab 08/10/18 0243 08/11/18 0453  HGB 7.6* 9.3*  HCT 24.2* 29.6*  WBC 8.9 10.7*  PLT 209 205     IMAGING RESULTS: No results found.    ASSESSMENT AND PLAN   -Multidisciplinary rounds held today   Acute blood loss anemia    - Hematochezia - likely related to oral anticoagulant agent    - H/H trending -PRBC transfusion with goal Hb>8 due to active CAD    - GI evaluation -poss EGD/Cscope post cardio clearance  Acute Hypoxic Respiratory Failure -continue supportive measures - [place] O2 goal O2 saturation 90% -continue Bronchodilator Therapy -likely due to fluid overload   CARDIAC FAILURE- -NSTEMI - Cardiology on case - appreciate input-status post left heart cath  -follow up cardiac enzymes as indicated ICU monitoring  Renal Failure-CKD4 -Nephrology on case - appreciate input - s/p HD    ID -continue IV abx as prescibed-nasal MRSA + -follow up cultures  GI/Nutrition GI PROPHYLAXIS as indicated DIET-->TF's as tolerated Constipation protocol as indicated  ENDO - ICU hypoglycemic\Hyperglycemia protocol -check FSBS per protocol   ELECTROLYTES -follow labs as needed -replace as needed -pharmacy consultation   DVT/GI PRX ordered  -SCDs  TRANSFUSIONS AS NEEDED MONITOR FSBS ASSESS the need for LABS as needed  Critical care provider statement:    Critical care time (minutes):  35   Critical care time was exclusive of:  Separately billable procedures and treating other patients   Critical care was necessary to treat or prevent imminent or life-threatening deterioration of the following conditions:   Acute coronary syndrome, severe chest pain, CKD stage IV   Critical care was time spent personally by me on the following activities:  Development of treatment plan with patient or surrogate, discussions with consultants, evaluation of patient's response to treatment, examination of patient, obtaining history from patient or surrogate, ordering and performing treatments and interventions, ordering and review of laboratory studies and re-evaluation of patient's condition.  I assumed direction of critical care for this patient from another provider in my specialty: no    This document was prepared using Dragon voice recognition software and may include unintentional dictation errors.    Ottie Glazier, M.D.  Division of [place]

## 2018-08-11 NOTE — TOC Progression Note (Signed)
Transition of Care [facility]) - Progression Note    Patient Details  Name: [name] MRN: [id number] Date of Birth: 04/21/1956  Transition of Care [facility]) CM/SW Contact  Shelbie Hutching, RN Phone Number: 08/11/2018, 2:31 PM  Clinical Narrative:    RNCM notified that patient is open with [facility] for home health nursing by [facility].  Informed Cassie that we will let [facility] know when patient is discharged.  Patient is currently in the Cath lab after having chest pain and elevated troponin last night.    Expected Discharge Plan: Assisted Living(with home health care)    Expected Discharge Plan and Services Expected Discharge Plan: Assisted Living(with home health care)     Living arrangements for the past 2 months: Assisted Living Facility                           Social Determinants of Health (SDOH) Interventions    Readmission Risk Interventions 30 Day Unplanned Readmission Risk Score     ED to Hosp-Admission (Current) from 08/10/2018 in [facility]  30 Day Unplanned Readmission Risk Score (%)  30 Filed at 08/11/2018 1200     This score is the patient's risk of an unplanned readmission within 30 days of being discharged (0 -100%). The score is based on dignosis, age, lab data, medications, orders, and past utilization.   Low:  0-14.9   Medium: 15-21.9   High: 22-29.9   Extreme: 30 and above       Readmission Risk Prevention Plan 04/24/2018  Transportation Screening Complete  Medication Review [employer]) Complete  PCP or Specialist appointment within 3-5 days of discharge Complete  HRI or Home Care Consult Complete  SW Recovery Care/Counseling Consult Complete  Palliative Care Screening Not Complete  Medication Reconcilation (Pharmacy) Complete  Skilled [place] Not Complete  Some recent data might be hidden

## 2018-08-11 NOTE — Progress Notes (Signed)
[facility] Encounter Note  Patient: [name] / Admit Date: 08/10/2018 / Date of Encounter: 08/11/2018, 8:21 AM   Subjective: Patient had chest pain last night with apparent ST depression by telemetry suggesting continued evidence of ischemia.  Patient has had no further chest pain this morning with appropriate medication management listed below.  Elevated troponin is 7.3 consistent with non-ST elevation myocardial infarction.  Echocardiogram showing severe LV systolic dysfunction with possible mitral valve chordal abnormality with mitral insufficiency and ejection fraction of 20%  Review of Systems: Positive for: Redness of breath chest pain Negative for: Vision change, hearing change, syncope, dizziness, nausea, vomiting,diarrhea, bloody stool, stomach pain, cough, congestion, diaphoresis, urinary frequency, urinary pain,skin lesions, skin rashes Others previously listed  Objective: Telemetry: Normal sinus rhythm Physical Exam: Blood pressure 107/74, pulse 85, temperature 98.2 F (36.8 C), temperature source Oral, resp. rate 14, height 5\' 8"  (1.727 m), weight 93.1 kg, SpO2 91 %. Body mass index is 31.21 kg/m. General: Well developed, well nourished, in no acute distress. Head: Normocephalic, atraumatic, sclera non-icteric, no xanthomas, nares are without discharge. Neck: No apparent masses Lungs: Normal respirations with no wheezes, no rhonchi, few rales , no crackles   Heart: Regular rate and rhythm, normal S1 S2, 2+ apical murmur, no rub, no gallop, PMI is normal size and placement, carotid upstroke normal without bruit, jugular venous pressure normal Abdomen: Soft, non-tender, non-distended with normoactive bowel sounds. No hepatosplenomegaly. Abdominal aorta is normal size without bruit Extremities: 1+ edema, no clubbing, no cyanosis, no ulcers,  Peripheral: 2+ radial, 2+ femoral, 2+ dorsal pedal pulses Neuro: Alert and oriented. Moves all extremities  spontaneously. Psych:  Responds to questions appropriately with a normal affect.   Intake/Output Summary (Last 24 hours) at 08/11/2018 0821 Last data filed at 08/11/2018 0600 Gross per 24 hour  Intake 2203.19 ml  Output 2000 ml  Net 203.19 ml    Inpatient Medications:  . aspirin EC  81 mg Oral Daily  . atorvastatin  80 mg Oral q1800  . carvedilol  6.25 mg Oral BID  . Chlorhexidine Gluconate Cloth  6 each Topical Q0600  . Chlorhexidine Gluconate Cloth  6 each Topical Q0600  . erythromycin  1 application Right Eye QHS  . fluticasone  1 spray Each Nare Daily  . isosorbide mononitrate  30 mg Oral Daily  . loratadine  10 mg Oral Daily  . mouth rinse  15 mL Mouth Rinse BID  . midodrine  10 mg Oral Q8H  . multivitamin  1 tablet Oral QHS  . mupirocin ointment  1 application Nasal BID  . nitroGLYCERIN      . pregabalin  75 mg Oral Daily  . tiotropium  18 mcg Inhalation Daily  . vitamin C  500 mg Oral BID   Infusions:  . heparin 850 Units/hr (08/11/18 0703)  . norepinephrine (LEVOPHED) Adult infusion    . pantoprozole (PROTONIX) infusion 8 mg/hr (08/11/18 0222)    Labs: Recent Labs    08/10/18 0243 08/11/18 0453  NA 138 140  K 4.1 4.5  CL 103 103  CO2 21* 27  GLUCOSE 131* 88  BUN 67* 39*  CREATININE 10.25* 7.30*  CALCIUM 9.1 8.9  PHOS 5.2*  --    Recent Labs    08/10/18 0243  AST 35  ALT 29  ALKPHOS 82  BILITOT 0.3  PROT 6.6  ALBUMIN 3.0*   Recent Labs    08/10/18 0243 08/11/18 0453  WBC 8.9 10.7*  NEUTROABS 5.5  --  HGB 7.6* 9.3*  HCT 24.2* 29.6*  MCV 100.4* 97.4  PLT 209 205   Recent Labs    08/10/18 0243 08/11/18 0624  TROPONINI 1.89* 7.34*   Invalid input(s): POCBNP No results for input(s): HGBA1C in the last 72 hours.   Weights: Filed Weights   08/10/18 1519 08/10/18 1900 08/11/18 0500  Weight: 95.1 kg 92.8 kg 93.1 kg     Radiology/Studies:  Dg Chest Portable 1 View  Result Date: 08/10/2018 CLINICAL DATA:  Chest pain EXAM: PORTABLE  CHEST 1 VIEW COMPARISON:  04/24/2018 FINDINGS: Cardiomegaly. Bilateral airspace disease with small bilateral effusions. Findings likely reflect edema/CHF. No acute bony abnormality. IMPRESSION: Cardiomegaly with bilateral effusions and diffuse bilateral airspace disease, likely edema/CHF. Electronically Signed   By: Rolm Baptise M.D.   On: 08/10/2018 03:09     Assessment and Recommendation  63 y.o. male with known chronic kidney disease stage V hypertension hyperlipidemia coronary artery disease now with acute non-ST elevation myocardial infarction and acute on chronic systolic dysfunction heart failure slowly improved status post GI bleed of unknown origin now with more stable hemoglobin needing further treatment options 1.  Continue medication management for anginal symptoms 2.  Continue heparin for further risk reduction myocardial infarction 3.  Proceed to cardiac catheterization to assess coronary anatomy and further treatment thereof is necessary.  Patient understands risk and benefits of cardiac catheterization.  This includes a possibility death stroke heart attack infection bleeding or blood clot.  He is at low risk for conscious sedation 4.  Further treatment options after about  Signed, Serafina Royals M.D. FACC

## 2018-08-11 NOTE — Progress Notes (Addendum)
[facility] Encounter Note  Patient: [name] / Admit Date: 08/10/2018 / Date of Encounter: 08/11/2018, 1:57 PM   Subjective: Patient had chest pain last night with apparent ST depression by telemetry suggesting continued evidence of ischemia.  Patient has had no further chest pain this morning with appropriate medication management listed below.  Elevated troponin is 7.3 consistent with non-ST elevation myocardial infarction.  Echocardiogram showing severe LV systolic dysfunction with possible mitral valve chordal abnormality with mitral insufficiency and ejection fraction of 20% Cardiac catheterization showing significantly elevated end-diastolic pressure Occluded proximal right coronary artery and occluded proximal left circumflex artery with significant 85% stenosis of left anterior descending artery and left main coronary artery  Review of Systems: Positive for: Shortness of breath chest pain Negative for: Vision change, hearing change, syncope, dizziness, nausea, vomiting,diarrhea, bloody stool, stomach pain, cough, congestion, diaphoresis, urinary frequency, urinary pain,skin lesions, skin rashes Others previously listed  Objective: Telemetry: Normal sinus rhythm with left bundle branch block Physical Exam: Blood pressure 106/83, pulse 88, temperature 99.5 F (37.5 C), temperature source Oral, resp. rate 13, height 5\' 8"  (1.727 m), weight 93.1 kg, SpO2 93 %. Body mass index is 31.21 kg/m. General: Well developed, well nourished, in no acute distress. Head: Normocephalic, atraumatic, sclera non-icteric, no xanthomas, nares are without discharge. Neck: No apparent masses Lungs: Normal respirations with no wheezes, no rhonchi, few rales , basilar crackles   Heart: Regular rate and rhythm, normal S1 S2, 2+ apical murmur, no rub, no gallop, PMI is normal size and placement, carotid upstroke normal without bruit, jugular venous pressure normal Abdomen: Soft,  non-tender, non-distended with normoactive bowel sounds. No hepatosplenomegaly. Abdominal aorta is normal size without bruit Extremities: 1+ edema, no clubbing, no cyanosis, no ulcers,  Peripheral: 2+ radial, 2+ femoral, 2+ dorsal pedal pulses Neuro: Alert and oriented. Moves all extremities spontaneously. Psych:  Responds to questions appropriately with a normal affect.   Intake/Output Summary (Last 24 hours) at 08/11/2018 1357 Last data filed at 08/11/2018 0600 Gross per 24 hour  Intake 1563.19 ml  Output 2000 ml  Net -436.81 ml    Inpatient Medications:  . [START ON 08/12/2018] aspirin  81 mg Oral Pre-Cath  . [MAR Hold] aspirin EC  81 mg Oral Daily  . [MAR Hold] atorvastatin  80 mg Oral q1800  . [MAR Hold] carvedilol  6.25 mg Oral BID  . [MAR Hold] Chlorhexidine Gluconate Cloth  6 each Topical Q0600  . [MAR Hold] Chlorhexidine Gluconate Cloth  6 each Topical Q0600  . [MAR Hold] erythromycin  1 application Right Eye QHS  . [MAR Hold] fluticasone  1 spray Each Nare Daily  . [MAR Hold] isosorbide mononitrate  30 mg Oral Daily  . [MAR Hold] loratadine  10 mg Oral Daily  . [MAR Hold] mouth rinse  15 mL Mouth Rinse BID  . [MAR Hold] midodrine  10 mg Oral Q8H  . [MAR Hold] multivitamin  1 tablet Oral QHS  . [MAR Hold] mupirocin ointment  1 application Nasal BID  . nitroGLYCERIN      . [MAR Hold] pregabalin  75 mg Oral Daily  . sodium chloride flush  3 mL Intravenous Q12H  . [MAR Hold] tiotropium  18 mcg Inhalation Daily  . [MAR Hold] vitamin C  500 mg Oral BID   Infusions:  . sodium chloride    . [START ON 08/12/2018] sodium chloride     Followed by  . [START ON 08/12/2018] sodium chloride    .  heparin Stopped (08/11/18 1310)  . [MAR Hold] norepinephrine (LEVOPHED) Adult infusion    . pantoprozole (PROTONIX) infusion 8 mg/hr (08/11/18 0222)    Labs: Recent Labs    08/10/18 0243 08/11/18 0453  NA 138 140  K 4.1 4.5  CL 103 103  CO2 21* 27  GLUCOSE 131* 88  BUN 67* 39*   CREATININE 10.25* 7.30*  CALCIUM 9.1 8.9  PHOS 5.2*  --    Recent Labs    08/10/18 0243  AST 35  ALT 29  ALKPHOS 82  BILITOT 0.3  PROT 6.6  ALBUMIN 3.0*   Recent Labs    08/10/18 0243 08/11/18 0453  WBC 8.9 10.7*  NEUTROABS 5.5  --   HGB 7.6* 9.3*  HCT 24.2* 29.6*  MCV 100.4* 97.4  PLT 209 205   Recent Labs    08/10/18 0243 08/11/18 0624  TROPONINI 1.89* 7.34*   Invalid input(s): POCBNP No results for input(s): HGBA1C in the last 72 hours.   Weights: Filed Weights   08/10/18 1519 08/10/18 1900 08/11/18 0500  Weight: 95.1 kg 92.8 kg 93.1 kg     Radiology/Studies:  Dg Chest Portable 1 View  Result Date: 08/10/2018 CLINICAL DATA:  Chest pain EXAM: PORTABLE CHEST 1 VIEW COMPARISON:  04/24/2018 FINDINGS: Cardiomegaly. Bilateral airspace disease with small bilateral effusions. Findings likely reflect edema/CHF. No acute bony abnormality. IMPRESSION: Cardiomegaly with bilateral effusions and diffuse bilateral airspace disease, likely edema/CHF. Electronically Signed   By: Rolm Baptise M.D.   On: 08/10/2018 03:09     Assessment and Recommendation  63 y.o. male with known chronic kidney disease stage V hypertension hyperlipidemia coronary artery disease now with acute non-ST elevation myocardial infarction and acute on chronic systolic dysfunction heart failure slightly improved status post GI bleed of unknown origin suspecting upper gastric due to melanic stool now with more stable hemoglobin at 9.3 needing further treatment options Cardiac catheterization with occluded proximal right coronary artery left circumflex artery and significant stenosis of distal left main and LAD at 85% After discussion with interventional cardiology there is concerns that the patient has very high risk features including severe LV dysfunction and severe coronary artery disease.  It would be most appropriate to assess for extent of viability in the anterior myocardial distribution for prove  that intervention would be an appropriate venture. 1.  Continue medication management for anginal symptoms including nitrate and beta-blocker 2.  Continue heparin for further risk reduction myocardial infarction and significant stenosis of LAD and distal left main watching closely for further GI bleed 3.  Continuation of beta-blocker for cardiomyopathy and acute on chronic systolic dysfunction heart failure  5.  High intensity cholesterol therapy  6.   Further consideration of viability study to assess for myocardium at risk which would improve with intervention. signed, Serafina Royals M.D. FACC

## 2018-08-11 NOTE — Progress Notes (Signed)
[facility] for heparin Indication: chest pain/ACS  Allergies  Allergen Reactions  . Dust Mite Extract Other (See Comments)    Reaction: unknown    Patient Measurements: Height: 5\' 8"  (172.7 cm) Weight: 205 lb 4 oz (93.1 kg) IBW/kg (Calculated) : 68.4 Heparin Dosing Weight: 90.4 kg  Vital Signs: Temp: 98.2 F (36.8 C) (03/17 0400) Temp Source: Oral (03/17 0400) BP: 107/74 (03/17 0621) Pulse Rate: 85 (03/17 0621)  Labs: Recent Labs    08/10/18 0243  08/10/18 0415 08/10/18 1340 08/10/18 1802 08/11/18 0453  HGB 7.6*  --   --   --   --   --   HCT 24.2*  --   --   --   --   --   PLT 209  --   --   --   --   --   APTT  --    < > 36 102* 98* 114*  LABPROT  --   --  15.9*  --   --  14.9  INR  --   --  1.3*  --   --  1.2  HEPARINUNFRC  --   --  >3.60*  --   --  3.16*  CREATININE 10.25*  --   --   --   --  7.30*  TROPONINI 1.89*  --   --   --   --   --    < > = values in this interval not displayed.    Estimated Creatinine Clearance: 11.3 mL/min (A) (by C-G formula based on SCr of 7.3 mg/dL (H)).   Medical History: Past Medical History:  Diagnosis Date  . 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 Amputee, below knee, left ([place])   . Anemia   . Asthma   . Cardiomyopathy ([place])   . CHF (congestive heart failure) ([place])   . Chronic systolic heart failure ([place])   . Complication of anesthesia    hypotension  . COPD (chronic obstructive pulmonary disease) ([place])   . Coronary artery disease   . Dialysis patient ([place])    Mon, Wed, Fri  . End stage renal disease ([place])   . GERD (gastroesophageal reflux disease)   . Headache   . History of kidney stones   . History of pulmonary embolism   . HLD (hyperlipidemia)   . HTN (hypertension)   . Hyperparathyroidism   . Myocardial infarction ([place])   . Peripheral vascular disease ([place])   . Shortness of breath dyspnea   . Sleep apnea    NO C-PAP, Patient stated in process of   "getting one"   . Tobacco dependence     Medications:  Scheduled:  . aspirin EC  81 mg Oral Daily  . atorvastatin  80 mg Oral q1800  . carvedilol  6.25 mg Oral BID  . Chlorhexidine Gluconate Cloth  6 each Topical Q0600  . Chlorhexidine Gluconate Cloth  6 each Topical Q0600  . erythromycin  1 application Right Eye QHS  . fluticasone  1 spray Each Nare Daily  . isosorbide mononitrate  30 mg Oral Daily  . loratadine  10 mg Oral Daily  . mouth rinse  15 mL Mouth Rinse BID  . midodrine  10 mg Oral Q8H  . multivitamin  1 tablet Oral QHS  . mupirocin ointment  1 application Nasal BID  . nitroGLYCERIN      . pregabalin  75 mg Oral Daily  . tiotropium  18 mcg Inhalation Daily  . vitamin C  500 mg Oral BID    Assessment: Patient admitted for CP from nursing facility, trops 1.89. Patient takes eliquis 5 mg bid PTA (unclear why) and unsure when last dose was.  Patient is being started on heparin drip for UA/ACS Baseline labs show HL >3.6, hgb 7.6, PT/INR 15.9/1.3  Goal of Therapy:  APTT 66 - 102 seconds Heparin level 0.3-0.7 units/ml Monitor platelets by anticoagulation protocol: Yes   Plan:  03/17 @ 0500 aPTT 114 seconds, HL 3.16 both supratherapeutic and uncorrelative. Will decrease rate to 850 units/hr and will recheck aPTT @ 1300 and HL w/ am labs. Will dose off of HL once both are correlating. H/h trending down but stable, will continue to monitor.  Tobie Lords, PharmD, BCPS Clinical Pharmacist 08/11/2018

## 2018-08-11 NOTE — Progress Notes (Signed)
[facility]  ROUNDING NOTE   Subjective:  Patient underwent hemodialysis yesterday. Remains in the critical care unit. Does not complain of chest pain at the moment.   Objective:  Vital signs in last 24 hours:  Temp:  [97.4 F (36.3 C)-99 F (37.2 C)] 98.2 F (36.8 C) (03/17 0400) Pulse Rate:  [69-91] 85 (03/17 0621) Resp:  [9-25] 14 (03/17 0621) BP: (81-115)/(57-84) 107/74 (03/17 0621) SpO2:  [91 %-100 %] 91 % (03/17 0621) Weight:  [92.8 kg-95.1 kg] 93.1 kg (03/17 0500)  Weight change: -0.155 kg Filed Weights   08/10/18 1519 08/10/18 1900 08/11/18 0500  Weight: 95.1 kg 92.8 kg 93.1 kg    Intake/Output: I/O last 3 completed shifts: In: 2238.2 [I.V.:873.2; Blood:1340; IV Piggyback:25] Out: 2000 [Other:2000]   Intake/Output this shift:  No intake/output data recorded.  Physical Exam: General: No acute distress  Head: Normocephalic, atraumatic. Moist oral mucosal membranes  Eyes: Anicteric  Neck: Supple, trachea midline  Lungs:  Clear to auscultation, normal effort  Heart: S1S2 no rubs  Abdomen:  Soft, nontender, bowel sounds present  Extremities: Trace peripheral edema, L BKA  Neurologic: Awake, alert, following commands  Skin: No lesions  Access: LUE AVF    Basic Metabolic Panel: Recent Labs  Lab 08/04/18 1305 08/10/18 0243 08/11/18 0453  NA 141 138 140  K 3.7 4.1 4.5  CL 100 103 103  CO2 29 21* 27  GLUCOSE 98 131* 88  BUN 56* 67* 39*  CREATININE 8.38* 10.25* 7.30*  CALCIUM 9.6 9.1 8.9  PHOS  --  5.2*  --     Liver Function Tests: Recent Labs  Lab 08/10/18 0243  AST 35  ALT 29  ALKPHOS 82  BILITOT 0.3  PROT 6.6  ALBUMIN 3.0*   Recent Labs  Lab 08/10/18 0243  LIPASE 86*   No results for input(s): AMMONIA in the last 168 hours.  CBC: Recent Labs  Lab 08/10/18 0243 08/11/18 0453  WBC 8.9 10.7*  NEUTROABS 5.5  --   HGB 7.6* 9.3*  HCT 24.2* 29.6*  MCV 100.4* 97.4  PLT 209 205    Cardiac Enzymes: Recent Labs   Lab 08/04/18 1305 08/10/18 0243 08/11/18 0624  CKTOTAL 145  --   --   CKMB 5.2*  --   --   TROPONINI 0.22* 1.89* 7.34*    BNP: Invalid input(s): POCBNP  CBG: Recent Labs  Lab 08/10/18 0644  GLUCAP 104*    Microbiology: Results for orders placed or performed during the hospital encounter of 08/10/18  MRSA PCR Screening     Status: Abnormal   Collection Time: 08/10/18  6:54 AM  Result Value Ref Range Status   MRSA by PCR POSITIVE (A) NEGATIVE Final    Comment:        The GeneXpert MRSA Assay (FDA approved for NASAL specimens only), is one component of a comprehensive MRSA colonization surveillance program. It is not intended to diagnose MRSA infection nor to guide or monitor treatment for MRSA infections. RESULT CALLED TO, READ BACK BY AND VERIFIED WITH:  [facility] [place] AT 9163 08/10/18 KM/SDR Performed at [facility], [address] [place], [place] [address]     Coagulation Studies: Recent Labs    08/10/18 0415 08/11/18 0453  LABPROT 15.9* 14.9  INR 1.3* 1.2    Urinalysis: No results for input(s): COLORURINE, LABSPEC, PHURINE, GLUCOSEU, HGBUR, BILIRUBINUR, KETONESUR, PROTEINUR, UROBILINOGEN, NITRITE, LEUKOCYTESUR in the last 72 hours.  Invalid input(s): APPERANCEUR    Imaging: Dg Chest  Portable 1 View  Result Date: 08/10/2018 CLINICAL DATA:  Chest pain EXAM: PORTABLE CHEST 1 VIEW COMPARISON:  04/24/2018 FINDINGS: Cardiomegaly. Bilateral airspace disease with small bilateral effusions. Findings likely reflect edema/CHF. No acute bony abnormality. IMPRESSION: Cardiomegaly with bilateral effusions and diffuse bilateral airspace disease, likely edema/CHF. Electronically Signed   By: Rolm Baptise M.D.   On: 08/10/2018 03:09     Medications:   . heparin 850 Units/hr (08/11/18 0703)  . norepinephrine (LEVOPHED) Adult infusion    . pantoprozole (PROTONIX) infusion 8 mg/hr (08/11/18 0222)   . aspirin EC  81 mg Oral Daily  . atorvastatin  80  mg Oral q1800  . carvedilol  6.25 mg Oral BID  . Chlorhexidine Gluconate Cloth  6 each Topical Q0600  . Chlorhexidine Gluconate Cloth  6 each Topical Q0600  . erythromycin  1 application Right Eye QHS  . fluticasone  1 spray Each Nare Daily  . isosorbide mononitrate  30 mg Oral Daily  . loratadine  10 mg Oral Daily  . mouth rinse  15 mL Mouth Rinse BID  . midodrine  10 mg Oral Q8H  . multivitamin  1 tablet Oral QHS  . mupirocin ointment  1 application Nasal BID  . nitroGLYCERIN      . pregabalin  75 mg Oral Daily  . tiotropium  18 mcg Inhalation Daily  . vitamin C  500 mg Oral BID   acetaminophen, albuterol, docusate sodium, guaiFENesin, ipratropium-albuterol, morphine injection, nitroGLYCERIN, ondansetron (ZOFRAN) IV, polyethylene glycol, traMADol, zolpidem  Assessment/ Plan:  63 y.o. male with ESRD on hemodialysis, hypertension, hyperlipidemia, anemia, COPD, peripheral vascular disease, left BKA, right toe amputations  MWF [facility]   1.  End-stage renal disease 2.  Chest pain. 3.  Anemia of chronic kidney disease 4.  Chronic hypotension 5.  Secondary hyperparathyroidism  Plan: Patient completed hemodialysis yesterday.  No acute indication for dialysis today.  We will schedule the patient for dialysis treatment tomorrow.  Hemoglobin 9.3.  Consider starting Epogen with dialysis treatment tomorrow.  Recheck serum phosphorus tomorrow as well.  Maintain the patient on midodrine for chronic hypotension.  LOS: 1 Nyelah Emmerich 3/17/20209:25 AM

## 2018-08-11 NOTE — Progress Notes (Signed)
[place] for heparin Indication: chest pain/ACS  Allergies  Allergen Reactions  . Dust Mite Extract Other (See Comments)    Reaction: unknown    Patient Measurements: Height: 5\' 8"  (172.7 cm) Weight: 205 lb 4 oz (93.1 kg) IBW/kg (Calculated) : 68.4 Heparin Dosing Weight: 90.4 kg  Vital Signs: Temp: 99.5 F (37.5 C) (03/17 1253) Temp Source: Oral (03/17 1253) BP: 120/89 (03/17 1445) Pulse Rate: 78 (03/17 1450)  Labs: Recent Labs    08/10/18 0243 08/10/18 0415  08/10/18 1802 08/11/18 0453 08/11/18 0624 08/11/18 1534  HGB 7.6*  --   --   --  9.3*  --   --   HCT 24.2*  --   --   --  29.6*  --   --   PLT 209  --   --   --  205  --   --   APTT  --  36   < > 98* 114*  --  40*  LABPROT  --  15.9*  --   --  14.9  --   --   INR  --  1.3*  --   --  1.2  --   --   HEPARINUNFRC  --  >3.60*  --   --  3.16*  --   --   CREATININE 10.25*  --   --   --  7.30*  --   --   TROPONINI 1.89*  --   --   --   --  7.34*  --    < > = values in this interval not displayed.    Estimated Creatinine Clearance: 11.3 mL/min (A) (by C-G formula based on SCr of 7.3 mg/dL (H)).   Medical History: Past Medical History:  Diagnosis Date  . 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 Amputee, below knee, left ([place])   . Anemia   . Asthma   . Cardiomyopathy ([place])   . CHF (congestive heart failure) ([place])   . Chronic systolic heart failure ([place])   . Complication of anesthesia    hypotension  . COPD (chronic obstructive pulmonary disease) ([place])   . Coronary artery disease   . Dialysis patient ([place])    Mon, Wed, Fri  . End stage renal disease ([place])   . GERD (gastroesophageal reflux disease)   . Headache   . History of kidney stones   . History of pulmonary embolism   . HLD (hyperlipidemia)   . HTN (hypertension)   . Hyperparathyroidism   . Myocardial infarction ([place])   . Peripheral vascular disease ([place])   . Shortness of breath dyspnea   .  Sleep apnea    NO C-PAP, Patient stated in process of  "getting one"   . Tobacco dependence     Medications:  Scheduled:  . aspirin EC  81 mg Oral Daily  . atorvastatin  80 mg Oral q1800  . carvedilol  6.25 mg Oral BID  . Chlorhexidine Gluconate Cloth  6 each Topical Q0600  . Chlorhexidine Gluconate Cloth  6 each Topical Q0600  . erythromycin  1 application Right Eye QHS  . fluticasone  1 spray Each Nare Daily  . isosorbide mononitrate  30 mg Oral Daily  . loratadine  10 mg Oral Daily  . mouth rinse  15 mL Mouth Rinse BID  . midodrine  10 mg Oral Q8H  . multivitamin  1 tablet Oral QHS  . mupirocin ointment  1 application  Nasal BID  . nitroGLYCERIN      . pregabalin  75 mg Oral Daily  . tiotropium  18 mcg Inhalation Daily  . vitamin C  500 mg Oral BID    Assessment: Patient admitted for CP from nursing facility, trops 1.89. Patient takes eliquis 5 mg bid PTA (unclear why) and unsure when last dose was.  Patient is being started on heparin drip for UA/ACS Baseline labs show HL >3.6, hgb 7.6, PT/INR 15.9/1.3  Goal of Therapy:  APTT 66 - 102 seconds Heparin level 0.3-0.7 units/ml Monitor platelets by anticoagulation protocol: Yes   Plan:  Patient remains on pantoprazole infusion. Patient had cardiac catheterization today showing occluded proximal right coronary artery left circumflex artery and significant stenosis of distal left main and LAD at 85%. Patient's heparin resumed at 1630 with no bolus. Will obtain aPTT at 2030. Will order anti-Xa level with am labs. Will manage heparin via aPTTs until anti-Xa levels correlate with aPTTs.   Pharmacy will continue to monitor and adjust per consult.    MLS 08/11/2018

## 2018-08-11 NOTE — Progress Notes (Addendum)
[place] for heparin Indication: chest pain/ACS  Allergies  Allergen Reactions  . Dust Mite Extract Other (See Comments)    Reaction: unknown    Patient Measurements: Height: 5\' 8"  (172.7 cm) Weight: 205 lb 4 oz (93.1 kg) IBW/kg (Calculated) : 68.4 Heparin Dosing Weight: 90.4 kg  Vital Signs: Temp: 98.8 F (37.1 C) (03/17 1900) Temp Source: Oral (03/17 1900) BP: 92/65 (03/17 2000) Pulse Rate: 71 (03/17 2000)  Labs: Recent Labs    08/10/18 0243 08/10/18 0415  08/11/18 0453 08/11/18 0624 08/11/18 1534 08/11/18 2019  HGB 7.6*  --   --  9.3*  --   --   --   HCT 24.2*  --   --  29.6*  --   --   --   PLT 209  --   --  205  --   --   --   APTT  --  36   < > 114*  --  40* 60*  LABPROT  --  15.9*  --  14.9  --   --   --   INR  --  1.3*  --  1.2  --   --   --   HEPARINUNFRC  --  >3.60*  --  3.16*  --   --   --   CREATININE 10.25*  --   --  7.30*  --   --   --   TROPONINI 1.89*  --   --   --  7.34*  --   --    < > = values in this interval not displayed.    Estimated Creatinine Clearance: 11.3 mL/min (A) (by C-G formula based on SCr of 7.3 mg/dL (H)).   Medical History: Past Medical History:  Diagnosis Date  . 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 Amputee, below knee, left ([place])   . Anemia   . Asthma   . Cardiomyopathy ([place])   . CHF (congestive heart failure) ([place])   . Chronic systolic heart failure ([place])   . Complication of anesthesia    hypotension  . COPD (chronic obstructive pulmonary disease) ([place])   . Coronary artery disease   . Dialysis patient ([place])    Mon, Wed, Fri  . End stage renal disease ([place])   . GERD (gastroesophageal reflux disease)   . Headache   . History of kidney stones   . History of pulmonary embolism   . HLD (hyperlipidemia)   . HTN (hypertension)   . Hyperparathyroidism   . Myocardial infarction ([place])   . Peripheral vascular disease ([place])   . Shortness of breath dyspnea   .  Sleep apnea    NO C-PAP, Patient stated in process of  "getting one"   . Tobacco dependence     Medications:  Scheduled:  . aspirin EC  81 mg Oral Daily  . atorvastatin  80 mg Oral q1800  . carvedilol  6.25 mg Oral BID  . Chlorhexidine Gluconate Cloth  6 each Topical Q0600  . Chlorhexidine Gluconate Cloth  6 each Topical Q0600  . erythromycin  1 application Right Eye QHS  . fluticasone  1 spray Each Nare Daily  . isosorbide mononitrate  30 mg Oral Daily  . loratadine  10 mg Oral Daily  . mouth rinse  15 mL Mouth Rinse BID  . midodrine  10 mg Oral Q8H  . multivitamin  1 tablet Oral QHS  . mupirocin ointment  1 application  Nasal BID  . pregabalin  75 mg Oral Daily  . tiotropium  18 mcg Inhalation Daily  . vitamin C  500 mg Oral BID    Assessment: Patient admitted for CP from nursing facility, trops 1.89. Patient takes eliquis 5 mg bid PTA (unclear why) and unsure when last dose was.  Patient is being started on heparin drip for UA/ACS Baseline labs show HL >3.6, hgb 7.6, PT/INR 15.9/1.3  3/17 Patient remains on pantoprazole infusion. Patient had cardiac catheterization (3/17) showing occluded proximal right coronary artery left circumflex artery and significant stenosis of distal left main and LAD at 85%. Patient's heparin resumed at 1630 @ a rate of 850 units/hr. No bolus.  Goal of Therapy:  APTT 66 - 102 seconds Heparin level 0.3-0.7 units/ml Monitor platelets by anticoagulation protocol: Yes   Plan:  3/17 @ 2019 aPTT 60. APTT level slightly subtherapeutic. Will increase heparin infusion to 900 units/hr.  Will order anti-Xa and aPTT level with am labs. Will continue manage heparin via aPTTs until anti-Xa levels correlate with aPTTs.   Pharmacy will continue to monitor and adjust per consult.    Pernell Dupre, PharmD, BCPS Clinical Pharmacist 08/11/2018 9:51 PM

## 2018-08-11 NOTE — Progress Notes (Signed)
Dr. Nehemiah Massed paged regarding restarting Pt heparin Gtt. Per MD restart Gtt 2 hours post procedure at same rate 850 units/hr no bolus. Will update ICU RN to restart gtt at 1555.

## 2018-08-12 ENCOUNTER — Encounter: Payer: Self-pay | Admitting: Internal Medicine

## 2018-08-12 ENCOUNTER — Other Ambulatory Visit: Payer: Medicare Other

## 2018-08-12 LAB — CBC
HEMATOCRIT: 31.4 % — AB (ref 39.0–52.0)
Hemoglobin: 9.6 g/dL — ABNORMAL LOW (ref 13.0–17.0)
MCH: 30.2 pg (ref 26.0–34.0)
MCHC: 30.6 g/dL (ref 30.0–36.0)
MCV: 98.7 fL (ref 80.0–100.0)
NRBC: 0.3 % — AB (ref 0.0–0.2)
Platelets: 189 10*3/uL (ref 150–400)
RBC: 3.18 MIL/uL — ABNORMAL LOW (ref 4.22–5.81)
RDW: 16.6 % — ABNORMAL HIGH (ref 11.5–15.5)
WBC: 8.9 10*3/uL (ref 4.0–10.5)

## 2018-08-12 LAB — RENAL FUNCTION PANEL
Albumin: 3 g/dL — ABNORMAL LOW (ref 3.5–5.0)
Albumin: 2.7 g/dL — ABNORMAL LOW (ref 3.5–5.0)
Anion gap: 12 (ref 5–15)
Anion gap: 9 (ref 5–15)
BUN: 57 mg/dL — ABNORMAL HIGH (ref 8–23)
BUN: 36 mg/dL — ABNORMAL HIGH (ref 8–23)
CO2: 27 mmol/L (ref 22–32)
CO2: 30 mmol/L (ref 22–32)
Calcium: 9.1 mg/dL (ref 8.9–10.3)
Calcium: 8.7 mg/dL — ABNORMAL LOW (ref 8.9–10.3)
Chloride: 100 mmol/L (ref 98–111)
Chloride: 99 mmol/L (ref 98–111)
Creatinine, Ser: 8.81 mg/dL — ABNORMAL HIGH (ref 0.61–1.24)
Creatinine, Ser: 6.74 mg/dL — ABNORMAL HIGH (ref 0.61–1.24)
GFR calc Af Amer: 9 mL/min — ABNORMAL LOW (ref >60)
GFR calc non Af Amer: 8 mL/min — ABNORMAL LOW (ref >60)
GFR, EST AFRICAN AMERICAN: 7 mL/min — AB (ref >60)
GFR, EST NON AFRICAN AMERICAN: 6 mL/min — AB (ref >60)
Glucose, Bld: 110 mg/dL — ABNORMAL HIGH (ref 70–99)
Glucose, Bld: 115 mg/dL — ABNORMAL HIGH (ref 70–99)
PHOSPHORUS: 5.6 mg/dL — AB (ref 2.5–4.6)
Phosphorus: 4.5 mg/dL (ref 2.5–4.6)
Potassium: 5.1 mmol/L (ref 3.5–5.1)
Potassium: 4 mmol/L (ref 3.5–5.1)
Sodium: 139 mmol/L (ref 135–145)
Sodium: 138 mmol/L (ref 135–145)

## 2018-08-12 LAB — PHOSPHORUS: Phosphorus: 3.5 mg/dL (ref 2.5–4.6)

## 2018-08-12 LAB — HEPARIN LEVEL (UNFRACTIONATED): HEPARIN UNFRACTIONATED: 2.2 [IU]/mL — AB (ref 0.30–0.70)

## 2018-08-12 LAB — GLUCOSE, CAPILLARY: Glucose-Capillary: 87 mg/dL (ref 70–99)

## 2018-08-12 LAB — APTT
APTT: 74 s — AB (ref 24–36)
aPTT: 63 seconds — ABNORMAL HIGH (ref 24–36)
aPTT: 69 seconds — ABNORMAL HIGH (ref 24–36)

## 2018-08-12 MED ORDER — EPOETIN ALFA 10000 UNIT/ML IJ SOLN
4000.0000 [IU] | INTRAMUSCULAR | Status: DC
  Administered 2018-08-12 – 2018-08-17 (×3): 4000 [IU] via INTRAVENOUS

## 2018-08-12 MED ORDER — NEPRO/CARBSTEADY PO LIQD
237.0000 mL | Freq: Two times a day (BID) | ORAL | Status: DC
  Administered 2018-08-12 – 2018-08-18 (×8): 237 mL via ORAL

## 2018-08-12 NOTE — Progress Notes (Signed)
[facility] Encounter Note  Patient: [name] / Admit Date: 08/10/2018 / Date of Encounter: 08/12/2018, 8:13 AM   Subjective: Patient had chest pain last night with apparent ST depression by telemetry suggesting continued evidence of ischemia.  Patient has had no further chest pain this morning with appropriate medication management listed below.  Elevated troponin is 7.3 consistent with non-ST elevation myocardial infarction.  Echocardiogram showing severe LV systolic dysfunction with possible mitral valve chordal abnormality with mitral insufficiency and ejection fraction of 20% Cardiac catheterization showing significantly elevated end-diastolic pressure Occluded proximal right coronary artery and occluded proximal left circumflex artery with significant 85% stenosis of left anterior descending artery and left main coronary artery  There are concerns of intervention on significant left main and left anterior descending artery stenosis with a high risk procedure without knowing significance of ischemia of severe anemia now corrected Therefore, further viability and stress test imaging should be evaluated with Lexiscan infusion Myoview Review of Systems: Positive for: Shortness of breath chest pain Negative for: Vision change, hearing change, syncope, dizziness, nausea, vomiting,diarrhea, bloody stool, stomach pain, cough, congestion, diaphoresis, urinary frequency, urinary pain,skin lesions, skin rashes Others previously listed  Objective: Telemetry: Normal sinus rhythm with left bundle branch block Physical Exam: Blood pressure 112/68, pulse 81, temperature 98.2 F (36.8 C), temperature source Oral, resp. rate 20, height 5\' 8"  (1.727 m), weight 96.3 kg, SpO2 98 %. Body mass index is 32.28 kg/m. General: Well developed, well nourished, in no acute distress. Head: Normocephalic, atraumatic, sclera non-icteric, no xanthomas, nares are without discharge. Neck: No  apparent masses Lungs: Normal respirations with no wheezes, no rhonchi, few rales , basilar crackles   Heart: Regular rate and rhythm, normal S1 S2, 2+ apical murmur, no rub, no gallop, PMI is normal size and placement, carotid upstroke normal without bruit, jugular venous pressure normal Abdomen: Soft, non-tender, non-distended with normoactive bowel sounds. No hepatosplenomegaly. Abdominal aorta is normal size without bruit Extremities: 1+ edema, no clubbing, no cyanosis, no ulcers,  Peripheral: 2+ radial, 2+ femoral, 2+ dorsal pedal pulses Neuro: Alert and oriented. Moves all extremities spontaneously. Psych:  Responds to questions appropriately with a normal affect.   Intake/Output Summary (Last 24 hours) at 08/12/2018 0813 Last data filed at 08/12/2018 0700 Gross per 24 hour  Intake 1020.06 ml  Output 0 ml  Net 1020.06 ml    Inpatient Medications:  . aspirin EC  81 mg Oral Daily  . atorvastatin  80 mg Oral q1800  . carvedilol  6.25 mg Oral BID  . Chlorhexidine Gluconate Cloth  6 each Topical Q0600  . Chlorhexidine Gluconate Cloth  6 each Topical Q0600  . erythromycin  1 application Right Eye QHS  . fluticasone  1 spray Each Nare Daily  . isosorbide mononitrate  30 mg Oral Daily  . loratadine  10 mg Oral Daily  . mouth rinse  15 mL Mouth Rinse BID  . midodrine  10 mg Oral Q8H  . multivitamin  1 tablet Oral QHS  . mupirocin ointment  1 application Nasal BID  . pregabalin  75 mg Oral Daily  . tiotropium  18 mcg Inhalation Daily  . vitamin C  500 mg Oral BID   Infusions:  . heparin 900 Units/hr (08/12/18 0300)  . pantoprozole (PROTONIX) infusion 8 mg/hr (08/12/18 0700)    Labs: Recent Labs    08/10/18 0243 08/11/18 0453 08/12/18 0613  NA 138 140 139  K 4.1 4.5 5.1  CL 103 103 100  CO2 21* 27 27  GLUCOSE 131* 88 110*  BUN 67* 39* 57*  CREATININE 10.25* 7.30* 8.81*  CALCIUM 9.1 8.9 9.1  PHOS 5.2*  --  5.6*   Recent Labs    08/10/18 0243 08/12/18 0613  AST 35   --   ALT 29  --   ALKPHOS 82  --   BILITOT 0.3  --   PROT 6.6  --   ALBUMIN 3.0* 3.0*   Recent Labs    08/10/18 0243 08/11/18 0453 08/12/18 0613  WBC 8.9 10.7* 8.9  NEUTROABS 5.5  --   --   HGB 7.6* 9.3* 9.6*  HCT 24.2* 29.6* 31.4*  MCV 100.4* 97.4 98.7  PLT 209 205 189   Recent Labs    08/10/18 0243 08/11/18 0624  TROPONINI 1.89* 7.34*   Invalid input(s): POCBNP No results for input(s): HGBA1C in the last 72 hours.   Weights: Filed Weights   08/10/18 1900 08/11/18 0500 08/12/18 0340  Weight: 92.8 kg 93.1 kg 96.3 kg     Radiology/Studies:  Dg Chest Portable 1 View  Result Date: 08/10/2018 CLINICAL DATA:  Chest pain EXAM: PORTABLE CHEST 1 VIEW COMPARISON:  04/24/2018 FINDINGS: Cardiomegaly. Bilateral airspace disease with small bilateral effusions. Findings likely reflect edema/CHF. No acute bony abnormality. IMPRESSION: Cardiomegaly with bilateral effusions and diffuse bilateral airspace disease, likely edema/CHF. Electronically Signed   By: Rolm Baptise M.D.   On: 08/10/2018 03:09     Assessment and Recommendation  63 y.o. male with known chronic kidney disease stage V hypertension hyperlipidemia coronary artery disease now with acute non-ST elevation myocardial infarction and acute on chronic systolic dysfunction heart failure slightly improved status post GI bleed of unknown origin suspecting upper gastric due to melanic stool now with more stable hemoglobin at 9.3 needing further treatment options Cardiac catheterization with occluded proximal right coronary artery left circumflex artery and significant stenosis of distal left main and LAD at 85% After discussion with interventional cardiology there is concerns that the patient has very high risk features including severe LV dysfunction and severe coronary artery disease.  It would be most appropriate to assess for extent of viability in the anterior myocardial distribution for prove that intervention would be an  appropriate venture. 1.  Continue medication management for anginal symptoms including nitrate and beta-blocker 2.  Continue heparin for further risk reduction myocardial infarction and significant stenosis of LAD and distal left main watching closely for further GI bleed 3.  Continuation of beta-blocker for cardiomyopathy and acute on chronic systolic dysfunction heart failure  5.  High intensity cholesterol therapy  6.  Lexiscan infusion Myoview for further assessment of viability and ischemia of anterior wall to assure need in further intervention of high risk left main left anterior descending artery stenosis after non-ST elevation myocardial infarction in the setting of severe anemia now corrected. signed, Serafina Royals M.D. FACC

## 2018-08-12 NOTE — Progress Notes (Signed)
Pre HD Tx    08/12/18 0853  Neurological  Level of Consciousness Alert  Orientation Level Oriented X4  Respiratory  Respiratory Pattern Regular  Chest Assessment Chest expansion symmetrical  Bilateral Breath Sounds Diminished  Cough None  Cardiac  Pulse Regular  Heart Sounds S1, S2  Cardiac Rhythm NSR;BBB  Vascular  R Radial Pulse +2  L Radial Pulse +2  Edema Generalized  Generalized Edema +1  Psychosocial  Psychosocial (WDL) WDL

## 2018-08-12 NOTE — Progress Notes (Signed)
[place] for heparin Indication: chest pain/ACS  Allergies  Allergen Reactions  . Dust Mite Extract Other (See Comments)    Reaction: unknown    Patient Measurements: Height: 5\' 8"  (172.7 cm) Weight: 210 lb 15.7 oz (95.7 kg) IBW/kg (Calculated) : 68.4 Heparin Dosing Weight: 90.4 kg  Vital Signs: Temp: 98.1 F (36.7 C) (03/18 0930) Temp Source: Oral (03/18 0930) BP: 122/69 (03/18 1315) Pulse Rate: 77 (03/18 1315)  Labs: Recent Labs    08/10/18 0243 08/10/18 0415  08/11/18 0453 08/11/18 0624  08/11/18 2019 08/12/18 0613 08/12/18 1417  HGB 7.6*  --   --  9.3*  --   --   --  9.6*  --   HCT 24.2*  --   --  29.6*  --   --   --  31.4*  --   PLT 209  --   --  205  --   --   --  189  --   APTT  --  36   < > 114*  --    < > 60* 74* 63*  LABPROT  --  15.9*  --  14.9  --   --   --   --   --   INR  --  1.3*  --  1.2  --   --   --   --   --   HEPARINUNFRC  --  >3.60*  --  3.16*  --   --   --  2.20*  --   CREATININE 10.25*  --   --  7.30*  --   --   --  8.81*  --   TROPONINI 1.89*  --   --   --  7.34*  --   --   --   --    < > = values in this interval not displayed.    Estimated Creatinine Clearance: 9.5 mL/min (A) (by C-G formula based on SCr of 8.81 mg/dL (H)).   Medical History: Past Medical History:  Diagnosis Date  . 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 Amputee, below knee, left ([place])   . Anemia   . Asthma   . Cardiomyopathy ([place])   . CHF (congestive heart failure) ([place])   . Chronic systolic heart failure ([place])   . Complication of anesthesia    hypotension  . COPD (chronic obstructive pulmonary disease) ([place])   . Coronary artery disease   . Dialysis patient ([place])    Mon, Wed, Fri  . End stage renal disease ([place])   . GERD (gastroesophageal reflux disease)   . Headache   . History of kidney stones   . History of pulmonary embolism   . HLD (hyperlipidemia)   . HTN (hypertension)   . Hyperparathyroidism    . Myocardial infarction ([place])   . Peripheral vascular disease ([place])   . Shortness of breath dyspnea   . Sleep apnea    NO C-PAP, Patient stated in process of  "getting one"   . Tobacco dependence     Medications:  Scheduled:  . aspirin EC  81 mg Oral Daily  . atorvastatin  80 mg Oral q1800  . carvedilol  6.25 mg Oral BID  . Chlorhexidine Gluconate Cloth  6 each Topical Q0600  . Chlorhexidine Gluconate Cloth  6 each Topical Q0600  . epoetin (EPOGEN/PROCRIT) injection  4,000 Units Intravenous Q M,W,F-HD  . erythromycin  1 application Right Eye QHS  .  feeding supplement (NEPRO CARB STEADY)  237 mL Oral BID BM  . fluticasone  1 spray Each Nare Daily  . isosorbide mononitrate  30 mg Oral Daily  . loratadine  10 mg Oral Daily  . mouth rinse  15 mL Mouth Rinse BID  . midodrine  10 mg Oral Q8H  . multivitamin  1 tablet Oral QHS  . mupirocin ointment  1 application Nasal BID  . pregabalin  75 mg Oral Daily  . tiotropium  18 mcg Inhalation Daily  . vitamin C  500 mg Oral BID    Assessment: Patient admitted for CP from nursing facility, trops 1.89. Patient takes eliquis 5 mg bid PTA (unclear why) and unsure when last dose was.  Patient is being started on heparin drip for UA/ACS Baseline labs show HL >3.6, hgb 7.6, PT/INR 15.9/1.3  Patient remains on pantoprazole infusion. Patient had cardiac catheterization (3/17) showing occluded proximal right coronary artery left circumflex artery and significant stenosis of distal left main and LAD at 85%.   Heparin currently infusing at 900 units/hr.   Goal of Therapy:  APTT 66 - 102 seconds Heparin level 0.3-0.7 units/ml Monitor platelets by anticoagulation protocol: Yes   Plan:  Increase heparin rate to 950 units/hr. Will obtain follow up aPTT at 2230.   Will continue manage heparin via aPTTs until anti-Xa levels correlate with aPTTs. Will obtain next anti-Xa level with am labs.   Pharmacy will continue to monitor and adjust per  consult.   , L 08/12/2018 3:24 PM

## 2018-08-12 NOTE — Consult Note (Signed)
[place] Nurse wound consult note Reason for Consult: RLE wound Patient with transmetatarsal amputation on the right foot.  No wounds noted Patient with BKA on the right, full thickness ulceration posterior aspect of surgical wound Wound type: non healing, full thickness wound, posterior stump, just below the surgical incision Pressure Injury POA: NA Measurement: 2cm x 2cm x 0.2cm  Wound bed: 100% pink, pale  Drainage (amount, consistency, odor) none Periwound:inact, dry skin  Dressing procedure/placement/frequency: Continue silicone foam for protection and insulation. No family in the room, patient minimally responsive, on HD at the time of my assessment.   Discussed POC with patient and bedside nurse.  Re consult if needed, will not follow at this time. Thanks  Carmelia Tiner [employer], RN,CWOCN, CNS, [place] [phone])

## 2018-08-12 NOTE — Progress Notes (Signed)
Patient hemoglobin is stable.  He is status post cardiac cath with significant vessel disease.  Had spoken to cardiology prior to the cardiac cath which it was made clear to me was of paramount importance prior to any GI investigation due to his elevated troponin. Since the patient's hemoglobin is stable and there is no sign of any active bleeding I will hold off on any GI intervention at this time which would be a high risk for this patient.  Please contact GI again if the patient should have any further signs of any GI bleeding.

## 2018-08-12 NOTE — TOC Progression Note (Signed)
Transition of Care [facility]) - Progression Note    Patient Details  Name: [name] MRN: [id number] Date of Birth: 17-Sep-1955  Transition of Care [facility]) CM/SW Contact  Katrina Stack, RN Phone Number: 08/12/2018, 5:52 PM  Clinical Narrative:  During rounds, discussed the need to mobilize patient. Confirmed [facility] was providing care to patient's wound and was also being followed by the wound care center.   Expected Discharge Plan: Assisted Living(with home health care)    Expected Discharge Plan and Services Expected Discharge Plan: Assisted Living(with home health care)     Living arrangements for the past 2 months: Assisted Living Facility                           Social Determinants of Health (SDOH) Interventions    Readmission Risk Interventions 30 Day Unplanned Readmission Risk Score     ED to Hosp-Admission (Current) from 08/10/2018 in [place]  30 Day Unplanned Readmission Risk Score (%)  29 Filed at 08/12/2018 1600     This score is the patient's risk of an unplanned readmission within 30 days of being discharged (0 -100%). The score is based on dignosis, age, lab data, medications, orders, and past utilization.   Low:  0-14.9   Medium: 15-21.9   High: 22-29.9   Extreme: 30 and above       Readmission Risk Prevention Plan 04/24/2018  Transportation Screening Complete  Medication Review [employer]) Complete  PCP or Specialist appointment within 3-5 days of discharge Complete  HRI or Home Care Consult Complete  SW Recovery Care/Counseling Consult Complete  Palliative Care Screening Not Complete  Medication Reconcilation (Pharmacy) Complete  Skilled [place] Not Complete  Some recent data might be hidden

## 2018-08-12 NOTE — Progress Notes (Signed)
[facility]  ROUNDING NOTE   Subjective:  Patient seen at bedside. Still on heparin drip. Due for hemodialysis today.   Objective:  Vital signs in last 24 hours:  Temp:  [98.2 F (36.8 C)-99.5 F (37.5 C)] 98.2 F (36.8 C) (03/18 0400) Pulse Rate:  [71-88] 81 (03/18 0600) Resp:  [0-20] 20 (03/18 0600) BP: (92-132)/(65-101) 112/68 (03/18 0600) SpO2:  [92 %-100 %] 98 % (03/18 0600) Weight:  [96.3 kg] 96.3 kg (03/18 0340)  Weight change: 1.2 kg Filed Weights   08/10/18 1900 08/11/18 0500 08/12/18 0340  Weight: 92.8 kg 93.1 kg 96.3 kg    Intake/Output: I/O last 3 completed shifts: In: 1449.5 [P.O.:200; I.V.:1249.5] Out: 0    Intake/Output this shift:  No intake/output data recorded.  Physical Exam: General: No acute distress  Head: Normocephalic, atraumatic. Moist oral mucosal membranes  Eyes: Anicteric  Neck: Supple, trachea midline  Lungs:  Clear to auscultation, normal effort  Heart: S1S2 no rubs  Abdomen:  Soft, nontender, bowel sounds present  Extremities: Trace peripheral edema, L BKA  Neurologic: Awake, alert, following commands  Skin: No lesions  Access: LUE AVF    Basic Metabolic Panel: Recent Labs  Lab 08/10/18 0243 08/11/18 0453 08/12/18 0613  NA 138 140 139  K 4.1 4.5 5.1  CL 103 103 100  CO2 21* 27 27  GLUCOSE 131* 88 110*  BUN 67* 39* 57*  CREATININE 10.25* 7.30* 8.81*  CALCIUM 9.1 8.9 9.1  PHOS 5.2*  --  5.6*    Liver Function Tests: Recent Labs  Lab 08/10/18 0243 08/12/18 0613  AST 35  --   ALT 29  --   ALKPHOS 82  --   BILITOT 0.3  --   PROT 6.6  --   ALBUMIN 3.0* 3.0*   Recent Labs  Lab 08/10/18 0243  LIPASE 86*   No results for input(s): AMMONIA in the last 168 hours.  CBC: Recent Labs  Lab 08/10/18 0243 08/11/18 0453 08/12/18 0613  WBC 8.9 10.7* 8.9  NEUTROABS 5.5  --   --   HGB 7.6* 9.3* 9.6*  HCT 24.2* 29.6* 31.4*  MCV 100.4* 97.4 98.7  PLT 209 205 189    Cardiac Enzymes: Recent Labs   Lab 08/10/18 0243 08/11/18 0624  TROPONINI 1.89* 7.34*    BNP: Invalid input(s): POCBNP  CBG: Recent Labs  Lab 08/10/18 0644  GLUCAP 104*    Microbiology: Results for orders placed or performed during the hospital encounter of 08/10/18  MRSA PCR Screening     Status: Abnormal   Collection Time: 08/10/18  6:54 AM  Result Value Ref Range Status   MRSA by PCR POSITIVE (A) NEGATIVE Final    Comment:        The GeneXpert MRSA Assay (FDA approved for NASAL specimens only), is one component of a comprehensive MRSA colonization surveillance program. It is not intended to diagnose MRSA infection nor to guide or monitor treatment for MRSA infections. RESULT CALLED TO, READ BACK BY AND VERIFIED WITH:  [facility] [place] AT 1914 08/10/18 KM/SDR Performed at [facility], [address] [place], [place] [address]     Coagulation Studies: Recent Labs    08/10/18 0415 08/11/18 0453  LABPROT 15.9* 14.9  INR 1.3* 1.2    Urinalysis: No results for input(s): COLORURINE, LABSPEC, PHURINE, GLUCOSEU, HGBUR, BILIRUBINUR, KETONESUR, PROTEINUR, UROBILINOGEN, NITRITE, LEUKOCYTESUR in the last 72 hours.  Invalid input(s): APPERANCEUR    Imaging: No results found.   Medications:   .  heparin 900 Units/hr (08/12/18 0300)  . pantoprozole (PROTONIX) infusion 8 mg/hr (08/12/18 0700)   . aspirin EC  81 mg Oral Daily  . atorvastatin  80 mg Oral q1800  . carvedilol  6.25 mg Oral BID  . Chlorhexidine Gluconate Cloth  6 each Topical Q0600  . Chlorhexidine Gluconate Cloth  6 each Topical Q0600  . erythromycin  1 application Right Eye QHS  . fluticasone  1 spray Each Nare Daily  . isosorbide mononitrate  30 mg Oral Daily  . loratadine  10 mg Oral Daily  . mouth rinse  15 mL Mouth Rinse BID  . midodrine  10 mg Oral Q8H  . multivitamin  1 tablet Oral QHS  . mupirocin ointment  1 application Nasal BID  . pregabalin  75 mg Oral Daily  . tiotropium  18 mcg Inhalation Daily   . vitamin C  500 mg Oral BID   acetaminophen, albuterol, docusate sodium, guaiFENesin, ipratropium-albuterol, morphine injection, nitroGLYCERIN, ondansetron (ZOFRAN) IV, polyethylene glycol, traMADol, zolpidem  Assessment/ Plan:  63 y.o. male with ESRD on hemodialysis, hypertension, hyperlipidemia, anemia, COPD, peripheral vascular disease, left BKA, right toe amputations  MWF [facility]ph[facility].   1.  End-stage renal disease 2.  Chest pain. 3.  Anemia of chronic kidney disease 4.  Chronic hypotension 5.  Secondary hyperparathyroidism  Plan: Patient due for hemodialysis today.  Orders have been prepared.  We will start the patient on Epogen 4000 units IV with dialysis.  Serum phosphorus currently 5.6 and close to target.  We will continue to monitor this as well.  In addition patient will be maintained on midodrine for chronic hypotension.   LOS: 2 [place] 3/18/20208:53 AM

## 2018-08-12 NOTE — Progress Notes (Signed)
Patient declined CPAP at this time. Resting comfortably in bed on 3L [place] SAT at 100%.

## 2018-08-12 NOTE — Progress Notes (Signed)
HD Tx started w/o complication   99/06/89 3406  Vital Signs  Pulse Rate 79  Pulse Rate Source Monitor  Resp 11  BP 117/78  BP Location Right Arm  BP Method Automatic  Patient Position (if appropriate) Lying  Oxygen Therapy  SpO2 99 %  O2 Device Nasal Cannula  O2 Flow Rate (L/min) 4 L/min  Pulse Oximetry Type Continuous  During Hemodialysis Assessment  Blood Flow Rate (mL/min) 400 mL/min  Arterial Pressure (mmHg) -80 mmHg  Venous Pressure (mmHg) 210 mmHg  Transmembrane Pressure (mmHg) 70 mmHg  Ultrafiltration Rate (mL/min) 500 mL/min  Dialysate Flow Rate (mL/min) 800 ml/min  Conductivity: Machine  13.8  HD Safety Checks Performed Yes  Dialysis Fluid Bolus Normal Saline  Bolus Amount (mL) 250 mL  Intra-Hemodialysis Comments Tx initiated

## 2018-08-12 NOTE — Progress Notes (Signed)
[facility] [place] NAME: [name]    MR#:  [id number]  DATE OF BIRTH:  January 31, 1956  SUBJECTIVE:  CHIEF COMPLAINT:   Chief Complaint  Patient presents with  . Chest Pain   Came with chest pain.  Noted to have low hemoglobin and guaiac positive stool.  Also had elevated troponin so admitted to ICU with borderline blood pressure on heparin drip and  blood transfusion. No complains,. For Cath today.  REVIEW OF SYSTEMS:  CONSTITUTIONAL: No fever, fatigue or weakness.  EYES: No blurred or double vision.  EARS, NOSE, AND THROAT: No tinnitus or ear pain.  RESPIRATORY: No cough, shortness of breath, wheezing or hemoptysis.  CARDIOVASCULAR: Have chest pain, no orthopnea, edema.  GASTROINTESTINAL: No nausea, vomiting, diarrhea or abdominal pain.  GENITOURINARY: No dysuria, hematuria.  ENDOCRINE: No polyuria, nocturia,  HEMATOLOGY: No anemia, easy bruising or bleeding SKIN: No rash or lesion. MUSCULOSKELETAL: No joint pain or arthritis.   NEUROLOGIC: No tingling, numbness, weakness.  PSYCHIATRY: No anxiety or depression.   ROS  DRUG ALLERGIES:   Allergies  Allergen Reactions  . Dust Mite Extract Other (See Comments)    Reaction: unknown    VITALS:  Blood pressure 112/68, pulse 81, temperature 98.2 F (36.8 C), temperature source Oral, resp. rate 20, height 5\' 8"  (1.727 m), weight 96.3 kg, SpO2 98 %.  PHYSICAL EXAMINATION:  GENERAL:  63 y.o.-year-old patient lying in the bed with no acute distress.  EYES: Pupils equal, round, reactive to light and accommodation. No scleral icterus. Extraocular muscles intact.  HEENT: Head atraumatic, normocephalic. Oropharynx and nasopharynx clear.  NECK:  Supple, no jugular venous distention. No thyroid enlargement, no tenderness.  LUNGS: Normal breath sounds bilaterally, no wheezing, rales,rhonchi or crepitation. No use of accessory muscles of respiration.  CARDIOVASCULAR: S1, S2 normal. No  murmurs, rubs, or gallops.  ABDOMEN: Soft, nontender, nondistended. Bowel sounds present. No organomegaly or mass.  EXTREMITIES: No pedal edema, cyanosis, or clubbing.  NEUROLOGIC: Cranial nerves II through XII are intact. Muscle strength 4/5 in all extremities. Sensation intact. Gait not checked.  PSYCHIATRIC: The patient is alert and oriented x 3.  SKIN: No obvious rash, lesion, or ulcer.   Physical Exam LABORATORY PANEL:   CBC Recent Labs  Lab 08/12/18 0613  WBC 8.9  HGB 9.6*  HCT 31.4*  PLT 189   ------------------------------------------------------------------------------------------------------------------  Chemistries  Recent Labs  Lab 08/10/18 0243  08/12/18 0613  NA 138   < > 139  K 4.1   < > 5.1  CL 103   < > 100  CO2 21*   < > 27  GLUCOSE 131*   < > 110*  BUN 67*   < > 57*  CREATININE 10.25*   < > 8.81*  CALCIUM 9.1   < > 9.1  AST 35  --   --   ALT 29  --   --   ALKPHOS 82  --   --   BILITOT 0.3  --   --    < > = values in this interval not displayed.   ------------------------------------------------------------------------------------------------------------------  Cardiac Enzymes Recent Labs  Lab 08/10/18 0243 08/11/18 0624  TROPONINI 1.89* 7.34*   ------------------------------------------------------------------------------------------------------------------  RADIOLOGY:  No results found.  ASSESSMENT AND PLAN:   Active Problems:   Non-STEMI (non-ST elevated myocardial infarction) (HCC)   Acute GI bleeding  #1.  None-ST elevation MI. on IV heparin per Dr. Alveria Apley recommendation while transfusing the patient 2 units  of packed red blood cells for his anemia.  follow serial cardiac enzymes and EKGs.   on IV nitroglycerin drip as well as PRN IV morphine sulfate.    holding off Eliquis in addition to aspirin given his GI bleeding.    2D echo will be obtained. Start betablocker and statin. Plan for cardiac cath today as stable.  2.   GI bleeding with acute blood loss anemia.  This is likely acute on top of chronic anemia of end-stage renal disease.  strongly positive stool Hemoccult.    follow hemoglobin hematocrit levels.    transfused 2 units of packed red blood cells.  Dr. Nehemiah Massed is aware about his GI bleeding and given his acute non-STEMI recommended IV heparin while transfusing the patient.    aspirin and Eliquis will be held off. Appreciated GI consult, plan for procedures after cleared from cardiology point.  3.  End-stage renal disease on hemodialysis likely with associated fluid overload could be related to acute on top of chronic systolic CHF.  Nephrology consultation   PhosLo and Renvela will be continued.  Cont HD per nephro inpatient.  4.  COPD.  The Spiriva will be continued and he will be placed on continued care and Combivent.  5.  Dyslipidemia.  Lipitor dose will be increased .  6.  Hypertension.  His Coreg will be resumed.  We will monitor his blood pressure given usual hypotension.  7.  DVT prophylaxis.  This is covered with IV heparin and GI prophylaxis was addressed above pain of the     All the records are reviewed and case discussed with Care Management/Social Workerr. Management plans discussed with the patient, family and they are in agreement.  CODE STATUS: Full  TOTAL TIME TAKING CARE OF THIS PATIENT: 35 minutes.     POSSIBLE D/C IN2-3 DAYS, DEPENDING ON CLINICAL CONDITION.   Vaughan Basta M.D on 08/12/2018   Between 7am to 6pm - Pager - [phone]  After 6pm go to [email or web address] - password [email or web address] [facility]  Office  [phone]  CC: Primary care physician; Donnie Coffin, MD  Note: This dictation was prepared with Dragon dictation along with smaller phrase technology. Any transcriptional errors that result from this process are unintentional.

## 2018-08-12 NOTE — Progress Notes (Signed)
CRITICAL CARE NOTE        SUBJECTIVE FINDINGS & SIGNIFICANT EVENTS   Patient remains acutely ill - will consider downgrade to tele post HD today ACS- s/p LHC - on AC-cards eval in process LGIB- serial H/H-GI eval pending Diabetic ulcer- right TMA - wound consult Full code  PAST MEDICAL HISTORY   Past Medical History:  Diagnosis Date  . 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 Amputee, below knee, left ([place])   . Anemia   . Asthma   . Cardiomyopathy ([place])   . CHF (congestive heart failure) ([place])   . Chronic systolic heart failure ([place])   . Complication of anesthesia    hypotension  . COPD (chronic obstructive pulmonary disease) ([place])   . Coronary artery disease   . Dialysis patient ([place])    Mon, Wed, Fri  . End stage renal disease ([place])   . GERD (gastroesophageal reflux disease)   . Headache   . History of kidney stones   . History of pulmonary embolism   . HLD (hyperlipidemia)   . HTN (hypertension)   . Hyperparathyroidism   . Myocardial infarction ([place])   . Peripheral vascular disease ([place])   . Shortness of breath dyspnea   . Sleep apnea    NO C-PAP, Patient stated in process of  "getting one"   . Tobacco dependence      SURGICAL HISTORY   Past Surgical History:  Procedure Laterality Date  . A/V FISTULAGRAM Left 08/14/2017   Procedure: A/V FISTULAGRAM;  Surgeon: Algernon Huxley, MD;  Location: [facility];  Service: Cardiovascular;  Laterality: Left;  . AMPUTATION Left 05/06/2014   Procedure: AMPUTATION BELOW KNEE;  Surgeon: Elam Dutch, MD;  Location: [facility];  Service: Vascular;  Laterality: Left;  . AMPUTATION Right 01/12/2015   Procedure: Foot transmetatarsal amputation;  Surgeon: Algernon Huxley, MD;  Location: [facility];  Service: Vascular;  Laterality: Right;  .  APPLICATION OF WOUND VAC Right 03/01/2015   Procedure: Application of Bio-connekt graft and wound vac application to right foot ;  Surgeon: Algernon Huxley, MD;  Location: [facility];  Service: Vascular;  Laterality: Right;  . AV FISTULA PLACEMENT Left   . AV FISTULA PLACEMENT Left 11/28/2016   Procedure: ARTERIOVENOUS (AV) FISTULA CREATION;  Surgeon: Algernon Huxley, MD;  Location: [facility];  Service: Vascular;  Laterality: Left;  . CARDIAC CATHETERIZATION     stent placement   . CORONARY ANGIOPLASTY    . DIALYSIS/PERMA CATHETER INSERTION N/A 07/31/2016   Procedure: Dialysis/Perma Catheter Insertion;  Surgeon: Algernon Huxley, MD;  Location: [facility];  Service: Cardiovascular;  Laterality: N/A;  . DIALYSIS/PERMA CATHETER INSERTION N/A 07/25/2017   Procedure: DIALYSIS/PERMA CATHETER INSERTION and fistulagram;  Surgeon: Algernon Huxley, MD;  Location: [facility];  Service: Cardiovascular;  Laterality: N/A;  . DIALYSIS/PERMA CATHETER REMOVAL N/A 07/22/2017   Procedure: DIALYSIS/PERMA CATHETER REMOVAL;  Surgeon: Katha Cabal, MD;  Location: [facility];  Service: Cardiovascular;  Laterality: N/A;  . DIALYSIS/PERMA CATHETER REMOVAL N/A 02/05/2018   Procedure: DIALYSIS/PERMA CATHETER REMOVAL;  Surgeon: Algernon Huxley, MD;  Location: [facility];  Service: Cardiovascular;  Laterality: N/A;  . IR FLUORO GUIDE CV LINE LEFT  04/10/2017  . LEFT HEART CATH AND CORONARY ANGIOGRAPHY N/A 08/11/2018   Procedure: LEFT HEART CATH AND CORONARY ANGIOGRAPHY;  Surgeon: Corey Skains, MD;  Location: [facility];  Service:  Cardiovascular;  Laterality: N/A;  . LIGATION OF ARTERIOVENOUS  FISTULA Right 01/31/2016   Procedure: LIGATION OF ARTERIOVENOUS  FISTULA;  Surgeon: Algernon Huxley, MD;  Location: [facility];  Service: Vascular;  Laterality: Right;  . PERIPHERAL VASCULAR CATHETERIZATION Right 12/15/2014   Procedure: Lower Extremity Angiography;  Surgeon: Algernon Huxley, MD;  Location: [facility];  Service: Cardiovascular;  Laterality: Right;  . PERIPHERAL VASCULAR CATHETERIZATION  12/15/2014   Procedure: Lower Extremity Intervention;  Surgeon: Algernon Huxley, MD;  Location: [facility];  Service: Cardiovascular;;  . PERIPHERAL VASCULAR CATHETERIZATION Right 08/14/2015   Procedure: A/V Shuntogram/Fistulagram;  Surgeon: Algernon Huxley, MD;  Location: [facility];  Service: Cardiovascular;  Laterality: Right;  . PERIPHERAL VASCULAR CATHETERIZATION N/A 08/14/2015   Procedure: A/V Shunt Intervention;  Surgeon: Algernon Huxley, MD;  Location: [facility];  Service: Cardiovascular;  Laterality: N/A;  . PERIPHERAL VASCULAR CATHETERIZATION N/A 01/11/2016   Procedure: Dialysis/Perma Catheter Insertion;  Surgeon: Algernon Huxley, MD;  Location: [facility];  Service: Cardiovascular;  Laterality: N/A;  . REVISON OF ARTERIOVENOUS FISTULA Right 02/17/2016   Procedure: removal of AV fistula;  Surgeon: Serafina Mitchell, MD;  Location: [facility];  Service: Vascular;  Laterality: Right;  . REVISON OF ARTERIOVENOUS FISTULA Right 01/31/2016   Procedure: REVISON OF ARTERIOVENOUS FISTULA ( BRACHIOCEPHALIC ) W/ ARTEGRAFT;  Surgeon: Algernon Huxley, MD;  Location: [facility];  Service: Vascular;  Laterality: Right;  . TRANSMETATARSAL AMPUTATION Right 05/04/2015   Procedure: TRANSMETATARSAL AMPUTATION REVISION, great toe amputation;  Surgeon: Algernon Huxley, MD;  Location: [facility];  Service: Vascular;  Laterality: Right;     FAMILY HISTORY   Family History  Problem Relation Age of Onset  . Leukemia Mother   . Heart attack Father   . Heart failure Other   . Hypertension Other   . Leukemia Other   . Diabetes Other   . Prostate cancer Neg Hx   . Kidney cancer Neg Hx   . Bladder Cancer Neg Hx      SOCIAL HISTORY   Social History   Tobacco Use  . Smoking status: Light Tobacco Smoker    Packs/day: 0.25    Years: 30.00    Pack years: 7.50    Types: Cigarettes  . Smokeless  tobacco: Never Used  . Tobacco comment: 5  Substance Use Topics  . Alcohol use: No    Alcohol/week: 0.0 standard drinks  . Drug use: No    Comment: has used crack cocaine in past      MEDICATIONS   Current Medication:  Current Facility-Administered Medications:  .  acetaminophen (TYLENOL) tablet 650 mg, 650 mg, Oral, Q4H PRN, Mansy, Jan A, MD .  albuterol (PROVENTIL) (2.5 MG/3ML) 0.083% nebulizer solution 2.5 mg, 2.5 mg, Inhalation, Q6H PRN, Mansy, Jan A, MD .  aspirin EC tablet 81 mg, 81 mg, Oral, Daily, Mansy, Jan A, MD, 81 mg at 08/11/18 1000 .  atorvastatin (LIPITOR) tablet 80 mg, 80 mg, Oral, q1800, Mansy, Jan A, MD, 80 mg at 08/11/18 1755 .  carvedilol (COREG) tablet 6.25 mg, 6.25 mg, Oral, BID, Mansy, Jan A, MD, 6.25 mg at 08/11/18 2257 .  Chlorhexidine Gluconate Cloth 2 % PADS 6 each, 6 each, Topical, Q0600, Murlean Iba, MD, 6 each at 08/12/18 0554 .  Chlorhexidine Gluconate Cloth 2 % PADS 6 each, 6 each, Topical, Q0600, Ottie Glazier, MD, 6 each at 08/12/18 0554 .  docusate sodium (  COLACE) capsule 100 mg, 100 mg, Oral, Daily PRN, Mansy, Jan A, MD .  epoetin alfa (EPOGEN,PROCRIT) injection 4,000 Units, 4,000 Units, Intravenous, Q M,W,F-HD, Lateef, Munsoor, MD .  erythromycin ophthalmic ointment 1 application, 1 application, Right Eye, QHS, Mansy, Jan A, MD .  fluticasone (FLONASE) 50 MCG/ACT nasal spray 1 spray, 1 spray, Each Nare, Daily, Mansy, Jan A, MD, 1 spray at 08/11/18 1000 .  guaiFENesin (ROBITUSSIN) 100 MG/5ML solution 200 mg, 200 mg, Oral, TID PRN, Mansy, Jan A, MD .  heparin ADULT infusion 100 units/mL (25000 units/24mL sodium chloride 0.45%), 900 Units/hr, Intravenous, Continuous, Hallaji, Sheema M, RPH, Last Rate: 9 mL/hr at 08/12/18 0300, 900 Units/hr at 08/12/18 0300 .  ipratropium-albuterol (DUONEB) 0.5-2.5 (3) MG/3ML nebulizer solution 3 mL, 3 mL, Inhalation, Q6H PRN, Mansy, Jan A, MD .  isosorbide mononitrate (IMDUR) 24 hr tablet 30 mg, 30 mg, Oral, Daily,  Mansy, Jan A, MD, 30 mg at 08/10/18 1014 .  loratadine (CLARITIN) tablet 10 mg, 10 mg, Oral, Daily, Mansy, Jan A, MD, 10 mg at 08/10/18 1014 .  MEDLINE mouth rinse, 15 mL, Mouth Rinse, BID, Harout Scheurich, MD, 15 mL at 08/11/18 1000 .  midodrine (PROAMATINE) tablet 10 mg, 10 mg, Oral, Q8H, Mansy, Jan A, MD, 10 mg at 08/12/18 0554 .  morphine 2 MG/ML injection 2 mg, 2 mg, Intravenous, Q4H PRN, Darel Hong D, NP .  multivitamin (RENA-VIT) tablet 1 tablet, 1 tablet, Oral, QHS, Charlett Nose, RPH, 1 tablet at 08/11/18 2257 .  mupirocin ointment (BACTROBAN) 2 % 1 application, 1 application, Nasal, BID, Ott[place] Glazier, MD, 1 application at 53/97/67 2300 .  nitroGLYCERIN (NITROSTAT) SL tablet 0.4 mg, 0.4 mg, Sublingual, Q5 min PRN, Darel Hong D, NP, 0.4 mg at 08/11/18 0604 .  ondansetron (ZOFRAN) injection 4 mg, 4 mg, Intravenous, Q6H PRN, Mansy, Jan A, MD .  pantoprazole (PROTONIX) 80 mg in sodium chloride 0.9 % 250 mL (0.32 mg/mL) infusion, 8 mg/hr, Intravenous, Continuous, Carrie Mew, MD, Last Rate: 25 mL/hr at 08/12/18 0700, 8 mg/hr at 08/12/18 0700 .  polyethylene glycol (MIRALAX / GLYCOLAX) packet 17 g, 17 g, Oral, Daily PRN, Mansy, Jan A, MD .  pregabalin (LYRICA) capsule 75 mg, 75 mg, Oral, Daily, Mansy, Jan A, MD, 75 mg at 08/10/18 1014 .  tiotropium (SPIRIVA) inhalation capsule ([facility] use ONLY) 18 mcg, 18 mcg, Inhalation, Daily, Mansy, Jan A, MD, 18 mcg at 08/11/18 1000 .  traMADol (ULTRAM) tablet 75 mg, 75 mg, Oral, Q8H PRN, Mansy, Jan A, MD, 75 mg at 08/11/18 0530 .  vitamin C (ASCORBIC ACID) tablet 500 mg, 500 mg, Oral, BID, [place]lett Nose, RPH, 500 mg at 08/11/18 2257 .  zolpidem (AMBIEN) tablet 5 mg, 5 mg, Oral, QHS PRN,MR X 1, Mansy, Jan A, MD, 5 mg at 08/11/18 2257    ALLERGIES   Dust mite extract    REVIEW OF SYSTEMS    10 system ROS neg except as per subjective findings  PHYSICAL EXAMINATION   Vitals:   08/12/18 0500 08/12/18 0600  BP: 126/84  112/68  Pulse: 77 81  Resp: 10 20  Temp:    SpO2: 98% 98%    GENERAL:NAD HEAD: Normocephalic, atraumatic.  EYES: Pupils equal, round, reactive to light.  No scleral icterus.  MOUTH: Moist mucosal membrane. NECK: Supple. No thyromegaly. No nodules. No JVD.  PULMONARY: decreased breath sounds CARDIOVASCULAR: S1 and S2. Regular rate and rhythm. No murmurs, rubs, or gallops.  GASTROINTESTINAL: Soft, nontender, non-distended. No masses. Positive  bowel sounds. No hepatosplenomegaly.  MUSCULOSKELETAL: No swelling, clubbing, or edema.  NEUROLOGIC: Mild distress due to acute illness SKIN:intact,warm,dry   LABS AND IMAGING     -I personally reviewed most recent blood work, imaging and microbiology - significant findings today are  ESRD, anemia  LAB RESULTS: Recent Labs  Lab 08/10/18 0243 08/11/18 0453 08/12/18 0613  NA 138 140 139  K 4.1 4.5 5.1  CL 103 103 100  CO2 21* 27 27  BUN 67* 39* 57*  CREATININE 10.25* 7.30* 8.81*  GLUCOSE 131* 88 110*   Recent Labs  Lab 08/10/18 0243 08/11/18 0453 08/12/18 0613  HGB 7.6* 9.3* 9.6*  HCT 24.2* 29.6* 31.4*  WBC 8.9 10.7* 8.9  PLT 209 205 189     IMAGING RESULTS: No results found.    ASSESSMENT AND PLAN   Multidisciplinary rounds held today   Acute blood loss anemia - Hematochezia - likely related to oral anticoagulant agent - H/H trending -PRBC transfusion with goal Hb>8 due to active CAD - GI evaluation -poss EGD/Cscope post cardio clearance  AcuteHypoxic Respiratory Failure -continue supportive measures - [place] O2 goal O2 saturation 90%-noncompliant with cpap - currenlty on 4Lnc  -continue Bronchodilator Therapy    CARDIAC FAILURE- -NSTEMI - Cardiology on case - appreciate input-status post left heart cath  -follow up cardiac enzymes as indicated ICU monitoring  Renal Failure-CKD4 -Nephrology on case - appreciate input - s/p HD   ID -continue IV abx as prescibed-nasal MRSA + -follow up  cultures  GI/Nutrition GI PROPHYLAXIS as indicated DIET-->TF's as tolerated Constipation protocol as indicated  ENDO - ICU hypoglycemic\Hyperglycemia protocol -check FSBS per protocol   ELECTROLYTES -follow labs as needed -replace as needed -pharmacy consultation   DVT/GI PRX ordered -SCDs  TRANSFUSIONS AS NEEDED MONITOR FSBS ASSESS the need for LABS as needed   Critical care provider statement:    Critical care time (minutes):  35   Critical care time was exclusive of:  Separately billable procedures and treating other patients   Critical care was necessary to treat or prevent imminent or life-threatening deterioration of the following conditions:  ACS, LGIB, hypoxemia, DM comorbid    Critical care was time spent personally by me on the following activities:  Development of treatment plan with patient or surrogate, discussions with consultants, evaluation of patient's response to treatment, examination of patient, obtaining history from patient or surrogate, ordering and performing treatments and interventions, ordering and review of laboratory studies and re-evaluation of patient's condition.  I assumed direction of critical care for this patient from another provider in my specialty: no    This document was prepared using Dragon voice recognition software and may include unintentional dictation errors.    Ottie Glazier, M.D.  Division of [place]

## 2018-08-12 NOTE — Progress Notes (Signed)
HD Tx completed, tolerated well.    08/12/18 1303  Vital Signs  Pulse Rate 77  Resp (!) 9  BP 127/78  Oxygen Therapy  SpO2 100 %  O2 Device Nasal Cannula  O2 Flow Rate (L/min) 4 L/min  Pulse Oximetry Type Continuous  During Hemodialysis Assessment  HD Safety Checks Performed Yes  KECN 78.6 KECN  Dialysis Fluid Bolus Normal Saline  Bolus Amount (mL) 250 mL  Intra-Hemodialysis Comments Tx completed;Tolerated well

## 2018-08-12 NOTE — Progress Notes (Signed)
Post HD Tx    08/12/18 1315  Vital Signs  Pulse Rate 77  Resp 11  BP 122/69  Oxygen Therapy  SpO2 97 %  O2 Device Nasal Cannula  O2 Flow Rate (L/min) 4 L/min  Pulse Oximetry Type Continuous  Pain Assessment  Pain Scale 0-10  Pain Score 0  Dialysis Weight  Weight 95.7 kg  Type of Weight Post-Dialysis  Post-Hemodialysis Assessment  Rinseback Volume (mL) 250 mL  KECN 78.6 V  Dialyzer Clearance Lightly streaked  Duration of HD Treatment -hour(s) 3.5 hour(s)  Hemodialysis Intake (mL) 500 mL  UF Total -Machine (mL) 2000 mL  Net UF (mL) 1500 mL  Tolerated HD Treatment Yes  AVG/AVF Arterial Site Held (minutes) 10 minutes  AVG/AVF Venous Site Held (minutes) 10 minutes  Fistula / Graft Left Upper arm Arteriovenous fistula  No Placement Date or Time found.   Placed prior to admission: Yes  Orientation: Left  Access Location: Upper arm  Access Type: Arteriovenous fistula  Site Condition No complications  Fistula / Graft Assessment Present;Thrill;Bruit  Status Deaccessed  Drainage Description None

## 2018-08-12 NOTE — Progress Notes (Signed)
Pre HD Tx    08/12/18 0930  Vital Signs  Temp 98.1 F (36.7 C)  Temp Source Oral  Pulse Rate 79  Pulse Rate Source Monitor  Resp 11  BP 118/77  BP Location Right Arm  BP Method Automatic  Patient Position (if appropriate) Lying  Oxygen Therapy  SpO2 99 %  O2 Device Nasal Cannula  O2 Flow Rate (L/min) 4 L/min  Pulse Oximetry Type Continuous  Pain Assessment  Pain Scale 0-10  Pain Score 0  Dialysis Weight  Weight 97.2 kg  Type of Weight Pre-Dialysis  Time-Out for Hemodialysis  What Procedure? HD  Pt Identifiers(min of two) First/Last Name;MRN/Account#  Correct Site? Yes  Correct Side? Yes  Correct Procedure? Yes  Consents Verified? Yes  Rad Studies Available? N/A  Safety Precautions Reviewed? Yes  Engineer, civil (consulting) Number 1  Station Number  (Bedside ICU 14 )  UF/Alarm Test Passed  Conductivity: Meter 134  Conductivity: Machine  13.9  pH 7.4  Reverse Osmosis Portable   Normal Saline Lot Number [id number]  Dialyzer Lot Number [id number]  Disposable Set Lot Number [id number]  Machine Temperature 98.6 F (37 C)  Musician and Audible Yes  Blood Lines Intact and Secured Yes  Pre Treatment Patient Checks  Vascular access used during treatment Fistula  Hepatitis B Surface Antigen Results Negative  Date Hepatitis B Surface Antigen Drawn 11/17/17  Hepatitis B Surface Antibody  (>10)  Date Hepatitis B Surface Antibody Drawn 11/17/17  Hemodialysis Consent Verified Yes  Hemodialysis Standing Orders Initiated Yes  ECG (Telemetry) Monitor On Yes  Prime Ordered Normal Saline  Length of  DialysisTreatment -hour(s) 3.5 Hour(s)  Dialysis Treatment Comments Na 140  Dialyzer Elisio 17H NR  Dialysate 2K, 2.5 Ca  Dialysis Anticoagulant None  Dialysate Flow Ordered 800  Blood Flow Rate Ordered 400 mL/min  Ultrafiltration Goal 1 Liters  Pre Treatment Labs Phosphorus  Dialysis Blood Pressure Support Ordered Normal Saline

## 2018-08-12 NOTE — Progress Notes (Signed)
[facility] for heparin Indication: chest pain/ACS  Allergies  Allergen Reactions  . Dust Mite Extract Other (See Comments)    Reaction: unknown    Patient Measurements: Height: 5\' 8"  (172.7 cm) Weight: 212 lb 4.9 oz (96.3 kg) IBW/kg (Calculated) : 68.4 Heparin Dosing Weight: 90.4 kg  Vital Signs: Temp: 98.2 F (36.8 C) (03/18 0400) Temp Source: Oral (03/18 0400) BP: 112/68 (03/18 0600) Pulse Rate: 81 (03/18 0600)  Labs: Recent Labs    08/10/18 0243 08/10/18 0415  08/11/18 0453 08/11/18 0624 08/11/18 1534 08/11/18 2019 08/12/18 0613  HGB 7.6*  --   --  9.3*  --   --   --  9.6*  HCT 24.2*  --   --  29.6*  --   --   --  31.4*  PLT 209  --   --  205  --   --   --  189  APTT  --  36   < > 114*  --  40* 60* 74*  LABPROT  --  15.9*  --  14.9  --   --   --   --   INR  --  1.3*  --  1.2  --   --   --   --   HEPARINUNFRC  --  >3.60*  --  3.16*  --   --   --   --   CREATININE 10.25*  --   --  7.30*  --   --   --   --   TROPONINI 1.89*  --   --   --  7.34*  --   --   --    < > = values in this interval not displayed.    Estimated Creatinine Clearance: 11.5 mL/min (A) (by C-G formula based on SCr of 7.3 mg/dL (H)).   Medical History: Past Medical History:  Diagnosis Date  . 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 Amputee, below knee, left ([place])   . Anemia   . Asthma   . Cardiomyopathy ([place])   . CHF (congestive heart failure) ([place])   . Chronic systolic heart failure ([place])   . Complication of anesthesia    hypotension  . COPD (chronic obstructive pulmonary disease) ([place])   . Coronary artery disease   . Dialysis patient ([place])    Mon, Wed, Fri  . End stage renal disease ([place])   . GERD (gastroesophageal reflux disease)   . Headache   . History of kidney stones   . History of pulmonary embolism   . HLD (hyperlipidemia)   . HTN (hypertension)   . Hyperparathyroidism   . Myocardial infarction ([place])   . Peripheral  vascular disease ([place])   . Shortness of breath dyspnea   . Sleep apnea    NO C-PAP, Patient stated in process of  "getting one"   . Tobacco dependence     Medications:  Scheduled:  . aspirin EC  81 mg Oral Daily  . atorvastatin  80 mg Oral q1800  . carvedilol  6.25 mg Oral BID  . Chlorhexidine Gluconate Cloth  6 each Topical Q0600  . Chlorhexidine Gluconate Cloth  6 each Topical Q0600  . erythromycin  1 application Right Eye QHS  . fluticasone  1 spray Each Nare Daily  . isosorbide mononitrate  30 mg Oral Daily  . loratadine  10 mg Oral Daily  . mouth rinse  15 mL Mouth Rinse BID  .  midodrine  10 mg Oral Q8H  . multivitamin  1 tablet Oral QHS  . mupirocin ointment  1 application Nasal BID  . pregabalin  75 mg Oral Daily  . tiotropium  18 mcg Inhalation Daily  . vitamin C  500 mg Oral BID    Assessment: Patient admitted for CP from nursing facility, trops 1.89. Patient takes eliquis 5 mg bid PTA (unclear why) and unsure when last dose was.  Patient is being started on heparin drip for UA/ACS Baseline labs show HL >3.6, hgb 7.6, PT/INR 15.9/1.3  3/17 Patient remains on pantoprazole infusion. Patient had cardiac catheterization (3/17) showing occluded proximal right coronary artery left circumflex artery and significant stenosis of distal left main and LAD at 85%. Patient's heparin resumed at 1630 @ a rate of 850 units/hr. No bolus.  3/17 @ 2019 aPTT 60- Infusion increased to 900 units/hr  Goal of Therapy:  APTT 66 - 102 seconds Heparin level 0.3-0.7 units/ml Monitor platelets by anticoagulation protocol: Yes   Plan:  3/18 @ 0600 aPTT 74. APTT therapeutic x 1. Will continue heparin infusion @ 900 units/hr.  Will order confirmatory  level in 8 hours.   Will continue manage heparin via aPTTs until anti-Xa levels correlate with aPTTs.   Pharmacy will continue to monitor and adjust per consult.    Pernell Dupre, PharmD, BCPS Clinical Pharmacist 08/12/2018 6:51  AM

## 2018-08-12 NOTE — Progress Notes (Signed)
Post HD Assessment    08/12/18 1310  Neurological  Level of Consciousness Alert  Orientation Level Oriented X4  Respiratory  Respiratory Pattern Regular  Chest Assessment Chest expansion symmetrical  Bilateral Breath Sounds Clear;Diminished  Cough None  Cardiac  Pulse Regular  Heart Sounds S1, S2  Cardiac Rhythm NSR;BBB  Vascular  R Radial Pulse +2  L Radial Pulse +2  Edema Generalized  Generalized Edema +1  Psychosocial  Psychosocial (WDL) WDL

## 2018-08-12 NOTE — Progress Notes (Signed)
[facility] NAME: [name]    MR#:  [id number]  DATE OF BIRTH:  1956-01-27  SUBJECTIVE:  CHIEF COMPLAINT:   Chief Complaint  Patient presents with  . Chest Pain   Came with chest pain.  Noted to have low hemoglobin and guaiac positive stool.  Also had elevated troponin so admitted to ICU with borderline blood pressure on heparin drip and  blood transfusion. No complains,.  He was noted to have significant coronary artery disease on cardiac cath.  No more GI bleed.  Hemoglobin is stable.  Blood pressure is stable.  REVIEW OF SYSTEMS:  CONSTITUTIONAL: No fever, fatigue or weakness.  EYES: No blurred or double vision.  EARS, NOSE, AND THROAT: No tinnitus or ear pain.  RESPIRATORY: No cough, shortness of breath, wheezing or hemoptysis.  CARDIOVASCULAR: Have chest pain, no orthopnea, edema.  GASTROINTESTINAL: No nausea, vomiting, diarrhea or abdominal pain.  GENITOURINARY: No dysuria, hematuria.  ENDOCRINE: No polyuria, nocturia,  HEMATOLOGY: No anemia, easy bruising or bleeding SKIN: No rash or lesion. MUSCULOSKELETAL: No joint pain or arthritis.   NEUROLOGIC: No tingling, numbness, weakness.  PSYCHIATRY: No anxiety or depression.   ROS  DRUG ALLERGIES:   Allergies  Allergen Reactions  . Dust Mite Extract Other (See Comments)    Reaction: unknown    VITALS:  Blood pressure 122/69, pulse 77, temperature 98.1 F (36.7 C), temperature source Oral, resp. rate 11, height 5\' 8"  (1.727 m), weight 95.7 kg, SpO2 97 %.  PHYSICAL EXAMINATION:  GENERAL:  63 y.o.-year-old patient lying in the bed with no acute distress.  EYES: Pupils equal, round, reactive to light and accommodation. No scleral icterus. Extraocular muscles intact.  HEENT: Head atraumatic, normocephalic. Oropharynx and nasopharynx clear.  NECK:  Supple, no jugular venous distention. No thyroid enlargement, no tenderness.  LUNGS: Normal breath sounds bilaterally, no  wheezing, rales,rhonchi or crepitation. No use of accessory muscles of respiration.  CARDIOVASCULAR: S1, S2 normal. No murmurs, rubs, or gallops.  ABDOMEN: Soft, nontender, nondistended. Bowel sounds present. No organomegaly or mass.  EXTREMITIES: No pedal edema, cyanosis, or clubbing.  NEUROLOGIC: Cranial nerves II through XII are intact. Muscle strength 4/5 in all extremities. Sensation intact. Gait not checked.  PSYCHIATRIC: The patient is alert and oriented x 3.  SKIN: No obvious rash, lesion, or ulcer.   Physical Exam LABORATORY PANEL:   CBC Recent Labs  Lab 08/12/18 0613  WBC 8.9  HGB 9.6*  HCT 31.4*  PLT 189   ------------------------------------------------------------------------------------------------------------------  Chemistries  Recent Labs  Lab 08/10/18 0243  08/12/18 0613  NA 138   < > 139  K 4.1   < > 5.1  CL 103   < > 100  CO2 21*   < > 27  GLUCOSE 131*   < > 110*  BUN 67*   < > 57*  CREATININE 10.25*   < > 8.81*  CALCIUM 9.1   < > 9.1  AST 35  --   --   ALT 29  --   --   ALKPHOS 82  --   --   BILITOT 0.3  --   --    < > = values in this interval not displayed.   ------------------------------------------------------------------------------------------------------------------  Cardiac Enzymes Recent Labs  Lab 08/10/18 0243 08/11/18 0624  TROPONINI 1.89* 7.34*   ------------------------------------------------------------------------------------------------------------------  RADIOLOGY:  No results found.  ASSESSMENT AND PLAN:   Active Problems:   Non-STEMI (non-ST elevated myocardial infarction) ([place])  Acute GI bleeding  #1.  None-ST elevation MI. on IV heparin per Dr. Alveria Apley recommendation while transfusing the patient 2 units of packed red blood cells for his anemia.  follow serial cardiac enzymes and EKGs.   on IV nitroglycerin drip as well as PRN IV morphine sulfate.    holding off Eliquis in addition to aspirin given his  GI bleeding.    2D echo will be obtained. Start betablocker and statin. Significant blockages in LAD and left main coronary and occluded proximal right coronary and proximal left circumflex.  Cardiology has suggested to do nuclear medicine stress study to have functional assessment to decide about further work-up.  2.  GI bleeding with acute blood loss anemia.  This is likely acute on top of chronic anemia of end-stage renal disease.  strongly positive stool Hemoccult.    follow hemoglobin hematocrit levels.    transfused 2 units of packed red blood cells.  Dr. Nehemiah Massed is aware about his GI bleeding and given his acute non-STEMI recommended IV heparin while transfusing the patient.    aspirin and Eliquis will be held off. Appreciated GI consult, as now patient's GI bleed seems to be stopped and hemoglobin stable, GI suggesting due to high risk cardiology status no need for procedures for now unless patient start bleeding again.  3.  End-stage renal disease on hemodialysis likely with associated fluid overload could be related to acute on top of chronic systolic CHF.  Nephrology consultation   PhosLo and Renvela will be continued.  Cont HD per nephro inpatient.  4.  COPD.  The Spiriva will be continued and he will be placed on continued care and Combivent.  5.  Dyslipidemia.  Lipitor dose will be increased .  6.  Hypertension.  His Coreg will be resumed.  We will monitor his blood pressure given usual hypotension.  7.  DVT prophylaxis.  This is covered with IV heparin and GI prophylaxis was addressed above pain of the     All the records are reviewed and case discussed with Care Management/Social Workerr. Management plans discussed with the patient, family and they are in agreement.  CODE STATUS: Full  TOTAL TIME TAKING CARE OF THIS PATIENT: 35 minutes.     POSSIBLE D/C IN2-3 DAYS, DEPENDING ON CLINICAL CONDITION.   Vaughan Basta M.D on 08/12/2018   Between 7am  to 6pm - Pager - [phone]  After 6pm go to [email or web address] - password [email or web address] [facility]  Office  [phone]  CC: Primary care physician; Donnie Coffin, MD  Note: This dictation was prepared with Dragon dictation along with smaller phrase technology. Any transcriptional errors that result from this process are unintentional.

## 2018-08-13 ENCOUNTER — Encounter: Payer: Self-pay | Admitting: Radiology

## 2018-08-13 ENCOUNTER — Encounter: Payer: Self-pay | Admitting: Pulmonary Disease

## 2018-08-13 ENCOUNTER — Inpatient Hospital Stay: Payer: Medicare Other

## 2018-08-13 LAB — NM MYOCAR MULTI W/SPECT W/WALL MOTION / EF
Estimated workload: 1 METS
Exercise duration (min): 1 min
Exercise duration (sec): 0 s
LV dias vol: 319 mL (ref 62–150)
LV sys vol: 255 mL
MPHR: 156 {beats}/min
Peak HR: 96 {beats}/min
Percent HR: 61 %
Rest HR: 79 {beats}/min
SDS: 7
SRS: 7
SSS: 9
TID: 1.79

## 2018-08-13 LAB — CBC
HCT: 29.8 % — ABNORMAL LOW (ref 39.0–52.0)
Hemoglobin: 9.3 g/dL — ABNORMAL LOW (ref 13.0–17.0)
MCH: 30.5 pg (ref 26.0–34.0)
MCHC: 31.2 g/dL (ref 30.0–36.0)
MCV: 97.7 fL (ref 80.0–100.0)
PLATELETS: 185 10*3/uL (ref 150–400)
RBC: 3.05 MIL/uL — ABNORMAL LOW (ref 4.22–5.81)
RDW: 16.2 % — ABNORMAL HIGH (ref 11.5–15.5)
WBC: 8.2 10*3/uL (ref 4.0–10.5)
nRBC: 0 % (ref 0.0–0.2)

## 2018-08-13 LAB — APTT
aPTT: 60 seconds — ABNORMAL HIGH (ref 24–36)
aPTT: 68 seconds — ABNORMAL HIGH (ref 24–36)

## 2018-08-13 LAB — HEPARIN LEVEL (UNFRACTIONATED): Heparin Unfractionated: 1.27 IU/mL — ABNORMAL HIGH (ref 0.30–0.70)

## 2018-08-13 LAB — TROPONIN I
Troponin I: 7.08 ng/mL (ref <0.03)
Troponin I: 6.33 ng/mL (ref <0.03)

## 2018-08-13 LAB — PARATHYROID HORMONE, INTACT (NO CA): PTH: 246 pg/mL — ABNORMAL HIGH (ref 15–65)

## 2018-08-13 MED ORDER — BISACODYL 10 MG RE SUPP
10.0000 mg | Freq: Once | RECTAL | Status: DC
  Filled 2018-08-13: qty 1

## 2018-08-13 MED ORDER — PANTOPRAZOLE SODIUM 40 MG IV SOLR
40.0000 mg | Freq: Two times a day (BID) | INTRAVENOUS | Status: DC
  Administered 2018-08-13 – 2018-08-18 (×11): 40 mg via INTRAVENOUS
  Filled 2018-08-13 (×11): qty 40

## 2018-08-13 MED ORDER — SENNOSIDES-DOCUSATE SODIUM 8.6-50 MG PO TABS
2.0000 | ORAL_TABLET | Freq: Two times a day (BID) | ORAL | Status: DC
  Administered 2018-08-13 – 2018-08-18 (×10): 2 via ORAL
  Filled 2018-08-13 (×11): qty 2

## 2018-08-13 MED ORDER — REGADENOSON 0.4 MG/5ML IV SOLN
0.4000 mg | Freq: Once | INTRAVENOUS | Status: AC
  Administered 2018-08-13: 0.4 mg via INTRAVENOUS

## 2018-08-13 MED ORDER — TECHNETIUM TC 99M TETROFOSMIN IV KIT
10.7700 | PACK | Freq: Once | INTRAVENOUS | Status: AC | PRN
  Administered 2018-08-13: 10.77 via INTRAVENOUS

## 2018-08-13 MED ORDER — TECHNETIUM TC 99M TETROFOSMIN IV KIT
32.1650 | PACK | Freq: Once | INTRAVENOUS | Status: AC | PRN
  Administered 2018-08-13: 32.165 via INTRAVENOUS

## 2018-08-13 NOTE — Progress Notes (Signed)
CRITICAL CARE NOTE        SUBJECTIVE FINDINGS & SIGNIFICANT EVENTS    Complains of chest pain-requiring morphine, S/p cardio procedure-lexiscan. For HD in AM tommorow. No additional rectal bleeding. Addomen distednded no BM in 72h will add suppository.  Possibly needing transfer for cardiac intervention pending cards recs today.   PAST MEDICAL HISTORY   Past Medical History:  Diagnosis Date  . 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 Amputee, below knee, left ([place])   . Anemia   . Asthma   . Cardiomyopathy ([place])   . CHF (congestive heart failure) ([place])   . Chronic systolic heart failure ([place])   . Complication of anesthesia    hypotension  . COPD (chronic obstructive pulmonary disease) ([place])   . Coronary artery disease   . Dialysis patient ([place])    Mon, Wed, Fri  . End stage renal disease ([place])   . GERD (gastroesophageal reflux disease)   . Headache   . History of kidney stones   . History of pulmonary embolism   . HLD (hyperlipidemia)   . HTN (hypertension)   . Hyperparathyroidism   . Myocardial infarction ([place])   . Peripheral vascular disease ([place])   . Shortness of breath dyspnea   . Sleep apnea    NO C-PAP, Patient stated in process of  "getting one"   . Tobacco dependence      SURGICAL HISTORY   Past Surgical History:  Procedure Laterality Date  . A/V FISTULAGRAM Left 08/14/2017   Procedure: A/V FISTULAGRAM;  Surgeon: Algernon Huxley, MD;  Location: [facility];  Service: Cardiovascular;  Laterality: Left;  . AMPUTATION Left 05/06/2014   Procedure: AMPUTATION BELOW KNEE;  Surgeon: Elam Dutch, MD;  Location: [facility];  Service: Vascular;  Laterality: Left;  . AMPUTATION Right 01/12/2015   Procedure: Foot transmetatarsal amputation;  Surgeon: Algernon Huxley, MD;  Location:  [facility];  Service: Vascular;  Laterality: Right;  . APPLICATION OF WOUND VAC Right 03/01/2015   Procedure: Application of Bio-connekt graft and wound vac application to right foot ;  Surgeon: Algernon Huxley, MD;  Location: [facility];  Service: Vascular;  Laterality: Right;  . AV FISTULA PLACEMENT Left   . AV FISTULA PLACEMENT Left 11/28/2016   Procedure: ARTERIOVENOUS (AV) FISTULA CREATION;  Surgeon: Algernon Huxley, MD;  Location: [facility];  Service: Vascular;  Laterality: Left;  . CARDIAC CATHETERIZATION     stent placement   . CORONARY ANGIOPLASTY    . DIALYSIS/PERMA CATHETER INSERTION N/A 07/31/2016   Procedure: Dialysis/Perma Catheter Insertion;  Surgeon: Algernon Huxley, MD;  Location: [facility];  Service: Cardiovascular;  Laterality: N/A;  . DIALYSIS/PERMA CATHETER INSERTION N/A 07/25/2017   Procedure: DIALYSIS/PERMA CATHETER INSERTION and fistulagram;  Surgeon: Algernon Huxley, MD;  Location: [place] [facility];  Service: Cardiovascular;  Laterality: N/A;  . DIALYSIS/PERMA CATHETER REMOVAL N/A 07/22/2017   Procedure: DIALYSIS/PERMA CATHETER REMOVAL;  Surgeon: Katha Cabal, MD;  Location: [facility];  Service: Cardiovascular;  Laterality: N/A;  . DIALYSIS/PERMA CATHETER REMOVAL N/A 02/05/2018   Procedure: DIALYSIS/PERMA CATHETER REMOVAL;  Surgeon: Algernon Huxley, MD;  Location: [facility];  Service: Cardiovascular;  Laterality: N/A;  . IR FLUORO GUIDE CV LINE LEFT  04/10/2017  . LEFT HEART CATH AND CORONARY ANGIOGRAPHY N/A 08/11/2018   Procedure: LEFT HEART CATH AND CORONARY ANGIOGRAPHY;  Surgeon: Corey Skains, MD;  [facility];  Service: Cardiovascular;  Laterality: N/A;  . LIGATION OF ARTERIOVENOUS  FISTULA Right 01/31/2016   Procedure: LIGATION OF ARTERIOVENOUS  FISTULA;  Surgeon: Algernon Huxley, MD;  Location: [facility];  Service: Vascular;  Laterality: Right;  . PERIPHERAL VASCULAR CATHETERIZATION Right 12/15/2014   Procedure: Lower Extremity  Angiography;  Surgeon: Algernon Huxley, MD;  Location: [facility];  Service: Cardiovascular;  Laterality: Right;  . PERIPHERAL VASCULAR CATHETERIZATION  12/15/2014   Procedure: Lower Extremity Intervention;  Surgeon: Algernon Huxley, MD;  Location: [place] [facility];  Service: Cardiovascular;;  . PERIPHERAL VASCULAR CATHETERIZATION Right 08/14/2015   Procedure: A/V Shuntogram/Fistulagram;  Surgeon: Algernon Huxley, MD;  Location: [facility];  Service: Cardiovascular;  Laterality: Right;  . PERIPHERAL VASCULAR CATHETERIZATION N/A 08/14/2015   Procedure: A/V Shunt Intervention;  Surgeon: Algernon Huxley, MD;  Location: [place] [facility];  Service: Cardiovascular;  Laterality: N/A;  . PERIPHERAL VASCULAR CATHETERIZATION N/A 01/11/2016   Procedure: Dialysis/Perma Catheter Insertion;  Surgeon: Algernon Huxley, MD;  Location: [facility];  Service: Cardiovascular;  Laterality: N/A;  . REVISON OF ARTERIOVENOUS FISTULA Right 02/17/2016   Procedure: removal of AV fistula;  Surgeon: Serafina Mitchell, MD;  Location: [facility];  Service: Vascular;  Laterality: Right;  . REVISON OF ARTERIOVENOUS FISTULA Right 01/31/2016   Procedure: REVISON OF ARTERIOVENOUS FISTULA ( BRACHIOCEPHALIC ) W/ ARTEGRAFT;  Surgeon: Algernon Huxley, MD;  Location: [facility];  Service: Vascular;  Laterality: Right;  . TRANSMETATARSAL AMPUTATION Right 05/04/2015   Procedure: TRANSMETATARSAL AMPUTATION REVISION, great toe amputation;  Surgeon: Algernon Huxley, MD;  Location: [facility];  Service: Vascular;  Laterality: Right;     FAMILY HISTORY   Family History  Problem Relation Age of Onset  . Leukemia Mother   . Heart attack Father   . Heart failure Other   . Hypertension Other   . Leukemia Other   . Diabetes Other   . Prostate cancer Neg Hx   . Kidney cancer Neg Hx   . Bladder Cancer Neg Hx      SOCIAL HISTORY   Social History   Tobacco Use  . Smoking status: Light Tobacco Smoker    Packs/day: 0.25    Years: 30.00     Pack years: 7.50    Types: Cigarettes  . Smokeless tobacco: Never Used  . Tobacco comment: 5  Substance Use Topics  . Alcohol use: No    Alcohol/week: 0.0 standard drinks  . Drug use: No    Comment: has used crack cocaine in past      MEDICATIONS   Current Medication:  Current Facility-Administered Medications:  .  acetaminophen (TYLENOL) tablet 650 mg, 650 mg, Oral, Q4H PRN, Mansy, Jan A, MD .  albuterol (PROVENTIL) (2.5 MG/3ML) 0.083% nebulizer solution 2.5 mg, 2.5 mg, Inhalation, Q6H PRN, Mansy, Jan A, MD .  aspirin EC tablet 81 mg, 81 mg, Oral, Daily, Mansy, Jan A, MD, 81 mg at 08/12/18 0942 .  atorvastatin (LIPITOR) tablet 80 mg, 80 mg, Oral, q1800, Mansy, Jan A, MD, 80 mg at 08/12/18 1826 .  carvedilol (COREG) tablet 6.25 mg, 6.25 mg, Oral, BID, Mansy, Jan A, MD, 6.25 mg at 08/12/18 2110 .  Chlorhexidine Gluconate Cloth 2 % PADS 6 each, 6 each, Topical, Q0600, Murlean Iba, MD, 6 each at 08/12/18 0554 .  Chlorhexidine Gluconate Cloth 2 % PADS 6 each, 6 each, Topical, Q0600, Ottie Glazier, MD, 6 each at 08/12/18 0554 .  docusate sodium (COLACE) capsule 100 mg, 100 mg, Oral, Daily PRN, Mansy, Jan A, MD .  epoetin alfa (EPOGEN,PROCRIT) injection 4,000 Units, 4,000 Units, Intravenous, Q M,W,F-HD, Holley Raring, Munsoor, MD, 4,000 Units at 08/12/18 1200 .  erythromycin ophthalmic ointment 1 application, 1 application, Right Eye, QHS, Mansy, Jan A, MD .  feeding supplement (NEPRO CARB STEADY) liquid 237 mL, 237 mL, Oral, BID BM, Lateef, Munsoor, MD, 237 mL at 08/12/18 1500 .  fluticasone (FLONASE) 50 MCG/ACT nasal spray 1 spray, 1 spray, Each Nare, Daily, Mansy, Jan A, MD, 1 spray at 08/11/18 1000 .  guaiFENesin (ROBITUSSIN) 100 MG/5ML solution 200 mg, 200 mg, Oral, TID PRN, Mansy, Jan A, MD .  heparin ADULT infusion 100 units/mL (25000 units/223mL sodium chloride 0.45%), 950 Units/hr, Intravenous, Continuous, Charlett Nose, RPH, Last Rate: 9.5 mL/hr at 08/13/18 0300, 950 Units/hr at  08/13/18 0300 .  ipratropium-albuterol (DUONEB) 0.5-2.5 (3) MG/3ML nebulizer solution 3 mL, 3 mL, Inhalation, Q6H PRN, Mansy, Jan A, MD .  isosorbide mononitrate (IMDUR) 24 hr tablet 30 mg, 30 mg, Oral, Daily, Mansy, Jan A, MD, 30 mg at 08/10/18 1014 .  loratadine (CLARITIN) tablet 10 mg, 10 mg, Oral, Daily, Mansy, Jan A, MD, 10 mg at 08/12/18 1012 .  MEDLINE mouth rinse, 15 mL, Mouth Rinse, BID, Khyrin Trevathan, MD, 15 mL at 08/12/18 2113 .  midodrine (PROAMATINE) tablet 10 mg, 10 mg, Oral, Q8H, Mansy, Jan A, MD, 10 mg at 08/13/18 0537 .  morphine 2 MG/ML injection 2 mg, 2 mg, Intravenous, Q4H PRN, Darel Hong D, NP .  multivitamin (RENA-VIT) tablet 1 tablet, 1 tablet, Oral, QHS, Charlett Nose, RPH, 1 tablet at 08/12/18 2110 .  mupirocin ointment (BACTROBAN) 2 % 1 application, 1 application, Nasal, BID, Genelda Roark, MD, 1 application at 27/25/36 1012 .  nitroGLYCERIN (NITROSTAT) SL tablet 0.4 mg, 0.4 mg, Sublingual, Q5 min PRN, Darel Hong D, NP, 0.4 mg at 08/11/18 0604 .  ondansetron (ZOFRAN) injection 4 mg, 4 mg, Intravenous, Q6H PRN, Mansy, Jan A, MD .  polyethylene glycol (MIRALAX / GLYCOLAX) packet 17 g, 17 g, Oral, Daily PRN, Mansy, Jan A, MD .  pregabalin (LYRICA) capsule 75 mg, 75 mg, Oral, Daily, Mansy, Jan A, MD, 75 mg at 08/12/18 1534 .  tiotropium (SPIRIVA) inhalation capsule ([facility] use ONLY) 18 mcg, 18 mcg, Inhalation, Daily, Mansy, Jan A, MD, 18 mcg at 08/12/18 1012 .  traMADol (ULTRAM) tablet 75 mg, 75 mg, Oral, Q8H PRN, Mansy, Jan A, MD, 75 mg at 08/11/18 0530 .  vitamin C (ASCORBIC ACID) tablet 500 mg, 500 mg, Oral, BID, Charlett Nose, RPH, 500 mg at 08/12/18 2110 .  zolpidem (AMBIEN) tablet 5 mg, 5 mg, Oral, QHS PRN,MR X 1, Mansy, Jan A, MD, 5 mg at 08/11/18 2257    ALLERGIES   Dust mite extract    REVIEW OF SYSTEMS    10 system ROS + for abd discomfort.   PHYSICAL EXAMINATION   Vitals:   08/13/18 0600 08/13/18 0700  BP: 123/84 132/81   Pulse: 71 73  Resp: 13 12  Temp:    SpO2: 100% 100%    GENERAL:NAD HEAD: Normocephalic, atraumatic.  EYES: Pupils equal, round, reactive to light.  No scleral icterus.  MOUTH: Moist mucosal membrane. NECK: Supple. No thyromegaly. No nodules. No JVD.  PULMONARY: CTAB CARDIOVASCULAR: S1 and S2. Regular rate and rhythm. No murmurs, rubs, or gallops.  GASTROINTESTINAL: Soft, nontender, non-distended. No masses. Positive bowel sounds. No hepatosplenomegaly.  MUSCULOSKELETAL: No swelling,  clubbing, or edema.  NEUROLOGIC: Mild distress due to acute illness SKIN:intact,warm,dry   LABS AND IMAGING     -I personally reviewed most recent blood work, imaging and microbiology - significant findings today are CKD, anemia  LAB RESULTS: Recent Labs  Lab 08/11/18 0453 08/12/18 0613 08/12/18 2249  NA 140 139 138  K 4.5 5.1 4.0  CL 103 100 99  CO2 27 27 30   BUN 39* 57* 36*  CREATININE 7.30* 8.81* 6.74*  GLUCOSE 88 110* 115*   Recent Labs  Lab 08/11/18 0453 08/12/18 0613 08/13/18 0412  HGB 9.3* 9.6* 9.3*  HCT 29.6* 31.4* 29.8*  WBC 10.7* 8.9 8.2  PLT 205 189 185     IMAGING RESULTS: No results found.    ASSESSMENT AND PLAN   Multidisciplinary rounds held today   Acute blood loss anemia -resolved  - Hematochezia - likely related to oral anticoagulant agent - H/H trending -PRBC transfusion with goal Hb>8 due to active CAD - GI evaluation-poss EGD/Cscope post cardio clearance -abd distension with constipation -s/p bowel regimen    AcuteHypoxic Respiratory Failure -continue supportive measures - [place] O2 goal O2 saturation 90%-noncompliant with cpap - currenlty on 4Lnc  -continue Bronchodilator Therapy    CARDIAC FAILURE- ACS -NSTEMI - Cardiology on case - appreciate input-status post left heart cath- lexiscan today-complains of severe chest pain-improved with 2mg  morphine -follow up cardiac enzymes as indicated ICU monitoring   Renal  Failure-CKD4 -Nephrology on case - appreciate input -for HD in am    ID -continue IV abx as prescibed-nasal MRSA + -follow up cultures  GI/Nutrition GI PROPHYLAXIS as indicated DIET-->TF's as tolerated Constipation protocol as indicated  ENDO - ICU hypoglycemic\Hyperglycemia protocol -check FSBS per protocol   ELECTROLYTES -follow labs as needed -replace as needed -pharmacy consultation   DVT/GI PRX ordered -SCDs  TRANSFUSIONS AS NEEDED MONITOR FSBS ASSESS the need for LABS as needed   Critical care provider statement:   Critical care time (minutes): 36  Critical care time was exclusive of: Separately billable procedures and treating other patients  Critical care was necessary to treat or prevent imminent or life-threatening deterioration of the following conditions: ACS, LGIB, hypoxemia, DM comorbid   Critical care was time spent personally by me on the following activities: Development of treatment plan with patient or surrogate, discussions with consultants, evaluation of patient's response to treatment, examination of patient, obtaining history from patient or surrogate, ordering and performing treatments and interventions, ordering and review of laboratory studies and re-evaluation of patient's condition.  I assumed direction of critical care for this patient from another provider in my specialty: no   This document was prepared using Dragon voice recognition software and may include unintentional dictation errors.   Ottie Glazier, M.D.  Division of [place]

## 2018-08-13 NOTE — TOC Progression Note (Signed)
Transition of Care [facility]) - Progression Note    Patient Details  Name: KYRELL RUACHO MRN: [id number] Date of Birth: 1955/10/12  Transition of Care [facility]) CM/SW Contact  Shelbie Hutching, RN Phone Number: 08/13/2018, 1:34 PM  Clinical Narrative:    RNCM attempted to speak with patient today regarding his discharge plan.  Patient states he does not want to talk and he would not talk.  Patient will go back to [facility] assisted living at discharge with [facility].   Expected Discharge Plan: Assisted Living(with home health care)    Expected Discharge Plan and Services Expected Discharge Plan: Assisted Living(with home health care)       Living arrangements for the past 2 months: [place]                           Social Determinants of Health (SDOH) Interventions    Readmission Risk Interventions Readmission Risk Prevention Plan 04/24/2018  Transportation Screening Complete  Medication Review Press photographer) Complete  PCP or Specialist appointment within 3-5 days of discharge Complete  HRI or Home Care Consult Complete  SW Recovery Care/Counseling Consult Complete  Palliative Care Screening Not Complete  Medication Reconcilation (Pharmacy) Complete  [place] Not Complete  Some recent data might be hidden

## 2018-08-13 NOTE — Progress Notes (Signed)
Patient declined CPAP at this time. Will continue to monitor.

## 2018-08-13 NOTE — Progress Notes (Signed)
CRITICAL VALUE ALERT  Critical Value:  Troponin 6.33  Date & Time Notied:  08/13/2018 at 2015  Provider Notified: Marda Stalker, NP  Orders Received/Actions taken: Trending down (last troponin 7.08). Patient is asymptomatic. Will continue to monitor.  Cameron Ali, RN

## 2018-08-13 NOTE — Progress Notes (Signed)
[place] for heparin Indication: chest pain/ACS  Allergies  Allergen Reactions  . Dust Mite Extract Other (See Comments)    Reaction: unknown    Patient Measurements: Height: 5\' 8"  (172.7 cm) Weight: 212 lb 8.4 oz (96.4 kg) IBW/kg (Calculated) : 68.4 Heparin Dosing Weight: 90.4 kg  Vital Signs: Temp: 98.5 F (36.9 C) (03/19 1200) Temp Source: Oral (03/19 1200) BP: 121/78 (03/19 1400) Pulse Rate: 76 (03/19 1400)  Labs: Recent Labs    08/11/18 0453 08/11/18 0624  08/12/18 0613 08/12/18 1417 08/12/18 2249 08/13/18 0412 08/13/18 1213  HGB 9.3*  --   --  9.6*  --   --  9.3*  --   HCT 29.6*  --   --  31.4*  --   --  29.8*  --   PLT 205  --   --  189  --   --  185  --   APTT 114*  --    < > 74* 63* 69*  --  60*  LABPROT 14.9  --   --   --   --   --   --   --   INR 1.2  --   --   --   --   --   --   --   HEPARINUNFRC 3.16*  --   --  2.20*  --   --  1.27*  --   CREATININE 7.30*  --   --  8.81*  --  6.74*  --   --   TROPONINI  --  7.34*  --   --   --   --   --  7.08*   < > = values in this interval not displayed.    Estimated Creatinine Clearance: 12.5 mL/min (A) (by C-G formula based on SCr of 6.74 mg/dL (H)).   Medical History: Past Medical History:  Diagnosis Date  . 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 Amputee, below knee, left ([place])   . Anemia   . Asthma   . Cardiomyopathy ([place])   . CHF (congestive heart failure) ([place])   . Chronic systolic heart failure ([place])   . Complication of anesthesia    hypotension  . COPD (chronic obstructive pulmonary disease) ([place])   . Coronary artery disease   . Dialysis patient ([place])    Mon, Wed, Fri  . End stage renal disease ([place])   . GERD (gastroesophageal reflux disease)   . Headache   . History of kidney stones   . History of pulmonary embolism   . HLD (hyperlipidemia)   . HTN (hypertension)   . Hyperparathyroidism   . Myocardial infarction ([place])   . Peripheral  vascular disease ([place])   . Shortness of breath dyspnea   . Sleep apnea    NO C-PAP, Patient stated in process of  "getting one"   . Tobacco dependence     Medications:  Scheduled:  . aspirin EC  81 mg Oral Daily  . atorvastatin  80 mg Oral q1800  . bisacodyl  10 mg Rectal Once  . carvedilol  6.25 mg Oral BID  . Chlorhexidine Gluconate Cloth  6 each Topical Q0600  . Chlorhexidine Gluconate Cloth  6 each Topical Q0600  . epoetin (EPOGEN/PROCRIT) injection  4,000 Units Intravenous Q M,W,F-HD  . erythromycin  1 application Right Eye QHS  . feeding supplement (NEPRO CARB STEADY)  237 mL Oral BID BM  . fluticasone  1 spray  Each Nare Daily  . isosorbide mononitrate  30 mg Oral Daily  . loratadine  10 mg Oral Daily  . mouth rinse  15 mL Mouth Rinse BID  . midodrine  10 mg Oral Q8H  . multivitamin  1 tablet Oral QHS  . mupirocin ointment  1 application Nasal BID  . pantoprazole (PROTONIX) IV  40 mg Intravenous Q12H  . pregabalin  75 mg Oral Daily  . senna-docusate  2 tablet Oral BID  . tiotropium  18 mcg Inhalation Daily  . vitamin C  500 mg Oral BID    Assessment: Patient admitted for CP from nursing facility, trops 1.89. Patient takes eliquis 5 mg bid PTA (unclear why) and unsure when last dose was.  Patient is being started on heparin drip for UA/ACS Baseline labs show HL >3.6, hgb 7.6, PT/INR 15.9/1.3  Patient remains on pantoprazole infusion. Patient had cardiac catheterization (3/17) showing occluded proximal right coronary artery left circumflex artery and significant stenosis of distal left main and LAD at 85%.   Heparin currently infusing at 950 units/hr.   Goal of Therapy:  APTT 66 - 102 seconds Heparin level 0.3-0.7 units/ml Monitor platelets by anticoagulation protocol: Yes   Plan:  Patient's heparin drip was paused during stress test. Will continue current heparin rate of 950 units/hr. Will recheck aPTT at 1800.   Will continue manage heparin via aPTTs until  anti-Xa levels correlate with aPTTs. Will obtain next anti-Xa level with am labs.   Pharmacy will continue to monitor and adjust per consult.   Simpson,Michael L 08/13/2018 5:24 PM

## 2018-08-13 NOTE — Progress Notes (Signed)
Patient has refused to allow this RN to complete his 0200 assessment at this time, stating "Leave me alone."  All vital signs are stable and within normal limits.  Will continue to monitor.  Cameron Ali, RN

## 2018-08-13 NOTE — Progress Notes (Signed)
[place] for heparin Indication: chest pain/ACS  Allergies  Allergen Reactions  . Dust Mite Extract Other (See Comments)    Reaction: unknown    Patient Measurements: Height: 5\' 8"  (172.7 cm) Weight: 212 lb 8.4 oz (96.4 kg) IBW/kg (Calculated) : 68.4 Heparin Dosing Weight: 90.4 kg  Vital Signs: Temp: 98.2 F (36.8 C) (03/19 2000) Temp Source: Oral (03/19 2000) BP: 114/81 (03/19 2000) Pulse Rate: 77 (03/19 2000)  Labs: Recent Labs    08/11/18 0453 08/11/18 4196  08/12/18 0613  08/12/18 2249 08/13/18 0412 08/13/18 1213 08/13/18 1751 08/13/18 1855  HGB 9.3*  --   --  9.6*  --   --  9.3*  --   --   --   HCT 29.6*  --   --  31.4*  --   --  29.8*  --   --   --   PLT 205  --   --  189  --   --  185  --   --   --   APTT 114*  --    < > 74*   < > 69*  --  60* 68*  --   LABPROT 14.9  --   --   --   --   --   --   --   --   --   INR 1.2  --   --   --   --   --   --   --   --   --   HEPARINUNFRC 3.16*  --   --  2.20*  --   --  1.27*  --   --   --   CREATININE 7.30*  --   --  8.81*  --  6.74*  --   --   --   --   TROPONINI  --  7.34*  --   --   --   --   --  7.08*  --  6.33*   < > = values in this interval not displayed.    Estimated Creatinine Clearance: 12.5 mL/min (A) (by C-G formula based on SCr of 6.74 mg/dL (H)).   Medical History: Past Medical History:  Diagnosis Date  . 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 Amputee, below knee, left ([place])   . Anemia   . Asthma   . Cardiomyopathy ([place])   . CHF (congestive heart failure) ([place])   . Chronic systolic heart failure ([place])   . Complication of anesthesia    hypotension  . COPD (chronic obstructive pulmonary disease) ([place])   . Coronary artery disease   . Dialysis patient ([place])    Mon, Wed, Fri  . End stage renal disease ([place])   . GERD (gastroesophageal reflux disease)   . Headache   . History of kidney stones   . History of pulmonary embolism   . HLD  (hyperlipidemia)   . HTN (hypertension)   . Hyperparathyroidism   . Myocardial infarction ([place])   . Peripheral vascular disease ([place])   . Shortness of breath dyspnea   . Sleep apnea    NO C-PAP, Patient stated in process of  "getting one"   . Tobacco dependence     Medications:  Scheduled:  . aspirin EC  81 mg Oral Daily  . atorvastatin  80 mg Oral q1800  . bisacodyl  10 mg Rectal Once  . carvedilol  6.25 mg Oral BID  . Chlorhexidine  Gluconate Cloth  6 each Topical [id number]  . Chlorhexidine Gluconate Cloth  6 each Topical Q0600  . epoetin (EPOGEN/PROCRIT) injection  4,000 Units Intravenous Q M,W,F-HD  . erythromycin  1 application Right Eye QHS  . feeding supplement (NEPRO CARB STEADY)  237 mL Oral BID BM  . fluticasone  1 spray Each Nare Daily  . isosorbide mononitrate  30 mg Oral Daily  . loratadine  10 mg Oral Daily  . mouth rinse  15 mL Mouth Rinse BID  . midodrine  10 mg Oral Q8H  . multivitamin  1 tablet Oral QHS  . mupirocin ointment  1 application Nasal BID  . pantoprazole (PROTONIX) IV  40 mg Intravenous Q12H  . pregabalin  75 mg Oral Daily  . senna-docusate  2 tablet Oral BID  . tiotropium  18 mcg Inhalation Daily  . vitamin C  500 mg Oral BID    Assessment: Patient admitted for CP from nursing facility, trops 1.89. Patient takes eliquis 5 mg bid PTA (unclear why) and unsure when last dose was.  Patient is being started on heparin drip for UA/ACS Baseline labs show HL >3.6, hgb 7.6, PT/INR 15.9/1.3  Patient remains on pantoprazole infusion. Patient had cardiac catheterization (3/17) showing occluded proximal right coronary artery left circumflex artery and significant stenosis of distal left main and LAD at 85%.   Heparin currently infusing at 950 units/hr.   Goal of Therapy:  APTT 66 - 102 seconds Heparin level 0.3-0.7 units/ml Monitor platelets by anticoagulation protocol: Yes   Plan:  Will continue current heparin rate of 950 units/hr. Will recheck aPTT at  0300.   Will continue manage heparin via aPTTs until anti-Xa levels correlate with aPTTs. Will obtain next anti-Xa level with am labs.   Pharmacy will continue to monitor and adjust per consult.   Priseis Cratty L 08/13/2018 9:00 PM

## 2018-08-13 NOTE — Progress Notes (Signed)
[facility] [place] NAME: [name]    MR#:  [id number]  DATE OF BIRTH:  1955-09-15  SUBJECTIVE:  CHIEF COMPLAINT:   Chief Complaint  Patient presents with  . Chest Pain   Came with chest pain.  Noted to have low hemoglobin and guaiac positive stool.  Also had elevated troponin so admitted to ICU with borderline blood pressure on heparin drip and  blood transfusion. No complains,.  He was noted to have significant coronary artery disease on cardiac cath.  No more GI bleed.  Hemoglobin is stable.  Blood pressure is stable.  REVIEW OF SYSTEMS:  CONSTITUTIONAL: No fever, fatigue or weakness.  EYES: No blurred or double vision.  EARS, NOSE, AND THROAT: No tinnitus or ear pain.  RESPIRATORY: No cough, shortness of breath, wheezing or hemoptysis.  CARDIOVASCULAR: Have chest pain, no orthopnea, edema.  GASTROINTESTINAL: No nausea, vomiting, diarrhea or abdominal pain.  GENITOURINARY: No dysuria, hematuria.  ENDOCRINE: No polyuria, nocturia,  HEMATOLOGY: No anemia, easy bruising or bleeding SKIN: No rash or lesion. MUSCULOSKELETAL: No joint pain or arthritis.   NEUROLOGIC: No tingling, numbness, weakness.  PSYCHIATRY: No anxiety or depression.   ROS  DRUG ALLERGIES:   Allergies  Allergen Reactions  . Dust Mite Extract Other (See Comments)    Reaction: unknown    VITALS:  Blood pressure 121/78, pulse 76, temperature 98.5 F (36.9 C), temperature source Oral, resp. rate 11, height 5\' 8"  (1.727 m), weight 96.4 kg, SpO2 100 %.  PHYSICAL EXAMINATION:  GENERAL:  63 y.o.-year-old patient lying in the bed with no acute distress.  EYES: Pupils equal, round, reactive to light and accommodation. No scleral icterus. Extraocular muscles intact.  HEENT: Head atraumatic, normocephalic. Oropharynx and nasopharynx clear.  NECK:  Supple, no jugular venous distention. No thyroid enlargement, no tenderness.  LUNGS: Normal breath sounds bilaterally,  no wheezing, rales,rhonchi or crepitation. No use of accessory muscles of respiration.  CARDIOVASCULAR: S1, S2 normal. No murmurs, rubs, or gallops.  ABDOMEN: Soft, nontender, nondistended. Bowel sounds present. No organomegaly or mass.  EXTREMITIES: No pedal edema, cyanosis, or clubbing.  NEUROLOGIC: Cranial nerves II through XII are intact. Muscle strength 4/5 in all extremities. Sensation intact. Gait not checked.  PSYCHIATRIC: The patient is alert and oriented x 3.  SKIN: No obvious rash, lesion, or ulcer.   Physical Exam LABORATORY PANEL:   CBC Recent Labs  Lab 08/13/18 0412  WBC 8.2  HGB 9.3*  HCT 29.8*  PLT 185   ------------------------------------------------------------------------------------------------------------------  Chemistries  Recent Labs  Lab 08/10/18 0243  08/12/18 2249  NA 138   < > 138  K 4.1   < > 4.0  CL 103   < > 99  CO2 21*   < > 30  GLUCOSE 131*   < > 115*  BUN 67*   < > 36*  CREATININE 10.25*   < > 6.74*  CALCIUM 9.1   < > 8.7*  AST 35  --   --   ALT 29  --   --   ALKPHOS 82  --   --   BILITOT 0.3  --   --    < > = values in this interval not displayed.   ------------------------------------------------------------------------------------------------------------------  Cardiac Enzymes Recent Labs  Lab 08/11/18 0624 08/13/18 1213  TROPONINI 7.34* 7.08*   ------------------------------------------------------------------------------------------------------------------  RADIOLOGY:  Nm Myocar Multi W/spect W/wall Motion / Ef  Result Date: 08/13/2018  Blood pressure demonstrated a blunted response  to exercise.  Baseline left bundle branch block uninterpretable EKG  This is a high risk study.  The left ventricular ejection fraction is severely decreased (<30%).  Nuclear stress EF: 16%.  Conclusion Adequate chemical stress with baseline abnormal EKG anginal symptoms during infusion Relieved with nitroglycerin    ASSESSMENT AND  PLAN:   Active Problems:   Non-STEMI (non-ST elevated myocardial infarction) (HCC)   Acute GI bleeding  #1.  None-ST elevation MI. on IV heparin per Dr. Alveria Apley recommendation while transfusing the patient 2 units of packed red blood cells for his anemia.  follow serial cardiac enzymes and EKGs.   on IV nitroglycerin drip as well as PRN IV morphine sulfate.    holding off Eliquis in addition to aspirin given his GI bleeding.    2D echo will be obtained. Start betablocker and statin. Significant blockages in LAD and left main coronary and occluded proximal right coronary and proximal left circumflex.  Cardiology has suggested to do nuclear medicine stress study to have functional assessment to decide about further work-up. Nuclear stress test showed significant decline in ejection fraction up to 16% and high risk study.  2.  GI bleeding with acute blood loss anemia.  This is likely acute on top of chronic anemia of end-stage renal disease.  strongly positive stool Hemoccult.    follow hemoglobin hematocrit levels.    transfused 2 units of packed red blood cells.  Dr. Nehemiah Massed is aware about his GI bleeding and given his acute non-STEMI recommended IV heparin while transfusing the patient.    aspirin and Eliquis will be held off. Appreciated GI consult, as now patient's GI bleed seems to be stopped and hemoglobin stable, GI suggesting due to high risk cardiology status no need for procedures for now unless patient start bleeding again.  3.  End-stage renal disease on hemodialysis likely with associated fluid overload could be related to acute on top of chronic systolic CHF.  Nephrology consultation   PhosLo and Renvela will be continued.  Cont HD per nephro inpatient.  4.  COPD.  The Spiriva will be continued and he will be placed on continued care and Combivent.  5.  Dyslipidemia.  Lipitor dose will be increased .  6.  Hypertension.  His Coreg will be resumed.  We will monitor  his blood pressure given usual hypotension.  7.  DVT prophylaxis.  This is covered with IV heparin and GI prophylaxis was addressed above pain of the     All the records are reviewed and case discussed with Care Management/Social Workerr. Management plans discussed with the patient, family and they are in agreement.  CODE STATUS: Full  TOTAL TIME TAKING CARE OF THIS PATIENT: 35 minutes.     POSSIBLE D/C IN2-3 DAYS, DEPENDING ON CLINICAL CONDITION.   Vaughan Basta M.D on 08/13/2018   Between 7am to 6pm - Pager - [phone]  After 6pm go to [email or web address] - password [email or web address] [facility]  Office  [phone]  CC: Primary care physician; Donnie Coffin, MD  Note: This dictation was prepared with Dragon dictation along with smaller phrase technology. Any transcriptional errors that result from this process are unintentional.

## 2018-08-13 NOTE — Progress Notes (Signed)
[facility]  ROUNDING NOTE   Subjective:  Patient completed stress test earlier today. Resting comfortably in bed at the moment. Daughter at bedside as well. Patient had hemodialysis yesterday.   Objective:  Vital signs in last 24 hours:  Temp:  [98 F (36.7 C)-100.3 F (37.9 C)] 98.5 F (36.9 C) (03/19 1200) Pulse Rate:  [71-91] 78 (03/19 1300) Resp:  [9-23] 11 (03/19 1300) BP: (100-136)/(66-91) 110/67 (03/19 1300) SpO2:  [95 %-100 %] 99 % (03/19 1300) Weight:  [96.4 kg] 96.4 kg (03/19 0321)  Weight change: 0.9 kg Filed Weights   08/12/18 0930 08/12/18 1315 08/13/18 0321  Weight: 97.2 kg 95.7 kg 96.4 kg    Intake/Output: I/O last 3 completed shifts: In: 1299.9 [P.O.:220; I.V.:1079.9] Out: 1500 [Other:1500]   Intake/Output this shift:  Total I/O In: 43.1 [I.V.:43.1] Out: -   Physical Exam: General: No acute distress  Head: Normocephalic, atraumatic. Moist oral mucosal membranes  Eyes: Anicteric  Neck: Supple, trachea midline  Lungs:  Clear to auscultation, normal effort  Heart: S1S2 no rubs  Abdomen:  Soft, nontender, bowel sounds present  Extremities: Trace peripheral edema, L BKA  Neurologic: Awake, alert, following commands  Skin: No lesions  Access: LUE AVF    Basic Metabolic Panel: Recent Labs  Lab 08/10/18 0243 08/11/18 0453 08/12/18 0613 08/12/18 1417 08/12/18 2249  NA 138 140 139  --  138  K 4.1 4.5 5.1  --  4.0  CL 103 103 100  --  99  CO2 21* 27 27  --  30  GLUCOSE 131* 88 110*  --  115*  BUN 67* 39* 57*  --  36*  CREATININE 10.25* 7.30* 8.81*  --  6.74*  CALCIUM 9.1 8.9 9.1  --  8.7*  PHOS 5.2*  --  5.6* 3.5 4.5    Liver Function Tests: Recent Labs  Lab 08/10/18 0243 08/12/18 0613 08/12/18 2249  AST 35  --   --   ALT 29  --   --   ALKPHOS 82  --   --   BILITOT 0.3  --   --   PROT 6.6  --   --   ALBUMIN 3.0* 3.0* 2.7*   Recent Labs  Lab 08/10/18 0243  LIPASE 86*   No results for input(s): AMMONIA in the  last 168 hours.  CBC: Recent Labs  Lab 08/10/18 0243 08/11/18 0453 08/12/18 0613 08/13/18 0412  WBC 8.9 10.7* 8.9 8.2  NEUTROABS 5.5  --   --   --   HGB 7.6* 9.3* 9.6* 9.3*  HCT 24.2* 29.6* 31.4* 29.8*  MCV 100.4* 97.4 98.7 97.7  PLT 209 205 189 185    Cardiac Enzymes: Recent Labs  Lab 08/10/18 0243 08/11/18 0624 08/13/18 1213  TROPONINI 1.89* 7.34* 7.08*    BNP: Invalid input(s): POCBNP  CBG: Recent Labs  Lab 08/10/18 0644 08/12/18 1936  GLUCAP 104* 36    Microbiology: Results for orders placed or performed during the hospital encounter of 08/10/18  MRSA PCR Screening     Status: Abnormal   Collection Time: 08/10/18  6:54 AM  Result Value Ref Range Status   MRSA by PCR POSITIVE (A) NEGATIVE Final    Comment:        The GeneXpert MRSA Assay (FDA approved for NASAL specimens only), is one component of a comprehensive MRSA colonization surveillance program. It is not intended to diagnose MRSA infection nor to guide or monitor treatment for MRSA infections. RESULT CALLED TO, READ  BACK BY AND VERIFIED WITH:  Dannielle Huh AT 3532 08/10/18 KM/SDR Performed at [facility], [address] [place], [place] [address]     Coagulation Studies: Recent Labs    08/11/18 0453  LABPROT 14.9  INR 1.2    Urinalysis: No results for input(s): COLORURINE, LABSPEC, PHURINE, GLUCOSEU, HGBUR, BILIRUBINUR, KETONESUR, PROTEINUR, UROBILINOGEN, NITRITE, LEUKOCYTESUR in the last 72 hours.  Invalid input(s): APPERANCEUR    Imaging: No results found.   Medications:   . heparin 950 Units/hr (08/13/18 0300)   . aspirin EC  81 mg Oral Daily  . atorvastatin  80 mg Oral q1800  . bisacodyl  10 mg Rectal Once  . carvedilol  6.25 mg Oral BID  . Chlorhexidine Gluconate Cloth  6 each Topical Q0600  . Chlorhexidine Gluconate Cloth  6 each Topical Q0600  . epoetin (EPOGEN/PROCRIT) injection  4,000 Units Intravenous Q M,W,F-HD  . erythromycin  1 application  Right Eye QHS  . feeding supplement (NEPRO CARB STEADY)  237 mL Oral BID BM  . fluticasone  1 spray Each Nare Daily  . isosorbide mononitrate  30 mg Oral Daily  . loratadine  10 mg Oral Daily  . mouth rinse  15 mL Mouth Rinse BID  . midodrine  10 mg Oral Q8H  . multivitamin  1 tablet Oral QHS  . mupirocin ointment  1 application Nasal BID  . pantoprazole (PROTONIX) IV  40 mg Intravenous Q12H  . pregabalin  75 mg Oral Daily  . senna-docusate  2 tablet Oral BID  . tiotropium  18 mcg Inhalation Daily  . vitamin C  500 mg Oral BID   acetaminophen, albuterol, guaiFENesin, ipratropium-albuterol, morphine injection, nitroGLYCERIN, ondansetron (ZOFRAN) IV, polyethylene glycol, traMADol, zolpidem  Assessment/ Plan:  63 y.o. male with ESRD on hemodialysis, hypertension, hyperlipidemia, anemia, COPD, peripheral vascular disease, left BKA, right toe amputations  MWF [facility].   1.  End-stage renal disease 2.  Chest pain. 3.  Anemia of chronic kidney disease 4.  Chronic hypotension 5.  Secondary hyperparathyroidism  Plan: patient completed hemodialysis yesterday.  He tolerated this quite well yesterday.  We will plan for dialysis again tomorrow.  Orders to be prepared.  We will maintain the patient on Epogen for treatment of anemia chronic kidney disease.  He will also be continued on midodrine fortreatment of chronic hypotension.  We plan to recheck serum phosphorous 4.  Cardiology also following the patient and patient did have stress test performed today.   LOS: 3 Hollis Tuller 3/19/20202:19 PM

## 2018-08-13 NOTE — Progress Notes (Addendum)
[facility] for heparin Indication: chest pain/ACS  Allergies  Allergen Reactions  . Dust Mite Extract Other (See Comments)    Reaction: unknown    Patient Measurements: Height: 5\' 8"  (172.7 cm) Weight: 210 lb 15.7 oz (95.7 kg) IBW/kg (Calculated) : 68.4 Heparin Dosing Weight: 90.4 kg  Vital Signs: Temp: 98.3 F (36.8 C) (03/18 2300) Temp Source: Oral (03/18 2300) BP: 109/71 (03/18 2300) Pulse Rate: 81 (03/18 2300)  Labs: Recent Labs    08/10/18 0243 08/10/18 0415  08/11/18 0453 08/11/18 0624  08/12/18 0613 08/12/18 1417 08/12/18 2249  HGB 7.6*  --   --  9.3*  --   --  9.6*  --   --   HCT 24.2*  --   --  29.6*  --   --  31.4*  --   --   PLT 209  --   --  205  --   --  189  --   --   APTT  --  36   < > 114*  --    < > 74* 63* 69*  LABPROT  --  15.9*  --  14.9  --   --   --   --   --   INR  --  1.3*  --  1.2  --   --   --   --   --   HEPARINUNFRC  --  >3.60*  --  3.16*  --   --  2.20*  --   --   CREATININE 10.25*  --   --  7.30*  --   --  8.81*  --  6.74*  TROPONINI 1.89*  --   --   --  7.34*  --   --   --   --    < > = values in this interval not displayed.    Estimated Creatinine Clearance: 12.4 mL/min (A) (by C-G formula based on SCr of 6.74 mg/dL (H)).   Medical History: Past Medical History:  Diagnosis Date  . 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 Amputee, below knee, left ([place])   . Anemia   . Asthma   . Cardiomyopathy ([place])   . CHF (congestive heart failure) ([place])   . Chronic systolic heart failure ([place])   . Complication of anesthesia    hypotension  . COPD (chronic obstructive pulmonary disease) ([place])   . Coronary artery disease   . Dialysis patient ([place])    Mon, Wed, Fri  . End stage renal disease ([place])   . GERD (gastroesophageal reflux disease)   . Headache   . History of kidney stones   . History of pulmonary embolism   . HLD (hyperlipidemia)   . HTN (hypertension)   . Hyperparathyroidism    . Myocardial infarction ([place])   . Peripheral vascular disease ([place])   . Shortness of breath dyspnea   . Sleep apnea    NO C-PAP, Patient stated in process of  "getting one"   . Tobacco dependence     Medications:  Scheduled:  . aspirin EC  81 mg Oral Daily  . atorvastatin  80 mg Oral q1800  . carvedilol  6.25 mg Oral BID  . Chlorhexidine Gluconate Cloth  6 each Topical Q0600  . Chlorhexidine Gluconate Cloth  6 each Topical Q0600  . epoetin (EPOGEN/PROCRIT) injection  4,000 Units Intravenous Q M,W,F-HD  . erythromycin  1 application Right Eye QHS  . feeding supplement (  NEPRO CARB STEADY)  237 mL Oral BID BM  . fluticasone  1 spray Each Nare Daily  . isosorbide mononitrate  30 mg Oral Daily  . loratadine  10 mg Oral Daily  . mouth rinse  15 mL Mouth Rinse BID  . midodrine  10 mg Oral Q8H  . multivitamin  1 tablet Oral QHS  . mupirocin ointment  1 application Nasal BID  . pregabalin  75 mg Oral Daily  . tiotropium  18 mcg Inhalation Daily  . vitamin C  500 mg Oral BID    Assessment: Patient admitted for CP from nursing facility, trops 1.89. Patient takes eliquis 5 mg bid PTA (unclear why) and unsure when last dose was.  Patient is being started on heparin drip for UA/ACS Baseline labs show HL >3.6, hgb 7.6, PT/INR 15.9/1.3  Patient remains on pantoprazole infusion. Patient had cardiac catheterization (3/17) showing occluded proximal right coronary artery left circumflex artery and significant stenosis of distal left main and LAD at 85%.   Heparin currently infusing at 900 units/hr.   Goal of Therapy:  APTT 66 - 102 seconds Heparin level 0.3-0.7 units/ml Monitor platelets by anticoagulation protocol: Yes   Plan:  3/18 @ 2249 aPTT: 69.  Continue current heparin rate of 950 units/hr. Will recheck aPTT in 8 Hours.    Will continue manage heparin via aPTTs until anti-Xa levels correlate with aPTTs. Will obtain next anti-Xa level with am labs.   Pharmacy will continue to  monitor and adjust per consult.   Pernell Dupre, PharmD, BCPS Clinical Pharmacist 08/13/2018 12:06 AM

## 2018-08-14 LAB — RENAL FUNCTION PANEL
Albumin: 2.9 g/dL — ABNORMAL LOW (ref 3.5–5.0)
Anion gap: 14 (ref 5–15)
BUN: 65 mg/dL — ABNORMAL HIGH (ref 8–23)
CO2: 25 mmol/L (ref 22–32)
Calcium: 9 mg/dL (ref 8.9–10.3)
Chloride: 98 mmol/L (ref 98–111)
Creatinine, Ser: 10.78 mg/dL — ABNORMAL HIGH (ref 0.61–1.24)
GFR calc non Af Amer: 4 mL/min — ABNORMAL LOW (ref >60)
GFR, EST AFRICAN AMERICAN: 5 mL/min — AB (ref >60)
Glucose, Bld: 92 mg/dL (ref 70–99)
PHOSPHORUS: 6.2 mg/dL — AB (ref 2.5–4.6)
Potassium: 4.5 mmol/L (ref 3.5–5.1)
Sodium: 137 mmol/L (ref 135–145)

## 2018-08-14 LAB — APTT: aPTT: 81 seconds — ABNORMAL HIGH (ref 24–36)

## 2018-08-14 LAB — CBC
HCT: 28.9 % — ABNORMAL LOW (ref 39.0–52.0)
Hemoglobin: 8.9 g/dL — ABNORMAL LOW (ref 13.0–17.0)
MCH: 30.2 pg (ref 26.0–34.0)
MCHC: 30.8 g/dL (ref 30.0–36.0)
MCV: 98 fL (ref 80.0–100.0)
Platelets: 215 10*3/uL (ref 150–400)
RBC: 2.95 MIL/uL — ABNORMAL LOW (ref 4.22–5.81)
RDW: 15.6 % — ABNORMAL HIGH (ref 11.5–15.5)
WBC: 8.9 10*3/uL (ref 4.0–10.5)
nRBC: 0 % (ref 0.0–0.2)

## 2018-08-14 LAB — HEPARIN LEVEL (UNFRACTIONATED): Heparin Unfractionated: 1.1 IU/mL — ABNORMAL HIGH (ref 0.30–0.70)

## 2018-08-14 LAB — TROPONIN I: Troponin I: 5.29 ng/mL (ref <0.03)

## 2018-08-14 MED ORDER — HEPARIN SODIUM (PORCINE) 5000 UNIT/ML IJ SOLN
5000.0000 [IU] | Freq: Two times a day (BID) | INTRAMUSCULAR | Status: DC
  Administered 2018-08-15 (×2): 5000 [IU] via SUBCUTANEOUS
  Filled 2018-08-14 (×2): qty 1

## 2018-08-14 NOTE — Progress Notes (Signed)
Pt refusing am labs he is currently alert and oriented.  I explained the risk and the need to obtain labs due to recent chest pain, heparin gtt infusion, and acute blood loss anemia however he is still refusing labs.  Pt scheduled for hemodialysis today will place orders to collect labs during dialysis if pt agreeable.  Marda Stalker, [place] Pager [phone] (please enter 7 digits) PCCM Consult Pager [phone] (please enter 7 digits)

## 2018-08-14 NOTE — Progress Notes (Signed)
[facility] [place] NAME: [name]    MR#:  [id number]  DATE OF BIRTH:  February 26, 1956  SUBJECTIVE:  CHIEF COMPLAINT:   Chief Complaint  Patient presents with  . Chest Pain   Came with chest pain.  Noted to have low hemoglobin and guaiac positive stool.  Also had elevated troponin so admitted to ICU with borderline blood pressure on heparin drip and  blood transfusion. No complains,.  He was noted to have significant coronary artery disease on cardiac cath.  No more GI bleed.  Hemoglobin is stable.  Blood pressure is stable. His troponin is elevated and he had some pain after having stress test yesterday.  Blood pressure remained stable and awaiting cardiology guidance.  REVIEW OF SYSTEMS:  CONSTITUTIONAL: No fever, fatigue or weakness.  EYES: No blurred or double vision.  EARS, NOSE, AND THROAT: No tinnitus or ear pain.  RESPIRATORY: No cough, shortness of breath, wheezing or hemoptysis.  CARDIOVASCULAR: Have chest pain, no orthopnea, edema.  GASTROINTESTINAL: No nausea, vomiting, diarrhea or abdominal pain.  GENITOURINARY: No dysuria, hematuria.  ENDOCRINE: No polyuria, nocturia,  HEMATOLOGY: No anemia, easy bruising or bleeding SKIN: No rash or lesion. MUSCULOSKELETAL: No joint pain or arthritis.   NEUROLOGIC: No tingling, numbness, weakness.  PSYCHIATRY: No anxiety or depression.   ROS  DRUG ALLERGIES:   Allergies  Allergen Reactions  . Dust Mite Extract Other (See Comments)    Reaction: unknown    VITALS:  Blood pressure (!) 130/92, pulse 80, temperature 98.5 F (36.9 C), temperature source Oral, resp. rate (!) 21, height 5\' 8"  (1.727 m), weight 94.4 kg, SpO2 100 %.  PHYSICAL EXAMINATION:  GENERAL:  63 y.o.-year-old patient lying in the bed with no acute distress.  EYES: Pupils equal, round, reactive to light and accommodation. No scleral icterus. Extraocular muscles intact.  HEENT: Head atraumatic, normocephalic.  Oropharynx and nasopharynx clear.  NECK:  Supple, no jugular venous distention. No thyroid enlargement, no tenderness.  LUNGS: Normal breath sounds bilaterally, no wheezing, rales,rhonchi or crepitation. No use of accessory muscles of respiration.  CARDIOVASCULAR: S1, S2 normal. No murmurs, rubs, or gallops.  ABDOMEN: Soft, nontender, nondistended. Bowel sounds present. No organomegaly or mass.  EXTREMITIES: No pedal edema, cyanosis, or clubbing.  NEUROLOGIC: Cranial nerves II through XII are intact. Muscle strength 4/5 in all extremities. Sensation intact. Gait not checked.  PSYCHIATRIC: The patient is alert and oriented x 3.  SKIN: No obvious rash, lesion, or ulcer.   Physical Exam LABORATORY PANEL:   CBC Recent Labs  Lab 08/13/18 0412  WBC 8.2  HGB 9.3*  HCT 29.8*  PLT 185   ------------------------------------------------------------------------------------------------------------------  Chemistries  Recent Labs  Lab 08/10/18 0243  08/12/18 2249  NA 138   < > 138  K 4.1   < > 4.0  CL 103   < > 99  CO2 21*   < > 30  GLUCOSE 131*   < > 115*  BUN 67*   < > 36*  CREATININE 10.25*   < > 6.74*  CALCIUM 9.1   < > 8.7*  AST 35  --   --   ALT 29  --   --   ALKPHOS 82  --   --   BILITOT 0.3  --   --    < > = values in this interval not displayed.   ------------------------------------------------------------------------------------------------------------------  Cardiac Enzymes Recent Labs  Lab 08/13/18 1213 08/13/18 1855  TROPONINI 7.08* 6.33*   ------------------------------------------------------------------------------------------------------------------  RADIOLOGY:  Nm Myocar Multi W/spect W/wall Motion / Ef  Result Date: 08/13/2018  Blood pressure demonstrated a blunted response to exercise.  Baseline left bundle branch block uninterpretable EKG  This is a high risk study.  The left ventricular ejection fraction is severely decreased (<30%).  Nuclear  stress EF: 16%.  Conclusion Adequate chemical stress with baseline abnormal EKG anginal symptoms during infusion Relieved with nitroglycerin    ASSESSMENT AND PLAN:   Active Problems:   Non-STEMI (non-ST elevated myocardial infarction) (HCC)   Acute GI bleeding  #1.  None-ST elevation MI. on IV heparin per Dr. Alveria Apley recommendation while transfusing the patient 2 units of packed red blood cells for his anemia.  follow serial cardiac enzymes and EKGs.   on IV nitroglycerin drip as well as PRN IV morphine sulfate.    holding off Eliquis in addition to aspirin given his GI bleeding.    2D echo will be obtained. Start betablocker and statin. Significant blockages in LAD and left main coronary and occluded proximal right coronary and proximal left circumflex.  Cardiology has suggested to do nuclear medicine stress study to have functional assessment to decide about further work-up. Nuclear stress test showed significant decline in ejection fraction up to 16% and high risk study. He also had chest pain after the stress test.  Cardiology has suggested to optimize medical management and possibly no surgical interventions. We are still waiting for the recommendation from cardiology as patient's troponin is elevated.  2.  GI bleeding with acute blood loss anemia.  This is likely acute on top of chronic anemia of end-stage renal disease.  strongly positive stool Hemoccult.    follow hemoglobin hematocrit levels.    transfused 2 units of packed red blood cells.  Dr. Nehemiah Massed is aware about his GI bleeding and given his acute non-STEMI recommended IV heparin while transfusing the patient.    aspirin and Eliquis will be held off. Appreciated GI consult, as now patient's GI bleed seems to be stopped and hemoglobin stable, GI suggesting due to high risk cardiology status no need for procedures for now unless patient start bleeding again. Patient had refused for blood work today.  3.  End-stage  renal disease on hemodialysis likely with associated fluid overload could be related to acute on top of chronic systolic CHF.  Nephrology consultation   PhosLo and Renvela will be continued.  Cont HD per nephro inpatient.  4.  COPD.  The Spiriva will be continued and he will be placed on continued care and Combivent.  5.  Dyslipidemia.  Lipitor dose will be increased .  6.  Hypertension.  His Coreg will be resumed.  We will monitor his blood pressure given usual hypotension.  7.  DVT prophylaxis.  This is covered with IV heparin and GI prophylaxis was addressed above pain of the     All the records are reviewed and case discussed with Care Management/Social Workerr. Management plans discussed with the patient, family and they are in agreement.  CODE STATUS: Full  TOTAL TIME TAKING CARE OF THIS PATIENT: 35 minutes.    POSSIBLE D/C IN2-3 DAYS, DEPENDING ON CLINICAL CONDITION.   Vaughan Basta M.D on 08/14/2018   Between 7am to 6pm - Pager - [phone]  After 6pm go to [email or web address] - password [email or web address] [facility]  Office  [phone]  CC: Primary care physician; Donnie Coffin, MD  Note: This dictation was prepared with Dragon dictation along with smaller phrase technology. Any transcriptional  errors that result from this process are unintentional.

## 2018-08-14 NOTE — Progress Notes (Signed)
CRITICAL CARE NOTE        SUBJECTIVE FINDINGS & SIGNIFICANT EVENTS   Patient remains critically ill. Prognosis is guarded Patient refused blood work, he has HD scheduled today.  Discussed case with Cardiology Dr Clayborn Bigness - plan to maximize medical therapy.   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   Amputee, below knee, left (HCC)    Anemia    Asthma    Cardiomyopathy ([place])    CHF (congestive heart failure) (HCC)    Chronic systolic heart failure (HCC)    Complication of anesthesia    hypotension   COPD (chronic obstructive pulmonary disease) (HCC)    Coronary artery disease    Dialysis patient ([place])    Mon, Wed, Fri   End stage renal disease ([place])    GERD (gastroesophageal reflux disease)    Headache    History of kidney stones    History of pulmonary embolism    HLD (hyperlipidemia)    HTN (hypertension)    Hyperparathyroidism    Myocardial infarction ([place])    Peripheral vascular disease (HCC)    Shortness of breath dyspnea    Sleep apnea    NO C-PAP, Patient stated in process of  "getting one"    Tobacco dependence      SURGICAL HISTORY   Past Surgical History:  Procedure Laterality Date   A/V FISTULAGRAM Left 08/14/2017   Procedure: A/V FISTULAGRAM;  Surgeon: Algernon Huxley, MD;  Location: [facility];  Service: Cardiovascular;  Laterality: Left;   AMPUTATION Left 05/06/2014   Procedure: AMPUTATION BELOW KNEE;  Surgeon: Elam Dutch, MD;  Location: [place];  Service: Vascular;  Laterality: Left;   AMPUTATION Right 01/12/2015   Procedure: Foot transmetatarsal amputation;  Surgeon: Algernon Huxley, MD;  Location: [facility];  Service: Vascular;  Laterality: Right;   APPLICATION OF WOUND  VAC Right 03/01/2015   Procedure: Application of Bio-connekt graft and wound vac application to right foot ;  Surgeon: Algernon Huxley, MD;  Location: [facility];  Service: Vascular;  Laterality: Right;   AV FISTULA PLACEMENT Left    AV FISTULA PLACEMENT Left 11/28/2016   Procedure: ARTERIOVENOUS (AV) FISTULA CREATION;  Surgeon: Algernon Huxley, MD;  Location: [facility];  Service: Vascular;  Laterality: Left;   CARDIAC CATHETERIZATION     stent placement    CORONARY ANGIOPLASTY     DIALYSIS/PERMA CATHETER INSERTION N/A 07/31/2016   Procedure: Dialysis/Perma Catheter Insertion;  Surgeon: Algernon Huxley, MD;  Location: [facility] [place] [facility];  Service: Cardiovascular;  Laterality: N/A;   DIALYSIS/PERMA CATHETER INSERTION N/A 07/25/2017   Procedure: DIALYSIS/PERMA CATHETER INSERTION and fistulagram;  Surgeon: Algernon Huxley, MD;  Location: [place] [facility];  Service: Cardiovascular;  Laterality: N/A;   DIALYSIS/PERMA CATHETER REMOVAL N/A 07/22/2017   Procedure: DIALYSIS/PERMA CATHETER REMOVAL;  Surgeon: Katha Cabal, MD;  Location: [place] [facility];  Service: Cardiovascular;  Laterality: N/A;   DIALYSIS/PERMA CATHETER REMOVAL N/A 02/05/2018   Procedure: DIALYSIS/PERMA CATHETER REMOVAL;  Surgeon: Algernon Huxley, MD;  Location: [place] [facility];  Service: Cardiovascular;  Laterality: N/A;   IR FLUORO GUIDE CV LINE LEFT  04/10/2017   LEFT HEART CATH AND CORONARY ANGIOGRAPHY N/A 08/11/2018   Procedure: LEFT HEART CATH AND CORONARY ANGIOGRAPHY;  Surgeon: Corey Skains, MD;  Location: [place] [facility];  Service: Cardiovascular;  Laterality: N/A;  LIGATION OF ARTERIOVENOUS  FISTULA Right 01/31/2016   Procedure: LIGATION OF ARTERIOVENOUS  FISTULA;  Surgeon: Algernon Huxley, MD;  Location: [facility];  Service: Vascular;  Laterality: Right;   PERIPHERAL VASCULAR CATHETERIZATION Right 12/15/2014   Procedure: Lower Extremity Angiography;  Surgeon: Algernon Huxley, MD;  Location: [place] [facility];  Service:  Cardiovascular;  Laterality: Right;   PERIPHERAL VASCULAR CATHETERIZATION  12/15/2014   Procedure: Lower Extremity Intervention;  Surgeon: Algernon Huxley, MD;  Location: [place] [facility];  Service: Cardiovascular;;   PERIPHERAL VASCULAR CATHETERIZATION Right 08/14/2015   Procedure: A/V Shuntogram/Fistulagram;  Surgeon: Algernon Huxley, MD;  Location: [place];  Service: Cardiovascular;  Laterality: Right;   PERIPHERAL VASCULAR CATHETERIZATION N/A 08/14/2015   Procedure: A/V Shunt Intervention;  Surgeon: Algernon Huxley, MD;  Location: [place] [facility];  Service: Cardiovascular;  Laterality: N/A;   PERIPHERAL VASCULAR CATHETERIZATION N/A 01/11/2016   Procedure: Dialysis/Perma Catheter Insertion;  Surgeon: Algernon Huxley, MD;  Location: [place] [facility];  Service: Cardiovascular;  Laterality: N/A;   REVISON OF ARTERIOVENOUS FISTULA Right 02/17/2016   Procedure: removal of AV fistula;  Surgeon: Serafina Mitchell, MD;  Location: [facility];  Service: Vascular;  Laterality: Right;   REVISON OF ARTERIOVENOUS FISTULA Right 01/31/2016   Procedure: REVISON OF ARTERIOVENOUS FISTULA ( BRACHIOCEPHALIC ) W/ ARTEGRAFT;  Surgeon: Algernon Huxley, MD;  Location: [facility];  Service: Vascular;  Laterality: Right;   TRANSMETATARSAL AMPUTATION Right 05/04/2015   Procedure: TRANSMETATARSAL AMPUTATION REVISION, great toe amputation;  Surgeon: Algernon Huxley, MD;  Location: [facility];  Service: Vascular;  Laterality: Right;     FAMILY HISTORY   Family History  Problem Relation Age of Onset   Leukemia Mother    Heart attack Father    Heart failure Other    Hypertension Other    Leukemia Other    Diabetes Other    Prostate cancer Neg Hx    Kidney cancer Neg Hx    Bladder Cancer Neg Hx      SOCIAL HISTORY   Social History   Tobacco Use   Smoking status: Light Tobacco Smoker    Packs/day: 0.25    Years: 30.00    Pack years: 7.50    Types: Cigarettes   Smokeless tobacco: Never Used    Tobacco comment: 5  Substance Use Topics   Alcohol use: No    Alcohol/week: 0.0 standard drinks   Drug use: No    Comment: has used crack cocaine in past      MEDICATIONS   Current Medication:  Current Facility-Administered Medications:    acetaminophen (TYLENOL) tablet 650 mg, 650 mg, Oral, Q4H PRN, Mansy, Jan A, MD   albuterol (PROVENTIL) (2.5 MG/3ML) 0.083% nebulizer solution 2.5 mg, 2.5 mg, Inhalation, Q6H PRN, Mansy, Jan A, MD   aspirin EC tablet 81 mg, 81 mg, Oral, Daily, Mansy, Jan A, MD, 81 mg at 08/13/18 1043   atorvastatin (LIPITOR) tablet 80 mg, 80 mg, Oral, q1800, Mansy, Jan A, MD, 80 mg at 08/13/18 1831   bisacodyl (DULCOLAX) suppository 10 mg, 10 mg, Rectal, Once, Ottie Glazier, MD   carvedilol (COREG) tablet 6.25 mg, 6.25 mg, Oral, BID, Mansy, Jan A, MD, 6.25 mg at 08/13/18 1043   Chlorhexidine Gluconate Cloth 2 % PADS 6 each, 6 each, Topical, Q0600, Murlean Iba, MD, 6 each at 08/13/18 2300   Chlorhexidine Gluconate Cloth 2 % PADS 6 each, 6 each, Topical, Q0600, Ottie Glazier, MD, 6  each at 08/13/18 2300   epoetin alfa (EPOGEN,PROCRIT) injection 4,000 Units, 4,000 Units, Intravenous, Q M,W,F-HD, Holley Raring, Munsoor, MD, 4,000 Units at 08/12/18 1200   erythromycin ophthalmic ointment 1 application, 1 application, Right Eye, QHS, Mansy, Jan A, MD   feeding supplement (NEPRO CARB STEADY) liquid 237 mL, 237 mL, Oral, BID BM, Lateef, Munsoor, MD, 237 mL at 08/13/18 1354   fluticasone (FLONASE) 50 MCG/ACT nasal spray 1 spray, 1 spray, Each Nare, Daily, Mansy, Jan A, MD, 1 spray at 08/13/18 1050   guaiFENesin (ROBITUSSIN) 100 MG/5ML solution 200 mg, 200 mg, Oral, TID PRN, Mansy, Jan A, MD   heparin ADULT infusion 100 units/mL (25000 units/28mL sodium chloride 0.45%), 950 Units/hr, Intravenous, Continuous, Charlett Nose, RPH, Last Rate: 9.5 mL/hr at 08/14/18 0600, 950 Units/hr at 08/14/18 0600   ipratropium-albuterol (DUONEB) 0.5-2.5 (3) MG/3ML nebulizer  solution 3 mL, 3 mL, Inhalation, Q6H PRN, Mansy, Jan A, MD   isosorbide mononitrate (IMDUR) 24 hr tablet 30 mg, 30 mg, Oral, Daily, Mansy, Jan A, MD, 30 mg at 08/13/18 1043   loratadine (CLARITIN) tablet 10 mg, 10 mg, Oral, Daily, Mansy, Jan A, MD, 10 mg at 08/13/18 1043   MEDLINE mouth rinse, 15 mL, Mouth Rinse, BID, Nikitha Mode, MD, 15 mL at 08/12/18 2113   midodrine (PROAMATINE) tablet 10 mg, 10 mg, Oral, Q8H, Mansy, Jan A, MD, 10 mg at 08/13/18 2125   morphine 2 MG/ML injection 2 mg, 2 mg, Intravenous, Q4H PRN, Darel Hong D, NP, 2 mg at 08/13/18 1047   multivitamin (RENA-VIT) tablet 1 tablet, 1 tablet, Oral, QHS, Charlett Nose, RPH, 1 tablet at 08/13/18 2125   mupirocin ointment (BACTROBAN) 2 % 1 application, 1 application, Nasal, BID, Shantel Wesely, MD, 1 application at 16/10/96 2130   nitroGLYCERIN (NITROSTAT) SL tablet 0.4 mg, 0.4 mg, Sublingual, Q5 min PRN, Darel Hong D, NP, 0.4 mg at 08/13/18 0856   ondansetron (ZOFRAN) injection 4 mg, 4 mg, Intravenous, Q6H PRN, Mansy, Jan A, MD   pantoprazole (PROTONIX) injection 40 mg, 40 mg, Intravenous, Q12H, Rebbeca Sheperd, MD, 40 mg at 08/13/18 2130   polyethylene glycol (MIRALAX / GLYCOLAX) packet 17 g, 17 g, Oral, Daily PRN, Mansy, Jan A, MD   pregabalin (LYRICA) capsule 75 mg, 75 mg, Oral, Daily, Mansy, Jan A, MD, 75 mg at 08/13/18 1043   senna-docusate (Senokot-S) tablet 2 tablet, 2 tablet, Oral, BID, Ottie Glazier, MD, 2 tablet at 08/13/18 2125   tiotropium (SPIRIVA) inhalation capsule ([facility] use ONLY) 18 mcg, 18 mcg, Inhalation, Daily, Mansy, Jan A, MD, 18 mcg at 08/13/18 1049   traMADol (ULTRAM) tablet 75 mg, 75 mg, Oral, Q8H PRN, Mansy, Jan A, MD, 75 mg at 08/11/18 0530   vitamin C (ASCORBIC ACID) tablet 500 mg, 500 mg, Oral, BID, Charlett Nose, RPH, 500 mg at 08/13/18 2125   zolpidem (AMBIEN) tablet 5 mg, 5 mg, Oral, QHS PRN,MR X 1, Mansy, Jan A, MD, 5 mg at 08/11/18 2257    ALLERGIES   Dust  mite extract    REVIEW OF SYSTEMS   10 system ROS conducted and is negative except as per subjective findings  PHYSICAL EXAMINATION   Vitals:   08/14/18 0500 08/14/18 0600  BP: 100/76 113/81  Pulse: 94 95  Resp: 14 15  Temp:    SpO2: 92% 95%    GENERAL: Patient expresses general frustration due to located hospital course. HEAD: Normocephalic, atraumatic.  EYES: Pupils equal, round, reactive to light.  No scleral icterus.  MOUTH: Moist mucosal membrane. NECK: Supple. No thyromegaly. No nodules. No JVD.  PULMONARY: Bibasilar crackles decreased breath sounds CARDIOVASCULAR: S1 and S2. Regular rate and rhythm. No murmurs, rubs, or gallops.  GASTROINTESTINAL: Soft, nontender, non-distended. No masses. Positive bowel sounds. No hepatosplenomegaly.  MUSCULOSKELETAL: No swelling, clubbing, or edema.  NEUROLOGIC: Mild distress due to acute illness SKIN:intact,warm,dry   LABS AND IMAGING     LAB RESULTS: Recent Labs  Lab 08/11/18 0453 08/12/18 0613 08/12/18 2249  NA 140 139 138  K 4.5 5.1 4.0  CL 103 100 99  CO2 27 27 30   BUN 39* 57* 36*  CREATININE 7.30* 8.81* 6.74*  GLUCOSE 88 110* 115*   Recent Labs  Lab 08/11/18 0453 08/12/18 0613 08/13/18 0412  HGB 9.3* 9.6* 9.3*  HCT 29.6* 31.4* 29.8*  WBC 10.7* 8.9 8.2  PLT 205 189 185     IMAGING RESULTS: No results found.    ASSESSMENT AND PLAN   Multidisciplinary rounds held today   Acute blood loss anemia -resolved  - Hematochezia - likely related to oral anticoagulant agent-resolved - H/H trending -PRBC transfusion with goal Hb>8 due to active CAD - GI evaluation-poss EGD/Cscope post cardio clearance -abd distension with constipation -s/p bowel regimen    AcuteHypoxic Respiratory Failure -continue supportive measures - [place] O2 goal O2 saturation 90%-noncompliant with cpap - currenlty on 4Lnc -continue Bronchodilator Therapy    CARDIAC FAILURE- ACS -NSTEMI - Cardiology on case  - appreciate input-status post left heart cath- lexiscan -discussed case with cardiology-plan for maximizing medical therapy -follow up cardiac enzymes as indicated ICU monitoring   Renal Failure-CKD4 -Nephrology on case - appreciate input -for HD in am    ID -continue IV abx as prescibed-nasal MRSA + -follow up cultures  GI/Nutrition GI PROPHYLAXIS as indicated DIET-->TF's as tolerated Constipation protocol as indicated  ENDO - ICU hypoglycemic\Hyperglycemia protocol -check FSBS per protocol   ELECTROLYTES -follow labs as needed -replace as needed -pharmacy consultation   DVT/GI PRX ordered -SCDs  TRANSFUSIONS AS NEEDED MONITOR FSBS ASSESS the need for LABS as needed   Critical care provider statement:  Critical care time (minutes):36 Critical care time was exclusive of: Separately billable procedures and treating other patients Critical care was necessary to treat or prevent imminent or life-threatening deterioration of the following conditions:ACS, LGIB, hypoxemia, DM comorbid Critical care was time spent personally by me on the following activities: Development of treatment plan with patient or surrogate, discussions with consultants, evaluation of patient's response to treatment, examination of patient, obtaining history from patient or surrogate, ordering and performing treatments and interventions, ordering and review of laboratory studies and re-evaluation of patient's condition. I assumed direction of critical care for this patient from another provider in my specialty: no  This document was prepared using Dragon voice recognition software and may include unintentional dictation errors.    Ottie Glazier, M.D.  Division of [facility]

## 2018-08-14 NOTE — Progress Notes (Signed)
Pre HD Tx   08/14/18 2100  Hand-Off documentation  Report given to (Full Name) Trellis Paganini RN  Report received from (Full Name) Manuella Ghazi RN  Vital Signs  Temp 98.8 F (37.1 C)  Temp Source Oral  Pulse Rate 87  Pulse Rate Source Monitor  Resp (!) 22  BP (!) 131/94  BP Location Right Arm  BP Method Automatic  Patient Position (if appropriate) Lying  Oxygen Therapy  SpO2 99 %  O2 Device Nasal Cannula  O2 Flow Rate (L/min) 4 L/min  Patient Activity (if Appropriate) In bed  Pulse Oximetry Type Continuous  Pain Assessment  Pain Scale 0-10  Pain Score 0  Dialysis Weight  Weight 94.4 kg  Type of Weight Pre-Dialysis  Time-Out for Hemodialysis  What Procedure? Hemodialysis   Pt Identifiers(min of two) First/Last Name;MRN/Account#  Correct Site? Yes  Correct Side? Yes  Correct Procedure? Yes  Consents Verified? Yes  Rad Studies Available? N/A  Safety Precautions Reviewed? Yes  Engineer, civil (consulting) Number 4  Station Number 4  UF/Alarm Test Passed  Conductivity: Meter 14  Conductivity: Machine  13.8  pH 7.2  Reverse Osmosis main  Normal Saline Lot Number [id number]  Dialyzer Lot Number [id number]  Disposable Set Lot Number [id number]  Machine Temperature 98.6 F (37 C)  Musician and Audible Yes  Blood Lines Intact and Secured Yes  Pre Treatment Patient Checks  Vascular access used during treatment Fistula  Hepatitis B Surface Antigen Results Negative  Date Hepatitis B Surface Antigen Drawn 10/13/17  Isolation Initiated Yes  Hepatitis B Surface Antibody  (>10)  Date Hepatitis B Surface Antibody Drawn 11/17/17  Hemodialysis Consent Verified Yes  Hemodialysis Standing Orders Initiated Yes  ECG (Telemetry) Monitor On Yes  Prime Ordered Normal Saline  Length of  DialysisTreatment -hour(s) 3.5 Hour(s)  Dialysis Treatment Comments Na 140  Dialyzer Elisio 17H NR  Dialysate 2K, 2.5 Ca  Dialysate Flow Ordered 800  Blood Flow Rate Ordered 400 mL/min   Ultrafiltration Goal 1.5 Liters  Dialysis Blood Pressure Support Ordered Normal Saline  Education / Care Plan  Dialysis Education Provided Yes  Documented Education in Care Plan Yes  Fistula / Graft Left Upper arm Arteriovenous fistula  No Placement Date or Time found.   Placed prior to admission: Yes  Orientation: Left  Access Location: Upper arm  Access Type: Arteriovenous fistula  Site Condition No complications  Fistula / Graft Assessment Present;Thrill;Bruit  Status Accessed  Needle Size 15  Drainage Description None

## 2018-08-14 NOTE — Progress Notes (Signed)
[facility]  ROUNDING NOTE   Subjective:  Patient seen at bedside. Remains on heparin drip. Due for dialysis today.    Objective:  Vital signs in last 24 hours:  Temp:  [98 F (36.7 C)-98.5 F (36.9 C)] 98 F (36.7 C) (03/20 0200) Pulse Rate:  [74-95] 95 (03/20 0600) Resp:  [9-24] 15 (03/20 0600) BP: (98-136)/(65-95) 113/81 (03/20 0600) SpO2:  [92 %-100 %] 95 % (03/20 0600) Weight:  [94.4 kg] 94.4 kg (03/20 0128)  Weight change: -2.8 kg Filed Weights   08/12/18 1315 08/13/18 0321 08/14/18 0128  Weight: 95.7 kg 96.4 kg 94.4 kg    Intake/Output: I/O last 3 completed shifts: In: 505.8 [I.V.:505.8] Out: -    Intake/Output this shift:  No intake/output data recorded.  Physical Exam: General: No acute distress  Head: Normocephalic, atraumatic. Moist oral mucosal membranes  Eyes: Anicteric  Neck: Supple, trachea midline  Lungs:  Clear to auscultation, normal effort  Heart: S1S2 no rubs  Abdomen:  Soft, nontender, bowel sounds present  Extremities: Trace peripheral edema, L BKA  Neurologic: Awake, alert, following commands  Skin: No lesions  Access: LUE AVF    Basic Metabolic Panel: Recent Labs  Lab 08/10/18 0243 08/11/18 0453 08/12/18 0613 08/12/18 1417 08/12/18 2249  NA 138 140 139  --  138  K 4.1 4.5 5.1  --  4.0  CL 103 103 100  --  99  CO2 21* 27 27  --  30  GLUCOSE 131* 88 110*  --  115*  BUN 67* 39* 57*  --  36*  CREATININE 10.25* 7.30* 8.81*  --  6.74*  CALCIUM 9.1 8.9 9.1  --  8.7*  PHOS 5.2*  --  5.6* 3.5 4.5    Liver Function Tests: Recent Labs  Lab 08/10/18 0243 08/12/18 0613 08/12/18 2249  AST 35  --   --   ALT 29  --   --   ALKPHOS 82  --   --   BILITOT 0.3  --   --   PROT 6.6  --   --   ALBUMIN 3.0* 3.0* 2.7*   Recent Labs  Lab 08/10/18 0243  LIPASE 86*   No results for input(s): AMMONIA in the last 168 hours.  CBC: Recent Labs  Lab 08/10/18 0243 08/11/18 0453 08/12/18 0613 08/13/18 0412  WBC 8.9  10.7* 8.9 8.2  NEUTROABS 5.5  --   --   --   HGB 7.6* 9.3* 9.6* 9.3*  HCT 24.2* 29.6* 31.4* 29.8*  MCV 100.4* 97.4 98.7 97.7  PLT 209 205 189 185    Cardiac Enzymes: Recent Labs  Lab 08/10/18 0243 08/11/18 0624 08/13/18 1213 08/13/18 1855  TROPONINI 1.89* 7.34* 7.08* 6.33*    BNP: Invalid input(s): POCBNP  CBG: Recent Labs  Lab 08/10/18 0644 08/12/18 1936  GLUCAP 104* 17    Microbiology: Results for orders placed or performed during the hospital encounter of 08/10/18  MRSA PCR Screening     Status: Abnormal   Collection Time: 08/10/18  6:54 AM  Result Value Ref Range Status   MRSA by PCR POSITIVE (A) NEGATIVE Final    Comment:        The GeneXpert MRSA Assay (FDA approved for NASAL specimens only), is one component of a comprehensive MRSA colonization surveillance program. It is not intended to diagnose MRSA infection nor to guide or monitor treatment for MRSA infections. RESULT CALLED TO, READ BACK BY AND VERIFIED WITH:  CHARLI FLEETWOOD AT 3646 08/10/18  KM/SDR Performed at [facility], [address] [place], [place] [address]     Coagulation Studies: No results for input(s): LABPROT, INR in the last 72 hours.  Urinalysis: No results for input(s): COLORURINE, LABSPEC, PHURINE, GLUCOSEU, HGBUR, BILIRUBINUR, KETONESUR, PROTEINUR, UROBILINOGEN, NITRITE, LEUKOCYTESUR in the last 72 hours.  Invalid input(s): APPERANCEUR    Imaging: Nm Myocar Multi W/spect W/wall Motion / Ef  Result Date: 08/13/2018  Blood pressure demonstrated a blunted response to exercise.  Baseline left bundle branch block uninterpretable EKG  This is a high risk study.  The left ventricular ejection fraction is severely decreased (<30%).  Nuclear stress EF: 16%.  Conclusion Adequate chemical stress with baseline abnormal EKG anginal symptoms during infusion Relieved with nitroglycerin     Medications:   . heparin 950 Units/hr (08/14/18 0600)   . aspirin EC  81 mg  Oral Daily  . atorvastatin  80 mg Oral q1800  . bisacodyl  10 mg Rectal Once  . carvedilol  6.25 mg Oral BID  . Chlorhexidine Gluconate Cloth  6 each Topical Q0600  . Chlorhexidine Gluconate Cloth  6 each Topical Q0600  . epoetin (EPOGEN/PROCRIT) injection  4,000 Units Intravenous Q M,W,F-HD  . erythromycin  1 application Right Eye QHS  . feeding supplement (NEPRO CARB STEADY)  237 mL Oral BID BM  . fluticasone  1 spray Each Nare Daily  . isosorbide mononitrate  30 mg Oral Daily  . loratadine  10 mg Oral Daily  . mouth rinse  15 mL Mouth Rinse BID  . midodrine  10 mg Oral Q8H  . multivitamin  1 tablet Oral QHS  . mupirocin ointment  1 application Nasal BID  . pantoprazole (PROTONIX) IV  40 mg Intravenous Q12H  . pregabalin  75 mg Oral Daily  . senna-docusate  2 tablet Oral BID  . tiotropium  18 mcg Inhalation Daily  . vitamin C  500 mg Oral BID   acetaminophen, albuterol, guaiFENesin, ipratropium-albuterol, morphine injection, nitroGLYCERIN, ondansetron (ZOFRAN) IV, polyethylene glycol, traMADol, zolpidem  Assessment/ Plan:  63 y.o. male with ESRD on hemodialysis, hypertension, hyperlipidemia, anemia, COPD, peripheral vascular disease, left BKA, right toe amputations  MWF [facility]ph[facility].   1.  End-stage renal disease 2.  Chest pain. 3.  Anemia of chronic kidney disease 4.  Chronic hypotension 5.  Secondary hyperparathyroidism  Plan: Patient due for hemodialysis today.  We will check labs as he declined them last night.  We will continue dialysis on MWF schedule.  Patient will be maintained on midodrine for blood pressure support.  We will also continue Epogen 4000 units IV with dialysis for treatment of anemia of chronic kidney disease.  We plan to recheck CBC today as most recent hemoglobin was 9.3.  Serum phosphorus also under good control at 4.5 at last check but we do plan to repeat this today.  Troponin at the moment is 6.3.   LOS: 4 Chriss Mannan 3/20/20209:58 AM

## 2018-08-14 NOTE — Progress Notes (Signed)
Patient declined CPAP at this time. On 4L [place] resting comfortably in bed.

## 2018-08-14 NOTE — Progress Notes (Signed)
CRITICAL VALUE ALERT  Critical Value:  Troponin 5.29  Date & Time Notied:  08/14/2018 at 2030  Provider Notified: Harless Nakayama, NP  Orders Received/Actions taken: Patient is asymptomatic and the level is trending down (last troponin 6.33).  No new orders at this time.  Will continue to monitor.  Cameron Ali, RN

## 2018-08-14 NOTE — Progress Notes (Signed)
The patient's hemoglobin has been stable and his troponin continues to be elevated.  There is no need for any GI intervention at this time in this patient with significant cardiac disease.  Please reconsult GI if the patient should have any further GI bleeding or drop in hemoglobin that is significant.  I will sign off.  Please call if any further GI concerns or questions.  We would like to thank you for the opportunity to participate in the care of [name].

## 2018-08-14 NOTE — Progress Notes (Signed)
[place] for heparin Indication: chest pain/ACS  Allergies  Allergen Reactions  . Dust Mite Extract Other (See Comments)    Reaction: unknown    Patient Measurements: Height: 5\' 8"  (172.7 cm) Weight: 208 lb 1.8 oz (94.4 kg) IBW/kg (Calculated) : 68.4 Heparin Dosing Weight: 90.4 kg  Vital Signs: Temp: 98.7 F (37.1 C) (03/20 1600) Temp Source: Oral (03/20 1600) BP: 133/92 (03/20 1900) Pulse Rate: 81 (03/20 1900)  Labs: Recent Labs    08/12/18 0613  08/12/18 2249 08/13/18 0412 08/13/18 1213 08/13/18 1751 08/13/18 1855  HGB 9.6*  --   --  9.3*  --   --   --   HCT 31.4*  --   --  29.8*  --   --   --   PLT 189  --   --  185  --   --   --   APTT 74*   < > 69*  --  60* 68*  --   HEPARINUNFRC 2.20*  --   --  1.27*  --   --   --   CREATININE 8.81*  --  6.74*  --   --   --   --   TROPONINI  --   --   --   --  7.08*  --  6.33*   < > = values in this interval not displayed.    Estimated Creatinine Clearance: 12.3 mL/min (A) (by C-G formula based on SCr of 6.74 mg/dL (H)).   Medical History: Past Medical History:  Diagnosis Date  . 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 Amputee, below knee, left ([place])   . Anemia   . Asthma   . Cardiomyopathy ([place])   . CHF (congestive heart failure) ([place])   . Chronic systolic heart failure ([place])   . Complication of anesthesia    hypotension  . COPD (chronic obstructive pulmonary disease) ([place])   . Coronary artery disease   . Dialysis patient ([place])    Mon, Wed, Fri  . End stage renal disease ([place])   . GERD (gastroesophageal reflux disease)   . Headache   . History of kidney stones   . History of pulmonary embolism   . HLD (hyperlipidemia)   . HTN (hypertension)   . Hyperparathyroidism   . Myocardial infarction ([place])   . Peripheral vascular disease ([place])   . Shortness of breath dyspnea   . Sleep apnea    NO C-PAP, Patient stated in process of  "getting one"   . Tobacco  dependence     Medications:  Scheduled:  . aspirin EC  81 mg Oral Daily  . atorvastatin  80 mg Oral q1800  . bisacodyl  10 mg Rectal Once  . carvedilol  6.25 mg Oral BID  . Chlorhexidine Gluconate Cloth  6 each Topical Q0600  . Chlorhexidine Gluconate Cloth  6 each Topical Q0600  . epoetin (EPOGEN/PROCRIT) injection  4,000 Units Intravenous Q M,W,F-HD  . erythromycin  1 application Right Eye QHS  . feeding supplement (NEPRO CARB STEADY)  237 mL Oral BID BM  . fluticasone  1 spray Each Nare Daily  . heparin injection (subcutaneous)  5,000 Units Subcutaneous Q12H  . isosorbide mononitrate  30 mg Oral Daily  . loratadine  10 mg Oral Daily  . mouth rinse  15 mL Mouth Rinse BID  . midodrine  10 mg Oral Q8H  . multivitamin  1 tablet Oral QHS  . mupirocin  ointment  1 application Nasal BID  . pantoprazole (PROTONIX) IV  40 mg Intravenous Q12H  . pregabalin  75 mg Oral Daily  . senna-docusate  2 tablet Oral BID  . tiotropium  18 mcg Inhalation Daily  . vitamin C  500 mg Oral BID    Assessment: Patient admitted for CP from nursing facility, trops 1.89. Patient takes eliquis 5 mg bid PTA (unclear why) and unsure when last dose was.  Patient is being started on heparin drip for UA/ACS Baseline labs show HL >3.6, hgb 7.6, PT/INR 15.9/1.3  Patient remains on pantoprazole infusion. Patient had cardiac catheterization (3/17) showing occluded proximal right coronary artery left circumflex artery and significant stenosis of distal left main and LAD at 85%.   Heparin currently infusing at 950 units/hr.   Goal of Therapy:  APTT 66 - 102 seconds Heparin level 0.3-0.7 units/ml Monitor platelets by anticoagulation protocol: Yes   Plan:  Patient is continuing to refuse labs. Per AM ICU rounds discussion, will discontinue heparin drip at this time. Will order heparin 5000 units Q12hr as patient is on dialysis.   If patient is willing to allow labs in morning, would consider resuming heparin  drip.   Pharmacy will continue to monitor and adjust per consult.   Rikayla Demmon L 08/14/2018 8:12 PM

## 2018-08-15 ENCOUNTER — Encounter: Payer: Self-pay | Admitting: Pulmonary Disease

## 2018-08-15 LAB — MAGNESIUM: Magnesium: 2.1 mg/dL (ref 1.7–2.4)

## 2018-08-15 LAB — CBC
HCT: 28.5 % — ABNORMAL LOW (ref 39.0–52.0)
Hemoglobin: 8.9 g/dL — ABNORMAL LOW (ref 13.0–17.0)
MCH: 30.3 pg (ref 26.0–34.0)
MCHC: 31.2 g/dL (ref 30.0–36.0)
MCV: 96.9 fL (ref 80.0–100.0)
PLATELETS: 203 10*3/uL (ref 150–400)
RBC: 2.94 MIL/uL — ABNORMAL LOW (ref 4.22–5.81)
RDW: 15.5 % (ref 11.5–15.5)
WBC: 9 10*3/uL (ref 4.0–10.5)
nRBC: 0 % (ref 0.0–0.2)

## 2018-08-15 LAB — BASIC METABOLIC PANEL
Anion gap: 13 (ref 5–15)
BUN: 36 mg/dL — ABNORMAL HIGH (ref 8–23)
CALCIUM: 8.8 mg/dL — AB (ref 8.9–10.3)
CO2: 27 mmol/L (ref 22–32)
CREATININE: 7.03 mg/dL — AB (ref 0.61–1.24)
Chloride: 98 mmol/L (ref 98–111)
GFR calc non Af Amer: 7 mL/min — ABNORMAL LOW (ref >60)
GFR, EST AFRICAN AMERICAN: 9 mL/min — AB (ref >60)
Glucose, Bld: 77 mg/dL (ref 70–99)
Potassium: 3.7 mmol/L (ref 3.5–5.1)
Sodium: 138 mmol/L (ref 135–145)

## 2018-08-15 LAB — APTT: aPTT: 89 seconds — ABNORMAL HIGH (ref 24–36)

## 2018-08-15 LAB — TROPONIN I
Troponin I: 4.57 ng/mL (ref <0.03)
Troponin I: 4.21 ng/mL (ref <0.03)

## 2018-08-15 LAB — HEPARIN LEVEL (UNFRACTIONATED): Heparin Unfractionated: 0.77 IU/mL — ABNORMAL HIGH (ref 0.30–0.70)

## 2018-08-15 LAB — PHOSPHORUS: Phosphorus: 4 mg/dL (ref 2.5–4.6)

## 2018-08-15 MED ORDER — ISOSORBIDE MONONITRATE ER 60 MG PO TB24
60.0000 mg | ORAL_TABLET | Freq: Every day | ORAL | Status: DC
  Administered 2018-08-16 – 2018-08-18 (×3): 60 mg via ORAL
  Filled 2018-08-15: qty 2
  Filled 2018-08-15 (×2): qty 1

## 2018-08-15 MED ORDER — RANOLAZINE ER 500 MG PO TB12
500.0000 mg | ORAL_TABLET | Freq: Two times a day (BID) | ORAL | Status: DC
  Administered 2018-08-15 – 2018-08-18 (×7): 500 mg via ORAL
  Filled 2018-08-15 (×9): qty 1

## 2018-08-15 MED ORDER — HEPARIN (PORCINE) 25000 UT/250ML-% IV SOLN
800.0000 [IU]/h | INTRAVENOUS | Status: DC
  Administered 2018-08-15 (×2): 950 [IU]/h via INTRAVENOUS
  Administered 2018-08-16: 800 [IU]/h via INTRAVENOUS
  Filled 2018-08-15 (×2): qty 250

## 2018-08-15 NOTE — Progress Notes (Signed)
[facility]  ROUNDING NOTE   Subjective:  Patient completed dialysis yesterday. Tolerated well. In good spirits this a.m.    Objective:  Vital signs in last 24 hours:  Temp:  [97.8 F (36.6 C)-98.8 F (37.1 C)] 98.3 F (36.8 C) (03/21 0800) Pulse Rate:  [78-95] 89 (03/21 1000) Resp:  [10-25] 25 (03/21 1000) BP: (89-149)/(32-100) 113/74 (03/21 1000) SpO2:  [96 %-100 %] 96 % (03/21 1000) Weight:  [92.7 kg-94.6 kg] 94.6 kg (03/21 0145)  Weight change: 0 kg Filed Weights   08/14/18 2100 08/15/18 0028 08/15/18 0145  Weight: 94.4 kg 92.7 kg 94.6 kg    Intake/Output: I/O last 3 completed shifts: In: 496.3 [P.O.:240; I.V.:256.3] Out: 1700 [Other:1700]   Intake/Output this shift:  Total I/O In: 50 [P.O.:50] Out: -   Physical Exam: General: No acute distress  Head: Normocephalic, atraumatic. Moist oral mucosal membranes  Eyes: Anicteric  Neck: Supple, trachea midline  Lungs:  Clear to auscultation, normal effort  Heart: S1S2 no rubs  Abdomen:  Soft, nontender, bowel sounds present  Extremities: Trace peripheral edema, L BKA  Neurologic: Awake, alert, following commands  Skin: No lesions  Access: LUE AVF    Basic Metabolic Panel: Recent Labs  Lab 08/11/18 0453 08/12/18 0613 08/12/18 1417 08/12/18 2249 08/14/18 1946 08/15/18 0406  NA 140 139  --  138 137 138  K 4.5 5.1  --  4.0 4.5 3.7  CL 103 100  --  99 98 98  CO2 27 27  --  30 25 27   GLUCOSE 88 110*  --  115* 92 77  BUN 39* 57*  --  36* 65* 36*  CREATININE 7.30* 8.81*  --  6.74* 10.78* 7.03*  CALCIUM 8.9 9.1  --  8.7* 9.0 8.8*  MG  --   --   --   --   --  2.1  PHOS  --  5.6* 3.5 4.5 6.2* 4.0    Liver Function Tests: Recent Labs  Lab 08/10/18 0243 08/12/18 0613 08/12/18 2249 08/14/18 1946  AST 35  --   --   --   ALT 29  --   --   --   ALKPHOS 82  --   --   --   BILITOT 0.3  --   --   --   PROT 6.6  --   --   --   ALBUMIN 3.0* 3.0* 2.7* 2.9*   Recent Labs  Lab 08/10/18 0243   LIPASE 86*   No results for input(s): AMMONIA in the last 168 hours.  CBC: Recent Labs  Lab 08/10/18 0243 08/11/18 0453 08/12/18 0613 08/13/18 0412 08/14/18 1946 08/15/18 0406  WBC 8.9 10.7* 8.9 8.2 8.9 9.0  NEUTROABS 5.5  --   --   --   --   --   HGB 7.6* 9.3* 9.6* 9.3* 8.9* 8.9*  HCT 24.2* 29.6* 31.4* 29.8* 28.9* 28.5*  MCV 100.4* 97.4 98.7 97.7 98.0 96.9  PLT 209 205 189 185 215 203    Cardiac Enzymes: Recent Labs  Lab 08/11/18 0624 08/13/18 1213 08/13/18 1855 08/14/18 1946 08/15/18 0406  TROPONINI 7.34* 7.08* 6.33* 5.29* 4.57*    BNP: Invalid input(s): POCBNP  CBG: Recent Labs  Lab 08/10/18 0644 08/12/18 1936  GLUCAP 104* 80    Microbiology: Results for orders placed or performed during the hospital encounter of 08/10/18  MRSA PCR Screening     Status: Abnormal   Collection Time: 08/10/18  6:54 AM  Result Value  Ref Range Status   MRSA by PCR POSITIVE (A) NEGATIVE Final    Comment:        The GeneXpert MRSA Assay (FDA approved for NASAL specimens only), is one component of a comprehensive MRSA colonization surveillance program. It is not intended to diagnose MRSA infection nor to guide or monitor treatment for MRSA infections. RESULT CALLED TO, READ BACK BY AND VERIFIED WITH:  [facility] F[place] AT 5573 08/10/18 KM/SDR Performed at [facility], [address] [place], [place] [address]     Coagulation Studies: No results for input(s): LABPROT, INR in the last 72 hours.  Urinalysis: No results for input(s): COLORURINE, LABSPEC, PHURINE, GLUCOSEU, HGBUR, BILIRUBINUR, KETONESUR, PROTEINUR, UROBILINOGEN, NITRITE, LEUKOCYTESUR in the last 72 hours.  Invalid input(s): APPERANCEUR    Imaging: No results found.   Medications:    . aspirin EC  81 mg Oral Daily  . atorvastatin  80 mg Oral q1800  . bisacodyl  10 mg Rectal Once  . carvedilol  6.25 mg Oral BID  . Chlorhexidine Gluconate Cloth  6 each Topical Q0600  . epoetin  (EPOGEN/PROCRIT) injection  4,000 Units Intravenous Q M,W,F-HD  . erythromycin  1 application Right Eye QHS  . feeding supplement (NEPRO CARB STEADY)  237 mL Oral BID BM  . fluticasone  1 spray Each Nare Daily  . heparin injection (subcutaneous)  5,000 Units Subcutaneous Q12H  . isosorbide mononitrate  30 mg Oral Daily  . loratadine  10 mg Oral Daily  . mouth rinse  15 mL Mouth Rinse BID  . midodrine  10 mg Oral Q8H  . multivitamin  1 tablet Oral QHS  . pantoprazole (PROTONIX) IV  40 mg Intravenous Q12H  . pregabalin  75 mg Oral Daily  . senna-docusate  2 tablet Oral BID  . tiotropium  18 mcg Inhalation Daily  . vitamin C  500 mg Oral BID   acetaminophen, albuterol, guaiFENesin, ipratropium-albuterol, morphine injection, nitroGLYCERIN, ondansetron (ZOFRAN) IV, polyethylene glycol, traMADol, zolpidem  Assessment/ Plan:  63 y.o. male with ESRD on hemodialysis, hypertension, hyperlipidemia, anemia, COPD, peripheral vascular disease, left BKA, right toe amputations  MWF [facility].   1.  End-stage renal disease 2.  Chest pain. 3.  Anemia of chronic kidney disease 4.  Chronic hypotension 5.  Secondary hyperparathyroidism  Plan: Patient completed hemodialysis yesterday.  Tolerated well.  No acute indication for dialysis today.  Troponin trending down and currently 4.57.  Maintain the patient on Epogen for treatment of anemia of chronic kidney disease.  Most recent hemoglobin 8.9.  Phosphorus under good control at 4.0.  Neck scheduled dialysis for Monday.   LOS: 5 Jonnelle Lawniczak 3/21/202010:31 AM

## 2018-08-15 NOTE — Progress Notes (Addendum)
A & O X  4.  Denies pain and SOB at this time. On 4L [place]

## 2018-08-15 NOTE — Progress Notes (Signed)
HD Tx End   08/15/18 0028  Hand-Off documentation  Report given to (Full Name) Manuella Ghazi RN  Report received from (Full Name) Trellis Paganini RN  Vital Signs  Temp Source Oral  Pulse Rate 86  Pulse Rate Source Monitor  Resp 14  BP Location Right Arm  BP Method Automatic  Patient Position (if appropriate) Lying  Oxygen Therapy  SpO2 100 %  O2 Device Nasal Cannula  O2 Flow Rate (L/min) 4 L/min  Pain Assessment  Pain Scale 0-10  Pain Score 0  Dialysis Weight  Weight 92.7 kg  Type of Weight Post-Dialysis  During Hemodialysis Assessment  Blood Flow Rate (mL/min) 200 mL/min  Arterial Pressure (mmHg) -80 mmHg  Venous Pressure (mmHg) 110 mmHg  Transmembrane Pressure (mmHg) 50 mmHg  Ultrafiltration Rate (mL/min) 660 mL/min  Dialysate Flow Rate (mL/min) 800 ml/min  Conductivity: Machine  13.8  HD Safety Checks Performed Yes  KECN 56.5 KECN  Dialysis Fluid Bolus Normal Saline  Bolus Amount (mL) 250 mL  Intra-Hemodialysis Comments Tx completed  Fistula / Graft Left Upper arm Arteriovenous fistula  No Placement Date or Time found.   Placed prior to admission: Yes  Orientation: Left  Access Location: Upper arm  Access Type: Arteriovenous fistula  Site Condition No complications  Fistula / Graft Assessment Present;Thrill;Bruit  Status Deaccessed  Drainage Description None

## 2018-08-15 NOTE — Progress Notes (Signed)
Complains of  dull chest pain and dull pain down his entire left arm. States a pain level of 2 on 0-10 pain scale. Phyisian notified,  see MAR.

## 2018-08-15 NOTE — Progress Notes (Signed)
[place] for heparin Indication: chest pain/ACS  Allergies  Allergen Reactions  . Dust Mite Extract Other (See Comments)    Reaction: unknown    Patient Measurements: Height: 5\' 8"  (172.7 cm) Weight: 208 lb 8.9 oz (94.6 kg) IBW/kg (Calculated) : 68.4 Heparin Dosing Weight: 90.4 kg  Vital Signs: Temp: 99 F (37.2 C) (03/21 1900) Temp Source: Oral (03/21 1900) BP: 119/71 (03/21 2000) Pulse Rate: 87 (03/21 2100)  Labs: Recent Labs    08/12/18 2249  08/13/18 0412  08/13/18 1751  08/14/18 1946 08/15/18 0406 08/15/18 0954 08/15/18 1929  HGB  --    < > 9.3*  --   --   --  8.9* 8.9*  --   --   HCT  --   --  29.8*  --   --   --  28.9* 28.5*  --   --   PLT  --   --  185  --   --   --  215 203  --   --   APTT 69*  --   --    < > 68*  --  81*  --   --  89*  HEPARINUNFRC  --   --  1.27*  --   --   --  1.10*  --   --  0.77*  CREATININE 6.74*  --   --   --   --   --  10.78* 7.03*  --   --   TROPONINI  --   --   --    < >  --    < > 5.29* 4.57* 4.21*  --    < > = values in this interval not displayed.    Estimated Creatinine Clearance: 11.8 mL/min (A) (by C-G formula based on SCr of 7.03 mg/dL (H)).   Medical History: Past Medical History:  Diagnosis Date  . 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 Amputee, below knee, left ([place])   . Anemia   . Asthma   . Cardiomyopathy ([place])   . CHF (congestive heart failure) ([place])   . Chronic systolic heart failure ([place])   . Complication of anesthesia    hypotension  . COPD (chronic obstructive pulmonary disease) ([place])   . Coronary artery disease   . Dialysis patient ([place])    Mon, Wed, Fri  . End stage renal disease ([place])   . GERD (gastroesophageal reflux disease)   . Headache   . History of kidney stones   . History of pulmonary embolism   . HLD (hyperlipidemia)   . HTN (hypertension)   . Hyperparathyroidism   . Myocardial infarction ([place])   . Peripheral vascular disease ([place])    . Shortness of breath dyspnea   . Sleep apnea    NO C-PAP, Patient stated in process of  "getting one"   . Tobacco dependence     Medications:  Scheduled:  . aspirin EC  81 mg Oral Daily  . atorvastatin  80 mg Oral q1800  . bisacodyl  10 mg Rectal Once  . carvedilol  6.25 mg Oral BID  . Chlorhexidine Gluconate Cloth  6 each Topical Q0600  . epoetin (EPOGEN/PROCRIT) injection  4,000 Units Intravenous Q M,W,F-HD  . erythromycin  1 application Right Eye QHS  . feeding supplement (NEPRO CARB STEADY)  237 mL Oral BID BM  . fluticasone  1 spray Each Nare Daily  . [START ON 08/16/2018] isosorbide mononitrate  60 mg Oral Daily  . loratadine  10 mg Oral Daily  . mouth rinse  15 mL Mouth Rinse BID  . midodrine  10 mg Oral Q8H  . multivitamin  1 tablet Oral QHS  . pantoprazole (PROTONIX) IV  40 mg Intravenous Q12H  . pregabalin  75 mg Oral Daily  . ranolazine  500 mg Oral BID  . senna-docusate  2 tablet Oral BID  . tiotropium  18 mcg Inhalation Daily  . vitamin C  500 mg Oral BID    Assessment: Patient admitted for CP from nursing facility, trops 1.89. Patient takes eliquis 5 mg bid PTA (unclear why) and unsure when last dose was.  Patient is being started on heparin drip for UA/ACS Baseline labs show HL >3.6, hgb 7.6, PT/INR 15.9/1.3  Patient remains on pantoprazole infusion. Patient had cardiac catheterization (3/17) showing occluded proximal right coronary artery left circumflex artery and significant stenosis of distal left main and LAD at 85%.   Heparin currently infusing at 950 units/hr.   3/20-Patient is continuing to refuse labs. Per AM ICU rounds discussion, will discontinue heparin drip at this time. Will order heparin 5000 units Q12hr as patient is on dialysis.   If patient is willing to allow labs in morning, would consider resuming heparin drip.   Goal of Therapy:  APTT 66 - 102 seconds Heparin level 0.3-0.7 units/ml Monitor platelets by anticoagulation protocol:  Yes   Plan:  3/21- labs drawn 3/20 pm (dialysis) and am 3/21. Messaged ICU MD about whether to restart Heparin drip. Will restart drip per MD, at previous rate of 950 units/hr. Will discontinue Heparin subQ order. hgb 8.9  Plt 203. Will check HL/aptt in 8 hours.  3/21 @ 1929 :  HL = 0.77 ,  APTT = 89 Will continue this pt at current rate and recheck HL and aPTT on 3/22 @ 0500.   Pharmacy will continue to monitor and adjust per consult.   Bayla Mcgovern D 08/15/2018 9:29 PM

## 2018-08-15 NOTE — Progress Notes (Signed)
CRITICAL CARE NOTE        SUBJECTIVE FINDINGS & SIGNIFICANT EVENTS   Patient remains critically ill. Prognosis is guarded Goals of care discussed , wishes to remain full code.  Episode of chest pain today-required IV morphine Will transition to SDU followed by floor status  PAST MEDICAL HISTORY   Past Medical History:  Diagnosis Date  . 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 Amputee, below knee, left ([place])   . Anemia   . Asthma   . Cardiomyopathy ([place])   . CHF (congestive heart failure) ([place])   . Chronic systolic heart failure ([place])   . Complication of anesthesia    hypotension  . COPD (chronic obstructive pulmonary disease) ([place])   . Coronary artery disease   . Dialysis patient ([place])    Mon, Wed, Fri  . End stage renal disease ([place])   . GERD (gastroesophageal reflux disease)   . Headache   . History of kidney stones   . History of pulmonary embolism   . HLD (hyperlipidemia)   . HTN (hypertension)   . Hyperparathyroidism   . Myocardial infarction ([place])   . Peripheral vascular disease ([place])   . Shortness of breath dyspnea   . Sleep apnea    NO C-PAP, Patient stated in process of  "getting one"   . Tobacco dependence      SURGICAL HISTORY   Past Surgical History:  Procedure Laterality Date  . A/V FISTULAGRAM Left 08/14/2017   Procedure: A/V FISTULAGRAM;  Surgeon: Algernon Huxley, MD;  Location: [facility];  Service: Cardiovascular;  Laterality: Left;  . AMPUTATION Left 05/06/2014   Procedure: AMPUTATION BELOW KNEE;  Surgeon: Elam Dutch, MD;  Location: [place] [facility];  Service: Vascular;  Laterality: Left;  . AMPUTATION Right 01/12/2015   Procedure: Foot transmetatarsal amputation;  Surgeon: Algernon Huxley, MD;  Location: [facility];  Service: Vascular;  Laterality: Right;  .  APPLICATION OF WOUND VAC Right 03/01/2015   Procedure: Application of Bio-connekt graft and wound vac application to right foot ;  Surgeon: Algernon Huxley, MD;  Location: [facility];  Service: Vascular;  Laterality: Right;  . AV FISTULA PLACEMENT Left   . AV FISTULA PLACEMENT Left 11/28/2016   Procedure: ARTERIOVENOUS (AV) FISTULA CREATION;  Surgeon: Algernon Huxley, MD;  Location: [facility];  Service: Vascular;  Laterality: Left;  . CARDIAC CATHETERIZATION     stent placement   . CORONARY ANGIOPLASTY    . DIALYSIS/PERMA CATHETER INSERTION N/A 07/31/2016   Procedure: Dialysis/Perma Catheter Insertion;  Surgeon: Algernon Huxley, MD;  Location: [facility] [facility];  Service: Cardiovascular;  Laterality: N/A;  . DIALYSIS/PERMA CATHETER INSERTION N/A 07/25/2017   Procedure: DIALYSIS/PERMA CATHETER INSERTION and fistulagram;  Surgeon: Algernon Huxley, MD;  Location: [facility];  Service: Cardiovascular;  Laterality: N/A;  . DIALYSIS/PERMA CATHETER REMOVAL N/A 07/22/2017   Procedure: DIALYSIS/PERMA CATHETER REMOVAL;  Surgeon: Katha Cabal, MD;  Location: [facility];  Service: Cardiovascular;  Laterality: N/A;  . DIALYSIS/PERMA CATHETER REMOVAL N/A 02/05/2018   Procedure: DIALYSIS/PERMA CATHETER REMOVAL;  Surgeon: Algernon Huxley, MD;  Location: [facility];  Service: Cardiovascular;  Laterality: N/A;  . IR FLUORO GUIDE CV LINE LEFT  04/10/2017  . LEFT HEART CATH AND CORONARY ANGIOGRAPHY N/A 08/11/2018   Procedure: LEFT HEART CATH AND CORONARY ANGIOGRAPHY;  Surgeon: Corey Skains, MD;  Location: [facility];  Service: Cardiovascular;  Laterality:  N/A;  . LIGATION OF ARTERIOVENOUS  FISTULA Right 01/31/2016   Procedure: LIGATION OF ARTERIOVENOUS  FISTULA;  Surgeon: Algernon Huxley, MD;  Location: [facility];  Service: Vascular;  Laterality: Right;  . PERIPHERAL VASCULAR CATHETERIZATION Right 12/15/2014   Procedure: Lower Extremity Angiography;  Surgeon: Algernon Huxley, MD;  Location: [facility];  Service: Cardiovascular;  Laterality: Right;  . PERIPHERAL VASCULAR CATHETERIZATION  12/15/2014   Procedure: Lower Extremity Intervention;  Surgeon: Algernon Huxley, MD;  Location: [facility];  Service: Cardiovascular;;  . PERIPHERAL VASCULAR CATHETERIZATION Right 08/14/2015   Procedure: A/V Shuntogram/Fistulagram;  Surgeon: Algernon Huxley, MD;  Location: [place] [facility];  Service: Cardiovascular;  Laterality: Right;  . PERIPHERAL VASCULAR CATHETERIZATION N/A 08/14/2015   Procedure: A/V Shunt Intervention;  Surgeon: Algernon Huxley, MD;  Location: [facility];  Service: Cardiovascular;  Laterality: N/A;  . PERIPHERAL VASCULAR CATHETERIZATION N/A 01/11/2016   Procedure: Dialysis/Perma Catheter Insertion;  Surgeon: Algernon Huxley, MD;  Location: [facility];  Service: Cardiovascular;  Laterality: N/A;  . REVISON OF ARTERIOVENOUS FISTULA Right 02/17/2016   Procedure: removal of AV fistula;  Surgeon: Serafina Mitchell, MD;  Location: [facility];  Service: Vascular;  Laterality: Right;  . REVISON OF ARTERIOVENOUS FISTULA Right 01/31/2016   Procedure: REVISON OF ARTERIOVENOUS FISTULA ( BRACHIOCEPHALIC ) W/ ARTEGRAFT;  Surgeon: Algernon Huxley, MD;  Location: [facility];  Service: Vascular;  Laterality: Right;  . TRANSMETATARSAL AMPUTATION Right 05/04/2015   Procedure: TRANSMETATARSAL AMPUTATION REVISION, great toe amputation;  Surgeon: Algernon Huxley, MD;  Location: [facility];  Service: Vascular;  Laterality: Right;     FAMILY HISTORY   Family History  Problem Relation Age of Onset  . Leukemia Mother   . Heart attack Father   . Heart failure Other   . Hypertension Other   . Leukemia Other   . Diabetes Other   . Prostate cancer Neg Hx   . Kidney cancer Neg Hx   . Bladder Cancer Neg Hx      SOCIAL HISTORY   Social History   Tobacco Use  . Smoking status: Light Tobacco Smoker    Packs/day: 0.25    Years: 30.00    Pack years: 7.50    Types: Cigarettes  . Smokeless  tobacco: Never Used  . Tobacco comment: 5  Substance Use Topics  . Alcohol use: No    Alcohol/week: 0.0 standard drinks  . Drug use: No    Comment: has used crack cocaine in past      MEDICATIONS   Current Medication:  Current Facility-Administered Medications:  .  acetaminophen (TYLENOL) tablet 650 mg, 650 mg, Oral, Q4H PRN, Mansy, Jan A, MD .  albuterol (PROVENTIL) (2.5 MG/3ML) 0.083% nebulizer solution 2.5 mg, 2.5 mg, Inhalation, Q6H PRN, Mansy, Jan A, MD .  aspirin EC tablet 81 mg, 81 mg, Oral, Daily, Mansy, Jan A, MD, 81 mg at 08/15/18 0934 .  atorvastatin (LIPITOR) tablet 80 mg, 80 mg, Oral, q1800, Mansy, Jan A, MD, 80 mg at 08/14/18 1742 .  bisacodyl (DULCOLAX) suppository 10 mg, 10 mg, Rectal, Once, Hoda Hon, MD .  carvedilol (COREG) tablet 6.25 mg, 6.25 mg, Oral, BID, Mansy, Jan A, MD, 6.25 mg at 08/13/18 1043 .  Chlorhexidine Gluconate Cloth 2 % PADS 6 each, 6 each, Topical, Q0600, Murlean Iba, MD, 6 each at 08/15/18 0200 .  epoetin alfa (EPOGEN,PROCRIT) injection 4,000 Units, 4,000 Units, Intravenous, Q M,W,F-HD, Lateef, Munsoor,  MD, 4,000 Units at 08/15/18 0041 .  erythromycin ophthalmic ointment 1 application, 1 application, Right Eye, QHS, Mansy, Jan A, MD .  feeding supplement (NEPRO CARB STEADY) liquid 237 mL, 237 mL, Oral, BID BM, Lateef, Munsoor, MD, 237 mL at 08/15/18 1344 .  fluticasone (FLONASE) 50 MCG/ACT nasal spray 1 spray, 1 spray, Each Nare, Daily, Mansy, Jan A, MD, 1 spray at 08/13/18 1050 .  guaiFENesin (ROBITUSSIN) 100 MG/5ML solution 200 mg, 200 mg, Oral, TID PRN, Mansy, Jan A, MD .  heparin ADULT infusion 100 units/mL (25000 units/225mL sodium chloride 0.45%), 950 Units/hr, Intravenous, Continuous, Andres Vest, MD, Last Rate: 9.5 mL/hr at 08/15/18 1132, 950 Units/hr at 08/15/18 1132 .  ipratropium-albuterol (DUONEB) 0.5-2.5 (3) MG/3ML nebulizer solution 3 mL, 3 mL, Inhalation, Q6H PRN, Mansy, Jan A, MD .  isosorbide mononitrate (IMDUR) 24 hr  tablet 30 mg, 30 mg, Oral, Daily, Mansy, Jan A, MD, 30 mg at 08/15/18 0934 .  loratadine (CLARITIN) tablet 10 mg, 10 mg, Oral, Daily, Mansy, Jan A, MD, 10 mg at 08/15/18 0934 .  MEDLINE mouth rinse, 15 mL, Mouth Rinse, BID, Irais Mottram, MD, 15 mL at 08/15/18 0937 .  midodrine (PROAMATINE) tablet 10 mg, 10 mg, Oral, Q8H, Mansy, Jan A, MD, 10 mg at 08/15/18 1344 .  morphine 2 MG/ML injection 2 mg, 2 mg, Intravenous, Q4H PRN, Darel Hong D, NP, 2 mg at 08/15/18 1024 .  multivitamin (RENA-VIT) tablet 1 tablet, 1 tablet, Oral, QHS, Charlett Nose, RPH, 1 tablet at 08/13/18 2125 .  nitroGLYCERIN (NITROSTAT) SL tablet 0.4 mg, 0.4 mg, Sublingual, Q5 min PRN, Darel Hong D, NP, 0.4 mg at 08/15/18 3212 .  ondansetron (ZOFRAN) injection 4 mg, 4 mg, Intravenous, Q6H PRN, Mansy, Jan A, MD .  pantoprazole (PROTONIX) injection 40 mg, 40 mg, Intravenous, Q12H, Ottie Glazier, MD, 40 mg at 08/15/18 0936 .  polyethylene glycol (MIRALAX / GLYCOLAX) packet 17 g, 17 g, Oral, Daily PRN, Mansy, Jan A, MD .  pregabalin (LYRICA) capsule 75 mg, 75 mg, Oral, Daily, Mansy, Jan A, MD, 75 mg at 08/15/18 0934 .  senna-docusate (Senokot-S) tablet 2 tablet, 2 tablet, Oral, BID, Ottie Glazier, MD, 2 tablet at 08/15/18 0934 .  tiotropium (SPIRIVA) inhalation capsule ([facility] use ONLY) 18 mcg, 18 mcg, Inhalation, Daily, Mansy, Jan A, MD, 18 mcg at 08/15/18 0936 .  traMADol (ULTRAM) tablet 75 mg, 75 mg, Oral, Q8H PRN, Mansy, Jan A, MD, 75 mg at 08/11/18 0530 .  vitamin C (ASCORBIC ACID) tablet 500 mg, 500 mg, Oral, BID, Charlett Nose, RPH, 500 mg at 08/15/18 2482 .  zolpidem (AMBIEN) tablet 5 mg, 5 mg, Oral, QHS PRN,MR X 1, Mansy, Jan A, MD, 5 mg at 08/11/18 2257    ALLERGIES   Dust mite extract    REVIEW OF SYSTEMS    System ROS conducted and is negative except as per subjective findings  PHYSICAL EXAMINATION   Vitals:   08/15/18 1200 08/15/18 1300  BP: (!) 108/43 114/68  Pulse: 88 91  Resp: 12  16  Temp:    SpO2: 99% 99%    GENERAL: No apparent distress HEAD: Normocephalic, atraumatic.  EYES: Pupils equal, round, reactive to light.  No scleral icterus.  MOUTH: Moist mucosal membrane. NECK: Supple. No thyromegaly. No nodules. No JVD.  PULMONARY: There to auscultation bilaterally CARDIOVASCULAR: S1 and S2. Regular rate and rhythm. No murmurs, rubs, or gallops.  GASTROINTESTINAL: Soft, nontender, non-distended. No masses. Positive bowel sounds. No hepatosplenomegaly.  MUSCULOSKELETAL: No  swelling, clubbing, or edema.  NEUROLOGIC: Mild distress due to acute illness SKIN:intact,warm,dry   LABS AND IMAGING       LAB RESULTS: Recent Labs  Lab 08/12/18 2249 08/14/18 1946 08/15/18 0406  NA 138 137 138  K 4.0 4.5 3.7  CL 99 98 98  CO2 30 25 27   BUN 36* 65* 36*  CREATININE 6.74* 10.78* 7.03*  GLUCOSE 115* 92 77   Recent Labs  Lab 08/13/18 0412 08/14/18 1946 08/15/18 0406  HGB 9.3* 8.9* 8.9*  HCT 29.8* 28.9* 28.5*  WBC 8.2 8.9 9.0  PLT 185 215 203     IMAGING RESULTS: No results found.    ASSESSMENT AND PLAN       Multidisciplinary rounds held today    ACS -NSTEMI - Cardiology on case - appreciate input-maximizing medical therapy-Ranexa 500bid, Imdur 60 daily -Status post left heart cath-   Mid RCA lesion is 100% stenosed.  Ost Cx to Prox Cx lesion is 100% stenosed.  Mid LM lesion is 85% stenosed.          Prox LAD lesion is 85% stenosed. -follow up cardiac enzymes as indicated ICU monitoring   Acute blood loss anemia -resolved - Hematochezia - likely related to oral anticoagulant agent-resolved - H/H trending -PRBC transfusion with goal Hb>8 due to active CAD - GI evaluation-poss EGD/Cscope post cardio clearance -abd distension with constipation -s/p bowel regimen    AcuteHypoxic Respiratory Failure -continue supportive measures - [place] O2 goal O2 saturation 90%-noncompliant with cpap - currenlty on 4Lnc -continue  Bronchodilator Therapy     Renal Failure-CKD4 -Nephrology on case - appreciate input -for HD in am   ID -continue IV abx as prescibed-nasal MRSA + -follow up cultures   GI/Nutrition GI PROPHYLAXIS as indicated DIET-->TF's as tolerated Constipation protocol as indicated   ENDO - ICU hypoglycemic\Hyperglycemia protocol -check FSBS per protocol   ELECTROLYTES -follow labs as needed -replace as needed -pharmacy consultation   DVT/GI PRX ordered -SCDs  TRANSFUSIONS AS NEEDED MONITOR FSBS ASSESS the need for LABS as needed   Critical care provider statement:  Critical care time (minutes):38 Critical care time was exclusive of: Separately billable procedures and treating other patients Critical care was necessary to treat or prevent imminent or life-threatening deterioration of the following conditions:ACS, LGIB, hypoxemia, DM comorbid Critical care was time spent personally by me on the following activities: Development of treatment plan with patient or surrogate, discussions with consultants, evaluation of patient's response to treatment, examination of patient, obtaining history from patient or surrogate, ordering and performing treatments and interventions, ordering and review of laboratory studies and re-evaluation of patient's condition. I assumed direction of critical care for this patient from another provider in my specialty: no  This document was prepared using Dragon voice recognition software and may include unintentional dictation errors.      Ottie Glazier, M.D.  Division of [place]

## 2018-08-15 NOTE — Progress Notes (Signed)
Post HD Assessment  1.7 liter fluid removal during HD.     08/15/18 0057  Neurological  Level of Consciousness Alert  Orientation Level Oriented X4  Respiratory  Respiratory Pattern Regular  Chest Assessment Chest expansion symmetrical  Bilateral Breath Sounds Diminished  Cough Non-productive  Cardiac  Pulse Regular  Heart Sounds S1, S2  Jugular Venous Distention (JVD) No  ECG Monitor Yes  Cardiac Rhythm NSR  Vascular  R Radial Pulse +2  L Radial Pulse +2  Integumentary  Integumentary (WDL) X  Skin Color Appropriate for ethnicity  Skin Condition Dry;Flaky  Musculoskeletal  Musculoskeletal (WDL) X  Generalized Weakness Yes  GU Assessment  Genitourinary (WDL) X  Genitourinary Symptoms Anuria (HD Pt)  Psychosocial  Psychosocial (WDL) WDL  Patient Behaviors  (pt conversing w RN, pleasant and cooperative)  Needs Expressed  (reports desire to be more mobile/independant/dislikes bedpan)  Emotional support given Given to patient

## 2018-08-15 NOTE — Progress Notes (Signed)
Subjective:  Resting comfortably in bed denies any significant pain today still has some shortness of breath still getting nephrology and dialysis treatment.  No worsening shortness of breath  Objective:  Vital Signs in the last 24 hours: Temp:  [97.8 F (36.6 C)-98.8 F (37.1 C)] 97.8 F (36.6 C) (03/21 0200) Pulse Rate:  [76-94] 82 (03/21 0630) Resp:  [10-22] 14 (03/21 0630) BP: (89-149)/(32-100) 96/64 (03/21 0630) SpO2:  [95 %-100 %] 100 % (03/21 0630) Weight:  [92.7 kg-94.6 kg] 94.6 kg (03/21 0145)  Intake/Output from previous day: 03/20 0701 - 03/21 0700 In: 392.9 [P.O.:240; I.V.:152.9] Out: 1700  Intake/Output from this shift: Total I/O In: 19.4 [I.V.:19.4] Out: 1700 [Other:1700]  Physical Exam: General appearance: appears older than stated age Neck: no adenopathy, no carotid bruit, no JVD, supple, symmetrical, trachea midline and thyroid not enlarged, symmetric, no tenderness/mass/nodules Lungs: diminished breath sounds bibasilar and bilaterally Heart: irregularly irregular rhythm Abdomen: soft, non-tender; bowel sounds normal; no masses,  no organomegaly Extremities: venous stasis dermatitis noted and Significant peripheral vascular disease with previous amputation Pulses: 2+ and symmetric Pulses reduced throughout Skin: Skin color, texture, turgor normal. No rashes or lesions Neurologic: Alert and oriented X 3, normal strength and tone. Normal symmetric reflexes. Normal coordination and gait  Lab Results: Recent Labs    08/14/18 1946 08/15/18 0406  WBC 8.9 9.0  HGB 8.9* 8.9*  PLT 215 203   Recent Labs    08/14/18 1946 08/15/18 0406  NA 137 138  K 4.5 3.7  CL 98 98  CO2 25 27  GLUCOSE 92 77  BUN 65* 36*  CREATININE 10.78* 7.03*   Recent Labs    08/14/18 1946 08/15/18 0406  TROPONINI 5.29* 4.57*   Hepatic Function Panel Recent Labs    08/14/18 1946  ALBUMIN 2.9*   No results for input(s): CHOL in the last 72 hours. No results for input(s):  PROTIME in the last 72 hours.  Imaging: Imaging results have been reviewed  Cardiac Studies:  Assessment/Plan:  History of non-STEMI Angina Cardiomyopathy Chest Pain CHF Coronary Artery Disease Edema Ischemic Heart Disease Shortness of Breath Vascular Disease  Multivessel coronary disease Anemia Stage renal disease on dialysis . Plan Agree with ICU level care Consider  transferring the patient to telemetry We will try to switch off of IV heparin and use aspirin Plavix and statin Statin therapy should be maintained 3 with nephrology input and dialysis as necessary Mental oxygen inhalers as needed Continue vascular input Recommend Imdur Ranexa beta-blockers as tolerated Increase Imdur to 60 mg once a day Start Ranexa 500 mg twice a day Consider ACE inhibitor if okay with nephrology If not we will start hydralazine in conjunction with Imdur for heart failure therapy I do not recommend an invasive strategy for this patient and recommend aggressive medical therapy He is to high risk for coronary bypass surgery or high risk intervention at this point because of underlying cardiac status and multiple comorbid diseases Hopefully we will try to treat aggressively medically and stabilized and reassess at a much later date Case discussed with ICU attending   LOS: 5 days    Lorean Ekstrand D Johntavius Shepard 08/15/2018, 6:48 AM

## 2018-08-15 NOTE — Progress Notes (Signed)
Pre HD Assessment   08/14/18 2105  Neurological  Level of Consciousness Alert  Orientation Level Oriented X4  Respiratory  Respiratory Pattern Regular  Chest Assessment Chest expansion symmetrical  Bilateral Breath Sounds Diminished  Cough Non-productive  Cardiac  Pulse Regular  Heart Sounds S1, S2  Jugular Venous Distention (JVD) No  ECG Monitor Yes  Cardiac Rhythm NSR  Vascular  R Radial Pulse +2  L Radial Pulse +2  Integumentary  Integumentary (WDL) X  Skin Color Appropriate for ethnicity  Skin Condition Dry;Flaky  Musculoskeletal  Musculoskeletal (WDL) X  Generalized Weakness Yes  GU Assessment  Genitourinary (WDL) X  Genitourinary Symptoms Anuria (HD Pt)  Psychosocial  Psychosocial (WDL) WDL  Patient Behaviors  (pt conversing w RN, pleasant and cooperative)  Needs Expressed  (reports desire to be more mobile/independant/dislikes bedpan)  Emotional support given Given to patient

## 2018-08-15 NOTE — Progress Notes (Signed)
[place] for heparin Indication: chest pain/ACS  Allergies  Allergen Reactions  . Dust Mite Extract Other (See Comments)    Reaction: unknown    Patient Measurements: Height: 5\' 8"  (172.7 cm) Weight: 208 lb 8.9 oz (94.6 kg) IBW/kg (Calculated) : 68.4 Heparin Dosing Weight: 90.4 kg  Vital Signs: Temp: 98.3 F (36.8 C) (03/21 0800) Temp Source: Axillary (03/21 0800) BP: 113/74 (03/21 1000) Pulse Rate: 89 (03/21 1000)  Labs: Recent Labs    08/12/18 2249  08/13/18 0412 08/13/18 1213 08/13/18 1751  08/14/18 1946 08/15/18 0406 08/15/18 0954  HGB  --    < > 9.3*  --   --   --  8.9* 8.9*  --   HCT  --   --  29.8*  --   --   --  28.9* 28.5*  --   PLT  --   --  185  --   --   --  215 203  --   APTT 69*  --   --  60* 68*  --  81*  --   --   HEPARINUNFRC  --   --  1.27*  --   --   --  1.10*  --   --   CREATININE 6.74*  --   --   --   --   --  10.78* 7.03*  --   TROPONINI  --   --   --  7.08*  --    < > 5.29* 4.57* 4.21*   < > = values in this interval not displayed.    Estimated Creatinine Clearance: 11.8 mL/min (A) (by C-G formula based on SCr of 7.03 mg/dL (H)).   Medical History: Past Medical History:  Diagnosis Date  . 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 Amputee, below knee, left ([place])   . Anemia   . Asthma   . Cardiomyopathy ([place])   . CHF (congestive heart failure) ([place])   . Chronic systolic heart failure ([place])   . Complication of anesthesia    hypotension  . COPD (chronic obstructive pulmonary disease) ([place])   . Coronary artery disease   . Dialysis patient ([place])    Mon, Wed, Fri  . End stage renal disease ([place])   . GERD (gastroesophageal reflux disease)   . Headache   . History of kidney stones   . History of pulmonary embolism   . HLD (hyperlipidemia)   . HTN (hypertension)   . Hyperparathyroidism   . Myocardial infarction ([place])   . Peripheral vascular disease ([place])   . Shortness of breath  dyspnea   . Sleep apnea    NO C-PAP, Patient stated in process of  "getting one"   . Tobacco dependence     Medications:  Scheduled:  . aspirin EC  81 mg Oral Daily  . atorvastatin  80 mg Oral q1800  . bisacodyl  10 mg Rectal Once  . carvedilol  6.25 mg Oral BID  . Chlorhexidine Gluconate Cloth  6 each Topical Q0600  . epoetin (EPOGEN/PROCRIT) injection  4,000 Units Intravenous Q M,W,F-HD  . erythromycin  1 application Right Eye QHS  . feeding supplement (NEPRO CARB STEADY)  237 mL Oral BID BM  . fluticasone  1 spray Each Nare Daily  . heparin injection (subcutaneous)  5,000 Units Subcutaneous Q12H  . isosorbide mononitrate  30 mg Oral Daily  . loratadine  10 mg Oral Daily  . mouth rinse  15 mL Mouth Rinse BID  . midodrine  10 mg Oral Q8H  . multivitamin  1 tablet Oral QHS  . pantoprazole (PROTONIX) IV  40 mg Intravenous Q12H  . pregabalin  75 mg Oral Daily  . senna-docusate  2 tablet Oral BID  . tiotropium  18 mcg Inhalation Daily  . vitamin C  500 mg Oral BID    Assessment: Patient admitted for CP from nursing facility, trops 1.89. Patient takes eliquis 5 mg bid PTA (unclear why) and unsure when last dose was.  Patient is being started on heparin drip for UA/ACS Baseline labs show HL >3.6, hgb 7.6, PT/INR 15.9/1.3  Patient remains on pantoprazole infusion. Patient had cardiac catheterization (3/17) showing occluded proximal right coronary artery left circumflex artery and significant stenosis of distal left main and LAD at 85%.   Heparin currently infusing at 950 units/hr.   3/20-Patient is continuing to refuse labs. Per AM ICU rounds discussion, will discontinue heparin drip at this time. Will order heparin 5000 units Q12hr as patient is on dialysis.   If patient is willing to allow labs in morning, would consider resuming heparin drip.   Goal of Therapy:  APTT 66 - 102 seconds Heparin level 0.3-0.7 units/ml Monitor platelets by anticoagulation protocol: Yes   Plan:   3/21- labs drawn 3/20 pm (dialysis) and am 3/21. Messaged ICU MD about whether to restart Heparin drip. Will restart drip per MD, at previous rate of 950 units/hr. Will discontinue Heparin subQ order. hgb 8.9  Plt 203. Will check HL/aptt in 8 hours.  Pharmacy will continue to monitor and adjust per consult.   Manuella Blackson A 08/15/2018 11:08 AM

## 2018-08-15 NOTE — Progress Notes (Signed)
Post HD Tx   08/15/18 0030  Post-Hemodialysis Assessment  Rinseback Volume (mL) 250 mL  KECN 56.5 V  Dialyzer Clearance Lightly streaked  Duration of HD Treatment -hour(s) 3.5 hour(s)  Hemodialysis Intake (mL) 500 mL  UF Total -Machine (mL) 2200 mL  Net UF (mL) 1700 mL  Tolerated HD Treatment Yes  Post-Hemodialysis Comments pt stable  AVG/AVF Arterial Site Held (minutes) 10 minutes  AVG/AVF Venous Site Held (minutes) 10 minutes

## 2018-08-16 LAB — MAGNESIUM: Magnesium: 2.4 mg/dL (ref 1.7–2.4)

## 2018-08-16 LAB — BASIC METABOLIC PANEL
Anion gap: 11 (ref 5–15)
BUN: 50 mg/dL — ABNORMAL HIGH (ref 8–23)
CALCIUM: 9.3 mg/dL (ref 8.9–10.3)
CO2: 26 mmol/L (ref 22–32)
Chloride: 101 mmol/L (ref 98–111)
Creatinine, Ser: 9.31 mg/dL — ABNORMAL HIGH (ref 0.61–1.24)
GFR calc Af Amer: 6 mL/min — ABNORMAL LOW (ref >60)
GFR calc non Af Amer: 5 mL/min — ABNORMAL LOW (ref >60)
Glucose, Bld: 122 mg/dL — ABNORMAL HIGH (ref 70–99)
Potassium: 3.8 mmol/L (ref 3.5–5.1)
Sodium: 138 mmol/L (ref 135–145)

## 2018-08-16 LAB — HEPARIN LEVEL (UNFRACTIONATED)
Heparin Unfractionated: 0.83 IU/mL — ABNORMAL HIGH (ref 0.30–0.70)
Heparin Unfractionated: 0.68 IU/mL (ref 0.30–0.70)
Heparin Unfractionated: 0.48 IU/mL (ref 0.30–0.70)

## 2018-08-16 LAB — CBC
HEMATOCRIT: 27.7 % — AB (ref 39.0–52.0)
HEMOGLOBIN: 8.9 g/dL — AB (ref 13.0–17.0)
MCH: 30.7 pg (ref 26.0–34.0)
MCHC: 32.1 g/dL (ref 30.0–36.0)
MCV: 95.5 fL (ref 80.0–100.0)
Platelets: 184 10*3/uL (ref 150–400)
RBC: 2.9 MIL/uL — ABNORMAL LOW (ref 4.22–5.81)
RDW: 15 % (ref 11.5–15.5)
WBC: 8.3 10*3/uL (ref 4.0–10.5)
nRBC: 0 % (ref 0.0–0.2)

## 2018-08-16 LAB — APTT
aPTT: 107 seconds — ABNORMAL HIGH (ref 24–36)
aPTT: 81 seconds — ABNORMAL HIGH (ref 24–36)

## 2018-08-16 LAB — PHOSPHORUS: Phosphorus: 5.1 mg/dL — ABNORMAL HIGH (ref 2.5–4.6)

## 2018-08-16 MED ORDER — CARVEDILOL 3.125 MG PO TABS
3.1250 mg | ORAL_TABLET | Freq: Two times a day (BID) | ORAL | Status: DC
  Administered 2018-08-16 – 2018-08-18 (×4): 3.125 mg via ORAL
  Filled 2018-08-16 (×5): qty 1

## 2018-08-16 NOTE — Progress Notes (Signed)
ANTICOAGULATION CONSULT NOTE  Pharmacy Consult for heparin Indication: chest pain/ACS  Patient Measurements: Height: 5\' 8"  (172.7 cm) Weight: 210 lb 12.2 oz (95.6 kg) IBW/kg (Calculated) : 68.4 Heparin Dosing Weight: 90.4 kg  Vital Signs: Temp: 98.3 F (36.8 C) (03/22 0800) Temp Source: Oral (03/22 0800) BP: 106/67 (03/22 0600) Pulse Rate: 81 (03/22 0600)  Labs: Recent Labs    08/14/18 1946 08/15/18 0406 08/15/18 0954 08/15/18 1929 08/16/18 0413  HGB 8.9* 8.9*  --   --  8.9*  HCT 28.9* 28.5*  --   --  27.7*  PLT 215 203  --   --  184  APTT 81*  --   --  89* 107*  HEPARINUNFRC 1.10*  --   --  0.77* 0.83*  CREATININE 10.78* 7.03*  --   --  9.31*  TROPONINI 5.29* 4.57* 4.21*  --   --     Estimated Creatinine Clearance: 9 mL/min (A) (by C-G formula based on SCr of 9.31 mg/dL (H)).   Medical History: Past Medical History:  Diagnosis Date  . 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 Amputee, below knee, left ([place])   . Anemia   . Asthma   . Cardiomyopathy ([place])   . CHF (congestive heart failure) ([place])   . Chronic systolic heart failure ([place])   . Complication of anesthesia    hypotension  . COPD (chronic obstructive pulmonary disease) ([place])   . Coronary artery disease   . Dialysis patient ([place])    Mon, Wed, Fri  . End stage renal disease ([place])   . GERD (gastroesophageal reflux disease)   . Headache   . History of kidney stones   . History of pulmonary embolism   . HLD (hyperlipidemia)   . HTN (hypertension)   . Hyperparathyroidism   . Myocardial infarction ([place])   . Peripheral vascular disease ([place])   . Shortness of breath dyspnea   . Sleep apnea    NO C-PAP, Patient stated in process of  "getting one"   . Tobacco dependence     Medications:  Scheduled:  . aspirin EC  81 mg Oral Daily  . atorvastatin  80 mg Oral q1800  . bisacodyl  10 mg Rectal Once  . carvedilol  3.125 mg Oral BID  . Chlorhexidine Gluconate Cloth  6 each Topical Q0600  .  epoetin (EPOGEN/PROCRIT) injection  4,000 Units Intravenous Q M,W,F-HD  . erythromycin  1 application Right Eye QHS  . feeding supplement (NEPRO CARB STEADY)  237 mL Oral BID BM  . fluticasone  1 spray Each Nare Daily  . isosorbide mononitrate  60 mg Oral Daily  . loratadine  10 mg Oral Daily  . mouth rinse  15 mL Mouth Rinse BID  . midodrine  10 mg Oral Q8H  . multivitamin  1 tablet Oral QHS  . pantoprazole (PROTONIX) IV  40 mg Intravenous Q12H  . pregabalin  75 mg Oral Daily  . ranolazine  500 mg Oral BID  . senna-docusate  2 tablet Oral BID  . tiotropium  18 mcg Inhalation Daily  . vitamin C  500 mg Oral BID    Assessment: Patient admitted for CP from nursing facility, trops 1.89. Patient takes eliquis 5 mg bid PTA (unclear why) and unsure when last dose was.  Patient was started on heparin drip for UA/ACS Baseline labs showed HL >3.6, hgb 7.6, PT/INR 15.9/1.3 Patient had cardiac catheterization (3/17) showing occluded proximal right coronary artery left circumflex  artery and significant stenosis of distal left main and LAD at 85%.   Heparin Course 3/16 initiated at 1000 units/hr 3/17 0453 aPTT 114s: rate reduced to 850 units/hr 3/17 2019 aPTT 60s: rate increased to 900 units/hr 3/18 2249 aPTT 69s: rate increased to 950 units/hr 3/22 0413 aPTT 107s: rate reduced to 800 units/hr 3/22 1317 aPTT 81s, HL 0.68  Goal of Therapy:  APTT 66 - 102 seconds Heparin level 0.3-0.7 units/ml Monitor platelets by anticoagulation protocol: Yes   Plan:  --continue heparin infusion at 800 units/hr --heparin levels and aPTT appear to be correlating: recheck HL in 8 hours --Hgb, PLT stable: CBC in am  Pharmacy will continue to monitor and adjust per consult.   Dallie Piles, PharmD Clinical Pharmacist 08/16/2018 12:37 PM

## 2018-08-16 NOTE — Progress Notes (Signed)
ANTICOAGULATION CONSULT NOTE  Pharmacy Consult for heparin Indication: chest pain/ACS  Patient Measurements: Height: 5\' 8"  (172.7 cm) Weight: 210 lb 12.2 oz (95.6 kg) IBW/kg (Calculated) : 68.4 Heparin Dosing Weight: 90.4 kg  Vital Signs: Temp: 98.6 F (37 C) (03/22 1935) Temp Source: Oral (03/22 1935) BP: 121/74 (03/22 1935) Pulse Rate: 86 (03/22 1935)  Labs: Recent Labs    08/14/18 1946 08/15/18 0406 08/15/18 0954 08/15/18 1929 08/16/18 0413 08/16/18 1317 08/16/18 2201  HGB 8.9* 8.9*  --   --  8.9*  --   --   HCT 28.9* 28.5*  --   --  27.7*  --   --   PLT 215 203  --   --  184  --   --   APTT 81*  --   --  89* 107* 81*  --   HEPARINUNFRC 1.10*  --   --  0.77* 0.83* 0.68 0.48  CREATININE 10.78* 7.03*  --   --  9.31*  --   --   TROPONINI 5.29* 4.57* 4.21*  --   --   --   --     Estimated Creatinine Clearance: 9 mL/min (A) (by C-G formula based on SCr of 9.31 mg/dL (H)).   Medical History: Past Medical History:  Diagnosis Date  . 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 Amputee, below knee, left ([place])   . Anemia   . Asthma   . Cardiomyopathy ([place])   . CHF (congestive heart failure) ([place])   . Chronic systolic heart failure ([place])   . Complication of anesthesia    hypotension  . COPD (chronic obstructive pulmonary disease) ([place])   . Coronary artery disease   . Dialysis patient ([place])    Mon, Wed, Fri  . End stage renal disease ([place])   . GERD (gastroesophageal reflux disease)   . Headache   . History of kidney stones   . History of pulmonary embolism   . HLD (hyperlipidemia)   . HTN (hypertension)   . Hyperparathyroidism   . Myocardial infarction ([place])   . Peripheral vascular disease ([place])   . Shortness of breath dyspnea   . Sleep apnea    NO C-PAP, Patient stated in process of  "getting one"   . Tobacco dependence     Medications:  Scheduled:  . aspirin EC  81 mg Oral Daily  . atorvastatin  80 mg Oral q1800  . bisacodyl  10 mg Rectal  Once  . carvedilol  3.125 mg Oral BID  . Chlorhexidine Gluconate Cloth  6 each Topical Q0600  . epoetin (EPOGEN/PROCRIT) injection  4,000 Units Intravenous Q M,W,F-HD  . erythromycin  1 application Right Eye QHS  . feeding supplement (NEPRO CARB STEADY)  237 mL Oral BID BM  . fluticasone  1 spray Each Nare Daily  . isosorbide mononitrate  60 mg Oral Daily  . loratadine  10 mg Oral Daily  . mouth rinse  15 mL Mouth Rinse BID  . midodrine  10 mg Oral Q8H  . multivitamin  1 tablet Oral QHS  . pantoprazole (PROTONIX) IV  40 mg Intravenous Q12H  . pregabalin  75 mg Oral Daily  . ranolazine  500 mg Oral BID  . senna-docusate  2 tablet Oral BID  . tiotropium  18 mcg Inhalation Daily  . vitamin C  500 mg Oral BID    Assessment: Patient admitted for CP from nursing facility, trops 1.89. Patient takes eliquis 5 mg bid  PTA (unclear why) and unsure when last dose was.  Patient was started on heparin drip for UA/ACS Baseline labs showed HL >3.6, hgb 7.6, PT/INR 15.9/1.3 Patient had cardiac catheterization (3/17) showing occluded proximal right coronary artery left circumflex artery and significant stenosis of distal left main and LAD at 85%.   Heparin Course 3/16 initiated at 1000 units/hr 3/17 0453 aPTT 114s: rate reduced to 850 units/hr 3/17 2019 aPTT 60s: rate increased to 900 units/hr 3/18 2249 aPTT 69s: rate increased to 950 units/hr 3/22 0413 aPTT 107s: rate reduced to 800 units/hr 3/22 1317 aPTT 81s, HL 0.68 3/22 2201 aPTT HL: 0.48  Goal of Therapy:  APTT 66 - 102 seconds Heparin level 0.3-0.7 units/ml Monitor platelets by anticoagulation protocol: Yes   Plan:  3/22 @ 2201 Level now therapeutic x 2.  Will continue heparin infusion at 800 units/hr Will continue to recheck HL and CBCs daily with AM labs per protocol.   Pharmacy will continue to monitor and adjust per consult.   Pernell Dupre, PharmD, BCPS Clinical Pharmacist 08/16/2018 10:45 PM

## 2018-08-16 NOTE — Progress Notes (Signed)
[place] at [place] NAME: [name]    MR#:  [id number]  DATE OF BIRTH:  02/03/1956  SUBJECTIVE:  CHIEF COMPLAINT:   Chief Complaint  Patient presents with  . Chest Pain   Came with chest pain.  Noted to have low hemoglobin and guaiac positive stool.  Started on heparin drip and admitted to ICU.  Cardiac catheterization showed significant disease.  Followed by stress test.  GI has signed off.  Cardiology wants to manage medically.  Patient has no chest pain today.  Feels weak.  Continues to be on heparin drip.  REVIEW OF SYSTEMS:  CONSTITUTIONAL: No fever, fatigue or weakness.  EYES: No blurred or double vision.  EARS, NOSE, AND THROAT: No tinnitus or ear pain.  RESPIRATORY: No cough, shortness of breath, wheezing or hemoptysis.  CARDIOVASCULAR: Have chest pain, no orthopnea, edema.  GASTROINTESTINAL: No nausea, vomiting, diarrhea or abdominal pain.  GENITOURINARY: No dysuria, hematuria.  ENDOCRINE: No polyuria, nocturia,  HEMATOLOGY: No anemia, easy bruising or bleeding SKIN: No rash or lesion. MUSCULOSKELETAL: No joint pain or arthritis.   NEUROLOGIC: No tingling, numbness, weakness.  PSYCHIATRY: No anxiety or depression.   ROS  DRUG ALLERGIES:   Allergies  Allergen Reactions  . Dust Mite Extract Other (See Comments)    Reaction: unknown    VITALS:  Blood pressure 106/67, pulse 81, temperature 98.3 F (36.8 C), temperature source Oral, resp. rate 14, height 5\' 8"  (1.727 m), weight 95.6 kg, SpO2 98 %.  PHYSICAL EXAMINATION:  GENERAL:  63 y.o.-year-old patient lying in the bed .  Obese EYES: Pupils equal, round, reactive to light and accommodation. No scleral icterus. Extraocular muscles intact.  HEENT: Head atraumatic, normocephalic. Oropharynx and nasopharynx clear.  NECK:  Supple, no jugular venous distention. No thyroid enlargement, no tenderness.  LUNGS: Normal breath sounds bilaterally, no wheezing, rales,rhonchi  or crepitation. No use of accessory muscles of respiration.  CARDIOVASCULAR: S1, S2 normal. No murmurs, rubs, or gallops.  ABDOMEN: Soft, nontender, nondistended. Bowel sounds present. No organomegaly or mass.  EXTREMITIES: No pedal edema, cyanosis, or clubbing.  NEUROLOGIC: Cranial nerves II through XII are intact. Muscle strength 4/5 in all extremities. Sensation intact. Gait not checked.  PSYCHIATRIC: The patient is alert and oriented x 3.  SKIN: No obvious rash, lesion, or ulcer.   Physical Exam LABORATORY PANEL:   CBC Recent Labs  Lab 08/16/18 0413  WBC 8.3  HGB 8.9*  HCT 27.7*  PLT 184   ------------------------------------------------------------------------------------------------------------------  Chemistries  Recent Labs  Lab 08/10/18 0243  08/16/18 0413  NA 138   < > 138  K 4.1   < > 3.8  CL 103   < > 101  CO2 21*   < > 26  GLUCOSE 131*   < > 122*  BUN 67*   < > 50*  CREATININE 10.25*   < > 9.31*  CALCIUM 9.1   < > 9.3  MG  --    < > 2.4  AST 35  --   --   ALT 29  --   --   ALKPHOS 82  --   --   BILITOT 0.3  --   --    < > = values in this interval not displayed.   ------------------------------------------------------------------------------------------------------------------  Cardiac Enzymes Recent Labs  Lab 08/15/18 0406 08/15/18 0954  TROPONINI 4.57* 4.21*   ------------------------------------------------------------------------------------------------------------------  RADIOLOGY:  No results found.  ASSESSMENT AND PLAN:   Active Problems:  Non-STEMI (non-ST elevated myocardial infarction) (HCC)   Acute GI bleeding  1.  None-ST elevation MI. on IV heparin per Dr. Alveria Apley recommendation while transfusing the patient 2 units of packed red blood cells for his anemia. PRN nitro. Eliquis held 2D echocardiogram with ejection fraction 20 to 25% On betablocker and statin. Significant blockages in LAD and left main coronary and  occluded proximal right coronary and proximal left circumflex.  Nuclear stress test was done after this which was abnormal. Dr. Clayborn Bigness has advised medical management. Nuclear stress test showed significant decline in ejection fraction up to 16% and high risk study.  2.  GI bleeding with acute blood loss anemia.   Heme positive stool.  2 units packed RBC transfused and now hemoglobin stable. GI has signed off. Patient follow-up. Monitor hemoglobin  3.  End-stage renal disease on hemodialysis likely with associated fluid overload could be related to acute on top of chronic systolic CHF.  Nephrology consultation   PhosLo and Renvela will be continued.  Cont HD per nephro inpatient.  4.  COPD.  The Spiriva will be continued and he will be placed on continued care and Combivent.  5.  Dyslipidemia.  Lipitor dose will be increased .  6.  Hypertension.  His Coreg will be resumed.  We will monitor his blood pressure given usual hypotension.  7.  DVT prophylaxis.   IV heparin drip   All the records are reviewed and case discussed with Care Management/Social Worker Management plans discussed with the patient, family and they are in agreement.  CODE STATUS: Full  TOTAL TIME TAKING CARE OF THIS PATIENT: 35 minutes.   Neita Carp M.D on 08/16/2018   Between 7am to 6pm - Pager - [phone]  After 6pm go to [email or web address] - password [email or web address] [facility]  Office  [phone]  CC: Primary care physician; Donnie Coffin, MD  Note: This dictation was prepared with Dragon dictation along with smaller phrase technology. Any transcriptional errors that result from this process are unintentional.

## 2018-08-16 NOTE — Progress Notes (Signed)
Pt transferred from CCU. No complaints of pain, SR on telemetry, VSS. I will continue to assess.

## 2018-08-16 NOTE — Progress Notes (Signed)
[place] for heparin Indication: chest pain/ACS  Allergies  Allergen Reactions  . Dust Mite Extract Other (See Comments)    Reaction: unknown    Patient Measurements: Height: 5\' 8"  (172.7 cm) Weight: 208 lb 8.9 oz (94.6 kg) IBW/kg (Calculated) : 68.4 Heparin Dosing Weight: 90.4 kg  Vital Signs: Temp: 99 F (37.2 C) (03/22 0000) Temp Source: Oral (03/22 0000) BP: 82/55 (03/22 0400) Pulse Rate: 76 (03/22 0400)  Labs: Recent Labs    08/14/18 1946 08/15/18 0406 08/15/18 0954 08/15/18 1929 08/16/18 0413  HGB 8.9* 8.9*  --   --  8.9*  HCT 28.9* 28.5*  --   --  27.7*  PLT 215 203  --   --  184  APTT 81*  --   --  89* 107*  HEPARINUNFRC 1.10*  --   --  0.77* 0.83*  CREATININE 10.78* 7.03*  --   --  9.31*  TROPONINI 5.29* 4.57* 4.21*  --   --     Estimated Creatinine Clearance: 8.9 mL/min (A) (by C-G formula based on SCr of 9.31 mg/dL (H)).   Medical History: Past Medical History:  Diagnosis Date  . 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 Amputee, below knee, left ([place])   . Anemia   . Asthma   . Cardiomyopathy ([place])   . CHF (congestive heart failure) ([place])   . Chronic systolic heart failure ([place])   . Complication of anesthesia    hypotension  . COPD (chronic obstructive pulmonary disease) ([place])   . Coronary artery disease   . Dialysis patient ([place])    Mon, Wed, Fri  . End stage renal disease ([place])   . GERD (gastroesophageal reflux disease)   . Headache   . History of kidney stones   . History of pulmonary embolism   . HLD (hyperlipidemia)   . HTN (hypertension)   . Hyperparathyroidism   . Myocardial infarction ([place])   . Peripheral vascular disease ([place])   . Shortness of breath dyspnea   . Sleep apnea    NO C-PAP, Patient stated in process of  "getting one"   . Tobacco dependence     Medications:  Scheduled:  . aspirin EC  81 mg Oral Daily  . atorvastatin  80 mg Oral q1800  . bisacodyl  10 mg  Rectal Once  . carvedilol  6.25 mg Oral BID  . Chlorhexidine Gluconate Cloth  6 each Topical Q0600  . epoetin (EPOGEN/PROCRIT) injection  4,000 Units Intravenous Q M,W,F-HD  . erythromycin  1 application Right Eye QHS  . feeding supplement (NEPRO CARB STEADY)  237 mL Oral BID BM  . fluticasone  1 spray Each Nare Daily  . isosorbide mononitrate  60 mg Oral Daily  . loratadine  10 mg Oral Daily  . mouth rinse  15 mL Mouth Rinse BID  . midodrine  10 mg Oral Q8H  . multivitamin  1 tablet Oral QHS  . pantoprazole (PROTONIX) IV  40 mg Intravenous Q12H  . pregabalin  75 mg Oral Daily  . ranolazine  500 mg Oral BID  . senna-docusate  2 tablet Oral BID  . tiotropium  18 mcg Inhalation Daily  . vitamin C  500 mg Oral BID    Assessment: Patient admitted for CP from nursing facility, trops 1.89. Patient takes eliquis 5 mg bid PTA (unclear why) and unsure when last dose was.  Patient is being started on heparin drip for UA/ACS Baseline  labs show HL >3.6, hgb 7.6, PT/INR 15.9/1.3  Patient remains on pantoprazole infusion. Patient had cardiac catheterization (3/17) showing occluded proximal right coronary artery left circumflex artery and significant stenosis of distal left main and LAD at 85%.   Heparin currently infusing at 950 units/hr.   3/20-Patient is continuing to refuse labs. Per AM ICU rounds discussion, will discontinue heparin drip at this time. Will order heparin 5000 units Q12hr as patient is on dialysis.   3/21 Heparin infusion restarted.  @ 1929 HL = 0.77 ,  APTT = 89  Goal of Therapy:  APTT 66 - 102 seconds Heparin level 0.3-0.7 units/ml Monitor platelets by anticoagulation protocol: Yes   Plan:  3/22 @ 0413 HL: 0.83, aPTT: 107- Both levels are supratherapeutic. Will decrease heparin infusion to 800 units/hr. Recheck aPTT and HL in 8 hours.   Pharmacy will continue to monitor and adjust per consult.   Pernell Dupre, PharmD, BCPS Clinical Pharmacist 08/16/2018 4:50  AM

## 2018-08-16 NOTE — Progress Notes (Signed)
[facility]  ROUNDING NOTE   Subjective:  Patient remains on heparin drip at this time. Troponin still a bit high at 4.2. Due for dialysis tomorrow.  Objective:  Vital signs in last 24 hours:  Temp:  [98.3 F (36.8 C)-99 F (37.2 C)] 98.3 F (36.8 C) (03/22 0800) Pulse Rate:  [25-102] 78 (03/22 1200) Resp:  [11-25] 13 (03/22 1000) BP: (80-147)/(49-112) 115/79 (03/22 1200) SpO2:  [91 %-100 %] 97 % (03/22 1200) Weight:  [95.6 kg] 95.6 kg (03/22 0500)  Weight change: 1.2 kg Filed Weights   08/15/18 0028 08/15/18 0145 08/16/18 0500  Weight: 92.7 kg 94.6 kg 95.6 kg    Intake/Output: I/O last 3 completed shifts: In: 549.5 [P.O.:350; I.V.:199.5] Out: 1700 [Other:1700]   Intake/Output this shift:  No intake/output data recorded.  Physical Exam: General: No acute distress  Head: Normocephalic, atraumatic. Moist oral mucosal membranes  Eyes: Anicteric  Neck: Supple, trachea midline  Lungs:  Clear to auscultation, normal effort  Heart: S1S2 no rubs  Abdomen:  Soft, nontender, bowel sounds present  Extremities: Trace peripheral edema, L BKA  Neurologic: Awake, alert, following commands  Skin: No lesions  Access: LUE AVF    Basic Metabolic Panel: Recent Labs  Lab 08/12/18 0613 08/12/18 1417 08/12/18 2249 08/14/18 1946 08/15/18 0406 08/16/18 0413  NA 139  --  138 137 138 138  K 5.1  --  4.0 4.5 3.7 3.8  CL 100  --  99 98 98 101  CO2 27  --  30 25 27 26   GLUCOSE 110*  --  115* 92 77 122*  BUN 57*  --  36* 65* 36* 50*  CREATININE 8.81*  --  6.74* 10.78* 7.03* 9.31*  CALCIUM 9.1  --  8.7* 9.0 8.8* 9.3  MG  --   --   --   --  2.1 2.4  PHOS 5.6* 3.5 4.5 6.2* 4.0 5.1*    Liver Function Tests: Recent Labs  Lab 08/10/18 0243 08/12/18 0613 08/12/18 2249 08/14/18 1946  AST 35  --   --   --   ALT 29  --   --   --   ALKPHOS 82  --   --   --   BILITOT 0.3  --   --   --   PROT 6.6  --   --   --   ALBUMIN 3.0* 3.0* 2.7* 2.9*   Recent Labs  Lab  08/10/18 0243  LIPASE 86*   No results for input(s): AMMONIA in the last 168 hours.  CBC: Recent Labs  Lab 08/10/18 0243  08/12/18 0613 08/13/18 0412 08/14/18 1946 08/15/18 0406 08/16/18 0413  WBC 8.9   < > 8.9 8.2 8.9 9.0 8.3  NEUTROABS 5.5  --   --   --   --   --   --   HGB 7.6*   < > 9.6* 9.3* 8.9* 8.9* 8.9*  HCT 24.2*   < > 31.4* 29.8* 28.9* 28.5* 27.7*  MCV 100.4*   < > 98.7 97.7 98.0 96.9 95.5  PLT 209   < > 189 185 215 203 184   < > = values in this interval not displayed.    Cardiac Enzymes: Recent Labs  Lab 08/13/18 1213 08/13/18 1855 08/14/18 1946 08/15/18 0406 08/15/18 0954  TROPONINI 7.08* 6.33* 5.29* 4.57* 4.21*    BNP: Invalid input(s): POCBNP  CBG: Recent Labs  Lab 08/10/18 0644 08/12/18 1936  GLUCAP 104* 87    Microbiology: Results  for orders placed or performed during the hospital encounter of 08/10/18  MRSA PCR Screening     Status: Abnormal   Collection Time: 08/10/18  6:54 AM  Result Value Ref Range Status   MRSA by PCR POSITIVE (A) NEGATIVE Final    Comment:        The GeneXpert MRSA Assay (FDA approved for NASAL specimens only), is one component of a comprehensive MRSA colonization surveillance program. It is not intended to diagnose MRSA infection nor to guide or monitor treatment for MRSA infections. RESULT CALLED TO, READ BACK BY AND VERIFIED WITH:  [facility] [place] AT 7494 08/10/18 KM/SDR Performed at [facility], [address] [place],  [address]     Coagulation Studies: No results for input(s): LABPROT, INR in the last 72 hours.  Urinalysis: No results for input(s): COLORURINE, LABSPEC, PHURINE, GLUCOSEU, HGBUR, BILIRUBINUR, KETONESUR, PROTEINUR, UROBILINOGEN, NITRITE, LEUKOCYTESUR in the last 72 hours.  Invalid input(s): APPERANCEUR    Imaging: No results found.   Medications:   . heparin 800 Units/hr (08/16/18 0700)   . aspirin EC  81 mg Oral Daily  . atorvastatin  80 mg Oral q1800   . bisacodyl  10 mg Rectal Once  . carvedilol  3.125 mg Oral BID  . Chlorhexidine Gluconate Cloth  6 each Topical Q0600  . epoetin (EPOGEN/PROCRIT) injection  4,000 Units Intravenous Q M,W,F-HD  . erythromycin  1 application Right Eye QHS  . feeding supplement (NEPRO CARB STEADY)  237 mL Oral BID BM  . fluticasone  1 spray Each Nare Daily  . isosorbide mononitrate  60 mg Oral Daily  . loratadine  10 mg Oral Daily  . mouth rinse  15 mL Mouth Rinse BID  . midodrine  10 mg Oral Q8H  . multivitamin  1 tablet Oral QHS  . pantoprazole (PROTONIX) IV  40 mg Intravenous Q12H  . pregabalin  75 mg Oral Daily  . ranolazine  500 mg Oral BID  . senna-docusate  2 tablet Oral BID  . tiotropium  18 mcg Inhalation Daily  . vitamin C  500 mg Oral BID   acetaminophen, albuterol, guaiFENesin, ipratropium-albuterol, morphine injection, nitroGLYCERIN, ondansetron (ZOFRAN) IV, polyethylene glycol, traMADol, zolpidem  Assessment/ Plan:  63 y.o. male with ESRD on hemodialysis, hypertension, hyperlipidemia, anemia, COPD, peripheral vascular disease, left BKA, right toe amputations, severe coronary artery disease  MWF [facility]ph[facility].   1.  End-stage renal disease 2.  Chest pain/Severe underlying CAD 3.  Anemia of chronic kidney disease 4.  Chronic hypotension 5.  Secondary hyperparathyroidism  Plan: Patient continues to have a mildly elevated troponin.  He remains on heparin drip.  He will be due for hemodialysis tomorrow.  Ultrafiltration target 1 to 1.5 kg tomorrow.  Continue midodrine 10 mg every 8 hours for hypotension.  Otherwise plan as per pulmonary/critical care as well as cardiology.  Guarded prognosis.   LOS: 6 [name] 3/22/20201:34 PM

## 2018-08-16 NOTE — Progress Notes (Signed)
CRITICAL CARE NOTE       SUBJECTIVE FINDINGS & SIGNIFICANT EVENTS    Resting in bed comfortably, no new complaints.     PAST MEDICAL HISTORY   Past Medical History:  Diagnosis Date  . 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 Amputee, below knee, left ([place])   . Anemia   . Asthma   . Cardiomyopathy ([place])   . CHF (congestive heart failure) ([place])   . Chronic systolic heart failure ([place])   . Complication of anesthesia    hypotension  . COPD (chronic obstructive pulmonary disease) ([place])   . Coronary artery disease   . Dialysis patient ([place])    Mon, Wed, Fri  . End stage renal disease ([place])   . GERD (gastroesophageal reflux disease)   . Headache   . History of kidney stones   . History of pulmonary embolism   . HLD (hyperlipidemia)   . HTN (hypertension)   . Hyperparathyroidism   . Myocardial infarction ([place])   . Peripheral vascular disease ([place])   . Shortness of breath dyspnea   . Sleep apnea    NO C-PAP, Patient stated in process of  "getting one"   . Tobacco dependence      SURGICAL HISTORY   Past Surgical History:  Procedure Laterality Date  . A/V FISTULAGRAM Left 08/14/2017   Procedure: A/V FISTULAGRAM;  Surgeon: Algernon Huxley, MD;  Location: [facility];  Service: Cardiovascular;  Laterality: Left;  . AMPUTATION Left 05/06/2014   Procedure: AMPUTATION BELOW KNEE;  Surgeon: Elam Dutch, MD;  Location: [facility];  Service: Vascular;  Laterality: Left;  . AMPUTATION Right 01/12/2015   Procedure: Foot transmetatarsal amputation;  Surgeon: Algernon Huxley, MD;  Location: [facility];  Service: Vascular;  Laterality: Right;  . APPLICATION OF WOUND VAC Right 03/01/2015   Procedure: Application of Bio-connekt graft and wound vac application to right foot ;  Surgeon: Algernon Huxley, MD;   Location: [facility];  Service: Vascular;  Laterality: Right;  . AV FISTULA PLACEMENT Left   . AV FISTULA PLACEMENT Left 11/28/2016   Procedure: ARTERIOVENOUS (AV) FISTULA CREATION;  Surgeon: Algernon Huxley, MD;  Location: [facility];  Service: Vascular;  Laterality: Left;  . CARDIAC CATHETERIZATION     stent placement   . CORONARY ANGIOPLASTY    . DIALYSIS/PERMA CATHETER INSERTION N/A 07/31/2016   Procedure: Dialysis/Perma Catheter Insertion;  Surgeon: Algernon Huxley, MD;  Location: [facility];  Service: Cardiovascular;  Laterality: N/A;  . DIALYSIS/PERMA CATHETER INSERTION N/A 07/25/2017   Procedure: DIALYSIS/PERMA CATHETER INSERTION and fistulagram;  Surgeon: Algernon Huxley, MD;  Location: [facility];  Service: Cardiovascular;  Laterality: N/A;  . DIALYSIS/PERMA CATHETER REMOVAL N/A 07/22/2017   Procedure: DIALYSIS/PERMA CATHETER REMOVAL;  Surgeon: Katha Cabal, MD;  Location: [facility];  Service: Cardiovascular;  Laterality: N/A;  . DIALYSIS/PERMA CATHETER REMOVAL N/A 02/05/2018   Procedure: DIALYSIS/PERMA CATHETER REMOVAL;  Surgeon: Algernon Huxley, MD;  Location: [facility];  Service: Cardiovascular;  Laterality: N/A;  . IR FLUORO GUIDE CV LINE LEFT  04/10/2017  . LEFT HEART CATH AND CORONARY ANGIOGRAPHY N/A 08/11/2018   Procedure: LEFT HEART CATH AND CORONARY ANGIOGRAPHY;  Surgeon: Corey Skains, MD;  Location: [facility];  Service: Cardiovascular;  Laterality: N/A;  . LIGATION OF ARTERIOVENOUS  FISTULA Right 01/31/2016   Procedure: LIGATION OF ARTERIOVENOUS  FISTULA;  Surgeon: Algernon Huxley,  MD;  Location: [facility];  Service: Vascular;  Laterality: Right;  . PERIPHERAL VASCULAR CATHETERIZATION Right 12/15/2014   Procedure: Lower Extremity Angiography;  Surgeon: Algernon Huxley, MD;  Location: [facility];  Service: Cardiovascular;  Laterality: Right;  . PERIPHERAL VASCULAR CATHETERIZATION  12/15/2014   Procedure: Lower Extremity Intervention;   Surgeon: Algernon Huxley, MD;  Location: [facility];  Service: Cardiovascular;;  . PERIPHERAL VASCULAR CATHETERIZATION Right 08/14/2015   Procedure: A/V Shuntogram/Fistulagram;  Surgeon: Algernon Huxley, MD;  Location: [facility];  Service: Cardiovascular;  Laterality: Right;  . PERIPHERAL VASCULAR CATHETERIZATION N/A 08/14/2015   Procedure: A/V Shunt Intervention;  Surgeon: Algernon Huxley, MD;  Location: [facility];  Service: Cardiovascular;  Laterality: N/A;  . PERIPHERAL VASCULAR CATHETERIZATION N/A 01/11/2016   Procedure: Dialysis/Perma Catheter Insertion;  Surgeon: Algernon Huxley, MD;  Location: [facility];  Service: Cardiovascular;  Laterality: N/A;  . REVISON OF ARTERIOVENOUS FISTULA Right 02/17/2016   Procedure: removal of AV fistula;  Surgeon: Serafina Mitchell, MD;  Location: [facility];  Service: Vascular;  Laterality: Right;  . REVISON OF ARTERIOVENOUS FISTULA Right 01/31/2016   Procedure: REVISON OF ARTERIOVENOUS FISTULA ( BRACHIOCEPHALIC ) W/ ARTEGRAFT;  Surgeon: Algernon Huxley, MD;  Location: [facility];  Service: Vascular;  Laterality: Right;  . TRANSMETATARSAL AMPUTATION Right 05/04/2015   Procedure: TRANSMETATARSAL AMPUTATION REVISION, great toe amputation;  Surgeon: Algernon Huxley, MD;  Location: [facility];  Service: Vascular;  Laterality: Right;     FAMILY HISTORY   Family History  Problem Relation Age of Onset  . Leukemia Mother   . Heart attack Father   . Heart failure Other   . Hypertension Other   . Leukemia Other   . Diabetes Other   . Prostate cancer Neg Hx   . Kidney cancer Neg Hx   . Bladder Cancer Neg Hx      SOCIAL HISTORY   Social History   Tobacco Use  . Smoking status: Light Tobacco Smoker    Packs/day: 0.25    Years: 30.00    Pack years: 7.50    Types: Cigarettes  . Smokeless tobacco: Never Used  . Tobacco comment: 5  Substance Use Topics  . Alcohol use: No    Alcohol/week: 0.0 standard drinks  . Drug use: No    Comment: has used  crack cocaine in past      MEDICATIONS   Current Medication:  Current Facility-Administered Medications:  .  acetaminophen (TYLENOL) tablet 650 mg, 650 mg, Oral, Q4H PRN, Mansy, Jan A, MD .  albuterol (PROVENTIL) (2.5 MG/3ML) 0.083% nebulizer solution 2.5 mg, 2.5 mg, Inhalation, Q6H PRN, Mansy, Jan A, MD .  aspirin EC tablet 81 mg, 81 mg, Oral, Daily, Mansy, Jan A, MD, 81 mg at 08/16/18 1015 .  atorvastatin (LIPITOR) tablet 80 mg, 80 mg, Oral, q1800, Mansy, Jan A, MD, 80 mg at 08/15/18 1707 .  bisacodyl (DULCOLAX) suppository 10 mg, 10 mg, Rectal, Once, Macai Sisneros, MD .  carvedilol (COREG) tablet 3.125 mg, 3.125 mg, Oral, BID, Callwood, Dwayne D, MD, 3.125 mg at 08/16/18 1015 .  Chlorhexidine Gluconate Cloth 2 % PADS 6 each, 6 each, Topical, Q0600, Murlean Iba, MD, 6 each at 08/16/18 0534 .  epoetin alfa (EPOGEN,PROCRIT) injection 4,000 Units, 4,000 Units, Intravenous, Q M,W,F-HD, Holley Raring, Munsoor, MD, 4,000 Units at 08/15/18 0041 .  erythromycin ophthalmic ointment 1 application, 1 application, Right Eye, QHS, Mansy, Arvella Merles, MD .  feeding supplement (NEPRO CARB STEADY) liquid 237 mL, 237 mL, Oral, BID BM, Holley Raring, Munsoor, MD, Stopped at 08/16/18 1034 .  fluticasone (FLONASE) 50 MCG/ACT nasal spray 1 spray, 1 spray, Each Nare, Daily, Mansy, Jan A, MD, 1 spray at 08/13/18 1050 .  guaiFENesin (ROBITUSSIN) 100 MG/5ML solution 200 mg, 200 mg, Oral, TID PRN, Mansy, Jan A, MD .  heparin ADULT infusion 100 units/mL (25000 units/226mL sodium chloride 0.45%), 800 Units/hr, Intravenous, Continuous, Hallaji, Sheema M, RPH, Last Rate: 8 mL/hr at 08/16/18 0454, 800 Units/hr at 08/16/18 0454 .  ipratropium-albuterol (DUONEB) 0.5-2.5 (3) MG/3ML nebulizer solution 3 mL, 3 mL, Inhalation, Q6H PRN, Mansy, Jan A, MD .  isosorbide mononitrate (IMDUR) 24 hr tablet 60 mg, 60 mg, Oral, Daily, Lanney Gins, Rashad Auld, MD, 60 mg at 08/16/18 1015 .  loratadine (CLARITIN) tablet 10 mg, 10 mg, Oral, Daily, Mansy, Jan A,  MD, 10 mg at 08/16/18 1015 .  MEDLINE mouth rinse, 15 mL, Mouth Rinse, BID, Ottie Glazier, MD, Stopped at 08/16/18 1033 .  midodrine (PROAMATINE) tablet 10 mg, 10 mg, Oral, Q8H, Mansy, Jan A, MD, 10 mg at 08/16/18 0531 .  morphine 2 MG/ML injection 2 mg, 2 mg, Intravenous, Q4H PRN, Darel Hong D, NP, 2 mg at 08/15/18 1024 .  multivitamin (RENA-VIT) tablet 1 tablet, 1 tablet, Oral, QHS, Charlett Nose, RPH, 1 tablet at 08/15/18 2114 .  nitroGLYCERIN (NITROSTAT) SL tablet 0.4 mg, 0.4 mg, Sublingual, Q5 min PRN, Darel Hong D, NP, 0.4 mg at 08/15/18 7017 .  ondansetron (ZOFRAN) injection 4 mg, 4 mg, Intravenous, Q6H PRN, Mansy, Jan A, MD .  pantoprazole (PROTONIX) injection 40 mg, 40 mg, Intravenous, Q12H, Lanney Gins, Garlene Apperson, MD, 40 mg at 08/16/18 1017 .  polyethylene glycol (MIRALAX / GLYCOLAX) packet 17 g, 17 g, Oral, Daily PRN, Mansy, Jan A, MD .  pregabalin (LYRICA) capsule 75 mg, 75 mg, Oral, Daily, Mansy, Jan A, MD, 75 mg at 08/16/18 1016 .  ranolazine (RANEXA) 12 hr tablet 500 mg, 500 mg, Oral, BID, Tan Clopper, MD, 500 mg at 08/16/18 1016 .  senna-docusate (Senokot-S) tablet 2 tablet, 2 tablet, Oral, BID, Ottie Glazier, MD, 2 tablet at 08/16/18 1016 .  tiotropium (SPIRIVA) inhalation capsule ([facility] use ONLY) 18 mcg, 18 mcg, Inhalation, Daily, Mansy, Jan A, MD, 18 mcg at 08/16/18 1015 .  traMADol (ULTRAM) tablet 75 mg, 75 mg, Oral, Q8H PRN, Mansy, Jan A, MD, 75 mg at 08/11/18 0530 .  vitamin C (ASCORBIC ACID) tablet 500 mg, 500 mg, Oral, BID, Charlett Nose, RPH, 500 mg at 08/16/18 1016 .  zolpidem (AMBIEN) tablet 5 mg, 5 mg, Oral, QHS PRN,MR X 1, Mansy, Jan A, MD, 5 mg at 08/11/18 2257    ALLERGIES   Dust mite extract    REVIEW OF SYSTEMS     10 system ROS conducted and is negative except as per subjective findings  PHYSICAL EXAMINATION   Vitals:   08/16/18 0600 08/16/18 0800  BP: 106/67   Pulse: 81   Resp: 14   Temp:  36.8 C  SpO2: 98%     GENERAL:  No apparent distress HEAD: Normocephalic, atraumatic.  EYES: Pupils equal, round, reactive to light.  No scleral icterus.  MOUTH: Moist mucosal membrane. NECK: Supple. No thyromegaly. No nodules. No JVD.  PULMONARY: To auscultation bilaterally CARDIOVASCULAR: S1 and S2. Regular rate and rhythm. No murmurs, rubs, or gallops.  GASTROINTESTINAL: Soft, nontender, non-distended. No masses. Positive bowel sounds. No hepatosplenomegaly.  MUSCULOSKELETAL: No swelling, clubbing, or edema.  NEUROLOGIC: Mild distress due to acute illness SKIN:intact,warm,dry   LABS AND IMAGING     LAB RESULTS: Recent Labs  Lab 08/14/18 1946 08/15/18 0406 08/16/18 0413  NA 137 138 138  K 4.5 3.7 3.8  CL 98 98 101  CO2 25 27 26   BUN 65* 36* 50*  CREATININE 10.78* 7.03* 9.31*  GLUCOSE 92 77 122*   Recent Labs  Lab 08/14/18 1946 08/15/18 0406 08/16/18 0413  HGB 8.9* 8.9* 8.9*  HCT 28.9* 28.5* 27.7*  WBC 8.9 9.0 8.3  PLT 215 203 184     IMAGING RESULTS: No results found.    ASSESSMENT AND PLAN    ACS -NSTEMI -transferring to medical floor Cardiology on case - appreciate input-maximizing medical therapy-Ranexa 500bid, Imdur 60 daily -Status post left heart cath-   Mid RCA lesion is 100% stenosed.  Ost Cx to Prox Cx lesion is 100% stenosed.  Mid LM lesion is 85% stenosed.          Prox LAD lesion is 85% stenosed. -follow up cardiac enzymes as indicated ICU monitoring   Acute blood loss anemia -resolved - Hematochezia - likely related to oral anticoagulant agent-resolved - H/H trending -PRBC transfusion with goal Hb>8 due to active CAD - GI evaluation-poss EGD/Cscope post cardio clearance -abd distension with constipation -s/p bowel regimen    AcuteHypoxic Respiratory Failure -continue supportive measures - [place] O2 goal O2 saturation 90%-noncompliant with cpap - currenlty on 4Lnc -continue Bronchodilator Therapy     Renal Failure-CKD4 -Nephrology on  case - appreciate input -for HD in am   ID -continue IV abx as prescibed-nasal MRSA + -follow up cultures   GI/Nutrition GI PROPHYLAXIS as indicated DIET-->TF's as tolerated Constipation protocol as indicated   ENDO - ICU hypoglycemic\Hyperglycemia protocol -check FSBS per protocol   ELECTROLYTES -follow labs as needed -replace as needed -pharmacy consultation   DVT/GI PRX ordered -SCDs  TRANSFUSIONS AS NEEDED MONITOR FSBS ASSESS the need for LABS as needed   This document was prepared using Dragon voice recognition software and may include unintentional dictation errors.    Ottie Glazier, M.D.  Division of [place]

## 2018-08-16 NOTE — Progress Notes (Signed)
Patient has remained alert and oriented x 4 with no complaints of pain this shift.  Patient has remained Normal Sinus Rhythm with a 1st degree heart block.  His BP has been running low.  Heart rate has been within normal limits.   Dr. Clayborn Bigness said to give all scheduled cardiac medications and half of dose of Coreg. Patient has remained on his baseline of 4L nasal cannula.  His lung sounds are clear / diminished. SPO2 have been over 90%.  Heparin is currently @ 8.  Patient has orders to transfer to telemetry.   Phillis Knack, RN

## 2018-08-17 ENCOUNTER — Ambulatory Visit: Payer: Medicare Other | Admitting: Physician Assistant

## 2018-08-17 LAB — CBC
HCT: 27.3 % — ABNORMAL LOW (ref 39.0–52.0)
Hemoglobin: 8.6 g/dL — ABNORMAL LOW (ref 13.0–17.0)
MCH: 30.4 pg (ref 26.0–34.0)
MCHC: 31.5 g/dL (ref 30.0–36.0)
MCV: 96.5 fL (ref 80.0–100.0)
Platelets: 203 10*3/uL (ref 150–400)
RBC: 2.83 MIL/uL — ABNORMAL LOW (ref 4.22–5.81)
RDW: 15 % (ref 11.5–15.5)
WBC: 9 10*3/uL (ref 4.0–10.5)
nRBC: 0.2 % (ref 0.0–0.2)

## 2018-08-17 LAB — HEPARIN LEVEL (UNFRACTIONATED): Heparin Unfractionated: 0.43 IU/mL (ref 0.30–0.70)

## 2018-08-17 MED ORDER — TRAMADOL HCL 50 MG PO TABS: 75.0000 mg | ORAL_TABLET | Freq: Three times a day (TID) | ORAL | Status: DC | PRN

## 2018-08-17 MED ORDER — CALCIUM ACETATE (PHOS BINDER) 667 MG PO CAPS
667.0000 mg | ORAL_CAPSULE | Freq: Three times a day (TID) | ORAL | Status: DC
  Administered 2018-08-17 – 2018-08-18 (×2): 667 mg via ORAL
  Filled 2018-08-17 (×4): qty 1

## 2018-08-17 MED ORDER — HEPARIN SODIUM (PORCINE) 5000 UNIT/ML IJ SOLN
5000.0000 [IU] | Freq: Three times a day (TID) | INTRAMUSCULAR | Status: DC
  Administered 2018-08-17 – 2018-08-18 (×2): 5000 [IU] via SUBCUTANEOUS
  Filled 2018-08-17 (×3): qty 1

## 2018-08-17 MED ORDER — SEVELAMER CARBONATE 800 MG PO TABS
800.0000 mg | ORAL_TABLET | Freq: Three times a day (TID) | ORAL | Status: DC
  Administered 2018-08-17 – 2018-08-18 (×3): 800 mg via ORAL
  Filled 2018-08-17 (×2): qty 1

## 2018-08-17 NOTE — Care Management Important Message (Signed)
Important Message  Patient Details  Name: [name] MRN: [id number] Date of Birth: 04-20-1956   Medicare Important Message Given:  Yes    Elza Rafter, RN 08/17/2018, 11:58 AM

## 2018-08-17 NOTE — Progress Notes (Signed)
HD Tx End   08/17/18 1345  Vital Signs  Pulse Rate 80  Resp 14  BP 113/69  Oxygen Therapy  SpO2 100 %  During Hemodialysis Assessment  Blood Flow Rate (mL/min) 200 mL/min  Arterial Pressure (mmHg) -160 mmHg  Venous Pressure (mmHg) 140 mmHg  Transmembrane Pressure (mmHg) 60 mmHg  Ultrafiltration Rate (mL/min) 430 mL/min  Dialysate Flow Rate (mL/min) 800 ml/min  Conductivity: Machine  14  HD Safety Checks Performed Yes  KECN 79 KECN  Dialysis Fluid Bolus Normal Saline  Bolus Amount (mL) 250 mL  Intra-Hemodialysis Comments Tx completed (pt stable)

## 2018-08-17 NOTE — Progress Notes (Signed)
ANTICOAGULATION CONSULT NOTE  Pharmacy Consult for heparin Indication: chest pain/ACS  Patient Measurements: Height: 5\' 8"  (172.7 cm) Weight: 209 lb 1.6 oz (94.8 kg) IBW/kg (Calculated) : 68.4 Heparin Dosing Weight: 90.4 kg  Vital Signs: Temp: 99.3 F (37.4 C) (03/23 0446) Temp Source: Oral (03/23 0446) BP: 117/84 (03/23 0446) Pulse Rate: 89 (03/23 0446)  Labs: Recent Labs    08/14/18 1946 08/15/18 0406 08/15/18 0954 08/15/18 1929 08/16/18 0413 08/16/18 1317 08/16/18 2201 08/17/18 0433 08/17/18 0558  HGB 8.9* 8.9*  --   --  8.9*  --   --  8.6*  --   HCT 28.9* 28.5*  --   --  27.7*  --   --  27.3*  --   PLT 215 203  --   --  184  --   --  203  --   APTT 81*  --   --  89* 107* 81*  --   --   --   HEPARINUNFRC 1.10*  --   --  0.77* 0.83* 0.68 0.48  --  0.43  CREATININE 10.78* 7.03*  --   --  9.31*  --   --   --   --   TROPONINI 5.29* 4.57* 4.21*  --   --   --   --   --   --     Estimated Creatinine Clearance: 9 mL/min (A) (by C-G formula based on SCr of 9.31 mg/dL (H)).   Medical History: Past Medical History:  Diagnosis Date  . 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 Amputee, below knee, left ([place])   . Anemia   . Asthma   . Cardiomyopathy ([place])   . CHF (congestive heart failure) ([place])   . Chronic systolic heart failure ([place])   . Complication of anesthesia    hypotension  . COPD (chronic obstructive pulmonary disease) ([place])   . Coronary artery disease   . Dialysis patient ([place])    Mon, Wed, Fri  . End stage renal disease ([place])   . GERD (gastroesophageal reflux disease)   . Headache   . History of kidney stones   . History of pulmonary embolism   . HLD (hyperlipidemia)   . HTN (hypertension)   . Hyperparathyroidism   . Myocardial infarction ([place])   . Peripheral vascular disease ([place])   . Shortness of breath dyspnea   . Sleep apnea    NO C-PAP, Patient stated in process of  "getting one"   . Tobacco dependence     Medications:   Scheduled:  . aspirin EC  81 mg Oral Daily  . atorvastatin  80 mg Oral q1800  . bisacodyl  10 mg Rectal Once  . carvedilol  3.125 mg Oral BID  . Chlorhexidine Gluconate Cloth  6 each Topical Q0600  . epoetin (EPOGEN/PROCRIT) injection  4,000 Units Intravenous Q M,W,F-HD  . erythromycin  1 application Right Eye QHS  . feeding supplement (NEPRO CARB STEADY)  237 mL Oral BID BM  . fluticasone  1 spray Each Nare Daily  . isosorbide mononitrate  60 mg Oral Daily  . loratadine  10 mg Oral Daily  . mouth rinse  15 mL Mouth Rinse BID  . midodrine  10 mg Oral Q8H  . multivitamin  1 tablet Oral QHS  . pantoprazole (PROTONIX) IV  40 mg Intravenous Q12H  . pregabalin  75 mg Oral Daily  . ranolazine  500 mg Oral BID  . senna-docusate  2 tablet  Oral BID  . tiotropium  18 mcg Inhalation Daily  . vitamin C  500 mg Oral BID    Assessment: Patient admitted for CP from nursing facility, trops 1.89. Patient takes eliquis 5 mg bid PTA (unclear why) and unsure when last dose was.  Patient was started on heparin drip for UA/ACS Baseline labs showed HL >3.6, hgb 7.6, PT/INR 15.9/1.3 Patient had cardiac catheterization (3/17) showing occluded proximal right coronary artery left circumflex artery and significant stenosis of distal left main and LAD at 85%.   Heparin Course 3/16 initiated at 1000 units/hr 3/17 0453 aPTT 114s: rate reduced to 850 units/hr 3/17 2019 aPTT 60s: rate increased to 900 units/hr 3/18 2249 aPTT 69s: rate increased to 950 units/hr 3/22 0413 aPTT 107s: rate reduced to 800 units/hr 3/22 1317 aPTT 81s, HL 0.68 3/22 2201 aPTT HL: 0.48- Level therapeutic x 2  Goal of Therapy:  APTT 66 - 102 seconds Heparin level 0.3-0.7 units/ml Monitor platelets by anticoagulation protocol: Yes   Plan:  3/23 @ 0558 HL 0.43. Level remains therapeutic.   Will continue heparin infusion at 800 units/hr  Will continue to check HL and CBCs daily with AM labs per protocol.   Pharmacy will continue  to monitor and adjust per consult.   Pernell Dupre, PharmD, BCPS Clinical Pharmacist 08/17/2018 6:57 AM

## 2018-08-17 NOTE — Progress Notes (Signed)
[facility] NAME: [name]    MR#:  [id number]  DATE OF BIRTH:  03-25-1956  SUBJECTIVE:  CHIEF COMPLAINT:   Chief Complaint  Patient presents with  . Chest Pain   Came with chest pain.  Noted to have low hemoglobin and guaiac positive stool.  Started on heparin drip and admitted to ICU.  Cardiac catheterization showed significant disease.  Followed by stress test.  GI has signed off.  Cardiology wants to manage medically.  No chest pain today. SOB improving On 2 L O2  REVIEW OF SYSTEMS:  CONSTITUTIONAL: No fever, fatigue or weakness.  EYES: No blurred or double vision.  EARS, NOSE, AND THROAT: No tinnitus or ear pain.  RESPIRATORY: No cough, shortness of breath, wheezing or hemoptysis.  CARDIOVASCULAR: Have chest pain, no orthopnea, edema.  GASTROINTESTINAL: No nausea, vomiting, diarrhea or abdominal pain.  GENITOURINARY: No dysuria, hematuria.  ENDOCRINE: No polyuria, nocturia,  HEMATOLOGY: No anemia, easy bruising or bleeding SKIN: No rash or lesion. MUSCULOSKELETAL: No joint pain or arthritis.   NEUROLOGIC: No tingling, numbness, weakness.  PSYCHIATRY: No anxiety or depression.   ROS  DRUG ALLERGIES:   Allergies  Allergen Reactions  . Dust Mite Extract Other (See Comments)    Reaction: unknown    VITALS:  Blood pressure 117/84, pulse 89, temperature 99.3 F (37.4 C), temperature source Oral, resp. rate 17, height 5\' 8"  (1.727 m), weight 94.8 kg, SpO2 97 %.  PHYSICAL EXAMINATION:  GENERAL:  63 y.o.-year-old patient lying in the bed .  Obese EYES: Pupils equal, round, reactive to light and accommodation. No scleral icterus. Extraocular muscles intact.  HEENT: Head atraumatic, normocephalic. Oropharynx and nasopharynx clear.  NECK:  Supple, no jugular venous distention. No thyroid enlargement, no tenderness.  LUNGS: Normal breath sounds bilaterally, no wheezing, rales,rhonchi or crepitation. No use of  accessory muscles of respiration.  CARDIOVASCULAR: S1, S2 normal. No murmurs, rubs, or gallops.  ABDOMEN: Soft, nontender, nondistended. Bowel sounds present. No organomegaly or mass.  EXTREMITIES: No pedal edema, cyanosis, or clubbing.  NEUROLOGIC: Cranial nerves II through XII are intact. Muscle strength 4/5 in all extremities. Sensation intact. Gait not checked.  PSYCHIATRIC: The patient is alert and oriented x 3.  SKIN: No obvious rash, lesion, or ulcer.   Physical Exam LABORATORY PANEL:   CBC Recent Labs  Lab 08/17/18 0433  WBC 9.0  HGB 8.6*  HCT 27.3*  PLT 203   ------------------------------------------------------------------------------------------------------------------  Chemistries  Recent Labs  Lab 08/16/18 0413  NA 138  K 3.8  CL 101  CO2 26  GLUCOSE 122*  BUN 50*  CREATININE 9.31*  CALCIUM 9.3  MG 2.4   ------------------------------------------------------------------------------------------------------------------  Cardiac Enzymes Recent Labs  Lab 08/15/18 0406 08/15/18 0954  TROPONINI 4.57* 4.21*   ------------------------------------------------------------------------------------------------------------------  RADIOLOGY:  No results found.  ASSESSMENT AND PLAN:   Active Problems:   Non-STEMI (non-ST elevated myocardial infarction) (HCC)   Acute GI bleeding  1.  None-ST elevation MI. on IV heparin per Dr. Alveria Apley recommendation while transfusing the patient 2 units of packed red blood cells for his anemia. PRN nitro. Eliquis held 2D echocardiogram with ejection fraction 20 to 25% On betablocker and statin. Significant blockages in LAD and left main coronary and occluded proximal right coronary and proximal left circumflex.  Nuclear stress test was done after this and showed significant decline in ejection fraction up to 16% and high risk study. Dr. Clayborn Bigness has advised medical management.  2.  GI bleeding with acute blood loss  anemia.   Heme positive stool.  2 units packed RBC transfused and now hemoglobin stable. GI has signed off. Patient follow-up. Monitor hemoglobin  3.  End-stage renal disease on hemodialysis likely with associated fluid overload could be related to acute on top of chronic systolic CHF.  Nephrology consultation   PhosLo and Renvela will be continued.  Cont HD per nephro inpatient.  4.  COPD.  The Spiriva will be continued and he will be placed on continued care and Combivent.  5.  Dyslipidemia.  Lipitor dose will be increased .  6.  Hypertension.  His Coreg will be resumed.  We will monitor his blood pressure given usual hypotension.  7.  DVT prophylaxis.   IV heparin drip   All the records are reviewed and case discussed with Care Management/Social Worker Management plans discussed with the patient, family and they are in agreement.  CODE STATUS: Full  TOTAL TIME TAKING CARE OF THIS PATIENT: 35 minutes.   Neita Carp M.D on 08/17/2018   Between 7am to 6pm - Pager - [phone]  After 6pm go to [email or web address] - password [email or web address] [facility]  Office  [phone]  CC: Primary care physician; Donnie Coffin, MD  Note: This dictation was prepared with Dragon dictation along with smaller phrase technology. Any transcriptional errors that result from this process are unintentional.

## 2018-08-17 NOTE — Progress Notes (Signed)
Pt off the floor for HD. Report given to [facility]

## 2018-08-17 NOTE — Progress Notes (Signed)
Post HD Tx   08/17/18 1354  Hand-Off documentation  Report given to (Full Name) Stacie Glaze RN  Report received from (Full Name) Trellis Paganini RN  Vital Signs  Temp 98 F (36.7 C)  Temp Source Oral  Pulse Rate 80  Pulse Rate Source Monitor  Resp 14  BP 115/78  BP Location Right Arm  BP Method Automatic  Patient Position (if appropriate) Lying  Oxygen Therapy  SpO2 100 %  O2 Device Room Air  Pain Assessment  Pain Scale 0-10  Pain Score 0  Dialysis Weight  Weight 93.8 kg  Type of Weight Post-Dialysis  Post-Hemodialysis Assessment  Rinseback Volume (mL) 250 mL  KECN 79 V  Dialyzer Clearance Lightly streaked  Duration of HD Treatment -hour(s) 3.5 hour(s)  Hemodialysis Intake (mL) 500 mL  UF Total -Machine (mL) 1500 mL  Net UF (mL) 1000 mL  Tolerated HD Treatment Yes  AVG/AVF Arterial Site Held (minutes) 10 minutes  AVG/AVF Venous Site Held (minutes) 10 minutes  Fistula / Graft Left Upper arm Arteriovenous fistula  No Placement Date or Time found.   Placed prior to admission: Yes  Orientation: Left  Access Location: Upper arm  Access Type: Arteriovenous fistula  Site Condition No complications  Fistula / Graft Assessment Present;Thrill;Bruit  Status Deaccessed  Drainage Description None

## 2018-08-17 NOTE — TOC Progression Note (Signed)
Transition of Care [facility]) - Progression Note    Patient Details  Name: TAB RYLEE MRN: [id number] Date of Birth: 28-Dec-1955  Transition of Care [facility]) CM/SW Contact  Ross Ludwig, [place] Phone Number: 08/17/2018, 5:17 PM  Clinical Narrative:    CSW spoke with [facility], they said patient should be able to return back to ALF once he is medically ready for discharge, they are just requesting copy of discharge summary and FL2 once he is ready for discharge.   Expected Discharge Plan: Assisted Living(with home health care)    Expected Discharge Plan and Services Expected Discharge Plan: Assisted Living(with home health care)       Living arrangements for the past 2 months: [place]                           Social Determinants of Health (SDOH) Interventions    Readmission Risk Interventions Readmission Risk Prevention Plan 04/24/2018  Transportation Screening Complete  Medication Review Press photographer) Complete  PCP or Specialist appointment within 3-5 days of discharge Complete  HRI or Home Care Consult Complete  SW Recovery Care/Counseling Consult Complete  Palliative Care Screening Not Complete  Medication Reconcilation (Pharmacy) Complete  [place] Not Complete  Some recent data might be hidden

## 2018-08-17 NOTE — Progress Notes (Signed)
Pre HD Tx   08/17/18 0945  Hand-Off documentation  Report given to (Full Name) Trellis Paganini RN  Report received from (Full Name) Stacie Glaze RN  Vital Signs  Temp 98.8 F (37.1 C)  Temp Source Oral  Pulse Rate 86  Pulse Rate Source Monitor  Resp 18  BP 110/71  Oxygen Therapy  SpO2 100 %  O2 Device Nasal Cannula  O2 Flow Rate (L/min) 3 L/min  Pain Assessment  Pain Scale 0-10  Pain Score 0  Dialysis Weight  Weight 94.8 kg  Type of Weight Pre-Dialysis  Time-Out for Hemodialysis  What Procedure? Hemodialysis  Pt Identifiers(min of two) First/Last Name;MRN/Account#  Correct Site? Yes  Correct Side? Yes  Correct Procedure? Yes  Consents Verified? Yes  Rad Studies Available? N/A  Safety Precautions Reviewed? Yes  Engineer, civil (consulting) Number 2  Station Number 1  UF/Alarm Test Passed  Conductivity: Meter 13.8  Conductivity: Machine  14  pH 7.2  Reverse Osmosis main  Normal Saline Lot Number [id number]  Dialyzer Lot Number [id number]  Disposable Set Lot Number [id number]  Machine Temperature 98.6 F (37 C)  Musician and Audible Yes  Blood Lines Intact and Secured Yes  Pre Treatment Patient Checks  Vascular access used during treatment Fistula  Hepatitis B Surface Antigen Results Negative  Date Hepatitis B Surface Antigen Drawn 11/17/17  Hepatitis B Surface Antibody  (>10)  Date Hepatitis B Surface Antibody Drawn 09/27/17  Hemodialysis Consent Verified Yes  Hemodialysis Standing Orders Initiated Yes  ECG (Telemetry) Monitor On Yes  Prime Ordered Normal Saline  Length of  DialysisTreatment -hour(s) 3.5 Hour(s)  Dialysis Treatment Comments Na 140  Dialyzer Elisio 17H NR  Dialysate 3K, 2.5 Ca  Dialysate Flow Ordered 800  Blood Flow Rate Ordered 400 mL/min  Ultrafiltration Goal 1 Liters  Dialysis Blood Pressure Support Ordered Normal Saline  Education / Care Plan  Dialysis Education Provided Yes  Documented Education in Care Plan Yes  Fistula / Graft Left  Upper arm Arteriovenous fistula  No Placement Date or Time found.   Placed prior to admission: Yes  Orientation: Left  Access Location: Upper arm  Access Type: Arteriovenous fistula  Site Condition No complications  Fistula / Graft Assessment Present;Thrill;Bruit  Status Accessed  Needle Size 15  Drainage Description None

## 2018-08-17 NOTE — Progress Notes (Signed)
[name]    MR#:  [id number]  DATE OF BIRTH:  05-04-1956  SUBJECTIVE:  CHIEF COMPLAINT:   Chief Complaint  Patient presents with  . Chest Pain   Came with chest pain.  Noted to have low hemoglobin and guaiac positive stool.  Started on heparin drip and admitted to ICU.  Cardiac catheterization showed significant disease.  Followed by stress test.  GI has signed off.  Cardiology wants to manage medically.  Waiting for HD.  REVIEW OF SYSTEMS:  CONSTITUTIONAL: No fever, fatigue or weakness.  EYES: No blurred or double vision.  EARS, NOSE, AND THROAT: No tinnitus or ear pain.  RESPIRATORY: No cough, shortness of breath, wheezing or hemoptysis.  CARDIOVASCULAR: Have chest pain, no orthopnea, edema.  GASTROINTESTINAL: No nausea, vomiting, diarrhea or abdominal pain.  GENITOURINARY: No dysuria, hematuria.  ENDOCRINE: No polyuria, nocturia,  HEMATOLOGY: No anemia, easy bruising or bleeding SKIN: No rash or lesion. MUSCULOSKELETAL: No joint pain or arthritis.   NEUROLOGIC: No tingling, numbness, weakness.  PSYCHIATRY: No anxiety or depression.   ROS  DRUG ALLERGIES:   Allergies  Allergen Reactions  . Dust Mite Extract Other (See Comments)    Reaction: unknown    VITALS:  Blood pressure 118/72, pulse 88, temperature 98.6 F (37 C), temperature source Oral, resp. rate 20, height 5\' 8"  (1.727 m), weight 94.8 kg, SpO2 100 %.  PHYSICAL EXAMINATION:  GENERAL:  63 y.o.-year-old patient lying in the bed .  Obese EYES: Pupils equal, round, reactive to light and accommodation. No scleral icterus. Extraocular muscles intact.  HEENT: Head atraumatic, normocephalic. Oropharynx and nasopharynx clear.  NECK:  Supple, no jugular venous distention. No thyroid enlargement, no tenderness.  LUNGS: Normal breath sounds bilaterally, no wheezing, rales,rhonchi or crepitation. No use of accessory muscles of respiration.   CARDIOVASCULAR: S1, S2 normal. No murmurs, rubs, or gallops.  ABDOMEN: Soft, nontender, nondistended. Bowel sounds present. No organomegaly or mass.  EXTREMITIES: No pedal edema, cyanosis, or clubbing.  NEUROLOGIC: Cranial nerves II through XII are intact. Muscle strength 4/5 in all extremities. Sensation intact. Gait not checked.  Left BKA PSYCHIATRIC: The patient is alert and oriented x 3.  SKIN: No obvious rash, lesion, or ulcer.   Physical Exam LABORATORY PANEL:   CBC Recent Labs  Lab 08/17/18 0433  WBC 9.0  HGB 8.6*  HCT 27.3*  PLT 203   ------------------------------------------------------------------------------------------------------------------  Chemistries  Recent Labs  Lab 08/16/18 0413  NA 138  K 3.8  CL 101  CO2 26  GLUCOSE 122*  BUN 50*  CREATININE 9.31*  CALCIUM 9.3  MG 2.4   ------------------------------------------------------------------------------------------------------------------  Cardiac Enzymes Recent Labs  Lab 08/15/18 0406 08/15/18 0954  TROPONINI 4.57* 4.21*   ------------------------------------------------------------------------------------------------------------------  RADIOLOGY:  No results found.  ASSESSMENT AND PLAN:   Active Problems:   Non-STEMI (non-ST elevated myocardial infarction) (HCC)   Acute GI bleeding  1.  None-ST elevation MI. on IV heparin per Dr. Alveria [name] recommendation while transfusing the patient 2 units of packed red blood cells for his anemia. PRN nitro. Eliquis held 2D echocardiogram with ejection fraction 20 to 25% On betablocker and statin. Significant blockages in LAD and left main coronary and occluded proximal right coronary and proximal left circumflex.  Nuclear stress test was done after this and showed significant decline in ejection fraction up to 16% and high risk study. Dr. Clayborn [name] has advised medical management. ON and off chest pain. Cardiac rehab after  cleared by cardiology  as OP. Stop heparin drip  2.  GI bleeding with acute blood loss anemia.   Heme positive stool.  2 units packed RBC transfused and now hemoglobin stable. GI has signed off. Patient follow-up. Monitor hemoglobin  3.  End-stage renal disease on hemodialysis likely with associated fluid overload could be related to acute on top of chronic systolic CHF.   Nephrology following  PhosLo and Renvela will be continued.  Cont HD per nephro inpatient.  4.  COPD.   Spiriva and combivent  5.  Dyslipidemia.  Lipitor dose  increased .  6.  Hypertension.  Coreg resumed  7.  DVT prophylaxis. Start heparin SQ  H/o DVT/PE in 2018 provoked by prolonged hospital stay. Will stop due to GI bleed   All the records are reviewed and case discussed with Care Management/Social Worker Management plans discussed with the patient, family and they are in agreement.  CODE STATUS: Full  TOTAL TIME TAKING CARE OF THIS PATIENT: 35 minutes.   [name] M.D on 08/17/2018   Between 7am to 6pm - Pager - [phone]  After 6pm go to [email or web address] - password [email or web address]  Office  [phone]  CC: Primary care physician; Joel Coffin, MD  Note: This dictation was prepared with Dragon dictation along with smaller phrase technology. Any transcriptional errors that result from this process are unintentional.

## 2018-08-17 NOTE — Progress Notes (Signed)
Post HD Assessment  Pt tolerated dialysis well; removed 1kg of fluid.     08/17/18 1345  Neurological  Level of Consciousness Alert  Orientation Level Oriented X4  Respiratory  Respiratory Pattern Regular  Chest Assessment Chest expansion symmetrical  Bilateral Breath Sounds Diminished  Cardiac  Pulse Regular  Heart Sounds S1, S2  Jugular Venous Distention (JVD) No  ECG Monitor Yes  Antiarrhythmic device No  Vascular  R Radial Pulse +2  L Radial Pulse +2  R Dorsalis Pedis Pulse +1  L Dorsalis Pedis Pulse +1  Integumentary  Integumentary (WDL) X  Skin Condition Dry;Flaky  Musculoskeletal  Musculoskeletal (WDL) X  Generalized Weakness Yes  Gastrointestinal  Bowel Sounds Assessment Active  GU Assessment  Genitourinary (WDL) X  Genitourinary Symptoms Anuria  Psychosocial  Psychosocial (WDL) WDL

## 2018-08-17 NOTE — Progress Notes (Signed)
[facility]  ROUNDING NOTE   Subjective:   Denies chest pain.  Seen and examined on hemodialysis treatment. Tolerating treatment well. UF of 1 liter  Objective:  Vital signs in last 24 hours:  Temp:  [98.5 F (36.9 C)-99.3 F (37.4 C)] 98.8 F (37.1 C) (03/23 0945) Pulse Rate:  [81-89] 81 (03/23 1230) Resp:  [12-23] 12 (03/23 1230) BP: (99-121)/(66-90) 107/69 (03/23 1230) SpO2:  [97 %-100 %] 100 % (03/23 1230) Weight:  [94.8 kg] 94.8 kg (03/23 0945)  Weight change: -0.753 kg Filed Weights   08/16/18 0500 08/17/18 0446 08/17/18 0945  Weight: 95.6 kg 94.8 kg 94.8 kg    Intake/Output: I/O last 3 completed shifts: In: 365.8 [I.V.:365.8] Out: 0    Intake/Output this shift:  Total I/O In: 240 [P.O.:240] Out: -   Physical Exam: General: No acute distress, laying in bed  Head: Normocephalic, atraumatic. Moist oral mucosal membranes  Eyes: Anicteric  Neck: Supple, trachea midline  Lungs:  Clear to auscultation, normal effort  Heart: S1S2 no rubs  Abdomen:  Soft, nontender, bowel sounds present  Extremities: No peripheral edema, L BKA  Neurologic: Awake, alert, following commands  Skin: No lesions  Access: LUE AVF    Basic Metabolic Panel: Recent Labs  Lab 08/12/18 0613 08/12/18 1417 08/12/18 2249 08/14/18 1946 08/15/18 0406 08/16/18 0413  NA 139  --  138 137 138 138  K 5.1  --  4.0 4.5 3.7 3.8  CL 100  --  99 98 98 101  CO2 27  --  30 25 27 26   GLUCOSE 110*  --  115* 92 77 122*  BUN 57*  --  36* 65* 36* 50*  CREATININE 8.81*  --  6.74* 10.78* 7.03* 9.31*  CALCIUM 9.1  --  8.7* 9.0 8.8* 9.3  MG  --   --   --   --  2.1 2.4  PHOS 5.6* 3.5 4.5 6.2* 4.0 5.1*    Liver Function Tests: Recent Labs  Lab 08/12/18 0613 08/12/18 2249 08/14/18 1946  ALBUMIN 3.0* 2.7* 2.9*   No results for input(s): LIPASE, AMYLASE in the last 168 hours. No results for input(s): AMMONIA in the last 168 hours.  CBC: Recent Labs  Lab 08/13/18 0412  08/14/18 1946 08/15/18 0406 08/16/18 0413 08/17/18 0433  WBC 8.2 8.9 9.0 8.3 9.0  HGB 9.3* 8.9* 8.9* 8.9* 8.6*  HCT 29.8* 28.9* 28.5* 27.7* 27.3*  MCV 97.7 98.0 96.9 95.5 96.5  PLT 185 215 203 184 203    Cardiac Enzymes: Recent Labs  Lab 08/13/18 1213 08/13/18 1855 08/14/18 1946 08/15/18 0406 08/15/18 0954  TROPONINI 7.08* 6.33* 5.29* 4.57* 4.21*    BNP: Invalid input(s): POCBNP  CBG: Recent Labs  Lab 08/12/18 1936  GLUCAP 4    Microbiology: Results for orders placed or performed during the hospital encounter of 08/10/18  MRSA PCR Screening     Status: Abnormal   Collection Time: 08/10/18  6:54 AM  Result Value Ref Range Status   MRSA by PCR POSITIVE (A) NEGATIVE Final    Comment:        The GeneXpert MRSA Assay (FDA approved for NASAL specimens only), is one component of a comprehensive MRSA colonization surveillance program. It is not intended to diagnose MRSA infection nor to guide or monitor treatment for MRSA infections. RESULT CALLED TO, READ BACK BY AND VERIFIED WITH:  [facility] [place] AT 8185 08/10/18 KM/SDR Performed at [facility], [address], [place], [place] [address]  Coagulation Studies: No results for input(s): LABPROT, INR in the last 72 hours.  Urinalysis: No results for input(s): COLORURINE, LABSPEC, PHURINE, GLUCOSEU, HGBUR, BILIRUBINUR, KETONESUR, PROTEINUR, UROBILINOGEN, NITRITE, LEUKOCYTESUR in the last 72 hours.  Invalid input(s): APPERANCEUR    Imaging: No results found.   Medications:    . aspirin EC  81 mg Oral Daily  . atorvastatin  80 mg Oral q1800  . bisacodyl  10 mg Rectal Once  . carvedilol  3.125 mg Oral BID  . Chlorhexidine Gluconate Cloth  6 each Topical Q0600  . epoetin (EPOGEN/PROCRIT) injection  4,000 Units Intravenous Q M,W,F-HD  . erythromycin  1 application Right Eye QHS  . feeding supplement (NEPRO CARB STEADY)  237 mL Oral BID BM  . fluticasone  1 spray Each Nare Daily  .  isosorbide mononitrate  60 mg Oral Daily  . loratadine  10 mg Oral Daily  . mouth rinse  15 mL Mouth Rinse BID  . midodrine  10 mg Oral Q8H  . multivitamin  1 tablet Oral QHS  . pantoprazole (PROTONIX) IV  40 mg Intravenous Q12H  . pregabalin  75 mg Oral Daily  . ranolazine  500 mg Oral BID  . senna-docusate  2 tablet Oral BID  . tiotropium  18 mcg Inhalation Daily  . vitamin C  500 mg Oral BID   acetaminophen, albuterol, guaiFENesin, ipratropium-albuterol, morphine injection, nitroGLYCERIN, ondansetron (ZOFRAN) IV, polyethylene glycol, traMADol, zolpidem  Assessment/ Plan:  63 y.o. male with ESRD on hemodialysis, hypertension, hyperlipidemia, anemia, COPD, peripheral vascular disease, left BKA, right toe amputations, severe coronary artery disease admitted on 08/10/2018 for acute coronary syndrome.   MWF [facility] Nephrology [facility].   1.  End-stage renal disease MWF Seen and examined on hemodialysis treatment  2. Hypotension: midodrine Has carvedilol and isosorbide ordered  3. Anemia of chronic kidney disease: hemoglobin 8.6 - EPO with HD treatment  4. Secondary Hyperparathyroidism: phosphorus 5.1. Home regimen seems to be calcium acetate 1 with each meal and sevelamer 1 with each meal.    LOS: 7 [name] 3/23/202012:52 PM

## 2018-08-17 NOTE — Progress Notes (Signed)
Pre HD Assessment   08/17/18 0935  Neurological  Level of Consciousness Alert  Orientation Level Oriented X4  Respiratory  Respiratory Pattern Regular  Chest Assessment Chest expansion symmetrical  Bilateral Breath Sounds Diminished  Cardiac  Pulse Regular  Heart Sounds S1, S2  Jugular Venous Distention (JVD) No  ECG Monitor Yes  Antiarrhythmic device No  Vascular  R Radial Pulse +2  L Radial Pulse +2  R Dorsalis Pedis Pulse +1  L Dorsalis Pedis Pulse +1  Integumentary  Integumentary (WDL) X  Skin Condition Dry;Flaky  Musculoskeletal  Musculoskeletal (WDL) X  Generalized Weakness Yes  Gastrointestinal  Bowel Sounds Assessment Active  GU Assessment  Genitourinary (WDL) X  Genitourinary Symptoms Anuria  Psychosocial  Psychosocial (WDL) WDL

## 2018-08-17 NOTE — Progress Notes (Signed)
HD Tx Start   08/17/18 1015  Vital Signs  Pulse Rate 86  Pulse Rate Source Monitor  Resp (!) 23  BP 110/73  BP Location Right Arm  BP Method Automatic  Patient Position (if appropriate) Lying  Oxygen Therapy  SpO2 99 %  O2 Device Nasal Cannula  O2 Flow Rate (L/min) 3 L/min  During Hemodialysis Assessment  Blood Flow Rate (mL/min) 400 mL/min  Arterial Pressure (mmHg) -140 mmHg  Venous Pressure (mmHg) 180 mmHg  Transmembrane Pressure (mmHg) 40 mmHg  Ultrafiltration Rate (mL/min) 430 mL/min (430 mL per HOUR)  Dialysate Flow Rate (mL/min) 800 ml/min  Conductivity: Machine  14  HD Safety Checks Performed Yes  Dialysis Fluid Bolus Normal Saline  Bolus Amount (mL) 250 mL  Intra-Hemodialysis Comments Tx initiated

## 2018-08-18 ENCOUNTER — Ambulatory Visit: Payer: Medicare Other | Admitting: Physician Assistant

## 2018-08-18 LAB — CBC
HCT: 28.6 % — ABNORMAL LOW (ref 39.0–52.0)
Hemoglobin: 9.1 g/dL — ABNORMAL LOW (ref 13.0–17.0)
MCH: 30.6 pg (ref 26.0–34.0)
MCHC: 31.8 g/dL (ref 30.0–36.0)
MCV: 96.3 fL (ref 80.0–100.0)
Platelets: 222 10*3/uL (ref 150–400)
RBC: 2.97 MIL/uL — ABNORMAL LOW (ref 4.22–5.81)
RDW: 15.2 % (ref 11.5–15.5)
WBC: 9 10*3/uL (ref 4.0–10.5)
nRBC: 0.7 % — ABNORMAL HIGH (ref 0.0–0.2)

## 2018-08-18 MED ORDER — PANTOPRAZOLE SODIUM 40 MG PO TBEC: 40.0000 mg | DELAYED_RELEASE_TABLET | Freq: Every day | ORAL | 0 refills | Status: DC

## 2018-08-18 MED ORDER — ASPIRIN 325 MG PO TBEC: 325.0000 mg | DELAYED_RELEASE_TABLET | Freq: Every day | ORAL | 0 refills | Status: DC

## 2018-08-18 NOTE — Progress Notes (Signed)
[facility]  ROUNDING NOTE   Subjective:   Hemodialysis treatment yesterday. Tolerated treatment well. UF of 1 liter  Objective:  Vital signs in last 24 hours:  Temp:  [97.8 F (36.6 C)-99.6 F (37.6 C)] 99 F (37.2 C) (03/24 0744) Pulse Rate:  [80-88] 85 (03/24 0744) Resp:  [12-23] 18 (03/24 0604) BP: (95-120)/(66-90) 106/77 (03/24 0744) SpO2:  [99 %-100 %] 100 % (03/24 0744) Weight:  [93.8 kg] 93.8 kg (03/24 0640)  Weight change: -0.047 kg Filed Weights   08/17/18 0945 08/17/18 1354 08/18/18 0640  Weight: 94.8 kg 93.8 kg 93.8 kg    Intake/Output: I/O last 3 completed shifts: In: 578.1 [P.O.:240; I.V.:98.1; NG/GT:240] Out: 1000 [Other:1000]   Intake/Output this shift:  Total I/O In: 240 [P.O.:240] Out: -   Physical Exam: General: No acute distress, laying in bed  Head: Normocephalic, atraumatic. Moist oral mucosal membranes  Eyes: Anicteric  Neck: Supple, trachea midline  Lungs:  Clear to auscultation, normal effort  Heart: regular  Abdomen:  Soft, nontender, bowel sounds present  Extremities: No peripheral edema, L BKA  Neurologic: Awake, alert, following commands  Skin: No lesions  Access: LUE AVF    Basic Metabolic Panel: Recent Labs  Lab 08/12/18 0613 08/12/18 1417 08/12/18 2249 08/14/18 1946 08/15/18 0406 08/16/18 0413  NA 139  --  138 137 138 138  K 5.1  --  4.0 4.5 3.7 3.8  CL 100  --  99 98 98 101  CO2 27  --  30 25 27 26   GLUCOSE 110*  --  115* 92 77 122*  BUN 57*  --  36* 65* 36* 50*  CREATININE 8.81*  --  6.74* 10.78* 7.03* 9.31*  CALCIUM 9.1  --  8.7* 9.0 8.8* 9.3  MG  --   --   --   --  2.1 2.4  PHOS 5.6* 3.5 4.5 6.2* 4.0 5.1*    Liver Function Tests: Recent Labs  Lab 08/12/18 0613 08/12/18 2249 08/14/18 1946  ALBUMIN 3.0* 2.7* 2.9*   No results for input(s): LIPASE, AMYLASE in the last 168 hours. No results for input(s): AMMONIA in the last 168 hours.  CBC: Recent Labs  Lab 08/14/18 1946 08/15/18 0406  08/16/18 0413 08/17/18 0433 08/18/18 0334  WBC 8.9 9.0 8.3 9.0 9.0  HGB 8.9* 8.9* 8.9* 8.6* 9.1*  HCT 28.9* 28.5* 27.7* 27.3* 28.6*  MCV 98.0 96.9 95.5 96.5 96.3  PLT 215 203 184 203 222    Cardiac Enzymes: Recent Labs  Lab 08/13/18 1213 08/13/18 1855 08/14/18 1946 08/15/18 0406 08/15/18 0954  TROPONINI 7.08* 6.33* 5.29* 4.57* 4.21*    BNP: Invalid input(s): POCBNP  CBG: Recent Labs  Lab 08/12/18 1936  GLUCAP 104    Microbiology: Results for orders placed or performed during the hospital encounter of 08/10/18  MRSA PCR Screening     Status: Abnormal   Collection Time: 08/10/18  6:54 AM  Result Value Ref Range Status   MRSA by PCR POSITIVE (A) NEGATIVE Final    Comment:        The GeneXpert MRSA Assay (FDA approved for NASAL specimens only), is one component of a comprehensive MRSA colonization surveillance program. It is not intended to diagnose MRSA infection nor to guide or monitor treatment for MRSA infections. RESULT CALLED TO, READ BACK BY AND VERIFIED WITH:  [facility] [place] AT 0321 08/10/18 KM/SDR Performed at [facility], [address] [place], [place] [address]     Coagulation Studies: No results  for input(s): LABPROT, INR in the last 72 hours.  Urinalysis: No results for input(s): COLORURINE, LABSPEC, PHURINE, GLUCOSEU, HGBUR, BILIRUBINUR, KETONESUR, PROTEINUR, UROBILINOGEN, NITRITE, LEUKOCYTESUR in the last 72 hours.  Invalid input(s): APPERANCEUR    Imaging: No results found.   Medications:    . aspirin EC  81 mg Oral Daily  . atorvastatin  80 mg Oral q1800  . bisacodyl  10 mg Rectal Once  . calcium acetate  667 mg Oral TID WC  . carvedilol  3.125 mg Oral BID  . Chlorhexidine Gluconate Cloth  6 each Topical Q0600  . epoetin (EPOGEN/PROCRIT) injection  4,000 Units Intravenous Q M,W,F-HD  . erythromycin  1 application Right Eye QHS  . feeding supplement (NEPRO CARB STEADY)  237 mL Oral BID BM  . fluticasone  1 spray  Each Nare Daily  . heparin injection (subcutaneous)  5,000 Units Subcutaneous Q8H  . isosorbide mononitrate  60 mg Oral Daily  . loratadine  10 mg Oral Daily  . mouth rinse  15 mL Mouth Rinse BID  . midodrine  10 mg Oral Q8H  . multivitamin  1 tablet Oral QHS  . pantoprazole (PROTONIX) IV  40 mg Intravenous Q12H  . pregabalin  75 mg Oral Daily  . ranolazine  500 mg Oral BID  . senna-docusate  2 tablet Oral BID  . sevelamer carbonate  800 mg Oral TID WC  . tiotropium  18 mcg Inhalation Daily  . vitamin C  500 mg Oral BID   acetaminophen, albuterol, guaiFENesin, ipratropium-albuterol, morphine injection, nitroGLYCERIN, ondansetron (ZOFRAN) IV, polyethylene glycol, traMADol, zolpidem  Assessment/ Plan:  63 y.o. male with ESRD on hemodialysis, hypertension, hyperlipidemia, anemia, COPD, peripheral vascular disease, left BKA, right toe amputations, severe coronary artery disease admitted on 08/10/2018 for acute coronary syndrome.   MWF [facility] Nephrology [facility].   1.  End-stage renal disease MWF Tolerated hemodialysis treatment yesterday.   2. Hypotension: midodrine - carvedilol and isosorbide mononitrate as cardiology   3. Anemia of chronic kidney disease:   - EPO with HD treatment  4. Secondary Hyperparathyroidism:  Home regimen seems to be calcium acetate 1 with each meal and sevelamer 1 with each meal.    LOS: 8 [name] 3/24/202010:13 AM

## 2018-08-18 NOTE — Discharge Summary (Signed)
[facility] NAME: [name]    MR#:  [id number]  DATE OF BIRTH:  07-06-1955  DATE OF ADMISSION:  08/10/2018 ADMITTING PHYSICIAN: Arta Silence, MD  DATE OF DISCHARGE: 08/18/2018  PRIMARY CARE PHYSICIAN: Donnie Coffin, MD   ADMISSION DIAGNOSIS:  Pulmonary edema cardiac cause (HCC) [I50.1] Acute GI bleeding [K92.2] NSTEMI (non-ST elevated myocardial infarction) ([place]) [I21.4] End-stage renal disease on hemodialysis ([place]) [N18.6, Z99.2] Acute respiratory failure with hypoxia (HCC) [J96.01] Symptomatic anemia [D64.9]  DISCHARGE DIAGNOSIS:  Active Problems:   Non-STEMI (non-ST elevated myocardial infarction) ([place])   Acute GI bleeding   SECONDARY DIAGNOSIS:   Past Medical History:  Diagnosis Date  . 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 Amputee, below knee, left ([place])   . Anemia   . Asthma   . Cardiomyopathy ([place])   . CHF (congestive heart failure) ([place])   . Chronic systolic heart failure ([place])   . Complication of anesthesia    hypotension  . COPD (chronic obstructive pulmonary disease) ([place])   . Coronary artery disease   . Dialysis patient ([place])    Mon, Wed, Fri  . End stage renal disease ([place])   . GERD (gastroesophageal reflux disease)   . Headache   . History of kidney stones   . History of pulmonary embolism   . HLD (hyperlipidemia)   . HTN (hypertension)   . Hyperparathyroidism   . Myocardial infarction ([place])   . Peripheral vascular disease ([place])   . Shortness of breath dyspnea   . Sleep apnea    NO C-PAP, Patient stated in process of  "getting one"   . Tobacco dependence      ADMITTING HISTORY  HISTORY OF PRESENT ILLNESS:  [name]  is a 63 y.o. male with a known history of multiple medical problems that will be mentioned below including end-stage renal disease on hemodialysis on Friday, CHF, anemia, COPD, coronary artery disease, who presented to the emergency room with acute  onset of midsternal midsternal chest pain, moderate in intensity and described as ripping pain with associated nausea and diaphoresis without vomiting, radiating to his left arm.  He has been having dyspnea and wheezing without cough or hemoptysis.  He denied any abdominal pain or melena or bright red bleeding per rectum.  No bilious vomitus or hematemesis.  He has not missed hemodialysis.  No recent travels or surgeries or sick exposure.  No exposure to large crowds.  He was hypotensive upon arrival of EMS with a blood pressure of 70/50.  It has come up when he came to the ER.  Upon presentation to the emergency room, blood pressure was 114/84 with otherwise normal vital signs.  EKG showed normal sinus rhythm with a rate of 88 with left bundle branch block.  Labs were remarkable for a troponin I of 1.89.  Troponin I was 0.22 on 08/04/2018.  The patient's hemoglobin and hematocrit tonight were 7.6 and 24.2.  Platelets were 209 and WBC 8.9.  INR was 1.3 with a PT of 15.9 PTT of 36.  Portable chest x-ray revealed Cardiomegaly with bilateral effusions diffuse bilateral airspace disease likely edema.  The patient was given IV heparin bolus and drip, 1 mg of IV Dilaudid and 4 mg IV morphine sulfate as well as started on IV nitroglycerin drip for persistent pain.  When his stool Hemoccult came back positive I recommended a bolus of 80 mg IV Protonix followed by IV  Protonix drip that was given.  A cardiology consultation over the phone was obtained by Dr. Nehemiah Massed as well as a GI consultation by Dr. Marius Ditch.  The patient will be admitted to a stepdown unit for further evaluation and management.   HOSPITAL COURSE:   1. None-ST elevation MI. on IV heparin per Dr. Alveria Apley recommendation while transfusing the patient 2 units of packed red blood cells for his anemia. PRN nitro. Eliquis held 2D echocardiogram with ejection fraction 20 to 25% On betablocker and statin. Significant blockages in LAD and left  main coronary and occluded proximal right coronary and proximal left circumflex.  Nuclear stress test was done after this and showed significant decline in ejection fraction up to 16% and high risk study. Dr. Clayborn Bigness has advised medical management. ON and off chest pain. Cardiac rehab after cleared by cardiology as OP. Stopped heparin drip  Seen by Dr. Nehemiah Massed on day of discharge.  Clear for discharge.  2. GI bleeding with acute blood loss anemia. Heme positive stool.  2 units packed RBC transfused and now hemoglobin stable. GI has signed off as hemoglobin stable and patient continues to have angina. Patient follow-up. Monitor hemoglobin Follow-up as outpatient in 1 to 2 weeks.  Started Protonix twice daily.  3. End-stage renal disease on hemodialysis likely with associated fluid overload could be related to acute on top of chronic systolic CHF. Nephrology followed in the hospital.  Continue hemodialysis as per outpatient schedule Cont HD per nephro .  4. COPD.  Spiriva and combivent  5. Dyslipidemia. Lipitor dose  increased .  6. Hypertension.  Coreg resumed  *History of DVT/PE  Patient has been on Eliquis for provoked DVTs since 2018.  This has been stopped due to GI bleed.  No acute issues.  As per his cardiologist Dr. Nehemiah Massed patient is stable to be discharged back to assisted living facility.  Cardiac rehabilitation has been deferred till he follows with his cardiologist due to his angina.  CONSULTS OBTAINED:    GI Cardiology Pulmonary/intensivist Physical therapy Pharmacy Nephrology DRUG ALLERGIES:   Allergies  Allergen Reactions  . Dust Mite Extract Other (See Comments)    Reaction: unknown    DISCHARGE MEDICATIONS:   Allergies as of 08/18/2018      Reactions   Dust Mite Extract Other (See Comments)   Reaction: unknown      Medication List    STOP taking these medications   apixaban 5 MG Tabs tablet Commonly known as:  Eliquis      TAKE these medications   acetaminophen 500 MG tablet Commonly known as:  TYLENOL Take 500 mg by mouth every 6 (six) hours as needed for mild pain.   albuterol 108 (90 Base) MCG/ACT inhaler Commonly known as:  PROVENTIL HFA;VENTOLIN HFA Inhale 2 puffs into the lungs every 6 (six) hours as needed for wheezing or shortness of breath.   albuterol (2.5 MG/3ML) 0.083% nebulizer solution Commonly known as:  PROVENTIL Take 3 mLs (2.5 mg total) by nebulization every 6 (six) hours as needed for wheezing or shortness of breath.   aspirin EC 325 MG tablet Take 325 mg by mouth daily.   atorvastatin 10 MG tablet Commonly known as:  LIPITOR Take 10 mg by mouth at bedtime.   budesonide-formoterol 160-4.5 MCG/ACT inhaler Commonly known as:  SYMBICORT Inhale 2 puffs into the lungs 2 (two) times daily.   calcium acetate 667 MG capsule Commonly known as:  PHOSLO Take 667 mg by mouth daily.   carvedilol 6.25  MG tablet Commonly known as:  COREG Take 1 tablet (6.25 mg total) by mouth 2 (two) times daily.   cetirizine 10 MG tablet Commonly known as:  ZYRTEC Take 10 mg by mouth daily.   Combivent Respimat 20-100 MCG/ACT Aers respimat Generic drug:  Ipratropium-Albuterol Inhale 1 puff into the lungs every 6 (six) hours as needed for wheezing or shortness of breath.   docusate sodium 100 MG capsule Commonly known as:  COLACE Take 100 mg by mouth daily as needed for mild constipation or moderate constipation.   erythromycin ophthalmic ointment Place 1 application into the right eye at bedtime.   fluticasone 50 MCG/ACT nasal spray Commonly known as:  FLONASE Place 1 spray into both nostrils daily.   hydrocortisone 2.5 % cream Apply 1 application topically 2 (two) times daily as needed (itching).   isosorbide mononitrate 30 MG 24 hr tablet Commonly known as:  IMDUR Take 30 mg by mouth daily.   midodrine 10 MG tablet Commonly known as:  PROAMATINE Take 10 mg by mouth every 8 (eight)  hours.   nitroGLYCERIN 0.4 MG SL tablet Commonly known as:  NITROSTAT Place 0.4 mg under the tongue every 5 (five) minutes as needed for chest pain.   pantoprazole 40 MG tablet Commonly known as:  PROTONIX Take 1 tablet (40 mg total) by mouth daily. What changed:    medication strength  how much to take   polyethylene glycol packet Commonly known as:  MIRALAX / GLYCOLAX Take 17 g by mouth daily as needed for mild constipation.   pregabalin 75 MG capsule Commonly known as:  LYRICA Take 1 capsule (75 mg total) by mouth daily.   Robafen 100 MG/5ML syrup Generic drug:  guaifenesin Take 200 mg by mouth 3 (three) times daily as needed for cough.   sevelamer carbonate 800 MG tablet Commonly known as:  RENVELA Take 800 mg by mouth 3 (three) times daily with meals.   THERA-M PO Take 1 tablet by mouth 2 (two) times daily.   tiotropium 18 MCG inhalation capsule Commonly known as:  SPIRIVA Place 18 mcg into inhaler and inhale daily.   traMADol 50 MG tablet Commonly known as:  ULTRAM Take 75 mg by mouth every 8 (eight) hours as needed (pain).       Today   VITAL SIGNS:  Blood pressure 106/77, pulse 85, temperature 99 F (37.2 C), temperature source Oral, resp. rate 18, height 5\' 8"  (1.727 m), weight 93.8 kg, SpO2 100 %.  I/O:    Intake/Output Summary (Last 24 hours) at 08/18/2018 1109 Last data filed at 08/18/2018 1008 Gross per 24 hour  Intake 480 ml  Output 1000 ml  Net -520 ml    PHYSICAL EXAMINATION:  Physical Exam  GENERAL:  63 y.o.-year-old patient lying in the bed with no acute distress.  LUNGS: Normal breath sounds bilaterally, no wheezing, rales,rhonchi or crepitation. No use of accessory muscles of respiration.  CARDIOVASCULAR: S1, S2 normal. No murmurs, rubs, or gallops.  ABDOMEN: Soft, non-tender, non-distended. Bowel sounds present. No organomegaly or mass.  NEUROLOGIC: Moves all 4 extremities. PSYCHIATRIC: The patient is alert and oriented x 3.   SKIN: No obvious rash, lesion, or ulcer.   DATA REVIEW:   CBC Recent Labs  Lab 08/18/18 0334  WBC 9.0  HGB 9.1*  HCT 28.6*  PLT 222    Chemistries  Recent Labs  Lab 08/16/18 0413  NA 138  K 3.8  CL 101  CO2 26  GLUCOSE 122*  BUN  50*  CREATININE 9.31*  CALCIUM 9.3  MG 2.4    Cardiac Enzymes Recent Labs  Lab 08/15/18 0954  TROPONINI 4.21*    Microbiology Results  Results for orders placed or performed during the hospital encounter of 08/10/18  MRSA PCR Screening     Status: Abnormal   Collection Time: 08/10/18  6:54 AM  Result Value Ref Range Status   MRSA by PCR POSITIVE (A) NEGATIVE Final    Comment:        The GeneXpert MRSA Assay (FDA approved for NASAL specimens only), is one component of a comprehensive MRSA colonization surveillance program. It is not intended to diagnose MRSA infection nor to guide or monitor treatment for MRSA infections. RESULT CALLED TO, READ BACK BY AND VERIFIED WITH:  [facility] [place] AT 4315 08/10/18 KM/SDR Performed at [facility], [address] [place], [place] [address]     RADIOLOGY:  No results found.  Follow up with PCP in 1 week.  Management plans discussed with the patient, family and they are in agreement.  CODE STATUS:     Code Status Orders  (From admission, onward)         Start     Ordered   08/10/18 0528  Full code  Continuous     08/10/18 0546        Code Status History    Date Active Date Inactive Code Status Order ID Comments User Context   04/22/2018 1535 04/24/2018 2107 Full Code [id number]  Gorden Harms, MD Inpatient   12/21/2017 1105 12/23/2017 1713 Full Code [id number]  Henreitta Leber, MD Inpatient   09/18/2017 1335 09/19/2017 2041 Full Code [id number]  Bettey Costa, MD Inpatient   07/21/2017 1610 07/26/2017 0039 Full Code [id number]  Bettey Costa, MD Inpatient   06/22/2017 2145 06/23/2017 1805 Full Code [id number]  Henreitta Leber, MD Inpatient   04/07/2017 0937 04/10/2017  1828 Full Code [id number]  Eloise Levels, MD ED   01/19/2017 0145 01/21/2017 1941 Full Code [id number]  Lance Coon, MD Inpatient   11/11/2016 0421 11/14/2016 1908 Full Code [id number]  Harrie Foreman, MD Inpatient   10/28/2016 0756 10/30/2016 1930 Full Code [id number]  Demetrios Loll, MD ED   08/21/2016 0408 08/22/2016 1826 DNR [id number]  Harrie Foreman, MD Inpatient   08/03/2016 2319 08/07/2016 1928 DNR [id number]  Lance Coon, MD Inpatient   07/26/2016 1707 07/31/2016 2158 DNR [id number]  Vaughan Basta, MD Inpatient   05/22/2016 0034 05/24/2016 1911 Full Code [id number]  Hugelmeyer, Ubaldo Glassing, DO Inpatient   01/13/2015 0144 01/14/2015 1446 Full Code [id number]  Juluis Mire, MD ED   12/15/2014 0932 12/15/2014 1431 Full Code [id number]  Algernon Huxley, MD Inpatient   05/06/2014 2029 05/10/2014 1723 Full Code [id number]  Elam Dutch, MD Inpatient   05/04/2014 2224 05/06/2014 2029 Full Code [id number]  Rise Patience, MD Inpatient    Advance Directive Documentation     Most Recent Value  Type of Advance Directive  Healthcare Power of [place], Out of facility DNR (pink MOST or yellow form)  Pre-existing out of facility DNR order (yellow form or pink MOST form)  Yellow form placed in chart (order not valid for inpatient use)  "MOST" Form in Place?  -      TOTAL TIME TAKING CARE OF THIS PATIENT ON DAY OF DISCHARGE: more than 30 minutes.   Neita Carp M.D on 08/18/2018 at 11:09 AM  Between 7am to 6pm - Pager - [phone]  After  6pm go to [email or web address] - password [email or web address] [facility]  Office  [phone]  CC: Primary care physician; Donnie Coffin, MD  Note: This dictation was prepared with Dragon dictation along with smaller phrase technology. Any transcriptional errors that result from this process are unintentional.

## 2018-08-18 NOTE — TOC Initial Note (Signed)
Transition of Care [facility]) - Initial/Assessment Note    Patient Details  Name: [name] MRN: [id number] Date of Birth: 1956/04/27  [facility]) CM/SW Contact:    Ross Ludwig, LCSW Phone Number: 08/18/2018, 3:10 PM  Clinical Narrative:  Patient is a 63 year old male who is alert and oriented x4.  Patient is from [facility], patient plans to return to ALF.  Patient states he has been there for about two years, and overall it is okay.  Patient states he does have issues sometimes, but overall he is pleased with the care.  Patient is looking forward to returning to ALF, patient stated he is able to get to his appointments by [facility], and his daughter and son visit him regularly.  Patient was explained role of CSW and process for returning back to ALF.  Patient did not express any other questions or concerns.                    Expected Discharge Plan: Assisted Living Barriers to Discharge: Barriers Resolved   Patient Goals and CMS Choice Patient states their goals for this hospitalization and ongoing recovery are:: Patient wants to return to ALF to continue with home health. CMS Medicare.gov Compare Post Acute Care list provided to:: Patient Choice offered to / list presented to : Patient  Expected Discharge Plan and Services Expected Discharge Plan: Assisted Living In-house Referral: Clinical Social Work   Post Acute Care Choice: Home Health Living arrangements for the past 2 months: [place] Expected Discharge Date: 08/18/18                   HH Arranged: PT, OT, RN    Prior Living Arrangements/Services Living arrangements for the past 2 months: [address] Lives with:: Facility Resident([facility]) Patient language and need for interpreter reviewed:: No Do you feel safe going back to the place where you live?: Yes      Need for Family Participation in Patient Care: No (Comment) Care giver support system in place?: Yes  (comment) Current home services: Home PT, Home RN, Home OT Criminal Activity/Legal Involvement Pertinent to Current Situation/Hospitalization: No - Comment as needed  Activities of Daily Living      Permission Sought/Granted Permission sought to share information with : Family Supports, Customer service manager Permission granted to share information with : Yes, Verbal Permission Granted  Share Information with NAME: Estrella Deeds Daughter   [phone]   Permission granted to share info w AGENCY: [facility]        Emotional Assessment Appearance:: Appears stated age Attitude/Demeanor/Rapport: Engaged Affect (typically observed): Accepting, Appropriate, Calm, Pleasant, Stable Orientation: : Oriented to Self, Oriented to Place, Oriented to  Time, Oriented to Situation Alcohol / Substance Use: Not Applicable Psych Involvement: No (comment)  Admission diagnosis:  Pulmonary edema cardiac cause (HCC) [I50.1] Acute GI bleeding [K92.2] NSTEMI (non-ST elevated myocardial infarction) (HCC) [I21.4] End-stage renal disease on hemodialysis ([place]) [N18.6, Z99.2] Acute respiratory failure with hypoxia (HCC) [J96.01] Symptomatic anemia [D64.9] Patient Active Problem List   Diagnosis Date Noted  . Non-STEMI (non-ST elevated myocardial infarction) ([place]) 08/10/2018  . Acute GI bleeding   . HCAP (healthcare-associated pneumonia) 04/22/2018  . Acute on chronic respiratory failure with hypoxia ([place]) 12/21/2017  . Tobacco use 10/07/2017  . Apnea   . End-stage renal disease on hemodialysis ([place])   . Hypervolemia   . Altered mental status   .  Acute on chronic systolic CHF (congestive heart failure) ([place]) 01/18/2017  . Pulmonary embolism ([place]) 08/03/2016  . NSTEMI (non-ST elevated myocardial infarction) ([place]) 07/26/2016  . PVD (peripheral vascular disease) ([place]) 03/05/2016  . Preop examination 05/02/2015  . Chronic systolic heart failure ([place])   . Hyperkalemia  01/13/2015  . Normocytic anemia 05/04/2014  . Hyperlipidemia 04/26/2010  . HYPERTENSION, BENIGN 04/26/2010  . CAD, NATIVE VESSEL 04/26/2010   PCP:  Donnie Coffin, MD Pharmacy:   [place], [place] [address] Phone: [phone] Fax: [phone]     Social Determinants of Health (SDOH) Interventions    Readmission Risk Interventions Readmission Risk Prevention Plan 08/18/2018 04/24/2018  Transportation Screening Complete Complete  Medication Review Press photographer) Complete Complete  PCP or Specialist appointment within 3-5 days of discharge Complete Complete  HRI or Home Care Consult Complete Complete  SW Recovery Care/Counseling Consult Complete Complete  Palliative Care Screening Not Complete Not Complete  Comments Was not ordered -  Medication Reconcilation (Pharmacy) - Complete  Skilled Nursing Facility Not Applicable Not Complete  Some recent data might be hidden

## 2018-08-18 NOTE — Progress Notes (Signed)
[name] to be D/C'd to the [facility]F per MD order. Report given to [employer] at the [facility].  Allergies as of 08/18/2018      Reactions   Dust Mite Extract Other (See Comments)   Reaction: unknown      Medication List    STOP taking these medications   apixaban 5 MG Tabs tablet Commonly known as:  Eliquis     TAKE these medications   acetaminophen 500 MG tablet Commonly known as:  TYLENOL Take 500 mg by mouth every 6 (six) hours as needed for mild pain.   albuterol 108 (90 Base) MCG/ACT inhaler Commonly known as:  PROVENTIL HFA;VENTOLIN HFA Inhale 2 puffs into the lungs every 6 (six) hours as needed for wheezing or shortness of breath.   albuterol (2.5 MG/3ML) 0.083% nebulizer solution Commonly known as:  PROVENTIL Take 3 mLs (2.5 mg total) by nebulization every 6 (six) hours as needed for wheezing or shortness of breath.   aspirin EC 325 MG tablet Take 325 mg by mouth daily.   atorvastatin 10 MG tablet Commonly known as:  LIPITOR Take 10 mg by mouth at bedtime.   budesonide-formoterol 160-4.5 MCG/ACT inhaler Commonly known as:  SYMBICORT Inhale 2 puffs into the lungs 2 (two) times daily.   calcium acetate 667 MG capsule Commonly known as:  PHOSLO Take 667 mg by mouth daily.   carvedilol 6.25 MG tablet Commonly known as:  COREG Take 1 tablet (6.25 mg total) by mouth 2 (two) times daily.   cetirizine 10 MG tablet Commonly known as:  ZYRTEC Take 10 mg by mouth daily.   Combivent Respimat 20-100 MCG/ACT Aers respimat Generic drug:  Ipratropium-Albuterol Inhale 1 puff into the lungs every 6 (six) hours as needed for wheezing or shortness of breath.   docusate sodium 100 MG capsule Commonly known as:  COLACE Take 100 mg by mouth daily as needed for mild constipation or moderate constipation.   erythromycin ophthalmic ointment Place 1 application into the right eye at bedtime.   fluticasone 50 MCG/ACT nasal spray Commonly known as:  FLONASE Place 1 spray into  both nostrils daily.   hydrocortisone 2.5 % cream Apply 1 application topically 2 (two) times daily as needed (itching).   isosorbide mononitrate 30 MG 24 hr tablet Commonly known as:  IMDUR Take 30 mg by mouth daily.   midodrine 10 MG tablet Commonly known as:  PROAMATINE Take 10 mg by mouth every 8 (eight) hours.   nitroGLYCERIN 0.4 MG SL tablet Commonly known as:  NITROSTAT Place 0.4 mg under the tongue every 5 (five) minutes as needed for chest pain.   pantoprazole 40 MG tablet Commonly known as:  PROTONIX Take 1 tablet (40 mg total) by mouth daily. What changed:    medication strength  how much to take   polyethylene glycol packet Commonly known as:  MIRALAX / GLYCOLAX Take 17 g by mouth daily as needed for mild constipation.   pregabalin 75 MG capsule Commonly known as:  LYRICA Take 1 capsule (75 mg total) by mouth daily.   Robafen 100 MG/5ML syrup Generic drug:  guaifenesin Take 200 mg by mouth 3 (three) times daily as needed for cough.   sevelamer carbonate 800 MG tablet Commonly known as:  RENVELA Take 800 mg by mouth 3 (three) times daily with meals.   THERA-M PO Take 1 tablet by mouth 2 (two) times daily.   tiotropium 18 MCG inhalation capsule Commonly known as:  SPIRIVA Place 18 mcg  into inhaler and inhale daily.   traMADol 50 MG tablet Commonly known as:  ULTRAM Take 75 mg by mouth every 8 (eight) hours as needed (pain).       Vitals:   08/18/18 0744 08/18/18 1401  BP: 106/77 98/64  Pulse: 85 85  Resp:  18  Temp: 99 F (37.2 C) 98.8 F (37.1 C)  SpO2: 100% 100%    Tele box removed and returned. Skin clean, dry and intact without evidence of skin break down, no evidence of skin tears noted. IV catheter discontinued intact. Site without signs and symptoms of complications. Dressing and pressure applied. Pt denies pain at this time. No complaints noted.  An After Visit Summary was printed and given to the patient. Patient escorted via  stretcher, and D/C to the [facility] via EMS.  Rolley Sims

## 2018-08-18 NOTE — TOC Transition Note (Signed)
Transition of Care [facility]) - CM/SW Discharge Note   Patient Details  Name: [name] MRN: [id number] Date of Birth: Mar 13, 1956  [facility] CM/SW Contact:  Ross Ludwig, LCSW Phone Number: 08/18/2018, 3:56 PM   Clinical Narrative:     CSW was informed that patient will be ready for discharge today.  CSW contacted [facility], and they are agreeable to having patient return back to ALF.  Patient to be d/c'ed today to [facility].  Patient and family agreeable to plans will transport via ems RN to call report to [phone].  Patient's daughter Adonis Brook [phone] was updated that patient will be discharging today.    Final next level of care: Assisted Living([facility]) Barriers to Discharge: Barriers Resolved   Patient Goals and CMS Choice Patient states their goals for this hospitalization and ongoing recovery are:: Patient wants to return to ALF to continue with home health. CMS Medicare.gov Compare Post Acute Care list provided to:: Patient Choice offered to / list presented to : Patient  Discharge Placement  [facility]             Discharge Plan and Services In-house Referral: Clinical Social Work   Post Acute Care Choice: Home Health              HH Arranged: PT, OT, RN     Social Determinants of Health (SDOH) Interventions     Readmission Risk Interventions Readmission Risk Prevention Plan 08/18/2018 04/24/2018  Transportation Screening Complete Complete  Medication Review Press photographer) Complete Complete  PCP or Specialist appointment within 3-5 days of discharge Complete Complete  HRI or Home Care Consult Complete Complete  SW Recovery Care/Counseling Consult Complete Complete  Palliative Care Screening Not Complete Not Complete  Comments Was not ordered -  Medication Reconcilation (Pharmacy) - Complete  [place] Not Applicable Not Complete  Some recent data might be hidden

## 2018-08-18 NOTE — Progress Notes (Signed)
Report called to [employer] (Resident case Freight forwarder) at [facility], [facility] called for transportation.

## 2018-08-18 NOTE — Progress Notes (Signed)
[facility] Encounter Note  Patient: [name] / Admit Date: 08/10/2018 / Date of Encounter: 08/18/2018, 8:57 AM   Subjective: Patient had chest pain last night with apparent ST depression by telemetry suggesting continued evidence of ischemia.  Patient has had no further chest pain this morning with appropriate medication management listed below.  Elevated troponin is 7.3 consistent with non-ST elevation myocardial infarction.  Echocardiogram showing severe LV systolic dysfunction with possible mitral valve chordal abnormality with mitral insufficiency and ejection fraction of 20% Cardiac catheterization showing significantly elevated end-diastolic pressure Occluded proximal right coronary artery and occluded proximal left circumflex artery with significant 85% stenosis of left anterior descending artery and left main coronary artery  There are concerns of intervention on significant left main and left anterior descending artery stenosis with a high risk procedure without knowing significance of ischemia of severe anemia now corrected  Multiple rounds of dialysis as well as ambulation without evidence of significant chest discomfort requiring further intervention at this time.  Appropriate medication management for ischemia and angina has improved patient's overall status.  Review of Systems: Positive for: Shortness of breath   Negative for: Vision change, hearing change, syncope, dizziness, nausea, vomiting,diarrhea, bloody stool, stomach pain, cough, congestion, diaphoresis, urinary frequency, urinary pain,skin lesions, skin rashes Others previously listed  Objective: Telemetry: Normal sinus rhythm with left bundle branch block Physical Exam: Blood pressure 106/77, pulse 85, temperature 99 F (37.2 C), temperature source Oral, resp. rate 18, height 5\' 8"  (1.727 m), weight 93.8 kg, SpO2 100 %. Body mass index is 31.44 kg/m. General: Well developed, well  nourished, in no acute distress. Head: Normocephalic, atraumatic, sclera non-icteric, no xanthomas, nares are without discharge. Neck: No apparent masses Lungs: Normal respirations with no wheezes, no rhonchi, few rales , basilar crackles   Heart: Regular rate and rhythm, normal S1 S2, 2+ apical murmur, no rub, no gallop, PMI is normal size and placement, carotid upstroke normal without bruit, jugular venous pressure normal Abdomen: Soft, non-tender, non-distended with normoactive bowel sounds. No hepatosplenomegaly. Abdominal aorta is normal size without bruit Extremities: 1+ edema, no clubbing, no cyanosis, no ulcers,  Peripheral: 2+ radial, 2+ femoral, 2+ dorsal pedal pulses Neuro: Alert and oriented. Moves all extremities spontaneously. Psych:  Responds to questions appropriately with a normal affect.   Intake/Output Summary (Last 24 hours) at 08/18/2018 0857 Last data filed at 08/17/2018 1800 Gross per 24 hour  Intake 480 ml  Output 1000 ml  Net -520 ml    Inpatient Medications:  . aspirin EC  81 mg Oral Daily  . atorvastatin  80 mg Oral q1800  . bisacodyl  10 mg Rectal Once  . calcium acetate  667 mg Oral TID WC  . carvedilol  3.125 mg Oral BID  . Chlorhexidine Gluconate Cloth  6 each Topical Q0600  . epoetin (EPOGEN/PROCRIT) injection  4,000 Units Intravenous Q M,W,F-HD  . erythromycin  1 application Right Eye QHS  . feeding supplement (NEPRO CARB STEADY)  237 mL Oral BID BM  . fluticasone  1 spray Each Nare Daily  . heparin injection (subcutaneous)  5,000 Units Subcutaneous Q8H  . isosorbide mononitrate  60 mg Oral Daily  . loratadine  10 mg Oral Daily  . mouth rinse  15 mL Mouth Rinse BID  . midodrine  10 mg Oral Q8H  . multivitamin  1 tablet Oral QHS  . pantoprazole (PROTONIX) IV  40 mg Intravenous Q12H  . pregabalin  75 mg Oral Daily  .  ranolazine  500 mg Oral BID  . senna-docusate  2 tablet Oral BID  . sevelamer carbonate  800 mg Oral TID WC  . tiotropium  18 mcg  Inhalation Daily  . vitamin C  500 mg Oral BID   Infusions:    Labs: Recent Labs    08/16/18 0413  NA 138  K 3.8  CL 101  CO2 26  GLUCOSE 122*  BUN 50*  CREATININE 9.31*  CALCIUM 9.3  MG 2.4  PHOS 5.1*   No results for input(s): AST, ALT, ALKPHOS, BILITOT, PROT, ALBUMIN in the last 72 hours. Recent Labs    08/17/18 0433 08/18/18 0334  WBC 9.0 9.0  HGB 8.6* 9.1*  HCT 27.3* 28.6*  MCV 96.5 96.3  PLT 203 222   Recent Labs    08/15/18 0954  TROPONINI 4.21*   Invalid input(s): POCBNP No results for input(s): HGBA1C in the last 72 hours.   Weights: Filed Weights   08/17/18 0945 08/17/18 1354 08/18/18 0640  Weight: 94.8 kg 93.8 kg 93.8 kg     Radiology/Studies:  Nm Myocar Multi W/spect W/wall Motion / Ef  Result Date: 08/13/2018  Blood pressure demonstrated a blunted response to exercise.  Baseline left bundle branch block uninterpretable EKG  This is a high risk study.  The left ventricular ejection fraction is severely decreased (<30%).  Nuclear stress EF: 16%.  Conclusion Adequate chemical stress with baseline abnormal EKG anginal symptoms during infusion Relieved with nitroglycerin   Dg Chest Portable 1 View  Result Date: 08/10/2018 CLINICAL DATA:  Chest pain EXAM: PORTABLE CHEST 1 VIEW COMPARISON:  04/24/2018 FINDINGS: Cardiomegaly. Bilateral airspace disease with small bilateral effusions. Findings likely reflect edema/CHF. No acute bony abnormality. IMPRESSION: Cardiomegaly with bilateral effusions and diffuse bilateral airspace disease, likely edema/CHF. Electronically Signed   By: Rolm Baptise M.D.   On: 08/10/2018 03:09     Assessment and Recommendation  63 y.o. male with known chronic kidney disease stage V hypertension hyperlipidemia coronary artery disease now with acute non-ST elevation myocardial infarction and acute on chronic systolic dysfunction heart failure slightly improved status post GI bleed of unknown origin suspecting upper gastric due  to melanic stool now with more stable hemoglobin at 9.3 needing further treatment options Cardiac catheterization with occluded proximal right coronary artery left circumflex artery and significant stenosis of distal left main and LAD at 85% After discussion with interventional cardiology there is concerns that the patient has very high risk features including severe LV dysfunction and severe coronary artery disease.   Patient with now appropriate medication management and more stable not requiring further intervention at this time 1.  Continue medication management for anginal symptoms including nitrate and beta-blocker as before 2.  No further intervention of significant coronary artery disease due to very high risk procedure and current condition does not warrant need for intervention today 3.  Continuation of beta-blocker for cardiomyopathy and acute on chronic systolic dysfunction heart failure  5.  High intensity cholesterol therapy  6.  Okay for ambulation and continuation of unrestricted dialysis at this time.  If continuing to perform well at this time okay for discharged home from cardiac standpoint with follow-up in 1 week for further adjustments of medication management   signed, Serafina Royals M.D. FACC

## 2018-08-18 NOTE — Plan of Care (Signed)
  Problem: Pain Managment: Goal: General experience of comfort will improve Outcome: Progressing   

## 2018-08-18 NOTE — NC FL2 (Signed)
[place] LEVEL OF CARE SCREENING TOOL     IDENTIFICATION  Patient Name: [name] Birthdate: November 23, 1955 Sex: male Admission Date (Current Location): 08/10/2018  [place] and [place] Number:  [name] [id number] [place] and Address:  [facility], [address], [place], [place] [address]      Provider Number: [id number]  Attending Physician Name and Address:  Hillary Bow, MD  Relative Name and Phone Number:  Estrella Deeds Daughter   [phone]     Current Level of Care: Hospital Recommended Level of Care: Assisted Living Facility([facility]) Prior Approval Number:    Date Approved/Denied:   PASRR Number:    Discharge Plan: Domiciliary (Rest home)([facility])    Current Diagnoses: Patient Active Problem List   Diagnosis Date Noted  . Non-STEMI (non-ST elevated myocardial infarction) ([place]) 08/10/2018  . Acute GI bleeding   . HCAP (healthcare-associated pneumonia) 04/22/2018  . Acute on chronic respiratory failure with hypoxia ([place]) 12/21/2017  . Tobacco use 10/07/2017  . Apnea   . End-stage renal disease on hemodialysis ([place])   . Hypervolemia   . Altered mental status   . Acute on chronic systolic CHF (congestive heart failure) ([place]) 01/18/2017  . Pulmonary embolism ([place]) 08/03/2016  . NSTEMI (non-ST elevated myocardial infarction) ([place]) 07/26/2016  . PVD (peripheral vascular disease) ([place]) 03/05/2016  . Preop examination 05/02/2015  . Chronic systolic heart failure ([place])   . Hyperkalemia 01/13/2015  . Normocytic anemia 05/04/2014  . Hyperlipidemia 04/26/2010  . HYPERTENSION, BENIGN 04/26/2010  . CAD, NATIVE VESSEL 04/26/2010    Orientation RESPIRATION BLADDER Height & Weight     Self, Time, Situation, Place  O2(3L) Continent Weight: 206 lb 12.7 oz (93.8 kg) Height:  5\' 8"  (172.7 cm)  BEHAVIORAL SYMPTOMS/MOOD NEUROLOGICAL BOWEL NUTRITION STATUS      Continent Diet(Renal diet)  AMBULATORY STATUS  COMMUNICATION OF NEEDS Skin   Supervision Verbally Other (Comment)(Diabetic Ulcer)                       Personal Care Assistance Level of Assistance  Bathing, Feeding, Dressing Bathing Assistance: Limited assistance Feeding assistance: Independent Dressing Assistance: Limited assistance     Functional Limitations Info  Sight, Hearing, Speech Sight Info: Adequate Hearing Info: Adequate Speech Info: Adequate    SPECIAL CARE FACTORS FREQUENCY  PT (By licensed PT)     PT Frequency: Home Health, minimum 2x a week              Contractures Contractures Info: Not present    Additional Factors Info  Code Status, Allergies, Isolation Precautions Code Status Info: Full Code Allergies Info: DUST MITE EXTRACT      Isolation Precautions Info: History of MRSA contact precautions     Current Medications (08/18/2018):  This is the current hospital active medication list Current Facility-Administered Medications  Medication Dose Route Frequency Provider Last Rate Last Dose  . acetaminophen (TYLENOL) tablet 650 mg  650 mg Oral Q4H PRN Mansy, Jan A, MD      . albuterol (PROVENTIL) (2.5 MG/3ML) 0.083% nebulizer solution 2.5 mg  2.5 mg Inhalation Q6H PRN Mansy, Jan A, MD      . aspirin EC tablet 81 mg  81 mg Oral Daily Mansy, Jan A, MD   81 mg at 08/18/18 0859  . atorvastatin (LIPITOR) tablet 80 mg  80 mg Oral q1800 Mansy, Jan A, MD   80 mg at 08/17/18 1732  . bisacodyl (DULCOLAX) suppository 10 mg  10 mg Rectal Once Ottie Glazier, MD      . calcium acetate (PHOSLO) capsule 667 mg  667 mg Oral TID WC Kolluru, Sarath, MD   667 mg at 08/18/18 1139  . carvedilol (COREG) tablet 3.125 mg  3.125 mg Oral BID Callwood, Dwayne D, MD   3.125 mg at 08/18/18 0859  . Chlorhexidine Gluconate Cloth 2 % PADS 6 each  6 each Topical Q0600 Murlean Iba, MD   6 each at 08/18/18 0630  . epoetin alfa (EPOGEN,PROCRIT) injection 4,000 Units  4,000 Units Intravenous Q M,W,F-HD Holley Raring, Munsoor, MD    4,000 Units at 08/17/18 1254  . erythromycin ophthalmic ointment 1 application  1 application Right Eye QHS Mansy, Jan A, MD      . feeding supplement (NEPRO CARB STEADY) liquid 237 mL  237 mL Oral BID BM Lateef, Munsoor, MD   237 mL at 08/17/18 1501  . fluticasone (FLONASE) 50 MCG/ACT nasal spray 1 spray  1 spray Each Nare Daily Mansy, Jan A, MD   1 spray at 08/13/18 1050  . guaiFENesin (ROBITUSSIN) 100 MG/5ML solution 200 mg  200 mg Oral TID PRN Mansy, Jan A, MD      . heparin injection 5,000 Units  5,000 Units Subcutaneous Q8H Hillary Bow, MD   5,000 Units at 08/18/18 [phone]  . ipratropium-albuterol (DUONEB) 0.5-2.5 (3) MG/3ML nebulizer solution 3 mL  3 mL Inhalation Q6H PRN Mansy, Jan A, MD      . isosorbide mononitrate (IMDUR) 24 hr tablet 60 mg  60 mg Oral Daily Ottie Glazier, MD   60 mg at 08/18/18 0858  . loratadine (CLARITIN) tablet 10 mg  10 mg Oral Daily Mansy, Jan A, MD   10 mg at 08/18/18 0859  . MEDLINE mouth rinse  15 mL Mouth Rinse BID Ottie Glazier, MD   15 mL at 08/18/18 0901  . midodrine (PROAMATINE) tablet 10 mg  10 mg Oral Q8H Mansy, Jan A, MD   10 mg at 08/18/18 6389  . morphine 2 MG/ML injection 2 mg  2 mg Intravenous Q4H PRN Darel Hong D, NP   2 mg at 08/15/18 1024  . multivitamin (RENA-VIT) tablet 1 tablet  1 tablet Oral QHS Charlett Nose, RPH   1 tablet at 08/17/18 1510  . nitroGLYCERIN (NITROSTAT) SL tablet 0.4 mg  0.4 mg Sublingual Q5 min PRN Darel Hong D, NP   0.4 mg at 08/15/18 3734  . ondansetron (ZOFRAN) injection 4 mg  4 mg Intravenous Q6H PRN Mansy, Jan A, MD      . pantoprazole (PROTONIX) injection 40 mg  40 mg Intravenous Q12H Ottie Glazier, MD   40 mg at 08/18/18 0859  . polyethylene glycol (MIRALAX / GLYCOLAX) packet 17 g  17 g Oral Daily PRN Mansy, Jan A, MD      . pregabalin (LYRICA) capsule 75 mg  75 mg Oral Daily Mansy, Jan A, MD   75 mg at 08/18/18 0859  . ranolazine (RANEXA) 12 hr tablet 500 mg  500 mg Oral BID Ottie Glazier, MD    500 mg at 08/18/18 1139  . senna-docusate (Senokot-S) tablet 2 tablet  2 tablet Oral BID Ottie Glazier, MD   2 tablet at 08/18/18 0858  . sevelamer carbonate (RENVELA) tablet 800 mg  800 mg Oral TID WC Kolluru, Sarath, MD   800 mg at 08/18/18 1139  . tiotropium (SPIRIVA) inhalation capsule ([facility] use ONLY) 18 mcg  18 mcg Inhalation Daily Mansy, Arvella Merles, MD  18 mcg at 08/16/18 1015  . traMADol (ULTRAM) tablet 75 mg  75 mg Oral Q8H PRN Sudini, Alveta Heimlich, MD      . vitamin C (ASCORBIC ACID) tablet 500 mg  500 mg Oral BID Charlett Nose, RPH   500 mg at 08/18/18 3790  . zolpidem (AMBIEN) tablet 5 mg  5 mg Oral QHS PRN,MR X 1 Mansy, Jan A, MD   5 mg at 08/11/18 2257     Discharge Medications: STOP taking these medications   apixaban 5 MG Tabs tablet Commonly known as:  Eliquis     TAKE these medications   acetaminophen 500 MG tablet Commonly known as:  TYLENOL Take 500 mg by mouth every 6 (six) hours as needed for mild pain.   albuterol 108 (90 Base) MCG/ACT inhaler Commonly known as:  PROVENTIL HFA;VENTOLIN HFA Inhale 2 puffs into the lungs every 6 (six) hours as needed for wheezing or shortness of breath.   albuterol (2.5 MG/3ML) 0.083% nebulizer solution Commonly known as:  PROVENTIL Take 3 mLs (2.5 mg total) by nebulization every 6 (six) hours as needed for wheezing or shortness of breath.   aspirin EC 325 MG tablet Take 325 mg by mouth daily.   atorvastatin 10 MG tablet Commonly known as:  LIPITOR Take 10 mg by mouth at bedtime.   budesonide-formoterol 160-4.5 MCG/ACT inhaler Commonly known as:  SYMBICORT Inhale 2 puffs into the lungs 2 (two) times daily.   calcium acetate 667 MG capsule Commonly known as:  PHOSLO Take 667 mg by mouth daily.   carvedilol 6.25 MG tablet Commonly known as:  COREG Take 1 tablet (6.25 mg total) by mouth 2 (two) times daily.   cetirizine 10 MG tablet Commonly known as:  ZYRTEC Take 10 mg by mouth daily.   Combivent Respimat  20-100 MCG/ACT Aers respimat Generic drug:  Ipratropium-Albuterol Inhale 1 puff into the lungs every 6 (six) hours as needed for wheezing or shortness of breath.   docusate sodium 100 MG capsule Commonly known as:  COLACE Take 100 mg by mouth daily as needed for mild constipation or moderate constipation.   erythromycin ophthalmic ointment Place 1 application into the right eye at bedtime.   fluticasone 50 MCG/ACT nasal spray Commonly known as:  FLONASE Place 1 spray into both nostrils daily.   hydrocortisone 2.5 % cream Apply 1 application topically 2 (two) times daily as needed (itching).   isosorbide mononitrate 30 MG 24 hr tablet Commonly known as:  IMDUR Take 30 mg by mouth daily.   midodrine 10 MG tablet Commonly known as:  PROAMATINE Take 10 mg by mouth every 8 (eight) hours.   nitroGLYCERIN 0.4 MG SL tablet Commonly known as:  NITROSTAT Place 0.4 mg under the tongue every 5 (five) minutes as needed for chest pain.   pantoprazole 40 MG tablet Commonly known as:  PROTONIX Take 1 tablet (40 mg total) by mouth daily. What changed:    medication strength  how much to take   polyethylene glycol packet Commonly known as:  MIRALAX / GLYCOLAX Take 17 g by mouth daily as needed for mild constipation.   pregabalin 75 MG capsule Commonly known as:  LYRICA Take 1 capsule (75 mg total) by mouth daily.   Robafen 100 MG/5ML syrup Generic drug:  guaifenesin Take 200 mg by mouth 3 (three) times daily as needed for cough.   sevelamer carbonate 800 MG tablet Commonly known as:  RENVELA Take 800 mg by mouth 3 (three) times daily with  meals.   THERA-M PO Take 1 tablet by mouth 2 (two) times daily.   tiotropium 18 MCG inhalation capsule Commonly known as:  SPIRIVA Place 18 mcg into inhaler and inhale daily.   traMADol 50 MG tablet Commonly known as:  ULTRAM Take 75 mg by mouth every 8 (eight) hours as needed (pain).     Relevant Imaging  Results:  Relevant Lab Results:   Additional Information SSN [id number]  Ross Ludwig, LCSW

## 2018-08-18 NOTE — Discharge Instructions (Signed)
Resume diet as before.  Continue hemodialysis.

## 2018-08-21 ENCOUNTER — Ambulatory Visit (INDEPENDENT_AMBULATORY_CARE_PROVIDER_SITE_OTHER): Payer: Medicare Other | Admitting: Gastroenterology

## 2018-08-21 ENCOUNTER — Other Ambulatory Visit: Payer: Self-pay

## 2018-08-21 DIAGNOSIS — K5909 Other constipation: Secondary | ICD-10-CM | POA: Diagnosis not present

## 2018-08-21 DIAGNOSIS — D126 Benign neoplasm of colon, unspecified: Secondary | ICD-10-CM | POA: Diagnosis not present

## 2018-08-21 DIAGNOSIS — K625 Hemorrhage of anus and rectum: Secondary | ICD-10-CM | POA: Diagnosis not present

## 2018-08-21 NOTE — Progress Notes (Signed)
Cephas Darby, MD [address]  [place], [place] [address]  Main: [phone]  Fax: [phone]    Gastroenterology Consultation Virtual/Tele Visit  Referring Provider:     Donnie Coffin, MD Primary Care Physician:  Donnie Coffin, MD Primary Gastroenterologist:  Dr. Sherri Sear Reason for Consultation:     Rectal bleeding, hospital follow up        HPI:   [name] is a 62 y.o. male referred by Dr. Donnie Coffin, MD  for consultation & management of  Rectal bleeding  Virtual Visit via Telephone Note  I connected with [name] on 08/21/18 at  9:05 AM EDT by telephone and verified that I am speaking with the correct person using two identifiers.   I discussed the limitations, risks, security and privacy concerns of performing an evaluation and management service by telephone and the availability of in person appointments. I also discussed with the patient that there may be a patient responsible charge related to this service. The patient expressed understanding and agreed to proceed.  Location of the Patient: outpatient dialysis center  Location of the provider: Home office   History of Present Illness: 63 y/o M with ESRD on HD, CAD, severe ICM, COPD, HTN, HLD, PAD s/p left BKA was admitted to [facility] on 08/10/18 with NSTEMI in setting of anemia, while on eliquis. Hb was 7.6 on admission dropped from baseline 12 in 03/2018. He underwent cardiac acth found to have significant 3 vessel CAD and severe ICMP EF 16%, Cardiology recommended medical management only.  GI was consulted at that time due to BRBPR. He reported rectal bleeding when he moves his bowels but he had no h/o profuse rectal bleeding, black stools or hematochezia and thus was felt to be high risk procedure in setting of NSTEMI. He was conservatively managed with 2 units PRBCs, IV protonix.Decision was made at that time to hold off on GI intervention as he did not show signs of active GI  bleed.  He was discharged on ASA 325mg  and protonix 40mg  daily, Eliquis was discontinued.  He had h/o Hep C which was treated per patient and with no h/o cirrhosis.   When I spoke to him today, he was at dialysis center undergoing dialysis. He reports severe constipation for which he is taking stool softner. He denied black stools, abdominal pain, n/v, SOB, CP  NSAIDs: ASA 325mg  daily for CAD  Antiplts/Anticoagulants/Anti thrombotics: ASA 325mg  daily for CAD Previously on eliquis until 07/2018 for provoked PE/DVT, stopped due to rectal bleeding  GI Procedures:  Colonoscopy 04/2013 by Dr Rayann Heman Diagnosis:  ASCENDING COLON POLYP AND TRANSVERSE COLON POLYPS X 3 COLD  BIOPSY:  - TUBULAR ADENOMAS, 2 FRAGMENTS.  - NONSPECIFIC CR[place] HYPERPLASIA WITHOUT DYSPLASIA, 1 FRAGMENT.  - UNREMARKABLE COLONIC MUCOSA WITHOUT DYSPLASIA, 1 FRAGMENT.     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[place])    Right Foot   Amputee, below knee, left (HCC)    Anemia    Asthma    Cardiomyopathy ([employer])    CHF (congestive heart failure) (HCC)    Chronic systolic heart failure (HCC)    Complication of anesthesia    hypotension   COPD (chronic obstructive pulmonary disease) (HCC)    Coronary artery disease    Dialysis patient ([place])    Mon, Wed, Fri   End stage renal disease ([place])    GERD (gastroesophageal reflux disease)  Headache    History of kidney stones    History of pulmonary embolism    HLD (hyperlipidemia)    HTN (hypertension)    Hyperparathyroidism    Myocardial infarction [facility])    Peripheral vascular disease (HCC)    Shortness of breath dyspnea    Sleep apnea    NO C-PAP, Patient stated in process of  "getting one"    Tobacco dependence     Past Surgical History:  Procedure Laterality Date   A/V FISTULAGRAM Left 08/14/2017   Procedure: A/V FISTULAGRAM;  Surgeon: Algernon Huxley, MD;  Location: [place] [facility];  Service: Cardiovascular;   Laterality: Left;   AMPUTATION Left 05/06/2014   Procedure: AMPUTATION BELOW KNEE;  Surgeon: Elam Dutch, MD;  Location: [place];  Service: Vascular;  Laterality: Left;   AMPUTATION Right 01/12/2015   Procedure: Foot transmetatarsal amputation;  Surgeon: Algernon Huxley, MD;  Location: [facility];  Service: Vascular;  Laterality: Right;   APPLICATION OF WOUND VAC Right 03/01/2015   Procedure: Application of Bio-connekt graft and wound vac application to right foot ;  Surgeon: Algernon Huxley, MD;  Location: [facility];  Service: Vascular;  Laterality: Right;   AV FISTULA PLACEMENT Left    AV FISTULA PLACEMENT Left 11/28/2016   Procedure: ARTERIOVENOUS (AV) FISTULA CREATION;  Surgeon: Algernon Huxley, MD;  Location: [facility];  Service: Vascular;  Laterality: Left;   CARDIAC CATHETERIZATION     stent placement    CORONARY ANGIOPLASTY     DIALYSIS/PERMA CATHETER INSERTION N/A 07/31/2016   Procedure: Dialysis/Perma Catheter Insertion;  Surgeon: Algernon Huxley, MD;  Location: [place] [facility];  Service: Cardiovascular;  Laterality: N/A;   DIALYSIS/PERMA CATHETER INSERTION N/A 07/25/2017   Procedure: DIALYSIS/PERMA CATHETER INSERTION and fistulagram;  Surgeon: Algernon Huxley, MD;  Location: [place] [facility];  Service: Cardiovascular;  Laterality: N/A;   DIALYSIS/PERMA CATHETER REMOVAL N/A 07/22/2017   Procedure: DIALYSIS/PERMA CATHETER REMOVAL;  Surgeon: Katha Cabal, MD;  Location: [place] [facility];  Service: Cardiovascular;  Laterality: N/A;   DIALYSIS/PERMA CATHETER REMOVAL N/A 02/05/2018   Procedure: DIALYSIS/PERMA CATHETER REMOVAL;  Surgeon: Algernon Huxley, MD;  Location: [place] [facility];  Service: Cardiovascular;  Laterality: N/A;   IR FLUORO GUIDE CV LINE LEFT  04/10/2017   LEFT HEART CATH AND CORONARY ANGIOGRAPHY N/A 08/11/2018   Procedure: LEFT HEART CATH AND CORONARY ANGIOGRAPHY;  Surgeon: Corey Skains, MD;  Location: [place] [facility];  Service: Cardiovascular;  Laterality:  N/A;   LIGATION OF ARTERIOVENOUS  FISTULA Right 01/31/2016   Procedure: LIGATION OF ARTERIOVENOUS  FISTULA;  Surgeon: Algernon Huxley, MD;  Location: [facility];  Service: Vascular;  Laterality: Right;   PERIPHERAL VASCULAR CATHETERIZATION Right 12/15/2014   Procedure: Lower Extremity Angiography;  Surgeon: Algernon Huxley, MD;  Location: [place] [facility];  Service: Cardiovascular;  Laterality: Right;   PERIPHERAL VASCULAR CATHETERIZATION  12/15/2014   Procedure: Lower Extremity Intervention;  Surgeon: Algernon Huxley, MD;  Location: [place] [facility];  Service: Cardiovascular;;   PERIPHERAL VASCULAR CATHETERIZATION Right 08/14/2015   Procedure: A/V Shuntogram/Fistulagram;  Surgeon: Algernon Huxley, MD;  Location: [place] [facility];  Service: Cardiovascular;  Laterality: Right;   PERIPHERAL VASCULAR CATHETERIZATION N/A 08/14/2015   Procedure: A/V Shunt Intervention;  Surgeon: Algernon Huxley, MD;  Location: [place] [facility];  Service: Cardiovascular;  Laterality: N/A;   PERIPHERAL VASCULAR CATHETERIZATION N/A 01/11/2016   Procedure: Dialysis/Perma Catheter Insertion;  Surgeon: Algernon Huxley, MD;  Location: [place] [facility];  Service: Cardiovascular;  Laterality: N/A;   REVISON OF ARTERIOVENOUS FISTULA Right 02/17/2016   Procedure: removal of AV fistula;  Surgeon: Serafina Mitchell, MD;  Location: [facility];  Service: Vascular;  Laterality: Right;   REVISON OF ARTERIOVENOUS FISTULA Right 01/31/2016   Procedure: REVISON OF ARTERIOVENOUS FISTULA ( BRACHIOCEPHALIC ) W/ ARTEGRAFT;  Surgeon: Algernon Huxley, MD;  Location: [facility];  Service: Vascular;  Laterality: Right;   TRANSMETATARSAL AMPUTATION Right 05/04/2015   Procedure: TRANSMETATARSAL AMPUTATION REVISION, great toe amputation;  Surgeon: Algernon Huxley, MD;  Location: [facility];  Service: Vascular;  Laterality: Right;    Current Outpatient Medications:    acetaminophen (TYLENOL) 500 MG tablet, Take 500 mg by mouth every 6 (six) hours as needed for mild pain.  , Disp: , Rfl:    albuterol (PROVENTIL HFA;VENTOLIN HFA) 108 (90 Base) MCG/ACT inhaler, Inhale 2 puffs into the lungs every 6 (six) hours as needed for wheezing or shortness of breath. , Disp: , Rfl:    albuterol (PROVENTIL) (2.5 MG/3ML) 0.083% nebulizer solution, Take 3 mLs (2.5 mg total) by nebulization every 6 (six) hours as needed for wheezing or shortness of breath., Disp: 300 mL, Rfl: 1   aspirin EC 325 MG tablet, Take 325 mg by mouth daily. , Disp: , Rfl:    atorvastatin (LIPITOR) 10 MG tablet, Take 10 mg by mouth at bedtime. , Disp: , Rfl:    budesonide-formoterol (SYMBICORT) 160-4.5 MCG/ACT inhaler, Inhale 2 puffs into the lungs 2 (two) times daily., Disp: , Rfl:    calcium acetate (PHOSLO) 667 MG capsule, Take 667 mg by mouth daily. , Disp: , Rfl:    carvedilol (COREG) 6.25 MG tablet, Take 1 tablet (6.25 mg total) by mouth 2 (two) times daily., Disp: 180 tablet, Rfl: 3   cetirizine (ZYRTEC) 10 MG tablet, Take 10 mg by mouth daily., Disp: , Rfl:    docusate sodium (COLACE) 100 MG capsule, Take 100 mg by mouth daily as needed for mild constipation or moderate constipation. , Disp: , Rfl:    erythromycin ophthalmic ointment, Place 1 application into the right eye at bedtime., Disp: , Rfl:    Ipratropium-Albuterol (COMBIVENT RESPIMAT) 20-100 MCG/ACT AERS respimat, Inhale 1 puff into the lungs every 6 (six) hours as needed for wheezing or shortness of breath., Disp: , Rfl:    isosorbide mononitrate (IMDUR) 30 MG 24 hr tablet, Take 30 mg by mouth daily. , Disp: , Rfl:    midodrine (PROAMATINE) 10 MG tablet, Take 10 mg by mouth every 8 (eight) hours. , Disp: , Rfl:    Multiple Vitamins-Minerals (THERA-M PO), Take 1 tablet by mouth 2 (two) times daily., Disp: , Rfl:    nitroGLYCERIN (NITROSTAT) 0.4 MG SL tablet, Place 0.4 mg under the tongue every 5 (five) minutes as needed for chest pain., Disp: , Rfl:    pantoprazole (PROTONIX) 40 MG tablet, Take 1 tablet (40 mg total) by mouth  daily., Disp: 60 tablet, Rfl: 0   pregabalin (LYRICA) 75 MG capsule, Take 1 capsule (75 mg total) by mouth daily., Disp: 20 capsule, Rfl: 0   sevelamer carbonate (RENVELA) 800 MG tablet, Take 800 mg by mouth 3 (three) times daily with meals. , Disp: , Rfl:    traMADol (ULTRAM) 50 MG tablet, Take 75 mg by mouth every 8 (eight) hours as needed (pain)., Disp: , Rfl:    fluticasone (FLONASE) 50 MCG/ACT nasal spray, Place 1 spray  into both nostrils daily. , Disp: , Rfl:    guaifenesin (ROBAFEN) 100 MG/5ML syrup, Take 200 mg by mouth 3 (three) times daily as needed for cough., Disp: , Rfl:    hydrocortisone 2.5 % cream, Apply 1 application topically 2 (two) times daily as needed (itching). , Disp: , Rfl:    polyethylene glycol (MIRALAX / GLYCOLAX) packet, Take 17 g by mouth daily as needed for mild constipation. , Disp: , Rfl:    tiotropium (SPIRIVA) 18 MCG inhalation capsule, Place 18 mcg into inhaler and inhale daily., Disp: , Rfl:     Family History  Problem Relation Age of Onset   Leukemia Mother    Heart attack Father    Heart failure Other    Hypertension Other    Leukemia Other    Diabetes Other    Prostate cancer Neg Hx    Kidney cancer Neg Hx    Bladder Cancer Neg Hx      Social History   Tobacco Use   Smoking status: Light Tobacco Smoker    Packs/day: 0.25    Years: 30.00    Pack years: 7.50    Types: Cigarettes   Smokeless tobacco: Never Used   Tobacco comment: 5  Substance Use Topics   Alcohol use: No    Alcohol/week: 0.0 standard drinks   Drug use: No    Comment: has used crack cocaine in past     Allergies as of 08/21/2018 - Review Complete 08/21/2018  Allergen Reaction Noted   Dust mite extract Other (See Comments) 03/01/2015     Imaging Studies: Reviewed  Assessment and Plan:   [name] is a 63 y.o. AA male with multiple medical co-morbidities significant vasculopathy, 3 vessel CAD, EF 16%, ESRD on HD, chronic constipation  and BRBPR. His CAD is currently medically managed. He had h/o PE/DVT on eliquis discontinued in 07/2018 due to acute on chronic anemia, rectal bleeding. He had colonoscopy in 2014, tubular adenomas were removed. Ideally he will need a surveillance colonoscopy due to h/o tubular adenomas but given recent nSTEMI, I recommend to wait for 2-55months and after he is seen by his cardiologist since his recent episode of nSTEMI in 07/2018 for cardiac clearance. Even if we detect colon cancer on colonoscopy, he probably will be a poor candidate for surgery or chemotherapy or both given his multiple comorbidities.  I explained to him that I prefer to wait atleast for 86months prior to performing colonoscopy. I adviced him to continue colace daily for constipation. Increase protonix 40mg  to BID as long as he is on ASA325mg . I recommend palliative care    Follow Up Instructions:   I discussed the assessment and treatment plan with the patient. The patient was provided an opportunity to ask questions and all were answered. The patient agreed with the plan and demonstrated an understanding of the instructions.   The patient was advised to call back or seek an in-person evaluation if the symptoms worsen or if the condition fails to improve as anticipated.  I provided 20 minutes of non-face-to-face time during this encounter.   Follow up in 72months   Cephas Darby, MD

## 2018-08-23 NOTE — ED Notes (Signed)
Per ICU nurse, Hiral, ICU will hang blood. Blood picked up from blood bank by EDT Willow Ora and transported with this RN to unit.

## 2018-08-27 ENCOUNTER — Encounter: Payer: Medicare Other | Attending: Physician Assistant | Admitting: Physician Assistant

## 2018-08-27 ENCOUNTER — Other Ambulatory Visit: Payer: Self-pay

## 2018-08-27 DIAGNOSIS — I7389 Other specified peripheral vascular diseases: Secondary | ICD-10-CM | POA: Insufficient documentation

## 2018-08-27 DIAGNOSIS — I2510 Atherosclerotic heart disease of native coronary artery without angina pectoris: Secondary | ICD-10-CM | POA: Diagnosis not present

## 2018-08-27 DIAGNOSIS — I132 Hypertensive heart and chronic kidney disease with heart failure and with stage 5 chronic kidney disease, or end stage renal disease: Secondary | ICD-10-CM | POA: Insufficient documentation

## 2018-08-27 DIAGNOSIS — Z89512 Acquired absence of left leg below knee: Secondary | ICD-10-CM | POA: Insufficient documentation

## 2018-08-27 DIAGNOSIS — T8789 Other complications of amputation stump: Secondary | ICD-10-CM | POA: Insufficient documentation

## 2018-08-27 DIAGNOSIS — L89899 Pressure ulcer of other site, unspecified stage: Secondary | ICD-10-CM | POA: Diagnosis not present

## 2018-08-27 DIAGNOSIS — F17210 Nicotine dependence, cigarettes, uncomplicated: Secondary | ICD-10-CM | POA: Diagnosis not present

## 2018-08-27 DIAGNOSIS — Z992 Dependence on renal dialysis: Secondary | ICD-10-CM | POA: Diagnosis not present

## 2018-08-27 DIAGNOSIS — I429 Cardiomyopathy, unspecified: Secondary | ICD-10-CM | POA: Insufficient documentation

## 2018-08-27 DIAGNOSIS — G4730 Sleep apnea, unspecified: Secondary | ICD-10-CM | POA: Insufficient documentation

## 2018-08-27 DIAGNOSIS — I5042 Chronic combined systolic (congestive) and diastolic (congestive) heart failure: Secondary | ICD-10-CM | POA: Insufficient documentation

## 2018-08-27 NOTE — Progress Notes (Signed)
Cales, ADMIRAL MARCUCCI ([id number]) Visit Report for 08/27/2018 Chief Complaint Document Details Patient Name: [name], [name]. Date of Service: 08/27/2018 10:00 AM Medical Record Number: [id number] Patient Account Number: [id number] Date of Birth/Sex: 1955/07/11 (62 y.o. M) Treating RN: Army Melia Primary Care Provider: Tomasa Hose Other Clinician: Referring Provider: Tomasa Hose Treating Provider/Extender: Melburn Hake, HOYT Weeks in Treatment: 8 Information Obtained from: Patient Chief Complaint Left stump pressure ulcer Electronic Signature(s) Signed: 08/27/2018 12:21:13 PM By: Worthy Keeler PA-C Entered By: Worthy Keeler on 08/27/2018 10:55:27 Gradillas, [name] ([id number]) -------------------------------------------------------------------------------- HPI Details Patient Name: Mcsorley, Jakaleb N. Date of Service: 08/27/2018 10:00 AM Medical Record Number: [id number] Patient Account Number: [id number] Date of Birth/Sex: 01-04-1956 (62 y.o. M) Treating RN: Army Melia Primary Care Provider: Tomasa Hose Other Clinician: Referring Provider: Tomasa Hose Treating Provider/Extender: Melburn Hake, HOYT Weeks in Treatment: 8 History of Present Illness HPI Description: 63 year old male who was seen by others 2-1/2 years ago when he was being treated for vascular problems and dry gangrene of his right foot now returns with painful lesion on his penis for about a month. He was seen by the physicians assistant at the urology practice,who noted a easily retracted foreskin, patent urethral meatus and on the dorsal surface of the glands there was slough with healthy granulation tissue formation. 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 the patient is a smoker smoking about 10 cigarettes a day. past medical history of amputation of the right foot, left foot amputation, cardiomyopathy, CHF, hemodialysis dependent, coronary  artery disease, tobacco addiction. Seen by Dr. Leotis Pain on 08/29/2014 with ulceration on 2 toes on the right foot and dark discoloration worrisome for gangrenous changes on his 2nd and 3rd toes as well.o Given his previous history of below-knee amputation for peripheral disease and ulceration on the left, his multiple atherosclerotic risk factors and his poorly palpable pedal pulses, he was brought in for angiography for further evaluation and potential treatment. He had a percutaneous angioplasty of the right popliteal artery and the right anterior tibial artery and dorsalis pedis artery. He also had angioplasty of the right peroneal artery and the proximal and mid right posterior tibial artery. On 11/07/2014 he's had a duplex of the right lower extremity which showed the ABI was 0.9 and had a multiphasic waveform. Impression was of mild peripheral vascular disease predominantly small vessel disease right ankle. 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 11/22/2014 -- he has been off cigarettes completely since Saturday and is on a nicotine patch and have commended him about this. 12/08/2014 -- he was seen in the vascular office at [place] and has been scheduled for a angiogram at the end of this month -- next Thursday. I have urged him to keep this appointment. 12/30/2014 -- since I saw him last on July 14 he had a procedure done by Dr. Lucky Cowboy on 12/15/2014. he had a aortogram and selective right lower extremity angiogram followed by a percutaneous angioplasty of the right popliteal artery, right anterior tibial artery, right peroneal artery, proximal and mid right posterior tibial artery. 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 Today he continues to have pain and he  says that some of his  toes are black. Readmission: 06/30/18 on evaluation today patient presents for reevaluation Concerning an altar on the invitation site below his knee. He states this is on the end where there is a ring on the bottom of his shrinker that seems to rather push against this area. Nonetheless this has been open for several weeks and really didn't seem to be getting better which is why he wanted to come to see [place]. Fortunately this does not appear to be too significant and he is having issues with minimal drainage which is good news. There's no signs of infection which is also good news. Overall I'm not too worried about the fact that we should be able to get this area to heal quite readily my biggest issue at this point is simply the fact that the patient is continuing to have what sounds like friction to the stump region which is gonna prevent this from being able to fully close and stay closed. He does sometimes wear his prosthesis although he has not for some time simply due to the fact that again he's had this wound. He does have a history of congestive heart failure, peripheral vascular disease, coronary artery disease, and in stage renal disease for which she is on dialysis. Huron, [name] ([id number]) 07/10/18 on evaluation today patient actually appears to be doing very well in regard to his ulcer on the distal aspect of the left amputation site this actually appears to be doing very well. Fortunately he's been tolerating the dressing changes without complication. There does not appear to be any signs of infection at this time. 07/24/18 on evaluation today patient appears to be doing rather well in regard to his distal imputation site also. With that being said there is a lot of necrotic tissue buildup on the end of the wound I think this is due to too much moisture at the site. Subsequently I did discuss sharp debridement today which hopefully will help this to heal more  appropriately we also need to do some better moisture management in my opinion. 08/04/18 on evaluation today patient appears to be doing much better in regard to his left distal stump ulcer. He has been tolerating the dressing changes without complication. Fortunately there's no signs of infection at this time. Overall I've been very pleased with how things have been progressing since last time I saw him. 08/27/18 on evaluation today patient actually appears to be doing rather well in regard to his left BKA ulcer. He has been tolerating the dressing changes without complication which is excellent news. Overall very pleased in this regard. With that being said there's just a very small area still remaining open at this point that is giving him any trouble at all. For the most part this has done excellent. Electronic Signature(s) Signed: 08/27/2018 12:21:13 PM By: Worthy Keeler PA-C Entered By: Worthy Keeler on 08/27/2018 11:00:35 Conran, [name] ([id number]) -------------------------------------------------------------------------------- Physical Exam Details Patient Name: Gunnarson, Landen N. Date of Service: 08/27/2018 10:00 AM Medical Record Number: [id number] Patient Account Number: [id number] Date of Birth/Sex: [birth year] (62 y.o. M) Treating RN: Army Melia Primary Care Provider: Tomasa Hose Other Clinician: Referring Provider: Tomasa Hose Treating Provider/Extender: STONE III, HOYT Weeks in Treatment: 8 Respiratory normal breathing without difficulty. clear to auscultation bilaterally. Cardiovascular regular rate and rhythm with normal S1, S2. Psychiatric this patient is able to make decisions and demonstrates good insight into disease process. Alert and Oriented x 3. pleasant and cooperative. Notes Patient's wound bed currently  shows signs of good granulation at this time as well is excellent epithelialization. I feel like he's headed in the right direction. With that being said  this is not completely close but is pretty much all but completely close. I think in one more follow-up visit time he'll likely be healed at that point. Electronic Signature(s) Signed: 08/27/2018 12:21:13 PM By: Worthy Keeler PA-C Entered By: Worthy Keeler on 08/27/2018 11:01:03 Harbeson, [name] ([id number]) -------------------------------------------------------------------------------- Physician Orders Details Patient Name: Potash, [name] Date of Service: 08/27/2018 10:00 AM Medical Record Number: [id number] Patient Account Number: [id number] Date of Birth/Sex: Dec 30, 1955 (62 y.o. M) Treating RN: Army Melia Primary Care Provider: Tomasa Hose Other Clinician: Referring Provider: Tomasa Hose Treating Provider/Extender: Melburn Hake, HOYT Weeks in Treatment: 8 Verbal / Phone Orders: No Diagnosis Coding ICD-10 Coding Code Description L98.492 Non-pressure chronic ulcer of skin of other sites with fat layer exposed Z89.512 Acquired absence of left leg below knee I50.42 Chronic combined systolic (congestive) and diastolic (congestive) heart failure I73.89 Other specified peripheral vascular diseases I25.119 Atherosclerotic heart disease of native coronary artery with unspecified angina pectoris Wound Cleansing Wound #6 Left,Midline Amputation Site - Below Knee o Clean wound with Normal Saline. o May Shower, gently pat wound dry prior to applying new dressing. Anesthetic (add to Medication List) Wound #6 Left,Midline Amputation Site - Below Knee o Topical Lidocaine 4% cream applied to wound bed prior to debridement (In Clinic Only). Primary Wound Dressing Wound #6 Left,Midline Amputation Site - Below Knee o Silver Alginate Secondary Dressing Wound #6 Left,Midline Amputation Site - Below Knee o Boardered Foam Dressing Dressing Change Frequency Wound #6 Left,Midline Amputation Site - Below Knee o Change Dressing Monday, Wednesday, Friday - Patient will be seen at [facility] in 2 weeks and will change dressing at that appointment. Al other times, HHRN to change dressing Follow-up Appointments Wound #6 Left,Midline Amputation Site - Below Knee o Return Appointment in 2 weeks. - Patient will be seen at [facility] in 2 weeks and will change dressing at that appointment. Al other times, HHRN to change dressing Off-Loading Wound #6 Left,Midline Amputation Site - Below Knee o Other: - continue not wearing your prosthesis [place] ([id number]) Wound #6 Left,Midline Amputation Site - Below Knee o [place] Visits - Patient will be seen at [facility] in 2 weeks and will change dressing at that appointment. Al other times, HHRN to change dressing o Home Health Nurse may visit PRN to address patientos wound care needs. o FACE TO FACE ENCOUNTER: MEDICARE and MEDICAID PATIENTS: I certify that this patient is under my care and that I had a face-to-face encounter that meets the physician face-to-face encounter requirements with this patient on this date. The encounter with the patient was in whole or in part for the following MEDICAL CONDITION: (primary reason for [place]) MEDICAL NECESSITY: I certify, that based on my findings, NURSING services are a medically necessary home health service. HOME BOUND STATUS: I certify that my clinical findings support that this patient is homebound (i.e., Due to illness or injury, pt requires aid of supportive devices such as crutches, cane, wheelchairs, walkers, the use of special transportation or the assistance of another person to leave their place of residence. There is a normal inability to leave the home and doing so requires considerable and taxing effort. Other absences are for medical reasons / religious services and are infrequent or of short duration when for other reasons). o If  current dressing causes regression in wound condition, may D/C ordered dressing product/s and  apply Normal Saline Moist Dressing daily until next [place] / Other MD appointment. [place] of regression in wound condition at [phone]. o Please direct any NON-WOUND related issues/requests for orders to patient's Primary Care Physician Electronic Signature(s) Signed: 08/27/2018 12:21:13 PM By: Worthy Keeler PA-C Signed: 08/27/2018 12:51:27 PM By: Army Melia Entered By: Army Melia on 08/27/2018 10:57:55 Taillon, [name] ([id number]) -------------------------------------------------------------------------------- Problem List Details Patient Name: Lingelbach, Boomer N. Date of Service: 08/27/2018 10:00 AM Medical Record Number: [id number] Patient Account Number: [id number] Date of Birth/Sex: Mar 03, 1956 (62 y.o. M) Treating RN: Army Melia Primary Care Provider: Tomasa Hose Other Clinician: Referring Provider: Tomasa Hose Treating Provider/Extender: Melburn Hake, HOYT Weeks in Treatment: 8 Active Problems ICD-10 Evaluated Encounter Code Description Active Date Today Diagnosis L98.492 Non-pressure chronic ulcer of skin of other sites with fat layer 06/30/2018 No Yes exposed Z89.512 Acquired absence of left leg below knee 06/30/2018 No Yes I50.42 Chronic combined systolic (congestive) and diastolic 07/26/9189 No Yes (congestive) heart failure I73.89 Other specified peripheral vascular diseases 06/30/2018 No Yes I25.119 Atherosclerotic heart disease of native coronary artery with 06/30/2018 No Yes unspecified angina pectoris Inactive Problems Resolved Problems Electronic Signature(s) Signed: 08/27/2018 12:21:13 PM By: Worthy Keeler PA-C Entered By: Worthy Keeler on 08/27/2018 10:55:15 Prazak, [name] ([id number]) -------------------------------------------------------------------------------- Progress Note Details Patient Name: Nepomuceno, Bakari N. Date of Service: 08/27/2018 10:00 AM Medical Record Number: [id number] Patient Account Number:  [id number] Date of Birth/Sex: September 11, 1955 (62 y.o. M) Treating RN: Army Melia Primary Care Provider: Tomasa Hose Other Clinician: Referring Provider: Tomasa Hose Treating Provider/Extender: Melburn Hake, HOYT Weeks in Treatment: 8 Subjective Chief Complaint Information obtained from Patient Left stump pressure ulcer History of Present Illness (HPI) 63 year old male who was seen by others 2-1/2 years ago when he was being treated for vascular problems and dry gangrene of his right foot now returns with painful lesion on his penis for about a month. He was seen by the physicians assistant at the urology practice,who noted a easily retracted foreskin, patent urethral meatus and on the dorsal surface of the glands there was slough with healthy granulation tissue formation. 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 the patient is a smoker smoking about 10 cigarettes a day. past medical history of amputation of the right foot, left foot amputation, cardiomyopathy, CHF, hemodialysis dependent, coronary artery disease, tobacco addiction. Seen by Dr. Leotis Pain on 08/29/2014 with ulceration on 2 toes on the right foot and dark discoloration worrisome for gangrenous changes on his 2nd and 3rd toes as well. Given his previous history of below-knee amputation for peripheral disease and ulceration on the left, his multiple atherosclerotic risk factors and his poorly palpable pedal pulses, he was brought in for angiography for further evaluation and potential treatment. He had a percutaneous angioplasty of the right popliteal artery and the right anterior tibial artery and dorsalis pedis artery. He also had angioplasty of the right peroneal artery and the proximal and mid right posterior tibial artery. On 11/07/2014 he's had a duplex of the right lower extremity which showed the ABI was 0.9 and had a multiphasic  waveform. Impression was of mild peripheral vascular disease predominantly small vessel disease right ankle. 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 11/22/2014 -- he has been  off cigarettes completely since Saturday and is on a nicotine patch and have commended him about this. 12/08/2014 -- he was seen in the vascular office at [place] and has been scheduled for a angiogram at the end of this month -- next Thursday. I have urged him to keep this appointment. 12/30/2014 -- since I saw him last on July 14 he had a procedure done by Dr. Lucky Cowboy on 12/15/2014. he had a aortogram and selective right lower extremity angiogram followed by a percutaneous angioplasty of the right popliteal artery, right anterior tibial artery, right peroneal artery, proximal and mid right posterior tibial artery. 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 Today he continues to have pain and he says that some of his toes are black. Readmission: 06/30/18 on evaluation today patient presents for reevaluation Concerning an altar on the invitation site below his knee. He states this is on the end where there is a ring on the bottom of his shrinker that seems to rather push against this area. Nonetheless this has been open for several weeks and really didn't seem to be getting better which is why he wanted to come to see [place]. Fortunately this does not appear to be too significant and he is having issues with minimal drainage which is good news. There's no signs of infection which is also good news. Overall I'm not too worried about the fact that we should be able Cornick, BRAYANT DORR ([id number]) to get this area to heal quite readily my biggest issue at this point is simply the fact that the patient is  continuing to have what sounds like friction to the stump region which is gonna prevent this from being able to fully close and stay closed. He does sometimes wear his prosthesis although he has not for some time simply due to the fact that again he's had this wound. He does have a history of congestive heart failure, peripheral vascular disease, coronary artery disease, and in stage renal disease for which she is on dialysis. 07/10/18 on evaluation today patient actually appears to be doing very well in regard to his ulcer on the distal aspect of the left amputation site this actually appears to be doing very well. Fortunately he's been tolerating the dressing changes without complication. There does not appear to be any signs of infection at this time. 07/24/18 on evaluation today patient appears to be doing rather well in regard to his distal imputation site also. With that being said there is a lot of necrotic tissue buildup on the end of the wound I think this is due to too much moisture at the site. Subsequently I did discuss sharp debridement today which hopefully will help this to heal more appropriately we also need to do some better moisture management in my opinion. 08/04/18 on evaluation today patient appears to be doing much better in regard to his left distal stump ulcer. He has been tolerating the dressing changes without complication. Fortunately there's no signs of infection at this time. Overall I've been very pleased with how things have been progressing since last time I saw him. 08/27/18 on evaluation today patient actually appears to be doing rather well in regard to his left BKA ulcer. He has been tolerating the dressing changes without complication which is excellent news. Overall very pleased in this regard.  With that being said there's just a very small area still remaining open at this point that is giving him any trouble at all. For the most part this has done  excellent. Patient History Information obtained from Patient. Family History Cancer - Paternal Grandparents, Diabetes - Father, Heart Disease - Father, Hypertension - Father, No family history of Hereditary Spherocytosis, Kidney Disease, Lung Disease, Seizures, Stroke, Thyroid Problems, Tuberculosis. Social History Current every day smoker, Marital Status - Widowed, Alcohol Use - Never, Drug Use - Prior History, Caffeine Use - Never. Medical History Eyes Denies history of Cataracts, Glaucoma, Optic Neuritis Ear/Nose/Mouth/Throat Denies history of Chronic sinus problems/congestion, Middle ear problems Hematologic/Lymphatic Patient has history of Anemia Denies history of Hemophilia, Human Immunodeficiency Virus, Lymphedema, Sickle Cell Disease Respiratory Patient has history of Asthma, Chronic Obstructive Pulmonary Disease (COPD), Sleep Apnea Denies history of Aspiration, Pneumothorax, Tuberculosis Cardiovascular Patient has history of Congestive Heart Failure, Coronary Artery Disease, Hypertension, Myocardial Infarction, Peripheral Venous Disease Denies history of Angina, Arrhythmia, Deep Vein Thrombosis, Hypotension, Peripheral Arterial Disease, Phlebitis, Vasculitis Gastrointestinal Denies history of Cirrhosis , Colitis, Crohn s, Hepatitis A, Hepatitis B, Hepatitis C Endocrine Denies history of Type I Diabetes, Type II Diabetes Genitourinary Patient has history of End Stage Renal Disease - HD MWF Immunological Denies history of Lupus Erythematosus, Raynaud s, Scleroderma Integumentary (Skin) Furnari, Roth N. ([id number]) Denies history of History of Burn, History of pressure wounds Musculoskeletal Denies history of Gout, Rheumatoid Arthritis, Osteoarthritis, Osteomyelitis Neurologic Patient has history of Neuropathy Denies history of Dementia, Quadriplegia, Paraplegia, Seizure Disorder Oncologic Denies history of Received Chemotherapy, Received Radiation Psychiatric Denies  history of Anorexia/bulimia, Confinement Anxiety Hospitalization/Surgery History - 06/27/2014, [place], L BKA. Medical And Surgical History Notes Musculoskeletal L BKA Review of Systems (ROS) Constitutional Symptoms (General Health) Denies complaints or symptoms of Fever, Chills. Respiratory The patient has no complaints or symptoms. Cardiovascular The patient has no complaints or symptoms. Psychiatric The patient has no complaints or symptoms. Objective Constitutional Vitals Time Taken: 10:32 AM, Height: 74 in, Weight: 210 lbs, BMI: 27, Temperature: 98.3 F, Pulse: 86 bpm, Respiratory Rate: 18 breaths/min, Blood Pressure: 100/59 mmHg. Respiratory normal breathing without difficulty. clear to auscultation bilaterally. Cardiovascular regular rate and rhythm with normal S1, S2. Psychiatric this patient is able to make decisions and demonstrates good insight into disease process. Alert and Oriented x 3. pleasant and cooperative. General Notes: Patient's wound bed currently shows signs of good granulation at this time as well is excellent epithelialization. I feel like he's headed in the right direction. With that being said this is not completely close but is pretty much all but completely close. I think in one more follow-up visit time he'll likely be healed at that point. Integumentary (Hair, Skin) Wound #6 status is Open. Original cause of wound was Pressure Injury. The wound is located on the Left,Midline Amputation Connery, KAVEH KISSINGER ([id number]) Site - Below Knee. The wound measures 0.2cm length x 0.2cm width x 0.1cm depth; 0.031cm^2 area and 0.003cm^3 volume. There is Fat Layer (Subcutaneous Tissue) Exposed exposed. There is no tunneling or undermining noted. There is a small amount of serous drainage noted. The wound margin is flat and intact. There is large (67-100%) pale granulation within the wound bed. There is a small (1-33%) amount of necrotic tissue within the wound bed  including Adherent Slough. The periwound skin appearance had no abnormalities noted for moisture. The periwound skin appearance exhibited: Scarring, Hemosiderin Staining. The periwound skin appearance did not exhibit: Callus, Crepitus, Excoriation, Induration, Rash,  Atrophie Blanche, Cyanosis, Ecchymosis, Mottled, Pallor, Rubor, Erythema. Periwound temperature was noted as No Abnormality. The periwound has tenderness on palpation. Assessment Active Problems ICD-10 Non-pressure chronic ulcer of skin of other sites with fat layer exposed Acquired absence of left leg below knee Chronic combined systolic (congestive) and diastolic (congestive) heart failure Other specified peripheral vascular diseases Atherosclerotic heart disease of native coronary artery with unspecified angina pectoris Plan Wound Cleansing: Wound #6 Left,Midline Amputation Site - Below Knee: Clean wound with Normal Saline. May Shower, gently pat wound dry prior to applying new dressing. Anesthetic (add to Medication List): Wound #6 Left,Midline Amputation Site - Below Knee: Topical Lidocaine 4% cream applied to wound bed prior to debridement (In Clinic Only). Primary Wound Dressing: Wound #6 Left,Midline Amputation Site - Below Knee: Silver Alginate Secondary Dressing: Wound #6 Left,Midline Amputation Site - Below Knee: Boardered Foam Dressing Dressing Change Frequency: Wound #6 Left,Midline Amputation Site - Below Knee: Change Dressing Monday, Wednesday, Friday - Patient will be seen at [facility] in 2 weeks and will change dressing at that appointment. Al other times, HHRN to change dressing Follow-up Appointments: Wound #6 Left,Midline Amputation Site - Below Knee: Return Appointment in 2 weeks. - Patient will be seen at [facility] in 2 weeks and will change dressing at that appointment. Al other times, HHRN to change dressing Off-Loading: Wound #6 Left,Midline Amputation Site - Below Knee: Other: - continue not  wearing your prosthesis Home Health: Wound #6 Left,Midline Amputation Site - Below Knee: Continue Home Health Visits - Patient will be seen at [facility] in 2 weeks and will change dressing at that appointment. Al other times, HHRN to change dressing Krigbaum, LORANCE PICKERAL ([id number]) Home Health Nurse may visit PRN to address patient s wound care needs. FACE TO FACE ENCOUNTER: MEDICARE and MEDICAID PATIENTS: I certify that this patient is under my care and that I had a face-to-face encounter that meets the physician face-to-face encounter requirements with this patient on this date. The encounter with the patient was in whole or in part for the following MEDICAL CONDITION: (primary reason for [place]) MEDICAL NECESSITY: I certify, that based on my findings, NURSING services are a medically necessary home health service. HOME BOUND STATUS: I certify that my clinical findings support that this patient is homebound (i.e., Due to illness or injury, pt requires aid of supportive devices such as crutches, cane, wheelchairs, walkers, the use of special transportation or the assistance of another person to leave their place of residence. There is a normal inability to leave the home and doing so requires considerable and taxing effort. Other absences are for medical reasons / religious services and are infrequent or of short duration when for other reasons). If current dressing causes regression in wound condition, may D/C ordered dressing product/s and apply Normal Saline Moist Dressing daily until next [place] / Other MD appointment. [place] of regression in wound condition at [phone]. Please direct any NON-WOUND related issues/requests for orders to patient's Primary Care Physician I'm gonna suggest a two week follow-up visit for him at which point I expect this to be completely healed hopefully that will be the case. If anything changes or worsens meantime he  will contact the office and let me know. Otherwise my hope is that again this will be ready for discharge has completely closed next visit. Please see above for specific wound care orders. We will see patient for re-evaluation in 2 week(s) here in the clinic. If  anything worsens or changes patient will contact our office for additional recommendations. Electronic Signature(s) Signed: 08/27/2018 12:21:13 PM By: Worthy Keeler PA-C Entered By: Worthy Keeler on 08/27/2018 11:02:00 Crow, [name] ([id number]) -------------------------------------------------------------------------------- ROS/PFSH Details Patient Name: Corrales, Leeland N. Date of Service: 08/27/2018 10:00 AM Medical Record Number: [id number] Patient Account Number: [id number] Date of Birth/Sex: August 10, 1955 (62 y.o. M) Treating RN: Army Melia Primary Care Provider: Tomasa Hose Other Clinician: Referring Provider: Tomasa Hose Treating Provider/Extender: STONE III, HOYT Weeks in Treatment: 8 Information Obtained From Patient Wound History Do you currently have one or more open woundso Yes How many open wounds do you currently haveo 1 Approximately how long have you had your woundso 3 weeks Has your wound(s) ever healed and then re-openedo No Have you had any lab work done in the past montho Yes Have you tested positive for an antibiotic resistant organism (MRSA, VRE)o Yes Have you tested positive for osteomyelitis (bone infection)o No Have you had any tests for circulation on your legso Yes Constitutional Symptoms (General Health) Complaints and Symptoms: Negative for: Fever; Chills Eyes Medical History: Negative for: Cataracts; Glaucoma; Optic Neuritis Ear/Nose/Mouth/Throat Medical History: Negative for: Chronic sinus problems/congestion; Middle ear problems Hematologic/Lymphatic Medical History: Positive for: Anemia Negative for: Hemophilia; Human Immunodeficiency Virus; Lymphedema; Sickle Cell  Disease Respiratory Complaints and Symptoms: No Complaints or Symptoms Medical History: Positive for: Asthma; Chronic Obstructive Pulmonary Disease (COPD); Sleep Apnea Negative for: Aspiration; Pneumothorax; Tuberculosis Cardiovascular Complaints and Symptoms: No Complaints or Symptoms Medical History: Positive for: Congestive Heart Failure; Coronary Artery Disease; Hypertension; Myocardial Infarction; Peripheral Venous Hoheisel, Verne N. ([id number]) Disease Negative for: Angina; Arrhythmia; Deep Vein Thrombosis; Hypotension; Peripheral Arterial Disease; Phlebitis; Vasculitis Gastrointestinal Medical History: Negative for: Cirrhosis ; Colitis; Crohnos; Hepatitis A; Hepatitis B; Hepatitis C Endocrine Medical History: Negative for: Type I Diabetes; Type II Diabetes Genitourinary Medical History: Positive for: End Stage Renal Disease - HD MWF Immunological Medical History: Negative for: Lupus Erythematosus; Raynaudos; Scleroderma Integumentary (Skin) Medical History: Negative for: History of Burn; History of pressure wounds Musculoskeletal Medical History: Negative for: Gout; Rheumatoid Arthritis; Osteoarthritis; Osteomyelitis Past Medical History Notes: L BKA Neurologic Medical History: Positive for: Neuropathy Negative for: Dementia; Quadriplegia; Paraplegia; Seizure Disorder Oncologic Medical History: Negative for: Received Chemotherapy; Received Radiation Psychiatric Complaints and Symptoms: No Complaints or Symptoms Medical History: Negative for: Anorexia/bulimia; Confinement Anxiety Immunizations Pneumococcal Vaccine: Received Pneumococcal Vaccination: Yes Implantable Devices Detamore, Shaman N. ([id number]) No devices added Hospitalization / Surgery History Name of Hospital Purpose of Hospitalization/Surgery Date [facility] L BKA 06/27/2014 Family and Social History Cancer: Yes - Paternal Grandparents; Diabetes: Yes - Father; Heart Disease: Yes - Father; Hereditary  Spherocytosis: No; Hypertension: Yes - Father; Kidney Disease: No; Lung Disease: No; Seizures: No; Stroke: No; Thyroid Problems: No; Tuberculosis: No; Current every day smoker; Marital Status - Widowed; Alcohol Use: Never; Drug Use: Prior History; Caffeine Use: Never; Financial Concerns: No; Food, Clothing or Shelter Needs: No; Support System Lacking: No; Transportation Concerns: No; Advanced Directives: No; Patient does not want information on Advanced Directives Physician Affirmation I have reviewed and agree with the above information. Electronic Signature(s) Signed: 08/27/2018 12:21:13 PM By: Worthy Keeler PA-C Signed: 08/27/2018 12:51:27 PM By: Army Melia Entered By: Worthy Keeler on 08/27/2018 11:00:50 Mahadeo, [name] ([id number]) -------------------------------------------------------------------------------- [place] Details Patient Name: Dillavou, Shubham N. Date of Service: 08/27/2018 Medical Record Number: [id number] Patient Account Number: [id number] Date of Birth/Sex: 09/18/1955 (63 y.o. 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) heart failure I73.89 Other specified peripheral vascular diseases I25.119 Atherosclerotic heart disease of native coronary artery with unspecified angina pectoris Facility Procedures CPT4 Code: 45038882 Description: 80034 - WOUND CARE VISIT-LEV 2 EST PT Modifier: Quantity: 1 Physician Procedures CPT4 Code Description: [id number] - WC PHYS LEVEL 4 - EST PT ICD-10 Diagnosis Description L98.492 Non-pressure chronic ulcer of skin of other sites with fat la Z89.512  Acquired absence of left leg below knee I50.42 Chronic combined systolic  (congestive) and diastolic (congest Y80.16 Other specified peripheral vascular diseases Modifier: yer exposed ive) heart failur Quantity: 1 e Electronic Signature(s) Signed: 08/27/2018 12:21:13 PM By: Worthy Keeler PA-C Entered By: Worthy Keeler on 08/27/2018 11:02:14

## 2018-08-27 NOTE — Progress Notes (Signed)
[name] ([id number]) Visit Report for 08/27/2018 Arrival Information Details Patient Name: [name], [name]. Date of Service: 08/27/2018 10:00 AM Medical Record Number: [id number] Patient Account Number: [id number] Date of Birth/Sex: [birth year] (62 y.o. M) Treating RN: [name] Primary Care [name]: [name] Other Clinician: Referring [name]: [name] Treating [name]/Extender: [name], [name] Weeks in Treatment: 8 Visit Information History Since Last Visit Added or deleted any medications: No Patient Arrived: Wheel Chair Any new allergies or adverse reactions: No Arrival Time: 10:31 Had a fall or experienced change in No Accompanied By: self activities of daily living that may affect Transfer Assistance: None risk of falls: Patient Identification Verified: Yes Signs or symptoms of abuse/neglect since last visito No Secondary Verification Process Yes Hospitalized since last visit: No Completed: Implantable device outside of the clinic excluding No Patient Has Alerts: Yes cellular tissue based products placed in the center Patient Alerts: Patient on Blood since last visit: Thinner Has Dressing in Place as Prescribed: Yes NOT DIABETIC Pain Present Now: No Eliquis Electronic Signature(s) Signed: 08/27/2018 4:09:12 PM By: [name], BSN, RN, CWS, Kim RN, BSN Entered By: [name], BSN, RN, CWS, [name] on 08/27/2018 10:32:14 [name] ([id number]) -------------------------------------------------------------------------------- Clinic Level of Care Assessment Details Patient Name: [name], [name] Date of Service: 08/27/2018 10:00 AM Medical Record Number: [id number] Patient Account Number: [id number] Date of Birth/Sex: 1955-09-26 (62 y.o. M) Treating RN: [name] Primary Care Ellar Hakala: [name] Other Clinician: Referring [name]: [name] Treating [name]/Extender: [name], [name] Weeks in Treatment: 8 Clinic Level of Care Assessment Items TOOL 4  Quantity Score []  - Use when only an EandM is performed on FOLLOW-UP visit 0 ASSESSMENTS - Nursing Assessment / Reassessment X - Reassessment of Co-morbidities (includes updates in patient status) 1 10 X- 1 5 Reassessment of Adherence to Treatment Plan ASSESSMENTS - Wound and Skin Assessment / Reassessment X - Simple Wound Assessment / Reassessment - one wound 1 5 []  - 0 Complex Wound Assessment / Reassessment - multiple wounds []  - 0 Dermatologic / Skin Assessment (not related to wound area) ASSESSMENTS - Focused Assessment []  - Circumferential Edema Measurements - multi extremities 0 []  - 0 Nutritional Assessment / Counseling / Intervention []  - 0 Lower Extremity Assessment (monofilament, tuning fork, pulses) []  - 0 Peripheral Arterial Disease Assessment (using hand held doppler) ASSESSMENTS - Ostomy and/or Continence Assessment and Care []  - Incontinence Assessment and Management 0 []  - 0 Ostomy Care Assessment and Management (repouching, etc.) PROCESS - Coordination of Care X - Simple Patient / Family Education for ongoing care 1 15 []  - 0 Complex (extensive) Patient / Family Education for ongoing care []  - 0 Staff obtains Programmer, systems, Records, Test Results / Process Orders []  - 0 Staff telephones HHA, Nursing Homes / Clarify orders / etc []  - 0 Routine Transfer to another Facility (non-emergent condition) []  - 0 Routine Hospital Admission (non-emergent condition) []  - 0 New Admissions / Biomedical engineer / Ordering NPWT, Apligraf, etc. []  - 0 Emergency Hospital Admission (emergent condition) X- 1 10 Simple Discharge Coordination [name], [name] ([id number]) []  - 0 Complex (extensive) Discharge Coordination PROCESS - Special Needs []  - Pediatric / Minor Patient Management 0 []  - 0 Isolation Patient Management []  - 0 Hearing / Language / Visual special needs []  - 0 Assessment of Community assistance (transportation, D/C planning, etc.) []  - 0 Additional  assistance / Altered mentation []  - 0 Support Surface(s) Assessment (bed, cushion, seat, etc.) INTERVENTIONS - Wound Cleansing / Measurement X -  Simple Wound Cleansing - one wound 1 5 []  - 0 Complex Wound Cleansing - multiple wounds []  - 0 Wound Imaging (photographs - any number of wounds) []  - 0 Wound Tracing (instead of photographs) X- 1 5 Simple Wound Measurement - one wound []  - 0 Complex Wound Measurement - multiple wounds INTERVENTIONS - Wound Dressings X - Small Wound Dressing one or multiple wounds 1 10 []  - 0 Medium Wound Dressing one or multiple wounds []  - 0 Large Wound Dressing one or multiple wounds []  - 0 Application of Medications - topical []  - 0 Application of Medications - injection INTERVENTIONS - Miscellaneous []  - External ear exam 0 []  - 0 Specimen Collection (cultures, biopsies, blood, body fluids, etc.) []  - 0 Specimen(s) / Culture(s) sent or taken to Lab for analysis []  - 0 Patient Transfer (multiple staff / Civil Service fast streamer / Similar devices) []  - 0 Simple Staple / Suture removal (25 or less) []  - 0 Complex Staple / Suture removal (26 or more) []  - 0 Hypo / Hyperglycemic Management (close monitor of Blood Glucose) []  - 0 Ankle / Brachial Index (ABI) - do not check if billed separately X- 1 5 Vital Signs [name], [name] ([id number]) Has the patient been seen at the hospital within the last three years: Yes Total Score: 70 Level Of Care: New/Established - Level 2 Electronic Signature(s) Signed: 08/27/2018 12:51:27 PM By: [name] Entered By: [name] on 08/27/2018 10:58:32 Laufer, [name] ([id number]) -------------------------------------------------------------------------------- Encounter Discharge Information Details Patient Name: [name], [name] Date of Service: 08/27/2018 10:00 AM Medical Record Number: [id number] Patient Account Number: [id number] Date of Birth/Sex: [birth year] (62 y.o. M) Treating RN: [name] Primary Care  Emary Zalar: [name] Other Clinician: Referring Demontre Padin: [name] Treating Keoki Mchargue/Extender: [name], [name] Weeks in Treatment: 8 Encounter Discharge Information Items Discharge Condition: Stable Ambulatory Status: Wheelchair Discharge Destination: Home Transportation: Private Auto Accompanied By: self Schedule Follow-up Appointment: Yes Clinical Summary of Care: Electronic Signature(s) Signed: 08/27/2018 12:51:27 PM By: [name] Entered By: [name] on 08/27/2018 10:59:09 Vilchis, [name] ([id number]) -------------------------------------------------------------------------------- Lower Extremity Assessment Details Patient Name: [name], [name] Date of Service: 08/27/2018 10:00 AM Medical Record Number: [id number] Patient Account Number: [id number] Date of Birth/Sex: 14-Jun-1955 (62 y.o. M) Treating RN: [name] Primary Care Irelynd Zumstein: [name] Other Clinician: Referring Russia Scheiderer: [name] Treating Keison Glendinning/Extender: [name], [name] Weeks in Treatment: 8 Notes BKA Left Electronic Signature(s) Signed: 08/27/2018 4:09:12 PM By: [name], BSN, RN, CWS, Kim RN, BSN Entered By: [name], BSN, RN, CWS, [name] on 08/27/2018 10:37:54 Pires, [name] ([id number]) -------------------------------------------------------------------------------- Multi Wound Chart Details Patient Name: [name], [name] Date of Service: 08/27/2018 10:00 AM Medical Record Number: [id number] Patient Account Number: [id number] Date of Birth/Sex: [birth year] (62 y.o. M) Treating RN: [name] Primary Care Edris Friedt: [name] Other Clinician: Referring Jariah Tarkowski: [name] Treating Breeona Waid/Extender: STONE III, [name] Weeks in Treatment: 8 Vital Signs Height(in): 74 Pulse(bpm): 86 Weight(lbs): 210 Blood Pressure(mmHg): 100/59 Body Mass Index(BMI): 27 Temperature(F): 98.3 Respiratory Rate 18 (breaths/min): Photos: [N/A:N/A] Wound Location: Left Amputation Site - Below N/A N/A Knee  - Midline Wounding Event: Pressure Injury N/A N/A Primary Etiology: Pressure Ulcer N/A N/A Comorbid History: Anemia, Asthma, Chronic N/A N/A Obstructive Pulmonary Disease (COPD), Sleep Apnea, Congestive Heart Failure, Coronary Artery Disease, Hypertension, Myocardial Infarction, Peripheral Venous Disease, End Stage Renal Disease, Neuropathy Date Acquired: 06/09/2018 N/A N/A Weeks of Treatment: 8 N/A N/A Wound Status: Open N/A N/A Measurements L x W x D 0.2x0.2x0.1 N/A N/A (  cm) Area (cm) : 0.031 N/A N/A Volume (cm) : 0.003 N/A N/A % Reduction in Area: 99.70% N/A N/A % Reduction in Volume: 99.70% N/A N/A Classification: Category/Stage III N/A N/A Exudate Amount: Small N/A N/A Exudate Type: Serous N/A N/A Exudate Color: amber N/A N/A Wound Margin: Flat and Intact N/A N/A Granulation Amount: Large (67-100%) N/A N/A Applin, Fortune N. ([id number]) Granulation Quality: Pale N/A N/A Necrotic Amount: Small (1-33%) N/A N/A Exposed Structures: Fat Layer (Subcutaneous N/A N/A Tissue) Exposed: Yes Fascia: No Tendon: No Muscle: No Joint: No Bone: No Epithelialization: Medium (34-66%) N/A N/A Periwound Skin Texture: Scarring: Yes N/A N/A Excoriation: No Induration: No Callus: No Crepitus: No Rash: No Periwound Skin Moisture: Maceration: No N/A N/A Dry/Scaly: No Periwound Skin Color: Hemosiderin Staining: Yes N/A N/A Atrophie Blanche: No Cyanosis: No Ecchymosis: No Erythema: No Mottled: No Pallor: No Rubor: No Temperature: No Abnormality N/A N/A Tenderness on Palpation: Yes N/A N/A Wound Preparation: Ulcer Cleansing: N/A N/A Rinsed/Irrigated with Saline Topical Anesthetic Applied: None Treatment Notes Electronic Signature(s) Signed: 08/27/2018 12:51:27 PM By: [name] Entered By: [name] on 08/27/2018 10:56:46 Tarazon, [name] ([id number]) -------------------------------------------------------------------------------- Coal  Details Patient Name: [name], [name] Date of Service: 08/27/2018 10:00 AM Medical Record Number: [id number] Patient Account Number: [id number] Date of Birth/Sex: [birth year] (62 y.o. M) Treating RN: [name] Primary Care Jashawn Floyd: [name] Other Clinician: Referring Deaysia Grigoryan: [name] Treating Darothy Courtright/Extender: [name], [name] Weeks in Treatment: 8 Active Inactive Abuse / Safety / Falls / Self Care Management Nursing Diagnoses: Impaired physical mobility Goals: Patient will not develop complications from immobility Date Initiated: 06/30/2018 Target Resolution Date: 09/25/2018 Goal Status: Active Interventions: Assess fall risk on admission and as needed Notes: Nutrition Nursing Diagnoses: Potential for alteratiion in Nutrition/Potential for imbalanced nutrition Goals: Patient/caregiver agrees to and verbalizes understanding of need to use nutritional supplements and/or vitamins as prescribed Date Initiated: 06/30/2018 Target Resolution Date: 09/25/2018 Goal Status: Active Interventions: Assess patient nutrition upon admission and as needed per policy Notes: Orientation to the Wound Care Program Nursing Diagnoses: Knowledge deficit related to the wound healing center program Goals: Patient/caregiver will verbalize understanding of the [facility] Program Date Initiated: 06/30/2018 Target Resolution Date: 09/26/2018 Goal Status: Active Interventions: Provide education on orientation to the wound center Amirault, Chasten N. ([id number]) Notes: Wound/Skin Impairment Nursing Diagnoses: Impaired tissue integrity Goals: Ulcer/skin breakdown will heal within 14 weeks Date Initiated: 06/30/2018 Target Resolution Date: 09/25/2018 Goal Status: Active Interventions: Assess patient/caregiver ability to obtain necessary supplies Assess patient/caregiver ability to perform ulcer/skin care regimen upon admission and as needed Assess ulceration(s) every  visit Notes: Electronic Signature(s) Signed: 08/27/2018 12:51:27 PM By: [name] Entered By: [name] on 08/27/2018 10:56:34 Pursley, [name] ([id number]) -------------------------------------------------------------------------------- Pain Assessment Details Patient Name: [name], [name] Date of Service: 08/27/2018 10:00 AM Medical Record Number: [id number] Patient Account Number: [id number] Date of Birth/Sex: July 09, 1955 (62 y.o. M) Treating RN: [name] Primary Care Rosibel Giacobbe: [name] Other Clinician: Referring Arayah Krouse: [name] Treating Laquita Harlan/Extender: [name], [name] Weeks in Treatment: 8 Active Problems Location of Pain Severity and Description of Pain Patient Has Paino No Site Locations Pain Management and Medication Current Pain Management: Electronic Signature(s) Signed: 08/27/2018 4:09:12 PM By: [name], BSN, RN, CWS, Kim RN, BSN Entered By: [name], BSN, RN, CWS, [name] on 08/27/2018 10:32:19 [name], [name] ([id number]) -------------------------------------------------------------------------------- Patient/Caregiver Education Details Patient Name: [name], [name]. Date of Service: 08/27/2018 10:00 AM Medical Record Number: [id number] Patient Account Number: [id number] Date of Birth/Gender:  09-27-1955 (63 y.o. M) Treating RN: [name] Primary Care Physician: [name] Other Clinician: Referring Physician: Tomasa [name] Treating Physician/Extender: Sharalyn Ink in Treatment: 8 Education Assessment Education Provided To: Patient Education Topics Provided Wound/Skin Impairment: Handouts: Caring for Your Ulcer Methods: Demonstration, Explain/Verbal Responses: State content correctly Electronic Signature(s) Signed: 08/27/2018 12:51:27 PM By: [name] Entered By: [name] on 08/27/2018 10:58:45 [name], [name] ([id number]) -------------------------------------------------------------------------------- Wound Assessment  Details Patient Name: [name], [name] Date of Service: 08/27/2018 10:00 AM Medical Record Number: [id number] Patient Account Number: [id number] Date of Birth/Sex: December 30, 1955 (62 y.o. M) Treating RN: [name] Primary Care Matthe Sloane: [name] Other Clinician: Referring Braedyn Riggle: [name] Treating Dyshawn Cangelosi/Extender: STONE III, [name] Weeks in Treatment: 8 Wound Status Wound Number: 6 Primary Pressure Ulcer Etiology: Wound Location: Left Amputation Site - Below Knee - Midline Wound Open Status: Wounding Event: Pressure Injury Comorbid Anemia, Asthma, Chronic Obstructive Pulmonary Date Acquired: 06/09/2018 History: Disease (COPD), Sleep Apnea, Congestive Heart Weeks Of Treatment: 8 Failure, Coronary Artery Disease, Hypertension, Clustered Wound: No Myocardial Infarction, Peripheral Venous Disease, End Stage Renal Disease, Neuropathy Photos Wound Measurements Length: (cm) 0.2 Width: (cm) 0.2 Depth: (cm) 0.1 Area: (cm) 0.031 Volume: (cm) 0.003 % Reduction in Area: 99.7% % Reduction in Volume: 99.7% Epithelialization: Medium (34-66%) Tunneling: No Undermining: No Wound Description Classification: Category/Stage III Foul O Wound Margin: Flat and Intact Slough Exudate Amount: Small Exudate Type: Serous Exudate Color: amber dor After Cleansing: No /Fibrino No Wound Bed Granulation Amount: Large (67-100%) Exposed Structure Granulation Quality: Pale Fascia Exposed: No Necrotic Amount: Small (1-33%) Fat Layer (Subcutaneous Tissue) Exposed: Yes Necrotic Quality: Adherent Slough Tendon Exposed: No Muscle Exposed: No Joint Exposed: No Bone Exposed: No Porcelli, Denham N. ([id number]) Periwound Skin Texture Texture Color No Abnormalities Noted: No No Abnormalities Noted: No Callus: No Atrophie Blanche: No Crepitus: No Cyanosis: No Excoriation: No Ecchymosis: No Induration: No Erythema: No Rash: No Hemosiderin Staining: Yes Scarring: Yes Mottled: No Pallor:  No Moisture Rubor: No No Abnormalities Noted: Yes Temperature / Pain Temperature: No Abnormality Tenderness on Palpation: Yes Wound Preparation Ulcer Cleansing: Rinsed/Irrigated with Saline Topical Anesthetic Applied: None Treatment Notes Wound #6 (Left, Midline Amputation Site - Below Knee) Notes silver cell, bordered foam dressing Electronic Signature(s) Signed: 08/27/2018 4:09:12 PM By: [name], BSN, RN, CWS, Kim RN, BSN Entered By: [name], BSN, RN, CWS, [name] on 08/27/2018 10:37:32 Brennan, [name] ([id number]) -------------------------------------------------------------------------------- Torboy Details Patient Name: Shedden, Robby N. Date of Service: 08/27/2018 10:00 AM Medical Record Number: [id number] Patient Account Number: [id number] Date of Birth/Sex: 12-01-1955 (62 y.o. M) Treating RN: [name] Primary Care Tressie Ragin: [name] Other Clinician: Referring Lorrain Rivers: [name] Treating Kita Neace/Extender: [name], [name] Weeks in Treatment: 8 Vital Signs Time Taken: 10:32 Temperature (F): 98.3 Height (in): 74 Pulse (bpm): 86 Weight (lbs): 210 Respiratory Rate (breaths/min): 18 Body Mass Index (BMI): 27 Blood Pressure (mmHg): 100/59 Reference Range: 80 - 120 mg / dl Electronic Signature(s) Signed: 08/27/2018 4:09:12 PM By: [name], BSN, RN, CWS, Kim RN, BSN Entered By: [name], BSN, RN, CWS, [name] on 08/27/2018 10:33:13

## 2018-09-10 ENCOUNTER — Other Ambulatory Visit: Payer: Self-pay

## 2018-09-10 ENCOUNTER — Encounter: Payer: Medicare Other | Admitting: Physician Assistant

## 2018-09-10 DIAGNOSIS — T8789 Other complications of amputation stump: Secondary | ICD-10-CM | POA: Diagnosis not present

## 2018-09-10 NOTE — Progress Notes (Signed)
[name], Joel HARRIOTT ([id number]) Visit Report for 09/10/2018 Arrival Information Details Patient Name: [name], [name]. Date of Service: 09/10/2018 10:00 AM Medical Record Number: [id number] Patient Account Number: [id number] Date of Birth/Sex: Jul 09, 1955 (62 y.o. M) Treating RN: Montey Hora Primary Care Mayrene Bastarache: Tomasa Hose Other Clinician: Referring Shequilla Goodgame: Tomasa Hose Treating Brett Darko/Extender: Melburn Hake, HOYT Weeks in Treatment: 10 Visit Information History Since Last Visit Added or deleted any medications: No Patient Arrived: Wheel Chair Any new allergies or adverse reactions: No Arrival Time: 10:18 Had a fall or experienced change in No Accompanied By: self activities of daily living that may affect Transfer Assistance: None risk of falls: Patient Identification Verified: Yes Signs or symptoms of abuse/neglect since last visito No Secondary Verification Process Yes Hospitalized since last visit: No Completed: Implantable device outside of the clinic excluding No Patient Has Alerts: Yes cellular tissue based products placed in the center Patient Alerts: Patient on Blood since last visit: Thinner Has Dressing in Place as Prescribed: Yes NOT DIABETIC Pain Present Now: No Eliquis Electronic Signature(s) Signed: 09/10/2018 12:26:37 PM By: Montey Hora Entered By: Montey Hora on 09/10/2018 10:20:32 [name], Joel Punt ([id number]) -------------------------------------------------------------------------------- Clinic Level of Care Assessment Details Patient Name: [name], [name] Date of Service: 09/10/2018 10:00 AM Medical Record Number: [id number] Patient Account Number: [id number] Date of Birth/Sex: 1956-01-23 (62 y.o. M) Treating RN: Montey Hora Primary Care Shallen Luedke: Tomasa Hose Other Clinician: Referring Porchia Sinkler: Tomasa Hose Treating Benicia Bergevin/Extender: Melburn Hake, HOYT Weeks in Treatment: 10 Clinic Level of Care Assessment Items TOOL 4 Quantity  Score []  - Use when only an EandM is performed on FOLLOW-UP visit 0 ASSESSMENTS - Nursing Assessment / Reassessment X - Reassessment of Co-morbidities (includes updates in patient status) 1 10 X- 1 5 Reassessment of Adherence to Treatment Plan ASSESSMENTS - Wound and Skin Assessment / Reassessment X - Simple Wound Assessment / Reassessment - one wound 1 5 []  - 0 Complex Wound Assessment / Reassessment - multiple wounds []  - 0 Dermatologic / Skin Assessment (not related to wound area) ASSESSMENTS - Focused Assessment []  - Circumferential Edema Measurements - multi extremities 0 []  - 0 Nutritional Assessment / Counseling / Intervention X- 1 5 Lower Extremity Assessment (monofilament, tuning fork, pulses) []  - 0 Peripheral Arterial Disease Assessment (using hand held doppler) ASSESSMENTS - Ostomy and/or Continence Assessment and Care []  - Incontinence Assessment and Management 0 []  - 0 Ostomy Care Assessment and Management (repouching, etc.) PROCESS - Coordination of Care X - Simple Patient / Family Education for ongoing care 1 15 []  - 0 Complex (extensive) Patient / Family Education for ongoing care X- 1 10 Staff obtains Programmer, systems, Records, Test Results / Process Orders []  - 0 Staff telephones HHA, Nursing Homes / Clarify orders / etc []  - 0 Routine Transfer to another Facility (non-emergent condition) []  - 0 Routine Hospital Admission (non-emergent condition) []  - 0 New Admissions / Biomedical engineer / Ordering NPWT, Apligraf, etc. []  - 0 Emergency Hospital Admission (emergent condition) X- 1 10 Simple Discharge Coordination Gallardo, [name] ([id number]) []  - 0 Complex (extensive) Discharge Coordination PROCESS - Special Needs []  - Pediatric / Minor Patient Management 0 []  - 0 Isolation Patient Management []  - 0 Hearing / Language / Visual special needs []  - 0 Assessment of Community assistance (transportation, D/C planning, etc.) []  - 0 Additional  assistance / Altered mentation []  - 0 Support Surface(s) Assessment (bed, cushion, seat, etc.) INTERVENTIONS - Wound Cleansing / Measurement X - Simple Wound Cleansing - one wound 1 5 []  -  0 Complex Wound Cleansing - multiple wounds X- 1 5 Wound Imaging (photographs - any number of wounds) []  - 0 Wound Tracing (instead of photographs) X- 1 5 Simple Wound Measurement - one wound []  - 0 Complex Wound Measurement - multiple wounds INTERVENTIONS - Wound Dressings []  - Small Wound Dressing one or multiple wounds 0 []  - 0 Medium Wound Dressing one or multiple wounds []  - 0 Large Wound Dressing one or multiple wounds []  - 0 Application of Medications - topical []  - 0 Application of Medications - injection INTERVENTIONS - Miscellaneous []  - External ear exam 0 []  - 0 Specimen Collection (cultures, biopsies, blood, body fluids, etc.) []  - 0 Specimen(s) / Culture(s) sent or taken to Lab for analysis []  - 0 Patient Transfer (multiple staff / Civil Service fast streamer / Similar devices) []  - 0 Simple Staple / Suture removal (25 or less) []  - 0 Complex Staple / Suture removal (26 or more) []  - 0 Hypo / Hyperglycemic Management (close monitor of Blood Glucose) []  - 0 Ankle / Brachial Index (ABI) - do not check if billed separately X- 1 5 Vital Signs [name], [name] ([id number]) Has the patient been seen at the hospital within the last three years: Yes Total Score: 80 Level Of Care: New/Established - Level 3 Electronic Signature(s) Signed: 09/10/2018 12:26:37 PM By: Montey Hora Entered By: Montey Hora on 09/10/2018 11:55:49 [name], Joel Punt ([id number]) -------------------------------------------------------------------------------- Encounter Discharge Information Details Patient Name: [name], [name] Date of Service: 09/10/2018 10:00 AM Medical Record Number: [id number] Patient Account Number: [id number] Date of Birth/Sex: 18-Jan-1956 (62 y.o. M) Treating RN: Montey Hora Primary  Care Vegas Coffin: Tomasa Hose Other Clinician: Referring Mehtab Dolberry: Tomasa Hose Treating Amiri Tritch/Extender: Melburn Hake, HOYT Weeks in Treatment: 10 Encounter Discharge Information Items Discharge Condition: Stable Ambulatory Status: Wheelchair Discharge Destination: Home Transportation: Private Auto Accompanied By: self Schedule Follow-up Appointment: No Clinical Summary of Care: Electronic Signature(s) Signed: 09/10/2018 12:26:37 PM By: Montey Hora Entered By: Montey Hora on 09/10/2018 11:56:36 Wisener, Joel Punt ([id number]) -------------------------------------------------------------------------------- Lower Extremity Assessment Details Patient Name: Germer, [name] Date of Service: 09/10/2018 10:00 AM Medical Record Number: [id number] Patient Account Number: [id number] Date of Birth/Sex: [birth year] (62 y.o. M) Treating RN: Montey Hora Primary Care Jonaven Hilgers: Tomasa Hose Other Clinician: Referring Odies Desa: Tomasa Hose Treating Jemuel Laursen/Extender: STONE III, HOYT Weeks in Treatment: 10 Edema Assessment Assessed: [Left: No] [Right: No] Edema: [Left: N] [Right: o] Vascular Assessment Pulses: Popliteal Palpable: [Left:Yes] Electronic Signature(s) Signed: 09/10/2018 12:26:37 PM By: Montey Hora Entered By: Montey Hora on 09/10/2018 10:28:45 Teegarden, Joel Punt ([id number]) -------------------------------------------------------------------------------- [place] Details Patient Name: Better, [name] Date of Service: 09/10/2018 10:00 AM Medical Record Number: [id number] Patient Account Number: [id number] Date of Birth/Sex: Jan 02, 1956 (62 y.o. M) Treating RN: Montey Hora Primary Care Kasra Melvin: Tomasa Hose Other Clinician: Referring Leslie Jester: Tomasa Hose Treating Aunna Snooks/Extender: Melburn Hake, HOYT Weeks in Treatment: 10 Active Inactive Electronic Signature(s) Signed: 09/10/2018 12:26:37 PM By: Montey Hora Entered By: Montey Hora on  09/10/2018 11:55:26 Goshorn, Joel Punt ([id number]) -------------------------------------------------------------------------------- Pain Assessment Details Patient Name: Aul, [name] Date of Service: 09/10/2018 10:00 AM Medical Record Number: [id number] Patient Account Number: [id number] Date of Birth/Sex: 1956/03/03 (62 y.o. M) Treating RN: Montey Hora Primary Care Marianny Goris: Tomasa Hose Other Clinician: Referring Munachimso Palin: Tomasa Hose Treating Ermel Verne/Extender: STONE III, HOYT Weeks in Treatment: 10 Active Problems Location of Pain Severity and Description of Pain Patient Has Paino No Site Locations Pain Management and Medication Current Pain Management: Electronic Signature(s) Signed: 09/10/2018 12:26:37 PM By:  Dorthy, Di Kindle Entered By: Montey Hora on 09/10/2018 10:21:51 Dewilde, Joel Punt ([id number]) -------------------------------------------------------------------------------- Patient/Caregiver Education Details Patient Name: Persichetti, Joel Punt. Date of Service: 09/10/2018 10:00 AM Medical Record Number: [id number] Patient Account Number: [id number] Date of Birth/Gender: 02-12-1956 (62 y.o. M) Treating RN: Montey Hora Primary Care Physician: Tomasa Hose Other Clinician: Referring Physician: Tomasa Hose Treating Physician/Extender: Sharalyn Ink in Treatment: 10 Education Assessment Education Provided To: Patient Education Topics Provided Basic Hygiene: Handouts: Other: care of newly healed ulcer site Methods: Demonstration, Explain/Verbal Responses: State content correctly Electronic Signature(s) Signed: 09/10/2018 12:26:37 PM By: Montey Hora Entered By: Montey Hora on 09/10/2018 11:56:18 Hurston, Joel Punt ([id number]) -------------------------------------------------------------------------------- Wound Assessment Details Patient Name: Gitlin, [name] Date of Service: 09/10/2018 10:00 AM Medical Record Number: [id number] Patient Account  Number: [id number] Date of Birth/Sex: Sep 25, 1955 (62 y.o. M) Treating RN: Montey Hora Primary Care Reola Buckles: Tomasa Hose Other Clinician: Referring Anneliese Leblond: Tomasa Hose Treating Roselinda Bahena/Extender: STONE III, HOYT Weeks in Treatment: 10 Wound Status Wound Number: 6 Primary Pressure Ulcer Etiology: Wound Location: Left, Midline Amputation Site - Below Knee Wound Healed - Epithelialized Status: Wounding Event: Pressure Injury Comorbid Anemia, Asthma, Chronic Obstructive Date Acquired: 06/09/2018 History: Pulmonary Disease (COPD), Sleep Apnea, Weeks Of Treatment: 10 Congestive Heart Failure, Coronary Artery Clustered Wound: No Disease, Hypertension, Myocardial Infarction, Peripheral Venous Disease, End Stage Renal Disease, Neuropathy Wound Measurements Length: (cm) 0 % Re Width: (cm) 0 % Re Depth: (cm) 0 Epit Area: (cm) 0 Tun Volume: (cm) 0 Und duction in Area: 100% duction in Volume: 100% helialization: Large (67-100%) neling: No ermining: No Wound Description Classification: Category/Stage III Wound Margin: Flat and Intact Exudate Amount: None Present Foul Odor After Cleansing: No Slough/Fibrino No Wound Bed Granulation Amount: None Present (0%) Exposed Structure Necrotic Amount: None Present (0%) Fascia Exposed: No Fat Layer (Subcutaneous Tissue) Exposed: No Tendon Exposed: No Muscle Exposed: No Joint Exposed: No Bone Exposed: No Limited to Skin Breakdown Periwound Skin Texture Texture Color No Abnormalities Noted: No No Abnormalities Noted: No Callus: No Atrophie Blanche: No Crepitus: No Cyanosis: No Excoriation: No Ecchymosis: No Induration: No Erythema: No Rash: No Hemosiderin Staining: Yes Scarring: Yes Mottled: No Pallor: No Moisture Rubor: No No Abnormalities Noted: Yes Temperature / Pain Mcgloin, [name] ([id number]) Temperature: No Abnormality Tenderness on Palpation: Yes Electronic Signature(s) Signed: 09/10/2018 12:26:37 PM By:  Montey Hora Entered By: Montey Hora on 09/10/2018 10:35:15 Applegate, Joel Punt ([id number]) -------------------------------------------------------------------------------- Vitals Details Patient Name: Spirito, [name] Date of Service: 09/10/2018 10:00 AM Medical Record Number: [id number] Patient Account Number: [id number] Date of Birth/Sex: 1955-07-19 (62 y.o. M) Treating RN: Montey Hora Primary Care Raylynn Hersh: Tomasa Hose Other Clinician: Referring Rosealie Reach: Tomasa Hose Treating Elena Cothern/Extender: STONE III, HOYT Weeks in Treatment: 10 Vital Signs Time Taken: 10:21 Temperature (F): 98.2 Height (in): 74 Pulse (bpm): 68 Weight (lbs): 210 Respiratory Rate (breaths/min): 16 Body Mass Index (BMI): 27 Blood Pressure (mmHg): 128/48 Reference Range: 80 - 120 mg / dl Electronic Signature(s) Signed: 09/10/2018 12:26:37 PM By: Montey Hora Entered By: Montey Hora on 09/10/2018 10:22:58

## 2018-09-10 NOTE — Progress Notes (Signed)
[name], [name] ([id number]) Visit Report for 09/10/2018 Chief Complaint Document Details Patient Name: [name], [name]. Date of Service: 09/10/2018 10:00 AM Medical Record Number: [id number] Patient Account Number: [id number] Date of Birth/Sex: 1956/01/25 (62 y.o. M) Treating RN: Cornell Barman Primary Care Provider: Tomasa Hose Other Clinician: Referring Provider: Tomasa Hose Treating Provider/Extender: Melburn Hake, HOYT Weeks in Treatment: 10 Information Obtained from: Patient Chief Complaint Left stump pressure ulcer Electronic Signature(s) Signed: 09/10/2018 1:13:17 PM By: Worthy Keeler PA-C Entered By: Worthy Keeler on 09/10/2018 10:18:31 [name] ([id number]) -------------------------------------------------------------------------------- HPI Details Patient Name: [name], [name] Date of Service: 09/10/2018 10:00 AM Medical Record Number: [id number] Patient Account Number: [id number] Date of Birth/Sex: [birth year] (62 y.o. M) Treating RN: Cornell Barman Primary Care Provider: Tomasa Hose Other Clinician: Referring Provider: Tomasa Hose Treating Provider/Extender: Melburn Hake, HOYT Weeks in Treatment: 10 History of Present Illness HPI Description: 63 year old male who was seen by others 2-1/2 years ago when he was being treated for vascular problems and dry gangrene of his right foot now returns with painful lesion on his penis for about a month. He was seen by the physicians assistant at the urology practice,who noted a easily retracted foreskin, patent urethral meatus and on the dorsal surface of the glands there was slough with healthy granulation tissue formation. 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 the patient is a smoker smoking about 10 cigarettes a day. past medical history of amputation of the right foot, left foot amputation, cardiomyopathy, CHF, hemodialysis dependent, coronary  artery disease, tobacco addiction. Seen by Dr. Leotis Pain on 08/29/2014 with ulceration on 2 toes on the right foot and dark discoloration worrisome for gangrenous changes on his 2nd and 3rd toes as well.o Given his previous history of below-knee amputation for peripheral disease and ulceration on the left, his multiple atherosclerotic risk factors and his poorly palpable pedal pulses, he was brought in for angiography for further evaluation and potential treatment. He had a percutaneous angioplasty of the right popliteal artery and the right anterior tibial artery and dorsalis pedis artery. He also had angioplasty of the right peroneal artery and the proximal and mid right posterior tibial artery. On 11/07/2014 he's had a duplex of the right lower extremity which showed the ABI was 0.9 and had a multiphasic waveform. Impression was of mild peripheral vascular disease predominantly small vessel disease right ankle. 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 11/22/2014 -- he has been off cigarettes completely since Saturday and is on a nicotine patch and have commended him about this. 12/08/2014 -- he was seen in the vascular office at [place] and has been scheduled for a angiogram at the end of this month -- next Thursday. I have urged him to keep this appointment. 12/30/2014 -- since I saw him last on July 14 he had a procedure done by Dr. Lucky Cowboy on 12/15/2014. he had a aortogram and selective right lower extremity angiogram followed by a percutaneous angioplasty of the right popliteal artery, right anterior tibial artery, right peroneal artery, proximal and mid right posterior tibial artery. 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 Today he continues to have pain and he  says that some of his  toes are black. Readmission: 06/30/18 on evaluation today patient presents for reevaluation Concerning an altar on the invitation site below his knee. He states this is on the end where there is a ring on the bottom of his shrinker that seems to rather push against this area. Nonetheless this has been open for several weeks and really didn't seem to be getting better which is why he wanted to come to see [place]. Fortunately this does not appear to be too significant and he is having issues with minimal drainage which is good news. There's no signs of infection which is also good news. Overall I'm not too worried about the fact that we should be able to get this area to heal quite readily my biggest issue at this point is simply the fact that the patient is continuing to have what sounds like friction to the stump region which is gonna prevent this from being able to fully close and stay closed. He does sometimes wear his prosthesis although he has not for some time simply due to the fact that again he's had this wound. He does have a history of congestive heart failure, peripheral vascular disease, coronary artery disease, and in stage renal disease for which she is on dialysis. [name], [name] ([id number]) 07/10/18 on evaluation today patient actually appears to be doing very well in regard to his ulcer on the distal aspect of the left amputation site this actually appears to be doing very well. Fortunately he's been tolerating the dressing changes without complication. There does not appear to be any signs of infection at this time. 07/24/18 on evaluation today patient appears to be doing rather well in regard to his distal imputation site also. With that being said there is a lot of necrotic tissue buildup on the end of the wound I think this is due to too much moisture at the site. Subsequently I did discuss sharp debridement today which hopefully will help this to heal more  appropriately we also need to do some better moisture management in my opinion. 08/04/18 on evaluation today patient appears to be doing much better in regard to his left distal stump ulcer. He has been tolerating the dressing changes without complication. Fortunately there's no signs of infection at this time. Overall I've been very pleased with how things have been progressing since last time I saw him. 08/27/18 on evaluation today patient actually appears to be doing rather well in regard to his left BKA ulcer. He has been tolerating the dressing changes without complication which is excellent news. Overall very pleased in this regard. With that being said there's just a very small area still remaining open at this point that is giving him any trouble at all. For the most part this has done excellent. 09/10/18 on evaluation today patient actually appears to be completely healed in regard to his left below the knee amputation wound. He has been tolerating the dressing changes without complication overall has done excellent in my pinion. At this point he is definitely where we want them to be as far as discharge is concerned although I still have some concerns about how quickly he should be put back into a prosthesis. Electronic Signature(s) Signed: 09/10/2018 1:13:17 PM By: Worthy Keeler PA-C Entered By: Worthy Keeler on 09/10/2018 10:53:14 Ozburn, [name] ([id number]) -------------------------------------------------------------------------------- Physical Exam Details Patient Name: Matsuura, Johnell N. Date of Service: 09/10/2018 10:00 AM Medical Record Number: [id number] Patient Account Number: [id number] Date of Birth/Sex: 06/05/1955 (62  y.o. M) Treating RN: Cornell Barman Primary Care Provider: Tomasa Hose Other Clinician: Referring Provider: Tomasa Hose Treating Provider/Extender: STONE III, HOYT Weeks in Treatment: 110 Constitutional Well-nourished and well-hydrated in no acute  distress. Respiratory normal breathing without difficulty. clear to auscultation bilaterally. Cardiovascular regular rate and rhythm with normal S1, S2. Psychiatric this patient is able to make decisions and demonstrates good insight into disease process. Alert and Oriented x 3. pleasant and cooperative. Notes Patient's wound again shows complete epithelialization at this time. Overall I'm extremely pleased with how things appear in that regard. He does have some edema of the left below knee amputation stop which physical therapy does want to get a shrinker back on. With that being said I'm not sure how quickly we should jump to doing this especially since fluid will often find the weakest area to come out of in August see one of the weakest areas in his lower extremity at this point is the newly healed wound region. Electronic Signature(s) Signed: 09/10/2018 1:13:17 PM By: Worthy Keeler PA-C Entered By: Worthy Keeler on 09/10/2018 10:53:56 [name], [name] ([id number]) -------------------------------------------------------------------------------- Physician Orders Details Patient Name: Ballester, [name]. Date of Service: 09/10/2018 10:00 AM Medical Record Number: [id number] Patient Account Number: [id number] Date of Birth/Sex: August 01, 1955 (62 y.o. M) Treating RN: Montey Hora Primary Care Provider: Tomasa Hose Other Clinician: Referring Provider: Tomasa Hose Treating Provider/Extender: Melburn Hake, HOYT Weeks in Treatment: 10 Verbal / Phone Orders: No Diagnosis Coding ICD-10 Coding Code Description L98.492 Non-pressure chronic ulcer of skin of other sites with fat layer exposed Z89.512 Acquired absence of left leg below knee I50.42 Chronic combined systolic (congestive) and diastolic (congestive) heart failure I73.89 Other specified peripheral vascular diseases I25.119 Atherosclerotic heart disease of native coronary artery with unspecified angina pectoris Discharge From [facility]  Services o Discharge from [place] - Please continue protecting area until shrinker is reapplied. May start using shrinker in 1 month and start wearing prosthesis in 2 months Electronic Signature(s) Signed: 09/10/2018 12:26:37 PM By: Montey Hora Signed: 09/10/2018 1:13:17 PM By: Worthy Keeler PA-C Entered By: Montey Hora on 09/10/2018 10:36:14 [name], [name] ([id number]) -------------------------------------------------------------------------------- Problem List Details Patient Name: Arcilla, Segundo N. Date of Service: 09/10/2018 10:00 AM Medical Record Number: [id number] Patient Account Number: [id number] Date of Birth/Sex: 06-Jul-1955 (62 y.o. M) Treating RN: Cornell Barman Primary Care Provider: Tomasa Hose Other Clinician: Referring Provider: Tomasa Hose Treating Provider/Extender: Melburn Hake, HOYT Weeks in Treatment: 10 Active Problems ICD-10 Evaluated Encounter Code Description Active Date Today Diagnosis L98.492 Non-pressure chronic ulcer of skin of other sites with fat layer 06/30/2018 No Yes exposed Z89.512 Acquired absence of left leg below knee 06/30/2018 No Yes I50.42 Chronic combined systolic (congestive) and diastolic 0/01/3234 No Yes (congestive) heart failure I73.89 Other specified peripheral vascular diseases 06/30/2018 No Yes I25.119 Atherosclerotic heart disease of native coronary artery with 06/30/2018 No Yes unspecified angina pectoris Inactive Problems Resolved Problems Electronic Signature(s) Signed: 09/10/2018 1:13:17 PM By: Worthy Keeler PA-C Entered By: Worthy Keeler on 09/10/2018 10:18:17 [name], [name] ([id number]) -------------------------------------------------------------------------------- Progress Note Details Patient Name: [name], [name] Date of Service: 09/10/2018 10:00 AM Medical Record Number: [id number] Patient Account Number: [id number] Date of Birth/Sex: [birth year] (62 y.o. M) Treating RN: Cornell Barman Primary Care Provider:  Tomasa Hose Other Clinician: Referring Provider: Tomasa Hose Treating Provider/Extender: Melburn Hake, HOYT Weeks in Treatment: 10 Subjective Chief Complaint Information obtained from Patient Left stump pressure ulcer History of Present Illness (HPI) 64 year old male who was  seen by others 2-1/2 years ago when he was being treated for vascular problems and dry gangrene of his right foot now returns with painful lesion on his penis for about a month. He was seen by the physicians assistant at the urology practice,who noted a easily retracted foreskin, patent urethral meatus and on the dorsal surface of the glands there was slough with healthy granulation tissue formation. 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 the patient is a smoker smoking about 10 cigarettes a day. past medical history of amputation of the right foot, left foot amputation, cardiomyopathy, CHF, hemodialysis dependent, coronary artery disease, tobacco addiction. Seen by Dr. Leotis Pain on 08/29/2014 with ulceration on 2 toes on the right foot and dark discoloration worrisome for gangrenous changes on his 2nd and 3rd toes as well. Given his previous history of below-knee amputation for peripheral disease and ulceration on the left, his multiple atherosclerotic risk factors and his poorly palpable pedal pulses, he was brought in for angiography for further evaluation and potential treatment. He had a percutaneous angioplasty of the right popliteal artery and the right anterior tibial artery and dorsalis pedis artery. He also had angioplasty of the right peroneal artery and the proximal and mid right posterior tibial artery. On 11/07/2014 he's had a duplex of the right lower extremity which showed the ABI was 0.9 and had a multiphasic waveform. Impression was of mild peripheral vascular disease predominantly small vessel disease right ankle. 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 11/22/2014 -- he has been off cigarettes completely since Saturday and is on a nicotine patch and have commended him about this. 12/08/2014 -- he was seen in the vascular office at [place] and has been scheduled for a angiogram at the end of this month -- next Thursday. I have urged him to keep this appointment. 12/30/2014 -- since I saw him last on July 14 he had a procedure done by Dr. Lucky Cowboy on 12/15/2014. he had a aortogram and selective right lower extremity angiogram followed by a percutaneous angioplasty of the right popliteal artery, right anterior tibial artery, right peroneal artery, proximal and mid right posterior tibial artery. 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 Today he continues to have pain and he says that some of his toes are black. Readmission: 06/30/18 on evaluation today patient presents for reevaluation Concerning an altar on the invitation site below his knee. He states this is on the end where there is a ring on the bottom of his shrinker that seems to rather push against this area. Nonetheless this has been open for several weeks and really didn't seem to be getting better which is why he wanted to come to see [place]. Fortunately this does not appear to be too significant and he is having issues with minimal drainage which is good news. There's no signs of infection which is also good news. Overall I'm not too worried about the fact that we should be able [name], [name] ([id number]) to get this area to heal quite readily my biggest issue at this point is simply the fact that the patient is continuing to have what sounds like friction to the stump region which is gonna prevent  this from being able to fully close  and stay closed. He does sometimes wear his prosthesis although he has not for some time simply due to the fact that again he's had this wound. He does have a history of congestive heart failure, peripheral vascular disease, coronary artery disease, and in stage renal disease for which she is on dialysis. 07/10/18 on evaluation today patient actually appears to be doing very well in regard to his ulcer on the distal aspect of the left amputation site this actually appears to be doing very well. Fortunately he's been tolerating the dressing changes without complication. There does not appear to be any signs of infection at this time. 07/24/18 on evaluation today patient appears to be doing rather well in regard to his distal imputation site also. With that being said there is a lot of necrotic tissue buildup on the end of the wound I think this is due to too much moisture at the site. Subsequently I did discuss sharp debridement today which hopefully will help this to heal more appropriately we also need to do some better moisture management in my opinion. 08/04/18 on evaluation today patient appears to be doing much better in regard to his left distal stump ulcer. He has been tolerating the dressing changes without complication. Fortunately there's no signs of infection at this time. Overall I've been very pleased with how things have been progressing since last time I saw him. 08/27/18 on evaluation today patient actually appears to be doing rather well in regard to his left BKA ulcer. He has been tolerating the dressing changes without complication which is excellent news. Overall very pleased in this regard. With that being said there's just a very small area still remaining open at this point that is giving him any trouble at all. For the most part this has done excellent. 09/10/18 on evaluation today patient actually appears to be completely healed in regard to his left below the knee  amputation wound. He has been tolerating the dressing changes without complication overall has done excellent in my pinion. At this point he is definitely where we want them to be as far as discharge is concerned although I still have some concerns about how quickly he should be put back into a prosthesis. Patient History Information obtained from Patient. Family History Cancer - Paternal Grandparents, Diabetes - Father, Heart Disease - Father, Hypertension - Father, No family history of Hereditary Spherocytosis, Kidney Disease, Lung Disease, Seizures, Stroke, Thyroid Problems, Tuberculosis. Social History Current every day smoker, Marital Status - Widowed, Alcohol Use - Never, Drug Use - Prior History, Caffeine Use - Never. Medical History Eyes Denies history of Cataracts, Glaucoma, Optic Neuritis Ear/Nose/Mouth/Throat Denies history of Chronic sinus problems/congestion, Middle ear problems Hematologic/Lymphatic Patient has history of Anemia Denies history of Hemophilia, Human Immunodeficiency Virus, Lymphedema, Sickle Cell Disease Respiratory Patient has history of Asthma, Chronic Obstructive Pulmonary Disease (COPD), Sleep Apnea Denies history of Aspiration, Pneumothorax, Tuberculosis Cardiovascular Patient has history of Congestive Heart Failure, Coronary Artery Disease, Hypertension, Myocardial Infarction, Peripheral Venous Disease Denies history of Angina, Arrhythmia, Deep Vein Thrombosis, Hypotension, Peripheral Arterial Disease, Phlebitis, Vasculitis Gastrointestinal Denies history of Cirrhosis , Colitis, Crohn s, Hepatitis A, Hepatitis B, Hepatitis C Endocrine Denies history of Type I Diabetes, Type II Diabetes Papandrea, Novah N. ([id number]) Genitourinary Patient has history of End Stage Renal Disease - HD MWF Immunological Denies history of Lupus Erythematosus, Raynaud s, Scleroderma Integumentary (Skin) Denies history of History of Burn, History of pressure  wounds Musculoskeletal Denies history of Gout, Rheumatoid Arthritis, Osteoarthritis, Osteomyelitis Neurologic Patient has history of Neuropathy Denies history of Dementia, Quadriplegia, Paraplegia, Seizure Disorder Oncologic Denies history of Received Chemotherapy, Received Radiation Psychiatric Denies history of Anorexia/bulimia, Confinement Anxiety Hospitalization/Surgery History - L BKA. Medical And Surgical History Notes Musculoskeletal L BKA Review of Systems (ROS) Constitutional Symptoms (General Health) Denies complaints or symptoms of Fatigue, Fever, Chills, Marked Weight Change. Respiratory Denies complaints or symptoms of Chronic or frequent coughs, Shortness of Breath. Cardiovascular Complains or has symptoms of LE edema. Denies complaints or symptoms of Chest pain. Psychiatric Denies complaints or symptoms of Anxiety, Claustrophobia. Objective Constitutional Well-nourished and well-hydrated in no acute distress. Vitals Time Taken: 10:21 AM, Height: 74 in, Weight: 210 lbs, BMI: 27, Temperature: 98.2 F, Pulse: 68 bpm, Respiratory Rate: 16 breaths/min, Blood Pressure: 128/48 mmHg. Respiratory normal breathing without difficulty. clear to auscultation bilaterally. Cardiovascular regular rate and rhythm with normal S1, S2. Psychiatric this patient is able to make decisions and demonstrates good insight into disease process. Alert and Oriented x 3. pleasant and cooperative. [name], [name] ([id number]) General Notes: Patient's wound again shows complete epithelialization at this time. Overall I'm extremely pleased with how things appear in that regard. He does have some edema of the left below knee amputation stop which physical therapy does want to get a shrinker back on. With that being said I'm not sure how quickly we should jump to doing this especially since fluid will often find the weakest area to come out of in August see one of the weakest areas in his lower  extremity at this point is the newly healed wound region. Integumentary (Hair, Skin) Wound #6 status is Healed - Epithelialized. Original cause of wound was Pressure Injury. The wound is located on the Left,Midline Amputation Site - Below Knee. The wound measures 0cm length x 0cm width x 0cm depth; 0cm^2 area and 0cm^3 volume. The wound is limited to skin breakdown. There is no tunneling or undermining noted. There is a none present amount of drainage noted. The wound margin is flat and intact. There is no granulation within the wound bed. There is no necrotic tissue within the wound bed. The periwound skin appearance had no abnormalities noted for moisture. The periwound skin appearance exhibited: Scarring, Hemosiderin Staining. The periwound skin appearance did not exhibit: Callus, Crepitus, Excoriation, Induration, Rash, Atrophie Blanche, Cyanosis, Ecchymosis, Mottled, Pallor, Rubor, Erythema. Periwound temperature was noted as No Abnormality. The periwound has tenderness on palpation. Assessment Active Problems ICD-10 Non-pressure chronic ulcer of skin of other sites with fat layer exposed Acquired absence of left leg below knee Chronic combined systolic (congestive) and diastolic (congestive) heart failure Other specified peripheral vascular diseases Atherosclerotic heart disease of native coronary artery with unspecified angina pectoris Plan Discharge From [facility]: Discharge from [place] - Please continue protecting area until shrinker is reapplied. May start using shrinker in 1 month and start wearing prosthesis in 2 months My suggestion at this point is that we discontinue wound care services. And I'm gonna recommend a protective patch of the area just to keep things under control over the next month. After one months time I am okay with the patient starting to use the shrinker at that point. Following then I would wait another month for a total of two months time from  today before we initiating the use of the prosthesis by physical therapy. Patients in agreement that plan. If anything changes or worsens the meantime will contact the  office let me know otherwise will see were things stand at follow-up only as needed at this point. Electronic Signature(s) Signed: 09/10/2018 1:13:17 PM By: Worthy Keeler PA-C Entered By: Worthy Keeler on 09/10/2018 10:54:41 Stith, [name] ([id number]) [name], [name] ([id number]) -------------------------------------------------------------------------------- ROS/PFSH Details Patient Name: [name], [name] Date of Service: 09/10/2018 10:00 AM Medical Record Number: [id number] Patient Account Number: [id number] Date of Birth/Sex: [birth year] (62 y.o. M) Treating RN: Cornell Barman Primary Care Provider: Tomasa Hose Other Clinician: Referring Provider: Tomasa Hose Treating Provider/Extender: Melburn Hake, HOYT Weeks in Treatment: 10 Information Obtained From Patient Constitutional Symptoms (General Health) Complaints and Symptoms: Negative for: Fatigue; Fever; Chills; Marked Weight Change Respiratory Complaints and Symptoms: Negative for: Chronic or frequent coughs; Shortness of Breath Medical History: Positive for: Asthma; Chronic Obstructive Pulmonary Disease (COPD); Sleep Apnea Negative for: Aspiration; Pneumothorax; Tuberculosis Cardiovascular Complaints and Symptoms: Positive for: LE edema Negative for: Chest pain Medical History: Positive for: Congestive Heart Failure; Coronary Artery Disease; Hypertension; Myocardial Infarction; Peripheral Venous Disease Negative for: Angina; Arrhythmia; Deep Vein Thrombosis; Hypotension; Peripheral Arterial Disease; Phlebitis; Vasculitis Psychiatric Complaints and Symptoms: Negative for: Anxiety; Claustrophobia Medical History: Negative for: Anorexia/bulimia; Confinement Anxiety Eyes Medical History: Negative for: Cataracts; Glaucoma; Optic  Neuritis Ear/Nose/Mouth/Throat Medical History: Negative for: Chronic sinus problems/congestion; Middle ear problems Hematologic/Lymphatic Medical History: Positive for: Anemia Brauer, Makoa N. ([id number]) Negative for: Hemophilia; Human Immunodeficiency Virus; Lymphedema; Sickle Cell Disease Gastrointestinal Medical History: Negative for: Cirrhosis ; Colitis; Crohnos; Hepatitis A; Hepatitis B; Hepatitis C Endocrine Medical History: Negative for: Type I Diabetes; Type II Diabetes Genitourinary Medical History: Positive for: End Stage Renal Disease - HD MWF Immunological Medical History: Negative for: Lupus Erythematosus; Raynaudos; Scleroderma Integumentary (Skin) Medical History: Negative for: History of Burn; History of pressure wounds Musculoskeletal Medical History: Negative for: Gout; Rheumatoid Arthritis; Osteoarthritis; Osteomyelitis Past Medical History Notes: L BKA Neurologic Medical History: Positive for: Neuropathy Negative for: Dementia; Quadriplegia; Paraplegia; Seizure Disorder Oncologic Medical History: Negative for: Received Chemotherapy; Received Radiation Immunizations Pneumococcal Vaccine: Received Pneumococcal Vaccination: Yes Implantable Devices No devices added Hospitalization / Surgery History Type of Hospitalization/Surgery L BKA Family and Social History Cancer: Yes - Paternal Grandparents; Diabetes: Yes - Father; Heart Disease: Yes - Father; Hereditary Spherocytosis: No; Hypertension: Yes - Father; Kidney Disease: No; Lung Disease: No; Seizures: No; Stroke: No; Thyroid Problems: No; Grzywacz, Perlie N. ([id number]) Tuberculosis: No; Current every day smoker; Marital Status - Widowed; Alcohol Use: Never; Drug Use: Prior History; Caffeine Use: Never; Financial Concerns: No; Food, Clothing or Shelter Needs: No; Support System Lacking: No; Transportation Concerns: No Physician Affirmation I have reviewed and agree with the above  information. Electronic Signature(s) Signed: 09/10/2018 1:13:17 PM By: Worthy Keeler PA-C Signed: 09/10/2018 3:23:10 PM By: Gretta Cool, BSN, RN, CWS, Kim RN, BSN Entered By: Worthy Keeler on 09/10/2018 10:53:35 Jensen, [name] ([id number]) -------------------------------------------------------------------------------- SuperBill Details Patient Name: Blatchford, [name]. Date of Service: 09/10/2018 Medical Record Number: [id number] Patient Account Number: [id number] Date of Birth/Sex: [birth year] (63 y.o. M) Treating RN: Cornell Barman Primary Care Provider: Tomasa Hose Other Clinician: Referring Provider: Tomasa Hose Treating Provider/Extender: Melburn Hake, HOYT Weeks in Treatment: 10 Diagnosis Coding ICD-10 Codes Code Description L98.492 Non-pressure chronic ulcer of skin of other sites with fat layer exposed Z89.512 Acquired absence of left leg below knee I50.42 Chronic combined systolic (congestive) and diastolic (congestive) heart failure I73.89 Other specified peripheral vascular diseases I25.119 Atherosclerotic heart disease of native coronary artery with unspecified angina pectoris Facility Procedures CPT4 Code: 62263335 Description:  05183 - WOUND CARE VISIT-LEV 3 EST PT Modifier: Quantity: 1 Physician Procedures CPT4 Code Description: 3582518 98421 - WC PHYS LEVEL 4 - EST PT ICD-10 Diagnosis Description L98.492 Non-pressure chronic ulcer of skin of other sites with fat la Z89.512 Acquired absence of left leg below knee I50.42 Chronic combined systolic  (congestive) and diastolic (congest I31.28 Other specified peripheral vascular diseases Modifier: yer exposed ive) heart failur Quantity: 1 e Electronic Signature(s) Signed: 09/10/2018 12:26:37 PM By: Montey Hora Signed: 09/10/2018 1:13:17 PM By: Worthy Keeler PA-C Entered By: Montey Hora on 09/10/2018 11:55:56

## 2018-09-15 ENCOUNTER — Encounter (INDEPENDENT_AMBULATORY_CARE_PROVIDER_SITE_OTHER): Payer: Medicare Other

## 2018-09-15 ENCOUNTER — Ambulatory Visit (INDEPENDENT_AMBULATORY_CARE_PROVIDER_SITE_OTHER): Payer: Medicare Other | Admitting: Vascular Surgery

## 2018-09-20 ENCOUNTER — Other Ambulatory Visit: Payer: Self-pay

## 2018-09-20 ENCOUNTER — Encounter: Payer: Self-pay | Admitting: Emergency Medicine

## 2018-09-20 ENCOUNTER — Emergency Department: Payer: Medicaid Other

## 2018-09-20 ENCOUNTER — Emergency Department
Admission: EM | Admit: 2018-09-20 | Discharge: 2018-09-20 | Disposition: A | Discharge status: Nursing Home (Includes ICF) | Payer: Medicaid Other | Attending: Emergency Medicine | Admitting: Emergency Medicine

## 2018-09-20 DIAGNOSIS — Z79899 Other long term (current) drug therapy: Secondary | ICD-10-CM | POA: Insufficient documentation

## 2018-09-20 DIAGNOSIS — I5022 Chronic systolic (congestive) heart failure: Secondary | ICD-10-CM | POA: Insufficient documentation

## 2018-09-20 DIAGNOSIS — Z7982 Long term (current) use of aspirin: Secondary | ICD-10-CM | POA: Diagnosis not present

## 2018-09-20 DIAGNOSIS — N186 End stage renal disease: Secondary | ICD-10-CM | POA: Diagnosis not present

## 2018-09-20 DIAGNOSIS — F17210 Nicotine dependence, cigarettes, uncomplicated: Secondary | ICD-10-CM | POA: Diagnosis not present

## 2018-09-20 DIAGNOSIS — Z992 Dependence on renal dialysis: Secondary | ICD-10-CM | POA: Insufficient documentation

## 2018-09-20 DIAGNOSIS — R079 Chest pain, unspecified: Secondary | ICD-10-CM | POA: Diagnosis present

## 2018-09-20 DIAGNOSIS — J449 Chronic obstructive pulmonary disease, unspecified: Secondary | ICD-10-CM | POA: Insufficient documentation

## 2018-09-20 DIAGNOSIS — I132 Hypertensive heart and chronic kidney disease with heart failure and with stage 5 chronic kidney disease, or end stage renal disease: Secondary | ICD-10-CM | POA: Diagnosis not present

## 2018-09-20 LAB — CBC
HCT: 37.2 % — ABNORMAL LOW (ref 39.0–52.0)
Hemoglobin: 11.5 g/dL — ABNORMAL LOW (ref 13.0–17.0)
MCH: 29 pg (ref 26.0–34.0)
MCHC: 30.9 g/dL (ref 30.0–36.0)
MCV: 93.7 fL (ref 80.0–100.0)
Platelets: 211 10*3/uL (ref 150–400)
RBC: 3.97 MIL/uL — ABNORMAL LOW (ref 4.22–5.81)
RDW: 16.9 % — ABNORMAL HIGH (ref 11.5–15.5)
WBC: 7.9 10*3/uL (ref 4.0–10.5)
nRBC: 0 % (ref 0.0–0.2)

## 2018-09-20 LAB — COMPREHENSIVE METABOLIC PANEL
ALT: 25 U/L (ref 0–44)
AST: 35 U/L (ref 15–41)
Albumin: 3.1 g/dL — ABNORMAL LOW (ref 3.5–5.0)
Alkaline Phosphatase: 118 U/L (ref 38–126)
Anion gap: 12 (ref 5–15)
BUN: 43 mg/dL — ABNORMAL HIGH (ref 8–23)
CO2: 26 mmol/L (ref 22–32)
Calcium: 9.6 mg/dL (ref 8.9–10.3)
Chloride: 101 mmol/L (ref 98–111)
Creatinine, Ser: 8.68 mg/dL — ABNORMAL HIGH (ref 0.61–1.24)
GFR calc Af Amer: 7 mL/min — ABNORMAL LOW (ref >60)
GFR calc non Af Amer: 6 mL/min — ABNORMAL LOW (ref >60)
Glucose, Bld: 157 mg/dL — ABNORMAL HIGH (ref 70–99)
Potassium: 3.7 mmol/L (ref 3.5–5.1)
Sodium: 139 mmol/L (ref 135–145)
Total Bilirubin: 0.6 mg/dL (ref 0.3–1.2)
Total Protein: 7.9 g/dL (ref 6.5–8.1)

## 2018-09-20 LAB — TROPONIN I
Troponin I: 0.11 ng/mL (ref <0.03)
Troponin I: 0.12 ng/mL (ref <0.03)

## 2018-09-20 MED ORDER — ASPIRIN 81 MG PO CHEW
324.0000 mg | CHEWABLE_TABLET | Freq: Once | ORAL | Status: AC
  Administered 2018-09-20: 324 mg via ORAL
  Filled 2018-09-20: qty 4

## 2018-09-20 MED ORDER — NITROGLYCERIN 0.4 MG SL SUBL
0.4000 mg | SUBLINGUAL_TABLET | SUBLINGUAL | Status: DC | PRN
  Administered 2018-09-20: 0.4 mg via SUBLINGUAL
  Filled 2018-09-20: qty 1

## 2018-09-20 NOTE — ED Triage Notes (Signed)
EMS pt tto rm 17 from the [facility] with co chest pain and shortness of breath. Pt is dialysis pt on MWF and had dialysis on Friday.

## 2018-09-20 NOTE — ED Provider Notes (Signed)
[facility] Emergency Department Provider Note  Time seen: 12:07 AM  I have reviewed the triage vital signs and the nursing notes.   HISTORY  Chief Complaint Chest Pain   HPI [name] is a 63 y.o. male with a past medical history of cardiomyopathy, CHF, COPD, CAD, MI, stent, hypertension, hyperlipidemia, end-stage renal disease on hemodialysis Monday/Wednesday/Friday, presents to the emergency department for chest pain and shortness of breath.  Patient denies any fever.  Denies any cough.  Patient states around 11 PM tonight he began experiencing 10/10 central chest pain with radiation into the left arm as well as shortness of breath.  Denies any diaphoresis.  No nausea.  Patient received a full dialysis session on Friday.  States the chest pain is easing off some describes a mild dull pain in his left arm with 5/10 chest pain currently.   Past Medical History:  Diagnosis Date  . 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 Amputee, below knee, left ([place])   . Anemia   . Asthma   . Cardiomyopathy ([place])   . CHF (congestive heart failure) ([place])   . Chronic systolic heart failure ([place])   . Complication of anesthesia    hypotension  . COPD (chronic obstructive pulmonary disease) ([place])   . Coronary artery disease   . Dialysis patient ([place])    Mon, Wed, Fri  . End stage renal disease ([place])   . GERD (gastroesophageal reflux disease)   . Headache   . History of kidney stones   . History of pulmonary embolism   . HLD (hyperlipidemia)   . HTN (hypertension)   . Hyperparathyroidism   . Myocardial infarction ([place])   . Peripheral vascular disease ([place])   . Shortness of breath dyspnea   . Sleep apnea    NO C-PAP, Patient stated in process of  "getting one"   . Tobacco dependence     Patient Active Problem List   Diagnosis Date Noted  . Non-STEMI (non-ST elevated myocardial infarction) ([place]) 08/10/2018  . Acute GI bleeding   . HCAP  (healthcare-associated pneumonia) 04/22/2018  . Acute on chronic respiratory failure with hypoxia ([place]) 12/21/2017  . Tobacco use 10/07/2017  . Apnea   . End-stage renal disease on hemodialysis ([place])   . Hypervolemia   . Altered mental status   . Acute on chronic systolic CHF (congestive heart failure) ([place]) 01/18/2017  . Pulmonary embolism ([place]) 08/03/2016  . NSTEMI (non-ST elevated myocardial infarction) ([place]) 07/26/2016  . PVD (peripheral vascular disease) ([place]) 03/05/2016  . Preop examination 05/02/2015  . Chronic systolic heart failure ([place])   . Hyperkalemia 01/13/2015  . Normocytic anemia 05/04/2014  . Hyperlipidemia 04/26/2010  . HYPERTENSION, BENIGN 04/26/2010  . CAD, NATIVE VESSEL 04/26/2010    Past Surgical History:  Procedure Laterality Date  . A/V FISTULAGRAM Left 08/14/2017   Procedure: A/V FISTULAGRAM;  Surgeon: Algernon Huxley, MD;  Location: [place] [facility];  Service: Cardiovascular;  Laterality: Left;  . AMPUTATION Left 05/06/2014   Procedure: AMPUTATION BELOW KNEE;  Surgeon: Elam Dutch, MD;  Location: [place];  Service: Vascular;  Laterality: Left;  . AMPUTATION Right 01/12/2015   Procedure: Foot transmetatarsal amputation;  Surgeon: Algernon Huxley, MD;  Location: [facility];  Service: Vascular;  Laterality: Right;  . APPLICATION OF WOUND VAC Right 03/01/2015   Procedure: Application of Bio-connekt graft and wound vac application to right foot ;  Surgeon: Algernon Huxley, MD;  Location:  [facility];  Service: Vascular;  Laterality: Right;  . AV FISTULA PLACEMENT Left   . AV FISTULA PLACEMENT Left 11/28/2016   Procedure: ARTERIOVENOUS (AV) FISTULA CREATION;  Surgeon: Algernon Huxley, MD;  Location: [facility];  Service: Vascular;  Laterality: Left;  . CARDIAC CATHETERIZATION     stent placement   . CORONARY ANGIOPLASTY    . DIALYSIS/PERMA CATHETER INSERTION N/A 07/31/2016   Procedure: Dialysis/Perma Catheter Insertion;  Surgeon: Algernon Huxley, MD;  Location: [place] [facility];   Service: Cardiovascular;  Laterality: N/A;  . DIALYSIS/PERMA CATHETER INSERTION N/A 07/25/2017   Procedure: DIALYSIS/PERMA CATHETER INSERTION and fistulagram;  Surgeon: Algernon Huxley, MD;  Location: [place] [facility];  Service: Cardiovascular;  Laterality: N/A;  . DIALYSIS/PERMA CATHETER REMOVAL N/A 07/22/2017   Procedure: DIALYSIS/PERMA CATHETER REMOVAL;  Surgeon: Katha Cabal, MD;  Location: [place] [facility];  Service: Cardiovascular;  Laterality: N/A;  . DIALYSIS/PERMA CATHETER REMOVAL N/A 02/05/2018   Procedure: DIALYSIS/PERMA CATHETER REMOVAL;  Surgeon: Algernon Huxley, MD;  Location: [place] [facility];  Service: Cardiovascular;  Laterality: N/A;  . IR FLUORO GUIDE CV LINE LEFT  04/10/2017  . LEFT HEART CATH AND CORONARY ANGIOGRAPHY N/A 08/11/2018   Procedure: LEFT HEART CATH AND CORONARY ANGIOGRAPHY;  Surgeon: Corey Skains, MD;  Location: [place] [facility];  Service: Cardiovascular;  Laterality: N/A;  . LIGATION OF ARTERIOVENOUS  FISTULA Right 01/31/2016   Procedure: LIGATION OF ARTERIOVENOUS  FISTULA;  Surgeon: Algernon Huxley, MD;  Location: [facility];  Service: Vascular;  Laterality: Right;  . PERIPHERAL VASCULAR CATHETERIZATION Right 12/15/2014   Procedure: Lower Extremity Angiography;  Surgeon: Algernon Huxley, MD;  Location: [place] [facility];  Service: Cardiovascular;  Laterality: Right;  . PERIPHERAL VASCULAR CATHETERIZATION  12/15/2014   Procedure: Lower Extremity Intervention;  Surgeon: Algernon Huxley, MD;  Location: [place] [facility];  Service: Cardiovascular;;  . PERIPHERAL VASCULAR CATHETERIZATION Right 08/14/2015   Procedure: A/V Shuntogram/Fistulagram;  Surgeon: Algernon Huxley, MD;  Location: [place] [facility];  Service: Cardiovascular;  Laterality: Right;  . PERIPHERAL VASCULAR CATHETERIZATION N/A 08/14/2015   Procedure: A/V Shunt Intervention;  Surgeon: Algernon Huxley, MD;  Location: [place] [facility];  Service: Cardiovascular;  Laterality: N/A;  . PERIPHERAL VASCULAR  CATHETERIZATION N/A 01/11/2016   Procedure: Dialysis/Perma Catheter Insertion;  Surgeon: Algernon Huxley, MD;  Location: [place] [facility];  Service: Cardiovascular;  Laterality: N/A;  . REVISON OF ARTERIOVENOUS FISTULA Right 02/17/2016   Procedure: removal of AV fistula;  Surgeon: Serafina Mitchell, MD;  Location: [facility];  Service: Vascular;  Laterality: Right;  . REVISON OF ARTERIOVENOUS FISTULA Right 01/31/2016   Procedure: REVISON OF ARTERIOVENOUS FISTULA ( BRACHIOCEPHALIC ) W/ ARTEGRAFT;  Surgeon: Algernon Huxley, MD;  Location: [facility];  Service: Vascular;  Laterality: Right;  . TRANSMETATARSAL AMPUTATION Right 05/04/2015   Procedure: TRANSMETATARSAL AMPUTATION REVISION, great toe amputation;  Surgeon: Algernon Huxley, MD;  Location: [facility];  Service: Vascular;  Laterality: Right;    Prior to Admission medications   Medication Sig Start Date End Date Taking? Authorizing Provider  acetaminophen (TYLENOL) 500 MG tablet Take 500 mg by mouth every 6 (six) hours as needed for mild pain.     [provider]  albuterol (PROVENTIL HFA;VENTOLIN HFA) 108 (90 Base) MCG/ACT inhaler Inhale 2 puffs into the lungs every 6 (six) hours as needed for wheezing or shortness of breath.     [provider]  albuterol (PROVENTIL) (2.5 MG/3ML) 0.083% nebulizer solution Take 3 mLs (2.5 mg total) by nebulization every 6 (six) hours as needed for wheezing or shortness of breath. 04/24/18   Gladstone Lighter, MD  aspirin EC 325 MG tablet Take 325 mg by mouth daily.     [provider]  atorvastatin (LIPITOR) 10 MG tablet Take 10 mg by mouth at bedtime.     [provider]  budesonide-formoterol (SYMBICORT) 160-4.5 MCG/ACT inhaler Inhale 2 puffs into the lungs 2 (two) times daily.    [provider]  calcium acetate (PHOSLO) 667 MG capsule Take 667 mg by mouth daily.     [provider]  carvedilol (COREG) 6.25 MG tablet Take 1 tablet (6.25 mg total) by mouth 2 (two) times  daily. 10/07/17   Alisa Graff, FNP  cetirizine (ZYRTEC) 10 MG tablet Take 10 mg by mouth daily.    [provider]  docusate sodium (COLACE) 100 MG capsule Take 100 mg by mouth daily as needed for mild constipation or moderate constipation.     [provider]  erythromycin ophthalmic ointment Place 1 application into the right eye at bedtime.    [provider]  fluticasone (FLONASE) 50 MCG/ACT nasal spray Place 1 spray into both nostrils daily.     [provider]  guaifenesin (ROBAFEN) 100 MG/5ML syrup Take 200 mg by mouth 3 (three) times daily as needed for cough.    [provider]  hydrocortisone 2.5 % cream Apply 1 application topically 2 (two) times daily as needed (itching).     [provider]  Ipratropium-Albuterol (COMBIVENT RESPIMAT) 20-100 MCG/ACT AERS respimat Inhale 1 puff into the lungs every 6 (six) hours as needed for wheezing or shortness of breath.    [provider]  isosorbide mononitrate (IMDUR) 30 MG 24 hr tablet Take 30 mg by mouth daily.  08/22/16   [provider]  midodrine (PROAMATINE) 10 MG tablet Take 10 mg by mouth every 8 (eight) hours.     [provider]  Multiple Vitamins-Minerals (THERA-M PO) Take 1 tablet by mouth 2 (two) times daily.    [provider]  nitroGLYCERIN (NITROSTAT) 0.4 MG SL tablet Place 0.4 mg under the tongue every 5 (five) minutes as needed for chest pain.    [provider]  pantoprazole (PROTONIX) 40 MG tablet Take 1 tablet (40 mg total) by mouth daily. 08/18/18   Hillary Bow, MD  polyethylene glycol (MIRALAX / GLYCOLAX) packet Take 17 g by mouth daily as needed for mild constipation.     [provider]  pregabalin (LYRICA) 75 MG capsule Take 1 capsule (75 mg total) by mouth daily. 01/22/17   Gladstone Lighter, MD  sevelamer carbonate (RENVELA) 800 MG tablet Take 800 mg by mouth 3 (three) times daily with meals.     [provider]  tiotropium (SPIRIVA) 18 MCG inhalation capsule Place 18 mcg into inhaler and inhale daily.    [provider]  traMADol (ULTRAM) 50 MG tablet Take 75 mg by mouth every 8 (eight) hours as needed (pain).    [provider]    Allergies  Allergen Reactions  . Dust Mite Extract Other (See Comments)    Reaction: unknown    Family History  Problem Relation Age of Onset  . Leukemia Mother   . Heart attack Father   . Heart failure Other   . Hypertension Other   . Leukemia Other   . Diabetes Other   .  Prostate cancer Neg Hx   . Kidney cancer Neg Hx   . Bladder Cancer Neg Hx     Social History Social History   Tobacco Use  . Smoking status: Light Tobacco Smoker    Packs/day: 0.25    Years: 30.00    Pack years: 7.50    Types: Cigarettes  . Smokeless tobacco: Never Used  . Tobacco comment: 5  Substance Use Topics  . Alcohol use: No    Alcohol/week: 0.0 standard drinks  . Drug use: No    Comment: has used crack cocaine in past     Review of Systems Constitutional: Negative for fever. ENT: Negative for recent illness/congestion Cardiovascular: Positive for chest pain with left arm radiation. Respiratory: Positive for shortness of breath. Gastrointestinal: Negative for abdominal pain, vomiting Musculoskeletal: Left BKA. Skin: Negative for skin complaints  Neurological: Negative for headache All other ROS negative  ____________________________________________   PHYSICAL EXAM:  VITAL SIGNS: ED Triage Vitals [09/20/18 0006]  Enc Vitals Group     BP (!) 116/102     Pulse Rate 86     Resp (!) 30     Temp 98.3 F (36.8 C)     Temp Source Oral     SpO2 100 %     Weight      Height      Head Circumference      Peak Flow      Pain Score      Pain Loc      Pain Edu?      Excl. in [place]?     Constitutional: Alert and oriented.  Slight distress due to shortness of breath/chest pain. Eyes: Normal exam ENT      Head: Normocephalic and  atraumatic.      Mouth/Throat: Mucous membranes are moist. Cardiovascular: Normal rate, regular rhythm. Respiratory: Normal respiratory effort without tachypnea nor retractions. Breath sounds are clear  Gastrointestinal: Soft and nontender. No distention.   Musculoskeletal: Status post left BKA. Neurologic:  Normal speech and language. No gross focal neurologic deficits Skin:  Skin is warm, dry and intact.  Psychiatric: Mood and affect are normal.   ____________________________________________    EKG  EKG viewed and interpreted by myself shows a normal sinus rhythm 87 bpm with a widened QRS, normal axis, largely normal intervals, patient does have slight ST elevation in aVR with depression in the lateral leads, however this is largely unchanged from prior EKG.  ____________________________________________    RADIOLOGY  Chest x-ray shows vascular congestion.  ____________________________________________   INITIAL IMPRESSION / ASSESSMENT AND PLAN / ED COURSE  Pertinent labs & imaging results that were available during my care of the patient were reviewed by me and considered in my medical decision making (see chart for details).   Patient presents emergency department for chest discomfort and shortness of breath starting approximately 1 hour ago.  We will dose nitroglycerin, aspirin, EKG is largely unchanged from prior.  We will obtain lab work and a chest x-ray.  Differential would include ACS, chest wall pain, pulmonary edema/CHF exacerbation/fluid overload, angina.  Chest x-ray shows vascular congestion.  Patient's labs are largely at baseline troponin is slightly elevated 0.12 however this is significantly down from his last value of 4.2 in which the patient suffered an NSTEMI.  I reviewed the patient's records, he had a catheterization showing significant disease of multiple vessels however no stents placed and medical management was recommended.  Patient states that chest pain  has completely resolved at  this time.  Patient is agreeable for repeat troponin, if repeat troponin is largely unchanged anticipate likely discharge home.  Patient has significant disease however with a week's and catheterization and medical management being recommended I do not believe inpatient admission is necessarily needed for the patient as long as his troponin is not uptrending.  Patient agreeable to plan of care and wishes to go home if possible.  Patient's repeat troponin 0 0.11.  Patient continues to be chest pain-free and wishes to go home.  I believe the patient would be safe for discharge home at this time with outpatient follow-up.  Patient agreeable to plan of care.  I discussed my normal chest pain return precautions.  [name] was evaluated in Emergency Department on 09/20/2018 for the symptoms described in the history of present illness. He was evaluated in the context of the global COVID-19 pandemic, which necessitated consideration that the patient might be at risk for infection with the SARS-CoV-2 virus that causes COVID-19. Institutional protocols and algorithms that pertain to the evaluation of patients at risk for COVID-19 are in a state of rapid change based on information released by regulatory bodies including the CDC and federal and state organizations. These policies and algorithms were followed during the patient's care in the ED.  ____________________________________________   FINAL CLINICAL IMPRESSION(S) / ED DIAGNOSES  Chest pain Shortness of breath   Harvest Dark, MD 09/20/18 [phone]

## 2018-10-27 ENCOUNTER — Ambulatory Visit (INDEPENDENT_AMBULATORY_CARE_PROVIDER_SITE_OTHER): Payer: Medicare Other

## 2018-10-27 ENCOUNTER — Ambulatory Visit (INDEPENDENT_AMBULATORY_CARE_PROVIDER_SITE_OTHER): Payer: Medicare Other | Admitting: Vascular Surgery

## 2018-10-27 ENCOUNTER — Other Ambulatory Visit: Payer: Self-pay

## 2018-10-27 ENCOUNTER — Encounter (INDEPENDENT_AMBULATORY_CARE_PROVIDER_SITE_OTHER): Payer: Self-pay | Admitting: Vascular Surgery

## 2018-10-27 VITALS — BP 130/70 | HR 72 | Resp 16

## 2018-10-27 DIAGNOSIS — I739 Peripheral vascular disease, unspecified: Secondary | ICD-10-CM

## 2018-10-27 DIAGNOSIS — Z79899 Other long term (current) drug therapy: Secondary | ICD-10-CM

## 2018-10-27 DIAGNOSIS — N186 End stage renal disease: Secondary | ICD-10-CM | POA: Diagnosis not present

## 2018-10-27 DIAGNOSIS — I120 Hypertensive chronic kidney disease with stage 5 chronic kidney disease or end stage renal disease: Secondary | ICD-10-CM

## 2018-10-27 DIAGNOSIS — F17210 Nicotine dependence, cigarettes, uncomplicated: Secondary | ICD-10-CM

## 2018-10-27 DIAGNOSIS — Z992 Dependence on renal dialysis: Secondary | ICD-10-CM | POA: Diagnosis not present

## 2018-10-27 DIAGNOSIS — E785 Hyperlipidemia, unspecified: Secondary | ICD-10-CM

## 2018-10-27 DIAGNOSIS — I10 Essential (primary) hypertension: Secondary | ICD-10-CM

## 2018-10-27 NOTE — Assessment & Plan Note (Signed)
Although the flow volumes on duplex today are on the low side, no obvious focal stenosis is seen.  Clinically, he is having no issue with the fistula and it is working well.  At this point, I think a check in 1 year would be appropriate unless worsening problems develop on dialysis.

## 2018-10-27 NOTE — Progress Notes (Signed)
MRN : [id number]  [name] is a 63 y.o. (1955-08-30) male who presents with chief complaint of  Chief Complaint  Patient presents with  . Follow-up    45month ultrasound follow up  .  History of Present Illness: Patient returns today in follow up of his dialysis access and his PAD.  He is doing well today without any obvious complaints.  He says they are having no issues with his left arm access at dialysis.  It is running well with good flow and no prolonged bleeding.  Duplex today shows some reduction in the flow volume but no obvious focal stenoses. He is also assessed for his PAD today.  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  We have done intervention on the right leg as well as digital amputations.  No current wounds or ulceration on the right foot.  His right ABI is noncompressible but he has pretty good waveform and pressure distally.  Current Outpatient Medications  Medication Sig Dispense Refill  . acetaminophen (TYLENOL) 500 MG tablet Take 500 mg by mouth every 6 (six) hours as needed for mild pain.     [name] albuterol (PROVENTIL HFA;VENTOLIN HFA) 108 (90 Base) MCG/ACT inhaler Inhale 2 puffs into the lungs every 6 (six) hours as needed for wheezing or shortness of breath.     [name] albuterol (PROVENTIL) (2.5 MG/3ML) 0.083% nebulizer solution Take 3 mLs (2.5 mg total) by nebulization every 6 (six) hours as needed for wheezing or shortness of breath. 300 mL 1  . aspirin EC 325 MG tablet Take 325 mg by mouth daily.     [name] atorvastatin (LIPITOR) 10 MG tablet Take 10 mg by mouth at bedtime.     . budesonide-formoterol (SYMBICORT) 160-4.5 MCG/ACT inhaler Inhale 2 puffs into the lungs 2 (two) times daily.    . calcium acetate (PHOSLO) 667 MG capsule Take 667 mg by mouth daily.     . carvedilol (COREG) 6.25 MG tablet Take 1 tablet (6.25 mg total) by mouth 2 (two) times daily. 180 tablet 3  . cetirizine (ZYRTEC) 10 MG tablet Take 10 mg by mouth daily.    [name] docusate  sodium (COLACE) 100 MG capsule Take 100 mg by mouth daily as needed for mild constipation or moderate constipation.     [name] erythromycin ophthalmic ointment Place 1 application into the right eye at bedtime.    . fluticasone (FLONASE) 50 MCG/ACT nasal spray Place 1 spray into both nostrils daily.     [name] guaifenesin (ROBAFEN) 100 MG/5ML syrup Take 200 mg by mouth 3 (three) times daily as needed for cough.    . hydrocortisone 2.5 % cream Apply 1 application topically 2 (two) times daily as needed (itching).     . Ipratropium-Albuterol (COMBIVENT RESPIMAT) 20-100 MCG/ACT AERS respimat Inhale 1 puff into the lungs every 6 (six) hours as needed for wheezing or shortness of breath.    . isosorbide mononitrate (IMDUR) 30 MG 24 hr tablet Take 30 mg by mouth daily.     . midodrine (PROAMATINE) 10 MG tablet Take 10 mg by mouth every 8 (eight) hours.     . Multiple Vitamins-Minerals (THERA-M PO) Take 1 tablet by mouth 2 (two) times daily.    . nitroGLYCERIN (NITROSTAT) 0.4 MG SL tablet Place 0.4 mg under the tongue every 5 (five) minutes as needed for chest pain.    . pantoprazole (PROTONIX) 40 MG tablet Take 1 tablet (40 mg total) by mouth daily. [place]  tablet 0  . polyethylene glycol (MIRALAX / GLYCOLAX) packet Take 17 g by mouth daily as needed for mild constipation.     . pregabalin (LYRICA) 75 MG capsule Take 1 capsule (75 mg total) by mouth daily. 20 capsule 0  . sevelamer carbonate (RENVELA) 800 MG tablet Take 800 mg by mouth 3 (three) times daily with meals.     . tiotropium (SPIRIVA) 18 MCG inhalation capsule Place 18 mcg into inhaler and inhale daily.    . traMADol (ULTRAM) 50 MG tablet Take 75 mg by mouth every 8 (eight) hours as needed (pain).     No current facility-administered medications for this visit.     Past Medical History:  Diagnosis Date  . 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 Amputee, below knee, left ([place])   . Anemia   . Asthma   . Cardiomyopathy ([place])   . CHF  (congestive heart failure) ([place])   . Chronic systolic heart failure ([place])   . Complication of anesthesia    hypotension  . COPD (chronic obstructive pulmonary disease) ([place])   . Coronary artery disease   . Dialysis patient ([place])    Mon, Wed, Fri  . End stage renal disease ([place])   . GERD (gastroesophageal reflux disease)   . Headache   . History of kidney stones   . History of pulmonary embolism   . HLD (hyperlipidemia)   . HTN (hypertension)   . Hyperparathyroidism   . Myocardial infarction ([place])   . Peripheral vascular disease ([place])   . Shortness of breath dyspnea   . Sleep apnea    NO C-PAP, Patient stated in process of  "getting one"   . Tobacco dependence     Past Surgical History:  Procedure Laterality Date  . A/V FISTULAGRAM Left 08/14/2017   Procedure: A/V FISTULAGRAM;  Surgeon: Algernon Huxley, MD;  Location: [place] [facility];  Service: Cardiovascular;  Laterality: Left;  . AMPUTATION Left 05/06/2014   Procedure: AMPUTATION BELOW KNEE;  Surgeon: Elam Dutch, MD;  Location: [place];  Service: Vascular;  Laterality: Left;  . AMPUTATION Right 01/12/2015   Procedure: Foot transmetatarsal amputation;  Surgeon: Algernon Huxley, MD;  Location: [facility];  Service: Vascular;  Laterality: Right;  . APPLICATION OF WOUND VAC Right 03/01/2015   Procedure: Application of Bio-connekt graft and wound vac application to right foot ;  Surgeon: Algernon Huxley, MD;  Location: [facility];  Service: Vascular;  Laterality: Right;  . AV FISTULA PLACEMENT Left   . AV FISTULA PLACEMENT Left 11/28/2016   Procedure: ARTERIOVENOUS (AV) FISTULA CREATION;  Surgeon: Algernon Huxley, MD;  Location: [facility];  Service: Vascular;  Laterality: Left;  . CARDIAC CATHETERIZATION     stent placement   . CORONARY ANGIOPLASTY    . DIALYSIS/PERMA CATHETER INSERTION N/A 07/31/2016   Procedure: Dialysis/Perma Catheter Insertion;  Surgeon: Algernon Huxley, MD;  Location: [place] [facility];  Service: Cardiovascular;  Laterality:  N/A;  . DIALYSIS/PERMA CATHETER INSERTION N/A 07/25/2017   Procedure: DIALYSIS/PERMA CATHETER INSERTION and fistulagram;  Surgeon: Algernon Huxley, MD;  Location: [place] [facility];  Service: Cardiovascular;  Laterality: N/A;  . DIALYSIS/PERMA CATHETER REMOVAL N/A 07/22/2017   Procedure: DIALYSIS/PERMA CATHETER REMOVAL;  Surgeon: Katha Cabal, MD;  Location: [place] [facility];  Service: Cardiovascular;  Laterality: N/A;  . DIALYSIS/PERMA CATHETER REMOVAL N/A 02/05/2018   Procedure: DIALYSIS/PERMA CATHETER REMOVAL;  Surgeon: Algernon Huxley, MD;  Location:  [place] [facility];  Service: Cardiovascular;  Laterality: N/A;  . IR FLUORO GUIDE CV LINE LEFT  04/10/2017  . LEFT HEART CATH AND CORONARY ANGIOGRAPHY N/A 08/11/2018   Procedure: LEFT HEART CATH AND CORONARY ANGIOGRAPHY;  Surgeon: Corey Skains, MD;  Location: [place] [facility];  Service: Cardiovascular;  Laterality: N/A;  . LIGATION OF ARTERIOVENOUS  FISTULA Right 01/31/2016   Procedure: LIGATION OF ARTERIOVENOUS  FISTULA;  Surgeon: Algernon Huxley, MD;  Location: [facility];  Service: Vascular;  Laterality: Right;  . PERIPHERAL VASCULAR CATHETERIZATION Right 12/15/2014   Procedure: Lower Extremity Angiography;  Surgeon: Algernon Huxley, MD;  Location: [place] [facility];  Service: Cardiovascular;  Laterality: Right;  . PERIPHERAL VASCULAR CATHETERIZATION  12/15/2014   Procedure: Lower Extremity Intervention;  Surgeon: Algernon Huxley, MD;  Location: [place] [facility];  Service: Cardiovascular;;  . PERIPHERAL VASCULAR CATHETERIZATION Right 08/14/2015   Procedure: A/V Shuntogram/Fistulagram;  Surgeon: Algernon Huxley, MD;  Location: [place] [facility];  Service: Cardiovascular;  Laterality: Right;  . PERIPHERAL VASCULAR CATHETERIZATION N/A 08/14/2015   Procedure: A/V Shunt Intervention;  Surgeon: Algernon Huxley, MD;  Location: [place] [facility];  Service: Cardiovascular;  Laterality: N/A;  . PERIPHERAL VASCULAR CATHETERIZATION N/A 01/11/2016    Procedure: Dialysis/Perma Catheter Insertion;  Surgeon: Algernon Huxley, MD;  Location: [place] [facility];  Service: Cardiovascular;  Laterality: N/A;  . REVISON OF ARTERIOVENOUS FISTULA Right 02/17/2016   Procedure: removal of AV fistula;  Surgeon: Serafina Mitchell, MD;  Location: [facility];  Service: Vascular;  Laterality: Right;  . REVISON OF ARTERIOVENOUS FISTULA Right 01/31/2016   Procedure: REVISON OF ARTERIOVENOUS FISTULA ( BRACHIOCEPHALIC ) W/ ARTEGRAFT;  Surgeon: Algernon Huxley, MD;  Location: [facility];  Service: Vascular;  Laterality: Right;  . TRANSMETATARSAL AMPUTATION Right 05/04/2015   Procedure: TRANSMETATARSAL AMPUTATION REVISION, great toe amputation;  Surgeon: Algernon Huxley, MD;  Location: [facility];  Service: Vascular;  Laterality: Right;   Social History        Tobacco Use  . Smoking status: Light Tobacco Smoker    Packs/day: 0.25    Years: 30.00    Pack years: 7.50    Types: Cigarettes  . Smokeless tobacco: Never Used  . Tobacco comment: 5  Substance Use Topics  . Alcohol use: No    Alcohol/week: 0.0 standard drinks  . Drug use: No    Comment: has used crack cocaine in past      Family History Family History  Problem Relation Age of Onset  . Leukemia Mother   . Heart attack Father   . Heart failure Other   . Hypertension Other   . Leukemia Other   . Diabetes Other   . Prostate cancer Neg Hx   . Kidney cancer Neg Hx   . Bladder Cancer Neg Hx           Allergies  Allergen Reactions  . Dust Mite Extract Other (See Comments)    Reaction: unknown     REVIEW OF SYSTEMS (Negative unless checked)  Constitutional: [] ?Weight loss  [] ?Fever  [] ?Chills Cardiac: [] ?Chest pain   [] ?Chest pressure   [] ?Palpitations   [] ?Shortness of breath when laying flat   [x] ?Shortness of breath at rest   [x] ?Shortness of breath with exertion. Vascular:  [] ?Pain in legs with walking   [] ?Pain in legs at rest   [] ?Pain in legs when laying flat    [] ?Claudication   [] ?Pain in feet when walking  [] ?  Pain in feet at rest  [] ?Pain in feet when laying flat   [] ?History of DVT   [] ?Phlebitis   [] ?Swelling in legs   [] ?Varicose veins   [] ?Non-healing ulcers Pulmonary:   [] ?Uses home oxygen   [] ?Productive cough   [] ?Hemoptysis   [] ?Wheeze  [x] ?COPD   [] ?Asthma Neurologic:  [] ?Dizziness  [] ?Blackouts   [] ?Seizures   [] ?History of stroke   [] ?History of TIA  [] ?Aphasia   [] ?Temporary blindness   [] ?Dysphagia   [] ?Weakness or numbness in arms   [] ?Weakness or numbness in legs Musculoskeletal:  [x] ?Arthritis   [] ?Joint swelling   [] ?Joint pain   [] ?Low back pain Hematologic:  [] ?Easy bruising  [] ?Easy bleeding   [] ?Hypercoagulable state   [x] ?Anemic   Gastrointestinal:  [x] ?Blood in stool   [] ?Vomiting blood  [x] ?Gastroesophageal reflux/heartburn   [] ?Abdominal pain Genitourinary:  [x] ?Chronic kidney disease   [] ?Difficult urination  [] ?Frequent urination  [] ?Burning with urination   [] ?Hematuria Skin:  [] ?Rashes   [] ?Ulcers   [] ?Wounds Psychological:  [] ?History of anxiety   [] ? History of major depression.    Physical Examination  BP 130/70 (BP Location: Right Arm)   Pulse 72   Resp 16  Gen:  WD/WN, NAD Head: [place]/AT, No temporalis wasting. Ear/Nose/Throat: Hearing grossly intact, nares w/o erythema or drainage Eyes: Conjunctiva clear. Sclera non-icteric Neck: Supple.  Trachea midline Pulmonary:  Good air movement, no use of accessory muscles.  Cardiac: RRR, no JVD Vascular: left arm AVF with soft thrill and good pulse Vessel Right Left  Radial Palpable Palpable                          PT 1+ Palpable Not Palpable  DP 1+ Palpable Not Palpable    Musculoskeletal: M/S 5/5 throughout.  No deformity or atrophy. Trace RLE edema. Neurologic: Sensation grossly intact in extremities.  Symmetrical.  Speech is fluent.  Psychiatric: Judgment intact, Mood & affect appropriate for pt's clinical situation. Dermatologic: No rashes or ulcers  noted.  No cellulitis or open wounds.       Labs Recent Results (from the past 2160 hour(s))  Troponin I     Status: Abnormal   Collection Time: 08/04/18  1:05 PM  Result Value Ref Range   Troponin I 0.22 (HH) <0.03 ng/mL    Comment: CRITICAL RESULT CALLED TO, READ BACK BY AND VERIFIED WITH AMANDA ALLRED Otila Kluver RN) 08/04/2018 @1402  RDW/SDR Performed at [facility], [address] [place], [place] [address]   CK Total (and CKMB)     Status: Abnormal   Collection Time: 08/04/18  1:05 PM  Result Value Ref Range   Total CK 145 49 - 397 U/L   CK, MB 5.2 (H) 0.5 - 5.0 ng/mL   Relative Index 3.6 (H) 0.0 - 2.5    Comment: Performed at [facility], [address] [place], [place] [address]  Basic metabolic panel     Status: Abnormal   Collection Time: 08/04/18  1:05 PM  Result Value Ref Range   Sodium 141 135 - 145 mmol/L   Potassium 3.7 3.5 - 5.1 mmol/L   Chloride 100 98 - 111 mmol/L   CO2 29 22 - 32 mmol/L   Glucose, Bld 98 70 - 99 mg/dL   BUN 56 (H) 8 - 23 mg/dL   Creatinine, Ser 8.38 (H) 0.61 - 1.24 mg/dL   Calcium 9.6 8.9 - 10.3 mg/dL   GFR calc non Af Amer 6 (L) >60 mL/min  GFR calc Af Amer 7 (L) >60 mL/min   Anion gap 12 5 - 15    Comment: Performed at [facility], [address] [place], [place] [address]  B Nat Peptide     Status: Abnormal   Collection Time: 08/04/18  1:05 PM  Result Value Ref Range   B Natriuretic Peptide 629.0 (H) 0.0 - 100.0 pg/mL    Comment: Performed at [facility], [address] [place], [place] [address]  Comprehensive metabolic panel     Status: Abnormal   Collection Time: 08/10/18  2:43 AM  Result Value Ref Range   Sodium 138 135 - 145 mmol/L   Potassium 4.1 3.5 - 5.1 mmol/L   Chloride 103 98 - 111 mmol/L   CO2 21 (L) 22 - 32 mmol/L   Glucose, Bld 131 (H) 70 - 99 mg/dL   BUN 67 (H) 8 - 23 mg/dL   Creatinine, Ser 10.25 (H) 0.61 - 1.24 mg/dL   Calcium 9.1 8.9 - 10.3 mg/dL   Total Protein  6.6 6.5 - 8.1 g/dL   Albumin 3.0 (L) 3.5 - 5.0 g/dL   AST 35 15 - 41 U/L   ALT 29 0 - 44 U/L   Alkaline Phosphatase 82 38 - 126 U/L   Total Bilirubin 0.3 0.3 - 1.2 mg/dL   GFR calc non Af Amer 5 (L) >60 mL/min   GFR calc Af Amer 6 (L) >60 mL/min   Anion gap 14 5 - 15    Comment: Performed at [facility], [address] [place], [place] [address]  Lipase, blood     Status: Abnormal   Collection Time: 08/10/18  2:43 AM  Result Value Ref Range   Lipase 86 (H) 11 - 51 U/L    Comment: Performed at [facility], [address] [place], [place] [address]  CBC with Differential     Status: Abnormal   Collection Time: 08/10/18  2:43 AM  Result Value Ref Range   WBC 8.9 4.0 - 10.5 K/uL   RBC 2.41 (L) 4.22 - 5.81 MIL/uL   Hemoglobin 7.6 (L) 13.0 - 17.0 g/dL   HCT 24.2 (L) 39.0 - 52.0 %   MCV 100.4 (H) 80.0 - 100.0 fL   MCH 31.5 26.0 - 34.0 pg   MCHC 31.4 30.0 - 36.0 g/dL   RDW 15.6 (H) 11.5 - 15.5 %   Platelets 209 150 - 400 K/uL   nRBC 0.5 (H) 0.0 - 0.2 %   Neutrophils Relative % 62 %   Neutro Abs 5.5 1.7 - 7.7 K/uL   Lymphocytes Relative 22 %   Lymphs Abs 2.0 0.7 - 4.0 K/uL   Monocytes Relative 12 %   Monocytes Absolute 1.1 (H) 0.1 - 1.0 K/uL   Eosinophils Relative 3 %   Eosinophils Absolute 0.2 0.0 - 0.5 K/uL   Basophils Relative 0 %   Basophils Absolute 0.0 0.0 - 0.1 K/uL   Immature Granulocytes 1 %   Abs Immature Granulocytes 0.05 0.00 - 0.07 K/uL    Comment: Performed at [facility], [address] [place], [place] [address]  Troponin I - Once     Status: Abnormal   Collection Time: 08/10/18  2:43 AM  Result Value Ref Range   Troponin I 1.89 (HH) <0.03 ng/mL    Comment: CRITICAL RESULT CALLED TO, READ BACK BY AND VERIFIED WITH REBECCA LYNN AT 2952 08/10/2018.  TFK Performed at [facility], [address],  [place], [place] [address]   Phosphorus     Status: Abnormal   Collection Time: 08/10/18  2:43 AM  Result Value Ref Range    Phosphorus 5.2 (H) 2.5 - 4.6 mg/dL    Comment: Performed at [facility], [address] [place], [place] [address]  Prepare RBC     Status: None   Collection Time: 08/10/18  4:15 AM  Result Value Ref Range   Order Confirmation      ORDER PROCESSED BY BLOOD BANK Performed at [facility], [address] [place], [place] [address]   Type and screen [place]     Status: None   Collection Time: 08/10/18  4:15 AM  Result Value Ref Range   ABO/RH(D) B POS    Antibody Screen NEG    Sample Expiration 08/13/2018    Unit Number [id number]    Blood Component Type RED CELLS,LR    Unit division 00    Status of Unit DISCARDED    Transfusion Status OK TO TRANSFUSE    Crossmatch Result Compatible    Unit Number [id number]    Blood Component Type RED CELLS,LR    Unit division 00    Status of Unit ISSUED,FINAL    Transfusion Status OK TO TRANSFUSE    Crossmatch Result Compatible    Unit Number [id number]    Blood Component Type RED CELLS,LR    Unit division 00    Status of Unit ISSUED,FINAL    Transfusion Status OK TO TRANSFUSE    Crossmatch Result      Compatible Performed at [facility], [address] [place], [place] [address]   APTT     Status: None   Collection Time: 08/10/18  4:15 AM  Result Value Ref Range   aPTT 36 24 - 36 seconds    Comment: Performed at [facility], [address] [place], [place] [address]  Protime-INR     Status: Abnormal   Collection Time: 08/10/18  4:15 AM  Result Value Ref Range   Prothrombin Time 15.9 (H) 11.4 - 15.2 seconds   INR 1.3 (H) 0.8 - 1.2    Comment: (NOTE) INR goal varies based on device and disease states. Performed at [facility], [place], [place] [address]   Heparin level (unfractionated)     Status: Abnormal   Collection Time: 08/10/18  4:15 AM  Result Value Ref Range   Heparin Unfractionated >3.60 (H) 0.30 - 0.70 IU/mL     Comment: RESULTS CONFIRMED BY MANUAL DILUTION (NOTE) If heparin results are below expected values, and patient dosage has  been confirmed, suggest follow up testing of antithrombin III levels. Performed at [facility], [address] [place], [place] [address]   BPAM RBC     Status: None   Collection Time: 08/10/18  4:15 AM  Result Value Ref Range   ISSUE DATE / TIME 811572620355    Blood Product Unit Number [id number]    PRODUCT CODE O0321Y24    Unit Type and Rh 7300    Blood Product Expiration Date 825003704888    ISSUE DATE / TIME 916945038882    Blood Product Unit Number [id number]    PRODUCT CODE W9794I01    Unit Type and Rh 7300    Blood Product Expiration Date 655374827078    ISSUE DATE / TIME 675449201007    Blood Product Unit Number [id number]    PRODUCT CODE I2641R83    Unit Type and  Rh 7300    Blood Product Expiration Date 093818299371   Glucose, capillary     Status: Abnormal   Collection Time: 08/10/18  6:44 AM  Result Value Ref Range   Glucose-Capillary 104 (H) 70 - 99 mg/dL  MRSA PCR Screening     Status: Abnormal   Collection Time: 08/10/18  6:54 AM  Result Value Ref Range   MRSA by PCR POSITIVE (A) NEGATIVE    Comment:        The GeneXpert MRSA Assay (FDA approved for NASAL specimens only), is one component of a comprehensive MRSA colonization surveillance program. It is not intended to diagnose MRSA infection nor to guide or monitor treatment for MRSA infections. RESULT CALLED TO, READ BACK BY AND VERIFIED WITH:  [facility] AT 6967 08/10/18 KM/SDR Performed at [facility], [address] [place], [place] [address]   APTT     Status: Abnormal   Collection Time: 08/10/18  1:40 PM  Result Value Ref Range   aPTT 102 (H) 24 - 36 seconds    Comment:        IF BASELINE aPTT IS ELEVATED, SUGGEST PATIENT RISK ASSESSMENT BE USED TO DETERMINE APPROPRIATE ANTICOAGULANT THERAPY. Performed at [facility], [name] [place], [place] [address]   ECHOCARDIOGRAM COMPLETE     Status: None   Collection Time: 08/10/18  2:52 PM  Result Value Ref Range   Weight 3,354.52 oz   Height 68 in   BP 98/72 mmHg  APTT     Status: Abnormal   Collection Time: 08/10/18  6:02 PM  Result Value Ref Range   aPTT 98 (H) 24 - 36 seconds    Comment:        IF BASELINE aPTT IS ELEVATED, SUGGEST PATIENT RISK ASSESSMENT BE USED TO DETERMINE APPROPRIATE ANTICOAGULANT THERAPY. Performed at [facility], [address] [place], [place] [address]   Basic metabolic panel     Status: Abnormal   Collection Time: 08/11/18  4:53 AM  Result Value Ref Range   Sodium 140 135 - 145 mmol/L   Potassium 4.5 3.5 - 5.1 mmol/L   Chloride 103 98 - 111 mmol/L   CO2 27 22 - 32 mmol/L   Glucose, Bld 88 70 - 99 mg/dL   BUN 39 (H) 8 - 23 mg/dL   Creatinine, Ser 7.30 (H) 0.61 - 1.24 mg/dL   Calcium 8.9 8.9 - 10.3 mg/dL   GFR calc non Af Amer 7 (L) >60 mL/min   GFR calc Af Amer 8 (L) >60 mL/min   Anion gap 10 5 - 15    Comment: Performed at [facility], [address] [place], [place] [address]  Lipid panel     Status: Abnormal   Collection Time: 08/11/18  4:53 AM  Result Value Ref Range   Cholesterol 97 0 - 200 mg/dL   Triglycerides 150 (H) <150 mg/dL   HDL 32 (L) >40 mg/dL   Total CHOL/HDL Ratio 3.0 RATIO   VLDL 30 0 - 40 mg/dL   LDL Cholesterol 35 0 - 99 mg/dL    Comment:        Total Cholesterol/HDL:CHD Risk Coronary Heart Disease Risk Table                     Men   Women  1/2 Average Risk   3.4   3.3  Average Risk       5.0   4.4  2  X Average Risk   9.6   7.1  3 X Average Risk  23.4   11.0        Use the calculated Patient Ratio above and the CHD Risk Table to determine the patient's CHD Risk.        ATP III CLASSIFICATION (LDL):  <100     mg/dL   Optimal  100-129  mg/dL   Near or Above                    Optimal  130-159  mg/dL   Borderline  160-189  mg/dL   High  >190     mg/dL    Very High Performed at [facility], [address] [place], [place] [address]   CBC     Status: Abnormal   Collection Time: 08/11/18  4:53 AM  Result Value Ref Range   WBC 10.7 (H) 4.0 - 10.5 K/uL   RBC 3.04 (L) 4.22 - 5.81 MIL/uL   Hemoglobin 9.3 (L) 13.0 - 17.0 g/dL   HCT 29.6 (L) 39.0 - 52.0 %   MCV 97.4 80.0 - 100.0 fL   MCH 30.6 26.0 - 34.0 pg   MCHC 31.4 30.0 - 36.0 g/dL   RDW 17.1 (H) 11.5 - 15.5 %   Platelets 205 150 - 400 K/uL   nRBC 0.3 (H) 0.0 - 0.2 %    Comment: Performed at [facility], [address] [place], [place] [address]  Protime-INR     Status: None   Collection Time: 08/11/18  4:53 AM  Result Value Ref Range   Prothrombin Time 14.9 11.4 - 15.2 seconds   INR 1.2 0.8 - 1.2    Comment: (NOTE) INR goal varies based on device and disease states. Performed at [facility], [address] [place], [place] [address]   Heparin level (unfractionated)     Status: Abnormal   Collection Time: 08/11/18  4:53 AM  Result Value Ref Range   Heparin Unfractionated 3.16 (H) 0.30 - 0.70 IU/mL    Comment: RESULTS CONFIRMED BY MANUAL DILUTION Performed at [facility], [address] [place], [place] [address]   APTT     Status: Abnormal   Collection Time: 08/11/18  4:53 AM  Result Value Ref Range   aPTT 114 (H) 24 - 36 seconds    Comment:        IF BASELINE aPTT IS ELEVATED, SUGGEST PATIENT RISK ASSESSMENT BE USED TO DETERMINE APPROPRIATE ANTICOAGULANT THERAPY. Performed at [facility], [address] [place], [place] [address]   Troponin I - ONCE - STAT     Status: Abnormal   Collection Time: 08/11/18  6:24 AM  Result Value Ref Range   Troponin I 7.34 (HH) <0.03 ng/mL    Comment: CRITICAL VALUE NOTED. VALUE IS CONSISTENT WITH PREVIOUSLY REPORTED/CALLED VALUE JJB Performed at [facility], [address] [place], [place] [address]   APTT     Status: Abnormal   Collection Time: 08/11/18  3:34 PM   Result Value Ref Range   aPTT 40 (H) 24 - 36 seconds    Comment:        IF BASELINE aPTT IS ELEVATED, SUGGEST PATIENT RISK ASSESSMENT BE USED TO DETERMINE APPROPRIATE ANTICOAGULANT THERAPY. Performed at [facility], [address] [place], [place] [address]   APTT     Status: Abnormal   Collection Time: 08/11/18  8:19 PM  Result Value Ref Range  aPTT 60 (H) 24 - 36 seconds    Comment:        IF BASELINE aPTT IS ELEVATED, SUGGEST PATIENT RISK ASSESSMENT BE USED TO DETERMINE APPROPRIATE ANTICOAGULANT THERAPY. Performed at [facility], [address] [place], [place] [address]   CBC     Status: Abnormal   Collection Time: 08/12/18  6:13 AM  Result Value Ref Range   WBC 8.9 4.0 - 10.5 K/uL   RBC 3.18 (L) 4.22 - 5.81 MIL/uL   Hemoglobin 9.6 (L) 13.0 - 17.0 g/dL   HCT 31.4 (L) 39.0 - 52.0 %   MCV 98.7 80.0 - 100.0 fL   MCH 30.2 26.0 - 34.0 pg   MCHC 30.6 30.0 - 36.0 g/dL   RDW 16.6 (H) 11.5 - 15.5 %   Platelets 189 150 - 400 K/uL   nRBC 0.3 (H) 0.0 - 0.2 %    Comment: Performed at [facility], [address] [place], [place] [address]  Renal function panel     Status: Abnormal   Collection Time: 08/12/18  6:13 AM  Result Value Ref Range   Sodium 139 135 - 145 mmol/L   Potassium 5.1 3.5 - 5.1 mmol/L   Chloride 100 98 - 111 mmol/L   CO2 27 22 - 32 mmol/L   Glucose, Bld 110 (H) 70 - 99 mg/dL   BUN 57 (H) 8 - 23 mg/dL   Creatinine, Ser 8.81 (H) 0.61 - 1.24 mg/dL   Calcium 9.1 8.9 - 10.3 mg/dL   Phosphorus 5.6 (H) 2.5 - 4.6 mg/dL   Albumin 3.0 (L) 3.5 - 5.0 g/dL   GFR calc non Af Amer 6 (L) >60 mL/min   GFR calc Af Amer 7 (L) >60 mL/min   Anion gap 12 5 - 15    Comment: Performed at [facility], [place], [place] [address]  Heparin level (unfractionated)     Status: Abnormal   Collection Time: 08/12/18  6:13 AM  Result Value Ref Range   Heparin Unfractionated 2.20 (H) 0.30 - 0.70 IU/mL    Comment: RESULTS  CONFIRMED BY MANUAL DILUTION (NOTE) If heparin results are below expected values, and patient dosage has  been confirmed, suggest follow up testing of antithrombin III levels. Performed at [facility], [address] [place], [place] [address]   APTT     Status: Abnormal   Collection Time: 08/12/18  6:13 AM  Result Value Ref Range   aPTT 74 (H) 24 - 36 seconds    Comment:        IF BASELINE aPTT IS ELEVATED, SUGGEST PATIENT RISK ASSESSMENT BE USED TO DETERMINE APPROPRIATE ANTICOAGULANT THERAPY. Performed at [facility], [address] [place], [place] [address]   APTT     Status: Abnormal   Collection Time: 08/12/18  2:17 PM  Result Value Ref Range   aPTT 63 (H) 24 - 36 seconds    Comment:        IF BASELINE aPTT IS ELEVATED, SUGGEST PATIENT RISK ASSESSMENT BE USED TO DETERMINE APPROPRIATE ANTICOAGULANT THERAPY. Performed at [facility], [address] [place], [place] [address]   Phosphorus     Status: None   Collection Time: 08/12/18  2:17 PM  Result Value Ref Range   Phosphorus 3.5 2.5 - 4.6 mg/dL    Comment: Performed at [facility], [address] [place], [place] [address]  Parathyroid hormone, intact (no Ca)     Status: Abnormal  Collection Time: 08/12/18  2:17 PM  Result Value Ref Range   PTH 246 (H) 15 - 65 pg/mL    Comment: (NOTE) Performed At: [facility] [place], [place] [id number] Rush Farmer MD [id number]   Glucose, capillary     Status: None   Collection Time: 08/12/18  7:36 PM  Result Value Ref Range   Glucose-Capillary 87 70 - 99 mg/dL  Renal function panel     Status: Abnormal   Collection Time: 08/12/18 10:49 PM  Result Value Ref Range   Sodium 138 135 - 145 mmol/L   Potassium 4.0 3.5 - 5.1 mmol/L   Chloride 99 98 - 111 mmol/L   CO2 30 22 - 32 mmol/L   Glucose, Bld 115 (H) 70 - 99 mg/dL   BUN 36 (H) 8 - 23 mg/dL   Creatinine, Ser 6.74 (H) 0.61 - 1.24 mg/dL    Calcium 8.7 (L) 8.9 - 10.3 mg/dL   Phosphorus 4.5 2.5 - 4.6 mg/dL   Albumin 2.7 (L) 3.5 - 5.0 g/dL   GFR calc non Af Amer 8 (L) >60 mL/min   GFR calc Af Amer 9 (L) >60 mL/min   Anion gap 9 5 - 15    Comment: Performed at [facility], [address] [place], [place] [address]  APTT     Status: Abnormal   Collection Time: 08/12/18 10:49 PM  Result Value Ref Range   aPTT 69 (H) 24 - 36 seconds    Comment:        IF BASELINE aPTT IS ELEVATED, SUGGEST PATIENT RISK ASSESSMENT BE USED TO DETERMINE APPROPRIATE ANTICOAGULANT THERAPY. Performed at [facility], [address] [place], [place] [address]   CBC     Status: Abnormal   Collection Time: 08/13/18  4:12 AM  Result Value Ref Range   WBC 8.2 4.0 - 10.5 K/uL   RBC 3.05 (L) 4.22 - 5.81 MIL/uL   Hemoglobin 9.3 (L) 13.0 - 17.0 g/dL   HCT 29.8 (L) 39.0 - 52.0 %   MCV 97.7 80.0 - 100.0 fL   MCH 30.5 26.0 - 34.0 pg   MCHC 31.2 30.0 - 36.0 g/dL   RDW 16.2 (H) 11.5 - 15.5 %   Platelets 185 150 - 400 K/uL   nRBC 0.0 0.0 - 0.2 %    Comment: Performed at [facility], [address] [place], [place] [address]  Heparin level (unfractionated)     Status: Abnormal   Collection Time: 08/13/18  4:12 AM  Result Value Ref Range   Heparin Unfractionated 1.27 (H) 0.30 - 0.70 IU/mL    Comment: (NOTE) If heparin results are below expected values, and patient dosage has  been confirmed, suggest follow up testing of antithrombin III levels. Performed at [facility], [address] [place], [place] [address]   NM Myocar Multi W/Spect Tamela Oddi Motion / EF     Status: None   Collection Time: 08/13/18  9:49 AM  Result Value Ref Range   SSS 9    SRS 7    SDS 7    TID 1.79    LV sys vol 255 mL   LV dias vol 319 62 - 150 mL   Rest HR 79 bpm   Rest BP 98/73 mmHg   Exercise duration (sec) 0 sec   Percent HR 61 %   Exercise duration (min) 1 min   Estimated workload 1.0 METS   Peak HR 96 bpm  Peak BP  106/67 mmHg   MPHR 156 bpm  APTT     Status: Abnormal   Collection Time: 08/13/18 12:13 PM  Result Value Ref Range   aPTT 60 (H) 24 - 36 seconds    Comment:        IF BASELINE aPTT IS ELEVATED, SUGGEST PATIENT RISK ASSESSMENT BE USED TO DETERMINE APPROPRIATE ANTICOAGULANT THERAPY. Performed at [facility], [address] [place], [place] [address]   Troponin I - Once-Timed     Status: Abnormal   Collection Time: 08/13/18 12:13 PM  Result Value Ref Range   Troponin I 7.08 (HH) <0.03 ng/mL    Comment: CRITICAL VALUE NOTED. VALUE IS CONSISTENT WITH PREVIOUSLY REPORTED/CALLED VALUE RDW/KLW Performed at [facility], [address] [place], [place] [address]   APTT     Status: Abnormal   Collection Time: 08/13/18  5:51 PM  Result Value Ref Range   aPTT 68 (H) 24 - 36 seconds    Comment:        IF BASELINE aPTT IS ELEVATED, SUGGEST PATIENT RISK ASSESSMENT BE USED TO DETERMINE APPROPRIATE ANTICOAGULANT THERAPY. Performed at [facility], [address] [place], [place] [address]   Troponin I - Now Then Q6H     Status: Abnormal   Collection Time: 08/13/18  6:55 PM  Result Value Ref Range   Troponin I 6.33 (HH) <0.03 ng/mL    Comment: CRITICAL VALUE NOTED. VALUE IS CONSISTENT WITH PREVIOUSLY REPORTED/CALLED VALUE/JUW Performed at [facility], [address] [place], [place] [address]   Troponin I - Now Then Q6H     Status: Abnormal   Collection Time: 08/14/18  7:46 PM  Result Value Ref Range   Troponin I 5.29 (HH) <0.03 ng/mL    Comment: CRITICAL VALUE NOTED. VALUE IS CONSISTENT WITH PREVIOUSLY REPORTED/CALLED VALUE / [facility] Performed at [facility], [address] [place], [place] [address]   APTT     Status: Abnormal   Collection Time: 08/14/18  7:46 PM  Result Value Ref Range   aPTT 81 (H) 24 - 36 seconds    Comment:        IF BASELINE aPTT IS ELEVATED, SUGGEST PATIENT RISK ASSESSMENT BE USED TO DETERMINE APPROPRIATE  ANTICOAGULANT THERAPY. Performed at [facility], [place], [place] [address]   Heparin level (unfractionated)     Status: Abnormal   Collection Time: 08/14/18  7:46 PM  Result Value Ref Range   Heparin Unfractionated 1.10 (H) 0.30 - 0.70 IU/mL    Comment: (NOTE) If heparin results are below expected values, and patient dosage has  been confirmed, suggest follow up testing of antithrombin III levels. Performed at [facility], [address] [place], [place] [address]   CBC     Status: Abnormal   Collection Time: 08/14/18  7:46 PM  Result Value Ref Range   WBC 8.9 4.0 - 10.5 K/uL   RBC 2.95 (L) 4.22 - 5.81 MIL/uL   Hemoglobin 8.9 (L) 13.0 - 17.0 g/dL   HCT 28.9 (L) 39.0 - 52.0 %   MCV 98.0 80.0 - 100.0 fL   MCH 30.2 26.0 - 34.0 pg   MCHC 30.8 30.0 - 36.0 g/dL   RDW 15.6 (H) 11.5 - 15.5 %   Platelets 215 150 - 400 K/uL   nRBC 0.0 0.0 - 0.2 %    Comment: Performed at [facility], [address], [place], [place] [address]  Renal function  panel     Status: Abnormal   Collection Time: 08/14/18  7:46 PM  Result Value Ref Range   Sodium 137 135 - 145 mmol/L   Potassium 4.5 3.5 - 5.1 mmol/L   Chloride 98 98 - 111 mmol/L   CO2 25 22 - 32 mmol/L   Glucose, Bld 92 70 - 99 mg/dL   BUN 65 (H) 8 - 23 mg/dL   Creatinine, Ser 10.78 (H) 0.61 - 1.24 mg/dL   Calcium 9.0 8.9 - 10.3 mg/dL   Phosphorus 6.2 (H) 2.5 - 4.6 mg/dL   Albumin 2.9 (L) 3.5 - 5.0 g/dL   GFR calc non Af Amer 4 (L) >60 mL/min   GFR calc Af Amer 5 (L) >60 mL/min   Anion gap 14 5 - 15    Comment: Performed at [facility], [address] [place], [place] [address]  Troponin I - Now Then Q6H     Status: Abnormal   Collection Time: 08/15/18  4:06 AM  Result Value Ref Range   Troponin I 4.57 (HH) <0.03 ng/mL    Comment: CRITICAL VALUE NOTED. VALUE IS CONSISTENT WITH PREVIOUSLY REPORTED/CALLED VALUE.  TFK Performed at [facility], [address]  [place], [place] [address]   CBC     Status: Abnormal   Collection Time: 08/15/18  4:06 AM  Result Value Ref Range   WBC 9.0 4.0 - 10.5 K/uL   RBC 2.94 (L) 4.22 - 5.81 MIL/uL   Hemoglobin 8.9 (L) 13.0 - 17.0 g/dL   HCT 28.5 (L) 39.0 - 52.0 %   MCV 96.9 80.0 - 100.0 fL   MCH 30.3 26.0 - 34.0 pg   MCHC 31.2 30.0 - 36.0 g/dL   RDW 15.5 11.5 - 15.5 %   Platelets 203 150 - 400 K/uL   nRBC 0.0 0.0 - 0.2 %    Comment: Performed at [facility], [address] [place], [place] [address]  Phosphorus     Status: None   Collection Time: 08/15/18  4:06 AM  Result Value Ref Range   Phosphorus 4.0 2.5 - 4.6 mg/dL    Comment: Performed at [facility], [address] [place], [place] [address]  Magnesium     Status: None   Collection Time: 08/15/18  4:06 AM  Result Value Ref Range   Magnesium 2.1 1.7 - 2.4 mg/dL    Comment: Performed at [facility], [address] [place], [place] [address]  Basic metabolic panel     Status: Abnormal   Collection Time: 08/15/18  4:06 AM  Result Value Ref Range   Sodium 138 135 - 145 mmol/L   Potassium 3.7 3.5 - 5.1 mmol/L   Chloride 98 98 - 111 mmol/L   CO2 27 22 - 32 mmol/L   Glucose, Bld 77 70 - 99 mg/dL   BUN 36 (H) 8 - 23 mg/dL   Creatinine, Ser 7.03 (H) 0.61 - 1.24 mg/dL   Calcium 8.8 (L) 8.9 - 10.3 mg/dL   GFR calc non Af Amer 7 (L) >60 mL/min   GFR calc Af Amer 9 (L) >60 mL/min   Anion gap 13 5 - 15    Comment: Performed at [facility], [address] [place], [place] [address]  Troponin I - Once     Status: Abnormal   Collection Time: 08/15/18  9:54 AM  Result Value Ref Range   Troponin I 4.21 (HH) <0.03 ng/mL    Comment: CRITICAL VALUE  NOTED. VALUE IS CONSISTENT WITH PREVIOUSLY REPORTED/CALLED VALUE AKT Performed at [facility], [place], [place] [address]   Heparin level (unfractionated)     Status: Abnormal   Collection Time: 08/15/18  7:29 PM  Result Value Ref Range    Heparin Unfractionated 0.77 (H) 0.30 - 0.70 IU/mL    Comment: (NOTE) If heparin results are below expected values, and patient dosage has  been confirmed, suggest follow up testing of antithrombin III levels. Performed at [facility], [address] [place], [place] [address]   APTT     Status: Abnormal   Collection Time: 08/15/18  7:29 PM  Result Value Ref Range   aPTT 89 (H) 24 - 36 seconds    Comment:        IF BASELINE aPTT IS ELEVATED, SUGGEST PATIENT RISK ASSESSMENT BE USED TO DETERMINE APPROPRIATE ANTICOAGULANT THERAPY. Performed at [facility], [address] [place], [place] [address]   CBC     Status: Abnormal   Collection Time: 08/16/18  4:13 AM  Result Value Ref Range   WBC 8.3 4.0 - 10.5 K/uL   RBC 2.90 (L) 4.22 - 5.81 MIL/uL   Hemoglobin 8.9 (L) 13.0 - 17.0 g/dL   HCT 27.7 (L) 39.0 - 52.0 %   MCV 95.5 80.0 - 100.0 fL   MCH 30.7 26.0 - 34.0 pg   MCHC 32.1 30.0 - 36.0 g/dL   RDW 15.0 11.5 - 15.5 %   Platelets 184 150 - 400 K/uL   nRBC 0.0 0.0 - 0.2 %    Comment: Performed at [facility], [address] [place], [place] [address]  Basic metabolic panel     Status: Abnormal   Collection Time: 08/16/18  4:13 AM  Result Value Ref Range   Sodium 138 135 - 145 mmol/L   Potassium 3.8 3.5 - 5.1 mmol/L   Chloride 101 98 - 111 mmol/L   CO2 26 22 - 32 mmol/L   Glucose, Bld 122 (H) 70 - 99 mg/dL   BUN 50 (H) 8 - 23 mg/dL   Creatinine, Ser 9.31 (H) 0.61 - 1.24 mg/dL   Calcium 9.3 8.9 - 10.3 mg/dL   GFR calc non Af Amer 5 (L) >60 mL/min   GFR calc Af Amer 6 (L) >60 mL/min   Anion gap 11 5 - 15    Comment: Performed at [facility], [address] [place], [place] [address]  Magnesium     Status: None   Collection Time: 08/16/18  4:13 AM  Result Value Ref Range   Magnesium 2.4 1.7 - 2.4 mg/dL    Comment: Performed at [facility], [address] [place], [place] [address]  Phosphorus     Status: Abnormal    Collection Time: 08/16/18  4:13 AM  Result Value Ref Range   Phosphorus 5.1 (H) 2.5 - 4.6 mg/dL    Comment: Performed at [facility], [place], [place] [address]  Heparin level (unfractionated)     Status: Abnormal   Collection Time: 08/16/18  4:13 AM  Result Value Ref Range   Heparin Unfractionated 0.83 (H) 0.30 - 0.70 IU/mL    Comment: (NOTE) If heparin results are below expected values, and patient dosage has  been confirmed, suggest follow up testing of antithrombin III levels. Performed at [facility], [address] [place], [place] [address]   APTT     Status: Abnormal   Collection Time:  08/16/18  4:13 AM  Result Value Ref Range   aPTT 107 (H) 24 - 36 seconds    Comment:        IF BASELINE aPTT IS ELEVATED, SUGGEST PATIENT RISK ASSESSMENT BE USED TO DETERMINE APPROPRIATE ANTICOAGULANT THERAPY. Performed at [facility], [place], [place] [address]   Heparin level (unfractionated)     Status: None   Collection Time: 08/16/18  1:17 PM  Result Value Ref Range   Heparin Unfractionated 0.68 0.30 - 0.70 IU/mL    Comment: (NOTE) If heparin results are below expected values, and patient dosage has  been confirmed, suggest follow up testing of antithrombin III levels. Performed at [facility], [address] [place], [place] [address]   APTT     Status: Abnormal   Collection Time: 08/16/18  1:17 PM  Result Value Ref Range   aPTT 81 (H) 24 - 36 seconds    Comment:        IF BASELINE aPTT IS ELEVATED, SUGGEST PATIENT RISK ASSESSMENT BE USED TO DETERMINE APPROPRIATE ANTICOAGULANT THERAPY. Performed at [facility], [place], [place] [address]   Heparin level (unfractionated)     Status: None   Collection Time: 08/16/18 10:01 PM  Result Value Ref Range   Heparin Unfractionated 0.48 0.30 - 0.70 IU/mL    Comment: (NOTE) If heparin results are below expected values, and  patient dosage has  been confirmed, suggest follow up testing of antithrombin III levels. Performed at [facility], [address] [place], [place] [address]   CBC     Status: Abnormal   Collection Time: 08/17/18  4:33 AM  Result Value Ref Range   WBC 9.0 4.0 - 10.5 K/uL   RBC 2.83 (L) 4.22 - 5.81 MIL/uL   Hemoglobin 8.6 (L) 13.0 - 17.0 g/dL   HCT 27.3 (L) 39.0 - 52.0 %   MCV 96.5 80.0 - 100.0 fL   MCH 30.4 26.0 - 34.0 pg   MCHC 31.5 30.0 - 36.0 g/dL   RDW 15.0 11.5 - 15.5 %   Platelets 203 150 - 400 K/uL   nRBC 0.2 0.0 - 0.2 %    Comment: Performed at [facility], [address] [name], [place] [address]  Heparin level (unfractionated)     Status: None   Collection Time: 08/17/18  5:58 AM  Result Value Ref Range   Heparin Unfractionated 0.43 0.30 - 0.70 IU/mL    Comment: (NOTE) If heparin results are below expected values, and patient dosage has  been confirmed, suggest follow up testing of antithrombin III levels. Performed at [facility], [address] [place], [place] [address]   CBC     Status: Abnormal   Collection Time: 08/18/18  3:34 AM  Result Value Ref Range   WBC 9.0 4.0 - 10.5 K/uL   RBC 2.97 (L) 4.22 - 5.81 MIL/uL   Hemoglobin 9.1 (L) 13.0 - 17.0 g/dL   HCT 28.6 (L) 39.0 - 52.0 %   MCV 96.3 80.0 - 100.0 fL   MCH 30.6 26.0 - 34.0 pg   MCHC 31.8 30.0 - 36.0 g/dL   RDW 15.2 11.5 - 15.5 %   Platelets 222 150 - 400 K/uL   nRBC 0.7 (H) 0.0 - 0.2 %    Comment: Performed at [facility], [address], [place], [place] [address]  CBC     Status: Abnormal   Collection Time: 09/20/18 12:16 AM  Result Value Ref Range   WBC 7.9 4.0 - 10.5 K/uL   RBC 3.97 (L) 4.22 - 5.81 MIL/uL   Hemoglobin 11.5 (L) 13.0 - 17.0 g/dL   HCT 37.2 (L) 39.0 - 52.0 %   MCV 93.7 80.0 - 100.0 fL   MCH 29.0 26.0 - 34.0 pg   MCHC 30.9 30.0 - 36.0 g/dL   RDW 16.9 (H) 11.5 - 15.5 %   Platelets 211 150 - 400 K/uL   nRBC 0.0 0.0 - 0.2 %     Comment: Performed at [facility], [place] [place], [place] [address]  Comprehensive metabolic panel     Status: Abnormal   Collection Time: 09/20/18 12:16 AM  Result Value Ref Range   Sodium 139 135 - 145 mmol/L   Potassium 3.7 3.5 - 5.1 mmol/L   Chloride 101 98 - 111 mmol/L   CO2 26 22 - 32 mmol/L   Glucose, Bld 157 (H) 70 - 99 mg/dL   BUN 43 (H) 8 - 23 mg/dL   Creatinine, Ser 8.68 (H) 0.61 - 1.24 mg/dL   Calcium 9.6 8.9 - 10.3 mg/dL   Total Protein 7.9 6.5 - 8.1 g/dL   Albumin 3.1 (L) 3.5 - 5.0 g/dL   AST 35 15 - 41 U/L   ALT 25 0 - 44 U/L   Alkaline Phosphatase 118 38 - 126 U/L   Total Bilirubin 0.6 0.3 - 1.2 mg/dL   GFR calc non Af Amer 6 (L) >60 mL/min   GFR calc Af Amer 7 (L) >60 mL/min   Anion gap 12 5 - 15    Comment: Performed at [facility], [address] [place], [place] [address]  Troponin I - ONCE - STAT     Status: Abnormal   Collection Time: 09/20/18 12:16 AM  Result Value Ref Range   Troponin I 0.12 (HH) <0.03 ng/mL    Comment: CRITICAL RESULT CALLED TO, READ BACK BY AND VERIFIED WITH ANN CALES AT 4259 09/20/2018.  TFK Performed at [facility], [address] [place], [place] [address]   Troponin I - Once-Timed     Status: Abnormal   Collection Time: 09/20/18  2:21 AM  Result Value Ref Range   Troponin I 0.11 (HH) <0.03 ng/mL    Comment: CRITICAL VALUE NOTED. VALUE IS CONSISTENT WITH PREVIOUSLY REPORTED/CALLED VALUE RDW Performed at [facility], [address] [place], [place] [address]     Radiology No results found.  Assessment/Plan Hyperlipidemia lipid control important in reducing the progression of atherosclerotic disease. Continue statin therapy   PVD (peripheral vascular disease) (HCC) S/p left BKA, right TMA. ABI [place] with pretty good flow distally today. No new symptoms Recheck at next visit  HYPERTENSION, BENIGN blood pressure control important in reducing the progression of  atherosclerotic disease. On appropriate oral medications.  End-stage renal disease on hemodialysis ([place]) Although the flow volumes on duplex today are on the low side, no obvious focal stenosis is seen.  Clinically, he is having no issue with the fistula and it is working well.  At this point, I think a check in 1 year would be appropriate unless worsening problems develop on dialysis.    Leotis Pain, MD  10/27/2018 3:31 PM    This note was created with Dragon medical transcription system.  Any errors from dictation are purely unintentional

## 2018-10-27 NOTE — Patient Instructions (Signed)
Vascular Access for Hemodialysis        A vascular access is a connection to the blood inside your blood vessels that allows blood to be easily removed from your body and returned to your body during kidney dialysis (hemodialysis). Hemodialysis is a procedure in which a machine outside of the body filters the blood of a person whose kidneys are no longer working properly. There are three types of vascular accesses:  Arteriovenous fistula (AVF). This is a connection between an artery and a vein (usually in the arm) that is made by sewing them together. Blood in the artery flows directly into the vein, causing it to get larger over time. This makes it easier for the vein to be used for hemodialysis. An arteriovenous fistula takes 1-6 months to develop after surgery.  Arteriovenous graft (AVG). This is a connection between an artery and a vein in the arm that is made with a tube. An arteriovenous graft can be used within 2-3 weeks of surgery.  Venous catheter. This is a thin, flexible tube that is placed in a large vein (usually in the neck, chest, or groin). A venous catheter for hemodialysis contains two tubes that come out of the skin. A venous catheter can be used right away. It is usually used as a temporary access if you need hemodialysis before a fistula or graft has developed, or if kidney failure is sudden (acute) and likely to improve without the need for long-term dialysis. It may also be used as a permanent access if a fistula or graft cannot be created. Which type of access is best for me? The type of access that is best for you depends on the size and strength of your veins, your age, and any other health problems that you have, such as diabetes. An ultrasound test may be used to look at your veins to help make this decision. A fistula is usually the preferred type of access. It can last several years and is less likely than the other types of accesses to become infected or to cause a blood  clot within a blood vessel (thrombosis). However, a fistula is not an option for everyone. If your veins are not the right size or if the fistula does not develop properly, a graft may be used instead. Grafts require you to have strong veins. If your veins are not strong enough for a graft, a catheter may be used. Catheters are more likely than fistulas and grafts to become infected or to have a thrombosis. Sometimes, only one type of access is an option. Your health care provider will help you determine which type of access is best for you. How is a vascular access used? The way that the access is used depends on the type of access:  If the access is a fistula or graft, two needles are inserted through the skin into the access before each hemodialysis session. Blood leaves the body through one of the needles and travels through a tube to the hemodialysis machine (dialyzer). Then it flows through another tube and returns to the body through the second needle.  If the access is a catheter, one tube is connected directly to the tube that leads to the dialyzer, and the other tube is connected to a tube that leads away from the dialyzer. Blood leaves the body through one tube and returns to the body through the other. What problems can occur with vascular access?  A blood clot within a blood vessel (thrombosis).   Thrombosis can lead to a narrowing of a blood vessel (stenosis). If thrombosis occurs frequently, another access site may be created as a backup.  Infection.  Heart enlargement (cardiomegaly) and heart failure. Changes in blood flow may cause an increase in blood pressure or heart rate, making your heart work harder to pump blood. These problems are most likely to occur with a venous catheter and least likely to occur with an arteriovenous fistula. How do I care for my vascular access?  Wear a medical alert bracelet. In case of an emergency, this bracelet tells health care providers that you  are a dialysis patient and allows them to care for your veins appropriately. If you have a graft or fistula:  A "bruit" is a noise that is heard with a stethoscope, and a "thrill" is a vibration that is felt over the graft or fistula. The presence of the bruit and thrill indicates that the access is working. You will be taught to feel for the thrill each day. If this is not felt, the access may be clotted. Call your health care provider.  Keep your arm straight and raised (elevated) above your heart while the access site is healing.  You may freely use the arm where your vascular access is located after the site heals. Keep the following in mind: ? Avoid pressure on the arm. ? Avoid lifting heavy objects with the arm. ? Avoid sleeping on the arm. ? Avoid wearing tight-sleeved shirts or jewelry around the graft or fistula.  Do not allow blood pressure monitoring or needle punctures on the side where the graft or fistula is located.  With permission from your health care provider, you may do exercises to help with blood flow through a fistula. These exercises involve squeezing a rubber ball or other soft objects as instructed.  Wash your access site according to directions from your health care provider. If you have a venous catheter:  Keep the insertion site clean and dry at all times.  If you are told you can shower after the site heals, use a protective covering over the catheter to keep it dry.  Follow directions from your health care provider for bandage (dressing) changes.  Ask your health care provider what activities are safe for you. You may be restricted from lifting or making repetitive arm movements on the side with the catheter. Contact a health care provider if:  Swelling around the graft or fistula gets worse.  You develop new pain.  Your catheter gets damaged. Get help right away if:  You have pain, numbness, an unusual pale skin color, or blue fingers or sores at  the tips of your fingers in the hand on the side of your fistula.  You have chills.  You have a fever.  You have pus or other fluid (drainage) at the vascular access site.  You develop skin redness or red streaking on the skin around, above, or below the vascular access.  You have bleeding at the vascular access that cannot be easily controlled.  Your catheter gets pulled out of place.  You feel your heart racing or skipping beats.  You have chest pain. Summary  A vascular access is a connection to the blood inside your blood vessels that allows blood to be easily removed from your body and returned to your body during kidney dialysis (hemodialysis).  There are three types of vascular accesses.The type of access that is best for you depends on the size and strength of your   veins, your age, and any other health problems that you have, such as diabetes.  A fistula is usually the preferred type of access, although it is not an option for everyone. It can last several years and is less likely than the other types of accesses to become infected or to cause a blood clot within a blood vessel (thrombosis).  Wear a medical alert bracelet. In case of an emergency, this tells health care providers that you are a dialysis patient. This information is not intended to replace advice given to you by your health care provider. Make sure you discuss any questions you have with your health care provider. Document Released: 08/03/2002 Document Revised: 06/07/2016 Document Reviewed: 06/07/2016 Elsevier Interactive Patient Education  2019 [employer].

## 2018-11-03 ENCOUNTER — Encounter: Payer: Self-pay | Admitting: Family

## 2018-11-03 ENCOUNTER — Ambulatory Visit: Payer: Medicaid Other | Attending: Family | Admitting: Family

## 2018-11-03 ENCOUNTER — Other Ambulatory Visit: Payer: Self-pay

## 2018-11-03 VITALS — BP 138/78 | HR 70 | Temp 98.1°F | Resp 18 | Ht 73.0 in | Wt 210.0 lb

## 2018-11-03 DIAGNOSIS — Z86711 Personal history of pulmonary embolism: Secondary | ICD-10-CM | POA: Insufficient documentation

## 2018-11-03 DIAGNOSIS — I739 Peripheral vascular disease, unspecified: Secondary | ICD-10-CM | POA: Diagnosis not present

## 2018-11-03 DIAGNOSIS — I252 Old myocardial infarction: Secondary | ICD-10-CM | POA: Diagnosis not present

## 2018-11-03 DIAGNOSIS — Z7901 Long term (current) use of anticoagulants: Secondary | ICD-10-CM | POA: Diagnosis not present

## 2018-11-03 DIAGNOSIS — I5022 Chronic systolic (congestive) heart failure: Secondary | ICD-10-CM | POA: Diagnosis not present

## 2018-11-03 DIAGNOSIS — Z79899 Other long term (current) drug therapy: Secondary | ICD-10-CM | POA: Insufficient documentation

## 2018-11-03 DIAGNOSIS — I509 Heart failure, unspecified: Secondary | ICD-10-CM | POA: Diagnosis present

## 2018-11-03 DIAGNOSIS — Z7982 Long term (current) use of aspirin: Secondary | ICD-10-CM | POA: Diagnosis not present

## 2018-11-03 DIAGNOSIS — Z992 Dependence on renal dialysis: Secondary | ICD-10-CM | POA: Insufficient documentation

## 2018-11-03 DIAGNOSIS — Z955 Presence of coronary angioplasty implant and graft: Secondary | ICD-10-CM | POA: Diagnosis not present

## 2018-11-03 DIAGNOSIS — Z89512 Acquired absence of left leg below knee: Secondary | ICD-10-CM | POA: Diagnosis not present

## 2018-11-03 DIAGNOSIS — N186 End stage renal disease: Secondary | ICD-10-CM | POA: Insufficient documentation

## 2018-11-03 DIAGNOSIS — Z8249 Family history of ischemic heart disease and other diseases of the circulatory system: Secondary | ICD-10-CM | POA: Insufficient documentation

## 2018-11-03 DIAGNOSIS — I2510 Atherosclerotic heart disease of native coronary artery without angina pectoris: Secondary | ICD-10-CM | POA: Diagnosis not present

## 2018-11-03 DIAGNOSIS — J449 Chronic obstructive pulmonary disease, unspecified: Secondary | ICD-10-CM | POA: Diagnosis not present

## 2018-11-03 DIAGNOSIS — Z7951 Long term (current) use of inhaled steroids: Secondary | ICD-10-CM | POA: Insufficient documentation

## 2018-11-03 DIAGNOSIS — E785 Hyperlipidemia, unspecified: Secondary | ICD-10-CM | POA: Diagnosis not present

## 2018-11-03 DIAGNOSIS — Z87442 Personal history of urinary calculi: Secondary | ICD-10-CM | POA: Insufficient documentation

## 2018-11-03 DIAGNOSIS — Z9981 Dependence on supplemental oxygen: Secondary | ICD-10-CM | POA: Insufficient documentation

## 2018-11-03 DIAGNOSIS — G4733 Obstructive sleep apnea (adult) (pediatric): Secondary | ICD-10-CM | POA: Diagnosis not present

## 2018-11-03 DIAGNOSIS — F17210 Nicotine dependence, cigarettes, uncomplicated: Secondary | ICD-10-CM | POA: Insufficient documentation

## 2018-11-03 DIAGNOSIS — I10 Essential (primary) hypertension: Secondary | ICD-10-CM

## 2018-11-03 DIAGNOSIS — K219 Gastro-esophageal reflux disease without esophagitis: Secondary | ICD-10-CM | POA: Insufficient documentation

## 2018-11-03 DIAGNOSIS — I132 Hypertensive heart and chronic kidney disease with heart failure and with stage 5 chronic kidney disease, or end stage renal disease: Secondary | ICD-10-CM | POA: Insufficient documentation

## 2018-11-03 NOTE — Progress Notes (Signed)
Patient ID: [name], male    DOB: 11/22/1955, 63 y.o.   MRN: [id number]  HPI  [name] is a 63 y/o male with a history of asthma, CAD, hyperlipidemia, HTN, CKD (ESRD on dialysis), anemia, COPD, GERD, PVD, MI, obstructive sleep apnea, left BKA, current tobacco use and chronic heart failure.   Echo report from 08/10/2018 reviewed and showed an EF of 20-25%. Echo report from 07/23/17 reviewed and showed an EF of 20-25%.  Was in the ED 09/20/2018 due to chest pain where he was treated and released. Admitted 08/10/2018 due to NSTEMI. Given IV heparin but then needed IV protonix after stool hemoccult came back positive. GI and cardiology consults obtained. Given PRBC infusion. Initially hypotensive.  Discharged after 8 days. Was in the ED 07/22/2018 due to nosebleed that wouldn't stop. Given Afrin but bleeding continued. Had to place merocel sponge and given antibiotics and then released with ENT follow-up.   He presents today for a follow-up visit with a chief complaint of moderate shortness of breath upon minimal exertion. He describes this as chronic in nature having been present for several years. He has associated fatigue, weakness and continued chest pain. He denies any difficulty sleeping, abdominal distention, palpitations, pedal edema or dizziness. Continues with dialysis on M, W, F. Wearing oxygen at 3L at bedtime and during the day as needed.     Past Medical History:  Diagnosis Date  . 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 Amputee, below knee, left ([place])   . Anemia   . Asthma   . Cardiomyopathy ([place])   . CHF (congestive heart failure) ([place])   . Chronic systolic heart failure ([place])   . Complication of anesthesia    hypotension  . COPD (chronic obstructive pulmonary disease) ([place])   . Coronary artery disease   . Dialysis patient ([place])    Mon, Wed, Fri  . End stage renal disease ([place])   . GERD (gastroesophageal reflux disease)   . Headache   . History of kidney  stones   . History of pulmonary embolism   . HLD (hyperlipidemia)   . HTN (hypertension)   . Hyperparathyroidism   . Myocardial infarction ([place])   . Peripheral vascular disease ([place])   . Shortness of breath dyspnea   . Sleep apnea    NO C-PAP, Patient stated in process of  "getting one"   . Tobacco dependence    Past Surgical History:  Procedure Laterality Date  . A/V FISTULAGRAM Left 08/14/2017   Procedure: A/V FISTULAGRAM;  Surgeon: Algernon Huxley, MD;  Location: [facility];  Service: Cardiovascular;  Laterality: Left;  . AMPUTATION Left 05/06/2014   Procedure: AMPUTATION BELOW KNEE;  Surgeon: Elam Dutch, MD;  Location: [facility];  Service: Vascular;  Laterality: Left;  . AMPUTATION Right 01/12/2015   Procedure: Foot transmetatarsal amputation;  Surgeon: Algernon Huxley, MD;  Location: [facility];  Service: Vascular;  Laterality: Right;  . APPLICATION OF WOUND VAC Right 03/01/2015   Procedure: Application of Bio-connekt graft and wound vac application to right foot ;  Surgeon: Algernon Huxley, MD;  Location: [facility];  Service: Vascular;  Laterality: Right;  . AV FISTULA PLACEMENT Left   . AV FISTULA PLACEMENT Left 11/28/2016   Procedure: ARTERIOVENOUS (AV) FISTULA CREATION;  Surgeon: Algernon Huxley, MD;  Location: [facility];  Service: Vascular;  Laterality: Left;  . CARDIAC CATHETERIZATION     stent placement   .  CORONARY ANGIOPLASTY    . DIALYSIS/PERMA CATHETER INSERTION N/A 07/31/2016   Procedure: Dialysis/Perma Catheter Insertion;  Surgeon: Algernon Huxley, MD;  Location: [facility];  Service: Cardiovascular;  Laterality: N/A;  . DIALYSIS/PERMA CATHETER INSERTION N/A 07/25/2017   Procedure: DIALYSIS/PERMA CATHETER INSERTION and fistulagram;  Surgeon: Algernon Huxley, MD;  Location: [facility];  Service: Cardiovascular;  Laterality: N/A;  . DIALYSIS/PERMA CATHETER REMOVAL N/A 07/22/2017   Procedure: DIALYSIS/PERMA CATHETER REMOVAL;  Surgeon: Katha Cabal, MD;  Location:  [facility];  Service: Cardiovascular;  Laterality: N/A;  . DIALYSIS/PERMA CATHETER REMOVAL N/A 02/05/2018   Procedure: DIALYSIS/PERMA CATHETER REMOVAL;  Surgeon: Algernon Huxley, MD;  Location: [facility];  Service: Cardiovascular;  Laterality: N/A;  . IR FLUORO GUIDE CV LINE LEFT  04/10/2017  . LEFT HEART CATH AND CORONARY ANGIOGRAPHY N/A 08/11/2018   Procedure: LEFT HEART CATH AND CORONARY ANGIOGRAPHY;  Surgeon: Corey Skains, MD;  Location: [facility];  Service: Cardiovascular;  Laterality: N/A;  . LIGATION OF ARTERIOVENOUS  FISTULA Right 01/31/2016   Procedure: LIGATION OF ARTERIOVENOUS  FISTULA;  Surgeon: Algernon Huxley, MD;  Location: [facility];  Service: Vascular;  Laterality: Right;  . PERIPHERAL VASCULAR CATHETERIZATION Right 12/15/2014   Procedure: Lower Extremity Angiography;  Surgeon: Algernon Huxley, MD;  Location: [facility];  Service: Cardiovascular;  Laterality: Right;  . PERIPHERAL VASCULAR CATHETERIZATION  12/15/2014   Procedure: Lower Extremity Intervention;  Surgeon: Algernon Huxley, MD;  Location: [facility];  Service: Cardiovascular;;  . PERIPHERAL VASCULAR CATHETERIZATION Right 08/14/2015   Procedure: A/V Shuntogram/Fistulagram;  Surgeon: Algernon Huxley, MD;  Location: [facility];  Service: Cardiovascular;  Laterality: Right;  . PERIPHERAL VASCULAR CATHETERIZATION N/A 08/14/2015   Procedure: A/V Shunt Intervention;  Surgeon: Algernon Huxley, MD;  Location: [facility];  Service: Cardiovascular;  Laterality: N/A;  . PERIPHERAL VASCULAR CATHETERIZATION N/A 01/11/2016   Procedure: Dialysis/Perma Catheter Insertion;  Surgeon: Algernon Huxley, MD;  Location: [facility];  Service: Cardiovascular;  Laterality: N/A;  . REVISON OF ARTERIOVENOUS FISTULA Right 02/17/2016   Procedure: removal of AV fistula;  Surgeon: Serafina Mitchell, MD;  Location: [facility];  Service: Vascular;  Laterality: Right;  . REVISON OF ARTERIOVENOUS FISTULA Right  01/31/2016   Procedure: REVISON OF ARTERIOVENOUS FISTULA ( BRACHIOCEPHALIC ) W/ ARTEGRAFT;  Surgeon: Algernon Huxley, MD;  Location: [facility];  Service: Vascular;  Laterality: Right;  . TRANSMETATARSAL AMPUTATION Right 05/04/2015   Procedure: TRANSMETATARSAL AMPUTATION REVISION, great toe amputation;  Surgeon: Algernon Huxley, MD;  Location: [facility];  Service: Vascular;  Laterality: Right;   Family History  Problem Relation Age of Onset  . Leukemia Mother   . Heart attack Father   . Heart failure Other   . Hypertension Other   . Leukemia Other   . Diabetes Other   . Prostate cancer Neg Hx   . Kidney cancer Neg Hx   . Bladder Cancer Neg Hx    Social History   Tobacco Use  . Smoking status: Light Tobacco Smoker    Packs/day: 0.25    Years: 30.00    Pack years: 7.50    Types: Cigarettes  . Smokeless tobacco: Never Used  . Tobacco comment: 5  Substance Use Topics  . Alcohol use: No    Alcohol/week: 0.0 standard drinks   Allergies  Allergen Reactions  . Dust Mite Extract Other (See  Comments)    Reaction: unknown   Prior to Admission medications   Medication Sig Start Date End Date Taking? Authorizing Provider  acetaminophen (TYLENOL) 500 MG tablet Take 500 mg by mouth every 6 (six) hours as needed for mild pain.    Yes [provider]  albuterol (PROVENTIL HFA;VENTOLIN HFA) 108 (90 Base) MCG/ACT inhaler Inhale 2 puffs into the lungs every 6 (six) hours as needed for wheezing or shortness of breath.    Yes [provider]  albuterol (PROVENTIL) (2.5 MG/3ML) 0.083% nebulizer solution Take 3 mLs (2.5 mg total) by nebulization every 6 (six) hours as needed for wheezing or shortness of breath. 04/24/18  Yes Gladstone Lighter, MD  apixaban (ELIQUIS) 5 MG TABS tablet Take 1 tablet (5 mg total) by mouth 2 (two) times daily. 01/20/17  Yes Gladstone Lighter, MD  aspirin EC 325 MG tablet Take 325 mg by mouth daily.    Yes [provider]  atorvastatin (LIPITOR) 10 MG  tablet Take 10 mg by mouth at bedtime.    Yes [provider]  budesonide-formoterol (SYMBICORT) 160-4.5 MCG/ACT inhaler Inhale 2 puffs into the lungs 2 (two) times daily.   Yes [provider]  calcium acetate (PHOSLO) 667 MG capsule Take 667 mg by mouth daily.    Yes [provider]  carvedilol (COREG) 6.25 MG tablet Take 1 tablet (6.25 mg total) by mouth 2 (two) times daily. 10/07/17  Yes Levante Simones, Otila Kluver A, FNP  cetirizine (ZYRTEC) 10 MG tablet Take 10 mg by mouth daily.   Yes [provider]  docusate sodium (COLACE) 100 MG capsule Take 100 mg by mouth daily as needed for mild constipation or moderate constipation.    Yes [provider]  erythromycin ophthalmic ointment Place 1 application into the right eye at bedtime.   Yes [provider]  fluticasone (FLONASE) 50 MCG/ACT nasal spray Place 1 spray into both nostrils daily.    Yes [provider]  guaifenesin (ROBAFEN) 100 MG/5ML syrup Take 200 mg by mouth 3 (three) times daily as needed for cough.   Yes [provider]  hydrocortisone 2.5 % cream Apply 1 application topically 2 (two) times daily as needed (itching).    Yes [provider]  Ipratropium-Albuterol (COMBIVENT RESPIMAT) 20-100 MCG/ACT AERS respimat Inhale 1 puff into the lungs every 6 (six) hours as needed for wheezing or shortness of breath.   Yes [provider]  isosorbide mononitrate (IMDUR) 30 MG 24 hr tablet Take 30 mg by mouth daily.  08/22/16  Yes [provider]  midodrine (PROAMATINE) 10 MG tablet Take 10 mg by mouth every 8 (eight) hours.    Yes [provider]  Multiple Vitamins-Minerals (THERA-M PO) Take 1 tablet by mouth 2 (two) times daily.   Yes [provider]  nitroGLYCERIN (NITROSTAT) 0.4 MG SL tablet Place 0.4 mg under the tongue every 5 (five) minutes as needed for chest pain.   Yes [provider]  pantoprazole (PROTONIX) 20 MG tablet Take  20 mg by mouth daily.   Yes [provider]  polyethylene glycol (MIRALAX / GLYCOLAX) packet Take 17 g by mouth daily as needed for mild constipation.    Yes [provider]  pregabalin (LYRICA) 75 MG capsule Take 1 capsule (75 mg total) by mouth daily. 01/22/17  Yes Gladstone Lighter, MD  sevelamer carbonate (RENVELA) 800 MG tablet Take 800 mg by mouth 3 (three) times daily with meals.    Yes [provider]  tiotropium (SPIRIVA) 18 MCG inhalation capsule Place 18 mcg into inhaler and inhale daily.   Yes [provider]  traMADol (ULTRAM) 50 MG tablet Take 75 mg by mouth every 8 (eight) hours as needed (pain).   Yes [provider]    Review of Systems  Constitutional: Positive for fatigue (minimal). Negative for appetite change.  HENT: Negative for congestion, postnasal drip and sore throat.   Eyes: Negative for visual disturbance.  Respiratory: Positive for shortness of breath (with minimal exertion). Negative for chest tightness.   Cardiovascular: Positive for chest pain. Negative for palpitations.  Gastrointestinal: Negative for abdominal distention and abdominal pain.  Endocrine: Negative.   Genitourinary: Negative.   Musculoskeletal: Positive for arthralgias (left arm pain). Negative for back pain.  Skin: Negative.   Allergic/Immunologic: Negative.   Neurological: Positive for weakness (right leg). Negative for dizziness and light-headedness.  Hematological: Negative for adenopathy. Does not bruise/bleed easily.  Psychiatric/Behavioral: Negative for dysphoric mood and sleep disturbance (wearing oxygen/ humidifier at bedtime). The patient is not nervous/anxious.    Vitals:   11/03/18 1335  BP: 138/78  Pulse: 70  Resp: 18  Temp: 98.1 F (36.7 C)  SpO2: 97%  Weight: 210 lb (95.3 kg)  Height: 6\' 1"  (1.854 m)   Wt Readings from Last 3 Encounters:  11/03/18 210 lb (95.3 kg)  09/20/18 210 lb (95.3 kg)  08/18/18 206 lb 12.7 oz (93.8  kg)   Lab Results  Component Value Date   CREATININE 8.68 (H) 09/20/2018   CREATININE 9.31 (H) 08/16/2018   CREATININE 7.03 (H) 08/15/2018    Physical Exam  Constitutional: He is oriented to person, place, and time. He appears well-developed and well-nourished.  HENT:  Head: Normocephalic and atraumatic.  Neck: Normal range of motion. Neck supple. No JVD present.  Cardiovascular: Normal rate and regular rhythm.  Pulmonary/Chest: Effort normal. He has no wheezes. He has no rales.  Abdominal: Soft. He exhibits no distension. There is no abdominal tenderness.  Musculoskeletal:        General: Edema (trace pitting in right lower leg) present. No tenderness.     Comments: No palpable chest tenderness  Neurological: He is alert and oriented to person, place, and time.  Skin: Skin is warm and dry.  Psychiatric: He has a normal mood and affect. His behavior is normal. Thought content normal.  Nursing note and vitals reviewed.  Assessment & Plan:  1: Chronic heart failure with reduced ejection fraction- - NYHA class III - euvolemic today - not being weighed due to left BKA and weakness in his right lower leg  - not adding salt to his food and he says that [employer] with salt either - saw cardiology Nehemiah Massed) 10/29/2018 - saw pulmonologist Ashby Dawes) 12/02/17 - due to history of hyperkalemia, doubtful we can use entresto - BNP on 08/04/2018 was 629.0 - wearing oxygen at 3L at bedtime and during the day as needed  2: HTN- - BP looks good - sees PCP Clide Deutscher) at [facility] on 09/20/2018 reviewed and showed sodium 139, potassium 3.7, creatinine 8.68 and GFR 7  3: ESRD- - receives dialysis M, W, F - saw vascular ([name]) 10/27/2018 - saw wound center Joaquim Lai) 09/10/2018  Facility medication list was reviewed.  Return in 4 months or sooner for any questions/problems before then.

## 2018-11-03 NOTE — Patient Instructions (Signed)
Continue weighing daily and call for an overnight weight gain of > 2 pounds or a weekly weight gain of >5 pounds. 

## 2018-11-12 ENCOUNTER — Ambulatory Visit: Payer: Medicare Other | Admitting: Gastroenterology

## 2018-11-13 ENCOUNTER — Ambulatory Visit: Payer: Medicaid Other | Admitting: Gastroenterology

## 2018-11-15 ENCOUNTER — Emergency Department: Payer: Medicaid Other

## 2018-11-15 ENCOUNTER — Inpatient Hospital Stay
Admission: EM | Admit: 2018-11-15 | Discharge: 2018-11-21 | DRG: 280 | Disposition: A | Discharge status: Hospice | Payer: Medicaid Other | Attending: Specialist | Admitting: Specialist

## 2018-11-15 ENCOUNTER — Encounter: Payer: Self-pay | Admitting: Physician Assistant

## 2018-11-15 ENCOUNTER — Other Ambulatory Visit: Payer: Self-pay

## 2018-11-15 DIAGNOSIS — Z1159 Encounter for screening for other viral diseases: Secondary | ICD-10-CM | POA: Diagnosis not present

## 2018-11-15 DIAGNOSIS — I214 Non-ST elevation (NSTEMI) myocardial infarction: Principal | ICD-10-CM | POA: Diagnosis present

## 2018-11-15 DIAGNOSIS — R079 Chest pain, unspecified: Secondary | ICD-10-CM | POA: Diagnosis present

## 2018-11-15 DIAGNOSIS — F17210 Nicotine dependence, cigarettes, uncomplicated: Secondary | ICD-10-CM | POA: Diagnosis present

## 2018-11-15 DIAGNOSIS — Z515 Encounter for palliative care: Secondary | ICD-10-CM | POA: Diagnosis present

## 2018-11-15 DIAGNOSIS — R509 Fever, unspecified: Secondary | ICD-10-CM | POA: Diagnosis not present

## 2018-11-15 DIAGNOSIS — G4730 Sleep apnea, unspecified: Secondary | ICD-10-CM | POA: Diagnosis present

## 2018-11-15 DIAGNOSIS — I252 Old myocardial infarction: Secondary | ICD-10-CM

## 2018-11-15 DIAGNOSIS — Z89512 Acquired absence of left leg below knee: Secondary | ICD-10-CM

## 2018-11-15 DIAGNOSIS — I5023 Acute on chronic systolic (congestive) heart failure: Secondary | ICD-10-CM | POA: Diagnosis present

## 2018-11-15 DIAGNOSIS — Z66 Do not resuscitate: Secondary | ICD-10-CM | POA: Diagnosis present

## 2018-11-15 DIAGNOSIS — I132 Hypertensive heart and chronic kidney disease with heart failure and with stage 5 chronic kidney disease, or end stage renal disease: Secondary | ICD-10-CM | POA: Diagnosis present

## 2018-11-15 DIAGNOSIS — I447 Left bundle-branch block, unspecified: Secondary | ICD-10-CM | POA: Diagnosis present

## 2018-11-15 DIAGNOSIS — E213 Hyperparathyroidism, unspecified: Secondary | ICD-10-CM | POA: Diagnosis present

## 2018-11-15 DIAGNOSIS — E875 Hyperkalemia: Secondary | ICD-10-CM | POA: Diagnosis not present

## 2018-11-15 DIAGNOSIS — I429 Cardiomyopathy, unspecified: Secondary | ICD-10-CM | POA: Diagnosis present

## 2018-11-15 DIAGNOSIS — I739 Peripheral vascular disease, unspecified: Secondary | ICD-10-CM | POA: Diagnosis present

## 2018-11-15 DIAGNOSIS — Z992 Dependence on renal dialysis: Secondary | ICD-10-CM | POA: Diagnosis not present

## 2018-11-15 DIAGNOSIS — K219 Gastro-esophageal reflux disease without esophagitis: Secondary | ICD-10-CM | POA: Diagnosis present

## 2018-11-15 DIAGNOSIS — Z79899 Other long term (current) drug therapy: Secondary | ICD-10-CM

## 2018-11-15 DIAGNOSIS — I9589 Other hypotension: Secondary | ICD-10-CM | POA: Diagnosis present

## 2018-11-15 DIAGNOSIS — Z91048 Other nonmedicinal substance allergy status: Secondary | ICD-10-CM

## 2018-11-15 DIAGNOSIS — G4733 Obstructive sleep apnea (adult) (pediatric): Secondary | ICD-10-CM | POA: Diagnosis present

## 2018-11-15 DIAGNOSIS — Z89421 Acquired absence of other right toe(s): Secondary | ICD-10-CM

## 2018-11-15 DIAGNOSIS — Z8249 Family history of ischemic heart disease and other diseases of the circulatory system: Secondary | ICD-10-CM

## 2018-11-15 DIAGNOSIS — J449 Chronic obstructive pulmonary disease, unspecified: Secondary | ICD-10-CM | POA: Diagnosis present

## 2018-11-15 DIAGNOSIS — N186 End stage renal disease: Secondary | ICD-10-CM | POA: Diagnosis present

## 2018-11-15 DIAGNOSIS — Z7189 Other specified counseling: Secondary | ICD-10-CM | POA: Diagnosis not present

## 2018-11-15 DIAGNOSIS — Z86711 Personal history of pulmonary embolism: Secondary | ICD-10-CM

## 2018-11-15 DIAGNOSIS — I2510 Atherosclerotic heart disease of native coronary artery without angina pectoris: Secondary | ICD-10-CM | POA: Diagnosis present

## 2018-11-15 DIAGNOSIS — Z87442 Personal history of urinary calculi: Secondary | ICD-10-CM

## 2018-11-15 DIAGNOSIS — D631 Anemia in chronic kidney disease: Secondary | ICD-10-CM | POA: Diagnosis present

## 2018-11-15 DIAGNOSIS — Z7982 Long term (current) use of aspirin: Secondary | ICD-10-CM

## 2018-11-15 DIAGNOSIS — Z7951 Long term (current) use of inhaled steroids: Secondary | ICD-10-CM

## 2018-11-15 DIAGNOSIS — E8770 Fluid overload, unspecified: Secondary | ICD-10-CM | POA: Diagnosis present

## 2018-11-15 DIAGNOSIS — E785 Hyperlipidemia, unspecified: Secondary | ICD-10-CM | POA: Diagnosis present

## 2018-11-15 LAB — CBC WITH DIFFERENTIAL/PLATELET
Abs Immature Granulocytes: 0.04 10*3/uL (ref 0.00–0.07)
Basophils Absolute: 0 10*3/uL (ref 0.0–0.1)
Basophils Relative: 0 %
Eosinophils Absolute: 0.1 10*3/uL (ref 0.0–0.5)
Eosinophils Relative: 2 %
HCT: 45.8 % (ref 39.0–52.0)
Hemoglobin: 14.5 g/dL (ref 13.0–17.0)
Immature Granulocytes: 0 %
Lymphocytes Relative: 16 %
Lymphs Abs: 1.5 10*3/uL (ref 0.7–4.0)
MCH: 28.8 pg (ref 26.0–34.0)
MCHC: 31.7 g/dL (ref 30.0–36.0)
MCV: 91.1 fL (ref 80.0–100.0)
Monocytes Absolute: 1.8 10*3/uL — ABNORMAL HIGH (ref 0.1–1.0)
Monocytes Relative: 19 %
Neutro Abs: 6 10*3/uL (ref 1.7–7.7)
Neutrophils Relative %: 63 %
Platelets: 142 10*3/uL — ABNORMAL LOW (ref 150–400)
RBC: 5.03 MIL/uL (ref 4.22–5.81)
RDW: 17.1 % — ABNORMAL HIGH (ref 11.5–15.5)
WBC: 9.5 10*3/uL (ref 4.0–10.5)
nRBC: 0 % (ref 0.0–0.2)

## 2018-11-15 LAB — PROTIME-INR
INR: 1 (ref 0.8–1.2)
Prothrombin Time: 13 seconds (ref 11.4–15.2)

## 2018-11-15 LAB — COMPREHENSIVE METABOLIC PANEL
ALT: 34 U/L (ref 0–44)
AST: 34 U/L (ref 15–41)
Albumin: 3.5 g/dL (ref 3.5–5.0)
Alkaline Phosphatase: 139 U/L — ABNORMAL HIGH (ref 38–126)
Anion gap: 15 (ref 5–15)
BUN: 62 mg/dL — ABNORMAL HIGH (ref 8–23)
CO2: 27 mmol/L (ref 22–32)
Calcium: 10.1 mg/dL (ref 8.9–10.3)
Chloride: 101 mmol/L (ref 98–111)
Creatinine, Ser: 10.6 mg/dL — ABNORMAL HIGH (ref 0.61–1.24)
GFR calc Af Amer: 5 mL/min — ABNORMAL LOW (ref >60)
GFR calc non Af Amer: 5 mL/min — ABNORMAL LOW (ref >60)
Glucose, Bld: 121 mg/dL — ABNORMAL HIGH (ref 70–99)
Potassium: 5.3 mmol/L — ABNORMAL HIGH (ref 3.5–5.1)
Sodium: 143 mmol/L (ref 135–145)
Total Bilirubin: 0.6 mg/dL (ref 0.3–1.2)
Total Protein: 8.3 g/dL — ABNORMAL HIGH (ref 6.5–8.1)

## 2018-11-15 LAB — LACTIC ACID, PLASMA: Lactic Acid, Venous: 1 mmol/L (ref 0.5–1.9)

## 2018-11-15 LAB — BRAIN NATRIURETIC PEPTIDE: B Natriuretic Peptide: 2274 pg/mL — ABNORMAL HIGH (ref 0.0–100.0)

## 2018-11-15 LAB — SARS CORONAVIRUS 2 BY RT PCR (HOSPITAL ORDER, PERFORMED IN ~~LOC~~ HOSPITAL LAB): SARS Coronavirus 2: NEGATIVE

## 2018-11-15 LAB — APTT: aPTT: 31 seconds (ref 24–36)

## 2018-11-15 LAB — TROPONIN I: Troponin I: 0.96 ng/mL (ref <0.03)

## 2018-11-15 MED ORDER — ERYTHROMYCIN 5 MG/GM OP OINT
1.0000 "application " | TOPICAL_OINTMENT | Freq: Every day | OPHTHALMIC | Status: DC
  Administered 2018-11-17: 1 via OPHTHALMIC
  Filled 2018-11-15: qty 1

## 2018-11-15 MED ORDER — LORATADINE 10 MG PO TABS
10.0000 mg | ORAL_TABLET | Freq: Every day | ORAL | Status: DC
  Administered 2018-11-16 – 2018-11-19 (×3): 10 mg via ORAL
  Filled 2018-11-15 (×3): qty 1

## 2018-11-15 MED ORDER — TIOTROPIUM BROMIDE MONOHYDRATE 18 MCG IN CAPS
18.0000 ug | ORAL_CAPSULE | Freq: Every day | RESPIRATORY_TRACT | Status: DC
  Administered 2018-11-17 – 2018-11-19 (×2): 18 ug via RESPIRATORY_TRACT
  Filled 2018-11-15: qty 5

## 2018-11-15 MED ORDER — DOCUSATE SODIUM 100 MG PO CAPS: 100.0000 mg | ORAL_CAPSULE | Freq: Every day | ORAL | Status: DC | PRN

## 2018-11-15 MED ORDER — NITROGLYCERIN 0.4 MG SL SUBL: 0.4000 mg | SUBLINGUAL_TABLET | SUBLINGUAL | Status: DC | PRN

## 2018-11-15 MED ORDER — POLYETHYLENE GLYCOL 3350 17 G PO PACK: 17.0000 g | PACK | Freq: Every day | ORAL | Status: DC | PRN

## 2018-11-15 MED ORDER — FENTANYL CITRATE (PF) 100 MCG/2ML IJ SOLN
25.0000 ug | Freq: Once | INTRAMUSCULAR | Status: AC
  Administered 2018-11-15: 22:00:00 25 ug via INTRAVENOUS
  Filled 2018-11-15: qty 2

## 2018-11-15 MED ORDER — ALBUTEROL SULFATE HFA 108 (90 BASE) MCG/ACT IN AERS: 2.0000 | INHALATION_SPRAY | Freq: Four times a day (QID) | RESPIRATORY_TRACT | Status: DC | PRN

## 2018-11-15 MED ORDER — FLUTICASONE PROPIONATE 50 MCG/ACT NA SUSP
1.0000 | Freq: Every day | NASAL | Status: DC
  Filled 2018-11-15 (×2): qty 16

## 2018-11-15 MED ORDER — SEVELAMER CARBONATE 800 MG PO TABS
800.0000 mg | ORAL_TABLET | Freq: Three times a day (TID) | ORAL | Status: DC
  Administered 2018-11-17 – 2018-11-19 (×3): 800 mg via ORAL
  Filled 2018-11-15 (×4): qty 1

## 2018-11-15 MED ORDER — ATORVASTATIN CALCIUM 10 MG PO TABS
10.0000 mg | ORAL_TABLET | Freq: Every day | ORAL | Status: DC
  Administered 2018-11-16 – 2018-11-18 (×3): 10 mg via ORAL
  Filled 2018-11-15 (×3): qty 1

## 2018-11-15 MED ORDER — ADULT MULTIVITAMIN W/MINERALS CH
1.0000 | ORAL_TABLET | Freq: Two times a day (BID) | ORAL | Status: DC
  Administered 2018-11-16 – 2018-11-19 (×6): 1 via ORAL
  Filled 2018-11-15 (×6): qty 1

## 2018-11-15 MED ORDER — HEPARIN (PORCINE) 25000 UT/250ML-% IV SOLN
1750.0000 [IU]/h | INTRAVENOUS | Status: DC
  Administered 2018-11-16 – 2018-11-17 (×3): 1350 [IU]/h via INTRAVENOUS
  Administered 2018-11-18: 1600 [IU]/h via INTRAVENOUS
  Filled 2018-11-15 (×5): qty 250

## 2018-11-15 MED ORDER — CARVEDILOL 6.25 MG PO TABS: 6.2500 mg | ORAL_TABLET | Freq: Two times a day (BID) | ORAL | Status: DC

## 2018-11-15 MED ORDER — TRAMADOL HCL 50 MG PO TABS: 75.0000 mg | ORAL_TABLET | Freq: Two times a day (BID) | ORAL | Status: DC | PRN

## 2018-11-15 MED ORDER — MIDODRINE HCL 5 MG PO TABS
10.0000 mg | ORAL_TABLET | Freq: Three times a day (TID) | ORAL | Status: DC
  Administered 2018-11-16 – 2018-11-19 (×8): 10 mg via ORAL
  Filled 2018-11-15 (×10): qty 2

## 2018-11-15 MED ORDER — GUAIFENESIN 100 MG/5ML PO SOLN
200.0000 mg | Freq: Three times a day (TID) | ORAL | Status: DC | PRN
  Administered 2018-11-21: 200 mg via ORAL
  Filled 2018-11-15 (×4): qty 10

## 2018-11-15 MED ORDER — PANTOPRAZOLE SODIUM 40 MG PO TBEC
40.0000 mg | DELAYED_RELEASE_TABLET | Freq: Every day | ORAL | Status: DC
  Administered 2018-11-16 – 2018-11-19 (×3): 40 mg via ORAL
  Filled 2018-11-15 (×3): qty 1

## 2018-11-15 MED ORDER — ISOSORBIDE MONONITRATE ER 60 MG PO TB24: 30.0000 mg | ORAL_TABLET | Freq: Every day | ORAL | Status: DC

## 2018-11-15 MED ORDER — MORPHINE SULFATE (PF) 2 MG/ML IV SOLN: 2.0000 mg | INTRAVENOUS | Status: DC | PRN

## 2018-11-15 MED ORDER — ASPIRIN EC 325 MG PO TBEC
325.0000 mg | DELAYED_RELEASE_TABLET | Freq: Every day | ORAL | Status: DC
  Administered 2018-11-16 – 2018-11-17 (×2): 325 mg via ORAL
  Filled 2018-11-15 (×2): qty 1

## 2018-11-15 MED ORDER — CALCIUM ACETATE (PHOS BINDER) 667 MG PO CAPS
667.0000 mg | ORAL_CAPSULE | Freq: Every day | ORAL | Status: DC
  Filled 2018-11-15 (×2): qty 1

## 2018-11-15 MED ORDER — ACETAMINOPHEN 325 MG PO TABS
650.0000 mg | ORAL_TABLET | ORAL | Status: DC | PRN
  Administered 2018-11-17 – 2018-11-19 (×3): 650 mg via ORAL
  Filled 2018-11-15 (×3): qty 2

## 2018-11-15 MED ORDER — ONDANSETRON HCL 4 MG/2ML IJ SOLN: 4.0000 mg | Freq: Four times a day (QID) | INTRAMUSCULAR | Status: DC | PRN

## 2018-11-15 MED ORDER — ALBUTEROL SULFATE (2.5 MG/3ML) 0.083% IN NEBU: 2.5000 mg | INHALATION_SOLUTION | Freq: Four times a day (QID) | RESPIRATORY_TRACT | Status: DC | PRN

## 2018-11-15 MED ORDER — PREGABALIN 75 MG PO CAPS
75.0000 mg | ORAL_CAPSULE | Freq: Every day | ORAL | Status: DC
  Administered 2018-11-16 – 2018-11-19 (×3): 75 mg via ORAL
  Filled 2018-11-15 (×3): qty 1

## 2018-11-15 MED ORDER — MORPHINE SULFATE 10 MG/5ML PO SOLN: 2.0000 mg | ORAL | Status: DC | PRN

## 2018-11-15 MED ORDER — HEPARIN BOLUS VIA INFUSION
4000.0000 [IU] | Freq: Once | INTRAVENOUS | Status: AC
  Administered 2018-11-16: 4000 [IU] via INTRAVENOUS
  Filled 2018-11-15: qty 4000

## 2018-11-15 NOTE — Progress Notes (Signed)
ANTICOAGULATION CONSULT NOTE - Initial Consult  Pharmacy Consult for heparin Indication: chest pain/ACS  Allergies  Allergen Reactions  . Dust Mite Extract Other (See Comments)    Reaction: unknown    Patient Measurements: Height: 6\' 2"  (188 cm) Weight: 211 lb 10.3 oz (96 kg) IBW/kg (Calculated) : 82.2 Heparin Dosing Weight: 96 kg  Vital Signs: Temp: 99.2 F (37.3 C) (06/21 1952) Temp Source: Oral (06/21 1952) BP: 150/83 (06/21 2000) Pulse Rate: 90 (06/21 2200)  Labs: Recent Labs    11/15/18 2117  HGB 14.5  HCT 45.8  PLT 142*  APTT 31  LABPROT 13.0  INR 1.0  CREATININE 10.60*  TROPONINI 0.96*    Estimated Creatinine Clearance: 8.2 mL/min (A) (by C-G formula based on SCr of 10.6 mg/dL (H)).   Medical History: Past Medical History:  Diagnosis Date  . 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 Amputee, below knee, left ([place])   . Anemia   . Asthma   . Cardiomyopathy ([place])   . CHF (congestive heart failure) ([place])   . Chronic systolic heart failure ([place])   . Complication of anesthesia    hypotension  . COPD (chronic obstructive pulmonary disease) ([place])   . Coronary artery disease   . Dialysis patient ([place])    Mon, Wed, Fri  . End stage renal disease ([place])   . GERD (gastroesophageal reflux disease)   . Headache   . History of kidney stones   . History of pulmonary embolism   . HLD (hyperlipidemia)   . HTN (hypertension)   . Hyperparathyroidism   . Myocardial infarction ([place])   . Peripheral vascular disease ([place])   . Shortness of breath dyspnea   . Sleep apnea    NO C-PAP, Patient stated in process of  "getting one"   . Tobacco dependence     Medications:  Scheduled:  . [START ON 11/16/2018] aspirin EC  325 mg Oral Daily  . atorvastatin  10 mg Oral QHS  . [START ON 11/16/2018] calcium acetate  667 mg Oral Daily  . carvedilol  6.25 mg Oral BID  . erythromycin  1 application Right Eye QHS  . [START ON 11/16/2018] fluticasone  1 spray Each  Nare Daily  . heparin  4,000 Units Intravenous Once  . [START ON 11/16/2018] isosorbide mononitrate  30 mg Oral Daily  . [START ON 11/16/2018] loratadine  10 mg Oral Daily  . midodrine  10 mg Oral Q8H  . [START ON 11/16/2018] pantoprazole  40 mg Oral Daily  . [START ON 11/16/2018] pregabalin  75 mg Oral Daily  . [START ON 11/16/2018] sevelamer carbonate  800 mg Oral TID WC  . Thera-M   Oral BID  . [START ON 11/16/2018] tiotropium  18 mcg Inhalation Daily    Assessment: Patient admitted for LS CP and fluid overload as evidenced by cardiomegaly on CXR. Patient has a h/o ESRD on HD MWF. BNP 2274, initial trop of 0.96; patient was here back in 04/26 w/ trops of 0.12 >> 0.11. EKG showing LBBB. Patient is not on anticoagulation PTA. Patient is being started on heparin for probable NSTEMI.  Goal of Therapy:  Heparin level 0.3-0.7 units/ml Monitor platelets by anticoagulation protocol: Yes   Plan:  Will bolus heparin 4000 units IV x 1 Will start rate at 1350 units/hr  Baseline labs WNL. Will check HL @ 0600 Will monitor daily CBC's and adjust per HL's  Tobie Lords, PharmD, BCPS Clinical Pharmacist 11/15/2018,11:44 PM

## 2018-11-15 NOTE — ED Provider Notes (Signed)
-----------------------------------------   10:36 PM on 11/15/2018 -----------------------------------------  Patient here with left-sided very reproducible chest wall pain, also shortness of breath.  He has evidence of volume overload, he states he has had pain like this when he needs dialysis.  He is a Monday Wednesday Friday dialysis but is not sure if they took off the usual amount on Friday.  He does feel dyspneic.  No fever no cough.  Chest x-ray is consistent with fluid normal white count, no known coronavirus exposure.  BNP is elevated, EKG does not show significant change from prior.  Patient I think will require admission to the hospital we have given him pain medication, he is feeling better.  PE is a differential but I think for step would be to get him dialyzed as an inpatient to see if he feels better.  Troponin is borderline, his troponin does tend to run high, BNP is elevated as well.  He is DNR/DNI.  I do not think he is a candidate for heart cath at this moment.  We have admitted to the hospitalist service and they will continue to evaluate.  He is more comfortable after pain medication.   Schuyler Amor, MD 11/15/18 2237

## 2018-11-15 NOTE — ED Notes (Signed)
Updated daughter regarding admission.  Assured daughter that I would update her again if he were to get a bed assigned during the night or a call prior to the end of my shift.

## 2018-11-15 NOTE — ED Notes (Signed)
Pink sleeve placed on left arm as pt receives dialysis through access on that arm M-W-F; pt says he did go to dialysis on Friday

## 2018-11-15 NOTE — ED Provider Notes (Signed)
[facility] Emergency Department Provider Note  ____________________________________________   None    (approximate)  I have reviewed the triage vital signs and the nursing notes.   HISTORY  Chief Complaint Chest Pain and Shortness of Breath    HPI [name] is a 63 y.o. male presents emergency department complaining of left-sided chest pain that radiates into the left arm.  Patient is also complaining of shortness of breath.  History of cardiac disease.  Dialysis patient.  States he gets dialysis in the left arm Monday Wednesday Friday.  He states chest pain has been ongoing for the past 4 days.  States it hurts all the time.  Nothing makes it better nothing makes it worse. Pt is DNR   Past Medical History:  Diagnosis Date  . 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 Amputee, below knee, left ([place])   . Anemia   . Asthma   . Cardiomyopathy ([place])   . CHF (congestive heart failure) ([place])   . Chronic systolic heart failure ([place])   . Complication of anesthesia    hypotension  . COPD (chronic obstructive pulmonary disease) ([place])   . Coronary artery disease   . Dialysis patient ([place])    Mon, Wed, Fri  . End stage renal disease ([place])   . GERD (gastroesophageal reflux disease)   . Headache   . History of kidney stones   . History of pulmonary embolism   . HLD (hyperlipidemia)   . HTN (hypertension)   . Hyperparathyroidism   . Myocardial infarction ([place])   . Peripheral vascular disease ([place])   . Shortness of breath dyspnea   . Sleep apnea    NO C-PAP, Patient stated in process of  "getting one"   . Tobacco dependence     Patient Active Problem List   Diagnosis Date Noted  . Non-STEMI (non-ST elevated myocardial infarction) ([place]) 08/10/2018  . Acute GI bleeding   . HCAP (healthcare-associated pneumonia) 04/22/2018  . Acute on chronic respiratory failure with hypoxia ([place]) 12/21/2017  . Tobacco use 10/07/2017  . Apnea   .  End-stage renal disease on hemodialysis ([place])   . Hypervolemia   . Altered mental status   . Acute on chronic systolic CHF (congestive heart failure) ([place]) 01/18/2017  . Pulmonary embolism ([place]) 08/03/2016  . NSTEMI (non-ST elevated myocardial infarction) ([place]) 07/26/2016  . PVD (peripheral vascular disease) ([place]) 03/05/2016  . Preop examination 05/02/2015  . Chronic systolic heart failure ([place])   . Hyperkalemia 01/13/2015  . Normocytic anemia 05/04/2014  . Hyperlipidemia 04/26/2010  . HYPERTENSION, BENIGN 04/26/2010  . CAD, NATIVE VESSEL 04/26/2010    Past Surgical History:  Procedure Laterality Date  . A/V FISTULAGRAM Left 08/14/2017   Procedure: A/V FISTULAGRAM;  Surgeon: Algernon Huxley, MD;  Location: [facility];  Service: Cardiovascular;  Laterality: Left;  . AMPUTATION Left 05/06/2014   Procedure: AMPUTATION BELOW KNEE;  Surgeon: Elam Dutch, MD;  Location: [facility];  Service: Vascular;  Laterality: Left;  . AMPUTATION Right 01/12/2015   Procedure: Foot transmetatarsal amputation;  Surgeon: Algernon Huxley, MD;  Location: [facility];  Service: Vascular;  Laterality: Right;  . APPLICATION OF WOUND VAC Right 03/01/2015   Procedure: Application of Bio-connekt graft and wound vac application to right foot ;  Surgeon: Algernon Huxley, MD;  Location: [facility];  Service: Vascular;  Laterality: Right;  . AV FISTULA PLACEMENT Left   . AV FISTULA PLACEMENT Left  11/28/2016   Procedure: ARTERIOVENOUS (AV) FISTULA CREATION;  Surgeon: Algernon Huxley, MD;  Location: [facility];  Service: Vascular;  Laterality: Left;  . CARDIAC CATHETERIZATION     stent placement   . CORONARY ANGIOPLASTY    . DIALYSIS/PERMA CATHETER INSERTION N/A 07/31/2016   Procedure: Dialysis/Perma Catheter Insertion;  Surgeon: Algernon Huxley, MD;  Location: [facility];  Service: Cardiovascular;  Laterality: N/A;  . DIALYSIS/PERMA CATHETER INSERTION N/A 07/25/2017   Procedure: DIALYSIS/PERMA CATHETER INSERTION and fistulagram;   Surgeon: Algernon Huxley, MD;  Location: [facility];  Service: Cardiovascular;  Laterality: N/A;  . DIALYSIS/PERMA CATHETER REMOVAL N/A 07/22/2017   Procedure: DIALYSIS/PERMA CATHETER REMOVAL;  Surgeon: Katha Cabal, MD;  Location: [facility];  Service: Cardiovascular;  Laterality: N/A;  . DIALYSIS/PERMA CATHETER REMOVAL N/A 02/05/2018   Procedure: DIALYSIS/PERMA CATHETER REMOVAL;  Surgeon: Algernon Huxley, MD;  Location: [place] [facility];  Service: Cardiovascular;  Laterality: N/A;  . IR FLUORO GUIDE CV LINE LEFT  04/10/2017  . LEFT HEART CATH AND CORONARY ANGIOGRAPHY N/A 08/11/2018   Procedure: LEFT HEART CATH AND CORONARY ANGIOGRAPHY;  Surgeon: Corey Skains, MD;  Location: [facility];  Service: Cardiovascular;  Laterality: N/A;  . LIGATION OF ARTERIOVENOUS  FISTULA Right 01/31/2016   Procedure: LIGATION OF ARTERIOVENOUS  FISTULA;  Surgeon: Algernon Huxley, MD;  Location: [facility];  Service: Vascular;  Laterality: Right;  . PERIPHERAL VASCULAR CATHETERIZATION Right 12/15/2014   Procedure: Lower Extremity Angiography;  Surgeon: Algernon Huxley, MD;  Location: [place] [facility];  Service: Cardiovascular;  Laterality: Right;  . PERIPHERAL VASCULAR CATHETERIZATION  12/15/2014   Procedure: Lower Extremity Intervention;  Surgeon: Algernon Huxley, MD;  Location: [place] [facility];  Service: Cardiovascular;;  . PERIPHERAL VASCULAR CATHETERIZATION Right 08/14/2015   Procedure: A/V Shuntogram/Fistulagram;  Surgeon: Algernon Huxley, MD;  Location: [place] [facility];  Service: Cardiovascular;  Laterality: Right;  . PERIPHERAL VASCULAR CATHETERIZATION N/A 08/14/2015   Procedure: A/V Shunt Intervention;  Surgeon: Algernon Huxley, MD;  Location: [facility] [facility];  Service: Cardiovascular;  Laterality: N/A;  . PERIPHERAL VASCULAR CATHETERIZATION N/A 01/11/2016   Procedure: Dialysis/Perma Catheter Insertion;  Surgeon: Algernon Huxley, MD;  Location: [facility];  Service:  Cardiovascular;  Laterality: N/A;  . REVISON OF ARTERIOVENOUS FISTULA Right 02/17/2016   Procedure: removal of AV fistula;  Surgeon: Serafina Mitchell, MD;  Location: [facility];  Service: Vascular;  Laterality: Right;  . REVISON OF ARTERIOVENOUS FISTULA Right 01/31/2016   Procedure: REVISON OF ARTERIOVENOUS FISTULA ( BRACHIOCEPHALIC ) W/ ARTEGRAFT;  Surgeon: Algernon Huxley, MD;  Location: [facility];  Service: Vascular;  Laterality: Right;  . TRANSMETATARSAL AMPUTATION Right 05/04/2015   Procedure: TRANSMETATARSAL AMPUTATION REVISION, great toe amputation;  Surgeon: Algernon Huxley, MD;  Location: [facility];  Service: Vascular;  Laterality: Right;    Prior to Admission medications   Medication Sig Start Date End Date Taking? Authorizing Provider  acetaminophen (TYLENOL) 500 MG tablet Take 500 mg by mouth every 6 (six) hours as needed for mild pain.     [provider]  albuterol (PROVENTIL HFA;VENTOLIN HFA) 108 (90 Base) MCG/ACT inhaler Inhale 2 puffs into the lungs every 6 (six) hours as needed for wheezing or shortness of breath.     [provider]  albuterol (PROVENTIL) (2.5 MG/3ML) 0.083% nebulizer solution Take 3 mLs (2.5 mg total) by nebulization every 6 (six) hours as needed  for wheezing or shortness of breath. 04/24/18   Gladstone Lighter, MD  aspirin EC 325 MG tablet Take 325 mg by mouth daily.     [provider]  atorvastatin (LIPITOR) 10 MG tablet Take 10 mg by mouth at bedtime.     [provider]  budesonide-formoterol (SYMBICORT) 160-4.5 MCG/ACT inhaler Inhale 2 puffs into the lungs 2 (two) times daily.    [provider]  calcium acetate (PHOSLO) 667 MG capsule Take 667 mg by mouth daily.     [provider]  carvedilol (COREG) 6.25 MG tablet Take 1 tablet (6.25 mg total) by mouth 2 (two) times daily. 10/07/17   Alisa Graff, FNP  cetirizine (ZYRTEC) 10 MG tablet Take 10 mg by mouth daily.    [provider]  docusate sodium  (COLACE) 100 MG capsule Take 100 mg by mouth daily as needed for mild constipation or moderate constipation.     [provider]  erythromycin ophthalmic ointment Place 1 application into the right eye at bedtime.    [provider]  fluticasone (FLONASE) 50 MCG/ACT nasal spray Place 1 spray into both nostrils daily.     [provider]  guaifenesin (ROBAFEN) 100 MG/5ML syrup Take 200 mg by mouth 3 (three) times daily as needed for cough.    [provider]  hydrocortisone 2.5 % cream Apply 1 application topically 2 (two) times daily as needed (itching).     [provider]  Ipratropium-Albuterol (COMBIVENT RESPIMAT) 20-100 MCG/ACT AERS respimat Inhale 1 puff into the lungs every 6 (six) hours as needed for wheezing or shortness of breath.    [provider]  isosorbide mononitrate (IMDUR) 30 MG 24 hr tablet Take 30 mg by mouth daily.  08/22/16   [provider]  midodrine (PROAMATINE) 10 MG tablet Take 10 mg by mouth every 8 (eight) hours.     [provider]  Multiple Vitamins-Minerals (THERA-M PO) Take 1 tablet by mouth 2 (two) times daily.    [provider]  nitroGLYCERIN (NITROSTAT) 0.4 MG SL tablet Place 0.4 mg under the tongue every 5 (five) minutes as needed for chest pain.    [provider]  pantoprazole (PROTONIX) 40 MG tablet Take 1 tablet (40 mg total) by mouth daily. 08/18/18   Hillary Bow, MD  polyethylene glycol (MIRALAX / GLYCOLAX) packet Take 17 g by mouth daily as needed for mild constipation.     [provider]  pregabalin (LYRICA) 75 MG capsule Take 1 capsule (75 mg total) by mouth daily. 01/22/17   Gladstone Lighter, MD  sevelamer carbonate (RENVELA) 800 MG tablet Take 800 mg by mouth 3 (three) times daily with meals.     [provider]  tiotropium (SPIRIVA) 18 MCG inhalation capsule Place 18 mcg into inhaler and inhale daily.    [provider]  traMADol  (ULTRAM) 50 MG tablet Take 75 mg by mouth every 8 (eight) hours as needed (pain).    [provider]    Allergies Dust mite extract  Family History  Problem Relation Age of Onset  . Leukemia Mother   . Heart attack Father   . Heart failure Other   . Hypertension Other   . Leukemia Other   . Diabetes Other   . Prostate cancer Neg Hx   . Kidney cancer Neg Hx   . Bladder Cancer Neg Hx     Social History Social History   Tobacco Use  . Smoking status: Light  Tobacco Smoker    Packs/day: 0.25    Years: 30.00    Pack years: 7.50    Types: Cigarettes  . Smokeless tobacco: Never Used  . Tobacco comment: 5  Substance Use Topics  . Alcohol use: No    Alcohol/week: 0.0 standard drinks  . Drug use: No    Comment: has used crack cocaine in past     Review of Systems  Constitutional: No fever/chills Eyes: No visual changes. ENT: No sore throat. Respiratory: Denies cough, positive shortness of breath Cardiovascular: Complains of chest pain with radiation to the left arm Genitourinary: Negative for dysuria. Musculoskeletal: Negative for back pain. Skin: Negative for rash.    ____________________________________________   PHYSICAL EXAM:  VITAL SIGNS: ED Triage Vitals  Enc Vitals Group     BP      Pulse      Resp      Temp      Temp src      SpO2      Weight      Height      Head Circumference      Peak Flow      Pain Score      Pain Loc      Pain Edu?      Excl. in [place]?     Constitutional: Alert and oriented. Well appearing and in mild acute distress. Eyes: Conjunctivae are normal.  Head: Atraumatic. Nose: No congestion/rhinnorhea. Mouth/Throat: Mucous membranes are moist.   Neck:  supple no lymphadenopathy noted Cardiovascular: Normal rate, regular rhythm. Heart sounds are normal, chest on the left side is tender to palpation Respiratory: Increased respiratory effort.  No retractions, lungs c t a  Abd: soft distended mild tenderness bs normal  all 4 quad GU: deferred Musculoskeletal: FROM all extremities, warm and well perfused Neurologic:  Normal speech and language.  Skin:  Skin is warm, dry and intact. No rash noted. Psychiatric: Mood and affect are normal. Speech and behavior are normal.  ____________________________________________   LABS (all labs ordered are listed, but only abnormal results are displayed)  Labs Reviewed  CULTURE, BLOOD (ROUTINE X 2)  CULTURE, BLOOD (ROUTINE X 2)  SARS CORONAVIRUS 2 (HOSPITAL ORDER, [place] [facility])  COMPREHENSIVE METABOLIC PANEL  BRAIN NATRIURETIC PEPTIDE  TROPONIN I  CBC WITH DIFFERENTIAL/PLATELET  LACTIC ACID, PLASMA  LACTIC ACID, PLASMA  PROTIME-INR  APTT   ____________________________________________   ____________________________________________  RADIOLOGY  Chest x-ray  ____________________________________________   PROCEDURES  Procedure(s) performed: [place] lock   Procedures    ____________________________________________   INITIAL IMPRESSION / ASSESSMENT AND PLAN / ED COURSE  Pertinent labs & imaging results that were available during my care of the patient were reviewed by me and considered in my medical decision making (see chart for details).   Patient 63 year old male presents emergency department via EMS from the [facility] with left-sided chest pain for 3 days.  Pain radiates into the left arm.  Some pain under the left breast and left outer chest wall.  He states it is worse when he moves his left arm.  Pain is constant.  It is not worse with exertion.   Physical exam patient has increased respiratory effort, left-sided chest tender to palpation.  Abdomen is distended.  Chest x-ray ordered Labs for CBC, metabolic panel, troponin, PT PTT, lactic acid, BNP, blood cultures x2.  And COVID test.  All test are pending at this time as it is been difficult for IV  access.  IV consult was ordered.  Patient's care is being  transferred to Dr. Burlene Arnt.  As part of my medical decision making, I reviewed the following data within the [facility] notes reviewed and incorporated, EKG interpreted normal sinus rhythm, compared to old EKG is normal, Old EKG reviewed, Old chart reviewed, Patient signed out to [facility], Notes from prior ED visits and [place] Controlled Substance Database  ____________________________________________   FINAL CLINICAL IMPRESSION(S) / ED DIAGNOSES  Final diagnoses:  Chest pain in adult      NEW MEDICATIONS STARTED DURING THIS VISIT:  New Prescriptions   No medications on file     Note:  This document was prepared using Dragon voice recognition software and may include unintentional dictation errors.    Versie Starks, PA-C 11/15/18 2053    Schuyler Amor, MD 11/15/18 2241

## 2018-11-15 NOTE — ED Triage Notes (Addendum)
Pt from [facility] via EMS with c/o left sided chest pain for 3 days, radiates down left arm; pain under left breast and left outer chest wall; pt says pain is worse when he moves his left arm; sharp constant pain; nonproductive intermittent cough; shortness of breath; pt with grunting exhalation; abd distended, skin tight; no peripheral edema noted; *left below the knee amputation

## 2018-11-15 NOTE — ED Notes (Signed)
Updated patient's daughter [name].

## 2018-11-15 NOTE — ED Notes (Signed)
Updated patient's daughter, Estrella Deeds.

## 2018-11-16 ENCOUNTER — Inpatient Hospital Stay (HOSPITAL_COMMUNITY)
Admit: 2018-11-16 | Discharge: 2018-11-16 | Disposition: A | Discharge status: Home / Self Care | Payer: Medicaid Other | Attending: Family Medicine | Admitting: Family Medicine

## 2018-11-16 DIAGNOSIS — R079 Chest pain, unspecified: Secondary | ICD-10-CM

## 2018-11-16 LAB — BASIC METABOLIC PANEL
Anion gap: 14 (ref 5–15)
BUN: 67 mg/dL — ABNORMAL HIGH (ref 8–23)
CO2: 26 mmol/L (ref 22–32)
Calcium: 9.6 mg/dL (ref 8.9–10.3)
Chloride: 100 mmol/L (ref 98–111)
Creatinine, Ser: 11.17 mg/dL — ABNORMAL HIGH (ref 0.61–1.24)
GFR calc Af Amer: 5 mL/min — ABNORMAL LOW (ref >60)
GFR calc non Af Amer: 4 mL/min — ABNORMAL LOW (ref >60)
Glucose, Bld: 110 mg/dL — ABNORMAL HIGH (ref 70–99)
Potassium: 5.2 mmol/L — ABNORMAL HIGH (ref 3.5–5.1)
Sodium: 140 mmol/L (ref 135–145)

## 2018-11-16 LAB — CBC
HCT: 44.4 % (ref 39.0–52.0)
Hemoglobin: 14.3 g/dL (ref 13.0–17.0)
MCH: 28.9 pg (ref 26.0–34.0)
MCHC: 32.2 g/dL (ref 30.0–36.0)
MCV: 89.9 fL (ref 80.0–100.0)
Platelets: 146 10*3/uL — ABNORMAL LOW (ref 150–400)
RBC: 4.94 MIL/uL (ref 4.22–5.81)
RDW: 16.9 % — ABNORMAL HIGH (ref 11.5–15.5)
WBC: 10.3 10*3/uL (ref 4.0–10.5)
nRBC: 0 % (ref 0.0–0.2)

## 2018-11-16 LAB — LIPID PANEL
Cholesterol: 132 mg/dL (ref 0–200)
HDL: 45 mg/dL (ref >40)
LDL Cholesterol: 57 mg/dL (ref 0–99)
Total CHOL/HDL Ratio: 2.9 RATIO
Triglycerides: 150 mg/dL — ABNORMAL HIGH (ref <150)
VLDL: 30 mg/dL (ref 0–40)

## 2018-11-16 LAB — LACTIC ACID, PLASMA: Lactic Acid, Venous: 1 mmol/L (ref 0.5–1.9)

## 2018-11-16 LAB — ECHOCARDIOGRAM COMPLETE
Height: 74 in
Weight: 3386.27 oz

## 2018-11-16 LAB — HEPARIN LEVEL (UNFRACTIONATED)
Heparin Unfractionated: 0.35 IU/mL (ref 0.30–0.70)
Heparin Unfractionated: 0.51 IU/mL (ref 0.30–0.70)

## 2018-11-16 LAB — TROPONIN I: Troponin I: 8.28 ng/mL (ref <0.03)

## 2018-11-16 LAB — PHOSPHORUS: Phosphorus: 4.6 mg/dL (ref 2.5–4.6)

## 2018-11-16 LAB — MRSA PCR SCREENING: MRSA by PCR: POSITIVE — AB

## 2018-11-16 LAB — GLUCOSE, CAPILLARY: Glucose-Capillary: 86 mg/dL (ref 70–99)

## 2018-11-16 MED ORDER — DICLOFENAC SODIUM 1 % TD GEL
2.0000 g | Freq: Four times a day (QID) | TRANSDERMAL | Status: DC
  Administered 2018-11-16 – 2018-11-20 (×8): 2 g via TOPICAL
  Filled 2018-11-16: qty 100

## 2018-11-16 MED ORDER — IPRATROPIUM-ALBUTEROL 0.5-2.5 (3) MG/3ML IN SOLN: 3.0000 mL | RESPIRATORY_TRACT | Status: DC | PRN

## 2018-11-16 MED ORDER — CHLORHEXIDINE GLUCONATE CLOTH 2 % EX PADS
6.0000 | MEDICATED_PAD | Freq: Every day | CUTANEOUS | Status: DC
  Administered 2018-11-17 – 2018-11-19 (×3): 6 via TOPICAL

## 2018-11-16 MED ORDER — CHLORHEXIDINE GLUCONATE CLOTH 2 % EX PADS
6.0000 | MEDICATED_PAD | Freq: Every day | CUTANEOUS | Status: DC
  Administered 2018-11-17 – 2018-11-19 (×3): 6 via TOPICAL

## 2018-11-16 MED ORDER — SODIUM CHLORIDE 0.9 % IV BOLUS
500.0000 mL | Freq: Once | INTRAVENOUS | Status: AC
  Administered 2018-11-16: 500 mL via INTRAVENOUS

## 2018-11-16 MED ORDER — MUPIROCIN 2 % EX OINT
1.0000 "application " | TOPICAL_OINTMENT | Freq: Two times a day (BID) | CUTANEOUS | Status: DC
  Administered 2018-11-16 – 2018-11-19 (×5): 1 via NASAL
  Filled 2018-11-16: qty 22

## 2018-11-16 NOTE — ED Notes (Signed)
Pt daughter updated on pt condition [name] [phone]

## 2018-11-16 NOTE — H&P (Addendum)
[facility] NAME: [name]    MR#:  [id number]  DATE OF BIRTH:  December 01, 1955  DATE OF ADMISSION:  11/15/2018  PRIMARY CARE PHYSICIAN: Donnie Coffin, MD   REQUESTING/REFERRING PHYSICIAN: Charlotte Crumb, MD  CHIEF COMPLAINT:   Chief Complaint  Patient presents with  . Chest Pain  . Shortness of Breath    HISTORY OF PRESENT ILLNESS:  [name]  is a 63 y.o. African-American male with a known history of multiple medical problems are mentioned below, who presented to the emergency room with acute onset of left-sided chest pain felt as pressure and graded 8/10 in severity with no nausea vomiting or diaphoresis, however with associated dyspnea.  He has been experiencing orthopnea and he denied any's worsening lower extremity edema.  No headache or dizziness or blurred vision.  No abdominal pain or melena or bright red bleeding per rectum.  No other bleeding diathesis.  Upon presentation to the emergency  room, respiratory rate was 26 and pulse 70 was 95% on 3 L of O2 by nasal cannula and temperature was 99.2 with blood pressure 133/86 and pulse was 88.  Labs revealed a potassium of 5.3 with a BUN of 62 and creatinine of 10.6.  BNP was 2274 and troponin I came back elevated at 0.96 with lactic acid of 1.  Portable chest ray revealed cardiomegaly with vascular congestion with mild interstitial diffuse edema and small pleural effusions.  There was focal airspace disease at the left greater than the right lung base that may reflect atelectasis or pneumonia.  EKG showed normal sinus rhythm with rate of 86 with left bundle branch block that is old.  The patient was given 25 mcg of IV fentanyl.  He will be admitted to a stepdown bed for further evaluation and management. PAST MEDICAL HISTORY:   Past Medical History:  Diagnosis Date  . 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 Amputee, below knee, left ([place])   . Anemia   .  Asthma   . Cardiomyopathy ([place])   . CHF (congestive heart failure) ([place])   . Chronic systolic heart failure ([place])   . Complication of anesthesia    hypotension  . COPD (chronic obstructive pulmonary disease) ([place])   . Coronary artery disease   . Dialysis patient ([place])    Mon, Wed, Fri  . End stage renal disease ([place])   . GERD (gastroesophageal reflux disease)   . Headache   . History of kidney stones   . History of pulmonary embolism   . HLD (hyperlipidemia)   . HTN (hypertension)   . Hyperparathyroidism   . Myocardial infarction ([place])   . Peripheral vascular disease ([place])   . Shortness of breath dyspnea   . Sleep apnea    NO C-PAP, Patient stated in process of  "getting one"   . Tobacco dependence     PAST SURGICAL HISTORY:   Past Surgical History:  Procedure Laterality Date  . A/V FISTULAGRAM Left 08/14/2017   Procedure: A/V FISTULAGRAM;  Surgeon: Algernon Huxley, MD;  Location: [facility];  Service: Cardiovascular;  Laterality: Left;  . AMPUTATION Left 05/06/2014   Procedure: AMPUTATION BELOW KNEE;  Surgeon: Elam Dutch, MD;  Location: [facility];  Service: Vascular;  Laterality: Left;  . AMPUTATION Right 01/12/2015   Procedure: Foot transmetatarsal amputation;  Surgeon: Algernon Huxley, MD;  Location: [facility];  Service: Vascular;  Laterality: Right;  .  APPLICATION OF WOUND VAC Right 03/01/2015   Procedure: Application of Bio-connekt graft and wound vac application to right foot ;  Surgeon: Algernon Huxley, MD;  Location: [facility];  Service: Vascular;  Laterality: Right;  . AV FISTULA PLACEMENT Left   . AV FISTULA PLACEMENT Left 11/28/2016   Procedure: ARTERIOVENOUS (AV) FISTULA CREATION;  Surgeon: Algernon Huxley, MD;  Location: [facility];  Service: Vascular;  Laterality: Left;  . CARDIAC CATHETERIZATION     stent placement   . CORONARY ANGIOPLASTY    . DIALYSIS/PERMA CATHETER INSERTION N/A 07/31/2016   Procedure: Dialysis/Perma Catheter Insertion;  Surgeon: Algernon Huxley, MD;   Location: [facility];  Service: Cardiovascular;  Laterality: N/A;  . DIALYSIS/PERMA CATHETER INSERTION N/A 07/25/2017   Procedure: DIALYSIS/PERMA CATHETER INSERTION and fistulagram;  Surgeon: Algernon Huxley, MD;  Location: [facility];  Service: Cardiovascular;  Laterality: N/A;  . DIALYSIS/PERMA CATHETER REMOVAL N/A 07/22/2017   Procedure: DIALYSIS/PERMA CATHETER REMOVAL;  Surgeon: Katha Cabal, MD;  Location: [facility];  Service: Cardiovascular;  Laterality: N/A;  . DIALYSIS/PERMA CATHETER REMOVAL N/A 02/05/2018   Procedure: DIALYSIS/PERMA CATHETER REMOVAL;  Surgeon: Algernon Huxley, MD;  Location: [facility];  Service: Cardiovascular;  Laterality: N/A;  . IR FLUORO GUIDE CV LINE LEFT  04/10/2017  . LEFT HEART CATH AND CORONARY ANGIOGRAPHY N/A 08/11/2018   Procedure: LEFT HEART CATH AND CORONARY ANGIOGRAPHY;  Surgeon: Corey Skains, MD;  Location: [facility];  Service: Cardiovascular;  Laterality: N/A;  . LIGATION OF ARTERIOVENOUS  FISTULA Right 01/31/2016   Procedure: LIGATION OF ARTERIOVENOUS  FISTULA;  Surgeon: Algernon Huxley, MD;  Location: [facility];  Service: Vascular;  Laterality: Right;  . PERIPHERAL VASCULAR CATHETERIZATION Right 12/15/2014   Procedure: Lower Extremity Angiography;  Surgeon: Algernon Huxley, MD;  Location: [facility];  Service: Cardiovascular;  Laterality: Right;  . PERIPHERAL VASCULAR CATHETERIZATION  12/15/2014   Procedure: Lower Extremity Intervention;  Surgeon: Algernon Huxley, MD;  Location: [facility];  Service: Cardiovascular;;  . PERIPHERAL VASCULAR CATHETERIZATION Right 08/14/2015   Procedure: A/V Shuntogram/Fistulagram;  Surgeon: Algernon Huxley, MD;  Location: [facility];  Service: Cardiovascular;  Laterality: Right;  . PERIPHERAL VASCULAR CATHETERIZATION N/A 08/14/2015   Procedure: A/V Shunt Intervention;  Surgeon: Algernon Huxley, MD;  Location: [facility];  Service: Cardiovascular;   Laterality: N/A;  . PERIPHERAL VASCULAR CATHETERIZATION N/A 01/11/2016   Procedure: Dialysis/Perma Catheter Insertion;  Surgeon: Algernon Huxley, MD;  Location: [facility];  Service: Cardiovascular;  Laterality: N/A;  . REVISON OF ARTERIOVENOUS FISTULA Right 02/17/2016   Procedure: removal of AV fistula;  Surgeon: Serafina Mitchell, MD;  Location: [facility];  Service: Vascular;  Laterality: Right;  . REVISON OF ARTERIOVENOUS FISTULA Right 01/31/2016   Procedure: REVISON OF ARTERIOVENOUS FISTULA ( BRACHIOCEPHALIC ) W/ ARTEGRAFT;  Surgeon: Algernon Huxley, MD;  Location: [facility];  Service: Vascular;  Laterality: Right;  . TRANSMETATARSAL AMPUTATION Right 05/04/2015   Procedure: TRANSMETATARSAL AMPUTATION REVISION, great toe amputation;  Surgeon: Algernon Huxley, MD;  Location: [facility];  Service: Vascular;  Laterality: Right;    SOCIAL HISTORY:   Social History   Tobacco Use  . Smoking status: Light Tobacco Smoker    Packs/day: 0.25    Years: 30.00    Pack years: 7.50    Types: Cigarettes  . Smokeless tobacco: Never Used  . Tobacco comment: 5  Substance Use Topics  . Alcohol use: No    Alcohol/week: 0.0 standard drinks    FAMILY HISTORY:   Family History  Problem Relation Age of Onset  . Leukemia Mother   . Heart attack Father   . Heart failure Other   . Hypertension Other   . Leukemia Other   . Diabetes Other   . Prostate cancer Neg Hx   . Kidney cancer Neg Hx   . Bladder Cancer Neg Hx     DRUG ALLERGIES:   Allergies  Allergen Reactions  . Dust Mite Extract Other (See Comments)    Reaction: unknown    REVIEW OF SYSTEMS:   ROS As per history of present illness. All pertinent systems were reviewed above. Constitutional,  HEENT, cardiovascular, respiratory, GI, GU, musculoskeletal, neuro, psychiatric, endocrine,  integumentary and hematologic systems were reviewed and are otherwise  negative/unremarkable except for positive findings mentioned above in the HPI.    MEDICATIONS AT HOME:   Prior to Admission medications   Medication Sig Start Date End Date Taking? Authorizing Provider  acetaminophen (TYLENOL) 500 MG tablet Take 500 mg by mouth every 6 (six) hours as needed for mild pain.     [provider]  albuterol (PROVENTIL HFA;VENTOLIN HFA) 108 (90 Base) MCG/ACT inhaler Inhale 2 puffs into the lungs every 6 (six) hours as needed for wheezing or shortness of breath.     [provider]  albuterol (PROVENTIL) (2.5 MG/3ML) 0.083% nebulizer solution Take 3 mLs (2.5 mg total) by nebulization every 6 (six) hours as needed for wheezing or shortness of breath. 04/24/18   Gladstone Lighter, MD  aspirin EC 325 MG tablet Take 325 mg by mouth daily.     [provider]  atorvastatin (LIPITOR) 10 MG tablet Take 10 mg by mouth at bedtime.     [provider]  budesonide-formoterol (SYMBICORT) 160-4.5 MCG/ACT inhaler Inhale 2 puffs into the lungs 2 (two) times daily.    [provider]  calcium acetate (PHOSLO) 667 MG capsule Take 667 mg by mouth daily.     [provider]  carvedilol (COREG) 6.25 MG tablet Take 1 tablet (6.25 mg total) by mouth 2 (two) times daily. 10/07/17   Alisa Graff, FNP  cetirizine (ZYRTEC) 10 MG tablet Take 10 mg by mouth daily.    [provider]  docusate sodium (COLACE) 100 MG capsule Take 100 mg by mouth daily as needed for mild constipation or moderate constipation.     [provider]  erythromycin ophthalmic ointment Place 1 application into the right eye at bedtime.    [provider]  fluticasone (FLONASE) 50 MCG/ACT nasal spray Place 1 spray into both nostrils daily.     [provider]  guaifenesin (ROBAFEN) 100 MG/5ML syrup Take 200 mg by mouth 3 (three) times daily as needed for cough.    [provider]  hydrocortisone 2.5 % cream Apply 1 application topically 2 (two) times daily as needed (itching).     [provider]   Ipratropium-Albuterol (COMBIVENT RESPIMAT) 20-100 MCG/ACT AERS respimat Inhale 1 puff into the lungs every 6 (six) hours as needed for wheezing or shortness of breath.    [provider]  isosorbide mononitrate (IMDUR) 30 MG 24 hr tablet Take 30 mg by mouth daily.  08/22/16   [provider]  midodrine (PROAMATINE) 10 MG tablet Take 10 mg by mouth every 8 (eight) hours.     [provider]  Multiple Vitamins-Minerals (THERA-M  PO) Take 1 tablet by mouth 2 (two) times daily.    [provider]  nitroGLYCERIN (NITROSTAT) 0.4 MG SL tablet Place 0.4 mg under the tongue every 5 (five) minutes as needed for chest pain.    [provider]  pantoprazole (PROTONIX) 40 MG tablet Take 1 tablet (40 mg total) by mouth daily. 08/18/18   Hillary Bow, MD  polyethylene glycol (MIRALAX / GLYCOLAX) packet Take 17 g by mouth daily as needed for mild constipation.     [provider]  pregabalin (LYRICA) 75 MG capsule Take 1 capsule (75 mg total) by mouth daily. 01/22/17   Gladstone Lighter, MD  sevelamer carbonate (RENVELA) 800 MG tablet Take 800 mg by mouth 3 (three) times daily with meals.     [provider]  tiotropium (SPIRIVA) 18 MCG inhalation capsule Place 18 mcg into inhaler and inhale daily.    [provider]  traMADol (ULTRAM) 50 MG tablet Take 75 mg by mouth every 8 (eight) hours as needed (pain).    [provider]      VITAL SIGNS:  Blood pressure (!) 150/83, pulse 90, temperature 99.2 F (37.3 C), temperature source Oral, resp. rate 19, height 6\' 2"  (1.88 m), weight 96 kg, SpO2 96 %.  PHYSICAL EXAMINATION:  Physical Exam  GENERAL:  63 y.o.-year-old African-American patient lying in the bed with no acute distress.  EYES: Pupils equal, round, reactive to light and accommodation. No scleral icterus. Extraocular muscles intact.  HEENT: Head atraumatic, normocephalic. Oropharynx and nasopharynx clear.  NECK:  Supple, no  jugular venous distention. No thyroid enlargement, no tenderness.  LUNGS: Normal breath sounds bilaterally, no wheezing, rales,rhonchi or crepitation. No use of accessory muscles of respiration.  CARDIOVASCULAR: Regular rate and rhythm, S1, S2 normal. No murmurs, rubs, or gallops.  ABDOMEN: Soft, nondistended, nontender. Bowel sounds present. No organomegaly or mass.  EXTREMITIES: The patient has left below-knee amputation and right midfoot amputation.  He has trace right pitting edema.   NEUROLOGIC: Cranial nerves II through XII are intact. Muscle strength 5/5 in all extremities. Sensation intact. Gait not checked.  PSYCHIATRIC: The patient is alert and oriented x 3.  Normal affect and good eye contact. SKIN: No obvious rash, lesion, or ulcer.   LABORATORY PANEL:   CBC Recent Labs  Lab 11/15/18 2117  WBC 9.5  HGB 14.5  HCT 45.8  PLT 142*   ------------------------------------------------------------------------------------------------------------------  Chemistries  Recent Labs  Lab 11/15/18 2117  NA 143  K 5.3*  CL 101  CO2 27  GLUCOSE 121*  BUN 62*  CREATININE 10.60*  CALCIUM 10.1  AST 34  ALT 34  ALKPHOS 139*  BILITOT 0.6   ------------------------------------------------------------------------------------------------------------------  Cardiac Enzymes Recent Labs  Lab 11/15/18 2117  TROPONINI 0.96*   ------------------------------------------------------------------------------------------------------------------  RADIOLOGY:  Dg Chest Port 1 View  Result Date: 11/15/2018 CLINICAL DATA:  Left-sided chest pain EXAM: PORTABLE CHEST 1 VIEW COMPARISON:  09/20/2018, 08/12/2018, 04/24/2018 FINDINGS: Moderate-to-marked cardiomegaly, chronic. Small bilateral pleural effusions. Vascular congestion with mild interstitial edema. Increased focal airspace disease at the left greater than right lung base. Aortic atherosclerosis. No pneumothorax. Vascular stent in the right  subclavian region. IMPRESSION: 1. Cardiomegaly with vascular congestion, mild diffuse interstitial edema and small pleural effusions. 2. Focal airspace disease at the left greater than right lung base may reflect atelectasis or pneumonia. Electronically Signed   By: Donavan Foil M.D.   On: 11/15/2018 21:42      IMPRESSION AND PLAN:   1.  Non-STEMI.  Patient will be admitted to a stepdown unit.  He will be placed on aspirin and IV heparin.  We will continue aspirin and statin therapy as well as beta-blocker therapy.  He will be placed on PRN IV morphine sulfate and sublingual nitroglycerin.  With stabilize blood pressure Nitropaste can be added.  We will hold off Symbicort.  2D echo and a cardiology consultation will be obtained in a.m.  Dr. Saralyn Pilar was notified about the patient.  I notified the the e- link intensivist about the admission.  2.  Acute on top of chronic systolic CHF.  Most recent EF was 20 to 25% in March of this year with mildly dilated left and right atria with trivial aortic regurgitation.  The patient will be diuresed with IV Lasix.  Nephrology consultation will be obtained for hemodialysis.  3.  End-stage renal disease on hemodialysis.  Nephrology consult will be obtained by Dr. Juleen China who was notified about the patient.  We will continue his Renvela.  4.  Dyslipidemia.  Statin therapy will be resumed.  5.  GERD.  PPI therapy will be resumed.  6.  COPD.  We will continue Spiriva and place the patient on PRN duo nebs.  We will hold off Symbicort.  7.  DVT prophylaxis.  The patient will be on IV heparin for his ACS.  All the records are reviewed and case discussed with ED provider. The plan of care was discussed in details with the patient (and family). I answered all questions. The patient agreed to proceed with the above mentioned plan. Further management will depend upon hospital course.   CODE STATUS: This was discussed with the patient and he desires to be full  code  TOTAL TIME TAKING CARE OF THIS PATIENT: 50 minutes.    Christel Mormon M.D on 11/16/2018 at 12:02 AM  Pager - [phone]  After 6pm go to [email or web address] - [employer] [facility]  Office  [phone]  CC: Primary care physician; Donnie Coffin, MD   Note: This dictation was prepared with Dragon dictation along with smaller phrase technology. Any transcriptional errors that result from this process are unintentional.

## 2018-11-16 NOTE — ED Notes (Addendum)
Pt BP noted to be in the 60's - repositioned cuff BP remained in 60's - repositioned cuff and pt BP in the 70's - admitting provider messaged (awaiting response) and ED provider Dr Corky Downs notified with no new orders - also requested secretary to page admitting provider and notified Nira Conn charge RN

## 2018-11-16 NOTE — ED Notes (Signed)
Secretary paging admitting provider again

## 2018-11-16 NOTE — ED Notes (Signed)
Daughter called and given an update.

## 2018-11-16 NOTE — ED Notes (Signed)
Resting quietly, easily aroused for lab draw.  Voices no complaints at this time.

## 2018-11-16 NOTE — Consult Note (Signed)
Reason for Consult: Congestive heart failure shortness of breath Referring Physician: Dr. Sidney Ace hospitalist  [name] is an 63 y.o. male.  HPI: Patient presents with shortness of breath dyspnea congestive heart failure symptoms denies any fever chills or sweats .  Patient has significant dyspnea has history of end-stage renal disease peripheral vascular disease status post amputation patient is complained of recent congestion shortness of breath dyspnea.  Patient had borderline troponins as well with vague chest pain so was admitted for further assessment and evaluation  Past Medical History:  Diagnosis Date  . 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 Amputee, below knee, left ([place])   . Anemia   . Asthma   . Cardiomyopathy ([place])   . CHF (congestive heart failure) ([place])   . Chronic systolic heart failure ([place])   . Complication of anesthesia    hypotension  . COPD (chronic obstructive pulmonary disease) ([place])   . Coronary artery disease   . Dialysis patient ([place])    Mon, Wed, Fri  . End stage renal disease ([place])   . GERD (gastroesophageal reflux disease)   . Headache   . History of kidney stones   . History of pulmonary embolism   . HLD (hyperlipidemia)   . HTN (hypertension)   . Hyperparathyroidism   . Myocardial infarction ([place])   . Peripheral vascular disease ([place])   . Shortness of breath dyspnea   . Sleep apnea    NO C-PAP, Patient stated in process of  "getting one"   . Tobacco dependence     Past Surgical History:  Procedure Laterality Date  . A/V FISTULAGRAM Left 08/14/2017   Procedure: A/V FISTULAGRAM;  Surgeon: Algernon Huxley, MD;  Location: [facility];  Service: Cardiovascular;  Laterality: Left;  . AMPUTATION Left 05/06/2014   Procedure: AMPUTATION BELOW KNEE;  Surgeon: Elam Dutch, MD;  Location: [facility];  Service: Vascular;  Laterality: Left;  . AMPUTATION Right 01/12/2015   Procedure: Foot transmetatarsal amputation;  Surgeon: Algernon Huxley, MD;  Location: [facility];  Service: Vascular;  Laterality: Right;  . APPLICATION OF WOUND VAC Right 03/01/2015   Procedure: Application of Bio-connekt graft and wound vac application to right foot ;  Surgeon: Algernon Huxley, MD;  Location: [facility];  Service: Vascular;  Laterality: Right;  . AV FISTULA PLACEMENT Left   . AV FISTULA PLACEMENT Left 11/28/2016   Procedure: ARTERIOVENOUS (AV) FISTULA CREATION;  Surgeon: Algernon Huxley, MD;  Location: [facility];  Service: Vascular;  Laterality: Left;  . CARDIAC CATHETERIZATION     stent placement   . CORONARY ANGIOPLASTY    . DIALYSIS/PERMA CATHETER INSERTION N/A 07/31/2016   Procedure: Dialysis/Perma Catheter Insertion;  Surgeon: Algernon Huxley, MD;  Location: [facility];  Service: Cardiovascular;  Laterality: N/A;  . DIALYSIS/PERMA CATHETER INSERTION N/A 07/25/2017   Procedure: DIALYSIS/PERMA CATHETER INSERTION and fistulagram;  Surgeon: Algernon Huxley, MD;  Location: [facility];  Service: Cardiovascular;  Laterality: N/A;  . DIALYSIS/PERMA CATHETER REMOVAL N/A 07/22/2017   Procedure: DIALYSIS/PERMA CATHETER REMOVAL;  Surgeon: Katha Cabal, MD;  Location: [facility];  Service: Cardiovascular;  Laterality: N/A;  . DIALYSIS/PERMA CATHETER REMOVAL N/A 02/05/2018   Procedure: DIALYSIS/PERMA CATHETER REMOVAL;  Surgeon: Algernon Huxley, MD;  Location: [facility];  Service: Cardiovascular;  Laterality: N/A;  . IR FLUORO GUIDE CV LINE LEFT  04/10/2017  . LEFT HEART CATH AND CORONARY ANGIOGRAPHY  N/A 08/11/2018   Procedure: LEFT HEART CATH AND CORONARY ANGIOGRAPHY;  Surgeon: Corey Skains, MD;  Location: [facility];  Service: Cardiovascular;  Laterality: N/A;  . LIGATION OF ARTERIOVENOUS  FISTULA Right 01/31/2016   Procedure: LIGATION OF ARTERIOVENOUS  FISTULA;  Surgeon: Algernon Huxley, MD;  Location: [facility];  Service: Vascular;  Laterality: Right;  . PERIPHERAL VASCULAR CATHETERIZATION Right 12/15/2014   Procedure:  Lower Extremity Angiography;  Surgeon: Algernon Huxley, MD;  Location: [facility];  Service: Cardiovascular;  Laterality: Right;  . PERIPHERAL VASCULAR CATHETERIZATION  12/15/2014   Procedure: Lower Extremity Intervention;  Surgeon: Algernon Huxley, MD;  Location: [facility];  Service: Cardiovascular;;  . PERIPHERAL VASCULAR CATHETERIZATION Right 08/14/2015   Procedure: A/V Shuntogram/Fistulagram;  Surgeon: Algernon Huxley, MD;  Location: [facility];  Service: Cardiovascular;  Laterality: Right;  . PERIPHERAL VASCULAR CATHETERIZATION N/A 08/14/2015   Procedure: A/V Shunt Intervention;  Surgeon: Algernon Huxley, MD;  Location: [facility];  Service: Cardiovascular;  Laterality: N/A;  . PERIPHERAL VASCULAR CATHETERIZATION N/A 01/11/2016   Procedure: Dialysis/Perma Catheter Insertion;  Surgeon: Algernon Huxley, MD;  Location: [facility];  Service: Cardiovascular;  Laterality: N/A;  . REVISON OF ARTERIOVENOUS FISTULA Right 02/17/2016   Procedure: removal of AV fistula;  Surgeon: Serafina Mitchell, MD;  Location: [facility];  Service: Vascular;  Laterality: Right;  . REVISON OF ARTERIOVENOUS FISTULA Right 01/31/2016   Procedure: REVISON OF ARTERIOVENOUS FISTULA ( BRACHIOCEPHALIC ) W/ ARTEGRAFT;  Surgeon: Algernon Huxley, MD;  Location: [facility];  Service: Vascular;  Laterality: Right;  . TRANSMETATARSAL AMPUTATION Right 05/04/2015   Procedure: TRANSMETATARSAL AMPUTATION REVISION, great toe amputation;  Surgeon: Algernon Huxley, MD;  Location: [facility];  Service: Vascular;  Laterality: Right;    Family History  Problem Relation Age of Onset  . Leukemia Mother   . Heart attack Father   . Heart failure Other   . Hypertension Other   . Leukemia Other   . Diabetes Other   . Prostate cancer Neg Hx   . Kidney cancer Neg Hx   . Bladder Cancer Neg Hx     Social History:  reports that he has been smoking cigarettes. He has a 7.50 pack-year smoking history. He has never used smokeless tobacco.  He reports that he does not drink alcohol or use drugs.  Allergies:  Allergies  Allergen Reactions  . Dust Mite Extract Other (See Comments)    Reaction: unknown    Medications: I have reviewed the patient's current medications.  Results for orders placed or performed during the hospital encounter of 11/15/18 (from the past 48 hour(s))  Comprehensive metabolic panel     Status: Abnormal   Collection Time: 11/15/18  9:17 PM  Result Value Ref Range   Sodium 143 135 - 145 mmol/L   Potassium 5.3 (H) 3.5 - 5.1 mmol/L   Chloride 101 98 - 111 mmol/L   CO2 27 22 - 32 mmol/L   Glucose, Bld 121 (H) 70 - 99 mg/dL   BUN 62 (H) 8 - 23 mg/dL   Creatinine, Ser 10.60 (H) 0.61 - 1.24 mg/dL   Calcium 10.1 8.9 - 10.3 mg/dL   Total Protein 8.3 (H) 6.5 - 8.1 g/dL   Albumin 3.5 3.5 - 5.0 g/dL   AST 34 15 - 41 U/L   ALT 34 0 - 44 U/L   Alkaline Phosphatase 139 (H) 38 - 126 U/L  Total Bilirubin 0.6 0.3 - 1.2 mg/dL   GFR calc non Af Amer 5 (L) >60 mL/min   GFR calc Af Amer 5 (L) >60 mL/min   Anion gap 15 5 - 15    Comment: Performed at [facility], [address] [place], [place] [address]  Troponin I - Once     Status: Abnormal   Collection Time: 11/15/18  9:17 PM  Result Value Ref Range   Troponin I 0.96 (HH) <0.03 ng/mL    Comment: CRITICAL RESULT CALLED TO, READ BACK BY AND VERIFIED WITH DAWN [facility] AT 2201 ON 11/15/2018 JJB Performed at [facility], [address] [place], [place] [address]   CBC with Differential     Status: Abnormal   Collection Time: 11/15/18  9:17 PM  Result Value Ref Range   WBC 9.5 4.0 - 10.5 K/uL   RBC 5.03 4.22 - 5.81 MIL/uL   Hemoglobin 14.5 13.0 - 17.0 g/dL   HCT 45.8 39.0 - 52.0 %   MCV 91.1 80.0 - 100.0 fL   MCH 28.8 26.0 - 34.0 pg   MCHC 31.7 30.0 - 36.0 g/dL   RDW 17.1 (H) 11.5 - 15.5 %   Platelets 142 (L) 150 - 400 K/uL    Comment: REPEATED TO VERIFY SPECIMEN CHECKED FOR CLOTS    nRBC 0.0 0.0 - 0.2 %   Neutrophils Relative  % 63 %   Neutro Abs 6.0 1.7 - 7.7 K/uL   Lymphocytes Relative 16 %   Lymphs Abs 1.5 0.7 - 4.0 K/uL   Monocytes Relative 19 %   Monocytes Absolute 1.8 (H) 0.1 - 1.0 K/uL   Eosinophils Relative 2 %   Eosinophils Absolute 0.1 0.0 - 0.5 K/uL   Basophils Relative 0 %   Basophils Absolute 0.0 0.0 - 0.1 K/uL   Immature Granulocytes 0 %   Abs Immature Granulocytes 0.04 0.00 - 0.07 K/uL    Comment: Performed at [facility], [address] [place], [place] [address]  Protime-INR     Status: None   Collection Time: 11/15/18  9:17 PM  Result Value Ref Range   Prothrombin Time 13.0 11.4 - 15.2 seconds   INR 1.0 0.8 - 1.2    Comment: (NOTE) INR goal varies based on device and disease states. Performed at [facility], [address] [place], [place] [address]   APTT     Status: None   Collection Time: 11/15/18  9:17 PM  Result Value Ref Range   aPTT 31 24 - 36 seconds    Comment: Performed at [facility], [address] [place], [place] [address]  Brain natriuretic peptide     Status: Abnormal   Collection Time: 11/15/18  9:18 PM  Result Value Ref Range   B Natriuretic Peptide 2,274.0 (H) 0.0 - 100.0 pg/mL    Comment: Performed at [facility], [address] [place], [place] [address]  Lactic acid, plasma     Status: None   Collection Time: 11/15/18  9:18 PM  Result Value Ref Range   Lactic Acid, Venous 1.0 0.5 - 1.9 mmol/L    Comment: Performed at [facility], [address] [place], [place] [address]  Blood culture (routine x 2)     Status: None (Preliminary result)   Collection Time: 11/15/18  9:18 PM   Specimen: BLOOD  Result Value Ref Range   Specimen Description BLOOD RIGHT ANTECUBITAL    Special Requests  BOTTLES DRAWN AEROBIC AND ANAEROBIC Blood Culture adequate volume   Culture      NO GROWTH < 12 HOURS Performed at [facility], [address] [place], [place] [address]    Report Status PENDING    Blood culture (routine x 2)     Status: None (Preliminary result)   Collection Time: 11/15/18  9:19 PM   Specimen: BLOOD  Result Value Ref Range   Specimen Description BLOOD BLOOD RIGHT HAND    Special Requests      BOTTLES DRAWN AEROBIC AND ANAEROBIC Blood Culture adequate volume   Culture      NO GROWTH < 12 HOURS Performed at [facility], [address] [place], [place] [address]    Report Status PENDING   SARS Coronavirus 2 (CEPHEID - Performed in [place] hospital lab), Hosp Order     Status: None   Collection Time: 11/15/18  9:35 PM   Specimen: Nasopharyngeal Swab  Result Value Ref Range   SARS Coronavirus 2 NEGATIVE NEGATIVE    Comment: (NOTE) If result is NEGATIVE SARS-CoV-2 target nucleic acids are NOT DETECTED. The SARS-CoV-2 RNA is generally detectable in upper and lower  respiratory specimens during the acute phase of infection. The lowest  concentration of SARS-CoV-2 viral copies this assay can detect is 250  copies / mL. A negative result does not preclude SARS-CoV-2 infection  and should not be used as the sole basis for treatment or other  patient management decisions.  A negative result may occur with  improper specimen collection / handling, submission of specimen other  than nasopharyngeal swab, presence of viral mutation(s) within the  areas targeted by this assay, and inadequate number of viral copies  (<250 copies / mL). A negative result must be combined with clinical  observations, patient history, and epidemiological information. If result is POSITIVE SARS-CoV-2 target nucleic acids are DETECTED. The SARS-CoV-2 RNA is generally detectable in upper and lower  respiratory specimens dur ing the acute phase of infection.  Positive  results are indicative of active infection with SARS-CoV-2.  Clinical  correlation with patient history and other diagnostic information is  necessary to determine patient infection status.  Positive results do   not rule out bacterial infection or co-infection with other viruses. If result is PRESUMPTIVE POSTIVE SARS-CoV-2 nucleic acids MAY BE PRESENT.   A presumptive positive result was obtained on the submitted specimen  and confirmed on repeat testing.  While 2019 novel coronavirus  (SARS-CoV-2) nucleic acids may be present in the submitted sample  additional confirmatory testing may be necessary for epidemiological  and / or clinical management purposes  to differentiate between  SARS-CoV-2 and other Sarbecovirus currently known to infect humans.  If clinically indicated additional testing with an alternate test  methodology [phone]) is advised. The SARS-CoV-2 RNA is generally  detectable in upper and lower respiratory sp ecimens during the acute  phase of infection. The expected result is Negative. Fact Sheet for Patients:  StrictlyIdeas.no Fact Sheet for Healthcare Providers: BankingDealers.co.za This test is not yet approved or cleared by the [place] FDA and has been authorized for detection and/or diagnosis of SARS-CoV-2 by FDA under an Emergency Use Authorization (EUA).  This EUA will remain in effect (meaning this test can be used) for the duration of the COVID-19 declaration under Section 564(b)(1) of the Act, 21 U.S.C. section 360bbb-3(b)(1), unless the authorization is terminated or revoked sooner. Performed at [facility], [address], [place], [place] [address]  Basic metabolic panel     Status: Abnormal   Collection Time: 11/16/18  5:37 AM  Result Value Ref Range   Sodium 140 135 - 145 mmol/L   Potassium 5.2 (H) 3.5 - 5.1 mmol/L   Chloride 100 98 - 111 mmol/L   CO2 26 22 - 32 mmol/L   Glucose, Bld 110 (H) 70 - 99 mg/dL   BUN 67 (H) 8 - 23 mg/dL   Creatinine, Ser 11.17 (H) 0.61 - 1.24 mg/dL   Calcium 9.6 8.9 - 10.3 mg/dL   GFR calc non Af Amer 4 (L) >60 mL/min   GFR calc Af Amer 5 (L) >60 mL/min    Anion gap 14 5 - 15    Comment: Performed at [facility], [address] [place], [place] [address]  Lipid panel     Status: Abnormal   Collection Time: 11/16/18  5:37 AM  Result Value Ref Range   Cholesterol 132 0 - 200 mg/dL   Triglycerides 150 (H) <150 mg/dL   HDL 45 >40 mg/dL   Total CHOL/HDL Ratio 2.9 RATIO   VLDL 30 0 - 40 mg/dL   LDL Cholesterol 57 0 - 99 mg/dL    Comment:        Total Cholesterol/HDL:CHD Risk Coronary Heart Disease Risk Table                     Men   Women  1/2 Average Risk   3.4   3.3  Average Risk       5.0   4.4  2 X Average Risk   9.6   7.1  3 X Average Risk  23.4   11.0        Use the calculated Patient Ratio above and the CHD Risk Table to determine the patient's CHD Risk.        ATP III CLASSIFICATION (LDL):  <100     mg/dL   Optimal  100-129  mg/dL   Near or Above                    Optimal  130-159  mg/dL   Borderline  160-189  mg/dL   High  >190     mg/dL   Very High Performed at [facility], [address] [place], [place] [address]   CBC     Status: Abnormal   Collection Time: 11/16/18  5:37 AM  Result Value Ref Range   WBC 10.3 4.0 - 10.5 K/uL   RBC 4.94 4.22 - 5.81 MIL/uL   Hemoglobin 14.3 13.0 - 17.0 g/dL   HCT 44.4 39.0 - 52.0 %   MCV 89.9 80.0 - 100.0 fL   MCH 28.9 26.0 - 34.0 pg   MCHC 32.2 30.0 - 36.0 g/dL   RDW 16.9 (H) 11.5 - 15.5 %   Platelets 146 (L) 150 - 400 K/uL   nRBC 0.0 0.0 - 0.2 %    Comment: Performed at [facility], [address] [place], [place] [address]  Heparin level (unfractionated)     Status: None   Collection Time: 11/16/18  5:37 AM  Result Value Ref Range   Heparin Unfractionated 0.35 0.30 - 0.70 IU/mL    Comment: (NOTE) If heparin results are below expected values, and patient dosage has  been confirmed, suggest follow up testing of antithrombin III levels. Performed at [facility], [address], [place], [place] [address]   Glucose, capillary  Status: None   Collection Time: 11/16/18  9:31 AM  Result Value Ref Range   Glucose-Capillary 86 70 - 99 mg/dL  MRSA PCR Screening     Status: Abnormal   Collection Time: 11/16/18  9:39 AM   Specimen: Nasopharyngeal  Result Value Ref Range   MRSA by PCR POSITIVE (A) NEGATIVE    Comment:        The GeneXpert MRSA Assay (FDA approved for NASAL specimens only), is one component of a comprehensive MRSA colonization surveillance program. It is not intended to diagnose MRSA infection nor to guide or monitor treatment for MRSA infections. RESULT CALLED TO, READ BACK BY AND VERIFIED WITH: JAMIE DEEM @1150  11/16/18 AKT Performed at [facility], [address] [place], [place] [address]   Lactic acid, plasma     Status: None   Collection Time: 11/16/18  4:39 PM  Result Value Ref Range   Lactic Acid, Venous 1.0 0.5 - 1.9 mmol/L    Comment: Performed at [facility], [place], [place] [address]  Heparin level (unfractionated)     Status: None   Collection Time: 11/16/18  4:39 PM  Result Value Ref Range   Heparin Unfractionated 0.51 0.30 - 0.70 IU/mL    Comment: (NOTE) If heparin results are below expected values, and patient dosage has  been confirmed, suggest follow up testing of antithrombin III levels. Performed at [facility], [address] [place], [place] [address]   Troponin I - Add-On to previous collection     Status: Abnormal   Collection Time: 11/16/18  4:39 PM  Result Value Ref Range   Troponin I 8.28 (HH) <0.03 ng/mL    Comment: CRITICAL RESULT CALLED TO, READ BACK BY AND VERIFIED WITH JAMIE DEEM AT 1714 11/16/2018.  TFK Performed at [facility], [address] [place], [place] [address]   Phosphorus     Status: None   Collection Time: 11/16/18  4:39 PM  Result Value Ref Range   Phosphorus 4.6 2.5 - 4.6 mg/dL    Comment: Performed at [facility], [address] [place], [place] [address]     Dg Chest Port 1 View  Result Date: 11/15/2018 CLINICAL DATA:  Left-sided chest pain EXAM: PORTABLE CHEST 1 VIEW COMPARISON:  09/20/2018, 08/12/2018, 04/24/2018 FINDINGS: Moderate-to-marked cardiomegaly, chronic. Small bilateral pleural effusions. Vascular congestion with mild interstitial edema. Increased focal airspace disease at the left greater than right lung base. Aortic atherosclerosis. No pneumothorax. Vascular stent in the right subclavian region. IMPRESSION: 1. Cardiomegaly with vascular congestion, mild diffuse interstitial edema and small pleural effusions. 2. Focal airspace disease at the left greater than right lung base may reflect atelectasis or pneumonia. Electronically Signed   By: Donavan Foil M.D.   On: 11/15/2018 21:42    Review of Systems  Constitutional: Positive for diaphoresis and malaise/fatigue.  HENT: Positive for congestion.   Eyes: Negative.   Respiratory: Positive for shortness of breath.   Cardiovascular: Positive for chest pain, orthopnea, leg swelling and PND.  Gastrointestinal: Negative.   Genitourinary: Negative.   Musculoskeletal: Negative.   Skin: Negative.   Neurological: Positive for weakness.  Endo/Heme/Allergies: Negative.   Psychiatric/Behavioral: Negative.    Blood pressure (!) 89/65, pulse 74, temperature 98.1 F (36.7 C), temperature source Oral, resp. rate (!) 23, height 6\' 2"  (1.88 m), weight 95.9 kg, SpO2 94 %. Physical Exam  Nursing note and vitals reviewed. Constitutional: He is oriented to person, place, and time. He appears  well-developed and well-nourished.  HENT:  Head: Normocephalic and atraumatic.  Eyes: Pupils are equal, round, and reactive to light. Conjunctivae and EOM are normal.  Neck: Normal range of motion. Neck supple.  Cardiovascular: Normal rate and regular rhythm. Exam reveals gallop.  Murmur heard. Respiratory: Effort normal and breath sounds normal.  GI: Soft. Bowel sounds are normal.  Musculoskeletal:         General: Edema present.  Neurological: He is alert and oriented to person, place, and time. He has normal reflexes.  Skin: Skin is warm and dry.  Psychiatric: He has a normal mood and affect.    Assessment/Plan: Shortness of breath Congestive heart failure Left-sided chest pain Hypertension COPD Coronary artery disease GERD End-stage renal disease on dialysis Obstructive sleep apnea Peripheral vascular disease History of amputation History of tobacco abuse . Plan Agree with admit to ICU Continue respiratory support Agree with dialysis for volume overload Agree with CPAP for obstructive sleep apnea Supplemental oxygen as necessary for shortness of breath Echocardiogram for assessment of left ventricular function Continue aggressive medical therapy      Dwayne D Callwood 11/16/2018, 10:20 PM

## 2018-11-16 NOTE — Progress Notes (Signed)
Post HD Assessment  578mL fluid removal, pt stable post tx.   11/16/18 1912  Neurological  Level of Consciousness Alert  Orientation Level Oriented X4  Respiratory  Respiratory Pattern Regular  Chest Assessment Chest expansion symmetrical  Cardiac  Pulse Regular  ECG Monitor Yes  Cardiac Rhythm NSR  Vascular  R Radial Pulse +2  L Radial Pulse +2  Edema Generalized  Generalized Edema +1  Integumentary  Integumentary (WDL) X  Skin Color Appropriate for ethnicity  Skin Condition Dry;Flaky  Skin Integrity Amputation  Musculoskeletal  Musculoskeletal (WDL) X  Generalized Weakness Yes  GU Assessment  Genitourinary (WDL) X  Genitourinary Symptoms Anuria (HD pt)  Psychosocial  Psychosocial (WDL) WDL

## 2018-11-16 NOTE — ED Notes (Signed)
Report given to [facility] in ICU

## 2018-11-16 NOTE — Progress Notes (Signed)
Pre HD Assessment    11/16/18 1600  Neurological  Level of Consciousness Alert  Orientation Level Oriented X4  Respiratory  Respiratory Pattern Regular  Chest Assessment Chest expansion symmetrical  Cardiac  Pulse Regular  ECG Monitor Yes  Cardiac Rhythm NSR  Vascular  R Radial Pulse +2  L Radial Pulse +2  Edema Generalized  Generalized Edema +1  Integumentary  Integumentary (WDL) X  Skin Color Appropriate for ethnicity  Skin Condition Dry;Flaky  Skin Integrity Amputation  Additional Integumentary Comments  (HD pt)  Musculoskeletal  Musculoskeletal (WDL) X  Generalized Weakness Yes  GU Assessment  Genitourinary (WDL) X  Genitourinary Symptoms Anuria (HD pt)  Psychosocial  Psychosocial (WDL) WDL

## 2018-11-16 NOTE — Progress Notes (Signed)
HD Tx Start   11/16/18 1600  Vital Signs  Pulse Rate 78  Resp 19  BP (!) 106/45  Oxygen Therapy  SpO2 98 %  During Hemodialysis Assessment  Blood Flow Rate (mL/min) 400 mL/min  Arterial Pressure (mmHg) -180 mmHg  Venous Pressure (mmHg) 220 mmHg  Transmembrane Pressure (mmHg) 40 mmHg  Ultrafiltration Rate (mL/min) 330 mL/min (330 mL removal per HOUR)  Dialysate Flow Rate (mL/min) 800 ml/min  Conductivity: Machine  14  HD Safety Checks Performed Yes  Dialysis Fluid Bolus Normal Saline  Bolus Amount (mL) 250 mL  Intra-Hemodialysis Comments Tx initiated

## 2018-11-16 NOTE — ED Notes (Signed)
Pt repositioned and BP improved to 111/63

## 2018-11-16 NOTE — Progress Notes (Signed)
I was called by ER for hypotension.  I went and saw the pt, alert, oriented, c/o weakness and left sided chest pain.  BP 66/40, HR 90, SPO2 94% on 2 ltr O2,  On exam- creps on b/l lungs. s1s2 regular.  Assessment and plan  NSTEMI,  Hypotension Ac on ch systolic CHF ( EF 60%) ESRD on HD   For now will give Bolus NS 500 ml over 2 hrs. Already on Heparin drip. He had a assigned bed on 2A, changed to stepdown. I informed Dr. Saralyn Pilar and Dr. Alva Garnet about the critical condition.  Detailed note to follow,. Critical care time spent 35 min.

## 2018-11-16 NOTE — Progress Notes (Signed)
*  PRELIMINARY RESULTS* Echocardiogram 2D Echocardiogram has been performed.  Wallie Char Honour Schwieger 11/16/2018, 10:50 AM

## 2018-11-16 NOTE — ED Notes (Signed)
Patient repositioned for comfort.

## 2018-11-16 NOTE — Progress Notes (Signed)
[facility], [place] 11/16/18  Subjective:   LOS: 1 No intake/output data recorded. Patient presented to the emergency room from the [facility] via EMS for left-sided chest pain which has been going on for 2 to 3 days.  He reports radiation down left arm, shoulder, left lower chest.  Denies any nausea, vomiting, diarrhea.  Chest x-ray showed cardiomegaly with vascular congestion, mild interstitial diffuse edema and small pleural effusions.  Suspicious for pneumonia.  His blood pressure has been low Patient normally dialyzes at [facility] MWF  Objective:  Vital signs in last 24 hours:  Temp:  [99.2 F (37.3 C)] 99.2 F (37.3 C) (06/21 1952) Pulse Rate:  [87-96] 95 (06/22 0745) Resp:  [19-29] 24 (06/22 0900) BP: (54-150)/(12-96) 80/59 (06/22 0900) SpO2:  [95 %-98 %] 98 % (06/22 0745) Weight:  [96 kg] 96 kg (06/21 1955)  Weight change:  Filed Weights   11/15/18 1955  Weight: 96 kg    Intake/Output:   No intake or output data in the 24 hours ending 11/16/18 1120   Physical Exam: General:  Chronically ill appearing gentleman, laying in the bed  HEENT  anicteric, moist oral mucous membranes  Neck  supple, no masses  Pulm/lungs  mild basilar crackles bilaterally  CVS/Heart  no rub  Abdomen:   Soft, nontender  Extremities:  Left BKA, 1+ edema on the right  Neurologic:  Alert, oriented  Skin:  No acute rashes  Access:  Left arm AV fistula       Basic Metabolic Panel:  Recent Labs  Lab 11/15/18 2117 11/16/18 0537  NA 143 140  K 5.3* 5.2*  CL 101 100  CO2 27 26  GLUCOSE 121* 110*  BUN 62* 67*  CREATININE 10.60* 11.17*  CALCIUM 10.1 9.6     CBC: Recent Labs  Lab 11/15/18 2117 11/16/18 0537  WBC 9.5 10.3  NEUTROABS 6.0  --   HGB 14.5 14.3  HCT 45.8 44.4  MCV 91.1 89.9  PLT 142* 146*      Lab Results  Component Value Date   HEPBSAG Negative 09/18/2017   HEPBSAB Non Reactive 05/22/2016       Microbiology:  Recent Results (from the past 240 hour(s))  Blood culture (routine x 2)     Status: None (Preliminary result)   Collection Time: 11/15/18  9:18 PM   Specimen: BLOOD  Result Value Ref Range Status   Specimen Description BLOOD RIGHT ANTECUBITAL  Final   Special Requests   Final    BOTTLES DRAWN AEROBIC AND ANAEROBIC Blood Culture adequate volume   Culture   Final    NO GROWTH < 12 HOURS Performed at [facility], [address] [place], [place] [address]    Report Status PENDING  Incomplete  Blood culture (routine x 2)     Status: None (Preliminary result)   Collection Time: 11/15/18  9:19 PM   Specimen: BLOOD  Result Value Ref Range Status   Specimen Description BLOOD BLOOD RIGHT HAND  Final   Special Requests   Final    BOTTLES DRAWN AEROBIC AND ANAEROBIC Blood Culture adequate volume   Culture   Final    NO GROWTH < 12 HOURS Performed at [facility], [address], [place], [place] [address]    Report Status PENDING  Incomplete  SARS Coronavirus 2 (CEPHEID - Performed in [place] hospital lab), Hosp Order     Status: None   Collection Time: 11/15/18  9:35 PM   Specimen: Nasopharyngeal Swab  Result Value Ref Range Status   SARS Coronavirus 2 NEGATIVE NEGATIVE Final    Comment: (NOTE) If result is NEGATIVE SARS-CoV-2 target nucleic acids are NOT DETECTED. The SARS-CoV-2 RNA is generally detectable in upper and lower  respiratory specimens during the acute phase of infection. The lowest  concentration of SARS-CoV-2 viral copies this assay can detect is 250  copies / mL. A negative result does not preclude SARS-CoV-2 infection  and should not be used as the sole basis for treatment or other  patient management decisions.  A negative result may occur with  improper specimen collection / handling, submission of specimen other  than nasopharyngeal swab, presence of viral mutation(s) within the  areas targeted by this assay, and  inadequate number of viral copies  (<250 copies / mL). A negative result must be combined with clinical  observations, patient history, and epidemiological information. If result is POSITIVE SARS-CoV-2 target nucleic acids are DETECTED. The SARS-CoV-2 RNA is generally detectable in upper and lower  respiratory specimens dur ing the acute phase of infection.  Positive  results are indicative of active infection with SARS-CoV-2.  Clinical  correlation with patient history and other diagnostic information is  necessary to determine patient infection status.  Positive results do  not rule out bacterial infection or co-infection with other viruses. If result is PRESUMPTIVE POSTIVE SARS-CoV-2 nucleic acids MAY BE PRESENT.   A presumptive positive result was obtained on the submitted specimen  and confirmed on repeat testing.  While 09/13/17 novel coronavirus  (SARS-CoV-2) nucleic acids may be present in the submitted sample  additional confirmatory testing may be necessary for epidemiological  and / or clinical management purposes  to differentiate between  SARS-CoV-2 and other Sarbecovirus currently known to infect humans.  If clinically indicated additional testing with an alternate test  methodology [phone]) is advised. The SARS-CoV-2 RNA is generally  detectable in upper and lower respiratory sp ecimens during the acute  phase of infection. The expected result is Negative. Fact Sheet for Patients:  StrictlyIdeas.no Fact Sheet for Healthcare Providers: BankingDealers.co.za This test is not yet approved or cleared by the [place] FDA and has been authorized for detection and/or diagnosis of SARS-CoV-2 by FDA under an Emergency Use Authorization (EUA).  This EUA will remain in effect (meaning this test can be used) for the duration of the COVID-19 declaration under Section 564(b)(1) of the Act, 21 U.S.C. section 360bbb-3(b)(1), unless the  authorization is terminated or revoked sooner. Performed at [facility], [address] [place], [place] [address]     Coagulation Studies: Recent Labs    11/15/18 09/14/15  LABPROT 13.0  INR 1.0    Urinalysis: No results for input(s): COLORURINE, LABSPEC, PHURINE, GLUCOSEU, HGBUR, BILIRUBINUR, KETONESUR, PROTEINUR, UROBILINOGEN, NITRITE, LEUKOCYTESUR in the last 72 hours.  Invalid input(s): APPERANCEUR    Imaging: Dg Chest Port 1 View  Result Date: 11/15/2018 CLINICAL DATA:  Left-sided chest pain EXAM: PORTABLE CHEST 1 VIEW COMPARISON:  09/20/2018, 08/12/2018, 04/24/2018 FINDINGS: Moderate-to-marked cardiomegaly, chronic. Small bilateral pleural effusions. Vascular congestion with mild interstitial edema. Increased focal airspace disease at the left greater than right lung base. Aortic atherosclerosis. No pneumothorax. Vascular stent in the right subclavian region. IMPRESSION: 1. Cardiomegaly with vascular congestion, mild diffuse interstitial edema and small pleural effusions. 2. Focal airspace disease at the left greater than right lung base may reflect atelectasis or pneumonia. Electronically Signed   By: Madie Reno.D.  On: 11/15/2018 21:42     Medications:   . heparin 1,350 Units/hr (11/16/18 0106)   . aspirin EC  325 mg Oral Daily  . atorvastatin  10 mg Oral QHS  . calcium acetate  667 mg Oral Q breakfast  . erythromycin  1 application Right Eye QHS  . fluticasone  1 spray Each Nare Daily  . loratadine  10 mg Oral Daily  . midodrine  10 mg Oral TID WC  . multivitamin with minerals  1 tablet Oral BID  . pantoprazole  40 mg Oral Daily  . pregabalin  75 mg Oral Daily  . sevelamer carbonate  800 mg Oral TID WC  . tiotropium  18 mcg Inhalation Daily   acetaminophen, albuterol, docusate sodium, guaiFENesin, ipratropium-albuterol, morphine injection, nitroGLYCERIN, ondansetron (ZOFRAN) IV, polyethylene glycol, traMADol  Assessment/ Plan:  63 y.o.  African-American male with ESRD on hemodialysis, hypertension, hyperlipidemia, anemia, COPD, peripheral vascular disease, left BKA, right toe amputations, severe coronary artery disease, chronic systolic CHF with EF of 83%.  From March 2020 [facility].  Active Problems:   NSTEMI (non-ST elevated myocardial infarction) ([place])   Fluid overload   #.  End-stage renal disease.  MWF dialysis Recent Labs    11/15/18 2117 11/16/18 0537  CREATININE 10.60* 11.17*  Last dialysis was Friday We will arrange hemodialysis today Concerned about hypotension.  Patient takes Midrin chronically. Range of blood pressure during previous admission was 81-8 21 systolic  #. Anemia of CKD  Lab Results  Component Value Date   HGB 14.3 11/16/2018  Monitor Hold off Epogen  #. SHPTH     Component Value Date/Time   PTH 246 (H) 08/12/2018 1417   Lab Results  Component Value Date   PHOS 5.1 (H) 08/16/2018  Continue sevelamer  #Left-sided chest pain, non-STEMI with elevated troponin Currently being monitored in ICU stepdown Cardiology evaluation ongoing.  2D echo results pending.  #Mild hyperkalemia Expected to improve with hemodialysis    LOS: [place] 6/22/202011:20 [place], [place]

## 2018-11-16 NOTE — Progress Notes (Signed)
ANTICOAGULATION CONSULT NOTE - Initial Consult  Pharmacy Consult for heparin Indication: chest pain/ACS  Allergies  Allergen Reactions  . Dust Mite Extract Other (See Comments)    Reaction: unknown    Patient Measurements: Height: 6\' 2"  (188 cm) Weight: 211 lb 10.3 oz (96 kg) IBW/kg (Calculated) : 82.2 Heparin Dosing Weight: 96 kg  Vital Signs: Temp: 99.2 F (37.3 C) (06/21 1952) Temp Source: Oral (06/21 1952) BP: 92/38 (06/22 0400) Pulse Rate: 90 (06/22 0200)  Labs: Recent Labs    11/15/18 2117 11/16/18 0537  HGB 14.5 14.3  HCT 45.8 44.4  PLT 142* 146*  APTT 31  --   LABPROT 13.0  --   INR 1.0  --   HEPARINUNFRC  --  0.35  CREATININE 10.60* 11.17*  TROPONINI 0.96*  --     Estimated Creatinine Clearance: 7.8 mL/min (A) (by C-G formula based on SCr of 11.17 mg/dL (H)).   Medical History: Past Medical History:  Diagnosis Date  . 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 Amputee, below knee, left ([place])   . Anemia   . Asthma   . Cardiomyopathy ([place])   . CHF (congestive heart failure) ([place])   . Chronic systolic heart failure ([place])   . Complication of anesthesia    hypotension  . COPD (chronic obstructive pulmonary disease) ([place])   . Coronary artery disease   . Dialysis patient ([place])    Mon, Wed, Fri  . End stage renal disease ([place])   . GERD (gastroesophageal reflux disease)   . Headache   . History of kidney stones   . History of pulmonary embolism   . HLD (hyperlipidemia)   . HTN (hypertension)   . Hyperparathyroidism   . Myocardial infarction ([place])   . Peripheral vascular disease ([place])   . Shortness of breath dyspnea   . Sleep apnea    NO C-PAP, Patient stated in process of  "getting one"   . Tobacco dependence     Medications:  Scheduled:  . aspirin EC  325 mg Oral Daily  . atorvastatin  10 mg Oral QHS  . calcium acetate  667 mg Oral Daily  . carvedilol  6.25 mg Oral BID  . erythromycin  1 application Right Eye QHS  .  fluticasone  1 spray Each Nare Daily  . isosorbide mononitrate  30 mg Oral Daily  . loratadine  10 mg Oral Daily  . midodrine  10 mg Oral Q8H  . multivitamin with minerals  1 tablet Oral BID  . pantoprazole  40 mg Oral Daily  . pregabalin  75 mg Oral Daily  . sevelamer carbonate  800 mg Oral TID WC  . tiotropium  18 mcg Inhalation Daily    Assessment: Patient admitted for LS CP and fluid overload as evidenced by cardiomegaly on CXR. Patient has a h/o ESRD on HD MWF. BNP 2274, initial trop of 0.96; patient was here back in 04/26 w/ trops of 0.12 >> 0.11. EKG showing LBBB. Patient is not on anticoagulation PTA. Patient is being started on heparin for probable NSTEMI.  Goal of Therapy:  Heparin level 0.3-0.7 units/ml Monitor platelets by anticoagulation protocol: Yes   Plan:  06/22 @ 0530 HL 0.35 therapeutic. Will continue current rate and will recheck HL @ 1200, CBC stable will continue to monitor.  Tobie Lords, PharmD, BCPS Clinical Pharmacist 11/16/2018,6:14 AM

## 2018-11-16 NOTE — ED Notes (Signed)
Jane RN assisted this nurse in repositioning pt - BP in 80's - he remains with increased work of breathing with RR in 30's (on 3L O2 via n/c) - denies any pain or [facility]

## 2018-11-16 NOTE — Progress Notes (Signed)
Established hemodialysis patient known at [facility] MWF 6:00.  Elvera Bicker Dialysis Coordinator [phone]

## 2018-11-16 NOTE — Progress Notes (Signed)
Pre HD Tx    11/16/18 1545  Hand-Off documentation  Report given to (Full Name) Trellis Paganini RN  Report received from (Full Name) Roselyn Reef RN ICU  Vital Signs  Temp 98.4 F (36.9 C)  Temp Source Axillary  Pulse Rate 79  Pulse Rate Source Monitor  Resp 19  BP 95/61  BP Location Right Arm  BP Method Automatic  Patient Position (if appropriate) Sitting  Oxygen Therapy  SpO2 99 %  O2 Device Nasal Cannula  O2 Flow Rate (L/min) 3 L/min  Pain Assessment  Pain Scale 0-10  Pain Score 0  Dialysis Weight  Weight 96.4 kg  Type of Weight Pre-Dialysis  Time-Out for Hemodialysis  What Procedure? Hemodialysis  Pt Identifiers(min of two) First/Last Name;MRN/Account#  Correct Site? Yes  Correct Side? Yes  Correct Procedure? Yes  Consents Verified? Yes  Rad Studies Available? N/A  Safety Precautions Reviewed? Yes  Engineer, civil (consulting) Number 3  Station Number  (ICU 18)  UF/Alarm Test Passed  Conductivity: Meter 14  Conductivity: Machine  14  pH 7.2  Reverse Osmosis WRO1  Normal Saline Lot Number [id number]  Dialyzer Lot Number [id number]  Disposable Set Lot Number [id number]  Machine Temperature 98.6 F (37 C)  Musician and Audible Yes  Blood Lines Intact and Secured Yes  Pre Treatment Patient Checks  Vascular access used during treatment Fistula  Hepatitis B Surface Antigen Results Negative  Date Hepatitis B Surface Antigen Drawn 11/17/17  Hepatitis B Surface Antibody  (>10)  Date Hepatitis B Surface Antibody Drawn 09/18/17  Hemodialysis Consent Verified Yes  Hemodialysis Standing Orders Initiated Yes  ECG (Telemetry) Monitor On Yes  Prime Ordered Normal Saline  Length of  DialysisTreatment -hour(s) 3 Hour(s)  Dialysis Treatment Comments Na 140  Dialyzer Elisio 17H NR  Dialysate 3K;2.5 Ca  Dialysate Flow Ordered 400  Blood Flow Rate Ordered 800 mL/min  Ultrafiltration Goal 0.5 Liters  Pre Treatment Labs Phosphorus;Hepatitis B Surface Antigen;Other  (Comment) (lactic)  Dialysis Blood Pressure Support Ordered Normal Saline  Education / Care Plan  Dialysis Education Provided Yes  Documented Education in Care Plan Yes  Fistula / Graft Left Upper arm Arteriovenous fistula  No Placement Date or Time found.   Placed prior to admission: Yes  Orientation: Left  Access Location: Upper arm  Access Type: Arteriovenous fistula  Site Condition No complications  Fistula / Graft Assessment Present;Thrill;Bruit  Status Accessed  Needle Size 15  Drainage Description None

## 2018-11-16 NOTE — Progress Notes (Signed)
HD Tx End  511mL fluid removal, pt stable post tx.   11/16/18 1900  Vital Signs  Temp 98 F (36.7 C)  Temp Source Axillary  Pulse Rate 74  Pulse Rate Source Monitor  Resp 18  BP 100/75 (BP improved after recheck)  BP Location Right Arm  BP Method Automatic  Patient Position (if appropriate) Lying  Oxygen Therapy  SpO2 100 %  O2 Device Nasal Cannula  O2 Flow Rate (L/min) 3 L/min  Pain Assessment  Pain Scale 0-10  Pain Score 5  Pain Type Acute pain (happening over a few days)  Pain Location Flank  Pain Orientation Left  Pain Intervention(s) RN made aware  Dialysis Weight  Weight 95.9 kg  Type of Weight Post-Dialysis  During Hemodialysis Assessment  Blood Flow Rate (mL/min) 200 mL/min  Arterial Pressure (mmHg) -100 mmHg  Venous Pressure (mmHg) 120 mmHg  Transmembrane Pressure (mmHg) 40 mmHg  Ultrafiltration Rate (mL/min) 330 mL/min  Dialysate Flow Rate (mL/min) 800 ml/min  Conductivity: Machine  14  HD Safety Checks Performed Yes  Dialysis Fluid Bolus Normal Saline  Bolus Amount (mL) 250 mL  Intra-Hemodialysis Comments Tx completed  Post-Hemodialysis Assessment  Rinseback Volume (mL) 250 mL  Dialyzer Clearance Lightly streaked  Duration of HD Treatment -hour(s) 3 hour(s)  Hemodialysis Intake (mL) 500 mL  UF Total -Machine (mL) 1000 mL  Net UF (mL) 500 mL  Tolerated HD Treatment Yes  AVG/AVF Arterial Site Held (minutes) 10 minutes  AVG/AVF Venous Site Held (minutes) 10 minutes  Fistula / Graft Left Upper arm Arteriovenous fistula  No Placement Date or Time found.   Placed prior to admission: Yes  Orientation: Left  Access Location: Upper arm  Access Type: Arteriovenous fistula  Site Condition No complications  Fistula / Graft Assessment Present;Thrill;Bruit  Status Deaccessed  Drainage Description None

## 2018-11-16 NOTE — ED Notes (Signed)
Resting quietly at this time, no acute distress noted. °

## 2018-11-16 NOTE — Progress Notes (Signed)
[place] [facility] [place] NAME: Jaxan Michel    MR#:  [id number]  DATE OF BIRTH:  September 08, 1955  SUBJECTIVE:  CHIEF COMPLAINT:   Chief Complaint  Patient presents with  . Chest Pain  . Shortness of Breath   Admitted with chest pain and shortness of breath.  Noted to have elevated troponin.  Also noted to have low blood pressures in ER in stepdown unit. REVIEW OF SYSTEMS:  CONSTITUTIONAL: No fever, fatigue or weakness.  EYES: No blurred or double vision.  EARS, NOSE, AND THROAT: No tinnitus or ear pain.  RESPIRATORY: No cough, shortness of breath, wheezing or hemoptysis.  CARDIOVASCULAR: Have chest pain, no orthopnea, edema.  GASTROINTESTINAL: No nausea, vomiting, diarrhea or abdominal pain.  GENITOURINARY: No dysuria, hematuria.  ENDOCRINE: No polyuria, nocturia,  HEMATOLOGY: No anemia, easy bruising or bleeding SKIN: No rash or lesion. MUSCULOSKELETAL: No joint pain or arthritis.   NEUROLOGIC: No tingling, numbness, weakness.  PSYCHIATRY: No anxiety or depression.   ROS  DRUG ALLERGIES:   Allergies  Allergen Reactions  . Dust Mite Extract Other (See Comments)    Reaction: unknown    VITALS:  Blood pressure 95/60, pulse 77, temperature 98.4 F (36.9 C), temperature source Axillary, resp. rate 15, height 6\' 2"  (1.88 m), weight 96.4 kg, SpO2 99 %.  PHYSICAL EXAMINATION:  GENERAL:  63 y.o.-year-old patient lying in the bed with acute distress due to chest pain.  EYES: Pupils equal, round, reactive to light and accommodation. No scleral icterus. Extraocular muscles intact.  HEENT: Head atraumatic, normocephalic. Oropharynx and nasopharynx clear.  NECK:  Supple, no jugular venous distention. No thyroid enlargement, no tenderness.  LUNGS: Normal breath sounds bilaterally, no wheezing, have crepitation. No use of accessory muscles of respiration.  CARDIOVASCULAR: S1, S2 normal. No murmurs, rubs, or gallops.  ABDOMEN: Soft, nontender,  nondistended. Bowel sounds present. No organomegaly or mass.  EXTREMITIES: No pedal edema, cyanosis, or clubbing.  NEUROLOGIC: Cranial nerves II through XII are intact. Muscle strength 4/5 in all extremities. Sensation intact. Gait not checked.  PSYCHIATRIC: The patient is alert and oriented x 3.  SKIN: No obvious rash, lesion, or ulcer.   Physical Exam LABORATORY PANEL:   CBC Recent Labs  Lab 11/16/18 0537  WBC 10.3  HGB 14.3  HCT 44.4  PLT 146*   ------------------------------------------------------------------------------------------------------------------  Chemistries  Recent Labs  Lab 11/15/18 2117 11/16/18 0537  NA 143 140  K 5.3* 5.2*  CL 101 100  CO2 27 26  GLUCOSE 121* 110*  BUN 62* 67*  CREATININE 10.60* 11.17*  CALCIUM 10.1 9.6  AST 34  --   ALT 34  --   ALKPHOS 139*  --   BILITOT 0.6  --    ------------------------------------------------------------------------------------------------------------------  Cardiac Enzymes Recent Labs  Lab 11/15/18 2117 11/16/18 1639  TROPONINI 0.96* 8.28*   ------------------------------------------------------------------------------------------------------------------  RADIOLOGY:  Dg Chest Port 1 View  Result Date: 11/15/2018 CLINICAL DATA:  Left-sided chest pain EXAM: PORTABLE CHEST 1 VIEW COMPARISON:  09/20/2018, 08/12/2018, 04/24/2018 FINDINGS: Moderate-to-marked cardiomegaly, chronic. Small bilateral pleural effusions. Vascular congestion with mild interstitial edema. Increased focal airspace disease at the left greater than right lung base. Aortic atherosclerosis. No pneumothorax. Vascular stent in the right subclavian region. IMPRESSION: 1. Cardiomegaly with vascular congestion, mild diffuse interstitial edema and small pleural effusions. 2. Focal airspace disease at the left greater than right lung base may reflect atelectasis or pneumonia. Electronically Signed   By: Donavan Foil M.D.   On:  11/15/2018 21:42     ASSESSMENT AND PLAN:   Active Problems:   NSTEMI (non-ST elevated myocardial infarction) (HCC)   Fluid overload  1.  Non-STEMI.   stepdown unit.    on aspirin and IV heparin.   statin therapy  on PRN IV morphine sulfate and sublingual nitroglycerin.  With stabilize blood pressure Nitropaste can be added. Hold beta-blockers due to low blood pressure.  2D echo and a cardiology consultation  2.  Acute on chronic systolic CHF.  Most recent EF was 20 to 25% in  mildly dilated left and right atria with trivial aortic regurgitation.  The patient will be diuresed with IV Lasix.  Nephrology consultation obtained for hemodialysis.  3.  End-stage renal disease on hemodialysis.  Nephrology consult continue his Renvela.  4.  Dyslipidemia.  Statin therapy will be resumed.  5.  GERD.  PPI therapy will be resumed.  6.  COPD.  We will continue Spiriva and place the patient on PRN duo nebs.   7.  DVT prophylaxis.  The patient will be on IV heparin for his ACS.  8.  Hypotension-I have started on normal saline bolus 500 mL.  Notified cardiologist and intensivist about this.   All the records are reviewed and case discussed with Care Management/Social Workerr. Management plans discussed with the patient, family and they are in agreement.  CODE STATUS: Full code  TOTAL TIME TAKING CARE OF THIS PATIENT: 40 critical care minutes.   Discussed with patient about his critical condition and possibility of further worsening and in that situation his wishes for CPR and ventilatory support, also informed him that with having kidney failure, severe cardiomyopathy and ejection fraction of 20%-the outcome of CPR or ventilatory support may not be very good but he still want to have all available options of treatment when needed.  He want to be full code.  POSSIBLE D/C IN 2-3 DAYS, DEPENDING ON CLINICAL CONDITION.   Vaughan Basta M.D on 11/16/2018   Between 7am to 6pm - Pager -  [phone]  After 6pm go to [email or web address] - password [email or web address] [facility]  Office  [phone]  CC: Primary care physician; Donnie Coffin, MD  Note: This dictation was prepared with Dragon dictation along with smaller phrase technology. Any transcriptional errors that result from this process are unintentional.

## 2018-11-16 NOTE — ED Notes (Addendum)
Notified Network engineer again to page admitting provider Notified charge that pt BP in the 60's again  Pt denies any pain or SHOB (O2 sat 94% RA) - pt states that he does not feel dizzy or like he is going to pass out - pt is alert and oriented X4 but appears with increased work of breathing with RR in 30's

## 2018-11-16 NOTE — ED Notes (Signed)
Patient repositioned for comfort.  Explained about admission and they we are awaiting room assignment, verbalized understanding.

## 2018-11-16 NOTE — ED Notes (Signed)
Continues to rest quietly with eyes closed

## 2018-11-16 NOTE — Progress Notes (Signed)
ANTICOAGULATION CONSULT NOTE - Initial Consult  Pharmacy Consult for heparin Indication: chest pain/ACS  Allergies  Allergen Reactions  . Dust Mite Extract Other (See Comments)    Reaction: unknown    Patient Measurements: Height: 6\' 2"  (188 cm) Weight: 212 lb 8.4 oz (96.4 kg) IBW/kg (Calculated) : 82.2 Heparin Dosing Weight: 96 kg  Vital Signs: Temp: 98.4 F (36.9 C) (06/22 1545) Temp Source: Axillary (06/22 1545) BP: 90/47 (06/22 1715) Pulse Rate: 76 (06/22 1715)  Labs: Recent Labs    11/15/18 2117 11/16/18 0537 11/16/18 1639  HGB 14.5 14.3  --   HCT 45.8 44.4  --   PLT 142* 146*  --   APTT 31  --   --   LABPROT 13.0  --   --   INR 1.0  --   --   HEPARINUNFRC  --  0.35 0.51  CREATININE 10.60* 11.17*  --   TROPONINI 0.96*  --  8.28*    Estimated Creatinine Clearance: 7.8 mL/min (A) (by C-G formula based on SCr of 11.17 mg/dL (H)).   Medical History: Past Medical History:  Diagnosis Date  . 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 Amputee, below knee, left ([place])   . Anemia   . Asthma   . Cardiomyopathy ([place])   . CHF (congestive heart failure) ([place])   . Chronic systolic heart failure ([place])   . Complication of anesthesia    hypotension  . COPD (chronic obstructive pulmonary disease) ([place])   . Coronary artery disease   . Dialysis patient ([place])    Mon, Wed, Fri  . End stage renal disease ([place])   . GERD (gastroesophageal reflux disease)   . Headache   . History of kidney stones   . History of pulmonary embolism   . HLD (hyperlipidemia)   . HTN (hypertension)   . Hyperparathyroidism   . Myocardial infarction ([place])   . Peripheral vascular disease ([place])   . Shortness of breath dyspnea   . Sleep apnea    NO C-PAP, Patient stated in process of  "getting one"   . Tobacco dependence     Medications:  Scheduled:  . aspirin EC  325 mg Oral Daily  . atorvastatin  10 mg Oral QHS  . calcium acetate  667 mg Oral Q breakfast  . [START ON  11/17/2018] Chlorhexidine Gluconate Cloth  6 each Topical Q0600  . [START ON 11/17/2018] Chlorhexidine Gluconate Cloth  6 each Topical Q0600  . diclofenac sodium  2 g Topical QID  . erythromycin  1 application Right Eye QHS  . fluticasone  1 spray Each Nare Daily  . loratadine  10 mg Oral Daily  . midodrine  10 mg Oral TID WC  . multivitamin with minerals  1 tablet Oral BID  . mupirocin ointment  1 application Nasal BID  . pantoprazole  40 mg Oral Daily  . pregabalin  75 mg Oral Daily  . sevelamer carbonate  800 mg Oral TID WC  . tiotropium  18 mcg Inhalation Daily    Assessment: Patient admitted for LS CP and fluid overload as evidenced by cardiomegaly on CXR. Patient has a h/o ESRD on HD MWF. BNP 2274, initial trop of 0.96; patient was here back in 04/26 w/ trops of 0.12 >> 0.11. EKG showing LBBB. Patient is not on anticoagulation PTA. Patient is being started on heparin for probable NSTEMI.  Goal of Therapy:  Heparin level 0.3-0.7 units/ml Monitor platelets by anticoagulation protocol: Yes  Plan:  06/22 @ 0530 HL 0.35 therapeutic. Will continue current rate and will recheck HL @ 1200, CBC stable will continue to monitor.  6/22@1703  HL 0.51 therapeutic x2. Will continue current rate and will recheck HL with AM labs @ 0500, CBC stable will continue to monitor.  Kristeen Miss, PharmD Clinical Pharmacist 11/16/2018,5:27 PM

## 2018-11-16 NOTE — ED Notes (Signed)
Admitting provider to bedside - he states to give pt NACL 500cc bolus over a 2 hour period and that he will change pt to step down

## 2018-11-17 ENCOUNTER — Inpatient Hospital Stay: Payer: Medicaid Other

## 2018-11-17 ENCOUNTER — Other Ambulatory Visit: Payer: Self-pay

## 2018-11-17 DIAGNOSIS — I214 Non-ST elevation (NSTEMI) myocardial infarction: Principal | ICD-10-CM

## 2018-11-17 LAB — CBC
HCT: 41.4 % (ref 39.0–52.0)
Hemoglobin: 13.4 g/dL (ref 13.0–17.0)
MCH: 29.4 pg (ref 26.0–34.0)
MCHC: 32.4 g/dL (ref 30.0–36.0)
MCV: 90.8 fL (ref 80.0–100.0)
Platelets: 129 10*3/uL — ABNORMAL LOW (ref 150–400)
RBC: 4.56 MIL/uL (ref 4.22–5.81)
RDW: 16.9 % — ABNORMAL HIGH (ref 11.5–15.5)
WBC: 8.8 10*3/uL (ref 4.0–10.5)
nRBC: 0 % (ref 0.0–0.2)

## 2018-11-17 LAB — HEPARIN LEVEL (UNFRACTIONATED): Heparin Unfractionated: 0.32 IU/mL (ref 0.30–0.70)

## 2018-11-17 MED ORDER — MOMETASONE FURO-FORMOTEROL FUM 100-5 MCG/ACT IN AERO
2.0000 | INHALATION_SPRAY | Freq: Two times a day (BID) | RESPIRATORY_TRACT | Status: DC
  Administered 2018-11-17 – 2018-11-20 (×6): 2 via RESPIRATORY_TRACT
  Filled 2018-11-17: qty 8.8

## 2018-11-17 NOTE — Progress Notes (Signed)
[facility], [place] 11/17/18  Subjective:   LOS: 2 06/22 0701 - 06/23 0700 In: 188.2 [I.V.:188.2] Out: 500  Patient presented to the emergency room from the [facility] via EMS for left-sided chest pain which has been going on for 2 to 3 days.  He reports radiation down left arm, shoulder, left lower chest.  Denies any nausea, vomiting, diarrhea.  Chest x-ray showed cardiomegaly with vascular congestion, mild interstitial diffuse edema and small pleural effusions.  Suspicious for pneumonia.  His blood pressure has been low Patient normally dialyzes at [facility] MWF  + Troponin to 9 States he has chest pain with cough + Fever T Max 102.7 last night No edema Tolerated HD well yesterday Heparin drip NPO for possible cath  Objective:  Vital signs in last 24 hours:  Temp:  [98 F (36.7 C)-102.7 F (39.3 C)] 99.9 F (37.7 C) (06/23 0800) Pulse Rate:  [74-96] 85 (06/23 0800) Resp:  [15-25] 20 (06/23 0800) BP: (64-111)/(28-89) 91/65 (06/23 0800) SpO2:  [93 %-100 %] 100 % (06/23 0800) Weight:  [95.9 kg-96.4 kg] 96.1 kg (06/23 0500)  Weight change: 0 kg Filed Weights   11/16/18 1545 11/16/18 1900 11/17/18 0500  Weight: 96.4 kg 95.9 kg 96.1 kg    Intake/Output:    Intake/Output Summary (Last 24 hours) at 11/17/2018 0935 Last data filed at 11/17/2018 0800 Gross per 24 hour  Intake 431.16 ml  Output 500 ml  Net -68.84 ml     Physical Exam: General:  Chronically ill appearing gentleman, laying in the bed  HEENT  anicteric, moist oral mucous membranes  Neck  supple, no masses  Pulm/lungs Normal effort, clear to auscultation, [place] O2  CVS/Heart  no rub  Abdomen:   Soft, nontender  Extremities:  Left BKA, trace edema on the right  Neurologic:  Alert, oriented  Skin:  No acute rashes  Access:  Left arm AV fistula, good bruit and thrill       Basic Metabolic Panel:  Recent Labs  Lab 11/15/18 2117 11/16/18 0537  11/16/18 1639  NA 143 140  --   K 5.3* 5.2*  --   CL 101 100  --   CO2 27 26  --   GLUCOSE 121* 110*  --   BUN 62* 67*  --   CREATININE 10.60* 11.17*  --   CALCIUM 10.1 9.6  --   PHOS  --   --  4.6     CBC: Recent Labs  Lab 11/15/18 2117 11/16/18 0537 11/17/18 0447  WBC 9.5 10.3 8.8  NEUTROABS 6.0  --   --   HGB 14.5 14.3 13.4  HCT 45.8 44.4 41.4  MCV 91.1 89.9 90.8  PLT 142* 146* 129*      Lab Results  Component Value Date   HEPBSAG Negative 09/18/2017   HEPBSAB Non Reactive 05/22/2016      Microbiology:  Recent Results (from the past 240 hour(s))  Blood culture (routine x 2)     Status: None (Preliminary result)   Collection Time: 11/15/18  9:18 PM   Specimen: BLOOD  Result Value Ref Range Status   Specimen Description BLOOD RIGHT ANTECUBITAL  Final   Special Requests   Final    BOTTLES DRAWN AEROBIC AND ANAEROBIC Blood Culture adequate volume   Culture   Final    NO GROWTH 2 DAYS Performed at [facility], [address] [place], [place] [address]    Report Status PENDING  Incomplete  Blood culture (routine x 2)     Status: None (Preliminary result)   Collection Time: 11/15/18  9:19 PM   Specimen: BLOOD  Result Value Ref Range Status   Specimen Description BLOOD BLOOD RIGHT HAND  Final   Special Requests   Final    BOTTLES DRAWN AEROBIC AND ANAEROBIC Blood Culture adequate volume   Culture   Final    NO GROWTH 2 DAYS Performed at [facility], [address] [place], [place] [address]    Report Status PENDING  Incomplete  SARS Coronavirus 2 (CEPHEID - Performed in [place] hospital lab), Hosp Order     Status: None   Collection Time: 11/15/18  9:35 PM   Specimen: Nasopharyngeal Swab  Result Value Ref Range Status   SARS Coronavirus 2 NEGATIVE NEGATIVE Final    Comment: (NOTE) If result is NEGATIVE SARS-CoV-2 target nucleic acids are NOT DETECTED. The SARS-CoV-2 RNA is generally detectable in upper and lower   respiratory specimens during the acute phase of infection. The lowest  concentration of SARS-CoV-2 viral copies this assay can detect is 250  copies / mL. A negative result does not preclude SARS-CoV-2 infection  and should not be used as the sole basis for treatment or other  patient management decisions.  A negative result may occur with  improper specimen collection / handling, submission of specimen other  than nasopharyngeal swab, presence of viral mutation(s) within the  areas targeted by this assay, and inadequate number of viral copies  (<250 copies / mL). A negative result must be combined with clinical  observations, patient history, and epidemiological information. If result is POSITIVE SARS-CoV-2 target nucleic acids are DETECTED. The SARS-CoV-2 RNA is generally detectable in upper and lower  respiratory specimens dur ing the acute phase of infection.  Positive  results are indicative of active infection with SARS-CoV-2.  Clinical  correlation with patient history and other diagnostic information is  necessary to determine patient infection status.  Positive results do  not rule out bacterial infection or co-infection with other viruses. If result is PRESUMPTIVE POSTIVE SARS-CoV-2 nucleic acids MAY BE PRESENT.   A presumptive positive result was obtained on the submitted specimen  and confirmed on repeat testing.  While 2019 novel coronavirus  (SARS-CoV-2) nucleic acids may be present in the submitted sample  additional confirmatory testing may be necessary for epidemiological  and / or clinical management purposes  to differentiate between  SARS-CoV-2 and other Sarbecovirus currently known to infect humans.  If clinically indicated additional testing with an alternate test  methodology [phone]) is advised. The SARS-CoV-2 RNA is generally  detectable in upper and lower respiratory sp ecimens during the acute  phase of infection. The expected result is Negative. Fact  Sheet for Patients:  StrictlyIdeas.no Fact Sheet for Healthcare Providers: BankingDealers.co.za This test is not yet approved or cleared by the [place] FDA and has been authorized for detection and/or diagnosis of SARS-CoV-2 by FDA under an Emergency Use Authorization (EUA).  This EUA will remain in effect (meaning this test can be used) for the duration of the COVID-19 declaration under Section 564(b)(1) of the Act, 21 U.S.C. section 360bbb-3(b)(1), unless the authorization is terminated or revoked sooner. Performed at [facility] [address] [place], [place] [address]   MRSA PCR Screening     Status: Abnormal   Collection Time: 11/16/18  9:39 AM   Specimen: Nasopharyngeal  Result Value Ref Range Status   MRSA by PCR  POSITIVE (A) NEGATIVE Final    Comment:        The GeneXpert MRSA Assay (FDA approved for NASAL specimens only), is one component of a comprehensive MRSA colonization surveillance program. It is not intended to diagnose MRSA infection nor to guide or monitor treatment for MRSA infections. RESULT CALLED TO, READ BACK BY AND VERIFIED WITH: JAMIE DEEM @1150  11/16/18 AKT Performed at [facility], [address] [place], [place] [address]     Coagulation Studies: Recent Labs    11/15/18 08-19-2115  LABPROT 13.0  INR 1.0    Urinalysis: No results for input(s): COLORURINE, LABSPEC, PHURINE, GLUCOSEU, HGBUR, BILIRUBINUR, KETONESUR, PROTEINUR, UROBILINOGEN, NITRITE, LEUKOCYTESUR in the last 72 hours.  Invalid input(s): APPERANCEUR    Imaging: Dg Chest Port 1 View  Result Date: 11/15/2018 CLINICAL DATA:  Left-sided chest pain EXAM: PORTABLE CHEST 1 VIEW COMPARISON:  09/20/2018, 08/12/2018, 04/24/2018 FINDINGS: Moderate-to-marked cardiomegaly, chronic. Small bilateral pleural effusions. Vascular congestion with mild interstitial edema. Increased focal airspace disease at the left greater  than right lung base. Aortic atherosclerosis. No pneumothorax. Vascular stent in the right subclavian region. IMPRESSION: 1. Cardiomegaly with vascular congestion, mild diffuse interstitial edema and small pleural effusions. 2. Focal airspace disease at the left greater than right lung base may reflect atelectasis or pneumonia. Electronically Signed   By: Donavan Foil M.D.   On: 11/15/2018 21:42     Medications:   . heparin 1,350 Units/hr (11/16/18 1845)   . aspirin EC  325 mg Oral Daily  . atorvastatin  10 mg Oral QHS  . calcium acetate  667 mg Oral Q breakfast  . Chlorhexidine Gluconate Cloth  6 each Topical Q0600  . Chlorhexidine Gluconate Cloth  6 each Topical Q0600  . diclofenac sodium  2 g Topical QID  . erythromycin  1 application Right Eye QHS  . fluticasone  1 spray Each Nare Daily  . loratadine  10 mg Oral Daily  . midodrine  10 mg Oral TID WC  . multivitamin with minerals  1 tablet Oral BID  . mupirocin ointment  1 application Nasal BID  . pantoprazole  40 mg Oral Daily  . pregabalin  75 mg Oral Daily  . sevelamer carbonate  800 mg Oral TID WC  . tiotropium  18 mcg Inhalation Daily   acetaminophen, albuterol, docusate sodium, guaiFENesin, ipratropium-albuterol, morphine injection, nitroGLYCERIN, ondansetron (ZOFRAN) IV, polyethylene glycol, traMADol  Assessment/ Plan:  63 y.o. African-American male with ESRD on hemodialysis, hypertension, hyperlipidemia, anemia, COPD, peripheral vascular disease, left BKA, right toe amputations, severe coronary artery disease, chronic systolic CHF with EF of 16%.  From Aug 19, 2018 MWF [facility].  Active Problems:   NSTEMI (non-ST elevated myocardial infarction) ([place])   Fluid overload   #.  End-stage renal disease.  MWF dialysis Recent Labs    11/15/18 19-Aug-2115 11/16/18 0537  CREATININE 10.60* 11.17*  Tolerated HD well  500 cc removed Next HD planned for tomorrow   #. Anemia of CKD  Lab Results  Component  Value Date   HGB 13.4 11/17/2018  Monitor Hold off Epogen  #. SHPTH     Component Value Date/Time   PTH 246 (H) 08/12/2018 1417   Lab Results  Component Value Date   PHOS 4.6 11/16/2018  Continue sevelamer  #Left-sided chest pain, non-STEMI with elevated troponin, Acute on Ch Systolic CHF Currently being monitored in ICU stepdown Cardiology evaluation ongoing.  2D echo 6/22 shows severely depressed LVEF to 20-25%, severely reduced rt  ventricle systolic function  #Mild hyperkalemia Expected to improve with hemodialysis    LOS: [place] 6/23/20209:35 AM  [place], [place]

## 2018-11-17 NOTE — Progress Notes (Signed)
He is comfortable on nasal cannula oxygen which he wears at home.  LHC planned for tomorrow.  He has no current needs for ICU/SDU level of care.  I have placed order for transfer to 2A. PCCM will sign off. Please call if we can be of further assistance  Merton Border, MD PCCM service Mobile [phone] Pager [phone] 11/17/2018 5:29 PM

## 2018-11-17 NOTE — Progress Notes (Signed)
ANTICOAGULATION CONSULT NOTE - Initial Consult  Pharmacy Consult for heparin Indication: chest pain/ACS  Allergies  Allergen Reactions  . Dust Mite Extract Other (See Comments)    Reaction: unknown    Patient Measurements: Height: 6\' 2"  (188 cm) Weight: 211 lb 13.8 oz (96.1 kg) IBW/kg (Calculated) : 82.2 Heparin Dosing Weight: 96 kg  Vital Signs: Temp: 102.7 F (39.3 C) (06/23 0223) Temp Source: Oral (06/23 0223) BP: 84/48 (06/23 0500) Pulse Rate: 87 (06/23 0500)  Labs: Recent Labs    11/15/18 2117 11/16/18 0537 11/16/18 1639 11/17/18 0447  HGB 14.5 14.3  --  13.4  HCT 45.8 44.4  --  41.4  PLT 142* 146*  --  129*  APTT 31  --   --   --   LABPROT 13.0  --   --   --   INR 1.0  --   --   --   HEPARINUNFRC  --  0.35 0.51 0.32  CREATININE 10.60* 11.17*  --   --   TROPONINI 0.96*  --  8.28*  --     Estimated Creatinine Clearance: 7.8 mL/min (A) (by C-G formula based on SCr of 11.17 mg/dL (H)).   Medical History: Past Medical History:  Diagnosis Date  . 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 Amputee, below knee, left ([place])   . Anemia   . Asthma   . Cardiomyopathy ([place])   . CHF (congestive heart failure) ([place])   . Chronic systolic heart failure ([place])   . Complication of anesthesia    hypotension  . COPD (chronic obstructive pulmonary disease) ([place])   . Coronary artery disease   . Dialysis patient ([place])    Mon, Wed, Fri  . End stage renal disease ([place])   . GERD (gastroesophageal reflux disease)   . Headache   . History of kidney stones   . History of pulmonary embolism   . HLD (hyperlipidemia)   . HTN (hypertension)   . Hyperparathyroidism   . Myocardial infarction ([place])   . Peripheral vascular disease ([place])   . Shortness of breath dyspnea   . Sleep apnea    NO C-PAP, Patient stated in process of  "getting one"   . Tobacco dependence     Medications:  Scheduled:  . aspirin EC  325 mg Oral Daily  . atorvastatin  10 mg Oral QHS  .  calcium acetate  667 mg Oral Q breakfast  . Chlorhexidine Gluconate Cloth  6 each Topical Q0600  . Chlorhexidine Gluconate Cloth  6 each Topical Q0600  . diclofenac sodium  2 g Topical QID  . erythromycin  1 application Right Eye QHS  . fluticasone  1 spray Each Nare Daily  . loratadine  10 mg Oral Daily  . midodrine  10 mg Oral TID WC  . multivitamin with minerals  1 tablet Oral BID  . mupirocin ointment  1 application Nasal BID  . pantoprazole  40 mg Oral Daily  . pregabalin  75 mg Oral Daily  . sevelamer carbonate  800 mg Oral TID WC  . tiotropium  18 mcg Inhalation Daily    Assessment: Patient admitted for LS CP and fluid overload as evidenced by cardiomegaly on CXR. Patient has a h/o ESRD on HD MWF. BNP 2274, initial trop of 0.96; patient was here back in 04/26 w/ trops of 0.12 >> 0.11. EKG showing LBBB. Patient is not on anticoagulation PTA.  Patient is being started on heparin for probable  NSTEMI.  6/22 Heparin infusion started @ 1350 units/hr 06/22 @ 0530 HL 0.35 therapeutic. 6/22@1703  HL 0.51 therapeutic x2.  Goal of Therapy:  Heparin level 0.3-0.7 units/ml Monitor platelets by anticoagulation protocol: Yes   Plan:  06/23 @ 0447HL 0.32. Level is therapeutic. Will continue current infusion rate of 1350 units/hr.  CBC stable, will continue to monitor.  Per protocol HL and CBC ordered with AM labs daily while on heparin infusion.   Pernell Dupre, PharmD, BCPS Clinical Pharmacist 11/17/2018 5:34 AM

## 2018-11-17 NOTE — Progress Notes (Signed)
Patient sitting up in bed, on 02 3 L and sats 93%, No complaints, RN called report to floor  Adonis Huguenin RN accepted report and pt transported with 02 and IV heparin. Pt is NPO for possible cardiac cath in am.

## 2018-11-18 LAB — CBC
HCT: 41.9 % (ref 39.0–52.0)
Hemoglobin: 13.5 g/dL (ref 13.0–17.0)
MCH: 29.2 pg (ref 26.0–34.0)
MCHC: 32.2 g/dL (ref 30.0–36.0)
MCV: 90.5 fL (ref 80.0–100.0)
Platelets: 140 10*3/uL — ABNORMAL LOW (ref 150–400)
RBC: 4.63 MIL/uL (ref 4.22–5.81)
RDW: 16.7 % — ABNORMAL HIGH (ref 11.5–15.5)
WBC: 10.6 10*3/uL — ABNORMAL HIGH (ref 4.0–10.5)
nRBC: 0 % (ref 0.0–0.2)

## 2018-11-18 LAB — BASIC METABOLIC PANEL
Anion gap: 14 (ref 5–15)
BUN: 77 mg/dL — ABNORMAL HIGH (ref 8–23)
CO2: 24 mmol/L (ref 22–32)
Calcium: 9.7 mg/dL (ref 8.9–10.3)
Chloride: 100 mmol/L (ref 98–111)
Creatinine, Ser: 11.79 mg/dL — ABNORMAL HIGH (ref 0.61–1.24)
GFR calc Af Amer: 5 mL/min — ABNORMAL LOW (ref >60)
GFR calc non Af Amer: 4 mL/min — ABNORMAL LOW (ref >60)
Glucose, Bld: 98 mg/dL (ref 70–99)
Potassium: 5.8 mmol/L — ABNORMAL HIGH (ref 3.5–5.1)
Sodium: 138 mmol/L (ref 135–145)

## 2018-11-18 LAB — HEPARIN LEVEL (UNFRACTIONATED)
Heparin Unfractionated: 0.18 IU/mL — ABNORMAL LOW (ref 0.30–0.70)
Heparin Unfractionated: 0.47 IU/mL (ref 0.30–0.70)
Heparin Unfractionated: 0.36 IU/mL (ref 0.30–0.70)

## 2018-11-18 MED ORDER — HEPARIN BOLUS VIA INFUSION
2500.0000 [IU] | Freq: Once | INTRAVENOUS | Status: AC
  Administered 2018-11-18: 2500 [IU] via INTRAVENOUS
  Filled 2018-11-18: qty 2500

## 2018-11-18 MED ORDER — SODIUM CHLORIDE 0.9% FLUSH
3.0000 mL | Freq: Two times a day (BID) | INTRAVENOUS | Status: DC
  Administered 2018-11-18: 3 mL via INTRAVENOUS

## 2018-11-18 NOTE — Progress Notes (Addendum)
HD Tx End   No fluid removal d/t hypotensive episode, did not meet MD UF goal of 500 mL. BP stable post tx.    11/18/18 1445  Vital Signs  Pulse Rate 77  Resp 18  BP 102/60  Oxygen Therapy  SpO2 99 %  During Hemodialysis Assessment  Blood Flow Rate (mL/min) 200 mL/min  Arterial Pressure (mmHg) -90 mmHg  Venous Pressure (mmHg) 100 mmHg  Transmembrane Pressure (mmHg) 30 mmHg  Ultrafiltration Rate (mL/min) 0 mL/min  Dialysate Flow Rate (mL/min) 600 ml/min  Conductivity: Machine  13.6  HD Safety Checks Performed Yes  Dialysis Fluid Bolus Normal Saline  Bolus Amount (mL) 250 mL  Intra-Hemodialysis Comments Tx completed

## 2018-11-18 NOTE — Progress Notes (Signed)
ANTICOAGULATION CONSULT NOTE - Initial Consult  Pharmacy Consult for heparin Indication: chest pain/ACS  Allergies  Allergen Reactions  . Dust Mite Extract Other (See Comments)    Reaction: unknown    Patient Measurements: Height: 6\' 2"  (188 cm) Weight: 212 lb 1.3 oz (96.2 kg) IBW/kg (Calculated) : 82.2 Heparin Dosing Weight: 96 kg  Vital Signs: Temp: 100 F (37.8 C) (06/24 0455) Temp Source: Oral (06/24 0455) BP: 83/63 (06/24 0455) Pulse Rate: 90 (06/24 0455)  Labs: Recent Labs    11/15/18 2117  11/16/18 0537 11/16/18 1639 11/17/18 0447 11/18/18 0728  HGB 14.5  --  14.3  --  13.4 13.5  HCT 45.8  --  44.4  --  41.4 41.9  PLT 142*  --  146*  --  129* 140*  APTT 31  --   --   --   --   --   LABPROT 13.0  --   --   --   --   --   INR 1.0  --   --   --   --   --   HEPARINUNFRC  --    < > 0.35 0.51 0.32 0.18*  CREATININE 10.60*  --  11.17*  --   --  11.79*  TROPONINI 0.96*  --   --  8.28*  --   --    < > = values in this interval not displayed.    Estimated Creatinine Clearance: 7.4 mL/min (A) (by C-G formula based on SCr of 11.79 mg/dL (H)).   Medical History: Past Medical History:  Diagnosis Date  . 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 Amputee, below knee, left ([place])   . Anemia   . Asthma   . Cardiomyopathy ([place])   . CHF (congestive heart failure) ([place])   . Chronic systolic heart failure ([place])   . Complication of anesthesia    hypotension  . COPD (chronic obstructive pulmonary disease) ([place])   . Coronary artery disease   . Dialysis patient ([place])    Mon, Wed, Fri  . End stage renal disease ([place])   . GERD (gastroesophageal reflux disease)   . Headache   . History of kidney stones   . History of pulmonary embolism   . HLD (hyperlipidemia)   . HTN (hypertension)   . Hyperparathyroidism   . Myocardial infarction ([place])   . Peripheral vascular disease ([place])   . Shortness of breath dyspnea   . Sleep apnea    NO C-PAP, Patient stated in  process of  "getting one"   . Tobacco dependence     Medications:  Scheduled:  . aspirin EC  325 mg Oral Daily  . atorvastatin  10 mg Oral QHS  . Chlorhexidine Gluconate Cloth  6 each Topical Q0600  . Chlorhexidine Gluconate Cloth  6 each Topical Q0600  . diclofenac sodium  2 g Topical QID  . erythromycin  1 application Right Eye QHS  . fluticasone  1 spray Each Nare Daily  . loratadine  10 mg Oral Daily  . midodrine  10 mg Oral TID WC  . mometasone-formoterol  2 puff Inhalation BID  . multivitamin with minerals  1 tablet Oral BID  . mupirocin ointment  1 application Nasal BID  . pantoprazole  40 mg Oral Daily  . pregabalin  75 mg Oral Daily  . sevelamer carbonate  800 mg Oral TID WC  . tiotropium  18 mcg Inhalation Daily    Assessment: Patient  admitted for LS CP and fluid overload as evidenced by cardiomegaly on CXR. Patient has a h/o ESRD on HD MWF. BNP 2274, initial trop of 0.96; patient was here back in 04/26 w/ trops of 0.12 >> 0.11. EKG showing LBBB. Patient is not on anticoagulation PTA.  Patient is being started on heparin for probable NSTEMI.  6/22 Heparin infusion started @ 1350 units/hr 06/22 @ 0530 HL 0.35 therapeutic. 6/22@1703  HL 0.51 therapeutic x2. 6/23 @0447  HL 0.32  6/24 @0728  HL 0.18   Goal of Therapy:  Heparin level 0.3-0.7 units/ml Monitor platelets by anticoagulation protocol: Yes   Plan:  Heparin level is subtherapeutic. Will give a 2500 bolus x 1 and increase rate to   f 1600 units/hr. Per RN heparin has been continuously running and no pass off of heparin gtt being stopped over night. CBC stable, will continue to monitor. Will order a heparin level in 6 hours.   Per protocol HL and CBC ordered with AM labs daily while on heparin infusion.   Oswald Hillock, PharmD, BCPS Clinical Pharmacist 11/18/2018 8:14 AM

## 2018-11-18 NOTE — Progress Notes (Signed)
[facility] [place] NAME: [name]    MR#:  [id number]  DATE OF BIRTH:  06-22-1955  SUBJECTIVE:  CHIEF COMPLAINT:   Chief Complaint  Patient presents with  . Chest Pain  . Shortness of Breath   Admitted with chest pain and shortness of breath.  Noted to have elevated troponin.  Also noted to have low blood pressures in ER in stepdown unit. Patient still have chest pain on heparin drip.  Seen by cardiologist and plan is tentatively to do cardiac catheterization.  Received hemodialysis per nephrology.  REVIEW OF SYSTEMS:  CONSTITUTIONAL: No fever, fatigue or weakness.  EYES: No blurred or double vision.  EARS, NOSE, AND THROAT: No tinnitus or ear pain.  RESPIRATORY: No cough, shortness of breath, wheezing or hemoptysis.  CARDIOVASCULAR: Have chest pain, no orthopnea, edema.  GASTROINTESTINAL: No nausea, vomiting, diarrhea or abdominal pain.  GENITOURINARY: No dysuria, hematuria.  ENDOCRINE: No polyuria, nocturia,  HEMATOLOGY: No anemia, easy bruising or bleeding SKIN: No rash or lesion. MUSCULOSKELETAL: No joint pain or arthritis.   NEUROLOGIC: No tingling, numbness, weakness.  PSYCHIATRY: No anxiety or depression.   ROS  DRUG ALLERGIES:   Allergies  Allergen Reactions  . Dust Mite Extract Other (See Comments)    Reaction: unknown    VITALS:  Blood pressure (!) 72/51, pulse 79, temperature 98.4 F (36.9 C), temperature source Oral, resp. rate 19, height 6\' 2"  (1.88 m), weight 92.9 kg, SpO2 100 %.  PHYSICAL EXAMINATION:  GENERAL:  63 y.o.-year-old patient lying in the bed with acute distress due to chest pain.  EYES: Pupils equal, round, reactive to light and accommodation. No scleral icterus. Extraocular muscles intact.  HEENT: Head atraumatic, normocephalic. Oropharynx and nasopharynx clear.  NECK:  Supple, no jugular venous distention. No thyroid enlargement, no tenderness.  LUNGS: Normal breath sounds bilaterally, no  wheezing, have crepitation. No use of accessory muscles of respiration.  CARDIOVASCULAR: S1, S2 normal. No murmurs, rubs, or gallops.  ABDOMEN: Soft, nontender, nondistended. Bowel sounds present. No organomegaly or mass.  EXTREMITIES: No pedal edema, cyanosis, or clubbing.  NEUROLOGIC: Cranial nerves II through XII are intact. Muscle strength 4/5 in all extremities. Sensation intact. Gait not checked.  PSYCHIATRIC: The patient is alert and oriented x 3.  SKIN: No obvious rash, lesion, or ulcer.   Physical Exam LABORATORY PANEL:   CBC Recent Labs  Lab 11/18/18 0728  WBC 10.6*  HGB 13.5  HCT 41.9  PLT 140*   ------------------------------------------------------------------------------------------------------------------  Chemistries  Recent Labs  Lab 11/15/18 2117  11/18/18 0728  NA 143   < > 138  K 5.3*   < > 5.8*  CL 101   < > 100  CO2 27   < > 24  GLUCOSE 121*   < > 98  BUN 62*   < > 77*  CREATININE 10.60*   < > 11.79*  CALCIUM 10.1   < > 9.7  AST 34  --   --   ALT 34  --   --   ALKPHOS 139*  --   --   BILITOT 0.6  --   --    < > = values in this interval not displayed.   ------------------------------------------------------------------------------------------------------------------  Cardiac Enzymes Recent Labs  Lab 11/15/18 2117 11/16/18 1639  TROPONINI 0.96* 8.28*   ------------------------------------------------------------------------------------------------------------------  RADIOLOGY:  Dg Chest 1 View  Result Date: 11/17/2018 CLINICAL DATA:  Fever, history coronary artery disease post MI, hypertension, CHF, COPD, smoker  EXAM: CHEST  1 VIEW COMPARISON:  Portable exam 1001 hours compared to 11/15/2018 FINDINGS: Enlargement of cardiac silhouette with pulmonary vascular congestion. Atherosclerotic calcification aorta. Small bibasilar pleural effusions and minimal atelectasis. Minimal pulmonary edema, slightly improved. No pneumothorax. RIGHT axillary  stent. IMPRESSION: Enlargement of cardiac silhouette with pulmonary vascular congestion and minimal pulmonary edema, slightly improved. Small bibasilar pleural effusions and atelectasis. Electronically Signed   By: Lavonia Dana M.D.   On: 11/17/2018 10:28    ASSESSMENT AND PLAN:   Active Problems:   NSTEMI (non-ST elevated myocardial infarction) (HCC)   Fluid overload  1.  Non-STEMI.   stepdown unit.    Remains stable.  on aspirin and IV heparin.   statin therapy  on PRN IV morphine sulfate and sublingual nitroglycerin.  With stabilize blood pressure Nitropaste can be added. Hold beta-blockers due to low blood pressure.  2D echo and a cardiology consultation-plan for cardiac cath tomorrow.  2.  Acute on chronic systolic CHF.  Most recent EF was 20 to 25% in  mildly dilated left and right atria with trivial aortic regurgitation.  The patient will be diuresed with IV Lasix.  Nephrology consultation obtained for hemodialysis.  Received hemodialysis.  3.  End-stage renal disease on hemodialysis.  Nephrology consult continue his Renvela.  4.  Dyslipidemia.  Statin therapy will be resumed.  5.  GERD.  PPI therapy will be resumed.  6.  COPD.  We will continue Spiriva and place the patient on PRN duo nebs.   7.  DVT prophylaxis.  The patient is on IV heparin for his ACS.  8.  Hypotension- normal saline bolus 500 mL.   Blood pressure improved and stable.   All the records are reviewed and case discussed with Care Management/Social Workerr. Management plans discussed with the patient, family and they are in agreement.  CODE STATUS: Full code  TOTAL TIME TAKING CARE OF THIS PATIENT: 35 minutes.   POSSIBLE D/C IN 2-3 DAYS, DEPENDING ON CLINICAL CONDITION.   Vaughan Basta M.D on 11/18/2018   Between 7am to 6pm - Pager - [phone]  After 6pm go to [email or web address] - password [email or web address] [facility]  Office  [phone]  CC: Primary care physician;  Donnie Coffin, MD  Note: This dictation was prepared with Dragon dictation along with smaller phrase technology. Any transcriptional errors that result from this process are unintentional.

## 2018-11-18 NOTE — Progress Notes (Signed)
ANTICOAGULATION CONSULT NOTE - Initial Consult  Pharmacy Consult for heparin Indication: chest pain/ACS  Allergies  Allergen Reactions  . Dust Mite Extract Other (See Comments)    Reaction: unknown    Patient Measurements: Height: 6\' 2"  (188 cm) Weight: 204 lb 14.4 oz (92.9 kg) IBW/kg (Calculated) : 82.2 Heparin Dosing Weight: 96 kg  Vital Signs: Temp: 98.4 F (36.9 C) (06/24 1605) Temp Source: Oral (06/24 1605) BP: 72/51 (06/24 1606) Pulse Rate: 79 (06/24 1606)  Labs: Recent Labs    11/15/18 2117  11/16/18 0537 11/16/18 1639 11/17/18 0447 11/18/18 0728 11/18/18 1327 11/18/18 1950  HGB 14.5  --  14.3  --  13.4 13.5  --   --   HCT 45.8  --  44.4  --  41.4 41.9  --   --   PLT 142*  --  146*  --  129* 140*  --   --   APTT 31  --   --   --   --   --   --   --   LABPROT 13.0  --   --   --   --   --   --   --   INR 1.0  --   --   --   --   --   --   --   HEPARINUNFRC  --    < > 0.35 0.51 0.32 0.18* 0.47 0.36  CREATININE 10.60*  --  11.17*  --   --  11.79*  --   --   TROPONINI 0.96*  --   --  8.28*  --   --   --   --    < > = values in this interval not displayed.    Estimated Creatinine Clearance: 7.4 mL/min (A) (by C-G formula based on SCr of 11.79 mg/dL (H)).   Medical History: Past Medical History:  Diagnosis Date  . 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 Amputee, below knee, left ([place])   . Anemia   . Asthma   . Cardiomyopathy ([place])   . CHF (congestive heart failure) ([place])   . Chronic systolic heart failure ([place])   . Complication of anesthesia    hypotension  . COPD (chronic obstructive pulmonary disease) ([place])   . Coronary artery disease   . Dialysis patient ([place])    Mon, Wed, Fri  . End stage renal disease ([place])   . GERD (gastroesophageal reflux disease)   . Headache   . History of kidney stones   . History of pulmonary embolism   . HLD (hyperlipidemia)   . HTN (hypertension)   . Hyperparathyroidism   . Myocardial infarction ([place])    . Peripheral vascular disease ([place])   . Shortness of breath dyspnea   . Sleep apnea    NO C-PAP, Patient stated in process of  "getting one"   . Tobacco dependence     Medications:  Scheduled:  . aspirin EC  325 mg Oral Daily  . atorvastatin  10 mg Oral QHS  . Chlorhexidine Gluconate Cloth  6 each Topical Q0600  . Chlorhexidine Gluconate Cloth  6 each Topical Q0600  . diclofenac sodium  2 g Topical QID  . erythromycin  1 application Right Eye QHS  . fluticasone  1 spray Each Nare Daily  . loratadine  10 mg Oral Daily  . midodrine  10 mg Oral TID WC  . mometasone-formoterol  2 puff Inhalation BID  . multivitamin with  minerals  1 tablet Oral BID  . mupirocin ointment  1 application Nasal BID  . pantoprazole  40 mg Oral Daily  . pregabalin  75 mg Oral Daily  . sevelamer carbonate  800 mg Oral TID WC  . sodium chloride flush  3 mL Intravenous Q12H  . tiotropium  18 mcg Inhalation Daily    Assessment: Patient admitted for LS CP and fluid overload as evidenced by cardiomegaly on CXR. Patient has a h/o ESRD on HD MWF. BNP 2274, initial trop of 0.96; patient was here back in 04/26 w/ trops of 0.12 >> 0.11. EKG showing LBBB. Patient is not on anticoagulation PTA.  Patient is being started on heparin for probable NSTEMI.  6/22 Heparin infusion started @ 1350 units/hr 06/22 @ 0530 HL 0.35 therapeutic. 6/22@1703  HL 0.51 therapeutic x2. 6/23 @0447  HL 0.32  6/24 @0728  HL 0.18 2500 bolus x 1 and increase rate to   f 1600 units/hr 6/24 @1327  HL 0.47  6/24 @ 1950 HL 0.36 Confirmed with nurse, no interruptions of the heparin gtt. Of note, patient will likely go to cardiac cath tomorrow.  Goal of Therapy:  Heparin level 0.3-0.7 units/ml Monitor platelets by anticoagulation protocol: Yes   Plan:  Heparin level is therapeutic x 2. Will continue current rate. CBC stable, will continue to monitor. Will follow daily HL with AM labs.   Per protocol HL and CBC ordered with AM labs daily while  on heparin infusion.   Rowland Lathe, PharmD Clinical Pharmacist 11/18/2018 8:18 PM

## 2018-11-18 NOTE — Progress Notes (Signed)
Talked to Dr. Anselm Jungling about patient's BP of 88/70 when he was in dialysis, he has scheduled midodrine which I will give. Also let him know that his cardiac cath get cancelled because he was in dialysis and cathlab is tight with their scheduled so they will re-schedule them tomorrow.

## 2018-11-18 NOTE — Progress Notes (Signed)
Notify Dr. Estanislado Pandy about patient's BP at 84/57, bp has been soft throughout the day. He received midodrine at 1657, and he had hemodialysis today. Patient is asymptomatic.

## 2018-11-18 NOTE — Progress Notes (Signed)
Pre HD Assessment    11/18/18 1140  Neurological  Level of Consciousness Alert  Orientation Level Oriented X4  Respiratory  Respiratory Pattern Regular  Chest Assessment Chest expansion symmetrical  Bilateral Breath Sounds Diminished  Cardiac  Pulse Regular  ECG Monitor Yes  Cardiac Rhythm NSR;BBB  Ectopy Multifocal PVC's  Ectopy Frequency Occasional  Vascular  R Radial Pulse +2  L Radial Pulse +2  Integumentary  Integumentary (WDL) X  Skin Color Appropriate for ethnicity  Skin Condition Dry;Flaky  Skin Integrity Amputation  Additional Integumentary Comments  (HD pt)  Musculoskeletal  Musculoskeletal (WDL) X  Generalized Weakness Yes  Gastrointestinal  Bowel Sounds Assessment Active  GU Assessment  Genitourinary (WDL) X  Genitourinary Symptoms Anuria (HD pt)  Psychosocial  Psychosocial (WDL) WDL

## 2018-11-18 NOTE — Progress Notes (Signed)
[facility], [place] 11/18/18  Subjective:   LOS: 3 06/23 0701 - 06/24 0700 In: 864 [P.O.:360; I.V.:504] Out: 0  Patient presented to the emergency room from the [facility] via EMS for left-sided chest pain which has been going on for 2 to 3 days.  He reports radiation down left arm, shoulder, left lower chest.  Denies any nausea, vomiting, diarrhea.  Chest x-ray showed cardiomegaly with vascular congestion, mild interstitial diffuse edema and small pleural effusions.  Suspicious for pneumonia.  His blood pressure has been low Patient normally dialyzes at [facility] MWF    HEMODIALYSIS FLOWSHEET:  Blood Flow Rate (mL/min): 200 mL/min Arterial Pressure (mmHg): -90 mmHg Venous Pressure (mmHg): 100 mmHg Transmembrane Pressure (mmHg): 30 mmHg Ultrafiltration Rate (mL/min): 0 mL/min Dialysate Flow Rate (mL/min): 600 ml/min Conductivity: Machine : 13.6 Conductivity: Machine : 13.6 Dialysis Fluid Bolus: Normal Saline Bolus Amount (mL): 250 mL Dialysate Change: Other (comment)  Blood pressure was low normal.  Patient given midodrine to support blood pressure    Objective:  Vital signs in last 24 hours:  Temp:  [98.4 F (36.9 C)-100 F (37.8 C)] 98.4 F (36.9 C) (06/24 1605) Pulse Rate:  [40-90] 79 (06/24 1606) Resp:  [15-23] 19 (06/24 1605) BP: (54-113)/(37-77) 72/51 (06/24 1606) SpO2:  [93 %-100 %] 100 % (06/24 1605) Weight:  [92.9 kg-96.2 kg] 92.9 kg (06/24 1605)  Weight change: 0.2 kg Filed Weights   11/17/18 0500 11/18/18 0455 11/18/18 1605  Weight: 96.1 kg 96.2 kg 92.9 kg    Intake/Output:    Intake/Output Summary (Last 24 hours) at 11/18/2018 1611 Last data filed at 11/18/2018 1500 Gross per 24 hour  Intake 273 ml  Output 27 ml  Net 246 ml     Physical Exam: General:  Chronically ill appearing gentleman, laying in the bed  HEENT  anicteric, moist oral mucous membranes  Neck  supple, no masses  Pulm/lungs  Normal effort, clear to auscultation, [place] O2  CVS/Heart  no rub  Abdomen:   Soft, nontender  Extremities:  Left BKA, trace edema on the right  Neurologic:  Resting quietly  Skin:  No acute rashes  Access:  Left arm AV fistula,        Basic Metabolic Panel:  Recent Labs  Lab 11/15/18 2117 11/16/18 0537 11/16/18 1639 11/18/18 0728  NA 143 140  --  138  K 5.3* 5.2*  --  5.8*  CL 101 100  --  100  CO2 27 26  --  24  GLUCOSE 121* 110*  --  98  BUN 62* 67*  --  77*  CREATININE 10.60* 11.17*  --  11.79*  CALCIUM 10.1 9.6  --  9.7  PHOS  --   --  4.6  --      CBC: Recent Labs  Lab 11/15/18 2117 11/16/18 0537 11/17/18 0447 11/18/18 0728  WBC 9.5 10.3 8.8 10.6*  NEUTROABS 6.0  --   --   --   HGB 14.5 14.3 13.4 13.5  HCT 45.8 44.4 41.4 41.9  MCV 91.1 89.9 90.8 90.5  PLT 142* 146* 129* 140*      Lab Results  Component Value Date   HEPBSAG Negative 09/18/2017   HEPBSAB Non Reactive 05/22/2016      Microbiology:  Recent Results (from the past 240 hour(s))  Blood culture (routine x 2)     Status: None (Preliminary result)   Collection Time: 11/15/18  9:18 PM   Specimen: BLOOD  Result Value Ref Range Status   Specimen Description BLOOD RIGHT ANTECUBITAL  Final   Special Requests   Final    BOTTLES DRAWN AEROBIC AND ANAEROBIC Blood Culture adequate volume   Culture   Final    NO GROWTH 3 DAYS Performed at [facility], [address] [place], [place] [address]    Report Status PENDING  Incomplete  Blood culture (routine x 2)     Status: None (Preliminary result)   Collection Time: 11/15/18  9:19 PM   Specimen: BLOOD  Result Value Ref Range Status   Specimen Description BLOOD BLOOD RIGHT HAND  Final   Special Requests   Final    BOTTLES DRAWN AEROBIC AND ANAEROBIC Blood Culture adequate volume   Culture   Final    NO GROWTH 3 DAYS Performed at [facility], [address] [place], [place] [address]    Report Status PENDING  Incomplete   SARS Coronavirus 2 (CEPHEID - Performed in [place] hospital lab), Hosp Order     Status: None   Collection Time: 11/15/18  9:35 PM   Specimen: Nasopharyngeal Swab  Result Value Ref Range Status   SARS Coronavirus 2 NEGATIVE NEGATIVE Final    Comment: (NOTE) If result is NEGATIVE SARS-CoV-2 target nucleic acids are NOT DETECTED. The SARS-CoV-2 RNA is generally detectable in upper and lower  respiratory specimens during the acute phase of infection. The lowest  concentration of SARS-CoV-2 viral copies this assay can detect is 250  copies / mL. A negative result does not preclude SARS-CoV-2 infection  and should not be used as the sole basis for treatment or other  patient management decisions.  A negative result may occur with  improper specimen collection / handling, submission of specimen other  than nasopharyngeal swab, presence of viral mutation(s) within the  areas targeted by this assay, and inadequate number of viral copies  (<250 copies / mL). A negative result must be combined with clinical  observations, patient history, and epidemiological information. If result is POSITIVE SARS-CoV-2 target nucleic acids are DETECTED. The SARS-CoV-2 RNA is generally detectable in upper and lower  respiratory specimens dur ing the acute phase of infection.  Positive  results are indicative of active infection with SARS-CoV-2.  Clinical  correlation with patient history and other diagnostic information is  necessary to determine patient infection status.  Positive results do  not rule out bacterial infection or co-infection with other viruses. If result is PRESUMPTIVE POSTIVE SARS-CoV-2 nucleic acids MAY BE PRESENT.   A presumptive positive result was obtained on the submitted specimen  and confirmed on repeat testing.  While 2019 novel coronavirus  (SARS-CoV-2) nucleic acids may be present in the submitted sample  additional confirmatory testing may be necessary for epidemiological   and / or clinical management purposes  to differentiate between  SARS-CoV-2 and other Sarbecovirus currently known to infect humans.  If clinically indicated additional testing with an alternate test  methodology [phone]) is advised. The SARS-CoV-2 RNA is generally  detectable in upper and lower respiratory sp ecimens during the acute  phase of infection. The expected result is Negative. Fact Sheet for Patients:  StrictlyIdeas.no Fact Sheet for Healthcare Providers: BankingDealers.co.za This test is not yet approved or cleared by the [place] FDA and has been authorized for detection and/or diagnosis of SARS-CoV-2 by FDA under an Emergency Use Authorization (EUA).  This EUA will remain in effect (meaning this test can be used) for the duration of the COVID-19  declaration under Section 564(b)(1) of the Act, 21 U.S.C. section 360bbb-3(b)(1), unless the authorization is terminated or revoked sooner. Performed at [facility], [address] [place], [place] [address]   MRSA PCR Screening     Status: Abnormal   Collection Time: 11/16/18  9:39 AM   Specimen: Nasopharyngeal  Result Value Ref Range Status   MRSA by PCR POSITIVE (A) NEGATIVE Final    Comment:        The GeneXpert MRSA Assay (FDA approved for NASAL specimens only), is one component of a comprehensive MRSA colonization surveillance program. It is not intended to diagnose MRSA infection nor to guide or monitor treatment for MRSA infections. RESULT CALLED TO, READ BACK BY AND VERIFIED WITH: JAMIE DEEM @1150  11/16/18 AKT Performed at [facility], [address] [place],  [address]     Coagulation Studies: Recent Labs    11/15/18 08-25-15  LABPROT 13.0  INR 1.0    Urinalysis: No results for input(s): COLORURINE, LABSPEC, PHURINE, GLUCOSEU, HGBUR, BILIRUBINUR, KETONESUR, PROTEINUR, UROBILINOGEN, NITRITE, LEUKOCYTESUR in the last 72  hours.  Invalid input(s): APPERANCEUR    Imaging: Dg Chest 1 View  Result Date: 11/17/2018 CLINICAL DATA:  Fever, history coronary artery disease post MI, hypertension, CHF, COPD, smoker EXAM: CHEST  1 VIEW COMPARISON:  Portable exam 1001 hours compared to 11/15/2018 FINDINGS: Enlargement of cardiac silhouette with pulmonary vascular congestion. Atherosclerotic calcification aorta. Small bibasilar pleural effusions and minimal atelectasis. Minimal pulmonary edema, slightly improved. No pneumothorax. RIGHT axillary stent. IMPRESSION: Enlargement of cardiac silhouette with pulmonary vascular congestion and minimal pulmonary edema, slightly improved. Small bibasilar pleural effusions and atelectasis. Electronically Signed   By: Lavonia Dana M.D.   On: 11/17/2018 10:28     Medications:   . heparin 1,600 Units/hr (11/18/18 0837)   . aspirin EC  325 mg Oral Daily  . atorvastatin  10 mg Oral QHS  . Chlorhexidine Gluconate Cloth  6 each Topical Q0600  . Chlorhexidine Gluconate Cloth  6 each Topical Q0600  . diclofenac sodium  2 g Topical QID  . erythromycin  1 application Right Eye QHS  . fluticasone  1 spray Each Nare Daily  . loratadine  10 mg Oral Daily  . midodrine  10 mg Oral TID WC  . mometasone-formoterol  2 puff Inhalation BID  . multivitamin with minerals  1 tablet Oral BID  . mupirocin ointment  1 application Nasal BID  . pantoprazole  40 mg Oral Daily  . pregabalin  75 mg Oral Daily  . sevelamer carbonate  800 mg Oral TID WC  . sodium chloride flush  3 mL Intravenous Q12H  . tiotropium  18 mcg Inhalation Daily   acetaminophen, albuterol, docusate sodium, guaiFENesin, ipratropium-albuterol, morphine injection, nitroGLYCERIN, ondansetron (ZOFRAN) IV, polyethylene glycol, traMADol  Assessment/ Plan:  63 y.o. African-American male with ESRD on hemodialysis, hypertension, hyperlipidemia, anemia, COPD, peripheral vascular disease, left BKA, right toe amputations, severe coronary  artery disease, chronic systolic CHF with EF of 70%.  From 2018-08-25 MWF [facility] Nephrology [facility].  Active Problems:   NSTEMI (non-ST elevated myocardial infarction) ([place])   Fluid overload   #.  End-stage renal disease.  MWF dialysis Recent Labs    11/15/18 08-25-15 11/16/18 0537 11/18/18 0728  CREATININE 10.60* 11.17* 11.79*     #. Anemia of CKD  Lab Results  Component Value Date   HGB 13.5 11/18/2018  Monitor Hold off Epogen  #. [facility]     Component Value Date/Time  PTH 246 (H) 08/12/2018 1417   Lab Results  Component Value Date   PHOS 4.6 11/16/2018  Continue sevelamer  #Left-sided chest pain, non-STEMI with elevated troponin, Acute on Ch Systolic CHF Currently being monitored in ICU stepdown Cardiology evaluation ongoing.  2D echo 6/22 shows severely depressed LVEF to 20-25%, severely reduced rt ventricle systolic function  #Mild hyperkalemia Expected to improve with hemodialysis    LOS: [place] 6/24/20204:11 [place], [place]

## 2018-11-18 NOTE — Progress Notes (Signed)
Post HD Treatment:  No fluid removal d/t hypotensive episode, did not meet MD UF goal of 500 mL. BP stable post tx.   11/18/18 1500  Vital Signs  Temp 98.7 F (37.1 C)  Temp Source Oral  Pulse Rate 76  Pulse Rate Source Monitor  Resp 20  BP (!) 88/70  Oxygen Therapy  SpO2 100 %  Post-Hemodialysis Assessment  Rinseback Volume (mL) 250 mL  Dialyzer Clearance Lightly streaked  Duration of HD Treatment -hour(s) 3 hour(s)  Hemodialysis Intake (mL) 500 mL  UF Total -Machine (mL) 527 mL  Net UF (mL) 27 mL  Tolerated HD Treatment Yes  AVG/AVF Arterial Site Held (minutes) 10 minutes  AVG/AVF Venous Site Held (minutes) 10 minutes  Fistula / Graft Left Upper arm Arteriovenous fistula  No Placement Date or Time found.   Placed prior to admission: Yes  Orientation: Left  Access Location: Upper arm  Access Type: Arteriovenous fistula  Site Condition No complications  Fistula / Graft Assessment Thrill;Bruit  Status Deaccessed  Drainage Description None

## 2018-11-18 NOTE — Progress Notes (Signed)
HD Tx Start   11/18/18 1145  Hand-Off documentation  Report given to (Full Name) Trellis Paganini RN  Report received from (Full Name) Soumougngoun RN 2A  Vital Signs  Temp 98.8 F (37.1 C)  Temp Source Oral  Pulse Rate 75  Pulse Rate Source Monitor  Resp 19  BP (!) 85/54  BP Location Right Arm  BP Method Automatic  Patient Position (if appropriate) Lying  Oxygen Therapy  SpO2 99 %  O2 Device Nasal Cannula  O2 Flow Rate (L/min) 3 L/min  Pain Assessment  Pain Scale 0-10  Pain Score 0  During Hemodialysis Assessment  Blood Flow Rate (mL/min) 400 mL/min  Arterial Pressure (mmHg) -140 mmHg  Venous Pressure (mmHg) 200 mmHg  Transmembrane Pressure (mmHg) 60 mmHg  Ultrafiltration Rate (mL/min) 330 mL/min  Dialysate Flow Rate (mL/min) 800 ml/min  Conductivity: Machine  14  HD Safety Checks Performed Yes  Dialysis Fluid Bolus Normal Saline  Bolus Amount (mL) 250 mL  Intra-Hemodialysis Comments Tx initiated  Education / Care Plan  Dialysis Education Provided Yes  Documented Education in Care Plan Yes  Fistula / Graft Left Upper arm Arteriovenous fistula  No Placement Date or Time found.   Placed prior to admission: Yes  Orientation: Left  Access Location: Upper arm  Access Type: Arteriovenous fistula  Site Condition No complications  Fistula / Graft Assessment Thrill;Bruit  Status Accessed  Needle Size 15  Drainage Description None

## 2018-11-18 NOTE — TOC Initial Note (Signed)
Transition of Care [facility]) - Initial/Assessment Note    Patient Details  Name: [name] MRN: [id number] Date of Birth: Feb 27, 1956  Transition of [facility]) CM/SW Contact:    Latanya Maudlin, RN Phone Number: 11/18/2018, 8:28 AM  Clinical Narrative:  [facility] team consulted to complete high risk readmission assessment. Patient transferred to 2A after spending time in ICU. Patient is from [facility] assisted living. He recently closed with [facility] home health on 6/1. Has access to a rolling walker at facility. Patient also on chronic oxygen but he is unsure with what agency. Daughter, [name] [phone] is main contact person. Patient is on hemodialysis M,W,F. PCP is [facility]. Facility provides transportation.                 Expected Discharge Plan: Assisted Living     Patient Goals and CMS Choice Patient states their goals for this hospitalization and ongoing recovery are:: i got to get better CMS Medicare.gov Compare Post Acute Care list provided to:: Patient    Expected Discharge Plan and Services Expected Discharge Plan: Assisted Living       Living arrangements for the past 2 months: [place]                                      Prior Living Arrangements/Services Living arrangements for the past 2 months: [place] Lives with:: Facility Resident Patient language and need for interpreter reviewed:: Yes Do you feel safe going back to the place where you live?: Yes      Need for Family Participation in Patient Care: No (Comment)   Current home services: DME Criminal Activity/Legal Involvement Pertinent to Current Situation/Hospitalization: No - Comment as needed  Activities of Daily Living Home Assistive Devices/Equipment: Cane (specify quad or straight), Prosthesis ADL Screening (condition at time of admission) Patient's cognitive ability adequate to safely complete daily activities?: Yes Is the patient deaf or have difficulty  hearing?: No Does the patient have difficulty seeing, even when wearing glasses/contacts?: No Does the patient have difficulty concentrating, remembering, or making decisions?: No Patient able to express need for assistance with ADLs?: Yes Does the patient have difficulty dressing or bathing?: No Independently performs ADLs?: Yes (appropriate for developmental age) Does the patient have difficulty walking or climbing stairs?: Yes Weakness of Legs: None Weakness of Arms/Hands: None  Permission Sought/Granted Permission sought to share information with : Case Manager                Emotional Assessment Appearance:: Appears stated age Attitude/Demeanor/Rapport: Engaged Affect (typically observed): Quiet Orientation: : Oriented to Self, Oriented to Place, Oriented to  Time, Oriented to Situation      Admission diagnosis:  Chest pain [R07.9] Chest pain in adult [R07.9] NSTEMI (non-ST elevated myocardial infarction) [facility]) [I21.4] Patient Active Problem List   Diagnosis Date Noted  . Fluid overload 11/15/2018  . Non-STEMI (non-ST elevated myocardial infarction) ([place]) 08/10/2018  . Acute GI bleeding   . HCAP (healthcare-associated pneumonia) 04/22/2018  . Acute on chronic respiratory failure with hypoxia ([place]) 12/21/2017  . Tobacco use 10/07/2017  . Apnea   . End-stage renal disease on hemodialysis ([place])   . Hypervolemia   . Altered mental status   . Acute on chronic systolic CHF (congestive heart failure) ([place]) 01/18/2017  . Pulmonary embolism ([place]) 08/03/2016  . NSTEMI (non-ST elevated myocardial infarction) ([place]) 07/26/2016  . PVD (  peripheral vascular disease) ([place]) 03/05/2016  . Preop examination 05/02/2015  . Chronic systolic heart failure ([place])   . Hyperkalemia 01/13/2015  . Normocytic anemia 05/04/2014  . Hyperlipidemia 04/26/2010  . HYPERTENSION, BENIGN 04/26/2010  . CAD, NATIVE VESSEL 04/26/2010   PCP:  Donnie Coffin, MD Pharmacy:   [place], [place] [address] Phone: [phone] Fax: [phone]     Social Determinants of Health (SDOH) Interventions    Readmission Risk Interventions Readmission Risk Prevention Plan 11/18/2018 08/18/2018 04/24/2018  Transportation Screening Complete Complete Complete  HRI or Home Care Consult Complete - -  Palliative Care Screening Not Applicable - -  Medication Review (RN Care Manager) Complete Complete Complete  PCP or Specialist appointment within 3-5 days of discharge - Complete Complete  HRI or Home Care Consult - Complete Complete  SW Recovery Care/Counseling Consult - Complete Complete  Palliative Care Screening - Not Complete Not Complete  Comments - Was not ordered -  Medication Reconcilation (Pharmacy) - - Complete  Skilled Nursing Facility - Not Applicable Not Complete  Some recent data might be hidden

## 2018-11-18 NOTE — Progress Notes (Signed)
[place] [facility] [place] NAME: [name]    MR#:  [id number]  DATE OF BIRTH:  1955/10/22  SUBJECTIVE:  CHIEF COMPLAINT:   Chief Complaint  Patient presents with  . Chest Pain  . Shortness of Breath   Admitted with chest pain and shortness of breath.  Noted to have elevated troponin.  Also noted to have low blood pressures in ER in stepdown unit. Patient still have chest pain on heparin drip.  Seen by cardiologist and plan is tentatively to do cardiac catheterization.  Received hemodialysis per nephrology. REVIEW OF SYSTEMS:  CONSTITUTIONAL: No fever, fatigue or weakness.  EYES: No blurred or double vision.  EARS, NOSE, AND THROAT: No tinnitus or ear pain.  RESPIRATORY: No cough, shortness of breath, wheezing or hemoptysis.  CARDIOVASCULAR: Have chest pain, no orthopnea, edema.  GASTROINTESTINAL: No nausea, vomiting, diarrhea or abdominal pain.  GENITOURINARY: No dysuria, hematuria.  ENDOCRINE: No polyuria, nocturia,  HEMATOLOGY: No anemia, easy bruising or bleeding SKIN: No rash or lesion. MUSCULOSKELETAL: No joint pain or arthritis.   NEUROLOGIC: No tingling, numbness, weakness.  PSYCHIATRY: No anxiety or depression.   ROS  DRUG ALLERGIES:   Allergies  Allergen Reactions  . Dust Mite Extract Other (See Comments)    Reaction: unknown    VITALS:  Blood pressure (!) 72/51, pulse 79, temperature 98.4 F (36.9 C), temperature source Oral, resp. rate 19, height 6\' 2"  (1.88 m), weight 92.9 kg, SpO2 100 %.  PHYSICAL EXAMINATION:  GENERAL:  63 y.o.-year-old patient lying in the bed with acute distress due to chest pain.  EYES: Pupils equal, round, reactive to light and accommodation. No scleral icterus. Extraocular muscles intact.  HEENT: Head atraumatic, normocephalic. Oropharynx and nasopharynx clear.  NECK:  Supple, no jugular venous distention. No thyroid enlargement, no tenderness.  LUNGS: Normal breath sounds bilaterally, no  wheezing, have crepitation. No use of accessory muscles of respiration.  CARDIOVASCULAR: S1, S2 normal. No murmurs, rubs, or gallops.  ABDOMEN: Soft, nontender, nondistended. Bowel sounds present. No organomegaly or mass.  EXTREMITIES: No pedal edema, cyanosis, or clubbing.  NEUROLOGIC: Cranial nerves II through XII are intact. Muscle strength 4/5 in all extremities. Sensation intact. Gait not checked.  PSYCHIATRIC: The patient is alert and oriented x 3.  SKIN: No obvious rash, lesion, or ulcer.   Physical Exam LABORATORY PANEL:   CBC Recent Labs  Lab 11/18/18 0728  WBC 10.6*  HGB 13.5  HCT 41.9  PLT 140*   ------------------------------------------------------------------------------------------------------------------  Chemistries  Recent Labs  Lab 11/15/18 2117  11/18/18 0728  NA 143   < > 138  K 5.3*   < > 5.8*  CL 101   < > 100  CO2 27   < > 24  GLUCOSE 121*   < > 98  BUN 62*   < > 77*  CREATININE 10.60*   < > 11.79*  CALCIUM 10.1   < > 9.7  AST 34  --   --   ALT 34  --   --   ALKPHOS 139*  --   --   BILITOT 0.6  --   --    < > = values in this interval not displayed.   ------------------------------------------------------------------------------------------------------------------  Cardiac Enzymes Recent Labs  Lab 11/15/18 2117 11/16/18 1639  TROPONINI 0.96* 8.28*   ------------------------------------------------------------------------------------------------------------------  RADIOLOGY:  Dg Chest 1 View  Result Date: 11/17/2018 CLINICAL DATA:  Fever, history coronary artery disease post MI, hypertension, CHF, COPD, smoker EXAM:  CHEST  1 VIEW COMPARISON:  Portable exam 1001 hours compared to 11/15/2018 FINDINGS: Enlargement of cardiac silhouette with pulmonary vascular congestion. Atherosclerotic calcification aorta. Small bibasilar pleural effusions and minimal atelectasis. Minimal pulmonary edema, slightly improved. No pneumothorax. RIGHT axillary  stent. IMPRESSION: Enlargement of cardiac silhouette with pulmonary vascular congestion and minimal pulmonary edema, slightly improved. Small bibasilar pleural effusions and atelectasis. Electronically Signed   By: Lavonia Dana M.D.   On: 11/17/2018 10:28    ASSESSMENT AND PLAN:   Active Problems:   NSTEMI (non-ST elevated myocardial infarction) (HCC)   Fluid overload  1.  Non-STEMI.   stepdown unit.    Remains stable.  on aspirin and IV heparin.   statin therapy  on PRN IV morphine sulfate and sublingual nitroglycerin.  With stabilize blood pressure Nitropaste can be added. Hold beta-blockers due to low blood pressure.  2D echo and a cardiology consultation-plan for cardiac cath.  2.  Acute on chronic systolic CHF.  Most recent EF was 20 to 25% in  mildly dilated left and right atria with trivial aortic regurgitation.  The patient will be diuresed with IV Lasix.  Nephrology consultation obtained for hemodialysis.  Received hemodialysis.  3.  End-stage renal disease on hemodialysis.  Nephrology consult continue his Renvela.  4.  Dyslipidemia.  Statin therapy will be resumed.  5.  GERD.  PPI therapy will be resumed.  6.  COPD.  We will continue Spiriva and place the patient on PRN duo nebs.   7.  DVT prophylaxis.  The patient will be on IV heparin for his ACS.  8.  Hypotension- normal saline bolus 500 mL.  Notified cardiologist and intensivist about this.  Blood pressure improved and stable.   All the records are reviewed and case discussed with Care Management/Social Workerr. Management plans discussed with the patient, family and they are in agreement.  CODE STATUS: Full code  TOTAL TIME TAKING CARE OF THIS PATIENT: 35 minutes.   POSSIBLE D/C IN 2-3 DAYS, DEPENDING ON CLINICAL CONDITION.   Vaughan Basta M.D on 11/18/2018   Between 7am to 6pm - Pager - [phone]  After 6pm go to [email or web address] - password [email or web address] [facility]  Office   [phone]  CC: Primary care physician; Donnie Coffin, MD  Note: This dictation was prepared with Dragon dictation along with smaller phrase technology. Any transcriptional errors that result from this process are unintentional.

## 2018-11-18 NOTE — Progress Notes (Signed)
ANTICOAGULATION CONSULT NOTE - Initial Consult  Pharmacy Consult for heparin Indication: chest pain/ACS  Allergies  Allergen Reactions  . Dust Mite Extract Other (See Comments)    Reaction: unknown    Patient Measurements: Height: 6\' 2"  (188 cm) Weight: 212 lb 1.3 oz (96.2 kg) IBW/kg (Calculated) : 82.2 Heparin Dosing Weight: 96 kg  Vital Signs: Temp: 98.8 F (37.1 C) (06/24 1145) Temp Source: Oral (06/24 1145) BP: 98/67 (06/24 1432) Pulse Rate: 79 (06/24 1432)  Labs: Recent Labs    11/15/18 2117  11/16/18 0537 11/16/18 1639 11/17/18 0447 11/18/18 0728 11/18/18 1327  HGB 14.5  --  14.3  --  13.4 13.5  --   HCT 45.8  --  44.4  --  41.4 41.9  --   PLT 142*  --  146*  --  129* 140*  --   APTT 31  --   --   --   --   --   --   LABPROT 13.0  --   --   --   --   --   --   INR 1.0  --   --   --   --   --   --   HEPARINUNFRC  --    < > 0.35 0.51 0.32 0.18* 0.47  CREATININE 10.60*  --  11.17*  --   --  11.79*  --   TROPONINI 0.96*  --   --  8.28*  --   --   --    < > = values in this interval not displayed.    Estimated Creatinine Clearance: 7.4 mL/min (A) (by C-G formula based on SCr of 11.79 mg/dL (H)).   Medical History: Past Medical History:  Diagnosis Date  . 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 Amputee, below knee, left ([place])   . Anemia   . Asthma   . Cardiomyopathy ([place])   . CHF (congestive heart failure) ([place])   . Chronic systolic heart failure ([place])   . Complication of anesthesia    hypotension  . COPD (chronic obstructive pulmonary disease) ([place])   . Coronary artery disease   . Dialysis patient ([place])    Mon, Wed, Fri  . End stage renal disease ([place])   . GERD (gastroesophageal reflux disease)   . Headache   . History of kidney stones   . History of pulmonary embolism   . HLD (hyperlipidemia)   . HTN (hypertension)   . Hyperparathyroidism   . Myocardial infarction ([place])   . Peripheral vascular disease ([place])   . Shortness of  breath dyspnea   . Sleep apnea    NO C-PAP, Patient stated in process of  "getting one"   . Tobacco dependence     Medications:  Scheduled:  . aspirin EC  325 mg Oral Daily  . atorvastatin  10 mg Oral QHS  . Chlorhexidine Gluconate Cloth  6 each Topical Q0600  . Chlorhexidine Gluconate Cloth  6 each Topical Q0600  . diclofenac sodium  2 g Topical QID  . erythromycin  1 application Right Eye QHS  . fluticasone  1 spray Each Nare Daily  . loratadine  10 mg Oral Daily  . midodrine  10 mg Oral TID WC  . mometasone-formoterol  2 puff Inhalation BID  . multivitamin with minerals  1 tablet Oral BID  . mupirocin ointment  1 application Nasal BID  . pantoprazole  40 mg Oral Daily  . pregabalin  75 mg Oral Daily  . sevelamer carbonate  800 mg Oral TID WC  . sodium chloride flush  3 mL Intravenous Q12H  . tiotropium  18 mcg Inhalation Daily    Assessment: Patient admitted for LS CP and fluid overload as evidenced by cardiomegaly on CXR. Patient has a h/o ESRD on HD MWF. BNP 2274, initial trop of 0.96; patient was here back in 04/26 w/ trops of 0.12 >> 0.11. EKG showing LBBB. Patient is not on anticoagulation PTA.  Patient is being started on heparin for probable NSTEMI.  6/22 Heparin infusion started @ 1350 units/hr 06/22 @ 0530 HL 0.35 therapeutic. 6/22@1703  HL 0.51 therapeutic x2. 6/23 @0447  HL 0.32  6/24 @0728  HL 0.18 2500 bolus x 1 and increase rate to   f 1600 units/hr 6/24 @1327  HL 0.47   Goal of Therapy:  Heparin level 0.3-0.7 units/ml Monitor platelets by anticoagulation protocol: Yes   Plan:  Heparin level is therapeutic x 1. Will continue current rate.  CBC stable, will continue to monitor. Will order a heparin level in 6 hours.   Per protocol HL and CBC ordered with AM labs daily while on heparin infusion.   Oswald Hillock, PharmD, BCPS Clinical Pharmacist 11/18/2018 3:20 PM

## 2018-11-18 NOTE — Progress Notes (Signed)
Patient is at dialysis at this time .

## 2018-11-19 ENCOUNTER — Encounter
Admission: EM | Disposition: A | Discharge status: Hospice | Payer: Self-pay | Source: Home / Self Care | Attending: Internal Medicine

## 2018-11-19 ENCOUNTER — Inpatient Hospital Stay: Payer: Medicaid Other

## 2018-11-19 ENCOUNTER — Encounter: Payer: Self-pay | Admitting: Internal Medicine

## 2018-11-19 DIAGNOSIS — Z515 Encounter for palliative care: Secondary | ICD-10-CM

## 2018-11-19 DIAGNOSIS — Z7189 Other specified counseling: Secondary | ICD-10-CM

## 2018-11-19 HISTORY — PX: LEFT HEART CATH AND CORONARY ANGIOGRAPHY: CATH118249

## 2018-11-19 LAB — BASIC METABOLIC PANEL
Anion gap: 15 (ref 5–15)
BUN: 52 mg/dL — ABNORMAL HIGH (ref 8–23)
CO2: 26 mmol/L (ref 22–32)
Calcium: 9.4 mg/dL (ref 8.9–10.3)
Chloride: 98 mmol/L (ref 98–111)
Creatinine, Ser: 8.63 mg/dL — ABNORMAL HIGH (ref 0.61–1.24)
GFR calc Af Amer: 7 mL/min — ABNORMAL LOW (ref >60)
GFR calc non Af Amer: 6 mL/min — ABNORMAL LOW (ref >60)
Glucose, Bld: 91 mg/dL (ref 70–99)
Potassium: 4.5 mmol/L (ref 3.5–5.1)
Sodium: 139 mmol/L (ref 135–145)

## 2018-11-19 LAB — CBC
HCT: 40 % (ref 39.0–52.0)
Hemoglobin: 12.7 g/dL — ABNORMAL LOW (ref 13.0–17.0)
MCH: 28.9 pg (ref 26.0–34.0)
MCHC: 31.8 g/dL (ref 30.0–36.0)
MCV: 91.1 fL (ref 80.0–100.0)
Platelets: 154 10*3/uL (ref 150–400)
RBC: 4.39 MIL/uL (ref 4.22–5.81)
RDW: 16.4 % — ABNORMAL HIGH (ref 11.5–15.5)
WBC: 9.8 10*3/uL (ref 4.0–10.5)
nRBC: 0 % (ref 0.0–0.2)

## 2018-11-19 LAB — HEPARIN LEVEL (UNFRACTIONATED): Heparin Unfractionated: 0.25 IU/mL — ABNORMAL LOW (ref 0.30–0.70)

## 2018-11-19 SURGERY — LEFT HEART CATH AND CORONARY ANGIOGRAPHY
Anesthesia: Moderate Sedation

## 2018-11-19 MED ORDER — MIDAZOLAM HCL 2 MG/2ML IJ SOLN
INTRAMUSCULAR | Status: AC
  Filled 2018-11-19: qty 2

## 2018-11-19 MED ORDER — ASPIRIN 81 MG PO CHEW: 81.0000 mg | CHEWABLE_TABLET | Freq: Every day | ORAL | Status: DC

## 2018-11-19 MED ORDER — SODIUM CHLORIDE 0.9 % IV SOLN: 250.0000 mL | INTRAVENOUS | Status: DC | PRN

## 2018-11-19 MED ORDER — GLYCOPYRROLATE 0.2 MG/ML IJ SOLN
0.2000 mg | INTRAMUSCULAR | Status: DC | PRN
  Filled 2018-11-19: qty 1

## 2018-11-19 MED ORDER — ASPIRIN 81 MG PO CHEW
CHEWABLE_TABLET | ORAL | Status: AC
  Filled 2018-11-19: qty 1

## 2018-11-19 MED ORDER — ONDANSETRON HCL 4 MG/2ML IJ SOLN: 4.0000 mg | Freq: Four times a day (QID) | INTRAMUSCULAR | Status: DC | PRN

## 2018-11-19 MED ORDER — HEPARIN BOLUS VIA INFUSION
1000.0000 [IU] | Freq: Once | INTRAVENOUS | Status: AC
  Administered 2018-11-19: 1000 [IU] via INTRAVENOUS
  Filled 2018-11-19: qty 1000

## 2018-11-19 MED ORDER — ACETAMINOPHEN 325 MG PO TABS: 650.0000 mg | ORAL_TABLET | Freq: Four times a day (QID) | ORAL | Status: DC | PRN

## 2018-11-19 MED ORDER — SODIUM CHLORIDE 0.9 % WEIGHT BASED INFUSION: 1.0000 mL/kg/h | INTRAVENOUS | Status: DC

## 2018-11-19 MED ORDER — LORAZEPAM 2 MG/ML PO CONC: 1.0000 mg | ORAL | Status: DC | PRN

## 2018-11-19 MED ORDER — SODIUM CHLORIDE 0.9% FLUSH: 3.0000 mL | INTRAVENOUS | Status: DC | PRN

## 2018-11-19 MED ORDER — POLYVINYL ALCOHOL 1.4 % OP SOLN
1.0000 [drp] | Freq: Four times a day (QID) | OPHTHALMIC | Status: DC | PRN
  Filled 2018-11-19: qty 15

## 2018-11-19 MED ORDER — HEPARIN (PORCINE) IN NACL 1000-0.9 UT/500ML-% IV SOLN
INTRAVENOUS | Status: AC
  Filled 2018-11-19: qty 1000

## 2018-11-19 MED ORDER — SODIUM CHLORIDE 0.9% FLUSH
3.0000 mL | Freq: Two times a day (BID) | INTRAVENOUS | Status: DC
  Administered 2018-11-20 (×2): 3 mL via INTRAVENOUS

## 2018-11-19 MED ORDER — VANCOMYCIN HCL IN DEXTROSE 1-5 GM/200ML-% IV SOLN: 1000.0000 mg | INTRAVENOUS | Status: DC

## 2018-11-19 MED ORDER — SODIUM CHLORIDE 0.9 % IV SOLN
2.0000 g | Freq: Once | INTRAVENOUS | Status: AC
  Administered 2018-11-19: 2 g via INTRAVENOUS
  Filled 2018-11-19: qty 2

## 2018-11-19 MED ORDER — LORAZEPAM 2 MG/ML IJ SOLN: 0.5000 mg | INTRAMUSCULAR | Status: DC | PRN

## 2018-11-19 MED ORDER — NEPRO/CARBSTEADY PO LIQD: 237.0000 mL | Freq: Two times a day (BID) | ORAL | Status: DC

## 2018-11-19 MED ORDER — LORAZEPAM 1 MG PO TABS: 1.0000 mg | ORAL_TABLET | ORAL | Status: DC | PRN

## 2018-11-19 MED ORDER — HYDRALAZINE HCL 20 MG/ML IJ SOLN: 10.0000 mg | INTRAMUSCULAR | Status: DC | PRN

## 2018-11-19 MED ORDER — FENTANYL CITRATE (PF) 100 MCG/2ML IJ SOLN
INTRAMUSCULAR | Status: AC
  Filled 2018-11-19: qty 2

## 2018-11-19 MED ORDER — IOHEXOL 300 MG/ML  SOLN
INTRAMUSCULAR | Status: DC | PRN
  Administered 2018-11-19: 08:00:00 60 mL via INTRAVENOUS

## 2018-11-19 MED ORDER — HEPARIN (PORCINE) IN NACL 1000-0.9 UT/500ML-% IV SOLN
INTRAVENOUS | Status: DC | PRN
  Administered 2018-11-19: 1000 mL

## 2018-11-19 MED ORDER — LABETALOL HCL 5 MG/ML IV SOLN: 10.0000 mg | INTRAVENOUS | Status: DC | PRN

## 2018-11-19 MED ORDER — SODIUM CHLORIDE 0.9% FLUSH: 3.0000 mL | Freq: Two times a day (BID) | INTRAVENOUS | Status: DC

## 2018-11-19 MED ORDER — SODIUM CHLORIDE 0.9 % IV BOLUS
250.0000 mL | Freq: Once | INTRAVENOUS | Status: AC
  Administered 2018-11-19: 250 mL via INTRAVENOUS

## 2018-11-19 MED ORDER — HYDROMORPHONE HCL 1 MG/ML IJ SOLN: 0.5000 mg | INTRAMUSCULAR | Status: DC | PRN

## 2018-11-19 MED ORDER — HEPARIN SODIUM (PORCINE) 5000 UNIT/ML IJ SOLN: 5000.0000 [IU] | Freq: Three times a day (TID) | INTRAMUSCULAR | Status: DC

## 2018-11-19 MED ORDER — HALOPERIDOL LACTATE 2 MG/ML PO CONC
0.5000 mg | ORAL | Status: DC | PRN
  Filled 2018-11-19: qty 0.3

## 2018-11-19 MED ORDER — HALOPERIDOL LACTATE 5 MG/ML IJ SOLN: 0.5000 mg | INTRAMUSCULAR | Status: DC | PRN

## 2018-11-19 MED ORDER — HALOPERIDOL 0.5 MG PO TABS
0.5000 mg | ORAL_TABLET | ORAL | Status: DC | PRN
  Filled 2018-11-19: qty 1

## 2018-11-19 MED ORDER — ASPIRIN 81 MG PO CHEW
81.0000 mg | CHEWABLE_TABLET | ORAL | Status: AC
  Administered 2018-11-19: 81 mg via ORAL

## 2018-11-19 MED ORDER — BIOTENE DRY MOUTH MT LIQD: 15.0000 mL | OROMUCOSAL | Status: DC | PRN

## 2018-11-19 MED ORDER — ONDANSETRON 4 MG PO TBDP: 4.0000 mg | ORAL_TABLET | Freq: Four times a day (QID) | ORAL | Status: DC | PRN

## 2018-11-19 MED ORDER — ACETAMINOPHEN 650 MG RE SUPP: 650.0000 mg | Freq: Four times a day (QID) | RECTAL | Status: DC | PRN

## 2018-11-19 MED ORDER — GLYCOPYRROLATE 1 MG PO TABS
1.0000 mg | ORAL_TABLET | ORAL | Status: DC | PRN
  Filled 2018-11-19: qty 1

## 2018-11-19 MED ORDER — VANCOMYCIN HCL 10 G IV SOLR
2000.0000 mg | Freq: Once | INTRAVENOUS | Status: DC
  Filled 2018-11-19: qty 2000

## 2018-11-19 MED ORDER — ACETAMINOPHEN 325 MG PO TABS: 650.0000 mg | ORAL_TABLET | ORAL | Status: DC | PRN

## 2018-11-19 MED ORDER — LORAZEPAM 2 MG/ML IJ SOLN: 1.0000 mg | INTRAMUSCULAR | Status: DC | PRN

## 2018-11-19 MED ORDER — SODIUM CHLORIDE 0.9 % IV SOLN: 2.0000 g | INTRAVENOUS | Status: DC

## 2018-11-19 MED ORDER — SODIUM CHLORIDE 0.9 % IV SOLN
INTRAVENOUS | Status: DC
  Administered 2018-11-19: 07:00:00 via INTRAVENOUS

## 2018-11-19 MED ORDER — LORAZEPAM 1 MG PO TABS
1.0000 mg | ORAL_TABLET | ORAL | Status: DC | PRN
  Administered 2018-11-19: 1 mg via ORAL
  Filled 2018-11-19: qty 1

## 2018-11-19 SURGICAL SUPPLY — 10 items
CATH INFINITI 5FR ANG PIGTAIL (CATHETERS) ×2 IMPLANT
CATH INFINITI 5FR JL4 (CATHETERS) ×2 IMPLANT
CATH INFINITI JR4 5F (CATHETERS) ×2 IMPLANT
DEVICE CLOSURE MYNXGRIP 5F (Vascular Products) ×2 IMPLANT
KIT MANI 3VAL PERCEP (MISCELLANEOUS) ×3 IMPLANT
NDL PERC 18GX7CM (NEEDLE) IMPLANT
NEEDLE PERC 18GX7CM (NEEDLE) ×3 IMPLANT
PACK CARDIAC CATH (CUSTOM PROCEDURE TRAY) ×3 IMPLANT
SHEATH AVANTI 5FR X 11CM (SHEATH) ×2 IMPLANT
WIRE GUIDERIGHT .035X150 (WIRE) ×2 IMPLANT

## 2018-11-19 NOTE — Progress Notes (Signed)
  Pastoral Care Visit for POA   11/19/18 1500  Clinical Encounter Type  Visited With Patient;Health care provider  Visit Type Initial;Other (Comment) (POA)  Referral From Palliative care team  Consult/Referral To Chaplain  Stress Factors  Patient Stress Factors Health changes  Advance Directives (For Healthcare)  Does Patient Have a Medical Advance Directive? Yes  Type of Advance Directive Healthcare Power of Attorney   Pt was reclining in bed, alert and oriented. Pt completed POA listing his brother, [name], as his sole POA. Palliative care notified of completion of POA documentation. Copy of POA added to pt chart and original given to pt.  Darcey Nora, Chaplain

## 2018-11-19 NOTE — Progress Notes (Signed)
Discussed with patient dilaudid and that it will help with his breathing. Patient refuses to take  at this time.  Patient is agreeable to take ativan to see if it will help with his breathing.

## 2018-11-19 NOTE — Progress Notes (Signed)
[facility], [place] 11/19/18  Subjective:   LOS: 4 06/24 0701 - 06/25 0700 In: 410.2 [P.O.:240; I.V.:170.2] Out: 94  Patient presented to the emergency room from the [facility] via EMS for left-sided chest pain which has been going on for 2 to 3 days.  He reports radiation down left arm, shoulder, left lower chest.  Denies any nausea, vomiting, diarrhea.  Patient normally dialyzes at [facility] MWF Patient had an elevated troponin.  He underwent cardiac catheterization today which showed severely depressed left ventricular systolic function with EF less than 20% suggestive of ischemic cardiomyopathy.  He has multivessel coronary disease involving distal left main ostial LAD and ostial circumflex.  Patient is not an acceptable candidate for CABG.  Also not candidate for complex intervention Seen post cath.  Denies any acute complaints   Objective:  Vital signs in last 24 hours:  Temp:  [97.9 F (36.6 C)-101.4 F (38.6 C)] 98.6 F (37 C) (06/25 0957) Pulse Rate:  [79-92] 81 (06/25 0957) Resp:  [16-25] 18 (06/25 0957) BP: (55-111)/(16-84) 101/84 (06/25 0957) SpO2:  [93 %-100 %] 100 % (06/25 0957) Weight:  [92.9 kg-96.2 kg] 96.2 kg (06/25 0649)  Weight change: -3.258 kg Filed Weights   11/18/18 1605 11/19/18 0517 11/19/18 0649  Weight: 92.9 kg 92.9 kg 96.2 kg    Intake/Output:    Intake/Output Summary (Last 24 hours) at 11/19/2018 1559 Last data filed at 11/19/2018 1354 Gross per 24 hour  Intake 410.17 ml  Output -  Net 410.17 ml     Physical Exam: General:  Chronically ill appearing gentleman, laying in the bed  HEENT  anicteric, moist oral mucous membranes  Neck  supple, no masses  Pulm/lungs Normal effort, clear to auscultation, [place] O2  CVS/Heart  no rub  Abdomen:   Soft, nontender  Extremities:  Left BKA, trace edema on the right  Neurologic:  Resting quietly  Skin:  No acute rashes  Access:  Left arm AV fistula,         Basic Metabolic Panel:  Recent Labs  Lab 11/15/18 2117 11/16/18 0537 11/16/18 1639 11/18/18 0728 11/19/18 0406  NA 143 140  --  138 139  K 5.3* 5.2*  --  5.8* 4.5  CL 101 100  --  100 98  CO2 27 26  --  24 26  GLUCOSE 121* 110*  --  98 91  BUN 62* 67*  --  77* 52*  CREATININE 10.60* 11.17*  --  11.79* 8.63*  CALCIUM 10.1 9.6  --  9.7 9.4  PHOS  --   --  4.6  --   --      CBC: Recent Labs  Lab 11/15/18 2117 11/16/18 0537 11/17/18 0447 11/18/18 0728 11/19/18 0406  WBC 9.5 10.3 8.8 10.6* 9.8  NEUTROABS 6.0  --   --   --   --   HGB 14.5 14.3 13.4 13.5 12.7*  HCT 45.8 44.4 41.4 41.9 40.0  MCV 91.1 89.9 90.8 90.5 91.1  PLT 142* 146* 129* 140* 154      Lab Results  Component Value Date   HEPBSAG Negative 09/18/2017   HEPBSAB Non Reactive 05/22/2016      Microbiology:  Recent Results (from the past 240 hour(s))  Blood culture (routine x 2)     Status: None (Preliminary result)   Collection Time: 11/15/18  9:18 PM   Specimen: BLOOD  Result Value Ref Range Status   Specimen Description BLOOD RIGHT  ANTECUBITAL  Final   Special Requests   Final    BOTTLES DRAWN AEROBIC AND ANAEROBIC Blood Culture adequate volume   Culture   Final    NO GROWTH 3 DAYS Performed at [facility], [address] [place], [place] [address]    Report Status PENDING  Incomplete  Blood culture (routine x 2)     Status: None (Preliminary result)   Collection Time: 11/15/18  9:19 PM   Specimen: BLOOD  Result Value Ref Range Status   Specimen Description BLOOD BLOOD RIGHT HAND  Final   Special Requests   Final    BOTTLES DRAWN AEROBIC AND ANAEROBIC Blood Culture adequate volume   Culture   Final    NO GROWTH 3 DAYS Performed at [facility], [address], [place], [place] [address]    Report Status PENDING  Incomplete  SARS Coronavirus 2 (CEPHEID - Performed in [facility] hospital lab), Hosp Order     Status: None   Collection Time: 11/15/18  9:35 PM    Specimen: Nasopharyngeal Swab  Result Value Ref Range Status   SARS Coronavirus 2 NEGATIVE NEGATIVE Final    Comment: (NOTE) If result is NEGATIVE SARS-CoV-2 target nucleic acids are NOT DETECTED. The SARS-CoV-2 RNA is generally detectable in upper and lower  respiratory specimens during the acute phase of infection. The lowest  concentration of SARS-CoV-2 viral copies this assay can detect is 250  copies / mL. A negative result does not preclude SARS-CoV-2 infection  and should not be used as the sole basis for treatment or other  patient management decisions.  A negative result may occur with  improper specimen collection / handling, submission of specimen other  than nasopharyngeal swab, presence of viral mutation(s) within the  areas targeted by this assay, and inadequate number of viral copies  (<250 copies / mL). A negative result must be combined with clinical  observations, patient history, and epidemiological information. If result is POSITIVE SARS-CoV-2 target nucleic acids are DETECTED. The SARS-CoV-2 RNA is generally detectable in upper and lower  respiratory specimens dur ing the acute phase of infection.  Positive  results are indicative of active infection with SARS-CoV-2.  Clinical  correlation with patient history and other diagnostic information is  necessary to determine patient infection status.  Positive results do  not rule out bacterial infection or co-infection with other viruses. If result is PRESUMPTIVE POSTIVE SARS-CoV-2 nucleic acids MAY BE PRESENT.   A presumptive positive result was obtained on the submitted specimen  and confirmed on repeat testing.  While 2019 novel coronavirus  (SARS-CoV-2) nucleic acids may be present in the submitted sample  additional confirmatory testing may be necessary for epidemiological  and / or clinical management purposes  to differentiate between  SARS-CoV-2 and other Sarbecovirus currently known to infect humans.  If  clinically indicated additional testing with an alternate test  methodology [id number]) is advised. The SARS-CoV-2 RNA is generally  detectable in upper and lower respiratory sp ecimens during the acute  phase of infection. The expected result is Negative. Fact Sheet for Patients:  StrictlyIdeas.no Fact Sheet for Healthcare Providers: BankingDealers.co.za This test is not yet approved or cleared by the [place] FDA and has been authorized for detection and/or diagnosis of SARS-CoV-2 by FDA under an Emergency Use Authorization (EUA).  This EUA will remain in effect (meaning this test can be used) for the duration of the COVID-19 declaration under Section 564(b)(1) of the Act, 21 U.S.C. section 360bbb-3(b)(1),  unless the authorization is terminated or revoked sooner. Performed at [facility], [address] [place], [place] [address]   MRSA PCR Screening     Status: Abnormal   Collection Time: 11/16/18  9:39 AM   Specimen: Nasopharyngeal  Result Value Ref Range Status   MRSA by PCR POSITIVE (A) NEGATIVE Final    Comment:        The GeneXpert MRSA Assay (FDA approved for NASAL specimens only), is one component of a comprehensive MRSA colonization surveillance program. It is not intended to diagnose MRSA infection nor to guide or monitor treatment for MRSA infections. RESULT CALLED TO, READ BACK BY AND VERIFIED WITH: JAMIE DEEM @1150  11/16/18 AKT Performed at [facility], [address] [place], [place] [address]     Coagulation Studies: No results for input(s): LABPROT, INR in the last 72 hours.  Urinalysis: No results for input(s): COLORURINE, LABSPEC, PHURINE, GLUCOSEU, HGBUR, BILIRUBINUR, KETONESUR, PROTEINUR, UROBILINOGEN, NITRITE, LEUKOCYTESUR in the last 72 hours.  Invalid input(s): APPERANCEUR    Imaging: Dg Chest 2 View  Result Date: 11/19/2018 CLINICAL DATA:  Fever and shortness of  breath. EXAM: CHEST - 2 VIEW COMPARISON:  11/15/2018 and 11/17/2018 FINDINGS: Stable marked cardiac enlargement and stable tortuosity and calcification of the thoracic aorta. Mild vascular congestion without overt pulmonary edema. There are persistent small bilateral pleural effusions with overlying atelectasis. IMPRESSION: Stable cardiac enlargement and vascular congestion without overt pulmonary edema. Persistent small bilateral pleural effusions and bibasilar atelectasis. Electronically Signed   By: Marijo Sanes M.D.   On: 11/19/2018 09:28     Medications:   . sodium chloride     . Chlorhexidine Gluconate Cloth  6 each Topical Q0600  . Chlorhexidine Gluconate Cloth  6 each Topical Q0600  . diclofenac sodium  2 g Topical QID  . erythromycin  1 application Right Eye QHS  . fluticasone  1 spray Each Nare Daily  . loratadine  10 mg Oral Daily  . mometasone-formoterol  2 puff Inhalation BID  . pantoprazole  40 mg Oral Daily  . pregabalin  75 mg Oral Daily  . sodium chloride flush  3 mL Intravenous Q12H  . tiotropium  18 mcg Inhalation Daily   sodium chloride, acetaminophen **OR** acetaminophen, albuterol, antiseptic oral rinse, docusate sodium, glycopyrrolate **OR** glycopyrrolate **OR** glycopyrrolate, guaiFENesin, haloperidol **OR** haloperidol **OR** haloperidol lactate, HYDROmorphone (DILAUDID) injection, ipratropium-albuterol, LORazepam **OR** LORazepam **OR** LORazepam, nitroGLYCERIN, ondansetron (ZOFRAN) IV, polyvinyl alcohol, sodium chloride flush  Assessment/ Plan:  63 y.o. African-American male with ESRD on hemodialysis, hypertension, hyperlipidemia, anemia, COPD, peripheral vascular disease, left BKA, right toe amputations, severe coronary artery disease, chronic systolic CHF with EF of 36%.  From March 2020 MWF [facility] Nephrology [facility].  Active Problems:   NSTEMI (non-ST elevated myocardial infarction) ([place])   Fluid overload   #.  End-stage renal disease.  MWF  dialysis Recent Labs    11/15/18 2117 11/16/18 0537 11/18/18 0728 11/19/18 0406  CREATININE 10.60* 11.17* 11.79* 8.63*  Palliative discussions are ongoing.  Patient is considering withdrawing from dialysis.  If he wishes to continue dialysis, He is at high risk of cardiac complications. Recommend DNR if he wishes to continue outpatient dialysis.   #. Anemia of CKD  Lab Results  Component Value Date   HGB 12.7 (L) 11/19/2018  Monitor Hold off Epogen  #. SHPTH     Component Value Date/Time   PTH 246 (H) 08/12/2018 1417   Lab Results  Component Value Date  PHOS 4.6 11/16/2018  Continue sevelamer  #Left-sided chest pain, non-STEMI with elevated troponin, Acute on Ch Systolic CHF Currently being monitored in ICU stepdown Cardiology evaluation ongoing.  2D echo 6/22 shows severely depressed LVEF to 20-25%, severely reduced rt ventricle systolic function Cardiac cath- severe multi vessel disease  # Mild hyperkalemia Improved with hemodialysis    LOS: [place] 6/25/20203:59 PM  [place], [place]

## 2018-11-19 NOTE — Consult Note (Addendum)
Consultation Note Date: 11/19/2018   Patient Name: [name]  DOB: 20-Feb-1956  MRN: [id number]  Age / Sex: 63 y.o., male  PCP: Donnie Coffin, MD Referring Physician: Vaughan Basta, *  Reason for Consultation: Establishing goals of care  HPI/Patient Profile: [name]  is a 63 y.o. African-American male with a known history of multiple medical problems are mentioned below, who presented to the emergency room with acute onset of left-sided chest pain felt as pressure and graded 8/10 in severity with no nausea vomiting or diaphoresis, however with associated dyspnea.  Clinical Assessment and Goals of Care: Patient is resting in bed. He is alert and oriented. He states he lives in an assisted living facility. He states he has several siblings as well as 13 children. He states he is widowed. He states his children are not helpful to him except his daughter Alyse Low who is financial POA.   He states he uses a wheelchair at baseline. He tells me he felt he was doing fine until he began having SOB and coughing, and came into the hospital.   We discussed his diagnosis, prognosis, GOC, EOL wishes disposition and options.  He states he was advised that if he has surgery for his heart, he could die on the table. He states without the surgery he will die. Concept of Hospice and Palliative Care were discussed. His sister was called on speakerphone, and other family members were present with her. He becomes SOB and coughs with talking. She states she feels the hospice facility would be the best plan.  She states they have not been able to see him in months because of living in assisted living, and they could see him there, and spend time with him. She is worried that if he has the surgery and dies, the family could not say goodbye.   A detailed discussion was had today regarding advanced directives.  Concepts  specific to code status, artifical feeding and hydration, IV antibiotics and rehospitalization were discussed.  The difference between an aggressive medical intervention path and a comfort care path was discussed.  Values and goals of care important to patient and family were attempted to be elicited.  Discussed limitations of medical interventions to prolong quality of life in some situations and discussed the concept of human mortality.   He states he is tired and does not want to take medications any longer. We discussed that per phone conversation with nephrology, he could continue dialysis if he wishes, however, he states that continuing dialysis would not change his QOL, and "would defeat the purpose". He states he is ready to focus on comfort care.Primary RN at bedside during this conversation.    He states he would like a natural death, and would not want CPR, ventilator, and does not want any other life prolonging measures. He is aware that without dialysis, his prognosis is likely weeks.  Chaplain and notary to bedside to complete H POA papers for brother [name].   Addendum: Met with [name]  who is now HPOA, and other siblings/family members at bedside. Discussed diagnosis, prognosis, and options with [name] consent. [name] speaks with family. Family agrees a hospice focus, with no further life prolonging treatment would be the best plan. They briefly discussed a possible D/C home with hospice, but unfortunately do not feel they could care for him at home, and opt for D/C to hospice facility.       I completed a MOST form today and the signed original was placed in the chart. A photocopy was placed in the chart to be scanned into EMR. The patient outlined their wishes for the following treatment decisions:  Cardiopulmonary Resuscitation: Do Not Attempt Resuscitation (DNR/No CPR)  Medical Interventions: Comfort Measures: Keep clean, warm, and dry. Use medication by any  route, positioning, wound care, and other measures to relieve pain and suffering. Use oxygen, suction and manual treatment of airway obstruction as needed for comfort. Do not transfer to the hospital unless comfort needs cannot be met in current location.  Antibiotics: No antibiotics (use other measures to relieve symptoms)  IV Fluids: No IV fluids (provide other measures to ensure comfort)  Feeding Tube: No feeding tube         SUMMARY OF RECOMMENDATIONS   Shift to comfort care. Referral to hospice facility placed.   Dilaudid for pain or dyspnea. Ativan for anxiety Haldol for agitation Robinul for excessive secretions.   Code Status/Advance Care Planning:  DNR   Prognosis:   < 2 weeks Severe CAD. Stopping dialysis.   Discharge Planning: Hospice facility      Primary Diagnoses: Present on Admission:  Fluid overload  NSTEMI (non-ST elevated myocardial infarction) ([place])   I have reviewed the medical record, interviewed the patient and family, and examined the patient. The following aspects are pertinent.  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[place])    Right Foot   Amputee, below knee, left (HCC)    Anemia    Asthma    Cardiomyopathy ([place])    CHF (congestive heart failure) (HCC)    Chronic systolic heart failure (HCC)    Complication of anesthesia    hypotension   COPD (chronic obstructive pulmonary disease) (HCC)    Coronary artery disease    Dialysis patient ([place])    Mon, Wed, Fri   End stage renal disease ([place])    GERD (gastroesophageal reflux disease)    Headache    History of kidney stones    History of pulmonary embolism    HLD (hyperlipidemia)    HTN (hypertension)    Hyperparathyroidism    Myocardial infarction ([place])    Peripheral vascular disease (HCC)    Shortness of breath dyspnea    Sleep apnea    NO C-PAP, Patient stated in process of  "getting one"    Tobacco dependence    Social  History   Socioeconomic History   Marital status: Widowed    Spouse name: Not on file   Number of children: Not on file   Years of education: Not on file   Highest education level: Not on file  Occupational History   Not on file  Social Needs   Financial resource strain: Not on file   Food insecurity    Worry: Not on file    Inability: Not on file   Transportation needs    Medical: Not on file    Non-medical: Not on file  Tobacco Use   Smoking status: Light Tobacco  Smoker    Packs/day: 0.25    Years: 30.00    Pack years: 7.50    Types: Cigarettes   Smokeless tobacco: Never Used   Tobacco comment: 5  Substance and Sexual Activity   Alcohol use: No    Alcohol/week: 0.0 standard drinks   Drug use: No    Comment: has used crack cocaine in past    Sexual activity: Not Currently    Birth control/protection: Abstinence  Lifestyle   Physical activity    Days per week: Not on file    Minutes per session: Not on file   Stress: Not on file  Relationships   Social connections    Talks on phone: Not on file    Gets together: Not on file    Attends religious service: Not on file    Active member of club or organization: Not on file    Attends meetings of clubs or organizations: Not on file    Relationship status: Not on file  Other Topics Concern   Not on file  Social History Narrative   Engaged.    Family History  Problem Relation Age of Onset   Leukemia Mother    Heart attack Father    Heart failure Other    Hypertension Other    Leukemia Other    Diabetes Other    Prostate cancer Neg Hx    Kidney cancer Neg Hx    Bladder Cancer Neg Hx    Scheduled Meds:  atorvastatin  10 mg Oral QHS   Chlorhexidine Gluconate Cloth  6 each Topical Q0600   Chlorhexidine Gluconate Cloth  6 each Topical Q0600   diclofenac sodium  2 g Topical QID   erythromycin  1 application Right Eye QHS   fluticasone  1 spray Each Nare Daily   loratadine  10  mg Oral Daily   mometasone-formoterol  2 puff Inhalation BID   pantoprazole  40 mg Oral Daily   pregabalin  75 mg Oral Daily   sodium chloride flush  3 mL Intravenous Q12H   tiotropium  18 mcg Inhalation Daily   Continuous Infusions:  sodium chloride     PRN Meds:.sodium chloride, acetaminophen **OR** acetaminophen, albuterol, antiseptic oral rinse, docusate sodium, glycopyrrolate **OR** glycopyrrolate **OR** glycopyrrolate, guaiFENesin, haloperidol **OR** haloperidol **OR** haloperidol lactate, HYDROmorphone (DILAUDID) injection, ipratropium-albuterol, LORazepam **OR** LORazepam **OR** LORazepam, nitroGLYCERIN, ondansetron (ZOFRAN) IV, polyvinyl alcohol, sodium chloride flush Medications Prior to Admission:  Prior to Admission medications   Medication Sig Start Date End Date Taking? Authorizing Provider  acetaminophen (TYLENOL) 500 MG tablet Take 500 mg by mouth every 6 (six) hours as needed for mild pain.    Yes [provider]  albuterol (PROVENTIL HFA;VENTOLIN HFA) 108 (90 Base) MCG/ACT inhaler Inhale 2 puffs into the lungs every 6 (six) hours as needed for wheezing or shortness of breath.    Yes [provider]  albuterol (PROVENTIL) (2.5 MG/3ML) 0.083% nebulizer solution Take 3 mLs (2.5 mg total) by nebulization every 6 (six) hours as needed for wheezing or shortness of breath. 04/24/18  Yes Gladstone Lighter, MD  apixaban (ELIQUIS) 5 MG TABS tablet Take 5 mg by mouth 2 (two) times daily.   Yes [provider]  aspirin EC 325 MG tablet Take 325 mg by mouth daily.    Yes [provider]  atorvastatin (LIPITOR) 10 MG tablet Take 10 mg by mouth at bedtime.    Yes [provider]  budesonide-formoterol (SYMBICORT) 160-4.5 MCG/ACT inhaler Inhale 2 puffs  into the lungs 2 (two) times daily.   Yes [provider]  calcium acetate (PHOSLO) 667 MG capsule Take 667 mg by mouth daily.    Yes [provider]  carvedilol (COREG) 6.25  MG tablet Take 1 tablet (6.25 mg total) by mouth 2 (two) times daily. 10/07/17  Yes Hackney, Otila Kluver A, FNP  cetirizine (ZYRTEC) 10 MG tablet Take 10 mg by mouth daily.   Yes [provider]  docusate sodium (COLACE) 100 MG capsule Take 100 mg by mouth daily as needed for mild constipation or moderate constipation.    Yes [provider]  erythromycin ophthalmic ointment Place 1 application into the right eye at bedtime.   Yes [provider]  fluticasone (FLONASE) 50 MCG/ACT nasal spray Place 1 spray into both nostrils daily.    Yes [provider]  guaifenesin (ROBAFEN) 100 MG/5ML syrup Take 200 mg by mouth 3 (three) times daily as needed for cough.   Yes [provider]  hydrocortisone 2.5 % cream Apply 1 application topically 2 (two) times daily as needed (itching).    Yes [provider]  Ipratropium-Albuterol (COMBIVENT RESPIMAT) 20-100 MCG/ACT AERS respimat Inhale 1 puff into the lungs every 6 (six) hours as needed for wheezing or shortness of breath.   Yes [provider]  isosorbide mononitrate (IMDUR) 30 MG 24 hr tablet Take 30 mg by mouth daily.  08/22/16  Yes [provider]  midodrine (PROAMATINE) 10 MG tablet Take 5 mg by mouth 2 (two) times a day.    Yes [provider]  Multiple Vitamins-Minerals (THERA-M PO) Take 1 tablet by mouth 2 (two) times daily.   Yes [provider]  nitroGLYCERIN (NITROSTAT) 0.4 MG SL tablet Place 0.4 mg under the tongue every 5 (five) minutes as needed for chest pain.   Yes [provider]  pantoprazole (PROTONIX) 40 MG tablet Take 1 tablet (40 mg total) by mouth daily. Patient taking differently: Take 20 mg by mouth daily.  08/18/18  Yes Sudini, Alveta Heimlich, MD  polyethylene glycol (MIRALAX / GLYCOLAX) packet Take 17 g by mouth daily as needed for mild constipation.    Yes [provider]  pregabalin (LYRICA) 75 MG capsule Take 1 capsule (75 mg total) by mouth  daily. 01/22/17  Yes Gladstone Lighter, MD  sevelamer carbonate (RENVELA) 800 MG tablet Take 800 mg by mouth 3 (three) times daily with meals.    Yes [provider]  tiotropium (SPIRIVA) 18 MCG inhalation capsule Place 18 mcg into inhaler and inhale daily.   Yes [provider]  traMADol (ULTRAM) 50 MG tablet Take 75 mg by mouth every 8 (eight) hours as needed (pain).    [provider]   Allergies  Allergen Reactions   Dust Mite Extract Other (See Comments)    Reaction: unknown   Review of Systems  Respiratory: Positive for cough and shortness of breath.     Physical Exam Pulmonary:     Effort: Pulmonary effort is normal.  Musculoskeletal:     Comments: RLE amputation. LLE amputation.  Skin:    General: Skin is warm and dry.  Neurological:     Mental Status: He is alert.     Vital Signs: BP 101/84 (BP Location: Right Wrist)    Pulse 81    Temp 98.6 F (37 C) (Oral)    Resp 18    Ht 6' 2"  (1.88 m)    Wt 96.2 kg    SpO2 100%  BMI 27.22 kg/m  Pain Scale: 0-10 POSS *See Group Information*: 1-Acceptable,Awake and alert Pain Score: 0-No pain   SpO2: SpO2: 100 % O2 Device:SpO2: 100 % O2 Flow Rate: .O2 Flow Rate (L/min): 3 L/min  IO: Intake/output summary:   Intake/Output Summary (Last 24 hours) at 11/19/2018 1456 Last data filed at 11/19/2018 1354 Gross per 24 hour  Intake 410.17 ml  Output 27 ml  Net 383.17 ml    LBM: Last BM Date: 11/18/18 Baseline Weight: Weight: 96 kg Most recent weight: Weight: 96.2 kg     Palliative Assessment/Data:     Time In: 2:30 Time Out: 3:45 Time Total:1 hour 15 min Greater than 50%  of this time was spent counseling and coordinating care related to the above assessment and plan.  Signed by: Asencion Gowda, NP   Please contact Palliative Medicine Team phone at [phone] for questions and concerns.  For individual provider: See Shea Evans

## 2018-11-19 NOTE — Progress Notes (Signed)
Patient returned from cath lab.  Resting comfortably at this time.  Patient moved from 231 to 251 to be closer to the nurses station for closer monitoring.

## 2018-11-19 NOTE — Consult Note (Signed)
Pharmacy Antibiotic Note  [name] is a 63 y.o. male admitted on 11/15/2018 with pneumonia.  Pharmacy has been consulted for cefepime and vancomycin dosing. HD MWF.   Plan: Will give a cefepime 2 g today and will order cefepime 2g on HD days.   Will give a loading dose of vancomycin 2000 mg x 1 followed by vancomycin 1000 mg on HD days. Plan to obtain levels on the 3rd dose.   Height: 6\' 2"  (188 cm) Weight: 212 lb (96.2 kg) IBW/kg (Calculated) : 82.2  Temp (24hrs), Avg:99.2 F (37.3 C), Min:97.9 F (36.6 C), Max:101.4 F (38.6 C)  Recent Labs  Lab 11/15/18 2117 11/15/18 2118 11/16/18 0537 11/16/18 1639 11/17/18 0447 11/18/18 0728 11/19/18 0406  WBC 9.5  --  10.3  --  8.8 10.6* 9.8  CREATININE 10.60*  --  11.17*  --   --  11.79* 8.63*  LATICACIDVEN  --  1.0  --  1.0  --   --   --     Estimated Creatinine Clearance: 10.1 mL/min (A) (by C-G formula based on SCr of 8.63 mg/dL (H)).    Allergies  Allergen Reactions  . Dust Mite Extract Other (See Comments)    Reaction: unknown    Antimicrobials this admission: 6/25 cefepime >>  6/25 vancomycin >>   Dose adjustments this admission: None  Microbiology results: 6/21 BCx: pending  6/22 MRSA PCR: Positive  Thank you for allowing pharmacy to be a part of this patient's care.  Oswald Hillock, PharmD, BCPS 11/19/2018 10:18 AM

## 2018-11-19 NOTE — Progress Notes (Signed)
Patient resting at this time. Palliative care in with this RN and patient. Patient stating that he does not want to do dialysis any longer and wants to be comfortable.  He stated there is no point in doing hd any longer if it wont help.  Family called and will be at bedside.

## 2018-11-19 NOTE — Progress Notes (Signed)
[facility] [place] NAME: [name]    MR#:  [id number]  DATE OF BIRTH:  11/15/1955  SUBJECTIVE:  CHIEF COMPLAINT:   Chief Complaint  Patient presents with  . Chest Pain  . Shortness of Breath   Admitted with chest pain and shortness of breath.  Noted to have elevated troponin.  Also noted to have low blood pressures in ER in stepdown unit. Patient still have chest pain on heparin drip.  Seen by cardiologist, status post cardiac catheterization.  Received hemodialysis per nephrology. Still have chest pain.  REVIEW OF SYSTEMS:  CONSTITUTIONAL: No fever, fatigue or weakness.  EYES: No blurred or double vision.  EARS, NOSE, AND THROAT: No tinnitus or ear pain.  RESPIRATORY: No cough, shortness of breath, wheezing or hemoptysis.  CARDIOVASCULAR: Have chest pain, no orthopnea, edema.  GASTROINTESTINAL: No nausea, vomiting, diarrhea or abdominal pain.  GENITOURINARY: No dysuria, hematuria.  ENDOCRINE: No polyuria, nocturia,  HEMATOLOGY: No anemia, easy bruising or bleeding SKIN: No rash or lesion. MUSCULOSKELETAL: No joint pain or arthritis.   NEUROLOGIC: No tingling, numbness, weakness.  PSYCHIATRY: No anxiety or depression.   ROS  DRUG ALLERGIES:   Allergies  Allergen Reactions  . Dust Mite Extract Other (See Comments)    Reaction: unknown    VITALS:  Blood pressure 101/84, pulse 81, temperature 98.6 F (37 C), temperature source Oral, resp. rate 18, height 6\' 2"  (1.88 m), weight 96.2 kg, SpO2 100 %.  PHYSICAL EXAMINATION:  GENERAL:  63 y.o.-year-old patient lying in the bed with acute distress due to chest pain.  EYES: Pupils equal, round, reactive to light and accommodation. No scleral icterus. Extraocular muscles intact.  HEENT: Head atraumatic, normocephalic. Oropharynx and nasopharynx clear.  NECK:  Supple, no jugular venous distention. No thyroid enlargement, no tenderness.  LUNGS: Normal breath sounds bilaterally, no  wheezing, have crepitation. No use of accessory muscles of respiration.  CARDIOVASCULAR: S1, S2 normal. No murmurs, rubs, or gallops.  ABDOMEN: Soft, nontender, nondistended. Bowel sounds present. No organomegaly or mass.  EXTREMITIES: No pedal edema, cyanosis, or clubbing.  NEUROLOGIC: Cranial nerves II through XII are intact. Muscle strength 4/5 in all extremities. Sensation intact. Gait not checked.  PSYCHIATRIC: The patient is alert and oriented x 3.  SKIN: No obvious rash, lesion, or ulcer.   Physical Exam LABORATORY PANEL:   CBC Recent Labs  Lab 11/19/18 0406  WBC 9.8  HGB 12.7*  HCT 40.0  PLT 154   ------------------------------------------------------------------------------------------------------------------  Chemistries  Recent Labs  Lab 11/15/18 2117  11/19/18 0406  NA 143   < > 139  K 5.3*   < > 4.5  CL 101   < > 98  CO2 27   < > 26  GLUCOSE 121*   < > 91  BUN 62*   < > 52*  CREATININE 10.60*   < > 8.63*  CALCIUM 10.1   < > 9.4  AST 34  --   --   ALT 34  --   --   ALKPHOS 139*  --   --   BILITOT 0.6  --   --    < > = values in this interval not displayed.   ------------------------------------------------------------------------------------------------------------------  Cardiac Enzymes Recent Labs  Lab 11/15/18 2117 11/16/18 1639  TROPONINI 0.96* 8.28*   ------------------------------------------------------------------------------------------------------------------  RADIOLOGY:  Dg Chest 2 View  Result Date: 11/19/2018 CLINICAL DATA:  Fever and shortness of breath. EXAM: CHEST - 2 VIEW COMPARISON:  11/15/2018 and 11/17/2018 FINDINGS: Stable marked cardiac enlargement and stable tortuosity and calcification of the thoracic aorta. Mild vascular congestion without overt pulmonary edema. There are persistent small bilateral pleural effusions with overlying atelectasis. IMPRESSION: Stable cardiac enlargement and vascular congestion without overt  pulmonary edema. Persistent small bilateral pleural effusions and bibasilar atelectasis. Electronically Signed   By: Marijo Sanes M.D.   On: 11/19/2018 09:28    ASSESSMENT AND PLAN:   Active Problems:   NSTEMI (non-ST elevated myocardial infarction) (HCC)   Fluid overload  1.  Non-STEMI.   Initially was admitted to stepdown unit.    Remains stable.  on aspirin and IV heparin.   statin therapy  on PRN IV morphine sulfate and sublingual nitroglycerin.  With stabilize blood pressure Nitropaste can be added. Hold beta-blockers due to low blood pressure.  2D echo and a cardiology consultation- Cardiac cath done which again showed significant coronary artery disease and multiple blockages but due to patient's severe cardiomyopathy and end-stage renal disease he is not a candidate for surgical intervention and cardiology suggested to continue aggressive medical management which is limited due to patient's persistent low blood pressure. Meanwhile he continued to have significant chest pain, called palliative care to discuss the goals of care and patient has agreed for comfort measures and hospice involvement.  2.  Acute on chronic systolic CHF.  Most recent EF was 20 to 25% in  mildly dilated left and right atria with trivial aortic regurgitation.  The patient will be diuresed with IV Lasix.  Nephrology consultation obtained for hemodialysis.  Received hemodialysis.  3.  End-stage renal disease on hemodialysis.  Nephrology consult continue his Renvela.  4.  Dyslipidemia.  Statin therapy will be resumed.  5.  GERD.  PPI therapy will be resumed.  6.  COPD.  We will continue Spiriva and place the patient on PRN duo nebs.   7.  DVT prophylaxis.  The patient is on IV heparin for his ACS.  8.  Hypotension- normal saline bolus 500 mL.   Blood pressure improved and stable.   All the records are reviewed and case discussed with Care Management/Social Workerr. Management plans discussed  with the patient, family and they are in agreement.  CODE STATUS: Full code  TOTAL TIME TAKING CARE OF THIS PATIENT: 35 minutes.   POSSIBLE D/C IN 1-2 DAYS, DEPENDING ON CLINICAL CONDITION.   Vaughan Basta M.D on 11/19/2018   Between 7am to 6pm - Pager - [phone]  After 6pm go to [email or web address] - password [email or web address] [facility]  Office  [phone]  CC: Primary care physician; Donnie Coffin, MD  Note: This dictation was prepared with Dragon dictation along with smaller phrase technology. Any transcriptional errors that result from this process are unintentional.

## 2018-11-19 NOTE — Progress Notes (Signed)
Pt was in denial about his condition, he said he would get better. Chaplain explored what pt meant by 'I will get better,' if he meant spiritually in afterlife  or physically. He was in and out of sleep and could only hold short conversations. Pt requested a follow up.   11/19/18 2000  Clinical Encounter Type  Visited With Patient  Visit Type Follow-up;Spiritual support;Other (Comment)  Referral From Nurse  Consult/Referral To Chaplain  Recommendations Pt is in denial about his prognosis.  Stress Factors  Patient Stress Factors Exhausted;Health changes;Other (Comment)  Advance Directives (For Healthcare)  Does Patient Have a Medical Advance Directive? Yes  Type of Paramedic of [place]

## 2018-11-19 NOTE — Progress Notes (Signed)
ANTICOAGULATION CONSULT NOTE - Initial Consult  Pharmacy Consult for heparin Indication: chest pain/ACS  Allergies  Allergen Reactions  . Dust Mite Extract Other (See Comments)    Reaction: unknown    Patient Measurements: Height: 6\' 2"  (188 cm) Weight: 204 lb 14.4 oz (92.9 kg) IBW/kg (Calculated) : 82.2 Heparin Dosing Weight: 96 kg  Vital Signs: Temp: 100 F (37.8 C) (06/25 0252) Temp Source: Oral (06/25 0252) BP: 84/46 (06/25 0252) Pulse Rate: 90 (06/25 0252)  Labs: Recent Labs    11/16/18 0537 11/16/18 1639 11/17/18 0447 11/18/18 0728 11/18/18 1327 11/18/18 1950 11/19/18 0406  HGB 14.3  --  13.4 13.5  --   --  12.7*  HCT 44.4  --  41.4 41.9  --   --  40.0  PLT 146*  --  129* 140*  --   --  154  HEPARINUNFRC 0.35 0.51 0.32 0.18* 0.47 0.36 0.25*  CREATININE 11.17*  --   --  11.79*  --   --   --   TROPONINI  --  8.28*  --   --   --   --   --     Estimated Creatinine Clearance: 7.4 mL/min (A) (by C-G formula based on SCr of 11.79 mg/dL (H)).   Medical History: Past Medical History:  Diagnosis Date  . 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 Amputee, below knee, left ([place])   . Anemia   . Asthma   . Cardiomyopathy ([name])   . CHF (congestive heart failure) ([place])   . Chronic systolic heart failure ([place])   . Complication of anesthesia    hypotension  . COPD (chronic obstructive pulmonary disease) ([place])   . Coronary artery disease   . Dialysis patient ([place])    Mon, Wed, Fri  . End stage renal disease ([place])   . GERD (gastroesophageal reflux disease)   . Headache   . History of kidney stones   . History of pulmonary embolism   . HLD (hyperlipidemia)   . HTN (hypertension)   . Hyperparathyroidism   . Myocardial infarction ([place])   . Peripheral vascular disease ([place])   . Shortness of breath dyspnea   . Sleep apnea    NO C-PAP, Patient stated in process of  "getting one"   . Tobacco dependence     Medications:  Scheduled:  . aspirin EC  325  mg Oral Daily  . atorvastatin  10 mg Oral QHS  . Chlorhexidine Gluconate Cloth  6 each Topical Q0600  . Chlorhexidine Gluconate Cloth  6 each Topical Q0600  . diclofenac sodium  2 g Topical QID  . erythromycin  1 application Right Eye QHS  . fluticasone  1 spray Each Nare Daily  . loratadine  10 mg Oral Daily  . midodrine  10 mg Oral TID WC  . mometasone-formoterol  2 puff Inhalation BID  . multivitamin with minerals  1 tablet Oral BID  . mupirocin ointment  1 application Nasal BID  . pantoprazole  40 mg Oral Daily  . pregabalin  75 mg Oral Daily  . sevelamer carbonate  800 mg Oral TID WC  . sodium chloride flush  3 mL Intravenous Q12H  . tiotropium  18 mcg Inhalation Daily    Assessment: Patient admitted for LS CP and fluid overload as evidenced by cardiomegaly on CXR. Patient has a h/o ESRD on HD MWF. BNP 2274, initial trop of 0.96; patient was here back in 04/26 w/ trops of 0.12 >>  0.11. EKG showing LBBB. Patient is not on anticoagulation PTA.  Patient is being started on heparin for probable NSTEMI.  6/22 Heparin infusion started @ 1350 units/hr 06/22 @ 0530 HL 0.35 therapeutic. 6/22@1703  HL 0.51 therapeutic x2. 6/23 @0447  HL 0.32  6/24 @0728  HL 0.18 2500 bolus x 1 and increase rate to   f 1600 units/hr 6/24 @1327  HL 0.47  6/24 @ 1950 HL 0.36 - patient will likely go to cardiac cath tomorrow. 5/25 @ 0406 HL: 0.25- Level is subtherapeutic.   Goal of Therapy:  Heparin level 0.3-0.7 units/ml Monitor platelets by anticoagulation protocol: Yes   Plan:  5/25 @ 0406 HL: 0.25- Level is subtherapeutic .Confirmed with nurse, no interruptions of the heparin gtt. Will order heparin 1000 unit bolus and increase infusion rate to 1750 units/hr.   CBC stable, will continue to monitor. Will recheck HL in 6 hours.   Per protocol HL and CBC ordered with AM labs daily while on heparin infusion.   Pernell Dupre, PharmD, BCPS Clinical Pharmacist 11/19/2018 4:51 AM

## 2018-11-20 LAB — CULTURE, BLOOD (ROUTINE X 2)
Culture: NO GROWTH
Culture: NO GROWTH
Special Requests: ADEQUATE
Special Requests: ADEQUATE

## 2018-11-20 MED ORDER — MIDODRINE HCL 5 MG PO TABS: 10.0000 mg | ORAL_TABLET | Freq: Once | ORAL | Status: DC

## 2018-11-20 MED ORDER — FENTANYL CITRATE (PF) 100 MCG/2ML IJ SOLN
25.0000 ug | INTRAMUSCULAR | Status: DC | PRN
  Administered 2018-11-20 – 2018-11-21 (×3): 50 ug via INTRAVENOUS
  Filled 2018-11-20 (×3): qty 2

## 2018-11-20 MED ORDER — FENTANYL CITRATE (PF) 100 MCG/2ML IJ SOLN
25.0000 ug | INTRAMUSCULAR | Status: DC | PRN
  Administered 2018-11-20: 25 ug via INTRAVENOUS
  Filled 2018-11-20: qty 2

## 2018-11-20 NOTE — Progress Notes (Addendum)
Daily Progress Note   Patient Name: [name]       Date: 11/20/2018 DOB: Aug 12, 1955  Age: 63 y.o. MRN#: [id number] Attending Physician: Vaughan Basta, * Primary Care Physician: Donnie Coffin, MD Admit Date: 11/15/2018  Reason for Consultation/Follow-up: Establishing goals of care, Psychosocial/spiritual support and Terminal Care  Subjective: Patient is resting in bed. He complains of chest pain, abdominal pain, and SOB. He states he has not wanted symptom management because "I don't want to take a pill that's going to kill me with one pill." Explained the medications are in place for his comfort, and are ordered at doses that are only meant to provide symptom relief while allowing him to be alert and to spend time with family. He states  "well, my chest is killing me, and my stomach hurts too, I want something, give me something."  He states he is fearful of Dilaudid. Unable to use Morphine as he has ESRD. Dilaudid changed to Fentanyl. Following Fentanyl administration, he states he feels better.   I was notified by staff that patient stated he wants to go to dilaysis. He states " there is so much confusion. I want to go to hospice not dialysis. If I have to go to dialysis to get to hospice then I will."  Explained that he does not have to complete dialysis if he does not wish. RN in to bedside. Patient states  " I have been having problems for 15 or 20 years and I'm tired. There is no use in continuing dialysis, it won't help. I'm ready to go. I don't want to do this anymore, I just want to be comfortable. " He confirmed he wants no further life prolonging measures.    Reentered with Dr. Wendi Snipes as dialysis has been scheduled for today. Patient states he is tired and does not want to  continue dialysis. He states he is tired of having pain and just wants to "focus on comfort and go when the good Lord calls him".      Length of Stay: 5  Current Medications: Scheduled Meds:  . Chlorhexidine Gluconate Cloth  6 each Topical Q0600  . Chlorhexidine Gluconate Cloth  6 each Topical Q0600  . diclofenac sodium  2 g Topical QID  . erythromycin  1 application Right Eye QHS  .  fluticasone  1 spray Each Nare Daily  . loratadine  10 mg Oral Daily  . midodrine  10 mg Oral Once in dialysis  . mometasone-formoterol  2 puff Inhalation BID  . pantoprazole  40 mg Oral Daily  . pregabalin  75 mg Oral Daily  . sodium chloride flush  3 mL Intravenous Q12H  . tiotropium  18 mcg Inhalation Daily    Continuous Infusions: . sodium chloride      PRN Meds: sodium chloride, acetaminophen **OR** acetaminophen, albuterol, antiseptic oral rinse, docusate sodium, fentaNYL (SUBLIMAZE) injection, glycopyrrolate **OR** glycopyrrolate **OR** glycopyrrolate, guaiFENesin, haloperidol **OR** haloperidol **OR** haloperidol lactate, ipratropium-albuterol, LORazepam **OR** LORazepam **OR** LORazepam, nitroGLYCERIN, ondansetron (ZOFRAN) IV, polyvinyl alcohol, sodium chloride flush  Physical Exam Pulmonary:     Effort: Pulmonary effort is normal.  Skin:    General: Skin is warm and dry.  Neurological:     Mental Status: He is alert.             Vital Signs: BP (!) 74/50 (BP Location: Right Arm)   Pulse 78   Temp 98.6 F (37 C) (Oral)   Resp 18   Ht 6\' 2"  (1.88 m)   Wt 96.2 kg   SpO2 100%   BMI 27.22 kg/m  SpO2: SpO2: 100 % O2 Device: O2 Device: Nasal Cannula O2 Flow Rate: O2 Flow Rate (L/min): 3 L/min  Intake/output summary:   Intake/Output Summary (Last 24 hours) at 11/20/2018 0943 Last data filed at 11/20/2018 0055 Gross per 24 hour  Intake 0 ml  Output 0 ml  Net 0 ml   LBM: Last BM Date: 11/18/18 Baseline Weight: Weight: 96 kg Most recent weight: Weight: 96.2 kg        Palliative Assessment/Data: 30%      Patient Active Problem List   Diagnosis Date Noted  . Fluid overload 11/15/2018  . Non-STEMI (non-ST elevated myocardial infarction) ([place]) 08/10/2018  . Acute GI bleeding   . HCAP (healthcare-associated pneumonia) 04/22/2018  . Acute on chronic respiratory failure with hypoxia ([place]) 12/21/2017  . Tobacco use 10/07/2017  . Apnea   . End-stage renal disease on hemodialysis ([place])   . Hypervolemia   . Altered mental status   . Acute on chronic systolic CHF (congestive heart failure) ([place]) 01/18/2017  . Pulmonary embolism ([place]) 08/03/2016  . NSTEMI (non-ST elevated myocardial infarction) ([place]) 07/26/2016  . PVD (peripheral vascular disease) ([place]) 03/05/2016  . Preop examination 05/02/2015  . Chronic systolic heart failure ([place])   . Hyperkalemia 01/13/2015  . Normocytic anemia 05/04/2014  . Hyperlipidemia 04/26/2010  . HYPERTENSION, BENIGN 04/26/2010  . CAD, NATIVE VESSEL 04/26/2010    Palliative Care Assessment & Plan     Recommendations/Plan:  Focus on comfort.   Dilaudid changed to Fentanyl as patient does not want to use Dilaudid in any dosage. Unable to use morphine 2/2 ESRD.       Code Status:    Code Status Orders  (From admission, onward)         Start     Ordered   11/19/18 1435  Do not attempt resuscitation (DNR)  Continuous    Question Answer Comment  In the event of cardiac or respiratory ARREST Do not call a "code blue"   In the event of cardiac or respiratory ARREST Do not perform Intubation, CPR, defibrillation or ACLS   In the event of cardiac or respiratory ARREST Use medication by any route, position, wound care, and other measures to relive pain and suffering. May  use oxygen, suction and manual treatment of airway obstruction as needed for comfort.   Comments MOST form in chart      11/19/18 1438        Code Status History    Date Active Date Inactive Code Status Order ID Comments User Context    11/15/2018 2333 11/19/2018 1438 Full Code [id number]  Christel Mormon, MD ED   08/10/2018 0546 08/18/2018 1817 Full Code [id number]  Mansy, Arvella Merles, MD ED   04/22/2018 1535 04/24/2018 2107 Full Code [id number]  Gorden Harms, MD Inpatient   12/21/2017 1105 12/23/2017 1713 Full Code [id number]  Henreitta Leber, MD Inpatient   09/18/2017 1335 09/19/2017 2041 Full Code [id number]  Bettey Costa, MD Inpatient   07/21/2017 1610 07/26/2017 0039 Full Code [id number]  Bettey Costa, MD Inpatient   06/22/2017 2145 06/23/2017 1805 Full Code [id number]  Henreitta Leber, MD Inpatient   04/07/2017 0937 04/10/2017 1828 Full Code [id number]  Eloise Levels, MD ED   01/19/2017 0145 01/21/2017 1941 Full Code [id number]  Lance Coon, MD Inpatient   11/11/2016 0421 11/14/2016 1908 Full Code [id number]  Harrie Foreman, MD Inpatient   10/28/2016 0756 10/30/2016 1930 Full Code [id number]  Demetrios Loll, MD ED   08/21/2016 0408 08/22/2016 1826 DNR [id number]  Harrie Foreman, MD Inpatient   08/03/2016 2319 08/07/2016 1928 DNR [id number]  Lance Coon, MD Inpatient   07/26/2016 1707 07/31/2016 2158 DNR [id number]  Vaughan Basta, MD Inpatient   05/22/2016 0034 05/24/2016 1911 Full Code [id number]  Hugelmeyer, Ubaldo Glassing, DO Inpatient   01/13/2015 0144 01/14/2015 1446 Full Code [id number]  Juluis Mire, MD ED   12/15/2014 0932 12/15/2014 1431 Full Code [id number]  Algernon Huxley, MD Inpatient   05/06/2014 2029 05/10/2014 1723 Full Code [id number]  Elam Dutch, MD Inpatient   05/04/2014 2224 05/06/2014 2029 Full Code [id number]  Rise Patience, MD Inpatient   Advance Care Planning Activity    Advance Directive Documentation     Most Recent Value  Type of Advance Directive  Healthcare Power of Attorney  Pre-existing out of facility DNR order (yellow form or pink MOST form)  Yellow form placed in chart (order not valid for inpatient use)  "MOST" Form in Place?  -       Prognosis:   < 2 weeks Stopping dialysis. Severe CAD and not a  candidate for intervention.   Discharge Planning:  Hospice facility  Care plan was discussed with RN, primary MD, SW.   Thank you for allowing the Palliative Medicine Team to assist in the care of this patient.   Time In: 9:25 Time Out: 10:15 Total Time 50 min Prolonged Time Billed  no      Greater than 50%  of this time was spent counseling and coordinating care related to the above assessment and plan.  Asencion Gowda, NP  Please contact Palliative Medicine Team phone at [phone] for questions and concerns.

## 2018-11-20 NOTE — Progress Notes (Signed)
Telephone call to Mr. Yanke's brother and HCPOA [name] to discuss consents and visitation policy at the hospice home. He voiced understanding. He does not have the ability to sign consents electronically. Writer advised him that the hospice social worker would be in touch via telephone tomorrow, pending patient's stability for transport, he voice understanding. CMRN Geoffry Paradise updated.  Flo Shanks BSN, RN, [facility] Liaison [phone]

## 2018-11-20 NOTE — Progress Notes (Signed)
While I was at pt's bedside with [name] of palliative care  he clearly stated he did NOT want any more dialysis and that he wished to go to hospice for comfort care

## 2018-11-20 NOTE — Progress Notes (Signed)
Ch f/u with pt as requested. Pt has refused HD at this point. Pt comes from a SNF as reported by care team members. Pt presented to have a moderate affect yet struggled to hold a conversation. Pt was alert with daughter at bedside communicating with family. Ch provided words of comfort and guided pt towards sharing their current needs and concerns. Pt recalled speaking with the Palliative NP about hospice and seemed to be content with his decision. Ch informed pt and daughter of the visitation restrictions that would be different than the policy here in the hospital. Both pt and daughter shared that he has a large family and several children but the daughter bedside was "his first baby" in a jovial tone.   F/U: Contact hospice coordinator to get clarity on d/c date and visitation. Ch is hopeful that the pt can been seen by family while awaiting placement in hospice.     11/20/18 1100  Clinical Encounter Type  Visited With Patient and family together;Health care provider  Visit Type Follow-up;Psychological support;Spiritual support;Social support  Referral From Chaplain  Consult/Referral To Chaplain  Spiritual Encounters  Spiritual Needs Emotional;Grief support  Stress Factors  Patient Stress Factors Exhausted;Health changes;Major life changes  Family Stress Factors Major life changes

## 2018-11-20 NOTE — Progress Notes (Signed)
[facility], [place] 11/20/18  Subjective:   LOS: 5 No intake/output data recorded. Patient presented to the emergency room from the [facility] via EMS for left-sided chest pain which has been going on for 2 to 3 days.  He reports radiation down left arm, shoulder, left lower chest.  Denies any nausea, vomiting, diarrhea.  Patient normally dialyzes at [facility] MWF Patient had an elevated troponin.  He underwent cardiac catheterization today which showed severely depressed left ventricular systolic function with EF less than 20% suggestive of ischemic cardiomyopathy.  He has multivessel coronary disease involving distal left main ostial LAD and ostial circumflex.  Patient is not an acceptable candidate for CABG.  Also not candidate for complex intervention Denies any acute complaints or shortness of breath   Objective:  Vital signs in last 24 hours:  Temp:  [98.6 F (37 C)] 98.6 F (37 C) (06/25 0957) Pulse Rate:  [78-83] 78 (06/26 0436) Resp:  [18-20] 18 (06/25 0957) BP: (74-101)/(45-84) 74/50 (06/26 0436) SpO2:  [97 %-100 %] 100 % (06/25 0957)  Weight change:  Filed Weights   11/18/18 1605 11/19/18 0517 11/19/18 0649  Weight: 92.9 kg 92.9 kg 96.2 kg    Intake/Output:    Intake/Output Summary (Last 24 hours) at 11/20/2018 0909 Last data filed at 11/20/2018 0055 Gross per 24 hour  Intake 0 ml  Output 0 ml  Net 0 ml     Physical Exam: General:  Chronically ill appearing gentleman, laying in the bed  HEENT  anicteric, moist oral mucous membranes  Neck  supple, no masses  Pulm/lungs Normal effort, clear to auscultation, [place] O2  CVS/Heart  no rub  Abdomen:   Soft, nontender  Extremities:  Left BKA, trace edema on the right  Neurologic:  Resting quietly  Skin:  No acute rashes  Access:  Left arm AV fistula,        Basic Metabolic Panel:  Recent Labs  Lab 11/15/18 2117 11/16/18 0537 11/16/18 1639 11/18/18 0728  11/19/18 0406  NA 143 140  --  138 139  K 5.3* 5.2*  --  5.8* 4.5  CL 101 100  --  100 98  CO2 27 26  --  24 26  GLUCOSE 121* 110*  --  98 91  BUN 62* 67*  --  77* 52*  CREATININE 10.60* 11.17*  --  11.79* 8.63*  CALCIUM 10.1 9.6  --  9.7 9.4  PHOS  --   --  4.6  --   --      CBC: Recent Labs  Lab 11/15/18 2117 11/16/18 0537 11/17/18 0447 11/18/18 0728 11/19/18 0406  WBC 9.5 10.3 8.8 10.6* 9.8  NEUTROABS 6.0  --   --   --   --   HGB 14.5 14.3 13.4 13.5 12.7*  HCT 45.8 44.4 41.4 41.9 40.0  MCV 91.1 89.9 90.8 90.5 91.1  PLT 142* 146* 129* 140* 154      Lab Results  Component Value Date   HEPBSAG Negative 09/18/2017   HEPBSAB Non Reactive 05/22/2016      Microbiology:  Recent Results (from the past 240 hour(s))  Blood culture (routine x 2)     Status: None   Collection Time: 11/15/18  9:18 PM   Specimen: BLOOD  Result Value Ref Range Status   Specimen Description BLOOD RIGHT ANTECUBITAL  Final   Special Requests   Final    BOTTLES DRAWN AEROBIC AND ANAEROBIC Blood Culture adequate volume  Culture   Final    NO GROWTH 5 DAYS Performed at [facility], [address] [place], [place] [address]    Report Status 11/20/2018 FINAL  Final  Blood culture (routine x 2)     Status: None   Collection Time: 11/15/18  9:19 PM   Specimen: BLOOD  Result Value Ref Range Status   Specimen Description BLOOD BLOOD RIGHT HAND  Final   Special Requests   Final    BOTTLES DRAWN AEROBIC AND ANAEROBIC Blood Culture adequate volume   Culture   Final    NO GROWTH 5 DAYS Performed at [facility], [address], [place], [place] [address]    Report Status 11/20/2018 FINAL  Final  SARS Coronavirus 2 (CEPHEID - Performed in [place] hospital lab), Hosp Order     Status: None   Collection Time: 11/15/18  9:35 PM   Specimen: Nasopharyngeal Swab  Result Value Ref Range Status   SARS Coronavirus 2 NEGATIVE NEGATIVE Final    Comment: (NOTE) If result is  NEGATIVE SARS-CoV-2 target nucleic acids are NOT DETECTED. The SARS-CoV-2 RNA is generally detectable in upper and lower  respiratory specimens during the acute phase of infection. The lowest  concentration of SARS-CoV-2 viral copies this assay can detect is 250  copies / mL. A negative result does not preclude SARS-CoV-2 infection  and should not be used as the sole basis for treatment or other  patient management decisions.  A negative result may occur with  improper specimen collection / handling, submission of specimen other  than nasopharyngeal swab, presence of viral mutation(s) within the  areas targeted by this assay, and inadequate number of viral copies  (<250 copies / mL). A negative result must be combined with clinical  observations, patient history, and epidemiological information. If result is POSITIVE SARS-CoV-2 target nucleic acids are DETECTED. The SARS-CoV-2 RNA is generally detectable in upper and lower  respiratory specimens dur ing the acute phase of infection.  Positive  results are indicative of active infection with SARS-CoV-2.  Clinical  correlation with patient history and other diagnostic information is  necessary to determine patient infection status.  Positive results do  not rule out bacterial infection or co-infection with other viruses. If result is PRESUMPTIVE POSTIVE SARS-CoV-2 nucleic acids MAY BE PRESENT.   A presumptive positive result was obtained on the submitted specimen  and confirmed on repeat testing.  While 2019 novel coronavirus  (SARS-CoV-2) nucleic acids may be present in the submitted sample  additional confirmatory testing may be necessary for epidemiological  and / or clinical management purposes  to differentiate between  SARS-CoV-2 and other Sarbecovirus currently known to infect humans.  If clinically indicated additional testing with an alternate test  methodology [phone]) is advised. The SARS-CoV-2 RNA is generally  detectable  in upper and lower respiratory sp ecimens during the acute  phase of infection. The expected result is Negative. Fact Sheet for Patients:  StrictlyIdeas.no Fact Sheet for Healthcare Providers: BankingDealers.co.za This test is not yet approved or cleared by the [place] FDA and has been authorized for detection and/or diagnosis of SARS-CoV-2 by FDA under an Emergency Use Authorization (EUA).  This EUA will remain in effect (meaning this test can be used) for the duration of the COVID-19 declaration under Section 564(b)(1) of the Act, 21 U.S.C. section 360bbb-3(b)(1), unless the authorization is terminated or revoked sooner. Performed at [facility], [address], [place], [place] [address]   MRSA PCR  Screening     Status: Abnormal   Collection Time: 11/16/18  9:39 AM   Specimen: Nasopharyngeal  Result Value Ref Range Status   MRSA by PCR POSITIVE (A) NEGATIVE Final    Comment:        The GeneXpert MRSA Assay (FDA approved for NASAL specimens only), is one component of a comprehensive MRSA colonization surveillance program. It is not intended to diagnose MRSA infection nor to guide or monitor treatment for MRSA infections. RESULT CALLED TO, READ BACK BY AND VERIFIED WITH: JAMIE DEEM @1150  11/16/18 AKT Performed at [facility], [address] [place], [place] [address]     Coagulation Studies: No results for input(s): LABPROT, INR in the last 72 hours.  Urinalysis: No results for input(s): COLORURINE, LABSPEC, PHURINE, GLUCOSEU, HGBUR, BILIRUBINUR, KETONESUR, PROTEINUR, UROBILINOGEN, NITRITE, LEUKOCYTESUR in the last 72 hours.  Invalid input(s): APPERANCEUR    Imaging: Dg Chest 2 View  Result Date: 11/19/2018 CLINICAL DATA:  Fever and shortness of breath. EXAM: CHEST - 2 VIEW COMPARISON:  11/15/2018 and 11/17/2018 FINDINGS: Stable marked cardiac enlargement and stable tortuosity and  calcification of the thoracic aorta. Mild vascular congestion without overt pulmonary edema. There are persistent small bilateral pleural effusions with overlying atelectasis. IMPRESSION: Stable cardiac enlargement and vascular congestion without overt pulmonary edema. Persistent small bilateral pleural effusions and bibasilar atelectasis. Electronically Signed   By: Marijo Sanes M.D.   On: 11/19/2018 09:28     Medications:   . sodium chloride     . Chlorhexidine Gluconate Cloth  6 each Topical Q0600  . Chlorhexidine Gluconate Cloth  6 each Topical Q0600  . diclofenac sodium  2 g Topical QID  . erythromycin  1 application Right Eye QHS  . fluticasone  1 spray Each Nare Daily  . loratadine  10 mg Oral Daily  . midodrine  10 mg Oral Once in dialysis  . mometasone-formoterol  2 puff Inhalation BID  . pantoprazole  40 mg Oral Daily  . pregabalin  75 mg Oral Daily  . sodium chloride flush  3 mL Intravenous Q12H  . tiotropium  18 mcg Inhalation Daily   sodium chloride, acetaminophen **OR** acetaminophen, albuterol, antiseptic oral rinse, docusate sodium, glycopyrrolate **OR** glycopyrrolate **OR** glycopyrrolate, guaiFENesin, haloperidol **OR** haloperidol **OR** haloperidol lactate, HYDROmorphone (DILAUDID) injection, ipratropium-albuterol, LORazepam **OR** LORazepam **OR** LORazepam, nitroGLYCERIN, ondansetron (ZOFRAN) IV, polyvinyl alcohol, sodium chloride flush  Assessment/ Plan:  63 y.o. African-American male with ESRD on hemodialysis, hypertension, hyperlipidemia, anemia, COPD, peripheral vascular disease, left BKA, right toe amputations, severe coronary artery disease, chronic systolic CHF with EF of 93%.  From March 2020 MWF [facility].  Active Problems:   NSTEMI (non-ST elevated myocardial infarction) ([place])   Fluid overload   #.  End-stage renal disease.  MWF dialysis  Initially this morning, patient wanted to continue dialysis.  Later in the day he had  further discussions with palliative care team and his family decided to go with hospice and withdrawal from dialysis. Palliative care had discussions with patient and several family members. Discussed with dialysis liaison from patient pathways to notify outpatient unit of this decision   #. Anemia of CKD  Lab Results  Component Value Date   HGB 12.7 (L) 11/19/2018  Monitor Hold off Epogen  #. SHPTH     Component Value Date/Time   PTH 246 (H) 08/12/2018 1417   Lab Results  Component Value Date   PHOS 4.6 11/16/2018  Continue sevelamer  #Left-sided chest pain, non-STEMI  with elevated troponin, Acute on Ch Systolic CHF Currently being monitored in ICU stepdown Cardiology evaluation ongoing.  2D echo 6/22 shows severely depressed LVEF to 20-25%, severely reduced rt ventricle systolic function Cardiac cath- severe multi vessel disease  # Mild hyperkalemia Improved with hemodialysis    LOS: [place] 6/26/20209:09 [place], [place]

## 2018-11-20 NOTE — Progress Notes (Signed)
New hospice home referral received from [facility] following a  Palliative Medicine consult.  Patient is a 63 year old man with ESRD, last dialysis 6/24, which was stopped d/t hypotension, as well as several occluded cardiac arteries. He has chosen to for go any further dialysis and is not a candidate for cardiac catheterization. He and his family met with palliative NP [name] yesterday and again this morning. Patient has chosen to focus on comfort with transfer to the hospice home. Patient is currently receiving IV dilaudid for chest pain/dyspnea and requiring 4 liters of oxygen. Writer met in the room with [name] and his daughter [name] to initiate education regarding hospice home services. Patient seen sitting up with eyes closed, easily awakened to voice, grimace noted. When questioned about pain, patient reported sever pain. This was reported to staff RN [name], PRN Fentanyl given. Patient was then not able to participate fully in the conversation.  Writer then continued conversation with [name], hospice home visitation policy was discussed. [name] voiced understanding, she reports they have a very large family and that they will have a difficult time with that. Patient then woke up and reiterated his desire to "get some fresh air and get out of the hospital. No bed availability today plan si for discharge to the [facility] tomorrow if patient is stable. [name] voiced understanding. Hospital team updated. Writer will contact patient's HCPOA, his brother [name] to discuss consents and  Hospice home information. Patient information faxxd to referral. [name] BSN, RN, [facility] [phone]

## 2018-11-20 NOTE — TOC Progression Note (Addendum)
Transition of Care [facility]) - Progression Note    Patient Details  Name: CHAYNCE SCHAFER MRN: [id number] Date of Birth: August 30, 1955  [facility] CM/SW Contact  Elza Rafter, RN Phone Number: 11/20/2018, 10:03 AM  Clinical Narrative:   Referral to Santiago Glad, RN with [facility] for residential hospice facility.  Patient would like to discharge to the facility ASAP.  Family was here to visit yesterday.  EMS packet placed on chart for possible discharge today. Per Santiago Glad- does not need to have another COVID screen.      Expected Discharge Plan: Assisted Living    Expected Discharge Plan and Services Expected Discharge Plan: Assisted Living       Living arrangements for the past 2 months: Assisted Living Facility                                       Social Determinants of Health (SDOH) Interventions    Readmission Risk Interventions Readmission Risk Prevention Plan 11/18/2018 08/18/2018 04/24/2018  Transportation Screening Complete Complete Complete  HRI or Home Care Consult Complete - -  Palliative Care Screening Not Applicable - -  Medication Review (RN Care Manager) Complete Complete Complete  PCP or Specialist appointment within 3-5 days of discharge - Complete Complete  HRI or Home Care Consult - Complete Complete  SW Recovery Care/Counseling Consult - Complete Complete  Palliative Care Screening - Not Complete Not Complete  Comments - Was not ordered -  Medication Reconcilation (Pharmacy) - - Complete  [place] - Not Applicable Not Complete  Some recent data might be hidden

## 2018-11-20 NOTE — Progress Notes (Signed)
[place] [facility] [place] NAME: [name]    MR#:  [id number]  DATE OF BIRTH:  [birth year]  SUBJECTIVE:  CHIEF COMPLAINT:   Chief Complaint  Patient presents with  . Chest Pain  . Shortness of Breath   Admitted with chest pain and shortness of breath.  Noted to have elevated troponin.  Also noted to have low blood pressures in ER in stepdown unit. Patient still have chest pain on heparin drip.  Seen by cardiologist, status post cardiac catheterization.  Received hemodialysis per nephrology. Still have chest pain.  He had agreed to hospice and asking to just make him comfortable.  REVIEW OF SYSTEMS:  CONSTITUTIONAL: No fever, fatigue or weakness.  EYES: No blurred or double vision.  EARS, NOSE, AND THROAT: No tinnitus or ear pain.  RESPIRATORY: No cough, shortness of breath, wheezing or hemoptysis.  CARDIOVASCULAR: Have chest pain, no orthopnea, edema.  GASTROINTESTINAL: No nausea, vomiting, diarrhea or abdominal pain.  GENITOURINARY: No dysuria, hematuria.  ENDOCRINE: No polyuria, nocturia,  HEMATOLOGY: No anemia, easy bruising or bleeding SKIN: No rash or lesion. MUSCULOSKELETAL: No joint pain or arthritis.   NEUROLOGIC: No tingling, numbness, weakness.  PSYCHIATRY: No anxiety or depression.   ROS  DRUG ALLERGIES:   Allergies  Allergen Reactions  . Dust Mite Extract Other (See Comments)    Reaction: unknown    VITALS:  Blood pressure (!) 74/50, pulse 78, temperature 98.6 F (37 C), temperature source Oral, resp. rate 18, height 6\' 2"  (1.88 m), weight 96.2 kg, SpO2 100 %.  PHYSICAL EXAMINATION:  GENERAL:  63 y.o.-year-old patient lying in the bed with acute distress due to chest pain.  EYES: Pupils equal, round, reactive to light and accommodation. No scleral icterus. Extraocular muscles intact.  HEENT: Head atraumatic, normocephalic. Oropharynx and nasopharynx clear.  NECK:  Supple, no jugular venous distention. No thyroid  enlargement, no tenderness.  LUNGS: Normal breath sounds bilaterally, no wheezing, have crepitation. No use of accessory muscles of respiration.  CARDIOVASCULAR: S1, S2 normal. No murmurs, rubs, or gallops.  ABDOMEN: Soft, nontender, nondistended. Bowel sounds present. No organomegaly or mass.  EXTREMITIES: No pedal edema, cyanosis, or clubbing.  NEUROLOGIC: Cranial nerves II through XII are intact. Muscle strength 3-4/5 in all extremities. Sensation intact. Gait not checked.  PSYCHIATRIC: The patient is alert and oriented x 3.  SKIN: No obvious rash, lesion, or ulcer.   Physical Exam LABORATORY PANEL:   CBC Recent Labs  Lab 11/19/18 0406  WBC 9.8  HGB 12.7*  HCT 40.0  PLT 154   ------------------------------------------------------------------------------------------------------------------  Chemistries  Recent Labs  Lab 11/15/18 2117  11/19/18 0406  NA 143   < > 139  K 5.3*   < > 4.5  CL 101   < > 98  CO2 27   < > 26  GLUCOSE 121*   < > 91  BUN 62*   < > 52*  CREATININE 10.60*   < > 8.63*  CALCIUM 10.1   < > 9.4  AST 34  --   --   ALT 34  --   --   ALKPHOS 139*  --   --   BILITOT 0.6  --   --    < > = values in this interval not displayed.   ------------------------------------------------------------------------------------------------------------------  Cardiac Enzymes Recent Labs  Lab 11/15/18 2117 11/16/18 1639  TROPONINI 0.96* 8.28*   ------------------------------------------------------------------------------------------------------------------  RADIOLOGY:  Dg Chest 2 View  Result Date: 11/19/2018 CLINICAL  DATA:  Fever and shortness of breath. EXAM: CHEST - 2 VIEW COMPARISON:  11/15/2018 and 11/17/2018 FINDINGS: Stable marked cardiac enlargement and stable tortuosity and calcification of the thoracic aorta. Mild vascular congestion without overt pulmonary edema. There are persistent small bilateral pleural effusions with overlying atelectasis.  IMPRESSION: Stable cardiac enlargement and vascular congestion without overt pulmonary edema. Persistent small bilateral pleural effusions and bibasilar atelectasis. Electronically Signed   By: Marijo Sanes M.D.   On: 11/19/2018 09:28    ASSESSMENT AND PLAN:   Active Problems:   NSTEMI (non-ST elevated myocardial infarction) (HCC)   Fluid overload  1.  Non-STEMI.   Initially was admitted to stepdown unit.    Remains stable.  on aspirin and IV heparin.   statin therapy  on PRN IV morphine sulfate and sublingual nitroglycerin.  With stabilize blood pressure Nitropaste can be added. Hold beta-blockers due to low blood pressure.  2D echo and a cardiology consultation- Cardiac cath done which again showed significant coronary artery disease and multiple blockages but due to patient's severe cardiomyopathy and end-stage renal disease he is not a candidate for surgical intervention and cardiology suggested to continue aggressive medical management which is limited due to patient's persistent low blood pressure. Meanwhile he continued to have significant chest pain, called palliative care to discuss the goals of care and patient has agreed for comfort measures and hospice involvement.  2.  Acute on chronic systolic CHF.  Most recent EF was 20 to 25% in  mildly dilated left and right atria with trivial aortic regurgitation.  The patient will be diuresed with IV Lasix.  Nephrology consultation obtained for hemodialysis.  Received hemodialysis. Now refusing for hemodialysis and asking just to be comfortable.  3.  End-stage renal disease on hemodialysis.  Nephrology consult continue his Renvela.  4.  Dyslipidemia.  Statin therapy will be resumed.  5.  GERD.  PPI therapy will be resumed.  6.  COPD.  We will continue Spiriva and place the patient on PRN duo nebs.   7.  DVT prophylaxis.  The patient is on IV heparin for his ACS.  8.  Hypotension- normal saline bolus 500 mL.   Blood pressure  improved and stable.   All the records are reviewed and case discussed with Care Management/Social Workerr. Management plans discussed with the patient, family and they are in agreement.  CODE STATUS: Full code  TOTAL TIME TAKING CARE OF THIS PATIENT: 25 minutes.  Plan is to discharge to hospice home tomorrow if stable.  POSSIBLE D/C IN 1-2 DAYS, DEPENDING ON CLINICAL CONDITION.   Vaughan Basta M.D on 11/20/2018   Between 7am to 6pm - Pager - [phone]  After 6pm go to [email or web address] - password [email or web address] [facility]  Office  [phone]  CC: Primary care physician; Donnie Coffin, MD  Note: This dictation was prepared with Dragon dictation along with smaller phrase technology. Any transcriptional errors that result from this process are unintentional.

## 2018-11-21 ENCOUNTER — Encounter: Payer: Self-pay | Admitting: *Deleted

## 2018-11-21 LAB — HEPATITIS PANEL, ACUTE
HCV Ab: >11 s/co ratio — ABNORMAL HIGH (ref 0.0–0.9)
Hep A IgM: NEGATIVE
Hep B C IgM: NEGATIVE
Hepatitis B Surface Ag: NEGATIVE

## 2018-11-21 MED ORDER — GLYCOPYRROLATE 1 MG PO TABS: 1.0000 mg | ORAL_TABLET | ORAL | Status: AC | PRN

## 2018-11-21 MED ORDER — MORPHINE SULFATE (CONCENTRATE) 10 MG/0.5ML PO SOLN: 10.0000 mg | ORAL | 0 refills | Status: AC | PRN

## 2018-11-21 MED ORDER — LORAZEPAM 1 MG PO TABS: 1.0000 mg | ORAL_TABLET | ORAL | 0 refills | Status: AC | PRN

## 2018-11-21 NOTE — Discharge Summary (Signed)
[facility] NAME: [name]    MR#:  [id number]  DATE OF BIRTH:  1955-12-28  DATE OF ADMISSION:  11/15/2018 ADMITTING PHYSICIAN: Vaughan Basta, MD  DATE OF DISCHARGE: 11/21/2018  PRIMARY CARE PHYSICIAN: Donnie Coffin, MD    ADMISSION DIAGNOSIS:  Chest pain [R07.9] Chest pain in adult [R07.9] NSTEMI (non-ST elevated myocardial infarction) ([place]) [I21.4]  DISCHARGE DIAGNOSIS:  Active Problems:   NSTEMI (non-ST elevated myocardial infarction) (HCC)   Fluid overload   SECONDARY DIAGNOSIS:   Past Medical History:  Diagnosis Date  . 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 Amputee, below knee, left ([place])   . Anemia   . Asthma   . Cardiomyopathy ([place])   . CHF (congestive heart failure) ([place])   . Chronic systolic heart failure ([place])   . Complication of anesthesia    hypotension  . COPD (chronic obstructive pulmonary disease) ([place])   . Coronary artery disease   . Dialysis patient ([place])    Mon, Wed, Fri  . End stage renal disease ([place])   . GERD (gastroesophageal reflux disease)   . Headache   . History of kidney stones   . History of pulmonary embolism   . HLD (hyperlipidemia)   . HTN (hypertension)   . Hyperparathyroidism   . Myocardial infarction ([place])   . Peripheral vascular disease ([place])   . Shortness of breath dyspnea   . Sleep apnea    NO C-PAP, Patient stated in process of  "getting one"   . Tobacco dependence     HOSPITAL COURSE:   63 year old male with past medical history of ESRD on hemodialysis, hypertension, hyperlipidemia, history of pulmonary embolism, peripheral vascular disease, GERD, chronic systolic CHF, status post left below the knee amputation who presented to the hospital due to shortness of breath.  1. Non-STEMI  - Initially was admitted to stepdown unit on ASA, heparin gtt and statin.  - BP meds held due to hypotension.  Cardiac cath done which again showed  significant coronary artery disease and multiple blockages but due to patient's severe cardiomyopathy and end-stage renal disease he is not a candidate for surgical intervention and cardiology suggested to continue aggressive medical management which is limited due to patient's persistent low blood pressure. -Patient continued to be significantly symptomatic with chest pain and therefore palliative care consult was obtained to discuss goals of care.  Patient's family and patient did not want to pursue any further aggressive medical management and wanted patient to be comfortable.  Patient was therefore made comfort care only. - Currently being maintained on pain control with morphine.  2. Acute on chronic systolic CHF.Most recent EF was 20 to 25% in  mildly dilated left and right atria with trivial aortic regurgitation.  -Patient did receive some IV diuresis and also received hemodialysis while in the hospital but since she has been changed to comfort care only dialysis has been stopped.  Patient clinically is not in congestive heart failure presently.  3. End-stage renal disease on hemodialysis. 4. Dyslipidemia. 5. GERD.  6. COPD.  7. Chronic Hypotension.  8.  PVD s/p left sided BKA  Due to patient's significant comorbidities and poor prognosis patient is being discharged to hospice home for further care.  Patient's family and patient are in agreement. -Continue glycopyrrolate, Ativan, morphine.   DISCHARGE CONDITIONS:   Stable  CONSULTS OBTAINED:  Treatment Team:  Vaughan Basta, MD Yolonda Kida, MD  DRUG  ALLERGIES:   Allergies  Allergen Reactions  . Dust Mite Extract Other (See Comments)    Reaction: unknown    DISCHARGE MEDICATIONS:   Allergies as of 11/21/2018      Reactions   Dust Mite Extract Other (See Comments)   Reaction: unknown      Medication List    STOP taking these medications   albuterol (2.5 MG/3ML) 0.083% nebulizer  solution Commonly known as: PROVENTIL   albuterol 108 (90 Base) MCG/ACT inhaler Commonly known as: VENTOLIN HFA   apixaban 5 MG Tabs tablet Commonly known as: ELIQUIS   aspirin EC 325 MG tablet   atorvastatin 10 MG tablet Commonly known as: LIPITOR   budesonide-formoterol 160-4.5 MCG/ACT inhaler Commonly known as: SYMBICORT   calcium acetate 667 MG capsule Commonly known as: PHOSLO   carvedilol 6.25 MG tablet Commonly known as: COREG   cetirizine 10 MG tablet Commonly known as: ZYRTEC   Combivent Respimat 20-100 MCG/ACT Aers respimat Generic drug: Ipratropium-Albuterol   docusate sodium 100 MG capsule Commonly known as: COLACE   erythromycin ophthalmic ointment   fluticasone 50 MCG/ACT nasal spray Commonly known as: FLONASE   hydrocortisone 2.5 % cream   isosorbide mononitrate 30 MG 24 hr tablet Commonly known as: IMDUR   midodrine 10 MG tablet Commonly known as: PROAMATINE   nitroGLYCERIN 0.4 MG SL tablet Commonly known as: NITROSTAT   pantoprazole 40 MG tablet Commonly known as: PROTONIX   polyethylene glycol 17 g packet Commonly known as: MIRALAX / GLYCOLAX   Robafen 100 MG/5ML syrup Generic drug: guaifenesin   sevelamer carbonate 800 MG tablet Commonly known as: RENVELA   tiotropium 18 MCG inhalation capsule Commonly known as: SPIRIVA   traMADol 50 MG tablet Commonly known as: ULTRAM     TAKE these medications   acetaminophen 500 MG tablet Commonly known as: TYLENOL Take 500 mg by mouth every 6 (six) hours as needed for mild pain.   glycopyrrolate 1 MG tablet Commonly known as: ROBINUL Take 1 tablet (1 mg total) by mouth every 4 (four) hours as needed (excessive secretions).   LORazepam 1 MG tablet Commonly known as: ATIVAN Take 1 tablet (1 mg total) by mouth every 4 (four) hours as needed for up to 14 days for anxiety.   morphine CONCENTRATE 10 MG/0.5ML Soln concentrated solution Take 0.5 mLs (10 mg total) by mouth every 2 (two)  hours as needed for severe pain.   pregabalin 75 MG capsule Commonly known as: LYRICA Take 1 capsule (75 mg total) by mouth daily.   THERA-M PO Take 1 tablet by mouth 2 (two) times daily.         DISCHARGE INSTRUCTIONS:   DIET:  Regular diet  DISCHARGE CONDITION:  Stable  ACTIVITY:  Activity as tolerated  OXYGEN:  Home Oxygen: Yes.     Oxygen Delivery: 2 liters/min via Patient connected to nasal cannula oxygen  DISCHARGE LOCATION:  Hospice Home   If you experience worsening of your admission symptoms, develop shortness of breath, life threatening emergency, suicidal or homicidal thoughts you must seek medical attention immediately by calling 911 or calling your MD immediately  if symptoms less severe.  You Must read complete instructions/literature along with all the possible adverse reactions/side effects for all the Medicines you take and that have been prescribed to you. Take any new Medicines after you have completely understood and accpet all the possible adverse reactions/side effects.   Please note  You were cared for by a hospitalist during your hospital  stay. If you have any questions about your discharge medications or the care you received while you were in the hospital after you are discharged, you can call the unit and asked to speak with the hospitalist on call if the hospitalist that took care of you is not available. Once you are discharged, your primary care physician will handle any further medical issues. Please note that NO REFILLS for any discharge medications will be authorized once you are discharged, as it is imperative that you return to your primary care physician (or establish a relationship with a primary care physician if you do not have one) for your aftercare needs so that they can reassess your need for medications and monitor your lab values.     Today   No acute events overnight.  Patient currently denies any worsening shortness of  breath or chest pain.  Remains hypotensive but is asymptomatic.  VITAL SIGNS:  Blood pressure 125/87, pulse 88, temperature 98.6 F (37 C), temperature source Oral, resp. rate 20, height 6\' 2"  (1.88 m), weight 96.2 kg, SpO2 93 %.  I/O:    Intake/Output Summary (Last 24 hours) at 11/21/2018 1051 Last data filed at 11/21/2018 0942 Gross per 24 hour  Intake 247 ml  Output 0 ml  Net 247 ml    PHYSICAL EXAMINATION:  GENERAL:  63 y.o.-year-old obese patient lying in the bed in no acute distress.  EYES: Pupils equal, round, reactive to light and accommodation. No scleral icterus. Extraocular muscles intact.  HEENT: Head atraumatic, normocephalic. Oropharynx and nasopharynx clear.  NECK:  Supple, no jugular venous distention. No thyroid enlargement, no tenderness.  LUNGS: Normal breath sounds bilaterally, no wheezing, rales,rhonchi. No use of accessory muscles of respiration.  CARDIOVASCULAR: S1, S2 normal. No murmurs, rubs, or gallops.  ABDOMEN: Soft, non-tender, non-distended. Bowel sounds present. No organomegaly or mass.  EXTREMITIES: No pedal edema, cyanosis, or clubbing. Right BKA.  NEUROLOGIC: Cranial nerves II through XII are intact. No focal motor or sensory defecits b/l. Globally weak.  PSYCHIATRIC: The patient is alert and oriented x 3.   SKIN: No obvious rash, lesion, or ulcer.   Left upper ext. AV fistula with good bruit, thrill.   DATA REVIEW:   CBC Recent Labs  Lab 11/19/18 0406  WBC 9.8  HGB 12.7*  HCT 40.0  PLT 154    Chemistries  Recent Labs  Lab 11/15/18 2117  11/19/18 0406  NA 143   < > 139  K 5.3*   < > 4.5  CL 101   < > 98  CO2 27   < > 26  GLUCOSE 121*   < > 91  BUN 62*   < > 52*  CREATININE 10.60*   < > 8.63*  CALCIUM 10.1   < > 9.4  AST 34  --   --   ALT 34  --   --   ALKPHOS 139*  --   --   BILITOT 0.6  --   --    < > = values in this interval not displayed.    Cardiac Enzymes Recent Labs  Lab 11/16/18 1639  TROPONINI 8.28*     Microbiology Results  Results for orders placed or performed during the hospital encounter of 11/15/18  Blood culture (routine x 2)     Status: None   Collection Time: 11/15/18  9:18 PM   Specimen: BLOOD  Result Value Ref Range Status   Specimen Description BLOOD RIGHT ANTECUBITAL  Final   Special  Requests   Final    BOTTLES DRAWN AEROBIC AND ANAEROBIC Blood Culture adequate volume   Culture   Final    NO GROWTH 5 DAYS Performed at [facility], [address] [place], [place] [address]    Report Status 11/20/2018 FINAL  Final  Blood culture (routine x 2)     Status: None   Collection Time: 11/15/18  9:19 PM   Specimen: BLOOD  Result Value Ref Range Status   Specimen Description BLOOD BLOOD RIGHT HAND  Final   Special Requests   Final    BOTTLES DRAWN AEROBIC AND ANAEROBIC Blood Culture adequate volume   Culture   Final    NO GROWTH 5 DAYS Performed at [facility], [address] [place], [place] [address]    Report Status 11/20/2018 FINAL  Final  SARS Coronavirus 2 (CEPHEID - Performed in [facility] hospital lab), Hosp Order     Status: None   Collection Time: 11/15/18  9:35 PM   Specimen: Nasopharyngeal Swab  Result Value Ref Range Status   SARS Coronavirus 2 NEGATIVE NEGATIVE Final    Comment: (NOTE) If result is NEGATIVE SARS-CoV-2 target nucleic acids are NOT DETECTED. The SARS-CoV-2 RNA is generally detectable in upper and lower  respiratory specimens during the acute phase of infection. The lowest  concentration of SARS-CoV-2 viral copies this assay can detect is 250  copies / mL. A negative result does not preclude SARS-CoV-2 infection  and should not be used as the sole basis for treatment or other  patient management decisions.  A negative result may occur with  improper specimen collection / handling, submission of specimen other  than nasopharyngeal swab, presence of viral mutation(s) within the  areas targeted by this assay, and  inadequate number of viral copies  (<250 copies / mL). A negative result must be combined with clinical  observations, patient history, and epidemiological information. If result is POSITIVE SARS-CoV-2 target nucleic acids are DETECTED. The SARS-CoV-2 RNA is generally detectable in upper and lower  respiratory specimens dur ing the acute phase of infection.  Positive  results are indicative of active infection with SARS-CoV-2.  Clinical  correlation with patient history and other diagnostic information is  necessary to determine patient infection status.  Positive results do  not rule out bacterial infection or co-infection with other viruses. If result is PRESUMPTIVE POSTIVE SARS-CoV-2 nucleic acids MAY BE PRESENT.   A presumptive positive result was obtained on the submitted specimen  and confirmed on repeat testing.  While 2019 novel coronavirus  (SARS-CoV-2) nucleic acids may be present in the submitted sample  additional confirmatory testing may be necessary for epidemiological  and / or clinical management purposes  to differentiate between  SARS-CoV-2 and other Sarbecovirus currently known to infect humans.  If clinically indicated additional testing with an alternate test  methodology [phone]) is advised. The SARS-CoV-2 RNA is generally  detectable in upper and lower respiratory sp ecimens during the acute  phase of infection. The expected result is Negative. Fact Sheet for Patients:  StrictlyIdeas.no Fact Sheet for Healthcare Providers: BankingDealers.co.za This test is not yet approved or cleared by the [place] FDA and has been authorized for detection and/or diagnosis of SARS-CoV-2 by FDA under an Emergency Use Authorization (EUA).  This EUA will remain in effect (meaning this test can be used) for the duration of the COVID-19 declaration under Section 564(b)(1) of the Act, 21 U.S.C. section 360bbb-3(b)(1), unless the  authorization is terminated or  revoked sooner. Performed at [facility], [address] [place], [place] [address]   MRSA PCR Screening     Status: Abnormal   Collection Time: 11/16/18  9:39 AM   Specimen: Nasopharyngeal  Result Value Ref Range Status   MRSA by PCR POSITIVE (A) NEGATIVE Final    Comment:        The GeneXpert MRSA Assay (FDA approved for NASAL specimens only), is one component of a comprehensive MRSA colonization surveillance program. It is not intended to diagnose MRSA infection nor to guide or monitor treatment for MRSA infections. RESULT CALLED TO, READ BACK BY AND VERIFIED WITH: JAMIE DEEM @1150  11/16/18 AKT Performed at [facility], [address], [place], [place] [address]     RADIOLOGY:  No results found.    Management plans discussed with the patient, family and they are in agreement.  CODE STATUS:     Code Status Orders  (From admission, onward)         Start     Ordered   11/19/18 1435  Do not attempt resuscitation (DNR)  Continuous    Question Answer Comment  In the event of cardiac or respiratory ARREST Do not call a "code blue"   In the event of cardiac or respiratory ARREST Do not perform Intubation, CPR, defibrillation or ACLS   In the event of cardiac or respiratory ARREST Use medication by any route, position, wound care, and other measures to relive pain and suffering. May use oxygen, suction and manual treatment of airway obstruction as needed for comfort.   Comments MOST form in chart      11/19/18 1438         TOTAL TIME TAKING CARE OF THIS PATIENT: 40 minutes.    Henreitta Leber M.D on 11/21/2018 at 10:51 AM  Between 7am to 6pm - Pager - [phone]  After 6pm go to [email or web address] - [employer] bre[employer] [facility]  Office  [phone]  CC: Primary care physician; Donnie Coffin, MD

## 2018-11-21 NOTE — TOC Transition Note (Signed)
Transition of Care [facility] - CM/SW Discharge Note   Patient Details  Name: [name] MRN: [id number] Date of Birth: [birth year]  Transition of Care [facility]) CM/SW Contact:  Latanya Maudlin, RN Phone Number: 11/21/2018, 11:17 AM   Clinical Narrative:  Patient medically cleared to discharge to hospice facility. Spoke with Jackelyn Poling, admission coordinator of residential hospice at [facility] Patient can be received today at their facility. Spoke with brother, [name] who is the POA and he is comfortable with transfer. Yesterday discharge was delayed due to safety over transport relating to the patients low bllod pressure. Per MD he feels patient is stable enough to move today and more recent BP check was in the 716'R systolically opposed to its previous documentation in the 60's. Patient will transfer via EMS after bedside RN is able to call report.       Final next level of care: [place] Barriers to Discharge: No Barriers Identified   Patient Goals and CMS Choice Patient states their goals for this hospitalization and ongoing recovery are:: I want to go to the Hospice Home ASAP CMS Medicare.gov Compare Post Acute Care list provided to:: Patient Represenative (must comment)(POA Tommy) Choice offered to / list presented to : [facility] POA / [place]  Discharge Placement                Patient to be transferred to facility by: EMS Name of family member notified: Tommy Patient and family notified of of transfer: 11/21/18  Discharge Plan and Services                                     Social Determinants of Health (SDOH) Interventions     Readmission Risk Interventions Readmission Risk Prevention Plan 11/21/2018 11/18/2018 08/18/2018  Transportation Screening Complete Complete Complete  HRI or Home Care Consult - Complete -  Palliative Care Screening - Not Applicable -  Medication Review (RN Care Manager) Complete Complete Complete  PCP or Specialist appointment  within 3-5 days of discharge Not Complete - Complete  PCP/Specialist Appt Not Complete comments dc hospice - -  HRI or Home Care Consult Patient refused - Complete  SW Recovery Care/Counseling Consult Complete - Complete  Palliative Care Screening Complete - Not Complete  Comments - - Was not ordered  Medication Reconcilation ([place] Complete - Not Applicable  Some recent data might be hidden

## 2018-11-21 NOTE — Progress Notes (Signed)
Pt to be discharged to the hospice home today. Report called to dan at the facility. Awaiting ems transport.

## 2018-11-21 NOTE — Plan of Care (Signed)
  Problem: Coping: Goal: Level of anxiety will decrease Outcome: Progressing   Problem: Elimination: Goal: Will not experience complications related to urinary retention Outcome: Progressing Note: Pt no longer makes urine   Problem: Safety: Goal: Ability to remain free from injury will improve Outcome: Progressing   Problem: Skin Integrity: Goal: Risk for impaired skin integrity will decrease Outcome: Progressing   Problem: Pain Managment: Goal: General experience of comfort will improve Outcome: Not Progressing Note: Pt continues to have pain treated with fentanyl

## 2018-11-25 DEATH — deceased

## 2018-12-14 NOTE — Progress Notes (Deleted)
Patient ID: JAIKOB BORGWARDT, male    DOB: [birth year], 63 y.o.   MRN: [id number]  HPI  Mr Shukla is a 63 y/o male with a history of asthma, CAD, hyperlipidemia, HTN, CKD (ESRD on dialysis), anemia, COPD, GERD, PVD, MI, obstructive sleep apnea, left BKA, current tobacco use and chronic heart failure.   Echo report from 11/16/2018 reviewed and showed an EF of 20-25%. Echo report from 08/10/2018 reviewed and showed an EF of 20-25%. Echo report from 07/23/17 reviewed and showed an EF of 20-25%.  Admitted 11/15/2018 due to Cardiology and palliative care consults were obtained. Discharged after 6 days. Was in the ED 09/20/2018 due to chest pain where he was treated and released. Admitted 08/10/2018 due to NSTEMI. Given IV heparin but then needed IV protonix after stool hemoccult came back positive. GI and cardiology consults obtained. Given PRBC infusion. Initially hypotensive.  Discharged after 8 days. Was in the ED 07/22/2018 due to nosebleed that wouldn't stop. Given Afrin but bleeding continued. Had to place merocel sponge and given antibiotics and then released with ENT follow-up.   He presents today for a follow-up visit with a chief complaint of moderate shortness of breath upon minimal exertion. He describes this as chronic in nature having been present for several years. He has associated fatigue, weakness and continued chest pain. He denies any difficulty sleeping, abdominal distention, palpitations, pedal edema or dizziness. Continues with dialysis on M, W, F. Wearing oxygen at 3L at bedtime and during the day as needed.     Past Medical History:  Diagnosis Date  . 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 Amputee, below knee, left ([place])   . Anemia   . Asthma   . Cardiomyopathy ([place])   . CHF (congestive heart failure) ([place])   . Chronic systolic heart failure ([place])   . Complication of anesthesia    hypotension  . COPD (chronic obstructive pulmonary disease) ([place])   . Coronary artery  disease   . Dialysis patient ([place])    Mon, Wed, Fri  . End stage renal disease ([place])   . GERD (gastroesophageal reflux disease)   . Headache   . History of kidney stones   . History of pulmonary embolism   . HLD (hyperlipidemia)   . HTN (hypertension)   . Hyperparathyroidism   . Myocardial infarction ([place])   . Peripheral vascular disease ([place])   . Shortness of breath dyspnea   . Sleep apnea    NO C-PAP, Patient stated in process of  "getting one"   . Tobacco dependence    Past Surgical History:  Procedure Laterality Date  . A/V FISTULAGRAM Left 08/14/2017   Procedure: A/V FISTULAGRAM;  Surgeon: Algernon Huxley, MD;  Location: [facility];  Service: Cardiovascular;  Laterality: Left;  . AMPUTATION Left 05/06/2014   Procedure: AMPUTATION BELOW KNEE;  Surgeon: Elam Dutch, MD;  Location: [place];  Service: Vascular;  Laterality: Left;  . AMPUTATION Right 01/12/2015   Procedure: Foot transmetatarsal amputation;  Surgeon: Algernon Huxley, MD;  Location: [facility];  Service: Vascular;  Laterality: Right;  . APPLICATION OF WOUND VAC Right 03/01/2015   Procedure: Application of Bio-connekt graft and wound vac application to right foot ;  Surgeon: Algernon Huxley, MD;  Location: [facility];  Service: Vascular;  Laterality: Right;  . AV FISTULA PLACEMENT Left   . AV FISTULA PLACEMENT Left 11/28/2016   Procedure: ARTERIOVENOUS (AV) FISTULA CREATION;  Surgeon: Lucky Cowboy,  Erskine Squibb, MD;  Location: [facility];  Service: Vascular;  Laterality: Left;  . CARDIAC CATHETERIZATION     stent placement   . CORONARY ANGIOPLASTY    . DIALYSIS/PERMA CATHETER INSERTION N/A 07/31/2016   Procedure: Dialysis/Perma Catheter Insertion;  Surgeon: Algernon Huxley, MD;  Location: [facility];  Service: Cardiovascular;  Laterality: N/A;  . DIALYSIS/PERMA CATHETER INSERTION N/A 07/25/2017   Procedure: DIALYSIS/PERMA CATHETER INSERTION and fistulagram;  Surgeon: Algernon Huxley, MD;  Location: [facility];  Service:  Cardiovascular;  Laterality: N/A;  . DIALYSIS/PERMA CATHETER REMOVAL N/A 07/22/2017   Procedure: DIALYSIS/PERMA CATHETER REMOVAL;  Surgeon: Katha Cabal, MD;  Location: [facility];  Service: Cardiovascular;  Laterality: N/A;  . DIALYSIS/PERMA CATHETER REMOVAL N/A 02/05/2018   Procedure: DIALYSIS/PERMA CATHETER REMOVAL;  Surgeon: Algernon Huxley, MD;  Location: [facility];  Service: Cardiovascular;  Laterality: N/A;  . IR FLUORO GUIDE CV LINE LEFT  04/10/2017  . LEFT HEART CATH AND CORONARY ANGIOGRAPHY N/A 08/11/2018   Procedure: LEFT HEART CATH AND CORONARY ANGIOGRAPHY;  Surgeon: Corey Skains, MD;  Location: [facility];  Service: Cardiovascular;  Laterality: N/A;  . LEFT HEART CATH AND CORONARY ANGIOGRAPHY N/A 11/19/2018   Procedure: LEFT HEART CATH AND CORONARY ANGIOGRAPHY and possible pci;  Surgeon: Yolonda Kida, MD;  Location: [facility];  Service: Cardiovascular;  Laterality: N/A;  . LIGATION OF ARTERIOVENOUS  FISTULA Right 01/31/2016   Procedure: LIGATION OF ARTERIOVENOUS  FISTULA;  Surgeon: Algernon Huxley, MD;  Location: [facility];  Service: Vascular;  Laterality: Right;  . PERIPHERAL VASCULAR CATHETERIZATION Right 12/15/2014   Procedure: Lower Extremity Angiography;  Surgeon: Algernon Huxley, MD;  Location: [facility];  Service: Cardiovascular;  Laterality: Right;  . PERIPHERAL VASCULAR CATHETERIZATION  12/15/2014   Procedure: Lower Extremity Intervention;  Surgeon: Algernon Huxley, MD;  Location: [facility];  Service: Cardiovascular;;  . PERIPHERAL VASCULAR CATHETERIZATION Right 08/14/2015   Procedure: A/V Shuntogram/Fistulagram;  Surgeon: Algernon Huxley, MD;  Location: [facility];  Service: Cardiovascular;  Laterality: Right;  . PERIPHERAL VASCULAR CATHETERIZATION N/A 08/14/2015   Procedure: A/V Shunt Intervention;  Surgeon: Algernon Huxley, MD;  Location: [facility] [facility];  Service: Cardiovascular;  Laterality: N/A;  .  PERIPHERAL VASCULAR CATHETERIZATION N/A 01/11/2016   Procedure: Dialysis/Perma Catheter Insertion;  Surgeon: Algernon Huxley, MD;  Location: [facility];  Service: Cardiovascular;  Laterality: N/A;  . REVISON OF ARTERIOVENOUS FISTULA Right 02/17/2016   Procedure: removal of AV fistula;  Surgeon: Serafina Mitchell, MD;  Location: [facility];  Service: Vascular;  Laterality: Right;  . REVISON OF ARTERIOVENOUS FISTULA Right 01/31/2016   Procedure: REVISON OF ARTERIOVENOUS FISTULA ( BRACHIOCEPHALIC ) W/ ARTEGRAFT;  Surgeon: Algernon Huxley, MD;  Location: [facility];  Service: Vascular;  Laterality: Right;  . TRANSMETATARSAL AMPUTATION Right 05/04/2015   Procedure: TRANSMETATARSAL AMPUTATION REVISION, great toe amputation;  Surgeon: Algernon Huxley, MD;  Location: [facility];  Service: Vascular;  Laterality: Right;   Family History  Problem Relation Age of Onset  . Leukemia Mother   . Heart attack Father   . Heart failure Other   . Hypertension Other   . Leukemia Other   . Diabetes Other   . Prostate cancer Neg Hx   . Kidney cancer Neg Hx   . Bladder Cancer Neg Hx    Social History   Tobacco Use  .  Smoking status: Light Tobacco Smoker    Packs/day: 0.25    Years: 30.00    Pack years: 7.50    Types: Cigarettes  . Smokeless tobacco: Never Used  . Tobacco comment: 5  Substance Use Topics  . Alcohol use: No    Alcohol/week: 0.0 standard drinks   Allergies  Allergen Reactions  . Dust Mite Extract Other (See Comments)    Reaction: unknown     Review of Systems  Constitutional: Positive for fatigue (minimal). Negative for appetite change.  HENT: Negative for congestion, postnasal drip and sore throat.   Eyes: Negative for visual disturbance.  Respiratory: Positive for shortness of breath (with minimal exertion). Negative for chest tightness.   Cardiovascular: Positive for chest pain. Negative for palpitations.  Gastrointestinal: Negative for abdominal distention and abdominal pain.  Endocrine:  Negative.   Genitourinary: Negative.   Musculoskeletal: Positive for arthralgias (left arm pain). Negative for back pain.  Skin: Negative.   Allergic/Immunologic: Negative.   Neurological: Positive for weakness (right leg). Negative for dizziness and light-headedness.  Hematological: Negative for adenopathy. Does not bruise/bleed easily.  Psychiatric/Behavioral: Negative for dysphoric mood and sleep disturbance (wearing oxygen/ humidifier at bedtime). The patient is not nervous/anxious.      Physical Exam  Constitutional: He is oriented to person, place, and time. He appears well-developed and well-nourished.  HENT:  Head: Normocephalic and atraumatic.  Neck: Normal range of motion. Neck supple. No JVD present.  Cardiovascular: Normal rate and regular rhythm.  Pulmonary/Chest: Effort normal. He has no wheezes. He has no rales.  Abdominal: Soft. He exhibits no distension. There is no abdominal tenderness.  Musculoskeletal:        General: Edema (trace pitting in right lower leg) present. No tenderness.     Comments: No palpable chest tenderness  Neurological: He is alert and oriented to person, place, and time.  Skin: Skin is warm and dry.  Psychiatric: He has a normal mood and affect. His behavior is normal. Thought content normal.  Nursing note and vitals reviewed.  Assessment & Plan:  1: Chronic heart failure with reduced ejection fraction- - NYHA class III - euvolemic today - not being weighed due to left BKA and weakness in his right lower leg  - not adding salt to his food and he says that [employer] with salt either - saw cardiology Nehemiah Massed) 10/29/2018 - saw pulmonologist Ashby Dawes) 12/02/17 - due to history of hyperkalemia, doubtful we can use entresto - BNP on 08/04/2018 was 629.0 - wearing oxygen at 3L at bedtime and during the day as needed  2: HTN- - BP looks good - sees PCP Clide Deutscher) at [place] on 09/20/2018 reviewed and  showed sodium 139, potassium 3.7, creatinine 8.68 and GFR 7  3: ESRD- - receives dialysis M, W, F - saw vascular ([name]) 10/27/2018 - saw wound center Joaquim Lai) 09/10/2018  Facility medication list was reviewed.  Return in 4 months or sooner for any questions/problems before then.

## 2018-12-15 ENCOUNTER — Ambulatory Visit: Payer: Medicaid Other | Admitting: Family

## 2018-12-22 ENCOUNTER — Ambulatory Visit: Payer: Medicaid Other | Admitting: Gastroenterology

## 2019-03-02 ENCOUNTER — Ambulatory Visit: Payer: Medicaid Other | Admitting: Family

## 2019-11-02 ENCOUNTER — Encounter (INDEPENDENT_AMBULATORY_CARE_PROVIDER_SITE_OTHER): Payer: Self-pay

## 2019-11-02 ENCOUNTER — Ambulatory Visit (INDEPENDENT_AMBULATORY_CARE_PROVIDER_SITE_OTHER): Payer: Medicare Other | Admitting: Vascular Surgery

## 2019-11-02 ENCOUNTER — Encounter (INDEPENDENT_AMBULATORY_CARE_PROVIDER_SITE_OTHER): Payer: Medicare Other

## 2020-01-18 ENCOUNTER — Encounter (INDEPENDENT_AMBULATORY_CARE_PROVIDER_SITE_OTHER): Payer: Self-pay | Admitting: Vascular Surgery
